# Patient Record
Sex: Female | Born: 1965
Health system: Southern US, Community
[De-identification: ages and names within clinical notes are randomized; demographics above are authoritative.]

## PROBLEM LIST (undated history)

## (undated) DIAGNOSIS — C50919 Malignant neoplasm of unspecified site of unspecified female breast: Secondary | ICD-10-CM

## (undated) DIAGNOSIS — Z90722 Acquired absence of ovaries, bilateral: Secondary | ICD-10-CM

## (undated) DIAGNOSIS — F419 Anxiety disorder, unspecified: Secondary | ICD-10-CM

## (undated) DIAGNOSIS — N870 Mild cervical dysplasia: Secondary | ICD-10-CM

## (undated) DIAGNOSIS — R87619 Unspecified abnormal cytological findings in specimens from cervix uteri: Secondary | ICD-10-CM

## (undated) DIAGNOSIS — C801 Malignant (primary) neoplasm, unspecified: Secondary | ICD-10-CM

## (undated) DIAGNOSIS — H9190 Unspecified hearing loss, unspecified ear: Secondary | ICD-10-CM

## (undated) DIAGNOSIS — H905 Unspecified sensorineural hearing loss: Secondary | ICD-10-CM

## (undated) DIAGNOSIS — IMO0002 Reserved for concepts with insufficient information to code with codable children: Secondary | ICD-10-CM

## (undated) DIAGNOSIS — Z95828 Presence of other vascular implants and grafts: Secondary | ICD-10-CM

## (undated) DIAGNOSIS — F329 Major depressive disorder, single episode, unspecified: Secondary | ICD-10-CM

## (undated) DIAGNOSIS — IMO0001 Reserved for inherently not codable concepts without codable children: Secondary | ICD-10-CM

## (undated) DIAGNOSIS — F32A Depression, unspecified: Secondary | ICD-10-CM

## (undated) DIAGNOSIS — Z923 Personal history of irradiation: Secondary | ICD-10-CM

## (undated) HISTORY — DX: Malignant (primary) neoplasm, unspecified: C80.1

## (undated) HISTORY — DX: Mild cervical dysplasia: N87.0

## (undated) HISTORY — PX: MASTECTOMY: SHX3

## (undated) HISTORY — DX: Reserved for inherently not codable concepts without codable children: IMO0001

## (undated) HISTORY — PX: OVARIAN CYST REMOVAL: SHX89

## (undated) HISTORY — PX: ABDOMINAL HYSTERECTOMY: SHX81

## (undated) HISTORY — DX: Unspecified sensorineural hearing loss: H90.5

## (undated) HISTORY — DX: Reserved for concepts with insufficient information to code with codable children: IMO0002

## (undated) HISTORY — PX: TUBAL LIGATION: SHX77

## (undated) HISTORY — DX: Acquired absence of ovaries, bilateral: Z90.722

## (undated) HISTORY — DX: Unspecified abnormal cytological findings in specimens from cervix uteri: R87.619

---

## 1997-01-12 DIAGNOSIS — C801 Malignant (primary) neoplasm, unspecified: Secondary | ICD-10-CM

## 1997-01-12 HISTORY — DX: Malignant (primary) neoplasm, unspecified: C80.1

## 1997-07-02 ENCOUNTER — Encounter: Admission: RE | Admit: 1997-07-02 | Discharge: 1997-07-02 | Payer: Self-pay | Admitting: Family Medicine

## 1997-07-06 ENCOUNTER — Emergency Department (HOSPITAL_COMMUNITY): Admission: EM | Admit: 1997-07-06 | Discharge: 1997-07-06 | Payer: Self-pay | Admitting: Emergency Medicine

## 1997-07-13 ENCOUNTER — Encounter: Admission: RE | Admit: 1997-07-13 | Discharge: 1997-07-13 | Payer: Self-pay | Admitting: Family Medicine

## 1997-08-31 ENCOUNTER — Emergency Department (HOSPITAL_COMMUNITY): Admission: EM | Admit: 1997-08-31 | Discharge: 1997-09-01 | Payer: Self-pay | Admitting: Emergency Medicine

## 1997-09-04 ENCOUNTER — Emergency Department (HOSPITAL_COMMUNITY): Admission: EM | Admit: 1997-09-04 | Discharge: 1997-09-04 | Payer: Self-pay | Admitting: Emergency Medicine

## 1997-09-13 ENCOUNTER — Encounter: Admission: RE | Admit: 1997-09-13 | Discharge: 1997-09-13 | Payer: Self-pay | Admitting: Family Medicine

## 1997-11-13 ENCOUNTER — Other Ambulatory Visit: Admission: RE | Admit: 1997-11-13 | Discharge: 1997-11-13 | Payer: Self-pay | Admitting: *Deleted

## 1997-11-20 ENCOUNTER — Ambulatory Visit (HOSPITAL_COMMUNITY): Admission: RE | Admit: 1997-11-20 | Discharge: 1997-11-20 | Payer: Self-pay | Admitting: *Deleted

## 1997-11-30 ENCOUNTER — Inpatient Hospital Stay (HOSPITAL_COMMUNITY): Admission: RE | Admit: 1997-11-30 | Discharge: 1997-12-02 | Payer: Self-pay | Admitting: *Deleted

## 1997-12-05 ENCOUNTER — Emergency Department (HOSPITAL_COMMUNITY): Admission: EM | Admit: 1997-12-05 | Discharge: 1997-12-05 | Payer: Self-pay

## 1997-12-10 ENCOUNTER — Encounter (HOSPITAL_COMMUNITY): Admission: RE | Admit: 1997-12-10 | Discharge: 1998-03-10 | Payer: Self-pay | Admitting: Plastic Surgery

## 1998-01-09 ENCOUNTER — Ambulatory Visit (HOSPITAL_COMMUNITY): Admission: RE | Admit: 1998-01-09 | Discharge: 1998-01-09 | Payer: Self-pay | Admitting: *Deleted

## 1998-01-11 ENCOUNTER — Encounter: Payer: Self-pay | Admitting: Hematology and Oncology

## 1998-01-11 ENCOUNTER — Inpatient Hospital Stay (HOSPITAL_COMMUNITY): Admission: AD | Admit: 1998-01-11 | Discharge: 1998-01-12 | Payer: Self-pay | Admitting: Hematology and Oncology

## 1998-01-23 ENCOUNTER — Inpatient Hospital Stay (HOSPITAL_COMMUNITY): Admission: EM | Admit: 1998-01-23 | Discharge: 1998-01-25 | Payer: Self-pay | Admitting: Hematology and Oncology

## 1998-01-23 ENCOUNTER — Encounter: Payer: Self-pay | Admitting: Hematology and Oncology

## 1998-01-24 ENCOUNTER — Encounter: Payer: Self-pay | Admitting: Hematology and Oncology

## 1998-03-25 ENCOUNTER — Encounter: Payer: Self-pay | Admitting: Hematology and Oncology

## 1998-03-25 ENCOUNTER — Inpatient Hospital Stay (HOSPITAL_COMMUNITY): Admission: EM | Admit: 1998-03-25 | Discharge: 1998-03-27 | Payer: Self-pay | Admitting: Emergency Medicine

## 1998-06-25 ENCOUNTER — Encounter: Payer: Self-pay | Admitting: Hematology and Oncology

## 1998-06-25 ENCOUNTER — Ambulatory Visit (HOSPITAL_COMMUNITY): Admission: RE | Admit: 1998-06-25 | Discharge: 1998-06-25 | Payer: Self-pay | Admitting: Hematology and Oncology

## 1998-07-02 ENCOUNTER — Ambulatory Visit (HOSPITAL_COMMUNITY): Admission: RE | Admit: 1998-07-02 | Discharge: 1998-07-02 | Payer: Self-pay | Admitting: Hematology and Oncology

## 1998-07-02 ENCOUNTER — Encounter: Payer: Self-pay | Admitting: Hematology and Oncology

## 1998-07-23 ENCOUNTER — Ambulatory Visit (HOSPITAL_BASED_OUTPATIENT_CLINIC_OR_DEPARTMENT_OTHER): Admission: RE | Admit: 1998-07-23 | Discharge: 1998-07-23 | Payer: Self-pay | Admitting: *Deleted

## 1998-09-25 ENCOUNTER — Ambulatory Visit (HOSPITAL_BASED_OUTPATIENT_CLINIC_OR_DEPARTMENT_OTHER): Admission: RE | Admit: 1998-09-25 | Discharge: 1998-09-25 | Payer: Self-pay | Admitting: Plastic Surgery

## 1998-10-01 ENCOUNTER — Encounter (HOSPITAL_COMMUNITY): Admission: RE | Admit: 1998-10-01 | Discharge: 1998-12-30 | Payer: Self-pay | Admitting: Plastic Surgery

## 1998-11-22 ENCOUNTER — Encounter: Admission: RE | Admit: 1998-11-22 | Discharge: 1998-11-22 | Payer: Self-pay | Admitting: Hematology and Oncology

## 1998-11-22 ENCOUNTER — Encounter: Payer: Self-pay | Admitting: Hematology and Oncology

## 1998-12-18 ENCOUNTER — Encounter: Admission: RE | Admit: 1998-12-18 | Discharge: 1998-12-18 | Payer: Self-pay | Admitting: Family Medicine

## 1998-12-18 ENCOUNTER — Other Ambulatory Visit: Admission: RE | Admit: 1998-12-18 | Discharge: 1999-01-27 | Payer: Self-pay | Admitting: *Deleted

## 1999-01-01 ENCOUNTER — Encounter: Admission: RE | Admit: 1999-01-01 | Discharge: 1999-01-01 | Payer: Self-pay | Admitting: Family Medicine

## 1999-01-23 ENCOUNTER — Encounter: Admission: RE | Admit: 1999-01-23 | Discharge: 1999-01-23 | Payer: Self-pay | Admitting: Hematology and Oncology

## 1999-01-23 ENCOUNTER — Encounter: Payer: Self-pay | Admitting: Hematology and Oncology

## 1999-01-27 ENCOUNTER — Encounter: Admission: RE | Admit: 1999-01-27 | Discharge: 1999-01-27 | Payer: Self-pay | Admitting: Family Medicine

## 1999-02-06 ENCOUNTER — Ambulatory Visit (HOSPITAL_BASED_OUTPATIENT_CLINIC_OR_DEPARTMENT_OTHER): Admission: RE | Admit: 1999-02-06 | Discharge: 1999-02-06 | Payer: Self-pay | Admitting: *Deleted

## 1999-02-06 ENCOUNTER — Encounter (INDEPENDENT_AMBULATORY_CARE_PROVIDER_SITE_OTHER): Payer: Self-pay | Admitting: Specialist

## 1999-02-15 ENCOUNTER — Emergency Department (HOSPITAL_COMMUNITY): Admission: EM | Admit: 1999-02-15 | Discharge: 1999-02-15 | Payer: Self-pay | Admitting: Emergency Medicine

## 1999-02-15 ENCOUNTER — Encounter: Payer: Self-pay | Admitting: Emergency Medicine

## 1999-03-12 ENCOUNTER — Ambulatory Visit (HOSPITAL_COMMUNITY): Admission: RE | Admit: 1999-03-12 | Discharge: 1999-03-12 | Payer: Self-pay | Admitting: Plastic Surgery

## 1999-04-14 ENCOUNTER — Encounter (HOSPITAL_COMMUNITY): Admission: RE | Admit: 1999-04-14 | Discharge: 1999-07-13 | Payer: Self-pay | Admitting: Plastic Surgery

## 1999-05-02 ENCOUNTER — Other Ambulatory Visit: Admission: RE | Admit: 1999-05-02 | Discharge: 1999-05-02 | Payer: Self-pay | Admitting: Family Medicine

## 1999-05-02 ENCOUNTER — Encounter: Admission: RE | Admit: 1999-05-02 | Discharge: 1999-05-02 | Payer: Self-pay | Admitting: Family Medicine

## 1999-06-10 ENCOUNTER — Ambulatory Visit (HOSPITAL_BASED_OUTPATIENT_CLINIC_OR_DEPARTMENT_OTHER): Admission: RE | Admit: 1999-06-10 | Discharge: 1999-06-10 | Payer: Self-pay | Admitting: Orthopedic Surgery

## 1999-06-11 ENCOUNTER — Emergency Department (HOSPITAL_COMMUNITY): Admission: EM | Admit: 1999-06-11 | Discharge: 1999-06-11 | Payer: Self-pay | Admitting: Emergency Medicine

## 1999-06-19 ENCOUNTER — Encounter: Admission: RE | Admit: 1999-06-19 | Discharge: 1999-09-17 | Payer: Self-pay | Admitting: Orthopedic Surgery

## 1999-09-24 ENCOUNTER — Encounter: Admission: RE | Admit: 1999-09-24 | Discharge: 1999-09-24 | Payer: Self-pay | Admitting: *Deleted

## 1999-10-03 ENCOUNTER — Encounter: Payer: Self-pay | Admitting: Hematology and Oncology

## 1999-10-03 ENCOUNTER — Encounter: Admission: RE | Admit: 1999-10-03 | Discharge: 1999-10-03 | Payer: Self-pay | Admitting: Hematology and Oncology

## 1999-11-19 ENCOUNTER — Encounter: Admission: RE | Admit: 1999-11-19 | Discharge: 1999-11-19 | Payer: Self-pay | Admitting: Family Medicine

## 1999-11-24 ENCOUNTER — Encounter: Admission: RE | Admit: 1999-11-24 | Discharge: 1999-11-24 | Payer: Self-pay | Admitting: *Deleted

## 1999-11-26 ENCOUNTER — Encounter: Admission: RE | Admit: 1999-11-26 | Discharge: 1999-11-26 | Payer: Self-pay | Admitting: Family Medicine

## 1999-12-02 ENCOUNTER — Encounter: Admission: RE | Admit: 1999-12-02 | Discharge: 1999-12-02 | Payer: Self-pay | Admitting: *Deleted

## 1999-12-10 ENCOUNTER — Encounter: Admission: RE | Admit: 1999-12-10 | Discharge: 1999-12-10 | Payer: Self-pay | Admitting: Family Medicine

## 2000-02-09 ENCOUNTER — Emergency Department (HOSPITAL_COMMUNITY): Admission: EM | Admit: 2000-02-09 | Discharge: 2000-02-09 | Payer: Self-pay

## 2000-08-10 ENCOUNTER — Ambulatory Visit (HOSPITAL_COMMUNITY): Admission: RE | Admit: 2000-08-10 | Discharge: 2000-08-10 | Payer: Self-pay

## 2000-08-10 ENCOUNTER — Encounter: Payer: Self-pay | Admitting: Hematology and Oncology

## 2000-09-11 ENCOUNTER — Encounter: Payer: Self-pay | Admitting: Emergency Medicine

## 2000-09-11 ENCOUNTER — Emergency Department (HOSPITAL_COMMUNITY): Admission: EM | Admit: 2000-09-11 | Discharge: 2000-09-11 | Payer: Self-pay | Admitting: Emergency Medicine

## 2000-09-13 ENCOUNTER — Emergency Department (HOSPITAL_COMMUNITY): Admission: EM | Admit: 2000-09-13 | Discharge: 2000-09-14 | Payer: Self-pay | Admitting: Emergency Medicine

## 2000-10-13 ENCOUNTER — Encounter: Payer: Self-pay | Admitting: Hematology and Oncology

## 2000-10-13 ENCOUNTER — Encounter: Admission: RE | Admit: 2000-10-13 | Discharge: 2000-10-13 | Payer: Self-pay | Admitting: Hematology and Oncology

## 2000-11-09 ENCOUNTER — Ambulatory Visit (HOSPITAL_COMMUNITY): Admission: RE | Admit: 2000-11-09 | Discharge: 2000-11-09 | Payer: Self-pay | Admitting: Obstetrics and Gynecology

## 2000-11-09 ENCOUNTER — Encounter (INDEPENDENT_AMBULATORY_CARE_PROVIDER_SITE_OTHER): Payer: Self-pay | Admitting: Specialist

## 2000-11-09 ENCOUNTER — Encounter (INDEPENDENT_AMBULATORY_CARE_PROVIDER_SITE_OTHER): Payer: Self-pay

## 2000-12-01 ENCOUNTER — Encounter: Payer: Self-pay | Admitting: Obstetrics and Gynecology

## 2000-12-02 ENCOUNTER — Observation Stay (HOSPITAL_COMMUNITY): Admission: AD | Admit: 2000-12-02 | Discharge: 2000-12-03 | Payer: Self-pay | Admitting: Obstetrics and Gynecology

## 2000-12-02 ENCOUNTER — Encounter: Payer: Self-pay | Admitting: Obstetrics and Gynecology

## 2001-01-12 DIAGNOSIS — N870 Mild cervical dysplasia: Secondary | ICD-10-CM

## 2001-01-12 HISTORY — DX: Mild cervical dysplasia: N87.0

## 2001-07-12 ENCOUNTER — Encounter (INDEPENDENT_AMBULATORY_CARE_PROVIDER_SITE_OTHER): Payer: Self-pay | Admitting: *Deleted

## 2001-07-12 LAB — CONVERTED CEMR LAB

## 2001-08-12 ENCOUNTER — Encounter (INDEPENDENT_AMBULATORY_CARE_PROVIDER_SITE_OTHER): Payer: Self-pay | Admitting: *Deleted

## 2001-08-12 ENCOUNTER — Encounter: Admission: RE | Admit: 2001-08-12 | Discharge: 2001-08-12 | Payer: Self-pay | Admitting: Family Medicine

## 2001-08-22 ENCOUNTER — Encounter: Admission: RE | Admit: 2001-08-22 | Discharge: 2001-08-22 | Payer: Self-pay | Admitting: Family Medicine

## 2001-10-18 ENCOUNTER — Encounter: Admission: RE | Admit: 2001-10-18 | Discharge: 2001-10-18 | Payer: Self-pay | Admitting: Family Medicine

## 2001-11-02 ENCOUNTER — Emergency Department (HOSPITAL_COMMUNITY): Admission: EM | Admit: 2001-11-02 | Discharge: 2001-11-02 | Payer: Self-pay | Admitting: Emergency Medicine

## 2001-11-03 ENCOUNTER — Inpatient Hospital Stay (HOSPITAL_COMMUNITY): Admission: EM | Admit: 2001-11-03 | Discharge: 2001-11-06 | Payer: Self-pay | Admitting: Family Medicine

## 2001-11-03 ENCOUNTER — Encounter: Payer: Self-pay | Admitting: Emergency Medicine

## 2001-11-03 ENCOUNTER — Encounter: Payer: Self-pay | Admitting: Obstetrics and Gynecology

## 2001-11-04 ENCOUNTER — Encounter: Payer: Self-pay | Admitting: Family Medicine

## 2001-11-14 ENCOUNTER — Encounter: Admission: RE | Admit: 2001-11-14 | Discharge: 2001-11-14 | Payer: Self-pay | Admitting: Sports Medicine

## 2001-11-28 ENCOUNTER — Encounter: Admission: RE | Admit: 2001-11-28 | Discharge: 2001-11-28 | Payer: Self-pay | Admitting: Family Medicine

## 2001-12-20 ENCOUNTER — Encounter: Payer: Self-pay | Admitting: Otolaryngology

## 2001-12-20 ENCOUNTER — Encounter: Admission: RE | Admit: 2001-12-20 | Discharge: 2001-12-20 | Payer: Self-pay | Admitting: Otolaryngology

## 2002-03-06 ENCOUNTER — Ambulatory Visit (HOSPITAL_COMMUNITY): Admission: RE | Admit: 2002-03-06 | Discharge: 2002-03-06 | Payer: Self-pay | Admitting: Oncology

## 2002-03-06 ENCOUNTER — Encounter: Payer: Self-pay | Admitting: Oncology

## 2002-06-12 ENCOUNTER — Encounter: Admission: RE | Admit: 2002-06-12 | Discharge: 2002-06-12 | Payer: Self-pay | Admitting: *Deleted

## 2002-08-27 ENCOUNTER — Encounter: Payer: Self-pay | Admitting: *Deleted

## 2002-08-27 ENCOUNTER — Emergency Department (HOSPITAL_COMMUNITY): Admission: EM | Admit: 2002-08-27 | Discharge: 2002-08-28 | Payer: Self-pay | Admitting: Emergency Medicine

## 2002-08-30 ENCOUNTER — Encounter: Payer: Self-pay | Admitting: Oncology

## 2002-08-30 ENCOUNTER — Ambulatory Visit (HOSPITAL_COMMUNITY): Admission: RE | Admit: 2002-08-30 | Discharge: 2002-08-30 | Payer: Self-pay | Admitting: Oncology

## 2002-08-31 ENCOUNTER — Encounter: Payer: Self-pay | Admitting: Oncology

## 2002-08-31 ENCOUNTER — Ambulatory Visit (HOSPITAL_COMMUNITY): Admission: RE | Admit: 2002-08-31 | Discharge: 2002-08-31 | Payer: Self-pay | Admitting: Oncology

## 2002-09-06 ENCOUNTER — Ambulatory Visit (HOSPITAL_COMMUNITY): Admission: RE | Admit: 2002-09-06 | Discharge: 2002-09-06 | Payer: Self-pay | Admitting: Oncology

## 2002-09-06 ENCOUNTER — Encounter: Payer: Self-pay | Admitting: Oncology

## 2002-09-08 ENCOUNTER — Observation Stay (HOSPITAL_COMMUNITY): Admission: RE | Admit: 2002-09-08 | Discharge: 2002-09-09 | Payer: Self-pay | Admitting: *Deleted

## 2002-09-08 ENCOUNTER — Encounter (INDEPENDENT_AMBULATORY_CARE_PROVIDER_SITE_OTHER): Payer: Self-pay | Admitting: Specialist

## 2002-09-10 ENCOUNTER — Encounter: Payer: Self-pay | Admitting: Emergency Medicine

## 2002-09-10 ENCOUNTER — Emergency Department (HOSPITAL_COMMUNITY): Admission: EM | Admit: 2002-09-10 | Discharge: 2002-09-10 | Payer: Self-pay | Admitting: Emergency Medicine

## 2003-03-08 ENCOUNTER — Encounter: Admission: RE | Admit: 2003-03-08 | Discharge: 2003-03-08 | Payer: Self-pay | Admitting: Oncology

## 2003-03-23 ENCOUNTER — Other Ambulatory Visit: Admission: RE | Admit: 2003-03-23 | Discharge: 2003-03-23 | Payer: Self-pay | Admitting: Obstetrics and Gynecology

## 2003-04-26 ENCOUNTER — Emergency Department (HOSPITAL_COMMUNITY): Admission: EM | Admit: 2003-04-26 | Discharge: 2003-04-27 | Payer: Self-pay | Admitting: Emergency Medicine

## 2003-05-08 ENCOUNTER — Emergency Department (HOSPITAL_COMMUNITY): Admission: EM | Admit: 2003-05-08 | Discharge: 2003-05-09 | Payer: Self-pay | Admitting: Emergency Medicine

## 2003-05-12 ENCOUNTER — Emergency Department (HOSPITAL_COMMUNITY): Admission: EM | Admit: 2003-05-12 | Discharge: 2003-05-13 | Payer: Self-pay | Admitting: Emergency Medicine

## 2003-05-15 ENCOUNTER — Inpatient Hospital Stay (HOSPITAL_COMMUNITY): Admission: AD | Admit: 2003-05-15 | Discharge: 2003-05-17 | Payer: Self-pay | Admitting: Obstetrics and Gynecology

## 2003-05-15 ENCOUNTER — Emergency Department (HOSPITAL_COMMUNITY): Admission: EM | Admit: 2003-05-15 | Discharge: 2003-05-15 | Payer: Self-pay | Admitting: Emergency Medicine

## 2003-05-16 ENCOUNTER — Encounter (INDEPENDENT_AMBULATORY_CARE_PROVIDER_SITE_OTHER): Payer: Self-pay | Admitting: *Deleted

## 2004-05-01 ENCOUNTER — Ambulatory Visit: Payer: Self-pay | Admitting: Oncology

## 2004-05-16 ENCOUNTER — Encounter: Admission: RE | Admit: 2004-05-16 | Discharge: 2004-05-16 | Payer: Self-pay | Admitting: Oncology

## 2004-07-08 ENCOUNTER — Ambulatory Visit: Payer: Self-pay | Admitting: Oncology

## 2004-08-08 ENCOUNTER — Emergency Department (HOSPITAL_COMMUNITY): Admission: EM | Admit: 2004-08-08 | Discharge: 2004-08-08 | Payer: Self-pay | Admitting: Emergency Medicine

## 2004-09-04 ENCOUNTER — Emergency Department (HOSPITAL_COMMUNITY): Admission: EM | Admit: 2004-09-04 | Discharge: 2004-09-04 | Payer: Self-pay | Admitting: *Deleted

## 2004-11-23 ENCOUNTER — Emergency Department (HOSPITAL_COMMUNITY): Admission: EM | Admit: 2004-11-23 | Discharge: 2004-11-23 | Payer: Self-pay | Admitting: Emergency Medicine

## 2005-01-13 ENCOUNTER — Ambulatory Visit: Payer: Self-pay | Admitting: Oncology

## 2005-02-13 ENCOUNTER — Ambulatory Visit (HOSPITAL_COMMUNITY): Admission: RE | Admit: 2005-02-13 | Discharge: 2005-02-13 | Payer: Self-pay | Admitting: Internal Medicine

## 2005-02-13 ENCOUNTER — Ambulatory Visit: Payer: Self-pay | Admitting: Internal Medicine

## 2005-02-27 ENCOUNTER — Ambulatory Visit: Payer: Self-pay | Admitting: Internal Medicine

## 2005-02-27 ENCOUNTER — Inpatient Hospital Stay (HOSPITAL_COMMUNITY): Admission: AD | Admit: 2005-02-27 | Discharge: 2005-03-02 | Payer: Self-pay | Admitting: Internal Medicine

## 2005-03-06 ENCOUNTER — Ambulatory Visit: Payer: Self-pay | Admitting: Internal Medicine

## 2005-05-09 ENCOUNTER — Emergency Department (HOSPITAL_COMMUNITY): Admission: EM | Admit: 2005-05-09 | Discharge: 2005-05-10 | Payer: Self-pay | Admitting: Emergency Medicine

## 2005-05-12 ENCOUNTER — Emergency Department (HOSPITAL_COMMUNITY): Admission: EM | Admit: 2005-05-12 | Discharge: 2005-05-13 | Payer: Self-pay | Admitting: Emergency Medicine

## 2005-05-18 ENCOUNTER — Encounter: Admission: RE | Admit: 2005-05-18 | Discharge: 2005-05-18 | Payer: Self-pay | Admitting: Sports Medicine

## 2005-05-18 ENCOUNTER — Ambulatory Visit: Payer: Self-pay | Admitting: Family Medicine

## 2005-07-14 ENCOUNTER — Ambulatory Visit: Payer: Self-pay | Admitting: Oncology

## 2005-11-23 ENCOUNTER — Ambulatory Visit: Payer: Self-pay | Admitting: Sports Medicine

## 2006-01-15 ENCOUNTER — Ambulatory Visit: Payer: Self-pay | Admitting: Oncology

## 2006-01-20 LAB — CBC WITH DIFFERENTIAL/PLATELET
Basophils Absolute: 0 10*3/uL (ref 0.0–0.1)
EOS%: 2.3 % (ref 0.0–7.0)
HGB: 12 g/dL (ref 11.6–15.9)
LYMPH%: 25.4 % (ref 14.0–48.0)
MCH: 30.3 pg (ref 26.0–34.0)
MCV: 86.5 fL (ref 81.0–101.0)
MONO%: 5.1 % (ref 0.0–13.0)
NEUT%: 66.8 % (ref 39.6–76.8)
Platelets: 255 10*3/uL (ref 145–400)
RDW: 12.5 % (ref 11.3–14.5)

## 2006-01-20 LAB — COMPREHENSIVE METABOLIC PANEL
AST: 19 U/L (ref 0–37)
Alkaline Phosphatase: 99 U/L (ref 39–117)
BUN: 7 mg/dL (ref 6–23)
Creatinine, Ser: 0.8 mg/dL (ref 0.40–1.20)
Glucose, Bld: 83 mg/dL (ref 70–99)
Potassium: 3.8 mEq/L (ref 3.5–5.3)
Total Bilirubin: 1.6 mg/dL — ABNORMAL HIGH (ref 0.3–1.2)

## 2006-02-02 ENCOUNTER — Encounter: Admission: RE | Admit: 2006-02-02 | Discharge: 2006-02-02 | Payer: Self-pay | Admitting: Oncology

## 2006-03-11 DIAGNOSIS — N879 Dysplasia of cervix uteri, unspecified: Secondary | ICD-10-CM | POA: Insufficient documentation

## 2006-03-11 DIAGNOSIS — H9190 Unspecified hearing loss, unspecified ear: Secondary | ICD-10-CM | POA: Insufficient documentation

## 2006-03-11 DIAGNOSIS — N946 Dysmenorrhea, unspecified: Secondary | ICD-10-CM | POA: Insufficient documentation

## 2006-03-12 ENCOUNTER — Encounter (INDEPENDENT_AMBULATORY_CARE_PROVIDER_SITE_OTHER): Payer: Self-pay | Admitting: *Deleted

## 2007-01-17 ENCOUNTER — Ambulatory Visit: Payer: Self-pay | Admitting: Oncology

## 2007-01-25 ENCOUNTER — Other Ambulatory Visit: Admission: RE | Admit: 2007-01-25 | Discharge: 2007-01-25 | Payer: Self-pay | Admitting: Family Medicine

## 2007-01-25 ENCOUNTER — Encounter (INDEPENDENT_AMBULATORY_CARE_PROVIDER_SITE_OTHER): Payer: Self-pay | Admitting: Family Medicine

## 2007-01-25 ENCOUNTER — Ambulatory Visit: Payer: Self-pay | Admitting: Family Medicine

## 2007-01-25 DIAGNOSIS — G4700 Insomnia, unspecified: Secondary | ICD-10-CM

## 2007-01-25 DIAGNOSIS — R42 Dizziness and giddiness: Secondary | ICD-10-CM

## 2007-01-26 ENCOUNTER — Encounter: Admission: RE | Admit: 2007-01-26 | Discharge: 2007-01-26 | Payer: Self-pay | Admitting: Family Medicine

## 2007-01-30 ENCOUNTER — Encounter (INDEPENDENT_AMBULATORY_CARE_PROVIDER_SITE_OTHER): Payer: Self-pay | Admitting: Family Medicine

## 2007-04-04 ENCOUNTER — Emergency Department (HOSPITAL_COMMUNITY): Admission: EM | Admit: 2007-04-04 | Discharge: 2007-04-05 | Payer: Self-pay | Admitting: Emergency Medicine

## 2007-04-23 LAB — CONVERTED CEMR LAB: Pap Smear: ABNORMAL

## 2007-04-27 ENCOUNTER — Ambulatory Visit: Payer: Self-pay | Admitting: Oncology

## 2007-04-27 ENCOUNTER — Encounter (INDEPENDENT_AMBULATORY_CARE_PROVIDER_SITE_OTHER): Payer: Self-pay | Admitting: Family Medicine

## 2007-04-27 LAB — COMPREHENSIVE METABOLIC PANEL
AST: 16 U/L (ref 0–37)
Albumin: 4.4 g/dL (ref 3.5–5.2)
Alkaline Phosphatase: 86 U/L (ref 39–117)
BUN: 8 mg/dL (ref 6–23)
Potassium: 3.8 mEq/L (ref 3.5–5.3)
Sodium: 140 mEq/L (ref 135–145)
Total Bilirubin: 1.3 mg/dL — ABNORMAL HIGH (ref 0.3–1.2)
Total Protein: 7 g/dL (ref 6.0–8.3)

## 2007-04-27 LAB — CBC WITH DIFFERENTIAL/PLATELET
EOS%: 1.7 % (ref 0.0–7.0)
MCH: 29.9 pg (ref 26.0–34.0)
MCV: 85.2 fL (ref 81.0–101.0)
MONO%: 4.8 % (ref 0.0–13.0)
RBC: 4.24 10*6/uL (ref 3.70–5.32)
RDW: 12.6 % (ref 11.3–14.5)

## 2007-04-29 ENCOUNTER — Encounter: Admission: RE | Admit: 2007-04-29 | Discharge: 2007-04-29 | Payer: Self-pay | Admitting: Oncology

## 2007-04-29 LAB — CONVERTED CEMR LAB: Pap Smear: ABNORMAL

## 2007-05-09 ENCOUNTER — Encounter: Admission: RE | Admit: 2007-05-09 | Discharge: 2007-05-09 | Payer: Self-pay | Admitting: Oncology

## 2007-05-18 ENCOUNTER — Encounter (INDEPENDENT_AMBULATORY_CARE_PROVIDER_SITE_OTHER): Payer: Self-pay | Admitting: Family Medicine

## 2007-10-19 ENCOUNTER — Emergency Department (HOSPITAL_COMMUNITY): Admission: EM | Admit: 2007-10-19 | Discharge: 2007-10-19 | Payer: Self-pay | Admitting: Emergency Medicine

## 2007-11-04 ENCOUNTER — Ambulatory Visit: Payer: Self-pay | Admitting: Oncology

## 2007-12-21 ENCOUNTER — Ambulatory Visit: Payer: Self-pay | Admitting: Oncology

## 2008-01-23 ENCOUNTER — Ambulatory Visit: Payer: Self-pay | Admitting: Family Medicine

## 2008-02-18 ENCOUNTER — Emergency Department (HOSPITAL_COMMUNITY): Admission: EM | Admit: 2008-02-18 | Discharge: 2008-02-19 | Payer: Self-pay | Admitting: Emergency Medicine

## 2008-03-12 ENCOUNTER — Telehealth (INDEPENDENT_AMBULATORY_CARE_PROVIDER_SITE_OTHER): Payer: Self-pay | Admitting: Family Medicine

## 2008-03-12 ENCOUNTER — Ambulatory Visit: Payer: Self-pay | Admitting: Oncology

## 2008-06-04 ENCOUNTER — Emergency Department (HOSPITAL_COMMUNITY): Admission: EM | Admit: 2008-06-04 | Discharge: 2008-06-04 | Payer: Self-pay | Admitting: Emergency Medicine

## 2008-06-25 ENCOUNTER — Telehealth (INDEPENDENT_AMBULATORY_CARE_PROVIDER_SITE_OTHER): Payer: Self-pay | Admitting: Family Medicine

## 2008-06-26 ENCOUNTER — Encounter: Payer: Self-pay | Admitting: *Deleted

## 2008-06-26 ENCOUNTER — Ambulatory Visit: Payer: Self-pay | Admitting: Family Medicine

## 2008-06-26 ENCOUNTER — Encounter (INDEPENDENT_AMBULATORY_CARE_PROVIDER_SITE_OTHER): Payer: Self-pay | Admitting: Family Medicine

## 2008-06-29 ENCOUNTER — Ambulatory Visit: Payer: Self-pay | Admitting: Oncology

## 2008-07-03 ENCOUNTER — Encounter: Payer: Self-pay | Admitting: Family Medicine

## 2008-07-03 LAB — COMPREHENSIVE METABOLIC PANEL
ALT: 20 U/L (ref 0–35)
AST: 15 U/L (ref 0–37)
Albumin: 4.2 g/dL (ref 3.5–5.2)
Alkaline Phosphatase: 81 U/L (ref 39–117)
BUN: 9 mg/dL (ref 6–23)
CO2: 26 mEq/L (ref 19–32)
Calcium: 9.6 mg/dL (ref 8.4–10.5)
Chloride: 105 mEq/L (ref 96–112)
Creatinine, Ser: 0.98 mg/dL (ref 0.40–1.20)
Glucose, Bld: 88 mg/dL (ref 70–99)
Potassium: 3.7 mEq/L (ref 3.5–5.3)
Sodium: 142 mEq/L (ref 135–145)
Total Bilirubin: 2 mg/dL — ABNORMAL HIGH (ref 0.3–1.2)
Total Protein: 6.9 g/dL (ref 6.0–8.3)

## 2008-07-03 LAB — CBC WITH DIFFERENTIAL/PLATELET
EOS%: 2.6 % (ref 0.0–7.0)
Eosinophils Absolute: 0.1 10*3/uL (ref 0.0–0.5)
MCH: 31.4 pg (ref 25.1–34.0)
MCV: 86.7 fL (ref 79.5–101.0)
MONO%: 7.6 % (ref 0.0–14.0)
NEUT#: 2.3 10*3/uL (ref 1.5–6.5)
RBC: 4.1 10*6/uL (ref 3.70–5.45)
RDW: 12.6 % (ref 11.2–14.5)

## 2008-07-05 ENCOUNTER — Encounter: Admission: RE | Admit: 2008-07-05 | Discharge: 2008-07-05 | Payer: Self-pay | Admitting: Oncology

## 2009-01-31 ENCOUNTER — Ambulatory Visit: Payer: Self-pay | Admitting: Family Medicine

## 2009-01-31 ENCOUNTER — Encounter: Payer: Self-pay | Admitting: *Deleted

## 2009-02-03 ENCOUNTER — Emergency Department (HOSPITAL_COMMUNITY): Admission: EM | Admit: 2009-02-03 | Discharge: 2009-02-03 | Payer: Self-pay | Admitting: Emergency Medicine

## 2009-02-05 ENCOUNTER — Telehealth: Payer: Self-pay | Admitting: Family Medicine

## 2009-02-05 ENCOUNTER — Encounter: Payer: Self-pay | Admitting: Family Medicine

## 2009-02-05 ENCOUNTER — Ambulatory Visit: Payer: Self-pay | Admitting: Family Medicine

## 2009-02-05 DIAGNOSIS — R1110 Vomiting, unspecified: Secondary | ICD-10-CM

## 2009-02-05 DIAGNOSIS — R1084 Generalized abdominal pain: Secondary | ICD-10-CM | POA: Insufficient documentation

## 2009-02-06 ENCOUNTER — Encounter: Payer: Self-pay | Admitting: Family Medicine

## 2009-02-06 LAB — CONVERTED CEMR LAB
ALT: 22 units/L (ref 0–35)
AST: 20 units/L (ref 0–37)
Albumin: 4.4 g/dL (ref 3.5–5.2)
Alkaline Phosphatase: 82 units/L (ref 39–117)
Basophils Absolute: 0 10*3/uL (ref 0.0–0.1)
Basophils Relative: 0 % (ref 0–1)
Glucose, Bld: 95 mg/dL (ref 70–99)
Hemoglobin: 12.1 g/dL (ref 12.0–15.0)
Lymphocytes Relative: 36 % (ref 12–46)
MCHC: 33.7 g/dL (ref 30.0–36.0)
Monocytes Absolute: 0.3 10*3/uL (ref 0.1–1.0)
Neutro Abs: 2.5 10*3/uL (ref 1.7–7.7)
Neutrophils Relative %: 57 % (ref 43–77)
Potassium: 3.5 meq/L (ref 3.5–5.3)
RDW: 12.9 % (ref 11.5–15.5)
Sodium: 142 meq/L (ref 135–145)
Total Bilirubin: 1.4 mg/dL — ABNORMAL HIGH (ref 0.3–1.2)
Total Protein: 7 g/dL (ref 6.0–8.3)

## 2009-04-25 ENCOUNTER — Ambulatory Visit: Payer: Self-pay | Admitting: Family Medicine

## 2009-04-25 ENCOUNTER — Encounter: Payer: Self-pay | Admitting: *Deleted

## 2009-04-25 ENCOUNTER — Encounter: Payer: Self-pay | Admitting: Family Medicine

## 2009-07-01 ENCOUNTER — Ambulatory Visit: Payer: Self-pay | Admitting: Oncology

## 2009-10-02 ENCOUNTER — Emergency Department (HOSPITAL_COMMUNITY): Admission: EM | Admit: 2009-10-02 | Discharge: 2009-10-02 | Payer: Self-pay | Admitting: Emergency Medicine

## 2009-10-07 ENCOUNTER — Ambulatory Visit: Payer: Self-pay | Admitting: Family Medicine

## 2009-10-07 DIAGNOSIS — M654 Radial styloid tenosynovitis [de Quervain]: Secondary | ICD-10-CM

## 2009-11-10 ENCOUNTER — Emergency Department (HOSPITAL_COMMUNITY): Admission: EM | Admit: 2009-11-10 | Discharge: 2009-11-10 | Payer: Self-pay | Admitting: Emergency Medicine

## 2009-12-18 ENCOUNTER — Emergency Department (HOSPITAL_COMMUNITY)
Admission: EM | Admit: 2009-12-18 | Discharge: 2009-12-19 | Payer: Self-pay | Source: Home / Self Care | Admitting: Emergency Medicine

## 2009-12-30 ENCOUNTER — Emergency Department (HOSPITAL_COMMUNITY)
Admission: EM | Admit: 2009-12-30 | Discharge: 2009-12-30 | Payer: Self-pay | Source: Home / Self Care | Admitting: Emergency Medicine

## 2010-01-21 ENCOUNTER — Encounter: Payer: Self-pay | Admitting: Family Medicine

## 2010-01-21 ENCOUNTER — Ambulatory Visit
Admission: RE | Admit: 2010-01-21 | Discharge: 2010-01-21 | Payer: Self-pay | Source: Home / Self Care | Attending: Family Medicine | Admitting: Family Medicine

## 2010-01-21 DIAGNOSIS — S335XXA Sprain of ligaments of lumbar spine, initial encounter: Secondary | ICD-10-CM | POA: Insufficient documentation

## 2010-02-02 ENCOUNTER — Encounter: Payer: Self-pay | Admitting: Oncology

## 2010-02-02 ENCOUNTER — Encounter: Payer: Self-pay | Admitting: Sports Medicine

## 2010-02-03 ENCOUNTER — Encounter: Payer: Self-pay | Admitting: Oncology

## 2010-02-11 NOTE — Miscellaneous (Signed)
Summary: wants to be hospitalized  I don't see how the patient meets hospitalization criteria......Marland KitchenMarisue Ivan, MD  Clinical Lists Changes she states she has changed her mind & wants to be hospitalized. to pcp as I do not see that in OV.Golden Circle RN  February 05, 2009 12:13 PM  Appended Document: wants to be hospitalized Pt called, explained that she does not meet Hospitalization criteria for admit, pt stated that she would find another way.

## 2010-02-11 NOTE — Miscellaneous (Signed)
Summary: sinus infection?  Clinical Lists Changes c/o sinus infection, body aches & general malaise. work in at 3. interprestor scheduled (sign language).Golden Circle RN  January 31, 2009 9:22 AM

## 2010-02-11 NOTE — Miscellaneous (Signed)
Summary: c/o pain  Clinical Lists Changes started last night at work. A metal object hit me in that area. pain in  L lower abd. Reported it to her supervisor. last bm was yesterday. took nothing. It happened again this am. pain on & off. 5/10. does not have a uterus. wants to be seen asap. told her I will try to get an interpretor. Female only. I called for a sign language interpretor. they will call me back if they can get one. then I will call the pt back.Marland KitchenMarland KitchenGolden Circle RN  April 25, 2009 2:08 PM Jeanice Lim will be here at 3pm today to interprete. pt informed. Marland KitchenGolden Circle RN  April 25, 2009 2:29 PM   I have no problem seeing her as a work in later today. Thanks, Jamie Brookes MD  April 29, 2009 1:23 PM  will call her & make f/u appt per ov notes of 04/25/09.Golden Circle RN  April 29, 2009 2:48 PM   LM for her to call back. I want to know how she is doing & make a f/u with pcp.Golden Circle RN  April 30, 2009 10:11 AM  she states the pain is gone. states the md did not tell her she had to come back for a f/u. told her to call if it started hurting again.Golden Circle RN  April 30, 2009 2:47 PM

## 2010-02-11 NOTE — Letter (Signed)
Summary: Results Follow-up Letter  [facility]  [address]   [place], [place] [address]   Phone: [phone]  Fax: [phone]    02/06/2009  [address] [place], [place]  [address]  Dear Ms. Tillman Abide,   Attached are the results of your recent test(s):  All of your recent labs were normal and do not explain your symptoms.  It is possible that this is a viral illness that will be self-limited.  Please follow up with your primary doctor if your symptoms do not resolve.  Sincerely,  Marisue Ivan  MD [facility]

## 2010-02-11 NOTE — Assessment & Plan Note (Signed)
Summary: [name] tendonitis, FMLA paperwork/eo   Vital Signs:  Patient profile:   45 year old female Height:      66.5 inches Weight:      124 pounds BMI:     19.79 Temp:     97.9 degrees F oral Pulse rate:   64 / minute BP sitting:   117 / 77  (left arm) Cuff size:   regular  Vitals Entered By: Tessie Fass CMA (October 07, 2009 11:27 AM)  Primary Care Provider:  Jamie Brookes MD  CC:  [name].  History of Present Illness: [name] Tendenitis, Pt started having some tendonitis pain at work on 10-02-09. She went to the ED and was given a brace and Mobic but she didn't get the Rx filled because she didn't have the money. X-ray showed no fracture and dislocation. Pt continues to have throbbing pain 6/10. She comes in with her sign language interpretor. No injury, no falls, no erythema  Allergies (verified): 1)  Benadryl 2)  Phenergan  Social History: Pt is deaf.; Lives with fiance and 2 children; Occ alcohol.; No smoking ever  Review of Systems        vitals reviewed and pertinent negatives and positives seen in HPI   Physical Exam  General:  Well-developed,well-nourished,in no acute distress; alert,appropriate and cooperative throughout examination Msk:  pt has wrist in brace at first, tenderness over left lateral wrist. No joint swelling, no joint warmth, pt will not make fist or do test for de quervains  becaue of pain   Wrist/Hand Exam  General:    Well-developed, well-nourished, in no acute distress; alert and oriented x 3.    Skin:    Intact with no erythema; no scarring.    Inspection:    Inspection is normal.    Palpation:    tenderness L-wrist:   Wrist Exam:    Right:    Inspection:  Normal    Left:    Inspection:  Normal   Impression & Recommendations:  Problem # 1:  DE QUERVAIN'S TENOSYNOVITIS, LEFT WRIST (ICD-727.04) Assessment New Pt developed pain 10-02-09, went to ED. Diagnosed with tendonitis in ED, given a brace, given  Rx for Mobic (Pt did not have the money to pick it up), Pt has been using ice and icy hot. Still having lots of pain and throbbing. Has FMLA papers to fill out. Hopes to go back to work on 10-14-09. Pt refuses injections. Spoke with Sports Medicine Fellow.   Orders: [facility]- Est Level  3 ([address])  Complete Medication List: 1)  Flonase 50 Mcg/act Susp (Fluticasone propionate) .... 2 sprays each nostril daily for 10 days or until symptoms improved 2)  Tigan 300 Mg Caps (Trimethobenzamide hcl) .Marland Kitchen.. 1 tablet by mouth three times a day as needed for nausea and vomiting disp: generic 3)  Diclofenac Sodium 75 Mg Tbec (Diclofenac sodium) .Marland Kitchen.. 1 by mouth two times a day with food  Patient Instructions: 1)  You have De Quervain's tendonitis in the left wrist.  2)  Use Aleve every 8 hours and continue to wear your brace.  3)  Let us know if you want the shot in the future.  4)  You are ok to go back to work on Oct 3.

## 2010-02-11 NOTE — Letter (Signed)
Summary: Out of Work  All     ,     Phone:   Fax:     April 25, 2009   Employee:  TRINIDAD INGLE    To Whom It May Concern:   For Medical reasons, please excuse the above named employee from work for the following dates:  Start:   04/25/2009  End:   04/28/2009  Patient may work, however, she is not allowed to lift over 10 pounds, or do any repetitive movement.  She may return to full duties on 04/29/2009 if symptoms have resolved.  If you need additional information, please feel free to contact our office.         Sincerely,    Tinnie Gens MD

## 2010-02-11 NOTE — Assessment & Plan Note (Signed)
Summary: SEE TRIAGE NOTES/EO   Vital Signs:  Patient profile:   45 year old female Height:      66.5 inches Weight:      126.3 pounds BMI:     20.15 Temp:     97.7 degrees F oral Pulse rate:   50 / minute BP sitting:   116 / 73  (left arm) Cuff size:   regular  Vitals Entered By: Gladstone Pih (April 25, 2009 3:03 PM) CC: C/O LLQ pain since last night when pushing cart at work, Abdominal Pain Is Patient Diabetic? No Pain Assessment Patient in pain? yes     Location: abdomen Type: sharp Comments Last BM last night-stated normal   Primary Care Provider:  Jamie Brookes MD  CC:  C/O LLQ pain since last night when pushing cart at work and Abdominal Pain.  History of Present Illness:       This is a 45 year old woman who presents with Abdominal Pain.  The patient denies nausea and vomiting.  The location of the pain is left lower quadrant.  The patient denies the following symptoms: fever and weight loss.  The pain is worse with movement.  The pain is better with rest.  Pt. lifts large boxes at work.  Pain started after straining muscle last pm.  Has gotten worse.  Has not tried anything for pain.  No longer has uterus or ovaries.  Habits & Providers  Alcohol-Tobacco-Diet     Tobacco Status: never  Current Problems (verified): 1)  Abdominal Pain, Generalized  (ICD-789.07) 2)  Emesis  (ICD-787.03) 3)  Hearing Loss Nos or Deafness  (ICD-389.9) 4)  Dizziness, Chronic  (ICD-780.4) 5)  Breast Cancer  (ICD-174.9) 6)  Insomnia  (ICD-780.52) 7)  Menstruation, Painful  (ICD-625.3) 8)  Cervical Dysplasia  (ICD-622.1)  Current Medications (verified): 1)  Flonase 50 Mcg/act Susp (Fluticasone Propionate) .... 2 Sprays Each Nostril Daily For 10 Days or Until Symptoms Improved 2)  Tigan 300 Mg Caps (Trimethobenzamide Hcl) .Marland Kitchen.. 1 Tablet By Mouth Three Times A Day As Needed For Nausea and Vomiting Disp: Generic 3)  Diclofenac Sodium 75 Mg Tbec (Diclofenac Sodium) .Marland Kitchen.. 1 By Mouth Two  Times A Day With Food  Allergies (verified): 1)  Benadryl 2)  Phenergan  Past History:  Past Medical History: Last updated: 06/26/2008 congenital  deafness--requires interpreter,  Hx of vertigo R. breast ca,T1N1(mastectoomy, chemo, rad. 12/99), r. 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 Many people in family with hearing impairments. A few others with chronic dizziness.   Social History: Last updated: 01/25/2007 Pt is deaf.; Lives with fiancee and 2 children; Occ alcohol.; No smoking ever  Risk Factors: Smoking Status: never (04/25/2009)  Past Surgical History: Abd [place] - nl - 05/18/2005, bilat oophorectomy - 06/16/2004,  breast reconstruction - 12/12/1997,  btl - 12/12/1989,  Cholecystectomy - 06/16/2004,  COLPO -10/03 w/CIN I - 10/24/2001,  knee surgery -1990s - 08/15/2001,  ltcs - 12/13/1986,  modified radical mastectomy/lymph nodes - 12/12/1997 portacath - 12/12/1997 Hysterectomy Oophorectomy  Review of Systems  The patient denies anorexia, fever, decreased hearing, chest pain, syncope, dyspnea on exertion, peripheral edema, prolonged cough, headaches, hematochezia, and severe indigestion/heartburn.    Physical Exam  General:  alert, well-developed, and well-nourished.   Head:  normocephalic and atraumatic.   Neck:  supple.   Abdomen:  soft.  slight swelling and point tenderness over LLQ.     Impression & Recommendations:  Problem # 1:  ABDOMINAL PAIN, GENERALIZED (ICD-789.07) See work note for restricitions Trial of NSAIDS, rest and ice If signs'sdon't improve consider CT to r/o spigellian hernia or other hernia.  Complete Medication List: 1)  Flonase 50 Mcg/act Susp (Fluticasone propionate) .... 2 sprays each nostril daily for 10 days or until symptoms improved 2)   Tigan 300 Mg Caps (Trimethobenzamide hcl) .Marland Kitchen.. 1 tablet by mouth three times a day as needed for nausea and vomiting disp: generic 3)  Diclofenac Sodium 75 Mg Tbec (Diclofenac sodium) .Marland Kitchen.. 1 by mouth two times a day with food  Patient Instructions: 1)  Please schedule a follow-up appointment in 1 week, if needed.  Prescriptions: DICLOFENAC SODIUM 75 MG TBEC (DICLOFENAC SODIUM) 1 by mouth two times a day with food  #60 x 2   Entered and Authorized by:   Tinnie Gens MD   Signed by:   Tinnie Gens MD on 04/25/2009   Method used:   [facility]        [facility] [address] [phone]* (retail)       [address]       [place], [place]  [address]       Ph: [phone] or [phone]       Fax: [phone]   RxID:   [id number]   Appended Document: SEE TRIAGE NOTES/EO    Clinical Lists Changes  Orders: Added new Test order of [facility]- Est Level  3 ([address]) - Signed

## 2010-02-11 NOTE — Progress Notes (Signed)
Summary: needs futher note  Phone Note From Other Clinic Call back at [phone] x [phone]   Caller: Lang Snow- [employer] Summary of Call: wants to know what kind of restrictions she needs to have for her work - is just to recoop? fax [phone] Initial call taken by: De Nurse,  February 05, 2009 2:39 PM  Follow-up for Phone Call        Note is to excuse her from work x 48hours. Follow-up by: Marisue Ivan  MD,  February 05, 2009 4:24 PM

## 2010-02-11 NOTE — Assessment & Plan Note (Signed)
Summary: N/V, abd pain and cramps   Vital Signs:  Patient profile:   45 year old female Height:      66.5 inches Weight:      135 pounds BMI:     21.54 Temp:     97.7 degrees F oral Pulse rate:   65 / minute BP sitting:   114 / 73  Vitals Entered By: Gladstone Pih (February 05, 2009 10:15 AM) CC: C/O body aches, congestion cough Is Patient Diabetic? No Pain Assessment Patient in pain? no      Pt declined compazine injection after drawn up, wasted.Gladstone Pih  February 05, 2009 11:29 AM   Primary Care Provider:  Jamie Brookes MD  CC:  C/O body aches and congestion cough.  History of Present Illness: 45yo F c/o N/V.  N/V: Worsened N/V x 3 days.  Nonbloody emesis.  She was seen in the [facility] ED on 1/23.  Had a full w/u that was essentially neg.  She states that the zofran makes the symptoms worse.  She is allergic to phenergan.  She is not able to eat but still able to keep down some fluids.  No objective fevers but is having hot and cold sensations.  Has diffuse generalized abd pain and cramping.  No diarrhea.    Habits & Providers  Alcohol-Tobacco-Diet     Tobacco Status: never  Current Medications (verified): 1)  Flonase 50 Mcg/act Susp (Fluticasone Propionate) .... 2 Sprays Each Nostril Daily For 10 Days or Until Symptoms Improved  Allergies (verified): 1)  Benadryl 2)  Phenergan  Past History:  Past Medical History: Last updated: 06/26/2008 congenital  deafness--requires interpreter,  Hx of vertigo R. breast ca,T1N1(mastectoomy, chemo, rad. 12/99), r. 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  Has diffuse generalized abd pain and cramping.  No diarrhea.   Physical Exam  General:  VS Reviewed. Well appearing, NAD.    Head:  no sinus ttp Eyes:  no injected conjunctiva Mouth:  moist mucus membranes Neck:  supple, full ROM, no goiter or mass  Lungs:  Normal respiratory effort, chest expands symmetrically. Lungs are clear to auscultation, no crackles or wheezes. Heart:  Normal rate and regular rhythm. S1 and S2 normal without gallop, murmur, click, rub or other extra sounds. Abdomen:  soft, moderate ttp diffusely, no rebound, no guarding, hyperactive bs Skin:  warm, nl color and turgor   Impression & Recommendations:  Problem # 1:  ABDOMINAL PAIN, GENERALIZED (ICD-789.07) Assessment New Unclear of etiology at this time.  Previous w/u at [facility] 2 days ago was essentially neg.  Nl abd xray.  Lip 18.  WBC 3.5.  Electrolytes stable and Cr 0.98.  LFTs wnls.  Plan to recheck all of those labs today since symptoms are worsened.  Will have her f/u in 2 days to reassess.   Does not appear dehydrated on exam- P 65, BP 114/73.  Moist mucus membranes and nl skin turgor.  Orders: Comp Met-[facility] [id number]) CBC w/Diff-[facility] [id number]) Lipase-[facility] [id number]) [facility]- Est  Level 4 ([address])  Problem # 2:  EMESIS (ICD-787.03) Assessment: New Nonbloody.  Pt not responding well to the Zofran.  Allergic to phenergan.  Will give Tigan and reassess in 2 days.  She refused the compazine in the clinic.  Will check labs as stated above. Low suspicion for pregnancy. LMP - 13 yrs ago.   Does not appear dehydrated on exam- P 65, BP 114/73.  Moist mucus membranes and nl skin turgor.  Orders: Comp Met-[facility] [id number]) CBC w/Diff-[facility] ([address]) Lipase-[facility] [id number]) [facility]- Est  Level 4 ([address])  Problem # 3:  HEARING LOSS NOS OR DEAFNESS (ICD-389.9) Assessment: Comment Only Translator present.  Questions were  all thoroughly answered.    Complete Medication List: 1)  Flonase 50 Mcg/act Susp (Fluticasone propionate) .... 2 sprays each nostril daily for 10 days or until symptoms improved 2)  Tigan 300 Mg Caps (Trimethobenzamide hcl) .Marland Kitchen.. 1 tablet by mouth three times a day as needed for nausea and vomiting disp: generic  Patient Instructions: 1)  Schedule f/u appt in 2 days to reassess. 2)  I gave you a prescription to help with your nausea and vomiting. 3)  If you're not able to keep down fluids, please call us to be evaluated sooner. Prescriptions: TIGAN 300 MG CAPS (TRIMETHOBENZAMIDE HCL) 1 tablet by mouth three times a day as needed for nausea and vomiting disp: generic  #15 x 0   Entered and Authorized by:   Marisue Ivan  MD   Signed by:   Marisue Ivan  MD on 02/05/2009   Method used:   [facility]        [facility] [address] [phone]* (retail)       [address]       [place], [place]  [address]       Ph: [phone] or [phone]       Fax: [phone]   RxID:   [id number]

## 2010-02-11 NOTE — Assessment & Plan Note (Signed)
Summary: sinus infection?/[place]as/strother   Vital Signs:  Patient profile:   45 year old female Height:      66.5 inches Weight:      133 pounds BMI:     21.22 Temp:     98.2 degrees F oral Pulse rate:   82 / minute BP sitting:   122 / 72  (left arm) Cuff size:   regular  Vitals Entered By: Tessie Fass CMA (January 31, 2009 4:31 PM) CC: sinus infection. nausea and vomiting x 1 day Is Patient Diabetic? No Pain Assessment Patient in pain? yes     Location: body ache   Primary Care Provider:  Alanda Amass MD  CC:  sinus infection. nausea and vomiting x 1 day.  History of Present Illness: [name] comes in with URI symptoms for 1 day.  C/O runny nose, congestion, cough, sore throat.  Chest and back hurt when she coughs.  No fevers.  Has been nauseated.  Vomitted once today.  Slight headache.  No ear pain.   Habits & Providers  Alcohol-Tobacco-Diet     Tobacco Status: never  Allergies: 1)  Benadryl 2)  Phenergan  Physical Exam  General:  Well-developed,well-nourished,in no acute distress; alert,appropriate and cooperative throughout examination Head:  slight TTP over right maxillary sinus.  Eyes:  conjunctiva clear and moist Ears:  bilateral TMs normal Nose:  mucosa mildly edematous and pale Mouth:  OP with postnasal drip Lungs:  Normal respiratory effort, chest expands symmetrically. Lungs are clear to auscultation, no crackles or wheezes. Heart:  Normal rate and regular rhythm. S1 and S2 normal without gallop, murmur, click, rub or other extra sounds. Cervical Nodes:  No lymphadenopathy noted   Impression & Recommendations:  Problem # 1:  SINUSITIS (ICD-473.9) Assessment New  Likely viral.  Only 1 day in duration.  No indication for antibiotics.  WIll treat sore thraot and body aches with ibuprofen.  Nausea with zofran.  Nasal congestion with flonase.  Encourage fluids and rest.  Her updated medication list for this problem includes:    Flonase 50 Mcg/act  Susp (Fluticasone propionate) .Marland Kitchen... 2 sprays each nostril daily for 10 days or until symptoms improved  Orders: [facility]- Est  Level 4 ([address])  Complete Medication List: 1)  Trazodone Hcl 50 Mg Tabs (Trazodone hcl) .... Take one tablet daily as needed at bedtime for sleep. 2)  Omeprazole 20 Mg Tbec (Omeprazole) .... Take one tablet daily for reflux. 3)  Flonase 50 Mcg/act Susp (Fluticasone propionate) .... 2 sprays each nostril daily for 10 days or until symptoms improved 4)  Ibuprofen 800 Mg Tabs (Ibuprofen) .Marland Kitchen.. 1 tab by mouth three times a day with food as needed for pain 5)  Zofran 4 Mg Tabs (Ondansetron hcl) .Marland Kitchen.. 1 tab by mouth q 6 hrs as needed nausea  Patient Instructions: 1)  Use the ibuprofen for sore throat and back and body aches. 2)  Use the zofran for nausea. 3)  Use the flonase for your nasal congestion.  Use it at least 10 days or longer if you find it contniues to be helpful.  4)  Get plenty of rest and fluids.  Prescriptions: ZOFRAN 4 MG TABS (ONDANSETRON HCL) 1 tab by mouth q 6 hrs as needed nausea  #21 x 0   Entered and Authorized by:   Ardeen [place]  MD   Signed by:   Ardeen [place]  MD on 01/31/2009   Method used:   Print then Give to Patient   RxID:  [id number] IBUPROFEN 800 MG TABS (IBUPROFEN) 1 tab by mouth three times a day with food as needed for pain  #30 x 1   Entered and Authorized by:   Ardeen [place]  MD   Signed by:   Ardeen [place]  MD on 01/31/2009   Method used:   Print then Give to Patient   RxID:   [id number] FLONASE 50 MCG/ACT SUSP (FLUTICASONE PROPIONATE) 2 sprays each nostril daily for 10 days or until symptoms improved  #1 x 0   Entered and Authorized by:   Ardeen [place]  MD   Signed by:   Ardeen [place]  MD on 01/31/2009   Method used:   Print then Give to Patient   RxID:   [id number]

## 2010-02-13 NOTE — Letter (Signed)
Summary: Generic Letter  [facility]  [address]   [place], [place] [address]   Phone: [phone]  Fax: [phone]    01/21/2010 MRN: [id number] ph# [phone]  [address] [place], [place]  [address]   To whom it may concern,  Ms. RIGDON was seen at the [facility] becuase of a muscle sprain on her lower back that she sustained from heavy lifting. She will need to be out of work until [employer]. the 19th of January to allow for healthy healing.   Sincerely,   Edd Arbour M.D. [facility]

## 2010-02-13 NOTE — Assessment & Plan Note (Signed)
Summary: BACK PAIN/KH   Vital Signs:  Patient profile:   45 year old female Height:      66.5 inches Weight:      122.4 pounds BMI:     19.53 Temp:     97.7 degrees F oral Pulse rate:   64 / minute BP sitting:   124 / 81  (right arm) Cuff size:   regular  Vitals Entered By: Jimmy Footman, CMA (January 21, 2010 3:44 PM) CC: lower back pain x5 days Is Patient Diabetic? No Pain Assessment Patient in pain? yes     Location: back Intensity: 10+ Type: sharp   Primary Provider:  Jamie Brookes MD  CC:  lower back pain x5 days.  History of Present Illness: 1. Lumbar Back Strain patient was lifting heavy boxes without back support at her Job at [employer]. She was no able to move that evening and has tried over the counter remedies including icy hot, ibupofren, and tylenol. No neurological deficits appreciated from history.  On exam she has tenderness and swelling of her left paravertebral muscles in the lumbar region. She Has no spinal tenderness or deformities. she is able to walk and move around without difficulty.  She will get a 2 day course of 800mg  Ibuprofen tabs. She will take some time off work to avoid heavy lifting and allow her back muscle to heal.    Allergies: 1)  Benadryl 2)  Phenergan  Review of Systems       see hpi  Physical Exam  Additional Exam:  BACK: tender left lower paraspinal lumbar muscles. normal spine, no deformities   Impression & Recommendations:  Problem # 1:  BACK STRAIN, LUMBAR (ICD-847.2) Assessment New IBUPROFEN 800 mg tabs rest no heavy lifting  Complete Medication List: 1)  Flonase 50 Mcg/act Susp (Fluticasone propionate) .... 2 sprays each nostril daily for 10 days or until symptoms improved 2)  Tigan 300 Mg Caps (Trimethobenzamide hcl) .Marland Kitchen.. 1 tablet by mouth three times a day as needed for nausea and vomiting disp: generic 3)  Diclofenac Sodium 75 Mg Tbec (Diclofenac sodium) .Marland Kitchen.. 1 by mouth two times a day with food 4)   Ibuprofen 800 Mg Tabs (Ibuprofen) .... Take one tab every 6 hours for 2 days  Other Orders: [facility]- Est Level  3 ([address])  Patient Instructions: 1)  NO HEAVY LIFTING 2)  REST BACK 3)  Take IBUPROFEN WITH FOOD 4)  Please schedule a follow-up appointment as needed. 5)  Please schedule an appointment with your primary doctor in : Prescriptions: IBUPROFEN 800 MG TABS (IBUPROFEN) take one tab every 6 hours for 2 days  #8 x 0   Entered and Authorized by:   Edd Arbour   Signed by:   Edd Arbour on 01/21/2010   Method used:   [facility]        [facility] [address] [phone]* (retail)       [address]       [place], [place]  [address]       Ph: [phone] or [phone]       Fax: [phone]   RxID:   [id number]    Orders Added: 1)  [facility]- Est Level  3 [[address]]

## 2010-03-24 LAB — CBC
MCH: 29.8 pg (ref 26.0–34.0)
MCHC: 34.8 g/dL (ref 30.0–36.0)
MCV: 85.8 fL (ref 78.0–100.0)
Platelets: 211 10*3/uL (ref 150–400)
RBC: 4.29 MIL/uL (ref 3.87–5.11)

## 2010-03-24 LAB — URINALYSIS, ROUTINE W REFLEX MICROSCOPIC
Bilirubin Urine: NEGATIVE
Ketones, ur: NEGATIVE mg/dL
Leukocytes, UA: NEGATIVE
Nitrite: NEGATIVE
Urobilinogen, UA: 0.2 mg/dL (ref 0.0–1.0)

## 2010-03-24 LAB — HEPATIC FUNCTION PANEL
Alkaline Phosphatase: 74 U/L (ref 39–117)
Indirect Bilirubin: 2.1 mg/dL — ABNORMAL HIGH (ref 0.3–0.9)
Total Bilirubin: 2.4 mg/dL — ABNORMAL HIGH (ref 0.3–1.2)
Total Protein: 6.7 g/dL (ref 6.0–8.3)

## 2010-03-24 LAB — DIFFERENTIAL
Basophils Relative: 0 % (ref 0–1)
Eosinophils Absolute: 0 10*3/uL (ref 0.0–0.7)
Eosinophils Relative: 1 % (ref 0–5)
Lymphs Abs: 1.4 10*3/uL (ref 0.7–4.0)
Monocytes Relative: 3 % (ref 3–12)
Neutrophils Relative %: 78 % — ABNORMAL HIGH (ref 43–77)

## 2010-03-24 LAB — BASIC METABOLIC PANEL
CO2: 27 mEq/L (ref 19–32)
Calcium: 9.3 mg/dL (ref 8.4–10.5)
Chloride: 106 mEq/L (ref 96–112)
Creatinine, Ser: 0.78 mg/dL (ref 0.4–1.2)
GFR calc Af Amer: >60 mL/min (ref >60)
Glucose, Bld: 95 mg/dL (ref 70–99)

## 2010-03-24 LAB — PREGNANCY, URINE: Preg Test, Ur: NEGATIVE

## 2010-03-24 LAB — LIPASE, BLOOD: Lipase: 20 U/L (ref 11–59)

## 2010-03-30 LAB — URINE MICROSCOPIC-ADD ON

## 2010-03-30 LAB — CBC
MCHC: 34.1 g/dL (ref 30.0–36.0)
MCV: 87.4 fL (ref 78.0–100.0)
RBC: 4.03 MIL/uL (ref 3.87–5.11)
RDW: 12.6 % (ref 11.5–15.5)

## 2010-03-30 LAB — COMPREHENSIVE METABOLIC PANEL
Albumin: 3.8 g/dL (ref 3.5–5.2)
BUN: 5 mg/dL — ABNORMAL LOW (ref 6–23)
Chloride: 107 mEq/L (ref 96–112)
Creatinine, Ser: 0.98 mg/dL (ref 0.4–1.2)
GFR calc non Af Amer: >60 mL/min (ref >60)
Total Bilirubin: 0.9 mg/dL (ref 0.3–1.2)

## 2010-03-30 LAB — URINALYSIS, ROUTINE W REFLEX MICROSCOPIC
Leukocytes, UA: NEGATIVE
Nitrite: NEGATIVE
Specific Gravity, Urine: 1.014 (ref 1.005–1.030)
pH: 5.5 (ref 5.0–8.0)

## 2010-03-30 LAB — DIFFERENTIAL
Basophils Absolute: 0 10*3/uL (ref 0.0–0.1)
Lymphocytes Relative: 41 % (ref 12–46)
Monocytes Absolute: 0.4 10*3/uL (ref 0.1–1.0)
Neutro Abs: 1.6 10*3/uL — ABNORMAL LOW (ref 1.7–7.7)

## 2010-03-30 LAB — LIPASE, BLOOD: Lipase: 18 U/L (ref 11–59)

## 2010-03-30 LAB — CK: Total CK: 95 U/L (ref 7–177)

## 2010-04-22 LAB — URINALYSIS, ROUTINE W REFLEX MICROSCOPIC
Bilirubin Urine: NEGATIVE
Glucose, UA: NEGATIVE mg/dL
Specific Gravity, Urine: 1.02 (ref 1.005–1.030)

## 2010-04-22 LAB — COMPREHENSIVE METABOLIC PANEL
ALT: 51 U/L — ABNORMAL HIGH (ref 0–35)
AST: 43 U/L — ABNORMAL HIGH (ref 0–37)
Alkaline Phosphatase: 89 U/L (ref 39–117)
CO2: 29 mEq/L (ref 19–32)
Calcium: 9.4 mg/dL (ref 8.4–10.5)
GFR calc Af Amer: >60 mL/min (ref >60)
GFR calc non Af Amer: >60 mL/min (ref >60)
Glucose, Bld: 82 mg/dL (ref 70–99)
Potassium: 3.7 mEq/L (ref 3.5–5.1)
Sodium: 137 mEq/L (ref 135–145)

## 2010-04-22 LAB — CBC
Hemoglobin: 13.7 g/dL (ref 12.0–15.0)
MCHC: 34.4 g/dL (ref 30.0–36.0)
RBC: 4.52 MIL/uL (ref 3.87–5.11)
WBC: 7.4 10*3/uL (ref 4.0–10.5)

## 2010-04-22 LAB — URINE MICROSCOPIC-ADD ON

## 2010-04-22 LAB — DIFFERENTIAL
Basophils Relative: 1 % (ref 0–1)
Eosinophils Absolute: 0.1 10*3/uL (ref 0.0–0.7)
Eosinophils Relative: 2 % (ref 0–5)
Lymphs Abs: 1.9 10*3/uL (ref 0.7–4.0)
Monocytes Absolute: 0.4 10*3/uL (ref 0.1–1.0)

## 2010-04-22 LAB — POCT PREGNANCY, URINE: Preg Test, Ur: NEGATIVE

## 2010-04-29 LAB — POCT I-STAT, CHEM 8
Calcium, Ion: 1.19 mmol/L (ref 1.12–1.32)
HCT: 38 % (ref 36.0–46.0)
Hemoglobin: 12.9 g/dL (ref 12.0–15.0)
TCO2: 26 mmol/L (ref 0–100)

## 2010-04-29 LAB — DIFFERENTIAL
Eosinophils Absolute: 0.1 10*3/uL (ref 0.0–0.7)
Lymphocytes Relative: 31 % (ref 12–46)
Lymphs Abs: 2.1 10*3/uL (ref 0.7–4.0)
Neutro Abs: 4.1 10*3/uL (ref 1.7–7.7)
Neutrophils Relative %: 60 % (ref 43–77)

## 2010-04-29 LAB — HEPATIC FUNCTION PANEL
ALT: 24 U/L (ref 0–35)
AST: 18 U/L (ref 0–37)
Albumin: 3.7 g/dL (ref 3.5–5.2)
Alkaline Phosphatase: 77 U/L (ref 39–117)
Total Protein: 6.4 g/dL (ref 6.0–8.3)

## 2010-04-29 LAB — CBC
Platelets: 225 10*3/uL (ref 150–400)
WBC: 6.8 10*3/uL (ref 4.0–10.5)

## 2010-04-29 LAB — TYPE AND SCREEN

## 2010-04-29 LAB — ABO/RH: ABO/RH(D): O POS

## 2010-04-29 LAB — LIPASE, BLOOD: Lipase: 21 U/L (ref 11–59)

## 2010-05-30 NOTE — Discharge Summary (Signed)
NAMECAILIE, BOSSHART             ACCOUNT NO.:  [id number]   MEDICAL RECORD NO.:  [id number]          PATIENT TYPE:  INP   LOCATION:  5008                         FACILITY:  [facility]   PHYSICIAN:  C. Ulyess Mort, M.D.DATE OF BIRTH:  09/14/1965   DATE OF ADMISSION:  02/27/2005  DATE OF DISCHARGE:  03/02/2005                                 DISCHARGE SUMMARY   DISCHARGE DIAGNOSES:  1.  Viral gastroenteritis.  2.  Status post bilateral salpingo-oophorectomy.  3.  Status post cesarean section.  4.  Status post laparoscopic cholecystectomy (2004).  5.  Status post right mastectomy and transverse rectus abdominis      myocutaneous flap for right breast cancer with subsequent tamoxifen      therapy (1999).  6.  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  7.  History of ovarian cysts.  8.  History of genital herpes.  9.  Status post knee surgery (1990s).  10. 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   DISCHARGE MEDICATIONS:  1.  Calcium plus vitamin D 500 mg p.o. t.i.d.  2.  Percocet 5/325 mg one to two tablets p.o. q.6h. p.r.n.  3.  Compazine 5 mg p.o. q.6h. p.r.n.   CONDITION ON DISCHARGE:  Ms. Schloesser was initially admitted with  complaints of severe nausea and vomiting as well as severe myalgias.  It was  initially suspected that this may possibly be secondary to recurrence of the  breast cancer due to the dermatome distribution of her myalgias and  tenderness.  An MRI was subsequently performed to rule out metastasis.  This  was normal.  Her complaints of nausea and vomiting decreased during her  hospital stay.  At discharge her symptoms were markedly improved with  decreased pain, decreased nausea or vomiting; however, it is still not clear  as to the origin of these symptoms.  It is highly suspected that these are  secondary to a viral gastroenteritis with secondary myalgias; however, we  cannot rule out rheumatologic disease and therefore  a serum CRP and  erythrocyte sedimentation rate have been performed.  These results are  pending.  She will follow up with Chapman Fitch, M.D., at [facility]  outpatient clinic on March 06, 2005, at 4:30 p.m., to further evaluate  these complaints and to follow up from her hospital stay.  At time of  discharge she is doing well with improved symptoms.   PROCEDURES:  1.  February 28, 2005, MRI of the lumbar spine with and without contrast.      Impression:  Annular rent and shallow disk protrusion at L5 to S1.  No      other significant findings of the lumbar spine.  2.  MRI of the pelvis with and without contrast.  Unremarkable MR      examination of the pelvis, small amount of free pelvic fluid is noted.   BRIEF ADMITTING HISTORY AND PHYSICAL:   HISTORY OF PRESENT ILLNESS:  Ms. Yaun is a 45 year old white female  status post right mastectomy for right breast cancer with subsequent  chemotherapy, who presents with a two-week history of productive cough  and  approximately a two-day history of nausea, vomiting, headache, dizziness and  diffuse myalgias and hyperesthesia.  The patient initially presented to the  outpatient clinic on February 13, 2005, with a three-day history of  productive cough with nausea and vomiting, and she was treated with Levaquin  750 mg daily x7 days for pneumonia.  Without relief, she presented to [facility] and was told she had the flu, and was given Zofran and Tussionex.  Symptoms persisted and one to two days prior to admission she experienced  anorexia secondary to severe nausea and vomiting with dizziness and severe  myalgias and hyperesthesia.  Pain was described as feeling like knives over  the lateral upper extremities and right lower back to hip with areas that  felt like numbness and tingling.   ALLERGIES:  PHENERGAN and BENADRYL.   HOME MEDICATIONS:  Tylenol p.r.n., calcium plus vitamin D.   SUBSTANCE ABUSE HISTORY:  The patient denies  ever using tobacco, alcohol or  illicit drugs except for distant marijuana use.   SOCIAL HISTORY:  The patient is married, works at [employer].  Has Medicaid.  Lives with her husband and daughters, she has two daughters.   FAMILY MEDICAL HISTORY:  Family medical history is noteworthy only for her  mother having seizures, pancreatitis, gallstones and hypertension.   REVIEW OF SYSTEMS:  Positive as per HPI and below.  GENERAL:  Fever.  RESPIRATORY:  Cough, green and yellow sputum.  GASTROINTESTINAL:  Nausea,  vomiting, constipation and anorexia.  MUSCULOSKELETAL:  Muscle pain.   PHYSICAL EXAMINATION:  VITAL SIGNS:  Temperature 98.8, blood pressure  120/70, heart rate 85, respirations 20, oxygen saturation 97% on room air.  Weight 125.  GENERAL:  Appears in pain.  HEENT:  Pupils equal, round, and reactive to light, extraocular movements  intact.  Anicteric.  Mucous membranes pink and moist.  NECK:  Supple, no thyromegaly.  No lymphadenopathy.  PULMONARY:  Breath sounds clear to auscultation bilaterally.  CARDIOVASCULAR:  S1, S2, without murmurs, thrills, rubs or gallops.  GASTROINTESTINAL:  Soft, tender to light touch, active bowel sounds.  EXTREMITIES:  No clubbing, edema or cyanosis.  SKIN:  Intact.  NEUROLOGIC:  Alert and oriented x3.  Hyperesthesias.  Stuttering gait  secondary to pain and tenderness of the right lower back to hip and the  lateral upper arms bilaterally.  PSYCHIATRIC:  Appropriate affect.   HOSPITAL COURSE:  Problem 1.  MYALGIAS:  Ms. Swiger initially presented with severe pain  over the lateral upper extremities and her right lower back extending to her  right upper leg.  Because of her history of breast cancer and the possible  dermatomal distribution from her right lower back to her leg, it was  initially concerning that this might be due to spinal metastases.  This was  ruled out with an MR study (results reported earlier).  It is possible that her symptoms  may be due to an unusual rheumatologic presentation.  With her  recent history of Levaquin use, it is possible that she may have a  vasculitis secondary to Levaquin use; however, this would be very rare.  CRP  and sedimentation rates were performed.  The results are still pending.  They will be followed up by the patient's primary care physician.  Her pain  will be treated with p.r.n. Percocet as an outpatient.  Her symptoms are  most likely secondary to a viral gastroenteritis that has caused severe  myalgias.  This, however, is a diagnosis  of exclusion and if her symptoms  persist, she will require further evaluation.   Problem 2.  NAUSEA AND VOMITING:  Ms. Siegrist initially presented with  symptoms of severe nausea and vomiting and inability to tolerate a diet.  During her hospital stay she was treated with Compazine p.r.n., which has  allowed her to continue p.o. intake.  On date of discharge, she says her  symptoms have significantly improved and she was ready for discharge on  p.r.n. Compazine.  Symptoms are again likely secondary to viral  gastroenteritis and if her symptoms continue to persist, she will need a  further GI evaluation.   DISCHARGE LABORATORY DATA AND VITAL SIGNS:  Temperature 97.3, heart rate 60,  respirations 18, blood pressure 97/66, oxygen saturation 97% on room air.  White blood cells 4.0, hemoglobin 11.7, hematocrit 32.7, platelet count 233.  Sodium 138, potassium 4.1, chloride 103, CO2 30, glucose 99, BUN 4,  creatinine 0.9.  Total bilirubin 1.1, alkaline phosphatase 80, AST 23, ALT  27, total protein 5.5, albumin 3.2, calcium 9.3.  CK 45.      Sharin Mons, M.D.    ______________________________  C. Ulyess Mort, M.D.    WC/MEDQ  D:  03/02/2005  T:  03/02/2005  Job:  [id number]   cc:   Chapman Fitch, MD  Fax: [phone]

## 2010-05-30 NOTE — H&P (Signed)
[name], [name]                         ACCOUNT NO.:  [id number]   MEDICAL RECORD NO.:  [id number]                   PATIENT TYPE:  MAT   LOCATION:  MATC                                 FACILITY:  [facility]   PHYSICIAN:  Hal Morales, M.D.             DATE OF BIRTH:  03/19/1965   DATE OF ADMISSION:  11/03/2001  DATE OF DISCHARGE:                                HISTORY & PHYSICAL   CHIEF COMPLAINT:  Nausea, intractable vomiting and right lower quadrant pain  x2 days.   HISTORY OF PRESENT ILLNESS:  The patient is a 45 year old, white female with  a past medical history of congenital deafness, right breast cancer,  bilateral ovarian cysts and pelvic pain who developed severe nausea and  vomiting with right lower quadrant pain two days prior to admission.  The  patient reports that the pain and the nausea started simultaneously.  She  was unable to keep anything down for the past two days.  She reports some  streaks of blood in her most recent vomiting episodes which have mainly been  dry heaving.  She also complains of dizziness, which along with the nausea  and right lower quadrant pain, which is worse when she gets up or moves.  She describes the right lower quadrant pain as constant, sharp pinching pain  which has been localized the entire time in the right lower quadrant.  She  has had similar pain with her ovarian cysts.  She also reports that she has  had occasional bouts of nausea, vomiting and dizziness since she was  diagnosed and treated for her right breast cancer five years ago, but that  this nausea, vomiting and pain is more severe.  The patient did present to  the [facility] Emergency Room on the night prior to admission.  Lab work was  done at that time as were imaging studies and it was suggested that the  patient's pain was secondary to ovarian cyst.  The patient was sent to  [facility] today where she saw Dr. Pennie Rushing.  At [facility],  further  studies were performed and the patient received 4 mg of Zofran IV, 1  mg of IV Dilaudid and lactated Ringer's with IV fluid.  She was directly  admitted to [facility] at the request of Dr. Pennie Rushing for 23-hour  observation and treatment of her symptoms with possible further studies.   REVIEW OF SYMPTOMS:  Negative for fevers, chills, sweats, chest pain,  palpitations, shortness of breath, cough, diarrhea, constipation, upper  respiratory tract infection symptoms, dysuria and hematuria.  The patient  did report dizziness and weakness feeling fatigued and tired all the time  for the past several days.  She has had normal bowel movements until the  past several days.  She has had very limited p.o. intake.  The patient also  reports a diffuse headache which has just started and which  she blames on  her nausea and dizziness.  The patient denied any sick contacts and reports  that her last menstrual period was approximately two weeks ago.  She denies  any history of sexually transmitted infections.  She also denies any vaginal  bleeding or discharge.   PAST MEDICAL HISTORY:  1. Pelvic pain.  The patient received six months of Lupron Depot (started     three weeks after bilateral ovarian cystectomies).  2. Congenital deafness for which she requires a sign language interpreter.  3. Bilateral ovarian cysts previously removed via surgery.  4. Right breast cancer, status post mastectomy, chemotherapy, radiation     therapy and reconstruction (December 1999).  5. Genital herpes.   PAST OBSTETRICAL HISTORY:  G2, P2-0-0-2.   PAST SURGICAL HISTORY:  1. Radical mastectomy with lymph node removal in December 1999.  2. Breast reconstruction in December 1999.  3. Port-A-Cath placement in December 1999.  4. Low transverse C-section in December 1988.  5. Bilateral tubal ligation in December 1991.  6. Laparoscopic bilateral ovarian cystectomies November 09, 2000.  7. Knee surgery in the 1990s.   8. 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   MEDICATIONS:  1. Tamoxifen q.d.  2. Multivitamin q.d.   ALLERGIES:  1. BENADRYL which causes itching.  2. PHENERGAN causes increased nervousness.   SOCIAL HISTORY:  She lives with her fiance and her two children (38- and 49-  year-old boys).  She reports occasional alcohol, but no smoking or other  drug use.   FAMILY HISTORY:  The patient's mother and her maternal aunts had  pancreatitis and gallstones.  The patient's mother also suffers from  hypertension and seizures.  There is no known family history of past ovarian  or colon cancer.   PHYSICAL EXAMINATION:  VITAL SIGNS:  Temperature 97.5, blood pressure  116/68, pulse 56, respirations 21.  GENERAL:  The patient was initially sleeping and somewhat drowsy, but was  arousable, alert and oriented x3.  She was nontoxic appearing, but did look  to be in mild discomfort.  She was able to turn onto her sides and back  while lying on the bed and was also able to sit up without difficulty.  HEENT:  Pupils equal round and reactive to light.  Extraocular movements  intact.  Sclerae anicteric and noninjected.  Oropharynx was clear without  lesions or exudate and mucous membranes moist.  She has fairly good  dentition.  NECK:  Supple with no lymphadenopathy or thyromegaly.  LUNGS:  Clear to auscultation with decreased respiratory effort secondary to  nausea.  CARDIAC:  Regular rate and rhythm without murmur, rub or gallop.  Normal PMI  with 2+ symmetric radial and DP pulses bilaterally.  ABDOMEN:  Soft with normoactive bowel sounds.  Tenderness to palpation in  the right lower quadrant with some crepitus appreciated on palpation of the  right lower quadrant.  There was no rebound and no guarding.  No  hepatosplenomegaly or masses were appreciated.  EXTREMITIES:  No clubbing, cyanosis or edema.  Full range of motion x4.  SKIN:  No rashes. NEUROLOGIC:  Cranial nerves 2-12 grossly  intact.  No focal deficits were  noted, but a thorough neurological exam was not performed secondary to the  patient's nausea and dizziness.  PELVIC:  A pelvic exam had been performed earlier in the day by Dr. Pennie Rushing,  the results of which were reported as no cervical motion tenderness, a  bluish cervical lesion at 3 o'clock, no uterine  tenderness, no left adnexa  tenderness or masses, however, there was right adnexal fullness and  tenderness.   LABORATORY DATA AND X-RAY FINDINGS:  Labs that were performed earlier in the  day at the [facility] Emergency Department showed a negative urine pregnancy  test.  UA with specific gravity of 1.021 and pH of 6.5 with everything else  being within normal limits.  CBC showed a white count of 6.5 with a  differential of 58% neutrophils, 35% lymphs, hemoglobin 12.2, hematocrit  34.8 and platelets 200.  BMP showed a low potassium of 3.1, but was  otherwise normal with a sodium of 135, chloride 106, bicarb 26, BUN 27,  creatinine 0.9, glucose 93, calcium 8.8.  Liver studies showed total protein  of 6.6, albumin of 3.7, AST 15, ALT 13, Alk phos 45 and a slightly elevated  total bilirubin of 1.3.  A wet-prep was negative.  CT of the abdomen and  pelvis with rectal contrast showed no ductal dilatation, a normal pancreas,  spleen, liver, adrenals, kidneys and abdominal aorta.  Feces throughout the  abdomen.  Nonopacified small bowel due to the right pelvis which obscured  the appendix (oral contrast was suggested).  The was also free fluid in the  right adnexa and cul-de-sac which was questioned of acute ruptured ovarian  cyst.  The uterus is normal in size.  An acute abdominal series, showed no  evidence of bowel obstruction, no free air, no active lung disease.  A  transvaginal ultrasound showed small myometrial cysts, normal endometrium,  moderate amount of free fluid in the cul-de-sac and bilateral follicular  cysts of the ovaries with the right cyst  measuring 19 x 12 x 14 mm and the  left, 16 x 16 x 17 mm.   ASSESSMENT:  17. A 45 year old female with history of breast cancer and bilateral ovarian     cysts presents with a two-day history of nausea, intractable vomiting and     right lower quadrant pain.  The computed tomography previously obtained     was not optimal to rule out appendicitis secondary to the lack of     contrast because of the patient's inability to tolerate intake per mouth.     However, the patient is afebrile with a normal white count and no left     shift and the pain had always been localized to her right lower quadrant,     just somewhat not text book characteristic of appendicitis.  The patient     does not appear to have an acute abdomen at this time, but serial     abdominal exams should be followed.  The patient does have a history of     ovarian cysts which were again seen today on transvaginal ultrasound.    The presence of free fluid in the right adnexa and cul-de-sac suggest a     possible rupture of the right ovarian cyst which may explain the symptoms     somewhat.  We will recheck a white count, basic metabolic panel, liver     function tests and also get an amylase and lipase to check for     pancreatitis, although the pancreas was normal on computed tomography.     Morphine will be given as needed for pain.  This will be changed if the     pain control is not adequate.  Total bilirubin was slightly elevated     which may be secondary to vomiting, however, consider right  upper     quadrant ultrasound of the gallbladder if we cannot produce another     cause.  Repeating the computed tomography of the abdomen and pelvis with     oral contrast would possibly be beneficial, however, it appears that the     patient will not be able to tolerate intake by mouth at this time.  2. Nausea with intractable vomiting.  The patient will be made nothing per     mouth and started on intravenous fluids for hydration  and given Zofran 4     mg intravenous every four hours.  Labs will be followed as above to     ensure electrolyte balance.  3. Hypokalemia.  Potassium measured earlier this morning was 3.1.  The     patient is voiding currently, so potassium will be added to her     intravenous fluid and a basic metabolic panel will be repeated.     Supplement potassium as needed.  4. Congenital deafness.  The patient able to sign with interpreter for     communication.  It was recommended that we call as far as advance as     possible in order to have an interpreter available when we need one.  The     patient is also going to help interpret overnight and when an interpreter     is not immediate available, but we and the patient prefer an outside     interpreter.  5. History of right breast cancer.  The patient is currently on Tamoxifen     every day.  Because she is unable to tolerate intake by mouth, we will     hold her Tamoxifen at this time.     Georgina Peer, M.D.                 Hal Morales, M.D.   JM/MEDQ  D:  11/04/2001  T:  11/04/2001  Job:  [id number]   cc:   Bradly Bienenstock, M.D.  [facility] Resident  [place], [place] [address]  Fax: [phone]

## 2010-05-30 NOTE — Discharge Summary (Signed)
[name], DEMARTINI                         ACCOUNT NO.:  [id number]   MEDICAL RECORD NO.:  [id number]                   PATIENT TYPE:  INP   LOCATION:  5523                                 FACILITY:  [facility]   PHYSICIAN:  Asencion Partridge, MD                    DATE OF BIRTH:  11/10/1965   DATE OF ADMISSION:  11/03/2001  DATE OF DISCHARGE:  11/06/2001                                 DISCHARGE SUMMARY   ADMISSION DIAGNOSES:  1. Right lower quadrant pain with nausea and vomiting.  2. Ovarian cyst.   DISCHARGE DIAGNOSES:  1. Right lower quadrant pain with nausea and vomiting.  2. Ovarian cyst.   DISCHARGE MEDICATIONS:  1. Percocet 5/325 mg 1 to 2 p.o. q.4-6h. p.r.n. pain.  2. Zofran 4 mg 1 tablet q.4-6h. p.r.n. nausea.  3. Tamoxifen.  The patient was instructed to take this as before admission     to the hospital.   CONSULTATIONS:  None.   PROCEDURES:  CT of abdomen and pelvis with oral contrast on 11/04/2001.  Results showed small (3 to 4 cm) bilateral ovarian cysts with a small amount  of free fluid in the pelvis.  No evidence for appendicitis.   HISTORY AND PHYSICAL:  For complete History and Physical, please see  resident H&P in the chart.  Briefly this is a 45 year old white female with  history of congenital deafness who presented to her OB/GYN physician with  pelvic pain with nausea and vomiting on 11/03/2001.  His exam confirmed her  history of ovarian cysts, and she was given a dose of Zofran, Dilaudid, and  IV fluids and was sent to [facility] for further observation given  the severity of the right lower quadrant pain and nausea and vomiting.  She  was afebrile,and vital signs were normal on admission, and physical exam  revealed significant right lower quadrant tenderness to palpation without  rebound or guarding. Otherwise, exam was normal, and laboratory evaluation  revealed a potassium of 3.1.  White blood cell count 6.5.  Urinalysis was  within normal  limits with specific gravity of 1.021.  Urine pregnancy test  was negative.  All other laboratory work including CMET and CBC were within  normal limits.  Wet prep was negative.  She was admitted to the hospital for  further observation.   HOSPITAL COURSE:  1. RIGHT LOWER QUADRANT PAIN:  the patient's physical exam and level of pain     were out of proportion of the size of the ovarian cysts and the amount of     fluid in her pelvis.  It was, therefore, determined that she likely     needed CT scan of the abdomen to evaluate her appendix.  She was afebrile     and had a normal white blood cell count.  She was unable to drink the     oral contrast  material secondary to nausea and vomiting; so she was     observed further in the hospital.  However, she had worsening of her pain     the day after admission, and she was then able to drink the oral contrast     required for abdominal and pelvic CT scan at that time.  That CT scan is     dictated in the Procedure section and showed no evidence of appendicitis     and further supported ovarian cyst with small amount of free fluid in the     pelvis.   She was treated with IV pain medicine, and this was tapered down to oral  pain medicine the day before discharge, and her pain was well controlled on  this.  Her abdominal exam improved, and she was only mildly tender in the  right lower quadrant on discharge.  She was able to eat on the day prior to  discharge as well as on the day of discharge and was sent home on the  previously listed discharge medications and with phone numbers and  instructions for followup with Dr. Pennie Rushing and [place].   DISPOSITION:  The patient was discharged home in much improved condition,  and she was instructed to gradually advance her diet to a regular diet over  the next one to two days.  There were no restrictions placed on activities.     Jeoffrey Massed, M.D.                   Asencion Partridge, MD    PHM/MEDQ   D:  11/06/2001  T:  11/07/2001  Job:  [id number]

## 2010-05-30 NOTE — Op Note (Signed)
[name], [name]                         ACCOUNT NO.:  [id number]   MEDICAL RECORD NO.:  [id number]                   PATIENT TYPE:  INP   LOCATION:  9319                                 FACILITY:  [facility]   PHYSICIAN:  Lenoard Aden, M.D.             DATE OF BIRTH:  02/12/1965   DATE OF PROCEDURE:  05/16/2003  DATE OF DISCHARGE:                                 OPERATIVE REPORT   PREOPERATIVE DIAGNOSIS:  Pelvic pain, bilateral ovarian cysts.   POSTOPERATIVE DIAGNOSIS:  Pelvic pain, bilateral ovarian cysts.   PROCEDURE:  Diagnostic laparoscopy, pelvic washings, bilateral salpingo-  oophorectomy.   SURGEON:  Lenoard Aden, M.D.   ASSISTANT:  Chester Holstein. Earlene Plater, M.D.   ANESTHESIA:  General.   ESTIMATED BLOOD LOSS:  Less than 50 mL.   COMPLICATIONS:  None.   DRAINS:  None.   COUNTS:  Correct.   DISPOSITION:  Patient to the recovery room in good condition.   SPECIMENS:  Right and left ovary and tube to pathology.   OPERATIVE FINDINGS:  The gallbladder was absent.  The normal appendiceal bed  was noted.  No evidence of pelvic adhesions in the anterior and posterior  cul-de-sac were noted.  A normal size uterus was noted.  There were some  thin adhesions of the left sigmoid mesentery to the right pelvic sidewall  which were not a part of the operative field.   OPERATIVE PROCEDURE:  After being appraised of the risks of anesthesia,  infection, bleeding, injury to the abdominal organs, need for further  surgery for complications to include bowel and bladder injury in addition to  the inability to cure her pelvic pain, the patient was brought to the  operating room where she was administered general anesthetic without  complications, prepped and draped in the usual sterile fashion, catheterized  until the bladder was emptied.  A Hulka tenaculum was placed per vagina.  An  infraumbilical incision made with the scalpel.  The fascia was opened  transversely.  A  purse-string suture was placed, Hasson trocar placed.  Visualization reveals atraumatic trocar entry.  Pictures taken.  Normal size  uterus.  Bilateral cystic ovaries revealing probable follicular cysts,  pelvic washings obtained.  The left infundibulopelvic ligament after  placement of two 5 mm left and lower right quadrant trocar sites under  direct visualization, the left infundibulopelvic ligament is identified and  cauterized and divided after visualization of the ureter.  Progressive  cauterization and division down the mesosalpingeal pedicle on the left and  the adnexa is removed.  The same procedure was done on the right adnexa  after identification of the right ureter.  Endocatch is entered through the  operative umbilical port.  A 5 mm scope placed.  Both specimens were removed  separately in an endocatch through the umbilical port without difficulty.  Good hemostasis is noted.  Electrocautery is used to obtain added hemostasis  along  the right adnexal pedicle.  CO2 is released and good hemostasis  remains.  The incisions are closed using 0 Vicryl with closure of the purse-  string and a 4-0 Vicryl on the umbilical incision and Dermabond on all three  incisions was placed.  The instruments were removed from the vagina.  The  patient tolerated the procedure well and was transferred to the recovery  room in good condition.                                               Lenoard Aden, M.D.    RJT/MEDQ  D:  05/16/2003  T:  05/16/2003  Job:  [id number]

## 2010-05-30 NOTE — Discharge Summary (Signed)
[facility]  Patient:    [name] [facility] Visit Number: [id number] MRN: [id number]          Service Type: Attending:  Naima A. Normand Sloop, M.D. Dictated by:   Pierre Bali. Normand Sloop, M.D. Adm. Date:  12/01/00 Disc. Date: 12/03/00                             Discharge Summary  DISCHARGE DIAGNOSIS::         Ruptured ovarian cyst.  DISCHARGE MEDICATIONS:        Tylox and Lupron Depot.  DISPOSITION:                  The patient was discharged home to follow up in the office as scheduled.  She was given pain precautions through a side interpreter.  HISTORY OF PRESENT ILLNESS:   The patient is a 45 year old female who is status post diagnostic laparoscopy and laparoscopic bilateral cystectomy three weeks ago. She presented with complaints of right lower quadrant pain and some nausea and vomiting.  The patient had a full workup which was all found to be negative.  Urine pregnancy test was negative.  UA was negative.  Right upper quadrant ultrasound was negative.  KUB was significant for a small ileus, and chest x-ray was found to be within normal limits.  The patient always remained afebrile.  Her abdomen was tender to palpation but no rebound and no guarding. Her white count was low at 4.1.  Ultrasound did reveal an ovarian cyst with some free fluid suggesting ruptured ovarian cyst.  The patients pain improved with time.  She was given a morphine PCA for pain which she did not need later, also consistent with a ruptured ovarian cyst.  Surgery, Dr. Luan Pulling, was consulted and felt the pathology was also consistent with an ovarian cyst.  The patient was spoken with in detail through interpreters by myself and Dr. Pennie Rushing about Lupron Depot, and the patient agreed to this treatment.  The patient was given Lupron and discharged home to follow up in the office with pain precautions. Dictated by:   Pierre Bali. Normand Sloop, M.D. Attending:  Naima A. Dillard, M.D. DD:   12/03/00 TD:  12/03/00 Job: [id number]

## 2010-05-30 NOTE — Op Note (Signed)
. [facility]  Patient:    [name]                       MRN: [id number] Proc. Date: 02/06/99 Adm. Date:  [id number] Attending:  Stephenie Acres                           Operative Report  PREOPERATIVE DIAGNOSIS:  History of right breast cancer with right breast mass.  POSTOPERATIVE DIAGNOSIS:  History of right breast cancer with right breast mass.  PROCEDURE:  Excisional right breast biopsy.  SURGEON:  Catalina Lunger, M.D.  ANESTHESIA:  General.  DESCRIPTION OF PROCEDURE:  Patient was taken to the operating room, placed in the supine position.  After adequate anesthesia was induced using laryngeal mask, the right breast was prepped and draped in a normal sterile fashion.  Right at the border of the TRAM flap, there was a palpable mass.  An elliptical incision was  made around this, dissected down onto the pectoralis muscle, excising all tissue surrounding it.  Margins were marked with a marking suture and sent for pathologic evaluation.  Adequate hemostasis was ensured and the skin was closed with subcuticular 4-0 Monocryl.  Steri-Strips, sterile dressings were applied. Patient tolerated the procedure well, went to PACU in good condition. DD:  02/06/99 TD:  02/06/99 Job: [id number]

## 2010-05-30 NOTE — Op Note (Signed)
[name]. [facility]  Patient:    [name], [name]                      MRN: [id number] Proc. Date: 06/10/99 Adm. Date:  [id number] Attending:  Drema Pry CC:         [name], M.D.                           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  POSTOPERATIVE DIAGNOSES: 1. Chondromalacia patella, grade II, right knee with lateral patellar tilt and    track. 2. Posterolateral meniscus tear. 3. Tight lateral retinaculum.  OPERATIONS: 1. Right knee operative arthroscopy with posterior patellar abrasion    chondroplasty. 2. Medial and lateral plicae excisions. 3. Posteromedial meniscus meniscectomy flap tear. 4. Arthroscopic lateral retinacular release.  SURGEON:  [name], M.D.  ASSISTANT:  Currie Paris. Thedore Mins.  ANESTHESIA:  General with LMA.  CULTURES:  None.  DRAINS:  None.  ESTIMATED BLOOD LOSS:  Minimal.  TOURNIQUET TIME:  Without.  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  She has had problems with her knee for 15-years.  She first presented March 31, 1999, also with a lateral elbow problem.  She said that she felt that she injured her knee running track in the early 1980s.  The exam was initially consistent with chondromalacia patella of the right knee, and there was a medial plica with 1+ patellofemoral crepitation.  We injected the knee on the next visit, April 28, 1999, with a cortisone/Marcaine mixture.  She came back on May 21, 1999.  She said that the shot helped only temporarily. The knee began to hurt again, and so she was ready to have knee arthroscopy. It was very painful to her.  She was not making progress.  We did inject the elbow lateral epicondyle, which had resolved that problem.  She has, at surgery, a large, medial plica corresponding to her preoperative tender area, a lateral one  also, and an overgrowth of the medial meniscus.  She had a posterior horn, medial meniscus tear with a flap at the far posterior horn that we debrided to a stable rim leaving 90% of the meniscus.  Her ACL was intact.  Her lateral meniscus was intact.  The trochlea was intact.  She had a very tight, lateral retinaculum, which it was very difficult to get the scope over to that side, and a broken down area along the patellar ridge and medially, a grade III, dime-sized area.  We shaved out the plicae, shaved out the posterior horn medial meniscus tear, and smoothed to a stable rim.  We shaved the posterior patella, used the ablator there, and did an arthroscopic, lateral retinacular release.  She tracked much better after this.  DESCRIPTION OF PROCEDURE:  After adequate anesthesia had been obtained using LMA technique and 1 g of Ancef given IV prophylaxis, the patient was placed in the supine position.  The right lower extremity was prepped from the malleoli to the leg holder in the standard fashion.  After standard prepping and draping, a superolateral inflow portal was made.  The knee was insufflated with normal saline with the arthroscopic pump.  Medial and lateral scope portals were then made, and the joint was thoroughly inspected.  I then shaved out the medial plica  to the sidewall, lysed the medial band, and cauterized bleeding points.  I then shaved the anterior, medial meniscal overgrowth.  I then probed and shaved out the posterior horn, medial meniscus tear.  I reversed portals and checked the lateral compartment and the ACL.  I shaved out the lateral plica back to the sidewall and cauterized bleeding points.  I then shaved the posterior patella and smoothed the edges with an ablator. This was a mid-thickness defect.  I then did an arthroscopic lateral retinacular release from the vastus lateralis to the joint line, nicely improving the patellar tilt and track.  The knee was then  irrigated through the scope.  0.5% Marcaine was injected in and about the portals.  The portals were left open.  A bulky, sterile, compressive dressing was applied with lateral foam pad for tamponade and ______ .  The patient, having tolerated the procedure well, was awakened and taken to the recovery room in satisfactory condition.  She will be discharged with outpatient routine. Crutches, weightbearing as tolerated.  Given Tylox for pain and told to call the office for an appointment for recheck tomorrow. DD:  06/10/99 TD:  06/11/99 Job: [id number]

## 2010-05-30 NOTE — Op Note (Signed)
[facility]  Patient:    ROSELY, FERNANDEZ Visit Number: [id number] MRN: [id number]          Service Type: DSU Location: [facility] Attending Physician:  Shaune Spittle Dictated by:   Maris Berger. Pennie Rushing, M.D. Proc. Date: 11/09/00 Admit Date:  11/09/2000                             Operative Report  CONTINUATION  DESCRIPTION OF PROCEDURE:     (Continuation) Once the ovarian cyst had been removed from the right ovary and from the operative field, copious irrigation was carried out and hemostasis achieved in the ovary with bipolar cautery. A similar procedure was carried out on the opposite side with the left ovary being elevated into the operative field and the ovarian cyst being excised with a combination of sharp and hydrodissection. The cyst ruptured prior to removal with the egress of clear yellow fluid. This fluid was sent for pathologic evaluation as well. The remainder of the cyst wall was then dissected out of the ovary and removed from the operative field through the subumbilical port. Hemostasis was achieved in that ovary with bipolar cautery. Copious irrigation was carried out and approximately 100 cc of warm lactated Ringers left in the peritoneal cavity. All instruments were removed from the peritoneal cavity under direct visualization as the CO2 was allowed to escape. The subumbilical and suprapubic incisions were closed with subcuticular sutures of 3-0 Vicryl. Sterile dressings were applied. A Hulka tenaculum had been placed in the cervix prior to draping the abdomen and this was removed at the end of the surgical procedure. The patient was then awakened from general anesthesia and taken to the recovery room in satisfactory condition having tolerating the procedure well with sponge and instrument counts correct.  PATHOLOGY SPECIMENS:          Pelvic and peritoneal washings, left ovarian cyst fluid, left ovarian cyst wall, and  right ovarian cyst wall. Dictated by:   Maris Berger. Pennie Rushing, M.D. Attending Physician:  Shaune Spittle DD:  11/09/00 TD:  11/10/00 Job: [id number]

## 2010-05-30 NOTE — Op Note (Signed)
[facility]  Patient:    [name], [name] Visit Number: [id number] MRN: [id number]          Service Type: DSU Location: [facility] Attending Physician:  Shaune Spittle Dictated by:   Maris Berger. Pennie Rushing, M.D. Proc. Date: 11/09/00 Admit Date:  11/09/2000                             Operative Report  INCOMPLETE  PREOPERATIVE DIAGNOSES:       1. Persistent ovarian cysts.                               2. Pelvic pain.                               3. History of breast cancer.  POSTOPERATIVE DIAGNOSES:      1. Bilateral ovarian cysts.                               2. Pelvic pain.                               3. History of breast cancer.  SURGERIES                     1. Operative laparoscopy.                               2. Bilateral ovarian cystectomy.                               3. Pelvic peritoneal washings.  SURGEON:                      Vanessa P. Pennie Rushing, M.D.  FIRST ASSISTANT:              Henreitta Leber, P.A.  ANESTHESIA:                   General orotracheal.  ESTIMATED BLOOD LOSS:         Less than 50 cc.  COMPLICATIONS:                None.  FINDINGS:                     The uterus was normal size.  The tubes were status post interruption for tubal sterilization.  The right ovary contained a 4-cm unilocular cyst with no excrescences or adhesions.  The left ovary contained a 5-cm unilocular cyst with no excrescences and no adhesions.  The posterior cul-de-sac contained no adhesions or stigmata of endometriosis.  The anterior cul-de-sac contained no adhesions or stigmata of endometriosis.  DESCRIPTION OF PROCEDURE:     The patient was taken to the operating room after appropriate identification and placed on the operating table.  After obtaining adequate general anesthesia, she was placed in the modified lithotomy position.  The abdomen, perineum and vagina were prepped with multiple layers of Betadine and a Foley catheter  inserted into the bladder and connected to straight drainage.  The abdomen was draped as  a sterile field. Subumbilical and suprapubic injections of 0.25% Marcaine were undertaken for a total of 10 cc.  A subumbilical incision was made and a Veress cannula placed through that incision into the peritoneal cavity.  Pneumoperitoneum was created with 2.5 L of CO2.  The Veress cannula was removed and a laparoscopic trocar placed through the incision into the peritoneal cavity.  The laparoscope was placed through the trocar sleeve.  Suprapubic incisions were made to the right and left of midline and laparoscopic trocars placed through those incisions into the peritoneal cavity under direct visualization.  The above-noted findings were made and documented.  The right ovary was then grasped and, after obtaining pelvic peritoneal washings, the cyst was excised with a combination of sharp and hydrodissection.  The cyst did rupture prior to removal, but the cyst wall was removed through the trocar sleeve of the subumbilical incision.  A similar procedure was Dictated by:   Maris Berger. Pennie Rushing, M.D. Attending Physician:  Shaune Spittle DD:  11/09/00 TD:  11/10/00 Job: [id number]

## 2010-05-30 NOTE — Consult Note (Signed)
[name], [name]                         ACCOUNT NO.:  [id number]   MEDICAL RECORD NO.:  [id number]                   PATIENT TYPE:  INP   LOCATION:  9319                                 FACILITY:  [facility]   PHYSICIAN:  Lenoard Aden, M.D.             DATE OF BIRTH:  October 02, 1965   DATE OF CONSULTATION:  DATE OF DISCHARGE:                                   CONSULTATION   CHIEF COMPLAINT:  Pelvic pain.   HISTORY OF PRESENT ILLNESS:  The patient is a 45 year old white female,  status post cesarean section and tubal ligation, G1 who presents with a  subacute history of worsening lower abdominal pain.  She had ultrasound  performed on May 14, 2003, revealing normal size uterus and bilateral normal  ovaries with a 4 x 4 cm simple cyst in the right ovary and a 3 x 3 cm cyst  on the left ovary.  She has had this pain intermittently and appears to have  been long-term over the last three to five years.  Previously, she was last  worked up for this pain one year ago.  The patient resolved after  laparoscopic cholecystectomy and has now recurred over the past month.   PAST SURGICAL HISTORY:  1. Right mastectomy with TRAM flap for breast cancer five years ago.  2. Laparoscopic cholecystectomy 2004.  3. Cesarean section.  4. Bilateral tubal ligation.  5. Reportedly two laparoscopies done in 2003 and 2002 for ovarian cysts.   PAST MEDICAL HISTORY:  Congenital deafness.   MEDICATIONS:  Tamoxifen, Doxycycline, Ponstel, and oxycodone.   ALLERGIES:  PHENERGAN.   PHYSICAL EXAMINATION:  GENERAL APPEARANCE:  She is a thin white female,  somewhat uncomfortable.  VITAL SIGNS:  She has a blood pressure of 111/75, pulse 68, respiratory rate  18, temperature 98.7.  The patient is aided by her history by an  interpreter.  HEENT:  Normal.  LUNGS:  Clear.  CARDIOVASCULAR:  Regular rate and rhythm.  ABDOMEN:  Soft, scaphoid, no rebound or guarding.  There is some direct  tenderness to palpation in  the right lower quadrant.  EXTREMITIES:  __________.  NEUROLOGIC:  Nonfocal.  PELVIC:  Examination performed previously in the office reveals bilateral  adnexal tenderness and cervical motion tenderness.   White blood cell count 5.6, hemoglobin 12.3, platelet count 201, hematocrit  36.9.  Beta HCG negative on May 09, 2003.   IMPRESSION:  1. Recurrent lower abdominal pain.  2. History of breast cancer.  3. History of chronic pain.   Patient desires ovarian removal.   PLAN:  Admit to [facility] for expected management IV PCA.  Will keep  NPO after midnight and consider diagnostic laparoscopy, possible bilateral  oophorectomy if pain persists.  All this information is related to the  patient through an interpreter at this time.  Risks of surgery discussed.  Risks of anesthesia, infection, bleeding, injury to abdominal organs with  need for repair was discussed, delayed versus immediate complications to  include bowel and bladder injury noted.  The patient acknowledges and wishes  to consider with proceeding with surgery.                                               Lenoard Aden, M.D.    RJT/MEDQ  D:  05/15/2003  T:  05/16/2003  Job:  [id number]

## 2010-05-30 NOTE — H&P (Signed)
[facility]  Patient:    [name], [name] Visit Number: [id number] MRN: [id number]          Service Type: Attending:  Maris Berger. Pennie Rushing, M.D. Dictated by:   Henreitta Leber, P.A.                           History and Physical  DATE OF BIRTH:                April 04, 1965  HISTORY OF PRESENT ILLNESS:   [name] is a 45 year old, single white female para 2-0-0-2 who has congenital deafness and is status post right breast mastectomy secondary to breast cancer who presents with persistent and worsening right lower quadrant pelvic pain. The patients pain has been constant and unresponsive to prescription-strength NSAIDs and only minimally responsive to narcotics (they allow her to sleep) for the past three months. There are no alleviating or exacerbating factors related to her pain. The patients pain initially began November 2001. At which time, it was described as intense but intermittent. The patient has had numerous radiographic studies since that time to include, CT scans of the abdomen/pelvis and pelvic ultrasound. All studies reveal what appear to be benign bilateral ovary cysts with right ovary cyst ranging from 19 mm to 5.3 cm and left cyst ranging from 2.2 cm to 3.5 cm over the course of the past 10 months. The patients most recent pelvic ultrasound (November 08, 2000) was normal with the exception of bilateral simple ovarian cysts, right measuring 1.1 x 0.9 x 1.1 cm and left measuring 2.3 x 2.3 x 2.2 cm. The patient has had negative gonorrhea and Chlamydia cultures, and a CA 125 was also found to be within normal range (September 2002). The patient presents for diagnostic laparoscopy with ovary cystectomy as a means to evaluate and manage her pain.  OBSTETRIC HISTORY:            Gravida 2, para 2-0-0-2. The patient had one vaginal delivery and one cesarean section.  GYNECOLOGICAL HISTORY:        Menarche 45 years old. Periods are regular.  She does experience dysmenorrhea which typically is managed with the over-the-counter analgesia. She has no history of sexually transmitted diseases. She has had one episode of an ASCUS Pap smear in the year 2000. However, they have been normal since that time with her most recent being February 2002.  PAST MEDICAL HISTORY:         Congenital deafness, right breast cancer (November 1999), depression, anemia.  PAST SURGICAL HISTORY:        Status post right breast mastectomy with recontruction, November 1999; left chest lump excision - benign finding (January 2001); cesarean section x 1; laparoscopic tubal cautery; and knee surgery.  FAMILY HISTORY:               Positive for asthma, depression, hypertension, diabetes, and seizures.  SOCIAL HISTORY:               The patient is single. She is disabled and lives at home with her two children who are school age.  MEDICATIONS:                  1. Tamoxifen 20 mg one tablet daily.                               2.  Tylox one tablet daily.  ALLERGIES:                    PHENERGAN and BENADRYL. Both of which cause severe itching.  REVIEW OF SYSTEMS:            As per history of present illness. The patient does have a right arm rash which is pruritic. Otherwise negative.  PHYSICAL EXAMINATION:  VITAL SIGNS:                  Blood pressure 110/70, weight is 126, height is 5 feet 5 inches tall.  ENT:                          Within normal limits.  NECK:                         Thyroid is not enlarged.  HEART:                        Regular rate and rhythm. There is no murmur.  LUNGS:                        Without wheezes, rales, or rhonchi.  BACK:                         Without CVA tenderness.  ABDOMEN:                      Bowel sounds are present. It is soft. The patient does have tenderness with guarding in the right lower quadrant. There is no rebound or organomegaly.  EXTREMITIES:                  There is no clubbing,  cyanosis, or edema. However, the patient does have a 1 cm x 1 cm annular erythematous lesion which is marked by diffuse fine papules with central clearing (patient complains that this is pruritic). There is no adenopathy.  NEUROLOGICAL:                 Within normal limits as tested.  PELVIC:                       EGBUS is within normal limits. Vagina is rugous. Cervix is nontender without lesions. Uterus:  Normal size, shape, and consistency without tenderness. Adnexa:  Right adnexa is tender without any palpable mass. Left adnexa without tenderness or masses. Rectovaginal without masses or tenderness.  IMPRESSION:                   1. Pelvic pain.                               2. Persistent/recurrent ovarian cyst.                               3. Status post right breast mastectomy for                                  cancer.  DISPOSITION:  A discussion was held with the patient regarding the implications for her procedure along with the risks associated with this procedure to include but are not limited to reactions to anesthesia, damage to adjacent organs, bleeding, and infection. The patient was able to have her questions answered through an interpreter and written notes. She has consented to undergo a diagnostic laparoscopy with an ovarian cystectomy at [facility] on November 09, 2000, at 10:30 a.m. Dictated by:   Henreitta Leber, P.A. Attending:  Maris Berger. Pennie Rushing, M.D. DD:  11/08/00 TD:  11/08/00 Job: [id number]

## 2010-05-30 NOTE — Consult Note (Signed)
[place]  Patient:    [name], [name] Visit Number: [id number] MRN: [id number]          Service Type: OBS Location: [id number] Attending Physician:  Leonard Schwartz Dictated by:   Janine Limbo, M.D. Proc. Date: 12/01/00 Admit Date:  12/02/2000   CC:         Elliott L. Effie Shy, M.D.  Vanessa P. Pennie Rushing, M.D.   Consultation Report  HISTORY OF PRESENT ILLNESS:  [name] is a 45 year old female who presented to the emergency department tonight at [facility] complaining of abdominal pain.  The pain started on November 28, 2000 and it is now characterized as constant.  She rates the pain as a severity of 8 out of 10. The pain is predominately in the right lower quadrant but there is also some left lower quadrant discomfort.  The patient reports that she had a diagnostic laparoscopy three weeks ago by Dr. Dierdre Forth.  The patient has a history of chronic pelvic pain and the laparoscopy was performed because she was found to have ovarian cysts on ultrasound.  Operative findings included benign ovarian cysts.  Simple ovarian cystectomies were performed through the laparoscope.  The patient tolerated that procedure quite well.  The patient reports that the pain from her surgery did resolve.  Again, the onset of this current episode of pain began on November 28, 2000.  The patient reports that she has had one cesarean section in the past.  She has also had a bilateral tubal ligation.  Her last menstrual period was in October and she reports that she does have a history of irregular menses.  She reports that it is not unusual for her period to be up to 2-3 weeks late.  The patient reports that she has had fever and chills but she has not taken her temperature.  She has had some nausea but no vomiting.  She denies constipation and diarrhea.  She denies rectal bleeding.  She denies dysuria, frequency, and hematuria.   The patient reports that she has never had an appendectomy in the past.  Her last meal was on November 30, 2000 and she reports that she has had liquids today. She is somewhat hungry at the moment.  The patient last had intercourse on November 26, 2000.  She does not think that her pain has anything to do with intercourse.  She denies a vaginal discharge.  PAST MEDICAL HISTORY:  The patient was diagnosed with breast cancer in 1999 and she was treated with local excision and chemotherapy.  She does not remember the node status.  She did not receive radiation.  Her most recent mammogram was two months ago and it was within normal limits.  There is no evidence of recurrent disease.  DRUG ALLERGIES:  The patient is allergic to [facility] and also BENADRYL.  Both of these cause a rash.  CURRENT MEDICATIONS: 1. Tamoxifen. 2. Tylox.  SOCIAL HISTORY:  The patient drinks alcohol socially.  She denies cigarette use and recreational drug use.  REVIEW OF SYSTEMS:  Please see history of present illness.  FAMILY HISTORY:  Noncontributory.  PHYSICAL EXAMINATION:  VITAL SIGNS:  Temperature 97.4, blood pressure 86/53, respirations 18, pulse 68.  Pulse oximeter is 96% on room air.  HEENT:  Within normal limits except for the fact that the patient does not speak and she cannot hear.  She does comprehend well and she is able to use sign language and  communicate easily with an interpreter.  CHEST:  Clear.  HEART:  Regular rate and rhythm.  ABDOMEN:  Tender to deep palpation in the right lower quadrant.  It is also tender to palpation in the right upper quadrant.  There is no rebound tenderness present.  Bowel sounds are normal throughout.  No masses are appreciated.  The subumbilical incision is well healed.  She has two suprapubic incisions from her most recent laparoscopy and these are well healed.  There is a low transverse incision in her abdomen from her cesarean section in 1988.  There  is a midline transverse incision where she had breast cancer and a flap was taken.  EXTREMITIES:  Within normal limits.  NEUROLOGIC:  Grossly normal.  PELVIC:  External genitalia is normal.  The vagina is normal.  Cervix is nontender.  Uterus is normal size, shape, and consistency.  Adnexa:  There is tenderness in the right lower quadrant and no masses are appreciated.  LABORATORY VALUES:  Pregnancy test is negative.  Hemoglobin is 12.1, white blood cell count is 5700, platelet count is 207,000 (differential is within normal limits).  ASSESSMENT: 1. Right lower quadrant pain of uncertain etiology. 2. Status post diagnostic laparoscopy three weeks ago with bilateral    laparoscopic ovarian cystectomies. 3. Irregular menses with a negative pregnancy test.  PLAN:  The patient will be observed in the emergency department while we await further laboratory testing.  She has an amylase and a comprehensive metabolic panel that is currently pending.  A wet prep is also pending of the vagina. GC and chlamydia cultures were obtained.  An ultrasound will be obtained.  If the patient does not feel better in the near future, we will plan to admit the patient to the [facility] for 23-hour observation. Further management will depend upon the patients improvement over time and also upon the patients laboratory values. Dictated by:   Janine Limbo, M.D. Attending Physician:  Leonard Schwartz DD:  12/01/00 TD:  12/02/00 Job: [id number]

## 2010-05-30 NOTE — Op Note (Signed)
   [name], Debra                         ACCOUNT NO.:  [id number]   MEDICAL RECORD NO.:  [id number]                   PATIENT TYPE:  OBV   LOCATION:  0373                                 FACILITY:  [facility]   PHYSICIAN:  Vikki Ports, M.D.         DATE OF BIRTH:  05-Feb-1965   DATE OF PROCEDURE:  09/08/2002  DATE OF DISCHARGE:                                 OPERATIVE REPORT   PREOPERATIVE DIAGNOSIS:  Acute cholecystitis.   POSTOPERATIVE DIAGNOSIS:  Acute cholecystitis, normal cholangiogram.   PROCEDURE:  Laparoscopic cholecystectomy with intraoperative cholangiogram.   SURGEON:  Vikki Ports, M.D.   ASSISTANT:  Rose Phi. Maple Hudson, M.D.   ANESTHESIA:  General.   DESCRIPTION OF PROCEDURE:  The patient was taken to the operating room,  placed in supine position. After adequate general anesthesia was induced  using an endotracheal tube, the abdomen was prepped and draped in normal  sterile fashion. Using a left paramedian infraumbilical transverse incision,  I dissected down to the fascia. Using the Optiview 11 mm trocar, I entered  the abdomen and established pneumoperitoneum. Under direct visualization, a  10 mm port was placed in the subxiphoid region, two 5 mm ports were placed  in the right abdomen. The gallbladder had a number of adhesions to it  including adhesions to the duodenum which were taken down with sharp  dissection. The neck of the gallbladder was retracted laterally until the  cystic duct could easily be identified. A good window was created behind it  as was the cystic artery. The cystic artery was triply clipped and divided.  The cystic duct was then clamped at the gallbladder and small ductotomy was  made. The cholangiocatheter was introduced in the right upper quadrant and  cholangiogram was performed. There appeared to be a small air bubble that  cleared easily with flushing. There were no filling defects and no anatomic  abnormalities.  Cholangiocatheter was removed, proximal cystic duct was then  triply clipped and divided, gallbladder was taken off the gallbladder bed  using Bovie electrocautery, placed in the EndoCatch bag and removed through  the 11 mm port. The right upper quadrant was copiously irrigated, adequate  hemostasis was ensured, trocars were removed after pneumoperitoneum had been  released. The skin incisions were closed with subcuticular 4-0 Monocryl,  Steri-Strips and sterile dressings were applied. The patient tolerated the  procedure well and went to PACU in good condition.                                               Vikki Ports, M.D.    KRH/MEDQ  D:  09/08/2002  T:  09/09/2002  Job:  [id number]

## 2010-05-30 NOTE — Consult Note (Signed)
NAMEFREDDI, FORSTER                         ACCOUNT NO.:  [id number]   MEDICAL RECORD NO.:  [id number]                   PATIENT TYPE:  EMS   LOCATION:  ED                                   FACILITY:  [facility]   PHYSICIAN:  Lorne Skeens. Hoxworth, M.D.          DATE OF BIRTH:  1965-12-08   DATE OF CONSULTATION:  05/15/2003  DATE OF DISCHARGE:                                   CONSULTATION   REASON FOR CONSULTATION:  I was asked by [name], N.P. and Dr.  Billy Coast of [facility] to evaluate [name].  She is a  45 year old white female with a history of recurrent right lower quadrant  abdominal pain as described below.  She now presents with approximately four  weeks of progressive right lower quadrant pain.  She describes sharp  constant pain in her right lower quadrant.  Denies any radiation.  The pain  has gradually become more severe.  She has had occasional nausea and  vomiting, but not any persistent vomiting.  She did vomit apparently bilious  material this morning.  She states occasionally there has been some streaks  of blood in the vomitus.  She has not had any change in bowel habits which  are normal.  Denies any urinary burning, frequency, or blood, or any vaginal  bleeding or discharge.  She has undergone a fairly extensive workup over the  last couple of weeks with CT scan of the abdomen, pelvic ultrasound, and  laboratory work as described below.  She states that the pain is essentially  intolerable at this point.  She was evaluated at [facility]  earlier this morning then sent to [facility] who called me and she is  seen now again in the emergency room.  She has a history of hospitalization  in October 2003, with right lower quadrant pain, nausea, and vomiting.  Hospitalized by Dr. Pennie Rushing.  There were findings of ovarian cyst on  ultrasound at that time.  This eventually resolved non-operatively and she  was discharged.   History of laparoscopy by Dr. Pennie Rushing in October 2002, with  findings of bilateral ovarian cysts.   PAST SURGICAL HISTORY:  1. Right mastectomy and TRAM flap for carcinoma of the right breast five     years ago with subsequent chemotherapy and no evidence of recurrence on     regular followup by Dr. Darrold Span.  2. Laparoscopic cholecystectomy by Dr. Luan Pulling in 2004.  3. Cesarean section.  4. Bilateral tubal ligation.  5. History of knee surgery.   PAST MEDICAL HISTORY:  She is congenitally deaf.   CURRENT MEDICATIONS:  1. Tamoxifen 20 mg daily.  2. _____________.  3. Doxycycline daily.  Started with this illness.  4. Oxycodone p.r.n. for pain with this illness.   ALLERGIES:  PHENERGAN which causes agitation.   SOCIAL HISTORY:  She is married.  Rare alcohol, no cigarettes.  FAMILY HISTORY:  Noncontributory.   REVIEW OF SYSTEMS:  GENERAL:  No fever, chills.  RESPIRATORY:  No shortness  of breath, cough, wheezing.  CARDIAC:  No chest pain, palpitations.  GASTROINTESTINAL:  As above.  GENITOURINARY:  As above.  EXTREMITIES:  No  joint swelling or pain.   PHYSICAL EXAMINATION:  VITAL SIGNS:  Temperature 98.7, pulse 68,  respirations 18, blood pressure 111/75.  GENERAL:  She is a thin white female, uncomfortable, but does not appear in  severe distress.  SKIN:  Warm and dry, no rash or infection.  HEENT:  No mass or thyromegaly.  Sclerae nonicteric.  LUNGS:  Clear to auscultation without increased work of breathing.  CARDIAC:  Regular rate and rhythm without murmurs.  ABDOMEN:  Multiple well-healed incisions secondary to laparoscopy and TRAM  flap.  Nondistended.  Bowel sounds are active.  She has mild diffuse  tenderness.  She is very tender in the right lower quadrant even to very  light touch.  She really will not allow a deep exam.  No history of any  hernias on exam.  No discernable masses.  EXTREMITIES:  Without swelling.  NEUROLOGIC:  She is deaf.  She is alert and  oriented.   LABORATORY DATA:  White count is 5.6, hemoglobin 12.3, platelets 201.  Electrolytes, LFT's, lipase, urinalysis are all unremarkable.  Quantitative  hCG is less than 2.   Imaging:  CT scan of the abdomen and pelvis on April 27, 2003, was entirely  negative.  The appendix was not definitely seen, but there was no evidence  of any inflammatory process in the right lower quadrant.  Flat and upright  abdominal x-ray today is normal with an unremarkable bowel gas pattern.  Surgical clips are noted.  Ultrasound of the pelvis, trans-abdominal and  trans-vaginal, reveals some free fluid in the pelvis and a cystic structure  in the right adnexa which was felt to represent either dilated tube or an  ovarian cyst with ovarian parenchyma not well seen.  This is about 3 cm in  diameter.  There is a simple cyst apparent in the left ovary.   ASSESSMENT AND PLAN:  A 45 year old white female with persistent right lower  quadrant pain and also with a history of same in the past, also history of  multiple ovarian cysts.  On my history, examination, and review of the x-  rays, I strongly doubt this is a primary gastrointestinal problem.  I see no  evidence of appendicitis.  She certainly could have adhesions from previous  surgery, but no evidence of bowel obstruction.  I suspect her nausea and  vomiting is secondary to a combination of pain, anxiety, and narcotic  medication.  I have discussed this with  ____________[facility], and really the only abnormal finding we have  is related to the right adnexa.  I suspect she may benefit from diagnostic  laparoscopy from a GYN perspective, and she is going to be re-evaluated for  this.  I will plan to follow her on a daily basis, and of course will be  available if there are any new findings.                                               Lorne Skeens. Hoxworth, M.D.    Tory Emerald  D:  05/15/2003  T:  05/15/2003  Job:  [id number]  cc:   Lenoard Aden, M.D.  [address]  [place]  [place] [address]  Fax: [phone]   Lennis P. Darrold Span, M.D.  [address] [facility]  [place]  [place] [address]  Fax: [phone]

## 2010-09-04 ENCOUNTER — Encounter: Payer: Self-pay | Admitting: Family Medicine

## 2010-09-04 ENCOUNTER — Ambulatory Visit (INDEPENDENT_AMBULATORY_CARE_PROVIDER_SITE_OTHER): Payer: Medicaid Other | Admitting: Family Medicine

## 2010-09-04 VITALS — BP 109/69 | HR 71 | Temp 97.6°F | Wt 120.7 lb

## 2010-09-04 DIAGNOSIS — J029 Acute pharyngitis, unspecified: Secondary | ICD-10-CM

## 2010-09-04 NOTE — Patient Instructions (Signed)
Your sore throat is most likely caused by a virus. This should improve over next several days. You may try cough drops, lozenges, warm salt water gargling. If your symptoms do not improved in next 5-7 days, please call the office. You may also try motrin or ibuprofen for discomfort.  Pharyngitis (Viral and Bacterial) Pharyngitis is soreness (inflammation) or infection of the pharynx. It is also called a sore throat. CAUSES Most sore throats are caused by viruses and are part of a cold. However, some sore throats are caused by strep and other bacteria. Sore throats can also be caused by post nasal drip from draining sinuses, allergies and sometimes from sleeping with an open mouth. Infectious sore throats can be spread from person to person by coughing, sneezing and sharing cups or eating utensils. TREATMENT Sore throats that are viral usually last 3-4 days. Viral illness will get better without medications (antibiotics). Strep throat and other bacterial infections will usually begin to get better about 24-48 hours after you begin to take antibiotics. HOME CARE INSTRUCTIONS  If the caregiver feels there is a bacterial infection or if there is a positive strep test, they will prescribe an antibiotic. The full course of antibiotics must be taken!! If the full course of antibiotic is not taken, you or your child may become ill again. If you or your child has strep throat and do not finish all of the medication, serious heart or kidney diseases may develop.   Drink enough water and fluids to keep your urine clear or pale yellow.   Only take over-the-counter or prescription medicines for pain, discomfort or fever as directed by your caregiver.   Get lots of rest.   Gargle with salt water ( tsp. of salt in a glass of water) as often as every 1-2 hours as you need for comfort.   Hard candies may soothe the throat if individual is not at risk for choking. Throat sprays or lozenges may also be used.    SEEK MEDICAL CARE IF:  Large, tender lumps in the neck develop.   A rash develops.   Green, yellow-brown or bloody sputum is coughed up.   You or your child has an oral temperature above 102 F (38.9 C).   Your baby is older than 3 months with a rectal temperature of 100.5 F (38.1 C) or higher for more than 1 day.  SEEK IMMEDIATE MEDICAL CARE IF:  A stiff neck develops.   You or your child are drooling or unable to swallow liquids.   You or your child are vomiting, unable to keep medications or liquids down.   You or your child has severe pain, unrelieved with recommended medications.   You or your child are having difficulty breathing (not due to stuffy nose).   You or your child are unable to fully open your mouth.   You or your child develop redness, swelling, or severe pain anywhere on the neck.   You or your child has an oral temperature above 102 F (38.9 C), not controlled by medicine.   Your baby is older than 3 months with a rectal temperature of 102 F (38.9 C) or higher.   Your baby is 31 months old or younger with a rectal temperature of 100.4 F (38 C) or higher.  MAKE SURE YOU:   Understand these instructions.   Will watch your condition.   Will get help right away if you are not doing well or get worse.  Document Released: 12/29/2004  Document Re-Released: 06/18/2009 [facility] Patient Information 2011 [place], [place].

## 2010-09-04 NOTE — Progress Notes (Signed)
  Subjective:    Patient ID: [name], female    DOB: [birth year], 45 y.o.   MRN: [id number]  HPI 1. Sore throat. Began 2 days ago. Feels painful and swollen. Has an intermittent nonproductive cough, chills, mild headache. No known sick contacts. Denies sinus pain, purulent drainage, dyspnea, wheezing, chest pain. Has not tried any medications for symptoms.   Review of Systems No N/V/D, abdominal pain, visual changes, tooth pain. Endorses mild odynophagia.    Objective:   Physical Exam  Vitals reviewed. Constitutional: She is oriented to person, place, and time. She appears well-developed and well-nourished. No distress.  HENT:  Head: Normocephalic and atraumatic.  Right Ear: External ear normal.  Left Ear: External ear normal.  Nose: Nose normal.  Mouth/Throat: No oropharyngeal exudate.       Mild erythema in posterior pharynx. No exudates.  Eyes: EOM are normal. Pupils are equal, round, and reactive to light. Right eye exhibits no discharge. Left eye exhibits no discharge.  Neck: Normal range of motion. Neck supple.       Bilateral anterior cervical/submandibular tender LAD.   Cardiovascular: Normal rate, regular rhythm and normal heart sounds.   No murmur heard. Pulmonary/Chest: Effort normal and breath sounds normal. No respiratory distress. She has no wheezes. She has no rales. She exhibits no tenderness.  Lymphadenopathy:    She has cervical adenopathy.  Neurological: She is alert and oriented to person, place, and time. No cranial nerve deficit.  Psychiatric: She has a normal mood and affect.          Assessment & Plan:

## 2010-09-04 NOTE — Assessment & Plan Note (Signed)
Negative rapid strep. Most likely viral syndrome with cough and HA. Reassured regarding conservative management with cough drops, salt water gargling, motrin prn. Discuss red flags: fever, facial pain, worsened symptoms in next 4-5 days. Will drink plenty of fluids, rest, hand hygiene and follow up prn.

## 2010-09-24 ENCOUNTER — Emergency Department (HOSPITAL_COMMUNITY)
Admission: EM | Admit: 2010-09-24 | Discharge: 2010-09-25 | Disposition: A | Discharge status: Home / Self Care | Payer: Medicaid Other | Attending: Emergency Medicine | Admitting: Emergency Medicine

## 2010-09-24 DIAGNOSIS — R112 Nausea with vomiting, unspecified: Secondary | ICD-10-CM | POA: Insufficient documentation

## 2010-09-24 DIAGNOSIS — N390 Urinary tract infection, site not specified: Secondary | ICD-10-CM | POA: Insufficient documentation

## 2010-09-24 DIAGNOSIS — R109 Unspecified abdominal pain: Secondary | ICD-10-CM | POA: Insufficient documentation

## 2010-09-24 DIAGNOSIS — R42 Dizziness and giddiness: Secondary | ICD-10-CM | POA: Insufficient documentation

## 2010-09-25 LAB — URINE MICROSCOPIC-ADD ON

## 2010-09-25 LAB — COMPREHENSIVE METABOLIC PANEL
ALT: 16 U/L (ref 0–35)
AST: 19 U/L (ref 0–37)
Alkaline Phosphatase: 81 U/L (ref 39–117)
CO2: 24 mEq/L (ref 19–32)
Chloride: 108 mEq/L (ref 96–112)
Creatinine, Ser: 0.67 mg/dL (ref 0.50–1.10)
GFR calc non Af Amer: >60 mL/min (ref >60)
Potassium: 3.4 mEq/L — ABNORMAL LOW (ref 3.5–5.1)
Sodium: 142 mEq/L (ref 135–145)
Total Bilirubin: 1.3 mg/dL — ABNORMAL HIGH (ref 0.3–1.2)

## 2010-09-25 LAB — CBC
Hemoglobin: 11.8 g/dL — ABNORMAL LOW (ref 12.0–15.0)
MCH: 30.1 pg (ref 26.0–34.0)
MCHC: 35.5 g/dL (ref 30.0–36.0)

## 2010-09-25 LAB — URINALYSIS, ROUTINE W REFLEX MICROSCOPIC
Bilirubin Urine: NEGATIVE
Glucose, UA: NEGATIVE mg/dL
Ketones, ur: NEGATIVE mg/dL
Protein, ur: NEGATIVE mg/dL

## 2010-09-25 LAB — DIFFERENTIAL
Basophils Relative: 0 % (ref 0–1)
Eosinophils Relative: 1 % (ref 0–5)
Lymphocytes Relative: 10 % — ABNORMAL LOW (ref 12–46)
Lymphs Abs: 1.3 10*3/uL (ref 0.7–4.0)
Monocytes Relative: 4 % (ref 3–12)
Neutro Abs: 11.2 10*3/uL — ABNORMAL HIGH (ref 1.7–7.7)

## 2010-09-28 ENCOUNTER — Emergency Department (HOSPITAL_COMMUNITY)
Admission: EM | Admit: 2010-09-28 | Discharge: 2010-09-29 | Disposition: A | Discharge status: Home / Self Care | Payer: Medicaid Other | Attending: Emergency Medicine | Admitting: Emergency Medicine

## 2010-09-28 ENCOUNTER — Emergency Department (HOSPITAL_COMMUNITY): Payer: Medicaid Other

## 2010-09-28 DIAGNOSIS — R1031 Right lower quadrant pain: Secondary | ICD-10-CM | POA: Insufficient documentation

## 2010-09-28 DIAGNOSIS — S01119A Laceration without foreign body of unspecified eyelid and periocular area, initial encounter: Secondary | ICD-10-CM | POA: Insufficient documentation

## 2010-09-28 LAB — CBC
HCT: 37.1 % (ref 36.0–46.0)
Hemoglobin: 13.2 g/dL (ref 12.0–15.0)
MCHC: 35.6 g/dL (ref 30.0–36.0)
RBC: 4.37 MIL/uL (ref 3.87–5.11)

## 2010-09-28 LAB — POCT I-STAT, CHEM 8
BUN: 6 mg/dL (ref 6–23)
Creatinine, Ser: 0.9 mg/dL (ref 0.50–1.10)
Hemoglobin: 12.6 g/dL (ref 12.0–15.0)
Potassium: 3.4 mEq/L — ABNORMAL LOW (ref 3.5–5.1)
Sodium: 141 mEq/L (ref 135–145)

## 2010-09-28 LAB — URINE MICROSCOPIC-ADD ON

## 2010-09-28 LAB — URINALYSIS, ROUTINE W REFLEX MICROSCOPIC
Bilirubin Urine: NEGATIVE
Glucose, UA: NEGATIVE mg/dL
Ketones, ur: NEGATIVE mg/dL
Specific Gravity, Urine: 1.011 (ref 1.005–1.030)
pH: 6.5 (ref 5.0–8.0)

## 2010-09-28 LAB — DIFFERENTIAL
Basophils Absolute: 0 10*3/uL (ref 0.0–0.1)
Lymphocytes Relative: 32 % (ref 12–46)
Monocytes Absolute: 0.4 10*3/uL (ref 0.1–1.0)
Monocytes Relative: 6 % (ref 3–12)
Neutro Abs: 4.1 10*3/uL (ref 1.7–7.7)
Neutrophils Relative %: 60 % (ref 43–77)

## 2010-09-28 MED ORDER — IOHEXOL 300 MG/ML  SOLN
80.0000 mL | Freq: Once | INTRAMUSCULAR | Status: AC | PRN
  Administered 2010-09-28: 80 mL via INTRAVENOUS

## 2010-09-29 LAB — WET PREP, GENITAL
Trich, Wet Prep: NONE SEEN
Yeast Wet Prep HPF POC: NONE SEEN

## 2010-09-30 LAB — GC/CHLAMYDIA PROBE AMP, GENITAL
Chlamydia, DNA Probe: NEGATIVE
GC Probe Amp, Genital: NEGATIVE

## 2010-10-06 LAB — URINALYSIS, ROUTINE W REFLEX MICROSCOPIC
Bilirubin Urine: NEGATIVE
Ketones, ur: NEGATIVE
Nitrite: NEGATIVE
Protein, ur: NEGATIVE
Specific Gravity, Urine: 1.038 — ABNORMAL HIGH
Urobilinogen, UA: 0.2

## 2010-10-06 LAB — CBC
HCT: 40.3
MCHC: 34.2
MCV: 85.9
Platelets: 246
RDW: 12.1
WBC: 8.5

## 2010-10-06 LAB — URINE MICROSCOPIC-ADD ON

## 2010-10-06 LAB — BASIC METABOLIC PANEL
BUN: 14
CO2: 26
Chloride: 104
Creatinine, Ser: 0.94
Glucose, Bld: 107 — ABNORMAL HIGH
Potassium: 3.7

## 2010-10-06 LAB — DIFFERENTIAL
Basophils Relative: 0
Eosinophils Absolute: 0.1
Eosinophils Relative: 1
Lymphs Abs: 0.5 — ABNORMAL LOW
Neutrophils Relative %: 89 — ABNORMAL HIGH

## 2010-10-10 ENCOUNTER — Other Ambulatory Visit: Payer: Self-pay | Admitting: Oncology

## 2010-10-10 DIAGNOSIS — N644 Mastodynia: Secondary | ICD-10-CM

## 2010-10-13 LAB — URINALYSIS, ROUTINE W REFLEX MICROSCOPIC
Glucose, UA: NEGATIVE
Ketones, ur: NEGATIVE
Leukocytes, UA: NEGATIVE
Protein, ur: NEGATIVE
Urobilinogen, UA: 0.2

## 2010-10-13 LAB — URINE MICROSCOPIC-ADD ON

## 2010-10-13 LAB — BASIC METABOLIC PANEL
Chloride: 105
GFR calc non Af Amer: >60
Potassium: 3.7
Sodium: 141

## 2010-10-22 ENCOUNTER — Ambulatory Visit
Admission: RE | Admit: 2010-10-22 | Discharge: 2010-10-22 | Disposition: A | Discharge status: Home / Self Care | Payer: Medicaid Other | Source: Ambulatory Visit | Attending: Oncology | Admitting: Oncology

## 2010-10-22 DIAGNOSIS — N644 Mastodynia: Secondary | ICD-10-CM

## 2010-10-24 ENCOUNTER — Encounter (HOSPITAL_BASED_OUTPATIENT_CLINIC_OR_DEPARTMENT_OTHER): Payer: Medicaid Other | Admitting: Oncology

## 2010-10-24 ENCOUNTER — Other Ambulatory Visit: Payer: Self-pay | Admitting: Oncology

## 2010-10-24 DIAGNOSIS — Z853 Personal history of malignant neoplasm of breast: Secondary | ICD-10-CM

## 2010-10-24 DIAGNOSIS — H918X9 Other specified hearing loss, unspecified ear: Secondary | ICD-10-CM

## 2010-10-24 DIAGNOSIS — Z09 Encounter for follow-up examination after completed treatment for conditions other than malignant neoplasm: Secondary | ICD-10-CM

## 2010-10-24 LAB — COMPREHENSIVE METABOLIC PANEL
BUN: 11 mg/dL (ref 6–23)
CO2: 24 mEq/L (ref 19–32)
Calcium: 9.4 mg/dL (ref 8.4–10.5)
Chloride: 104 mEq/L (ref 96–112)
Creatinine, Ser: 0.9 mg/dL (ref 0.50–1.10)
Glucose, Bld: 79 mg/dL (ref 70–99)

## 2010-10-24 LAB — CBC WITH DIFFERENTIAL/PLATELET
Basophils Absolute: 0 10*3/uL (ref 0.0–0.1)
HCT: 34.3 % — ABNORMAL LOW (ref 34.8–46.6)
HGB: 11.9 g/dL (ref 11.6–15.9)
MCH: 30.3 pg (ref 25.1–34.0)
MONO#: 0.3 10*3/uL (ref 0.1–0.9)
NEUT%: 61.2 % (ref 38.4–76.8)
WBC: 4.6 10*3/uL (ref 3.9–10.3)
lymph#: 1.4 10*3/uL (ref 0.9–3.3)

## 2011-05-09 ENCOUNTER — Encounter (HOSPITAL_COMMUNITY): Payer: Self-pay | Admitting: Physical Medicine and Rehabilitation

## 2011-05-09 ENCOUNTER — Emergency Department (HOSPITAL_COMMUNITY)
Admission: EM | Admit: 2011-05-09 | Discharge: 2011-05-10 | Disposition: A | Discharge status: Home / Self Care | Payer: Medicaid Other | Attending: Emergency Medicine | Admitting: Emergency Medicine

## 2011-05-09 DIAGNOSIS — R112 Nausea with vomiting, unspecified: Secondary | ICD-10-CM | POA: Insufficient documentation

## 2011-05-09 DIAGNOSIS — R42 Dizziness and giddiness: Secondary | ICD-10-CM | POA: Insufficient documentation

## 2011-05-09 HISTORY — DX: Unspecified hearing loss, unspecified ear: H91.90

## 2011-05-09 LAB — DIFFERENTIAL
Basophils Absolute: 0 10*3/uL (ref 0.0–0.1)
Lymphocytes Relative: 25 % (ref 12–46)
Monocytes Absolute: 0.4 10*3/uL (ref 0.1–1.0)
Monocytes Relative: 6 % (ref 3–12)
Neutro Abs: 5.1 10*3/uL (ref 1.7–7.7)

## 2011-05-09 LAB — URINALYSIS, ROUTINE W REFLEX MICROSCOPIC
Glucose, UA: NEGATIVE mg/dL
Ketones, ur: NEGATIVE mg/dL
Leukocytes, UA: NEGATIVE
Specific Gravity, Urine: 1.02 (ref 1.005–1.030)
pH: 7 (ref 5.0–8.0)

## 2011-05-09 LAB — BASIC METABOLIC PANEL
CO2: 28 mEq/L (ref 19–32)
Chloride: 101 mEq/L (ref 96–112)
Creatinine, Ser: 0.98 mg/dL (ref 0.50–1.10)
Potassium: 3.9 mEq/L (ref 3.5–5.1)

## 2011-05-09 LAB — CBC
HCT: 35.1 % — ABNORMAL LOW (ref 36.0–46.0)
Hemoglobin: 12.3 g/dL (ref 12.0–15.0)
RDW: 12.3 % (ref 11.5–15.5)
WBC: 7.5 10*3/uL (ref 4.0–10.5)

## 2011-05-09 MED ORDER — SODIUM CHLORIDE 0.9 % IV BOLUS (SEPSIS)
1000.0000 mL | Freq: Once | INTRAVENOUS | Status: AC
  Administered 2011-05-09: 1000 mL via INTRAVENOUS

## 2011-05-09 MED ORDER — ONDANSETRON HCL 4 MG/2ML IJ SOLN
INTRAMUSCULAR | Status: AC
  Filled 2011-05-09: qty 2

## 2011-05-09 MED ORDER — MECLIZINE HCL 25 MG PO TABS
25.0000 mg | ORAL_TABLET | Freq: Once | ORAL | Status: AC
  Administered 2011-05-09: 25 mg via ORAL
  Filled 2011-05-09 (×2): qty 1

## 2011-05-09 MED ORDER — ONDANSETRON HCL 4 MG/2ML IJ SOLN
4.0000 mg | Freq: Once | INTRAMUSCULAR | Status: AC
  Administered 2011-05-09: 4 mg via INTRAVENOUS

## 2011-05-09 MED ORDER — LORAZEPAM 2 MG/ML IJ SOLN
1.0000 mg | Freq: Once | INTRAMUSCULAR | Status: AC
  Administered 2011-05-09: 1 mg via INTRAVENOUS
  Filled 2011-05-09: qty 1

## 2011-05-09 MED ORDER — MECLIZINE HCL 25 MG PO TABS
25.0000 mg | ORAL_TABLET | Freq: Once | ORAL | Status: AC
  Administered 2011-05-09: 25 mg via ORAL
  Filled 2011-05-09: qty 1

## 2011-05-09 NOTE — ED Notes (Signed)
Patient currently sitting up in bed; no respiratory or acute distress noted.  Patient updated on plan of care; informed patient that we are going to start an IV and move her to CDU.  Patient has no other questions or concerns at this time; will continue to monitor.

## 2011-05-09 NOTE — ED Notes (Signed)
Pt presents to department for evaluation of dizziness, nausea, and vomiting. States this is a chronic issue, ongoing x2 months. States several episodes of vomiting today, also states headache. She is alert and oriented x4. Pt is deaf, will call for interpreter.

## 2011-05-09 NOTE — ED Notes (Signed)
Discussed plan of care with [place], [place]; pt will stay in ED tonight and have MRI in AM.

## 2011-05-09 NOTE — ED Notes (Signed)
Spoke with the interpreter, she should be here by 19:30.

## 2011-05-09 NOTE — ED Notes (Addendum)
Patient complaining of nausea/vomiting and dizziness over the last two months.  Patient reports last emesis before arrival to hospital (around 1800).  Patient is deaf; family member/friend at bedside to translate at this time.  Sign language interpreter has been called; waiting on arrival.  Upon arrival to room, patient asked to change into gown.  Will continue to monitor.

## 2011-05-09 NOTE — ED Provider Notes (Signed)
History     CSN: [id number]  Arrival date & time 05/09/11  1851   First MD Initiated Contact with Patient 05/09/11 1943      Chief Complaint  Patient presents with  . Dizziness  . Nausea  . Emesis    (Consider location/radiation/quality/duration/timing/severity/associated sxs/prior treatment) HPI Comments: Patient reports intermittent dizziness with associated nausea and vomiting that has been intermittent for the past 1-2 months.  States that she is tired of being dizzy all the time and finally came to the ED because friends have told her there is medication to treat vertigo.  Pt states she was diagnosed last year in the ED with vertigo.    Patient is a 46 y.o. female presenting with vomiting. The history is provided by the patient.  Emesis  Pertinent negatives include no abdominal pain, no cough, no diarrhea and no fever.    Past Medical History  Diagnosis Date  . Cancer 1999    breast-s/p mastectomy, chemo, rad  . Congenital deafness   . CIN I (cervical intraepithelial neoplasia I) 2003    by colpo  . S/P bilateral oophorectomy   . Deaf     Past Surgical History  Procedure Date  . Mastectomy   . Ovarian cyst removal     Family History  Problem Relation Age of Onset  . Hypertension Mother   . Seizures Mother   . Hearing loss Father     History  Substance Use Topics  . Smoking status: Never Smoker   . Smokeless tobacco: Not on file  . Alcohol Use: Yes    OB History    Grav Para Term Preterm Abortions TAB SAB Ect Mult Living                  Review of Systems  Constitutional: Negative for fever.  HENT: Negative for sore throat.   Respiratory: Negative for cough and shortness of breath.   Cardiovascular: Negative for chest pain.  Gastrointestinal: Positive for nausea and vomiting. Negative for abdominal pain and diarrhea.  Genitourinary: Negative for dysuria, urgency, frequency, vaginal bleeding, vaginal discharge and menstrual problem.    Neurological: Positive for dizziness and light-headedness. Negative for syncope, weakness and numbness.    Allergies  Diphenhydramine hcl and Promethazine hcl  Home Medications   Current Outpatient Rx  Name Route Sig Dispense Refill  . NAPROXEN SODIUM 220 MG PO TABS Oral Take 440 mg by mouth 2 (two) times daily as needed. For headache      BP 120/87  Pulse 75  Temp(Src) 98.1 F (36.7 C) (Oral)  Resp 18  SpO2 100%  Physical Exam  Nursing note and vitals reviewed. Constitutional: She is oriented to person, place, and time. She appears well-developed and well-nourished.  HENT:  Head: Normocephalic and atraumatic.  Neck: Neck supple.  Cardiovascular: Normal rate, regular rhythm and normal heart sounds.   Pulmonary/Chest: Breath sounds normal. No respiratory distress. She has no wheezes. She has no rales. She exhibits no tenderness.  Abdominal: Soft. Bowel sounds are normal. There is no tenderness.  Neurological: She is alert and oriented to person, place, and time. She has normal strength. No cranial nerve deficit or sensory deficit. She exhibits normal muscle tone. Gait abnormal. Coordination normal. GCS eye subscore is 4. GCS verbal subscore is 5. GCS motor subscore is 6.       CN II-XII intact, EOMs intact, no nystagmus, no pronator drift, grip strengths equal bilaterally; finger to nose, heel to shin, rapid alternating movements  are normal; strength 5/5 in all extremities, sensation is intact.  Psychiatric: She has a normal mood and affect. Her behavior is normal. Judgment and thought content normal.    ED Course  Procedures (including critical care time)  Labs Reviewed  CBC - Abnormal; Notable for the following:    HCT 35.1 (*)    All other components within normal limits  BASIC METABOLIC PANEL - Abnormal; Notable for the following:    GFR calc non Af Amer 69 (*)    GFR calc Af Amer 80 (*)    All other components within normal limits  URINALYSIS, ROUTINE W REFLEX  MICROSCOPIC - Abnormal; Notable for the following:    APPearance HAZY (*)    All other components within normal limits  DIFFERENTIAL   No results found.  8:48 PM Discussed patient with Dr Lynelle Doctor.  Patient unable to have MRI done tonight as MRI is unavailable tonight.  Plan is for symptomatic control tonight, labs, UA; d/c home with outpatient MRI.    11:12 PM Patient states she is feeling worse than when she came in.  Feels that things are coming towards her and moving away - interpreter states she is not signing very well right now.  I suspect this is due to patient receiving several doses of sedating medications.    11:55 PM Pt reports the ativan is giving her bad dreams and making her feel like she's "imagining things"  Would not recommend further treatment with ativan.    1. Vertigo       MDM  Patient with 1-2 months of vertigo, N/V, hx breast cancer.  Patient with worsening symptoms with ativan (bad dreams, "imagining things"), dizziness not improving.  Plan is for continued monitoring in the CDU overnight with IVF, meclizine as needed.  Pt to have MRI brain in the morning.  Dispo pending results.  Patient signed out to Dr Norlene Campbell who assumes care of patient overnight.         Dillard Cannon [place], [place] 05/10/11 0005

## 2011-05-09 NOTE — ED Notes (Signed)
Pt unwilling to attempt ambulation, per ASL interpreter pt states she is feeling dizzy and that "thing are coming towards me." [place], [place].

## 2011-05-09 NOTE — ED Notes (Signed)
Sign language  Interpreter now at bedside; EDP/PA notified.

## 2011-05-09 NOTE — ED Notes (Signed)
I called for a sign language interpreter @ 18:56.

## 2011-05-10 ENCOUNTER — Encounter (HOSPITAL_COMMUNITY): Payer: Self-pay | Admitting: *Deleted

## 2011-05-10 ENCOUNTER — Emergency Department (HOSPITAL_COMMUNITY): Payer: Medicaid Other

## 2011-05-10 MED ORDER — LORAZEPAM 1 MG PO TABS: 1.0000 mg | ORAL_TABLET | Freq: Three times a day (TID) | ORAL | Status: AC | PRN

## 2011-05-10 MED ORDER — GADOBENATE DIMEGLUMINE 529 MG/ML IV SOLN
15.0000 mL | Freq: Once | INTRAVENOUS | Status: AC | PRN
  Administered 2011-05-10: 15 mL via INTRAVENOUS

## 2011-05-10 MED ORDER — MECLIZINE HCL 25 MG PO TABS: 25.0000 mg | ORAL_TABLET | Freq: Four times a day (QID) | ORAL | Status: DC | PRN

## 2011-05-10 MED ORDER — ONDANSETRON HCL 8 MG PO TABS: 8.0000 mg | ORAL_TABLET | ORAL | Status: AC | PRN

## 2011-05-10 NOTE — ED Provider Notes (Addendum)
History     CSN: [id number]  Arrival date & time 05/09/11  1851   First MD Initiated Contact with Patient 05/09/11 1943      Chief Complaint  Patient presents with  . Dizziness  . Nausea  . Emesis    (Consider location/radiation/quality/duration/timing/severity/associated sxs/prior treatment) HPI  Past Medical History  Diagnosis Date  . Cancer 1999    breast-s/p mastectomy, chemo, rad  . Congenital deafness   . CIN I (cervical intraepithelial neoplasia I) 2003    by colpo  . S/P bilateral oophorectomy   . Deaf     Past Surgical History  Procedure Date  . Mastectomy   . Ovarian cyst removal     Family History  Problem Relation Age of Onset  . Hypertension Mother   . Seizures Mother   . Hearing loss Father     History  Substance Use Topics  . Smoking status: Never Smoker   . Smokeless tobacco: Not on file  . Alcohol Use: Yes    OB History    Grav Para Term Preterm Abortions TAB SAB Ect Mult Living                  Review of Systems  Allergies  Diphenhydramine hcl and Promethazine hcl  Home Medications   Current Outpatient Rx  Name Route Sig Dispense Refill  . NAPROXEN SODIUM 220 MG PO TABS Oral Take 440 mg by mouth 2 (two) times daily as needed. For headache    . LORAZEPAM 1 MG PO TABS Oral Take 1 tablet (1 mg total) by mouth 3 (three) times daily as needed for anxiety. 15 tablet 0  . MECLIZINE HCL 25 MG PO TABS Oral Take 1 tablet (25 mg total) by mouth every 6 (six) hours as needed for dizziness. 15 tablet 0  . ONDANSETRON HCL 8 MG PO TABS Oral Take 1 tablet (8 mg total) by mouth every 4 (four) hours as needed for nausea. 8 tablet 0    BP 108/66  Pulse 66  Temp(Src) 98.5 F (36.9 C) (Oral)  Resp 16  SpO2 99%  Physical Exam  ED Course  Procedures (including critical care time)  Labs Reviewed  CBC - Abnormal; Notable for the following:    HCT 35.1 (*)    All other components within normal limits  BASIC METABOLIC PANEL - Abnormal;  Notable for the following:    GFR calc non Af Amer 69 (*)    GFR calc Af Amer 80 (*)    All other components within normal limits  URINALYSIS, ROUTINE W REFLEX MICROSCOPIC - Abnormal; Notable for the following:    APPearance HAZY (*)    All other components within normal limits  DIFFERENTIAL   Mr Laqueta Jean Wo Contrast  05/10/2011  *RADIOLOGY REPORT*  Clinical Data: Vertigo.  Chronic deafness.  History breast cancer.  MRI HEAD WITHOUT AND WITH CONTRAST  Technique:  Multiplanar, multiecho pulse sequences of the brain and surrounding structures were obtained according to standard protocol without and with intravenous contrast  Contrast: 15mL MULTIHANCE GADOBENATE DIMEGLUMINE 529 MG/ML IV SOLN  Comparison: Head CT 10/19/2007  Findings: Diffusion imaging does not show any acute or subacute infarction.  The brainstem and cerebellum are normal.  The cerebral hemispheres show a few scattered foci of T2 and FLAIR signal within the white matter.  This could indicate an early manifestation of small vessel disease, demyelinating disease, migraine related foci, changes of previous vasculitis or post-traumatic foci.  No cortical or  large vessel territory insult.  No mass lesion, hemorrhage, hydrocephalus or extra-axial collection.  After contrast administration, no abnormal enhancement occurs. There is a small venous angioma in the left side of the pons not of clinical relevance.  Old pituitary gland is normal.  The sinuses, middle ears and mastoids are clear.  IMPRESSION: No acute infarction.  No evidence of metastatic disease.  Scattered foci of abnormal signal in the hemispheric white matter bilaterally.  The differential diagnosis is small vessel ischemic change versus demyelinating disease primarily.  Other less common considerations include migraine related foci, changes of vasculitis and post-traumatic foci.  Original Report Authenticated By: Thomasenia Sales, M.D.     1. Vertigo       MDM  Reexamination 09  15:  Patient is alert, doing much better. Decreased vertigo. No neuro deficits. Deaf interpreter was with me to discuss discharge instructions.  We specifically discussed lab work, MRI results, discharge instructions including medications.        Donnetta Hutching, MD 05/10/11 [id number]  Donnetta Hutching, MD 05/10/11 [phone]

## 2011-05-10 NOTE — Discharge Instructions (Signed)
[facility]up [facility].  Medication for dizziness, nausea, and to help you rest.  Increase fluids.

## 2011-05-10 NOTE — ED Provider Notes (Signed)
Medical screening examination/treatment/procedure(s) were performed by non-physician practitioner and as supervising physician I was immediately available for consultation/collaboration. Devoria Albe, MD, Armando Gang   Ward Givens, MD 05/10/11 1540

## 2011-05-10 NOTE — ED Notes (Signed)
Pt returned from MRI and is asleep in room. Interpreter leaving until mri results are back.

## 2011-05-14 ENCOUNTER — Ambulatory Visit (INDEPENDENT_AMBULATORY_CARE_PROVIDER_SITE_OTHER): Payer: Medicaid Other | Admitting: Family Medicine

## 2011-05-14 ENCOUNTER — Encounter: Payer: Self-pay | Admitting: Family Medicine

## 2011-05-14 VITALS — BP 109/74 | HR 84 | Temp 97.8°F | Ht 66.5 in | Wt 130.0 lb

## 2011-05-14 DIAGNOSIS — R42 Dizziness and giddiness: Secondary | ICD-10-CM

## 2011-05-14 MED ORDER — MECLIZINE HCL 25 MG PO TABS: 25.0000 mg | ORAL_TABLET | Freq: Four times a day (QID) | ORAL | Status: DC | PRN

## 2011-05-14 MED ORDER — MECLIZINE HCL 25 MG PO TABS: 25.0000 mg | ORAL_TABLET | Freq: Four times a day (QID) | ORAL | Status: AC

## 2011-05-14 MED ORDER — ASPIRIN 81 MG PO TBEC: 81.0000 mg | DELAYED_RELEASE_TABLET | Freq: Every day | ORAL | Status: DC

## 2011-05-14 NOTE — Progress Notes (Signed)
  Subjective:    Patient ID: [name], female    DOB: 1965/07/19, 46 y.o.   MRN: [id number]  HPI Patient presents today for followup of vertigo. Patient was seen in the ED on April 27 for nausea and vomiting and had a tentative diagnosis of vertigo. An MRI was obtained which showed findings below:   No acute infarction. No evidence of metastatic disease. Scattered  foci of abnormal signal in the hemispheric white matter  bilaterally. The differential diagnosis is small vessel ischemic  change versus demyelinating disease primarily. Other less common  considerations include migraine related foci, changes of vasculitis  and post-traumatic foci.  Patient was placed on anti-emetics as well as meclizine and was discharged. Patient presents today for followup of this.  Patient states that she has been symptomatically improved since hospital discharge. Patient states she had one episode of emesis on the day of ED visit and has otherwise not needed antibiotics. Patient states she has had persistent dizziness though since hospital discharge has been intermittent in nature and has been somewhat independent of position. Patient states she's only been taking meclizine and symptomatic and has otherwise not been using. Meclizine has helped with dizziness. Patient denies any focal neurological deficits or hemiparesis.   Review of Systems See history of present illness, otherwise 12 point review systems negative.    Objective:   Physical Exam Gen: up in chair, NAD HEENT: NCAT, EOMI, TMs clear bilaterally CV: RRR, no murmurs auscultated PULM: CTAB, no wheezes, rales, rhoncii ABD: S/NT/+ bowel sounds  EXT: 2+ peripheral pulses Neuro: + Horizontal nystagmus bilaterally, Dix-Hallpike markedly positive, otherwise normal neuro exam.   Assessment & Plan:

## 2011-05-14 NOTE — Patient Instructions (Signed)
Vertigo Vertigo means you feel like you or your surroundings are moving when they are not. Vertigo can be dangerous if it occurs when you are at work, driving, or performing difficult activities.  CAUSES  Vertigo occurs when there is a conflict of signals sent to your brain from the visual and sensory systems in your body. There are many different causes of vertigo, including:  Infections, especially in the inner ear.   A bad reaction to a drug or misuse of alcohol and medicines.   Withdrawal from drugs or alcohol.   Rapidly changing positions, such as lying down or rolling over in bed.   A migraine headache.   Decreased blood flow to the brain.   Increased pressure in the brain from a head injury, infection, tumor, or bleeding.  SYMPTOMS  You may feel as though the world is spinning around or you are falling to the ground. Because your balance is upset, vertigo can cause nausea and vomiting. You may have involuntary eye movements (nystagmus). DIAGNOSIS  Vertigo is usually diagnosed by physical exam. If the cause of your vertigo is unknown, your caregiver may perform imaging tests, such as an MRI scan (magnetic resonance imaging). TREATMENT  Most cases of vertigo resolve on their own, without treatment. Depending on the cause, your caregiver may prescribe certain medicines. If your vertigo is related to body position issues, your caregiver may recommend movements or procedures to correct the problem. In rare cases, if your vertigo is caused by certain inner ear problems, you may need surgery. HOME CARE INSTRUCTIONS   Follow your caregiver's instructions.   Avoid driving.   Avoid operating heavy machinery.   Avoid performing any tasks that would be dangerous to you or others during a vertigo episode.   Tell your caregiver if you notice that certain medicines seem to be causing your vertigo. Some of the medicines used to treat vertigo episodes can actually make them worse in some  people.  SEEK IMMEDIATE MEDICAL CARE IF:   Your medicines do not relieve your vertigo or are making it worse.   You develop problems with talking, walking, weakness, or using your arms, hands, or legs.   You develop severe headaches.   Your nausea or vomiting continues or gets worse.   You develop visual changes.   A family member notices behavioral changes.   Your condition gets worse.  MAKE SURE YOU:  Understand these instructions.   Will watch your condition.   Will get help right away if you are not doing well or get worse.  Document Released: 10/08/2004 Document Revised: 12/18/2010 Document Reviewed: 07/17/2010 [employer] Patient Information 2012 [employer]. 

## 2011-05-14 NOTE — Assessment & Plan Note (Addendum)
Likely secondary to vertigo. Will place on scheduled meclizine as well as vestibular rehabilitation. MRI findings are somewhat nonspecific. However, given the questionable white matter lesions and concern for stroke will place patient on a baby aspirin. There are no concerning neurological red flags on her clinical exam today that would concern me for a central process. MRI of the brain ruled this out. Discussed with patient followup with PCP in the next 4-6 weeks pending vestibular rehabilitation. If symptoms persist patient may benefit from a formal neuro referral versus vascular workup to further delineate clinical findings and MRI findings. Neuro red flags were discussed with patient. Will followup as needed.

## 2011-06-07 ENCOUNTER — Emergency Department (HOSPITAL_COMMUNITY)
Admission: EM | Admit: 2011-06-07 | Discharge: 2011-06-08 | Disposition: A | Discharge status: Home / Self Care | Payer: Medicaid Other | Attending: Emergency Medicine | Admitting: Emergency Medicine

## 2011-06-07 ENCOUNTER — Encounter (HOSPITAL_COMMUNITY): Payer: Self-pay | Admitting: *Deleted

## 2011-06-07 DIAGNOSIS — R112 Nausea with vomiting, unspecified: Secondary | ICD-10-CM | POA: Insufficient documentation

## 2011-06-07 DIAGNOSIS — Z853 Personal history of malignant neoplasm of breast: Secondary | ICD-10-CM | POA: Insufficient documentation

## 2011-06-07 DIAGNOSIS — H9190 Unspecified hearing loss, unspecified ear: Secondary | ICD-10-CM | POA: Insufficient documentation

## 2011-06-07 DIAGNOSIS — R42 Dizziness and giddiness: Secondary | ICD-10-CM | POA: Insufficient documentation

## 2011-06-07 DIAGNOSIS — R197 Diarrhea, unspecified: Secondary | ICD-10-CM | POA: Insufficient documentation

## 2011-06-07 NOTE — ED Notes (Signed)
Pt deaf. Pt refuses to use family member as interpreter, and is uncooperative. Pt will sign that she is okay and when asked if she needs anything emergently pt refuses and signs she is ok. Pt family member at bedside to translate that message. Pt waiting for interpeter who has been called.

## 2011-06-07 NOTE — ED Notes (Signed)
The pt has had abd pain with nv  Diarrhea dizziness earache for many weeks.  She has been here 3 times for the same

## 2011-06-08 LAB — COMPREHENSIVE METABOLIC PANEL
Alkaline Phosphatase: 77 U/L (ref 39–117)
BUN: 11 mg/dL (ref 6–23)
CO2: 28 mEq/L (ref 19–32)
Chloride: 103 mEq/L (ref 96–112)
Creatinine, Ser: 0.84 mg/dL (ref 0.50–1.10)
GFR calc non Af Amer: 83 mL/min — ABNORMAL LOW (ref >90)
Glucose, Bld: 99 mg/dL (ref 70–99)
Potassium: 3.8 mEq/L (ref 3.5–5.1)
Total Bilirubin: 1.2 mg/dL (ref 0.3–1.2)

## 2011-06-08 LAB — DIFFERENTIAL
Lymphocytes Relative: 31 % (ref 12–46)
Lymphs Abs: 2 10*3/uL (ref 0.7–4.0)
Monocytes Absolute: 0.4 10*3/uL (ref 0.1–1.0)
Monocytes Relative: 6 % (ref 3–12)
Neutro Abs: 4 10*3/uL (ref 1.7–7.7)
Neutrophils Relative %: 62 % (ref 43–77)

## 2011-06-08 LAB — CBC
HCT: 36.3 % (ref 36.0–46.0)
Hemoglobin: 12.8 g/dL (ref 12.0–15.0)
MCHC: 35.3 g/dL (ref 30.0–36.0)
RBC: 4.27 MIL/uL (ref 3.87–5.11)
WBC: 6.4 10*3/uL (ref 4.0–10.5)

## 2011-06-08 MED ORDER — SODIUM CHLORIDE 0.9 % IV BOLUS (SEPSIS)
1000.0000 mL | Freq: Once | INTRAVENOUS | Status: AC
  Administered 2011-06-08: 1000 mL via INTRAVENOUS

## 2011-06-08 MED ORDER — LORAZEPAM 2 MG/ML IJ SOLN
1.0000 mg | Freq: Once | INTRAMUSCULAR | Status: AC
  Administered 2011-06-08: 1 mg via INTRAVENOUS
  Filled 2011-06-08: qty 1

## 2011-06-08 MED ORDER — ONDANSETRON HCL 4 MG/2ML IJ SOLN
4.0000 mg | Freq: Once | INTRAMUSCULAR | Status: AC
  Administered 2011-06-08: 4 mg via INTRAVENOUS
  Filled 2011-06-08: qty 2

## 2011-06-08 MED ORDER — DROPERIDOL 2.5 MG/ML IJ SOLN
2.5000 mg | Freq: Once | INTRAMUSCULAR | Status: AC
  Administered 2011-06-08: 2.5 mg via INTRAVENOUS
  Filled 2011-06-08: qty 1

## 2011-06-08 MED ORDER — LORAZEPAM 1 MG PO TABS: 1.0000 mg | ORAL_TABLET | Freq: Three times a day (TID) | ORAL | Status: DC | PRN

## 2011-06-08 NOTE — Discharge Instructions (Signed)
Make an appointment with [facility] to try and find out why he you are dizzy and Y. the medications that you have been given so far have not helped.  Vertigo Vertigo means you feel like you or your surroundings are moving when they are not. Vertigo can be dangerous if it occurs when you are at work, driving, or performing difficult activities.  CAUSES  Vertigo occurs when there is a conflict of signals sent to your brain from the visual and sensory systems in your body. There are many different causes of vertigo, including:  Infections, especially in the inner ear.   A bad reaction to a drug or misuse of alcohol and medicines.   Withdrawal from drugs or alcohol.   Rapidly changing positions, such as lying down or rolling over in bed.   A migraine headache.   Decreased blood flow to the brain.   Increased pressure in the brain from a head injury, infection, tumor, or bleeding.  SYMPTOMS  You may feel as though the world is spinning around or you are falling to the ground. Because your balance is upset, vertigo can cause nausea and vomiting. You may have involuntary eye movements (nystagmus). DIAGNOSIS  Vertigo is usually diagnosed by physical exam. If the cause of your vertigo is unknown, your caregiver may perform imaging tests, such as an MRI scan (magnetic resonance imaging). TREATMENT  Most cases of vertigo resolve on their own, without treatment. Depending on the cause, your caregiver may prescribe certain medicines. If your vertigo is related to body position issues, your caregiver may recommend movements or procedures to correct the problem. In rare cases, if your vertigo is caused by certain inner ear problems, you may need surgery. HOME CARE INSTRUCTIONS   Follow your caregiver's instructions.   Avoid driving.   Avoid operating heavy machinery.   Avoid performing any tasks that would be dangerous to you or others during a vertigo episode.   Tell your  caregiver if you notice that certain medicines seem to be causing your vertigo. Some of the medicines used to treat vertigo episodes can actually make them worse in some people.  SEEK IMMEDIATE MEDICAL CARE IF:   Your medicines do not relieve your vertigo or are making it worse.   You develop problems with talking, walking, weakness, or using your arms, hands, or legs.   You develop severe headaches.   Your nausea or vomiting continues or gets worse.   You develop visual changes.   A family member notices behavioral changes.   Your condition gets worse.  MAKE SURE YOU:  Understand these instructions.   Will watch your condition.   Will get help right away if you are not doing well or get worse.  Document Released: 10/08/2004 Document Revised: 12/18/2010 Document Reviewed: 07/17/2010 [facility] Patient Information 2012 [place], [place].  Lorazepam tablets What is this medicine? LORAZEPAM (lor A ze pam) is a benzodiazepine. It is used to treat anxiety. This medicine may be used for other purposes; ask your health care provider or pharmacist if you have questions. What should I tell my health care provider before I take this medicine? They need to know if you have any of these conditions: -alcohol or drug abuse problem -bipolar disorder, depression, psychosis or other mental health condition -glaucoma -kidney or liver disease -lung disease or breathing difficulties -myasthenia gravis -Parkinson's disease -seizures or a history of seizures -suicidal thoughts -an unusual or allergic reaction to lorazepam, other benzodiazepines, foods, dyes, or preservatives -pregnant  or trying to get pregnant -breast-feeding How should I use this medicine? Take this medicine by mouth with a glass of water. Follow the directions on the prescription label. If it upsets your stomach, take it with food or milk. Take your medicine at regular intervals. Do not take it more often than directed. Do not  stop taking except on the advice of your doctor or health care professional. Talk to your pediatrician regarding the use of this medicine in children. Special care may be needed. Overdosage: If you think you have taken too much of this medicine contact a poison control center or emergency room at once. NOTE: This medicine is only for you. Do not share this medicine with others. What if I miss a dose? If you miss a dose, take it as soon as you can. If it is almost time for your next dose, take only that dose. Do not take double or extra doses. What may interact with this medicine? -barbiturate medicines for inducing sleep or treating seizures, like phenobarbital -clozapine -medicines for depression, mental problems or psychiatric disturbances -medicines for sleep -phenytoin -probenecid -theophylline -valproic acid This list may not describe all possible interactions. Give your health care provider a list of all the medicines, herbs, non-prescription drugs, or dietary supplements you use. Also tell them if you smoke, drink alcohol, or use illegal drugs. Some items may interact with your medicine. What should I watch for while using this medicine? Visit your doctor or health care professional for regular checks on your progress. Your body may become dependent on this medicine, ask your doctor or health care professional if you still need to take it. However, if you have been taking this medicine regularly for some time, do not suddenly stop taking it. You must gradually reduce the dose or you may get severe side effects. Ask your doctor or health care professional for advice before increasing or decreasing the dose. Even after you stop taking this medicine it can still affect your body for several days. You may get drowsy or dizzy. Do not drive, use machinery, or do anything that needs mental alertness until you know how this medicine affects you. To reduce the risk of dizzy and fainting spells, do  not stand or sit up quickly, especially if you are an older patient. Alcohol may increase dizziness and drowsiness. Avoid alcoholic drinks. Do not treat yourself for coughs, colds or allergies without asking your doctor or health care professional for advice. Some ingredients can increase possible side effects. What side effects may I notice from receiving this medicine? Side effects that you should report to your doctor or health care professional as soon as possible: -changes in vision -confusion -depression -mood changes, excitability or aggressive behavior -movement difficulty, staggering or jerky movements -muscle cramps -restlessness -weakness or tiredness Side effects that usually do not require medical attention (report to your doctor or health care professional if they continue or are bothersome): -constipation or diarrhea -difficulty sleeping, nightmares -dizziness, drowsiness -headache -nausea, vomiting This list may not describe all possible side effects. Call your doctor for medical advice about side effects. You may report side effects to FDA at 1-800-FDA-1088. Where should I keep my medicine? Keep out of the reach of children. This medicine can be abused. Keep your medicine in a safe place to protect it from theft. Do not share this medicine with anyone. Selling or giving away this medicine is dangerous and against the law. Store at room temperature between 20 and 25 degrees  C (68 and 77 degrees F). Protect from light. Keep container tightly closed. Throw away any unused medicine after the expiration date. NOTE: This sheet is a summary. It may not cover all possible information. If you have questions about this medicine, talk to your doctor, pharmacist, or health care provider.  2012, [employer]. (07/01/2007 2:58:20 PM)

## 2011-06-08 NOTE — ED Notes (Signed)
Patient is AOx4 and comfortable with her discharge instructions.  Patient's mother is driving her home.

## 2011-06-08 NOTE — ED Provider Notes (Signed)
History     CSN: [id number]  Arrival date & time 06/07/11  2231   First MD Initiated Contact with Patient 06/08/11 0014      Chief Complaint  Patient presents with  . multiple  complaints     (Consider location/radiation/quality/duration/timing/severity/associated sxs/prior treatment) The history is provided by the patient. A language interpreter was used (Sign language interpreter).   46 year old female has been complaining of vertigo for the last several months. Vertigo is described as intense spinning sensation. She was seen in the emergency department and in her clinic and treated with meclizine with no improvement. There is associated nausea and vomiting. She has had some intermittent diarrhea. She denies fever, chills, sweats. She has had a left-sided earache. She states that she is off balance and has fallen several times. Symptoms are worse with any kind of movement. Symptoms wax and wane a and she will have good days and bad days. She states yesterday was a better day and today is a worse day.  Past Medical History  Diagnosis Date  . Cancer 1999    breast-s/p mastectomy, chemo, rad  . Congenital deafness   . CIN I (cervical intraepithelial neoplasia I) 2003    by colpo  . S/P bilateral oophorectomy   . Deaf     Past Surgical History  Procedure Date  . Mastectomy   . Ovarian cyst removal     Family History  Problem Relation Age of Onset  . Hypertension Mother   . Seizures Mother   . Hearing loss Father     History  Substance Use Topics  . Smoking status: Never Smoker   . Smokeless tobacco: Not on file  . Alcohol Use: Yes    OB History    Grav Para Term Preterm Abortions TAB SAB Ect Mult Living                  Review of Systems  All other systems reviewed and are negative.    Allergies  Diphenhydramine hcl and Promethazine hcl  Home Medications   Current Outpatient Rx  Name Route Sig Dispense Refill  . ASPIRIN 81 MG PO TBEC Oral Take 1 tablet  (81 mg total) by mouth daily. Swallow whole. 30 tablet 12  . MECLIZINE HCL 25 MG PO TABS Oral Take 25 mg by mouth 4 (four) times daily.      BP 126/74  Pulse 66  Temp(Src) 97.8 F (36.6 C) (Oral)  Resp 18  SpO2 98%  Physical Exam  Nursing note and vitals reviewed.  46 year old female resting comfortably and in no acute distress. Vital signs are normal. Oxygen saturation is 98% which is normal. Head is normocephalic and atraumatic. PERRLA, EOMI. There is no nystagmus. TMs are clear. Oropharynx is clear. Neck is nontender and supple without adenopathy or bruit. Dizziness is reproduced by head movement. Back is nontender. Lungs are clear without rales, wheezes, rhonchi. Heart has regular rate rhythm without murmur. Abdomen is soft, flat, nontender without masses or hepatosplenomegaly. Extremities have full range of motion, no cyanosis or edema. Skin is warm and dry without rash. Neurologic: Mental status is normal, cranial nerves are intact, there are no motor or sensory deficits. Gait is not tested.   ED Course  Procedures (including critical care time)  Results for orders placed during the hospital encounter of 06/07/11  CBC      Component Value Range   WBC 6.4  4.0 - 10.5 (K/uL)   RBC 4.27  3.87 - 5.11 (  MIL/uL)   Hemoglobin 12.8  12.0 - 15.0 (g/dL)   HCT 16.1  09.6 - 04.5 (%)   MCV 85.0  78.0 - 100.0 (fL)   MCH 30.0  26.0 - 34.0 (pg)   MCHC 35.3  30.0 - 36.0 (g/dL)   RDW 40.9  81.1 - 91.4 (%)   Platelets 213  150 - 400 (K/uL)  DIFFERENTIAL      Component Value Range   Neutrophils Relative 62  43 - 77 (%)   Neutro Abs 4.0  1.7 - 7.7 (K/uL)   Lymphocytes Relative 31  12 - 46 (%)   Lymphs Abs 2.0  0.7 - 4.0 (K/uL)   Monocytes Relative 6  3 - 12 (%)   Monocytes Absolute 0.4  0.1 - 1.0 (K/uL)   Eosinophils Relative 2  0 - 5 (%)   Eosinophils Absolute 0.1  0.0 - 0.7 (K/uL)   Basophils Relative 0  0 - 1 (%)   Basophils Absolute 0.0  0.0 - 0.1 (K/uL)  COMPREHENSIVE METABOLIC PANEL        Component Value Range   Sodium 141  135 - 145 (mEq/L)   Potassium 3.8  3.5 - 5.1 (mEq/L)   Chloride 103  96 - 112 (mEq/L)   CO2 28  19 - 32 (mEq/L)   Glucose, Bld 99  70 - 99 (mg/dL)   BUN 11  6 - 23 (mg/dL)   Creatinine, Ser 7.82  0.50 - 1.10 (mg/dL)   Calcium 9.6  8.4 - 95.6 (mg/dL)   Total Protein 7.5  6.0 - 8.3 (g/dL)   Albumin 4.1  3.5 - 5.2 (g/dL)   AST 21  0 - 37 (U/L)   ALT 24  0 - 35 (U/L)   Alkaline Phosphatase 77  39 - 117 (U/L)   Total Bilirubin 1.2  0.3 - 1.2 (mg/dL)   GFR calc non Af Amer 83 (*) >90 (mL/min)   GFR calc Af Amer >90  >90 (mL/min)     1. Vertigo       MDM  History and exam consistent with peripheral vertigo. I have reviewed her ED visit, office visit, and MRI scan. MRI scan did show some lesions that are suspicious for early demyelinating disease and that it is possible that this is an initial presentation of multiple sclerosis. Since she failed to respond to meclizine, she'll be given a therapeutic trial of benzodiazepine.  She had no relief from ondansetron and lorazepam. She will be given a trial of droperidol.  She has had moderate relief with droperidol. Unfortunately, there is no oral formulation for droperidol. She will be sent home with a prescription for Orlie Dakin and she is referred to [facility] for further evaluation.  Dione Booze, MD 06/08/11 [phone]

## 2011-06-17 ENCOUNTER — Emergency Department (HOSPITAL_COMMUNITY)
Admission: EM | Admit: 2011-06-17 | Discharge: 2011-06-17 | Disposition: A | Discharge status: Home / Self Care | Payer: Medicaid Other | Attending: Emergency Medicine | Admitting: Emergency Medicine

## 2011-06-17 ENCOUNTER — Encounter (HOSPITAL_COMMUNITY): Payer: Self-pay | Admitting: Emergency Medicine

## 2011-06-17 ENCOUNTER — Emergency Department (HOSPITAL_COMMUNITY): Payer: Medicaid Other

## 2011-06-17 DIAGNOSIS — S8011XA Contusion of right lower leg, initial encounter: Secondary | ICD-10-CM

## 2011-06-17 DIAGNOSIS — R296 Repeated falls: Secondary | ICD-10-CM | POA: Insufficient documentation

## 2011-06-17 DIAGNOSIS — M79609 Pain in unspecified limb: Secondary | ICD-10-CM | POA: Insufficient documentation

## 2011-06-17 DIAGNOSIS — Z853 Personal history of malignant neoplasm of breast: Secondary | ICD-10-CM | POA: Insufficient documentation

## 2011-06-17 DIAGNOSIS — S8010XA Contusion of unspecified lower leg, initial encounter: Secondary | ICD-10-CM | POA: Insufficient documentation

## 2011-06-17 DIAGNOSIS — W19XXXA Unspecified fall, initial encounter: Secondary | ICD-10-CM

## 2011-06-17 DIAGNOSIS — H9190 Unspecified hearing loss, unspecified ear: Secondary | ICD-10-CM | POA: Insufficient documentation

## 2011-06-17 DIAGNOSIS — R42 Dizziness and giddiness: Secondary | ICD-10-CM | POA: Insufficient documentation

## 2011-06-17 HISTORY — DX: Malignant neoplasm of unspecified site of unspecified female breast: C50.919

## 2011-06-17 LAB — DIFFERENTIAL
Basophils Absolute: 0 10*3/uL (ref 0.0–0.1)
Basophils Relative: 0 % (ref 0–1)
Eosinophils Relative: 1 % (ref 0–5)
Lymphocytes Relative: 19 % (ref 12–46)
Monocytes Absolute: 0.4 10*3/uL (ref 0.1–1.0)
Monocytes Relative: 5 % (ref 3–12)

## 2011-06-17 LAB — CBC
HCT: 36.1 % (ref 36.0–46.0)
Hemoglobin: 12.8 g/dL (ref 12.0–15.0)
MCHC: 35.5 g/dL (ref 30.0–36.0)
MCV: 84.5 fL (ref 78.0–100.0)
RDW: 12.3 % (ref 11.5–15.5)

## 2011-06-17 LAB — BASIC METABOLIC PANEL
BUN: 8 mg/dL (ref 6–23)
CO2: 26 mEq/L (ref 19–32)
Chloride: 103 mEq/L (ref 96–112)
Creatinine, Ser: 0.91 mg/dL (ref 0.50–1.10)

## 2011-06-17 MED ORDER — HYDROCODONE-ACETAMINOPHEN 5-325 MG PO TABS
2.0000 | ORAL_TABLET | Freq: Once | ORAL | Status: AC
  Administered 2011-06-17: 2 via ORAL
  Filled 2011-06-17: qty 2

## 2011-06-17 MED ORDER — DIAZEPAM 5 MG/ML IJ SOLN
5.0000 mg | Freq: Once | INTRAMUSCULAR | Status: AC
  Administered 2011-06-17: 5 mg via INTRAVENOUS

## 2011-06-17 MED ORDER — ONDANSETRON 4 MG PO TBDP
4.0000 mg | ORAL_TABLET | Freq: Once | ORAL | Status: AC
  Administered 2011-06-17: 4 mg via ORAL
  Filled 2011-06-17: qty 1

## 2011-06-17 MED ORDER — DIAZEPAM 5 MG PO TABS: 5.0000 mg | ORAL_TABLET | Freq: Once | ORAL | Status: DC

## 2011-06-17 MED ORDER — SODIUM CHLORIDE 0.9 % IV BOLUS (SEPSIS)
1000.0000 mL | INTRAVENOUS | Status: AC
  Administered 2011-06-17: 1000 mL via INTRAVENOUS

## 2011-06-17 MED ORDER — DIAZEPAM 5 MG/ML IJ SOLN
5.0000 mg | Freq: Once | INTRAMUSCULAR | Status: DC
  Filled 2011-06-17: qty 2

## 2011-06-17 NOTE — ED Provider Notes (Signed)
History     CSN: [id number]  Arrival date & time 06/17/11  1851   First MD Initiated Contact with Patient 06/17/11 2135      Chief Complaint  Patient presents with  . Fall    (Consider location/radiation/quality/duration/timing/severity/associated sxs/prior treatment) Patient is a 46 y.o. female presenting with fall. The history is provided by the patient.  Fall The accident occurred less than 1 hour ago. Incident: while getting up from seated position. Distance fallen: from standing. She landed on a hard floor. There was no blood loss. Point of impact: buttocks. Pain location: right thigh. The pain is moderate. She was not ambulatory at the scene. There was no entrapment after the fall. Associated symptoms include numbness (right thigh). Pertinent negatives include no fever, no abdominal pain, no nausea, no vomiting, no hematuria and no headaches. The symptoms are aggravated by activity. She has tried nothing for the symptoms. The treatment provided no relief.    Past Medical History  Diagnosis Date  . Cancer 1999    breast-s/p mastectomy, chemo, rad  . Congenital deafness   . CIN I (cervical intraepithelial neoplasia I) 2003    by colpo  . S/P bilateral oophorectomy   . Deaf   . Breast cancer     Past Surgical History  Procedure Date  . Mastectomy   . Ovarian cyst removal   . Mastectomy   . Tubal ligation     Family History  Problem Relation Age of Onset  . Hypertension Mother   . Seizures Mother   . Hearing loss Father     History  Substance Use Topics  . Smoking status: Never Smoker   . Smokeless tobacco: Not on file  . Alcohol Use: Yes    OB History    Grav Para Term Preterm Abortions TAB SAB Ect Mult Living                  Review of Systems  Constitutional: Negative for fever and fatigue.  HENT: Negative for congestion, drooling and neck pain.   Eyes: Negative for pain.  Respiratory: Negative for cough and shortness of breath.   Cardiovascular:  Negative for chest pain.  Gastrointestinal: Negative for nausea, vomiting, abdominal pain and diarrhea.  Genitourinary: Negative for dysuria and hematuria.  Musculoskeletal: Negative for back pain and gait problem.  Skin: Negative for color change.  Neurological: Positive for dizziness and numbness (right thigh). Negative for headaches.  Hematological: Negative for adenopathy.  Psychiatric/Behavioral: Negative for behavioral problems.  All other systems reviewed and are negative.    Allergies  Diphenhydramine hcl and Promethazine hcl  Home Medications   Current Outpatient Rx  Name Route Sig Dispense Refill  . LORAZEPAM 1 MG PO TABS Oral Take 1 mg by mouth daily.      BP 122/81  Pulse 74  Temp(Src) 97.7 F (36.5 C) (Oral)  Resp 16  SpO2 98%  Physical Exam  Nursing note and vitals reviewed. Constitutional: She is oriented to person, place, and time. She appears well-developed and well-nourished.  HENT:  Head: Normocephalic.  Mouth/Throat: No oropharyngeal exudate.  Eyes: Conjunctivae and EOM are normal. Pupils are equal, round, and reactive to light.  Neck: Normal range of motion. Neck supple.  Cardiovascular: Normal rate, regular rhythm, normal heart sounds and intact distal pulses.  Exam reveals no gallop and no friction rub.   No murmur heard. Pulmonary/Chest: Effort normal and breath sounds normal. No respiratory distress. She has no wheezes.  Abdominal: Soft. Bowel sounds  are normal. There is no tenderness. There is no rebound and no guarding.  Musculoskeletal: Normal range of motion. She exhibits tenderness (mild to mod ttp of anterior right thigh). She exhibits no edema.  Neurological: She is alert and oriented to person, place, and time. No sensory deficit.       Pt initially has 3/5 strength in RLE now 5/5 after pain control. Pt has 5/5 strength in LLE. Altered light sensation in anterior right thigh.   Skin: Skin is warm and dry.  Psychiatric: She has a normal  mood and affect. Her behavior is normal.    ED Course  Procedures (including critical care time)  Labs Reviewed  BASIC METABOLIC PANEL - Abnormal; Notable for the following:    Glucose, Bld 105 (*)    GFR calc non Af Amer 75 (*)    GFR calc Af Amer 87 (*)    All other components within normal limits  CBC  DIFFERENTIAL   Dg Femur Right  06/17/2011  *RADIOLOGY REPORT*  Clinical Data: Larey Seat.  Right thigh pain.  RIGHT FEMUR - 2 VIEW  Comparison: None  Findings: The hip and knee joints are maintained.  No femur fracture.  IMPRESSION: No acute bony findings.  Original Report Authenticated By: P. Loralie Champagne, M.D.     1. Dizziness   2. Fall   3. Contusion of right leg       MDM  10:58 PM 46 y.o. female pw fall pta. Pt states she got up from sitting and her legs gave out causing her to fall on her buttocks. She denies loc or hitting her head. She has had persistent right thigh pain and numbness since. Pt feels weak in her right leg and intermittent numbness of her entire body. Pt notes mult episodes of emesis after fall. Pt AFVSS here, appears well on exam. Pt initially has 3/5 strength in RLE now 5/5 after pain control. Will get plain film and valium for chronic vertigo.    10:58 PM: Imaging/labs non-contrib. Pt has not had emesis while in ED. Pt feeling better. I have discussed the diagnosis/risks/treatment options with the patient and family and believe the pt to be eligible for discharge home to follow-up with pcp and neurologist as scheduled. We also discussed returning to the ED immediately if new or worsening sx occur. We discussed the sx which are most concerning (e.g., worsening pain, numbness, bowel/bladder incont) that necessitate immediate return. Any new prescriptions provided to the patient are listed below.  New Prescriptions   No medications on file   Clinical Impression 1. Dizziness   2. Fall   3. Contusion of right leg         Purvis Sheffield, MD 06/18/11  [phone]

## 2011-06-17 NOTE — ED Notes (Signed)
PT. FELT DIZZY AND FELL THIS EVENING AT HOME WITH HEADACHE AND VOMITTING AT TRIAGE . DENIES DIARRHEA . PT. IS DEAF - INTERPRETER ASSISTED PT. DURING TRIAGE .

## 2011-06-17 NOTE — ED Notes (Signed)
Patient transported to X-ray 

## 2011-06-17 NOTE — ED Notes (Signed)
Pt reports having some blurry vision when she looks down.

## 2011-06-17 NOTE — ED Notes (Addendum)
Pt c/o right leg pain started today. Pt reports that the numbness and tingling is now all over her body. Pt denies LOC when she fell earlier today, but reports she had a moment of blankness. Interpretor at beside. Pt is crying about the pain. Pt reports severe headache and dizziness. Pt's mother reports pt has just been diagnosed with vertigo.

## 2011-06-18 NOTE — ED Provider Notes (Signed)
I saw and evaluated the patient, reviewed the resident's note and I agree with the findings and plan.  Deaf patient with chronic vertigo with RLE pain after fall. Initial poor effort with exam. No motor or sensory deficits after pain control. No back pain. No incontinence.  Glynn Octave, MD 06/18/11 [phone]

## 2011-07-20 ENCOUNTER — Ambulatory Visit: Payer: Medicaid Other | Admitting: Physical Therapy

## 2011-07-31 ENCOUNTER — Ambulatory Visit: Payer: Medicaid Other | Admitting: Physical Therapy

## 2011-08-06 ENCOUNTER — Ambulatory Visit: Payer: Medicaid Other | Attending: Neurology | Admitting: Physical Therapy

## 2011-08-06 DIAGNOSIS — IMO0001 Reserved for inherently not codable concepts without codable children: Secondary | ICD-10-CM | POA: Insufficient documentation

## 2011-08-06 DIAGNOSIS — R42 Dizziness and giddiness: Secondary | ICD-10-CM | POA: Insufficient documentation

## 2011-08-06 DIAGNOSIS — R269 Unspecified abnormalities of gait and mobility: Secondary | ICD-10-CM | POA: Insufficient documentation

## 2011-11-03 ENCOUNTER — Other Ambulatory Visit: Payer: Self-pay | Admitting: Oncology

## 2011-11-03 DIAGNOSIS — Z1231 Encounter for screening mammogram for malignant neoplasm of breast: Secondary | ICD-10-CM

## 2011-12-07 ENCOUNTER — Ambulatory Visit: Payer: Medicaid Other

## 2012-03-30 ENCOUNTER — Ambulatory Visit: Payer: Medicaid Other

## 2012-04-14 ENCOUNTER — Other Ambulatory Visit: Payer: Self-pay | Admitting: Oncology

## 2012-04-14 ENCOUNTER — Ambulatory Visit
Admission: RE | Admit: 2012-04-14 | Discharge: 2012-04-14 | Disposition: A | Discharge status: Home / Self Care | Payer: Medicaid Other | Source: Ambulatory Visit | Attending: Oncology | Admitting: Oncology

## 2012-04-14 DIAGNOSIS — Z1231 Encounter for screening mammogram for malignant neoplasm of breast: Secondary | ICD-10-CM

## 2012-04-15 ENCOUNTER — Other Ambulatory Visit: Payer: Self-pay | Admitting: Oncology

## 2012-04-15 DIAGNOSIS — R928 Other abnormal and inconclusive findings on diagnostic imaging of breast: Secondary | ICD-10-CM

## 2012-04-20 ENCOUNTER — Telehealth: Payer: Self-pay | Admitting: *Deleted

## 2012-04-20 NOTE — Telephone Encounter (Signed)
Faxed signed form to [facility].

## 2012-04-28 ENCOUNTER — Ambulatory Visit
Admission: RE | Admit: 2012-04-28 | Discharge: 2012-04-28 | Disposition: A | Discharge status: Home / Self Care | Payer: Medicaid Other | Source: Ambulatory Visit | Attending: Oncology | Admitting: Oncology

## 2012-04-28 DIAGNOSIS — R928 Other abnormal and inconclusive findings on diagnostic imaging of breast: Secondary | ICD-10-CM

## 2012-05-12 ENCOUNTER — Telehealth: Payer: Self-pay | Admitting: *Deleted

## 2012-05-12 ENCOUNTER — Encounter: Payer: Self-pay | Admitting: *Deleted

## 2012-05-12 NOTE — Telephone Encounter (Signed)
Patient calling in via video interpreter [phone]. Pt deaf and unable to speak. She recently had mammogram and has questions for Dr Darrold Span. She missed appt in 10/2010, attempted to reschedule and has not rec'd a letter yet. Via interpreter, patient calls and never leaves a message since she needs to speak to someone live. Informed patient we would get her follow up appt made and mail to her. We will also see if we can coordinate with MyChart information systems to generate a code for this patient and her communication needs. This would be much easier to be able to schedule appts. Approx spent with patient/interpreter on phone today.

## 2012-05-16 ENCOUNTER — Other Ambulatory Visit: Payer: Self-pay | Admitting: Oncology

## 2012-05-16 ENCOUNTER — Telehealth: Payer: Self-pay | Admitting: Oncology

## 2012-05-16 DIAGNOSIS — C50919 Malignant neoplasm of unspecified site of unspecified female breast: Secondary | ICD-10-CM

## 2012-05-16 NOTE — Telephone Encounter (Signed)
pt is hearing impaired per pof so i mailed her appt calander and letter .Marland KitchenMarland Kitchen

## 2012-05-31 ENCOUNTER — Ambulatory Visit (HOSPITAL_BASED_OUTPATIENT_CLINIC_OR_DEPARTMENT_OTHER): Payer: Medicaid Other | Admitting: Oncology

## 2012-05-31 ENCOUNTER — Encounter: Payer: Self-pay | Admitting: Oncology

## 2012-05-31 ENCOUNTER — Encounter: Payer: Self-pay | Admitting: Obstetrics & Gynecology

## 2012-05-31 ENCOUNTER — Telehealth: Payer: Self-pay | Admitting: Oncology

## 2012-05-31 ENCOUNTER — Other Ambulatory Visit (HOSPITAL_BASED_OUTPATIENT_CLINIC_OR_DEPARTMENT_OTHER): Payer: Medicaid Other | Admitting: Lab

## 2012-05-31 VITALS — BP 127/76 | HR 61 | Temp 97.7°F | Resp 18 | Ht 66.0 in | Wt 132.6 lb

## 2012-05-31 DIAGNOSIS — Z853 Personal history of malignant neoplasm of breast: Secondary | ICD-10-CM

## 2012-05-31 DIAGNOSIS — H9190 Unspecified hearing loss, unspecified ear: Secondary | ICD-10-CM

## 2012-05-31 DIAGNOSIS — M25559 Pain in unspecified hip: Secondary | ICD-10-CM

## 2012-05-31 DIAGNOSIS — C50919 Malignant neoplasm of unspecified site of unspecified female breast: Secondary | ICD-10-CM

## 2012-05-31 DIAGNOSIS — Z1231 Encounter for screening mammogram for malignant neoplasm of breast: Secondary | ICD-10-CM

## 2012-05-31 LAB — COMPREHENSIVE METABOLIC PANEL (CC13)
ALT: 11 U/L (ref 0–55)
AST: 13 U/L (ref 5–34)
Alkaline Phosphatase: 75 U/L (ref 40–150)
BUN: 6.6 mg/dL — ABNORMAL LOW (ref 7.0–26.0)
Chloride: 106 mEq/L (ref 98–107)
Creatinine: 0.8 mg/dL (ref 0.6–1.1)
Total Bilirubin: 1.72 mg/dL — ABNORMAL HIGH (ref 0.20–1.20)

## 2012-05-31 LAB — CBC WITH DIFFERENTIAL/PLATELET
BASO%: 0.3 % (ref 0.0–2.0)
Basophils Absolute: 0 10*3/uL (ref 0.0–0.1)
EOS%: 2.3 % (ref 0.0–7.0)
HCT: 34.4 % — ABNORMAL LOW (ref 34.8–46.6)
LYMPH%: 31.6 % (ref 14.0–49.7)
MCH: 30.6 pg (ref 25.1–34.0)
MCHC: 35.4 g/dL (ref 31.5–36.0)
MCV: 86.7 fL (ref 79.5–101.0)
MONO%: 5.8 % (ref 0.0–14.0)
NEUT%: 60 % (ref 38.4–76.8)
lymph#: 1.4 10*3/uL (ref 0.9–3.3)

## 2012-05-31 NOTE — Telephone Encounter (Signed)
Gave pt appt for lab and MD on May 2015, scheduled mammogram on 05/31/13, [facility], informed Baird Lyons that i cannot see order, she insist it is there. Called [facility] talked to [place] and she informed me that MD need to redo order so it will drop @ theier work box, informed Dr.Livesay, she will reorder it after she talked to [place] @ [facility]

## 2012-05-31 NOTE — Progress Notes (Signed)
OFFICE PROGRESS NOTE   05/31/2012   Physicians: [facility]  INTERVAL HISTORY:   Patient is seen, together with interpreter, for the first time back at this office since 10-2010, on observation for history of right breast cancer.  Patient had 1.4 cm poorly differentiated right breast carcinoma diagnosed in Nov 1999 at age 47, 1/7 axillary nodes involved, ER/PR and HER 2 positive. She had mastectomy with the 7 axillary node evaluation, 4 cycles of adriamycin/ cytoxan followed by taxotere, then five years of tamoxifen thru June 2005 (no herceptin in 1999). She had bilateral oophorectomy in May 2005. She was briefly on aromatase inhibitor after tamoxifen, but discontinued this herself due to poor tolerance. Most recent left mammogram was done as screening on 04-15-2012 with heterogeneously dense tissue and questioned asymmetry, followed by diagnostic left mamogram on 04-28-12 which had no findings of concern. Patient contacted this office after the follow up mammogram, as she had understood there was possibly another cancer; with assistance of interpreter, Cancer Center RN was able to communicate correct information and set up this visit.  Primary care is thru [facility]. She is overdue gyn exam and would like referral to [facility] gyn clinic, which we will try to arrange.  Review of Systems Intermittent pain in lateral right hip x 2-3 weeks, no other pain, has not tried anything for this. Keeps 4 granddaughters ages infant, toddler, 3 and 4 with lots of lifting and carrying. No recent illness. No respiratory, cardiac, GI or urinary complaints. No swelling LE. No bleeding. Good energy, good appetite. Nothing of concern on self breast exam. Remainder of 10 point Review of Systems negative.  Objective:  Vital signs in last 24 hours:  BP 127/76  Pulse 61  Temp(Src) 97.7 F (36.5 C) (Oral)  Resp 18  Ht 5\' 6"  (1.676 m)  Wt 132 lb 9.6 oz (60.147 kg)  BMI 21.41  kg/m2  Weight is up 10 lbs from 2012. Easily ambulatory. Alert, looks comfortable, communicates easily in sign language with interpreter.  HEENT:PERRLA, sclera clear, anicteric and oropharynx clear, no lesions LymphaticsCervical, supraclavicular, and axillary nodes normal. Resp: clear to auscultation bilaterally and normal percussion bilaterally Cardio: regular rate and rhythm GI: soft, non-tender; bowel sounds normal; no masses,  no organomegaly Extremities: extremities normal, atraumatic, no cyanosis or edema Right hip no rash or ecchymosis and not tender to palpation. Neuro: deafness, otherwise nonfocal Breast: left without mass, skin or nipple findings and nothing in left axilla, no swelling LUE. Right mastectomy scar well healed with no evidence of local recurrence, right axilla benign and no swelling RUE. Skin not remarkable.   Lab Results:  Results for orders placed in visit on 05/31/12  CBC WITH DIFFERENTIAL      Result Value Range   WBC 4.4  3.9 - 10.3 10e3/uL   NEUT# 2.6  1.5 - 6.5 10e3/uL   HGB 12.1  11.6 - 15.9 g/dL   HCT 78.2 (*) 95.6 - 21.3 %   Platelets 186  145 - 400 10e3/uL   MCV 86.7  79.5 - 101.0 fL   MCH 30.6  25.1 - 34.0 pg   MCHC 35.4  31.5 - 36.0 g/dL   RBC 0.86  5.78 - 4.69 10e6/uL   RDW 12.3  11.2 - 14.5 %   lymph# 1.4  0.9 - 3.3 10e3/uL   MONO# 0.3  0.1 - 0.9 10e3/uL   Eosinophils Absolute 0.1  0.0 - 0.5 10e3/uL   Basophils Absolute 0.0  0.0 -  0.1 10e3/uL   NEUT% 60.0  38.4 - 76.8 %   LYMPH% 31.6  14.0 - 49.7 %   MONO% 5.8  0.0 - 14.0 %   EOS% 2.3  0.0 - 7.0 %   BASO% 0.3  0.0 - 2.0 %  COMPREHENSIVE METABOLIC PANEL (CC13)      Result Value Range   Sodium 142  136 - 145 mEq/L   Potassium 3.4 (*) 3.5 - 5.1 mEq/L   Chloride 106  98 - 107 mEq/L   CO2 28  22 - 29 mEq/L   Glucose 88  70 - 99 mg/dl   BUN 6.6 (*) 7.0 - 16.1 mg/dL   Creatinine 0.8  0.6 - 1.1 mg/dL   Total Bilirubin 0.96 (*) 0.20 - 1.20 mg/dL   Alkaline Phosphatase 75  40 - 150 U/L    AST 13  5 - 34 U/L   ALT 11  0 - 55 U/L   Total Protein 6.7  6.4 - 8.3 g/dL   Albumin 3.6  3.5 - 5.0 g/dL   Calcium 8.8  8.4 - 04.5 mg/dL     Studies/Results: MAMMOGRAPHIC UNILATERAL LEFT DIGITAL SCREENING WITH CAD 04-15-2012 Comparison: Previous exams.  FINDINGS:  ACR Breast Density Category 3: The breast tissue is heterogeneously  dense.  In the left breast, a possible asymmetry warrants further  evaluation with spot compression views and possibly ultrasound. In  the right breast, no suspicious masses or malignant type  calcifications are identified.  Images were processed with CAD.  IMPRESSION:  Further evaluation is suggested for possible asymmetry in the left  breast.  RECOMMENDATION:  Diagnostic mammogram and possibly ultrasound of the left breast.  (Code:FI-L-62M)  The patient will be contacted regarding the findings, and  additional imaging will be scheduled.  BI-RADS CATEGORY 0: Incomplete. Need additional imaging  evaluation and/or prior mammograms for comparison.   04-28-2012 Clinical Data: Possible left breast asymmetry at recent screening  mammography. Status post right mastectomy for breast cancer in  1999.  DIGITAL DIAGNOSTIC LEFT MAMMOGRAM WITH CAD  Comparison: Previous examinations, including the screening  mammogram dated 04/14/2012.  Findings:  ACR Breast Density Category 3: The breast tissue is heterogeneously  dense.  True lateral and spot compression oblique views of the left breast  demonstrate normal appearing glandular tissue at the location of  recently suspected asymmetry, unchanged compared to previous  examinations.  Mammographic images were processed with CAD.  IMPRESSION:  No evidence of malignancy. The recently suspected left breast  asymmetry represented overlapping of normal glandular tissue.  RECOMMENDATION:  Left screening mammogram in 1 year.  I have discussed the findings and recommendations with the patient.  Results were also  provided in writing at the conclusion of the  visit. If applicable, a reminder letter will be sent to the  patient regarding her next appointment.    Xray 06-2011 right femur "hip and knee joints maintained, no fracture"   With assistance of interpreter, we have discussed the mammogram reports as above. I have recommended 3D/ tomo mammography for next imaging due to dense breast tissue.  Medications: I have reviewed the patient's current medications. She will try aleve bid with food for next ~ week for the hip discomfort, and will let MD know if not resolved.  Assessment/Plan: 1.T1N1 premenopausal right breast carcinoma ER/PR +, HER 2 + diagnosed Nov 1999, post treatment as above and now on observation. With features of the cancer, treatment with chemotherapy, deafness and rotation of PCP in family  practice clinic, it has seemed best to continue some follow up at this office. She will return in one year, or sooner if problems. I do not know that we have been able to arrange genetics counseling in past. 2.post surgical oophorectomy. Bone density scan would be consideration. Overdue pelvic exam and PAP, which she would prefer at gyn clinic, referral attempted today but appointment will have to be mailed to her 3.right hip pain: likely related to lifting and carrying the young grandchildren. She will try aleve regularly as above, be aware of carrying children and let us know if does not resolve. 4.congenital deafness which makes appointment communication difficult at times.  Patient was in agreement with plan above and had questions answered to her satisfaction.   Talmadge Ganas P, MD   05/31/2012, 12:59 PM

## 2012-05-31 NOTE — Patient Instructions (Signed)
Try Aleve or ibuprofen twice daily with food for next week for the hip pain. Let Dr Darrold Span know if this does not help. Be aware of how you are holding the babies, as this may be straining your hip.  You are due for pelvic exam and PAP test, which we will set up at gyn clinic at [facility]  We will get 3D/ tomo mammograms next year, which will give good pictures since your breast tissue is dense. The follow up mammogram from 04-28-12 was fine; (the first mammogram from 04-14-12 did not get the breast smoothly compressed, so there was an area that the radiologist wasn't seeing well enough)

## 2012-06-01 ENCOUNTER — Telehealth: Payer: Self-pay | Admitting: Oncology

## 2012-06-01 NOTE — Telephone Encounter (Signed)
Mailed pt appt for may 2015 and [facility] appt for june 2014

## 2012-06-22 ENCOUNTER — Encounter: Payer: Self-pay | Admitting: Nurse Practitioner

## 2012-06-22 ENCOUNTER — Ambulatory Visit (INDEPENDENT_AMBULATORY_CARE_PROVIDER_SITE_OTHER): Payer: Medicaid Other | Admitting: Nurse Practitioner

## 2012-06-22 VITALS — HR 67 | Ht 65.0 in | Wt 131.0 lb

## 2012-06-22 DIAGNOSIS — G43019 Migraine without aura, intractable, without status migrainosus: Secondary | ICD-10-CM

## 2012-06-22 DIAGNOSIS — H9190 Unspecified hearing loss, unspecified ear: Secondary | ICD-10-CM

## 2012-06-22 DIAGNOSIS — Z79899 Other long term (current) drug therapy: Secondary | ICD-10-CM

## 2012-06-22 DIAGNOSIS — R42 Dizziness and giddiness: Secondary | ICD-10-CM

## 2012-06-22 LAB — COMPREHENSIVE METABOLIC PANEL
ALT: 10 IU/L (ref 0–32)
AST: 13 IU/L (ref 0–40)
Albumin/Globulin Ratio: 2 (ref 1.1–2.5)
BUN/Creatinine Ratio: 14 (ref 9–23)
Calcium: 9.6 mg/dL (ref 8.7–10.2)
Creatinine, Ser: 0.85 mg/dL (ref 0.57–1.00)
GFR calc Af Amer: 95 mL/min/{1.73_m2} (ref >59)
GFR calc non Af Amer: 82 mL/min/{1.73_m2} (ref >59)
Globulin, Total: 2.2 g/dL (ref 1.5–4.5)
Potassium: 4 mmol/L (ref 3.5–5.2)
Sodium: 140 mmol/L (ref 134–144)
Total Bilirubin: 1.7 mg/dL — ABNORMAL HIGH (ref 0.0–1.2)

## 2012-06-22 MED ORDER — DIVALPROEX SODIUM 250 MG PO DR TAB: 250.0000 mg | DELAYED_RELEASE_TABLET | Freq: Every day | ORAL | Status: DC

## 2012-06-22 NOTE — Patient Instructions (Addendum)
Continue Depakote at current dose.  Will check liver function today. Given information on migraine triggers.  F/U 6 months

## 2012-06-22 NOTE — Progress Notes (Signed)
HPI: Patient returns for followup after previous visits with Dr. Pearlean Brownie. 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 She also has a history of headaches refractory migraine but have improved after Depakote was started. Her headaches are triggered by stress and anxiety. She is not aware of other triggers on return visit today she states her last headache was several weeks ago and they are mild at this point she denies any dizziness at present. She has not had labs checked since being on Depakote. She has congenital deafness and has an interpreter with her today  ROS:  Joint pain,  Insomnia, mild headaches, rare dizziness  Physical Exam General: well developed, well nourished, seated, in no evident distress Head: head normocephalic and atraumatic. Oropharynx benign Neck: supple with no carotid  bruits Cardiovascular: regular rate and rhythm, no murmurs  Neurologic Exam Mental Status: Awake and fully alert. Oriented to place and time. Follows all commands. Speech and language cannot be tested, patient is deaf and mute.   Cranial Nerves: Fundoscopic exam reveals sharp disc margins. Pupils equal, briskly reactive to light. Extraocular movements full without nystagmus. Visual fields full to confrontation. Pt is deaf.  Face, tongue, palate move normally and symmetrically. Neck flexion and extension normal.  Motor: Normal bulk and tone. Normal strength in all tested extremity muscles.No focal weakness Sensory.: intact to touch and pinprick and vibratory.  Coordination: Rapid alternating movements normal in all extremities. Finger-to-nose and heel-to-shin performed accurately bilaterally. Gait and Station: Arises from chair without difficulty. Stance is normal.  Able to heel, toe and tandem walk without difficulty.  Reflexes: 2+ and symmetric. Toes downgoing.      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 She also has migraine headaches which have improved with Depakote.     PLAN: Continue Depakote at current dose, will refill.  Will check liver function today. Given information on migraine triggers to include foods, environmental etc.   F/U 6 months  Nilda Riggs, GNP-BC APRN

## 2012-06-23 ENCOUNTER — Telehealth: Payer: Self-pay | Admitting: *Deleted

## 2012-06-23 NOTE — Telephone Encounter (Signed)
Message copied by Hermenia Fiscal on Thu Jun 23, 2012  9:37 AM ------      Message from: Beverely Low      Created: Thu Jun 23, 2012  8:32 AM       Labs look good. Please call patient            ----- Message -----         From: [facility] Lab Results In Interface         Sent: 06/22/2012   4:39 PM           To: Nilda Riggs, NP                   ------

## 2012-06-23 NOTE — Telephone Encounter (Signed)
I called and LMVM for pt that her labs looked good.  She is to call back if questions.

## 2012-06-30 ENCOUNTER — Encounter: Payer: Medicaid Other | Admitting: Obstetrics & Gynecology

## 2012-08-24 ENCOUNTER — Encounter: Payer: Self-pay | Admitting: Family Medicine

## 2012-08-24 ENCOUNTER — Encounter (INDEPENDENT_AMBULATORY_CARE_PROVIDER_SITE_OTHER): Payer: Medicaid Other | Admitting: Obstetrics & Gynecology

## 2012-08-25 ENCOUNTER — Encounter: Payer: Self-pay | Admitting: Obstetrics & Gynecology

## 2012-08-25 NOTE — Progress Notes (Signed)
Patient ID: [name], female   DOB: 25-Nov-1965, 47 y.o.   MRN: [id number] Pt was scheduled for a female provider. Did not want to see a female and could not wait to see a female provider due to another appt.  Pt was NOT seen or examined by a physician.  clh-S

## 2012-10-07 ENCOUNTER — Ambulatory Visit (INDEPENDENT_AMBULATORY_CARE_PROVIDER_SITE_OTHER): Payer: Medicaid Other | Admitting: Family

## 2012-10-07 VITALS — BP 116/77 | HR 66 | Temp 98.7°F | Wt 131.4 lb

## 2012-10-07 DIAGNOSIS — Z124 Encounter for screening for malignant neoplasm of cervix: Secondary | ICD-10-CM

## 2012-10-07 DIAGNOSIS — Z01419 Encounter for gynecological examination (general) (routine) without abnormal findings: Secondary | ICD-10-CM

## 2012-10-13 NOTE — Progress Notes (Signed)
Pt not seen by provider, needed to reschedule.

## 2012-10-14 ENCOUNTER — Other Ambulatory Visit (HOSPITAL_COMMUNITY)
Admission: RE | Admit: 2012-10-14 | Discharge: 2012-10-14 | Disposition: A | Discharge status: Home / Self Care | Payer: Medicaid Other | Source: Ambulatory Visit | Attending: Medical | Admitting: Medical

## 2012-10-14 ENCOUNTER — Encounter: Payer: Self-pay | Admitting: Medical

## 2012-10-14 ENCOUNTER — Ambulatory Visit (INDEPENDENT_AMBULATORY_CARE_PROVIDER_SITE_OTHER): Payer: Medicaid Other | Admitting: Medical

## 2012-10-14 VITALS — BP 125/81 | HR 57 | Temp 96.7°F | Ht 66.0 in | Wt 130.0 lb

## 2012-10-14 DIAGNOSIS — Z1151 Encounter for screening for human papillomavirus (HPV): Secondary | ICD-10-CM | POA: Insufficient documentation

## 2012-10-14 DIAGNOSIS — Z01419 Encounter for gynecological examination (general) (routine) without abnormal findings: Secondary | ICD-10-CM

## 2012-10-14 NOTE — Progress Notes (Signed)
Patient ID: [name], female   DOB: October 05, 1965, 47 y.o.   MRN: [id number]  Subjective:    [name] is a 47 y.o. female who presents for annual exam. The patient has no complaints today. The patient is sexually active. GYN screening history: last pap: approximate date 2009 and was normal. The patient is not taking hormone replacement therapy. Patient denies post-menopausal vaginal bleeding.. The patient wears seatbelts: yes. The patient participates in regular exercise: yes. Has the patient ever been transfused or tattooed?: transfusion - no; tattoos x 6. The patient reports that there is not domestic violence in her life.   Menstrual History: OB History   Grav Para Term Preterm Abortions TAB SAB Ect Mult Living   2 2 1 1  0 0 0 0 0 2      No LMP recorded. Patient is postmenopausal.    The following portions of the patient's history were reviewed and updated as appropriate: allergies, current medications, past family history, past medical history, past social history, past surgical history and problem list.  Review of Systems Pertinent items are noted in HPI.    Objective:     BP 125/81  Pulse 57  Temp(Src) 96.7 F (35.9 C) (Oral)  Ht 5\' 6"  (1.676 m)  Wt 130 lb (58.968 kg)  BMI 20.99 kg/m2 GENERAL: Well-developed, well-nourished female in no acute distress.  HEENT: Normocephalic, atraumatic. Sclerae anicteric.  NECK: Supple. Normal thyroid.  LUNGS: Clear to auscultation bilaterally.  HEART: Regular rate and rhythm. BREASTS: Symmetric in size. No masses, skin changes, nipple drainage, or lymphadenopathy. ABDOMEN: Soft, nontender, nondistended. No organomegaly. PELVIC: Normal external female genitalia. Vagina is pink and rugated.  Normal discharge. Normal cervix contour. Pap smear obtained. Uterus is normal in size. No adnexal mass or tenderness.  EXTREMITIES: No cyanosis, clubbing, or edema, 2+ distal pulses.       Assessment:    Normal gyn exam    Plan:    1. Patient will be contacted with any abnormal results  2. Patient will be due for next pap smear in 2017 3. Patient will return in 1 year for annual exam or sooner PRN

## 2012-10-14 NOTE — Patient Instructions (Signed)
Pap Test Information A Pap test is a medical test done to evaluate cells that are on the surface of the cervix. The cervix is the lower portion of the uterus and upper portion of the vagina. This test is done in a clinic office and can detect certain infections, abnormal cervical cells, or cervical cancer. For some women, the cervical region has the potential to form cancer. With consistent evaluations, cervical cancer is a rare and preventable kind of cancer.  DIFFERENT WAYS TO PERFORM A PAP TEST There are 2 ways a Pap test may be done. The main difference between the 2 ways has to do with what instruments are used to get a cell sample and how the cells are tested. In both tests, a warm metal or plastic instrument (speculum) is placed in the vagina. The speculum allows the caregiver to see the inside of the vagina and to look at the cervix. The caregiver will use either a wooden spatula or plastic brush to collect cervical cells. If a wooden spatula was used, the cervical cells are placed on a glass slide and sent to the lab to be examined. If a plastic brush was used, the cervical cells are retrieved and both the plastic brush and cells are placed in a container with fluid, and the cells are examined. In addition, the fluid in the container can also be checked for human papillomavirus (HPV) and other infections, such as gonorrhea and chlamydia. RISK FACTORS FOR CERVICAL CANCER  Risk factors for cervical cancer are things that increase your chance of getting cervical cancer. These things include:  Having had an abnormal Pap test or cancer of the vagina or vulva and avoiding proper medical follow up and care.  Becoming sexually active before age 18.  Smoking.  Having a weakened immune system. HIV or other immunodeficiency disorders weaken the immune system.   Having had a sexually transmitted infection (STI), such as chlamydia, gonorrhea, or HPV.   Having a sexual partner who has a history of  direct or indirect contact with cervical cancer.   Not using condoms with new sexual partners.   Being the daughter of a woman who took diethylstilbestrol (DES) during pregnancy. HOW TO PREPARE FOR A PAP TEST  Schedule your Pap test for a time when you are not likely to be on your period, or ask if it is okay to be on your period during the Pap test. Depending on the method of Pap test collection, your caregiver may not be able to perform the test if you are on your period or if your period is very heavy.  Avoid douching before the Pap test. This may lessen the number of cells that are being evaluated.  Refrain from intercourse the day before your Pap test, or as directed by your caregiver. If you have never had a Pap test, ask your caregiver how it is done. He or she may take extra time to show and explain the speculum and collecting system used during the test.  HOW OFTEN SHOULD YOU HAVE PAP TESTS? How often you should get a Pap test depends on your age and health history. Talk to your caregiver about how often you should get tested.  PAP RECOMMENDATIONS BASED ON AGE The recommendations below are the same for women who have or have not gotten the vaccine for HPV. Some women may need screenings more often if they are at high risk for cervical cancer.   The first Pap test should be done at   age 21.  Between ages 21 and 29, have a Pap test every 2 or 3 years.  If you are 47 years old and your last 3 Pap tests have been normal, Pap tests are needed no more often than every 3 years. This should be approved by your caregiver.  If you are 65 years old or older, ask your caregiver if you can stop having Pap tests. PAP TEST RECOMMENDATIONS BASED ON HEALTH HISTORY Confirm the recommendations below with your caregiver. It is recommended that:  You have your first Pap test before the age of 21if you are sexually active or are immunocompromised (HIV, lupus, or taking transplant medicines).  You  have more frequent Pap tests if you have a weakened immune system due to HIV, lupus, or you are taking transplant medicines; if your mother was exposed to DES while pregnant with you; or if you have cervical intraepithelial neoplasia (CIN) 2 or 3.  You have a Pap test and screenings for cancer for at least 20 years after treatment for cervical cancer or a condition that could lead to cancer. Talk to your caregiver if a new problem develops soon after your last Pap test. YOU DO NOT NEED REGULAR PAP TESTS IF:   You had a hysterectomy for a problem that was not a cancer or a condition that could lead to cancer. However, even if you no longer need a Pap test, a regular exam is a good idea to make sure other serious problems are not starting.  If you are between ages 65 and 70, and you have had normal Pap tests for 10 years, you no longer need Pap tests.However, even if you no longer need a Pap test, a regular exam is a good idea to make sure other serious problems are not starting. If Pap tests have been discontinued, risk factors (such as a new sexual partner) need to be re-evaluated to determine if screening should be resumed. RESULTS OF YOUR PAP TEST A healthy Pap test shows no abnormal cells or evidence of infection. However, inflammation of the cervix (cervicitis) is a common Pap test result but is of no significance.  The presence of abnormally growing cells on the surface of the cervix may be reported as an abnormal Pap test. If a Pap test is abnormal, it is most often a result of a previous exposure to HPV. HPV may infect the cells of the cervix and cause dysplasia. Dysplasia is where the cells no longer look normal. If a person has been diagnosed with high-grade dysplasia or severe dysplasia, they are at higher risk of developing cervical cancer. A woman diagnosed with low-grade dysplasia should still be seen by her caregiver. Low-grade dysplasia still has a small risk of developing into cancer.  Your caregiver will determine what follow-up care is needed or when you should have your next Pap test.  If you have had an abnormal Pap test:   You may be asked to have a colposcopy. This is a test in which the cervix is viewed with a lighted microscope.   A cervical tissue sample (biopsy) may also be needed. This involves taking a small tissue sample from the cervix. Document Released: 03/23/2011 Document Reviewed: 03/23/2011 [employer] Patient Information 2014 [employer].  

## 2013-04-27 ENCOUNTER — Encounter: Payer: Self-pay | Admitting: Family Medicine

## 2013-04-27 ENCOUNTER — Ambulatory Visit (INDEPENDENT_AMBULATORY_CARE_PROVIDER_SITE_OTHER): Payer: Medicaid Other | Admitting: Family Medicine

## 2013-04-27 VITALS — BP 115/75 | HR 85 | Temp 97.7°F | Wt 139.0 lb

## 2013-04-27 DIAGNOSIS — Z1211 Encounter for screening for malignant neoplasm of colon: Secondary | ICD-10-CM

## 2013-04-27 DIAGNOSIS — Z853 Personal history of malignant neoplasm of breast: Secondary | ICD-10-CM

## 2013-04-27 NOTE — Patient Instructions (Signed)
Colonoscopy A colonoscopy is an exam to look at the entire large intestine (colon). This exam can help find problems such as tumors, polyps, inflammation, and areas of bleeding. The exam takes about 1 hour.  LET YOUR HEALTH CARE PROVIDER KNOW ABOUT:   Any allergies you have.  All medicines you are taking, including vitamins, herbs, eye drops, creams, and over-the-counter medicines.  Previous problems you or members of your family have had with the use of anesthetics.  Any blood disorders you have.  Previous surgeries you have had.  Medical conditions you have. RISKS AND COMPLICATIONS  Generally, this is a safe procedure. However, as with any procedure, complications can occur. Possible complications include:  Bleeding.  Tearing or rupture of the colon wall.  Reaction to medicines given during the exam.  Infection (rare). BEFORE THE PROCEDURE   Ask your health care provider about changing or stopping your regular medicines.  You may be prescribed an oral bowel prep. This involves drinking a large amount of medicated liquid, starting the day before your procedure. The liquid will cause you to have multiple loose stools until your stool is almost clear or light green. This cleans out your colon in preparation for the procedure.  Do not eat or drink anything else once you have started the bowel prep, unless your health care provider tells you it is safe to do so.  Arrange for someone to drive you home after the procedure. PROCEDURE   You will be given medicine to help you relax (sedative).  You will lie on your side with your knees bent.  A long, flexible tube with a light and camera on the end (colonoscope) will be inserted through the rectum and into the colon. The camera sends video back to a computer screen as it moves through the colon. The colonoscope also releases carbon dioxide gas to inflate the colon. This helps your health care provider see the area better.  During  the exam, your health care provider may take a small tissue sample (biopsy) to be examined under a microscope if any abnormalities are found.  The exam is finished when the entire colon has been viewed. AFTER THE PROCEDURE   Do not drive for 24 hours after the exam.  You may have a small amount of blood in your stool.  You may pass moderate amounts of gas and have mild abdominal cramping or bloating. This is caused by the gas used to inflate your colon during the exam.  Ask when your test results will be ready and how you will get your results. Make sure you get your test results. Document Released: 12/27/1999 Document Revised: 10/19/2012 Document Reviewed: 09/05/2012 [employer] Patient Information 2014 [employer].  

## 2013-04-27 NOTE — Progress Notes (Signed)
Subjective:     Patient ID: [name], female   DOB: Jan 15, 1965, 48 y.o.   MRN: [id number]  HPI Breast cancer:BC in 1999-2000,s/p mastectomy and chemo.Last mammogram was recently 2-3 months ago,and it was normal. Cancer screening:Concern about colon concern since she has hx of BC. She denies any GI symptoms, no weight loss, she will like to get colonoscopy done. Denies hx of first degree relative with colon cancer.  Current Outpatient Prescriptions on File Prior to Visit  Medication Sig Dispense Refill  . divalproex (DEPAKOTE) 250 MG DR tablet Take 1 tablet (250 mg total) by mouth daily.  30 tablet  6  . Menthol-Methyl Salicylate (BENGAY ARTHRITIS FORMULA EX) Apply topically as needed.       No current facility-administered medications on file prior to visit.   Past Medical History  Diagnosis Date  . Congenital deafness   . CIN I (cervical intraepithelial neoplasia I) 2003    by colpo  . S/P bilateral oophorectomy   . Deaf   . Cancer 1999    breast-s/p mastectomy, chemo, rad  . Breast cancer   . Abnormal Pap smear      Review of Systems  Respiratory: Negative.   Cardiovascular: Negative.   Gastrointestinal: Negative.   Genitourinary: Negative.   All other systems reviewed and are negative.  Filed Vitals:   04/27/13 1445  BP: 115/75  Pulse: 85  Temp: 97.7 F (36.5 C)  TempSrc: Oral  Weight: 139 lb (63.05 kg)       Objective:   Physical Exam  Nursing note and vitals reviewed. Constitutional: She appears well-developed. No distress.  Cardiovascular: Normal rate, regular rhythm, normal heart sounds and intact distal pulses.   No murmur heard. Pulmonary/Chest: Effort normal and breath sounds normal. No respiratory distress. She has no wheezes.  Abdominal: Soft. Bowel sounds are normal. She exhibits no distension and no mass. There is no tenderness.  Musculoskeletal: Normal range of motion. She exhibits no edema.       Assessment:     Hx of breast  cancer Colon cancer screening     Plan:     Check problem list.

## 2013-04-28 DIAGNOSIS — Z1211 Encounter for screening for malignant neoplasm of colon: Secondary | ICD-10-CM | POA: Insufficient documentation

## 2013-04-28 DIAGNOSIS — Z853 Personal history of malignant neoplasm of breast: Secondary | ICD-10-CM | POA: Insufficient documentation

## 2013-04-28 NOTE — Assessment & Plan Note (Addendum)
No recurrence. Last Mammogram in April 2014. [facility], she will call for follow up.  NB: I called patient and left message again to remind her to schedule mammogram.

## 2013-04-28 NOTE — Assessment & Plan Note (Signed)
Indication for colon cancer screening discussed with patient. She will like to get screened. Referral to GI for second opinion done.

## 2013-05-22 ENCOUNTER — Telehealth: Payer: Self-pay | Admitting: Neurology

## 2013-05-22 NOTE — Telephone Encounter (Signed)
Ok to stop depakote for headaches but call back if they worsen

## 2013-05-22 NOTE — Telephone Encounter (Signed)
Patient's Interpreter called and questioning if she could stop taking divalproex (DEPAKOTE) 250 MG DR tablet.  She has 1 pill left and would like a return call today.  Thanks

## 2013-05-22 NOTE — Telephone Encounter (Signed)
Patient is tired of taking the medicine, and heard that with long term use cause liver and kidney damage, is already taking too many medicines

## 2013-05-23 NOTE — Telephone Encounter (Signed)
Patient has a hearing loss answering machine, lt message of Dr Clydene Fake note below. They will deliver the message to patient.

## 2013-05-26 ENCOUNTER — Telehealth: Payer: Self-pay | Admitting: Ophthalmology

## 2013-05-26 ENCOUNTER — Telehealth: Payer: Self-pay | Admitting: Neurology

## 2013-05-26 NOTE — Telephone Encounter (Signed)
FYI--Patient calling to let Lovey Newcomer know that she back on the divalproex (DEPAKOTE) 250 MG DR tablet and she has two refills left.  Thanks

## 2013-05-26 NOTE — Telephone Encounter (Signed)
Pt calling to rely message to [place], [place]. FYI

## 2013-05-26 NOTE — Telephone Encounter (Signed)
FYI--Patient calling to let Lovey Newcomer know that she back on the divalproex (DEPAKOTE) 250 MG DR tablet and she has two refills left. Thanks (From [employer]) message  Pt called back with Interpreter saying she missed a call, I told her I'm not sure who it was but that I would send the message back and have them call her back. Thanks

## 2013-05-26 NOTE — Telephone Encounter (Signed)
Pt's information was sent to [place], [place].

## 2013-05-30 NOTE — Telephone Encounter (Signed)
I called and LMVM for pt to return call to clarify what she needs or wants.

## 2013-05-31 ENCOUNTER — Ambulatory Visit: Payer: Medicaid Other | Admitting: Oncology

## 2013-05-31 ENCOUNTER — Other Ambulatory Visit: Payer: Medicaid Other | Admitting: Lab

## 2013-06-02 ENCOUNTER — Other Ambulatory Visit: Payer: Medicaid Other

## 2013-06-02 ENCOUNTER — Other Ambulatory Visit: Payer: Self-pay | Admitting: Internal Medicine

## 2013-06-02 ENCOUNTER — Ambulatory Visit: Payer: Medicaid Other

## 2013-06-02 DIAGNOSIS — Z853 Personal history of malignant neoplasm of breast: Secondary | ICD-10-CM

## 2013-06-25 ENCOUNTER — Other Ambulatory Visit: Payer: Self-pay | Admitting: Neurology

## 2013-06-26 NOTE — Telephone Encounter (Signed)
Pt calling to request refill for divalproex (DEPAKOTE) 250 MG DR tablet.  Please call and advise.

## 2013-06-27 ENCOUNTER — Telehealth: Payer: Self-pay | Admitting: *Deleted

## 2013-06-27 NOTE — Telephone Encounter (Signed)
Rx was sent yesterday.  I called back.  This was a "relay" call.  The patient did not answer, so a message was left with the Primary school teacher.  She will notify the patient.

## 2013-07-18 ENCOUNTER — Ambulatory Visit (INDEPENDENT_AMBULATORY_CARE_PROVIDER_SITE_OTHER): Payer: Medicaid Other | Admitting: Nurse Practitioner

## 2013-07-18 ENCOUNTER — Encounter: Payer: Self-pay | Admitting: Nurse Practitioner

## 2013-07-18 ENCOUNTER — Encounter (INDEPENDENT_AMBULATORY_CARE_PROVIDER_SITE_OTHER): Payer: Self-pay

## 2013-07-18 VITALS — BP 115/71 | HR 64 | Ht 65.0 in | Wt 137.0 lb

## 2013-07-18 DIAGNOSIS — G43019 Migraine without aura, intractable, without status migrainosus: Secondary | ICD-10-CM

## 2013-07-18 DIAGNOSIS — R42 Dizziness and giddiness: Secondary | ICD-10-CM

## 2013-07-18 DIAGNOSIS — Z5181 Encounter for therapeutic drug level monitoring: Secondary | ICD-10-CM

## 2013-07-18 LAB — CBC WITH DIFFERENTIAL/PLATELET
BASOS ABS: 0 10*3/uL (ref 0.0–0.2)
Basos: 0 %
EOS ABS: 0.2 10*3/uL (ref 0.0–0.4)
Eos: 3 %
HCT: 35.8 % (ref 34.0–46.6)
Hemoglobin: 12.5 g/dL (ref 11.1–15.9)
Lymphocytes Absolute: 1.7 10*3/uL (ref 0.7–3.1)
Lymphs: 34 %
MCH: 30 pg (ref 26.6–33.0)
MCHC: 34.9 g/dL (ref 31.5–35.7)
MCV: 86 fL (ref 79–97)
MONOS ABS: 0.4 10*3/uL (ref 0.1–0.9)
Monocytes: 7 %
NEUTROS PCT: 56 %
Neutrophils Absolute: 2.8 10*3/uL (ref 1.4–7.0)
RBC: 4.17 x10E6/uL (ref 3.77–5.28)
RDW: 12.9 % (ref 12.3–15.4)
WBC: 5 10*3/uL (ref 3.4–10.8)

## 2013-07-18 LAB — COMPREHENSIVE METABOLIC PANEL
ALK PHOS: 78 IU/L (ref 39–117)
ALT: 12 IU/L (ref 0–32)
AST: 15 IU/L (ref 0–40)
Albumin/Globulin Ratio: 1.6 (ref 1.1–2.5)
Albumin: 4.3 g/dL (ref 3.5–5.5)
BUN/Creatinine Ratio: 9 (ref 9–23)
BUN: 7 mg/dL (ref 6–24)
CALCIUM: 9.7 mg/dL (ref 8.7–10.2)
CHLORIDE: 104 mmol/L (ref 96–108)
CO2: 30 mmol/L — AB (ref 18–29)
Creatinine, Ser: 0.81 mg/dL (ref 0.57–1.00)
GFR calc Af Amer: 100 mL/min/{1.73_m2} (ref >59)
GFR calc non Af Amer: 87 mL/min/{1.73_m2} (ref >59)
GLOBULIN, TOTAL: 2.7 g/dL (ref 1.5–4.5)
GLUCOSE: 86 mg/dL (ref 65–99)
Potassium: 4.5 mmol/L (ref 3.5–5.2)
Sodium: 142 mmol/L (ref 134–144)
TOTAL PROTEIN: 7 g/dL (ref 6.0–8.5)
Total Bilirubin: 1.3 mg/dL — ABNORMAL HIGH (ref 0.0–1.2)

## 2013-07-18 MED ORDER — DIVALPROEX SODIUM 250 MG PO DR TAB: 250.0000 mg | DELAYED_RELEASE_TABLET | Freq: Every day | ORAL | Status: DC

## 2013-07-18 NOTE — Progress Notes (Signed)
[facility]  PATIENT: [name] DOB: July 26, 1965   REASON FOR VISIT: headaches, vertigo   HISTORY OF PRESENT ILLNESS: Ms. [name], 48 year old female returns for followup. 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 She also has a history of headaches refractory migraine but have improved after Depakote was started. Her headaches are triggered by stress and anxiety. She is not aware of other triggers on return visit today she states her last headache was several weeks ago and they are mild at this point she denies any dizziness at present. She stopped her Depakote several months ago but resumed after her headaches returned. She has congenital deafness and has an interpreter with her today. She returns for reevaluation.   REVIEW OF SYSTEMS: Full 14 system review of systems performed and notable only for those listed, all others are neg:  Constitutional: N/A  Cardiovascular: N/A  Ear/Nose/Throat: DEAF Skin: N/A  Eyes: N/A  Respiratory: N/A  Gastroitestinal: N/A  Hematology/Lymphatic: N/A  Endocrine: N/A Musculoskeletal:N/A  Allergy/Immunology: N/A  Neurological: Occasional dizziness, occasional headache Psychiatric: Anxiety  Sleep : Insomnia  ALLERGIES: Allergies  Allergen Reactions  . Diphenhydramine Hcl     REACTION: itchy, rash  . Promethazine Hcl     REACTION: itchy and red    HOME MEDICATIONS: Outpatient Prescriptions Prior to Visit  Medication Sig Dispense Refill  . divalproex (DEPAKOTE) 250 MG DR tablet TAKE 1 TABLET (250 MG TOTAL) BY MOUTH DAILY.  30 tablet  0  . Menthol-Methyl Salicylate (BENGAY ARTHRITIS FORMULA EX) Apply topically as needed.       No facility-administered medications prior to visit.    PAST MEDICAL HISTORY: Past Medical History    Diagnosis Date  . Congenital deafness   . CIN I (cervical intraepithelial neoplasia I) 2003    by colpo  . S/P bilateral oophorectomy   . Deaf   . Cancer 1999    breast-s/p mastectomy, chemo, rad  . Breast cancer   . Abnormal Pap smear     PAST SURGICAL HISTORY: Past Surgical History  Procedure Laterality Date  . Mastectomy    . Ovarian cyst removal    . Mastectomy    . Tubal ligation      FAMILY HISTORY: Family History  Problem Relation Age of Onset  . Hypertension Mother   . Seizures Mother     SOCIAL HISTORY: History   Social History  . Marital Status: Married    Spouse Name: N/A    Number of Children: N/A  . Years of Education: N/A   Occupational History  . Not on file.   Social History Main Topics  . Smoking status: Never Smoker   . Smokeless tobacco: Never Used  . Alcohol Use: No  . Drug Use: No  . Sexual Activity: Yes    Birth Control/ Protection: Surgical   Other Topics Concern  . Not on file   Social History Narrative  . No narrative on file     PHYSICAL EXAM  Filed Vitals:   07/18/13 1420  BP: 115/71  Pulse: 64  Height: 5\' 5"  (1.651 m)  Weight: 137 lb (62.143 kg)   Body mass index is 22.8 kg/(m^2). General: well developed, well nourished, seated, in no evident distress  Head: head normocephalic and atraumatic. Oropharynx benign  Neck: supple with no carotid bruits  Cardiovascular: regular rate and rhythm, no  murmurs  Neurologic Exam  Mental Status: Awake and fully alert. Oriented to place and time. Follows all commands. Speech and language cannot be tested, patient is deaf and mute.  Cranial Nerves: Fundoscopic exam reveals sharp disc margins. Pupils equal, briskly reactive to light. Extraocular movements full without nystagmus. Visual fields full to confrontation. Pt is deaf. Face, tongue, palate move normally and symmetrically. Neck flexion and extension normal.  Motor: Normal bulk and tone. Normal strength in all tested extremity  muscles.No focal weakness  Sensory.: intact to touch and pinprick and vibratory.  Coordination: Rapid alternating movements normal in all extremities. Finger-to-nose and heel-to-shin performed accurately bilaterally.  Gait and Station: Arises from chair without difficulty. Stance is normal. Able to heel, toe and tandem walk without difficulty.  Reflexes: 2+ and symmetric. Toes downgoing.    DIAGNOSTIC DATA (LABS, IMAGING, TESTING) - I reviewed patient records, labs, notes, testing and imaging myself where available.  Lab Results  Component Value Date   WBC 4.4 05/31/2012   HGB 12.1 05/31/2012   HCT 34.4* 05/31/2012   MCV 86.7 05/31/2012   PLT 186 05/31/2012      Component Value Date/Time   NA 140 06/22/2012 1435   NA 142 05/31/2012 1055   NA 141 06/17/2011 1942   K 4.0 06/22/2012 1435   K 3.4* 05/31/2012 1055   CL 103 06/22/2012 1435   CL 106 05/31/2012 1055   CO2 30* 06/22/2012 1435   CO2 28 05/31/2012 1055   GLUCOSE 82 06/22/2012 1435   GLUCOSE 88 05/31/2012 1055   GLUCOSE 105* 06/17/2011 1942   BUN 12 06/22/2012 1435   BUN 6.6* 05/31/2012 1055   BUN 8 06/17/2011 1942   CREATININE 0.85 06/22/2012 1435   CREATININE 0.8 05/31/2012 1055   CALCIUM 9.6 06/22/2012 1435   CALCIUM 8.8 05/31/2012 1055   PROT 6.7 06/22/2012 1435   PROT 6.7 05/31/2012 1055   PROT 7.5 06/08/2011 0025   ALBUMIN 3.6 05/31/2012 1055   ALBUMIN 4.1 06/08/2011 0025   AST 13 06/22/2012 1435   AST 13 05/31/2012 1055   ALT 10 06/22/2012 1435   ALT 11 05/31/2012 1055   ALKPHOS 76 06/22/2012 1435   ALKPHOS 75 05/31/2012 1055   BILITOT 1.7* 06/22/2012 1435   BILITOT 1.72* 05/31/2012 1055   GFRNONAA 82 06/22/2012 1435   GFRAA 95 06/22/2012 1435    ASSESSMENT AND PLAN  48 y.o. 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  Continue current dose of Depakote, will refill Check labs today, CBC, CMP Try OTC Melatonin for insomnia F/U  yearly Dennie Bible, [facility], [facility], APRN  [facility] [address] [place], [place] [address] [phone]

## 2013-07-18 NOTE — Patient Instructions (Signed)
Continue current dose of Depakote, will refill Check labs today Try OTC Melatonin for insomnia F/U yearly

## 2013-07-19 ENCOUNTER — Encounter: Payer: Self-pay | Admitting: *Deleted

## 2013-07-19 NOTE — Progress Notes (Signed)
Quick Note:  Mailed results to pt. ______

## 2013-07-23 NOTE — Progress Notes (Signed)
I agree with the above plan 

## 2013-09-25 ENCOUNTER — Encounter (HOSPITAL_COMMUNITY): Payer: Self-pay | Admitting: Emergency Medicine

## 2013-09-25 ENCOUNTER — Emergency Department (HOSPITAL_COMMUNITY)
Admission: EM | Admit: 2013-09-25 | Discharge: 2013-09-25 | Disposition: A | Discharge status: Home / Self Care | Payer: Medicaid Other | Attending: Emergency Medicine | Admitting: Emergency Medicine

## 2013-09-25 ENCOUNTER — Emergency Department (HOSPITAL_COMMUNITY): Payer: Medicaid Other

## 2013-09-25 DIAGNOSIS — Z8541 Personal history of malignant neoplasm of cervix uteri: Secondary | ICD-10-CM | POA: Diagnosis not present

## 2013-09-25 DIAGNOSIS — H9190 Unspecified hearing loss, unspecified ear: Secondary | ICD-10-CM | POA: Diagnosis not present

## 2013-09-25 DIAGNOSIS — R112 Nausea with vomiting, unspecified: Secondary | ICD-10-CM | POA: Diagnosis not present

## 2013-09-25 DIAGNOSIS — R197 Diarrhea, unspecified: Secondary | ICD-10-CM | POA: Diagnosis not present

## 2013-09-25 DIAGNOSIS — R143 Flatulence: Secondary | ICD-10-CM

## 2013-09-25 DIAGNOSIS — R1031 Right lower quadrant pain: Secondary | ICD-10-CM | POA: Insufficient documentation

## 2013-09-25 DIAGNOSIS — Z9889 Other specified postprocedural states: Secondary | ICD-10-CM | POA: Insufficient documentation

## 2013-09-25 DIAGNOSIS — Z79899 Other long term (current) drug therapy: Secondary | ICD-10-CM | POA: Diagnosis not present

## 2013-09-25 DIAGNOSIS — R630 Anorexia: Secondary | ICD-10-CM | POA: Insufficient documentation

## 2013-09-25 DIAGNOSIS — Z9851 Tubal ligation status: Secondary | ICD-10-CM | POA: Diagnosis not present

## 2013-09-25 DIAGNOSIS — Z9079 Acquired absence of other genital organ(s): Secondary | ICD-10-CM | POA: Diagnosis not present

## 2013-09-25 DIAGNOSIS — R6883 Chills (without fever): Secondary | ICD-10-CM | POA: Insufficient documentation

## 2013-09-25 DIAGNOSIS — Z853 Personal history of malignant neoplasm of breast: Secondary | ICD-10-CM | POA: Insufficient documentation

## 2013-09-25 DIAGNOSIS — R142 Eructation: Secondary | ICD-10-CM

## 2013-09-25 DIAGNOSIS — R141 Gas pain: Secondary | ICD-10-CM | POA: Diagnosis not present

## 2013-09-25 LAB — COMPREHENSIVE METABOLIC PANEL
ALBUMIN: 4 g/dL (ref 3.5–5.2)
ALK PHOS: 78 U/L (ref 39–117)
ALT: 13 U/L (ref 0–35)
ANION GAP: 11 (ref 5–15)
AST: 15 U/L (ref 0–37)
BUN: 10 mg/dL (ref 6–23)
CO2: 27 mEq/L (ref 19–32)
CREATININE: 0.8 mg/dL (ref 0.50–1.10)
Calcium: 9.7 mg/dL (ref 8.4–10.5)
Chloride: 103 mEq/L (ref 96–112)
GFR calc non Af Amer: 86 mL/min — ABNORMAL LOW (ref >90)
GLUCOSE: 90 mg/dL (ref 70–99)
POTASSIUM: 4.6 meq/L (ref 3.7–5.3)
Sodium: 141 mEq/L (ref 137–147)
TOTAL PROTEIN: 7.4 g/dL (ref 6.0–8.3)
Total Bilirubin: 1 mg/dL (ref 0.3–1.2)

## 2013-09-25 LAB — URINE MICROSCOPIC-ADD ON

## 2013-09-25 LAB — URINALYSIS, ROUTINE W REFLEX MICROSCOPIC
BILIRUBIN URINE: NEGATIVE
Glucose, UA: NEGATIVE mg/dL
Ketones, ur: NEGATIVE mg/dL
Leukocytes, UA: NEGATIVE
NITRITE: NEGATIVE
PROTEIN: NEGATIVE mg/dL
Specific Gravity, Urine: 1.021 (ref 1.005–1.030)
Urobilinogen, UA: 0.2 mg/dL (ref 0.0–1.0)
pH: 6 (ref 5.0–8.0)

## 2013-09-25 LAB — CBC WITH DIFFERENTIAL/PLATELET
Basophils Absolute: 0 10*3/uL (ref 0.0–0.1)
Basophils Relative: 0 % (ref 0–1)
EOS PCT: 3 % (ref 0–5)
Eosinophils Absolute: 0.1 10*3/uL (ref 0.0–0.7)
HEMATOCRIT: 36.7 % (ref 36.0–46.0)
HEMOGLOBIN: 12.7 g/dL (ref 12.0–15.0)
LYMPHS ABS: 1.4 10*3/uL (ref 0.7–4.0)
Lymphocytes Relative: 34 % (ref 12–46)
MCH: 30.5 pg (ref 26.0–34.0)
MCHC: 34.6 g/dL (ref 30.0–36.0)
MCV: 88 fL (ref 78.0–100.0)
MONOS PCT: 7 % (ref 3–12)
Monocytes Absolute: 0.3 10*3/uL (ref 0.1–1.0)
Neutro Abs: 2.4 10*3/uL (ref 1.7–7.7)
Neutrophils Relative %: 56 % (ref 43–77)
Platelets: 197 10*3/uL (ref 150–400)
RBC: 4.17 MIL/uL (ref 3.87–5.11)
RDW: 12.4 % (ref 11.5–15.5)
WBC: 4.3 10*3/uL (ref 4.0–10.5)

## 2013-09-25 LAB — LIPASE, BLOOD: LIPASE: 20 U/L (ref 11–59)

## 2013-09-25 MED ORDER — IOHEXOL 300 MG/ML  SOLN
25.0000 mL | Freq: Once | INTRAMUSCULAR | Status: AC | PRN
  Administered 2013-09-25: 25 mL via ORAL

## 2013-09-25 MED ORDER — SODIUM CHLORIDE 0.9 % IV BOLUS (SEPSIS)
1000.0000 mL | Freq: Once | INTRAVENOUS | Status: AC
  Administered 2013-09-25: 1000 mL via INTRAVENOUS

## 2013-09-25 MED ORDER — KETOROLAC TROMETHAMINE 30 MG/ML IJ SOLN
30.0000 mg | Freq: Once | INTRAMUSCULAR | Status: AC
  Administered 2013-09-25: 30 mg via INTRAVENOUS
  Filled 2013-09-25: qty 1

## 2013-09-25 MED ORDER — HYDROMORPHONE HCL PF 1 MG/ML IJ SOLN: 0.5000 mg | Freq: Once | INTRAMUSCULAR | Status: DC

## 2013-09-25 MED ORDER — IOHEXOL 300 MG/ML  SOLN
80.0000 mL | Freq: Once | INTRAMUSCULAR | Status: AC | PRN
  Administered 2013-09-25: 80 mL via INTRAVENOUS

## 2013-09-25 MED ORDER — MORPHINE SULFATE 4 MG/ML IJ SOLN
4.0000 mg | INTRAMUSCULAR | Status: DC | PRN
  Administered 2013-09-25: 4 mg via INTRAVENOUS
  Filled 2013-09-25: qty 1

## 2013-09-25 NOTE — ED Notes (Signed)
Patient transported to CT 

## 2013-09-25 NOTE — ED Provider Notes (Signed)
Pt with hx of lower abd surgery, with breast surgery secondary to breast cancer, bilateral oophorectomy. She has had abdominal pain for approximately one week located in the right lower quadrant and on exam has significant tenderness and guarding. There are no other peritoneal signs and no other abdominal tenderness either to the left lower quadrant or the upper abdomen. Bowel sounds are normal, labs are unremarkable, CT pending to rule out appendicitis or complication or intra-abdominal pathology. Morphine ordered for pain.  I saw and evaluated the patient, reviewed the resident's note and I agree with the findings and plan.  Final diagnoses:  RLQ abdominal pain       Johnna Acosta, MD 09/25/13 2333

## 2013-09-25 NOTE — ED Provider Notes (Signed)
CSN: [id number]     Arrival date & time 09/25/13  1433 History   First MD Initiated Contact with Patient 09/25/13 2028     Chief Complaint  Patient presents with  . Abdominal Pain    Patient is a 48 y.o. female presenting with abdominal pain. The history is provided by the patient. The history is limited by a language barrier. A language interpreter was used (deaf).  Abdominal Pain Pain location:  RLQ Pain quality: throbbing   Pain radiates to:  LLQ and suprapubic region Pain severity now: 7/10. Onset quality:  Gradual Duration:  1 week Timing:  Intermittent Progression:  Waxing and waning Chronicity:  New Context: previous surgery   Context: not alcohol use, not eating, not sick contacts, not suspicious food intake and not trauma   Relieved by:  Nothing Exacerbated by: bowel movement. Ineffective treatments:  NSAIDs Associated symptoms: anorexia, chills, diarrhea (green), flatus, nausea and vomiting (x1)   Associated symptoms: no chest pain, no constipation, no cough, no dysuria, no fever, no hematemesis, no hematochezia, no hematuria, no melena, no shortness of breath, no vaginal bleeding and no vaginal discharge   Risk factors: multiple surgeries    B/l oophorectomy  Past Medical History  Diagnosis Date  . Congenital deafness   . CIN I (cervical intraepithelial neoplasia I) 2003    by colpo  . S/P bilateral oophorectomy   . Deaf   . Cancer 1999    breast-s/p mastectomy, chemo, rad  . Breast cancer   . Abnormal Pap smear    Past Surgical History  Procedure Laterality Date  . Mastectomy    . Ovarian cyst removal    . Mastectomy    . Tubal ligation     Family History  Problem Relation Age of Onset  . Hypertension Mother   . Seizures Mother    History  Substance Use Topics  . Smoking status: Never Smoker   . Smokeless tobacco: Never Used  . Alcohol Use: No   OB History   Grav Para Term Preterm Abortions TAB SAB Ect Mult Living   2 2 1 1  0 0 0 0 0 2      Review of Systems  Constitutional: Positive for chills. Negative for fever.  Respiratory: Negative for cough, shortness of breath and wheezing.   Cardiovascular: Negative for chest pain.  Gastrointestinal: Positive for nausea, vomiting (x1), abdominal pain, diarrhea (green), anorexia and flatus. Negative for constipation, melena, hematochezia and hematemesis.  Genitourinary: Negative for dysuria, hematuria, flank pain, vaginal bleeding and vaginal discharge.  Musculoskeletal: Negative for back pain.  Skin: Negative for rash.  All other systems reviewed and are negative.   Allergies  Diphenhydramine hcl and Promethazine hcl  Home Medications   Prior to Admission medications   Medication Sig Start Date End Date Taking? Authorizing Provider  divalproex (DEPAKOTE) 250 MG DR tablet Take 1 tablet (250 mg total) by mouth daily. 07/18/13   Dennie Bible, NP  Menthol-Methyl Salicylate [facility] ARTHRITIS FORMULA EX) Apply topically as needed.    Historical Provider, MD   BP 103/71  Pulse 76  Temp(Src) 97.9 F (36.6 C) (Oral)  Resp 18  Ht 5\' 6"  (1.676 m)  Wt 125 lb (56.7 kg)  BMI 20.19 kg/m2  SpO2 96% Physical Exam  Nursing note and vitals reviewed. Constitutional: She is oriented to person, place, and time. She appears well-developed and well-nourished.  Comfortable at rest  HENT:  Head: Normocephalic and atraumatic.  Nose: Nose normal.  Eyes: Conjunctivae  are normal.  Neck: Normal range of motion. Neck supple. No tracheal deviation present.  Cardiovascular: Normal rate, regular rhythm and normal heart sounds.   No murmur heard. Pulmonary/Chest: Effort normal and breath sounds normal. No respiratory distress. She has no rales. She exhibits no tenderness.  Abdominal: Soft. Bowel sounds are normal. She exhibits no distension and no mass. There is tenderness. There is guarding (RLQ voluntary). There is no rebound.  Neg CVAT, Murphys, Psoas, heel tap. Obturator sign equivocal   Musculoskeletal: Normal range of motion. She exhibits no edema.  Neurological: She is alert and oriented to person, place, and time.  Skin: Skin is warm and dry. No rash noted.  Psychiatric: She has a normal mood and affect.    ED Course  Procedures (including critical care time) Labs Review Labs Reviewed  COMPREHENSIVE METABOLIC PANEL - Abnormal; Notable for the following:    GFR calc non Af Amer 86 (*)    All other components within normal limits  URINALYSIS, ROUTINE W REFLEX MICROSCOPIC - Abnormal; Notable for the following:    Hgb urine dipstick MODERATE (*)    All other components within normal limits  URINE MICROSCOPIC-ADD ON - Abnormal; Notable for the following:    Bacteria, UA FEW (*)    All other components within normal limits  CBC WITH DIFFERENTIAL  LIPASE, BLOOD    Imaging Review Ct Abdomen Pelvis W Contrast  09/25/2013   CLINICAL DATA:  Abdominal pain mid to right-sided.  EXAM: CT ABDOMEN AND PELVIS WITH CONTRAST  TECHNIQUE: Multidetector CT imaging of the abdomen and pelvis was performed using the standard protocol following bolus administration of intravenous contrast.  CONTRAST:  53mL OMNIPAQUE IOHEXOL 300 MG/ML  SOLN  COMPARISON:  09/28/2010  FINDINGS: Lung bases are unremarkable.  Abdominal images demonstrate evidence of a prior cholecystectomy. There is mild diffuse hepatic steatosis. There is a sub cm hypodensity over the left lobe of the liver unchanged and likely a cyst. The spleen, pancreas and adrenal glands are within normal. There is a sub cm hypodensity over the lower pole of the left kidney unchanged and likely a cyst but too small to characterize. There is no hydronephrosis or nephrolithiasis. Surgical changes over the abdominal wall. The appendix is normal. There is minimal diverticulosis of the colon.  Pelvic images demonstrate a normal uterus, bladder and rectum. There is no free fluid. The remaining bones soft tissues are unremarkable.  IMPRESSION: No  acute findings in the abdomen/pelvis.  Sub cm left renal hypodensity unchanged and likely a cyst, but too small to characterize. Sub cm left liver hypodensity unchanged and likely a cyst.  Minimal diverticulosis of the colon.   Electronically Signed   By: Marin Olp M.D.   On: 09/25/2013 22:06     EKG Interpretation None      MDM   Final diagnoses:  RLQ abdominal pain   RLQ pain for 1 week.  S/p b/l oopherectomy. No signs of systemic toxicity but voluntary guarding.  No urinary or vaginal symptoms. Exam without appy signs. UA neg for infx. Concern for inflammatory intrabdominal process. Ordered CT abd/ pelvis which was negative.  May have passed a kidney stone as UA had blood.  May be MSK in nature.  Pt is amenable to discharge home.  Treat with NSAIDs. Will see PCP in 2-3 days for re-check. Discussed return precautions.     Tammy Sours, MD 09/25/13 2242

## 2013-09-25 NOTE — ED Provider Notes (Signed)
I saw and evaluated the patient, reviewed the resident's note and I agree with the findings and plan.  Please see my separate note regarding my evaluation of the patient.  Clinical Impression:    Final diagnoses:  RLQ abdominal pain     Johnna Acosta, MD 09/25/13 2333

## 2013-09-25 NOTE — ED Notes (Signed)
Pt reports R sided abdominal pain x 1 week with nvd. sts she feels stressed and fatigued as well. Pt is deaf- sign language interpreter has been contacted.

## 2013-09-25 NOTE — Discharge Instructions (Signed)

## 2013-09-25 NOTE — ED Notes (Signed)
Pty has never had toradol. Will keep pt 30-40 minutes to ensure no drug reaction before discharging

## 2013-09-29 ENCOUNTER — Ambulatory Visit (INDEPENDENT_AMBULATORY_CARE_PROVIDER_SITE_OTHER): Payer: Medicaid Other | Admitting: Family Medicine

## 2013-09-29 VITALS — BP 119/75 | HR 82 | Temp 97.9°F | Wt 135.4 lb

## 2013-09-29 DIAGNOSIS — R300 Dysuria: Secondary | ICD-10-CM

## 2013-09-29 DIAGNOSIS — R1031 Right lower quadrant pain: Secondary | ICD-10-CM

## 2013-09-29 LAB — POCT UA - MICROSCOPIC ONLY

## 2013-09-29 LAB — POCT URINALYSIS DIPSTICK
BILIRUBIN UA: NEGATIVE
GLUCOSE UA: NEGATIVE
KETONES UA: NEGATIVE
Leukocytes, UA: NEGATIVE
NITRITE UA: NEGATIVE
Spec Grav, UA: >=1.03
Urobilinogen, UA: 0.2
pH, UA: 6

## 2013-09-29 MED ORDER — SULFAMETHOXAZOLE-TMP DS 800-160 MG PO TABS: 1.0000 | ORAL_TABLET | Freq: Two times a day (BID) | ORAL | Status: DC

## 2013-09-29 MED ORDER — DICLOFENAC SODIUM 50 MG PO TBEC: 50.0000 mg | DELAYED_RELEASE_TABLET | Freq: Two times a day (BID) | ORAL | Status: DC

## 2013-09-29 NOTE — Progress Notes (Signed)
    Subjective   [name] is a 48 y.o. female that presents for a same day visit  1. RLQ pain: Pain started two weeks ago. Worsened. Went to the emergency room 4 days ago. Dysuria, frequency, urgency. Hurts when defecating. Radiates to back. Painful when sleeping. Pain is constant with intermittent times of increasing. Throbbing. No fevers. Vomited once 4 days ago with streaks of blood with another episode in ED that was clear. Normal bowel movements. She is in a monogamous relationship without recent history of STIs. No vaginal discharge or bleeding. History of c-section and oophorectomy. History of gallstones with surgery and subsequent cholecystectomy. CT scan from ED visit without obvious etiology for pain.  History  Substance Use Topics  . Smoking status: Never Smoker   . Smokeless tobacco: Never Used  . Alcohol Use: No    ROS Per HPI  Objective   BP 119/75  Pulse 82  Temp(Src) 97.9 F (36.6 C) (Oral)  Wt 135 lb 6.4 oz (61.417 kg)  General: Well appearing Gastrointestinal: Tenderness worse at RLQ and RUQ. Some suprapubic tenderness. Minimal guarding and without rebound. GU: Patient decline pelvic exam  Assessment and Plan   Please refer to problem based charting of assessment and plan

## 2013-09-29 NOTE — Patient Instructions (Signed)
Thank you for coming to see me today. It was a pleasure. Today we talked about:   Abdominal pain: I am unsure of what is causing your pain. From your recent emergency department, it is most likely not appendicitis. I will give you some pain medication and treat a possible urinary tract infection. If your pain worsens, if you develop nausea, vomiting, fevers, please go to the Emergency Department.  Please make an appointment to be seen next week. Also, come next week for urine collection.  If you have any questions or concerns, please do not hesitate to call the office at [phone].  Sincerely,  Cordelia Poche, MD

## 2013-09-30 DIAGNOSIS — R1031 Right lower quadrant pain: Secondary | ICD-10-CM | POA: Insufficient documentation

## 2013-09-30 DIAGNOSIS — R300 Dysuria: Secondary | ICD-10-CM | POA: Insufficient documentation

## 2013-09-30 LAB — URINE CULTURE: Colony Count: 2000

## 2013-09-30 NOTE — Assessment & Plan Note (Signed)
UA not very significant, however with symptoms, will treat with Bactrim. Urine culture sent and will follow-up results.

## 2013-09-30 NOTE — Assessment & Plan Note (Addendum)
Patient with UTI symptoms. Do not suspect pyelonephritis. Patient with cholecystectomy and bilateral oophorectomy. CT scan 4 days prior to presentation did not show appendicitis (after patient has had 2 weeks of symptoms). No significant epigastric tenderness to suggest gastritis and no symptoms of GI bleed. Previous CBC wnl and patient afebrile with minimal nausea/emesis. Patient declined Toradol in clinic. Also declined pelvic exam. GC/gonorrhea ordered as future to investigate possible PID although low suspicion. Will prescribe diclofenac for pain. Follow-up next week if symptoms persist. Patient may need interval CT scan as pain is appears significant. Red flags for prompt presentation to the ED reviewed with patient.

## 2013-11-13 ENCOUNTER — Encounter (HOSPITAL_COMMUNITY): Payer: Self-pay | Admitting: Emergency Medicine

## 2014-07-19 ENCOUNTER — Ambulatory Visit: Payer: Medicaid Other | Admitting: Nurse Practitioner

## 2014-07-24 ENCOUNTER — Ambulatory Visit (INDEPENDENT_AMBULATORY_CARE_PROVIDER_SITE_OTHER): Payer: Medicaid Other | Admitting: Nurse Practitioner

## 2014-07-24 ENCOUNTER — Encounter: Payer: Self-pay | Admitting: Nurse Practitioner

## 2014-07-24 VITALS — BP 118/73 | HR 77 | Ht 65.5 in | Wt 135.5 lb

## 2014-07-24 DIAGNOSIS — G43909 Migraine, unspecified, not intractable, without status migrainosus: Secondary | ICD-10-CM | POA: Diagnosis not present

## 2014-07-24 DIAGNOSIS — H8110 Benign paroxysmal vertigo, unspecified ear: Secondary | ICD-10-CM

## 2014-07-24 DIAGNOSIS — H9193 Unspecified hearing loss, bilateral: Secondary | ICD-10-CM

## 2014-07-24 DIAGNOSIS — Z5181 Encounter for therapeutic drug level monitoring: Secondary | ICD-10-CM | POA: Diagnosis not present

## 2014-07-24 MED ORDER — DIVALPROEX SODIUM 250 MG PO DR TAB: 500.0000 mg | DELAYED_RELEASE_TABLET | Freq: Every day | ORAL | Status: DC

## 2014-07-24 NOTE — Patient Instructions (Signed)
Increase Depakote to 2 capsules at night will refill Will check labs today Follow-up yearly and when necessary

## 2014-07-24 NOTE — Progress Notes (Signed)
[facility]  PATIENT: [name] DOB: Mar 04, 1965   REASON FOR VISIT: Follow-up for history of headaches and vertigo HISTORY FROM: Interpreter    HISTORY OF PRESENT ILLNESS:[name], 49 year old female returns for followup. 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 She also has a history of headaches refractory migraine but have improved after Depakote was started. Her headaches are triggered by stress and anxiety. She has had more headaches recently .She is not aware of other triggers on return visit today. She denies any recent vertigo .She is on Depakote for her headaches tried tapering at one time with resumption at the headache. She has congenital deafness and has an interpreter with her today. She returns for reevaluation.    REVIEW OF SYSTEMS: Full 14 system review of systems performed and notable only for those listed, all others are neg:  Constitutional: neg  Cardiovascular: neg Ear/Nose/Throat: neg  Skin: neg Eyes: neg Respiratory: neg Gastroitestinal: neg  Hematology/Lymphatic: neg  Endocrine: neg Musculoskeletal: Muscle cramps  Allergy/Immunology: neg Neurological: Headache, rare dizziness Psychiatric: Depression and anxiety Sleep : neg   ALLERGIES: Allergies  Allergen Reactions  . Diphenhydramine Hcl     REACTION: itchy, rash  . Promethazine Hcl     REACTION: itchy and red    HOME MEDICATIONS: Outpatient Prescriptions Prior to Visit  Medication Sig Dispense Refill  . diclofenac (VOLTAREN) 50 MG EC tablet Take 1 tablet (50 mg total) by mouth 2 (two) times daily. 20 tablet 0  . divalproex (DEPAKOTE) 250 MG DR tablet Take 250 mg by mouth at bedtime.    . naproxen sodium (ANAPROX) 220 MG tablet Take 440 mg by mouth daily as needed (pain).    Marland Kitchen  sulfamethoxazole-trimethoprim (BACTRIM DS) 800-160 MG per tablet Take 1 tablet by mouth 2 (two) times daily. 10 tablet 0   No facility-administered medications prior to visit.    PAST MEDICAL HISTORY: Past Medical History  Diagnosis Date  . Congenital deafness   . CIN I (cervical intraepithelial neoplasia I) 2003    by colpo  . S/P bilateral oophorectomy   . Deaf   . Cancer 1999    breast-s/p mastectomy, chemo, rad  . Breast cancer   . Abnormal Pap smear     PAST SURGICAL HISTORY: Past Surgical History  Procedure Laterality Date  . Mastectomy    . Ovarian cyst removal    . Mastectomy    . Tubal ligation      FAMILY HISTORY: Family History  Problem Relation Age of Onset  . Hypertension Mother   . Seizures Mother     SOCIAL HISTORY: History   Social History  . Marital Status: Married    Spouse Name: N/A  . Number of Children: N/A  . Years of Education: N/A   Occupational History  . Not on file.   Social History Main Topics  . Smoking status: Never Smoker   . Smokeless tobacco: Never Used  . Alcohol Use: No  . Drug Use: No  . Sexual Activity: Yes    Birth Control/ Protection: Surgical   Other Topics Concern  . Not on file   Social History Narrative     PHYSICAL EXAM  Filed Vitals:   07/24/14 1537  Height: 5' 5.5" (1.664 m)  Weight: 135 lb 8 oz (61.462 kg)   Body mass index is 22.2 kg/(m^2). General:  well developed, well nourished, seated, in no evident distress  Head: head normocephalic and atraumatic. Oropharynx benign  Neck: supple with no carotid bruits  Cardiovascular: regular rate and rhythm, no murmurs  Neurologic Exam  Mental Status: Awake and fully alert. Oriented to place and time. Follows all commands. Speech and language cannot be tested, patient is deaf and mute.  Cranial Nerves: Pupils equal, briskly reactive to light. Extraocular movements full without nystagmus. Visual fields full to confrontation. Pt is deaf. Face, tongue,  palate move normally and symmetrically. Neck flexion and extension normal.  Motor: Normal bulk and tone. Normal strength in all tested extremity muscles.No focal weakness  Sensory.: intact to touch and pinprick and vibratory.  Coordination: Rapid alternating movements normal in all extremities. Finger-to-nose and heel-to-shin performed accurately bilaterally.  Gait and Station: Arises from chair without difficulty. Stance is normal. Able to heel, toe and tandem walk without difficulty.  Reflexes: 2+ and symmetric. Toes downgoing.   DIAGNOSTIC DATA (LABS, IMAGING, TESTING) - ASSESSMENT AND PLAN  49 y.o. year old female  has a past medical history of Congenital deafness;  Deaf; migraine headaches and vertigo.The patient is a current patient of Dr. Leonie Man  who is out of the office today . This note is sent to the work in doctor.     Increase Depakote to 2 capsules at night will refill Will check labs today Follow-up yearly and when necessary [name], [facility], [facility], APRN  [facility] [address] [place], [place] [address] [phone]

## 2014-07-24 NOTE — Progress Notes (Signed)
I reviewed above note and agree with the assessment and plan.  Rosalin Hawking, MD PhD Stroke Neurology 07/24/2014 4:52 PM

## 2014-07-25 LAB — CBC WITH DIFFERENTIAL/PLATELET
BASOS ABS: 0 10*3/uL (ref 0.0–0.2)
BASOS: 0 %
EOS (ABSOLUTE): 0.1 10*3/uL (ref 0.0–0.4)
Eos: 2 %
Hematocrit: 36.7 % (ref 34.0–46.6)
Hemoglobin: 12.5 g/dL (ref 11.1–15.9)
Immature Grans (Abs): 0 10*3/uL (ref 0.0–0.1)
Immature Granulocytes: 0 %
LYMPHS: 33 %
Lymphocytes Absolute: 1.6 10*3/uL (ref 0.7–3.1)
MCH: 29.8 pg (ref 26.6–33.0)
MCHC: 34.1 g/dL (ref 31.5–35.7)
MCV: 88 fL (ref 79–97)
Monocytes Absolute: 0.3 10*3/uL (ref 0.1–0.9)
Monocytes: 6 %
NEUTROS ABS: 2.9 10*3/uL (ref 1.4–7.0)
Neutrophils: 59 %
Platelets: 217 10*3/uL (ref 150–379)
RBC: 4.19 x10E6/uL (ref 3.77–5.28)
RDW: 13.3 % (ref 12.3–15.4)
WBC: 4.9 10*3/uL (ref 3.4–10.8)

## 2014-07-25 LAB — COMPREHENSIVE METABOLIC PANEL
ALBUMIN: 4.4 g/dL (ref 3.5–5.5)
ALK PHOS: 79 IU/L (ref 39–117)
ALT: 14 IU/L (ref 0–32)
AST: 16 IU/L (ref 0–40)
Albumin/Globulin Ratio: 1.8 (ref 1.1–2.5)
BILIRUBIN TOTAL: 1.3 mg/dL — AB (ref 0.0–1.2)
BUN / CREAT RATIO: 10 (ref 9–23)
BUN: 9 mg/dL (ref 6–24)
CHLORIDE: 101 mmol/L (ref 97–108)
CO2: 24 mmol/L (ref 18–29)
Calcium: 9.2 mg/dL (ref 8.7–10.2)
Creatinine, Ser: 0.9 mg/dL (ref 0.57–1.00)
GFR, EST AFRICAN AMERICAN: 87 mL/min/{1.73_m2} (ref >59)
GFR, EST NON AFRICAN AMERICAN: 76 mL/min/{1.73_m2} (ref >59)
GLOBULIN, TOTAL: 2.4 g/dL (ref 1.5–4.5)
Glucose: 83 mg/dL (ref 65–99)
POTASSIUM: 4 mmol/L (ref 3.5–5.2)
SODIUM: 140 mmol/L (ref 134–144)
Total Protein: 6.8 g/dL (ref 6.0–8.5)

## 2014-07-26 ENCOUNTER — Encounter: Payer: Self-pay | Admitting: *Deleted

## 2014-07-26 NOTE — Progress Notes (Signed)
Quick Note:  Sent letter with normal results to pt. (she is deaf). ______

## 2014-07-30 ENCOUNTER — Other Ambulatory Visit: Payer: Self-pay | Admitting: Nurse Practitioner

## 2014-07-30 NOTE — Telephone Encounter (Signed)
Last OV note says: Increase Depakote to 2 capsules at night will refill

## 2015-01-13 DIAGNOSIS — Z95828 Presence of other vascular implants and grafts: Secondary | ICD-10-CM

## 2015-01-13 HISTORY — DX: Presence of other vascular implants and grafts: Z95.828

## 2015-01-25 ENCOUNTER — Telehealth: Payer: Self-pay | Admitting: Oncology

## 2015-01-25 NOTE — Telephone Encounter (Signed)
Returned call with an appointment per patient request/voicemail

## 2015-02-24 ENCOUNTER — Other Ambulatory Visit: Payer: Self-pay | Admitting: Oncology

## 2015-02-24 DIAGNOSIS — Z853 Personal history of malignant neoplasm of breast: Secondary | ICD-10-CM

## 2015-02-25 ENCOUNTER — Other Ambulatory Visit: Payer: Self-pay | Admitting: Oncology

## 2015-02-25 ENCOUNTER — Ambulatory Visit (HOSPITAL_BASED_OUTPATIENT_CLINIC_OR_DEPARTMENT_OTHER): Payer: Medicaid Other | Admitting: Oncology

## 2015-02-25 ENCOUNTER — Encounter: Payer: Self-pay | Admitting: Oncology

## 2015-02-25 ENCOUNTER — Telehealth: Payer: Self-pay | Admitting: Oncology

## 2015-02-25 VITALS — BP 118/62 | HR 65 | Resp 18 | Ht 65.5 in | Wt 140.7 lb

## 2015-02-25 DIAGNOSIS — Z9079 Acquired absence of other genital organ(s): Secondary | ICD-10-CM

## 2015-02-25 DIAGNOSIS — H905 Unspecified sensorineural hearing loss: Secondary | ICD-10-CM

## 2015-02-25 DIAGNOSIS — Z91199 Patient's noncompliance with other medical treatment and regimen due to unspecified reason: Secondary | ICD-10-CM

## 2015-02-25 DIAGNOSIS — E8940 Asymptomatic postprocedural ovarian failure: Secondary | ICD-10-CM

## 2015-02-25 DIAGNOSIS — Z1231 Encounter for screening mammogram for malignant neoplasm of breast: Secondary | ICD-10-CM

## 2015-02-25 DIAGNOSIS — Z853 Personal history of malignant neoplasm of breast: Secondary | ICD-10-CM | POA: Diagnosis not present

## 2015-02-25 DIAGNOSIS — Z90722 Acquired absence of ovaries, bilateral: Secondary | ICD-10-CM

## 2015-02-25 DIAGNOSIS — Z9111 Patient's noncompliance with dietary regimen: Secondary | ICD-10-CM

## 2015-02-25 NOTE — Progress Notes (Signed)
OFFICE PROGRESS NOTE   February 25, 2015   Physicians: [facility]), C.Harraway Janelle Floor (neurology)  INTERVAL HISTORY:  Patient is seen, together with sign language interpreter and husband, for the first time back at this office since 05-2012, with history of node + right breast cancer at age 50 in 78-1999. She has had no known disease since she completed therapy 2005, including oophorectomy 2005.  Last left mammogram was 04-2012 (right mastectomy). Last PAP in this EMR 10-2012; she is scheduled to see Dr Ihor Dow on 02-28-15. Patient tells me that she no longer follows with [place] for PCP  Patient is concerned about a tender area in lateral left breast which she noticed recently. She denies other changes in reconstructed right breast or left breast. She has positional vertigo, evaluated including with MRI 2015 by Dr Erlinda Hong of neurology, still bothersome with certain positions including bending forward and lying on stomach (as would need to do for breast MRI). Depakote has been helpful for HA. She has had difficulty sleeping, which she relates to meds from neurology. She denies recent infectious illness, SOB or other respiratory problems, any other pain, swelling LE, any bleeding. Appetite is good, bowels unchanged, energy good. Some right knee pain.  Remainder of 10 point Review of Systems negative.   No PAC Refused flu vaccine today Has not had genetics counseling. Discussed and she is in agreement with referral to genetics.    ONCOLOGIC HISTORY Patient had 1.4 cm poorly differentiated right breast carcinoma diagnosed in Nov 1999 at age 38, 1/7 axillary nodes involved, ER/PR and HER 2 positive. She had mastectomy with the 7 axillary node evaluation, 4 cycles of adriamycin/ cytoxan followed by taxotere, then five years of tamoxifen thru June 2005 (no herceptin in 1999). She had right TRAM reconstruction. She had bilateral salpingoophorectomy in May 2005. She  was briefly on aromatase inhibitor after tamoxifen, but discontinued this herself due to poor tolerance. Most recent left mammogram was done as screening on 04-15-2012 with heterogeneously dense tissue and questioned asymmetry, followed by diagnostic left mamogram on 04-28-12 which had no findings of concern.   Objective:  Vital signs in last 24 hours:  BP 118/62 mmHg  Pulse 65  Resp 18  Ht 5' 5.5" (1.664 m)  Wt 140 lb 11.2 oz (63.821 kg)  BMI 23.05 kg/m2  SpO2 100% Weight up 8 lbs from 05-2012.Alert, oriented and appropriate. Ambulatory without difficulty.  Looks comfortable, very pleasant. Communicates easily with assistance of sign language interpreter. HEENT:PERRL, sclerae not icteric. Oral mucosa moist without lesions, posterior pharynx clear.  Neck supple. No JVD.  Lymphatics:no cervical,supraclavicular, axillary or inguinal adenopathy Resp: clear to auscultation bilaterally and normal percussion bilaterally Cardio: regular rate and rhythm. No gallop. GI: soft, nontender, not distended, no mass or organomegaly. Normally active bowel sounds.Well healed surgical incisions Musculoskeletal/ Extremities: UE, LE without pitting edema, cords, tenderness. Spine not tender Neuro: deafness, otherwise nonfocal. PSYCH appropriate mood and affect Skin without rash, ecchymosis, petechiae Breasts: right breast tram reconstruction without evidence of local recurrence. Left breast without dominant mass, skin or nipple findings. Slight localized tenderness ~ 2:00. Axillae benign.  Lab Results:  Results for orders placed or performed in visit on 07/24/14  Comprehensive metabolic panel  Result Value Ref Range   Glucose 83 65 - 99 mg/dL   BUN 9 6 - 24 mg/dL   Creatinine, Ser 0.90 0.57 - 1.00 mg/dL   GFR calc non Af Amer 76 >59 mL/min/1.73   GFR  calc Af Amer 87 >59 mL/min/1.73   BUN/Creatinine Ratio 10 9 - 23   Sodium 140 134 - 144 mmol/L   Potassium 4.0 3.5 - 5.2 mmol/L   Chloride 101 97 - 108  mmol/L   CO2 24 18 - 29 mmol/L   Calcium 9.2 8.7 - 10.2 mg/dL   Total Protein 6.8 6.0 - 8.5 g/dL   Albumin 4.4 3.5 - 5.5 g/dL   Globulin, Total 2.4 1.5 - 4.5 g/dL   Albumin/Globulin Ratio 1.8 1.1 - 2.5   Bilirubin Total 1.3 (H) 0.0 - 1.2 mg/dL   Alkaline Phosphatase 79 39 - 117 IU/L   AST 16 0 - 40 IU/L   ALT 14 0 - 32 IU/L  CBC with Differential/Platelet  Result Value Ref Range   WBC 4.9 3.4 - 10.8 x10E3/uL   RBC 4.19 3.77 - 5.28 x10E6/uL   Hemoglobin 12.5 11.1 - 15.9 g/dL   Hematocrit 36.7 34.0 - 46.6 %   MCV 88 79 - 97 fL   MCH 29.8 26.6 - 33.0 pg   MCHC 34.1 31.5 - 35.7 g/dL   RDW 13.3 12.3 - 15.4 %   Platelets 217 150 - 379 x10E3/uL   Neutrophils 59 %   Lymphs 33 %   Monocytes 6 %   Eos 2 %   Basos 0 %   Neutrophils Absolute 2.9 1.4 - 7.0 x10E3/uL   Lymphocytes Absolute 1.6 0.7 - 3.1 x10E3/uL   Monocytes Absolute 0.3 0.1 - 0.9 x10E3/uL   EOS (ABSOLUTE) 0.1 0.0 - 0.4 x10E3/uL   Basophils Absolute 0.0 0.0 - 0.2 x10E3/uL   Immature Granulocytes 0 %   Immature Grans (Abs) 0.0 0.0 - 0.1 x10E3/uL     Studies/Results:  No results found.  Overdue mammograms, last 2014.  Left mammogram screening 3D requested at [facility] this week or next, tomo necessary due to dense breast tissue Never bone density scan, surgical menopause. Requested with mammogram at [facility]   Medications: I have reviewed the patient's current medications.  DISCUSSION Nothing of obvious concern on exam, however overdue mammogram, which will be useful also with her concern left breast.  With assistance of sign language interpreter have discussed imaging as above. I have also recommended genetics counseling due to premenopausal breast cancer - she does not know of any cancer in maternal relatives, but does not have any information about biologic father or his family.   She agrees with mammogram, bone density scan and genetics referral. She will keep appointment with Dr Ihor Dow 02-28-15  as scheduled. I have encouraged her to get a PCP also.  Assessment/Plan:  1.T1N1 premenopausal right breast carcinoma ER/PR +, HER 2 + diagnosed Nov 1999, post treatment as above and now on observation. Left mammogram and genetics counseling, then I will follow up to be sure no concerns; likely will do best with yearly visits at this office if possible, at least until clear that she is well established with PCP. 2.premature surgical menopause, post BSO 2005 for pelvic pain and ovarian cysts. Bone density scan ordered. Appreciate care by Dr Ihor Dow 3.congenital deafness 4.positional vertigo: followed by neurology. Note she does not tolerate lying on stomach due to vertigo, in case breast MRI considered 5.declined flu vaccine, discussed including use of tamiflu 6.post laparoscopic cholecystectomy 2004  All questions answered. CC Dr Ihor Dow for upcoming appointment. Time spent 25 min including >50% counseling and coordination of care. Sign language interpreter requested for imaging, genetics and MD appoitments upcoming  Destini Cambre P, MD   02/25/2015, 2:53 PM

## 2015-02-25 NOTE — Telephone Encounter (Signed)
Appointments made and avs printed °

## 2015-02-26 DIAGNOSIS — H905 Unspecified sensorineural hearing loss: Secondary | ICD-10-CM | POA: Insufficient documentation

## 2015-02-26 DIAGNOSIS — Z91199 Patient's noncompliance with other medical treatment and regimen due to unspecified reason: Secondary | ICD-10-CM | POA: Insufficient documentation

## 2015-02-26 DIAGNOSIS — Z90722 Acquired absence of ovaries, bilateral: Secondary | ICD-10-CM | POA: Insufficient documentation

## 2015-02-26 DIAGNOSIS — Z9079 Acquired absence of other genital organ(s): Secondary | ICD-10-CM

## 2015-02-26 DIAGNOSIS — E8940 Asymptomatic postprocedural ovarian failure: Secondary | ICD-10-CM | POA: Insufficient documentation

## 2015-02-26 DIAGNOSIS — Z9111 Patient's noncompliance with dietary regimen: Secondary | ICD-10-CM | POA: Insufficient documentation

## 2015-02-28 ENCOUNTER — Encounter: Payer: Self-pay | Admitting: Obstetrics & Gynecology

## 2015-02-28 ENCOUNTER — Ambulatory Visit (INDEPENDENT_AMBULATORY_CARE_PROVIDER_SITE_OTHER): Payer: Medicaid Other | Admitting: Obstetrics & Gynecology

## 2015-02-28 ENCOUNTER — Encounter: Payer: Self-pay | Admitting: Oncology

## 2015-02-28 ENCOUNTER — Other Ambulatory Visit (HOSPITAL_COMMUNITY)
Admission: RE | Admit: 2015-02-28 | Discharge: 2015-02-28 | Disposition: A | Discharge status: Home / Self Care | Payer: Medicaid Other | Source: Ambulatory Visit | Attending: Obstetrics & Gynecology | Admitting: Obstetrics & Gynecology

## 2015-02-28 VITALS — BP 121/70 | HR 66 | Temp 97.8°F | Ht 67.0 in

## 2015-02-28 DIAGNOSIS — Z113 Encounter for screening for infections with a predominantly sexual mode of transmission: Secondary | ICD-10-CM | POA: Insufficient documentation

## 2015-02-28 DIAGNOSIS — Z01419 Encounter for gynecological examination (general) (routine) without abnormal findings: Secondary | ICD-10-CM | POA: Diagnosis present

## 2015-02-28 DIAGNOSIS — Z202 Contact with and (suspected) exposure to infections with a predominantly sexual mode of transmission: Secondary | ICD-10-CM | POA: Diagnosis not present

## 2015-02-28 DIAGNOSIS — Z124 Encounter for screening for malignant neoplasm of cervix: Secondary | ICD-10-CM

## 2015-02-28 LAB — POCT URINALYSIS DIP (DEVICE)
Glucose, UA: NEGATIVE mg/dL
Ketones, ur: NEGATIVE mg/dL
Leukocytes, UA: NEGATIVE
Nitrite: NEGATIVE
Protein, ur: NEGATIVE mg/dL
Specific Gravity, Urine: >=1.03 (ref 1.005–1.030)
Urobilinogen, UA: 0.2 mg/dL (ref 0.0–1.0)
pH: 6 (ref 5.0–8.0)

## 2015-02-28 NOTE — Progress Notes (Signed)
Medical Oncology  Per Dr Lavera Guise, she is patient of Dr Leonie Man in neurology  L.Marko Plume, MD

## 2015-02-28 NOTE — Progress Notes (Signed)
Patient ID: [name], female   DOB: 06-17-1965, 50 y.o.   MRN: [id number] History:  50 y.o. G2P1102 here today for STI screen and annual PAP.  She denies current sx but, reports that her spouse is worried about her risk of STI's. She was recently seen by Dr. Marko Plume for breast exam and f/u for h/o breast ca. She has a mammogram scheduled for next week.    The following portions of the patient's history were reviewed and updated as appropriate: allergies, current medications, past family history, past medical history, past social history, past surgical history and problem list.  Review of Systems:  Pertinent items are noted in HPI.  Objective:  Physical Exam Blood pressure 121/70, pulse 66, temperature 97.8 F (36.6 C), temperature source Oral, height 5\' 7"  (1.702 m). Gen: NAD Abd: Soft, nontender and nondistended; well healed transverse incision. Pelvic: Normal appearing external genitalia; normal appearing vaginal mucosa and cervix.  Normal discharge.  Small uterus, no other palpable masses, no uterine or adnexal tenderness  Labs and Imaging No results found.  Assessment & Plan:  STI screen Annual PAP with Cx Labs: HIV, PRP, hepatitis screen F/u in 1 year or sooner prn  Karlis Cregg L. Harraway-Smith, M.D., Cherlynn June

## 2015-03-01 LAB — HEPATITIS PANEL, ACUTE
HCV AB: NEGATIVE
HEP B C IGM: NONREACTIVE
HEP B S AG: NEGATIVE
Hep A IgM: NONREACTIVE

## 2015-03-01 LAB — GC/CHLAMYDIA PROBE AMP (~~LOC~~) NOT AT ARMC
Chlamydia: NEGATIVE
Neisseria Gonorrhea: NEGATIVE

## 2015-03-01 LAB — RPR

## 2015-03-01 LAB — HIV ANTIBODY (ROUTINE TESTING W REFLEX): HIV 1&2 Ab, 4th Generation: NONREACTIVE

## 2015-03-06 ENCOUNTER — Ambulatory Visit
Admission: RE | Admit: 2015-03-06 | Discharge: 2015-03-06 | Disposition: A | Discharge status: Home / Self Care | Payer: Medicaid Other | Source: Ambulatory Visit | Attending: Oncology | Admitting: Oncology

## 2015-03-06 ENCOUNTER — Other Ambulatory Visit: Payer: Self-pay | Admitting: Oncology

## 2015-03-06 DIAGNOSIS — N63 Unspecified lump in unspecified breast: Secondary | ICD-10-CM

## 2015-03-06 DIAGNOSIS — Z1231 Encounter for screening mammogram for malignant neoplasm of breast: Secondary | ICD-10-CM

## 2015-03-07 ENCOUNTER — Telehealth: Payer: Self-pay

## 2015-03-07 NOTE — Telephone Encounter (Signed)
Faxed signed order dated 03-07-15 for mammogram and possible [place] of left breast.  Sent a copy to be scanned into HIM.

## 2015-03-12 ENCOUNTER — Other Ambulatory Visit: Payer: Self-pay | Admitting: Oncology

## 2015-03-14 ENCOUNTER — Other Ambulatory Visit: Payer: Self-pay | Admitting: Oncology

## 2015-03-14 ENCOUNTER — Ambulatory Visit
Admission: RE | Admit: 2015-03-14 | Discharge: 2015-03-14 | Disposition: A | Discharge status: Home / Self Care | Payer: Medicaid Other | Source: Ambulatory Visit | Attending: Oncology | Admitting: Oncology

## 2015-03-14 ENCOUNTER — Other Ambulatory Visit: Payer: Self-pay

## 2015-03-14 DIAGNOSIS — N63 Unspecified lump in unspecified breast: Secondary | ICD-10-CM

## 2015-03-14 DIAGNOSIS — E8940 Asymptomatic postprocedural ovarian failure: Secondary | ICD-10-CM

## 2015-03-14 DIAGNOSIS — Z1231 Encounter for screening mammogram for malignant neoplasm of breast: Secondary | ICD-10-CM

## 2015-03-25 ENCOUNTER — Encounter: Payer: Self-pay | Admitting: Oncology

## 2015-03-25 ENCOUNTER — Ambulatory Visit (HOSPITAL_BASED_OUTPATIENT_CLINIC_OR_DEPARTMENT_OTHER): Payer: Medicaid Other | Admitting: Genetic Counselor

## 2015-03-25 ENCOUNTER — Other Ambulatory Visit (HOSPITAL_BASED_OUTPATIENT_CLINIC_OR_DEPARTMENT_OTHER): Payer: Medicaid Other

## 2015-03-25 ENCOUNTER — Encounter: Payer: Self-pay | Admitting: Genetic Counselor

## 2015-03-25 DIAGNOSIS — Z315 Encounter for genetic counseling: Secondary | ICD-10-CM | POA: Diagnosis not present

## 2015-03-25 DIAGNOSIS — H9193 Unspecified hearing loss, bilateral: Secondary | ICD-10-CM

## 2015-03-25 DIAGNOSIS — Z853 Personal history of malignant neoplasm of breast: Secondary | ICD-10-CM

## 2015-03-25 LAB — CBC WITH DIFFERENTIAL/PLATELET
BASO%: 0.5 % (ref 0.0–2.0)
Basophils Absolute: 0 10*3/uL (ref 0.0–0.1)
EOS%: 2.9 % (ref 0.0–7.0)
Eosinophils Absolute: 0.2 10*3/uL (ref 0.0–0.5)
HEMATOCRIT: 35.9 % (ref 34.8–46.6)
HEMOGLOBIN: 12.2 g/dL (ref 11.6–15.9)
LYMPH#: 2.2 10*3/uL (ref 0.9–3.3)
LYMPH%: 38.1 % (ref 14.0–49.7)
MCH: 29.5 pg (ref 25.1–34.0)
MCHC: 34 g/dL (ref 31.5–36.0)
MCV: 86.7 fL (ref 79.5–101.0)
MONO#: 0.5 10*3/uL (ref 0.1–0.9)
MONO%: 9.3 % (ref 0.0–14.0)
NEUT%: 49.2 % (ref 38.4–76.8)
NEUTROS ABS: 2.9 10*3/uL (ref 1.5–6.5)
PLATELETS: 237 10*3/uL (ref 145–400)
RBC: 4.14 10*6/uL (ref 3.70–5.45)
RDW: 12.6 % (ref 11.2–14.5)
WBC: 5.9 10*3/uL (ref 3.9–10.3)

## 2015-03-25 LAB — COMPREHENSIVE METABOLIC PANEL
ALBUMIN: 3.7 g/dL (ref 3.5–5.0)
ALK PHOS: 82 U/L (ref 40–150)
ALT: 10 U/L (ref 0–55)
AST: 11 U/L (ref 5–34)
Anion Gap: 9 mEq/L (ref 3–11)
BILIRUBIN TOTAL: 1.06 mg/dL (ref 0.20–1.20)
BUN: 10.3 mg/dL (ref 7.0–26.0)
CALCIUM: 9.7 mg/dL (ref 8.4–10.4)
CO2: 28 mEq/L (ref 22–29)
CREATININE: 0.9 mg/dL (ref 0.6–1.1)
Chloride: 103 mEq/L (ref 98–109)
EGFR: 75 mL/min/{1.73_m2} — ABNORMAL LOW (ref >90)
Glucose: 111 mg/dl (ref 70–140)
Potassium: 3.5 mEq/L (ref 3.5–5.1)
Sodium: 141 mEq/L (ref 136–145)
TOTAL PROTEIN: 7.3 g/dL (ref 6.4–8.3)

## 2015-03-25 NOTE — Progress Notes (Signed)
No treatment as of today °

## 2015-03-25 NOTE — Progress Notes (Signed)
REFERRING PROVIDER: Patrecia Pour, MD [place], [place] [address]   Evlyn Clines, MD  PRIMARY PROVIDER:  Vance Gather, MD  PRIMARY REASON FOR VISIT:  1. Hx of breast cancer   2. Hearing loss, bilateral      HISTORY OF PRESENT ILLNESS:   [name], a 50 y.o. female, was seen for a [place] cancer genetics consultation at the request of Dr. Marko Plume due to a personal history of cancer.  [name] presents to clinic today, with her husband and a sign language interpreter, to discuss the possibility of a hereditary predisposition to cancer, genetic testing, and to further clarify her future cancer risks, as well as potential cancer risks for family members.   In 1999, at the age of 49, [name] was diagnosed with invasive ductal carcinoma of the right breast. The tumor was triple positive. This was treated with unilateral mastectomy, chemotherapy and tamoxifen for 5 years.  [name] is Deaf. She states that her deafness was identified when she was 3, and is thought to be due to the seizure medication that her mother took during her pregnancy.     CANCER HISTORY:   No history exists.     HORMONAL RISK FACTORS:  Menarche was at age 24-10.  First live birth at age 52.  OCP use for approximately 0 years.  Ovaries intact: no.  Hysterectomy: no.  Menopausal status: postmenopausal.  HRT use: 0 years. Colonoscopy: no; not examined. Mammogram within the last year: yes. Number of breast biopsies: 1. Up to date with pelvic exams:  yes. Any excessive radiation exposure in the past:  no  Past Medical History  Diagnosis Date  . Congenital deafness   . CIN I (cervical intraepithelial neoplasia I) 2003    by colpo  . S/P bilateral oophorectomy   . Deaf   . Cancer ([place]) 1999    breast-s/p mastectomy, chemo, rad  . Breast cancer ([name])   . Abnormal Pap smear     Past Surgical History  Procedure Laterality Date  . Mastectomy    . Ovarian cyst removal    .  Mastectomy    . Tubal ligation      Social History   Social History  . Marital Status: Married    Spouse Name: N/A  . Number of Children: 2  . Years of Education: N/A   Social History Main Topics  . Smoking status: Never Smoker   . Smokeless tobacco: Never Used  . Alcohol Use: No  . Drug Use: No  . Sexual Activity: Yes    Birth Control/ Protection: Surgical   Other Topics Concern  . None   Social History Narrative     FAMILY HISTORY:  We obtained a detailed, 4-generation family history.  Significant diagnoses are listed below: Family History  Problem Relation Age of Onset  . Hypertension Mother   . Seizures Mother   . Alzheimer's disease Maternal Uncle   . Pancreatitis Maternal Grandmother   . Heart attack Maternal Grandfather     The patient has two daughters who are cancer free.  She has a maternal 1/2 brother who is alive, and cancer free.  She has met her father only 1 time when she was 63 and does not know anything about him or his family.  Her mother is alive at 67.  She has seizures.  Her mother has one sister and two brothers.  One brother died of Alzheimer's disease.  The patient's maternal grandmother  died of an infection in her pancreas that spread to her kidneys.  Her grandfather died of a heart attack.  Patient's maternal ancestors are of Caucasian and Native American descent, and paternal ancestors are of unknown descent. There is no reported Ashkenazi Jewish ancestry. There is no known consanguinity.  GENETIC COUNSELING ASSESSMENT: [name] is a 50 y.o. female with a personal history of breast cancer which is somewhat suggestive of a hereditary cancer syndrome and predisposition to cancer. We, therefore, discussed and recommended the following at today's visit.   DISCUSSION: We discussed that about 5-10% of breast cancer is inherited, with the majority of cases due to BRCA mutations.  In women dx with breast cancer under 35, and are negative for BRCA  mutations, about 4% will have a TP53 mutation.  Other genes that we see relatively commonly with pathogenic variants causing breast cancer include PALB2, ATM and CHEK2.  We reviewed the characteristics, features and inheritance patterns of hereditary cancer syndromes. We also discussed genetic testing, including the appropriate family members to test, the process of testing, insurance coverage and turn-around-time for results. We discussed the implications of a negative, positive and/or variant of uncertain significant result. We recommended [name] pursue genetic testing for the Breast/Ovarian cancer gene panel. The Breast/Ovarian gene panel offered by [facility] includes sequencing and rearrangement analysis for the following 20 genes:  ATM, BARD1, BRCA1, BRCA2, BRIP1, CDH1, CHEK2, EPCAM, FANCC, MLH1, MSH2, MSH6, NBN, PALB2, PMS2, PTEN, RAD51C, RAD51D, TP53, and XRCC2.     Based on Ms. Chismar's personal history of cancer, she meets medical criteria for genetic testing. Despite that she meets criteria, she may still have an out of pocket cost. We discussed that if her out of pocket cost for testing is over $100, the laboratory will call and confirm whether she wants to proceed with testing.  If the out of pocket cost of testing is less than $100 she will be billed by the genetic testing laboratory.   PLAN: After considering the risks, benefits, and limitations, [name]  provided informed consent to pursue genetic testing and the blood sample was sent to [facility] for analysis of the Breast/Ovarian cancer panel. Results should be available within approximately 2-3 weeks' time, at which point they will be disclosed by telephone to [name], as will any additional recommendations warranted by these results. [name] will receive a summary of her genetic counseling visit and a copy of her results once available. This information will also be available in Epic. We encouraged Ms.  [name] to remain in contact with cancer genetics annually so that we can continuously update the family history and inform her of any changes in cancer genetics and testing that may be of benefit for her family. [name] questions were answered to her satisfaction today. Our contact information was provided should additional questions or concerns arise.  Lastly, we encouraged [name] to remain in contact with cancer genetics annually so that we can continuously update the family history and inform her of any changes in cancer genetics and testing that may be of benefit for this family.   Ms.  [name] questions were answered to her satisfaction today. Our contact information was provided should additional questions or concerns arise. Thank you for the referral and allowing [place] to share in the care of your patient.   [name] P. Florene Glen, [place], [facility] Certified Genetic Counselor [email or web address] phone: [phone]  The patient was seen for a total of 60 minutes in face-to-face  genetic counseling.  This patient was discussed with Drs. Magrinat, Lindi Adie and/or Burr Medico who agrees with the above.    _______________________________________________________________________ For Office Staff:  Number of people involved in session: 2 plus interpreter Was an Intern/ student involved with case: no

## 2015-03-26 ENCOUNTER — Other Ambulatory Visit: Payer: Self-pay | Admitting: Oncology

## 2015-04-04 ENCOUNTER — Telehealth: Payer: Self-pay | Admitting: Genetic Counselor

## 2015-04-04 ENCOUNTER — Encounter: Payer: Self-pay | Admitting: Genetic Counselor

## 2015-04-04 DIAGNOSIS — Z1379 Encounter for other screening for genetic and chromosomal anomalies: Secondary | ICD-10-CM | POA: Insufficient documentation

## 2015-04-04 NOTE — Telephone Encounter (Signed)
LM on phone with good news on test results.  Asked that she CB.

## 2015-04-04 NOTE — Telephone Encounter (Signed)
Julee called back through the sign language interpreter.  REvealed negative genetic test results on the breast/ovarian cancer panel.  Discussed that it would be important to keep in contact with [place] periodically in order to learn about other tests that she may benefit from.

## 2015-04-05 ENCOUNTER — Ambulatory Visit: Payer: Self-pay | Admitting: Genetic Counselor

## 2015-04-05 DIAGNOSIS — Z853 Personal history of malignant neoplasm of breast: Secondary | ICD-10-CM

## 2015-04-05 DIAGNOSIS — Z1379 Encounter for other screening for genetic and chromosomal anomalies: Secondary | ICD-10-CM

## 2015-04-05 NOTE — Progress Notes (Signed)
HPI: Ms. Cabeza was previously seen in the [facility] due to a personal history of cancer and concerns regarding a hereditary predisposition to cancer. Please refer to our prior cancer genetics clinic note for more information regarding Ms. Miltner's medical, social and family histories, and our assessment and recommendations, at the time. Ms. Dressel recent genetic test results were disclosed to her, as were recommendations warranted by these results. These results and recommendations are discussed in more detail below.  FAMILY HISTORY:  We obtained a detailed, 4-generation family history.  Significant diagnoses are listed below: Family History  Problem Relation Age of Onset  . Hypertension Mother   . Seizures Mother   . Alzheimer's disease Maternal Uncle   . Pancreatitis Maternal Grandmother   . Heart attack Maternal Grandfather     The patient has two daughters who are cancer free. She has a maternal 1/2 brother who is alive, and cancer free. She has met her father only 1 time when she was 42 and does not know anything about him or his family. Her mother is alive at 57. She has seizures. Her mother has one sister and two brothers. One brother died of Alzheimer's disease. The patient's maternal grandmother died of an infection in her pancreas that spread to her kidneys. Her grandfather died of a heart attack. Patient's maternal ancestors are of Caucasian and Native American descent, and paternal ancestors are of unknown descent. There is no reported Ashkenazi Jewish ancestry. There is no known consanguinity.  GENETIC TEST RESULTS: At the time of Ms. Muska's visit, we recommended she pursue genetic testing of the Breast/Ovarian cancer gene panel. The Breast/Ovarian gene panel offered by [facility] includes sequencing and rearrangement analysis for the following 20 genes:  ATM, BARD1, BRCA1, BRCA2, BRIP1, CDH1, CHEK2, EPCAM, FANCC, MLH1, MSH2, MSH6, NBN,  PALB2, PMS2, PTEN, RAD51C, RAD51D, TP53, and XRCC2.   The report date is April 04, 2015.  Genetic testing was normal, and did not reveal a deleterious mutation in these genes. The test report has been scanned into EPIC and is located under the Molecular Pathology section of the Results Review tab.   We discussed with Ms. Mcbroom that since the current genetic testing is not perfect, it is possible there may be a gene mutation in one of these genes that current testing cannot detect, but that chance is small. We also discussed, that it is possible that another gene that has not yet been discovered, or that we have not yet tested, is responsible for the cancer diagnoses in the family, and it is, therefore, important to remain in touch with cancer genetics in the future so that we can continue to offer Ms. Gargis the most up to date genetic testing.   CANCER SCREENING RECOMMENDATIONS:  This result is reassuring and indicates that Ms. Grothaus likely does not have an increased risk for a future cancer due to a mutation in one of these genes. This normal test also suggests that Ms. Swavely's cancer was most likely not due to an inherited predisposition associated with one of these genes.  Most cancers happen by chance and this negative test suggests that her cancer falls into this category.  We, therefore, recommended she continue to follow the cancer management and screening guidelines provided by her oncology and primary healthcare provider.   RECOMMENDATIONS FOR FAMILY MEMBERS: Women in this family might be at some increased risk of developing cancer, over the general population risk, simply due to the family history of  cancer. We recommended women in this family have a yearly mammogram beginning at age 72, or 47 years younger than the earliest onset of cancer, an an annual clinical breast exam, and perform monthly breast self-exams. Women in this family should also have a gynecological exam as  recommended by their primary provider. All family members should have a colonoscopy by age 57.  FOLLOW-UP: Lastly, we discussed with Ms. Dlouhy that cancer genetics is a rapidly advancing field and it is possible that new genetic tests will be appropriate for her and/or her family members in the future. We encouraged her to remain in contact with cancer genetics on an annual basis so we can update her personal and family histories and let her know of advances in cancer genetics that may benefit this family.   Our contact number was provided. Ms. Lococo questions were answered to her satisfaction, and she knows she is welcome to call us at anytime with additional questions or concerns.   Roma Kayser, MS, [facility] Certified Genetic Counselor [email or web address]

## 2015-04-21 ENCOUNTER — Other Ambulatory Visit: Payer: Self-pay | Admitting: Oncology

## 2015-04-22 ENCOUNTER — Ambulatory Visit: Payer: Medicaid Other | Admitting: Oncology

## 2015-04-22 ENCOUNTER — Other Ambulatory Visit: Payer: Self-pay | Admitting: Oncology

## 2015-04-22 DIAGNOSIS — Z853 Personal history of malignant neoplasm of breast: Secondary | ICD-10-CM

## 2015-04-22 DIAGNOSIS — Z1239 Encounter for other screening for malignant neoplasm of breast: Secondary | ICD-10-CM

## 2015-04-23 ENCOUNTER — Other Ambulatory Visit: Payer: Self-pay | Admitting: Oncology

## 2015-04-23 DIAGNOSIS — Z1231 Encounter for screening mammogram for malignant neoplasm of breast: Secondary | ICD-10-CM

## 2015-05-06 ENCOUNTER — Other Ambulatory Visit: Payer: Self-pay | Admitting: Oncology

## 2015-05-06 ENCOUNTER — Telehealth: Payer: Self-pay | Admitting: Oncology

## 2015-05-06 NOTE — Telephone Encounter (Signed)
Spoke with pt to confirm 5/25 appt per LL 4/24 pof

## 2015-06-06 ENCOUNTER — Ambulatory Visit (INDEPENDENT_AMBULATORY_CARE_PROVIDER_SITE_OTHER): Payer: Medicaid Other | Admitting: Family Medicine

## 2015-06-06 ENCOUNTER — Ambulatory Visit: Payer: No Typology Code available for payment source | Admitting: Oncology

## 2015-06-06 ENCOUNTER — Encounter: Payer: Self-pay | Admitting: Family Medicine

## 2015-06-06 ENCOUNTER — Ambulatory Visit
Admission: RE | Admit: 2015-06-06 | Discharge: 2015-06-06 | Disposition: A | Discharge status: Home / Self Care | Payer: Medicaid Other | Source: Ambulatory Visit | Attending: Family Medicine | Admitting: Family Medicine

## 2015-06-06 VITALS — BP 130/63 | HR 75 | Temp 97.5°F | Wt 138.0 lb

## 2015-06-06 DIAGNOSIS — M25552 Pain in left hip: Secondary | ICD-10-CM | POA: Diagnosis present

## 2015-06-06 MED ORDER — TRAMADOL HCL 50 MG PO TABS: 50.0000 mg | ORAL_TABLET | Freq: Three times a day (TID) | ORAL | Status: DC | PRN

## 2015-06-06 NOTE — Patient Instructions (Signed)
Please go get xrays today  I will call you with your results and let you know about if you should go get an MRI  Tramadol prescription for pain Use caution as this might make you sleepy  Be well, Dr. Ardelia Mems

## 2015-06-06 NOTE — Progress Notes (Signed)
Date of Visit: 06/06/2015   HPI:  Patient presents for a same day appointment to discuss pain in left leg. Sign language interpreter utilized during this encounter.    Pain has been present for 2-3 months. Has pain whenever she walks, stands, sleeps. Primarily located in L hip area and L thigh. Not getting any better. Has tried aleve, max cold spray. No preceding injury. Denies having fever, saddle anesthesia, lower extremity weakness, or problems with stooling or urination.    ROS: See HPI  [place]: history of breast cancer, BPPV, congenital deafness, prior BSO  PHYSICAL EXAM: BP 130/63 mmHg  Pulse 75  Temp(Src) 97.5 F (36.4 C) (Oral)  Wt 138 lb (62.596 kg) Gen: NAD, pleasant, cooperative Neuro: grossly nonfocal, signs fluently with sign language interpreter Extremities: no gross deformity to either leg. Marked antalgic gait. Pain with any movement of left leg/hip (passive or active). Strength exam limited by pain in hip/groin/thigh area but strength seems overall preserved. Sensation intact over bilateral lower extremities. Foot plantarflexion  & dorsiflexion intact on L. 2+ dp pulse on L.  ASSESSMENT/PLAN:  1. Left leg/hip pain - etiology unclear but at this time favor pathology of L hip joint. Neurovascularly intact (movement appears limited by pain rather than weakness). With her history of breast cancer at a young age, ruling out metastatic disease is vital. Plan: - tramadol for pain - xray of L hip today - if xray negative, will proceed with MRI with contrast to rule out metastatic disease & evaluate hip joint - offered crutches to patient to help with gait stability & ambulation but she declined  FOLLOW UP: Follow up by phone today after getting xray results.  [place]. Ardelia Mems, [place]

## 2015-06-07 ENCOUNTER — Telehealth: Payer: Self-pay | Admitting: Family Medicine

## 2015-06-07 DIAGNOSIS — M25552 Pain in left hip: Secondary | ICD-10-CM

## 2015-06-07 NOTE — Telephone Encounter (Signed)
Called patient to discuss xray results. Used sign language interpreter.  Took tramadol last night. It helped but was very strong. Able to walk a little better with the medicine.   Xray is normal without focal abnormalities in the left hip. Next step is MRI of hip. Will do with contrast given her history of cancer.  MRI ordered. Red team please schedule MRI & call patient with time of appointment  Thanks! Leeanne Rio, MD

## 2015-06-09 ENCOUNTER — Encounter (HOSPITAL_COMMUNITY): Payer: Self-pay

## 2015-06-09 ENCOUNTER — Emergency Department (HOSPITAL_COMMUNITY)
Admission: EM | Admit: 2015-06-09 | Discharge: 2015-06-10 | Disposition: A | Discharge status: Home / Self Care | Payer: Medicaid Other | Attending: Emergency Medicine | Admitting: Emergency Medicine

## 2015-06-09 DIAGNOSIS — H9190 Unspecified hearing loss, unspecified ear: Secondary | ICD-10-CM | POA: Insufficient documentation

## 2015-06-09 DIAGNOSIS — R1032 Left lower quadrant pain: Secondary | ICD-10-CM | POA: Diagnosis present

## 2015-06-09 DIAGNOSIS — Z87410 Personal history of cervical dysplasia: Secondary | ICD-10-CM | POA: Insufficient documentation

## 2015-06-09 DIAGNOSIS — Z79899 Other long term (current) drug therapy: Secondary | ICD-10-CM | POA: Insufficient documentation

## 2015-06-09 DIAGNOSIS — Z9851 Tubal ligation status: Secondary | ICD-10-CM | POA: Insufficient documentation

## 2015-06-09 DIAGNOSIS — R5383 Other fatigue: Secondary | ICD-10-CM | POA: Insufficient documentation

## 2015-06-09 DIAGNOSIS — R001 Bradycardia, unspecified: Secondary | ICD-10-CM | POA: Insufficient documentation

## 2015-06-09 DIAGNOSIS — R109 Unspecified abdominal pain: Secondary | ICD-10-CM

## 2015-06-09 DIAGNOSIS — Z3202 Encounter for pregnancy test, result negative: Secondary | ICD-10-CM | POA: Diagnosis not present

## 2015-06-09 DIAGNOSIS — Z853 Personal history of malignant neoplasm of breast: Secondary | ICD-10-CM | POA: Insufficient documentation

## 2015-06-09 LAB — COMPREHENSIVE METABOLIC PANEL
ALBUMIN: 3.8 g/dL (ref 3.5–5.0)
ALT: 15 U/L (ref 14–54)
ANION GAP: 7 (ref 5–15)
AST: 19 U/L (ref 15–41)
Alkaline Phosphatase: 83 U/L (ref 38–126)
BILIRUBIN TOTAL: 1.4 mg/dL — AB (ref 0.3–1.2)
BUN: 8 mg/dL (ref 6–20)
CO2: 28 mmol/L (ref 22–32)
Calcium: 9.6 mg/dL (ref 8.9–10.3)
Chloride: 104 mmol/L (ref 101–111)
Creatinine, Ser: 0.88 mg/dL (ref 0.44–1.00)
GFR calc Af Amer: >60 mL/min (ref >60)
GFR calc non Af Amer: >60 mL/min (ref >60)
GLUCOSE: 126 mg/dL — AB (ref 65–99)
POTASSIUM: 3.7 mmol/L (ref 3.5–5.1)
SODIUM: 139 mmol/L (ref 135–145)
Total Protein: 7.2 g/dL (ref 6.5–8.1)

## 2015-06-09 LAB — URINALYSIS, ROUTINE W REFLEX MICROSCOPIC
BILIRUBIN URINE: NEGATIVE
GLUCOSE, UA: NEGATIVE mg/dL
KETONES UR: NEGATIVE mg/dL
Leukocytes, UA: NEGATIVE
Nitrite: NEGATIVE
PH: 6 (ref 5.0–8.0)
Protein, ur: NEGATIVE mg/dL
SPECIFIC GRAVITY, URINE: 1.023 (ref 1.005–1.030)

## 2015-06-09 LAB — CBC
HEMATOCRIT: 35.4 % — AB (ref 36.0–46.0)
HEMOGLOBIN: 11.8 g/dL — AB (ref 12.0–15.0)
MCH: 28.6 pg (ref 26.0–34.0)
MCHC: 33.3 g/dL (ref 30.0–36.0)
MCV: 85.9 fL (ref 78.0–100.0)
Platelets: 237 10*3/uL (ref 150–400)
RBC: 4.12 MIL/uL (ref 3.87–5.11)
RDW: 12.3 % (ref 11.5–15.5)
WBC: 4.4 10*3/uL (ref 4.0–10.5)

## 2015-06-09 LAB — URINE MICROSCOPIC-ADD ON: WBC UA: NONE SEEN WBC/hpf (ref 0–5)

## 2015-06-09 LAB — LIPASE, BLOOD: Lipase: 19 U/L (ref 11–51)

## 2015-06-09 MED ORDER — HYDROMORPHONE HCL 1 MG/ML IJ SOLN
1.0000 mg | Freq: Once | INTRAMUSCULAR | Status: AC
  Administered 2015-06-10: 1 mg via INTRAVENOUS
  Filled 2015-06-09: qty 1

## 2015-06-09 MED ORDER — ONDANSETRON HCL 4 MG/2ML IJ SOLN
4.0000 mg | Freq: Once | INTRAMUSCULAR | Status: AC
  Administered 2015-06-09: 4 mg via INTRAVENOUS
  Filled 2015-06-09: qty 2

## 2015-06-09 MED ORDER — MORPHINE SULFATE (PF) 4 MG/ML IV SOLN
6.0000 mg | Freq: Once | INTRAVENOUS | Status: AC
  Administered 2015-06-09: 6 mg via INTRAVENOUS
  Filled 2015-06-09: qty 2

## 2015-06-09 MED ORDER — SODIUM CHLORIDE 0.9 % IV BOLUS (SEPSIS)
1000.0000 mL | Freq: Once | INTRAVENOUS | Status: AC
  Administered 2015-06-09: 1000 mL via INTRAVENOUS

## 2015-06-09 NOTE — ED Notes (Signed)
Pt is deaf.  Have called for interpreter.  Through written communication with pt, pt reports onset 2-3 months of abd pain, worsened today.  Has vomited x 2 today, blood noted and nausea.

## 2015-06-09 NOTE — ED Notes (Signed)
Sign language interpreter called @ 2117

## 2015-06-09 NOTE — ED Provider Notes (Signed)
CSN: [id number]     Arrival date & time 06/09/15  2111 History   First MD Initiated Contact with Patient 06/09/15 2128     Chief Complaint  Patient presents with  . Abdominal Pain     (Consider location/radiation/quality/duration/timing/severity/associated sxs/prior Treatment) Patient is a 50 y.o. female presenting with abdominal pain.  Abdominal Pain Pain location:  LLQ Pain quality: aching, pressure and sharp   Pain radiates to:  Does not radiate Pain severity:  Mild Onset quality:  Gradual Duration:  8 weeks Timing:  Constant Progression:  Worsening Chronicity:  New Context: not alcohol use, not previous surgeries and not retching   Relieved by:  None tried Worsened by:  Nothing tried Ineffective treatments:  None tried Associated symptoms: fatigue   Associated symptoms: no anorexia, no chills, no dysuria, no flatus, no melena and no shortness of breath     Past Medical History  Diagnosis Date  . Congenital deafness   . CIN I (cervical intraepithelial neoplasia I) 2003    by colpo  . S/P bilateral oophorectomy   . Deaf   . Cancer ([place]) 1999    breast-s/p mastectomy, chemo, rad  . Breast cancer ([place])   . Abnormal Pap smear    Past Surgical History  Procedure Laterality Date  . Mastectomy    . Ovarian cyst removal    . Mastectomy    . Tubal ligation     Family History  Problem Relation Age of Onset  . Hypertension Mother   . Seizures Mother   . Alzheimer's disease Maternal Uncle   . Pancreatitis Maternal Grandmother   . Heart attack Maternal Grandfather    Social History  Substance Use Topics  . Smoking status: Never Smoker   . Smokeless tobacco: Never Used  . Alcohol Use: No   OB History    Gravida Para Term Preterm AB TAB SAB Ectopic Multiple Living   2 2 1 1  0 0 0 0 0 2     Review of Systems  Constitutional: Positive for fatigue. Negative for chills.  Respiratory: Negative for shortness of breath.   Gastrointestinal: Positive for abdominal  pain. Negative for melena, anorexia and flatus.  Genitourinary: Negative for dysuria.  All other systems reviewed and are negative.     Allergies  Diphenhydramine hcl and Promethazine hcl  Home Medications   Prior to Admission medications   Medication Sig Start Date End Date Taking? Authorizing Provider  divalproex (DEPAKOTE) 250 MG DR tablet Take 2 tablets (500 mg total) by mouth at bedtime. 07/24/14   Dennie Bible, NP  traMADol (ULTRAM) 50 MG tablet Take 1 tablet (50 mg total) by mouth every 8 (eight) hours as needed for severe pain. 06/06/15   Leeanne Rio, MD   BP 106/75 mmHg  Pulse 81  Temp(Src) 98.1 F (36.7 C) (Oral)  Resp 18  SpO2 98% Physical Exam  Constitutional: She appears well-developed and well-nourished.  HENT:  Head: Normocephalic and atraumatic.  Neck: Normal range of motion.  Cardiovascular: Regular rhythm.  Bradycardia present.   Pulmonary/Chest: No stridor. No respiratory distress.  Abdominal: She exhibits no distension. There is tenderness (LLQ).  Musculoskeletal: She exhibits no edema or tenderness.  Neurological: She is alert.  Nursing note and vitals reviewed.   ED Course  Procedures (including critical care time) Labs Review Labs Reviewed  CBC - Abnormal; Notable for the following:    Hemoglobin 11.8 (*)    HCT 35.4 (*)    All other components within  normal limits  LIPASE, BLOOD  COMPREHENSIVE METABOLIC PANEL  URINALYSIS, ROUTINE W REFLEX MICROSCOPIC (NOT AT [facility])  POC URINE PREG, ED    Imaging Review No results found. I have personally reviewed and evaluated these images and lab results as part of my medical decision-making.   EKG Interpretation None      MDM   Final diagnoses:  Abdominal pain in female patient   Concern for possible metastasis from breast cancer and pathologic fracture v divverticulitis. Plan for labs and ct scan and symptomatic treatment.   If all normal, will dc to continue following with PCP.    Care transferred pending ct scan with plan for discharge.     Merrily Pew, MD 06/10/15 [phone]

## 2015-06-10 ENCOUNTER — Emergency Department (HOSPITAL_COMMUNITY): Payer: Medicaid Other

## 2015-06-10 ENCOUNTER — Encounter (HOSPITAL_COMMUNITY): Payer: Self-pay

## 2015-06-10 LAB — PREGNANCY, URINE: PREG TEST UR: NEGATIVE

## 2015-06-10 MED ORDER — IOPAMIDOL (ISOVUE-300) INJECTION 61%
INTRAVENOUS | Status: AC
  Administered 2015-06-10: 100 mL
  Filled 2015-06-10: qty 100

## 2015-06-10 NOTE — ED Notes (Signed)
Trauma MD at the bedside. 

## 2015-06-10 NOTE — ED Notes (Signed)
2 mg Morphine IV waist on sharps Gerald Stabs Chrisco witnessed waist. Pt already gone and out of the pyxis.

## 2015-06-11 NOTE — Telephone Encounter (Signed)
Left message for her to call us back. Her MRI is scheduled for Tuesday June 6th @4 . Must arrive at 3:45. Jamaar Howes Kennon Holter, CMA

## 2015-06-12 ENCOUNTER — Emergency Department (HOSPITAL_COMMUNITY)
Admission: EM | Admit: 2015-06-12 | Discharge: 2015-06-12 | Disposition: A | Discharge status: Home / Self Care | Payer: Medicaid Other | Attending: Emergency Medicine | Admitting: Emergency Medicine

## 2015-06-12 ENCOUNTER — Encounter (HOSPITAL_COMMUNITY): Payer: Self-pay | Admitting: Emergency Medicine

## 2015-06-12 DIAGNOSIS — Z853 Personal history of malignant neoplasm of breast: Secondary | ICD-10-CM | POA: Diagnosis not present

## 2015-06-12 DIAGNOSIS — R109 Unspecified abdominal pain: Secondary | ICD-10-CM | POA: Diagnosis present

## 2015-06-12 DIAGNOSIS — R112 Nausea with vomiting, unspecified: Secondary | ICD-10-CM | POA: Diagnosis not present

## 2015-06-12 DIAGNOSIS — Z79891 Long term (current) use of opiate analgesic: Secondary | ICD-10-CM | POA: Insufficient documentation

## 2015-06-12 DIAGNOSIS — R1012 Left upper quadrant pain: Secondary | ICD-10-CM | POA: Insufficient documentation

## 2015-06-12 DIAGNOSIS — Z79899 Other long term (current) drug therapy: Secondary | ICD-10-CM | POA: Diagnosis not present

## 2015-06-12 MED ORDER — HYDROCODONE-ACETAMINOPHEN 5-325 MG PO TABS
1.0000 | ORAL_TABLET | Freq: Once | ORAL | Status: AC
  Administered 2015-06-12: 1 via ORAL
  Filled 2015-06-12: qty 1

## 2015-06-12 MED ORDER — ONDANSETRON 4 MG PO TBDP: 4.0000 mg | ORAL_TABLET | ORAL | Status: DC | PRN

## 2015-06-12 MED ORDER — DOCUSATE SODIUM 100 MG PO CAPS: 100.0000 mg | ORAL_CAPSULE | Freq: Two times a day (BID) | ORAL | Status: DC

## 2015-06-12 MED ORDER — HYDROCODONE-ACETAMINOPHEN 5-325 MG PO TABS: 1.0000 | ORAL_TABLET | ORAL | Status: DC | PRN

## 2015-06-12 MED ORDER — ONDANSETRON 4 MG PO TBDP
4.0000 mg | ORAL_TABLET | Freq: Once | ORAL | Status: AC
  Administered 2015-06-12: 4 mg via ORAL
  Filled 2015-06-12: qty 1

## 2015-06-12 NOTE — Discharge Instructions (Signed)
Abdominal Pain, Adult At this time, the cause of your abdominal pain is unknown. You had a CT scan done 2 days ago that did not show evidence of bowel blockage  or inflammation, no kidney stones were identified, no evident tumors were identified. Your blood work in your urine were normal and did not suggest infection or anemia. It is however very important that you follow up with your family doctor and your oncologist. You have had cancer in the past and sometimes, tests that cannot be done in the emergency department must be scheduled through your physicians to test for colon cancer or other cancers that cannot be identified early on a CT scan. You may need a colonoscopy or a PET scan. These tests will need to be ordered by your oncologist or family physician if they think they are appropriate for you. At this time, your pain and nausea will be treated with Vicodin and Zofran. Take Colace with Vicodin to avoid developing constipation which can occur with narcotic pain medicines. Many things can cause abdominal pain. Usually, abdominal pain is not caused by a disease and will improve without treatment. It can often be observed and treated at home. Your health care provider will do a physical exam and possibly order blood tests and X-rays to help determine the seriousness of your pain. However, in many cases, more time must pass before a clear cause of the pain can be found. Before that point, your health care provider may not know if you need more testing or further treatment. HOME CARE INSTRUCTIONS Monitor your abdominal pain for any changes. The following actions may help to alleviate any discomfort you are experiencing:  Only take over-the-counter or prescription medicines as directed by your health care provider.  Do not take laxatives unless directed to do so by your health care provider.  Try a clear liquid diet (broth, tea, or water) as directed by your health care provider. Slowly move to a bland  diet as tolerated. SEEK MEDICAL CARE IF:  You have unexplained abdominal pain.  You have abdominal pain associated with nausea or diarrhea.  You have pain when you urinate or have a bowel movement.  You experience abdominal pain that wakes you in the night.  You have abdominal pain that is worsened or improved by eating food.  You have abdominal pain that is worsened with eating fatty foods.  You have a fever. SEEK IMMEDIATE MEDICAL CARE IF:  Your pain does not go away within 2 hours.  You keep throwing up (vomiting).  Your pain is felt only in portions of the abdomen, such as the right side or the left lower portion of the abdomen.  You pass bloody or black tarry stools. MAKE SURE YOU:  Understand these instructions.  Will watch your condition.  Will get help right away if you are not doing well or get worse.   This information is not intended to replace advice given to you by your health care provider. Make sure you discuss any questions you have with your health care provider.   Document Released: 10/08/2004 Document Revised: 09/19/2014 Document Reviewed: 09/07/2012 Elsevier Interactive Patient Education [employer].

## 2015-06-12 NOTE — Telephone Encounter (Signed)
Pt calling stating that she needs doctors orders to be put to sleep for her appt per MRI department. She states that she can not do it while she is awake. Pt wants dr to call her back. [name], CMA

## 2015-06-12 NOTE — ED Notes (Signed)
Pt informed that a in-person sign language interpreter was not available immediately and that there was not a guarantee that an in-person one would become available; pt given option of electronic sign language interpreter that was available immediately; pt shook head at this option

## 2015-06-12 NOTE — ED Notes (Signed)
Pt has declined use of electronic sign-language interpreter; delay in triage

## 2015-06-12 NOTE — ED Notes (Addendum)
Pt made staff aware that she is deaf; when this nurse entered room with electronic translator, pt made motions indicating she was displeased with this; This nurse wrote on paper "Why don't you want a translator"; pt responded in writing "I don't like them; I need a real live person"; pt informed that this would delay her treatment and care; pt shrugged shoulders; secretary notified to call in live sign language translator

## 2015-06-12 NOTE — ED Notes (Signed)
Pt reports to ER from home; pt has had abdominal pain for about 3 months; pain worsened earlier this week and went to [facility]; pain is still present so pt returned to the ER; pt states she vomited 3 times yesterday as well.

## 2015-06-13 NOTE — Telephone Encounter (Signed)
Attempted to reach patient. No answer. Left answer on machine using phone sign language interpreter asking her to call us back.  Leeanne Rio, MD

## 2015-06-13 NOTE — Telephone Encounter (Signed)
Pt returned dr Ellie Lunch call.  She will be at home the rest of the afternoon

## 2015-06-14 NOTE — Telephone Encounter (Signed)
MRI w/ sedation is scheduled for 6/13 @ 8AM at [facility]. Pt is aware.

## 2015-06-14 NOTE — Telephone Encounter (Signed)
Reordered to include sedation with general anesthesia  Leeanne Rio, MD

## 2015-06-14 NOTE — Telephone Encounter (Signed)
MRI was denied by [employer] I called and did peer to peer and got approval Authorization # [id number]  Leeanne Rio, MD

## 2015-06-14 NOTE — Addendum Note (Signed)
Addended by: Leeanne Rio on: 06/14/2015 03:53 PM   Modules accepted: Orders

## 2015-06-14 NOTE — Telephone Encounter (Signed)
Patient called again and I spoke with her via interpreter. She is not interested in oral anti anxiety medications She wants to be put entirely to sleep for the MRI Spoke with Tia who is going to work on seeing how we can make this happen  Leeanne Rio, MD

## 2015-06-16 ENCOUNTER — Other Ambulatory Visit: Payer: Self-pay | Admitting: Oncology

## 2015-06-16 NOTE — ED Provider Notes (Signed)
CSN: [id number]     Arrival date & time 06/12/15  2059 History   First MD Initiated Contact with Patient 06/12/15 2141     Chief Complaint  Patient presents with  . Abdominal Pain     (Consider location/radiation/quality/duration/timing/severity/associated sxs/prior Treatment) HPI Initial interview done with written notes. Patient had refused Financial trader. Completion of encountered on with in person translator present. Patient has ongoing abdominal pain in her left mid quadrant. This is been an ongoing problem for 3 months duration. Patient reports that she continues to have episodes of vomiting despite treatment with Zofran which does not work for her. She states her pain has never improved. Patient was seen at her family physician on 5\25 or left sided hip pain. That encounter indicates the patient will be scheduled for MRI. She had negative basic x-rays done. She was subsequently seen in the emergency department on 5/28 at which time diagnostic workup included lab, CT scan abdomen and urinalysis. Workup was normal and patient was discharged to follow-up. Past Medical History  Diagnosis Date  . Congenital deafness   . CIN I (cervical intraepithelial neoplasia I) 2003    by colpo  . S/P bilateral oophorectomy   . Deaf   . Cancer ([place]) 1999    breast-s/p mastectomy, chemo, rad  . Breast cancer ([place])   . Abnormal Pap smear    Past Surgical History  Procedure Laterality Date  . Mastectomy    . Ovarian cyst removal    . Mastectomy    . Tubal ligation     Family History  Problem Relation Age of Onset  . Hypertension Mother   . Seizures Mother   . Alzheimer's disease Maternal Uncle   . Pancreatitis Maternal Grandmother   . Heart attack Maternal Grandfather    Social History  Substance Use Topics  . Smoking status: Never Smoker   . Smokeless tobacco: Never Used  . Alcohol Use: No   OB History    Gravida Para Term Preterm AB TAB SAB Ectopic Multiple Living   2 2 1 1  0  0 0 0 0 2     Review of Systems  Constitutional: Patient has not had fevers or chills. Respiratory: No shortness of breath or cough GU: No pain burning or urgency. Urination.   Allergies  Diphenhydramine hcl and Promethazine hcl  Home Medications   Prior to Admission medications   Medication Sig Start Date End Date Taking? Authorizing Provider  divalproex (DEPAKOTE) 250 MG DR tablet Take 2 tablets (500 mg total) by mouth at bedtime. 07/24/14   Dennie Bible, NP  docusate sodium (COLACE) 100 MG capsule Take 1 capsule (100 mg total) by mouth every 12 (twelve) hours. 06/12/15   Charlesetta Shanks, MD  HYDROcodone-acetaminophen (NORCO/VICODIN) 5-325 MG tablet Take 1-2 tablets by mouth every 4 (four) hours as needed for moderate pain or severe pain. 06/12/15   Charlesetta Shanks, MD  ondansetron (ZOFRAN ODT) 4 MG disintegrating tablet Take 1 tablet (4 mg total) by mouth every 4 (four) hours as needed for nausea or vomiting. 06/12/15   Charlesetta Shanks, MD  traMADol (ULTRAM) 50 MG tablet Take 1 tablet (50 mg total) by mouth every 8 (eight) hours as needed for severe pain. 06/06/15   Leeanne Rio, MD   BP 116/77 mmHg  Pulse 75  Temp(Src) 97.9 F (36.6 C) (Oral)  Resp 18  SpO2 100% Physical Exam  Constitutional: She is oriented to person, place, and time. She appears well-developed and well-nourished.  Patient has well appearance. She is well-groomed and physically well maintained. She does not appear to be in acute distress.  HENT:  Head: Normocephalic and atraumatic.  Mouth/Throat: Oropharynx is clear and moist.  Eyes: EOM are normal. No scleral icterus.  Neck: Neck supple.  Cardiovascular: Normal rate, regular rhythm, normal heart sounds and intact distal pulses.   Pulmonary/Chest: Effort normal and breath sounds normal.  Abdominal: Soft. Bowel sounds are normal. She exhibits no distension. There is tenderness.  Patient endorses tenderness palpation in the left mid quadrant. There  is no guarding or rebound in this region there is no palpable mass.  Musculoskeletal: Normal range of motion. She exhibits no edema or tenderness.  Extremities are good condition. Lower extremity do not have edema.  Neurological: She is alert and oriented to person, place, and time. She has normal strength. No cranial nerve deficit. She exhibits normal muscle tone. Coordination normal. GCS eye subscore is 4. GCS verbal subscore is 5. GCS motor subscore is 6.  Skin: Skin is warm, dry and intact.  Psychiatric: She has a normal mood and affect.    ED Course  Procedures (including critical care time) Labs Review Labs Reviewed - No data to display  Imaging Review No results found. I have personally reviewed and evaluated these images and lab results as part of my medical decision-making.   EKG Interpretation None      MDM   Final diagnoses:  Left upper quadrant pain  Non-intractable vomiting with nausea, vomiting of unspecified type   Patient presents as a 3 month history of left-sided abdominal pain. Clinical examination shows the patient to be well with normal vital signs and a nonsurgical abdomen without appreciable mass. 2 days ago the patient had a CT scan that did not show any acute findings. She also had CBC and basic chemistry panel with LFTs which were within normal limits. She had a negative urinalysis and a negative pregnancy test. At this time, based on patient's clinical appearance and the duration of her abdominal pain I did not feel that repeat diagnostic studies were indicated within that 2 days that of transpired. He shows no clinical signs of dehydration or surgical abdomen. To me, she clearly indicated her pain to be in her mid left abdomen she is also be valuable by her family physician for hip pain. There is a scheduled MRI to further evaluate this. At this time, I did not feel that an emergent MRI was indicated particularly in light of the fact that her pain as described  in the emergency department did not seem to correlate with hip pain. I have counseled the patient however with her history of breast cancer is very important that she continues to go to all follow-up appointments and take sure that from the perspective of her oncologist and her family doctor they do not feel there could be any relation to recurrence of cancer. I counseled the patient that a cancer diagnosis could not be completely ruled out in the emergency department with the tests we have available. Clinically and based on the existing tests this does not appear to be the case however follow-up will be necessary. All of this was communicated through the translator who was present at the end of the encounter. The patient was very dissatisfied and hostile because no diagnosis was identified. When I explained why this was the case her response was "what ever".    Charlesetta Shanks, MD 06/16/15 1022

## 2015-06-18 ENCOUNTER — Ambulatory Visit (HOSPITAL_COMMUNITY): Admission: RE | Admit: 2015-06-18 | Payer: Medicaid Other | Source: Ambulatory Visit

## 2015-06-20 ENCOUNTER — Ambulatory Visit (HOSPITAL_BASED_OUTPATIENT_CLINIC_OR_DEPARTMENT_OTHER): Payer: Medicaid Other | Admitting: Oncology

## 2015-06-20 ENCOUNTER — Encounter: Payer: Self-pay | Admitting: Oncology

## 2015-06-20 VITALS — BP 114/71 | HR 65 | Temp 97.8°F | Resp 18 | Ht 67.0 in | Wt 136.6 lb

## 2015-06-20 DIAGNOSIS — Z90722 Acquired absence of ovaries, bilateral: Secondary | ICD-10-CM

## 2015-06-20 DIAGNOSIS — M25552 Pain in left hip: Secondary | ICD-10-CM | POA: Diagnosis not present

## 2015-06-20 DIAGNOSIS — M8580 Other specified disorders of bone density and structure, unspecified site: Secondary | ICD-10-CM

## 2015-06-20 DIAGNOSIS — Z853 Personal history of malignant neoplasm of breast: Secondary | ICD-10-CM | POA: Diagnosis not present

## 2015-06-20 DIAGNOSIS — Z9079 Acquired absence of other genital organ(s): Secondary | ICD-10-CM

## 2015-06-20 DIAGNOSIS — R1032 Left lower quadrant pain: Secondary | ICD-10-CM

## 2015-06-20 DIAGNOSIS — E8940 Asymptomatic postprocedural ovarian failure: Secondary | ICD-10-CM

## 2015-06-20 NOTE — Progress Notes (Signed)
OFFICE PROGRESS NOTE   June 20, 2015   Physicians: Chrisandra Netters [facility]), C.Harraway Janelle Floor (neurology)  INTERVAL HISTORY:  Patient is seen, together with sign language interpreter, with remote history of node + right breast cancer at age 50 in 11-1997. Since she was seen in 02-2015, she had left tomo mammogram at [facility] 03-15-15, heterogeneously dense tissue but no other mammographic findings of concern. She also had genetics testing, negative Breast Ovarian panel 03-2015. DEXA scan 03-14-15 was osteopenic, patient post oophorectomy in 2005.    Patient reports LLQ abdominal pain/ left hip pain for 4-6 weeks. She was seen in ED 06-10-15 with CT which did not identify any acute problems in abdomen or pelvis; plain xrays hip and pelvis likewise not remarkable. She is for MRI hip on 6-13, which patient has requested be done with general anesthesia.  Patient is not complaining of any breast symptoms today. She denies trauma, bowels unchanged, no bleeding, no rash, no LE swelling, no back pain.     No PAC Genetics testing negative by Breast Ovarian panel 03-2015  ONCOLOGIC HISTORY Patient had 1.4 cm poorly differentiated right breast carcinoma diagnosed in Nov 1999 at age 45, 1/7 axillary nodes involved, ER/PR and HER 2 positive. She had mastectomy with the 7 axillary node evaluation, 4 cycles of adriamycin/ cytoxan followed by taxotere, then five years of tamoxifen thru June 2005 (no herceptin in 1999). She had right TRAM reconstruction. She had bilateral salpingoophorectomy in May 2005. She was briefly on aromatase inhibitor after tamoxifen, but discontinued this herself due to poor tolerance. Most recent left mammogram was done as screening on 04-15-2012 with heterogeneously dense tissue and questioned asymmetry, followed by diagnostic left mamogram on 04-28-12 which had no findings of concern.  Objective:  Vital signs in last 24 hours:  BP 114/71 mmHg  Pulse 65   Temp(Src) 97.8 F (36.6 C) (Oral)  Resp 18  Ht 5\' 7"  (1.702 m)  Wt 136 lb 9.6 oz (61.961 kg)  BMI 21.39 kg/m2  SpO2 100% Weight down 4 lbs Alert, oriented and cooperative. Seated on exam table when I came into room, able to change position on table without difficulty. Almost dramatically limping and leaning over as she leaves office.   HEENT:PERRL, sclerae not icteric. Oral mucosa moist without lesions Lymphatics:no cervical,suraclavicular, axillary or inguinal adenopathy Resp: clear to auscultation bilaterally and normal percussion bilaterally Cardio: regular rate and rhythm. No gallop. GI: soft,  not distended, no mass or organomegaly. Dramatic reaction to light palpation of LLQ in region of well healed scar. Normally active bowel sounds.  Musculoskeletal/ Extremities: without pitting edema, cords, tenderness Neuro: moves extremities easily. Deafness.  Skin without rash, ecchymosis, petechiae Breasts: Right TRAM. Left without dominant mass, skin or nipple findings. Axillae benign.  Lab Results:  Results for orders placed or performed during the hospital encounter of 06/09/15  Lipase, blood  Result Value Ref Range   Lipase 19 11 - 51 U/L  Comprehensive metabolic panel  Result Value Ref Range   Sodium 139 135 - 145 mmol/L   Potassium 3.7 3.5 - 5.1 mmol/L   Chloride 104 101 - 111 mmol/L   CO2 28 22 - 32 mmol/L   Glucose, Bld 126 (H) 65 - 99 mg/dL   BUN 8 6 - 20 mg/dL   Creatinine, Ser 0.88 0.44 - 1.00 mg/dL   Calcium 9.6 8.9 - 10.3 mg/dL   Total Protein 7.2 6.5 - 8.1 g/dL   Albumin 3.8 3.5 -  5.0 g/dL   AST 19 15 - 41 U/L   ALT 15 14 - 54 U/L   Alkaline Phosphatase 83 38 - 126 U/L   Total Bilirubin 1.4 (H) 0.3 - 1.2 mg/dL   GFR calc non Af Amer >60 >60 mL/min   GFR calc Af Amer >60 >60 mL/min   Anion gap 7 5 - 15  CBC  Result Value Ref Range   WBC 4.4 4.0 - 10.5 K/uL   RBC 4.12 3.87 - 5.11 MIL/uL   Hemoglobin 11.8 (L) 12.0 - 15.0 g/dL   HCT 35.4 (L) 36.0 - 46.0 %    MCV 85.9 78.0 - 100.0 fL   MCH 28.6 26.0 - 34.0 pg   MCHC 33.3 30.0 - 36.0 g/dL   RDW 12.3 11.5 - 15.5 %   Platelets 237 150 - 400 K/uL  Urinalysis, Routine w reflex microscopic  Result Value Ref Range   Color, Urine YELLOW YELLOW   APPearance CLEAR CLEAR   Specific Gravity, Urine 1.023 1.005 - 1.030   pH 6.0 5.0 - 8.0   Glucose, UA NEGATIVE NEGATIVE mg/dL   Hgb urine dipstick MODERATE (A) NEGATIVE   Bilirubin Urine NEGATIVE NEGATIVE   Ketones, ur NEGATIVE NEGATIVE mg/dL   Protein, ur NEGATIVE NEGATIVE mg/dL   Nitrite NEGATIVE NEGATIVE   Leukocytes, UA NEGATIVE NEGATIVE  Urine microscopic-add on  Result Value Ref Range   Squamous Epithelial / LPF 0-5 (A) NONE SEEN   WBC, UA NONE SEEN 0 - 5 WBC/hpf   RBC / HPF 0-5 0 - 5 RBC/hpf   Bacteria, UA RARE (A) NONE SEEN  Pregnancy, urine  Result Value Ref Range   Preg Test, Ur NEGATIVE NEGATIVE     Studies/Results:  DIGITAL SCREENING UNILATERAL LEFT MAMMOGRAM WITH TOMO AND CAD 03-15-15  COMPARISON: Previous exam(s).  ACR Breast Density Category c: The breast tissue is heterogeneously dense, which may obscure small masses.  FINDINGS: There are no findings suspicious for malignancy. Images were processed with CAD.  IMPRESSION: No mammographic evidence of malignancy. A result letter of this screening mammogram will be mailed directly to the patient.  RECOMMENDATION: Screening mammogram in one year. (Code:SM-B-01Y)  BI-RADS CATEGORY 1: Negative.    EXAM: DG HIP (WITH OR WITHOUT PELVIS) 2-3V LEFT  06-06-15  COMPARISON: None.  FINDINGS: No significant degenerative change of the left hip is seen with relatively well preserved left hip joint space. The left pelvic ramus is intact. The left SI joint appears corticated.  IMPRESSION: Negative.   EXAM: CT ABDOMEN AND PELVIS WITH CONTRAST  06-10-15  COMPARISON: CT of the abdomen and pelvis from 09/25/2013, and abdominal ultrasound performed  05/09/2005  FINDINGS: Minimal bibasilar atelectasis is noted.  A 6 mm hypodensity is noted within the liver. A few small calcified granulomata are noted within the spleen. The liver and spleen are otherwise unremarkable. The mildly heterogeneous appearance of the spleen likely reflects the phase of contrast enhancement. The patient is status post cholecystectomy, with clips noted at the gallbladder fossa. The pancreas and adrenal glands are unremarkable.  A 1.1 cm left renal cyst is noted. The kidneys are otherwise unremarkable. There is no evidence of hydronephrosis. No renal or ureteral stones are seen. No perinephric stranding is appreciated.  No free fluid is identified. The small bowel is unremarkable in appearance. The stomach is within normal limits. No acute vascular abnormalities are seen.  The appendix is normal in caliber, without evidence of appendicitis. The colon is grossly unremarkable in appearance.  The bladder is mildly distended and grossly unremarkable. The uterus is unremarkable in appearance. The ovaries are grossly symmetric. No suspicious adnexal masses are seen. No inguinal lymphadenopathy is seen.  No acute osseous abnormalities are identified.  IMPRESSION: 1. No acute abnormality seen within the abdomen or pelvis. 2. Small left renal cyst noted. 3. 6 mm nonspecific hypodensity within the liver has changed only minimally in size from 2015 and is likely benign.  Imaging reviewed in PACs by this MD at time of visit.  Medications: I have reviewed the patient's current medications.  DISCUSSION I have told patient that fortunately nothing on CTs or plain xrays suggests cancer recurrence. I will be glad to hear about results of MRI if any concerns from oncology standpoint.  NOTE: tho records mostly predate this EMR, I recall that oophorectomy in ~ 2005 was done due to prolonged complaints of severe pain felt possibly  related to ovarian cysts.  Please see operative note and some ED notes from 2005 and 2002 which are scanned into Notes section this EMR with incorrect dates of "05-2010".   Assessment/Plan:  1.T1N1 premenopausal right breast carcinoma ER/PR +, HER 2 + diagnosed Nov 1999, post treatment as above and now on observation. Left mammogram and genetics counseling completed as above. She needs yearly mammogram. She could follow at this office prn as she is out 18 years from the breast cancer diagnosis. Right TRAM 2.premature surgical menopause, post BSO 2005 for pelvic pain and ovarian cysts. 3.congenital deafness 4.positional vertigo: followed by neurology. Note she does not tolerate lying on stomach due to vertigo, in case breast MRI considered 5.post laparoscopic cholecystectomy 2004 6.post oophorectomy 2005 done because of pelvic pain, tho also reasonable from standpoint of the breast cancer.  Communication with assistance from sign language interpreter, tho patient did not want interpreter in room during physical exam. Time spent 20 min including review of information from previous EMR. CC Dr Rockwell Germany, MD   06/20/2015, 6:22 PM

## 2015-06-21 ENCOUNTER — Encounter (HOSPITAL_COMMUNITY): Payer: Self-pay | Admitting: *Deleted

## 2015-06-21 NOTE — Progress Notes (Signed)
PAT phone call completed using interpreter phone.  Pt verbalized understanding of location, time of arrival, NPO after midnight.  Pt stated she will have family to drive home.  Interpreter scheduled for day of procedure.

## 2015-06-21 NOTE — Progress Notes (Signed)
Called and left message for Dr. Ardelia Mems at [facility] for H&P to be completed prior to pt's MRI with sedation.

## 2015-06-24 NOTE — H&P (Signed)
Subjective:   Patient presents due to pain in left leg. Sign language interpreter utilized.   Pain has been present for 2-3 months. Has pain whenever she walks, stands, sleeps. Primarily located in L hip area and L thigh. Not getting any better. Has tried aleve, max cold spray. No preceding injury. Denies having fever, saddle anesthesia, lower extremity weakness, or problems with stooling or urination.   Patient has history of breast cancer, BPPV, congenital deafness, prior BSO  Reports she cannot tolerate an MRI unless she is entirely put to sleep for it. Requests MRI with sedation.  Patient Active Problem List   Diagnosis Date Noted  . Genetic testing 04/04/2015  . Congenital deafness 02/26/2015  . Noncompliance with therapeutic plan 02/26/2015  . Hx of BSO (bilateral salpingo-oophorectomy) 02/26/2015  . Premature surgical menopause 02/26/2015  . Benign paroxysmal positional vertigo 07/24/2014  . Migraine 07/24/2014  . RLQ abdominal pain 09/30/2013  . Dysuria 09/30/2013  . Hx of breast cancer 04/28/2013  . Colon cancer screening 04/28/2013  . Migraine without aura, with intractable migraine, so stated, without mention of status migrainosus 06/22/2012  . Dizziness 05/14/2011  . Pharyngitis, acute 09/04/2010  . BACK STRAIN, LUMBAR 01/21/2010  . DE QUERVAIN'S TENOSYNOVITIS, LEFT WRIST 10/07/2009  . EMESIS 02/05/2009  . ABDOMINAL PAIN, GENERALIZED 02/05/2009  . DIZZINESS, CHRONIC 01/25/2007  . INSOMNIA 01/25/2007  . Hearing loss 03/11/2006  . CERVICAL DYSPLASIA 03/11/2006  . MENSTRUATION, PAINFUL 03/11/2006   Past Medical History  Diagnosis Date  . Congenital deafness   . CIN I (cervical intraepithelial neoplasia I) 2003    by colpo  . S/P bilateral oophorectomy   . Deaf   . Cancer ([place]) 1999    breast-s/p mastectomy, chemo, rad  . Breast cancer ([place])   . Abnormal Pap smear     Past Surgical History  Procedure Laterality Date  . Mastectomy    . Ovarian cyst removal     . Mastectomy    . Tubal ligation      No prescriptions prior to admission   Allergies  Allergen Reactions  . Promethazine Hcl     REACTION: itchy and red  . Diphenhydramine Hcl Itching and Rash    Social History  Substance Use Topics  . Smoking status: Never Smoker   . Smokeless tobacco: Never Used  . Alcohol Use: No    Family History  Problem Relation Age of Onset  . Hypertension Mother   . Seizures Mother   . Alzheimer's disease Maternal Uncle   . Pancreatitis Maternal Grandmother   . Heart attack Maternal Grandfather     Review of Systems Per HPI  Objective:  Based on 5/25 visit: BP 130/63 mmHg  Pulse 75  Temp(Src) 97.5 F (36.4 C) (Oral)  Wt 138 lb (62.596 kg) Gen: NAD, pleasant, cooperative Neuro: grossly nonfocal, signs fluently with sign language interpreter Extremities: no gross deformity to either leg. Marked antalgic gait. Pain with any movement of left leg/hip (passive or active). Strength exam limited by pain in hip/groin/thigh area but strength seems overall preserved. Sensation intact over bilateral lower extremities. Foot plantarflexion & dorsiflexion intact on L. 2+ dp pulse on L.  Assessment:   50 yo F with L hip & thigh pain, most suggestive of pathology of hip joint given pain with passive & active motion of L hip. With history of breast cancer at a young age, ruling out metastatic illness is important. Xrays were unremarkable. Unfortunately she reports she will not be able to tolerate  MRI unless she is sedated.   Plan:   -MRI L hip with & without contrast under sedation.  Chrisandra Netters, MD [place]

## 2015-06-25 ENCOUNTER — Telehealth: Payer: Self-pay | Admitting: Oncology

## 2015-06-25 ENCOUNTER — Ambulatory Visit (HOSPITAL_COMMUNITY)
Admission: RE | Admit: 2015-06-25 | Discharge: 2015-06-25 | Disposition: A | Discharge status: Home / Self Care | Payer: Medicaid Other | Source: Ambulatory Visit | Attending: Family Medicine | Admitting: Family Medicine

## 2015-06-25 ENCOUNTER — Telehealth: Payer: Self-pay | Admitting: *Deleted

## 2015-06-25 ENCOUNTER — Inpatient Hospital Stay (HOSPITAL_COMMUNITY): Payer: Medicaid Other

## 2015-06-25 ENCOUNTER — Encounter (HOSPITAL_COMMUNITY)
Admission: RE | Disposition: A | Discharge status: Home Health | Payer: Self-pay | Source: Ambulatory Visit | Attending: Family Medicine

## 2015-06-25 ENCOUNTER — Ambulatory Visit
Admit: 2015-06-25 | Discharge: 2015-06-25 | Disposition: A | Discharge status: Home / Self Care | Payer: Medicaid Other | Attending: Radiation Oncology | Admitting: Radiation Oncology

## 2015-06-25 ENCOUNTER — Encounter (HOSPITAL_COMMUNITY): Payer: Self-pay | Admitting: Certified Registered Nurse Anesthetist

## 2015-06-25 ENCOUNTER — Ambulatory Visit (HOSPITAL_COMMUNITY): Payer: Medicaid Other | Admitting: Certified Registered Nurse Anesthetist

## 2015-06-25 ENCOUNTER — Inpatient Hospital Stay (HOSPITAL_COMMUNITY)
Admission: RE | Admit: 2015-06-25 | Discharge: 2015-07-02 | DRG: 479 | Disposition: A | Discharge status: Home Health | Payer: Medicaid Other | Source: Ambulatory Visit | Attending: Family Medicine | Admitting: Family Medicine

## 2015-06-25 DIAGNOSIS — Z6821 Body mass index (BMI) 21.0-21.9, adult: Secondary | ICD-10-CM

## 2015-06-25 DIAGNOSIS — N946 Dysmenorrhea, unspecified: Secondary | ICD-10-CM

## 2015-06-25 DIAGNOSIS — M25552 Pain in left hip: Secondary | ICD-10-CM | POA: Insufficient documentation

## 2015-06-25 DIAGNOSIS — T40605A Adverse effect of unspecified narcotics, initial encounter: Secondary | ICD-10-CM | POA: Diagnosis not present

## 2015-06-25 DIAGNOSIS — R630 Anorexia: Secondary | ICD-10-CM | POA: Diagnosis present

## 2015-06-25 DIAGNOSIS — E876 Hypokalemia: Secondary | ICD-10-CM | POA: Diagnosis not present

## 2015-06-25 DIAGNOSIS — T458X1A Poisoning by other primarily systemic and hematological agents, accidental (unintentional), initial encounter: Secondary | ICD-10-CM | POA: Diagnosis not present

## 2015-06-25 DIAGNOSIS — D649 Anemia, unspecified: Secondary | ICD-10-CM

## 2015-06-25 DIAGNOSIS — Z853 Personal history of malignant neoplasm of breast: Secondary | ICD-10-CM | POA: Diagnosis not present

## 2015-06-25 DIAGNOSIS — F411 Generalized anxiety disorder: Secondary | ICD-10-CM | POA: Insufficient documentation

## 2015-06-25 DIAGNOSIS — Z90722 Acquired absence of ovaries, bilateral: Secondary | ICD-10-CM

## 2015-06-25 DIAGNOSIS — F329 Major depressive disorder, single episode, unspecified: Secondary | ICD-10-CM | POA: Insufficient documentation

## 2015-06-25 DIAGNOSIS — K5903 Drug induced constipation: Secondary | ICD-10-CM | POA: Insufficient documentation

## 2015-06-25 DIAGNOSIS — Z9049 Acquired absence of other specified parts of digestive tract: Secondary | ICD-10-CM | POA: Diagnosis not present

## 2015-06-25 DIAGNOSIS — Z923 Personal history of irradiation: Secondary | ICD-10-CM | POA: Diagnosis not present

## 2015-06-25 DIAGNOSIS — Z87410 Personal history of cervical dysplasia: Secondary | ICD-10-CM

## 2015-06-25 DIAGNOSIS — C50919 Malignant neoplasm of unspecified site of unspecified female breast: Secondary | ICD-10-CM

## 2015-06-25 DIAGNOSIS — T50991A Poisoning by other drugs, medicaments and biological substances, accidental (unintentional), initial encounter: Secondary | ICD-10-CM | POA: Diagnosis not present

## 2015-06-25 DIAGNOSIS — Y92230 Patient room in hospital as the place of occurrence of the external cause: Secondary | ICD-10-CM | POA: Diagnosis not present

## 2015-06-25 DIAGNOSIS — F32A Depression, unspecified: Secondary | ICD-10-CM | POA: Insufficient documentation

## 2015-06-25 DIAGNOSIS — M8580 Other specified disorders of bone density and structure, unspecified site: Secondary | ICD-10-CM | POA: Diagnosis present

## 2015-06-25 DIAGNOSIS — C799 Secondary malignant neoplasm of unspecified site: Secondary | ICD-10-CM

## 2015-06-25 DIAGNOSIS — C7951 Secondary malignant neoplasm of bone: Secondary | ICD-10-CM | POA: Diagnosis present

## 2015-06-25 DIAGNOSIS — Z170 Estrogen receptor positive status [ER+]: Secondary | ICD-10-CM | POA: Diagnosis not present

## 2015-06-25 DIAGNOSIS — Z888 Allergy status to other drugs, medicaments and biological substances status: Secondary | ICD-10-CM | POA: Diagnosis not present

## 2015-06-25 DIAGNOSIS — C50911 Malignant neoplasm of unspecified site of right female breast: Secondary | ICD-10-CM | POA: Diagnosis present

## 2015-06-25 DIAGNOSIS — C419 Malignant neoplasm of bone and articular cartilage, unspecified: Secondary | ICD-10-CM

## 2015-06-25 DIAGNOSIS — K219 Gastro-esophageal reflux disease without esophagitis: Secondary | ICD-10-CM | POA: Diagnosis present

## 2015-06-25 DIAGNOSIS — H8110 Benign paroxysmal vertigo, unspecified ear: Secondary | ICD-10-CM

## 2015-06-25 DIAGNOSIS — C801 Malignant (primary) neoplasm, unspecified: Secondary | ICD-10-CM | POA: Diagnosis not present

## 2015-06-25 DIAGNOSIS — D638 Anemia in other chronic diseases classified elsewhere: Secondary | ICD-10-CM | POA: Diagnosis present

## 2015-06-25 DIAGNOSIS — H905 Unspecified sensorineural hearing loss: Secondary | ICD-10-CM | POA: Diagnosis present

## 2015-06-25 DIAGNOSIS — Z9011 Acquired absence of right breast and nipple: Secondary | ICD-10-CM

## 2015-06-25 DIAGNOSIS — Z9221 Personal history of antineoplastic chemotherapy: Secondary | ICD-10-CM

## 2015-06-25 DIAGNOSIS — E041 Nontoxic single thyroid nodule: Secondary | ICD-10-CM | POA: Diagnosis present

## 2015-06-25 DIAGNOSIS — F419 Anxiety disorder, unspecified: Secondary | ICD-10-CM | POA: Diagnosis present

## 2015-06-25 DIAGNOSIS — Z419 Encounter for procedure for purposes other than remedying health state, unspecified: Secondary | ICD-10-CM | POA: Diagnosis not present

## 2015-06-25 HISTORY — DX: Anxiety disorder, unspecified: F41.9

## 2015-06-25 HISTORY — PX: RADIOLOGY WITH ANESTHESIA: SHX6223

## 2015-06-25 HISTORY — DX: Major depressive disorder, single episode, unspecified: F32.9

## 2015-06-25 HISTORY — DX: Depression, unspecified: F32.A

## 2015-06-25 LAB — CBC
HEMATOCRIT: 32.4 % — AB (ref 36.0–46.0)
HEMOGLOBIN: 10.7 g/dL — AB (ref 12.0–15.0)
MCH: 28.3 pg (ref 26.0–34.0)
MCHC: 33 g/dL (ref 30.0–36.0)
MCV: 85.7 fL (ref 78.0–100.0)
PLATELETS: 246 10*3/uL (ref 150–400)
RBC: 3.78 MIL/uL — ABNORMAL LOW (ref 3.87–5.11)
RDW: 12.1 % (ref 11.5–15.5)
WBC: 5 10*3/uL (ref 4.0–10.5)

## 2015-06-25 LAB — COMPREHENSIVE METABOLIC PANEL
ALBUMIN: 3.2 g/dL — AB (ref 3.5–5.0)
ALT: 11 U/L — AB (ref 14–54)
AST: 16 U/L (ref 15–41)
Alkaline Phosphatase: 74 U/L (ref 38–126)
Anion gap: 8 (ref 5–15)
BUN: 8 mg/dL (ref 6–20)
CHLORIDE: 104 mmol/L (ref 101–111)
CO2: 27 mmol/L (ref 22–32)
CREATININE: 0.87 mg/dL (ref 0.44–1.00)
Calcium: 9.3 mg/dL (ref 8.9–10.3)
GFR calc non Af Amer: >60 mL/min (ref >60)
GLUCOSE: 84 mg/dL (ref 65–99)
Potassium: 3.5 mmol/L (ref 3.5–5.1)
SODIUM: 139 mmol/L (ref 135–145)
Total Bilirubin: 1.1 mg/dL (ref 0.3–1.2)
Total Protein: 6.5 g/dL (ref 6.5–8.1)

## 2015-06-25 LAB — SURGICAL PCR SCREEN
MRSA, PCR: NEGATIVE
STAPHYLOCOCCUS AUREUS: POSITIVE — AB

## 2015-06-25 LAB — TYPE AND SCREEN
ABO/RH(D): O POS
ANTIBODY SCREEN: NEGATIVE

## 2015-06-25 LAB — ABO/RH: ABO/RH(D): O POS

## 2015-06-25 SURGERY — RADIOLOGY WITH ANESTHESIA
Anesthesia: General | Site: Hip | Laterality: Left

## 2015-06-25 MED ORDER — LORAZEPAM 2 MG/ML IJ SOLN
1.0000 mg | INTRAMUSCULAR | Status: DC | PRN
  Administered 2015-06-26 – 2015-06-29 (×3): 1 mg via INTRAVENOUS
  Filled 2015-06-25 (×4): qty 1

## 2015-06-25 MED ORDER — TRAMADOL HCL 50 MG PO TABS: 50.0000 mg | ORAL_TABLET | Freq: Three times a day (TID) | ORAL | Status: DC | PRN

## 2015-06-25 MED ORDER — SODIUM CHLORIDE 0.9 % IV SOLN: 250.0000 mL | INTRAVENOUS | Status: DC | PRN

## 2015-06-25 MED ORDER — LACTATED RINGERS IV SOLN
INTRAVENOUS | Status: DC
  Administered 2015-06-25 – 2015-06-26 (×2): via INTRAVENOUS

## 2015-06-25 MED ORDER — ENOXAPARIN SODIUM 40 MG/0.4ML ~~LOC~~ SOLN
40.0000 mg | SUBCUTANEOUS | Status: DC
  Administered 2015-06-25: 40 mg via SUBCUTANEOUS
  Filled 2015-06-25: qty 0.4

## 2015-06-25 MED ORDER — FAMOTIDINE 10 MG PO TABS
10.0000 mg | ORAL_TABLET | Freq: Two times a day (BID) | ORAL | Status: DC
  Administered 2015-06-25 – 2015-07-02 (×12): 10 mg via ORAL
  Filled 2015-06-25 (×15): qty 1

## 2015-06-25 MED ORDER — METOCLOPRAMIDE HCL 5 MG/ML IJ SOLN
5.0000 mg | Freq: Four times a day (QID) | INTRAMUSCULAR | Status: DC | PRN
  Administered 2015-06-25 – 2015-06-28 (×3): 5 mg via INTRAVENOUS
  Filled 2015-06-25 (×7): qty 2

## 2015-06-25 MED ORDER — SODIUM CHLORIDE 0.9% FLUSH
3.0000 mL | Freq: Two times a day (BID) | INTRAVENOUS | Status: DC
  Administered 2015-06-25 – 2015-07-01 (×6): 3 mL via INTRAVENOUS

## 2015-06-25 MED ORDER — HYDROCODONE-ACETAMINOPHEN 5-325 MG PO TABS
1.0000 | ORAL_TABLET | ORAL | Status: DC | PRN
  Administered 2015-06-25 – 2015-06-26 (×2): 2 via ORAL
  Filled 2015-06-25 (×3): qty 2

## 2015-06-25 MED ORDER — DOCUSATE SODIUM 100 MG PO CAPS: 100.0000 mg | ORAL_CAPSULE | Freq: Two times a day (BID) | ORAL | Status: DC

## 2015-06-25 MED ORDER — SODIUM CHLORIDE 0.9% FLUSH: 3.0000 mL | INTRAVENOUS | Status: DC | PRN

## 2015-06-25 MED ORDER — GI COCKTAIL ~~LOC~~
30.0000 mL | Freq: Once | ORAL | Status: AC
  Administered 2015-06-25: 30 mL via ORAL
  Filled 2015-06-25: qty 30

## 2015-06-25 MED ORDER — GADOBENATE DIMEGLUMINE 529 MG/ML IV SOLN
15.0000 mL | Freq: Once | INTRAVENOUS | Status: AC
  Administered 2015-06-25: 13 mL via INTRAVENOUS

## 2015-06-25 MED ORDER — FENTANYL CITRATE (PF) 100 MCG/2ML IJ SOLN: 25.0000 ug | INTRAMUSCULAR | Status: DC | PRN

## 2015-06-25 MED ORDER — METOCLOPRAMIDE HCL 5 MG PO TABS
5.0000 mg | ORAL_TABLET | Freq: Four times a day (QID) | ORAL | Status: DC | PRN
  Administered 2015-07-02: 5 mg via ORAL
  Filled 2015-06-25: qty 1

## 2015-06-25 NOTE — H&P (Signed)
[facility] Admission History and Physical Service Pager: [phone]  Patient name: [name] Medical record number: [id number] Date of birth: 04-08-1965 Age: 50 y.o. Gender: female  Primary Care Provider: Vance Gather, MD Consultants: Orthopedic Surgery, Oncology, Radiation Oncology Code Status: Full  Chief Complaint: L hip pain  Assessment and Plan: [name] is a 50 y.o. female presenting with widespread metastatic disease. PMH is significant for hx breast cancer s/p R sided mastectomy/chemo/radiation in 1999, congenital deafness, depression, anxiety.   Widespread Bony Metastatic Disease of the pelvis/femurs with hx of breast cancer: Pt was diagnosed with T1N1 right breast carcinoma ER/PR+ and HER 2+ in 1999, treated with right mastectomy and chemotherapy. Pt underwent MRI of left hip today for evaluation of left hip pain. MRI showed widespread bony metastatic disease with numerous foci of tumor throughout the bony pelvis and femurs.  - Admit to inpatient, attending Dr. Gwendlyn Deutscher. - Her oncologist, Dr. Marko Plume is aware of this MRI finding and recommends admission with inpatient consultation to orthopedic surgery and radiation oncology. Dr Marko Plume to see the patient this afternoon  . - Radiation oncology consulted. Will see her today or tomorrow. - Orthopedic surgery consulted, appreciate recommendations. - Non-weight bearing for now. - Norco 5-325 (1-2 tablets) every 4 hours prn and Tramadol 29m every 8 hours prn for pain - Vitals per unit routine   Heartburn: Takes TUMS at home. Endorsing heartburn on admission. - Pepcid 149mbid - GI cocktail  Hx Depression and Anxiety: Not on any medications at home. Pt states she is overwhelmed by everything that has happened today. - Ativan 27m56m4hrs prn for anxiety. - Will consider starting SSRI this admission - Consider palliative care consult or chaplain after oncologist discussion. She understandably  does not wish to talk spiritual care or SW at this time as she digests this very difficult news - will continue to provide any emotional support as needed  Normocytic Anemia: Hgb 10.7 from 11.8 2 weeks ago. May be secondary to anemia of chronic disease. - Will check a CBC in the morning.  FEN/GI: Regular diet, SLIV Prophylaxis: Lovenox  Disposition: Admit to med-surg, attending Dr. EniGwendlyn DeutscherHistory of Present Illness: History obtained with sign language interpreter present    [name] a [age]o. female presenting with widespread bony metastatic disease of the pelvis/femurs seen on MRI of L hip this morning. She has been having L hip pain for the last 3 months. She has also been  limping with walking for the last few months. She  endorses fatigue, loss of appetite, vomiting, and ~7lb weight loss recently. She states she has had pain all throughout her body more recently, but the most intense pain is in the left hip. She does not know how long she has had generalized pain for. She states she is overwhelmed by this diagnosis.   She has a history of T1N1 right breast carcinoma in 1999 that was ER/PR+ and HER2+. She was treated with right mastectomy and chemotherapy. She has not had any problems with her cancer since then.  She had hip x-rays ordered by her PCP, which were negative. She was seen in the [facility] on 5/28. CT abdomen/pelvis showed no acute abnormality except for 6mm32mnspecific hypodensity in the liver that has changed only minimally since 2015. She was sent home with PCP follow-up.  Review Of Systems: Per HPI with the following additions: No fevers, no chills, no chest pain, no SOB. +heartburn.  Otherwise the remainder of the systems were negative.  Patient Active Problem List   Diagnosis Date Noted  . Bony metastasis ([place]) 06/25/2015  . Breast cancer ([place]) 06/25/2015  . Genetic testing 04/04/2015  . Congenital deafness 02/26/2015  . Noncompliance with  therapeutic plan 02/26/2015  . Hx of BSO (bilateral salpingo-oophorectomy) 02/26/2015  . Premature surgical menopause 02/26/2015  . Benign paroxysmal positional vertigo 07/24/2014  . Migraine 07/24/2014  . RLQ abdominal pain 09/30/2013  . Dysuria 09/30/2013  . Hx of breast cancer 04/28/2013  . Colon cancer screening 04/28/2013  . Migraine without aura, with intractable migraine, so stated, without mention of status migrainosus 06/22/2012  . Dizziness 05/14/2011  . Pharyngitis, acute 09/04/2010  . BACK STRAIN, LUMBAR 01/21/2010  . DE QUERVAIN'S TENOSYNOVITIS, LEFT WRIST 10/07/2009  . EMESIS 02/05/2009  . ABDOMINAL PAIN, GENERALIZED 02/05/2009  . DIZZINESS, CHRONIC 01/25/2007  . INSOMNIA 01/25/2007  . Hearing loss 03/11/2006  . CERVICAL DYSPLASIA 03/11/2006  . MENSTRUATION, PAINFUL 03/11/2006    Past Medical History: Past Medical History  Diagnosis Date  . Congenital deafness   . CIN I (cervical intraepithelial neoplasia I) 2003    by colpo  . S/P bilateral oophorectomy   . Deaf   . Cancer ([place]) 1999    breast-s/p mastectomy, chemo, rad  . Breast cancer ([place])   . Abnormal Pap smear   . Depression   . Anxiety     Past Surgical History: Past Surgical History  Procedure Laterality Date  . Mastectomy    . Ovarian cyst removal    . Mastectomy    . Tubal ligation    . Abdominal hysterectomy      Social History: Social History  Substance Use Topics  . Smoking status: Never Smoker   . Smokeless tobacco: Never Used  . Alcohol Use: No   Additional social history: No drug use Please also refer to relevant sections of EMR.  Family History: Family History  Problem Relation Age of Onset  . Hypertension Mother   . Seizures Mother   . Alzheimer's disease Maternal Uncle   . Pancreatitis Maternal Grandmother   . Heart attack Maternal Grandfather     Allergies and Medications: Allergies  Allergen Reactions  . Promethazine Hcl     REACTION: itchy and red  .  Diphenhydramine Hcl Itching and Rash   No current facility-administered medications on file prior to encounter.   Current Outpatient Prescriptions on File Prior to Encounter  Medication Sig Dispense Refill  . HYDROcodone-acetaminophen (NORCO/VICODIN) 5-325 MG tablet Take 1-2 tablets by mouth every 4 (four) hours as needed for moderate pain or severe pain. 20 tablet 0  . traMADol (ULTRAM) 50 MG tablet Take 1 tablet (50 mg total) by mouth every 8 (eight) hours as needed for severe pain. 30 tablet 0  . docusate sodium (COLACE) 100 MG capsule Take 1 capsule (100 mg total) by mouth every 12 (twelve) hours. (Patient not taking: Reported on 06/20/2015) 60 capsule 0    Objective: BP 121/68 mmHg  Pulse 78  Temp(Src) 97.7 F (36.5 C) (Oral)  Resp 17  Ht _0  (1.702 m)  Wt 136 lb 9 oz (61.944 kg)  BMI 21.38 kg/m2  SpO2 99% Exam: General: Tired-appearing, tearful, communicating with sign language (interpreter present) HEENT: [place]/AT, EOMI, MMM Neck: Supple Cardiovascular: RRR, no murmurs Respiratory: CTAB, normal work of breathing Abdomen: Well-healed horizontal abdominal scar that extends transversely across the entire lower abdomen, +BS, soft, non-distended, diffusely tender to palpation (tenderness worse over the  scar), no rebound, no guarding, no masses MSK: No edema or cyanosis Skin: Scar on abdomen as above, no rashes Neuro: Awake, alert, oriented, CN 2-12 grossly intact, no focal deficits Psych: Tearful affect, appropriate behavior  Labs and Imaging: CBC BMET   Recent Labs Lab 06/25/15 1412  WBC 5.0  HGB 10.7*  HCT 32.4*  PLT 246   No results for input(s): NA, K, CL, CO2, BUN, CREATININE, GLUCOSE, CALCIUM in the last 168 hours.   MRI L hip (6/13): Widespread bony metastatic disease with numerous foci of tumor throughout the bony pelvis and femurs. Confluent tumor in the left proximal femoral intertrochanteric region in shaft, where the medullary space is completely replaced by  tumor over a 15 cm excursion, and where there some anterior bony cortical destruction, periosteal reaction, and possibly early periosteal extension of tumor along the proximal tibial metaphysis and shaft especially anteriorly. Low-level edema in the adjacent vastus intermedius and iliopsoas musculature in this area. In general the various tumor deposits are occult and/or highly inconspicuous on CT ; given the extensive tumor in the bony pelvis and hips, I would expect similar widespread osseous metastatic disease elsewhere in the axial and appendicular skeleton. Further workup with whole-body bone scan, nuclear medicine PET-CT, and/or MRI workup may be warranted.  [name], MD 06/25/2015, 2:51 PM PGY-1, [facility] Intern pager: [phone], text pages welcome   I have seen and examined the patient. I have read and agree with the above note. My changes are noted in blue.  Jahvier Aldea A. Lincoln Brigham MD, [facility] Resident PGY-2 Pager [phone]

## 2015-06-25 NOTE — Telephone Encounter (Signed)
Late entry: results reviewed by me shortly after radiology read finalized. Patient was still in the PACU, recovering from MR with sedation. I spoke with her oncologist Dr. Marko Plume, who recommends admission to the hospital for radiation oncology & orthopedic consultation. We will admit her to [facility] for now, with plans to potentially transfer to [facility] for radiation therapy. Patient must be nonweightbearing until otherwise instructed as she is at high risk for pathological fracture given the extensive bony metastatic disease. Dr. Marko Plume is planning to see [name] at [facility] this afternoon. I went to the PACU department and discussed results with patient and her husband, using an in-person sign language interpreter. They were appropriately surprised and saddened at this news. Admission has been arranged to room 6N06. Informed inpatient resident team as well as inpatient attending Dr. Gwendlyn Deutscher. Temporary admission orders placed.  Debra Rio, MD

## 2015-06-25 NOTE — Anesthesia Preprocedure Evaluation (Addendum)
Anesthesia Evaluation  Patient identified by MRN, date of birth, ID band Patient awake  General Assessment Comment:History noted. CE  Reviewed: Allergy & Precautions, NPO status , Patient's Chart, lab work & pertinent test results  Airway Mallampati: II   Neck ROM: Full    Dental   Pulmonary neg pulmonary ROS,    breath sounds clear to auscultation       Cardiovascular negative cardio ROS   Rhythm:Regular     Neuro/Psych    GI/Hepatic negative GI ROS, Neg liver ROS,   Endo/Other  negative endocrine ROS  Renal/GU negative Renal ROS     Musculoskeletal   Abdominal   Peds  Hematology   Anesthesia Other Findings   Reproductive/Obstetrics                            Anesthesia Physical Anesthesia Plan  ASA: II  Anesthesia Plan: General   Post-op Pain Management:    Induction: Intravenous  Airway Management Planned:   Additional Equipment:   Intra-op Plan:   Post-operative Plan: Extubation in OR  Informed Consent: I have reviewed the patients History and Physical, chart, labs and discussed the procedure including the risks, benefits and alternatives for the proposed anesthesia with the patient or authorized representative who has indicated his/her understanding and acceptance.   Dental advisory given  Plan Discussed with: CRNA and Anesthesiologist  Anesthesia Plan Comments:         Anesthesia Quick Evaluation

## 2015-06-25 NOTE — Progress Notes (Signed)
Pt admitted to 6N06 via bed from PACU.  Pt AAO X 4.  Pt on RA.  Pt has 18G to [facility] with fluids infusing.  Pt has deaf interpreter to bedside.  Pt requesting something to drink and something for a headache.  Explained to pt I would check her order and if she could have something, I would bring her something to drink.  Will continue to monitor.

## 2015-06-25 NOTE — Progress Notes (Signed)
Report given to davis rn as  caregiver

## 2015-06-25 NOTE — Progress Notes (Signed)
Patient had MRI of hip done today under sedation for compl;aint of hip pain. eport was faxed to [place] and as preceptor I have co-ordintaed notifying PCP who is Dr. Bonner Puna and ordering physician, Dr. Ardelia Mems. Dr. Dolores Patty was already aware of results. She will coordinate further notifications and has reached out to patients oncologist this morning.

## 2015-06-25 NOTE — Transfer of Care (Signed)
Immediate Anesthesia Transfer of Care Note  Patient: [name]  Procedure(s) Performed: Procedure(s) with comments: MRI OF LEFT HIP WITH OR WITHOUT CONTRAST (Left) - DR. MCINTYRE/MRI  Patient Location: PACU  Anesthesia Type:General  Level of Consciousness: awake and alert   Airway & Oxygen Therapy: Patient Spontanous Breathing and Patient connected to nasal cannula oxygen  Post-op Assessment: Report given to RN and Post -op Vital signs reviewed and stable  Post vital signs: Reviewed and stable  Last Vitals:  Filed Vitals:   06/25/15 0722 06/25/15 1050  BP: 121/69 127/75  Pulse: 76 85  Temp: 36.6 C   Resp: 18 11    Last Pain:  Filed Vitals:   06/25/15 1057  PainSc: 0-No pain         Complications: No apparent anesthesia complications

## 2015-06-25 NOTE — Anesthesia Postprocedure Evaluation (Signed)
Anesthesia Post Note  Patient: [name]  Procedure(s) Performed: Procedure(s) (LRB): MRI OF LEFT HIP WITH OR WITHOUT CONTRAST (Left)  Patient location during evaluation: PACU Anesthesia Type: General Level of consciousness: awake Pain management: pain level controlled Vital Signs Assessment: post-procedure vital signs reviewed and stable Respiratory status: spontaneous breathing Cardiovascular status: stable Anesthetic complications: no    Last Vitals:  Filed Vitals:   06/25/15 1100 06/25/15 1115  BP: 116/70 107/68  Pulse: 77 72  Temp:    Resp: 14 13    Last Pain:  Filed Vitals:   06/25/15 1122  PainSc: 0-No pain                 EDWARDS,Frankie Scipio

## 2015-06-25 NOTE — Consult Note (Addendum)
Reason for Consult:pathologic metastatic disease left femur Referring Physician:Eniola MD   [name] is an 50 y.o. female.  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 Original Dx 1999.    Past Medical History  Diagnosis Date  . Congenital deafness   . CIN I (cervical intraepithelial neoplasia I) 2003    by colpo  . S/P bilateral oophorectomy   . Deaf   . Cancer ([place]) 1999    breast-s/p mastectomy, chemo, rad  . Breast cancer ([place])   . Abnormal Pap smear   . Depression   . Anxiety     Past Surgical History  Procedure Laterality Date  . Ovarian cyst removal    . Tubal ligation    . Abdominal hysterectomy    . Mastectomy      right breast  . Mastectomy      Family History  Problem Relation Age of Onset  . Hypertension Mother   . Seizures Mother   . Alzheimer's disease Maternal Uncle   . Pancreatitis Maternal Grandmother   . Heart attack Maternal Grandfather     Social History:  reports that she has never smoked. She has never used smokeless tobacco. She reports that she does not drink alcohol or use illicit drugs.  Allergies:  Allergies  Allergen Reactions  . Promethazine Hcl     REACTION: itchy and red  . Diphenhydramine Hcl Itching and Rash    Medications: I have reviewed the patient's current medications.  Results for orders placed or performed during the hospital encounter of 06/25/15 (from the past 48 hour(s))  CBC     Status: Abnormal   Collection Time: 06/25/15  2:12 PM  Result Value Ref Range   WBC 5.0 4.0 - 10.5 K/uL   RBC 3.78 (L) 3.87 - 5.11 MIL/uL   Hemoglobin 10.7 (L) 12.0 - 15.0 g/dL   HCT 32.4 (L) 36.0 - 46.0 %   MCV 85.7 78.0 - 100.0 fL   MCH 28.3 26.0 - 34.0 pg   MCHC 33.0 30.0 - 36.0 g/dL   RDW 12.1 11.5 - 15.5 %   Platelets 246 150 - 400 K/uL  Comprehensive metabolic panel     Status: Abnormal   Collection Time: 06/25/15  2:12 PM  Result Value Ref Range   Sodium 139 135 -  145 mmol/L   Potassium 3.5 3.5 - 5.1 mmol/L   Chloride 104 101 - 111 mmol/L   CO2 27 22 - 32 mmol/L   Glucose, Bld 84 65 - 99 mg/dL   BUN 8 6 - 20 mg/dL   Creatinine, Ser 0.87 0.44 - 1.00 mg/dL   Calcium 9.3 8.9 - 10.3 mg/dL   Total Protein 6.5 6.5 - 8.1 g/dL   Albumin 3.2 (L) 3.5 - 5.0 g/dL   AST 16 15 - 41 U/L   ALT 11 (L) 14 - 54 U/L   Alkaline Phosphatase 74 38 - 126 U/L   Total Bilirubin 1.1 0.3 - 1.2 mg/dL   GFR calc non Af Amer >60 >60 mL/min   GFR calc Af Amer >60 >60 mL/min    Comment: (NOTE) The eGFR has been calculated using the CKD EPI equation. This calculation has not been validated in all clinical situations. eGFR's persistently <60 mL/min signify possible Chronic Kidney Disease.    Anion gap 8 5 - 15    Mr Hip Left W Wo Contrast  06/25/2015  CLINICAL DATA:  Left hip pain  for 3 months. History of breast cancer EXAM: MRI OF THE LEFT HIP WITHOUT AND WITH CONTRAST TECHNIQUE: Multiplanar, multisequence MR imaging was performed both before and after administration of intravenous contrast. CONTRAST:  22m MULTIHANCE GADOBENATE DIMEGLUMINE 529 MG/ML IV SOLN COMPARISON:  06/10/2015 FINDINGS: Please note that in order to encompasses much of the osseous metastatic disease is possible, large field of view images were employed even on the sagittal images, a deviation from the normal orthopedic a protocol. This covers more volume but does inevitably result in reduced spatial resolution. Bones: Widespread osseous metastatic disease throughout the visualized pelvis. Index right posterior acetabular wall lesion 2.3 by 1.1 cm on image 9 series 5. There is particularly extensive tumor in the left proximal femoral neck, intertrochanteric region, and especially the proximal 15 cm of the shaft in which the medullary space it is replaced by tumor. There is periostitis and potentially early periosteal spread of tumor specially anteriorly in the proximal femoral metaphysis where there is moth-eaten  endosteal scalloping and probable mild cortical breakthrough. Low-level edema and adjacent anterior musculature in this vicinity. My opinion is that structural integrity of the proximal femur is likely compromised, with increased risk of fracture. There is considerable tumor in the left anterior acetabular wall. Multiple foci of tumor present in the right proximal femur, the largest measuring 1.6 cm in the right femoral shaft on image 16/5. These osseous metastatic lesions are surprisingly difficult to visualize on the CT scan. Articular cartilage and labrum Articular cartilage: Grossly intact, reduced sensitivity due to reduce spatial resolution. Labrum: No obvious paralabral cyst, sensitivity for labral tear is significantly reduced by the large field of view employed. Joint or bursal effusion Joint effusion:  Absent Bursae:  No regional bursitis Muscles and tendons Muscles and tendons: Aside from the low-level edema along the left iliopsoas and vastus intermedius musculature proximally due to tumor involvement of the cortex and periosteum of the left proximal femur, muscles and tendons appear unremarkable. Other findings Miscellaneous: No pathologic adenopathy in the pelvis is identified. There are a few sigmoid colon diverticula. IMPRESSION: 1. Unfortunately there is widespread bony metastatic disease with numerous foci of tumor throughout the bony pelvis and femurs. Confluent tumor in the left proximal femoral intertrochanteric region in shaft, where the medullary space is completely replaced by tumor over a 15 cm excursion, and where there some anterior bony cortical destruction, periosteal reaction, and possibly early periosteal extension of tumor along the proximal tibial metaphysis and shaft especially anteriorly. Low-level edema in the adjacent vastus intermedius and iliopsoas musculature in this area. In general the various tumor deposits are occult and/or highly inconspicuous on CT ; given the extensive  tumor in the bony pelvis and hips, I would expect similar widespread osseous metastatic disease elsewhere in the axial and appendicular skeleton. Further workup with whole-body bone scan, nuclear medicine PET-CT, and/or MRI workup may be warranted. These results will be called to the ordering clinician or representative by the Radiologist Assistant, and communication documented in the PACS or zVision Dashboard. Electronically Signed   By: WVan ClinesM.D.   On: 06/25/2015 11:17    Review of Systems  Constitutional: Negative for fever, chills and malaise/fatigue.  Cardiovascular: Negative for chest pain and orthopnea.  Gastrointestinal: Negative.   Musculoskeletal: Positive for myalgias and joint pain.  Neurological:       Pt deaf. Chart review OF ROS.    Blood pressure 112/60, pulse 79, temperature 98.2 F (36.8 C), temperature source Oral, resp. rate 16, height  _0  (1.702 m), weight 61.944 kg (136 lb 9 oz), SpO2 98 %. Physical Exam  Constitutional: She is oriented to person, place, and time. She appears well-developed and well-nourished.  HENT:  deaf  Neck: Normal range of motion. Neck supple.  Cardiovascular: Normal rate.   Respiratory: Effort normal.  GI: Soft.  Musculoskeletal:  Pain with left hip ROM  Neurological: She is alert and oriented to person, place, and time.  Skin: Skin is warm and dry.  Psychiatric: She has a normal mood and affect. Thought content normal.    Assessment/Plan: Discussed through interpreter planned surgery tomorrow about 4 PM for stabilization of left proximal femur pathologic lesion with cortical erosion. Will placed long troch nail to stabilize entire femur with interlocks proximal and distal. Risks discussed all ?'s answered. Clear lq breakfast then NPO for surgery Wed afternoon about 4 PM   Will Hold Lovenox after todays dose and restart after surgery.   Lakira Ogando C 06/25/2015, 6:34 PM

## 2015-06-25 NOTE — Telephone Encounter (Signed)
Call report from [facility] Radiology.  Report printed and given to Preceptor Dr. Nori Riis.  Will forward to PCP.  Derl Barrow, RN

## 2015-06-25 NOTE — Anesthesia Procedure Notes (Signed)
Procedure Name: LMA Insertion Date/Time: 06/25/2015 9:14 AM Performed by: Ollen Bowl Pre-anesthesia Checklist: Patient identified, Emergency Drugs available, Suction available, Patient being monitored and Timeout performed Patient Re-evaluated:Patient Re-evaluated prior to inductionOxygen Delivery Method: Circle system utilized and Simple face mask Preoxygenation: Pre-oxygenation with 100% oxygen Intubation Type: IV induction Ventilation: Mask ventilation without difficulty LMA: LMA inserted LMA Size: 4.0 Number of attempts: 1 Airway Equipment and Method: Patient positioned with wedge pillow Placement Confirmation: positive ETCO2 and breath sounds checked- equal and bilateral Tube secured with: Tape Dental Injury: Teeth and Oropharynx as per pre-operative assessment

## 2015-06-25 NOTE — Telephone Encounter (Signed)
Medical Oncology  Consult called to radiation oncology, to be seen inpatient at [facility] today or possibly 6-14. Rad onc aware that sign language interpreter needed.  Thank you  L.Marko Plume, MD

## 2015-06-25 NOTE — Telephone Encounter (Signed)
Medical Oncology  Notified by Dr Ardelia Mems of MRI left hip results, imaging showing extensive bone metastatic disease, at risk for fracture including left femoral neck "occult or highly inconspicuous on CT" per radiologist.  Patient thought to still be in [facility] PACU, as scan was done with sedation.  She needs to be admitted for additional workup, kept nonweight bearing, rad onc and orthopedics consults, and I will see for med onc. (PET would be ideal imaging but unable to get this inpatient).  Dr Ardelia Mems to arrange admission hopefully directly from PACU to [facility].   L.Livesay MD

## 2015-06-25 NOTE — Telephone Encounter (Signed)
Extremely unfortunate MRI results reviewed. Spoke with Dr. Nori Riis who will discuss these findings with the ordering physician, Dr. Ardelia Mems.

## 2015-06-25 NOTE — Progress Notes (Signed)
MEDICAL ONCOLOGY   Reason for Consult: MRI evidence of cancer extensively involving left hip and pelvis Referring Physician: [facility] day 1 Antibiotics none   Outpatient Physicians: Chrisandra Netters ([place]), C.Harraway Darden Dates (neurology), L.Martavia Tye  NOTE patient is deaf. Sign language interpreter's office [phone] to schedule interpreter.  Situation discussed directly with Dr Ardelia Mems after results of MRI available earlier today.  Patient is seen now, together with husband, all of visit assisted by sign language interpreter.   [name] is an 50 y.o. female with history of right breast cancer at age 30 not previously known recurrent, recently with new onset left hip pain. Plain xrays left hip 06-06-15 and CT AP 06-10-15 did not show obvious etiology. CMET 06-09-15 had normal alk phos, calcium and protein/ albumin. She had MRI left hip done earlier today, which shows apparent extensive bony metastatic disease thru all of visualized pelvis, right acetabulum, left proximal femur, right proximal femur. Per radiologist " these osseous metastatic lesions are surprisingly difficult tovisualize on the CT scan."  Due to impending pathologic fracture at least in left femur, patient was directly admitted from PACU where she was recovering from sedation required for the MRI scan.   Patient reports ~ 3 months of progressive discomfort LLQ abdomen and left hip, evaluation as above during that time.  I had followed her in past for medical oncology, recently seeing her 02-2015 for the first time since 05-2012. At that time she was complaining of pain left lateral breast, was overdue mammograms by 3 years, had not had genetics counseling or bone density scan (premature surgical menopause), but no complaints of hip or abdominal pain. She was seen back at [facility] last week, with pain out of proportion to negative findings on recent CT and plain xrays of hip, today's MRI  pending. Patient reports various pain neck, upper arms and across chest last week, which subsequently resolved. She denies bleeding, SOB, cough. Appetite was good until today, when N/V after sedation for MRI and anxiety about these findings; bowels fine. No LE swelling. Present IV left antecubital uncomfortable when she signs. Anxious about invasive procedures.  Other labs today notable for hemoglobin down to 10.7, this having been 11.8 on 06-09-15. WBC not elevated, platelets 246.   Addendum: following my visit, Dr [name] has seen for orthopedics, plans stabilization of left proximal femur nail tomorrow afternoon. She has some other pain complaints and has not had imaging of right femur, lower legs, chest, C spine, humerii. Plain Xrays ordered now portable. WILL NEED SURGICAL PATH SPECIMEN SENT PLEASE  ROS As above  ONCOLOGIC HISTORY Patient had 1.4 cm poorly differentiated right breast carcinoma diagnosed in Nov 1999 at age 56, 1/7 axillary nodes involved, ER/PR and HER 2 positive. She had mastectomy with the 7 axillary node evaluation, 4 cycles of adriamycin/ cytoxan followed by taxotere, then five years of tamoxifen thru June 2005 (no herceptin in 1999). She had right TRAM reconstruction. She had bilateral salpingoophorectomy in May 2005. She was briefly on aromatase inhibitor after tamoxifen, but discontinued this herself due to poor tolerance. Left mammogram was done as screening on 04-15-2012 with heterogeneously dense tissue and questioned asymmetry, followed by diagnostic left mamogram on 04-28-12 which had no findings of concern; she had no further mammograms until 03-15-2015 tomo left at [facility] with heterogeneously dense breast tissue but no other findings of concern. Genetics testing was negative by Breast Ovarian panel 03-2015.   Allergies:  Allergies  Allergen Reactions  .  Promethazine Hcl     REACTION: itchy and red  . Diphenhydramine Hcl Itching and Rash   Past Medical History   Diagnosis Date  . Congenital deafness   . CIN I (cervical intraepithelial neoplasia I) 2003    by colpo  . S/P bilateral oophorectomy   . Deaf   . Cancer ([place]) 1999    breast-s/p mastectomy, chemo, rad  . Breast cancer ([place])   . Abnormal Pap smear   . Depression   . Anxiety     Past Surgical History  Procedure Laterality Date  . Ovarian cyst removal    . Tubal ligation    . Abdominal hysterectomy    . Mastectomy      right breast  . Mastectomy      Family History  Problem Relation Age of Onset  . Hypertension Mother   . Seizures Mother   . Alzheimer's disease Maternal Uncle   . Pancreatitis Maternal Grandmother   . Heart attack Maternal Grandfather     Social History:  reports that she has never smoked. She has never used smokeless tobacco. She reports that she does not drink alcohol or use illicit drugs.  Medications: reviewed as listed in EMR  Blood pressure 121/68, pulse 78, temperature 97.7 F (36.5 C), temperature source Oral, resp. rate 17, height 5' 7"  (1.702 m), weight 136 lb 9 oz (61.944 kg), SpO2 99 %. Physical Exam  Awake, alert, denies pain seated in bed at 60 degrees. Respirations not labored RA. Normal hair pattern. PERRL, not icteric. Oral mucosa clear and moist. NSL left antecubital. No cervical or supraclavicular adenopathy. Breast exam not repeated with interpreter present, unremarkable right TRAM unremarkable left breast at my exam last week.  Heart RRR no gallop. Peripheral pulses symmetrical and intact. Lungs clear anteriorly. Abdomen soft, not tender including LLQ on exam now. LE no edema, cords, tenderness. Feet warm. No skin rash. Moves all extremities. Signs quickly and easily with both hands, CN appear intact.      Results for orders placed or performed during the hospital encounter of 06/25/15 (from the past 48 hour(s))  CBC     Status: Abnormal   Collection Time: 06/25/15  2:12 PM  Result Value Ref Range   WBC 5.0 4.0 - 10.5 K/uL   RBC  3.78 (L) 3.87 - 5.11 MIL/uL   Hemoglobin 10.7 (L) 12.0 - 15.0 g/dL   HCT 32.4 (L) 36.0 - 46.0 %   MCV 85.7 78.0 - 100.0 fL   MCH 28.3 26.0 - 34.0 pg   MCHC 33.0 30.0 - 36.0 g/dL   RDW 12.1 11.5 - 15.5 %   Platelets 246 150 - 400 K/uL  Comprehensive metabolic panel     Status: Abnormal   Collection Time: 06/25/15  2:12 PM  Result Value Ref Range   Sodium 139 135 - 145 mmol/L   Potassium 3.5 3.5 - 5.1 mmol/L   Chloride 104 101 - 111 mmol/L   CO2 27 22 - 32 mmol/L   Glucose, Bld 84 65 - 99 mg/dL   BUN 8 6 - 20 mg/dL   Creatinine, Ser 0.87 0.44 - 1.00 mg/dL   Calcium 9.3 8.9 - 10.3 mg/dL   Total Protein 6.5 6.5 - 8.1 g/dL   Albumin 3.2 (L) 3.5 - 5.0 g/dL   AST 16 15 - 41 U/L   ALT 11 (L) 14 - 54 U/L   Alkaline Phosphatase 74 38 - 126 U/L   Total Bilirubin 1.1 0.3 - 1.2  mg/dL   GFR calc non Af Amer >60 >60 mL/min   GFR calc Af Amer >60 >60 mL/min    Comment: (NOTE) The eGFR has been calculated using the CKD EPI equation. This calculation has not been validated in all clinical situations. eGFR's persistently <60 mL/min signify possible Chronic Kidney Disease.    Anion gap 8 5 - 15   CEA, CA 2729 and CA 15-3 ordered.   RADIOLOGY EXAM: DG HIP (WITH OR WITHOUT PELVIS) 2-3V LEFT  06-06-15  COMPARISON: None.  FINDINGS: No significant degenerative change of the left hip is seen with relatively well preserved left hip joint space. The left pelvic ramus is intact. The left SI joint appears corticated.  IMPRESSION: Negative    EXAM: CT ABDOMEN AND PELVIS WITH CONTRAST  06-10-15  TECHNIQUE: Multidetector CT imaging of the abdomen and pelvis was performed using the standard protocol following bolus administration of intravenous contrast.  CONTRAST: 100 mL ISOVUE-300 IOPAMIDOL (ISOVUE-300) INJECTION 61%  COMPARISON: CT of the abdomen and pelvis from 09/25/2013, and abdominal ultrasound performed 05/09/2005  FINDINGS: Minimal bibasilar atelectasis is  noted.  A 6 mm hypodensity is noted within the liver. A few small calcified granulomata are noted within the spleen. The liver and spleen are otherwise unremarkable. The mildly heterogeneous appearance of the spleen likely reflects the phase of contrast enhancement. The patient is status post cholecystectomy, with clips noted at the gallbladder fossa. The pancreas and adrenal glands are unremarkable.  A 1.1 cm left renal cyst is noted. The kidneys are otherwise unremarkable. There is no evidence of hydronephrosis. No renal or ureteral stones are seen. No perinephric stranding is appreciated.  No free fluid is identified. The small bowel is unremarkable in appearance. The stomach is within normal limits. No acute vascular abnormalities are seen.  The appendix is normal in caliber, without evidence of appendicitis. The colon is grossly unremarkable in appearance.  The bladder is mildly distended and grossly unremarkable. The uterus is unremarkable in appearance. The ovaries are grossly symmetric. No suspicious adnexal masses are seen. No inguinal lymphadenopathy is seen.  No acute osseous abnormalities are identified.  IMPRESSION: 1. No acute abnormality seen within the abdomen or pelvis. 2. Small left renal cyst noted. 3. 6 mm nonspecific hypodensity within the liver has changed only minimally in size from 2015 and is likely benign.    EXAM: MRI OF THE LEFT HIP WITHOUT AND WITH CONTRAST 06-25-15 TECHNIQUE: Multiplanar, multisequence MR imaging was performed both before and after administration of intravenous contrast.  CONTRAST: 10m MULTIHANCE GADOBENATE DIMEGLUMINE 529 MG/ML IV SOLN  COMPARISON: 06/10/2015  FINDINGS: Please note that in order to encompasses much of the osseous metastatic disease is possible, large field of view images were employed even on the sagittal images, a deviation from the normal orthopedic a protocol. This covers more volume but  does inevitably result in reduced spatial resolution.  Bones: Widespread osseous metastatic disease throughout the visualized pelvis. Index right posterior acetabular wall lesion 2.3 by 1.1 cm on image 9 series 5.  There is particularly extensive tumor in the left proximal femoral neck, intertrochanteric region, and especially the proximal 15 cm of the shaft in which the medullary space it is replaced by tumor. There is periostitis and potentially early periosteal spread of tumor specially anteriorly in the proximal femoral metaphysis where there is moth-eaten endosteal scalloping and probable mild cortical breakthrough. Low-level edema and adjacent anterior musculature in this vicinity. My opinion is that structural integrity of the proximal femur is  likely compromised, with increased risk of fracture.  There is considerable tumor in the left anterior acetabular wall. Multiple foci of tumor present in the right proximal femur, the largest measuring 1.6 cm in the right femoral shaft on image 16/5. These osseous metastatic lesions are surprisingly difficult to visualize on the CT scan.  Articular cartilage and labrum  Articular cartilage: Grossly intact, reduced sensitivity due to reduce spatial resolution.  Labrum: No obvious paralabral cyst, sensitivity for labral tear is significantly reduced by the large field of view employed.  Joint or bursal effusion  Joint effusion: Absent  Bursae: No regional bursitis  Muscles and tendons  Muscles and tendons: Aside from the low-level edema along the left iliopsoas and vastus intermedius musculature proximally due to tumor involvement of the cortex and periosteum of the left proximal femur, muscles and tendons appear unremarkable.  Other findings  Miscellaneous: No pathologic adenopathy in the pelvis is identified. There are a few sigmoid colon diverticula.  IMPRESSION: 1. Unfortunately there is widespread  bony metastatic disease with numerous foci of tumor throughout the bony pelvis and femurs. Confluent tumor in the left proximal femoral intertrochanteric region in shaft, where the medullary space is completely replaced by tumor over a 15 cm excursion, and where there some anterior bony cortical destruction, periosteal reaction, and possibly early periosteal extension of tumor along the proximal tibial metaphysis and shaft especially anteriorly. Low-level edema in the adjacent vastus intermedius and iliopsoas musculature in this area. In general the various tumor deposits are occult and/or highly inconspicuous on CT ; given the extensive tumor in the bony pelvis and hips, I would expect similar widespread osseous metastatic disease elsewhere in the axial and appendicular skeleton. Further workup with whole-body bone scan, nuclear medicine PET-CT, and/or MRI workup may be warranted.    DISCUSSION With assistance of sign language interpreter, we have discussed findings on  MRI left hip today which appear to be metastatic disease to bone, not identified on recent CT or plain Xrays, which is certainly unusual. While this is most likely related to her previous breast cancer, that was 17+ years ago and will certainly need path confirmation to direct treatment beyond acute interventions. Hopefully surgical path can be sent with orthopedic procedure now planned for 06-26-15 (tho bone biopsies can take several weeks to process, prefer soft tissue also if possible). She needs additional staging, optimally PET, which will have to be done outpatient; for now with surgery planned 6-14, I have ordered portable films of long bones, C spine and CXR, tho note even plain films left hip did not obviously show involvement. She needs to remain nonweight bearing until left femur is stabilized. I have requested radiation oncology consult also.  I have explained to patient that we will all work together to make full  diagnosis and formulate rest of plan from there.  She and husband have had opportunity to ask all questions.   Assessment/Plan: 1.MRI evidence of extensive bone involvement of left hip and pelvis, likely metastatic cancer: impending pathologic fracture left femur. Appreciate Dr [name] seeing her, plan surgery left femur 06-26-15. NEED SURGICAL PATH INCLUDING SOFT TISSUE IF POSSIBLE.  I have ordered additional portable xrays now, as this information may be helpful with surgery tomorrow 2.right breast cancer T1 N1 (1 of 7 nodes) ER PR + and HER 2 + in 1999 at age 74. Treated with mastectomy/TRAM reconstruction and 7 node right axillary evaluation, adriamycin cytoxan x 4 followed by taxol, 5 years of tamoxifen. She also had  oophorectomy, was intolerant of aromatase inhibitor. BRCA testing negative 03-2015. This breast cancer has not been known recurrent to this point [place] by orthopedics - thank you. 3.I am not clear that she became menopausal with chemo, but did have oophorectomy 05-2003, which was done for complaints of severe abdominal pain. Bone density scan 03-14-15 osteopenia, lowest T score -1.8 4.decrease in hemoglobin over last few weeks, now mildly anemic. May reflect bone marrow involvement, but as not been evaluated further  5.congenital deafness. Husband also deaf and communicates by signing. 6.positional vertigo followed by neurology. I believe she does not tolerate lying on stomach (as for breast MRI) 7.post laparoscopic cholecystectomy 2004  Time spent > 1 hr  Thank you, will follow . Please page me if needed between my rounds.  Pager [place] June 25, 2015, 5:43 PM

## 2015-06-26 ENCOUNTER — Encounter (HOSPITAL_COMMUNITY): Payer: Self-pay | Admitting: Surgery

## 2015-06-26 ENCOUNTER — Inpatient Hospital Stay (HOSPITAL_COMMUNITY): Payer: Medicaid Other | Admitting: Anesthesiology

## 2015-06-26 ENCOUNTER — Encounter (HOSPITAL_COMMUNITY)
Admission: RE | Disposition: A | Discharge status: Home Health | Payer: Self-pay | Source: Ambulatory Visit | Attending: Family Medicine

## 2015-06-26 ENCOUNTER — Inpatient Hospital Stay (HOSPITAL_COMMUNITY): Payer: Medicaid Other

## 2015-06-26 DIAGNOSIS — F32A Depression, unspecified: Secondary | ICD-10-CM | POA: Insufficient documentation

## 2015-06-26 DIAGNOSIS — K219 Gastro-esophageal reflux disease without esophagitis: Secondary | ICD-10-CM | POA: Insufficient documentation

## 2015-06-26 DIAGNOSIS — F411 Generalized anxiety disorder: Secondary | ICD-10-CM

## 2015-06-26 DIAGNOSIS — F329 Major depressive disorder, single episode, unspecified: Secondary | ICD-10-CM

## 2015-06-26 HISTORY — PX: INTRAMEDULLARY (IM) NAIL INTERTROCHANTERIC: SHX5875

## 2015-06-26 LAB — CBC
HCT: 32.3 % — ABNORMAL LOW (ref 36.0–46.0)
Hemoglobin: 10.5 g/dL — ABNORMAL LOW (ref 12.0–15.0)
MCH: 28.4 pg (ref 26.0–34.0)
MCHC: 32.5 g/dL (ref 30.0–36.0)
MCV: 87.3 fL (ref 78.0–100.0)
Platelets: 259 10*3/uL (ref 150–400)
RBC: 3.7 MIL/uL — ABNORMAL LOW (ref 3.87–5.11)
RDW: 12.2 % (ref 11.5–15.5)
WBC: 5.2 10*3/uL (ref 4.0–10.5)

## 2015-06-26 LAB — COMPREHENSIVE METABOLIC PANEL WITH GFR
ALT: 11 U/L — ABNORMAL LOW (ref 14–54)
AST: 13 U/L — ABNORMAL LOW (ref 15–41)
Albumin: 3.2 g/dL — ABNORMAL LOW (ref 3.5–5.0)
Alkaline Phosphatase: 73 U/L (ref 38–126)
Anion gap: 7 (ref 5–15)
BUN: 9 mg/dL (ref 6–20)
CO2: 29 mmol/L (ref 22–32)
Calcium: 9.3 mg/dL (ref 8.9–10.3)
Chloride: 101 mmol/L (ref 101–111)
Creatinine, Ser: 0.94 mg/dL (ref 0.44–1.00)
GFR calc Af Amer: >60 mL/min
GFR calc non Af Amer: >60 mL/min
Glucose, Bld: 97 mg/dL (ref 65–99)
Potassium: 4 mmol/L (ref 3.5–5.1)
Sodium: 137 mmol/L (ref 135–145)
Total Bilirubin: 1.1 mg/dL (ref 0.3–1.2)
Total Protein: 6.7 g/dL (ref 6.5–8.1)

## 2015-06-26 SURGERY — FIXATION, FRACTURE, INTERTROCHANTERIC, WITH INTRAMEDULLARY ROD
Anesthesia: General | Laterality: Left

## 2015-06-26 MED ORDER — ONDANSETRON HCL 4 MG/2ML IJ SOLN: 4.0000 mg | Freq: Four times a day (QID) | INTRAMUSCULAR | Status: DC | PRN

## 2015-06-26 MED ORDER — ACETAMINOPHEN 650 MG RE SUPP: 650.0000 mg | Freq: Four times a day (QID) | RECTAL | Status: DC | PRN

## 2015-06-26 MED ORDER — POLYETHYLENE GLYCOL 3350 17 G PO PACK: 17.0000 g | PACK | Freq: Every day | ORAL | Status: DC | PRN

## 2015-06-26 MED ORDER — OXYCODONE HCL 5 MG PO TABS
5.0000 mg | ORAL_TABLET | Freq: Four times a day (QID) | ORAL | Status: DC | PRN
  Administered 2015-06-26 – 2015-06-30 (×6): 10 mg via ORAL
  Filled 2015-06-26 (×6): qty 2

## 2015-06-26 MED ORDER — DOCUSATE SODIUM 100 MG PO CAPS
100.0000 mg | ORAL_CAPSULE | Freq: Two times a day (BID) | ORAL | Status: DC
  Administered 2015-06-27 – 2015-06-28 (×4): 100 mg via ORAL
  Filled 2015-06-26 (×5): qty 1

## 2015-06-26 MED ORDER — BUPIVACAINE-EPINEPHRINE 0.5% -1:200000 IJ SOLN
INTRAMUSCULAR | Status: DC | PRN
  Administered 2015-06-26: 20 mL

## 2015-06-26 MED ORDER — PROPOFOL 10 MG/ML IV BOLUS
INTRAVENOUS | Status: AC
  Filled 2015-06-26: qty 20

## 2015-06-26 MED ORDER — ONDANSETRON HCL 4 MG/2ML IJ SOLN
INTRAMUSCULAR | Status: DC | PRN
  Administered 2015-06-26: 4 mg via INTRAVENOUS

## 2015-06-26 MED ORDER — PHENOL 1.4 % MT LIQD
1.0000 | OROMUCOSAL | Status: DC | PRN
  Administered 2015-06-26: 1 via OROMUCOSAL
  Filled 2015-06-26: qty 177

## 2015-06-26 MED ORDER — MENTHOL 3 MG MT LOZG: 1.0000 | LOZENGE | OROMUCOSAL | Status: DC | PRN

## 2015-06-26 MED ORDER — NEOSTIGMINE METHYLSULFATE 5 MG/5ML IV SOSY
PREFILLED_SYRINGE | INTRAVENOUS | Status: AC
  Filled 2015-06-26: qty 5

## 2015-06-26 MED ORDER — LIDOCAINE 2% (20 MG/ML) 5 ML SYRINGE
INTRAMUSCULAR | Status: AC
  Filled 2015-06-26: qty 5

## 2015-06-26 MED ORDER — LACTATED RINGERS IV SOLN: INTRAVENOUS | Status: DC

## 2015-06-26 MED ORDER — HYDROMORPHONE HCL 1 MG/ML IJ SOLN
INTRAMUSCULAR | Status: AC
  Filled 2015-06-26: qty 1

## 2015-06-26 MED ORDER — ONDANSETRON HCL 4 MG/2ML IJ SOLN
INTRAMUSCULAR | Status: AC
  Filled 2015-06-26: qty 2

## 2015-06-26 MED ORDER — ROCURONIUM BROMIDE 100 MG/10ML IV SOLN
INTRAVENOUS | Status: DC | PRN
  Administered 2015-06-26: 40 mg via INTRAVENOUS

## 2015-06-26 MED ORDER — OXYCODONE HCL 5 MG/5ML PO SOLN: 5.0000 mg | Freq: Once | ORAL | Status: DC | PRN

## 2015-06-26 MED ORDER — DEXTROSE 5 % IV SOLN
10.0000 mg | INTRAVENOUS | Status: DC | PRN
  Administered 2015-06-26: 10 ug/min via INTRAVENOUS

## 2015-06-26 MED ORDER — EPHEDRINE SULFATE 50 MG/ML IJ SOLN
INTRAMUSCULAR | Status: DC | PRN
  Administered 2015-06-26 (×3): 5 mg via INTRAVENOUS

## 2015-06-26 MED ORDER — HYDROMORPHONE HCL 1 MG/ML IJ SOLN
0.5000 mg | INTRAMUSCULAR | Status: DC | PRN
  Administered 2015-06-27 – 2015-06-29 (×6): 1 mg via INTRAVENOUS
  Filled 2015-06-26 (×7): qty 1

## 2015-06-26 MED ORDER — SODIUM CHLORIDE 0.9 % IV SOLN
INTRAVENOUS | Status: DC
  Administered 2015-06-26 – 2015-06-29 (×5): via INTRAVENOUS

## 2015-06-26 MED ORDER — ASPIRIN EC 325 MG PO TBEC
325.0000 mg | DELAYED_RELEASE_TABLET | Freq: Every day | ORAL | Status: DC
  Administered 2015-06-27 – 2015-07-02 (×6): 325 mg via ORAL
  Filled 2015-06-26 (×6): qty 1

## 2015-06-26 MED ORDER — LIDOCAINE HCL (CARDIAC) 20 MG/ML IV SOLN
INTRAVENOUS | Status: DC | PRN
  Administered 2015-06-26: 60 mg via INTRAVENOUS

## 2015-06-26 MED ORDER — 0.9 % SODIUM CHLORIDE (POUR BTL) OPTIME
TOPICAL | Status: DC | PRN
  Administered 2015-06-26: 1000 mL

## 2015-06-26 MED ORDER — FENTANYL CITRATE (PF) 250 MCG/5ML IJ SOLN
INTRAMUSCULAR | Status: AC
  Filled 2015-06-26: qty 5

## 2015-06-26 MED ORDER — ONDANSETRON HCL 4 MG/2ML IJ SOLN
4.0000 mg | Freq: Four times a day (QID) | INTRAMUSCULAR | Status: DC | PRN
  Filled 2015-06-26 (×3): qty 2

## 2015-06-26 MED ORDER — METOCLOPRAMIDE HCL 5 MG/ML IJ SOLN
5.0000 mg | Freq: Three times a day (TID) | INTRAMUSCULAR | Status: DC | PRN
  Administered 2015-06-28 – 2015-06-30 (×4): 10 mg via INTRAVENOUS
  Filled 2015-06-26 (×2): qty 2

## 2015-06-26 MED ORDER — DEXAMETHASONE SODIUM PHOSPHATE 10 MG/ML IJ SOLN
INTRAMUSCULAR | Status: DC | PRN
  Administered 2015-06-26: 5 mg via INTRAVENOUS

## 2015-06-26 MED ORDER — CEFAZOLIN SODIUM-DEXTROSE 2-4 GM/100ML-% IV SOLN
INTRAVENOUS | Status: AC
  Filled 2015-06-26: qty 100

## 2015-06-26 MED ORDER — METHOCARBAMOL 500 MG PO TABS
500.0000 mg | ORAL_TABLET | Freq: Four times a day (QID) | ORAL | Status: DC | PRN
  Administered 2015-06-26 – 2015-06-28 (×3): 500 mg via ORAL
  Filled 2015-06-26 (×3): qty 1

## 2015-06-26 MED ORDER — GLYCOPYRROLATE 0.2 MG/ML IV SOSY
PREFILLED_SYRINGE | INTRAVENOUS | Status: AC
  Filled 2015-06-26: qty 3

## 2015-06-26 MED ORDER — METHOCARBAMOL 1000 MG/10ML IJ SOLN
500.0000 mg | Freq: Four times a day (QID) | INTRAVENOUS | Status: DC | PRN
  Filled 2015-06-26 (×2): qty 5

## 2015-06-26 MED ORDER — PROPOFOL 10 MG/ML IV BOLUS
INTRAVENOUS | Status: DC | PRN
  Administered 2015-06-26: 160 mg via INTRAVENOUS

## 2015-06-26 MED ORDER — DEXAMETHASONE SODIUM PHOSPHATE 10 MG/ML IJ SOLN
INTRAMUSCULAR | Status: AC
  Filled 2015-06-26: qty 1

## 2015-06-26 MED ORDER — LACTATED RINGERS IV SOLN
INTRAVENOUS | Status: DC | PRN
  Administered 2015-06-26 (×2): via INTRAVENOUS

## 2015-06-26 MED ORDER — CEFAZOLIN SODIUM-DEXTROSE 2-4 GM/100ML-% IV SOLN
2.0000 g | Freq: Three times a day (TID) | INTRAVENOUS | Status: AC
  Administered 2015-06-26 – 2015-06-27 (×2): 2 g via INTRAVENOUS
  Filled 2015-06-26 (×2): qty 100

## 2015-06-26 MED ORDER — MIDAZOLAM HCL 2 MG/2ML IJ SOLN
INTRAMUSCULAR | Status: AC
  Filled 2015-06-26: qty 2

## 2015-06-26 MED ORDER — OXYCODONE HCL 5 MG PO TABS: 5.0000 mg | ORAL_TABLET | Freq: Once | ORAL | Status: DC | PRN

## 2015-06-26 MED ORDER — ACETAMINOPHEN 325 MG PO TABS: 650.0000 mg | ORAL_TABLET | Freq: Four times a day (QID) | ORAL | Status: DC | PRN

## 2015-06-26 MED ORDER — ROCURONIUM BROMIDE 50 MG/5ML IV SOLN
INTRAVENOUS | Status: AC
  Filled 2015-06-26: qty 1

## 2015-06-26 MED ORDER — METOCLOPRAMIDE HCL 5 MG PO TABS: 5.0000 mg | ORAL_TABLET | Freq: Three times a day (TID) | ORAL | Status: DC | PRN

## 2015-06-26 MED ORDER — CHLORHEXIDINE GLUCONATE CLOTH 2 % EX PADS
6.0000 | MEDICATED_PAD | Freq: Every day | CUTANEOUS | Status: AC
  Administered 2015-06-26 – 2015-06-30 (×5): 6 via TOPICAL

## 2015-06-26 MED ORDER — FENTANYL CITRATE (PF) 100 MCG/2ML IJ SOLN
INTRAMUSCULAR | Status: DC | PRN
  Administered 2015-06-26: 100 ug via INTRAVENOUS
  Administered 2015-06-26 (×2): 50 ug via INTRAVENOUS

## 2015-06-26 MED ORDER — ONDANSETRON HCL 4 MG PO TABS: 4.0000 mg | ORAL_TABLET | Freq: Four times a day (QID) | ORAL | Status: DC | PRN

## 2015-06-26 MED ORDER — MUPIROCIN 2 % EX OINT
1.0000 "application " | TOPICAL_OINTMENT | Freq: Two times a day (BID) | CUTANEOUS | Status: AC
  Administered 2015-06-26 – 2015-06-30 (×9): 1 via NASAL
  Filled 2015-06-26 (×2): qty 22

## 2015-06-26 MED ORDER — BUPIVACAINE-EPINEPHRINE (PF) 0.5% -1:200000 IJ SOLN
INTRAMUSCULAR | Status: AC
  Filled 2015-06-26: qty 30

## 2015-06-26 MED ORDER — HYDROMORPHONE HCL 1 MG/ML IJ SOLN
0.2500 mg | INTRAMUSCULAR | Status: DC | PRN
  Administered 2015-06-26 (×3): 0.5 mg via INTRAVENOUS

## 2015-06-26 MED ORDER — CEFAZOLIN SODIUM-DEXTROSE 2-4 GM/100ML-% IV SOLN
2.0000 g | Freq: Once | INTRAVENOUS | Status: AC
  Administered 2015-06-26: 2 g via INTRAVENOUS

## 2015-06-26 MED FILL — Ondansetron HCl Inj 4 MG/2ML (2 MG/ML): INTRAMUSCULAR | Qty: 2 | Status: AC

## 2015-06-26 MED FILL — Midazolam HCl Inj 2 MG/2ML (Base Equivalent): INTRAMUSCULAR | Qty: 2 | Status: AC

## 2015-06-26 MED FILL — Lactated Ringer's Solution: INTRAVENOUS | Qty: 1000 | Status: AC

## 2015-06-26 MED FILL — Lidocaine HCl IV Inj 20 MG/ML: INTRAVENOUS | Qty: 5 | Status: AC

## 2015-06-26 MED FILL — Ephedrine Sulfate Inj 50 MG/ML: INTRAMUSCULAR | Qty: 1 | Status: AC

## 2015-06-26 MED FILL — Fentanyl Citrate Preservative Free (PF) Inj 100 MCG/2ML: INTRAMUSCULAR | Qty: 2 | Status: AC

## 2015-06-26 SURGICAL SUPPLY — 55 items
BIT DRILL 4.3MMS DISTAL GRDTED (BIT) ×1 IMPLANT
BNDG COHESIVE 4X5 TAN STRL (GAUZE/BANDAGES/DRESSINGS) ×3 IMPLANT
CANISTER SUCTION 2500CC (MISCELLANEOUS) IMPLANT
CONT SPEC 4OZ CLIKSEAL STRL BL (MISCELLANEOUS) ×3 IMPLANT
COVER MAYO STAND STRL (DRAPES) ×3 IMPLANT
COVER PERINEAL POST (MISCELLANEOUS) ×3 IMPLANT
COVER SURGICAL LIGHT HANDLE (MISCELLANEOUS) ×3 IMPLANT
DRAPE C-ARM 42X72 X-RAY (DRAPES) IMPLANT
DRAPE STERI IOBAN 125X83 (DRAPES) ×3 IMPLANT
DRILL 4.3MMS DISTAL GRADUATED (BIT) ×3
DRSG MEPILEX BORDER 4X12 (GAUZE/BANDAGES/DRESSINGS) ×3 IMPLANT
DRSG MEPILEX BORDER 4X4 (GAUZE/BANDAGES/DRESSINGS) ×3 IMPLANT
DRSG PAD ABDOMINAL 8X10 ST (GAUZE/BANDAGES/DRESSINGS) ×3 IMPLANT
DURAPREP 26ML APPLICATOR (WOUND CARE) ×3 IMPLANT
ELECT REM PT RETURN 9FT ADLT (ELECTROSURGICAL) ×3
ELECTRODE REM PT RTRN 9FT ADLT (ELECTROSURGICAL) ×1 IMPLANT
EVACUATOR 1/8 PVC DRAIN (DRAIN) IMPLANT
GAUZE SPONGE 4X4 12PLY STRL (GAUZE/BANDAGES/DRESSINGS) ×3 IMPLANT
GAUZE XEROFORM 1X8 LF (GAUZE/BANDAGES/DRESSINGS) ×3 IMPLANT
GLOVE BIOGEL PI IND STRL 8 (GLOVE) ×2 IMPLANT
GLOVE BIOGEL PI INDICATOR 8 (GLOVE) ×4
GLOVE ORTHO TXT STRL SZ7.5 (GLOVE) ×6 IMPLANT
GOWN STRL REUS W/ TWL LRG LVL3 (GOWN DISPOSABLE) ×1 IMPLANT
GOWN STRL REUS W/TWL 2XL LVL3 (GOWN DISPOSABLE) ×3 IMPLANT
GOWN STRL REUS W/TWL LRG LVL3 (GOWN DISPOSABLE) ×5 IMPLANT
GUIDEPIN 3.2X17.5 THRD DISP (PIN) ×3 IMPLANT
GUIDEWIRE BALL NOSE 100CM (WIRE) ×3 IMPLANT
HIP FRAC NAIL LAG SCR 10.5X100 (Orthopedic Implant) ×2 IMPLANT
KIT BASIN OR (CUSTOM PROCEDURE TRAY) ×3 IMPLANT
KIT ROOM TURNOVER OR (KITS) ×3 IMPLANT
LINER BOOT UNIVERSAL DISP (MISCELLANEOUS) IMPLANT
MANIFOLD NEPTUNE II (INSTRUMENTS) ×3 IMPLANT
NAIL HIP FRA AFFIX 130X9X380 L (Nail) ×3 IMPLANT
NEEDLE HYPO 25GX1X1/2 BEV (NEEDLE) ×3 IMPLANT
NS IRRIG 1000ML POUR BTL (IV SOLUTION) ×3 IMPLANT
PACK GENERAL/GYN (CUSTOM PROCEDURE TRAY) ×3 IMPLANT
PAD ARMBOARD 7.5X6 YLW CONV (MISCELLANEOUS) ×6 IMPLANT
SCREW BONE CORTICAL 5.0X38 (Screw) ×3 IMPLANT
SCREW BONE CORTICAL 5.0X40 (Screw) ×3 IMPLANT
SCREW BONE CORTICAL 5.0X42 (Screw) ×3 IMPLANT
SCREW CANN THRD AFF 10.5X100 (Orthopedic Implant) ×1 IMPLANT
SPONGE LAP 4X18 X RAY DECT (DISPOSABLE) ×3 IMPLANT
STAPLER VISISTAT 35W (STAPLE) ×3 IMPLANT
SUT ETHILON 2 0 FS 18 (SUTURE) ×3 IMPLANT
SUT ETHILON 3 0 PS 1 (SUTURE) ×6 IMPLANT
SUT VIC AB 0 CT1 27 (SUTURE) ×2
SUT VIC AB 0 CT1 27XBRD ANBCTR (SUTURE) ×1 IMPLANT
SUT VIC AB 1 CT1 27 (SUTURE) ×2
SUT VIC AB 1 CT1 27XBRD ANBCTR (SUTURE) ×1 IMPLANT
SUT VIC AB 1 CTX 36 (SUTURE) ×2
SUT VIC AB 1 CTX36XBRD ANBCTR (SUTURE) ×1 IMPLANT
SUT VIC AB 2-0 CT1 27 (SUTURE) ×4
SUT VIC AB 2-0 CT1 TAPERPNT 27 (SUTURE) ×2 IMPLANT
SYR CONTROL 10ML LL (SYRINGE) ×3 IMPLANT
WATER STERILE IRR 1000ML POUR (IV SOLUTION) IMPLANT

## 2015-06-26 NOTE — Interval H&P Note (Signed)
History and Physical Interval Note:  06/26/2015 3:18 PM  [name]  has presented today for surgery, with the diagnosis of Left Femur  The various methods of treatment have been discussed with the patient and family. After consideration of risks, benefits and other options for treatment, the patient has consented to  Procedure(s): LEFT BIOMET LONG AFFIXS NAIL (Left) as a surgical intervention .  The patient's history has been reviewed, patient examined, no change in status, stable for surgery.  I have reviewed the patient's chart and labs.  Questions were answered to the patient's satisfaction.     Adalis Gatti C

## 2015-06-26 NOTE — Transfer of Care (Signed)
Immediate Anesthesia Transfer of Care Note  Patient: [name]  Procedure(s) Performed: Procedure(s): LEFT BIOMET LONG AFFIXS NAIL (Left)  Patient Location: PACU  Anesthesia Type:General  Level of Consciousness: awake, alert  and patient cooperative  Airway & Oxygen Therapy: Patient Spontanous Breathing and Patient connected to nasal cannula oxygen  Post-op Assessment: Report given to RN, Post -op Vital signs reviewed and stable and Patient moving all extremities  Post vital signs: Reviewed and stable  Last Vitals:  Filed Vitals:   06/26/15 0531 06/26/15 1433  BP: 97/55 123/70  Pulse: 64 68  Temp: 36.5 C 36.7 C  Resp: 18 17    Last Pain:  Filed Vitals:   06/26/15 1750  PainSc: Asleep         Complications: No apparent anesthesia complications

## 2015-06-26 NOTE — H&P (View-Only) (Signed)
Reason for Consult:pathologic metastatic disease left femur Referring Physician:Eniola MD   [name] is an 50 y.o. female.  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 Original Dx 1999.    Past Medical History  Diagnosis Date  . Congenital deafness   . CIN I (cervical intraepithelial neoplasia I) 2003    by colpo  . S/P bilateral oophorectomy   . Deaf   . Cancer ([place]) 1999    breast-s/p mastectomy, chemo, rad  . Breast cancer ([place])   . Abnormal Pap smear   . Depression   . Anxiety     Past Surgical History  Procedure Laterality Date  . Ovarian cyst removal    . Tubal ligation    . Abdominal hysterectomy    . Mastectomy      right breast  . Mastectomy      Family History  Problem Relation Age of Onset  . Hypertension Mother   . Seizures Mother   . Alzheimer's disease Maternal Uncle   . Pancreatitis Maternal Grandmother   . Heart attack Maternal Grandfather     Social History:  reports that she has never smoked. She has never used smokeless tobacco. She reports that she does not drink alcohol or use illicit drugs.  Allergies:  Allergies  Allergen Reactions  . Promethazine Hcl     REACTION: itchy and red  . Diphenhydramine Hcl Itching and Rash    Medications: I have reviewed the patient's current medications.  Results for orders placed or performed during the hospital encounter of 06/25/15 (from the past 48 hour(s))  CBC     Status: Abnormal   Collection Time: 06/25/15  2:12 PM  Result Value Ref Range   WBC 5.0 4.0 - 10.5 K/uL   RBC 3.78 (L) 3.87 - 5.11 MIL/uL   Hemoglobin 10.7 (L) 12.0 - 15.0 g/dL   HCT 32.4 (L) 36.0 - 46.0 %   MCV 85.7 78.0 - 100.0 fL   MCH 28.3 26.0 - 34.0 pg   MCHC 33.0 30.0 - 36.0 g/dL   RDW 12.1 11.5 - 15.5 %   Platelets 246 150 - 400 K/uL  Comprehensive metabolic panel     Status: Abnormal   Collection Time: 06/25/15  2:12 PM  Result Value Ref Range   Sodium 139 135 -  145 mmol/L   Potassium 3.5 3.5 - 5.1 mmol/L   Chloride 104 101 - 111 mmol/L   CO2 27 22 - 32 mmol/L   Glucose, Bld 84 65 - 99 mg/dL   BUN 8 6 - 20 mg/dL   Creatinine, Ser 0.87 0.44 - 1.00 mg/dL   Calcium 9.3 8.9 - 10.3 mg/dL   Total Protein 6.5 6.5 - 8.1 g/dL   Albumin 3.2 (L) 3.5 - 5.0 g/dL   AST 16 15 - 41 U/L   ALT 11 (L) 14 - 54 U/L   Alkaline Phosphatase 74 38 - 126 U/L   Total Bilirubin 1.1 0.3 - 1.2 mg/dL   GFR calc non Af Amer >60 >60 mL/min   GFR calc Af Amer >60 >60 mL/min    Comment: (NOTE) The eGFR has been calculated using the CKD EPI equation. This calculation has not been validated in all clinical situations. eGFR's persistently <60 mL/min signify possible Chronic Kidney Disease.    Anion gap 8 5 - 15    Mr Hip Left W Wo Contrast  06/25/2015  CLINICAL DATA:  Left hip pain  for 3 months. History of breast cancer EXAM: MRI OF THE LEFT HIP WITHOUT AND WITH CONTRAST TECHNIQUE: Multiplanar, multisequence MR imaging was performed both before and after administration of intravenous contrast. CONTRAST:  22m MULTIHANCE GADOBENATE DIMEGLUMINE 529 MG/ML IV SOLN COMPARISON:  06/10/2015 FINDINGS: Please note that in order to encompasses much of the osseous metastatic disease is possible, large field of view images were employed even on the sagittal images, a deviation from the normal orthopedic a protocol. This covers more volume but does inevitably result in reduced spatial resolution. Bones: Widespread osseous metastatic disease throughout the visualized pelvis. Index right posterior acetabular wall lesion 2.3 by 1.1 cm on image 9 series 5. There is particularly extensive tumor in the left proximal femoral neck, intertrochanteric region, and especially the proximal 15 cm of the shaft in which the medullary space it is replaced by tumor. There is periostitis and potentially early periosteal spread of tumor specially anteriorly in the proximal femoral metaphysis where there is moth-eaten  endosteal scalloping and probable mild cortical breakthrough. Low-level edema and adjacent anterior musculature in this vicinity. My opinion is that structural integrity of the proximal femur is likely compromised, with increased risk of fracture. There is considerable tumor in the left anterior acetabular wall. Multiple foci of tumor present in the right proximal femur, the largest measuring 1.6 cm in the right femoral shaft on image 16/5. These osseous metastatic lesions are surprisingly difficult to visualize on the CT scan. Articular cartilage and labrum Articular cartilage: Grossly intact, reduced sensitivity due to reduce spatial resolution. Labrum: No obvious paralabral cyst, sensitivity for labral tear is significantly reduced by the large field of view employed. Joint or bursal effusion Joint effusion:  Absent Bursae:  No regional bursitis Muscles and tendons Muscles and tendons: Aside from the low-level edema along the left iliopsoas and vastus intermedius musculature proximally due to tumor involvement of the cortex and periosteum of the left proximal femur, muscles and tendons appear unremarkable. Other findings Miscellaneous: No pathologic adenopathy in the pelvis is identified. There are a few sigmoid colon diverticula. IMPRESSION: 1. Unfortunately there is widespread bony metastatic disease with numerous foci of tumor throughout the bony pelvis and femurs. Confluent tumor in the left proximal femoral intertrochanteric region in shaft, where the medullary space is completely replaced by tumor over a 15 cm excursion, and where there some anterior bony cortical destruction, periosteal reaction, and possibly early periosteal extension of tumor along the proximal tibial metaphysis and shaft especially anteriorly. Low-level edema in the adjacent vastus intermedius and iliopsoas musculature in this area. In general the various tumor deposits are occult and/or highly inconspicuous on CT ; given the extensive  tumor in the bony pelvis and hips, I would expect similar widespread osseous metastatic disease elsewhere in the axial and appendicular skeleton. Further workup with whole-body bone scan, nuclear medicine PET-CT, and/or MRI workup may be warranted. These results will be called to the ordering clinician or representative by the Radiologist Assistant, and communication documented in the PACS or zVision Dashboard. Electronically Signed   By: WVan ClinesM.D.   On: 06/25/2015 11:17    Review of Systems  Constitutional: Negative for fever, chills and malaise/fatigue.  Cardiovascular: Negative for chest pain and orthopnea.  Gastrointestinal: Negative.   Musculoskeletal: Positive for myalgias and joint pain.  Neurological:       Pt deaf. Chart review OF ROS.    Blood pressure 112/60, pulse 79, temperature 98.2 F (36.8 C), temperature source Oral, resp. rate 16, height  _0  (1.702 m), weight 61.944 kg (136 lb 9 oz), SpO2 98 %. Physical Exam  Constitutional: She is oriented to person, place, and time. She appears well-developed and well-nourished.  HENT:  deaf  Neck: Normal range of motion. Neck supple.  Cardiovascular: Normal rate.   Respiratory: Effort normal.  GI: Soft.  Musculoskeletal:  Pain with left hip ROM  Neurological: She is alert and oriented to person, place, and time.  Skin: Skin is warm and dry.  Psychiatric: She has a normal mood and affect. Thought content normal.    Assessment/Plan: Discussed through interpreter planned surgery tomorrow about 4 PM for stabilization of left proximal femur pathologic lesion with cortical erosion. Will placed long troch nail to stabilize entire femur with interlocks proximal and distal. Risks discussed all ?'s answered. Clear lq breakfast then NPO for surgery Wed afternoon about 4 PM   Will Hold Lovenox after todays dose and restart after surgery.   YATES,MARK C 06/25/2015, 6:34 PM

## 2015-06-26 NOTE — Brief Op Note (Signed)
06/25/2015 - 06/26/2015  5:44 PM  PATIENT:  [name]  50 y.o. female  PRE-OPERATIVE DIAGNOSIS:  Left Femur fracture  POST-OPERATIVE DIAGNOSIS:  Left Femur fracture  PROCEDURE:  Procedure(s): LEFT BIOMET LONG AFFIXS NAIL (Left)  SURGEON:  Surgeon(s) and Role:    * [name], MD - Primary  PHYSICIAN ASSISTANT: Benjiman Core pa-c   ANESTHESIA:   general  EBL:  Total I/O In: 1000 [I.V.:1000] Out: 650 [Urine:250; Blood:400]  BLOOD ADMINISTERED:none  DRAINS: none   LOCAL MEDICATIONS USED:  MARCAINE     SPECIMEN:  Biopsy / Limited Resection left femur  DISPOSITION OF SPECIMEN:  PATHOLOGY  COUNTS:  YES  TOURNIQUET:  * No tourniquets in log *  DICTATION: .Dragon Dictation  PLAN OF CARE: Admit to inpatient   PATIENT DISPOSITION:  PACU - hemodynamically stable.

## 2015-06-26 NOTE — Progress Notes (Signed)
Radiation Oncology         [phone]) [phone] ________________________________  Initial Inpatient Consultation  Name: [name] MRN: [id number]  Date: 06/25/2015  DOB: 1965/12/02  [id number] Bonner Puna, MD  Debra Pour, MD   REFERRING PHYSICIAN: Patrecia Pour, MD  DIAGNOSIS: Probable recurrent breast cancer with extensive osseous metastasis (biopsy pending surgery later today)  HISTORY OF PRESENT ILLNESS::[name] is a 50 y.o. female with remote history of node + right breast cancer at age 71.  Patient had 1.4 cm poorly differentiated right breast carcinoma diagnosed in Nov 1999 at age 55, 1/7 axillary nodes involved, ER/PR and HER 2 positive. She had mastectomy with  axillary node evaluation, 4 cycles of adriamycin/ cytoxan followed by taxotere, then five years of tamoxifen thru June 2005 (no herceptin in 1999). She had right TRAM reconstruction. She had bilateral salpingoophorectomy in May 2005. She was briefly on aromatase inhibitor after tamoxifen, but discontinued this herself due to poor tolerance. ( Dr. Mariana [name] intake [name])  She recently presented with severe left hip pain with CT scans showing no obvious problem, however  MRI showing extensive what appears to be osseous metastasis of the left pelvis and left femur with impending pathologic fracture of left femur.  She is scheduled for surgery with Dr. Lorin [name] later today with orthopedic stabilization of entire left femur with intramedullary rod with interlocks.  PREVIOUS RADIATION THERAPY: No  PAST MEDICAL HISTORY:  has a past medical history of Congenital deafness; CIN I (cervical intraepithelial neoplasia I) (2003); S/P bilateral oophorectomy; Deaf; Cancer ([place]) (1999); Breast cancer ([place]); Abnormal Pap smear; Depression; and Anxiety.    PAST SURGICAL HISTORY: Past Surgical History  Procedure Laterality Date  . Ovarian cyst removal    . Tubal ligation    . Abdominal hysterectomy    . Mastectomy      right breast  .  Mastectomy      FAMILY HISTORY: family history includes Alzheimer's disease in her maternal uncle; Heart attack in her maternal grandfather; Hypertension in her mother; Pancreatitis in her maternal grandmother; Seizures in her mother.  SOCIAL HISTORY:  reports that she has never smoked. She has never used smokeless tobacco. She reports that she does not drink alcohol or use illicit drugs.  ALLERGIES: Promethazine hcl and Diphenhydramine hcl  MEDICATIONS:  No current facility-administered medications for this encounter.   No current outpatient prescriptions on file.   Facility-Administered Medications Ordered in Other Encounters  Medication Dose Route Frequency Provider Last Rate Last Dose  . 0.9 %  sodium chloride infusion  250 mL Intravenous PRN Debra A Haney, MD      . 0.9 %  sodium chloride infusion   Intravenous Continuous Debra P Livesay, MD      . Chlorhexidine Gluconate Cloth 2 % PADS 6 each  6 each Topical Daily Debra Feil, MD      . famotidine (PEPCID) tablet 10 mg  10 mg Oral BID Debra Bourbon, MD   10 mg at 06/25/15 1750  . HYDROcodone-acetaminophen (NORCO/VICODIN) 5-325 MG per tablet 1-2 tablet  1-2 tablet Oral Q4H PRN Debra Bourbon, MD   2 tablet at 06/25/15 1445  . lactated ringers infusion   Intravenous Continuous Debra Bud, MD 10 mL/hr at 06/25/15 0751    . LORazepam (ATIVAN) injection 1 mg  1 mg Intravenous Q4H PRN Debra A Haney, MD      . metoCLOPramide (REGLAN) injection 5 mg  5 mg Intravenous Q6H PRN Debra Shams,  DO   5 mg at 06/25/15 2335  . metoCLOPramide (REGLAN) tablet 5 mg  5 mg Oral Q6H PRN Debra Shams, DO      . mupirocin ointment (BACTROBAN) 2 % 1 application  1 application Nasal BID Debra Feil, MD      . sodium chloride flush (NS) 0.9 % injection 3 mL  3 mL Intravenous Q12H Debra Bourbon, MD   3 mL at 06/25/15 2335  . sodium chloride flush (NS) 0.9 % injection 3 mL  3 mL Intravenous PRN Debra A Haney, MD      . traMADol (ULTRAM)  tablet 50 mg  50 mg Oral Q8H PRN Debra Bourbon, MD        REVIEW OF SYSTEMS:  A 15 point review of systems is documented in the electronic medical record.   PHYSICAL EXAM:  Vitals - 1 value per visit [id number]  SYSTOLIC 97  DIASTOLIC 55  Pulse 64  Temperature 97.7  Respirations 18  Weight (lb)   Height   BMI   VISIT REPORT    Patient not seen or examined. Will be seen in post-op setting   LABORATORY DATA:  Lab Results  Component Value Date   WBC 5.2 06/26/2015   HGB 10.5* 06/26/2015   HCT 32.3* 06/26/2015   MCV 87.3 06/26/2015   PLT 259 06/26/2015   NEUTROABS 2.9 03/25/2015   Lab Results  Component Value Date   NA 137 06/26/2015   K 4.0 06/26/2015   CL 101 06/26/2015   CO2 29 06/26/2015   GLUCOSE 97 06/26/2015   CREATININE 0.94 06/26/2015   CALCIUM 9.3 06/26/2015      RADIOGRAPHY: Mr Hip Left W Wo Contrast  06/25/2015  CLINICAL DATA:  Left hip pain for 3 months. History of breast cancer EXAM: MRI OF THE LEFT HIP WITHOUT AND WITH CONTRAST TECHNIQUE: Multiplanar, multisequence MR imaging was performed both before and after administration of intravenous contrast. CONTRAST:  63mL MULTIHANCE GADOBENATE DIMEGLUMINE 529 MG/ML IV SOLN COMPARISON:  06/10/2015 FINDINGS: Please [name] that in order to encompasses much of the osseous metastatic disease is possible, large field of view images were employed even on the sagittal images, a deviation from the normal orthopedic a protocol. This covers more volume but does inevitably result in reduced spatial resolution. Bones: Widespread osseous metastatic disease throughout the visualized pelvis. Index right posterior acetabular wall lesion 2.3 by 1.1 cm on image 9 series 5. There is particularly extensive tumor in the left proximal femoral neck, intertrochanteric region, and especially the proximal 15 cm of the shaft in which the medullary space it is replaced by tumor. There is periostitis and potentially early periosteal spread of tumor  specially anteriorly in the proximal femoral metaphysis where there is moth-eaten endosteal scalloping and probable mild cortical breakthrough. Low-level edema and adjacent anterior musculature in this vicinity. My opinion is that structural integrity of the proximal femur is likely compromised, with increased risk of fracture. There is considerable tumor in the left anterior acetabular wall. Multiple foci of tumor present in the right proximal femur, the largest measuring 1.6 cm in the right femoral shaft on image 16/5. These osseous metastatic lesions are surprisingly difficult to visualize on the CT scan. Articular cartilage and labrum Articular cartilage: Grossly intact, reduced sensitivity due to reduce spatial resolution. Labrum: No obvious paralabral cyst, sensitivity for labral tear is significantly reduced by the large field of view employed. Joint or bursal effusion Joint effusion:  Absent Bursae:  No regional  bursitis Muscles and tendons Muscles and tendons: Aside from the low-level edema along the left iliopsoas and vastus intermedius musculature proximally due to tumor involvement of the cortex and periosteum of the left proximal femur, muscles and tendons appear unremarkable. Other findings Miscellaneous: No pathologic adenopathy in the pelvis is identified. There are a few sigmoid colon diverticula. IMPRESSION: 1. Unfortunately there is widespread bony metastatic disease with numerous foci of tumor throughout the bony pelvis and femurs. Confluent tumor in the left proximal femoral intertrochanteric region in shaft, where the medullary space is completely replaced by tumor over a 15 cm excursion, and where there some anterior bony cortical destruction, periosteal reaction, and possibly early periosteal extension of tumor along the proximal tibial metaphysis and shaft especially anteriorly. Low-level edema in the adjacent vastus intermedius and iliopsoas musculature in this area. In general the various  tumor deposits are occult and/or highly inconspicuous on CT ; given the extensive tumor in the bony pelvis and hips, I would expect similar widespread osseous metastatic disease elsewhere in the axial and appendicular skeleton. Further workup with whole-body bone scan, nuclear medicine PET-CT, and/or MRI workup may be warranted. These results will be called to the ordering clinician or representative by the Radiologist Assistant, and communication documented in the PACS or zVision Dashboard. Electronically Signed   By: Van Clines M.D.   On: 06/25/2015 11:17   Ct Abdomen Pelvis W Contrast  06/10/2015  CLINICAL DATA:  Chronic intermittent left lower quadrant abdominal pain. Personal history of breast cancer. Initial encounter. EXAM: CT ABDOMEN AND PELVIS WITH CONTRAST TECHNIQUE: Multidetector CT imaging of the abdomen and pelvis was performed using the standard protocol following bolus administration of intravenous contrast. CONTRAST:  100 mL ISOVUE-300 IOPAMIDOL (ISOVUE-300) INJECTION 61% COMPARISON:  CT of the abdomen and pelvis from 09/25/2013, and abdominal ultrasound performed 05/09/2005 FINDINGS: Minimal bibasilar atelectasis is noted. A 6 mm hypodensity is noted within the liver. A few small calcified granulomata are noted within the spleen. The liver and spleen are otherwise unremarkable. The mildly heterogeneous appearance of the spleen likely reflects the phase of contrast enhancement. The patient is status post cholecystectomy, with clips noted at the gallbladder fossa. The pancreas and adrenal glands are unremarkable. A 1.1 cm left renal cyst is noted. The kidneys are otherwise unremarkable. There is no evidence of hydronephrosis. No renal or ureteral stones are seen. No perinephric stranding is appreciated. No free fluid is identified. The small bowel is unremarkable in appearance. The stomach is within normal limits. No acute vascular abnormalities are seen. The appendix is normal in caliber,  without evidence of appendicitis. The colon is grossly unremarkable in appearance. The bladder is mildly distended and grossly unremarkable. The uterus is unremarkable in appearance. The ovaries are grossly symmetric. No suspicious adnexal masses are seen. No inguinal lymphadenopathy is seen. No acute osseous abnormalities are identified. IMPRESSION: 1. No acute abnormality seen within the abdomen or pelvis. 2. Small left renal cyst noted. 3. 6 mm nonspecific hypodensity within the liver has changed only minimally in size from 2015 and is likely benign. Electronically Signed   By: Garald Balding M.D.   On: 06/10/2015 02:03   Dg Chest Port 1v Same Day  06/25/2015  CLINICAL DATA:  Left hip pain.  Bone metastasis on MRI. EXAM: PORTABLE CHEST 1 VIEW COMPARISON:  02/18/2008 FINDINGS: Shallow inspiration. Heart size and pulmonary vascularity are normal. Lungs appear clear and expanded. Postoperative changes with right mastectomy and surgical clips in the right axilla. Surgical clips in  the right upper quadrant. Left posterior fourth rib expansile process again demonstrated. Visualized bones appear otherwise intact. Consider bone scan for more sensitive evaluation of bone metastasis. IMPRESSION: No evidence of active pulmonary disease. Possible expansile lesion in the left posterior fourth rib. Electronically Signed   By: Lucienne Capers M.D.   On: 06/25/2015 22:17   Dg Tibia/fibula Left Port  06/25/2015  CLINICAL DATA:  Assess for metastatic disease to the bone. Initial encounter. EXAM: PORTABLE LEFT TIBIA AND FIBULA - 2 VIEW COMPARISON:  None. FINDINGS: A small bone island is noted at the proximal tibia. There is no evidence of metastatic disease to the bone on radiograph. The tibia and fibula appear grossly intact. The knee joint is incompletely assessed, but appears grossly unremarkable. No definite soft tissue abnormalities are characterized on radiograph. IMPRESSION: No evidence of metastatic disease to the  tibia or fibula on radiograph. Electronically Signed   By: Garald Balding M.D.   On: 06/25/2015 22:15   Dg Tibia/fibula Right Port  06/25/2015  CLINICAL DATA:  Assess for metastatic disease to the bone. Initial encounter. EXAM: PORTABLE RIGHT TIBIA AND FIBULA - 2 VIEW COMPARISON:  Right ankle radiographs performed 09/04/2004 FINDINGS: There is no evidence of metastatic disease to the bone. The tibia and fibula appear intact. The ankle joint is grossly unremarkable. The knee joint is incompletely assessed, but appears grossly unremarkable. No definite soft tissue abnormalities are characterized on radiograph. IMPRESSION: No evidence of metastatic disease to the tibia or fibula on radiograph. Electronically Signed   By: Garald Balding M.D.   On: 06/25/2015 22:16   Cerv Spine Port(clearing)  06/25/2015  CLINICAL DATA:  Left hip pain.  Suspected bone metastasis. EXAM: PORTABLE CERVICAL SPINE (CLEARING) COMPARISON:  None. FINDINGS: Limited portable AP and cross-table lateral views of the cervical spine are obtained. Visualized cervical vertebrae demonstrate normal alignment. No compression deformities. Suggestion of lucent infiltrating pattern in the transverse process of T1 on the left possibly indicating metastasis. Focal expansile lesion suggested in the posterior left fourth rib. Consider bone scan for more sensitive evaluation. IMPRESSION: Normal alignment of the visualized cervical spine. Possible metastatic lesions in the left transverse process at T1 and the posterior left fourth rib. Electronically Signed   By: Lucienne Capers M.D.   On: 06/25/2015 22:15   Dg Humerus Left  06/25/2015  CLINICAL DATA:  Left hip pain.  Bone metastases. EXAM: LEFT HUMERUS - 2+ VIEW COMPARISON:  None. FINDINGS: Single View of the left humerus demonstrates no focal bone lesions. IMPRESSION: No focal bone lesions demonstrated on single view left humerus. Electronically Signed   By: Lucienne Capers M.D.   On: 06/25/2015 22:18    Dg Humerus Right  06/25/2015  CLINICAL DATA:  Left hip pain.  Bone metastasis. EXAM: RIGHT HUMERUS - 2+ VIEW COMPARISON:  None. FINDINGS: There is no evidence of fracture or other focal bone lesions demonstrated on single view of the right humerus. Soft tissues are unremarkable. IMPRESSION: No focal bone lesions demonstrated on single view right humerus. Electronically Signed   By: Lucienne Capers M.D.   On: 06/25/2015 22:19   Dg Hip Unilat With Pelvis 2-3 Views Left  06/06/2015  CLINICAL DATA:  Left hip pain, history of breast carcinoma EXAM: DG HIP (WITH OR WITHOUT PELVIS) 2-3V LEFT COMPARISON:  None. FINDINGS: No significant degenerative change of the left hip is seen with relatively well preserved left hip joint space. The left pelvic ramus is intact. The left SI joint appears corticated. IMPRESSION:  Negative. Electronically Signed   By: Ivar Drape M.D.   On: 06/06/2015 15:40   Dg Femur Port, 1v Right  06/25/2015  CLINICAL DATA:  Assess for metastatic disease to the bone. Initial encounter. EXAM: RIGHT FEMUR PORTABLE 1 VIEW COMPARISON:  None. FINDINGS: The right femur is unremarkable in appearance, aside from a small subcortical cyst at the femoral neck. There is no evidence of fracture or dislocation. No lytic or blastic metastases are seen within the visualized osseous structures. The knee joint is grossly unremarkable, though incompletely assessed. The right femoral head remains seated at the acetabulum. Postoperative change is noted at the right hemipelvis. No definite soft tissue abnormalities are characterized on radiograph. IMPRESSION: No evidence of fracture or dislocation. No metastatic bony lesions seen on radiograph. Electronically Signed   By: Garald Balding M.D.   On: 06/25/2015 22:11   Dg Femur Port Min 2 Views Left  06/26/2015  CLINICAL DATA:  Slip and fall 1 month ago with proximal left femur pain, known metastatic disease EXAM: LEFT FEMUR PORTABLE 2 VIEWS COMPARISON:  None.  FINDINGS: No definitive acute fracture or dislocation is noted. There is diffuse multifocal lucencies the within the proximal femur similar to the changes seen on recent MRI consistent with metastatic disease. Some similar changes are noted in the mid femur but to a lesser degree. Knee joint appears within normal limits. IMPRESSION: Widespread metastatic disease throughout the left femur most notable in the proximal femur. Electronically Signed   By: Inez Catalina M.D.   On: 06/26/2015 10:30      IMPRESSION:  Probable recurrent breast cancer with extensive osseous metastasis (biopsy pending surgery later today).  Agree with plans for orthopedic stabilization later today. She will require post-op radiation treatments for tumor control and pain issues.  PLAN:The patient will be seen in the post-op setting either as an inpatient or outpatient to coordinate her radiation therapy.  Anticipate starting ~ 2 weeks post op with ~ 10 treatments planned.   ------------------------------------------------  Blair Promise, PhD, MD

## 2015-06-26 NOTE — Anesthesia Preprocedure Evaluation (Signed)
Anesthesia Evaluation  Patient identified by MRN, date of birth, ID band Patient awake    Reviewed: Allergy & Precautions, NPO status , Patient's Chart, lab work & pertinent test results  Airway Mallampati: II   Neck ROM: full    Dental   Pulmonary neg pulmonary ROS,    breath sounds clear to auscultation       Cardiovascular negative cardio ROS   Rhythm:regular Rate:Normal     Neuro/Psych  Headaches, Anxiety Depression    GI/Hepatic GERD  ,  Endo/Other    Renal/GU      Musculoskeletal   Abdominal   Peds  Hematology   Anesthesia Other Findings   Reproductive/Obstetrics                             Anesthesia Physical Anesthesia Plan  ASA: II  Anesthesia Plan: General   Post-op Pain Management:    Induction: Intravenous  Airway Management Planned: Oral ETT  Additional Equipment:   Intra-op Plan:   Post-operative Plan: Extubation in OR  Informed Consent: I have reviewed the patients History and Physical, chart, labs and discussed the procedure including the risks, benefits and alternatives for the proposed anesthesia with the patient or authorized representative who has indicated his/her understanding and acceptance.     Plan Discussed with: CRNA, Anesthesiologist and Surgeon  Anesthesia Plan Comments:         Anesthesia Quick Evaluation

## 2015-06-26 NOTE — Op Note (Signed)
[name], [name]             ACCOUNT NO.:  [id number]  MEDICAL RECORD NO.:  [id number]  LOCATION:  [facility]                         FACILITY:  [facility]  PHYSICIAN:  Estes Lehner C. [name], M.D.    DATE OF BIRTH:  02/04/1965  DATE OF PROCEDURE: DATE OF DISCHARGE:                              OPERATIVE REPORT   PREOPERATIVE DIAGNOSIS:  Metastatic disease, left proximal femur with cortical thinning.  POSTOPERATIVE DIAGNOSIS:  Metastatic disease, left proximal femur with cortical thinning.  PROCEDURE:  Biopsy of metastatic tissue and stabilization with Affixus trochanteric nail, proximal and distal interlock.  SURGEON:  Abrar Bilton C. [name], M.D.  ASSISTANT:  Benjiman Core, [name], medically necessary and present for the procedure.  IMPLANTS:  A 380 nail, distal screws 38 mm and 42 mm, 100 mm proximal lag screw.  HISTORY OF PRESENT ILLNESS:  This is a 50 year old female, originally diagnosed with breast cancer in 1999, presented with inability to walk and left thigh pain.  She is deaf and uses sign language and writing for communication.  Initially, a CT scan none Jun 09, 1968 did not show any metastatic disease in the pelvis or abdomen.  She continued to have increasing pain which include presentation and admission on June 25, 2015.  MRI scan of the hip showed intramedullary metastasis as well as metastasis within the pelvis on the MRI.  Due to expected planned radiations treatment with involving the cortex and medullary canal, she was brought to Surgery for biopsy of the lesion as well as stabilization before she fractured.  DESCRIPTION OF PROCEDURE:  After induction of general anesthesia, the patient was placed on the fracture table.  With careful padding, positioning, internal rotation, lateralization of the trochanter, C-arm was brought in for visualization, prepped with DuraPrep, Ancef was given prophylactically.  The area was squared with towels.  Large shower curtain Betadine drape was  applied.  Time-out procedure was done. Incision was started proximal to trochanter.  Gluteus medius was split. Tip of the trochanter was identified.  A Steinmann pin was placed, confirmed with fluoroscopic picture.  Pin was advanced, over-reamed with a drill, and then multiple passes were made with pituitary grabbing obtaining some tissue in the subtroch region, starting on the intertroch region and attempt to get the edge of the lesion which would be the best tissue biopsy for the pathologist.  Spot pictures were taken showing the pituitary inside intramedullary lesion by MRI in the subtroch region below the intertrochanteric line.  A thumb guide was inserted, it was twisted, and this was pulled out and used also to obtain some additional tissue.  Once all tissue was able to be grabbed with a pituitary, rod was advanced, measured, a 380 nail was selected, reaming to 10, followed by placement of the nail.  Pin was passed up, checked AP and lateral, measured and 100 mm lag screw was placed, locked in the top.  Side bar was released, and then freehand technique was used to the two distal interlocks.  Initially, the distal most interlock did not quite come all the way through the cortex, and it was switched out from a 40-42 mm.  A 38 was for the other and two-view x-rays showed good  position of distal as well as proximal interlocks.  One spot picture was taken with the pituitary in the area of the middle of the lesion just for documentation.  We were in the appropriate place for the biopsy.  About 500 mL blood loss occurred during the procedure.  Irrigation with saline solution.  Closure with #1 Vicryl in the fascia, 2-0 in subcutaneous tissues.  Skin sutures were used in case the patient needed additional MRI diagnostic studies instead of skin staples.  The patient tolerated the procedure well, postop dressing was applied and was transferred recovery room.  Marcaine was infiltrated, a total  of 30 mL for postoperative analgesia.     Quantarius Genrich C. [name], M.D.     MCY/MEDQ  D:  06/26/2015  T:  06/26/2015  Job:  [id number]

## 2015-06-26 NOTE — Progress Notes (Signed)
Family Medicine Teaching Service Daily Progress Note Intern Pager: [phone]  Patient name: [name] Medical record number: [id number] Date of birth: [birth year] Age: 50 y.o. Gender: female  Primary Care Provider: Vance Gather, MD Consultants: Orthopedic Surgery, Medical Oncology, Radiation Oncology Code Status: Full  Pt Overview and Major Events to Date:  6/13: Admitted to [facility] after MRI of L hip showing widespread metastatic disease.  Assessment and Plan: [name] is a 50 y.o. female presenting with widespread metastatic disease. PMH is significant for hx breast cancer s/p R sided mastectomy/chemo/radiation in 1999, congenital deafness, depression, anxiety.   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 Pt underwent MRI of left hip today for evaluation of left hip pain. MRI showed widespread bony metastatic disease with numerous foci of tumor throughout the bony pelvis and femurs.  - Pt's oncologist following.   X-rays ordered: Possible mets seen at T1 and in the posterior left 4th rib  Need surgical path specimen. Spoke with Dr. [name] this morning- he will send samples to path. - Radiation oncology consulted. Will hopefully see her today - Orthopedic surgery consulted. Will go for surgery today at 4pm for stabilization of left proximal femur pathologic lesion (impending pathologic fracture). Holding Lovenox, restart after surgery. - Non-weight bearing. - Norco 5-325 (1-2 tablets) every 4 hours prn and Tramadol 50mg  every 8 hours prn for pain - Vitals per unit routine  GERD: Takes TUMS at home. Endorsing heartburn on admission. - Pepcid 10mg  bid - GI cocktail prn  Hx Depression and Anxiety: Not on any medications at home. Pt states she is overwhelmed by everything that has happened today. - Ativan 1mg  q4hrs  prn for anxiety. - Will consider starting SSRI this admission - Consider palliative care consult or chaplain. Pt not interested at this time.  Normocytic Anemia: Hgb 10.7 from 11.8 2 weeks ago. 10.5 this morning. May be secondary to anemia of chronic disease or Pt may have some bone marrow involvement - Daily CBCs  FEN/GI: Regular diet, SLIV Prophylaxis: Lovenox  Disposition: Possible transfer to [facility] for radiation vs outpatient management. Will follow-up ortho post-op recs.  Subjective:  Pt is doing okay this morning. She states would like to do everything possible to beat this cancer. She was nauseated overnight, but is feeling better this morning.  Objective: Temp:  [97.7 F (36.5 C)-98.2 F (36.8 C)] 97.7 F (36.5 C) (06/14 0531) Pulse Rate:  [64-92] 64 (06/14 0531) Resp:  [10-23] 18 (06/14 0531) BP: (97-127)/(55-86) 97/55 mmHg (06/14 0531) SpO2:  [96 %-100 %] 98 % (06/14 0531) Weight:  [136 lb 9 oz (61.944 kg)] 136 lb 9 oz (61.944 kg) (06/13 [id number]) Physical Exam: General: Tired-appearing, tearful, communicating with sign language (interpreter present) HEENT: [place]/AT, EOMI, MMM Neck: Supple Cardiovascular: RRR, no murmurs Respiratory: CTAB, normal work of breathing Abdomen: Well-healed abdominal scar that extends transversely across the entire lower abdomen, +BS, soft, non-distended, diffusely tender to palpation (tenderness worse over the scar), no rebound, no guarding, no masses MSK: No edema or cyanosis Skin: Scar on abdomen as above, no rashes Neuro: Awake, alert, oriented, CN 2-12 grossly intact, no focal deficits Psych: Tearful affect, appropriate behavior  Laboratory:  Recent Labs Lab 06/25/15 1412 06/26/15 0340  WBC 5.0 5.2  HGB 10.7* 10.5*  HCT 32.4* 32.3*  PLT 246 259    Recent Labs Lab  06/25/15 1412 06/26/15 0340  NA 139 137  K 3.5 4.0  CL 104 101  CO2 27 29  BUN 8 9  CREATININE 0.87 0.94  CALCIUM 9.3 9.3  PROT 6.5 6.7  BILITOT 1.1 1.1   ALKPHOS 74 73  ALT 11* 11*  AST 16 13*  GLUCOSE 84 97    Imaging/Diagnostic Tests: - MRI L hip (6/13): Widespread bony metastatic disease with numerous foci of tumor throughout the bony pelvis and femurs. Confluent tumor in the left proximal femoral intertrochanteric region in shaft, where the medullary space is completely replaced by tumor over a 15 cm excursion, and where there some anterior bony cortical destruction, periosteal reaction, and possibly early periosteal extension of tumor along the proximal tibial metaphysis and shaft especially anteriorly. Low-level edema in the adjacent vastus intermedius and iliopsoas musculature in this area. In general the various tumor deposits are occult and/or highly inconspicuous on CT ; given the extensive tumor in the bony pelvis and hips, I would expect similar widespread osseous metastatic disease elsewhere in the axial and appendicular skeleton. Further workup with whole-body bone scan, nuclear medicine PET-CT, and/or MRI workup may be warranted. - X-ray cervical spine: Possible metastatic lesions in the left transverse process at T1 and the posterior 4th rib. - CXR: possible expansile lesion in the left posterior 4th rib - X-ray left humerus: No focal bone lesions - X-ray right humerus: No focal bone lesions - X-ray right tib/fib: No focal bone lesions - X-ray left tib/fib: No focal bone lesions  [name], MD 06/26/2015, 7:13 AM PGY-1, [place] Intern pager: [phone], text pages welcome

## 2015-06-26 NOTE — Anesthesia Procedure Notes (Signed)
Procedure Name: Intubation Date/Time: 06/26/2015 4:08 PM Performed by: Izora Gala Pre-anesthesia Checklist: Patient identified, Emergency Drugs available, Suction available and Patient being monitored Patient Re-evaluated:Patient Re-evaluated prior to inductionOxygen Delivery Method: Circle system utilized Preoxygenation: Pre-oxygenation with 100% oxygen Intubation Type: IV induction Ventilation: Mask ventilation without difficulty Laryngoscope Size: Miller and 3 Grade View: Grade I Tube type: Oral Tube size: 7.5 mm Number of attempts: 1 Airway Equipment and Method: Stylet and LTA kit utilized Placement Confirmation: ETT inserted through vocal cords under direct vision,  positive ETCO2,  CO2 detector and breath sounds checked- equal and bilateral Secured at: 22 cm Tube secured with: Tape Dental Injury: Teeth and Oropharynx as per pre-operative assessment

## 2015-06-26 NOTE — Progress Notes (Signed)
Medical Oncology June 26, 2015, 9:11 AM  Hospital day 2 Antibiotics: none Chemotherapy: none since adjuvant therapy 1999  EMR reviewed. Patient seen, together with husband and sign language interpreter.  Subjective: Slept a little last pm. No pain in left hip in bed; no pain now elsewhere. NPO for surgery, is hungry. Would like to discuss surgery again today with orthopedist and sign language interpreter, as discussion last pm was written. NSL left antecubital uncomfortable location. No SOB, no bleeding.   Objective: Vital signs in last 24 hours: Blood pressure 97/55, pulse 64, temperature 97.7 F (36.5 C), temperature source Oral, resp. rate 18, height 5' 7"  (1.702 m), weight 136 lb 9 oz (61.944 kg), SpO2 98 %.   Intake/Output from previous day: 06/13 0701 - 06/14 0700 In: 360 [P.O.:360] Out: 700 [Urine:700] Intake/Output this shift: Total I/O In: -  Out: 250 [Urine:250]  Physical exam: awake, alert, looks comfortable in bed at 60 degrees, respirations not labored. NSL left antecubital, site with minimal bruising, no swelling.   Lab Results:  Recent Labs  06/25/15 1412 06/26/15 0340  WBC 5.0 5.2  HGB 10.7* 10.5*  HCT 32.4* 32.3*  PLT 246 259   BMET  Recent Labs  06/25/15 1412 06/26/15 0340  NA 139 137  K 3.5 4.0  CL 104 101  CO2 27 29  GLUCOSE 84 97  BUN 8 9  CREATININE 0.87 0.94  CALCIUM 9.3 9.3    Studies/Results: Mr Hip Left W Wo Contrast  06/25/2015  CLINICAL DATA:  Left hip pain for 3 months. History of breast cancer EXAM: MRI OF THE LEFT HIP WITHOUT AND WITH CONTRAST TECHNIQUE: Multiplanar, multisequence MR imaging was performed both before and after administration of intravenous contrast. CONTRAST:  56m MULTIHANCE GADOBENATE DIMEGLUMINE 529 MG/ML IV SOLN COMPARISON:  06/10/2015 FINDINGS: Please note that in order to encompasses much of the osseous metastatic disease is possible, large field of view images were employed even on the sagittal images, a  deviation from the normal orthopedic a protocol. This covers more volume but does inevitably result in reduced spatial resolution. Bones: Widespread osseous metastatic disease throughout the visualized pelvis. Index right posterior acetabular wall lesion 2.3 by 1.1 cm on image 9 series 5. There is particularly extensive tumor in the left proximal femoral neck, intertrochanteric region, and especially the proximal 15 cm of the shaft in which the medullary space it is replaced by tumor. There is periostitis and potentially early periosteal spread of tumor specially anteriorly in the proximal femoral metaphysis where there is moth-eaten endosteal scalloping and probable mild cortical breakthrough. Low-level edema and adjacent anterior musculature in this vicinity. My opinion is that structural integrity of the proximal femur is likely compromised, with increased risk of fracture. There is considerable tumor in the left anterior acetabular wall. Multiple foci of tumor present in the right proximal femur, the largest measuring 1.6 cm in the right femoral shaft on image 16/5. These osseous metastatic lesions are surprisingly difficult to visualize on the CT scan. Articular cartilage and labrum Articular cartilage: Grossly intact, reduced sensitivity due to reduce spatial resolution. Labrum: No obvious paralabral cyst, sensitivity for labral tear is significantly reduced by the large field of view employed. Joint or bursal effusion Joint effusion:  Absent Bursae:  No regional bursitis Muscles and tendons Muscles and tendons: Aside from the low-level edema along the left iliopsoas and vastus intermedius musculature proximally due to tumor involvement of the cortex and periosteum of the left proximal femur, muscles and tendons appear  unremarkable. Other findings Miscellaneous: No pathologic adenopathy in the pelvis is identified. There are a few sigmoid colon diverticula. IMPRESSION: 1. Unfortunately there is widespread bony  metastatic disease with numerous foci of tumor throughout the bony pelvis and femurs. Confluent tumor in the left proximal femoral intertrochanteric region in shaft, where the medullary space is completely replaced by tumor over a 15 cm excursion, and where there some anterior bony cortical destruction, periosteal reaction, and possibly early periosteal extension of tumor along the proximal tibial metaphysis and shaft especially anteriorly. Low-level edema in the adjacent vastus intermedius and iliopsoas musculature in this area. In general the various tumor deposits are occult and/or highly inconspicuous on CT ; given the extensive tumor in the bony pelvis and hips, I would expect similar widespread osseous metastatic disease elsewhere in the axial and appendicular skeleton. Further workup with whole-body bone scan, nuclear medicine PET-CT, and/or MRI workup may be warranted. These results will be called to the ordering clinician or representative by the Radiologist Assistant, and communication documented in the PACS or zVision Dashboard. Electronically Signed   By: Van Clines M.D.   On: 06/25/2015 11:17   Dg Chest Port 1v Same Day  06/25/2015  CLINICAL DATA:  Left hip pain.  Bone metastasis on MRI. EXAM: PORTABLE CHEST 1 VIEW COMPARISON:  02/18/2008 FINDINGS: Shallow inspiration. Heart size and pulmonary vascularity are normal. Lungs appear clear and expanded. Postoperative changes with right mastectomy and surgical clips in the right axilla. Surgical clips in the right upper quadrant. Left posterior fourth rib expansile process again demonstrated. Visualized bones appear otherwise intact. Consider bone scan for more sensitive evaluation of bone metastasis. IMPRESSION: No evidence of active pulmonary disease. Possible expansile lesion in the left posterior fourth rib. Electronically Signed   By: Lucienne Capers M.D.   On: 06/25/2015 22:17   Dg Tibia/fibula Left Port  06/25/2015  CLINICAL DATA:   Assess for metastatic disease to the bone. Initial encounter. EXAM: PORTABLE LEFT TIBIA AND FIBULA - 2 VIEW COMPARISON:  None. FINDINGS: A small bone island is noted at the proximal tibia. There is no evidence of metastatic disease to the bone on radiograph. The tibia and fibula appear grossly intact. The knee joint is incompletely assessed, but appears grossly unremarkable. No definite soft tissue abnormalities are characterized on radiograph. IMPRESSION: No evidence of metastatic disease to the tibia or fibula on radiograph. Electronically Signed   By: Garald Balding M.D.   On: 06/25/2015 22:15   Dg Tibia/fibula Right Port  06/25/2015  CLINICAL DATA:  Assess for metastatic disease to the bone. Initial encounter. EXAM: PORTABLE RIGHT TIBIA AND FIBULA - 2 VIEW COMPARISON:  Right ankle radiographs performed 09/04/2004 FINDINGS: There is no evidence of metastatic disease to the bone. The tibia and fibula appear intact. The ankle joint is grossly unremarkable. The knee joint is incompletely assessed, but appears grossly unremarkable. No definite soft tissue abnormalities are characterized on radiograph. IMPRESSION: No evidence of metastatic disease to the tibia or fibula on radiograph. Electronically Signed   By: Garald Balding M.D.   On: 06/25/2015 22:16   Cerv Spine Port(clearing)  06/25/2015  CLINICAL DATA:  Left hip pain.  Suspected bone metastasis. EXAM: PORTABLE CERVICAL SPINE (CLEARING) COMPARISON:  None. FINDINGS: Limited portable AP and cross-table lateral views of the cervical spine are obtained. Visualized cervical vertebrae demonstrate normal alignment. No compression deformities. Suggestion of lucent infiltrating pattern in the transverse process of T1 on the left possibly indicating metastasis. Focal expansile lesion suggested in  the posterior left fourth rib. Consider bone scan for more sensitive evaluation. IMPRESSION: Normal alignment of the visualized cervical spine. Possible metastatic lesions  in the left transverse process at T1 and the posterior left fourth rib. Electronically Signed   By: Lucienne Capers M.D.   On: 06/25/2015 22:15   Dg Humerus Left  06/25/2015  CLINICAL DATA:  Left hip pain.  Bone metastases. EXAM: LEFT HUMERUS - 2+ VIEW COMPARISON:  None. FINDINGS: Single View of the left humerus demonstrates no focal bone lesions. IMPRESSION: No focal bone lesions demonstrated on single view left humerus. Electronically Signed   By: Lucienne Capers M.D.   On: 06/25/2015 22:18   Dg Humerus Right  06/25/2015  CLINICAL DATA:  Left hip pain.  Bone metastasis. EXAM: RIGHT HUMERUS - 2+ VIEW COMPARISON:  None. FINDINGS: There is no evidence of fracture or other focal bone lesions demonstrated on single view of the right humerus. Soft tissues are unremarkable. IMPRESSION: No focal bone lesions demonstrated on single view right humerus. Electronically Signed   By: Lucienne Capers M.D.   On: 06/25/2015 22:19   Dg Femur Port, 1v Right  06/25/2015  CLINICAL DATA:  Assess for metastatic disease to the bone. Initial encounter. EXAM: RIGHT FEMUR PORTABLE 1 VIEW COMPARISON:  None. FINDINGS: The right femur is unremarkable in appearance, aside from a small subcortical cyst at the femoral neck. There is no evidence of fracture or dislocation. No lytic or blastic metastases are seen within the visualized osseous structures. The knee joint is grossly unremarkable, though incompletely assessed. The right femoral head remains seated at the acetabulum. Postoperative change is noted at the right hemipelvis. No definite soft tissue abnormalities are characterized on radiograph. IMPRESSION: No evidence of fracture or dislocation. No metastatic bony lesions seen on radiograph. Electronically Signed   By: Garald Balding M.D.   On: 06/25/2015 22:11   PACs images reviewed by MD    DISCUSSION Reviewed findings on MRI yesterday, which shows extensive involvement in pelvis as well as left hip and femur. Explained  that portable xrays were done last pm due to complaints of intermittent discomfort "neck" , chest, upper arms, as well as weight bearing bones RLE and left lower leg. Tho we know this process is difficult to see on plain films and may be more extensive, these films show nothing obvious in other long bones, possible area at T1 transverse process on left and left 4th rib posteriorly. I plan PET after DC from hospital, which I will try to get my office to set up (generally takes up to 2 weeks for preauth for outpatient PET).  I have gone over Dr [name]' written diagram and notes with patient now.  She understands that this will make the bone sturdy enough to walk and that the surgery will not spread the cancer. She understands that Dr [name] will send surgical path, either just bone (which will take up to a few weeks for final path) or possibly soft tissue also, which would give most prompt information.  I have told them again that we need pathology information to make treatment plans and discuss prognosis.   RN aware that patient would like to speak with orthopedics with interpreter at some point today, but is in agreement with surgery.  Assessment/Plan: 1.MRI evidence of extensive bone involvement of left hip and pelvis, likely metastatic cancer: impending pathologic fracture left femur. Nonweight bearing for now. Appreciate Dr [name] seeing her, plan surgery left femur 06-26-15. NEED SURGICAL PATH INCLUDING  SOFT TISSUE IF POSSIBLE. Plain Xrays, which did not identify problem in left hip, do not show obvious impending fracture in other weight bearing/ long bones, do show likely area T1 and left 4th rib post. She is having no pain in those areas presently.    Radiation oncology also aware of patient, consult pending.  2.right breast cancer T1 N1 (1 of 7 nodes) ER PR + and HER 2 + in 1999 at age 50. Treated with mastectomy/TRAM reconstruction and 7 node right axillary evaluation, adriamycin cytoxan x 4 followed  by taxol, 5 years of tamoxifen (no HER 2 therapy in 1999).  She also had oophorectomy 2005, was intolerant of aromatase inhibitor. BRCA testing negative 03-2015. This breast cancer has not been known recurrent to this point [place] by orthopedics - any soft tissue possible will be faster to get path information than bone - thank you. 3.I am not clear that she became menopausal with chemo, but did have oophorectomy 05-2003, which was done particularly for complaints of severe abdominal pain. Bone density scan 03-14-15 osteopenia, lowest T score -1.8 4.decrease in hemoglobin over last few weeks, now mildly anemic. May reflect bone marrow involvement, but as not been evaluated further. Hemoglobin slightly lower again today, no IVF now 5.congenital deafness. Husband also deaf and communicates by signing. 6.positional vertigo followed by neurology. I believe she does not tolerate lying on stomach (as for breast MRI) 7.post laparoscopic cholecystectomy 2004   Please page me if needed between my rounds [phone].   LIVESAY,LENNIS P

## 2015-06-27 ENCOUNTER — Other Ambulatory Visit: Payer: Self-pay | Admitting: Oncology

## 2015-06-27 ENCOUNTER — Encounter (HOSPITAL_COMMUNITY): Payer: Self-pay | Admitting: Orthopaedic Surgery

## 2015-06-27 DIAGNOSIS — M25552 Pain in left hip: Secondary | ICD-10-CM | POA: Insufficient documentation

## 2015-06-27 LAB — BASIC METABOLIC PANEL
Anion gap: 10 (ref 5–15)
BUN: 11 mg/dL (ref 6–20)
CALCIUM: 8.9 mg/dL (ref 8.9–10.3)
CO2: 24 mmol/L (ref 22–32)
Chloride: 100 mmol/L — ABNORMAL LOW (ref 101–111)
Creatinine, Ser: 0.88 mg/dL (ref 0.44–1.00)
GFR calc Af Amer: >60 mL/min (ref >60)
GLUCOSE: 156 mg/dL — AB (ref 65–99)
Potassium: 4.3 mmol/L (ref 3.5–5.1)
Sodium: 134 mmol/L — ABNORMAL LOW (ref 135–145)

## 2015-06-27 LAB — CANCER ANTIGEN 27.29: CA 27.29: 72.5 U/mL — AB (ref 0.0–38.6)

## 2015-06-27 LAB — CBC
HEMATOCRIT: 28.5 % — AB (ref 36.0–46.0)
HEMOGLOBIN: 9.5 g/dL — AB (ref 12.0–15.0)
MCH: 28 pg (ref 26.0–34.0)
MCHC: 33.3 g/dL (ref 30.0–36.0)
MCV: 84.1 fL (ref 78.0–100.0)
Platelets: 249 10*3/uL (ref 150–400)
RBC: 3.39 MIL/uL — AB (ref 3.87–5.11)
RDW: 12.1 % (ref 11.5–15.5)
WBC: 6 10*3/uL (ref 4.0–10.5)

## 2015-06-27 LAB — MISC LABCORP TEST (SEND OUT)
LabCorp test name: 3
Labcorp test code: 143404

## 2015-06-27 LAB — CEA: CEA: 9.1 ng/mL — AB (ref 0.0–4.7)

## 2015-06-27 NOTE — Clinical Social Work Note (Signed)
CSW received referral for SNF.  Case discussed with case manager, and plan is to discharge home with home health.  CSW to sign off please re-consult if social work needs arise.  Kein Carlberg R. Daphnie Venturini, MSW, LCSWA [phone]  

## 2015-06-27 NOTE — Evaluation (Signed)
Physical Therapy Evaluation Patient Details Name: [name] MRN: [id number] DOB: [birth year] Today's Date: 06/27/2015   History of Present Illness  50 y.o. 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 Pt now s/p Biopsy of metastatic tissue and stabilization with trochanteric nail. PMH: deaf, breast cancer, anxiety, depression.   Clinical Impression  Pt seen for initial evaluation and treatment. At this time the patient was able to perform bed mobility as well as transfers with min assistance by end of session. Mobility was primarily limited by pain and fatigue. Anticipating that the patient will be able to D/C to home with family support as her mobility progresses. Will continue to work on ambulation and stairs prior to D/C.     Follow Up Recommendations Home health PT;Supervision for mobility/OOB    Equipment Recommendations  Rolling walker with 5" wheels    Recommendations for Other Services       Precautions / Restrictions Precautions Precautions: Fall Restrictions Weight Bearing Restrictions: Yes LLE Weight Bearing: Weight bearing as tolerated      Mobility  Bed Mobility Overal bed mobility: Needs Assistance Bed Mobility: Supine to Sit     Supine to sit: Min assist;HOB elevated     General bed mobility comments: Min assist provided with LLE, pt using rail to assist to sitting EOB  Transfers Overall transfer level: Needs assistance Equipment used: Rolling walker (2 wheeled) Transfers: Sit to/from Stand Sit to Stand: Min assist;+2 physical assistance;Mod assist Stand pivot transfers: Min assist;+2 safety/equipment (cues and assist for safety. )       General transfer comment: Initially +2 min assist with sit/stand but improving to +1 min assist and cues by end of session. Pt performing transfers from bed-chair-BSC.   Ambulation/Gait             General Gait Details:  ambulation not attempted at this time.   Stairs            Wheelchair Mobility    Modified Rankin (Stroke Patients Only)       Balance Overall balance assessment: Needs assistance Sitting-balance support: No upper extremity supported Sitting balance-Leahy Scale: Good     Standing balance support: Bilateral upper extremity supported Standing balance-Leahy Scale: Poor Standing balance comment: using rw for support                             Pertinent Vitals/Pain Pain Assessment: 0-10 Pain Score: 8  Pain Location: Lt hip Pain Descriptors / Indicators: Sharp Pain Intervention(s): Limited activity within patient's tolerance;Monitored during session    Home Living Family/patient expects to be discharged to:: Private residence Living Arrangements: Spouse/significant other;Children Available Help at Discharge: Family Type of Home: Apartment Home Access: Stairs to enter Entrance Stairs-Rails: Psychiatric nurse of Steps: 3 Home Layout: One level Home Equipment: None Additional Comments: will have husband or 37 y.o. daughter available to assist at home.     Prior Function Level of Independence: Independent               Hand Dominance   Dominant Hand: Right    Extremity/Trunk Assessment   Upper Extremity Assessment: Defer to OT evaluation           Lower Extremity Assessment: LLE deficits/detail   LLE Deficits / Details: assist needed with moving LLE for bed mobility, primarily related to pain.      Communication  Communication: Deaf;Interpreter utilized  Cognition Arousal/Alertness: Awake/alert Behavior During Therapy: Anxious Overall Cognitive Status: Within Functional Limits for tasks assessed                      General Comments      Exercises        Assessment/Plan    PT Assessment Patient needs continued PT services  PT Diagnosis Difficulty walking   PT Problem List Decreased  strength;Decreased range of motion;Decreased activity tolerance;Decreased balance;Decreased mobility  PT Treatment Interventions DME instruction;Gait training;Stair training;Functional mobility training;Therapeutic activities;Therapeutic exercise;Patient/family education   PT Goals (Current goals can be found in the Care Plan section) Acute Rehab PT Goals Patient Stated Goal: be safe and mobile PT Goal Formulation: With patient Time For Goal Achievement: 07/11/15 Potential to Achieve Goals: Good    Frequency Min 5X/week   Barriers to discharge        Co-evaluation PT/OT/SLP Co-Evaluation/Treatment: Yes Reason for Co-Treatment: For patient/therapist safety PT goals addressed during session: Mobility/safety with mobility         End of Session Equipment Utilized During Treatment: Gait belt Activity Tolerance: Patient limited by fatigue;Patient limited by pain Patient left: in chair;with call bell/phone within reach;with family/visitor present Nurse Communication: Mobility status;Weight bearing status         Time: [id number] PT Time Calculation (min) (ACUTE ONLY): 39 min   Charges:   PT Evaluation $PT Eval Moderate Complexity: 1 Procedure     PT G Codes:        Cassell Clement, PT, CSCS Pager [phone] Office [phone]  06/27/2015, 11:00 AM

## 2015-06-27 NOTE — Evaluation (Signed)
Occupational Therapy Evaluation Patient Details Name: [name] MRN: [id number] DOB: [birth year] Today's Date: 06/27/2015    History of Present Illness 50 y.o. 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 Pt now s/p Biopsy of metastatic tissue and stabilization with trochanteric nail. PMH: deaf, breast cancer, anxiety, depression.    Clinical Impression   Pt with decline in function and safety with ADLs and ADL mobility with decreased endurance and activity tolerance. Pt would benefit from acute OT services to address impairments to increase level of function and safety to return home. Pt's husband working during the day, however their 30 y/o daughter will be able to be with and assist pt    Follow Up Recommendations  Home health OT;Supervision/Assistance - 24 hour    Equipment Recommendations  Tub/shower bench;Other (comment) (ADL A/E)    Recommendations for Other Services       Precautions / Restrictions Precautions Precautions: Fall Restrictions Weight Bearing Restrictions: Yes LLE Weight Bearing: Weight bearing as tolerated      Mobility Bed Mobility Overal bed mobility: Needs Assistance Bed Mobility: Supine to Sit     Supine to sit: Min assist;HOB elevated     General bed mobility comments: Min assist provided with LLE, pt using rail to assist to sitting EOB  Transfers Overall transfer level: Needs assistance Equipment used: Rolling walker (2 wheeled) Transfers: Sit to/from Stand Sit to Stand: Min assist;+2 physical assistance;Mod assist Stand pivot transfers: Min assist;+2 safety/equipment       General transfer comment: Initially +2 min assist with sit/stand but improving to +1 min assist and cues by end of session. Pt performing transfers from bed-chair-BSC.     Balance Overall balance assessment: Needs assistance Sitting-balance support: No upper extremity  supported;Feet supported Sitting balance-Leahy Scale: Good     Standing balance support: Bilateral upper extremity supported;During functional activity Standing balance-Leahy Scale: Poor Standing balance comment: using rw for support                            ADL Overall ADL's : Needs assistance/impaired     Grooming: Wash/dry hands;Wash/dry face;Sitting;Set up   Upper Body Bathing: Set up;Min guard;Sitting   Lower Body Bathing: Maximal assistance   Upper Body Dressing : Set up;Min guard;Sitting   Lower Body Dressing: Total assistance   Toilet Transfer: Minimal assistance;+2 for physical assistance   Toileting- Clothing Manipulation and Hygiene: Maximal assistance;Set up [place] Manipulation Details (indicate cue type and reason): max A for clothing mgt, set up with sup for hgyiene seated on BSC     Functional mobility during ADLs: Minimal assistance;+2 for physical assistance       Vision  reading glasses, no change from baseline              Pertinent Vitals/Pain Pain Assessment: 0-10 Pain Score: 8  Pain Location: L hip Pain Descriptors / Indicators: Sharp Pain Intervention(s): Limited activity within patient's tolerance;Monitored during session;Repositioned     Hand Dominance Right   Extremity/Trunk Assessment Upper Extremity Assessment Upper Extremity Assessment: Overall WFL for tasks assessed   Lower Extremity Assessment Lower Extremity Assessment: Defer to PT evaluation LLE Deficits / Details: assist needed with moving LLE for bed mobility, primarily related to pain.        Communication Communication Communication: Deaf;Interpreter utilized   Cognition Arousal/Alertness: Awake/alert Behavior During Therapy: Anxious Overall Cognitive Status: Within Functional  Limits for tasks assessed                     General Comments   pt pleasant, cooperative. Husband supportive                 Home Living  Family/patient expects to be discharged to:: Private residence Living Arrangements: Spouse/significant other;Children Available Help at Discharge: Family Type of Home: Apartment Home Access: Stairs to enter Technical brewer of Steps: 3 Entrance Stairs-Rails: Right;Left Home Layout: One level     Bathroom Shower/Tub: Teacher, early years/pre: Standard     Home Equipment: None   Additional Comments: will have husband or 37 y.o. daughter available to assist at home.       Prior Functioning/Environment Level of Independence: Independent             OT Diagnosis: Generalized weakness;Acute pain   OT Problem List: Pain;Impaired balance (sitting and/or standing);Decreased activity tolerance;Decreased knowledge of use of DME or AE   OT Treatment/Interventions: Self-care/ADL training;Patient/family education;Therapeutic activities;DME and/or AE instruction    OT Goals(Current goals can be found in the care plan section) Acute Rehab OT Goals Patient Stated Goal: be safe and mobile OT Goal Formulation: With patient/family Time For Goal Achievement: 07/04/15 Potential to Achieve Goals: Good ADL Goals Pt Will Perform Grooming: with min assist;with mod assist;standing Pt Will Perform Upper Body Bathing: with set-up;with supervision;sitting Pt Will Perform Lower Body Bathing: with mod assist;sitting/lateral leans;sit to/from stand Pt Will Perform Upper Body Dressing: with set-up;with supervision;sitting Pt Will Perform Lower Body Dressing: with max assist;with mod assist;with caregiver independent in assisting;with adaptive equipment;sitting/lateral leans;sit to/from stand Pt Will Transfer to Toilet: with min assist;with min guard assist;ambulating;grab bars (3 in 1 over toilet) Pt Will Perform Toileting - Clothing Manipulation and hygiene: with set-up Pt Will Perform Tub/Shower Transfer: with min assist;with min guard assist;tub bench  OT Frequency: Min 2X/week    Barriers to D/C:    none       Co-evaluation PT/OT/SLP Co-Evaluation/Treatment: Yes Reason for Co-Treatment: For patient/therapist safety PT goals addressed during session: Mobility/safety with mobility OT goals addressed during session: ADL's and self-care      End of Session Equipment Utilized During Treatment: Gait belt;Rolling walker;Other (comment) [facility]) Nurse Communication: Mobility status  Activity Tolerance: Patient limited by pain Patient left: in chair;with call bell/phone within reach;with family/visitor present   Time: [id number] OT Time Calculation (min): 35 min Charges:  OT General Charges $OT Visit: 1 Procedure OT Evaluation $OT Eval Moderate Complexity: 1 Procedure OT Treatments $Self Care/Home Management : 8-22 mins G-Codes:    Britt Bottom 06/27/2015, 11:15 AM

## 2015-06-27 NOTE — Progress Notes (Signed)
Subjective: 1 Day Post-Op Procedure(s) (LRB): LEFT BIOMET LONG AFFIXS NAIL (Left) Patient reports pain as moderate and severe.  Falling asleep after pain meds.   Objective: Vital signs in last 24 hours: Temp:  [97 F (36.1 C)-98.3 F (36.8 C)] 98.3 F (36.8 C) (06/15 0542) Pulse Rate:  [66-99] 86 (06/15 0542) Resp:  [11-24] 19 (06/15 0542) BP: (99-129)/(55-70) 129/68 mmHg (06/15 0542) SpO2:  [96 %-100 %] 100 % (06/15 0542)  Intake/Output from previous day: 06/14 0701 - 06/15 0700 In: 2564.2 [P.O.:480; I.V.:2084.2] Out: 650 [Urine:250; Blood:400] Intake/Output this shift:     Recent Labs  06/25/15 1412 06/26/15 0340 06/27/15 0436  HGB 10.7* 10.5* 9.5*    Recent Labs  06/26/15 0340 06/27/15 0436  WBC 5.2 6.0  RBC 3.70* 3.39*  HCT 32.3* 28.5*  PLT 259 249    Recent Labs  06/26/15 0340 06/27/15 0436  NA 137 134*  K 4.0 4.3  CL 101 100*  CO2 29 24  BUN 9 11  CREATININE 0.94 0.88  GLUCOSE 97 156*  CALCIUM 9.3 8.9   No results for input(s): LABPT, INR in the last 72 hours.  Neurologically intact  Assessment/Plan: 1 Day Post-Op Procedure(s) (LRB): LEFT BIOMET LONG AFFIXS NAIL (Left) Up with therapy  Julena Barbour C 06/27/2015, 8:16 AM

## 2015-06-27 NOTE — Progress Notes (Signed)
Family Medicine Teaching Service Daily Progress Note Intern Pager: [phone]  Patient name: [name] Medical record number: [id number] Date of birth: December 18, 1965 Age: 50 y.o. Gender: female  Primary Care Provider: Vance Gather, MD Consultants: Orthopedic Surgery, Medical Oncology, Radiation Oncology Code Status: Full  Pt Overview and Major Events to Date:  6/13: Admitted to FMTS after MRI of L hip showing widespread metastatic disease.  Assessment and Plan: [name] is a 50 y.o. female presenting with widespread metastatic disease. PMH is significant for hx breast cancer s/p R sided mastectomy/chemo/radiation in 1999, congenital deafness, depression, anxiety.   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 Pt underwent MRI of left hip today for evaluation of left hip pain. MRI showed widespread bony metastatic disease with numerous foci of tumor throughout the bony pelvis and femurs.  - Pt's oncologist following. CEA: 9.1 (H), cancer antigen 27.29: 72.5 (H), Cancer antigen 15-3: 59.1 (H) - Radiation oncology consulted. Anticipate starting radiation therapy ~2 weeks post op as either an inpatient or outpatient. - Orthopedic surgery consulted. S/p stabilization of L femur and biopsy of metastatic tissue. Up with PT today. - Follow-up surgical path specimens - Dilaudid 0.5-1mg  q3hrs prn and OxyIR 5-10mg  q6hrs prn for pain - Zofran and Reglan for nausea - Vitals per unit routine  GERD: Takes TUMS at home.  - Pepcid 10mg  bid - GI cocktail prn  Hx Depression and Anxiety: Not on any medications at home. Pt states she is overwhelmed by everything that has happened today. - Ativan 1mg  q4hrs prn for anxiety. - Will consider starting SSRI this admission - Consider palliative care consult or chaplain. Pt not interested at  this time.  Normocytic Anemia: Hgb 10.7 on admission > 10.5 > 9.5. (Hgb was 11.8 two weeks ago). May be secondary to anemia of chronic disease or Pt may have some bone marrow involvement - Daily CBCs  FEN/GI: Regular diet, SLIV Prophylaxis: Lovenox  Disposition: Pending PT evaluation. Will need to start planning for discharge, unclear at this time if she will be going to CIR, SNF, or home with home health.   Subjective:  Pt states she is in a lot of pain this morning. The pain medications are helping but are causing her to be very sleepy. She has no other concerns this morning.  Objective: Temp:  [97 F (36.1 C)-98.3 F (36.8 C)] 98.3 F (36.8 C) (06/15 0542) Pulse Rate:  [66-99] 86 (06/15 0542) Resp:  [11-24] 19 (06/15 0542) BP: (99-129)/(55-70) 129/68 mmHg (06/15 0542) SpO2:  [96 %-100 %] 100 % (06/15 0542) Physical Exam: General: Tired-appearing, winces with movement of her leg, communicating with sign language (interpreter present) HEENT: [place]/AT, EOMI, MMM Neck: Supple Cardiovascular: RRR, no murmurs Respiratory: CTAB, normal work of breathing Abdomen: +BS, soft, non-distended, mild diffuse tenderness to palpation, no rebound, no guarding, no masses MSK: No edema or cyanosis Skin: Transverse scar across abdomen, no rashes Neuro: Awake, alert, oriented, CN 2-12 grossly intact, no focal deficits Psych: Tearful affect, appropriate behavior  Laboratory:  Recent Labs Lab 06/25/15 1412 06/26/15 0340 06/27/15 0436  WBC 5.0 5.2 6.0  HGB 10.7* 10.5* 9.5*  HCT 32.4* 32.3* 28.5*  PLT 246 259 249    Recent Labs Lab 06/25/15 1412 06/26/15 0340 06/27/15 0436  NA 139 137 134*  K 3.5 4.0 4.3  CL 104 101 100*  CO2 27 29 24   BUN 8 9 11   CREATININE 0.87 0.94 0.88  CALCIUM 9.3 9.3 8.9  PROT 6.5 6.7  --   BILITOT 1.1 1.1  --   ALKPHOS 74 73  --   ALT 11* 11*  --   AST 16 13*  --   GLUCOSE 84 97 156*    Imaging/Diagnostic Tests: - MRI L hip (6/13): Widespread bony  metastatic disease with numerous foci of tumor throughout the bony pelvis and femurs. Confluent tumor in the left proximal femoral intertrochanteric region in shaft, where the medullary space is completely replaced by tumor over a 15 cm excursion, and where there some anterior bony cortical destruction, periosteal reaction, and possibly early periosteal extension of tumor along the proximal tibial metaphysis and shaft especially anteriorly. Low-level edema in the adjacent vastus intermedius and iliopsoas musculature in this area. In general the various tumor deposits are occult and/or highly inconspicuous on CT ; given the extensive tumor in the bony pelvis and hips, I would expect similar widespread osseous metastatic disease elsewhere in the axial and appendicular skeleton. Further workup with whole-body bone scan, nuclear medicine PET-CT, and/or MRI workup may be warranted. - X-ray cervical spine: Possible metastatic lesions in the left transverse process at T1 and the posterior 4th rib. - CXR: possible expansile lesion in the left posterior 4th rib - X-ray left humerus: No focal bone lesions - X-ray right humerus: No focal bone lesions - X-ray right tib/fib: No focal bone lesions - X-ray left tib/fib: No focal bone lesions  [name], MD 06/27/2015, 7:14 AM PGY-1, [place] Intern pager: [phone], text pages welcome

## 2015-06-28 ENCOUNTER — Telehealth: Payer: Self-pay | Admitting: Oncology

## 2015-06-28 ENCOUNTER — Encounter (HOSPITAL_COMMUNITY): Payer: Self-pay | Admitting: Radiology

## 2015-06-28 ENCOUNTER — Inpatient Hospital Stay (HOSPITAL_COMMUNITY): Payer: Medicaid Other

## 2015-06-28 DIAGNOSIS — M8580 Other specified disorders of bone density and structure, unspecified site: Secondary | ICD-10-CM

## 2015-06-28 DIAGNOSIS — K5900 Constipation, unspecified: Secondary | ICD-10-CM

## 2015-06-28 LAB — CBC
HCT: 25.7 % — ABNORMAL LOW (ref 36.0–46.0)
HEMOGLOBIN: 8.3 g/dL — AB (ref 12.0–15.0)
MCH: 27.9 pg (ref 26.0–34.0)
MCHC: 32.3 g/dL (ref 30.0–36.0)
MCV: 86.2 fL (ref 78.0–100.0)
Platelets: 239 10*3/uL (ref 150–400)
RBC: 2.98 MIL/uL — ABNORMAL LOW (ref 3.87–5.11)
RDW: 12.3 % (ref 11.5–15.5)
WBC: 6.8 10*3/uL (ref 4.0–10.5)

## 2015-06-28 LAB — BASIC METABOLIC PANEL
Anion gap: 8 (ref 5–15)
BUN: 9 mg/dL (ref 6–20)
CALCIUM: 8.6 mg/dL — AB (ref 8.9–10.3)
CHLORIDE: 101 mmol/L (ref 101–111)
CO2: 29 mmol/L (ref 22–32)
CREATININE: 0.88 mg/dL (ref 0.44–1.00)
GFR calc non Af Amer: >60 mL/min (ref >60)
Glucose, Bld: 105 mg/dL — ABNORMAL HIGH (ref 65–99)
Potassium: 3.5 mmol/L (ref 3.5–5.1)
SODIUM: 138 mmol/L (ref 135–145)

## 2015-06-28 LAB — RETICULOCYTES
RBC.: 2.92 MIL/uL — AB (ref 3.87–5.11)
RETIC CT PCT: 2.4 % (ref 0.4–3.1)
Retic Count, Absolute: 70.1 10*3/uL (ref 19.0–186.0)

## 2015-06-28 MED ORDER — POLYETHYLENE GLYCOL 3350 17 G PO PACK
17.0000 g | PACK | Freq: Every day | ORAL | Status: DC
  Administered 2015-06-28: 17 g via ORAL
  Filled 2015-06-28: qty 1

## 2015-06-28 MED ORDER — ENOXAPARIN SODIUM 30 MG/0.3ML ~~LOC~~ SOLN
30.0000 mg | Freq: Two times a day (BID) | SUBCUTANEOUS | Status: DC
  Administered 2015-06-28 – 2015-07-02 (×6): 30 mg via SUBCUTANEOUS
  Filled 2015-06-28 (×7): qty 0.3

## 2015-06-28 MED ORDER — BOOST PLUS PO LIQD
237.0000 mL | Freq: Three times a day (TID) | ORAL | Status: DC
  Administered 2015-06-30: 237 mL via ORAL
  Filled 2015-06-28 (×14): qty 237

## 2015-06-28 MED ORDER — SODIUM CHLORIDE 0.9 % IV SOLN
90.0000 mg | Freq: Once | INTRAVENOUS | Status: AC
  Administered 2015-06-28: 90 mg via INTRAVENOUS
  Filled 2015-06-28: qty 10

## 2015-06-28 MED ORDER — ENSURE ENLIVE PO LIQD
237.0000 mL | Freq: Two times a day (BID) | ORAL | Status: DC
  Administered 2015-06-28 – 2015-07-01 (×3): 237 mL via ORAL

## 2015-06-28 MED ORDER — IOPAMIDOL (ISOVUE-300) INJECTION 61%
INTRAVENOUS | Status: AC
  Administered 2015-06-28: 75 mL
  Filled 2015-06-28: qty 75

## 2015-06-28 NOTE — Progress Notes (Signed)
Family Medicine Teaching Service Daily Progress Note Intern Pager: [phone]  Patient name: [name] Medical record number: [id number] Date of birth: October 24, 1965 Age: 50 y.o. Gender: female  Primary Care Provider: Vance Gather, MD Consultants: Orthopedic Surgery, Medical Oncology, Radiation Oncology Code Status: Full  Pt Overview and Major Events to Date:  6/13: Admitted to FMTS after MRI of L hip showing widespread metastatic disease. 6/14: Underwent stabilization surgery of left proximal femur   Assessment and Plan: [name] is a 50 y.o. female presenting with widespread metastatic disease. PMH is significant for hx breast cancer s/p R sided mastectomy/chemo/radiation in 1999, congenital deafness, depression, anxiety.   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 Pt underwent MRI of left hip today for evaluation of left hip pain. MRI showed widespread bony metastatic disease with numerous foci of tumor throughout the bony pelvis and femurs.  - Pt's oncologist following. CEA: 9.1 (H), cancer antigen 27.29: 72.5 (H), Cancer antigen 15-3: 59.1 (H) -->  - Radiation oncology consulted. Anticipate starting radiation therapy ~2 weeks post op as either an inpatient or outpatient. - Orthopedic surgery consulted. S/p stabilization of L femur and biopsy of metastatic tissue. Up with therapy and WBAT.  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 (Hgb was 11.8 two weeks ago). May be secondary to anemia of chronic disease or Pt may have some bone marrow involvement and  now with drop post-op.  - Daily CBCs - Transfusion threshold of 7 or if symptomatic  Poor PO: Patient says she is having trouble with nausea and dislikes the hospital food.  - Zofran and Reglan for nausea - Ordered chocolate Ensure  Constipation: Has not had BM for 3 days.  - Miralax 17 mg daily ordered. Increase as needed.  - Colace 100 mg BID  GERD: Takes TUMS at home.  - Pepcid 10mg  bid - GI cocktail prn  Hx Depression and Anxiety: Not on any medications at home. Pt states she is overwhelmed by everything that has happened today. - Ativan 1mg  q4hrs prn for anxiety. - Will consider starting SSRI this admission - Consider palliative care consult or chaplain. Pt not interested at this time.  FEN/GI: Regular diet, SLIV Prophylaxis: Restarted VTE prophylaxis with lovenox 30 mg BID (given risk factors of metastatic disease and recent orthopedic surgery; consulted Dr. [name]' group 6/16 and Dr. Marlou Sa agreed this was reasonable)  Disposition: Pending pain control.  Subjective:  Pt states she is in a lot of pain this morning at site of left leg incision. She says pain not relieved until she receives all pain medicines available. She asked if she was "going to make it." She also complained that she was having difficulty eating because the hospital food does not taste good. She was open to trying a meal supplement like Ensure.   Objective: Temp:  [98.4 F (36.9 C)-99.3 F (37.4 C)] 99.3 F (37.4 C) (06/16 0522) Pulse Rate:  [73-105] 96 (06/16 0522) Resp:  [18-19] 19 (06/16 0522) BP: (90-124)/(45-56) 100/50 mmHg (06/16 0522) SpO2:  [94 %-96 %] 96 % (06/16 0522) Physical Exam: General: Tired-appearing, communicating  with sign language (interpreter present) Cardiovascular: RRR, no murmurs Respiratory: CTAB, normal work of breathing Abdomen: +BS, soft, non-distended, TTP of LLQ, no rebound, no guarding, no masses MSK: No edema or cyanosis Skin: Wound dressing in place over left lateral  thigh. Transverse scar across abdomen, no rashes Neuro: Awake, alert, oriented, CN 2-12 grossly intact, no focal deficits Psych: Flattened affect, appropriate behavior  Laboratory:  Recent Labs Lab 06/26/15 0340 06/27/15 0436 06/28/15 0645  WBC 5.2 6.0 6.8  HGB 10.5* 9.5* 8.3*  HCT 32.3* 28.5* 25.7*  PLT 259 249 239    Recent Labs Lab 06/25/15 1412 06/26/15 0340 06/27/15 0436 06/28/15 0645  NA 139 137 134* 138  K 3.5 4.0 4.3 3.5  CL 104 101 100* 101  CO2 27 29 24 29   BUN 8 9 11 9   CREATININE 0.87 0.94 0.88 0.88  CALCIUM 9.3 9.3 8.9 8.6*  PROT 6.5 6.7  --   --   BILITOT 1.1 1.1  --   --   ALKPHOS 74 73  --   --   ALT 11* 11*  --   --   AST 16 13*  --   --   GLUCOSE 84 97 156* 105*    Imaging/Diagnostic Tests: - MRI L hip (6/13): Widespread bony metastatic disease with numerous foci of tumor throughout the bony pelvis and femurs. Confluent tumor in the left proximal femoral intertrochanteric region in shaft, where the medullary space is completely replaced by tumor over a 15 cm excursion, and where there some anterior bony cortical destruction, periosteal reaction, and possibly early periosteal extension of tumor along the proximal tibial metaphysis and shaft especially anteriorly. Low-level edema in the adjacent vastus intermedius and iliopsoas musculature in this area. In general the various tumor deposits are occult and/or highly inconspicuous on CT ; given the extensive tumor in the bony pelvis and hips, I would expect similar widespread osseous metastatic disease elsewhere in the axial and appendicular skeleton. Further workup with whole-body bone scan, nuclear medicine PET-CT, and/or MRI workup may be warranted. - X-ray cervical spine: Possible metastatic lesions in the left transverse process at T1 and the posterior 4th rib. - CXR: possible expansile lesion in the left posterior 4th rib - X-ray left humerus: No focal bone lesions - X-ray right humerus: No focal bone  lesions - X-ray right tib/fib: No focal bone lesions - X-ray left tib/fib: No focal bone lesions  Rogue Bussing, MD 06/28/2015, 9:55 AM PGY-1, [place] Intern pager: [phone], text pages welcome

## 2015-06-28 NOTE — Progress Notes (Signed)
FMTS ATTENDING ADMISSION NOTE Debra Tremper,MD I  have seen and examined this patient, reviewed their chart. I have discussed this patient with the resident.  Patient seen this morning. Her main complaint was pain which improved on IV pain meds. I agree with Dilaudid IV. Taper down/ off as tolerated. PT to work with patient per orthopedic recommendation. She will likely benefit from short term rehab when stable. Start DVT prophylaxis ( Enoxaparin). Orthopedic and PT to see today.

## 2015-06-28 NOTE — Telephone Encounter (Signed)
Medical Oncology  Spoke with [facility] pathology, may have some information later today. Discussed history with Dr Saralyn Pilar now.  Godfrey Pick, MD

## 2015-06-28 NOTE — Progress Notes (Signed)
Physical Therapy Treatment Patient Details Name: LOY PANTHER MRN: [id number] DOB: [birth year] Today's Date: 06/28/2015    History of Present Illness 49 y.o. 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 Pt now s/p Biopsy of metastatic tissue and stabilization with trochanteric nail. PMH: deaf, breast cancer, anxiety, depression.     PT Comments    Pt performed increased gait.  Pt remains guarded.  Pt would benefit from continued stay to improve mobility and address stair training.    Follow Up Recommendations  Home health PT;Supervision for mobility/OOB     Equipment Recommendations  Rolling walker with 5" wheels    Recommendations for Other Services       Precautions / Restrictions Precautions Precautions: Fall Restrictions Weight Bearing Restrictions: Yes LLE Weight Bearing: Weight bearing as tolerated    Mobility  Bed Mobility Overal bed mobility: Needs Assistance Bed Mobility: Supine to Sit     Supine to sit: Min assist;Mod assist     General bed mobility comments: Pt required min-mod assist to advance to edge of bed while PTA assisted in scooting and supporting LLE.  Pt moves with guarded motions.    Transfers Overall transfer level: Needs assistance Equipment used: Rolling walker (2 wheeled) Transfers: Sit to/from Stand Sit to Stand: Min assist Stand pivot transfers: Min assist       General transfer comment: Pt required cues for hand placement and pivoting to toilet to urinate.  Pt slow to ascend and descend.    Ambulation/Gait Ambulation/Gait assistance: Min guard Ambulation Distance (Feet): 16 Feet Assistive device: Rolling walker (2 wheeled) Gait Pattern/deviations: Step-to pattern;Antalgic;Decreased stride length;Shuffle   Gait velocity interpretation: Below normal speed for age/gender General Gait Details: Cues for sequencing and RW safety.  Pt gait limited due  to nausea.  Pt extremely painful during gait sequencing.     Stairs            Wheelchair Mobility    Modified Rankin (Stroke Patients Only)       Balance Overall balance assessment: Needs assistance   Sitting balance-Leahy Scale: Good       Standing balance-Leahy Scale: Poor                      Cognition Arousal/Alertness: Awake/alert Behavior During Therapy: Anxious Overall Cognitive Status: Within Functional Limits for tasks assessed                      Exercises Total Joint Exercises Ankle Circles/Pumps: AROM;10 reps;Supine;Both Quad Sets: Left;10 reps;Supine;AROM Heel Slides: AAROM;Left;10 reps;Supine Hip ABduction/ADduction: AAROM;Left;10 reps;Supine Straight Leg Raises: AAROM;Left;10 reps;Supine    General Comments        Pertinent Vitals/Pain Pain Assessment: 0-10 Pain Score: 8  Pain Location: L hip Pain Descriptors / Indicators: Discomfort;Guarding;Grimacing;Burning;Throbbing Pain Intervention(s): Monitored during session;Repositioned;Ice applied    Home Living                      Prior Function            PT Goals (current goals can now be found in the care plan section) Acute Rehab PT Goals Patient Stated Goal: be safe and mobile Potential to Achieve Goals: Good Progress towards PT goals: Progressing toward goals    Frequency  Min 5X/week    PT Plan      Co-evaluation  End of Session Equipment Utilized During Treatment: Gait belt Activity Tolerance: Patient limited by fatigue;Patient limited by pain Patient left: in chair;with call bell/phone within reach;with family/visitor present     Time: [id number] PT Time Calculation (min) (ACUTE ONLY): 34 min  Charges:  $Gait Training: 8-22 mins $Therapeutic Exercise: 8-22 mins                    G Codes:      Cristela Blue 07/19/2015, 10:45 AM  Governor Rooks, PTA pager [phone]

## 2015-06-28 NOTE — Care Management Note (Signed)
Case Management Note  Patient Details  Name: [name] MRN: [id number] Date of Birth: 1965/10/21  Subjective/Objective:                    Action/Plan:  Discussed home health with patient . She has Medicaid . She has a non qualifying Medicaid DX  For HHPT , however, [facility] is willing to accept referral and do a home assessment and then submit to [facility] for more [place] visits . Explained same to patient and visitor via interpreter. Interpreter stated patient understood.   Patient confirmed face sheet information , she lives with her sister and brother . Her daughter [name] will be staying with her to assist.   [facility] can arrange an interpreter through [facility] 336 [phone]       Expected Discharge Date:                  Expected Discharge Plan:  [place]  In-House Referral:     Discharge planning Services  CM Consult  Post Acute Care Choice:  Durable Medical Equipment, Home Health Choice offered to:  Patient  DME Arranged:  Tub bench, Walker rolling DME Agency:  [place]:  PT, OT [facility] Agency:     Status of Service:  In process, will continue to follow  Medicare Important Message Given:    Date Medicare IM Given:    Medicare IM give by:    Date Additional Medicare IM Given:    Additional Medicare Important Message give by:     If discussed at [place] of Stay Meetings, dates discussed:    Additional Comments:  Marilu Favre, RN 06/28/2015, 8:27 AM

## 2015-06-28 NOTE — Progress Notes (Signed)
MEDICAL ONCOLOGY June 28, 2015, 10:39 AM  Hospital day 4 Antibiotics: none Chemotherapy: none since adjuvant 1999 Post op day 2 left femur nail  I have spoken with Dr Claudette Laws pathology prior to visit this AM.  Preliminary of surgical path shows metastatic carcinoma. Immunohistochemical stains pending, still most likely breast primary but needs immunohistochemistry to confirm.  EMR reviewed.  Patient seen with mother here, with assistance of sign language interpreter. Mother is not deaf.  Subjective: Awake but a little drowsy from medication. Walked just in room with PT, back to bed with pain LLE, some nausea and felt weak. This was first time that she has been OOB since MRI scan 06-25-15. No bowel movement since admission, uncomfortable from that, on dilaudid and has had no prn miralax. I have changed miralax to daily now (may need to increase to bid if not sufficient). Denies other pain. Denies SOB. Has had minimal po due to some nausea and constipation, is willing to try supplements. Is voiding.   ONCOLOGIC HISTORY Patient had 1.4 cm poorly differentiated right breast carcinoma diagnosed in Nov 1999 at age 50, 1/7 axillary nodes involved, ER/PR and HER 2 positive. She had mastectomy with the 7 axillary node evaluation, 4 cycles of adriamycin/ cytoxan followed by taxotere, then five years of tamoxifen thru June 2005 (no herceptin in 1999). She had right TRAM reconstruction. She had bilateral salpingoophorectomy in May 2005. She was briefly on aromatase inhibitor after tamoxifen, but discontinued this herself due to poor tolerance. Left mammogram was done as screening on 04-15-2012 with heterogeneously dense tissue and questioned asymmetry, followed by diagnostic left mamogram on 04-28-12 which had no findings of concern; she had no further mammograms until 03-15-2015 to[facility] at [facility] with heterogeneously dense breast tissue but no other findings of concern. Genetics testing was negative  by Breast Ovarian panel 03-2015.    Objective: Vital signs in last 24 hours: Blood pressure 100/50, pulse 96, temperature 99.3 F (37.4 C), temperature source Oral, resp. rate 19, height _0  (1.702 m), weight 136 lb 9 oz (61.944 kg), SpO2 96 %.   Intake/Output from previous day: 06/15 0701 - 06/16 0700 In: 2359.7 [P.O.:1200; I.V.:1159.7] Out: 2400 [Urine:2400] Intake/Output this shift:    Physical exam: awake, alert, looks moderately uncomfortable adjusting ice pack to left femur. Pale, not icteric. Oral mucosa clear. PERRL. Respirations not labored RA. Lungs clear anteriorly. Peripheral IV lower left forearm site ok. Abdomen soft, slightly distended, quiet. LE incision clean and dry, no swelling LLE, feet warm, no swelling RLE. SCDs not on presently. Feet warm. No focal neuro deficits other than deafness on bed exam. Communicates easily and quickly with assistance of sign language interpreter  Lab Results:  Recent Labs  06/27/15 0436 06/28/15 0645  WBC 6.0 6.8  HGB 9.5* 8.3*  HCT 28.5* 25.7*  PLT 249 239   BMET  Recent Labs  06/27/15 0436 06/28/15 0645  NA 134* 138  K 4.3 3.5  CL 100* 101  CO2 24 29  GLUCOSE 156* 105*  BUN 11 9  CREATININE 0.88 0.88  CALCIUM 8.9 8.6*    Iron studies ordered with AM labs Reticulocyte count add on to CBC today.  Studies/Results: Dg C-arm 1-60 Min  06/26/2015  CLINICAL DATA:  Breast cancer. Left femur metastasis. Left femoral nail placement. EXAM: DG C-ARM 61-120 MIN; LEFT FEMUR 2 VIEWS COMPARISON:  06/26/2015 left femur radiographs. FINDINGS: Fluoroscopy time 57 seconds. Five nondiagnostic spot fluoroscopic intraoperative radiographs of the left femur demonstrate  postsurgical changes from placement of intramedullary rod in the left femoral shaft with interlocking left femoral neck pin and distal interlocking screws. IMPRESSION: Intraoperative fluoroscopic guidance for intramedullary rod placement in the left femoral shaft.  Electronically Signed   By: Ilona Sorrel M.D.   On: 06/26/2015 17:50   Dg Femur Min 2 Views Left  06/26/2015  CLINICAL DATA:  Breast cancer. Left femur metastasis. Left femoral nail placement. EXAM: DG C-ARM 61-120 MIN; LEFT FEMUR 2 VIEWS COMPARISON:  06/26/2015 left femur radiographs. FINDINGS: Fluoroscopy time 57 seconds. Five nondiagnostic spot fluoroscopic intraoperative radiographs of the left femur demonstrate postsurgical changes from placement of intramedullary rod in the left femoral shaft with interlocking left femoral neck pin and distal interlocking screws. IMPRESSION: Intraoperative fluoroscopic guidance for intramedullary rod placement in the left femoral shaft. Electronically Signed   By: Ilona Sorrel M.D.   On: 06/26/2015 17:50   DISCUSSION:   Pathology findings discussed with patient and mother. They understand that cancer has spread to bone and that breast primary appears most likely from morphology, tho additional pathology testing will be done over next several days to clarify this.  Have reviewed findings on MRI left hip/ pelvis, CT AP and plain xrays, as had been discussed earlier with patient. Will get CT chest while in hospital, and needs PET after DC to best evaluate bones. I will set up the PET after DC.   Discussed beginning IV bisphosphonate in hospital for metastatic cancer to bone. Discussed with [facility] pharmacist, confirmed dose and inpatient use of this. Plan to continue q 4 weeks, likely with zometa outpatient.   I have told patient that metastatic cancer to bone cannot be cured, but that we will treat to improve situation and hopefully for control. Systemic treatment will depend on full pathology information (other than IV bisphosphonate). Immediate priority is to recover from orthopedic surgery, timing of DC per Dr [name] and primary service. She will have consultation with Dr Sondra Come either in hospital or at radiation oncology as outpatient, and expect radiation to left hip/  femur with possible treatment other areas if symptomatic. Systemic treatment will be discussed when I see her at [facility] on 07-12-15.   I will let Dr Sondra Come know path information as above.  Encouraged as good nutrition as possible, try supplements.  Address constipation Progressive anemia may reflect marrow replacement with metastatic cancer. Iron and retic pending. Would transfuse if <~ 8.0 or symptomatic, to avoid problems with this as we begin additional outpatient treatment.      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  Initial pathology confirmation of carcinoma, morphology suggests breast primary, with immunohistochemical information pending. Post nail to impending fracture left femur 06-26-15 Dr [name].  Radiation oncology Dr Sondra Come  aware and will assist. CT chest to be done in hospital, and will set up PET after DC. IV bisphosphonate today.  2.right breast cancer T1 N1 (1 of 7 nodes) ER PR + and HER 2 + in 1999 at age 52. Treated with mastectomy/TRAM reconstruction and 7 node right axillary evaluation, adriamycin cytoxan x 4 followed by taxol, 5 years of tamoxifen (no HER 2 therapy in 1999). She also had oophorectomy 2005, was intolerant of aromatase inhibitor. BRCA testing negative 03-2015.  3.I am not clear that she became menopausal with chemo, but did have oophorectomy 05-2003, which was done particularly for  complaints of severe abdominal pain. Bone density scan 03-14-15 osteopenia, lowest T score -1.8 4.decrease in hemoglobin over last few weeks, continues to drop without obvious bleeding, hgb now 8.3. MCV normal.  May reflect extensive bone marrow involvement. Iron studies and retic ordered. Would transfuse if <8.0 or symptomatic, as this will help outpatient management as other treatment is begun. 5.constipation with pain meds, bedrest  etc. Need to increase laxatives, miralax as above. 6.congenital deafness, sign language interpreters extremely helpful this admission  7.positional vertigo followed by neurology. I believe she does not tolerate lying on stomach (as for breast MRI) 8..post laparoscopic cholecystectomy 2004    I will be away after today until 07-08-15. I will ask my partner to check on her this weekend with progressive anemia - thank you  I will see her back at [facility] on 07-12-15, that appointment in EMR. Please page me if questions today [phone], otherwise med onc on call.   Kaiyu Mirabal P

## 2015-06-28 NOTE — Progress Notes (Signed)
Subjective: 2 Days Post-Op Procedure(s) (LRB): LEFT BIOMET LONG AFFIXS NAIL (Left) Patient reports pain as moderate.    Objective: Vital signs in last 24 hours: Temp:  [98.5 F (36.9 C)-99.3 F (37.4 C)] 99.3 F (37.4 C) (06/16 0522) Pulse Rate:  [95-105] 96 (06/16 0522) Resp:  [18-19] 19 (06/16 0522) BP: (90-124)/(45-55) 100/50 mmHg (06/16 0522) SpO2:  [95 %-96 %] 96 % (06/16 0522)  Intake/Output from previous day: 06/15 0701 - 06/16 0700 In: 2359.7 [P.O.:1200; I.V.:1159.7] Out: 2400 [Urine:2400] Intake/Output this shift:     Recent Labs  06/25/15 1412 06/26/15 0340 06/27/15 0436 06/28/15 0645  HGB 10.7* 10.5* 9.5* 8.3*    Recent Labs  06/27/15 0436 06/28/15 0645  WBC 6.0 6.8  RBC 3.39* 2.98*  HCT 28.5* 25.7*  PLT 249 239    Recent Labs  06/27/15 0436 06/28/15 0645  NA 134* 138  K 4.3 3.5  CL 100* 101  CO2 24 29  BUN 11 9  CREATININE 0.88 0.88  GLUCOSE 156* 105*  CALCIUM 8.9 8.6*   No results for input(s): LABPT, INR in the last 72 hours.  Neurologically intact  Assessment/Plan: 2 Days Post-Op Procedure(s) (LRB): LEFT BIOMET LONG AFFIXS NAIL (Left) Up with therapy   WBAT  Jayse Hodkinson C 06/28/2015, 11:46 AM

## 2015-06-28 NOTE — Telephone Encounter (Signed)
Follow up appt made per LL 6/16 pof. appt to be printed once patient is discharged from hospital

## 2015-06-29 DIAGNOSIS — C799 Secondary malignant neoplasm of unspecified site: Secondary | ICD-10-CM

## 2015-06-29 DIAGNOSIS — K5903 Drug induced constipation: Secondary | ICD-10-CM | POA: Insufficient documentation

## 2015-06-29 DIAGNOSIS — C50919 Malignant neoplasm of unspecified site of unspecified female breast: Secondary | ICD-10-CM

## 2015-06-29 DIAGNOSIS — N946 Dysmenorrhea, unspecified: Secondary | ICD-10-CM

## 2015-06-29 LAB — CBC
HEMATOCRIT: 25.2 % — AB (ref 36.0–46.0)
HEMOGLOBIN: 8.3 g/dL — AB (ref 12.0–15.0)
MCH: 28.3 pg (ref 26.0–34.0)
MCHC: 32.9 g/dL (ref 30.0–36.0)
MCV: 86 fL (ref 78.0–100.0)
Platelets: 221 10*3/uL (ref 150–400)
RBC: 2.93 MIL/uL — ABNORMAL LOW (ref 3.87–5.11)
RDW: 12.6 % (ref 11.5–15.5)
WBC: 6.3 10*3/uL (ref 4.0–10.5)

## 2015-06-29 LAB — BASIC METABOLIC PANEL
ANION GAP: 7 (ref 5–15)
BUN: 6 mg/dL (ref 6–20)
CO2: 27 mmol/L (ref 22–32)
Calcium: 8.4 mg/dL — ABNORMAL LOW (ref 8.9–10.3)
Chloride: 100 mmol/L — ABNORMAL LOW (ref 101–111)
Creatinine, Ser: 0.74 mg/dL (ref 0.44–1.00)
GFR calc non Af Amer: >60 mL/min (ref >60)
Glucose, Bld: 105 mg/dL — ABNORMAL HIGH (ref 65–99)
POTASSIUM: 3.6 mmol/L (ref 3.5–5.1)
SODIUM: 134 mmol/L — AB (ref 135–145)

## 2015-06-29 LAB — IRON AND TIBC
Iron: 16 ug/dL — ABNORMAL LOW (ref 28–170)
Saturation Ratios: 7 % — ABNORMAL LOW (ref 10.4–31.8)
TIBC: 242 ug/dL — ABNORMAL LOW (ref 250–450)
UIBC: 226 ug/dL

## 2015-06-29 LAB — TSH: TSH: 1.184 u[IU]/mL (ref 0.350–4.500)

## 2015-06-29 MED ORDER — SENNOSIDES-DOCUSATE SODIUM 8.6-50 MG PO TABS
1.0000 | ORAL_TABLET | Freq: Two times a day (BID) | ORAL | Status: DC
  Administered 2015-06-29 (×2): 1 via ORAL
  Filled 2015-06-29 (×2): qty 1

## 2015-06-29 MED ORDER — SODIUM CHLORIDE 0.9 % IV SOLN
510.0000 mg | Freq: Once | INTRAVENOUS | Status: AC
  Administered 2015-06-29: 510 mg via INTRAVENOUS
  Filled 2015-06-29: qty 17

## 2015-06-29 MED ORDER — METHYLPREDNISOLONE SODIUM SUCC 125 MG IJ SOLR
80.0000 mg | Freq: Once | INTRAMUSCULAR | Status: AC
  Administered 2015-06-29: 80 mg via INTRAVENOUS
  Filled 2015-06-29: qty 2

## 2015-06-29 MED ORDER — ZOLEDRONIC ACID 4 MG/5ML IV CONC
4.0000 mg | Freq: Once | INTRAVENOUS | Status: AC
  Administered 2015-06-29: 4 mg via INTRAVENOUS
  Filled 2015-06-29: qty 5

## 2015-06-29 MED ORDER — POLYETHYLENE GLYCOL 3350 17 G PO PACK
17.0000 g | PACK | Freq: Two times a day (BID) | ORAL | Status: DC
  Administered 2015-06-29 – 2015-06-30 (×3): 17 g via ORAL
  Filled 2015-06-29 (×5): qty 1

## 2015-06-29 NOTE — Progress Notes (Signed)
Mrs. [name] is feeling okay this morning. We communicated via writing. She is alert. She has a little bit of discomfort in the left hip where she had her surgery. She is out of bed.  She has a little bit of nausea but there is no vomiting.  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 One has to suspect this to be breast cancer. She has a history of eye think right breast cancer back in 1999.  Her CA 27.29 is elevated.  Showed a CT of the chest yesterday. Shows extensive right internal chain memory lymphadenopathy. There is no parenchymal organ involvement of the lungs. Her visualize liver looks okay.  Her hemoglobin is low but stable. Her corrected reticulocyte count is quite low. She is clearly iron deficient. I will going give her a dose of Feraheme. Her iron saturations only 7%.  Her electrolytes look pretty good. Her renal function is okay.  On her physical exam, her vital signs are all stable. She is slightly febrile temperature 99.6. Her pulse is 83. Blood pressure 122/65. Her lungs are clear bilaterally. Cardiac exam regular rate and rhythm with no murmurs, rubs or bruits. Abdomen is soft. She has good bowel sounds. There is no fluid wave. There is no palpable liver or spleen tip. Back exam shows no tenderness over the spine, ribs or hips. She has the surgical incision in the lateral left hip. The incision site/dressing looks dry. On her extremity is, she seems to have fairly decent range of motion of the lower extremities. Neurological exam is nonfocal.  While she is hospitalized, I will get a bone scan on her. This can help [place] identify any other areas of metastatic disease.  I will also go ahead and give her a dose of Zometa. I think this be reasonable for her as an inpatient. As an out patient, she can probably get Xgeva.  She does have a strong faith. We did share some good fellowship. She is very nice.  [name]

## 2015-06-29 NOTE — Progress Notes (Signed)
Family Medicine Teaching Service Daily Progress Note Intern Pager: [phone]  Patient name: [name] Medical record number: [id number] Date of birth: 05-Aug-1965 Age: 50 y.o. Gender: female  Primary Care Provider: Vance Gather, MD Consultants: Orthopedic Surgery, Medical Oncology, Radiation Oncology Code Status: Full  Pt Overview and Major Events to Date:  6/13: Admitted to FMTS after MRI of L hip showing widespread metastatic disease. 6/14: Underwent stabilization surgery of left proximal femur   Assessment and Plan: [name] is a 50 y.o. female presenting with widespread metastatic disease. PMH is significant for hx breast cancer s/p R sided mastectomy/chemo/radiation in 1999, congenital deafness, depression, anxiety.   Widespread bony metastatic disease of the pelvis/femurs with hx of breast cancer: Surgical pathology showed adenocarcinoma, likely from the breast. Chest CT (6/16) yesterday showed extensive right internal mammary chain lymphadenopathy consistent with metastatic breast cancer. - Oncology following. Will get bone scan while hospitalized and give Zoledronate x 1.   - Radiation oncology consulted. Anticipate starting radiation therapy ~2 weeks post op as either an inpatient or outpatient. - Orthopedic surgery consulted. S/p stabilization of L femur and biopsy of metastatic tissue. Up with therapy and WBAT.  - PT recommended HH PT and rolling walker - Follow-up surgical path specimens --> metastatic adenocarcinoma (immunohistochemistry needed to confirm origin) - Dilaudid 0.5-1mg  q3hrs prn and OxyIR 5-10mg  q6hrs prn for pain. - Vitals per unit routine  Normocytic Anemia: Hgb 10.7 on admission > 10.5 > 9.5 > 8.3 > 8.3. (Hgb was 11.8 two weeks ago). May be secondary to anemia of chronic disease or Pt may have some bone marrow involvement and now with drop post-op.  - Daily CBCs - Transfusion threshold of 7 or if symptomatic  Poor PO: Patient says she is having  trouble with nausea and dislikes the hospital food.  - Zofran and Reglan for nausea - Ordered chocolate Ensure  Constipation: Has not had BM for 4 days.  - Miralax 17 mg daily. Will increase to bid. - Senokot-S bid  GERD: Takes TUMS at home.  - Pepcid 10mg  bid - GI cocktail prn  Hx Depression and Anxiety: Not on any medications at home. Pt states she is overwhelmed by everything that has happened today. - Ativan 1mg  q4hrs prn for anxiety. - Will consider starting SSRI this admission - Consider palliative care consult or chaplain. Pt not interested at this time.  FEN/GI: Regular diet, SLIV Prophylaxis: Lovenox 30 mg BID   Disposition: Pending pain control.  Subjective:  Pt states she is in a lot of pain this morning. She has been able to get up out of bed briefly to go to the bathroom, but has a lot of pain whenever she gets up. She is also nauseated.  Objective: Temp:  [99.4 F (37.4 C)-99.6 F (37.6 C)] 99.6 F (37.6 C) (06/17 0500) Pulse Rate:  [83-100] 83 (06/17 0500) Resp:  [19] 19 (06/17 0500) BP: (111-122)/(57-65) 122/65 mmHg (06/17 0500) SpO2:  [96 %] 96 % (06/17 0500) Physical Exam: General: Tired-appearing, communicating with sign language (interpreter present) Cardiovascular: RRR, no murmurs Respiratory: CTAB, normal work of breathing Abdomen: +BS, soft, non-distended, non-tender MSK: No edema or cyanosis Skin: Wound dressing in place over left lateral thigh. Transverse scar across abdomen, no rashes Neuro: Awake, alert, oriented, CN 2-12 grossly intact, no focal deficits Psych: Flattened affect, appropriate behavior  Laboratory:  Recent Labs Lab 06/27/15 0436 06/28/15 0645 06/29/15 0330  WBC 6.0 6.8 6.3  HGB 9.5* 8.3* 8.3*  HCT 28.5* 25.7*  25.2*  PLT 249 239 221    Recent Labs Lab 06/25/15 1412 06/26/15 0340 06/27/15 0436 06/28/15 0645 06/29/15 0330  NA 139 137 134* 138 134*  K 3.5 4.0 4.3 3.5 3.6  CL 104 101 100* 101 100*  CO2 27 29 24 29  27   BUN 8 9 11 9 6   CREATININE 0.87 0.94 0.88 0.88 0.74  CALCIUM 9.3 9.3 8.9 8.6* 8.4*  PROT 6.5 6.7  --   --   --   BILITOT 1.1 1.1  --   --   --   ALKPHOS 74 73  --   --   --   ALT 11* 11*  --   --   --   AST 16 13*  --   --   --   GLUCOSE 84 97 156* 105* 105*    Imaging/Diagnostic Tests: - MRI L hip (6/13): Widespread bony metastatic disease with numerous foci of tumor throughout the bony pelvis and femurs. Confluent tumor in the left proximal femoral intertrochanteric region in shaft, where the medullary space is completely replaced by tumor over a 15 cm excursion, and where there some anterior bony cortical destruction, periosteal reaction, and possibly early periosteal extension of tumor along the proximal tibial metaphysis and shaft especially anteriorly. Low-level edema in the adjacent vastus intermedius and iliopsoas musculature in this area. In general the various tumor deposits are occult and/or highly inconspicuous on CT ; given the extensive tumor in the bony pelvis and hips, I would expect similar widespread osseous metastatic disease elsewhere in the axial and appendicular skeleton. Further workup with whole-body bone scan, nuclear medicine PET-CT, and/or MRI workup may be warranted. - X-ray cervical spine: Possible metastatic lesions in the left transverse process at T1 and the posterior 4th rib. - CXR: possible expansile lesion in the left posterior 4th rib - X-ray left humerus: No focal bone lesions - X-ray right humerus: No focal bone lesions - X-ray right tib/fib: No focal bone lesions - X-ray left tib/fib: No focal bone lesions  [name], MD 06/29/2015, 7:17 AM PGY-1, [place] Intern pager: [phone], text pages welcome

## 2015-06-29 NOTE — Progress Notes (Signed)
Physical Therapy Treatment Patient Details Name: [name] MRN: [id number] DOB: Nov 06, 1965 Today's Date: 06/29/2015    History of Present Illness 50 y.o. 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 Pt now s/p Biopsy of metastatic tissue and stabilization with trochanteric nail. PMH: deaf, breast cancer, anxiety, depression.     PT Comments    Pt progressing towards physical therapy goals. Was able to perform transfers and ambulation with min assist and pt demonstrated x2 LOB this session. Pt required min assist to prevent fall. Pt/husband were educated on need for staff assist during OOB for safety. Stair training deferred to next session due to reports of increased dizziness with standing activity. Will continue to follow and progress as able per POC.   Follow Up Recommendations  Home health PT;Supervision for mobility/OOB     Equipment Recommendations  Rolling walker with 5" wheels    Recommendations for Other Services       Precautions / Restrictions Precautions Precautions: Fall Restrictions Weight Bearing Restrictions: Yes LLE Weight Bearing: Weight bearing as tolerated    Mobility  Bed Mobility Overal bed mobility: Needs Assistance Bed Mobility: Supine to Sit     Supine to sit: Min assist     General bed mobility comments: Pt required min assist to advance to EOB. Increased cues to scoot all the way out so feet were on the floor before attempting to stand.   Transfers Overall transfer level: Needs assistance Equipment used: Rolling walker (2 wheeled) Transfers: Sit to/from Stand Sit to Stand: Min assist         General transfer comment: VC's for hand placement on seated surface for safety.   Ambulation/Gait Ambulation/Gait assistance: Min assist Ambulation Distance (Feet): 100 Feet Assistive device: Rolling walker (2 wheeled) Gait Pattern/deviations: Decreased  stride length;Step-to pattern;Antalgic Gait velocity: Decreased Gait velocity interpretation: Below normal speed for age/gender General Gait Details: VC's for improved posture and sequencing/safety with the RW. Pt reports dizziness but was motivated for distance. x2 posterior LOB noted and min assist was required to recover.    Stairs            Wheelchair Mobility    Modified Rankin (Stroke Patients Only)       Balance Overall balance assessment: Needs assistance Sitting-balance support: Feet supported;No upper extremity supported Sitting balance-Leahy Scale: Good     Standing balance support: Bilateral upper extremity supported;During functional activity Standing balance-Leahy Scale: Poor                      Cognition Arousal/Alertness: Awake/alert Behavior During Therapy: WFL for tasks assessed/performed Overall Cognitive Status: Within Functional Limits for tasks assessed                      Exercises      General Comments        Pertinent Vitals/Pain Pain Assessment: Faces Faces Pain Scale: Hurts little more Pain Location: L hip Pain Descriptors / Indicators: Operative site guarding;Aching Pain Intervention(s): Limited activity within patient's tolerance;Monitored during session;Repositioned    Home Living                      Prior Function            PT Goals (current goals can now be found in the care plan section) Acute Rehab PT Goals Patient Stated Goal: be safe and mobile PT Goal  Formulation: With patient Time For Goal Achievement: 07/11/15 Potential to Achieve Goals: Good Progress towards PT goals: Progressing toward goals    Frequency  Min 5X/week    PT Plan Current plan remains appropriate    Co-evaluation             End of Session Equipment Utilized During Treatment: Gait belt Activity Tolerance: Patient limited by fatigue;Other (comment) (Limited by dizziness during standing activity) Patient  left: in chair;with call bell/phone within reach;with nursing/sitter in room;with family/visitor present;Other (comment) (Interpreter present)     Time: [id number] PT Time Calculation (min) (ACUTE ONLY): 27 min  Charges:  $Gait Training: 23-37 mins                    G Codes:      Rolinda Roan 2015-07-04, 12:12 PM   Rolinda Roan, PT, DPT Acute Rehabilitation Services Pager: [phone]

## 2015-06-29 NOTE — Progress Notes (Signed)
Interpreter will be present tomorrow from 0800-1000.

## 2015-06-30 LAB — BASIC METABOLIC PANEL
Anion gap: 8 (ref 5–15)
BUN: 7 mg/dL (ref 6–20)
CALCIUM: 8.1 mg/dL — AB (ref 8.9–10.3)
CHLORIDE: 108 mmol/L (ref 101–111)
CO2: 24 mmol/L (ref 22–32)
CREATININE: 0.76 mg/dL (ref 0.44–1.00)
GFR calc Af Amer: >60 mL/min (ref >60)
GFR calc non Af Amer: >60 mL/min (ref >60)
Glucose, Bld: 145 mg/dL — ABNORMAL HIGH (ref 65–99)
Potassium: 3.4 mmol/L — ABNORMAL LOW (ref 3.5–5.1)
SODIUM: 140 mmol/L (ref 135–145)

## 2015-06-30 LAB — CBC
HCT: 23.4 % — ABNORMAL LOW (ref 36.0–46.0)
HEMOGLOBIN: 7.8 g/dL — AB (ref 12.0–15.0)
MCH: 28.1 pg (ref 26.0–34.0)
MCHC: 33.3 g/dL (ref 30.0–36.0)
MCV: 84.2 fL (ref 78.0–100.0)
PLATELETS: 228 10*3/uL (ref 150–400)
RBC: 2.78 MIL/uL — ABNORMAL LOW (ref 3.87–5.11)
RDW: 12.2 % (ref 11.5–15.5)
WBC: 8.4 10*3/uL (ref 4.0–10.5)

## 2015-06-30 MED ORDER — DIPHENHYDRAMINE HCL 50 MG/ML IJ SOLN: 12.5000 mg | Freq: Four times a day (QID) | INTRAMUSCULAR | Status: DC | PRN

## 2015-06-30 MED ORDER — NALOXONE HCL 0.4 MG/ML IJ SOLN: 0.4000 mg | INTRAMUSCULAR | Status: DC | PRN

## 2015-06-30 MED ORDER — HYDROMORPHONE 1 MG/ML IV SOLN
INTRAVENOUS | Status: DC
  Filled 2015-06-30: qty 25

## 2015-06-30 MED ORDER — SODIUM CHLORIDE 0.9% FLUSH
9.0000 mL | INTRAVENOUS | Status: DC | PRN
Start: 2015-06-30 — End: 2015-06-30

## 2015-06-30 MED ORDER — OXYCODONE HCL 5 MG PO TABS: 5.0000 mg | ORAL_TABLET | Freq: Four times a day (QID) | ORAL | Status: DC | PRN

## 2015-06-30 MED ORDER — HYDROMORPHONE HCL 1 MG/ML IJ SOLN
0.5000 mg | INTRAMUSCULAR | Status: DC | PRN
  Administered 2015-07-01: 1 mg via INTRAVENOUS
  Filled 2015-06-30: qty 1

## 2015-06-30 MED ORDER — DIPHENHYDRAMINE HCL 12.5 MG/5ML PO ELIX: 12.5000 mg | ORAL_SOLUTION | Freq: Four times a day (QID) | ORAL | Status: DC | PRN

## 2015-06-30 MED ORDER — BISACODYL 10 MG RE SUPP
10.0000 mg | Freq: Once | RECTAL | Status: DC
  Filled 2015-06-30: qty 1

## 2015-06-30 MED ORDER — ONDANSETRON HCL 4 MG/2ML IJ SOLN: 4.0000 mg | Freq: Four times a day (QID) | INTRAMUSCULAR | Status: DC | PRN

## 2015-06-30 MED ORDER — SENNOSIDES-DOCUSATE SODIUM 8.6-50 MG PO TABS
2.0000 | ORAL_TABLET | Freq: Two times a day (BID) | ORAL | Status: DC
  Administered 2015-06-30 – 2015-07-02 (×4): 2 via ORAL
  Filled 2015-06-30 (×5): qty 2

## 2015-06-30 NOTE — Progress Notes (Signed)
Family Medicine Teaching Service Daily Progress Note Intern Pager: [phone]  Patient name: [name] Medical record number: [id number] Date of birth: [birth year] Age: 50 y.o. Gender: female  Primary Care Provider: Vance Gather, MD Consultants: Orthopedic Surgery, Medical Oncology, Radiation Oncology Code Status: Full  Pt Overview and Major Events to Date:  6/13: Admitted to [facility] after MRI of L hip showing widespread metastatic disease. 6/14: Underwent stabilization surgery of left proximal femur   Assessment and Plan: [name] is a 50 y.o. female presenting with widespread metastatic disease. PMH is significant for hx breast cancer s/p R sided mastectomy/chemo/radiation in 1999, congenital deafness, depression, anxiety.   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 Chest CT (6/16) showed extensive right internal mammary chain lymphadenopathy consistent with metastatic breast cancer. - Oncology following. Will get bone scan while hospitalized and give Zoledronate x 1.   - Radiation oncology consulted. Anticipate starting radiation therapy ~2 weeks post op as either an inpatient or outpatient. - Orthopedic surgery consulted. S/p stabilization of L femur and biopsy of metastatic tissue. Up with therapy and WBAT.  - PT recommended HH PT and rolling walker - Follow-up surgical path specimens --> metastatic adenocarcinoma (immunohistochemistry needed to confirm origin) - Pt feels like she keeps getting behind on her pain medications and has been in severe pain. Will start a Dilaudid PCA today. - Vitals per unit routine  Thyroid Nodule: Indeterminate right thyroid nodule seen on chest CT. TSH was normal. - Consider thyroid US - Can likely follow-up with this, as she has a lot of other things going on.   Normocytic Anemia: Hgb 10.7 on admission > 10.5 > 9.5 > 8.3 > 8.3  > 7.8. (Hgb was 11.8 two weeks ago). May be secondary to anemia of chronic disease or Pt may have some bone marrow involvement and now with drop post-op. Retic 2.4%. - Daily CBCs - Transfusion threshold of 7 or if symptomatic.  Poor PO: Patient says she is having trouble with nausea. - Zofran and Reglan for nausea - Ensure  Constipation: Has not had BM for 5 days. Pt states the Miralax is making her nauseated. - Senokot-S 2 tablets bid - Will try dulcolax suppository today. Can repeat as needed.  GERD: Takes TUMS at home.  - Pepcid 10mg  bid - GI cocktail prn  Hx Depression and Anxiety: Not on any medications at home. Pt states she is overwhelmed by everything that has happened today. - Ativan 1mg  q4hrs prn for anxiety. - Will consider starting SSRI this admission - Consider palliative care consult or chaplain. Pt not interested at this time.  FEN/GI: Regular diet, SLIV Prophylaxis: Lovenox 30 mg BID   Disposition: Pending pain control. Plan for discharge home with HHPT and rolling walker.  Subjective:  Pt states she is in a lot of pain. She spent most of the night crying in pain. She felt like it was taking a long time for the nurses to bring her pain medications. She also feels like the Miralax is making her nauseated. No other concerns this morning.  Objective: Temp:  [98.1 F (36.7 C)-98.6 F (37 C)] 98.5 F (36.9 C) (06/18 0611) Pulse Rate:  [88-96] 96 (06/18 0611) Resp:  [18-19] 19 (06/18 0611) BP: (102-107)/(53-61) 107/61 mmHg (06/18 0611) SpO2:  [96 %-98 %] 98 % (06/18 [id number]) Physical Exam: General: Tired-appearing, communicating with sign language (interpreter present), intermittently tearful. Cardiovascular: RRR, no murmurs  Respiratory: CTAB, normal work of breathing Abdomen: +BS, soft, non-distended, non-tender MSK: No edema or cyanosis Skin: Wound dressing in place over left lateral thigh. Transverse scar across abdomen, no rashes Neuro: Awake, alert, oriented,  CN 2-12 grossly intact, no focal deficits Psych: Flattened affect, appropriate behavior  Laboratory:  Recent Labs Lab 06/27/15 0436 06/28/15 0645 06/29/15 0330  WBC 6.0 6.8 6.3  HGB 9.5* 8.3* 8.3*  HCT 28.5* 25.7* 25.2*  PLT 249 239 221    Recent Labs Lab 06/25/15 1412 06/26/15 0340 06/27/15 0436 06/28/15 0645 06/29/15 0330  NA 139 137 134* 138 134*  K 3.5 4.0 4.3 3.5 3.6  CL 104 101 100* 101 100*  CO2 27 29 24 29 27   BUN 8 9 11 9 6   CREATININE 0.87 0.94 0.88 0.88 0.74  CALCIUM 9.3 9.3 8.9 8.6* 8.4*  PROT 6.5 6.7  --   --   --   BILITOT 1.1 1.1  --   --   --   ALKPHOS 74 73  --   --   --   ALT 11* 11*  --   --   --   AST 16 13*  --   --   --   GLUCOSE 84 97 156* 105* 105*    Imaging/Diagnostic Tests: - MRI L hip (6/13): Widespread bony metastatic disease with numerous foci of tumor throughout the bony pelvis and femurs. Confluent tumor in the left proximal femoral intertrochanteric region in shaft, where the medullary space is completely replaced by tumor over a 15 cm excursion, and where there some anterior bony cortical destruction, periosteal reaction, and possibly early periosteal extension of tumor along the proximal tibial metaphysis and shaft especially anteriorly. Low-level edema in the adjacent vastus intermedius and iliopsoas musculature in this area. In general the various tumor deposits are occult and/or highly inconspicuous on CT ; given the extensive tumor in the bony pelvis and hips, I would expect similar widespread osseous metastatic disease elsewhere in the axial and appendicular skeleton. Further workup with whole-body bone scan, nuclear medicine PET-CT, and/or MRI workup may be warranted. - X-ray cervical spine: Possible metastatic lesions in the left transverse process at T1 and the posterior 4th rib. - CXR: possible expansile lesion in the left posterior 4th rib - X-ray left humerus: No focal bone lesions - X-ray right humerus: No focal bone lesions -  X-ray right tib/fib: No focal bone lesions - X-ray left tib/fib: No focal bone lesions  [name], MD 06/30/2015, 8:59 AM PGY-1, [place] Intern pager: [phone], text pages welcome

## 2015-06-30 NOTE — Progress Notes (Signed)
RN set up patient's PCA pump. When RN told patient that she would need to hook her up to the CO2 monitor portion the patient refused. RN to page MD to get her back on her po meds and IV push medications.

## 2015-06-30 NOTE — Progress Notes (Signed)
Denies pain, and refusing Dulcolax suppos. at this time. Encouraged to call for pain med when needed.

## 2015-07-01 ENCOUNTER — Inpatient Hospital Stay (HOSPITAL_COMMUNITY): Payer: Medicaid Other

## 2015-07-01 ENCOUNTER — Telehealth: Payer: Self-pay | Admitting: Oncology

## 2015-07-01 LAB — CBC
HEMATOCRIT: 24.2 % — AB (ref 36.0–46.0)
HEMOGLOBIN: 7.8 g/dL — AB (ref 12.0–15.0)
MCH: 27.9 pg (ref 26.0–34.0)
MCHC: 32.2 g/dL (ref 30.0–36.0)
MCV: 86.4 fL (ref 78.0–100.0)
Platelets: 244 10*3/uL (ref 150–400)
RBC: 2.8 MIL/uL — AB (ref 3.87–5.11)
RDW: 12.6 % (ref 11.5–15.5)
WBC: 6.2 10*3/uL (ref 4.0–10.5)

## 2015-07-01 LAB — COMPREHENSIVE METABOLIC PANEL
ALBUMIN: 2.7 g/dL — AB (ref 3.5–5.0)
ALK PHOS: 66 U/L (ref 38–126)
ALT: 18 U/L (ref 14–54)
AST: 16 U/L (ref 15–41)
Anion gap: 6 (ref 5–15)
BILIRUBIN TOTAL: 1 mg/dL (ref 0.3–1.2)
BUN: <5 mg/dL — ABNORMAL LOW (ref 6–20)
CALCIUM: 8 mg/dL — AB (ref 8.9–10.3)
CO2: 26 mmol/L (ref 22–32)
CREATININE: 0.64 mg/dL (ref 0.44–1.00)
Chloride: 107 mmol/L (ref 101–111)
GFR calc Af Amer: >60 mL/min (ref >60)
GFR calc non Af Amer: >60 mL/min (ref >60)
GLUCOSE: 99 mg/dL (ref 65–99)
Potassium: 3.4 mmol/L — ABNORMAL LOW (ref 3.5–5.1)
SODIUM: 139 mmol/L (ref 135–145)
Total Protein: 5.8 g/dL — ABNORMAL LOW (ref 6.5–8.1)

## 2015-07-01 LAB — BASIC METABOLIC PANEL
ANION GAP: 9 (ref 5–15)
Anion gap: 8 (ref 5–15)
BUN: <5 mg/dL — ABNORMAL LOW (ref 6–20)
CHLORIDE: 106 mmol/L (ref 101–111)
CHLORIDE: 104 mmol/L (ref 101–111)
CO2: 23 mmol/L (ref 22–32)
CO2: 26 mmol/L (ref 22–32)
CREATININE: 0.65 mg/dL (ref 0.44–1.00)
Calcium: 7.8 mg/dL — ABNORMAL LOW (ref 8.9–10.3)
Calcium: 8.2 mg/dL — ABNORMAL LOW (ref 8.9–10.3)
Creatinine, Ser: 0.61 mg/dL (ref 0.44–1.00)
GFR calc Af Amer: >60 mL/min (ref >60)
GFR calc non Af Amer: >60 mL/min (ref >60)
GFR calc non Af Amer: >60 mL/min (ref >60)
GLUCOSE: 103 mg/dL — AB (ref 65–99)
Glucose, Bld: 85 mg/dL (ref 65–99)
POTASSIUM: 3.4 mmol/L — AB (ref 3.5–5.1)
Potassium: 3.1 mmol/L — ABNORMAL LOW (ref 3.5–5.1)
SODIUM: 138 mmol/L (ref 135–145)
Sodium: 138 mmol/L (ref 135–145)

## 2015-07-01 LAB — MAGNESIUM: MAGNESIUM: 2 mg/dL (ref 1.7–2.4)

## 2015-07-01 LAB — FERRITIN: Ferritin: 548 ng/mL — ABNORMAL HIGH (ref 11–307)

## 2015-07-01 MED ORDER — HYDROMORPHONE HCL 1 MG/ML IJ SOLN
0.5000 mg | INTRAMUSCULAR | Status: DC | PRN
  Administered 2015-07-01: 0.5 mg via INTRAVENOUS
  Filled 2015-07-01: qty 1

## 2015-07-01 MED ORDER — POTASSIUM CHLORIDE CRYS ER 20 MEQ PO TBCR
20.0000 meq | EXTENDED_RELEASE_TABLET | Freq: Two times a day (BID) | ORAL | Status: DC
  Administered 2015-07-01: 20 meq via ORAL
  Filled 2015-07-01 (×3): qty 1

## 2015-07-01 MED ORDER — HYDROCODONE-ACETAMINOPHEN 5-325 MG PO TABS
1.0000 | ORAL_TABLET | Freq: Four times a day (QID) | ORAL | Status: DC
Start: 2015-07-01 — End: 2015-07-02
  Administered 2015-07-01 – 2015-07-02 (×2): 1 via ORAL
  Filled 2015-07-01 (×4): qty 1

## 2015-07-01 MED ORDER — TECHNETIUM TC 99M MEDRONATE IV KIT
27.2000 | PACK | Freq: Once | INTRAVENOUS | Status: AC | PRN
  Administered 2015-07-01: 27.2 via INTRAVENOUS

## 2015-07-01 NOTE — Telephone Encounter (Signed)
Medical Oncology  Noted pamidronate 90 mg given 06-28-15 and zometa 4 mg given 06-29-15. Called pharmacy to confirm. Safety zone portal to be done by pharmacy. Requested pharmacy consult including recommendations for following calcium, which will need to be done in hospital until stablized,  and replacement if needed. Spoke with family practice primary service with information above. Labs from this AM noted, full CMET to be added now.  This MD away this week. Please call medical oncologist on call if additional assistance needed.  [phone] or amion.  Godfrey Pick, MD

## 2015-07-01 NOTE — Progress Notes (Signed)
According to pt. Med record, pt. has lasted up to 11-12 hours without requesting or needing pain med as is the case presently. The last pain med given was 1245 on 6/18 and pt. continues to deny pain and refuses pain med. Pt.'s major c/o remains constipation. Interpreter readily available 24/7 and is familiar with pt.

## 2015-07-01 NOTE — Progress Notes (Signed)
Occupational Therapy Treatment Patient Details Name: [name] MRN: [id number] DOB: [birth year] Today's Date: 07/01/2015    History of present illness 50 y.o. 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 Pt now s/p Biopsy of metastatic tissue and stabilization with trochanteric nail. PMH: deaf, breast cancer, anxiety, depression.    OT comments  Pt making progress with functional goals. Pt reports 6/10 L hip pain, however was able use BSC and to ambulate with RW from bed to bathroom to doorway and back to bed, min - min guard A for support/balance. OT will continue to follow acutely  Follow Up Recommendations  Home health OT;Supervision/Assistance - 24 hour    Equipment Recommendations  Tub/shower bench    Recommendations for Other Services      Precautions / Restrictions Precautions Precautions: Fall Restrictions Weight Bearing Restrictions: Yes LLE Weight Bearing: Weight bearing as tolerated       Mobility Bed Mobility Overal bed mobility: Needs Assistance Bed Mobility: Supine to Sit;Sit to Supine     Supine to sit: Supervision Sit to supine: Supervision   General bed mobility comments: pt able to advance L LE to EOB durion sup - sit, min A to get L LE back onto bed during sit - supine transition. Pt declined sitting up in recliner  Transfers Overall transfer level: Needs assistance Equipment used: Rolling walker (2 wheeled) Transfers: Sit to/from Stand Sit to Stand: Min guard Stand pivot transfers: Min guard       General transfer comment: VC's for hand placement     Balance   Sitting-balance support: No upper extremity supported;Feet supported Sitting balance-Leahy Scale: Good     Standing balance support: Single extremity supported;Bilateral upper extremity supported;During functional activity Standing balance-Leahy Scale: Fair                     ADL        Grooming: Wash/dry hands;Wash/dry face;Standing;Min guard           Upper Body Dressing : Set up;Sitting       Toilet Transfer: Min guard;BSC;Cueing for safety   Toileting- Clothing Manipulation and Hygiene: Set up;Minimal assistance;Sit to/from stand       Functional mobility during ADLs: Min guard;Minimal assistance General ADL Comments: Functional mobility with RW from bed to bathroom to doorway and back to bed, min - min guard A for support/balance      Vision  no change from baseline                              Cognition   Behavior During Therapy: [facility] for tasks assessed/performed Overall Cognitive Status: Within Functional Limits for tasks assessed                       Extremity/Trunk Assessment   WFL                        General Comments  pt very pleasant and cooperative    Pertinent Vitals/ Pain       Pain Assessment: Faces Faces Pain Scale: Hurts even more Pain Location: L hip Pain Descriptors / Indicators: Operative site guarding;Tightness Pain Intervention(s): Limited activity within patient's tolerance;Monitored during session;Premedicated before session;Repositioned  Home Living  lives at home with husband  Prior Functioning/Environment   independent           Frequency Min 2X/week     Progress Toward Goals  OT Goals(current goals can now be found in the care plan section)  Progress towards OT goals: Progressing toward goals     Plan Discharge plan remains appropriate                     End of Session Equipment Utilized During Treatment: Gait belt;Rolling walker;Other (comment) (BSC)   Activity Tolerance No increased pain   Patient Left with call bell/phone within reach;in bed;with nursing/sitter in room   Nurse Communication Mobility status        Time: [id number] OT Time Calculation (min): 16 min  Charges: OT  Treatments $Self Care/Home Management : 8-22 mins  Britt Bottom 07/01/2015, 1:16 PM

## 2015-07-01 NOTE — Progress Notes (Signed)
Physical Therapy Treatment Patient Details Name: DESHUN LORMAN MRN: [id number] DOB: [birth year] Today's Date: 07/01/2015    History of Present Illness 50 y.o. 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 Pt now s/p Biopsy of metastatic tissue and stabilization with trochanteric nail. PMH: deaf, breast cancer, anxiety, depression.     PT Comments    Pt performed transfer from bed to [facility] to recliner (x2).  PTA rolled recliner to stair well for stair training.  Required increased time and ASL interpretation via facetime.    Follow Up Recommendations  Home health PT;Supervision for mobility/OOB     Equipment Recommendations  Rolling walker with 5" wheels    Recommendations for Other Services       Precautions / Restrictions Precautions Precautions: Fall Restrictions Weight Bearing Restrictions: Yes LLE Weight Bearing: Weight bearing as tolerated    Mobility  Bed Mobility Overal bed mobility: Modified Independent Bed Mobility: Supine to Sit;Sit to Supine     Supine to sit: Modified independent (Device/Increase time) Sit to supine: Modified independent (Device/Increase time)   General bed mobility comments: Good technique.    Transfers Overall transfer level: Needs assistance Equipment used: Rolling walker (2 wheeled) Transfers: Sit to/from Stand Sit to Stand: Min guard Stand pivot transfers: Min guard       General transfer comment: VC's for hand placement   Ambulation/Gait                 Stairs Stairs: Yes Stairs assistance: Min guard Stair Management: One rail Right Number of Stairs: 6 General stair comments: Cues for sequencing pre trial via face time with asl interpreter.  Wheelchair Mobility    Modified Rankin (Stroke Patients Only)       Balance Overall balance assessment: Needs assistance   Sitting balance-Leahy Scale: Good       Standing  balance-Leahy Scale: Fair                      Cognition Arousal/Alertness: Awake/alert Behavior During Therapy: WFL for tasks assessed/performed Overall Cognitive Status: Within Functional Limits for tasks assessed                      Exercises      General Comments        Pertinent Vitals/Pain Pain Assessment: Faces Faces Pain Scale: Hurts little more Pain Location: L hip Pain Descriptors / Indicators: Guarding;Grimacing;Operative site guarding Pain Intervention(s): Monitored during session;Repositioned    Home Living                      Prior Function            PT Goals (current goals can now be found in the care plan section) Acute Rehab PT Goals Patient Stated Goal: be safe and mobile Potential to Achieve Goals: Good Progress towards PT goals: Progressing toward goals    Frequency  Min 5X/week    PT Plan Current plan remains appropriate    Co-evaluation             End of Session Equipment Utilized During Treatment: Gait belt Activity Tolerance: Patient tolerated treatment well Patient left: in chair;with call bell/phone within reach;Other (comment)     Time: [id number] PT Time Calculation (min) (ACUTE ONLY): 23 min  Charges:  $Gait Training: 8-22 mins $Therapeutic Activity: 8-22 mins  G Codes:      Cristela Blue 07/01/2015, 5:43 PM  Governor Rooks, PTA pager [phone]

## 2015-07-01 NOTE — Progress Notes (Signed)
Pharmacy Consult Note: Monitoring Post-BMA administration  25 yof w/ widespread bony metastatic dz of pelvis/femur w/ hx BC. Noted 2 bone-modifying agents given within 2 -day span. Pamidronate 90 mg given 06-28-15 and zometa 4 mg given 06-29-15.  Onc MD requested Pharmacy consult, including recommendations for following calcium, which will need to be done in hospital until stablized,  and replacement if needed ([place], Onc).  Renal function stable wnl, k 3.4 - replacing PO Corrected Ca ~8.8, Mag 2 (wnl) on 6/19  Hg trend down to 7.8, plt stable wnl  Plan: -Q6h BMET suggested by Onc for now, reduce checks as appropriate -Mag and Phos ordered for the AM -Monitor CBC, renal function, lytes closely and replete as necessary (hypomagnesemia must be corrected 1st to correct hypocalcemia)  Calcium Replacement in Adults (Recommendations if necessary):  Mild, asymptomatic hypocalcemia (corrected calcium 8-8.4 mg/dL) -Oral replacement: 1000-3000 mg of elemental calcium divided into doses of < 1 gm each  Moderate hypocalcemia (corrected calcium 7.5-7.9 mg/dL) -Consider oral replacement as above ifasymptomatic. Otherwise use calcium gluconate 1-2 gm IV in 155ml NS over 30 min  Severe hypocalcemia (corrected calcium < 7.5 mg/dL) -Calcium gluconate 2 gm IV in 168ml NS over 30 min. If pt continues to have severe hypocalcemia past IV calcium gluconate boluses, a slow calcium infusion may be needed to replace calcium. Slow calcium infusion at 0.5 to 1.5mg /kg/h (elemental calcium) should be used until calcium normalizes, then may decrease to 0.3-0.5 mg/kg/h until patient able to meet calcium needs via oral route. (Suggest using Calcium gluconate 10 gm/1 L NS which equals 930mg /L of elemental calcium). Monitor calcium every 6-12 hours while patient is on continuous infusion.   Elicia Lamp, PharmD, BCPS Clinical Pharmacist Pager [phone] 07/01/2015 3:08 PM

## 2015-07-01 NOTE — Progress Notes (Signed)
Pt refused scheduled pain medicine, Vicodin.  Pt states her pain is fine.  Pain medicine not given.  Will continue to monitor.

## 2015-07-01 NOTE — Progress Notes (Signed)
Family Medicine Teaching Service Daily Progress Note Intern Pager: [phone]  Patient name: [name] Medical record number: [id number] Date of birth: 07-Feb-1965 Age: 50 y.o. Gender: female  Primary Care Provider: Vance Gather, MD Consultants: Orthopedic Surgery, Medical Oncology, Radiation Oncology Code Status: Full  Pt Overview and Major Events to Date:  6/13: Admitted to [facility] after MRI of L hip showing widespread metastatic disease. 6/14: Underwent stabilization surgery of left proximal femur   Assessment and Plan: [name] is a 50 y.o. female presenting with widespread metastatic disease. PMH is significant for hx breast cancer s/p R sided mastectomy/chemo/radiation in 1999, congenital deafness, depression, anxiety.   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 Chest CT (6/16) showed extensive right internal mammary chain lymphadenopathy consistent with metastatic breast cancer.  - Oncology following. Will get bone scan while hospitalized. Follow-up any new recs today. - Radiation oncology consulted. Anticipate starting radiation therapy ~2 weeks post op as either an inpatient or outpatient. - Orthopedic surgery consulted. S/p stabilization of L femur and biopsy of metastatic tissue. Up with therapy and WBAT.  - PT recommended HH PT and rolling walker - Follow-up surgical path specimens --> metastatic adenocarcinoma (immunohistochemistry needed to confirm origin) - Attempted to start PCA yesterday, but Pt refused. Refused pain meds overnight. Currently on Dilaudid 0.5-1.0mg  IV q3hrs prn, OxyIR 5-10mg  q6hrs prn. Received 1 dose of Dilaudid in the last 24 hours. - Will start scheduled Norco today with PRN Dilaudid IV. Will plan to transition to PO pain med prn tomorrow. - Vitals per unit routine  Hypocalcemia: Per Oncologist, Pt received 2 doses of Zoledronate  by mistake. Ca 7.8 this morning. - Will need to monitor calcium levels closely - Awaiting pharmacy recs for monitoring - Daily BMETs  Hypokalemia: K 3.4 this morning. - K-dur 53mEq bid - Daily BMETs.  Thyroid Nodule: Indeterminate right thyroid nodule seen on chest CT. TSH was normal. - Consider thyroid US - Can likely follow-up with this as oupatient, as she has a lot of other things going on.   Normocytic Anemia: Hgb 10.7 on admission > 10.5 > 9.5 > 8.3 > 8.3 > 7.8 > 7.8. (Hgb was 11.8 two weeks ago). May be secondary to anemia of chronic disease or Pt may have some bone marrow involvement and now with drop post-op. Retic 2.4%. - Daily CBCs - Transfusion threshold of 7 or if symptomatic.  Poor PO: Patient says she is having trouble with nausea. - Zofran and Reglan for nausea - Ensure  Constipation resolved: Had 3 BMs overnight - Senokot-S 2 tablets bid - Pt initially asked for suppository, but then refused.  GERD: Takes TUMS at home.  - Pepcid 10mg  bid - GI cocktail prn  Hx Depression and Anxiety: Not on any medications at home. Pt states she is overwhelmed by everything that has happened today. - Ativan 1mg  q4hrs prn for anxiety.  FEN/GI: Regular diet, SLIV Prophylaxis: Lovenox 30 mg BID   Disposition: Pending pain control. Plan for discharge home with HHPT and rolling walker.  Subjective:  Pt states she is frustrated this morning. She feels like it takes a long time for the nurses to come in her room. She feels like the leg pain is well-controlled while she is sitting, but is very painful while she is walking. No other concerns this morning.  Objective: Temp:  [97.7 F (36.5 C)-98.3 F (36.8 C)] 98.3 F (36.8 C) (  06/19 0549) Pulse Rate:  [69-79] 79 (06/19 0549) Resp:  [18] 18 (06/19 0549) BP: (119-120)/(59-67) 120/59 mmHg (06/19 0549) SpO2:  [98 %-100 %] 100 % (06/19 0549) Physical Exam: General: Tired-appearing, communicating with sign language (interpreter  present), eating breakfast. Cardiovascular: RRR, no murmurs Respiratory: CTAB, normal work of breathing Abdomen: +BS, soft, non-distended, non-tender MSK: No edema or cyanosis Skin: Wound dressing in place over left lateral thigh. Transverse scar across abdomen, no rashes Neuro: Awake, alert, oriented, CN 2-12 grossly intact, no focal deficits Psych: Flattened affect, appropriate behavior  Laboratory:  Recent Labs Lab 06/29/15 0330 06/30/15 0920 07/01/15 0304  WBC 6.3 8.4 6.2  HGB 8.3* 7.8* 7.8*  HCT 25.2* 23.4* 24.2*  PLT 221 228 244    Recent Labs Lab 06/25/15 1412 06/26/15 0340  06/29/15 0330 06/30/15 0920 07/01/15 0304  NA 139 137  < > 134* 140 138  K 3.5 4.0  < > 3.6 3.4* 3.4*  CL 104 101  < > 100* 108 106  CO2 27 29  < > 27 24 23   BUN 8 9  < > 6 7 <5*  CREATININE 0.87 0.94  < > 0.74 0.76 0.61  CALCIUM 9.3 9.3  < > 8.4* 8.1* 7.8*  PROT 6.5 6.7  --   --   --   --   BILITOT 1.1 1.1  --   --   --   --   ALKPHOS 74 73  --   --   --   --   ALT 11* 11*  --   --   --   --   AST 16 13*  --   --   --   --   GLUCOSE 84 97  < > 105* 145* 85  < > = values in this interval not displayed.  Imaging/Diagnostic Tests: - MRI L hip (6/13): Widespread bony metastatic disease with numerous foci of tumor throughout the bony pelvis and femurs. Confluent tumor in the left proximal femoral intertrochanteric region in shaft, where the medullary space is completely replaced by tumor over a 15 cm excursion, and where there some anterior bony cortical destruction, periosteal reaction, and possibly early periosteal extension of tumor along the proximal tibial metaphysis and shaft especially anteriorly. Low-level edema in the adjacent vastus intermedius and iliopsoas musculature in this area. In general the various tumor deposits are occult and/or highly inconspicuous on CT ; given the extensive tumor in the bony pelvis and hips, I would expect similar widespread osseous metastatic disease  elsewhere in the axial and appendicular skeleton. Further workup with whole-body bone scan, nuclear medicine PET-CT, and/or MRI workup may be warranted. - X-ray cervical spine: Possible metastatic lesions in the left transverse process at T1 and the posterior 4th rib. - CXR: possible expansile lesion in the left posterior 4th rib - X-ray left humerus: No focal bone lesions - X-ray right humerus: No focal bone lesions - X-ray right tib/fib: No focal bone lesions - X-ray left tib/fib: No focal bone lesions  [name], MD 07/01/2015, 7:14 AM PGY-1, [place] Intern pager: [phone], text pages welcome

## 2015-07-02 LAB — CBC
HCT: 27.9 % — ABNORMAL LOW (ref 36.0–46.0)
Hemoglobin: 9.1 g/dL — ABNORMAL LOW (ref 12.0–15.0)
MCH: 28.5 pg (ref 26.0–34.0)
MCHC: 32.6 g/dL (ref 30.0–36.0)
MCV: 87.5 fL (ref 78.0–100.0)
Platelets: 288 10*3/uL (ref 150–400)
RBC: 3.19 MIL/uL — ABNORMAL LOW (ref 3.87–5.11)
RDW: 12.9 % (ref 11.5–15.5)
WBC: 6.9 10*3/uL (ref 4.0–10.5)

## 2015-07-02 LAB — BASIC METABOLIC PANEL
Anion gap: 6 (ref 5–15)
CALCIUM: 8.3 mg/dL — AB (ref 8.9–10.3)
CO2: 29 mmol/L (ref 22–32)
CREATININE: 0.69 mg/dL (ref 0.44–1.00)
Chloride: 103 mmol/L (ref 101–111)
GFR calc Af Amer: >60 mL/min (ref >60)
Glucose, Bld: 95 mg/dL (ref 65–99)
POTASSIUM: 3.6 mmol/L (ref 3.5–5.1)
SODIUM: 138 mmol/L (ref 135–145)

## 2015-07-02 LAB — PHOSPHORUS: PHOSPHORUS: 3.4 mg/dL (ref 2.5–4.6)

## 2015-07-02 LAB — MAGNESIUM: MAGNESIUM: 2.1 mg/dL (ref 1.7–2.4)

## 2015-07-02 MED ORDER — ASPIRIN 325 MG PO TBEC: 325.0000 mg | DELAYED_RELEASE_TABLET | Freq: Every day | ORAL | Status: DC

## 2015-07-02 MED ORDER — HYDROCODONE-ACETAMINOPHEN 5-325 MG PO TABS: 1.0000 | ORAL_TABLET | Freq: Four times a day (QID) | ORAL | Status: DC | PRN

## 2015-07-02 MED ORDER — SENNOSIDES-DOCUSATE SODIUM 8.6-50 MG PO TABS: 1.0000 | ORAL_TABLET | Freq: Two times a day (BID) | ORAL | Status: DC

## 2015-07-02 MED ORDER — ONDANSETRON HCL 4 MG PO TABS: 4.0000 mg | ORAL_TABLET | Freq: Four times a day (QID) | ORAL | Status: DC | PRN

## 2015-07-02 NOTE — Progress Notes (Signed)
Patient refused all night time medicine including potassium but requested for IV dilaudid for pain.

## 2015-07-02 NOTE — Progress Notes (Signed)
Discussed discharge summary with patient translated by interpreter [facility]. Reviewed all medications. Patient received Rx. Patient sent home with equipment. Patient ready for discharge.

## 2015-07-02 NOTE — Progress Notes (Signed)
Occupational Therapy Treatment Patient Details Name: [name] MRN: [id number] DOB: 09/19/1965 Today's Date: 07/02/2015    History of present illness 50 y.o. 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 Pt now s/p Biopsy of metastatic tissue and stabilization with trochanteric nail. PMH: deaf, breast cancer, anxiety, depression.    OT comments  Pr declined mobility with OT and preferred to wait for PT today. Sign language interpreter present this session. Pt educated on ADL A/E kit for home use and pt provide with kit to take home at d/c. Pt scheduled to d/c home today, OT will continue to follow acutely if not going home today  Follow Up Recommendations  Home health OT;Supervision/Assistance - 24 hour    Equipment Recommendations  Tub/shower bench;Other (comment) (ADL A/E kit)    Recommendations for Other Services      Precautions / Restrictions Precautions Precautions: Fall Restrictions Weight Bearing Restrictions: Yes LLE Weight Bearing: Weight bearing as tolerated       Mobility Bed Mobility Overal bed mobility: Modified Independent Bed Mobility: Supine to Sit;Sit to Supine     Supine to sit: Modified independent (Device/Increase time)     General bed mobility comments: Pt declined sititng EOB, but agreeable to ADL A/E instruction  Transfers Overall transfer level: Needs assistance Equipment used: Rolling walker (2 wheeled) Transfers: Sit to/from Stand Sit to Stand: Supervision Stand pivot transfers: Supervision       General transfer comment: Pt declined transefrs and mobility with OT, pt prefers to wait for PT    Balance     Sitting balance-Leahy Scale: Normal       Standing balance-Leahy Scale: Fair                     ADL                                         General ADL Comments: Pt provided with education and demo od ADL A/E for  home use. Pt able to return demo      Vision  no change from baseline                              Cognition   Behavior During Therapy: [facility] for tasks assessed/performed Overall Cognitive Status: Within Functional Limits for tasks assessed                       Extremity/Trunk Assessment   WFL                       General Comments  Pt pleasant and anxious to d/c home    Pertinent Vitals/ Pain       Pain Assessment: 0-10 Pain Score: 6  Faces Pain Scale: Hurts even more Pain Location: L LE Pain Descriptors / Indicators: Grimacing;Guarding;Discomfort Pain Intervention(s): Monitored during session;Repositioned;Ice applied  Home Living  home with husband                                        Prior Functioning/Environment  independent           Frequency Min 2X/week  Progress Toward Goals  OT Goals(current goals can now be found in the care plan section)  Progress towards OT goals: OT to reassess next treatment  Acute Rehab OT Goals Patient Stated Goal: be safe and mobile  Plan Discharge plan remains appropriate                     End of Session Equipment Utilized During Treatment: Other (comment) (ADL A/E kit)   Activity Tolerance Patient tolerated treatment well   Patient Left with call bell/phone within reach;in bed;with nursing/sitter in room;with family/visitor present;Other (comment) (PT arrived)   Nurse Communication  nurse present at end of session        Time: [phone] OT Time Calculation (min): 23 min  Charges: OT General Charges $OT Visit: 1 Procedure OT Treatments $Self Care/Home Management : 8-22 mins $Therapeutic Activity: 8-22 mins  Britt Bottom 07/02/2015, 10:58 AM

## 2015-07-02 NOTE — Progress Notes (Signed)
Patient refused BMP drawn at this time.

## 2015-07-02 NOTE — Anesthesia Postprocedure Evaluation (Signed)
Anesthesia Post Note  Patient: [name]  Procedure(s) Performed: Procedure(s) (LRB): LEFT BIOMET LONG AFFIXS NAIL (Left)  Patient location during evaluation: PACU Anesthesia Type: General Level of consciousness: awake and alert and patient cooperative Pain management: pain level controlled Vital Signs Assessment: post-procedure vital signs reviewed and stable Respiratory status: spontaneous breathing and respiratory function stable Cardiovascular status: stable Anesthetic complications: no    Last Vitals:  Filed Vitals:   07/01/15 2136 07/02/15 0631  BP: 107/56 120/67  Pulse: 83 74  Temp: 37 C 36.8 C  Resp: 18 17    Last Pain:  Filed Vitals:   07/02/15 0633  PainSc: [place]

## 2015-07-02 NOTE — Discharge Instructions (Signed)
You were hospitalized with hip pain and we found that you had metastatic cancer in the bones of your pelvis and thighs. We think the cancer is likely coming from the breast. When you leave the hospital, Dr. Marko Plume will take over coordinating all of your cancer care.  We have ordered home health physical therapy and a walker to help you be able to get around your home.   To help prevent blood clots, we have started you on Aspirin 325mg . Please take this daily.  We have also sent you home with a pain medication called Norco. You can take 1 tablet every 6 hours as needed for pain. We have also sent you home with Zofran every 4 hours as needed for nausea and Senokot twice a day as needed to help you have regular bowel movements.

## 2015-07-02 NOTE — Discharge Summary (Signed)
[name] Discharge Summary  Patient name: [name] Medical record number: [id number] Date of birth: [birth year] Age: 50 y.o. 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 Incision is clean and dry. Transverse scar across abdomen, no rashes Neuro: Awake, alert, oriented, CN 2-12 grossly intact, no focal deficits Psych: Appropriate affect, normal behavior  Brief [name] Course:  [name] is a 50 year old female with a PMH of congenital deafness, hx breast cancer in 1999, depression/anxiety who presented to the [name] for MRI under sedation to evaluate persistent L hip pain. MRI showed widespread bony metastatic disease with numerous foci of tumor throughout the bony pelvis and femurs. Pt's oncologist, Dr. Marko Plume, was consulted and recommended admission and orthopedic surgery and radiation oncology consults. Pt underwent stabilization of left proximal femur on 6/14 to prevent imminent pathological fracture. Surgical path specimen was obtained during the surgery and eventually resulted as metastatic adenocarcinoma. Immunohistochemical diagnosis was pending at  the time of discharge. Oncology ordered a series of x-rays, which showed possible mets at T1 and the posterior left 4th rib. They also got a bone scan, which showed increased activity of the left femur, changes likely secondary to prior surgery, but no other focal abnormalities to suggest metastatic disease. CT chest was also performed and showed extensive right internal mammary chain lymphadenopathy, consistent with metastatic breast cancer as well as lytic metastases involving the posterior elements at T1 with probabe nondisplaced pathologic fracture of the spinous process. Radiation oncology was consulted and recommended starting radiation therapy ~2 weeks post-op. PT/OT were consulted post-op and worked on strength and mobility. PT recommended HHPT and a rolling walker. OT recommended HHOT and tub/shower bench. Pt was discharged with Norco prn for pain.  Pt was given Pamidronate on 6/16 and Zometa 6/17 by mistake. She should have only received one dose of a bisphosphonate. Pharmacy was consulted and recommended q6hrs BMETs to monitor calcium levels. Pt's calcium levels initially declined but slowly trended up to normal range. Pt did not require any calcium supplementation.   Pt was noted to have an incidental thyroid nodule on CT chest. TSH was performed and was normal. Consider further work-up as an outpatient. Inpatient work-up was not performed, as Pt had other more pressing medical problems.  Pt was noted to have a normocytic anemia during her hospitalization. Hgb was 10.7 on admission and decreased to 7.8 after her surgery, and then trended back up to 9.1 on the day of discharge. Ferritin was 548. Reticulocyte count was 2.4%. Per oncology, she may have some bone marrow involvement with her metastatic disease.  Issues for Follow Up:  1. Recommend repeat BMET at [name] follow-up appointment to monitor calcium level. 2. Thyroid nodule was found incidentally on chest CT. TSH was normal. Can consider  additional work-up as an outpatient.  Significant Procedures: L femur stabilization surgery 6/14.  Significant Labs and  Imaging:   Recent Labs Lab 06/30/15 0920 07/01/15 0304 07/02/15 0354  WBC 8.4 6.2 6.9  HGB 7.8* 7.8* 9.1*  HCT 23.4* 24.2* 27.9*  PLT 228 244 288    Recent Labs Lab 06/25/15 1412 06/26/15 0340  06/30/15 0920 07/01/15 0304 07/01/15 1108 07/01/15 1538 07/02/15 0354  NA 139 137  < > 140 138 139 138 138  K 3.5 4.0  < > 3.4* 3.4* 3.4* 3.1* 3.6  CL 104 101  < > 108 106 107 104 103  CO2 27 29  < > 24 23 26 26 29   GLUCOSE 84 97  < > 145* 85 99 103* 95  BUN 8 9  < > 7 <5* <5* <5* <5*  CREATININE 0.87 0.94  < > 0.76 0.61 0.64 0.65 0.69  CALCIUM 9.3 9.3  < > 8.1* 7.8* 8.0* 8.2* 8.3*  MG  --   --   --   --   --  2.0  --  2.1  PHOS  --   --   --   --   --   --   --  3.4  ALKPHOS 74 73  --   --   --  66  --   --   AST 16 13*  --   --   --  16  --   --   ALT 11* 11*  --   --   --  18  --   --   ALBUMIN 3.2* 3.2*  --   --   --  2.7*  --   --   < > = values in this interval not displayed.  - MRI L hip (6/13): Widespread bony metastatic disease with numerous foci of tumor throughout the bony pelvis and femurs. Confluent tumor in the left proximal femoral intertrochanteric region in shaft, where the medullary space is completely replaced by tumor over a 15 cm excursion, and where there some anterior bony cortical destruction, periosteal reaction, and possibly early periosteal extension of tumor along the proximal tibial metaphysis and shaft especially anteriorly. Low-level edema in the adjacent vastus intermedius and iliopsoas musculature in this area. In general the various tumor deposits are occult and/or highly inconspicuous on CT ; given the extensive tumor in the bony pelvis and hips, I would expect similar widespread osseous metastatic disease elsewhere in the axial and appendicular skeleton. Further workup with whole-body bone scan, nuclear medicine PET-CT, and/or MRI  workup may be warranted. - X-ray cervical spine: Possible metastatic lesions in the left transverse process at T1 and the posterior 4th rib. - CXR: possible expansile lesion in the left posterior 4th rib - X-ray left humerus: No focal bone lesions - X-ray right humerus: No focal bone lesions - X-ray right tib/fib: No focal bone lesions - X-ray left tib/fib: No focal bone lesions - CT chest: extensive right internal mammary chain lymphadenopathy, consistent with metastatic breast cancer; lytic metastases involving the posterior elements at T1 with probable nondisplaced pathologic fracture of the spinous process, indeterminate right thyroid nodule. - Bone scan: increased activity noted throughout the left femur. Although metastatic disease cannot be excluded, these changes are most likely secondary to prior surgery. No other focal abnormalities to suggest metastatic disease.   Results/Tests Pending at Time of Discharge: Immunohistochemical testing of surg path specimen.  Discharge Medications:    Medication List    STOP taking these medications        docusate sodium 100 MG capsule  Commonly known as:  COLACE     traMADol 50 MG tablet  Commonly known as:  ULTRAM      TAKE these medications        ADVIL 200 MG Caps  Generic drug:  Ibuprofen  Take 2 capsules by mouth as needed.     aspirin 325 MG EC tablet  Take 1 tablet (325 mg total) by mouth daily with breakfast.     HYDROcodone-acetaminophen 5-325 MG tablet  Commonly known as:  NORCO/VICODIN  Take 1 tablet by mouth every 6 (six) hours as needed for moderate pain.     ondansetron 4 MG tablet  Commonly known as:  ZOFRAN  Take 1 tablet (4 mg total) by mouth every 6 (six) hours as needed for nausea.     senna-docusate 8.6-50 MG tablet  Commonly known as:  Senokot-S  Take 1 tablet by mouth 2 (two) times daily.        Discharge Instructions: Please refer to Patient Instructions section of EMR for full details.  Patient was  counseled important signs and symptoms that should prompt return to medical care, changes in medications, dietary instructions, activity restrictions, and follow up appointments.   Follow-Up Appointments: Follow-up Information    Follow up with [name], MD On 07/03/2015.   Specialty:  Orthopedic Surgery   Why:  Post-op appointment at 10:15am   Contact information:   [place] [address] [phone]       Follow up with Vance Gather, MD On 07/09/2015.   Specialty:  Family Medicine   Why:  [name] follow-up appointment at 10:00am   Contact information:   [place] [address] [phone]       Follow up with Gordy Levan, MD On 07/12/2015.   Specialty:  Oncology   Why:  oncology appointment at 12:00pm.   Contact information:   [place] [address] [place], MD 07/02/2015, 12:39 PM PGY-1, [place]

## 2015-07-02 NOTE — Progress Notes (Signed)
Physical Therapy Treatment Patient Details Name: [name] MRN: [id number] DOB: 08/13/1965 Today's Date: 07/02/2015    History of Present Illness 50 y.o. 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 Pt now s/p Biopsy of metastatic tissue and stabilization with trochanteric nail. PMH: deaf, breast cancer, anxiety, depression.     PT Comments    Pt performed stair training and complete review of HEP at d/c.  Pt refused gait training due to nausea and dizziness.  Will continue POC during acute hospitalization.    Follow Up Recommendations  Home health PT;Supervision for mobility/OOB     Equipment Recommendations  Rolling walker with 5" wheels    Recommendations for Other Services       Precautions / Restrictions Precautions Precautions: Fall Restrictions Weight Bearing Restrictions: Yes LLE Weight Bearing: Weight bearing as tolerated    Mobility  Bed Mobility Overal bed mobility: Modified Independent Bed Mobility: Supine to Sit;Sit to Supine     Supine to sit: Modified independent (Device/Increase time)     General bed mobility comments: Good technique.    Transfers Overall transfer level: Needs assistance Equipment used: Rolling walker (2 wheeled) Transfers: Sit to/from Stand Sit to Stand: Supervision Stand pivot transfers: Supervision       General transfer comment: VC's for hand placement, pt remains to pull on RW.    Ambulation/Gait Ambulation/Gait assistance:  (deferred gait after stair and HEP due to dizziness from nausea meds.  )               Stairs Stairs: Yes Stairs assistance: Supervision Stair Management: One rail Right;Backwards;Forwards;Step to pattern Number of Stairs: 5 General stair comments: Cues for sequencing and use of R hand rail.  pt performed forward to ascend and backwards to descend.  Pt required cues for RW placement by caregiver at top  of stairs so it will be waiting on her.    Wheelchair Mobility    Modified Rankin (Stroke Patients Only)       Balance     Sitting balance-Leahy Scale: Normal       Standing balance-Leahy Scale: Fair                      Cognition Arousal/Alertness: Awake/alert Behavior During Therapy: WFL for tasks assessed/performed Overall Cognitive Status: Within Functional Limits for tasks assessed                      Exercises Total Joint Exercises Ankle Circles/Pumps: AROM;10 reps;Supine;Both Quad Sets: Left;10 reps;Supine;AROM Short Arc Quad: AROM;Left;10 reps;Supine Heel Slides: AAROM;Left;10 reps;Supine Hip ABduction/ADduction: AAROM;Left;10 reps;Supine;AROM;Standing (1x10 supine AAROM, 1x10 standing AROM.) Long Arc Quad: AROM;Left;10 reps;Seated Knee Flexion: AROM;Left;10 reps;Standing Marching in Standing: AROM;Left;10 reps;Standing Standing Hip Extension: AROM;Left;10 reps;Standing    General Comments        Pertinent Vitals/Pain Pain Assessment: Faces Faces Pain Scale: Hurts even more Pain Location: L hip with movement Pain Descriptors / Indicators: Grimacing;Guarding;Discomfort Pain Intervention(s): Monitored during session;Repositioned;Ice applied    Home Living                      Prior Function            PT Goals (current goals can now be found in the care plan section) Acute Rehab PT Goals Patient Stated Goal: be safe and mobile Potential to Achieve Goals: Good Progress towards PT goals: Progressing toward goals  Frequency  Min 5X/week    PT Plan Current plan remains appropriate    Co-evaluation             End of Session Equipment Utilized During Treatment: Gait belt Activity Tolerance: Patient tolerated treatment well Patient left: in chair;with call bell/phone within reach;Other (comment)     Time: [id number] PT Time Calculation (min) (ACUTE ONLY): 37 min  Charges:  $Gait Training: 8-22  mins $Therapeutic Exercise: 8-22 mins                    G Codes:      Cristela Blue 07-11-2015, 10:22 AM  Governor Rooks, PTA pager [phone]

## 2015-07-02 NOTE — Progress Notes (Signed)
Family Medicine Teaching Service Daily Progress Note Intern Pager: [phone]  Patient name: [name] Medical record number: [id number] Date of birth: [birth year] Age: 50 y.o. Gender: female  Primary Care Provider: Vance Gather, MD Consultants: Orthopedic Surgery, Medical Oncology, Radiation Oncology Code Status: Full  Pt Overview and Major Events to Date:  6/13: Admitted to [facility] after MRI of L hip showing widespread metastatic disease. 6/14: Underwent stabilization surgery of left proximal femur   Assessment and Plan: [name] is a 50 y.o. female presenting with widespread metastatic disease. PMH is significant for hx breast cancer s/p R sided mastectomy/chemo/radiation in 1999, congenital deafness, depression, anxiety.   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 Chest CT (6/16) showed extensive right internal mammary chain lymphadenopathy consistent with metastatic breast cancer. Bone scan (6/19): Increased activity in the left femur, changes likely secondary to prior surgery, no other focal abnormalities to suggest metastatic disease. - Oncology following. Will follow-up with her in clinic.  - Radiation oncology consulted. Anticipate starting radiation therapy ~2 weeks post op as either an inpatient or outpatient. - Orthopedic surgery consulted. S/p stabilization of L femur and biopsy of metastatic tissue. Up with therapy and WBAT. Follow-up in 1-2 weeks in clinic. - Follow-up surgical path specimens --> metastatic adenocarcinoma (immunohistochemistry needed to confirm origin) - Scheduled Norco 5-325mg  q6hrs, Dilaudid 0.5mg  IV q3hrs prn. Received 1 dose of prn Dilaudid in the last 24 hours. Will d/c Dilaudid today. Pt states she no longer needs IV pain medications.  - Vitals per unit routine - PT recommended HH PT and rolling walker  Hypocalcemia: Per  Oncologist, Pt received 1 dose of Pamidronate 6/16 and 1 dose of Zometa 6/17 by mistake. Ca 7.8 > 8.0 > 8.2 > 8.3. Has not required any calcium supplementation. - Will d/c q6hr BMETs - Spoke with pharmacy, do not recommend any additional monitoring.  Hypokalemia: K 3.4 > 3.1 > 3.6. Mag 2.1. - K-dur 72mEq bid - Daily BMETs.  Thyroid Nodule: Indeterminate right thyroid nodule seen on chest CT. TSH was normal. - Can likely follow-up with this as oupatient, as she has a lot of other things going on.   Normocytic Anemia: Hgb 10.7 on admission > 10.5 > 7.8 > 9.1. (Hgb was 11.8 two weeks ago). May be secondary to anemia of chronic disease or Pt may have some bone marrow involvement and now with drop post-op. Retic 2.4%. Ferritin 548. - Daily CBCs - Transfusion threshold of 7 or if symptomatic.  Poor PO: Patient says she is having trouble with nausea. - Zofran and Reglan for nausea - Ensure  Constipation resolved: Had 2 BMs yesterday. - Cannot tolerate Miralax - Senokot-S 2 tablets bid  GERD: Takes TUMS at home.  - Pepcid 10mg  bid - GI cocktail prn  Hx Depression and Anxiety: Not on any medications at home. Pt states she is overwhelmed by everything that has happened today. - Ativan 1mg  q4hrs prn for anxiety.  FEN/GI: Regular diet, take out IV today. Prophylaxis: Lovenox 30 mg BID   Disposition: Plan for discharge home today with HHPT and rolling walker.  Subjective:  Pt states she is fine this morning. She is ready to go home. She is tired of being in the hospital. She would like her IV out because it is bothering her. She states she is no longer needing IV pain medication.   Objective: Temp:  [98.2 F (36.8 C)-98.9 F (  37.2 C)] 98.2 F (36.8 C) (06/20 0631) Pulse Rate:  [74-83] 74 (06/20 0631) Resp:  [17-18] 17 (06/20 0631) BP: (107-124)/(56-74) 120/67 mmHg (06/20 0631) SpO2:  [97 %-100 %] 97 % (06/20 0631) Physical Exam: General: Tired-appearing, communicating with sign  language (interpreter present), pleasant Cardiovascular: RRR, no murmurs Respiratory: CTAB, normal work of breathing Abdomen: +BS, soft, non-distended, non-tender MSK: No edema or cyanosis Skin: Wound dressing in place over left lateral thigh. Incision is clean and dry. Transverse scar across abdomen, no rashes Neuro: Awake, alert, oriented, CN 2-12 grossly intact, no focal deficits Psych: Appropriate affect, normal behavior  Laboratory:  Recent Labs Lab 06/30/15 0920 07/01/15 0304 07/02/15 0354  WBC 8.4 6.2 6.9  HGB 7.8* 7.8* 9.1*  HCT 23.4* 24.2* 27.9*  PLT 228 244 288    Recent Labs Lab 06/25/15 1412 06/26/15 0340  07/01/15 1108 07/01/15 1538 07/02/15 0354  NA 139 137  < > 139 138 138  K 3.5 4.0  < > 3.4* 3.1* 3.6  CL 104 101  < > 107 104 103  CO2 27 29  < > 26 26 29   BUN 8 9  < > <5* <5* <5*  CREATININE 0.87 0.94  < > 0.64 0.65 0.69  CALCIUM 9.3 9.3  < > 8.0* 8.2* 8.3*  PROT 6.5 6.7  --  5.8*  --   --   BILITOT 1.1 1.1  --  1.0  --   --   ALKPHOS 74 73  --  66  --   --   ALT 11* 11*  --  18  --   --   AST 16 13*  --  16  --   --   GLUCOSE 84 97  < > 99 103* 95  < > = values in this interval not displayed.  Imaging/Diagnostic Tests: - MRI L hip (6/13): Widespread bony metastatic disease with numerous foci of tumor throughout the bony pelvis and femurs. Confluent tumor in the left proximal femoral intertrochanteric region in shaft, where the medullary space is completely replaced by tumor over a 15 cm excursion, and where there some anterior bony cortical destruction, periosteal reaction, and possibly early periosteal extension of tumor along the proximal tibial metaphysis and shaft especially anteriorly. Low-level edema in the adjacent vastus intermedius and iliopsoas musculature in this area. In general the various tumor deposits are occult and/or highly inconspicuous on CT ; given the extensive tumor in the bony pelvis and hips, I would expect similar widespread  osseous metastatic disease elsewhere in the axial and appendicular skeleton. Further workup with whole-body bone scan, nuclear medicine PET-CT, and/or MRI workup may be warranted. - X-ray cervical spine: Possible metastatic lesions in the left transverse process at T1 and the posterior 4th rib. - CXR: possible expansile lesion in the left posterior 4th rib - X-ray left humerus: No focal bone lesions - X-ray right humerus: No focal bone lesions - X-ray right tib/fib: No focal bone lesions - X-ray left tib/fib: No focal bone lesions  [name], MD 07/02/2015, 7:32 AM PGY-1, [place] Intern pager: [phone], text pages welcome

## 2015-07-07 ENCOUNTER — Other Ambulatory Visit: Payer: Self-pay | Admitting: Oncology

## 2015-07-07 DIAGNOSIS — C799 Secondary malignant neoplasm of unspecified site: Principal | ICD-10-CM

## 2015-07-07 DIAGNOSIS — C50919 Malignant neoplasm of unspecified site of unspecified female breast: Secondary | ICD-10-CM

## 2015-07-08 ENCOUNTER — Ambulatory Visit
Admission: RE | Admit: 2015-07-08 | Discharge: 2015-07-08 | Disposition: A | Discharge status: Home / Self Care | Payer: Medicaid Other | Source: Ambulatory Visit | Attending: Radiation Oncology | Admitting: Radiation Oncology

## 2015-07-08 ENCOUNTER — Encounter: Payer: Self-pay | Admitting: Radiation Oncology

## 2015-07-08 ENCOUNTER — Other Ambulatory Visit (HOSPITAL_BASED_OUTPATIENT_CLINIC_OR_DEPARTMENT_OTHER): Payer: Medicaid Other

## 2015-07-08 ENCOUNTER — Telehealth: Payer: Self-pay | Admitting: Oncology

## 2015-07-08 ENCOUNTER — Telehealth: Payer: Self-pay | Admitting: *Deleted

## 2015-07-08 VITALS — BP 107/60 | HR 94 | Temp 98.0°F | Ht 67.0 in | Wt 132.1 lb

## 2015-07-08 DIAGNOSIS — C50919 Malignant neoplasm of unspecified site of unspecified female breast: Secondary | ICD-10-CM | POA: Diagnosis present

## 2015-07-08 DIAGNOSIS — Z510 Encounter for antineoplastic radiation therapy: Secondary | ICD-10-CM | POA: Insufficient documentation

## 2015-07-08 DIAGNOSIS — C419 Malignant neoplasm of bone and articular cartilage, unspecified: Secondary | ICD-10-CM

## 2015-07-08 DIAGNOSIS — Z9011 Acquired absence of right breast and nipple: Secondary | ICD-10-CM | POA: Insufficient documentation

## 2015-07-08 DIAGNOSIS — C801 Malignant (primary) neoplasm, unspecified: Secondary | ICD-10-CM

## 2015-07-08 DIAGNOSIS — Z170 Estrogen receptor positive status [ER+]: Secondary | ICD-10-CM | POA: Insufficient documentation

## 2015-07-08 DIAGNOSIS — C7951 Secondary malignant neoplasm of bone: Secondary | ICD-10-CM | POA: Insufficient documentation

## 2015-07-08 DIAGNOSIS — C799 Secondary malignant neoplasm of unspecified site: Principal | ICD-10-CM

## 2015-07-08 LAB — COMPREHENSIVE METABOLIC PANEL
ALBUMIN: 3.6 g/dL (ref 3.5–5.0)
ALK PHOS: 116 U/L (ref 40–150)
ALT: 21 U/L (ref 0–55)
AST: 12 U/L (ref 5–34)
Anion Gap: 10 mEq/L (ref 3–11)
BILIRUBIN TOTAL: 0.88 mg/dL (ref 0.20–1.20)
BUN: 10.5 mg/dL (ref 7.0–26.0)
CO2: 28 mEq/L (ref 22–29)
Calcium: 9.8 mg/dL (ref 8.4–10.4)
Chloride: 104 mEq/L (ref 98–109)
Creatinine: 0.9 mg/dL (ref 0.6–1.1)
EGFR: 80 mL/min/{1.73_m2} — ABNORMAL LOW (ref >90)
GLUCOSE: 105 mg/dL (ref 70–140)
Potassium: 4 mEq/L (ref 3.5–5.1)
SODIUM: 141 meq/L (ref 136–145)
TOTAL PROTEIN: 8.1 g/dL (ref 6.4–8.3)

## 2015-07-08 LAB — MAGNESIUM: Magnesium: 2.3 mg/dl (ref 1.5–2.5)

## 2015-07-08 NOTE — Telephone Encounter (Signed)
Called and left a message for patient's husband to make sure they were notified about the appointment with Dr. Sondra Come today.  Patient called back with the assistance of an interpreter.  She said she did not know about the appointment.  Apologized and asked her if she would be able to come for the appointment today and to get labs beforehand.  Patient said she could come in but may be running a little late.

## 2015-07-08 NOTE — Progress Notes (Signed)
Radiation Oncology         ([phone]) [phone] ________________________________  Initial Outpatient Consultation  Name: [name] MRN: [id number]  Date: 07/08/2015  DOB: June 27, 1965  [id number] Bonner Puna, MD  Gordy Levan, MD   REFERRING PHYSICIAN: Gordy Levan, MD  DIAGNOSIS:  recurrent breast cancer with extensive osseous metastasis   HISTORY OF PRESENT ILLNESS::[name] is a 50 y.o. female with remote history of node + right breast cancer at age 40.  Patient had 1.4 cm poorly differentiated right breast carcinoma diagnosed in Nov 1999 at age 55, 1/7 axillary nodes involved, ER/PR and HER 2 positive. She had mastectomy with  axillary node evaluation, 4 cycles of adriamycin/ cytoxan followed by taxotere, then five years of tamoxifen thru June 2005 (no herceptin in 1999). She had right TRAM reconstruction. She had bilateral salpingoophorectomy in May 2005. She was briefly on aromatase inhibitor after tamoxifen, but discontinued this herself due to poor tolerance. ( Dr. Mariana Kaufman intake note)  She recently presented with severe left hip pain with CT scans showing no obvious problem, however  MRI showing extensive what appears to be osseous metastasis of the left pelvis and left femur with impending pathologic fracture of left femur.  She underwent  Surgery 06/25/15 with Dr. [name]  with orthopedic stabilization of entire left femur with intramedullary rod with interlocks.  She did well after her surgery with improvement in her pain.  Pathology from the surgery is consistent with metastatic adenocarcinoma. Immunohistochemical stains were performed with breast and gynecologic primary as likely origins. The tumor was positive for estrogen receptors. The patient is now to be considered for postoperative radiation treatment  PREVIOUS RADIATION THERAPY: No  PAST MEDICAL HISTORY:  has a past medical history of Congenital deafness; CIN I (cervical intraepithelial neoplasia I) (2003); S/P  bilateral oophorectomy; Deaf; Cancer ([place]) (1999); Breast cancer ([place]); Abnormal Pap smear; Depression; and Anxiety.    PAST SURGICAL HISTORY: Past Surgical History  Procedure Laterality Date  . Ovarian cyst removal    . Tubal ligation    . Abdominal hysterectomy    . Mastectomy      right breast  . Mastectomy    . Radiology with anesthesia Left 06/25/2015    Procedure: MRI OF LEFT HIP WITH OR WITHOUT CONTRAST;  Surgeon: Medication Radiologist, MD;  Location: [place];  Service: Radiology;  Laterality: Left;  DR. MCINTYRE/MRI  . Intramedullary (im) nail intertrochanteric Left 06/26/2015    Procedure: LEFT BIOMET LONG AFFIXS NAIL;  Surgeon: [name], MD;  Location: [place];  Service: Orthopedics;  Laterality: Left;    FAMILY HISTORY: family history includes Alzheimer's disease in her maternal uncle; Heart attack in her maternal grandfather; Hypertension in her mother; Pancreatitis in her maternal grandmother; Seizures in her mother.  SOCIAL HISTORY:  reports that she has never smoked. She has never used smokeless tobacco. She reports that she does not drink alcohol or use illicit drugs.  ALLERGIES: Promethazine hcl and Diphenhydramine hcl  MEDICATIONS:  Current Outpatient Prescriptions  Medication Sig Dispense Refill  . aspirin EC 325 MG EC tablet Take 1 tablet (325 mg total) by mouth daily with breakfast. 30 tablet 0  . divalproex (DEPAKOTE) 250 MG DR tablet Take 250 mg by mouth at bedtime. Takes 2 tablets    . HYDROcodone-acetaminophen (NORCO/VICODIN) 5-325 MG tablet Take 1 tablet by mouth every 6 (six) hours as needed for moderate pain. 30 tablet 0  . Ibuprofen (ADVIL) 200 MG CAPS Take 2 capsules by mouth  as needed. Reported on 07/08/2015    . ondansetron (ZOFRAN) 4 MG tablet Take 1 tablet (4 mg total) by mouth every 6 (six) hours as needed for nausea. (Patient not taking: Reported on 07/08/2015) 20 tablet 0  . senna-docusate (SENOKOT-S) 8.6-50 MG tablet Take 1 tablet by mouth 2 (two)  times daily. (Patient not taking: Reported on 07/08/2015) 60 tablet 2   No current facility-administered medications for this encounter.    REVIEW OF SYSTEMS:  A 15 point review of systems is documented in the electronic medical record.   PHYSICAL EXAM:                                 BP 107/60 mmHg  Pulse 94  Temp(Src) 98 F (36.7 C) (Oral)  Ht 5\' 7"  (1.702 m)  Wt 132 lb 1.6 oz (59.92 kg)  BMI 20.68 kg/m2  SpO2 100% General: Alert and oriented, in no acute distress, accompanied by husband on evaluation today. Communication is with the assistance of a sign language interpreter HEENT: Head is normocephalic. Extraocular movements are intact. Oropharynx is clear. Neck: Neck is supple, no palpable cervical or supraclavicular lymphadenopathy. Heart: Regular in rate and rhythm with no murmurs, rubs, or gallops. Chest: Clear to auscultation bilaterally, with no rhonchi, wheezes, or rales.  Left breast shows a small scar from prior Port-A-Cath placement, The right chest region shows reconstructed breast. No palpable or visible signs of recurrence Abdomen: Soft, nontender, nondistended, with no rigidity or guarding. Extremities: No cyanosis or edema. Several bandages noted along the left lateral upper leg area which were not removed for exam. Lymphatics: see Neck Exam Skin: No concerning lesions. Musculoskeletal: symmetric strength and muscle tone throughout except for slight weakness in the left proximal muscle groups, likely related to pain issues Neurologic: Cranial nerves II through XII are grossly intact. No obvious focalities. . Coordination is intact. Psychiatric: Judgment and insight are intact. Affect is appropriate.    LABORATORY DATA:  Lab Results  Component Value Date   WBC 6.9 07/02/2015   HGB 9.1* 07/02/2015   HCT 27.9* 07/02/2015   MCV 87.5 07/02/2015   PLT 288 07/02/2015   NEUTROABS 2.9 03/25/2015   Lab Results  Component Value Date   NA 141 07/08/2015   K  4.0 07/08/2015   CL 103 07/02/2015   CO2 28 07/08/2015   GLUCOSE 105 07/08/2015   CREATININE 0.9 07/08/2015   CALCIUM 9.8 07/08/2015      RADIOGRAPHY: Ct Chest W Contrast  06/28/2015  CLINICAL DATA:  Breast cancer staging.  No current chest complaints. EXAM: CT CHEST WITH CONTRAST TECHNIQUE: Multidetector CT imaging of the chest was performed during intravenous contrast administration. CONTRAST:  81mL ISOVUE-300 IOPAMIDOL (ISOVUE-300) INJECTION 61% COMPARISON:  Radiographs 06/25/2015. CT chest 08/29/2012 and abdomen pelvis CT 09/25/2013 and 06/10/2015. FINDINGS: Cardiovascular: The heart size is normal. There is no pericardial effusion. There are no significant vascular findings. Mediastinum/Nodes: There are no enlarged mediastinal, hilar or axillary lymph nodes.There are small right axillary lymph nodes status post right axillary node dissection. There are multiple enlarged right internal mammary chain lymph nodes, measuring up to 2.6 x 1.4 cm on image 71. There is an indeterminate 14 x 8 mm right thyroid nodule on image number 5, not imaged on the previous chest CT. Lungs/Pleura: There are trace bilateral pleural effusions with mild dependent atelectasis at both lung bases. No suspicious pulmonary nodule or confluent airspace opacity identified. Upper  abdomen: 9 mm hypervascular lesion posteriorly in the right hepatic lobe on image 86 is grossly stable from 2015, probably a hemangioma or other incidental vascular lesion. There is stable probable cyst on image 102. No evidence of adrenal mass. Musculoskeletal/Chest wall: There is a nondisplaced fracture involving the T1 spinous process. There is underlying irregular lucency within the posterior elements at T1, asymmetric to the left, and this is suspicious for a pathologic fracture. No other lytic lesions or fractures are seen. The sternum is intact. No apparent epidural tumor. IMPRESSION: 1. Extensive right internal mammary chain lymphadenopathy  consistent with metastatic breast cancer. 2. Lytic metastases involving the posterior elements at T1 with probable nondisplaced pathologic fracture of the spinous process. 3. No other evidence of thoracic metastatic disease. 4. Trace bilateral pleural effusions with associated bibasilar atelectasis. 5. Indeterminate right thyroid nodule, not previously imaged. This could be evaluated with thyroid ultrasound as clinically warranted. Electronically Signed   By: Richardean Sale M.D.   On: 06/28/2015 12:45   Mr Hip Left W Wo Contrast  06/25/2015  CLINICAL DATA:  Left hip pain for 3 months. History of breast cancer EXAM: MRI OF THE LEFT HIP WITHOUT AND WITH CONTRAST TECHNIQUE: Multiplanar, multisequence MR imaging was performed both before and after administration of intravenous contrast. CONTRAST:  69mL MULTIHANCE GADOBENATE DIMEGLUMINE 529 MG/ML IV SOLN COMPARISON:  06/10/2015 FINDINGS: Please note that in order to encompasses much of the osseous metastatic disease is possible, large field of view images were employed even on the sagittal images, a deviation from the normal orthopedic a protocol. This covers more volume but does inevitably result in reduced spatial resolution. Bones: Widespread osseous metastatic disease throughout the visualized pelvis. Index right posterior acetabular wall lesion 2.3 by 1.1 cm on image 9 series 5. There is particularly extensive tumor in the left proximal femoral neck, intertrochanteric region, and especially the proximal 15 cm of the shaft in which the medullary space it is replaced by tumor. There is periostitis and potentially early periosteal spread of tumor specially anteriorly in the proximal femoral metaphysis where there is moth-eaten endosteal scalloping and probable mild cortical breakthrough. Low-level edema and adjacent anterior musculature in this vicinity. My opinion is that structural integrity of the proximal femur is likely compromised, with increased risk of  fracture. There is considerable tumor in the left anterior acetabular wall. Multiple foci of tumor present in the right proximal femur, the largest measuring 1.6 cm in the right femoral shaft on image 16/5. These osseous metastatic lesions are surprisingly difficult to visualize on the CT scan. Articular cartilage and labrum Articular cartilage: Grossly intact, reduced sensitivity due to reduce spatial resolution. Labrum: No obvious paralabral cyst, sensitivity for labral tear is significantly reduced by the large field of view employed. Joint or bursal effusion Joint effusion:  Absent Bursae:  No regional bursitis Muscles and tendons Muscles and tendons: Aside from the low-level edema along the left iliopsoas and vastus intermedius musculature proximally due to tumor involvement of the cortex and periosteum of the left proximal femur, muscles and tendons appear unremarkable. Other findings Miscellaneous: No pathologic adenopathy in the pelvis is identified. There are a few sigmoid colon diverticula. IMPRESSION: 1. Unfortunately there is widespread bony metastatic disease with numerous foci of tumor throughout the bony pelvis and femurs. Confluent tumor in the left proximal femoral intertrochanteric region in shaft, where the medullary space is completely replaced by tumor over a 15 cm excursion, and where there some anterior bony cortical destruction, periosteal reaction, and  possibly early periosteal extension of tumor along the proximal tibial metaphysis and shaft especially anteriorly. Low-level edema in the adjacent vastus intermedius and iliopsoas musculature in this area. In general the various tumor deposits are occult and/or highly inconspicuous on CT ; given the extensive tumor in the bony pelvis and hips, I would expect similar widespread osseous metastatic disease elsewhere in the axial and appendicular skeleton. Further workup with whole-body bone scan, nuclear medicine PET-CT, and/or MRI workup may be  warranted. These results will be called to the ordering clinician or representative by the Radiologist Assistant, and communication documented in the PACS or zVision Dashboard. Electronically Signed   By: Van Clines M.D.   On: 06/25/2015 11:17   Nm Bone Scan Whole Body  07/01/2015  CLINICAL DATA:  Breast cancer. EXAM: NUCLEAR MEDICINE WHOLE BODY BONE SCAN TECHNIQUE: Whole body anterior and posterior images were obtained approximately 3 hours after intravenous injection of radiopharmaceutical. RADIOPHARMACEUTICALS:  27.2 mCi Technetium-66m MDP IV COMPARISON:  CT 06/28/2015.  Left femur 06/26/2015. FINDINGS: Bilateral renal function excretion.Activity noted about the left femur is noted. Although metastatic disease cannot be excluded these changes are most likely secondary to prior surgery. No other focal bony abnormalities noted to suggest metastatic disease. IMPRESSION: 1. Increased activity noted throughout the left femur. Although metastatic disease cannot be excluded, these changes are most likely secondary to prior surgery. 2. No other focal abnormalities identified to suggest metastatic disease. Electronically Signed   By: Marcello Moores  Register   On: 07/01/2015 15:10   Ct Abdomen Pelvis W Contrast  06/10/2015  CLINICAL DATA:  Chronic intermittent left lower quadrant abdominal pain. Personal history of breast cancer. Initial encounter. EXAM: CT ABDOMEN AND PELVIS WITH CONTRAST TECHNIQUE: Multidetector CT imaging of the abdomen and pelvis was performed using the standard protocol following bolus administration of intravenous contrast. CONTRAST:  100 mL ISOVUE-300 IOPAMIDOL (ISOVUE-300) INJECTION 61% COMPARISON:  CT of the abdomen and pelvis from 09/25/2013, and abdominal ultrasound performed 05/09/2005 FINDINGS: Minimal bibasilar atelectasis is noted. A 6 mm hypodensity is noted within the liver. A few small calcified granulomata are noted within the spleen. The liver and spleen are otherwise  unremarkable. The mildly heterogeneous appearance of the spleen likely reflects the phase of contrast enhancement. The patient is status post cholecystectomy, with clips noted at the gallbladder fossa. The pancreas and adrenal glands are unremarkable. A 1.1 cm left renal cyst is noted. The kidneys are otherwise unremarkable. There is no evidence of hydronephrosis. No renal or ureteral stones are seen. No perinephric stranding is appreciated. No free fluid is identified. The small bowel is unremarkable in appearance. The stomach is within normal limits. No acute vascular abnormalities are seen. The appendix is normal in caliber, without evidence of appendicitis. The colon is grossly unremarkable in appearance. The bladder is mildly distended and grossly unremarkable. The uterus is unremarkable in appearance. The ovaries are grossly symmetric. No suspicious adnexal masses are seen. No inguinal lymphadenopathy is seen. No acute osseous abnormalities are identified. IMPRESSION: 1. No acute abnormality seen within the abdomen or pelvis. 2. Small left renal cyst noted. 3. 6 mm nonspecific hypodensity within the liver has changed only minimally in size from 2015 and is likely benign. Electronically Signed   By: Garald Balding M.D.   On: 06/10/2015 02:03   Dg Chest Port 1v Same Day  06/25/2015  CLINICAL DATA:  Left hip pain.  Bone metastasis on MRI. EXAM: PORTABLE CHEST 1 VIEW COMPARISON:  02/18/2008 FINDINGS: Shallow inspiration. Heart size and  pulmonary vascularity are normal. Lungs appear clear and expanded. Postoperative changes with right mastectomy and surgical clips in the right axilla. Surgical clips in the right upper quadrant. Left posterior fourth rib expansile process again demonstrated. Visualized bones appear otherwise intact. Consider bone scan for more sensitive evaluation of bone metastasis. IMPRESSION: No evidence of active pulmonary disease. Possible expansile lesion in the left posterior fourth rib.  Electronically Signed   By: Lucienne Capers M.D.   On: 06/25/2015 22:17   Dg Tibia/fibula Left Port  06/25/2015  CLINICAL DATA:  Assess for metastatic disease to the bone. Initial encounter. EXAM: PORTABLE LEFT TIBIA AND FIBULA - 2 VIEW COMPARISON:  None. FINDINGS: A small bone island is noted at the proximal tibia. There is no evidence of metastatic disease to the bone on radiograph. The tibia and fibula appear grossly intact. The knee joint is incompletely assessed, but appears grossly unremarkable. No definite soft tissue abnormalities are characterized on radiograph. IMPRESSION: No evidence of metastatic disease to the tibia or fibula on radiograph. Electronically Signed   By: Garald Balding M.D.   On: 06/25/2015 22:15   Dg Tibia/fibula Right Port  06/25/2015  CLINICAL DATA:  Assess for metastatic disease to the bone. Initial encounter. EXAM: PORTABLE RIGHT TIBIA AND FIBULA - 2 VIEW COMPARISON:  Right ankle radiographs performed 09/04/2004 FINDINGS: There is no evidence of metastatic disease to the bone. The tibia and fibula appear intact. The ankle joint is grossly unremarkable. The knee joint is incompletely assessed, but appears grossly unremarkable. No definite soft tissue abnormalities are characterized on radiograph. IMPRESSION: No evidence of metastatic disease to the tibia or fibula on radiograph. Electronically Signed   By: Garald Balding M.D.   On: 06/25/2015 22:16   Cerv Spine Port(clearing)  06/25/2015  CLINICAL DATA:  Left hip pain.  Suspected bone metastasis. EXAM: PORTABLE CERVICAL SPINE (CLEARING) COMPARISON:  None. FINDINGS: Limited portable AP and cross-table lateral views of the cervical spine are obtained. Visualized cervical vertebrae demonstrate normal alignment. No compression deformities. Suggestion of lucent infiltrating pattern in the transverse process of T1 on the left possibly indicating metastasis. Focal expansile lesion suggested in the posterior left fourth rib. Consider  bone scan for more sensitive evaluation. IMPRESSION: Normal alignment of the visualized cervical spine. Possible metastatic lesions in the left transverse process at T1 and the posterior left fourth rib. Electronically Signed   By: Lucienne Capers M.D.   On: 06/25/2015 22:15   Dg Humerus Left  06/25/2015  CLINICAL DATA:  Left hip pain.  Bone metastases. EXAM: LEFT HUMERUS - 2+ VIEW COMPARISON:  None. FINDINGS: Single View of the left humerus demonstrates no focal bone lesions. IMPRESSION: No focal bone lesions demonstrated on single view left humerus. Electronically Signed   By: Lucienne Capers M.D.   On: 06/25/2015 22:18   Dg Humerus Right  06/25/2015  CLINICAL DATA:  Left hip pain.  Bone metastasis. EXAM: RIGHT HUMERUS - 2+ VIEW COMPARISON:  None. FINDINGS: There is no evidence of fracture or other focal bone lesions demonstrated on single view of the right humerus. Soft tissues are unremarkable. IMPRESSION: No focal bone lesions demonstrated on single view right humerus. Electronically Signed   By: Lucienne Capers M.D.   On: 06/25/2015 22:19   Dg C-arm 1-60 Min  06/26/2015  CLINICAL DATA:  Breast cancer. Left femur metastasis. Left femoral nail placement. EXAM: DG C-ARM 61-120 MIN; LEFT FEMUR 2 VIEWS COMPARISON:  06/26/2015 left femur radiographs. FINDINGS: Fluoroscopy time 57 seconds. Five nondiagnostic  spot fluoroscopic intraoperative radiographs of the left femur demonstrate postsurgical changes from placement of intramedullary rod in the left femoral shaft with interlocking left femoral neck pin and distal interlocking screws. IMPRESSION: Intraoperative fluoroscopic guidance for intramedullary rod placement in the left femoral shaft. Electronically Signed   By: Ilona Sorrel M.D.   On: 06/26/2015 17:50   Dg Femur Port, 1v Right  06/25/2015  CLINICAL DATA:  Assess for metastatic disease to the bone. Initial encounter. EXAM: RIGHT FEMUR PORTABLE 1 VIEW COMPARISON:  None. FINDINGS: The right femur  is unremarkable in appearance, aside from a small subcortical cyst at the femoral neck. There is no evidence of fracture or dislocation. No lytic or blastic metastases are seen within the visualized osseous structures. The knee joint is grossly unremarkable, though incompletely assessed. The right femoral head remains seated at the acetabulum. Postoperative change is noted at the right hemipelvis. No definite soft tissue abnormalities are characterized on radiograph. IMPRESSION: No evidence of fracture or dislocation. No metastatic bony lesions seen on radiograph. Electronically Signed   By: Garald Balding M.D.   On: 06/25/2015 22:11   Dg Femur Min 2 Views Left  06/26/2015  CLINICAL DATA:  Breast cancer. Left femur metastasis. Left femoral nail placement. EXAM: DG C-ARM 61-120 MIN; LEFT FEMUR 2 VIEWS COMPARISON:  06/26/2015 left femur radiographs. FINDINGS: Fluoroscopy time 57 seconds. Five nondiagnostic spot fluoroscopic intraoperative radiographs of the left femur demonstrate postsurgical changes from placement of intramedullary rod in the left femoral shaft with interlocking left femoral neck pin and distal interlocking screws. IMPRESSION: Intraoperative fluoroscopic guidance for intramedullary rod placement in the left femoral shaft. Electronically Signed   By: Ilona Sorrel M.D.   On: 06/26/2015 17:50   Dg Femur Port Min 2 Views Left  06/26/2015  CLINICAL DATA:  Slip and fall 1 month ago with proximal left femur pain, known metastatic disease EXAM: LEFT FEMUR PORTABLE 2 VIEWS COMPARISON:  None. FINDINGS: No definitive acute fracture or dislocation is noted. There is diffuse multifocal lucencies the within the proximal femur similar to the changes seen on recent MRI consistent with metastatic disease. Some similar changes are noted in the mid femur but to a lesser degree. Knee joint appears within normal limits. IMPRESSION: Widespread metastatic disease throughout the left femur most notable in the proximal  femur. Electronically Signed   By: Inez Catalina M.D.   On: 06/26/2015 10:30      IMPRESSION:   Recurrent breast cancer with extensive osseous metastasis.  chest CT scan also shows internal mammary node involvement along the right side, consistent with her prior history of right-sided breast cancer.  She would likely benefit from post-op radiation treatments directed at the left femur for tumor control and pain issues.  PLAN: Patient will return tomorrow for CT simulation with treatments to begin early next week. She will see her orthopedic surgeon later this week with staple removal. Anticipate  10 treatments. She will also meet with medical oncology in the near future to discuss systemic therapy.  ------------------------------------------------  Blair Promise, PhD, MD

## 2015-07-08 NOTE — Telephone Encounter (Signed)
Sent patient a text message to please come in at 2:45 for labs prior to seeing Dr Earney Hamburg

## 2015-07-08 NOTE — Telephone Encounter (Signed)
-----   Message from Gordy Levan, MD sent at 07/07/2015 10:40 AM EDT ----- Metastatic breast.  Patient is deaf. Given pamidronate AND zometa in hospital last week in error. To see Dr Sondra Come 6-26 at 4:00 Please get stat CMET Mg prior to seeing Dr Sondra Come on 6-26. I need to be sure to see results. Order in, will need lab apt. Need to let patient know  Let me know if not able to get this done as will need to be in touch with [facility] for their 6-27 apt if not, but hopefully can do it

## 2015-07-08 NOTE — Telephone Encounter (Signed)
Added lab per pof, pt aware

## 2015-07-08 NOTE — Progress Notes (Signed)
Please see the Nurse Progress Note in the MD Initial Consult Encounter for this patient. 

## 2015-07-08 NOTE — Progress Notes (Signed)
Histology and Location of Primary Cancer: Probable recurrent breast cancer with extensive osseous metastasis   Location(s) of Symptomatic Metastases: left pelvis and left femur  Biopsy revealed:  06/26/15 Diagnosis Bone, curettage, Left femur met, intramedullary subtroch tissue - METASTATIC ADENOCARCINOMA.  Past surgeries: 06/26/15 -Procedure: LEFT BIOMET LONG AFFIXS NAIL;  Surgeon: [name], MD  Past/Anticipated chemotherapy by medical oncology, if any: apt with Dr. Marko Plume on 07/12/15  Pain on a scale of 0-10 is: 6/10 in her left groin. reports she stepped wrong and pain is worse today.   Ambulatory status? Walker? Wheelchair?: walker  SAFETY ISSUES:  Prior radiation? no  Pacemaker/ICD? no  Possible current pregnancy? no  Is the patient on methotrexate? no  Current Complaints / other details:  Patient is here with her husband and interpreter.  BP 107/60 mmHg  Pulse 94  Temp(Src) 98 F (36.7 C) (Oral)  Ht 5' 7"  (1.702 m)  Wt 132 lb 1.6 oz (59.92 kg)  BMI 20.68 kg/m2  SpO2 100%

## 2015-07-09 ENCOUNTER — Telehealth: Payer: Self-pay | Admitting: Oncology

## 2015-07-09 ENCOUNTER — Ambulatory Visit (INDEPENDENT_AMBULATORY_CARE_PROVIDER_SITE_OTHER): Payer: Medicaid Other | Admitting: Family Medicine

## 2015-07-09 ENCOUNTER — Ambulatory Visit
Admission: RE | Admit: 2015-07-09 | Discharge: 2015-07-09 | Disposition: A | Discharge status: Home / Self Care | Payer: Medicaid Other | Source: Ambulatory Visit | Attending: Radiation Oncology | Admitting: Radiation Oncology

## 2015-07-09 ENCOUNTER — Telehealth: Payer: Self-pay

## 2015-07-09 ENCOUNTER — Encounter: Payer: Self-pay | Admitting: Family Medicine

## 2015-07-09 VITALS — BP 98/51 | HR 88 | Temp 97.6°F | Ht 67.0 in | Wt 131.2 lb

## 2015-07-09 DIAGNOSIS — M25552 Pain in left hip: Secondary | ICD-10-CM | POA: Diagnosis present

## 2015-07-09 DIAGNOSIS — C50919 Malignant neoplasm of unspecified site of unspecified female breast: Secondary | ICD-10-CM | POA: Diagnosis not present

## 2015-07-09 DIAGNOSIS — C799 Secondary malignant neoplasm of unspecified site: Secondary | ICD-10-CM | POA: Diagnosis not present

## 2015-07-09 DIAGNOSIS — E041 Nontoxic single thyroid nodule: Secondary | ICD-10-CM | POA: Diagnosis not present

## 2015-07-09 DIAGNOSIS — C7951 Secondary malignant neoplasm of bone: Secondary | ICD-10-CM

## 2015-07-09 DIAGNOSIS — Z510 Encounter for antineoplastic radiation therapy: Secondary | ICD-10-CM | POA: Diagnosis not present

## 2015-07-09 NOTE — Telephone Encounter (Signed)
lvm on husband cell per Dr Edwyna Shell attached message.

## 2015-07-09 NOTE — Patient Instructions (Signed)
Thank you for coming in today!  - We will be in contact with you about scheduling a physical therapist appointment.   Our clinic's number is [phone]. Feel free to call any time with questions or concerns. We will answer any questions after hours with our 24-hour emergency line at that number as well.   - Dr. Bonner Puna

## 2015-07-09 NOTE — Progress Notes (Signed)
  Radiation Oncology         [phone] ________________________________  Name: [name] MRN: [id number]  Date: 07/09/2015  DOB: [birth year]  SIMULATION AND TREATMENT PLANNING NOTE    ICD-9-CM ICD-10-CM   1. Bony metastasis (HCC) 198.5 C79.51     DIAGNOSIS:  recurrent breast cancer with extensive osseous metastasis   NARRATIVE:  The patient was brought to the [place] suite.  Identity was confirmed.  All relevant records and images related to the planned course of therapy were reviewed.  The patient freely provided informed written consent to proceed with treatment after reviewing the details related to the planned course of therapy. The consent form was witnessed and verified by the simulation staff.  Then, the patient was set-up in a stable reproducible  supine position for radiation therapy.  CT images were obtained.  Surface markings were placed.  The CT images were loaded into the planning software.  Then the target and avoidance structures were contoured.  Treatment planning then occurred.  The radiation prescription was entered and confirmed.  Then, I designed and supervised the construction of a total of 3 medically necessary complex treatment devices.  I have requested : Isodose Plan.  I have ordered:dose calc.  PLAN:  The patient will receive 30 Gy in 10 fractions Directed at the left femur.  ________________________________  -----------------------------------  Blair Promise, PhD, MD

## 2015-07-09 NOTE — Telephone Encounter (Signed)
-----   Message from Gordy Levan, MD sent at 07/08/2015  5:07 PM EDT ----- Patient is deaf; I believe Tammi texted her on 6-26.  Please let her know that her blood chemistries were all ok on 6-26, this in follow up of the bone treatment she had in hospital last week.  Please remind her of apt with LL + lab on 6-30  Thank you

## 2015-07-09 NOTE — Progress Notes (Signed)
Subjective: [name] is a 50 y.o. female returning for hospital follow up.   She recently was discharged after an admission related to finding widespread metastatic adenocarcinoma, principally a bony metastasis in the left hip requiring surgery to prevent imminent pathologic fracture. This was performed and she reports her pain has improved. She has some itching at the surgical site, but no redness or drainage. Getting sutures taken out at Dr. [name]' office this Friday.   She's pursuing physical therapy, but declined home health PT because she says it's too much happening in her home (grandchildren, dog, etc.). She has not yet established at a physical therapist's site. Her pain is improving, but still present, improved with hydrocodone which she takes about 1 - 2 times per day.   - ROS: She has no other complaints. Specifically, no fever, headache, cough, CP, palpitations, SOB, abdominal pain, N/V/D, blood in stool, change in bladder habits, myalgias, arthralgias, or rash.  - Non-smoker  Objective: BP 98/51 mmHg  Pulse 88  Temp(Src) 97.6 F (36.4 C) (Oral)  Ht 5\' 7"  (1.702 m)  Wt 131 lb 3.2 oz (59.512 kg)  BMI 20.54 kg/m2 Gen: Thing, well-appearing 50 y.o. female in no distress Neck: supple, no palpable enlargement or nodularity of thyroid.  CV: Regular rate, no murmur; radial, DP and PT pulses 2+ bilaterally; no LE edema, no JVD, cap refill < 2 sec. Pulm: Non-labored breathing ambient air; CTAB, no wheezes or crackles MSK: Antalgic gait with rolling walker Skin: Left lateral thigh with linear incision well healed without erythema or drainage.   Assessment/Plan: [name] is a 50 y.o. female here for complications of metastatic cancer.  Metastasis from breast cancer [facility]) Presumably adenocarcinoma metastasized to axial and appendicular skeleton. To establish outpatient care with oncologist later this week.   Left hip pain s/p trochanteric nail placement to prevent  pathologic left femur fracture.  - Physical therapy referral: [facility] is located near her. Will definitely need an interpretor scheduled at this same time.  - Pain controlled, seemingly primarily due to post-op pain. Will defer to surgery for pain management. She is fine until follow up 6/30.   Thyroid nodule CT CHEST 6/16: indeterminate 14 x 8 mm right thyroid nodule not opreviously seen. TSH 1.184. After discussion of algorithmic approach to incidental thyroid nodule which would warrant U/S with FNA if this was highly suspicious for CA, pt opts to reevaluate this in 6 months; pending evaluation and management of widespread metastatic disease.   - Would prefer female providers if possible, specifically had a good relationship with Dr. Brett Albino during recent hospitalization and would like to establish care with her.

## 2015-07-09 NOTE — Telephone Encounter (Signed)
Left a message for Mikelle asking if she would be able to have CT SIM today at 2 pm.  Requested a return call.

## 2015-07-09 NOTE — Assessment & Plan Note (Signed)
Presumably adenocarcinoma metastasized to axial and appendicular skeleton. To establish outpatient care with oncologist later this week.

## 2015-07-09 NOTE — Telephone Encounter (Signed)
Medical Oncology  Spoke with [facility] pathology to request PR and HER 2 on [id number] from 06-26-15.  Godfrey Pick, MD

## 2015-07-09 NOTE — Assessment & Plan Note (Signed)
s/p trochanteric nail placement to prevent pathologic left femur fracture.  - Physical therapy referral: [facility] is located near her. Will definitely need an interpretor scheduled at this same time.  - Pain controlled, seemingly primarily due to post-op pain. Will defer to surgery for pain management. She is fine until follow up 6/30.

## 2015-07-09 NOTE — Assessment & Plan Note (Signed)
CT CHEST 6/16: indeterminate 14 x 8 mm right thyroid nodule not opreviously seen. TSH 1.184. After discussion of algorithmic approach to incidental thyroid nodule which would warrant U/S with FNA if this was highly suspicious for CA, pt opts to reevaluate this in 6 months; pending evaluation and management of widespread metastatic disease.

## 2015-07-10 ENCOUNTER — Telehealth: Payer: Self-pay | Admitting: *Deleted

## 2015-07-10 ENCOUNTER — Telehealth: Payer: Self-pay | Admitting: Oncology

## 2015-07-10 DIAGNOSIS — Z510 Encounter for antineoplastic radiation therapy: Secondary | ICD-10-CM | POA: Diagnosis not present

## 2015-07-10 NOTE — Telephone Encounter (Signed)
I did speak with Kennyth Lose and Janett Billow and did get it changed to Dr. Brett Albino. Katharina Caper, [place], [place]

## 2015-07-10 NOTE — Telephone Encounter (Signed)
[name] called back and said [name] still has sutures which are due to be removed on Friday at 3:30.  She said Dr. [name] stated that it would be OK to start radiation as long as the incisions look OK and the sutures would not get in the way of radiation.  Verbalized agreement and understanding.

## 2015-07-10 NOTE — Telephone Encounter (Signed)
Called Dr. [name]' office and spoke to his nurse [facility]  Asked it if it is OK to start radation on Friday, 07/12/15.  Gwinda Passe said she will ask Dr. [name] and call us back.

## 2015-07-10 NOTE — Telephone Encounter (Signed)
-----   Message from Patrecia Pour, MD sent at 07/09/2015  1:49 PM EDT ----- [name], she spent a lot of time with Dr. Brett Albino while in the hospital and would like to know if we could have her be the patient's PCP?

## 2015-07-11 ENCOUNTER — Other Ambulatory Visit: Payer: Self-pay | Admitting: Oncology

## 2015-07-11 DIAGNOSIS — C50919 Malignant neoplasm of unspecified site of unspecified female breast: Secondary | ICD-10-CM

## 2015-07-11 DIAGNOSIS — C7951 Secondary malignant neoplasm of bone: Secondary | ICD-10-CM

## 2015-07-11 DIAGNOSIS — C799 Secondary malignant neoplasm of unspecified site: Principal | ICD-10-CM

## 2015-07-12 ENCOUNTER — Other Ambulatory Visit (HOSPITAL_BASED_OUTPATIENT_CLINIC_OR_DEPARTMENT_OTHER): Payer: Medicaid Other

## 2015-07-12 ENCOUNTER — Ambulatory Visit
Admission: RE | Admit: 2015-07-12 | Discharge: 2015-07-12 | Disposition: A | Discharge status: Home / Self Care | Payer: Medicaid Other | Source: Ambulatory Visit | Attending: Radiation Oncology | Admitting: Radiation Oncology

## 2015-07-12 ENCOUNTER — Ambulatory Visit (HOSPITAL_BASED_OUTPATIENT_CLINIC_OR_DEPARTMENT_OTHER): Payer: Medicaid Other | Admitting: Oncology

## 2015-07-12 ENCOUNTER — Telehealth: Payer: Self-pay | Admitting: Oncology

## 2015-07-12 ENCOUNTER — Encounter: Payer: Self-pay | Admitting: Radiation Oncology

## 2015-07-12 ENCOUNTER — Encounter: Payer: Self-pay | Admitting: Oncology

## 2015-07-12 VITALS — BP 118/82 | HR 77 | Temp 97.4°F | Ht 67.0 in | Wt 132.1 lb

## 2015-07-12 VITALS — BP 112/68 | HR 77 | Temp 97.5°F | Resp 18 | Wt 131.4 lb

## 2015-07-12 DIAGNOSIS — E8940 Asymptomatic postprocedural ovarian failure: Secondary | ICD-10-CM

## 2015-07-12 DIAGNOSIS — C419 Malignant neoplasm of bone and articular cartilage, unspecified: Secondary | ICD-10-CM

## 2015-07-12 DIAGNOSIS — D509 Iron deficiency anemia, unspecified: Secondary | ICD-10-CM

## 2015-07-12 DIAGNOSIS — C50911 Malignant neoplasm of unspecified site of right female breast: Secondary | ICD-10-CM

## 2015-07-12 DIAGNOSIS — Z9079 Acquired absence of other genital organ(s): Secondary | ICD-10-CM

## 2015-07-12 DIAGNOSIS — C7951 Secondary malignant neoplasm of bone: Secondary | ICD-10-CM

## 2015-07-12 DIAGNOSIS — H8110 Benign paroxysmal vertigo, unspecified ear: Secondary | ICD-10-CM | POA: Diagnosis not present

## 2015-07-12 DIAGNOSIS — Z9889 Other specified postprocedural states: Secondary | ICD-10-CM

## 2015-07-12 DIAGNOSIS — C801 Malignant (primary) neoplasm, unspecified: Secondary | ICD-10-CM | POA: Diagnosis present

## 2015-07-12 DIAGNOSIS — Z9049 Acquired absence of other specified parts of digestive tract: Secondary | ICD-10-CM

## 2015-07-12 DIAGNOSIS — D508 Other iron deficiency anemias: Secondary | ICD-10-CM

## 2015-07-12 DIAGNOSIS — Z90722 Acquired absence of ovaries, bilateral: Secondary | ICD-10-CM

## 2015-07-12 DIAGNOSIS — Z510 Encounter for antineoplastic radiation therapy: Secondary | ICD-10-CM | POA: Diagnosis not present

## 2015-07-12 DIAGNOSIS — F40240 Claustrophobia: Secondary | ICD-10-CM | POA: Diagnosis not present

## 2015-07-12 DIAGNOSIS — H905 Unspecified sensorineural hearing loss: Secondary | ICD-10-CM

## 2015-07-12 DIAGNOSIS — C799 Secondary malignant neoplasm of unspecified site: Principal | ICD-10-CM

## 2015-07-12 DIAGNOSIS — C50919 Malignant neoplasm of unspecified site of unspecified female breast: Secondary | ICD-10-CM

## 2015-07-12 LAB — COMPREHENSIVE METABOLIC PANEL
ALT: 11 U/L (ref 0–55)
AST: 11 U/L (ref 5–34)
Albumin: 3.6 g/dL (ref 3.5–5.0)
Alkaline Phosphatase: 119 U/L (ref 40–150)
Anion Gap: 9 mEq/L (ref 3–11)
BUN: 11.2 mg/dL (ref 7.0–26.0)
CALCIUM: 9.5 mg/dL (ref 8.4–10.4)
CHLORIDE: 103 meq/L (ref 98–109)
CO2: 27 meq/L (ref 22–29)
CREATININE: 0.8 mg/dL (ref 0.6–1.1)
EGFR: 82 mL/min/{1.73_m2} — ABNORMAL LOW (ref >90)
GLUCOSE: 95 mg/dL (ref 70–140)
Potassium: 4.4 mEq/L (ref 3.5–5.1)
SODIUM: 140 meq/L (ref 136–145)
Total Bilirubin: 0.8 mg/dL (ref 0.20–1.20)
Total Protein: 7.8 g/dL (ref 6.4–8.3)

## 2015-07-12 LAB — CBC WITH DIFFERENTIAL/PLATELET
BASO%: 0.8 % (ref 0.0–2.0)
Basophils Absolute: 0.1 10*3/uL (ref 0.0–0.1)
EOS%: 1.9 % (ref 0.0–7.0)
Eosinophils Absolute: 0.1 10*3/uL (ref 0.0–0.5)
HEMATOCRIT: 33.4 % — AB (ref 34.8–46.6)
HEMOGLOBIN: 11 g/dL — AB (ref 11.6–15.9)
LYMPH#: 1.6 10*3/uL (ref 0.9–3.3)
LYMPH%: 23.2 % (ref 14.0–49.7)
MCH: 29 pg (ref 25.1–34.0)
MCHC: 33.1 g/dL (ref 31.5–36.0)
MCV: 87.8 fL (ref 79.5–101.0)
MONO#: 0.3 10*3/uL (ref 0.1–0.9)
MONO%: 4.9 % (ref 0.0–14.0)
NEUT#: 4.6 10*3/uL (ref 1.5–6.5)
NEUT%: 69.2 % (ref 38.4–76.8)
Platelets: 364 10*3/uL (ref 145–400)
RBC: 3.8 10*6/uL (ref 3.70–5.45)
RDW: 14 % (ref 11.2–14.5)
WBC: 6.7 10*3/uL (ref 3.9–10.3)

## 2015-07-12 NOTE — Progress Notes (Signed)
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   Patient developed left hip pain spring 2017, with plain xrays 06-06-15 negative, CT AP with contrast 06-10-15 negative, and MRI 06-25-15  " Widespread osseous metastatic disease throughout the visualized pelvis. Index right posterior acetabular wall lesion 2.3 by 1.1 cm on image 9 series 5.There is particularly extensive tumor in the left proximal femoral neck, intertrochanteric region, and especially the proximal 15 cm of the shaft in which the medullary space it is replaced by tumor. There is periostitis and potentially early periosteal spread of tumor specially anteriorly in the proximal femoral metaphysis where there is moth-eaten endosteal scalloping and probable mild cortical breakthrough. Low-level edema and adjacent anterior musculature in this vicinity. My opinion is that structural integrity of the proximal femur is likely compromised, with increased risk of fracture.There is considerable tumor in the left anterior acetabular wall. Multiple foci of tumor present in the right proximal femur, the largest measuring 1.6 cm in the right femoral shaft on image 16/5. These osseous metastatic lesions are surprisingly difficult to visualize on the CT scan.". She was admitted directly to [facility] from the MRI due to impending left femoral fracture, with nail stabilization done by Dr [name] on 06-26-15. Pathology (SZA [id number] from 06-26-15) shows metastatic adenocarcinoma which is ER + at 95%, PR 2% and HER 2 still not available. I have spoken directly with pathology now, HER 2 again requested. Note patient's breast cancer in 1999 was  ER PR and HER 2 positive. CT chest 06-28-15 did show enlarged right internal mammary nodes, trace bilateral pleural effusions, nondisplaced fracture spinous process T1 suspicious for pathologic fracture, indeterminate right thyroid nodule; CT AP had been done 06-10-15 with ED visit for the left hip pain. Patient has begun radiation by Dr Sondra Come to left femur area, planned thru 07-26-15. She has follow up with Dr [name] also 07-12-15 for suture removal; she is to begin outpatient PT in next ~ 2 weeks.  Patient received pamidronate in hospital 06-28-15, then also zometa in hospital 06-29-15; see my note 07-01-15. Calcium and other chemistries were monitored in hospital and rechecked on 07-08-15, fortunately no complications from that medication error. Plan is to continue to use IV bisphosphonate monthly. She had first dose feraheme in hospital 06-29-15 for iron deficiency anemia. She did not receive PRBCs in hospital.   Patient has been recovering well from surgery and is tolerating radiation also well, without significant fatigue or skin reaction. Pain is managed now with occasional advil, tho she does have hydrocodone available if needed. Appetite is better and she is able to sleep. She is ambulatory, has not had any outpatient PT yet. The sutures are itching in left thigh, to be removed today. She denies pain now in neck or back, no fever, bowels ok, no SOB. No bleeding. No swelling LE.  Remainder of 10 point Review of Systems negative/ unchanged.   No PAC Genetics testing negative by Breast Ovarian panel 03-2015 [facility] 03-15-15 heterogeneously dense tissue otherwise negative. PET ordered, preauth in progress, not yet scheduled. CA 2729 on 06-26-15:  72.5 CEA on 06-26-15:  9.1   Patient plans to go to a  Christian camp in [place] July 14-21. She requests all medical visits etc be scheduled to allow this trip.  ONCOLOGIC HISTORY Patient had 1.4 cm poorly differentiated right breast  carcinoma diagnosed in Nov 1999 at age 50, 1/7 axillary nodes involved, ER/PR and HER 2 positive. She had mastectomy with the 7 axillary node evaluation, 4 cycles of adriamycin/ cytoxan followed by taxotere, then five years of tamoxifen thru June 2005 (no herceptin in 1999). She had right TRAM reconstruction. She had bilateral salpingoophorectomy in May 2005. She was briefly on aromatase inhibitor after tamoxifen, but discontinued this herself due to poor tolerance. Most recent left mammogram was done as screening on 04-15-2012 with heterogeneously dense tissue and questioned asymmetry, followed by diagnostic left mamogram on 04-28-12 which had no findings of concern. 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 She had nailing of impending left femur fracture by Dr [name] on 06-26-15. Pathology [id number] from 06-26-15 showed metastatic adenocarcinoma , strongly ER positive, minimally PR positive, HER 2 pending. Plain xrays suggested T1 and left 4th rib involvement, tho these also did not show on bone scan 07-01-15.  Objective:  Vital signs in last 24 hours:  BP 112/68 mmHg  Pulse 77  Temp(Src) 97.5 F (36.4 C) (Oral)  Resp 18  Wt 131 lb 6.4 oz (59.603 kg)  SpO2 100%  Alert, oriented and appropriate. Ambulatory in exam room much more easily than previously, still favors LLE. Respirations not labored RA. She looks much more comfortable than when seen last in hospital, less pale, only a little tearful during discussion of life expectancy today. Husband very supportive.  HEENT:PERRL, sclerae not icteric. Oral mucosa moist without lesions, posterior pharynx clear.  Neck supple. No JVD.  Lymphatics:no cervical,supraclavicular, axillary adenopathy Resp: clear to auscultation bilaterally and normal percussion bilaterally Cardio: regular rate and rhythm. No gallop. GI: soft, nontender, not  distended, no mass or organomegaly. Normally active bowel sounds.  Musculoskeletal/ Extremities: without pitting edema, cords, tenderness. She is not undressed for me to see surgical incision, is to see Dr [name] after this visit today. Neuro: no peripheral neuropathy. Otherwise nonfocal Skin without rash, ecchymosis, petechiae. Looks less pale Breast exam not repeated today.  Lab Results:  Results for orders placed or performed in visit on 07/12/15  CBC with Differential  Result Value Ref Range   WBC 6.7 3.9 - 10.3 10e3/uL   NEUT# 4.6 1.5 - 6.5 10e3/uL   HGB 11.0 (L) 11.6 - 15.9 g/dL   HCT 33.4 (L) 34.8 - 46.6 %   Platelets 364 145 - 400 10e3/uL   MCV 87.8 79.5 - 101.0 fL   MCH 29.0 25.1 - 34.0 pg   MCHC 33.1 31.5 - 36.0 g/dL   RBC 3.80 3.70 - 5.45 10e6/uL   RDW 14.0 11.2 - 14.5 %   lymph# 1.6 0.9 - 3.3 10e3/uL   MONO# 0.3 0.1 - 0.9 10e3/uL   Eosinophils Absolute 0.1 0.0 - 0.5 10e3/uL   Basophils Absolute 0.1 0.0 - 0.1 10e3/uL   NEUT% 69.2 38.4 - 76.8 %   LYMPH% 23.2 14.0 - 49.7 %   MONO% 4.9 0.0 - 14.0 %   EOS% 1.9 0.0 - 7.0 %   BASO% 0.8 0.0 - 2.0 %  Comprehensive metabolic panel  Result Value Ref Range   Sodium 140 136 - 145 mEq/L   Potassium 4.4 3.5 - 5.1 mEq/L   Chloride 103 98 -  109 mEq/L   CO2 27 22 - 29 mEq/L   Glucose 95 70 - 140 mg/dl   BUN 11.2 7.0 - 26.0 mg/dL   Creatinine 0.8 0.6 - 1.1 mg/dL   Total Bilirubin 0.80 0.20 - 1.20 mg/dL   Alkaline Phosphatase 119 40 - 150 U/L   AST 11 5 - 34 U/L   ALT 11 0 - 55 U/L   Total Protein 7.8 6.4 - 8.3 g/dL   Albumin 3.6 3.5 - 5.0 g/dL   Calcium 9.5 8.4 - 10.4 mg/dL   Anion Gap 9 3 - 11 mEq/L   EGFR 82 (L) >90 ml/min/1.73 m2   iron studies 06-29-15 prior to first feraheme: serum iron 16, %sat 7. Hemoglobin up from 7.8 2 weeks ago. TSH normal on 06-29-15 CA 2729 on 06-26-15:  72.5 CEA on 06-26-15:  9.1  Studies/Results:  PATHOLOGY THEREDGE, Sheily C ([id number]) Patient: Lafave, Renley C Collected: 06/26/2015  Client: [facility] Accession: [id number] Received: 06/27/2015 Mark Yates L PATHOLOGY ADDITIONAL INFORMATION: PROGNOSTIC INDICATORS Results: IMMUNOHISTOCHEMICAL AND MORPHOMETRIC ANALYSIS PERFORMED MANUALLY Estrogen Receptor: 95%, POSITIVE, STRONG STAINING INTENSITY Progesterone Receptor: 2%, POSITIVE, STRONG STAINING INTENSITY REFERENCE RANGE ESTROGEN RECEPTOR NEGATIVE 0% POSITIVE =>1% REFERENCE RANGE PROGESTERONE RECEPTOR NEGATIVE 0% POSITIVE =>1% All controls stained appropriately  Bone, curettage, Left femur met, intramedullary subtroch tissue - METASTATIC ADENOCARCINOMA. Microscopic Comment The differential diagnosis includes metastatic breast carcinoma. Immunohistochemistry will be performed and reported as an addendum.   IMAGING EXAM: NUCLEAR MEDICINE WHOLE BODY BONE SCAN  TECHNIQUE: Whole body anterior and posterior images were obtained approximately 3 hours after intravenous injection of radiopharmaceutical.  RADIOPHARMACEUTICALS: 27.2 mCi Technetium-99m MDP IV  COMPARISON: CT 06/28/2015. Left femur 06/26/2015.  FINDINGS: Bilateral renal function excretion.Activity noted about the left femur is noted. Although metastatic disease cannot be excluded these changes are most likely secondary to prior surgery. No other focal bony abnormalities noted to suggest metastatic disease.  IMPRESSION: 1. Increased activity noted throughout the left femur. Although metastatic disease cannot be excluded, these changes are most likely secondary to prior surgery.  2. No other focal abnormalities identified to suggest metastatic disease.    EXAM: CT CHEST WITH CONTRAST  TECHNIQUE: Multidetector CT imaging of the chest was performed during intravenous contrast administration.  CONTRAST: 75mL ISOVUE-300 IOPAMIDOL (ISOVUE-300) INJECTION 61%  COMPARISON: Radiographs 06/25/2015. CT chest 08/29/2012 and abdomen pelvis CT 09/25/2013 and  06/10/2015.  FINDINGS: Cardiovascular: The heart size is normal. There is no pericardial effusion. There are no significant vascular findings.  Mediastinum/Nodes: There are no enlarged mediastinal, hilar or axillary lymph nodes.There are small right axillary lymph nodes status post right axillary node dissection. There are multiple enlarged right internal mammary chain lymph nodes, measuring up to 2.6 x 1.4 cm on image 71. There is an indeterminate 14 x 8 mm right thyroid nodule on image number 5, not imaged on the previous chest CT.  Lungs/Pleura: There are trace bilateral pleural effusions with mild dependent atelectasis at both lung bases. No suspicious pulmonary nodule or confluent airspace opacity identified.  Upper abdomen: 9 mm hypervascular lesion posteriorly in the right hepatic lobe on image 86 is grossly stable from 2015, probably a hemangioma or other incidental vascular lesion. There is stable probable cyst on image 102. No evidence of adrenal mass.  Musculoskeletal/Chest wall: There is a nondisplaced fracture involving the T1 spinous process. There is underlying irregular lucency within the posterior elements at T1, asymmetric to the left, and this is suspicious for a pathologic fracture. No other   lytic lesions or fractures are seen. The sternum is intact. No apparent epidural tumor.  IMPRESSION: 1. Extensive right internal mammary chain lymphadenopathy consistent with metastatic breast cancer. 2. Lytic metastases involving the posterior elements at T1 with probable nondisplaced pathologic fracture of the spinous process. 3. No other evidence of thoracic metastatic disease. 4. Trace bilateral pleural effusions with associated bibasilar atelectasis. 5. Indeterminate right thyroid nodule, not previously imaged. This could be evaluated with thyroid ultrasound as clinically warranted.  NOTE CT AP 06-10-15   PORTABLE CERVICAL SPINE (CLEARING)  COMPARISON:  None.  FINDINGS: Limited portable AP and cross-table lateral views of the cervical spine are obtained. Visualized cervical vertebrae demonstrate normal alignment. No compression deformities. Suggestion of lucent infiltrating pattern in the transverse process of T1 on the left possibly indicating metastasis. Focal expansile lesion suggested in the posterior left fourth rib. Consider bone scan for more sensitive evaluation.  IMPRESSION: Normal alignment of the visualized cervical spine. Possible metastatic lesions in the left transverse process at T1 and the posterior left fourth rib.   EXAM: CT CHEST WITH CONTRAST 06-28-15  COMPARISON: Radiographs 06/25/2015. CT chest 08/29/2012 and abdomen pelvis CT 09/25/2013 and 06/10/2015.  FINDINGS: Cardiovascular: The heart size is normal. There is no pericardial effusion. There are no significant vascular findings.  Mediastinum/Nodes: There are no enlarged mediastinal, hilar or axillary lymph nodes.There are small right axillary lymph nodes status post right axillary node dissection. There are multiple enlarged right internal mammary chain lymph nodes, measuring up to 2.6 x 1.4 cm on image 71. There is an indeterminate 14 x 8 mm right thyroid nodule on image number 5, not imaged on the previous chest CT.  Lungs/Pleura: There are trace bilateral pleural effusions with mild dependent atelectasis at both lung bases. No suspicious pulmonary nodule or confluent airspace opacity identified.  Upper abdomen: 9 mm hypervascular lesion posteriorly in the right hepatic lobe on image 86 is grossly stable from 2015, probably a hemangioma or other incidental vascular lesion. There is stable probable cyst on image 102. No evidence of adrenal mass.  Musculoskeletal/Chest wall: There is a nondisplaced fracture involving the T1 spinous process. There is underlying irregular lucency within the posterior elements at T1, asymmetric to the  left, and this is suspicious for a pathologic fracture. No other lytic lesions or fractures are seen. The sternum is intact. No apparent epidural tumor.  IMPRESSION: 1. Extensive right internal mammary chain lymphadenopathy consistent with metastatic breast cancer. 2. Lytic metastases involving the posterior elements at T1 with probable nondisplaced pathologic fracture of the spinous process. 3. No other evidence of thoracic metastatic disease. 4. Trace bilateral pleural effusions with associated bibasilar atelectasis. 5. Indeterminate right thyroid nodule, not previously imaged. This could be evaluated with thyroid ultrasound as clinically warranted.   PET ordered, preauth requested, not yet set up. PET is medically necessary due to inadequate imaging of metastatic disease by CT and bone scan as discussed above.    Medications: I have reviewed the patient's current medications. She does not request any additional prescriptions now. Will set up second IV feraheme coordinating with RT in next 1-2 weeks.  DISCUSSION Information above reviewed carefully with assistance of sign language interpreter now. Patient and husband understand that we believe the metastatic cancer originated from the 1999 breast cancer. I have explained that systemic treatment will depend on HER 2 status still pending and findings on PET, either chemotherapy or oral treatment. I have explained that metastatic breast cancer to bone is treatable but not curable.   The fact that she has had such a long time between original diagnosis and the recurrent disease is certainly a favorable feature, as is strong ER positivity. Patient wants reassurance that she has longer than 6 months life expectancy, and I have told her that most likely she will live longer than 6 months. We have discussed rationale for the IV bisphosphonate treatment ongoing.  We have discussed diet and I have encouraged her to proceed with outpatient PT as  planned.  With her vacation July 14-21, hopefully she can have PET shortly prior and see me shortly after.   Not discussed: if HER 2 negative, consider letrozole and palbociclib  Assessment/Plan:  1. Metastatic ER + breast cancer extensively involving left femur and pelvis, possibly T1 spinous process and left posterior 4th rib, right internal mammary nodes, with history of  T1N1 premenopausal right breast carcinoma ER/PR +, HER 2 + diagnosed Nov 1999, post treatment as above. PR minimally + and HER 2 still pending from left femur surgical specimen. Decision re systemic treatment pending HER 2 and PET information. IV bisphosphonate due 4 weeks from 06-29-15, will be coordinated with my next visit after her July vacation. 2.post nailing of impending left femur fracture by Dr Yates on 06-26-15. Sutures to be removed today, outpatient PT pending, radiation in process planned thru 07-26-15.  3.congenital deafness 4.positional vertigo: followed by neurology. 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 7.claustrophobia: required full sedation for the hip MRI. 8. Marked iron deficiency anemia: hemoglobin already much better post first feraheme on 06-29-15. Will give second feraheme coordinating with visit for RT. Feraheme order under sign and held.  All questions answered, patient and husband very comfortable with assistance of sign language interpreter. They know to call if concerns prior to next scheduled appointments; texting to patient's phone is a good way to communicate also. Time spent 50 min including >50% counseling and coordination of care.  Route to Dr [name], whom patient requests as PCP as she was involved in hospital, and cc Drs Kinard and Yates  LIVESAY,LENNIS P, MD   07/14/2015, 11:59  AM    

## 2015-07-12 NOTE — Progress Notes (Signed)
   Weekly Management Note:  Outpatient    ICD-9-CM ICD-10-CM   1. Bony metastasis (HCC) 198.5 C79.51     Current Dose:  3 Gy  Projected Dose: 30 Gy   Narrative:  The patient presents for routine under treatment assessment.  CBCT/MVCT images/Port film x-rays were reviewed.  The chart was checked. [name] has completed 1 fractions to her left femur. She is here with her husband and sign language interpreter. She reports having cramping in her left upper leg today and had pain in her left hip last night which kept her from sleeping. She takes norco as needed. She will see her surgeon today to have her sutures removed and also Dr. Marko Plume. She reports the skin on her leg is intact but her sutures are itchy. Seen with sign language interpreter  Physical Findings:  height is 5\' 7"  (1.702 m) and weight is 132 lb 1.6 oz (59.92 kg). Her oral temperature is 97.4 F (36.3 C). Her blood pressure is 118/82 and her pulse is 77. Her oxygen saturation is 100%.   Wt Readings from Last 3 Encounters:  07/12/15 131 lb 6.4 oz (59.603 kg)  07/12/15 132 lb 1.6 oz (59.92 kg)  07/09/15 131 lb 3.2 oz (59.512 kg)   No acute distress. Uses a walker to ambulate.   Impression:  The patient is tolerating radiotherapy.  Plan:  Continue radiotherapy as planned. I filled out her form for a permanent handicap placard from the [facility].   ________________________________   Eppie Gibson, M.D.  This document serves as a record of services personally performed by Eppie Gibson, MD. It was created on her behalf by Derek Mound, a trained medical scribe. The creation of this record is based on the scribe's personal observations and the provider's statements to them. This document has been checked and approved by the attending provider.

## 2015-07-12 NOTE — Progress Notes (Signed)
Pt here for patient teaching.  Pt given Radiation and You booklet. Pt reports they have not watched the Radiation Therapy Education video and has been given the link to watch at home.  Reviewed areas of pertinence such as fatigue and skin changes . Pt able to give teach back of to pat skin and use unscented/gentle soap,avoid applying anything to skin within 4 hours of treatment. Pt demonstrated understanding and verbalizes understanding of information given and will contact nursing with any questions or concerns.

## 2015-07-12 NOTE — Telephone Encounter (Signed)
appt made and avs printed. Staff message sent to override LL 7/27 schedule at 4 pm for 60 min

## 2015-07-12 NOTE — Progress Notes (Signed)
[name] has completed 1 fractions to her left femur.  She is here with her husband and sign language interpreter.  She reports having cramping in her left upper leg today and had pain in her left hip last night which kept her from sleeping.  She takes norco as needed.  She will see her surgeon today to have her sutures removed and also Dr. Marko Plume.  She reports the skin on her leg is intact but her sutures are itchy.  BP 118/82 mmHg  Pulse 77  Temp(Src) 97.4 F (36.3 C) (Oral)  Ht 5\' 7"  (1.702 m)  Wt 132 lb 1.6 oz (59.92 kg)  BMI 20.68 kg/m2  SpO2 100%   Wt Readings from Last 3 Encounters:  07/12/15 132 lb 1.6 oz (59.92 kg)  07/09/15 131 lb 3.2 oz (59.512 kg)  07/08/15 132 lb 1.6 oz (59.92 kg)

## 2015-07-14 DIAGNOSIS — Z9889 Other specified postprocedural states: Secondary | ICD-10-CM | POA: Insufficient documentation

## 2015-07-14 DIAGNOSIS — C50911 Malignant neoplasm of unspecified site of right female breast: Secondary | ICD-10-CM | POA: Insufficient documentation

## 2015-07-14 DIAGNOSIS — D508 Other iron deficiency anemias: Secondary | ICD-10-CM | POA: Insufficient documentation

## 2015-07-14 DIAGNOSIS — C7951 Secondary malignant neoplasm of bone: Principal | ICD-10-CM

## 2015-07-14 DIAGNOSIS — Z9049 Acquired absence of other specified parts of digestive tract: Secondary | ICD-10-CM | POA: Insufficient documentation

## 2015-07-14 DIAGNOSIS — F40240 Claustrophobia: Secondary | ICD-10-CM | POA: Insufficient documentation

## 2015-07-15 ENCOUNTER — Ambulatory Visit
Admission: RE | Admit: 2015-07-15 | Discharge: 2015-07-15 | Disposition: A | Discharge status: Home / Self Care | Payer: Medicaid Other | Source: Ambulatory Visit | Attending: Radiation Oncology | Admitting: Radiation Oncology

## 2015-07-15 ENCOUNTER — Telehealth: Payer: Self-pay | Admitting: Oncology

## 2015-07-15 DIAGNOSIS — Z510 Encounter for antineoplastic radiation therapy: Secondary | ICD-10-CM | POA: Diagnosis not present

## 2015-07-15 NOTE — Telephone Encounter (Signed)
Medical Oncology  Peer to peer phone discussion with insurance [phone] (case # [id number]) re PET. Information re inadequate visualization with plain films, CT and likely bone scan discussed. PET approved, [id number] (no time interval specified, per that MD "I have never seen less than 4 weeks").  She does not need repeat CT with the PET as CT CAP all recently done.  [place] managed care notified.  L.Livesay MD

## 2015-07-17 ENCOUNTER — Ambulatory Visit: Payer: Medicaid Other | Admitting: Radiation Oncology

## 2015-07-17 ENCOUNTER — Ambulatory Visit
Admission: RE | Admit: 2015-07-17 | Discharge: 2015-07-17 | Disposition: A | Discharge status: Home / Self Care | Payer: Medicaid Other | Source: Ambulatory Visit | Attending: Radiation Oncology | Admitting: Radiation Oncology

## 2015-07-17 ENCOUNTER — Telehealth: Payer: Self-pay

## 2015-07-17 DIAGNOSIS — Z510 Encounter for antineoplastic radiation therapy: Secondary | ICD-10-CM | POA: Diagnosis not present

## 2015-07-17 NOTE — Progress Notes (Addendum)
Notified [name], with assistance of sign language interpreter, that she has a PET scan scheduled for 07/19/15 at 12 pm.  Also advised her not to eat or drink anything after midnight before the scan.  [name] verbalized agreement and understanding.

## 2015-07-17 NOTE — Telephone Encounter (Signed)
S/w Elmo Putt in radiology about pt PET appt 7/7 at 1200. NPO at least 8 hours. Santiago Glad said she would let pt know when she is there at appt today.

## 2015-07-18 ENCOUNTER — Ambulatory Visit
Admission: RE | Admit: 2015-07-18 | Discharge: 2015-07-18 | Disposition: A | Discharge status: Home / Self Care | Payer: Medicaid Other | Source: Ambulatory Visit | Attending: Radiation Oncology | Admitting: Radiation Oncology

## 2015-07-18 ENCOUNTER — Ambulatory Visit: Payer: Medicaid Other

## 2015-07-18 DIAGNOSIS — Z510 Encounter for antineoplastic radiation therapy: Secondary | ICD-10-CM | POA: Diagnosis not present

## 2015-07-19 ENCOUNTER — Encounter: Payer: Self-pay | Admitting: Radiation Oncology

## 2015-07-19 ENCOUNTER — Ambulatory Visit
Admission: RE | Admit: 2015-07-19 | Discharge: 2015-07-19 | Disposition: A | Discharge status: Home / Self Care | Payer: Medicaid Other | Source: Ambulatory Visit | Attending: Radiation Oncology | Admitting: Radiation Oncology

## 2015-07-19 ENCOUNTER — Encounter: Payer: Self-pay | Admitting: *Deleted

## 2015-07-19 ENCOUNTER — Encounter (HOSPITAL_COMMUNITY)
Admission: RE | Admit: 2015-07-19 | Discharge: 2015-07-19 | Disposition: A | Discharge status: Home / Self Care | Payer: Medicaid Other | Source: Ambulatory Visit | Attending: Oncology | Admitting: Oncology

## 2015-07-19 DIAGNOSIS — C50919 Malignant neoplasm of unspecified site of unspecified female breast: Secondary | ICD-10-CM | POA: Diagnosis present

## 2015-07-19 DIAGNOSIS — C7951 Secondary malignant neoplasm of bone: Secondary | ICD-10-CM | POA: Diagnosis present

## 2015-07-19 DIAGNOSIS — C799 Secondary malignant neoplasm of unspecified site: Secondary | ICD-10-CM | POA: Diagnosis present

## 2015-07-19 DIAGNOSIS — Z510 Encounter for antineoplastic radiation therapy: Secondary | ICD-10-CM | POA: Diagnosis not present

## 2015-07-19 LAB — GLUCOSE, CAPILLARY: Glucose-Capillary: 101 mg/dL — ABNORMAL HIGH (ref 65–99)

## 2015-07-19 MED ORDER — FLUDEOXYGLUCOSE F - 18 (FDG) INJECTION
6.5400 | Freq: Once | INTRAVENOUS | Status: AC | PRN
  Administered 2015-07-19: 6.54 via INTRAVENOUS

## 2015-07-19 MED ORDER — TECHNETIUM TC 99M MEDRONATE IV KIT: 25.0000 | PACK | Freq: Once | INTRAVENOUS | Status: DC | PRN

## 2015-07-19 NOTE — Progress Notes (Signed)
[place] Psychosocial Distress Screening Clinical Social Work  Clinical Social Work was referred by distress screening protocol.  The patient scored a 5 on the Psychosocial Distress Thermometer which indicates moderate distress. Clinical Social Worker met with patient and interpreter after radiation oncology to assess for distress and other psychosocial needs.  [name] stated she is coping well with diagnosis, experiences occasional anxiety.  CSW validated patients feeling of anxiety during cancer process and provided brief support.  The patient shared her main support comes from her spouse.  She has two children and four grandchildren.  She reported some financial concerns.  CSW informed patient of grant and contacted radiation financial advocates to follow up with patient next week.  CSW strongly encouraged patient to call with other questions or concerns.  ONCBCN DISTRESS SCREENING 07/08/2015  Screening Type Initial Screening  Distress experienced in past week (1-10) 5  Emotional problem type Nervousness/Anxiety;Adjusting to appearance changes    [name], MSW, LCSW, OSW-C Clinical Social Worker [facility] [phone]

## 2015-07-19 NOTE — Progress Notes (Signed)
Financial Counseling--called patient at [phone] left her a message to contact me--need to see if she would qualify for grants

## 2015-07-22 ENCOUNTER — Telehealth: Payer: Self-pay | Admitting: Internal Medicine

## 2015-07-22 ENCOUNTER — Ambulatory Visit: Payer: Medicaid Other | Attending: Oncology | Admitting: Physical Therapy

## 2015-07-22 ENCOUNTER — Telehealth: Payer: Self-pay | Admitting: Oncology

## 2015-07-22 ENCOUNTER — Ambulatory Visit (HOSPITAL_BASED_OUTPATIENT_CLINIC_OR_DEPARTMENT_OTHER): Payer: Medicaid Other

## 2015-07-22 ENCOUNTER — Ambulatory Visit
Admission: RE | Admit: 2015-07-22 | Discharge: 2015-07-22 | Disposition: A | Discharge status: Home / Self Care | Payer: Medicaid Other | Source: Ambulatory Visit | Attending: Radiation Oncology | Admitting: Radiation Oncology

## 2015-07-22 ENCOUNTER — Other Ambulatory Visit: Payer: Self-pay | Admitting: Oncology

## 2015-07-22 ENCOUNTER — Encounter: Payer: Self-pay | Admitting: Physical Therapy

## 2015-07-22 VITALS — BP 114/66 | HR 73 | Temp 98.0°F | Resp 16

## 2015-07-22 DIAGNOSIS — M25552 Pain in left hip: Secondary | ICD-10-CM | POA: Insufficient documentation

## 2015-07-22 DIAGNOSIS — D509 Iron deficiency anemia, unspecified: Secondary | ICD-10-CM | POA: Diagnosis not present

## 2015-07-22 DIAGNOSIS — M25652 Stiffness of left hip, not elsewhere classified: Secondary | ICD-10-CM | POA: Diagnosis not present

## 2015-07-22 DIAGNOSIS — R2689 Other abnormalities of gait and mobility: Secondary | ICD-10-CM | POA: Diagnosis present

## 2015-07-22 DIAGNOSIS — D508 Other iron deficiency anemias: Secondary | ICD-10-CM

## 2015-07-22 DIAGNOSIS — Z510 Encounter for antineoplastic radiation therapy: Secondary | ICD-10-CM | POA: Diagnosis not present

## 2015-07-22 MED ORDER — SODIUM CHLORIDE 0.9 % IV SOLN
510.0000 mg | Freq: Once | INTRAVENOUS | Status: AC
  Administered 2015-07-22: 510 mg via INTRAVENOUS
  Filled 2015-07-22: qty 17

## 2015-07-22 NOTE — Therapy (Addendum)
[place], [place], [address] Phone: [phone]   Fax:  [phone]  Physical Therapy Evaluation  Patient Details  Name: [name] MRN: [id number] Date of Birth: 07/16/1965 Referring Provider: Marko Plume  Encounter Date: 07/22/2015      PT End of Session - 07/22/15 1354    Visit Number 1   Number of Visits 4   Date for PT Re-Evaluation 08/21/15   Authorization Type Medicaid   PT Start Time 1304   PT Stop Time 1345   PT Time Calculation (min) 41 min   Activity Tolerance Patient tolerated treatment well   Behavior During Therapy [facility] for tasks assessed/performed      Past Medical History  Diagnosis Date  . Congenital deafness   . CIN I (cervical intraepithelial neoplasia I) 2003    by colpo  . S/P bilateral oophorectomy   . Deaf   . Cancer ([place]) 1999    breast-s/p mastectomy, chemo, rad  . Breast cancer ([place])   . Abnormal Pap smear   . Depression   . Anxiety     Past Surgical History  Procedure Laterality Date  . Ovarian cyst removal    . Tubal ligation    . Abdominal hysterectomy    . Mastectomy      right breast  . Mastectomy    . Radiology with anesthesia Left 06/25/2015    Procedure: MRI OF LEFT HIP WITH OR WITHOUT CONTRAST;  Surgeon: Medication Radiologist, MD;  Location: [place];  Service: Radiology;  Laterality: Left;  DR. MCINTYRE/MRI  . Intramedullary (im) nail intertrochanteric Left 06/26/2015    Procedure: LEFT BIOMET LONG AFFIXS NAIL;  Surgeon: [name], MD;  Location: [place];  Service: Orthopedics;  Laterality: Left;    There were no vitals filed for this visit.       Subjective Assessment - 07/22/15 1310    Subjective It is hard to bend over and put on socks. It is hard to roll over in the bed. It is hard to walk. I can not walk normally. I get tired easily. Dr. sent me here to get help walking and going up and down steps.    Patient is accompained by:  Interpreter;Family member   Pertinent History Patient developed left hip pain spring 2017, with plain xrays 06-06-15 negative, CT AP with contrast 06-10-15 negative, and MRI 06-25-15 " Widespread osseous metastatic disease throughout the visualized pelvis. Index right posterior acetabular wall lesion 2.3  by 1.1 cm on image 9 series 5.There is particularly extensive tumor in the left proximal femoral neck, intertrochanteric region, and especially the proximal 15 cm of the shaft in which the medullary space it is replaced by tumor. There is periostitis and potentially early periosteal spread of tumor specially anteriorly in the proximal femoral metaphysis where there is moth-eaten endosteal scalloping and probable mild cortical breakthrough. Low-level edema and adjacent anterior musculature in this vicinity. My opinion is that structural integrity of the proximal femur is likely compromised, with increased risk offracture.There is considerable tumor in the left anterior acetabular wall. Multiple foci of tumor present in the right proximal femur, the largest measuring 1.6 cm in the right femoral shaft on image 16/5. These osseous metastatic lesions are surprisingly difficult to visualize on the CT scan.". She was admitted [facility] from the MRI due to impending left femoral fracture, with nail stabilization done by Dr [name] on 06-26-15. 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 I have spoken directly with pathology now, HER 2 again requested. Note patient's breast cancer in 1999 was ER PR and HER 2 positive. CT chest 06-28-15 did show enlarged right internal mammary nodes, trace bilateral pleural effusions, nondisplaced fracture spinous process T1 suspicious for pathologic fracture, indeterminate right thyroid nodule; CT AP had been done 06-10-15 with ED visit for the left hip pain. Patient has begun radiation by Dr Sondra Come to left  femur area, planned thru 07-26-15. She has follow up with Dr [name] also 07-12-15 for suture removal. Patient had 1.4 cm poorly differentiated right breast carcinoma diagnosed in Nov 1999 at age 35, 1/7 axillary nodes involved, ER/PR and HER 2 positive. She had mastectomy with the 7 axillary node evaluation, 4 cycles of adriamycin/ cytoxan followed by taxotere, then five years of tamoxifen thru June 2005 (no herceptin in 1999). She had right TRAM reconstruction. She had bilateral salpingoophorectomy in May 2005. She was briefly on aromatase inhibitor after tamoxifen, but discontinued this herse   Patient Stated Goals to walk normally   Currently in Pain? Yes   Pain Score 4    Pain Location Hip  and buttocks   Pain Orientation Left;Lateral   Pain Descriptors / Indicators Throbbing   Pain Type Surgical pain;Chronic pain   Pain Onset More than a month ago   Pain Frequency Intermittent   Aggravating Factors  sitting for a long time   Pain Relieving Factors get up and walk, changing positions   Effect of Pain on Daily Activities yesterday was the first time she could stand and cook, she has a hard time completing daily activities due to pain and fatigue            [facility] PT Assessment - 07/22/15 0001    Assessment   Medical Diagnosis right sided breast cancer   Referring Provider [place]   Onset Date/Surgical Date 10/12/97   Hand Dominance Right   Prior Therapy none   Precautions   Precautions Other (comment)  at risk of developing radiation   Restrictions   Weight Bearing Restrictions No   Balance Screen   Has the patient fallen in the past 6 months No   Has the patient had a decrease in activity level because of a fear of falling?  Yes   Is the patient reluctant to leave their home because of a fear of falling?  No   Home Ecologist residence   Living Arrangements Spouse/significant other   Available Help at Discharge Family   Type of Cool to enter   Entrance Stairs-Number of Steps 4   Entrance Stairs-Rails Can reach both   [place] One level   [place] - 2 wheels;Shower seat  pt would benefit from toilet seat riser   Prior Function   Level of Independence Independent   Vocation Unemployed   Leisure pt states she walked a lot   Cognition   Overall Cognitive Status Within Functional Limits for tasks assessed   AROM   Right Hip Flexion 90   Right Hip External Rotation  28   Right Hip Internal Rotation  20   Right Hip ABduction 31   Left Hip Flexion 62   Left Hip External Rotation  10   Left Hip Internal Rotation  18   Left Hip ABduction 10   Ambulation/Gait   Ambulation/Gait Yes  Ambulation/Gait Assistance 6: Modified independent (Device/Increase time)   Ambulation Distance (Feet) 150 Feet  unable to walk around grocery store, uses cart   Gait Pattern Decreased step length - left;Decreased stance time - left;Decreased hip/knee flexion - left;Antalgic   Ambulation Surface Level           LYMPHEDEMA/ONCOLOGY QUESTIONNAIRE - 07/22/15 1329    Type   Cancer Type right breast cancer   Surgeries   Mastectomy Date 10/12/97   Tram Date 10/12/97   Other Surgery Date 06/26/15  left femur pinning   Number Lymph Nodes Removed 7   Treatment   Active Chemotherapy Treatment No   Past Chemotherapy Treatment Yes   Active Radiation Treatment Yes   Body Site --  left hip   Current Hormone Treatment No   Drug Name tamoxifen   Past Hormone Therapy Yes   What other symptoms do you have   Are you Having Heaviness or Tightness Yes   Are you having Pain Yes   Are you having pitting edema No   Is it Hard or Difficult finding clothes that fit No   Do you have infections No   Is there Decreased scar mobility Yes                                Long Term Clinic Goals - 07/22/15 1410    CC Long Term Goal  #1   Title Pt will be independent in a home exercise  program for stretching and strengthening of L hip   Time 4   Period Weeks   Status New            Plan - 07/22/15 1401    Clinical Impression Statement Pt presents with left hip pain following metastatic cancer in left hip and left femur pinning to stabilize bone. Pt has difficulty walking long distances and is unable to get through the grocery store without an electric scooter. She has pain when sitting for long periods and lying in bed. She has decreased range of motion of left hip in all directions.    Rehab Potential Good   Clinical Impairments Affecting Rehab Potential none   PT Frequency 1x / week   PT Duration 4 weeks   PT Treatment/Interventions Therapeutic exercise;Therapeutic activities;Scar mobilization;Passive range of motion;ADLs/Self Care Home Management;Patient/family education;Gait training;Stair training   PT Next Visit Plan begin instructing on HEP, PROM to left hip   Consulted and Agree with Plan of Care Patient      Patient will benefit from skilled therapeutic intervention in order to improve the following deficits and impairments:  Decreased range of motion, Decreased balance, Decreased mobility, Difficulty walking, Pain, Decreased strength, Decreased activity tolerance, Decreased scar mobility, Decreased endurance  Visit Diagnosis: Stiffness of left hip, not elsewhere classified - Plan: PT plan of care cert/re-cert  Pain in left hip - Plan: PT plan of care cert/re-cert  Other abnormalities of gait and mobility - Plan: PT plan of care cert/re-cert     Problem List Patient Active Problem List   Diagnosis Date Noted  . [name] ([place]) 07/14/2015  . Other iron deficiency anemias 07/14/2015  . Claustrophobia 07/14/2015  . Status post laparoscopic cholecystectomy 07/14/2015  . S/P TRAM (transverse rectus abdominis muscle) flap breast reconstruction 07/14/2015  . Thyroid nodule 07/09/2015  . Constipation due to pain medication    . Metastasis from breast cancer ([place])   .  Left hip pain   . Metastatic bone cancer ([place])   . Depression   . Generalized anxiety disorder   . Esophageal reflux   . Bony metastasis ([place]) 06/25/2015  . Breast cancer ([place]) 06/25/2015  . Genetic testing 04/04/2015  . Congenital deafness 02/26/2015  . Noncompliance with therapeutic plan 02/26/2015  . Hx of BSO (bilateral salpingo-oophorectomy) 02/26/2015  . Premature surgical menopause 02/26/2015  . Benign paroxysmal positional vertigo 07/24/2014  . Migraine 07/24/2014  . RLQ abdominal pain 09/30/2013  . Dysuria 09/30/2013  . Hx of breast cancer 04/28/2013  . Colon cancer screening 04/28/2013  . Migraine without aura, with intractable migraine, so stated, without mention of status migrainosus 06/22/2012  . Dizziness 05/14/2011  . Pharyngitis, acute 09/04/2010  . BACK STRAIN, LUMBAR 01/21/2010  . DE QUERVAIN'S TENOSYNOVITIS, LEFT WRIST 10/07/2009  . EMESIS 02/05/2009  . ABDOMINAL PAIN, GENERALIZED 02/05/2009  . DIZZINESS, CHRONIC 01/25/2007  . INSOMNIA 01/25/2007  . Hearing loss 03/11/2006  . CERVICAL DYSPLASIA 03/11/2006  . MENSTRUATION, PAINFUL 03/11/2006    [name] 07/22/2015, 2:12 PM  [place], [place], [address] Phone: [phone]   Fax:  [phone]  Name: [name] MRN: [id number] Date of Birth: 03-Nov-1965   Allyson Sabal, PT 07/22/2015 2:12 PM  PHYSICAL THERAPY DISCHARGE SUMMARY  Visits from Start of Care: 1  Current functional level related to goals / functional outcomes: See above. Pt did not have insurance coverage for visits so did not return.    Remaining deficits: See above   Education / Equipment: See above Plan: Patient agrees to discharge.  Patient goals were not met. Patient is being discharged due to financial reasons.  ?????     Allyson Sabal, PT 12/02/15 11:47 AM

## 2015-07-22 NOTE — Telephone Encounter (Signed)
cld & spoke to pt and gave pt time & date of appt °

## 2015-07-22 NOTE — Patient Instructions (Addendum)
Please come to the [facility] on Friday, June 14th @ 11:30 am for your lab appointment.  You will see Dr. Marko Plume at 12:00 pm following your lab appointment.  Please call the [facility] if you have any questions.  Ferumoxytol injection What is this medicine? FERUMOXYTOL is an iron complex. Iron is used to make healthy red blood cells, which carry oxygen and nutrients throughout the body. This medicine is used to treat iron deficiency anemia in people with chronic kidney disease. This medicine may be used for other purposes; ask your health care provider or pharmacist if you have questions. What should I tell my health care provider before I take this medicine? They need to know if you have any of these conditions: -anemia not caused by low iron levels -high levels of iron in the blood -magnetic resonance imaging (MRI) test scheduled -an unusual or allergic reaction to iron, other medicines, foods, dyes, or preservatives -pregnant or trying to get pregnant -breast-feeding How should I use this medicine? This medicine is for injection into a vein. It is given by a health care professional in a hospital or clinic setting. Talk to your pediatrician regarding the use of this medicine in children. Special care may be needed. Overdosage: If you think you have taken too much of this medicine contact a poison control center or emergency room at once. NOTE: This medicine is only for you. Do not share this medicine with others. What if I miss a dose? It is important not to miss your dose. Call your doctor or health care professional if you are unable to keep an appointment. What may interact with this medicine? This medicine may interact with the following medications: -other iron products This list may not describe all possible interactions. Give your health care provider a list of all the medicines, herbs, non-prescription drugs, or dietary supplements you use. Also tell them if you smoke, drink  alcohol, or use illegal drugs. Some items may interact with your medicine. What should I watch for while using this medicine? Visit your doctor or healthcare professional regularly. Tell your doctor or healthcare professional if your symptoms do not start to get better or if they get worse. You may need blood work done while you are taking this medicine. You may need to follow a special diet. Talk to your doctor. Foods that contain iron include: whole grains/cereals, dried fruits, beans, or peas, leafy green vegetables, and organ meats (liver, kidney). What side effects may I notice from receiving this medicine? Side effects that you should report to your doctor or health care professional as soon as possible: -allergic reactions like skin rash, itching or hives, swelling of the face, lips, or tongue -breathing problems -changes in blood pressure -feeling faint or lightheaded, falls -fever or chills -flushing, sweating, or hot feelings -swelling of the ankles or feet Side effects that usually do not require medical attention (Report these to your doctor or health care professional if they continue or are bothersome.): -diarrhea -headache -nausea, vomiting -stomach pain This list may not describe all possible side effects. Call your doctor for medical advice about side effects. You may report side effects to FDA at 1-800-FDA-1088. Where should I keep my medicine? This drug is given in a hospital or clinic and will not be stored at home. NOTE: This sheet is a summary. It may not cover all possible information. If you have questions about this medicine, talk to your doctor, pharmacist, or health care provider.  2016, Elsevier/Gold Standard. (2011-08-14 15:23:36)

## 2015-07-23 ENCOUNTER — Telehealth: Payer: Self-pay | Admitting: Oncology

## 2015-07-23 ENCOUNTER — Ambulatory Visit
Admission: RE | Admit: 2015-07-23 | Discharge: 2015-07-23 | Disposition: A | Discharge status: Home / Self Care | Payer: Medicaid Other | Source: Ambulatory Visit | Attending: Radiation Oncology | Admitting: Radiation Oncology

## 2015-07-23 ENCOUNTER — Encounter: Payer: Self-pay | Admitting: Radiation Oncology

## 2015-07-23 VITALS — BP 114/68 | HR 81 | Temp 97.8°F | Ht 67.0 in | Wt 133.0 lb

## 2015-07-23 DIAGNOSIS — C7951 Secondary malignant neoplasm of bone: Principal | ICD-10-CM

## 2015-07-23 DIAGNOSIS — C50911 Malignant neoplasm of unspecified site of right female breast: Secondary | ICD-10-CM

## 2015-07-23 DIAGNOSIS — Z510 Encounter for antineoplastic radiation therapy: Secondary | ICD-10-CM | POA: Diagnosis not present

## 2015-07-23 NOTE — Telephone Encounter (Signed)
Left a message for Barbaraann Share, RN.  Asked if Alex[facility] could be called to discuss upcoming appointments to see Dr. Marko Plume and for chemotherapy.

## 2015-07-23 NOTE — Progress Notes (Signed)
[name] has completed 7 fractions to her left femur.  She denies pain but reports having soreness in her left leg at night.  She also reports having tightness in her left leg at night.  She reports she is going to physical therapy and is walking more.  She reports her energy level is improving.  The skin on her left upper femur is pink.  She is wondering about the results of her pet scan from Friday.  BP 114/68 mmHg  Pulse 81  Temp(Src) 97.8 F (36.6 C) (Oral)  Ht 5\' 7"  (1.702 m)  Wt 133 lb (60.328 kg)  BMI 20.83 kg/m2  SpO2 100%

## 2015-07-23 NOTE — Progress Notes (Signed)
  Radiation Oncology         [phone] ________________________________  Name: [name] MRN: [id number]  Date: 07/23/2015  DOB: 07-06-1965     Weekly Radiation Therapy Management    ICD-9-CM ICD-10-CM   1. [name], right ([facility]) 174.9 C50.911    198.5 C79.51      Current Dose: 21 Gy     Planned Dose:  30 Gy  Narrative . . . . . . . . The patient presents for routine under treatment assessment.                                   Our visit was facilitated by a sign language interpreter. [name] has completed 7 fractions to her left femur. She denies pain but reports having soreness in her left leg at night. She also reports having tightness in her left leg currently. She reports she is going to physical therapy and is walking more. She reports her energy level is improving. She reports soreness in her right chest and shoulder. The skin on her left upper femur is pink. She is wondering about the results of her PET scan from Friday, 7/7.                                  Set-up films were reviewed.                                 The chart was checked. Physical Findings. . .  height is 5\' 7"  (1.702 m) and weight is 133 lb (60.328 kg). Her oral temperature is 97.8 F (36.6 C). Her blood pressure is 114/68 and her pulse is 81. Her oxygen saturation is 100%. . Weight essentially stable.  No significant changes. Lungs are clear to auscultation bilaterally. Heart has regular rate and rhythm. No palpable cervical, supraclavicular, or axillary adenopathy. Abdomen soft, non-tender, normal bowel sounds. Mildly tender in the right peristernal area with palpation. Impression . . . . . . . The patient is tolerating radiation. Plan . . . . . . . . . . . . Continue treatment as planned. She is scheduled to complete treatment this Friday and will return for follow up in 1 month. I spoke with the patient about PET scan results from 07/19/2015. She is scheduled for a visit with  Dr. Marko Plume this Friday, 7/14.   ________________________________   Blair Promise, PhD, MD   This document serves as a record of services personally performed by Gery Pray, MD. It was created on his behalf by Arlyce Harman, a trained medical scribe. The creation of this record is based on the scribe's personal observations and the provider's statements to them. This document has been checked and approved by the attending provider.

## 2015-07-24 ENCOUNTER — Other Ambulatory Visit: Payer: Self-pay | Admitting: Oncology

## 2015-07-24 ENCOUNTER — Ambulatory Visit
Admission: RE | Admit: 2015-07-24 | Discharge: 2015-07-24 | Disposition: A | Discharge status: Home / Self Care | Payer: Medicaid Other | Source: Ambulatory Visit | Attending: Radiation Oncology | Admitting: Radiation Oncology

## 2015-07-24 DIAGNOSIS — Z510 Encounter for antineoplastic radiation therapy: Secondary | ICD-10-CM | POA: Diagnosis not present

## 2015-07-25 ENCOUNTER — Ambulatory Visit: Payer: Medicaid Other

## 2015-07-25 ENCOUNTER — Other Ambulatory Visit: Payer: Self-pay | Admitting: Oncology

## 2015-07-25 ENCOUNTER — Ambulatory Visit
Admission: RE | Admit: 2015-07-25 | Discharge: 2015-07-25 | Disposition: A | Discharge status: Home / Self Care | Payer: Medicaid Other | Source: Ambulatory Visit | Attending: Radiation Oncology | Admitting: Radiation Oncology

## 2015-07-25 ENCOUNTER — Telehealth: Payer: Self-pay | Admitting: Oncology

## 2015-07-25 ENCOUNTER — Telehealth: Payer: Self-pay | Admitting: *Deleted

## 2015-07-25 DIAGNOSIS — Z510 Encounter for antineoplastic radiation therapy: Secondary | ICD-10-CM | POA: Diagnosis not present

## 2015-07-25 NOTE — Telephone Encounter (Signed)
per pof to sch pt appt-MW added appt 7/14@4 -pt aware-interperter aware

## 2015-07-25 NOTE — Telephone Encounter (Signed)
Per staff phone call and POF I have schedueld appts. Scheduler advised of appts.  JMW  

## 2015-07-26 ENCOUNTER — Ambulatory Visit (HOSPITAL_BASED_OUTPATIENT_CLINIC_OR_DEPARTMENT_OTHER): Payer: Medicaid Other | Admitting: Oncology

## 2015-07-26 ENCOUNTER — Ambulatory Visit
Admission: RE | Admit: 2015-07-26 | Discharge: 2015-07-26 | Disposition: A | Discharge status: Home / Self Care | Payer: Medicaid Other | Source: Ambulatory Visit | Attending: Radiation Oncology | Admitting: Radiation Oncology

## 2015-07-26 ENCOUNTER — Encounter: Payer: Self-pay | Admitting: Oncology

## 2015-07-26 ENCOUNTER — Other Ambulatory Visit: Payer: Self-pay | Admitting: Oncology

## 2015-07-26 ENCOUNTER — Other Ambulatory Visit (HOSPITAL_BASED_OUTPATIENT_CLINIC_OR_DEPARTMENT_OTHER): Payer: Medicaid Other

## 2015-07-26 ENCOUNTER — Telehealth: Payer: Self-pay | Admitting: Oncology

## 2015-07-26 ENCOUNTER — Ambulatory Visit: Payer: Medicaid Other

## 2015-07-26 ENCOUNTER — Encounter: Payer: Self-pay | Admitting: Radiation Oncology

## 2015-07-26 VITALS — BP 114/84 | HR 83 | Temp 97.8°F | Resp 18 | Ht 67.0 in | Wt 132.8 lb

## 2015-07-26 DIAGNOSIS — H9193 Unspecified hearing loss, bilateral: Secondary | ICD-10-CM

## 2015-07-26 DIAGNOSIS — D508 Other iron deficiency anemias: Secondary | ICD-10-CM

## 2015-07-26 DIAGNOSIS — Z510 Encounter for antineoplastic radiation therapy: Secondary | ICD-10-CM | POA: Diagnosis not present

## 2015-07-26 DIAGNOSIS — C50911 Malignant neoplasm of unspecified site of right female breast: Secondary | ICD-10-CM

## 2015-07-26 DIAGNOSIS — Z170 Estrogen receptor positive status [ER+]: Secondary | ICD-10-CM

## 2015-07-26 DIAGNOSIS — C50919 Malignant neoplasm of unspecified site of unspecified female breast: Secondary | ICD-10-CM

## 2015-07-26 DIAGNOSIS — C7951 Secondary malignant neoplasm of bone: Secondary | ICD-10-CM | POA: Diagnosis not present

## 2015-07-26 LAB — COMPREHENSIVE METABOLIC PANEL
ALBUMIN: 3.6 g/dL (ref 3.5–5.0)
ALT: 9 U/L (ref 0–55)
ANION GAP: 12 meq/L — AB (ref 3–11)
AST: 12 U/L (ref 5–34)
Alkaline Phosphatase: 108 U/L (ref 40–150)
BILIRUBIN TOTAL: 0.95 mg/dL (ref 0.20–1.20)
BUN: 10 mg/dL (ref 7.0–26.0)
CALCIUM: 9.5 mg/dL (ref 8.4–10.4)
CO2: 26 mEq/L (ref 22–29)
CREATININE: 0.8 mg/dL (ref 0.6–1.1)
Chloride: 106 mEq/L (ref 98–109)
EGFR: 85 mL/min/{1.73_m2} — ABNORMAL LOW (ref >90)
Glucose: 107 mg/dl (ref 70–140)
Potassium: 4 mEq/L (ref 3.5–5.1)
Sodium: 143 mEq/L (ref 136–145)
TOTAL PROTEIN: 7.5 g/dL (ref 6.4–8.3)

## 2015-07-26 LAB — CBC WITH DIFFERENTIAL/PLATELET
BASO%: 0.7 % (ref 0.0–2.0)
Basophils Absolute: 0 10*3/uL (ref 0.0–0.1)
EOS%: 3.1 % (ref 0.0–7.0)
Eosinophils Absolute: 0.1 10*3/uL (ref 0.0–0.5)
HEMATOCRIT: 33.5 % — AB (ref 34.8–46.6)
HEMOGLOBIN: 11.2 g/dL — AB (ref 11.6–15.9)
LYMPH#: 1.1 10*3/uL (ref 0.9–3.3)
LYMPH%: 23.7 % (ref 14.0–49.7)
MCH: 29.7 pg (ref 25.1–34.0)
MCHC: 33.5 g/dL (ref 31.5–36.0)
MCV: 88.7 fL (ref 79.5–101.0)
MONO#: 0.5 10*3/uL (ref 0.1–0.9)
MONO%: 11 % (ref 0.0–14.0)
NEUT%: 61.5 % (ref 38.4–76.8)
NEUTROS ABS: 2.7 10*3/uL (ref 1.5–6.5)
PLATELETS: 279 10*3/uL (ref 145–400)
RBC: 3.77 10*6/uL (ref 3.70–5.45)
RDW: 14.4 % (ref 11.2–14.5)
WBC: 4.4 10*3/uL (ref 3.9–10.3)

## 2015-07-26 LAB — MAGNESIUM: MAGNESIUM: 2.1 mg/dL (ref 1.5–2.5)

## 2015-07-26 MED ORDER — ONDANSETRON HCL 8 MG PO TABS: 8.0000 mg | ORAL_TABLET | Freq: Three times a day (TID) | ORAL | Status: DC | PRN

## 2015-07-26 MED ORDER — LIDOCAINE-PRILOCAINE 2.5-2.5 % EX CREA: 1.0000 "application " | TOPICAL_CREAM | CUTANEOUS | Status: DC | PRN

## 2015-07-26 MED ORDER — DEXAMETHASONE 4 MG PO TABS: ORAL_TABLET | ORAL | Status: DC

## 2015-07-26 MED ORDER — LORAZEPAM 0.5 MG PO TABS: ORAL_TABLET | ORAL | Status: DC

## 2015-07-26 MED ORDER — HYDROCODONE-ACETAMINOPHEN 5-325 MG PO TABS: ORAL_TABLET | ORAL | Status: DC

## 2015-07-26 NOTE — Patient Instructions (Addendum)
Please try taking pain medicine hydrocodone 5-325 one tablet every 6 hrs.  If needed you can increase this to 2 tablets every 6 hrs.   You may notice some constipation from the pain medicine. You should start colace (docusate sodium, over the counter stool softener) once or twice daily. If bowels do not move well every day, you can add senokot 1 or 2 tablets daily to the colace.   Echocardiogram (ultrasound of heart) at [facility] 7-24. Check in at [place] [id number] for 11:00 echo. Chemo teaching class Mon 7-24 at 12:00 Lab at [facility] 7-24 before or after chemo teaching - scheduler will tell you when  Portacath to be placed by interventional radiology on Tues July 25 . Check in at radiology 1130. Do not eat or drink after 7AM that day before procedure.  Call if increased pain or weakness   [phone]

## 2015-07-26 NOTE — Telephone Encounter (Signed)
appt made and avs printed °

## 2015-07-26 NOTE — Progress Notes (Signed)
OFFICE PROGRESS NOTE   July 26, 2015   [place], Rodell Perna, Ryan Grunz/ Katy [name] (Laura), C.[place] Tamala Julian, Rosalin Hawking (neurology)   Prior to visit I have reviewed case with my partner in breast oncology.  INTERVAL HISTORY:  Patient is seen, together with husband, close friend and sign language interpreter, to discuss PET scan and completed pathology information re recently diagnosed metastatic breast cancer, and for treatment planning. Patient, husband and friend all deaf and communicate by signing.   Patient will complete radiation to left femur today (07-26-15); Dr Sondra Come is aware of PET information.  She had IV bisphosphonate on 06-29-15, will be given again week of 7-24 as she requests to be out of town next week.  She had feraheme on 6-17 and 07-22-15 for documented iron deficiency.  She had PAC with adjuvant chemotherapy. Peripheral IV access is limited to LUE due to previous right axillary node evaluation.   Patient has tolerated radiation to left femur well. She reports "stiffness" in LLE, but no significant pain there. She reports soreness right anterior chest at upper sternal border, right scapula, and shins bilaterally. She is generally achy otherwise, but no specific back pain, no LE weakness, bowels and bladder ok.  She has taken just occasional hydrocodone APAP 5-325, one tablet at a time, last 2 days ago. She is more comfortable with with pain medication, but was afraid this made her drowsy and has concerns about addiction - discussed. She has used occasional NSAID, somewhat helpful. She has had difficulty sleeping due to pain, awake until at least 0500 today. Appetite is ok. No SOB or cough. HA this AM resolved with NSAID, no recurring HA and no other neurologic symptoms. No problems with feraheme, peripheral IV access with one attempt.  Remainder of 10 point Review of Systems negative.   No central catheter Genetics testing negative by Breast  Ovarian panel 03-2015 [facility] 03[facility]15[facility]15 heterogeneously dense tissue otherwise negative. CA 2729 on 06-26-15: 72.5  Patient plans to go to a [employer] [place] in [facility] July 14-21, leaving this afternoon. She again requests all medical visits etc be scheduled to allow this trip.     ONCOLOGIC HISTORY Patient had 1.4 cm poorly differentiated right breast carcinoma diagnosed in Nov 1999 at age 50, 1/7 axillary nodes involved, ER/PR and HER 2 positive. She had mastectomy with the 7 axillary node evaluation, 4 cycles of adriamycin/ cytoxan followed by taxotere, then five years of tamoxifen thru June 2005 (no herceptin in 1999). She did not have local radiation. She had right TRAM reconstruction. She had bilateral salpingoophorectomy in May 2005. She was briefly on aromatase inhibitor after tamoxifen, but discontinued this herself due to poor tolerance. Most recent left mammogram was done as screening on 04-15-2012 with heterogeneously dense tissue and questioned asymmetry, followed by diagnostic left mamogram on 04-28-12 which had no findings of concern. 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 She had nailing of impending left femur fracture by Dr [name] on 06-26-15. Pathology [phone] from 06-26-15 showed metastatic adenocarcinoma , strongly ER positive 95%,  PR positive 2%, HER 2 positive 3+. Plain xrays suggested T1 and left 4th rib involvement, tho these also did not show on bone scan 07-01-15; like unremarkable CT, bone scan showed only activity in left femur area of surgery. PET 07-19-15 showed extensive bone involvement axial and proximal appendicular skeleton, "severe multifocal  osseous metastatic disease, much greater than anticipated based on prior imaging", involved nodes right subpectoral and right internal mammary, and no distant extraosseous mets.     Objective:  Vital signs in last 24 hours:  BP 114/84 mmHg  Pulse 83  Temp(Src) 97.8 F (36.6 C) (Oral)  Resp 18  Ht 5' 7"  (1.702 m)  Wt 132 lb 12.8 oz (60.238 kg)  BMI 20.79 kg/m2  SpO2 100% Weight stable Alert, oriented and appropriate. Ambulatory with slight difficulty favoring left leg.   HEENT:PERRL, sclerae not icteric. Oral mucosa moist. Lymphatics:no supraclavicular adenopathy Resp: clear to auscultation bilaterally  Cardio: regular rate and rhythm. No gallop. GI: soft, nontender Musculoskeletal/ Extremities:LE/ UE without pitting edema, cords. Anterior chest wall not remarkable. Neuro: deafness otherwise nonfocal Skin without rash, ecchymosis, petechiae. No problems at recent IV access sites LUE.  Breasts: right TRAM Portacath scar well healed left anterior chest.  Lab Results:       Results for orders placed or performed in visit on 07/26/15  CBC with Differential  Result Value Ref Range   WBC 4.4 3.9 - 10.3 10e3/uL   NEUT# 2.7 1.5 - 6.5 10e3/uL   HGB 11.2 (L) 11.6 - 15.9 g/dL   HCT 33.5 (L) 34.8 - 46.6 %   Platelets 279 145 - 400 10e3/uL   MCV 88.7 79.5 - 101.0 fL   MCH 29.7 25.1 - 34.0 pg   MCHC 33.5 31.5 - 36.0 g/dL   RBC 3.77 3.70 - 5.45 10e6/uL   RDW 14.4 11.2 - 14.5 %   lymph# 1.1 0.9 - 3.3 10e3/uL   MONO# 0.5 0.1 - 0.9 10e3/uL   Eosinophils Absolute 0.1 0.0 - 0.5 10e3/uL   Basophils Absolute 0.0 0.0 - 0.1 10e3/uL   NEUT% 61.5 38.4 - 76.8 %   LYMPH% 23.7 14.0 - 49.7 %   MONO% 11.0 0.0 - 14.0 %   EOS% 3.1 0.0 - 7.0 %   BASO% 0.7 0.0 - 2.0 %  Comprehensive metabolic panel  Result Value Ref Range   Sodium 143 136 - 145 mEq/L   Potassium 4.0 3.5 - 5.1 mEq/L   Chloride 106 98 - 109 mEq/L   CO2 26 22 - 29 mEq/L   Glucose 107 70 - 140 mg/dl   BUN 10.0 7.0 - 26.0 mg/dL   Creatinine 0.8 0.6 - 1.1 mg/dL   Total Bilirubin 0.95 0.20 - 1.20 mg/dL   Alkaline Phosphatase 108 40 - 150 U/L   AST 12 5 - 34 U/L   ALT 9 0 - 55 U/L   Total Protein 7.5  6.4 - 8.3 g/dL   Albumin 3.6 3.5 - 5.0 g/dL   Calcium 9.5 8.4 - 10.4 mg/dL   Anion Gap 12 (H) 3 - 11 mEq/L   EGFR 85 (L) >90 ml/min/1.73 m2  Magnesium  Result Value Ref Range   Magnesium 2.1 1.5 - 2.5 mg/dl     Studies/Results: NUCLEAR MEDICINE PET SKULL BASE TO THIGH  07-19-2015 COMPARISON: Whole-body bone scan 07/01/2015, chest CT 06/28/2015 and abdominal CT 06/10/2015  FINDINGS: NECK  No hypermetabolic cervical lymph nodes are identified.There is mild asymmetric activity associated with the left palatine tonsil (SUV max 6.3). No focal lesion identified on CT imaging. No other abnormalities of the pharyngeal mucosal space seen. There is a right thyroid nodule measuring 12 mm on image 33, without associated abnormal metabolic activity.  CHEST  There is focal hypermetabolic activity in the right superior chest wall which appears to correspond with a  small subpectoral node on image 44. This has an SUV max of 10.2. There is extensive hypermetabolic right internal mammary nodal activity. This has mildly progressed compared with the prior chest CT, measuring up to 2.8 x 1.3 cm on image 76. This has an SUV max of 13.9. There are small hypermetabolic prevascular nodes (images 52 and 58). There is no hypermetabolic axillary adenopathy status post right chest wall reconstruction. There are no suspicious pulmonary findings.  ABDOMEN/PELVIS  There is no hypermetabolic activity within the liver, adrenal glands, spleen or pancreas. There is no hypermetabolic nodal activity.  SKELETON  There is extensive hypermetabolic osseous metastatic disease throughout the axial and proximal appendicular skeleton. This is much more extensive than and anticipated from the patient's prior studies. This extensive disease throughout the spine with prominent involvement at C7 and T1. As on prior chest CT, there is a pathologic fracture involving the T1 spinous process. Tumor at this level  has an SUV max of 19.3. There is multifocal disease throughout the pelvis with components in the right sacrum having an SUV max of 25.5 mm the left iliac bone an SUV max of 11.1. There is extensive proximal left femoral activity status post ORIF.  IMPRESSION: 1. Severe multifocal osseous metastatic disease, much greater than anticipated based on prior imaging. 2. Multifocal nodal metastases involving the right internal mammary, prevascular and subpectoral lymph nodes. No axillary pulmonary involvement identified. 3. No distant extra osseous metastases.  PACs images reviewed with patient and husband with help of interpreter at visit.   SURGICAL PATHOLOGY [place], [facility] Collected: 06/26/2015 Client: [place] Accession: [id number] Received: 06/27/2015 Rodell Perna REPORT OF SURGICAL PATHOLOGY ADDITIONAL INFORMATION: By immunohistochemistry, the tumor cells are positive for Her2 (3+). Claudette Laws MD Pathologist, Electronic Signature ( Signed 07/18/2015) PROGNOSTIC INDICATORS Results: IMMUNOHISTOCHEMICAL AND MORPHOMETRIC ANALYSIS PERFORMED MANUALLY Estrogen Receptor: 95%, POSITIVE, STRONG STAINING INTENSITY Progesterone Receptor: 2%, POSITIVE, STRONG STAINING INTENSITY REFERENCE RANGE ESTROGEN RECEPTOR NEGATIVE 0% POSITIVE =>1% REFERENCE RANGE PROGESTERONE RECEPTOR NEGATIVE 0% POSITIVE =>1% All controls stained appropriately    Medications: I have reviewed the patient's current medications. Stop ASA (used in hospital at bedrest prior to surgery). Written and oral instructions to take hydrocodone 5-325 one every 6 hrs, increase to 2 q 6 hr prn; prescription written now for #40, likely will need refill or adjustment when she is back at this office 08-05-15.She will start colace 100 mg 1-2x daily with pain meds and can add senokot if needed.  Prescriptions to pharmacy:  decadron 8 mg bid x 3 days begin day prior to taxotere, zofran  Ativan  Mentioned neulasta  with taxotere,  by on pro or  [facility] injection.   DISCUSSION PET information reviewed: extensive bone involvement which was not identified on other imaging, and lymph nodes in region of right breast,  but fortunately no visceral organ involvement.   Pathology information re ER and HER 2 discussed. Explained that HER 2 directed therapies now available should be useful as part of treatment for this cancer. I have told them that my partner and I would be most comfortable with chemotherapy (taxotere) together with herceptin and perjeta every 3 weeks as first therapy,  then may go to AI with continued herceptin and perjeta after ~ 6 cycles. She will need echocardiogram prior to HER 2 therapy, note 4 cycles adria with adjuvant therapy but no chest radiation.  Patient would prefer PAC, tho veins ok to start treatment if unable to get PAC placed prior to first  treatment.  She will have individual chemo teaching class with assistance of interpreter.   Patient has asked again what her prognosis is, particularly concerned that she might have < 6 months to live. They understand that this is not a situation of potential cure, but that we hope treatments will improve and control the disease, hopefully for a longer time than just months.  Medications as above. She agrees to try the hydrocodone 1 every 6 hrs for now. She is aware that she should call promptly if increased pain or weakness.   PT scheduled in EMR as "lymphedema PT" 7-24, 7-31, 8-7, tho patient understands this is PT related to orthopedic surgery -?  Husband may need FMLA papers as he will do transportation  NOTE patient will be in [place] thru 08-04-15 MD able to schedule echocardiogram AM 08-05-15, with chemo teaching and lab that day. Also able to schedule PAC for 08-06-15. Zometa due that week, chemo scheduling pending.   Assessment/Plan:  1. Metastatic ER, HER 2 +  breast cancer extensively involving axial and appendicular skeleton: history of  T1N1 premenopausal right breast carcinoma ER/PR +, HER 2 + diagnosed Nov 1999, no HER 2 therapy in 1999. Post nailing of impending fracture left femur by Dr [name] 06-26-15, radiation to complete today. Plan to start Herceptin, Perjeta and taxotere week of 08-05-15 as long as cardiac function ok. Increase pain medication as above.  and patient consents following chemo education session.  2. Echocardiogram to be done 08-05-15, prior adriamycin < 300 mg/m2, no chest radiation. Will need to repeat echo q 3 months on HER 2 therapy 3.congenital deafness 4.positional vertigo: followed by neurology. Note she does not tolerate lying on stomach due to vertigo, in case breast MRI considered 5.post laparoscopic cholecystectomy 2004 6.right TRAM with original breast cancer 1999 7.premature surgical menopause, post BSO 2005 for pelvic pain and ovarian cysts.7. 8..claustrophobia: required full sedation for the hip MRI. 9. Marked iron deficiency anemia: feraheme given 06-29-15 and 07-22-15. Hemoglobin better since IV iron, tho a little lower today possibly from RT to left hip. 10. IV access limited to LUE. Patient prefers PAC, to be done by IR on 08-06-15.    All questions answered, patient and husband followed discussion well with assistance of interpreter and are in agreement with recommendations and plans. Chemo and antibody orders placed. Managed care notified. Message to chemo education RNs to include teaching on neulasta (on pro or other) .  Time spent 60 min including >50% counseling and coordination of care.  Route PCP, cc Drs Sondra Come and Celene Skeen, MD   07/26/2015, 6:38 PM

## 2015-07-29 ENCOUNTER — Ambulatory Visit: Payer: Medicaid Other

## 2015-07-29 ENCOUNTER — Other Ambulatory Visit: Payer: Medicaid Other

## 2015-07-30 ENCOUNTER — Ambulatory Visit: Payer: Medicaid Other

## 2015-08-05 ENCOUNTER — Other Ambulatory Visit (HOSPITAL_BASED_OUTPATIENT_CLINIC_OR_DEPARTMENT_OTHER): Payer: Medicaid Other

## 2015-08-05 ENCOUNTER — Telehealth: Payer: Self-pay | Admitting: Oncology

## 2015-08-05 ENCOUNTER — Other Ambulatory Visit: Payer: Self-pay | Admitting: Oncology

## 2015-08-05 ENCOUNTER — Ambulatory Visit: Payer: Medicaid Other | Admitting: Physical Therapy

## 2015-08-05 ENCOUNTER — Ambulatory Visit (HOSPITAL_COMMUNITY)
Admission: RE | Admit: 2015-08-05 | Discharge: 2015-08-05 | Disposition: A | Discharge status: Home / Self Care | Payer: Medicaid Other | Source: Ambulatory Visit | Attending: Oncology | Admitting: Oncology

## 2015-08-05 ENCOUNTER — Telehealth: Payer: Self-pay | Admitting: *Deleted

## 2015-08-05 ENCOUNTER — Other Ambulatory Visit: Payer: Medicaid Other

## 2015-08-05 ENCOUNTER — Other Ambulatory Visit: Payer: Self-pay | Admitting: Radiology

## 2015-08-05 DIAGNOSIS — C7951 Secondary malignant neoplasm of bone: Principal | ICD-10-CM

## 2015-08-05 DIAGNOSIS — C50911 Malignant neoplasm of unspecified site of right female breast: Secondary | ICD-10-CM

## 2015-08-05 DIAGNOSIS — I340 Nonrheumatic mitral (valve) insufficiency: Secondary | ICD-10-CM | POA: Insufficient documentation

## 2015-08-05 DIAGNOSIS — C50919 Malignant neoplasm of unspecified site of unspecified female breast: Secondary | ICD-10-CM | POA: Insufficient documentation

## 2015-08-05 DIAGNOSIS — F329 Major depressive disorder, single episode, unspecified: Secondary | ICD-10-CM | POA: Diagnosis not present

## 2015-08-05 DIAGNOSIS — Z853 Personal history of malignant neoplasm of breast: Secondary | ICD-10-CM

## 2015-08-05 LAB — CBC WITH DIFFERENTIAL/PLATELET
BASO%: 0.5 % (ref 0.0–2.0)
BASOS ABS: 0 10*3/uL (ref 0.0–0.1)
EOS ABS: 0.1 10*3/uL (ref 0.0–0.5)
EOS%: 1.7 % (ref 0.0–7.0)
HCT: 32.3 % — ABNORMAL LOW (ref 34.8–46.6)
HGB: 10.8 g/dL — ABNORMAL LOW (ref 11.6–15.9)
LYMPH%: 17.7 % (ref 14.0–49.7)
MCH: 29.7 pg (ref 25.1–34.0)
MCHC: 33.6 g/dL (ref 31.5–36.0)
MCV: 88.2 fL (ref 79.5–101.0)
MONO#: 0.6 10*3/uL (ref 0.1–0.9)
MONO%: 11.4 % (ref 0.0–14.0)
NEUT%: 68.7 % (ref 38.4–76.8)
NEUTROS ABS: 3.5 10*3/uL (ref 1.5–6.5)
PLATELETS: 197 10*3/uL (ref 145–400)
RBC: 3.66 10*6/uL — AB (ref 3.70–5.45)
RDW: 14.7 % — ABNORMAL HIGH (ref 11.2–14.5)
WBC: 5.2 10*3/uL (ref 3.9–10.3)
lymph#: 0.9 10*3/uL (ref 0.9–3.3)

## 2015-08-05 LAB — COMPREHENSIVE METABOLIC PANEL
ALK PHOS: 87 U/L (ref 40–150)
ALT: 9 U/L (ref 0–55)
ANION GAP: 10 meq/L (ref 3–11)
AST: 13 U/L (ref 5–34)
Albumin: 3.4 g/dL — ABNORMAL LOW (ref 3.5–5.0)
BILIRUBIN TOTAL: 1.05 mg/dL (ref 0.20–1.20)
BUN: 10.6 mg/dL (ref 7.0–26.0)
CO2: 27 meq/L (ref 22–29)
Calcium: 9.3 mg/dL (ref 8.4–10.4)
Chloride: 106 mEq/L (ref 98–109)
Creatinine: 0.8 mg/dL (ref 0.6–1.1)
EGFR: 82 mL/min/{1.73_m2} — AB (ref >90)
Glucose: 92 mg/dl (ref 70–140)
Potassium: 3.9 mEq/L (ref 3.5–5.1)
Sodium: 143 mEq/L (ref 136–145)
TOTAL PROTEIN: 7.4 g/dL (ref 6.4–8.3)

## 2015-08-05 NOTE — Telephone Encounter (Signed)
Interpreted call received from patient inquiring about tomorrow's port-s-cath placement.  Reports bad experience before asking to be completely sedated.  called Tiffany with IR.  Informed patient an IV will be started, Ativan, Versed and Fentanyl are used which are used for colonoscopy.  These affect people differently but do heavily sedate where you do not remember the procedure. "I'm okay with that then, I will be there in the morning."

## 2015-08-05 NOTE — Telephone Encounter (Signed)
Medical Oncology  MD notified that patient did not keep appointment for individual chemo teaching with interpreter, patient stating she had chemo previously so did not need teaching.  Also she declined PAC by IR as scheduled, as she wants to be completely sedated for procedure.  She does need treatment education as last treatment with taxotere was ~ 2000 and she has not had herceptin or perjeta.  She will need consult with general surgery for PAC. Peripheral veins are ok to begin treatment prior to [facility], which is fortunate as we do not want to delay any longer than necessary.   With assistance of interpreter service, I have LM for her to be back in touch with this office so that we can assist. If possible, could have chemo education RN meet with her day of treatment 7-26. She will need to be reminded to begin bid decadron with food day prior to chemo. Can start referral to general surgeon for [facility] before I see her on 7-31 or I can discuss with her then and proceed from there.   Godfrey Pick, MD

## 2015-08-05 NOTE — Progress Notes (Signed)
  Echocardiogram 2D Echocardiogram has been performed.  [name] 08/05/2015, 12:26 PM

## 2015-08-06 ENCOUNTER — Other Ambulatory Visit: Payer: Self-pay | Admitting: Oncology

## 2015-08-06 ENCOUNTER — Encounter (HOSPITAL_COMMUNITY): Payer: Self-pay

## 2015-08-06 ENCOUNTER — Ambulatory Visit (HOSPITAL_COMMUNITY)
Admission: RE | Admit: 2015-08-06 | Discharge: 2015-08-06 | Disposition: A | Discharge status: Home / Self Care | Payer: Medicaid Other | Source: Ambulatory Visit | Attending: Oncology | Admitting: Oncology

## 2015-08-06 ENCOUNTER — Telehealth: Payer: Self-pay

## 2015-08-06 ENCOUNTER — Other Ambulatory Visit (HOSPITAL_COMMUNITY): Payer: Medicaid Other

## 2015-08-06 ENCOUNTER — Ambulatory Visit (HOSPITAL_COMMUNITY): Payer: Medicaid Other

## 2015-08-06 DIAGNOSIS — C7951 Secondary malignant neoplasm of bone: Principal | ICD-10-CM

## 2015-08-06 DIAGNOSIS — C50919 Malignant neoplasm of unspecified site of unspecified female breast: Secondary | ICD-10-CM

## 2015-08-06 HISTORY — PX: IR GENERIC HISTORICAL: IMG1180011

## 2015-08-06 LAB — CBC WITH DIFFERENTIAL/PLATELET
BASOS ABS: 0 10*3/uL (ref 0.0–0.1)
Basophils Relative: 1 %
EOS PCT: 2 %
Eosinophils Absolute: 0.1 10*3/uL (ref 0.0–0.7)
HCT: 32.2 % — ABNORMAL LOW (ref 36.0–46.0)
Hemoglobin: 10.9 g/dL — ABNORMAL LOW (ref 12.0–15.0)
LYMPHS PCT: 22 %
Lymphs Abs: 1.3 10*3/uL (ref 0.7–4.0)
MCH: 30.1 pg (ref 26.0–34.0)
MCHC: 33.9 g/dL (ref 30.0–36.0)
MCV: 89 fL (ref 78.0–100.0)
Monocytes Absolute: 0.6 10*3/uL (ref 0.1–1.0)
Monocytes Relative: 10 %
NEUTROS ABS: 4 10*3/uL (ref 1.7–7.7)
Neutrophils Relative %: 67 %
PLATELETS: 204 10*3/uL (ref 150–400)
RBC: 3.62 MIL/uL — AB (ref 3.87–5.11)
RDW: 13.6 % (ref 11.5–15.5)
WBC: 6 10*3/uL (ref 4.0–10.5)

## 2015-08-06 LAB — PROTIME-INR
INR: 1.13 (ref 0.00–1.49)
PROTHROMBIN TIME: 14.3 s (ref 11.6–15.2)

## 2015-08-06 LAB — APTT: APTT: 34 s (ref 24–37)

## 2015-08-06 MED ORDER — LIDOCAINE HCL 1 % IJ SOLN
INTRAMUSCULAR | Status: AC
  Filled 2015-08-06: qty 20

## 2015-08-06 MED ORDER — FENTANYL CITRATE (PF) 100 MCG/2ML IJ SOLN
INTRAMUSCULAR | Status: AC | PRN
  Administered 2015-08-06: 50 ug via INTRAVENOUS
  Administered 2015-08-06 (×2): 25 ug via INTRAVENOUS

## 2015-08-06 MED ORDER — FENTANYL CITRATE (PF) 100 MCG/2ML IJ SOLN
INTRAMUSCULAR | Status: AC
Start: 2015-08-06 — End: 2015-08-06
  Filled 2015-08-06: qty 2

## 2015-08-06 MED ORDER — FENTANYL CITRATE (PF) 100 MCG/2ML IJ SOLN
INTRAMUSCULAR | Status: AC
  Filled 2015-08-06: qty 2

## 2015-08-06 MED ORDER — HEPARIN SOD (PORK) LOCK FLUSH 100 UNIT/ML IV SOLN
INTRAVENOUS | Status: AC
  Filled 2015-08-06: qty 5

## 2015-08-06 MED ORDER — CEFAZOLIN SODIUM-DEXTROSE 2-4 GM/100ML-% IV SOLN
2.0000 g | Freq: Once | INTRAVENOUS | Status: AC
  Administered 2015-08-06: 2 g via INTRAVENOUS
  Filled 2015-08-06: qty 100

## 2015-08-06 MED ORDER — HEPARIN SOD (PORK) LOCK FLUSH 100 UNIT/ML IV SOLN
INTRAVENOUS | Status: AC | PRN
  Administered 2015-08-06: 500 [IU]

## 2015-08-06 MED ORDER — MIDAZOLAM HCL 2 MG/2ML IJ SOLN
INTRAMUSCULAR | Status: AC | PRN
  Administered 2015-08-06 (×6): 1 mg via INTRAVENOUS

## 2015-08-06 MED ORDER — SODIUM CHLORIDE 0.9 % IV SOLN: INTRAVENOUS | Status: DC

## 2015-08-06 MED ORDER — MIDAZOLAM HCL 2 MG/2ML IJ SOLN
INTRAMUSCULAR | Status: AC
  Filled 2015-08-06: qty 6

## 2015-08-06 NOTE — Consult Note (Signed)
Chief Complaint: Patient was seen in consultation today for port a cath placement Referring Physician(s): [place]  Supervising Physician: Aletta Edouard  Patient Status: Outpatient  History of Present Illness: [name] is a 50 y.o. female with history of metastatic breast cancer (initially diagnosed 1999) who presents today for Port-A-Cath placement for chemotherapy. She has had prior left chest wall Port-A-Cath placement by surgery which has since been removed. Recent PET scan revealed severe multifocal osseous metastatic disease with multifocal nodal metastases as well. Additional history as listed below.  Past Medical History:  Diagnosis Date  . Abnormal Pap smear   . Anxiety   . Breast cancer ([place])   . Cancer ([place]) 1999   breast-s/p mastectomy, chemo, rad  . CIN I (cervical intraepithelial neoplasia I) 2003   by colpo  . Congenital deafness   . Deaf   . Depression   . S/P bilateral oophorectomy     Past Surgical History:  Procedure Laterality Date  . ABDOMINAL HYSTERECTOMY    . INTRAMEDULLARY (IM) NAIL INTERTROCHANTERIC Left 06/26/2015   Procedure: LEFT BIOMET LONG AFFIXS NAIL;  Surgeon: [name], MD;  Location: [facility];  Service: Orthopedics;  Laterality: Left;  Marland Kitchen MASTECTOMY     right breast  . MASTECTOMY    . OVARIAN CYST REMOVAL    . RADIOLOGY WITH ANESTHESIA Left 06/25/2015   Procedure: MRI OF LEFT HIP WITH OR WITHOUT CONTRAST;  Surgeon: Medication Radiologist, MD;  Location: [facility];  Service: Radiology;  Laterality: Left;  DR. MCINTYRE/MRI  . TUBAL LIGATION      Allergies: Promethazine hcl and Diphenhydramine hcl  Medications: Prior to Admission medications   Medication Sig Start Date End Date Taking? Authorizing Provider  aspirin EC 325 MG EC tablet Take 1 tablet (325 mg total) by mouth daily with breakfast. 07/02/15  Yes [name], MD  divalproex (DEPAKOTE) 250 MG DR tablet Take 250 mg by mouth at bedtime. Takes 2 tablets   Yes  Historical Provider, MD  HYDROcodone-acetaminophen (NORCO/VICODIN) 5-325 MG tablet Take 1-2 tablets every 4-6 hrs as needed for pain. 07/26/15  Yes Lennis Marion Downer, MD  dexamethasone (DECADRON) 4 MG tablet Take 2 tablets with food twice a day for 3 days. Begin day before Taxotere Chemotherapy. 07/26/15   Lennis Marion Downer, MD  Ibuprofen (ADVIL) 200 MG CAPS Take 2 capsules by mouth as needed. Reported on 07/26/2015    Historical Provider, MD  lidocaine-prilocaine (EMLA) cream Apply 1 application topically as needed. Apply to Porta-Cath 1-2 hours prior to access as directed. 07/26/15   Lennis Marion Downer, MD  LORazepam (ATIVAN) 0.5 MG tablet May take 1 tablet by mouth or place under the tongue every 6 hrs as needed for nausea. 07/26/15   Lennis Marion Downer, MD  ondansetron (ZOFRAN) 8 MG tablet Take 1 tablet (8 mg total) by mouth every 8 (eight) hours as needed for nausea or vomiting. 07/26/15   Lennis Marion Downer, MD  senna-docusate (SENOKOT-S) 8.6-50 MG tablet Take 1 tablet by mouth 2 (two) times daily. Patient not taking: Reported on 07/08/2015 07/02/15   [name], MD     Family History  Problem Relation Age of Onset  . Hypertension Mother   . Seizures Mother   . Alzheimer's disease Maternal Uncle   . Pancreatitis Maternal Grandmother   . Heart attack Maternal Grandfather     Social History   Social History  . Marital status: Married    Spouse name: N/A  .  Number of children: 2  . Years of education: N/A   Social History Main Topics  . Smoking status: Never Smoker  . Smokeless tobacco: Never Used  . Alcohol use No  . Drug use: No  . Sexual activity: Yes    Birth control/ protection: Surgical   Other Topics Concern  . None   Social History Narrative  . None      Review of Systems patient currently denies fever, headache, dyspnea, cough, abdominal pain, nausea, vomiting or abnormal bleeding. She does have intermittent right chest discomfort and back pain. She is deaf.  Vital  Signs: Blood pressure 110/59, heart rate 86, temperature 97.7, respirations 16, O2 sat 100% room air   Physical Exam patient awake, alert. Chest clear to auscultation bilaterally. Heart with regular rate and rhythm. Abdomen soft, positive bowel sounds, nontender. Lower extremities with no edema.  Mallampati Score:     Imaging: Nm Pet Image Initial (pi) Skull Base To Thigh  Result Date: 07/19/2015 CLINICAL DATA:  Initial treatment strategy for right breast cancer with osseous metastatic disease. EXAM: NUCLEAR MEDICINE PET SKULL BASE TO THIGH TECHNIQUE: 6.54 mCi F-18 FDG was injected intravenously. Full-ring PET imaging was performed from the skull base to thigh after the radiotracer. CT data was obtained and used for attenuation correction and anatomic localization. FASTING BLOOD GLUCOSE:  Value: 101 mg/dl COMPARISON:  Whole-body bone scan 07/01/2015, chest CT 06/28/2015 and abdominal CT 06/10/2015 FINDINGS: NECK No hypermetabolic cervical lymph nodes are identified.There is mild asymmetric activity associated with the left palatine tonsil (SUV max 6.3). No focal lesion identified on CT imaging. No other abnormalities of the pharyngeal mucosal space seen. There is a right thyroid nodule measuring 12 mm on image 33, without associated abnormal metabolic activity. CHEST There is focal hypermetabolic activity in the right superior chest wall which appears to correspond with a small subpectoral node on image 44. This has an SUV max of 10.2. There is extensive hypermetabolic right internal mammary nodal activity. This has mildly progressed compared with the prior chest CT, measuring up to 2.8 x 1.3 cm on image 76. This has an SUV max of 13.9. There are small hypermetabolic prevascular nodes (images 52 and 58). There is no hypermetabolic axillary adenopathy status post right chest wall reconstruction. There are no suspicious pulmonary findings. ABDOMEN/PELVIS There is no hypermetabolic activity within the liver,  adrenal glands, spleen or pancreas. There is no hypermetabolic nodal activity. SKELETON There is extensive hypermetabolic osseous metastatic disease throughout the axial and proximal appendicular skeleton. This is much more extensive than and anticipated from the patient's prior studies. This extensive disease throughout the spine with prominent involvement at C7 and T1. As on prior chest CT, there is a pathologic fracture involving the T1 spinous process. Tumor at this level has an SUV max of 19.3. There is multifocal disease throughout the pelvis with components in the right sacrum having an SUV max of 25.5 mm the left iliac bone an SUV max of 11.1. There is extensive proximal left femoral activity status post ORIF. IMPRESSION: 1. Severe multifocal osseous metastatic disease, much greater than anticipated based on prior imaging. 2. Multifocal nodal metastases involving the right internal mammary, prevascular and subpectoral lymph nodes. No axillary pulmonary involvement identified. 3. No distant extra osseous metastases. Electronically Signed   By: Richardean Sale M.D.   On: 07/19/2015 14:30    Labs:  CBC:  Recent Labs  07/12/15 1203 07/26/15 1147 08/05/15 1027 08/06/15 1000  WBC 6.7 4.4 5.2 6.0  HGB 11.0* 11.2* 10.8* 10.9*  HCT 33.4* 33.5* 32.3* 32.2*  PLT 364 279 197 204    COAGS: No results for input(s): INR, APTT in the last 8760 hours.  BMP:  Recent Labs  07/01/15 0304 07/01/15 1108 07/01/15 1538 07/02/15 0354 07/08/15 1551 07/12/15 1203 07/26/15 1147 08/05/15 1027  NA 138 139 138 138 141 140 143 143  K 3.4* 3.4* 3.1* 3.6 4.0 4.4 4.0 3.9  CL 106 107 104 103  --   --   --   --   CO2 23 26 26 29 28 27 26 27   GLUCOSE 85 99 103* 95 105 95 107 92  BUN <5* <5* <5* <5* 10.5 11.2 10.0 10.6  CALCIUM 7.8* 8.0* 8.2* 8.3* 9.8 9.5 9.5 9.3  CREATININE 0.61 0.64 0.65 0.69 0.9 0.8 0.8 0.8  GFRNONAA >60 >60 >60 >60  --   --   --   --   GFRAA >60 >60 >60 >60  --   --   --   --      LIVER FUNCTION TESTS:  Recent Labs  07/08/15 1551 07/12/15 1203 07/26/15 1147 08/05/15 1027  BILITOT 0.88 0.80 0.95 1.05  AST 12 11 12 13   ALT 21 11 9 9   ALKPHOS 116 119 108 87  PROT 8.1 7.8 7.5 7.4  ALBUMIN 3.6 3.6 3.6 3.4*    TUMOR MARKERS:  Recent Labs  06/26/15 0340  CEA 9.1*    Assessment and Plan: 50 y.o. female with history of metastatic breast cancer (initially diagnosed 1999) who presents today for Port-A-Cath placement for chemotherapy. She has had prior left chest wall Port-A-Cath placement by surgery which has since been removed. Recent PET scan revealed severe multifocal osseous metastatic disease with multifocal nodal metastases as well. Risks and benefits discussed with the patient /husband via sign interpreter including, but not limited to bleeding, infection, pneumothorax, or fibrin sheath development and need for additional procedures.All of the patient's questions were answered, patient is agreeable to proceed.Consent signed and in chart.    Thank you for this interesting consult.  I greatly enjoyed meeting [name] and look forward to participating in their care.  A copy of this report was sent to the requesting provider on this date.  Electronically Signed: D. Rowe Robert 08/06/2015, 10:37 AM  I spent a total of  25 minutes   in face to face in clinical consultation, greater than 50% of which was counseling/coordinating care for port a cath placement

## 2015-08-06 NOTE — Procedures (Signed)
Interventional Radiology Procedure Note  Procedure: Placement of a right IJ approach single lumen PowerPort.  Tip is positioned at the superior cavoatrial junction and catheter is ready for immediate use.  Complications: No immediate Recommendations:  - Ok to shower tomorrow - Do not submerge for 7 days - Routine line care   Damarrion Mimbs T. Dianey Suchy, M.D Pager:  [phone]   

## 2015-08-06 NOTE — Telephone Encounter (Addendum)
Ms [name] did come to IR today for [facility] Placement. Spoke to patinet and husband with interpreter at [place] prior to the procedure. Reviewed appointment schedule and printed out a new schedule that had more detail about appointments as Ms [name] very confused about schedule. Husband will pick up EMLA cream, decadron, and ativan tablets from pharmacy today.  Will bring decadron to Zometa appointment tomorrow for further review of directions  For Taxotere  chemotherapy Friday 08-09-15 with Chemotherapy Education nurse.  Told Ms [name] that the Education nurse will give her information on the Perjeta and Herceptin as she has not received these in the past and needs to know specific potential side effects of each medication.  Pt agreeable to this instruction.  Reviewed use of chemotherapy alert card given to patient today.  Ms. [name] does not need a refill of pain medication at this time.  She has not been needing to use it.  Ms. [name] will not pick up the Zofran as she states it causes her to be sick.  Canceled labs for 7-26 and 08-09-15 as labs done 08-05-15 and are fine for zometa and treatment per Dr. Marko Plume. Muga sacn done 08-05-15  was fine per Dr Marko Plume.  Left message with the nurse educator that the decadron needs reviewing, the aches with taxotere, taking Claritin for aches  and the On Pro injector to be put on Friday 08-09-15 after treatment completed. Dr. Marko Plume believes that the injector is the optimal option and not wait ~ 2 days for  neulasta injection.

## 2015-08-06 NOTE — Discharge Instructions (Signed)

## 2015-08-07 ENCOUNTER — Ambulatory Visit: Payer: Medicaid Other

## 2015-08-07 ENCOUNTER — Other Ambulatory Visit: Payer: Medicaid Other

## 2015-08-07 ENCOUNTER — Ambulatory Visit (HOSPITAL_BASED_OUTPATIENT_CLINIC_OR_DEPARTMENT_OTHER): Payer: Medicaid Other

## 2015-08-07 ENCOUNTER — Encounter: Payer: Self-pay | Admitting: *Deleted

## 2015-08-07 DIAGNOSIS — C50911 Malignant neoplasm of unspecified site of right female breast: Secondary | ICD-10-CM | POA: Diagnosis not present

## 2015-08-07 DIAGNOSIS — C7951 Secondary malignant neoplasm of bone: Secondary | ICD-10-CM

## 2015-08-07 MED ORDER — ZOLEDRONIC ACID 4 MG/100ML IV SOLN
4.0000 mg | Freq: Once | INTRAVENOUS | Status: AC
  Administered 2015-08-07: 4 mg via INTRAVENOUS
  Filled 2015-08-07: qty 100

## 2015-08-07 MED ORDER — HEPARIN SOD (PORK) LOCK FLUSH 100 UNIT/ML IV SOLN
500.0000 [IU] | Freq: Once | INTRAVENOUS | Status: AC | PRN
Start: 2015-08-07 — End: 2015-08-07
  Administered 2015-08-07: 500 [IU]
  Filled 2015-08-07: qty 5

## 2015-08-07 MED ORDER — SODIUM CHLORIDE 0.9% FLUSH
10.0000 mL | INTRAVENOUS | Status: DC | PRN
  Administered 2015-08-07: 10 mL
  Filled 2015-08-07: qty 10

## 2015-08-07 MED ORDER — SODIUM CHLORIDE 0.9 % IV SOLN
Freq: Once | INTRAVENOUS | Status: AC
Start: 2015-08-07 — End: 2015-08-07
  Administered 2015-08-07: 13:00:00 via INTRAVENOUS

## 2015-08-07 NOTE — Patient Instructions (Signed)

## 2015-08-08 ENCOUNTER — Ambulatory Visit: Payer: Medicaid Other

## 2015-08-08 ENCOUNTER — Other Ambulatory Visit: Payer: Medicaid Other

## 2015-08-08 ENCOUNTER — Ambulatory Visit: Payer: Medicaid Other | Admitting: Oncology

## 2015-08-08 NOTE — Progress Notes (Signed)
°  Radiation Oncology         [phone] ________________________________  Name: [name] MRN: [id number]  Date: 07/26/2015  DOB: [birth year]  End of Treatment Note  ICD-9-CM ICD-10-CM     1. Bony metastasis ([place]) 198.5 C79.51     DIAGNOSIS: recurrent breast cancer with extensive osseous metastasis      Indication for treatment:  Palliative      Radiation treatment dates:   07/12/2015-07/26/2015  Site/dose:   Left femur, 30 Gy in 10 fractions.    Beams/energy:   Parallel Opposed, 10X, 6X  Narrative: The patient tolerated radiation treatment relatively well.  The patient did experience soreness and a tightness in her left leg during treatment.  She also reported some soreness in her right chest and shoulder.     Plan: The patient has completed radiation treatment. The patient will return to radiation oncology clinic for routine followup in one month. I advised them to call or return sooner if they have any questions or concerns related to their recovery or treatment.  -----------------------------------  Blair Promise, PhD, MD  This document serves as a record of services personally performed by Gery Pray, MD. It was created on his behalf by Truddie Hidden, a trained medical scribe. The creation of this record is based on the scribe's personal observations and the provider's statements to them. This document has been checked and approved by the attending provider.

## 2015-08-09 ENCOUNTER — Other Ambulatory Visit: Payer: Self-pay | Admitting: Oncology

## 2015-08-09 ENCOUNTER — Other Ambulatory Visit (HOSPITAL_COMMUNITY): Payer: Self-pay | Admitting: *Deleted

## 2015-08-09 ENCOUNTER — Other Ambulatory Visit: Payer: Medicaid Other

## 2015-08-09 ENCOUNTER — Ambulatory Visit: Payer: Medicaid Other

## 2015-08-09 ENCOUNTER — Ambulatory Visit (HOSPITAL_BASED_OUTPATIENT_CLINIC_OR_DEPARTMENT_OTHER): Payer: Medicaid Other

## 2015-08-09 VITALS — BP 142/78 | HR 64 | Temp 97.7°F | Resp 18

## 2015-08-09 DIAGNOSIS — C7951 Secondary malignant neoplasm of bone: Secondary | ICD-10-CM | POA: Diagnosis not present

## 2015-08-09 DIAGNOSIS — C50911 Malignant neoplasm of unspecified site of right female breast: Secondary | ICD-10-CM

## 2015-08-09 DIAGNOSIS — Z5112 Encounter for antineoplastic immunotherapy: Secondary | ICD-10-CM | POA: Diagnosis not present

## 2015-08-09 DIAGNOSIS — Z5111 Encounter for antineoplastic chemotherapy: Secondary | ICD-10-CM

## 2015-08-09 MED ORDER — LORAZEPAM 2 MG/ML IJ SOLN
0.5000 mg | Freq: Once | INTRAMUSCULAR | Status: AC
  Administered 2015-08-09: 0.5 mg via INTRAVENOUS

## 2015-08-09 MED ORDER — PEGFILGRASTIM 6 MG/0.6ML ~~LOC~~ PSKT
6.0000 mg | PREFILLED_SYRINGE | Freq: Once | SUBCUTANEOUS | Status: AC
  Administered 2015-08-09: 6 mg via SUBCUTANEOUS
  Filled 2015-08-09: qty 0.6

## 2015-08-09 MED ORDER — SODIUM CHLORIDE 0.9 % IV SOLN
Freq: Once | INTRAVENOUS | Status: AC
  Administered 2015-08-09: 14:00:00 via INTRAVENOUS

## 2015-08-09 MED ORDER — SODIUM CHLORIDE 0.9 % IV SOLN
10.0000 mg | Freq: Once | INTRAVENOUS | Status: AC
  Administered 2015-08-09: 10 mg via INTRAVENOUS
  Filled 2015-08-09: qty 1

## 2015-08-09 MED ORDER — SODIUM CHLORIDE 0.9 % IV SOLN
840.0000 mg | Freq: Once | INTRAVENOUS | Status: AC
  Administered 2015-08-09: 840 mg via INTRAVENOUS
  Filled 2015-08-09: qty 28

## 2015-08-09 MED ORDER — HEPARIN SOD (PORK) LOCK FLUSH 100 UNIT/ML IV SOLN
500.0000 [IU] | Freq: Once | INTRAVENOUS | Status: AC | PRN
  Administered 2015-08-09: 500 [IU]
  Filled 2015-08-09: qty 5

## 2015-08-09 MED ORDER — SODIUM CHLORIDE 0.9 % IV SOLN
75.0000 mg/m2 | Freq: Once | INTRAVENOUS | Status: AC
  Administered 2015-08-09: 130 mg via INTRAVENOUS
  Filled 2015-08-09: qty 13

## 2015-08-09 MED ORDER — DIPHENHYDRAMINE HCL 25 MG PO CAPS
50.0000 mg | ORAL_CAPSULE | Freq: Once | ORAL | Status: DC
Start: 2015-08-09 — End: 2015-08-09

## 2015-08-09 MED ORDER — SODIUM CHLORIDE 0.9 % IV SOLN
Freq: Once | INTRAVENOUS | Status: DC
  Filled 2015-08-09: qty 4

## 2015-08-09 MED ORDER — LORATADINE 10 MG PO TABS
10.0000 mg | ORAL_TABLET | Freq: Every day | ORAL | Status: DC
  Administered 2015-08-09: 10 mg via ORAL
  Filled 2015-08-09: qty 1

## 2015-08-09 MED ORDER — SODIUM CHLORIDE 0.9% FLUSH
10.0000 mL | INTRAVENOUS | Status: DC | PRN
  Administered 2015-08-09: 10 mL
  Filled 2015-08-09: qty 10

## 2015-08-09 MED ORDER — TRASTUZUMAB CHEMO 150 MG IV SOLR
8.0000 mg/kg | Freq: Once | INTRAVENOUS | Status: AC
  Administered 2015-08-09: 483 mg via INTRAVENOUS
  Filled 2015-08-09: qty 23

## 2015-08-09 MED ORDER — FAMOTIDINE IN NACL 20-0.9 MG/50ML-% IV SOLN
INTRAVENOUS | Status: AC
  Filled 2015-08-09: qty 50

## 2015-08-09 MED ORDER — LORAZEPAM 2 MG/ML IJ SOLN
INTRAMUSCULAR | Status: AC
  Filled 2015-08-09: qty 1

## 2015-08-09 MED ORDER — ACETAMINOPHEN 325 MG PO TABS
ORAL_TABLET | ORAL | Status: AC
  Filled 2015-08-09: qty 2

## 2015-08-09 MED ORDER — ACETAMINOPHEN 325 MG PO TABS
650.0000 mg | ORAL_TABLET | Freq: Once | ORAL | Status: AC
  Administered 2015-08-09: 650 mg via ORAL

## 2015-08-09 NOTE — Patient Instructions (Signed)
[facility] Discharge Instructions for Patients Receiving Chemotherapy  Today you received the following chemotherapy agents Herceptin/ Perjeta/ Taxotere  To help prevent nausea and vomiting after your treatment, we encourage you to take your nausea medication as directed by your doctor.   If you develop nausea and vomiting that is not controlled by your nausea medication, call the clinic.   BELOW ARE SYMPTOMS THAT SHOULD BE REPORTED IMMEDIATELY:  *FEVER GREATER THAN 100.5 F  *CHILLS WITH OR WITHOUT FEVER  NAUSEA AND VOMITING THAT IS NOT CONTROLLED WITH YOUR NAUSEA MEDICATION  *UNUSUAL SHORTNESS OF BREATH  *UNUSUAL BRUISING OR BLEEDING  TENDERNESS IN MOUTH AND THROAT WITH OR WITHOUT PRESENCE OF ULCERS  *URINARY PROBLEMS  *BOWEL PROBLEMS  UNUSUAL RASH Items with * indicate a potential emergency and should be followed up as soon as possible.  Feel free to call the clinic you have any questions or concerns. The clinic phone number is [phone].  Please show the [facility] at check-in to the Emergency Department and triage nurse.  Trastuzumab injection for infusion HERCEPTIN What is this medicine? TRASTUZUMAB (tras TOO zoo mab) is a monoclonal antibody. It is used to treat breast cancer and stomach cancer. This medicine may be used for other purposes; ask your health care provider or pharmacist if you have questions. What should I tell my health care provider before I take this medicine? They need to know if you have any of these conditions: -heart disease -heart failure -infection (especially a virus infection such as chickenpox, cold sores, or herpes) -lung or breathing disease, like asthma -recent or ongoing radiation therapy -an unusual or allergic reaction to trastuzumab, benzyl alcohol, or other medications, foods, dyes, or preservatives -pregnant or trying to get pregnant -breast-feeding How should I use this medicine? This drug is given  as an infusion into a vein. It is administered in a hospital or clinic by a specially trained health care professional. Talk to your pediatrician regarding the use of this medicine in children. This medicine is not approved for use in children. Overdosage: If you think you have taken too much of this medicine contact a poison control center or emergency room at once. NOTE: This medicine is only for you. Do not share this medicine with others. What if I miss a dose? It is important not to miss a dose. Call your doctor or health care professional if you are unable to keep an appointment. What may interact with this medicine? -doxorubicin -warfarin This list may not describe all possible interactions. Give your health care provider a list of all the medicines, herbs, non-prescription drugs, or dietary supplements you use. Also tell them if you smoke, drink alcohol, or use illegal drugs. Some items may interact with your medicine. What should I watch for while using this medicine? Visit your doctor for checks on your progress. Report any side effects. Continue your course of treatment even though you feel ill unless your doctor tells you to stop. Call your doctor or health care professional for advice if you get a fever, chills or sore throat, or other symptoms of a cold or flu. Do not treat yourself. Try to avoid being around people who are sick. You may experience fever, chills and shaking during your first infusion. These effects are usually mild and can be treated with other medicines. Report any side effects during the infusion to your health care professional. Fever and chills usually do not happen with later infusions. Do not become pregnant  while taking this medicine or for 7 months after stopping it. Women should inform their doctor if they wish to become pregnant or think they might be pregnant. Women of child-bearing potential will need to have a negative pregnancy test before starting this  medicine. There is a potential for serious side effects to an unborn child. Talk to your health care professional or pharmacist for more information. Do not breast-feed an infant while taking this medicine or for 7 months after stopping it. Women must use effective birth control with this medicine. What side effects may I notice from receiving this medicine? Side effects that you should report to your doctor or other health care professional as soon as possible: -breathing difficulties -chest pain or palpitations -cough -dizziness or fainting -fever or chills, sore throat -skin rash, itching or hives -swelling of the legs or ankles -unusually weak or tired Side effects that usually do not require medical attention (report to your doctor or other health care professional if they continue or are bothersome): -loss of appetite -headache -muscle aches -nausea This list may not describe all possible side effects. Call your doctor for medical advice about side effects. You may report side effects to FDA at 1-800-FDA-1088. Where should I keep my medicine? This drug is given in a hospital or clinic and will not be stored at home. NOTE: This sheet is a summary. It may not cover all possible information. If you have questions about this medicine, talk to your doctor, pharmacist, or health care provider.    2016, Elsevier/Gold Standard. (2014-04-06 11:49:32)   Pertuzumab injection PERJETA What is this medicine? PERTUZUMAB (per TOOZ ue mab) is a monoclonal antibody. It is used to treat breast cancer. This medicine may be used for other purposes; ask your health care provider or pharmacist if you have questions. What should I tell my health care provider before I take this medicine? They need to know if you have any of these conditions: -heart disease -heart failure -high blood pressure -history of irregular heart beat -recent or ongoing radiation therapy -an unusual or allergic reaction to  pertuzumab, other medicines, foods, dyes, or preservatives -pregnant or trying to get pregnant -breast-feeding How should I use this medicine? This medicine is for infusion into a vein. It is given by a health care professional in a hospital or clinic setting. Talk to your pediatrician regarding the use of this medicine in children. Special care may be needed. Overdosage: If you think you have taken too much of this medicine contact a poison control center or emergency room at once. NOTE: This medicine is only for you. Do not share this medicine with others. What if I miss a dose? It is important not to miss your dose. Call your doctor or health care professional if you are unable to keep an appointment. What may interact with this medicine? Interactions are not expected. Give your health care provider a list of all the medicines, herbs, non-prescription drugs, or dietary supplements you use. Also tell them if you smoke, drink alcohol, or use illegal drugs. Some items may interact with your medicine. This list may not describe all possible interactions. Give your health care provider a list of all the medicines, herbs, non-prescription drugs, or dietary supplements you use. Also tell them if you smoke, drink alcohol, or use illegal drugs. Some items may interact with your medicine. What should I watch for while using this medicine? Your condition will be monitored carefully while you are receiving this medicine. Report  any side effects. Continue your course of treatment even though you feel ill unless your doctor tells you to stop. Do not become pregnant while taking this medicine or for 7 months after stopping it. Women should inform their doctor if they wish to become pregnant or think they might be pregnant. Women of child-bearing potential will need to have a negative pregnancy test before starting this medicine. There is a potential for serious side effects to an unborn child. Talk to your  health care professional or pharmacist for more information. Do not breast-feed an infant while taking this medicine or for 7 months after stopping it. Women must use effective birth control with this medicine. Call your doctor or health care professional for advice if you get a fever, chills or sore throat, or other symptoms of a cold or flu. Do not treat yourself. Try to avoid being around people who are sick. You may experience fever, chills, and headache during the infusion. Report any side effects during the infusion to your health care professional. What side effects may I notice from receiving this medicine? Side effects that you should report to your doctor or health care professional as soon as possible: -breathing problems -chest pain or palpitations -dizziness -feeling faint or lightheaded -fever or chills -skin rash, itching or hives -sore throat -swelling of the face, lips, or tongue -swelling of the legs or ankles -unusually weak or tired Side effects that usually do not require medical attention (Report these to your doctor or health care professional if they continue or are bothersome.): -diarrhea -hair loss -nausea, vomiting -tiredness This list may not describe all possible side effects. Call your doctor for medical advice about side effects. You may report side effects to FDA at 1-800-FDA-1088. Where should I keep my medicine? This drug is given in a hospital or clinic and will not be stored at home. NOTE: This sheet is a summary. It may not cover all possible information. If you have questions about this medicine, talk to your doctor, pharmacist, or health care provider.    2016, Elsevier/Gold Standard. (2014-04-06 16:07:57)  Docetaxel injection TAXOTERE What is this medicine? DOCETAXEL (doe se TAX el) is a chemotherapy drug. It targets fast dividing cells, like cancer cells, and causes these cells to die. This medicine is used to treat many types of cancers like  breast cancer, certain stomach cancers, head and neck cancer, lung cancer, and prostate cancer. This medicine may be used for other purposes; ask your health care provider or pharmacist if you have questions. What should I tell my health care provider before I take this medicine? They need to know if you have any of these conditions: -infection (especially a virus infection such as chickenpox, cold sores, or herpes) -liver disease -low blood counts, like low white cell, platelet, or red cell counts -an unusual or allergic reaction to docetaxel, polysorbate 80, other chemotherapy agents, other medicines, foods, dyes, or preservatives -pregnant or trying to get pregnant -breast-feeding How should I use this medicine? This drug is given as an infusion into a vein. It is administered in a hospital or clinic by a specially trained health care professional. Talk to your pediatrician regarding the use of this medicine in children. Special care may be needed. Overdosage: If you think you have taken too much of this medicine contact a poison control center or emergency room at once. NOTE: This medicine is only for you. Do not share this medicine with others. What if I miss a dose?  It is important not to miss your dose. Call your doctor or health care professional if you are unable to keep an appointment. What may interact with this medicine? -cyclosporine -erythromycin -ketoconazole -medicines to increase blood counts like filgrastim, pegfilgrastim, sargramostim -vaccines Talk to your doctor or health care professional before taking any of these medicines: -acetaminophen -aspirin -ibuprofen -ketoprofen -naproxen This list may not describe all possible interactions. Give your health care provider a list of all the medicines, herbs, non-prescription drugs, or dietary supplements you use. Also tell them if you smoke, drink alcohol, or use illegal drugs. Some items may interact with your  medicine. What should I watch for while using this medicine? Your condition will be monitored carefully while you are receiving this medicine. You will need important blood work done while you are taking this medicine. This drug may make you feel generally unwell. This is not uncommon, as chemotherapy can affect healthy cells as well as cancer cells. Report any side effects. Continue your course of treatment even though you feel ill unless your doctor tells you to stop. In some cases, you may be given additional medicines to help with side effects. Follow all directions for their use. Call your doctor or health care professional for advice if you get a fever, chills or sore throat, or other symptoms of a cold or flu. Do not treat yourself. This drug decreases your body's ability to fight infections. Try to avoid being around people who are sick. This medicine may increase your risk to bruise or bleed. Call your doctor or health care professional if you notice any unusual bleeding. This medicine may contain alcohol in the product. You may get drowsy or dizzy. Do not drive, use machinery, or do anything that needs mental alertness until you know how this medicine affects you. Do not stand or sit up quickly, especially if you are an older patient. This reduces the risk of dizzy or fainting spells. Avoid alcoholic drinks. Do not become pregnant while taking this medicine. Women should inform their doctor if they wish to become pregnant or think they might be pregnant. There is a potential for serious side effects to an unborn child. Talk to your health care professional or pharmacist for more information. Do not breast-feed an infant while taking this medicine. What side effects may I notice from receiving this medicine? Side effects that you should report to your doctor or health care professional as soon as possible: -allergic reactions like skin rash, itching or hives, swelling of the face, lips, or  tongue -low blood counts - This drug may decrease the number of white blood cells, red blood cells and platelets. You may be at increased risk for infections and bleeding. -signs of infection - fever or chills, cough, sore throat, pain or difficulty passing urine -signs of decreased platelets or bleeding - bruising, pinpoint red spots on the skin, black, tarry stools, nosebleeds -signs of decreased red blood cells - unusually weak or tired, fainting spells, lightheadedness -breathing problems -fast or irregular heartbeat -low blood pressure -mouth sores -nausea and vomiting -pain, swelling, redness or irritation at the injection site -pain, tingling, numbness in the hands or feet -swelling of the ankle, feet, hands -weight gain Side effects that usually do not require medical attention (report to your prescriber or health care professional if they continue or are bothersome): -bone pain -complete hair loss including hair on your head, underarms, pubic hair, eyebrows, and eyelashes -diarrhea -excessive tearing -changes in the color of fingernails -  loosening of the fingernails -nausea -muscle pain -red flush to skin -sweating -weak or tired This list may not describe all possible side effects. Call your doctor for medical advice about side effects. You may report side effects to FDA at 1-800-FDA-1088. Where should I keep my medicine? This drug is given in a hospital or clinic and will not be stored at home. NOTE: This sheet is a summary. It may not cover all possible information. If you have questions about this medicine, talk to your doctor, pharmacist, or health care provider.    2016, Elsevier/Gold Standard. (2014-01-15 16:04:57)

## 2015-08-09 NOTE — Progress Notes (Signed)
54- Pt states that she is allergic to benadryl and will not take it as her pre medication for her chemo treatment. Consulted pharmacy and suggested that she could take claritin as an alternative. Notified Dr. Marko Plume and will change in treatment plan today and for future. Pt notified and is agreeable to continue with alternative claritin with tylenol for pre medication.   1330- Pt completed first treatment of Herceptin and Perjeta. Currently on 1hr post observation treatment. Pt doing well and currently has no complaints at this time. 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   1415-Pt states that she is unable to have zofran po or iv. She states that when she had it before, she had really bad palpitations and caused her to be extremely nauseated. She is refusing to take zofran. Notified pt that she had zofran at home, per home med list. Pt states that she does not take it at all. Notified Dr. Marko Plume and consulted pharmacy on which medication is best for her to take pre medication for taxotere. Obtained order for 0.5 IV ativan and will follow up with Dr. Marko Plume on Monday to review all allergy medication. Pt tolerated ativan w/o any complaints.

## 2015-08-12 ENCOUNTER — Telehealth: Payer: Self-pay | Admitting: Oncology

## 2015-08-12 ENCOUNTER — Ambulatory Visit (HOSPITAL_BASED_OUTPATIENT_CLINIC_OR_DEPARTMENT_OTHER): Payer: Medicaid Other | Admitting: Oncology

## 2015-08-12 ENCOUNTER — Ambulatory Visit: Payer: Medicaid Other

## 2015-08-12 ENCOUNTER — Telehealth: Payer: Self-pay

## 2015-08-12 ENCOUNTER — Encounter: Payer: Self-pay | Admitting: Oncology

## 2015-08-12 ENCOUNTER — Ambulatory Visit (HOSPITAL_BASED_OUTPATIENT_CLINIC_OR_DEPARTMENT_OTHER): Payer: Medicaid Other

## 2015-08-12 ENCOUNTER — Ambulatory Visit: Payer: Medicaid Other | Admitting: Physical Therapy

## 2015-08-12 ENCOUNTER — Other Ambulatory Visit: Payer: Self-pay | Admitting: Oncology

## 2015-08-12 ENCOUNTER — Other Ambulatory Visit (HOSPITAL_BASED_OUTPATIENT_CLINIC_OR_DEPARTMENT_OTHER): Payer: Medicaid Other

## 2015-08-12 ENCOUNTER — Other Ambulatory Visit: Payer: Medicaid Other

## 2015-08-12 VITALS — BP 95/54 | HR 71 | Temp 97.7°F | Resp 18 | Ht 67.0 in | Wt 131.9 lb

## 2015-08-12 DIAGNOSIS — C7951 Secondary malignant neoplasm of bone: Secondary | ICD-10-CM

## 2015-08-12 DIAGNOSIS — E860 Dehydration: Secondary | ICD-10-CM

## 2015-08-12 DIAGNOSIS — C50919 Malignant neoplasm of unspecified site of unspecified female breast: Secondary | ICD-10-CM

## 2015-08-12 DIAGNOSIS — R112 Nausea with vomiting, unspecified: Secondary | ICD-10-CM

## 2015-08-12 DIAGNOSIS — R198 Other specified symptoms and signs involving the digestive system and abdomen: Secondary | ICD-10-CM

## 2015-08-12 DIAGNOSIS — D509 Iron deficiency anemia, unspecified: Secondary | ICD-10-CM

## 2015-08-12 DIAGNOSIS — G893 Neoplasm related pain (acute) (chronic): Secondary | ICD-10-CM

## 2015-08-12 DIAGNOSIS — R1111 Vomiting without nausea: Secondary | ICD-10-CM | POA: Diagnosis not present

## 2015-08-12 DIAGNOSIS — H905 Unspecified sensorineural hearing loss: Secondary | ICD-10-CM

## 2015-08-12 DIAGNOSIS — C50911 Malignant neoplasm of unspecified site of right female breast: Secondary | ICD-10-CM

## 2015-08-12 DIAGNOSIS — Z889 Allergy status to unspecified drugs, medicaments and biological substances status: Secondary | ICD-10-CM

## 2015-08-12 DIAGNOSIS — Z95828 Presence of other vascular implants and grafts: Secondary | ICD-10-CM

## 2015-08-12 DIAGNOSIS — T451X5A Adverse effect of antineoplastic and immunosuppressive drugs, initial encounter: Secondary | ICD-10-CM

## 2015-08-12 LAB — CBC WITH DIFFERENTIAL/PLATELET
BASO%: 0.4 % (ref 0.0–2.0)
Basophils Absolute: 0 10*3/uL (ref 0.0–0.1)
EOS ABS: 0 10*3/uL (ref 0.0–0.5)
EOS%: 0 % (ref 0.0–7.0)
HEMATOCRIT: 32.8 % — AB (ref 34.8–46.6)
HGB: 11.3 g/dL — ABNORMAL LOW (ref 11.6–15.9)
LYMPH%: 9.3 % — AB (ref 14.0–49.7)
MCH: 30.1 pg (ref 25.1–34.0)
MCHC: 34.5 g/dL (ref 31.5–36.0)
MCV: 87.5 fL (ref 79.5–101.0)
MONO#: 0 10*3/uL — AB (ref 0.1–0.9)
MONO%: 0.2 % (ref 0.0–14.0)
NEUT%: 90.1 % — AB (ref 38.4–76.8)
NEUTROS ABS: 8.2 10*3/uL — AB (ref 1.5–6.5)
PLATELETS: 137 10*3/uL — AB (ref 145–400)
RBC: 3.75 10*6/uL (ref 3.70–5.45)
RDW: 13.2 % (ref 11.2–14.5)
WBC: 9.1 10*3/uL (ref 3.9–10.3)
lymph#: 0.9 10*3/uL (ref 0.9–3.3)

## 2015-08-12 LAB — COMPREHENSIVE METABOLIC PANEL
ALT: 26 U/L (ref 0–55)
ANION GAP: 8 meq/L (ref 3–11)
AST: 24 U/L (ref 5–34)
Albumin: 3.2 g/dL — ABNORMAL LOW (ref 3.5–5.0)
Alkaline Phosphatase: 126 U/L (ref 40–150)
BILIRUBIN TOTAL: 1.23 mg/dL — AB (ref 0.20–1.20)
BUN: 19.3 mg/dL (ref 7.0–26.0)
CALCIUM: 8.8 mg/dL (ref 8.4–10.4)
CO2: 30 meq/L — AB (ref 22–29)
Chloride: 102 mEq/L (ref 98–109)
Creatinine: 0.8 mg/dL (ref 0.6–1.1)
Glucose: 84 mg/dl (ref 70–140)
Potassium: 4 mEq/L (ref 3.5–5.1)
Sodium: 141 mEq/L (ref 136–145)
TOTAL PROTEIN: 6.7 g/dL (ref 6.4–8.3)

## 2015-08-12 MED ORDER — SODIUM CHLORIDE 0.9 % IV SOLN
Freq: Once | INTRAVENOUS | Status: AC
  Administered 2015-08-12: 16:00:00 via INTRAVENOUS

## 2015-08-12 MED ORDER — PANTOPRAZOLE SODIUM 40 MG IV SOLR
40.0000 mg | Freq: Once | INTRAVENOUS | Status: AC
  Administered 2015-08-12: 40 mg via INTRAVENOUS
  Filled 2015-08-12: qty 40

## 2015-08-12 MED ORDER — HEPARIN SOD (PORK) LOCK FLUSH 100 UNIT/ML IV SOLN
500.0000 [IU] | Freq: Once | INTRAVENOUS | Status: AC
Start: 2015-08-12 — End: 2015-08-12
  Administered 2015-08-12: 500 [IU] via INTRAVENOUS
  Filled 2015-08-12: qty 5

## 2015-08-12 MED ORDER — SODIUM CHLORIDE 0.9 % IV SOLN
4.0000 mg | Freq: Once | INTRAVENOUS | Status: DC
  Filled 2015-08-12: qty 0.4

## 2015-08-12 MED ORDER — SODIUM CHLORIDE 0.9 % IV SOLN
Freq: Once | INTRAVENOUS | Status: DC
  Filled 2015-08-12: qty 4

## 2015-08-12 MED ORDER — SODIUM CHLORIDE 0.9% FLUSH
10.0000 mL | INTRAVENOUS | Status: DC | PRN
  Administered 2015-08-12: 10 mL via INTRAVENOUS
  Filled 2015-08-12: qty 10

## 2015-08-12 MED ORDER — HYDROCODONE-ACETAMINOPHEN 5-325 MG PO TABS: ORAL_TABLET | ORAL | 0 refills | Status: DC

## 2015-08-12 MED ORDER — SODIUM CHLORIDE 0.9 % IV SOLN
Freq: Once | INTRAVENOUS | Status: AC
  Administered 2015-08-12: 16:00:00 via INTRAVENOUS
  Filled 2015-08-12: qty 4

## 2015-08-12 NOTE — Progress Notes (Signed)
Post vital signs stable.  Patient ambulates well and has no signs of nausea at discharge.

## 2015-08-12 NOTE — Patient Instructions (Signed)

## 2015-08-12 NOTE — Telephone Encounter (Signed)
[name] has been have difficulty with nausea and vomiting this weekend. She saw Dr Marko Plume today and received IVF after visit.

## 2015-08-12 NOTE — Telephone Encounter (Signed)
appts made per LL 7/31 pof and avs given to pt

## 2015-08-12 NOTE — Progress Notes (Signed)
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  She had nailing of impending fracture left femur 06-26-15 with radiation thru 07-26-15,  second monthly zometa 08-07-15,  and first taxotere herceptin perjeta on 08-09-15. She used on pro neulasta with taxotere and confirms that she used oral decadron x 3 days around taxotere as instructed.  She had full chemo teaching prior to first cycle and has consented to systemic therapy.  Patient had PAC placed by IR 08-06-15, tolerated placement without problems using conscious Versed and Fentanyl. PAC functioned well for treatment last week.    Prior to treatment on 08-09-15,  patient reported "allergy" to zofran, benadryl, and phenergan, such that she received decadron, ativan and claritin as premeds on 08-09-15 and has used only ativan for nausea since chemo. I have subsequently searched EPIC, with information that she received 4 mg zofran in ED on 5-28 and 4 mg zofran with anesthesia on 06-26-15, with no allergic problems documented. With assistance of interpreter now, patient states that by "allergy" to zofran,  she meant that it was not effective, and denies any adverse effects from the drug. She received 8 mg IV zofran with IVF following MD discussion today, which helped nausea and did not cause any adverse effects. I have explained to patient that possibly the previous zofran dose was lower than optimal for her. Patient's She reports itching and marked tachycardia with benadryl and same with phenergan.   Patient has had nausea beginning 7-29 after chemo on 7-28, ongoing. She has vomited at times, including at  office prior to this visit. She has had loose stools 2-3x daily for past 2 days, including x2 by afternoon appointment today. She has metallic taste so is not eating and is drinking very little, cannot tolerate sprite or coke. She feels generally weak, seems to be with dehydration. She lost hydrocodone while traveling to [facility] July 14-21, has had no pain medication since sometime during that trip. She has some soreness and left leg somewhat weak as she has had since surgery, apparently outpatient PT not possible. She reports only minimal discomfort in shoulders/ upper back, no significant pain in spine, no acute pain otherwise. She denies SOB, fever, bleeding, swelling LE. She is a little sore at recently placed PAC, improving.  Left leg is painful when she sits in recliner in treatment area.  Remainder of 10 point Review of Systems negative/ unchanged.   PAC by IR 08-06-15 Feraheme 06-29-15 and 07-22-15 Genetics testing negative by Breast Ovarian panel 03-2015 [facility] 03-15-15 heterogeneously dense tissue otherwise negative. CA 2729 on 06-26-15: 72.5  Patient reports that she enjoyed trip to [place] between end of radiation and start of chemotherapy, this at her request.  Daughter is to pick her up from office today. No family here during this encounter.  ONCOLOGIC HISTORY  Patient had 1.4 cm poorly differentiated right breast carcinoma diagnosed in Nov 1999 at age 50, 1/7 axillary nodes involved, ER/PR and HER 2 positive. She had mastectomy with the 7 axillary node evaluation, 4 cycles of adriamycin/ cytoxan followed by taxotere, then five years of tamoxifen thru June 2005 (no herceptin in 1999). She did not have local radiation. She had right  TRAM reconstruction. She had bilateral salpingoophorectomy in May 2005. She was briefly on aromatase inhibitor after tamoxifen, but discontinued this herself due to poor tolerance. Most recent left mammogram was done as screening on  04-15-2012 with heterogeneously dense tissue and questioned asymmetry, followed by diagnostic left mamogram on 04-28-12 which had no findings of concern. 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 She had nailing of impending left femur fracture by Dr [name] on 06-26-15. Pathology [id number] from 06-26-15 showed metastatic adenocarcinoma , strongly ER positive 95%,  PR positive 2%, HER 2 positive 3+. Plain xrays suggested T1 and left 4th rib involvement, tho these also did not show on bone scan 07-01-15; like unremarkable CT, bone scan showed only activity in left femur area of surgery. PET 07-19-15 showed extensive bone involvement axial and proximal appendicular skeleton, "severe multifocal osseous metastatic disease, much greater than anticipated based on prior imaging", involved nodes right subpectoral and right internal mammary, and no distant extraosseous mets.  She had radiation to left femur 30 cGy completed 07-26-15. She continued IV bisphosphonate with zometa on 08-07-15 and had first taxotere herceptin perjeta on 08-09-15.   Objective:  Vital signs in last 24 hours:  BP (!) 95/54 (BP Location: Left Arm, Patient Position: Sitting)   Pulse 71   Temp 97.7 F (36.5 C) (Oral)   Resp 18   Ht _0  (1.702 m)   Wt 131 lb 14.4 oz (59.8 kg)   SpO2 100%   BMI 20.66 kg/m  Weight down 1 lb. Alert, oriented and appropriate. Ambulatory favoring LLE. Not in acute discomfort seated in exam room, tho holding emesis bag.  No alopecia  HEENT:PERRL, sclerae not icteric. Oral mucosa somewhat dry without lesions, posterior pharynx clear.  Neck supple. No JVD.  Lymphatics:no cervical,supraclavicular adenopathy Resp: clear to auscultation bilaterally  Cardio: regular rate and rhythm. No gallop. GI: soft, nontender, not distended, no mass or organomegaly.  Few bowel sounds.  Musculoskeletal/  Extremities: LE without pitting edema, cords. No point tenderness spine. No swelling UE Neuro: easy and appropriate signing communication with interpreter. Moves all extremities.  Skin without rash, ecchymosis, petechiae. Fingernails ok. Portacath- slight resolving ecchymosis, without erythema or unexpected tenderness. Good position. No surgical glue in place.   Patient seen again in infusiion area, receiving IVF with antiemetics including zofran etc, in bed. She is awake and alert, communicating easily with second sign language interpreter, requesting vanilla ice cream. Patient reports nausea improved.   Lab Results:  Results for orders placed or performed in visit on 08/12/15  CBC with Differential  Result Value Ref Range   WBC 9.1 3.9 - 10.3 10e3/uL   NEUT# 8.2 (H) 1.5 - 6.5 10e3/uL   HGB 11.3 (L) 11.6 - 15.9 g/dL   HCT 32.8 (L) 34.8 - 46.6 %   Platelets 137 (L) 145 - 400 10e3/uL   MCV 87.5 79.5 - 101.0 fL   MCH 30.1 25.1 - 34.0 pg   MCHC 34.5 31.5 - 36.0 g/dL   RBC 3.75 3.70 - 5.45 10e6/uL   RDW 13.2 11.2 - 14.5 %   lymph# 0.9 0.9 - 3.3 10e3/uL   MONO# 0.0 (L) 0.1 - 0.9 10e3/uL   Eosinophils Absolute 0.0 0.0 - 0.5 10e3/uL   Basophils Absolute 0.0 0.0 - 0.1 10e3/uL   NEUT% 90.1 (H) 38.4 - 76.8 %   LYMPH% 9.3 (L) 14.0 - 49.7 %  MONO% 0.2 0.0 - 14.0 %   EOS% 0.0 0.0 - 7.0 %   BASO% 0.4 0.0 - 2.0 %  Comprehensive metabolic panel  Result Value Ref Range   Sodium 141 136 - 145 mEq/L   Potassium 4.0 3.5 - 5.1 mEq/L   Chloride 102 98 - 109 mEq/L   CO2 30 (H) 22 - 29 mEq/L   Glucose 84 70 - 140 mg/dl   BUN 19.3 7.0 - 26.0 mg/dL   Creatinine 0.8 0.6 - 1.1 mg/dL   Total Bilirubin 1.23 (H) 0.20 - 1.20 mg/dL   Alkaline Phosphatase 126 40 - 150 U/L   AST 24 5 - 34 U/L   ALT 26 0 - 55 U/L   Total Protein 6.7 6.4 - 8.3 g/dL   Albumin 3.2 (L) 3.5 - 5.0 g/dL   Calcium 8.8 8.4 - 10.4 mg/dL   Anion Gap 8 3 - 11 mEq/L   EGFR >90 >90 ml/min/1.73 m2     Studies/Results:  No  results found.  Medications: I have reviewed the patient's current medications. See zofran discussion above. Per RN discussion with pharmacy, initial script for zofran was picked up, however patient does not believe she has that so will refill now for 8 mg every 8 hrs prn.  Patient given decadron 4 mg, zofran 8 mg IV, pepcid with 1 liter NS after first MD visit today, tolerated without difficulty, improved nausea. Prescription for hydrocodone APAP 5-325  1-2 q 4-6 hrs prn #20 given now. Patient instructed to take pain medication prior to coming for treatment, as beds are limited and she may need to be in recliner at times.  With present history, will avoid benadry., phenergan and related compazine, reglan.  DISCUSSION All of above interval history discussed with patient and interpreter. She agreed to try zofran now, tolerated without difficulty and helpful as above.  Patient instructed to call this office if she is unable to keep down liquids or if nausea not controlled prior to return for additional IVF on 08-14-15 (if needed).  Message to [facility] dietician . Message to [place] as patient expressed difficulty getting gas to come for necessary appointments.   Problem with location in infusion during treatment now defined as fact that she does not like people looking at her during treatment and LLE is not comfortable in recliner. Explained only 2 beds available, tho we are glad for her to use bed if possible. Suggested using pain medication as above also. Nurse manager aware and saw patient also today Concerns about scheduling noted; patient has also spoken with office manager. Appointments made by schedulers while patient at office today, reviewed with patient together with interpreter by RN, written copy given.   Communication is challenging and understandably patient has been overwhelmed.     Assessment/Plan:  1. Metastatic ER, HER 2 +  breast cancer extensively involving axial and appendicular  skeleton: history of T1N1 premenopausal right breast carcinoma ER/PR +, HER 2 + diagnosed Nov 1999, no HER 2 therapy in 1999. Post nailing of impending fracture left femur by Dr [name] 06-26-15, radiation to complete today. First Herceptin, Perjeta and taxotere given 08-09-15, with on pro neulasta, and will continue every 3 weeks. Counts somewhat lower but ok today, will repeat with my next visit 08-19-15. IVF with IV antiemetics today and on 08-15-15 if needed. Pain medication as noted. Continue zometa, due next ~ 09-05-15.  2. Echocardiogram 08-05-15 with EF 60-65% , prior adriamycin < 300 mg/m2, no chest radiation. Will  need to repeat echo q 3 months on HER 2 therapy 3.congenital deafness: needs sign language interpreter for all visits. Husband also deaf. 4.positional vertigo: followed by neurology. Note she does not tolerate lying on stomach due to vertigo, in case breast MRI considered 5.post laparoscopic cholecystectomy 2004 6.right TRAM with original breast cancer 1999 7.premature surgical menopause, post BSO 2005 for pelvic pain and ovarian cysts.7. 8..claustrophobia: required full sedation for the hip MRI. 9. Marked iron deficiency anemia: feraheme given 06-29-15 and 07-22-15.  10. IV access limited to LUE. PAC placed by IR 08-06-15 11. Reports pain medication lost on recent trip out of state. #20 hydrocodone rewritten now.  12.social concerns: transportation/ gas seems to be difficult, SW notified.  13.dehydration secondary to poor po intake, vomiting, loose stools. IVF now. Encouraged use of antiemetics prn and clear liquids. Dietician help requested by MD. BP a little lower prior to IVF, renal function and electrolytes ok.  All questions answered with assistance of interpreter. Patient understands discussion and instructions, and is in agreement with recommendations and  plans. Time spent 40 min including >50% counseling and coordination of care. Route PCP, cc Dr [name]   Evlyn Clines, MD    08/12/2015, 8:30 PM

## 2015-08-13 ENCOUNTER — Other Ambulatory Visit: Payer: Self-pay | Admitting: Oncology

## 2015-08-13 DIAGNOSIS — T451X5A Adverse effect of antineoplastic and immunosuppressive drugs, initial encounter: Secondary | ICD-10-CM

## 2015-08-13 DIAGNOSIS — Z889 Allergy status to unspecified drugs, medicaments and biological substances status: Secondary | ICD-10-CM | POA: Insufficient documentation

## 2015-08-13 DIAGNOSIS — G893 Neoplasm related pain (acute) (chronic): Secondary | ICD-10-CM | POA: Insufficient documentation

## 2015-08-13 DIAGNOSIS — C419 Malignant neoplasm of bone and articular cartilage, unspecified: Secondary | ICD-10-CM

## 2015-08-13 DIAGNOSIS — E639 Nutritional deficiency, unspecified: Secondary | ICD-10-CM

## 2015-08-13 DIAGNOSIS — C7951 Secondary malignant neoplasm of bone: Secondary | ICD-10-CM

## 2015-08-13 DIAGNOSIS — Z95828 Presence of other vascular implants and grafts: Secondary | ICD-10-CM | POA: Insufficient documentation

## 2015-08-13 DIAGNOSIS — R112 Nausea with vomiting, unspecified: Secondary | ICD-10-CM | POA: Insufficient documentation

## 2015-08-14 ENCOUNTER — Other Ambulatory Visit: Payer: Self-pay | Admitting: Oncology

## 2015-08-14 ENCOUNTER — Other Ambulatory Visit: Payer: Self-pay

## 2015-08-14 ENCOUNTER — Encounter: Payer: Self-pay | Admitting: Oncology

## 2015-08-14 ENCOUNTER — Ambulatory Visit (HOSPITAL_BASED_OUTPATIENT_CLINIC_OR_DEPARTMENT_OTHER): Payer: Medicaid Other

## 2015-08-14 DIAGNOSIS — E860 Dehydration: Secondary | ICD-10-CM | POA: Diagnosis not present

## 2015-08-14 DIAGNOSIS — C50919 Malignant neoplasm of unspecified site of unspecified female breast: Secondary | ICD-10-CM

## 2015-08-14 DIAGNOSIS — C7951 Secondary malignant neoplasm of bone: Principal | ICD-10-CM

## 2015-08-14 DIAGNOSIS — C50911 Malignant neoplasm of unspecified site of right female breast: Secondary | ICD-10-CM | POA: Diagnosis not present

## 2015-08-14 MED ORDER — HEPARIN SOD (PORK) LOCK FLUSH 100 UNIT/ML IV SOLN
500.0000 [IU] | Freq: Once | INTRAVENOUS | Status: AC
  Administered 2015-08-14: 500 [IU] via INTRAVENOUS
  Filled 2015-08-14: qty 5

## 2015-08-14 MED ORDER — SODIUM CHLORIDE 0.9% FLUSH
10.0000 mL | INTRAVENOUS | Status: DC | PRN
  Administered 2015-08-14: 10 mL via INTRAVENOUS
  Filled 2015-08-14: qty 10

## 2015-08-14 MED ORDER — FAMOTIDINE IN NACL 20-0.9 MG/50ML-% IV SOLN
20.0000 mg | Freq: Once | INTRAVENOUS | Status: AC
  Administered 2015-08-14: 20 mg via INTRAVENOUS

## 2015-08-14 MED ORDER — SODIUM CHLORIDE 0.9 % IV SOLN
Freq: Once | INTRAVENOUS | Status: AC | PRN
  Administered 2015-08-14: 16:00:00 via INTRAVENOUS
  Filled 2015-08-14: qty 4

## 2015-08-14 MED ORDER — SODIUM CHLORIDE 0.9 % IV SOLN
1000.0000 mL | Freq: Once | INTRAVENOUS | Status: AC
  Administered 2015-08-14: 1000 mL via INTRAVENOUS

## 2015-08-14 MED ORDER — FAMOTIDINE IN NACL 20-0.9 MG/50ML-% IV SOLN
INTRAVENOUS | Status: AC
  Filled 2015-08-14: qty 50

## 2015-08-14 NOTE — Patient Instructions (Signed)

## 2015-08-14 NOTE — Progress Notes (Unsigned)
Met with patient with interpreter and spouse present whom I received a staff message for about the grant. Initially introduced myself as her Estate manager/land agent in the lobby and gave my card and advised I would be speaking with her about her concern with gas cards. Patient states she didn't ask anyone about gas cards or assistance. Advised that I would meet her in the treatment area where it would be more private. Went to treatment area and re-introduced myself and explained my role, Advised that I would need proof of income to determine if she is eligible for the grant. Patient receives SSDI and spouse is employed. They are able to bring his bank statement on Fri 8/3. She states she cannot find her letter but she receives $54 a month. Gave them Lenise's name and asked interpreter to show her Lenise's office on the way out so that she will know where to bring the documentation. I also gave her a list of expenses that the grant may be used for other than gas cards. Patient verbalized understanding.

## 2015-08-14 NOTE — Progress Notes (Signed)
1510: Pt reports that she is having 10 out of 10 pain, pt takes Home medication (pain medication) at this time. Pt states she is concerned about becoming constipated with taking the pain medication. Pt educated that she can take stool softener daily and can take Miralax if she becomes constipated. Pt expresses understanding through sign language per Interrupter. Pt reports that she has been unbale to eat or drink much and has pain with swallowing, upon observation throat appears slightly red but no s/s of any mouth sores noticed. Louise RN aware who spoke with Dr. Marko Plume and then spoke with pt in infusion. Pt stable at this time.

## 2015-08-16 ENCOUNTER — Ambulatory Visit (HOSPITAL_BASED_OUTPATIENT_CLINIC_OR_DEPARTMENT_OTHER): Payer: Medicaid Other

## 2015-08-16 ENCOUNTER — Other Ambulatory Visit: Payer: Self-pay | Admitting: Oncology

## 2015-08-16 DIAGNOSIS — C7951 Secondary malignant neoplasm of bone: Secondary | ICD-10-CM | POA: Diagnosis not present

## 2015-08-16 DIAGNOSIS — E860 Dehydration: Secondary | ICD-10-CM | POA: Diagnosis not present

## 2015-08-16 DIAGNOSIS — C50919 Malignant neoplasm of unspecified site of unspecified female breast: Secondary | ICD-10-CM

## 2015-08-16 DIAGNOSIS — C50911 Malignant neoplasm of unspecified site of right female breast: Secondary | ICD-10-CM

## 2015-08-16 MED ORDER — HEPARIN SOD (PORK) LOCK FLUSH 100 UNIT/ML IV SOLN
500.0000 [IU] | Freq: Once | INTRAVENOUS | Status: AC
  Administered 2015-08-16: 500 [IU] via INTRAVENOUS
  Filled 2015-08-16: qty 5

## 2015-08-16 MED ORDER — SODIUM CHLORIDE 0.9 % IV SOLN
1000.0000 mL | Freq: Once | INTRAVENOUS | Status: AC
  Administered 2015-08-16: 1000 mL via INTRAVENOUS

## 2015-08-16 MED ORDER — SODIUM CHLORIDE 0.9 % IV SOLN
Freq: Once | INTRAVENOUS | Status: AC | PRN
  Administered 2015-08-16: 16:00:00 via INTRAVENOUS
  Filled 2015-08-16: qty 4

## 2015-08-16 MED ORDER — SODIUM CHLORIDE 0.9% FLUSH
10.0000 mL | Freq: Once | INTRAVENOUS | Status: AC
  Administered 2015-08-16: 10 mL via INTRAVENOUS
  Filled 2015-08-16: qty 10

## 2015-08-16 NOTE — H&P (Signed)
  [name], [name] Female, 50 y.o., November 23, 1965 Weight:  131 lb 14.4 oz (59.8 kg) Phone:  [phone] PCP:  [name], MD [place] MRN:  [id number] MyChart:  Active Next Appt:  08/19/2015  Message  Received: 2 days ago  Message Contents  Gordy Levan, MD  Baruch Merl, RN        Please send scripts for   carafate slurry 1 gm 4x daily for sore throat QS 10 days   Magic mouthwash without steroids  5-10cc swish swallow prn throat soreness ~ 1 pint      [name] declined the MMW and Carafate slurry.  She would not be able to take medication for the throat soreness. She was agreeable to trying chloraseptic spray and lozenges.

## 2015-08-16 NOTE — Progress Notes (Signed)
Pt feeling very nauseated and vomited  upon arrival . She states she has been nauseated for most of the week and has not been able to tolerate much oral intake. She is here for IVF's. Order available for IV zofran and this was given with relief of nausea.  Pt able to eat some soup and ice cream after her stomach settled down.  Reviewed pt's antiemetics she uses at home. Advised pt to use Zofran ATC and then use lorazepam as needed this weekend. Advised her to drink as much fluids as she can. Also advised her that if she was unable to keep fluids down and n/v persisted throughout the weekend despite oral antiemetics, that she should go to the ED. Pt states she does not like to go to ED so will take her meds as we discussed.  Pt is to see Dr. Marko Plume on Monday and note left for charge nurse that she may need IVFs after her MD appt. Pt aware of this also.

## 2015-08-16 NOTE — Patient Instructions (Signed)

## 2015-08-18 ENCOUNTER — Other Ambulatory Visit: Payer: Self-pay | Admitting: Oncology

## 2015-08-18 DIAGNOSIS — C7951 Secondary malignant neoplasm of bone: Principal | ICD-10-CM

## 2015-08-18 DIAGNOSIS — C50911 Malignant neoplasm of unspecified site of right female breast: Secondary | ICD-10-CM

## 2015-08-19 ENCOUNTER — Ambulatory Visit (HOSPITAL_BASED_OUTPATIENT_CLINIC_OR_DEPARTMENT_OTHER): Payer: Medicaid Other | Admitting: Oncology

## 2015-08-19 ENCOUNTER — Encounter: Payer: Self-pay | Admitting: Oncology

## 2015-08-19 ENCOUNTER — Other Ambulatory Visit (HOSPITAL_BASED_OUTPATIENT_CLINIC_OR_DEPARTMENT_OTHER): Payer: Medicaid Other

## 2015-08-19 ENCOUNTER — Ambulatory Visit (HOSPITAL_BASED_OUTPATIENT_CLINIC_OR_DEPARTMENT_OTHER): Payer: Medicaid Other

## 2015-08-19 ENCOUNTER — Telehealth: Payer: Self-pay | Admitting: Oncology

## 2015-08-19 ENCOUNTER — Ambulatory Visit: Payer: Medicaid Other | Admitting: Physical Therapy

## 2015-08-19 VITALS — BP 106/49 | HR 72 | Temp 97.6°F | Resp 18

## 2015-08-19 VITALS — BP 112/54 | HR 80 | Temp 97.9°F | Resp 18 | Ht 67.0 in | Wt 130.5 lb

## 2015-08-19 DIAGNOSIS — Z95828 Presence of other vascular implants and grafts: Secondary | ICD-10-CM

## 2015-08-19 DIAGNOSIS — C50911 Malignant neoplasm of unspecified site of right female breast: Secondary | ICD-10-CM

## 2015-08-19 DIAGNOSIS — C7951 Secondary malignant neoplasm of bone: Secondary | ICD-10-CM

## 2015-08-19 DIAGNOSIS — E860 Dehydration: Secondary | ICD-10-CM | POA: Diagnosis present

## 2015-08-19 DIAGNOSIS — G893 Neoplasm related pain (acute) (chronic): Secondary | ICD-10-CM

## 2015-08-19 DIAGNOSIS — D509 Iron deficiency anemia, unspecified: Secondary | ICD-10-CM | POA: Diagnosis not present

## 2015-08-19 DIAGNOSIS — R197 Diarrhea, unspecified: Secondary | ICD-10-CM

## 2015-08-19 DIAGNOSIS — H905 Unspecified sensorineural hearing loss: Secondary | ICD-10-CM

## 2015-08-19 LAB — COMPREHENSIVE METABOLIC PANEL
ALT: 18 U/L (ref 0–55)
ANION GAP: 12 meq/L — AB (ref 3–11)
AST: 14 U/L (ref 5–34)
Albumin: 3.2 g/dL — ABNORMAL LOW (ref 3.5–5.0)
Alkaline Phosphatase: 112 U/L (ref 40–150)
BUN: 8.3 mg/dL (ref 7.0–26.0)
CHLORIDE: 103 meq/L (ref 98–109)
CO2: 26 meq/L (ref 22–29)
CREATININE: 0.8 mg/dL (ref 0.6–1.1)
Calcium: 9.5 mg/dL (ref 8.4–10.4)
EGFR: 85 mL/min/{1.73_m2} — AB (ref >90)
Glucose: 104 mg/dl (ref 70–140)
POTASSIUM: 4.3 meq/L (ref 3.5–5.1)
Sodium: 141 mEq/L (ref 136–145)
Total Bilirubin: 0.66 mg/dL (ref 0.20–1.20)
Total Protein: 6.4 g/dL (ref 6.4–8.3)

## 2015-08-19 LAB — CBC WITH DIFFERENTIAL/PLATELET
BASO%: 0.3 % (ref 0.0–2.0)
Basophils Absolute: 0.1 10*3/uL (ref 0.0–0.1)
EOS%: 0 % (ref 0.0–7.0)
Eosinophils Absolute: 0 10*3/uL (ref 0.0–0.5)
HCT: 34.3 % — ABNORMAL LOW (ref 34.8–46.6)
HGB: 11.3 g/dL — ABNORMAL LOW (ref 11.6–15.9)
LYMPH%: 10.2 % — AB (ref 14.0–49.7)
MCH: 29.7 pg (ref 25.1–34.0)
MCHC: 32.9 g/dL (ref 31.5–36.0)
MCV: 90.3 fL (ref 79.5–101.0)
MONO#: 0.5 10*3/uL (ref 0.1–0.9)
MONO%: 2.6 % (ref 0.0–14.0)
NEUT#: 15.5 10*3/uL — ABNORMAL HIGH (ref 1.5–6.5)
NEUT%: 86.9 % — AB (ref 38.4–76.8)
PLATELETS: 134 10*3/uL — AB (ref 145–400)
RBC: 3.8 10*6/uL (ref 3.70–5.45)
RDW: 14.4 % (ref 11.2–14.5)
WBC: 17.9 10*3/uL — ABNORMAL HIGH (ref 3.9–10.3)
lymph#: 1.8 10*3/uL (ref 0.9–3.3)

## 2015-08-19 MED ORDER — HEPARIN SOD (PORK) LOCK FLUSH 100 UNIT/ML IV SOLN
500.0000 [IU] | Freq: Once | INTRAVENOUS | Status: AC | PRN
  Administered 2015-08-19: 500 [IU]
  Filled 2015-08-19: qty 5

## 2015-08-19 MED ORDER — SODIUM CHLORIDE 0.9 % IV SOLN
Freq: Once | INTRAVENOUS | Status: AC
  Administered 2015-08-19: 17:00:00 via INTRAVENOUS
  Filled 2015-08-19: qty 4

## 2015-08-19 MED ORDER — FAMOTIDINE IN NACL 20-0.9 MG/50ML-% IV SOLN
20.0000 mg | Freq: Once | INTRAVENOUS | Status: AC
  Administered 2015-08-19: 20 mg via INTRAVENOUS

## 2015-08-19 MED ORDER — FAMOTIDINE IN NACL 20-0.9 MG/50ML-% IV SOLN
INTRAVENOUS | Status: AC
  Filled 2015-08-19: qty 50

## 2015-08-19 MED ORDER — HYDROCODONE-ACETAMINOPHEN 7.5-325 MG PO TABS: ORAL_TABLET | ORAL | 0 refills | Status: DC

## 2015-08-19 MED ORDER — SODIUM CHLORIDE 0.9% FLUSH
10.0000 mL | INTRAVENOUS | Status: DC | PRN
  Administered 2015-08-19: 10 mL
  Filled 2015-08-19: qty 10

## 2015-08-19 MED ORDER — FAMOTIDINE 20 MG PO TABS: 20.0000 mg | ORAL_TABLET | Freq: Two times a day (BID) | ORAL | 0 refills | Status: DC

## 2015-08-19 MED ORDER — SODIUM CHLORIDE 0.9 % IV SOLN
INTRAVENOUS | Status: DC
  Administered 2015-08-19: 17:00:00 via INTRAVENOUS

## 2015-08-19 NOTE — Patient Instructions (Signed)

## 2015-08-19 NOTE — Telephone Encounter (Signed)
Added zometa per 8/6 pof, pt will p/u updated sched in tx room

## 2015-08-19 NOTE — Progress Notes (Signed)
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 Metastatic disease was documented 06-2015, 17.5 years after initial diagnosis.  She had pinning of impending fracture left femur 06-26-15 and local radiation. She had first taxotere herceptin perjeta on 08-09-15, with neulasta. She had zometa on 08-07-15.  She needed IVF on 7-31,  8-2 and 08-16-15.  Patient continues with symptoms related to the metastatic cancer and to treatment. She seems to have used more zofran and ativan and to have pushed po fluids more over last 2 days as instructed. She has been able to eat mac and cheese, likes nondairy chocolate milk, can drink Cheerwine and some water. She reports diarrhea 2-3x daily for last few days. She had epistaxis on 08-18-15, stopped without intervention, no other bleeding and not prone to nosebleeds in past. She takes hydrocodone 5-325 before bed and is then able to sleep until 2-3 AM when she awakens with pain and cannot go back to sleep. She is reluctant to use pain medication during day, does notice some discomfort upper back and complains of ongoing stiffness in left hip area. She does not like to use cane/ walker. She has not fallen. She denies fever, cough or increased SOB, is voiding. No numbness LE or UE. No problems with PAC. Sore throat 08-16-15 improved with chloroseptic, as patient refused carafate slurry or MMW. No oral mucositis.  Remainder of 10 point Review of Systems negative / unchanged.     PAC by IR 08-06-15 Feraheme 06-29-15 and 07-22-15 Genetics testing negative by Breast Ovarian panel 03-2015 [facility] 03-15-15 heterogeneously dense tissue otherwise  negative. CA [id number] on 06-26-15: 72.5    ONCOLOGIC HISTORY Patient had 1.4 cm poorly differentiated right breast carcinoma diagnosed in Nov 1999 at age 50, 1/7 axillary nodes involved, ER/PR and HER 2 positive. She had mastectomy with the 7 axillary node evaluation, 4 cycles of adriamycin/ cytoxan followed by taxotere, then five years of tamoxifen thru June 2005 (no herceptin in 1999). She did not have local radiation. She had right TRAM reconstruction. She had bilateral salpingoophorectomy in May 2005. She was briefly on aromatase inhibitor after tamoxifen, but discontinued this herself due to poor tolerance. Most recent left mammogram was done as screening on 04-15-2012 with heterogeneously dense tissue and questioned asymmetry, followed by diagnostic left mamogram on 04-28-12 which had no findings of concern. 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 She had nailing of impending left femur fracture by Dr [name] on 06-26-15. Pathology [phone] from 06-26-15 showed metastatic adenocarcinoma , strongly ER positive 95%, PR positive 2%, HER 2 positive 3+. Plain xrays suggested T1 and left 4th rib involvement, tho these also did not show on bone scan 07-01-15; like unremarkable CT, bone scan showed only activity in left femur area of surgery. PET 07-19-15 showed extensive bone involvement axial and proximal appendicular skeleton, "severe multifocal osseous metastatic disease, much greater than anticipated based on prior imaging", involved nodes right subpectoral and right internal mammary, and no distant extraosseous mets.  She had radiation to left femur 30 cGy completed 07-26-15. She continued IV bisphosphonate with zometa on 08-07-15 and had first  taxotere herceptin perjeta on 08-09-15.     Objective:  Vital signs in last 24 hours:  BP (!) 112/54 (BP Location: Left Arm, Patient Position:  Sitting)   Pulse 80   Temp 97.9 F (36.6 C) (Oral)   Resp 18   Ht '5\' 7"'$  (1.702 m)   Wt 130 lb 8 oz (59.2 kg)   SpO2 100%   BMI 20.44 kg/m  Weight down 1 lb. Does not look well, but not in acute distress. Alert, oriented and appropriate. Ambulatory favoring LLE, able to get onto exam table with assistance   HEENT:PERRL, sclerae not icteric. Oral mucosa not markedly dry, without lesions, posterior pharynx clear. Nasal septum raw especially on left, no active bleeding. No JVD.  Lymphatics:no cervical,supraclavicular, axillary adenopathy Resp: clear to auscultation bilaterally and normal percussion bilaterally Cardio: regular rate and rhythm. No gallop. GI: soft, nontender, not distended, no mass or organomegaly. Normally active bowel sounds.  Musculoskeletal/ Extremities: without pitting edema, cords Neuro: Congenital deafness  Otherwise nonfocal.  PSYCH mood and affect seem appropriate Skin without rash, ecchymosis, petechiae Portacath-without erythema or tenderness  Lab Results:  Results for orders placed or performed in visit on 08/19/15  CBC with Differential  Result Value Ref Range   WBC 17.9 (H) 3.9 - 10.3 10e3/uL   NEUT# 15.5 (H) 1.5 - 6.5 10e3/uL   HGB 11.3 (L) 11.6 - 15.9 g/dL   HCT 34.3 (L) 34.8 - 46.6 %   Platelets 134 (L) 145 - 400 10e3/uL   MCV 90.3 79.5 - 101.0 fL   MCH 29.7 25.1 - 34.0 pg   MCHC 32.9 31.5 - 36.0 g/dL   RBC 3.80 3.70 - 5.45 10e6/uL   RDW 14.4 11.2 - 14.5 %   lymph# 1.8 0.9 - 3.3 10e3/uL   MONO# 0.5 0.1 - 0.9 10e3/uL   Eosinophils Absolute 0.0 0.0 - 0.5 10e3/uL   Basophils Absolute 0.1 0.0 - 0.1 10e3/uL   NEUT% 86.9 (H) 38.4 - 76.8 %   LYMPH% 10.2 (L) 14.0 - 49.7 %   MONO% 2.6 0.0 - 14.0 %   EOS% 0.0 0.0 - 7.0 %   BASO% 0.3 0.0 - 2.0 %  Comprehensive metabolic panel  Result Value Ref Range   Sodium 141 136 - 145 mEq/L   Potassium 4.3 3.5 - 5.1 mEq/L   Chloride 103 98 - 109 mEq/L   CO2 26 22 - 29 mEq/L   Glucose 104 70 - 140 mg/dl   BUN  8.3 7.0 - 26.0 mg/dL   Creatinine 0.8 0.6 - 1.1 mg/dL   Total Bilirubin 0.66 0.20 - 1.20 mg/dL   Alkaline Phosphatase 112 40 - 150 U/L   AST 14 5 - 34 U/L   ALT 18 0 - 55 U/L   Total Protein 6.4 6.4 - 8.3 g/dL   Albumin 3.2 (L) 3.5 - 5.0 g/dL   Calcium 9.5 8.4 - 10.4 mg/dL   Anion Gap 12 (H) 3 - 11 mEq/L   EGFR 85 (L) >90 ml/min/1.73 m2   WBC up from neulasta, other counts still ok.  Studies/Results:  No results found.  Medications: I have reviewed the patient's current medications. Increase hydrocodone to 7.5/325, patient instructed to repeat dose during night if unable to sleep due to pain.  She is given IVF following visit today.   NOTE she is more comfortable in infusion bed than recliner due to left hip  DISCUSSION Interval history reviewed with assistance of sign language interpreter, including medications as above. Asked  patient to try mixing regular carnation instant into her chocolate milk, encouraged >=2x daily. Reviewed need for good nutrition to assist in improving this siuation and in tolerating treatment.  OK to use imodium for apparent herceptin/ perjeta diarrhea.  Recommended increasing humidity and using vaseline applied using little finger to nasal septum . Do not dislodge clots if bleeding. Note platelets 134k today She has follow up with Dr [name] upcoming and will let him know that there were insurance issues such that she has not had PT. She does have extensive bone metastases, so that any PT needs to be very carefully done.   Assessment/Plan: 1. Metastatic ER, HER 2 + breast cancer extensively involving axial and appendicular skeleton: history of T1N1 right breast carcinoma age 39, ER/PR +, HER 2 + diagnosed Nov 1999, no HER 2 therapy in 1999. Post nailing of impending fracture left femur by Dr [name] 6-14-17and  radiation. First Herceptin, Perjeta and taxotere given 08-09-15, with on pro neulasta, and will continue every 3 weeks. Counts somewhat lower but ok  today, repeat with MD visit 78-17-17.  Pain medication as noted. Continue zometa, due next ~ 09-05-15.  2. Echocardiogram 08-05-15 with EF 60-65% , prior adriamycin < 300 mg/m2, no chest radiation. 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 Husband also deaf. All communication needs to be very clear 4.positional vertigo: followed by neurology. Note she does not tolerate lying on stomach due to vertigo, in case breast MRI considered 5.post laparoscopic cholecystectomy 2004 6.right TRAM with original breast cancer 1999 7.premature surgical menopause, post BSO 2032fr pelvic pain and ovarian cysts.7. 8..claustrophobia: required full sedation for the hip MRI. 9. Marked iron deficiency anemia: feraheme given 06-29-15 and 07-22-15.  10. PAC placed by IR 08-06-15. Peripheral IV access limited to LUE. 11.NO intolerance to zofran.  12.diarrhea likely from herceptin/ perjeta: IVF today, push po fluids, imodium if needed. 13.poor nutritional status and poor po intake: hopefully she will add carnation instant to chocolate milk drink. [facility]15.  14.has been given information on advance directives.   All questions answered. IVF orders placed. Time spent 30 min including >50% counseling and coordination of care. Appreciate collaboration with sign language interpreters. Route PCP, cc Dr YLorin Mercy LEvlyn Clines MD   08/19/2015, 9:26 PM

## 2015-08-23 ENCOUNTER — Telehealth: Payer: Self-pay

## 2015-08-23 NOTE — Telephone Encounter (Signed)
[name] stated that she is not experiencing any hot flashes. Told her the information noted below by Dr. Marko Plume. She stated that she would call Monday if she had any concerns.

## 2015-08-23 NOTE — Telephone Encounter (Signed)
[name] callled stating that her nose and cheeks are flushed. Afebrile. No sweling or itching noted. No difficulty breathing. No change in home care products. She began the Hydrocodone APAP 7.5/ 325 mg Tuesday night.  She using 1 tablet at HS.  She states that she does not have any pain during the day. She noticed a flushed  face yesterday but her family says her face is becoming more red.  Told [name] that this  Information will be reviewed with Dr. Marko Plume and will call her back with Dr. Mariana Kaufman recommendations.

## 2015-08-23 NOTE — Telephone Encounter (Signed)
  [name], [name] Female, 50 y.o., November 16, 1965 Weight:  130 lb 8 oz (59.2 kg) Phone:  [phone] PCP:  [name], MD [place] MRN:  [id number] MyChart:  Active Next Appt:  08/29/2015 Message  Received: Today  Message Contents  Gordy Levan, MD  Baruch Merl, RN        Do not thing the slightly higher dose of same medicine hydrocodone would cause face to flush.  Is she having hot flashes?  Maybe her red blood cells are getting better so her color is pinker -   Since no itching, swelling, SOB or other symptoms, I do not think she needs to do anything differently now. If those problems happen over weekend, she can call or can be seen at ED. She can let us know on Monday 8-14 if still concerns; I am to see her 8-17.   thanks

## 2015-08-29 ENCOUNTER — Telehealth: Payer: Self-pay | Admitting: *Deleted

## 2015-08-29 ENCOUNTER — Ambulatory Visit: Payer: Medicaid Other | Admitting: Nutrition

## 2015-08-29 ENCOUNTER — Ambulatory Visit (HOSPITAL_BASED_OUTPATIENT_CLINIC_OR_DEPARTMENT_OTHER): Payer: Medicaid Other | Admitting: Oncology

## 2015-08-29 ENCOUNTER — Other Ambulatory Visit (HOSPITAL_BASED_OUTPATIENT_CLINIC_OR_DEPARTMENT_OTHER): Payer: Medicaid Other

## 2015-08-29 ENCOUNTER — Other Ambulatory Visit: Payer: Self-pay | Admitting: Oncology

## 2015-08-29 ENCOUNTER — Other Ambulatory Visit: Payer: Self-pay

## 2015-08-29 ENCOUNTER — Ambulatory Visit (HOSPITAL_BASED_OUTPATIENT_CLINIC_OR_DEPARTMENT_OTHER): Payer: Medicaid Other

## 2015-08-29 ENCOUNTER — Encounter: Payer: Self-pay | Admitting: Oncology

## 2015-08-29 ENCOUNTER — Telehealth: Payer: Self-pay | Admitting: Oncology

## 2015-08-29 VITALS — BP 105/58 | HR 88 | Temp 97.9°F | Resp 18 | Wt 131.6 lb

## 2015-08-29 DIAGNOSIS — C50911 Malignant neoplasm of unspecified site of right female breast: Secondary | ICD-10-CM

## 2015-08-29 DIAGNOSIS — E876 Hypokalemia: Secondary | ICD-10-CM | POA: Diagnosis not present

## 2015-08-29 DIAGNOSIS — D509 Iron deficiency anemia, unspecified: Secondary | ICD-10-CM | POA: Diagnosis not present

## 2015-08-29 DIAGNOSIS — R197 Diarrhea, unspecified: Secondary | ICD-10-CM

## 2015-08-29 DIAGNOSIS — R110 Nausea: Secondary | ICD-10-CM

## 2015-08-29 DIAGNOSIS — C7951 Secondary malignant neoplasm of bone: Secondary | ICD-10-CM | POA: Diagnosis not present

## 2015-08-29 DIAGNOSIS — C50919 Malignant neoplasm of unspecified site of unspecified female breast: Secondary | ICD-10-CM

## 2015-08-29 DIAGNOSIS — H905 Unspecified sensorineural hearing loss: Secondary | ICD-10-CM

## 2015-08-29 DIAGNOSIS — G893 Neoplasm related pain (acute) (chronic): Secondary | ICD-10-CM

## 2015-08-29 DIAGNOSIS — K219 Gastro-esophageal reflux disease without esophagitis: Secondary | ICD-10-CM | POA: Diagnosis not present

## 2015-08-29 DIAGNOSIS — Z95828 Presence of other vascular implants and grafts: Secondary | ICD-10-CM

## 2015-08-29 DIAGNOSIS — E639 Nutritional deficiency, unspecified: Secondary | ICD-10-CM

## 2015-08-29 LAB — CBC WITH DIFFERENTIAL/PLATELET
BASO%: 0.5 % (ref 0.0–2.0)
BASOS ABS: 0 10*3/uL (ref 0.0–0.1)
EOS ABS: 0 10*3/uL (ref 0.0–0.5)
EOS%: 0.2 % (ref 0.0–7.0)
HEMATOCRIT: 28.3 % — AB (ref 34.8–46.6)
HEMOGLOBIN: 9.6 g/dL — AB (ref 11.6–15.9)
LYMPH#: 0.9 10*3/uL (ref 0.9–3.3)
LYMPH%: 17.7 % (ref 14.0–49.7)
MCH: 29.8 pg (ref 25.1–34.0)
MCHC: 33.8 g/dL (ref 31.5–36.0)
MCV: 88.1 fL (ref 79.5–101.0)
MONO#: 0.4 10*3/uL (ref 0.1–0.9)
MONO%: 8 % (ref 0.0–14.0)
NEUT#: 3.8 10*3/uL (ref 1.5–6.5)
NEUT%: 73.6 % (ref 38.4–76.8)
Platelets: 248 10*3/uL (ref 145–400)
RBC: 3.21 10*6/uL — ABNORMAL LOW (ref 3.70–5.45)
RDW: 15.6 % — ABNORMAL HIGH (ref 11.2–14.5)
WBC: 5.2 10*3/uL (ref 3.9–10.3)

## 2015-08-29 LAB — COMPREHENSIVE METABOLIC PANEL
ALT: 23 U/L (ref 0–55)
AST: 11 U/L (ref 5–34)
Albumin: 2.9 g/dL — ABNORMAL LOW (ref 3.5–5.0)
Alkaline Phosphatase: 85 U/L (ref 40–150)
Anion Gap: 10 mEq/L (ref 3–11)
BILIRUBIN TOTAL: 0.94 mg/dL (ref 0.20–1.20)
BUN: 9.5 mg/dL (ref 7.0–26.0)
CHLORIDE: 108 meq/L (ref 98–109)
CO2: 24 meq/L (ref 22–29)
CREATININE: 0.7 mg/dL (ref 0.6–1.1)
Calcium: 9 mg/dL (ref 8.4–10.4)
EGFR: >90 mL/min/{1.73_m2} (ref >90)
GLUCOSE: 96 mg/dL (ref 70–140)
Potassium: 3.1 mEq/L — ABNORMAL LOW (ref 3.5–5.1)
SODIUM: 142 meq/L (ref 136–145)
TOTAL PROTEIN: 6.1 g/dL — AB (ref 6.4–8.3)

## 2015-08-29 MED ORDER — DEXAMETHASONE 4 MG PO TABS: ORAL_TABLET | ORAL | 1 refills | Status: DC

## 2015-08-29 MED ORDER — LORAZEPAM 0.5 MG PO TABS: ORAL_TABLET | ORAL | 0 refills | Status: DC

## 2015-08-29 MED ORDER — HEPARIN SOD (PORK) LOCK FLUSH 100 UNIT/ML IV SOLN
500.0000 [IU] | Freq: Once | INTRAVENOUS | Status: AC | PRN
  Administered 2015-08-29: 500 [IU]
  Filled 2015-08-29: qty 5

## 2015-08-29 MED ORDER — SODIUM CHLORIDE 0.9% FLUSH
10.0000 mL | INTRAVENOUS | Status: DC | PRN
  Administered 2015-08-29: 10 mL
  Filled 2015-08-29: qty 10

## 2015-08-29 MED ORDER — HEPARIN SOD (PORK) LOCK FLUSH 100 UNIT/ML IV SOLN
250.0000 [IU] | Freq: Once | INTRAVENOUS | Status: DC | PRN
  Filled 2015-08-29: qty 5

## 2015-08-29 MED ORDER — ONDANSETRON HCL 8 MG PO TABS: 8.0000 mg | ORAL_TABLET | Freq: Three times a day (TID) | ORAL | 0 refills | Status: DC | PRN

## 2015-08-29 NOTE — Telephone Encounter (Signed)
appt made and avs printed °

## 2015-08-29 NOTE — Progress Notes (Signed)
50 year old female diagnosed with metastatic breast cancer.  She is a patient of Dr. Marko Plume. Patient was seen with her husband and interpreter.  Past medical history includes depression, deafness, and anxiety.  Medications include Decadron, Ativan, Zofran, and Senokot-S.  Labs include albumin 3.2.  Height: 67 inches. Weight: 131.6 pounds August 17. Usual body weight: 140 pounds February 2017. BMI: 20.61.  Patient reports she has had nausea and vomiting for quite some time despite taking nausea medication. She has a metallic taste when she eats. She complains of diarrhea. Patient is particular about food choices but will drink milk.  She has not yet tried [employer] but agrees that she will today.  Nutrition diagnosis:  Unintended weight loss related to inadequate oral intake as evidenced by 6 percent weight loss from usual body weight.  Intervention: Educated patient on strategies for increasing calories and protein in small frequent meals and snacks. Educated patient on strategies for eating with nausea and vomiting and provided fact sheet Reviewed foods to avoid and foods to include, during episodes of diarrhea and included fact sheet. Educated patient on ways to enhance taste alterations and provided fact sheet. Encouraged patient to try Carnation breakfast essentials and provided coupons for patient. Questions were answered.  Teach back method used.  Contact information was given.  Monitoring, evaluation, goals:  Patient will tolerate increased calories and protein to minimize further weight loss.  Next visit: Patient contact me for questions or concerns.  **Disclaimer: This note was dictated with voice recognition software. Similar sounding words can inadvertently be transcribed and this note may contain transcription errors which may not have been corrected upon publication of note.**

## 2015-08-29 NOTE — Telephone Encounter (Signed)
Per desk RN I have scheduled ivf appts. Desk RN to place LOS

## 2015-08-29 NOTE — Progress Notes (Unsigned)
Met with patient and spouse along with interpreter in my office. Communicated that she was approved for the one-time [employer]. Obtained signature and provided her with a copy of the grant as well as a list of expenses it covers. Patient asked questions about how to submit bills. Explained to her that the statements can be brought in to myself or Len[place] if I'm not in. She also wanted to know how the car repair process works being that they have a vehicle that is not working right now . Advised her of this process and gave her the name, number, and address to [employer] by [employer] which we deal with and are familiar with our grant process. They were very appreciative. Gave my name and number for any additional financial questions or concerns. Asked if she needed anything from the grant funds today and she stated not today. She asked about an ambulance bill that she received which she said was a short ride and over $400. Advised her that the grant cannot be used to pay for medical bills only the expenses listed on the top of the sheet. She said OK but wasn't sure how she was going to pay it. Patient, spouse, and interpreter thanked me. I signed off on interpreter log sheet.

## 2015-08-29 NOTE — Progress Notes (Signed)
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 Patient had rodding of impending left femur fracture 06-26-15, local radiation, first taxotere herceptin perjeta on 08-09-15 with on pro neulasat, and most recent IV bisphosphonate on 08-07-15.   She is to see Dr Sondra Come on 8-24  She had a difficult time with cycle 1 chemo, requiring additional IVF on 7-31, 8-2 and 08-16-15, in part due to misinformation concerning tolerance of antiemetics. Cycle 2 chemo planned 08-30-15 will use EMEND and zofran in addition to decadron.   Patient reports that she has felt quite a bit better over last several days, with better appetite and less pain. She has been able to eat lasagna, corn dogs, pancakes, chocolate milk substitute and soda, without nausea or vomiting in several days. She has not tried [employer], discussed again. She is using hydrocodone 7.5-325 at hs, sleeps 4-5 hours, then awakens with soreness in left hip (tends to sleep lying on left side) and is unable to go back to sleep even if she takes another of the hydrocodone then. She has not needed pain medication thru day and has been more active at home, tho still feels stiffness and heaviness in left leg. She believes that she missed appointment with Dr [name], note some insurance problem and she was unable to do any outpatient PT following surgery (which she was to discuss with Dr [name] at that visit). She has no other severe or different pain, including neck or UE. She complains of ongoing diarrhea several times daily/ not at night, no abdominal cramping or fever, no bleeding, not clear if she has used more of imodium;  discussed stool for C diff, collection apparatus given. Sore throat has resolved. She still has GERD, picked up but did not begin Pepcid for this problems, understands that she should start that.  No bleeding, no fever, bladder ok, no SOB or cough, no problems with PAC, no swelling LE.  Remainder of 10 point Review of Systems negative/ unchanged.   PAC by IR 08-06-15 Feraheme 06-29-15 and 07-22-15 Genetics testing negative by Breast Ovarian panel 03-2015 [facility] 03-15-15 heterogeneously dense tissue otherwise negative. CA 2729 on 06-26-15: 72.5  ONCOLOGIC HISTORY Patient had 1.4 cm poorly differentiated right breast carcinoma diagnosed in Nov 1999 at age 50, 1/7 axillary nodes involved, ER/PR and HER 2 positive. She had mastectomy with the 7 axillary node evaluation, 4 cycles of adriamycin/ cytoxan followed by taxotere, then five years of tamoxifen thru June 2005 (no herceptin in 1999). She did not have local radiation. She had right TRAM reconstruction. She had bilateral salpingoophorectomy in May 2005. She was briefly on aromatase inhibitor after tamoxifen, but discontinued this herself due to poor tolerance. Most recent left mammogram was done as screening on 04-15-2012 with heterogeneously dense tissue and questioned asymmetry, followed by diagnostic left mamogram on 04-28-12 which had no findings of concern. 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 She had nailing of impending left femur fracture by Dr [name] on 06-26-15. Pathology [id number] from 06-26-15 showed metastatic adenocarcinoma , strongly ER positive 95%, PR positive 2%, HER  2 positive 3+. Plain xrays suggested T1 and left 4th rib involvement, tho these also did not show on bone scan 07-01-15; like unremarkable CT, bone scan showed only activity in left femur area of surgery. PET 07-19-15 showed extensive  bone involvement axial and proximal appendicular skeleton, "severe multifocal osseous metastatic disease, much greater than anticipated based on prior imaging", involved nodes right subpectoral and right internal mammary, and no distant extraosseous mets.  She had radiation to left femur 30 cGy completed 07-26-15. She continued IV bisphosphonate with zometa on 08-07-15 and had first taxotere herceptin perjeta on 08-09-15.   Objective:  Vital signs in last 24 hours:  BP (!) 105/58 (BP Location: Left Arm, Patient Position: Sitting)   Pulse 88   Temp 97.9 F (36.6 C) (Oral)   Resp 18   Wt 131 lb 9.6 oz (59.7 kg)   SpO2 100%   BMI 20.61 kg/m  Weight up 1 lb. Overall looks obviously better today. Alert, oriented and appropriate. Ambulatory with some difficulty favoring left leg, but moving more easily and quickly overall, and able to get on and off exam table. Respirations not labored.  Alopecia  HEENT:PERRL, sclerae not icteric. Oral mucosa moist without lesions, posterior pharynx clear.  Neck supple. No JVD.  Lymphatics:no cervical,supraclavicular, axillary adenopathy Resp: clear to auscultation bilaterally and normal percussion bilaterally Cardio: regular rate and rhythm. No gallop. GI: soft, nontender, not distended, no mass or organomegaly. Normally active bowel sounds.  Musculoskeletal/ Extremities: UE/ LE without pitting edema, cords, tenderness Neuro: Deafness. no peripheral neuropathy. Otherwise nonfocal   PSYCH: brighter mood and affect Skin without rash, ecchymosis, petechiae Breasts: exam not repeated with interpreter present Portacath-without erythema or tenderness  Lab Results:  Results for orders placed or performed in visit on 08/29/15  Comprehensive metabolic panel  Result Value Ref Range   Sodium 142 136 - 145 mEq/L   Potassium 3.1 (L) 3.5 - 5.1 mEq/L   Chloride 108 98 - 109 mEq/L   CO2 24 22 - 29 mEq/L   Glucose 96 70 - 140 mg/dl   BUN 9.5 7.0 - 26.0 mg/dL    Creatinine 0.7 0.6 - 1.1 mg/dL   Total Bilirubin 0.94 0.20 - 1.20 mg/dL   Alkaline Phosphatase 85 40 - 150 U/L   AST 11 5 - 34 U/L   ALT 23 0 - 55 U/L   Total Protein 6.1 (L) 6.4 - 8.3 g/dL   Albumin 2.9 (L) 3.5 - 5.0 g/dL   Calcium 9.0 8.4 - 10.4 mg/dL   Anion Gap 10 3 - 11 mEq/L   EGFR >90 >90 ml/min/1.73 m2  CBC with Differential/Platelet  Result Value Ref Range   WBC 5.2 3.9 - 10.3 10e3/uL   NEUT# 3.8 1.5 - 6.5 10e3/uL   HGB 9.6 (L) 11.6 - 15.9 g/dL   HCT 28.3 (L) 34.8 - 46.6 %   Platelets 248 145 - 400 10e3/uL   MCV 88.1 79.5 - 101.0 fL   MCH 29.8 25.1 - 34.0 pg   MCHC 33.8 31.5 - 36.0 g/dL   RBC 3.21 (L) 3.70 - 5.45 10e6/uL   RDW 15.6 (H) 11.2 - 14.5 %   lymph# 0.9 0.9 - 3.3 10e3/uL   MONO# 0.4 0.1 - 0.9 10e3/uL   Eosinophils Absolute 0.0 0.0 - 0.5 10e3/uL   Basophils Absolute 0.0 0.0 - 0.1 10e3/uL   NEUT% 73.6 38.4 - 76.8 %   LYMPH% 17.7 14.0 - 49.7 %   MONO% 8.0 0.0 - 14.0 %  EOS% 0.2 0.0 - 7.0 %   BASO% 0.5 0.0 - 2.0 %    CA 2729 available after visit 109.8, this having been 72.5 on 06-26-15 (first chemo 08-09-15) Studies/Results:  No results found.  Medications: I have reviewed the patient's current medications.  CMET resulted aftrer visit: Potassium to be added to IVF with chemo and will begin KCl 10 mEq two tablets daily.  Begin pepcid now and daily Discussed decadron 8 mg bid x 3 days beginning today. As she has not gotten this prescription yet, she will take one dose of the decadron today, then as instructed. She understands that she should use antiemetics after chemo if any nausea. She will try increasing hydrocodone APAP 7.5-325 to 2 tablets at hs and can repeat 2 during night if needed.   Written instructions in addition to oral/ sign language instrudtions for pepcid, hydrocodone and decadron. Will instruct on KCl at chemo on 8-19 with sign language interpreter's assistance then.  DISCUSSION All of interval history reviewed as above, medication  instructions as above  Etiology of diarrhea not clear, now 3 weeks out from taxotere/ herceptin/ perjeta. Will send stool for C diff, tho no recent antibiotics. This is likely contributing to hypokalemia  Patient agrees to IVF on 8-19, this set up due to problems after cycle 1 (when she also was not using antiemetics that we subsequently confirmed she is able to use).   Importance of good nutrition stressed again; [facility] nutritionist met with her after MD today.   WIll need to schedule back to Dr [name] if that apt missed.     Assessment/Plan:  1. Metastatic ER+, HER 2 + breast cancer extensively involving axial and appendicular skeleton: history of T1N1 right breast carcinoma age 30, ER/PR +, HER 2 + in Nov 1999, no HER 2 therapy in 1999. Post nailing of impending fracture left femur by Dr [name] 6-14-17and  radiation. FirstHerceptin, Perjeta and taxotere given 08-09-15, with on pro neulasta; cycle 2 due 08-30-15.  Continue zometa, due next ~ 09-05-15 and will coordinate with MD visit. Additional IVF 8-19 and following week if needed.  I will see her with zometa and extra IVF on 8-24 2. Echocardiogram 08-05-15 with EF 60-65% , prior adriamycin < 300 mg/m2, no chest radiation. 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 Husband also deaf. All communication needs to be very clear 4.positional vertigo: followed by neurology. Note she does not tolerate lying on stomach due to vertigo, in case breast MRI considered 5.post laparoscopic cholecystectomy 2004 6.right TRAM with original breast cancer 1999 7.premature surgical menopause, post BSO [id number] pelvic pain and ovarian cysts.7. 8..claustrophobia: required full sedation for the hip MRI. 9. Marked iron deficiency anemia: feraheme given 06-29-15 and 07-22-15.  10. PAC placed by IR 08-06-15. Peripheral IV access limited to LUE. 11.NO intolerance to zofran.  12.diarrhea  possibly from herceptin/ perjeta: continues now out 3 weeks so will try to get C diff. Push po fluids. Supplement K IV and po 13.poor nutritional status and poor po intake, tho some better in past few days. Encouraged to try [employer] in her chocolate milk. [facility]. Weight actually up 1 lb today. 14.has been given information on advance directives.  15..hypokalemia likely from diarrhea, supplement and follow, evaluate diarrhea 16.begin pepcid daily for GERD and nausea.  All questions answered. Time spent 35 min including >50% counseling and coordination of care. Chemo, antibody, neulasta orders confirmed,  IVF and IV antiemetics for 08-31-15. Route PCP, cc Dr [name]    Evlyn Clines, MD   08/29/2015, 7:35 PM

## 2015-08-30 ENCOUNTER — Other Ambulatory Visit: Payer: Self-pay | Admitting: Medical Oncology

## 2015-08-30 ENCOUNTER — Ambulatory Visit (HOSPITAL_BASED_OUTPATIENT_CLINIC_OR_DEPARTMENT_OTHER): Payer: Medicaid Other

## 2015-08-30 ENCOUNTER — Other Ambulatory Visit: Payer: Self-pay | Admitting: Oncology

## 2015-08-30 ENCOUNTER — Telehealth: Payer: Self-pay

## 2015-08-30 VITALS — BP 115/60 | HR 98 | Temp 98.2°F | Resp 18

## 2015-08-30 DIAGNOSIS — Z5111 Encounter for antineoplastic chemotherapy: Secondary | ICD-10-CM | POA: Diagnosis not present

## 2015-08-30 DIAGNOSIS — C7951 Secondary malignant neoplasm of bone: Secondary | ICD-10-CM | POA: Diagnosis not present

## 2015-08-30 DIAGNOSIS — C50911 Malignant neoplasm of unspecified site of right female breast: Secondary | ICD-10-CM

## 2015-08-30 DIAGNOSIS — Z5112 Encounter for antineoplastic immunotherapy: Secondary | ICD-10-CM

## 2015-08-30 DIAGNOSIS — E876 Hypokalemia: Secondary | ICD-10-CM

## 2015-08-30 LAB — CANCER ANTIGEN 27.29: CAN 27.29: 109.8 U/mL — AB (ref 0.0–38.6)

## 2015-08-30 MED ORDER — LORATADINE 10 MG PO TABS
10.0000 mg | ORAL_TABLET | Freq: Every day | ORAL | Status: DC
  Administered 2015-08-30: 10 mg via ORAL
  Filled 2015-08-30: qty 1

## 2015-08-30 MED ORDER — SODIUM CHLORIDE 0.9 % IV SOLN
Freq: Once | INTRAVENOUS | Status: AC
  Administered 2015-08-30: 10:00:00 via INTRAVENOUS
  Filled 2015-08-30: qty 4

## 2015-08-30 MED ORDER — ACETAMINOPHEN 325 MG PO TABS
ORAL_TABLET | ORAL | Status: AC
  Filled 2015-08-30: qty 2

## 2015-08-30 MED ORDER — SODIUM CHLORIDE 0.9 % IV SOLN
75.0000 mg/m2 | Freq: Once | INTRAVENOUS | Status: AC
  Administered 2015-08-30: 130 mg via INTRAVENOUS
  Filled 2015-08-30: qty 13

## 2015-08-30 MED ORDER — ACETAMINOPHEN 325 MG PO TABS
650.0000 mg | ORAL_TABLET | Freq: Once | ORAL | Status: AC
  Administered 2015-08-30: 650 mg via ORAL

## 2015-08-30 MED ORDER — FAMOTIDINE IN NACL 20-0.9 MG/50ML-% IV SOLN
INTRAVENOUS | Status: AC
  Filled 2015-08-30: qty 50

## 2015-08-30 MED ORDER — SODIUM CHLORIDE 0.9 % IV SOLN
Freq: Once | INTRAVENOUS | Status: AC
  Administered 2015-08-30: 10:00:00 via INTRAVENOUS
  Filled 2015-08-30: qty 1000

## 2015-08-30 MED ORDER — FAMOTIDINE IN NACL 20-0.9 MG/50ML-% IV SOLN: 20.0000 mg | Freq: Once | INTRAVENOUS | Status: DC

## 2015-08-30 MED ORDER — TRASTUZUMAB CHEMO 150 MG IV SOLR
6.0000 mg/kg | Freq: Once | INTRAVENOUS | Status: AC
  Administered 2015-08-30: 357 mg via INTRAVENOUS
  Filled 2015-08-30: qty 17

## 2015-08-30 MED ORDER — SODIUM CHLORIDE 0.9 % IV SOLN
Freq: Once | INTRAVENOUS | Status: AC
  Administered 2015-08-30: 10:00:00 via INTRAVENOUS

## 2015-08-30 MED ORDER — SODIUM CHLORIDE 0.9 % IV SOLN: INTRAVENOUS | Status: DC

## 2015-08-30 MED ORDER — SODIUM CHLORIDE 0.9 % IV SOLN: Freq: Once | INTRAVENOUS | Status: DC

## 2015-08-30 MED ORDER — PEGFILGRASTIM 6 MG/0.6ML ~~LOC~~ PSKT
6.0000 mg | PREFILLED_SYRINGE | Freq: Once | SUBCUTANEOUS | Status: AC
  Administered 2015-08-30: 6 mg via SUBCUTANEOUS
  Filled 2015-08-30: qty 0.6

## 2015-08-30 MED ORDER — SODIUM CHLORIDE 0.9% FLUSH
10.0000 mL | INTRAVENOUS | Status: DC | PRN
  Administered 2015-08-30: 10 mL
  Filled 2015-08-30: qty 10

## 2015-08-30 MED ORDER — SODIUM CHLORIDE 0.9 % IV SOLN
420.0000 mg | Freq: Once | INTRAVENOUS | Status: AC
  Administered 2015-08-30: 420 mg via INTRAVENOUS
  Filled 2015-08-30: qty 14

## 2015-08-30 MED ORDER — SODIUM CHLORIDE 0.9 % IV SOLN
Freq: Once | INTRAVENOUS | Status: AC
  Administered 2015-08-30: 11:00:00 via INTRAVENOUS
  Filled 2015-08-30: qty 5

## 2015-08-30 MED ORDER — FAMOTIDINE IN NACL 20-0.9 MG/50ML-% IV SOLN
20.0000 mg | Freq: Once | INTRAVENOUS | Status: AC
  Administered 2015-08-30: 20 mg via INTRAVENOUS

## 2015-08-30 MED ORDER — HEPARIN SOD (PORK) LOCK FLUSH 100 UNIT/ML IV SOLN
500.0000 [IU] | Freq: Once | INTRAVENOUS | Status: AC | PRN
  Administered 2015-08-30: 500 [IU]
  Filled 2015-08-30: qty 5

## 2015-08-30 MED ORDER — POTASSIUM CHLORIDE ER 10 MEQ PO TBCR: 20.0000 meq | EXTENDED_RELEASE_TABLET | Freq: Every day | ORAL | 0 refills | Status: DC

## 2015-08-30 NOTE — Telephone Encounter (Signed)
-----   Message from Gordy Levan, MD sent at 08/30/2015  8:55 AM EDT ----- Labs seen and need follow up: will give extra K today with treatment. Infusion to let her know there will be K script at pharmacy.  Please send in 10 mEq potassium two daily # 60

## 2015-08-30 NOTE — Patient Instructions (Signed)
[facility] Discharge Instructions for Patients Receiving Chemotherapy  Today you received the following chemotherapy agents Herceptin/ Perjeta/ Taxotere.  To help prevent nausea and vomiting after your treatment, we encourage you to take your nausea medication as directed by your doctor.   If you develop nausea and vomiting that is not controlled by your nausea medication, call the clinic.   BELOW ARE SYMPTOMS THAT SHOULD BE REPORTED IMMEDIATELY:  *FEVER GREATER THAN 100.5 F  *CHILLS WITH OR WITHOUT FEVER  NAUSEA AND VOMITING THAT IS NOT CONTROLLED WITH YOUR NAUSEA MEDICATION  *UNUSUAL SHORTNESS OF BREATH  *UNUSUAL BRUISING OR BLEEDING  TENDERNESS IN MOUTH AND THROAT WITH OR WITHOUT PRESENCE OF ULCERS  *URINARY PROBLEMS  *BOWEL PROBLEMS  UNUSUAL RASH Items with * indicate a potential emergency and should be followed up as soon as possible.  Feel free to call the clinic you have any questions or concerns. The clinic phone number is [phone].  Please show the [facility] at check-in to the Emergency Department and triage nurse.  Trastuzumab injection for infusion HERCEPTIN What is this medicine? TRASTUZUMAB (tras TOO zoo mab) is a monoclonal antibody. It is used to treat breast cancer and stomach cancer. This medicine may be used for other purposes; ask your health care provider or pharmacist if you have questions. What should I tell my health care provider before I take this medicine? They need to know if you have any of these conditions: -heart disease -heart failure -infection (especially a virus infection such as chickenpox, cold sores, or herpes) -lung or breathing disease, like asthma -recent or ongoing radiation therapy -an unusual or allergic reaction to trastuzumab, benzyl alcohol, or other medications, foods, dyes, or preservatives -pregnant or trying to get pregnant -breast-feeding How should I use this medicine? This drug is given  as an infusion into a vein. It is administered in a hospital or clinic by a specially trained health care professional. Talk to your pediatrician regarding the use of this medicine in children. This medicine is not approved for use in children. Overdosage: If you think you have taken too much of this medicine contact a poison control center or emergency room at once. NOTE: This medicine is only for you. Do not share this medicine with others. What if I miss a dose? It is important not to miss a dose. Call your doctor or health care professional if you are unable to keep an appointment. What may interact with this medicine? -doxorubicin -warfarin This list may not describe all possible interactions. Give your health care provider a list of all the medicines, herbs, non-prescription drugs, or dietary supplements you use. Also tell them if you smoke, drink alcohol, or use illegal drugs. Some items may interact with your medicine. What should I watch for while using this medicine? Visit your doctor for checks on your progress. Report any side effects. Continue your course of treatment even though you feel ill unless your doctor tells you to stop. Call your doctor or health care professional for advice if you get a fever, chills or sore throat, or other symptoms of a cold or flu. Do not treat yourself. Try to avoid being around people who are sick. You may experience fever, chills and shaking during your first infusion. These effects are usually mild and can be treated with other medicines. Report any side effects during the infusion to your health care professional. Fever and chills usually do not happen with later infusions. Do not become pregnant  while taking this medicine or for 7 months after stopping it. Women should inform their doctor if they wish to become pregnant or think they might be pregnant. Women of child-bearing potential will need to have a negative pregnancy test before starting this  medicine. There is a potential for serious side effects to an unborn child. Talk to your health care professional or pharmacist for more information. Do not breast-feed an infant while taking this medicine or for 7 months after stopping it. Women must use effective birth control with this medicine. What side effects may I notice from receiving this medicine? Side effects that you should report to your doctor or other health care professional as soon as possible: -breathing difficulties -chest pain or palpitations -cough -dizziness or fainting -fever or chills, sore throat -skin rash, itching or hives -swelling of the legs or ankles -unusually weak or tired Side effects that usually do not require medical attention (report to your doctor or other health care professional if they continue or are bothersome): -loss of appetite -headache -muscle aches -nausea This list may not describe all possible side effects. Call your doctor for medical advice about side effects. You may report side effects to FDA at 1-800-FDA-1088. Where should I keep my medicine? This drug is given in a hospital or clinic and will not be stored at home. NOTE: This sheet is a summary. It may not cover all possible information. If you have questions about this medicine, talk to your doctor, pharmacist, or health care provider.    2016, Elsevier/Gold Standard. (2014-04-06 11:49:32)

## 2015-08-30 NOTE — Telephone Encounter (Signed)
Went to the infusion romm.  [name] is asleep. Told the interpreter the message below.  She wrote information down for [name] and will review with her when  Is wakes up. Prescription sent to [facility].

## 2015-08-31 ENCOUNTER — Other Ambulatory Visit: Payer: Self-pay | Admitting: Oncology

## 2015-08-31 ENCOUNTER — Ambulatory Visit (HOSPITAL_BASED_OUTPATIENT_CLINIC_OR_DEPARTMENT_OTHER): Payer: Medicaid Other

## 2015-08-31 ENCOUNTER — Encounter: Payer: Self-pay | Admitting: Oncology

## 2015-08-31 DIAGNOSIS — C50911 Malignant neoplasm of unspecified site of right female breast: Secondary | ICD-10-CM

## 2015-08-31 DIAGNOSIS — E860 Dehydration: Secondary | ICD-10-CM

## 2015-08-31 DIAGNOSIS — C7951 Secondary malignant neoplasm of bone: Secondary | ICD-10-CM | POA: Diagnosis not present

## 2015-08-31 MED ORDER — FAMOTIDINE IN NACL 20-0.9 MG/50ML-% IV SOLN
20.0000 mg | Freq: Once | INTRAVENOUS | Status: AC
  Administered 2015-08-31: 20 mg via INTRAVENOUS

## 2015-08-31 MED ORDER — SODIUM CHLORIDE 0.9 % IV SOLN
INTRAVENOUS | Status: DC
  Administered 2015-08-31: 10:00:00 via INTRAVENOUS

## 2015-08-31 MED ORDER — DEXAMETHASONE SODIUM PHOSPHATE 100 MG/10ML IJ SOLN
Freq: Once | INTRAMUSCULAR | Status: AC
  Administered 2015-08-31: 10:00:00 via INTRAVENOUS

## 2015-08-31 MED ORDER — FAMOTIDINE IN NACL 20-0.9 MG/50ML-% IV SOLN
INTRAVENOUS | Status: AC
  Filled 2015-08-31: qty 50

## 2015-08-31 NOTE — Patient Instructions (Signed)

## 2015-09-04 ENCOUNTER — Telehealth: Payer: Self-pay | Admitting: *Deleted

## 2015-09-04 ENCOUNTER — Other Ambulatory Visit: Payer: Self-pay | Admitting: Oncology

## 2015-09-04 ENCOUNTER — Encounter: Payer: Self-pay | Admitting: Oncology

## 2015-09-04 NOTE — Telephone Encounter (Signed)
Call from patient with interpreter.  [phone].  "I've been vomiting all day going back and forth to vomit then stop for a while.  This cycle has happened five to six times, started today.  The smell and site of food makes me sick with dry heaves.  Vomit is not even a cup but small amounts clear or white looks like water.  I've not eaten or drank anything.  Taking both nausea medicines.  Took ativan at 3:00 pm.  No relief.  What do I need to do?"  Will notify provider.  Temp at 3:13 pm = 99.9.  Reports bones, neck and joints are stiff.

## 2015-09-04 NOTE — Telephone Encounter (Signed)
Verbal order received and read back from Dr. Marko Plume per collaborative nurse for patient to take two lorazepam now followed by zofran in an hour.  Drink clear liquids only tonight.  F/U scheduled tomorrow with IVF however if feels worse overnight to report to the ED.  Order given to Felisa Bonier at this time.  "Okay but that's too many pills."  Encouraged to take as ordered to help decrease the N/V.

## 2015-09-05 ENCOUNTER — Other Ambulatory Visit (HOSPITAL_BASED_OUTPATIENT_CLINIC_OR_DEPARTMENT_OTHER): Payer: Medicaid Other

## 2015-09-05 ENCOUNTER — Ambulatory Visit (HOSPITAL_BASED_OUTPATIENT_CLINIC_OR_DEPARTMENT_OTHER): Payer: Medicaid Other

## 2015-09-05 ENCOUNTER — Ambulatory Visit
Admission: RE | Admit: 2015-09-05 | Discharge: 2015-09-05 | Disposition: A | Discharge status: Home / Self Care | Payer: Medicaid Other | Source: Ambulatory Visit | Attending: Radiation Oncology | Admitting: Radiation Oncology

## 2015-09-05 ENCOUNTER — Ambulatory Visit: Payer: Medicaid Other

## 2015-09-05 ENCOUNTER — Encounter: Payer: Self-pay | Admitting: Oncology

## 2015-09-05 ENCOUNTER — Ambulatory Visit (HOSPITAL_BASED_OUTPATIENT_CLINIC_OR_DEPARTMENT_OTHER): Payer: Medicaid Other | Admitting: Oncology

## 2015-09-05 ENCOUNTER — Other Ambulatory Visit (HOSPITAL_COMMUNITY)
Admission: RE | Admit: 2015-09-05 | Discharge: 2015-09-05 | Disposition: A | Discharge status: Home / Self Care | Payer: Medicaid Other | Source: Ambulatory Visit | Attending: Oncology | Admitting: Oncology

## 2015-09-05 VITALS — BP 89/57 | HR 96 | Temp 98.1°F | Resp 18 | Ht 67.0 in | Wt 129.2 lb

## 2015-09-05 DIAGNOSIS — E639 Nutritional deficiency, unspecified: Secondary | ICD-10-CM

## 2015-09-05 DIAGNOSIS — E876 Hypokalemia: Secondary | ICD-10-CM

## 2015-09-05 DIAGNOSIS — R110 Nausea: Secondary | ICD-10-CM

## 2015-09-05 DIAGNOSIS — C50919 Malignant neoplasm of unspecified site of unspecified female breast: Secondary | ICD-10-CM | POA: Diagnosis not present

## 2015-09-05 DIAGNOSIS — C50911 Malignant neoplasm of unspecified site of right female breast: Secondary | ICD-10-CM

## 2015-09-05 DIAGNOSIS — R197 Diarrhea, unspecified: Secondary | ICD-10-CM

## 2015-09-05 DIAGNOSIS — C7951 Secondary malignant neoplasm of bone: Secondary | ICD-10-CM

## 2015-09-05 DIAGNOSIS — G893 Neoplasm related pain (acute) (chronic): Secondary | ICD-10-CM | POA: Diagnosis not present

## 2015-09-05 DIAGNOSIS — C799 Secondary malignant neoplasm of unspecified site: Secondary | ICD-10-CM

## 2015-09-05 DIAGNOSIS — R51 Headache: Secondary | ICD-10-CM | POA: Insufficient documentation

## 2015-09-05 DIAGNOSIS — K219 Gastro-esophageal reflux disease without esophagitis: Secondary | ICD-10-CM | POA: Diagnosis not present

## 2015-09-05 DIAGNOSIS — Z95828 Presence of other vascular implants and grafts: Secondary | ICD-10-CM

## 2015-09-05 DIAGNOSIS — H905 Unspecified sensorineural hearing loss: Secondary | ICD-10-CM | POA: Diagnosis not present

## 2015-09-05 LAB — C DIFFICILE QUICK SCREEN W PCR REFLEX
C DIFFICILE (CDIFF) INTERP: NOT DETECTED
C DIFFICILE (CDIFF) TOXIN: NEGATIVE
C DIFFICLE (CDIFF) ANTIGEN: NEGATIVE

## 2015-09-05 LAB — COMPREHENSIVE METABOLIC PANEL
ALBUMIN: 2.9 g/dL — AB (ref 3.5–5.0)
ALT: 30 U/L (ref 0–55)
AST: 14 U/L (ref 5–34)
Alkaline Phosphatase: 88 U/L (ref 40–150)
Anion Gap: 8 mEq/L (ref 3–11)
BILIRUBIN TOTAL: 2.34 mg/dL — AB (ref 0.20–1.20)
BUN: 9 mg/dL (ref 7.0–26.0)
CO2: 31 meq/L — AB (ref 22–29)
CREATININE: 0.7 mg/dL (ref 0.6–1.1)
Calcium: 8.4 mg/dL (ref 8.4–10.4)
Chloride: 101 mEq/L (ref 98–109)
GLUCOSE: 86 mg/dL (ref 70–140)
Potassium: 3.2 mEq/L — ABNORMAL LOW (ref 3.5–5.1)
SODIUM: 140 meq/L (ref 136–145)
TOTAL PROTEIN: 5.8 g/dL — AB (ref 6.4–8.3)

## 2015-09-05 LAB — CBC WITH DIFFERENTIAL/PLATELET
BASO%: 0.2 % (ref 0.0–2.0)
Basophils Absolute: 0 10*3/uL (ref 0.0–0.1)
EOS ABS: 0 10*3/uL (ref 0.0–0.5)
EOS%: 0.4 % (ref 0.0–7.0)
HEMATOCRIT: 29.1 % — AB (ref 34.8–46.6)
HGB: 10.1 g/dL — ABNORMAL LOW (ref 11.6–15.9)
LYMPH#: 1.1 10*3/uL (ref 0.9–3.3)
LYMPH%: 18.6 % (ref 14.0–49.7)
MCH: 30.1 pg (ref 25.1–34.0)
MCHC: 34.6 g/dL (ref 31.5–36.0)
MCV: 86.8 fL (ref 79.5–101.0)
MONO#: 0.4 10*3/uL (ref 0.1–0.9)
MONO%: 6.3 % (ref 0.0–14.0)
NEUT%: 74.5 % (ref 38.4–76.8)
NEUTROS ABS: 4.4 10*3/uL (ref 1.5–6.5)
PLATELETS: 157 10*3/uL (ref 145–400)
RBC: 3.35 10*6/uL — AB (ref 3.70–5.45)
RDW: 16.1 % — ABNORMAL HIGH (ref 11.2–14.5)
WBC: 5.9 10*3/uL (ref 3.9–10.3)

## 2015-09-05 MED ORDER — SODIUM CHLORIDE 0.9% FLUSH
10.0000 mL | INTRAVENOUS | Status: DC | PRN
  Administered 2015-09-05: 10 mL via INTRAVENOUS
  Filled 2015-09-05: qty 10

## 2015-09-05 MED ORDER — SODIUM CHLORIDE 0.9 % IV SOLN
INTRAVENOUS | Status: DC
  Administered 2015-09-05: 16:00:00 via INTRAVENOUS
  Filled 2015-09-05 (×2): qty 1000

## 2015-09-05 MED ORDER — SODIUM CHLORIDE 0.9 % IV SOLN
4.0000 mg | Freq: Once | INTRAVENOUS | Status: AC
  Administered 2015-09-05: 4 mg via INTRAVENOUS
  Filled 2015-09-05: qty 0.4

## 2015-09-05 MED ORDER — FAMOTIDINE IN NACL 20-0.9 MG/50ML-% IV SOLN
20.0000 mg | Freq: Once | INTRAVENOUS | Status: AC
  Administered 2015-09-05: 20 mg via INTRAVENOUS

## 2015-09-05 MED ORDER — POTASSIUM CHLORIDE CRYS ER 20 MEQ PO TBCR
20.0000 meq | EXTENDED_RELEASE_TABLET | Freq: Once | ORAL | Status: AC
  Administered 2015-09-05: 20 meq via ORAL
  Filled 2015-09-05: qty 1

## 2015-09-05 MED ORDER — FAMOTIDINE IN NACL 20-0.9 MG/50ML-% IV SOLN
INTRAVENOUS | Status: AC
  Filled 2015-09-05: qty 50

## 2015-09-05 MED ORDER — HEPARIN SOD (PORK) LOCK FLUSH 100 UNIT/ML IV SOLN
500.0000 [IU] | Freq: Once | INTRAVENOUS | Status: AC
  Administered 2015-09-05: 500 [IU] via INTRAVENOUS
  Filled 2015-09-05: qty 5

## 2015-09-05 MED ORDER — SODIUM CHLORIDE 0.9% FLUSH
10.0000 mL | INTRAVENOUS | Status: DC | PRN
  Administered 2015-09-05: 10 mL
  Filled 2015-09-05: qty 10

## 2015-09-05 NOTE — Progress Notes (Signed)
Radiation Oncology         [phone] ________________________________  Name: [name] MRN: [id number]  Date: 09/05/2015  DOB: 14-Dec-1965  Re-Evaluation Visit Note  CC: Evette Doffing, MD  Gordy Levan, MD    ICD-9-CM ICD-10-CM   1. [name], right (HCC) 174.9 C50.911    198.5 C79.51   2. Bony metastasis (HCC) 198.5 C79.51     Diagnosis: Recurrent breast cancer with extensive osseous metastasis  Interval Since Last Radiation:  5 weeks  07/12/2015-07/26/2015: Left femur, 30 Gy in 10 fractions  Narrative:  The patient returns today for routine follow-up of her left femur treatment and to be evaluated for additional palliative radiation therapy. She reports that the pain in her left leg is improving.  She is able to walk and sleep on that side, but has trouble with bending her knees or crossing her legs.  She reports having headaches for the past 3 days that radiate down to her neck and shoulders.  She has been taking Aleve.  She also reports having diarrhea since she had her first chemo infusion. She was given a sitz bath for skin irritation.  A sample has been sent to lab for C Diff by Dr. Mariana Kaufman office.  She is currently getting IV fluids.  Vitals were taken in Dr. Mariana Kaufman office today. PET scan on 07/19/15 showed extensive disease throughout the spine with prominent involvement at C7 and T1 that could explain her neck pain. The scan notes severe multifocal osseous metastatic disease and multifocal nodal metastases involving the right internal mammary, prevascular, and subpectoral lymph nodes.  ALLERGIES:  is allergic to chlorhexidine; promethazine hcl; and diphenhydramine hcl.  Meds: Current Outpatient Prescriptions  Medication Sig Dispense Refill  . dexamethasone (DECADRON) 4 MG tablet Take 2 tablets with food twice a day for 3 days. Begin 8/17. (Patient not taking: Reported on 09/05/2015) 12 tablet 1  . divalproex (DEPAKOTE) 250 MG DR  tablet Take 250 mg by mouth at bedtime. Takes 2 tablets    . famotidine (PEPCID) 20 MG tablet Take 1 tablet (20 mg total) by mouth 2 (two) times daily. 60 tablet 0  . HYDROcodone-acetaminophen (NORCO) 7.5-325 MG tablet Take 1 tablet by mouth every 4-6 hours as needed for pain 40 tablet 0  . Ibuprofen (ADVIL) 200 MG CAPS Take 2 capsules by mouth as needed. Reported on 07/26/2015    . lidocaine-prilocaine (EMLA) cream Apply 1 application topically as needed. Apply to Porta-Cath 1-2 hours prior to access as directed. 30 g 1  . loperamide (IMODIUM A-D) 2 MG tablet Take 2-4 mg by mouth daily as needed for diarrhea or loose stools.    Marland Kitchen LORazepam (ATIVAN) 0.5 MG tablet May take 1 tablet by mouth or place under the tongue every 6 hrs as needed for nausea. 20 tablet 0  . ondansetron (ZOFRAN) 8 MG tablet Take 1 tablet (8 mg total) by mouth every 8 (eight) hours as needed for nausea or vomiting. 30 tablet 0  . phenol (CHLORASEPTIC) 1.4 % LIQD Use as directed 1-2 sprays in the mouth or throat as needed for throat irritation / pain.    . potassium chloride (K-DUR) 10 MEQ tablet Take 2 tablets (20 mEq total) by mouth daily. (Patient not taking: Reported on 09/05/2015) 60 tablet 0   No current facility-administered medications for this encounter.    Facility-Administered Medications Ordered in Other Encounters  Medication Dose Route Frequency Provider Last Rate Last Dose  .  heparin lock flush 100 unit/mL  500 Units Intravenous Once Lennis P Livesay, MD      . sodium chloride 0.9 % 1,000 mL with potassium chloride 20 mEq infusion   Intravenous Continuous Lennis Marion Downer, MD   Stopped at 09/05/15 1817  . sodium chloride flush (NS) 0.9 % injection 10 mL  10 mL Intravenous PRN Lennis Marion Downer, MD        Physical Findings: The patient is in no acute distress. Patient is alert and oriented.  Vitals with BMI 09/05/2015  Height 5\' 7"   Weight 129 lbs 3 oz  BMI 0000000  Systolic 89  Diastolic 57  Pulse 96    Respirations 18  Patient is resting comfortably in her hospital wheelchair. She has IV fluids in place in light of her diarrhea. Patient is accompanied by her husband and interpreter. The patient complains of pain in the neck and upper back region. She also complains some pain  in the head region. She denies any blurred vision or double vision. On neurological examination cranial nerves II through XII are intact. Motor strength is 5 out of 5 in the proximal and distal muscle groups of the upper lower extremities.  Lab Findings: Lab Results  Component Value Date   WBC 5.9 09/05/2015   HGB 10.1 (L) 09/05/2015   HCT 29.1 (L) 09/05/2015   MCV 86.8 09/05/2015   PLT 157 09/05/2015    Radiographic Findings: No results found.   Impression:  Recurrent breast cancer with extensive osseous metastasis. Patient is symptomatic from her extensive osseous metastasis in the lower cervical and upper thoracic spine area. She'll be a good candidate for palliative radiation therapy directed to this region. I discussed the treatment course side effects and potential toxicities of radiation therapy in this situation with the patient and her husband. She appears to understand and wishes to proceed with treatment. Patient is having headaches too and I would recommend a scanning of the brain. The patient is claustrophobic and feels she would not be able to tolerate an MRI. Patient will be scheduled for a CT scan of the brain with contrast assuming her kidney function is adequate.  Plan:  Patient will proceed with her head CT scan early next week and then be evaluated for lower cervical and upper thoracic spine radiation therapy and possibly brain radiation therapy if her head CT scan shows metastasis.  ____________________________________ --------------------------------  Blair Promise, PhD, MD  This document serves as a record of services personally performed by Gery Pray, MD. It was created on his behalf by  Darcus Austin, a trained medical scribe. The creation of this record is based on the scribe's personal observations and the provider's statements to them. This document has been checked and approved by the attending provider.

## 2015-09-05 NOTE — Progress Notes (Signed)
Pt and VS stable at time of discharge.  

## 2015-09-05 NOTE — Progress Notes (Addendum)
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 She had cycle 2 taxotere herceptin perjeta on 08-30-15 with on pro neulasta and IVF on 08-31-15. She is due zometa today, will be delayed until 8-25 due to need for IVF today.  Patient again has had difficult time following treatment, with nausea, vomiting, diarrhea, pain, difficulty with po's. She may be having HA with zofran, reports intolerance to promethazine (such that we have not tried prochlorperazine or reglan), did not try ativan SL despite instructions. No other different neurologic symptoms. She reports no po fluids today. She has had multiple watery stools daily last few days, perirectal soreness from the diarrhea.She tends to vomit with the diarrhea. No abdominal pain, no bleeding. She did not get specimen for C diff as previously requested, however we have gotten that now with result pending at time of visit. She complains of pain in neck more than other areas including left hip, but has not taken hydrocodone in at least 2 weeks, did not try dosing that at night as we had carefully discussed at last visit. She is ambulatory, no increased weakness. She is not complaining of GERD now. She is sleeping poorly. No bleeding. No fever. She is voiding. No problems with PAC. Denies SOB or cough. Denies peripheral neuropathy Remainder of 10 point Review of Systems unchanged.  Seems to have other social issues impacting, as patient reports she cannnot take pain meds while watching grandchildren. Daughter is in home, husband watches grands when he is not at work. This information not clear to me now.  I  will ask [facility] SW to follow up.  PAC by IR 08-06-15 Feraheme 06-29-15 and 07-22-15 Genetics testing negative by Breast Ovarian panel 03-2015 Left [facility] 03-15-15 heterogeneously dense tissue otherwise negative. CA [id number] on 06-26-15: 72.5    ONCOLOGIC HISTORY Patient had 1.4 cm poorly differentiated right breast carcinoma diagnosed in Nov 1999 at age 50, 1/7 axillary nodes involved, ER/PR and HER 2 positive. She had mastectomy with the 7 axillary node evaluation, 4 cycles of adriamycin/ cytoxan followed by taxotere, then five years of tamoxifen thru June 2005 (no herceptin in 1999). She did not have local radiation. She had right TRAM reconstruction. She had bilateral salpingoophorectomy in May 2005. She was briefly on aromatase inhibitor after tamoxifen, but discontinued this herself due to poor tolerance. Most recent left mammogram was done as screening on 04-15-2012 with heterogeneously dense tissue and questioned asymmetry, followed by diagnostic left mamogram on 04-28-12 which had no findings of concern. 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 She had nailing of impending left femur fracture by Dr [name] on 06-26-15. Pathology [id number] from 06-26-15 showed metastatic adenocarcinoma , strongly ER positive 95%, PR positive 2%, HER 2 positive 3+. Plain xrays suggested T1 and left 4th rib involvement, tho these also did not show on bone scan 07-01-15; like unremarkable CT, bone scan showed only activity in left femur area of surgery. PET 07-19-15 showed extensive bone involvement axial and proximal appendicular skeleton, "severe multifocal osseous metastatic disease, much greater than anticipated based on prior imaging",  involved nodes right subpectoral and right internal mammary, and no distant extraosseous mets.  She had radiation to left femur 30 cGy completed 07-26-15. She  continued IV bisphosphonate with zometa on 08-07-15 and had first taxotere herceptin perjeta on 08-09-15.   Objective:  Vital signs in last 24 hours:  BP (!) 89/57 (BP Location: Right Arm, Patient Position: Sitting)   Pulse 96   Temp 98.1 F (36.7 C) (Oral)   Resp 18   Ht 5' 7" (1.702 m)   Wt 129 lb 3.2 oz (58.6 kg)   SpO2 100%   BMI 20.24 kg/m  Weight down 2 lbs from 08-29-15. Looks uncomfortable but not acutely ill, Respirations not labored.  Signs rapidly and very dramatic as before,  history not easy to obtain.  Alert, oriented and appropriate. Ambulatory without assistance to and from bathroom, moving quickly tho favoring left leg, agreed to [facility] to go to infusion.  Alopecia  HEENT:PERRL, sclerae not icteric. Oral mucosa moist- not dry- without lesions, posterior pharynx clear.  Neck supple. No JVD.  Lymphatics:no cervical,supraclavicular adenopathy Resp: clear to auscultation bilaterally and normal percussion bilaterally Cardio: tachy, regular rate and rhythm. No gallop. GI: soft, nontender in epigastrium, mildly tender diffusely otherwise without rebound, not distended, no mass or organomegaly. Normally active bowel sounds. Perirectal area slight erythema, no external hemorrhoids, no skin breakdown Musculoskeletal/ Extremities: UE/ LE without pitting edema, cords, tenderness Neuro: no peripheral neuropathy. Congenital deafness, no other CN abnormalities, strength equal LE, somewhat decreased LLE with recent surgery unchanged, otherwise nonfocal Skin without rash, ecchymosis, petechiae Portacath-without erythema or tenderness  Patient vomited and had watery diarrhea while in exam room area, seen by RN  Lab Results:  Results for orders placed or performed in visit on 09/05/15  CBC with Differential  Result Value Ref Range   WBC 5.9 3.9 - 10.3 10e3/uL   NEUT# 4.4 1.5 - 6.5 10e3/uL   HGB 10.1 (L) 11.6 - 15.9 g/dL   HCT 29.1 (L) 34.8 - 46.6 %   Platelets 157 145 - 400 10e3/uL    MCV 86.8 79.5 - 101.0 fL   MCH 30.1 25.1 - 34.0 pg   MCHC 34.6 31.5 - 36.0 g/dL   RBC 3.35 (L) 3.70 - 5.45 10e6/uL   RDW 16.1 (H) 11.2 - 14.5 %   lymph# 1.1 0.9 - 3.3 10e3/uL   MONO# 0.4 0.1 - 0.9 10e3/uL   Eosinophils Absolute 0.0 0.0 - 0.5 10e3/uL   Basophils Absolute 0.0 0.0 - 0.1 10e3/uL   NEUT% 74.5 38.4 - 76.8 %   LYMPH% 18.6 14.0 - 49.7 %   MONO% 6.3 0.0 - 14.0 %   EOS% 0.4 0.0 - 7.0 %   BASO% 0.2 0.0 - 2.0 %  Comprehensive metabolic panel  Result Value Ref Range   Sodium 140 136 - 145 mEq/L   Potassium 3.2 (L) 3.5 - 5.1 mEq/L   Chloride 101 98 - 109 mEq/L   CO2 31 (H) 22 - 29 mEq/L   Glucose 86 70 - 140 mg/dl   BUN 9.0 7.0 - 26.0 mg/dL   Creatinine 0.7 0.6 - 1.1 mg/dL   Total Bilirubin 2.34 (H) 0.20 - 1.20 mg/dL   Alkaline Phosphatase 88 40 - 150 U/L   AST 14 5 - 34 U/L   ALT 30 0 - 55 U/L   Total Protein 5.8 (L) 6.4 - 8.3 g/dL   Albumin 2.9 (L) 3.5 - 5.0 g/dL   Calcium 8.4 8.4 - 10.4 mg/dL  Anion Gap 8 3 - 11 mEq/L   EGFR >90 >90 ml/min/1.73 m2   Stool resulted after visit negative for C diff  Studies/Results:  No results found.  Medications: I have reviewed the patient's current medications. Needs refill on ativan, which will be given as 1 mg tablets. GIven IV and po KCL today and will give additional IV with fluids on 8-25.  I am not clear that she has used imodium, tho that is listed on her medication; will give imodium at [facility] with IVF on 8-25 if diarrhea ongoing, and she will be instructed to use imodium 1-2 after each loose stool up to total 6 in 24 hrs (rather than 8 in 24 hrs to start). Patient agrees to take hydrocodone 7.5 mg two tablets at hs tonight. She has the medication and understands that she can use as directed for pain.  She has not taken oral potassium since that was prescribed last week.   DISCUSSION Multiple issues and concerns discussed: nausea, diarrhea, uncontrolled pain, poor po intake, medication noncompliance, rationale for  treatment.  I have reminded her that she felt much better out further from treatment with cycle 1, and hopefully this will be the case again in next week.  Medications as above  She agrees to IVF 8-25 even if bed not available, as her [birth year] is this weekend so that she declines IVF on 8-26. WIth need for hydration today and rad onc visit, will give only IVF today. Appreciate [facility] infusion working with her to give additional IVF and zometa on 09-06-15.  Suggested cleaning perirectal area with wet washcloth instead of toilet paper. She is willing to do sitz baths, set up to be obtained from rad onc.  She is encouraged to call if any questions about medications. She agrees to take oral potassium now. I believe that she is now taking pepcid.   I have spoken directly with Dr Clabe Seal RN prior to his visit now, requesting that he consider RT to C spine and requesting sitz bath from their supply.    Following visit I called [facility] care management, however patient does not qualify for their home nurse assistance due to insurance.  Communication with [facility] pharmacist and my partners in breast oncology re herceptin perjeta if RT to C spine.    Assessment/Plan:  1. Metastatic ER+, HER 2 + breast cancer extensively involving axial and appendicular skeleton: history of T1N1 right breast carcinoma age 50, ER/PR +, HER 2 + in Nov 1999, no HER 2 therapy in 1999. Post nailing of impending fracture left femur by Dr [name] 06-26-15 and local radiation. FirstHerceptin, Perjeta and taxotere 08-09-15, with on pro neulasta; cycle 2 given 08-30-15.  IVF today, Dr Sondra Come to reevaluate today. IVF + zometa on 09-06-15. She is also set up for IVF on 8-29 if needed. I will see her at least 9-5. Will need to hold taxotere if radiation given.  2. Echocardiogram 08-05-15 with EF 60-65% , prior adriamycin < 300 mg/m2, no chest radiation. Will need to repeat echo q 3 months on HER2 therapy 3.congenital deafness: needs sign language  interpreter for all visits. Husband also deaf. All communication needs to be very clear 4.positional vertigo: followed by neurology. Note she does not tolerate lying on stomach due to vertigo, in case breast MRI considered 5.post laparoscopic cholecystectomy 2004 6.right TRAM with original breast cancer 1999 7.premature surgical menopause, post BSO 209fr pelvic pain and ovarian cysts.7. 8..claustrophobia: required full sedation for the hip MRI. 9. Marked iron  deficiency anemia: feraheme given 06-29-15 and 07-22-15.  10. PAC placed by IR 08-06-15. Peripheral IV access limited to LUE. 11.Possible HA with zofran, tho we had previously thought this was not the case. Antiemetics unfortunately very limited by med intolerances, but should be able to add aloxi next treatment if avoiding zofran. Note she refuses to use ativan SL, discussed.  12.diarrhea likely from (herceptin/) perjeta as C diff negative. Encourage imodium. 13.poor nutritional status and poor po intake, worse since most recent treatment.  Encouraged to try [employer] in her chocolate milk. [facility] dietician assisting. 14.has been given information on advance directives.  15..hypokalemia likely from diarrhea, supplement and follow 16.continue pepcid daily for GERD and nausea.   Questions elicited and addressed with assistance of interpreter. Patient and husband seem to understand discussion. IVF orders entered, spoke with infusion Freight forwarder and RN, rad onc, communication with [employer]/support/administrative officer and SW.  Time spent 35 min including >50% counseling and coordination of care. Cc Dr Sondra Come     Debra Clines, MD   09/05/2015, 9:37 PM

## 2015-09-05 NOTE — Progress Notes (Signed)
[name] is here for follow up.  She reports that the pain in her left leg is improving.  She is able to walk and sleep on that side but has trouble with bending her knees or crossing her legs.  She reports having headaches for the past 3 days that radiate down to her neck and shoulders.  She has been taking Aleve.  She also reports having diarrhea since she had her first chemo infusion. She was given a sitz bath for skin irritation.  A sample has been sent to lab for C Diff by Dr. Mariana Kaufman office.  She is currently getting IV fluids.  Vitals were taken in Dr. Mariana Kaufman office today.    Wt Readings from Last 3 Encounters:  09/05/15 129 lb 3.2 oz (58.6 kg)  08/29/15 131 lb 9.6 oz (59.7 kg)  08/19/15 130 lb 8 oz (59.2 kg)

## 2015-09-05 NOTE — Patient Instructions (Signed)
Dehydration, Adult Dehydration is a condition in which you do not have enough fluid or water in your body. It happens when you take in less fluid than you lose. Vital organs such as the kidneys, brain, and heart cannot function without a proper amount of fluids. Any loss of fluids from the body can cause dehydration.  Dehydration can range from mild to severe. This condition should be treated right away to help prevent it from becoming severe. CAUSES  This condition may be caused by:  Vomiting.  Diarrhea.  Excessive sweating, such as when exercising in hot or humid weather.  Not drinking enough fluid during strenuous exercise or during an illness.  Excessive urine output.  Fever.  Certain medicines. RISK FACTORS This condition is more likely to develop in:  People who are taking certain medicines that cause the body to lose excess fluid (diuretics).   People who have a chronic illness, such as diabetes, that may increase urination.  Older adults.   People who live at high altitudes.   People who participate in endurance sports.  SYMPTOMS  Mild Dehydration  Thirst.  Dry lips.  Slightly dry mouth.  Dry, warm skin. Moderate Dehydration  Very dry mouth.   Muscle cramps.   Dark urine and decreased urine production.   Decreased tear production.   Headache.   Light-headedness, especially when you stand up from a sitting position.  Severe Dehydration  Changes in skin.   Cold and clammy skin.   Skin does not spring back quickly when lightly pinched and released.   Changes in body fluids.   Extreme thirst.   No tears.   Not able to sweat when body temperature is high, such as in hot weather.   Minimal urine production.   Changes in vital signs.   Rapid, weak pulse (more than 100 beats per minute when you are sitting still).   Rapid breathing.   Low blood pressure.   Other changes.   Sunken eyes.   Cold hands and feet.    Confusion.  Lethargy and difficulty being awakened.  Fainting (syncope).   Short-term weight loss.   Unconsciousness. DIAGNOSIS  This condition may be diagnosed based on your symptoms. You may also have tests to determine how severe your dehydration is. These tests may include:   Urine tests.   Blood tests.  TREATMENT  Treatment for this condition depends on the severity. Mild or moderate dehydration can often be treated at home. Treatment should be started right away. Do not wait until dehydration becomes severe. Severe dehydration needs to be treated at the hospital. Treatment for Mild Dehydration  Drinking plenty of water to replace the fluid you have lost.   Replacing minerals in your blood (electrolytes) that you may have lost.  Treatment for Moderate Dehydration  Consuming oral rehydration solution (ORS). Treatment for Severe Dehydration  Receiving fluid through an IV tube.   Receiving electrolyte solution through a feeding tube that is passed through your nose and into your stomach (nasogastric tube or NG tube).  Correcting any abnormalities in electrolytes. HOME CARE INSTRUCTIONS   Drink enough fluid to keep your urine clear or pale yellow.   Drink water or fluid slowly by taking small sips. You can also try sucking on ice cubes.  Have food or beverages that contain electrolytes. Examples include bananas and sports drinks.  Take over-the-counter and prescription medicines only as told by your health care provider.   Prepare ORS according to the manufacturer's instructions. Take sips   of ORS every 5 minutes until your urine returns to normal.  If you have vomiting or diarrhea, continue to try to drink water, ORS, or both.   If you have diarrhea, avoid:   Beverages that contain caffeine.   Fruit juice.   Milk.   Carbonated soft drinks.  Do not take salt tablets. This can lead to the condition of having too much sodium in your body  (hypernatremia).  SEEK MEDICAL CARE IF:  You cannot eat or drink without vomiting.  You have had moderate diarrhea during a period of more than 24 hours.  You have a fever. SEEK IMMEDIATE MEDICAL CARE IF:   You have extreme thirst.  You have severe diarrhea.  You have not urinated in 6-8 hours, or you have urinated only a small amount of very dark urine.  You have shriveled skin.  You are dizzy, confused, or both.   This information is not intended to replace advice given to you by your health care provider. Make sure you discuss any questions you have with your health care provider.   Document Released: 12/29/2004 Document Revised: 09/19/2014 Document Reviewed: 05/16/2014 Elsevier Interactive Patient Education 2016 Elsevier Inc.   Hypokalemia Hypokalemia means that the amount of potassium in the blood is lower than normal.Potassium is a chemical, called an electrolyte, that helps regulate the amount of fluid in the body. It also stimulates muscle contraction and helps nerves function properly.Most of the body's potassium is inside of cells, and only a very small amount is in the blood. Because the amount in the blood is so small, minor changes can be life-threatening. CAUSES  Antibiotics.  Diarrhea or vomiting.  Using laxatives too much, which can cause diarrhea.  Chronic kidney disease.  Water pills (diuretics).  Eating disorders (bulimia).  Low magnesium level.  Sweating a lot. SIGNS AND SYMPTOMS  Weakness.  Constipation.  Fatigue.  Muscle cramps.  Mental confusion.  Skipped heartbeats or irregular heartbeat (palpitations).  Tingling or numbness. DIAGNOSIS  Your health care provider can diagnose hypokalemia with blood tests. In addition to checking your potassium level, your health care provider may also check other lab tests. TREATMENT Hypokalemia can be treated with potassium supplements taken by mouth or adjustments in your current medicines.  If your potassium level is very low, you may need to get potassium through a vein (IV) and be monitored in the hospital. A diet high in potassium is also helpful. Foods high in potassium are:  Nuts, such as peanuts and pistachios.  Seeds, such as sunflower seeds and pumpkin seeds.  Peas, lentils, and lima beans.  Whole grain and bran cereals and breads.  Fresh fruit and vegetables, such as apricots, avocado, bananas, cantaloupe, kiwi, oranges, tomatoes, asparagus, and potatoes.  Orange and tomato juices.  Red meats.  Fruit yogurt. HOME CARE INSTRUCTIONS  Take all medicines as prescribed by your health care provider.  Maintain a healthy diet by including nutritious food, such as fruits, vegetables, nuts, whole grains, and lean meats.  If you are taking a laxative, be sure to follow the directions on the label. SEEK MEDICAL CARE IF:  Your weakness gets worse.  You feel your heart pounding or racing.  You are vomiting or having diarrhea.  You are diabetic and having trouble keeping your blood glucose in the normal range. SEEK IMMEDIATE MEDICAL CARE IF:  You have chest pain, shortness of breath, or dizziness.  You are vomiting or having diarrhea for more than 2 days.  You faint. MAKE   SURE YOU:   Understand these instructions.  Will watch your condition.  Will get help right away if you are not doing well or get worse.   This information is not intended to replace advice given to you by your health care provider. Make sure you discuss any questions you have with your health care provider.   Document Released: 12/29/2004 Document Revised: 01/19/2014 Document Reviewed: 07/01/2012 Elsevier Interactive Patient Education 2016 Elsevier Inc.  

## 2015-09-06 ENCOUNTER — Telehealth: Payer: Self-pay

## 2015-09-06 ENCOUNTER — Ambulatory Visit (HOSPITAL_BASED_OUTPATIENT_CLINIC_OR_DEPARTMENT_OTHER): Payer: Medicaid Other

## 2015-09-06 VITALS — BP 97/56 | HR 85 | Temp 98.2°F | Resp 18

## 2015-09-06 DIAGNOSIS — E876 Hypokalemia: Secondary | ICD-10-CM

## 2015-09-06 DIAGNOSIS — C50911 Malignant neoplasm of unspecified site of right female breast: Secondary | ICD-10-CM

## 2015-09-06 DIAGNOSIS — C7951 Secondary malignant neoplasm of bone: Secondary | ICD-10-CM | POA: Diagnosis not present

## 2015-09-06 DIAGNOSIS — C50919 Malignant neoplasm of unspecified site of unspecified female breast: Secondary | ICD-10-CM

## 2015-09-06 DIAGNOSIS — C799 Secondary malignant neoplasm of unspecified site: Principal | ICD-10-CM

## 2015-09-06 MED ORDER — ZOLEDRONIC ACID 4 MG/100ML IV SOLN
4.0000 mg | Freq: Once | INTRAVENOUS | Status: AC
  Administered 2015-09-06: 4 mg via INTRAVENOUS
  Filled 2015-09-06: qty 100

## 2015-09-06 MED ORDER — FAMOTIDINE IN NACL 20-0.9 MG/50ML-% IV SOLN
20.0000 mg | Freq: Once | INTRAVENOUS | Status: AC
  Administered 2015-09-06: 20 mg via INTRAVENOUS

## 2015-09-06 MED ORDER — LORAZEPAM 1 MG PO TABS: ORAL_TABLET | ORAL | 0 refills | Status: DC

## 2015-09-06 MED ORDER — SODIUM CHLORIDE 0.9 % IV SOLN
Freq: Once | INTRAVENOUS | Status: AC
  Administered 2015-09-06: 16:00:00 via INTRAVENOUS

## 2015-09-06 MED ORDER — LOPERAMIDE HCL 2 MG PO TABS: ORAL_TABLET | ORAL | 1 refills | Status: DC

## 2015-09-06 MED ORDER — FAMOTIDINE IN NACL 20-0.9 MG/50ML-% IV SOLN
INTRAVENOUS | Status: AC
  Filled 2015-09-06: qty 50

## 2015-09-06 MED ORDER — HEPARIN SOD (PORK) LOCK FLUSH 100 UNIT/ML IV SOLN
500.0000 [IU] | Freq: Once | INTRAVENOUS | Status: AC | PRN
  Administered 2015-09-06: 500 [IU]
  Filled 2015-09-06: qty 5

## 2015-09-06 MED ORDER — SODIUM CHLORIDE 0.9 % IV SOLN
INTRAVENOUS | Status: DC
  Administered 2015-09-06: 16:00:00 via INTRAVENOUS
  Filled 2015-09-06 (×2): qty 1000

## 2015-09-06 NOTE — Patient Instructions (Signed)

## 2015-09-06 NOTE — Telephone Encounter (Signed)
Told Ms Oberlander that her stool was negative for C-diff. Reviewed information regarding Ativan and Imodium  Prescriptions as noted below by Dr. Marko Plume.  Prescriptions called into her pharmacy.

## 2015-09-06 NOTE — Telephone Encounter (Signed)
-----   Message from Gordy Levan, MD sent at 09/06/2015 11:15 AM EDT ----- Please do script  lorazepam 1 mg SL or po every 4-6 hrs prn nausea. #30. Have pharmacist remind her that this is same strength as 2 of her present 0.5 mg tablets.  Tell pharmacist that she should also pick up imodium, to use 1-2 tablets after each loose stool up to 6/ 24 hrs.   thanks

## 2015-09-06 NOTE — Telephone Encounter (Signed)
Called in imodium and ativan to pharmacist noting directions as noted below by Dr. Marko Plume.

## 2015-09-10 ENCOUNTER — Encounter: Payer: Self-pay | Admitting: Oncology

## 2015-09-10 ENCOUNTER — Other Ambulatory Visit: Payer: Self-pay | Admitting: Oncology

## 2015-09-10 ENCOUNTER — Ambulatory Visit (HOSPITAL_BASED_OUTPATIENT_CLINIC_OR_DEPARTMENT_OTHER): Payer: Medicaid Other

## 2015-09-10 VITALS — BP 102/63 | HR 88 | Temp 98.3°F | Resp 16

## 2015-09-10 DIAGNOSIS — E876 Hypokalemia: Secondary | ICD-10-CM | POA: Diagnosis present

## 2015-09-10 DIAGNOSIS — T451X5A Adverse effect of antineoplastic and immunosuppressive drugs, initial encounter: Secondary | ICD-10-CM

## 2015-09-10 DIAGNOSIS — C50911 Malignant neoplasm of unspecified site of right female breast: Secondary | ICD-10-CM | POA: Diagnosis not present

## 2015-09-10 DIAGNOSIS — C7951 Secondary malignant neoplasm of bone: Principal | ICD-10-CM

## 2015-09-10 DIAGNOSIS — R112 Nausea with vomiting, unspecified: Secondary | ICD-10-CM

## 2015-09-10 MED ORDER — HEPARIN SOD (PORK) LOCK FLUSH 100 UNIT/ML IV SOLN
250.0000 [IU] | Freq: Once | INTRAVENOUS | Status: AC | PRN
  Administered 2015-09-10: 250 [IU]
  Filled 2015-09-10: qty 5

## 2015-09-10 MED ORDER — FAMOTIDINE IN NACL 20-0.9 MG/50ML-% IV SOLN
20.0000 mg | Freq: Once | INTRAVENOUS | Status: AC
  Administered 2015-09-10: 20 mg via INTRAVENOUS

## 2015-09-10 MED ORDER — SODIUM CHLORIDE 0.9% FLUSH
3.0000 mL | Freq: Once | INTRAVENOUS | Status: AC | PRN
  Administered 2015-09-10: 3 mL via INTRAVENOUS
  Filled 2015-09-10: qty 10

## 2015-09-10 MED ORDER — LORAZEPAM 2 MG/ML IJ SOLN
0.5000 mg | Freq: Once | INTRAMUSCULAR | Status: AC | PRN
  Administered 2015-09-10: 0.5 mg via INTRAVENOUS

## 2015-09-10 MED ORDER — SODIUM CHLORIDE 0.9 % IV SOLN
INTRAVENOUS | Status: DC
  Administered 2015-09-10: 15:00:00 via INTRAVENOUS
  Filled 2015-09-10 (×2): qty 1000

## 2015-09-10 MED ORDER — LORAZEPAM 2 MG/ML IJ SOLN
INTRAMUSCULAR | Status: AC
  Filled 2015-09-10: qty 1

## 2015-09-10 MED ORDER — SODIUM CHLORIDE 0.9 % IV SOLN
6.0000 mg | Freq: Once | INTRAVENOUS | Status: AC
  Administered 2015-09-10: 6 mg via INTRAVENOUS
  Filled 2015-09-10: qty 0.6

## 2015-09-10 MED ORDER — FAMOTIDINE IN NACL 20-0.9 MG/50ML-% IV SOLN
INTRAVENOUS | Status: AC
  Filled 2015-09-10: qty 50

## 2015-09-10 NOTE — Patient Instructions (Signed)

## 2015-09-10 NOTE — Progress Notes (Signed)
Medical Oncology  Discussed with Dr Jana Hakim, who suggests holding perjeta x at least 1 cycle until diarrhea resolves, then restarting at 1/2 dose.  Suggests questran or cholestipol if needed in addition to imodium.  L.Diontre Harps MD

## 2015-09-11 ENCOUNTER — Telehealth: Payer: Self-pay | Admitting: *Deleted

## 2015-09-11 ENCOUNTER — Ambulatory Visit (HOSPITAL_COMMUNITY): Payer: Medicaid Other

## 2015-09-11 NOTE — Telephone Encounter (Signed)
CALLED PATIENT TO INFORM OF CT BEING CANCELLED FOR TODAY DUE TO INSURANCE DENIAL, INFORMED THAT THERE WOULD BE PEER TO PEER REVIEW BEFORE THIS COULD BE RESCHEDULED , COMMUNICATED THRU DEAF INTERP., LVM MESSAGE THRU HER

## 2015-09-12 ENCOUNTER — Telehealth: Payer: Self-pay | Admitting: *Deleted

## 2015-09-12 NOTE — Telephone Encounter (Signed)
CALLED PATIENT TO INFORM THAT CT HAS BEEN RESCHEDULED FOR 09-18-15- ARRIVAL TIME -11:45 AM, NPO 4 HRS. PRIOR TO TEST, TEST TO BE DONE @ [facility], LVM THRU INTERP. SERVICE

## 2015-09-16 ENCOUNTER — Other Ambulatory Visit: Payer: Self-pay | Admitting: Oncology

## 2015-09-17 ENCOUNTER — Telehealth: Payer: Self-pay | Admitting: Oncology

## 2015-09-17 ENCOUNTER — Ambulatory Visit (HOSPITAL_BASED_OUTPATIENT_CLINIC_OR_DEPARTMENT_OTHER): Payer: Medicaid Other | Admitting: Nurse Practitioner

## 2015-09-17 ENCOUNTER — Other Ambulatory Visit: Payer: Self-pay

## 2015-09-17 ENCOUNTER — Other Ambulatory Visit (HOSPITAL_BASED_OUTPATIENT_CLINIC_OR_DEPARTMENT_OTHER): Payer: Medicaid Other

## 2015-09-17 ENCOUNTER — Encounter: Payer: Self-pay | Admitting: Oncology

## 2015-09-17 ENCOUNTER — Ambulatory Visit (HOSPITAL_BASED_OUTPATIENT_CLINIC_OR_DEPARTMENT_OTHER): Payer: Medicaid Other | Admitting: Oncology

## 2015-09-17 VITALS — BP 119/77 | HR 94 | Temp 98.3°F | Resp 18 | Ht 67.0 in | Wt 124.9 lb

## 2015-09-17 DIAGNOSIS — E876 Hypokalemia: Secondary | ICD-10-CM

## 2015-09-17 DIAGNOSIS — K219 Gastro-esophageal reflux disease without esophagitis: Secondary | ICD-10-CM

## 2015-09-17 DIAGNOSIS — C50911 Malignant neoplasm of unspecified site of right female breast: Secondary | ICD-10-CM

## 2015-09-17 DIAGNOSIS — G893 Neoplasm related pain (acute) (chronic): Secondary | ICD-10-CM

## 2015-09-17 DIAGNOSIS — E639 Nutritional deficiency, unspecified: Secondary | ICD-10-CM

## 2015-09-17 DIAGNOSIS — D6481 Anemia due to antineoplastic chemotherapy: Secondary | ICD-10-CM | POA: Diagnosis not present

## 2015-09-17 DIAGNOSIS — Z95828 Presence of other vascular implants and grafts: Secondary | ICD-10-CM

## 2015-09-17 DIAGNOSIS — C50919 Malignant neoplasm of unspecified site of unspecified female breast: Secondary | ICD-10-CM

## 2015-09-17 DIAGNOSIS — R110 Nausea: Secondary | ICD-10-CM

## 2015-09-17 DIAGNOSIS — C7951 Secondary malignant neoplasm of bone: Principal | ICD-10-CM

## 2015-09-17 DIAGNOSIS — H905 Unspecified sensorineural hearing loss: Secondary | ICD-10-CM | POA: Diagnosis not present

## 2015-09-17 LAB — COMPREHENSIVE METABOLIC PANEL
ALBUMIN: 3 g/dL — AB (ref 3.5–5.0)
ALK PHOS: 100 U/L (ref 40–150)
ALT: 18 U/L (ref 0–55)
AST: 12 U/L (ref 5–34)
Anion Gap: 9 mEq/L (ref 3–11)
BILIRUBIN TOTAL: 1.05 mg/dL (ref 0.20–1.20)
BUN: 4.6 mg/dL — ABNORMAL LOW (ref 7.0–26.0)
CALCIUM: 8.8 mg/dL (ref 8.4–10.4)
CO2: 24 mEq/L (ref 22–29)
Chloride: 107 mEq/L (ref 98–109)
Creatinine: 0.7 mg/dL (ref 0.6–1.1)
Glucose: 99 mg/dl (ref 70–140)
POTASSIUM: 3.8 meq/L (ref 3.5–5.1)
Sodium: 140 mEq/L (ref 136–145)
Total Protein: 6.5 g/dL (ref 6.4–8.3)

## 2015-09-17 LAB — CBC WITH DIFFERENTIAL/PLATELET
BASO%: 0.5 % (ref 0.0–2.0)
BASOS ABS: 0 10*3/uL (ref 0.0–0.1)
EOS ABS: 0 10*3/uL (ref 0.0–0.5)
EOS%: 0.1 % (ref 0.0–7.0)
HEMATOCRIT: 28.6 % — AB (ref 34.8–46.6)
HEMOGLOBIN: 9.7 g/dL — AB (ref 11.6–15.9)
LYMPH%: 10.4 % — ABNORMAL LOW (ref 14.0–49.7)
MCH: 29.9 pg (ref 25.1–34.0)
MCHC: 33.9 g/dL (ref 31.5–36.0)
MCV: 88.3 fL (ref 79.5–101.0)
MONO#: 0.5 10*3/uL (ref 0.1–0.9)
MONO%: 6.3 % (ref 0.0–14.0)
NEUT#: 7 10*3/uL — ABNORMAL HIGH (ref 1.5–6.5)
NEUT%: 82.7 % — ABNORMAL HIGH (ref 38.4–76.8)
PLATELETS: 242 10*3/uL (ref 145–400)
RBC: 3.24 10*6/uL — ABNORMAL LOW (ref 3.70–5.45)
RDW: 17.5 % — AB (ref 11.2–14.5)
WBC: 8.4 10*3/uL (ref 3.9–10.3)
lymph#: 0.9 10*3/uL (ref 0.9–3.3)

## 2015-09-17 MED ORDER — HEPARIN SOD (PORK) LOCK FLUSH 100 UNIT/ML IV SOLN
500.0000 [IU] | Freq: Once | INTRAVENOUS | Status: AC
  Administered 2015-09-17: 500 [IU] via INTRAVENOUS
  Filled 2015-09-17: qty 5

## 2015-09-17 MED ORDER — DEXAMETHASONE 4 MG PO TABS: ORAL_TABLET | ORAL | 1 refills | Status: DC

## 2015-09-17 MED ORDER — SODIUM CHLORIDE 0.9% FLUSH
10.0000 mL | Freq: Once | INTRAVENOUS | Status: AC
  Administered 2015-09-17: 10 mL via INTRAVENOUS
  Filled 2015-09-17: qty 10

## 2015-09-17 NOTE — Telephone Encounter (Signed)
appt made and avs printed °

## 2015-09-17 NOTE — Progress Notes (Signed)
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 She has had orthopedic fixation of left femur impending pathogic fracture + radiation there, 2 cycles taxotere herceptin perjeta thru 08-30-15, and continues IV bisphosphonate most recently zometa on 09-06-15. She saw Dr Sondra Come on 09-05-15, considering radiation to C and upper T spine, where patient was complaining of increased pain at that visit; radiation plans pending CT head 09-18-15. NOTE patient reports pain in neck and upper back now much improved.   Zahirah is feeling quite a bit better today. Diarrhea,  likely from [place], resolved several days ago; patient has had one normal formed stool each of last 2 days. Appetite is better tho po intake not optimal, was able to eat a hamburger and [place] food, drinking [employer] and "[employer]" chocolate milk, has still not gotten [employer]. No nausea or vomiting. Complains of head "tightness" and possibly scalp tenderness as can be experienced with chemo hair loss, not clearly HA but has not had head imaging and needs head CT planned 09-18-15. She is taking hydrocodone APAP now at hs, very helpful including able to sleep ~ 10 hrs last pm, denies needing pain meds during day. Only pain now is left upper femur "rod hurts when rainy". She is mostly on couch during day, but is up and down. She has slight clear rhinorrhea and slight cough with this, no SOB, chest pain, fever. No bleeding. No other symptoms of infection. No problems with PAC. Remainder of 10 point Review of Systems negative / unchanged.    PAC by IR 08-06-15 Feraheme 06-29-15 and 07-22-15 Genetics testing  negative by Breast Ovarian panel 03-2015 [facility] 03-15-15 heterogeneously dense tissue otherwise negative. CA 2729 on 06-26-15: 72.5    ONCOLOGIC HISTORY Patient had 1.4 cm poorly differentiated right breast carcinoma diagnosed in Nov 1999 at age 50, 1/7 axillary nodes involved, ER/PR and HER 2 positive. She had mastectomy with the 7 axillary node evaluation, 4 cycles of adriamycin/ cytoxan followed by taxotere, then five years of tamoxifen thru June 2005 (no herceptin in 1999). She did not have local radiation. She had right TRAM reconstruction. She had bilateral salpingoophorectomy in May 2005. She was briefly on aromatase inhibitor after tamoxifen, but discontinued this herself due to poor tolerance. Most recent left mammogram was done as screening on 04-15-2012 with heterogeneously dense tissue and questioned asymmetry, followed by diagnostic left mamogram on 04-28-12 which had no findings of concern. 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 She had nailing of impending left femur fracture by Dr [name] on 06-26-15. Pathology [id number] from 06-26-15 showed metastatic adenocarcinoma , strongly ER positive 95%, PR positive 2%, HER 2 positive 3+. Plain xrays suggested T1 and left 4th rib involvement, tho these also did not show on bone scan 07-01-15; like unremarkable CT, bone scan showed only activity in left femur area of surgery. PET 07-19-15 showed extensive bone involvement axial and proximal appendicular skeleton, "severe multifocal osseous metastatic disease, much greater than anticipated based on prior imaging", involved nodes right subpectoral and right internal mammary, and no distant extraosseous mets.  She had  radiation to left femur 30 cGy completed 07-26-15. She continued IV bisphosphonate with zometa on 08-07-15 and had first taxotere herceptin perjeta on  08-09-15.   Objective:  Vital signs in last 24 hours:  BP 119/77 (BP Location: Left Arm, Patient Position: Sitting)   Pulse 94   Temp 98.3 F (36.8 C) (Oral)   Resp 18   Ht 5' 7"  (1.702 m)   Wt 124 lb 14.4 oz (56.7 kg)   SpO2 100%   BMI 19.56 kg/m  Looks much more comfortable today, best that I have seen her. Weight down another 4 lbs. Respirations not labored walking RN. Slight cough x1 during visit.  Alert, oriented and appropriate. Ambulatory more easily and without assistance  Partial alopecia  HEENT:PERRL, sclerae not icteric. Oral mucosa moist without lesions, posterior pharynx dull erythema without exudate consistent with post nasal drainage.  Nasal turbinates not boggy, no purulent drainage. Small areas of hair loss on scalp with hair buzzed, no folliculitis Neck supple. No JVD.  Lymphatics:no cervical,supraclavicular adenopathy Resp: clear to auscultation bilaterally and normal percussion bilaterally Cardio: regular rate and rhythm. No gallop. GI: soft, nontender, not distended, no mass or organomegaly. Normally active bowel sounds.  Musculoskeletal/ Extremities: LE without pitting edema, cords, tenderness. No swelling UE. C and upper T spine not point tender. Standing more straight and ambulating more easily including LLE Neuro: Congenital deafness. no change peripheral neuropathy. Otherwise nonfocal Skin without rash, ecchymosis, petechiae Breast exam not repeated Portacath-without erythema or tenderness  Lab Results:  Results for orders placed or performed in visit on 09/17/15  CBC with Differential  Result Value Ref Range   WBC 8.4 3.9 - 10.3 10e3/uL   NEUT# 7.0 (H) 1.5 - 6.5 10e3/uL   HGB 9.7 (L) 11.6 - 15.9 g/dL   HCT 28.6 (L) 34.8 - 46.6 %   Platelets 242 145 - 400 10e3/uL   MCV 88.3 79.5 - 101.0 fL   MCH 29.9 25.1 - 34.0 pg   MCHC 33.9 31.5 - 36.0 g/dL   RBC 3.24 (L) 3.70 - 5.45 10e6/uL   RDW 17.5 (H) 11.2 - 14.5 %   lymph# 0.9 0.9 - 3.3 10e3/uL    MONO# 0.5 0.1 - 0.9 10e3/uL   Eosinophils Absolute 0.0 0.0 - 0.5 10e3/uL   Basophils Absolute 0.0 0.0 - 0.1 10e3/uL   NEUT% 82.7 (H) 38.4 - 76.8 %   LYMPH% 10.4 (L) 14.0 - 49.7 %   MONO% 6.3 0.0 - 14.0 %   EOS% 0.1 0.0 - 7.0 %   BASO% 0.5 0.0 - 2.0 %  Comprehensive metabolic panel  Result Value Ref Range   Sodium 140 136 - 145 mEq/L   Potassium 3.8 3.5 - 5.1 mEq/L   Chloride 107 98 - 109 mEq/L   CO2 24 22 - 29 mEq/L   Glucose 99 70 - 140 mg/dl   BUN 4.6 (L) 7.0 - 26.0 mg/dL   Creatinine 0.7 0.6 - 1.1 mg/dL   Total Bilirubin 1.05 0.20 - 1.20 mg/dL   Alkaline Phosphatase 100 40 - 150 U/L   AST 12 5 - 34 U/L   ALT 18 0 - 55 U/L   Total Protein 6.5 6.4 - 8.3 g/dL   Albumin 3.0 (L) 3.5 - 5.0 g/dL   Calcium 8.8 8.4 - 10.4 mg/dL   Anion Gap 9 3 - 11 mEq/L   EGFR >90 >90 ml/min/1.73 m2     Studies/Results:  No results found.   CT head pending 09-18-15  Medications:  I have reviewed the patient's current medications. Will hold Perjeta next treatment 9-14, then resume at 1/2 dose with the following treatment.  Continue hydrocodone at hs as needed. Decrease KCl to 10 mEq = 1 tab daily  DISCUSSION All of interval history discussed.  Will follow up results of head CT on 09-18-15; I will let Dr Sondra Come know that neck and upper back pain is improved. Patient would prefer not to have RT if not necessary. If radiation is given (and is begun prior to next treatment 9-14) will give herceptin only on 9-14. If no radiation (or not to start until later than 9-14) will give taxotere + herceptin on 9-14.  We will need to let patient know systemic treatment plans after head CT/ after RT decided.   Assessment/Plan: 1. Metastatic ER+, HER 2 + breast cancer extensively involving axial and appendicular skeleton: history of T1N1 right breast carcinoma age 59, ER/PR +, HER 2 + inNov 1999, no HER 2 therapy in 1999. Post nailing of impending fracture left femur by Dr [name] 06-26-15 and local radiation.  FirstHerceptin, Perjeta and taxotere 08-09-15, with on pro neulasta; cycle 2 given 08-30-15, zometa on 09-06-15 and q 4 weeks.  For head CT 09-18-15; if negative, the complaints of head soreness may be related to scalp with chemo hair loss occurring. Plan next systemic treatment 09-26-15. Will need to hold concomitant taxotere if radiation given to C- T-spine and/or head , can give Herceptin. Will hold Perjeta next treatment due to diarrhea, resume at 1/2 dose cycle 4. 2. Echocardiogram 08-05-15 with EF 60-65% , prior adriamycin < 300 mg/m2, no chest radiation. 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 Husband also deaf. All communication needs to be very clear 4.positional vertigo: followed by neurology. Note she does not tolerate lying on stomach due to vertigo 5.post laparoscopic cholecystectomy 2004 6.right TRAM with original breast cancer 1999 7.premature surgical menopause, post BSO 2073fr pelvic pain and ovarian cysts.7. 8..claustrophobia: required full sedation for the hip MRI. 9. Marked iron deficiency anemia: feraheme given 06-29-15 and 07-22-15. Chemo anemia now, not symptomatic to require transfusion 10. PAC placed by IR 08-06-15. Peripheral IV access limited to LUE. 11.Possible HA with zofran, tho we had previously thought this was not the case. Antiemetics unfortunately very limited by med intolerances, but should be able to add aloxi next taxotere treatment if avoiding zofran. Note she has refused to use ativan SL, discussed.  12.diarrhea likely from perjeta as C diff negative. Resolved. Plan for perjeta as above 13.poor nutritional status and poor po intake, worse since most recent treatment.  Encouraged to try [employer] in her chocolate milk. CWaldodietician updated 14.has been given information on advance directives.  15..hypokalemia likely from diarrhea. Improved, decrease KCL to 10 mEq daily and  follow 16.continue pepcid daily for GERD and nausea. 17.likely environmental allergies, no obvious respiratory infection   All questions answered. Time spent 30 min including >50% counseling and coordination of care. CC Dr [name] LEvlyn Clines MD   09/18/2015, 10:07 AM

## 2015-09-17 NOTE — Patient Instructions (Signed)

## 2015-09-18 ENCOUNTER — Ambulatory Visit (HOSPITAL_BASED_OUTPATIENT_CLINIC_OR_DEPARTMENT_OTHER): Payer: Medicaid Other

## 2015-09-18 ENCOUNTER — Other Ambulatory Visit: Payer: Self-pay

## 2015-09-18 ENCOUNTER — Ambulatory Visit (HOSPITAL_COMMUNITY)
Admission: RE | Admit: 2015-09-18 | Discharge: 2015-09-18 | Disposition: A | Discharge status: Home / Self Care | Payer: Medicaid Other | Source: Ambulatory Visit | Attending: Radiation Oncology | Admitting: Radiation Oncology

## 2015-09-18 ENCOUNTER — Encounter (HOSPITAL_COMMUNITY): Payer: Self-pay

## 2015-09-18 DIAGNOSIS — C50911 Malignant neoplasm of unspecified site of right female breast: Secondary | ICD-10-CM | POA: Diagnosis present

## 2015-09-18 DIAGNOSIS — C7951 Secondary malignant neoplasm of bone: Secondary | ICD-10-CM | POA: Insufficient documentation

## 2015-09-18 DIAGNOSIS — C799 Secondary malignant neoplasm of unspecified site: Secondary | ICD-10-CM

## 2015-09-18 DIAGNOSIS — Z452 Encounter for adjustment and management of vascular access device: Secondary | ICD-10-CM | POA: Diagnosis present

## 2015-09-18 DIAGNOSIS — R9082 White matter disease, unspecified: Secondary | ICD-10-CM | POA: Insufficient documentation

## 2015-09-18 DIAGNOSIS — C50919 Malignant neoplasm of unspecified site of unspecified female breast: Secondary | ICD-10-CM

## 2015-09-18 MED ORDER — SODIUM CHLORIDE 0.9% FLUSH
3.0000 mL | Freq: Once | INTRAVENOUS | Status: AC | PRN
  Administered 2015-09-18: 10 mL via INTRAVENOUS
  Filled 2015-09-18: qty 10

## 2015-09-18 MED ORDER — FAMOTIDINE 20 MG PO TABS: 20.0000 mg | ORAL_TABLET | Freq: Two times a day (BID) | ORAL | 1 refills | Status: DC

## 2015-09-18 MED ORDER — IOPAMIDOL (ISOVUE-300) INJECTION 61%
75.0000 mL | Freq: Once | INTRAVENOUS | Status: AC | PRN
  Administered 2015-09-18: 75 mL via INTRAVENOUS

## 2015-09-23 ENCOUNTER — Telehealth: Payer: Self-pay | Admitting: Oncology

## 2015-09-23 NOTE — Telephone Encounter (Addendum)
Left a voicemail message for [name] through a sign language interpreter advising her that the CT of her head from 09/18/15 was good per Dr. Sondra Come.  Requested a return call to see if she is still having pain in her lower neck and upper chest.

## 2015-09-25 ENCOUNTER — Encounter: Payer: Self-pay | Admitting: Oncology

## 2015-09-25 ENCOUNTER — Other Ambulatory Visit: Payer: Self-pay | Admitting: Oncology

## 2015-09-25 NOTE — Progress Notes (Signed)
Medical Oncology  Patient for treatment on 09-26-15.  CT head 09-18-15 negative for metastatic disease to brain (or skull).  No radiation scheduled. Phone message from rad onc 09-23-15 requesting patient let them know how neck and chest pain are; no reply from patient documented in EMR  At my last visit on 09-17-15, patient denied pain in those areas.  As no radiation set up as yet, plan to give taxotere and herceptin on 09-26-15. Perjeta will be held this cycle due to previous diarrhea, then resume at 50% dose with next cycle. Asked infusion RN to let MD know if patient did not take premedication decadron for taxotere as instructed previously.  She will have IVF on 09-27-15 and see this MD + IVF on 09-30-15. On pro neulasta with taxotere.  Godfrey Pick, MD

## 2015-09-26 ENCOUNTER — Telehealth: Payer: Self-pay | Admitting: Oncology

## 2015-09-26 ENCOUNTER — Ambulatory Visit (HOSPITAL_BASED_OUTPATIENT_CLINIC_OR_DEPARTMENT_OTHER): Payer: Medicaid Other

## 2015-09-26 ENCOUNTER — Other Ambulatory Visit (HOSPITAL_BASED_OUTPATIENT_CLINIC_OR_DEPARTMENT_OTHER): Payer: Medicaid Other

## 2015-09-26 ENCOUNTER — Encounter: Payer: Medicaid Other | Admitting: Nutrition

## 2015-09-26 ENCOUNTER — Ambulatory Visit: Payer: Medicaid Other

## 2015-09-26 VITALS — BP 110/67 | HR 76 | Temp 97.9°F | Resp 18

## 2015-09-26 DIAGNOSIS — C50911 Malignant neoplasm of unspecified site of right female breast: Secondary | ICD-10-CM | POA: Diagnosis not present

## 2015-09-26 DIAGNOSIS — Z5112 Encounter for antineoplastic immunotherapy: Secondary | ICD-10-CM | POA: Diagnosis not present

## 2015-09-26 DIAGNOSIS — C7951 Secondary malignant neoplasm of bone: Secondary | ICD-10-CM | POA: Diagnosis not present

## 2015-09-26 DIAGNOSIS — Z5111 Encounter for antineoplastic chemotherapy: Secondary | ICD-10-CM | POA: Diagnosis not present

## 2015-09-26 DIAGNOSIS — Z95828 Presence of other vascular implants and grafts: Secondary | ICD-10-CM

## 2015-09-26 LAB — COMPREHENSIVE METABOLIC PANEL
ALBUMIN: 2.8 g/dL — AB (ref 3.5–5.0)
ALT: 11 U/L (ref 0–55)
AST: 9 U/L (ref 5–34)
Alkaline Phosphatase: 80 U/L (ref 40–150)
Anion Gap: 8 mEq/L (ref 3–11)
BUN: 8.1 mg/dL (ref 7.0–26.0)
CALCIUM: 8.7 mg/dL (ref 8.4–10.4)
CO2: 25 mEq/L (ref 22–29)
Chloride: 109 mEq/L (ref 98–109)
Creatinine: 0.7 mg/dL (ref 0.6–1.1)
EGFR: >90 mL/min/{1.73_m2} (ref >90)
Glucose: 83 mg/dl (ref 70–140)
POTASSIUM: 4 meq/L (ref 3.5–5.1)
Sodium: 142 mEq/L (ref 136–145)
Total Bilirubin: 0.53 mg/dL (ref 0.20–1.20)
Total Protein: 5.9 g/dL — ABNORMAL LOW (ref 6.4–8.3)

## 2015-09-26 LAB — CBC WITH DIFFERENTIAL/PLATELET
BASO%: 0.9 % (ref 0.0–2.0)
BASOS ABS: 0 10*3/uL (ref 0.0–0.1)
EOS ABS: 0.2 10*3/uL (ref 0.0–0.5)
EOS%: 5.3 % (ref 0.0–7.0)
HEMATOCRIT: 25.7 % — AB (ref 34.8–46.6)
HEMOGLOBIN: 8.6 g/dL — AB (ref 11.6–15.9)
LYMPH#: 0.8 10*3/uL — AB (ref 0.9–3.3)
LYMPH%: 26.5 % (ref 14.0–49.7)
MCH: 29.7 pg (ref 25.1–34.0)
MCHC: 33.3 g/dL (ref 31.5–36.0)
MCV: 89.2 fL (ref 79.5–101.0)
MONO#: 0.3 10*3/uL (ref 0.1–0.9)
MONO%: 10.9 % (ref 0.0–14.0)
NEUT#: 1.7 10*3/uL (ref 1.5–6.5)
NEUT%: 56.4 % (ref 38.4–76.8)
Platelets: 288 10*3/uL (ref 145–400)
RBC: 2.88 10*6/uL — ABNORMAL LOW (ref 3.70–5.45)
RDW: 17.6 % — AB (ref 11.2–14.5)
WBC: 2.9 10*3/uL — ABNORMAL LOW (ref 3.9–10.3)

## 2015-09-26 MED ORDER — PEGFILGRASTIM 6 MG/0.6ML ~~LOC~~ PSKT
6.0000 mg | PREFILLED_SYRINGE | Freq: Once | SUBCUTANEOUS | Status: AC
  Administered 2015-09-26: 6 mg via SUBCUTANEOUS
  Filled 2015-09-26: qty 0.6

## 2015-09-26 MED ORDER — LORAZEPAM 2 MG/ML IJ SOLN
INTRAMUSCULAR | Status: AC
  Filled 2015-09-26: qty 1

## 2015-09-26 MED ORDER — SODIUM CHLORIDE 0.9 % IJ SOLN
10.0000 mL | INTRAMUSCULAR | Status: DC | PRN
  Administered 2015-09-26: 10 mL via INTRAVENOUS
  Filled 2015-09-26: qty 10

## 2015-09-26 MED ORDER — TRASTUZUMAB CHEMO 150 MG IV SOLR
6.0000 mg/kg | Freq: Once | INTRAVENOUS | Status: AC
  Administered 2015-09-26: 357 mg via INTRAVENOUS
  Filled 2015-09-26: qty 17

## 2015-09-26 MED ORDER — LORAZEPAM 2 MG/ML IJ SOLN: 1.0000 mg | Freq: Once | INTRAMUSCULAR | Status: DC | PRN

## 2015-09-26 MED ORDER — SODIUM CHLORIDE 0.9 % IV SOLN
Freq: Once | INTRAVENOUS | Status: AC
  Administered 2015-09-26: 14:00:00 via INTRAVENOUS
  Filled 2015-09-26: qty 5

## 2015-09-26 MED ORDER — HEPARIN SOD (PORK) LOCK FLUSH 100 UNIT/ML IV SOLN
500.0000 [IU] | Freq: Once | INTRAVENOUS | Status: AC | PRN
  Administered 2015-09-26: 500 [IU]
  Filled 2015-09-26: qty 5

## 2015-09-26 MED ORDER — ACETAMINOPHEN 325 MG PO TABS
ORAL_TABLET | ORAL | Status: AC
  Filled 2015-09-26: qty 2

## 2015-09-26 MED ORDER — ACETAMINOPHEN 325 MG PO TABS
650.0000 mg | ORAL_TABLET | Freq: Once | ORAL | Status: AC
  Administered 2015-09-26: 650 mg via ORAL

## 2015-09-26 MED ORDER — SODIUM CHLORIDE 0.9% FLUSH
10.0000 mL | INTRAVENOUS | Status: DC | PRN
  Administered 2015-09-26: 10 mL
  Filled 2015-09-26: qty 10

## 2015-09-26 MED ORDER — DOCETAXEL CHEMO INJECTION 160 MG/16ML
75.0000 mg/m2 | Freq: Once | INTRAVENOUS | Status: AC
  Administered 2015-09-26: 130 mg via INTRAVENOUS
  Filled 2015-09-26: qty 13

## 2015-09-26 MED ORDER — LORATADINE 10 MG PO TABS
10.0000 mg | ORAL_TABLET | Freq: Every day | ORAL | Status: DC
  Administered 2015-09-26: 10 mg via ORAL
  Filled 2015-09-26: qty 1

## 2015-09-26 MED ORDER — ONDANSETRON HCL 40 MG/20ML IJ SOLN
Freq: Once | INTRAMUSCULAR | Status: AC
  Administered 2015-09-26: 15:00:00 via INTRAVENOUS
  Filled 2015-09-26: qty 4

## 2015-09-26 MED ORDER — SODIUM CHLORIDE 0.9 % IV SOLN
Freq: Once | INTRAVENOUS | Status: AC
  Administered 2015-09-26: 14:00:00 via INTRAVENOUS

## 2015-09-26 NOTE — Progress Notes (Signed)
Pt declined ATivan today - reports nausea is ok and she "doesn't want to be anymore drowsy". She also stated she has no pain now except for intermittent hip pain when she is cold.

## 2015-09-26 NOTE — Telephone Encounter (Signed)
Left another message with the sign language interpreter asking if [name] is having pain in her lower neck/upper chest.  Requested a return call.

## 2015-09-26 NOTE — Patient Instructions (Addendum)
TAKE DEXAMETHASONE TABLETS STARTING TONIGHT - 2 TABLETS TONIGHT THEN 2 IN THE MORNING THEN 2 AT NIGHT AND THEN THE SAME THE NEXT DAY.  PLEASE TAKE WITH MEALS.  [facility] Discharge Instructions for Patients Receiving Chemotherapy  Today you received the following chemotherapy agents Taxotere Herceptin.  To help prevent nausea and vomiting after your treatment, we encourage you to take your nausea medication as directed. If you develop nausea and vomiting that is not controlled by your nausea medication, call the clinic.   BELOW ARE SYMPTOMS THAT SHOULD BE REPORTED IMMEDIATELY:  *FEVER GREATER THAN 100.5 F  *CHILLS WITH OR WITHOUT FEVER  NAUSEA AND VOMITING THAT IS NOT CONTROLLED WITH YOUR NAUSEA MEDICATION  *UNUSUAL SHORTNESS OF BREATH  *UNUSUAL BRUISING OR BLEEDING  TENDERNESS IN MOUTH AND THROAT WITH OR WITHOUT PRESENCE OF ULCERS  *URINARY PROBLEMS  *BOWEL PROBLEMS  UNUSUAL RASH Items with * indicate a potential emergency and should be followed up as soon as possible.  Feel free to call the clinic you have any questions or concerns. The clinic phone number is [phone].  Please show the [place] at check-in to the Emergency Department and triage nurse.

## 2015-09-27 ENCOUNTER — Other Ambulatory Visit: Payer: Self-pay | Admitting: Oncology

## 2015-09-27 ENCOUNTER — Ambulatory Visit: Payer: Medicaid Other | Admitting: Nutrition

## 2015-09-27 ENCOUNTER — Encounter: Payer: Medicaid Other | Admitting: Nutrition

## 2015-09-27 ENCOUNTER — Ambulatory Visit (HOSPITAL_BASED_OUTPATIENT_CLINIC_OR_DEPARTMENT_OTHER): Payer: Medicaid Other

## 2015-09-27 VITALS — BP 103/62 | HR 94 | Temp 97.7°F | Resp 18

## 2015-09-27 DIAGNOSIS — C50919 Malignant neoplasm of unspecified site of unspecified female breast: Secondary | ICD-10-CM

## 2015-09-27 DIAGNOSIS — C7951 Secondary malignant neoplasm of bone: Secondary | ICD-10-CM | POA: Diagnosis not present

## 2015-09-27 DIAGNOSIS — R197 Diarrhea, unspecified: Secondary | ICD-10-CM | POA: Diagnosis not present

## 2015-09-27 DIAGNOSIS — C50911 Malignant neoplasm of unspecified site of right female breast: Secondary | ICD-10-CM

## 2015-09-27 MED ORDER — SODIUM CHLORIDE 0.9 % IV SOLN
1000.0000 mL | Freq: Once | INTRAVENOUS | Status: AC
  Administered 2015-09-27: 1000 mL via INTRAVENOUS

## 2015-09-27 MED ORDER — SODIUM CHLORIDE 0.9% FLUSH
3.0000 mL | Freq: Once | INTRAVENOUS | Status: AC | PRN
Start: 2015-09-27 — End: 2015-09-27
  Administered 2015-09-27: 3 mL via INTRAVENOUS
  Filled 2015-09-27: qty 10

## 2015-09-27 MED ORDER — HEPARIN SOD (PORK) LOCK FLUSH 100 UNIT/ML IV SOLN
250.0000 [IU] | Freq: Once | INTRAVENOUS | Status: AC | PRN
  Administered 2015-09-27: 250 [IU]
  Filled 2015-09-27: qty 5

## 2015-09-27 NOTE — Progress Notes (Signed)
Nutrition follow-up completed with patient during IV fluids.  She has been diagnosed with metastatic breast cancer.  Patient was seen with interpreter. Patient's weight has decreased to 124.9 pounds September 5, down from 131.6 pounds August 17. Patient continues to complain of metallic taste. She has finally tried [employer] and enjoys it very much. Patient has not tried salt and baking soda rinses.  Nutrition diagnosis: Unintended weight loss continues.  Intervention:  Patient was educated again on strategies for improving taste alterations, including baking soda and salt water rinses. Educated patient to continue [employer] essentials and provided coupons. Questions were answered.  Teach back method used.  Monitoring, evaluation, goals: Patient will tolerate increased calories and protein to minimize weight loss.  Next visit: To be scheduled as needed.

## 2015-09-27 NOTE — Patient Instructions (Signed)

## 2015-09-27 NOTE — Progress Notes (Signed)
Pt request to have bed next time d/t discomfort from metal rod in L hip following procedure. Pt stated that she has a difficult time sitting up for an extended period of time b/c the site hasn't healed yet. Pt also informed by her PCP that it is time for her tetanus shot to be updated. An in-basket was placed to Dr. Marko Plume to verify that it would be okay for pt to receive shot without compromising effectiveness or interacting with home medications and chemotherapy. Pt VSS and asmyptomatic post IVF infusion. Printed labs and AVS.

## 2015-09-29 ENCOUNTER — Other Ambulatory Visit: Payer: Self-pay | Admitting: Oncology

## 2015-09-29 DIAGNOSIS — C7951 Secondary malignant neoplasm of bone: Principal | ICD-10-CM

## 2015-09-29 DIAGNOSIS — C50911 Malignant neoplasm of unspecified site of right female breast: Secondary | ICD-10-CM

## 2015-09-29 MED ORDER — SODIUM CHLORIDE 0.9 % IV SOLN: INTRAVENOUS | Status: DC

## 2015-09-29 MED ORDER — FAMOTIDINE IN NACL 20-0.9 MG/50ML-% IV SOLN: 20.0000 mg | Freq: Once | INTRAVENOUS | Status: DC

## 2015-09-30 ENCOUNTER — Ambulatory Visit: Payer: Medicaid Other

## 2015-09-30 ENCOUNTER — Other Ambulatory Visit: Payer: Medicaid Other

## 2015-09-30 ENCOUNTER — Encounter: Payer: Self-pay | Admitting: Oncology

## 2015-09-30 ENCOUNTER — Ambulatory Visit (HOSPITAL_BASED_OUTPATIENT_CLINIC_OR_DEPARTMENT_OTHER): Payer: Medicaid Other | Admitting: Oncology

## 2015-09-30 VITALS — BP 105/56 | HR 80 | Temp 98.0°F | Resp 18 | Ht 67.0 in | Wt 129.2 lb

## 2015-09-30 DIAGNOSIS — C50911 Malignant neoplasm of unspecified site of right female breast: Secondary | ICD-10-CM | POA: Diagnosis not present

## 2015-09-30 DIAGNOSIS — H905 Unspecified sensorineural hearing loss: Secondary | ICD-10-CM | POA: Diagnosis not present

## 2015-09-30 DIAGNOSIS — D6481 Anemia due to antineoplastic chemotherapy: Secondary | ICD-10-CM | POA: Diagnosis not present

## 2015-09-30 DIAGNOSIS — K219 Gastro-esophageal reflux disease without esophagitis: Secondary | ICD-10-CM

## 2015-09-30 DIAGNOSIS — R110 Nausea: Secondary | ICD-10-CM | POA: Diagnosis not present

## 2015-09-30 DIAGNOSIS — D509 Iron deficiency anemia, unspecified: Secondary | ICD-10-CM | POA: Diagnosis not present

## 2015-09-30 DIAGNOSIS — C7951 Secondary malignant neoplasm of bone: Secondary | ICD-10-CM

## 2015-09-30 DIAGNOSIS — Z95828 Presence of other vascular implants and grafts: Secondary | ICD-10-CM

## 2015-09-30 DIAGNOSIS — Z5111 Encounter for antineoplastic chemotherapy: Secondary | ICD-10-CM

## 2015-09-30 DIAGNOSIS — C50919 Malignant neoplasm of unspecified site of unspecified female breast: Secondary | ICD-10-CM

## 2015-09-30 DIAGNOSIS — G893 Neoplasm related pain (acute) (chronic): Secondary | ICD-10-CM

## 2015-09-30 DIAGNOSIS — E876 Hypokalemia: Secondary | ICD-10-CM

## 2015-09-30 DIAGNOSIS — E639 Nutritional deficiency, unspecified: Secondary | ICD-10-CM

## 2015-09-30 NOTE — Progress Notes (Signed)
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 She had orthopedic surgery for impending fracture left femur 06-26-15, radiation to LLE, first taxotere/ herceptin/ perjeta on 08-09-15 and monthly zometa.  NOTE CTs have not imaged adequately; MRI and PET much more sensitive for her disease. CT head 09-18-15 no metastatic disease.  Last zometa 09-06-15.  Because of diarrhea with first 2 cycles of perjeta, cycle 3 on 09-26-15 was taxotere and herceptin; plan is to resume perjeta at 50% dosing with cycle 4. Patient has tolerated cycle 3 much better as she has not had diarrhea. Bowels are moving with formed stool 2x daily. She has had no vomiting. She denies any pain other than mild discomfort now in left hip and thigh, specifically no pain neck, upper back or chest. She is walking more easily and more active at home. She likes [employer], drinking 2-3 daily, eating some more also. She is sleeping >9 hours nightly. She denies SOB, bleeding, fever or symptoms of infection. No problems with PAC. No swelling LE. No complaints of nail changes.  Remainder of 10 point Review of Systems negative.     PAC by IR 08-06-15 Feraheme 06-29-15 and 07-22-15 Genetics testing negative by Breast Ovarian panel 03-2015 [facility] 03-15-15 heterogeneously dense tissue otherwise negative. CA 2729 on 06-26-15: 72.5    ONCOLOGIC HISTORY Patient had 1.4 cm poorly differentiated right breast carcinoma diagnosed in Nov 1999 at age 50, 1/7 axillary nodes involved, ER/PR and HER 2 positive. She had mastectomy with the 7 axillary node evaluation,  4 cycles of adriamycin/ cytoxan followed by taxotere, then five years of tamoxifen thru June 2005 (no herceptin in 1999). She did not have local radiation. She had right TRAM reconstruction. She had bilateral salpingoophorectomy in May 2005. She was briefly on aromatase inhibitor after tamoxifen, but discontinued this herself due to poor tolerance. Most recent left mammogram was done as screening on 04-15-2012 with heterogeneously dense tissue and questioned asymmetry, followed by diagnostic left mamogram on 04-28-12 which had no findings of concern. 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 She had nailing of impending left femur fracture by Dr [name] on 06-26-15. Pathology [id number] from 06-26-15 showed metastatic adenocarcinoma , strongly ER positive 95%, PR positive 2%, HER 2 positive 3+. Plain xrays suggested T1 and left 4th rib involvement, tho these also did not show on bone scan 07-01-15; like unremarkable CT, bone scan showed only activity in left femur area of surgery. PET 07-19-15 showed extensive bone involvement axial and proximal appendicular skeleton, "severe multifocal osseous metastatic disease, much greater than anticipated based on prior imaging", involved nodes right subpectoral and right internal mammary, and no distant extraosseous mets.  She had radiation to left femur 30 cGy completed 07-26-15. She continued IV bisphosphonate with zometa on 08-07-15 and had first taxotere herceptin perjeta on 08-09-15. Perjeta held cycle 3 after diarrhea cycles 1 and 2.    Objective:  Vital signs in last 24 hours:  BP (!) 105/56 (BP Location: Right Arm, Patient Position: Sitting)   Pulse 80   Temp 98 F (36.7  C) (Oral)   Resp 18   Ht 5' 7"  (1.702 m)   Wt 129 lb 3.2 oz (58.6 kg)   SpO2 100%   BMI 20.24 kg/m  Weight up 5 lbs. Looks the best that I have seen her. Communicates easily  with assistance of sign language interpreter. Alert, oriented and appropriate. Ambulatory without assistance, much more easily.  Alopecia  HEENT:PERRL, sclerae not icteric. Oral mucosa moist without lesions, posterior pharynx clear. No tearing. Neck supple. No JVD.  Lymphatics:no cervical,supraclavicular, axillary adenopathy Resp: clear to auscultation bilaterally and normal percussion bilaterally Cardio: regular rate and rhythm. No gallop. GI: soft, nontender, not distended, no mass or organomegaly. Normally active bowel sounds.  Musculoskeletal/ Extremities: UE/ LE without pitting edema, cords, tenderness Neuro: no peripheral neuropathy. Otherwise nonfocal.  Skin without rash, ecchymosis, petechiae Portacath-without erythema or tenderness  Lab Results:  Results for orders placed or performed in visit on 09/26/15  CBC with Differential  Result Value Ref Range   WBC 2.9 (L) 3.9 - 10.3 10e3/uL   NEUT# 1.7 1.5 - 6.5 10e3/uL   HGB 8.6 (L) 11.6 - 15.9 g/dL   HCT 25.7 (L) 34.8 - 46.6 %   Platelets 288 145 - 400 10e3/uL   MCV 89.2 79.5 - 101.0 fL   MCH 29.7 25.1 - 34.0 pg   MCHC 33.3 31.5 - 36.0 g/dL   RBC 2.88 (L) 3.70 - 5.45 10e6/uL   RDW 17.6 (H) 11.2 - 14.5 %   lymph# 0.8 (L) 0.9 - 3.3 10e3/uL   MONO# 0.3 0.1 - 0.9 10e3/uL   Eosinophils Absolute 0.2 0.0 - 0.5 10e3/uL   Basophils Absolute 0.0 0.0 - 0.1 10e3/uL   NEUT% 56.4 38.4 - 76.8 %   LYMPH% 26.5 14.0 - 49.7 %   MONO% 10.9 0.0 - 14.0 %   EOS% 5.3 0.0 - 7.0 %   BASO% 0.9 0.0 - 2.0 %  Comprehensive metabolic panel  Result Value Ref Range   Sodium 142 136 - 145 mEq/L   Potassium 4.0 3.5 - 5.1 mEq/L   Chloride 109 98 - 109 mEq/L   CO2 25 22 - 29 mEq/L   Glucose 83 70 - 140 mg/dl   BUN 8.1 7.0 - 26.0 mg/dL   Creatinine 0.7 0.6 - 1.1 mg/dL   Total Bilirubin 0.53 0.20 - 1.20 mg/dL   Alkaline Phosphatase 80 40 - 150 U/L   AST 9 5 - 34 U/L   ALT 11 0 - 55 U/L   Total Protein 5.9 (L) 6.4 - 8.3 g/dL   Albumin 2.8 (L) 3.5 -  5.0 g/dL   Calcium 8.7 8.4 - 10.4 mg/dL   Anion Gap 8 3 - 11 mEq/L   EGFR >90 >90 ml/min/1.73 m2   CA 2729 on 08-29-15   109, will repeat with next labs  Studies/Results:  No results found.  Medications: I have reviewed the patient's current medications. I have recommended flu vaccine, which patient initially refuses but does agree to consider following discussion. Would not give this on day of chemo or when on steroids.  Reviewed dosing of decadron 8 mg bid with food x 3 days beginning day prior to each taxotere, as patient did not remember to take this starting day prior to cycle 3.  DISCUSSION We are all pleased that she is feeling so much better than at presentation with metastatic disease. She understands that diarrhea cycles 2 and 3 was from perjeta, but that this drug is still important for treating the  metastatic cancer and will be resumed at lower dose next cycle.   Medications as above including steroids and flu vaccine  Encouraged at least 3 of the [employer] drinks daily.   Explained that is any concerns about function of on pro neulasta that she should be in touch with this office promptly.   I will let radiation oncology know that previous pain has resolved, does not appear that additional radiation needed now, tho we appreciate their help.    Assessment/Plan:  1. Metastatic ER+, HER 2 + breast cancer extensively involving axial and appendicular skeleton: history of T1N1 right breast carcinoma age 24, ER/PR +, HER 2 + inNov 1999, no HER 2 therapy in 1999. Post nailing of impending fracture left femur by Dr [name] 06-26-15 and localradiation. FirstHerceptin, Perjeta and taxotere 08-09-15, with on pro neulasta; zometa every 4 weeks. Perjeta held cycle 3 due to diarrhea, plan to resume cycle 4 as above. She will see APP on 10-07-15 with zometa, then cycle 4 on 10-17-15 with IVF available on 10-6. 2. Echocardiogram 08-05-15 with EF 60-65% , prior adriamycin < 300 mg/m2,  no chest radiation. 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 Husband also deaf. All communication needs to be very clear 4.positional vertigo: followed by neurology. Note she does not tolerate lying on stomach due to vertigo 5.post laparoscopic cholecystectomy 2004 6.right TRAM with original breast cancer 1999 7.premature surgical menopause, post BSO 201fr pelvic pain and ovarian cysts, likely was also beneficial from standpoint of the breast cancer.  8..claustrophobia: required full sedation for hip MRI. 9. Marked iron deficiency anemia: feraheme given 06-29-15 and 07-22-15. Chemo anemia now, not symptomatic to require transfusion 10. PAC placed by IR 08-06-15. Peripheral IV access limited to LUE. 11.Possible HA with zofran, tho we had previously thought this was not the case. Antiemetics unfortunately very limited by med intolerances, but did well cycle 3 with EMEND and zofran.  Note she has refused to use ativan SL, discussed.  12.diarrhea fromperjeta, C diff negative. Plan for perjeta as above 13.po intake better in past week, likes [employer] 14.has been given information on advance directives.  15..hypokalemia likely from diarrhea. Improved, decrease KCL to 10 mEq daily and follow 16.continuepepcid daily for GERD and nausea. 17.needs flu vaccine, which she will consider. Should give away from chemo and away from steroids.   All questions answered. Zometa, chemo, herceptin, perjeta and on pro neulasta orders in place. Message to radiation oncology. Time spent 30 min including >50% counseling and coordination of care.   LEvlyn Clines MD   09/30/2015, 7:25 PM

## 2015-10-01 ENCOUNTER — Other Ambulatory Visit: Payer: Self-pay | Admitting: Oncology

## 2015-10-01 DIAGNOSIS — C50911 Malignant neoplasm of unspecified site of right female breast: Secondary | ICD-10-CM

## 2015-10-01 DIAGNOSIS — Z79899 Other long term (current) drug therapy: Secondary | ICD-10-CM

## 2015-10-01 DIAGNOSIS — C7951 Secondary malignant neoplasm of bone: Principal | ICD-10-CM

## 2015-10-04 ENCOUNTER — Other Ambulatory Visit: Payer: Self-pay

## 2015-10-04 DIAGNOSIS — E876 Hypokalemia: Secondary | ICD-10-CM

## 2015-10-04 MED ORDER — POTASSIUM CHLORIDE ER 10 MEQ PO TBCR: 20.0000 meq | EXTENDED_RELEASE_TABLET | Freq: Every day | ORAL | 0 refills | Status: DC

## 2015-10-07 ENCOUNTER — Ambulatory Visit: Payer: Medicaid Other

## 2015-10-07 ENCOUNTER — Ambulatory Visit (HOSPITAL_BASED_OUTPATIENT_CLINIC_OR_DEPARTMENT_OTHER): Payer: Medicaid Other | Admitting: Oncology

## 2015-10-07 ENCOUNTER — Other Ambulatory Visit (HOSPITAL_BASED_OUTPATIENT_CLINIC_OR_DEPARTMENT_OTHER): Payer: Medicaid Other

## 2015-10-07 ENCOUNTER — Ambulatory Visit (HOSPITAL_BASED_OUTPATIENT_CLINIC_OR_DEPARTMENT_OTHER): Payer: Medicaid Other

## 2015-10-07 VITALS — BP 105/62 | HR 81 | Temp 98.1°F | Resp 18 | Ht 67.0 in | Wt 125.1 lb

## 2015-10-07 DIAGNOSIS — C7951 Secondary malignant neoplasm of bone: Secondary | ICD-10-CM

## 2015-10-07 DIAGNOSIS — H905 Unspecified sensorineural hearing loss: Secondary | ICD-10-CM

## 2015-10-07 DIAGNOSIS — K219 Gastro-esophageal reflux disease without esophagitis: Secondary | ICD-10-CM

## 2015-10-07 DIAGNOSIS — E876 Hypokalemia: Secondary | ICD-10-CM | POA: Diagnosis not present

## 2015-10-07 DIAGNOSIS — T451X5A Adverse effect of antineoplastic and immunosuppressive drugs, initial encounter: Secondary | ICD-10-CM

## 2015-10-07 DIAGNOSIS — D509 Iron deficiency anemia, unspecified: Secondary | ICD-10-CM

## 2015-10-07 DIAGNOSIS — C50911 Malignant neoplasm of unspecified site of right female breast: Secondary | ICD-10-CM

## 2015-10-07 DIAGNOSIS — D6481 Anemia due to antineoplastic chemotherapy: Secondary | ICD-10-CM | POA: Diagnosis not present

## 2015-10-07 DIAGNOSIS — R110 Nausea: Secondary | ICD-10-CM | POA: Diagnosis not present

## 2015-10-07 DIAGNOSIS — Z95828 Presence of other vascular implants and grafts: Secondary | ICD-10-CM

## 2015-10-07 DIAGNOSIS — R112 Nausea with vomiting, unspecified: Secondary | ICD-10-CM

## 2015-10-07 LAB — CBC WITH DIFFERENTIAL/PLATELET
BASO%: 0.1 % (ref 0.0–2.0)
BASOS ABS: 0 10*3/uL (ref 0.0–0.1)
EOS ABS: 0 10*3/uL (ref 0.0–0.5)
EOS%: 0 % (ref 0.0–7.0)
HCT: 29 % — ABNORMAL LOW (ref 34.8–46.6)
HGB: 9.5 g/dL — ABNORMAL LOW (ref 11.6–15.9)
LYMPH%: 9.5 % — ABNORMAL LOW (ref 14.0–49.7)
MCH: 30.4 pg (ref 25.1–34.0)
MCHC: 32.8 g/dL (ref 31.5–36.0)
MCV: 92.7 fL (ref 79.5–101.0)
MONO#: 0.7 10*3/uL (ref 0.1–0.9)
MONO%: 4.2 % (ref 0.0–14.0)
NEUT#: 14.9 10*3/uL — ABNORMAL HIGH (ref 1.5–6.5)
NEUT%: 86.2 % — AB (ref 38.4–76.8)
Platelets: 162 10*3/uL (ref 145–400)
RBC: 3.13 10*6/uL — AB (ref 3.70–5.45)
RDW: 16.4 % — ABNORMAL HIGH (ref 11.2–14.5)
WBC: 17.3 10*3/uL — AB (ref 3.9–10.3)
lymph#: 1.7 10*3/uL (ref 0.9–3.3)
nRBC: 0 % (ref 0–0)

## 2015-10-07 LAB — COMPREHENSIVE METABOLIC PANEL
ALT: 17 U/L (ref 0–55)
AST: 12 U/L (ref 5–34)
Albumin: 3.1 g/dL — ABNORMAL LOW (ref 3.5–5.0)
Alkaline Phosphatase: 121 U/L (ref 40–150)
Anion Gap: 8 mEq/L (ref 3–11)
BUN: 11.3 mg/dL (ref 7.0–26.0)
CHLORIDE: 107 meq/L (ref 98–109)
CO2: 25 meq/L (ref 22–29)
Calcium: 8.6 mg/dL (ref 8.4–10.4)
Creatinine: 0.8 mg/dL (ref 0.6–1.1)
EGFR: 90 mL/min/{1.73_m2} — AB (ref >90)
GLUCOSE: 100 mg/dL (ref 70–140)
POTASSIUM: 4.5 meq/L (ref 3.5–5.1)
SODIUM: 140 meq/L (ref 136–145)
Total Bilirubin: 0.51 mg/dL (ref 0.20–1.20)
Total Protein: 5.9 g/dL — ABNORMAL LOW (ref 6.4–8.3)

## 2015-10-07 MED ORDER — SODIUM CHLORIDE 0.9 % IJ SOLN
10.0000 mL | INTRAMUSCULAR | Status: DC | PRN
  Filled 2015-10-07: qty 10

## 2015-10-07 MED ORDER — ZOLEDRONIC ACID 4 MG/100ML IV SOLN
4.0000 mg | Freq: Once | INTRAVENOUS | Status: AC
  Administered 2015-10-07: 4 mg via INTRAVENOUS
  Filled 2015-10-07: qty 100

## 2015-10-07 MED ORDER — SODIUM CHLORIDE 0.9 % IV SOLN
INTRAVENOUS | Status: DC
  Administered 2015-10-07: 15:00:00 via INTRAVENOUS

## 2015-10-07 MED ORDER — HEPARIN SOD (PORK) LOCK FLUSH 100 UNIT/ML IV SOLN
500.0000 [IU] | Freq: Once | INTRAVENOUS | Status: DC | PRN
  Filled 2015-10-07: qty 5

## 2015-10-07 NOTE — Progress Notes (Signed)
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 She had orthopedic surgery for impending fracture left femur 06-26-15, radiation to LLE, first taxotere/ herceptin/ perjeta on 08-09-15 and monthly zometa.  NOTE CTs have not imaged adequately; MRI and PET much more sensitive for her disease. CT head 09-18-15 no metastatic disease.  Last zometa 09-06-15.  Because of diarrhea with first 2 cycles of perjeta, cycle 3 on 09-26-15 was taxotere and herceptin; plan is to resume perjeta at 50% dosing with cycle 4. Patient still having some difficulty following her third cycle of chemotherapy. Perjeta was held with the last cycle. She reports that she has been having a lot of nausea and intermittent vomiting, but is not really able to quantify how much she is actually vomiting. She has Zofran at home which she does not like to take because it makes her feel funny. She has lorazepam that she states she takes very rarely. She does not think she is taking the dexamethasone for 3 days following the chemotherapy, but she is not really sure. She has an allergy to Phenergan and is afraid to try Compazine. She states the only thing she is able to keep down his [employer]. States that her taste is metallic. She also reported initially having 3 loose stools today to the nursing staff, but when I inquired she denied having diarrhea. She states that she has not been taking any Imodium. She denies any pain other than mild discomfort now in left hip and thigh, specifically no pain neck, upper back or chest. She is walking more easily and more active at home.  She  denies SOB, bleeding, fever or symptoms of infection. No problems with PAC. No swelling LE. No complaints of nail changes.  Remainder of 10 point Review of Systems negative.     PAC by IR 08-06-15 Feraheme 06-29-15 and 07-22-15 Genetics testing negative by Breast Ovarian panel 03-2015 [facility] 03-15-15 heterogeneously dense tissue otherwise negative. CA 2729 on 06-26-15: 72.5    ONCOLOGIC HISTORY Patient had 1.4 cm poorly differentiated right breast carcinoma diagnosed in Nov 1999 at age 50, 1/7 axillary nodes involved, ER/PR and HER 2 positive. She had mastectomy with the 7 axillary node evaluation, 4 cycles of adriamycin/ cytoxan followed by taxotere, then five years of tamoxifen thru June 2005 (no herceptin in 1999). She did not have local radiation. She had right TRAM reconstruction. She had bilateral salpingoophorectomy in May 2005. She was briefly on aromatase inhibitor after tamoxifen, but discontinued this herself due to poor tolerance. Most recent left mammogram was done as screening on 04-15-2012 with heterogeneously dense tissue and questioned asymmetry, followed by diagnostic left mamogram on 04-28-12 which had no findings of concern. 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 She had nailing of impending left femur fracture by Dr [name] on 06-26-15. Pathology [phone] from 06-26-15 showed metastatic adenocarcinoma , strongly ER positive 95%, PR positive 2%, HER 2 positive 3+. Plain xrays suggested T1 and left 4th rib involvement, tho these also did not show on bone scan 07-01-15; like unremarkable CT, bone  scan showed only activity in left femur area of surgery. PET 07-19-15 showed extensive bone involvement axial and proximal appendicular skeleton, "severe multifocal osseous metastatic disease, much greater than anticipated based on prior imaging", involved  nodes right subpectoral and right internal mammary, and no distant extraosseous mets.  She had radiation to left femur 30 cGy completed 07-26-15. She continued IV bisphosphonate with zometa on 08-07-15 and had first taxotere herceptin perjeta on 08-09-15. Perjeta held cycle 3 after diarrhea cycles 1 and 2.    Objective:  Vital signs in last 24 hours:  BP 105/62 (BP Location: Left Arm, Patient Position: Sitting)   Pulse 81   Temp 98.1 F (36.7 C) (Oral)   Resp 18   Ht 5' 7"  (1.702 m)   Wt 125 lb 1.6 oz (56.7 kg)   SpO2 100%   BMI 19.59 kg/m  Weight down 4 lbs. Communicates easily with assistance of sign language interpreter. Alert, oriented and appropriate. Ambulatory without assistance, much more easily.  Alopecia  HEENT:PERRL, sclerae not icteric. Oral mucosa moist without lesions, posterior pharynx clear. No tearing. Neck supple. No JVD.  Lymphatics:no cervical,supraclavicular, axillary adenopathy Resp: clear to auscultation bilaterally and normal percussion bilaterally Cardio: regular rate and rhythm. No gallop. GI: soft, nontender, not distended, no mass or organomegaly. Normally active bowel sounds.  Musculoskeletal/ Extremities: UE/ LE without pitting edema, cords, tenderness Neuro: no peripheral neuropathy. Otherwise nonfocal.  Skin without rash, ecchymosis, petechiae Portacath-without erythema or tenderness  Lab Results:  Results for orders placed or performed in visit on 10/07/15  CBC with Differential  Result Value Ref Range   WBC 17.3 (H) 3.9 - 10.3 10e3/uL   NEUT# 14.9 (H) 1.5 - 6.5 10e3/uL   HGB 9.5 (L) 11.6 - 15.9 g/dL   HCT 29.0 (L) 34.8 - 46.6 %   Platelets 162 145 - 400 10e3/uL   MCV 92.7 79.5 - 101.0 fL   MCH 30.4 25.1 - 34.0 pg   MCHC 32.8 31.5 - 36.0 g/dL   RBC 3.13 (L) 3.70 - 5.45 10e6/uL   RDW 16.4 (H) 11.2 - 14.5 %   lymph# 1.7 0.9 - 3.3 10e3/uL   MONO# 0.7 0.1 - 0.9 10e3/uL   Eosinophils Absolute 0.0 0.0 - 0.5 10e3/uL   Basophils Absolute 0.0  0.0 - 0.1 10e3/uL   NEUT% 86.2 (H) 38.4 - 76.8 %   LYMPH% 9.5 (L) 14.0 - 49.7 %   MONO% 4.2 0.0 - 14.0 %   EOS% 0.0 0.0 - 7.0 %   BASO% 0.1 0.0 - 2.0 %   nRBC 0 0 - 0 %  Comprehensive metabolic panel  Result Value Ref Range   Sodium 140 136 - 145 mEq/L   Potassium 4.5 3.5 - 5.1 mEq/L   Chloride 107 98 - 109 mEq/L   CO2 25 22 - 29 mEq/L   Glucose 100 70 - 140 mg/dl   BUN 11.3 7.0 - 26.0 mg/dL   Creatinine 0.8 0.6 - 1.1 mg/dL   Total Bilirubin 0.51 0.20 - 1.20 mg/dL   Alkaline Phosphatase 121 40 - 150 U/L   AST 12 5 - 34 U/L   ALT 17 0 - 55 U/L   Total Protein 5.9 (L) 6.4 - 8.3 g/dL   Albumin 3.1 (L) 3.5 - 5.0 g/dL   Calcium 8.6 8.4 - 10.4 mg/dL   Anion Gap 8 3 - 11 mEq/L   EGFR 90 (L) >90 ml/min/1.73 m2   CA 2729 on 08-29-15   109, will repeat with  next labs  Studies/Results:  No results found.  Medications: I have reviewed the patient's current medications. She will continue her potassium 10 mEq daily. Discussed use of anti-emetics with the patient and I have encouraged her to use the Zofran every 8-12 hours and use lorazepam for breakthrough nausea. I have also reminded her to use the dexamethasone with her next cycle of chemotherapy as she is receiving Taxotere.  She will receive her Zometa as scheduled today. She has requested IV fluids because she is not eating very well although she does not look dehydrated based on her lab work her clinical exam today. She will receive a liter of normal saline here in the office along with her Zometa. Discussed the use of Imodium should she develop any diarrhea. She may use 2 tablets after the first loose stool than 1 tablet after each additional loose stool up to 8 tablets a 24-hour period.  DISCUSSION Medications as above.  Encouraged at least 3 of the [employer] drinks daily. Discussed eating clear liquids and then advanced to full liquids as she tolerates.   Assessment/Plan:  1. Metastatic ER+, HER 2 + breast cancer  extensively involving axial and appendicular skeleton: history of T1N1 right breast carcinoma age 74, ER/PR +, HER 2 + inNov 1999, no HER 2 therapy in 1999. Post nailing of impending fracture left femur by Dr [name] 06-26-15 and localradiation. FirstHerceptin, Perjeta and taxotere 08-09-15, with on pro neulasta; zometa every 4 weeks. Perjeta held cycle 3 due to diarrhea, plan to resume cycle 4 at 50% of the dose. He will keep follow-up on October 2 with Dr. Marko Plume and then cycle 4 on 10-17-15 with IVF available on 10-6. 2. Echocardiogram 08-05-15 with EF 60-65% , prior adriamycin < 300 mg/m2, no chest radiation. 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 Husband also deaf. All communication needs to be very clear 4.positional vertigo: followed by neurology. Note she does not tolerate lying on stomach due to vertigo 5.post laparoscopic cholecystectomy 2004 6.right TRAM with original breast cancer 1999 7.premature surgical menopause, post BSO 2029fr pelvic pain and ovarian cysts, likely was also beneficial from standpoint of the breast cancer.  8..claustrophobia: required full sedation for hip MRI. 9. Marked iron deficiency anemia: feraheme given 06-29-15 and 07-22-15. Chemo anemia now, not symptomatic to require transfusion 10. PAC placed by IR 08-06-15. Peripheral IV access limited to LUE. 11.Possible HA with zofran, tho we had previously thought this was not the case. Antiemetics unfortunately very limited by med intolerances, but did well cycle 3 with EMEND and zofran.  Note she has refused to use ativan SL, discussed.  12.diarrhea fromperjeta, C diff negative. Plan for perjeta as above 13.po intake better in past week, likes [employer] 14.has been given information on advance directives.  15..hypokalemia likely from diarrhea. Improved, decrease KCL to 10 mEq daily and follow 16.continuepepcid daily for GERD  and nausea. 17.needs flu vaccine, which she will consider. Should give away from chemo and away from steroids.   All questions answered. Zometa, chemo, herceptin, perjeta and on pro neulasta orders in place. Time spent 30 min including >50% counseling and coordination of care.   CMikey Bussing NP   10/07/2015, 4:17 PM

## 2015-10-07 NOTE — Patient Instructions (Signed)
Zoledronic Acid injection (Hypercalcemia, Oncology)  What is this medicine?  ZOLEDRONIC ACID (ZOE le dron ik AS id) lowers the amount of calcium loss from bone. It is used to treat too much calcium in your blood from cancer. It is also used to prevent complications of cancer that has spread to the bone.  This medicine may be used for other purposes; ask your health care provider or pharmacist if you have questions.  What should I tell my health care provider before I take this medicine?  They need to know if you have any of these conditions:  -aspirin-sensitive asthma  -cancer, especially if you are receiving medicines used to treat cancer  -dental disease or wear dentures  -infection  -kidney disease  -receiving corticosteroids like dexamethasone or prednisone  -an unusual or allergic reaction to zoledronic acid, other medicines, foods, dyes, or preservatives  -pregnant or trying to get pregnant  -breast-feeding  How should I use this medicine?  This medicine is for infusion into a vein. It is given by a health care professional in a hospital or clinic setting.  Talk to your pediatrician regarding the use of this medicine in children. Special care may be needed.  Overdosage: If you think you have taken too much of this medicine contact a poison control center or emergency room at once.  NOTE: This medicine is only for you. Do not share this medicine with others.  What if I miss a dose?  It is important not to miss your dose. Call your doctor or health care professional if you are unable to keep an appointment.  What may interact with this medicine?  -certain antibiotics given by injection  -NSAIDs, medicines for pain and inflammation, like ibuprofen or naproxen  -some diuretics like bumetanide, furosemide  -teriparatide  -thalidomide  This list may not describe all possible interactions. Give your health care provider a list of all the medicines, herbs, non-prescription drugs, or dietary supplements you use. Also  tell them if you smoke, drink alcohol, or use illegal drugs. Some items may interact with your medicine.  What should I watch for while using this medicine?  Visit your doctor or health care professional for regular checkups. It may be some time before you see the benefit from this medicine. Do not stop taking your medicine unless your doctor tells you to. Your doctor may order blood tests or other tests to see how you are doing.  Women should inform their doctor if they wish to become pregnant or think they might be pregnant. There is a potential for serious side effects to an unborn child. Talk to your health care professional or pharmacist for more information.  You should make sure that you get enough calcium and vitamin D while you are taking this medicine. Discuss the foods you eat and the vitamins you take with your health care professional.  Some people who take this medicine have severe bone, joint, and/or muscle pain. This medicine may also increase your risk for jaw problems or a broken thigh bone. Tell your doctor right away if you have severe pain in your jaw, bones, joints, or muscles. Tell your doctor if you have any pain that does not go away or that gets worse.  Tell your dentist and dental surgeon that you are taking this medicine. You should not have major dental surgery while on this medicine. See your dentist to have a dental exam and fix any dental problems before starting this medicine. Take good care   your doctor or health care professional as soon as possible: -allergic reactions like skin rash, itching or hives, swelling of the face, lips, or tongue -anxiety, confusion, or depression -breathing problems -changes in vision -eye pain -feeling faint or lightheaded, falls -jaw pain,  especially after dental work -mouth sores -muscle cramps, stiffness, or weakness -redness, blistering, peeling or loosening of the skin, including inside the mouth -trouble passing urine or change in the amount of urine Side effects that usually do not require medical attention (report to your doctor or health care professional if they continue or are bothersome): -bone, joint, or muscle pain -constipation -diarrhea -fever -hair loss -irritation at site where injected -loss of appetite -nausea, vomiting -stomach upset -trouble sleeping -trouble swallowing -weak or tired This list may not describe all possible side effects. Call your doctor for medical advice about side effects. You may report side effects to FDA at 1-800-FDA-1088. Where should I keep my medicine? This drug is given in a hospital or clinic and will not be stored at home. NOTE: This sheet is a summary. It may not cover all possible information. If you have questions about this medicine, talk to your doctor, pharmacist, or health care provider.    2016, Elsevier/Gold Standard. (2013-05-27 14:19:39)   Dehydration, Adult Dehydration is a condition in which you do not have enough fluid or water in your body. It happens when you take in less fluid than you lose. Vital organs such as the kidneys, brain, and heart cannot function without a proper amount of fluids. Any loss of fluids from the body can cause dehydration.  Dehydration can range from mild to severe. This condition should be treated right away to help prevent it from becoming severe. CAUSES  This condition may be caused by:  Vomiting.  Diarrhea.  Excessive sweating, such as when exercising in hot or humid weather.  Not drinking enough fluid during strenuous exercise or during an illness.  Excessive urine output.  Fever.  Certain medicines. RISK FACTORS This condition is more likely to develop in:  People who are taking certain medicines that cause  the body to lose excess fluid (diuretics).   People who have a chronic illness, such as diabetes, that may increase urination.  Older adults.   People who live at high altitudes.   People who participate in endurance sports.  SYMPTOMS  Mild Dehydration  Thirst.  Dry lips.  Slightly dry mouth.  Dry, warm skin. Moderate Dehydration  Very dry mouth.   Muscle cramps.   Dark urine and decreased urine production.   Decreased tear production.   Headache.   Light-headedness, especially when you stand up from a sitting position.  Severe Dehydration  Changes in skin.   Cold and clammy skin.   Skin does not spring back quickly when lightly pinched and released.   Changes in body fluids.   Extreme thirst.   No tears.   Not able to sweat when body temperature is high, such as in hot weather.   Minimal urine production.   Changes in vital signs.   Rapid, weak pulse (more than 100 beats per minute when you are sitting still).   Rapid breathing.   Low blood pressure.   Other changes.   Sunken eyes.   Cold hands and feet.   Confusion.  Lethargy and difficulty being awakened.  Fainting (syncope).   Short-term weight loss.   Unconsciousness. DIAGNOSIS  This condition may be diagnosed based on your symptoms. You may also have tests  to determine how severe your dehydration is. These tests may include:   Urine tests.   Blood tests.  TREATMENT  Treatment for this condition depends on the severity. Mild or moderate dehydration can often be treated at home. Treatment should be started right away. Do not wait until dehydration becomes severe. Severe dehydration needs to be treated at the hospital. Treatment for Mild Dehydration  Drinking plenty of water to replace the fluid you have lost.   Replacing minerals in your blood (electrolytes) that you may have lost.  Treatment for Moderate Dehydration  Consuming oral rehydration  solution (ORS). Treatment for Severe Dehydration  Receiving fluid through an IV tube.   Receiving electrolyte solution through a feeding tube that is passed through your nose and into your stomach (nasogastric tube or NG tube).  Correcting any abnormalities in electrolytes. HOME CARE INSTRUCTIONS   Drink enough fluid to keep your urine clear or pale yellow.   Drink water or fluid slowly by taking small sips. You can also try sucking on ice cubes.  Have food or beverages that contain electrolytes. Examples include bananas and sports drinks.  Take over-the-counter and prescription medicines only as told by your health care provider.   Prepare ORS according to the manufacturer's instructions. Take sips of ORS every 5 minutes until your urine returns to normal.  If you have vomiting or diarrhea, continue to try to drink water, ORS, or both.   If you have diarrhea, avoid:   Beverages that contain caffeine.   Fruit juice.   Milk.   Carbonated soft drinks.  Do not take salt tablets. This can lead to the condition of having too much sodium in your body (hypernatremia).  SEEK MEDICAL CARE IF:  You cannot eat or drink without vomiting.  You have had moderate diarrhea during a period of more than 24 hours.  You have a fever. SEEK IMMEDIATE MEDICAL CARE IF:   You have extreme thirst.  You have severe diarrhea.  You have not urinated in 6-8 hours, or you have urinated only a small amount of very dark urine.  You have shriveled skin.  You are dizzy, confused, or both.   This information is not intended to replace advice given to you by your health care provider. Make sure you discuss any questions you have with your health care provider.   Document Released: 12/29/2004 Document Revised: 09/19/2014 Document Reviewed: 05/16/2014 Elsevier Interactive Patient Education [employer].

## 2015-10-08 LAB — CANCER ANTIGEN 27.29: CAN 27.29: 93 U/mL — AB (ref 0.0–38.6)

## 2015-10-09 ENCOUNTER — Telehealth: Payer: Self-pay | Admitting: *Deleted

## 2015-10-09 ENCOUNTER — Encounter: Payer: Self-pay | Admitting: Oncology

## 2015-10-09 ENCOUNTER — Other Ambulatory Visit: Payer: Self-pay | Admitting: Oncology

## 2015-10-09 NOTE — Progress Notes (Signed)
Lenise advised me that Ray from [employer] called in reference to repairing patient's vehicle and confirming grant funds are available. I called Ray to get an estimate on the cost and confirmed funds are available. I provided him with my contact name,number, and fax.  Returned call from voicemail left by patient through interpreter service. Patient wanted to know if grant had been set up and if I had spoken with Ray whom was repairing her vehicle. I communicated to patient that I had just spoken with Ray to confirm and he will provide me with what I need. Patient states ok she just wanted to be sure. Asked if there was anything else I could assist her with and she said no that was all. Thanked her and advised her to have a nice day.

## 2015-10-09 NOTE — Telephone Encounter (Signed)
Per patient request I have moved all appts fromThursday to Friday. Patient to pick up schedule on Monday

## 2015-10-13 ENCOUNTER — Other Ambulatory Visit: Payer: Self-pay | Admitting: Oncology

## 2015-10-14 ENCOUNTER — Ambulatory Visit: Payer: Medicaid Other

## 2015-10-14 ENCOUNTER — Other Ambulatory Visit (HOSPITAL_BASED_OUTPATIENT_CLINIC_OR_DEPARTMENT_OTHER): Payer: Medicaid Other

## 2015-10-14 ENCOUNTER — Telehealth: Payer: Self-pay

## 2015-10-14 ENCOUNTER — Ambulatory Visit (HOSPITAL_BASED_OUTPATIENT_CLINIC_OR_DEPARTMENT_OTHER): Payer: Medicaid Other

## 2015-10-14 ENCOUNTER — Ambulatory Visit (HOSPITAL_BASED_OUTPATIENT_CLINIC_OR_DEPARTMENT_OTHER): Payer: Medicaid Other | Admitting: Oncology

## 2015-10-14 ENCOUNTER — Other Ambulatory Visit: Payer: Self-pay | Admitting: Oncology

## 2015-10-14 VITALS — BP 117/51 | HR 77 | Temp 97.8°F | Resp 18 | Ht 67.0 in | Wt 131.4 lb

## 2015-10-14 DIAGNOSIS — Z23 Encounter for immunization: Secondary | ICD-10-CM

## 2015-10-14 DIAGNOSIS — C7951 Secondary malignant neoplasm of bone: Secondary | ICD-10-CM

## 2015-10-14 DIAGNOSIS — R112 Nausea with vomiting, unspecified: Secondary | ICD-10-CM

## 2015-10-14 DIAGNOSIS — E876 Hypokalemia: Secondary | ICD-10-CM | POA: Diagnosis not present

## 2015-10-14 DIAGNOSIS — C50911 Malignant neoplasm of unspecified site of right female breast: Secondary | ICD-10-CM

## 2015-10-14 DIAGNOSIS — Z5111 Encounter for antineoplastic chemotherapy: Secondary | ICD-10-CM

## 2015-10-14 DIAGNOSIS — T451X5A Adverse effect of antineoplastic and immunosuppressive drugs, initial encounter: Secondary | ICD-10-CM

## 2015-10-14 DIAGNOSIS — Z95828 Presence of other vascular implants and grafts: Secondary | ICD-10-CM

## 2015-10-14 DIAGNOSIS — H905 Unspecified sensorineural hearing loss: Secondary | ICD-10-CM

## 2015-10-14 DIAGNOSIS — D6481 Anemia due to antineoplastic chemotherapy: Secondary | ICD-10-CM

## 2015-10-14 LAB — COMPREHENSIVE METABOLIC PANEL
ALBUMIN: 2.9 g/dL — AB (ref 3.5–5.0)
ALK PHOS: 71 U/L (ref 40–150)
ALT: 17 U/L (ref 0–55)
ANION GAP: 8 meq/L (ref 3–11)
AST: 11 U/L (ref 5–34)
BILIRUBIN TOTAL: 0.62 mg/dL (ref 0.20–1.20)
BUN: 6.8 mg/dL — ABNORMAL LOW (ref 7.0–26.0)
CALCIUM: 8.6 mg/dL (ref 8.4–10.4)
CO2: 25 meq/L (ref 22–29)
CREATININE: 0.7 mg/dL (ref 0.6–1.1)
Chloride: 109 mEq/L (ref 98–109)
Glucose: 105 mg/dl (ref 70–140)
Potassium: 3.7 mEq/L (ref 3.5–5.1)
Sodium: 143 mEq/L (ref 136–145)
TOTAL PROTEIN: 5.5 g/dL — AB (ref 6.4–8.3)

## 2015-10-14 LAB — CBC WITH DIFFERENTIAL/PLATELET
BASO%: 0.2 % (ref 0.0–2.0)
BASOS ABS: 0 10*3/uL (ref 0.0–0.1)
EOS ABS: 0 10*3/uL (ref 0.0–0.5)
EOS%: 0.2 % (ref 0.0–7.0)
HEMATOCRIT: 27 % — AB (ref 34.8–46.6)
HGB: 8.8 g/dL — ABNORMAL LOW (ref 11.6–15.9)
LYMPH%: 18.2 % (ref 14.0–49.7)
MCH: 30.6 pg (ref 25.1–34.0)
MCHC: 32.6 g/dL (ref 31.5–36.0)
MCV: 93.8 fL (ref 79.5–101.0)
MONO#: 0.3 10*3/uL (ref 0.1–0.9)
MONO%: 7 % (ref 0.0–14.0)
NEUT#: 3.2 10*3/uL (ref 1.5–6.5)
NEUT%: 74.4 % (ref 38.4–76.8)
PLATELETS: 140 10*3/uL — AB (ref 145–400)
RBC: 2.88 10*6/uL — ABNORMAL LOW (ref 3.70–5.45)
RDW: 16.4 % — ABNORMAL HIGH (ref 11.2–14.5)
WBC: 4.3 10*3/uL (ref 3.9–10.3)
lymph#: 0.8 10*3/uL — ABNORMAL LOW (ref 0.9–3.3)

## 2015-10-14 MED ORDER — SODIUM CHLORIDE 0.9 % IJ SOLN
10.0000 mL | INTRAMUSCULAR | Status: DC | PRN
  Administered 2015-10-14: 10 mL via INTRAVENOUS
  Filled 2015-10-14: qty 10

## 2015-10-14 MED ORDER — SODIUM CHLORIDE 0.9 % IV SOLN: Freq: Once | INTRAVENOUS | Status: DC

## 2015-10-14 MED ORDER — INFLUENZA VAC SPLIT QUAD 0.5 ML IM SUSY
0.5000 mL | PREFILLED_SYRINGE | Freq: Once | INTRAMUSCULAR | Status: AC
  Administered 2015-10-14: 0.5 mL via INTRAMUSCULAR
  Filled 2015-10-14: qty 0.5

## 2015-10-14 MED ORDER — HEPARIN SOD (PORK) LOCK FLUSH 100 UNIT/ML IV SOLN
500.0000 [IU] | Freq: Once | INTRAVENOUS | Status: AC | PRN
  Administered 2015-10-14: 500 [IU] via INTRAVENOUS
  Filled 2015-10-14: qty 5

## 2015-10-14 MED ORDER — ZOLEDRONIC ACID 4 MG/5ML IV CONC: 4.0000 mg | Freq: Once | INTRAVENOUS | Status: DC

## 2015-10-14 NOTE — Telephone Encounter (Signed)
S/w pt through interpreter. We are adding zometa today. appt about 15 minutes so she should be able to tolerate a chair. Pt agreeable.

## 2015-10-14 NOTE — Progress Notes (Signed)
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 She is being treated with taxotere, herceptin, perjeta and zometa; perjeta was held with cycle 3 chemo on 09-25-12 due to diarrhea with previous 2 cycles, plan to resume perjeta at 50% dosing with cycle 4.  Last zometa 10-07-15. Plan is to consider AI with continued herceptin and perjeta after ~ 6 cycles of taxotere. Initial breast cancer was Nov 1999 at age 75, found metastatic 06-2015. She is post orthopedic surgery for impending fracture left femur and local radiation there.   Patient is feeling much better overall in last few weeks. She has minimal discomfort intermittently in left upper thigh, denies neck, back or other extremity pain.  Appetite and po intake are much better, with no significant N/V, no GERD off of pepcid now,  and bowels moving daily with formed bowel movements. No LE swelling. No fever or symptoms of infection. Energy improved. No problems with PAC. No bleeding. No significant peripheral neuropathy Remainder of 10 point Review of Systems negative.    PAC by IR 08-06-15 Feraheme 06-29-15 and 07-22-15 Genetics testing negative by Breast Ovarian panel 03-2015 [facility] 03-15-15 heterogeneously dense tissue otherwise negative. CA 2729 on 06-26-15: 72.5 Flu vaccine 10-14-15  ONCOLOGIC HISTORY  Patient had 1.4 cm poorly differentiated right breast carcinoma diagnosed in Nov 1999 at age 50, 1/7 axillary nodes involved, ER/PR and HER 2 positive. She had mastectomy with the 7 axillary node evaluation, 4 cycles of adriamycin/ cytoxan followed by taxotere, then five years of tamoxifen thru June 2005 (no herceptin in  1999). She did not have local radiation. She had right TRAM reconstruction. She had bilateral salpingoophorectomy in May 2005. She was briefly on aromatase inhibitor after tamoxifen, but discontinued this herself due to poor tolerance. Most recent left mammogram was done as screening on 04-15-2012 with heterogeneously dense tissue and questioned asymmetry, followed by diagnostic left mamogram on 04-28-12 which had no findings of concern. 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 She had nailing of impending left femur fracture by Dr [name] on 06-26-15. Pathology [phone] from 06-26-15 showed metastatic adenocarcinoma , strongly ER positive 95%, PR positive 2%, HER 2 positive 3+. Plain xrays suggested T1 and left 4th rib involvement, tho these also did not show on bone scan 07-01-15; like unremarkable CT, bone scan showed only activity in left femur area of surgery. PET 07-19-15 showed extensive bone involvement axial and proximal appendicular skeleton, "severe multifocal osseous metastatic disease, much greater than anticipated based on prior imaging", involved nodes right subpectoral and right internal mammary, and no distant extraosseous mets.  She had radiation to left femur 30 cGy completed 07-26-15. She continued IV bisphosphonate with zometa on 08-07-15 and had first taxotere herceptin perjeta on 08-09-15. Perjeta held cycle 3 after diarrhea cycles 1 and 2.   Objective:  Vital signs in last 24 hours:  BP (!) 117/51 (BP Location: Left Arm, Patient Position: Sitting)   Pulse 77   Temp 97.8 F (36.6 C) (Oral)   Resp 18   Ht 5' 7"  (1.702 m)   Wt 131 lb 6.4 oz (59.6 kg)   SpO2  100%   BMI 20.58 kg/m  Weight up 2 lbs. Alert, oriented and appropriate. Ambulatory without difficulty, easily able to get on and off exam table and to change positions   HEENT:PERRL, sclerae not icteric. Oral  mucosa moist without lesions, posterior pharynx clear. No excessive lacrimation Neck supple. No JVD.  Lymphatics:no cervical,supraclavicular adenopathy Resp: clear to auscultation bilaterally and normal percussion bilaterally Cardio: regular rate and rhythm. No gallop. GI: soft, nontender, not distended, no mass or organomegaly. Normally active bowel sounds.  Musculoskeletal/ Extremities: Back not tender, UE/ LE without pitting edema, cords, tenderness. Nails ok Neuro: no peripheral neuropathy. Congenital deafness. Otherwise nonfocal Skin without rash, ecchymosis, petechiae Portacath-without erythema or tenderness  Lab Results:  Results for orders placed or performed in visit on 10/14/15  CBC with Differential  Result Value Ref Range   WBC 4.3 3.9 - 10.3 10e3/uL   NEUT# 3.2 1.5 - 6.5 10e3/uL   HGB 8.8 (L) 11.6 - 15.9 g/dL   HCT 27.0 (L) 34.8 - 46.6 %   Platelets 140 (L) 145 - 400 10e3/uL   MCV 93.8 79.5 - 101.0 fL   MCH 30.6 25.1 - 34.0 pg   MCHC 32.6 31.5 - 36.0 g/dL   RBC 2.88 (L) 3.70 - 5.45 10e6/uL   RDW 16.4 (H) 11.2 - 14.5 %   lymph# 0.8 (L) 0.9 - 3.3 10e3/uL   MONO# 0.3 0.1 - 0.9 10e3/uL   Eosinophils Absolute 0.0 0.0 - 0.5 10e3/uL   Basophils Absolute 0.0 0.0 - 0.1 10e3/uL   NEUT% 74.4 38.4 - 76.8 %   LYMPH% 18.2 14.0 - 49.7 %   MONO% 7.0 0.0 - 14.0 %   EOS% 0.2 0.0 - 7.0 %   BASO% 0.2 0.0 - 2.0 %  Comprehensive metabolic panel  Result Value Ref Range   Sodium 143 136 - 145 mEq/L   Potassium 3.7 3.5 - 5.1 mEq/L   Chloride 109 98 - 109 mEq/L   CO2 25 22 - 29 mEq/L   Glucose 105 70 - 140 mg/dl   BUN 6.8 (L) 7.0 - 26.0 mg/dL   Creatinine 0.7 0.6 - 1.1 mg/dL   Total Bilirubin 0.62 0.20 - 1.20 mg/dL   Alkaline Phosphatase 71 40 - 150 U/L   AST 11 5 - 34 U/L   ALT 17 0 - 55 U/L   Total Protein 5.5 (L) 6.4 - 8.3 g/dL   Albumin 2.9 (L) 3.5 - 5.0 g/dL   Calcium 8.6 8.4 - 10.4 mg/dL   Anion Gap 8 3 - 11 mEq/L   EGFR >90 >90 ml/min/1.73 m2    CA 2729 on 10-07-15 93,  this having been 110 on 08-29-15  Studies/Results:  No results found.  Medications: I have reviewed the patient's current medications. Refill decadron 8 mg bid x 3 days begin day prior to taxotere. She agrees to flu vaccine today She has stopped pepcid  DISCUSSION Discussed resuming Perjeta at lower dose with cycle 4. Note patient had not wanted separate day of IVF after upcoming treatment, but does have additional IVF day of chemo. Carefully instructed her to push po fluids and call if diarrhea after this treatment. Reviewed need to call if for some reason the on pro neulasta does not function correctly.   She now agrees to flu vaccine  Assessment/Plan: 1.Metastatic ER+, HER 2 + breast cancer extensively involving axial and appendicular skeleton: history of T1N1 right breast carcinoma age 5, ER/PR +, HER 2 + inNov 1999, no HER 2 therapy  in 1999. Post nailing of impending fracture left femur by Dr [name] 06-26-15 and localradiation. FirstHerceptin, Perjeta and taxotere 08-09-15, with on pro neulasta; zometa every 4 weeks. Perjeta held cycle 3 due to diarrhea, will resume with cycle 4 50% dosing. I will see her with labs 10-28-15.  Consider change to AI + herceptin perjeta after 6 cycles taxotere.  2. Echocardiogram 08-05-15 with EF 60-65% , prior adriamycin < 300 mg/m2, no chest radiation. Will need to repeat echo q 3 months on HER2 therapy, scheduled for 10-31-15 3.congenital deafness: needs sign language interpreter for all visits. Husband also deaf. All communication needs to be very clear 4.positional vertigo: followed by neurology. Note she does not tolerate lying on stomach due to vertigo 5.post laparoscopic cholecystectomy 2004 6.right TRAM with original breast cancer 1999 7.premature surgical menopause, post BSO 2060fr pelvic pain and ovarian cysts, likely was also beneficial from standpoint of the breast cancer.  8..claustrophobia: required full sedation for hip MRI. 9. Marked  iron deficiency anemia: feraheme given 06-29-15 and 07-22-15. Chemo anemia now, not symptomatic to require transfusion 10. PAC placed by IR 08-06-15. Peripheral IV access limited to LUE. 11.Possible HA with zofran, tho we had previously thought this was not the case. Antiemetics unfortunately very limited by med intolerances, but did well cycle 3 with EMEND and zofran.  Note she has refusedto use ativan SL, discussed.  12.diarrhea fromperjeta, C diff negative. Plan for perjeta as above 13.has been given information on advance directives.  14..hypokalemia from  Perjeta diarrhea. Follow if more diarrhea 15. flu vaccine given 10-14-15   All questions answered and patient seems to follow and understand all of discussion now, with assistance of sign language interpreter. Chemo/ herceptin/ perjeta/ neulasta/ IVF orders placed. Time spent 25 min including >50% counseling and coordination of care.     LEvlyn Clines MD   10/14/2015, 8:27 PM

## 2015-10-14 NOTE — Progress Notes (Signed)
Patient not to receive Zometa today as she had infusion on 10/07/2015 per [place]. Patient to receive Influenza Vaccine only per Dr. Marko Plume.

## 2015-10-14 NOTE — Telephone Encounter (Signed)
-----   Message from Thomos Lemons, NT sent at 10/14/2015 11:54 AM EDT ----- Regarding: RE: ? zometa 10-2 We should be able to just call when she arrrives  ----- Message ----- From: Gordy Levan, MD Sent: 10/13/2015   7:24 PM To: Adalberto Cole, RN, Janace Hoard, RN, # Subject: ? zometa 10-2                                  Is due zometa but did not get it scheduled. To see me 10-2 lab 2:30 and MD 3:00  If space or any cancellations, I would like her to have zometa also   Juliann Pulse- if able to give zometa 10-2, please try to let her know prior to visit.  She is deaf. Zometa is short, so she should be ok in chair.  Thank you! Lennis

## 2015-10-15 ENCOUNTER — Other Ambulatory Visit: Payer: Self-pay | Admitting: Oncology

## 2015-10-15 ENCOUNTER — Encounter: Payer: Self-pay | Admitting: Oncology

## 2015-10-16 ENCOUNTER — Telehealth: Payer: Self-pay

## 2015-10-16 DIAGNOSIS — C7951 Secondary malignant neoplasm of bone: Principal | ICD-10-CM

## 2015-10-16 DIAGNOSIS — C50919 Malignant neoplasm of unspecified site of unspecified female breast: Secondary | ICD-10-CM

## 2015-10-16 MED ORDER — DEXAMETHASONE 4 MG PO TABS: ORAL_TABLET | ORAL | 1 refills | Status: DC

## 2015-10-16 NOTE — Telephone Encounter (Signed)
Reviewed decadron dosing with Ms Henault as noted below by Dr. Marko Plume.   Pt had 1 refill left on previous decadron prescription.  [facility]  Will send in a new prescription now to have on hold for future treatments.

## 2015-10-16 NOTE — Telephone Encounter (Signed)
-----   Message from Gordy Levan, MD sent at 10/15/2015  5:55 PM EDT ----- Last message re decadron dose not correct. I cannot edit that message. Correct as follows:  Decadron for taxotere is 8 mg bid x 3 days begin day prior to chemo, so start on 10-17-15 before chemo on 10-6. Each dose with food. Please refill if needed and go over instructions with patient.  Probably still best to give #12 for one cycle with refills.  Thank you

## 2015-10-17 ENCOUNTER — Ambulatory Visit: Payer: Medicaid Other

## 2015-10-18 ENCOUNTER — Ambulatory Visit: Payer: Medicaid Other

## 2015-10-18 ENCOUNTER — Ambulatory Visit: Payer: Medicaid Other | Admitting: Oncology

## 2015-10-18 ENCOUNTER — Telehealth: Payer: Self-pay

## 2015-10-18 ENCOUNTER — Ambulatory Visit (HOSPITAL_BASED_OUTPATIENT_CLINIC_OR_DEPARTMENT_OTHER): Payer: Medicaid Other

## 2015-10-18 ENCOUNTER — Other Ambulatory Visit: Payer: Self-pay | Admitting: Oncology

## 2015-10-18 VITALS — BP 131/76 | HR 76 | Temp 98.2°F | Resp 18

## 2015-10-18 DIAGNOSIS — C7951 Secondary malignant neoplasm of bone: Secondary | ICD-10-CM

## 2015-10-18 DIAGNOSIS — Z5111 Encounter for antineoplastic chemotherapy: Secondary | ICD-10-CM | POA: Diagnosis present

## 2015-10-18 DIAGNOSIS — Z5112 Encounter for antineoplastic immunotherapy: Secondary | ICD-10-CM | POA: Diagnosis not present

## 2015-10-18 DIAGNOSIS — C50911 Malignant neoplasm of unspecified site of right female breast: Secondary | ICD-10-CM | POA: Diagnosis not present

## 2015-10-18 MED ORDER — LORAZEPAM 2 MG/ML IJ SOLN
INTRAMUSCULAR | Status: AC
  Filled 2015-10-18: qty 1

## 2015-10-18 MED ORDER — LORATADINE 10 MG PO TABS
10.0000 mg | ORAL_TABLET | Freq: Every day | ORAL | Status: DC
  Administered 2015-10-18: 10 mg via ORAL
  Filled 2015-10-18: qty 1

## 2015-10-18 MED ORDER — ACETAMINOPHEN 325 MG PO TABS
ORAL_TABLET | ORAL | Status: AC
  Filled 2015-10-18: qty 2

## 2015-10-18 MED ORDER — PEGFILGRASTIM 6 MG/0.6ML ~~LOC~~ PSKT
6.0000 mg | PREFILLED_SYRINGE | Freq: Once | SUBCUTANEOUS | Status: AC
  Administered 2015-10-18: 6 mg via SUBCUTANEOUS
  Filled 2015-10-18: qty 0.6

## 2015-10-18 MED ORDER — SODIUM CHLORIDE 0.9% FLUSH
10.0000 mL | INTRAVENOUS | Status: DC | PRN
  Administered 2015-10-18: 10 mL
  Filled 2015-10-18: qty 10

## 2015-10-18 MED ORDER — SODIUM CHLORIDE 0.9 % IV SOLN
Freq: Once | INTRAVENOUS | Status: AC
  Administered 2015-10-18: 14:00:00 via INTRAVENOUS
  Filled 2015-10-18: qty 5

## 2015-10-18 MED ORDER — PERTUZUMAB CHEMO INJECTION 420 MG/14ML
210.0000 mg | Freq: Once | INTRAVENOUS | Status: AC
  Administered 2015-10-18: 210 mg via INTRAVENOUS
  Filled 2015-10-18: qty 7

## 2015-10-18 MED ORDER — DOCETAXEL CHEMO INJECTION 160 MG/16ML: 75.0000 mg/m2 | Freq: Once | INTRAVENOUS | Status: DC

## 2015-10-18 MED ORDER — ACETAMINOPHEN 325 MG PO TABS
650.0000 mg | ORAL_TABLET | Freq: Once | ORAL | Status: AC
  Administered 2015-10-18: 650 mg via ORAL

## 2015-10-18 MED ORDER — LORAZEPAM 2 MG/ML IJ SOLN
1.0000 mg | Freq: Once | INTRAMUSCULAR | Status: AC | PRN
  Administered 2015-10-18: 1 mg via INTRAVENOUS

## 2015-10-18 MED ORDER — SODIUM CHLORIDE 0.9 % IV SOLN
6.0000 mg/kg | Freq: Once | INTRAVENOUS | Status: AC
  Administered 2015-10-18: 357 mg via INTRAVENOUS
  Filled 2015-10-18: qty 17

## 2015-10-18 MED ORDER — ONDANSETRON HCL 40 MG/20ML IJ SOLN
Freq: Once | INTRAMUSCULAR | Status: AC
  Administered 2015-10-18: 14:00:00 via INTRAVENOUS
  Filled 2015-10-18: qty 4

## 2015-10-18 MED ORDER — SODIUM CHLORIDE 0.9 % IV SOLN
Freq: Once | INTRAVENOUS | Status: AC
  Administered 2015-10-18: 14:00:00 via INTRAVENOUS

## 2015-10-18 MED ORDER — HEPARIN SOD (PORK) LOCK FLUSH 100 UNIT/ML IV SOLN
500.0000 [IU] | Freq: Once | INTRAVENOUS | Status: AC | PRN
  Administered 2015-10-18: 500 [IU]
  Filled 2015-10-18: qty 5

## 2015-10-18 MED ORDER — SODIUM CHLORIDE 0.9 % IV SOLN
75.0000 mg/m2 | Freq: Once | INTRAVENOUS | Status: AC
  Administered 2015-10-18: 130 mg via INTRAVENOUS
  Filled 2015-10-18: qty 13

## 2015-10-18 NOTE — Progress Notes (Signed)
1615: Pt instructed (via sign language interpreter) to apply ice to fingers during Taxotere infusion, per Dr. Marko Plume. Supplied ice filled baggies, pt observed using ice as instructed.

## 2015-10-18 NOTE — Telephone Encounter (Signed)
Ms Duree did arrive at the cancer center and Dr. Marko Plume saw patient today as well.

## 2015-10-18 NOTE — Progress Notes (Signed)
Pt tolerated treatment today. IVF's 1L given. VSS stable upon discharge. Pt reports feeling "weak and wobbly" when she got up to go to the bathroom. Pt was sleeping in the same position for a few hrs before getting out of bed. Pt states that this has never happened to her before and was a little concerned. Ned Card, NP came to evaluate pt and suggested that she stayed a little longer after chemo treatment to see if symptoms resolve. Pt refused and will call if she has any more concerns.

## 2015-10-18 NOTE — Patient Instructions (Signed)
[facility] Discharge Instructions for Patients Receiving Chemotherapy  Today you received the following chemotherapy agents Herceptin, Perjeta, Taxotere  To help prevent nausea and vomiting after your treatment, we encourage you to take your nausea  As directed If you develop nausea and vomiting that is not controlled by your nausea medication, call the clinic.   BELOW ARE SYMPTOMS THAT SHOULD BE REPORTED IMMEDIATELY:  *FEVER GREATER THAN 100.5 F  *CHILLS WITH OR WITHOUT FEVER  NAUSEA AND VOMITING THAT IS NOT CONTROLLED WITH YOUR NAUSEA MEDICATION  *UNUSUAL SHORTNESS OF BREATH  *UNUSUAL BRUISING OR BLEEDING  TENDERNESS IN MOUTH AND THROAT WITH OR WITHOUT PRESENCE OF ULCERS  *URINARY PROBLEMS  *BOWEL PROBLEMS  UNUSUAL RASH Items with * indicate a potential emergency and should be followed up as soon as possible.  Feel free to call the clinic you have any questions or concerns. The clinic phone number is [phone].  Please show the [place] at check-in to the Emergency Department and triage nurse.

## 2015-10-18 NOTE — Telephone Encounter (Signed)
[name] just called stating that she had 1 pm today for treatment. Told [name] that she will have to have a recliner chair. Pt very upset as this is the second time this has happened to her schedule. Discussed issue with Addison Lank for the cancer center as she may need to inter vine while patient is here for her treatment. [place] scheduler called  the interpreter service.  An interpreter will be at the cancer cneter for patient's appointment at 1 pm.

## 2015-10-18 NOTE — Telephone Encounter (Signed)
Called and left message for [name]  To see if she is ok as she has not come to her chemotherapy appointment this morning at 0900. LM in husband's cell phone as well. Interpreter was gracious enough to wait until 1030 this morning incase pt came late for treatment.

## 2015-10-21 NOTE — Progress Notes (Signed)
10/18/2015 Patient was explained need for placing hands in ice by Stanford Scotland and she was very confused and questioned the reasoning and wanted to talk to Dr Marko Plume.    Dr. Marko Plume came over and met with patient, addressed all questions and patient was accepting to placing hands in ice to help prevent cumulative nail breakdown.  During administration of Taxotere patient tolerated ice well.

## 2015-10-27 ENCOUNTER — Other Ambulatory Visit: Payer: Self-pay | Admitting: Oncology

## 2015-10-28 ENCOUNTER — Ambulatory Visit (HOSPITAL_BASED_OUTPATIENT_CLINIC_OR_DEPARTMENT_OTHER): Payer: Medicaid Other | Admitting: Oncology

## 2015-10-28 ENCOUNTER — Other Ambulatory Visit (HOSPITAL_BASED_OUTPATIENT_CLINIC_OR_DEPARTMENT_OTHER): Payer: Medicaid Other

## 2015-10-28 ENCOUNTER — Ambulatory Visit (HOSPITAL_BASED_OUTPATIENT_CLINIC_OR_DEPARTMENT_OTHER): Payer: Medicaid Other

## 2015-10-28 ENCOUNTER — Encounter: Payer: Self-pay | Admitting: Oncology

## 2015-10-28 ENCOUNTER — Ambulatory Visit: Payer: Medicaid Other

## 2015-10-28 VITALS — BP 97/57 | HR 97 | Temp 97.7°F | Resp 17 | Wt 124.8 lb

## 2015-10-28 DIAGNOSIS — E876 Hypokalemia: Secondary | ICD-10-CM | POA: Diagnosis not present

## 2015-10-28 DIAGNOSIS — R197 Diarrhea, unspecified: Secondary | ICD-10-CM | POA: Diagnosis not present

## 2015-10-28 DIAGNOSIS — R1111 Vomiting without nausea: Secondary | ICD-10-CM

## 2015-10-28 DIAGNOSIS — C50911 Malignant neoplasm of unspecified site of right female breast: Secondary | ICD-10-CM

## 2015-10-28 DIAGNOSIS — C7951 Secondary malignant neoplasm of bone: Principal | ICD-10-CM

## 2015-10-28 DIAGNOSIS — D6481 Anemia due to antineoplastic chemotherapy: Secondary | ICD-10-CM

## 2015-10-28 DIAGNOSIS — Z95828 Presence of other vascular implants and grafts: Secondary | ICD-10-CM

## 2015-10-28 DIAGNOSIS — H905 Unspecified sensorineural hearing loss: Secondary | ICD-10-CM | POA: Diagnosis not present

## 2015-10-28 DIAGNOSIS — G620 Drug-induced polyneuropathy: Secondary | ICD-10-CM

## 2015-10-28 DIAGNOSIS — R112 Nausea with vomiting, unspecified: Secondary | ICD-10-CM

## 2015-10-28 DIAGNOSIS — C50919 Malignant neoplasm of unspecified site of unspecified female breast: Secondary | ICD-10-CM

## 2015-10-28 DIAGNOSIS — T451X5A Adverse effect of antineoplastic and immunosuppressive drugs, initial encounter: Secondary | ICD-10-CM

## 2015-10-28 LAB — COMPREHENSIVE METABOLIC PANEL
ALT: 12 U/L (ref 0–55)
AST: 12 U/L (ref 5–34)
Albumin: 3.3 g/dL — ABNORMAL LOW (ref 3.5–5.0)
Alkaline Phosphatase: 134 U/L (ref 40–150)
Anion Gap: 9 mEq/L (ref 3–11)
BUN: 8.6 mg/dL (ref 7.0–26.0)
CALCIUM: 8.5 mg/dL (ref 8.4–10.4)
CHLORIDE: 107 meq/L (ref 98–109)
CO2: 24 meq/L (ref 22–29)
CREATININE: 0.8 mg/dL (ref 0.6–1.1)
EGFR: 87 mL/min/{1.73_m2} — ABNORMAL LOW (ref >90)
GLUCOSE: 101 mg/dL (ref 70–140)
POTASSIUM: 4.3 meq/L (ref 3.5–5.1)
SODIUM: 140 meq/L (ref 136–145)
Total Bilirubin: 0.41 mg/dL (ref 0.20–1.20)
Total Protein: 6 g/dL — ABNORMAL LOW (ref 6.4–8.3)

## 2015-10-28 LAB — CBC WITH DIFFERENTIAL/PLATELET
BASO%: 0.1 % (ref 0.0–2.0)
Basophils Absolute: 0 10*3/uL (ref 0.0–0.1)
EOS%: 0 % (ref 0.0–7.0)
Eosinophils Absolute: 0 10*3/uL (ref 0.0–0.5)
HEMATOCRIT: 30.7 % — AB (ref 34.8–46.6)
HGB: 10.1 g/dL — ABNORMAL LOW (ref 11.6–15.9)
LYMPH#: 2.1 10*3/uL (ref 0.9–3.3)
LYMPH%: 9.3 % — ABNORMAL LOW (ref 14.0–49.7)
MCH: 31.3 pg (ref 25.1–34.0)
MCHC: 32.9 g/dL (ref 31.5–36.0)
MCV: 95 fL (ref 79.5–101.0)
MONO#: 0.7 10*3/uL (ref 0.1–0.9)
MONO%: 3.3 % (ref 0.0–14.0)
NEUT#: 19.3 10*3/uL — ABNORMAL HIGH (ref 1.5–6.5)
NEUT%: 87.3 % — AB (ref 38.4–76.8)
Platelets: 195 10*3/uL (ref 145–400)
RBC: 3.23 10*6/uL — AB (ref 3.70–5.45)
RDW: 15.7 % — ABNORMAL HIGH (ref 11.2–14.5)
WBC: 22.1 10*3/uL — ABNORMAL HIGH (ref 3.9–10.3)

## 2015-10-28 MED ORDER — SODIUM CHLORIDE 0.9 % IV SOLN
Freq: Once | INTRAVENOUS | Status: AC
  Administered 2015-10-28: 16:00:00 via INTRAVENOUS

## 2015-10-28 MED ORDER — LIDOCAINE-PRILOCAINE 2.5-2.5 % EX CREA: 1.0000 "application " | TOPICAL_CREAM | CUTANEOUS | 1 refills | Status: DC | PRN

## 2015-10-28 MED ORDER — SODIUM CHLORIDE 0.9 % IJ SOLN
10.0000 mL | INTRAMUSCULAR | Status: DC | PRN
  Administered 2015-10-28: 10 mL via INTRAVENOUS
  Filled 2015-10-28: qty 10

## 2015-10-28 MED ORDER — HEPARIN SOD (PORK) LOCK FLUSH 100 UNIT/ML IV SOLN
500.0000 [IU] | Freq: Once | INTRAVENOUS | Status: AC | PRN
  Administered 2015-10-28: 500 [IU] via INTRAVENOUS
  Filled 2015-10-28: qty 5

## 2015-10-28 MED ORDER — DEXAMETHASONE 4 MG PO TABS: ORAL_TABLET | ORAL | 2 refills | Status: DC

## 2015-10-28 NOTE — Progress Notes (Signed)
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 She had cycle 4 taxotere, herceptin and dose reduced perjeta on 10-18-15, with on pro neulasta. (Perjeta held cycle 3 due to diarrhea cycles 1 and 2). Last zometa was 10-07-15.  Note patient refused to have IVF scheduled day after cycle 4 chemo.  Patient has had fatigue and GI side effects primarily since cycle 4 treatment on 10-18-15. She has had diarrhea 1-2x daily, less than with full dose Perjeta, but did not remember to use the imodium that she has available for diarrhea. She vomited after trying to eat pizza and after beef stroganoff, is able to eat soup and drink [employer], tho probably not adequate po fluids. She has been worked in for IVF here today. She has had soreness in LE x 48 hrs, likely related to neulasta. She denies SOB, fever, bleeding, LE swelling. Bowels are moving. No new or different pain otherwise. No problems with PAC. Slight peripheral neuropathy not bothersome. No complaints re nails or increased lacrimation. Remainder of 10 point Review of Systems negative.    PAC by IR 08-06-15 Feraheme 06-29-15 and 07-22-15 Genetics testing negative by Breast Ovarian panel 03-2015 [facility] 03-15-15 heterogeneously dense tissue otherwise negative. CA 2729 on 06-26-15: 72.5 Flu vaccine 10-14-15  ONCOLOGIC HISTORY Patient had 1.4 cm poorly differentiated right breast carcinoma diagnosed in Nov 1999 at age 26, 1/7 axillary nodes involved, ER/PR and HER 2 positive. She had mastectomy with the 7 axillary node evaluation, 4 cycles of adriamycin/ cytoxan followed by taxotere, then five years of  tamoxifen thru June 2005 (no herceptin in 1999). She did not have local radiation. She had right TRAM reconstruction. She had bilateral salpingoophorectomy in May 2005. She was briefly on aromatase inhibitor after tamoxifen, but discontinued this herself due to poor tolerance. Most recent left mammogram was done as screening on 04-15-2012 with heterogeneously dense tissue and questioned asymmetry, followed by diagnostic left mamogram on 04-28-12 which had no findings of concern. 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 She had nailing of impending left femur fracture by Dr [name] on 06-26-15. Pathology [id number] from 06-26-15 showed metastatic adenocarcinoma , strongly ER positive 95%, PR positive 2%, HER 2 positive 3+. Plain xrays suggested T1 and left 4th rib involvement, tho these also did not show on bone scan 07-01-15; like unremarkable CT, bone scan showed only activity in left femur area of surgery. PET 07-19-15 showed extensive bone involvement axial and proximal appendicular skeleton, "severe multifocal osseous metastatic disease, much greater than anticipated based on prior imaging", involved nodes right subpectoral and right internal mammary, and no distant extraosseous mets.  She had radiation to left femur 30 cGy completed 07-26-15. She continued IV bisphosphonate with zometa on 08-07-15 and had first taxotere herceptin perjeta on 08-09-15. Perjeta held cycle 3 after diarrhea cycles 1 and 2. Perjeta resumed at 50% dosing cycle 4.    Objective:  Vital signs in last 24 hours:  BP (!) 97/57 (BP Location: Left Arm, Patient Position: Sitting)   Pulse 97   Temp 97.7 F (36.5 C) (Oral)   Resp 17  Wt 124 lb 12.8 oz (56.6 kg)   SpO2 100%   BMI 19.55 kg/m  Weight down 7 lbs.  Alert, oriented and appropriate, communicates easily with interpreter, respirations not labored, does not  appear in any acute discomfort. Using WC for office.  Complete alopecia  HEENT:PERRL, sclerae not icteric. Oral mucosa moist without lesions, posterior pharynx clear.  Neck supple. No JVD.  Lymphatics:no supraclavicular adenopathy Resp: clear to auscultation bilaterally and normal percussion bilaterally Cardio: regular rate and rhythm. No gallop. GI: soft, nontender including epigastrium, not distended, no mass or organomegaly. Some bowel sounds.  Musculoskeletal/ Extremities: UE/ LE without pitting edema, cords, tenderness Neuro: no significant peripheral neuropathy. Congenital deafness. PSYCH appropriate mood and affect. Skin without rash, ecchymosis, petechiae, no tenting PAC site not remarkable  Lab Results:  Results for orders placed or performed in visit on 10/28/15  CBC with Differential  Result Value Ref Range   WBC 22.1 (H) 3.9 - 10.3 10e3/uL   NEUT# 19.3 (H) 1.5 - 6.5 10e3/uL   HGB 10.1 (L) 11.6 - 15.9 g/dL   HCT 30.7 (L) 34.8 - 46.6 %   Platelets 195 145 - 400 10e3/uL   MCV 95.0 79.5 - 101.0 fL   MCH 31.3 25.1 - 34.0 pg   MCHC 32.9 31.5 - 36.0 g/dL   RBC 3.23 (L) 3.70 - 5.45 10e6/uL   RDW 15.7 (H) 11.2 - 14.5 %   lymph# 2.1 0.9 - 3.3 10e3/uL   MONO# 0.7 0.1 - 0.9 10e3/uL   Eosinophils Absolute 0.0 0.0 - 0.5 10e3/uL   Basophils Absolute 0.0 0.0 - 0.1 10e3/uL   NEUT% 87.3 (H) 38.4 - 76.8 %   LYMPH% 9.3 (L) 14.0 - 49.7 %   MONO% 3.3 0.0 - 14.0 %   EOS% 0.0 0.0 - 7.0 %   BASO% 0.1 0.0 - 2.0 %  Comprehensive metabolic panel  Result Value Ref Range   Sodium 140 136 - 145 mEq/L   Potassium 4.3 3.5 - 5.1 mEq/L   Chloride 107 98 - 109 mEq/L   CO2 24 22 - 29 mEq/L   Glucose 101 70 - 140 mg/dl   BUN 8.6 7.0 - 26.0 mg/dL   Creatinine 0.8 0.6 - 1.1 mg/dL   Total Bilirubin 0.41 0.20 - 1.20 mg/dL   Alkaline Phosphatase 134 40 - 150 U/L   AST 12 5 - 34 U/L   ALT 12 0 - 55 U/L   Total Protein 6.0 (L) 6.4 - 8.3 g/dL   Albumin 3.3 (L) 3.5 - 5.0 g/dL   Calcium 8.5 8.4 -  10.4 mg/dL   Anion Gap 9 3 - 11 mEq/L   EGFR 87 (L) >90 ml/min/1.73 m2   Last CA 2729 was 93 on 10-07-15, this having been 110 on 08-29-15  Studies/Results:  No results found.  Medications: I have reviewed the patient's current medications. Refill decadron and EMLA. She has imodium in her purse, reminded to use for Perjeta diarrhea.  She requests that this office refill depakote. Per [facility] [address] [phone]) 250 mg ER two daily last filled on 07-30-15 which was last of 1 year refills from prescription by Evlyn Courier at [facility] 07-30-14.   Patient reports that neurology told her this should be refilled by oncology (-?). We will follow up with [facility]      DISCUSSION Interval history as above. Reviewed side effects from Perjeta, chemo, neulasta. Encouraged her to push po fluids and use Carnation Instant if unable to tolerate regular diet  for several days after chemo.  Medications as above IVF orders added for today EMR schedule not correct since treatment delays, revised schedule to be given to patient with interpreter today.  Reminded patient that she will likely feel better again just given a little longer out from this chemo. Reminded her that she is clinically responding to treatment compared with situation at initial diagnosis of the metastatic disease.   Assessment/Plan: 1.Metastatic ER+, HER 2 + breast cancer extensively involving axial and appendicular skeleton: history of T1N1 right breast carcinoma age 21, ER/PR +, HER 2 + inNov 1999, no HER 2 therapy in 1999. Post nailing of impending fracture left femur by Dr [name] 06-26-15 and localradiation. FirstHerceptin, Perjeta and taxotere 08-09-15, with on pro neulasta; zometa every 4 weeks. Perjeta held cycle 3 due to diarrhea, resumed with cycle 4 50% dosing. She will have treatment again 10-27 if ANC >=1.5 and plt >=100k and if EF ok for Herceptin,  with on pro neulasta. Will keep Perjeta dose at 50%. I will see  her with IVF and zometa 11-6.  Consider change to AI + herceptin perjeta after 6 cycles taxotere.  2. Echocardiogram 08-05-15 with EF 60-65% , prior adriamycin < 300 mg/m2, no chest radiation. 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 Husband also deaf. All communication needs to be very clear 4.Seen by [facility] last 07-2014 for vertigo and HA, on depakote by neurology for HA. Note she does not tolerate lying on stomach due to vertigo.  5.post laparoscopic cholecystectomy 2004 6.right TRAM with original breast cancer 1999 7.premature surgical menopause, post BSO 2029fr pelvic pain and ovarian cysts, likely was also beneficial from standpoint of the breast cancer.  8..claustrophobia: required full sedation for hip MRI. 9. Marked iron deficiency anemia: feraheme given 06-29-15 and 07-22-15. Chemo anemia now, not symptomatic to require transfusion 10. PAC placed by IR 08-06-15. Peripheral IV access limited to LUE due to breast cancer surgery/ radiation 11.Possible HA with zofran, tho we had previously thought this was not the case. Antiemetics unfortunately very limited by med intolerances, but did well cycle 3 with EMEND and zofran. Note she has refusedto use ativan SL, discussed.  Note on depakote by neurology for HA 12.diarrhea fromperjeta,C diff negative. Less diarrhea with decreased dose of perjeta, add imodium prn 13.has been given information on advance directives.  14..hypokalemia from severe Perjeta diarrhea previously. K 4.3 today. 15. flu vaccine given 10-14-15       LEvlyn Clines MD   10/28/2015, 3:23 PM

## 2015-10-28 NOTE — Patient Instructions (Signed)

## 2015-10-29 ENCOUNTER — Encounter (HOSPITAL_COMMUNITY): Payer: Self-pay

## 2015-10-29 ENCOUNTER — Emergency Department (HOSPITAL_COMMUNITY)
Admission: EM | Admit: 2015-10-29 | Discharge: 2015-10-30 | Disposition: A | Discharge status: Home / Self Care | Payer: Medicaid Other | Attending: Emergency Medicine | Admitting: Emergency Medicine

## 2015-10-29 DIAGNOSIS — E860 Dehydration: Secondary | ICD-10-CM | POA: Insufficient documentation

## 2015-10-29 DIAGNOSIS — R112 Nausea with vomiting, unspecified: Secondary | ICD-10-CM | POA: Diagnosis not present

## 2015-10-29 DIAGNOSIS — R1013 Epigastric pain: Secondary | ICD-10-CM | POA: Insufficient documentation

## 2015-10-29 DIAGNOSIS — Z85830 Personal history of malignant neoplasm of bone: Secondary | ICD-10-CM | POA: Insufficient documentation

## 2015-10-29 DIAGNOSIS — Z853 Personal history of malignant neoplasm of breast: Secondary | ICD-10-CM | POA: Insufficient documentation

## 2015-10-29 DIAGNOSIS — R197 Diarrhea, unspecified: Secondary | ICD-10-CM

## 2015-10-29 LAB — URINALYSIS, ROUTINE W REFLEX MICROSCOPIC
BILIRUBIN URINE: NEGATIVE
Glucose, UA: NEGATIVE mg/dL
HGB URINE DIPSTICK: NEGATIVE
Ketones, ur: NEGATIVE mg/dL
Leukocytes, UA: NEGATIVE
NITRITE: NEGATIVE
PROTEIN: NEGATIVE mg/dL
Specific Gravity, Urine: 1.018 (ref 1.005–1.030)
pH: 7 (ref 5.0–8.0)

## 2015-10-29 LAB — CBC
HCT: 30.2 % — ABNORMAL LOW (ref 36.0–46.0)
HEMOGLOBIN: 9.7 g/dL — AB (ref 12.0–15.0)
MCH: 30.8 pg (ref 26.0–34.0)
MCHC: 32.1 g/dL (ref 30.0–36.0)
MCV: 95.9 fL (ref 78.0–100.0)
PLATELETS: 209 10*3/uL (ref 150–400)
RBC: 3.15 MIL/uL — AB (ref 3.87–5.11)
RDW: 15.6 % — ABNORMAL HIGH (ref 11.5–15.5)
WBC: 22.9 10*3/uL — ABNORMAL HIGH (ref 4.0–10.5)

## 2015-10-29 LAB — COMPREHENSIVE METABOLIC PANEL
ALK PHOS: 112 U/L (ref 38–126)
ALT: 16 U/L (ref 14–54)
ANION GAP: 9 (ref 5–15)
AST: 14 U/L — ABNORMAL LOW (ref 15–41)
Albumin: 3.4 g/dL — ABNORMAL LOW (ref 3.5–5.0)
BILIRUBIN TOTAL: 0.5 mg/dL (ref 0.3–1.2)
BUN: 7 mg/dL (ref 6–20)
CALCIUM: 8.6 mg/dL — AB (ref 8.9–10.3)
CO2: 24 mmol/L (ref 22–32)
CREATININE: 0.84 mg/dL (ref 0.44–1.00)
Chloride: 105 mmol/L (ref 101–111)
GFR calc non Af Amer: >60 mL/min (ref >60)
Glucose, Bld: 97 mg/dL (ref 65–99)
Potassium: 4.1 mmol/L (ref 3.5–5.1)
SODIUM: 138 mmol/L (ref 135–145)
TOTAL PROTEIN: 6.6 g/dL (ref 6.5–8.1)

## 2015-10-29 LAB — LIPASE, BLOOD: Lipase: 25 U/L (ref 11–51)

## 2015-10-29 NOTE — ED Notes (Signed)
Interpreter will arrive at 9:30 pm

## 2015-10-29 NOTE — ED Triage Notes (Signed)
Patient here with 2 weeks of abdominal cramping and pain x 2 weeks with vomiting and diarrhea. Decreased intake due to the nausea and vomiting. Patient is deaf

## 2015-10-30 ENCOUNTER — Other Ambulatory Visit: Payer: Self-pay

## 2015-10-30 ENCOUNTER — Emergency Department (HOSPITAL_COMMUNITY): Payer: Medicaid Other

## 2015-10-30 ENCOUNTER — Telehealth: Payer: Self-pay | Admitting: *Deleted

## 2015-10-30 MED ORDER — FENTANYL CITRATE (PF) 100 MCG/2ML IJ SOLN
50.0000 ug | Freq: Once | INTRAMUSCULAR | Status: AC
  Administered 2015-10-30: 50 ug via INTRAVENOUS
  Filled 2015-10-30: qty 2

## 2015-10-30 MED ORDER — IOPAMIDOL (ISOVUE-300) INJECTION 61%
100.0000 mL | Freq: Once | INTRAVENOUS | Status: AC | PRN
  Administered 2015-10-30: 100 mL via INTRAVENOUS

## 2015-10-30 MED ORDER — SODIUM CHLORIDE 0.9 % IV BOLUS (SEPSIS)
1000.0000 mL | Freq: Once | INTRAVENOUS | Status: AC
  Administered 2015-10-30: 1000 mL via INTRAVENOUS

## 2015-10-30 MED ORDER — IOPAMIDOL (ISOVUE-300) INJECTION 61%
INTRAVENOUS | Status: AC
  Filled 2015-10-30: qty 100

## 2015-10-30 MED ORDER — ONDANSETRON HCL 4 MG/2ML IJ SOLN
4.0000 mg | Freq: Once | INTRAMUSCULAR | Status: AC
  Administered 2015-10-30: 4 mg via INTRAVENOUS
  Filled 2015-10-30: qty 2

## 2015-10-30 NOTE — Telephone Encounter (Signed)
Pt called (using interpreter) to request refill on [facility] [address].  States she has been out for 2 days and it helps her headaches.   She also wants Dr. Marko Plume to know she was in the ED last night "very very sick."    VM left for Dr. Mariana Kaufman nurse, Barbaraann Share.  Please call pt back at [phone].

## 2015-10-30 NOTE — ED Notes (Signed)
Gave pt coke for PO challenge- pt states she cant drink it because it taste like metal  And requesting milk. I informed pt milk is not recommended d/t her already having GI upset. Pt states she understands but would still like milk. Pt tolerated milk well with no vomiting or pain but did have 1 episode of diarrhea.

## 2015-10-30 NOTE — ED Notes (Signed)
Dr. Christy Gentles and interpreter at bedside.

## 2015-10-30 NOTE — ED Provider Notes (Signed)
[facility] DEPT Provider Note   CSN: [id number] Arrival date & time: 10/29/15  0762  By signing my name below, I, Dolores Hoose, attest that this documentation has been prepared under the direction and in the presence of Ripley Fraise, MD . Electronically Signed: Dolores Hoose, Scribe. 10/30/2015. 12:25 AM.   History   Chief Complaint Chief Complaint  Patient presents with  . Abdominal Pain  . Emesis   The history is provided by the patient. A language interpreter was used.  Abdominal Pain   This is a new problem. The current episode started 12 to 24 hours ago. The problem occurs constantly. The problem has not changed since onset.The pain is associated with eating. The pain is located in the epigastric region. Associated symptoms include diarrhea and vomiting. Pertinent negatives include headaches. The symptoms are aggravated by eating. Nothing relieves the symptoms. Past medical history comments: Cancer.  Emesis   This is a new problem. The current episode started 12 to 24 hours ago. The problem has not changed since onset.There has been no fever. Associated symptoms include abdominal pain and diarrhea. Pertinent negatives include no cough and no headaches.    HPI Comments:  [name] is a 50 y.o. female who presents to the Emergency Department complaining of sudden-onset constant emesis beginning yesterday. Pt  Is currently being treated with chemotherapy for stage 4 bone cancer and she has not been able to keep food down for 2 weeks. She also reports associated abdominal pain, dizziness, nausea, and diarrhea . When asked about her abdominal pain, she qualifies it as a tightness located in her epigastric region. She denies any headaches or coughing. Pt has been given fluids but has not been able to keep her anything down. Pt is being followed by Oncology (Dr. Lynett Grimes) at the [facility].   Past Medical History:  Diagnosis Date  . Abnormal Pap smear   .  Anxiety   . Breast cancer ([place])   . Cancer ([place]) 1999   breast-s/p mastectomy, chemo, rad  . CIN I (cervical intraepithelial neoplasia I) 2003   by colpo  . Congenital deafness   . Deaf   . Depression   . Radiation 07/12/15-07/26/15   left femur 30 Gy  . S/P bilateral oophorectomy     Patient Active Problem List   Diagnosis Date Noted  . Port catheter in place 09/26/2015  . Chemotherapy induced nausea and vomiting 08/13/2015  . Cancer related pain 08/13/2015  . Portacath in place 08/13/2015  . Drug allergy, multiple 08/13/2015  . HER-2 positive carcinoma of breast 07/26/2015  . [name] ([place]) 07/14/2015  . Other iron deficiency anemias 07/14/2015  . Claustrophobia 07/14/2015  . Status post laparoscopic cholecystectomy 07/14/2015  . S/P TRAM (transverse rectus abdominis muscle) flap breast reconstruction 07/14/2015  . Thyroid nodule 07/09/2015  . Constipation due to pain medication   . 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 Left hip pain   . Metastatic bone cancer ([place])   . Depression   . Generalized anxiety disorder   . Esophageal reflux   . Bony metastasis ([place]) 06/25/2015  . Breast cancer ([place]) 06/25/2015  . Genetic testing 04/04/2015  . Congenital deafness 02/26/2015  . Noncompliance with therapeutic plan 02/26/2015  . Hx of BSO (bilateral salpingo-oophorectomy) 02/26/2015  . Premature surgical menopause 02/26/2015  . Benign paroxysmal positional vertigo 07/24/2014  . Migraine 07/24/2014  . RLQ abdominal pain 09/30/2013  . Dysuria 09/30/2013  .  Hx of breast cancer 04/28/2013  . Colon cancer screening 04/28/2013  . Migraine without aura, with intractable migraine, so stated, without mention of status migrainosus 06/22/2012  . Dizziness 05/14/2011  . Pharyngitis, acute 09/04/2010  . BACK STRAIN, LUMBAR 01/21/2010  . DE QUERVAIN'S TENOSYNOVITIS, LEFT WRIST 10/07/2009  . EMESIS 02/05/2009  . ABDOMINAL PAIN, GENERALIZED 02/05/2009  .  DIZZINESS, CHRONIC 01/25/2007  . INSOMNIA 01/25/2007  . Hearing loss 03/11/2006  . CERVICAL DYSPLASIA 03/11/2006  . MENSTRUATION, PAINFUL 03/11/2006    Past Surgical History:  Procedure Laterality Date  . ABDOMINAL HYSTERECTOMY    . INTRAMEDULLARY (IM) NAIL INTERTROCHANTERIC Left 06/26/2015   Procedure: LEFT BIOMET LONG AFFIXS NAIL;  Surgeon: [name], MD;  Location: [facility];  Service: Orthopedics;  Laterality: Left;  . IR GENERIC HISTORICAL  08/06/2015   IR US GUIDE VASC ACCESS LEFT 08/06/2015 Aletta Edouard, MD [facility]-INTERV RAD  . IR GENERIC HISTORICAL  08/06/2015   IR FLUORO GUIDE CV LINE LEFT 08/06/2015 Aletta Edouard, MD [facility]  . MASTECTOMY     right breast  . MASTECTOMY    . OVARIAN CYST REMOVAL    . RADIOLOGY WITH ANESTHESIA Left 06/25/2015   Procedure: MRI OF LEFT HIP WITH OR WITHOUT CONTRAST;  Surgeon: Medication Radiologist, MD;  Location: [facility];  Service: Radiology;  Laterality: Left;  DR. MCINTYRE/MRI  . TUBAL LIGATION      OB History    Gravida Para Term Preterm AB Living   _0 0 2   SAB TAB Ectopic Multiple Live Births   0 0 0 0         Home Medications    Prior to Admission medications   Medication Sig Start Date End Date Taking? Authorizing Provider  dexamethasone (DECADRON) 4 MG tablet Take 2 tablets with food twice a day for 3 days. Begining the day prior to Taxotere chemotherapy. 10/28/15   Lennis Marion Downer, MD  divalproex (DEPAKOTE) 250 MG DR tablet Take 250 mg by mouth at bedtime. Takes 2 tablets    Historical Provider, MD  HYDROcodone-acetaminophen (NORCO) 7.5-325 MG tablet Take 1 tablet by mouth every 4-6 hours as needed for pain Patient not taking: Reported on 10/28/2015 08/19/15   Gordy Levan, MD  Ibuprofen (ADVIL) 200 MG CAPS Take 2 capsules by mouth as needed. Reported on 07/26/2015    Historical Provider, MD  lidocaine-prilocaine (EMLA) cream Apply 1 application topically as needed. Apply to Porta-Cath 1-2 hours prior to access as  directed. 10/28/15   Lennis Marion Downer, MD  loperamide (IMODIUM A-D) 2 MG tablet Take 1-2 tablets after each loose stool. UP TO 6 IN  24 HRS. Patient not taking: Reported on 10/28/2015 09/06/15   Gordy Levan, MD  LORazepam (ATIVAN) 1 MG tablet Place one tablet under the tongue or swallow every 4-6 hours as needed for nausea. (THIS IS THE SAME STRENGTH AS 2 OF YOUR 0.5 MG TABLETS) Patient not taking: Reported on 10/28/2015 09/06/15   Lennis P Livesay, MD  ondansetron (ZOFRAN) 8 MG tablet Take 1 tablet (8 mg total) by mouth every 8 (eight) hours as needed for nausea or vomiting. Patient not taking: Reported on 10/28/2015 08/29/15   Lennis P Marko Plume, MD  phenol (CHLORASEPTIC) 1.4 % LIQD Use as directed 1-2 sprays in the mouth or throat as needed for throat irritation / pain.    Historical Provider, MD  potassium chloride (K-DUR) 10 MEQ tablet Take 2 tablets (20 mEq total) by mouth daily. 10/04/15  Lennis Marion Downer, MD    Family History Family History  Problem Relation Age of Onset  . Hypertension Mother   . Seizures Mother   . Alzheimer's disease Maternal Uncle   . Pancreatitis Maternal Grandmother   . Heart attack Maternal Grandfather     Social History Social History  Substance Use Topics  . Smoking status: Never Smoker  . Smokeless tobacco: Never Used  . Alcohol use No     Allergies   Chlorhexidine; Promethazine hcl; and Diphenhydramine hcl   Review of Systems Review of Systems  Respiratory: Negative for cough.   Gastrointestinal: Positive for abdominal pain, diarrhea and vomiting.  Neurological: Positive for dizziness. Negative for headaches.  All other systems reviewed and are negative.    Physical Exam Updated Vital Signs BP (!) 112/49 (BP Location: Right Arm)   Pulse 83   Temp 98.1 F (36.7 C)   Resp 18   SpO2 100%   Physical Exam CONSTITUTIONAL: Chronically ill appearing HEAD: Normocephalic/atraumatic EYES: EOMI/PERRL ENMT: Mucous membranes dry. NECK:  supple no meningeal signs SPINE/BACK:entire spine nontender CV: S1/S2 noted, no murmurs/rubs/gallops noted LUNGS: Lungs are clear to auscultation bilaterally, no apparent distress ABDOMEN: soft, moderate epigastric tenderness, no rebound or guarding, bowel sounds noted throughout abdomen GU:no cva tenderness NEURO: Pt is awake/alert/appropriate, moves all extremitiesx4.  No facial droop.   EXTREMITIES: pulses normal/equal, full ROM SKIN: warm, color normal PSYCH: no abnormalities of mood noted, alert and oriented to situation   ED Treatments / Results  DIAGNOSTIC STUDIES:  Oxygen Saturation is 100% on RA, normal by my interpretation.    COORDINATION OF CARE:  12:27 AM Discussed treatment plan with pt at bedside and pt agreed to plan.  Labs (all labs ordered are listed, but only abnormal results are displayed) Labs Reviewed  COMPREHENSIVE METABOLIC PANEL - Abnormal; Notable for the following:       Result Value   Calcium 8.6 (*)    Albumin 3.4 (*)    AST 14 (*)    All other components within normal limits  CBC - Abnormal; Notable for the following:    WBC 22.9 (*)    RBC 3.15 (*)    Hemoglobin 9.7 (*)    HCT 30.2 (*)    RDW 15.6 (*)    All other components within normal limits  LIPASE, BLOOD  URINALYSIS, ROUTINE W REFLEX MICROSCOPIC (NOT AT [facility])    EKG  EKG Interpretation  Date/Time:  Wednesday October 30 2015 00:17:20 EDT Ventricular Rate:  76 PR Interval:    QRS Duration: 95 QT Interval:  391 QTC Calculation: 440 R Axis:   13 Text Interpretation:  Sinus rhythm LVH with secondary repolarization abnormality No significant change since last tracing Confirmed by Christy Gentles  MD, Elenore Rota ([id number]) on 10/30/2015 12:34:47 AM Also confirmed by Christy Gentles  MD, [place] ([id number]), editor [place]  CCT, [place] ([id number])  on 10/30/2015 6:58:05 AM       Radiology Ct Abdomen Pelvis W Contrast  Result Date: 10/30/2015 CLINICAL DATA:  Initial evaluation for acute epigastric pain.  Diarrhea, vomiting. EXAM: CT ABDOMEN AND PELVIS WITH CONTRAST TECHNIQUE: Multidetector CT imaging of the abdomen and pelvis was performed using the standard protocol following bolus administration of intravenous contrast. CONTRAST:  175m ISOVUE-300 IOPAMIDOL (ISOVUE-300) INJECTION 61% COMPARISON:  Prior CT from 06/10/2015. FINDINGS: Lower chest: Mild hazy subsegmental atelectasis seen dependently within the visualized lung bases. Visualized lung bases are otherwise clear. Hepatobiliary: Sub cm hypodensity within the central aspect the liver noted, too small  the characterize, but stable from prior studies. Again, this likely reflects a benign lesion. Liver otherwise unremarkable. Gallbladder surgically absent. No biliary dilatation. Pancreas: Pancreas within normal limits. Spleen: Spleen within normal limits. Adrenals/Urinary Tract: Adrenal glands are normal. Kidneys equal in size with symmetric enhancement. Note 11 mm hypodense lesion within the left kidney measures intermediate density (51 pounds foot pain and), indeterminate. No other focal renal mass. No nephrolithiasis or hydronephrosis. Ureters are of normal caliber without acute abnormality. Bladder within normal limits. Stomach/Bowel: Stomach within normal limits. Few mildly prominent loops of bowel seen scattered throughout the abdomen measuring up to 2.2 cm. Scattered intraluminal fluid density with a few air-fluid levels. No overt evidence for obstruction. Fluid density present within the distal colon as well. Findings favored to reflect sequela of acute enteritis/diarrheal illness. No other significant inflammatory changes seen about the bowels. No findings to suggest acute appendicitis. Vascular/Lymphatic: Normal intravascular enhancement seen throughout the intra-abdominal aorta and its branch vessels. No adenopathy. Reproductive: Uterus within normal limits. Ovaries not discretely identified. Other: Trace free fluid within the pelvis. No free  intraperitoneal air. Musculoskeletal: No acute osseous abnormality. Few scattered lucent lesions present within the pelvis, most prominently seen within the right iliac wing and measures 16 mm (series 2, image 59). Few additional lesions noted within the spine. Findings likely related to known widespread osseous metastases. Intra medullary fixation rod new from most recent CT from 06/10/2015. Hazy soft tissue stranding lateral to the left hip (Series 2, image 77), suspected to be postoperative in nature. IMPRESSION: 1. Few mildly prominent fluid-filled loops of small bowel scattered throughout the abdomen, with intraluminal fluid density within the distal colon. Given the provided history, findings are suggestive of acute enteritis/diarrheal illness. 2. No other acute intra-abdominal or pelvic process identified. 3. Widespread osseous metastatic disease, better evaluated on most recent PET-CT from 07/19/2015. No pathologic fracture or other complication. 4. 11 mm hypodensity within the left kidney. This lesion measures intermediate density, and is indeterminate. While this lesion is similar in size relative to recent studies, this is increased in size relative to prior study from 2012. Further evaluation with dedicated renal mass protocol CT and/or MRI is recommended for complete characterization. Electronically Signed   By: Jeannine Boga M.D.   On: 10/30/2015 03:00    Procedures Procedures (including critical care time)  Medications Ordered in ED Medications  iopamidol (ISOVUE-300) 61 % injection (not administered)  iopamidol (ISOVUE-300) 61 % injection (not administered)  sodium chloride 0.9 % bolus 1,000 mL (0 mLs Intravenous Stopped 10/30/15 0452)  fentaNYL (SUBLIMAZE) injection 50 mcg (50 mcg Intravenous Given 10/30/15 0204)  ondansetron (ZOFRAN) injection 4 mg (4 mg Intravenous Given 10/30/15 0204)  iopamidol (ISOVUE-300) 61 % injection 100 mL (100 mLs Intravenous Contrast Given 10/30/15  0232)  fentaNYL (SUBLIMAZE) injection 50 mcg (50 mcg Intravenous Given 10/30/15 0250)     Initial Impression / Assessment and Plan / ED Course  I have reviewed the triage vital signs and the nursing notes.  Pertinent labs results that were available during my care of the patient were reviewed by me and considered in my medical decision making (see chart for details).  Clinical Course    Pt with focal abd tenderness CT imaging negative for acute process She is improved She is taking PO fluids She feels comfortable with d/c home SL interpreter at bedside to help with all communication   Final Clinical Impressions(s) / ED Diagnoses   Final diagnoses:  Nausea vomiting and diarrhea  Dehydration  New Prescriptions New Prescriptions   No medications on file  I personally performed the services described in this documentation, which was scribed in my presence. The recorded information has been reviewed and is accurate.        Ripley Fraise, MD 10/30/15 [phone]

## 2015-10-31 ENCOUNTER — Other Ambulatory Visit (HOSPITAL_COMMUNITY): Payer: Medicaid Other

## 2015-11-01 ENCOUNTER — Telehealth: Payer: Self-pay

## 2015-11-01 ENCOUNTER — Ambulatory Visit: Payer: Medicaid Other

## 2015-11-01 ENCOUNTER — Encounter: Payer: Self-pay | Admitting: Oncology

## 2015-11-01 ENCOUNTER — Other Ambulatory Visit: Payer: Self-pay | Admitting: Oncology

## 2015-11-01 MED ORDER — DIVALPROEX SODIUM 250 MG PO DR TAB: 500.0000 mg | DELAYED_RELEASE_TABLET | Freq: Every day | ORAL | 0 refills | Status: DC

## 2015-11-01 NOTE — Progress Notes (Signed)
Medical Oncology  Patient requesting depakote, which has been prescribed from [facility]. That office is closed. Patient reports that neurology suggested that this MD fill her depakote. If able to confirm dose and if this has been an active medication ongoing, ok for this office to fill x 1 month only. Will need to be filled by neurology or PCP from here.  Scheduling message sent now to get her back to neurology.   Godfrey Pick, MD

## 2015-11-01 NOTE — Telephone Encounter (Signed)
Called echo scheduler. Pt cancelled echo after going to ED b/c the physician in ED told her to go home and take it easy.  Called pt and lvm via interpreter asking how she is feeling and if she needs any help today to call.

## 2015-11-01 NOTE — Telephone Encounter (Signed)
S/w Dr Marko Plume. Reordered depakote for 1 month. We will arrange appt with [facility] so they can assess pt and manage depakote.  LVM with pt via sign language interpreter.

## 2015-11-01 NOTE — Telephone Encounter (Signed)
[name] called stating she is requesting a refill on her Depakote. She depends on it for her dizziness. Dizziness is why she has been nauseated/vomiting and why she went to Ed. She stated a Dr Marlon Pel, a neurologist was who prescribed her depakote. She said Dr Marlon Pel will not refill it stating she needs to get it from Dr Marko Plume.   Investigation: she has been getting her Depakote Rx from [facility]. From Dr Leonie Man and Rosine Abe NP. They are closed at 12 today.

## 2015-11-03 DIAGNOSIS — G620 Drug-induced polyneuropathy: Secondary | ICD-10-CM | POA: Insufficient documentation

## 2015-11-03 DIAGNOSIS — T451X5A Adverse effect of antineoplastic and immunosuppressive drugs, initial encounter: Secondary | ICD-10-CM

## 2015-11-03 NOTE — Progress Notes (Signed)
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 She had cycle 4 taxotere, herceptin and dose reduced perjeta on 10-18-15, with on pro neulasta. (Perjeta held cycle 3 due to diarrhea cycles 1 and 2). Last zometa was 10-07-15.  Note patient refused to have IVF scheduled day after cycle 4 chemo.  Patient has had fatigue and GI side effects primarily since cycle 4 treatment on 10-18-15. She has had diarrhea 1-2x daily, less than with full dose Perjeta, but did not remember to use the imodium that she has available for diarrhea. She vomited after trying to eat pizza and after beef stroganoff, is able to eat soup and drink [employer], tho probably not adequate po fluids. She has been worked in for IVF here today. She has had soreness in LE x 48 hrs, likely related to neulasta. She denies SOB, fever, bleeding, LE swelling. Bowels are moving. No new or different pain otherwise. No problems with PAC. Slight peripheral neuropathy not bothersome. No complaints re nails or increased lacrimation. Remainder of 10 point Review of Systems negative.    PAC by IR 08-06-15 Feraheme 06-29-15 and 07-22-15 Genetics testing negative by Breast Ovarian panel 03-2015 [facility] 03-15-15 heterogeneously dense tissue otherwise negative. CA 2729 on 06-26-15: 72.5 Flu vaccine 10-14-15  ONCOLOGIC HISTORY Patient had 1.4 cm poorly differentiated right breast carcinoma diagnosed in Nov 1999 at age 65, 1/7 axillary nodes involved, ER/PR and HER 2 positive. She had mastectomy with the 7 axillary node evaluation, 4 cycles of adriamycin/ cytoxan followed by taxotere, then five years of  tamoxifen thru June 2005 (no herceptin in 1999). She did not have local radiation. She had right TRAM reconstruction. She had bilateral salpingoophorectomy in May 2005. She was briefly on aromatase inhibitor after tamoxifen, but discontinued this herself due to poor tolerance. Most recent left mammogram was done as screening on 04-15-2012 with heterogeneously dense tissue and questioned asymmetry, followed by diagnostic left mamogram on 04-28-12 which had no findings of concern. 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 She had nailing of impending left femur fracture by Dr [name] on 06-26-15. Pathology [phone] from 06-26-15 showed metastatic adenocarcinoma , strongly ER positive 95%, PR positive 2%, HER 2 positive 3+. Plain xrays suggested T1 and left 4th rib involvement, tho these also did not show on bone scan 07-01-15; like unremarkable CT, bone scan showed only activity in left femur area of surgery. PET 07-19-15 showed extensive bone involvement axial and proximal appendicular skeleton, "severe multifocal osseous metastatic disease, much greater than anticipated based on prior imaging", involved nodes right subpectoral and right internal mammary, and no distant extraosseous mets.  She had radiation to left femur 30 cGy completed 07-26-15. She continued IV bisphosphonate with zometa on 08-07-15 and had first taxotere herceptin perjeta on 08-09-15. Perjeta held cycle 3 after diarrhea cycles 1 and 2. Perjeta resumed at 50% dosing cycle 4.    Objective:  Vital signs in last 24 hours:  BP (!) 97/57 (BP Location: Left Arm, Patient Position: Sitting)   Pulse 97   Temp 97.7 F (36.5 C) (Oral)   Resp 17  Wt 124 lb 12.8 oz (56.6 kg)   SpO2 100%   BMI 19.55 kg/m  Weight down 7 lbs.  Alert, oriented and appropriate, communicates easily with interpreter, respirations not labored, does not  appear in any acute discomfort. Using WC for office.  Complete alopecia  HEENT:PERRL, sclerae not icteric. Oral mucosa moist without lesions, posterior pharynx clear.  Neck supple. No JVD.  Lymphatics:no supraclavicular adenopathy Resp: clear to auscultation bilaterally and normal percussion bilaterally Cardio: regular rate and rhythm. No gallop. GI: soft, nontender including epigastrium, not distended, no mass or organomegaly. Some bowel sounds.  Musculoskeletal/ Extremities: UE/ LE without pitting edema, cords, tenderness Neuro: no significant peripheral neuropathy. Congenital deafness. PSYCH appropriate mood and affect. Skin without rash, ecchymosis, petechiae, no tenting PAC site not remarkable  Lab Results:  Results for orders placed or performed in visit on 10/28/15  CBC with Differential  Result Value Ref Range   WBC 22.1 (H) 3.9 - 10.3 10e3/uL   NEUT# 19.3 (H) 1.5 - 6.5 10e3/uL   HGB 10.1 (L) 11.6 - 15.9 g/dL   HCT 30.7 (L) 34.8 - 46.6 %   Platelets 195 145 - 400 10e3/uL   MCV 95.0 79.5 - 101.0 fL   MCH 31.3 25.1 - 34.0 pg   MCHC 32.9 31.5 - 36.0 g/dL   RBC 3.23 (L) 3.70 - 5.45 10e6/uL   RDW 15.7 (H) 11.2 - 14.5 %   lymph# 2.1 0.9 - 3.3 10e3/uL   MONO# 0.7 0.1 - 0.9 10e3/uL   Eosinophils Absolute 0.0 0.0 - 0.5 10e3/uL   Basophils Absolute 0.0 0.0 - 0.1 10e3/uL   NEUT% 87.3 (H) 38.4 - 76.8 %   LYMPH% 9.3 (L) 14.0 - 49.7 %   MONO% 3.3 0.0 - 14.0 %   EOS% 0.0 0.0 - 7.0 %   BASO% 0.1 0.0 - 2.0 %  Comprehensive metabolic panel  Result Value Ref Range   Sodium 140 136 - 145 mEq/L   Potassium 4.3 3.5 - 5.1 mEq/L   Chloride 107 98 - 109 mEq/L   CO2 24 22 - 29 mEq/L   Glucose 101 70 - 140 mg/dl   BUN 8.6 7.0 - 26.0 mg/dL   Creatinine 0.8 0.6 - 1.1 mg/dL   Total Bilirubin 0.41 0.20 - 1.20 mg/dL   Alkaline Phosphatase 134 40 - 150 U/L   AST 12 5 - 34 U/L   ALT 12 0 - 55 U/L   Total Protein 6.0 (L) 6.4 - 8.3 g/dL   Albumin 3.3 (L) 3.5 - 5.0 g/dL   Calcium 8.5 8.4 -  10.4 mg/dL   Anion Gap 9 3 - 11 mEq/L   EGFR 87 (L) >90 ml/min/1.73 m2   Last CA 2729 was 93 on 10-07-15, this having been 110 on 08-29-15  Studies/Results:  No results found.  Medications: I have reviewed the patient's current medications. Refill decadron and EMLA. She has imodium in her purse, reminded to use for Perjeta diarrhea.  She requests that this office refill depakote. Per [facility] [phone]) 250 mg ER two daily last filled on 07-30-15 which was last of 1 year refills from prescription by Evlyn Courier at [facility] 07-30-14.   Patient reports that neurology told her this should be refilled by oncology (-?). We will follow up with [facility]      DISCUSSION Interval history as above. Reviewed side effects from Perjeta, chemo, neulasta. Encouraged her to push po fluids and use Carnation Instant if unable to tolerate regular diet  for several days after chemo.  Medications as above IVF orders added for today EMR schedule not correct since treatment delays, revised schedule to be given to patient with interpreter today.  Reminded patient that she will likely feel better again just given a little longer out from this chemo. Reminded her that she is clinically responding to treatment compared with situation at initial diagnosis of the metastatic disease.   Assessment/Plan: 1.Metastatic ER+, HER 2 + breast cancer extensively involving axial and appendicular skeleton: history of T1N1 right breast carcinoma age 50, ER/PR +, HER 2 + inNov 1999, no HER 2 therapy in 1999. Post nailing of impending fracture left femur by Dr [name] 06-26-15 and localradiation. FirstHerceptin, Perjeta and taxotere 08-09-15, with on pro neulasta; zometa every 4 weeks. Perjeta held cycle 3 due to diarrhea, resumed with cycle 4 50% dosing. She will have treatment again 10-27 if ANC >=1.5 and plt >=100k and if EF ok for Herceptin,  with on pro neulasta. Will keep Perjeta dose at 50%. I will see  her with IVF and zometa 11-6.  Consider change to AI + herceptin perjeta after 6 cycles taxotere.  2. Echocardiogram 08-05-15 with EF 60-65% , prior adriamycin < 300 mg/m2, no chest radiation. 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 Husband also deaf. All communication needs to be very clear 4.Seen by [facility] last 07-2014 for vertigo and HA, on depakote by neurology for HA. Note she does not tolerate lying on stomach due to vertigo.  5.post laparoscopic cholecystectomy 2004 6.right TRAM with original breast cancer 1999 7.premature surgical menopause, post BSO 202fr pelvic pain and ovarian cysts, likely was also beneficial from standpoint of the breast cancer.  8..claustrophobia: required full sedation for hip MRI. 9. Marked iron deficiency anemia: feraheme given 06-29-15 and 07-22-15. Chemo anemia now, not symptomatic to require transfusion 10. PAC placed by IR 08-06-15. Peripheral IV access limited to LUE due to breast cancer surgery/ radiation 11.Possible HA with zofran, tho we had previously thought this was not the case. Antiemetics unfortunately very limited by med intolerances, but did well cycle 3 with EMEND and zofran. Note she has refusedto use ativan SL, discussed.  Note on depakote by neurology for HA 12.diarrhea fromperjeta,C diff negative. Less diarrhea with decreased dose of perjeta, add imodium prn 13.has been given information on advance directives.  14..hypokalemia from severe Perjeta diarrhea previously. K 4.3 today. 15. flu vaccine given 10-14-15    All questions answered and patient seems to understand instructions and recommendations. Chemo, antibody, neulasta orders confirmed. Echocardiogram needed prior to next Herceptin. Time spent 30 min including >50% counseling and coordination of care.   LEvlyn Clines MD   11/03/2015, 12:51 PM

## 2015-11-05 ENCOUNTER — Telehealth: Payer: Self-pay | Admitting: Oncology

## 2015-11-05 NOTE — Telephone Encounter (Signed)
lvm to inform pt of echo appt at [facility] 10/26 at 11 am per LOS

## 2015-11-06 ENCOUNTER — Other Ambulatory Visit: Payer: Self-pay | Admitting: Oncology

## 2015-11-06 ENCOUNTER — Telehealth: Payer: Self-pay | Admitting: *Deleted

## 2015-11-06 DIAGNOSIS — C7951 Secondary malignant neoplasm of bone: Secondary | ICD-10-CM

## 2015-11-06 DIAGNOSIS — C799 Secondary malignant neoplasm of unspecified site: Secondary | ICD-10-CM

## 2015-11-06 DIAGNOSIS — C50919 Malignant neoplasm of unspecified site of unspecified female breast: Secondary | ICD-10-CM

## 2015-11-06 DIAGNOSIS — M7989 Other specified soft tissue disorders: Secondary | ICD-10-CM

## 2015-11-06 NOTE — Telephone Encounter (Signed)
Spoke with [name] via sign language interpreter. They did not have transportation to get to [place] today for 1530 appointment for doppler study of bilat. LE and plain x-rays of left leg. Appointment made for 1000 tomorrow morning at [facility] doppler.  Arrive in admitting for 0945. The echo is scheduled at 1100 10-26 also. She can go to radiology after Echo and have plain fims done of left leg. Requested husband repeat directions for appointments as noted above.  He stated directions correctly.

## 2015-11-06 NOTE — Telephone Encounter (Signed)
Received call from pt via interpreter re:  Pt experienced Left leg above knee swelling last night.  Stated has mild pain with weight bearing, and some problems with walking.   Denied redness, just very swollen.   Bowel and bladder function fine.  Pt wanted to know what Dr. Marko Plume would suggest - could it be infection from the rod in left leg, or what else. Pt's    Phone    [phone].

## 2015-11-07 ENCOUNTER — Ambulatory Visit (HOSPITAL_COMMUNITY)
Admission: RE | Admit: 2015-11-07 | Discharge: 2015-11-07 | Disposition: A | Discharge status: Home / Self Care | Payer: Medicaid Other | Source: Ambulatory Visit | Attending: Oncology | Admitting: Oncology

## 2015-11-07 DIAGNOSIS — I371 Nonrheumatic pulmonary valve insufficiency: Secondary | ICD-10-CM | POA: Insufficient documentation

## 2015-11-07 DIAGNOSIS — C799 Secondary malignant neoplasm of unspecified site: Secondary | ICD-10-CM | POA: Diagnosis not present

## 2015-11-07 DIAGNOSIS — Z79899 Other long term (current) drug therapy: Secondary | ICD-10-CM | POA: Insufficient documentation

## 2015-11-07 DIAGNOSIS — D571 Sickle-cell disease without crisis: Secondary | ICD-10-CM | POA: Diagnosis not present

## 2015-11-07 DIAGNOSIS — I071 Rheumatic tricuspid insufficiency: Secondary | ICD-10-CM | POA: Diagnosis not present

## 2015-11-07 DIAGNOSIS — C9000 Multiple myeloma not having achieved remission: Secondary | ICD-10-CM | POA: Insufficient documentation

## 2015-11-07 DIAGNOSIS — C50919 Malignant neoplasm of unspecified site of unspecified female breast: Secondary | ICD-10-CM | POA: Insufficient documentation

## 2015-11-07 DIAGNOSIS — N289 Disorder of kidney and ureter, unspecified: Secondary | ICD-10-CM | POA: Diagnosis not present

## 2015-11-07 DIAGNOSIS — I340 Nonrheumatic mitral (valve) insufficiency: Secondary | ICD-10-CM | POA: Insufficient documentation

## 2015-11-07 DIAGNOSIS — C7951 Secondary malignant neoplasm of bone: Secondary | ICD-10-CM | POA: Diagnosis not present

## 2015-11-07 DIAGNOSIS — F419 Anxiety disorder, unspecified: Secondary | ICD-10-CM | POA: Insufficient documentation

## 2015-11-07 DIAGNOSIS — M7989 Other specified soft tissue disorders: Secondary | ICD-10-CM | POA: Diagnosis present

## 2015-11-07 DIAGNOSIS — C50911 Malignant neoplasm of unspecified site of right female breast: Secondary | ICD-10-CM

## 2015-11-07 NOTE — Progress Notes (Signed)
*  Preliminary Results* Bilateral lower extremity venous duplex completed. Bilateral lower extremities are negative for deep vein thrombosis. There is no evidence of Baker's cyst bilaterally.  11/07/2015 10:35 AM Maudry Mayhew, BS, RVT, RDCS, RDMS

## 2015-11-07 NOTE — Progress Notes (Signed)
  Echocardiogram 2D Echocardiogram has been performed.  [name] 11/07/2015, 11:43 AM

## 2015-11-08 ENCOUNTER — Ambulatory Visit (HOSPITAL_BASED_OUTPATIENT_CLINIC_OR_DEPARTMENT_OTHER): Payer: Medicaid Other

## 2015-11-08 ENCOUNTER — Telehealth: Payer: Self-pay

## 2015-11-08 ENCOUNTER — Other Ambulatory Visit (HOSPITAL_BASED_OUTPATIENT_CLINIC_OR_DEPARTMENT_OTHER): Payer: Medicaid Other

## 2015-11-08 ENCOUNTER — Other Ambulatory Visit: Payer: Self-pay | Admitting: Oncology

## 2015-11-08 ENCOUNTER — Encounter: Payer: Self-pay | Admitting: Nurse Practitioner

## 2015-11-08 ENCOUNTER — Ambulatory Visit (HOSPITAL_BASED_OUTPATIENT_CLINIC_OR_DEPARTMENT_OTHER): Payer: Medicaid Other | Admitting: Nurse Practitioner

## 2015-11-08 ENCOUNTER — Ambulatory Visit: Payer: Medicaid Other

## 2015-11-08 VITALS — BP 106/64 | HR 79 | Temp 97.7°F | Resp 18

## 2015-11-08 DIAGNOSIS — M25552 Pain in left hip: Secondary | ICD-10-CM | POA: Insufficient documentation

## 2015-11-08 DIAGNOSIS — C7951 Secondary malignant neoplasm of bone: Secondary | ICD-10-CM | POA: Diagnosis not present

## 2015-11-08 DIAGNOSIS — C50919 Malignant neoplasm of unspecified site of unspecified female breast: Secondary | ICD-10-CM

## 2015-11-08 DIAGNOSIS — C50011 Malignant neoplasm of nipple and areola, right female breast: Secondary | ICD-10-CM

## 2015-11-08 DIAGNOSIS — C50911 Malignant neoplasm of unspecified site of right female breast: Secondary | ICD-10-CM

## 2015-11-08 DIAGNOSIS — M79652 Pain in left thigh: Secondary | ICD-10-CM

## 2015-11-08 DIAGNOSIS — Z95828 Presence of other vascular implants and grafts: Secondary | ICD-10-CM

## 2015-11-08 DIAGNOSIS — Z5111 Encounter for antineoplastic chemotherapy: Secondary | ICD-10-CM | POA: Diagnosis not present

## 2015-11-08 DIAGNOSIS — M79605 Pain in left leg: Secondary | ICD-10-CM

## 2015-11-08 DIAGNOSIS — Z5112 Encounter for antineoplastic immunotherapy: Secondary | ICD-10-CM | POA: Diagnosis not present

## 2015-11-08 LAB — CBC WITH DIFFERENTIAL/PLATELET
BASO%: 0 % (ref 0.0–2.0)
Basophils Absolute: 0 10*3/uL (ref 0.0–0.1)
EOS ABS: 0 10*3/uL (ref 0.0–0.5)
EOS%: 0 % (ref 0.0–7.0)
HEMATOCRIT: 28.7 % — AB (ref 34.8–46.6)
HGB: 9.5 g/dL — ABNORMAL LOW (ref 11.6–15.9)
LYMPH#: 0.4 10*3/uL — AB (ref 0.9–3.3)
LYMPH%: 9.3 % — AB (ref 14.0–49.7)
MCH: 31.1 pg (ref 25.1–34.0)
MCHC: 33.1 g/dL (ref 31.5–36.0)
MCV: 94.1 fL (ref 79.5–101.0)
MONO#: 0.1 10*3/uL (ref 0.1–0.9)
MONO%: 1.5 % (ref 0.0–14.0)
NEUT%: 89.2 % — AB (ref 38.4–76.8)
NEUTROS ABS: 4.1 10*3/uL (ref 1.5–6.5)
PLATELETS: 204 10*3/uL (ref 145–400)
RBC: 3.05 10*6/uL — AB (ref 3.70–5.45)
RDW: 15.6 % — ABNORMAL HIGH (ref 11.2–14.5)
WBC: 4.6 10*3/uL (ref 3.9–10.3)

## 2015-11-08 LAB — COMPREHENSIVE METABOLIC PANEL
ALK PHOS: 71 U/L (ref 40–150)
ALT: 12 U/L (ref 0–55)
ANION GAP: 12 meq/L — AB (ref 3–11)
AST: 7 U/L (ref 5–34)
Albumin: 3.2 g/dL — ABNORMAL LOW (ref 3.5–5.0)
BILIRUBIN TOTAL: 0.56 mg/dL (ref 0.20–1.20)
BUN: 7.6 mg/dL (ref 7.0–26.0)
CALCIUM: 8.9 mg/dL (ref 8.4–10.4)
CO2: 23 meq/L (ref 22–29)
CREATININE: 0.7 mg/dL (ref 0.6–1.1)
Chloride: 108 mEq/L (ref 98–109)
Glucose: 137 mg/dl (ref 70–140)
Potassium: 3.4 mEq/L — ABNORMAL LOW (ref 3.5–5.1)
Sodium: 143 mEq/L (ref 136–145)
TOTAL PROTEIN: 5.9 g/dL — AB (ref 6.4–8.3)

## 2015-11-08 MED ORDER — DOCETAXEL CHEMO INJECTION 160 MG/16ML
75.0000 mg/m2 | Freq: Once | INTRAVENOUS | Status: AC
  Administered 2015-11-08: 130 mg via INTRAVENOUS
  Filled 2015-11-08: qty 13

## 2015-11-08 MED ORDER — SODIUM CHLORIDE 0.9 % IV SOLN
210.0000 mg | Freq: Once | INTRAVENOUS | Status: AC
  Administered 2015-11-08: 210 mg via INTRAVENOUS
  Filled 2015-11-08: qty 7

## 2015-11-08 MED ORDER — LORATADINE 10 MG PO TABS
10.0000 mg | ORAL_TABLET | Freq: Every day | ORAL | Status: DC
  Administered 2015-11-08: 10 mg via ORAL
  Filled 2015-11-08: qty 1

## 2015-11-08 MED ORDER — ONDANSETRON HCL 8 MG PO TABS
ORAL_TABLET | ORAL | Status: AC
  Filled 2015-11-08: qty 1

## 2015-11-08 MED ORDER — FOSAPREPITANT DIMEGLUMINE INJECTION 150 MG
Freq: Once | INTRAVENOUS | Status: AC
  Administered 2015-11-08: 15:00:00 via INTRAVENOUS
  Filled 2015-11-08: qty 5

## 2015-11-08 MED ORDER — PEGFILGRASTIM 6 MG/0.6ML ~~LOC~~ PSKT
6.0000 mg | PREFILLED_SYRINGE | Freq: Once | SUBCUTANEOUS | Status: AC
  Administered 2015-11-08: 6 mg via SUBCUTANEOUS
  Filled 2015-11-08: qty 0.6

## 2015-11-08 MED ORDER — LORAZEPAM 2 MG/ML IJ SOLN
INTRAMUSCULAR | Status: AC
  Filled 2015-11-08: qty 1

## 2015-11-08 MED ORDER — HYDROCODONE-ACETAMINOPHEN 7.5-325 MG PO TABS: ORAL_TABLET | ORAL | 0 refills | Status: DC

## 2015-11-08 MED ORDER — SODIUM CHLORIDE 0.9 % IV SOLN
Freq: Once | INTRAVENOUS | Status: AC
  Administered 2015-11-08: 15:00:00 via INTRAVENOUS

## 2015-11-08 MED ORDER — HYDROMORPHONE HCL 4 MG/ML IJ SOLN
INTRAMUSCULAR | Status: AC
  Filled 2015-11-08: qty 1

## 2015-11-08 MED ORDER — TRASTUZUMAB CHEMO 150 MG IV SOLR
6.0000 mg/kg | Freq: Once | INTRAVENOUS | Status: AC
  Administered 2015-11-08: 357 mg via INTRAVENOUS
  Filled 2015-11-08: qty 17

## 2015-11-08 MED ORDER — SODIUM CHLORIDE 0.9 % IJ SOLN
10.0000 mL | INTRAMUSCULAR | Status: DC | PRN
  Administered 2015-11-08: 10 mL via INTRAVENOUS
  Filled 2015-11-08: qty 10

## 2015-11-08 MED ORDER — HEPARIN SOD (PORK) LOCK FLUSH 100 UNIT/ML IV SOLN
500.0000 [IU] | Freq: Once | INTRAVENOUS | Status: AC | PRN
  Administered 2015-11-08: 500 [IU]
  Filled 2015-11-08: qty 5

## 2015-11-08 MED ORDER — HYDROMORPHONE HCL 1 MG/ML IJ SOLN
1.0000 mg | Freq: Once | INTRAMUSCULAR | Status: AC
Start: 2015-11-08 — End: 2015-11-08
  Administered 2015-11-08: 1 mg via INTRAVENOUS
  Filled 2015-11-08: qty 1

## 2015-11-08 MED ORDER — ONDANSETRON HCL 8 MG PO TABS
8.0000 mg | ORAL_TABLET | Freq: Once | ORAL | Status: AC
Start: 2015-11-08 — End: 2015-11-08
  Administered 2015-11-08: 8 mg via ORAL

## 2015-11-08 MED ORDER — ACETAMINOPHEN 325 MG PO TABS
650.0000 mg | ORAL_TABLET | Freq: Once | ORAL | Status: AC
  Administered 2015-11-08: 650 mg via ORAL

## 2015-11-08 MED ORDER — SODIUM CHLORIDE 0.9% FLUSH
10.0000 mL | INTRAVENOUS | Status: DC | PRN
  Administered 2015-11-08: 10 mL
  Filled 2015-11-08: qty 10

## 2015-11-08 MED ORDER — LORAZEPAM 2 MG/ML IJ SOLN
1.0000 mg | Freq: Once | INTRAMUSCULAR | Status: AC | PRN
  Administered 2015-11-08: 1 mg via INTRAVENOUS

## 2015-11-08 MED ORDER — ACETAMINOPHEN 325 MG PO TABS
ORAL_TABLET | ORAL | Status: AC
  Filled 2015-11-08: qty 2

## 2015-11-08 NOTE — Telephone Encounter (Signed)
  [name] Preferred Name:  None Female, 50 y.o., January 07, 1966 Last Weight:  124 lb 12.8 oz (56.6 kg) Weight:  124 lb 12.8 oz (56.6 kg) Phone:  [phone] PCP:  [name], MD Language:  Sign Language Need Interpreter:  Yes Allergies:  Chlorhexidine, Promethazine Hcl, Diphenhydramine Hcl Health Maintenance Due?:  Health Maintenance Active FYIs:  General Primary Ins.:  MEDICAID [place] MRN:  [id number] MyChart:  Active Next Appt:  11/11/2015 Message  Received: Yesterday  Message Contents  Debra Levan, MD  Debra Merl, RN        Labs seen and need follow up; No DVT by doppler today and no acute bony problems by xray. If still having pain and redness on 10-27, please have symptom management see on 10-27, or discuss with me. Be sure no fever, that she did not injure leg or knee. OK to try ice, OK to take pain meds if she has available  thanks     DG FEMUR MIN 2 VIEWS LEFT  Order: [id number]  Status:  Final result Visible to patient:  No (Not Released) Dx:  Bony metastasis ([place]); Left leg swell...  Notes Recorded by Debra Levan, MD on 11/07/2015 at 3:09 PM EDT Labs seen and need follow up; No DVT by doppler today and no acute bony problems by xray. If still having pain and redness on 10-27, please have symptom management see on 10-27, or discuss with me. Be sure no fever, that she did not injure leg or knee. OK to try ice, OK to take pain meds if she has available  thanks  Details   Reading Physician Reading Date Result Priority  Fabio Neighbors., MD 11/07/2015   Narrative    CLINICAL DATA: Breast cancer.  EXAM: LEFT FEMUR 2 VIEWS  COMPARISON: 06/26/2015.  FINDINGS: ORIF left proximal femur fracture. Hardware intact. Bony structures are stable. Changes consistent with metastatic disease again noted the proximal left femur.  IMPRESSION: ORIF proximal left femur with good anatomic alignment. Hardware intact. Findings  consistent with metastatic disease in the proximal left femur again noted without significant change.   Electronically Signed By: Marcello Moores Register On: 11/07/2015 14:22       Last Resulted: 11/07/15 14:22

## 2015-11-08 NOTE — Assessment & Plan Note (Signed)
Patient states that she began experiencing significant pain and edema to her left thigh region on Wednesday.  She states that she typically never takes any pain medication whatsoever; but started taking the hydrocodone 7.5 mg tablets on Wednesday due to the discomfort.  She states that she was taken to the hydrocodone tablets every 6 hours.  She states that she has now run out off the pain medication since last night.  Patient denies any known injury or trauma to the leg.  She has known bone metastasis; and has had left femur rodding in the past.  Patient denies any other new symptoms whatsoever.  She denies any recent fevers or chills.  Prior to this provider seen.  The patient-patient underwent a Doppler ultrasound of the left lower extremity which was negative for DVT.  Also, patient had a plain film x-ray of her left femur, which showed no acute findings.  Exam today revealed mild tenderness to the left hip region and left thigh with palpation; and some mild edema of the left thigh region as compared to the right.  There was no erythema, warmth, obvious infection, or red streaks to the site.  Reviewed all findings with Dr. Marko Plume; and she recommended plain film pelvis/hips to further evaluate.  Also, patient will be arranged to see her orthopedist, Dr. [name] on Monday, 11/11/2015 for further evaluation.  Patient was given Dilaudid 1 mg IV while at the [facility] which helped the patient to feel much more comfortable.  Also, patient was given a refill of her hydrocodone as well.  Action was advised to use her walker at home over the weekend and to minimize weightbearing to the left lower extremity if at all possible.  She was also encouraged go directly to the emergency department for any worsening symptoms whatsoever.

## 2015-11-08 NOTE — Progress Notes (Signed)
SYMPTOM MANAGEMENT CLINIC    Chief Complaint: Left thigh pain  HPI:  [name] 50 y.o. female diagnosed with  breast cancer with bone metastasis.  Currently on Taxotere/Herceptin/Perjeta chemotherapy regimen.   No history exists.    Review of Systems  Musculoskeletal:       Left thigh pain  All other systems reviewed and are negative.   Past Medical History:  Diagnosis Date  . Abnormal Pap smear   . Anxiety   . Breast cancer ([place])   . Cancer ([place]) 1999   breast-s/p mastectomy, chemo, rad  . CIN I (cervical intraepithelial neoplasia I) 2003   by colpo  . Congenital deafness   . Deaf   . Depression   . Radiation 07/12/15-07/26/15   left femur 30 Gy  . S/P bilateral oophorectomy     Past Surgical History:  Procedure Laterality Date  . ABDOMINAL HYSTERECTOMY    . INTRAMEDULLARY (IM) NAIL INTERTROCHANTERIC Left 06/26/2015   Procedure: LEFT BIOMET LONG AFFIXS NAIL;  Surgeon: [name], MD;  Location: [facility];  Service: Orthopedics;  Laterality: Left;  . IR GENERIC HISTORICAL  08/06/2015   IR US GUIDE VASC ACCESS LEFT 08/06/2015 Aletta Edouard, MD [facility]  . IR GENERIC HISTORICAL  08/06/2015   IR FLUORO GUIDE CV LINE LEFT 08/06/2015 Aletta Edouard, MD [facility]  . MASTECTOMY     right breast  . MASTECTOMY    . OVARIAN CYST REMOVAL    . RADIOLOGY WITH ANESTHESIA Left 06/25/2015   Procedure: MRI OF LEFT HIP WITH OR WITHOUT CONTRAST;  Surgeon: Medication Radiologist, MD;  Location: [facility];  Service: Radiology;  Laterality: Left;  DR. MCINTYRE/MRI  . TUBAL LIGATION      has Hearing loss; CERVICAL DYSPLASIA; MENSTRUATION, PAINFUL; DE QUERVAIN'S TENOSYNOVITIS, LEFT WRIST; DIZZINESS, CHRONIC; INSOMNIA; EMESIS; ABDOMINAL PAIN, GENERALIZED; BACK STRAIN, LUMBAR; Pharyngitis, acute; Dizziness; Migraine without aura, with intractable migraine, so stated, without mention of status migrainosus; Hx of breast cancer; Colon cancer screening; RLQ abdominal pain; Dysuria;  Benign paroxysmal positional vertigo; Migraine; Congenital deafness; Noncompliance with therapeutic plan; Hx of BSO (bilateral salpingo-oophorectomy); Premature surgical menopause; Genetic testing; Bony metastasis ([place]); Breast cancer ([place]); Metastatic bone cancer ([place]); Depression; Generalized anxiety disorder; Esophageal reflux; Left hip pain; Constipation due to pain medication; Metastasis from breast cancer (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    is allergic to chlorhexidine; promethazine hcl; and diphenhydramine hcl.    Medication List       Accurate as of 11/08/15  6:32 PM. Always use your most recent med list.          dexamethasone 4 MG tablet Commonly known as:  DECADRON Take 2 tablets with food twice a day for 3 days. Begining the day prior to Taxotere chemotherapy.   divalproex 250 MG DR tablet Commonly known as:  DEPAKOTE Take 2 tablets (500 mg total) by mouth at bedtime.   HYDROcodone-acetaminophen 7.5-325 MG tablet Commonly known as:  NORCO Take 1-2 tabs PO Q 6 hours PRN pain.   ibuprofen 200 MG tablet Commonly known as:  ADVIL,MOTRIN Take 200 mg by mouth every 6 (  six) hours as needed for mild pain.   lidocaine-prilocaine cream Commonly known as:  EMLA Apply 1 application topically as needed. Apply to Porta-Cath 1-2 hours prior to access as directed.   loperamide 2 MG tablet Commonly known as:  IMODIUM A-D Take 1-2 tablets after each loose stool. UP TO 6 IN  24 HRS.     LORazepam 1 MG tablet Commonly known as:  ATIVAN Place one tablet under the tongue or swallow every 4-6 hours as needed for nausea. (THIS IS THE SAME STRENGTH AS 2 OF YOUR 0.5 MG TABLETS)   ondansetron 8 MG tablet Commonly known as:  ZOFRAN Take 1 tablet (8 mg total) by mouth every 8 (eight) hours as needed for nausea or vomiting.   potassium chloride 10 MEQ tablet Commonly known as:  K-DUR Take 2 tablets (20 mEq total) by mouth daily.        PHYSICAL EXAMINATION  Oncology Vitals 11/08/2015 10/30/2015  Height - -  Weight - -  Weight (lbs) - -  BMI (kg/m2) - -  Temp 97.7 -  Pulse 79 79  Resp 18 20  SpO2 100 100  BSA (m2) - -   BP Readings from Last 2 Encounters:  11/08/15 106/64  10/30/15 94/62    Physical Exam  Constitutional: She is oriented to person, place, and time and well-developed, well-nourished, and in no distress.  HENT:  Head: Normocephalic and atraumatic.  Eyes: Conjunctivae and EOM are normal. Pupils are equal, round, and reactive to light.  Neck: Normal range of motion.  Pulmonary/Chest: Effort normal. No respiratory distress.  Musculoskeletal: Normal range of motion. She exhibits edema and tenderness. She exhibits no deformity.  Tenderness to the left hip region with palpation; and some mild edema to the entire left thigh.  There was no erythema, warmth, or red streaks.  No obvious signs of infection.  Patient was observed with full range of motion.  She did appear to have some discomfort with any movement of her left leg or hip.  Neurological: She is alert and oriented to person, place, and time.  Skin: Skin is warm and dry.  Psychiatric: Affect normal.  Nursing note and vitals reviewed.   LABORATORY DATA:. Appointment on 11/08/2015  Component Date Value Ref Range Status  . WBC 11/08/2015 4.6  3.9 - 10.3 10e3/uL Final  . NEUT# 11/08/2015 4.1  1.5 - 6.5 10e3/uL Final  . HGB 11/08/2015 9.5* 11.6 - 15.9 g/dL Final  . HCT 11/08/2015 28.7* 34.8 -  46.6 % Final  . Platelets 11/08/2015 204  145 - 400 10e3/uL Final  . MCV 11/08/2015 94.1  79.5 - 101.0 fL Final  . MCH 11/08/2015 31.1  25.1 - 34.0 pg Final  . MCHC 11/08/2015 33.1  31.5 - 36.0 g/dL Final  . RBC 11/08/2015 3.05* 3.70 - 5.45 10e6/uL Final  . RDW 11/08/2015 15.6* 11.2 - 14.5 % Final  . lymph# 11/08/2015 0.4* 0.9 - 3.3 10e3/uL Final  . MONO# 11/08/2015 0.1  0.1 - 0.9 10e3/uL Final  . Eosinophils Absolute 11/08/2015 0.0  0.0 - 0.5 10e3/uL Final  . Basophils Absolute 11/08/2015 0.0  0.0 - 0.1 10e3/uL Final  . NEUT% 11/08/2015 89.2* 38.4 - 76.8 % Final  . LYMPH% 11/08/2015 9.3* 14.0 - 49.7 % Final  . MONO% 11/08/2015 1.5  0.0 - 14.0 % Final  . EOS% 11/08/2015 0.0  0.0 - 7.0 % Final  . BASO% 11/08/2015 0.0  0.0 - 2.0 % Final  . Sodium 11/08/2015 143    136 - 145 mEq/L Final  . Potassium 11/08/2015 3.4* 3.5 - 5.1 mEq/L Final  . Chloride 11/08/2015 108  98 - 109 mEq/L Final  . CO2 11/08/2015 23  22 - 29 mEq/L Final  . Glucose 11/08/2015 137  70 - 140 mg/dl Final  . BUN 11/08/2015 7.6  7.0 - 26.0 mg/dL Final  . Creatinine 11/08/2015 0.7  0.6 - 1.1 mg/dL Final  . Total Bilirubin 11/08/2015 0.56  0.20 - 1.20 mg/dL Final  . Alkaline Phosphatase 11/08/2015 71  40 - 150 U/L Final  . AST 11/08/2015 7  5 - 34 U/L Final  . ALT 11/08/2015 12  0 - 55 U/L Final  . Total Protein 11/08/2015 5.9* 6.4 - 8.3 g/dL Final  . Albumin 11/08/2015 3.2* 3.5 - 5.0 g/dL Final  . Calcium 11/08/2015 8.9  8.4 - 10.4 mg/dL Final  . Anion Gap 11/08/2015 12* 3 - 11 mEq/L Final  . EGFR 11/08/2015 >90  >90 ml/min/1.73 m2 Final    RADIOGRAPHIC STUDIES: Dg Femur Min 2 Views Left  Result Date: 11/07/2015 CLINICAL DATA:  Breast cancer. EXAM: LEFT FEMUR 2 VIEWS COMPARISON:  06/26/2015. FINDINGS: ORIF left proximal femur fracture. Hardware intact. Bony structures are stable. Changes consistent with metastatic disease again noted the proximal left femur. IMPRESSION: ORIF proximal left femur with good anatomic  alignment. Hardware intact. Findings consistent with metastatic disease in the proximal left femur again noted without significant change. Electronically Signed   By: Marcello Moores  Register   On: 11/07/2015 14:22    ASSESSMENT/PLAN:    Leg pain, left Patient states that she began experiencing significant pain and edema to her left thigh region on Wednesday.  She states that she typically never takes any pain medication whatsoever; but started taking the hydrocodone 7.5 mg tablets on Wednesday due to the discomfort.  She states that she was taken to the hydrocodone tablets every 6 hours.  She states that she has now run out off the pain medication since last night.  Patient denies any known injury or trauma to the leg.  She has known bone metastasis; and has had left femur rodding in the past.  Patient denies any other new symptoms whatsoever.  She denies any recent fevers or chills.  Prior to this provider seen.  The patient-patient underwent a Doppler ultrasound of the left lower extremity which was negative for DVT.  Also, patient had a plain film x-ray of her left femur, which showed no acute findings.  Exam today revealed mild tenderness to the left hip region and left thigh with palpation; and some mild edema of the left thigh region as compared to the right.  There was no erythema, warmth, obvious infection, or red streaks to the site.  Reviewed all findings with Dr. Marko Plume; and she recommended plain film pelvis/hips to further evaluate.  Also, patient will be arranged to see her orthopedist, Dr. [name] on Monday, 11/11/2015 for further evaluation.  Patient was given Dilaudid 1 mg IV while at the [facility] which helped the patient to feel much more comfortable.  Also, patient was given a refill of her hydrocodone as well.  Action was advised to use her walker at home over the weekend and to minimize weightbearing to the left lower extremity if at all possible.  She was also encouraged go  directly to the emergency department for any worsening symptoms whatsoever.  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  Patient had an interpreter for the death during the entire exam.  See further notes for details of today's visit.  Patient is scheduled to see Dr. [name] and to receive her next infusion on 11/11/2015.  She is also scheduled for labs, flush, visit, and her next cycle of chemotherapy on 11/18/2015.   Patient stated understanding of all instructions; and was in agreement with this plan of care. The patient knows to call the clinic with any problems, questions or concerns.   Total time spent with patient was 25 minutes;  with greater than 75 percent of that time spent in face to face counseling regarding patient's symptoms,  and coordination of care and follow up.  Disclaimer:This dictation was prepared with Dragon/digital dictation along with [employer]. Any transcriptional errors that result from this process are unintentional.  Drue Second, NP 11/08/2015

## 2015-11-08 NOTE — Patient Instructions (Signed)
[facility] Discharge Instructions for Patients Receiving Chemotherapy  Today you received the following chemotherapy agents:  Taxotere, Herceptin, Perjeta  To help prevent nausea and vomiting after your treatment, we encourage you to take your nausea medication as prescribed.   If you develop nausea and vomiting that is not controlled by your nausea medication, call the clinic.   BELOW ARE SYMPTOMS THAT SHOULD BE REPORTED IMMEDIATELY:  *FEVER GREATER THAN 100.5 F  *CHILLS WITH OR WITHOUT FEVER  NAUSEA AND VOMITING THAT IS NOT CONTROLLED WITH YOUR NAUSEA MEDICATION  *UNUSUAL SHORTNESS OF BREATH  *UNUSUAL BRUISING OR BLEEDING  TENDERNESS IN MOUTH AND THROAT WITH OR WITHOUT PRESENCE OF ULCERS  *URINARY PROBLEMS  *BOWEL PROBLEMS  UNUSUAL RASH Items with * indicate a potential emergency and should be followed up as soon as possible.  Feel free to call the clinic you have any questions or concerns. The clinic phone number is [phone].  Please show the [facility] at check-in to the Emergency Department and triage nurse.

## 2015-11-08 NOTE — Assessment & Plan Note (Signed)
Patient presented to the [facility] today to receive cycle 5 of her Taxotere/Herceptin/Perjeta chemotherapy regimen.  Patient had an interpreter for the death during the entire exam.  See further notes for details of today's visit.  Patient is scheduled to see Dr. [name] and to receive her next infusion on 11/11/2015.  She is also scheduled for labs, flush, visit, and her next cycle of chemotherapy on 11/18/2015.

## 2015-11-08 NOTE — Progress Notes (Signed)
Pt given an additional 51mls of IVF per her request and OK from Dr. Marko Plume.  The patient states she was not aware that she has an appointment to come in for IVF on Mon 10/30, but that she prefers to receive the extra fluids during treatment anyway because it lessens the amount of nausea she has the evening after chemo.    Debra Lesser, NP called to see patient in infusion room d/t to c/o left femur pain and swelling.  Upon inspection leg is edematous but no erythema.  1mg  IV dilaudid given per Debra Lesser, NP and pt also to go to radiology after treatment for xrays of leg.  Pt's treatment will not be complete until 1745 at which point she will need to go to ED to check in for her xray.  Pt's interpreter states that patient does not want to do that.  This nurse advised pt that if her pain has gotten worse since her xrays yesterday it would be important the she go have repeat xrays.  Pt states pain is not as bad as earlier in the week and she is OK with waiting to see the orthopedic surgeon on Monday 10/30

## 2015-11-08 NOTE — Telephone Encounter (Signed)
Gave Ms Parthasarathy the appointment for Dr. [name] on 11-11-15 at 0930.  Arrive at 0900, in written form and reviewed with interpreter. Dr. [name] is working her in between surgeries, so she may wait a little bit. His office will call the Interpreter service to have a person there to interpret for her.  Told the appointment cleark that Ms Duvernay prefers a female interpreter.

## 2015-11-11 ENCOUNTER — Ambulatory Visit (INDEPENDENT_AMBULATORY_CARE_PROVIDER_SITE_OTHER): Payer: Medicaid Other | Admitting: Orthopaedic Surgery

## 2015-11-11 ENCOUNTER — Ambulatory Visit: Payer: Medicaid Other

## 2015-11-17 ENCOUNTER — Other Ambulatory Visit: Payer: Self-pay | Admitting: Oncology

## 2015-11-17 DIAGNOSIS — C50911 Malignant neoplasm of unspecified site of right female breast: Secondary | ICD-10-CM

## 2015-11-17 DIAGNOSIS — C7951 Secondary malignant neoplasm of bone: Principal | ICD-10-CM

## 2015-11-18 ENCOUNTER — Ambulatory Visit (HOSPITAL_BASED_OUTPATIENT_CLINIC_OR_DEPARTMENT_OTHER): Payer: Medicaid Other

## 2015-11-18 ENCOUNTER — Encounter: Payer: Self-pay | Admitting: Oncology

## 2015-11-18 ENCOUNTER — Encounter (INDEPENDENT_AMBULATORY_CARE_PROVIDER_SITE_OTHER): Payer: Self-pay | Admitting: Orthopaedic Surgery

## 2015-11-18 ENCOUNTER — Other Ambulatory Visit (HOSPITAL_BASED_OUTPATIENT_CLINIC_OR_DEPARTMENT_OTHER): Payer: Medicaid Other

## 2015-11-18 ENCOUNTER — Ambulatory Visit (HOSPITAL_BASED_OUTPATIENT_CLINIC_OR_DEPARTMENT_OTHER): Payer: Medicaid Other | Admitting: Oncology

## 2015-11-18 ENCOUNTER — Ambulatory Visit: Payer: Medicaid Other

## 2015-11-18 ENCOUNTER — Ambulatory Visit (INDEPENDENT_AMBULATORY_CARE_PROVIDER_SITE_OTHER): Payer: Medicaid Other | Admitting: Orthopaedic Surgery

## 2015-11-18 VITALS — BP 102/52 | HR 85 | Ht 67.0 in | Wt 121.0 lb

## 2015-11-18 VITALS — BP 115/61 | HR 88 | Temp 97.5°F | Resp 18 | Ht 67.0 in | Wt 121.1 lb

## 2015-11-18 DIAGNOSIS — M7989 Other specified soft tissue disorders: Secondary | ICD-10-CM

## 2015-11-18 DIAGNOSIS — C50911 Malignant neoplasm of unspecified site of right female breast: Secondary | ICD-10-CM

## 2015-11-18 DIAGNOSIS — R112 Nausea with vomiting, unspecified: Secondary | ICD-10-CM

## 2015-11-18 DIAGNOSIS — T451X5A Adverse effect of antineoplastic and immunosuppressive drugs, initial encounter: Secondary | ICD-10-CM

## 2015-11-18 DIAGNOSIS — R197 Diarrhea, unspecified: Secondary | ICD-10-CM

## 2015-11-18 DIAGNOSIS — C7951 Secondary malignant neoplasm of bone: Secondary | ICD-10-CM | POA: Diagnosis not present

## 2015-11-18 DIAGNOSIS — Z95828 Presence of other vascular implants and grafts: Secondary | ICD-10-CM

## 2015-11-18 DIAGNOSIS — E876 Hypokalemia: Secondary | ICD-10-CM

## 2015-11-18 DIAGNOSIS — D6481 Anemia due to antineoplastic chemotherapy: Secondary | ICD-10-CM | POA: Diagnosis not present

## 2015-11-18 DIAGNOSIS — M25552 Pain in left hip: Secondary | ICD-10-CM

## 2015-11-18 DIAGNOSIS — C50919 Malignant neoplasm of unspecified site of unspecified female breast: Secondary | ICD-10-CM

## 2015-11-18 DIAGNOSIS — C419 Malignant neoplasm of bone and articular cartilage, unspecified: Secondary | ICD-10-CM

## 2015-11-18 DIAGNOSIS — C799 Secondary malignant neoplasm of unspecified site: Secondary | ICD-10-CM

## 2015-11-18 DIAGNOSIS — H905 Unspecified sensorineural hearing loss: Secondary | ICD-10-CM

## 2015-11-18 DIAGNOSIS — M79605 Pain in left leg: Secondary | ICD-10-CM | POA: Diagnosis not present

## 2015-11-18 LAB — COMPREHENSIVE METABOLIC PANEL
ALT: 10 U/L (ref 0–55)
AST: 10 U/L (ref 5–34)
Albumin: 3.1 g/dL — ABNORMAL LOW (ref 3.5–5.0)
Alkaline Phosphatase: 112 U/L (ref 40–150)
Anion Gap: 9 mEq/L (ref 3–11)
BUN: 5.3 mg/dL — AB (ref 7.0–26.0)
CALCIUM: 8.5 mg/dL (ref 8.4–10.4)
CHLORIDE: 106 meq/L (ref 98–109)
CO2: 27 mEq/L (ref 22–29)
Creatinine: 0.7 mg/dL (ref 0.6–1.1)
EGFR: >90 mL/min/{1.73_m2} (ref >90)
Glucose: 96 mg/dl (ref 70–140)
POTASSIUM: 3.6 meq/L (ref 3.5–5.1)
Sodium: 141 mEq/L (ref 136–145)
Total Bilirubin: 0.66 mg/dL (ref 0.20–1.20)
Total Protein: 5.9 g/dL — ABNORMAL LOW (ref 6.4–8.3)

## 2015-11-18 LAB — CBC WITH DIFFERENTIAL/PLATELET
BASO%: 0.6 % (ref 0.0–2.0)
BASOS ABS: 0.1 10*3/uL (ref 0.0–0.1)
EOS%: 0.1 % (ref 0.0–7.0)
Eosinophils Absolute: 0 10*3/uL (ref 0.0–0.5)
HEMATOCRIT: 30.7 % — AB (ref 34.8–46.6)
HGB: 10.2 g/dL — ABNORMAL LOW (ref 11.6–15.9)
LYMPH#: 1.4 10*3/uL (ref 0.9–3.3)
LYMPH%: 10.2 % — AB (ref 14.0–49.7)
MCH: 30.8 pg (ref 25.1–34.0)
MCHC: 33.3 g/dL (ref 31.5–36.0)
MCV: 92.7 fL (ref 79.5–101.0)
MONO#: 0.7 10*3/uL (ref 0.1–0.9)
MONO%: 4.6 % (ref 0.0–14.0)
NEUT#: 11.9 10*3/uL — ABNORMAL HIGH (ref 1.5–6.5)
NEUT%: 84.5 % — AB (ref 38.4–76.8)
Platelets: 231 10*3/uL (ref 145–400)
RBC: 3.31 10*6/uL — ABNORMAL LOW (ref 3.70–5.45)
RDW: 15.5 % — ABNORMAL HIGH (ref 11.2–14.5)
WBC: 14.1 10*3/uL — ABNORMAL HIGH (ref 3.9–10.3)

## 2015-11-18 LAB — MAGNESIUM: MAGNESIUM: 1.8 mg/dL (ref 1.5–2.5)

## 2015-11-18 MED ORDER — ONDANSETRON HCL 8 MG PO TABS
8.0000 mg | ORAL_TABLET | Freq: Once | ORAL | Status: AC
  Administered 2015-11-18: 8 mg via ORAL

## 2015-11-18 MED ORDER — ONDANSETRON HCL 8 MG PO TABS
ORAL_TABLET | ORAL | Status: AC
  Filled 2015-11-18: qty 1

## 2015-11-18 MED ORDER — SODIUM CHLORIDE 0.9% FLUSH
3.0000 mL | Freq: Once | INTRAVENOUS | Status: AC | PRN
  Administered 2015-11-18: 10 mL via INTRAVENOUS
  Filled 2015-11-18: qty 10

## 2015-11-18 MED ORDER — SODIUM CHLORIDE 0.9 % IV SOLN
Freq: Once | INTRAVENOUS | Status: AC | PRN
  Administered 2015-11-18: 12:00:00 via INTRAVENOUS

## 2015-11-18 MED ORDER — HEPARIN SOD (PORK) LOCK FLUSH 100 UNIT/ML IV SOLN
250.0000 [IU] | Freq: Once | INTRAVENOUS | Status: AC | PRN
  Administered 2015-11-18: 500 [IU]
  Filled 2015-11-18: qty 5

## 2015-11-18 MED ORDER — SODIUM CHLORIDE 0.9 % IV SOLN: Freq: Once | INTRAVENOUS | Status: DC

## 2015-11-18 MED ORDER — ONDANSETRON HCL 4 MG/2ML IJ SOLN
INTRAMUSCULAR | Status: AC
  Filled 2015-11-18: qty 4

## 2015-11-18 MED ORDER — ONDANSETRON HCL 4 MG/2ML IJ SOLN
8.0000 mg | Freq: Once | INTRAMUSCULAR | Status: AC
  Administered 2015-11-18: 8 mg via INTRAVENOUS

## 2015-11-18 MED ORDER — ZOLEDRONIC ACID 4 MG/100ML IV SOLN
4.0000 mg | Freq: Once | INTRAVENOUS | Status: AC
  Administered 2015-11-18: 4 mg via INTRAVENOUS
  Filled 2015-11-18: qty 100

## 2015-11-18 MED ORDER — SODIUM CHLORIDE 0.9 % IJ SOLN
10.0000 mL | INTRAMUSCULAR | Status: DC | PRN
  Administered 2015-11-18: 10 mL via INTRAVENOUS
  Filled 2015-11-18: qty 10

## 2015-11-18 NOTE — Progress Notes (Signed)
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 She continues taxotere, herceptin, perjeta, with cycle 5 given 11-08-15, with on pro neulasta. Zometa also given today (11-18-15).  Patient was seen in ED on 10-30-15 with nausea, with CT AP no involvement obvious other than bones (bone involvement difficult to tell on CTs). She had echocardiogram on 11-07-15 for herceptin.  Two days prior to most recent chemo, on 11-06-15, patient called with increased pain and swelling LLE. She denied trauma. LE venous doppler was negative on 11-07-15 and plain Xray LLE did not show obvious problems. She was seen in symptom management clinic at [facility] on 11-08-15.  Work in appointment was obtained with Dr [name] for 11-11-15, this communicated directly to patient and sign language interpreter by RN on 11-08-15 with written instructions also; sign language interpreter arrived for that appointment as scheduled, however patient [place]. Patient tells me that she needs phone call reminders for appointments such as with orthopedics, however she was fully informed about that appointment.   Patient continues to have pain and some swelling in left thigh. The discomfort is constant, not worsening, no pain back or pelvis, worse with ambulation. She has had no fever, no rash, denies trauma. No other swelling. She was more comfortable after IV dilaudid at some point, still have hydrocodone available at home. She complains of poor appetite since chemo, some vomiting including last pm, is able to drink milk, no longer likes [employer]. Bowels are moving several times  daily, improve with imodium, not watery. Perirectal area is irritated from BMs, has done sitz baths with assistance of husband in and out of tub. She had some aching with neulasta. She denies SOB, cough, abdominal pain, any bleeding, increased peripheral neuropathy, problems with PAC.  Remainder of 10 point Review of Systems negat      PAC by IR 08-06-15 Feraheme 06-29-15 and 07-22-15 Genetics testing negative by Breast Ovarian panel 03-2015 Left [facility] 03-15-15 heterogeneously dense tissue otherwise negative. CA 2729 on 06-26-15: 72.5 Flu vaccine 10-14-15  ONCOLOGIC HISTORY Patient had 1.4 cm poorly differentiated right breast carcinoma diagnosed in Nov 1999 at age 26, 1/7 axillary nodes involved, ER/PR and HER 2 positive. She had mastectomy with the 7 axillary node evaluation, 4 cycles of adriamycin/ cytoxan followed by taxotere, then five years of tamoxifen thru June 2005 (no herceptin in 1999). She did not have local radiation. She had right TRAM reconstruction. She had bilateral salpingoophorectomy in May 2005. She was briefly on aromatase inhibitor after tamoxifen, but discontinued this herself due to poor tolerance. Most recent left mammogram was done as screening on 04-15-2012 with heterogeneously dense tissue and questioned asymmetry, followed by diagnostic left mamogram on 04-28-12 which had no findings of concern. 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 She had nailing of impending left femur fracture by Dr [name] on 06-26-15. Pathology [phone] from 06-26-15 showed metastatic adenocarcinoma , strongly ER positive 95%, PR positive 2%, HER 2 positive 3+. Plain xrays suggested T1 and left 4th rib involvement, tho these also did not show on  bone scan 07-01-15; like unremarkable CT, bone scan showed only activity in left femur area of surgery. PET 07-19-15 showed  extensive bone involvement axial and proximal appendicular skeleton, "severe multifocal osseous metastatic disease, much greater than anticipated based on prior imaging", involved nodes right subpectoral and right internal mammary, and no distant extraosseous mets.  She had radiation to left femur 30 cGy completed 07-26-15. She continued IV bisphosphonate with zometa on 08-07-15 and had first taxotere herceptin perjeta on 08-09-15. Perjeta held cycle 3 after diarrhea cycles 1 and 2. Perjeta resumed at 50% dosing cycle 4.   Objective:  Vital signs in last 24 hours:  BP 115/61 (BP Location: Right Arm, Patient Position: Sitting)   Pulse 88   Temp 97.5 F (36.4 C) (Oral)   Resp 18   Ht 5' 7"  (1.702 m)   Wt 121 lb 1.6 oz (54.9 kg)   SpO2 99%   BMI 18.97 kg/m  Weight down 3.5 lbs. Looks mildly uncomfortable but does not appear in acute distress. Alert, oriented and appropriate. Communicates easily with interpreter. Ambulatory with some difficulty, favoring left leg.  Alopecia  HEENT:PERRL, sclerae not icteric. Oral mucosa moist without lesions, posterior pharynx clear.  Neck supple. No JVD.  Lymphatics:no supraclavicular or inguinal adenopathy Resp: clear to auscultation bilaterally Cardio: regular rate and rhythm. No gallop. GI: soft, nontender, not distended, no mass or organomegaly. Some normal bowel sounds. . Musculoskeletal/ Extremities: Left thigh with surgical incisions not remarkable, some swelling compared with right thigh, not tight, no cords, not pitting. Slight erythema thruout, not hot.  Neuro: no change peripheral neuropathy. Otherwise nonfocal. PSYCH appropriate mood and affect Skin without rash, ecchymosis, petechiae Portacath-without erythema or tenderness  Lab Results:  Results for orders placed or performed in visit on 11/18/15  CBC with Differential  Result Value Ref Range   WBC 14.1 (H) 3.9 - 10.3 10e3/uL   NEUT# 11.9 (H) 1.5 - 6.5 10e3/uL   HGB 10.2 (L) 11.6 - 15.9  g/dL   HCT 30.7 (L) 34.8 - 46.6 %   Platelets 231 145 - 400 10e3/uL   MCV 92.7 79.5 - 101.0 fL   MCH 30.8 25.1 - 34.0 pg   MCHC 33.3 31.5 - 36.0 g/dL   RBC 3.31 (L) 3.70 - 5.45 10e6/uL   RDW 15.5 (H) 11.2 - 14.5 %   lymph# 1.4 0.9 - 3.3 10e3/uL   MONO# 0.7 0.1 - 0.9 10e3/uL   Eosinophils Absolute 0.0 0.0 - 0.5 10e3/uL   Basophils Absolute 0.1 0.0 - 0.1 10e3/uL   NEUT% 84.5 (H) 38.4 - 76.8 %   LYMPH% 10.2 (L) 14.0 - 49.7 %   MONO% 4.6 0.0 - 14.0 %   EOS% 0.1 0.0 - 7.0 %   BASO% 0.6 0.0 - 2.0 %  Comprehensive metabolic panel  Result Value Ref Range   Sodium 141 136 - 145 mEq/L   Potassium 3.6 3.5 - 5.1 mEq/L   Chloride 106 98 - 109 mEq/L   CO2 27 22 - 29 mEq/L   Glucose 96 70 - 140 mg/dl   BUN 5.3 (L) 7.0 - 26.0 mg/dL   Creatinine 0.7 0.6 - 1.1 mg/dL   Total Bilirubin 0.66 0.20 - 1.20 mg/dL   Alkaline Phosphatase 112 40 - 150 U/L   AST 10 5 - 34 U/L   ALT 10 0 - 55 U/L   Total Protein 5.9 (L) 6.4 - 8.3 g/dL   Albumin 3.1 (L) 3.5 - 5.0 g/dL   Calcium 8.5 8.4 -  10.4 mg/dL   Anion Gap 9 3 - 11 mEq/L   EGFR >90 >90 ml/min/1.73 m2  Magnesium  Result Value Ref Range   Magnesium 1.8 1.5 - 2.5 mg/dl  Cancer antigen 27.29  Result Value Ref Range   CA 27.29 78.8 (H) 0.0 - 38.6 U/mL    CA 2729 resulted after visit today is down from 93 on 10-07-15 and from 109 on 08-29-15.  Studies/Results: Echocardiogram 11-07-15 has EF 55-60%, having been 60--65% in 07-2015 as baseline for herceptin.    LEFT FEMUR 2 VIEWS  COMPARISON:  06/26/2015.  FINDINGS: ORIF left proximal femur fracture. Hardware intact. Bony structures are stable. Changes consistent with metastatic disease again noted the proximal left femur.  IMPRESSION: ORIF proximal left femur with good anatomic alignment. Hardware intact. Findings consistent with metastatic disease in the proximal left femur again noted without significant change.  EXAM: CT ABDOMEN AND PELVIS WITH CONTRAST  10-30-15  Done in  ED  COMPARISON:  Prior CT from 06/10/2015.  FINDINGS: Lower chest: Mild hazy subsegmental atelectasis seen dependently within the visualized lung bases. Visualized lung bases are otherwise clear.  Hepatobiliary: Sub cm hypodensity within the central aspect the liver noted, too small the characterize, but stable from prior studies. Again, this likely reflects a benign lesion. Liver otherwise unremarkable. Gallbladder surgically absent. No biliary dilatation.  Pancreas: Pancreas within normal limits.  Spleen: Spleen within normal limits.  Adrenals/Urinary Tract: Adrenal glands are normal. Kidneys equal in size with symmetric enhancement. Note 11 mm hypodense lesion within the left kidney measures intermediate density (51 pounds foot pain and), indeterminate. No other focal renal mass. No nephrolithiasis or hydronephrosis. Ureters are of normal caliber without acute abnormality. Bladder within normal limits.  Stomach/Bowel: Stomach within normal limits. Few mildly prominent loops of bowel seen scattered throughout the abdomen measuring up to 2.2 cm. Scattered intraluminal fluid density with a few air-fluid levels. No overt evidence for obstruction. Fluid density present within the distal colon as well. Findings favored to reflect sequela of acute enteritis/diarrheal illness. No other significant inflammatory changes seen about the bowels. No findings to suggest acute appendicitis.  Vascular/Lymphatic: Normal intravascular enhancement seen throughout the intra-abdominal aorta and its branch vessels. No adenopathy.  Reproductive: Uterus within normal limits. Ovaries not discretely identified.  Other: Trace free fluid within the pelvis. No free intraperitoneal air.  Musculoskeletal: No acute osseous abnormality. Few scattered lucent lesions present within the pelvis, most prominently seen within the right iliac wing and measures 16 mm (series 2, image 59).  Few additional lesions noted within the spine. Findings likely related to known widespread osseous metastases. Intra medullary fixation rod new from most recent CT from 06/10/2015. Hazy soft tissue stranding lateral to the left hip (Series 2, image 77), suspected to be postoperative in nature.  IMPRESSION: 1. Few mildly prominent fluid-filled loops of small bowel scattered throughout the abdomen, with intraluminal fluid density within the distal colon. Given the provided history, findings are suggestive of acute enteritis/diarrheal illness. 2. No other acute intra-abdominal or pelvic process identified. 3. Widespread osseous metastatic disease, better evaluated on most recent PET-CT from 07/19/2015. No pathologic fracture or other complication. 4. 11 mm hypodensity within the left kidney. This lesion measures intermediate density, and is indeterminate. While this lesion is similar in size relative to recent studies, this is increased in size relative to prior study from 2012. Further evaluation with dedicated renal mass protocol CT and/or MRI is recommended for complete characterization.PACs images reviewed by MD   PACs  images reviewed by MD    Medications: I have reviewed the patient's current medications. Since 11-01-15 patient has requested refill on Depakote from this office, the depakote originally prescribed by neurology possibly for neuropathy or HA, per patient taking 250 mg at hs (not dose listed in EMR). Again per patient, the neurology office requested this be filled by oncology -?. She has been using hydrocodone 7.5 mg for left leg pain.   DISCUSSION Symptoms since most recent chemotherapy similar to what she has had previously, with diarrhea less since perjeta dose reduced. Reminded patient that GI symptoms will improve as she gets further out from the chemo.  Patient again asking if she can "beat" the cancer, by which she means cure; I have again explained that this  metastatic cancer cannot be cured, but that treatment is in attempt to improve and control disease as best possible. We have discussed changing from taxotere to oral aromatase inhibitor possibly after one additional cycle, with continued herceptin and perjeta.   LLE pain and swelling: etiology not clear and I would appreciate Dr [name] seeing her. I have spoken directly with that office now, patient worked in for late this afternoon after IVF and zometa as planned at [facility]. If nothing obvious from orthopedic standpoint, will ask lymphedema PT to see. Note may have more edema related to the taxotere.  I have asked patient to let us know what Dr [name] suggests after she sees him today.   I will see her back on 11-25-15, with decision then re whether to hold taxotere with cycle 6 due ~ 11-29-15. Note also PET or MRI much more useful that other imaging for her bone involvement.    Assessment/Plan:  1.Metastatic ER+, HER 2 + breast cancer extensively involving bone: history of T1N1 right breast carcinoma age 50, ER/PR +, HER 2 + inNov 1999, no HER 2 therapy in 1999. Post nailing of impending fracture left femur by Dr [name] 06-26-15 and localradiation. FirstHerceptin, Perjeta and taxotere 08-09-15, with on pro neulasta; zometa every 4 weeks. Perjeta held cycle 3 due to diarrhea, resumed with cycle 4 50% dosing. . Will keep Perjeta dose at 50%. Expect to change taxotere to aromatase inhibitor after this cycle or next, and continue herceptin perjeta. 2. Echocardiogram 08-05-15 with EF 60-65% and on 11-07-15 EF 55-60% , prior adriamycin < 300 mg/m2, no chest radiation. 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 Husband also deaf. All communication needs to be very clear 4.increased pain and some swelling left thigh: nothing obvious with the rod by plain xray, possibly edema related to taxotere or other. Appreciate Dr [name]  seeing her today. 5.post laparoscopic cholecystectomy 2004 6.right TRAM with original breast cancer 1999 7.premature surgical menopause, post BSO 2065fr pelvic pain and ovarian cysts, likely was also beneficial from standpoint of the breast cancer.  8..claustrophobia: required full sedation for hip MRI. 9. Marked iron deficiency anemia: feraheme given 06-29-15 and 07-22-15. Chemo anemia now, not symptomatic to require transfusion 10. PAC placed by IR 08-06-15. Peripheral IV access limited to LUE due to breast cancer surgery/ radiation 11.Possible HA with zofran, tho we had previously thought this was not the case. Antiemetics unfortunately very limited by med intolerances, but did well cycle 3 with EMEND and zofran. Note she has refusedto use ativan SL, discussed.  Note on depakote by neurology for HA 12.diarrhea fromperjeta,C diff negative. Less diarrhea with decreased dose of perjeta, using  imodium prn 13.has been given information on advance directives.  14..hypokalemia from severePerjeta diarrhea previously 15. flu vaccine given 10-14-15 16.Seen by [facility] last 07-2014 for vertigo and HA, on depakote by neurology for HA (or neuropathy?). Note she does not tolerate lying on stomach due to vertigo      All questions answered and patient seems to understand discussion and plans. Time spent 30 min including >50% counseling and coordination of care. CC Drs [name], Loleta Rose, MD   11/19/2015, 8:29 PM

## 2015-11-18 NOTE — Patient Instructions (Signed)
Zoledronic Acid injection (Hypercalcemia, Oncology)  What is this medicine?  ZOLEDRONIC ACID (ZOE le dron ik AS id) lowers the amount of calcium loss from bone. It is used to treat too much calcium in your blood from cancer. It is also used to prevent complications of cancer that has spread to the bone.  This medicine may be used for other purposes; ask your health care provider or pharmacist if you have questions.  What should I tell my health care provider before I take this medicine?  They need to know if you have any of these conditions:  -aspirin-sensitive asthma  -cancer, especially if you are receiving medicines used to treat cancer  -dental disease or wear dentures  -infection  -kidney disease  -receiving corticosteroids like dexamethasone or prednisone  -an unusual or allergic reaction to zoledronic acid, other medicines, foods, dyes, or preservatives  -pregnant or trying to get pregnant  -breast-feeding  How should I use this medicine?  This medicine is for infusion into a vein. It is given by a health care professional in a hospital or clinic setting.  Talk to your pediatrician regarding the use of this medicine in children. Special care may be needed.  Overdosage: If you think you have taken too much of this medicine contact a poison control center or emergency room at once.  NOTE: This medicine is only for you. Do not share this medicine with others.  What if I miss a dose?  It is important not to miss your dose. Call your doctor or health care professional if you are unable to keep an appointment.  What may interact with this medicine?  -certain antibiotics given by injection  -NSAIDs, medicines for pain and inflammation, like ibuprofen or naproxen  -some diuretics like bumetanide, furosemide  -teriparatide  -thalidomide  This list may not describe all possible interactions. Give your health care provider a list of all the medicines, herbs, non-prescription drugs, or dietary supplements you use. Also  tell them if you smoke, drink alcohol, or use illegal drugs. Some items may interact with your medicine.  What should I watch for while using this medicine?  Visit your doctor or health care professional for regular checkups. It may be some time before you see the benefit from this medicine. Do not stop taking your medicine unless your doctor tells you to. Your doctor may order blood tests or other tests to see how you are doing.  Women should inform their doctor if they wish to become pregnant or think they might be pregnant. There is a potential for serious side effects to an unborn child. Talk to your health care professional or pharmacist for more information.  You should make sure that you get enough calcium and vitamin D while you are taking this medicine. Discuss the foods you eat and the vitamins you take with your health care professional.  Some people who take this medicine have severe bone, joint, and/or muscle pain. This medicine may also increase your risk for jaw problems or a broken thigh bone. Tell your doctor right away if you have severe pain in your jaw, bones, joints, or muscles. Tell your doctor if you have any pain that does not go away or that gets worse.  Tell your dentist and dental surgeon that you are taking this medicine. You should not have major dental surgery while on this medicine. See your dentist to have a dental exam and fix any dental problems before starting this medicine. Take good care   your doctor or health care professional as soon as possible: -allergic reactions like skin rash, itching or hives, swelling of the face, lips, or tongue -anxiety, confusion, or depression -breathing problems -changes in vision -eye pain -feeling faint or lightheaded, falls -jaw pain,  especially after dental work -mouth sores -muscle cramps, stiffness, or weakness -redness, blistering, peeling or loosening of the skin, including inside the mouth -trouble passing urine or change in the amount of urine Side effects that usually do not require medical attention (report to your doctor or health care professional if they continue or are bothersome): -bone, joint, or muscle pain -constipation -diarrhea -fever -hair loss -irritation at site where injected -loss of appetite -nausea, vomiting -stomach upset -trouble sleeping -trouble swallowing -weak or tired This list may not describe all possible side effects. Call your doctor for medical advice about side effects. You may report side effects to FDA at 1-800-FDA-1088. Where should I keep my medicine? This drug is given in a hospital or clinic and will not be stored at home. NOTE: This sheet is a summary. It may not cover all possible information. If you have questions about this medicine, talk to your doctor, pharmacist, or health care provider.    2016, Elsevier/Gold Standard. (2013-05-27 14:19:39)   Dehydration, Adult Dehydration is a condition in which you do not have enough fluid or water in your body. It happens when you take in less fluid than you lose. Vital organs such as the kidneys, brain, and heart cannot function without a proper amount of fluids. Any loss of fluids from the body can cause dehydration.  Dehydration can range from mild to severe. This condition should be treated right away to help prevent it from becoming severe. CAUSES  This condition may be caused by:  Vomiting.  Diarrhea.  Excessive sweating, such as when exercising in hot or humid weather.  Not drinking enough fluid during strenuous exercise or during an illness.  Excessive urine output.  Fever.  Certain medicines. RISK FACTORS This condition is more likely to develop in:  People who are taking certain medicines that cause  the body to lose excess fluid (diuretics).   People who have a chronic illness, such as diabetes, that may increase urination.  Older adults.   People who live at high altitudes.   People who participate in endurance sports.  SYMPTOMS  Mild Dehydration  Thirst.  Dry lips.  Slightly dry mouth.  Dry, warm skin. Moderate Dehydration  Very dry mouth.   Muscle cramps.   Dark urine and decreased urine production.   Decreased tear production.   Headache.   Light-headedness, especially when you stand up from a sitting position.  Severe Dehydration  Changes in skin.   Cold and clammy skin.   Skin does not spring back quickly when lightly pinched and released.   Changes in body fluids.   Extreme thirst.   No tears.   Not able to sweat when body temperature is high, such as in hot weather.   Minimal urine production.   Changes in vital signs.   Rapid, weak pulse (more than 100 beats per minute when you are sitting still).   Rapid breathing.   Low blood pressure.   Other changes.   Sunken eyes.   Cold hands and feet.   Confusion.  Lethargy and difficulty being awakened.  Fainting (syncope).   Short-term weight loss.   Unconsciousness. DIAGNOSIS  This condition may be diagnosed based on your symptoms. You may also have tests  to determine how severe your dehydration is. These tests may include:   Urine tests.   Blood tests.  TREATMENT  Treatment for this condition depends on the severity. Mild or moderate dehydration can often be treated at home. Treatment should be started right away. Do not wait until dehydration becomes severe. Severe dehydration needs to be treated at the hospital. Treatment for Mild Dehydration  Drinking plenty of water to replace the fluid you have lost.   Replacing minerals in your blood (electrolytes) that you may have lost.  Treatment for Moderate Dehydration  Consuming oral rehydration  solution (ORS). Treatment for Severe Dehydration  Receiving fluid through an IV tube.   Receiving electrolyte solution through a feeding tube that is passed through your nose and into your stomach (nasogastric tube or NG tube).  Correcting any abnormalities in electrolytes. HOME CARE INSTRUCTIONS   Drink enough fluid to keep your urine clear or pale yellow.   Drink water or fluid slowly by taking small sips. You can also try sucking on ice cubes.  Have food or beverages that contain electrolytes. Examples include bananas and sports drinks.  Take over-the-counter and prescription medicines only as told by your health care provider.   Prepare ORS according to the manufacturer's instructions. Take sips of ORS every 5 minutes until your urine returns to normal.  If you have vomiting or diarrhea, continue to try to drink water, ORS, or both.   If you have diarrhea, avoid:   Beverages that contain caffeine.   Fruit juice.   Milk.   Carbonated soft drinks.  Do not take salt tablets. This can lead to the condition of having too much sodium in your body (hypernatremia).  SEEK MEDICAL CARE IF:  You cannot eat or drink without vomiting.  You have had moderate diarrhea during a period of more than 24 hours.  You have a fever. SEEK IMMEDIATE MEDICAL CARE IF:   You have extreme thirst.  You have severe diarrhea.  You have not urinated in 6-8 hours, or you have urinated only a small amount of very dark urine.  You have shriveled skin.  You are dizzy, confused, or both.   This information is not intended to replace advice given to you by your health care provider. Make sure you discuss any questions you have with your health care provider.   Document Released: 12/29/2004 Document Revised: 09/19/2014 Document Reviewed: 05/16/2014 Elsevier Interactive Patient Education [employer].

## 2015-11-19 ENCOUNTER — Telehealth: Payer: Self-pay

## 2015-11-19 LAB — CANCER ANTIGEN 27.29: CAN 27.29: 78.8 U/mL — AB (ref 0.0–38.6)

## 2015-11-19 NOTE — Progress Notes (Signed)
Office Visit Note   Patient: [name]           Date of Birth: [birth year]           MRN: [id number] Visit Date: 11/18/2015              Requested by: [name], MD [place], [place] [id number] PCP: Evette Doffing, MD   Assessment & Plan: Visit Diagnoses:  1. Pain of left hip joint   2. Metastatic bone cancer [facility])     Plan: Patient can follow-up with her oncologist. Two-view x-rays of the left hip shows good position and alignment. There is some endosteal scalloping with the tumor was present. On the frog leg x-ray there is slight callus formation which is bone reaction. No areas of bone thinning that would suggest an impending fracture. No AVN. Patient is finishing up her last chemotherapy treatment. If she continues to have pain her oncologist can consider if he feels that it might be radiosensitive.  Follow-Up Instructions: Return if symptoms worsen or fail to improve.   Orders:  No orders of the defined types were placed in this encounter.  No orders of the defined types were placed in this encounter. Patient had day left Biomet long affixes nail placed 06/26/2015 4 metastatic disease in the intramedullary canal and subcortical region and some involvement of the cortical bone and periosteum. She was doing well for. Time after the surgery is now having increased pain in her left hip that she states is constant. It bothers her in both the ambulation when it's worse as well as sitting or supine position. Patient has had sign translator since she is deaf. She points to the subtrochanteric region is air where she has pain and points to radiates down toward her knee. Bone scan 07/01/2015 showed increased activity in the proximal left femur and no other areas of particular abnormality that would suggest metastatic disease.    Procedures: No procedures performed   Clinical Data: No additional findings.   Subjective: Chief Complaint  Patient presents with  .  Left Hip - Edema, Weakness, Pain    Weakness  Associated symptoms include weakness. Pertinent negatives include no abdominal pain, chest pain, chills, coughing, diaphoresis or rash.    Review of Systems  Constitutional: Negative for chills and diaphoresis.       Patient with history of breast cancer finishing up current chemotherapy for metastatic disease left proximal femur with stabilization with trochanteric nail.  HENT: Negative for ear discharge, ear pain and nosebleeds.   Eyes: Negative for discharge and visual disturbance.  Respiratory: Negative for cough, choking and shortness of breath.   Cardiovascular: Negative for chest pain and palpitations.  Gastrointestinal: Negative for abdominal distention and abdominal pain.  Endocrine: Negative for cold intolerance and heat intolerance.  Genitourinary: Negative for flank pain and hematuria.  Skin: Negative for rash and wound.  Neurological: Positive for weakness. Negative for seizures and speech difficulty.  Hematological: Negative for adenopathy. Does not bruise/bleed easily.  Psychiatric/Behavioral: Negative for agitation and suicidal ideas.     Objective: Vital Signs: BP (!) 102/52   Pulse 85   Ht 5\' 7"  (1.702 m)   Wt 121 lb (54.9 kg)   BMI 18.95 kg/m   Physical Exam  Constitutional: She is oriented to person, place, and time. She appears well-developed.  HENT:  Head: Normocephalic.  Right Ear: External ear normal.  Left Ear: External ear normal.  Patient is deaf and  has an interpreter with her  Eyes: Pupils are equal, round, and reactive to light.  Neck: No tracheal deviation present. No thyromegaly present.  Cardiovascular: Normal rate.   Pulmonary/Chest: Effort normal.  Abdominal: Soft.  Neurological: She is alert and oriented to person, place, and time.  Skin: Skin is warm and dry.  Psychiatric: She has a normal mood and affect. Her behavior is normal.    Ortho Exam trochanter incision left is well-healed.  She has some tenderness with the squeezing over the proximal thigh. Hip range of motion is good although she does complain of pain. She finds pain with ambulation distal pulses are 2+ knee reaches full extension.  Specialty Comments:  No specialty comments available.  Imaging: No results found.   PMFS History: Patient Active Problem List   Diagnosis Date Noted  . Leg pain, left 11/08/2015  . Chemotherapy-induced peripheral neuropathy ([place]) 11/03/2015  . Port catheter in place 09/26/2015  . Chemotherapy induced nausea and vomiting 08/13/2015  . Cancer related pain 08/13/2015  . Portacath in place 08/13/2015  . Drug allergy, multiple 08/13/2015  . 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 [name], right ([place]) 07/14/2015  . Other iron deficiency anemias 07/14/2015  . Claustrophobia 07/14/2015  . Status post laparoscopic cholecystectomy 07/14/2015  . S/P TRAM (transverse rectus abdominis muscle) flap breast reconstruction 07/14/2015  . Thyroid nodule 07/09/2015  . Constipation due to pain medication   . Metastasis from breast cancer ([place])   . Left hip pain   . Metastatic bone cancer ([place])   . Depression   . Generalized anxiety disorder   . Esophageal reflux   . Bony metastasis ([place]) 06/25/2015  . Breast cancer ([place]) 06/25/2015  . Genetic testing 04/04/2015  . Congenital deafness 02/26/2015  . Noncompliance with therapeutic plan 02/26/2015  . Hx of BSO (bilateral salpingo-oophorectomy) 02/26/2015  . Premature surgical menopause 02/26/2015  . Benign paroxysmal positional vertigo 07/24/2014  . Migraine 07/24/2014  . RLQ abdominal pain 09/30/2013  . Dysuria 09/30/2013  . Hx of breast cancer 04/28/2013  . Colon cancer screening 04/28/2013  . Migraine without aura, with intractable migraine, so stated, without mention of status migrainosus 06/22/2012  . Dizziness 05/14/2011  . Pharyngitis, acute  09/04/2010  . BACK STRAIN, LUMBAR 01/21/2010  . DE QUERVAIN'S TENOSYNOVITIS, LEFT WRIST 10/07/2009  . EMESIS 02/05/2009  . ABDOMINAL PAIN, GENERALIZED 02/05/2009  . DIZZINESS, CHRONIC 01/25/2007  . INSOMNIA 01/25/2007  . Hearing loss 03/11/2006  . CERVICAL DYSPLASIA 03/11/2006  . MENSTRUATION, PAINFUL 03/11/2006   Past Medical History:  Diagnosis Date  . Abnormal Pap smear   . Anxiety   . Breast cancer ([place])   . Cancer ([place]) 1999   breast-s/p mastectomy, chemo, rad  . CIN I (cervical intraepithelial neoplasia I) 2003   by colpo  . Congenital deafness   . Deaf   . Depression   . Radiation 07/12/15-07/26/15   left femur 30 Gy  . S/P bilateral oophorectomy     Family History  Problem Relation Age of Onset  . Hypertension Mother   . Seizures Mother   . Alzheimer's disease Maternal Uncle   . Pancreatitis Maternal Grandmother   . Heart attack Maternal Grandfather     Past Surgical History:  Procedure Laterality Date  . ABDOMINAL HYSTERECTOMY    . INTRAMEDULLARY (IM) NAIL INTERTROCHANTERIC Left 06/26/2015   Procedure: LEFT BIOMET LONG AFFIXS NAIL;  Surgeon: [name], MD;  Location:  [facility] OR;  Service: Orthopedics;  Laterality: Left;  . IR GENERIC HISTORICAL  08/06/2015   IR US GUIDE VASC ACCESS LEFT 08/06/2015 Aletta Edouard, MD [facility]-INTER[facility] RAD  . IR GENERIC HISTORICAL  08/06/2015   IR FLUORO GUIDE CV LINE LEFT 08/06/2015 Aletta Edouard, MD [facility]-INTER[facility] RAD  . MASTECTOMY     right breast  . MASTECTOMY    . OVARIAN CYST REMOVAL    . RADIOLOGY WITH ANESTHESIA Left 06/25/2015   Procedure: MRI OF LEFT HIP WITH OR WITHOUT CONTRAST;  Surgeon: Medication Radiologist, MD;  Location: [facility];  Service: Radiology;  Laterality: Left;  DR. MCINTYRE/MRI  . TUBAL LIGATION     Social History   Occupational History  . Not on file.   Social History Main Topics  . Smoking status: Never Smoker  . Smokeless tobacco: Never Used  . Alcohol use No  . Drug use: No  . Sexual activity: Yes    Birth  control/ protection: Surgical

## 2015-11-19 NOTE — Telephone Encounter (Signed)
-----   Message from Gordy Levan, MD sent at 11/19/2015  1:40 PM EST ----- Regarding: information from Dr [name] RN note seen.  Please request note and reports of any xrays done by Dr [name] on 11-18-15, hopefully to get the information by later this week.  Go ahead with lymphedema PT referral please  I will see her on 11-13 as planned.  thanks

## 2015-11-19 NOTE — Telephone Encounter (Signed)
[name] said that Dr. [name] says that the left thigh is fine from a surgical standpoint.  No infection and hardware is fine.   The bad news is that the increased pain is from increased spread of the cancer. Told her that this message will be relayed to Dr. Marko Plume. I did not know if Dr. Marko Plume will call you back. You have an appointment on 11-25-15 at 1130. [name] stated OK.Marland Kitchen

## 2015-11-19 NOTE — Telephone Encounter (Signed)
LM in Dr. Duaine Dredge medical records requesting office note from 11-6 and the reports of any  X-rays done .

## 2015-11-20 ENCOUNTER — Ambulatory Visit (INDEPENDENT_AMBULATORY_CARE_PROVIDER_SITE_OTHER): Payer: Medicaid Other

## 2015-11-20 ENCOUNTER — Ambulatory Visit (INDEPENDENT_AMBULATORY_CARE_PROVIDER_SITE_OTHER): Payer: Medicaid Other | Admitting: Orthopaedic Surgery

## 2015-11-20 ENCOUNTER — Encounter (INDEPENDENT_AMBULATORY_CARE_PROVIDER_SITE_OTHER): Payer: Self-pay | Admitting: Orthopaedic Surgery

## 2015-11-20 DIAGNOSIS — M25552 Pain in left hip: Secondary | ICD-10-CM

## 2015-11-20 DIAGNOSIS — C419 Malignant neoplasm of bone and articular cartilage, unspecified: Secondary | ICD-10-CM

## 2015-11-20 DIAGNOSIS — C7951 Secondary malignant neoplasm of bone: Secondary | ICD-10-CM

## 2015-11-20 DIAGNOSIS — C50911 Malignant neoplasm of unspecified site of right female breast: Secondary | ICD-10-CM

## 2015-11-20 NOTE — Progress Notes (Signed)
Office Visit Note   Patient: [name]           Date of Birth: May 08, 1965           MRN: [id number] Visit Date: 11/20/2015              Requested by: [name], MD [place], [place] [address] PCP: Evette Doffing, MD   Assessment & Plan: Visit Diagnoses:  1. Pain in left hip    Patient returned to complete her visit and obtain two-view x-rays of her left hip which were not done at the last visit. Plan:   Follow-Up Instructions: No Follow-up on file.   Orders:  Orders Placed This Encounter  Procedures  . XR HIP UNILAT W OR W/O PELVIS 2-3 VIEWS LEFT   No orders of the defined types were placed in this encounter.     Procedures: No procedures performed   Clinical Data: No additional findings.   Subjective: Chief Complaint  Patient presents with  . Left Hip - Pain    Patient states she is having pain 7/10 pain level. More pain when weight bearing. Difficult sleeping  Pain radiates down thigh     Review of Systems no change from 2 days ago   Objective: Vital Signs: There were no vitals taken for this visit.  Physical Exam no change from 2 days ago  Ortho Exam no change from 2 days ago  Specialty Comments:  No specialty comments available.  Imaging: No results found.   PMFS History: Patient Active Problem List   Diagnosis Date Noted  . Leg pain, left 11/08/2015  . Chemotherapy-induced peripheral neuropathy ([place]) 11/03/2015  . Port catheter in place 09/26/2015  . Chemotherapy induced nausea and vomiting 08/13/2015  . Cancer related pain 08/13/2015  . Portacath in place 08/13/2015  . Drug allergy, multiple 08/13/2015  . 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 [name], right ([place]) 07/14/2015  . Other iron deficiency anemias 07/14/2015  . Claustrophobia 07/14/2015  . Status post laparoscopic cholecystectomy 07/14/2015  . S/P TRAM (transverse  rectus abdominis muscle) flap breast reconstruction 07/14/2015  . Thyroid nodule 07/09/2015  . Constipation due to pain medication   . Metastasis from breast cancer ([place])   . Left hip pain   . Metastatic bone cancer ([place])   . Depression   . Generalized anxiety disorder   . Esophageal reflux   . Bony metastasis ([place]) 06/25/2015  . Breast cancer ([place]) 06/25/2015  . Genetic testing 04/04/2015  . Congenital deafness 02/26/2015  . Noncompliance with therapeutic plan 02/26/2015  . Hx of BSO (bilateral salpingo-oophorectomy) 02/26/2015  . Premature surgical menopause 02/26/2015  . Benign paroxysmal positional vertigo 07/24/2014  . Migraine 07/24/2014  . RLQ abdominal pain 09/30/2013  . Dysuria 09/30/2013  . Hx of breast cancer 04/28/2013  . Colon cancer screening 04/28/2013  . Migraine without aura, with intractable migraine, so stated, without mention of status migrainosus 06/22/2012  . Dizziness 05/14/2011  . Pharyngitis, acute 09/04/2010  . BACK STRAIN, LUMBAR 01/21/2010  . DE QUERVAIN'S TENOSYNOVITIS, LEFT WRIST 10/07/2009  . EMESIS 02/05/2009  . ABDOMINAL PAIN, GENERALIZED 02/05/2009  . DIZZINESS, CHRONIC 01/25/2007  . INSOMNIA 01/25/2007  . Hearing loss 03/11/2006  . CERVICAL DYSPLASIA 03/11/2006  . MENSTRUATION, PAINFUL 03/11/2006   Past Medical History:  Diagnosis Date  . Abnormal Pap smear   . Anxiety   . Breast cancer ([place])   .  Cancer ([place]) 1999   breast-s/p mastectomy, chemo, rad  . CIN I (cervical intraepithelial neoplasia I) 2003   by colpo  . Congenital deafness   . Deaf   . Depression   . Radiation 07/12/15-07/26/15   left femur 30 Gy  . S/P bilateral oophorectomy     Family History  Problem Relation Age of Onset  . Hypertension Mother   . Seizures Mother   . Alzheimer's disease Maternal Uncle   . Pancreatitis Maternal Grandmother   . Heart attack Maternal Grandfather     Past Surgical History:  Procedure Laterality Date  . ABDOMINAL HYSTERECTOMY      . INTRAMEDULLARY (IM) NAIL INTERTROCHANTERIC Left 06/26/2015   Procedure: LEFT BIOMET LONG AFFIXS NAIL;  Surgeon: [name], MD;  Location: [facility];  Service: Orthopedics;  Laterality: Left;  . IR GENERIC HISTORICAL  08/06/2015   IR US GUIDE VASC ACCESS LEFT 08/06/2015 Aletta Edouard, MD [facility]  . IR GENERIC HISTORICAL  08/06/2015   IR FLUORO GUIDE CV LINE LEFT 08/06/2015 Aletta Edouard, MD [facility]  . MASTECTOMY     right breast  . MASTECTOMY    . OVARIAN CYST REMOVAL    . RADIOLOGY WITH ANESTHESIA Left 06/25/2015   Procedure: MRI OF LEFT HIP WITH OR WITHOUT CONTRAST;  Surgeon: Medication Radiologist, MD;  Location: [facility];  Service: Radiology;  Laterality: Left;  DR. MCINTYRE/MRI  . TUBAL LIGATION     Social History   Occupational History  . Not on file.   Social History Main Topics  . Smoking status: Never Smoker  . Smokeless tobacco: Never Used  . Alcohol use No  . Drug use: No  . Sexual activity: Yes    Birth control/ protection: Surgical

## 2015-11-20 NOTE — Telephone Encounter (Signed)
Tammy in HIM with  Dr. Duaine Dredge called and stated that they are now on epic.  The office notes and x-ray reports are in epic. Did see office note and x-ray report on epic.  Will relay this information to Dr. Marko Plume.

## 2015-11-22 ENCOUNTER — Ambulatory Visit: Payer: Medicaid Other

## 2015-11-24 ENCOUNTER — Other Ambulatory Visit: Payer: Self-pay | Admitting: Oncology

## 2015-11-25 ENCOUNTER — Ambulatory Visit (HOSPITAL_BASED_OUTPATIENT_CLINIC_OR_DEPARTMENT_OTHER): Payer: Medicaid Other | Admitting: Oncology

## 2015-11-25 VITALS — BP 111/68 | HR 95 | Temp 97.7°F | Resp 16 | Ht 67.0 in | Wt 125.0 lb

## 2015-11-25 DIAGNOSIS — K521 Toxic gastroenteritis and colitis: Secondary | ICD-10-CM

## 2015-11-25 DIAGNOSIS — M7989 Other specified soft tissue disorders: Secondary | ICD-10-CM

## 2015-11-25 DIAGNOSIS — H905 Unspecified sensorineural hearing loss: Secondary | ICD-10-CM | POA: Diagnosis not present

## 2015-11-25 DIAGNOSIS — M8589 Other specified disorders of bone density and structure, multiple sites: Secondary | ICD-10-CM

## 2015-11-25 DIAGNOSIS — F40240 Claustrophobia: Secondary | ICD-10-CM | POA: Diagnosis not present

## 2015-11-25 DIAGNOSIS — R197 Diarrhea, unspecified: Secondary | ICD-10-CM | POA: Diagnosis not present

## 2015-11-25 DIAGNOSIS — C7951 Secondary malignant neoplasm of bone: Secondary | ICD-10-CM

## 2015-11-25 DIAGNOSIS — M79652 Pain in left thigh: Secondary | ICD-10-CM | POA: Diagnosis not present

## 2015-11-25 DIAGNOSIS — D6481 Anemia due to antineoplastic chemotherapy: Secondary | ICD-10-CM

## 2015-11-25 DIAGNOSIS — C773 Secondary and unspecified malignant neoplasm of axilla and upper limb lymph nodes: Secondary | ICD-10-CM | POA: Diagnosis not present

## 2015-11-25 DIAGNOSIS — C50911 Malignant neoplasm of unspecified site of right female breast: Secondary | ICD-10-CM | POA: Diagnosis not present

## 2015-11-25 DIAGNOSIS — Z95828 Presence of other vascular implants and grafts: Secondary | ICD-10-CM

## 2015-11-25 DIAGNOSIS — I890 Lymphedema, not elsewhere classified: Secondary | ICD-10-CM

## 2015-11-25 DIAGNOSIS — R112 Nausea with vomiting, unspecified: Secondary | ICD-10-CM

## 2015-11-25 DIAGNOSIS — T451X5A Adverse effect of antineoplastic and immunosuppressive drugs, initial encounter: Secondary | ICD-10-CM

## 2015-11-25 NOTE — Progress Notes (Signed)
OFFICE PROGRESS NOTE   November 25, 2015   Physicians: Gery Pray, Rodell Perna, Ryan Grunz/ Valetta Fuller [name]([facility]), C.Harraway Janelle Floor (neurology)  INTERVAL HISTORY:  Patient is seen, together with husband and sign language interpreter, in continuing attention to metastatic ER+ HER2+ right breast cancer extensively involving bone. She has had 5 cycles of taxotere herceptin perjeta thru 11-08-15, and zometa most recently 11-18-15.  Plan is ~ 6 cycles with taxotere, then AI with herceptin/ perjeta.  Note her bone disease is difficult to image, best with PET or MRI. Echocardiogram 11-07-15 EF 55-60%  Patient was seen by Dr [name] on 11-18-15 with increased swelling and pain left thigh, in area of femoral rodding, nothing of concern related to the surgery or further impending fracture, no AVN.  She is to see lymphedema PT on 11-27-15.   Patient reports that she has felt much better again for last 2-3 days, with appetite much improved and po intake great. Left thigh swelling is a little better and not quite as uncomfortable, tho not resolved. Energy is better, no new or different pain, no nausea or vomiting in several days, bowels moving without diarrhea now, no SOB or cough, no fever or symptoms of infection, no problems with PAC, no bleeding. No neuropathy in fingers.  Remainder of 10 point Review of Systems negative.    PAC by IR 08-06-15 Feraheme 06-29-15 and 07-22-15 Genetics testing negative by Breast Ovarian panel 03-2015 Left [facility] 03-15-15 heterogeneously dense tissue otherwise negative. CA 2729 on 06-26-15: 72.5 and 102 in early 08-2015, shortly after beginning taxotere herceptin perjeta Flu vaccine 10-14-15  ONCOLOGIC HISTORY  Patient had 1.4 cm poorly differentiated right breast carcinoma diagnosed in Nov 1999 at age 50, 1/7 axillary nodes involved, ER/PR and HER 2 positive. She had mastectomy with the 7 axillary node evaluation, 4 cycles of  adriamycin/ cytoxan followed by taxotere, then five years of tamoxifen thru June 2005 (no herceptin in 1999). She did not have local radiation. She had right TRAM reconstruction. She had bilateral salpingoophorectomy in May 2005. She was briefly on aromatase inhibitor after tamoxifen, but discontinued this herself due to poor tolerance. Most recent left mammogram was done as screening on 04-15-2012 with heterogeneously dense tissue and questioned asymmetry, followed by diagnostic left mamogram on 04-28-12 which had no findings of concern. 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 She had nailing of impending left femur fracture by Dr [name] on 06-26-15. Pathology [phone] from 06-26-15 showed metastatic adenocarcinoma , strongly ER positive 95%, PR positive 2%, HER 2 positive 3+. Plain xrays suggested T1 and left 4th rib involvement, tho these also did not show on bone scan 07-01-15; like unremarkable CT, bone scan showed only activity in left femur area of surgery. PET 07-19-15 showed extensive bone involvement axial and proximal appendicular skeleton, "severe multifocal osseous metastatic disease, much greater than anticipated based on prior imaging", involved nodes right subpectoral and right internal mammary, and no distant extraosseous mets.  She had radiation to left femur 30 cGy completed 07-26-15. She continued IV bisphosphonate with zometa on 08-07-15 and had first taxotere herceptin perjeta on 08-09-15. Perjeta held cycle 3 after diarrhea cycles 1 and 2. Perjeta resumed at 50% dosing cycle 4.   Objective:  Vital signs in last 24 hours:  BP 111/68 (BP Location: Left Arm, Patient Position: Sitting)   Pulse 95   Temp  97.7 F (36.5 C) (Oral)   Resp 16   Ht 5' 7"  (1.702 m)   Wt 125 lb (56.7 kg)   SpO2 99%   BMI 19.58 kg/m  Weight up 4 lbs from 11-19-15. Alert, oriented and  appropriate. Looks much brighter and more energetic, signs rapidly and extensively with interpreter and husband. Ambulatory without difficulty, looks comfortable seated in exam room. Respirations not labored RA. Alopecia  HEENT:PERRL, sclerae not icteric. Oral mucosa moist without lesions, posterior pharynx clear.  Neck supple. No JVD.  Lymphatics:no supraclavicular adenopathy Resp: clear to auscultation bilaterally and normal percussion bilaterally Cardio: regular rate and rhythm. No gallop. GI: soft, nontender, not distended, no mass or organomegaly. Normally active bowel sounds.  Musculoskeletal/ Extremities: left thigh still larger than right, not increased from last week. No pitting edema in lower legs. No swelling UE. Back not tender. Neuro: deafness, otherwise CN, motor, cerebellar, sensory nonfocal Skin without rash, ecchymosis, petechiae Portacath-without erythema or tenderness  Lab Results:  Results for orders placed or performed in visit on 11/18/15  CBC with Differential  Result Value Ref Range   WBC 14.1 (H) 3.9 - 10.3 10e3/uL   NEUT# 11.9 (H) 1.5 - 6.5 10e3/uL   HGB 10.2 (L) 11.6 - 15.9 g/dL   HCT 30.7 (L) 34.8 - 46.6 %   Platelets 231 145 - 400 10e3/uL   MCV 92.7 79.5 - 101.0 fL   MCH 30.8 25.1 - 34.0 pg   MCHC 33.3 31.5 - 36.0 g/dL   RBC 3.31 (L) 3.70 - 5.45 10e6/uL   RDW 15.5 (H) 11.2 - 14.5 %   lymph# 1.4 0.9 - 3.3 10e3/uL   MONO# 0.7 0.1 - 0.9 10e3/uL   Eosinophils Absolute 0.0 0.0 - 0.5 10e3/uL   Basophils Absolute 0.1 0.0 - 0.1 10e3/uL   NEUT% 84.5 (H) 38.4 - 76.8 %   LYMPH% 10.2 (L) 14.0 - 49.7 %   MONO% 4.6 0.0 - 14.0 %   EOS% 0.1 0.0 - 7.0 %   BASO% 0.6 0.0 - 2.0 %  Comprehensive metabolic panel  Result Value Ref Range   Sodium 141 136 - 145 mEq/L   Potassium 3.6 3.5 - 5.1 mEq/L   Chloride 106 98 - 109 mEq/L   CO2 27 22 - 29 mEq/L   Glucose 96 70 - 140 mg/dl   BUN 5.3 (L) 7.0 - 26.0 mg/dL   Creatinine 0.7 0.6 - 1.1 mg/dL   Total Bilirubin 0.66  0.20 - 1.20 mg/dL   Alkaline Phosphatase 112 40 - 150 U/L   AST 10 5 - 34 U/L   ALT 10 0 - 55 U/L   Total Protein 5.9 (L) 6.4 - 8.3 g/dL   Albumin 3.1 (L) 3.5 - 5.0 g/dL   Calcium 8.5 8.4 - 10.4 mg/dL   Anion Gap 9 3 - 11 mEq/L   EGFR >90 >90 ml/min/1.73 m2  Magnesium  Result Value Ref Range   Magnesium 1.8 1.5 - 2.5 mg/dl  Cancer antigen 27.29  Result Value Ref Range   CA 27.29 78.8 (H) 0.0 - 38.6 U/mL   CA 2729 had been 93 on 10-07-15 and 102 on 08-29-15 Studies/Results:  No results found.  Medications: I have reviewed the patient's current medications. Taxotere dose decreased for cycle 6 to 50 mg/m2 due to concern for fluid retention LLE. OK to refill depakote from this office until seen back by neurology  DISCUSSION Explained that swelling in left thigh may be lymphedema/ fluid retention from taxotere, as  fortunately no DVT and no new orthopedic concerns. As she is improving with taxotere + herceptin perjeta, she prefers to do cycle 6 as planned, even with other related side effects. Taxotere dose adjusted as above. Patient encouraged to take advantage of good appetite to get in good nutrition now. She also agrees to IVF on days 2 and 4, which she had refused last cycle. She will have neulasta with taxotere  We have discussed in general plan to change from chemo to aromatase inhibitor and to continue herceptin perjeta after cycle 6.  She has not had teaching on AI today  Will request restaging PET after cycle 6. Note her disease was not seen on plain xrays, CT or bone scan, but clearly evident on MRI and PET.   Patient aware that care will be transferred to one of the medical oncologists with special interest in breast cancer after first of year. She requests female physician.     Assessment/Plan: 1.Metastatic ER+, HER 2 + breast cancer extensively involving bone: history of T1N1 right breast carcinoma age 37, ER/PR +, HER 2 + inNov 1999, no HER 2 therapy in 1999. Post nailing  of impending fracture left femur by Dr [name] 06-26-15 and localradiation. FirstHerceptin, Perjeta and taxotere 08-09-15, with on pro neulasta; zometa every 4 weeks. Perjeta held cycle 3 due to diarrhea, resumedwith cycle 4 at 50% dosing. . Will keep Perjeta dose at 50%. Will treat with cycle 6 on 11-29-15 as long as ANC >=1.5 and plt >=100k, taxotere reduced and perjeta dose kept at 50%. Plan change from chemo to AI after cycle 6, with herceptin perjeta and bisphosphonate to continue.  2. Echocardiogram 08-05-15 with EF 60-65% and on 11-07-15 EF 55-60% , prior adriamycin < 300 mg/m2, no chest radiation. 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 Husband also deaf.  4.increased pain and some swelling left thigh: venous doppler negative, nothing new per ortho, likely lymphedema, possibly aggravated by taxotere. Lymphedema PT and dose reduce last planned taxotere. She does use decadron around taxotere. 5.post laparoscopic cholecystectomy 2004 6.right TRAM with original breast cancer 1999 7.premature surgical menopause, post BSO 2084fr pelvic pain and ovarian cysts, likely was also beneficial from standpoint of the breast cancer.  8..claustrophobia: required full sedation for hip MRI. 9. Marked iron deficiency anemia: feraheme given 06-29-15 and 07-22-15. Chemo anemia now, not symptomatic to require transfusion 10. PAC placed by IR 08-06-15. Peripheral IV access limited to LUE due to breast cancer surgery/ radiation 11.Possible HA with zofran, tho we had previously thought this was not the case. Antiemetics unfortunately very limited by med intolerances, but did well cycle 3 with EMEND and zofran. Note she has refusedto use ativan SL, discussed. Note on depakote by neurology for HA 12.diarrhea fromperjeta,C diff negative. Less diarrhea with decreased dose of perjeta, using imodium prn 13.has been given information on  advance directives.  14..hypokalemia from severePerjeta diarrhea previously 15. flu vaccine given 10-14-15 16.[facility] and HA, on depakote by neurology for HA (or neuropathy?). Note she does not tolerate lying on stomach due to vertigo     17.osteopenic by bone density scan 03-2015: note premature menopause and plan for AI.  Will check Vit D with next labs  All questions answered and patient/ husband understand and are in agreement with recommendations and plans. Chemo, on pro neulasta, herceptin and perjeta orders placed. IVF / prn zofran under sign  and hold for 11-18 and 11-20.  Time spent 30 min including >50% counseling and coordination of care.    Evlyn Clines, MD   11/25/2015, 5:36 PM

## 2015-11-26 ENCOUNTER — Encounter: Payer: Self-pay | Admitting: Oncology

## 2015-11-26 DIAGNOSIS — K521 Toxic gastroenteritis and colitis: Secondary | ICD-10-CM | POA: Insufficient documentation

## 2015-11-26 DIAGNOSIS — M8589 Other specified disorders of bone density and structure, multiple sites: Secondary | ICD-10-CM | POA: Insufficient documentation

## 2015-11-27 ENCOUNTER — Ambulatory Visit: Payer: Medicaid Other | Attending: Oncology | Admitting: Physical Therapy

## 2015-11-27 DIAGNOSIS — M79605 Pain in left leg: Secondary | ICD-10-CM | POA: Insufficient documentation

## 2015-11-27 DIAGNOSIS — I890 Lymphedema, not elsewhere classified: Secondary | ICD-10-CM | POA: Diagnosis not present

## 2015-11-27 NOTE — Therapy (Signed)
[place], [place], [address] Phone: [phone]   Fax:  [phone]  Physical Therapy Evaluation  Patient Details  Name: [name] MRN: [id number] Date of Birth: 1965/12/25 Referring Provider: Dr. Evlyn Clines  Encounter Date: 11/27/2015      PT End of Session - 11/27/15 2122    Visit Number 1   Number of Visits 4   Date for PT Re-Evaluation 01/12/16   Authorization Type Medicaid   PT Start Time 0936   PT Stop Time 1028   PT Time Calculation (min) 52 min   Activity Tolerance Patient tolerated treatment well   Behavior During Therapy [facility] for tasks assessed/performed      Past Medical History:  Diagnosis Date  . Abnormal Pap smear   . Anxiety   . Breast cancer ([place])   . Cancer ([place]) 1999   breast-s/p mastectomy, chemo, rad  . CIN I (cervical intraepithelial neoplasia I) 2003   by colpo  . Congenital deafness   . Deaf   . Depression   . Radiation 07/12/15-07/26/15   left femur 30 Gy  . S/P bilateral oophorectomy     Past Surgical History:  Procedure Laterality Date  . ABDOMINAL HYSTERECTOMY    . INTRAMEDULLARY (IM) NAIL INTERTROCHANTERIC Left 06/26/2015   Procedure: LEFT BIOMET LONG AFFIXS NAIL;  Surgeon: [name], MD;  Location: [facility];  Service: Orthopedics;  Laterality: Left;  . IR GENERIC HISTORICAL  08/06/2015   IR US GUIDE VASC ACCESS LEFT 08/06/2015 Aletta Edouard, MD [facility]-INTERV RAD  . IR GENERIC HISTORICAL  08/06/2015   IR FLUORO GUIDE CV LINE LEFT 08/06/2015 Aletta Edouard, MD [facility]-INTERV RAD  . MASTECTOMY     right breast  . MASTECTOMY    . OVARIAN CYST REMOVAL    . RADIOLOGY WITH ANESTHESIA Left 06/25/2015   Procedure: MRI OF LEFT HIP WITH OR WITHOUT CONTRAST;  Surgeon: Medication Radiologist, MD;  Location: [facility];  Service: Radiology;  Laterality: Left;  DR. MCINTYRE/MRI  . TUBAL LIGATION      There were no vitals filed for this visit.       Subjective  Assessment - 11/27/15 0946    Subjective Left leg swelling started about a month ago.  It hurts and is swollen.  Dr. [name] said it was fine--looked fine on the x-ray.   Patient is accompained by: Interpreter   Pertinent History Had left leg pain; had an MRI and then they found I had cancer. They put a rod in my leg on 06/26/15 and was in the hospital 1 1/2 weeks.  Primary cancer was in right breast.  Right breast cancer originally in 1999 with chemotherapy and tamoxifen; she had mastectomy.  She though she had beaten the cancer and then it returned.  Had radiation treatment in July for the leg.  Will have her last chemo this Friday.     Patient Stated Goals stop the swelling in my leg and reduce that pain too   Currently in Pain? Yes   Pain Score 5    Pain Location Leg   Pain Orientation Left   Pain Descriptors / Indicators Sore;Other (Comment)  stiff   Aggravating Factors  walking, standing, lying down, sitting, but especially standing   Pain Relieving Factors lying down with a pillow underneath it, sometimes even in sitting   Effect of Pain on Daily Activities hard to sleep            [facility] PT  Assessment - 11/27/15 0001      Assessment   Medical Diagnosis lymphedema left leg; bony metastases left leg from right breast cancer   Referring Provider Dr. Evlyn Clines   Hand Dominance Right     Precautions   Precautions Other (comment)   Precaution Comments bony metastases, cancer precautions     Restrictions   Weight Bearing Restrictions No     Balance Screen   Has the patient fallen in the past 6 months No   Has the patient had a decrease in activity level because of a fear of falling?  No   Is the patient reluctant to leave their home because of a fear of falling?  No  but goes with her husband and is careful     [employer] residence   Living Arrangements Spouse/significant other  and daughter and 3 grandchildren   Type of [place] to enter   Entrance Stairs-Number of Steps 4   Entrance Stairs-Rails Can reach both   [place] One level     Prior Function   Level of Independence Independent   Vocation Unemployed   Leisure some leg lifts, but can't do them well right now     Cognition   Overall Cognitive Status Within Functional Limits for tasks assessed     Observation/Other Assessments   Observations Patient comes in wearing a mask and says it is because she has a cold   Other Surveys  --  lymph life impact scale score is 44 = 65% impairment     Ambulation/Gait   Ambulation/Gait Yes   Ambulation/Gait Assistance 6: Modified independent (Device/Increase time)   Gait Comments limited by pain           LYMPHEDEMA/ONCOLOGY QUESTIONNAIRE - 11/27/15 0959      Type   Cancer Type metastatic breast cancer     Treatment   Active Chemotherapy Treatment Yes   Past Radiation Treatment Yes     What other symptoms do you have   Are you having pitting edema Yes   [place] Sign No   Other Symptoms       Lymphedema Assessments   Lymphedema Assessments Lower extremities     Right Lower Extremity Lymphedema   30 cm Proximal to Suprapatella 49 cm   20 cm Proximal to Suprapatella 44.8 cm   10 cm Proximal to Suprapatella 37.7 cm   At Midpatella/Popliteal Crease 33 cm   30 cm Proximal to Floor at Lateral Plantar Foot 31.4 cm   20 cm Proximal to Floor at Lateral Plantar Foot 22.6 1   10  cm Proximal to Floor at Lateral Malleoli 20.9 cm   5 cm Proximal to 1st MTP Joint 21.5 cm   Across MTP Joint 21.7 cm   Around Proximal Great Toe 6.8 cm     Left Lower Extremity Lymphedema   30 cm Proximal to Suprapatella 56.4 cm   20 cm Proximal to Suprapatella 49.9 cm   10 cm Proximal to Suprapatella 42.9 cm   At Midpatella/Popliteal Crease 36.8 cm   30 cm Proximal to Floor at Lateral Plantar Foot 33.7 cm   20 cm Proximal to Floor at Lateral Plantar Foot 24.7 cm   10 cm Proximal to Floor at Lateral  Malleoli 21.5 cm   5 cm Proximal to 1st MTP Joint 21.3 cm   Across MTP Joint 21.5 cm   Around Proximal Great Toe 7 cm  [facility] Goals - 11/27/15 2135      CC Long Term Goal  #1   Title Patient will be knowledgeable about performing manual lymph drainage.   Time 4   Period Weeks   Status New     CC Long Term Goal  #2   Title Pt. will be knowledgeable about pump as well as day- and nighttime compression garments.   Time 4   Period Weeks   Status New     CC Long Term Goal  #3   Title lymphedema life impact score reduced to 30% impairment or less   Baseline 65% impairment (score of 44)   Time 4   Period Weeks   Status New            Plan - 11/27/15 2123    Clinical Impression Statement Patient is a deaf woman who is accompanied by an interpreter today.  She had right breast cancer in 1999; she developed pain and metastatic cancer was found in left femur this year, requiring it to be pinned.  She had radiation and she has been undergoing chemotherapy, the last of which she will have this week.  She has pain in left leg that is worse in a number of different positions.  Her gait is affected.  She developed lymphedema in the left leg and was referred for that problem to [place] currently.  She does have a signficant difference in the size of her legs.  Lymphedema life impact score indicates a 65% impairment.  Her eval level is moderate due to her deafness and bony metastases; condition is evolving with treatment ongoing with chemotherapy.   Rehab Potential Good   Clinical Impairments Affecting Rehab Potential bony metastases   PT Frequency --  3 total visits   PT Duration 6 weeks   PT Treatment/Interventions ADLs/Self Care Home Management;DME Instruction;Therapeutic exercise;Patient/family education;Orthotic Fit/Training;Manual techniques;Manual lymph drainage;Therapeutic activities   PT Next Visit Plan Begin instruction in  manual lymph drainage; educate about lymphedema risk reduction practices; discuss day- and nighttime garments further.   Recommended Other Services fitting with [facility]; pump demo with [employer]   Consulted and Agree with Plan of Care Patient      Patient will benefit from skilled therapeutic intervention in order to improve the following deficits and impairments:  Increased edema, Decreased knowledge of use of DME, Decreased knowledge of precautions, Pain  Visit Diagnosis: Lymphedema, not elsewhere classified - Plan: PT plan of care cert/re-cert  Pain in left leg - Plan: PT plan of care cert/re-cert      G-Codes - [id number]    Functional Assessment Tool Used lymphedema life impact score   Functional Limitation Self care   Self Care Current Status [id number]) At least 60 percent but less than 80 percent impaired, limited or restricted   Self Care Goal Status [id number]) At least 1 percent but less than 20 percent impaired, limited or restricted       Problem List Patient Active Problem List   Diagnosis Date Noted  . Osteopenia of multiple sites 11/26/2015  . Drug-induced diarrhea 11/26/2015  . Pain of left hip joint 11/08/2015  . Chemotherapy-induced peripheral neuropathy ([place]) 11/03/2015  . Port catheter in place 09/26/2015  . Chemotherapy induced nausea and vomiting 08/13/2015  . Cancer related pain 08/13/2015  . Portacath in place 08/13/2015  . Drug allergy, multiple 08/13/2015  . 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 Carcinoma of breast metastatic  to bone, right ([place]) 07/14/2015  . Other iron deficiency anemias 07/14/2015  . Claustrophobia 07/14/2015  . Status post laparoscopic cholecystectomy 07/14/2015  . S/P TRAM (transverse rectus abdominis muscle) flap breast reconstruction 07/14/2015  . Thyroid nodule 07/09/2015  . Constipation due to pain medication   . Metastasis from breast cancer ([place])   . Left hip pain    . Metastatic bone cancer ([place])   . Depression   . Generalized anxiety disorder   . Esophageal reflux   . Bony metastasis ([place]) 06/25/2015  . Breast cancer ([place]) 06/25/2015  . Genetic testing 04/04/2015  . Congenital deafness 02/26/2015  . Noncompliance with therapeutic plan 02/26/2015  . Hx of BSO (bilateral salpingo-oophorectomy) 02/26/2015  . Premature surgical menopause 02/26/2015  . Benign paroxysmal positional vertigo 07/24/2014  . Migraine 07/24/2014  . RLQ abdominal pain 09/30/2013  . Dysuria 09/30/2013  . Hx of breast cancer 04/28/2013  . Colon cancer screening 04/28/2013  . Migraine without aura, with intractable migraine, so stated, without mention of status migrainosus 06/22/2012  . Dizziness 05/14/2011  . Pharyngitis, acute 09/04/2010  . BACK STRAIN, LUMBAR 01/21/2010  . DE QUERVAIN'S TENOSYNOVITIS, LEFT WRIST 10/07/2009  . EMESIS 02/05/2009  . ABDOMINAL PAIN, GENERALIZED 02/05/2009  . DIZZINESS, CHRONIC 01/25/2007  . INSOMNIA 01/25/2007  . Hearing loss 03/11/2006  . CERVICAL DYSPLASIA 03/11/2006  . MENSTRUATION, PAINFUL 03/11/2006    SALISBURY,DONNA 11/27/2015, 9:52 PM  [place], [place], [address] Phone: [phone]   Fax:  [phone]  Name: [name] MRN: [id number] Date of Birth: [birth year]  Serafina Royals, PT 11/27/15 9:52 PM

## 2015-11-28 ENCOUNTER — Telehealth: Payer: Self-pay | Admitting: *Deleted

## 2015-11-28 NOTE — Telephone Encounter (Signed)
Patient called and left message.  She received a call about her appointment.  She is aware of her appt. Tomorrow so she does not understand the purpose of the call.  Called patient back and left message thru interpreter that there are automated calls for appts.  Let her know the times of her appt. Tomorrow and if there are further questions, please call us back.

## 2015-11-29 ENCOUNTER — Ambulatory Visit (HOSPITAL_BASED_OUTPATIENT_CLINIC_OR_DEPARTMENT_OTHER): Payer: Medicaid Other

## 2015-11-29 ENCOUNTER — Other Ambulatory Visit (HOSPITAL_BASED_OUTPATIENT_CLINIC_OR_DEPARTMENT_OTHER): Payer: Medicaid Other

## 2015-11-29 ENCOUNTER — Other Ambulatory Visit: Payer: Self-pay | Admitting: *Deleted

## 2015-11-29 ENCOUNTER — Ambulatory Visit: Payer: Medicaid Other

## 2015-11-29 VITALS — BP 107/61 | HR 89 | Temp 98.3°F | Resp 16

## 2015-11-29 DIAGNOSIS — C7951 Secondary malignant neoplasm of bone: Secondary | ICD-10-CM

## 2015-11-29 DIAGNOSIS — Z5112 Encounter for antineoplastic immunotherapy: Secondary | ICD-10-CM | POA: Diagnosis not present

## 2015-11-29 DIAGNOSIS — C50911 Malignant neoplasm of unspecified site of right female breast: Secondary | ICD-10-CM

## 2015-11-29 DIAGNOSIS — Z95828 Presence of other vascular implants and grafts: Secondary | ICD-10-CM

## 2015-11-29 DIAGNOSIS — Z5111 Encounter for antineoplastic chemotherapy: Secondary | ICD-10-CM

## 2015-11-29 DIAGNOSIS — M8589 Other specified disorders of bone density and structure, multiple sites: Secondary | ICD-10-CM

## 2015-11-29 DIAGNOSIS — C773 Secondary and unspecified malignant neoplasm of axilla and upper limb lymph nodes: Secondary | ICD-10-CM | POA: Diagnosis not present

## 2015-11-29 LAB — COMPREHENSIVE METABOLIC PANEL
ALBUMIN: 2.8 g/dL — AB (ref 3.5–5.0)
ALT: 8 U/L (ref 0–55)
Alkaline Phosphatase: 72 U/L (ref 40–150)
Anion Gap: 14 mEq/L — ABNORMAL HIGH (ref 3–11)
BUN: 6.4 mg/dL — AB (ref 7.0–26.0)
CHLORIDE: 106 meq/L (ref 98–109)
CO2: 22 mEq/L (ref 22–29)
Calcium: 8.8 mg/dL (ref 8.4–10.4)
Creatinine: 0.6 mg/dL (ref 0.6–1.1)
EGFR: >90 mL/min/{1.73_m2} (ref >90)
GLUCOSE: 133 mg/dL (ref 70–140)
POTASSIUM: 3.5 meq/L (ref 3.5–5.1)
SODIUM: 142 meq/L (ref 136–145)
Total Bilirubin: 0.44 mg/dL (ref 0.20–1.20)
Total Protein: 5.9 g/dL — ABNORMAL LOW (ref 6.4–8.3)

## 2015-11-29 LAB — CBC WITH DIFFERENTIAL/PLATELET
BASO%: 0.1 % (ref 0.0–2.0)
BASOS ABS: 0 10*3/uL (ref 0.0–0.1)
EOS%: 0 % (ref 0.0–7.0)
Eosinophils Absolute: 0 10*3/uL (ref 0.0–0.5)
HCT: 27 % — ABNORMAL LOW (ref 34.8–46.6)
HEMOGLOBIN: 8.9 g/dL — AB (ref 11.6–15.9)
LYMPH%: 8.2 % — ABNORMAL LOW (ref 14.0–49.7)
MCH: 30.5 pg (ref 25.1–34.0)
MCHC: 33.1 g/dL (ref 31.5–36.0)
MCV: 92.3 fL (ref 79.5–101.0)
MONO#: 0.2 10*3/uL (ref 0.1–0.9)
MONO%: 3 % (ref 0.0–14.0)
NEUT#: 5.8 10*3/uL (ref 1.5–6.5)
NEUT%: 88.7 % — ABNORMAL HIGH (ref 38.4–76.8)
Platelets: 290 10*3/uL (ref 145–400)
RBC: 2.93 10*6/uL — ABNORMAL LOW (ref 3.70–5.45)
RDW: 15.5 % — AB (ref 11.2–14.5)
WBC: 6.6 10*3/uL (ref 3.9–10.3)
lymph#: 0.5 10*3/uL — ABNORMAL LOW (ref 0.9–3.3)

## 2015-11-29 MED ORDER — LORAZEPAM 2 MG/ML IJ SOLN
INTRAMUSCULAR | Status: AC
  Filled 2015-11-29: qty 1

## 2015-11-29 MED ORDER — SODIUM CHLORIDE 0.9 % IV SOLN
Freq: Once | INTRAVENOUS | Status: AC
  Administered 2015-11-29: 13:00:00 via INTRAVENOUS

## 2015-11-29 MED ORDER — PEGFILGRASTIM 6 MG/0.6ML ~~LOC~~ PSKT
6.0000 mg | PREFILLED_SYRINGE | Freq: Once | SUBCUTANEOUS | Status: AC
  Administered 2015-11-29: 6 mg via SUBCUTANEOUS
  Filled 2015-11-29: qty 0.6

## 2015-11-29 MED ORDER — LORATADINE 10 MG PO TABS
10.0000 mg | ORAL_TABLET | Freq: Every day | ORAL | Status: DC
  Administered 2015-11-29: 10 mg via ORAL
  Filled 2015-11-29: qty 1

## 2015-11-29 MED ORDER — ONDANSETRON HCL 8 MG PO TABS
ORAL_TABLET | ORAL | Status: AC
  Filled 2015-11-29: qty 1

## 2015-11-29 MED ORDER — ACETAMINOPHEN 325 MG PO TABS
650.0000 mg | ORAL_TABLET | Freq: Once | ORAL | Status: AC
  Administered 2015-11-29: 650 mg via ORAL

## 2015-11-29 MED ORDER — SODIUM CHLORIDE 0.9% FLUSH
10.0000 mL | INTRAVENOUS | Status: DC | PRN
  Administered 2015-11-29: 10 mL
  Filled 2015-11-29: qty 10

## 2015-11-29 MED ORDER — SODIUM CHLORIDE 0.9 % IV SOLN: Freq: Once | INTRAVENOUS | Status: DC

## 2015-11-29 MED ORDER — DOCETAXEL CHEMO INJECTION 160 MG/16ML
50.0000 mg/m2 | Freq: Once | INTRAVENOUS | Status: AC
  Administered 2015-11-29: 80 mg via INTRAVENOUS
  Filled 2015-11-29: qty 8

## 2015-11-29 MED ORDER — LORAZEPAM 2 MG/ML IJ SOLN
1.0000 mg | Freq: Once | INTRAMUSCULAR | Status: AC | PRN
Start: 2015-11-29 — End: 2015-11-29
  Administered 2015-11-29: 1 mg via INTRAVENOUS

## 2015-11-29 MED ORDER — ACETAMINOPHEN 325 MG PO TABS
ORAL_TABLET | ORAL | Status: AC
  Filled 2015-11-29: qty 2

## 2015-11-29 MED ORDER — SODIUM CHLORIDE 0.9 % IV SOLN
210.0000 mg | Freq: Once | INTRAVENOUS | Status: AC
  Administered 2015-11-29: 210 mg via INTRAVENOUS
  Filled 2015-11-29: qty 7

## 2015-11-29 MED ORDER — ONDANSETRON HCL 8 MG PO TABS
8.0000 mg | ORAL_TABLET | Freq: Once | ORAL | Status: AC
  Administered 2015-11-29: 8 mg via ORAL

## 2015-11-29 MED ORDER — SODIUM CHLORIDE 0.9 % IJ SOLN
10.0000 mL | INTRAMUSCULAR | Status: DC | PRN
  Administered 2015-11-29: 10 mL via INTRAVENOUS
  Filled 2015-11-29: qty 10

## 2015-11-29 MED ORDER — TRASTUZUMAB CHEMO 150 MG IV SOLR
6.0000 mg/kg | Freq: Once | INTRAVENOUS | Status: AC
  Administered 2015-11-29: 357 mg via INTRAVENOUS
  Filled 2015-11-29: qty 17

## 2015-11-29 MED ORDER — SODIUM CHLORIDE 0.9 % IV SOLN
Freq: Once | INTRAVENOUS | Status: AC
  Administered 2015-11-29: 14:00:00 via INTRAVENOUS
  Filled 2015-11-29: qty 5

## 2015-11-29 MED ORDER — HEPARIN SOD (PORK) LOCK FLUSH 100 UNIT/ML IV SOLN
500.0000 [IU] | Freq: Once | INTRAVENOUS | Status: AC | PRN
  Administered 2015-11-29: 500 [IU]
  Filled 2015-11-29: qty 5

## 2015-11-29 NOTE — Patient Instructions (Signed)
[facility] Discharge Instructions for Patients Receiving Chemotherapy  Today you received the following chemotherapy agents:  Taxotere, Herceptin, Perjeta  To help prevent nausea and vomiting after your treatment, we encourage you to take your nausea medication as prescribed.   If you develop nausea and vomiting that is not controlled by your nausea medication, call the clinic.   BELOW ARE SYMPTOMS THAT SHOULD BE REPORTED IMMEDIATELY:  *FEVER GREATER THAN 100.5 F  *CHILLS WITH OR WITHOUT FEVER  NAUSEA AND VOMITING THAT IS NOT CONTROLLED WITH YOUR NAUSEA MEDICATION  *UNUSUAL SHORTNESS OF BREATH  *UNUSUAL BRUISING OR BLEEDING  TENDERNESS IN MOUTH AND THROAT WITH OR WITHOUT PRESENCE OF ULCERS  *URINARY PROBLEMS  *BOWEL PROBLEMS  UNUSUAL RASH Items with * indicate a potential emergency and should be followed up as soon as possible.  Feel free to call the clinic you have any questions or concerns. The clinic phone number is [phone].  Please show the [place] at check-in to the Emergency Department and triage nurse.

## 2015-11-29 NOTE — Progress Notes (Signed)
Pt has complaints of chronic L leg pain 8/10-10/10. Has stopped taking hydrocodone because pt states it does not work. Through conversation with Dr. Marko Plume and pt separately, conveyed to pt that she can take 2 hydrocodone every 6 hours PRN for pain. She states that she has already done that and it is not helping. MD does not want to add any additional medications at this time, and pt verbalized understanding. Pt verbalized, "I am not angry, I am just frustrated." Added hot packs and warm blankets to LLE and pt said, "That helps." Pt rested comfortably during chemotherapy treatment. Pt added, "I am not going to take my hydrocodone any more. I will deal with the pain if I can't get anything else. It doesn't work."  Confirmed for pt via Dr. Marko Plume that she can also take robitussin OTC for her cough, and it should not interact with any chemotherapy or pre-medications that we are administering here.

## 2015-11-30 ENCOUNTER — Ambulatory Visit (HOSPITAL_BASED_OUTPATIENT_CLINIC_OR_DEPARTMENT_OTHER): Payer: Medicaid Other

## 2015-11-30 VITALS — BP 104/50 | HR 74 | Temp 97.8°F | Resp 20

## 2015-11-30 DIAGNOSIS — C50911 Malignant neoplasm of unspecified site of right female breast: Secondary | ICD-10-CM

## 2015-11-30 DIAGNOSIS — C7951 Secondary malignant neoplasm of bone: Secondary | ICD-10-CM | POA: Diagnosis not present

## 2015-11-30 DIAGNOSIS — Z95828 Presence of other vascular implants and grafts: Secondary | ICD-10-CM

## 2015-11-30 LAB — VITAMIN D 25 HYDROXY (VIT D DEFICIENCY, FRACTURES): VIT D 25 HYDROXY: 16.8 ng/mL — AB (ref 30.0–100.0)

## 2015-11-30 MED ORDER — HEPARIN SOD (PORK) LOCK FLUSH 100 UNIT/ML IV SOLN
500.0000 [IU] | Freq: Once | INTRAVENOUS | Status: AC | PRN
  Administered 2015-11-30: 500 [IU] via INTRAVENOUS
  Filled 2015-11-30: qty 5

## 2015-11-30 MED ORDER — SODIUM CHLORIDE 0.9 % IV SOLN
Freq: Once | INTRAVENOUS | Status: AC
  Administered 2015-11-30: 10:00:00 via INTRAVENOUS

## 2015-11-30 MED ORDER — SODIUM CHLORIDE 0.9 % IJ SOLN
10.0000 mL | INTRAMUSCULAR | Status: DC | PRN
  Administered 2015-11-30: 10 mL via INTRAVENOUS
  Filled 2015-11-30: qty 10

## 2015-11-30 NOTE — Patient Instructions (Signed)
Dehydration, Adult Dehydration is when there is not enough fluid or water in your body. This happens when you lose more fluids than you take in. Dehydration can range from mild to very bad. It should be treated right away to keep it from getting very bad. Symptoms of mild dehydration may include:   Thirst.  Dry lips.  Slightly dry mouth.  Dry, warm skin.  Dizziness. Symptoms of moderate dehydration may include:   Very dry mouth.  Muscle cramps.  Dark pee (urine). Pee may be the color of tea.  Your body making less pee.  Your eyes making fewer tears.  Heartbeat that is uneven or faster than normal (palpitations).  Headache.  Light-headedness, especially when you stand up from sitting.  Fainting (syncope). Symptoms of very bad dehydration may include:   Changes in skin, such as:  Cold and clammy skin.  Blotchy (mottled) or pale skin.  Skin that does not quickly return to normal after being lightly pinched and let go (poor skin turgor).  Changes in body fluids, such as:  Feeling very thirsty.  Your eyes making fewer tears.  Not sweating when body temperature is high, such as in hot weather.  Your body making very little pee.  Changes in vital signs, such as:  Weak pulse.  Pulse that is more than 100 beats a minute when you are sitting still.  Fast breathing.  Low blood pressure.  Other changes, such as:  Sunken eyes.  Cold hands and feet.  Confusion.  Lack of energy (lethargy).  Trouble waking up from sleep.  Short-term weight loss.  Unconsciousness. Follow these instructions at home:  If told by your doctor, drink an ORS:  Make an ORS by using instructions on the package.  Start by drinking small amounts, about  cup (120 mL) every 5-10 minutes.  Slowly drink more until you have had the amount that your doctor said to have.  Drink enough clear fluid to keep your pee clear or pale yellow. If you were told to drink an ORS, finish the  ORS first, then start slowly drinking clear fluids. Drink fluids such as:  Water. Do not drink only water by itself. Doing that can make the salt (sodium) level in your body get too low (hyponatremia).  Ice chips.  Fruit juice that you have added water to (diluted).  Low-calorie sports drinks.  Avoid:  Alcohol.  Drinks that have a lot of sugar. These include high-calorie sports drinks, fruit juice that does not have water added, and soda.  Caffeine.  Foods that are greasy or have a lot of fat or sugar.  Take over-the-counter and prescription medicines only as told by your doctor.  Do not take salt tablets. Doing that can make the salt level in your body get too high (hypernatremia).  Eat foods that have minerals (electrolytes). Examples include bananas, oranges, potatoes, tomatoes, and spinach.  Keep all follow-up visits as told by your doctor. This is important. Contact a doctor if:  You have belly (abdominal) pain that:  Gets worse.  Stays in one area (localizes).  You have a rash.  You have a stiff neck.  You get angry or annoyed more easily than normal (irritability).  You are more sleepy than normal.  You have a harder time waking up than normal.  You feel:  Weak.  Dizzy.  Very thirsty.  You have peed (urinated) only a small amount of very dark pee during 6-8 hours. Get help right away if:  You   have symptoms of very bad dehydration.  You cannot drink fluids without throwing up (vomiting).  Your symptoms get worse with treatment.  You have a fever.  You have a very bad headache.  You are throwing up or having watery poop (diarrhea) and it:  Gets worse.  Does not go away.  You have blood or something green (bile) in your throw-up.  You have blood in your poop (stool). This may cause poop to look black and tarry.  You have not peed in 6-8 hours.  You pass out (faint).  Your heart rate when you are sitting still is more than 100 beats a  minute.  You have trouble breathing. This information is not intended to replace advice given to you by your health care provider. Make sure you discuss any questions you have with your health care provider. Document Released: 10/25/2008 Document Revised: 07/19/2015 Document Reviewed: 02/22/2015 Elsevier Interactive Patient Education  2017 Elsevier Inc.  

## 2015-12-02 ENCOUNTER — Ambulatory Visit: Payer: Medicaid Other

## 2015-12-02 ENCOUNTER — Ambulatory Visit (HOSPITAL_BASED_OUTPATIENT_CLINIC_OR_DEPARTMENT_OTHER): Payer: Medicaid Other

## 2015-12-02 VITALS — BP 93/50 | HR 72 | Temp 98.4°F | Resp 19

## 2015-12-02 DIAGNOSIS — C7951 Secondary malignant neoplasm of bone: Secondary | ICD-10-CM

## 2015-12-02 DIAGNOSIS — C773 Secondary and unspecified malignant neoplasm of axilla and upper limb lymph nodes: Secondary | ICD-10-CM

## 2015-12-02 DIAGNOSIS — C50911 Malignant neoplasm of unspecified site of right female breast: Secondary | ICD-10-CM | POA: Diagnosis present

## 2015-12-02 DIAGNOSIS — Z95828 Presence of other vascular implants and grafts: Secondary | ICD-10-CM

## 2015-12-02 MED ORDER — SODIUM CHLORIDE 0.9 % IV SOLN: Freq: Once | INTRAVENOUS | Status: DC

## 2015-12-02 MED ORDER — SODIUM CHLORIDE 0.9 % IV SOLN
INTRAVENOUS | Status: DC
  Administered 2015-12-02: 12:00:00 via INTRAVENOUS

## 2015-12-02 MED ORDER — SODIUM CHLORIDE 0.9 % IJ SOLN
10.0000 mL | INTRAMUSCULAR | Status: DC | PRN
  Filled 2015-12-02: qty 10

## 2015-12-02 MED ORDER — HEPARIN SOD (PORK) LOCK FLUSH 100 UNIT/ML IV SOLN
500.0000 [IU] | Freq: Once | INTRAVENOUS | Status: DC | PRN
  Filled 2015-12-02: qty 5

## 2015-12-02 MED ORDER — ONDANSETRON HCL 4 MG/2ML IJ SOLN
INTRAMUSCULAR | Status: AC
  Filled 2015-12-02: qty 4

## 2015-12-02 MED ORDER — ONDANSETRON HCL 4 MG/2ML IJ SOLN
8.0000 mg | Freq: Once | INTRAMUSCULAR | Status: AC
  Administered 2015-12-02: 8 mg via INTRAVENOUS

## 2015-12-02 NOTE — Patient Instructions (Signed)
Dehydration, Adult Dehydration is a condition in which there is not enough fluid or water in the body. This happens when you lose more fluids than you take in. Important organs, such as the kidneys, brain, and heart, cannot function without a proper amount of fluids. Any loss of fluids from the body can lead to dehydration. Dehydration can range from mild to severe. This condition should be treated right away to prevent it from becoming severe. What are the causes? This condition may be caused by:  Vomiting.  Diarrhea.  Excessive sweating, such as from heat exposure or exercise.  Not drinking enough fluid, especially:  When ill.  While doing activity that requires a lot of energy.  Excessive urination.  Fever.  Infection.  Certain medicines, such as medicines that cause the body to lose excess fluid (diuretics).  Inability to access safe drinking water.  Reduced physical ability to get adequate water and food. What increases the risk? This condition is more likely to develop in people:  Who have a poorly controlled long-term (chronic) illness, such as diabetes, heart disease, or kidney disease.  Who are age 65 or older.  Who are disabled.  Who live in a place with high altitude.  Who play endurance sports. What are the signs or symptoms? Symptoms of mild dehydration may include:   Thirst.  Dry lips.  Slightly dry mouth.  Dry, warm skin.  Dizziness. Symptoms of moderate dehydration may include:   Very dry mouth.  Muscle cramps.  Dark urine. Urine may be the color of tea.  Decreased urine production.  Decreased tear production.  Heartbeat that is irregular or faster than normal (palpitations).  Headache.  Light-headedness, especially when you stand up from a sitting position.  Fainting (syncope). Symptoms of severe dehydration may include:   Changes in skin, such as:  Cold and clammy skin.  Blotchy (mottled) or pale skin.  Skin that does  not quickly return to normal after being lightly pinched and released (poor skin turgor).  Changes in body fluids, such as:  Extreme thirst.  No tear production.  Inability to sweat when body temperature is high, such as in hot weather.  Very little urine production.  Changes in vital signs, such as:  Weak pulse.  Pulse that is more than 100 beats a minute when sitting still.  Rapid breathing.  Low blood pressure.  Other changes, such as:  Sunken eyes.  Cold hands and feet.  Confusion.  Lack of energy (lethargy).  Difficulty waking up from sleep.  Short-term weight loss.  Unconsciousness. How is this diagnosed? This condition is diagnosed based on your symptoms and a physical exam. Blood and urine tests may be done to help confirm the diagnosis. How is this treated? Treatment for this condition depends on the severity. Mild or moderate dehydration can often be treated at home. Treatment should be started right away. Do not wait until dehydration becomes severe. Severe dehydration is an emergency and it needs to be treated in a hospital. Treatment for mild dehydration may include:   Drinking more fluids.  Replacing salts and minerals in your blood (electrolytes) that you may have lost. Treatment for moderate dehydration may include:   Drinking an oral rehydration solution (ORS). This is a drink that helps you replace fluids and electrolytes (rehydrate). It can be found at pharmacies and retail stores. Treatment for severe dehydration may include:   Receiving fluids through an IV tube.  Receiving an electrolyte solution through a feeding tube that is   passed through your nose and into your stomach (nasogastric tube, or NG tube).  Correcting any abnormalities in electrolytes.  Treating the underlying cause of dehydration. Follow these instructions at home:  If directed by your health care provider, drink an ORS:  Make an ORS by following instructions on the  package.  Start by drinking small amounts, about  cup (120 mL) every 5-10 minutes.  Slowly increase how much you drink until you have taken the amount recommended by your health care provider.  Drink enough clear fluid to keep your urine clear or pale yellow. If you were told to drink an ORS, finish the ORS first, then start slowly drinking other clear fluids. Drink fluids such as:  Water. Do not drink only water. Doing that can lead to having too little salt (sodium) in the body (hyponatremia).  Ice chips.  Fruit juice that you have added water to (diluted fruit juice).  Low-calorie sports drinks.  Avoid:  Alcohol.  Drinks that contain a lot of sugar. These include high-calorie sports drinks, fruit juice that is not diluted, and soda.  Caffeine.  Foods that are greasy or contain a lot of fat or sugar.  Take over-the-counter and prescription medicines only as told by your health care provider.  Do not take sodium tablets. This can lead to having too much sodium in the body (hypernatremia).  Eat foods that contain a healthy balance of electrolytes, such as bananas, oranges, potatoes, tomatoes, and spinach.  Keep all follow-up visits as told by your health care provider. This is important. Contact a health care provider if:  You have abdominal pain that:  Gets worse.  Stays in one area (localizes).  You have a rash.  You have a stiff neck.  You are more irritable than usual.  You are sleepier or more difficult to wake up than usual.  You feel weak or dizzy.  You feel very thirsty.  You have urinated only a small amount of very dark urine over 6-8 hours. Get help right away if:  You have symptoms of severe dehydration.  You cannot drink fluids without vomiting.  Your symptoms get worse with treatment.  You have a fever.  You have a severe headache.  You have vomiting or diarrhea that:  Gets worse.  Does not go away.  You have blood or green matter  (bile) in your vomit.  You have blood in your stool. This may cause stool to look black and tarry.  You have not urinated in 6-8 hours.  You faint.  Your heart rate while sitting still is over 100 beats a minute.  You have trouble breathing. This information is not intended to replace advice given to you by your health care provider. Make sure you discuss any questions you have with your health care provider. Document Released: 12/29/2004 Document Revised: 07/26/2015 Document Reviewed: 02/22/2015 Elsevier Interactive Patient Education  2017 Elsevier Inc.  

## 2015-12-03 ENCOUNTER — Telehealth: Payer: Self-pay

## 2015-12-03 NOTE — Telephone Encounter (Signed)
-----   Message from Gordy Levan, MD sent at 12/03/2015  8:04 AM EST ----- Labs seen and need follow up: needs to start vit D3  2000 units daily. If unable to reach her, will need to be sure she is aware at next visit on 11-27   thanks

## 2015-12-03 NOTE — Telephone Encounter (Signed)
Told [name] the results of the Vitamin D level and Dr. Mariana Kaufman  Recommendations for supplementation as noted below.  [name] stated that she can not swallow large pills.  That is why she does not take vitamins.  She stated she would  discuss with Dr. Marko Plume  On Monday 12-09-15 at visit.

## 2015-12-03 NOTE — Telephone Encounter (Signed)
Called [facility] at [facility] and inquired about the sizes of Vitamin D tablets.  The pharmacist stated that there is a soft gel OTC [facility] brand that is 2000 units  That does not seem too big. The prescription capsule of 50,000 units is a small soft gel. Her insurance will not cover prescription or OTC.   The cost shout be ~ $10.00 per month or less.

## 2015-12-06 ENCOUNTER — Telehealth: Payer: Self-pay

## 2015-12-06 NOTE — Telephone Encounter (Signed)
Faxed order for Compression device and lower extremity garment for treatment for LLE Lymphedema dated 12-06-15.   Sent a copy to HIM to be scanned into patient's EMR.

## 2015-12-08 ENCOUNTER — Other Ambulatory Visit: Payer: Self-pay | Admitting: Oncology

## 2015-12-08 DIAGNOSIS — C7951 Secondary malignant neoplasm of bone: Principal | ICD-10-CM

## 2015-12-08 DIAGNOSIS — Z79899 Other long term (current) drug therapy: Secondary | ICD-10-CM

## 2015-12-08 DIAGNOSIS — C799 Secondary malignant neoplasm of unspecified site: Secondary | ICD-10-CM

## 2015-12-08 DIAGNOSIS — C50911 Malignant neoplasm of unspecified site of right female breast: Secondary | ICD-10-CM

## 2015-12-08 DIAGNOSIS — C50919 Malignant neoplasm of unspecified site of unspecified female breast: Secondary | ICD-10-CM

## 2015-12-09 ENCOUNTER — Ambulatory Visit (HOSPITAL_BASED_OUTPATIENT_CLINIC_OR_DEPARTMENT_OTHER): Payer: Medicaid Other | Admitting: Oncology

## 2015-12-09 ENCOUNTER — Telehealth: Payer: Self-pay | Admitting: Oncology

## 2015-12-09 ENCOUNTER — Encounter: Payer: Self-pay | Admitting: Oncology

## 2015-12-09 ENCOUNTER — Other Ambulatory Visit (HOSPITAL_BASED_OUTPATIENT_CLINIC_OR_DEPARTMENT_OTHER): Payer: Medicaid Other

## 2015-12-09 ENCOUNTER — Ambulatory Visit (HOSPITAL_BASED_OUTPATIENT_CLINIC_OR_DEPARTMENT_OTHER): Payer: Medicaid Other

## 2015-12-09 VITALS — BP 108/59 | HR 79 | Temp 97.7°F | Resp 18 | Ht 67.0 in | Wt 124.0 lb

## 2015-12-09 DIAGNOSIS — E559 Vitamin D deficiency, unspecified: Secondary | ICD-10-CM

## 2015-12-09 DIAGNOSIS — C7951 Secondary malignant neoplasm of bone: Secondary | ICD-10-CM

## 2015-12-09 DIAGNOSIS — Z95828 Presence of other vascular implants and grafts: Secondary | ICD-10-CM

## 2015-12-09 DIAGNOSIS — C50911 Malignant neoplasm of unspecified site of right female breast: Secondary | ICD-10-CM

## 2015-12-09 DIAGNOSIS — D6481 Anemia due to antineoplastic chemotherapy: Secondary | ICD-10-CM

## 2015-12-09 DIAGNOSIS — T451X5A Adverse effect of antineoplastic and immunosuppressive drugs, initial encounter: Secondary | ICD-10-CM

## 2015-12-09 DIAGNOSIS — Z853 Personal history of malignant neoplasm of breast: Secondary | ICD-10-CM

## 2015-12-09 DIAGNOSIS — C773 Secondary and unspecified malignant neoplasm of axilla and upper limb lymph nodes: Secondary | ICD-10-CM

## 2015-12-09 DIAGNOSIS — I890 Lymphedema, not elsewhere classified: Secondary | ICD-10-CM

## 2015-12-09 LAB — COMPREHENSIVE METABOLIC PANEL
ALT: 9 U/L (ref 0–55)
ANION GAP: 8 meq/L (ref 3–11)
AST: 9 U/L (ref 5–34)
Albumin: 2.7 g/dL — ABNORMAL LOW (ref 3.5–5.0)
Alkaline Phosphatase: 116 U/L (ref 40–150)
BUN: 5.1 mg/dL — ABNORMAL LOW (ref 7.0–26.0)
CHLORIDE: 107 meq/L (ref 98–109)
CO2: 25 meq/L (ref 22–29)
Calcium: 8.2 mg/dL — ABNORMAL LOW (ref 8.4–10.4)
Creatinine: 0.7 mg/dL (ref 0.6–1.1)
Glucose: 91 mg/dl (ref 70–140)
POTASSIUM: 3.5 meq/L (ref 3.5–5.1)
Sodium: 140 mEq/L (ref 136–145)
Total Bilirubin: 0.45 mg/dL (ref 0.20–1.20)
Total Protein: 5.5 g/dL — ABNORMAL LOW (ref 6.4–8.3)

## 2015-12-09 LAB — CBC WITH DIFFERENTIAL/PLATELET
BASO%: 0.2 % (ref 0.0–2.0)
BASOS ABS: 0 10*3/uL (ref 0.0–0.1)
EOS%: 0.1 % (ref 0.0–7.0)
Eosinophils Absolute: 0 10*3/uL (ref 0.0–0.5)
HCT: 29 % — ABNORMAL LOW (ref 34.8–46.6)
HEMOGLOBIN: 9.3 g/dL — AB (ref 11.6–15.9)
LYMPH#: 1.4 10*3/uL (ref 0.9–3.3)
LYMPH%: 9.9 % — ABNORMAL LOW (ref 14.0–49.7)
MCH: 29.7 pg (ref 25.1–34.0)
MCHC: 32.1 g/dL (ref 31.5–36.0)
MCV: 92.4 fL (ref 79.5–101.0)
MONO#: 0.5 10*3/uL (ref 0.1–0.9)
MONO%: 3.5 % (ref 0.0–14.0)
NEUT#: 12.1 10*3/uL — ABNORMAL HIGH (ref 1.5–6.5)
NEUT%: 86.3 % — AB (ref 38.4–76.8)
Platelets: 213 10*3/uL (ref 145–400)
RBC: 3.13 10*6/uL — AB (ref 3.70–5.45)
RDW: 15.8 % — AB (ref 11.2–14.5)
WBC: 14.1 10*3/uL — ABNORMAL HIGH (ref 3.9–10.3)

## 2015-12-09 MED ORDER — SODIUM CHLORIDE 0.9 % IJ SOLN
10.0000 mL | INTRAMUSCULAR | Status: DC | PRN
  Administered 2015-12-09: 10 mL via INTRAVENOUS
  Filled 2015-12-09: qty 10

## 2015-12-09 MED ORDER — AZITHROMYCIN 1 G PO PACK: PACK | ORAL | 0 refills | Status: DC

## 2015-12-09 MED ORDER — HEPARIN SOD (PORK) LOCK FLUSH 100 UNIT/ML IV SOLN
500.0000 [IU] | Freq: Once | INTRAVENOUS | Status: AC | PRN
  Administered 2015-12-09: 500 [IU] via INTRAVENOUS
  Filled 2015-12-09: qty 5

## 2015-12-09 NOTE — Progress Notes (Signed)
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 She has now had 6 cycles of taxotere, herceptin perjeta from 08-09-15 thru 11-29-15, with dose reductions in Perjeta due to diarrhea and in taxotere cycle 6 due to fluid retention. She has had IV bisphosphonate monthly, most recently zometa 11-18-15. Last echocardiogram 11-07-15 had EF 55-60%. She is post rodding of impending left femur fracture 06-26-15 and radiation there thru 07-26-15. Plan is to repeat imaging and change to aromatase inhibitor with herceptin/ perjeta if improved. NOTE her bone disease is difficult to image on plain films or CT, is visible on MRI and PET.  She will need echocardiogram again in Jan.  Patient seems to have tolerated cycle 6 chemo better than prior cycles, likely with the decrease in taxotere and with additional IVF after treatment.  She continues to complain of swelling and pain left thigh, no DVT and no new concerns from orthopedic standpoint. She has hydrocodone available at home, tho she is not using it. She requested IV pain medication at time of last chemo, which was not clearly indicated / not given, and pain improved with warm blanket to the left thigh then. She has begun lymphedema physical therapy, next visit there 11-28 and compression garment ordered 12-06-15. She does not have pedal edema or edema RLE, no increased SOB, no other obvious fluid retention. She has had less nausea, has been able to eat and to drink fluids,  and does not report diarrhea now.  Patient has had nasal congestion with drainage mostly clear, and cough productive of clear sputum for at least 10 days, no fever, no  ear pain, not SOB. She is using Robitussin only, not helpful. She has not tried claritin that she has available, does not tolerate benadryl with restless legs so has been cautioned not to use OTC with benadryl.  No bleeding. Bladder ok. No new or different pain. No problems with PAC   Remainder of 10 point Review of Systems negative  PAC by IR 08-06-15 Feraheme 06-29-15 and 07-22-15 Genetics testing negative by Breast Ovarian panel 03-2015 [facility] 03-15-15 heterogeneously dense tissue otherwise negative. CA [id number] on 06-26-15 was 72.5 and 102 in early 08-2015, shortly after beginning taxotere herceptin perjeta Flu vaccine 10-14-15 Echocardiogram 11-07-15 ONCOLOGIC HISTORY Patient had 1.4 cm poorly differentiated right breast carcinoma diagnosed in Nov 1999 at age 50, 1/7 axillary nodes involved, ER/PR and HER 2 positive. She had mastectomy with the 7 axillary node evaluation, 4 cycles of adriamycin/ cytoxan followed by taxotere, then five years of tamoxifen thru June 2005 (no herceptin in 1999). She did not have local radiation. She had right TRAM reconstruction. She had bilateral salpingoophorectomy in May 2005. She was briefly on aromatase inhibitor after tamoxifen, but discontinued this herself due to poor tolerance. Most recent left mammogram was done as screening on 04-15-2012 with heterogeneously dense tissue and questioned asymmetry, followed by diagnostic left mamogram on 04-28-12 which had no findings of concern. 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 She had nailing of impending left femur fracture by Dr [name] on 06-26-15. Pathology [phone] from 06-26-15 showed metastatic  adenocarcinoma , strongly ER positive 95%, PR positive 2%, HER 2 positive 3+. Plain xrays suggested T1 and left 4th rib involvement, tho these also did not show on bone scan 07-01-15; like  unremarkable CT, bone scan showed only activity in left femur area of surgery. PET 07-19-15 showed extensive bone involvement axial and proximal appendicular skeleton, "severe multifocal osseous metastatic disease, much greater than anticipated based on prior imaging", involved nodes right subpectoral and right internal mammary, and no distant extraosseous mets.  She had radiation to left femur 30 cGy completed 07-26-15. She continued IV bisphosphonate with zometa on 08-07-15 and had first taxotere herceptin perjeta on 08-09-15. Perjeta held cycle 3 after diarrhea cycles 1 and 2. Perjeta resumed at 50% dosing cycle 4. Taxotere dose decreased cycle 6 due to concerns of fluid retention LLE.  Objective:  Vital signs in last 24 hours:  BP (!) 108/59 (BP Location: Left Arm, Patient Position: Sitting)   Pulse 79   Temp 97.7 F (36.5 C) (Oral)   Resp 18   Ht _0  (1.702 m)   Wt 124 lb (56.2 kg)   SpO2 100%   BMI 19.42 kg/m   Alert, oriented and appropriate. Ambulatory without assistance difficulty.  Alopecia  HEENT:PERRL, sclerae not icteric. Oral mucosa moist without lesions, posterior pharynx clear.  Neck supple. No JVD.  Lymphatics:no cervical,suraclavicular, axillary or inguinal adenopathy Resp: clear to auscultation bilaterally and normal percussion bilaterally Cardio: regular rate and rhythm. No gallop. GI: soft, nontender, not distended, no mass or organomegaly. Normally active bowel sounds. Surgical incision not remarkable. Musculoskeletal/ Extremities: without pitting edema, cords, tenderness Neuro: no peripheral neuropathy. Otherwise nonfocal Skin without rash, ecchymosis, petechiae Breasts: without dominant mass, skin or nipple findings. Axillae benign. Portacath-without erythema or tenderness  Lab Results:  Results for orders placed or performed in visit on 12/09/15  CBC with Differential  Result Value Ref Range   WBC 14.1 (H) 3.9 - 10.3 10e3/uL   NEUT# 12.1 (H) 1.5 - 6.5  10e3/uL   HGB 9.3 (L) 11.6 - 15.9 g/dL   HCT 29.0 (L) 34.8 - 46.6 %   Platelets 213 145 - 400 10e3/uL   MCV 92.4 79.5 - 101.0 fL   MCH 29.7 25.1 - 34.0 pg   MCHC 32.1 31.5 - 36.0 g/dL   RBC 3.13 (L) 3.70 - 5.45 10e6/uL   RDW 15.8 (H) 11.2 - 14.5 %   lymph# 1.4 0.9 - 3.3 10e3/uL   MONO# 0.5 0.1 - 0.9 10e3/uL   Eosinophils Absolute 0.0 0.0 - 0.5 10e3/uL   Basophils Absolute 0.0 0.0 - 0.1 10e3/uL   NEUT% 86.3 (H) 38.4 - 76.8 %   LYMPH% 9.9 (L) 14.0 - 49.7 %   MONO% 3.5 0.0 - 14.0 %   EOS% 0.1 0.0 - 7.0 %   BASO% 0.2 0.0 - 2.0 %  Comprehensive metabolic panel  Result Value Ref Range   Sodium 140 136 - 145 mEq/L   Potassium 3.5 3.5 - 5.1 mEq/L   Chloride 107 98 - 109 mEq/L   CO2 25 22 - 29 mEq/L   Glucose 91 70 - 140 mg/dl   BUN 5.1 (L) 7.0 - 26.0 mg/dL   Creatinine 0.7 0.6 - 1.1 mg/dL   Total Bilirubin 0.45 0.20 - 1.20 mg/dL   Alkaline Phosphatase 116 40 - 150 U/L   AST 9 5 - 34 U/L   ALT 9 0 - 55 U/L   Total Protein 5.5 (L) 6.4 - 8.3 g/dL   Albumin  2.7 (L) 3.5 - 5.0 g/dL   Calcium 8.2 (L) 8.4 - 10.4 mg/dL   Anion Gap 8 3 - 11 mEq/L   EGFR >90 >90 ml/min/1.73 m2   VIt D level 16.8 on 11-29-15.  Studies/Results:  No results found.  Medications: I have reviewed the patient's current medications. Add Vit D3  2000 u daily, as patient feels that she will be able to swallow those OTC gel caps.   DISCUSSION Discussed low Vit D and supplement as above. She feels that she will be better able to keep up with daily dosing of the Vit D rather than weekly 50K units (also small gel tablet)   Encouraged her to keep appointments with lymphedema PT. Discussed importance of following their instructions for massage and use of compression garment.   Patient is asking again when [facility] can be removed. We have reviewed, as multiple times previously, that she will likely need ongoing treatment for the incurable metastatic breast cancer, so it will be best to keep PAC.   We have discussed plan  to look again with PET, then may be able to stop taxotere and begin aromatase inhibitor in addition to continuing herceptin and perjeta via PAC q 3 weeks. PET is not scheduled yet pending authorization.   Assessment/Plan:  1.Metastatic ER+, HER 2 + breast cancer extensively involving bone: history of T1N1 right breast carcinoma age 27, ER/PR +, HER 2 + inNov 1999, no HER 2 therapy in 1999. Post nailing of impending fracture left femur by Dr [name] 06-26-15 and localradiation. Herceptin, Perjeta and taxotere given x 6 cycles from 08-09-15 thru 11-29-15, with on pro neulasta; zometa every 4 weeks. Perjeta held cycle 3 due to diarrhea, resumedwith cycle 4 at 50% dosing. Marland Kitchen Keep Perjeta dose at 50%.  Plan restaging PET, then hopefully improved adequately to change from chemo to AI , with herceptin perjeta and bisphosphonate to continue. I mentioned xgeva, but will continue zometa for now as we have authorization for this. 2. Echocardiogram 08-05-15 with EF 60-65% and on 11-07-15 EF 55-60%, prior adriamycin <300 mg/m2, no chest radiation. 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 Husband also deaf.  4.increased pain and some swelling left thigh: venous doppler negative, nothing new per ortho, likely lymphedema, possibly aggravated by taxotere despite correct decadron used around that agent. Lymphedema PT and dose reduced cycle 6 taxotere. 5.post laparoscopic cholecystectomy 2004 6.right TRAM with original breast cancer 1999 7.premature surgical menopause, post BSO 2062fr pelvic pain and ovarian cysts, likely was also beneficial from standpoint of the breast cancer.  8..claustrophobia: required full sedation for hip MRI. 9. Marked iron deficiency anemia: feraheme given 06-29-15 and 07-22-15. Chemo anemia now, not symptomatic to require transfusion 10. PAC placed by IR 08-06-15. Peripheral IV access limited to LUE due to breast  cancer surgery/ radiation 11.Possible HA with zofran, tho we had previously thought this was not the case. Antiemetics unfortunately very limited by med intolerances, but did well cycle 3 with EMEND and zofran. Note she has refusedto use ativan SL, discussed. Note on depakote by neurology for HA 12.diarrhea fromperjeta,C diff negative. Less diarrhea with decreased dose of perjeta, usingimodium prn 13.has been given information on advance directives.  14..hypokalemia from severePerjeta diarrhea previously, resolved. 15. flu vaccine given 10-14-15 [facility] and HA, on depakote by neurology for HA (or neuropathy?). Note she does not tolerate lying on stomach due to  vertigo  17.osteopenic by bone density scan 03-2015: note premature menopause and plan for AI.  Vit D as noted. 18. Low Vitamin D: begin 2000 units D3 daily and follow. Note patient not always compliant and thinks she will do better with daily dosing vs weekly 50K  All questions answered and she knows to call if needed prior to next scheduled visit. I will see her ~ 12-7 with herceptin perjeta zometa that day, PET may or may not have been done by then. Time spent 30 min including >50% counseling and coordination of care.    Evlyn Clines, MD   12/09/2015, 6:08 PM

## 2015-12-09 NOTE — Telephone Encounter (Signed)
Gave patient avs report and appointments for December and January. No new orders per 11/27 los. Pet scan not on schedule at this time and patient aware central radiology will contact her.

## 2015-12-10 ENCOUNTER — Telehealth: Payer: Self-pay

## 2015-12-10 ENCOUNTER — Ambulatory Visit: Payer: Medicaid Other | Admitting: Physical Therapy

## 2015-12-10 NOTE — Telephone Encounter (Signed)
Ms Mauri too her dose of Azithromycin this am ~1100 with food.  She states that she has abdominal cramping and diarrhea.

## 2015-12-10 NOTE — Telephone Encounter (Signed)
Told her Dr. Marko Plume said to sip on warm beverages. She can apply a heating pad to abdomen. The ATB will work itself out of her GI track in ~2-3 hrs and abdominal cramps will subside. She can take 1/2 to 1 tablet of her pain medication if necessary. Ms Cutcher relayed through interpreter that she understood instructions. The ATB was a 1 time dose.

## 2015-12-11 DIAGNOSIS — T451X5A Adverse effect of antineoplastic and immunosuppressive drugs, initial encounter: Secondary | ICD-10-CM

## 2015-12-11 DIAGNOSIS — D6481 Anemia due to antineoplastic chemotherapy: Secondary | ICD-10-CM | POA: Insufficient documentation

## 2015-12-11 DIAGNOSIS — I890 Lymphedema, not elsewhere classified: Secondary | ICD-10-CM | POA: Insufficient documentation

## 2015-12-12 ENCOUNTER — Telehealth: Payer: Self-pay | Admitting: *Deleted

## 2015-12-12 ENCOUNTER — Encounter: Payer: Self-pay | Admitting: Oncology

## 2015-12-12 NOTE — Progress Notes (Signed)
Medical Oncology  Per Dr Burr Medico, prefers this MD to follow in Jan, then she will pick up ~ Feb after this MD. Appointment change sent to schedulers  L.Marko Plume, MD

## 2015-12-12 NOTE — Telephone Encounter (Signed)
"  This is [place] with [employer] calling about faxed request for prescription signed 12-06-2015, [place] Form and face to face records from the office for this patient.  We re-faxed request today, need these signed and returned today."  Requested fax to breast center POD 1 ext [phone].  Notified [facility] To look for these requests.

## 2015-12-13 ENCOUNTER — Telehealth: Payer: Self-pay

## 2015-12-13 NOTE — Telephone Encounter (Signed)
Faxed order and [place] [employer] request for prior approval forms to [employer]. This is for pneumatic compression device for left leg.

## 2015-12-15 ENCOUNTER — Other Ambulatory Visit: Payer: Self-pay | Admitting: Oncology

## 2015-12-15 DIAGNOSIS — C7951 Secondary malignant neoplasm of bone: Principal | ICD-10-CM

## 2015-12-15 DIAGNOSIS — C50911 Malignant neoplasm of unspecified site of right female breast: Secondary | ICD-10-CM

## 2015-12-16 ENCOUNTER — Telehealth: Payer: Self-pay

## 2015-12-16 NOTE — Telephone Encounter (Signed)
Scheduling gave pt 1st available for PET at [facility]. It it 12/13. Her appt with Dr Marko Plume lab/flush/MD/infusion is 12/7. Do we need to change this?

## 2015-12-17 ENCOUNTER — Other Ambulatory Visit: Payer: Self-pay | Admitting: Oncology

## 2015-12-17 NOTE — Telephone Encounter (Signed)
  [name] Female, 50 y.o., 11-16-1965 Weight:  124 lb (56.2 kg) Phone:  [phone] PCP:  [name], MD [place] [facility] Release  General MRN:  [id number] MyChart:  Active Next Appt:  12/19/2015 appointments  Received: Today  Message Contents  Gordy Levan, MD  Baruch Merl, RN        She is for treatment 12-7, so I guess keep my apt 12-7 also. We should do teaching for letrozole on 12-7, as I expect to start letrozole after 12-13 PET information available.   thanks

## 2015-12-19 ENCOUNTER — Other Ambulatory Visit: Payer: Self-pay

## 2015-12-19 ENCOUNTER — Ambulatory Visit: Payer: Medicaid Other

## 2015-12-19 ENCOUNTER — Ambulatory Visit (HOSPITAL_BASED_OUTPATIENT_CLINIC_OR_DEPARTMENT_OTHER): Payer: Medicaid Other

## 2015-12-19 ENCOUNTER — Ambulatory Visit (HOSPITAL_BASED_OUTPATIENT_CLINIC_OR_DEPARTMENT_OTHER): Payer: Medicaid Other | Admitting: Oncology

## 2015-12-19 VITALS — BP 119/60 | HR 87 | Temp 97.2°F | Resp 18 | Wt 134.3 lb

## 2015-12-19 DIAGNOSIS — C7951 Secondary malignant neoplasm of bone: Secondary | ICD-10-CM

## 2015-12-19 DIAGNOSIS — C50911 Malignant neoplasm of unspecified site of right female breast: Secondary | ICD-10-CM

## 2015-12-19 DIAGNOSIS — Z95828 Presence of other vascular implants and grafts: Secondary | ICD-10-CM

## 2015-12-19 DIAGNOSIS — E876 Hypokalemia: Secondary | ICD-10-CM

## 2015-12-19 DIAGNOSIS — I890 Lymphedema, not elsewhere classified: Secondary | ICD-10-CM

## 2015-12-19 DIAGNOSIS — M8589 Other specified disorders of bone density and structure, multiple sites: Secondary | ICD-10-CM

## 2015-12-19 DIAGNOSIS — Z5112 Encounter for antineoplastic immunotherapy: Secondary | ICD-10-CM

## 2015-12-19 DIAGNOSIS — M25552 Pain in left hip: Secondary | ICD-10-CM | POA: Diagnosis not present

## 2015-12-19 DIAGNOSIS — F40240 Claustrophobia: Secondary | ICD-10-CM

## 2015-12-19 DIAGNOSIS — H905 Unspecified sensorineural hearing loss: Secondary | ICD-10-CM

## 2015-12-19 DIAGNOSIS — M8580 Other specified disorders of bone density and structure, unspecified site: Secondary | ICD-10-CM

## 2015-12-19 LAB — CBC WITH DIFFERENTIAL/PLATELET
BASO%: 0.6 % (ref 0.0–2.0)
BASOS ABS: 0 10*3/uL (ref 0.0–0.1)
EOS ABS: 0 10*3/uL (ref 0.0–0.5)
EOS%: 0.7 % (ref 0.0–7.0)
HEMATOCRIT: 27.7 % — AB (ref 34.8–46.6)
HEMOGLOBIN: 9.1 g/dL — AB (ref 11.6–15.9)
LYMPH#: 0.8 10*3/uL — AB (ref 0.9–3.3)
LYMPH%: 19.8 % (ref 14.0–49.7)
MCH: 30.6 pg (ref 25.1–34.0)
MCHC: 32.9 g/dL (ref 31.5–36.0)
MCV: 93 fL (ref 79.5–101.0)
MONO#: 0.3 10*3/uL (ref 0.1–0.9)
MONO%: 8.4 % (ref 0.0–14.0)
NEUT#: 2.7 10*3/uL (ref 1.5–6.5)
NEUT%: 70.5 % (ref 38.4–76.8)
PLATELETS: 239 10*3/uL (ref 145–400)
RBC: 2.98 10*6/uL — ABNORMAL LOW (ref 3.70–5.45)
RDW: 16.7 % — AB (ref 11.2–14.5)
WBC: 3.8 10*3/uL — ABNORMAL LOW (ref 3.9–10.3)

## 2015-12-19 LAB — COMPREHENSIVE METABOLIC PANEL
ALBUMIN: 2.5 g/dL — AB (ref 3.5–5.0)
ALK PHOS: 62 U/L (ref 40–150)
ALT: 8 U/L (ref 0–55)
ANION GAP: 9 meq/L (ref 3–11)
AST: 10 U/L (ref 5–34)
BUN: 4.4 mg/dL — AB (ref 7.0–26.0)
CALCIUM: 8.5 mg/dL (ref 8.4–10.4)
CO2: 25 mEq/L (ref 22–29)
CREATININE: 0.6 mg/dL (ref 0.6–1.1)
Chloride: 108 mEq/L (ref 98–109)
EGFR: >90 mL/min/{1.73_m2} (ref >90)
Glucose: 101 mg/dl (ref 70–140)
POTASSIUM: 3.6 meq/L (ref 3.5–5.1)
Sodium: 142 mEq/L (ref 136–145)
Total Bilirubin: 0.5 mg/dL (ref 0.20–1.20)
Total Protein: 5.2 g/dL — ABNORMAL LOW (ref 6.4–8.3)

## 2015-12-19 MED ORDER — LORATADINE 10 MG PO TABS
10.0000 mg | ORAL_TABLET | Freq: Every day | ORAL | Status: DC
  Administered 2015-12-19: 10 mg via ORAL
  Filled 2015-12-19: qty 1

## 2015-12-19 MED ORDER — SODIUM CHLORIDE 0.9 % IV SOLN
Freq: Once | INTRAVENOUS | Status: AC
  Administered 2015-12-19: 16:00:00 via INTRAVENOUS

## 2015-12-19 MED ORDER — DIVALPROEX SODIUM 250 MG PO DR TAB: 250.0000 mg | DELAYED_RELEASE_TABLET | Freq: Every day | ORAL | 1 refills | Status: DC

## 2015-12-19 MED ORDER — ACETAMINOPHEN 325 MG PO TABS
650.0000 mg | ORAL_TABLET | Freq: Once | ORAL | Status: AC
  Administered 2015-12-19: 650 mg via ORAL

## 2015-12-19 MED ORDER — TRASTUZUMAB CHEMO 150 MG IV SOLR
6.0000 mg/kg | Freq: Once | INTRAVENOUS | Status: AC
  Administered 2015-12-19: 357 mg via INTRAVENOUS
  Filled 2015-12-19: qty 17

## 2015-12-19 MED ORDER — SODIUM CHLORIDE 0.9 % IV SOLN
210.0000 mg | Freq: Once | INTRAVENOUS | Status: AC
  Administered 2015-12-19: 210 mg via INTRAVENOUS
  Filled 2015-12-19: qty 7

## 2015-12-19 MED ORDER — ACETAMINOPHEN 325 MG PO TABS
ORAL_TABLET | ORAL | Status: AC
  Filled 2015-12-19: qty 2

## 2015-12-19 MED ORDER — SODIUM CHLORIDE 0.9% FLUSH
10.0000 mL | INTRAVENOUS | Status: DC | PRN
  Administered 2015-12-19: 10 mL
  Filled 2015-12-19: qty 10

## 2015-12-19 MED ORDER — HEPARIN SOD (PORK) LOCK FLUSH 100 UNIT/ML IV SOLN
500.0000 [IU] | Freq: Once | INTRAVENOUS | Status: DC | PRN
  Filled 2015-12-19: qty 5

## 2015-12-19 MED ORDER — ZOLEDRONIC ACID 4 MG/100ML IV SOLN
4.0000 mg | Freq: Once | INTRAVENOUS | Status: AC
  Administered 2015-12-19: 4 mg via INTRAVENOUS
  Filled 2015-12-19: qty 100

## 2015-12-19 MED ORDER — HEPARIN SOD (PORK) LOCK FLUSH 100 UNIT/ML IV SOLN
500.0000 [IU] | Freq: Once | INTRAVENOUS | Status: AC | PRN
  Administered 2015-12-19: 500 [IU]
  Filled 2015-12-19: qty 5

## 2015-12-19 MED ORDER — SODIUM CHLORIDE 0.9 % IJ SOLN
10.0000 mL | INTRAMUSCULAR | Status: DC | PRN
  Administered 2015-12-19: 10 mL via INTRAVENOUS
  Filled 2015-12-19: qty 10

## 2015-12-19 NOTE — Progress Notes (Signed)
OFFICE PROGRESS NOTE   December 19, 2015   Physicians: Gery Pray, Rodell Perna, Ryan Grunz/ Valetta Fuller [name]([facility]), C.Harraway Janelle Floor (neurology)  INTERVAL HISTORY:  Patient is seen, together with husband and sign language interpreter, in continuing attention to metastatic ER+ HER 2 + right breast cancer extensively involving bone. She had 6 cycles of taxotere with herceptin and perjeta from 08-09-15 thru 11-29-15; she has had IV bisphosphonate monthly since 06-2015, due again today. PET  Is pending 12-25-15,  for reevaluation post the 6 cycles of taxotere, with plan to change to aromatase inhibitor and continue herceptin, perjeta and bone treatment depending on PET results.   Bone density scan 03-2015  Osteopenia, lowest T score -1.8  Respiratory symptoms resolved with azithromycin. She has had only 1 lymphedema PT visit for LLE swelling, on 11-27-15, having missed an appointment due to respiratory illness, but is scheduled back to lymphedema PT on 12-24-15. Home compression device has been requested. Patient reports that only problem now is the LLE swelling, which causes difficulty ambulating and is uncomfortable (no DVT by venous doppler at onset). Otherwise appetite is now excellent, with no N/V. Bowels are moving regularly. No other pain. No SOB, no bleeding, energy better, no problems with PAC, no increased peripheral neuropathy. Fingernails are discolored from taxotere, not painful. No complaints of excessive lacrimation. Remainder of 10 point Review of Systems negative.   PAC by IR 08-06-15 Feraheme 06-29-15 and 07-22-15 Genetics testing negative by Breast Ovarian panel 03-2015 [facility] 03-15-15 heterogeneously dense tissue otherwise negative. CA 2729 on 06-26-15 was 72.5 and 102 in early 08-2015, shortly after beginning taxotere herceptin perjeta Flu vaccine 10-14-15 Echocardiogram 11-07-15  ONCOLOGIC HISTORY Patient had 1.4 cm poorly differentiated  right breast carcinoma diagnosed in Nov 1999 at age 50, 1/7 axillary nodes involved, ER/PR and HER 2 positive. She had mastectomy with the 7 axillary node evaluation, 4 cycles of adriamycin/ cytoxan followed by taxotere, then five years of tamoxifen thru June 2005 (no herceptin in 1999). She did not have local radiation. She had right TRAM reconstruction. She had bilateral salpingoophorectomy in May 2005. She was briefly on aromatase inhibitor after tamoxifen, but discontinued this herself due to poor tolerance. Most recent left mammogram was done as screening on 04-15-2012 with heterogeneously dense tissue and questioned asymmetry, followed by diagnostic left mamogram on 04-28-12 which had no findings of concern. 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 She had nailing of impending left femur fracture by Dr [name] on 06-26-15. Pathology [phone] from 06-26-15 showed metastatic adenocarcinoma , strongly ER positive 95%, PR positive 2%, HER 2 positive 3+. Plain xrays suggested T1 and left 4th rib involvement, tho these also did not show on bone scan 07-01-15; like unremarkable CT, bone scan showed only activity in left femur area of surgery. PET 07-19-15 showed extensive bone involvement axial and proximal appendicular skeleton, "severe multifocal osseous metastatic disease, much greater than anticipated based on prior imaging", involved nodes right subpectoral and right internal mammary, and no distant extraosseous mets.  She had radiation to left femur 30 cGy completed 07-26-15. She continued IV bisphosphonate with zometa on 08-07-15 and had first taxotere herceptin perjeta on 08-09-15. Perjeta held cycle 3 after diarrhea cycles 1 and 2. Perjeta resumed at 50% dosing cycle 4. Taxotere dose decreased cycle 6 due to concerns of fluid retention LLE.   Objective:  Vital  signs in last 24 hours:  BP  119/60 (BP Location: Left Arm, Patient Position: Sitting)   Pulse 87   Temp 97.2 F (36.2 C) (Oral)   Resp 18   Wt 134 lb 4.8 oz (60.9 kg)   SpO2 100%   BMI 21.03 kg/m   Alert, oriented and appropriate, looks comfortable seated, no cough. Communicates easily with signing  Ambulatory without assistance, some difficulty with LLE mobility.  Alopecia  HEENT:PERRL, sclerae not icteric. Oral mucosa moist without lesions, posterior pharynx clear.  Neck supple. No JVD.  Lymphatics:no cervical,supraclavicular adenopathy Resp: clear to auscultation bilaterally and normal percussion bilaterally Cardio: regular rate and rhythm. No gallop. GI: soft, nontender, not distended, no mass or organomegaly. Normally active bowel sounds.  Musculoskeletal/ Extremities: LLE with same swelling of entire extremity, otherwise  Extremities without pitting edema, cords, tenderness Neuro: no peripheral neuropathy hands. Congenital deafness otherwise nonfocal Skin without rash, ecchymosis, petechiae. Nail polish on. Portacath-without erythema or tenderness  Lab Results:  Results for orders placed or performed in visit on 12/19/15  CBC with Differential  Result Value Ref Range   WBC 3.8 (L) 3.9 - 10.3 10e3/uL   NEUT# 2.7 1.5 - 6.5 10e3/uL   HGB 9.1 (L) 11.6 - 15.9 g/dL   HCT 27.7 (L) 34.8 - 46.6 %   Platelets 239 145 - 400 10e3/uL   MCV 93.0 79.5 - 101.0 fL   MCH 30.6 25.1 - 34.0 pg   MCHC 32.9 31.5 - 36.0 g/dL   RBC 2.98 (L) 3.70 - 5.45 10e6/uL   RDW 16.7 (H) 11.2 - 14.5 %   lymph# 0.8 (L) 0.9 - 3.3 10e3/uL   MONO# 0.3 0.1 - 0.9 10e3/uL   Eosinophils Absolute 0.0 0.0 - 0.5 10e3/uL   Basophils Absolute 0.0 0.0 - 0.1 10e3/uL   NEUT% 70.5 38.4 - 76.8 %   LYMPH% 19.8 14.0 - 49.7 %   MONO% 8.4 0.0 - 14.0 %   EOS% 0.7 0.0 - 7.0 %   BASO% 0.6 0.0 - 2.0 %  Comprehensive metabolic panel  Result Value Ref Range   Sodium 142 136 - 145 mEq/L   Potassium 3.6 3.5 - 5.1 mEq/L   Chloride 108 98 - 109 mEq/L    CO2 25 22 - 29 mEq/L   Glucose 101 70 - 140 mg/dl   BUN 4.4 (L) 7.0 - 26.0 mg/dL   Creatinine 0.6 0.6 - 1.1 mg/dL   Total Bilirubin 0.50 0.20 - 1.20 mg/dL   Alkaline Phosphatase 62 40 - 150 U/L   AST 10 5 - 34 U/L   ALT 8 0 - 55 U/L   Total Protein 5.2 (L) 6.4 - 8.3 g/dL   Albumin 2.5 (L) 3.5 - 5.0 g/dL   Calcium 8.5 8.4 - 10.4 mg/dL   Anion Gap 9 3 - 11 mEq/L   EGFR >90 >90 ml/min/1.73 m2   CA 2729 available after visit is 59.8, this having been 78.8 on 11-18-15 and 93 on 10-07-15.   Studies/Results:  No results found.  PET scheduled for 12-25-15  Medications: I have reviewed the patient's current medications. She continues depakote 250 mg at hs prescribed by neurology for HA (?or neuropathy), however some unclear difficulty getting this medication refilled from neurology and no upcoming appointments with PCP. Script sent for #30 with 1 refill, tho this should be filled ongoing either by neurology or PCP.  She has started Vit D3, no difficulty swallowing the gel caps.  Zometa today as is is preauthorized.  May change to xgeva ongoing.  DISCUSSION Reviewed plan for PET, which could not be done prior to this visit. Anticipated plan to change taxotere to aromatase inhibitor after PET reviewed, as well as plan to continue herceptin and perjeta for now.  Written information given about letrozole. I have also explained mechanism of action of the letrozole, possible hot flashes, possible effects on bone density, possible arthralgias. Note she had adjuvant tamoxifen, which she did not tolerate well and DCd herself; she has not had AI.   Discussed continuing interventions from lymphedema PT for LLE swelling. I have not given additional pain medication now.    Assessment/Plan:  1.Metastatic ER+, HER 2 + breast cancer extensively involving bone: Post nailing of impending fracture left femur by Dr [name] 06-26-15 and localradiation. Herceptin, Perjeta and taxotere given x 6 cycles from  08-09-15 thru 11-29-15, with on pro neulasta; zometa every 4 weeks. Perjeta at 50% dosing due to diarrhea.  Restaging PET planned 12-25-15, then likely change from taxotere to AI , with herceptin perjeta and bone therapy ongoing including treatment today. 2. Echocardiogram 08-05-15 with EF 60-65% and on 11-07-15 EF 55-60%, prior adriamycin <300 mg/m2, no chest radiation. Will need to repeat echo q 3 months on HER2 therapy, next in Jan. 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 Husband also deaf.  4.increased pain and some swelling left thigh: venous doppler negative, nothing new per ortho, likely lymphedema, possibly aggravated by taxotere despite correct decadron used around that agent. Lymphedema PT begun. 5.post laparoscopic cholecystectomy 2004 6.right TRAM with original breast cancer 1999 7.premature surgical menopause, post BSO 2036fr pelvic pain and ovarian cysts, likely was also beneficial from standpoint of the breast cancer. Bone density scan 03-2015 osteopenia 8..claustrophobia: required full sedation for hip MRI. 9. Marked iron deficiency anemia: feraheme given 06-29-15 and 07-22-15.  10. PAC placed by IR 08-06-15. Peripheral IV access limited to LUE due to breast cancer surgery/ radiation 11.Possible HA with zofran, tho we had previously thought this was not the case. Antiemetics  limited by med intolerances.  12. flu vaccine given 10-14-15 [facility], on depakote by neurology for HA (or neuropathy?). Note she does not tolerate lying on stomach due to vertigo  14.osteopenic by bone density scan 03-2015:  premature menopause and plan for AI.  Low Vit D, now on daily supplement.   All questions answered. Herceptin, perjeta, zometa orders in place. Time spent 30 min including >50% counseling and coordination of care. Route PCP, cc Dr XErlinda Hongneurology   LEvlyn Clines MD   12/19/2015, 4:40 PM

## 2015-12-19 NOTE — Patient Instructions (Signed)
[facility] Discharge Instructions for Patients Receiving Chemotherapy  Today you received the following chemotherapy agent: Herceptin, Perjeta  To help prevent nausea and vomiting after your treatment, we encourage you to take your nausea medication as prescribed.   If you develop nausea and vomiting that is not controlled by your nausea medication, call the clinic.   BELOW ARE SYMPTOMS THAT SHOULD BE REPORTED IMMEDIATELY:  *FEVER GREATER THAN 100.5 F  *CHILLS WITH OR WITHOUT FEVER  NAUSEA AND VOMITING THAT IS NOT CONTROLLED WITH YOUR NAUSEA MEDICATION  *UNUSUAL SHORTNESS OF BREATH  *UNUSUAL BRUISING OR BLEEDING  TENDERNESS IN MOUTH AND THROAT WITH OR WITHOUT PRESENCE OF ULCERS  *URINARY PROBLEMS  *BOWEL PROBLEMS  UNUSUAL RASH Items with * indicate a potential emergency and should be followed up as soon as possible.  Feel free to call the clinic you have any questions or concerns. The clinic phone number is [phone].  Please show the [facility] at check-in to the Emergency Department and triage nurse. Zoledronic Acid injection (Hypercalcemia, Oncology) What is this medicine? ZOLEDRONIC ACID (ZOE le dron ik AS id) lowers the amount of calcium loss from bone. It is used to treat too much calcium in your blood from cancer. It is also used to prevent complications of cancer that has spread to the bone. This medicine may be used for other purposes; ask your health care provider or pharmacist if you have questions. COMMON BRAND NAME(S): Zometa What should I tell my health care provider before I take this medicine? They need to know if you have any of these conditions: -aspirin-sensitive asthma -cancer, especially if you are receiving medicines used to treat cancer -dental disease or wear dentures -infection -kidney disease -receiving corticosteroids like dexamethasone or prednisone -an unusual or allergic reaction to zoledronic acid, other medicines,  foods, dyes, or preservatives -pregnant or trying to get pregnant -breast-feeding How should I use this medicine? This medicine is for infusion into a vein. It is given by a health care professional in a hospital or clinic setting. Talk to your pediatrician regarding the use of this medicine in children. Special care may be needed. Overdosage: If you think you have taken too much of this medicine contact a poison control center or emergency room at once. NOTE: This medicine is only for you. Do not share this medicine with others. What if I miss a dose? It is important not to miss your dose. Call your doctor or health care professional if you are unable to keep an appointment. What may interact with this medicine? -certain antibiotics given by injection -NSAIDs, medicines for pain and inflammation, like ibuprofen or naproxen -some diuretics like bumetanide, furosemide -teriparatide -thalidomide This list may not describe all possible interactions. Give your health care provider a list of all the medicines, herbs, non-prescription drugs, or dietary supplements you use. Also tell them if you smoke, drink alcohol, or use illegal drugs. Some items may interact with your medicine. What should I watch for while using this medicine? Visit your doctor or health care professional for regular checkups. It may be some time before you see the benefit from this medicine. Do not stop taking your medicine unless your doctor tells you to. Your doctor may order blood tests or other tests to see how you are doing. Women should inform their doctor if they wish to become pregnant or think they might be pregnant. There is a potential for serious side effects to an unborn child. Talk to your  health care professional or pharmacist for more information. You should make sure that you get enough calcium and vitamin D while you are taking this medicine. Discuss the foods you eat and the vitamins you take with your health  care professional. Some people who take this medicine have severe bone, joint, and/or muscle pain. This medicine may also increase your risk for jaw problems or a broken thigh bone. Tell your doctor right away if you have severe pain in your jaw, bones, joints, or muscles. Tell your doctor if you have any pain that does not go away or that gets worse. Tell your dentist and dental surgeon that you are taking this medicine. You should not have major dental surgery while on this medicine. See your dentist to have a dental exam and fix any dental problems before starting this medicine. Take good care of your teeth while on this medicine. Make sure you see your dentist for regular follow-up appointments. What side effects may I notice from receiving this medicine? Side effects that you should report to your doctor or health care professional as soon as possible: -allergic reactions like skin rash, itching or hives, swelling of the face, lips, or tongue -anxiety, confusion, or depression -breathing problems -changes in vision -eye pain -feeling faint or lightheaded, falls -jaw pain, especially after dental work -mouth sores -muscle cramps, stiffness, or weakness -redness, blistering, peeling or loosening of the skin, including inside the mouth -trouble passing urine or change in the amount of urine Side effects that usually do not require medical attention (report to your doctor or health care professional if they continue or are bothersome): -bone, joint, or muscle pain -constipation -diarrhea -fever -hair loss -irritation at site where injected -loss of appetite -nausea, vomiting -stomach upset -trouble sleeping -trouble swallowing -weak or tired This list may not describe all possible side effects. Call your doctor for medical advice about side effects. You may report side effects to FDA at 1-800-FDA-1088. Where should I keep my medicine? This drug is given in a hospital or clinic and  will not be stored at home. NOTE: This sheet is a summary. It may not cover all possible information. If you have questions about this medicine, talk to your doctor, pharmacist, or health care provider.  2017 Elsevier/Gold Standard (2013-05-27 14:19:39)

## 2015-12-20 LAB — CANCER ANTIGEN 27.29: CA 27.29: 59.8 U/mL — ABNORMAL HIGH (ref 0.0–38.6)

## 2015-12-21 ENCOUNTER — Encounter: Payer: Self-pay | Admitting: Oncology

## 2015-12-23 MED ORDER — POTASSIUM CHLORIDE ER 10 MEQ PO TBCR: 20.0000 meq | EXTENDED_RELEASE_TABLET | Freq: Every day | ORAL | 1 refills | Status: DC

## 2015-12-24 ENCOUNTER — Encounter: Payer: Self-pay | Admitting: Physical Therapy

## 2015-12-24 ENCOUNTER — Ambulatory Visit: Payer: Medicaid Other | Attending: Oncology | Admitting: Physical Therapy

## 2015-12-24 DIAGNOSIS — M25652 Stiffness of left hip, not elsewhere classified: Secondary | ICD-10-CM | POA: Diagnosis present

## 2015-12-24 DIAGNOSIS — M25552 Pain in left hip: Secondary | ICD-10-CM | POA: Insufficient documentation

## 2015-12-24 DIAGNOSIS — I890 Lymphedema, not elsewhere classified: Secondary | ICD-10-CM

## 2015-12-24 DIAGNOSIS — R2689 Other abnormalities of gait and mobility: Secondary | ICD-10-CM | POA: Insufficient documentation

## 2015-12-24 DIAGNOSIS — M79605 Pain in left leg: Secondary | ICD-10-CM | POA: Diagnosis present

## 2015-12-24 NOTE — Therapy (Signed)
[place], [place], [address] Phone: [phone]   Fax:  [phone]  Physical Therapy Treatment  Patient Details  Name: [name] MRN: [id number] Date of Birth: [birth year] Referring Provider: Dr. Evlyn Clines  Encounter Date: 12/24/2015      PT End of Session - 12/24/15 1653    Visit Number 2   Number of Visits 4   Date for PT Re-Evaluation 01/12/16   Authorization Type Medicaid   PT Start Time 1600   PT Stop Time 1645   PT Time Calculation (min) 45 min   Activity Tolerance Patient tolerated treatment well   Behavior During Therapy [name] for tasks assessed/performed      Past Medical History:  Diagnosis Date  . Abnormal Pap smear   . Anxiety   . Breast cancer ([place])   . Cancer ([place]) 1999   breast-s/p mastectomy, chemo, rad  . CIN I (cervical intraepithelial neoplasia I) 2003   by colpo  . Congenital deafness   . Deaf   . Depression   . Radiation 07/12/15-07/26/15   left femur 30 Gy  . S/P bilateral oophorectomy     Past Surgical History:  Procedure Laterality Date  . ABDOMINAL HYSTERECTOMY    . INTRAMEDULLARY (IM) NAIL INTERTROCHANTERIC Left 06/26/2015   Procedure: LEFT BIOMET LONG AFFIXS NAIL;  Surgeon: [name], MD;  Location: [facility];  Service: Orthopedics;  Laterality: Left;  . IR GENERIC HISTORICAL  08/06/2015   IR US GUIDE VASC ACCESS LEFT 08/06/2015 Aletta Edouard, MD [facility]-INTERV RAD  . IR GENERIC HISTORICAL  08/06/2015   IR FLUORO GUIDE CV LINE LEFT 08/06/2015 Aletta Edouard, MD [facility]-INTERV RAD  . MASTECTOMY     right breast  . MASTECTOMY    . OVARIAN CYST REMOVAL    . RADIOLOGY WITH ANESTHESIA Left 06/25/2015   Procedure: MRI OF LEFT HIP WITH OR WITHOUT CONTRAST;  Surgeon: Medication Radiologist, MD;  Location: [facility];  Service: Radiology;  Laterality: Left;  DR. MCINTYRE/MRI  . TUBAL LIGATION      There were no vitals filed for this visit.      Subjective Assessment  - 12/24/15 1603    Subjective My leg has been so uncomfortable. I feel like I am going to fall. My leg feels like it is going numb down to my toes. I am supposed to get the pump in 2-3 days.    Pertinent History Had left leg pain; had an MRI and then they found I had cancer. They put a rod in my leg on 06/26/15 and was in the hospital 1 1/2 weeks.  Primary cancer was in right breast.  Right breast cancer originally in 1999 with chemotherapy and tamoxifen; she had mastectomy.  She though she had beaten the cancer and then it returned.  Had radiation treatment in July for the leg.  Will have her last chemo this Friday.     Patient Stated Goals stop the swelling in my leg and reduce that pain too   Pain Score 9    Pain Location Leg   Pain Orientation Left   Pain Descriptors / Indicators Heaviness;Numbness;Tightness   Pain Type Chronic pain                         [facility] Adult PT Treatment/Exercise - 12/24/15 0001      Manual Therapy   Manual Therapy Manual Lymphatic Drainage (MLD);Edema management   Edema Management educated  pt on lymphedema risk reduction practices   Manual Lymphatic Drainage (MLD) issued handout and educated pt how to perform self MLD for LLE- had pt demonstrate correct technique to stretch skin: 5 diaphragmatic breaths, left axilla, left axillo inguinal pathway, LLE working proximal to distal moving fluid towards pathway                PT Education - 12/24/15 1652    Education provided Yes   Education Details lymphedema risk reduction practices, self MLD to LLE, anatomy and physiology of lymphatic system, day time and night time garments   Person(s) Educated Patient   Methods Handout;Explanation;Demonstration   Comprehension Verbalized understanding                [facility] - 11/27/15 2135      CC Long Term Goal  #1   Title Patient will be knowledgeable about performing manual lymph drainage.   Time 4   Period Weeks    Status New     CC Long Term Goal  #2   Title Pt. will be knowledgeable about pump as well as day- and nighttime compression garments.   Time 4   Period Weeks   Status New     CC Long Term Goal  #3   Title lymphedema life impact score reduced to 30% impairment or less   Baseline 65% impairment (score of 44)   Time 4   Period Weeks   Status New            Plan - 12/24/15 1653    Clinical Impression Statement Patient was educated in self MLD for her LLE. She is very tender at her left foot and ankle secondary to swelling and demonstrates abnormal gait pattern secondary to swelling. Educated pt on lymphedema risk reduction practices and anatomy and physiology of lymphatic system. Explained to pt the need for both day time and night time garments for long term management. Pt had been using heating pad on her left lower extremity for an hour at her thigh, an hour at her leg and an hour at her foot per her doctor (per pt). Pt was educated to stop this because heat can worsen lymphedema.    Rehab Potential Good   Clinical Impairments Affecting Rehab Potential bony metastases   PT Duration 6 weeks   PT Treatment/Interventions ADLs/Self Care Home Management;DME Instruction;Therapeutic exercise;Patient/family education;Orthotic Fit/Training;Manual techniques;Manual lymph drainage;Therapeutic activities   PT Next Visit Plan assess for indep with MLD, assess if pt received compression pump and whether she was measured for compression garments   Consulted and Agree with Plan of Care Patient      Patient will benefit from skilled therapeutic intervention in order to improve the following deficits and impairments:  Increased edema, Decreased knowledge of use of DME, Decreased knowledge of precautions, Pain  Visit Diagnosis: Lymphedema, not elsewhere classified  Pain in left leg     Problem List Patient Active Problem List   Diagnosis Date Noted  . Anemia due to antineoplastic  chemotherapy 12/11/2015  . Lymphedema of left leg 12/11/2015  . Osteopenia of multiple sites 11/26/2015  . Drug-induced diarrhea 11/26/2015  . Pain of left hip joint 11/08/2015  . Chemotherapy-induced peripheral neuropathy ([place]) 11/03/2015  . Port catheter in place 09/26/2015  . Chemotherapy induced nausea and vomiting 08/13/2015  . Cancer related pain 08/13/2015  . Portacath in place 08/13/2015  . Drug allergy, multiple 08/13/2015  . HER2-positive carcinoma of right breast ([place]) 07/26/2015  .  [name], right ([place]) 07/14/2015  . Other iron deficiency anemias 07/14/2015  . Claustrophobia 07/14/2015  . Status post laparoscopic cholecystectomy 07/14/2015  . S/P TRAM (transverse rectus abdominis muscle) flap breast reconstruction 07/14/2015  . Thyroid nodule 07/09/2015  . Constipation due to pain medication   . Metastasis from breast cancer ([place])   . Left hip pain   . Metastatic bone cancer ([place])   . Depression   . Generalized anxiety disorder   . Esophageal reflux   . Bony metastasis ([place]) 06/25/2015  . Breast cancer ([place]) 06/25/2015  . Genetic testing 04/04/2015  . Congenital deafness 02/26/2015  . Noncompliance with therapeutic plan 02/26/2015  . Hx of BSO (bilateral salpingo-oophorectomy) 02/26/2015  . Premature surgical menopause 02/26/2015  . Benign paroxysmal positional vertigo 07/24/2014  . Migraine 07/24/2014  . RLQ abdominal pain 09/30/2013  . Dysuria 09/30/2013  . Hx of breast cancer 04/28/2013  . Colon cancer screening 04/28/2013  . Migraine without aura, with intractable migraine, so stated, without mention of status migrainosus 06/22/2012  . Dizziness 05/14/2011  . Pharyngitis, acute 09/04/2010  . BACK STRAIN, LUMBAR 01/21/2010  . DE QUERVAIN'S TENOSYNOVITIS, LEFT WRIST 10/07/2009  . EMESIS 02/05/2009  . ABDOMINAL PAIN, GENERALIZED 02/05/2009  . DIZZINESS, CHRONIC 01/25/2007  . INSOMNIA 01/25/2007  . Hearing loss 03/11/2006  .  CERVICAL DYSPLASIA 03/11/2006  . MENSTRUATION, PAINFUL 03/11/2006    Alexia Freestone 12/24/2015, 4:57 PM  [place], [place], [address] Phone: [phone]   Fax:  [phone]  Name: RAKEL JUNIO MRN: [id number] Date of Birth: March 14, 1965  Allyson Sabal, PT 12/24/15 4:58 PM

## 2015-12-24 NOTE — Patient Instructions (Signed)
Deep Effective Breath   Standing, sitting, or laying down place both hands on the belly. Take a deep breath IN, expanding the belly; then breath OUT, contracting the belly. Repeat __5__ times. Do __2-3__ sessions per day and before each self massage.  http://gt2.exer.us/866   Copyright  VHI. All rights reserved.  Inguinal Nodes to Axilla - Clear   On involved side, at armpit, make _5__ in-place circles. Then from hip proceed in sections to armpit with stationary circles or pumps _5_ times, this is your pathway. Do _1__ time per day.  Copyright  VHI. All rights reserved.  LEG: Knee to Hip - Clear   Pump up outer thigh of involved leg from knee to outer hip. Then do stationary circles from inner to outer thigh, then do outer thigh again. Next, interlace fingers behind knee IF ABLE and make in-place circles. Do _5_ times of each sequence.  Do _1__ time per day.  Copyright  VHI. All rights reserved.  LEG: Ankle to Hip Sweep   Hands on sides of ankle of involved leg, pump _5__ times up both sides of lower leg, then retrace steps up outer thigh to hip as before and back to pathway. Do _2-3_ times. Do __1_ time per day.  Copyright  VHI. All rights reserved.  FOOT: Dorsum of Foot and Toes Massage   One hand on top of foot make _5_ stationary circles or pumps, then either on top of toes or each individual toe do _5_ pumps. Then retrace all steps pumping back up both sides of lower leg, outer thigh, and then pathway. Finish with what you started with, _5_ circles at involved side arm pit. All _2-3_ times at each sequence. Do _1__ time per day.  Copyright  VHI. All rights reserved.    

## 2015-12-25 ENCOUNTER — Encounter (HOSPITAL_COMMUNITY): Payer: Self-pay | Admitting: Radiology

## 2015-12-25 ENCOUNTER — Other Ambulatory Visit: Payer: Self-pay

## 2015-12-25 ENCOUNTER — Encounter (HOSPITAL_COMMUNITY)
Admission: RE | Admit: 2015-12-25 | Discharge: 2015-12-25 | Disposition: A | Discharge status: Home / Self Care | Payer: Medicaid Other | Source: Ambulatory Visit | Attending: Oncology | Admitting: Oncology

## 2015-12-25 DIAGNOSIS — C50911 Malignant neoplasm of unspecified site of right female breast: Secondary | ICD-10-CM | POA: Diagnosis not present

## 2015-12-25 DIAGNOSIS — C7951 Secondary malignant neoplasm of bone: Secondary | ICD-10-CM | POA: Diagnosis present

## 2015-12-25 LAB — GLUCOSE, CAPILLARY: GLUCOSE-CAPILLARY: 81 mg/dL (ref 65–99)

## 2015-12-25 MED ORDER — FLUDEOXYGLUCOSE F - 18 (FDG) INJECTION
6.7400 | Freq: Once | INTRAVENOUS | Status: AC | PRN
  Administered 2015-12-25: 6.74 via INTRAVENOUS

## 2016-01-01 ENCOUNTER — Telehealth: Payer: Self-pay

## 2016-01-01 ENCOUNTER — Ambulatory Visit: Payer: Medicaid Other

## 2016-01-01 DIAGNOSIS — M79605 Pain in left leg: Secondary | ICD-10-CM

## 2016-01-01 DIAGNOSIS — M25552 Pain in left hip: Secondary | ICD-10-CM

## 2016-01-01 DIAGNOSIS — M25652 Stiffness of left hip, not elsewhere classified: Secondary | ICD-10-CM

## 2016-01-01 DIAGNOSIS — I890 Lymphedema, not elsewhere classified: Secondary | ICD-10-CM | POA: Diagnosis not present

## 2016-01-01 DIAGNOSIS — R2689 Other abnormalities of gait and mobility: Secondary | ICD-10-CM

## 2016-01-01 NOTE — Telephone Encounter (Signed)
-----   Message from Gordy Levan, MD sent at 12/27/2015  3:37 PM EST ----- Labs seen and need follow up: please let patient know PET looks much better - the chemotherapy and antibody treatments have helped in all areas. Let her know that we will start the hormone blocker pill letrozole, one daily. Please send script for 2.5 mg once daily #30 with 2 RF. She should keep appointments herceptin perjeta and MD as planned.   NOTE she is deaf, will need to use service for this phone information   Thank you

## 2016-01-01 NOTE — Telephone Encounter (Signed)
Patient not available. Did not leave a message. Will need to follow up in the next couple of days with patient. 01-10-16 herceptin and projeta not scheduled. Will need follow up.

## 2016-01-01 NOTE — Therapy (Signed)
[place], [place], [address] Phone: [phone]   Fax:  [phone]  Physical Therapy Treatment  Patient Details  Name: [name] MRN: [id number] Date of Birth: October 09, 1965 Referring Provider: Dr. Evlyn Clines  Encounter Date: 01/01/2016      PT End of Session - 01/01/16 1523    Visit Number 3   Number of Visits 4   Date for PT Re-Evaluation 01/12/16   Authorization Type Medicaid   PT Start Time 1445  Started pt late due to she was being measured for garments prior to session    PT Stop Time 1525   PT Time Calculation (min) 40 min   Activity Tolerance Patient tolerated treatment well   Behavior During Therapy [facility] for tasks assessed/performed      Past Medical History:  Diagnosis Date  . Abnormal Pap smear   . Anxiety   . Breast cancer ([place])   . Cancer ([place]) 1999   breast-s/p mastectomy, chemo, rad  . CIN I (cervical intraepithelial neoplasia I) 2003   by colpo  . Congenital deafness   . Deaf   . Depression   . Radiation 07/12/15-07/26/15   left femur 30 Gy  . S/P bilateral oophorectomy     Past Surgical History:  Procedure Laterality Date  . ABDOMINAL HYSTERECTOMY    . INTRAMEDULLARY (IM) NAIL INTERTROCHANTERIC Left 06/26/2015   Procedure: LEFT BIOMET LONG AFFIXS NAIL;  Surgeon: [name], MD;  Location: [facility];  Service: Orthopedics;  Laterality: Left;  . IR GENERIC HISTORICAL  08/06/2015   IR US GUIDE VASC ACCESS LEFT 08/06/2015 Aletta Edouard, MD [facility]-INTERV RAD  . IR GENERIC HISTORICAL  08/06/2015   IR FLUORO GUIDE CV LINE LEFT 08/06/2015 Aletta Edouard, MD [facility]  . MASTECTOMY     right breast  . MASTECTOMY    . OVARIAN CYST REMOVAL    . RADIOLOGY WITH ANESTHESIA Left 06/25/2015   Procedure: MRI OF LEFT HIP WITH OR WITHOUT CONTRAST;  Surgeon: Medication Radiologist, MD;  Location: [facility];  Service: Radiology;  Laterality: Left;  DR. MCINTYRE/MRI  . TUBAL LIGATION       There were no vitals filed for this visit.                       [facility] Adult PT Treatment/Exercise - 01/01/16 0001      Manual Therapy   Manual Lymphatic Drainage (MLD) Reviewed with pt today and instructed husband as well: Short neck, 5 diaphragmatic breaths, Lt axilla nodes and Lt inguino-axillary anastomosis, and Lt LE from dorsal foot to lateral thigh working proximally to distal then retracing all steps.                PT Education - 01/01/16 1730    Education provided Yes   Education Details Instructed husband in MLD while performing   Person(s) Educated Patient   Methods Explanation;Demonstration   Comprehension Verbalized understanding;Returned demonstration                [facility] Goals - 01/01/16 1733      CC Long Term Goal  #1   Title Patient will be knowledgeable about performing manual lymph drainage.   Status Achieved     CC Long Term Goal  #2   Title Pt. will be knowledgeable about pump as well as day- and nighttime compression garments.   Status Achieved     CC Long Term Goal  #  3   Title lymphedema life impact score reduced to 30% impairment or less   Status On-going            Plan - 01/01/16 1720    Clinical Impression Statement Pt did well with review of self MLD today though reports difficulty with reaching below knee and foot due to the increased LE pain she has been experiencing. So husband was present and instructed him in MLD so he can assist her with the lower portion of her leg. He demonstrated very good technique with this and pt reported same. Pt was also measured for her compresion garments at beginning of session today by [place].   Rehab Potential Good   Clinical Impairments Affecting Rehab Potential bony metastases   PT Frequency --  3 total visits   PT Duration 6 weeks   PT Treatment/Interventions ADLs/Self Care Home Management;DME Instruction;Therapeutic exercise;Patient/family  education;Orthotic Fit/Training;Manual techniques;Manual lymph drainage;Therapeutic activities   PT Next Visit Plan Remeasure circumference and assess goals; assess for indep with MLD, assess if pt received compression pump. D/C next visit due to Medicaid.   Consulted and Agree with Plan of Care Patient      Patient will benefit from skilled therapeutic intervention in order to improve the following deficits and impairments:  Increased edema, Decreased knowledge of use of DME, Decreased knowledge of precautions, Pain  Visit Diagnosis: Lymphedema, not elsewhere classified  Pain in left leg  Stiffness of left hip, not elsewhere classified  Other abnormalities of gait and mobility  Pain in left hip     Problem List Patient Active Problem List   Diagnosis Date Noted  . Anemia due to antineoplastic chemotherapy 12/11/2015  . Lymphedema of left leg 12/11/2015  . Osteopenia of multiple sites 11/26/2015  . Drug-induced diarrhea 11/26/2015  . Pain of left hip joint 11/08/2015  . Chemotherapy-induced peripheral neuropathy ([place]) 11/03/2015  . Port catheter in place 09/26/2015  . Chemotherapy induced nausea and vomiting 08/13/2015  . Cancer related pain 08/13/2015  . Portacath in place 08/13/2015  . Drug allergy, multiple 08/13/2015  . HER2-positive carcinoma of right breast ([place]) 07/26/2015  . [name], right ([place]) 07/14/2015  . Other iron deficiency anemias 07/14/2015  . Claustrophobia 07/14/2015  . Status post laparoscopic cholecystectomy 07/14/2015  . S/P TRAM (transverse rectus abdominis muscle) flap breast reconstruction 07/14/2015  . Thyroid nodule 07/09/2015  . Constipation due to pain medication   . Metastasis from breast cancer ([place])   . Left hip pain   . Metastatic bone cancer ([place])   . Depression   . Generalized anxiety disorder   . Esophageal reflux   . Bony metastasis ([place]) 06/25/2015  . Breast cancer ([place]) 06/25/2015  . Genetic  testing 04/04/2015  . Congenital deafness 02/26/2015  . Noncompliance with therapeutic plan 02/26/2015  . Hx of BSO (bilateral salpingo-oophorectomy) 02/26/2015  . Premature surgical menopause 02/26/2015  . Benign paroxysmal positional vertigo 07/24/2014  . Migraine 07/24/2014  . RLQ abdominal pain 09/30/2013  . Dysuria 09/30/2013  . Hx of breast cancer 04/28/2013  . Colon cancer screening 04/28/2013  . Migraine without aura, with intractable migraine, so stated, without mention of status migrainosus 06/22/2012  . Dizziness 05/14/2011  . Pharyngitis, acute 09/04/2010  . BACK STRAIN, LUMBAR 01/21/2010  . DE QUERVAIN'S TENOSYNOVITIS, LEFT WRIST 10/07/2009  . EMESIS 02/05/2009  . ABDOMINAL PAIN, GENERALIZED 02/05/2009  . DIZZINESS, CHRONIC 01/25/2007  . INSOMNIA 01/25/2007  . Hearing loss 03/11/2006  . CERVICAL DYSPLASIA 03/11/2006  .  MENSTRUATION, PAINFUL 03/11/2006    Otelia Limes, PTA 01/01/2016, 5:34 PM  [place], [place], [address] Phone: [phone]   Fax:  [phone]  Name: [name] MRN: [id number] Date of Birth: 1965-08-12

## 2016-01-02 ENCOUNTER — Other Ambulatory Visit: Payer: Self-pay | Admitting: Oncology

## 2016-01-02 ENCOUNTER — Telehealth: Payer: Self-pay

## 2016-01-02 NOTE — Telephone Encounter (Signed)
Tried to call pt again

## 2016-01-02 NOTE — Telephone Encounter (Signed)
-----   Message from Gordy Levan, MD sent at 12/27/2015  3:37 PM EST ----- Labs seen and need follow up: please let patient know PET looks much better - the chemotherapy and antibody treatments have helped in all areas. Let her know that we will start the hormone blocker pill letrozole, one daily. Please send script for 2.5 mg once daily #30 with 2 RF. She should keep appointments herceptin perjeta and MD as planned.   NOTE she is deaf, will need to use service for this phone information   Thank you

## 2016-01-02 NOTE — Telephone Encounter (Signed)
lvm via service to please return call.

## 2016-01-03 ENCOUNTER — Telehealth: Payer: Self-pay | Admitting: Oncology

## 2016-01-03 MED ORDER — LETROZOLE 2.5 MG PO TABS: 2.5000 mg | ORAL_TABLET | Freq: Every day | ORAL | 2 refills | Status: DC

## 2016-01-03 NOTE — Telephone Encounter (Signed)
inbasket sent for adding infusion appt to her 01/16/16 appts for herceptin/perjeta, high priority

## 2016-01-03 NOTE — Telephone Encounter (Signed)
lvm we are adding chemo to appt on 1/4, it will add about 2 hours to appt Called pt again and lvm that she is to start letrozole and the rx was sent to [facility].

## 2016-01-03 NOTE — Telephone Encounter (Signed)
Added inf to 1/4 appointments per 12/22 sch msg. Left message via answering service re 1/4 appointments.

## 2016-01-08 ENCOUNTER — Telehealth: Payer: Self-pay | Admitting: *Deleted

## 2016-01-08 ENCOUNTER — Telehealth: Payer: Self-pay

## 2016-01-08 NOTE — Telephone Encounter (Signed)
e

## 2016-01-08 NOTE — Telephone Encounter (Signed)
-----   Message from Gordy Levan, MD sent at 12/27/2015  3:37 PM EST ----- Labs seen and need follow up: please let patient know PET looks much better - the chemotherapy and antibody treatments have helped in all areas. Let her know that we will start the hormone blocker pill letrozole, one daily. Please send script for 2.5 mg once daily #30 with 2 RF. She should keep appointments herceptin perjeta and MD as planned.   NOTE she is deaf, will need to use service for this phone information   Thank you

## 2016-01-08 NOTE — Telephone Encounter (Signed)
Patient called in with interpreter. Patient states that she noticed a 1 big blister today on her left leg (painfuls, burning, leaking yellow discharge. MD Burr Medico (on-call) notified. MD Burr Medico requested that Selena Lesser see patient if possible. RN spoke with Ronalee Belts. Patient scheduled for 01/09/16 for evaluation.

## 2016-01-08 NOTE — Telephone Encounter (Signed)
lvm via interpreter [phone] about letrozole, she had not picked it up as of yesterday when I called pharmacy. Also restated her 01/16/16 appts.

## 2016-01-09 ENCOUNTER — Ambulatory Visit (HOSPITAL_BASED_OUTPATIENT_CLINIC_OR_DEPARTMENT_OTHER): Payer: Medicaid Other | Admitting: Nurse Practitioner

## 2016-01-09 VITALS — BP 127/70 | HR 78 | Temp 97.7°F | Resp 20 | Ht 67.0 in | Wt 132.4 lb

## 2016-01-09 DIAGNOSIS — L739 Follicular disorder, unspecified: Secondary | ICD-10-CM | POA: Diagnosis present

## 2016-01-09 DIAGNOSIS — C50911 Malignant neoplasm of unspecified site of right female breast: Secondary | ICD-10-CM | POA: Diagnosis not present

## 2016-01-09 DIAGNOSIS — S80822A Blister (nonthermal), left lower leg, initial encounter: Secondary | ICD-10-CM | POA: Diagnosis not present

## 2016-01-09 DIAGNOSIS — I890 Lymphedema, not elsewhere classified: Secondary | ICD-10-CM | POA: Diagnosis not present

## 2016-01-09 DIAGNOSIS — T148XXA Other injury of unspecified body region, initial encounter: Secondary | ICD-10-CM

## 2016-01-09 DIAGNOSIS — X58XXXA Exposure to other specified factors, initial encounter: Secondary | ICD-10-CM

## 2016-01-10 ENCOUNTER — Telehealth: Payer: Self-pay

## 2016-01-10 ENCOUNTER — Encounter: Payer: Self-pay | Admitting: Nurse Practitioner

## 2016-01-10 DIAGNOSIS — T148XXA Other injury of unspecified body region, initial encounter: Secondary | ICD-10-CM | POA: Insufficient documentation

## 2016-01-10 DIAGNOSIS — L739 Follicular disorder, unspecified: Secondary | ICD-10-CM | POA: Insufficient documentation

## 2016-01-10 NOTE — Assessment & Plan Note (Signed)
Patient suffers with chronic left lower extremity edema secondary to her disease.  She wears a lymphedema pump to her entire left lower extremity on a once daily basis.  She has now developed what appears to be a slowly resolving blood blister to the left anterior  shin..  Patient states that the blister ruptured and drained.  Exam today reveals an approximately 2 cm in diameter blister that has ruptured and is slowly resolving.  There is no surrounding erythema, edema, warmth, tenderness, or red streaks.  Patient was advised to keep the area clean and to watch for any signs of infection.  This provider.  Placed a Vaseline gauze over the open blister area; and also gave patient some supplies as well.

## 2016-01-10 NOTE — Progress Notes (Signed)
SYMPTOM MANAGEMENT CLINIC    Chief Complaint: Blood blister, folliculitis  HPI:  [name] 50 y.o. female diagnosed with breast cancer with bone metastasis.  Currently undergoing Herceptin/Perjeta/Zometa infusions.  Has also recently been prescribed letrozole as well.    No history exists.    Review of Systems  HENT: Positive for hearing loss.   Skin:       Left anterior blood blister and folliculitis to scalp.  All other systems reviewed and are negative.   Past Medical History:  Diagnosis Date  . Abnormal Pap smear   . Anxiety   . Breast cancer ([place])   . Cancer ([place]) 1999   breast-s/p mastectomy, chemo, rad  . CIN I (cervical intraepithelial neoplasia I) 2003   by colpo  . Congenital deafness   . Deaf   . Depression   . Radiation 07/12/15-07/26/15   left femur 30 Gy  . S/P bilateral oophorectomy     Past Surgical History:  Procedure Laterality Date  . ABDOMINAL HYSTERECTOMY    . INTRAMEDULLARY (IM) NAIL INTERTROCHANTERIC Left 06/26/2015   Procedure: LEFT BIOMET LONG AFFIXS NAIL;  Surgeon: [name], MD;  Location: [facility];  Service: Orthopedics;  Laterality: Left;  . IR GENERIC HISTORICAL  08/06/2015   IR US GUIDE VASC ACCESS LEFT 08/06/2015 Aletta Edouard, MD [facility]  . IR GENERIC HISTORICAL  08/06/2015   IR FLUORO GUIDE CV LINE LEFT 08/06/2015 Aletta Edouard, MD [facility]  . MASTECTOMY     right breast  . MASTECTOMY    . OVARIAN CYST REMOVAL    . RADIOLOGY WITH ANESTHESIA Left 06/25/2015   Procedure: MRI OF LEFT HIP WITH OR WITHOUT CONTRAST;  Surgeon: Medication Radiologist, MD;  Location: [facility];  Service: Radiology;  Laterality: Left;  DR. MCINTYRE/MRI  . TUBAL LIGATION      has Hearing loss; CERVICAL DYSPLASIA; MENSTRUATION, PAINFUL; DE QUERVAIN'S TENOSYNOVITIS, LEFT WRIST; DIZZINESS, CHRONIC; INSOMNIA; EMESIS; ABDOMINAL PAIN, GENERALIZED; BACK STRAIN, LUMBAR; Pharyngitis, acute; Dizziness; Migraine without aura, with intractable migraine, so  stated, without mention of status migrainosus; Hx of breast cancer; Colon cancer screening; RLQ abdominal pain; Dysuria; Benign paroxysmal positional vertigo; Migraine; Congenital deafness; Noncompliance with therapeutic plan; Hx of BSO (bilateral salpingo-oophorectomy); Premature surgical menopause; Genetic testing; Bony metastasis ([place]); Breast cancer ([place]); Metastatic bone cancer ([place]); Depression; Generalized anxiety disorder; Esophageal reflux; Left hip pain; Constipation due to pain medication; Metastasis from breast cancer ([place]); Thyroid nodule; [name], right ([place]); Other iron deficiency anemias; Claustrophobia; Status post laparoscopic cholecystectomy; S/P TRAM (transverse rectus abdominis muscle) flap breast reconstruction; HER2-positive carcinoma of right breast ([place]); Chemotherapy induced nausea and vomiting; Cancer related pain; Portacath in place; Drug allergy, multiple; Port catheter in place; Chemotherapy-induced peripheral neuropathy ([place]); Pain of left hip joint; Osteopenia of multiple sites; Drug-induced diarrhea; Anemia due to antineoplastic chemotherapy; Lymphedema of left leg; Folliculitis; and Blood blister on her problem list.    is allergic to chlorhexidine; promethazine hcl; and diphenhydramine hcl.  Allergies as of 01/09/2016      Reactions   Chlorhexidine Itching   Promethazine Hcl    REACTION: itchy and red   Diphenhydramine Hcl Itching, Rash      Medication List       Accurate as of 01/09/16 11:59 PM. Always use your most recent med list.          Cholecalciferol 2000 units Caps Take 1 capsule by mouth daily.   divalproex 250 MG DR tablet Commonly known  as:  DEPAKOTE Take 1 tablet (250 mg total) by mouth at bedtime.   HYDROcodone-acetaminophen 7.5-325 MG tablet Commonly known as:  NORCO Take 1-2 tabs PO Q 6 hours PRN pain.   ibuprofen 200 MG tablet Commonly known as:  ADVIL,MOTRIN Take 200 mg by mouth every 6 (six) hours as  needed for mild pain.   letrozole 2.5 MG tablet Commonly known as:  FEMARA Take 1 tablet (2.5 mg total) by mouth daily.   lidocaine-prilocaine cream Commonly known as:  EMLA Apply 1 application topically as needed. Apply to Porta-Cath 1-2 hours prior to access as directed.   loperamide 2 MG tablet Commonly known as:  IMODIUM A-D Take 1-2 tablets after each loose stool. UP TO 6 IN  24 HRS.   LORazepam 1 MG tablet Commonly known as:  ATIVAN Place one tablet under the tongue or swallow every 4-6 hours as needed for nausea. (THIS IS THE SAME STRENGTH AS 2 OF YOUR 0.5 MG TABLETS)   ondansetron 8 MG tablet Commonly known as:  ZOFRAN Take 1 tablet (8 mg total) by mouth every 8 (eight) hours as needed for nausea or vomiting.   potassium chloride 10 MEQ tablet Commonly known as:  K-DUR Take 2 tablets (20 mEq total) by mouth daily.        PHYSICAL EXAMINATION  Oncology Vitals 01/09/2016 12/19/2015  Height 170 cm -  Weight 60.056 kg 60.918 kg  Weight (lbs) 132 lbs 6 oz 134 lbs 5 oz  BMI (kg/m2) 20.74 kg/m2 21.03 kg/m2  Temp 97.7 97.2  Pulse 78 87  Resp 20 18  SpO2 100 100  BSA (m2) 1.69 m2 1.7 m2   BP Readings from Last 2 Encounters:  01/09/16 127/70  12/19/15 119/60    Physical Exam  Constitutional: She is oriented to person, place, and time and well-developed, well-nourished, and in no distress.  HENT:  Head: Normocephalic and atraumatic.  Mouth/Throat: Oropharynx is clear and moist.  Patient is deaf as baseline.  Eyes: Conjunctivae and EOM are normal. Pupils are equal, round, and reactive to light. Right eye exhibits no discharge. Left eye exhibits no discharge. No scleral icterus.  Neck: Normal range of motion.  Pulmonary/Chest: Effort normal. No respiratory distress.  Musculoskeletal: Normal range of motion. She exhibits no edema or tenderness.  Neurological: She is alert and oriented to person, place, and time.  Skin: Skin is warm and dry.  Patient has several  pustules to the back of her scalp that appear to be folliculitis.  Patient also has a slowly resolving.  2 cm in diameter blood blister to the left anterior shin.  No evidence of infection noted.  Psychiatric: Affect normal.  Nursing note and vitals reviewed.   LABORATORY DATA:. No visits with results within 3 Day(s) from this visit.  Latest known visit with results is:  Hospital Outpatient Visit on 12/25/2015  Component Date Value Ref Range Status  . Glucose-Capillary 12/25/2015 81  65 - 99 mg/dL Final       RADIOGRAPHIC STUDIES: No results found.  ASSESSMENT/PLAN:    HER2-positive carcinoma of right breast [facility]) Patient recently underwent a PET scan which showed improvement of her disease.  Patient stated that she was very happy to hear this news per Dr. Marko Plume recently.  Patient last received Perjeta/Herceptin/Zometa on 12/19/2015.  When this provider reviewed.  Patient's next appointments which are scheduled for labs, flush, visit, and her next infusion on 01/16/2016-patient stated that she had completed all of her infusions and had no  plans for any further infusions.  Also-unclear if patient has started taking the letrozole oral therapy.  Advised both patient and her family member; as well as the deaf interpreter that would review all with Dr. Marko Plume; and someone would be in contact with her regarding any future appointments and/or infusions.  Folliculitis Patient is very happy to report that her hair is slowly growing back.  She presented to the [facility] today with complaint of some bumps to the back of her head.  Exam reveals what appears to be  several pustules to the back of patient's hair that are most likely folliculitis..  Patient was advised to wash her head with a medicated shampoo or Dial soap/antibacterial soap.  She was also advised to use warm, moist compresses to the sites as well.  She can try applying Neosporin to the sites as well.  Patient was advised  to call/return or go directly to the emergency department for any worsening symptoms whatsoever.  Blood blister Patient suffers with chronic left lower extremity edema secondary to her disease.  She wears a lymphedema pump to her entire left lower extremity on a once daily basis.  She has now developed what appears to be a slowly resolving blood blister to the left anterior  shin..  Patient states that the blister ruptured and drained.  Exam today reveals an approximately 2 cm in diameter blister that has ruptured and is slowly resolving.  There is no surrounding erythema, edema, warmth, tenderness, or red streaks.  Patient was advised to keep the area clean and to watch for any signs of infection.  This provider.  Placed a Vaseline gauze over the open blister area; and also gave patient some supplies as well.   Patient stated understanding of all instructions; and was in agreement with this plan of care. The patient knows to call the clinic with any problems, questions or concerns.   Total time spent with patient was 25 minutes;  with greater than 75 percent of that time spent in face to face counseling regarding patient's symptoms,  and coordination of care and follow up.  Disclaimer:This dictation was prepared with Dragon/digital dictation along with [employer]. Any transcriptional errors that result from this process are unintentional.  Drue Second, NP 01/10/2016

## 2016-01-10 NOTE — Telephone Encounter (Signed)
lvm on cell that I left 2 VM on home phone via interpreter. She has a new medication called letrozole at pharmacy to take daily. She has appt on 1/4 for lab flush MD and infusion. Yes her PET was improved but we want to keep that improvement. She can discuss with Dr Marko Plume on 1/4.

## 2016-01-10 NOTE — Assessment & Plan Note (Signed)
Patient recently underwent a PET scan which showed improvement of her disease.  Patient stated that she was very happy to hear this news per Dr. Marko Plume recently.  Patient last received Perjeta/Herceptin/Zometa on 12/19/2015.  When this provider reviewed.  Patient's next appointments which are scheduled for labs, flush, visit, and her next infusion on 01/16/2016-patient stated that she had completed all of her infusions and had no plans for any further infusions.  Also-unclear if patient has started taking the letrozole oral therapy.  Advised both patient and her family member; as well as the deaf interpreter that would review all with Dr. Marko Plume; and someone would be in contact with her regarding any future appointments and/or infusions.

## 2016-01-10 NOTE — Assessment & Plan Note (Signed)
Patient is very happy to report that her hair is slowly growing back.  She presented to the Center today with complaint of some bumps to the back of her head.  Exam reveals what appears to be  several pustules to the back of patient's hair that are most likely folliculitis..  Patient was advised to wash her head with a medicated shampoo or Dial soap/antibacterial soap.  She was also advised to use warm, moist compresses to the sites as well.  She can try applying Neosporin to the sites as well.  Patient was advised to call/return or go directly to the emergency department for any worsening symptoms whatsoever.

## 2016-01-14 ENCOUNTER — Other Ambulatory Visit: Payer: Self-pay | Admitting: Oncology

## 2016-01-15 ENCOUNTER — Ambulatory Visit: Payer: Medicaid Other | Attending: Oncology

## 2016-01-15 ENCOUNTER — Other Ambulatory Visit: Payer: Self-pay | Admitting: Oncology

## 2016-01-15 DIAGNOSIS — M79605 Pain in left leg: Secondary | ICD-10-CM | POA: Diagnosis present

## 2016-01-15 DIAGNOSIS — I890 Lymphedema, not elsewhere classified: Secondary | ICD-10-CM | POA: Insufficient documentation

## 2016-01-15 DIAGNOSIS — M25552 Pain in left hip: Secondary | ICD-10-CM | POA: Diagnosis present

## 2016-01-15 DIAGNOSIS — R2689 Other abnormalities of gait and mobility: Secondary | ICD-10-CM | POA: Diagnosis present

## 2016-01-15 DIAGNOSIS — M25652 Stiffness of left hip, not elsewhere classified: Secondary | ICD-10-CM | POA: Diagnosis present

## 2016-01-15 NOTE — Patient Instructions (Addendum)
Cancer Rehab [phone] Heel Slide    Bend left knee and pull heel toward buttocks. Use towel behind knee so you can relax thigh.  In sitting can do this by scooting heel under chair.  Repeat __3__ times. Hold 20-30 seconds. Do _3-4___ sessions per day.  http://gt2.exer.us/301   Copyright  VHI. All rights reserved.   Hamstring Step 1    Straighten left knee. Keep knee level with other knee or on bolster. Hold _20-30__ seconds. Relax knee by returning foot to start. Can also do in sitting with heel on floor, leg straight, lean forward with chest.  Repeat _3__ times.   Copyright  VHI. All rights reserved.   **Remember not to push into pain at all!! Soreness after stretching is expected, not pain. If you have pain, especially any that persists stop stretches!**

## 2016-01-15 NOTE — Therapy (Addendum)
[place], [place], [address] Phone: [phone]   Fax:  [phone]  Physical Therapy Treatment  Patient Details  Name: [name] MRN: [id number] Date of Birth: Jun 25, 1965 Referring Provider: Dr. Evlyn Clines  Encounter Date: 01/15/2016      PT End of Session - 01/15/16 1725    Visit Number 4   Number of Visits 4   Date for PT Re-Evaluation 01/12/16   Authorization Type Medicaid   PT Start Time 1434   PT Stop Time 1521   PT Time Calculation (min) 47 min   Activity Tolerance Patient tolerated treatment well   Behavior During Therapy [facility] for tasks assessed/performed      Past Medical History:  Diagnosis Date  . Abnormal Pap smear   . Anxiety   . Breast cancer ([place])   . Cancer ([place]) 1999   breast-s/p mastectomy, chemo, rad  . CIN I (cervical intraepithelial neoplasia I) 2003   by colpo  . Congenital deafness   . Deaf   . Depression   . Radiation 07/12/15-07/26/15   left femur 30 Gy  . S/P bilateral oophorectomy     Past Surgical History:  Procedure Laterality Date  . ABDOMINAL HYSTERECTOMY    . INTRAMEDULLARY (IM) NAIL INTERTROCHANTERIC Left 06/26/2015   Procedure: LEFT BIOMET LONG AFFIXS NAIL;  Surgeon: [name], MD;  Location: [facility];  Service: Orthopedics;  Laterality: Left;  . IR GENERIC HISTORICAL  08/06/2015   IR US GUIDE VASC ACCESS LEFT 08/06/2015 Aletta Edouard, MD [facility]-INTERV RAD  . IR GENERIC HISTORICAL  08/06/2015   IR FLUORO GUIDE CV LINE LEFT 08/06/2015 Aletta Edouard, MD [facility]  . MASTECTOMY     right breast  . MASTECTOMY    . OVARIAN CYST REMOVAL    . RADIOLOGY WITH ANESTHESIA Left 06/25/2015   Procedure: MRI OF LEFT HIP WITH OR WITHOUT CONTRAST;  Surgeon: Medication Radiologist, MD;  Location: [facility];  Service: Radiology;  Laterality: Left;  DR. MCINTYRE/MRI  . TUBAL LIGATION      There were no vitals filed for this visit.      Subjective Assessment -  01/15/16 1449    Subjective I've been doing the massage and I feel like it's going well. I had a spot pop up on my Lt anterior lower leg like a blister and then it drained but it's still tender so I haven't been using the pump. My Lt knee is just doing okay, it feels             [facility] PT Assessment - 01/15/16 0001      Observation/Other Assessments   Other Surveys  --  LLIS score 28=41% impairement                     [facility] Adult PT Treatment/Exercise - 01/15/16 0001      Knee/Hip Exercises: Stretches   Passive Hamstring Stretch Left;3 reps;20 seconds   Passive Hamstring Stretch Limitations Used gait belt; assisted pt for proper technique   Knee: Self-Stretch to increase Flexion Left;3 reps;20 seconds   Knee: Self-Stretch Limitations Used gait belt and assisted pt for proper technique     Manual Therapy   Manual therapy comments Placed nonadhesive dressing with paper tape to Lt lower anterior leg where blisters are present (one has already opened, another has not)   Manual Lymphatic Drainage (MLD) Reveiwed with pt today in supine with legs on bolster: Short neck, 5 diaphragmatic  breaths, Lt axillary nodes, Lt inguino-axillary anastomosis, and Lt knee and upper thigh with therapist performing but verbally reviewing with pt and husband throughout.                 PT Education - 01/15/16 1511    Education provided Yes   Education Details LE flexibility and reviewed MLD   Person(s) Educated Patient   Methods Explanation;Demonstration;Handout   Comprehension Verbalized understanding;Returned demonstration                [facility] Goals - 01/15/16 1736      CC Long Term Goal  #1   Title Patient will be knowledgeable about performing manual lymph drainage.   Status Achieved     CC Long Term Goal  #2   Title Pt. will be knowledgeable about pump as well as day- and nighttime compression garments.   Status Achieved     CC Long Term Goal  #3    Title lymphedema life impact score reduced to 30% impairment or less   Baseline 65% impairment (score of 44); 41% impairment (score 28)   Status Partially Met            Plan - 01/15/16 1730    Clinical Impression Statement Pt did well with review of MLD verbalizing good understanding of sequencing. She had not been doing this or using her Flexitouch for the past week or so due to blisters that had formed on her anterior lower leg but assured her okay to cont MLD just avoid blistered area. But okay to hold off on Flexitouch until skin heals. She did well with instruction of Lt knee flexibility as well to help promote lymph fluid flow, though did need VC to not push into pain. Pt will be D/C'd at this time due to Medicaid, though she plans to apply for Medicare so may be back in the future.  Pt refused to remove her pants today due to cold weather so unable to remeasure her circumference today.    Rehab Potential Good   Clinical Impairments Affecting Rehab Potential bony metastases   PT Frequency --  3 total visits   PT Duration 6 weeks   PT Treatment/Interventions ADLs/Self Care Home Management;DME Instruction;Therapeutic exercise;Patient/family education;Orthotic Fit/Training;Manual techniques;Manual lymph drainage;Therapeutic activities   PT Next Visit Plan D/C this visit.    Consulted and Agree with Plan of Care Patient      Patient will benefit from skilled therapeutic intervention in order to improve the following deficits and impairments:  Increased edema, Decreased knowledge of use of DME, Decreased knowledge of precautions, Pain  Visit Diagnosis: Lymphedema, not elsewhere classified  Pain in left leg  Stiffness of left hip, not elsewhere classified  Other abnormalities of gait and mobility  Pain in left hip     Problem List Patient Active Problem List   Diagnosis Date Noted  . Folliculitis 09/60/4540  . Blood blister 01/10/2016  . Anemia due to antineoplastic  chemotherapy 12/11/2015  . Lymphedema of left leg 12/11/2015  . Osteopenia of multiple sites 11/26/2015  . Drug-induced diarrhea 11/26/2015  . Pain of left hip joint 11/08/2015  . Chemotherapy-induced peripheral neuropathy ([place]) 11/03/2015  . Port catheter in place 09/26/2015  . Chemotherapy induced nausea and vomiting 08/13/2015  . Cancer related pain 08/13/2015  . Portacath in place 08/13/2015  . Drug allergy, multiple 08/13/2015  . HER2-positive carcinoma of right breast ([place]) 07/26/2015  . [name], right ([place]) 07/14/2015  .  Other iron deficiency anemias 07/14/2015  . Claustrophobia 07/14/2015  . Status post laparoscopic cholecystectomy 07/14/2015  . S/P TRAM (transverse rectus abdominis muscle) flap breast reconstruction 07/14/2015  . Thyroid nodule 07/09/2015  . Constipation due to pain medication   . Metastasis from breast cancer ([place])   . Left hip pain   . Metastatic bone cancer ([place])   . Depression   . Generalized anxiety disorder   . Esophageal reflux   . Bony metastasis ([place]) 06/25/2015  . Breast cancer ([place]) 06/25/2015  . Genetic testing 04/04/2015  . Congenital deafness 02/26/2015  . Noncompliance with therapeutic plan 02/26/2015  . Hx of BSO (bilateral salpingo-oophorectomy) 02/26/2015  . Premature surgical menopause 02/26/2015  . Benign paroxysmal positional vertigo 07/24/2014  . Migraine 07/24/2014  . RLQ abdominal pain 09/30/2013  . Dysuria 09/30/2013  . Hx of breast cancer 04/28/2013  . Colon cancer screening 04/28/2013  . Migraine without aura, with intractable migraine, so stated, without mention of status migrainosus 06/22/2012  . Dizziness 05/14/2011  . Pharyngitis, acute 09/04/2010  . BACK STRAIN, LUMBAR 01/21/2010  . DE QUERVAIN'S TENOSYNOVITIS, LEFT WRIST 10/07/2009  . EMESIS 02/05/2009  . ABDOMINAL PAIN, GENERALIZED 02/05/2009  . DIZZINESS, CHRONIC 01/25/2007  . INSOMNIA 01/25/2007  . Hearing loss 03/11/2006  .  CERVICAL DYSPLASIA 03/11/2006  . MENSTRUATION, PAINFUL 03/11/2006    Otelia Limes, PTA 01/15/2016, 5:37 PM  [place], [place], [address] Phone: [phone]   Fax:  [phone]  Name: EVELIA WASKEY MRN: [id number] Date of Birth: Mar 12, 1965  PHYSICAL THERAPY DISCHARGE SUMMARY  Visits from Start of Care: 4  Current functional level related to goals / functional outcomes: Goals partially (mostly) met as noted above.   Remaining deficits: Swelling continues to require management; pt. Reports still significant impact of lymphedema on her ADLs.   Education / Equipment: Self-manual lymph drainage, Flexitouch pump, leg compression garments. Plan: Patient agrees to discharge.  Patient goals were partially met. Patient is being discharged due to financial reasons.  ?????     Serafina Royals, PT 01/16/16 8:45 AM

## 2016-01-16 ENCOUNTER — Other Ambulatory Visit: Payer: Self-pay | Admitting: Nurse Practitioner

## 2016-01-16 ENCOUNTER — Ambulatory Visit: Payer: Medicaid Other

## 2016-01-16 ENCOUNTER — Ambulatory Visit (HOSPITAL_BASED_OUTPATIENT_CLINIC_OR_DEPARTMENT_OTHER): Payer: Medicaid Other | Admitting: Oncology

## 2016-01-16 ENCOUNTER — Other Ambulatory Visit (HOSPITAL_BASED_OUTPATIENT_CLINIC_OR_DEPARTMENT_OTHER): Payer: Medicaid Other

## 2016-01-16 ENCOUNTER — Ambulatory Visit (HOSPITAL_BASED_OUTPATIENT_CLINIC_OR_DEPARTMENT_OTHER): Payer: Medicaid Other

## 2016-01-16 VITALS — BP 120/58 | HR 73 | Temp 97.8°F | Resp 18 | Ht 67.0 in | Wt 132.7 lb

## 2016-01-16 DIAGNOSIS — L989 Disorder of the skin and subcutaneous tissue, unspecified: Secondary | ICD-10-CM

## 2016-01-16 DIAGNOSIS — C50911 Malignant neoplasm of unspecified site of right female breast: Secondary | ICD-10-CM

## 2016-01-16 DIAGNOSIS — C50011 Malignant neoplasm of nipple and areola, right female breast: Secondary | ICD-10-CM

## 2016-01-16 DIAGNOSIS — E876 Hypokalemia: Secondary | ICD-10-CM

## 2016-01-16 DIAGNOSIS — I890 Lymphedema, not elsewhere classified: Secondary | ICD-10-CM

## 2016-01-16 DIAGNOSIS — Z5112 Encounter for antineoplastic immunotherapy: Secondary | ICD-10-CM | POA: Diagnosis not present

## 2016-01-16 DIAGNOSIS — C7951 Secondary malignant neoplasm of bone: Secondary | ICD-10-CM | POA: Diagnosis not present

## 2016-01-16 DIAGNOSIS — Z95828 Presence of other vascular implants and grafts: Secondary | ICD-10-CM

## 2016-01-16 DIAGNOSIS — M8580 Other specified disorders of bone density and structure, unspecified site: Secondary | ICD-10-CM | POA: Diagnosis not present

## 2016-01-16 DIAGNOSIS — A4902 Methicillin resistant Staphylococcus aureus infection, unspecified site: Secondary | ICD-10-CM

## 2016-01-16 DIAGNOSIS — L089 Local infection of the skin and subcutaneous tissue, unspecified: Secondary | ICD-10-CM

## 2016-01-16 DIAGNOSIS — C799 Secondary malignant neoplasm of unspecified site: Secondary | ICD-10-CM

## 2016-01-16 DIAGNOSIS — C50919 Malignant neoplasm of unspecified site of unspecified female breast: Secondary | ICD-10-CM

## 2016-01-16 DIAGNOSIS — Z170 Estrogen receptor positive status [ER+]: Secondary | ICD-10-CM | POA: Diagnosis not present

## 2016-01-16 LAB — CBC WITH DIFFERENTIAL/PLATELET
BASO%: 0.5 % (ref 0.0–2.0)
BASOS ABS: 0 10*3/uL (ref 0.0–0.1)
EOS ABS: 0.2 10*3/uL (ref 0.0–0.5)
EOS%: 5.2 % (ref 0.0–7.0)
HEMATOCRIT: 33.6 % — AB (ref 34.8–46.6)
HEMOGLOBIN: 11.1 g/dL — AB (ref 11.6–15.9)
LYMPH#: 0.9 10*3/uL (ref 0.9–3.3)
LYMPH%: 21.6 % (ref 14.0–49.7)
MCH: 30 pg (ref 25.1–34.0)
MCHC: 33 g/dL (ref 31.5–36.0)
MCV: 90.8 fL (ref 79.5–101.0)
MONO#: 0.3 10*3/uL (ref 0.1–0.9)
MONO%: 7.7 % (ref 0.0–14.0)
NEUT#: 2.8 10*3/uL (ref 1.5–6.5)
NEUT%: 65 % (ref 38.4–76.8)
PLATELETS: 182 10*3/uL (ref 145–400)
RBC: 3.7 10*6/uL (ref 3.70–5.45)
RDW: 12.9 % (ref 11.2–14.5)
WBC: 4.3 10*3/uL (ref 3.9–10.3)

## 2016-01-16 LAB — COMPREHENSIVE METABOLIC PANEL
ALT: 8 U/L (ref 0–55)
ANION GAP: 9 meq/L (ref 3–11)
AST: 11 U/L (ref 5–34)
Albumin: 3.3 g/dL — ABNORMAL LOW (ref 3.5–5.0)
Alkaline Phosphatase: 70 U/L (ref 40–150)
BUN: 5.9 mg/dL — ABNORMAL LOW (ref 7.0–26.0)
CALCIUM: 8.6 mg/dL (ref 8.4–10.4)
CHLORIDE: 108 meq/L (ref 98–109)
CO2: 25 mEq/L (ref 22–29)
Creatinine: 0.7 mg/dL (ref 0.6–1.1)
EGFR: >90 mL/min/{1.73_m2} (ref >90)
Glucose: 95 mg/dl (ref 70–140)
POTASSIUM: 3.2 meq/L — AB (ref 3.5–5.1)
Sodium: 142 mEq/L (ref 136–145)
Total Bilirubin: 1.2 mg/dL (ref 0.20–1.20)
Total Protein: 6 g/dL — ABNORMAL LOW (ref 6.4–8.3)

## 2016-01-16 MED ORDER — SODIUM CHLORIDE 0.9 % IV SOLN
210.0000 mg | Freq: Once | INTRAVENOUS | Status: AC
  Administered 2016-01-16: 210 mg via INTRAVENOUS
  Filled 2016-01-16: qty 7

## 2016-01-16 MED ORDER — LORATADINE 10 MG PO TABS
10.0000 mg | ORAL_TABLET | Freq: Every day | ORAL | Status: DC
  Administered 2016-01-16: 10 mg via ORAL
  Filled 2016-01-16: qty 1

## 2016-01-16 MED ORDER — DENOSUMAB 120 MG/1.7ML ~~LOC~~ SOLN
120.0000 mg | Freq: Once | SUBCUTANEOUS | Status: AC
  Administered 2016-01-16: 120 mg via SUBCUTANEOUS
  Filled 2016-01-16: qty 1.7

## 2016-01-16 MED ORDER — DOXYCYCLINE HYCLATE 100 MG PO TABS
100.0000 mg | ORAL_TABLET | Freq: Two times a day (BID) | ORAL | 0 refills | Status: DC
Start: 2016-01-16 — End: 2016-02-06

## 2016-01-16 MED ORDER — SODIUM CHLORIDE 0.9 % IV SOLN
Freq: Once | INTRAVENOUS | Status: AC
  Administered 2016-01-16: 15:00:00 via INTRAVENOUS

## 2016-01-16 MED ORDER — SODIUM CHLORIDE 0.9 % IJ SOLN
10.0000 mL | INTRAMUSCULAR | Status: DC | PRN
  Administered 2016-01-16: 10 mL via INTRAVENOUS
  Filled 2016-01-16: qty 10

## 2016-01-16 MED ORDER — ACETAMINOPHEN 325 MG PO TABS
650.0000 mg | ORAL_TABLET | Freq: Once | ORAL | Status: AC
  Administered 2016-01-16: 650 mg via ORAL

## 2016-01-16 MED ORDER — SODIUM CHLORIDE 0.9 % IV SOLN
6.0000 mg/kg | Freq: Once | INTRAVENOUS | Status: AC
  Administered 2016-01-16: 357 mg via INTRAVENOUS
  Filled 2016-01-16: qty 17

## 2016-01-16 MED ORDER — SODIUM CHLORIDE 0.9% FLUSH
10.0000 mL | INTRAVENOUS | Status: DC | PRN
  Administered 2016-01-16: 10 mL
  Filled 2016-01-16: qty 10

## 2016-01-16 MED ORDER — HEPARIN SOD (PORK) LOCK FLUSH 100 UNIT/ML IV SOLN
500.0000 [IU] | Freq: Once | INTRAVENOUS | Status: AC | PRN
  Administered 2016-01-16: 500 [IU]
  Filled 2016-01-16: qty 5

## 2016-01-16 MED ORDER — HYDROCODONE-ACETAMINOPHEN 7.5-325 MG PO TABS: ORAL_TABLET | ORAL | 0 refills | Status: DC

## 2016-01-16 NOTE — Progress Notes (Signed)
While giving the Xgeva injection, the patient started screaming and holding onto the bed rails crying and screaming. The patient told the interpreter to stop. The patient got partial medication and refused the rest. The injection was stopped and the remainder of medication was discarded. I wasted approximately 3/4 of the syringe. Per the interpreter, the patient never wants this medication again.

## 2016-01-16 NOTE — Progress Notes (Signed)
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 She has had very good response to 6 cycles of taxotere, herceptin, perjeta from 7--18-17 thru 11-29-15, with IV bisphosphonate monthly. She had PET 12-25-15, which shows only a few areas of slight hypermetabolic uptake now in bones.  Patient has not started letrozole, but will begin that now.   Patient's primary complaint today is worsening skin lesions scalp for past week, now also on nose and at corner of mouth. These areas appeared to be minimal folliculitis on scalp when seen at [facility] symptom management clinic on 01-09-16, has been washing with dial soap since then. She has "blisters" on anterior left shin x several days, different from the scalp lesions. She has no new or different pain otherwise, no fever, no SOB or cough, appetite very good, no N/V, no diarrhea. She has not been taking potassium, has this available. No problems with PAC. Hair is starting to grow back.   She saw lymphedema PT on 01-15-16 for LLE swelling, no further appointments scheduled. I believe that she has compression garments per PT. She is not complaining of as much discomfort LLE at this visit  Remainder of 10 point Review of Systems negative.  PAC by IR 08-06-15 Feraheme 06-29-15 and 07-22-15 Genetics testing negative by Breast Ovarian panel 03-2015 [facility] 03-15-15  CA 2729 on 06-26-15 was72.5 and 102 in early 08-2015, shortly after beginning taxotere herceptin perjeta. Marker down to 59.8 early 12-2015. Flu vaccine 10-14-15 Echocardiogram 11-07-15  ONCOLOGIC HISTORY Patient had 1.4 cm poorly differentiated right breast carcinoma diagnosed in  Nov 1999 at age 51, 1/7 axillary nodes involved, ER/PR and HER 2 positive. She had mastectomy with the 7 axillary node evaluation, 4 cycles of adriamycin/ cytoxan followed by taxotere, then five years of tamoxifen thru June 2005 (no herceptin in 1999). She did not have local radiation. She had right TRAM reconstruction. She had bilateral salpingoophorectomy in May 2005. She was briefly on aromatase inhibitor after tamoxifen, but discontinued this herself due to poor tolerance. Most recent left mammogram was done as screening on 04-15-2012 with heterogeneously dense tissue and questioned asymmetry, followed by diagnostic left mamogram on 04-28-12 which had no findings of concern. 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 She had nailing of impending left femur fracture by Dr [name] on 06-26-15. Pathology [phone] from 06-26-15 showed metastatic adenocarcinoma , strongly ER positive 95%, PR positive 2%, HER 2 positive 3+. Plain xrays suggested T1 and left 4th rib involvement, tho these also did not show on bone scan 07-01-15; like unremarkable CT, bone scan showed only activity in left femur area of surgery. PET 07-19-15 showed extensive bone involvement axial and proximal appendicular skeleton, "severe multifocal osseous metastatic disease, much greater than anticipated based on prior imaging", involved nodes right subpectoral and right internal mammary, and no distant extraosseous mets.  She had radiation to left femur 30 cGy completed 07-26-15. She continued IV bisphosphonate with zometa on 08-07-15 and had first taxotere herceptin perjeta on 08-09-15. Perjeta held cycle 3 after diarrhea cycles 1 and 2. Perjeta resumed at 50% dosing cycle 4. Taxotere dose  decreased cycle 6 11-29-15 due to concerns of fluid retention LLE.   Objective:  Vital signs in last 24 hours:  BP (!) 120/58 (BP Location: Left  Arm, Patient Position: Sitting)   Pulse 73   Temp 97.8 F (36.6 C) (Oral)   Resp 18   Ht _0  (1.702 m)   Wt 132 lb 11.2 oz (60.2 kg)   SpO2 100%   BMI 20.78 kg/m  Weight down 1 lb Alert, oriented and appropriate. Ambulatory without assistance.  Hair is beginning to grow back  HEENT:PERRL, sclerae not icteric. Oral mucosa moist without lesions, posterior pharynx clear.  Neck supple. No JVD.  Lymphatics:no supraclavicular adenopathy Resp: lungs clear to auscultation bilaterally and normal percussion bilaterally Cardio: regular rate and rhythm. No gallop. GI: soft, nontender, not distended, no mass or organomegaly. Normally active bowel sounds. Musculoskeletal/ Extremities: LLE 1-2+ swelling, without cords or tenderness. Otherwise UE and RLE without pitting edema, cords, tenderness Neuro: Congenital deafness. no significant peripheral neuropathy hands. Otherwise nonfocal. PSYCH appropriate mood and affect Skin 10-12 scattered pustular skin lesions posterior scalp, bases up to 1 cm diameter, tender. Anterior left nares with erythema, slight swelling and tenderness. Right corner of mouth with slight erythema. Left mid shin with superficial denuded skin, no pustular lesions there. Otherwise without ecchymosis, petechiae Breast exam not repeated today Portacath-site unremarkable.  Lab Results:  Results for orders placed or performed in visit on 01/16/16  CBC with Differential  Result Value Ref Range   WBC 4.3 3.9 - 10.3 10e3/uL   NEUT# 2.8 1.5 - 6.5 10e3/uL   HGB 11.1 (L) 11.6 - 15.9 g/dL   HCT 33.6 (L) 34.8 - 46.6 %   Platelets 182 145 - 400 10e3/uL   MCV 90.8 79.5 - 101.0 fL   MCH 30.0 25.1 - 34.0 pg   MCHC 33.0 31.5 - 36.0 g/dL   RBC 3.70 3.70 - 5.45 10e6/uL   RDW 12.9 11.2 - 14.5 %   lymph# 0.9 0.9 - 3.3 10e3/uL   MONO# 0.3 0.1 - 0.9 10e3/uL   Eosinophils Absolute 0.2 0.0 - 0.5 10e3/uL   Basophils Absolute 0.0 0.0 - 0.1 10e3/uL   NEUT% 65.0 38.4 - 76.8 %   LYMPH% 21.6  14.0 - 49.7 %   MONO% 7.7 0.0 - 14.0 %   EOS% 5.2 0.0 - 7.0 %   BASO% 0.5 0.0 - 2.0 %  Comprehensive metabolic panel  Result Value Ref Range   Sodium 142 136 - 145 mEq/L   Potassium 3.2 (L) 3.5 - 5.1 mEq/L   Chloride 108 98 - 109 mEq/L   CO2 25 22 - 29 mEq/L   Glucose 95 70 - 140 mg/dl   BUN 5.9 (L) 7.0 - 26.0 mg/dL   Creatinine 0.7 0.6 - 1.1 mg/dL   Total Bilirubin 1.20 0.20 - 1.20 mg/dL   Alkaline Phosphatase 70 40 - 150 U/L   AST 11 5 - 34 U/L   ALT 8 0 - 55 U/L   Total Protein 6.0 (L) 6.4 - 8.3 g/dL   Albumin 3.3 (L) 3.5 - 5.0 g/dL   Calcium 8.6 8.4 - 10.4 mg/dL   Anion Gap 9 3 - 11 mEq/L   EGFR >90 >90 ml/min/1.73 m2  CA 2729 on 12-19-15  59.8   Culture of pustular lesion on scalp requested.  Studies/Results: NUCLEAR MEDICINE PET SKULL BASE TO THIGH  12-25-15  COMPARISON:  07/19/2015  FINDINGS: NECK  No hypermetabolic lymph nodes in the neck. Chronic right maxillary  and right sphenoid sinusitis.  CHEST  No hypermetabolic mediastinal or hilar nodes. No suspicious pulmonary nodules on the CT data. The previous nodal metastatic disease along the right internal mammary and right prevascular chain has completely resolved.  ABDOMEN/PELVIS  No abnormal hypermetabolic activity within the liver, pancreas, adrenal glands, or spleen. No hypermetabolic lymph nodes in the abdomen or pelvis.  SKELETON  There has been dramatic improvement in the previous osseous metastatic disease which is nearly completely resolved in the antrum. In the left iliac wing, there is some faint residual abnormal activity with a maximum SUV of 6.0 (previously 6.8). In the right sacrum there is some faint activity with maximum SUV 5.7 (formerly 25.5). There is also some vague metabolic activity in the medullary shaft of the right proximal femur which is reduced from prior. However, the innumerable very hypermetabolic bony metastatic lesions shown on the prior exam have for the most  part resolved. Some of these lesions are still sclerotic on CT. Left hip screw noted with IM nail.  There is subcutaneous edema in the left upper thigh along with edema tracking along fascia planes in the left upper thigh.  IMPRESSION: 1. Markedly improved skeletal activity, with only several faint foci of residual accentuated metabolic activity, but resolution of the vast majority of the previously extensive osseous metastatic disease. 2. Resolution of the prior right internal mammary and right prevascular lymph nodes. 3. Residual subcutaneous edema and edema along fascia planes in the left upper thigh. This remains somewhat more than I would expect for placement of an IM nail 6 weeks ago. If there is leg swelling further down, consider Doppler venous ultrasound to rule out left lower extremity DVT. I do not perceive an obvious difference in density between the pelvic and common femoral veins on the noncontrast CT data. 4. Chronic right maxillary and right sphenoid sinusitis.  PACs images reviewed by MD  Medications: I have reviewed the patient's current medications. Begin doxycycline 100 mg bid for probable MRSA skin infection Xgeva to be used instead of zometa now.  Have refilled hydrocodone 7.5 -325  #6 for now, which she is using occasionally for LLE (and possible scalp pain with MRSA)  Resume potassium 20 mEq daily x 4 beginning today, then 10 mEq daily.  DISCUSSION  Discussed excellent response by PET from treatment used thus far, tho reminded patient and husband that goal is control of disease, which will require ongoing treatment tho not chemotherapy at present. She is to begin letrozole and I will see her again in 2-3 weeks in follow up; she will have herceptin and perjeta every 3 weeks and xgeva every 4 weeks.  Repeat echocardiogram pending 01-27-16.  Patient understands that xgeva will be used instead of zometa now. Teaching done, preauth confirmed with managed  care.  Apparent MRSA. Will confirm by culture if possible, begin treatment empirically with doxycycline. WIll ask RN to follow up by phone, as patient not always compliant, encourage showering daily, washing cap and bed linens, strict handwashing. Warm wet compresses. I do not find specific recommendations for body wash with chlorhexidine for outpatients, so will not attempt that.  Patient seen also today by APP who had seen scalp areas on 12-28, confirms lesions much progressed today.   ADDENDUM:  Patient did not tolerate SQ xgeva injection, reporting intolerable pain after 1/2 of the injection given, then pulling out the needle. Expect she will need to continue IV zometa using PAC due to extreme dislike of even SQ injections. Discussed  with experienced oncology RN who gave the partial xgeva injection.    Assessment/Plan: 1.Metastatic ER+, HER 2 + breast cancer extensively involving bone: Post nailing of impending fracture left femur by Dr [name] 06-26-15 and localradiation. Herceptin, Perjeta and taxotere given x 6 cycles from 08-09-15 thru 11-29-15, with on pro neulasta; zometa every 4 weeks. Perjeta at 50% dosing due to diarrhea. Restaging PET 12-25-15 markedly improved. Begin letrozole, continue herceptin and perjeta. Did not tolerate SQ xgeva today, so will resume zometa with next treatment. 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 RN to follow up by phone to reinforce compliance 3.congenital deafness: needs sign language interpreter for all visits. Husband also deaf.  4. swelling left thigh and lower leg: venous doppler negative 10-2015, nothing new per ortho, likely lymphedema possibly aggravated by taxotere despite decadron. Lymphedema PT completed, encouraged to continue massage and compression garments as instructed. Abraded area left shin: keep clean  and covered.  5.post laparoscopic cholecystectomy 2004 6.right TRAM with original breast cancer 1999 7.premature surgical menopause, post BSO 2034fr pelvic pain and ovarian cysts, likely was also beneficial from standpoint of the breast cancer. 8..claustrophobia: required full sedation for hip MRI. 9. Marked iron deficiency anemia: feraheme given 06-29-15 and 07-22-15.  10. PAC placed by IR 08-06-15. Peripheral IV access limited to LUE due to breast cancer surgery/ radiation 11.Possible HA with zofran, tho we had previously thought this was not the case. Antiemetics  limited by med intolerances.  12. flu vaccine given 10-14-15 [facility], on depakote by neurology for HA (or neuropathy?). Note she does not tolerate lying on stomach due to vertigo  14.osteopenic by bone density scan 03-2015:  premature menopause and plan for AI. Low Vit D, now on daily supplement.   All questions answered. Herceptin, perjeta, xgeva orders in place. As she did not tolerate xgeva, will change orders back to zometa for next treatment and will notify managed care to change preauth again. Time spent 30 min including >50% counseling and coordination of care. Route PCP.    LEvlyn Clines MD   01/16/2016, 6:36 PM

## 2016-01-16 NOTE — Patient Instructions (Signed)
[facility] Discharge Instructions for Patients Receiving Chemotherapy  Today you received the following chemotherapy agents Herceptin and Perjeta   To help prevent nausea and vomiting after your treatment, we encourage you to take your nausea medication as directed.    If you develop nausea and vomiting that is not controlled by your nausea medication, call the clinic.   BELOW ARE SYMPTOMS THAT SHOULD BE REPORTED IMMEDIATELY:  *FEVER GREATER THAN 100.5 F  *CHILLS WITH OR WITHOUT FEVER  NAUSEA AND VOMITING THAT IS NOT CONTROLLED WITH YOUR NAUSEA MEDICATION  *UNUSUAL SHORTNESS OF BREATH  *UNUSUAL BRUISING OR BLEEDING  TENDERNESS IN MOUTH AND THROAT WITH OR WITHOUT PRESENCE OF ULCERS  *URINARY PROBLEMS  *BOWEL PROBLEMS  UNUSUAL RASH Items with * indicate a potential emergency and should be followed up as soon as possible.  Feel free to call the clinic you have any questions or concerns. The clinic phone number is [phone].  Please show the CHEMO ALERT CARD at check-in to the Emergency Department and triage nurse.   

## 2016-01-17 ENCOUNTER — Telehealth: Payer: Self-pay

## 2016-01-17 ENCOUNTER — Telehealth: Payer: Self-pay | Admitting: Nurse Practitioner

## 2016-01-17 LAB — WOUND CULTURE

## 2016-01-17 NOTE — Telephone Encounter (Signed)
-----   Message from Gordy Levan, MD sent at 01/17/2016 12:32 PM EST ----- Regarding: f/u MRSA Discussed with Selena Lesser. Culture did not get sent correctly but clinically MRSA.  Will follow with treatment, will not try to get her back now to repeat culture  Please be sure she started doxycycline as instructed. Needs to wash scalp with the dial soap and shower daily. Needs to wash cap frequently, sheets etc. Family members all need to keep hands washed well.  Chlorhexidine body wash may also be helpful, but has been hard to get in past. If pharmacy has it, I can try to find protocol.  Please follow up next week by phone    thanks

## 2016-01-17 NOTE — Telephone Encounter (Signed)
Discussed precautions and adherence with ATB therapy as noted below by Dr. Marko Plume. Pt stated understanding and currently taking ATB as instructed. All communication done via sign language tela communicator.

## 2016-01-17 NOTE — Telephone Encounter (Signed)
Late entry: Per Dr. York Ram of the pustular lesions to the back of patient's scalp was drained and culture was obtained yesterday afternoon, 01/16/2016.  Patient tolerated the procedure well.  However, it does appear that the culture was unable to be resulted per lab.  It is quite possible that the wrong swab was used to obtain the culture.  Reviewed all with Dr. Marko Plume; and she confirmed the patient was prescribed doxycycline for probable MRSA infection.  Dr. Marko Plume plans to follow-up with the patient first thing next week to check in with her.

## 2016-01-18 ENCOUNTER — Encounter: Payer: Self-pay | Admitting: Oncology

## 2016-01-18 DIAGNOSIS — A4902 Methicillin resistant Staphylococcus aureus infection, unspecified site: Secondary | ICD-10-CM | POA: Insufficient documentation

## 2016-01-18 DIAGNOSIS — E876 Hypokalemia: Secondary | ICD-10-CM | POA: Insufficient documentation

## 2016-01-27 ENCOUNTER — Ambulatory Visit (HOSPITAL_COMMUNITY)
Admission: RE | Admit: 2016-01-27 | Discharge: 2016-01-27 | Disposition: A | Discharge status: Home / Self Care | Payer: Medicaid Other | Source: Ambulatory Visit | Attending: Oncology | Admitting: Oncology

## 2016-01-27 DIAGNOSIS — C799 Secondary malignant neoplasm of unspecified site: Secondary | ICD-10-CM | POA: Diagnosis not present

## 2016-01-27 DIAGNOSIS — Z79899 Other long term (current) drug therapy: Secondary | ICD-10-CM | POA: Diagnosis present

## 2016-01-27 DIAGNOSIS — I071 Rheumatic tricuspid insufficiency: Secondary | ICD-10-CM | POA: Insufficient documentation

## 2016-01-27 DIAGNOSIS — I371 Nonrheumatic pulmonary valve insufficiency: Secondary | ICD-10-CM | POA: Insufficient documentation

## 2016-01-27 DIAGNOSIS — C50919 Malignant neoplasm of unspecified site of unspecified female breast: Secondary | ICD-10-CM

## 2016-01-27 DIAGNOSIS — C50911 Malignant neoplasm of unspecified site of right female breast: Secondary | ICD-10-CM | POA: Diagnosis not present

## 2016-01-27 LAB — ECHOCARDIOGRAM LIMITED
E/e' ratio: 7.66
EWDT: 292 ms
FS: 21 % — AB (ref 28–44)
IV/PV OW: 0.85
LA diam index: 1.88 cm/m2
LA vol A4C: 29.3 ml
LASIZE: 32 mm
LDCA: 3.14 cm2
LEFT ATRIUM END SYS DIAM: 32 mm
LV E/e' medial: 7.66
LV E/e'average: 7.66
LV TDI E'LATERAL: 10.7
LV TDI E'MEDIAL: 10.1
LV e' LATERAL: 10.7 cm/s
LVOTD: 20 mm
MV Dec: 292
MV Peak grad: 3 mmHg
MV pk A vel: 61.1 m/s
MV pk E vel: 82 m/s
PW: 8.99 mm — AB (ref 0.6–1.1)

## 2016-01-27 NOTE — Progress Notes (Signed)
  Echocardiogram 2D Echocardiogram Limited has been performed.  [name] M 01/27/2016, 11:46 AM

## 2016-02-02 ENCOUNTER — Other Ambulatory Visit: Payer: Self-pay | Admitting: Oncology

## 2016-02-06 ENCOUNTER — Ambulatory Visit (HOSPITAL_BASED_OUTPATIENT_CLINIC_OR_DEPARTMENT_OTHER): Payer: Medicaid Other | Admitting: Oncology

## 2016-02-06 ENCOUNTER — Encounter: Payer: Self-pay | Admitting: Oncology

## 2016-02-06 ENCOUNTER — Ambulatory Visit: Payer: Medicaid Other

## 2016-02-06 ENCOUNTER — Other Ambulatory Visit (HOSPITAL_BASED_OUTPATIENT_CLINIC_OR_DEPARTMENT_OTHER): Payer: Medicaid Other

## 2016-02-06 ENCOUNTER — Ambulatory Visit (HOSPITAL_BASED_OUTPATIENT_CLINIC_OR_DEPARTMENT_OTHER): Payer: Medicaid Other

## 2016-02-06 VITALS — BP 111/66 | HR 73 | Temp 97.4°F | Resp 17 | Ht 67.0 in | Wt 128.6 lb

## 2016-02-06 VITALS — BP 115/58 | HR 79 | Temp 98.0°F

## 2016-02-06 DIAGNOSIS — C50911 Malignant neoplasm of unspecified site of right female breast: Secondary | ICD-10-CM

## 2016-02-06 DIAGNOSIS — Z95828 Presence of other vascular implants and grafts: Secondary | ICD-10-CM

## 2016-02-06 DIAGNOSIS — Z5112 Encounter for antineoplastic immunotherapy: Secondary | ICD-10-CM

## 2016-02-06 DIAGNOSIS — H905 Unspecified sensorineural hearing loss: Secondary | ICD-10-CM | POA: Diagnosis not present

## 2016-02-06 DIAGNOSIS — Z170 Estrogen receptor positive status [ER+]: Secondary | ICD-10-CM

## 2016-02-06 DIAGNOSIS — M8580 Other specified disorders of bone density and structure, unspecified site: Secondary | ICD-10-CM | POA: Diagnosis not present

## 2016-02-06 DIAGNOSIS — C7951 Secondary malignant neoplasm of bone: Principal | ICD-10-CM

## 2016-02-06 LAB — CBC WITH DIFFERENTIAL/PLATELET
BASO%: 0.3 % (ref 0.0–2.0)
Basophils Absolute: 0 10*3/uL (ref 0.0–0.1)
EOS%: 5.7 % (ref 0.0–7.0)
Eosinophils Absolute: 0.2 10*3/uL (ref 0.0–0.5)
HCT: 35.5 % (ref 34.8–46.6)
HGB: 12.1 g/dL (ref 11.6–15.9)
LYMPH%: 29.2 % (ref 14.0–49.7)
MCH: 29.7 pg (ref 25.1–34.0)
MCHC: 34.1 g/dL (ref 31.5–36.0)
MCV: 87 fL (ref 79.5–101.0)
MONO#: 0.3 10*3/uL (ref 0.1–0.9)
MONO%: 8.4 % (ref 0.0–14.0)
NEUT%: 56.4 % (ref 38.4–76.8)
NEUTROS ABS: 2.2 10*3/uL (ref 1.5–6.5)
Platelets: 179 10*3/uL (ref 145–400)
RBC: 4.08 10*6/uL (ref 3.70–5.45)
RDW: 12.4 % (ref 11.2–14.5)
WBC: 3.8 10*3/uL — AB (ref 3.9–10.3)
lymph#: 1.1 10*3/uL (ref 0.9–3.3)

## 2016-02-06 LAB — COMPREHENSIVE METABOLIC PANEL
ALT: 18 U/L (ref 0–55)
AST: 17 U/L (ref 5–34)
Albumin: 3.7 g/dL (ref 3.5–5.0)
Alkaline Phosphatase: 61 U/L (ref 40–150)
Anion Gap: 6 mEq/L (ref 3–11)
BILIRUBIN TOTAL: 1.24 mg/dL — AB (ref 0.20–1.20)
BUN: 7.3 mg/dL (ref 7.0–26.0)
CO2: 26 meq/L (ref 22–29)
CREATININE: 0.7 mg/dL (ref 0.6–1.1)
Calcium: 9 mg/dL (ref 8.4–10.4)
Chloride: 108 mEq/L (ref 98–109)
EGFR: >90 mL/min/{1.73_m2} (ref >90)
GLUCOSE: 88 mg/dL (ref 70–140)
Potassium: 3.6 mEq/L (ref 3.5–5.1)
SODIUM: 140 meq/L (ref 136–145)
TOTAL PROTEIN: 6.7 g/dL (ref 6.4–8.3)

## 2016-02-06 MED ORDER — HEPARIN SOD (PORK) LOCK FLUSH 100 UNIT/ML IV SOLN
500.0000 [IU] | Freq: Once | INTRAVENOUS | Status: AC | PRN
  Administered 2016-02-06: 500 [IU]
  Filled 2016-02-06: qty 5

## 2016-02-06 MED ORDER — PERTUZUMAB CHEMO INJECTION 420 MG/14ML
210.0000 mg | Freq: Once | INTRAVENOUS | Status: AC
  Administered 2016-02-06: 210 mg via INTRAVENOUS
  Filled 2016-02-06: qty 7

## 2016-02-06 MED ORDER — SODIUM CHLORIDE 0.9 % IJ SOLN
10.0000 mL | INTRAMUSCULAR | Status: DC | PRN
  Administered 2016-02-06: 10 mL via INTRAVENOUS
  Filled 2016-02-06: qty 10

## 2016-02-06 MED ORDER — SODIUM CHLORIDE 0.9% FLUSH
10.0000 mL | INTRAVENOUS | Status: DC | PRN
  Administered 2016-02-06: 10 mL
  Filled 2016-02-06: qty 10

## 2016-02-06 MED ORDER — LORATADINE 10 MG PO TABS
10.0000 mg | ORAL_TABLET | Freq: Every day | ORAL | Status: DC
  Administered 2016-02-06: 10 mg via ORAL
  Filled 2016-02-06: qty 1

## 2016-02-06 MED ORDER — ACETAMINOPHEN 325 MG PO TABS
ORAL_TABLET | ORAL | Status: AC
  Filled 2016-02-06: qty 2

## 2016-02-06 MED ORDER — ACETAMINOPHEN 325 MG PO TABS
650.0000 mg | ORAL_TABLET | Freq: Once | ORAL | Status: AC
  Administered 2016-02-06: 650 mg via ORAL

## 2016-02-06 MED ORDER — TRASTUZUMAB CHEMO 150 MG IV SOLR
6.0000 mg/kg | Freq: Once | INTRAVENOUS | Status: AC
  Administered 2016-02-06: 357 mg via INTRAVENOUS
  Filled 2016-02-06: qty 17

## 2016-02-06 NOTE — Patient Instructions (Signed)
[facility] Discharge Instructions for Patients Receiving Chemotherapy  Today you received the following chemotherapy agents Herceptin and Perjeta   To help prevent nausea and vomiting after your treatment, we encourage you to take your nausea medication as directed.    If you develop nausea and vomiting that is not controlled by your nausea medication, call the clinic.   BELOW ARE SYMPTOMS THAT SHOULD BE REPORTED IMMEDIATELY:  *FEVER GREATER THAN 100.5 F  *CHILLS WITH OR WITHOUT FEVER  NAUSEA AND VOMITING THAT IS NOT CONTROLLED WITH YOUR NAUSEA MEDICATION  *UNUSUAL SHORTNESS OF BREATH  *UNUSUAL BRUISING OR BLEEDING  TENDERNESS IN MOUTH AND THROAT WITH OR WITHOUT PRESENCE OF ULCERS  *URINARY PROBLEMS  *BOWEL PROBLEMS  UNUSUAL RASH Items with * indicate a potential emergency and should be followed up as soon as possible.  Feel free to call the clinic you have any questions or concerns. The clinic phone number is [phone].  Please show the CHEMO ALERT CARD at check-in to the Emergency Department and triage nurse.   

## 2016-02-06 NOTE — Progress Notes (Signed)
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 She has had an excellent response to 6 cycles of taxotere herceptin perjeta from 07-30-15 thru 11-29-15.  She is continuing herceptin and perjeta every 3 weeks. She had extreme local pain with first xgeva injection on 01-16-16 and prefers resuming previous zometa. She began letrozole ~ 01-16-16, has no complaints about this at all.  Last imaging was PET 12-25-15.  NOTE CTs, plain Xrays and bone scan did NOT show any of the extensively metastatic bone involvement 06-2015; MRI and PET did image disease clearly.   Patient continues to do very well overall. MRSA skin lesions on scalp and face improved quickly with doxycycline starting 01-16-16. She continues to use Dial soap and we have again strongly encouraged good hygiene with other family members at home. She has had the 3 lymphedema PT sessions allowed by insurance, continues to do massage for LLE herself, still has not gotten the ordered compression apparatus for home and will follow up with PT on that. Swelling in LLE has improved with the interventions and does not seem as uncomfortable. She denies any pain. Appetite is excellent, bowels fine, no SOB, good energy, no problems with PAC, no noted changes in left breast. No bleeding. No fever or symptoms of infection. She slipped in tub last week, fell onto right arm fortunately without injury. She was upset by a visitor and shaky then, but no tremors otherwise.  Remainder of 14 point Review of Systems negative.    PAC by IR 08-06-15 Feraheme 06-29-15 and 07-22-15 Genetics testing negative by Breast Ovarian panel 03-2015 [facility] 03-15-15  CA 2729 on  06-26-15 was72.5 and 102 in early 08-2015, shortly after beginning taxotere herceptin perjeta. Marker down to 59.8 early 12-2015. Flu vaccine 10-14-15 Echocardiogram 01-27-16   ONCOLOGIC HISTORY Patient had 1.4 cm poorly differentiated right breast carcinoma diagnosed in Nov 1999 at age 6, 1/7 axillary nodes involved, ER/PR and HER 2 positive. She had mastectomy with the 7 axillary node evaluation, 4 cycles of adriamycin/ cytoxan followed by taxotere, then five years of tamoxifen thru June 2005 (no herceptin in 1999). She did not have local radiation. She had right TRAM reconstruction. She had bilateral salpingoophorectomy in May 2005. She was briefly on aromatase inhibitor after tamoxifen, but discontinued this herself due to poor tolerance. Most recent left mammogram was done as screening on 04-15-2012 with heterogeneously dense tissue and questioned asymmetry, followed by diagnostic left mamogram on 04-28-12 which had no findings of concern. 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 She had nailing of impending left femur fracture by Dr [name] on 06-26-15. Pathology [phone] from 06-26-15 showed metastatic adenocarcinoma , strongly ER positive 95%, PR positive 2%, HER 2 positive 3+. Plain xrays suggested T1 and left 4th rib involvement, tho these also did not show on bone scan 07-01-15; like unremarkable CT, bone scan showed only activity in left femur area of surgery. PET 07-19-15 showed extensive bone involvement axial and proximal appendicular skeleton, "severe multifocal osseous metastatic disease, much greater than anticipated based on prior imaging", involved nodes right subpectoral and right internal mammary, and no distant extraosseous mets.  She had radiation to left femur 30 cGy completed 07-26-15. She continued IV bisphosphonate with zometa on 08-07-15 and had first taxotere herceptin  perjeta on 08-09-15. Perjeta held cycle 3 after diarrhea cycles 1 and 2. Perjeta resumed at 50% dosing cycle 4. Taxotere dose decreased cycle 6 11-29-15 due to concerns of fluid retention LLE.    Objective:  Vital signs in last 24 hours:  BP 111/66 (BP Location: Left Arm, Patient Position: Sitting)   Pulse 73   Temp 97.4 F (36.3 C) (Oral)   Resp 17   Ht _0  (1.702 m)   Wt 128 lb 9.6 oz (58.3 kg)   SpO2 100%   BMI 20.14 kg/m  Weight down 4 lbs, in part with some improvement in LLE swelling Alert, oriented and appropriate, cheerful, looks comfortable, communicates easily with interpreter. Ambulatory without assistance.  Hair is growing back.  HEENT:PERRL, sclerae not icteric. Oral mucosa moist without lesions, posterior pharynx clear.  Neck supple. No JVD.  Lymphatics:no cervical,supraclavicular, axillary adenopathy Resp: clear to auscultation bilaterally and normal percussion bilaterally Cardio: regular rate and rhythm. No gallop. GI: abdomen soft, nontender, not distended, no mass or organomegaly. Normally active bowel sounds.  Musculoskeletal/ Extremities: No swelling RLE. LLE still with swelling, tho less now in thigh and trace - 1+ at ankle. No cords or tenderness. No swelling either UE. Tender to palpation at site of attempted/ incomplete xgeva injection left upper arm posteriorly, no erythema or other findings there. Neuro: no change slight residual peripheral neuropathy distal feet.  Congenital deafness. Otherwise nonfocal. PSYCH appropriate mood and affect Skin all of skin lesions on scalp now minimally apparent, no erythema or tenderness, no drainage. No skin lesions now on nose of side of mouth. Otherwise skin without rash, ecchymosis, petechiae Breasts: Right TRAM not remarkable. Left breast without dominant mass, skin or nipple findings. Axillae benign. Portacath-without erythema or tenderness  Lab Results:  Results for orders placed or performed in visit on 02/06/16   CBC with Differential  Result Value Ref Range   WBC 3.8 (L) 3.9 - 10.3 10e3/uL   NEUT# 2.2 1.5 - 6.5 10e3/uL   HGB 12.1 11.6 - 15.9 g/dL   HCT 35.5 34.8 - 46.6 %   Platelets 179 145 - 400 10e3/uL   MCV 87.0 79.5 - 101.0 fL   MCH 29.7 25.1 - 34.0 pg   MCHC 34.1 31.5 - 36.0 g/dL   RBC 4.08 3.70 - 5.45 10e6/uL   RDW 12.4 11.2 - 14.5 %   lymph# 1.1 0.9 - 3.3 10e3/uL   MONO# 0.3 0.1 - 0.9 10e3/uL   Eosinophils Absolute 0.2 0.0 - 0.5 10e3/uL   Basophils Absolute 0.0 0.0 - 0.1 10e3/uL   NEUT% 56.4 38.4 - 76.8 %   LYMPH% 29.2 14.0 - 49.7 %   MONO% 8.4 0.0 - 14.0 %   EOS% 5.7 0.0 - 7.0 %   BASO% 0.3 0.0 - 2.0 %  Comprehensive metabolic panel  Result Value Ref Range   Sodium 140 136 - 145 mEq/L   Potassium 3.6 3.5 - 5.1 mEq/L   Chloride 108 98 - 109 mEq/L   CO2 26 22 - 29 mEq/L   Glucose 88 70 - 140 mg/dl   BUN 7.3 7.0 - 26.0 mg/dL   Creatinine 0.7 0.6 - 1.1 mg/dL   Total Bilirubin 1.24 (H) 0.20 - 1.20 mg/dL   Alkaline Phosphatase 61 40 - 150 U/L   AST 17 5 - 34 U/L   ALT 18 0 -  55 U/L   Total Protein 6.7 6.4 - 8.3 g/dL   Albumin 3.7 3.5 - 5.0 g/dL   Calcium 9.0 8.4 - 10.4 mg/dL   Anion Gap 6 3 - 11 mEq/L   EGFR >90 >90 ml/min/1.73 m2   CA 2729 on 12-19-15 was 59, this 78 in Nov and 93 in Sept  Labs reviewed with patient at visit. Etiology of slight increase in bilirubin not clear, follow.  Cultures of presumed MRSA scalp and face not collected correctly at last visit. Clinically this was MRSA and responded promptly to treatment for that diagnosis.   Studies/Results:  Echocardiogram 02/25/2016 Study Conclusions  - Left ventricle: The cavity size was normal. Systolic function was   normal. The estimated ejection fraction was in the range of 55%   to 60%. Wall motion was normal; there were no regional wall   motion abnormalities. Left ventricular diastolic function   parameters were normal.    Medications: I have reviewed the patient's current medications. Continue  letrozole  Herceptin and perjeta today, same dose reduction as previously for perjeta due to diarrhea  Will add zometa to next herceptin perjeta in Feb.  DISCUSSION Interval history reviewed as above.  Skin care, lymphedema interventions as above  She is in agreement with present treatment plan for letrozole, herceptin perjeta and zometa, or will be happy for Dr Burr Medico to make further recommendations.     Assessment/Plan:  1.Metastatic ER+, HER 2 + breast cancer extensively involving bone: Post nailing of impending fracture left femur by Dr [name] 06-26-15 and localradiation. Herceptin, Perjeta and taxotere given x 6 cycles from 08-09-15 thru 11-29-15, with on pro neulasta; zometa every 4 weeks. Perjeta at 50% dosing due to diarrhea.Restaging PET 12-25-15 markedly improved. Letrozole begun 01-16-16, continue herceptin and perjeta. Did not tolerate SQ xgeva so will resume zometa with next treatment. Echocardiogram 25-Feb-2016 remains good on herceptin 2.clinical MRSA skin infection scalp and face: essentially resolved, doxycycline completed 3.congenital deafness: needs sign language interpreter for all visits. Husband also deaf.  4. swelling left thigh and lower leg: venous doppler negative 10-2015, nothing new per ortho, likely lymphedema possibly aggravated by taxotere. Lymphedema PT completed, encouraged to continue massage and compression garments as instructed.  5.post laparoscopic cholecystectomy 2004 6.right TRAM with original breast cancer 1999 7.premature surgical menopause, post BSO 2070fr pelvic pain and ovarian cysts, likely was also beneficial from standpoint of the breast cancer. 8..claustrophobia: required full sedation for hip MRI. 9. Marked iron deficiency anemia: feraheme given 06-29-15 and 07-22-15. Hgb up to 12.1 off of chemo now 10. PAC placed by IR 08-06-15. Peripheral IV access limited to LUE due to breast cancer surgery/ radiation 11.Possible HA with zofran, tho we had  previously thought this was not the case. Antiemetics limited by med intolerances.  12. flu vaccine given 10-14-15 [facility], on depakote by neurology for HA (or neuropathy?). Note she does not tolerate lying on stomach due to vertigo  14.osteopenic by bone density scan 03-2015: premature menopause and plan for AI. Low Vit D, now on daily supplement.   All questions answered and she knows to call if concerns prior to next appointment. Herceptin and perjeta orders confirmed, message to managed care re resuming zometa. Time spent 25 min including >50% counseling and coordination of care. We appreciate Dr FBurr Medicofollowing her for medical oncology beginning Feb.   LEvlyn Clines MD   02/06/2016, 1:27 PM

## 2016-02-07 ENCOUNTER — Telehealth: Payer: Self-pay

## 2016-02-07 NOTE — Telephone Encounter (Signed)
S/w [facility] about support hose ordered for pt. [place] who fit the hose and LVM asking where in process of supply the order is. Delsa Bern called back and explained they are waiting on Medicaid approval then they will be ordering the hose. It takes about 10 days once ordered.  Let [facility] know pt is changing MD to Dr Burr Medico as of 02/13/16 d/t Dr Marko Plume retirement. LVM on Willie's cell about approx time frame to get hose.

## 2016-02-09 ENCOUNTER — Other Ambulatory Visit: Payer: Self-pay | Admitting: Oncology

## 2016-02-13 ENCOUNTER — Other Ambulatory Visit: Payer: Self-pay | Admitting: Hematology

## 2016-02-27 ENCOUNTER — Ambulatory Visit: Payer: Medicaid Other | Admitting: Hematology

## 2016-02-27 ENCOUNTER — Other Ambulatory Visit: Payer: Medicaid Other

## 2016-03-01 ENCOUNTER — Emergency Department (HOSPITAL_COMMUNITY)
Admission: EM | Admit: 2016-03-01 | Discharge: 2016-03-01 | Disposition: A | Discharge status: Home / Self Care | Payer: Medicaid Other | Attending: Emergency Medicine | Admitting: Emergency Medicine

## 2016-03-01 ENCOUNTER — Emergency Department (HOSPITAL_COMMUNITY): Payer: Medicaid Other

## 2016-03-01 ENCOUNTER — Encounter (HOSPITAL_COMMUNITY): Payer: Self-pay | Admitting: *Deleted

## 2016-03-01 DIAGNOSIS — W1839XA Other fall on same level, initial encounter: Secondary | ICD-10-CM | POA: Diagnosis not present

## 2016-03-01 DIAGNOSIS — Z79899 Other long term (current) drug therapy: Secondary | ICD-10-CM | POA: Diagnosis not present

## 2016-03-01 DIAGNOSIS — Y999 Unspecified external cause status: Secondary | ICD-10-CM | POA: Insufficient documentation

## 2016-03-01 DIAGNOSIS — M545 Low back pain, unspecified: Secondary | ICD-10-CM

## 2016-03-01 DIAGNOSIS — Z853 Personal history of malignant neoplasm of breast: Secondary | ICD-10-CM | POA: Insufficient documentation

## 2016-03-01 DIAGNOSIS — Z85830 Personal history of malignant neoplasm of bone: Secondary | ICD-10-CM | POA: Insufficient documentation

## 2016-03-01 DIAGNOSIS — Y9289 Other specified places as the place of occurrence of the external cause: Secondary | ICD-10-CM | POA: Diagnosis not present

## 2016-03-01 DIAGNOSIS — Y9302 Activity, running: Secondary | ICD-10-CM | POA: Diagnosis not present

## 2016-03-01 DIAGNOSIS — W19XXXA Unspecified fall, initial encounter: Secondary | ICD-10-CM

## 2016-03-01 MED ORDER — OXYCODONE-ACETAMINOPHEN 5-325 MG PO TABS: 1.0000 | ORAL_TABLET | ORAL | 0 refills | Status: DC | PRN

## 2016-03-01 MED ORDER — METHOCARBAMOL 500 MG PO TABS
500.0000 mg | ORAL_TABLET | Freq: Once | ORAL | Status: AC
  Administered 2016-03-01: 500 mg via ORAL
  Filled 2016-03-01: qty 1

## 2016-03-01 MED ORDER — OXYCODONE-ACETAMINOPHEN 5-325 MG PO TABS
1.0000 | ORAL_TABLET | Freq: Once | ORAL | Status: AC
  Administered 2016-03-01: 1 via ORAL
  Filled 2016-03-01: qty 1

## 2016-03-01 NOTE — Discharge Instructions (Signed)
As we discussed, your x-ray was normal.  No fracture, no bony lesions. Take the prescribed medication as directed.  Can use ice or heat on the back as well for added relief. Follow-up with your primary care doctor. Return to the ED for new or worsening symptoms.

## 2016-03-01 NOTE — ED Provider Notes (Signed)
[facility] DEPT Provider Note   CSN: [id number] Arrival date & time: 03/01/16  1609     History   Chief Complaint Chief Complaint  Patient presents with  . Back Pain    HPI [name] is a 51 y.o. female.  The history is provided by the patient and medical records. The history is limited by a language barrier. A language interpreter was used.  Back Pain      Sign language interpreter was used for HPI 51 y.o. F with hx of anxiety, breast cancer with known bone mets, congenital deafness, depression, presenting to the ED after a fall.  Patient states she was outside in her yard when a neighbors pitbull charged at her and knocked her down.  States she fell onto her back on the ground.  No head injury or LOC.  States this time she has had increasing pain in her low back.  States localized to middle of low back without radiation.  States she has chronic pain in the left leg but this is unchanged.  She denies numbness or weakness of her legs. No Bowel or bladder incontinence. She is not currently on any type of home pain medication has not tried any medications for her symptoms.  No recent illness.  No fever or chills.  States aside from back pain she has no complaints.  Past Medical History:  Diagnosis Date  . Abnormal Pap smear   . Anxiety   . Breast cancer ([place])   . Cancer ([place]) 1999   breast-s/p mastectomy, chemo, rad  . CIN I (cervical intraepithelial neoplasia I) 2003   by colpo  . Congenital deafness   . Deaf   . Depression   . Radiation 07/12/15-07/26/15   left femur 30 Gy  . S/P bilateral oophorectomy     Patient Active Problem List   Diagnosis Date Noted  . MRSA (methicillin resistant Staphylococcus aureus) infection 01/18/2016  . Hypokalemia 01/18/2016  . Folliculitis 16/38/4665  . Blood blister 01/10/2016  . Anemia due to antineoplastic chemotherapy 12/11/2015  . Lymphedema of left leg 12/11/2015  . Osteopenia of multiple sites 11/26/2015  .  Drug-induced diarrhea 11/26/2015  . Pain of left hip joint 11/08/2015  . Chemotherapy-induced peripheral neuropathy ([place]) 11/03/2015  . Port catheter in place 09/26/2015  . Chemotherapy induced nausea and vomiting 08/13/2015  . Cancer related pain 08/13/2015  . Portacath in place 08/13/2015  . Drug allergy, multiple 08/13/2015  . HER2-positive carcinoma of right breast ([place]) 07/26/2015  . [name], right ([place]) 07/14/2015  . Other iron deficiency anemias 07/14/2015  . Claustrophobia 07/14/2015  . Status post laparoscopic cholecystectomy 07/14/2015  . S/P TRAM (transverse rectus abdominis muscle) flap breast reconstruction 07/14/2015  . Thyroid nodule 07/09/2015  . Constipation due to pain medication   . Metastasis from breast cancer ([place])   . Left hip pain   . Metastatic bone cancer ([place])   . Depression   . Generalized anxiety disorder   . Esophageal reflux   . Bony metastasis ([place]) 06/25/2015  . Breast cancer ([place]) 06/25/2015  . Genetic testing 04/04/2015  . Congenital deafness 02/26/2015  . Noncompliance with therapeutic plan 02/26/2015  . Hx of BSO (bilateral salpingo-oophorectomy) 02/26/2015  . Premature surgical menopause 02/26/2015  . Benign paroxysmal positional vertigo 07/24/2014  . Migraine 07/24/2014  . RLQ abdominal pain 09/30/2013  . Dysuria 09/30/2013  . Hx of breast cancer 04/28/2013  . Colon cancer screening 04/28/2013  . Migraine without aura,  with intractable migraine, so stated, without mention of status migrainosus 06/22/2012  . Dizziness 05/14/2011  . Pharyngitis, acute 09/04/2010  . BACK STRAIN, LUMBAR 01/21/2010  . DE QUERVAIN'S TENOSYNOVITIS, LEFT WRIST 10/07/2009  . EMESIS 02/05/2009  . ABDOMINAL PAIN, GENERALIZED 02/05/2009  . DIZZINESS, CHRONIC 01/25/2007  . INSOMNIA 01/25/2007  . Hearing loss 03/11/2006  . CERVICAL DYSPLASIA 03/11/2006  . MENSTRUATION, PAINFUL 03/11/2006    Past Surgical History:  Procedure  Laterality Date  . ABDOMINAL HYSTERECTOMY    . INTRAMEDULLARY (IM) NAIL INTERTROCHANTERIC Left 06/26/2015   Procedure: LEFT BIOMET LONG AFFIXS NAIL;  Surgeon: [name], MD;  Location: [facility];  Service: Orthopedics;  Laterality: Left;  . IR GENERIC HISTORICAL  08/06/2015   IR US GUIDE VASC ACCESS LEFT 08/06/2015 Aletta Edouard, MD [facility]  . IR GENERIC HISTORICAL  08/06/2015   IR FLUORO GUIDE CV LINE LEFT 08/06/2015 Aletta Edouard, MD [facility]  . MASTECTOMY     right breast  . MASTECTOMY    . OVARIAN CYST REMOVAL    . RADIOLOGY WITH ANESTHESIA Left 06/25/2015   Procedure: MRI OF LEFT HIP WITH OR WITHOUT CONTRAST;  Surgeon: Medication Radiologist, MD;  Location: [facility];  Service: Radiology;  Laterality: Left;  DR. MCINTYRE/MRI  . TUBAL LIGATION      OB History    Gravida Para Term Preterm AB Living   _0 0 2   SAB TAB Ectopic Multiple Live Births   0 0 0 0         Home Medications    Prior to Admission medications   Medication Sig Start Date End Date Taking? Authorizing Provider  Cholecalciferol 2000 units CAPS Take 1 capsule by mouth daily.    Historical Provider, MD  divalproex (DEPAKOTE) 250 MG DR tablet Take 1 tablet (250 mg total) by mouth at bedtime. 12/19/15   Lennis Marion Downer, MD  HYDROcodone-acetaminophen (NORCO) 7.5-325 MG tablet Take 1 tab  PO Q 6-8 hours PRN pain. 01/16/16   Lennis Marion Downer, MD  ibuprofen (ADVIL,MOTRIN) 200 MG tablet Take 200 mg by mouth every 6 (six) hours as needed for mild pain.    Historical Provider, MD  letrozole (FEMARA) 2.5 MG tablet Take 1 tablet (2.5 mg total) by mouth daily. 01/03/16   Lennis Marion Downer, MD  lidocaine-prilocaine (EMLA) cream Apply 1 application topically as needed. Apply to Porta-Cath 1-2 hours prior to access as directed. 10/28/15   Lennis Marion Downer, MD  loperamide (IMODIUM A-D) 2 MG tablet Take 1-2 tablets after each loose stool. UP TO 6 IN  24 HRS. Patient not taking: Reported on 01/16/2016 09/06/15   Gordy Levan, MD  LORazepam (ATIVAN) 1 MG tablet Place one tablet under the tongue or swallow every 4-6 hours as needed for nausea. (THIS IS THE SAME STRENGTH AS 2 OF YOUR 0.5 MG TABLETS) Patient not taking: Reported on 01/16/2016 09/06/15   Gordy Levan, MD  Multiple Vitamin (MULTIVITAMIN) tablet Take 1 tablet by mouth daily.    Historical Provider, MD  ondansetron (ZOFRAN) 8 MG tablet Take 1 tablet (8 mg total) by mouth every 8 (eight) hours as needed for nausea or vomiting. Patient not taking: Reported on 01/16/2016 08/29/15   Gordy Levan, MD  potassium chloride (K-DUR) 10 MEQ tablet Take 2 tablets (20 mEq total) by mouth daily. 12/23/15   Lennis Marion Downer, MD    Family History Family History  Problem Relation Age of Onset  . Hypertension Mother   .  Seizures Mother   . Alzheimer's disease Maternal Uncle   . Pancreatitis Maternal Grandmother   . Heart attack Maternal Grandfather     Social History Social History  Substance Use Topics  . Smoking status: Never Smoker  . Smokeless tobacco: Never Used  . Alcohol use No     Allergies   Chlorhexidine; Promethazine hcl; and Diphenhydramine hcl   Review of Systems Review of Systems  Musculoskeletal: Positive for back pain.  All other systems reviewed and are negative.    Physical Exam Updated Vital Signs BP 131/70 (BP Location: Left Arm)   Pulse 75   Temp 98.3 F (36.8 C) (Oral)   Resp 18   SpO2 99%   Physical Exam  Constitutional: She is oriented to person, place, and time. She appears well-developed and well-nourished.  HENT:  Head: Normocephalic and atraumatic.  Mouth/Throat: Oropharynx is clear and moist.  No visible signs of head trauma  Eyes: Conjunctivae and EOM are normal. Pupils are equal, round, and reactive to light.  Neck: Normal range of motion.  Cardiovascular: Normal rate, regular rhythm and normal heart sounds.   Pulmonary/Chest: Effort normal and breath sounds normal. No respiratory distress. She has  no wheezes.  Abdominal: Soft. Bowel sounds are normal. There is no tenderness. There is no rebound.  Musculoskeletal: Normal range of motion.  Midline lumbar spine is TTP without noted deformity or step-off; no pain with ROM; pelvis is stable and non-tender; no leg shortening; DP pulses intact; ambulatory  Neurological: She is alert and oriented to person, place, and time.  Skin: Skin is warm and dry.  Psychiatric: She has a normal mood and affect.  Nursing note and vitals reviewed.    ED Treatments / Results  Labs (all labs ordered are listed, but only abnormal results are displayed) Labs Reviewed - No data to display  EKG  EKG Interpretation None       Radiology Dg Lumbar Spine Complete  Result Date: 03/01/2016 CLINICAL DATA:  Patient status post fall. Lower back pain. Initial encounter. EXAM: LUMBAR SPINE - COMPLETE 4+ VIEW COMPARISON:  PET-CT 12/25/2015. FINDINGS: Normal anatomic alignment. No evidence for acute fracture or dislocation. Preservation the vertebral body and intervertebral disc space heights. Lower lumbar spine facet degenerative changes. Postsurgical changes visualize left femur. SI joints are unremarkable. IMPRESSION: No acute appearing osseous lesion. Electronically Signed   By: Lovey Newcomer M.D.   On: 03/01/2016 19:17    Procedures Procedures (including critical care time)  Medications Ordered in ED Medications  oxyCODONE-acetaminophen (PERCOCET/ROXICET) 5-325 MG per tablet 1 tablet (1 tablet Oral Given 03/01/16 1829)  oxyCODONE-acetaminophen (PERCOCET/ROXICET) 5-325 MG per tablet 1 tablet (1 tablet Oral Given 03/01/16 1945)  methocarbamol (ROBAXIN) tablet 500 mg (500 mg Oral Given 03/01/16 1945)     Initial Impression / Assessment and Plan / ED Course  I have reviewed the triage vital signs and the nursing notes.  Pertinent labs & imaging results that were available during my care of the patient were reviewed by me and considered in my medical decision  making (see chart for details).  51 year old female here after a fall. Landed on her back after a dog lunged at her.  Reports pain of the lumbar spine since this time. She is tender along the lumbar spine without noted step-off or deformity. No pain with range of motion. Pelvis is stable. No leg shortening DP pulses intact bilaterally. No focal neurologic deficits concerning for cauda equina. Patient does have known metastatic breast cancer, last  PET scan in Dec '17 with improved bony mets in the pelvic wing and left femur.  Given fall today, x-ray obtained without acute findings.  No osseous lesions or pathologic fractures.  Patient remains ambulatory without focal deficits.  Pain has been controlled well here with oral medications. Will plan to d/c home with pain control.  She will follow-up with her primary care doctor.  Discussed plan with patient, she acknowledged understanding and agreed with plan of care.  Return precautions given for new or worsening symptoms.  Final Clinical Impressions(s) / ED Diagnoses   Final diagnoses:  Fall, initial encounter  Acute midline low back pain without sciatica    New Prescriptions Discharge Medication List as of 03/01/2016  8:32 PM    START taking these medications   Details  oxyCODONE-acetaminophen (PERCOCET/ROXICET) 5-325 MG tablet Take 1 tablet by mouth every 4 (four) hours as needed., Starting Sun 03/01/2016, Print         Larene Pickett, [name] 03/01/16 [place], MD 03/03/16 [phone]

## 2016-03-01 NOTE — ED Triage Notes (Signed)
Pt is deaf, have called for interpretor. Pt reports running from dog and now has lower back pain. Pt has bone cancer. No acute distress noted at triage.

## 2016-03-01 NOTE — ED Notes (Signed)
Patient transported to X-ray 

## 2016-03-04 NOTE — Progress Notes (Signed)
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 She had mastectomy with the 7 axillary node evaluation, 4 cycles of adriamycin/ cytoxan followed by taxotere, then five years of tamoxifen thru June 2005 (no herceptin in 1999). She had bilateral oophorectomy in May 2005. She was briefly on aromatase inhibitor after tamoxifen, but discontinued this herself due to poor tolerance.      04/04/2015 Genetic Testing    Negative genetic testing on the breast/ovarian cancer pnael.  The Breast/Ovarian gene panel offered by [facility] includes sequencing and rearrangement analysis for the following 20 genes:  ATM, BARD1, BRCA1, BRCA2, BRIP1, CDH1, CHEK2, EPCAM, FANCC, MLH1, MSH2, MSH6, NBN, PALB2, PMS2, PTEN, RAD51C, RAD51D, TP53, and XRCC2.      06/26/2015 Pathology Results    Initial Path Report Bone, curettage, Left femur met, intramedullary subtroch tissue - METASTATIC ADENOCARCINOMA.  Estrogen Receptor: 95%, POSITIVE, STRONG STAINING INTENSITY Progesterone Receptor: 2%, POSITIVE, STRONG       06/26/2015 Surgery    Biopsy of metastatic tissue and stabilization with Affixus trochanteric nail, proximal and distal interlock of left femur by Dr. [name]       06/28/2015 Imaging    CT Chest w/ Contrast 1. Extensive right internal mammary chain lymphadenopathy consistent with metastatic breast cancer. 2. Lytic metastases involving the posterior elements at T1 with probable nondisplaced pathologic fracture of the spinous process. 3. No  other evidence of thoracic metastatic disease. 4. Trace bilateral pleural effusions with associated bibasilar atelectasis. 5. Indeterminate right thyroid nodule, not previously imaged. This could be evaluated with thyroid ultrasound as clinically warranted.      07/01/2015 Imaging    Bone Scan 1. Increased activity noted throughout the left femur. Although metastatic disease cannot be excluded, these changes are most likely secondary to prior surgery. 2. No other focal abnormalities identified to suggest metastatic disease.      07/12/2015 - 07/26/2015 Radiation Therapy    Left femur, 30 Gy in 10 fractions by Dr. Sondra Come       07/14/2015 Initial Diagnosis    [name], right ([place])     07/19/2015 Imaging    Initial PET Scan 1. Severe multifocal osseous metastatic disease, much greater than anticipated based on prior imaging. 2. Multifocal nodal metastases involving the right internal mammary, prevascular and subpectoral lymph nodes. No axillary pulmonary involvement identified. 3. No distant extra osseous metastases.      07/30/2015 - 11/29/2015 Chemotherapy    Docetaxel, Herceptin and Perjeta every 3 weeks X6 cycles, pt tolerated moderately well, restaging scan showed excellent response       09/18/2015 Imaging    CT Head w/wo Contrast 1. No acute intracranial abnormality or significant interval change. 2. Stable minimal periventricular white matter hypoattenuation on the right. This may reflect a remote ischemic injury. 3. No evidence for metastatic disease to the brain. 4. No focal soft tissue lesion to explain the patient's tenderness.      10/30/2015 Imaging    CT Abdomen Pelvis w/ Contrast 1. Few  mildly prominent fluid-filled loops of small bowel scattered throughout the abdomen, with intraluminal fluid density within the distal colon. Given the provided history, findings are suggestive of acute enteritis/diarrheal illness. 2. No other acute  intra-abdominal or pelvic process identified. 3. Widespread osseous metastatic disease, better evaluated on most recent PET-CT from 07/19/2015. No pathologic fracture or other complication. 4. 11 mm hypodensity within the left kidney. This lesion measures intermediate density, and is indeterminate. While this lesion is similar in size relative to recent studies, this is increased in size relative to prior study from 2012. Further evaluation with dedicated renal mass protocol CT and/or MRI is recommended for complete characterization.      12/25/2015 Imaging    Restaging PET Scan 1. Markedly improved skeletal activity, with only several faint foci of residual accentuated metabolic activity, but resolution of the vast majority of the previously extensive osseous metastatic disease. 2. Resolution of the prior right internal mammary and right prevascular lymph nodes. 3. Residual subcutaneous edema and edema along fascia planes in the left upper thigh. This remains somewhat more than I would expect for placement of an IM nail 6 weeks ago. If there is leg swelling further down, consider Doppler venous ultrasound to rule out left lower extremity DVT. I do not perceive an obvious difference in density between the pelvic and common femoral veins on the noncontrast CT data. 4. Chronic right maxillary and right sphenoid sinusitis.      12/2015 -  Chemotherapy    Maintenance Herceptin and pejeta every 3 weeks      12/2015 -  Anti-estrogen oral therapy    Letrozole 2.5 mg daily       HISTORY OF PRESENTING ILLNESS (From Dr. Mariana Kaufman note on 02/06/2016):  [name] 51 y.o. 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 She has had an excellent response to 6 cycles of taxotere herceptin perjeta from 07-30-15 thru 11-29-15. She is continuing herceptin and perjeta every 3 weeks. She had extreme local pain  with first xgeva injection on 01-16-16 and prefers resuming previous zometa. She began letrozole ~ 01-16-16, has no complaints about this at all.  Last imaging was PET 12-25-15.  NOTE CTs, plain Xrays and bone scan did NOT show any of the extensively metastatic bone involvement 06-2015; MRI and PET did image disease clearly.  Patient continues to do very well overall. MRSA skin lesions on scalp and face improved quickly with doxycycline starting 01-16-16. She continues to use Dial soap and we have again strongly encouraged good hygiene with other family members at home. She has had the 3 lymphedema PT sessions allowed by insurance, continues to do massage for LLE herself, still has not gotten the ordered compression apparatus for home and will follow up with PT on that. Swelling in LLE has improved with the interventions and does not seem as uncomfortable. She denies any pain. Appetite is excellent, bowels fine, no SOB, good energy, no problems with PAC, no noted changes in left breast. No bleeding. No fever or symptoms of infection. She slipped in tub last week, fell onto right arm fortunately without injury. She was upset by a visitor and shaky then, but no tremors otherwise.   CURRENT THERAPY:  Letrozole 2.56m daily, Herceptin and Perjeta every 3 weeks   INTERIM HISTORY: He returns today for follow up. She has been previously seen by Dr. LMarko Plume who has retired lately. Pt is deaf and speaks  through a sign language interpreter. She says she did okay with her chemo. She was frustrated, couldn't eat, and didn't feel well. She has been taking her Letrozole and has been doing well. She has been having hot flashes, but they are tolerable. She had both of her ovaries removed. She has some infected spots on the back of her head that have been there for about 2 weeks. She puts some heat on the spots during the day. Today while getting treatment, she had a burning pain in her port. She has never had this happen  before. Denies diarrhea, or any other concerns. On Sunday a Pitbull lunged at her and she fell backwards. She did not hit her head but went to the ED for lower back pain.   She has two children and 4 grandchildren. She has had genetic testing and it was negative. She lives at home with her husband. She is very active. Her children live close to her.   MEDICAL HISTORY:  Past Medical History:  Diagnosis Date  . Abnormal Pap smear   . Anxiety   . Breast cancer ([place])   . Cancer ([place]) 1999   breast-s/p mastectomy, chemo, rad  . CIN I (cervical intraepithelial neoplasia I) 2003   by colpo  . Congenital deafness   . Deaf   . Depression   . Radiation 07/12/15-07/26/15   left femur 30 Gy  . S/P bilateral oophorectomy     SURGICAL HISTORY: Past Surgical History:  Procedure Laterality Date  . ABDOMINAL HYSTERECTOMY    . INTRAMEDULLARY (IM) NAIL INTERTROCHANTERIC Left 06/26/2015   Procedure: LEFT BIOMET LONG AFFIXS NAIL;  Surgeon: [name], MD;  Location: [facility];  Service: Orthopedics;  Laterality: Left;  . IR GENERIC HISTORICAL  08/06/2015   IR US GUIDE VASC ACCESS LEFT 08/06/2015 Aletta Edouard, MD [facility]  . IR GENERIC HISTORICAL  08/06/2015   IR FLUORO GUIDE CV LINE LEFT 08/06/2015 Aletta Edouard, MD [facility]  . IR GENERIC HISTORICAL  03/05/2016   IR CV LINE INJECTION 03/05/2016 Markus Daft, MD [facility]  . MASTECTOMY     right breast  . MASTECTOMY    . OVARIAN CYST REMOVAL    . RADIOLOGY WITH ANESTHESIA Left 06/25/2015   Procedure: MRI OF LEFT HIP WITH OR WITHOUT CONTRAST;  Surgeon: Medication Radiologist, MD;  Location: [facility];  Service: Radiology;  Laterality: Left;  DR. MCINTYRE/MRI  . TUBAL LIGATION      SOCIAL HISTORY: Social History   Social History  . Marital status: Married    Spouse name: N/A  . Number of children: 2  . Years of education: N/A   Occupational History  . Not on file.   Social History Main Topics  . Smoking status: Never Smoker  .  Smokeless tobacco: Never Used  . Alcohol use No  . Drug use: No  . Sexual activity: Yes    Birth control/ protection: Surgical   Other Topics Concern  . Not on file   Social History Narrative  . No narrative on file    FAMILY HISTORY: Family History  Problem Relation Age of Onset  . Hypertension Mother   . Seizures Mother   . Alzheimer's disease Maternal Uncle   . Pancreatitis Maternal Grandmother   . Heart attack Maternal Grandfather     ALLERGIES:  is allergic to chlorhexidine; promethazine hcl; and diphenhydramine hcl.  MEDICATIONS:  Current Outpatient Prescriptions  Medication Sig Dispense Refill  . Cholecalciferol 2000 units CAPS Take  1 capsule by mouth daily.    . divalproex (DEPAKOTE) 250 MG DR tablet Take 1 tablet (250 mg total) by mouth at bedtime. 30 tablet 2  . ibuprofen (ADVIL,MOTRIN) 200 MG tablet Take 200 mg by mouth every 6 (six) hours as needed for mild pain.    Marland Kitchen letrozole (FEMARA) 2.5 MG tablet Take 1 tablet (2.5 mg total) by mouth daily. 30 tablet 2  . lidocaine-prilocaine (EMLA) cream Apply 1 application topically as needed. Apply to Porta-Cath 1-2 hours prior to access as directed. 30 g 1  . Multiple Vitamin (MULTIVITAMIN) tablet Take 1 tablet by mouth daily.    Marland Kitchen oxyCODONE-acetaminophen (PERCOCET/ROXICET) 5-325 MG tablet Take 1 tablet by mouth every 4 (four) hours as needed. 20 tablet 0  . potassium chloride (K-DUR) 10 MEQ tablet Take 2 tablets (20 mEq total) by mouth daily. 60 tablet 1  . loperamide (IMODIUM A-D) 2 MG tablet Take 1-2 tablets after each loose stool. UP TO 6 IN  24 HRS. (Patient not taking: Reported on 01/16/2016) 30 tablet 1  . LORazepam (ATIVAN) 1 MG tablet Place one tablet under the tongue or swallow every 4-6 hours as needed for nausea. (THIS IS THE SAME STRENGTH AS 2 OF YOUR 0.5 MG TABLETS) (Patient not taking: Reported on 01/16/2016) 30 tablet 0  . ondansetron (ZOFRAN) 8 MG tablet Take 1 tablet (8 mg total) by mouth every 8 (eight) hours  as needed for nausea or vomiting. (Patient not taking: Reported on 01/16/2016) 30 tablet 0   No current facility-administered medications for this visit.    Facility-Administered Medications Ordered in Other Visits  Medication Dose Route Frequency Provider Last Rate Last Dose  . iopamidol (ISOVUE-300) 61 % injection           . iopamidol (ISOVUE-300) 61 % injection    PRN Markus Daft, MD   15 mL at 03/05/16 1028  . sodium chloride 0.9 % 1,000 mL with potassium chloride 10 mEq infusion   Intravenous Continuous Gordy Levan, MD   Stopped at 09/10/15 1653  . sodium chloride 0.9 % injection 10 mL  10 mL Intravenous PRN Lennis Marion Downer, MD        REVIEW OF SYSTEMS:   Constitutional: Denies fevers, chills or abnormal night sweats (+) hot flashes, port pain Eyes: Denies blurriness of vision, double vision or watery eyes Ears, nose, mouth, throat, and face: Denies mucositis or sore throat (+) deaf Respiratory: Denies cough, dyspnea or wheezes Cardiovascular: Denies palpitation, chest discomfort or lower extremity swelling Gastrointestinal:  Denies nausea, heartburn or change in bowel habits Skin: Denies abnormal skin rashes (+) infection on back of head Lymphatics: Denies new lymphadenopathy or easy bruising Neurological:Denies numbness, tingling or new weaknesses Behavioral/Psych: Mood is stable, no new changes  Musculoskeletal: (+) lower back pain  All other systems were reviewed with the patient and are negative.  PHYSICAL EXAMINATION: ECOG PERFORMANCE STATUS: 0 - Asymptomatic  Vitals:   03/05/16 1104  BP: 130/71  Pulse: (!) 59  Resp: 18  Temp: 97.6 F (36.4 C)   Filed Weights   03/05/16 1104  Weight: 129 lb 12.8 oz (58.9 kg)   GENERAL:alert, no distress and comfortable SKIN: skin color, texture, turgor are normal, no rashes or significant lesions EYES: normal, conjunctiva are pink and non-injected, sclera clear OROPHARYNX:no exudate, no erythema and lips, buccal mucosa, and  tongue normal  NECK: supple, thyroid normal size, non-tender, without nodularity LYMPH:  no palpable lymphadenopathy in the cervical, axillary or inguinal LUNGS: clear  to auscultation and percussion with normal breathing effort HEART: regular rate & rhythm and no murmurs and no lower extremity edema ABDOMEN:abdomen soft, non-tender and normal bowel sounds Musculoskeletal:no cyanosis of digits and no clubbing  PSYCH: alert & oriented x 3 with fluent speech NEURO: no focal motor/sensory deficits Breasts: Breast inspection showed Right breast is surgically absent status post reconstruction. Palpation of the breasts and axilla revealed no obvious mass that I could appreciate.   LABORATORY DATA:  I have reviewed the data as listed CBC Latest Ref Rng & Units 03/05/2016 02/06/2016 01/16/2016  WBC 3.9 - 10.3 10e3/uL 4.3 3.8(L) 4.3  Hemoglobin 11.6 - 15.9 g/dL 10.8(L) 12.1 11.1(L)  Hematocrit 34.8 - 46.6 % 31.5(L) 35.5 33.6(L)  Platelets 145 - 400 10e3/uL 178 179 182   CMP Latest Ref Rng & Units 03/05/2016 02/06/2016 01/16/2016  Glucose 70 - 140 mg/dl 108 88 95  BUN 7.0 - 26.0 mg/dL 9.4 7.3 5.9(L)  Creatinine 0.6 - 1.1 mg/dL 0.7 0.7 0.7  Sodium 136 - 145 mEq/L 141 140 142  Potassium 3.5 - 5.1 mEq/L 3.1(L) 3.6 3.2(L)  Chloride 101 - 111 mmol/L - - -  CO2 22 - 29 mEq/L _0 Calcium 8.4 - 10.4 mg/dL 8.8 9.0 8.6  Total Protein 6.4 - 8.3 g/dL 6.3(L) 6.7 6.0(L)  Total Bilirubin 0.20 - 1.20 mg/dL 1.03 1.24(H) 1.20  Alkaline Phos 40 - 150 U/L 57 61 70  AST 5 - 34 U/L _1 ALT 0 - 55 U/L _2 RADIOGRAPHIC STUDIES: I have personally reviewed the radiological images as listed and agreed with the findings in the report. Dg Lumbar Spine Complete  Result Date: 03/01/2016 CLINICAL DATA:  Patient status post fall. Lower back pain. Initial encounter. EXAM: LUMBAR SPINE - COMPLETE 4+ VIEW COMPARISON:  PET-CT 12/25/2015. FINDINGS: Normal anatomic alignment. No evidence for acute fracture or  dislocation. Preservation the vertebral body and intervertebral disc space heights. Lower lumbar spine facet degenerative changes. Postsurgical changes visualize left femur. SI joints are unremarkable. IMPRESSION: No acute appearing osseous lesion. Electronically Signed   By: Lovey Newcomer M.D.   On: 03/01/2016 19:17   Ir Cv Line Injection  Result Date: 03/05/2016 INDICATION: 51 year old with metastatic breast cancer. Patient is experiencing pain at the left chest Port-A-Cath site during infusion. Request for port injection and evaluation EXAM: PORT-A-CATH INJECTION WITH FLUOROSCOPY MEDICATIONS: None ANESTHESIA/SEDATION: None FLUOROSCOPY TIME:  6 seconds, 11.6 mGy CONTRAST:  15 mL [id number] COMPLICATIONS: None immediate. PROCEDURE: The left chest Port-A-Cath was already accessed when the patient arrived in Radiology. Contrast was injected through the Port-A-Cath with fluoroscopic guidance. Catheter was flushed at end of the procedure. FINDINGS: There is a left jugular Port-A-Cath and the tip is well positioned at the superior cavoatrial junction. There is no evidence for contrast extravasation or catheter discontinuity. Examination of the patient's skin demonstrates a healed incision at the left jugular insertion site. The skin around the port access needle was difficult to evaluate due to the bandage. IMPRESSION: Normal appearance of the left jugular Port-A-Cath. No evidence for contrast extravasation or port discontinuity. Catheter is ready for use. Electronically Signed   By: Markus Daft M.D.   On: 03/05/2016 14:00   ASSESSMENT & PLAN:  Mrs. Kirkendoll is a 51 y.o. postmenopause female with:  1. Metastatic ER+ HER 2+ right breast cancer involving bone -I reviewed her medical records extensively, and confirmed that he points with patient, sees the oncology history summary above. -  Restaging PET 12-25-15 markedly improved of bone metastasis after initial systemic chemo  -Letrozole begun 01-16-16, continue  herceptin and perjeta.  -Did not tolerate SQ xgeva and it was changed to zometa monthly. -Echocardiogram 01-27-16 showed normal EF, will continue monitoring when she is on herceptin -She had burning sensation at port with flush and iv saline. I reviewed the dye study for her port. No leaks present.  -Echo due in April 2018. She doesn't have a cardiologist.  -She is clinically doing well, we'll continue current treatment . Order PET scan at next visit.   2. History of MRSA skin infection scalp, boil at low back scalp  -Uses warm compress  -I have recommended neosporin.  -If it is not better in a week, she will call me.   3.congenital deafness -needs sign language interpreter for all visits.  -Husband also deaf.  4. premature surgical menopause -post BSO 204fr pelvic pain and ovarian cysts, likely was also beneficial from standpoint of the breast cancer. -hysterectomy.   5. Intermittent anemia  -mild, possible related to prior chemo -She is asymptomatic, we'll continue close monitoring  6. Hypokalemia  -K 3.1 today, she is on KCL KCL 254m daily, will increase to 4085mdaily for a week   7. Goal of care discussion  -We again discussed the incurable nature of her cancer, and the overall poor prognosis, especially if she does not have good response to chemotherapy or progress on chemo. We also discussed that she has had excellent response to chemo, and her disease may be controlled for long time. -The patient understands the goal of care is palliative. -she is full code for now   Plan: -I will write her a letter saying she is safe to travel on a plane.  -treatment today.  -Refill Depakote for her vertigo.  -I have cancelled her mammogram for March 7.  -Due to her travel in early March, we'll postpone her next treatment for a week. Return for f/u and treatment on March 23.   All questions were answered. The patient knows to call the clinic with any problems, questions or  concerns.  I spent 30 minutes counseling the patient face to face. The total time spent in the appointment was 40 minutes and more than 50% was on counseling.  This document serves as a record of services personally performed by YanTruitt MerleD. It was created on her behalf by [email or web address], a trained medical scribe. The creation of this record is based on the scribe's personal observations and the provider's statements to them. This document has been checked and approved by the attending provider.  I have reviewed the above documentation for accuracy and completeness and I agree with the above.   FenTruitt MerleD 03/05/2016 7:22 PM

## 2016-03-05 ENCOUNTER — Encounter: Payer: Self-pay | Admitting: Hematology

## 2016-03-05 ENCOUNTER — Encounter (HOSPITAL_COMMUNITY): Payer: Self-pay | Admitting: Diagnostic Radiology

## 2016-03-05 ENCOUNTER — Ambulatory Visit (HOSPITAL_COMMUNITY)
Admission: RE | Admit: 2016-03-05 | Discharge: 2016-03-05 | Disposition: A | Discharge status: Home / Self Care | Payer: Medicaid Other | Source: Ambulatory Visit | Attending: Hematology | Admitting: Hematology

## 2016-03-05 ENCOUNTER — Telehealth: Payer: Self-pay | Admitting: Hematology

## 2016-03-05 ENCOUNTER — Ambulatory Visit (HOSPITAL_BASED_OUTPATIENT_CLINIC_OR_DEPARTMENT_OTHER): Payer: Medicaid Other | Admitting: Hematology

## 2016-03-05 ENCOUNTER — Other Ambulatory Visit: Payer: Self-pay | Admitting: *Deleted

## 2016-03-05 ENCOUNTER — Telehealth: Payer: Self-pay | Admitting: *Deleted

## 2016-03-05 ENCOUNTER — Ambulatory Visit (HOSPITAL_BASED_OUTPATIENT_CLINIC_OR_DEPARTMENT_OTHER): Payer: Medicaid Other

## 2016-03-05 ENCOUNTER — Ambulatory Visit: Payer: Medicaid Other

## 2016-03-05 ENCOUNTER — Other Ambulatory Visit (HOSPITAL_BASED_OUTPATIENT_CLINIC_OR_DEPARTMENT_OTHER): Payer: Medicaid Other

## 2016-03-05 VITALS — BP 130/71 | HR 59 | Temp 97.6°F | Resp 18 | Ht 67.0 in | Wt 129.8 lb

## 2016-03-05 DIAGNOSIS — E28319 Asymptomatic premature menopause: Secondary | ICD-10-CM

## 2016-03-05 DIAGNOSIS — C7951 Secondary malignant neoplasm of bone: Secondary | ICD-10-CM

## 2016-03-05 DIAGNOSIS — C50911 Malignant neoplasm of unspecified site of right female breast: Secondary | ICD-10-CM

## 2016-03-05 DIAGNOSIS — Z95828 Presence of other vascular implants and grafts: Secondary | ICD-10-CM | POA: Diagnosis not present

## 2016-03-05 DIAGNOSIS — L02821 Furuncle of head [any part, except face]: Secondary | ICD-10-CM

## 2016-03-05 DIAGNOSIS — Z7189 Other specified counseling: Secondary | ICD-10-CM | POA: Insufficient documentation

## 2016-03-05 DIAGNOSIS — Z170 Estrogen receptor positive status [ER+]: Secondary | ICD-10-CM

## 2016-03-05 DIAGNOSIS — E871 Hypo-osmolality and hyponatremia: Secondary | ICD-10-CM

## 2016-03-05 DIAGNOSIS — Z1379 Encounter for other screening for genetic and chromosomal anomalies: Secondary | ICD-10-CM

## 2016-03-05 DIAGNOSIS — C50919 Malignant neoplasm of unspecified site of unspecified female breast: Secondary | ICD-10-CM | POA: Insufficient documentation

## 2016-03-05 DIAGNOSIS — Z5112 Encounter for antineoplastic immunotherapy: Secondary | ICD-10-CM | POA: Diagnosis present

## 2016-03-05 DIAGNOSIS — C799 Secondary malignant neoplasm of unspecified site: Principal | ICD-10-CM

## 2016-03-05 DIAGNOSIS — D649 Anemia, unspecified: Secondary | ICD-10-CM | POA: Diagnosis not present

## 2016-03-05 DIAGNOSIS — H905 Unspecified sensorineural hearing loss: Secondary | ICD-10-CM | POA: Diagnosis not present

## 2016-03-05 HISTORY — PX: IR GENERIC HISTORICAL: IMG1180011

## 2016-03-05 LAB — COMPREHENSIVE METABOLIC PANEL
ALK PHOS: 57 U/L (ref 40–150)
ALT: 7 U/L (ref 0–55)
AST: 8 U/L (ref 5–34)
Albumin: 3.3 g/dL — ABNORMAL LOW (ref 3.5–5.0)
Anion Gap: 9 mEq/L (ref 3–11)
BILIRUBIN TOTAL: 1.03 mg/dL (ref 0.20–1.20)
BUN: 9.4 mg/dL (ref 7.0–26.0)
CO2: 24 mEq/L (ref 22–29)
CREATININE: 0.7 mg/dL (ref 0.6–1.1)
Calcium: 8.8 mg/dL (ref 8.4–10.4)
Chloride: 107 mEq/L (ref 98–109)
GLUCOSE: 108 mg/dL (ref 70–140)
Potassium: 3.1 mEq/L — ABNORMAL LOW (ref 3.5–5.1)
SODIUM: 141 meq/L (ref 136–145)
TOTAL PROTEIN: 6.3 g/dL — AB (ref 6.4–8.3)

## 2016-03-05 LAB — CBC WITH DIFFERENTIAL/PLATELET
BASO%: 0.5 % (ref 0.0–2.0)
Basophils Absolute: 0 10*3/uL (ref 0.0–0.1)
EOS ABS: 0.3 10*3/uL (ref 0.0–0.5)
EOS%: 5.8 % (ref 0.0–7.0)
HCT: 31.5 % — ABNORMAL LOW (ref 34.8–46.6)
HEMOGLOBIN: 10.8 g/dL — AB (ref 11.6–15.9)
LYMPH%: 31.3 % (ref 14.0–49.7)
MCH: 29 pg (ref 25.1–34.0)
MCHC: 34.3 g/dL (ref 31.5–36.0)
MCV: 84.5 fL (ref 79.5–101.0)
MONO#: 0.3 10*3/uL (ref 0.1–0.9)
MONO%: 7.6 % (ref 0.0–14.0)
NEUT%: 54.8 % (ref 38.4–76.8)
NEUTROS ABS: 2.4 10*3/uL (ref 1.5–6.5)
Platelets: 178 10*3/uL (ref 145–400)
RBC: 3.73 10*6/uL (ref 3.70–5.45)
RDW: 12.5 % (ref 11.2–14.5)
WBC: 4.3 10*3/uL (ref 3.9–10.3)
lymph#: 1.4 10*3/uL (ref 0.9–3.3)

## 2016-03-05 MED ORDER — ACETAMINOPHEN 325 MG PO TABS
ORAL_TABLET | ORAL | Status: AC
  Filled 2016-03-05: qty 2

## 2016-03-05 MED ORDER — IOPAMIDOL (ISOVUE-300) INJECTION 61%
INTRAVENOUS | Status: DC | PRN
  Administered 2016-03-05: 15 mL via INTRAVENOUS

## 2016-03-05 MED ORDER — LORATADINE 10 MG PO TABS
10.0000 mg | ORAL_TABLET | Freq: Every day | ORAL | Status: DC
  Administered 2016-03-05: 10 mg via ORAL
  Filled 2016-03-05: qty 1

## 2016-03-05 MED ORDER — ACETAMINOPHEN 325 MG PO TABS
650.0000 mg | ORAL_TABLET | Freq: Once | ORAL | Status: AC
Start: 2016-03-05 — End: 2016-03-05
  Administered 2016-03-05: 650 mg via ORAL

## 2016-03-05 MED ORDER — SODIUM CHLORIDE 0.9 % IV SOLN
Freq: Once | INTRAVENOUS | Status: AC
  Administered 2016-03-05: 13:00:00 via INTRAVENOUS

## 2016-03-05 MED ORDER — ZOLEDRONIC ACID 4 MG/100ML IV SOLN
4.0000 mg | Freq: Once | INTRAVENOUS | Status: AC
  Administered 2016-03-05: 4 mg via INTRAVENOUS
  Filled 2016-03-05: qty 100

## 2016-03-05 MED ORDER — IOPAMIDOL (ISOVUE-300) INJECTION 61%
INTRAVENOUS | Status: AC
  Filled 2016-03-05: qty 50

## 2016-03-05 MED ORDER — SODIUM CHLORIDE 0.9 % IV SOLN
210.0000 mg | Freq: Once | INTRAVENOUS | Status: AC
  Administered 2016-03-05: 210 mg via INTRAVENOUS
  Filled 2016-03-05: qty 7

## 2016-03-05 MED ORDER — SODIUM CHLORIDE 0.9 % IJ SOLN
10.0000 mL | Freq: Once | INTRAMUSCULAR | Status: AC
  Administered 2016-03-05: 10 mL via INTRAVENOUS
  Filled 2016-03-05: qty 10

## 2016-03-05 MED ORDER — DIVALPROEX SODIUM 250 MG PO DR TAB: 250.0000 mg | DELAYED_RELEASE_TABLET | Freq: Every day | ORAL | 2 refills | Status: DC

## 2016-03-05 MED ORDER — TRASTUZUMAB CHEMO 150 MG IV SOLR
6.0000 mg/kg | Freq: Once | INTRAVENOUS | Status: AC
  Administered 2016-03-05: 357 mg via INTRAVENOUS
  Filled 2016-03-05: qty 17

## 2016-03-05 NOTE — Patient Instructions (Signed)

## 2016-03-05 NOTE — Telephone Encounter (Signed)
Lab, flush, chemo and follow up appointments scheduled per 03/05/16 los. Mammo cancelled per 03/05/16 los. Patient given a copy of the AVS report and appointment schedule pe r02/22/18 los.

## 2016-03-05 NOTE — Progress Notes (Signed)
Patient complains of pain in tubing at neck when flushing port with her lying flat. Blood return noted after multiple position changes. After blood collected for labs and normal saline administered patient complained of burning near port. Dr. Burr Medico notified.

## 2016-03-05 NOTE — Telephone Encounter (Signed)
Called & spoke to IR regarding port study.  Informed that pt does not have a port problem & pictures done at different angles & no abnormality seen.  Informed that pt c/o burning with flushes & was not it is not a port problem.

## 2016-03-05 NOTE — Patient Instructions (Addendum)
[facility] Discharge Instructions for Patients Receiving Chemotherapy  Today you received the following chemotherapy agents: Herceptin and Perjeta   To help prevent nausea and vomiting after your treatment, we encourage you to take your nausea medication as directed.    If you develop nausea and vomiting that is not controlled by your nausea medication, call the clinic.   BELOW ARE SYMPTOMS THAT SHOULD BE REPORTED IMMEDIATELY:  *FEVER GREATER THAN 100.5 F  *CHILLS WITH OR WITHOUT FEVER  NAUSEA AND VOMITING THAT IS NOT CONTROLLED WITH YOUR NAUSEA MEDICATION  *UNUSUAL SHORTNESS OF BREATH  *UNUSUAL BRUISING OR BLEEDING  TENDERNESS IN MOUTH AND THROAT WITH OR WITHOUT PRESENCE OF ULCERS  *URINARY PROBLEMS  *BOWEL PROBLEMS  UNUSUAL RASH Items with * indicate a potential emergency and should be followed up as soon as possible.  Feel free to call the clinic you have any questions or concerns. The clinic phone number is [phone].  Please show the CHEMO ALERT CARD at check-in to the Emergency Department and triage nurse.  Zoledronic Acid injection (Hypercalcemia, Oncology) What is this medicine? ZOLEDRONIC ACID (ZOE le dron ik AS id) lowers the amount of calcium loss from bone. It is used to treat too much calcium in your blood from cancer. It is also used to prevent complications of cancer that has spread to the bone. This medicine may be used for other purposes; ask your health care provider or pharmacist if you have questions. COMMON BRAND NAME(S): Zometa What should I tell my health care provider before I take this medicine? They need to know if you have any of these conditions: -aspirin-sensitive asthma -cancer, especially if you are receiving medicines used to treat cancer -dental disease or wear dentures -infection -kidney disease -receiving corticosteroids like dexamethasone or prednisone -an unusual or allergic reaction to zoledronic acid, other  medicines, foods, dyes, or preservatives -pregnant or trying to get pregnant -breast-feeding How should I use this medicine? This medicine is for infusion into a vein. It is given by a health care professional in a hospital or clinic setting. Talk to your pediatrician regarding the use of this medicine in children. Special care may be needed. Overdosage: If you think you have taken too much of this medicine contact a poison control center or emergency room at once. NOTE: This medicine is only for you. Do not share this medicine with others. What if I miss a dose? It is important not to miss your dose. Call your doctor or health care professional if you are unable to keep an appointment. What may interact with this medicine? -certain antibiotics given by injection -NSAIDs, medicines for pain and inflammation, like ibuprofen or naproxen -some diuretics like bumetanide, furosemide -teriparatide -thalidomide This list may not describe all possible interactions. Give your health care provider a list of all the medicines, herbs, non-prescription drugs, or dietary supplements you use. Also tell them if you smoke, drink alcohol, or use illegal drugs. Some items may interact with your medicine. What should I watch for while using this medicine? Visit your doctor or health care professional for regular checkups. It may be some time before you see the benefit from this medicine. Do not stop taking your medicine unless your doctor tells you to. Your doctor may order blood tests or other tests to see how you are doing. Women should inform their doctor if they wish to become pregnant or think they might be pregnant. There is a potential for serious side effects to an unborn   child. Talk to your health care professional or pharmacist for more information. You should make sure that you get enough calcium and vitamin D while you are taking this medicine. Discuss the foods you eat and the vitamins you take with  your health care professional. Some people who take this medicine have severe bone, joint, and/or muscle pain. This medicine may also increase your risk for jaw problems or a broken thigh bone. Tell your doctor right away if you have severe pain in your jaw, bones, joints, or muscles. Tell your doctor if you have any pain that does not go away or that gets worse. Tell your dentist and dental surgeon that you are taking this medicine. You should not have major dental surgery while on this medicine. See your dentist to have a dental exam and fix any dental problems before starting this medicine. Take good care of your teeth while on this medicine. Make sure you see your dentist for regular follow-up appointments. What side effects may I notice from receiving this medicine? Side effects that you should report to your doctor or health care professional as soon as possible: -allergic reactions like skin rash, itching or hives, swelling of the face, lips, or tongue -anxiety, confusion, or depression -breathing problems -changes in vision -eye pain -feeling faint or lightheaded, falls -jaw pain, especially after dental work -mouth sores -muscle cramps, stiffness, or weakness -redness, blistering, peeling or loosening of the skin, including inside the mouth -trouble passing urine or change in the amount of urine Side effects that usually do not require medical attention (report to your doctor or health care professional if they continue or are bothersome): -bone, joint, or muscle pain -constipation -diarrhea -fever -hair loss -irritation at site where injected -loss of appetite -nausea, vomiting -stomach upset -trouble sleeping -trouble swallowing -weak or tired This list may not describe all possible side effects. Call your doctor for medical advice about side effects. You may report side effects to FDA at 1-800-FDA-1088. Where should I keep my medicine? This drug is given in a hospital or  clinic and will not be stored at home. NOTE: This sheet is a summary. It may not cover all possible information. If you have questions about this medicine, talk to your doctor, pharmacist, or health care provider.  2017 [employer] (2013-05-27 14:19:39)      

## 2016-03-05 NOTE — Telephone Encounter (Signed)
Per 2/22 LOS I have scheduled appts

## 2016-03-06 LAB — CANCER ANTIGEN 27.29: CA 27.29: 37.9 U/mL (ref 0.0–38.6)

## 2016-03-06 NOTE — Telephone Encounter (Signed)
Discussed port issue with Dr Burr Medico & OK to try port again next time & if burning again with flush, should not use & will need to have new port or have radiology re-look at this.

## 2016-03-18 ENCOUNTER — Ambulatory Visit: Payer: No Typology Code available for payment source

## 2016-03-31 NOTE — Progress Notes (Signed)
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 She had mastectomy with the 7 axillary node evaluation, 4 cycles of adriamycin/ cytoxan followed by taxotere, then five years of tamoxifen thru June 2005 (no herceptin in 1999). She had bilateral oophorectomy in May 2005. She was briefly on aromatase inhibitor after tamoxifen, but discontinued this herself due to poor tolerance.      04/04/2015 Genetic Testing    Negative genetic testing on the breast/ovarian cancer pnael.  The Breast/Ovarian gene panel offered by [facility] includes sequencing and rearrangement analysis for the following 20 genes:  ATM, BARD1, BRCA1, BRCA2, BRIP1, CDH1, CHEK2, EPCAM, FANCC, MLH1, MSH2, MSH6, NBN, PALB2, PMS2, PTEN, RAD51C, RAD51D, TP53, and XRCC2.      06/26/2015 Pathology Results    Initial Path Report Bone, curettage, Left femur met, intramedullary subtroch tissue - METASTATIC ADENOCARCINOMA.  Estrogen Receptor: 95%, POSITIVE, STRONG STAINING INTENSITY Progesterone Receptor: 2%, POSITIVE, STRONG       06/26/2015 Surgery    Biopsy of metastatic tissue and stabilization with Affixus trochanteric nail, proximal and distal interlock of left femur by Dr. [name]       06/28/2015 Imaging    CT Chest w/ Contrast 1. Extensive right internal mammary chain lymphadenopathy consistent with metastatic breast cancer. 2. Lytic metastases involving the posterior elements at T1 with probable nondisplaced pathologic fracture of the spinous process. 3. No other evidence of  thoracic metastatic disease. 4. Trace bilateral pleural effusions with associated bibasilar atelectasis. 5. Indeterminate right thyroid nodule, not previously imaged. This could be evaluated with thyroid ultrasound as clinically warranted.      07/01/2015 Imaging    Bone Scan 1. Increased activity noted throughout the left femur. Although metastatic disease cannot be excluded, these changes are most likely secondary to prior surgery. 2. No other focal abnormalities identified to suggest metastatic disease.      07/12/2015 - 07/26/2015 Radiation Therapy    Left femur, 30 Gy in 10 fractions by Dr. Sondra Come       07/14/2015 Initial Diagnosis    [name], right ([place])     07/19/2015 Imaging    Initial PET Scan 1. Severe multifocal osseous metastatic disease, much greater than anticipated based on prior imaging. 2. Multifocal nodal metastases involving the right internal mammary, prevascular and subpectoral lymph nodes. No axillary pulmonary involvement identified. 3. No distant extra osseous metastases.      07/30/2015 - 11/29/2015 Chemotherapy    Docetaxel, Herceptin and Perjeta every 3 weeks X6 cycles, pt tolerated moderately well, restaging scan showed excellent response       09/18/2015 Imaging    CT Head w/wo Contrast 1. No acute intracranial abnormality or significant interval change. 2. Stable minimal periventricular white matter hypoattenuation on the right. This may reflect a remote ischemic injury. 3. No evidence for metastatic disease to the brain. 4. No focal soft tissue lesion to explain the patient's tenderness.      10/30/2015 Imaging    CT Abdomen Pelvis w/ Contrast 1. Few mildly prominent  fluid-filled loops of small bowel scattered throughout the abdomen, with intraluminal fluid density within the distal colon. Given the provided history, findings are suggestive of acute enteritis/diarrheal illness. 2. No other acute intra-abdominal or pelvic  process identified. 3. Widespread osseous metastatic disease, better evaluated on most recent PET-CT from 07/19/2015. No pathologic fracture or other complication. 4. 11 mm hypodensity within the left kidney. This lesion measures intermediate density, and is indeterminate. While this lesion is similar in size relative to recent studies, this is increased in size relative to prior study from 2012. Further evaluation with dedicated renal mass protocol CT and/or MRI is recommended for complete characterization.      12/25/2015 Imaging    Restaging PET Scan 1. Markedly improved skeletal activity, with only several faint foci of residual accentuated metabolic activity, but resolution of the vast majority of the previously extensive osseous metastatic disease. 2. Resolution of the prior right internal mammary and right prevascular lymph nodes. 3. Residual subcutaneous edema and edema along fascia planes in the left upper thigh. This remains somewhat more than I would expect for placement of an IM nail 6 weeks ago. If there is leg swelling further down, consider Doppler venous ultrasound to rule out left lower extremity DVT. I do not perceive an obvious difference in density between the pelvic and common femoral veins on the noncontrast CT data. 4. Chronic right maxillary and right sphenoid sinusitis.      12/2015 -  Chemotherapy    Maintenance Herceptin and pejeta every 3 weeks      01/16/2016 -  Anti-estrogen oral therapy    Letrozole 2.5 mg daily       HISTORY OF PRESENTING ILLNESS (From Dr. Mariana Kaufman note on 02/06/2016):  [name] 51 y.o. 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 She has had an excellent response to 6 cycles of taxotere herceptin perjeta from 07-30-15 thru 11-29-15. She is continuing herceptin and perjeta every 3 weeks. She had extreme local pain with first xgeva injection  on 01-16-16 and prefers resuming previous zometa. She began letrozole ~ 01-16-16, has no complaints about this at all.  Last imaging was PET 12-25-15.  NOTE CTs, plain Xrays and bone scan did NOT show any of the extensively metastatic bone involvement 06-2015; MRI and PET did image disease clearly.  Patient continues to do very well overall. MRSA skin lesions on scalp and face improved quickly with doxycycline starting 01-16-16. She continues to use Dial soap and we have again strongly encouraged good hygiene with other family members at home. She has had the 3 lymphedema PT sessions allowed by insurance, continues to do massage for LLE herself, still has not gotten the ordered compression apparatus for home and will follow up with PT on that. Swelling in LLE has improved with the interventions and does not seem as uncomfortable. She denies any pain. Appetite is excellent, bowels fine, no SOB, good energy, no problems with PAC, no noted changes in left breast. No bleeding. No fever or symptoms of infection. She slipped in tub last week, fell onto right arm fortunately without injury. She was upset by a visitor and shaky then, but no tremors otherwise.   She has two children and 4 grandchildren. She has had genetic testing and it was negative. She lives at home with her husband. She is very active. Her children live close to her.   CURRENT THERAPY:  Letrozole 2.40m daily,  Herceptin and Perjeta every 3 weeks   INTERIM HISTORY: He returns today for follow up. She has been previously seen by Dr. Marko Plume, who has retired lately. Pt is deaf and speaks through a sign language interpreter. Her husband was also present and he is also deaf. She reports her left foot is numb. She had surgery in June 2017. The numbness just started when she was on a trip a few weeks. Denies numbness or tingling in other places of her body. She has been taking her Letrozole and has been doing well. She has been having hot flashes, but  they are tolerable. She had both of her ovaries removed. She reports eating well. She does not have lower back pain. She is able to sleep on all sides of her body. She reports her port is uncomfortable. She is only taking 1 tablet of potassium a day.  MEDICAL HISTORY:  Past Medical History:  Diagnosis Date  . Abnormal Pap smear   . Anxiety   . Breast cancer ([place])   . Cancer ([place]) 1999   breast-s/p mastectomy, chemo, rad  . CIN I (cervical intraepithelial neoplasia I) 2003   by colpo  . Congenital deafness   . Deaf   . Depression   . Radiation 07/12/15-07/26/15   left femur 30 Gy  . S/P bilateral oophorectomy     SURGICAL HISTORY: Past Surgical History:  Procedure Laterality Date  . ABDOMINAL HYSTERECTOMY    . INTRAMEDULLARY (IM) NAIL INTERTROCHANTERIC Left 06/26/2015   Procedure: LEFT BIOMET LONG AFFIXS NAIL;  Surgeon: [name], MD;  Location: [facility];  Service: Orthopedics;  Laterality: Left;  . IR GENERIC HISTORICAL  08/06/2015   IR US GUIDE VASC ACCESS LEFT 08/06/2015 Aletta Edouard, MD [facility]  . IR GENERIC HISTORICAL  08/06/2015   IR FLUORO GUIDE CV LINE LEFT 08/06/2015 Aletta Edouard, MD [facility]  . IR GENERIC HISTORICAL  03/05/2016   IR CV LINE INJECTION 03/05/2016 Markus Daft, MD [facility]  . MASTECTOMY     right breast  . MASTECTOMY    . OVARIAN CYST REMOVAL    . RADIOLOGY WITH ANESTHESIA Left 06/25/2015   Procedure: MRI OF LEFT HIP WITH OR WITHOUT CONTRAST;  Surgeon: Medication Radiologist, MD;  Location: [facility];  Service: Radiology;  Laterality: Left;  DR. MCINTYRE/MRI  . TUBAL LIGATION      SOCIAL HISTORY: Social History   Social History  . Marital status: Married    Spouse name: N/A  . Number of children: 2  . Years of education: N/A   Occupational History  . Not on file.   Social History Main Topics  . Smoking status: Never Smoker  . Smokeless tobacco: Never Used  . Alcohol use No  . Drug use: No  . Sexual activity: Yes    Birth control/  protection: Surgical   Other Topics Concern  . Not on file   Social History Narrative  . No narrative on file    FAMILY HISTORY: Family History  Problem Relation Age of Onset  . Hypertension Mother   . Seizures Mother   . Alzheimer's disease Maternal Uncle   . Pancreatitis Maternal Grandmother   . Heart attack Maternal Grandfather     ALLERGIES:  is allergic to chlorhexidine; promethazine hcl; and diphenhydramine hcl.  MEDICATIONS:  Current Outpatient Prescriptions  Medication Sig Dispense Refill  . Cholecalciferol 2000 units CAPS Take 1 capsule by mouth daily.    . divalproex (DEPAKOTE) 250 MG DR tablet Take  1 tablet (250 mg total) by mouth at bedtime. 30 tablet 2  . letrozole (FEMARA) 2.5 MG tablet Take 1 tablet (2.5 mg total) by mouth daily. 30 tablet 2  . lidocaine-prilocaine (EMLA) cream Apply 1 application topically as needed. Apply to Porta-Cath 1-2 hours prior to access as directed. 30 g 1  . potassium chloride (K-DUR) 10 MEQ tablet Take 2 tablets (20 mEq total) by mouth daily. 60 tablet 1  . loperamide (IMODIUM A-D) 2 MG tablet Take 1-2 tablets after each loose stool. UP TO 6 IN  24 HRS. (Patient not taking: Reported on 01/16/2016) 30 tablet 1  . LORazepam (ATIVAN) 1 MG tablet Place one tablet under the tongue or swallow every 4-6 hours as needed for nausea. (THIS IS THE SAME STRENGTH AS 2 OF YOUR 0.5 MG TABLETS) (Patient not taking: Reported on 01/16/2016) 30 tablet 0  . ondansetron (ZOFRAN) 8 MG tablet Take 1 tablet (8 mg total) by mouth every 8 (eight) hours as needed for nausea or vomiting. (Patient not taking: Reported on 01/16/2016) 30 tablet 0   No current facility-administered medications for this visit.    Facility-Administered Medications Ordered in Other Visits  Medication Dose Route Frequency Provider Last Rate Last Dose  . loratadine (CLARITIN) tablet 10 mg  10 mg Oral Daily Truitt Merle, MD   10 mg at 04/03/16 1213  . sodium chloride 0.9 % 1,000 mL with potassium  chloride 10 mEq infusion   Intravenous Continuous Gordy Levan, MD   Stopped at 09/10/15 1653  . sodium chloride 0.9 % injection 10 mL  10 mL Intravenous PRN Lennis P Livesay, MD      . sodium chloride flush (NS) 0.9 % injection 10 mL  10 mL Intracatheter PRN Truitt Merle, MD   10 mL at 04/03/16 1440    REVIEW OF SYSTEMS:   Constitutional: Denies fevers, chills or abnormal night sweats (+) hot flashes (+) Uncomfortable port site Eyes: Denies blurriness of vision, double vision or watery eyes Ears, nose, mouth, throat, and face: Denies mucositis or sore throat (+) deaf Respiratory: Denies cough, dyspnea or wheezes Cardiovascular: Denies palpitation, chest discomfort or lower extremity swelling Gastrointestinal:  Denies nausea, heartburn or change in bowel habits Skin: Denies abnormal skin rashes Lymphatics: Denies new lymphadenopathy or easy bruising Neurological: (+) Left foot numbness. Behavioral/Psych: Mood is stable, no new changes  All other systems were reviewed with the patient and are negative.  PHYSICAL EXAMINATION: ECOG PERFORMANCE STATUS: 0 - Asymptomatic  Vitals:   04/03/16 1049  BP: 126/65  Pulse: 67  Resp: 18  Temp: 97.7 F (36.5 C)   Filed Weights   04/03/16 1049  Weight: 131 lb 14.4 oz (59.8 kg)   GENERAL:alert, no distress and comfortable SKIN: skin color, texture, turgor are normal, no rashes or significant lesions EYES: normal, conjunctiva are pink and non-injected, sclera clear OROPHARYNX:no exudate, no erythema and lips, buccal mucosa, and tongue normal  NECK: supple, thyroid normal size, non-tender, without nodularity LYMPH:  no palpable lymphadenopathy in the cervical, axillary or inguinal LUNGS: clear to auscultation and percussion with normal breathing effort HEART: regular rate & rhythm and no murmurs and no lower extremity edema ABDOMEN:abdomen soft, non-tender and normal bowel sounds Musculoskeletal:no cyanosis of digits and no clubbing  PSYCH:  alert & oriented x 3 with fluent speech NEURO: no focal motor/sensory deficits Breasts: Breast inspection showed Right breast is surgically absent status post reconstruction. Palpation of the breasts and axilla revealed no obvious mass that I could  appreciate.   LABORATORY DATA:  I have reviewed the data as listed CBC Latest Ref Rng & Units 04/03/2016 03/05/2016 02/06/2016  WBC 3.9 - 10.3 10e3/uL 4.9 4.3 3.8(L)  Hemoglobin 11.6 - 15.9 g/dL 11.3(L) 10.8(L) 12.1  Hematocrit 34.8 - 46.6 % 32.9(L) 31.5(L) 35.5  Platelets 145 - 400 10e3/uL 229 178 179   CMP Latest Ref Rng & Units 04/03/2016 03/05/2016 02/06/2016  Glucose 70 - 140 mg/dl 102 108 88  BUN 7.0 - 26.0 mg/dL 6.0(L) 9.4 7.3  Creatinine 0.6 - 1.1 mg/dL 0.8 0.7 0.7  Sodium 136 - 145 mEq/L 141 141 140  Potassium 3.5 - 5.1 mEq/L 3.2(L) 3.1(L) 3.6  Chloride 101 - 111 mmol/L - - -  CO2 22 - 29 mEq/L _0 Calcium 8.4 - 10.4 mg/dL 8.7 8.8 9.0  Total Protein 6.4 - 8.3 g/dL 6.6 6.3(L) 6.7  Total Bilirubin 0.20 - 1.20 mg/dL 1.26(H) 1.03 1.24(H)  Alkaline Phos 40 - 150 U/L 66 57 61  AST 5 - 34 U/L _1 ALT 0 - 55 U/L _2 RADIOGRAPHIC STUDIES: I have personally reviewed the radiological images as listed and agreed with the findings in the report. Ir Cv Line Injection  Result Date: 03/05/2016 INDICATION: 51 year old with metastatic breast cancer. Patient is experiencing pain at the left chest Port-A-Cath site during infusion. Request for port injection and evaluation EXAM: PORT-A-CATH INJECTION WITH FLUOROSCOPY MEDICATIONS: None ANESTHESIA/SEDATION: None FLUOROSCOPY TIME:  6 seconds, 11.6 mGy CONTRAST:  15 mL [id number] COMPLICATIONS: None immediate. PROCEDURE: The left chest Port-A-Cath was already accessed when the patient arrived in Radiology. Contrast was injected through the Port-A-Cath with fluoroscopic guidance. Catheter was flushed at end of the procedure. FINDINGS: There is a left jugular Port-A-Cath and the tip is well  positioned at the superior cavoatrial junction. There is no evidence for contrast extravasation or catheter discontinuity. Examination of the patient's skin demonstrates a healed incision at the left jugular insertion site. The skin around the port access needle was difficult to evaluate due to the bandage. IMPRESSION: Normal appearance of the left jugular Port-A-Cath. No evidence for contrast extravasation or port discontinuity. Catheter is ready for use. Electronically Signed   By: Markus Daft M.D.   On: 03/05/2016 14:00   ASSESSMENT & PLAN:  [name] is a 51 y.o. postmenopause female with:  1. Metastatic ER+ HER 2+ right breast cancer involving bone -I previously reviewed her medical records extensively, and confirmed that he points with patient, sees the oncology history summary above. -Restaging PET 12-25-15 markedly improved of bone metastasis after initial systemic chemo  -Letrozole begun 01-16-16, continue herceptin and perjeta.  -Did not tolerate SQ xgeva and it was changed to zometa monthly. -Echocardiogram 01-27-16 showed normal EF, will continue monitoring when she is on herceptin -She had burning sensation at port with flush and iv saline. I reviewed the dye study for her port. No leaks present.  -Echo due in April 2018. She doesn't have a cardiologist.  -She is clinically doing well, we'll continue current treatment. -Restaging PET scan within the next 3 weeks.  2. History of MRSA skin infection scalp, boil at low back scalp  -Uses warm compress  -I have recommended neosporin.  -If it is not better in a week, she will call me. -It has improved.  3.congenital deafness -needs sign language interpreter for all visits.  -Husband also deaf.  4. premature surgical menopause -post BSO 2050fr pelvic pain and ovarian cysts,  likely was also beneficial from standpoint of the breast cancer. -hysterectomy.   5. Intermittent anemia  -mild, possible related to prior chemo -She is  asymptomatic, we'll continue close monitoring  6. Hypokalemia  -K 3.1 on 03/05/16, she is on KCL KCL 48mq daily, will increase to 450m daily for a week. -K 3.2 on 04/03/16. The patient is on KCl 2039mdaily, she will increast to 42m63maily for a week and then take 1 pill a day as normal.  7. Goal of care discussion  -We again discussed the incurable nature of her cancer, and the overall poor prognosis, especially if she does not have good response to chemotherapy or progress on chemo. We also discussed that she has had excellent response to chemo, and her disease may be controlled for long time. -The patient understands the goal of care is palliative. -she is full code for now   Plan: -Herceptin+Perjeta infusion today. -Lab, flush, f/u and herceptin+perjeta in 3 weeks. -PET scan before her next visit. -I refilled her letrozole, potassium, and emla cream. -Take 2 tablets of potassium a day for 1 week and then 1 tablet a day as usual. -I will order an echocardiogram for April.  All questions were answered. The patient knows to call the clinic with any problems, questions or concerns.  I spent 25 minutes counseling the patient face to face. The total time spent in the appointment was 30 minutes and more than 50% was on counseling.   FengTruitt [name] 04/03/2016 5:36 PM  This document serves as a record of services personally performed by [name]. It was created on her behalf by JalaDarcus Austintrained medical scribe. The creation of this record is based on the scribe's personal observations and the provider's statements to them. This document has been checked and approved by the attending provider.

## 2016-04-03 ENCOUNTER — Telehealth: Payer: Self-pay | Admitting: Hematology

## 2016-04-03 ENCOUNTER — Ambulatory Visit (HOSPITAL_BASED_OUTPATIENT_CLINIC_OR_DEPARTMENT_OTHER): Payer: Medicaid Other

## 2016-04-03 ENCOUNTER — Ambulatory Visit (HOSPITAL_BASED_OUTPATIENT_CLINIC_OR_DEPARTMENT_OTHER): Payer: Medicaid Other | Admitting: Hematology

## 2016-04-03 ENCOUNTER — Ambulatory Visit: Payer: Medicaid Other

## 2016-04-03 ENCOUNTER — Other Ambulatory Visit (HOSPITAL_BASED_OUTPATIENT_CLINIC_OR_DEPARTMENT_OTHER): Payer: Medicaid Other

## 2016-04-03 ENCOUNTER — Encounter: Payer: Self-pay | Admitting: Hematology

## 2016-04-03 VITALS — BP 126/65 | HR 67 | Temp 97.7°F | Resp 18 | Ht 67.0 in | Wt 131.9 lb

## 2016-04-03 VITALS — BP 123/68 | HR 63 | Temp 97.8°F | Resp 18

## 2016-04-03 DIAGNOSIS — C50911 Malignant neoplasm of unspecified site of right female breast: Secondary | ICD-10-CM

## 2016-04-03 DIAGNOSIS — Z79811 Long term (current) use of aromatase inhibitors: Secondary | ICD-10-CM

## 2016-04-03 DIAGNOSIS — E876 Hypokalemia: Secondary | ICD-10-CM | POA: Diagnosis not present

## 2016-04-03 DIAGNOSIS — C7951 Secondary malignant neoplasm of bone: Secondary | ICD-10-CM

## 2016-04-03 DIAGNOSIS — Z5112 Encounter for antineoplastic immunotherapy: Secondary | ICD-10-CM

## 2016-04-03 DIAGNOSIS — C50919 Malignant neoplasm of unspecified site of unspecified female breast: Secondary | ICD-10-CM

## 2016-04-03 DIAGNOSIS — C799 Secondary malignant neoplasm of unspecified site: Principal | ICD-10-CM

## 2016-04-03 DIAGNOSIS — Z95828 Presence of other vascular implants and grafts: Secondary | ICD-10-CM

## 2016-04-03 DIAGNOSIS — Z170 Estrogen receptor positive status [ER+]: Secondary | ICD-10-CM | POA: Diagnosis not present

## 2016-04-03 LAB — CBC WITH DIFFERENTIAL/PLATELET
BASO%: 0.6 % (ref 0.0–2.0)
BASOS ABS: 0 10*3/uL (ref 0.0–0.1)
EOS%: 5.5 % (ref 0.0–7.0)
Eosinophils Absolute: 0.3 10*3/uL (ref 0.0–0.5)
HCT: 32.9 % — ABNORMAL LOW (ref 34.8–46.6)
HEMOGLOBIN: 11.3 g/dL — AB (ref 11.6–15.9)
LYMPH%: 30.9 % (ref 14.0–49.7)
MCH: 29.6 pg (ref 25.1–34.0)
MCHC: 34.4 g/dL (ref 31.5–36.0)
MCV: 86.3 fL (ref 79.5–101.0)
MONO#: 0.3 10*3/uL (ref 0.1–0.9)
MONO%: 5.9 % (ref 0.0–14.0)
NEUT%: 57.1 % (ref 38.4–76.8)
NEUTROS ABS: 2.8 10*3/uL (ref 1.5–6.5)
Platelets: 229 10*3/uL (ref 145–400)
RBC: 3.82 10*6/uL (ref 3.70–5.45)
RDW: 13.3 % (ref 11.2–14.5)
WBC: 4.9 10*3/uL (ref 3.9–10.3)
lymph#: 1.5 10*3/uL (ref 0.9–3.3)

## 2016-04-03 LAB — COMPREHENSIVE METABOLIC PANEL
ALBUMIN: 3.4 g/dL — AB (ref 3.5–5.0)
ALK PHOS: 66 U/L (ref 40–150)
ALT: 14 U/L (ref 0–55)
AST: 11 U/L (ref 5–34)
Anion Gap: 9 mEq/L (ref 3–11)
BUN: 6 mg/dL — AB (ref 7.0–26.0)
CO2: 25 mEq/L (ref 22–29)
Calcium: 8.7 mg/dL (ref 8.4–10.4)
Chloride: 107 mEq/L (ref 98–109)
Creatinine: 0.8 mg/dL (ref 0.6–1.1)
EGFR: 87 mL/min/{1.73_m2} — AB (ref >90)
GLUCOSE: 102 mg/dL (ref 70–140)
POTASSIUM: 3.2 meq/L — AB (ref 3.5–5.1)
Sodium: 141 mEq/L (ref 136–145)
TOTAL PROTEIN: 6.6 g/dL (ref 6.4–8.3)
Total Bilirubin: 1.26 mg/dL — ABNORMAL HIGH (ref 0.20–1.20)

## 2016-04-03 MED ORDER — HEPARIN SOD (PORK) LOCK FLUSH 100 UNIT/ML IV SOLN
500.0000 [IU] | Freq: Once | INTRAVENOUS | Status: AC | PRN
  Administered 2016-04-03: 500 [IU]
  Filled 2016-04-03: qty 5

## 2016-04-03 MED ORDER — ACETAMINOPHEN 325 MG PO TABS
ORAL_TABLET | ORAL | Status: AC
  Filled 2016-04-03: qty 2

## 2016-04-03 MED ORDER — SODIUM CHLORIDE 0.9% FLUSH
10.0000 mL | INTRAVENOUS | Status: DC | PRN
  Administered 2016-04-03: 10 mL via INTRAVENOUS
  Filled 2016-04-03: qty 10

## 2016-04-03 MED ORDER — SODIUM CHLORIDE 0.9 % IV SOLN
210.0000 mg | Freq: Once | INTRAVENOUS | Status: AC
  Administered 2016-04-03: 210 mg via INTRAVENOUS
  Filled 2016-04-03: qty 7

## 2016-04-03 MED ORDER — ACETAMINOPHEN 325 MG PO TABS
650.0000 mg | ORAL_TABLET | Freq: Once | ORAL | Status: AC
  Administered 2016-04-03: 650 mg via ORAL

## 2016-04-03 MED ORDER — ZOLEDRONIC ACID 4 MG/100ML IV SOLN
4.0000 mg | Freq: Once | INTRAVENOUS | Status: AC
  Administered 2016-04-03: 4 mg via INTRAVENOUS
  Filled 2016-04-03: qty 100

## 2016-04-03 MED ORDER — LIDOCAINE-PRILOCAINE 2.5-2.5 % EX CREA: 1.0000 "application " | TOPICAL_CREAM | CUTANEOUS | 1 refills | Status: DC | PRN

## 2016-04-03 MED ORDER — LETROZOLE 2.5 MG PO TABS: 2.5000 mg | ORAL_TABLET | Freq: Every day | ORAL | 2 refills | Status: DC

## 2016-04-03 MED ORDER — SODIUM CHLORIDE 0.9% FLUSH
10.0000 mL | INTRAVENOUS | Status: DC | PRN
  Administered 2016-04-03: 10 mL
  Filled 2016-04-03: qty 10

## 2016-04-03 MED ORDER — LORATADINE 10 MG PO TABS
10.0000 mg | ORAL_TABLET | Freq: Every day | ORAL | Status: DC
  Administered 2016-04-03: 10 mg via ORAL
  Filled 2016-04-03: qty 1

## 2016-04-03 MED ORDER — SODIUM CHLORIDE 0.9 % IV SOLN
Freq: Once | INTRAVENOUS | Status: AC
  Administered 2016-04-03: 12:00:00 via INTRAVENOUS

## 2016-04-03 MED ORDER — TRASTUZUMAB CHEMO 150 MG IV SOLR
6.0000 mg/kg | Freq: Once | INTRAVENOUS | Status: AC
  Administered 2016-04-03: 357 mg via INTRAVENOUS
  Filled 2016-04-03: qty 17

## 2016-04-03 MED ORDER — POTASSIUM CHLORIDE ER 10 MEQ PO TBCR: 20.0000 meq | EXTENDED_RELEASE_TABLET | Freq: Every day | ORAL | 1 refills | Status: DC

## 2016-04-03 NOTE — Telephone Encounter (Signed)
Appointments scheduled per 3.23.18 LOS. Patient given AVS report and calendars with future scheduled appointments. °

## 2016-04-03 NOTE — Patient Instructions (Signed)
[facility] Discharge Instructions for Patients Receiving Chemotherapy  Today you received the following chemotherapy agents: Herceptin and Perjeta   To help prevent nausea and vomiting after your treatment, we encourage you to take your nausea medication as directed.    If you develop nausea and vomiting that is not controlled by your nausea medication, call the clinic.   BELOW ARE SYMPTOMS THAT SHOULD BE REPORTED IMMEDIATELY:  *FEVER GREATER THAN 100.5 F  *CHILLS WITH OR WITHOUT FEVER  NAUSEA AND VOMITING THAT IS NOT CONTROLLED WITH YOUR NAUSEA MEDICATION  *UNUSUAL SHORTNESS OF BREATH  *UNUSUAL BRUISING OR BLEEDING  TENDERNESS IN MOUTH AND THROAT WITH OR WITHOUT PRESENCE OF ULCERS  *URINARY PROBLEMS  *BOWEL PROBLEMS  UNUSUAL RASH Items with * indicate a potential emergency and should be followed up as soon as possible.  Feel free to call the clinic you have any questions or concerns. The clinic phone number is [phone].  Please show the [facility] at check-in to the Emergency Department and triage nurse.  Zoledronic Acid injection (Hypercalcemia, Oncology) What is this medicine? ZOLEDRONIC ACID (ZOE le dron ik AS id) lowers the amount of calcium loss from bone. It is used to treat too much calcium in your blood from cancer. It is also used to prevent complications of cancer that has spread to the bone. This medicine may be used for other purposes; ask your health care provider or pharmacist if you have questions. COMMON BRAND NAME(S): Zometa What should I tell my health care provider before I take this medicine? They need to know if you have any of these conditions: -aspirin-sensitive asthma -cancer, especially if you are receiving medicines used to treat cancer -dental disease or wear dentures -infection -kidney disease -receiving corticosteroids like dexamethasone or prednisone -an unusual or allergic reaction to zoledronic acid, other  medicines, foods, dyes, or preservatives -pregnant or trying to get pregnant -breast-feeding How should I use this medicine? This medicine is for infusion into a vein. It is given by a health care professional in a hospital or clinic setting. Talk to your pediatrician regarding the use of this medicine in children. Special care may be needed. Overdosage: If you think you have taken too much of this medicine contact a poison control center or emergency room at once. NOTE: This medicine is only for you. Do not share this medicine with others. What if I miss a dose? It is important not to miss your dose. Call your doctor or health care professional if you are unable to keep an appointment. What may interact with this medicine? -certain antibiotics given by injection -NSAIDs, medicines for pain and inflammation, like ibuprofen or naproxen -some diuretics like bumetanide, furosemide -teriparatide -thalidomide This list may not describe all possible interactions. Give your health care provider a list of all the medicines, herbs, non-prescription drugs, or dietary supplements you use. Also tell them if you smoke, drink alcohol, or use illegal drugs. Some items may interact with your medicine. What should I watch for while using this medicine? Visit your doctor or health care professional for regular checkups. It may be some time before you see the benefit from this medicine. Do not stop taking your medicine unless your doctor tells you to. Your doctor may order blood tests or other tests to see how you are doing. Women should inform their doctor if they wish to become pregnant or think they might be pregnant. There is a potential for serious side effects to an unborn  child. Talk to your health care professional or pharmacist for more information. You should make sure that you get enough calcium and vitamin D while you are taking this medicine. Discuss the foods you eat and the vitamins you take with  your health care professional. Some people who take this medicine have severe bone, joint, and/or muscle pain. This medicine may also increase your risk for jaw problems or a broken thigh bone. Tell your doctor right away if you have severe pain in your jaw, bones, joints, or muscles. Tell your doctor if you have any pain that does not go away or that gets worse. Tell your dentist and dental surgeon that you are taking this medicine. You should not have major dental surgery while on this medicine. See your dentist to have a dental exam and fix any dental problems before starting this medicine. Take good care of your teeth while on this medicine. Make sure you see your dentist for regular follow-up appointments. What side effects may I notice from receiving this medicine? Side effects that you should report to your doctor or health care professional as soon as possible: -allergic reactions like skin rash, itching or hives, swelling of the face, lips, or tongue -anxiety, confusion, or depression -breathing problems -changes in vision -eye pain -feeling faint or lightheaded, falls -jaw pain, especially after dental work -mouth sores -muscle cramps, stiffness, or weakness -redness, blistering, peeling or loosening of the skin, including inside the mouth -trouble passing urine or change in the amount of urine Side effects that usually do not require medical attention (report to your doctor or health care professional if they continue or are bothersome): -bone, joint, or muscle pain -constipation -diarrhea -fever -hair loss -irritation at site where injected -loss of appetite -nausea, vomiting -stomach upset -trouble sleeping -trouble swallowing -weak or tired This list may not describe all possible side effects. Call your doctor for medical advice about side effects. You may report side effects to FDA at 1-800-FDA-1088. Where should I keep my medicine? This drug is given in a hospital or  clinic and will not be stored at home. NOTE: This sheet is a summary. It may not cover all possible information. If you have questions about this medicine, talk to your doctor, pharmacist, or health care provider.  2017 Elsevier/Gold Standard (2013-05-27 14:19:39)

## 2016-04-07 ENCOUNTER — Ambulatory Visit: Payer: Medicaid Other | Admitting: Physical Therapy

## 2016-04-15 ENCOUNTER — Ambulatory Visit (HOSPITAL_COMMUNITY): Admission: RE | Admit: 2016-04-15 | Payer: Medicaid Other | Source: Ambulatory Visit

## 2016-04-20 ENCOUNTER — Other Ambulatory Visit: Payer: Self-pay | Admitting: Hematology

## 2016-04-20 DIAGNOSIS — C7951 Secondary malignant neoplasm of bone: Principal | ICD-10-CM

## 2016-04-20 DIAGNOSIS — C50911 Malignant neoplasm of unspecified site of right female breast: Secondary | ICD-10-CM

## 2016-04-22 NOTE — Progress Notes (Signed)
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)   11/1997 Cancer Diagnosis    Patient had 1.4 cm poorly differentiated right breast carcinoma diagnosed in Nov 1999 at age 51, 1/7 axillary nodes involved, ER/PR and HER 2 positive. She had mastectomy with the 7 axillary node evaluation, 4 cycles of adriamycin/ cytoxan followed by taxotere, then five years of tamoxifen thru June 2005 (no herceptin in 1999). She had bilateral oophorectomy in May 2005. She was briefly on aromatase inhibitor after tamoxifen, but discontinued this herself due to poor tolerance.      04/04/2015 Genetic Testing    Negative genetic testing on the breast/ovarian cancer pnael.  The Breast/Ovarian gene panel offered by [facility] includes sequencing and rearrangement analysis for the following 20 genes:  ATM, BARD1, BRCA1, BRCA2, BRIP1, CDH1, CHEK2, EPCAM, FANCC, MLH1, MSH2, MSH6, NBN, PALB2, PMS2, PTEN, RAD51C, RAD51D, TP53, and XRCC2.      06/26/2015 Pathology Results    Initial Path Report Bone, curettage, Left femur met, intramedullary subtroch tissue - METASTATIC ADENOCARCINOMA.  Estrogen Receptor: 95%, POSITIVE, STRONG STAINING INTENSITY Progesterone Receptor: 2%, POSITIVE, STRONG       06/26/2015 Surgery    Biopsy of metastatic tissue and stabilization with Affixus trochanteric nail, proximal and distal interlock of left femur by Dr. [name]       06/28/2015 Imaging    CT Chest w/ Contrast 1. Extensive right internal mammary chain lymphadenopathy consistent with metastatic breast cancer. 2. Lytic metastases involving the posterior elements at T1 with probable nondisplaced pathologic fracture of the spinous process. 3. No other evidence of  thoracic metastatic disease. 4. Trace bilateral pleural effusions with associated bibasilar atelectasis. 5. Indeterminate right thyroid nodule, not previously imaged. This could be evaluated with thyroid ultrasound as clinically warranted.      07/01/2015 Imaging    Bone Scan 1. Increased activity noted throughout the left femur. Although metastatic disease cannot be excluded, these changes are most likely secondary to prior surgery. 2. No other focal abnormalities identified to suggest metastatic disease.      07/12/2015 - 07/26/2015 Radiation Therapy    Left femur, 30 Gy in 10 fractions by Dr. Sondra Come       07/14/2015 Initial Diagnosis    [name], right ([place])     07/19/2015 Imaging    Initial PET Scan 1. Severe multifocal osseous metastatic disease, much greater than anticipated based on prior imaging. 2. Multifocal nodal metastases involving the right internal mammary, prevascular and subpectoral lymph nodes. No axillary pulmonary involvement identified. 3. No distant extra osseous metastases.      07/30/2015 - 11/29/2015 Chemotherapy    Docetaxel, Herceptin and Perjeta every 3 weeks X6 cycles, pt tolerated moderately well, restaging scan showed excellent response       09/18/2015 Imaging    CT Head w/wo Contrast 1. No acute intracranial abnormality or significant interval change. 2. Stable minimal periventricular white matter hypoattenuation on the right. This may reflect a remote ischemic injury. 3. No evidence for metastatic disease to the brain. 4. No focal soft tissue lesion to explain the patient's tenderness.      10/30/2015 Imaging    CT Abdomen Pelvis w/ Contrast 1. Few mildly prominent  fluid-filled loops of small bowel scattered throughout the abdomen, with intraluminal fluid density within the distal colon. Given the provided history, findings are suggestive of acute enteritis/diarrheal illness. 2. No other acute intra-abdominal or pelvic  process identified. 3. Widespread osseous metastatic disease, better evaluated on most recent PET-CT from 07/19/2015. No pathologic fracture or other complication. 4. 11 mm hypodensity within the left kidney. This lesion measures intermediate density, and is indeterminate. While this lesion is similar in size relative to recent studies, this is increased in size relative to prior study from 2012. Further evaluation with dedicated renal mass protocol CT and/or MRI is recommended for complete characterization.      12/25/2015 Imaging    Restaging PET Scan 1. Markedly improved skeletal activity, with only several faint foci of residual accentuated metabolic activity, but resolution of the vast majority of the previously extensive osseous metastatic disease. 2. Resolution of the prior right internal mammary and right prevascular lymph nodes. 3. Residual subcutaneous edema and edema along fascia planes in the left upper thigh. This remains somewhat more than I would expect for placement of an IM nail 6 weeks ago. If there is leg swelling further down, consider Doppler venous ultrasound to rule out left lower extremity DVT. I do not perceive an obvious difference in density between the pelvic and common femoral veins on the noncontrast CT data. 4. Chronic right maxillary and right sphenoid sinusitis.      12/2015 -  Chemotherapy    Maintenance Herceptin and pejeta every 3 weeks      01/16/2016 -  Anti-estrogen oral therapy    Letrozole 2.5 mg daily       HISTORY OF PRESENTING ILLNESS (From Dr. Mariana Kaufman note on 02/06/2016):  [name] 51 y.o. 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 She has had an excellent response to 6 cycles of taxotere herceptin perjeta from 07-30-15 thru 11-29-15. She is continuing herceptin and perjeta every 3 weeks. She had extreme local pain with first xgeva injection  on 01-16-16 and prefers resuming previous zometa. She began letrozole ~ 01-16-16, has no complaints about this at all.  Last imaging was PET 12-25-15.  NOTE CTs, plain Xrays and bone scan did NOT show any of the extensively metastatic bone involvement 06-2015; MRI and PET did image disease clearly.  Patient continues to do very well overall. MRSA skin lesions on scalp and face improved quickly with doxycycline starting 01-16-16. She continues to use Dial soap and we have again strongly encouraged good hygiene with other family members at home. She has had the 3 lymphedema PT sessions allowed by insurance, continues to do massage for LLE herself, still has not gotten the ordered compression apparatus for home and will follow up with PT on that. Swelling in LLE has improved with the interventions and does not seem as uncomfortable. She denies any pain. Appetite is excellent, bowels fine, no SOB, good energy, no problems with PAC, no noted changes in left breast. No bleeding. No fever or symptoms of infection. She slipped in tub last week, fell onto right arm fortunately without injury. She was upset by a visitor and shaky then, but no tremors otherwise.   She has two children and 4 grandchildren. She has had genetic testing and it was negative. She lives at home with her husband. She is very active. Her children live close to her.   CURRENT THERAPY:  Letrozole 2.40m daily,  Herceptin and Perjeta every 3 weeks   INTERIM HISTORY: He returns today for follow up. She has been previously seen by Dr. Marko Plume, who has retired lately. Pt is deaf and speaks through a sign language interpreter. Her husband was also present and he is also deaf.  She reports breakouts on her face and pain in the back of her head. They started last week. Denies fever. She uses neosporin on the breakouts, but it did not work. Insurance will not cover her PET scan. The patient forgot about her Echo appointment. The patient states her port  site does not bother her anymore.  MEDICAL HISTORY:  Past Medical History:  Diagnosis Date  . Abnormal Pap smear   . Anxiety   . Breast cancer ([place])   . Cancer ([place]) 1999   breast-s/p mastectomy, chemo, rad  . CIN I (cervical intraepithelial neoplasia I) 2003   by colpo  . Congenital deafness   . Deaf   . Depression   . Radiation 07/12/15-07/26/15   left femur 30 Gy  . S/P bilateral oophorectomy     SURGICAL HISTORY: Past Surgical History:  Procedure Laterality Date  . ABDOMINAL HYSTERECTOMY    . INTRAMEDULLARY (IM) NAIL INTERTROCHANTERIC Left 06/26/2015   Procedure: LEFT BIOMET LONG AFFIXS NAIL;  Surgeon: [name], MD;  Location: [facility];  Service: Orthopedics;  Laterality: Left;  . IR GENERIC HISTORICAL  08/06/2015   IR US GUIDE VASC ACCESS LEFT 08/06/2015 Aletta Edouard, MD [facility]  . IR GENERIC HISTORICAL  08/06/2015   IR FLUORO GUIDE CV LINE LEFT 08/06/2015 Aletta Edouard, MD [facility]  . IR GENERIC HISTORICAL  03/05/2016   IR CV LINE INJECTION 03/05/2016 Markus Daft, MD [facility]  . MASTECTOMY     right breast  . MASTECTOMY    . OVARIAN CYST REMOVAL    . RADIOLOGY WITH ANESTHESIA Left 06/25/2015   Procedure: MRI OF LEFT HIP WITH OR WITHOUT CONTRAST;  Surgeon: Medication Radiologist, MD;  Location: [facility];  Service: Radiology;  Laterality: Left;  DR. MCINTYRE/MRI  . TUBAL LIGATION      SOCIAL HISTORY: Social History   Social History  . Marital status: Married    Spouse name: N/A  . Number of children: 2  . Years of education: N/A   Occupational History  . Not on file.   Social History Main Topics  . Smoking status: Never Smoker  . Smokeless tobacco: Never Used  . Alcohol use No  . Drug use: No  . Sexual activity: Yes    Birth control/ protection: Surgical   Other Topics Concern  . Not on file   Social History Narrative  . No narrative on file    FAMILY HISTORY: Family History  Problem Relation Age of Onset  . Hypertension Mother   .  Seizures Mother   . Alzheimer's disease Maternal Uncle   . Pancreatitis Maternal Grandmother   . Heart attack Maternal Grandfather     ALLERGIES:  is allergic to chlorhexidine; promethazine hcl; and diphenhydramine hcl.  MEDICATIONS:  Current Outpatient Prescriptions  Medication Sig Dispense Refill  . Cholecalciferol 2000 units CAPS Take 1 capsule by mouth daily.    . divalproex (DEPAKOTE) 250 MG DR tablet Take 1 tablet (250 mg total) by mouth at bedtime. 30 tablet 2  . doxycycline (VIBRA-TABS) 100 MG tablet Take 1 tablet (100 mg total) by mouth 2 (two) times daily. 14 tablet 7  . letrozole (FEMARA) 2.5 MG tablet Take 1 tablet (2.5  mg total) by mouth daily. 30 tablet 2  . lidocaine-prilocaine (EMLA) cream Apply 1 application topically as needed. Apply to Porta-Cath 1-2 hours prior to access as directed. 30 g 1  . loperamide (IMODIUM A-D) 2 MG tablet Take 1-2 tablets after each loose stool. UP TO 6 IN  24 HRS. (Patient not taking: Reported on 01/16/2016) 30 tablet 1  . LORazepam (ATIVAN) 1 MG tablet Place one tablet under the tongue or swallow every 4-6 hours as needed for nausea. (THIS IS THE SAME STRENGTH AS 2 OF YOUR 0.5 MG TABLETS) (Patient not taking: Reported on 01/16/2016) 30 tablet 0  . ondansetron (ZOFRAN) 8 MG tablet Take 1 tablet (8 mg total) by mouth every 8 (eight) hours as needed for nausea or vomiting. (Patient not taking: Reported on 01/16/2016) 30 tablet 0  . potassium chloride (K-DUR) 10 MEQ tablet Take 2 tablets (20 mEq total) by mouth daily. 60 tablet 1   No current facility-administered medications for this visit.    Facility-Administered Medications Ordered in Other Visits  Medication Dose Route Frequency Provider Last Rate Last Dose  . sodium chloride 0.9 % 1,000 mL with potassium chloride 10 mEq infusion   Intravenous Continuous Gordy Levan, MD   Stopped at 09/10/15 1653  . sodium chloride 0.9 % injection 10 mL  10 mL Intravenous PRN Lennis Marion Downer, MD         REVIEW OF SYSTEMS:   Constitutional: Denies fevers, chills or abnormal night sweats (+) Hot flashes (+) Pain in the back of her head Eyes: Denies blurriness of vision, double vision or watery eyes Ears, nose, mouth, throat, and face: Denies mucositis or sore throat (+) deaf Respiratory: Denies cough, dyspnea or wheezes Cardiovascular: Denies palpitation, chest discomfort or lower extremity swelling Gastrointestinal:  Denies nausea, heartburn or change in bowel habits Skin: (+) several pacular skin rash around her upper lip, (+) Boil on the back of her head Lymphatics: Denies new lymphadenopathy or easy bruising Neurological: (+) Left foot numbness. Behavioral/Psych: Mood is stable, no new changes  All other systems were reviewed with the patient and are negative.  PHYSICAL EXAMINATION: ECOG PERFORMANCE STATUS: 0 - Asymptomatic  Vitals:   04/24/16 1142  BP: 129/67  Pulse: 65  Resp: 18  Temp: 97.8 F (36.6 C)   Filed Weights   04/24/16 1142  Weight: 132 lb 1.6 oz (59.9 kg)   GENERAL:alert, no distress and comfortable SKIN: (+) Boil on the back of her scalp about 1.5 cm, no surrounding scalp erythema. Several small papular rash above her upper lip. EYES: normal, conjunctiva are pink and non-injected, sclera clear OROPHARYNX:no exudate, no erythema and lips, buccal mucosa, and tongue normal  NECK: supple, thyroid normal size, non-tender, without nodularity LYMPH:  no palpable lymphadenopathy in the cervical, axillary or inguinal LUNGS: clear to auscultation and percussion with normal breathing effort HEART: regular rate & rhythm and no murmurs and no lower extremity edema ABDOMEN:abdomen soft, non-tender and normal bowel sounds Musculoskeletal:no cyanosis of digits and no clubbing  PSYCH: alert & oriented x 3 with fluent speech NEURO: no focal motor/sensory deficits Breasts: Breast inspection showed Right breast is surgically absent status post reconstruction. Palpation of  the breasts and axilla revealed no obvious mass that I could appreciate.   LABORATORY DATA:  I have reviewed the data as listed CBC Latest Ref Rng & Units 04/24/2016 04/03/2016 03/05/2016  WBC 3.9 - 10.3 10e3/uL 5.3 4.9 4.3  Hemoglobin 11.6 - 15.9 g/dL 11.3(L) 11.3(L) 10.8(L)  Hematocrit  34.8 - 46.6 % 33.3(L) 32.9(L) 31.5(L)  Platelets 145 - 400 10e3/uL 216 229 178   CMP Latest Ref Rng & Units 04/24/2016 04/03/2016 03/05/2016  Glucose 70 - 140 mg/dl 94 102 108  BUN 7.0 - 26.0 mg/dL 7.8 6.0(L) 9.4  Creatinine 0.6 - 1.1 mg/dL 0.8 0.8 0.7  Sodium 136 - 145 mEq/L 140 141 141  Potassium 3.5 - 5.1 mEq/L 3.6 3.2(L) 3.1(L)  Chloride 101 - 111 mmol/L - - -  CO2 22 - 29 mEq/L _0 Calcium 8.4 - 10.4 mg/dL 9.1 8.7 8.8  Total Protein 6.4 - 8.3 g/dL 6.7 6.6 6.3(L)  Total Bilirubin 0.20 - 1.20 mg/dL 1.25(H) 1.26(H) 1.03  Alkaline Phos 40 - 150 U/L 59 66 57  AST 5 - 34 U/L _1 ALT 0 - 55 U/L _2 RADIOGRAPHIC STUDIES: I have personally reviewed the radiological images as listed and agreed with the findings in the report. No results found. ASSESSMENT & PLAN:  Mrs. Mcelwain is a 51 y.o. postmenopause female with:  1. Metastatic ER+ HER 2+ right breast cancer involving bone -I previously reviewed her medical records extensively, and confirmed the points with patient, sees the oncology history summary above. -Restaging PET 12-25-15 markedly improved of bone metastasis after initial systemic chemo  -Letrozole begun 01-16-16, continue herceptin and perjeta.  -Did not tolerate SQ xgeva and it was changed to zometa monthly, will change it to evey 6 weeks to match her infusion  -Echocardiogram 01-27-16 showed normal EF, will continue monitoring every 3 months when she is on herceptin -She previously had burning sensation at port with flush and iv saline. I previously reviewed the dye study for her port. No leaks were present.  -Echo due in April 2018. She doesn't have a cardiologist. -She is  clinically doing well, laboratory, her CBC and CMP are unremarkable except mild elevated bilirubin, her physical exam was unremarkable. we'll continue current treatment. -I recommended a restaging PET scan, but insurance denied it. I have ordered a CT CAP in the next week and bone scan will be scheduled. -continue letrezole   2. History of MRSA skin infection scalp, boil at low back scalp and upper lip -Uses warm compress  -I prescribed doxycycline on 04/24/16 for 7 days   3.congenital deafness -needs sign language interpreter for all visits.  -Husband also deaf.  4. premature surgical menopause -post BSO 2048fr pelvic pain and ovarian cysts, likely was also beneficial from standpoint of the breast cancer. -hysterectomy.   5. Intermittent anemia  -mild, possible related to prior chemo -She is asymptomatic, we'll continue close monitoring  6. Hypokalemia  -K 3.1 on 03/05/16, she is on KCL KCL 245m daily, increased to 402mdaily for a week. -K 3.2 on 04/03/16. The patient is on KCl 9m4maily, increased to 40me66mily for a week and then take 1 pill a day as normal. -resolved now, continue KCL   7. Goal of care discussion  -We previously discussed the incurable nature of her cancer, and the overall poor prognosis, especially if she does not have good response to chemotherapy or progress on chemo. We also discussed that she has had excellent response to chemo, and her disease may be controlled for long time. -The patient understands the goal of care is palliative. -she is full code for now   Plan: -Herceptin+Perjeta infusion today. -Lab, flush, f/u and herceptin+perjeta in 3 weeks. -Echo in 1-2 weeks. -CT CAP on 04/30/16. -Bone  scan to be scheduled. -Port flush on 04/30/16. -I prescribed doxycycline today.  All questions were answered. The patient knows to call the clinic with any problems, questions or concerns.  I spent 25 minutes counseling the patient face to face. The  total time spent in the appointment was 30 minutes and more than 50% was on counseling.   Truitt Merle, MD 04/24/2016   This document serves as a record of services personally performed by Truitt Merle, MD. It was created on her behalf by Darcus Austin, a trained medical scribe. The creation of this record is based on the scribe's personal observations and the provider's statements to them. This document has been checked and approved by the attending provider.

## 2016-04-23 ENCOUNTER — Ambulatory Visit: Payer: No Typology Code available for payment source | Admitting: Oncology

## 2016-04-23 ENCOUNTER — Other Ambulatory Visit: Payer: No Typology Code available for payment source

## 2016-04-24 ENCOUNTER — Other Ambulatory Visit (HOSPITAL_BASED_OUTPATIENT_CLINIC_OR_DEPARTMENT_OTHER): Payer: Medicaid Other

## 2016-04-24 ENCOUNTER — Ambulatory Visit (HOSPITAL_BASED_OUTPATIENT_CLINIC_OR_DEPARTMENT_OTHER): Payer: Medicaid Other

## 2016-04-24 ENCOUNTER — Encounter: Payer: Self-pay | Admitting: Hematology

## 2016-04-24 ENCOUNTER — Ambulatory Visit (HOSPITAL_BASED_OUTPATIENT_CLINIC_OR_DEPARTMENT_OTHER): Payer: Medicaid Other | Admitting: Hematology

## 2016-04-24 ENCOUNTER — Ambulatory Visit: Payer: Medicaid Other

## 2016-04-24 VITALS — BP 129/67 | HR 65 | Temp 97.8°F | Resp 18 | Ht 67.0 in | Wt 132.1 lb

## 2016-04-24 DIAGNOSIS — C50911 Malignant neoplasm of unspecified site of right female breast: Secondary | ICD-10-CM

## 2016-04-24 DIAGNOSIS — D649 Anemia, unspecified: Secondary | ICD-10-CM | POA: Diagnosis not present

## 2016-04-24 DIAGNOSIS — C799 Secondary malignant neoplasm of unspecified site: Principal | ICD-10-CM

## 2016-04-24 DIAGNOSIS — L0202 Furuncle of face: Secondary | ICD-10-CM | POA: Diagnosis not present

## 2016-04-24 DIAGNOSIS — Z95828 Presence of other vascular implants and grafts: Secondary | ICD-10-CM

## 2016-04-24 DIAGNOSIS — C50919 Malignant neoplasm of unspecified site of unspecified female breast: Secondary | ICD-10-CM

## 2016-04-24 DIAGNOSIS — H905 Unspecified sensorineural hearing loss: Secondary | ICD-10-CM

## 2016-04-24 DIAGNOSIS — C7951 Secondary malignant neoplasm of bone: Secondary | ICD-10-CM

## 2016-04-24 DIAGNOSIS — E28319 Asymptomatic premature menopause: Secondary | ICD-10-CM

## 2016-04-24 DIAGNOSIS — H9193 Unspecified hearing loss, bilateral: Secondary | ICD-10-CM

## 2016-04-24 DIAGNOSIS — E876 Hypokalemia: Secondary | ICD-10-CM | POA: Diagnosis not present

## 2016-04-24 DIAGNOSIS — L02821 Furuncle of head [any part, except face]: Secondary | ICD-10-CM | POA: Diagnosis not present

## 2016-04-24 DIAGNOSIS — Z5112 Encounter for antineoplastic immunotherapy: Secondary | ICD-10-CM | POA: Diagnosis present

## 2016-04-24 LAB — CBC WITH DIFFERENTIAL/PLATELET
BASO%: 0.2 % (ref 0.0–2.0)
Basophils Absolute: 0 10*3/uL (ref 0.0–0.1)
EOS%: 3 % (ref 0.0–7.0)
Eosinophils Absolute: 0.2 10*3/uL (ref 0.0–0.5)
HEMATOCRIT: 33.3 % — AB (ref 34.8–46.6)
HEMOGLOBIN: 11.3 g/dL — AB (ref 11.6–15.9)
LYMPH%: 27.1 % (ref 14.0–49.7)
MCH: 29.7 pg (ref 25.1–34.0)
MCHC: 33.9 g/dL (ref 31.5–36.0)
MCV: 87.4 fL (ref 79.5–101.0)
MONO#: 0.3 10*3/uL (ref 0.1–0.9)
MONO%: 6.4 % (ref 0.0–14.0)
NEUT%: 63.3 % (ref 38.4–76.8)
NEUTROS ABS: 3.3 10*3/uL (ref 1.5–6.5)
PLATELETS: 216 10*3/uL (ref 145–400)
RBC: 3.81 10*6/uL (ref 3.70–5.45)
RDW: 13.2 % (ref 11.2–14.5)
WBC: 5.3 10*3/uL (ref 3.9–10.3)
lymph#: 1.4 10*3/uL (ref 0.9–3.3)

## 2016-04-24 LAB — COMPREHENSIVE METABOLIC PANEL
ALBUMIN: 3.6 g/dL (ref 3.5–5.0)
ALT: 9 U/L (ref 0–55)
ANION GAP: 7 meq/L (ref 3–11)
AST: 11 U/L (ref 5–34)
Alkaline Phosphatase: 59 U/L (ref 40–150)
BILIRUBIN TOTAL: 1.25 mg/dL — AB (ref 0.20–1.20)
BUN: 7.8 mg/dL (ref 7.0–26.0)
CO2: 28 meq/L (ref 22–29)
CREATININE: 0.8 mg/dL (ref 0.6–1.1)
Calcium: 9.1 mg/dL (ref 8.4–10.4)
Chloride: 106 mEq/L (ref 98–109)
EGFR: >90 mL/min/{1.73_m2} (ref >90)
Glucose: 94 mg/dl (ref 70–140)
Potassium: 3.6 mEq/L (ref 3.5–5.1)
Sodium: 140 mEq/L (ref 136–145)
TOTAL PROTEIN: 6.7 g/dL (ref 6.4–8.3)

## 2016-04-24 MED ORDER — ACETAMINOPHEN 325 MG PO TABS
ORAL_TABLET | ORAL | Status: AC
  Filled 2016-04-24: qty 2

## 2016-04-24 MED ORDER — ACETAMINOPHEN 325 MG PO TABS
650.0000 mg | ORAL_TABLET | Freq: Once | ORAL | Status: AC
  Administered 2016-04-24: 650 mg via ORAL

## 2016-04-24 MED ORDER — DOXYCYCLINE HYCLATE 100 MG PO TABS: 100.0000 mg | ORAL_TABLET | Freq: Two times a day (BID) | ORAL | 7 refills | Status: DC

## 2016-04-24 MED ORDER — ZOLEDRONIC ACID 4 MG/5ML IV CONC: 4.0000 mg | Freq: Once | INTRAVENOUS | Status: DC

## 2016-04-24 MED ORDER — TRASTUZUMAB CHEMO 150 MG IV SOLR
6.0000 mg/kg | Freq: Once | INTRAVENOUS | Status: AC
  Administered 2016-04-24: 357 mg via INTRAVENOUS
  Filled 2016-04-24: qty 17

## 2016-04-24 MED ORDER — SODIUM CHLORIDE 0.9 % IV SOLN
210.0000 mg | Freq: Once | INTRAVENOUS | Status: AC
  Administered 2016-04-24: 210 mg via INTRAVENOUS
  Filled 2016-04-24: qty 7

## 2016-04-24 MED ORDER — LORATADINE 10 MG PO TABS
10.0000 mg | ORAL_TABLET | Freq: Every day | ORAL | Status: DC
  Administered 2016-04-24: 10 mg via ORAL
  Filled 2016-04-24: qty 1

## 2016-04-24 MED ORDER — SODIUM CHLORIDE 0.9 % IV SOLN
Freq: Once | INTRAVENOUS | Status: AC
  Administered 2016-04-24: 13:00:00 via INTRAVENOUS

## 2016-04-24 MED ORDER — SODIUM CHLORIDE 0.9% FLUSH
10.0000 mL | INTRAVENOUS | Status: DC | PRN
  Administered 2016-04-24: 10 mL
  Filled 2016-04-24: qty 10

## 2016-04-24 MED ORDER — HEPARIN SOD (PORK) LOCK FLUSH 100 UNIT/ML IV SOLN
500.0000 [IU] | Freq: Once | INTRAVENOUS | Status: AC | PRN
  Administered 2016-04-24: 500 [IU]
  Filled 2016-04-24: qty 5

## 2016-04-24 NOTE — Progress Notes (Signed)
Declined to stay for 30-minute post-observation after Perjeta

## 2016-04-24 NOTE — Patient Instructions (Signed)
[facility] Discharge Instructions for Patients Receiving Chemotherapy  Today you received the following chemotherapy agents Herceptin and Perjeta   To help prevent nausea and vomiting after your treatment, we encourage you to take your nausea medication as directed.    If you develop nausea and vomiting that is not controlled by your nausea medication, call the clinic.   BELOW ARE SYMPTOMS THAT SHOULD BE REPORTED IMMEDIATELY:  *FEVER GREATER THAN 100.5 F  *CHILLS WITH OR WITHOUT FEVER  NAUSEA AND VOMITING THAT IS NOT CONTROLLED WITH YOUR NAUSEA MEDICATION  *UNUSUAL SHORTNESS OF BREATH  *UNUSUAL BRUISING OR BLEEDING  TENDERNESS IN MOUTH AND THROAT WITH OR WITHOUT PRESENCE OF ULCERS  *URINARY PROBLEMS  *BOWEL PROBLEMS  UNUSUAL RASH Items with * indicate a potential emergency and should be followed up as soon as possible.  Feel free to call the clinic you have any questions or concerns. The clinic phone number is [phone].  Please show the CHEMO ALERT CARD at check-in to the Emergency Department and triage nurse.   

## 2016-04-27 ENCOUNTER — Telehealth: Payer: Self-pay | Admitting: Hematology

## 2016-04-27 LAB — CANCER ANTIGEN 27.29: CAN 27.29: 55.2 U/mL — AB (ref 0.0–38.6)

## 2016-04-27 NOTE — Telephone Encounter (Signed)
Scheduled appts per 4/13 los. Left message with interpreter with appt date and time.

## 2016-04-30 ENCOUNTER — Ambulatory Visit (HOSPITAL_COMMUNITY): Payer: Medicaid Other

## 2016-05-01 ENCOUNTER — Ambulatory Visit (HOSPITAL_COMMUNITY)
Admission: RE | Admit: 2016-05-01 | Discharge: 2016-05-01 | Disposition: A | Discharge status: Home / Self Care | Payer: Medicaid Other | Source: Ambulatory Visit | Attending: Hematology | Admitting: Hematology

## 2016-05-01 ENCOUNTER — Ambulatory Visit (HOSPITAL_BASED_OUTPATIENT_CLINIC_OR_DEPARTMENT_OTHER): Payer: Medicaid Other

## 2016-05-01 ENCOUNTER — Encounter (HOSPITAL_COMMUNITY)
Admission: RE | Admit: 2016-05-01 | Discharge: 2016-05-01 | Disposition: A | Discharge status: Home / Self Care | Payer: Medicaid Other | Source: Ambulatory Visit | Attending: Hematology | Admitting: Hematology

## 2016-05-01 DIAGNOSIS — C50911 Malignant neoplasm of unspecified site of right female breast: Secondary | ICD-10-CM | POA: Diagnosis not present

## 2016-05-01 DIAGNOSIS — C7951 Secondary malignant neoplasm of bone: Secondary | ICD-10-CM

## 2016-05-01 DIAGNOSIS — Z95828 Presence of other vascular implants and grafts: Secondary | ICD-10-CM

## 2016-05-01 DIAGNOSIS — Z452 Encounter for adjustment and management of vascular access device: Secondary | ICD-10-CM

## 2016-05-01 MED ORDER — HEPARIN SOD (PORK) LOCK FLUSH 100 UNIT/ML IV SOLN
INTRAVENOUS | Status: AC
  Filled 2016-05-01: qty 5

## 2016-05-01 MED ORDER — IOPAMIDOL (ISOVUE-300) INJECTION 61%
INTRAVENOUS | Status: AC
  Filled 2016-05-01: qty 100

## 2016-05-01 MED ORDER — SODIUM CHLORIDE 0.9% FLUSH
10.0000 mL | INTRAVENOUS | Status: AC | PRN
  Administered 2016-05-01: 10 mL
  Filled 2016-05-01: qty 10

## 2016-05-01 MED ORDER — HEPARIN SOD (PORK) LOCK FLUSH 100 UNIT/ML IV SOLN
500.0000 [IU] | INTRAVENOUS | Status: DC | PRN
  Filled 2016-05-01: qty 5

## 2016-05-01 MED ORDER — TECHNETIUM TC 99M MEDRONATE IV KIT
20.1000 | PACK | Freq: Once | INTRAVENOUS | Status: AC | PRN
  Administered 2016-05-01: 20.1 via INTRAVENOUS

## 2016-05-01 MED ORDER — IOPAMIDOL (ISOVUE-300) INJECTION 61%
100.0000 mL | Freq: Once | INTRAVENOUS | Status: AC | PRN
  Administered 2016-05-01: 100 mL via INTRAVENOUS

## 2016-05-01 NOTE — Progress Notes (Signed)
Pt not sent with sorbaview for CT scan because pt has sensitive skin and uses the opsite dressing. Explained that if there were any issues with radiology removing her port needle to come back to the infusion room for deaccess before going home. Pt verbalized understanding.

## 2016-05-01 NOTE — Patient Instructions (Signed)
Implanted Port Home Guide An implanted port is a type of central line that is placed under the skin. Central lines are used to provide IV access when treatment or nutrition needs to be given through a person's veins. Implanted ports are used for long-term IV access. An implanted port may be placed because:  You need IV medicine that would be irritating to the small veins in your hands or arms.  You need long-term IV medicines, such as antibiotics.  You need IV nutrition for a long period.  You need frequent blood draws for lab tests.  You need dialysis.  Implanted ports are usually placed in the chest area, but they can also be placed in the upper arm, the abdomen, or the leg. An implanted port has two main parts:  Reservoir. The reservoir is round and will appear as a small, raised area under your skin. The reservoir is the part where a needle is inserted to give medicines or draw blood.  Catheter. The catheter is a thin, flexible tube that extends from the reservoir. The catheter is placed into a large vein. Medicine that is inserted into the reservoir goes into the catheter and then into the vein.  How will I care for my incision site? Do not get the incision site wet. Bathe or shower as directed by your health care provider. How is my port accessed? Special steps must be taken to access the port:  Before the port is accessed, a numbing cream can be placed on the skin. This helps numb the skin over the port site.  Your health care provider uses a sterile technique to access the port. ? Your health care provider must put on a mask and sterile gloves. ? The skin over your port is cleaned carefully with an antiseptic and allowed to dry. ? The port is gently pinched between sterile gloves, and a needle is inserted into the port.  Only "non-coring" port needles should be used to access the port. Once the port is accessed, a blood return should be checked. This helps ensure that the port  is in the vein and is not clogged.  If your port needs to remain accessed for a constant infusion, a clear (transparent) bandage will be placed over the needle site. The bandage and needle will need to be changed every week, or as directed by your health care provider.  Keep the bandage covering the needle clean and dry. Do not get it wet. Follow your health care provider's instructions on how to take a shower or bath while the port is accessed.  If your port does not need to stay accessed, no bandage is needed over the port.  What is flushing? Flushing helps keep the port from getting clogged. Follow your health care provider's instructions on how and when to flush the port. Ports are usually flushed with saline solution or a medicine called heparin. The need for flushing will depend on how the port is used.  If the port is used for intermittent medicines or blood draws, the port will need to be flushed: ? After medicines have been given. ? After blood has been drawn. ? As part of routine maintenance.  If a constant infusion is running, the port may not need to be flushed.  How long will my port stay implanted? The port can stay in for as long as your health care provider thinks it is needed. When it is time for the port to come out, surgery will be   done to remove it. The procedure is similar to the one performed when the port was put in. When should I seek immediate medical care? When you have an implanted port, you should seek immediate medical care if:  You notice a bad smell coming from the incision site.  You have swelling, redness, or drainage at the incision site.  You have more swelling or pain at the port site or the surrounding area.  You have a fever that is not controlled with medicine.  This information is not intended to replace advice given to you by your health care provider. Make sure you discuss any questions you have with your health care provider. Document  Released: 12/29/2004 Document Revised: 06/06/2015 Document Reviewed: 09/05/2012 Elsevier Interactive Patient Education  2017 [employer]  

## 2016-05-05 ENCOUNTER — Telehealth: Payer: Self-pay | Admitting: *Deleted

## 2016-05-05 NOTE — Telephone Encounter (Signed)
Message left with pt earlier to call back through deaf interpreter.  Pt returned call & informed of stable CT/Bone scan.  Pt asked if this is good or bad.  Informed stable is good for [place] & at least nothing is growing.  Informed that Dr Burr Medico will discuss at next app & informed of date & time for 05/14/16.

## 2016-05-05 NOTE — Telephone Encounter (Signed)
-----   Message from Truitt Merle, MD sent at 05/03/2016  8:53 PM EDT ----- Please let pt know that her CT and bone scan showed stable disease. I will discuss with her more in detail when I see her on next visit on 5/3.   Thanks.  Truitt Merle  05/03/2016

## 2016-05-13 NOTE — Progress Notes (Signed)
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 She had mastectomy with the 7 axillary node evaluation, 4 cycles of adriamycin/ cytoxan followed by taxotere, then five years of tamoxifen thru June 2005 (no herceptin in 1999). She had bilateral oophorectomy in May 2005. She was briefly on aromatase inhibitor after tamoxifen, but discontinued this herself due to poor tolerance.      04/04/2015 Genetic Testing    Negative genetic testing on the breast/ovarian cancer pnael.  The Breast/Ovarian gene panel offered by [facility] includes sequencing and rearrangement analysis for the following 20 genes:  ATM, BARD1, BRCA1, BRCA2, BRIP1, CDH1, CHEK2, EPCAM, FANCC, MLH1, MSH2, MSH6, NBN, PALB2, PMS2, PTEN, RAD51C, RAD51D, TP53, and XRCC2.      06/26/2015 Pathology Results    Initial Path Report Bone, curettage, Left femur met, intramedullary subtroch tissue - METASTATIC ADENOCARCINOMA.  Estrogen Receptor: 95%, POSITIVE, STRONG STAINING INTENSITY Progesterone Receptor: 2%, POSITIVE, STRONG       06/26/2015 Surgery    Biopsy of metastatic tissue and stabilization with Affixus trochanteric nail, proximal and distal interlock of left femur by Dr. [name]       06/28/2015 Imaging    CT Chest w/ Contrast 1. Extensive right internal mammary chain lymphadenopathy consistent with metastatic breast cancer. 2. Lytic metastases involving the posterior elements at T1 with probable nondisplaced pathologic fracture of the spinous process. 3. No other evidence of  thoracic metastatic disease. 4. Trace bilateral pleural effusions with associated bibasilar atelectasis. 5. Indeterminate right thyroid nodule, not previously imaged. This could be evaluated with thyroid ultrasound as clinically warranted.      07/01/2015 Imaging    Bone Scan 1. Increased activity noted throughout the left femur. Although metastatic disease cannot be excluded, these changes are most likely secondary to prior surgery. 2. No other focal abnormalities identified to suggest metastatic disease.      07/12/2015 - 07/26/2015 Radiation Therapy    Left femur, 30 Gy in 10 fractions by Dr. Sondra Come       07/14/2015 Initial Diagnosis    [name], right ([place])     07/19/2015 Imaging    Initial PET Scan 1. Severe multifocal osseous metastatic disease, much greater than anticipated based on prior imaging. 2. Multifocal nodal metastases involving the right internal mammary, prevascular and subpectoral lymph nodes. No axillary pulmonary involvement identified. 3. No distant extra osseous metastases.      07/30/2015 - 11/29/2015 Chemotherapy    Docetaxel, Herceptin and Perjeta every 3 weeks X6 cycles, pt tolerated moderately well, restaging scan showed excellent response       09/18/2015 Imaging    CT Head w/wo Contrast 1. No acute intracranial abnormality or significant interval change. 2. Stable minimal periventricular white matter hypoattenuation on the right. This may reflect a remote ischemic injury. 3. No evidence for metastatic disease to the brain. 4. No focal soft tissue lesion to explain the patient's tenderness.      10/30/2015 Imaging    CT Abdomen Pelvis w/ Contrast 1. Few mildly prominent  fluid-filled loops of small bowel scattered throughout the abdomen, with intraluminal fluid density within the distal colon. Given the provided history, findings are suggestive of acute enteritis/diarrheal illness. 2. No other acute intra-abdominal or pelvic  process identified. 3. Widespread osseous metastatic disease, better evaluated on most recent PET-CT from 07/19/2015. No pathologic fracture or other complication. 4. 11 mm hypodensity within the left kidney. This lesion measures intermediate density, and is indeterminate. While this lesion is similar in size relative to recent studies, this is increased in size relative to prior study from 2012. Further evaluation with dedicated renal mass protocol CT and/or MRI is recommended for complete characterization.      12/25/2015 Imaging    Restaging PET Scan 1. Markedly improved skeletal activity, with only several faint foci of residual accentuated metabolic activity, but resolution of the vast majority of the previously extensive osseous metastatic disease. 2. Resolution of the prior right internal mammary and right prevascular lymph nodes. 3. Residual subcutaneous edema and edema along fascia planes in the left upper thigh. This remains somewhat more than I would expect for placement of an IM nail 6 weeks ago. If there is leg swelling further down, consider Doppler venous ultrasound to rule out left lower extremity DVT. I do not perceive an obvious difference in density between the pelvic and common femoral veins on the noncontrast CT data. 4. Chronic right maxillary and right sphenoid sinusitis.      12/2015 -  Chemotherapy    Maintenance Herceptin and pejeta every 3 weeks      01/16/2016 -  Anti-estrogen oral therapy    Letrozole 2.5 mg daily      03/01/2016 Miscellaneous    Patient presented to ED following a fall; she reports she landed on her back and is now experiencing back pain. The patient was evaluated and discharge home same day with pain control.      05/01/2016 Imaging    CT CAP IMPRESSION: 1. Stable exam.  No new or progressive disease identified. 2. No evidence for residual or recurrent adenopathy within the chest. 3. Stable bone metastasis.      05/01/2016 Imaging    BONE  SCAN IMPRESSION: 1. Small focal uptake involving the anterior rib ends of the left second and right fourth ribs, new since the prior bone scan. The location of this uptake is more suggestive of a traumatic or inflammatory etiology as opposed to metastatic disease. No other evidence of metastatic disease. 2. There are other areas of stable uptake which are most likely degenerative/reactive, including right shoulder and cervical spine uptake and uptake along the right femur adjacent to the ORIF hardware.       HISTORY OF PRESENTING ILLNESS (From Dr. Mariana Kaufman note on 02/06/2016):  [name] 51 y.o. 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 She has had an excellent response to 6 cycles of taxotere herceptin perjeta from 07-30-15 thru 11-29-15. She is continuing herceptin and perjeta every 3 weeks. She had extreme local pain with first xgeva injection on 01-16-16 and prefers resuming previous zometa. She began letrozole ~ 01-16-16, has no complaints about this at all.  Last imaging was PET 12-25-15.  NOTE CTs, plain Xrays and bone scan did NOT show any of the extensively metastatic bone involvement 06-2015; MRI and PET did image disease clearly.  Patient continues to do very well overall. MRSA skin lesions on scalp and face improved quickly with doxycycline  starting 01-16-16. She continues to use Dial soap and we have again strongly encouraged good hygiene with other family members at home. She has had the 3 lymphedema PT sessions allowed by insurance, continues to do massage for LLE herself, still has not gotten the ordered compression apparatus for home and will follow up with PT on that. Swelling in LLE has improved with the interventions and does not seem as uncomfortable. She denies any pain. Appetite is excellent, bowels fine, no SOB, good energy, no problems with PAC, no noted changes in left  breast. No bleeding. No fever or symptoms of infection. She slipped in tub last week, fell onto right arm fortunately without injury. She was upset by a visitor and shaky then, but no tremors otherwise.   She has two children and 4 grandchildren. She has had genetic testing and it was negative. She lives at home with her husband. She is very active. Her children live close to her.   CURRENT THERAPY:  Letrozole 2.'5mg'$  daily, Herceptin and Perjeta every 3 weeks   INTERIM HISTORY: She returns today for follow up. The patient presented to the ED on 03/01/16 for a fall. She is recovering well.  The patient reports pain to her left leg which started about 2 weeks ago, and it is causing her to sleep poorly. She reports this pain is worse than before, 6-8/10 in severity. She tries to massage the area for relief, but it does not seem to help. The patient has been taking Advil without relief. She denies any other pain. She reports fatigue and low energy levels. She reports walking is difficulty due to fatigue and leg pain. She can complete some light cleaning and work at home. She is continuing to take Letrozole as directed.  The patient reports she is ready to return to work, though she does not feel she can. The patient worked in Scientist, research (medical) at [employer]. She requests SSDI paperwork to state she cannot return to work due to her health.   The patient is concerned about her quality and quantity of life, and questions what she can expect in the future.  The patient is accompanied by an ASL translator today.    MEDICAL HISTORY:  Past Medical History:  Diagnosis Date  . Abnormal Pap smear   . Anxiety   . Breast cancer ([place])   . Cancer ([place]) 1999   breast-s/p mastectomy, chemo, rad  . CIN I (cervical intraepithelial neoplasia I) 2003   by colpo  . Congenital deafness   . Deaf   . Depression   . Radiation 07/12/15-07/26/15   left femur 30 Gy  . S/P bilateral oophorectomy     SURGICAL HISTORY: Past  Surgical History:  Procedure Laterality Date  . ABDOMINAL HYSTERECTOMY    . INTRAMEDULLARY (IM) NAIL INTERTROCHANTERIC Left 06/26/2015   Procedure: LEFT BIOMET LONG AFFIXS NAIL;  Surgeon: [name], MD;  Location: [facility];  Service: Orthopedics;  Laterality: Left;  . IR GENERIC HISTORICAL  08/06/2015   IR US GUIDE VASC ACCESS LEFT 08/06/2015 Aletta Edouard, MD [facility]  . IR GENERIC HISTORICAL  08/06/2015   IR FLUORO GUIDE CV LINE LEFT 08/06/2015 Aletta Edouard, MD [facility]  . IR GENERIC HISTORICAL  03/05/2016   IR CV LINE INJECTION 03/05/2016 Markus Daft, MD [facility]  . MASTECTOMY     right breast  . MASTECTOMY    . OVARIAN CYST REMOVAL    . RADIOLOGY WITH ANESTHESIA Left 06/25/2015   Procedure: MRI OF  LEFT HIP WITH OR WITHOUT CONTRAST;  Surgeon: Medication Radiologist, MD;  Location: [facility];  Service: Radiology;  Laterality: Left;  DR. MCINTYRE/MRI  . TUBAL LIGATION      SOCIAL HISTORY: Social History   Social History  . Marital status: Married    Spouse name: N/A  . Number of children: 2  . Years of education: N/A   Occupational History  . Not on file.   Social History Main Topics  . Smoking status: Never Smoker  . Smokeless tobacco: Never Used  . Alcohol use No  . Drug use: No  . Sexual activity: Yes    Birth control/ protection: Surgical   Other Topics Concern  . Not on file   Social History Narrative  . No narrative on file    FAMILY HISTORY: Family History  Problem Relation Age of Onset  . Hypertension Mother   . Seizures Mother   . Alzheimer's disease Maternal Uncle   . Pancreatitis Maternal Grandmother   . Heart attack Maternal Grandfather     ALLERGIES:  is allergic to chlorhexidine; promethazine hcl; and diphenhydramine hcl.  MEDICATIONS:  Current Outpatient Prescriptions  Medication Sig Dispense Refill  . Cholecalciferol 2000 units CAPS Take 1 capsule by mouth daily.    . divalproex (DEPAKOTE) 250 MG DR tablet Take 1 tablet (250 mg  total) by mouth at bedtime. 30 tablet 2  . doxycycline (VIBRA-TABS) 100 MG tablet Take 1 tablet (100 mg total) by mouth 2 (two) times daily. 14 tablet 7  . HYDROcodone-acetaminophen (NORCO) 5-325 MG tablet Take 1 tablet by mouth every 6 (six) hours as needed for moderate pain. 30 tablet 0  . letrozole (FEMARA) 2.5 MG tablet Take 1 tablet (2.5 mg total) by mouth daily. 30 tablet 2  . lidocaine-prilocaine (EMLA) cream Apply 1 application topically as needed. Apply to Porta-Cath 1-2 hours prior to access as directed. 30 g 1  . loperamide (IMODIUM A-D) 2 MG tablet Take 1-2 tablets after each loose stool. UP TO 6 IN  24 HRS. (Patient not taking: Reported on 01/16/2016) 30 tablet 1  . LORazepam (ATIVAN) 1 MG tablet Place one tablet under the tongue or swallow every 4-6 hours as needed for nausea. (THIS IS THE SAME STRENGTH AS 2 OF YOUR 0.5 MG TABLETS) (Patient not taking: Reported on 01/16/2016) 30 tablet 0  . ondansetron (ZOFRAN) 8 MG tablet Take 1 tablet (8 mg total) by mouth every 8 (eight) hours as needed for nausea or vomiting. (Patient not taking: Reported on 01/16/2016) 30 tablet 0  . potassium chloride (K-DUR) 10 MEQ tablet Take 2 tablets (20 mEq total) by mouth daily. 60 tablet 1   No current facility-administered medications for this visit.    Facility-Administered Medications Ordered in Other Visits  Medication Dose Route Frequency Provider Last Rate Last Dose  . sodium chloride 0.9 % 1,000 mL with potassium chloride 10 mEq infusion   Intravenous Continuous Reece Packer, MD   Stopped at 09/10/15 1653  . sodium chloride 0.9 % injection 10 mL  10 mL Intravenous PRN Lennis Buzzy Han, MD        REVIEW OF SYSTEMS:   Constitutional: Denies fevers, chills or abnormal night sweats (+) Hot flashes (+) Pain in the back of her head Eyes: Denies blurriness of vision, double vision or watery eyes Ears, nose, mouth, throat, and face: Denies mucositis or sore throat (+) deaf Respiratory: Denies cough,  dyspnea or wheezes Cardiovascular: Denies palpitation, chest discomfort or lower extremity swelling Gastrointestinal:  Denies nausea, heartburn or change in bowel habits Skin: (+) Boil on the back of her head resolving Lymphatics: Denies new lymphadenopathy or easy bruising Neurological: (+) Left foot numbness. Behavioral/Psych: Mood is stable, no new changes  All other systems were reviewed with the patient and are negative.  PHYSICAL EXAMINATION: ECOG PERFORMANCE STATUS: 1  Vitals:   05/14/16 1122  BP: 120/62  Pulse: 65  Temp: 97.7 F (36.5 C)   Filed Weights   05/14/16 1122  Weight: 130 lb 9.6 oz (59.2 kg)   GENERAL:alert, no distress and comfortable SKIN: (+) Boil on the back of her scalp about 1.5 cm, no surrounding scalp erythema, resolving EYES: normal, conjunctiva are pink and non-injected, sclera clear OROPHARYNX:no exudate, no erythema and lips, buccal mucosa, and tongue normal  NECK: supple, thyroid normal size, non-tender, without nodularity LYMPH:  no palpable lymphadenopathy in the cervical, axillary or inguinal LUNGS: clear to auscultation and percussion with normal breathing effort HEART: regular rate & rhythm and no murmurs and no lower extremity edema ABDOMEN:abdomen soft, non-tender and normal bowel sounds Musculoskeletal:no cyanosis of digits and no clubbing  PSYCH: alert & oriented x 3 with fluent speech NEURO: no focal motor/sensory deficits Breasts: Breast inspection showed Right breast is surgically absent status post reconstruction. Palpation of the breasts and axilla revealed no obvious mass that I could appreciate.   LABORATORY DATA:  I have reviewed the data as listed CBC Latest Ref Rng & Units 05/14/2016 04/24/2016 04/03/2016  WBC 3.9 - 10.3 10e3/uL 3.9 5.3 4.9  Hemoglobin 11.6 - 15.9 g/dL 11.5(L) 11.3(L) 11.3(L)  Hematocrit 34.8 - 46.6 % 33.7(L) 33.3(L) 32.9(L)  Platelets 145 - 400 10e3/uL 192 216 229   CMP Latest Ref Rng & Units 05/14/2016  04/24/2016 04/03/2016  Glucose 70 - 140 mg/dl 90 94 102  BUN 7.0 - 26.0 mg/dL 9.0 7.8 6.0(L)  Creatinine 0.6 - 1.1 mg/dL 0.8 0.8 0.8  Sodium 136 - 145 mEq/L 141 140 141  Potassium 3.5 - 5.1 mEq/L 3.6 3.6 3.2(L)  Chloride 101 - 111 mmol/L - - -  CO2 22 - 29 mEq/L _0 Calcium 8.4 - 10.4 mg/dL 8.7 9.1 8.7  Total Protein 6.4 - 8.3 g/dL 6.5 6.7 6.6  Total Bilirubin 0.20 - 1.20 mg/dL 1.26(H) 1.25(H) 1.26(H)  Alkaline Phos 40 - 150 U/L 54 59 66  AST 5 - 34 U/L _1 ALT 0 - 55 U/L _2 PROCEDURES: Echocardiogram 01/27/16 Impressions: - Low normal GLS -15.9.  RADIOGRAPHIC STUDIES: I have personally reviewed the radiological images as listed and agreed with the findings in the report. Ct Chest W Contrast  Result Date: 05/01/2016 CLINICAL DATA:  Restaging breast cancer. EXAM: CT CHEST, ABDOMEN, AND PELVIS WITH CONTRAST TECHNIQUE: Multidetector CT imaging of the chest, abdomen and pelvis was performed following the standard protocol during bolus administration of intravenous contrast. CONTRAST:  142m ISOVUE-300 IOPAMIDOL (ISOVUE-300) INJECTION 61% COMPARISON:  PET-CT from 12/25/2015 FINDINGS: CT CHEST FINDINGS Cardiovascular: The heart size is mildly enlarged. No pericardial effusion. Mediastinum/Nodes: No enlarged mediastinal, hilar, or axillary lymph nodes. Similar appearance of low-attenuation nodule within right lobe of thyroid gland measures 1.5 cm. Lungs/Pleura: No pleural effusion identified. Stable left lower lobe nodule measuring 3 mm, image 4 series 4.No new or enlarging nodules or masses. Musculoskeletal: There are mixed lytic and sclerotic metastasis involving the posterior elements of T1 and the left first rib Status post right mastectomy CT ABDOMEN PELVIS FINDINGS Hepatobiliary: Low-attenuation structure within  the medial segment of left lobe is unchanged measuring 6 mm, image 50 of series 2. Favor small cyst. Enhancing structure within posterior right lobe of liver measures 8  mm and is unchanged from previous exam, image 45 of series 2. Presumed focal fatty deposition within the left lobe of liver about the falciform ligament. Stable adjacent low-density structure within the lateral segment of left lobe measuring 6 mm. Previous cholecystectomy. No biliary dilatation. Pancreas: Unremarkable. No pancreatic ductal dilatation or surrounding inflammatory changes. Spleen: Normal in size without focal abnormality. Adrenals/Urinary Tract: Adrenal glands are unremarkable. Kidneys are normal, without renal calculi, focal lesion, or hydronephrosis. Bladder is unremarkable. Stable hypodensity within the anterior cortex of left kidney measuring 7 mm, image 60 of series 2. Urinary bladder appears normal. Stomach/Bowel: The stomach is unremarkable. The small bowel loops have a normal course and caliber. No obstruction. Unremarkable appearance of the colon Vascular/Lymphatic: No significant vascular findings are present. No enlarged abdominal or pelvic lymph nodes. Reproductive: Uterus and bilateral adnexa are unremarkable. Other: No abdominal wall hernia or abnormality. No abdominopelvic ascites. Musculoskeletal: Previous open reduction and internal fixation of left femur. Asymmetric sclerosis in the left iliac wing is identified corresponding to an area of treated metastases. Lucent lesion within the right iliac bone is unchanged measuring 9 mm. Small sclerotic metastasis within the right iliac bone is unchanged, image 90 of series 2. No evidence for new or progressive bone metastases. IMPRESSION: 1. Stable exam.  No new or progressive disease identified. 2. No evidence for residual or recurrent adenopathy within the chest. 3. Stable bone metastasis. Electronically Signed   By: Kerby Moors M.D.   On: 05/01/2016 09:51   Nm Bone Scan Whole Body  Result Date: 05/01/2016 CLINICAL DATA:  History of metastatic breast carcinoma. No current bone pain. EXAM: NUCLEAR MEDICINE WHOLE BODY BONE SCAN  TECHNIQUE: Whole body anterior and posterior images were obtained approximately 3 hours after intravenous injection of radiopharmaceutical. RADIOPHARMACEUTICALS:  20.1 mCi Technetium-70mMDP IV COMPARISON:  PET-CT, 12/25/2015.  Bone scan, 07/01/2015. FINDINGS: There small focal areas of uptake which involves the left anterior second rib end in the right anterior fourth rib end, new since the prior bone scan. Uptake involving the left femur is similar to the prior study most likely reactive uptake related to the previous ORIF. There is a small area of focal uptake along the posterior aspect of the cervical spine which was present previously, likely degenerative. There is also stable uptake involving the right shoulder, which is likely degenerative. Renal uptake is symmetric. IMPRESSION: 1. Small focal uptake involving the anterior rib ends of the left second and right fourth ribs, new since the prior bone scan. The location of this uptake is more suggestive of a traumatic or inflammatory etiology as opposed to metastatic disease. No other evidence of metastatic disease. 2. There are other areas of stable uptake which are most likely degenerative/reactive, including right shoulder and cervical spine uptake and uptake along the right femur adjacent to the ORIF hardware. Electronically Signed   By: DLajean ManesM.D.   On: 05/01/2016 14:44   Ct Abdomen Pelvis W Contrast  Result Date: 05/01/2016 CLINICAL DATA:  Restaging breast cancer. EXAM: CT CHEST, ABDOMEN, AND PELVIS WITH CONTRAST TECHNIQUE: Multidetector CT imaging of the chest, abdomen and pelvis was performed following the standard protocol during bolus administration of intravenous contrast. CONTRAST:  1011mISOVUE-300 IOPAMIDOL (ISOVUE-300) INJECTION 61% COMPARISON:  PET-CT from 12/25/2015 FINDINGS: CT CHEST FINDINGS Cardiovascular: The heart size is mildly  enlarged. No pericardial effusion. Mediastinum/Nodes: No enlarged mediastinal, hilar, or axillary  lymph nodes. Similar appearance of low-attenuation nodule within right lobe of thyroid gland measures 1.5 cm. Lungs/Pleura: No pleural effusion identified. Stable left lower lobe nodule measuring 3 mm, image 4 series 4.No new or enlarging nodules or masses. Musculoskeletal: There are mixed lytic and sclerotic metastasis involving the posterior elements of T1 and the left first rib Status post right mastectomy CT ABDOMEN PELVIS FINDINGS Hepatobiliary: Low-attenuation structure within the medial segment of left lobe is unchanged measuring 6 mm, image 50 of series 2. Favor small cyst. Enhancing structure within posterior right lobe of liver measures 8 mm and is unchanged from previous exam, image 45 of series 2. Presumed focal fatty deposition within the left lobe of liver about the falciform ligament. Stable adjacent low-density structure within the lateral segment of left lobe measuring 6 mm. Previous cholecystectomy. No biliary dilatation. Pancreas: Unremarkable. No pancreatic ductal dilatation or surrounding inflammatory changes. Spleen: Normal in size without focal abnormality. Adrenals/Urinary Tract: Adrenal glands are unremarkable. Kidneys are normal, without renal calculi, focal lesion, or hydronephrosis. Bladder is unremarkable. Stable hypodensity within the anterior cortex of left kidney measuring 7 mm, image 60 of series 2. Urinary bladder appears normal. Stomach/Bowel: The stomach is unremarkable. The small bowel loops have a normal course and caliber. No obstruction. Unremarkable appearance of the colon Vascular/Lymphatic: No significant vascular findings are present. No enlarged abdominal or pelvic lymph nodes. Reproductive: Uterus and bilateral adnexa are unremarkable. Other: No abdominal wall hernia or abnormality. No abdominopelvic ascites. Musculoskeletal: Previous open reduction and internal fixation of left femur. Asymmetric sclerosis in the left iliac wing is identified corresponding to an area of  treated metastases. Lucent lesion within the right iliac bone is unchanged measuring 9 mm. Small sclerotic metastasis within the right iliac bone is unchanged, image 90 of series 2. No evidence for new or progressive bone metastases. IMPRESSION: 1. Stable exam.  No new or progressive disease identified. 2. No evidence for residual or recurrent adenopathy within the chest. 3. Stable bone metastasis. Electronically Signed   By: Kerby Moors M.D.   On: 05/01/2016 09:51   ASSESSMENT & PLAN:  Mrs. Quintela is a 51 y.o. postmenopause female with:  1. Metastatic ER+ HER 2+ right breast cancer involving bone -I previously reviewed her medical records extensively, and confirmed the points with patient, sees the oncology history summary above. -Restaging PET 12-25-15 markedly improved of bone metastasis after initial systemic chemo  -Letrozole begun 01-16-16, continue herceptin and perjeta.  -Did not tolerate SQ xgeva and it was changed to zometa monthly, will change it to evey 6 weeks to match her infusion  -Echocardiogram 01-27-16 showed normal EF, will continue monitoring every 3 months when she is on herceptin -Echo due, she will schedule it asap  -I reviewed her CT scan and bone scan from 05/01/2016, which showed stable disease overall, a few new uptake of anterior ribs on bone scan are indeterminate. -She has developed worsening left hip pain, and more fatigued lately. I have clinical concern of disease progression. 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 This was previously denied by her insurance. -continue letrezole and Herceptin/pejeta - The patient may restart chemo depending on the results of PETscans. We discussed this today. - Tumor marker CA27.29 slightly increased from 2 months ago. Was 37.9 on 03/05/16, now is 55.2 on 05/14/16. This dropped following the last round of chemotherapy.  2. History of  MRSA skin infection scalp, boil at low back scalp and upper  lip -I prescribed doxycycline on 04/24/16 for 7 days  - Resolved now.  3.congenital deafness -needs sign language interpreter for all visits.  -Husband also deaf.  4. premature surgical menopause -post BSO 2071fr pelvic pain and ovarian cysts, likely was also beneficial from standpoint of the breast cancer. -hysterectomy.   5. Intermittent anemia  -mild, possible related to prior chemo -She is asymptomatic, we'll continue close monitoring  6. Hypokalemia  -K 3.1 on 03/05/16, she is on KCL KCL 246m daily, increased to 4063mdaily for a week. -K 3.2 on 04/03/16. The patient is on KCl 8m42maily, increased to 40me29mily for a week and then take 1 pill a day as normal. - K 3.6 today, 05/14/16. -resolved now, continue KCL   7. Goal of care discussion  -We previously discussed the incurable nature of her cancer, and the overall poor prognosis, especially if she does not have good response to chemotherapy or progress on chemo. We also discussed that she has had excellent response to chemo, and her disease may be controlled for long time. -The patient understands the goal of care is palliative. -she is full code for now   8. Left Leg Pain -worsening left leg and hip pain, 6-8/10 in severity, this was much improved after her prior chemo - The patient previously took Hydrocodone for this pain, which helped. - I will refill Hydrocodone today, 5/3. She will take 1 tablet every 6 hours as needed for pain. -We'll get a PET scan for further evaluation  Plan: - We reviewed the patient's recent labs and scans. Adequate for treatment, will proceed with Herceptin and Perjeta today. - Refill Hydrocodone today. The patient will take this as directed for pain. - I will provide SSDI letter/documentation for work per the patient's request. - Order PET scan for restaging purposes.  - Follow up in 2 weeks at 8:15 am to review scan results and discuss treatment options. - Labs and infusion of Herceptin  and Perjeta in 3 weeks.   All questions were answered. The patient knows to call the clinic with any problems, questions or concerns.  I spent 25 minutes counseling the patient face to face. The total time spent in the appointment was 30 minutes and more than 50% was on counseling.  This document serves as a record of services personally performed by Kacen Mellinger FTruitt Merle It was created on her behalf by SarahMaryla Morrowrained medical scribe. The creation of this record is based on the scribe's personal observations and the provider's statements to them. This document has been checked and approved by the attending provider.   Makella Buckingham,Truitt Merle5/03/2016

## 2016-05-14 ENCOUNTER — Encounter: Payer: Self-pay | Admitting: Hematology

## 2016-05-14 ENCOUNTER — Other Ambulatory Visit (HOSPITAL_BASED_OUTPATIENT_CLINIC_OR_DEPARTMENT_OTHER): Payer: Medicaid Other

## 2016-05-14 ENCOUNTER — Ambulatory Visit: Payer: Medicaid Other

## 2016-05-14 ENCOUNTER — Telehealth: Payer: Self-pay | Admitting: Hematology

## 2016-05-14 ENCOUNTER — Ambulatory Visit (HOSPITAL_BASED_OUTPATIENT_CLINIC_OR_DEPARTMENT_OTHER): Payer: Medicaid Other

## 2016-05-14 ENCOUNTER — Ambulatory Visit (HOSPITAL_BASED_OUTPATIENT_CLINIC_OR_DEPARTMENT_OTHER): Payer: Medicaid Other | Admitting: Hematology

## 2016-05-14 VITALS — BP 120/62 | HR 65 | Temp 97.7°F | Ht 67.0 in | Wt 130.6 lb

## 2016-05-14 DIAGNOSIS — Z170 Estrogen receptor positive status [ER+]: Secondary | ICD-10-CM | POA: Diagnosis not present

## 2016-05-14 DIAGNOSIS — Z95828 Presence of other vascular implants and grafts: Secondary | ICD-10-CM

## 2016-05-14 DIAGNOSIS — Z5112 Encounter for antineoplastic immunotherapy: Secondary | ICD-10-CM

## 2016-05-14 DIAGNOSIS — C7951 Secondary malignant neoplasm of bone: Secondary | ICD-10-CM | POA: Diagnosis not present

## 2016-05-14 DIAGNOSIS — D649 Anemia, unspecified: Secondary | ICD-10-CM

## 2016-05-14 DIAGNOSIS — M79605 Pain in left leg: Secondary | ICD-10-CM

## 2016-05-14 DIAGNOSIS — C50911 Malignant neoplasm of unspecified site of right female breast: Secondary | ICD-10-CM

## 2016-05-14 DIAGNOSIS — E28319 Asymptomatic premature menopause: Secondary | ICD-10-CM

## 2016-05-14 DIAGNOSIS — H905 Unspecified sensorineural hearing loss: Secondary | ICD-10-CM

## 2016-05-14 DIAGNOSIS — C50919 Malignant neoplasm of unspecified site of unspecified female breast: Secondary | ICD-10-CM

## 2016-05-14 DIAGNOSIS — C799 Secondary malignant neoplasm of unspecified site: Principal | ICD-10-CM

## 2016-05-14 LAB — CBC WITH DIFFERENTIAL/PLATELET
BASO%: 0.5 % (ref 0.0–2.0)
BASOS ABS: 0 10*3/uL (ref 0.0–0.1)
EOS%: 4.1 % (ref 0.0–7.0)
Eosinophils Absolute: 0.2 10*3/uL (ref 0.0–0.5)
HCT: 33.7 % — ABNORMAL LOW (ref 34.8–46.6)
HEMOGLOBIN: 11.5 g/dL — AB (ref 11.6–15.9)
LYMPH#: 1.6 10*3/uL (ref 0.9–3.3)
LYMPH%: 40.6 % (ref 14.0–49.7)
MCH: 29.5 pg (ref 25.1–34.0)
MCHC: 34.1 g/dL (ref 31.5–36.0)
MCV: 86.4 fL (ref 79.5–101.0)
MONO#: 0.3 10*3/uL (ref 0.1–0.9)
MONO%: 7.7 % (ref 0.0–14.0)
NEUT#: 1.9 10*3/uL (ref 1.5–6.5)
NEUT%: 47.1 % (ref 38.4–76.8)
NRBC: 0 % (ref 0–0)
PLATELETS: 192 10*3/uL (ref 145–400)
RBC: 3.9 10*6/uL (ref 3.70–5.45)
RDW: 12.8 % (ref 11.2–14.5)
WBC: 3.9 10*3/uL (ref 3.9–10.3)

## 2016-05-14 LAB — COMPREHENSIVE METABOLIC PANEL
ALBUMIN: 3.6 g/dL (ref 3.5–5.0)
ALK PHOS: 54 U/L (ref 40–150)
ALT: 9 U/L (ref 0–55)
AST: 9 U/L (ref 5–34)
Anion Gap: 7 mEq/L (ref 3–11)
BILIRUBIN TOTAL: 1.26 mg/dL — AB (ref 0.20–1.20)
BUN: 9 mg/dL (ref 7.0–26.0)
CO2: 27 mEq/L (ref 22–29)
Calcium: 8.7 mg/dL (ref 8.4–10.4)
Chloride: 107 mEq/L (ref 98–109)
Creatinine: 0.8 mg/dL (ref 0.6–1.1)
EGFR: 90 mL/min/{1.73_m2} — AB (ref >90)
GLUCOSE: 90 mg/dL (ref 70–140)
Potassium: 3.6 mEq/L (ref 3.5–5.1)
Sodium: 141 mEq/L (ref 136–145)
TOTAL PROTEIN: 6.5 g/dL (ref 6.4–8.3)

## 2016-05-14 MED ORDER — ACETAMINOPHEN 325 MG PO TABS
650.0000 mg | ORAL_TABLET | Freq: Once | ORAL | Status: AC
  Administered 2016-05-14: 650 mg via ORAL

## 2016-05-14 MED ORDER — SODIUM CHLORIDE 0.9 % IV SOLN
6.0000 mg/kg | Freq: Once | INTRAVENOUS | Status: AC
  Administered 2016-05-14: 357 mg via INTRAVENOUS
  Filled 2016-05-14: qty 17

## 2016-05-14 MED ORDER — ZOLEDRONIC ACID 4 MG/100ML IV SOLN
4.0000 mg | Freq: Once | INTRAVENOUS | Status: AC
  Administered 2016-05-14: 4 mg via INTRAVENOUS
  Filled 2016-05-14: qty 100

## 2016-05-14 MED ORDER — ACETAMINOPHEN 325 MG PO TABS
ORAL_TABLET | ORAL | Status: AC
  Filled 2016-05-14: qty 2

## 2016-05-14 MED ORDER — SODIUM CHLORIDE 0.9% FLUSH
10.0000 mL | INTRAVENOUS | Status: DC | PRN
  Administered 2016-05-14: 10 mL via INTRAVENOUS
  Filled 2016-05-14: qty 10

## 2016-05-14 MED ORDER — SODIUM CHLORIDE 0.9 % IV SOLN
Freq: Once | INTRAVENOUS | Status: AC
  Administered 2016-05-14: 12:00:00 via INTRAVENOUS

## 2016-05-14 MED ORDER — SODIUM CHLORIDE 0.9 % IV SOLN
210.0000 mg | Freq: Once | INTRAVENOUS | Status: AC
  Administered 2016-05-14: 210 mg via INTRAVENOUS
  Filled 2016-05-14: qty 7

## 2016-05-14 MED ORDER — HEPARIN SOD (PORK) LOCK FLUSH 100 UNIT/ML IV SOLN
500.0000 [IU] | Freq: Once | INTRAVENOUS | Status: AC | PRN
  Administered 2016-05-14: 500 [IU]
  Filled 2016-05-14: qty 5

## 2016-05-14 MED ORDER — SODIUM CHLORIDE 0.9% FLUSH
10.0000 mL | INTRAVENOUS | Status: DC | PRN
  Administered 2016-05-14: 10 mL
  Filled 2016-05-14: qty 10

## 2016-05-14 MED ORDER — LORATADINE 10 MG PO TABS
10.0000 mg | ORAL_TABLET | Freq: Every day | ORAL | Status: DC
  Administered 2016-05-14: 10 mg via ORAL
  Filled 2016-05-14: qty 1

## 2016-05-14 MED ORDER — HYDROCODONE-ACETAMINOPHEN 5-325 MG PO TABS: 1.0000 | ORAL_TABLET | Freq: Four times a day (QID) | ORAL | 0 refills | Status: DC | PRN

## 2016-05-14 NOTE — Patient Instructions (Signed)
Implanted Port Home Guide An implanted port is a type of central line that is placed under the skin. Central lines are used to provide IV access when treatment or nutrition needs to be given through a person's veins. Implanted ports are used for long-term IV access. An implanted port may be placed because:  You need IV medicine that would be irritating to the small veins in your hands or arms.  You need long-term IV medicines, such as antibiotics.  You need IV nutrition for a long period.  You need frequent blood draws for lab tests.  You need dialysis.  Implanted ports are usually placed in the chest area, but they can also be placed in the upper arm, the abdomen, or the leg. An implanted port has two main parts:  Reservoir. The reservoir is round and will appear as a small, raised area under your skin. The reservoir is the part where a needle is inserted to give medicines or draw blood.  Catheter. The catheter is a thin, flexible tube that extends from the reservoir. The catheter is placed into a large vein. Medicine that is inserted into the reservoir goes into the catheter and then into the vein.  How will I care for my incision site? Do not get the incision site wet. Bathe or shower as directed by your health care provider. How is my port accessed? Special steps must be taken to access the port:  Before the port is accessed, a numbing cream can be placed on the skin. This helps numb the skin over the port site.  Your health care provider uses a sterile technique to access the port. ? Your health care provider must put on a mask and sterile gloves. ? The skin over your port is cleaned carefully with an antiseptic and allowed to dry. ? The port is gently pinched between sterile gloves, and a needle is inserted into the port.  Only "non-coring" port needles should be used to access the port. Once the port is accessed, a blood return should be checked. This helps ensure that the port  is in the vein and is not clogged.  If your port needs to remain accessed for a constant infusion, a clear (transparent) bandage will be placed over the needle site. The bandage and needle will need to be changed every week, or as directed by your health care provider.  Keep the bandage covering the needle clean and dry. Do not get it wet. Follow your health care provider's instructions on how to take a shower or bath while the port is accessed.  If your port does not need to stay accessed, no bandage is needed over the port.  What is flushing? Flushing helps keep the port from getting clogged. Follow your health care provider's instructions on how and when to flush the port. Ports are usually flushed with saline solution or a medicine called heparin. The need for flushing will depend on how the port is used.  If the port is used for intermittent medicines or blood draws, the port will need to be flushed: ? After medicines have been given. ? After blood has been drawn. ? As part of routine maintenance.  If a constant infusion is running, the port may not need to be flushed.  How long will my port stay implanted? The port can stay in for as long as your health care provider thinks it is needed. When it is time for the port to come out, surgery will be   done to remove it. The procedure is similar to the one performed when the port was put in. When should I seek immediate medical care? When you have an implanted port, you should seek immediate medical care if:  You notice a bad smell coming from the incision site.  You have swelling, redness, or drainage at the incision site.  You have more swelling or pain at the port site or the surrounding area.  You have a fever that is not controlled with medicine.  This information is not intended to replace advice given to you by your health care provider. Make sure you discuss any questions you have with your health care provider. Document  Released: 12/29/2004 Document Revised: 06/06/2015 Document Reviewed: 09/05/2012 Elsevier Interactive Patient Education  2017 [employer]  

## 2016-05-14 NOTE — Telephone Encounter (Signed)
Gave patient AVS and calender per interpreter [facility] per patient wanted her to come pick schedule because she was in the treatment area. Scheduled appts per 5/3 - Unable to contact ECHO - will try again later and confirm appt will be made.

## 2016-05-14 NOTE — Patient Instructions (Signed)
[facility] Discharge Instructions for Patients Receiving Chemotherapy  Today you received the following chemotherapy agents:Zometa, Herceptin and Perjeta   To help prevent nausea and vomiting after your treatment, we encourage you to take your nausea medication as directed.    If you develop nausea and vomiting that is not controlled by your nausea medication, call the clinic.   BELOW ARE SYMPTOMS THAT SHOULD BE REPORTED IMMEDIATELY:  *FEVER GREATER THAN 100.5 F  *CHILLS WITH OR WITHOUT FEVER  NAUSEA AND VOMITING THAT IS NOT CONTROLLED WITH YOUR NAUSEA MEDICATION  *UNUSUAL SHORTNESS OF BREATH  *UNUSUAL BRUISING OR BLEEDING  TENDERNESS IN MOUTH AND THROAT WITH OR WITHOUT PRESENCE OF ULCERS  *URINARY PROBLEMS  *BOWEL PROBLEMS  UNUSUAL RASH Items with * indicate a potential emergency and should be followed up as soon as possible.  Feel free to call the clinic you have any questions or concerns. The clinic phone number is [phone].  Please show the [facility] at check-in to the Emergency Department and triage nurse.  Zoledronic Acid injection (Hypercalcemia, Oncology) What is this medicine? ZOLEDRONIC ACID (ZOE le dron ik AS id) lowers the amount of calcium loss from bone. It is used to treat too much calcium in your blood from cancer. It is also used to prevent complications of cancer that has spread to the bone. This medicine may be used for other purposes; ask your health care provider or pharmacist if you have questions. COMMON BRAND NAME(S): Zometa What should I tell my health care provider before I take this medicine? They need to know if you have any of these conditions: -aspirin-sensitive asthma -cancer, especially if you are receiving medicines used to treat cancer -dental disease or wear dentures -infection -kidney disease -receiving corticosteroids like dexamethasone or prednisone -an unusual or allergic reaction to zoledronic acid,  other medicines, foods, dyes, or preservatives -pregnant or trying to get pregnant -breast-feeding How should I use this medicine? This medicine is for infusion into a vein. It is given by a health care professional in a hospital or clinic setting. Talk to your pediatrician regarding the use of this medicine in children. Special care may be needed. Overdosage: If you think you have taken too much of this medicine contact a poison control center or emergency room at once. NOTE: This medicine is only for you. Do not share this medicine with others. What if I miss a dose? It is important not to miss your dose. Call your doctor or health care professional if you are unable to keep an appointment. What may interact with this medicine? -certain antibiotics given by injection -NSAIDs, medicines for pain and inflammation, like ibuprofen or naproxen -some diuretics like bumetanide, furosemide -teriparatide -thalidomide This list may not describe all possible interactions. Give your health care provider a list of all the medicines, herbs, non-prescription drugs, or dietary supplements you use. Also tell them if you smoke, drink alcohol, or use illegal drugs. Some items may interact with your medicine. What should I watch for while using this medicine? Visit your doctor or health care professional for regular checkups. It may be some time before you see the benefit from this medicine. Do not stop taking your medicine unless your doctor tells you to. Your doctor may order blood tests or other tests to see how you are doing. Women should inform their doctor if they wish to become pregnant or think they might be pregnant. There is a potential for serious side effects to an unborn  child. Talk to your health care professional or pharmacist for more information. You should make sure that you get enough calcium and vitamin D while you are taking this medicine. Discuss the foods you eat and the vitamins you take  with your health care professional. Some people who take this medicine have severe bone, joint, and/or muscle pain. This medicine may also increase your risk for jaw problems or a broken thigh bone. Tell your doctor right away if you have severe pain in your jaw, bones, joints, or muscles. Tell your doctor if you have any pain that does not go away or that gets worse. Tell your dentist and dental surgeon that you are taking this medicine. You should not have major dental surgery while on this medicine. See your dentist to have a dental exam and fix any dental problems before starting this medicine. Take good care of your teeth while on this medicine. Make sure you see your dentist for regular follow-up appointments. What side effects may I notice from receiving this medicine? Side effects that you should report to your doctor or health care professional as soon as possible: -allergic reactions like skin rash, itching or hives, swelling of the face, lips, or tongue -anxiety, confusion, or depression -breathing problems -changes in vision -eye pain -feeling faint or lightheaded, falls -jaw pain, especially after dental work -mouth sores -muscle cramps, stiffness, or weakness -redness, blistering, peeling or loosening of the skin, including inside the mouth -trouble passing urine or change in the amount of urine Side effects that usually do not require medical attention (report to your doctor or health care professional if they continue or are bothersome): -bone, joint, or muscle pain -constipation -diarrhea -fever -hair loss -irritation at site where injected -loss of appetite -nausea, vomiting -stomach upset -trouble sleeping -trouble swallowing -weak or tired This list may not describe all possible side effects. Call your doctor for medical advice about side effects. You may report side effects to FDA at 1-800-FDA-1088. Where should I keep my medicine? This drug is given in a hospital  or clinic and will not be stored at home. NOTE: This sheet is a summary. It may not cover all possible information. If you have questions about this medicine, talk to your doctor, pharmacist, or health care provider.  2017 [employer]vier/[employer] Standard (2013-05-27 14:19:39)

## 2016-05-15 NOTE — Telephone Encounter (Signed)
Contacted ECHO - per Sonia Side she will contact patient with new appt to schedule - she will use the old order.

## 2016-05-16 ENCOUNTER — Encounter: Payer: Self-pay | Admitting: Hematology

## 2016-05-20 ENCOUNTER — Encounter (HOSPITAL_COMMUNITY)
Admission: RE | Admit: 2016-05-20 | Discharge: 2016-05-20 | Disposition: A | Discharge status: Home / Self Care | Payer: Medicaid Other | Source: Ambulatory Visit | Attending: Hematology | Admitting: Hematology

## 2016-05-20 DIAGNOSIS — C7951 Secondary malignant neoplasm of bone: Secondary | ICD-10-CM | POA: Insufficient documentation

## 2016-05-20 DIAGNOSIS — C50911 Malignant neoplasm of unspecified site of right female breast: Secondary | ICD-10-CM | POA: Insufficient documentation

## 2016-05-20 LAB — GLUCOSE, CAPILLARY: GLUCOSE-CAPILLARY: 91 mg/dL (ref 65–99)

## 2016-05-20 MED ORDER — FLUDEOXYGLUCOSE F - 18 (FDG) INJECTION
6.3700 | Freq: Once | INTRAVENOUS | Status: AC | PRN
  Administered 2016-05-20: 6.37 via INTRAVENOUS

## 2016-05-21 ENCOUNTER — Ambulatory Visit (HOSPITAL_COMMUNITY)
Admission: RE | Admit: 2016-05-21 | Discharge: 2016-05-21 | Disposition: A | Discharge status: Home / Self Care | Payer: Medicaid Other | Source: Ambulatory Visit | Attending: Hematology | Admitting: Hematology

## 2016-05-21 ENCOUNTER — Telehealth: Payer: Self-pay

## 2016-05-21 DIAGNOSIS — C50911 Malignant neoplasm of unspecified site of right female breast: Secondary | ICD-10-CM | POA: Insufficient documentation

## 2016-05-21 DIAGNOSIS — C7951 Secondary malignant neoplasm of bone: Secondary | ICD-10-CM | POA: Diagnosis not present

## 2016-05-21 NOTE — Progress Notes (Signed)
  Echocardiogram 2D Echocardiogram has been performed.  [name] 05/21/2016, 8:43 AM

## 2016-05-21 NOTE — Telephone Encounter (Signed)
Pt requested results of PET done yesterday. Gave her results. Told pt she will have to discuss with Dr Burr Medico on what this means in comparison to previous exams and how it will be managed. She has appt on 5/17.

## 2016-05-22 ENCOUNTER — Telehealth: Payer: Self-pay | Admitting: Hematology

## 2016-05-22 ENCOUNTER — Ambulatory Visit (HOSPITAL_BASED_OUTPATIENT_CLINIC_OR_DEPARTMENT_OTHER): Payer: Medicaid Other | Admitting: Hematology

## 2016-05-22 ENCOUNTER — Telehealth: Payer: Self-pay | Admitting: *Deleted

## 2016-05-22 VITALS — BP 121/64 | HR 74 | Temp 97.9°F | Resp 18 | Ht 67.0 in | Wt 129.6 lb

## 2016-05-22 DIAGNOSIS — M79605 Pain in left leg: Secondary | ICD-10-CM

## 2016-05-22 DIAGNOSIS — C50911 Malignant neoplasm of unspecified site of right female breast: Secondary | ICD-10-CM

## 2016-05-22 DIAGNOSIS — E876 Hypokalemia: Secondary | ICD-10-CM | POA: Diagnosis not present

## 2016-05-22 DIAGNOSIS — Z170 Estrogen receptor positive status [ER+]: Secondary | ICD-10-CM

## 2016-05-22 DIAGNOSIS — C7951 Secondary malignant neoplasm of bone: Secondary | ICD-10-CM

## 2016-05-22 DIAGNOSIS — H905 Unspecified sensorineural hearing loss: Secondary | ICD-10-CM | POA: Diagnosis not present

## 2016-05-22 DIAGNOSIS — E28319 Asymptomatic premature menopause: Secondary | ICD-10-CM

## 2016-05-22 NOTE — Telephone Encounter (Signed)
lvm to inform pt of Dr Ernestina Penna request for an appt today to discuss scan results on 5/11 at 1230 pm or 430 pm per sch msg. Asked pt to return call to office to confirm or deny appt

## 2016-05-22 NOTE — Telephone Encounter (Signed)
I have asked scheduler to call her to see if she wants to come in today.   Truitt Merle MD

## 2016-05-22 NOTE — Telephone Encounter (Signed)
Pt returned call per " need for an appointment " - [name] states she just woke up and is hoping appointment can be later in the day. Note call was thru an interpreting service per pt is hearing impaired.  Per note by Dr Burr Medico pt can come in today at 430.  Abigail Butts requested deaf interpreter Natale Milch ONLY- request placed on urgent LOS sent to scheduling.

## 2016-05-22 NOTE — Progress Notes (Signed)
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 She had mastectomy with the 7 axillary node evaluation, 4 cycles of adriamycin/ cytoxan followed by taxotere, then five years of tamoxifen thru June 2005 (no herceptin in 1999). She had bilateral oophorectomy in May 2005. She was briefly on aromatase inhibitor after tamoxifen, but discontinued this herself due to poor tolerance.      04/04/2015 Genetic Testing    Negative genetic testing on the breast/ovarian cancer pnael.  The Breast/Ovarian gene panel offered by [facility] includes sequencing and rearrangement analysis for the following 20 genes:  ATM, BARD1, BRCA1, BRCA2, BRIP1, CDH1, CHEK2, EPCAM, FANCC, MLH1, MSH2, MSH6, NBN, PALB2, PMS2, PTEN, RAD51C, RAD51D, TP53, and XRCC2.      06/26/2015 Pathology Results    Initial Path Report Bone, curettage, Left femur met, intramedullary subtroch tissue - METASTATIC ADENOCARCINOMA.  Estrogen Receptor: 95%, POSITIVE, STRONG STAINING INTENSITY Progesterone Receptor: 2%, POSITIVE, STRONG       06/26/2015 Surgery    Biopsy of metastatic tissue and stabilization with Affixus trochanteric nail, proximal and distal interlock of left femur by Dr. [name]       06/28/2015 Imaging    CT Chest w/ Contrast 1. Extensive right internal mammary chain lymphadenopathy consistent with metastatic breast cancer. 2. Lytic metastases involving the posterior elements at T1 with probable nondisplaced pathologic fracture of the spinous process. 3. No other evidence of  thoracic metastatic disease. 4. Trace bilateral pleural effusions with associated bibasilar atelectasis. 5. Indeterminate right thyroid nodule, not previously imaged. This could be evaluated with thyroid ultrasound as clinically warranted.      07/01/2015 Imaging    Bone Scan 1. Increased activity noted throughout the left femur. Although metastatic disease cannot be excluded, these changes are most likely secondary to prior surgery. 2. No other focal abnormalities identified to suggest metastatic disease.      07/12/2015 - 07/26/2015 Radiation Therapy    Left femur, 30 Gy in 10 fractions by Dr. Sondra Come       07/14/2015 Initial Diagnosis    [name], right ([place])     07/19/2015 Imaging    Initial PET Scan 1. Severe multifocal osseous metastatic disease, much greater than anticipated based on prior imaging. 2. Multifocal nodal metastases involving the right internal mammary, prevascular and subpectoral lymph nodes. No axillary pulmonary involvement identified. 3. No distant extra osseous metastases.      07/30/2015 - 11/29/2015 Chemotherapy    Docetaxel, Herceptin and Perjeta every 3 weeks X6 cycles, pt tolerated moderately well, restaging scan showed excellent response       09/18/2015 Imaging    CT Head w/wo Contrast 1. No acute intracranial abnormality or significant interval change. 2. Stable minimal periventricular white matter hypoattenuation on the right. This may reflect a remote ischemic injury. 3. No evidence for metastatic disease to the brain. 4. No focal soft tissue lesion to explain the patient's tenderness.      10/30/2015 Imaging    CT Abdomen Pelvis w/ Contrast 1. Few mildly prominent  fluid-filled loops of small bowel scattered throughout the abdomen, with intraluminal fluid density within the distal colon. Given the provided history, findings are suggestive of acute enteritis/diarrheal illness. 2. No other acute intra-abdominal or pelvic  process identified. 3. Widespread osseous metastatic disease, better evaluated on most recent PET-CT from 07/19/2015. No pathologic fracture or other complication. 4. 11 mm hypodensity within the left kidney. This lesion measures intermediate density, and is indeterminate. While this lesion is similar in size relative to recent studies, this is increased in size relative to prior study from 2012. Further evaluation with dedicated renal mass protocol CT and/or MRI is recommended for complete characterization.      12/25/2015 Imaging    Restaging PET Scan 1. Markedly improved skeletal activity, with only several faint foci of residual accentuated metabolic activity, but resolution of the vast majority of the previously extensive osseous metastatic disease. 2. Resolution of the prior right internal mammary and right prevascular lymph nodes. 3. Residual subcutaneous edema and edema along fascia planes in the left upper thigh. This remains somewhat more than I would expect for placement of an IM nail 6 weeks ago. If there is leg swelling further down, consider Doppler venous ultrasound to rule out left lower extremity DVT. I do not perceive an obvious difference in density between the pelvic and common femoral veins on the noncontrast CT data. 4. Chronic right maxillary and right sphenoid sinusitis.      12/2015 -  Chemotherapy    Maintenance Herceptin and pejeta every 3 weeks      01/16/2016 -  Anti-estrogen oral therapy    Letrozole 2.5 mg daily      03/01/2016 Miscellaneous    Patient presented to ED following a fall; she reports she landed on her back and is now experiencing back pain. The patient was evaluated and discharge home same day with pain control.      05/01/2016 Imaging    CT CAP IMPRESSION: 1. Stable exam.  No new or progressive disease identified. 2. No evidence for residual or recurrent adenopathy within the chest. 3. Stable bone metastasis.      05/01/2016 Imaging    BONE  SCAN IMPRESSION: 1. Small focal uptake involving the anterior rib ends of the left second and right fourth ribs, new since the prior bone scan. The location of this uptake is more suggestive of a traumatic or inflammatory etiology as opposed to metastatic disease. No other evidence of metastatic disease. 2. There are other areas of stable uptake which are most likely degenerative/reactive, including right shoulder and cervical spine uptake and uptake along the right femur adjacent to the ORIF hardware.      05/20/2016 Imaging    NM PET Skull to Thigh IMPRESSION: 1. There multiple metastatic foci in the bony pelvis, including some new abnormal foci compare to the prior PET-CT, compatible with mildly progressive bony metastatic disease. Also, a left T11 vertebral hypermetabolic metastatic lesion is observed, new compared to the prior exam. 2. No active extraosseous malignancy is currently identified. 3. Right anterior fourth rib healing fracture, appears be       HISTORY OF PRESENTING ILLNESS (From Dr. Mariana Kaufman note on 02/06/2016):  [name] 51 y.o. 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 She has had an excellent response to 6 cycles of taxotere herceptin perjeta from 07-30-15 thru 11-29-15. She is continuing herceptin and perjeta every 3 weeks. She had extreme local  pain with first xgeva injection on 01-16-16 and prefers resuming previous zometa. She began letrozole ~ 01-16-16, has no complaints about this at all.  Last imaging was PET 12-25-15.  NOTE CTs, plain Xrays and bone scan did NOT show any of the extensively metastatic bone involvement 06-2015; MRI and PET did image disease clearly.  Patient continues to do very well overall. MRSA skin lesions on scalp and face improved quickly with doxycycline starting 01-16-16. She continues to use Dial soap and we have again strongly  encouraged good hygiene with other family members at home. She has had the 3 lymphedema PT sessions allowed by insurance, continues to do massage for LLE herself, still has not gotten the ordered compression apparatus for home and will follow up with PT on that. Swelling in LLE has improved with the interventions and does not seem as uncomfortable. She denies any pain. Appetite is excellent, bowels fine, no SOB, good energy, no problems with PAC, no noted changes in left breast. No bleeding. No fever or symptoms of infection. She slipped in tub last week, fell onto right arm fortunately without injury. She was upset by a visitor and shaky then, but no tremors otherwise.   She has two children and 4 grandchildren. She has had genetic testing and it was negative. She lives at home with her husband. She is very active. Her children live close to her.   CURRENT THERAPY:  Letrozole 2.50m daily, Herceptin and Perjeta every 3 weeks   INTERIM HISTORY: She returns today for follow up. She is doing okay  Pain of Left hip is going away and denies any new pain. She returns to discuss her recent PET scan findings. She is accompanied by her husband.  Interpreter is present via computer.    MEDICAL HISTORY:  Past Medical History:  Diagnosis Date  . Abnormal Pap smear   . Anxiety   . Breast cancer ([place]   . Cancer ([place]   breast-s/p mastectomy, chemo, rad  . CIN I (cervical intraepithelial neoplasia I) 2003   by colpo  . Congenital deafness   . Deaf   . Depression   . Radiation 07/12/15-07/26/15   left femur 30 Gy  . S/P bilateral oophorectomy     SURGICAL HISTORY: Past Surgical History:  Procedure Laterality Date  . ABDOMINAL HYSTERECTOMY    . INTRAMEDULLARY (IM) NAIL INTERTROCHANTERIC Left 06/26/2015   Procedure: LEFT BIOMET LONG AFFIXS NAIL;  Surgeon: MMarybelle Killings MD;  [facility]  Service: Orthopedics;  Laterality: Left;  . IR GENERIC HISTORICAL  08/06/2015   IR [place] ACC[place]  LEFT 08/06/2015 GAletta Edouard MD [facility]INTER[facility]  . IR GENERIC HISTORICAL  08/06/2015   IR FLUORO GUIDE CV LINE LEFT 08/06/2015 GAletta Edouard MD [facility]  . IR GENERIC HISTORICAL  03/05/2016   IR CV LINE INJECTION 03/05/2016 AMarkus Daft MD [facility]  . MASTECTOMY     right breast  . MASTECTOMY    . OVARIAN CYST REMOVAL    . RADIOLOGY WITH ANESTHESIA Left 06/25/2015   Procedure: MRI OF LEFT HIP WITH OR WITHOUT CONTRAST;  Surgeon: Medication Radiologist, MD;  [facility]  Service: Radiology;  Laterality: Left;  DR. MCINTYRE/MRI  . TUBAL LIGATION      SOCIAL HISTORY: Social History   Social History  . Marital status: Married    Spouse name: N/A  . Number of children: 2  . Years of education: N/A   Occupational History  . Not on  file.   Social History Main Topics  . Smoking status: Never Smoker  . Smokeless tobacco: Never Used  . Alcohol use No  . Drug use: No  . Sexual activity: Yes    Birth control/ protection: Surgical   Other Topics Concern  . Not on file   Social History Narrative  . No narrative on file    FAMILY HISTORY: Family History  Problem Relation Age of Onset  . Hypertension Mother   . Seizures Mother   . Alzheimer's disease Maternal Uncle   . Pancreatitis Maternal Grandmother   . Heart attack Maternal Grandfather     ALLERGIES:  is allergic to chlorhexidine; promethazine hcl; and diphenhydramine hcl.  MEDICATIONS:  Current Outpatient Prescriptions  Medication Sig Dispense Refill  . Cholecalciferol 2000 units CAPS Take 1 capsule by mouth daily.    . divalproex (DEPAKOTE) 250 MG DR tablet Take 1 tablet (250 mg total) by mouth at bedtime. 30 tablet 2  . doxycycline (VIBRA-TABS) 100 MG tablet Take 1 tablet (100 mg total) by mouth 2 (two) times daily. 14 tablet 7  . HYDROcodone-acetaminophen (NORCO) 5-325 MG tablet Take 1 tablet by mouth every 6 (six) hours as needed for moderate pain. 30 tablet 0  . letrozole (FEMARA) 2.5 MG tablet Take 1  tablet (2.5 mg total) by mouth daily. 30 tablet 2  . lidocaine-prilocaine (EMLA) cream Apply 1 application topically as needed. Apply to Porta-Cath 1-2 hours prior to access as directed. 30 g 1  . potassium chloride (K-DUR) 10 MEQ tablet Take 2 tablets (20 mEq total) by mouth daily. 60 tablet 1  . loperamide (IMODIUM A-D) 2 MG tablet Take 1-2 tablets after each loose stool. UP TO 6 IN  24 HRS. (Patient not taking: Reported on 01/16/2016) 30 tablet 1  . LORazepam (ATIVAN) 1 MG tablet Place one tablet under the tongue or swallow every 4-6 hours as needed for nausea. (THIS IS THE SAME STRENGTH AS 2 OF YOUR 0.5 MG TABLETS) (Patient not taking: Reported on 01/16/2016) 30 tablet 0  . ondansetron (ZOFRAN) 8 MG tablet Take 1 tablet (8 mg total) by mouth every 8 (eight) hours as needed for nausea or vomiting. (Patient not taking: Reported on 01/16/2016) 30 tablet 0   No current facility-administered medications for this visit.    Facility-Administered Medications Ordered in Other Visits  Medication Dose Route Frequency Provider Last Rate Last Dose  . sodium chloride 0.9 % 1,000 mL with potassium chloride 10 mEq infusion   Intravenous Continuous Gordy Levan, MD   Stopped at 09/10/15 1653  . sodium chloride 0.9 % injection 10 mL  10 mL Intravenous PRN Livesay, Lennis P, MD        REVIEW OF SYSTEMS:   Constitutional: Denies fevers, chills or abnormal night sweats Eyes: Denies blurriness of vision, double vision or watery eyes Ears, nose, mouth, throat, and face: Denies mucositis or sore throat (+) deaf Respiratory: Denies cough, dyspnea or wheezes Cardiovascular: Denies palpitation, chest discomfort or lower extremity swelling Gastrointestinal:  Denies nausea, heartburn or change in bowel habits Lymphatics: Denies new lymphadenopathy or easy bruising. Behavioral/Psych: (+) upset about restarting chemotherapy All other systems were reviewed with the patient and are negative.  PHYSICAL EXAMINATION: ECOG  PERFORMANCE STATUS: 1  Vitals:   05/22/16 1635  BP: 121/64  Pulse: 74  Resp: 18  Temp: 97.9 F (36.6 C)   Filed Weights   05/22/16 1635  Weight: 129 lb 9.6 oz (58.8 kg)   GENERAL:alert, no distress and  comfortable EYES: normal, conjunctiva are pink and non-injected, sclera clear OROPHARYNX:no exudate, no erythema and lips, buccal mucosa, and tongue normal  NECK: supple, thyroid normal size, non-tender, without nodularity LYMPH:  no palpable lymphadenopathy in the cervical, axillary or inguinal LUNGS: clear to auscultation and percussion with normal breathing effort HEART: regular rate & rhythm and no murmurs and no lower extremity edema ABDOMEN:abdomen soft, non-tender and normal bowel sounds Musculoskeletal:no cyanosis of digits and no clubbing  PSYCH: alert & oriented x 3 with fluent speech NEURO: no focal motor/sensory deficits Breasts: Breast inspection showed Right breast is surgically absent status post reconstruction. Palpation of the breasts and axilla revealed no obvious mass that I could appreciate.   LABORATORY DATA:  I have reviewed the data as listed CBC Latest Ref Rng & Units 05/14/2016 04/24/2016 04/03/2016  WBC 3.9 - 10.3 10e3/uL 3.9 5.3 4.9  Hemoglobin 11.6 - 15.9 g/dL 11.5(L) 11.3(L) 11.3(L)  Hematocrit 34.8 - 46.6 % 33.7(L) 33.3(L) 32.9(L)  Platelets 145 - 400 10e3/uL 192 216 229   CMP Latest Ref Rng & Units 05/14/2016 04/24/2016 04/03/2016  Glucose 70 - 140 mg/dl 90 94 102  BUN 7.0 - 26.0 mg/dL 9.0 7.8 6.0(L)  Creatinine 0.6 - 1.1 mg/dL 0.8 0.8 0.8  Sodium 136 - 145 mEq/L 141 140 141  Potassium 3.5 - 5.1 mEq/L 3.6 3.6 3.2(L)  Chloride 101 - 111 mmol/L - - -  CO2 22 - 29 mEq/L _0 Calcium 8.4 - 10.4 mg/dL 8.7 9.1 8.7  Total Protein 6.4 - 8.3 g/dL 6.5 6.7 6.6  Total Bilirubin 0.20 - 1.20 mg/dL 1.26(H) 1.25(H) 1.26(H)  Alkaline Phos 40 - 150 U/L 54 59 66  AST 5 - 34 U/L _1 ALT 0 - 55 U/L _2 PROCEDURES: Echocardiogram  01/27/16 Impressions: - Low normal GLS -15.9.  RADIOGRAPHIC STUDIES: I have personally reviewed the radiological images as listed and agreed with the findings in the report. Ct Chest W Contrast  Result Date: 05/01/2016 CLINICAL DATA:  Restaging breast cancer. EXAM: CT CHEST, ABDOMEN, AND PELVIS WITH CONTRAST TECHNIQUE: Multidetector CT imaging of the chest, abdomen and pelvis was performed following the standard protocol during bolus administration of intravenous contrast. CONTRAST:  155m ISOVUE-300 IOPAMIDOL (ISOVUE-300) INJECTION 61% COMPARISON:  PET-CT from 12/25/2015 FINDINGS: CT CHEST FINDINGS Cardiovascular: The heart size is mildly enlarged. No pericardial effusion. Mediastinum/Nodes: No enlarged mediastinal, hilar, or axillary lymph nodes. Similar appearance of low-attenuation nodule within right lobe of thyroid gland measures 1.5 cm. Lungs/Pleura: No pleural effusion identified. Stable left lower lobe nodule measuring 3 mm, image 4 series 4.No new or enlarging nodules or masses. Musculoskeletal: There are mixed lytic and sclerotic metastasis involving the posterior elements of T1 and the left first rib Status post right mastectomy CT ABDOMEN PELVIS FINDINGS Hepatobiliary: Low-attenuation structure within the medial segment of left lobe is unchanged measuring 6 mm, image 50 of series 2. Favor small cyst. Enhancing structure within posterior right lobe of liver measures 8 mm and is unchanged from previous exam, image 45 of series 2. Presumed focal fatty deposition within the left lobe of liver about the falciform ligament. Stable adjacent low-density structure within the lateral segment of left lobe measuring 6 mm. Previous cholecystectomy. No biliary dilatation. Pancreas: Unremarkable. No pancreatic ductal dilatation or surrounding inflammatory changes. Spleen: Normal in size without focal abnormality. Adrenals/Urinary Tract: Adrenal glands are unremarkable. Kidneys are normal, without renal calculi,  focal lesion, or hydronephrosis. Bladder is unremarkable. Stable hypodensity  within the anterior cortex of left kidney measuring 7 mm, image 60 of series 2. Urinary bladder appears normal. Stomach/Bowel: The stomach is unremarkable. The small bowel loops have a normal course and caliber. No obstruction. Unremarkable appearance of the colon Vascular/Lymphatic: No significant vascular findings are present. No enlarged abdominal or pelvic lymph nodes. Reproductive: Uterus and bilateral adnexa are unremarkable. Other: No abdominal wall hernia or abnormality. No abdominopelvic ascites. Musculoskeletal: Previous open reduction and internal fixation of left femur. Asymmetric sclerosis in the left iliac wing is identified corresponding to an area of treated metastases. Lucent lesion within the right iliac bone is unchanged measuring 9 mm. Small sclerotic metastasis within the right iliac bone is unchanged, image 90 of series 2. No evidence for new or progressive bone metastases. IMPRESSION: 1. Stable exam.  No new or progressive disease identified. 2. No evidence for residual or recurrent adenopathy within the chest. 3. Stable bone metastasis. Electronically Signed   By: Kerby Moors M.D.   On: 05/01/2016 09:51   Nm Bone Scan Whole Body  Result Date: 05/01/2016 CLINICAL DATA:  History of metastatic breast carcinoma. No current bone pain. EXAM: NUCLEAR MEDICINE WHOLE BODY BONE SCAN TECHNIQUE: Whole body anterior and posterior images were obtained approximately 3 hours after intravenous injection of radiopharmaceutical. RADIOPHARMACEUTICALS:  20.1 mCi Technetium-27mMDP IV COMPARISON:  PET-CT, 12/25/2015.  Bone scan, 07/01/2015. FINDINGS: There small focal areas of uptake which involves the left anterior second rib end in the right anterior fourth rib end, new since the prior bone scan. Uptake involving the left femur is similar to the prior study most likely reactive uptake related to the previous ORIF. There is a  small area of focal uptake along the posterior aspect of the cervical spine which was present previously, likely degenerative. There is also stable uptake involving the right shoulder, which is likely degenerative. Renal uptake is symmetric. IMPRESSION: 1. Small focal uptake involving the anterior rib ends of the left second and right fourth ribs, new since the prior bone scan. The location of this uptake is more suggestive of a traumatic or inflammatory etiology as opposed to metastatic disease. No other evidence of metastatic disease. 2. There are other areas of stable uptake which are most likely degenerative/reactive, including right shoulder and cervical spine uptake and uptake along the right femur adjacent to the ORIF hardware. Electronically Signed   By: DLajean ManesM.D.   On: 05/01/2016 14:44   Ct Abdomen Pelvis W Contrast  Result Date: 05/01/2016 CLINICAL DATA:  Restaging breast cancer. EXAM: CT CHEST, ABDOMEN, AND PELVIS WITH CONTRAST TECHNIQUE: Multidetector CT imaging of the chest, abdomen and pelvis was performed following the standard protocol during bolus administration of intravenous contrast. CONTRAST:  109mISOVUE-300 IOPAMIDOL (ISOVUE-300) INJECTION 61% COMPARISON:  PET-CT from 12/25/2015 FINDINGS: CT CHEST FINDINGS Cardiovascular: The heart size is mildly enlarged. No pericardial effusion. Mediastinum/Nodes: No enlarged mediastinal, hilar, or axillary lymph nodes. Similar appearance of low-attenuation nodule within right lobe of thyroid gland measures 1.5 cm. Lungs/Pleura: No pleural effusion identified. Stable left lower lobe nodule measuring 3 mm, image 4 series 4.No new or enlarging nodules or masses. Musculoskeletal: There are mixed lytic and sclerotic metastasis involving the posterior elements of T1 and the left first rib Status post right mastectomy CT ABDOMEN PELVIS FINDINGS Hepatobiliary: Low-attenuation structure within the medial segment of left lobe is unchanged measuring 6  mm, image 50 of series 2. Favor small cyst. Enhancing structure within posterior right lobe of liver measures 8 mm and  is unchanged from previous exam, image 45 of series 2. Presumed focal fatty deposition within the left lobe of liver about the falciform ligament. Stable adjacent low-density structure within the lateral segment of left lobe measuring 6 mm. Previous cholecystectomy. No biliary dilatation. Pancreas: Unremarkable. No pancreatic ductal dilatation or surrounding inflammatory changes. Spleen: Normal in size without focal abnormality. Adrenals/Urinary Tract: Adrenal glands are unremarkable. Kidneys are normal, without renal calculi, focal lesion, or hydronephrosis. Bladder is unremarkable. Stable hypodensity within the anterior cortex of left kidney measuring 7 mm, image 60 of series 2. Urinary bladder appears normal. Stomach/Bowel: The stomach is unremarkable. The small bowel loops have a normal course and caliber. No obstruction. Unremarkable appearance of the colon Vascular/Lymphatic: No significant vascular findings are present. No enlarged abdominal or pelvic lymph nodes. Reproductive: Uterus and bilateral adnexa are unremarkable. Other: No abdominal wall hernia or abnormality. No abdominopelvic ascites. Musculoskeletal: Previous open reduction and internal fixation of left femur. Asymmetric sclerosis in the left iliac wing is identified corresponding to an area of treated metastases. Lucent lesion within the right iliac bone is unchanged measuring 9 mm. Small sclerotic metastasis within the right iliac bone is unchanged, image 90 of series 2. No evidence for new or progressive bone metastases. IMPRESSION: 1. Stable exam.  No new or progressive disease identified. 2. No evidence for residual or recurrent adenopathy within the chest. 3. Stable bone metastasis. Electronically Signed   By: Kerby Moors M.D.   On: 05/01/2016 09:51   Nm Pet Image Restag (ps) Skull Base To Thigh  Result Date:  05/20/2016 CLINICAL DATA:  Subsequent treatment strategy for breast cancer. EXAM: NUCLEAR MEDICINE PET SKULL BASE TO THIGH TECHNIQUE: 6.4 mCi F-18 FDG was injected intravenously. Full-ring PET imaging was performed from the skull base to thigh after the radiotracer. CT data was obtained and used for attenuation correction and anatomic localization. FASTING BLOOD GLUCOSE:  Value: 91 mg/dl COMPARISON:  Bone scan and CT exams of 05/01/2016 and PET-CT of 12/25/2015. FINDINGS: NECK No hypermetabolic lymph nodes in the neck. Mild tonsillar activity is thought to be physiologic. CHEST No hypermetabolic mediastinal or hilar nodes. No suspicious pulmonary nodules on the CT data. Right breast reconstruction with tram flap. ABDOMEN/PELVIS No abnormal hypermetabolic activity within the liver, pancreas, adrenal glands, or spleen. No hypermetabolic lymph nodes in the abdomen or pelvis. Cholecystectomy. [facility] Compared to the prior PET-CT there are multiple new foci of osseous metastatic disease, including in the left T11 vertebral body (maximum SUV 4.6) and multiple foci in the bony pelvis (including a left iliac bone lesion with maximum SUV of 8.7 and a right iliac super acetabular lesion with maximum SUV 14.8. ). Other new bony pelvic metastatic lesions are present as well. Looking back at the bone scan,, there was a right anterior fourth rib fracture which demonstrates healing response and faint metabolic activity currently, and a left anterior second rib focus which demonstrates no fracture or abnormal metabolic activity currently. IMPRESSION: 1. There multiple metastatic foci in the bony pelvis, including some new abnormal foci compare to the prior PET-CT, compatible with mildly progressive bony metastatic disease. Also, a left T11 vertebral hypermetabolic metastatic lesion is observed, new compared to the prior exam. 2. No active extraosseous malignancy is currently identified. 3. Right anterior fourth rib healing  fracture, appears benign. Electronically Signed   By: Van Clines M.D.   On: 05/20/2016 14:12   ASSESSMENT & PLAN:  [name] is a 51 y.o. postmenopause female with:  1. 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 -Restaging PET 12-25-15 markedly improved of bone metastasis after initial systemic chemo  -Letrozole begun 01-16-16, continue herceptin and perjeta.  -Did not tolerate SQ xgeva and it was changed to zometa monthly, will change it to evey 6 weeks to match her infusion  -Echocardiogram 01-27-16 showed normal EF, will continue monitoring every 3 months when she is on herceptin -Echo due, she will schedule it asap  -I reviewed her CT scan and bone scan from 05/01/2016, which showed stable disease overall, a few new uptake of anterior ribs on bone scan are indeterminate. -She has developed worsening left hip pain, and more fatigued lately. I have clinical concern of disease progression. -I discussed her restaging PET scan from 05/20/2016. Unfortunately it showed mild disease progression of her bone metastasis  -I discussed treatment options. She previously had excellent response to chemotherapy, I strongly advised the patient to go back to chemotherapy to help, and I will change docetaxel to paclitaxel weekly, so she can tolerate it better.  However patient start crying, and decline chemotherapy due to her previous poor tolerance.  -We reviewed a second line treatment options, I recommend her to try T-DM1 which is well tolerated in general  --Chemotherapy consent: Side effects including but does not not limited to, fatigue, nausea, diarrhea, mild hair loss, neuropathy, fluid retention, renal and kidney dysfunction, neutropenic fever, needed for blood transfusion, bleeding, were discussed with patient in great detail. She agrees to  proceed. -The goal of therapy is palliative -Plan to start in 2 weeks  2. History of MRSA skin infection scalp, boil at low back scalp and upper lip -I prescribed doxycycline on 04/24/16 for 7 days  - Resolved now.  3.congenital deafness -needs sign language interpreter for all visits.  -Husband also deaf.  4. premature surgical menopause -post BSO 206fr pelvic pain and ovarian cysts, likely was also beneficial from standpoint of the breast cancer. -hysterectomy.   5. Intermittent anemia  -mild, possible related to prior chemo -She is asymptomatic, we'll continue close monitoring  6. Hypokalemia  -K 3.1 on 03/05/16, she is on KCL KCL 231m daily, increased to 4082mdaily for a week. -K 3.2 on 04/03/16. The patient is on KCl 18m26maily, increased to 40me34mily for a week and then take 1 pill a day as normal. - K 3.6, 05/14/16. -previously resolved continue KCL   7. Goal of care discussion  -We previously discussed the incurable nature of her cancer, and the overall poor prognosis, especially if she does not have good response to chemotherapy or progress on chemo.  -The patient understands the goal of care is palliative. -she is full code for now   8. Left Leg Pain -worsening left leg and hip pain, 6-8/10 in severity, this was much improved after her prior chemo - The patient previously took Hydrocodone for this pain, which helped. - I refilled Hydrocodone previously on 5/3. She will take 1 tablet every 6 hours as needed for pain. -Her pain is much improved   Plan:  - We reviewed her PET scan findings. Due to disease progression, we'll stop Herceptin and pejeta, and changed to T-DM1 - schedule treatment in 2 weeks with regular interpretor  - no medications refilled   All questions were answered. The patient knows to call the clinic with any problems, questions or concerns.  I spent 30 minutes  counseling the patient face to face. The total time spent in the appointment was  40 minutes and more than 50% was on counseling.  This document serves as a record of services personally performed by Truitt Merle, MD. It was created on her behalf by Brandt Loosen, a trained medical scribe. The creation of this record is based on the scribe's personal observations and the provider's statements to them. This document has been checked and approved by the attending provider.    Truitt Merle, MD 05/22/2016

## 2016-05-24 ENCOUNTER — Encounter: Payer: Self-pay | Admitting: Hematology

## 2016-05-24 NOTE — Progress Notes (Signed)
START ON PATHWAY REGIMEN - Breast     A cycle is every 21 days:     Ado-trastuzumab emtansine   **Always confirm dose/schedule in your pharmacy ordering system**    Patient Characteristics: Metastatic Chemotherapy, HER2 Positive, ER +, Second Line Therapeutic Status: Distant Metastases BRCA Mutation Status: Absent ER Status: Positive (+) HER2 Status: Positive (+) Would you be surprised if this patient died  in the next year? I would be surprised if this patient died in the next year PR Status: Negative (-) Line of therapy: Second Line  Intent of Therapy: Non-Curative / Palliative Intent, Discussed with Patient

## 2016-05-24 NOTE — Progress Notes (Signed)
Patient on plan of care prior to pathways. 

## 2016-05-25 ENCOUNTER — Telehealth: Payer: Self-pay | Admitting: Hematology

## 2016-05-25 NOTE — Telephone Encounter (Signed)
lvm to inform pt of cxled 5/17 apppts per sch msg

## 2016-05-28 ENCOUNTER — Ambulatory Visit: Payer: Medicaid Other | Admitting: Hematology

## 2016-06-04 ENCOUNTER — Other Ambulatory Visit (HOSPITAL_BASED_OUTPATIENT_CLINIC_OR_DEPARTMENT_OTHER): Payer: Medicaid Other

## 2016-06-04 ENCOUNTER — Other Ambulatory Visit: Payer: Self-pay | Admitting: *Deleted

## 2016-06-04 ENCOUNTER — Ambulatory Visit: Payer: Medicaid Other

## 2016-06-04 ENCOUNTER — Ambulatory Visit (HOSPITAL_BASED_OUTPATIENT_CLINIC_OR_DEPARTMENT_OTHER): Payer: Medicaid Other

## 2016-06-04 ENCOUNTER — Other Ambulatory Visit: Payer: Self-pay | Admitting: Hematology

## 2016-06-04 VITALS — BP 120/76 | HR 58 | Resp 16

## 2016-06-04 DIAGNOSIS — C50911 Malignant neoplasm of unspecified site of right female breast: Secondary | ICD-10-CM

## 2016-06-04 DIAGNOSIS — C7951 Secondary malignant neoplasm of bone: Secondary | ICD-10-CM

## 2016-06-04 DIAGNOSIS — C799 Secondary malignant neoplasm of unspecified site: Principal | ICD-10-CM

## 2016-06-04 DIAGNOSIS — C50919 Malignant neoplasm of unspecified site of unspecified female breast: Secondary | ICD-10-CM

## 2016-06-04 DIAGNOSIS — Z5112 Encounter for antineoplastic immunotherapy: Secondary | ICD-10-CM

## 2016-06-04 DIAGNOSIS — Z95828 Presence of other vascular implants and grafts: Secondary | ICD-10-CM

## 2016-06-04 LAB — COMPREHENSIVE METABOLIC PANEL
ALT: 10 U/L (ref 0–55)
AST: 11 U/L (ref 5–34)
Albumin: 3.5 g/dL (ref 3.5–5.0)
Alkaline Phosphatase: 57 U/L (ref 40–150)
Anion Gap: 6 mEq/L (ref 3–11)
BILIRUBIN TOTAL: 1.4 mg/dL — AB (ref 0.20–1.20)
BUN: 11.8 mg/dL (ref 7.0–26.0)
CO2: 27 meq/L (ref 22–29)
Calcium: 8.9 mg/dL (ref 8.4–10.4)
Chloride: 106 mEq/L (ref 98–109)
Creatinine: 0.8 mg/dL (ref 0.6–1.1)
EGFR: 90 mL/min/{1.73_m2} — AB (ref >90)
GLUCOSE: 93 mg/dL (ref 70–140)
Potassium: 3.5 mEq/L (ref 3.5–5.1)
SODIUM: 139 meq/L (ref 136–145)
TOTAL PROTEIN: 6.5 g/dL (ref 6.4–8.3)

## 2016-06-04 LAB — CBC WITH DIFFERENTIAL/PLATELET
BASO%: 0.2 % (ref 0.0–2.0)
Basophils Absolute: 0 10*3/uL (ref 0.0–0.1)
EOS%: 4 % (ref 0.0–7.0)
Eosinophils Absolute: 0.2 10*3/uL (ref 0.0–0.5)
HCT: 34.4 % — ABNORMAL LOW (ref 34.8–46.6)
HGB: 11.9 g/dL (ref 11.6–15.9)
LYMPH%: 34.4 % (ref 14.0–49.7)
MCH: 30.2 pg (ref 25.1–34.0)
MCHC: 34.6 g/dL (ref 31.5–36.0)
MCV: 87.3 fL (ref 79.5–101.0)
MONO#: 0.4 10*3/uL (ref 0.1–0.9)
MONO%: 7.1 % (ref 0.0–14.0)
NEUT%: 54.3 % (ref 38.4–76.8)
NEUTROS ABS: 2.7 10*3/uL (ref 1.5–6.5)
Platelets: 182 10*3/uL (ref 145–400)
RBC: 3.94 10*6/uL (ref 3.70–5.45)
RDW: 12.6 % (ref 11.2–14.5)
WBC: 4.9 10*3/uL (ref 3.9–10.3)
lymph#: 1.7 10*3/uL (ref 0.9–3.3)

## 2016-06-04 MED ORDER — SODIUM CHLORIDE 0.9% FLUSH
10.0000 mL | INTRAVENOUS | Status: DC | PRN
  Administered 2016-06-04: 10 mL
  Filled 2016-06-04: qty 10

## 2016-06-04 MED ORDER — SODIUM CHLORIDE 0.9% FLUSH
10.0000 mL | INTRAVENOUS | Status: DC | PRN
  Administered 2016-06-04: 10 mL via INTRAVENOUS
  Filled 2016-06-04: qty 10

## 2016-06-04 MED ORDER — DIVALPROEX SODIUM 250 MG PO DR TAB: 250.0000 mg | DELAYED_RELEASE_TABLET | Freq: Every day | ORAL | 2 refills | Status: DC

## 2016-06-04 MED ORDER — HEPARIN SOD (PORK) LOCK FLUSH 100 UNIT/ML IV SOLN
500.0000 [IU] | Freq: Once | INTRAVENOUS | Status: AC | PRN
  Administered 2016-06-04: 500 [IU]
  Filled 2016-06-04: qty 5

## 2016-06-04 MED ORDER — SODIUM CHLORIDE 0.9 % IV SOLN
Freq: Once | INTRAVENOUS | Status: AC
  Administered 2016-06-04: 11:00:00 via INTRAVENOUS

## 2016-06-04 MED ORDER — ACETAMINOPHEN 325 MG PO TABS
650.0000 mg | ORAL_TABLET | Freq: Once | ORAL | Status: AC
  Administered 2016-06-04: 650 mg via ORAL

## 2016-06-04 MED ORDER — SODIUM CHLORIDE 0.9 % IV SOLN
3.5000 mg/kg | Freq: Once | INTRAVENOUS | Status: AC
  Administered 2016-06-04: 200 mg via INTRAVENOUS
  Filled 2016-06-04: qty 10

## 2016-06-04 MED ORDER — DIPHENHYDRAMINE HCL 25 MG PO CAPS: 50.0000 mg | ORAL_CAPSULE | Freq: Once | ORAL | Status: DC

## 2016-06-04 MED ORDER — LIDOCAINE-PRILOCAINE 2.5-2.5 % EX CREA: TOPICAL_CREAM | CUTANEOUS | 3 refills | Status: DC

## 2016-06-04 MED ORDER — ACETAMINOPHEN 325 MG PO TABS
ORAL_TABLET | ORAL | Status: AC
  Filled 2016-06-04: qty 2

## 2016-06-04 NOTE — Patient Instructions (Signed)
[name] Discharge Instructions for Patients Receiving Chemotherapy  Today you received the following chemotherapy agents ado-trastuzumab emtansine (Kadcyla)  To help prevent nausea and vomiting after your treatment, we encourage you to take your nausea medication as directed by your doctor.    If you develop nausea and vomiting that is not controlled by your nausea medication, call the clinic.   BELOW ARE SYMPTOMS THAT SHOULD BE REPORTED IMMEDIATELY:  *FEVER GREATER THAN 100.5 F  *CHILLS WITH OR WITHOUT FEVER  NAUSEA AND VOMITING THAT IS NOT CONTROLLED WITH YOUR NAUSEA MEDICATION  *UNUSUAL SHORTNESS OF BREATH  *UNUSUAL BRUISING OR BLEEDING  TENDERNESS IN MOUTH AND THROAT WITH OR WITHOUT PRESENCE OF ULCERS  *URINARY PROBLEMS  *BOWEL PROBLEMS  UNUSUAL RASH Items with * indicate a potential emergency and should be followed up as soon as possible.  Feel free to call the clinic you have any questions or concerns. The clinic phone number is [phone].  Please show the [facility] at check-in to the Emergency Department and triage nurse.    Ado-Trastuzumab Emtansine for injection What is this medicine? ADO-TRASTUZUMAB EMTANSINE (ADD oh traz TOO zuh mab em TAN zine) is a monoclonal antibody combined with chemotherapy. It is used to treat breast cancer. This medicine may be used for other purposes; ask your health care provider or pharmacist if you have questions. COMMON BRAND NAME(S): Kadcyla What should I tell my health care provider before I take this medicine? They need to know if you have any of these conditions: -heart disease -heart failure -infection (especially a virus infection such as chickenpox, cold sores, or herpes) -liver disease -lung or breathing disease, like asthma -an unusual or allergic reaction to ado-trastuzumab emtansine, other medications, foods, dyes, or preservatives -pregnant or trying to get  pregnant -breast-feeding How should I use this medicine? This medicine is for infusion into a vein. It is given by a health care professional in a hospital or clinic setting. Talk to your pediatrician regarding the use of this medicine in children. Special care may be needed. Overdosage: If you think you have taken too much of this medicine contact a poison control center or emergency room at once. NOTE: This medicine is only for you. Do not share this medicine with others. What if I miss a dose? It is important not to miss your dose. Call your doctor or health care professional if you are unable to keep an appointment. What may interact with this medicine? This medicine may also interact with the following medications: -atazanavir -boceprevir -clarithromycin -delavirdine -indinavir -dalfopristin; quinupristin -isoniazid, INH -itraconazole -ketoconazole -nefazodone -nelfinavir -ritonavir -telaprevir -telithromycin -tipranavir -voriconazole This list may not describe all possible interactions. Give your health care provider a list of all the medicines, herbs, non-prescription drugs, or dietary supplements you use. Also tell them if you smoke, drink alcohol, or use illegal drugs. Some items may interact with your medicine. What should I watch for while using this medicine? Visit your doctor for checks on your progress. This drug may make you feel generally unwell. This is not uncommon, as chemotherapy can affect healthy cells as well as cancer cells. Report any side effects. Continue your course of treatment even though you feel ill unless your doctor tells you to stop. You may need blood work done while you are taking this medicine. Call your doctor or health care professional for advice if you get a fever, chills or sore throat, or other symptoms of a cold or flu. Do not  treat yourself. This drug decreases your body's ability to fight infections. Try to avoid being around people who  are sick. Be careful brushing and flossing your teeth or using a toothpick because you may get an infection or bleed more easily. If you have any dental work done, tell your dentist you are receiving this medicine. Avoid taking products that contain aspirin, acetaminophen, ibuprofen, naproxen, or ketoprofen unless instructed by your doctor. These medicines may hide a fever. Do not become pregnant while taking this medicine or for 7 months after stopping it, men with female partners should use contraception during treatment and for 4 months after the last dose. Women should inform their doctor if they wish to become pregnant or think they might be pregnant. There is a potential for serious side effects to an unborn child. Do not breast-feed an infant while taking this medicine or for 7 months after the last dose. Men who have a partner who is pregnant or who is capable of becoming pregnant should use a condom during sexual activity while taking this medicine and for 4 months after stopping it. Men should inform their doctors if they wish to father a child. This medicine may lower sperm counts. Talk to your health care professional or pharmacist for more information. What side effects may I notice from receiving this medicine? Side effects that you should report to your doctor or health care professional as soon as possible: -allergic reactions like skin rash, itching or hives, swelling of the face, lips, or tongue -breathing problems -chest pain or palpitations -fever or chills, sore throat -general ill feeling or flu-like symptoms -light-colored stools -nausea, vomiting -pain, tingling, numbness in the hands or feet -signs and symptoms of bleeding such as bloody or black, tarry stools; red or dark-brown urine; spitting up blood or brown material that looks like coffee grounds; red spots on the skin; unusual bruising or bleeding from the eye, gums, or nose -swelling of the legs or ankles -yellowing  of the eyes or skin Side effects that usually do not require medical attention (report to your doctor or health care professional if they continue or are bothersome): -changes in taste -constipation -dizziness -headache -joint pain -muscle pain -trouble sleeping -unusually weak or tired This list may not describe all possible side effects. Call your doctor for medical advice about side effects. You may report side effects to FDA at 1-800-FDA-1088. Where should I keep my medicine? This drug is given in a hospital or clinic and will not be stored at home. NOTE: This sheet is a summary. It may not cover all possible information. If you have questions about this medicine, talk to your doctor, pharmacist, or health care provider.  2018 [employer] Standard (2015-02-18 12:11:06)

## 2016-06-04 NOTE — Patient Instructions (Signed)
Implanted Port Home Guide An implanted port is a type of central line that is placed under the skin. Central lines are used to provide IV access when treatment or nutrition needs to be given through a person's veins. Implanted ports are used for long-term IV access. An implanted port may be placed because:  You need IV medicine that would be irritating to the small veins in your hands or arms.  You need long-term IV medicines, such as antibiotics.  You need IV nutrition for a long period.  You need frequent blood draws for lab tests.  You need dialysis.  Implanted ports are usually placed in the chest area, but they can also be placed in the upper arm, the abdomen, or the leg. An implanted port has two main parts:  Reservoir. The reservoir is round and will appear as a small, raised area under your skin. The reservoir is the part where a needle is inserted to give medicines or draw blood.  Catheter. The catheter is a thin, flexible tube that extends from the reservoir. The catheter is placed into a large vein. Medicine that is inserted into the reservoir goes into the catheter and then into the vein.  How will I care for my incision site? Do not get the incision site wet. Bathe or shower as directed by your health care provider. How is my port accessed? Special steps must be taken to access the port:  Before the port is accessed, a numbing cream can be placed on the skin. This helps numb the skin over the port site.  Your health care provider uses a sterile technique to access the port. ? Your health care provider must put on a mask and sterile gloves. ? The skin over your port is cleaned carefully with an antiseptic and allowed to dry. ? The port is gently pinched between sterile gloves, and a needle is inserted into the port.  Only "non-coring" port needles should be used to access the port. Once the port is accessed, a blood return should be checked. This helps ensure that the port  is in the vein and is not clogged.  If your port needs to remain accessed for a constant infusion, a clear (transparent) bandage will be placed over the needle site. The bandage and needle will need to be changed every week, or as directed by your health care provider.  Keep the bandage covering the needle clean and dry. Do not get it wet. Follow your health care provider's instructions on how to take a shower or bath while the port is accessed.  If your port does not need to stay accessed, no bandage is needed over the port.  What is flushing? Flushing helps keep the port from getting clogged. Follow your health care provider's instructions on how and when to flush the port. Ports are usually flushed with saline solution or a medicine called heparin. The need for flushing will depend on how the port is used.  If the port is used for intermittent medicines or blood draws, the port will need to be flushed: ? After medicines have been given. ? After blood has been drawn. ? As part of routine maintenance.  If a constant infusion is running, the port may not need to be flushed.  How long will my port stay implanted? The port can stay in for as long as your health care provider thinks it is needed. When it is time for the port to come out, surgery will be   done to remove it. The procedure is similar to the one performed when the port was put in. When should I seek immediate medical care? When you have an implanted port, you should seek immediate medical care if:  You notice a bad smell coming from the incision site.  You have swelling, redness, or drainage at the incision site.  You have more swelling or pain at the port site or the surrounding area.  You have a fever that is not controlled with medicine.  This information is not intended to replace advice given to you by your health care provider. Make sure you discuss any questions you have with your health care provider. Document  Released: 12/29/2004 Document Revised: 06/06/2015 Document Reviewed: 09/05/2012 Elsevier Interactive Patient Education  2017 [employer]  

## 2016-06-05 ENCOUNTER — Telehealth: Payer: Self-pay | Admitting: *Deleted

## 2016-06-05 LAB — CANCER ANTIGEN 27.29: CAN 27.29: 51.8 U/mL — AB (ref 0.0–38.6)

## 2016-06-05 NOTE — Telephone Encounter (Signed)
Called & received vm via deaf interpreter &  left message for pt to call back with any problems/concerns regarding treatment.

## 2016-06-05 NOTE — Telephone Encounter (Signed)
-----   Message from Johann Capers, RN sent at 06/04/2016 11:27 AM EDT ----- Regarding: Dr. Burr Medico - f/u phone call to pt Dr. Burr Medico - f/u phone call for first-time [facility]

## 2016-06-23 NOTE — Progress Notes (Signed)
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 She had mastectomy with the 7 axillary node evaluation, 4 cycles of adriamycin/ cytoxan followed by taxotere, then five years of tamoxifen thru June 2005 (no herceptin in 1999). She had bilateral oophorectomy in May 2005. She was briefly on aromatase inhibitor after tamoxifen, but discontinued this herself due to poor tolerance.      04/04/2015 Genetic Testing    Negative genetic testing on the breast/ovarian cancer pnael.  The Breast/Ovarian gene panel offered by [facility] includes sequencing and rearrangement analysis for the following 20 genes:  ATM, BARD1, BRCA1, BRCA2, BRIP1, CDH1, CHEK2, EPCAM, FANCC, MLH1, MSH2, MSH6, NBN, PALB2, PMS2, PTEN, RAD51C, RAD51D, TP53, and XRCC2.      06/26/2015 Pathology Results    Initial Path Report Bone, curettage, Left femur met, intramedullary subtroch tissue - METASTATIC ADENOCARCINOMA.  Estrogen Receptor: 95%, POSITIVE, STRONG STAINING INTENSITY Progesterone Receptor: 2%, POSITIVE, STRONG  HER2 (+), IHC 3+      06/26/2015 Surgery    Biopsy of metastatic tissue and stabilization with Affixus trochanteric nail, proximal and distal interlock of left femur by Dr. [name]       06/28/2015 Imaging    CT Chest w/ Contrast 1. Extensive right internal mammary chain lymphadenopathy consistent with metastatic breast cancer. 2. Lytic metastases involving the posterior elements at T1 with probable nondisplaced pathologic fracture of the spinous process. 3. No  other evidence of thoracic metastatic disease. 4. Trace bilateral pleural effusions with associated bibasilar atelectasis. 5. Indeterminate right thyroid nodule, not previously imaged. This could be evaluated with thyroid ultrasound as clinically warranted.      07/01/2015 Imaging    Bone Scan 1. Increased activity noted throughout the left femur. Although metastatic disease cannot be excluded, these changes are most likely secondary to prior surgery. 2. No other focal abnormalities identified to suggest metastatic disease.      07/12/2015 - 07/26/2015 Radiation Therapy    Left femur, 30 Gy in 10 fractions by Dr. Sondra Come       07/14/2015 Initial Diagnosis    [name], right ([place])     07/19/2015 Imaging    Initial PET Scan 1. Severe multifocal osseous metastatic disease, much greater than anticipated based on prior imaging. 2. Multifocal nodal metastases involving the right internal mammary, prevascular and subpectoral lymph nodes. No axillary pulmonary involvement identified. 3. No distant extra osseous metastases.      07/30/2015 - 11/29/2015 Chemotherapy    Docetaxel, Herceptin and Perjeta every 3 weeks X6 cycles, pt tolerated moderately well, restaging scan showed excellent response       09/18/2015 Imaging    CT Head w/wo Contrast 1. No acute intracranial abnormality or significant interval change. 2. Stable minimal periventricular white matter hypoattenuation on the right. This may reflect a remote ischemic injury. 3. No evidence for metastatic disease to the brain. 4. No focal soft tissue lesion to explain the patient's tenderness.      10/30/2015 Imaging    CT Abdomen Pelvis w/ Contrast  1. Few mildly prominent fluid-filled loops of small bowel scattered throughout the abdomen, with intraluminal fluid density within the distal colon. Given the provided history, findings are suggestive of acute enteritis/diarrheal illness. 2. No other acute  intra-abdominal or pelvic process identified. 3. Widespread osseous metastatic disease, better evaluated on most recent PET-CT from 07/19/2015. No pathologic fracture or other complication. 4. 11 mm hypodensity within the left kidney. This lesion measures intermediate density, and is indeterminate. While this lesion is similar in size relative to recent studies, this is increased in size relative to prior study from 2012. Further evaluation with dedicated renal mass protocol CT and/or MRI is recommended for complete characterization.      12/25/2015 Imaging    Restaging PET Scan 1. Markedly improved skeletal activity, with only several faint foci of residual accentuated metabolic activity, but resolution of the vast majority of the previously extensive osseous metastatic disease. 2. Resolution of the prior right internal mammary and right prevascular lymph nodes. 3. Residual subcutaneous edema and edema along fascia planes in the left upper thigh. This remains somewhat more than I would expect for placement of an IM nail 6 weeks ago. If there is leg swelling further down, consider Doppler venous ultrasound to rule out left lower extremity DVT. I do not perceive an obvious difference in density between the pelvic and common femoral veins on the noncontrast CT data. 4. Chronic right maxillary and right sphenoid sinusitis.      12/2015 -  Chemotherapy    Maintenance Herceptin and pejeta every 3 weeks      01/16/2016 -  Anti-estrogen oral therapy    Letrozole 2.5 mg daily      03/01/2016 Miscellaneous    Patient presented to ED following a fall; she reports she landed on her back and is now experiencing back pain. The patient was evaluated and discharge home same day with pain control.      05/01/2016 Imaging    CT CAP IMPRESSION: 1. Stable exam.  No new or progressive disease identified. 2. No evidence for residual or recurrent adenopathy within the chest. 3. Stable bone metastasis.       05/01/2016 Imaging    BONE SCAN IMPRESSION: 1. Small focal uptake involving the anterior rib ends of the left second and right fourth ribs, new since the prior bone scan. The location of this uptake is more suggestive of a traumatic or inflammatory etiology as opposed to metastatic disease. No other evidence of metastatic disease. 2. There are other areas of stable uptake which are most likely degenerative/reactive, including right shoulder and cervical spine uptake and uptake along the right femur adjacent to the ORIF hardware.      05/20/2016 Imaging    NM PET Skull to Thigh IMPRESSION: 1. There multiple metastatic foci in the bony pelvis, including some new abnormal foci compare to the prior PET-CT, compatible with mildly progressive bony metastatic disease. Also, a left T11 vertebral hypermetabolic metastatic lesion is observed, new compared to the prior exam. 2. No active extraosseous malignancy is currently identified. 3. Right anterior fourth rib healing fracture, appears be       HISTORY OF PRESENTING ILLNESS (From Dr. Mariana Kaufman note on 02/06/2016):  [name] 51 y.o. 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 She has had an excellent response to 6 cycles of taxotere herceptin perjeta from 07-30-15 thru 11-29-15. She is continuing herceptin and perjeta every 3 weeks.  She had extreme local pain with first xgeva injection on 01-16-16 and prefers resuming previous zometa. She began letrozole ~ 01-16-16, has no complaints about this at all.  Last imaging was PET 12-25-15.  NOTE CTs, plain Xrays and bone scan did NOT show any of the extensively metastatic bone involvement 06-2015; MRI and PET did image disease clearly.  Patient continues to do very well overall. MRSA skin lesions on scalp and face improved quickly with doxycycline starting 01-16-16. She continues to use Dial soap and  we have again strongly encouraged good hygiene with other family members at home. She has had the 3 lymphedema PT sessions allowed by insurance, continues to do massage for LLE herself, still has not gotten the ordered compression apparatus for home and will follow up with PT on that. Swelling in LLE has improved with the interventions and does not seem as uncomfortable. She denies any pain. Appetite is excellent, bowels fine, no SOB, good energy, no problems with PAC, no noted changes in left breast. No bleeding. No fever or symptoms of infection. She slipped in tub last week, fell onto right arm fortunately without injury. She was upset by a visitor and shaky then, but no tremors otherwise.   She has two children and 4 grandchildren. She has had genetic testing and it was negative. She lives at home with her husband. She is very active. Her children live close to her.   CURRENT THERAPY:  Letrozole 2.34m daily, second line Kadcyla every 3 weeks started on 06/04/16  INTERIM HISTORY: She returns today for follow up she is accompanied by her husband and interpreter. She has experienced nausea and a cough. She did not try any anti-nasuea medication. She reprorts Zofran did not help. She also has pain in her left leg when she walks or lays on her left side. Her appetite has improved. She also experiences insomnia. Her pain medication causes constipation, she uses a stool softener to help.    MEDICAL HISTORY:  Past Medical History:  Diagnosis Date  . Abnormal Pap smear   . Anxiety   . Breast cancer ([facility]   . Cancer ([place]   breast-s/p mastectomy, chemo, rad  . CIN I (cervical intraepithelial neoplasia I) 2003   by colpo  . Congenital deafness   . Deaf   . Depression   . Radiation 07/12/15-07/26/15   left femur 30 Gy  . S/P bilateral oophorectomy     SURGICAL HISTORY: Past Surgical History:  Procedure Laterality Date  . ABDOMINAL HYSTERECTOMY    . INTRAMEDULLARY (IM) NAIL  INTERTROCHANTERIC Left 06/26/2015   Procedure: LEFT BIOMET LONG AFFIXS NAIL;  Surgeon: MMarybelle Killings MD;  [facility]  Service: Orthopedics;  Laterality: Left;  . IR GENERIC HISTORICAL  08/06/2015   IR [place] V[place] ACC[place] LEFT 08/06/2015 GAletta Edouard MD [facility]-INTERV RAD  . IR GENERIC HISTORICAL  08/06/2015   IR FLUORO GUIDE CV LINE LEFT 08/06/2015 GAletta Edouard MD [facility]  . IR GENERIC HISTORICAL  03/05/2016   IR CV LINE INJECTION 03/05/2016 AMarkus Daft MD [facility]INTER[facility]  . MASTECTOMY     right breast  . MASTECTOMY    . OVARIAN CYST REMOVAL    . RADIOLOGY WITH ANESTHESIA Left 06/25/2015   Procedure: MRI OF LEFT HIP WITH OR WITHOUT CONTRAST;  Surgeon: Medication Radiologist, MD;  [place] [facility]  Service: Radiology;  Laterality: Left;  DR. MCINTYRE/MRI  . TUBAL LIGATION      SOCIAL HISTORY: Social History  Social History  . Marital status: Married    Spouse name: N/A  . Number of children: 2  . Years of education: N/A   Occupational History  . Not on file.   Social History Main Topics  . Smoking status: Never Smoker  . Smokeless tobacco: Never Used  . Alcohol use No  . Drug use: No  . Sexual activity: Yes    Birth control/ protection: Surgical   Other Topics Concern  . Not on file   Social History Narrative  . No narrative on file    FAMILY HISTORY: Family History  Problem Relation Age of Onset  . Hypertension Mother   . Seizures Mother   . Alzheimer's disease Maternal Uncle   . Pancreatitis Maternal Grandmother   . Heart attack Maternal Grandfather     ALLERGIES:  is allergic to chlorhexidine; promethazine hcl; and diphenhydramine hcl.  MEDICATIONS:  Current Outpatient Prescriptions  Medication Sig Dispense Refill  . Cholecalciferol 2000 units CAPS Take 1 capsule by mouth daily.    . divalproex (DEPAKOTE) 250 MG DR tablet Take 1 tablet (250 mg total) by mouth at bedtime. 30 tablet 2  . doxycycline (VIBRA-TABS) 100 MG tablet Take 1 tablet (100 mg  total) by mouth 2 (two) times daily. 14 tablet 7  . HYDROcodone-acetaminophen (NORCO) 5-325 MG tablet Take 1 tablet by mouth every 6 (six) hours as needed for moderate pain. 30 tablet 0  . letrozole (FEMARA) 2.5 MG tablet Take 1 tablet (2.5 mg total) by mouth daily. 30 tablet 2  . lidocaine-prilocaine (EMLA) cream Apply 1 application topically as needed. Apply to Porta-Cath 1-2 hours prior to access as directed. 30 g 1  . lidocaine-prilocaine (EMLA) cream Apply to affected area once 30 g 3  . loperamide (IMODIUM A-D) 2 MG tablet Take 1-2 tablets after each loose stool. UP TO 6 IN  24 HRS. (Patient not taking: Reported on 01/16/2016) 30 tablet 1  . LORazepam (ATIVAN) 1 MG tablet Place one tablet under the tongue or swallow every 4-6 hours as needed for nausea. (THIS IS THE SAME STRENGTH AS 2 OF YOUR 0.5 MG TABLETS) (Patient not taking: Reported on 01/16/2016) 30 tablet 0  . ondansetron (ZOFRAN) 8 MG tablet Take 1 tablet (8 mg total) by mouth every 8 (eight) hours as needed for nausea or vomiting. (Patient not taking: Reported on 01/16/2016) 30 tablet 0  . potassium chloride (K-DUR) 10 MEQ tablet Take 2 tablets (20 mEq total) by mouth daily. 60 tablet 1  . prochlorperazine (COMPAZINE) 10 MG tablet Take 1 tablet (10 mg total) by mouth every 6 (six) hours as needed for nausea or vomiting. 30 tablet 2   No current facility-administered medications for this visit.    Facility-Administered Medications Ordered in Other Visits  Medication Dose Route Frequency Provider Last Rate Last Dose  . 0.9 %  sodium chloride infusion   Intravenous Once Truitt Merle, MD      . acetaminophen (TYLENOL) tablet 650 mg  650 mg Oral Once Truitt Merle, MD      . ado-trastuzumab emtansine [facility]) 220 mg in sodium chloride 0.9 % 250 mL chemo infusion  3.6 mg/kg (Treatment Plan Recorded) Intravenous Once Truitt Merle, MD      . heparin lock flush 100 unit/mL  500 Units Intracatheter Once PRN Truitt Merle, MD      . sodium chloride 0.9 % 1,000  mL with potassium chloride 10 mEq infusion   Intravenous Continuous Livesay, Tamala Julian, MD   Stopped  at 09/10/15 1653  . sodium chloride 0.9 % injection 10 mL  10 mL Intravenous PRN Livesay, Lennis P, MD      . sodium chloride flush (NS) 0.9 % injection 10 mL  10 mL Intracatheter PRN Truitt Merle, MD        REVIEW OF SYSTEMS:  Constitutional: Denies fevers, chills or abnormal night sweats (+)nausea (+)insominia  Eyes: Denies blurriness of vision, double vision or watery eyes Ears, nose, mouth, throat, and face: Denies mucositis or sore throat (+) deaf Respiratory: Denies cough, dyspnea or wheezes Cardiovascular: Denies palpitation, chest discomfort or lower extremity swelling Gastrointestinal:  Denies nausea, heartburn or change in bowel habits Lymphatics: Denies new lymphadenopathy or easy bruising. Behavioral/Psych: (+)mild anxiety All other systems were reviewed with the patient and are negative.  PHYSICAL EXAMINATION:  ECOG PERFORMANCE STATUS: 1 Blood pressure 119/71, heart rate 69, respiratory rate 20, temperature 36.6, pulse ox 99% on room year GENERAL:alert, no distress and comfortable EYES: normal, conjunctiva are pink and non-injected, sclera clear OROPHARYNX:no exudate, no erythema and lips, buccal mucosa, and tongue normal  NECK: supple, thyroid normal size, non-tender, without nodularity LYMPH:  no palpable lymphadenopathy in the cervical, axillary or inguinal LUNGS: clear to auscultation and percussion with normal breathing effort HEART: regular rate & rhythm and no murmurs and no lower extremity edema ABDOMEN:abdomen soft, non-tender and normal bowel sounds Musculoskeletal:no cyanosis of digits and no clubbing  PSYCH: alert & oriented x 3 with fluent speech NEURO: no focal motor/sensory deficits Breasts: Breast inspection showed Right breast is surgically absent status post reconstruction. Palpation of the breasts and axilla revealed no obvious mass that I could  appreciate.   LABORATORY DATA:  I have reviewed the data as listed CBC Latest Ref Rng & Units 06/26/2016 06/04/2016 05/14/2016  WBC 3.9 - 10.3 10e3/uL 5.0 4.9 3.9  Hemoglobin 11.6 - 15.9 g/dL 12.2 11.9 11.5(L)  Hematocrit 34.8 - 46.6 % 35.5 34.4(L) 33.7(L)  Platelets 145 - 400 10e3/uL 236 182 192   CMP Latest Ref Rng & Units 06/26/2016 06/04/2016 05/14/2016  Glucose 70 - 140 mg/dl 122 93 90  BUN 7.0 - 26.0 mg/dL 10.7 11.8 9.0  Creatinine 0.6 - 1.1 mg/dL 0.9 0.8 0.8  Sodium 136 - 145 mEq/L 143 139 141  Potassium 3.5 - 5.1 mEq/L 3.3(L) 3.5 3.6  Chloride 101 - 111 mmol/L - - -  CO2 22 - 29 mEq/L 25 27 27   Calcium 8.4 - 10.4 mg/dL 9.2 8.9 8.7  Total Protein 6.4 - 8.3 g/dL 7.0 6.5 6.5  Total Bilirubin 0.20 - 1.20 mg/dL 1.39(H) 1.40(H) 1.26(H)  Alkaline Phos 40 - 150 U/L 58 57 54  AST 5 - 34 U/L 15 11 9   ALT 0 - 55 U/L 15 10 9    PROCEDURES: Echocardiogram 05/21/2016 Study Conclusions  - Left ventricle: GLS is normal at -20.6% The cavity size was   normal. There was mild focal basal hypertrophy of the septum.   Systolic function was normal. The estimated ejection fraction was   55%. There is akinesis of the basalinferior myocardium. - Aortic valve: Trileaflet; normal thickness, mildly calcified   leaflets. There was trivial regurgitation. - Mitral valve: There was trivial regurgitation. - Tricuspid valve: There was mild regurgitation. - Pulmonic valve: There was trivial regurgitation. - Pulmonary arteries: PA peak pressure: 37 mm Hg (S).  Impressions:  - The right ventricular systolic pressure was increased consistent   with mild pulmonary hypertension.   RADIOGRAPHIC STUDIES: PET 05/20/2016 IMPRESSION: 1. There multiple metastatic  foci in the bony pelvis, including some new abnormal foci compare to the prior PET-CT, compatible with mildly progressive bony metastatic disease. Also, a left T11 vertebral hypermetabolic metastatic lesion is observed, new compared to the prior  exam. 2. No active extraosseous malignancy is currently identified. 3. Right anterior fourth rib healing fracture, appears benign.  NM Bone Scan 05/01/2016 IMPRESSION: 1. Small focal uptake involving the anterior rib ends of the left second and right fourth ribs, new since the prior bone scan. The location of this uptake is more suggestive of a traumatic or inflammatory etiology as opposed to metastatic disease. No other evidence of metastatic disease. 2. There are other areas of stable uptake which are most likely degenerative/reactive, including right shoulder and cervical spine uptake and uptake along the right femur adjacent to the ORIF Hardware.  CT CAP 05/01/2016 IMPRESSION: 1. Stable exam.  No new or progressive disease identified. 2. No evidence for residual or recurrent adenopathy within the chest. 3. Stable bone metastasis.   I have personally reviewed the radiological images as listed and agreed with the findings in the report. No results found. ASSESSMENT & PLAN:  Mrs. Nevers is a 51 y.o. postmenopause female with:  1. 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 -Restaging PET 12-25-15 markedly improved of bone metastasis after initial systemic chemo  -Letrozole begun 01-16-16, continue herceptin and perjeta.  -Did not tolerate SQ xgeva and it was changed to zometa monthly, will change it to evey 6 weeks to match her infusion  -Echocardiogram 01-27-16 showed normal EF, will continue monitoring every 3 months when she is on herceptin -I reviewed her CT scan and bone scan from 05/01/2016, which showed stable disease overall, a few new uptake of anterior ribs on bone scan are indeterminate. -She has developed worsening left hip pain, and more fatigued lately. I have clinical concern of disease progression. -I previously  discussed  her restaging PET scan from 05/20/2016. Unfortunately it showed mild disease progression of her bone metastasis  -We previously reviewed a second line treatment options, I recommend her to try T-DM1 which is well tolerated in general  -The goal of therapy is palliative - Labs reviewed. She is adequate for treatment today.  - I will refill Compazine today. She can take it up to 3 times a day. - I reviewed PET scan findings in detail.  -Discussed safe sex during chemo. I strongly encouraged her to use condoms during intercourse. She may wait 1 week after chemo if she does not want to use one. - Due to pelvic bone being frail, bone fracture can happen - We will continue treatment as long as it's working.  - repeat PET scan after 4 treatments of Kadcyla   2. History of MRSA skin infection scalp, boil at low back scalp and upper lip -I prescribed doxycycline on 04/24/16 for 7 days  - Resolved now.  3.congenital deafness -needs sign language interpreter for all visits.  -Husband also deaf.  4. premature surgical menopause -post BSO 2012fr pelvic pain and ovarian cysts, likely was also beneficial from standpoint of the breast cancer. -hysterectomy.   5. Intermittent anemia  -mild, possible related to prior chemo -She is asymptomatic, we'll continue close monitoring  6. Hypokalemia  -K 3.1 on 03/05/16, she is on KCL KCL 235m daily, increased to 4059mdaily for a week. -K 3.2 on 04/03/16. The  patient is on KCl 23mq daily, increased to 464m daily for a week and then take 1 pill a day as normal. -K 3.3 today, continue KCL 2068mdaily   7. Goal of care discussion  -We previously discussed the incurable nature of her cancer, and the overall poor prognosis, especially if she does not have good response to chemotherapy or progress on chemo.  -The patient understands the goal of care is palliative. -she is full code for now   8. Left Leg Pain -worsening left leg and hip pain, 6-8/10 in  severity, this was much improved after her prior chemo - The patient previously took Hydrocodone for this pain, which helped. - I refilled Hydrocodone previously on 5/3. She will take 1 tablet every 6 hours as needed for pain. -Pain has reccurred. Recent PET scan showed minimum uptake, unlikely related to bone mets progression  - I encouraged her to call Dr. YatLorin Mercyout the pain in her left leg. I will also copy my notes to him.  9. Insomnia - I will prescribe Ambien today  (6/15) - I encouraged her to try compazine first  Plan:   - I will refill compazine today. She can use up to 3 times a day. I will also prescribe Ambien - repeat PET scan after 4 treatments - schedule treatment in 2 weeks with regular interpretor  - treatment & f/u July 5 - copy notes to Dr.Yates   Pt had many questions. All questions were answered. The patient knows to call the clinic with any problems, questions or concerns.  I spent 30 minutes counseling the patient face to face. The total time spent in the appointment was 40 minutes and more than 50% was on counseling.  This document serves as a record of services personally performed by YanTruitt MerleD. It was created on her behalf by TalBrandt Loosen trained medical scribe. The creation of this record is based on the scribe's personal observations and the provider's statements to them. This document has been checked and approved by the attending provider.    FenTruitt MerleD 06/26/2016

## 2016-06-25 ENCOUNTER — Other Ambulatory Visit: Payer: Medicaid Other

## 2016-06-25 ENCOUNTER — Ambulatory Visit: Payer: Medicaid Other

## 2016-06-25 ENCOUNTER — Ambulatory Visit: Payer: Medicaid Other | Admitting: Hematology

## 2016-06-26 ENCOUNTER — Ambulatory Visit (HOSPITAL_BASED_OUTPATIENT_CLINIC_OR_DEPARTMENT_OTHER): Payer: Medicaid Other | Admitting: Hematology

## 2016-06-26 ENCOUNTER — Ambulatory Visit (HOSPITAL_BASED_OUTPATIENT_CLINIC_OR_DEPARTMENT_OTHER): Payer: Medicaid Other

## 2016-06-26 ENCOUNTER — Ambulatory Visit: Payer: Medicaid Other

## 2016-06-26 ENCOUNTER — Other Ambulatory Visit (HOSPITAL_BASED_OUTPATIENT_CLINIC_OR_DEPARTMENT_OTHER): Payer: Medicaid Other

## 2016-06-26 DIAGNOSIS — Z5112 Encounter for antineoplastic immunotherapy: Secondary | ICD-10-CM

## 2016-06-26 DIAGNOSIS — E876 Hypokalemia: Secondary | ICD-10-CM

## 2016-06-26 DIAGNOSIS — C50919 Malignant neoplasm of unspecified site of unspecified female breast: Secondary | ICD-10-CM

## 2016-06-26 DIAGNOSIS — H9193 Unspecified hearing loss, bilateral: Secondary | ICD-10-CM | POA: Diagnosis not present

## 2016-06-26 DIAGNOSIS — L02821 Furuncle of head [any part, except face]: Secondary | ICD-10-CM

## 2016-06-26 DIAGNOSIS — K122 Cellulitis and abscess of mouth: Secondary | ICD-10-CM | POA: Diagnosis not present

## 2016-06-26 DIAGNOSIS — C7951 Secondary malignant neoplasm of bone: Secondary | ICD-10-CM

## 2016-06-26 DIAGNOSIS — C50911 Malignant neoplasm of unspecified site of right female breast: Secondary | ICD-10-CM | POA: Diagnosis not present

## 2016-06-26 DIAGNOSIS — D649 Anemia, unspecified: Secondary | ICD-10-CM | POA: Diagnosis not present

## 2016-06-26 DIAGNOSIS — C799 Secondary malignant neoplasm of unspecified site: Principal | ICD-10-CM

## 2016-06-26 DIAGNOSIS — Z95828 Presence of other vascular implants and grafts: Secondary | ICD-10-CM

## 2016-06-26 DIAGNOSIS — Z170 Estrogen receptor positive status [ER+]: Secondary | ICD-10-CM

## 2016-06-26 DIAGNOSIS — E28319 Asymptomatic premature menopause: Secondary | ICD-10-CM

## 2016-06-26 LAB — CBC WITH DIFFERENTIAL/PLATELET
BASO%: 0.6 % (ref 0.0–2.0)
Basophils Absolute: 0 10*3/uL (ref 0.0–0.1)
EOS%: 3.3 % (ref 0.0–7.0)
Eosinophils Absolute: 0.2 10*3/uL (ref 0.0–0.5)
HCT: 35.5 % (ref 34.8–46.6)
HEMOGLOBIN: 12.2 g/dL (ref 11.6–15.9)
LYMPH%: 32.5 % (ref 14.0–49.7)
MCH: 29.9 pg (ref 25.1–34.0)
MCHC: 34.4 g/dL (ref 31.5–36.0)
MCV: 86.9 fL (ref 79.5–101.0)
MONO#: 0.3 10*3/uL (ref 0.1–0.9)
MONO%: 6.8 % (ref 0.0–14.0)
NEUT%: 56.8 % (ref 38.4–76.8)
NEUTROS ABS: 2.8 10*3/uL (ref 1.5–6.5)
PLATELETS: 236 10*3/uL (ref 145–400)
RBC: 4.08 10*6/uL (ref 3.70–5.45)
RDW: 13.1 % (ref 11.2–14.5)
WBC: 5 10*3/uL (ref 3.9–10.3)
lymph#: 1.6 10*3/uL (ref 0.9–3.3)

## 2016-06-26 LAB — COMPREHENSIVE METABOLIC PANEL
ALK PHOS: 58 U/L (ref 40–150)
ALT: 15 U/L (ref 0–55)
ANION GAP: 11 meq/L (ref 3–11)
AST: 15 U/L (ref 5–34)
Albumin: 3.6 g/dL (ref 3.5–5.0)
BILIRUBIN TOTAL: 1.39 mg/dL — AB (ref 0.20–1.20)
BUN: 10.7 mg/dL (ref 7.0–26.0)
CO2: 25 mEq/L (ref 22–29)
Calcium: 9.2 mg/dL (ref 8.4–10.4)
Chloride: 107 mEq/L (ref 98–109)
Creatinine: 0.9 mg/dL (ref 0.6–1.1)
EGFR: 76 mL/min/{1.73_m2} — AB (ref >90)
Glucose: 122 mg/dl (ref 70–140)
Potassium: 3.3 mEq/L — ABNORMAL LOW (ref 3.5–5.1)
Sodium: 143 mEq/L (ref 136–145)
TOTAL PROTEIN: 7 g/dL (ref 6.4–8.3)

## 2016-06-26 MED ORDER — SODIUM CHLORIDE 0.9% FLUSH
10.0000 mL | INTRAVENOUS | Status: DC | PRN
  Administered 2016-06-26: 10 mL
  Filled 2016-06-26: qty 10

## 2016-06-26 MED ORDER — SODIUM CHLORIDE 0.9 % IV SOLN
Freq: Once | INTRAVENOUS | Status: AC
  Administered 2016-06-26: 12:00:00 via INTRAVENOUS

## 2016-06-26 MED ORDER — SODIUM CHLORIDE 0.9 % IV SOLN
3.5000 mg/kg | Freq: Once | INTRAVENOUS | Status: AC
  Administered 2016-06-26: 200 mg via INTRAVENOUS
  Filled 2016-06-26: qty 10

## 2016-06-26 MED ORDER — PROCHLORPERAZINE MALEATE 10 MG PO TABS: 10.0000 mg | ORAL_TABLET | Freq: Four times a day (QID) | ORAL | 2 refills | Status: DC | PRN

## 2016-06-26 MED ORDER — ACETAMINOPHEN 325 MG PO TABS
ORAL_TABLET | ORAL | Status: AC
  Filled 2016-06-26: qty 2

## 2016-06-26 MED ORDER — HEPARIN SOD (PORK) LOCK FLUSH 100 UNIT/ML IV SOLN
500.0000 [IU] | Freq: Once | INTRAVENOUS | Status: AC | PRN
Start: 2016-06-26 — End: 2016-06-26
  Administered 2016-06-26: 500 [IU]
  Filled 2016-06-26: qty 5

## 2016-06-26 MED ORDER — ACETAMINOPHEN 325 MG PO TABS
650.0000 mg | ORAL_TABLET | Freq: Once | ORAL | Status: AC
  Administered 2016-06-26: 650 mg via ORAL

## 2016-06-26 MED ORDER — ZOLPIDEM TARTRATE 5 MG PO TABS: 5.0000 mg | ORAL_TABLET | Freq: Every evening | ORAL | 1 refills | Status: DC | PRN

## 2016-06-26 MED ORDER — ZOLEDRONIC ACID 4 MG/100ML IV SOLN
4.0000 mg | Freq: Once | INTRAVENOUS | Status: AC
  Administered 2016-06-26: 4 mg via INTRAVENOUS
  Filled 2016-06-26: qty 100

## 2016-06-26 NOTE — Patient Instructions (Signed)
[facility] Discharge Instructions for Patients Receiving Chemotherapy  Today you received the following chemotherapy agents kadcyla  To help prevent nausea and vomiting after your treatment, we encourage you to take your nausea medication as ordered per md   If you develop nausea and vomiting that is not controlled by your nausea medication, call the clinic.   BELOW ARE SYMPTOMS THAT SHOULD BE REPORTED IMMEDIATELY:  *FEVER GREATER THAN 100.5 F  *CHILLS WITH OR WITHOUT FEVER  NAUSEA AND VOMITING THAT IS NOT CONTROLLED WITH YOUR NAUSEA MEDICATION  *UNUSUAL SHORTNESS OF BREATH  *UNUSUAL BRUISING OR BLEEDING  TENDERNESS IN MOUTH AND THROAT WITH OR WITHOUT PRESENCE OF ULCERS  *URINARY PROBLEMS  *BOWEL PROBLEMS  UNUSUAL RASH Items with * indicate a potential emergency and should be followed up as soon as possible.  Feel free to call the clinic you have any questions or concerns. The clinic phone number is [phone].  Please show the [place] at check-in to the Emergency Department and triage nurse.

## 2016-06-26 NOTE — Progress Notes (Signed)
Interpreter with patient throughout stay at [facility].

## 2016-06-28 ENCOUNTER — Encounter: Payer: Self-pay | Admitting: Hematology

## 2016-07-03 ENCOUNTER — Telehealth: Payer: Self-pay | Admitting: Hematology

## 2016-07-03 NOTE — Telephone Encounter (Signed)
No los per 06/26/16 visit.

## 2016-07-09 NOTE — Progress Notes (Signed)
Pt has appt July 10

## 2016-07-13 ENCOUNTER — Telehealth: Payer: Self-pay

## 2016-07-13 MED ORDER — LETROZOLE 2.5 MG PO TABS: 2.5000 mg | ORAL_TABLET | Freq: Every day | ORAL | 2 refills | Status: DC

## 2016-07-13 NOTE — Telephone Encounter (Signed)
Pt called for letrozole refill. Done.

## 2016-07-14 NOTE — Progress Notes (Signed)
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 She had mastectomy with the 7 axillary node evaluation, 4 cycles of adriamycin/ cytoxan followed by taxotere, then five years of tamoxifen thru June 2005 (no herceptin in 1999). She had bilateral oophorectomy in May 2005. She was briefly on aromatase inhibitor after tamoxifen, but discontinued this herself due to poor tolerance.      04/04/2015 Genetic Testing    Negative genetic testing on the breast/ovarian cancer pnael.  The Breast/Ovarian gene panel offered by [facility] includes sequencing and rearrangement analysis for the following 20 genes:  ATM, BARD1, BRCA1, BRCA2, BRIP1, CDH1, CHEK2, EPCAM, FANCC, MLH1, MSH2, MSH6, NBN, PALB2, PMS2, PTEN, RAD51C, RAD51D, TP53, and XRCC2.      06/26/2015 Pathology Results    Initial Path Report Bone, curettage, Left femur met, intramedullary subtroch tissue - METASTATIC ADENOCARCINOMA.  Estrogen Receptor: 95%, POSITIVE, STRONG STAINING INTENSITY Progesterone Receptor: 2%, POSITIVE, STRONG  HER2 (+), IHC 3+      06/26/2015 Surgery    Biopsy of metastatic tissue and stabilization with Affixus trochanteric nail, proximal and distal interlock of left femur by Dr. Lorin [name]       06/28/2015 Imaging    CT Chest w/ Contrast 1. Extensive right internal mammary chain lymphadenopathy consistent with metastatic breast cancer. 2. Lytic metastases involving the posterior elements at T1 with probable  nondisplaced pathologic fracture of the spinous process. 3. No other evidence of thoracic metastatic disease. 4. Trace bilateral pleural effusions with associated bibasilar atelectasis. 5. Indeterminate right thyroid nodule, not previously imaged. This could be evaluated with thyroid ultrasound as clinically warranted.      07/01/2015 Imaging    Bone Scan 1. Increased activity noted throughout the left femur. Although metastatic disease cannot be excluded, these changes are most likely secondary to prior surgery. 2. No other focal abnormalities identified to suggest metastatic disease.      07/12/2015 - 07/26/2015 Radiation Therapy    Left femur, 30 Gy in 10 fractions by Dr. Sondra [name]       07/14/2015 Initial Diagnosis    [name], right ([place])     07/19/2015 Imaging    Initial PET Scan 1. Severe multifocal osseous metastatic disease, much greater than anticipated based on prior imaging. 2. Multifocal nodal metastases involving the right internal mammary, prevascular and subpectoral lymph nodes. No axillary pulmonary involvement identified. 3. No distant extra osseous metastases.      07/30/2015 - 11/29/2015 Chemotherapy    Docetaxel, Herceptin and Perjeta every 3 weeks X6 cycles, pt tolerated moderately well, restaging scan showed excellent response       09/18/2015 Imaging    CT Head w/wo Contrast 1. No acute intracranial abnormality or significant interval change. 2. Stable minimal periventricular white matter hypoattenuation on the right. This may reflect a remote ischemic injury. 3. No evidence for metastatic disease to the brain. 4. No focal soft tissue lesion to explain the patient's tenderness.      10/30/2015  Imaging    CT Abdomen Pelvis w/ Contrast 1. Few mildly prominent fluid-filled loops of small bowel scattered throughout the abdomen, with intraluminal fluid density within the distal colon. Given the provided history, findings are suggestive of  acute enteritis/diarrheal illness. 2. No other acute intra-abdominal or pelvic process identified. 3. Widespread osseous metastatic disease, better evaluated on most recent PET-CT from 07/19/2015. No pathologic fracture or other complication. 4. 11 mm hypodensity within the left kidney. This lesion measures intermediate density, and is indeterminate. While this lesion is similar in size relative to recent studies, this is increased in size relative to prior study from 2012. Further evaluation with dedicated renal mass protocol CT and/or MRI is recommended for complete characterization.      12/25/2015 Imaging    Restaging PET Scan 1. Markedly improved skeletal activity, with only several faint foci of residual accentuated metabolic activity, but resolution of the vast majority of the previously extensive osseous metastatic disease. 2. Resolution of the prior right internal mammary and right prevascular lymph nodes. 3. Residual subcutaneous edema and edema along fascia planes in the left upper thigh. This remains somewhat more than I would expect for placement of an IM nail 6 weeks ago. If there is leg swelling further down, consider Doppler venous ultrasound to rule out left lower extremity DVT. I do not perceive an obvious difference in density between the pelvic and common femoral veins on the noncontrast CT data. 4. Chronic right maxillary and right sphenoid sinusitis.      12/2015 -  Chemotherapy    Maintenance Herceptin and pejeta every 3 weeks      01/16/2016 -  Anti-estrogen oral therapy    Letrozole 2.5 mg daily      03/01/2016 Miscellaneous    Patient presented to ED following a fall; she reports she landed on her back and is now experiencing back pain. The patient was evaluated and discharge home same day with pain control.      05/01/2016 Imaging    CT CAP IMPRESSION: 1. Stable exam.  No new or progressive disease identified. 2. No evidence for residual or recurrent adenopathy  within the chest. 3. Stable bone metastasis.      05/01/2016 Imaging    BONE SCAN IMPRESSION: 1. Small focal uptake involving the anterior rib ends of the left second and right fourth ribs, new since the prior bone scan. The location of this uptake is more suggestive of a traumatic or inflammatory etiology as opposed to metastatic disease. No other evidence of metastatic disease. 2. There are other areas of stable uptake which are most likely degenerative/reactive, including right shoulder and cervical spine uptake and uptake along the right femur adjacent to the ORIF hardware.      05/20/2016 Imaging    NM PET Skull to Thigh IMPRESSION: 1. There multiple metastatic foci in the bony pelvis, including some new abnormal foci compare to the prior PET-CT, compatible with mildly progressive bony metastatic disease. Also, a left T11 vertebral hypermetabolic metastatic lesion is observed, new compared to the prior exam. 2. No active extraosseous malignancy is currently identified. 3. Right anterior fourth rib healing fracture, appears be       HISTORY OF PRESENTING ILLNESS (From Dr. Mariana Kaufman note on 02/06/2016):  [name] 51 y.o. 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 She has had an excellent response to 6 cycles of taxotere herceptin perjeta from 07-30-15 thru 11-29-15.  She is continuing herceptin and perjeta every 3 weeks. She had extreme local pain with first xgeva injection on 01-16-16 and prefers resuming previous zometa. She began letrozole ~ 01-16-16, has no complaints about this at all.  Last imaging was PET 12-25-15.  NOTE CTs, plain Xrays and bone scan did NOT show any of the extensively metastatic bone involvement 06-2015; MRI and PET did image disease clearly.  Patient continues to do very well overall. MRSA skin lesions on scalp and face improved quickly with  doxycycline starting 01-16-16. She continues to use Dial soap and we have again strongly encouraged good hygiene with other family members at home. She has had the 3 lymphedema PT sessions allowed by insurance, continues to do massage for LLE herself, still has not gotten the ordered compression apparatus for home and will follow up with PT on that. Swelling in LLE has improved with the interventions and does not seem as uncomfortable. She denies any pain. Appetite is excellent, bowels fine, no SOB, good energy, no problems with PAC, no noted changes in left breast. No bleeding. No fever or symptoms of infection. She slipped in tub last week, fell onto right arm fortunately without injury. She was upset by a visitor and shaky then, but no tremors otherwise.   She has two children and 4 grandchildren. She has had genetic testing and it was negative. She lives at home with her husband. She is very active. Her children live close to her.   CURRENT THERAPY:  Letrozole 2.56m daily, second line Kadcyla every 3 weeks started on 06/04/16  INTERIM HISTORY: She returns today for follow up and ongoing treatment she is accompanied by her husband and interpreter. Her jaw is swollen and hurts to touch. She got a bee sting on her foot and from her foot to her lower leg she has loss of feeling. Her hip hurts occasionally. Ambien is not working for her and she still can not sleep at night. She has no other concerns.  [place] [place] and her husband will move in April 2019.   MEDICAL HISTORY:  Past Medical History:  Diagnosis Date  . Abnormal Pap smear   . Anxiety   . [place]   [place]   breast-s/p mastectomy, chemo, rad  . CIN I (cervical intraepithelial neoplasia I) 2003   by colpo  . Congenital deafness   . Deaf   . Depression   . Radiation 07/12/15-07/26/15   left femur 30 Gy  . S/P bilateral oophorectomy     SURGICAL HISTORY: Past Surgical History:    Procedure Laterality Date  . ABDOMINAL HYSTERECTOMY    . INTRAMEDULLARY (IM) NAIL INTERTROCHANTERIC Left 06/26/2015   Procedure: LEFT BIOMET LONG AFFIXS NAIL;  Surgeon: MMarybelle Killings MD;  [facility]  Service: Orthopedics;  Laterality: Left;  . IR GENERIC HISTORICAL  08/06/2015   IR [place] ACC[place] LEFT 08/06/2015 GAletta Edouard MD [facility]  . IR GENERIC HISTORICAL  08/06/2015   IR FLUORO GUIDE CV LINE LEFT 08/06/2015 GAletta Edouard MD [facility]  . IR GENERIC HISTORICAL  03/05/2016   IR CV LINE INJECTION 03/05/2016 AMarkus Daft MD [facility]  . MASTECTOMY     right breast  . MASTECTOMY    . OVARIAN CYST REMOVAL    . RADIOLOGY WITH ANESTHESIA Left 06/25/2015   Procedure: MRI OF LEFT HIP WITH OR WITHOUT CONTRAST;  Surgeon: Medication Radiologist, MD;  [facility];  Service: Radiology;  Laterality: Left;  DR. MCINTYRE/MRI  . TUBAL LIGATION      SOCIAL HISTORY: Social History   Social History  . Marital status: Married    Spouse name: N/A  . Number of children: 2  . Years of education: N/A   Occupational History  . Not on file.   Social History Main Topics  . Smoking status: Never Smoker  . Smokeless tobacco: Never Used  . Alcohol use No  . Drug use: No  . Sexual activity: Yes    Birth control/ protection: Surgical   Other Topics Concern  . Not on file   Social History Narrative  . No narrative on file    FAMILY HISTORY: Family History  Problem Relation Age of Onset  . Hypertension Mother   . Seizures Mother   . Alzheimer's disease Maternal Uncle   . Pancreatitis Maternal Grandmother   . Heart attack Maternal Grandfather     ALLERGIES:  is allergic to chlorhexidine; promethazine hcl; and diphenhydramine hcl.  MEDICATIONS:  Current Outpatient Prescriptions  Medication Sig Dispense Refill  . amoxicillin-clavulanate (AUGMENTIN) 875-125 MG tablet Take 1 tablet by mouth 2 (two) times daily. 14 tablet 0  . Cholecalciferol 2000 units CAPS Take 1  capsule by mouth daily.    . divalproex (DEPAKOTE) 250 MG DR tablet Take 1 tablet (250 mg total) by mouth at bedtime. 30 tablet 2  . doxycycline (VIBRA-TABS) 100 MG tablet Take 1 tablet (100 mg total) by mouth 2 (two) times daily. 14 tablet 7  . HYDROcodone-acetaminophen (NORCO) 5-325 MG tablet Take 1 tablet by mouth every 6 (six) hours as needed for moderate pain. 30 tablet 0  . letrozole (FEMARA) 2.5 MG tablet Take 1 tablet (2.5 mg total) by mouth daily. 30 tablet 2  . lidocaine-prilocaine (EMLA) cream Apply 1 application topically as needed. Apply to Porta-Cath 1-2 hours prior to access as directed. 30 g 1  . lidocaine-prilocaine (EMLA) cream Apply to affected area once 30 g 3  . loperamide (IMODIUM A-D) 2 MG tablet Take 1-2 tablets after each loose stool. UP TO 6 IN  24 HRS. (Patient not taking: Reported on 01/16/2016) 30 tablet 1  . LORazepam (ATIVAN) 1 MG tablet Place one tablet under the tongue or swallow every 4-6 hours as needed for nausea. (THIS IS THE SAME STRENGTH AS 2 OF YOUR 0.5 MG TABLETS) (Patient not taking: Reported on 01/16/2016) 30 tablet 0  . LORazepam (ATIVAN) 1 MG tablet Take 1 tablet (1 mg total) by mouth at bedtime. 20 tablet 0  . ondansetron (ZOFRAN) 8 MG tablet Take 1 tablet (8 mg total) by mouth every 8 (eight) hours as needed for nausea or vomiting. (Patient not taking: Reported on 01/16/2016) 30 tablet 0  . potassium chloride (K-DUR) 10 MEQ tablet Take 2 tablets (20 mEq total) by mouth daily. 60 tablet 1  . prochlorperazine (COMPAZINE) 10 MG tablet Take 1 tablet (10 mg total) by mouth every 6 (six) hours as needed for nausea or vomiting. 30 tablet 2  . zolpidem (AMBIEN) 5 MG tablet Take 1 tablet (5 mg total) by mouth at bedtime as needed for sleep. 30 tablet 1   No current facility-administered medications for this visit.    Facility-Administered Medications Ordered in Other Visits  Medication Dose Route Frequency Provider Last Rate Last Dose  . sodium chloride 0.9 % 1,000  mL with potassium chloride 10 mEq infusion   Intravenous Continuous Livesay, Tamala Julian, MD   Stopped at  09/10/15 1653  . sodium chloride 0.9 % injection 10 mL  10 mL Intravenous PRN Livesay, Lennis P, MD        REVIEW OF SYSTEMS:  Constitutional: Denies fevers, chills or abnormal night sweats (+)nausea (+)insominia  Eyes: Denies blurriness of vision, double vision or watery eyes Ears, nose, mouth, throat, and face: Denies mucositis or sore throat (+) deaf Respiratory: Denies cough, dyspnea or wheezes Cardiovascular: Denies palpitation, chest discomfort or lower extremity swelling Gastrointestinal:  Denies nausea, heartburn or change in bowel habits Lymphatics: Denies new lymphadenopathy or easy bruising. Behavioral/Psych: (+)mild anxiety All other systems were reviewed with the patient and are negative.  PHYSICAL EXAMINATION: ECOG PERFORMANCE STATUS: 1 BP 136/67 (BP Location: Left Arm, Patient Position: Sitting)   Pulse (!) 57   Temp 97.7 F (36.5 C) (Oral)   Resp 20   Ht 5' 7"  (1.702 m)   Wt 129 lb 12.8 oz (58.9 kg)   SpO2 100%   BMI 20.33 kg/m  GENERAL:alert, no distress and comfortable (+)swelling and tenderness to  Left jaw (+)cavities EYES: normal, conjunctiva are pink and non-injected, sclera clear OROPHARYNX:no exudate, no erythema and lips, buccal mucosa, and tongue normal  NECK: supple, thyroid normal size, non-tender, without nodularity LYMPH:  no palpable lymphadenopathy in the cervical, axillary or inguinal LUNGS: clear to auscultation and percussion with normal breathing effort HEART: regular rate & rhythm and no murmurs and no lower extremity edema ABDOMEN:abdomen soft, non-tender and normal bowel sounds Musculoskeletal:no cyanosis of digits and no clubbing  PSYCH: alert & oriented x 3 with fluent speech NEURO: no focal motor/sensory deficits Breasts: Breast inspection showed Right breast is surgically absent status post reconstruction. Palpation of the breasts and  axilla revealed no obvious mass that I could appreciate.   LABORATORY DATA:  I have reviewed the data as listed CBC Latest Ref Rng & Units 07/16/2016 06/26/2016 06/04/2016  WBC 3.9 - 10.3 10e3/uL 5.1 5.0 4.9  Hemoglobin 11.6 - 15.9 g/dL 11.5(L) 12.2 11.9  Hematocrit 34.8 - 46.6 % 33.8(L) 35.5 34.4(L)  Platelets 145 - 400 10e3/uL 213 236 182   CMP Latest Ref Rng & Units 07/16/2016 06/26/2016 06/04/2016  Glucose 70 - 140 mg/dl 91 122 93  BUN 7.0 - 26.0 mg/dL 10.9 10.7 11.8  Creatinine 0.6 - 1.1 mg/dL 0.8 0.9 0.8  Sodium 136 - 145 mEq/L 138 143 139  Potassium 3.5 - 5.1 mEq/L 3.6 3.3(L) 3.5  Chloride 101 - 111 mmol/L - - -  CO2 22 - 29 mEq/L 25 25 27   Calcium 8.4 - 10.4 mg/dL 9.4 9.2 8.9  Total Protein 6.4 - 8.3 g/dL 7.0 7.0 6.5  Total Bilirubin 0.20 - 1.20 mg/dL 1.55(H) 1.39(H) 1.40(H)  Alkaline Phos 40 - 150 U/L 59 58 57  AST 5 - 34 U/L 16 15 11   ALT 0 - 55 U/L 15 15 10    PROCEDURES: Echocardiogram 05/21/2016 Study Conclusions  - Left ventricle: GLS is normal at -20.6% The cavity size was   normal. There was mild focal basal hypertrophy of the septum.   Systolic function was normal. The estimated ejection fraction was   55%. There is akinesis of the basalinferior myocardium. - Aortic valve: Trileaflet; normal thickness, mildly calcified   leaflets. There was trivial regurgitation. - Mitral valve: There was trivial regurgitation. - Tricuspid valve: There was mild regurgitation. - Pulmonic valve: There was trivial regurgitation. - Pulmonary arteries: PA peak pressure: 37 mm Hg (S).  Impressions:  - The right ventricular systolic pressure was increased  consistent   with mild pulmonary hypertension.   RADIOGRAPHIC STUDIES: PET 05/20/2016 IMPRESSION: 1. There multiple metastatic foci in the bony pelvis, including some new abnormal foci compare to the prior PET-CT, compatible with mildly progressive bony metastatic disease. Also, a left T11 vertebral hypermetabolic metastatic  lesion is observed, new compared to the prior exam. 2. No active extraosseous malignancy is currently identified. 3. Right anterior fourth rib healing fracture, appears benign.  NM Bone Scan 05/01/2016 IMPRESSION: 1. Small focal uptake involving the anterior rib ends of the left second and right fourth ribs, new since the prior bone scan. The location of this uptake is more suggestive of a traumatic or inflammatory etiology as opposed to metastatic disease. No other evidence of metastatic disease. 2. There are other areas of stable uptake which are most likely degenerative/reactive, including right shoulder and cervical spine uptake and uptake along the right femur adjacent to the ORIF Hardware.  CT CAP 05/01/2016 IMPRESSION: 1. Stable exam.  No new or progressive disease identified. 2. No evidence for residual or recurrent adenopathy within the chest. 3. Stable bone metastasis.   I have personally reviewed the radiological images as listed and agreed with the findings in the report. No results found. ASSESSMENT & PLAN:  [name] is a 51 y.o. postmenopause female with:  1. 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 -Restaging PET 12-25-15 markedly improved of bone metastasis after initial systemic chemo  -she was switched to letrozole and maintenance Herceptin and the pejeta on 01-16-16.  -Did not tolerate SQ xgeva and it was changed to zometa monthly, will change it to evey 6 weeks to match her infusion  -Echocardiogram 01-27-16 showed normal EF, will continue monitoring every 3 months when she is on herceptin -I reviewed her CT scan and bone scan from 05/01/2016, which showed stable disease overall, a few new uptake of anterior ribs on bone scan are indeterminate. -She has developed worsening left hip pain, and more fatigued lately. I  have clinical concern of disease progression. -I previously  discussed her restaging PET scan from 05/20/2016. Unfortunately it showed mild disease progression of her bone metastasis  -I have changed her treatment to second line Kadcyla, she is tolerating very well. She declined other chemo.  -will repeat PET scan after 4 treatments of Kadcyla  -Lab review, adequate for treatment, we'll proceed to cycle 3 treatment today  - f/u and treatment in 3 weeks  2. History of MRSA skin infection scalp, boil at low back scalp and upper lip -I prescribed doxycycline on 04/24/16 for 7 days  - Resolved now.  3.congenital deafness -needs sign language interpreter for all visits.  -Husband also deaf.  4. premature surgical menopause -post BSO 2013fr pelvic pain and ovarian cysts, likely was also beneficial from standpoint of the breast cancer. -hysterectomy.   5. Intermittent anemia  -mild, possible related to prior chemo -She is asymptomatic, we'll continue close monitoring  6. Hypokalemia  -K 3.1 on 03/05/16, she is on KCL KCL 253m daily, increased to 403mdaily for a week. -K 3.2 on 04/03/16. The patient is on KCl 82m39maily, increased to 40me67mily for a week and then take 1 pill a day as normal. -K 3.3 today, continue KCL 82meq90mly   7. Goal of care discussion  -We previously discussed the incurable nature of her cancer, and the overall poor  prognosis, especially if she does not have good response to chemotherapy or progress on chemo.  -The patient understands the goal of care is palliative. -she is full code for now   8. Left Leg Pain -she initially presented with worsening left leg and hip pain secondary to bone mets, she had surgery before, this was much improved after her prior chemo - The patient previously took Hydrocodone for this pain, which helped. - I refilled Hydrocodone previously on 5/3. She will take 1 tablet every 6 hours as needed for pain. -Pain has reccurred. Recent  PET scan showed minimum uptake, unlikely related to bone mets progression  - I encouraged her to call Dr. Lorin [name] about the pain in her left leg. She has a follow-up appointment next week   9. Left gum/jaw edema  - She has some cavities. This is likely gum infection  -I will prescribe Augmentin. I strongly encouraged her to find a dentist.   10. Insomnia -I give her Ambien 41m, but it did not work well.  - I will prescribe Adivan with 20 pills, 1 mg to take at night as needed   11. Relocation -[place] best [place], however her husband will not be able to move until April 2019 -We discussed the logistics, especially her medical care. She has Medicaid insurance now, [place]oc[place], however it may take a while and she may not be able to get medical treatment during that period of time. -We discussed her current treatment is every 3 weeks, I strongly discouraged her to into the treatment due to the location and new Insurance application. -I have spoken with our social worker GLavella Hammock she will talk to patient.  12. Goal of care discussion  -We discussed the incurable nature of her cancer, and the overall poor prognosis, especially if she does not have good response to chemotherapy or progress on chemo -The patient understands the goal of care is palliative. -I recommend DNR/DNI, she will think about it    Plan:   -Lab review, adequate for treatment, we'll proceed to cycle 3 Kadcyla today  - repeat scan after cycle 4 - prescribe Augmentin for 7 days and Adivan with 20 pills, 19mas needed for insomnia  - follow up and treatment in 3 weeks with regular interpretor  -Social worker referral to discuss [place] and [place]    Pt had many questions. All questions were answered. The patient knows to call the clinic with any problems, questions or  concerns.  I spent 30 minutes counseling the patient face to face. The total time spent in the appointment was 40 minutes and more than 50% was on counseling.  This document serves as a record of services personally performed by YaTruitt MerleMD. It was created on her behalf by TaBrandt Loosena trained medical scribe. The creation of this record is based on the scribe's personal observations and the provider's statements to them. This document has been checked and approved by the attending provider.    FeTruitt MerleMD 07/16/2016

## 2016-07-16 ENCOUNTER — Other Ambulatory Visit (HOSPITAL_BASED_OUTPATIENT_CLINIC_OR_DEPARTMENT_OTHER): Payer: Medicaid Other

## 2016-07-16 ENCOUNTER — Other Ambulatory Visit: Payer: Medicaid Other

## 2016-07-16 ENCOUNTER — Ambulatory Visit (HOSPITAL_BASED_OUTPATIENT_CLINIC_OR_DEPARTMENT_OTHER): Payer: Medicaid Other

## 2016-07-16 ENCOUNTER — Ambulatory Visit: Payer: Medicaid Other | Admitting: Hematology

## 2016-07-16 ENCOUNTER — Telehealth: Payer: Self-pay | Admitting: Hematology

## 2016-07-16 ENCOUNTER — Ambulatory Visit: Payer: Medicaid Other

## 2016-07-16 ENCOUNTER — Ambulatory Visit (HOSPITAL_BASED_OUTPATIENT_CLINIC_OR_DEPARTMENT_OTHER): Payer: Medicaid Other | Admitting: Hematology

## 2016-07-16 ENCOUNTER — Encounter: Payer: Self-pay | Admitting: *Deleted

## 2016-07-16 VITALS — BP 136/67 | HR 57 | Temp 97.7°F | Resp 20 | Ht 67.0 in | Wt 129.8 lb

## 2016-07-16 DIAGNOSIS — H905 Unspecified sensorineural hearing loss: Secondary | ICD-10-CM | POA: Diagnosis not present

## 2016-07-16 DIAGNOSIS — F5101 Primary insomnia: Secondary | ICD-10-CM

## 2016-07-16 DIAGNOSIS — Z5111 Encounter for antineoplastic chemotherapy: Secondary | ICD-10-CM | POA: Diagnosis present

## 2016-07-16 DIAGNOSIS — C7951 Secondary malignant neoplasm of bone: Secondary | ICD-10-CM | POA: Diagnosis not present

## 2016-07-16 DIAGNOSIS — D649 Anemia, unspecified: Secondary | ICD-10-CM | POA: Diagnosis not present

## 2016-07-16 DIAGNOSIS — M79605 Pain in left leg: Secondary | ICD-10-CM

## 2016-07-16 DIAGNOSIS — G4700 Insomnia, unspecified: Secondary | ICD-10-CM | POA: Diagnosis not present

## 2016-07-16 DIAGNOSIS — C799 Secondary malignant neoplasm of unspecified site: Principal | ICD-10-CM

## 2016-07-16 DIAGNOSIS — C50911 Malignant neoplasm of unspecified site of right female breast: Secondary | ICD-10-CM | POA: Diagnosis present

## 2016-07-16 DIAGNOSIS — R609 Edema, unspecified: Secondary | ICD-10-CM | POA: Diagnosis not present

## 2016-07-16 DIAGNOSIS — H9193 Unspecified hearing loss, bilateral: Secondary | ICD-10-CM

## 2016-07-16 DIAGNOSIS — Z95828 Presence of other vascular implants and grafts: Secondary | ICD-10-CM

## 2016-07-16 DIAGNOSIS — Z7189 Other specified counseling: Secondary | ICD-10-CM

## 2016-07-16 DIAGNOSIS — C50919 Malignant neoplasm of unspecified site of unspecified female breast: Secondary | ICD-10-CM

## 2016-07-16 LAB — CBC WITH DIFFERENTIAL/PLATELET
BASO%: 0.4 % (ref 0.0–2.0)
BASOS ABS: 0 10*3/uL (ref 0.0–0.1)
EOS ABS: 0.2 10*3/uL (ref 0.0–0.5)
EOS%: 3.3 % (ref 0.0–7.0)
HEMATOCRIT: 33.8 % — AB (ref 34.8–46.6)
HEMOGLOBIN: 11.5 g/dL — AB (ref 11.6–15.9)
LYMPH#: 1.9 10*3/uL (ref 0.9–3.3)
LYMPH%: 36.3 % (ref 14.0–49.7)
MCH: 29.7 pg (ref 25.1–34.0)
MCHC: 34 g/dL (ref 31.5–36.0)
MCV: 87.3 fL (ref 79.5–101.0)
MONO#: 0.5 10*3/uL (ref 0.1–0.9)
MONO%: 9.4 % (ref 0.0–14.0)
NEUT#: 2.6 10*3/uL (ref 1.5–6.5)
NEUT%: 50.6 % (ref 38.4–76.8)
Platelets: 213 10*3/uL (ref 145–400)
RBC: 3.87 10*6/uL (ref 3.70–5.45)
RDW: 13 % (ref 11.2–14.5)
WBC: 5.1 10*3/uL (ref 3.9–10.3)

## 2016-07-16 LAB — COMPREHENSIVE METABOLIC PANEL
ALBUMIN: 3.5 g/dL (ref 3.5–5.0)
ALK PHOS: 59 U/L (ref 40–150)
ALT: 15 U/L (ref 0–55)
AST: 16 U/L (ref 5–34)
Anion Gap: 9 mEq/L (ref 3–11)
BILIRUBIN TOTAL: 1.55 mg/dL — AB (ref 0.20–1.20)
BUN: 10.9 mg/dL (ref 7.0–26.0)
CALCIUM: 9.4 mg/dL (ref 8.4–10.4)
CO2: 25 mEq/L (ref 22–29)
Chloride: 104 mEq/L (ref 98–109)
Creatinine: 0.8 mg/dL (ref 0.6–1.1)
EGFR: 85 mL/min/{1.73_m2} — AB (ref >90)
GLUCOSE: 91 mg/dL (ref 70–140)
POTASSIUM: 3.6 meq/L (ref 3.5–5.1)
Sodium: 138 mEq/L (ref 136–145)
TOTAL PROTEIN: 7 g/dL (ref 6.4–8.3)

## 2016-07-16 MED ORDER — ACETAMINOPHEN 325 MG PO TABS
650.0000 mg | ORAL_TABLET | Freq: Once | ORAL | Status: AC
  Administered 2016-07-16: 650 mg via ORAL

## 2016-07-16 MED ORDER — SODIUM CHLORIDE 0.9% FLUSH
10.0000 mL | INTRAVENOUS | Status: DC | PRN
  Administered 2016-07-16: 10 mL via INTRAVENOUS
  Filled 2016-07-16: qty 10

## 2016-07-16 MED ORDER — AMOXICILLIN-POT CLAVULANATE 875-125 MG PO TABS: 1.0000 | ORAL_TABLET | Freq: Two times a day (BID) | ORAL | 0 refills | Status: DC

## 2016-07-16 MED ORDER — LORAZEPAM 1 MG PO TABS: 1.0000 mg | ORAL_TABLET | Freq: Every day | ORAL | 0 refills | Status: DC

## 2016-07-16 MED ORDER — SODIUM CHLORIDE 0.9% FLUSH
10.0000 mL | INTRAVENOUS | Status: DC | PRN
  Administered 2016-07-16: 10 mL
  Filled 2016-07-16: qty 10

## 2016-07-16 MED ORDER — ACETAMINOPHEN 325 MG PO TABS
ORAL_TABLET | ORAL | Status: AC
  Filled 2016-07-16: qty 2

## 2016-07-16 MED ORDER — SODIUM CHLORIDE 0.9 % IV SOLN
3.4000 mg/kg | Freq: Once | INTRAVENOUS | Status: AC
  Administered 2016-07-16: 200 mg via INTRAVENOUS
  Filled 2016-07-16: qty 10

## 2016-07-16 MED ORDER — HEPARIN SOD (PORK) LOCK FLUSH 100 UNIT/ML IV SOLN
500.0000 [IU] | Freq: Once | INTRAVENOUS | Status: AC | PRN
  Administered 2016-07-16: 500 [IU]
  Filled 2016-07-16: qty 5

## 2016-07-16 MED ORDER — SODIUM CHLORIDE 0.9 % IV SOLN
Freq: Once | INTRAVENOUS | Status: AC
  Administered 2016-07-16: 09:00:00 via INTRAVENOUS

## 2016-07-16 NOTE — Patient Instructions (Signed)
Implanted Port Home Guide An implanted port is a type of central line that is placed under the skin. Central lines are used to provide IV access when treatment or nutrition needs to be given through a person's veins. Implanted ports are used for long-term IV access. An implanted port may be placed because:  You need IV medicine that would be irritating to the small veins in your hands or arms.  You need long-term IV medicines, such as antibiotics.  You need IV nutrition for a long period.  You need frequent blood draws for lab tests.  You need dialysis.  Implanted ports are usually placed in the chest area, but they can also be placed in the upper arm, the abdomen, or the leg. An implanted port has two main parts:  Reservoir. The reservoir is round and will appear as a small, raised area under your skin. The reservoir is the part where a needle is inserted to give medicines or draw blood.  Catheter. The catheter is a thin, flexible tube that extends from the reservoir. The catheter is placed into a large vein. Medicine that is inserted into the reservoir goes into the catheter and then into the vein.  How will I care for my incision site? Do not get the incision site wet. Bathe or shower as directed by your health care provider. How is my port accessed? Special steps must be taken to access the port:  Before the port is accessed, a numbing cream can be placed on the skin. This helps numb the skin over the port site.  Your health care provider uses a sterile technique to access the port. ? Your health care provider must put on a mask and sterile gloves. ? The skin over your port is cleaned carefully with an antiseptic and allowed to dry. ? The port is gently pinched between sterile gloves, and a needle is inserted into the port.  Only "non-coring" port needles should be used to access the port. Once the port is accessed, a blood return should be checked. This helps ensure that the port  is in the vein and is not clogged.  If your port needs to remain accessed for a constant infusion, a clear (transparent) bandage will be placed over the needle site. The bandage and needle will need to be changed every week, or as directed by your health care provider.  Keep the bandage covering the needle clean and dry. Do not get it wet. Follow your health care provider's instructions on how to take a shower or bath while the port is accessed.  If your port does not need to stay accessed, no bandage is needed over the port.  What is flushing? Flushing helps keep the port from getting clogged. Follow your health care provider's instructions on how and when to flush the port. Ports are usually flushed with saline solution or a medicine called heparin. The need for flushing will depend on how the port is used.  If the port is used for intermittent medicines or blood draws, the port will need to be flushed: ? After medicines have been given. ? After blood has been drawn. ? As part of routine maintenance.  If a constant infusion is running, the port may not need to be flushed.  How long will my port stay implanted? The port can stay in for as long as your health care provider thinks it is needed. When it is time for the port to come out, surgery will be   done to remove it. The procedure is similar to the one performed when the port was put in. When should I seek immediate medical care? When you have an implanted port, you should seek immediate medical care if:  You notice a bad smell coming from the incision site.  You have swelling, redness, or drainage at the incision site.  You have more swelling or pain at the port site or the surrounding area.  You have a fever that is not controlled with medicine.  This information is not intended to replace advice given to you by your health care provider. Make sure you discuss any questions you have with your health care provider. Document  Released: 12/29/2004 Document Revised: 06/06/2015 Document Reviewed: 09/05/2012 Elsevier Interactive Patient Education  2017 [employer]  

## 2016-07-16 NOTE — Telephone Encounter (Signed)
Scheduled appt per 7/5 los - Gave patient AVS and calender per los.  

## 2016-07-16 NOTE — Patient Instructions (Signed)
[facility] Discharge Instructions for Patients Receiving Chemotherapy  Today you received the following chemotherapy agents kadcyla  To help prevent nausea and vomiting after your treatment, we encourage you to take your nausea medication as ordered per md   If you develop nausea and vomiting that is not controlled by your nausea medication, call the clinic.   BELOW ARE SYMPTOMS THAT SHOULD BE REPORTED IMMEDIATELY:  *FEVER GREATER THAN 100.5 F  *CHILLS WITH OR WITHOUT FEVER  NAUSEA AND VOMITING THAT IS NOT CONTROLLED WITH YOUR NAUSEA MEDICATION  *UNUSUAL SHORTNESS OF BREATH  *UNUSUAL BRUISING OR BLEEDING  TENDERNESS IN MOUTH AND THROAT WITH OR WITHOUT PRESENCE OF ULCERS  *URINARY PROBLEMS  *BOWEL PROBLEMS  UNUSUAL RASH Items with * indicate a potential emergency and should be followed up as soon as possible.  Feel free to call the clinic you have any questions or concerns. The clinic phone number is [phone].  Please show the [facility] at check-in to the Emergency Department and triage nurse.

## 2016-07-16 NOTE — Progress Notes (Signed)
[facility] Work  Clinical Social Work was referred by Futures trader for assessment of psychosocial needs and assistance with resources due to recent plan for pt to move to [place] to live with a friend and need to get connected with local resources.  Clinical Social Worker contacted pt via email as pt is hearing impaired and needs sign languge interpreter for appointments. CSW encouraged pt to reach out to CSW to help with resources and to further discuss possible move needs. CSW awaits pt's response.   Loren Racer, LCSW, OSW-C Clinical Social Worker [place]  [facility] Phone: [phone] Fax: [phone]

## 2016-07-16 NOTE — Progress Notes (Signed)
Per Dr Burr Medico ok to tx today with bilirubin of 1.55

## 2016-07-17 LAB — CANCER ANTIGEN 27.29: CA 27.29: 44 U/mL — ABNORMAL HIGH (ref 0.0–38.6)

## 2016-07-18 ENCOUNTER — Encounter: Payer: Self-pay | Admitting: Hematology

## 2016-07-21 ENCOUNTER — Ambulatory Visit (INDEPENDENT_AMBULATORY_CARE_PROVIDER_SITE_OTHER): Payer: Medicaid Other

## 2016-07-21 ENCOUNTER — Encounter (INDEPENDENT_AMBULATORY_CARE_PROVIDER_SITE_OTHER): Payer: Self-pay | Admitting: Orthopaedic Surgery

## 2016-07-21 ENCOUNTER — Ambulatory Visit (INDEPENDENT_AMBULATORY_CARE_PROVIDER_SITE_OTHER): Payer: Medicaid Other | Admitting: Orthopaedic Surgery

## 2016-07-21 VITALS — BP 118/80 | HR 82 | Ht 67.0 in | Wt 129.0 lb

## 2016-07-21 DIAGNOSIS — M25552 Pain in left hip: Secondary | ICD-10-CM | POA: Diagnosis not present

## 2016-07-21 DIAGNOSIS — M25551 Pain in right hip: Secondary | ICD-10-CM

## 2016-07-23 ENCOUNTER — Ambulatory Visit (INDEPENDENT_AMBULATORY_CARE_PROVIDER_SITE_OTHER): Payer: Medicaid Other | Admitting: Internal Medicine

## 2016-07-23 ENCOUNTER — Encounter: Payer: Self-pay | Admitting: Internal Medicine

## 2016-07-23 VITALS — BP 100/68 | HR 77 | Temp 98.4°F | Ht 67.0 in | Wt 126.4 lb

## 2016-07-23 DIAGNOSIS — M542 Cervicalgia: Secondary | ICD-10-CM

## 2016-07-23 DIAGNOSIS — G4700 Insomnia, unspecified: Secondary | ICD-10-CM

## 2016-07-23 MED ORDER — TRAZODONE HCL 50 MG PO TABS: 50.0000 mg | ORAL_TABLET | Freq: Every day | ORAL | 0 refills | Status: DC

## 2016-07-23 MED ORDER — AZITHROMYCIN 200 MG/5ML PO SUSR: ORAL | 0 refills | Status: DC

## 2016-07-23 NOTE — Patient Instructions (Signed)
[name],  Please take azithromycin for the next 5 days. This would cover for lymph node swelling from cat scratch or sinus infection.  Return if you have fever, trouble swallowing, trouble moving your head.  I would wait on TDAP vaccine until after your symptoms have resolved.  Try melatonin 5 mg about 1 hour before bed. If this does not work, try the trazodone. Do not take with other sleep aids.  Best, Dr. Ola Spurr

## 2016-07-23 NOTE — Progress Notes (Signed)
[facility] Progress Note  Subjective:  [name] is a 51 y.o. with history of metastatic breast cancer (currently undergoing chemotherapy) and congenital deafness who presents for neck pain. In-person sign language interpreter present.   #Neck pain - Ongoing since beginning of the month. Began as swelling of left jaw that now has moved more to the right side. Patient has noticed painful lump of her R neck. - Her oncologist Dr. Burr Medico prescribed augmentin on 7/5, but pills were too large for patient to take. However, patient denies trouble swallowing. Dr. Burr Medico was concerned about dental infection and encouraged patient to see dentist. - Patient denies trouble eating. Does not hurt to chew or brush her teeth. No fevers. Has not noticed redness of her skin or rashes. - No recent travel. Does have pets, including a kitten that has scratched her. - Reports sick contact of friend with a cough.  - Some nasal congestion.  ROS: No n/v/d. No neck stiffness. No sinus pain or headaches.  #Insomnia: - Patient having trouble staying asleep.  - Neither ambien nor ativan have helped - Has never tried melatonin  Chief Complaint  Patient presents with  . Neck Pain   Social: Never smoker, plans to move to [place] state soon  Objective: Blood pressure 100/68, pulse 77, temperature 98.4 F (36.9 C), temperature source Oral, height 5\' 7"  (1.702 m), weight 126 lb 6.4 oz (57.3 kg), SpO2 100 %. Body mass index is 19.8 kg/m. Constitutional: Thin female, in NAD HENT: Normal posterior oropharynx. Multiple fillings but no obvious gum irritation. Left TM with fluid behind it but not erythematous. R TM normal.  Neck: FROM and no pain with neck flexion or lateral rotation. Prominent lymph nodes on right.  Lymph: 2 cm area of lymphadenopathy of anterior cervical chain on R that is TTP. 1 cm area that is TTP of R posterior chain.  Cardiovascular: RRR, S1, S2, no m/r/g.  Pulmonary/Chest:  Effort normal and breath sounds normal. No respiratory distress.  Neurological: AOx3, no focal deficits. Skin: Skin is warm and dry. No rash noted. No erythema over portacath of left chest.  Vitals reviewed  Assessment/Plan: Neck pain - Patient with painful lymphadenitis. Not affecting ability to eat and is afebrile. Could be secondary to sinus infection vs cat scratch.  - Unable to tolerate augmentin due to size of pill. Will switch to liquid azithromycin to cover for upper respiratory illness and cat scratch.  - Gave strict return precautions (shortness of breath, stiff neck, trouble swallowing, increasing pain)  Insomnia - Counseled patient to try melatonin an hour before bed. - If this does not help, provided prescription for trazodone that patient could fill. Counseled not to take with other sleep aids or sedative medications. Patient expressed understanding.   Follow-up prn.  Olene Floss, MD [facility], PGY-3

## 2016-07-24 NOTE — Progress Notes (Signed)
Office Visit Note   Patient: [name]           Date of Birth: Mar 24, 1965           MRN: [id number] Visit Date: 07/21/2016              Requested by: [name], MD [address] [place], [place] [address] PCP: [name], MD   Assessment & Plan: Visit Diagnoses:  1. Bilateral hip pain   2. Metastatic Breast CA with pelvic and left femur mets  Plan: Patient's pain is likely related to her metastatic bone disease in the left iliac wing and right supra-acetabular region. No findings on exam to suggest presence of myelopathy with her T11 lesion. She is undergoing chemotherapy. She is getting ready to move out of state and she can return as needed or she has increased evidence of bone destruction.  Follow-Up Instructions: Return if symptoms worsen or fail to improve.   Orders:  Orders Placed This Encounter  Procedures  . XR FEMUR MIN 2 VIEWS LEFT  . XR Pelvis 1-2 Views   No orders of the defined types were placed in this encounter.     Procedures: No procedures performed   Clinical Data: No additional findings.   Subjective: Chief Complaint  Patient presents with  . Left Hip - Pain  . Right Hip - Pain    HPI patient is deaf and is here with a sign interpreter. She had previous metastatic breast cancer to the femur had a trochanteric nail placed for stabilization of the femur with intramedullary metastasis. She is at a recent PET scan that showed right supra-acetabular involvement of the iliac bone as well as T11 on the left side and also left iliac wing involvement. She states she is getting ready to move out of state to be with the closer friends and family.  Review of Systems 14 part review of systems is updated and is unchanged other than as mentioned above in history of present illness. Patient has metastatic breast cancer with bony metastasis, congenital deafness. Anxiety and depression.   Objective: Vital Signs: BP 118/80 (BP Location: Left  Arm, Patient Position: Sitting, Cuff Size: Normal)   Pulse 82   Ht 5\' 7"  (1.702 m)   Wt 129 lb (58.5 kg)   BMI 20.20 kg/m   Physical Exam  Constitutional: She is oriented to person, place, and time. She appears well-developed.  HENT:  Head: Normocephalic.  Right Ear: External ear normal.  Left Ear: External ear normal.  Eyes: Pupils are equal, round, and reactive to light.  Neck: No tracheal deviation present. No thyromegaly present.  Cardiovascular: Normal rate.   Pulmonary/Chest: Effort normal.  Abdominal: Soft.  Musculoskeletal:  Patient is ambulatory. With weightbearing changing position she complains of some pain in the pelvic region both right and left. Reflexes are intact. Left hip incision is completely healed. No palpable nodules minutes of the metastatic lesions in the muscles of the thigh. Distal pulses are intact.  Neurological: She is alert and oriented to person, place, and time.  Skin: Skin is warm and dry.  Psychiatric: She has a normal mood and affect. Her behavior is normal.    Ortho Exam  Specialty Comments:  No specialty comments available.  Imaging: No results found.   PMFS History: Patient Active Problem List   Diagnosis Date Noted  . Goals of care, counseling/discussion 03/05/2016  . MRSA (methicillin resistant Staphylococcus aureus) infection 01/18/2016  . Hypokalemia 01/18/2016  .  Folliculitis 99/83/3825  . Blood blister 01/10/2016  . Anemia due to antineoplastic chemotherapy 12/11/2015  . Lymphedema of left leg 12/11/2015  . Osteopenia of multiple sites 11/26/2015  . Drug-induced diarrhea 11/26/2015  . Pain of left hip joint 11/08/2015  . Chemotherapy-induced peripheral neuropathy ([place]) 11/03/2015  . Port catheter in place 09/26/2015  . Chemotherapy induced nausea and vomiting 08/13/2015  . Cancer related pain 08/13/2015  . Portacath in place 08/13/2015  . Drug allergy, multiple 08/13/2015  . [name], right  ([place]) 07/14/2015  . Other iron deficiency anemias 07/14/2015  . Claustrophobia 07/14/2015  . Status post laparoscopic cholecystectomy 07/14/2015  . S/P TRAM (transverse rectus abdominis muscle) flap breast reconstruction 07/14/2015  . Thyroid nodule 07/09/2015  . Constipation due to pain medication   . Left hip pain   . Depression   . Generalized anxiety disorder   . Esophageal reflux   . Genetic testing 04/04/2015  . Congenital deafness 02/26/2015  . Noncompliance with therapeutic plan 02/26/2015  . Hx of BSO (bilateral salpingo-oophorectomy) 02/26/2015  . Premature surgical menopause 02/26/2015  . Benign paroxysmal positional vertigo 07/24/2014  . Migraine 07/24/2014  . RLQ abdominal pain 09/30/2013  . Dysuria 09/30/2013  . Hx of breast cancer 04/28/2013  . Colon cancer screening 04/28/2013  . Migraine without aura, with intractable migraine, so stated, without mention of status migrainosus 06/22/2012  . Dizziness 05/14/2011  . Pharyngitis, acute 09/04/2010  . BACK STRAIN, LUMBAR 01/21/2010  . DE QUERVAIN'S TENOSYNOVITIS, LEFT WRIST 10/07/2009  . EMESIS 02/05/2009  . ABDOMINAL PAIN, GENERALIZED 02/05/2009  . DIZZINESS, CHRONIC 01/25/2007  . Insomnia 01/25/2007  . Hearing loss 03/11/2006  . CERVICAL DYSPLASIA 03/11/2006  . MENSTRUATION, PAINFUL 03/11/2006   Past Medical History:  Diagnosis Date  . Abnormal Pap smear   . Anxiety   . Breast cancer ([facility])   . Cancer ([place]) 1999   breast-s/p mastectomy, chemo, rad  . CIN I (cervical intraepithelial neoplasia I) 2003   by colpo  . Congenital deafness   . Deaf   . Depression   . Radiation 07/12/15-07/26/15   left femur 30 Gy  . S/P bilateral oophorectomy     Family History  Problem Relation Age of Onset  . Hypertension Mother   . Seizures Mother   . Alzheimer's disease Maternal Uncle   . Pancreatitis Maternal Grandmother   . Heart attack Maternal Grandfather     Past Surgical History:  Procedure Laterality Date  .  ABDOMINAL HYSTERECTOMY    . INTRAMEDULLARY (IM) NAIL INTERTROCHANTERIC Left 06/26/2015   Procedure: LEFT BIOMET LONG AFFIXS NAIL;  Surgeon: [name], MD;  Location: [facility];  Service: Orthopedics;  Laterality: Left;  . IR GENERIC HISTORICAL  08/06/2015   IR US GUIDE VASC ACCESS LEFT 08/06/2015 Aletta Edouard, MD [facility]  . IR GENERIC HISTORICAL  08/06/2015   IR FLUORO GUIDE CV LINE LEFT 08/06/2015 Aletta Edouard, MD [facility]  . IR GENERIC HISTORICAL  03/05/2016   IR CV LINE INJECTION 03/05/2016 Markus Daft, MD [facility]  . MASTECTOMY     right breast  . MASTECTOMY    . OVARIAN CYST REMOVAL    . RADIOLOGY WITH ANESTHESIA Left 06/25/2015   Procedure: MRI OF LEFT HIP WITH OR WITHOUT CONTRAST;  Surgeon: Medication Radiologist, MD;  Location: [facility];  Service: Radiology;  Laterality: Left;  DR. MCINTYRE/MRI  . TUBAL LIGATION     Social History   Occupational History  . Not on file.  Social History Main Topics  . Smoking status: Never Smoker  . Smokeless tobacco: Never Used  . Alcohol use No  . Drug use: No  . Sexual activity: Yes    Birth control/ protection: Surgical

## 2016-07-25 DIAGNOSIS — M542 Cervicalgia: Secondary | ICD-10-CM | POA: Insufficient documentation

## 2016-07-25 NOTE — Assessment & Plan Note (Signed)
-   Patient with painful lymphadenitis. Not affecting ability to eat and is afebrile. Could be secondary to sinus infection vs cat scratch.  - Unable to tolerate augmentin due to size of pill. Will switch to liquid azithromycin to cover for upper respiratory illness and cat scratch.  - Gave strict return precautions (shortness of breath, stiff neck, trouble swallowing, increasing pain)

## 2016-07-25 NOTE — Assessment & Plan Note (Signed)
-   Counseled patient to try melatonin an hour before bed. - If this does not help, provided prescription for trazodone that patient could fill. Counseled not to take with other sleep aids or sedative medications. Patient expressed understanding.

## 2016-07-27 ENCOUNTER — Telehealth: Payer: Self-pay | Admitting: *Deleted

## 2016-07-27 ENCOUNTER — Telehealth (INDEPENDENT_AMBULATORY_CARE_PROVIDER_SITE_OTHER): Payer: Self-pay | Admitting: Radiology

## 2016-07-27 ENCOUNTER — Other Ambulatory Visit: Payer: Self-pay | Admitting: *Deleted

## 2016-07-27 DIAGNOSIS — C7951 Secondary malignant neoplasm of bone: Principal | ICD-10-CM

## 2016-07-27 DIAGNOSIS — C50911 Malignant neoplasm of unspecified site of right female breast: Secondary | ICD-10-CM

## 2016-07-27 MED ORDER — HYDROCODONE-ACETAMINOPHEN 5-325 MG PO TABS: 1.0000 | ORAL_TABLET | Freq: Four times a day (QID) | ORAL | 0 refills | Status: DC | PRN

## 2016-07-27 NOTE — Telephone Encounter (Signed)
done

## 2016-07-27 NOTE — Telephone Encounter (Signed)
Please refill.   [name], she may come in to pick up prescription. Do you want to meet her if the schedule works out? Thanks   Truitt Merle MD

## 2016-07-27 NOTE — Telephone Encounter (Signed)
Called pt on cell phone via interpreter, and left message on voice mail that script for Hydrocodone is ready for pick up.

## 2016-07-27 NOTE — Telephone Encounter (Signed)
Call from pt (via sign language interpreter) she is out of Hydrocodone. Needs to pick up a prescription today. Message to collaborative RN for script.

## 2016-07-27 NOTE — Telephone Encounter (Signed)
Can you make a copy of patients xrays on CD no charge? I'm going to mail this with copy of her office note.

## 2016-07-30 ENCOUNTER — Encounter: Payer: Self-pay | Admitting: Internal Medicine

## 2016-07-30 ENCOUNTER — Ambulatory Visit (INDEPENDENT_AMBULATORY_CARE_PROVIDER_SITE_OTHER): Payer: Medicaid Other | Admitting: Internal Medicine

## 2016-07-30 VITALS — BP 112/68 | HR 75 | Temp 97.7°F | Ht 67.0 in | Wt 127.0 lb

## 2016-07-30 DIAGNOSIS — R21 Rash and other nonspecific skin eruption: Secondary | ICD-10-CM | POA: Diagnosis not present

## 2016-07-30 DIAGNOSIS — Z23 Encounter for immunization: Secondary | ICD-10-CM

## 2016-07-30 NOTE — Patient Instructions (Signed)
[name],  I am not sure what caused your rash, but I suspect your skin got in contact with something outside.  Please try hydrocortisone cream 1% twice daily. Calamine lotion may also help with itching.  Best, Dr. Ola Spurr

## 2016-07-30 NOTE — Progress Notes (Signed)
[facility] Progress Note  Subjective:  [name] is a 51 y.o. female who has history of congenital deafness and metastatic breast cancer, currently undergoing chemotherapy, who presents for rash. In-person Sign Language interpreter present.   #Rash: - Began yesterday as small bumps of R forearm, spreading towards wrist - Very itchy - Tried cold water on it, which helps a little - Is concerned rash is due to trazodone, as this was a new medication. She says this did not help with sleep. - Completed course of azithromycin earlier this week for lymphadenitis - Was outside most of yesterday for a yard sale but says she was just sitting - Rash has improved some from yesterday - No one else in the family with rash ROS: No SOB, no fever  Objective: Blood pressure 112/68, pulse 75, temperature 97.7 F (36.5 C), temperature source Oral, height 5\' 7"  (1.702 m), weight 127 lb (57.6 kg), SpO2 98 %. Body mass index is 19.89 kg/m. Constitutional: Thin, well-appearing female in NAD HENT: Normal posterior oropharynx. Lymph: No anterior or posterior cervical lymphadenopathy.  Cardiovascular: RRR, S1, S2, no m/r/g.  Pulmonary/Chest: Effort normal and breath sounds normal. No respiratory distress.  Skin: papular, erythematous rash of R antecubital fossa with linear excoriations (see picture below) and few scattered papules along forearm Vitals reviewed      Assessment/Plan: Rash and nonspecific skin eruption - Suspect contact dermatitis from environmental exposure.  - Counseled patient to try OTC hydrocortisone 1% cream twice daily as needed and/or calamine lotion to help with itching - Stop trazodone, as not helping sleep and patient associates with rash  Health Maintenance: TDAP administered today.   Follow-up prn.  Olene Floss, MD [place], PGY-2

## 2016-07-31 DIAGNOSIS — R21 Rash and other nonspecific skin eruption: Secondary | ICD-10-CM | POA: Insufficient documentation

## 2016-07-31 NOTE — Assessment & Plan Note (Signed)
-   Suspect contact dermatitis from environmental exposure.  - Counseled patient to try OTC hydrocortisone 1% cream twice daily as needed and/or calamine lotion to help with itching - Stop trazodone, as not helping sleep and patient associates with rash

## 2016-07-31 NOTE — Progress Notes (Signed)
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 She had mastectomy with the 7 axillary node evaluation, 4 cycles of adriamycin/ cytoxan followed by taxotere, then five years of tamoxifen thru June 2005 (no herceptin in 1999). She had bilateral oophorectomy in May 2005. She was briefly on aromatase inhibitor after tamoxifen, but discontinued this herself due to poor tolerance.      04/04/2015 Genetic Testing    Negative genetic testing on the breast/ovarian cancer pnael.  The Breast/Ovarian gene panel offered by [facility] includes sequencing and rearrangement analysis for the following 20 genes:  ATM, BARD1, BRCA1, BRCA2, BRIP1, CDH1, CHEK2, EPCAM, FANCC, MLH1, MSH2, MSH6, NBN, PALB2, PMS2, PTEN, RAD51C, RAD51D, TP53, and XRCC2.      06/26/2015 Pathology Results    Initial Path Report Bone, curettage, Left femur met, intramedullary subtroch tissue - METASTATIC ADENOCARCINOMA.  Estrogen Receptor: 95%, POSITIVE, STRONG STAINING INTENSITY Progesterone Receptor: 2%, POSITIVE, STRONG  HER2 (+), IHC 3+      06/26/2015 Surgery    Biopsy of metastatic tissue and stabilization with Affixus trochanteric nail, proximal and distal interlock of left femur by Dr. [name]       06/28/2015 Imaging    CT Chest w/ Contrast 1. Extensive right internal mammary chain lymphadenopathy consistent with metastatic breast cancer. 2. Lytic metastases involving the posterior elements at T1 with probable  nondisplaced pathologic fracture of the spinous process. 3. No other evidence of thoracic metastatic disease. 4. Trace bilateral pleural effusions with associated bibasilar atelectasis. 5. Indeterminate right thyroid nodule, not previously imaged. This could be evaluated with thyroid ultrasound as clinically warranted.      07/01/2015 Imaging    Bone Scan 1. Increased activity noted throughout the left femur. Although metastatic disease cannot be excluded, these changes are most likely secondary to prior surgery. 2. No other focal abnormalities identified to suggest metastatic disease.      07/12/2015 - 07/26/2015 Radiation Therapy    Left femur, 30 Gy in 10 fractions by Dr. Sondra Come       07/14/2015 Initial Diagnosis    [name], right ([place])     07/19/2015 Imaging    Initial PET Scan 1. Severe multifocal osseous metastatic disease, much greater than anticipated based on prior imaging. 2. Multifocal nodal metastases involving the right internal mammary, prevascular and subpectoral lymph nodes. No axillary pulmonary involvement identified. 3. No distant extra osseous metastases.      07/30/2015 - 11/29/2015 Chemotherapy    Docetaxel, Herceptin and Perjeta every 3 weeks X6 cycles, pt tolerated moderately well, restaging scan showed excellent response       09/18/2015 Imaging    CT Head w/wo Contrast 1. No acute intracranial abnormality or significant interval change. 2. Stable minimal periventricular white matter hypoattenuation on the right. This may reflect a remote ischemic injury. 3. No evidence for metastatic disease to the brain. 4. No focal soft tissue lesion to explain the patient's tenderness.      10/30/2015  Imaging    CT Abdomen Pelvis w/ Contrast 1. Few mildly prominent fluid-filled loops of small bowel scattered throughout the abdomen, with intraluminal fluid density within the distal colon. Given the provided history, findings are suggestive of  acute enteritis/diarrheal illness. 2. No other acute intra-abdominal or pelvic process identified. 3. Widespread osseous metastatic disease, better evaluated on most recent PET-CT from 07/19/2015. No pathologic fracture or other complication. 4. 11 mm hypodensity within the left kidney. This lesion measures intermediate density, and is indeterminate. While this lesion is similar in size relative to recent studies, this is increased in size relative to prior study from 2012. Further evaluation with dedicated renal mass protocol CT and/or MRI is recommended for complete characterization.      12/25/2015 Imaging    Restaging PET Scan 1. Markedly improved skeletal activity, with only several faint foci of residual accentuated metabolic activity, but resolution of the vast majority of the previously extensive osseous metastatic disease. 2. Resolution of the prior right internal mammary and right prevascular lymph nodes. 3. Residual subcutaneous edema and edema along fascia planes in the left upper thigh. This remains somewhat more than I would expect for placement of an IM nail 6 weeks ago. If there is leg swelling further down, consider Doppler venous ultrasound to rule out left lower extremity DVT. I do not perceive an obvious difference in density between the pelvic and common femoral veins on the noncontrast CT data. 4. Chronic right maxillary and right sphenoid sinusitis.      12/2015 -  Chemotherapy    Maintenance Herceptin and pejeta every 3 weeks      01/16/2016 -  Anti-estrogen oral therapy    Letrozole 2.5 mg daily      03/01/2016 Miscellaneous    Patient presented to ED following a fall; she reports she landed on her back and is now experiencing back pain. The patient was evaluated and discharge home same day with pain control.      05/01/2016 Imaging    CT CAP IMPRESSION: 1. Stable exam.  No new or progressive disease identified. 2. No evidence for residual or recurrent adenopathy  within the chest. 3. Stable bone metastasis.      05/01/2016 Imaging    BONE SCAN IMPRESSION: 1. Small focal uptake involving the anterior rib ends of the left second and right fourth ribs, new since the prior bone scan. The location of this uptake is more suggestive of a traumatic or inflammatory etiology as opposed to metastatic disease. No other evidence of metastatic disease. 2. There are other areas of stable uptake which are most likely degenerative/reactive, including right shoulder and cervical spine uptake and uptake along the right femur adjacent to the ORIF hardware.      05/20/2016 Imaging    NM PET Skull to Thigh IMPRESSION: 1. There multiple metastatic foci in the bony pelvis, including some new abnormal foci compare to the prior PET-CT, compatible with mildly progressive bony metastatic disease. Also, a left T11 vertebral hypermetabolic metastatic lesion is observed, new compared to the prior exam. 2. No active extraosseous malignancy is currently identified. 3. Right anterior fourth rib healing fracture, appears be      06/04/2016 -  Chemotherapy    Second line chemotherapy Kadcyla every 3 weeks started on 06/04/16        HISTORY OF PRESENTING ILLNESS (From Dr. Mariana Kaufman note on 02/06/2016):  [name] 51 y.o. female is seen, together with sign language interpreter, in continuing attention to metastatic ER+ HER  2+ right breast cancer involving bone. She has had an excellent response to 6 cycles of taxotere herceptin perjeta from 07-30-15 thru 11-29-15. She is continuing herceptin and perjeta every 3 weeks. She had extreme local pain with first xgeva injection on 01-16-16 and prefers resuming previous zometa. She began letrozole ~ 01-16-16, has no complaints about this at all.  Last imaging was PET 12-25-15.  NOTE CTs, plain Xrays and bone scan did NOT show any of the extensively metastatic bone involvement 06-2015; MRI and PET did image disease  clearly.  Patient continues to do very well overall. MRSA skin lesions on scalp and face improved quickly with doxycycline starting 01-16-16. She continues to use Dial soap and we have again strongly encouraged good hygiene with other family members at home. She has had the 3 lymphedema PT sessions allowed by insurance, continues to do massage for LLE herself, still has not gotten the ordered compression apparatus for home and will follow up with PT on that. Swelling in LLE has improved with the interventions and does not seem as uncomfortable. She denies any pain. Appetite is excellent, bowels fine, no SOB, good energy, no problems with PAC, no noted changes in left breast. No bleeding. No fever or symptoms of infection. She slipped in tub last week, fell onto right arm fortunately without injury. She was upset by a visitor and shaky then, but no tremors otherwise.   She has two children and 4 grandchildren. She has had genetic testing and it was negative. She lives at home with her husband. She is very active. Her children live close to her.   CURRENT THERAPY:  Letrozole 2.10m daily, second line Kadcyla every 3 weeks started on 06/04/16  INTERIM HISTORY:  She returns today for follow up and ongoing treatment. She presents to the clinic today with her interpreter.  [place] still having pain and her PCP gave her trazodone but she got a rash. She stopped taking it and her rash is improving. She still cannot sleep. Mirtazapine did not work even with 2 pills. She gets about 2 hours of sleep because of many interruptions.  She reports she [place] She says her pain medication hydrocodone is fine and does not need a refill.  She requests a bed for infusion because of her legs while sitting there.  [place] She [place] driving with 2 other people.  Her phone will be disconnected aug[place] 1st [place]        MEDICAL HISTORY:  Past Medical History:  Diagnosis Date  . Abnormal Pap smear   . Anxiety   . Breast cancer ([place]   . [place]   breast-s/p mastectomy, chemo, rad  . CIN I (cervical intraepithelial neoplasia I) 2003   by colpo  . Congenital deafness   . Deaf   . Depression   . Radiation 07/12/15-07/26/15   left femur 30 Gy  . S/P bilateral oophorectomy     SURGICAL HISTORY: Past Surgical History:  Procedure Laterality Date  . ABDOMINAL HYSTERECTOMY    . INTRAMEDULLARY (IM) NAIL INTERTROCHANTERIC Left 06/26/2015   Procedure: LEFT BIOMET LONG AFFIXS NAIL;  Surgeon: MMarybelle Killings MD;  [facility]  Service: Orthopedics;  Laterality: Left;  . IR GENERIC HISTORICAL  08/06/2015   IR [place] V[place] ACCESS LEFT 08/06/2015 GAletta Edouard MD [facility]  . IR GENERIC HISTORICAL  08/06/2015   IR FLUORO GUIDE  CV LINE LEFT 08/06/2015 Aletta Edouard, MD [facility]  . IR GENERIC HISTORICAL  03/05/2016   IR CV LINE INJECTION 03/05/2016 Markus Daft, MD [facility]  . MASTECTOMY     right breast  . MASTECTOMY    . OVARIAN CYST REMOVAL    . RADIOLOGY WITH ANESTHESIA Left 06/25/2015   Procedure: MRI OF LEFT HIP WITH OR WITHOUT CONTRAST;  Surgeon: Medication Radiologist, MD;  Location: [facility];  Service: Radiology;  Laterality: Left;  DR. MCINTYRE/MRI  . TUBAL LIGATION      SOCIAL HISTORY: Social History   Social History  . Marital status: Married    Spouse name: N/A  . Number of children: 2  . Years of education: N/A   Occupational History  . Not on file.   Social History Main Topics  . Smoking status: Never Smoker  . Smokeless tobacco: Never Used  . Alcohol use No  . Drug use: No  . Sexual activity: Yes    Birth control/ protection: Surgical   Other Topics Concern  . Not on file   Social History Narrative  . No narrative on file    FAMILY HISTORY: Family History  Problem Relation Age of Onset  . Hypertension Mother   . Seizures Mother   . Alzheimer's  disease Maternal Uncle   . Pancreatitis Maternal Grandmother   . Heart attack Maternal Grandfather     ALLERGIES:  is allergic to trazodone and nefazodone; chlorhexidine; promethazine hcl; and diphenhydramine hcl.  MEDICATIONS:  Current Outpatient Prescriptions  Medication Sig Dispense Refill  . Cholecalciferol 2000 units CAPS Take 1 capsule by mouth daily.    . divalproex (DEPAKOTE) 250 MG DR tablet Take 1 tablet (250 mg total) by mouth at bedtime. 30 tablet 2  . HYDROcodone-acetaminophen (NORCO) 5-325 MG tablet Take 1 tablet by mouth every 6 (six) hours as needed for moderate pain. 30 tablet 0  . letrozole (FEMARA) 2.5 MG tablet Take 1 tablet (2.5 mg total) by mouth daily. 30 tablet 2  . lidocaine-prilocaine (EMLA) cream Apply 1 application topically as needed. Apply to Porta-Cath 1-2 hours prior to access as directed. 30 g 1  . potassium chloride (K-DUR) 10 MEQ tablet Take 2 tablets (20 mEq total) by mouth daily. (Patient taking differently: Take 10 mEq by mouth daily. ) 60 tablet 1  . loperamide (IMODIUM A-D) 2 MG tablet Take 1-2 tablets after each loose stool. UP TO 6 IN  24 HRS. (Patient not taking: Reported on 08/06/2016) 30 tablet 1  . LORazepam (ATIVAN) 1 MG tablet Place one tablet under the tongue or swallow every 4-6 hours as needed for nausea. (THIS IS THE SAME STRENGTH AS 2 OF YOUR 0.5 MG TABLETS) (Patient not taking: Reported on 08/06/2016) 30 tablet 0  . ondansetron (ZOFRAN) 8 MG tablet Take 1 tablet (8 mg total) by mouth every 8 (eight) hours as needed for nausea or vomiting. (Patient not taking: Reported on 08/06/2016) 30 tablet 0  . prochlorperazine (COMPAZINE) 10 MG tablet Take 1 tablet (10 mg total) by mouth every 6 (six) hours as needed for nausea or vomiting. (Patient not taking: Reported on 08/06/2016) 30 tablet 2   No current facility-administered medications for this visit.    Facility-Administered Medications Ordered in Other Visits  Medication Dose Route Frequency  Provider Last Rate Last Dose  . sodium chloride 0.9 % 1,000 mL with potassium chloride 10 mEq infusion   Intravenous Continuous Gordy Levan, MD   Stopped at 09/10/15 1653  .  sodium chloride 0.9 % injection 10 mL  10 mL Intravenous PRN Livesay, Lennis P, MD        REVIEW OF SYSTEMS:  Constitutional: Denies fevers, chills or abnormal night sweats (+)nausea (+)insominia  Eyes: Denies blurriness of vision, double vision or watery eyes Ears, nose, mouth, throat, and face: Denies mucositis or sore throat (+) deaf Respiratory: Denies cough, dyspnea or wheezes Cardiovascular: Denies palpitation, chest discomfort or lower extremity swelling Gastrointestinal:  Denies nausea, heartburn or change in bowel habits Lymphatics: Denies new lymphadenopathy or easy bruising. MSK: (+) Left leg pain  Behavioral/Psych: (+)mild anxiety All other systems were reviewed with the patient and are negative.  PHYSICAL EXAMINATION: ECOG PERFORMANCE STATUS: 1 BP 128/78 (BP Location: Left Arm, Patient Position: Sitting)   Pulse 61   Temp 97.6 F (36.4 C) (Oral)   Resp 18   Ht 5' 7" (1.702 m)   Wt 127 lb 11.2 oz (57.9 kg)   SpO2 100%   BMI 20.00 kg/m   GENERAL:alert, no distress and comfortable (+)swelling and tenderness to  Left jaw (+)cavities EYES: normal, conjunctiva are pink and non-injected, sclera clear OROPHARYNX:no exudate, no erythema and lips, buccal mucosa, and tongue normal  NECK: supple, thyroid normal size, non-tender, without nodularity LYMPH:  no palpable lymphadenopathy in the cervical, axillary or inguinal LUNGS: clear to auscultation and percussion with normal breathing effort HEART: regular rate & rhythm and no murmurs and no lower extremity edema ABDOMEN:abdomen soft, non-tender and normal bowel sounds Musculoskeletal:no cyanosis of digits and no clubbing  PSYCH: alert & oriented x 3 with fluent speech NEURO: no focal motor/sensory deficits Breasts: Breast inspection showed Right  breast is surgically absent status post reconstruction. Palpation of the breasts and axilla revealed no obvious mass that I could appreciate.   LABORATORY DATA:  I have reviewed the data as listed CBC Latest Ref Rng & Units 08/06/2016 07/16/2016 06/26/2016  WBC 3.9 - 10.3 10e3/uL 4.4 5.1 5.0  Hemoglobin 11.6 - 15.9 g/dL 11.9 11.5(L) 12.2  Hematocrit 34.8 - 46.6 % 34.8 33.8(L) 35.5  Platelets 145 - 400 10e3/uL 186 213 236   CMP Latest Ref Rng & Units 08/06/2016 07/16/2016 06/26/2016  Glucose 70 - 140 mg/dl 89 91 122  BUN 7.0 - 26.0 mg/dL 6.8(L) 10.9 10.7  Creatinine 0.6 - 1.1 mg/dL 0.8 0.8 0.9  Sodium 136 - 145 mEq/L 141 138 143  Potassium 3.5 - 5.1 mEq/L 3.7 3.6 3.3(L)  Chloride 101 - 111 mmol/L - - -  CO2 22 - 29 mEq/L _0 Calcium 8.4 - 10.4 mg/dL 9.8 9.4 9.2  Total Protein 6.4 - 8.3 g/dL 7.2 7.0 7.0  Total Bilirubin 0.20 - 1.20 mg/dL 1.49(H) 1.55(H) 1.39(H)  Alkaline Phos 40 - 150 U/L 58 59 58  AST 5 - 34 U/L _1 ALT 0 - 55 U/L _2 PROCEDURES: Echocardiogram 05/21/2016 Study Conclusions  - Left ventricle: GLS is normal at -20.6% The cavity size was   normal. There was mild focal basal hypertrophy of the septum.   Systolic function was normal. The estimated ejection fraction was   55%. There is akinesis of the basalinferior myocardium. - Aortic valve: Trileaflet; normal thickness, mildly calcified   leaflets. There was trivial regurgitation. - Mitral valve: There was trivial regurgitation. - Tricuspid valve: There was mild regurgitation. - Pulmonic valve: There was trivial regurgitation. - Pulmonary arteries: PA peak pressure: 37 mm Hg (S).  Impressions:  - The right ventricular systolic pressure  was increased consistent   with mild pulmonary hypertension.   RADIOGRAPHIC STUDIES: PET 05/20/2016 IMPRESSION: 1. There multiple metastatic foci in the bony pelvis, including some new abnormal foci compare to the prior PET-CT, compatible with mildly  progressive bony metastatic disease. Also, a left T11 vertebral hypermetabolic metastatic lesion is observed, new compared to the prior exam. 2. No active extraosseous malignancy is currently identified. 3. Right anterior fourth rib healing fracture, appears benign.  NM Bone Scan 05/01/2016 IMPRESSION: 1. Small focal uptake involving the anterior rib ends of the left second and right fourth ribs, new since the prior bone scan. The location of this uptake is more suggestive of a traumatic or inflammatory etiology as opposed to metastatic disease. No other evidence of metastatic disease. 2. There are other areas of stable uptake which are most likely degenerative/reactive, including right shoulder and cervical spine uptake and uptake along the right femur adjacent to the ORIF Hardware.  CT CAP 05/01/2016 IMPRESSION: 1. Stable exam.  No new or progressive disease identified. 2. No evidence for residual or recurrent adenopathy within the chest. 3. Stable bone metastasis.   I have personally reviewed the radiological images as listed and agreed with the findings in the report. Xr Femur Min 2 Views Left  Result Date: 07/24/2016 AP lateral femur x-rays demonstrate nail. Endosteal scalloping subtrochanteric region previously where there was tumor present. Cortex is intact no new fracture. Hip joint is normal.  Xr Pelvis 1-2 Views  Result Date: 07/24/2016 AP pelvis shows previous trochanteric nail on the left hip. Some endosteal scalloping in the subtrochanteric region where she had tumor. No evidence of new fracture. Hip joint is normal. Impression post trochanteric nail for pathologic lesion subtrochanteric region. No plain radiograph bone destructive lesions are visualized on plain radiograph particularly in the left ilium and right supra-acetabular region which was positive on PET scan  ASSESSMENT & PLAN:  [name] is a 51 y.o. postmenopause female with:  1. Metastatic  right  breast cancer to bone, ER+ HER 2+ -I previously reviewed her medical records extensively, and confirmed the points with patient, sees the oncology history summary above. -Restaging PET 12-25-15 markedly improved of bone metastasis after initial systemic chemo  -she was switched to letrozole and maintenance Herceptin and the pejeta on 01-16-16.  -Did not tolerate SQ xgeva and it was changed to zometa monthly, will change it to evey 6 weeks to match her infusion  -Echocardiogram 01-27-16 showed normal EF, will continue monitoring every 3 months when she is on herceptin -I reviewed her CT scan and bone scan from 05/01/2016, which showed stable disease overall, a few new uptake of anterior ribs on bone scan are indeterminate. -She has developed worsening left hip pain, and more fatigued lately. I have clinical concern of disease progression. -I previously  discussed her restaging PET scan from 05/20/2016. Unfortunately it showed mild disease progression of her bone metastasis  -I have changed her treatment to second line Kadcyla, she is tolerating very well. She declined other chemo.  -continue letrozole -Labs reviewed, her bilirubin is slightly elevated but stable, adequate to continue treatment. 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  -She is moving out on 08/21/2016. will need PET scan when she gets to [facility]  -Proceed with last treatment in our clinic today (cycle 4)  2. History of MRSA skin infection scalp, boil at low back scalp and upper lip -I prescribed doxycycline on 04/24/16 for 7 days  - Resolved now.  3.congenital  deafness -needs sign language interpreter for all visits.  -Husband also deaf.  4. premature surgical menopause -post BSO 2048fr pelvic pain and ovarian cysts, likely was also beneficial from standpoint of the breast cancer. -hysterectomy.   5. Intermittent anemia  -mild, possible related to prior chemo -She is asymptomatic, we'll continue close  monitoring  6. Hypokalemia  -K 3.1 on 03/05/16, she is on KCL KCL 279m daily, increased to 4054mdaily for a week. -K 3.2 on 04/03/16. The patient is on KCl 40m30maily, increased to 40me54mily for a week and then take 1 pill a day as normal. -K 3.3 on 07/16/16, continue KCL 40meq61mly   7. Goal of care discussion  -We previously discussed the incurable nature of her cancer, and the overall poor prognosis, especially if she does not have good response to chemotherapy or progress on chemo.  -The patient understands the goal of care is palliative. -she is full code for now   8. Left Leg Pain -she initially presented with worsening left leg and hip pain secondary to bone mets, she had surgery before, this was much improved after her prior chemo - The patient previously took Hydrocodone for this pain, which helped. - I refilled Hydrocodone previously on 5/3. She will take 1 tablet every 6 hours as needed for pain. -Pain has reccurred. Recent PET scan showed minimum uptake, unlikely related to bone mets progression  - I encouraged her to call Dr. Yates [name] the pain in her left leg. She has followed-up  With him.  -She requests to have bed for all infusions   9. Left gum/jaw edema  - She has some cavities. This is likely gum infection  -I previously prescribed Augmentin. I strongly encouraged her to find a dentist. -resolved now   10. Insomnia -I give her Ambien 5mg, b79mit did not work well.  -continue ativan 1 mg to take at night as needed  -mirtazapine did not work, she also tried trazodone from PCP which gave her a rash. She stopped taking it.    11. Relocation -[place], however her husband will not be able to move until April 2019 -We discussed the logistics, especially her medical care. She has [employer] [facility] [place] is [place] 16 so I highly suggest she gets an appointment set up ASAP. -I highly encouraged her to stop and walk around every 1-2 hours [place]   12. Goal of care discussion  -We discussed the incurable nature of her cancer, and the overall poor prognosis, especially if she does not have good response to chemotherapy or progress on chemo -The patient understands the goal of care is palliative. -I recommend DNR/DNI, she will think about it    Plan:   --I will [facility] her [facility] [place] [facility] get her an appointment the week of 08/24/2016. She is due for cycle 5 Kadcyla on 8/16. I spoke with our HIM staff Kim todMaudie Mercury  -she will continue letrozole, she has refilled  -Pt requests bed for al infusions  -Pt [place] a sign language interpreter [place] medical records to new physician  -[place]  Pt had many questions. All questions were answered. The patient knows to call the clinic with any problems, questions or concerns.  I spent 30 minutes counseling the patient face to face. The total time spent in the appointment was 40 minutes and more than 50% was on counseling.  This document serves as a record of services personally performed by Truitt Merle, MD. It was created on her behalf by Joslyn Devon, a trained medical scribe. The creation of this record is based on the scribe's personal observations and the provider's statements to them. This document has been checked and approved by the attending provider.     Truitt Merle, MD 08/06/2016

## 2016-08-04 ENCOUNTER — Telehealth: Payer: Self-pay | Admitting: *Deleted

## 2016-08-04 DIAGNOSIS — E876 Hypokalemia: Secondary | ICD-10-CM

## 2016-08-04 MED ORDER — POTASSIUM CHLORIDE ER 10 MEQ PO TBCR: 20.0000 meq | EXTENDED_RELEASE_TABLET | Freq: Every day | ORAL | 1 refills | Status: DC

## 2016-08-04 NOTE — Telephone Encounter (Signed)
Received call from pt through deaf interpreter requesting refill on potassium.  Refill sent to pharmacy.

## 2016-08-06 ENCOUNTER — Telehealth: Payer: Self-pay | Admitting: Hematology

## 2016-08-06 ENCOUNTER — Ambulatory Visit (HOSPITAL_BASED_OUTPATIENT_CLINIC_OR_DEPARTMENT_OTHER): Payer: Medicaid Other

## 2016-08-06 ENCOUNTER — Encounter: Payer: Self-pay | Admitting: Hematology

## 2016-08-06 ENCOUNTER — Ambulatory Visit: Payer: Medicaid Other

## 2016-08-06 ENCOUNTER — Ambulatory Visit (HOSPITAL_BASED_OUTPATIENT_CLINIC_OR_DEPARTMENT_OTHER): Payer: Medicaid Other | Admitting: Hematology

## 2016-08-06 VITALS — BP 128/78 | HR 61 | Temp 97.6°F | Resp 18 | Ht 67.0 in | Wt 127.7 lb

## 2016-08-06 DIAGNOSIS — Z95828 Presence of other vascular implants and grafts: Secondary | ICD-10-CM

## 2016-08-06 DIAGNOSIS — M79605 Pain in left leg: Secondary | ICD-10-CM

## 2016-08-06 DIAGNOSIS — C7951 Secondary malignant neoplasm of bone: Secondary | ICD-10-CM

## 2016-08-06 DIAGNOSIS — R600 Localized edema: Secondary | ICD-10-CM

## 2016-08-06 DIAGNOSIS — D649 Anemia, unspecified: Secondary | ICD-10-CM

## 2016-08-06 DIAGNOSIS — C50911 Malignant neoplasm of unspecified site of right female breast: Secondary | ICD-10-CM

## 2016-08-06 DIAGNOSIS — G4700 Insomnia, unspecified: Secondary | ICD-10-CM | POA: Diagnosis not present

## 2016-08-06 DIAGNOSIS — H905 Unspecified sensorineural hearing loss: Secondary | ICD-10-CM | POA: Diagnosis not present

## 2016-08-06 DIAGNOSIS — C50919 Malignant neoplasm of unspecified site of unspecified female breast: Secondary | ICD-10-CM

## 2016-08-06 DIAGNOSIS — E876 Hypokalemia: Secondary | ICD-10-CM

## 2016-08-06 DIAGNOSIS — H9193 Unspecified hearing loss, bilateral: Secondary | ICD-10-CM

## 2016-08-06 DIAGNOSIS — Z5112 Encounter for antineoplastic immunotherapy: Secondary | ICD-10-CM

## 2016-08-06 DIAGNOSIS — C799 Secondary malignant neoplasm of unspecified site: Principal | ICD-10-CM

## 2016-08-06 LAB — COMPREHENSIVE METABOLIC PANEL
ALBUMIN: 3.6 g/dL (ref 3.5–5.0)
ALK PHOS: 58 U/L (ref 40–150)
ALT: 16 U/L (ref 0–55)
ANION GAP: 10 meq/L (ref 3–11)
AST: 15 U/L (ref 5–34)
BILIRUBIN TOTAL: 1.49 mg/dL — AB (ref 0.20–1.20)
BUN: 6.8 mg/dL — ABNORMAL LOW (ref 7.0–26.0)
CALCIUM: 9.8 mg/dL (ref 8.4–10.4)
CHLORIDE: 105 meq/L (ref 98–109)
CO2: 26 mEq/L (ref 22–29)
CREATININE: 0.8 mg/dL (ref 0.6–1.1)
EGFR: 85 mL/min/{1.73_m2} — ABNORMAL LOW (ref >90)
Glucose: 89 mg/dl (ref 70–140)
Potassium: 3.7 mEq/L (ref 3.5–5.1)
Sodium: 141 mEq/L (ref 136–145)
Total Protein: 7.2 g/dL (ref 6.4–8.3)

## 2016-08-06 LAB — CBC WITH DIFFERENTIAL/PLATELET
BASO%: 0.5 % (ref 0.0–2.0)
BASOS ABS: 0 10*3/uL (ref 0.0–0.1)
EOS%: 5.2 % (ref 0.0–7.0)
Eosinophils Absolute: 0.2 10*3/uL (ref 0.0–0.5)
HEMATOCRIT: 34.8 % (ref 34.8–46.6)
HGB: 11.9 g/dL (ref 11.6–15.9)
LYMPH#: 1.5 10*3/uL (ref 0.9–3.3)
LYMPH%: 35 % (ref 14.0–49.7)
MCH: 29.9 pg (ref 25.1–34.0)
MCHC: 34.2 g/dL (ref 31.5–36.0)
MCV: 87.4 fL (ref 79.5–101.0)
MONO#: 0.4 10*3/uL (ref 0.1–0.9)
MONO%: 8.6 % (ref 0.0–14.0)
NEUT#: 2.2 10*3/uL (ref 1.5–6.5)
NEUT%: 50.7 % (ref 38.4–76.8)
PLATELETS: 186 10*3/uL (ref 145–400)
RBC: 3.98 10*6/uL (ref 3.70–5.45)
RDW: 12.9 % (ref 11.2–14.5)
WBC: 4.4 10*3/uL (ref 3.9–10.3)

## 2016-08-06 MED ORDER — ACETAMINOPHEN 325 MG PO TABS
ORAL_TABLET | ORAL | Status: AC
  Filled 2016-08-06: qty 2

## 2016-08-06 MED ORDER — ACETAMINOPHEN 325 MG PO TABS
650.0000 mg | ORAL_TABLET | Freq: Once | ORAL | Status: AC
  Administered 2016-08-06: 650 mg via ORAL

## 2016-08-06 MED ORDER — ZOLEDRONIC ACID 4 MG/100ML IV SOLN
4.0000 mg | Freq: Once | INTRAVENOUS | Status: AC
  Administered 2016-08-06: 4 mg via INTRAVENOUS
  Filled 2016-08-06: qty 100

## 2016-08-06 MED ORDER — HEPARIN SOD (PORK) LOCK FLUSH 100 UNIT/ML IV SOLN
500.0000 [IU] | Freq: Once | INTRAVENOUS | Status: AC | PRN
  Administered 2016-08-06: 500 [IU]
  Filled 2016-08-06: qty 5

## 2016-08-06 MED ORDER — SODIUM CHLORIDE 0.9 % IV SOLN
Freq: Once | INTRAVENOUS | Status: AC
  Administered 2016-08-06: 11:00:00 via INTRAVENOUS

## 2016-08-06 MED ORDER — SODIUM CHLORIDE 0.9% FLUSH
10.0000 mL | INTRAVENOUS | Status: DC | PRN
  Administered 2016-08-06: 10 mL
  Filled 2016-08-06: qty 10

## 2016-08-06 MED ORDER — SODIUM CHLORIDE 0.9 % IV SOLN
3.4000 mg/kg | Freq: Once | INTRAVENOUS | Status: AC
  Administered 2016-08-06: 200 mg via INTRAVENOUS
  Filled 2016-08-06: qty 10

## 2016-08-06 NOTE — Patient Instructions (Signed)
Implanted Port Home Guide An implanted port is a type of central line that is placed under the skin. Central lines are used to provide IV access when treatment or nutrition needs to be given through a person's veins. Implanted ports are used for long-term IV access. An implanted port may be placed because:  You need IV medicine that would be irritating to the small veins in your hands or arms.  You need long-term IV medicines, such as antibiotics.  You need IV nutrition for a long period.  You need frequent blood draws for lab tests.  You need dialysis.  Implanted ports are usually placed in the chest area, but they can also be placed in the upper arm, the abdomen, or the leg. An implanted port has two main parts:  Reservoir. The reservoir is round and will appear as a small, raised area under your skin. The reservoir is the part where a needle is inserted to give medicines or draw blood.  Catheter. The catheter is a thin, flexible tube that extends from the reservoir. The catheter is placed into a large vein. Medicine that is inserted into the reservoir goes into the catheter and then into the vein.  How will I care for my incision site? Do not get the incision site wet. Bathe or shower as directed by your health care provider. How is my port accessed? Special steps must be taken to access the port:  Before the port is accessed, a numbing cream can be placed on the skin. This helps numb the skin over the port site.  Your health care provider uses a sterile technique to access the port. ? Your health care provider must put on a mask and sterile gloves. ? The skin over your port is cleaned carefully with an antiseptic and allowed to dry. ? The port is gently pinched between sterile gloves, and a needle is inserted into the port.  Only "non-coring" port needles should be used to access the port. Once the port is accessed, a blood return should be checked. This helps ensure that the port  is in the vein and is not clogged.  If your port needs to remain accessed for a constant infusion, a clear (transparent) bandage will be placed over the needle site. The bandage and needle will need to be changed every week, or as directed by your health care provider.  Keep the bandage covering the needle clean and dry. Do not get it wet. Follow your health care provider's instructions on how to take a shower or bath while the port is accessed.  If your port does not need to stay accessed, no bandage is needed over the port.  What is flushing? Flushing helps keep the port from getting clogged. Follow your health care provider's instructions on how and when to flush the port. Ports are usually flushed with saline solution or a medicine called heparin. The need for flushing will depend on how the port is used.  If the port is used for intermittent medicines or blood draws, the port will need to be flushed: ? After medicines have been given. ? After blood has been drawn. ? As part of routine maintenance.  If a constant infusion is running, the port may not need to be flushed.  How long will my port stay implanted? The port can stay in for as long as your health care provider thinks it is needed. When it is time for the port to come out, surgery will be   done to remove it. The procedure is similar to the one performed when the port was put in. When should I seek immediate medical care? When you have an implanted port, you should seek immediate medical care if:  You notice a bad smell coming from the incision site.  You have swelling, redness, or drainage at the incision site.  You have more swelling or pain at the port site or the surrounding area.  You have a fever that is not controlled with medicine.  This information is not intended to replace advice given to you by your health care provider. Make sure you discuss any questions you have with your health care provider. Document  Released: 12/29/2004 Document Revised: 06/06/2015 Document Reviewed: 09/05/2012 Elsevier Interactive Patient Education  2017 [employer]  

## 2016-08-06 NOTE — Telephone Encounter (Signed)
Spoke with Sonia Baller from Dr. Candyce Churn office and gave her pts demographics.  We could not schedule a appt due to pt does not have her [employer] number. Pt's medicaid card was mailed to her friend in [place]. Pt will call her friend in [place] and get her medicaid number and then will call Sonia Baller from Dr. Candyce Churn office to schedule a appt.

## 2016-08-06 NOTE — Patient Instructions (Signed)
[facility] Discharge Instructions for Patients Receiving Chemotherapy  Today you received the following chemotherapy agents Kadcyla and Zometa.  To help prevent nausea and vomiting after your treatment, we encourage you to take your nausea medication as directed.   If you develop nausea and vomiting that is not controlled by your nausea medication, call the clinic.   BELOW ARE SYMPTOMS THAT SHOULD BE REPORTED IMMEDIATELY:  *FEVER GREATER THAN 100.5 F  *CHILLS WITH OR WITHOUT FEVER  NAUSEA AND VOMITING THAT IS NOT CONTROLLED WITH YOUR NAUSEA MEDICATION  *UNUSUAL SHORTNESS OF BREATH  *UNUSUAL BRUISING OR BLEEDING  TENDERNESS IN MOUTH AND THROAT WITH OR WITHOUT PRESENCE OF ULCERS  *URINARY PROBLEMS  *BOWEL PROBLEMS  UNUSUAL RASH Items with * indicate a potential emergency and should be followed up as soon as possible.  Feel free to call the clinic you have any questions or concerns. The clinic phone number is [phone].  Please show the [facility] at check-in to the Emergency Department and triage nurse.

## 2016-08-25 ENCOUNTER — Telehealth: Payer: Self-pay

## 2016-08-25 NOTE — Telephone Encounter (Signed)
Interpreted call with interpreter # [phone].  Pt moved to [place] and needs her chart and official referral information for her new PCP. PCP name is [name], at [facility] in [place]. Fax # [phone]. Phone # [phone].  Her insurance said she needed a PCP first and the PCP will refer her to oncologist. [name] is nearest city with cancer center.  [name]'s new phone number is [phone].  [name]'s new address is [address], [place] [address].  Called PCP office and got HIM fax # [phone] Faxed demographics, lastest ov note, most recent cbc/cmet/ca27.29, path report from 05/16/03, most recent xr, PET, bone scan, CT and IR CV line injection for port. And MAR.   The intake person said it would take a couple of weeks for the practice to set up an interpreter for the pt.

## 2016-08-25 NOTE — Telephone Encounter (Signed)
Please inform her PCP that she is on active cancer treatment (IV) and due for next infusion this Thursday 8/16, to see if they can see her sooner. We have previously referred her to her new local oncologist before she moved.   Thanks.   Truitt Merle MD

## 2016-08-26 ENCOUNTER — Telehealth: Payer: Self-pay

## 2016-08-26 NOTE — Telephone Encounter (Signed)
No entry 

## 2016-10-15 ENCOUNTER — Telehealth: Payer: Self-pay | Admitting: *Deleted

## 2016-10-15 NOTE — Telephone Encounter (Signed)
Sure, I will send a scheduling message.   Truitt Merle MD

## 2016-10-15 NOTE — Telephone Encounter (Signed)
"  [name] with [facility] in [place], [place] [phone]).  Not sure why but this previous patient of Dr. Ernestina Penna is returning to [facility].    Dr. Vonzella Nipple wants her scheduled appointment with Dr. Burr Medico October 24 th, 2018 for lab, F/U and Kadcyla.    Dr. Vonzella Nipple saw her yesterday.  Kadcyla 21-day cycle and Zometa 6-week cycle  were received yesterday.  She also needs letrozole.    I faxed our records yesterday.  The 10-14-2016 note will be faxed when signed off.  Call, ask for me if records were not received.    [name] needs your office to request an interpreter because she is hearing impaired/deaf.  Reported use of a [name] as her interpreter in [place].    She is leaving today or tomorrow to return to [place].    Provided new address, [address], [name], [place]., [name].  She asked our office to mail appointment information due to being hearing impaired.   Thanks for your help on this matter."  Routing call information to collaborative nurse and provider for review.  Further patient communication through collaborative nurse.   Address provided is current with [facility] demographics.

## 2016-10-16 ENCOUNTER — Telehealth: Payer: Self-pay | Admitting: Hematology

## 2016-10-16 NOTE — Telephone Encounter (Signed)
Left voicemail for patient regarding her appts added per 10/4 sch msg. Sending a confirmation letter as well.

## 2016-10-27 ENCOUNTER — Emergency Department (HOSPITAL_COMMUNITY)
Admission: EM | Admit: 2016-10-27 | Discharge: 2016-10-28 | Disposition: A | Discharge status: Home / Self Care | Payer: Medicaid Other | Attending: Emergency Medicine | Admitting: Emergency Medicine

## 2016-10-27 ENCOUNTER — Encounter (HOSPITAL_COMMUNITY): Payer: Self-pay | Admitting: Emergency Medicine

## 2016-10-27 DIAGNOSIS — R1032 Left lower quadrant pain: Secondary | ICD-10-CM | POA: Insufficient documentation

## 2016-10-27 DIAGNOSIS — Z79899 Other long term (current) drug therapy: Secondary | ICD-10-CM | POA: Insufficient documentation

## 2016-10-27 LAB — URINALYSIS, ROUTINE W REFLEX MICROSCOPIC
Bacteria, UA: NONE SEEN
Bilirubin Urine: NEGATIVE
GLUCOSE, UA: NEGATIVE mg/dL
KETONES UR: NEGATIVE mg/dL
Leukocytes, UA: NEGATIVE
Nitrite: NEGATIVE
PH: 5 (ref 5.0–8.0)
Protein, ur: NEGATIVE mg/dL
SPECIFIC GRAVITY, URINE: 1.013 (ref 1.005–1.030)
SQUAMOUS EPITHELIAL / LPF: NONE SEEN

## 2016-10-27 LAB — CBC
HCT: 36.5 % (ref 36.0–46.0)
Hemoglobin: 12.3 g/dL (ref 12.0–15.0)
MCH: 29.1 pg (ref 26.0–34.0)
MCHC: 33.7 g/dL (ref 30.0–36.0)
MCV: 86.5 fL (ref 78.0–100.0)
PLATELETS: 188 10*3/uL (ref 150–400)
RBC: 4.22 MIL/uL (ref 3.87–5.11)
RDW: 13.3 % (ref 11.5–15.5)
WBC: 6.1 10*3/uL (ref 4.0–10.5)

## 2016-10-27 LAB — COMPREHENSIVE METABOLIC PANEL
ALK PHOS: 61 U/L (ref 38–126)
ALT: 49 U/L (ref 14–54)
AST: 47 U/L — AB (ref 15–41)
Albumin: 3.9 g/dL (ref 3.5–5.0)
Anion gap: 6 (ref 5–15)
BUN: 7 mg/dL (ref 6–20)
CALCIUM: 9.6 mg/dL (ref 8.9–10.3)
CHLORIDE: 103 mmol/L (ref 101–111)
CO2: 29 mmol/L (ref 22–32)
CREATININE: 0.74 mg/dL (ref 0.44–1.00)
GFR calc non Af Amer: >60 mL/min (ref >60)
Glucose, Bld: 96 mg/dL (ref 65–99)
Potassium: 3.5 mmol/L (ref 3.5–5.1)
SODIUM: 138 mmol/L (ref 135–145)
Total Bilirubin: 1.3 mg/dL — ABNORMAL HIGH (ref 0.3–1.2)
Total Protein: 7.7 g/dL (ref 6.5–8.1)

## 2016-10-27 LAB — LIPASE, BLOOD: LIPASE: 28 U/L (ref 11–51)

## 2016-10-27 NOTE — ED Notes (Signed)
Contacted Sign language interpreter

## 2016-10-27 NOTE — ED Notes (Signed)
Sign language interpreter, Clarise Cruz should arrive around 2145

## 2016-10-27 NOTE — ED Triage Notes (Addendum)
Patient here with left lower quadrant pain.  She pinpoints it to the left lower quadrant.  Denies any nausea or vomiting.  She states that it is sharp in nature.  She has had it for 3 days, 8/10 pain.  PATIENT IS DEAF AND INTERPRETER HAS BEEN CALLED.

## 2016-10-28 ENCOUNTER — Emergency Department (HOSPITAL_COMMUNITY): Payer: Medicaid Other

## 2016-10-28 MED ORDER — MORPHINE SULFATE (PF) 4 MG/ML IV SOLN
4.0000 mg | Freq: Once | INTRAVENOUS | Status: AC
  Administered 2016-10-28: 4 mg via INTRAVENOUS
  Filled 2016-10-28: qty 1

## 2016-10-28 MED ORDER — MORPHINE SULFATE (PF) 4 MG/ML IV SOLN
4.0000 mg | Freq: Once | INTRAVENOUS | Status: AC
Start: 2016-10-28 — End: 2016-10-28
  Administered 2016-10-28: 4 mg via INTRAVENOUS
  Filled 2016-10-28: qty 1

## 2016-10-28 MED ORDER — IOPAMIDOL (ISOVUE-300) INJECTION 61%
INTRAVENOUS | Status: AC
  Administered 2016-10-28: 100 mL
  Filled 2016-10-28: qty 100

## 2016-10-28 NOTE — ED Notes (Signed)
Returned from CT.

## 2016-10-28 NOTE — ED Provider Notes (Signed)
[facility] EMERGENCY DEPARTMENT Provider Note   CSN: [id number] Arrival date & time: 10/27/16  2005     History   Chief Complaint Chief Complaint  Patient presents with  . Abdominal Pain    HPI [name] is a 51 y.o. female presenting with three-day history of abdominal pain.  History provided by patient through sign language interpreter.  Patient states that 3 days ago, she started to have left lower quadrant abdominal pain. It is sharp and constant. It does not radiate anywhere. She denies history of similar. Nothing makes it better, any movement makes it worse. She states she has taken her at home pain medicine without relief of symptoms. She reports associated nausea, no vomiting. She denies fever, chills, chest pain, shortness of breath, pain also in the abdomen, urinary symptoms, abnormal bowel movements. Previous abdominal surgeries include C-section and tubal ligation. She reports history of breast cancer and bone cancer. She denies h/o abd problems.  HPI  Past Medical History:  Diagnosis Date  . Abnormal Pap smear   . Anxiety   . Breast cancer ([place])   . Cancer ([place]) 1999   breast-s/p mastectomy, chemo, rad  . CIN I (cervical intraepithelial neoplasia I) 2003   by colpo  . Congenital deafness   . Deaf   . Depression   . Radiation 07/12/15-07/26/15   left femur 30 Gy  . S/P bilateral oophorectomy     Patient Active Problem List   Diagnosis Date Noted  . Rash and nonspecific skin eruption 07/31/2016  . Neck pain 07/25/2016  . Goals of care, counseling/discussion 03/05/2016  . MRSA (methicillin resistant Staphylococcus aureus) infection 01/18/2016  . Hypokalemia 01/18/2016  . Folliculitis 72/53/6644  . Blood blister 01/10/2016  . Anemia due to antineoplastic chemotherapy 12/11/2015  . Lymphedema of left leg 12/11/2015  . Osteopenia of multiple sites 11/26/2015  . Drug-induced diarrhea 11/26/2015  . Pain of left hip joint 11/08/2015   . 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 Port catheter in place 09/26/2015  . Chemotherapy induced nausea and vomiting 08/13/2015  . Cancer related pain 08/13/2015  . Portacath in place 08/13/2015  . Drug allergy, multiple 08/13/2015  . [name], right ([place] [place]) 07/14/2015  . Other iron deficiency anemias 07/14/2015  . Claustrophobia 07/14/2015  . Status post laparoscopic cholecystectomy 07/14/2015  . S/P TRAM (transverse rectus abdominis muscle) flap breast reconstruction 07/14/2015  . Thyroid nodule 07/09/2015  . Constipation due to pain medication   . Left hip pain   . Depression   . Generalized anxiety disorder   . Esophageal reflux   . Genetic testing 04/04/2015  . Congenital deafness 02/26/2015  . Noncompliance with therapeutic plan 02/26/2015  . Hx of BSO (bilateral salpingo-oophorectomy) 02/26/2015  . Premature surgical menopause 02/26/2015  . Benign paroxysmal positional vertigo 07/24/2014  . Migraine 07/24/2014  . RLQ abdominal pain 09/30/2013  . Dysuria 09/30/2013  . Hx of breast cancer 04/28/2013  . Colon cancer screening 04/28/2013  . Migraine without aura, with intractable migraine, so stated, without mention of status migrainosus 06/22/2012  . Dizziness 05/14/2011  . Pharyngitis, acute 09/04/2010  . BACK STRAIN, LUMBAR 01/21/2010  . DE QUERVAIN'S TENOSYNOVITIS, LEFT WRIST 10/07/2009  . EMESIS 02/05/2009  . ABDOMINAL PAIN, GENERALIZED 02/05/2009  . DIZZINESS, CHRONIC 01/25/2007  . Insomnia 01/25/2007  . Hearing loss 03/11/2006  . CERVICAL DYSPLASIA 03/11/2006  . MENSTRUATION, PAINFUL 03/11/2006    Past Surgical History:  Procedure Laterality Date  .  ABDOMINAL HYSTERECTOMY    . INTRAMEDULLARY (IM) NAIL INTERTROCHANTERIC Left 06/26/2015   Procedure: LEFT BIOMET LONG AFFIXS NAIL;  Surgeon: [name], MD;  Location: [facility];  Service: Orthopedics;  Laterality: Left;  . IR GENERIC HISTORICAL  08/06/2015   IR  US GUIDE VASC ACCESS LEFT 08/06/2015 Aletta Edouard, MD [facility]-INTERV RAD  . IR GENERIC HISTORICAL  08/06/2015   IR FLUORO GUIDE CV LINE LEFT 08/06/2015 Aletta Edouard, MD [facility]  . IR GENERIC HISTORICAL  03/05/2016   IR CV LINE INJECTION 03/05/2016 Markus Daft, MD [facility]  . MASTECTOMY     right breast  . MASTECTOMY    . OVARIAN CYST REMOVAL    . RADIOLOGY WITH ANESTHESIA Left 06/25/2015   Procedure: MRI OF LEFT HIP WITH OR WITHOUT CONTRAST;  Surgeon: Medication Radiologist, MD;  Location: [facility];  Service: Radiology;  Laterality: Left;  DR. MCINTYRE/MRI  . TUBAL LIGATION      OB History    Gravida Para Term Preterm AB Living   2 2 1 1  0 2   SAB TAB Ectopic Multiple Live Births   0 0 0 0         Home Medications    Prior to Admission medications   Medication Sig Start Date End Date Taking? Authorizing Provider  Cholecalciferol 2000 units CAPS Take 1 capsule by mouth daily.    [provider]  divalproex (DEPAKOTE) 250 MG DR tablet Take 1 tablet (250 mg total) by mouth at bedtime. 06/04/16   Truitt Merle, MD  HYDROcodone-acetaminophen (NORCO) 5-325 MG tablet Take 1 tablet by mouth every 6 (six) hours as needed for moderate pain. 07/27/16   Truitt Merle, MD  letrozole [facility]) 2.5 MG tablet Take 1 tablet (2.5 mg total) by mouth daily. 07/13/16   Truitt Merle, MD  lidocaine-prilocaine (EMLA) cream Apply 1 application topically as needed. Apply to Porta-Cath 1-2 hours prior to access as directed. 04/03/16   Truitt Merle, MD  loperamide (IMODIUM A-D) 2 MG tablet Take 1-2 tablets after each loose stool. UP TO 6 IN  24 HRS. Patient not taking: Reported on 08/06/2016 09/06/15   Gordy Levan, MD  LORazepam (ATIVAN) 1 MG tablet Place one tablet under the tongue or swallow every 4-6 hours as needed for nausea. (THIS IS THE SAME STRENGTH AS 2 OF YOUR 0.5 MG TABLETS) Patient not taking: Reported on 08/06/2016 09/06/15   Gordy Levan, MD  ondansetron (ZOFRAN) 8 MG tablet Take 1 tablet (8 mg  total) by mouth every 8 (eight) hours as needed for nausea or vomiting. Patient not taking: Reported on 08/06/2016 08/29/15   Gordy Levan, MD  potassium chloride (K-DUR) 10 MEQ tablet Take 2 tablets (20 mEq total) by mouth daily. Patient taking differently: Take 10 mEq by mouth daily.  08/04/16   Truitt Merle, MD  prochlorperazine (COMPAZINE) 10 MG tablet Take 1 tablet (10 mg total) by mouth every 6 (six) hours as needed for nausea or vomiting. Patient not taking: Reported on 08/06/2016 06/26/16   Truitt Merle, MD    Family History Family History  Problem Relation Age of Onset  . Hypertension Mother   . Seizures Mother   . Alzheimer's disease Maternal Uncle   . Pancreatitis Maternal Grandmother   . Heart attack Maternal Grandfather     Social History Social History  Substance Use Topics  . Smoking status: Never Smoker  . Smokeless tobacco: Never Used  . Alcohol use No     Allergies  Trazodone and nefazodone; Chlorhexidine; Promethazine hcl; and Diphenhydramine hcl   Review of Systems Review of Systems  Constitutional: Negative for chills and fever.  HENT: Negative for congestion and sore throat.   Eyes: Negative for visual disturbance.  Respiratory: Negative for cough, chest tightness and shortness of breath.   Cardiovascular: Negative for chest pain.  Gastrointestinal: Positive for abdominal pain and nausea. Negative for constipation, diarrhea and vomiting.  Genitourinary: Negative for dysuria, frequency and hematuria.  Musculoskeletal: Negative for back pain.  Allergic/Immunologic: Positive for immunocompromised state.  Neurological: Negative for dizziness and headaches.  Hematological: Does not bruise/bleed easily.     Physical Exam Updated Vital Signs BP (!) 144/75 (BP Location: Right Arm)   Pulse 65   Temp 98.1 F (36.7 C) (Oral)   Resp 18   Ht 5\' 5"  (1.651 m)   Wt 59.9 kg (132 lb)   SpO2 99%   BMI 21.97 kg/m   Physical Exam  Constitutional: She is  oriented to person, place, and time. She appears well-developed and well-nourished. No distress.  HENT:  Head: Normocephalic and atraumatic.  Eyes: EOM are normal.  Neck: Normal range of motion.  Cardiovascular: Normal rate, regular rhythm and intact distal pulses.   Pulmonary/Chest: Effort normal and breath sounds normal. No respiratory distress. She has no wheezes.  Abdominal: Soft. Bowel sounds are normal. She exhibits no distension. There is no tenderness. There is no rigidity, no rebound and no guarding.    TTP of LLQ. No rebound. No obvious distention or guarding.   Musculoskeletal: Normal range of motion.  Neurological: She is alert and oriented to person, place, and time.  Skin: Skin is warm and dry.  Psychiatric: She has a normal mood and affect.  Nursing note and vitals reviewed.    ED Treatments / Results  Labs (all labs ordered are listed, but only abnormal results are displayed) Labs Reviewed  COMPREHENSIVE METABOLIC PANEL - Abnormal; Notable for the following:       Result Value   AST 47 (*)    Total Bilirubin 1.3 (*)    All other components within normal limits  URINALYSIS, ROUTINE W REFLEX MICROSCOPIC - Abnormal; Notable for the following:    Hgb urine dipstick SMALL (*)    All other components within normal limits  LIPASE, BLOOD  CBC    EKG  EKG Interpretation None       Radiology Ct Abdomen Pelvis W Contrast  Result Date: 10/28/2016 CLINICAL DATA:  Sharp left lower quadrant pain for 3 days. Nausea and fever. EXAM: CT ABDOMEN AND PELVIS WITH CONTRAST TECHNIQUE: Multidetector CT imaging of the abdomen and pelvis was performed using the standard protocol following bolus administration of intravenous contrast. CONTRAST:  183mL ISOVUE-300 IOPAMIDOL (ISOVUE-300) INJECTION 61% COMPARISON:  05/01/2016 FINDINGS: Lower chest: Atelectasis in the lung bases. Hepatobiliary: No focal liver abnormality is seen. Status post cholecystectomy. No biliary dilatation.  Pancreas: Unremarkable. No pancreatic ductal dilatation or surrounding inflammatory changes. Spleen: Normal in size without focal abnormality. Adrenals/Urinary Tract: Adrenal glands are unremarkable. Kidneys are normal, without renal calculi, focal lesion, or hydronephrosis. Bladder is unremarkable. Stomach/Bowel: Stomach is within normal limits. Appendix is not identified. No evidence of bowel wall thickening, distention, or inflammatory changes. Vascular/Lymphatic: No significant vascular findings are present. No enlarged abdominal or pelvic lymph nodes. Reproductive: Uterus and bilateral adnexa are unremarkable. Other: No abdominal wall hernia or abnormality. No abdominopelvic ascites. Surgical clips in the anterior abdominal wall. Musculoskeletal: Internal fixation of the left proximal femur. No  destructive bone lesions. IMPRESSION: No acute process demonstrated in the abdomen or pelvis. No evidence of bowel obstruction or inflammation. Electronically Signed   By: Lucienne Capers M.D.   On: 10/28/2016 01:14    Procedures Procedures (including critical care time)  Medications Ordered in ED Medications  morphine 4 MG/ML injection 4 mg (4 mg Intravenous Given 10/28/16 0027)  iopamidol (ISOVUE-300) 61 % injection (100 mLs  Contrast Given 10/28/16 0050)  morphine 4 MG/ML injection 4 mg (4 mg Intravenous Given 10/28/16 0140)     Initial Impression / Assessment and Plan / ED Course  I have reviewed the triage vital signs and the nursing notes.  Pertinent labs & imaging results that were available during my care of the patient were reviewed by me and considered in my medical decision making (see chart for details).     Patient presenting with three-day history of left lower quadrant pain. Physical exam shows tenderness to palpation of left lower quadrant, no rebound, rigidity, or distention. Basic labs reassuring. Urine without signs of UTI. Will give patient an injection of morphine and order CT  scan for further evaluation.  Pain improved with morphine injection. CT scan without acute findings. Doubt acute intra-abdominal or surgical process. Discussed findings with patient. Discussed that source of her pain is not identified, however does not appear to be emergent. Case discussed with attending,a nd Dr. Betsey Holiday agrees to plan. Patient reports pain has returned, and would like another dose of medicine. Will give dose of morphine and have patient discharge to follow-up with oncologist as scheduled. At this time, patient appears safe for discharge. Return precautions given. Patient states she understands and agrees to plan.   Final Clinical Impressions(s) / ED Diagnoses   Final diagnoses:  LLQ abdominal pain    New Prescriptions New Prescriptions   No medications on file     Franchot Heidelberg, [name] 10/28/16 0144    Orpah Greek, MD 10/28/16 [phone]

## 2016-10-28 NOTE — ED Notes (Signed)
Patient transported to CT 

## 2016-10-28 NOTE — Discharge Instructions (Signed)
Continue to take your at-home medications as prescribed. Make sure that you stay well hydrated, try and drink plenty of water. Follow up with your cancer doctor as scheduled.  Return to the ER if you develop blood in your stool, abdominal distention, persistent vomiting, or any new or worsening symptoms.

## 2016-11-04 ENCOUNTER — Ambulatory Visit (HOSPITAL_COMMUNITY)
Admission: RE | Admit: 2016-11-04 | Discharge: 2016-11-04 | Disposition: A | Discharge status: Home / Self Care | Payer: Medicaid Other | Source: Ambulatory Visit | Attending: Hematology | Admitting: Hematology

## 2016-11-04 ENCOUNTER — Ambulatory Visit (HOSPITAL_BASED_OUTPATIENT_CLINIC_OR_DEPARTMENT_OTHER): Payer: Medicaid Other

## 2016-11-04 ENCOUNTER — Ambulatory Visit (HOSPITAL_BASED_OUTPATIENT_CLINIC_OR_DEPARTMENT_OTHER): Payer: Medicaid Other | Admitting: Nurse Practitioner

## 2016-11-04 ENCOUNTER — Telehealth: Payer: Self-pay | Admitting: Nurse Practitioner

## 2016-11-04 ENCOUNTER — Other Ambulatory Visit (HOSPITAL_BASED_OUTPATIENT_CLINIC_OR_DEPARTMENT_OTHER): Payer: Medicaid Other

## 2016-11-04 ENCOUNTER — Ambulatory Visit: Payer: Medicaid Other

## 2016-11-04 VITALS — BP 128/70 | HR 65 | Temp 97.4°F | Resp 18 | Ht 65.0 in | Wt 132.9 lb

## 2016-11-04 DIAGNOSIS — C50911 Malignant neoplasm of unspecified site of right female breast: Secondary | ICD-10-CM

## 2016-11-04 DIAGNOSIS — Z170 Estrogen receptor positive status [ER+]: Secondary | ICD-10-CM | POA: Diagnosis not present

## 2016-11-04 DIAGNOSIS — Z95828 Presence of other vascular implants and grafts: Secondary | ICD-10-CM

## 2016-11-04 DIAGNOSIS — Z79811 Long term (current) use of aromatase inhibitors: Secondary | ICD-10-CM | POA: Diagnosis not present

## 2016-11-04 DIAGNOSIS — M25552 Pain in left hip: Secondary | ICD-10-CM

## 2016-11-04 DIAGNOSIS — C50919 Malignant neoplasm of unspecified site of unspecified female breast: Secondary | ICD-10-CM

## 2016-11-04 DIAGNOSIS — C7951 Secondary malignant neoplasm of bone: Secondary | ICD-10-CM

## 2016-11-04 DIAGNOSIS — R6884 Jaw pain: Secondary | ICD-10-CM

## 2016-11-04 DIAGNOSIS — M79672 Pain in left foot: Secondary | ICD-10-CM | POA: Insufficient documentation

## 2016-11-04 DIAGNOSIS — E876 Hypokalemia: Secondary | ICD-10-CM | POA: Diagnosis not present

## 2016-11-04 DIAGNOSIS — Z5112 Encounter for antineoplastic immunotherapy: Secondary | ICD-10-CM | POA: Diagnosis present

## 2016-11-04 DIAGNOSIS — R600 Localized edema: Secondary | ICD-10-CM

## 2016-11-04 DIAGNOSIS — G4700 Insomnia, unspecified: Secondary | ICD-10-CM | POA: Diagnosis not present

## 2016-11-04 DIAGNOSIS — C799 Secondary malignant neoplasm of unspecified site: Principal | ICD-10-CM

## 2016-11-04 DIAGNOSIS — M79605 Pain in left leg: Secondary | ICD-10-CM | POA: Diagnosis not present

## 2016-11-04 LAB — CBC WITH DIFFERENTIAL/PLATELET
BASO%: 0.8 % (ref 0.0–2.0)
BASOS ABS: 0 10*3/uL (ref 0.0–0.1)
EOS ABS: 0.1 10*3/uL (ref 0.0–0.5)
EOS%: 2.4 % (ref 0.0–7.0)
HEMATOCRIT: 36.8 % (ref 34.8–46.6)
HEMOGLOBIN: 12.5 g/dL (ref 11.6–15.9)
LYMPH#: 1.5 10*3/uL (ref 0.9–3.3)
LYMPH%: 35.4 % (ref 14.0–49.7)
MCH: 29.3 pg (ref 25.1–34.0)
MCHC: 34 g/dL (ref 31.5–36.0)
MCV: 86.3 fL (ref 79.5–101.0)
MONO#: 0.4 10*3/uL (ref 0.1–0.9)
MONO%: 10.8 % (ref 0.0–14.0)
NEUT#: 2.1 10*3/uL (ref 1.5–6.5)
NEUT%: 50.6 % (ref 38.4–76.8)
PLATELETS: 183 10*3/uL (ref 145–400)
RBC: 4.26 10*6/uL (ref 3.70–5.45)
RDW: 13.3 % (ref 11.2–14.5)
WBC: 4.2 10*3/uL (ref 3.9–10.3)

## 2016-11-04 LAB — COMPREHENSIVE METABOLIC PANEL
ALT: 39 U/L (ref 0–55)
AST: 38 U/L — ABNORMAL HIGH (ref 5–34)
Albumin: 3.7 g/dL (ref 3.5–5.0)
Alkaline Phosphatase: 63 U/L (ref 40–150)
Anion Gap: 10 mEq/L (ref 3–11)
BILIRUBIN TOTAL: 1.2 mg/dL (ref 0.20–1.20)
BUN: 7.1 mg/dL (ref 7.0–26.0)
CHLORIDE: 105 meq/L (ref 98–109)
CO2: 27 meq/L (ref 22–29)
Calcium: 9.7 mg/dL (ref 8.4–10.4)
Creatinine: 0.8 mg/dL (ref 0.6–1.1)
GLUCOSE: 63 mg/dL — AB (ref 70–140)
Potassium: 3.5 mEq/L (ref 3.5–5.1)
SODIUM: 142 meq/L (ref 136–145)
TOTAL PROTEIN: 7.7 g/dL (ref 6.4–8.3)

## 2016-11-04 MED ORDER — SODIUM CHLORIDE 0.9 % IV SOLN
Freq: Once | INTRAVENOUS | Status: AC
  Administered 2016-11-04: 12:00:00 via INTRAVENOUS

## 2016-11-04 MED ORDER — POTASSIUM CHLORIDE CRYS ER 20 MEQ PO TBCR: 20.0000 meq | EXTENDED_RELEASE_TABLET | Freq: Every day | ORAL | 1 refills | Status: DC

## 2016-11-04 MED ORDER — SODIUM CHLORIDE 0.9% FLUSH
10.0000 mL | INTRAVENOUS | Status: DC | PRN
  Administered 2016-11-04: 10 mL
  Filled 2016-11-04: qty 10

## 2016-11-04 MED ORDER — SODIUM CHLORIDE 0.9 % IJ SOLN
10.0000 mL | INTRAMUSCULAR | Status: DC | PRN
  Administered 2016-11-04: 10 mL via INTRAVENOUS
  Filled 2016-11-04: qty 10

## 2016-11-04 MED ORDER — ALTEPLASE 2 MG IJ SOLR
2.0000 mg | Freq: Once | INTRAMUSCULAR | Status: AC | PRN
  Administered 2016-11-04: 2 mg
  Filled 2016-11-04: qty 2

## 2016-11-04 MED ORDER — ZOLEDRONIC ACID 4 MG/5ML IV CONC: 4.0000 mg | Freq: Once | INTRAVENOUS | Status: DC

## 2016-11-04 MED ORDER — ACETAMINOPHEN 325 MG PO TABS
ORAL_TABLET | ORAL | Status: AC
  Filled 2016-11-04: qty 2

## 2016-11-04 MED ORDER — DIVALPROEX SODIUM 250 MG PO DR TAB
250.0000 mg | DELAYED_RELEASE_TABLET | Freq: Every day | ORAL | 2 refills | Status: DC
Start: 2016-11-04 — End: 2016-12-16

## 2016-11-04 MED ORDER — SODIUM CHLORIDE 0.9 % IV SOLN
3.4000 mg/kg | Freq: Once | INTRAVENOUS | Status: AC
  Administered 2016-11-04: 200 mg via INTRAVENOUS
  Filled 2016-11-04: qty 10

## 2016-11-04 MED ORDER — HEPARIN SOD (PORK) LOCK FLUSH 100 UNIT/ML IV SOLN
500.0000 [IU] | Freq: Once | INTRAVENOUS | Status: AC | PRN
  Administered 2016-11-04: 500 [IU]
  Filled 2016-11-04: qty 5

## 2016-11-04 MED ORDER — ACETAMINOPHEN 325 MG PO TABS
650.0000 mg | ORAL_TABLET | Freq: Once | ORAL | Status: AC
  Administered 2016-11-04: 650 mg via ORAL

## 2016-11-04 MED ORDER — DIAZEPAM 5 MG PO TABS: 5.0000 mg | ORAL_TABLET | Freq: Every evening | ORAL | 0 refills | Status: DC | PRN

## 2016-11-04 NOTE — Progress Notes (Signed)
Confirmed w/ [facility] in [place], [place] [phone]) that pt did receive Zometa 4 mg IV there on 10/14/16. Kennith Center, Pharm.D., CPP 11/04/2016@12 :49 PM

## 2016-11-04 NOTE — Progress Notes (Addendum)
[place]  Telephone:([phone]) [phone] Fax:(336) [phone]  Clinic Follow up Note   Patient Care Team: [name], Pete Pelt, MD as PCP - General (Family Medicine) [name], MD as Consulting Physician (Orthopedic Surgery) 11/11/2016  SUMMARY OF ONCOLOGIC HISTORY:   [name], right [facility])   11/1997 Cancer Diagnosis    Patient had 1.4 cm poorly differentiated right breast carcinoma diagnosed in Nov 1999 at age 51, 1/7 axillary nodes involved, ER/PR and HER 2 positive. She had mastectomy with the 7 axillary node evaluation, 4 cycles of adriamycin/ cytoxan followed by taxotere, then five years of tamoxifen thru June 2005 (no herceptin in 1999). She had bilateral oophorectomy in May 2005. She was briefly on aromatase inhibitor after tamoxifen, but discontinued this herself due to poor tolerance.      04/04/2015 Genetic Testing    Negative genetic testing on the breast/ovarian cancer pnael.  The Breast/Ovarian gene panel offered by [facility] includes sequencing and rearrangement analysis for the following 20 genes:  ATM, BARD1, BRCA1, BRCA2, BRIP1, CDH1, CHEK2, EPCAM, FANCC, MLH1, MSH2, MSH6, NBN, PALB2, PMS2, PTEN, RAD51C, RAD51D, TP53, and XRCC2.      06/26/2015 Pathology Results    Initial Path Report Bone, curettage, Left femur met, intramedullary subtroch tissue - METASTATIC ADENOCARCINOMA.  Estrogen Receptor: 95%, POSITIVE, STRONG STAINING INTENSITY Progesterone Receptor: 2%, POSITIVE, STRONG  HER2 (+), IHC 3+      06/26/2015 Surgery    Biopsy of metastatic tissue and stabilization with Affixus trochanteric nail, proximal and distal interlock of left femur by Dr. [name]       06/28/2015 Imaging    CT Chest w/ Contrast 1. Extensive right internal mammary chain lymphadenopathy consistent with metastatic breast cancer. 2. Lytic metastases involving the posterior elements at T1 with probable nondisplaced pathologic fracture of the spinous  process. 3. No other evidence of thoracic metastatic disease. 4. Trace bilateral pleural effusions with associated bibasilar atelectasis. 5. Indeterminate right thyroid nodule, not previously imaged. This could be evaluated with thyroid ultrasound as clinically warranted.      07/01/2015 Imaging    Bone Scan 1. Increased activity noted throughout the left femur. Although metastatic disease cannot be excluded, these changes are most likely secondary to prior surgery. 2. No other focal abnormalities identified to suggest metastatic disease.      07/12/2015 - 07/26/2015 Radiation Therapy    Left femur, 30 Gy in 10 fractions by Dr. Sondra Come       07/14/2015 Initial Diagnosis    [name], right ([place])     07/19/2015 Imaging    Initial PET Scan 1. Severe multifocal osseous metastatic disease, much greater than anticipated based on prior imaging. 2. Multifocal nodal metastases involving the right internal mammary, prevascular and subpectoral lymph nodes. No axillary pulmonary involvement identified. 3. No distant extra osseous metastases.      07/30/2015 - 11/29/2015 Chemotherapy    Docetaxel, Herceptin and Perjeta every 3 weeks X6 cycles, pt tolerated moderately well, restaging scan showed excellent response       09/18/2015 Imaging    CT Head w/wo Contrast 1. No acute intracranial abnormality or significant interval change. 2. Stable minimal periventricular white matter hypoattenuation on the right. This may reflect a remote ischemic injury. 3. No evidence for metastatic disease to the brain. 4. No focal soft tissue lesion to explain the patient's tenderness.      10/30/2015 Imaging    CT Abdomen Pelvis w/ Contrast 1. Few mildly  prominent fluid-filled loops of small bowel scattered throughout the abdomen, with intraluminal fluid density within the distal colon. Given the provided history, findings are suggestive of acute enteritis/diarrheal illness. 2. No other  acute intra-abdominal or pelvic process identified. 3. Widespread osseous metastatic disease, better evaluated on most recent PET-CT from 07/19/2015. No pathologic fracture or other complication. 4. 11 mm hypodensity within the left kidney. This lesion measures intermediate density, and is indeterminate. While this lesion is similar in size relative to recent studies, this is increased in size relative to prior study from 2012. Further evaluation with dedicated renal mass protocol CT and/or MRI is recommended for complete characterization.      12/25/2015 Imaging    Restaging PET Scan 1. Markedly improved skeletal activity, with only several faint foci of residual accentuated metabolic activity, but resolution of the vast majority of the previously extensive osseous metastatic disease. 2. Resolution of the prior right internal mammary and right prevascular lymph nodes. 3. Residual subcutaneous edema and edema along fascia planes in the left upper thigh. This remains somewhat more than I would expect for placement of an IM nail 6 weeks ago. If there is leg swelling further down, consider Doppler venous ultrasound to rule out left lower extremity DVT. I do not perceive an obvious difference in density between the pelvic and common femoral veins on the noncontrast CT data. 4. Chronic right maxillary and right sphenoid sinusitis.      12/2015 -  Chemotherapy    Maintenance Herceptin and pejeta every 3 weeks      01/16/2016 -  Anti-estrogen oral therapy    Letrozole 2.5 mg daily      03/01/2016 Miscellaneous    Patient presented to ED following a fall; she reports she landed on her back and is now experiencing back pain. The patient was evaluated and discharge home same day with pain control.      05/01/2016 Imaging    CT CAP IMPRESSION: 1. Stable exam.  No new or progressive disease identified. 2. No evidence for residual or recurrent adenopathy within the chest. 3. Stable bone metastasis.       05/01/2016 Imaging    BONE SCAN IMPRESSION: 1. Small focal uptake involving the anterior rib ends of the left second and right fourth ribs, new since the prior bone scan. The location of this uptake is more suggestive of a traumatic or inflammatory etiology as opposed to metastatic disease. No other evidence of metastatic disease. 2. There are other areas of stable uptake which are most likely degenerative/reactive, including right shoulder and cervical spine uptake and uptake along the right femur adjacent to the ORIF hardware.      05/20/2016 Imaging    NM PET Skull to Thigh IMPRESSION: 1. There multiple metastatic foci in the bony pelvis, including some new abnormal foci compare to the prior PET-CT, compatible with mildly progressive bony metastatic disease. Also, a left T11 vertebral hypermetabolic metastatic lesion is observed, new compared to the prior exam. 2. No active extraosseous malignancy is currently identified. 3. Right anterior fourth rib healing fracture, appears be      06/04/2016 -  Chemotherapy    Second line chemotherapy Kadcyla every 3 weeks started on 06/04/16       10/05/2016 Imaging    CT CAP and Bone Scan:  Bones: left intramedullary rod and left femoral head neck screw in place. In the proximal diaphysis of the left femur there is cortical thickening and irregularity. No lytic or blastic bone lesions. No  fracture or vertebral endplate destruction.   No definite evidence of recurrent disease in the chest, abdomen, or pelvis.      10/28/2016 Imaging    CT A/P IMPRESSION: No acute process demonstrated in the abdomen or pelvis. No evidence of bowel obstruction or inflammation.      11/04/2016 Imaging    DG foot complete left: IMPRESSION: No fracture or dislocation.  No soft tissue abnormality     CURRENT THERAPY:  Letrozole 2.24m daily, second line Kadcyla every 3 weeks started on 06/04/16; zometa monthly  INTERVAL HISTORY: Mrs. EElie Goody returns for follow-up today. [place] 2 doses of Kadcyla and Zometa on 08/27/2016 and 10/14/2016 [place] in [place] Today she is reporting 8-9/10 constant left hip pain, began 3 weeks ago that is sharp and stabbing. She has been using Norco 2 tabs every night at bedtime.She recently sustained a fall prior to feeling off balance, she has noted vertigo, she denies dizziness or lightheadedness. She has been out of her vertigo medication recently. She has intermittent tingling in her toes, no numbness. She is having difficulty sleeping, has tried trazodone, Ambien, Remeron without success. She is feeling some depression and moodiness related to lack of sleep and uncontrolled pain.  REVIEW OF SYSTEMS:   Constitutional: Denies fatigue, fevers, chills or abnormal weight loss (+) good appetite Eyes: Denies blurriness of vision Ears, nose, mouth, throat, and face: Denies mucositis or sore throat Respiratory: Denies cough, dyspnea or wheezes Cardiovascular: Denies palpitation, chest discomfort (+) trace left leg swelling secondary to orthopedic femur surgery Gastrointestinal:  Denies nausea, vomiting, constipation, diarrhea, heartburn or change in bowel habits Skin: Denies abnormal skin rashes Lymphatics: Denies new lymphadenopathy, easy bruising, or bleeding Neurological:Denies numbness or new weaknesses (+) intermittent tingling to her toes, stable (+) recent fall (+) vertigo, impaired balance Behavioral/Psych: (+) depressed mood (+) moodiness related to lack of sleep and poorly controlled pain Breast: denies changes or pain in her breast MSK: (+) left hip pain began 3 weeks ago (+) left foot pain began 2 months ago, status post fall, no x-ray All other systems were reviewed with the patient and are negative.  MEDICAL HISTORY:  Past Medical History:  Diagnosis Date  . Abnormal Pap smear   . Anxiety   . Breast cancer [place]   .  [place]   breast-s/p mastectomy, chemo, rad  . CIN I (cervical intraepithelial neoplasia I) 2003   by colpo  . Congenital deafness   . Deaf   . Depression   . Radiation 07/12/15-07/26/15   left femur 30 Gy  . S/P bilateral oophorectomy     SURGICAL HISTORY: Past Surgical History:  Procedure Laterality Date  . ABDOMINAL HYSTERECTOMY    . INTRAMEDULLARY (IM) NAIL INTERTROCHANTERIC Left 06/26/2015   Procedure: LEFT BIOMET LONG AFFIXS NAIL;  Surgeon: MMarybelle Killings MD;  [facility]  Service: Orthopedics;  Laterality: Left;  . IR GENERIC HISTORICAL  08/06/2015   IR [place] V[place] ACCESS LEFT 08/06/2015 GAletta Edouard MD [facility]-INTERV RAD  . IR GENERIC HISTORICAL  08/06/2015   IR FLUORO GUIDE CV LINE LEFT 08/06/2015 GAletta Edouard MD [facility]INTER[facility]  . IR GENERIC HISTORICAL  03/05/2016   IR CV LINE INJECTION 03/05/2016 AMarkus Daft MD [facility]INTER[facility]  . MASTECTOMY     right breast  . MASTECTOMY    . OVARIAN CYST REMOVAL    . RADIOLOGY WITH ANESTHESIA Left 06/25/2015   Procedure: MRI  OF LEFT HIP WITH OR WITHOUT CONTRAST;  Surgeon: Medication Radiologist, MD;  Location: [facility];  Service: Radiology;  Laterality: Left;  DR. MCINTYRE/MRI  . TUBAL LIGATION      I have reviewed the social history and family history with the patient and they are unchanged from previous note.  ALLERGIES:  is allergic to trazodone and nefazodone; chlorhexidine; promethazine hcl; and diphenhydramine hcl.  MEDICATIONS:  Current Outpatient Prescriptions  Medication Sig Dispense Refill  . Cholecalciferol 2000 units CAPS Take 1 capsule by mouth daily.    . divalproex (DEPAKOTE) 250 MG DR tablet Take 1 tablet (250 mg total) by mouth at bedtime. 30 tablet 2  . HYDROcodone-acetaminophen (NORCO) 5-325 MG tablet Take 1 tablet by mouth every 6 (six) hours as needed for moderate pain. 30 tablet 0  . letrozole (FEMARA) 2.5 MG tablet Take 1 tablet (2.5 mg total) by mouth daily. 30 tablet 2  . lidocaine-prilocaine (EMLA)  cream Apply 1 application topically as needed. Apply to Porta-Cath 1-2 hours prior to access as directed. 30 g 1  . diazepam (VALIUM) 5 MG tablet Take 1 tablet (5 mg total) by mouth at bedtime as needed for anxiety. (Patient taking differently: Take 5 mg by mouth at bedtime. ) 15 tablet 0  . LORazepam (ATIVAN) 1 MG tablet Place one tablet under the tongue or swallow every 4-6 hours as needed for nausea. (THIS IS THE SAME STRENGTH AS 2 OF YOUR 0.5 MG TABLETS) 30 tablet 0  . ondansetron (ZOFRAN) 8 MG tablet Take 1 tablet (8 mg total) by mouth every 8 (eight) hours as needed for nausea or vomiting. 30 tablet 0  . potassium chloride SA (K-DUR,KLOR-CON) 20 MEQ tablet Take 1 tablet (20 mEq total) by mouth daily. 30 tablet 1  . prochlorperazine (COMPAZINE) 10 MG tablet Take 1 tablet (10 mg total) by mouth every 6 (six) hours as needed for nausea or vomiting. 30 tablet 2   No current facility-administered medications for this visit.    Facility-Administered Medications Ordered in Other Visits  Medication Dose Route Frequency Provider Last Rate Last Dose  . sodium chloride 0.9 % 1,000 mL with potassium chloride 10 mEq infusion   Intravenous Continuous Gordy Levan, MD   Stopped at 09/10/15 1653  . sodium chloride 0.9 % injection 10 mL  10 mL Intravenous PRN Livesay, Lennis P, MD        PHYSICAL EXAMINATION: ECOG PERFORMANCE STATUS: 2 - Symptomatic, <50% confined to bed  Vitals:   11/04/16 1059  BP: 128/70  Pulse: 65  Resp: 18  Temp: (!) 97.4 F (36.3 C)  SpO2: 100%   Filed Weights   11/04/16 1059  Weight: 132 lb 14.4 oz (60.3 kg)    GENERAL:alert, no distress and comfortable SKIN: skin color, texture, turgor are normal, no rashes or significant lesions EYES: normal, Conjunctiva are pink and non-injected, sclera clear OROPHARYNX:no exudate, no erythema and lips, buccal mucosa, and tongue normal  NECK: supple, thyroid normal size, non-tender, without nodularity LYMPH:  no palpable  cervical, supraclavicular, axillary, or inguinal lymphadenopathy  LUNGS: clear to auscultation bilaterally with normal breathing effort HEART: regular rate & rhythm and no murmurs and no lower extremity edema ABDOMEN:abdomen soft, non-tender and normal bowel sounds. No palpable hepatomegaly or masses Musculoskeletal:no cyanosis of digits and no clubbing. No spinal tenderness. Pain on palpation to left medial hip, no erythema or obvious abnormalities (+) pain on palpation to left outer foot with prominent bony structure, limited internal rotation  NEURO: alert &  oriented x 3 with fluent speech, no focal motor/sensory deficits, normal gait Breasts: breast inspection reveals surgically absent right breast s/p reconstruction; palpation of the breasts and axilla reveal no obvious mass that I could appreciate.   LABORATORY DATA:  I have reviewed the data as listed CBC Latest Ref Rng & Units 11/05/2016 11/04/2016 10/27/2016  WBC 4.0 - 10.5 K/uL 5.9 4.2 6.1  Hemoglobin 12.0 - 15.0 g/dL 12.0 12.5 12.3  Hematocrit 36.0 - 46.0 % 35.2(L) 36.8 36.5  Platelets 150 - 400 K/uL 165 183 188     CMP Latest Ref Rng & Units 11/05/2016 11/04/2016 10/27/2016  Glucose 65 - 99 mg/dL 90 63(L) 96  BUN 6 - 20 mg/dL 6 7.1 7  Creatinine 0.44 - 1.00 mg/dL 0.63 0.8 0.74  Sodium 135 - 145 mmol/L 136 142 138  Potassium 3.5 - 5.1 mmol/L 3.2(L) 3.5 3.5  Chloride 101 - 111 mmol/L 101 - 103  CO2 22 - 32 mmol/L 26 27 29   Calcium 8.9 - 10.3 mg/dL 8.9 9.7 9.6  Total Protein 6.5 - 8.1 g/dL 6.6 7.7 7.7  Total Bilirubin 0.3 - 1.2 mg/dL 0.7 1.20 1.3(H)  Alkaline Phos 38 - 126 U/L 62 63 61  AST 15 - 41 U/L 54(H) 38(H) 47(H)  ALT 14 - 54 U/L 50 39 49    RADIOGRAPHIC STUDIES: I have personally reviewed the radiological images as listed and agreed with the findings in the report. No results found.  CT AP 10/28/16 IMPRESSION: No acute process demonstrated in the abdomen or pelvis. No evidence of bowel obstruction or  inflammation.  ASSESSMENT & PLAN: Mrs. Truszkowski is a 51 y.o. postmenopause female with:  1. Metastatic  right breast cancer to bone, ER+ HER 2+ 2. History of MRSA skin infection scalp, Boyle at low back scalp and upper lip 3. Congenital deafness 4. Premature surgical menopause 5. Intermittent anemia 6. Hypokalemia 7. Goals of care discussion 8. Left leg pain, left hip pain, left foot pain 9. Left gum/jaw edema 10. Insomnia 11. Relocation  Ms. [name] appears stable today. She returned from [place] state where she received 2 doses of kadcyla on 08/27/16 and 10/14/16. She reportedly did not have a scan, I will obtain outside records from Dr. Vonzella Nipple. She has increased left hip and foot pain. CT AP in the ED on 10/28/16 reveals no acute process in the abdomen or pelvis. I will obtain an Xray of her left foot today. If it is abnormal, she will see her orthopedic surgeon. She currently takes 2 norco at bedtime, she will increase frequency to use throughout the day for better pain control. I will obtain a restaging PET scan in 1-2 weeks to evaluate residual or new disease. CBC WNL; Cmet overall stable with mild elevation in AST, 38, improved from 1 week ago. No focal liver abnormality is seen on CT, this is possibly related to [facility]. OK to treat today, we will continue to monitor. She continues to have insomnia, ambien, trazadone, remeron, and ativan have not helped. Prescription given for low dose valium. First she will try increasing trazadone to 100 mg x2 weeks; if this does not help, she will take valium PRN at HS. For hypokalemia, she takes 20 mEq daily; she occasionally requires increased dose to 40 mEq; she will continue 20 mEq daily for now, K 3.5. She will continue letrozole daily and return in 3 weeks for f/u and next cycle kadcyla and zometa.  Plan: -labs reviewed, OK to treat with kadcyla today and  q3 weeks -continue zometa monthly -increase trazadone to 100 mg for sleep x2 weeks;  if not effective may try valium; prescription given -continue orak K 20 mEq daily -increase norco frequency for optimal pain control; 1 tab q6 hours -I will obtain records from Dr. Vonzella Nipple -Xray left foot today, refer back to orthopedic surgeon if abnormal -PET scan 1-2 weeks -return in 3 weeks for f/u and next cycle kadcyla and zometa   Orders Placed This Encounter  Procedures  . DG Foot Complete Left    Standing Status:   Future    Number of Occurrences:   1    Standing Expiration Date:   01/04/2018    Order Specific Question:   Reason for Exam (SYMPTOM  OR DIAGNOSIS REQUIRED)    Answer:   history of fall, left foot pain r/o fracture    Order Specific Question:   Is patient pregnant?    Answer:   No    Order Specific Question:   Preferred imaging location?    Answer:   [facility]    Order Specific Question:   Radiology Contrast Protocol - do NOT remove file path    Answer:   [email or web address]   All questions were answered. The patient knows to call the clinic with any problems, questions or concerns. No barriers to learning was detected.    Alla Feeling, NP 11/11/16   I have seen the patient, examined her. I agree with the assessment and and plan and have edited the notes.   Daijanae has returned to [place]. I will get her records from [place]. She continued letrozole and Kadcyla when she was here, but I don't believe she had restaging scans. I'll obtain a restaging PET scan in the next few weeks to evaluate her disease status and response to treatment. If PET scan shows no disease progression, I may consider a left hip MRI, given her worsening hip pain. I encouraged her to follow-up with her orthopedic surgeon.  Donis had a lot of questions, I addressed to the best of my knowledge. I spent 25 counseling the patient face to face. The total time spent in the appointment was 40 and more than 50% was on counseling and review of  test results   Alla Feeling  11/04/2016  Addendum 11/11/16: After the encounter we received records from [facility] in [place], [place]. She underwent CT CAP w contrast on 10/02/16 and bone scan on 09/21/16. CT shows no evidence of recurrent disease in the chest, abdomen, or pelvis; Bone scan indicates no lytic or blastic bone lesions. Dr. Burr Medico has reviewed. I will cancel PET scan since she had recent scans and insurance will not approve. We will continue to current plan of care kadcyla q3 weeks and zometa monthly. She will return 11/25/16 for lab, f/u, and next infusions.   Cira Rue, AGNP-C 11/11/2016

## 2016-11-04 NOTE — Progress Notes (Signed)
Pt's port-a-cath accessed by LPN, no blood return noted, site flushes well with NS. No swelling or pain noted upon flushing. Cath-Flo instilled per policy. Site marked. Will monitor for blood return.

## 2016-11-04 NOTE — Telephone Encounter (Signed)
Gave avs and calendar for for November

## 2016-11-05 ENCOUNTER — Encounter (HOSPITAL_COMMUNITY): Payer: Self-pay | Admitting: *Deleted

## 2016-11-05 ENCOUNTER — Emergency Department (HOSPITAL_COMMUNITY): Payer: Medicaid Other

## 2016-11-05 ENCOUNTER — Emergency Department (HOSPITAL_COMMUNITY)
Admission: EM | Admit: 2016-11-05 | Discharge: 2016-11-06 | Disposition: A | Discharge status: Home / Self Care | Payer: Medicaid Other | Attending: Emergency Medicine | Admitting: Emergency Medicine

## 2016-11-05 DIAGNOSIS — Z79899 Other long term (current) drug therapy: Secondary | ICD-10-CM | POA: Diagnosis not present

## 2016-11-05 DIAGNOSIS — R112 Nausea with vomiting, unspecified: Secondary | ICD-10-CM | POA: Diagnosis present

## 2016-11-05 DIAGNOSIS — K529 Noninfective gastroenteritis and colitis, unspecified: Secondary | ICD-10-CM | POA: Insufficient documentation

## 2016-11-05 DIAGNOSIS — Z853 Personal history of malignant neoplasm of breast: Secondary | ICD-10-CM | POA: Insufficient documentation

## 2016-11-05 DIAGNOSIS — R197 Diarrhea, unspecified: Secondary | ICD-10-CM

## 2016-11-05 DIAGNOSIS — W19XXXA Unspecified fall, initial encounter: Secondary | ICD-10-CM

## 2016-11-05 DIAGNOSIS — R109 Unspecified abdominal pain: Secondary | ICD-10-CM

## 2016-11-05 LAB — COMPREHENSIVE METABOLIC PANEL
ALK PHOS: 62 U/L (ref 38–126)
ALT: 50 U/L (ref 14–54)
AST: 54 U/L — AB (ref 15–41)
Albumin: 3.5 g/dL (ref 3.5–5.0)
Anion gap: 9 (ref 5–15)
BILIRUBIN TOTAL: 0.7 mg/dL (ref 0.3–1.2)
BUN: 6 mg/dL (ref 6–20)
CALCIUM: 8.9 mg/dL (ref 8.9–10.3)
CO2: 26 mmol/L (ref 22–32)
CREATININE: 0.63 mg/dL (ref 0.44–1.00)
Chloride: 101 mmol/L (ref 101–111)
Glucose, Bld: 90 mg/dL (ref 65–99)
Potassium: 3.2 mmol/L — ABNORMAL LOW (ref 3.5–5.1)
Sodium: 136 mmol/L (ref 135–145)
TOTAL PROTEIN: 6.6 g/dL (ref 6.5–8.1)

## 2016-11-05 LAB — CBC WITH DIFFERENTIAL/PLATELET
BASOS ABS: 0 10*3/uL (ref 0.0–0.1)
Basophils Relative: 0 %
EOS ABS: 0.1 10*3/uL (ref 0.0–0.7)
EOS PCT: 2 %
HCT: 35.2 % — ABNORMAL LOW (ref 36.0–46.0)
Hemoglobin: 12 g/dL (ref 12.0–15.0)
LYMPHS PCT: 30 %
Lymphs Abs: 1.7 10*3/uL (ref 0.7–4.0)
MCH: 29.6 pg (ref 26.0–34.0)
MCHC: 34.1 g/dL (ref 30.0–36.0)
MCV: 86.7 fL (ref 78.0–100.0)
MONO ABS: 0.5 10*3/uL (ref 0.1–1.0)
Monocytes Relative: 8 %
Neutro Abs: 3.5 10*3/uL (ref 1.7–7.7)
Neutrophils Relative %: 60 %
PLATELETS: 165 10*3/uL (ref 150–400)
RBC: 4.06 MIL/uL (ref 3.87–5.11)
RDW: 12.9 % (ref 11.5–15.5)
WBC: 5.9 10*3/uL (ref 4.0–10.5)

## 2016-11-05 LAB — LIPASE, BLOOD: LIPASE: 30 U/L (ref 11–51)

## 2016-11-05 LAB — I-STAT CG4 LACTIC ACID, ED: LACTIC ACID, VENOUS: 1.25 mmol/L (ref 0.5–1.9)

## 2016-11-05 LAB — CANCER ANTIGEN 27.29: CA 27.29: 43 U/mL — ABNORMAL HIGH (ref 0.0–38.6)

## 2016-11-05 MED ORDER — LOPERAMIDE HCL 2 MG PO CAPS
4.0000 mg | ORAL_CAPSULE | Freq: Once | ORAL | Status: AC
  Administered 2016-11-05: 4 mg via ORAL
  Filled 2016-11-05: qty 2

## 2016-11-05 MED ORDER — MORPHINE SULFATE (PF) 4 MG/ML IV SOLN
4.0000 mg | Freq: Once | INTRAVENOUS | Status: AC
  Administered 2016-11-05: 4 mg via INTRAVENOUS
  Filled 2016-11-05: qty 1

## 2016-11-05 MED ORDER — HEPARIN SOD (PORK) LOCK FLUSH 100 UNIT/ML IV SOLN
500.0000 [IU] | Freq: Once | INTRAVENOUS | Status: DC
  Filled 2016-11-05: qty 5

## 2016-11-05 MED ORDER — METOCLOPRAMIDE HCL 5 MG/ML IJ SOLN
10.0000 mg | Freq: Once | INTRAMUSCULAR | Status: AC
  Administered 2016-11-05: 10 mg via INTRAVENOUS
  Filled 2016-11-05: qty 2

## 2016-11-05 MED ORDER — SODIUM CHLORIDE 0.9 % IV BOLUS (SEPSIS)
1000.0000 mL | Freq: Once | INTRAVENOUS | Status: AC
  Administered 2016-11-05: 1000 mL via INTRAVENOUS

## 2016-11-05 MED ORDER — LOPERAMIDE HCL 2 MG PO CAPS: 2.0000 mg | ORAL_CAPSULE | Freq: Four times a day (QID) | ORAL | 0 refills | Status: AC | PRN

## 2016-11-05 NOTE — ED Provider Notes (Signed)
I saw and evaluated the patient, reviewed the resident's note and I agree with the findings and plan.  Pertinent History: Actively getting chemo for BRCA with bone mets - has lower abd cramping / pain with n/v/d - has been ongoing for 2 weeks - seen a week ago and had neg w/u - including CT scan and labs which was neg - sent home - has ongoing sx.  Nausea and vomiting with specs of blood in the vomit  Pertinent Exam findings: she has some mild dry / cracked lips - has diffuse ttp in the abd - no tachycardia  I was personally present and directly supervised the following procedures:  Ongoing symptoms - no tachycardia, normal BP  Fluids, supportive meds Recheck labs.  I personally interpreted the EKG as well as the resident and agree with the interpretation on the resident's chart.  Final diagnoses:  Fall  Abdominal pain  Nausea vomiting and diarrhea  Gastroenteritis    Physical Exam  BP (!) 145/84   Pulse 74   Temp 97.7 F (36.5 C) (Oral)   SpO2 96%   Physical Exam  ED Course  Procedures         Noemi Chapel, MD 11/08/16 [phone]

## 2016-11-05 NOTE — ED Notes (Signed)
Pt unhappy with this RN while I was accessing her port. Pt upset because I had to pump the bed up to an appropriate height level for myself, pt also unhappy because I had to unbutton her gown in order to visualize the site "signing" I was "exposing her." Informed pt that accessing a port is a sterile procedure and I must have the site clear of the gown and that her gown would be covering her breast at all times in order to keep her covered. Also unhappy bc she is here for abd pain but the EDP did not order a scan for the abd, attempted to explain to pt that typically we wait for blood work to result before imaging is indicated. Pt also not happy with this answer "signing" that she was not talking to me and I needed to stay out of her conversation. Port successfully accessed, pt then signs "can you not put the bed rail back up" I informed pt I was taking off my gloves first because they had blood on them then I would be putting the bed rail up if she could give me a moment to clean myself up first. Continues to remain unhappy during entire procedure stating I am disrespecting her and I better hurry up and leave before she "goes off" on me.

## 2016-11-05 NOTE — ED Notes (Signed)
Sign interpreter Judson Roch will arrive at [facility]

## 2016-11-05 NOTE — ED Triage Notes (Signed)
Pt into triage with a note requesting staff to call [place] interpreter. Pulled virtual interpreter machine into room and pt became angry and writes NO VRI on paper. On paper pt wrote that she is having pain on her left side, vomiting x3 with blood, dizzy, and nauseated. Appears in nad. Informed pt and visitor through written communication that I could triage pt with virtual interpreter until inperson gets here. Pt refused.

## 2016-11-05 NOTE — ED Notes (Signed)
Interpreter at bedside. Will pull pt to treatment room for assessment.

## 2016-11-05 NOTE — ED Provider Notes (Signed)
[facility] EMERGENCY DEPARTMENT Provider Note  CSN: [id number] Arrival date & time: 11/05/16  1827  History   Chief Complaint Chief Complaint  Patient presents with  . Emesis   HPI [name] is a 51 y.o. female.  The history is provided by the patient. A language interpreter was used (ASL interpreter).  Abdominal Pain   This is a recurrent problem. The current episode started more than 1 week ago. The problem occurs daily. The problem has not changed since onset.The pain is located in the LLQ and RLQ. The quality of the pain is dull, cramping and colicky. The pain is moderate. Associated symptoms include diarrhea, nausea and vomiting. Pertinent negatives include anorexia, fever, belching, dysuria, frequency, hematuria and arthralgias. The symptoms are aggravated by bowel movements. Nothing relieves the symptoms. Past workup includes CT scan. Past medical history comments: breast CA on chemotherapy.   Past Medical History:  Diagnosis Date  . Abnormal Pap smear   . Anxiety   . Breast cancer ([place])   . Cancer ([place]) 1999   breast-s/p mastectomy, chemo, rad  . CIN I (cervical intraepithelial neoplasia I) 2003   by colpo  . Congenital deafness   . Deaf   . Depression   . Radiation 07/12/15-07/26/15   left femur 30 Gy  . S/P bilateral oophorectomy    Patient Active Problem List   Diagnosis Date Noted  . Rash and nonspecific skin eruption 07/31/2016  . Neck pain 07/25/2016  . Goals of care, counseling/discussion 03/05/2016  . MRSA (methicillin resistant Staphylococcus aureus) infection 01/18/2016  . Hypokalemia 01/18/2016  . Folliculitis 62/13/0865  . Blood blister 01/10/2016  . Anemia due to antineoplastic chemotherapy 12/11/2015  . Lymphedema of left leg 12/11/2015  . Osteopenia of multiple sites 11/26/2015  . Drug-induced diarrhea 11/26/2015  . Pain of left hip joint 11/08/2015  . Chemotherapy-induced peripheral neuropathy ([place]) 11/03/2015  . Port  catheter in place 09/26/2015  . Chemotherapy induced nausea and vomiting 08/13/2015  . Cancer related pain 08/13/2015  . Portacath in place 08/13/2015  . Drug allergy, multiple 08/13/2015  . [name], right ([place]) 07/14/2015  . Other iron deficiency anemias 07/14/2015  . Claustrophobia 07/14/2015  . Status post laparoscopic cholecystectomy 07/14/2015  . S/P TRAM (transverse rectus abdominis muscle) flap breast reconstruction 07/14/2015  . Thyroid nodule 07/09/2015  . Constipation due to pain medication   . Left hip pain   . Depression   . Generalized anxiety disorder   . Esophageal reflux   . Genetic testing 04/04/2015  . Congenital deafness 02/26/2015  . Noncompliance with therapeutic plan 02/26/2015  . Hx of BSO (bilateral salpingo-oophorectomy) 02/26/2015  . Premature surgical menopause 02/26/2015  . Benign paroxysmal positional vertigo 07/24/2014  . Migraine 07/24/2014  . RLQ abdominal pain 09/30/2013  . Dysuria 09/30/2013  . Hx of breast cancer 04/28/2013  . Colon cancer screening 04/28/2013  . Migraine without aura, with intractable migraine, so stated, without mention of status migrainosus 06/22/2012  . Dizziness 05/14/2011  . Pharyngitis, acute 09/04/2010  . BACK STRAIN, LUMBAR 01/21/2010  . DE QUERVAIN'S TENOSYNOVITIS, LEFT WRIST 10/07/2009  . EMESIS 02/05/2009  . ABDOMINAL PAIN, GENERALIZED 02/05/2009  . DIZZINESS, CHRONIC 01/25/2007  . Insomnia 01/25/2007  . Hearing loss 03/11/2006  . CERVICAL DYSPLASIA 03/11/2006  . MENSTRUATION, PAINFUL 03/11/2006   Past Surgical History:  Procedure Laterality Date  . ABDOMINAL HYSTERECTOMY    . INTRAMEDULLARY (IM) NAIL INTERTROCHANTERIC Left 06/26/2015   Procedure: LEFT BIOMET  LONG AFFIXS NAIL;  Surgeon: [name], MD;  Location: [facility];  Service: Orthopedics;  Laterality: Left;  . IR GENERIC HISTORICAL  08/06/2015   IR US GUIDE VASC ACCESS LEFT 08/06/2015 Aletta Edouard, MD [facility]  . IR  GENERIC HISTORICAL  08/06/2015   IR FLUORO GUIDE CV LINE LEFT 08/06/2015 Aletta Edouard, MD [facility]  . IR GENERIC HISTORICAL  03/05/2016   IR CV LINE INJECTION 03/05/2016 Markus Daft, MD [facility]  . MASTECTOMY     right breast  . MASTECTOMY    . OVARIAN CYST REMOVAL    . RADIOLOGY WITH ANESTHESIA Left 06/25/2015   Procedure: MRI OF LEFT HIP WITH OR WITHOUT CONTRAST;  Surgeon: Medication Radiologist, MD;  Location: [facility];  Service: Radiology;  Laterality: Left;  DR. MCINTYRE/MRI  . TUBAL LIGATION     OB History    Gravida Para Term Preterm AB Living   2 2 1 1  0 2   SAB TAB Ectopic Multiple Live Births   0 0 0 0       Home Medications    Prior to Admission medications   Medication Sig Start Date End Date Taking? Authorizing Provider  Cholecalciferol 2000 units CAPS Take 1 capsule by mouth daily.   Yes [provider]  diazepam (VALIUM) 5 MG tablet Take 1 tablet (5 mg total) by mouth at bedtime as needed for anxiety. Patient taking differently: Take 5 mg by mouth at bedtime.  11/04/16  Yes Truitt Merle, MD  divalproex (DEPAKOTE) 250 MG DR tablet Take 1 tablet (250 mg total) by mouth at bedtime. 11/04/16  Yes Alla Feeling, NP  HYDROcodone-acetaminophen (NORCO) 5-325 MG tablet Take 1 tablet by mouth every 6 (six) hours as needed for moderate pain. 07/27/16  Yes Truitt Merle, MD  letrozole [facility]) 2.5 MG tablet Take 1 tablet (2.5 mg total) by mouth daily. 07/13/16  Yes Truitt Merle, MD  lidocaine-prilocaine (EMLA) cream Apply 1 application topically as needed. Apply to Porta-Cath 1-2 hours prior to access as directed. 04/03/16  Yes Truitt Merle, MD  LORazepam (ATIVAN) 1 MG tablet Place one tablet under the tongue or swallow every 4-6 hours as needed for nausea. (THIS IS THE SAME STRENGTH AS 2 OF YOUR 0.5 MG TABLETS) 09/06/15  Yes Livesay, Lennis P, MD  ondansetron (ZOFRAN) 8 MG tablet Take 1 tablet (8 mg total) by mouth every 8 (eight) hours as needed for nausea or vomiting. 08/29/15  Yes  Livesay, Lennis P, MD  potassium chloride SA (K-DUR,KLOR-CON) 20 MEQ tablet Take 1 tablet (20 mEq total) by mouth daily. 11/04/16  Yes Alla Feeling, NP  prochlorperazine (COMPAZINE) 10 MG tablet Take 1 tablet (10 mg total) by mouth every 6 (six) hours as needed for nausea or vomiting. 06/26/16  Yes Truitt Merle, MD   Family History Family History  Problem Relation Age of Onset  . Hypertension Mother   . Seizures Mother   . Alzheimer's disease Maternal Uncle   . Pancreatitis Maternal Grandmother   . Heart attack Maternal Grandfather     Social History Social History  Substance Use Topics  . Smoking status: Never Smoker  . Smokeless tobacco: Never Used  . Alcohol use No   Allergies   Trazodone and nefazodone; Chlorhexidine; Promethazine hcl; and Diphenhydramine hcl  Review of Systems Review of Systems  Constitutional: Negative for chills and fever.  HENT: Negative for ear pain and sore throat.   Eyes: Negative for pain and visual disturbance.  Respiratory:  Negative for cough and shortness of breath.   Cardiovascular: Negative for chest pain and palpitations.  Gastrointestinal: Positive for abdominal pain, diarrhea, nausea and vomiting. Negative for anorexia.  Genitourinary: Negative for dysuria, frequency and hematuria.  Musculoskeletal: Negative for arthralgias and back pain.  Skin: Negative for color change and rash.  Neurological: Negative for seizures and syncope.  All other systems reviewed and are negative.  Physical Exam Updated Vital Signs BP (!) 145/84   Pulse 74   Temp 97.7 F (36.5 C) (Oral)   SpO2 96%   Physical Exam  Constitutional: She appears well-developed and well-nourished. No distress.  HENT:  Head: Normocephalic and atraumatic.  Eyes: Pupils are equal, round, and reactive to light. Conjunctivae and EOM are normal.  Neck: Normal range of motion. Neck supple.  Cardiovascular: Normal rate and regular rhythm.   No murmur heard. Pulmonary/Chest:  Effort normal and breath sounds normal. No respiratory distress.  Abdominal: Soft. Bowel sounds are normal. She exhibits no distension. There is no tenderness.  Musculoskeletal: Normal range of motion. She exhibits edema and tenderness (over the left knee and distal femur). She exhibits no deformity.  Neurological: She is alert. No cranial nerve deficit. She exhibits normal muscle tone.  Skin: Skin is warm and dry. She is not diaphoretic.  Psychiatric: She has a normal mood and affect.  Nursing note and vitals reviewed.  ED Treatments / Results  Labs (all labs ordered are listed, but only abnormal results are displayed) Labs Reviewed  COMPREHENSIVE METABOLIC PANEL - Abnormal; Notable for the following:       Result Value   Potassium 3.2 (*)    AST 54 (*)    All other components within normal limits  CBC WITH DIFFERENTIAL/PLATELET - Abnormal; Notable for the following:    HCT 35.2 (*)    All other components within normal limits  LIPASE, BLOOD  URINALYSIS, ROUTINE W REFLEX MICROSCOPIC  I-STAT CG4 LACTIC ACID, ED  I-STAT CG4 LACTIC ACID, ED   EKG  EKG Interpretation None      Radiology Dg Abd 2 Views  Result Date: 11/05/2016 CLINICAL DATA:  Pain on the left side with vomiting EXAM: ABDOMEN - 2 VIEW COMPARISON:  CT 10/28/2016 FINDINGS: Surgical clips over the right abdomen. Nonobstructed bowel-gas pattern. No abnormal calcifications. No free air beneath the diaphragm. IMPRESSION: Nonobstructed bowel-gas pattern. Electronically Signed   By: Donavan Foil M.D.   On: 11/05/2016 22:59   Dg Foot Complete Left  Result Date: 11/04/2016 CLINICAL DATA:  Pain left foot laterally 5th metatarsal area x 2 months; no known injury ; evaluate for fracture EXAM: LEFT FOOT - COMPLETE 3+ VIEW COMPARISON:  None. FINDINGS: No fracture or dislocation of mid foot or forefoot. The phalanges are normal. The calcaneus is normal. No soft tissue abnormality. IMPRESSION: No fracture or dislocation.  No soft  tissue abnormality Electronically Signed   By: Suzy Bouchard M.D.   On: 11/04/2016 16:38   Dg Femur Min 2 Views Left  Result Date: 11/05/2016 CLINICAL DATA:  Pain to the left side recent fall EXAM: LEFT FEMUR 2 VIEWS COMPARISON:  11/07/2015 FINDINGS: Stable intramedullary rodding and distal screw fixation of left femur. Bony sclerosis in the proximal left femur at the site of prior metastatic focus. This appears less apparent compared to the prior radiograph. Intact hardware and normal alignment. Mild degenerative changes at the left knee. No acute osseous abnormality. IMPRESSION: Stable appearance of left femoral hardware. Sclerosis and minimal permeative appearance of the proximal left  femur at the site of previously demonstrated metastatic focus. Electronically Signed   By: Donavan Foil M.D.   On: 11/05/2016 22:57   Procedures Procedures (including critical care time)  Medications Ordered in ED Medications  sodium chloride 0.9 % bolus 1,000 mL (1,000 mLs Intravenous New Bag/Given 11/05/16 2209)  morphine 4 MG/ML injection 4 mg (4 mg Intravenous Given 11/05/16 2209)  metoCLOPramide (REGLAN) injection 10 mg (10 mg Intravenous Given 11/05/16 2209)   Initial Impression / Assessment and Plan / ED Course  I have reviewed the triage vital signs and the nursing notes.  Pertinent labs & imaging results that were available during my care of the patient were reviewed by me and considered in my medical decision making (see chart for details).  TALETHA TWIFORD is a 51 y.o. female with significant PMHx of breast CA who presented with abdominal pain, nausea, vomiting, and diarrhea.   Given IVF, reglan, and morphine with improvement of symptoms. No episode of emesis or diarrhea while in the ED.   Labs studies: CBC within normal limits, no leukocytosis, CMP within normal limits. Lipase normal.   Other causes considered include, however at this time doubt Appendicitis, Bowel Obstruction, AAA, UTI,  Pyelonephritis, Nephrolithiasis, Pancreatitis, Cholecystitis, Shingles, Perforated Bowel or Ulcer, Diverticulosis/itis, Ischemic Mesentery, Inflammatory Bowel Disease, Strangulated/Incarcerated Hernia, PID, Intrauterine Pregnancy, Ectopic Pregnancy, Ovarian Torsion, Tubal Ovarian Abscess, STD, or other abdominal emergency as this would be inconsistent with history and physical, low risk, other diagnosis much more likely.   Left lower extremity XR ordered due to complaint of recent falls and leg pain: XR without evidence of fracture, patient able to ambulate without difficulty in the ED.  Dc home, return precautions given, follow up with primary care provider, patient in agreement with plan. Given Rx for imodium  The plan for this patient was discussed with Dr. Sabra Heck, who voiced agreement and who oversaw evaluation and treatment of this patient.   Final Clinical Impressions(s) / ED Diagnoses   Final diagnoses:  Fall  Abdominal pain  Nausea vomiting and diarrhea  Gastroenteritis   New Prescriptions New Prescriptions   No medications on file     Fenton Foy, MD 11/05/16 2354    Noemi Chapel, MD 11/08/16 [phone]

## 2016-11-05 NOTE — ED Notes (Signed)
Pt states she has been on the same chemo for the last year. States that her last treatment was yesterday.

## 2016-11-06 LAB — URINALYSIS, ROUTINE W REFLEX MICROSCOPIC
Bacteria, UA: NONE SEEN
Bilirubin Urine: NEGATIVE
Glucose, UA: NEGATIVE mg/dL
KETONES UR: NEGATIVE mg/dL
Leukocytes, UA: NEGATIVE
Nitrite: NEGATIVE
PH: 6 (ref 5.0–8.0)
Protein, ur: NEGATIVE mg/dL
SPECIFIC GRAVITY, URINE: 1.012 (ref 1.005–1.030)
SQUAMOUS EPITHELIAL / LPF: NONE SEEN

## 2016-11-11 ENCOUNTER — Other Ambulatory Visit: Payer: Self-pay | Admitting: Nurse Practitioner

## 2016-11-11 ENCOUNTER — Telehealth: Payer: Self-pay | Admitting: *Deleted

## 2016-11-11 DIAGNOSIS — C7951 Secondary malignant neoplasm of bone: Principal | ICD-10-CM

## 2016-11-11 DIAGNOSIS — C50911 Malignant neoplasm of unspecified site of right female breast: Secondary | ICD-10-CM

## 2016-11-11 NOTE — Telephone Encounter (Signed)
Left message for pt via deaf interpreter that x-ray was negative of foot & to call if any questions.

## 2016-11-11 NOTE — Telephone Encounter (Signed)
-----   Message from Truitt Merle, MD sent at 11/10/2016 10:29 PM EDT ----- Please let pt know her X-RAY results, thanks   Truitt Merle  11/10/2016

## 2016-11-17 ENCOUNTER — Telehealth: Payer: Self-pay | Admitting: Internal Medicine

## 2016-11-18 ENCOUNTER — Other Ambulatory Visit: Payer: Self-pay

## 2016-11-18 ENCOUNTER — Ambulatory Visit (INDEPENDENT_AMBULATORY_CARE_PROVIDER_SITE_OTHER): Payer: Medicaid Other | Admitting: Internal Medicine

## 2016-11-18 ENCOUNTER — Encounter: Payer: Self-pay | Admitting: Internal Medicine

## 2016-11-18 VITALS — BP 118/72 | HR 92 | Temp 97.8°F | Ht 65.0 in | Wt 131.8 lb

## 2016-11-18 DIAGNOSIS — M79652 Pain in left thigh: Secondary | ICD-10-CM | POA: Diagnosis not present

## 2016-11-18 DIAGNOSIS — G4700 Insomnia, unspecified: Secondary | ICD-10-CM | POA: Diagnosis not present

## 2016-11-18 DIAGNOSIS — M79651 Pain in right thigh: Secondary | ICD-10-CM

## 2016-11-18 MED ORDER — TRAZODONE HCL 50 MG PO TABS: 50.0000 mg | ORAL_TABLET | Freq: Every evening | ORAL | 3 refills | Status: DC | PRN

## 2016-11-18 MED ORDER — DIAZEPAM 5 MG PO TABS: 5.0000 mg | ORAL_TABLET | Freq: Every evening | ORAL | 0 refills | Status: DC | PRN

## 2016-11-18 MED ORDER — HYDROMORPHONE HCL 2 MG PO TABS: 2.0000 mg | ORAL_TABLET | ORAL | 0 refills | Status: DC | PRN

## 2016-11-18 NOTE — Patient Instructions (Signed)
It was so nice to see you!  I have ordered CT scans of both of your thighs. I will call you with these results.  I have also started a new pain medication called Dilaudid. You can take 1 tablet every 4 hours as needed.  -Dr. Brett Albino

## 2016-11-19 DIAGNOSIS — M79651 Pain in right thigh: Secondary | ICD-10-CM | POA: Insufficient documentation

## 2016-11-19 DIAGNOSIS — M79652 Pain in left thigh: Principal | ICD-10-CM

## 2016-11-19 NOTE — Assessment & Plan Note (Signed)
Likely due to bony metastatic disease. She is s/p internal fixation of the left femur in 2017, which was performed by Dr. [name]. X-rays performed 11/05/16 were unremarkable. Most recent PET scan was performed 05/20/16 and showed multiple metastatic foci in the bony pelvis, including new abnormal foci compared to a prior PET scan, compatible with mildly progressive bony metastatic disease.  - Will obtain CT scan of the bilateral thighs to evaluate further. - Prescribed Dilaudid 2mg  q4hrs prn for bone pain - May need to consider MRI vs repeat PET scan if CT is unremarkable. Could also consider referral back to Dr. [name] for further evaluation. - Precepted with Dr. Nori Riis

## 2016-11-19 NOTE — Progress Notes (Signed)
   [facility] Phone: [phone]  Subjective:  Etty is a 51 year old female presenting to clinic with bilateral thigh pain for the last month. The pain in the left thigh is worse than the right. The pain feels like she was "hit by a car". She has been seen in the ED for this and was given Norco 5-325mg , which hasn't helped at all. She has tried taking 2 tablets, but this didn't help either. The pain feels like it radiates down her leg. The pain feels the same way that it did in 2017, when she was found to have recurrence of her breast cancer with widespread bony metastatic disease. She underwent stabilization of the left proximal femur on 06/26/15 to prevent imminent pathological fracture. She has had a few falls at home due to the pain. She has had x-rays that have all been normal.  Patient also endorses difficulty sleeping due to the pain. She has been prescribed Valium and Trazodone for sleep, which help her a lot. She is requesting a refill of these medications today.  ROS: See HPI for pertinent positives and negatives  Past Medical History- congenital deafness, hx breast cancer in 1999 with recurrence and bony metastatic disease in 2017, depression/anxiety.  Family history reviewed for today's visit. No changes.  Social history- patient is a never smoker.  Objective: BP 118/72   Pulse 92   Temp 97.8 F (36.6 C) (Oral)   Ht 5\' 5"  (1.651 m)   Wt 131 lb 12.8 oz (59.8 kg)   SpO2 97%   BMI 21.93 kg/m  Gen: NAD, alert, cooperative with exam Msk: No erythema, edema, or gross deformity of the thighs bilaterally, mild diffuse tenderness to palpation of the thighs, decreased ROM of the hips bilaterally, pain with internal and external rotation of the hips Neuro: bilateral lower extremities are neurovascularly intact.  Assessment/Plan: Bilateral Thigh Pain: Likely due to bony metastatic disease. She is s/p internal fixation of the left femur in 2017, which was  performed by Dr. [name]. X-rays performed 11/05/16 were unremarkable. Most recent PET scan was performed 05/20/16 and showed multiple metastatic foci in the bony pelvis, including new abnormal foci compared to a prior PET scan, compatible with mildly progressive bony metastatic disease.  - Will obtain CT scan of the bilateral thighs to evaluate further. - Prescribed Dilaudid 2mg  q4hrs prn for bone pain - May need to consider MRI vs repeat PET scan if CT is unremarkable. Could also consider referral back to Dr. [name] for further evaluation. - Precepted with Dr. Nori Riis  Insomnia: Related to bone pain. - Will prescribe Valium #30 and Trazodone #30   Hyman Bible, MD PGY-3

## 2016-11-19 NOTE — Assessment & Plan Note (Signed)
Related to bone pain. - Will prescribe Valium #30 and Trazodone #30

## 2016-11-23 ENCOUNTER — Telehealth: Payer: Self-pay | Admitting: *Deleted

## 2016-11-23 ENCOUNTER — Other Ambulatory Visit: Payer: Self-pay | Admitting: *Deleted

## 2016-11-23 ENCOUNTER — Ambulatory Visit (HOSPITAL_COMMUNITY)
Admission: RE | Admit: 2016-11-23 | Discharge: 2016-11-23 | Disposition: A | Discharge status: Home / Self Care | Payer: Medicaid Other | Source: Ambulatory Visit | Attending: Family Medicine | Admitting: Family Medicine

## 2016-11-23 DIAGNOSIS — M79651 Pain in right thigh: Secondary | ICD-10-CM

## 2016-11-23 DIAGNOSIS — M79652 Pain in left thigh: Secondary | ICD-10-CM | POA: Insufficient documentation

## 2016-11-23 DIAGNOSIS — M533 Sacrococcygeal disorders, not elsewhere classified: Secondary | ICD-10-CM | POA: Insufficient documentation

## 2016-11-23 DIAGNOSIS — C7951 Secondary malignant neoplasm of bone: Secondary | ICD-10-CM | POA: Insufficient documentation

## 2016-11-23 MED ORDER — LETROZOLE 2.5 MG PO TABS
2.5000 mg | ORAL_TABLET | Freq: Every day | ORAL | 2 refills | Status: DC
Start: 2016-11-23 — End: 2017-02-02

## 2016-11-23 NOTE — Telephone Encounter (Signed)
Received vm call via interpreter that pt needs a refill on her letrozole.  She reports only 2 left.

## 2016-11-24 ENCOUNTER — Telehealth: Payer: Self-pay | Admitting: Internal Medicine

## 2016-11-24 ENCOUNTER — Telehealth: Payer: Self-pay

## 2016-11-24 ENCOUNTER — Telehealth (INDEPENDENT_AMBULATORY_CARE_PROVIDER_SITE_OTHER): Payer: Self-pay | Admitting: Radiology

## 2016-11-24 NOTE — Telephone Encounter (Signed)
I called discussed.  

## 2016-11-24 NOTE — Telephone Encounter (Signed)
Call report on left femur is available for your review.[name]

## 2016-11-24 NOTE — Telephone Encounter (Signed)
Received patient's CT scan results. CT left femur showed a linear lucency along the anterolateral proximal femoral cortex, suspicious for nondisplaced fracture. Discussed with patient's orthopedic surgeon, Dr. Lorin [name], over the phone. He performed a fixation of her left femur in 2017 for impending pathologic fracture secondary to bony metastases. Dr. Lorin [name] personally reviewed her CT scan. He thinks this may be a fracture. He recommended that she do protected weight bearing with a walker. He would like for her to follow-up in clinic with him in 2 months. She does not need to be seen in his clinic urgently.  I discussed this plan with the patient over the phone. She has a walker at home, but hasn't been using it. She voiced understanding and states that she will use the walker. She will call to schedule an appointment with Dr. Lorin [name]' office in 2 months.  Hyman Bible, MD PGY-3

## 2016-11-24 NOTE — Telephone Encounter (Signed)
Pt called wanting to know her results of her CT scan she had yesterday. Pt really wants to be called back today about her results, because she is very concerned about them. Please call the pt at [phone].

## 2016-11-24 NOTE — Telephone Encounter (Signed)
Dr. Brett Albino from [facility] left voicemail, calling to discuss patient plan of care with Dr. [name]. Patient had been seeing her for left thigh pain. History of left femur fracture, history of metastatic disease. Had surgery on 06/2015. She is wanting Dr. [name] to call back and discuss this patient. Her cell phone is [phone].

## 2016-11-24 NOTE — Telephone Encounter (Signed)
Will forward to Dr. Brett Albino for these results. Jazmin Hartsell,CMA

## 2016-11-24 NOTE — Telephone Encounter (Signed)
Please advise. Thanks.  

## 2016-11-24 NOTE — Telephone Encounter (Signed)
Attempted to call patient x 1. Left voicemail. Will try to call again tomorrow.

## 2016-11-24 NOTE — Telephone Encounter (Signed)
Thanks

## 2016-11-25 ENCOUNTER — Ambulatory Visit (HOSPITAL_BASED_OUTPATIENT_CLINIC_OR_DEPARTMENT_OTHER): Payer: Medicaid Other

## 2016-11-25 ENCOUNTER — Telehealth: Payer: Self-pay | Admitting: Nurse Practitioner

## 2016-11-25 ENCOUNTER — Ambulatory Visit: Payer: Medicaid Other

## 2016-11-25 ENCOUNTER — Ambulatory Visit (HOSPITAL_BASED_OUTPATIENT_CLINIC_OR_DEPARTMENT_OTHER): Payer: Medicaid Other | Admitting: Nurse Practitioner

## 2016-11-25 ENCOUNTER — Encounter: Payer: Self-pay | Admitting: Nurse Practitioner

## 2016-11-25 ENCOUNTER — Encounter: Payer: Self-pay | Admitting: *Deleted

## 2016-11-25 VITALS — BP 110/64 | HR 76 | Temp 97.6°F | Resp 18 | Ht 65.0 in | Wt 132.2 lb

## 2016-11-25 DIAGNOSIS — G4700 Insomnia, unspecified: Secondary | ICD-10-CM | POA: Diagnosis not present

## 2016-11-25 DIAGNOSIS — M25552 Pain in left hip: Secondary | ICD-10-CM | POA: Diagnosis not present

## 2016-11-25 DIAGNOSIS — C799 Secondary malignant neoplasm of unspecified site: Principal | ICD-10-CM

## 2016-11-25 DIAGNOSIS — M79672 Pain in left foot: Secondary | ICD-10-CM | POA: Diagnosis not present

## 2016-11-25 DIAGNOSIS — C50911 Malignant neoplasm of unspecified site of right female breast: Secondary | ICD-10-CM

## 2016-11-25 DIAGNOSIS — E876 Hypokalemia: Secondary | ICD-10-CM

## 2016-11-25 DIAGNOSIS — H905 Unspecified sensorineural hearing loss: Secondary | ICD-10-CM | POA: Diagnosis not present

## 2016-11-25 DIAGNOSIS — R600 Localized edema: Secondary | ICD-10-CM | POA: Diagnosis not present

## 2016-11-25 DIAGNOSIS — Z95828 Presence of other vascular implants and grafts: Secondary | ICD-10-CM

## 2016-11-25 DIAGNOSIS — C7951 Secondary malignant neoplasm of bone: Secondary | ICD-10-CM

## 2016-11-25 DIAGNOSIS — D649 Anemia, unspecified: Secondary | ICD-10-CM

## 2016-11-25 DIAGNOSIS — C50919 Malignant neoplasm of unspecified site of unspecified female breast: Secondary | ICD-10-CM

## 2016-11-25 DIAGNOSIS — Z5112 Encounter for antineoplastic immunotherapy: Secondary | ICD-10-CM | POA: Diagnosis not present

## 2016-11-25 LAB — COMPREHENSIVE METABOLIC PANEL
ALBUMIN: 3.5 g/dL (ref 3.5–5.0)
ALK PHOS: 69 U/L (ref 40–150)
ALT: 29 U/L (ref 0–55)
ANION GAP: 11 meq/L (ref 3–11)
AST: 24 U/L (ref 5–34)
BILIRUBIN TOTAL: 0.95 mg/dL (ref 0.20–1.20)
BUN: 7.6 mg/dL (ref 7.0–26.0)
CALCIUM: 9.5 mg/dL (ref 8.4–10.4)
CO2: 26 mEq/L (ref 22–29)
Chloride: 106 mEq/L (ref 98–109)
Creatinine: 0.8 mg/dL (ref 0.6–1.1)
Glucose: 110 mg/dl (ref 70–140)
POTASSIUM: 2.9 meq/L — AB (ref 3.5–5.1)
Sodium: 142 mEq/L (ref 136–145)
TOTAL PROTEIN: 7.8 g/dL (ref 6.4–8.3)

## 2016-11-25 LAB — CBC WITH DIFFERENTIAL/PLATELET
BASO%: 0.4 % (ref 0.0–2.0)
Basophils Absolute: 0 10*3/uL (ref 0.0–0.1)
EOS%: 2.6 % (ref 0.0–7.0)
Eosinophils Absolute: 0.1 10*3/uL (ref 0.0–0.5)
HCT: 35.4 % (ref 34.8–46.6)
HEMOGLOBIN: 11.8 g/dL (ref 11.6–15.9)
LYMPH#: 1.9 10*3/uL (ref 0.9–3.3)
LYMPH%: 38 % (ref 14.0–49.7)
MCH: 29.4 pg (ref 25.1–34.0)
MCHC: 33.3 g/dL (ref 31.5–36.0)
MCV: 88.1 fL (ref 79.5–101.0)
MONO#: 0.5 10*3/uL (ref 0.1–0.9)
MONO%: 9.3 % (ref 0.0–14.0)
NEUT#: 2.5 10*3/uL (ref 1.5–6.5)
NEUT%: 49.7 % (ref 38.4–76.8)
Platelets: 167 10*3/uL (ref 145–400)
RBC: 4.02 10*6/uL (ref 3.70–5.45)
RDW: 13.2 % (ref 11.2–14.5)
WBC: 5.1 10*3/uL (ref 3.9–10.3)

## 2016-11-25 MED ORDER — HYDROCODONE-ACETAMINOPHEN 5-325 MG PO TABS: 1.0000 | ORAL_TABLET | Freq: Four times a day (QID) | ORAL | 0 refills | Status: DC | PRN

## 2016-11-25 MED ORDER — ACETAMINOPHEN 325 MG PO TABS
ORAL_TABLET | ORAL | Status: AC
  Filled 2016-11-25: qty 2

## 2016-11-25 MED ORDER — SODIUM CHLORIDE 0.9 % IV SOLN
INTRAVENOUS | Status: DC
  Administered 2016-11-25: 13:00:00 via INTRAVENOUS
  Filled 2016-11-25 (×2): qty 250

## 2016-11-25 MED ORDER — SODIUM CHLORIDE 0.9 % IV SOLN
3.4000 mg/kg | Freq: Once | INTRAVENOUS | Status: AC
  Administered 2016-11-25: 200 mg via INTRAVENOUS
  Filled 2016-11-25: qty 10

## 2016-11-25 MED ORDER — POTASSIUM CHLORIDE 20 MEQ/15ML (10%) PO SOLN: 20.0000 meq | Freq: Two times a day (BID) | ORAL | 3 refills | Status: DC

## 2016-11-25 MED ORDER — LIDOCAINE-PRILOCAINE 2.5-2.5 % EX CREA: 1.0000 "application " | TOPICAL_CREAM | CUTANEOUS | 2 refills | Status: DC | PRN

## 2016-11-25 MED ORDER — ACETAMINOPHEN 325 MG PO TABS
650.0000 mg | ORAL_TABLET | Freq: Once | ORAL | Status: AC
  Administered 2016-11-25: 650 mg via ORAL

## 2016-11-25 MED ORDER — ZOLEDRONIC ACID 4 MG/100ML IV SOLN
4.0000 mg | Freq: Once | INTRAVENOUS | Status: AC
  Administered 2016-11-25: 4 mg via INTRAVENOUS
  Filled 2016-11-25: qty 100

## 2016-11-25 MED ORDER — OXYCODONE-ACETAMINOPHEN 5-325 MG PO TABS
ORAL_TABLET | ORAL | Status: AC
  Filled 2016-11-25: qty 1

## 2016-11-25 MED ORDER — HEPARIN SOD (PORK) LOCK FLUSH 100 UNIT/ML IV SOLN
500.0000 [IU] | Freq: Once | INTRAVENOUS | Status: AC | PRN
  Administered 2016-11-25: 500 [IU]
  Filled 2016-11-25: qty 5

## 2016-11-25 MED ORDER — SODIUM CHLORIDE 0.9% FLUSH
10.0000 mL | INTRAVENOUS | Status: DC | PRN
  Administered 2016-11-25: 10 mL
  Filled 2016-11-25: qty 10

## 2016-11-25 MED ORDER — OXYCODONE-ACETAMINOPHEN 5-325 MG PO TABS
1.0000 | ORAL_TABLET | Freq: Once | ORAL | Status: AC
Start: 2016-11-25 — End: 2016-11-25
  Administered 2016-11-25: 1 via ORAL

## 2016-11-25 MED ORDER — SODIUM CHLORIDE 0.9 % IV SOLN
Freq: Once | INTRAVENOUS | Status: AC
  Administered 2016-11-25: 13:00:00 via INTRAVENOUS

## 2016-11-25 MED ORDER — SODIUM CHLORIDE 0.9 % IV SOLN
4.0000 mg | Freq: Once | INTRAVENOUS | Status: DC
  Filled 2016-11-25 (×2): qty 5
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
## 2016-11-25 NOTE — Patient Instructions (Signed)
[name] Discharge Instructions for Patients Receiving Chemotherapy  Today you received the following chemotherapy agents: Kadcyla  To help prevent nausea and vomiting after your treatment, we encourage you to take your nausea medication as prescribed. If you develop nausea and vomiting that is not controlled by your nausea medication, call the clinic.   BELOW ARE SYMPTOMS THAT SHOULD BE REPORTED IMMEDIATELY:  *FEVER GREATER THAN 100.5 F  *CHILLS WITH OR WITHOUT FEVER  NAUSEA AND VOMITING THAT IS NOT CONTROLLED WITH YOUR NAUSEA MEDICATION  *UNUSUAL SHORTNESS OF BREATH  *UNUSUAL BRUISING OR BLEEDING  TENDERNESS IN MOUTH AND THROAT WITH OR WITHOUT PRESENCE OF ULCERS  *URINARY PROBLEMS  *BOWEL PROBLEMS  UNUSUAL RASH Items with * indicate a potential emergency and should be followed up as soon as possible.  Feel free to call the clinic should you have any questions or concerns. The clinic phone number is [phone].  Please show the [facility] at check-in to the Emergency Department and triage nurse.  Zoledronic Acid injection (Hypercalcemia, Oncology) What is this medicine? ZOLEDRONIC ACID (ZOE le dron ik AS id) lowers the amount of calcium loss from bone. It is used to treat too much calcium in your blood from cancer. It is also used to prevent complications of cancer that has spread to the bone. This medicine may be used for other purposes; ask your health care provider or pharmacist if you have questions. COMMON BRAND NAME(S): Zometa What should I tell my health care provider before I take this medicine? They need to know if you have any of these conditions: -aspirin-sensitive asthma -cancer, especially if you are receiving medicines used to treat cancer -dental disease or wear dentures -infection -kidney disease -receiving corticosteroids like dexamethasone or prednisone -an unusual or allergic reaction to zoledronic acid, other medicines,  foods, dyes, or preservatives -pregnant or trying to get pregnant -breast-feeding How should I use this medicine? This medicine is for infusion into a vein. It is given by a health care professional in a hospital or clinic setting. Talk to your pediatrician regarding the use of this medicine in children. Special care may be needed. Overdosage: If you think you have taken too much of this medicine contact a poison control center or emergency room at once. NOTE: This medicine is only for you. Do not share this medicine with others. What if I miss a dose? It is important not to miss your dose. Call your doctor or health care professional if you are unable to keep an appointment. What may interact with this medicine? -certain antibiotics given by injection -NSAIDs, medicines for pain and inflammation, like ibuprofen or naproxen -some diuretics like bumetanide, furosemide -teriparatide -thalidomide This list may not describe all possible interactions. Give your health care provider a list of all the medicines, herbs, non-prescription drugs, or dietary supplements you use. Also tell them if you smoke, drink alcohol, or use illegal drugs. Some items may interact with your medicine. What should I watch for while using this medicine? Visit your doctor or health care professional for regular checkups. It may be some time before you see the benefit from this medicine. Do not stop taking your medicine unless your doctor tells you to. Your doctor may order blood tests or other tests to see how you are doing. Women should inform their doctor if they wish to become pregnant or think they might be pregnant. There is a potential for serious side effects to an unborn child. Talk to your health  care professional or pharmacist for more information. You should make sure that you get enough calcium and vitamin D while you are taking this medicine. Discuss the foods you eat and the vitamins you take with your health  care professional. Some people who take this medicine have severe bone, joint, and/or muscle pain. This medicine may also increase your risk for jaw problems or a broken thigh bone. Tell your doctor right away if you have severe pain in your jaw, bones, joints, or muscles. Tell your doctor if you have any pain that does not go away or that gets worse. Tell your dentist and dental surgeon that you are taking this medicine. You should not have major dental surgery while on this medicine. See your dentist to have a dental exam and fix any dental problems before starting this medicine. Take good care of your teeth while on this medicine. Make sure you see your dentist for regular follow-up appointments. What side effects may I notice from receiving this medicine? Side effects that you should report to your doctor or health care professional as soon as possible: -allergic reactions like skin rash, itching or hives, swelling of the face, lips, or tongue -anxiety, confusion, or depression -breathing problems -changes in vision -eye pain -feeling faint or lightheaded, falls -jaw pain, especially after dental work -mouth sores -muscle cramps, stiffness, or weakness -redness, blistering, peeling or loosening of the skin, including inside the mouth -trouble passing urine or change in the amount of urine Side effects that usually do not require medical attention (report to your doctor or health care professional if they continue or are bothersome): -bone, joint, or muscle pain -constipation -diarrhea -fever -hair loss -irritation at site where injected -loss of appetite -nausea, vomiting -stomach upset -trouble sleeping -trouble swallowing -weak or tired This list may not describe all possible side effects. Call your doctor for medical advice about side effects. You may report side effects to FDA at 1-800-FDA-1088. Where should I keep my medicine? This drug is given in a hospital or clinic and  will not be stored at home. NOTE: This sheet is a summary. It may not cover all possible information. If you have questions about this medicine, talk to your doctor, pharmacist, or health care provider.  2018 Elsevier/Gold Standard (2013-05-27 14:19:39)

## 2016-11-25 NOTE — Patient Instructions (Signed)
[facility] Discharge Instructions for Patients Receiving Chemotherapy  Today you received the following chemotherapy agents Kadcyla and Zometa.  To help prevent nausea and vomiting after your treatment, we encourage you to take your nausea medication as directed.   If you develop nausea and vomiting that is not controlled by your nausea medication, call the clinic.   BELOW ARE SYMPTOMS THAT SHOULD BE REPORTED IMMEDIATELY:  *FEVER GREATER THAN 100.5 F  *CHILLS WITH OR WITHOUT FEVER  NAUSEA AND VOMITING THAT IS NOT CONTROLLED WITH YOUR NAUSEA MEDICATION  *UNUSUAL SHORTNESS OF BREATH  *UNUSUAL BRUISING OR BLEEDING  TENDERNESS IN MOUTH AND THROAT WITH OR WITHOUT PRESENCE OF ULCERS  *URINARY PROBLEMS  *BOWEL PROBLEMS  UNUSUAL RASH Items with * indicate a potential emergency and should be followed up as soon as possible.  Feel free to call the clinic you have any questions or concerns. The clinic phone number is [phone].  Please show the [facility] at check-in to the Emergency Department and triage nurse.  Hypokalemia Hypokalemia means that the amount of potassium in the blood is lower than normal.Potassium is a chemical that helps regulate the amount of fluid in the body (electrolyte). It also stimulates muscle tightening (contraction) and helps nerves work properly.Normally, most of the body's potassium is inside of cells, and only a very small amount is in the blood. Because the amount in the blood is so small, minor changes to potassium levels in the blood can be life-threatening. What are the causes? This condition may be caused by:  Antibiotic medicine.  Diarrhea or vomiting. Taking too much of a medicine that helps you have a bowel movement (laxative) can cause diarrhea and lead to hypokalemia.  Chronic kidney disease (CKD).  Medicines that help the body get rid of excess fluid (diuretics).  Eating disorders, such as bulimia.  Low  magnesium levels in the body.  Sweating a lot.  What are the signs or symptoms? Symptoms of this condition include:  Weakness.  Constipation.  Fatigue.  Muscle cramps.  Mental confusion.  Skipped heartbeats or irregular heartbeat (palpitations).  Tingling or numbness.  How is this diagnosed? This condition is diagnosed with a blood test. How is this treated? Hypokalemia can be treated by taking potassium supplements by mouth or adjusting the medicines that you take. Treatment may also include eating more foods that contain a lot of potassium. If your potassium level is very low, you may need to get potassium through an IV tube in one of your veins and be monitored in the hospital. Follow these instructions at home:  Take over-the-counter and prescription medicines only as told by your health care provider. This includes vitamins and supplements.  Eat a healthy diet. A healthy diet includes fresh fruits and vegetables, whole grains, healthy fats, and lean proteins.  If instructed, eat more foods that contain a lot of potassium, such as: ? Nuts, such as peanuts and pistachios. ? Seeds, such as sunflower seeds and pumpkin seeds. ? Peas, lentils, and lima beans. ? Whole grain and bran cereals and breads. ? Fresh fruits and vegetables, such as apricots, avocado, bananas, cantaloupe, kiwi, oranges, tomatoes, asparagus, and potatoes. ? Orange juice. ? Tomato juice. ? Red meats. ? Yogurt.  Keep all follow-up visits as told by your health care provider. This is important. Contact a health care provider if:  You have weakness that gets worse.  You feel your heart pounding or racing.  You vomit.  You have diarrhea.  You have diabetes (diabetes mellitus) and you have trouble keeping your blood sugar (glucose) in your target range. Get help right away if:  You have chest pain.  You have shortness of breath.  You have vomiting or diarrhea that lasts for more than 2  days.  You faint. This information is not intended to replace advice given to you by your health care provider. Make sure you discuss any questions you have with your health care provider. Document Released: 12/29/2004 Document Revised: 08/17/2015 Document Reviewed: 08/17/2015 Elsevier Interactive Patient Education  2018 [employer].

## 2016-11-25 NOTE — Telephone Encounter (Signed)
Gave avs and calendar for December  °

## 2016-11-25 NOTE — Progress Notes (Signed)
Patient c/o pain in left hip and thigh.  She rates it a 10/10.  She took dilaudid 2mg  at about 11:45.   Discussed with Dr. Burr Medico.  Order given and read back for one percocet.  Given at 1519. 1604  Patient feels much better.  Facial expression is more relaxed.   She reports pain is down to a 6 or 7.
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
## 2016-11-27 ENCOUNTER — Telehealth: Payer: Self-pay

## 2016-11-27 NOTE — Telephone Encounter (Signed)
Pt called that her norco is >$21 and she doesn't have any money. The pharmacy said medicare won't cover hydrocodone. She is used to medications costing $3-4.  This RN called [facility] to clarify - the person answering phone says it is $3. Let pt know.

## 2016-12-02 ENCOUNTER — Encounter (INDEPENDENT_AMBULATORY_CARE_PROVIDER_SITE_OTHER): Payer: Self-pay | Admitting: Orthopaedic Surgery

## 2016-12-02 ENCOUNTER — Ambulatory Visit (INDEPENDENT_AMBULATORY_CARE_PROVIDER_SITE_OTHER): Payer: Medicaid Other | Admitting: Orthopaedic Surgery

## 2016-12-02 VITALS — BP 106/73 | HR 73

## 2016-12-02 DIAGNOSIS — C7951 Secondary malignant neoplasm of bone: Secondary | ICD-10-CM

## 2016-12-02 DIAGNOSIS — C50911 Malignant neoplasm of unspecified site of right female breast: Secondary | ICD-10-CM | POA: Diagnosis not present

## 2016-12-02 NOTE — Progress Notes (Signed)
Office Visit Note   Patient: [name]           Date of Birth: [birth year]           MRN: [id number] Visit Date: 12/02/2016              Requested by: [name], MD [id number] [place], [place] [address] PCP: [name], MD   Assessment & Plan: Visit Diagnoses:  1. [name], right ([place])          Postop from curettage of intramedullary lesion and prophylactic nailing with long trochanteric nail 06/26/2015.  Recent CT scan suggests a nondisplaced fracture by the inter-truck region.  This is probably related to 1 of her recent falls.  Plan: She needs to use her walker for fall prevention.  She has problems with chronic dizziness, vertigo, has narcotic pain medication for her metastatic bone pain.  All of these are risk factors for falls.  I discussed with her that the area where the nondisplaced crack is present should heal.  She can return if she has increased pain.  Hardware is in good position without evidence of loosening.  She can weight-bear as tolerated.  Follow-Up Instructions: Return if symptoms worsen or fail to improve.   Orders:  No orders of the defined types were placed in this encounter.  No orders of the defined types were placed in this encounter.     Procedures: No procedures performed   Clinical Data: No additional findings.   Subjective: Chief Complaint  Patient presents with  . Left Leg - Pain, Follow-up    HPI 51 year old female returns post prophylactic nailing of her hip for metastatic cancer in the intertrochanteric region.  She had some recent increased pain in her hip she has had some falling and had a CT scan which showed a possible for cortical crack at the lesser trochanter nondisplaced.  She still had the permeative lesion in the proximal left femur cortex consistent with osseous metastasis and had radiation treatment.  She has had a few falls recently and has her walker with her today.  She is not  fallen when she uses a walker.  Review of Systems review of systems updated and is unchanged.  She had a prescription for Norco also had Dilaudid.  She said some tingling in her leg most of its proximal in the left thigh.  No numbness or tingling in her foot.   Objective: Vital Signs: BP 106/73   Pulse 73   Physical Exam  Constitutional: She is oriented to person, place, and time. She appears well-developed.  HENT:  Head: Normocephalic.  Right Ear: External ear normal.  Left Ear: External ear normal.  Eyes: Pupils are equal, round, and reactive to light.  Neck: No tracheal deviation present. No thyromegaly present.  Cardiovascular: Normal rate.  Pulmonary/Chest: Effort normal.  Abdominal: Soft.  Neurological: She is alert and oriented to person, place, and time.  Skin: Skin is warm and dry.  Psychiatric: She has a normal mood and affect. Her behavior is normal.    Ortho Exam patient is amatory with a walker.  She has mild discomfort with internal and external rotation of her hip.  Specialty Comments:  No specialty comments available.  Imaging: CLINICAL DATA:  Left thigh pain. Evaluate for bone metastases. History of metastatic breast cancer.  EXAM: CT OF THE LOWER LEFT EXTREMITY WITHOUT CONTRAST  TECHNIQUE: Multidetector CT imaging of the lower left extremity  was performed according to the standard protocol.  COMPARISON:  PET CT dated May 20, 2016.  FINDINGS: Bones/Joint/Cartilage  Postsurgical changes related to prior cephalomedullary rod fixation of the left femur. No evidence of hardware complication. Slightly permeative appearance of the proximal left femoral cortex is largely unchanged. There is a linear lucency along the anterolateral proximal femoral cortex at the level of the lesser trochanter.  There is unchanged patchy lucency and sclerosis along the anterior acetabulum, which corresponds to faint uptake on prior PET-CT from May  2018.  Ligaments  Suboptimally assessed by CT.  Muscles and Tendons  No focal abnormality.  Soft tissues  Unremarkable.  IMPRESSION: 1. Postsurgical changes related to prior cephalomedullary rod fixation of the left femur with linear lucency along the anterolateral proximal femoral cortex at level of the lesser trochanter, suspicious for nondisplaced fracture. 2. Slightly permeative appearance of the proximal left femoral cortex at the site of known prior osseous metastases is largely unchanged. 3. Unchanged patchy lucency and sclerosis along the anterior acetabulum, which appears to correspond with an area of faint uptake on the prior PET-CT, suspicious for osseous metastasis. These results will be called to the ordering clinician or representative by the Radiologist Assistant, and communication documented in the PACS or zVision Dashboard.   Electronically Signed   By: Titus Dubin M.D.   On: 11/23/2016 19:07   PMFS History: Patient Active Problem List   Diagnosis Date Noted  . Bilateral thigh pain 11/19/2016  . Goals of care, counseling/discussion 03/05/2016  . MRSA (methicillin resistant Staphylococcus aureus) infection 01/18/2016  . Anemia due to antineoplastic chemotherapy 12/11/2015  . Lymphedema of left leg 12/11/2015  . Osteopenia of multiple sites 11/26/2015  . Chemotherapy-induced peripheral neuropathy ([place]) 11/03/2015  . Port catheter in place 09/26/2015  . Chemotherapy induced nausea and vomiting 08/13/2015  . Cancer related pain 08/13/2015  . [name], right ([place]) 07/14/2015  . S/P TRAM (transverse rectus abdominis muscle) flap breast reconstruction 07/14/2015  . Thyroid nodule 07/09/2015  . Depression   . Generalized anxiety disorder   . Esophageal reflux   . Genetic testing 04/04/2015  . Congenital deafness 02/26/2015  . Premature surgical menopause 02/26/2015  . Benign paroxysmal positional vertigo  07/24/2014  . Migraine 07/24/2014  . DIZZINESS, CHRONIC 01/25/2007  . Insomnia 01/25/2007   Past Medical History:  Diagnosis Date  . Abnormal Pap smear   . Anxiety   . Breast cancer ([place])   . Cancer ([place]) 1999   breast-s/p mastectomy, chemo, rad  . CIN I (cervical intraepithelial neoplasia I) 2003   by colpo  . Congenital deafness   . Deaf   . Depression   . Radiation 07/12/15-07/26/15   left femur 30 Gy  . S/P bilateral oophorectomy     Family History  Problem Relation Age of Onset  . Hypertension Mother   . Seizures Mother   . Alzheimer's disease Maternal Uncle   . Pancreatitis Maternal Grandmother   . Heart attack Maternal Grandfather     Past Surgical History:  Procedure Laterality Date  . ABDOMINAL HYSTERECTOMY    . INTRAMEDULLARY (IM) NAIL INTERTROCHANTERIC Left 06/26/2015   Procedure: LEFT BIOMET LONG AFFIXS NAIL;  Surgeon: [name], MD;  Location: [facility];  Service: Orthopedics;  Laterality: Left;  . IR GENERIC HISTORICAL  08/06/2015   IR US GUIDE VASC ACCESS LEFT 08/06/2015 Aletta Edouard, MD [facility]  . IR GENERIC HISTORICAL  08/06/2015   IR FLUORO GUIDE CV LINE  LEFT 08/06/2015 Aletta Edouard, MD [facility]  . IR GENERIC HISTORICAL  03/05/2016   IR CV LINE INJECTION 03/05/2016 Markus Daft, MD [facility]  . MASTECTOMY     right breast  . MASTECTOMY    . OVARIAN CYST REMOVAL    . RADIOLOGY WITH ANESTHESIA Left 06/25/2015   Procedure: MRI OF LEFT HIP WITH OR WITHOUT CONTRAST;  Surgeon: Medication Radiologist, MD;  Location: [facility];  Service: Radiology;  Laterality: Left;  DR. MCINTYRE/MRI  . TUBAL LIGATION     Social History   Occupational History  . Not on file  Tobacco Use  . Smoking status: Never Smoker  . Smokeless tobacco: Never Used  Substance and Sexual Activity  . Alcohol use: No  . Drug use: No  . Sexual activity: Yes    Birth control/protection: Surgical

## 2016-12-09 ENCOUNTER — Other Ambulatory Visit (HOSPITAL_COMMUNITY): Payer: Medicaid Other

## 2016-12-16 ENCOUNTER — Encounter: Payer: Self-pay | Admitting: Nurse Practitioner

## 2016-12-16 ENCOUNTER — Ambulatory Visit: Payer: Medicaid Other

## 2016-12-16 ENCOUNTER — Ambulatory Visit (HOSPITAL_BASED_OUTPATIENT_CLINIC_OR_DEPARTMENT_OTHER): Payer: Medicaid Other | Admitting: Nurse Practitioner

## 2016-12-16 ENCOUNTER — Other Ambulatory Visit (HOSPITAL_BASED_OUTPATIENT_CLINIC_OR_DEPARTMENT_OTHER): Payer: Medicaid Other

## 2016-12-16 ENCOUNTER — Ambulatory Visit (HOSPITAL_BASED_OUTPATIENT_CLINIC_OR_DEPARTMENT_OTHER): Payer: Medicaid Other

## 2016-12-16 ENCOUNTER — Telehealth: Payer: Self-pay | Admitting: Nurse Practitioner

## 2016-12-16 VITALS — BP 122/68 | HR 73 | Temp 97.7°F | Resp 18 | Ht 65.0 in | Wt 129.7 lb

## 2016-12-16 DIAGNOSIS — C7951 Secondary malignant neoplasm of bone: Secondary | ICD-10-CM

## 2016-12-16 DIAGNOSIS — R42 Dizziness and giddiness: Secondary | ICD-10-CM

## 2016-12-16 DIAGNOSIS — E876 Hypokalemia: Secondary | ICD-10-CM | POA: Diagnosis not present

## 2016-12-16 DIAGNOSIS — Z112 Encounter for screening for other bacterial diseases: Secondary | ICD-10-CM

## 2016-12-16 DIAGNOSIS — M25552 Pain in left hip: Secondary | ICD-10-CM

## 2016-12-16 DIAGNOSIS — Z170 Estrogen receptor positive status [ER+]: Secondary | ICD-10-CM

## 2016-12-16 DIAGNOSIS — C50911 Malignant neoplasm of unspecified site of right female breast: Secondary | ICD-10-CM

## 2016-12-16 DIAGNOSIS — M79605 Pain in left leg: Secondary | ICD-10-CM

## 2016-12-16 DIAGNOSIS — C50919 Malignant neoplasm of unspecified site of unspecified female breast: Secondary | ICD-10-CM

## 2016-12-16 DIAGNOSIS — G4700 Insomnia, unspecified: Secondary | ICD-10-CM

## 2016-12-16 DIAGNOSIS — M79672 Pain in left foot: Secondary | ICD-10-CM

## 2016-12-16 DIAGNOSIS — R600 Localized edema: Secondary | ICD-10-CM | POA: Diagnosis not present

## 2016-12-16 DIAGNOSIS — Z95828 Presence of other vascular implants and grafts: Secondary | ICD-10-CM

## 2016-12-16 DIAGNOSIS — C799 Secondary malignant neoplasm of unspecified site: Principal | ICD-10-CM

## 2016-12-16 LAB — CBC WITH DIFFERENTIAL/PLATELET
BASO%: 0.2 % (ref 0.0–2.0)
Basophils Absolute: 0 10*3/uL (ref 0.0–0.1)
EOS%: 3 % (ref 0.0–7.0)
Eosinophils Absolute: 0.1 10*3/uL (ref 0.0–0.5)
HEMATOCRIT: 35.8 % (ref 34.8–46.6)
HEMOGLOBIN: 12.1 g/dL (ref 11.6–15.9)
LYMPH#: 1.5 10*3/uL (ref 0.9–3.3)
LYMPH%: 34.6 % (ref 14.0–49.7)
MCH: 29.6 pg (ref 25.1–34.0)
MCHC: 33.8 g/dL (ref 31.5–36.0)
MCV: 87.5 fL (ref 79.5–101.0)
MONO#: 0.3 10*3/uL (ref 0.1–0.9)
MONO%: 7.1 % (ref 0.0–14.0)
NEUT%: 55.1 % (ref 38.4–76.8)
NEUTROS ABS: 2.4 10*3/uL (ref 1.5–6.5)
Platelets: 142 10*3/uL — ABNORMAL LOW (ref 145–400)
RBC: 4.09 10*6/uL (ref 3.70–5.45)
RDW: 13.1 % (ref 11.2–14.5)
WBC: 4.3 10*3/uL (ref 3.9–10.3)

## 2016-12-16 LAB — COMPREHENSIVE METABOLIC PANEL
ALBUMIN: 3.6 g/dL (ref 3.5–5.0)
ALK PHOS: 63 U/L (ref 40–150)
ALT: 24 U/L (ref 0–55)
ANION GAP: 11 meq/L (ref 3–11)
AST: 25 U/L (ref 5–34)
BILIRUBIN TOTAL: 1.3 mg/dL — AB (ref 0.20–1.20)
BUN: 8.1 mg/dL (ref 7.0–26.0)
CO2: 24 mEq/L (ref 22–29)
Calcium: 9.5 mg/dL (ref 8.4–10.4)
Chloride: 106 mEq/L (ref 98–109)
Creatinine: 0.9 mg/dL (ref 0.6–1.1)
Glucose: 108 mg/dl (ref 70–140)
POTASSIUM: 3.2 meq/L — AB (ref 3.5–5.1)
Sodium: 142 mEq/L (ref 136–145)
TOTAL PROTEIN: 7.4 g/dL (ref 6.4–8.3)

## 2016-12-16 MED ORDER — DIVALPROEX SODIUM 250 MG PO DR TAB: 250.0000 mg | DELAYED_RELEASE_TABLET | Freq: Every day | ORAL | 2 refills | Status: DC

## 2016-12-16 MED ORDER — SODIUM CHLORIDE 0.9 % IJ SOLN
10.0000 mL | INTRAMUSCULAR | Status: DC | PRN
  Administered 2016-12-16: 10 mL via INTRAVENOUS
  Filled 2016-12-16: qty 10

## 2016-12-16 MED ORDER — POTASSIUM CHLORIDE CRYS ER 20 MEQ PO TBCR: EXTENDED_RELEASE_TABLET | ORAL | 1 refills | Status: DC

## 2016-12-16 MED ORDER — SODIUM CHLORIDE 0.9 % IV SOLN
3.4000 mg/kg | Freq: Once | INTRAVENOUS | Status: AC
  Administered 2016-12-16: 200 mg via INTRAVENOUS
  Filled 2016-12-16: qty 10

## 2016-12-16 MED ORDER — SODIUM CHLORIDE 0.9% FLUSH
10.0000 mL | INTRAVENOUS | Status: DC | PRN
  Administered 2016-12-16: 10 mL
  Filled 2016-12-16: qty 10

## 2016-12-16 MED ORDER — ACETAMINOPHEN 325 MG PO TABS
650.0000 mg | ORAL_TABLET | Freq: Once | ORAL | Status: AC
  Administered 2016-12-16: 650 mg via ORAL

## 2016-12-16 MED ORDER — ACETAMINOPHEN 325 MG PO TABS
ORAL_TABLET | ORAL | Status: AC
Start: 2016-12-16 — End: ?
  Filled 2016-12-16: qty 2

## 2016-12-16 MED ORDER — HEPARIN SOD (PORK) LOCK FLUSH 100 UNIT/ML IV SOLN
500.0000 [IU] | Freq: Once | INTRAVENOUS | Status: AC | PRN
  Administered 2016-12-16: 500 [IU]
  Filled 2016-12-16: qty 5

## 2016-12-16 MED ORDER — SODIUM CHLORIDE 0.9 % IV SOLN
Freq: Once | INTRAVENOUS | Status: AC
  Administered 2016-12-16: 14:00:00 via INTRAVENOUS

## 2016-12-16 NOTE — Progress Notes (Signed)
Per Lacie NP, OK to treat without latest echo and elevated bilirubin.   Wylene Simmer, BSN, RN 12/16/2016 1:17 PM

## 2016-12-16 NOTE — Telephone Encounter (Signed)
Scheduled appt per 12/5 los - Gave patient AVS and calender per los.   

## 2016-12-16 NOTE — Patient Instructions (Signed)
[facility] Discharge Instructions for Patients Receiving Chemotherapy  Today you received the following chemotherapy agents: Kadcyla  To help prevent nausea and vomiting after your treatment, we encourage you to take your nausea medication as ordered per md   If you develop nausea and vomiting that is not controlled by your nausea medication, call the clinic.   BELOW ARE SYMPTOMS THAT SHOULD BE REPORTED IMMEDIATELY:  *FEVER GREATER THAN 100.5 F  *CHILLS WITH OR WITHOUT FEVER  NAUSEA AND VOMITING THAT IS NOT CONTROLLED WITH YOUR NAUSEA MEDICATION  *UNUSUAL SHORTNESS OF BREATH  *UNUSUAL BRUISING OR BLEEDING  TENDERNESS IN MOUTH AND THROAT WITH OR WITHOUT PRESENCE OF ULCERS  *URINARY PROBLEMS  *BOWEL PROBLEMS  UNUSUAL RASH Items with * indicate a potential emergency and should be followed up as soon as possible.  Feel free to call the clinic you have any questions or concerns. The clinic phone number is [phone].  Please show the [facility] at check-in to the Emergency Department and triage nurse.

## 2016-12-16 NOTE — Progress Notes (Addendum)
[name]  [phone] [phone]  Clinic Follow up Note   Patient Care Team: [name], Pete Pelt, MD as PCP - General (Family Medicine) [name], MD as Consulting Physician (Orthopedic Surgery) 12/16/2016  CHIEF COMPLAINT: F/u breast cancer   SUMMARY OF ONCOLOGIC HISTORY:   [name], right ([place])   11/1997 Cancer Diagnosis    Patient had 1.4 cm poorly differentiated right breast carcinoma diagnosed in Nov 1999 at age 82, 1/7 axillary nodes involved, ER/PR and HER 2 positive. She had mastectomy with the 7 axillary node evaluation, 4 cycles of adriamycin/ cytoxan followed by taxotere, then five years of tamoxifen thru June 2005 (no herceptin in 1999). She had bilateral oophorectomy in May 2005. She was briefly on aromatase inhibitor after tamoxifen, but discontinued this herself due to poor tolerance.      04/04/2015 Genetic Testing    Negative genetic testing on the breast/ovarian cancer pnael.  The Breast/Ovarian gene panel offered by [facility] includes sequencing and rearrangement analysis for the following 20 genes:  ATM, BARD1, BRCA1, BRCA2, BRIP1, CDH1, CHEK2, EPCAM, FANCC, MLH1, MSH2, MSH6, NBN, PALB2, PMS2, PTEN, RAD51C, RAD51D, TP53, and XRCC2.      06/26/2015 Pathology Results    Initial Path Report Bone, curettage, Left femur met, intramedullary subtroch tissue - METASTATIC ADENOCARCINOMA.  Estrogen Receptor: 95%, POSITIVE, STRONG STAINING INTENSITY Progesterone Receptor: 2%, POSITIVE, STRONG  HER2 (+), IHC 3+      06/26/2015 Surgery    Biopsy of metastatic tissue and stabilization with Affixus trochanteric nail, proximal and distal interlock of left femur by Dr. [name]       06/28/2015 Imaging    CT Chest w/ Contrast 1. Extensive right internal mammary chain lymphadenopathy consistent with metastatic breast cancer. 2. Lytic metastases involving the posterior elements at T1 with probable nondisplaced  pathologic fracture of the spinous process. 3. No other evidence of thoracic metastatic disease. 4. Trace bilateral pleural effusions with associated bibasilar atelectasis. 5. Indeterminate right thyroid nodule, not previously imaged. This could be evaluated with thyroid ultrasound as clinically warranted.      07/01/2015 Imaging    Bone Scan 1. Increased activity noted throughout the left femur. Although metastatic disease cannot be excluded, these changes are most likely secondary to prior surgery. 2. No other focal abnormalities identified to suggest metastatic disease.      07/12/2015 - 07/26/2015 Radiation Therapy    Left femur, 30 Gy in 10 fractions by Dr. Sondra Come       07/14/2015 Initial Diagnosis    [name], right ([place])      07/19/2015 Imaging    Initial PET Scan 1. Severe multifocal osseous metastatic disease, much greater than anticipated based on prior imaging. 2. Multifocal nodal metastases involving the right internal mammary, prevascular and subpectoral lymph nodes. No axillary pulmonary involvement identified. 3. No distant extra osseous metastases.      07/30/2015 - 11/29/2015 Chemotherapy    Docetaxel, Herceptin and Perjeta every 3 weeks X6 cycles, pt tolerated moderately well, restaging scan showed excellent response       09/18/2015 Imaging    CT Head w/wo Contrast 1. No acute intracranial abnormality or significant interval change. 2. Stable minimal periventricular white matter hypoattenuation on the right. This may reflect a remote ischemic injury. 3. No evidence for metastatic disease to the brain. 4. No focal soft tissue lesion to explain the patient's tenderness.      10/30/2015 Imaging  CT Abdomen Pelvis w/ Contrast 1. Few mildly prominent fluid-filled loops of small bowel scattered throughout the abdomen, with intraluminal fluid density within the distal colon. Given the provided history, findings are suggestive of acute  enteritis/diarrheal illness. 2. No other acute intra-abdominal or pelvic process identified. 3. Widespread osseous metastatic disease, better evaluated on most recent PET-CT from 07/19/2015. No pathologic fracture or other complication. 4. 11 mm hypodensity within the left kidney. This lesion measures intermediate density, and is indeterminate. While this lesion is similar in size relative to recent studies, this is increased in size relative to prior study from 2012. Further evaluation with dedicated renal mass protocol CT and/or MRI is recommended for complete characterization.      12/25/2015 Imaging    Restaging PET Scan 1. Markedly improved skeletal activity, with only several faint foci of residual accentuated metabolic activity, but resolution of the vast majority of the previously extensive osseous metastatic disease. 2. Resolution of the prior right internal mammary and right prevascular lymph nodes. 3. Residual subcutaneous edema and edema along fascia planes in the left upper thigh. This remains somewhat more than I would expect for placement of an IM nail 6 weeks ago. If there is leg swelling further down, consider Doppler venous ultrasound to rule out left lower extremity DVT. I do not perceive an obvious difference in density between the pelvic and common femoral veins on the noncontrast CT data. 4. Chronic right maxillary and right sphenoid sinusitis.      12/2015 -  Chemotherapy    Maintenance Herceptin and pejeta every 3 weeks      01/16/2016 -  Anti-estrogen oral therapy    Letrozole 2.5 mg daily      03/01/2016 Miscellaneous    Patient presented to ED following a fall; she reports she landed on her back and is now experiencing back pain. The patient was evaluated and discharge home same day with pain control.      05/01/2016 Imaging    CT CAP IMPRESSION: 1. Stable exam.  No new or progressive disease identified. 2. No evidence for residual or recurrent adenopathy within  the chest. 3. Stable bone metastasis.      05/01/2016 Imaging    BONE SCAN IMPRESSION: 1. Small focal uptake involving the anterior rib ends of the left second and right fourth ribs, new since the prior bone scan. The location of this uptake is more suggestive of a traumatic or inflammatory etiology as opposed to metastatic disease. No other evidence of metastatic disease. 2. There are other areas of stable uptake which are most likely degenerative/reactive, including right shoulder and cervical spine uptake and uptake along the right femur adjacent to the ORIF hardware.      05/20/2016 Imaging    NM PET Skull to Thigh IMPRESSION: 1. There multiple metastatic foci in the bony pelvis, including some new abnormal foci compare to the prior PET-CT, compatible with mildly progressive bony metastatic disease. Also, a left T11 vertebral hypermetabolic metastatic lesion is observed, new compared to the prior exam. 2. No active extraosseous malignancy is currently identified. 3. Right anterior fourth rib healing fracture, appears be      06/04/2016 -  Chemotherapy    Second line chemotherapy Kadcyla every 3 weeks started on 06/04/16       10/05/2016 Imaging    CT CAP and Bone Scan:  Bones: left intramedullary rod and left femoral head neck screw in place. In the proximal diaphysis of the left femur there is cortical thickening and  irregularity. No lytic or blastic bone lesions. No fracture or vertebral endplate destruction.   No definite evidence of recurrent disease in the chest, abdomen, or pelvis.      10/28/2016 Imaging    CT A/P IMPRESSION: No acute process demonstrated in the abdomen or pelvis. No evidence of bowel obstruction or inflammation.      11/04/2016 Imaging    DG foot complete left: IMPRESSION: No fracture or dislocation.  No soft tissue abnormality      11/23/2016 Imaging    CT RIGHT FEMUR IMPRESSION: Negative CT scan of the right thigh.  No visible  metastatic disease.  CT LEFT FEMUR IMPRESSION: 1. Postsurgical changes related to prior cephalomedullary rod fixation of the left femur with linear lucency along the anterolateral proximal femoral cortex at level of the lesser trochanter, suspicious for nondisplaced fracture. 2. Slightly permeative appearance of the proximal left femoral cortex at the site of known prior osseous metastases is largely unchanged. 3. Unchanged patchy lucency and sclerosis along the anterior acetabulum, which appears to correspond with an area of faint uptake on the prior PET-CT, suspicious for osseous metastasis. These results will be called to the ordering clinician or representative by the Radiologist Assistant, and communication documented in the PACS or zVision Dashboard.       CURRENT THERAPY:Letrozole 2.41m daily, second line Kadcyla every 3 weeks started on 06/04/16; zometamonthly (changed to q6 weeks on 12/16/16)  INTERVAL HISTORY: Ms. EStokesreturns for f/u as scheduled prior to next cycle kadcyla. She received kadcyla and zometa on 11/25/16. She tolerated well, denies fatigue, fever, chills, nausea, diarrhea. She has had fluctuating mood lately, her appetite decreases when she feels down. Denies hip or leg pain at this time; she saw Dr. YLorin Mercyon 12/02/16. Her only complaint is left ankle and foot pain, 2-3/10 and increases when walking. Denies hip or leg pain. She requires dilaudid rarely, occasionally at night. Does not need refills today. Continues letrozole daily.   REVIEW OF SYSTEMS:   Constitutional: Denies fatigue, fevers, chills (+) 3 lbs weight loss in 3 weeks Eyes: Denies blurriness of vision Ears, nose, mouth, throat, and face: Denies mucositis or sore throat Respiratory: Denies cough, dyspnea or wheezes Cardiovascular: Denies palpitation, chest discomfort or lower extremity swelling Gastrointestinal:  Denies nausea, vomiting, constipation, diarrhea, heartburn or change in bowel  habits Skin: Denies abnormal skin rashes Lymphatics: Denies new lymphadenopathy or easy bruising Neurological:Denies numbness, tingling or new weaknesses Behavioral/Psych: (+) Mood fluctuates, mild depression All other systems were reviewed with the patient and are negative.  MEDICAL HISTORY:  Past Medical History:  Diagnosis Date  . Abnormal Pap smear   . Anxiety   . Breast cancer [place]   . [place]   breast-s/p mastectomy, chemo, rad  . CIN I (cervical intraepithelial neoplasia I) 2003   by colpo  . Congenital deafness   . Deaf   . Depression   . Radiation 07/12/15-07/26/15   left femur 30 Gy  . S/P bilateral oophorectomy     SURGICAL HISTORY: Past Surgical History:  Procedure Laterality Date  . ABDOMINAL HYSTERECTOMY    . INTRAMEDULLARY (IM) NAIL INTERTROCHANTERIC Left 06/26/2015   Procedure: LEFT BIOMET LONG AFFIXS NAIL;  Surgeon: MMarybelle Killings MD;  [facility]  Service: Orthopedics;  Laterality: Left;  . IR GENERIC HISTORICAL  08/06/2015   IR [place] LEFT 08/06/2015 GAletta Edouard MD [facility]  . IR GENERIC HISTORICAL  08/06/2015   IR FLUORO GUIDE CV LINE LEFT  08/06/2015 Aletta Edouard, MD [facility]  . IR GENERIC HISTORICAL  03/05/2016   IR CV LINE INJECTION 03/05/2016 Markus Daft, MD [facility]  . MASTECTOMY     right breast  . MASTECTOMY    . OVARIAN CYST REMOVAL    . RADIOLOGY WITH ANESTHESIA Left 06/25/2015   Procedure: MRI OF LEFT HIP WITH OR WITHOUT CONTRAST;  Surgeon: Medication Radiologist, MD;  Location: [facility];  Service: Radiology;  Laterality: Left;  DR. MCINTYRE/MRI  . TUBAL LIGATION      I have reviewed the social history and family history with the patient and they are unchanged from previous note.  ALLERGIES:  is allergic to trazodone and nefazodone; chlorhexidine; promethazine hcl; and diphenhydramine hcl.  MEDICATIONS:  Current Outpatient Medications  Medication Sig Dispense Refill  . Cholecalciferol 2000 units CAPS  Take 1 capsule by mouth daily.    . diazepam (VALIUM) 5 MG tablet Take 1 tablet (5 mg total) at bedtime as needed by mouth for anxiety. 30 tablet 0  . divalproex (DEPAKOTE) 250 MG DR tablet Take 1 tablet (250 mg total) by mouth at bedtime. 30 tablet 2  . HYDROcodone-acetaminophen (NORCO) 5-325 MG tablet Take 1-2 tablets every 6 (six) hours as needed by mouth for moderate pain. 45 tablet 0  . HYDROmorphone (DILAUDID) 2 MG tablet Take 1 tablet (2 mg total) every 4 (four) hours as needed by mouth for severe pain. 60 tablet 0  . letrozole (FEMARA) 2.5 MG tablet Take 1 tablet (2.5 mg total) daily by mouth. 30 tablet 2  . lidocaine-prilocaine (EMLA) cream Apply 1 application as needed topically. Apply to Porta-Cath 1-2 hours prior to access as directed. 30 g 2  . traZODone (DESYREL) 50 MG tablet Take 1 tablet (50 mg total) at bedtime as needed by mouth for sleep. 30 tablet 3  . ondansetron (ZOFRAN) 8 MG tablet Take 1 tablet (8 mg total) by mouth every 8 (eight) hours as needed for nausea or vomiting. (Patient not taking: Reported on 12/16/2016) 30 tablet 0  . potassium chloride SA (K-DUR,KLOR-CON) 20 MEQ tablet Take 2 tablets daily for 1 week then take 1 tablet daily 40 tablet 1  . prochlorperazine (COMPAZINE) 10 MG tablet Take 1 tablet (10 mg total) by mouth every 6 (six) hours as needed for nausea or vomiting. (Patient not taking: Reported on 12/16/2016) 30 tablet 2   No current facility-administered medications for this visit.    Facility-Administered Medications Ordered in Other Visits  Medication Dose Route Frequency Provider Last Rate Last Dose  . sodium chloride 0.9 % 1,000 mL with potassium chloride 10 mEq infusion   Intravenous Continuous Gordy Levan, MD   Stopped at 09/10/15 1653  . sodium chloride 0.9 % injection 10 mL  10 mL Intravenous PRN Livesay, Lennis P, MD      . sodium chloride flush (NS) 0.9 % injection 10 mL  10 mL Intracatheter PRN Truitt Merle, MD   10 mL at 12/16/16 1503     PHYSICAL EXAMINATION: ECOG PERFORMANCE STATUS: 1 - Symptomatic but completely ambulatory BP 122/68 (BP Location: Left Arm, Patient Position: Sitting)   Pulse 73   Temp 97.7 F (36.5 C) (Oral)   Resp 18   Ht 5' 5"  (1.651 m)   Wt 129 lb 11.2 oz (58.8 kg)   SpO2 100%   BMI 21.58 kg/m    GENERAL:alert, no distress and comfortable SKIN: skin color, texture, turgor are normal, no rashes or significant lesions EYES: normal, Conjunctiva are pink  and non-injected, sclera clear OROPHARYNX:no exudate, no erythema and lips, buccal mucosa, and tongue normal  NECK: supple, thyroid normal size, non-tender, without nodularity LYMPH:  no palpable cervical, supraclavicular, or axillary lymphadenopathy LUNGS: clear to auscultation bilaterally with normal breathing effort HEART: regular rate & rhythm and no murmurs and no lower extremity edema ABDOMEN:abdomen soft, non-tender and normal bowel sounds. No palpable hepatomegaly Musculoskeletal:no cyanosis of digits and no clubbing  NEURO: alert & oriented x 3 with fluent speech, no focal motor/sensory deficits  LABORATORY DATA:  I have reviewed the data as listed CBC Latest Ref Rng & Units 12/16/2016 11/25/2016 11/05/2016  WBC 3.9 - 10.3 10e3/uL 4.3 5.1 5.9  Hemoglobin 11.6 - 15.9 g/dL 12.1 11.8 12.0  Hematocrit 34.8 - 46.6 % 35.8 35.4 35.2(L)  Platelets 145 - 400 10e3/uL 142(L) 167 165    CMP Latest Ref Rng & Units 12/16/2016 11/25/2016 11/05/2016  Glucose 70 - 140 mg/dl 108 110 90  BUN 7.0 - 26.0 mg/dL 8.1 7.6 6  Creatinine 0.6 - 1.1 mg/dL 0.9 0.8 0.63  Sodium 136 - 145 mEq/L 142 142 136  Potassium 3.5 - 5.1 mEq/L 3.2(L) 2.9(LL) 3.2(L)  Chloride 101 - 111 mmol/L - - 101  CO2 22 - 29 mEq/L 24 26 26   Calcium 8.4 - 10.4 mg/dL 9.5 9.5 8.9  Total Protein 6.4 - 8.3 g/dL 7.4 7.8 6.6  Total Bilirubin 0.20 - 1.20 mg/dL 1.30(H) 0.95 0.7  Alkaline Phos 40 - 150 U/L 63 69 62  AST 5 - 34 U/L 25 24 54(H)  ALT 0 - 55 U/L 24 29 50     RADIOGRAPHIC  STUDIES: I have personally reviewed the radiological images as listed and agreed with the findings in the report. No results found.   ASSESSMENT & PLAN: [name] is a 51 y.o.postmenopause female with:  1. Metastatic right breast cancer to bone, ER+ HER 2+ 2. History of MRSA skin infection scalp, [place] at low back scalp and upper lip 3. Congenital deafness 4. Premature surgical menopause 5. Intermittent anemia 6. Hypokalemia 7. Goals of care discussion 8. Left leg pain, left hip pain, left foot pain 9. Left gum/jaw edema 10. Insomnia 11. Relocation 12. Vertigo  Ms. Mclean appears stable today, tolerating kadcyla well overall. Her pain appears to be well-managed on current regimen. I encouraged her to use norco only and dilaudid only if pain is severe. She is sleeping well with better pain control, continues to use valium and trazadone for sleep. I stressed the importance of avoiding dilaudid and valium together especially since she has h/o falls; she communicates that she understands. VS, weight, and labs are stable. K mildly decreased to 3.2. She does not tolerate liquid potassium. I changed back to tablet; she will take 40 mEq daily x1 week then 20 mEq daily. I sent in new Rx. Mild hyperbilirubinemia, 1.3; may be related to kadcyla. Has been transiently elevated in the past. Does not require dosage adjustment, will continue to monitor. Stable EF, 54% in 09/2016 per report from [place] state, will have repeat Echo 12/17/16. She will proceed with kadcyla today, will obtain PET scan prior to next cycle. She has been on zometa for almost 1 year; Dr. Burr Medico recommends change to q6 weeks for now with plan to decrease to q3 months in the near future. Return in 3 weeks for f/u and next cycle with PET scan few days prior.    PLAN: norco for pain; dilaudid only if severe; avoid use with valium D/c liquid  K, change to tablet; take 40 mEq daily x1 week then take 20 mEq daily Labs reviewed,  proceed with kadcyla today and q3 weeks zometa changed to q6 weeks per Dr. Burr Medico; discussed with pharmacy Echo 12/17/16 Return in 3 weeks for kadcyla and zometa; PET scan few days prior (order placed today)  All questions were answered. The patient knows to call the clinic with any problems, questions or concerns. No barriers to learning was detected.     Debra Feeling, NP 12/16/16

## 2016-12-17 ENCOUNTER — Ambulatory Visit (HOSPITAL_COMMUNITY)
Admission: RE | Admit: 2016-12-17 | Discharge: 2016-12-17 | Disposition: A | Discharge status: Home / Self Care | Payer: Medicaid Other | Source: Ambulatory Visit | Attending: Nurse Practitioner | Admitting: Nurse Practitioner

## 2016-12-17 DIAGNOSIS — C7951 Secondary malignant neoplasm of bone: Secondary | ICD-10-CM | POA: Diagnosis not present

## 2016-12-17 DIAGNOSIS — C50911 Malignant neoplasm of unspecified site of right female breast: Secondary | ICD-10-CM | POA: Insufficient documentation

## 2016-12-17 LAB — CANCER ANTIGEN 27.29: CA 27.29: 42.3 U/mL — ABNORMAL HIGH (ref 0.0–38.6)

## 2016-12-17 NOTE — Progress Notes (Signed)
  Echocardiogram 2D Echocardiogram has been performed.  [name] 12/17/2016, 3:42 PM

## 2016-12-18 ENCOUNTER — Other Ambulatory Visit: Payer: Self-pay | Admitting: Internal Medicine

## 2016-12-18 DIAGNOSIS — G4700 Insomnia, unspecified: Secondary | ICD-10-CM

## 2016-12-18 NOTE — Telephone Encounter (Signed)
Prescription placed at the front desk for pickup

## 2016-12-18 NOTE — Telephone Encounter (Signed)
Needs refill on valium.  Please contact pt when Rx is ready for piclup

## 2016-12-22 NOTE — Telephone Encounter (Signed)
Patient informed.  Sallyann Kinnaird,CMA  

## 2017-01-04 ENCOUNTER — Encounter: Payer: Self-pay | Admitting: Nurse Practitioner

## 2017-01-04 NOTE — Progress Notes (Signed)
PET scan Medicaid approval code [id number].   I scheduled PET for patient, called her and discussed location/date/time and NPO instructions via telephone with interpreter services. She agrees. I mailed directions, instructions, and date/time information. I called [employer] to request female sign language interpreter on patient's behalf.

## 2017-01-07 ENCOUNTER — Ambulatory Visit
Admission: RE | Admit: 2017-01-07 | Discharge: 2017-01-07 | Disposition: A | Discharge status: Home / Self Care | Payer: Medicaid Other | Source: Ambulatory Visit | Attending: Nurse Practitioner | Admitting: Nurse Practitioner

## 2017-01-07 DIAGNOSIS — C50911 Malignant neoplasm of unspecified site of right female breast: Secondary | ICD-10-CM

## 2017-01-07 DIAGNOSIS — C7951 Secondary malignant neoplasm of bone: Principal | ICD-10-CM

## 2017-01-08 ENCOUNTER — Ambulatory Visit (HOSPITAL_BASED_OUTPATIENT_CLINIC_OR_DEPARTMENT_OTHER): Payer: Medicaid Other | Admitting: Hematology

## 2017-01-08 ENCOUNTER — Telehealth: Payer: Self-pay | Admitting: Hematology

## 2017-01-08 ENCOUNTER — Ambulatory Visit (HOSPITAL_BASED_OUTPATIENT_CLINIC_OR_DEPARTMENT_OTHER): Payer: Medicaid Other

## 2017-01-08 ENCOUNTER — Other Ambulatory Visit (HOSPITAL_BASED_OUTPATIENT_CLINIC_OR_DEPARTMENT_OTHER): Payer: Medicaid Other

## 2017-01-08 ENCOUNTER — Encounter: Payer: Self-pay | Admitting: Hematology

## 2017-01-08 ENCOUNTER — Ambulatory Visit: Payer: Medicaid Other

## 2017-01-08 VITALS — BP 114/57 | HR 63 | Temp 97.8°F | Resp 18 | Ht 65.0 in | Wt 128.8 lb

## 2017-01-08 VITALS — BP 108/52 | HR 69 | Temp 98.0°F | Resp 17

## 2017-01-08 DIAGNOSIS — E876 Hypokalemia: Secondary | ICD-10-CM

## 2017-01-08 DIAGNOSIS — G4700 Insomnia, unspecified: Secondary | ICD-10-CM

## 2017-01-08 DIAGNOSIS — C799 Secondary malignant neoplasm of unspecified site: Principal | ICD-10-CM

## 2017-01-08 DIAGNOSIS — C50911 Malignant neoplasm of unspecified site of right female breast: Secondary | ICD-10-CM

## 2017-01-08 DIAGNOSIS — C7951 Secondary malignant neoplasm of bone: Secondary | ICD-10-CM

## 2017-01-08 DIAGNOSIS — Z170 Estrogen receptor positive status [ER+]: Secondary | ICD-10-CM

## 2017-01-08 DIAGNOSIS — H905 Unspecified sensorineural hearing loss: Secondary | ICD-10-CM

## 2017-01-08 DIAGNOSIS — G893 Neoplasm related pain (acute) (chronic): Secondary | ICD-10-CM

## 2017-01-08 DIAGNOSIS — Z95828 Presence of other vascular implants and grafts: Secondary | ICD-10-CM

## 2017-01-08 DIAGNOSIS — D649 Anemia, unspecified: Secondary | ICD-10-CM

## 2017-01-08 DIAGNOSIS — Z5112 Encounter for antineoplastic immunotherapy: Secondary | ICD-10-CM

## 2017-01-08 DIAGNOSIS — C50919 Malignant neoplasm of unspecified site of unspecified female breast: Secondary | ICD-10-CM

## 2017-01-08 LAB — CBC WITH DIFFERENTIAL/PLATELET
BASO%: 0.4 % (ref 0.0–2.0)
BASOS ABS: 0 10*3/uL (ref 0.0–0.1)
EOS ABS: 0.1 10*3/uL (ref 0.0–0.5)
EOS%: 2.6 % (ref 0.0–7.0)
HEMATOCRIT: 34.8 % (ref 34.8–46.6)
HEMOGLOBIN: 11.9 g/dL (ref 11.6–15.9)
LYMPH#: 2 10*3/uL (ref 0.9–3.3)
LYMPH%: 42.8 % (ref 14.0–49.7)
MCH: 29 pg (ref 25.1–34.0)
MCHC: 34 g/dL (ref 31.5–36.0)
MCV: 85.2 fL (ref 79.5–101.0)
MONO#: 0.3 10*3/uL (ref 0.1–0.9)
MONO%: 7 % (ref 0.0–14.0)
NEUT#: 2.2 10*3/uL (ref 1.5–6.5)
NEUT%: 47.2 % (ref 38.4–76.8)
PLATELETS: 155 10*3/uL (ref 145–400)
RBC: 4.09 10*6/uL (ref 3.70–5.45)
RDW: 13.5 % (ref 11.2–14.5)
WBC: 4.7 10*3/uL (ref 3.9–10.3)

## 2017-01-08 LAB — COMPREHENSIVE METABOLIC PANEL
ALBUMIN: 3.4 g/dL — AB (ref 3.5–5.0)
ALK PHOS: 64 U/L (ref 40–150)
ALT: 17 U/L (ref 0–55)
ANION GAP: 10 meq/L (ref 3–11)
AST: 21 U/L (ref 5–34)
BUN: 10.1 mg/dL (ref 7.0–26.0)
CALCIUM: 9.2 mg/dL (ref 8.4–10.4)
CO2: 26 mEq/L (ref 22–29)
Chloride: 105 mEq/L (ref 98–109)
Creatinine: 0.9 mg/dL (ref 0.6–1.1)
Glucose: 78 mg/dl (ref 70–140)
POTASSIUM: 3.3 meq/L — AB (ref 3.5–5.1)
Sodium: 141 mEq/L (ref 136–145)
Total Bilirubin: 0.95 mg/dL (ref 0.20–1.20)
Total Protein: 7.1 g/dL (ref 6.4–8.3)

## 2017-01-08 MED ORDER — SODIUM CHLORIDE 0.9 % IJ SOLN
10.0000 mL | INTRAMUSCULAR | Status: DC | PRN
  Administered 2017-01-08: 10 mL via INTRAVENOUS
  Filled 2017-01-08: qty 10

## 2017-01-08 MED ORDER — ACETAMINOPHEN 325 MG PO TABS
650.0000 mg | ORAL_TABLET | Freq: Once | ORAL | Status: AC
Start: 2017-01-08 — End: 2017-01-08
  Administered 2017-01-08: 650 mg via ORAL

## 2017-01-08 MED ORDER — HEPARIN SOD (PORK) LOCK FLUSH 100 UNIT/ML IV SOLN
500.0000 [IU] | Freq: Once | INTRAVENOUS | Status: AC | PRN
  Administered 2017-01-08: 500 [IU]
  Filled 2017-01-08: qty 5

## 2017-01-08 MED ORDER — SODIUM CHLORIDE 0.9 % IV SOLN
3.4000 mg/kg | Freq: Once | INTRAVENOUS | Status: AC
  Administered 2017-01-08: 200 mg via INTRAVENOUS
  Filled 2017-01-08: qty 10

## 2017-01-08 MED ORDER — SODIUM CHLORIDE 0.9% FLUSH
10.0000 mL | INTRAVENOUS | Status: DC | PRN
  Administered 2017-01-08: 10 mL
  Filled 2017-01-08: qty 10

## 2017-01-08 MED ORDER — ZOLEDRONIC ACID 4 MG/100ML IV SOLN
4.0000 mg | Freq: Once | INTRAVENOUS | Status: AC
  Administered 2017-01-08: 4 mg via INTRAVENOUS
  Filled 2017-01-08: qty 100

## 2017-01-08 MED ORDER — SODIUM CHLORIDE 0.9 % IV SOLN
Freq: Once | INTRAVENOUS | Status: AC
  Administered 2017-01-08: 12:00:00 via INTRAVENOUS

## 2017-01-08 MED ORDER — ACETAMINOPHEN 325 MG PO TABS
ORAL_TABLET | ORAL | Status: AC
  Filled 2017-01-08: qty 2

## 2017-01-08 NOTE — Telephone Encounter (Signed)
Gave patient avs and calendar with appts per 12/28 los. Called and rescheduled pet scan for patient.

## 2017-01-08 NOTE — Patient Instructions (Signed)
[name] Discharge Instructions for Patients Receiving Chemotherapy  Today you received the following chemotherapy agents Kadcyla; Zometa   To help prevent nausea and vomiting after your treatment, we encourage you to take your nausea medication as directed   If you develop nausea and vomiting that is not controlled by your nausea medication, call the clinic.   BELOW ARE SYMPTOMS THAT SHOULD BE REPORTED IMMEDIATELY:  *FEVER GREATER THAN 100.5 F  *CHILLS WITH OR WITHOUT FEVER  NAUSEA AND VOMITING THAT IS NOT CONTROLLED WITH YOUR NAUSEA MEDICATION  *UNUSUAL SHORTNESS OF BREATH  *UNUSUAL BRUISING OR BLEEDING  TENDERNESS IN MOUTH AND THROAT WITH OR WITHOUT PRESENCE OF ULCERS  *URINARY PROBLEMS  *BOWEL PROBLEMS  UNUSUAL RASH Items with * indicate a potential emergency and should be followed up as soon as possible.  Feel free to call the clinic should you have any questions or concerns. The clinic phone number is [phone].  Please show the [name] at check-in to the Emergency Department and triage nurse.

## 2017-01-08 NOTE — Progress Notes (Signed)
Patient declined to stay for 77min observation period. Alert and responsive on exit. Denies concerns or complaints.

## 2017-01-08 NOTE — Progress Notes (Signed)
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 She had mastectomy with the 7 axillary node evaluation, 4 cycles of adriamycin/ cytoxan followed by taxotere, then five years of tamoxifen thru June 2005 (no herceptin in 1999). She had bilateral oophorectomy in May 2005. She was briefly on aromatase inhibitor after tamoxifen, but discontinued this herself due to poor tolerance.      04/04/2015 Genetic Testing    Negative genetic testing on the breast/ovarian cancer pnael.  The Breast/Ovarian gene panel offered by [facility] includes sequencing and rearrangement analysis for the following 20 genes:  ATM, BARD1, BRCA1, BRCA2, BRIP1, CDH1, CHEK2, EPCAM, FANCC, MLH1, MSH2, MSH6, NBN, PALB2, PMS2, PTEN, RAD51C, RAD51D, TP53, and XRCC2.      06/26/2015 Pathology Results    Initial Path Report Bone, curettage, Left femur met, intramedullary subtroch tissue - METASTATIC ADENOCARCINOMA.  Estrogen Receptor: 95%, POSITIVE, STRONG STAINING INTENSITY Progesterone Receptor: 2%, POSITIVE, STRONG  HER2 (+), IHC 3+      06/26/2015 Surgery    Biopsy of metastatic tissue and stabilization with Affixus trochanteric nail, proximal and distal interlock of left femur by Dr. [name]       06/28/2015 Imaging    CT Chest w/ Contrast 1. Extensive right internal mammary chain lymphadenopathy consistent with metastatic breast cancer. 2. Lytic metastases involving the posterior elements  at T1 with probable nondisplaced pathologic fracture of the spinous process. 3. No other evidence of thoracic metastatic disease. 4. Trace bilateral pleural effusions with associated bibasilar atelectasis. 5. Indeterminate right thyroid nodule, not previously imaged. This could be evaluated with thyroid ultrasound as clinically warranted.      07/01/2015 Imaging    Bone Scan 1. Increased activity noted throughout the left femur. Although metastatic disease cannot be excluded, these changes are most likely secondary to prior surgery. 2. No other focal abnormalities identified to suggest metastatic disease.      07/12/2015 - 07/26/2015 Radiation Therapy    Left femur, 30 Gy in 10 fractions by Dr. Sondra Come       07/14/2015 Initial Diagnosis    [name], right ([name])      07/19/2015 Imaging    Initial PET Scan 1. Severe multifocal osseous metastatic disease, much greater than anticipated based on prior imaging. 2. Multifocal nodal metastases involving the right internal mammary, prevascular and subpectoral lymph nodes. No axillary pulmonary involvement identified. 3. No distant extra osseous metastases.      07/30/2015 - 11/29/2015 Chemotherapy    Docetaxel, Herceptin and Perjeta every 3 weeks X6 cycles, pt tolerated moderately well, restaging scan showed excellent response       09/18/2015 Imaging    CT Head w/wo Contrast 1. No acute intracranial abnormality or significant interval change. 2. Stable minimal periventricular white matter hypoattenuation on the right. This may reflect a remote ischemic injury. 3. No evidence for metastatic disease to the brain. 4. No focal soft tissue lesion to explain the patient's tenderness.  10/30/2015 Imaging    CT Abdomen Pelvis w/ Contrast 1. Few mildly prominent fluid-filled loops of small bowel scattered throughout the abdomen, with intraluminal fluid density within the distal colon. Given the provided history,  findings are suggestive of acute enteritis/diarrheal illness. 2. No other acute intra-abdominal or pelvic process identified. 3. Widespread osseous metastatic disease, better evaluated on most recent PET-CT from 07/19/2015. No pathologic fracture or other complication. 4. 11 mm hypodensity within the left kidney. This lesion measures intermediate density, and is indeterminate. While this lesion is similar in size relative to recent studies, this is increased in size relative to prior study from 2012. Further evaluation with dedicated renal mass protocol CT and/or MRI is recommended for complete characterization.      12/25/2015 Imaging    Restaging PET Scan 1. Markedly improved skeletal activity, with only several faint foci of residual accentuated metabolic activity, but resolution of the vast majority of the previously extensive osseous metastatic disease. 2. Resolution of the prior right internal mammary and right prevascular lymph nodes. 3. Residual subcutaneous edema and edema along fascia planes in the left upper thigh. This remains somewhat more than I would expect for placement of an IM nail 6 weeks ago. If there is leg swelling further down, consider Doppler venous ultrasound to rule out left lower extremity DVT. I do not perceive an obvious difference in density between the pelvic and common femoral veins on the noncontrast CT data. 4. Chronic right maxillary and right sphenoid sinusitis.      12/2015 -  Chemotherapy    Maintenance Herceptin and pejeta every 3 weeks      01/16/2016 -  Anti-estrogen oral therapy    Letrozole 2.5 mg daily      03/01/2016 Miscellaneous    Patient presented to ED following a fall; she reports she landed on her back and is now experiencing back pain. The patient was evaluated and discharge home same day with pain control.      05/01/2016 Imaging    CT CAP IMPRESSION: 1. Stable exam.  No new or progressive disease identified. 2. No evidence for  residual or recurrent adenopathy within the chest. 3. Stable bone metastasis.      05/01/2016 Imaging    BONE SCAN IMPRESSION: 1. Small focal uptake involving the anterior rib ends of the left second and right fourth ribs, new since the prior bone scan. The location of this uptake is more suggestive of a traumatic or inflammatory etiology as opposed to metastatic disease. No other evidence of metastatic disease. 2. There are other areas of stable uptake which are most likely degenerative/reactive, including right shoulder and cervical spine uptake and uptake along the right femur adjacent to the ORIF hardware.      05/20/2016 Imaging    NM PET Skull to Thigh IMPRESSION: 1. There multiple metastatic foci in the bony pelvis, including some new abnormal foci compare to the prior PET-CT, compatible with mildly progressive bony metastatic disease. Also, a left T11 vertebral hypermetabolic metastatic lesion is observed, new compared to the prior exam. 2. No active extraosseous malignancy is currently identified. 3. Right anterior fourth rib healing fracture, appears be      06/04/2016 -  Chemotherapy    Second line chemotherapy Kadcyla every 3 weeks started on 06/04/16       10/05/2016 Imaging    CT CAP and Bone Scan:  Bones: left intramedullary rod and left femoral head neck screw in place. In the proximal diaphysis of the left femur  there is cortical thickening and irregularity. No lytic or blastic bone lesions. No fracture or vertebral endplate destruction.   No definite evidence of recurrent disease in the chest, abdomen, or pelvis.      10/28/2016 Imaging    CT A/P IMPRESSION: No acute process demonstrated in the abdomen or pelvis. No evidence of bowel obstruction or inflammation.      11/04/2016 Imaging    DG foot complete left: IMPRESSION: No fracture or dislocation.  No soft tissue abnormality      11/23/2016 Imaging    CT RIGHT FEMUR IMPRESSION: Negative CT scan  of the right thigh.  No visible metastatic disease.  CT LEFT FEMUR IMPRESSION: 1. Postsurgical changes related to prior cephalomedullary rod fixation of the left femur with linear lucency along the anterolateral proximal femoral cortex at level of the lesser trochanter, suspicious for nondisplaced fracture. 2. Slightly permeative appearance of the proximal left femoral cortex at the site of known prior osseous metastases is largely unchanged. 3. Unchanged patchy lucency and sclerosis along the anterior acetabulum, which appears to correspond with an area of faint uptake on the prior PET-CT, suspicious for osseous metastasis. These results will be called to the ordering clinician or representative by the Radiologist Assistant, and communication documented in the PACS or zVision Dashboard.         HISTORY OF PRESENTING ILLNESS (From Dr. Mariana Kaufman note on 02/06/2016):  [name] 51 y.o. 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 She has had an excellent response to 6 cycles of taxotere herceptin perjeta from 07-30-15 thru 11-29-15. She is continuing herceptin and perjeta every 3 weeks. She had extreme local pain with first xgeva injection on 01-16-16 and prefers resuming previous zometa. She began letrozole ~ 01-16-16, has no complaints about this at all.  Last imaging was PET 12-25-15.  NOTE CTs, plain Xrays and bone scan did NOT show any of the extensively metastatic bone involvement 06-2015; MRI and PET did image disease clearly.  Patient continues to do very well overall. MRSA skin lesions on scalp and face improved quickly with doxycycline starting 01-16-16. She continues to use Dial soap and we have again strongly encouraged good hygiene with other family members at home. She has had the 3 lymphedema PT sessions allowed by insurance, continues to do massage for LLE herself, still has  not gotten the ordered compression apparatus for home and will follow up with PT on that. Swelling in LLE has improved with the interventions and does not seem as uncomfortable. She denies any pain. Appetite is excellent, bowels fine, no SOB, good energy, no problems with PAC, no noted changes in left breast. No bleeding. No fever or symptoms of infection. She slipped in tub last week, fell onto right arm fortunately without injury. She was upset by a visitor and shaky then, but no tremors otherwise.   She has two children and 4 grandchildren. She has had genetic testing and it was negative. She lives at home with her husband. She is very active. Her children live close to her.   CURRENT THERAPY:  Letrozole 2.'5mg'$  daily, second line Kadcyla every 3 weeks started on 06/04/16  INTERIM HISTORY:  She returns today for follow up and ongoing treatment. She presents to the clinic today with her interpreter, husband and friend.  She was to have a PET in [place] but had some soda so it was cancelled. She does not want  to return to [place].  She reports to having bleeding with BM that occurred 3 times in 1 day. This occurred 2 weeks ago. There was significant blood with no straining. She is not sure if she has hemorrhoids. This episode has not occurred again. Pain in her leg has increased form weather but no new pain. She has not been taking potassium due to how large the pill is. She has tried the liquid but did not tolerate it. She asked about getting a colonoscopy because she has not had one before.     MEDICAL HISTORY:  Past Medical History:  Diagnosis Date  . Abnormal Pap smear   . Anxiety   . Breast cancer ([place])   . Cancer ([place]) 1999   breast-s/p mastectomy, chemo, rad  . CIN I (cervical intraepithelial neoplasia I) 2003   by colpo  . Congenital deafness   . Deaf   . Depression   . Radiation 07/12/15-07/26/15   left femur 30 Gy  . S/P bilateral oophorectomy     SURGICAL HISTORY: Past  Surgical History:  Procedure Laterality Date  . ABDOMINAL HYSTERECTOMY    . INTRAMEDULLARY (IM) NAIL INTERTROCHANTERIC Left 06/26/2015   Procedure: LEFT BIOMET LONG AFFIXS NAIL;  Surgeon: [name], MD;  Location: [facility];  Service: Orthopedics;  Laterality: Left;  . IR GENERIC HISTORICAL  08/06/2015   IR US GUIDE VASC ACCESS LEFT 08/06/2015 Aletta Edouard, MD [facility]  . IR GENERIC HISTORICAL  08/06/2015   IR FLUORO GUIDE CV LINE LEFT 08/06/2015 Aletta Edouard, MD [facility]  . IR GENERIC HISTORICAL  03/05/2016   IR CV LINE INJECTION 03/05/2016 Markus Daft, MD [facility]  . MASTECTOMY     right breast  . MASTECTOMY    . OVARIAN CYST REMOVAL    . RADIOLOGY WITH ANESTHESIA Left 06/25/2015   Procedure: MRI OF LEFT HIP WITH OR WITHOUT CONTRAST;  Surgeon: Medication Radiologist, MD;  Location: [facility];  Service: Radiology;  Laterality: Left;  DR. MCINTYRE/MRI  . TUBAL LIGATION      SOCIAL HISTORY: Social History   Socioeconomic History  . Marital status: Married    Spouse name: Not on file  . Number of children: 2  . Years of education: Not on file  . Highest education level: Not on file  Social Needs  . Financial resource strain: Not on file  . Food insecurity - worry: Not on file  . Food insecurity - inability: Not on file  . Transportation needs - medical: Not on file  . Transportation needs - non-medical: Not on file  Occupational History  . Not on file  Tobacco Use  . Smoking status: Never Smoker  . Smokeless tobacco: Never Used  Substance and Sexual Activity  . Alcohol use: No  . Drug use: No  . Sexual activity: Yes    Birth control/protection: Surgical  Other Topics Concern  . Not on file  Social History Narrative  . Not on file    FAMILY HISTORY: Family History  Problem Relation Age of Onset  . Hypertension Mother   . Seizures Mother   . Alzheimer's disease Maternal Uncle   . Pancreatitis Maternal Grandmother   . Heart attack Maternal Grandfather      ALLERGIES:  is allergic to trazodone and nefazodone; chlorhexidine; promethazine hcl; and diphenhydramine hcl.  MEDICATIONS:  Current Outpatient Medications  Medication Sig Dispense Refill  . Cholecalciferol 2000 units CAPS Take 1 capsule by mouth daily.    . diazepam (VALIUM)  5 MG tablet TAKE 1 TABLET BY MOUTH AT BEDTIME AS NEEDED FOR ANXIETY 30 tablet 0  . divalproex (DEPAKOTE) 250 MG DR tablet Take 1 tablet (250 mg total) by mouth at bedtime. 30 tablet 2  . HYDROcodone-acetaminophen (NORCO) 5-325 MG tablet Take 1-2 tablets every 6 (six) hours as needed by mouth for moderate pain. 45 tablet 0  . HYDROmorphone (DILAUDID) 2 MG tablet Take 1 tablet (2 mg total) every 4 (four) hours as needed by mouth for severe pain. 60 tablet 0  . letrozole (FEMARA) 2.5 MG tablet Take 1 tablet (2.5 mg total) daily by mouth. 30 tablet 2  . lidocaine-prilocaine (EMLA) cream Apply 1 application as needed topically. Apply to Porta-Cath 1-2 hours prior to access as directed. 30 g 2  . traZODone (DESYREL) 50 MG tablet Take 1 tablet (50 mg total) at bedtime as needed by mouth for sleep. 30 tablet 3  . ondansetron (ZOFRAN) 8 MG tablet Take 1 tablet (8 mg total) by mouth every 8 (eight) hours as needed for nausea or vomiting. (Patient not taking: Reported on 12/16/2016) 30 tablet 0  . potassium chloride SA (K-DUR,KLOR-CON) 20 MEQ tablet Take 2 tablets daily for 1 week then take 1 tablet daily (Patient not taking: Reported on 01/08/2017) 40 tablet 1  . prochlorperazine (COMPAZINE) 10 MG tablet Take 1 tablet (10 mg total) by mouth every 6 (six) hours as needed for nausea or vomiting. (Patient not taking: Reported on 12/16/2016) 30 tablet 2   No current facility-administered medications for this visit.    Facility-Administered Medications Ordered in Other Visits  Medication Dose Route Frequency Provider Last Rate Last Dose  . sodium chloride 0.9 % 1,000 mL with potassium chloride 10 mEq infusion   Intravenous Continuous  Gordy Levan, MD   Stopped at 09/10/15 1653  . sodium chloride 0.9 % injection 10 mL  10 mL Intravenous PRN Livesay, Lennis P, MD        REVIEW OF SYSTEMS:  Constitutional: Denies fevers, chills or abnormal night sweats  Eyes: Denies blurriness of vision, double vision or watery eyes Ears, nose, mouth, throat, and face: Denies mucositis or sore throat (+) deaf Respiratory: Denies cough, dyspnea or wheezes Cardiovascular: Denies palpitation, chest discomfort or lower extremity swelling Gastrointestinal:  Denies nausea, heartburn or change in bowel habits Lymphatics: Denies new lymphadenopathy or easy bruising. MSK: (+) Left leg pain  Behavioral/Psych: (+)mild anxiety All other systems were reviewed with the patient and are negative.  PHYSICAL EXAMINATION: ECOG PERFORMANCE STATUS: 1 BP (!) 114/57 (BP Location: Left Arm, Patient Position: Sitting) Comment: nurse aware  Pulse 63   Temp 97.8 F (36.6 C) (Oral)   Resp 18   Ht '5\' 5"'$  (1.651 m)   Wt 128 lb 12.8 oz (58.4 kg)   SpO2 100%   BMI 21.43 kg/m    GENERAL:alert, no distress and comfortable  EYES: normal, conjunctiva are pink and non-injected, sclera clear OROPHARYNX:no exudate, no erythema and lips, buccal mucosa, and tongue normal  NECK: supple, thyroid normal size, non-tender, without nodularity LYMPH:  no palpable lymphadenopathy in the cervical, axillary or inguinal LUNGS: clear to auscultation and percussion with normal breathing effort HEART: regular rate & rhythm and no murmurs and no lower extremity edema ABDOMEN:abdomen soft, non-tender and normal bowel sounds Musculoskeletal:no cyanosis of digits and no clubbing  PSYCH: alert & oriented x 3 with fluent speech NEURO: no focal motor/sensory deficits Breasts: Breast inspection showed Right breast is surgically absent status post reconstruction. Palpation of the  breasts and axilla revealed no obvious mass that I could appreciate.   LABORATORY DATA:  I have  reviewed the data as listed CBC Latest Ref Rng & Units 01/08/2017 12/16/2016 11/25/2016  WBC 3.9 - 10.3 10e3/uL 4.7 4.3 5.1  Hemoglobin 11.6 - 15.9 g/dL 11.9 12.1 11.8  Hematocrit 34.8 - 46.6 % 34.8 35.8 35.4  Platelets 145 - 400 10e3/uL 155 142(L) 167   CMP Latest Ref Rng & Units 01/08/2017 12/16/2016 11/25/2016  Glucose 70 - 140 mg/dl 78 108 110  BUN 7.0 - 26.0 mg/dL 10.1 8.1 7.6  Creatinine 0.6 - 1.1 mg/dL 0.9 0.9 0.8  Sodium 136 - 145 mEq/L 141 142 142  Potassium 3.5 - 5.1 mEq/L 3.3(L) 3.2(L) 2.9(LL)  Chloride 101 - 111 mmol/L - - -  CO2 22 - 29 mEq/L '26 24 26  '$ Calcium 8.4 - 10.4 mg/dL 9.2 9.5 9.5  Total Protein 6.4 - 8.3 g/dL 7.1 7.4 7.8  Total Bilirubin 0.20 - 1.20 mg/dL 0.95 1.30(H) 0.95  Alkaline Phos 40 - 150 U/L 64 63 69  AST 5 - 34 U/L '21 25 24  '$ ALT 0 - 55 U/L '17 24 29   '$ PROCEDURES:  ECHO 12/17/16  Study Conclusions - Left ventricle: Inferobasal hypokinesis. The cavity size was   normal. Systolic function was normal. The estimated ejection   fraction was in the range of 50% to 55%. Wall motion was normal;   there were no regional wall motion abnormalities. Left   ventricular diastolic function parameters were normal. - Atrial septum: No defect or patent foramen ovale was identified. - Impressions: Normal GLS -17.7. Impressions: - Normal GLS -17.7.   Echocardiogram 05/21/2016 Study Conclusions  - Left ventricle: GLS is normal at -20.6% The cavity size was   normal. There was mild focal basal hypertrophy of the septum.   Systolic function was normal. The estimated ejection fraction was   55%. There is akinesis of the basalinferior myocardium. - Aortic valve: Trileaflet; normal thickness, mildly calcified   leaflets. There was trivial regurgitation. - Mitral valve: There was trivial regurgitation. - Tricuspid valve: There was mild regurgitation. - Pulmonic valve: There was trivial regurgitation. - Pulmonary arteries: PA peak pressure: 37 mm Hg  (S). Impressions: - The right ventricular systolic pressure was increased consistent   with mild pulmonary hypertension.   RADIOGRAPHIC STUDIES:   CT A/P IMPRESSION: 10/28/16 No acute process demonstrated in the abdomen or pelvis. No evidence of bowel obstruction or inflammation.  CT CAP and Bone Scan: 10/05/16  Bones: left intramedullary rod and left femoral head neck screw in place. In the proximal diaphysis of the left femur there is cortical thickening and irregularity. No lytic or blastic bone lesions. No fracture or vertebral endplate destruction.  No definite evidence of recurrent disease in the chest, abdomen, or pelvis.   PET 05/20/2016 IMPRESSION: 1. There multiple metastatic foci in the bony pelvis, including some new abnormal foci compare to the prior PET-CT, compatible with mildly progressive bony metastatic disease. Also, a left T11 vertebral hypermetabolic metastatic lesion is observed, new compared to the prior exam. 2. No active extraosseous malignancy is currently identified. 3. Right anterior fourth rib healing fracture, appears benign.  NM Bone Scan 05/01/2016 IMPRESSION: 1. Small focal uptake involving the anterior rib ends of the left second and right fourth ribs, new since the prior bone scan. The location of this uptake is more suggestive of a traumatic or inflammatory etiology as opposed to metastatic disease. No other evidence of metastatic disease.  2. There are other areas of stable uptake which are most likely degenerative/reactive, including right shoulder and cervical spine uptake and uptake along the right femur adjacent to the ORIF Hardware.  CT CAP 05/01/2016 IMPRESSION: 1. Stable exam.  No new or progressive disease identified. 2. No evidence for residual or recurrent adenopathy within the chest. 3. Stable bone metastasis.   I have personally reviewed the radiological images as listed and agreed with the findings in the report. No  results found.   ASSESSMENT & PLAN:  [name] is a 52 y.o. postmenopause female with:  1. 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 -Restaging PET 12-25-15 markedly improved of bone metastasis after initial systemic chemo  -she was switched to letrozole and maintenance Herceptin and the perjeta on 01-16-16.  -Did not tolerate SQ xgeva and it was changed to zometa  -Echocardiogram 01-27-16 showed normal EF, will continue monitoring every 3 months when she is on herceptin -I reviewed her CT scan and bone scan from 05/01/2016, which showed stable disease overall, a few new uptake of anterior ribs on bone scan are indeterminate. -She has developed worsening left hip pain, and more fatigued lately. I have clinical concern of disease progression. -I previously  discussed her restaging PET scan from 05/20/2016. Unfortunately it showed mild disease progression of her bone metastasis  -I have changed her treatment to second line Kadcyla, she is tolerating very well. She declined other chemo.  -continue letrozole -Labs reviewed, her bilirubin is slightly elevated but stable, adequate to continue treatment. 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  -She moved to [facility] for 3 months  -We received records from [facility] in [place], [place]. She underwent CT CAP w contrast on 10/02/16 and bone scan on 09/21/16. CT shows no evidence of recurrent disease in the chest, abdomen, or pelvis; Bone scan indicates no lytic or blastic bone lesions.  -She is tolerating Kadcyla well overall.  -Her PET for yesterday was cancelled to her ingestion of soda. Will reschedule for 3 weeks here before her next cycle. I explained that ingesting sugar would interfere with the quality of her scan.  -Labs reviewed, CBC WNL, potassium at 3.3 and stable.  -She notes to  having 1 episode of blood in her stool, will monitor closely. If she has another episode of bleeding I would recommend getting a colonoscopy sooner than later.  -F/u in 3 weeks with PET scan  -She has been on Zometa every 4-6 weeks since June 2017, will change to every 3 months   2. History of MRSA skin infection scalp, boil at low back scalp and upper lip -I prescribed doxycycline on 04/24/16 for 7 days  - Resolved now.  3.congenital deafness -needs sign language interpreter for all visits.  -Husband also deaf.  4. premature surgical menopause -post BSO 206fr pelvic pain and ovarian cysts, likely was also beneficial from standpoint of the breast cancer. -hysterectomy.   5. Intermittent anemia  -mild, possible related to prior chemo -She is asymptomatic, we'll continue close monitoring  6. Hypokalemia  -K 3.1 on 03/05/16, she is on KCL 258m daily, increased to 405mdaily for a week. -K 3.2 on 04/03/16. The patient is on KCl 84m86maily, increased to 40me79mily for a week and then take 1 pill a day as normal. -K 3.3 on 07/16/16, continue KCL 84meq87mly  -  received IV fluids in 11/25/16  -She does not tolerate liquid potassium so she will continue with tablet form. She will conitnues 20 mEq daily  -She has not been taking potassium due to the size. Her Potassium is 3.3 today (01/08/17). I recommend she can crush the potassium pill and mix it in a smoothie or dink.   7. Goal of care discussion  -We previously discussed the incurable nature of her cancer, and the overall poor prognosis, especially if she does not have good response to chemotherapy or progress on chemo.  -The patient understands the goal of care is palliative. -she is full code for now   8. Left Leg Pain -she initially presented with worsening left leg and hip pain secondary to bone mets, she had surgery before, this was much improved after her prior chemo - The patient previously took Hydrocodone for this pain,  which helped. - I refilled Hydrocodone previously on 5/3. She will take 1 tablet every 6 hours as needed for pain. -Pain has recurred. Recent PET scan showed minimum uptake, unlikely related to bone mets progression  - I encouraged her to call Dr. [name] about the pain in her left leg. She has followed-up  With him.  -She requests to have bed for all infusions   9. Insomnia -I give her Ambien '5mg'$ , but it did not work well.  -continue ativan 1 mg to take at night as needed  -mirtazapine did not work, she also tried trazodone from PCP which gave her a rash. She stopped taking it.   10. Rectal bleeding  -She had a 1 day of rectal bleeding in December and 18, no recurrent rectal bleeding so far. -We will monitor clinically.  If she has recurrent rectal bleeding, I will schedule her for colonoscopy.   Plan:   -Please cancel PET on 12/31 and schedule for 2-3 weeks at [facility] -Lab, flush, f/u and Kadcyla in 3 weeks  -She agrees to restart oral potassium supplement once daily -We will change Zometa to every 3 months  Pt had many questions. All questions were answered. The patient knows to call the clinic with any problems, questions or concerns.  I spent 25 minutes counseling the patient face to face. The total time spent in the appointment was 30 minutes and more than 50% was on counseling.  This document serves as a record of services personally performed by Truitt Merle, MD. It was created on her behalf by Joslyn Devon, a trained medical scribe. The creation of this record is based on the scribe's personal observations and the provider's statements to them.    I have reviewed the above documentation for accuracy and completeness, and I agree with the above.     Truitt Merle, MD 01/08/2017

## 2017-01-09 ENCOUNTER — Encounter: Payer: Self-pay | Admitting: Hematology

## 2017-01-11 ENCOUNTER — Ambulatory Visit: Payer: Medicaid Other

## 2017-01-13 ENCOUNTER — Other Ambulatory Visit: Payer: Self-pay | Admitting: Internal Medicine

## 2017-01-13 DIAGNOSIS — C50911 Malignant neoplasm of unspecified site of right female breast: Secondary | ICD-10-CM

## 2017-01-13 DIAGNOSIS — C7951 Secondary malignant neoplasm of bone: Principal | ICD-10-CM

## 2017-01-13 NOTE — Telephone Encounter (Signed)
Needs refill on codone.  Please call her when it is ready for pcikp

## 2017-01-15 MED ORDER — HYDROCODONE-ACETAMINOPHEN 5-325 MG PO TABS: 1.0000 | ORAL_TABLET | Freq: Four times a day (QID) | ORAL | 0 refills | Status: DC | PRN

## 2017-01-15 NOTE — Addendum Note (Signed)
Addended by: Valerie Roys on: 01/15/2017 04:27 PM   Modules accepted: Orders

## 2017-01-15 NOTE — Telephone Encounter (Signed)
LM for patient.  Gabrianna Fassnacht,CMA  

## 2017-01-15 NOTE — Telephone Encounter (Signed)
Prescription placed up front for patient to pick up.

## 2017-01-15 NOTE — Telephone Encounter (Signed)
Patient came by office and states that she needed her hydromorphone not her hydrocodone.  Patient left script here and was informed I would call her on Monday with a status update.  Jasiel Belisle,CMA

## 2017-01-18 ENCOUNTER — Other Ambulatory Visit: Payer: Self-pay | Admitting: Internal Medicine

## 2017-01-18 DIAGNOSIS — G4700 Insomnia, unspecified: Secondary | ICD-10-CM

## 2017-01-18 MED ORDER — HYDROMORPHONE HCL 2 MG PO TABS: 2.0000 mg | ORAL_TABLET | ORAL | 0 refills | Status: DC | PRN

## 2017-01-18 NOTE — Telephone Encounter (Signed)
Pt needs refill on diazepam, she said the other one expired

## 2017-01-18 NOTE — Telephone Encounter (Signed)
LM for patient informing her that script is ready for pick up. Yiselle Babich,CMA

## 2017-01-19 NOTE — Telephone Encounter (Signed)
Pt called again about her refill on diazepam. She cannot sleep and is getting really irritated because the Rx hasnt been filled

## 2017-01-20 MED ORDER — DIAZEPAM 5 MG PO TABS: ORAL_TABLET | ORAL | 0 refills | Status: DC

## 2017-01-20 NOTE — Telephone Encounter (Signed)
Prescription placed up front for patient to pick up.

## 2017-01-20 NOTE — Telephone Encounter (Signed)
Patient informed.  Corayma Cashatt,CMA  

## 2017-01-23 IMAGING — CT NM PET TUM IMG INITIAL (PI) SKULL BASE T - THIGH
1 of 9 series · 1 of 25 positions shown · non-contrast
Comparison: Whole-body bone scan 07/01/2015, chest CT 06/28/2015
and abdominal CT 06/10/2015

CLINICAL DATA: Initial treatment strategy for right breast cancer
with osseous metastatic disease.

EXAM:
NUCLEAR MEDICINE PET SKULL BASE TO THIGH
TECHNIQUE: 6.54 mCi F-18 FDG was injected intravenously. Full-ring PET imaging
was performed from the skull base to thigh after the radiotracer. CT
data was obtained and used for attenuation correction and anatomic
localization.
FASTING BLOOD GLUCOSE:  Value: 101 mg/dl

[Series 3: pet sk_thigh ac · axial · 5.0mm · 4.07mm/px · 1 of 207 slices shown]
[im 138/207]
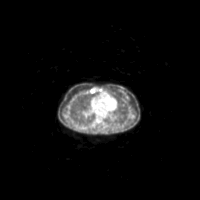

[1 of 25 positions shown; findings below may reference images not displayed]

FINDINGS: NECK

No hypermetabolic cervical lymph nodes are identified.There is mild
asymmetric activity associated with the left palatine tonsil (SUV
max 6.3). No focal lesion identified on CT imaging. No other
abnormalities of the pharyngeal mucosal space seen. There is a right
thyroid nodule measuring 12 mm on image 33, without associated
abnormal metabolic activity.

CHEST

There is focal hypermetabolic activity in the right superior chest
wall which appears to correspond with a small subpectoral node on
image 44. This has an SUV max of 10.2. There is extensive
hypermetabolic right internal mammary nodal activity. This has
mildly progressed compared with the prior chest CT, measuring up to
2.8 x 1.3 cm on image 76. This has an SUV max of 13.9. There are
small hypermetabolic prevascular nodes (images 52 and 58). There is
no hypermetabolic axillary adenopathy status post right chest wall
reconstruction. There are no suspicious pulmonary findings.

ABDOMEN/PELVIS

There is no hypermetabolic activity within the liver, adrenal
glands, spleen or pancreas. There is no hypermetabolic nodal
activity.

SKELETON

There is extensive hypermetabolic osseous metastatic disease
throughout the axial and proximal appendicular skeleton. This is
much more extensive than and anticipated from the patient's prior
studies. This extensive disease throughout the spine with prominent
involvement at C7 and T1. As on prior chest CT, there is a
pathologic fracture involving the T1 spinous process. Tumor at this
level has an SUV max of 19.3. There is multifocal disease throughout
the pelvis with components in the right sacrum having an SUV max of
25.5 mm the left iliac bone an SUV max of 11.1. There is extensive
proximal left femoral activity status post ORIF.
IMPRESSION: 1. Severe multifocal osseous metastatic disease, much greater than
anticipated based on prior imaging.
2. Multifocal nodal metastases involving the right internal mammary,
prevascular and subpectoral lymph nodes. No axillary pulmonary
involvement identified.
3. No distant extra osseous metastases.

## 2017-01-26 ENCOUNTER — Encounter (INDEPENDENT_AMBULATORY_CARE_PROVIDER_SITE_OTHER): Payer: Self-pay | Admitting: Orthopaedic Surgery

## 2017-01-26 ENCOUNTER — Ambulatory Visit (INDEPENDENT_AMBULATORY_CARE_PROVIDER_SITE_OTHER): Payer: Medicaid Other | Admitting: Orthopaedic Surgery

## 2017-01-26 VITALS — BP 114/73 | HR 67 | Ht 67.0 in | Wt 128.0 lb

## 2017-01-26 DIAGNOSIS — C50911 Malignant neoplasm of unspecified site of right female breast: Secondary | ICD-10-CM | POA: Diagnosis not present

## 2017-01-26 DIAGNOSIS — C7951 Secondary malignant neoplasm of bone: Secondary | ICD-10-CM | POA: Diagnosis not present

## 2017-01-26 NOTE — Progress Notes (Signed)
Office Visit Note   Patient: [name]           Date of Birth: 1965-03-29           MRN: [id number] Visit Date: 01/26/2017              Requested by: [name], MD [address] [place], [place] [address] PCP: [name], MD   Assessment & Plan: Visit Diagnoses:  1. [name], right [facility])     Plan: Patient is ambulatory without any walking aids and has good relief of her previous hip pain.  I can check her back again on an as-needed basis.  Follow-Up Instructions: Return if symptoms worsen or fail to improve.   Orders:  No orders of the defined types were placed in this encounter.  No orders of the defined types were placed in this encounter.     Procedures: No procedures performed   Clinical Data: No additional findings.   Subjective: Chief Complaint  Patient presents with  . Left Leg - Follow-up    HPI 52 year old female who is deaf and has metastatic breast cancer multiple sites including left proximal femur where she underwent a trochanteric nail fixation for pathologic lesion 06/26/2015.  She has now 18 months out from the surgery.  She is amatory without a walker pain is significantly improved her hair is grown out and she has minimal discomfort in the hip region.  She states that when it rains or gets cold she sometimes notices mild aching in the hip.  She no longer uses her walker or cane.  Review of Systems 14 point review of systems updated and is unchanged from last office visit other than as mentioned in HPI.   Objective: Vital Signs: BP 114/73   Pulse 67   Ht 5\' 7"  (1.702 m)   Wt 128 lb (58.1 kg)   BMI 20.05 kg/m   Physical Exam  Constitutional: She is oriented to person, place, and time. She appears well-developed.  HENT:  Head: Normocephalic.  Right Ear: External ear normal.  Left Ear: External ear normal.  Eyes: Pupils are equal, round, and reactive to light.  Neck: No tracheal deviation  present. No thyromegaly present.  Cardiovascular: Normal rate.  Pulmonary/Chest: Effort normal.  Abdominal: Soft.  Neurological: She is alert and oriented to person, place, and time.  Skin: Skin is warm and dry.  Psychiatric: She has a normal mood and affect. Her behavior is normal.    Ortho Exam minimal discomfort with internal/external rotation of her hip.  Knees reach full extension good quad strength she is amatory without limp.  Specialty Comments:  No specialty comments available.  Imaging: No results found.   PMFS History: Patient Active Problem List   Diagnosis Date Noted  . Bilateral thigh pain 11/19/2016  . Goals of care, counseling/discussion 03/05/2016  . MRSA (methicillin resistant Staphylococcus aureus) infection 01/18/2016  . Anemia due to antineoplastic chemotherapy 12/11/2015  . Lymphedema of left leg 12/11/2015  . Osteopenia of multiple sites 11/26/2015  . Chemotherapy-induced peripheral neuropathy ([place]) 11/03/2015  . Port catheter in place 09/26/2015  . Chemotherapy induced nausea and vomiting 08/13/2015  . Cancer related pain 08/13/2015  . [name], right ([place]) 07/14/2015  . S/P TRAM (transverse rectus abdominis muscle) flap breast reconstruction 07/14/2015  . Thyroid nodule 07/09/2015  . Depression   . Generalized anxiety disorder   . Esophageal reflux   . Genetic  testing 04/04/2015  . Congenital deafness 02/26/2015  . Premature surgical menopause 02/26/2015  . Benign paroxysmal positional vertigo 07/24/2014  . Migraine 07/24/2014  . DIZZINESS, CHRONIC 01/25/2007  . Insomnia 01/25/2007   Past Medical History:  Diagnosis Date  . Abnormal Pap smear   . Anxiety   . Breast cancer ([place])   . Cancer ([place]) 1999   breast-s/p mastectomy, chemo, rad  . CIN I (cervical intraepithelial neoplasia I) 2003   by colpo  . Congenital deafness   . Deaf   . Depression   . Radiation 07/12/15-07/26/15   left femur 30 Gy  . S/P  bilateral oophorectomy     Family History  Problem Relation Age of Onset  . Hypertension Mother   . Seizures Mother   . Alzheimer's disease Maternal Uncle   . Pancreatitis Maternal Grandmother   . Heart attack Maternal Grandfather     Past Surgical History:  Procedure Laterality Date  . ABDOMINAL HYSTERECTOMY    . INTRAMEDULLARY (IM) NAIL INTERTROCHANTERIC Left 06/26/2015   Procedure: LEFT BIOMET LONG AFFIXS NAIL;  Surgeon: [name], MD;  Location: [facility];  Service: Orthopedics;  Laterality: Left;  . IR GENERIC HISTORICAL  08/06/2015   IR US GUIDE VASC ACCESS LEFT 08/06/2015 Aletta Edouard, MD [facility]  . IR GENERIC HISTORICAL  08/06/2015   IR FLUORO GUIDE CV LINE LEFT 08/06/2015 Aletta Edouard, MD [facility]  . IR GENERIC HISTORICAL  03/05/2016   IR CV LINE INJECTION 03/05/2016 Markus Daft, MD [facility]  . MASTECTOMY     right breast  . MASTECTOMY    . OVARIAN CYST REMOVAL    . RADIOLOGY WITH ANESTHESIA Left 06/25/2015   Procedure: MRI OF LEFT HIP WITH OR WITHOUT CONTRAST;  Surgeon: Medication Radiologist, MD;  Location: [facility];  Service: Radiology;  Laterality: Left;  DR. MCINTYRE/MRI  . TUBAL LIGATION     Social History   Occupational History  . Not on file  Tobacco Use  . Smoking status: Never Smoker  . Smokeless tobacco: Never Used  Substance and Sexual Activity  . Alcohol use: No  . Drug use: No  . Sexual activity: Yes    Birth control/protection: Surgical

## 2017-01-27 ENCOUNTER — Ambulatory Visit (HOSPITAL_COMMUNITY)
Admission: RE | Admit: 2017-01-27 | Discharge: 2017-01-27 | Disposition: A | Discharge status: Home / Self Care | Payer: Medicaid Other | Source: Ambulatory Visit | Attending: Nurse Practitioner | Admitting: Nurse Practitioner

## 2017-01-27 ENCOUNTER — Encounter (HOSPITAL_COMMUNITY): Payer: Self-pay

## 2017-01-27 ENCOUNTER — Other Ambulatory Visit: Payer: Self-pay | Admitting: Nurse Practitioner

## 2017-01-27 DIAGNOSIS — C50911 Malignant neoplasm of unspecified site of right female breast: Secondary | ICD-10-CM

## 2017-01-27 DIAGNOSIS — C7951 Secondary malignant neoplasm of bone: Principal | ICD-10-CM

## 2017-01-27 LAB — GLUCOSE, CAPILLARY: Glucose-Capillary: 80 mg/dL (ref 65–99)

## 2017-01-27 MED ORDER — FLUDEOXYGLUCOSE F - 18 (FDG) INJECTION
6.3000 | Freq: Once | INTRAVENOUS | Status: AC | PRN
  Administered 2017-01-27: 6.3 via INTRAVENOUS

## 2017-01-27 NOTE — Progress Notes (Signed)
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 She had mastectomy with the 7 axillary node evaluation, 4 cycles of adriamycin/ cytoxan followed by taxotere, then five years of tamoxifen thru June 2005 (no herceptin in 1999). She had bilateral oophorectomy in May 2005. She was briefly on aromatase inhibitor after tamoxifen, but discontinued this herself due to poor tolerance.      04/04/2015 Genetic Testing    Negative genetic testing on the breast/ovarian cancer pnael.  The Breast/Ovarian gene panel offered by [facility] includes sequencing and rearrangement analysis for the following 20 genes:  ATM, BARD1, BRCA1, BRCA2, BRIP1, CDH1, CHEK2, EPCAM, FANCC, MLH1, MSH2, MSH6, NBN, PALB2, PMS2, PTEN, RAD51C, RAD51D, TP53, and XRCC2.      06/26/2015 Pathology Results    Initial Path Report Bone, curettage, Left femur met, intramedullary subtroch tissue - METASTATIC ADENOCARCINOMA.  Estrogen Receptor: 95%, POSITIVE, STRONG STAINING INTENSITY Progesterone Receptor: 2%, POSITIVE, STRONG  HER2 (+), IHC 3+      06/26/2015 Surgery    Biopsy of metastatic tissue and stabilization with Affixus trochanteric nail, proximal and distal interlock of left femur by Dr. [name]       06/28/2015 Imaging    CT Chest w/ Contrast 1. Extensive right internal mammary chain lymphadenopathy consistent with metastatic breast cancer. 2. Lytic metastases involving the posterior  elements at T1 with probable nondisplaced pathologic fracture of the spinous process. 3. No other evidence of thoracic metastatic disease. 4. Trace bilateral pleural effusions with associated bibasilar atelectasis. 5. Indeterminate right thyroid nodule, not previously imaged. This could be evaluated with thyroid ultrasound as clinically warranted.      07/01/2015 Imaging    Bone Scan 1. Increased activity noted throughout the left femur. Although metastatic disease cannot be excluded, these changes are most likely secondary to prior surgery. 2. No other focal abnormalities identified to suggest metastatic disease.      07/12/2015 - 07/26/2015 Radiation Therapy    Left femur, 30 Gy in 10 fractions by Dr. Sondra Come       07/14/2015 Initial Diagnosis    [name], right ([place])      07/19/2015 Imaging    Initial PET Scan 1. Severe multifocal osseous metastatic disease, much greater than anticipated based on prior imaging. 2. Multifocal nodal metastases involving the right internal mammary, prevascular and subpectoral lymph nodes. No axillary pulmonary involvement identified. 3. No distant extra osseous metastases.      07/30/2015 - 11/29/2015 Chemotherapy    Docetaxel, Herceptin and Perjeta every 3 weeks X6 cycles, pt tolerated moderately well, restaging scan showed excellent response       09/18/2015 Imaging    CT Head w/wo Contrast 1. No acute intracranial abnormality or significant interval change. 2. Stable minimal periventricular white matter hypoattenuation on the right. This may reflect a remote ischemic injury. 3. No evidence for metastatic disease to the brain. 4. No focal soft tissue lesion to explain the patient's tenderness.  10/30/2015 Imaging    CT Abdomen Pelvis w/ Contrast 1. Few mildly prominent fluid-filled loops of small bowel scattered throughout the abdomen, with intraluminal fluid density within the distal colon. Given the provided  history, findings are suggestive of acute enteritis/diarrheal illness. 2. No other acute intra-abdominal or pelvic process identified. 3. Widespread osseous metastatic disease, better evaluated on most recent PET-CT from 07/19/2015. No pathologic fracture or other complication. 4. 11 mm hypodensity within the left kidney. This lesion measures intermediate density, and is indeterminate. While this lesion is similar in size relative to recent studies, this is increased in size relative to prior study from 2012. Further evaluation with dedicated renal mass protocol CT and/or MRI is recommended for complete characterization.      12/25/2015 Imaging    Restaging PET Scan 1. Markedly improved skeletal activity, with only several faint foci of residual accentuated metabolic activity, but resolution of the vast majority of the previously extensive osseous metastatic disease. 2. Resolution of the prior right internal mammary and right prevascular lymph nodes. 3. Residual subcutaneous edema and edema along fascia planes in the left upper thigh. This remains somewhat more than I would expect for placement of an IM nail 6 weeks ago. If there is leg swelling further down, consider Doppler venous ultrasound to rule out left lower extremity DVT. I do not perceive an obvious difference in density between the pelvic and common femoral veins on the noncontrast CT data. 4. Chronic right maxillary and right sphenoid sinusitis.      12/2015 -  Chemotherapy    Maintenance Herceptin and pejeta every 3 weeks      01/16/2016 -  Anti-estrogen oral therapy    Letrozole 2.5 mg daily      03/01/2016 Miscellaneous    Patient presented to ED following a fall; she reports she landed on her back and is now experiencing back pain. The patient was evaluated and discharge home same day with pain control.      05/01/2016 Imaging    CT CAP IMPRESSION: 1. Stable exam.  No new or progressive disease identified. 2. No evidence  for residual or recurrent adenopathy within the chest. 3. Stable bone metastasis.      05/01/2016 Imaging    BONE SCAN IMPRESSION: 1. Small focal uptake involving the anterior rib ends of the left second and right fourth ribs, new since the prior bone scan. The location of this uptake is more suggestive of a traumatic or inflammatory etiology as opposed to metastatic disease. No other evidence of metastatic disease. 2. There are other areas of stable uptake which are most likely degenerative/reactive, including right shoulder and cervical spine uptake and uptake along the right femur adjacent to the ORIF hardware.      05/20/2016 Imaging    NM PET Skull to Thigh IMPRESSION: 1. There multiple metastatic foci in the bony pelvis, including some new abnormal foci compare to the prior PET-CT, compatible with mildly progressive bony metastatic disease. Also, a left T11 vertebral hypermetabolic metastatic lesion is observed, new compared to the prior exam. 2. No active extraosseous malignancy is currently identified. 3. Right anterior fourth rib healing fracture, appears be      06/04/2016 -  Chemotherapy    Second line chemotherapy Kadcyla every 3 weeks started on 06/04/16       10/05/2016 Imaging    CT CAP and Bone Scan:  Bones: left intramedullary rod and left femoral head neck screw in place. In the proximal diaphysis of the left femur  there is cortical thickening and irregularity. No lytic or blastic bone lesions. No fracture or vertebral endplate destruction.   No definite evidence of recurrent disease in the chest, abdomen, or pelvis.      10/28/2016 Imaging    CT A/P IMPRESSION: No acute process demonstrated in the abdomen or pelvis. No evidence of bowel obstruction or inflammation.      11/04/2016 Imaging    DG foot complete left: IMPRESSION: No fracture or dislocation.  No soft tissue abnormality      11/23/2016 Imaging    CT RIGHT FEMUR IMPRESSION: Negative CT  scan of the right thigh.  No visible metastatic disease.  CT LEFT FEMUR IMPRESSION: 1. Postsurgical changes related to prior cephalomedullary rod fixation of the left femur with linear lucency along the anterolateral proximal femoral cortex at level of the lesser trochanter, suspicious for nondisplaced fracture. 2. Slightly permeative appearance of the proximal left femoral cortex at the site of known prior osseous metastases is largely unchanged. 3. Unchanged patchy lucency and sclerosis along the anterior acetabulum, which appears to correspond with an area of faint uptake on the prior PET-CT, suspicious for osseous metastasis. These results will be called to the ordering clinician or representative by the Radiologist Assistant, and communication documented in the PACS or zVision Dashboard.        01/27/2017 Imaging    IMPRESSION: 1. Mixed response to osseous metastasis. Some hypermetabolic lesions are new and increasingly hypermetabolic, while another index lesion is decreasingly hypermetabolic. 2. No evidence of hypermetabolic soft tissue metastasis.      CURRENT THERAPY:  Letrozole 2.14m daily, second line Kadcyla every 3 weeks started on 06/04/16  INTERVAL HISTORY: Ms. EElie Goodyreturns for follow-up as scheduled to review PET scan the next cycle.  Last Kadcyla received 01/08/2017.  He has low appetite and altered taste but continues to eat normally.  Her left hip pain fluctuates with the weather, denies any new pain.  She has not been taking her potassium because she cannot swallow the large pill.  She has no other specific complaints today.  REVIEW OF SYSTEMS:   Constitutional: Denies fatigue, fevers, chills or abnormal weight loss Eyes: Denies blurriness of vision Ears, nose, mouth, throat, and face: Denies mucositis or sore throat Respiratory: Denies cough, dyspnea or wheezes Cardiovascular: Denies palpitation, chest discomfort or lower extremity  swelling Gastrointestinal:  Denies nausea, vomiting, constipation, diarrhea, heartburn or change in bowel habits.  No bleeding Skin: Denies abnormal skin rashes Lymphatics: Denies new lymphadenopathy or easy bruising Neurological:Denies numbness, tingling or new weaknesses Behavioral/Psych: Mood is stable, no new changes  MSK: Denies new areas of pain (+) Occasional left hip pain, fluctuates with weather All other systems were reviewed with the patient and are negative.  MEDICAL HISTORY:  Past Medical History:  Diagnosis Date  . Abnormal Pap smear   . Anxiety   . Breast cancer [place]   . [place] [place]   breast-s/p mastectomy, chemo, rad  . CIN I (cervical intraepithelial neoplasia I) 2003   by colpo  . Congenital deafness   . Deaf   . Depression   . Radiation 07/12/15-07/26/15   left femur 30 Gy  . S/P bilateral oophorectomy     SURGICAL HISTORY: Past Surgical History:  Procedure Laterality Date  . ABDOMINAL HYSTERECTOMY    . INTRAMEDULLARY (IM) NAIL INTERTROCHANTERIC Left 06/26/2015   Procedure: LEFT BIOMET LONG AFFIXS NAIL;  Surgeon: MMarybelle Killings MD;  [facility]  Service: Orthopedics;  Laterality: Left;  .  IR GENERIC HISTORICAL  08/06/2015   IR US GUIDE VASC ACCESS LEFT 08/06/2015 Aletta Edouard, MD [facility]  . IR GENERIC HISTORICAL  08/06/2015   IR FLUORO GUIDE CV LINE LEFT 08/06/2015 Aletta Edouard, MD [facility]  . IR GENERIC HISTORICAL  03/05/2016   IR CV LINE INJECTION 03/05/2016 Markus Daft, MD [facility]  . MASTECTOMY     right breast  . MASTECTOMY    . OVARIAN CYST REMOVAL    . RADIOLOGY WITH ANESTHESIA Left 06/25/2015   Procedure: MRI OF LEFT HIP WITH OR WITHOUT CONTRAST;  Surgeon: Medication Radiologist, MD;  Location: [facility];  Service: Radiology;  Laterality: Left;  DR. MCINTYRE/MRI  . TUBAL LIGATION      I have reviewed the social history and family history with the patient and they are unchanged from previous note.  ALLERGIES:  is allergic to  trazodone and nefazodone; chlorhexidine; promethazine hcl; and diphenhydramine hcl.  MEDICATIONS:  Current Outpatient Medications  Medication Sig Dispense Refill  . Cholecalciferol 2000 units CAPS Take 1 capsule by mouth daily.    . diazepam (VALIUM) 5 MG tablet TAKE 1 TABLET BY MOUTH AT BEDTIME AS NEEDED FOR ANXIETY 30 tablet 0  . divalproex (DEPAKOTE) 250 MG DR tablet Take 1 tablet (250 mg total) by mouth at bedtime. 30 tablet 2  . HYDROcodone-acetaminophen (NORCO) 5-325 MG tablet Take 1-2 tablets by mouth every 6 (six) hours as needed for moderate pain. 45 tablet 0  . HYDROmorphone (DILAUDID) 2 MG tablet Take 1 tablet (2 mg total) by mouth every 4 (four) hours as needed for severe pain. 30 tablet 0  . letrozole (FEMARA) 2.5 MG tablet Take 1 tablet (2.5 mg total) daily by mouth. 30 tablet 2  . lidocaine-prilocaine (EMLA) cream Apply 1 application as needed topically. Apply to Porta-Cath 1-2 hours prior to access as directed. 30 g 2  . ondansetron (ZOFRAN) 8 MG tablet Take 1 tablet (8 mg total) by mouth every 8 (eight) hours as needed for nausea or vomiting. 30 tablet 0  . traZODone (DESYREL) 50 MG tablet Take 1 tablet (50 mg total) at bedtime as needed by mouth for sleep. 30 tablet 3  . potassium chloride (K-DUR) 10 MEQ tablet Take 2 tablets (20 mEq total) by mouth daily. 60 tablet 1  . prochlorperazine (COMPAZINE) 10 MG tablet Take 1 tablet (10 mg total) by mouth every 6 (six) hours as needed for nausea or vomiting. (Patient not taking: Reported on 01/29/2017) 30 tablet 2   Current Facility-Administered Medications  Medication Dose Route Frequency Provider Last Rate Last Dose  . sodium chloride 0.9 % injection 10 mL  10 mL Intravenous PRN Truitt Merle, MD   10 mL at 01/29/17 1038   Facility-Administered Medications Ordered in Other Visits  Medication Dose Route Frequency Provider Last Rate Last Dose  . sodium chloride 0.9 % 1,000 mL with potassium chloride 10 mEq infusion   Intravenous  Continuous Gordy Levan, MD   Stopped at 09/10/15 1653  . sodium chloride 0.9 % injection 10 mL  10 mL Intravenous PRN Livesay, Lennis P, MD        PHYSICAL EXAMINATION: ECOG PERFORMANCE STATUS: 1 - Symptomatic but completely ambulatory  Vitals:   01/29/17 0952  BP: 120/68  Pulse: (!) 55  Resp: 18  Temp: (!) 97.5 F (36.4 C)  SpO2: 100%   Filed Weights   01/29/17 0952  Weight: 130 lb 6.4 oz (59.1 kg)    GENERAL:alert, no distress and comfortable SKIN:  skin color, texture, turgor are normal, no rashes or significant lesions EYES: normal, Conjunctiva are pink and non-injected, sclera clear OROPHARYNX:no exudate, no erythema and lips, buccal mucosa, and tongue normal  NECK: supple, thyroid normal size, non-tender, without nodularity LYMPH:  no palpable cervical, supraclavicular, axillary lymphadenopathy  LUNGS: clear to auscultation bilaterally with normal breathing effort HEART: regular rate & rhythm and no murmurs and no lower extremity edema ABDOMEN:abdomen soft, non-tender and normal bowel sounds.  No hepatomegaly Musculoskeletal:no cyanosis of digits and no clubbing  NEURO: alert & oriented x 3 with fluent speech, no focal motor/sensory deficits Breasts: (+) Status post right breast mastectomy with reconstruction.  Palpation of the breast and axilla revealed no obvious mass that I could appreciate PAC without erythema  LABORATORY DATA:  I have reviewed the data as listed CBC Latest Ref Rng & Units 01/29/2017 01/08/2017 12/16/2016  WBC 3.9 - 10.3 K/uL 4.4 4.7 4.3  Hemoglobin 11.6 - 15.9 g/dL 11.7 11.9 12.1  Hematocrit 34.8 - 46.6 % 34.5(L) 34.8 35.8  Platelets 145 - 400 K/uL 123(L) 155 142(L)     CMP Latest Ref Rng & Units 01/29/2017 01/08/2017 12/16/2016  Glucose 70 - 140 mg/dL 75 78 108  BUN 7 - 26 mg/dL 5(L) 10.1 8.1  Creatinine 0.60 - 1.10 mg/dL 0.82 0.9 0.9  Sodium 136 - 145 mmol/L 142 141 142  Potassium 3.3 - 4.7 mmol/L 3.2(L) 3.3(L) 3.2(L)  Chloride 98 -  109 mmol/L 104 - -  CO2 22 - 29 mmol/L _0 Calcium 8.4 - 10.4 mg/dL 8.9 9.2 9.5  Total Protein 6.4 - 8.3 g/dL 7.0 7.1 7.4  Total Bilirubin 0.2 - 1.2 mg/dL 1.2 0.95 1.30(H)  Alkaline Phos 40 - 150 U/L 67 64 63  AST 5 - 34 U/L _1 ALT 0 - 55 U/L _2 RADIOGRAPHIC STUDIES: I have personally reviewed the radiological images as listed and agreed with the findings in the report. Nm Pet Image Restag (ps) Skull Base To Thigh  Result Date: 01/28/2017 CLINICAL DATA:  Subsequent treatment strategy for restaging of breast cancer. Right breast primary with bone metastasis. EXAM: NUCLEAR MEDICINE PET SKULL BASE TO THIGH TECHNIQUE: 6.3 mCi F-18 FDG was injected intravenously. Full-ring PET imaging was performed from the skull base to thigh after the radiotracer. CT data was obtained and used for attenuation correction and anatomic localization. FASTING BLOOD GLUCOSE:  Value: 80 mg/dl COMPARISON:  PET of 05/20/2016.  Abdominopelvic CT 10/28/2016. FINDINGS: NECK: No areas of abnormal hypermetabolism.  No cervical adenopathy. CHEST: No pulmonary parenchymal or thoracic nodal hypermetabolism. A left-sided Port-A-Cath terminates at the high right atrium. Right mastectomy and breast reconstruction. Right axillary node dissection. ABDOMEN/PELVIS: No abdominopelvic parenchymal or nodal hypermetabolism. Cholecystectomy. Hysterectomy. SKELETON: Hypermetabolic osseous metastasis again identified. A lesion in the right-sided sacrum measures a S.U.V. max of 7.3 on image 146/series 4 and is new. Right acetabular lesion measures a S.U.V. max of 32.8 on image 169/series 4 versus a S.U.V. max of 14.8 on the prior. Left iliac index lesion measures a S.U.V. max of 3.8 on image 153/series 4 versus a S.U.V. max of 8.7 on the prior. Proximal left femoral fixation. IMPRESSION: 1. Mixed response to osseous metastasis. Some hypermetabolic lesions are new and increasingly hypermetabolic, while another index lesion is  decreasingly hypermetabolic. 2. No evidence of hypermetabolic soft tissue metastasis. Electronically Signed   By: Abigail Miyamoto M.D.   On: 01/28/2017 10:06  ASSESSMENT & PLAN: Mrs. [name] is a 52 y.o. postmenopause female with:  1. Metastatic  right breast cancer to bone, ER+ HER 2+ 2. History of MRSA skin infection scalp, boil at low back, scalp, and upper lip 3. Congenital deafness 4. Premature surgical menopause 5. Intermittent anemia 6. Hypokalemia 7. Goal of care 8. Left leg pain 9. Insomnia 10. Rectal bleeding   Ms. [name] appears stable today.  She completed 8 cycles of q3 week Kadcyla from 06/04/2016 to present.  Moved away 07/2016 - 10/2016 where she continued on therapy. She tolerated very well overall.  Her left hip/leg bone pain is minimal without new pain. Vital signs and weight stable.  Mild thrombocytopenia on CBC, platelet 123K; mild hypokalemia, K3.2.  She does not take oral potassium due to large pill size.  Discontinued 20 mEq oral K and replaced with 10 mEq oral K, take 2 tablets daily.   I reviewed her restaging PET scan with Dr. Burr Medico before discussing with the patient, shows mixed response to osseous metastasis.  Some hypermetabolic lesions are new and increasingly hypermetabolic while left iliac index lesion is decreasingly hypermetabolic. She has new right-sided sacral lesion measuring SUV max of 7.3. Suspect she is developing resistance to Kadcyla. Will hold today and reschedule return visit to discuss next line therapy with Dr. Burr Medico. She had many questions that I answered to the best of my ability.   PLAN -Discontinue 20 mEq oral K; changed to 10 mEq take 2 tabs daily for hypokalemia -Reviewed scans, mixed response to osseous metastases; hold Kadcyla  -Return in 1 week to discuss next line therapy with Dr. Burr Medico   All questions were answered. The patient knows to call the clinic with any problems, questions or concerns. No barriers to learning was detected.     Alla Feeling, NP 01/29/17

## 2017-01-28 ENCOUNTER — Telehealth: Payer: Self-pay | Admitting: Nurse Practitioner

## 2017-01-28 NOTE — Telephone Encounter (Signed)
I attempted to reach patient to review her PET scan, interpreter services was used to leave message on her personal phone to return my call to [phone], I informed her on the message I would also try to call her back tomorrow.

## 2017-01-29 ENCOUNTER — Inpatient Hospital Stay: Payer: Medicaid Other

## 2017-01-29 ENCOUNTER — Inpatient Hospital Stay: Payer: Medicaid Other | Attending: Hematology

## 2017-01-29 ENCOUNTER — Telehealth: Payer: Self-pay | Admitting: Hematology

## 2017-01-29 ENCOUNTER — Inpatient Hospital Stay (HOSPITAL_BASED_OUTPATIENT_CLINIC_OR_DEPARTMENT_OTHER): Payer: Medicaid Other | Admitting: Nurse Practitioner

## 2017-01-29 ENCOUNTER — Encounter: Payer: Self-pay | Admitting: Nurse Practitioner

## 2017-01-29 VITALS — BP 120/68 | HR 55 | Temp 97.5°F | Resp 18 | Ht 67.0 in | Wt 130.4 lb

## 2017-01-29 DIAGNOSIS — Z170 Estrogen receptor positive status [ER+]: Secondary | ICD-10-CM

## 2017-01-29 DIAGNOSIS — Z8614 Personal history of Methicillin resistant Staphylococcus aureus infection: Secondary | ICD-10-CM

## 2017-01-29 DIAGNOSIS — F419 Anxiety disorder, unspecified: Secondary | ICD-10-CM | POA: Diagnosis not present

## 2017-01-29 DIAGNOSIS — H905 Unspecified sensorineural hearing loss: Secondary | ICD-10-CM

## 2017-01-29 DIAGNOSIS — E876 Hypokalemia: Secondary | ICD-10-CM | POA: Insufficient documentation

## 2017-01-29 DIAGNOSIS — F329 Major depressive disorder, single episode, unspecified: Secondary | ICD-10-CM | POA: Insufficient documentation

## 2017-01-29 DIAGNOSIS — C50919 Malignant neoplasm of unspecified site of unspecified female breast: Secondary | ICD-10-CM

## 2017-01-29 DIAGNOSIS — Z923 Personal history of irradiation: Secondary | ICD-10-CM

## 2017-01-29 DIAGNOSIS — D696 Thrombocytopenia, unspecified: Secondary | ICD-10-CM | POA: Insufficient documentation

## 2017-01-29 DIAGNOSIS — Z9011 Acquired absence of right breast and nipple: Secondary | ICD-10-CM | POA: Insufficient documentation

## 2017-01-29 DIAGNOSIS — Z9221 Personal history of antineoplastic chemotherapy: Secondary | ICD-10-CM

## 2017-01-29 DIAGNOSIS — C50911 Malignant neoplasm of unspecified site of right female breast: Secondary | ICD-10-CM | POA: Insufficient documentation

## 2017-01-29 DIAGNOSIS — G4700 Insomnia, unspecified: Secondary | ICD-10-CM | POA: Insufficient documentation

## 2017-01-29 DIAGNOSIS — Z95828 Presence of other vascular implants and grafts: Secondary | ICD-10-CM

## 2017-01-29 DIAGNOSIS — G893 Neoplasm related pain (acute) (chronic): Secondary | ICD-10-CM | POA: Insufficient documentation

## 2017-01-29 DIAGNOSIS — D649 Anemia, unspecified: Secondary | ICD-10-CM | POA: Insufficient documentation

## 2017-01-29 DIAGNOSIS — Z79899 Other long term (current) drug therapy: Secondary | ICD-10-CM | POA: Insufficient documentation

## 2017-01-29 DIAGNOSIS — C7951 Secondary malignant neoplasm of bone: Secondary | ICD-10-CM | POA: Diagnosis not present

## 2017-01-29 DIAGNOSIS — I272 Pulmonary hypertension, unspecified: Secondary | ICD-10-CM | POA: Diagnosis not present

## 2017-01-29 DIAGNOSIS — C799 Secondary malignant neoplasm of unspecified site: Principal | ICD-10-CM

## 2017-01-29 LAB — COMPREHENSIVE METABOLIC PANEL
ALBUMIN: 3.4 g/dL — AB (ref 3.5–5.0)
ALK PHOS: 67 U/L (ref 40–150)
ALT: 25 U/L (ref 0–55)
AST: 28 U/L (ref 5–34)
Anion gap: 10 (ref 3–11)
BILIRUBIN TOTAL: 1.2 mg/dL (ref 0.2–1.2)
BUN: 5 mg/dL — AB (ref 7–26)
CO2: 28 mmol/L (ref 22–29)
CREATININE: 0.82 mg/dL (ref 0.60–1.10)
Calcium: 8.9 mg/dL (ref 8.4–10.4)
Chloride: 104 mmol/L (ref 98–109)
GFR calc Af Amer: >60 mL/min (ref >60)
GFR calc non Af Amer: >60 mL/min (ref >60)
GLUCOSE: 75 mg/dL (ref 70–140)
POTASSIUM: 3.2 mmol/L — AB (ref 3.3–4.7)
Sodium: 142 mmol/L (ref 136–145)
TOTAL PROTEIN: 7 g/dL (ref 6.4–8.3)

## 2017-01-29 LAB — CBC WITH DIFFERENTIAL/PLATELET
BASOS ABS: 0 10*3/uL (ref 0.0–0.1)
Basophils Relative: 1 %
EOS PCT: 3 %
Eosinophils Absolute: 0.1 10*3/uL (ref 0.0–0.5)
HCT: 34.5 % — ABNORMAL LOW (ref 34.8–46.6)
Hemoglobin: 11.7 g/dL (ref 11.6–15.9)
LYMPHS PCT: 40 %
Lymphs Abs: 1.7 10*3/uL (ref 0.9–3.3)
MCH: 29.1 pg (ref 25.1–34.0)
MCHC: 33.8 g/dL (ref 31.5–36.0)
MCV: 86.2 fL (ref 79.5–101.0)
MONO ABS: 0.4 10*3/uL (ref 0.1–0.9)
Monocytes Relative: 8 %
Neutro Abs: 2.1 10*3/uL (ref 1.5–6.5)
Neutrophils Relative %: 48 %
PLATELETS: 123 10*3/uL — AB (ref 145–400)
RBC: 4.01 MIL/uL (ref 3.70–5.45)
RDW: 13.5 % (ref 11.2–16.1)
WBC: 4.4 10*3/uL (ref 3.9–10.3)

## 2017-01-29 MED ORDER — POTASSIUM CHLORIDE ER 10 MEQ PO TBCR: 20.0000 meq | EXTENDED_RELEASE_TABLET | Freq: Every day | ORAL | 1 refills | Status: DC

## 2017-01-29 MED ORDER — HEPARIN SOD (PORK) LOCK FLUSH 100 UNIT/ML IV SOLN
500.0000 [IU] | Freq: Once | INTRAVENOUS | Status: AC | PRN
  Administered 2017-01-29: 500 [IU] via INTRAVENOUS
  Filled 2017-01-29: qty 5

## 2017-01-29 MED ORDER — SODIUM CHLORIDE 0.9 % IJ SOLN
10.0000 mL | INTRAMUSCULAR | Status: DC | PRN
  Administered 2017-01-29: 10 mL via INTRAVENOUS
  Filled 2017-01-29: qty 10

## 2017-01-29 NOTE — Progress Notes (Signed)
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 She had mastectomy with the 7 axillary node evaluation, 4 cycles of adriamycin/ cytoxan followed by taxotere, then five years of tamoxifen thru June 2005 (no herceptin in 1999). She had bilateral oophorectomy in May 2005. She was briefly on aromatase inhibitor after tamoxifen, but discontinued this herself due to poor tolerance.      04/04/2015 Genetic Testing    Negative genetic testing on the breast/ovarian cancer pnael.  The Breast/Ovarian gene panel offered by [facility] includes sequencing and rearrangement analysis for the following 20 genes:  ATM, BARD1, BRCA1, BRCA2, BRIP1, CDH1, CHEK2, EPCAM, FANCC, MLH1, MSH2, MSH6, NBN, PALB2, PMS2, PTEN, RAD51C, RAD51D, TP53, and XRCC2.      06/26/2015 Pathology Results    Initial Path Report Bone, curettage, Left femur met, intramedullary subtroch tissue - METASTATIC ADENOCARCINOMA.  Estrogen Receptor: 95%, POSITIVE, STRONG STAINING INTENSITY Progesterone Receptor: 2%, POSITIVE, STRONG  HER2 (+), IHC 3+      06/26/2015 Surgery    Biopsy of metastatic tissue and stabilization with Affixus trochanteric nail, proximal and distal interlock of left femur by Dr. [name]       06/28/2015 Imaging    CT Chest w/ Contrast 1. Extensive right internal mammary chain lymphadenopathy consistent with metastatic breast cancer. 2. Lytic metastases involving the posterior elements at  T1 with probable nondisplaced pathologic fracture of the spinous process. 3. No other evidence of thoracic metastatic disease. 4. Trace bilateral pleural effusions with associated bibasilar atelectasis. 5. Indeterminate right thyroid nodule, not previously imaged. This could be evaluated with thyroid ultrasound as clinically warranted.      07/01/2015 Imaging    Bone Scan 1. Increased activity noted throughout the left femur. Although metastatic disease cannot be excluded, these changes are most likely secondary to prior surgery. 2. No other focal abnormalities identified to suggest metastatic disease.      07/12/2015 - 07/26/2015 Radiation Therapy    Left femur, 30 Gy in 10 fractions by Dr. Sondra Come       07/14/2015 Initial Diagnosis    [name], right ([place])      07/19/2015 Imaging    Initial PET Scan 1. Severe multifocal osseous metastatic disease, much greater than anticipated based on prior imaging. 2. Multifocal nodal metastases involving the right internal mammary, prevascular and subpectoral lymph nodes. No axillary pulmonary involvement identified. 3. No distant extra osseous metastases.      07/30/2015 - 11/29/2015 Chemotherapy    Docetaxel, Herceptin and Perjeta every 3 weeks X6 cycles, pt tolerated moderately well, restaging scan showed excellent response       09/18/2015 Imaging    CT Head w/wo Contrast 1. No acute intracranial abnormality or significant interval change. 2. Stable minimal periventricular white matter hypoattenuation on the right. This may reflect a remote ischemic injury. 3. No evidence for metastatic disease to the brain. 4. No focal soft tissue lesion to explain the patient's tenderness.  10/30/2015 Imaging    CT Abdomen Pelvis w/ Contrast 1. Few mildly prominent fluid-filled loops of small bowel scattered throughout the abdomen, with intraluminal fluid density within the distal colon. Given the provided history, findings  are suggestive of acute enteritis/diarrheal illness. 2. No other acute intra-abdominal or pelvic process identified. 3. Widespread osseous metastatic disease, better evaluated on most recent PET-CT from 07/19/2015. No pathologic fracture or other complication. 4. 11 mm hypodensity within the left kidney. This lesion measures intermediate density, and is indeterminate. While this lesion is similar in size relative to recent studies, this is increased in size relative to prior study from 2012. Further evaluation with dedicated renal mass protocol CT and/or MRI is recommended for complete characterization.      12/25/2015 Imaging    Restaging PET Scan 1. Markedly improved skeletal activity, with only several faint foci of residual accentuated metabolic activity, but resolution of the vast majority of the previously extensive osseous metastatic disease. 2. Resolution of the prior right internal mammary and right prevascular lymph nodes. 3. Residual subcutaneous edema and edema along fascia planes in the left upper thigh. This remains somewhat more than I would expect for placement of an IM nail 6 weeks ago. If there is leg swelling further down, consider Doppler venous ultrasound to rule out left lower extremity DVT. I do not perceive an obvious difference in density between the pelvic and common femoral veins on the noncontrast CT data. 4. Chronic right maxillary and right sphenoid sinusitis.      12/2015 -  Chemotherapy    Maintenance Herceptin and pejeta every 3 weeks      01/16/2016 -  Anti-estrogen oral therapy    Letrozole 2.5 mg daily      03/01/2016 Miscellaneous    Patient presented to ED following a fall; she reports she landed on her back and is now experiencing back pain. The patient was evaluated and discharge home same day with pain control.      05/01/2016 Imaging    CT CAP IMPRESSION: 1. Stable exam.  No new or progressive disease identified. 2. No evidence for residual or  recurrent adenopathy within the chest. 3. Stable bone metastasis.      05/01/2016 Imaging    BONE SCAN IMPRESSION: 1. Small focal uptake involving the anterior rib ends of the left second and right fourth ribs, new since the prior bone scan. The location of this uptake is more suggestive of a traumatic or inflammatory etiology as opposed to metastatic disease. No other evidence of metastatic disease. 2. There are other areas of stable uptake which are most likely degenerative/reactive, including right shoulder and cervical spine uptake and uptake along the right femur adjacent to the ORIF hardware.      05/20/2016 Imaging    NM PET Skull to Thigh IMPRESSION: 1. There multiple metastatic foci in the bony pelvis, including some new abnormal foci compare to the prior PET-CT, compatible with mildly progressive bony metastatic disease. Also, a left T11 vertebral hypermetabolic metastatic lesion is observed, new compared to the prior exam. 2. No active extraosseous malignancy is currently identified. 3. Right anterior fourth rib healing fracture, appears be      06/04/2016 -  Chemotherapy    Second line chemotherapy Kadcyla every 3 weeks started on 06/04/16       10/05/2016 Imaging    CT CAP and Bone Scan:  Bones: left intramedullary rod and left femoral head neck screw in place. In the proximal diaphysis of the left femur  there is cortical thickening and irregularity. No lytic or blastic bone lesions. No fracture or vertebral endplate destruction.   No definite evidence of recurrent disease in the chest, abdomen, or pelvis.      10/28/2016 Imaging    CT A/P IMPRESSION: No acute process demonstrated in the abdomen or pelvis. No evidence of bowel obstruction or inflammation.      11/04/2016 Imaging    DG foot complete left: IMPRESSION: No fracture or dislocation.  No soft tissue abnormality      11/23/2016 Imaging    CT RIGHT FEMUR IMPRESSION: Negative CT scan of the  right thigh.  No visible metastatic disease.  CT LEFT FEMUR IMPRESSION: 1. Postsurgical changes related to prior cephalomedullary rod fixation of the left femur with linear lucency along the anterolateral proximal femoral cortex at level of the lesser trochanter, suspicious for nondisplaced fracture. 2. Slightly permeative appearance of the proximal left femoral cortex at the site of known prior osseous metastases is largely unchanged. 3. Unchanged patchy lucency and sclerosis along the anterior acetabulum, which appears to correspond with an area of faint uptake on the prior PET-CT, suspicious for osseous metastasis. These results will be called to the ordering clinician or representative by the Radiologist Assistant, and communication documented in the PACS or zVision Dashboard.        01/27/2017 Imaging    IMPRESSION: 1. Mixed response to osseous metastasis. Some hypermetabolic lesions are new and increasingly hypermetabolic, while another index lesion is decreasingly hypermetabolic. 2. No evidence of hypermetabolic soft tissue metastasis.        HISTORY OF PRESENTING ILLNESS (From Dr. Mariana Kaufman note on 02/06/2016):  [name] 52 y.o. 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 She has had an excellent response to 6 cycles of taxotere herceptin perjeta from 07-30-15 thru 11-29-15. She is continuing herceptin and perjeta every 3 weeks. She had extreme local pain with first xgeva injection on 01-16-16 and prefers resuming previous zometa. She began letrozole ~ 01-16-16, has no complaints about this at all.  Last imaging was PET 12-25-15.  NOTE CTs, plain Xrays and bone scan did NOT show any of the extensively metastatic bone involvement 06-2015; MRI and PET did image disease clearly.  Patient continues to do very well overall. MRSA skin lesions on scalp and face improved  quickly with doxycycline starting 01-16-16. She continues to use Dial soap and we have again strongly encouraged good hygiene with other family members at home. She has had the 3 lymphedema PT sessions allowed by insurance, continues to do massage for LLE herself, still has not gotten the ordered compression apparatus for home and will follow up with PT on that. Swelling in LLE has improved with the interventions and does not seem as uncomfortable. She denies any pain. Appetite is excellent, bowels fine, no SOB, good energy, no problems with PAC, no noted changes in left breast. No bleeding. No fever or symptoms of infection. She slipped in tub last week, fell onto right arm fortunately without injury. She was upset by a visitor and shaky then, but no tremors otherwise.   She has two children and 4 grandchildren. She has had genetic testing and it was negative. She lives at home with her husband. She is very active. Her children live close to her.   CURRENT THERAPY:  Letrozole 2.'5mg'$  daily, second line Kadcyla every 3 weeks started on 06/04/16. Stpped letrozole and Kadcyla on  02/03/17. Start 25 mg Exemestane, 1500 mg lapatinib and herceptin every 3 weeks.   INTERIM HISTORY:  BERNADETTE GORES returns today for follow up and ongoing treatment. She presents to the clinic today with her interpreter. She reports that she is doing well overall. She notes that she is more active and able to do many daily activities. She reports she is sleeping well and taking her Potassium supplement.   On review of systems, pt denies any new pain, or any other complaints at this time. Pt denies abdominal pain, nausea, vomiting. Pertinent positives are listed and detailed within the above HPI.     MEDICAL HISTORY:  Past Medical History:  Diagnosis Date  . Abnormal Pap smear   . Anxiety   . Breast cancer ([place])   . Cancer ([place]) 1999   breast-s/p mastectomy, chemo, rad  . CIN I (cervical intraepithelial neoplasia I) 2003   by  [facility]  . Congenital deafness   . Deaf   . Depression   . Radiation 07/12/15-07/26/15   left femur 30 Gy  . S/P bilateral oophorectomy     SURGICAL HISTORY: Past Surgical History:  Procedure Laterality Date  . ABDOMINAL HYSTERECTOMY    . INTRAMEDULLARY (IM) NAIL INTERTROCHANTERIC Left 06/26/2015   Procedure: LEFT BIOMET LONG AFFIXS NAIL;  Surgeon: [name], MD;  Location: [place] [facility];  Service: Orthopedics;  Laterality: Left;  . IR GENERIC HISTORICAL  08/06/2015   IR US GUIDE VASC ACCESS LEFT 08/06/2015 Aletta Edouard, MD [facility]  . IR GENERIC HISTORICAL  08/06/2015   IR FLUORO GUIDE CV LINE LEFT 08/06/2015 Aletta Edouard, MD [facility]  . IR GENERIC HISTORICAL  03/05/2016   IR CV LINE INJECTION 03/05/2016 Markus Daft, MD [facility]  . MASTECTOMY     right breast  . MASTECTOMY    . OVARIAN CYST REMOVAL    . RADIOLOGY WITH ANESTHESIA Left 06/25/2015   Procedure: MRI OF LEFT HIP WITH OR WITHOUT CONTRAST;  Surgeon: Medication Radiologist, MD;  Location: [place];  Service: Radiology;  Laterality: Left;  DR. MCINTYRE/MRI  . TUBAL LIGATION      SOCIAL HISTORY: Social History   Socioeconomic History  . Marital status: Married    Spouse name: Not on file  . Number of children: 2  . Years of education: Not on file  . Highest education level: Not on file  Social Needs  . Financial resource strain: Not on file  . Food insecurity - worry: Not on file  . Food insecurity - inability: Not on file  . Transportation needs - medical: Not on file  . Transportation needs - non-medical: Not on file  Occupational History  . Not on file  Tobacco Use  . Smoking status: Never Smoker  . Smokeless tobacco: Never Used  Substance and Sexual Activity  . Alcohol use: No  . Drug use: No  . Sexual activity: Yes    Birth control/protection: Surgical  Other Topics Concern  . Not on file  Social History Narrative  . Not on file    FAMILY HISTORY: Family History  Problem Relation Age of Onset    . Hypertension Mother   . Seizures Mother   . Alzheimer's disease Maternal Uncle   . Pancreatitis Maternal Grandmother   . Heart attack Maternal Grandfather     ALLERGIES:  is allergic to trazodone and nefazodone; chlorhexidine; promethazine hcl; and diphenhydramine hcl.  MEDICATIONS:  Current Outpatient Medications  Medication Sig Dispense Refill  . Cholecalciferol 2000 units  CAPS Take 1 capsule by mouth daily.    . diazepam (VALIUM) 5 MG tablet TAKE 1 TABLET BY MOUTH AT BEDTIME AS NEEDED FOR ANXIETY 30 tablet 0  . divalproex (DEPAKOTE) 250 MG DR tablet Take 1 tablet (250 mg total) by mouth at bedtime. 30 tablet 2  . HYDROcodone-acetaminophen (NORCO) 5-325 MG tablet Take 1-2 tablets by mouth every 6 (six) hours as needed for moderate pain. 45 tablet 0  . HYDROmorphone (DILAUDID) 2 MG tablet Take 1 tablet (2 mg total) by mouth every 4 (four) hours as needed for severe pain. 30 tablet 0  . letrozole (FEMARA) 2.5 MG tablet Take 1 tablet (2.5 mg total) daily by mouth. 30 tablet 2  . lidocaine-prilocaine (EMLA) cream Apply 1 application as needed topically. Apply to Porta-Cath 1-2 hours prior to access as directed. 30 g 2  . potassium chloride (K-DUR) 10 MEQ tablet Take 2 tablets (20 mEq total) by mouth daily. 60 tablet 1  . traZODone (DESYREL) 50 MG tablet Take 1 tablet (50 mg total) at bedtime as needed by mouth for sleep. 30 tablet 3  . exemestane (AROMASIN) 25 MG tablet Take 1 tablet (25 mg total) by mouth daily after breakfast. 30 tablet 3  . lapatinib (TYKERB) 250 MG tablet Take 6 tablets (1,500 mg total) by mouth daily. 180 tablet 2  . prochlorperazine (COMPAZINE) 10 MG tablet Take 1 tablet (10 mg total) by mouth every 6 (six) hours as needed for nausea or vomiting. (Patient not taking: Reported on 01/29/2017) 30 tablet 2   No current facility-administered medications for this visit.    Facility-Administered Medications Ordered in Other Visits  Medication Dose Route Frequency Provider  Last Rate Last Dose  . sodium chloride 0.9 % 1,000 mL with potassium chloride 10 mEq infusion   Intravenous Continuous Gordy Levan, MD   Stopped at 09/10/15 1653  . sodium chloride 0.9 % injection 10 mL  10 mL Intravenous PRN Livesay, Lennis P, MD        REVIEW OF SYSTEMS:  Constitutional: Denies fevers, chills or abnormal night sweats  Eyes: Denies blurriness of vision, double vision or watery eyes Ears, nose, mouth, throat, and face: Denies mucositis or sore throat (+) deaf Respiratory: Denies cough, dyspnea or wheezes Cardiovascular: Denies palpitation, chest discomfort or lower extremity swelling Gastrointestinal:  Denies nausea, heartburn or change in bowel habits Lymphatics: Denies new lymphadenopathy or easy bruising. MSK: (+) Left leg pain  Behavioral/Psych: (+)mild anxiety All other systems were reviewed with the patient and are negative.  PHYSICAL EXAMINATION: ECOG PERFORMANCE STATUS: 1 BP (!) 116/59 (BP Location: Left Arm, Patient Position: Sitting)   Pulse 65   Temp (!) 97.5 F (36.4 C) (Oral)   Resp 18   Ht _0  (1.702 m)   Wt 129 lb 1.6 oz (58.6 kg)   SpO2 99%   BMI 20.22 kg/m   GENERAL:alert, no distress and comfortable  EYES: normal, conjunctiva are pink and non-injected, sclera clear OROPHARYNX:no exudate, no erythema and lips, buccal mucosa, and tongue normal  NECK: supple, thyroid normal size, non-tender, without nodularity LYMPH:  no palpable lymphadenopathy in the cervical, axillary or inguinal LUNGS: clear to auscultation and percussion with normal breathing effort HEART: regular rate & rhythm and no murmurs and no lower extremity edema ABDOMEN:abdomen soft, non-tender and normal bowel sounds Musculoskeletal:no cyanosis of digits and no clubbing  PSYCH: alert & oriented x 3 with fluent speech NEURO: no focal motor/sensory deficits Breasts: Breast inspection showed Right breast is surgically  absent status post reconstruction. Palpation of the  breasts and axilla revealed no obvious mass that I could appreciate.   LABORATORY DATA:  I have reviewed the data as listed CBC Latest Ref Rng & Units 02/01/2017 01/29/2017 01/08/2017  WBC 3.9 - 10.3 K/uL 4.7 4.4 4.7  Hemoglobin 11.6 - 15.9 g/dL 12.3 11.7 11.9  Hematocrit 34.8 - 46.6 % 36.9 34.5(L) 34.8  Platelets 145 - 400 K/uL 123(L) 123(L) 155   CMP Latest Ref Rng & Units 02/01/2017 01/29/2017 01/08/2017  Glucose 70 - 140 mg/dL 85 75 78  BUN 7 - 26 mg/dL 9 5(L) 10.1  Creatinine 0.60 - 1.10 mg/dL 0.79 0.82 0.9  Sodium 136 - 145 mmol/L 140 142 141  Potassium 3.3 - 4.7 mmol/L 3.8 3.2(L) 3.3(L)  Chloride 98 - 109 mmol/L 104 104 -  CO2 22 - 29 mmol/L _0 Calcium 8.4 - 10.4 mg/dL 9.4 8.9 9.2  Total Protein 6.4 - 8.3 g/dL 7.4 7.0 7.1  Total Bilirubin 0.2 - 1.2 mg/dL 1.1 1.2 0.95  Alkaline Phos 40 - 150 U/L 65 67 64  AST 5 - 34 U/L _1 ALT 0 - 55 U/L _2 PROCEDURES:  ECHO 12/17/16  Study Conclusions - Left ventricle: Inferobasal hypokinesis. The cavity size was   normal. Systolic function was normal. The estimated ejection   fraction was in the range of 50% to 55%. Wall motion was normal;   there were no regional wall motion abnormalities. Left   ventricular diastolic function parameters were normal. - Atrial septum: No defect or patent foramen ovale was identified. - Impressions: Normal GLS -17.7. Impressions: - Normal GLS -17.7.   Echocardiogram 05/21/2016 Study Conclusions  - Left ventricle: GLS is normal at -20.6% The cavity size was   normal. There was mild focal basal hypertrophy of the septum.   Systolic function was normal. The estimated ejection fraction was   55%. There is akinesis of the basalinferior myocardium. - Aortic valve: Trileaflet; normal thickness, mildly calcified   leaflets. There was trivial regurgitation. - Mitral valve: There was trivial regurgitation. - Tricuspid valve: There was mild regurgitation. - Pulmonic valve: There was  trivial regurgitation. - Pulmonary arteries: PA peak pressure: 37 mm Hg (S). Impressions: - The right ventricular systolic pressure was increased consistent   with mild pulmonary hypertension.   RADIOGRAPHIC STUDIES:  PET SCAN: 01/27/17 IMPRESSION: 1. Mixed response to osseous metastasis. Some hypermetabolic lesions are new and increasingly hypermetabolic, while another index lesion is decreasingly hypermetabolic. 2. No evidence of hypermetabolic soft tissue metastasis.  CT A/P IMPRESSION: 10/28/16 No acute process demonstrated in the abdomen or pelvis. No evidence of bowel obstruction or inflammation.  CT CAP and Bone Scan: 10/05/16  Bones: left intramedullary rod and left femoral head neck screw in place. In the proximal diaphysis of the left femur there is cortical thickening and irregularity. No lytic or blastic bone lesions. No fracture or vertebral endplate destruction.  No definite evidence of recurrent disease in the chest, abdomen, or pelvis.   PET 05/20/2016 IMPRESSION: 1. There multiple metastatic foci in the bony pelvis, including some new abnormal foci compare to the prior PET-CT, compatible with mildly progressive bony metastatic disease. Also, a left T11 vertebral hypermetabolic metastatic lesion is observed, new compared to the prior exam. 2. No active extraosseous malignancy is currently identified. 3. Right anterior fourth rib healing fracture, appears benign.  NM Bone Scan 05/01/2016 IMPRESSION: 1. Small focal uptake involving the anterior rib  ends of the left second and right fourth ribs, new since the prior bone scan. The location of this uptake is more suggestive of a traumatic or inflammatory etiology as opposed to metastatic disease. No other evidence of metastatic disease. 2. There are other areas of stable uptake which are most likely degenerative/reactive, including right shoulder and cervical spine uptake and uptake along the right femur adjacent  to the ORIF Hardware.  CT CAP 05/01/2016 IMPRESSION: 1. Stable exam.  No new or progressive disease identified. 2. No evidence for residual or recurrent adenopathy within the chest. 3. Stable bone metastasis.   I have personally reviewed the radiological images as listed and agreed with the findings in the report. Nm Pet Image Restag (ps) Skull Base To Thigh  Result Date: 01/28/2017 CLINICAL DATA:  Subsequent treatment strategy for restaging of breast cancer. Right breast primary with bone metastasis. EXAM: NUCLEAR MEDICINE PET SKULL BASE TO THIGH TECHNIQUE: 6.3 mCi F-18 FDG was injected intravenously. Full-ring PET imaging was performed from the skull base to thigh after the radiotracer. CT data was obtained and used for attenuation correction and anatomic localization. FASTING BLOOD GLUCOSE:  Value: 80 mg/dl COMPARISON:  PET of 05/20/2016.  Abdominopelvic CT 10/28/2016. FINDINGS: NECK: No areas of abnormal hypermetabolism.  No cervical adenopathy. CHEST: No pulmonary parenchymal or thoracic nodal hypermetabolism. A left-sided Port-A-Cath terminates at the high right atrium. Right mastectomy and breast reconstruction. Right axillary node dissection. ABDOMEN/PELVIS: No abdominopelvic parenchymal or nodal hypermetabolism. Cholecystectomy. Hysterectomy. SKELETON: Hypermetabolic osseous metastasis again identified. A lesion in the right-sided sacrum measures a S.U.V. max of 7.3 on image 146/series 4 and is new. Right acetabular lesion measures a S.U.V. max of 32.8 on image 169/series 4 versus a S.U.V. max of 14.8 on the prior. Left iliac index lesion measures a S.U.V. max of 3.8 on image 153/series 4 versus a S.U.V. max of 8.7 on the prior. Proximal left femoral fixation. IMPRESSION: 1. Mixed response to osseous metastasis. Some hypermetabolic lesions are new and increasingly hypermetabolic, while another index lesion is decreasingly hypermetabolic. 2. No evidence of hypermetabolic soft tissue metastasis.  Electronically Signed   By: Abigail Miyamoto M.D.   On: 01/28/2017 10:06     ASSESSMENT & PLAN:  Mrs. Palmatier is a 52 y.o. postmenopause female with:  1. 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 -Restaging PET 12-25-15 markedly improved of bone metastasis after initial systemic chemo  -she was switched to letrozole and maintenance Herceptin and the perjeta on 01-16-16.  -Did not tolerate SQ xgeva and it was changed to zometa  -Echocardiogram 01-27-16 showed normal EF, will continue monitoring every 3 months when she is on herceptin -I reviewed her CT scan and bone scan from 05/01/2016, which showed stable disease overall, a few new uptake of anterior ribs on bone scan are indeterminate. -She has developed worsening left hip pain, and more fatigued lately. I have clinical concern of disease progression. -I previously  discussed her restaging PET scan from 05/20/2016. 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 She declined other chemo.  -Labs reviewed, her bilirubin is slightly elevated but stable, adequate to continue treatment. 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  -She moved to [facility] for 3 months, back to [place] now  -We  received records from [facility] in [place], [place]. She underwent CT CAP w contrast on 10/02/16 and bone scan on 09/21/16. CT shows no evidence of recurrent disease in the chest, abdomen, or pelvis; Bone scan indicates no lytic or blastic bone lesions.  -She is tolerating Kadcyla well overall.  -She notes to having 1 episode of blood in her stool, will monitor closely. If she has another episode of bleeding I would recommend getting a colonoscopy sooner than later.  -She has been on Zometa every  4-6 weeks since June 2017, will change to every 3 months -PET Scan from 01/27/17 images were reviewed in person, it shows a mixed response to osseous metastasis, most bone lesions have improved comparing to 05/2016, but she has developed one new spine metastasis, and involved her pelvic bone lesion slightly increased in metabolism.  No other visceral organ metastasis. She is clinically doing well.  - I discussed that her mixed response is likely indicating her tumor is developing resistance to her treatment. We discussed Treatment options.  Due to her bone metastasis only disease, she is not symptomatic, and previous poor tolerance to chemotherapy, I will switch her antiestrogen therapy and HER-2 antibody alone, and preserve chemotherapy for future.  We will stop letrozole and Kadcyla and switch to Exemestane, oral HER2 antibody treatment lapatinib and herceptin. I discussed side effects of lapatinib including diarrhea and skin rash that she can use imodium and hydrocortisone.  She voiced good understanding, and agrees to proceed. -We again discussed the goal of therapy is palliative, to control her disease, and to prolong her life.  Her cancer is incurable at this stage.  She voiced understanding. -Labs today (02/01/17) CBC and CMP is WNL, platelets are 123K F/u in 4 weeks   2. History of MRSA skin infection scalp, boil at low back scalp and upper lip -I prescribed doxycycline on 04/24/16 for 7 days  - Resolved now.  3.congenital deafness -needs sign language interpreter for all visits.  -Husband also deaf.  4. premature surgical menopause -post BSO 2033fr pelvic pain and ovarian cysts, likely was also beneficial from standpoint of the breast cancer. -hysterectomy.   5. Intermittent anemia  -mild, possible related to prior chemo -She is asymptomatic, we'll continue close monitoring  6. Hypokalemia  -K 3.1 on 03/05/16, she is on KCL 264m daily, increased to 4037mdaily for a week. -K 3.2 on  04/03/16. The patient is on KCl 68m29maily, increased to 40me80mily for a week and then take 1 pill a day as normal. -K 3.3 on 07/16/16, continue KCL 68meq82mly  -received IV fluids in 11/25/16  -She does not tolerate liquid potassium so she will continue with tablet form. She will conitnues 20 mEq daily  -She has not been taking potassium due to the size. Her Potassium is 3.3 today (01/08/17). I recommend she can crush the potassium pill and mix it in a smoothie or dink.  -potassium is 3.8 today (02/01/17)   7. Goal of care discussion  -We previously discussed the incurable nature of her cancer, and the overall poor prognosis, especially if she does not have good response to chemotherapy or progress on chemo.  -The patient understands the goal of care is palliative. -she is full code for now   8. Left Leg Pain -she initially presented with worsening left leg and hip pain secondary to bone mets, she had surgery before, this was much improved after her prior chemo - The patient previously took Hydrocodone for  this pain, which helped. - I refilled Hydrocodone previously on 5/3. She will take 1 tablet every 6 hours as needed for pain. -Pain has recurred. Recent PET scan showed minimum uptake, unlikely related to bone mets progression  - I encouraged her to call Dr. [name] about the pain in her left leg. She has followed-up  With him.  -She requests to have bed for all infusions  -She is clinically doing well, left hip pain is well controlled, mild and intermittent overall.  9. Insomnia -I give her Ambien 87m, but it did not work well.  -continue ativan 1 mg to take at night as needed  -mirtazapine did not work, she also tried trazodone from PCP which gave her a rash. She stopped taking it.   10. Rectal bleeding  -She had a 1 day of rectal bleeding in December and 18, no recurrent rectal bleeding so far. -We will monitor clinically.  If she has recurrent rectal bleeding, I will schedule her  for colonoscopy.   Plan:   Due to mild disease progression, will stop letrozole and Kadcyla, switch to 25 mg Exemestane daily, 1500 mg lapatinib daily and herceptin every 3 weeks. Will schedule herceptin in one and 4 weeks  Lab, flush, f/u in 4 weeks    Pt had many questions. All questions were answered. The patient knows to call the clinic with any problems, questions or concerns.  I spent 20 minutes counseling the patient face to face. The total time spent in the appointment was 25 minutes and more than 50% was on counseling.  This document serves as a record of services personally performed by YTruitt Merle MD. It was created on her behalf by DTheresia Bough a trained medical scribe. The creation of this record is based on the scribe's personal observations and the provider's statements to them.   I have reviewed the above documentation for accuracy and completeness, and I agree with the above.    YTruitt Merle MD 02/01/2017

## 2017-01-29 NOTE — Telephone Encounter (Signed)
Gave patient apps per 1/18 los and avs and calendar. Patient did not want me to schedule herceptin for next week as she wanted to go over other options with Dr.Feng.

## 2017-01-30 LAB — CANCER ANTIGEN 27.29: CAN 27.29: 41.5 U/mL — AB (ref 0.0–38.6)

## 2017-02-01 ENCOUNTER — Inpatient Hospital Stay: Payer: Medicaid Other

## 2017-02-01 ENCOUNTER — Telehealth: Payer: Self-pay | Admitting: Hematology

## 2017-02-01 ENCOUNTER — Inpatient Hospital Stay (HOSPITAL_BASED_OUTPATIENT_CLINIC_OR_DEPARTMENT_OTHER): Payer: Medicaid Other | Admitting: Hematology

## 2017-02-01 VITALS — BP 116/59 | HR 65 | Temp 97.5°F | Resp 18 | Ht 67.0 in | Wt 129.1 lb

## 2017-02-01 DIAGNOSIS — Z923 Personal history of irradiation: Secondary | ICD-10-CM | POA: Diagnosis not present

## 2017-02-01 DIAGNOSIS — H905 Unspecified sensorineural hearing loss: Secondary | ICD-10-CM

## 2017-02-01 DIAGNOSIS — E876 Hypokalemia: Secondary | ICD-10-CM

## 2017-02-01 DIAGNOSIS — C50911 Malignant neoplasm of unspecified site of right female breast: Secondary | ICD-10-CM

## 2017-02-01 DIAGNOSIS — Z170 Estrogen receptor positive status [ER+]: Secondary | ICD-10-CM | POA: Diagnosis not present

## 2017-02-01 DIAGNOSIS — C799 Secondary malignant neoplasm of unspecified site: Principal | ICD-10-CM

## 2017-02-01 DIAGNOSIS — G893 Neoplasm related pain (acute) (chronic): Secondary | ICD-10-CM

## 2017-02-01 DIAGNOSIS — C50919 Malignant neoplasm of unspecified site of unspecified female breast: Secondary | ICD-10-CM

## 2017-02-01 DIAGNOSIS — Z8614 Personal history of Methicillin resistant Staphylococcus aureus infection: Secondary | ICD-10-CM | POA: Diagnosis not present

## 2017-02-01 DIAGNOSIS — C7951 Secondary malignant neoplasm of bone: Secondary | ICD-10-CM

## 2017-02-01 DIAGNOSIS — Z9221 Personal history of antineoplastic chemotherapy: Secondary | ICD-10-CM | POA: Diagnosis not present

## 2017-02-01 DIAGNOSIS — D696 Thrombocytopenia, unspecified: Secondary | ICD-10-CM | POA: Diagnosis not present

## 2017-02-01 DIAGNOSIS — D649 Anemia, unspecified: Secondary | ICD-10-CM

## 2017-02-01 DIAGNOSIS — Z9011 Acquired absence of right breast and nipple: Secondary | ICD-10-CM

## 2017-02-01 DIAGNOSIS — Z79899 Other long term (current) drug therapy: Secondary | ICD-10-CM

## 2017-02-01 DIAGNOSIS — Z95828 Presence of other vascular implants and grafts: Secondary | ICD-10-CM

## 2017-02-01 LAB — CBC WITH DIFFERENTIAL/PLATELET
BASOS ABS: 0 10*3/uL (ref 0.0–0.1)
BASOS PCT: 1 %
EOS ABS: 0.1 10*3/uL (ref 0.0–0.5)
Eosinophils Relative: 2 %
HCT: 36.9 % (ref 34.8–46.6)
HEMOGLOBIN: 12.3 g/dL (ref 11.6–15.9)
Lymphocytes Relative: 29 %
Lymphs Abs: 1.4 10*3/uL (ref 0.9–3.3)
MCH: 28.7 pg (ref 25.1–34.0)
MCHC: 33.2 g/dL (ref 31.5–36.0)
MCV: 86.3 fL (ref 79.5–101.0)
Monocytes Absolute: 0.3 10*3/uL (ref 0.1–0.9)
Monocytes Relative: 7 %
NEUTROS ABS: 2.8 10*3/uL (ref 1.5–6.5)
NEUTROS PCT: 61 %
Platelets: 123 10*3/uL — ABNORMAL LOW (ref 145–400)
RBC: 4.28 MIL/uL (ref 3.70–5.45)
RDW: 13.4 % (ref 11.2–16.1)
WBC: 4.7 10*3/uL (ref 3.9–10.3)

## 2017-02-01 LAB — COMPREHENSIVE METABOLIC PANEL
ALT: 26 U/L (ref 0–55)
AST: 29 U/L (ref 5–34)
Albumin: 3.5 g/dL (ref 3.5–5.0)
Alkaline Phosphatase: 65 U/L (ref 40–150)
Anion gap: 7 (ref 3–11)
BILIRUBIN TOTAL: 1.1 mg/dL (ref 0.2–1.2)
BUN: 9 mg/dL (ref 7–26)
CALCIUM: 9.4 mg/dL (ref 8.4–10.4)
CO2: 29 mmol/L (ref 22–29)
CREATININE: 0.79 mg/dL (ref 0.60–1.10)
Chloride: 104 mmol/L (ref 98–109)
Glucose, Bld: 85 mg/dL (ref 70–140)
Potassium: 3.8 mmol/L (ref 3.3–4.7)
Sodium: 140 mmol/L (ref 136–145)
TOTAL PROTEIN: 7.4 g/dL (ref 6.4–8.3)

## 2017-02-01 MED ORDER — EXEMESTANE 25 MG PO TABS: 25.0000 mg | ORAL_TABLET | Freq: Every day | ORAL | 3 refills | Status: DC

## 2017-02-01 MED ORDER — LAPATINIB DITOSYLATE 250 MG PO TABS: 1500.0000 mg | ORAL_TABLET | Freq: Every day | ORAL | 2 refills | Status: DC

## 2017-02-01 MED ORDER — HEPARIN SOD (PORK) LOCK FLUSH 100 UNIT/ML IV SOLN
500.0000 [IU] | Freq: Once | INTRAVENOUS | Status: AC | PRN
  Administered 2017-02-01: 500 [IU] via INTRAVENOUS
  Filled 2017-02-01: qty 5

## 2017-02-01 MED ORDER — SODIUM CHLORIDE 0.9 % IJ SOLN
10.0000 mL | INTRAMUSCULAR | Status: DC | PRN
  Administered 2017-02-01: 10 mL via INTRAVENOUS
  Filled 2017-02-01: qty 10

## 2017-02-01 NOTE — Patient Instructions (Signed)

## 2017-02-01 NOTE — Telephone Encounter (Signed)
Gave patient avs and calendar with appts per 1/21 los.  °

## 2017-02-02 ENCOUNTER — Encounter: Payer: Self-pay | Admitting: Hematology

## 2017-02-03 ENCOUNTER — Telehealth: Payer: Self-pay | Admitting: Pharmacist

## 2017-02-03 DIAGNOSIS — C50911 Malignant neoplasm of unspecified site of right female breast: Secondary | ICD-10-CM

## 2017-02-03 DIAGNOSIS — C7951 Secondary malignant neoplasm of bone: Principal | ICD-10-CM

## 2017-02-03 MED ORDER — LAPATINIB DITOSYLATE 250 MG PO TABS: 1500.0000 mg | ORAL_TABLET | Freq: Every day | ORAL | 2 refills | Status: DC

## 2017-02-03 NOTE — Telephone Encounter (Signed)
Oral Oncology Pharmacist Encounter  Received new prescription for Tykerb (lapatinib) for the treatment of triple-positive, metastatic breast cancer, progressed through multiple previous treatments, in conjunction with Aromasin and Herceptin, planned duration until disease progression or unacceptable toxicity.   Labs from 02/01/17 assessed, OK for treatment. 12/17/16 ECHO shows LVEF 55-60%, this will continue to be monitored as patient is also on therapy with Herceptin.  Current medication list in Epic reviewed, no DDIs with Tykerb identified.  Prescription has been e-scribed to the [facility] for benefits analysis and approval. Patient with Medicaid insurance. Anticipate $3 copayment for 1 month supply.  Discussed tablet size and pill burden with MD. This will be discussed with patient. Plan to perform initial counseling face-to-face with interpreter at infusion appointment on 02/08/17.  Oral Oncology Clinic will continue to follow for initial counseling and start date.  Johny Drilling, PharmD, BCPS, BCOP 02/03/2017 11:57 AM Oral Oncology Clinic [phone]

## 2017-02-08 ENCOUNTER — Telehealth: Payer: Self-pay | Admitting: Pharmacist

## 2017-02-08 ENCOUNTER — Other Ambulatory Visit: Payer: Self-pay | Admitting: Hematology

## 2017-02-08 ENCOUNTER — Inpatient Hospital Stay: Payer: Medicaid Other

## 2017-02-08 VITALS — HR 67 | Temp 97.7°F | Resp 17

## 2017-02-08 DIAGNOSIS — C50911 Malignant neoplasm of unspecified site of right female breast: Secondary | ICD-10-CM | POA: Diagnosis not present

## 2017-02-08 DIAGNOSIS — C7951 Secondary malignant neoplasm of bone: Principal | ICD-10-CM

## 2017-02-08 MED ORDER — LORATADINE 10 MG PO TABS
10.0000 mg | ORAL_TABLET | Freq: Every day | ORAL | Status: DC
  Administered 2017-02-08: 10 mg via ORAL
  Filled 2017-02-08: qty 1

## 2017-02-08 MED ORDER — SODIUM CHLORIDE 0.9 % IV SOLN: Freq: Once | INTRAVENOUS | Status: DC

## 2017-02-08 MED ORDER — SODIUM CHLORIDE 0.9% FLUSH
10.0000 mL | INTRAVENOUS | Status: DC | PRN
  Filled 2017-02-08: qty 10

## 2017-02-08 MED ORDER — HEPARIN SOD (PORK) LOCK FLUSH 100 UNIT/ML IV SOLN
500.0000 [IU] | Freq: Once | INTRAVENOUS | Status: DC | PRN
  Filled 2017-02-08: qty 5

## 2017-02-08 MED ORDER — SODIUM CHLORIDE 0.9 % IV SOLN
450.0000 mg | Freq: Once | INTRAVENOUS | Status: AC
  Administered 2017-02-08: 450 mg via INTRAVENOUS
  Filled 2017-02-08: qty 21.43

## 2017-02-08 MED ORDER — ACETAMINOPHEN 325 MG PO TABS
650.0000 mg | ORAL_TABLET | Freq: Once | ORAL | Status: AC
  Administered 2017-02-08: 650 mg via ORAL

## 2017-02-08 MED ORDER — SODIUM CHLORIDE 0.9 % IV SOLN
Freq: Once | INTRAVENOUS | Status: AC
  Administered 2017-02-08: 09:00:00 via INTRAVENOUS

## 2017-02-08 MED ORDER — ACETAMINOPHEN 325 MG PO TABS
ORAL_TABLET | ORAL | Status: AC
  Filled 2017-02-08: qty 2

## 2017-02-08 MED ORDER — SODIUM CHLORIDE 0.9 % IV SOLN: 3.4000 mg/kg | Freq: Once | INTRAVENOUS | Status: DC

## 2017-02-08 MED ORDER — DIPHENHYDRAMINE HCL 25 MG PO CAPS: 50.0000 mg | ORAL_CAPSULE | Freq: Once | ORAL | Status: DC

## 2017-02-08 MED ORDER — DIPHENHYDRAMINE HCL 25 MG PO CAPS
ORAL_CAPSULE | ORAL | Status: AC
  Filled 2017-02-08: qty 2

## 2017-02-08 MED ORDER — HEPARIN SOD (PORK) LOCK FLUSH 100 UNIT/ML IV SOLN
500.0000 [IU] | Freq: Once | INTRAVENOUS | Status: AC | PRN
  Administered 2017-02-08: 500 [IU]
  Filled 2017-02-08: qty 5

## 2017-02-08 MED ORDER — ACETAMINOPHEN 325 MG PO TABS: 650.0000 mg | ORAL_TABLET | Freq: Once | ORAL | Status: DC

## 2017-02-08 MED ORDER — SODIUM CHLORIDE 0.9% FLUSH
10.0000 mL | INTRAVENOUS | Status: DC | PRN
  Administered 2017-02-08: 10 mL
  Filled 2017-02-08: qty 10

## 2017-02-08 MED FILL — TYKERB 250 MG TABLET: 250 | 30 days supply | Qty: 180 | Fill #0

## 2017-02-08 NOTE — Patient Instructions (Addendum)
[facility] Discharge Instructions for Patients Receiving Chemotherapy  Today you received the following chemotherapy agents Herceptin  To help prevent nausea and vomiting after your treatment, we encourage you to take your nausea medication as directed   If you develop nausea and vomiting that is not controlled by your nausea medication, call the clinic.   BELOW ARE SYMPTOMS THAT SHOULD BE REPORTED IMMEDIATELY:  *FEVER GREATER THAN 100.5 F  *CHILLS WITH OR WITHOUT FEVER  NAUSEA AND VOMITING THAT IS NOT CONTROLLED WITH YOUR NAUSEA MEDICATION  *UNUSUAL SHORTNESS OF BREATH  *UNUSUAL BRUISING OR BLEEDING  TENDERNESS IN MOUTH AND THROAT WITH OR WITHOUT PRESENCE OF ULCERS  *URINARY PROBLEMS  *BOWEL PROBLEMS  UNUSUAL RASH Items with * indicate a potential emergency and should be followed up as soon as possible.  Feel free to call the clinic should you have any questions or concerns. The clinic phone number is [phone].  Please show the [facility] at check-in to the Emergency Department and triage nurse.  Trastuzumab injection for infusion (Herceptin) What is this medicine? TRASTUZUMAB (tras TOO zoo mab) is a monoclonal antibody. It is used to treat breast cancer and stomach cancer. This medicine may be used for other purposes; ask your health care provider or pharmacist if you have questions. COMMON BRAND NAME(S): Herceptin What should I tell my health care provider before I take this medicine? They need to know if you have any of these conditions: -heart disease -heart failure -lung or breathing disease, like asthma -an unusual or allergic reaction to trastuzumab, benzyl alcohol, or other medications, foods, dyes, or preservatives -pregnant or trying to get pregnant -breast-feeding How should I use this medicine? This drug is given as an infusion into a vein. It is administered in a hospital or clinic by a specially trained health care  professional. Talk to your pediatrician regarding the use of this medicine in children. This medicine is not approved for use in children. Overdosage: If you think you have taken too much of this medicine contact a poison control center or emergency room at once. NOTE: This medicine is only for you. Do not share this medicine with others. What if I miss a dose? It is important not to miss a dose. Call your doctor or health care professional if you are unable to keep an appointment. What may interact with this medicine? This medicine may interact with the following medications: -certain types of chemotherapy, such as daunorubicin, doxorubicin, epirubicin, and idarubicin This list may not describe all possible interactions. Give your health care provider a list of all the medicines, herbs, non-prescription drugs, or dietary supplements you use. Also tell them if you smoke, drink alcohol, or use illegal drugs. Some items may interact with your medicine. What should I watch for while using this medicine? Visit your doctor for checks on your progress. Report any side effects. Continue your course of treatment even though you feel ill unless your doctor tells you to stop. Call your doctor or health care professional for advice if you get a fever, chills or sore throat, or other symptoms of a cold or flu. Do not treat yourself. Try to avoid being around people who are sick. You may experience fever, chills and shaking during your first infusion. These effects are usually mild and can be treated with other medicines. Report any side effects during the infusion to your health care professional. Fever and chills usually do not happen with later infusions. Do not become pregnant  while taking this medicine or for 7 months after stopping it. Women should inform their doctor if they wish to become pregnant or think they might be pregnant. Women of child-bearing potential will need to have a negative pregnancy test  before starting this medicine. There is a potential for serious side effects to an unborn child. Talk to your health care professional or pharmacist for more information. Do not breast-feed an infant while taking this medicine or for 7 months after stopping it. Women must use effective birth control with this medicine. What side effects may I notice from receiving this medicine? Side effects that you should report to your doctor or health care professional as soon as possible: -allergic reactions like skin rash, itching or hives, swelling of the face, lips, or tongue -chest pain or palpitations -cough -dizziness -feeling faint or lightheaded, falls -fever -general ill feeling or flu-like symptoms -signs of worsening heart failure like breathing problems; swelling in your legs and feet -unusually weak or tired Side effects that usually do not require medical attention (report to your doctor or health care professional if they continue or are bothersome): -bone pain -changes in taste -diarrhea -joint pain -nausea/vomiting -weight loss This list may not describe all possible side effects. Call your doctor for medical advice about side effects. You may report side effects to FDA at 1-800-FDA-1088. Where should I keep my medicine? This drug is given in a hospital or clinic and will not be stored at home. NOTE: This sheet is a summary. It may not cover all possible information. If you have questions about this medicine, talk to your doctor, pharmacist, or health care provider.  2018 Elsevier/Gold Standard (2015-12-24 14:37:52)

## 2017-02-08 NOTE — Telephone Encounter (Signed)
Oral Oncology Pharmacist Encounter   I spoke with patient and sign language interpreter, Juliann Pulse, in infusion room for overview of: Tykerb.   Counseled patient on administration, dosing, side effects, monitoring, drug-food interactions, safe handling, storage, and disposal.  Patient will take Tykerb 250mg  tablets, 6 tablets (1500mg ) by mouth once daily on an empty stomach, 1 hour before or 2 hours after a meal.   Discussed pill burden at length. Patient states she thinks she is able to tolerate 6 pills daily.  Patient knows to avoid grapefruit and grapefruit juice.  Tykerb start date: planned for 02/10/17  Side effects include but not limited to: nausea, vomiting, diarrhea, prolonged QTc, rash, fatigue, and hand-foot syndrome.    Patient will obtain loperamide today as well as exemestane prescription. She will start exemestane tomorrow 02/09/17 and understands to stop letrozole.  Patient will pick up Tykerb prescription from the [facility] tomorrow (02/09/17) and will start it on Wednesday (1/30).  Reviewed with patient importance of keeping a medication schedule and plan for any missed doses.  Ceirra voiced understanding and appreciation.   All questions answered. Medication reconciliation performed and medication/allergy list updated.  Patient knows to call the office with questions or concerns.  Oral Oncology Clinic will continue to follow for insurance authorization, copayment issues, and start date.  Johny Drilling, PharmD, BCPS, BCOP 02/08/2017   11:50 AM Oral Oncology Clinic [phone]

## 2017-02-12 ENCOUNTER — Telehealth: Payer: Self-pay | Admitting: Pharmacist

## 2017-02-12 NOTE — Telephone Encounter (Signed)
Oral Oncology Pharmacist Encounter  Received call from patient, from aid of video phone interpreter, with questions about Tykerb therapy. Patient had received medication and was reading package insert. Specific questions about rates of anorexia, alopecia, xeroderma, and death with Tykerb usage. Multiple questions about medication regimen and whether or not she can tolerate her prescribed regimen. Answered questions to the best of my ability.  Patient started Tykerb today: 02/12/17  Patient states she is experiencing no side effects at the moment.  Patient knows to call the office with additional questions or concerns.  Oral Oncology Clinic will continue to follow.  Johny Drilling, PharmD, BCPS, BCOP 02/12/2017 11:12 AM Oral Oncology Clinic [phone]

## 2017-02-19 ENCOUNTER — Other Ambulatory Visit: Payer: Self-pay | Admitting: Internal Medicine

## 2017-02-19 DIAGNOSIS — G4700 Insomnia, unspecified: Secondary | ICD-10-CM

## 2017-02-19 NOTE — Telephone Encounter (Signed)
Pt called to the hospital operator to get refills on 4 medications. She gave the operator numbers that didn't make since. Can we have one of the nurses call and talk to the patient.

## 2017-02-19 NOTE — Telephone Encounter (Signed)
Pt contacted for clarification, pt is requesting a refill on the following pended medications. Please advise.

## 2017-02-22 ENCOUNTER — Telehealth: Payer: Self-pay | Admitting: *Deleted

## 2017-02-22 MED ORDER — HYDROMORPHONE HCL 2 MG PO TABS: 2.0000 mg | ORAL_TABLET | ORAL | 0 refills | Status: DC | PRN

## 2017-02-22 MED ORDER — DIAZEPAM 5 MG PO TABS: ORAL_TABLET | ORAL | 0 refills | Status: DC

## 2017-02-22 NOTE — Telephone Encounter (Signed)
Prescriptions placed up front for pickup.

## 2017-02-22 NOTE — Telephone Encounter (Signed)
Received call from pt this am relayed by operator, Lenna Sciara stating that pt doesn't want to take the chemo/Tykerb b/c it made her depressed/suicidal. Returned call & through deaf interpreter, pt reports taking tykerb sin 02/04/17 daily until last hs.  She states that she was in a dark place, had diarrhea, stomach cramping, no appetite & she looks emaciated & she wants to eat.  She states I do not want to take this med again.  Message to Dr Burr Medico.

## 2017-02-23 NOTE — Telephone Encounter (Signed)
Vm left on pts line stating paper prescriptions are up front for pick up at our office.

## 2017-02-26 NOTE — Progress Notes (Signed)
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 She had mastectomy with the 7 axillary node evaluation, 4 cycles of adriamycin/ cytoxan followed by taxotere, then five years of tamoxifen thru June 2005 (no herceptin in 1999). She had bilateral oophorectomy in May 52005. She was briefly on aromatase inhibitor after tamoxifen, but discontinued this herself due to poor tolerance.      04/04/2015 Genetic Testing    Negative genetic testing on the breast/ovarian cancer pnael.  The Breast/Ovarian gene panel offered by [facility] includes sequencing and rearrangement analysis for the following 20 genes:  ATM, BARD1, BRCA1, BRCA2, BRIP1, CDH1, CHEK2, EPCAM, FANCC, MLH1, MSH2, MSH6, NBN, PALB2, PMS2, PTEN, RAD51C, RAD51D, TP53, and XRCC2.      06/26/2015 Pathology Results    Initial Path Report Bone, curettage, Left femur met, intramedullary subtroch tissue - METASTATIC ADENOCARCINOMA.  Estrogen Receptor: 95%, POSITIVE, STRONG STAINING INTENSITY Progesterone Receptor: 2%, POSITIVE, STRONG  HER2 (+), IHC 3+      06/26/2015 Surgery    Biopsy of metastatic tissue and stabilization with Affixus trochanteric nail, proximal and distal interlock of left femur by Dr. [name]       06/28/2015 Imaging    CT Chest w/ Contrast 1. Extensive right internal mammary chain lymphadenopathy consistent with metastatic breast cancer. 2. Lytic metastases involving the posterior elements at  T1 with probable nondisplaced pathologic fracture of the spinous process. 3. No other evidence of thoracic metastatic disease. 4. Trace bilateral pleural effusions with associated bibasilar atelectasis. 5. Indeterminate right thyroid nodule, not previously imaged. This could be evaluated with thyroid ultrasound as clinically warranted.      07/01/2015 Imaging    Bone Scan 1. Increased activity noted throughout the left femur. Although metastatic disease cannot be excluded, these changes are most likely secondary to prior surgery. 2. No other focal abnormalities identified to suggest metastatic disease.      07/12/2015 - 07/26/2015 Radiation Therapy    Left femur, 30 Gy in 10 fractions by Dr. Sondra Come       07/14/2015 Initial Diagnosis    [name], right ([place])      07/19/2015 Imaging    Initial PET Scan 1. Severe multifocal osseous metastatic disease, much greater than anticipated based on prior imaging. 2. Multifocal nodal metastases involving the right internal mammary, prevascular and subpectoral lymph nodes. No axillary pulmonary involvement identified. 3. No distant extra osseous metastases.      07/30/2015 - 11/29/2015 Chemotherapy    Docetaxel, Herceptin and Perjeta every 3 weeks X6 cycles, pt tolerated moderately well, restaging scan showed excellent response       09/18/2015 Imaging    CT Head w/wo Contrast 1. No acute intracranial abnormality or significant interval change. 2. Stable minimal periventricular white matter hypoattenuation on the right. This may reflect a remote ischemic injury. 3. No evidence for metastatic disease to the brain. 4. No focal soft tissue lesion to explain the patient's tenderness.  10/30/2015 Imaging    CT Abdomen Pelvis w/ Contrast 1. Few mildly prominent fluid-filled loops of small bowel scattered throughout the abdomen, with intraluminal fluid density within the distal colon. Given the provided history, findings  are suggestive of acute enteritis/diarrheal illness. 2. No other acute intra-abdominal or pelvic process identified. 3. Widespread osseous metastatic disease, better evaluated on most recent PET-CT from 07/19/2015. No pathologic fracture or other complication. 4. 11 mm hypodensity within the left kidney. This lesion measures intermediate density, and is indeterminate. While this lesion is similar in size relative to recent studies, this is increased in size relative to prior study from 2012. Further evaluation with dedicated renal mass protocol CT and/or MRI is recommended for complete characterization.      12/25/2015 Imaging    Restaging PET Scan 1. Markedly improved skeletal activity, with only several faint foci of residual accentuated metabolic activity, but resolution of the vast majority of the previously extensive osseous metastatic disease. 2. Resolution of the prior right internal mammary and right prevascular lymph nodes. 3. Residual subcutaneous edema and edema along fascia planes in the left upper thigh. This remains somewhat more than I would expect for placement of an IM nail 6 weeks ago. If there is leg swelling further down, consider Doppler venous ultrasound to rule out left lower extremity DVT. I do not perceive an obvious difference in density between the pelvic and common femoral veins on the noncontrast CT data. 4. Chronic right maxillary and right sphenoid sinusitis.      12/2015 -  Chemotherapy    Maintenance Herceptin and pejeta every 3 weeks      01/16/2016 -  Anti-estrogen oral therapy    Letrozole 2.5 mg daily      03/01/2016 Miscellaneous    Patient presented to ED following a fall; she reports she landed on her back and is now experiencing back pain. The patient was evaluated and discharge home same day with pain control.      05/01/2016 Imaging    CT CAP IMPRESSION: 1. Stable exam.  No new or progressive disease identified. 2. No evidence for residual or  recurrent adenopathy within the chest. 3. Stable bone metastasis.      05/01/2016 Imaging    BONE SCAN IMPRESSION: 1. Small focal uptake involving the anterior rib ends of the left second and right fourth ribs, new since the prior bone scan. The location of this uptake is more suggestive of a traumatic or inflammatory etiology as opposed to metastatic disease. No other evidence of metastatic disease. 2. There are other areas of stable uptake which are most likely degenerative/reactive, including right shoulder and cervical spine uptake and uptake along the right femur adjacent to the ORIF hardware.      05/20/2016 Imaging    NM PET Skull to Thigh IMPRESSION: 1. There multiple metastatic foci in the bony pelvis, including some new abnormal foci compare to the prior PET-CT, compatible with mildly progressive bony metastatic disease. Also, a left T11 vertebral hypermetabolic metastatic lesion is observed, new compared to the prior exam. 2. No active extraosseous malignancy is currently identified. 3. Right anterior fourth rib healing fracture, appears be      06/04/2016 -  Chemotherapy    Second line chemotherapy Kadcyla every 3 weeks started on 06/04/16       10/05/2016 Imaging    CT CAP and Bone Scan:  Bones: left intramedullary rod and left femoral head neck screw in place. In the proximal diaphysis of the left femur  there is cortical thickening and irregularity. No lytic or blastic bone lesions. No fracture or vertebral endplate destruction.   No definite evidence of recurrent disease in the chest, abdomen, or pelvis.      10/28/2016 Imaging    CT A/P IMPRESSION: No acute process demonstrated in the abdomen or pelvis. No evidence of bowel obstruction or inflammation.      11/04/2016 Imaging    DG foot complete left: IMPRESSION: No fracture or dislocation.  No soft tissue abnormality      11/23/2016 Imaging    CT RIGHT FEMUR IMPRESSION: Negative CT scan of the  right thigh.  No visible metastatic disease.  CT LEFT FEMUR IMPRESSION: 1. Postsurgical changes related to prior cephalomedullary rod fixation of the left femur with linear lucency along the anterolateral proximal femoral cortex at level of the lesser trochanter, suspicious for nondisplaced fracture. 2. Slightly permeative appearance of the proximal left femoral cortex at the site of known prior osseous metastases is largely unchanged. 3. Unchanged patchy lucency and sclerosis along the anterior acetabulum, which appears to correspond with an area of faint uptake on the prior PET-CT, suspicious for osseous metastasis. These results will be called to the ordering clinician or representative by the Radiologist Assistant, and communication documented in the PACS or zVision Dashboard.        01/27/2017 Imaging    IMPRESSION: 1. Mixed response to osseous metastasis. Some hypermetabolic lesions are new and increasingly hypermetabolic, while another index lesion is decreasingly hypermetabolic. 2. No evidence of hypermetabolic soft tissue metastasis.        HISTORY OF PRESENTING ILLNESS (From Dr. Mariana Kaufman note on 02/06/2016):  [name] 52 y.o. 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 She has had an excellent response to 6 cycles of taxotere herceptin perjeta from 07-30-15 thru 11-29-15. She is continuing herceptin and perjeta every 3 weeks. She had extreme local pain with first xgeva injection on 01-16-16 and prefers resuming previous zometa. She began letrozole ~ 01-16-16, has no complaints about this at all.  Last imaging was PET 12-25-15.  NOTE CTs, plain Xrays and bone scan did NOT show any of the extensively metastatic bone involvement 06-2015; MRI and PET did image disease clearly.  Patient continues to do very well overall. MRSA skin lesions on scalp and face improved  quickly with doxycycline starting 01-16-16. She continues to use Dial soap and we have again strongly encouraged good hygiene with other family members at home. She has had the 3 lymphedema PT sessions allowed by insurance, continues to do massage for LLE herself, still has not gotten the ordered compression apparatus for home and will follow up with PT on that. Swelling in LLE has improved with the interventions and does not seem as uncomfortable. She denies any pain. Appetite is excellent, bowels fine, no SOB, good energy, no problems with PAC, no noted changes in left breast. No bleeding. No fever or symptoms of infection. She slipped in tub last week, fell onto right arm fortunately without injury. She was upset by a visitor and shaky then, but no tremors otherwise.   She has two children and 4 grandchildren. She has had genetic testing and it was negative. She lives at home with her husband. She is very active. Her children live close to her.   CURRENT THERAPY:  Letrozole 2.'5mg'$  daily, second line Kadcyla every 3 weeks started on 06/04/16. Stpped letrozole and Kadcyla on  02/03/17. Switched to 25 mg Exemestane, lapatinib and herceptin every 3 weeks.   INTERIM HISTORY:  [name] returns today for follow up and ongoing treatment. She presents to the clinic today with her interpreter and friend Shirlean Mylar. She reports that she is doing well overall. She notes to have stopped taking Tykerb after 1 week. She reports symptoms of depression, low appetite and diarrhea secondary to tykerb, and she stopped in in 4 months.  Her symptoms has resolved since she stopped the medication.  On review of systems, pt denies pain in her tailbone, or any other complaints at this time. Pertinent positives are listed and detailed within the above HPI.     MEDICAL HISTORY:  Past Medical History:  Diagnosis Date  . Abnormal Pap smear   . Anxiety   . Breast cancer ([place])   . Cancer ([place]) 1999   breast-s/p mastectomy,  chemo, rad  . CIN I (cervical intraepithelial neoplasia I) 2003   by colpo  . Congenital deafness   . Deaf   . Depression   . Radiation 07/12/15-07/26/15   left femur 30 Gy  . S/P bilateral oophorectomy     SURGICAL HISTORY: Past Surgical History:  Procedure Laterality Date  . ABDOMINAL HYSTERECTOMY    . INTRAMEDULLARY (IM) NAIL INTERTROCHANTERIC Left 06/26/2015   Procedure: LEFT BIOMET LONG AFFIXS NAIL;  Surgeon: [name], MD;  Location: [facility];  Service: Orthopedics;  Laterality: Left;  . IR GENERIC HISTORICAL  08/06/2015   IR US GUIDE VASC ACCESS LEFT 08/06/2015 Aletta Edouard, MD [facility]  . IR GENERIC HISTORICAL  08/06/2015   IR FLUORO GUIDE CV LINE LEFT 08/06/2015 Aletta Edouard, MD [facility]  . IR GENERIC HISTORICAL  03/05/2016   IR CV LINE INJECTION 03/05/2016 Markus Daft, MD [facility]  . MASTECTOMY     right breast  . MASTECTOMY    . OVARIAN CYST REMOVAL    . RADIOLOGY WITH ANESTHESIA Left 06/25/2015   Procedure: MRI OF LEFT HIP WITH OR WITHOUT CONTRAST;  Surgeon: Medication Radiologist, MD;  Location: [facility];  Service: Radiology;  Laterality: Left;  DR. MCINTYRE/MRI  . TUBAL LIGATION      SOCIAL HISTORY: Social History   Socioeconomic History  . Marital status: Married    Spouse name: Not on file  . Number of children: 2  . Years of education: Not on file  . Highest education level: Not on file  Social Needs  . Financial resource strain: Not on file  . Food insecurity - worry: Not on file  . Food insecurity - inability: Not on file  . Transportation needs - medical: Not on file  . Transportation needs - non-medical: Not on file  Occupational History  . Not on file  Tobacco Use  . Smoking status: Never Smoker  . Smokeless tobacco: Never Used  Substance and Sexual Activity  . Alcohol use: No  . Drug use: No  . Sexual activity: Yes    Birth control/protection: Surgical  Other Topics Concern  . Not on file  Social History Narrative  . Not on file      FAMILY HISTORY: Family History  Problem Relation Age of Onset  . Hypertension Mother   . Seizures Mother   . Alzheimer's disease Maternal Uncle   . Pancreatitis Maternal Grandmother   . Heart attack Maternal Grandfather     ALLERGIES:  is allergic to trazodone and nefazodone; chlorhexidine; promethazine hcl; and diphenhydramine hcl.  MEDICATIONS:  Current Outpatient Medications  Medication Sig Dispense Refill  . Cholecalciferol 2000 units CAPS Take 1 capsule by mouth daily.    . diazepam (VALIUM) 5 MG tablet TAKE 1 TABLET BY MOUTH AT BEDTIME AS NEEDED FOR ANXIETY 30 tablet 0  . divalproex (DEPAKOTE) 250 MG DR tablet Take 1 tablet (250 mg total) by mouth at bedtime. 30 tablet 2  . exemestane (AROMASIN) 25 MG tablet Take 1 tablet (25 mg total) by mouth daily after breakfast. 30 tablet 3  . HYDROcodone-acetaminophen (NORCO) 5-325 MG tablet Take 1-2 tablets by mouth every 6 (six) hours as needed for moderate pain. 45 tablet 0  . HYDROmorphone (DILAUDID) 2 MG tablet Take 1 tablet (2 mg total) by mouth every 4 (four) hours as needed for severe pain. 30 tablet 0  . lapatinib (TYKERB) 250 MG tablet Take 6 tablets (1,500 mg total) by mouth daily. Take on an empty stomach, 1 hour before or 2 hours after meals. 180 tablet 2  . lidocaine-prilocaine (EMLA) cream Apply 1 application as needed topically. Apply to Porta-Cath 1-2 hours prior to access as directed. 30 g 2  . potassium chloride (K-DUR) 10 MEQ tablet Take 2 tablets (20 mEq total) by mouth daily. 60 tablet 1  . prochlorperazine (COMPAZINE) 10 MG tablet Take 1 tablet (10 mg total) by mouth every 6 (six) hours as needed for nausea or vomiting. (Patient not taking: Reported on 01/29/2017) 30 tablet 2  . traZODone (DESYREL) 50 MG tablet Take 1 tablet (50 mg total) at bedtime as needed by mouth for sleep. 30 tablet 3   No current facility-administered medications for this visit.    Facility-Administered Medications Ordered in Other Visits   Medication Dose Route Frequency Provider Last Rate Last Dose  . sodium chloride 0.9 % 1,000 mL with potassium chloride 10 mEq infusion   Intravenous Continuous Gordy Levan, MD   Stopped at 09/10/15 1653  . sodium chloride 0.9 % injection 10 mL  10 mL Intravenous PRN Livesay, Lennis P, MD        REVIEW OF SYSTEMS:  Constitutional: Denies fevers, chills or abnormal night sweats  Eyes: Denies blurriness of vision, double vision or watery eyes Ears, nose, mouth, throat, and face: Denies mucositis or sore throat (+) deaf Respiratory: Denies cough, dyspnea or wheezes Cardiovascular: Denies palpitation, chest discomfort or lower extremity swelling Gastrointestinal:  Denies nausea, heartburn or change in bowel habits Lymphatics: Denies new lymphadenopathy or easy bruising. MSK: (+) Left leg pain  Behavioral/Psych: (+)mild anxiety All other systems were reviewed with the patient and are negative.  PHYSICAL EXAMINATION: ECOG PERFORMANCE STATUS: 1 BP 122/64 (BP Location: Left Arm, Patient Position: Sitting)   Pulse 64   Temp 97.6 F (36.4 C) (Oral)   Resp 16   Ht '5\' 7"'$  (1.702 m)   Wt 129 lb 1.6 oz (58.6 kg)   SpO2 98%   BMI 20.22 kg/m   GENERAL:alert, no distress and comfortable  EYES: normal, conjunctiva are pink and non-injected, sclera clear OROPHARYNX:no exudate, no erythema and lips, buccal mucosa, and tongue normal  NECK: supple, thyroid normal size, non-tender, without nodularity LYMPH:  no palpable lymphadenopathy in the cervical, axillary or inguinal LUNGS: clear to auscultation and percussion with normal breathing effort HEART: regular rate & rhythm and no murmurs and no lower extremity edema ABDOMEN:abdomen soft, non-tender and normal bowel sounds Musculoskeletal:no cyanosis of digits and no clubbing  PSYCH: alert & oriented x 3 with fluent speech NEURO: no focal motor/sensory deficits Breasts: Breast inspection showed Right breast is  surgically absent status post  reconstruction. Palpation of the breasts and axilla revealed no obvious mass that I could appreciate.   LABORATORY DATA:  I have reviewed the data as listed CBC Latest Ref Rng & Units 03/01/2017 02/01/2017 01/29/2017  WBC 3.9 - 10.3 K/uL 3.9 4.7 4.4  Hemoglobin 11.6 - 15.9 g/dL 11.3(L) 12.3 11.7  Hematocrit 34.8 - 46.6 % 34.1(L) 36.9 34.5(L)  Platelets 145 - 400 K/uL 122(L) 123(L) 123(L)   CMP Latest Ref Rng & Units 03/01/2017 02/01/2017 01/29/2017  Glucose 70 - 140 mg/dL 80 85 75  BUN 7 - 26 mg/dL 5(L) 9 5(L)  Creatinine 0.60 - 1.10 mg/dL 0.83 0.79 0.82  Sodium 136 - 145 mmol/L 143 140 142  Potassium 3.5 - 5.1 mmol/L 3.6 3.8 3.2(L)  Chloride 98 - 109 mmol/L 107 104 104  CO2 22 - 29 mmol/L '27 29 28  '$ Calcium 8.4 - 10.4 mg/dL 9.2 9.4 8.9  Total Protein 6.4 - 8.3 g/dL 6.4 7.4 7.0  Total Bilirubin 0.2 - 1.2 mg/dL 0.8 1.1 1.2  Alkaline Phos 40 - 150 U/L 65 65 67  AST 5 - 34 U/L '21 29 28  '$ ALT 0 - 55 U/L '13 26 25   '$ PROCEDURES:  ECHO 12/17/16  Study Conclusions - Left ventricle: Inferobasal hypokinesis. The cavity size was   normal. Systolic function was normal. The estimated ejection   fraction was in the range of 50% to 55%. Wall motion was normal;   there were no regional wall motion abnormalities. Left   ventricular diastolic function parameters were normal. - Atrial septum: No defect or patent foramen ovale was identified. - Impressions: Normal GLS -17.7. Impressions: - Normal GLS -17.7.   Echocardiogram 05/21/2016 Study Conclusions  - Left ventricle: GLS is normal at -20.6% The cavity size was   normal. There was mild focal basal hypertrophy of the septum.   Systolic function was normal. The estimated ejection fraction was   55%. There is akinesis of the basalinferior myocardium. - Aortic valve: Trileaflet; normal thickness, mildly calcified   leaflets. There was trivial regurgitation. - Mitral valve: There was trivial regurgitation. - Tricuspid valve: There was mild  regurgitation. - Pulmonic valve: There was trivial regurgitation. - Pulmonary arteries: PA peak pressure: 37 mm Hg (S). Impressions: - The right ventricular systolic pressure was increased consistent   with mild pulmonary hypertension.   RADIOGRAPHIC STUDIES:  PET SCAN: 01/27/17 IMPRESSION: 1. Mixed response to osseous metastasis. Some hypermetabolic lesions are new and increasingly hypermetabolic, while another index lesion is decreasingly hypermetabolic. 2. No evidence of hypermetabolic soft tissue metastasis.  CT A/P IMPRESSION: 10/28/16 No acute process demonstrated in the abdomen or pelvis. No evidence of bowel obstruction or inflammation.  CT CAP and Bone Scan: 10/05/16  Bones: left intramedullary rod and left femoral head neck screw in place. In the proximal diaphysis of the left femur there is cortical thickening and irregularity. No lytic or blastic bone lesions. No fracture or vertebral endplate destruction.  No definite evidence of recurrent disease in the chest, abdomen, or pelvis.   PET 05/20/2016 IMPRESSION: 1. There multiple metastatic foci in the bony pelvis, including some new abnormal foci compare to the prior PET-CT, compatible with mildly progressive bony metastatic disease. Also, a left T11 vertebral hypermetabolic metastatic lesion is observed, new compared to the prior exam. 2. No active extraosseous malignancy is currently identified. 3. Right anterior fourth rib healing fracture, appears benign.  NM Bone Scan 05/01/2016 IMPRESSION: 1. Small focal uptake involving the anterior  rib ends of the left second and right fourth ribs, new since the prior bone scan. The location of this uptake is more suggestive of a traumatic or inflammatory etiology as opposed to metastatic disease. No other evidence of metastatic disease. 2. There are other areas of stable uptake which are most likely degenerative/reactive, including right shoulder and cervical spine uptake  and uptake along the right femur adjacent to the ORIF Hardware.  CT CAP 05/01/2016 IMPRESSION: 1. Stable exam.  No new or progressive disease identified. 2. No evidence for residual or recurrent adenopathy within the chest. 3. Stable bone metastasis.   I have personally reviewed the radiological images as listed and agreed with the findings in the report. No results found.   ASSESSMENT & PLAN:  [name] is a 52 y.o. postmenopause female with:  1. 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 -Restaging PET 12-25-15 markedly improved of bone metastasis after initial systemic chemo  -she was switched to letrozole and maintenance Herceptin and the perjeta on 01-16-16.  -Did not tolerate SQ xgeva and it was changed to zometa  -Echocardiogram 01-27-16 showed normal EF, will continue monitoring every 3 months when she is on herceptin -I reviewed her CT scan and bone scan from 05/01/2016, which showed stable disease overall, a few new uptake of anterior ribs on bone scan are indeterminate. -She has developed worsening left hip pain, and more fatigued lately. I have clinical concern of disease progression. -I previously  discussed her restaging PET scan from 05/20/2016. 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 She declined other chemo.  -Labs reviewed, her bilirubin is slightly elevated but stable, adequate to continue treatment. 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  -She moved to [facility] for 3 months, back to [place] now  -We received records from [facility] in [place], [place]. She underwent CT CAP w contrast on 10/02/16 and bone scan on 09/21/16. CT shows no evidence of recurrent disease in the chest, abdomen, or  pelvis; Bone scan indicates no lytic or blastic bone lesions.  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  No other visceral organ metastasis. She is clinically doing well.  - I discussed that her mixed response is likely indicating her tumor is developing resistance to her Kaycyla. We discussed Treatment options.  Due to her bone metastasis only disease, she is not symptomatic, and previous poor tolerance to chemotherapy, I will switch her antiestrogen therapy and HER-2 antibody alone, and preserve chemotherapy for future.  We will stop letrozole and Kadcyla and switch to Exemestane, oral HER2 antibody treatment lapatinib and herceptin. I discussed side effects of lapatinib including diarrhea and skin rash that she can use imodium and hydrocortisone.  She voiced good understanding, and agrees to proceed. -We again discussed the goal of therapy is palliative, to control her disease, and to prolong her life.  Her cancer is incurable at this stage.  She voiced understanding. -She had severe  side effects of diarrhea, depression and low appetite with lapatinib on full dose of 1500 mg after 1 week and she stopped. I recommend her to take 500 mg for 1 week and then increase to  750 mg if can tolerable. I will see her back in 3 weeks and plan to increase to 1000 mg. I discussed that 500 mg is too low of a dose and hopefully she can tolerate 1000 mg.  Initially was very low at 2 take it, after long discussion, she agreed to try again. She knows to call with any questions or concerns.  -if she can not tolerated reduced dose lapatinib, will add perjeta to herceptin then  -Continue with herceptin  today -Labs today (03/01/17) her Hgb is 11.3, platelets are 122K, will proceed Herceptin therapy today. - F/u in 3 weeks   2. History of MRSA skin infection scalp, boil at low back scalp and upper lip -I prescribed doxycycline on 04/24/16 for 7 days  - Resolved now.  3.congenital deafness -needs sign language interpreter for all visits.  -Husband also deaf.  4. premature surgical menopause -post BSO 2047fr pelvic pain and ovarian cysts, likely was also beneficial from standpoint of the breast cancer. -hysterectomy.   5. Intermittent anemia  -mild, possible related to prior chemo -She is asymptomatic, we'll continue close monitoring  6. Hypokalemia  -K 3.1 on 03/05/16, she is on KCL 242m daily, increased to 4050mdaily for a week. -K 3.2 on 04/03/16. The patient is on KCl 72m65maily, increased to 40me66mily for a week and then take 1 pill a day as normal. -K 3.3 on 07/16/16, continue KCL 72meq38mly  -received IV fluids in 11/25/16  -She does not tolerate liquid potassium so she will continue with tablet form. She will conitnues 20 mEq daily  -She has not been taking potassium due to the size. Her Potassium is 3.3 today (01/08/17). I recommend she can crush the potassium pill and mix it in a smoothie or dink.  -potassium is pending today   7. Goal of care discussion  -We previously discussed the incurable nature of her cancer, and the overall poor prognosis, especially if she does not have good response to chemotherapy or progress on chemo.  -The patient understands the goal of care is palliative. -she is full code for now   8. Left Leg Pain -she initially presented with worsening left leg and hip pain secondary to bone mets, she had surgery before, this was much improved after her prior chemo - The patient previously took Hydrocodone for this pain, which helped. - I refilled Hydrocodone previously on 5/3. She will take 1 tablet every 6 hours as needed for pain. -Pain has  recurred. Recent PET scan showed minimum uptake, unlikely related to bone mets progression  - I encouraged her to call Dr. Yates [name] the pain in her left leg. She has followed-up  With him.  -She requests to have bed for all infusions  -She is clinically doing well, left hip pain is well controlled, mild and intermittent overall.  9. Insomnia -I give her Ambien '5mg'$ , but it did not work well.  -continue ativan 1 mg to take at night as needed  -mirtazapine did not work, she also tried trazodone from PCP which gave her a rash. She stopped taking it.   10. Rectal bleeding  -She had a 1 day of rectal bleeding in December and 18, no recurrent rectal bleeding so far. -We will monitor clinically.  If  she has recurrent rectal bleeding, I will schedule her for colonoscopy.   Plan:   -re-start lapatinib at reduced dose 500 mg daily for 1 week, increase to 750 mg after 1 week  -continue 25 mg Exemestane daily -will proceed Herceptin today and continue every 3 weeks -Lab, flush and f/u in 3 weeks with herceptin    Pt had many questions. All questions were answered. The patient knows to call the clinic with any problems, questions or concerns.  I spent 20 minutes counseling the patient face to face. The total time spent in the appointment was 25 minutes and more than 50% was on counseling.  This document serves as a record of services personally performed by Truitt Merle, MD. It was created on her behalf by Theresia Bough, a trained medical scribe. The creation of this record is based on the scribe's personal observations and the provider's statements to them.   I have reviewed the above documentation for accuracy and completeness, and I agree with the above.    Truitt Merle, MD 03/01/2017

## 2017-03-01 ENCOUNTER — Encounter: Payer: Self-pay | Admitting: Hematology

## 2017-03-01 ENCOUNTER — Telehealth: Payer: Self-pay | Admitting: Hematology

## 2017-03-01 ENCOUNTER — Inpatient Hospital Stay: Payer: Medicaid Other

## 2017-03-01 ENCOUNTER — Inpatient Hospital Stay: Payer: Medicaid Other | Attending: Hematology | Admitting: Hematology

## 2017-03-01 VITALS — BP 122/64 | HR 64 | Temp 97.6°F | Resp 16 | Ht 67.0 in | Wt 129.1 lb

## 2017-03-01 DIAGNOSIS — Z853 Personal history of malignant neoplasm of breast: Secondary | ICD-10-CM | POA: Insufficient documentation

## 2017-03-01 DIAGNOSIS — Z8614 Personal history of Methicillin resistant Staphylococcus aureus infection: Secondary | ICD-10-CM | POA: Insufficient documentation

## 2017-03-01 DIAGNOSIS — D649 Anemia, unspecified: Secondary | ICD-10-CM | POA: Insufficient documentation

## 2017-03-01 DIAGNOSIS — E876 Hypokalemia: Secondary | ICD-10-CM | POA: Diagnosis not present

## 2017-03-01 DIAGNOSIS — C7951 Secondary malignant neoplasm of bone: Principal | ICD-10-CM

## 2017-03-01 DIAGNOSIS — Z95828 Presence of other vascular implants and grafts: Secondary | ICD-10-CM

## 2017-03-01 DIAGNOSIS — C50911 Malignant neoplasm of unspecified site of right female breast: Secondary | ICD-10-CM

## 2017-03-01 DIAGNOSIS — Z79899 Other long term (current) drug therapy: Secondary | ICD-10-CM | POA: Diagnosis not present

## 2017-03-01 DIAGNOSIS — Z9221 Personal history of antineoplastic chemotherapy: Secondary | ICD-10-CM | POA: Diagnosis not present

## 2017-03-01 DIAGNOSIS — G4700 Insomnia, unspecified: Secondary | ICD-10-CM | POA: Insufficient documentation

## 2017-03-01 DIAGNOSIS — Z170 Estrogen receptor positive status [ER+]: Secondary | ICD-10-CM | POA: Diagnosis not present

## 2017-03-01 DIAGNOSIS — Z9011 Acquired absence of right breast and nipple: Secondary | ICD-10-CM

## 2017-03-01 DIAGNOSIS — C50919 Malignant neoplasm of unspecified site of unspecified female breast: Secondary | ICD-10-CM

## 2017-03-01 DIAGNOSIS — C799 Secondary malignant neoplasm of unspecified site: Principal | ICD-10-CM

## 2017-03-01 LAB — COMPREHENSIVE METABOLIC PANEL
ALBUMIN: 3.2 g/dL — AB (ref 3.5–5.0)
ALK PHOS: 65 U/L (ref 40–150)
ALT: 13 U/L (ref 0–55)
AST: 21 U/L (ref 5–34)
Anion gap: 9 (ref 3–11)
BILIRUBIN TOTAL: 0.8 mg/dL (ref 0.2–1.2)
BUN: 5 mg/dL — AB (ref 7–26)
CALCIUM: 9.2 mg/dL (ref 8.4–10.4)
CO2: 27 mmol/L (ref 22–29)
Chloride: 107 mmol/L (ref 98–109)
Creatinine, Ser: 0.83 mg/dL (ref 0.60–1.10)
GFR calc Af Amer: >60 mL/min (ref >60)
GLUCOSE: 80 mg/dL (ref 70–140)
POTASSIUM: 3.6 mmol/L (ref 3.5–5.1)
Sodium: 143 mmol/L (ref 136–145)
TOTAL PROTEIN: 6.4 g/dL (ref 6.4–8.3)

## 2017-03-01 LAB — CBC WITH DIFFERENTIAL/PLATELET
BASOS ABS: 0 10*3/uL (ref 0.0–0.1)
Basophils Relative: 0 %
Eosinophils Absolute: 0.2 10*3/uL (ref 0.0–0.5)
Eosinophils Relative: 4 %
HEMATOCRIT: 34.1 % — AB (ref 34.8–46.6)
HEMOGLOBIN: 11.3 g/dL — AB (ref 11.6–15.9)
LYMPHS PCT: 41 %
Lymphs Abs: 1.6 10*3/uL (ref 0.9–3.3)
MCH: 29.5 pg (ref 25.1–34.0)
MCHC: 33.1 g/dL (ref 31.5–36.0)
MCV: 89 fL (ref 79.5–101.0)
MONO ABS: 0.2 10*3/uL (ref 0.1–0.9)
MONOS PCT: 6 %
NEUTROS PCT: 49 %
Neutro Abs: 1.9 10*3/uL (ref 1.5–6.5)
Platelets: 122 10*3/uL — ABNORMAL LOW (ref 145–400)
RBC: 3.83 MIL/uL (ref 3.70–5.45)
RDW: 13.2 % (ref 11.2–14.5)
WBC: 3.9 10*3/uL (ref 3.9–10.3)

## 2017-03-01 MED ORDER — ZOLEDRONIC ACID 4 MG/5ML IV CONC: 4.0000 mg | Freq: Once | INTRAVENOUS | Status: DC

## 2017-03-01 MED ORDER — SODIUM CHLORIDE 0.9% FLUSH
10.0000 mL | INTRAVENOUS | Status: DC | PRN
  Administered 2017-03-01: 10 mL
  Filled 2017-03-01: qty 10

## 2017-03-01 MED ORDER — ACETAMINOPHEN 325 MG PO TABS
650.0000 mg | ORAL_TABLET | Freq: Once | ORAL | Status: AC
  Administered 2017-03-01: 650 mg via ORAL

## 2017-03-01 MED ORDER — LORATADINE 10 MG PO TABS
10.0000 mg | ORAL_TABLET | Freq: Every day | ORAL | Status: DC
  Administered 2017-03-01: 10 mg via ORAL
  Filled 2017-03-01: qty 1

## 2017-03-01 MED ORDER — SODIUM CHLORIDE 0.9 % IJ SOLN
10.0000 mL | INTRAMUSCULAR | Status: DC | PRN
  Administered 2017-03-01: 10 mL via INTRAVENOUS
  Filled 2017-03-01: qty 10

## 2017-03-01 MED ORDER — ACETAMINOPHEN 325 MG PO TABS
ORAL_TABLET | ORAL | Status: AC
  Filled 2017-03-01: qty 2

## 2017-03-01 MED ORDER — HEPARIN SOD (PORK) LOCK FLUSH 100 UNIT/ML IV SOLN
500.0000 [IU] | Freq: Once | INTRAVENOUS | Status: AC | PRN
  Administered 2017-03-01: 500 [IU]
  Filled 2017-03-01: qty 5

## 2017-03-01 MED ORDER — SODIUM CHLORIDE 0.9 % IV SOLN
Freq: Once | INTRAVENOUS | Status: AC
  Administered 2017-03-01: 11:00:00 via INTRAVENOUS

## 2017-03-01 MED ORDER — SODIUM CHLORIDE 0.9 % IV SOLN
6.0000 mg/kg | Freq: Once | INTRAVENOUS | Status: AC
  Administered 2017-03-01: 357 mg via INTRAVENOUS
  Filled 2017-03-01: qty 17

## 2017-03-01 NOTE — Patient Instructions (Signed)
[facility] Discharge Instructions for Patients Receiving Chemotherapy  Today you received the following chemotherapy agents Herceptin and Zometa  To help prevent nausea and vomiting after your treatment, we encourage you to take your nausea medication as directed    If you develop nausea and vomiting that is not controlled by your nausea medication, call the clinic.   BELOW ARE SYMPTOMS THAT SHOULD BE REPORTED IMMEDIATELY:  *FEVER GREATER THAN 100.5 F  *CHILLS WITH OR WITHOUT FEVER  NAUSEA AND VOMITING THAT IS NOT CONTROLLED WITH YOUR NAUSEA MEDICATION  *UNUSUAL SHORTNESS OF BREATH  *UNUSUAL BRUISING OR BLEEDING  TENDERNESS IN MOUTH AND THROAT WITH OR WITHOUT PRESENCE OF ULCERS  *URINARY PROBLEMS  *BOWEL PROBLEMS  UNUSUAL RASH Items with * indicate a potential emergency and should be followed up as soon as possible.  Feel free to call the clinic should you have any questions or concerns. The clinic phone number is [phone].  Please show the CHEMO ALERT CARD at check-in to the Emergency Department and triage nurse.    

## 2017-03-01 NOTE — Telephone Encounter (Signed)
Scheduled appt per 2/18 los- Gave patient AVS And calender per los.

## 2017-03-20 NOTE — Progress Notes (Signed)
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 She had mastectomy with the 7 axillary node evaluation, 4 cycles of adriamycin/ cytoxan followed by taxotere, then five years of tamoxifen thru June 2005 (no herceptin in 1999). She had bilateral oophorectomy in May 2005. She was briefly on aromatase inhibitor after tamoxifen, but discontinued this herself due to poor tolerance.      04/04/2015 Genetic Testing    Negative genetic testing on the breast/ovarian cancer pnael.  The Breast/Ovarian gene panel offered by [facility] includes sequencing and rearrangement analysis for the following 20 genes:  ATM, BARD1, BRCA1, BRCA2, BRIP1, CDH1, CHEK2, EPCAM, FANCC, MLH1, MSH2, MSH6, NBN, PALB2, PMS2, PTEN, RAD51C, RAD51D, TP53, and XRCC2.      06/26/2015 Pathology Results    Initial Path Report Bone, curettage, Left femur met, intramedullary subtroch tissue - METASTATIC ADENOCARCINOMA.  Estrogen Receptor: 95%, POSITIVE, STRONG STAINING INTENSITY Progesterone Receptor: 2%, POSITIVE, STRONG  HER2 (+), IHC 3+      06/26/2015 Surgery    Biopsy of metastatic tissue and stabilization with Affixus trochanteric nail, proximal and distal interlock of left femur by Dr. [name]       06/28/2015 Imaging    CT Chest w/ Contrast 1. Extensive right internal mammary chain lymphadenopathy consistent with metastatic breast cancer. 2. Lytic metastases involving the posterior elements at  T1 with probable nondisplaced pathologic fracture of the spinous process. 3. No other evidence of thoracic metastatic disease. 4. Trace bilateral pleural effusions with associated bibasilar atelectasis. 5. Indeterminate right thyroid nodule, not previously imaged. This could be evaluated with thyroid ultrasound as clinically warranted.      07/01/2015 Imaging    Bone Scan 1. Increased activity noted throughout the left femur. Although metastatic disease cannot be excluded, these changes are most likely secondary to prior surgery. 2. No other focal abnormalities identified to suggest metastatic disease.      07/12/2015 - 07/26/2015 Radiation Therapy    Left femur, 30 Gy in 10 fractions by Dr. Sondra Come       07/14/2015 Initial Diagnosis    [name], right ([place])      07/19/2015 Imaging    Initial PET Scan 1. Severe multifocal osseous metastatic disease, much greater than anticipated based on prior imaging. 2. Multifocal nodal metastases involving the right internal mammary, prevascular and subpectoral lymph nodes. No axillary pulmonary involvement identified. 3. No distant extra osseous metastases.      07/30/2015 - 11/29/2015 Chemotherapy    Docetaxel, Herceptin and Perjeta every 3 weeks X6 cycles, pt tolerated moderately well, restaging scan showed excellent response       09/18/2015 Imaging    CT Head w/wo Contrast 1. No acute intracranial abnormality or significant interval change. 2. Stable minimal periventricular white matter hypoattenuation on the right. This may reflect a remote ischemic injury. 3. No evidence for metastatic disease to the brain. 4. No focal soft tissue lesion to explain the patient's tenderness.  10/30/2015 Imaging    CT Abdomen Pelvis w/ Contrast 1. Few mildly prominent fluid-filled loops of small bowel scattered throughout the abdomen, with intraluminal fluid density within the distal colon. Given the provided history, findings  are suggestive of acute enteritis/diarrheal illness. 2. No other acute intra-abdominal or pelvic process identified. 3. Widespread osseous metastatic disease, better evaluated on most recent PET-CT from 07/19/2015. No pathologic fracture or other complication. 4. 11 mm hypodensity within the left kidney. This lesion measures intermediate density, and is indeterminate. While this lesion is similar in size relative to recent studies, this is increased in size relative to prior study from 2012. Further evaluation with dedicated renal mass protocol CT and/or MRI is recommended for complete characterization.      12/25/2015 Imaging    Restaging PET Scan 1. Markedly improved skeletal activity, with only several faint foci of residual accentuated metabolic activity, but resolution of the vast majority of the previously extensive osseous metastatic disease. 2. Resolution of the prior right internal mammary and right prevascular lymph nodes. 3. Residual subcutaneous edema and edema along fascia planes in the left upper thigh. This remains somewhat more than I would expect for placement of an IM nail 6 weeks ago. If there is leg swelling further down, consider Doppler venous ultrasound to rule out left lower extremity DVT. I do not perceive an obvious difference in density between the pelvic and common femoral veins on the noncontrast CT data. 4. Chronic right maxillary and right sphenoid sinusitis.      12/2015 -  Chemotherapy    Maintenance Herceptin and pejeta every 3 weeks      01/16/2016 -  Anti-estrogen oral therapy    Letrozole 2.5 mg daily      03/01/2016 Miscellaneous    Patient presented to ED following a fall; she reports she landed on her back and is now experiencing back pain. The patient was evaluated and discharge home same day with pain control.      05/01/2016 Imaging    CT CAP IMPRESSION: 1. Stable exam.  No new or progressive disease identified. 2. No evidence for residual or  recurrent adenopathy within the chest. 3. Stable bone metastasis.      05/01/2016 Imaging    BONE SCAN IMPRESSION: 1. Small focal uptake involving the anterior rib ends of the left second and right fourth ribs, new since the prior bone scan. The location of this uptake is more suggestive of a traumatic or inflammatory etiology as opposed to metastatic disease. No other evidence of metastatic disease. 2. There are other areas of stable uptake which are most likely degenerative/reactive, including right shoulder and cervical spine uptake and uptake along the right femur adjacent to the ORIF hardware.      05/20/2016 Imaging    NM PET Skull to Thigh IMPRESSION: 1. There multiple metastatic foci in the bony pelvis, including some new abnormal foci compare to the prior PET-CT, compatible with mildly progressive bony metastatic disease. Also, a left T11 vertebral hypermetabolic metastatic lesion is observed, new compared to the prior exam. 2. No active extraosseous malignancy is currently identified. 3. Right anterior fourth rib healing fracture, appears be      06/04/2016 -  Chemotherapy    Second line chemotherapy Kadcyla every 3 weeks started on 06/04/16       10/05/2016 Imaging    CT CAP and Bone Scan:  Bones: left intramedullary rod and left femoral head neck screw in place. In the proximal diaphysis of the left femur  there is cortical thickening and irregularity. No lytic or blastic bone lesions. No fracture or vertebral endplate destruction.   No definite evidence of recurrent disease in the chest, abdomen, or pelvis.      10/28/2016 Imaging    CT A/P IMPRESSION: No acute process demonstrated in the abdomen or pelvis. No evidence of bowel obstruction or inflammation.      11/04/2016 Imaging    DG foot complete left: IMPRESSION: No fracture or dislocation.  No soft tissue abnormality      11/23/2016 Imaging    CT RIGHT FEMUR IMPRESSION: Negative CT scan of the  right thigh.  No visible metastatic disease.  CT LEFT FEMUR IMPRESSION: 1. Postsurgical changes related to prior cephalomedullary rod fixation of the left femur with linear lucency along the anterolateral proximal femoral cortex at level of the lesser trochanter, suspicious for nondisplaced fracture. 2. Slightly permeative appearance of the proximal left femoral cortex at the site of known prior osseous metastases is largely unchanged. 3. Unchanged patchy lucency and sclerosis along the anterior acetabulum, which appears to correspond with an area of faint uptake on the prior PET-CT, suspicious for osseous metastasis. These results will be called to the ordering clinician or representative by the Radiologist Assistant, and communication documented in the PACS or zVision Dashboard.        01/27/2017 Imaging    IMPRESSION: 1. Mixed response to osseous metastasis. Some hypermetabolic lesions are new and increasingly hypermetabolic, while another index lesion is decreasingly hypermetabolic. 2. No evidence of hypermetabolic soft tissue metastasis.        HISTORY OF PRESENTING ILLNESS (From Dr. Mariana Kaufman note on 02/06/2016):  [name] 52 y.o. 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 She has had an excellent response to 6 cycles of taxotere herceptin perjeta from 07-30-15 thru 11-29-15. She is continuing herceptin and perjeta every 3 weeks. She had extreme local pain with first xgeva injection on 01-16-16 and prefers resuming previous zometa. She began letrozole ~ 01-16-16, has no complaints about this at all.  Last imaging was PET 12-25-15.  NOTE CTs, plain Xrays and bone scan did NOT show any of the extensively metastatic bone involvement 06-2015; MRI and PET did image disease clearly.  Patient continues to do very well overall. MRSA skin lesions on scalp and face improved  quickly with doxycycline starting 01-16-16. She continues to use Dial soap and we have again strongly encouraged good hygiene with other family members at home. She has had the 3 lymphedema PT sessions allowed by insurance, continues to do massage for LLE herself, still has not gotten the ordered compression apparatus for home and will follow up with PT on that. Swelling in LLE has improved with the interventions and does not seem as uncomfortable. She denies any pain. Appetite is excellent, bowels fine, no SOB, good energy, no problems with PAC, no noted changes in left breast. No bleeding. No fever or symptoms of infection. She slipped in tub last week, fell onto right arm fortunately without injury. She was upset by a visitor and shaky then, but no tremors otherwise.   She has two children and 4 grandchildren. She has had genetic testing and it was negative. She lives at home with her husband. She is very active. Her children live close to her.   CURRENT THERAPY:  Letrozole 2.'5mg'$  daily, second line Kadcyla every 3 weeks started on 06/04/16. Stopped letrozole and Kadcyla on  02/03/17. Switched to 25 mg Exemestane, lapatinib and herceptin every 3 weeks. She can only tolerate 500 mg lapatinib daily.   INTERIM HISTORY:  CHELESA WEINGARTNER returns today for follow up and ongoing treatment. She presents to the clinic today with her interpreter and family member. She reports that she is doing okay overall. She notes that she fell out of the bed yesterday and hit her head and body on the night stand. There is a bump on her head. She states she is sore in the area and the right side of her body. .   She states she took 500 mg Lapatinib for 1 week and did fine but when she increased to 750 mg, she experienced abdominal cramping and diarrhea.   On review of systems, pt denies vomiting, or any other complaints at this time. Pertinent positives are listed and detailed within the above HPI.   MEDICAL HISTORY:  Past  Medical History:  Diagnosis Date  . Abnormal Pap smear   . Anxiety   . Breast cancer ([place])   . Cancer ([place]) 1999   breast-s/p mastectomy, chemo, rad  . CIN I (cervical intraepithelial neoplasia I) 2003   by colpo  . Congenital deafness   . Deaf   . Depression   . Radiation 07/12/15-07/26/15   left femur 30 Gy  . S/P bilateral oophorectomy     SURGICAL HISTORY: Past Surgical History:  Procedure Laterality Date  . ABDOMINAL HYSTERECTOMY    . INTRAMEDULLARY (IM) NAIL INTERTROCHANTERIC Left 06/26/2015   Procedure: LEFT BIOMET LONG AFFIXS NAIL;  Surgeon: [name], MD;  Location: [place] [facility];  Service: Orthopedics;  Laterality: Left;  . IR GENERIC HISTORICAL  08/06/2015   IR US GUIDE VASC ACCESS LEFT 08/06/2015 Aletta Edouard, MD [facility]  . IR GENERIC HISTORICAL  08/06/2015   IR FLUORO GUIDE CV LINE LEFT 08/06/2015 Aletta Edouard, MD [facility]  . IR GENERIC HISTORICAL  03/05/2016   IR CV LINE INJECTION 03/05/2016 Markus Daft, MD [facility]  . MASTECTOMY     right breast  . MASTECTOMY    . OVARIAN CYST REMOVAL    . RADIOLOGY WITH ANESTHESIA Left 06/25/2015   Procedure: MRI OF LEFT HIP WITH OR WITHOUT CONTRAST;  Surgeon: Medication Radiologist, MD;  Location: [facility];  Service: Radiology;  Laterality: Left;  DR. MCINTYRE/MRI  . TUBAL LIGATION      SOCIAL HISTORY: Social History   Socioeconomic History  . Marital status: Married    Spouse name: Not on file  . Number of children: 2  . Years of education: Not on file  . Highest education level: Not on file  Social Needs  . Financial resource strain: Not on file  . Food insecurity - worry: Not on file  . Food insecurity - inability: Not on file  . Transportation needs - medical: Not on file  . Transportation needs - non-medical: Not on file  Occupational History  . Not on file  Tobacco Use  . Smoking status: Never Smoker  . Smokeless tobacco: Never Used  Substance and Sexual Activity  . Alcohol use: No  . Drug use: No  .  Sexual activity: Yes    Birth control/protection: Surgical  Other Topics Concern  . Not on file  Social History Narrative  . Not on file    FAMILY HISTORY: Family History  Problem Relation Age of Onset  . Hypertension Mother   . Seizures Mother   . Alzheimer's disease Maternal Uncle   .  Pancreatitis Maternal Grandmother   . Heart attack Maternal Grandfather     ALLERGIES:  is allergic to trazodone and nefazodone; chlorhexidine; promethazine hcl; and diphenhydramine hcl.  MEDICATIONS:  Current Outpatient Medications  Medication Sig Dispense Refill  . Cholecalciferol 2000 units CAPS Take 1 capsule by mouth daily.    . diazepam (VALIUM) 5 MG tablet TAKE 1 TABLET BY MOUTH AT BEDTIME AS NEEDED FOR ANXIETY 30 tablet 0  . divalproex (DEPAKOTE) 250 MG DR tablet Take 1 tablet (250 mg total) by mouth at bedtime. 30 tablet 2  . exemestane (AROMASIN) 25 MG tablet Take 1 tablet (25 mg total) by mouth daily after breakfast. 30 tablet 3  . HYDROcodone-acetaminophen (NORCO) 5-325 MG tablet Take 1-2 tablets by mouth every 6 (six) hours as needed for moderate pain. 45 tablet 0  . HYDROmorphone (DILAUDID) 2 MG tablet Take 1 tablet (2 mg total) by mouth every 4 (four) hours as needed for severe pain. 30 tablet 0  . lapatinib (TYKERB) 250 MG tablet Take 6 tablets (1,500 mg total) by mouth daily. Take on an empty stomach, 1 hour before or 2 hours after meals. (Patient taking differently: Take 1,500 mg by mouth daily. Take on an empty stomach, 1 hour before or 2 hours after meals.Taking 2 tabs daily) 180 tablet 2  . lidocaine-prilocaine (EMLA) cream Apply 1 application as needed topically. Apply to Porta-Cath 1-2 hours prior to access as directed. 30 g 2  . potassium chloride (K-DUR) 10 MEQ tablet Take 2 tablets (20 mEq total) by mouth daily. 60 tablet 2  . traZODone (DESYREL) 50 MG tablet Take 1 tablet (50 mg total) at bedtime as needed by mouth for sleep. 30 tablet 3  . prochlorperazine (COMPAZINE) 10 MG  tablet Take 1 tablet (10 mg total) by mouth every 6 (six) hours as needed for nausea or vomiting. (Patient not taking: Reported on 01/29/2017) 30 tablet 2   No current facility-administered medications for this visit.    Facility-Administered Medications Ordered in Other Visits  Medication Dose Route Frequency Provider Last Rate Last Dose  . sodium chloride 0.9 % 1,000 mL with potassium chloride 10 mEq infusion   Intravenous Continuous Gordy Levan, MD   Stopped at 09/10/15 1653  . sodium chloride 0.9 % injection 10 mL  10 mL Intravenous PRN Livesay, Lennis P, MD        REVIEW OF SYSTEMS:  Constitutional: Denies fevers, chills or abnormal night sweats  Eyes: Denies blurriness of vision, double vision or watery eyes Ears, nose, mouth, throat, and face: Denies mucositis or sore throat (+) deaf Respiratory: Denies cough, dyspnea or wheezes Cardiovascular: Denies palpitation, chest discomfort or lower extremity swelling Gastrointestinal:  Denies nausea, heartburn or change in bowel habits Lymphatics: Denies new lymphadenopathy or easy bruising. MSK: (+) Left leg pain  Behavioral/Psych: (+)mild anxiety All other systems were reviewed with the patient and are negative.  PHYSICAL EXAMINATION: ECOG PERFORMANCE STATUS: 1 BP 134/73 (BP Location: Left Arm, Patient Position: Sitting)   Pulse 64   Temp 97.6 F (36.4 C) (Oral)   Resp 18   Ht '5\' 7"'$  (1.702 m)   Wt 125 lb 4.8 oz (56.8 kg)   SpO2 100%   BMI 19.62 kg/m   GENERAL:alert, no distress and comfortable  EYES: normal, conjunctiva are pink and non-injected, sclera clear OROPHARYNX:no exudate, no erythema and lips, buccal mucosa, and tongue normal  NECK: supple, thyroid normal size, non-tender, without nodularity LYMPH:  no palpable lymphadenopathy in the cervical, axillary or  inguinal LUNGS: clear to auscultation and percussion with normal breathing effort HEART: regular rate & rhythm and no murmurs and no lower extremity  edema ABDOMEN:abdomen soft, non-tender and normal bowel sounds Musculoskeletal:no cyanosis of digits and no clubbing  PSYCH: alert & oriented x 3 with fluent speech NEURO: no focal motor/sensory deficits Breasts: Breast inspection showed Right breast is surgically absent status post reconstruction. Palpation of the breasts and axilla revealed no obvious mass that I could appreciate.   LABORATORY DATA:  I have reviewed the data as listed CBC Latest Ref Rng & Units 03/22/2017 03/01/2017 02/01/2017  WBC 3.9 - 10.3 K/uL 4.7 3.9 4.7  Hemoglobin 11.6 - 15.9 g/dL 12.2 11.3(L) 12.3  Hematocrit 34.8 - 46.6 % 35.7 34.1(L) 36.9  Platelets 145 - 400 K/uL 178 122(L) 123(L)   CMP Latest Ref Rng & Units 03/22/2017 03/01/2017 02/01/2017  Glucose 70 - 140 mg/dL 81 80 85  BUN 7 - 26 mg/dL 7 5(L) 9  Creatinine 0.60 - 1.10 mg/dL 0.80 0.83 0.79  Sodium 136 - 145 mmol/L 142 143 140  Potassium 3.5 - 5.1 mmol/L 3.6 3.6 3.8  Chloride 98 - 109 mmol/L 108 107 104  CO2 22 - 29 mmol/L '27 27 29  '$ Calcium 8.4 - 10.4 mg/dL 9.4 9.2 9.4  Total Protein 6.4 - 8.3 g/dL 7.2 6.4 7.4  Total Bilirubin 0.2 - 1.2 mg/dL 1.7(H) 0.8 1.1  Alkaline Phos 40 - 150 U/L 82 65 65  AST 5 - 34 U/L '18 21 29  '$ ALT 0 - 55 U/L '16 13 26   '$ PROCEDURES:  ECHO 12/17/16  Study Conclusions - Left ventricle: Inferobasal hypokinesis. The cavity size was   normal. Systolic function was normal. The estimated ejection   fraction was in the range of 50% to 55%. Wall motion was normal;   there were no regional wall motion abnormalities. Left   ventricular diastolic function parameters were normal. - Atrial septum: No defect or patent foramen ovale was identified. - Impressions: Normal GLS -17.7. Impressions: - Normal GLS -17.7.   Echocardiogram 05/21/2016 Study Conclusions  - Left ventricle: GLS is normal at -20.6% The cavity size was   normal. There was mild focal basal hypertrophy of the septum.   Systolic function was normal. The estimated  ejection fraction was   55%. There is akinesis of the basalinferior myocardium. - Aortic valve: Trileaflet; normal thickness, mildly calcified   leaflets. There was trivial regurgitation. - Mitral valve: There was trivial regurgitation. - Tricuspid valve: There was mild regurgitation. - Pulmonic valve: There was trivial regurgitation. - Pulmonary arteries: PA peak pressure: 37 mm Hg (S). Impressions: - The right ventricular systolic pressure was increased consistent   with mild pulmonary hypertension.   RADIOGRAPHIC STUDIES:  PET SCAN: 01/27/17 IMPRESSION: 1. Mixed response to osseous metastasis. Some hypermetabolic lesions are new and increasingly hypermetabolic, while another index lesion is decreasingly hypermetabolic. 2. No evidence of hypermetabolic soft tissue metastasis.  CT A/P IMPRESSION: 10/28/16 No acute process demonstrated in the abdomen or pelvis. No evidence of bowel obstruction or inflammation.  CT CAP and Bone Scan: 10/05/16  Bones: left intramedullary rod and left femoral head neck screw in place. In the proximal diaphysis of the left femur there is cortical thickening and irregularity. No lytic or blastic bone lesions. No fracture or vertebral endplate destruction.  No definite evidence of recurrent disease in the chest, abdomen, or pelvis.   PET 05/20/2016 IMPRESSION: 1. There multiple metastatic foci in the bony pelvis, including some new  abnormal foci compare to the prior PET-CT, compatible with mildly progressive bony metastatic disease. Also, a left T11 vertebral hypermetabolic metastatic lesion is observed, new compared to the prior exam. 2. No active extraosseous malignancy is currently identified. 3. Right anterior fourth rib healing fracture, appears benign.  NM Bone Scan 05/01/2016 IMPRESSION: 1. Small focal uptake involving the anterior rib ends of the left second and right fourth ribs, new since the prior bone scan. The location of this uptake  is more suggestive of a traumatic or inflammatory etiology as opposed to metastatic disease. No other evidence of metastatic disease. 2. There are other areas of stable uptake which are most likely degenerative/reactive, including right shoulder and cervical spine uptake and uptake along the right femur adjacent to the ORIF Hardware.  CT CAP 05/01/2016 IMPRESSION: 1. Stable exam.  No new or progressive disease identified. 2. No evidence for residual or recurrent adenopathy within the chest. 3. Stable bone metastasis.   I have personally reviewed the radiological images as listed and agreed with the findings in the report. No results found.   ASSESSMENT & PLAN:  Mrs. Obrien is a 52 y.o. postmenopause female with:  1. 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 -Restaging PET 12-25-15 markedly improved of bone metastasis after initial systemic chemo  -she was switched to letrozole and maintenance Herceptin and the perjeta on 01-16-16.  -Did not tolerate SQ xgeva and it was changed to zometa  -Echocardiogram 01-27-16 showed normal EF, will continue monitoring every 3 months when she is on herceptin -I reviewed her CT scan and bone scan from 05/01/2016, which showed stable disease overall, a few new uptake of anterior ribs on bone scan are indeterminate. -She has developed worsening left hip pain, and more fatigued lately. I have clinical concern of disease progression. -I previously  discussed her restaging PET scan from 05/20/2016. 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 She declined other chemo.  -Labs reviewed, her bilirubin is slightly elevated but stable, adequate to continue treatment. Her tumor marker CA27.29   has been trending down.  -She moved to [facility] for 3 months, back to [place] now  -We received records from [facility] in [place], [place]. She underwent CT CAP w contrast on 10/02/16 and bone scan on 09/21/16. CT shows no evidence of recurrent disease in the chest, abdomen, or pelvis; Bone scan indicates no lytic or blastic bone lesions.  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  No other visceral organ metastasis. She is clinically doing well.  - I discussed that her mixed response is likely indicating her tumor is developing resistance to her Kaycyla. We discussed Treatment options.  Due to her bone metastasis only disease, she is not symptomatic, and previous poor tolerance to chemotherapy, I will switch her antiestrogen therapy and HER-2 antibody alone, and preserve chemotherapy for future.  We will stop letrozole and Kadcyla and switch to Exemestane, oral HER2 antibody treatment lapatinib and herceptin.  I discussed side effects of lapatinib including diarrhea and skin rash that she can use imodium and hydrocortisone.  She voiced good understanding, and agrees to proceed. -We again discussed the goal of therapy is palliative, to control her disease, and to prolong her life.  Her cancer is incurable at this stage.  She voiced understanding. -She had severe side effects of diarrhea, depression and low appetite with lapatinib on full dose of 1500 mg after 1 week and she stopped. I recommend her to restarted with low dose  -She can only tolerate low dose 500 mg of lapatinib, she will continue with this dose until next PET scan. I will order at next visit -I advised her to begin Ca  supplement.  -Continue with herceptin today -Labs today (03/22/17) her CBC is WNL, will proceed Herceptin therapy today. - F/u in 3 weeks   2. History of MRSA skin infection scalp, boil at low back scalp and upper lip -I prescribed doxycycline on 04/24/16 for 7 days  - Resolved now.  3.congenital deafness -needs sign language interpreter for all visits.  -Husband also deaf.  4. premature surgical menopause -post BSO 2055fr pelvic pain and ovarian cysts, likely was also beneficial from standpoint of the breast cancer. -hysterectomy.   5. Intermittent anemia  -mild, possible related to prior chemo -She is asymptomatic, we'll continue close monitoring  6. Hypokalemia  -K 3.1 on 03/05/16, she is on KCL 260m daily, increased to 4013mdaily for a week. -K 3.2 on 04/03/16. The patient is on KCl 54m40maily, increased to 40me56mily for a week and then take 1 pill a day as normal. -K 3.3 on 07/16/16, continue KCL 54meq5mly  -received IV fluids in 11/25/16  -She does not tolerate liquid potassium so she will continue with tablet form. She will conitnues 20 mEq daily  -She has not been taking potassium due to the size. Her Potassium is 3.3 (01/08/17). I recommended she can crush the potassium pill and mix it in a smoothie or dink.  -potassium is 3.6  today 03/22/17, refilled supplement today.   7. Goal of care discussion  -We previously discussed the incurable nature of her cancer, and the overall poor prognosis, especially if she does not have good response to chemotherapy or progress on chemo.  -The patient understands the goal of care is palliative. -she is full code for now   8. Left Leg Pain -she initially presented with worsening left leg and hip pain secondary to bone mets, she had surgery before, this was much improved after her prior chemo - The patient previously took Hydrocodone for this pain, which helped. - I refilled Hydrocodone previously on 5/3. She will take 1 tablet every  6 hours as needed for pain. -Pain has recurred. Recent PET scan showed minimum uptake, unlikely related to bone mets progression  - I encouraged her to call Dr. Yates [name] the pain in her left leg. She has followed-up  With him.  -She requests to have bed for all infusions  -She is clinically doing well, left hip pain is well controlled, mild and intermittent overall.  9. Insomnia -I give her Ambien '5mg'$ , but it did not work well.  -continue ativan 1 mg to take at night as needed  -mirtazapine did not work, she also tried trazodone from PCP which gave her a rash. She stopped taking it.   10. Fa56  -she has a fall at home last week, with mild on her legs, some pain in  the right side of head and ear -Exam was otherwise unremarkable -I advise her to be cautions of fall    Plan:   -continue lapatinib at reduced dose 500 mg daily -continue Exemestane daily -will proceed Herceptin today and continue every 3 weeks -Zometa today and every 3 months  -start Ca supplement  -Lab, flush and f/u in 3 weeks with herceptin, will order restaging PET on next visit    Pt had many questions. All questions were answered. The patient knows to call the clinic with any problems, questions or concerns.  I spent 20 minutes counseling the patient face to face. The total time spent in the appointment was 25 minutes and more than 50% was on counseling.  This document serves as a record of services personally performed by Truitt Merle, MD. It was created on her behalf by Theresia Bough, a trained medical scribe. The creation of this record is based on the scribe's personal observations and the provider's statements to them.   I have reviewed the above documentation for accuracy and completeness, and I agree with the above.    Truitt Merle, MD 03/22/2017

## 2017-03-22 ENCOUNTER — Inpatient Hospital Stay: Payer: Medicaid Other

## 2017-03-22 ENCOUNTER — Inpatient Hospital Stay (HOSPITAL_BASED_OUTPATIENT_CLINIC_OR_DEPARTMENT_OTHER): Payer: Medicaid Other | Admitting: Hematology

## 2017-03-22 ENCOUNTER — Inpatient Hospital Stay: Payer: Medicaid Other | Attending: Hematology

## 2017-03-22 ENCOUNTER — Encounter: Payer: Self-pay | Admitting: Hematology

## 2017-03-22 ENCOUNTER — Other Ambulatory Visit: Payer: Medicaid Other

## 2017-03-22 VITALS — BP 134/73 | HR 64 | Temp 97.6°F | Resp 18 | Ht 67.0 in | Wt 125.3 lb

## 2017-03-22 DIAGNOSIS — Z79811 Long term (current) use of aromatase inhibitors: Secondary | ICD-10-CM

## 2017-03-22 DIAGNOSIS — C50911 Malignant neoplasm of unspecified site of right female breast: Secondary | ICD-10-CM

## 2017-03-22 DIAGNOSIS — D649 Anemia, unspecified: Secondary | ICD-10-CM | POA: Insufficient documentation

## 2017-03-22 DIAGNOSIS — G4700 Insomnia, unspecified: Secondary | ICD-10-CM | POA: Diagnosis not present

## 2017-03-22 DIAGNOSIS — E876 Hypokalemia: Secondary | ICD-10-CM | POA: Diagnosis not present

## 2017-03-22 DIAGNOSIS — Z5112 Encounter for antineoplastic immunotherapy: Secondary | ICD-10-CM | POA: Insufficient documentation

## 2017-03-22 DIAGNOSIS — C7951 Secondary malignant neoplasm of bone: Secondary | ICD-10-CM | POA: Diagnosis not present

## 2017-03-22 DIAGNOSIS — C799 Secondary malignant neoplasm of unspecified site: Principal | ICD-10-CM

## 2017-03-22 DIAGNOSIS — H905 Unspecified sensorineural hearing loss: Secondary | ICD-10-CM

## 2017-03-22 DIAGNOSIS — Z170 Estrogen receptor positive status [ER+]: Secondary | ICD-10-CM

## 2017-03-22 DIAGNOSIS — Z8614 Personal history of Methicillin resistant Staphylococcus aureus infection: Secondary | ICD-10-CM | POA: Insufficient documentation

## 2017-03-22 DIAGNOSIS — Z9181 History of falling: Secondary | ICD-10-CM | POA: Diagnosis not present

## 2017-03-22 DIAGNOSIS — C50919 Malignant neoplasm of unspecified site of unspecified female breast: Secondary | ICD-10-CM

## 2017-03-22 DIAGNOSIS — Z95828 Presence of other vascular implants and grafts: Secondary | ICD-10-CM

## 2017-03-22 DIAGNOSIS — R42 Dizziness and giddiness: Secondary | ICD-10-CM

## 2017-03-22 LAB — COMPREHENSIVE METABOLIC PANEL
ALK PHOS: 82 U/L (ref 40–150)
ALT: 16 U/L (ref 0–55)
AST: 18 U/L (ref 5–34)
Albumin: 3.7 g/dL (ref 3.5–5.0)
Anion gap: 7 (ref 3–11)
BILIRUBIN TOTAL: 1.7 mg/dL — AB (ref 0.2–1.2)
BUN: 7 mg/dL (ref 7–26)
CALCIUM: 9.4 mg/dL (ref 8.4–10.4)
CO2: 27 mmol/L (ref 22–29)
CREATININE: 0.8 mg/dL (ref 0.60–1.10)
Chloride: 108 mmol/L (ref 98–109)
Glucose, Bld: 81 mg/dL (ref 70–140)
Potassium: 3.6 mmol/L (ref 3.5–5.1)
Sodium: 142 mmol/L (ref 136–145)
TOTAL PROTEIN: 7.2 g/dL (ref 6.4–8.3)

## 2017-03-22 LAB — CBC WITH DIFFERENTIAL/PLATELET
BASOS ABS: 0 10*3/uL (ref 0.0–0.1)
Basophils Relative: 1 %
EOS PCT: 3 %
Eosinophils Absolute: 0.1 10*3/uL (ref 0.0–0.5)
HEMATOCRIT: 35.7 % (ref 34.8–46.6)
Hemoglobin: 12.2 g/dL (ref 11.6–15.9)
Lymphocytes Relative: 33 %
Lymphs Abs: 1.5 10*3/uL (ref 0.9–3.3)
MCH: 29.5 pg (ref 25.1–34.0)
MCHC: 34.3 g/dL (ref 31.5–36.0)
MCV: 86.2 fL (ref 79.5–101.0)
Monocytes Absolute: 0.3 10*3/uL (ref 0.1–0.9)
Monocytes Relative: 7 %
NEUTROS PCT: 56 %
Neutro Abs: 2.6 10*3/uL (ref 1.5–6.5)
PLATELETS: 178 10*3/uL (ref 145–400)
RBC: 4.14 MIL/uL (ref 3.70–5.45)
RDW: 13.7 % (ref 11.2–14.5)
WBC: 4.7 10*3/uL (ref 3.9–10.3)

## 2017-03-22 MED ORDER — DIVALPROEX SODIUM 250 MG PO DR TAB: 250.0000 mg | DELAYED_RELEASE_TABLET | Freq: Every day | ORAL | 2 refills | Status: DC

## 2017-03-22 MED ORDER — ACETAMINOPHEN 325 MG PO TABS
ORAL_TABLET | ORAL | Status: AC
  Filled 2017-03-22: qty 2

## 2017-03-22 MED ORDER — ACETAMINOPHEN 325 MG PO TABS
650.0000 mg | ORAL_TABLET | Freq: Once | ORAL | Status: AC
  Administered 2017-03-22: 650 mg via ORAL

## 2017-03-22 MED ORDER — ZOLEDRONIC ACID 4 MG/100ML IV SOLN
4.0000 mg | Freq: Once | INTRAVENOUS | Status: AC
  Administered 2017-03-22: 4 mg via INTRAVENOUS
  Filled 2017-03-22: qty 100

## 2017-03-22 MED ORDER — SODIUM CHLORIDE 0.9 % IJ SOLN
10.0000 mL | INTRAMUSCULAR | Status: DC | PRN
  Administered 2017-03-22: 10 mL via INTRAVENOUS
  Filled 2017-03-22: qty 10

## 2017-03-22 MED ORDER — HEPARIN SOD (PORK) LOCK FLUSH 100 UNIT/ML IV SOLN
500.0000 [IU] | Freq: Once | INTRAVENOUS | Status: AC | PRN
  Administered 2017-03-22: 500 [IU]
  Filled 2017-03-22: qty 5

## 2017-03-22 MED ORDER — LORATADINE 10 MG PO TABS
10.0000 mg | ORAL_TABLET | Freq: Every day | ORAL | Status: DC
  Administered 2017-03-22: 10 mg via ORAL
  Filled 2017-03-22: qty 1

## 2017-03-22 MED ORDER — POTASSIUM CHLORIDE ER 10 MEQ PO TBCR: 20.0000 meq | EXTENDED_RELEASE_TABLET | Freq: Every day | ORAL | 2 refills | Status: DC

## 2017-03-22 MED ORDER — SODIUM CHLORIDE 0.9% FLUSH
10.0000 mL | INTRAVENOUS | Status: DC | PRN
  Administered 2017-03-22: 10 mL
  Filled 2017-03-22: qty 10

## 2017-03-22 MED ORDER — TRASTUZUMAB CHEMO 150 MG IV SOLR
6.0000 mg/kg | Freq: Once | INTRAVENOUS | Status: AC
  Administered 2017-03-22: 357 mg via INTRAVENOUS
  Filled 2017-03-22: qty 17

## 2017-03-22 NOTE — Progress Notes (Signed)
Dr. Burr Medico made aware of need for ECHO. Last ECHO was 12/17/2016. Dr. Burr Medico stated she would get another ECHO ordered. Patient made aware via interpreter.

## 2017-03-22 NOTE — Patient Instructions (Signed)
[facility] Discharge Instructions for Patients Receiving Chemotherapy  Today you received the following chemotherapy agents Herceptin.   To help prevent nausea and vomiting after your treatment, we encourage you to take your nausea medication as directed.   If you develop nausea and vomiting that is not controlled by your nausea medication, call the clinic.   BELOW ARE SYMPTOMS THAT SHOULD BE REPORTED IMMEDIATELY:  *FEVER GREATER THAN 100.5 F  *CHILLS WITH OR WITHOUT FEVER  NAUSEA AND VOMITING THAT IS NOT CONTROLLED WITH YOUR NAUSEA MEDICATION  *UNUSUAL SHORTNESS OF BREATH  *UNUSUAL BRUISING OR BLEEDING  TENDERNESS IN MOUTH AND THROAT WITH OR WITHOUT PRESENCE OF ULCERS  *URINARY PROBLEMS  *BOWEL PROBLEMS  UNUSUAL RASH Items with * indicate a potential emergency and should be followed up as soon as possible.  Feel free to call the clinic should you have any questions or concerns. The clinic phone number is [phone].  Please show the [facility] at check-in to the Emergency Department and triage nurse.  Zoledronic Acid injection (Hypercalcemia, Oncology) What is this medicine? ZOLEDRONIC ACID (ZOE le dron ik AS id) lowers the amount of calcium loss from bone. It is used to treat too much calcium in your blood from cancer. It is also used to prevent complications of cancer that has spread to the bone. This medicine may be used for other purposes; ask your health care provider or pharmacist if you have questions. COMMON BRAND NAME(S): Zometa What should I tell my health care provider before I take this medicine? They need to know if you have any of these conditions: -aspirin-sensitive asthma -cancer, especially if you are receiving medicines used to treat cancer -dental disease or wear dentures -infection -kidney disease -receiving corticosteroids like dexamethasone or prednisone -an unusual or allergic reaction to zoledronic acid, other medicines,  foods, dyes, or preservatives -pregnant or trying to get pregnant -breast-feeding How should I use this medicine? This medicine is for infusion into a vein. It is given by a health care professional in a hospital or clinic setting. Talk to your pediatrician regarding the use of this medicine in children. Special care may be needed. Overdosage: If you think you have taken too much of this medicine contact a poison control center or emergency room at once. NOTE: This medicine is only for you. Do not share this medicine with others. What if I miss a dose? It is important not to miss your dose. Call your doctor or health care professional if you are unable to keep an appointment. What may interact with this medicine? -certain antibiotics given by injection -NSAIDs, medicines for pain and inflammation, like ibuprofen or naproxen -some diuretics like bumetanide, furosemide -teriparatide -thalidomide This list may not describe all possible interactions. Give your health care provider a list of all the medicines, herbs, non-prescription drugs, or dietary supplements you use. Also tell them if you smoke, drink alcohol, or use illegal drugs. Some items may interact with your medicine. What should I watch for while using this medicine? Visit your doctor or health care professional for regular checkups. It may be some time before you see the benefit from this medicine. Do not stop taking your medicine unless your doctor tells you to. Your doctor may order blood tests or other tests to see how you are doing. Women should inform their doctor if they wish to become pregnant or think they might be pregnant. There is a potential for serious side effects to an unborn child. Talk  to your health care professional or pharmacist for more information. You should make sure that you get enough calcium and vitamin D while you are taking this medicine. Discuss the foods you eat and the vitamins you take with your health  care professional. Some people who take this medicine have severe bone, joint, and/or muscle pain. This medicine may also increase your risk for jaw problems or a broken thigh bone. Tell your doctor right away if you have severe pain in your jaw, bones, joints, or muscles. Tell your doctor if you have any pain that does not go away or that gets worse. Tell your dentist and dental surgeon that you are taking this medicine. You should not have major dental surgery while on this medicine. See your dentist to have a dental exam and fix any dental problems before starting this medicine. Take good care of your teeth while on this medicine. Make sure you see your dentist for regular follow-up appointments. What side effects may I notice from receiving this medicine? Side effects that you should report to your doctor or health care professional as soon as possible: -allergic reactions like skin rash, itching or hives, swelling of the face, lips, or tongue -anxiety, confusion, or depression -breathing problems -changes in vision -eye pain -feeling faint or lightheaded, falls -jaw pain, especially after dental work -mouth sores -muscle cramps, stiffness, or weakness -redness, blistering, peeling or loosening of the skin, including inside the mouth -trouble passing urine or change in the amount of urine Side effects that usually do not require medical attention (report to your doctor or health care professional if they continue or are bothersome): -bone, joint, or muscle pain -constipation -diarrhea -fever -hair loss -irritation at site where injected -loss of appetite -nausea, vomiting -stomach upset -trouble sleeping -trouble swallowing -weak or tired This list may not describe all possible side effects. Call your doctor for medical advice about side effects. You may report side effects to FDA at 1-800-FDA-1088. Where should I keep my medicine? This drug is given in a hospital or clinic and  will not be stored at home. NOTE: This sheet is a summary. It may not cover all possible information. If you have questions about this medicine, talk to your doctor, pharmacist, or health care provider.  2018 [employer] (2013-05-27 14:19:39)

## 2017-03-23 ENCOUNTER — Other Ambulatory Visit: Payer: Self-pay

## 2017-03-23 LAB — CANCER ANTIGEN 27.29: CAN 27.29: 36.5 U/mL (ref 0.0–38.6)

## 2017-03-23 NOTE — Telephone Encounter (Signed)
Pt called requesting refills for hydromorphone, trazodone and diazepam. Pt's call back [place], RN'

## 2017-03-24 ENCOUNTER — Other Ambulatory Visit: Payer: Self-pay | Admitting: Internal Medicine

## 2017-03-24 MED ORDER — TRAZODONE HCL 50 MG PO TABS: 50.0000 mg | ORAL_TABLET | Freq: Every evening | ORAL | 3 refills | Status: DC | PRN

## 2017-03-24 MED ORDER — HYDROMORPHONE HCL 2 MG PO TABS: 2.0000 mg | ORAL_TABLET | ORAL | 0 refills | Status: DC | PRN

## 2017-04-05 ENCOUNTER — Other Ambulatory Visit: Payer: Self-pay

## 2017-04-05 DIAGNOSIS — G4700 Insomnia, unspecified: Secondary | ICD-10-CM

## 2017-04-05 NOTE — Telephone Encounter (Signed)
Pt requesting refill of diazepam [facility] Pt call back [phone] Wallace Cullens, RN

## 2017-04-06 ENCOUNTER — Other Ambulatory Visit: Payer: Self-pay | Admitting: Internal Medicine

## 2017-04-06 DIAGNOSIS — G4700 Insomnia, unspecified: Secondary | ICD-10-CM

## 2017-04-06 MED ORDER — DIAZEPAM 5 MG PO TABS: ORAL_TABLET | ORAL | 0 refills | Status: DC

## 2017-04-09 NOTE — Progress Notes (Signed)
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 She had mastectomy with the 7 axillary node evaluation, 4 cycles of adriamycin/ cytoxan followed by taxotere, then five years of tamoxifen thru June 2005 (no herceptin in 1999). She had bilateral oophorectomy in May 2005. She was briefly on aromatase inhibitor after tamoxifen, but discontinued this herself due to poor tolerance.      04/04/2015 Genetic Testing    Negative genetic testing on the breast/ovarian cancer pnael.  The Breast/Ovarian gene panel offered by [facility] includes sequencing and rearrangement analysis for the following 20 genes:  ATM, BARD1, BRCA1, BRCA2, BRIP1, CDH1, CHEK2, EPCAM, FANCC, MLH1, MSH2, MSH6, NBN, PALB2, PMS2, PTEN, RAD51C, RAD51D, TP53, and XRCC2.      06/26/2015 Pathology Results    Initial Path Report Bone, curettage, Left femur met, intramedullary subtroch tissue - METASTATIC ADENOCARCINOMA.  Estrogen Receptor: 95%, POSITIVE, STRONG STAINING INTENSITY Progesterone Receptor: 2%, POSITIVE, STRONG  HER2 (+), IHC 3+      06/26/2015 Surgery    Biopsy of metastatic tissue and stabilization with Affixus trochanteric nail, proximal and distal interlock of left femur by Dr. [name]       06/28/2015 Imaging    CT Chest w/ Contrast 1. Extensive right internal mammary chain lymphadenopathy consistent with metastatic breast cancer. 2. Lytic metastases involving the posterior elements at  T1 with probable nondisplaced pathologic fracture of the spinous process. 3. No other evidence of thoracic metastatic disease. 4. Trace bilateral pleural effusions with associated bibasilar atelectasis. 5. Indeterminate right thyroid nodule, not previously imaged. This could be evaluated with thyroid ultrasound as clinically warranted.      07/01/2015 Imaging    Bone Scan 1. Increased activity noted throughout the left femur. Although metastatic disease cannot be excluded, these changes are most likely secondary to prior surgery. 2. No other focal abnormalities identified to suggest metastatic disease.      07/12/2015 - 07/26/2015 Radiation Therapy    Left femur, 30 Gy in 10 fractions by Dr. Sondra Come       07/14/2015 Initial Diagnosis    [name], right ([place])      07/19/2015 Imaging    Initial PET Scan 1. Severe multifocal osseous metastatic disease, much greater than anticipated based on prior imaging. 2. Multifocal nodal metastases involving the right internal mammary, prevascular and subpectoral lymph nodes. No axillary pulmonary involvement identified. 3. No distant extra osseous metastases.      07/30/2015 - 11/29/2015 Chemotherapy    Docetaxel, Herceptin and Perjeta every 3 weeks X6 cycles, pt tolerated moderately well, restaging scan showed excellent response       09/18/2015 Imaging    CT Head w/wo Contrast 1. No acute intracranial abnormality or significant interval change. 2. Stable minimal periventricular white matter hypoattenuation on the right. This may reflect a remote ischemic injury. 3. No evidence for metastatic disease to the brain. 4. No focal soft tissue lesion to explain the patient's tenderness.  10/30/2015 Imaging    CT Abdomen Pelvis w/ Contrast 1. Few mildly prominent fluid-filled loops of small bowel scattered throughout the abdomen, with intraluminal fluid density within the distal colon. Given the provided history, findings  are suggestive of acute enteritis/diarrheal illness. 2. No other acute intra-abdominal or pelvic process identified. 3. Widespread osseous metastatic disease, better evaluated on most recent PET-CT from 07/19/2015. No pathologic fracture or other complication. 4. 11 mm hypodensity within the left kidney. This lesion measures intermediate density, and is indeterminate. While this lesion is similar in size relative to recent studies, this is increased in size relative to prior study from 2012. Further evaluation with dedicated renal mass protocol CT and/or MRI is recommended for complete characterization.      12/25/2015 Imaging    Restaging PET Scan 1. Markedly improved skeletal activity, with only several faint foci of residual accentuated metabolic activity, but resolution of the vast majority of the previously extensive osseous metastatic disease. 2. Resolution of the prior right internal mammary and right prevascular lymph nodes. 3. Residual subcutaneous edema and edema along fascia planes in the left upper thigh. This remains somewhat more than I would expect for placement of an IM nail 6 weeks ago. If there is leg swelling further down, consider Doppler venous ultrasound to rule out left lower extremity DVT. I do not perceive an obvious difference in density between the pelvic and common femoral veins on the noncontrast CT data. 4. Chronic right maxillary and right sphenoid sinusitis.      12/2015 -  Chemotherapy    Maintenance Herceptin and pejeta every 3 weeks      01/16/2016 -  Anti-estrogen oral therapy    Letrozole 2.5 mg daily      03/01/2016 Miscellaneous    Patient presented to ED following a fall; she reports she landed on her back and is now experiencing back pain. The patient was evaluated and discharge home same day with pain control.      05/01/2016 Imaging    CT CAP IMPRESSION: 1. Stable exam.  No new or progressive disease identified. 2. No evidence for residual or  recurrent adenopathy within the chest. 3. Stable bone metastasis.      05/01/2016 Imaging    BONE SCAN IMPRESSION: 1. Small focal uptake involving the anterior rib ends of the left second and right fourth ribs, new since the prior bone scan. The location of this uptake is more suggestive of a traumatic or inflammatory etiology as opposed to metastatic disease. No other evidence of metastatic disease. 2. There are other areas of stable uptake which are most likely degenerative/reactive, including right shoulder and cervical spine uptake and uptake along the right femur adjacent to the ORIF hardware.      05/20/2016 Imaging    NM PET Skull to Thigh IMPRESSION: 1. There multiple metastatic foci in the bony pelvis, including some new abnormal foci compare to the prior PET-CT, compatible with mildly progressive bony metastatic disease. Also, a left T11 vertebral hypermetabolic metastatic lesion is observed, new compared to the prior exam. 2. No active extraosseous malignancy is currently identified. 3. Right anterior fourth rib healing fracture, appears be      06/04/2016 -  Chemotherapy    Second line chemotherapy Kadcyla every 3 weeks started on 06/04/16       10/05/2016 Imaging    CT CAP and Bone Scan:  Bones: left intramedullary rod and left femoral head neck screw in place. In the proximal diaphysis of the left femur  there is cortical thickening and irregularity. No lytic or blastic bone lesions. No fracture or vertebral endplate destruction.   No definite evidence of recurrent disease in the chest, abdomen, or pelvis.      10/28/2016 Imaging    CT A/P IMPRESSION: No acute process demonstrated in the abdomen or pelvis. No evidence of bowel obstruction or inflammation.      11/04/2016 Imaging    DG foot complete left: IMPRESSION: No fracture or dislocation.  No soft tissue abnormality      11/23/2016 Imaging    CT RIGHT FEMUR IMPRESSION: Negative CT scan of the  right thigh.  No visible metastatic disease.  CT LEFT FEMUR IMPRESSION: 1. Postsurgical changes related to prior cephalomedullary rod fixation of the left femur with linear lucency along the anterolateral proximal femoral cortex at level of the lesser trochanter, suspicious for nondisplaced fracture. 2. Slightly permeative appearance of the proximal left femoral cortex at the site of known prior osseous metastases is largely unchanged. 3. Unchanged patchy lucency and sclerosis along the anterior acetabulum, which appears to correspond with an area of faint uptake on the prior PET-CT, suspicious for osseous metastasis. These results will be called to the ordering clinician or representative by the Radiologist Assistant, and communication documented in the PACS or zVision Dashboard.        01/27/2017 Imaging    IMPRESSION: 1. Mixed response to osseous metastasis. Some hypermetabolic lesions are new and increasingly hypermetabolic, while another index lesion is decreasingly hypermetabolic. 2. No evidence of hypermetabolic soft tissue metastasis.        HISTORY OF PRESENTING ILLNESS (From Dr. Mariana Kaufman note on 02/06/2016):  [name] 52 y.o. 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 She has had an excellent response to 6 cycles of taxotere herceptin perjeta from 07-30-15 thru 11-29-15. She is continuing herceptin and perjeta every 3 weeks. She had extreme local pain with first xgeva injection on 01-16-16 and prefers resuming previous zometa. She began letrozole ~ 01-16-16, has no complaints about this at all.  Last imaging was PET 12-25-15.  NOTE CTs, plain Xrays and bone scan did NOT show any of the extensively metastatic bone involvement 06-2015; MRI and PET did image disease clearly.  Patient continues to do very well overall. MRSA skin lesions on scalp and face improved  quickly with doxycycline starting 01-16-16. She continues to use Dial soap and we have again strongly encouraged good hygiene with other family members at home. She has had the 3 lymphedema PT sessions allowed by insurance, continues to do massage for LLE herself, still has not gotten the ordered compression apparatus for home and will follow up with PT on that. Swelling in LLE has improved with the interventions and does not seem as uncomfortable. She denies any pain. Appetite is excellent, bowels fine, no SOB, good energy, no problems with PAC, no noted changes in left breast. No bleeding. No fever or symptoms of infection. She slipped in tub last week, fell onto right arm fortunately without injury. She was upset by a visitor and shaky then, but no tremors otherwise.   She has two children and 4 grandchildren. She has had genetic testing and it was negative. She lives at home with her husband. She is very active. Her children live close to her.   CURRENT THERAPY:  Letrozole 2.'5mg'$  daily, second line Kadcyla every 3 weeks started on 06/04/16. Stopped letrozole and Kadcyla on  02/03/17. Switched to 25 mg Exemestane, lapatinib and herceptin every 3 weeks. She can only tolerate 500 mg lapatinib daily.   INTERIM HISTORY:  [name] returns today for follow up and ongoing treatment. She presents to the clinic today with her interpreter and family member. She reports she is doing okay, she started to have left leg pain. She is taking Dilaudid and Norco at night for the pain/sleep. She states she is taking 500 mg Lapatinib just fine and she has a good appetite. She is compliant with Exemestane and no complaints.   On review of systems, pt denies any other complaints at this time. Pertinent positives are listed and detailed within the above HPI.   MEDICAL HISTORY:  Past Medical History:  Diagnosis Date  . Abnormal Pap smear   . Anxiety   . Breast cancer ([place])   . Cancer ([place]) 1999   breast-s/p  mastectomy, chemo, rad  . CIN I (cervical intraepithelial neoplasia I) 2003   by colpo  . Congenital deafness   . Deaf   . Depression   . Radiation 07/12/15-07/26/15   left femur 30 Gy  . S/P bilateral oophorectomy     SURGICAL HISTORY: Past Surgical History:  Procedure Laterality Date  . ABDOMINAL HYSTERECTOMY    . INTRAMEDULLARY (IM) NAIL INTERTROCHANTERIC Left 06/26/2015   Procedure: LEFT BIOMET LONG AFFIXS NAIL;  Surgeon: [name], MD;  Location: [facility];  Service: Orthopedics;  Laterality: Left;  . IR GENERIC HISTORICAL  08/06/2015   IR US GUIDE VASC ACCESS LEFT 08/06/2015 Aletta Edouard, MD [facility]  . IR GENERIC HISTORICAL  08/06/2015   IR FLUORO GUIDE CV LINE LEFT 08/06/2015 Aletta Edouard, MD [facility]  . IR GENERIC HISTORICAL  03/05/2016   IR CV LINE INJECTION 03/05/2016 Markus Daft, MD [facility]  . MASTECTOMY     right breast  . MASTECTOMY    . OVARIAN CYST REMOVAL    . RADIOLOGY WITH ANESTHESIA Left 06/25/2015   Procedure: MRI OF LEFT HIP WITH OR WITHOUT CONTRAST;  Surgeon: Medication Radiologist, MD;  Location: [facility];  Service: Radiology;  Laterality: Left;  DR. MCINTYRE/MRI  . TUBAL LIGATION      SOCIAL HISTORY: Social History   Socioeconomic History  . Marital status: Married    Spouse name: Not on file  . Number of children: 2  . Years of education: Not on file  . Highest education level: Not on file  Occupational History  . Not on file  Social Needs  . Financial resource strain: Not on file  . Food insecurity:    Worry: Not on file    Inability: Not on file  . Transportation needs:    Medical: Not on file    Non-medical: Not on file  Tobacco Use  . Smoking status: Never Smoker  . Smokeless tobacco: Never Used  Substance and Sexual Activity  . Alcohol use: No  . Drug use: No  . Sexual activity: Yes    Birth control/protection: Surgical  Lifestyle  . Physical activity:    Days per week: Not on file    Minutes per session: Not on file  .  Stress: Not on file  Relationships  . Social connections:    Talks on phone: Not on file    Gets together: Not on file    Attends religious service: Not on file    Active member of club or organization: Not on file    Attends meetings of clubs or  organizations: Not on file    Relationship status: Not on file  . Intimate partner violence:    Fear of current or ex partner: Not on file    Emotionally abused: Not on file    Physically abused: Not on file    Forced sexual activity: Not on file  Other Topics Concern  . Not on file  Social History Narrative  . Not on file    FAMILY HISTORY: Family History  Problem Relation Age of Onset  . Hypertension Mother   . Seizures Mother   . Alzheimer's disease Maternal Uncle   . Pancreatitis Maternal Grandmother   . Heart attack Maternal Grandfather     ALLERGIES:  is allergic to trazodone and nefazodone; chlorhexidine; promethazine hcl; and diphenhydramine hcl.  MEDICATIONS:  Current Outpatient Medications  Medication Sig Dispense Refill  . Cholecalciferol 2000 units CAPS Take 1 capsule by mouth daily.    . diazepam (VALIUM) 5 MG tablet TAKE 1 TABLET BY MOUTH AT BEDTIME AS NEEDED FOR ANXIETY 30 tablet 0  . divalproex (DEPAKOTE) 250 MG DR tablet Take 1 tablet (250 mg total) by mouth at bedtime. 30 tablet 2  . exemestane (AROMASIN) 25 MG tablet Take 1 tablet (25 mg total) by mouth daily after breakfast. 30 tablet 3  . HYDROmorphone (DILAUDID) 2 MG tablet Take 1 tablet (2 mg total) by mouth every 4 (four) hours as needed for severe pain. 30 tablet 0  . lapatinib (TYKERB) 250 MG tablet Take 6 tablets (1,500 mg total) by mouth daily. Take on an empty stomach, 1 hour before or 2 hours after meals. (Patient taking differently: Take 1,500 mg by mouth daily. Take on an empty stomach, 1 hour before or 2 hours after meals.Taking 2 tabs daily) 180 tablet 2  . lidocaine-prilocaine (EMLA) cream Apply 1 application as needed topically. Apply to Porta-Cath  1-2 hours prior to access as directed. 30 g 2  . potassium chloride (K-DUR) 10 MEQ tablet Take 2 tablets (20 mEq total) by mouth daily. 60 tablet 2  . prochlorperazine (COMPAZINE) 10 MG tablet Take 1 tablet (10 mg total) by mouth every 6 (six) hours as needed for nausea or vomiting. 30 tablet 2  . traZODone (DESYREL) 50 MG tablet Take 1 tablet (50 mg total) by mouth at bedtime as needed for sleep. 30 tablet 3  . HYDROcodone-acetaminophen (NORCO) 10-325 MG tablet Take 1 tablet by mouth every 6 (six) hours as needed. 45 tablet 0   No current facility-administered medications for this visit.    Facility-Administered Medications Ordered in Other Visits  Medication Dose Route Frequency Provider Last Rate Last Dose  . sodium chloride 0.9 % 1,000 mL with potassium chloride 10 mEq infusion   Intravenous Continuous Gordy Levan, MD   Stopped at 09/10/15 1653  . sodium chloride 0.9 % injection 10 mL  10 mL Intravenous PRN Livesay, Lennis P, MD        REVIEW OF SYSTEMS:  Constitutional: Denies fevers, chills or abnormal night sweats (+) good appetite Eyes: Denies blurriness of vision, double vision or watery eyes Ears, nose, mouth, throat, and face: Denies mucositis or sore throat (+) deaf Respiratory: Denies cough, dyspnea or wheezes Cardiovascular: Denies palpitation, chest discomfort or lower extremity swelling Gastrointestinal:  Denies nausea, heartburn or change in bowel habits Lymphatics: Denies new lymphadenopathy or easy bruising. MSK: (+) Left leg pain  Behavioral/Psych: (+)mild anxiety All other systems were reviewed with the patient and are negative.  PHYSICAL EXAMINATION: ECOG PERFORMANCE STATUS: 1-2  BP 112/62 (Patient Position: Sitting)   Pulse 60   Temp 97.6 F (36.4 C) (Oral)   Resp 18   Ht '5\' 7"'$  (1.702 m)   Wt 126 lb 1.6 oz (57.2 kg)   SpO2 100%   BMI 19.75 kg/m   GENERAL:alert, no distress and comfortable  EYES: normal, conjunctiva are pink and non-injected, sclera  clear OROPHARYNX:no exudate, no erythema and lips, buccal mucosa, and tongue normal  NECK: supple, thyroid normal size, non-tender, without nodularity LYMPH:  no palpable lymphadenopathy in the cervical, axillary or inguinal LUNGS: clear to auscultation and percussion with normal breathing effort HEART: regular rate & rhythm and no murmurs and no lower extremity edema ABDOMEN:abdomen soft, non-tender and normal bowel sounds. No organomegaly. Musculoskeletal:no cyanosis of digits and no clubbing  PSYCH: alert & oriented x 3 with fluent speech NEURO: no focal motor/sensory deficits Breasts: Breast inspection showed Right breast is surgically absent status post reconstruction. Palpation of the breasts and axilla revealed no obvious mass that I could appreciate.   LABORATORY DATA:  I have reviewed the data as listed CBC Latest Ref Rng & Units 04/12/2017 03/22/2017 03/01/2017  WBC 3.9 - 10.3 K/uL 4.7 4.7 3.9  Hemoglobin 11.6 - 15.9 g/dL 11.7 12.2 11.3(L)  Hematocrit 34.8 - 46.6 % 34.6(L) 35.7 34.1(L)  Platelets 145 - 400 K/uL 140(L) 178 122(L)   CMP Latest Ref Rng & Units 04/12/2017 03/22/2017 03/01/2017  Glucose 70 - 140 mg/dL 83 81 80  BUN 7 - 26 mg/dL 11 7 5(L)  Creatinine 0.60 - 1.10 mg/dL 0.83 0.80 0.83  Sodium 136 - 145 mmol/L 141 142 143  Potassium 3.5 - 5.1 mmol/L 3.3(L) 3.6 3.6  Chloride 98 - 109 mmol/L 105 108 107  CO2 22 - 29 mmol/L '28 27 27  '$ Calcium 8.4 - 10.4 mg/dL 9.2 9.4 9.2  Total Protein 6.4 - 8.3 g/dL 6.6 7.2 6.4  Total Bilirubin 0.2 - 1.2 mg/dL 1.9(H) 1.7(H) 0.8  Alkaline Phos 40 - 150 U/L 69 82 65  AST 5 - 34 U/L '19 18 21  '$ ALT 0 - 55 U/L '16 16 13   '$ PROCEDURES:  ECHO 12/17/16  Study Conclusions - Left ventricle: Inferobasal hypokinesis. The cavity size was   normal. Systolic function was normal. The estimated ejection   fraction was in the range of 50% to 55%. Wall motion was normal;   there were no regional wall motion abnormalities. Left   ventricular diastolic  function parameters were normal. - Atrial septum: No defect or patent foramen ovale was identified. - Impressions: Normal GLS -17.7. Impressions: - Normal GLS -17.7.   Echocardiogram 05/21/2016 Study Conclusions  - Left ventricle: GLS is normal at -20.6% The cavity size was   normal. There was mild focal basal hypertrophy of the septum.   Systolic function was normal. The estimated ejection fraction was   55%. There is akinesis of the basalinferior myocardium. - Aortic valve: Trileaflet; normal thickness, mildly calcified   leaflets. There was trivial regurgitation. - Mitral valve: There was trivial regurgitation. - Tricuspid valve: There was mild regurgitation. - Pulmonic valve: There was trivial regurgitation. - Pulmonary arteries: PA peak pressure: 37 mm Hg (S). Impressions: - The right ventricular systolic pressure was increased consistent   with mild pulmonary hypertension.   RADIOGRAPHIC STUDIES:  PET SCAN: 01/27/17 IMPRESSION: 1. Mixed response to osseous metastasis. Some hypermetabolic lesions are new and increasingly hypermetabolic, while another index lesion is decreasingly hypermetabolic. 2. No evidence of hypermetabolic soft tissue metastasis.  CT  A/P IMPRESSION: 10/28/16 No acute process demonstrated in the abdomen or pelvis. No evidence of bowel obstruction or inflammation.  CT CAP and Bone Scan: 10/05/16  Bones: left intramedullary rod and left femoral head neck screw in place. In the proximal diaphysis of the left femur there is cortical thickening and irregularity. No lytic or blastic bone lesions. No fracture or vertebral endplate destruction.  No definite evidence of recurrent disease in the chest, abdomen, or pelvis.   PET 05/20/2016 IMPRESSION: 1. There multiple metastatic foci in the bony pelvis, including some new abnormal foci compare to the prior PET-CT, compatible with mildly progressive bony metastatic disease. Also, a left T11 vertebral  hypermetabolic metastatic lesion is observed, new compared to the prior exam. 2. No active extraosseous malignancy is currently identified. 3. Right anterior fourth rib healing fracture, appears benign.  NM Bone Scan 05/01/2016 IMPRESSION: 1. Small focal uptake involving the anterior rib ends of the left second and right fourth ribs, new since the prior bone scan. The location of this uptake is more suggestive of a traumatic or inflammatory etiology as opposed to metastatic disease. No other evidence of metastatic disease. 2. There are other areas of stable uptake which are most likely degenerative/reactive, including right shoulder and cervical spine uptake and uptake along the right femur adjacent to the ORIF Hardware.  CT CAP 05/01/2016 IMPRESSION: 1. Stable exam.  No new or progressive disease identified. 2. No evidence for residual or recurrent adenopathy within the chest. 3. Stable bone metastasis.   I have personally reviewed the radiological images as listed and agreed with the findings in the report. No results found.   ASSESSMENT & PLAN:  Mrs. Bedonie is a 52 y.o. postmenopause female with:  1. 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 -Restaging PET 12-25-15 markedly improved of bone metastasis after initial systemic chemo  -she was switched to letrozole and maintenance Herceptin and the perjeta on 01-16-16.  -Did not tolerate SQ xgeva and it was changed to zometa  -Echocardiogram 01-27-16 showed normal EF, will continue monitoring every 3 months when she is on herceptin -I reviewed her CT scan and bone scan from 05/01/2016, which showed stable disease overall, a few new uptake of anterior ribs on bone scan are indeterminate. -She has developed worsening left hip pain, and more fatigued lately. I have clinical concern of  disease progression. -I previously  discussed her restaging PET scan from 05/20/2016. 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 She declined other chemo.  -Labs reviewed, her bilirubin is slightly elevated but stable, adequate to continue treatment. 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  -She moved to [facility] for 3 months, back to [place] now  -We received records from [facility] in [place], [place]. She underwent CT CAP w contrast on 10/02/16 and bone scan on 09/21/16. CT shows no evidence of recurrent disease in the chest, abdomen, or pelvis; Bone scan indicates no lytic or blastic bone lesions.  -She has been on Zometa every 4-6 weeks since June 2017, will change to every 3 months -PET Scan from 01/27/17 images were reviewed in person, it shows a mixed response to osseous metastasis, most bone lesions have improved comparing to 05/2016, but she has developed one new spine metastasis,  and involved her pelvic bone lesion slightly increased in metabolism.  No other visceral organ metastasis. She is clinically doing well.  - I previously discussed that her mixed response is likely indicating her tumor is developing resistance to her Kaycyla. We discussed Treatment options.  Due to her bone metastasis only disease, she is not symptomatic, and previous poor tolerance to chemotherapy, I will switch her antiestrogen therapy and HER-2 antibody alone, and preserve chemotherapy for future.  We will stop letrozole and Kadcyla and switch to Exemestane, oral HER2 antibody treatment lapatinib and herceptin. I previously discussed side effects of lapatinib including diarrhea and skin rash that she can use imodium and hydrocortisone.  She voiced good understanding, and agrees to proceed. -We again discussed the goal of therapy is palliative, to control her disease, and to prolong her  life. Her cancer is incurable at this stage.  She voiced understanding. -She had severe side effects of diarrhea, depression and low appetite with lapatinib on full dose of 1500 mg after 1 week and she stopped. I recommended her to restart with low dose  -She can only tolerate low dose 500 mg of lapatinib, she will continue with this dose until next PET scan. PET scan ordered today -I previously advised her to begin Ca supplement.  -CA 27.29 is trending down , WNL on 03/22/17 -Labs today (04/12/17) platelets 140K, bilirubin is 1.9 today and it was 1.7 3 weeks ago. Will monitor this, likely related to treatment, she is concerned. We will see what PET Scan reveals. Will proceed Herceptin therapy today. - F/u in 3 weeks   2. History of MRSA skin infection scalp, boil at low back scalp and upper lip -I prescribed doxycycline on 04/24/16 for 7 days  - Resolved now.  3.congenital deafness -needs sign language interpreter for all visits.  -Husband also deaf.  4. premature surgical menopause -post BSO 208fr pelvic pain and ovarian cysts, likely was also beneficial from standpoint of the breast cancer. -hysterectomy.   5. Intermittent anemia  -mild, possible related to prior chemo -She is asymptomatic, we'll continue close monitoring  6. Hypokalemia  -K 3.1 on 03/05/16, she is on KCL 275m daily, increased to 4066mdaily for a week. -K 3.2 on 04/03/16. The patient is on KCl 58m41maily, increased to 40me3mily for a week and then take 1 pill a day as normal. -K 3.3 on 07/16/16, continue KCL 58meq80mly  -received IV fluids in 11/25/16  -She does not tolerate liquid potassium so she will continue with tablet form. She will conitnues 20 mEq daily  -potassium is 3.3  today 04/12/17, I recommend her to increase to 20 mg (2 10meq 30m) daily  7. Goal of care discussion  -We previously discussed the incurable nature of her cancer, and the overall poor prognosis, especially if she does not have good  response to chemotherapy or progress on chemo.  -The patient understands the goal of care is palliative. -she is full code for now   8. Left Leg Pain -she initially presented with worsening left leg and hip pain secondary to bone mets, she had surgery before, this was much improved after her prior chemo - The patient previously took Hydrocodone for this pain, which helped. - I refilled Hydrocodone previously on 5/3. She will take 1 tablet every 6 hours as needed for pain. -Pain has recurred. Recent PET scan showed minimum uptake, unlikely related to bone mets progression  - I previously encouraged her to call Dr. Yates [facility]  the pain in her left leg. She has followed-up  With him.  -She requests to have bed for all infusions  -She has increased pain now. We will discontinue Dilaudid daily and I will increased Norco to 10-325. She will use Dilaudid for severe pain.   9. Insomnia -I give her Ambien '5mg'$ , but it did not work well.  -continue ativan 1 mg to take at night as needed  -mirtazapine did not work, she also tried trazodone from PCP which gave her a rash. She stopped taking it.   10.  Hyperbilirubinemia -She has developed mild hyperbilirubinemia 3 weeks ago, TBIL 1.7-1.9, okay to continue Herceptin, lapatinib and exemestane without dose adjustment for now -Related to lapatinib, if she has persistent elevated bilirubin, and next PET scan is negative for liver metastasis, I will stop her lapatinib -continue monitoring   Plan:   -continue lapatinib at reduced dose 500 mg daily -continue Exemestane daily -Increase K supplement to 40 mg daily -will proceed Herceptin today and continue every 3 weeks -We will discontinue Dilaudid daily and I will increased Norco to 10-325. Gave 45 tablets today.  -PET Scan in 3 weeks -F/u in 3 weeks with lab  Pt had many questions. All questions were answered. The patient knows to call the clinic with any problems, questions or concerns.  I spent 30  minutes counseling the patient face to face. The total time spent in the appointment was 40 minutes and more than 50% was on counseling.  This document serves as a record of services personally performed by Truitt Merle, MD. It was created on her behalf by Theresia Bough, a trained medical scribe. The creation of this record is based on the scribe's personal observations and the provider's statements to them.   I have reviewed the above documentation for accuracy and completeness, and I agree with the above.    Truitt Merle, MD 04/12/2017

## 2017-04-12 ENCOUNTER — Other Ambulatory Visit: Payer: Medicaid Other

## 2017-04-12 ENCOUNTER — Inpatient Hospital Stay: Payer: Medicaid Other

## 2017-04-12 ENCOUNTER — Ambulatory Visit: Payer: Medicaid Other | Admitting: Hematology

## 2017-04-12 ENCOUNTER — Inpatient Hospital Stay: Payer: Medicaid Other | Attending: Hematology

## 2017-04-12 ENCOUNTER — Encounter: Payer: Self-pay | Admitting: Hematology

## 2017-04-12 ENCOUNTER — Inpatient Hospital Stay (HOSPITAL_BASED_OUTPATIENT_CLINIC_OR_DEPARTMENT_OTHER): Payer: Medicaid Other | Admitting: Hematology

## 2017-04-12 VITALS — BP 112/62 | HR 60 | Temp 97.6°F | Resp 18 | Ht 67.0 in | Wt 126.1 lb

## 2017-04-12 DIAGNOSIS — H905 Unspecified sensorineural hearing loss: Secondary | ICD-10-CM

## 2017-04-12 DIAGNOSIS — Z79899 Other long term (current) drug therapy: Secondary | ICD-10-CM

## 2017-04-12 DIAGNOSIS — Z170 Estrogen receptor positive status [ER+]: Secondary | ICD-10-CM

## 2017-04-12 DIAGNOSIS — Z5112 Encounter for antineoplastic immunotherapy: Secondary | ICD-10-CM | POA: Diagnosis present

## 2017-04-12 DIAGNOSIS — Z95828 Presence of other vascular implants and grafts: Secondary | ICD-10-CM

## 2017-04-12 DIAGNOSIS — Z9221 Personal history of antineoplastic chemotherapy: Secondary | ICD-10-CM | POA: Diagnosis not present

## 2017-04-12 DIAGNOSIS — D649 Anemia, unspecified: Secondary | ICD-10-CM | POA: Diagnosis not present

## 2017-04-12 DIAGNOSIS — M25552 Pain in left hip: Secondary | ICD-10-CM | POA: Diagnosis not present

## 2017-04-12 DIAGNOSIS — C7951 Secondary malignant neoplasm of bone: Secondary | ICD-10-CM

## 2017-04-12 DIAGNOSIS — E876 Hypokalemia: Secondary | ICD-10-CM | POA: Insufficient documentation

## 2017-04-12 DIAGNOSIS — C50911 Malignant neoplasm of unspecified site of right female breast: Secondary | ICD-10-CM

## 2017-04-12 DIAGNOSIS — Z8614 Personal history of Methicillin resistant Staphylococcus aureus infection: Secondary | ICD-10-CM

## 2017-04-12 DIAGNOSIS — G4700 Insomnia, unspecified: Secondary | ICD-10-CM | POA: Insufficient documentation

## 2017-04-12 DIAGNOSIS — Z90710 Acquired absence of both cervix and uterus: Secondary | ICD-10-CM

## 2017-04-12 DIAGNOSIS — Z79811 Long term (current) use of aromatase inhibitors: Secondary | ICD-10-CM

## 2017-04-12 DIAGNOSIS — C50919 Malignant neoplasm of unspecified site of unspecified female breast: Secondary | ICD-10-CM

## 2017-04-12 DIAGNOSIS — C799 Secondary malignant neoplasm of unspecified site: Secondary | ICD-10-CM

## 2017-04-12 LAB — COMPREHENSIVE METABOLIC PANEL
ALBUMIN: 3.5 g/dL (ref 3.5–5.0)
ALT: 16 U/L (ref 0–55)
AST: 19 U/L (ref 5–34)
Alkaline Phosphatase: 69 U/L (ref 40–150)
Anion gap: 8 (ref 3–11)
BUN: 11 mg/dL (ref 7–26)
CHLORIDE: 105 mmol/L (ref 98–109)
CO2: 28 mmol/L (ref 22–29)
CREATININE: 0.83 mg/dL (ref 0.60–1.10)
Calcium: 9.2 mg/dL (ref 8.4–10.4)
GFR calc Af Amer: >60 mL/min (ref >60)
GLUCOSE: 83 mg/dL (ref 70–140)
Potassium: 3.3 mmol/L — ABNORMAL LOW (ref 3.5–5.1)
Sodium: 141 mmol/L (ref 136–145)
Total Bilirubin: 1.9 mg/dL — ABNORMAL HIGH (ref 0.2–1.2)
Total Protein: 6.6 g/dL (ref 6.4–8.3)

## 2017-04-12 LAB — CBC WITH DIFFERENTIAL/PLATELET
BASOS ABS: 0 10*3/uL (ref 0.0–0.1)
Basophils Relative: 0 %
EOS PCT: 3 %
Eosinophils Absolute: 0.1 10*3/uL (ref 0.0–0.5)
HCT: 34.6 % — ABNORMAL LOW (ref 34.8–46.6)
Hemoglobin: 11.7 g/dL (ref 11.6–15.9)
Lymphocytes Relative: 41 %
Lymphs Abs: 1.9 10*3/uL (ref 0.9–3.3)
MCH: 29.6 pg (ref 25.1–34.0)
MCHC: 33.8 g/dL (ref 31.5–36.0)
MCV: 87.6 fL (ref 79.5–101.0)
MONO ABS: 0.4 10*3/uL (ref 0.1–0.9)
MONOS PCT: 8 %
Neutro Abs: 2.2 10*3/uL (ref 1.5–6.5)
Neutrophils Relative %: 48 %
PLATELETS: 140 10*3/uL — AB (ref 145–400)
RBC: 3.95 MIL/uL (ref 3.70–5.45)
RDW: 13.1 % (ref 11.2–14.5)
WBC: 4.7 10*3/uL (ref 3.9–10.3)

## 2017-04-12 MED ORDER — LORATADINE 10 MG PO TABS
10.0000 mg | ORAL_TABLET | Freq: Every day | ORAL | Status: DC
  Administered 2017-04-12: 10 mg via ORAL

## 2017-04-12 MED ORDER — SODIUM CHLORIDE 0.9 % IJ SOLN
10.0000 mL | INTRAMUSCULAR | Status: DC | PRN
  Administered 2017-04-12: 10 mL via INTRAVENOUS
  Filled 2017-04-12: qty 10

## 2017-04-12 MED ORDER — LORATADINE 10 MG PO TABS
ORAL_TABLET | ORAL | Status: AC
  Filled 2017-04-12: qty 1

## 2017-04-12 MED ORDER — TRASTUZUMAB CHEMO 150 MG IV SOLR
6.0000 mg/kg | Freq: Once | INTRAVENOUS | Status: AC
  Administered 2017-04-12: 357 mg via INTRAVENOUS
  Filled 2017-04-12: qty 17

## 2017-04-12 MED ORDER — ACETAMINOPHEN 325 MG PO TABS
ORAL_TABLET | ORAL | Status: AC
  Filled 2017-04-12: qty 2

## 2017-04-12 MED ORDER — SODIUM CHLORIDE 0.9 % IV SOLN
Freq: Once | INTRAVENOUS | Status: AC
  Administered 2017-04-12: 12:00:00 via INTRAVENOUS

## 2017-04-12 MED ORDER — ACETAMINOPHEN 325 MG PO TABS
650.0000 mg | ORAL_TABLET | Freq: Once | ORAL | Status: AC
  Administered 2017-04-12: 650 mg via ORAL

## 2017-04-12 MED ORDER — HYDROCODONE-ACETAMINOPHEN 10-325 MG PO TABS: 1.0000 | ORAL_TABLET | Freq: Four times a day (QID) | ORAL | 0 refills | Status: DC | PRN

## 2017-04-12 MED ORDER — HEPARIN SOD (PORK) LOCK FLUSH 100 UNIT/ML IV SOLN
500.0000 [IU] | Freq: Once | INTRAVENOUS | Status: AC | PRN
  Administered 2017-04-12: 500 [IU]
  Filled 2017-04-12: qty 5

## 2017-04-12 MED ORDER — SODIUM CHLORIDE 0.9% FLUSH
10.0000 mL | INTRAVENOUS | Status: DC | PRN
  Administered 2017-04-12: 10 mL
  Filled 2017-04-12: qty 10

## 2017-04-12 NOTE — Progress Notes (Signed)
Dr Burr Medico addressed K of 3.3 with pt and per Dr Burr Medico ok to tx today with bilirubin of 1.9.

## 2017-04-12 NOTE — Progress Notes (Signed)
Will treat today and MD Burr Medico is ordering a new echo to be done prior to next treatment

## 2017-04-12 NOTE — Patient Instructions (Signed)
[facility] Discharge Instructions for Patients Receiving Chemotherapy  Today you received the following chemotherapy agents Herceptin.   To help prevent nausea and vomiting after your treatment, we encourage you to take your nausea medication as directed.   If you develop nausea and vomiting that is not controlled by your nausea medication, call the clinic.   BELOW ARE SYMPTOMS THAT SHOULD BE REPORTED IMMEDIATELY:  *FEVER GREATER THAN 100.5 F  *CHILLS WITH OR WITHOUT FEVER  NAUSEA AND VOMITING THAT IS NOT CONTROLLED WITH YOUR NAUSEA MEDICATION  *UNUSUAL SHORTNESS OF BREATH  *UNUSUAL BRUISING OR BLEEDING  TENDERNESS IN MOUTH AND THROAT WITH OR WITHOUT PRESENCE OF ULCERS  *URINARY PROBLEMS  *BOWEL PROBLEMS  UNUSUAL RASH Items with * indicate a potential emergency and should be followed up as soon as possible.  Feel free to call the clinic should you have any questions or concerns. The clinic phone number is [phone].  Please show the [facility] at check-in to the Emergency Department and triage nurse.  Zoledronic Acid injection (Hypercalcemia, Oncology) What is this medicine? ZOLEDRONIC ACID (ZOE le dron ik AS id) lowers the amount of calcium loss from bone. It is used to treat too much calcium in your blood from cancer. It is also used to prevent complications of cancer that has spread to the bone. This medicine may be used for other purposes; ask your health care provider or pharmacist if you have questions. COMMON BRAND NAME(S): Zometa What should I tell my health care provider before I take this medicine? They need to know if you have any of these conditions: -aspirin-sensitive asthma -cancer, especially if you are receiving medicines used to treat cancer -dental disease or wear dentures -infection -kidney disease -receiving corticosteroids like dexamethasone or prednisone -an unusual or allergic reaction to zoledronic acid, other medicines,  foods, dyes, or preservatives -pregnant or trying to get pregnant -breast-feeding How should I use this medicine? This medicine is for infusion into a vein. It is given by a health care professional in a hospital or clinic setting. Talk to your pediatrician regarding the use of this medicine in children. Special care may be needed. Overdosage: If you think you have taken too much of this medicine contact a poison control center or emergency room at once. NOTE: This medicine is only for you. Do not share this medicine with others. What if I miss a dose? It is important not to miss your dose. Call your doctor or health care professional if you are unable to keep an appointment. What may interact with this medicine? -certain antibiotics given by injection -NSAIDs, medicines for pain and inflammation, like ibuprofen or naproxen -some diuretics like bumetanide, furosemide -teriparatide -thalidomide This list may not describe all possible interactions. Give your health care provider a list of all the medicines, herbs, non-prescription drugs, or dietary supplements you use. Also tell them if you smoke, drink alcohol, or use illegal drugs. Some items may interact with your medicine. What should I watch for while using this medicine? Visit your doctor or health care professional for regular checkups. It may be some time before you see the benefit from this medicine. Do not stop taking your medicine unless your doctor tells you to. Your doctor may order blood tests or other tests to see how you are doing. Women should inform their doctor if they wish to become pregnant or think they might be pregnant. There is a potential for serious side effects to an unborn child. Talk  to your health care professional or pharmacist for more information. You should make sure that you get enough calcium and vitamin D while you are taking this medicine. Discuss the foods you eat and the vitamins you take with your health  care professional. Some people who take this medicine have severe bone, joint, and/or muscle pain. This medicine may also increase your risk for jaw problems or a broken thigh bone. Tell your doctor right away if you have severe pain in your jaw, bones, joints, or muscles. Tell your doctor if you have any pain that does not go away or that gets worse. Tell your dentist and dental surgeon that you are taking this medicine. You should not have major dental surgery while on this medicine. See your dentist to have a dental exam and fix any dental problems before starting this medicine. Take good care of your teeth while on this medicine. Make sure you see your dentist for regular follow-up appointments. What side effects may I notice from receiving this medicine? Side effects that you should report to your doctor or health care professional as soon as possible: -allergic reactions like skin rash, itching or hives, swelling of the face, lips, or tongue -anxiety, confusion, or depression -breathing problems -changes in vision -eye pain -feeling faint or lightheaded, falls -jaw pain, especially after dental work -mouth sores -muscle cramps, stiffness, or weakness -redness, blistering, peeling or loosening of the skin, including inside the mouth -trouble passing urine or change in the amount of urine Side effects that usually do not require medical attention (report to your doctor or health care professional if they continue or are bothersome): -bone, joint, or muscle pain -constipation -diarrhea -fever -hair loss -irritation at site where injected -loss of appetite -nausea, vomiting -stomach upset -trouble sleeping -trouble swallowing -weak or tired This list may not describe all possible side effects. Call your doctor for medical advice about side effects. You may report side effects to FDA at 1-800-FDA-1088. Where should I keep my medicine? This drug is given in a hospital or clinic and  will not be stored at home. NOTE: This sheet is a summary. It may not cover all possible information. If you have questions about this medicine, talk to your doctor, pharmacist, or health care provider.  2018 [employer] Standard (2013-05-27 14:19:39)

## 2017-04-14 NOTE — Addendum Note (Signed)
Addended by: Truitt Merle on: 04/14/2017 08:17 AM   Modules accepted: Orders

## 2017-04-19 ENCOUNTER — Other Ambulatory Visit: Payer: Self-pay | Admitting: Hematology

## 2017-04-19 DIAGNOSIS — C7951 Secondary malignant neoplasm of bone: Principal | ICD-10-CM

## 2017-04-19 DIAGNOSIS — C50911 Malignant neoplasm of unspecified site of right female breast: Secondary | ICD-10-CM

## 2017-04-21 ENCOUNTER — Telehealth: Payer: Self-pay | Admitting: *Deleted

## 2017-04-21 NOTE — Telephone Encounter (Signed)
"  [name] with [facility].  Patient reports she no longer takes six Tykerb daily but two.  Need a new prescription with new orders to ship Tykerb tomorrow."    Routing call information to collaborative nurse and provider for review.  Further patient communication through collaborative nurse.

## 2017-04-22 ENCOUNTER — Ambulatory Visit (HOSPITAL_COMMUNITY): Payer: Medicaid Other | Attending: Hematology

## 2017-04-22 ENCOUNTER — Other Ambulatory Visit: Payer: Self-pay | Admitting: *Deleted

## 2017-04-22 ENCOUNTER — Other Ambulatory Visit: Payer: Self-pay

## 2017-04-22 DIAGNOSIS — D649 Anemia, unspecified: Secondary | ICD-10-CM | POA: Insufficient documentation

## 2017-04-22 DIAGNOSIS — I071 Rheumatic tricuspid insufficiency: Secondary | ICD-10-CM | POA: Diagnosis not present

## 2017-04-22 DIAGNOSIS — C7951 Secondary malignant neoplasm of bone: Principal | ICD-10-CM

## 2017-04-22 DIAGNOSIS — C50911 Malignant neoplasm of unspecified site of right female breast: Secondary | ICD-10-CM

## 2017-04-22 MED ORDER — LAPATINIB DITOSYLATE 250 MG PO TABS: 500.0000 mg | ORAL_TABLET | Freq: Every day | ORAL | 2 refills | Status: DC

## 2017-04-22 MED FILL — TYKERB 250 MG TABLET: 250 | 30 days supply | Qty: 60 | Fill #0

## 2017-04-26 ENCOUNTER — Other Ambulatory Visit: Payer: Medicaid Other

## 2017-04-26 ENCOUNTER — Ambulatory Visit: Payer: Medicaid Other | Admitting: Hematology

## 2017-04-30 ENCOUNTER — Other Ambulatory Visit: Payer: Self-pay | Admitting: Pharmacist

## 2017-04-30 ENCOUNTER — Ambulatory Visit (HOSPITAL_COMMUNITY)
Admission: RE | Admit: 2017-04-30 | Discharge: 2017-04-30 | Disposition: A | Discharge status: Home / Self Care | Payer: Medicaid Other | Source: Ambulatory Visit | Attending: Hematology | Admitting: Hematology

## 2017-04-30 DIAGNOSIS — C7951 Secondary malignant neoplasm of bone: Secondary | ICD-10-CM | POA: Insufficient documentation

## 2017-04-30 DIAGNOSIS — E041 Nontoxic single thyroid nodule: Secondary | ICD-10-CM | POA: Insufficient documentation

## 2017-04-30 DIAGNOSIS — C50911 Malignant neoplasm of unspecified site of right female breast: Secondary | ICD-10-CM | POA: Insufficient documentation

## 2017-04-30 LAB — GLUCOSE, CAPILLARY: GLUCOSE-CAPILLARY: 83 mg/dL (ref 65–99)

## 2017-04-30 MED ORDER — FLUDEOXYGLUCOSE F - 18 (FDG) INJECTION
6.2700 | Freq: Once | INTRAVENOUS | Status: AC
  Administered 2017-04-30: 6.27 via INTRAVENOUS

## 2017-04-30 NOTE — Progress Notes (Signed)
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 She had mastectomy with the 7 axillary node evaluation, 4 cycles of adriamycin/ cytoxan followed by taxotere, then five years of tamoxifen thru June 2005 (no herceptin in 1999). She had bilateral oophorectomy in May 2005. She was briefly on aromatase inhibitor after tamoxifen, but discontinued this herself due to poor tolerance.      04/04/2015 Genetic Testing    Negative genetic testing on the breast/ovarian cancer pnael.  The Breast/Ovarian gene panel offered by [facility] includes sequencing and rearrangement analysis for the following 20 genes:  ATM, BARD1, BRCA1, BRCA2, BRIP1, CDH1, CHEK2, EPCAM, FANCC, MLH1, MSH2, MSH6, NBN, PALB2, PMS2, PTEN, RAD51C, RAD51D, TP53, and XRCC2.      06/26/2015 Pathology Results    Initial Path Report Bone, curettage, Left femur met, intramedullary subtroch tissue - METASTATIC ADENOCARCINOMA.  Estrogen Receptor: 95%, POSITIVE, STRONG STAINING INTENSITY Progesterone Receptor: 2%, POSITIVE, STRONG  HER2 (+), IHC 3+      06/26/2015 Surgery    Biopsy of metastatic tissue and stabilization with Affixus trochanteric nail, proximal and distal interlock of left femur by Dr. [name]       06/28/2015 Imaging    CT Chest w/ Contrast 1. Extensive right internal mammary chain lymphadenopathy consistent with metastatic breast cancer. 2. Lytic metastases involving the posterior elements at  T1 with probable nondisplaced pathologic fracture of the spinous process. 3. No other evidence of thoracic metastatic disease. 4. Trace bilateral pleural effusions with associated bibasilar atelectasis. 5. Indeterminate right thyroid nodule, not previously imaged. This could be evaluated with thyroid ultrasound as clinically warranted.      07/01/2015 Imaging    Bone Scan 1. Increased activity noted throughout the left femur. Although metastatic disease cannot be excluded, these changes are most likely secondary to prior surgery. 2. No other focal abnormalities identified to suggest metastatic disease.      07/12/2015 - 07/26/2015 Radiation Therapy    Left femur, 30 Gy in 10 fractions by Dr. Sondra Come       07/14/2015 Initial Diagnosis    [name], right ([place])      07/19/2015 Imaging    Initial PET Scan 1. Severe multifocal osseous metastatic disease, much greater than anticipated based on prior imaging. 2. Multifocal nodal metastases involving the right internal mammary, prevascular and subpectoral lymph nodes. No axillary pulmonary involvement identified. 3. No distant extra osseous metastases.      07/30/2015 - 11/29/2015 Chemotherapy    Docetaxel, Herceptin and Perjeta every 3 weeks X6 cycles, pt tolerated moderately well, restaging scan showed excellent response       09/18/2015 Imaging    CT Head w/wo Contrast 1. No acute intracranial abnormality or significant interval change. 2. Stable minimal periventricular white matter hypoattenuation on the right. This may reflect a remote ischemic injury. 3. No evidence for metastatic disease to the brain. 4. No focal soft tissue lesion to explain the patient's tenderness.  10/30/2015 Imaging    CT Abdomen Pelvis w/ Contrast 1. Few mildly prominent fluid-filled loops of small bowel scattered throughout the abdomen, with intraluminal fluid density within the distal colon. Given the provided history, findings  are suggestive of acute enteritis/diarrheal illness. 2. No other acute intra-abdominal or pelvic process identified. 3. Widespread osseous metastatic disease, better evaluated on most recent PET-CT from 07/19/2015. No pathologic fracture or other complication. 4. 11 mm hypodensity within the left kidney. This lesion measures intermediate density, and is indeterminate. While this lesion is similar in size relative to recent studies, this is increased in size relative to prior study from 2012. Further evaluation with dedicated renal mass protocol CT and/or MRI is recommended for complete characterization.      12/25/2015 Imaging    Restaging PET Scan 1. Markedly improved skeletal activity, with only several faint foci of residual accentuated metabolic activity, but resolution of the vast majority of the previously extensive osseous metastatic disease. 2. Resolution of the prior right internal mammary and right prevascular lymph nodes. 3. Residual subcutaneous edema and edema along fascia planes in the left upper thigh. This remains somewhat more than I would expect for placement of an IM nail 6 weeks ago. If there is leg swelling further down, consider Doppler venous ultrasound to rule out left lower extremity DVT. I do not perceive an obvious difference in density between the pelvic and common femoral veins on the noncontrast CT data. 4. Chronic right maxillary and right sphenoid sinusitis.      12/2015 -  Chemotherapy    Maintenance Herceptin and pejeta every 3 weeks      01/16/2016 -  Anti-estrogen oral therapy    Letrozole 2.5 mg daily      03/01/2016 Miscellaneous    Patient presented to ED following a fall; she reports she landed on her back and is now experiencing back pain. The patient was evaluated and discharge home same day with pain control.      05/01/2016 Imaging    CT CAP IMPRESSION: 1. Stable exam.  No new or progressive disease identified. 2. No evidence for residual or  recurrent adenopathy within the chest. 3. Stable bone metastasis.      05/01/2016 Imaging    BONE SCAN IMPRESSION: 1. Small focal uptake involving the anterior rib ends of the left second and right fourth ribs, new since the prior bone scan. The location of this uptake is more suggestive of a traumatic or inflammatory etiology as opposed to metastatic disease. No other evidence of metastatic disease. 2. There are other areas of stable uptake which are most likely degenerative/reactive, including right shoulder and cervical spine uptake and uptake along the right femur adjacent to the ORIF hardware.      05/20/2016 Imaging    NM PET Skull to Thigh IMPRESSION: 1. There multiple metastatic foci in the bony pelvis, including some new abnormal foci compare to the prior PET-CT, compatible with mildly progressive bony metastatic disease. Also, a left T11 vertebral hypermetabolic metastatic lesion is observed, new compared to the prior exam. 2. No active extraosseous malignancy is currently identified. 3. Right anterior fourth rib healing fracture, appears be      06/04/2016 -  Chemotherapy    Second line chemotherapy Kadcyla every 3 weeks started on 06/04/16       10/05/2016 Imaging    CT CAP and Bone Scan:  Bones: left intramedullary rod and left femoral head neck screw in place. In the proximal diaphysis of the left femur  there is cortical thickening and irregularity. No lytic or blastic bone lesions. No fracture or vertebral endplate destruction.   No definite evidence of recurrent disease in the chest, abdomen, or pelvis.      10/28/2016 Imaging    CT A/P IMPRESSION: No acute process demonstrated in the abdomen or pelvis. No evidence of bowel obstruction or inflammation.      11/04/2016 Imaging    DG foot complete left: IMPRESSION: No fracture or dislocation.  No soft tissue abnormality      11/23/2016 Imaging    CT RIGHT FEMUR IMPRESSION: Negative CT scan of the  right thigh.  No visible metastatic disease.  CT LEFT FEMUR IMPRESSION: 1. Postsurgical changes related to prior cephalomedullary rod fixation of the left femur with linear lucency along the anterolateral proximal femoral cortex at level of the lesser trochanter, suspicious for nondisplaced fracture. 2. Slightly permeative appearance of the proximal left femoral cortex at the site of known prior osseous metastases is largely unchanged. 3. Unchanged patchy lucency and sclerosis along the anterior acetabulum, which appears to correspond with an area of faint uptake on the prior PET-CT, suspicious for osseous metastasis. These results will be called to the ordering clinician or representative by the Radiologist Assistant, and communication documented in the PACS or zVision Dashboard.        01/27/2017 Imaging    IMPRESSION: 1. Mixed response to osseous metastasis. Some hypermetabolic lesions are new and increasingly hypermetabolic, while another index lesion is decreasingly hypermetabolic. 2. No evidence of hypermetabolic soft tissue metastasis.       04/30/2017 PET scan    IMPRESSION: 1. Primarily increase in hypermetabolism of osseous metastasis. An isolated right acetabular lesion is decreased in hypermetabolism since the prior exam. 2. No evidence of hypermetabolic soft tissue metastasis. 3. Similar non FDG avid right-sided thyroid nodule, favoring a benign etiology. Recommend attention on follow-up.       HISTORY OF PRESENTING ILLNESS (From Dr. Mariana Kaufman note on 02/06/2016):  [name] 52 y.o. 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 She has had an excellent response to 6 cycles of taxotere herceptin perjeta from 07-30-15 thru 11-29-15. She is continuing herceptin and perjeta every 3 weeks. She had extreme local pain with first xgeva injection on 01-16-16 and prefers  resuming previous zometa. She began letrozole ~ 01-16-16, has no complaints about this at all.  Last imaging was PET 12-25-15.  NOTE CTs, plain Xrays and bone scan did NOT show any of the extensively metastatic bone involvement 06-2015; MRI and PET did image disease clearly.  Patient continues to do very well overall. MRSA skin lesions on scalp and face improved quickly with doxycycline starting 01-16-16. She continues to use Dial soap and we have again strongly encouraged good hygiene with other family members at home. She has had the 3 lymphedema PT sessions allowed by insurance, continues to do massage for LLE herself, still has not gotten the ordered compression apparatus for home and will follow up with PT on that. Swelling in LLE has improved with the interventions and does not seem as uncomfortable. She denies any pain. Appetite is excellent, bowels fine, no SOB, good energy, no problems with PAC, no noted changes in left breast. No bleeding. No fever or symptoms of infection. She slipped in tub last week, fell onto right arm fortunately without injury. She was upset by a visitor and shaky then, but no tremors otherwise.  She has two children and 4 grandchildren. She has had genetic testing and it was negative. She lives at home with her husband. She is very active. Her children live close to her.   CURRENT THERAPY:  Letrozole 2.30m daily, second line Kadcyla every 3 weeks started on 06/04/16. Stopped letrozole and Kadcyla on 02/03/17. Switched to 25 mg Exemestane, lapatinib and herceptin every 3 weeks. She can only tolerate 500 mg lapatinib daily. Discontinue lapatinib on 05/03/17 due to disease progression. Start Verzenio 150 mg   INTERIM HISTORY:  Debra GREEARreturns today for follow up and ongoing treatment. She presents to the clinic today with her interpreter and family member. She reports she is doing okay overall. She reports that after she had her port accessed on Friday at Radiology she  broke out in a rash and was itching around the area. She did not try anything for this but the itching has resolved today.   On review of systems, pt denies any other complaints at this time. Pertinent positives are listed and detailed within the above HPI.   MEDICAL HISTORY:  Past Medical History:  Diagnosis Date  . Abnormal Pap smear   . Anxiety   . [place]   . [place]   breast-s/p mastectomy, chemo, rad  . CIN I (cervical intraepithelial neoplasia I) 2003   by colpo  . Congenital deafness   . Deaf   . Depression   . Radiation 07/12/15-07/26/15   left femur 30 Gy  . S/P bilateral oophorectomy     SURGICAL HISTORY: Past Surgical History:  Procedure Laterality Date  . ABDOMINAL HYSTERECTOMY    . INTRAMEDULLARY (IM) NAIL INTERTROCHANTERIC Left 06/26/2015   Procedure: LEFT BIOMET LONG AFFIXS NAIL;  Surgeon: MMarybelle Killings MD;  [facility]  Service: Orthopedics;  Laterality: Left;  . IR GENERIC HISTORICAL  08/06/2015   IR [place] V[place] ACC[place] LEFT 08/06/2015 GAletta Edouard MD [facility]INTER[facility]  . IR GENERIC HISTORICAL  08/06/2015   IR FLUORO GUIDE CV LINE LEFT 08/06/2015 GAletta Edouard MD [facility]  . IR GENERIC HISTORICAL  03/05/2016   IR CV LINE INJECTION 03/05/2016 AMarkus Daft MD [facility]  . MASTECTOMY     right breast  . MASTECTOMY    . OVARIAN CYST REMOVAL    . RADIOLOGY WITH ANESTHESIA Left 06/25/2015   Procedure: MRI OF LEFT HIP WITH OR WITHOUT CONTRAST;  Surgeon: Medication Radiologist, MD;  [facility] [facility]  Service: Radiology;  Laterality: Left;  DR. MCINTYRE/MRI  . TUBAL LIGATION      SOCIAL HISTORY: Social History   Socioeconomic History  . Marital status: Married    Spouse name: Not on file  . Number of children: 2  . Years of education: Not on file  . Highest education level: Not on file  Occupational History  . Not on file  Social Needs  . Financial resource strain: Not on file  . Food insecurity:    Worry: Not on file     Inability: Not on file  . Transportation needs:    Medical: Not on file    Non-medical: Not on file  Tobacco Use  . Smoking status: Never Smoker  . Smokeless tobacco: Never Used  Substance and Sexual Activity  . Alcohol use: No  . Drug use: No  . Sexual activity: Yes    Birth control/protection: Surgical  Lifestyle  . Physical activity:    Days per week: Not on file  Minutes per session: Not on file  . Stress: Not on file  Relationships  . Social connections:    Talks on phone: Not on file    Gets together: Not on file    Attends religious service: Not on file    Active member of club or organization: Not on file    Attends meetings of clubs or organizations: Not on file    Relationship status: Not on file  . Intimate partner violence:    Fear of current or ex partner: Not on file    Emotionally abused: Not on file    Physically abused: Not on file    Forced sexual activity: Not on file  Other Topics Concern  . Not on file  Social History Narrative  . Not on file    FAMILY HISTORY: Family History  Problem Relation Age of Onset  . Hypertension Mother   . Seizures Mother   . Alzheimer's disease Maternal Uncle   . Pancreatitis Maternal Grandmother   . Heart attack Maternal Grandfather     ALLERGIES:  is allergic to chlorhexidine; promethazine hcl; and diphenhydramine hcl.  MEDICATIONS:  Current Outpatient Medications  Medication Sig Dispense Refill  . Cholecalciferol 2000 units CAPS Take 1 capsule by mouth daily.    . diazepam (VALIUM) 5 MG tablet TAKE 1 TABLET BY MOUTH AT BEDTIME AS NEEDED FOR ANXIETY 30 tablet 0  . divalproex (DEPAKOTE) 250 MG DR tablet Take 1 tablet (250 mg total) by mouth at bedtime. 30 tablet 2  . exemestane (AROMASIN) 25 MG tablet Take 1 tablet (25 mg total) by mouth daily after breakfast. 30 tablet 3  . HYDROcodone-acetaminophen (NORCO) 10-325 MG tablet Take 1 tablet by mouth every 6 (six) hours as needed. 45 tablet 0  . HYDROmorphone  (DILAUDID) 2 MG tablet Take 1 tablet (2 mg total) by mouth every 4 (four) hours as needed for severe pain. 30 tablet 0  . lidocaine-prilocaine (EMLA) cream Apply 1 application as needed topically. Apply to Porta-Cath 1-2 hours prior to access as directed. 30 g 2  . potassium chloride (K-DUR) 10 MEQ tablet Take 2 tablets (20 mEq total) by mouth daily. 60 tablet 2  . prochlorperazine (COMPAZINE) 10 MG tablet Take 1 tablet (10 mg total) by mouth every 6 (six) hours as needed for nausea or vomiting. 30 tablet 2  . traZODone (DESYREL) 50 MG tablet Take 1 tablet (50 mg total) by mouth at bedtime as needed for sleep. 30 tablet 3  . abemaciclib (VERZENIO) 150 MG tablet Take 1 tablet (150 mg total) by mouth 2 (two) times daily. Swallow tablets whole. Do not chew, crush, or split tablets before swallowing. 60 tablet 0   No current facility-administered medications for this visit.    Facility-Administered Medications Ordered in Other Visits  Medication Dose Route Frequency Provider Last Rate Last Dose  . heparin lock flush 100 unit/mL  500 Units Intracatheter Once PRN Truitt Merle, MD      . loratadine (CLARITIN) tablet 10 mg  10 mg Oral Daily Truitt Merle, MD   10 mg at 05/03/17 1126  . sodium chloride 0.9 % 1,000 mL with potassium chloride 10 mEq infusion   Intravenous Continuous Gordy Levan, MD   Stopped at 09/10/15 1653  . sodium chloride 0.9 % injection 10 mL  10 mL Intravenous PRN Livesay, Lennis P, MD      . sodium chloride flush (NS) 0.9 % injection 10 mL  10 mL Intracatheter PRN Truitt Merle, MD      .  trastuzumab (HERCEPTIN) 357 mg in sodium chloride 0.9 % 250 mL chemo infusion  6 mg/kg (Treatment Plan Recorded) Intravenous Once Truitt Merle, MD        REVIEW OF SYSTEMS:  Constitutional: Denies fevers, chills or abnormal night sweats (+) good appetite Eyes: Denies blurriness of vision, double vision or watery eyes Ears, nose, mouth, throat, and face: Denies mucositis or sore throat (+)  deaf Respiratory: Denies cough, dyspnea or wheezes Cardiovascular: Denies palpitation, chest discomfort or lower extremity swelling Gastrointestinal:  Denies nausea, heartburn or change in bowel habits Lymphatics: Denies new lymphadenopathy or easy bruising. MSK: (+) Left leg pain  Behavioral/Psych: (+)mild anxiety All other systems were reviewed with the patient and are negative.  PHYSICAL EXAMINATION: ECOG PERFORMANCE STATUS: 1-2 BP 122/62 (BP Location: Left Arm, Patient Position: Sitting)   Pulse (!) 59   Temp 97.8 F (36.6 C) (Oral)   Resp 18   Ht 5' 7" (1.702 m)   Wt 125 lb 9.6 oz (57 kg)   SpO2 100%   BMI 19.67 kg/m   GENERAL:alert, no distress and comfortable  EYES: normal, conjunctiva are pink and non-injected, sclera clear OROPHARYNX:no exudate, no erythema and lips, buccal mucosa, and tongue normal  NECK: supple, thyroid normal size, non-tender, without nodularity LYMPH:  no palpable lymphadenopathy in the cervical, axillary or inguinal LUNGS: clear to auscultation and percussion with normal breathing effort HEART: regular rate & rhythm and no murmurs and no lower extremity edema ABDOMEN:abdomen soft, non-tender and normal bowel sounds. No organomegaly. Musculoskeletal:no cyanosis of digits and no clubbing  PSYCH: alert & oriented x 3 with fluent speech NEURO: no focal motor/sensory deficits Breasts: Breast inspection showed Right breast is surgically absent status post reconstruction. Palpation of the breasts and axilla revealed no obvious mass that I could appreciate.   LABORATORY DATA:  I have reviewed the data as listed CBC Latest Ref Rng & Units 05/03/2017 04/12/2017 03/22/2017  WBC 3.9 - 10.3 K/uL 4.8 4.7 4.7  Hemoglobin 11.6 - 15.9 g/dL 11.9 11.7 12.2  Hematocrit 34.8 - 46.6 % 34.2(L) 34.6(L) 35.7  Platelets 145 - 400 K/uL 156 140(L) 178   CMP Latest Ref Rng & Units 05/03/2017 04/12/2017 03/22/2017  Glucose 70 - 140 mg/dL 78 83 81  BUN 7 - 26 mg/dL _0 Creatinine 0.60 - 1.10 mg/dL 0.86 0.83 0.80  Sodium 136 - 145 mmol/L 140 141 142  Potassium 3.5 - 5.1 mmol/L 3.5 3.3(L) 3.6  Chloride 98 - 109 mmol/L 109 105 108  CO2 22 - 29 mmol/L _1 Calcium 8.4 - 10.4 mg/dL 9.2 9.2 9.4  Total Protein 6.4 - 8.3 g/dL 6.7 6.6 7.2  Total Bilirubin 0.2 - 1.2 mg/dL 1.7(H) 1.9(H) 1.7(H)  Alkaline Phos 40 - 150 U/L 61 69 82  AST 5 - 34 U/L _2 ALT 0 - 55 U/L _3 PROCEDURES:  Echo 04/22/2017 LV EF: 55% -   60%  ------------------------------------------------------------------- Indications:      Breast cancer (C50.911). Global strain performed on Epiq 7 firmware 2.0.2.  ------------------------------------------------------------------- History:   Risk factors:  Breast cancer. Breast reconstruction. Anemia.  ------------------------------------------------------------------- Study Conclusions  - Left ventricle: The cavity size was normal. Systolic function was   normal. The estimated ejection fraction was in the range of 55%   to 60%. Wall motion was normal; there were no regional wall   motion abnormalities. Doppler parameters are consistent with   abnormal left ventricular relaxation (grade  1 diastolic   dysfunction). Doppler parameters are consistent with   indeterminate ventricular filling pressure. - Aortic valve: Transvalvular velocity was within the normal range.   There was no stenosis. There was no regurgitation. - Mitral valve: Transvalvular velocity was within the normal range.   There was no evidence for stenosis. There was trivial   regurgitation. - Right ventricle: The cavity size was normal. Wall thickness was   normal. Systolic function was normal. - Atrial septum: No defect or patent foramen ovale was identified. - Tricuspid valve: There was mild regurgitation. - Pulmonary arteries: Systolic pressure was within the normal   range. PA peak pressure: 24 mm Hg (S). - Global longitudinal strain -16.7%  (mildly abnormal). Slight   change from 12/2016.  RADIOGRAPHIC STUDIES:  PET Scan 04/30/17 IMPRESSION: 1. Primarily increase in hypermetabolism of osseous metastasis. An isolated right acetabular lesion is decreased in hypermetabolism since the prior exam. 2. No evidence of hypermetabolic soft tissue metastasis. 3. Similar non FDG avid right-sided thyroid nodule, favoring a benign etiology. Recommend attention on follow-up.  PET SCAN: 01/27/17 IMPRESSION: 1. Mixed response to osseous metastasis. Some hypermetabolic lesions are new and increasingly hypermetabolic, while another index lesion is decreasingly hypermetabolic. 2. No evidence of hypermetabolic soft tissue metastasis.  CT A/P IMPRESSION: 10/28/16 No acute process demonstrated in the abdomen or pelvis. No evidence of bowel obstruction or inflammation.  CT CAP and Bone Scan: 10/05/16  Bones: left intramedullary rod and left femoral head neck screw in place. In the proximal diaphysis of the left femur there is cortical thickening and irregularity. No lytic or blastic bone lesions. No fracture or vertebral endplate destruction.  No definite evidence of recurrent disease in the chest, abdomen, or pelvis.  PET 05/20/2016 IMPRESSION: 1. There multiple metastatic foci in the bony pelvis, including some new abnormal foci compare to the prior PET-CT, compatible with mildly progressive bony metastatic disease. Also, a left T11 vertebral hypermetabolic metastatic lesion is observed, new compared to the prior exam. 2. No active extraosseous malignancy is currently identified. 3. Right anterior fourth rib healing fracture, appears benign.  NM Bone Scan 05/01/2016 IMPRESSION: 1. Small focal uptake involving the anterior rib ends of the left second and right fourth ribs, new since the prior bone scan. The location of this uptake is more suggestive of a traumatic or inflammatory etiology as opposed to metastatic disease. No other evidence of  metastatic disease. 2. There are other areas of stable uptake which are most likely degenerative/reactive, including right shoulder and cervical spine uptake and uptake along the right femur adjacent to the ORIF Hardware.  CT CAP 05/01/2016 IMPRESSION: 1. Stable exam.  No new or progressive disease identified. 2. No evidence for residual or recurrent adenopathy within the chest. 3. Stable bone metastasis.   I have personally reviewed the radiological images as listed and agreed with the findings in the report. Nm Pet Image Restag (ps) Skull Base To Thigh  Result Date: 05/01/2017 CLINICAL DATA:  Subsequent treatment strategy for breast cancer with bone metastasis. Evaluate response to therapy. EXAM: NUCLEAR MEDICINE PET SKULL BASE TO THIGH TECHNIQUE: 6.3 mCi F-18 FDG was injected intravenously. Full-ring PET imaging was performed from the skull base to thigh after the radiotracer. CT data was obtained and used for attenuation correction and anatomic localization. Fasting blood glucose: 83 mg/dl COMPARISON:  01/27/2017 FINDINGS: Mediastinal blood pool activity: SUV max 2.1 NECK: Symmetric submandibular gland and palatine tonsil hypermetabolism is favored to be physiologic. No cervical nodal hypermetabolism. Incidental CT  findings: No cervical adenopathy. CHEST: No pulmonary parenchymal or thoracic nodal hypermetabolism. Incidental CT findings: Subtle right thyroid nodule measures 1.3 cm and is not hypermetabolic on image 59/9. This is similar. Left-sided Port-A-Cath terminates at the high right atrium. Right mastectomy and breast reconstruction. Small right axillary nodes without hypermetabolism. Similar. ABDOMEN/PELVIS: No abdominopelvic parenchymal or nodal hypermetabolism. Incidental CT findings: Cholecystectomy.  Normal adrenal glands. SKELETON: Right upper extremity muscular hypermetabolism is without well-defined CT correlate and may be due to motion after radiopharmaceutical injection, recent  trauma, or even minimal tracer extravasation. This measures a S.U.V. max of 4.9, including on approximately image 27/4. Multifocal hypermetabolic osseous metastasis. Index right sacral lesion measures a S.U.V. max of 13.8 versus a S.U.V. max of 7.3 on the prior. Left iliac index lesion measures a S.U.V. max of 18.6 today versus a S.U.V. max of 3.8 on the prior. Index right acetabular lesion measures a S.U.V. max of 14.9 today versus a S.U.V. max of 13.2. Incidental CT findings: Left proximal femoral fixation. IMPRESSION: 1. Primarily increase in hypermetabolism of osseous metastasis. An isolated right acetabular lesion is decreased in hypermetabolism since the prior exam. 2. No evidence of hypermetabolic soft tissue metastasis. 3. Similar non FDG avid right-sided thyroid nodule, favoring a benign etiology. Recommend attention on follow-up. Electronically Signed   By: Abigail Miyamoto M.D.   On: 05/01/2017 16:07     ASSESSMENT & PLAN:  Mrs. Polasek is a 52 y.o. postmenopause female with:  1. 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 -Restaging PET 12-25-15 markedly improved of bone metastasis after initial systemic chemo  -she was switched to letrozole and maintenance Herceptin and the perjeta on 01-16-16.  -Did not tolerate SQ xgeva and it was changed to zometa  -Echocardiogram 01-27-16 showed normal EF, will continue monitoring every 3 months when she is on herceptin -I reviewed her CT scan and bone scan from 05/01/2016, which showed stable disease overall, a few new uptake of anterior ribs on bone scan are indeterminate. -She has developed worsening left hip pain, and more fatigued lately. I have clinical concern of disease progression. -I previously  discussed her restaging PET scan from 05/20/2016. Unfortunately it showed mild disease progression of her bone  metastasis  -I have changed her treatment to second line Kadcyla and letrozole, she is tolerating very well. She declined other chemo.  -Labs reviewed, her bilirubin is slightly elevated but stable, adequate to continue treatment. 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  -She moved to [facility] for 3 months, back to [place] now  -We received records from [facility] in [place], [place]. She underwent CT CAP w contrast on 10/02/16 and bone scan on 09/21/16. CT shows no evidence of recurrent disease in the chest, abdomen, or pelvis; Bone scan indicates no lytic or blastic bone lesions.  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  No other visceral organ metastasis. She is clinically doing well.  -Kadcyla was switched to Exemestane, oral HER2 antibody treatment lapatinib and herceptin.  -She had severe side effects of diarrhea, depression and  low appetite with lapatinib on full dose of 1500 mg after 1 week and she stopped. I recommended her to restart with low dose, and she can only tolerate low dose 500 mg of lapatinib, which she has been on -Her PET Scan from 04/30/17 revealed a primarily increase in hypermetabolism of osseous metastasis. There are no new lesions.  I reviewed the images and compared to the previous images in person with patient and her husband. I suspect her progression is due to her not being able to fully tolerate lapatinib -I recommend that we change her treatment. I discussed options with clinical trial for the new Her2 antibody conjugate Trastuzumab deruxtecan, the phase 3 clinical trial has been open, and the closest site is in [place] and [employer] . She is not  interested in going to [place] at this time. -I recommend switch eczema sting to intravenous or oral chemotherapy, and continue Herceptin.  However patient is very reluctant to take chemo.  I recommend her to consider adding CDK4/6 inhibitor Verzenio to her exemestane, and continue herceptin at this time. CDK4/6 inhibitor has been tested in HR+/HER2+ metastatic breast cancer, and showed some positive results but it's not approved in HER2+ disease.  I discussed the potential side effects, which includes but not limited to diarrhea, fatigue, cytopenias especially neutropenia, the risk of infection, abnormal liver function, etc., she voiced good understanding and agrees to proceed. -We again discussed the goal of therapy is palliative, to prolong her life and improve her quality of life. -Plan to repeat scan in 2-3 months, if she has further disease progression on next scan, I will switch her to Xeloda, with or without Neratinib  -I called our pharmacist Denyse Amass today and she will chack to se if we have any free samples of Verzenio.  -Labs today (05/03/17) bilirubin has decreased to 1.7, was 1.9 3 weeks ago. Otherwise her labs are WNL. Proceed with Herceptin today  - F/u in 3 weeks    2. History of MRSA skin infection scalp, boil at low back scalp and upper lip -I prescribed doxycycline on 04/24/16 for 7 days  - Resolved now.  3.congenital deafness -needs sign language interpreter for all visits.  -Husband also deaf.  4. premature surgical menopause -post BSO 201fr pelvic pain and ovarian cysts, likely was also beneficial from standpoint of the breast cancer. -hysterectomy.   5. Intermittent anemia  -mild, possible related to prior chemo -She is asymptomatic, we'll continue close monitoring  6. Hypokalemia  -K 3.1 on 03/05/16, she is on KCL 254m daily, increased to 404mdaily for a week. -K 3.2 on 04/03/16. The patient is on KCl 72m48maily, increased to 40me70mily for a week and then take 1  pill a day as normal. -K 3.3 on 07/16/16, continue KCL 72meq62mly  -received IV fluids in 11/25/16  -She does not tolerate liquid potassium so she will continue with tablet form. She will conitnues 20 mEq daily  -potassium is 3.3  today 04/12/17, I recommend her to increase to 20 mg (2 10meq 65m) daily  7. Goal of care discussion  -We previously discussed the incurable nature of her cancer, and the overall poor prognosis, especially if she does not have good response to chemotherapy or progress on chemo.  -The patient understands the goal of care is palliative. -she is full code for now   8. Bilateral Leg Pain -she initially presented with worsening left leg and hip pain secondary to bone mets, she had surgery before,  this was much improved after her prior chemo - The patient previously took Hydrocodone for this pain, which helped. - I refilled Hydrocodone previously on 5/3. She will take 1 tablet every 6 hours as needed for pain. -Pain has recurred. Recent PET scan showed minimum uptake, unlikely related to bone mets progression  - I previously encouraged her to call Dr. [name] about the pain in her left leg. She has followed-up  With him.  -She requests to have bed for all infusions  -She has had bilateral thigh pain, possible related to her pelvic bone metastasis, overall manageable, she is not taking Norco much   9. Insomnia -I give her Ambien 84m, but it did not work well.  -continue ativan 1 mg to take at night as needed  -mirtazapine did not work, she also tried trazodone from PCP which gave her a rash. She stopped taking it.   10.  Hyperbilirubinemia -She has developed mild hyperbilirubinemia 3 weeks ago, TBIL 1.7-1.9, okay to continue Herceptin, lapatinib and exemestane without dose adjustment for now -Related to lapatinib, if she has persistent elevated bilirubin, and next PET scan is negative for liver metastasis, I will stop her lapatinib -continue monitoring   Plan:   -PET  Scan reviewed and discussed with pt today, unfortunately it shows disease progression of her osseous metastasis  -discontinue lapatinib due to disease progression -Continue Exemestane and Herceptin  -Start Verzenio 150 mg daily, prescription sent out today  -Lab and f/u in 3 weeks   Pt had many questions. All questions were answered. The patient knows to call the clinic with any problems, questions or concerns.  I spent 30 minutes counseling the patient face to face. The total time spent in the appointment was 40 minutes and more than 50% was on counseling.  This document serves as a record of services personally performed by YTruitt Merle MD. It was created on her behalf by DTheresia Bough a trained medical scribe. The creation of this record is based on the scribe's personal observations and the provider's statements to them.   I have reviewed the above documentation for accuracy and completeness, and I agree with the above.    YTruitt Merle MD 05/03/2017 12:22 PM

## 2017-05-03 ENCOUNTER — Inpatient Hospital Stay (HOSPITAL_BASED_OUTPATIENT_CLINIC_OR_DEPARTMENT_OTHER): Payer: Medicaid Other | Admitting: Hematology

## 2017-05-03 ENCOUNTER — Inpatient Hospital Stay: Payer: Medicaid Other

## 2017-05-03 ENCOUNTER — Telehealth: Payer: Self-pay | Admitting: Hematology

## 2017-05-03 ENCOUNTER — Encounter: Payer: Self-pay | Admitting: Hematology

## 2017-05-03 VITALS — BP 122/62 | HR 59 | Temp 97.8°F | Resp 18 | Ht 67.0 in | Wt 125.6 lb

## 2017-05-03 DIAGNOSIS — C7951 Secondary malignant neoplasm of bone: Secondary | ICD-10-CM

## 2017-05-03 DIAGNOSIS — C50919 Malignant neoplasm of unspecified site of unspecified female breast: Secondary | ICD-10-CM

## 2017-05-03 DIAGNOSIS — Z9221 Personal history of antineoplastic chemotherapy: Secondary | ICD-10-CM

## 2017-05-03 DIAGNOSIS — Z170 Estrogen receptor positive status [ER+]: Secondary | ICD-10-CM | POA: Diagnosis not present

## 2017-05-03 DIAGNOSIS — E876 Hypokalemia: Secondary | ICD-10-CM

## 2017-05-03 DIAGNOSIS — Z79899 Other long term (current) drug therapy: Secondary | ICD-10-CM

## 2017-05-03 DIAGNOSIS — C50911 Malignant neoplasm of unspecified site of right female breast: Secondary | ICD-10-CM

## 2017-05-03 DIAGNOSIS — Z79811 Long term (current) use of aromatase inhibitors: Secondary | ICD-10-CM

## 2017-05-03 DIAGNOSIS — M25552 Pain in left hip: Secondary | ICD-10-CM | POA: Diagnosis not present

## 2017-05-03 DIAGNOSIS — Z90710 Acquired absence of both cervix and uterus: Secondary | ICD-10-CM

## 2017-05-03 DIAGNOSIS — Z8614 Personal history of Methicillin resistant Staphylococcus aureus infection: Secondary | ICD-10-CM | POA: Diagnosis not present

## 2017-05-03 DIAGNOSIS — D649 Anemia, unspecified: Secondary | ICD-10-CM | POA: Diagnosis not present

## 2017-05-03 DIAGNOSIS — C799 Secondary malignant neoplasm of unspecified site: Principal | ICD-10-CM

## 2017-05-03 DIAGNOSIS — G893 Neoplasm related pain (acute) (chronic): Secondary | ICD-10-CM

## 2017-05-03 DIAGNOSIS — Z5112 Encounter for antineoplastic immunotherapy: Secondary | ICD-10-CM | POA: Diagnosis not present

## 2017-05-03 DIAGNOSIS — G4700 Insomnia, unspecified: Secondary | ICD-10-CM

## 2017-05-03 DIAGNOSIS — Z95828 Presence of other vascular implants and grafts: Secondary | ICD-10-CM

## 2017-05-03 LAB — COMPREHENSIVE METABOLIC PANEL
ALBUMIN: 3.6 g/dL (ref 3.5–5.0)
ALT: 14 U/L (ref 0–55)
ANION GAP: 8 (ref 3–11)
AST: 16 U/L (ref 5–34)
Alkaline Phosphatase: 61 U/L (ref 40–150)
BUN: 11 mg/dL (ref 7–26)
CALCIUM: 9.2 mg/dL (ref 8.4–10.4)
CHLORIDE: 109 mmol/L (ref 98–109)
CO2: 23 mmol/L (ref 22–29)
Creatinine, Ser: 0.86 mg/dL (ref 0.60–1.10)
GFR calc non Af Amer: >60 mL/min (ref >60)
GLUCOSE: 78 mg/dL (ref 70–140)
Potassium: 3.5 mmol/L (ref 3.5–5.1)
SODIUM: 140 mmol/L (ref 136–145)
Total Bilirubin: 1.7 mg/dL — ABNORMAL HIGH (ref 0.2–1.2)
Total Protein: 6.7 g/dL (ref 6.4–8.3)

## 2017-05-03 LAB — CBC WITH DIFFERENTIAL/PLATELET
Basophils Absolute: 0 10*3/uL (ref 0.0–0.1)
Basophils Relative: 0 %
Eosinophils Absolute: 0.1 10*3/uL (ref 0.0–0.5)
Eosinophils Relative: 2 %
HCT: 34.2 % — ABNORMAL LOW (ref 34.8–46.6)
HEMOGLOBIN: 11.9 g/dL (ref 11.6–15.9)
LYMPHS ABS: 1.7 10*3/uL (ref 0.9–3.3)
Lymphocytes Relative: 36 %
MCH: 30.1 pg (ref 25.1–34.0)
MCHC: 34.6 g/dL (ref 31.5–36.0)
MCV: 86.9 fL (ref 79.5–101.0)
MONO ABS: 0.3 10*3/uL (ref 0.1–0.9)
MONOS PCT: 7 %
NEUTROS ABS: 2.6 10*3/uL (ref 1.5–6.5)
Neutrophils Relative %: 55 %
Platelets: 156 10*3/uL (ref 145–400)
RBC: 3.94 MIL/uL (ref 3.70–5.45)
RDW: 13.5 % (ref 11.2–14.5)
WBC: 4.8 10*3/uL (ref 3.9–10.3)

## 2017-05-03 MED ORDER — SODIUM CHLORIDE 0.9% FLUSH
10.0000 mL | INTRAVENOUS | Status: DC | PRN
  Administered 2017-05-03: 10 mL
  Filled 2017-05-03: qty 10

## 2017-05-03 MED ORDER — SODIUM CHLORIDE 0.9 % IJ SOLN
10.0000 mL | INTRAMUSCULAR | Status: DC | PRN
  Administered 2017-05-03: 10 mL via INTRAVENOUS
  Filled 2017-05-03: qty 10

## 2017-05-03 MED ORDER — LORATADINE 10 MG PO TABS
10.0000 mg | ORAL_TABLET | Freq: Every day | ORAL | Status: DC
  Administered 2017-05-03: 10 mg via ORAL

## 2017-05-03 MED ORDER — ACETAMINOPHEN 325 MG PO TABS
ORAL_TABLET | ORAL | Status: AC
  Filled 2017-05-03: qty 2

## 2017-05-03 MED ORDER — ACETAMINOPHEN 325 MG PO TABS
650.0000 mg | ORAL_TABLET | Freq: Once | ORAL | Status: AC
  Administered 2017-05-03: 650 mg via ORAL

## 2017-05-03 MED ORDER — SODIUM CHLORIDE 0.9 % IV SOLN
Freq: Once | INTRAVENOUS | Status: AC
  Administered 2017-05-03: 11:00:00 via INTRAVENOUS

## 2017-05-03 MED ORDER — LORATADINE 10 MG PO TABS
ORAL_TABLET | ORAL | Status: AC
  Filled 2017-05-03: qty 1

## 2017-05-03 MED ORDER — EXEMESTANE 25 MG PO TABS: 25.0000 mg | ORAL_TABLET | Freq: Every day | ORAL | 3 refills | Status: DC

## 2017-05-03 MED ORDER — ABEMACICLIB 150 MG PO TABS: 150.0000 mg | ORAL_TABLET | Freq: Two times a day (BID) | ORAL | 0 refills | Status: DC

## 2017-05-03 MED ORDER — HEPARIN SOD (PORK) LOCK FLUSH 100 UNIT/ML IV SOLN
500.0000 [IU] | Freq: Once | INTRAVENOUS | Status: AC | PRN
  Administered 2017-05-03: 500 [IU]
  Filled 2017-05-03: qty 5

## 2017-05-03 MED ORDER — TRASTUZUMAB CHEMO 150 MG IV SOLR
6.0000 mg/kg | Freq: Once | INTRAVENOUS | Status: AC
  Administered 2017-05-03: 357 mg via INTRAVENOUS
  Filled 2017-05-03: qty 17

## 2017-05-03 NOTE — Patient Instructions (Signed)
[facility] Discharge Instructions for Patients Receiving Chemotherapy Today you received the following chemotherapy agents:  Herceptin To help prevent nausea and vomiting after your treatment, we encourage you to take your nausea medication as prescribed.   If you develop nausea and vomiting that is not controlled by your nausea medication, call the clinic.   BELOW ARE SYMPTOMS THAT SHOULD BE REPORTED IMMEDIATELY:  *FEVER GREATER THAN 100.5 F  *CHILLS WITH OR WITHOUT FEVER  NAUSEA AND VOMITING THAT IS NOT CONTROLLED WITH YOUR NAUSEA MEDICATION  *UNUSUAL SHORTNESS OF BREATH  *UNUSUAL BRUISING OR BLEEDING  TENDERNESS IN MOUTH AND THROAT WITH OR WITHOUT PRESENCE OF ULCERS  *URINARY PROBLEMS  *BOWEL PROBLEMS  UNUSUAL RASH Items with * indicate a potential emergency and should be followed up as soon as possible.  Feel free to call the clinic should you have any questions or concerns. The clinic phone number is [phone].  Please show the CHEMO ALERT CARD at check-in to the Emergency Department and triage nurse.   

## 2017-05-03 NOTE — Progress Notes (Signed)
Per Dr. Burr Medico okay to treat with Bilirubin 1.7  Informed Tiffany RN okay to proceed with treatment.

## 2017-05-03 NOTE — Telephone Encounter (Signed)
Scheduled appt per 4/22 los - patient to get an updated schedule in the treatment area.

## 2017-05-04 LAB — CANCER ANTIGEN 27.29: CA 27.29: 29.3 U/mL (ref 0.0–38.6)

## 2017-05-05 ENCOUNTER — Telehealth: Payer: Self-pay | Admitting: Pharmacist

## 2017-05-05 DIAGNOSIS — C50919 Malignant neoplasm of unspecified site of unspecified female breast: Secondary | ICD-10-CM

## 2017-05-05 MED ORDER — ABEMACICLIB 100 MG PO TABS: 100.0000 mg | ORAL_TABLET | Freq: Two times a day (BID) | ORAL | 0 refills | Status: DC

## 2017-05-05 MED ORDER — ABEMACICLIB 150 MG PO TABS: 150.0000 mg | ORAL_TABLET | Freq: Two times a day (BID) | ORAL | 0 refills | Status: DC

## 2017-05-05 NOTE — Telephone Encounter (Signed)
Oral Oncology Pharmacist Encounter  Received new prescription for Verzenio (abemaciclib) for the treatment of progressive, metastatic, hormone receptor positive breast cancer in conjunction with exemestane, planned duration until disease progression or unacceptable toxicity.  Patient with recent progression on therapy with Tykerb, patient was not able to tolerate full dose due to diarrhea  Labs from 05/03/2017 assessed, okay for treatment. Noted Tbili = 1.7 (~1.4 x ULN), no dose adjustment per manufacturer for mild or moderate hepatic impairment  Per discussion with MD patient will be initiated on Verzenio 100 mg twice daily x14 days If tolerated without issue, will be increased to target dosing of Verzenio 150 mg twice daily  Current medication list in Epic reviewed, no DDIs with Verzenio identified.  Prescription has been e-scribed to the [facility] for benefits analysis and approval.  Oral Oncology Clinic will continue to follow for insurance authorization, copayment issues, initial counseling and start date.  Johny Drilling, PharmD, BCPS, BCOP 05/05/2017 9:19 AM Oral Oncology Clinic [phone]

## 2017-05-05 NOTE — Telephone Encounter (Signed)
Oral Chemotherapy Pharmacist Encounter   Attempted to reach patient to provide update and offer for initial counseling on oral medication: Verzenio.   No answer. Left VM with interpreter service for patient to call back with any questions or issues.   Thank you,  Johny Drilling, PharmD, BCPS, BCOP 05/05/2017   9:49 AM Oral Oncology Clinic [phone]

## 2017-05-06 MED FILL — VERZENIO 100 MG TAB: 100 | 14 days supply | Qty: 28 | Fill #0

## 2017-05-06 NOTE — Telephone Encounter (Signed)
Oral Chemotherapy Pharmacist Encounter   I spoke with patient over the phone with assistance of sign language interpreter for overview of: Verzenio.   Pt is doing well. Counseled patient on administration, dosing, side effects, monitoring, drug-food interactions, safe handling, storage, and disposal.  Patient will be initiated on Verzenio on a small dose titration schedule.  Patient will take Verzenio 100mg  tablets, 1 tablet by mouth twice daily without regard to food. Patient will continue on this dosing schedule for 2 weeks.  If tolerated, patient will increased to Verzenio 150mg  tablets, 1 tablet by mouth twice daily without regard to food. This is the target dosing of Verzenio.  Patient knows to avoid grapefruit and grapefruit juice.  Patient is taking exemestane 25 mg tablets once daily with breakfast.  Verzenio start date: 05/08/2017  Side effects include but not limited to: diarrhea, fatigue, nausea, abdominal pain, hair loss, decreased blood counts, and increased liver function tests, and joint pains.  Reviewed with patient importance of keeping a medication schedule and plan for any missed doses.  [name] voiced understanding and appreciation.   Patient with multiple questions about available treatment options, and their differences to her previous therapies.  All questions answered.  Medication reconciliation performed and medication/allergy list updated.  Verzenio 100 mg tablets will shift from the [facility] today (05/06/2017) for delivery to patient's house tomorrow. She will start her Verzenio on Saturday morning. Patient with Medicaid prescription insurance coverage, copayment is $3 per fill. This is affordable to patient at this time.  I will call the patient on May 7th for adherence and toxicity check. At that time we will coordinate with the pharmacy whether to send more of the 100 mg tablets or increased to the 150 mg  tablets.  Confirmed office visits on May 13 with patient.  Patient knows to call the office with questions or concerns. Oral Oncology Clinic will continue to follow.  Thank you,  Johny Drilling, PharmD, BCPS, BCOP 05/06/2017   2:41 PM Oral Oncology Clinic [phone]

## 2017-05-18 ENCOUNTER — Telehealth: Payer: Self-pay | Admitting: Pharmacist

## 2017-05-18 DIAGNOSIS — C50919 Malignant neoplasm of unspecified site of unspecified female breast: Secondary | ICD-10-CM

## 2017-05-18 MED ORDER — ABEMACICLIB 100 MG PO TABS: 100.0000 mg | ORAL_TABLET | Freq: Two times a day (BID) | ORAL | 0 refills | Status: DC

## 2017-05-18 MED FILL — VERZENIO 100 MG TAB: 100 | 14 days supply | Qty: 28 | Fill #0

## 2017-05-18 NOTE — Telephone Encounter (Signed)
Oral Chemotherapy Pharmacist Encounter  Follow-Up Form  Spoke with patient today to follow up regarding patient's oral chemotherapy medication: Verzenio (abemaciclib) for the treatment of progressive, metastatic, hormone receptor positive breast cancer in conjunction with exemestane, planned duration until disease progression or unacceptable toxicity.  Original Start date of oral chemotherapy: 05/08/17  Pt is doing well today  Pt reports 0 tablets/doses of Verzenio 100mg  tablets, 1 tablets by mouth twice daily, with or without food, missed in the last 2 weeks.   Pt reports the following side effects: taste changes, decreased appetite, weight loss, and skin rash on forehead and cheeks.  Patient denies diarrhea or any bowel habit changes  She is agreeable to continuing Verzenio for now, without dose increase. Further continuation and dose increase to 150mg  BID (target dose) to be discussed with Dr. Burr Medico at [facility] on 05/24/17  2 week supply Rx of Verzenio 100mg  BID e-scribed to [facility]. Patient requests it ship to her home. Prescription should ship today (05/18/17) for delivery to patient;'s home tomorrow.  Patient knows to call the office with questions or concerns. Oral Oncology Clinic will continue to follow.  Thank you,  Johny Drilling, PharmD, BCPS, BCOP 05/18/2017 3:23 PM Oral Oncology Clinic [phone]

## 2017-05-21 NOTE — Progress Notes (Signed)
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 She had mastectomy with the 7 axillary node evaluation, 4 cycles of adriamycin/ cytoxan followed by taxotere, then five years of tamoxifen thru June 2005 (no herceptin in 1999). She had bilateral oophorectomy in May 2005. She was briefly on aromatase inhibitor after tamoxifen, but discontinued this herself due to poor tolerance.      04/04/2015 Genetic Testing    Negative genetic testing on the breast/ovarian cancer pnael.  The Breast/Ovarian gene panel offered by [facility] includes sequencing and rearrangement analysis for the following 20 genes:  ATM, BARD1, BRCA1, BRCA2, BRIP1, CDH1, CHEK2, EPCAM, FANCC, MLH1, MSH2, MSH6, NBN, PALB2, PMS2, PTEN, RAD51C, RAD51D, TP53, and XRCC2.      06/26/2015 Pathology Results    Initial Path Report Bone, curettage, Left femur met, intramedullary subtroch tissue - METASTATIC ADENOCARCINOMA.  Estrogen Receptor: 95%, POSITIVE, STRONG STAINING INTENSITY Progesterone Receptor: 2%, POSITIVE, STRONG  HER2 (+), IHC 3+      06/26/2015 Surgery    Biopsy of metastatic tissue and stabilization with Affixus trochanteric nail, proximal and distal interlock of left femur by Dr. [name]       06/28/2015 Imaging    CT Chest w/ Contrast 1. Extensive right internal mammary chain lymphadenopathy consistent with metastatic breast cancer. 2. Lytic metastases involving the posterior elements at  T1 with probable nondisplaced pathologic fracture of the spinous process. 3. No other evidence of thoracic metastatic disease. 4. Trace bilateral pleural effusions with associated bibasilar atelectasis. 5. Indeterminate right thyroid nodule, not previously imaged. This could be evaluated with thyroid ultrasound as clinically warranted.      07/01/2015 Imaging    Bone Scan 1. Increased activity noted throughout the left femur. Although metastatic disease cannot be excluded, these changes are most likely secondary to prior surgery. 2. No other focal abnormalities identified to suggest metastatic disease.      07/12/2015 - 07/26/2015 Radiation Therapy    Left femur, 30 Gy in 10 fractions by Dr. Sondra Come       07/14/2015 Initial Diagnosis    [name], right ([place])      07/19/2015 Imaging    Initial PET Scan 1. Severe multifocal osseous metastatic disease, much greater than anticipated based on prior imaging. 2. Multifocal nodal metastases involving the right internal mammary, prevascular and subpectoral lymph nodes. No axillary pulmonary involvement identified. 3. No distant extra osseous metastases.      07/30/2015 - 11/29/2015 Chemotherapy    Docetaxel, Herceptin and Perjeta every 3 weeks X6 cycles, pt tolerated moderately well, restaging scan showed excellent response       09/18/2015 Imaging    CT Head w/wo Contrast 1. No acute intracranial abnormality or significant interval change. 2. Stable minimal periventricular white matter hypoattenuation on the right. This may reflect a remote ischemic injury. 3. No evidence for metastatic disease to the brain. 4. No focal soft tissue lesion to explain the patient's tenderness.  10/30/2015 Imaging    CT Abdomen Pelvis w/ Contrast 1. Few mildly prominent fluid-filled loops of small bowel scattered throughout the abdomen, with intraluminal fluid density within the distal colon. Given the provided history, findings  are suggestive of acute enteritis/diarrheal illness. 2. No other acute intra-abdominal or pelvic process identified. 3. Widespread osseous metastatic disease, better evaluated on most recent PET-CT from 07/19/2015. No pathologic fracture or other complication. 4. 11 mm hypodensity within the left kidney. This lesion measures intermediate density, and is indeterminate. While this lesion is similar in size relative to recent studies, this is increased in size relative to prior study from 2012. Further evaluation with dedicated renal mass protocol CT and/or MRI is recommended for complete characterization.      12/25/2015 Imaging    Restaging PET Scan 1. Markedly improved skeletal activity, with only several faint foci of residual accentuated metabolic activity, but resolution of the vast majority of the previously extensive osseous metastatic disease. 2. Resolution of the prior right internal mammary and right prevascular lymph nodes. 3. Residual subcutaneous edema and edema along fascia planes in the left upper thigh. This remains somewhat more than I would expect for placement of an IM nail 6 weeks ago. If there is leg swelling further down, consider Doppler venous ultrasound to rule out left lower extremity DVT. I do not perceive an obvious difference in density between the pelvic and common femoral veins on the noncontrast CT data. 4. Chronic right maxillary and right sphenoid sinusitis.      12/2015 -  Chemotherapy    Maintenance Herceptin and pejeta every 3 weeks      01/16/2016 -  Anti-estrogen oral therapy    Letrozole 2.5 mg daily      03/01/2016 Miscellaneous    Patient presented to ED following a fall; she reports she landed on her back and is now experiencing back pain. The patient was evaluated and discharge home same day with pain control.      05/01/2016 Imaging    CT CAP IMPRESSION: 1. Stable exam.  No new or progressive disease identified. 2. No evidence for residual or  recurrent adenopathy within the chest. 3. Stable bone metastasis.      05/01/2016 Imaging    BONE SCAN IMPRESSION: 1. Small focal uptake involving the anterior rib ends of the left second and right fourth ribs, new since the prior bone scan. The location of this uptake is more suggestive of a traumatic or inflammatory etiology as opposed to metastatic disease. No other evidence of metastatic disease. 2. There are other areas of stable uptake which are most likely degenerative/reactive, including right shoulder and cervical spine uptake and uptake along the right femur adjacent to the ORIF hardware.      05/20/2016 Imaging    NM PET Skull to Thigh IMPRESSION: 1. There multiple metastatic foci in the bony pelvis, including some new abnormal foci compare to the prior PET-CT, compatible with mildly progressive bony metastatic disease. Also, a left T11 vertebral hypermetabolic metastatic lesion is observed, new compared to the prior exam. 2. No active extraosseous malignancy is currently identified. 3. Right anterior fourth rib healing fracture, appears be      06/04/2016 -  Chemotherapy    Second line chemotherapy Kadcyla every 3 weeks started on 06/04/16       10/05/2016 Imaging    CT CAP and Bone Scan:  Bones: left intramedullary rod and left femoral head neck screw in place. In the proximal diaphysis of the left femur  there is cortical thickening and irregularity. No lytic or blastic bone lesions. No fracture or vertebral endplate destruction.   No definite evidence of recurrent disease in the chest, abdomen, or pelvis.      10/28/2016 Imaging    CT A/P IMPRESSION: No acute process demonstrated in the abdomen or pelvis. No evidence of bowel obstruction or inflammation.      11/04/2016 Imaging    DG foot complete left: IMPRESSION: No fracture or dislocation.  No soft tissue abnormality      11/23/2016 Imaging    CT RIGHT FEMUR IMPRESSION: Negative CT scan of the  right thigh.  No visible metastatic disease.  CT LEFT FEMUR IMPRESSION: 1. Postsurgical changes related to prior cephalomedullary rod fixation of the left femur with linear lucency along the anterolateral proximal femoral cortex at level of the lesser trochanter, suspicious for nondisplaced fracture. 2. Slightly permeative appearance of the proximal left femoral cortex at the site of known prior osseous metastases is largely unchanged. 3. Unchanged patchy lucency and sclerosis along the anterior acetabulum, which appears to correspond with an area of faint uptake on the prior PET-CT, suspicious for osseous metastasis. These results will be called to the ordering clinician or representative by the Radiologist Assistant, and communication documented in the PACS or zVision Dashboard.        01/27/2017 Imaging    IMPRESSION: 1. Mixed response to osseous metastasis. Some hypermetabolic lesions are new and increasingly hypermetabolic, while another index lesion is decreasingly hypermetabolic. 2. No evidence of hypermetabolic soft tissue metastasis.       04/30/2017 PET scan    IMPRESSION: 1. Primarily increase in hypermetabolism of osseous metastasis. An isolated right acetabular lesion is decreased in hypermetabolism since the prior exam. 2. No evidence of hypermetabolic soft tissue metastasis. 3. Similar non FDG avid right-sided thyroid nodule, favoring a benign etiology. Recommend attention on follow-up.       HISTORY OF PRESENTING ILLNESS (From Dr. Mariana Kaufman note on 02/06/2016):  [name] 52 y.o. 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 She has had an excellent response to 6 cycles of taxotere herceptin perjeta from 07-30-15 thru 11-29-15. She is continuing herceptin and perjeta every 3 weeks. She had extreme local pain with first xgeva injection on 01-16-16 and prefers  resuming previous zometa. She began letrozole ~ 01-16-16, has no complaints about this at all.  Last imaging was PET 12-25-15.  NOTE CTs, plain Xrays and bone scan did NOT show any of the extensively metastatic bone involvement 06-2015; MRI and PET did image disease clearly.  Patient continues to do very well overall. MRSA skin lesions on scalp and face improved quickly with doxycycline starting 01-16-16. She continues to use Dial soap and we have again strongly encouraged good hygiene with other family members at home. She has had the 3 lymphedema PT sessions allowed by insurance, continues to do massage for LLE herself, still has not gotten the ordered compression apparatus for home and will follow up with PT on that. Swelling in LLE has improved with the interventions and does not seem as uncomfortable. She denies any pain. Appetite is excellent, bowels fine, no SOB, good energy, no problems with PAC, no noted changes in left breast. No bleeding. No fever or symptoms of infection. She slipped in tub last week, fell onto right arm fortunately without injury. She was upset by a visitor and shaky then, but no tremors otherwise.  She has two children and 4 grandchildren. She has had genetic testing and it was negative. She lives at home with her husband. She is very active. Her children live close to her.   CURRENT THERAPY:  Exemestane '25mg'$  daily and herceptin every 3 weeks, added Verzenio 150 mg on 05/04/17. Given her poor tolerance with diarrhea and low appetite, will swithc her to [place]. Samples given on 05/24/17.   INTERIM HISTORY:   [name] returns today for follow up and ongoing treatment. She presents to the clinic today with her interpreter and family member. She notes she has started Verzenio on 04/2017 and she has not been able to eat much. She did not get to take her Verzenio last night for the first time due to stress. She tries to eat but she does not want to. She can eat crackers. She  has been drinking fruit water for hydration. She continue to take her exemestane daily. She denies side effects from exemestane. She missed her potassium for 2 days because she forget. Otherwise she takes potassium BID. She notes she was cooking last week and burned her arm from the oven. She asked about neosporin while here.    On review of symptoms, pt taste change, low appetite. Has lost 4 pounds. She notes vomiting 1-2 times, stress related. She notes to having headaches and diarrhea 2-3 times everyday.     MEDICAL HISTORY:  Past Medical History:  Diagnosis Date  . Abnormal Pap smear   . Anxiety   . Breast cancer ([place])   . Cancer ([place]) 1999   breast-s/p mastectomy, chemo, rad  . CIN I (cervical intraepithelial neoplasia I) 2003   by colpo  . Congenital deafness   . Deaf   . Depression   . Radiation 07/12/15-07/26/15   left femur 30 Gy  . S/P bilateral oophorectomy     SURGICAL HISTORY: Past Surgical History:  Procedure Laterality Date  . ABDOMINAL HYSTERECTOMY    . INTRAMEDULLARY (IM) NAIL INTERTROCHANTERIC Left 06/26/2015   Procedure: LEFT BIOMET LONG AFFIXS NAIL;  Surgeon: [name], MD;  Location: [facility];  Service: Orthopedics;  Laterality: Left;  . IR GENERIC HISTORICAL  08/06/2015   IR US GUIDE VASC ACCESS LEFT 08/06/2015 Aletta Edouard, MD [facility]  . IR GENERIC HISTORICAL  08/06/2015   IR FLUORO GUIDE CV LINE LEFT 08/06/2015 Aletta Edouard, MD [facility]  . IR GENERIC HISTORICAL  03/05/2016   IR CV LINE INJECTION 03/05/2016 Markus Daft, MD [facility]  . MASTECTOMY     right breast  . MASTECTOMY    . OVARIAN CYST REMOVAL    . RADIOLOGY WITH ANESTHESIA Left 06/25/2015   Procedure: MRI OF LEFT HIP WITH OR WITHOUT CONTRAST;  Surgeon: Medication Radiologist, MD;  Location: [facility];  Service: Radiology;  Laterality: Left;  DR. MCINTYRE/MRI  . TUBAL LIGATION      SOCIAL HISTORY: Social History   Socioeconomic History  . Marital status: Married    Spouse name: Not  on file  . Number of children: 2  . Years of education: Not on file  . Highest education level: Not on file  Occupational History  . Not on file  Social Needs  . Financial resource strain: Not on file  . Food insecurity:    Worry: Not on file    Inability: Not on file  . Transportation needs:    Medical: Not on file    Non-medical: Not on file  Tobacco Use  .  Smoking status: Never Smoker  . Smokeless tobacco: Never Used  Substance and Sexual Activity  . Alcohol use: No  . Drug use: No  . Sexual activity: Yes    Birth control/protection: Surgical  Lifestyle  . Physical activity:    Days per week: Not on file    Minutes per session: Not on file  . Stress: Not on file  Relationships  . Social connections:    Talks on phone: Not on file    Gets together: Not on file    Attends religious service: Not on file    Active member of club or organization: Not on file    Attends meetings of clubs or organizations: Not on file    Relationship status: Not on file  . Intimate partner violence:    Fear of current or ex partner: Not on file    Emotionally abused: Not on file    Physically abused: Not on file    Forced sexual activity: Not on file  Other Topics Concern  . Not on file  Social History Narrative  . Not on file    FAMILY HISTORY: Family History  Problem Relation Age of Onset  . Hypertension Mother   . Seizures Mother   . Alzheimer's disease Maternal Uncle   . Pancreatitis Maternal Grandmother   . Heart attack Maternal Grandfather     ALLERGIES:  is allergic to chlorhexidine; promethazine hcl; and diphenhydramine hcl.  MEDICATIONS:  Current Outpatient Medications  Medication Sig Dispense Refill  . Cholecalciferol 2000 units CAPS Take 1 capsule by mouth daily.    . diazepam (VALIUM) 5 MG tablet TAKE 1 TABLET BY MOUTH AT BEDTIME AS NEEDED FOR ANXIETY 30 tablet 0  . divalproex (DEPAKOTE) 250 MG DR tablet Take 1 tablet (250 mg total) by mouth at bedtime. 30 tablet  2  . exemestane (AROMASIN) 25 MG tablet Take 1 tablet (25 mg total) by mouth daily after breakfast. 30 tablet 3  . HYDROcodone-acetaminophen (NORCO) 10-325 MG tablet Take 1 tablet by mouth every 6 (six) hours as needed. 45 tablet 0  . HYDROmorphone (DILAUDID) 2 MG tablet Take 1 tablet (2 mg total) by mouth every 4 (four) hours as needed for severe pain. 30 tablet 0  . lidocaine-prilocaine (EMLA) cream Apply 1 application as needed topically. Apply to Porta-Cath 1-2 hours prior to access as directed. 30 g 2  . potassium chloride (K-DUR) 10 MEQ tablet Take 2 tablets (20 mEq total) by mouth daily. 60 tablet 2  . prochlorperazine (COMPAZINE) 10 MG tablet Take 1 tablet (10 mg total) by mouth every 6 (six) hours as needed for nausea or vomiting. 30 tablet 2  . traZODone (DESYREL) 50 MG tablet Take 1 tablet (50 mg total) by mouth at bedtime as needed for sleep. 30 tablet 3  . palbociclib (IBRANCE) 100 MG capsule Take 1 capsule (100 mg total) by mouth daily with lunch. Take whole with food. Take for 21 days on, 7 days off, repeat every 28 days. 21 capsule 0   No current facility-administered medications for this visit.    Facility-Administered Medications Ordered in Other Visits  Medication Dose Route Frequency Provider Last Rate Last Dose  . loratadine (CLARITIN) tablet 10 mg  10 mg Oral Daily Truitt Merle, MD   10 mg at 05/24/17 1155  . sodium chloride 0.9 % 1,000 mL with potassium chloride 10 mEq infusion   Intravenous Continuous Gordy Levan, MD   Stopped at 09/10/15 1653  . sodium chloride 0.9 %  injection 10 mL  10 mL Intravenous PRN Livesay, Lennis P, MD      . sodium chloride flush (NS) 0.9 % injection 10 mL  10 mL Intracatheter PRN Truitt Merle, MD   10 mL at 05/24/17 1318    REVIEW OF SYSTEMS:  Constitutional: Denies fevers, chills or abnormal night sweats (+) low appetite from taste change (+) weight loss  Eyes: Denies blurriness of vision, double vision or watery eyes Ears, nose, mouth,  throat, and face: Denies mucositis or sore throat (+) deaf Respiratory: Denies cough, dyspnea or wheezes Cardiovascular: Denies palpitation, chest discomfort or lower extremity swelling Gastrointestinal:  Denies nausea (+) diarrhea 2-3 times a day (+) stress related emesis, 1 episode  Lymphatics: Denies new lymphadenopathy or easy bruising. MSK: (+) Left leg pain  Behavioral/Psych: (+)mild anxiety, stress All other systems were reviewed with the patient and are negative.  PHYSICAL EXAMINATION: ECOG PERFORMANCE STATUS: 1-2 BP 117/62 (BP Location: Left Arm, Patient Position: Sitting)   Pulse 70   Temp 97.7 F (36.5 C) (Oral)   Resp 18   Ht '5\' 7"'$  (1.702 m)   Wt 121 lb 6.4 oz (55.1 kg)   SpO2 100%   BMI 19.01 kg/m    GENERAL:alert, no distress and comfortable  EYES: normal, conjunctiva are pink and non-injected, sclera clear OROPHARYNX:no exudate, no erythema and lips, buccal mucosa, and tongue normal  NECK: supple, thyroid normal size, non-tender, without nodularity LYMPH:  no palpable lymphadenopathy in the cervical, axillary or inguinal LUNGS: clear to auscultation and percussion with normal breathing effort HEART: regular rate & rhythm and no murmurs and no lower extremity edema ABDOMEN:abdomen soft, non-tender and normal bowel sounds. No organomegaly. Musculoskeletal:no cyanosis of digits and no clubbing  PSYCH: alert & oriented x 3 with fluent speech NEURO: no focal motor/sensory deficits Breasts: Breast inspection showed Right breast is surgically absent status post reconstruction. Palpation of the breasts and axilla revealed no obvious mass that I could appreciate.   LABORATORY DATA:  I have reviewed the data as listed CBC Latest Ref Rng & Units 05/24/2017 05/03/2017 04/12/2017  WBC 3.9 - 10.3 K/uL 4.2 4.8 4.7  Hemoglobin 11.6 - 15.9 g/dL 12.0 11.9 11.7  Hematocrit 34.8 - 46.6 % 34.9 34.2(L) 34.6(L)  Platelets 145 - 400 K/uL 108(L) 156 140(L)   CMP Latest Ref Rng & Units  05/24/2017 05/03/2017 04/12/2017  Glucose 70 - 140 mg/dL 86 78 83  BUN 7 - 26 mg/dL '14 11 11  '$ Creatinine 0.60 - 1.10 mg/dL 1.29(H) 0.86 0.83  Sodium 136 - 145 mmol/L 141 140 141  Potassium 3.5 - 5.1 mmol/L 3.2(L) 3.5 3.3(L)  Chloride 98 - 109 mmol/L 107 109 105  CO2 22 - 29 mmol/L '25 23 28  '$ Calcium 8.4 - 10.4 mg/dL 9.5 9.2 9.2  Total Protein 6.4 - 8.3 g/dL 7.2 6.7 6.6  Total Bilirubin 0.2 - 1.2 mg/dL 1.9(H) 1.7(H) 1.9(H)  Alkaline Phos 40 - 150 U/L 66 61 69  AST 5 - 34 U/L '19 16 19  '$ ALT 0 - 55 U/L '14 14 16   '$ PROCEDURES:  Echo 04/22/2017 LV EF: 55% -   60%  ------------------------------------------------------------------- Indications:      Breast cancer (C50.911). Global strain performed on Epiq 7 firmware 2.0.2.  ------------------------------------------------------------------- History:   Risk factors:  Breast cancer. Breast reconstruction. Anemia.  ------------------------------------------------------------------- Study Conclusions  - Left ventricle: The cavity size was normal. Systolic function was   normal. The estimated ejection fraction was in the range of 55%  to 60%. Wall motion was normal; there were no regional wall   motion abnormalities. Doppler parameters are consistent with   abnormal left ventricular relaxation (grade 1 diastolic   dysfunction). Doppler parameters are consistent with   indeterminate ventricular filling pressure. - Aortic valve: Transvalvular velocity was within the normal range.   There was no stenosis. There was no regurgitation. - Mitral valve: Transvalvular velocity was within the normal range.   There was no evidence for stenosis. There was trivial   regurgitation. - Right ventricle: The cavity size was normal. Wall thickness was   normal. Systolic function was normal. - Atrial septum: No defect or patent foramen ovale was identified. - Tricuspid valve: There was mild regurgitation. - Pulmonary arteries: Systolic pressure was  within the normal   range. PA peak pressure: 24 mm Hg (S). - Global longitudinal strain -16.7% (mildly abnormal). Slight   change from 12/2016.  RADIOGRAPHIC STUDIES:  PET Scan 04/30/17 IMPRESSION: 1. Primarily increase in hypermetabolism of osseous metastasis. An isolated right acetabular lesion is decreased in hypermetabolism since the prior exam. 2. No evidence of hypermetabolic soft tissue metastasis. 3. Similar non FDG avid right-sided thyroid nodule, favoring a benign etiology. Recommend attention on follow-up.  PET SCAN: 01/27/17 IMPRESSION: 1. Mixed response to osseous metastasis. Some hypermetabolic lesions are new and increasingly hypermetabolic, while another index lesion is decreasingly hypermetabolic. 2. No evidence of hypermetabolic soft tissue metastasis.  CT A/P IMPRESSION: 10/28/16 No acute process demonstrated in the abdomen or pelvis. No evidence of bowel obstruction or inflammation.  CT CAP and Bone Scan: 10/05/16  Bones: left intramedullary rod and left femoral head neck screw in place. In the proximal diaphysis of the left femur there is cortical thickening and irregularity. No lytic or blastic bone lesions. No fracture or vertebral endplate destruction.  No definite evidence of recurrent disease in the chest, abdomen, or pelvis.  PET 05/20/2016 IMPRESSION: 1. There multiple metastatic foci in the bony pelvis, including some new abnormal foci compare to the prior PET-CT, compatible with mildly progressive bony metastatic disease. Also, a left T11 vertebral hypermetabolic metastatic lesion is observed, new compared to the prior exam. 2. No active extraosseous malignancy is currently identified. 3. Right anterior fourth rib healing fracture, appears benign.  NM Bone Scan 05/01/2016 IMPRESSION: 1. Small focal uptake involving the anterior rib ends of the left second and right fourth ribs, new since the prior bone scan. The location of this uptake is more suggestive of  a traumatic or inflammatory etiology as opposed to metastatic disease. No other evidence of metastatic disease. 2. There are other areas of stable uptake which are most likely degenerative/reactive, including right shoulder and cervical spine uptake and uptake along the right femur adjacent to the ORIF Hardware.  CT CAP 05/01/2016 IMPRESSION: 1. Stable exam.  No new or progressive disease identified. 2. No evidence for residual or recurrent adenopathy within the chest. 3. Stable bone metastasis.   I have personally reviewed the radiological images as listed and agreed with the findings in the report. Nm Pet Image Restag (ps) Skull Base To Thigh  Result Date: 05/01/2017 CLINICAL DATA:  Subsequent treatment strategy for breast cancer with bone metastasis. Evaluate response to therapy. EXAM: NUCLEAR MEDICINE PET SKULL BASE TO THIGH TECHNIQUE: 6.3 mCi F-18 FDG was injected intravenously. Full-ring PET imaging was performed from the skull base to thigh after the radiotracer. CT data was obtained and used for attenuation correction and anatomic localization. Fasting blood glucose: 83 mg/dl COMPARISON:  01/27/2017  FINDINGS: Mediastinal blood pool activity: SUV max 2.1 NECK: Symmetric submandibular gland and palatine tonsil hypermetabolism is favored to be physiologic. No cervical nodal hypermetabolism. Incidental CT findings: No cervical adenopathy. CHEST: No pulmonary parenchymal or thoracic nodal hypermetabolism. Incidental CT findings: Subtle right thyroid nodule measures 1.3 cm and is not hypermetabolic on image 77/8. This is similar. Left-sided Port-A-Cath terminates at the high right atrium. Right mastectomy and breast reconstruction. Small right axillary nodes without hypermetabolism. Similar. ABDOMEN/PELVIS: No abdominopelvic parenchymal or nodal hypermetabolism. Incidental CT findings: Cholecystectomy.  Normal adrenal glands. SKELETON: Right upper extremity muscular hypermetabolism is without  well-defined CT correlate and may be due to motion after radiopharmaceutical injection, recent trauma, or even minimal tracer extravasation. This measures a S.U.V. max of 4.9, including on approximately image 27/4. Multifocal hypermetabolic osseous metastasis. Index right sacral lesion measures a S.U.V. max of 13.8 versus a S.U.V. max of 7.3 on the prior. Left iliac index lesion measures a S.U.V. max of 18.6 today versus a S.U.V. max of 3.8 on the prior. Index right acetabular lesion measures a S.U.V. max of 14.9 today versus a S.U.V. max of 13.2. Incidental CT findings: Left proximal femoral fixation. IMPRESSION: 1. Primarily increase in hypermetabolism of osseous metastasis. An isolated right acetabular lesion is decreased in hypermetabolism since the prior exam. 2. No evidence of hypermetabolic soft tissue metastasis. 3. Similar non FDG avid right-sided thyroid nodule, favoring a benign etiology. Recommend attention on follow-up. Electronically Signed   By: Abigail Miyamoto M.D.   On: 05/01/2017 16:07     ASSESSMENT & PLAN:  Mrs. Mittman is a 52 y.o. postmenopause female with:  1. 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 -Restaging PET 12-25-15 markedly improved of bone metastasis after initial systemic chemo  -she was switched to letrozole and maintenance Herceptin and the perjeta on 01-16-16.  -Did not tolerate SQ xgeva and it was changed to zometa  -Echocardiogram 01-27-16 showed normal EF, will continue monitoring every 3 months when she is on herceptin -I reviewed her CT scan and bone scan from 05/01/2016, which showed stable disease overall, a few new uptake of anterior ribs on bone scan are indeterminate. -She has developed worsening left hip pain, and more fatigued lately. I have clinical concern of disease progression. -I previously  discussed her  restaging PET scan from 05/20/2016. 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 She declined other chemo.  -Labs reviewed, her bilirubin is slightly elevated but stable, adequate to continue treatment. 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  -She moved to [facility] for 3 months, back to [place] now  -We received records from [facility] in [place], [place]. She underwent CT CAP w contrast on 10/02/16 and bone scan on 09/21/16. CT shows no evidence of recurrent disease in the chest, abdomen, or pelvis; Bone scan indicates no lytic or blastic bone lesions.  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  No other visceral organ metastasis. She  is clinically doing well.  -Kadcyla was switched to Exemestane, oral HER2 antibody treatment lapatinib and herceptin.  -She had severe side effects of diarrhea, depression and low appetite with lapatinib on full dose of 1500 mg after 1 week and she stopped. I recommended her to restart with low dose, and she can only tolerate low dose 500 mg of lapatinib, which she has been on -Her PET Scan from 04/30/17 revealed a primarily increase in hypermetabolism of osseous metastasis. There are no new lesions.  I reviewed the images and compared to the previous images in person with patient and her husband. I suspect her progression is due to her not being able to fully tolerate lapatinib -I recommend that we change her treatment. I discussed options with clinical trial for the new Her2 antibody conjugate Trastuzumab deruxtecan, the phase 3  clinical trial has been open, and the closest site is in [place] and [employer] . She is not interested in going to [place] at this time. -I recommend switch eczema sting to intravenous or oral chemotherapy, and continue Herceptin.  However patient is very reluctant to take chemo.  I recommend her to consider adding CDK4/6 inhibitor Verzenio to her exemestane, and continue herceptin at this time. CDK4/6 inhibitor has been tested in HR+/HER2+ metastatic breast cancer, and showed some positive results but it's not approved in HER2+ disease.  I discussed the potential side effects, which includes but not limited to diarrhea, fatigue, cytopenias especially neutropenia, the risk of infection, abnormal liver function, etc., she voiced good understanding and agrees to proceed. -We again discussed the goal of therapy is palliative, to prolong her life and improve her quality of life. -Plan to repeat scan in 2-3 months, if she has further disease progression on next scan, I will switch her to Xeloda, with or without Neratinib  -She started Verzenio '150mg'$  on 05/04/17 she has not tolerated well given her diarrhea, headaches, taste change and low appetite from taste change.  -I recommend changing her from Vezenio to [place] '100mg'$  once daily 3 weeks on and 1 week off. She was provided samples on 05/24/17 by Pharmacist Denyse Amass -I reviewed [facility] side effects with pt and provided her with reading material. The main side effect is low WBC and mild fatigue and abnormal liver function. Will monitor her blood counts every 2 weeks for the first 2 months.  -For her low appetite, I will change her medication first then suggest appetite stimulant if not enough.  -I advised her to use gloves and face mask when cleaning her cat litter. I provided her with face masks. She should wash her hands before and after cleaning.  -Labs reviewed, PLT 108K, otherwise CBC WNL, potassium 3.2 and bilirubin at 1.9, cr at 1.29. I will give her IV  fluids today.  -I strongly encouraged her to drink more water given her recent diarrhea.  -Continue Exemestane, tolerating well.  -Proceed with Herceptin today  -F/u in 3 weeks    2. History of MRSA skin infection scalp, boil at low back scalp and upper lip -I prescribed doxycycline on 04/24/16 for 7 days  - Resolved now.  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  -Husband also deaf.  4. premature surgical menopause -post BSO 2019fr pelvic pain and ovarian cysts, likely was also beneficial from standpoint of the breast cancer. -hysterectomy.   5. Intermittent anemia  -mild, possible related to prior chemo -She is asymptomatic, we'll continue close monitoring  6. Hypokalemia  -K 3.1  on 03/05/16, she is on KCL 41mq daily, increased to 413m daily for a week. -K 3.2 on 04/03/16. The patient is on KCl 2098mdaily, increased to 11m35maily for a week and then take 1 pill a day as normal. -K 3.3 on 07/16/16, continue KCL 20me28mily  -received IV fluids in 11/25/16  -She does not tolerate liquid potassium so she will continue with tablet form. She will continues 20 mEq daily  -Due to low potassium on 04/12/17, I had her increase to 20 mg (2 10meq57ms) daily -K at 3.2 today, she will increase to BID for 3 days then return to once daily.   7. Goal of care discussion  -We previously discussed the incurable nature of her cancer, and the overall poor prognosis, especially if she does not have good response to chemotherapy or progress on chemo.  -The patient understands the goal of care is palliative. -she is full code for now   8. Bilateral Leg Pain -she initially presented with worsening left leg and hip pain secondary to bone mets, she had surgery before, this was much improved after her prior chemo - The patient previously took Hydrocodone for this pain, which helped. - I refilled Hydrocodone previously on 5/3. She will take 1 tablet every 6 hours as  needed for pain. -Pain has recurred. Recent PET scan showed minimum uptake, unlikely related to bone mets progression  - I previously encouraged her to call Dr. Yates [name] the pain in her left leg. She has followed-up  With him.  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 She is taking oral calcium.  -Next Zometa injection is 06/22/17.   9. Insomnia -I give her Ambien '5mg'$ , but it did not work well.  -continue ativan 1 mg to take at night as needed  -mirtazapine did not work, she also tried trazodone from PCP which gave her a rash. She stopped taking it.   10.  Hyperbilirubinemia  -She has developed mild hyperbilirubinemia 3 weeks ago, TBIL 1.7-1.9, okay to continue Herceptin, lapatinib and exemestane without dose adjustment for now -Related to lapatinib, if she has persistent elevated bilirubin, and next PET scan is negative for liver metastasis, I will stop her lapatinib -Bilirubin at 1.9 today (05/24/17), overall stable, continue monitoring  11. AKI -Her creatinine has increased from 0.83 weeks ago to 1.3 today, likely related to her recent diarrhea -I will give her normal saline 1 L today over 2 hours, I encouraged her to drink fluids adequately  Plan:   -Due to por tolerance will stop Verzenio today, and switch to IbrancPrincipal Financia'100mg'$ , starting in a week, I called in today and she received free samples today. -Labs reviewed and adequate for Herceptin today and continue every 3 weeks -Cr at 1.29 and potassium at 3.2, will give 1L IV fluids today, she will increase her potassium supplement -Lab, flush, f/u and herceptin in 3 weeks    Pt had many questions. All questions were answered. The patient knows to call the clinic with any problems, questions or concerns.  I spent 25 minutes counseling the patient  face to face. The total time spent in the appointment was 30 minutes and more than 50% was on counseling.  This document serves as a record of services personally performed by Josalyn Dettmann FeTruitt MerleIt was  created on her behalf by Joslyn Devon, a trained medical scribe. The creation of this record is based on the scribe's personal observations and the provider's statements to them.   I have reviewed the above documentation for accuracy and completeness, and I agree with the above.     Truitt Merle, MD 05/24/2017 3:22 PM

## 2017-05-24 ENCOUNTER — Encounter: Payer: Self-pay | Admitting: Hematology

## 2017-05-24 ENCOUNTER — Inpatient Hospital Stay: Payer: Medicaid Other | Attending: Hematology

## 2017-05-24 ENCOUNTER — Inpatient Hospital Stay: Payer: Medicaid Other

## 2017-05-24 ENCOUNTER — Telehealth: Payer: Self-pay | Admitting: Pharmacist

## 2017-05-24 ENCOUNTER — Telehealth: Payer: Self-pay | Admitting: Oncology

## 2017-05-24 ENCOUNTER — Telehealth: Payer: Self-pay | Admitting: Hematology

## 2017-05-24 ENCOUNTER — Inpatient Hospital Stay (HOSPITAL_BASED_OUTPATIENT_CLINIC_OR_DEPARTMENT_OTHER): Payer: Medicaid Other | Admitting: Hematology

## 2017-05-24 VITALS — BP 117/62 | HR 70 | Temp 97.7°F | Resp 18 | Ht 67.0 in | Wt 121.4 lb

## 2017-05-24 DIAGNOSIS — Z9221 Personal history of antineoplastic chemotherapy: Secondary | ICD-10-CM

## 2017-05-24 DIAGNOSIS — C799 Secondary malignant neoplasm of unspecified site: Principal | ICD-10-CM

## 2017-05-24 DIAGNOSIS — Z923 Personal history of irradiation: Secondary | ICD-10-CM

## 2017-05-24 DIAGNOSIS — Z170 Estrogen receptor positive status [ER+]: Secondary | ICD-10-CM | POA: Diagnosis not present

## 2017-05-24 DIAGNOSIS — C7951 Secondary malignant neoplasm of bone: Secondary | ICD-10-CM

## 2017-05-24 DIAGNOSIS — C50911 Malignant neoplasm of unspecified site of right female breast: Secondary | ICD-10-CM

## 2017-05-24 DIAGNOSIS — G4700 Insomnia, unspecified: Secondary | ICD-10-CM | POA: Diagnosis not present

## 2017-05-24 DIAGNOSIS — Z5112 Encounter for antineoplastic immunotherapy: Secondary | ICD-10-CM | POA: Diagnosis not present

## 2017-05-24 DIAGNOSIS — C50919 Malignant neoplasm of unspecified site of unspecified female breast: Secondary | ICD-10-CM

## 2017-05-24 LAB — CBC WITH DIFFERENTIAL/PLATELET
BASOS PCT: 0 %
Basophils Absolute: 0 10*3/uL (ref 0.0–0.1)
EOS ABS: 0 10*3/uL (ref 0.0–0.5)
Eosinophils Relative: 1 %
HCT: 34.9 % (ref 34.8–46.6)
HEMOGLOBIN: 12 g/dL (ref 11.6–15.9)
Lymphocytes Relative: 52 %
Lymphs Abs: 2.2 10*3/uL (ref 0.9–3.3)
MCH: 30.2 pg (ref 25.1–34.0)
MCHC: 34.4 g/dL (ref 31.5–36.0)
MCV: 87.7 fL (ref 79.5–101.0)
MONO ABS: 0.1 10*3/uL (ref 0.1–0.9)
MONOS PCT: 3 %
NEUTROS PCT: 44 %
Neutro Abs: 1.8 10*3/uL (ref 1.5–6.5)
Platelets: 108 10*3/uL — ABNORMAL LOW (ref 145–400)
RBC: 3.98 MIL/uL (ref 3.70–5.45)
RDW: 12.5 % (ref 11.2–14.5)
WBC: 4.2 10*3/uL (ref 3.9–10.3)

## 2017-05-24 LAB — COMPREHENSIVE METABOLIC PANEL
ALK PHOS: 66 U/L (ref 40–150)
ALT: 14 U/L (ref 0–55)
AST: 19 U/L (ref 5–34)
Albumin: 3.8 g/dL (ref 3.5–5.0)
Anion gap: 9 (ref 3–11)
BILIRUBIN TOTAL: 1.9 mg/dL — AB (ref 0.2–1.2)
BUN: 14 mg/dL (ref 7–26)
CALCIUM: 9.5 mg/dL (ref 8.4–10.4)
CO2: 25 mmol/L (ref 22–29)
CREATININE: 1.29 mg/dL — AB (ref 0.60–1.10)
Chloride: 107 mmol/L (ref 98–109)
GFR calc Af Amer: 55 mL/min — ABNORMAL LOW (ref >60)
GFR calc non Af Amer: 47 mL/min — ABNORMAL LOW (ref >60)
Glucose, Bld: 86 mg/dL (ref 70–140)
Potassium: 3.2 mmol/L — ABNORMAL LOW (ref 3.5–5.1)
Sodium: 141 mmol/L (ref 136–145)
TOTAL PROTEIN: 7.2 g/dL (ref 6.4–8.3)

## 2017-05-24 MED ORDER — SODIUM CHLORIDE 0.9 % IV SOLN
Freq: Once | INTRAVENOUS | Status: AC
  Administered 2017-05-24: 12:00:00 via INTRAVENOUS

## 2017-05-24 MED ORDER — LORATADINE 10 MG PO TABS
ORAL_TABLET | ORAL | Status: AC
  Filled 2017-05-24: qty 1

## 2017-05-24 MED ORDER — ACETAMINOPHEN 325 MG PO TABS
ORAL_TABLET | ORAL | Status: AC
  Filled 2017-05-24: qty 2

## 2017-05-24 MED ORDER — TRASTUZUMAB CHEMO 150 MG IV SOLR
6.0000 mg/kg | Freq: Once | INTRAVENOUS | Status: AC
  Administered 2017-05-24: 357 mg via INTRAVENOUS
  Filled 2017-05-24: qty 17

## 2017-05-24 MED ORDER — SODIUM CHLORIDE 0.9 % IV SOLN: INTRAVENOUS | Status: AC

## 2017-05-24 MED ORDER — HEPARIN SOD (PORK) LOCK FLUSH 100 UNIT/ML IV SOLN
500.0000 [IU] | Freq: Once | INTRAVENOUS | Status: AC | PRN
  Administered 2017-05-24: 500 [IU]
  Filled 2017-05-24: qty 5

## 2017-05-24 MED ORDER — PALBOCICLIB 100 MG PO CAPS: 100.0000 mg | ORAL_CAPSULE | Freq: Every day | ORAL | 0 refills | Status: DC

## 2017-05-24 MED ORDER — SODIUM CHLORIDE 0.9% FLUSH
10.0000 mL | INTRAVENOUS | Status: DC | PRN
  Administered 2017-05-24: 10 mL
  Filled 2017-05-24: qty 10

## 2017-05-24 MED ORDER — LORATADINE 10 MG PO TABS
10.0000 mg | ORAL_TABLET | Freq: Every day | ORAL | Status: DC
  Administered 2017-05-24: 10 mg via ORAL

## 2017-05-24 MED ORDER — SODIUM CHLORIDE 0.9 % IV SOLN
Freq: Once | INTRAVENOUS | Status: AC
  Administered 2017-05-24: 13:00:00 via INTRAVENOUS

## 2017-05-24 MED ORDER — ACETAMINOPHEN 325 MG PO TABS
650.0000 mg | ORAL_TABLET | Freq: Once | ORAL | Status: AC
  Administered 2017-05-24: 650 mg via ORAL

## 2017-05-24 NOTE — Telephone Encounter (Signed)
Appointments scheduled AVS/Calendar printed per 5/13 los °

## 2017-05-24 NOTE — Telephone Encounter (Signed)
Oral Oncology Pharmacist Encounter  Received new prescription for Ibrance (palbociclib) for the treatment of progressive, metastatic, hormone receptor positive breast cancer in conjunction with exemestane, planned duration until disease progression or unacceptable toxicity.  Patient started on treatment with Verzenio (abemeciclib) on 05/08/17, Verzenio with be discontinued today secondary to taste changes, decreased appetite, and 17lb weight loss since initiation.  Labs from 05/24/17 assessed, OK for treatment. Noted SCr slightly elevated above baseline to 1.29, est CrCl ~ 45 mL/min, no dose adjustment per manufacturer Noted dose reduction to 100mg  daily at initiation for increased tolerance  Current medication list in Epic reviewed, no significant DDIs with Ibrance identified.  Prescription has been e-scribed to the [facility] for benefits analysis and approval.  Oral Oncology Clinic will continue to follow for insurance authorization, copayment issues, initial counseling and start date.  Johny Drilling, PharmD, BCPS, BCOP  05/24/2017 1:42 PM Oral Oncology Clinic [phone]

## 2017-05-24 NOTE — Telephone Encounter (Signed)
Oral Chemotherapy Pharmacist Encounter   I spoke with patient, spouse, and interpreter in infusion room for overview of: Ibrance.   Counseled patient on administration, dosing, side effects, monitoring, drug-food interactions, safe handling, storage, and disposal.  Patient will be initiated at decreased dose for toleration. Patient informed dose may change based on toleration in the future.  Patient will take Ibrance 100mg  capsules, 1 capsule by mouth once daily with lunch for 3 weeks on, 1 week off.  Patient knows to avoid grapefruit and grapefruit juice.  Patient is taking exemestane once daily with lunch.  Ibrance start date: 05/31/17  Adverse effects include but are not limited to: fatigue, hair loss, GI upset, nausea, decreased blood counts, and increased upper respiratory infections. Patient will obtain anti diarrheal and alert the office of 4 or more loose stools above baseline.  Patient reminded of WBC check on Cycle 1 Day 14 for dose and ANC assessment. This will occur on 06/14/17.  Reviewed with patient importance of keeping a medication schedule and plan for any missed doses.  Mrs. Eisler voiced understanding and appreciation.   All questions answered. Medication reconciliation performed and medication/allergy list updated.  Patient provided samples for 1st fill.  Medication: Ibrance 100mg  capsules Instructions: Take 1 capsule (100mg ) by mouth once daily with lunch, take for 21 days on, 7 days off, reeat every 28 days Quantity dispensed: 21 Days supply: 28 Manufacturer: Pfizer Lot: [id number] Exp: 04/2018    Patient knows to call the office with questions or concerns. Oral Oncology Clinic will continue to follow.  Thank you,  Johny Drilling, PharmD, BCPS, BCOP  05/24/2017   1:59 PM Oral Oncology Clinic [phone]

## 2017-05-24 NOTE — Patient Instructions (Signed)
[facility] Discharge Instructions for Patients Receiving Chemotherapy  Today you received the following chemotherapy agents Herceptin  To help prevent nausea and vomiting after your treatment, we encourage you to take your nausea medication as directed   If you develop nausea and vomiting that is not controlled by your nausea medication, call the clinic.   BELOW ARE SYMPTOMS THAT SHOULD BE REPORTED IMMEDIATELY:  *FEVER GREATER THAN 100.5 F  *CHILLS WITH OR WITHOUT FEVER  NAUSEA AND VOMITING THAT IS NOT CONTROLLED WITH YOUR NAUSEA MEDICATION  *UNUSUAL SHORTNESS OF BREATH  *UNUSUAL BRUISING OR BLEEDING  TENDERNESS IN MOUTH AND THROAT WITH OR WITHOUT PRESENCE OF ULCERS  *URINARY PROBLEMS  *BOWEL PROBLEMS  UNUSUAL RASH Items with * indicate a potential emergency and should be followed up as soon as possible.  Feel free to call the clinic should you have any questions or concerns. The clinic phone number is [phone].  Please show the CHEMO ALERT CARD at check-in to the Emergency Department and triage nurse.   

## 2017-06-05 ENCOUNTER — Encounter (HOSPITAL_COMMUNITY): Payer: Self-pay | Admitting: Oncology

## 2017-06-11 NOTE — Progress Notes (Signed)
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 She had mastectomy with the 7 axillary node evaluation, 4 cycles of adriamycin/ cytoxan followed by taxotere, then five years of tamoxifen thru June 2005 (no herceptin in 1999). She had bilateral oophorectomy in May 2005. She was briefly on aromatase inhibitor after tamoxifen, but discontinued this herself due to poor tolerance.      04/04/2015 Genetic Testing    Negative genetic testing on the breast/ovarian cancer pnael.  The Breast/Ovarian gene panel offered by [facility] includes sequencing and rearrangement analysis for the following 20 genes:  ATM, BARD1, BRCA1, BRCA2, BRIP1, CDH1, CHEK2, EPCAM, FANCC, MLH1, MSH2, MSH6, NBN, PALB2, PMS2, PTEN, RAD51C, RAD51D, TP53, and XRCC2.      06/26/2015 Pathology Results    Initial Path Report Bone, curettage, Left femur met, intramedullary subtroch tissue - METASTATIC ADENOCARCINOMA.  Estrogen Receptor: 95%, POSITIVE, STRONG STAINING INTENSITY Progesterone Receptor: 2%, POSITIVE, STRONG  HER2 (+), IHC 3+      06/26/2015 Surgery    Biopsy of metastatic tissue and stabilization with Affixus trochanteric nail, proximal and distal interlock of left femur by Dr. [name]       06/28/2015 Imaging    CT Chest w/ Contrast 1. Extensive right internal mammary chain lymphadenopathy consistent with metastatic breast cancer. 2. Lytic metastases involving the posterior elements at  T1 with probable nondisplaced pathologic fracture of the spinous process. 3. No other evidence of thoracic metastatic disease. 4. Trace bilateral pleural effusions with associated bibasilar atelectasis. 5. Indeterminate right thyroid nodule, not previously imaged. This could be evaluated with thyroid ultrasound as clinically warranted.      07/01/2015 Imaging    Bone Scan 1. Increased activity noted throughout the left femur. Although metastatic disease cannot be excluded, these changes are most likely secondary to prior surgery. 2. No other focal abnormalities identified to suggest metastatic disease.      07/12/2015 - 07/26/2015 Radiation Therapy    Left femur, 30 Gy in 10 fractions by Dr. Sondra Come       07/14/2015 Initial Diagnosis    [name], right ([place])      07/19/2015 Imaging    Initial PET Scan 1. Severe multifocal osseous metastatic disease, much greater than anticipated based on prior imaging. 2. Multifocal nodal metastases involving the right internal mammary, prevascular and subpectoral lymph nodes. No axillary pulmonary involvement identified. 3. No distant extra osseous metastases.      07/30/2015 - 11/29/2015 Chemotherapy    Docetaxel, Herceptin and Perjeta every 3 weeks X6 cycles, pt tolerated moderately well, restaging scan showed excellent response       09/18/2015 Imaging    CT Head w/wo Contrast 1. No acute intracranial abnormality or significant interval change. 2. Stable minimal periventricular white matter hypoattenuation on the right. This may reflect a remote ischemic injury. 3. No evidence for metastatic disease to the brain. 4. No focal soft tissue lesion to explain the patient's tenderness.  10/30/2015 Imaging    CT Abdomen Pelvis w/ Contrast 1. Few mildly prominent fluid-filled loops of small bowel scattered throughout the abdomen, with intraluminal fluid density within the distal colon. Given the provided history, findings  are suggestive of acute enteritis/diarrheal illness. 2. No other acute intra-abdominal or pelvic process identified. 3. Widespread osseous metastatic disease, better evaluated on most recent PET-CT from 07/19/2015. No pathologic fracture or other complication. 4. 11 mm hypodensity within the left kidney. This lesion measures intermediate density, and is indeterminate. While this lesion is similar in size relative to recent studies, this is increased in size relative to prior study from 2012. Further evaluation with dedicated renal mass protocol CT and/or MRI is recommended for complete characterization.      12/25/2015 Imaging    Restaging PET Scan 1. Markedly improved skeletal activity, with only several faint foci of residual accentuated metabolic activity, but resolution of the vast majority of the previously extensive osseous metastatic disease. 2. Resolution of the prior right internal mammary and right prevascular lymph nodes. 3. Residual subcutaneous edema and edema along fascia planes in the left upper thigh. This remains somewhat more than I would expect for placement of an IM nail 6 weeks ago. If there is leg swelling further down, consider Doppler venous ultrasound to rule out left lower extremity DVT. I do not perceive an obvious difference in density between the pelvic and common femoral veins on the noncontrast CT data. 4. Chronic right maxillary and right sphenoid sinusitis.      12/2015 -  Chemotherapy    Maintenance Herceptin and pejeta every 3 weeks      01/16/2016 -  Anti-estrogen oral therapy    Letrozole 2.5 mg daily      03/01/2016 Miscellaneous    Patient presented to ED following a fall; she reports she landed on her back and is now experiencing back pain. The patient was evaluated and discharge home same day with pain control.      05/01/2016 Imaging    CT CAP IMPRESSION: 1. Stable exam.  No new or progressive disease identified. 2. No evidence for residual or  recurrent adenopathy within the chest. 3. Stable bone metastasis.      05/01/2016 Imaging    BONE SCAN IMPRESSION: 1. Small focal uptake involving the anterior rib ends of the left second and right fourth ribs, new since the prior bone scan. The location of this uptake is more suggestive of a traumatic or inflammatory etiology as opposed to metastatic disease. No other evidence of metastatic disease. 2. There are other areas of stable uptake which are most likely degenerative/reactive, including right shoulder and cervical spine uptake and uptake along the right femur adjacent to the ORIF hardware.      05/20/2016 Imaging    NM PET Skull to Thigh IMPRESSION: 1. There multiple metastatic foci in the bony pelvis, including some new abnormal foci compare to the prior PET-CT, compatible with mildly progressive bony metastatic disease. Also, a left T11 vertebral hypermetabolic metastatic lesion is observed, new compared to the prior exam. 2. No active extraosseous malignancy is currently identified. 3. Right anterior fourth rib healing fracture, appears be      06/04/2016 -  Chemotherapy    Second line chemotherapy Kadcyla every 3 weeks started on 06/04/16       10/05/2016 Imaging    CT CAP and Bone Scan:  Bones: left intramedullary rod and left femoral head neck screw in place. In the proximal diaphysis of the left femur  there is cortical thickening and irregularity. No lytic or blastic bone lesions. No fracture or vertebral endplate destruction.   No definite evidence of recurrent disease in the chest, abdomen, or pelvis.      10/28/2016 Imaging    CT A/P IMPRESSION: No acute process demonstrated in the abdomen or pelvis. No evidence of bowel obstruction or inflammation.      11/04/2016 Imaging    DG foot complete left: IMPRESSION: No fracture or dislocation.  No soft tissue abnormality      11/23/2016 Imaging    CT RIGHT FEMUR IMPRESSION: Negative CT scan of the  right thigh.  No visible metastatic disease.  CT LEFT FEMUR IMPRESSION: 1. Postsurgical changes related to prior cephalomedullary rod fixation of the left femur with linear lucency along the anterolateral proximal femoral cortex at level of the lesser trochanter, suspicious for nondisplaced fracture. 2. Slightly permeative appearance of the proximal left femoral cortex at the site of known prior osseous metastases is largely unchanged. 3. Unchanged patchy lucency and sclerosis along the anterior acetabulum, which appears to correspond with an area of faint uptake on the prior PET-CT, suspicious for osseous metastasis. These results will be called to the ordering clinician or representative by the Radiologist Assistant, and communication documented in the PACS or zVision Dashboard.        01/27/2017 Imaging    IMPRESSION: 1. Mixed response to osseous metastasis. Some hypermetabolic lesions are new and increasingly hypermetabolic, while another index lesion is decreasingly hypermetabolic. 2. No evidence of hypermetabolic soft tissue metastasis.       04/30/2017 PET scan    IMPRESSION: 1. Primarily increase in hypermetabolism of osseous metastasis. An isolated right acetabular lesion is decreased in hypermetabolism since the prior exam. 2. No evidence of hypermetabolic soft tissue metastasis. 3. Similar non FDG avid right-sided thyroid nodule, favoring a benign etiology. Recommend attention on follow-up.       HISTORY OF PRESENTING ILLNESS (From Dr. Mariana Kaufman note on 02/06/2016):  [name] 52 y.o. 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 She has had an excellent response to 6 cycles of taxotere herceptin perjeta from 07-30-15 thru 11-29-15. She is continuing herceptin and perjeta every 3 weeks. She had extreme local pain with first xgeva injection on 01-16-16 and prefers  resuming previous zometa. She began letrozole ~ 01-16-16, has no complaints about this at all.  Last imaging was PET 12-25-15.  NOTE CTs, plain Xrays and bone scan did NOT show any of the extensively metastatic bone involvement 06-2015; MRI and PET did image disease clearly.  Patient continues to do very well overall. MRSA skin lesions on scalp and face improved quickly with doxycycline starting 01-16-16. She continues to use Dial soap and we have again strongly encouraged good hygiene with other family members at home. She has had the 3 lymphedema PT sessions allowed by insurance, continues to do massage for LLE herself, still has not gotten the ordered compression apparatus for home and will follow up with PT on that. Swelling in LLE has improved with the interventions and does not seem as uncomfortable. She denies any pain. Appetite is excellent, bowels fine, no SOB, good energy, no problems with PAC, no noted changes in left breast. No bleeding. No fever or symptoms of infection. She slipped in tub last week, fell onto right arm fortunately without injury. She was upset by a visitor and shaky then, but no tremors otherwise.  She has two children and 4 grandchildren. She has had genetic testing and it was negative. She lives at home with her husband. She is very active. Her children live close to her.   CURRENT THERAPY:    Exemestane '25mg'$  daily and herceptin every 3 weeks, Ibrance '100mg'$  started on 05/31/17. Held for 1 week on 06/14/17 due to pancytopenia. Plan to restart her at '75mg'$  daily, 3 weeks on and one week off.   INTERIM HISTORY:   RUTHANNA MACCHIA returns today for follow up and ongoing treatment. She presents to the clinic today with her interpreter. Patient notes she is doing well. She recently went to the beach and enjoyed it. She has been on Ibrance and has tolerated it with mild diarrhea. She started it 3 weeks ago. She completed her sample. She notes to drinking flavored water at home along  with milk.     On review of symptoms, pt notes she has some diarrhea. She notes her eating has reduced due to family stress. She notes itching of her skin from her sunburn on her legs.       MEDICAL HISTORY:  Past Medical History:  Diagnosis Date  . Abnormal Pap smear   . Anxiety   . Breast cancer ([place])   . Cancer ([place]) 1999   breast-s/p mastectomy, chemo, rad  . CIN I (cervical intraepithelial neoplasia I) 2003   by colpo  . Congenital deafness   . Deaf   . Depression   . Radiation 07/12/15-07/26/15   left femur 30 Gy  . S/P bilateral oophorectomy     SURGICAL HISTORY: Past Surgical History:  Procedure Laterality Date  . ABDOMINAL HYSTERECTOMY    . INTRAMEDULLARY (IM) NAIL INTERTROCHANTERIC Left 06/26/2015   Procedure: LEFT BIOMET LONG AFFIXS NAIL;  Surgeon: [name], MD;  Location: [facility];  Service: Orthopedics;  Laterality: Left;  . IR GENERIC HISTORICAL  08/06/2015   IR US GUIDE VASC ACCESS LEFT 08/06/2015 Aletta Edouard, MD [facility]  . IR GENERIC HISTORICAL  08/06/2015   IR FLUORO GUIDE CV LINE LEFT 08/06/2015 Aletta Edouard, MD [facility]  . IR GENERIC HISTORICAL  03/05/2016   IR CV LINE INJECTION 03/05/2016 Markus Daft, MD [facility]  . MASTECTOMY     right breast  . MASTECTOMY    . OVARIAN CYST REMOVAL    . RADIOLOGY WITH ANESTHESIA Left 06/25/2015   Procedure: MRI OF LEFT HIP WITH OR WITHOUT CONTRAST;  Surgeon: Medication Radiologist, MD;  Location: [facility];  Service: Radiology;  Laterality: Left;  DR. MCINTYRE/MRI  . TUBAL LIGATION      SOCIAL HISTORY: Social History   Socioeconomic History  . Marital status: Married    Spouse name: Not on file  . Number of children: 2  . Years of education: Not on file  . Highest education level: Not on file  Occupational History  . Not on file  Social Needs  . Financial resource strain: Not on file  . Food insecurity:    Worry: Not on file    Inability: Not on file  . Transportation needs:    Medical: Not  on file    Non-medical: Not on file  Tobacco Use  . Smoking status: Never Smoker  . Smokeless tobacco: Never Used  Substance and Sexual Activity  . Alcohol use: No  . Drug use: No  . Sexual activity: Yes    Birth control/protection: Surgical  Lifestyle  . Physical activity:    Days per week:  Not on file    Minutes per session: Not on file  . Stress: Not on file  Relationships  . Social connections:    Talks on phone: Not on file    Gets together: Not on file    Attends religious service: Not on file    Active member of club or organization: Not on file    Attends meetings of clubs or organizations: Not on file    Relationship status: Not on file  . Intimate partner violence:    Fear of current or ex partner: Not on file    Emotionally abused: Not on file    Physically abused: Not on file    Forced sexual activity: Not on file  Other Topics Concern  . Not on file  Social History Narrative  . Not on file    FAMILY HISTORY: Family History  Problem Relation Age of Onset  . Hypertension Mother   . Seizures Mother   . Alzheimer's disease Maternal Uncle   . Pancreatitis Maternal Grandmother   . Heart attack Maternal Grandfather     ALLERGIES:  is allergic to chlorhexidine; promethazine hcl; and diphenhydramine hcl.  MEDICATIONS:  Current Outpatient Medications  Medication Sig Dispense Refill  . Cholecalciferol 2000 units CAPS Take 1 capsule by mouth daily.    . diazepam (VALIUM) 5 MG tablet TAKE 1 TABLET BY MOUTH AT BEDTIME AS NEEDED FOR ANXIETY 30 tablet 0  . divalproex (DEPAKOTE) 250 MG DR tablet Take 1 tablet (250 mg total) by mouth at bedtime. 30 tablet 2  . exemestane (AROMASIN) 25 MG tablet Take 1 tablet (25 mg total) by mouth daily after breakfast. 30 tablet 3  . HYDROcodone-acetaminophen (NORCO) 10-325 MG tablet Take 1 tablet by mouth every 6 (six) hours as needed. 45 tablet 0  . HYDROmorphone (DILAUDID) 2 MG tablet Take 1 tablet (2 mg total) by mouth every 4  (four) hours as needed for severe pain. 30 tablet 0  . lidocaine-prilocaine (EMLA) cream Apply 1 application as needed topically. Apply to Porta-Cath 1-2 hours prior to access as directed. 30 g 2  . prochlorperazine (COMPAZINE) 10 MG tablet Take 1 tablet (10 mg total) by mouth every 6 (six) hours as needed for nausea or vomiting. 30 tablet 2  . traZODone (DESYREL) 50 MG tablet Take 1 tablet (50 mg total) by mouth at bedtime as needed for sleep. 30 tablet 3  . palbociclib (IBRANCE) 75 MG capsule Take 1 capsule (75 mg total) by mouth daily with breakfast. Take whole with food. Take for 21 days on, 7 days off, repeat every 28 days. 21 capsule 0  . potassium chloride (K-DUR) 10 MEQ tablet Take 2 tablets (20 mEq total) by mouth daily. 60 tablet 2   No current facility-administered medications for this visit.    Facility-Administered Medications Ordered in Other Visits  Medication Dose Route Frequency Provider Last Rate Last Dose  . 0.9 %  sodium chloride infusion   Intravenous Once Truitt Merle, MD      . acetaminophen (TYLENOL) tablet 650 mg  650 mg Oral Once Truitt Merle, MD      . heparin lock flush 100 unit/mL  500 Units Intracatheter Once PRN Truitt Merle, MD      . loratadine (CLARITIN) tablet 10 mg  10 mg Oral Daily Truitt Merle, MD      . sodium chloride 0.9 % 1,000 mL with potassium chloride 10 mEq infusion   Intravenous Continuous Livesay, Tamala Julian, MD   Stopped at  09/10/15 1653  . sodium chloride 0.9 % injection 10 mL  10 mL Intravenous PRN Livesay, Lennis P, MD      . sodium chloride flush (NS) 0.9 % injection 10 mL  10 mL Intracatheter PRN Truitt Merle, MD      . trastuzumab (HERCEPTIN) 357 mg in sodium chloride 0.9 % 250 mL chemo infusion  6 mg/kg (Treatment Plan Recorded) Intravenous Once Truitt Merle, MD        REVIEW OF SYSTEMS:   Constitutional: Denies fevers, chills or abnormal night sweats (+) low appetite from stress (+) weight stable Eyes: Denies blurriness of vision, double vision or watery  eyes Ears, nose, mouth, throat, and face: Denies mucositis or sore throat (+) deaf Respiratory: Denies cough, dyspnea or wheezes Cardiovascular: Denies palpitation, chest discomfort or lower extremity swelling Skin: (+) Sunburn on anterior lower legs.  Gastrointestinal:  Denies nausea (+) very mild diarrhea Lymphatics: Denies new lymphadenopathy or easy bruising. MSK: (+) Left leg pain, stable Behavioral/Psych: (+)mild anxiety, stress All other systems were reviewed with the patient and are negative.  PHYSICAL EXAMINATION: ECOG PERFORMANCE STATUS: 1 BP 120/78 (BP Location: Left Arm, Patient Position: Sitting)   Pulse 62   Temp 98 F (36.7 C) (Oral)   Resp 18   Ht _0  (1.702 m)   Wt 122 lb 6.4 oz (55.5 kg)   SpO2 100%   BMI 19.17 kg/m    GENERAL:alert, no distress and comfortable  EYES: normal, conjunctiva are pink and non-injected, sclera clear OROPHARYNX:no exudate, no erythema and lips, buccal mucosa, and tongue normal  NECK: supple, thyroid normal size, non-tender, without nodularity LYMPH:  no palpable lymphadenopathy in the cervical, axillary or inguinal LUNGS: clear to auscultation and percussion with normal breathing effort HEART: regular rate & rhythm and no murmurs and no lower extremity edema ABDOMEN:abdomen soft, non-tender and normal bowel sounds. No organomegaly. Musculoskeletal:no cyanosis of digits and no clubbing  PSYCH: alert & oriented x 3 with fluent speech NEURO: no focal motor/sensory deficits Breasts: Breast inspection showed Right breast is surgically absent status post reconstruction. Palpation of the breasts and axilla revealed no obvious mass that I could appreciate.   LABORATORY DATA:  I have reviewed the data as listed CBC Latest Ref Rng & Units 06/14/2017 05/24/2017 05/03/2017  WBC 3.9 - 10.3 K/uL 2.0(L) 4.2 4.8  Hemoglobin 11.6 - 15.9 g/dL 10.5(L) 12.0 11.9  Hematocrit 34.8 - 46.6 % 29.7(L) 34.9 34.2(L)  Platelets 145 - 400 K/uL 176 108(L) 156    CMP Latest Ref Rng & Units 06/14/2017 05/24/2017 05/03/2017  Glucose 70 - 140 mg/dL 90 86 78  BUN 7 - 26 mg/dL _1 Creatinine 0.60 - 1.10 mg/dL 1.05 1.29(H) 0.86  Sodium 136 - 145 mmol/L 140 141 140  Potassium 3.5 - 5.1 mmol/L 3.2(L) 3.2(L) 3.5  Chloride 98 - 109 mmol/L 107 107 109  CO2 22 - 29 mmol/L _2 Calcium 8.4 - 10.4 mg/dL 8.9 9.5 9.2  Total Protein 6.4 - 8.3 g/dL 6.8 7.2 6.7  Total Bilirubin 0.2 - 1.2 mg/dL 2.7(H) 1.9(H) 1.7(H)  Alkaline Phos 40 - 150 U/L 65 66 61  AST 5 - 34 U/L _3 ALT 0 - 55 U/L _4 PROCEDURES:  Echo 04/22/2017 LV EF: 55% -   60%  ------------------------------------------------------------------- Indications:      Breast cancer (C50.911). Global strain performed on Epiq 7 firmware 2.0.2.  ------------------------------------------------------------------- History:   Risk factors:  Breast  cancer. Breast reconstruction. Anemia.  ------------------------------------------------------------------- Study Conclusions  - Left ventricle: The cavity size was normal. Systolic function was   normal. The estimated ejection fraction was in the range of 55%   to 60%. Wall motion was normal; there were no regional wall   motion abnormalities. Doppler parameters are consistent with   abnormal left ventricular relaxation (grade 1 diastolic   dysfunction). Doppler parameters are consistent with   indeterminate ventricular filling pressure. - Aortic valve: Transvalvular velocity was within the normal range.   There was no stenosis. There was no regurgitation. - Mitral valve: Transvalvular velocity was within the normal range.   There was no evidence for stenosis. There was trivial   regurgitation. - Right ventricle: The cavity size was normal. Wall thickness was   normal. Systolic function was normal. - Atrial septum: No defect or patent foramen ovale was identified. - Tricuspid valve: There was mild regurgitation. - Pulmonary  arteries: Systolic pressure was within the normal   range. PA peak pressure: 24 mm Hg (S). - Global longitudinal strain -16.7% (mildly abnormal). Slight   change from 12/2016.  RADIOGRAPHIC STUDIES:  PET Scan 04/30/17 IMPRESSION: 1. Primarily increase in hypermetabolism of osseous metastasis. An isolated right acetabular lesion is decreased in hypermetabolism since the prior exam. 2. No evidence of hypermetabolic soft tissue metastasis. 3. Similar non FDG avid right-sided thyroid nodule, favoring a benign etiology. Recommend attention on follow-up.  PET SCAN: 01/27/17 IMPRESSION: 1. Mixed response to osseous metastasis. Some hypermetabolic lesions are new and increasingly hypermetabolic, while another index lesion is decreasingly hypermetabolic. 2. No evidence of hypermetabolic soft tissue metastasis.  CT A/P IMPRESSION: 10/28/16 No acute process demonstrated in the abdomen or pelvis. No evidence of bowel obstruction or inflammation.  CT CAP and Bone Scan: 10/05/16  Bones: left intramedullary rod and left femoral head neck screw in place. In the proximal diaphysis of the left femur there is cortical thickening and irregularity. No lytic or blastic bone lesions. No fracture or vertebral endplate destruction.  No definite evidence of recurrent disease in the chest, abdomen, or pelvis.  PET 05/20/2016 IMPRESSION: 1. There multiple metastatic foci in the bony pelvis, including some new abnormal foci compare to the prior PET-CT, compatible with mildly progressive bony metastatic disease. Also, a left T11 vertebral hypermetabolic metastatic lesion is observed, new compared to the prior exam. 2. No active extraosseous malignancy is currently identified. 3. Right anterior fourth rib healing fracture, appears benign.  NM Bone Scan 05/01/2016 IMPRESSION: 1. Small focal uptake involving the anterior rib ends of the left second and right fourth ribs, new since the prior bone scan. The location of  this uptake is more suggestive of a traumatic or inflammatory etiology as opposed to metastatic disease. No other evidence of metastatic disease. 2. There are other areas of stable uptake which are most likely degenerative/reactive, including right shoulder and cervical spine uptake and uptake along the right femur adjacent to the ORIF Hardware.  CT CAP 05/01/2016 IMPRESSION: 1. Stable exam.  No new or progressive disease identified. 2. No evidence for residual or recurrent adenopathy within the chest. 3. Stable bone metastasis.   I have personally reviewed the radiological images as listed and agreed with the findings in the report. No results found.   ASSESSMENT & PLAN:  Mrs. Dungee is a 52 y.o. postmenopause female with:  1. Metastatic  right breast cancer to bone, ER+ HER 2+ -I previously reviewed her medical records extensively, and confirmed the points with patient, sees  the oncology history summary above. -Restaging PET 12-25-15 markedly improved of bone metastasis after initial systemic chemo  -she was switched to letrozole and maintenance Herceptin and the perjeta on 01-16-16.  -Did not tolerate SQ xgeva and it was changed to zometa  -Echocardiogram 01-27-16 showed normal EF, will continue monitoring every 3 months when she is on herceptin -I reviewed her CT scan and bone scan from 05/01/2016, which showed stable disease overall, a few new uptake of anterior ribs on bone scan are indeterminate. -She has developed worsening left hip pain, and more fatigued lately. I have clinical concern of disease progression. -I previously  discussed her restaging PET scan from 05/20/2016. 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 She declined other chemo.  -Labs reviewed, her bilirubin is slightly elevated but stable, adequate to continue treatment. Her tumor marker  CA27.29  has been trending down.  -She moved to [facility] for 3 months, back to [place] now  -We received records from [facility] in [place], [place]. She underwent CT CAP w contrast on 10/02/16 and bone scan on 09/21/16. CT shows no evidence of recurrent disease in the chest, abdomen, or pelvis; Bone scan indicates no lytic or blastic bone lesions.  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  No other visceral organ metastasis. She is clinically doing well.  -Kadcyla was switched to Exemestane, oral HER2 antibody treatment lapatinib and herceptin.  -She had severe side effects of diarrhea, depression and low appetite with lapatinib on full dose of 1500 mg after 1 week and she stopped. I recommended her to restart with low dose, and she can only tolerate low dose 500 mg of lapatinib, which she has been on -Her PET Scan from 04/30/17 revealed a primarily increase in hypermetabolism of osseous metastasis. There are no new lesions.  I reviewed the images and compared to the previous images in person with patient and her husband. I suspect her progression is due to her not being able to fully tolerate lapatinib -I recommend that we change her treatment. I discussed options with clinical trial for the new Her2 antibody conjugate Trastuzumab deruxtecan, the phase 3 clinical trial has been open, and the closest site is in [place] and [employer] . She is not interested in going to [place] at this time. --She started Verzenio '150mg'$  on 05/04/17 she has not tolerated well given her diarrhea, headaches, taste change and low appetite from taste change.  -I switched her to Ibrance '100mg'$  and she started on  05/31/17. She has tolerated well with mild diarrhea and some skin ithcing.  -She recently gotten sunburned. I explained her medicine make her more sensitive to the sun. She should wear a hat and at least SPF 50 when out in the sun, especially for long periods of time.  -Labs reviewed, she has leukopenia at 2.0 and neutropenia at 0.7 from [place]. I want her to stop Ibrance for 1 week to allow her counts to recover. She will repeat labs in 1 week before restarting Ibrance at '75mg'$  and refill her at that time.  -Continue Exemestane,  tolerating well.  -Labs adequate to proceed with Herceptin today  -I encouraged her to continue to drink plenty of water.  -F/u in 3 weeks  -plan to restaging in mid July    2. History of MRSA skin infection scalp, boil at low back scalp and upper lip -I prescribed doxycycline on 04/24/16 for 7 days  - Resolved now.  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  -Husband also deaf.  4. premature surgical menopause -post BSO 2039fr pelvic pain and ovarian cysts, likely was also beneficial from standpoint of the breast cancer. -hysterectomy.   5. Intermittent anemia  -mild, possible related to prior chemo -She is asymptomatic, we'll continue close monitoring -Hg decreased due to Ibrance, at 10.5 today (06/14/17).   6. Hypokalemia  -She has had intermittent hypokalemia -She is now on potassium 20 mEq twice daily -Potassium 3.2 this morning, she has not taken potassium p.o. this morning, will continue twice daily  7. Goal of care discussion  -We previously discussed the incurable nature of her cancer, and the overall poor prognosis, especially if she does not have good response to chemotherapy or progress on chemo.  -The patient understands the goal of care is palliative. -she is full code for now   8. Bilateral Leg Pain -she initially presented with worsening left leg and hip pain secondary to bone mets, she had surgery before,  this was much improved after her prior chemo - The patient previously took Hydrocodone for this pain, which helped. - I refilled Hydrocodone previously on 5/3. She will take 1 tablet every 6 hours as needed for pain. -Pain has recurred. Recent PET scan showed minimum uptake, unlikely related to bone mets progression  - I previously encouraged her to call Dr. YLorin Mercyabout the pain in her left leg. She has followed-up  With him.  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 She is taking oral calcium.  -Next Zometa injection today   9. Insomnia -I give her Ambien 530m but it did not work well.  -continue ativan 1 mg to take at night as needed  -mirtazapine did not work, she also tried trazodone from PCP which gave her a rash. She stopped taking it.   10.  Hyperbilirubinemia  -She has developed mild hyperbilirubinemia recently, TBIL 1.7-1.9, okay to continue Herceptin, lapatinib and exemestane without dose adjustment for now -tbil 2.7 today, asymptomatic, she will be off Ibrance this week and repeat lab in one week  -If she has worsening and persistent hyperbilirubinemia, I will scan her sooner to rule out liver metastasis.  11. AKI  -Her creatinine has increased from 0.83 weeks ago to 1.3 on 05/24/17, likely related to her recent diarrhea. I gave her normal saline 1 L over 2 hours.  -I again encouraged her to drink fluids adequately -resolved now    Plan:   -Hold Ibrance for 1 week due to pancytopenia and hyperbilirubinemia -Labs reviewed and overall adequate to proceed with Herceptin today  -continue Exemestane  -Lab and flush in one week (in the morning), if ANC>1.0 and TBIL improves, will start Ibrance at 75 mg daily, 3 weeks on, one-week off  -Lab, flush, f/u and herceptin (in a bed)  in 3 weeks  -She  need interpreter    Pt had many questions. All questions  were answered. The patient knows to call the clinic with any problems, questions or concerns.  I spent 25 minutes counseling the patient face to face. The total time spent in the appointment was 30 minutes and more than 50% was on counseling.  Oneal Deputy, am acting as scribe for Truitt Merle, MD.   I have reviewed the above documentation for accuracy and completeness, and I agree with the above.      Truitt Merle, MD 06/14/2017 9:12 AM

## 2017-06-14 ENCOUNTER — Inpatient Hospital Stay: Payer: Medicaid Other

## 2017-06-14 ENCOUNTER — Encounter: Payer: Self-pay | Admitting: Hematology

## 2017-06-14 ENCOUNTER — Telehealth: Payer: Self-pay | Admitting: Hematology

## 2017-06-14 ENCOUNTER — Inpatient Hospital Stay: Payer: Medicaid Other | Attending: Hematology

## 2017-06-14 ENCOUNTER — Inpatient Hospital Stay (HOSPITAL_BASED_OUTPATIENT_CLINIC_OR_DEPARTMENT_OTHER): Payer: Medicaid Other | Admitting: Hematology

## 2017-06-14 ENCOUNTER — Telehealth: Payer: Self-pay | Admitting: Pharmacist

## 2017-06-14 ENCOUNTER — Other Ambulatory Visit: Payer: Medicaid Other

## 2017-06-14 VITALS — BP 120/78 | HR 62 | Temp 98.0°F | Resp 18 | Ht 67.0 in | Wt 122.4 lb

## 2017-06-14 DIAGNOSIS — R197 Diarrhea, unspecified: Secondary | ICD-10-CM | POA: Insufficient documentation

## 2017-06-14 DIAGNOSIS — M25552 Pain in left hip: Secondary | ICD-10-CM

## 2017-06-14 DIAGNOSIS — Z170 Estrogen receptor positive status [ER+]: Secondary | ICD-10-CM | POA: Insufficient documentation

## 2017-06-14 DIAGNOSIS — C50911 Malignant neoplasm of unspecified site of right female breast: Secondary | ICD-10-CM

## 2017-06-14 DIAGNOSIS — C50919 Malignant neoplasm of unspecified site of unspecified female breast: Secondary | ICD-10-CM

## 2017-06-14 DIAGNOSIS — G4700 Insomnia, unspecified: Secondary | ICD-10-CM | POA: Insufficient documentation

## 2017-06-14 DIAGNOSIS — C7951 Secondary malignant neoplasm of bone: Principal | ICD-10-CM

## 2017-06-14 DIAGNOSIS — Z79811 Long term (current) use of aromatase inhibitors: Secondary | ICD-10-CM

## 2017-06-14 DIAGNOSIS — C799 Secondary malignant neoplasm of unspecified site: Secondary | ICD-10-CM

## 2017-06-14 DIAGNOSIS — Z923 Personal history of irradiation: Secondary | ICD-10-CM | POA: Diagnosis not present

## 2017-06-14 DIAGNOSIS — Z95828 Presence of other vascular implants and grafts: Secondary | ICD-10-CM

## 2017-06-14 DIAGNOSIS — Z79899 Other long term (current) drug therapy: Secondary | ICD-10-CM | POA: Insufficient documentation

## 2017-06-14 DIAGNOSIS — Z9221 Personal history of antineoplastic chemotherapy: Secondary | ICD-10-CM | POA: Diagnosis not present

## 2017-06-14 DIAGNOSIS — Z5112 Encounter for antineoplastic immunotherapy: Secondary | ICD-10-CM | POA: Insufficient documentation

## 2017-06-14 LAB — COMPREHENSIVE METABOLIC PANEL
ALBUMIN: 3.8 g/dL (ref 3.5–5.0)
ALT: 11 U/L (ref 0–55)
AST: 16 U/L (ref 5–34)
Alkaline Phosphatase: 65 U/L (ref 40–150)
Anion gap: 7 (ref 3–11)
BUN: 11 mg/dL (ref 7–26)
CHLORIDE: 107 mmol/L (ref 98–109)
CO2: 26 mmol/L (ref 22–29)
CREATININE: 1.05 mg/dL (ref 0.60–1.10)
Calcium: 8.9 mg/dL (ref 8.4–10.4)
GFR calc non Af Amer: >60 mL/min (ref >60)
GLUCOSE: 90 mg/dL (ref 70–140)
Potassium: 3.2 mmol/L — ABNORMAL LOW (ref 3.5–5.1)
SODIUM: 140 mmol/L (ref 136–145)
Total Bilirubin: 2.7 mg/dL — ABNORMAL HIGH (ref 0.2–1.2)
Total Protein: 6.8 g/dL (ref 6.4–8.3)

## 2017-06-14 LAB — CBC WITH DIFFERENTIAL/PLATELET
Basophils Absolute: 0 10*3/uL (ref 0.0–0.1)
Basophils Relative: 1 %
EOS ABS: 0 10*3/uL (ref 0.0–0.5)
Eosinophils Relative: 1 %
HCT: 29.7 % — ABNORMAL LOW (ref 34.8–46.6)
HEMOGLOBIN: 10.5 g/dL — AB (ref 11.6–15.9)
LYMPHS ABS: 1.2 10*3/uL (ref 0.9–3.3)
Lymphocytes Relative: 60 %
MCH: 31.8 pg (ref 25.1–34.0)
MCHC: 35.2 g/dL (ref 31.5–36.0)
MCV: 90.3 fL (ref 79.5–101.0)
MONOS PCT: 5 %
Monocytes Absolute: 0.1 10*3/uL (ref 0.1–0.9)
NEUTROS PCT: 33 %
Neutro Abs: 0.7 10*3/uL — ABNORMAL LOW (ref 1.5–6.5)
Platelets: 176 10*3/uL (ref 145–400)
RBC: 3.29 MIL/uL — ABNORMAL LOW (ref 3.70–5.45)
RDW: 15.3 % — ABNORMAL HIGH (ref 11.2–14.5)
WBC: 2 10*3/uL — ABNORMAL LOW (ref 3.9–10.3)

## 2017-06-14 MED ORDER — SODIUM CHLORIDE 0.9 % IV SOLN
6.0000 mg/kg | Freq: Once | INTRAVENOUS | Status: AC
  Administered 2017-06-14: 357 mg via INTRAVENOUS
  Filled 2017-06-14: qty 17

## 2017-06-14 MED ORDER — SODIUM CHLORIDE 0.9 % IJ SOLN
10.0000 mL | INTRAMUSCULAR | Status: DC | PRN
  Administered 2017-06-14: 10 mL via INTRAVENOUS
  Filled 2017-06-14: qty 10

## 2017-06-14 MED ORDER — SODIUM CHLORIDE 0.9 % IV SOLN
Freq: Once | INTRAVENOUS | Status: AC
  Administered 2017-06-14: 09:00:00 via INTRAVENOUS

## 2017-06-14 MED ORDER — ZOLEDRONIC ACID 4 MG/100ML IV SOLN
4.0000 mg | Freq: Once | INTRAVENOUS | Status: AC
  Administered 2017-06-14: 4 mg via INTRAVENOUS
  Filled 2017-06-14: qty 100

## 2017-06-14 MED ORDER — PALBOCICLIB 75 MG PO CAPS: 75.0000 mg | ORAL_CAPSULE | Freq: Every day | ORAL | 0 refills | Status: DC

## 2017-06-14 MED ORDER — LORATADINE 10 MG PO TABS
ORAL_TABLET | ORAL | Status: AC
  Filled 2017-06-14: qty 1

## 2017-06-14 MED ORDER — HEPARIN SOD (PORK) LOCK FLUSH 100 UNIT/ML IV SOLN
500.0000 [IU] | Freq: Once | INTRAVENOUS | Status: AC | PRN
  Administered 2017-06-14: 500 [IU]
  Filled 2017-06-14: qty 5

## 2017-06-14 MED ORDER — ACETAMINOPHEN 325 MG PO TABS
650.0000 mg | ORAL_TABLET | Freq: Once | ORAL | Status: AC
  Administered 2017-06-14: 650 mg via ORAL

## 2017-06-14 MED ORDER — ACETAMINOPHEN 325 MG PO TABS
ORAL_TABLET | ORAL | Status: AC
  Filled 2017-06-14: qty 2

## 2017-06-14 MED ORDER — SODIUM CHLORIDE 0.9% FLUSH
10.0000 mL | INTRAVENOUS | Status: DC | PRN
  Administered 2017-06-14: 10 mL
  Filled 2017-06-14: qty 10

## 2017-06-14 MED ORDER — LORATADINE 10 MG PO TABS
10.0000 mg | ORAL_TABLET | Freq: Every day | ORAL | Status: DC
  Administered 2017-06-14: 10 mg via ORAL

## 2017-06-14 NOTE — Patient Instructions (Signed)
[facility] Discharge Instructions for Patients Receiving Chemotherapy  Today you received the following chemotherapy agents Herceptin, Zometa  To help prevent nausea and vomiting after your treatment, we encourage you to take your nausea medication as directed   If you develop nausea and vomiting that is not controlled by your nausea medication, call the clinic.   BELOW ARE SYMPTOMS THAT SHOULD BE REPORTED IMMEDIATELY:  *FEVER GREATER THAN 100.5 F  *CHILLS WITH OR WITHOUT FEVER  NAUSEA AND VOMITING THAT IS NOT CONTROLLED WITH YOUR NAUSEA MEDICATION  *UNUSUAL SHORTNESS OF BREATH  *UNUSUAL BRUISING OR BLEEDING  TENDERNESS IN MOUTH AND THROAT WITH OR WITHOUT PRESENCE OF ULCERS  *URINARY PROBLEMS  *BOWEL PROBLEMS  UNUSUAL RASH Items with * indicate a potential emergency and should be followed up as soon as possible.  Feel free to call the clinic should you have any questions or concerns. The clinic phone number is [phone].  Please show the [facility] at check-in to the Emergency Department and triage nurse.

## 2017-06-14 NOTE — Telephone Encounter (Signed)
Oral Oncology Pharmacist Encounter  Patient started on Ibrance 100mg  daily for 3 weeks on, 1 week off on 05/31/17  New prescription for Ibrance 75mg  capsules, take 1 capsule by mouth once daily with breakfast. Take for 21 days on 7 days off Quantity #21 Refill 0  E-scribed to [facility]  Lab check today shows ANC 0.7  Leslee Home will be held x 1 week Lab check on 6/10 Plans to re-start Ibrance at reduced dose on 06/21/17 if [place] recovered.  Oral Oncology Clinic will continue to follow.  Johny Drilling, PharmD, BCPS, BCOP  06/14/2017   9:12 AM Oral Oncology Clinic [phone]

## 2017-06-14 NOTE — Progress Notes (Signed)
06/14/17 @ 0920  OK to give Zometa today verses 06/22/17 per Dr Burr Medico.  T.O. Dr Lavonda Jumbo, PharmD

## 2017-06-14 NOTE — Telephone Encounter (Signed)
Scheduled appt per 6/3 los - gave patient AVS and calender per los.

## 2017-06-15 LAB — CANCER ANTIGEN 27.29: CA 27.29: 35.6 U/mL (ref 0.0–38.6)

## 2017-06-21 ENCOUNTER — Inpatient Hospital Stay: Payer: Medicaid Other

## 2017-06-21 ENCOUNTER — Telehealth: Payer: Self-pay | Admitting: *Deleted

## 2017-06-21 ENCOUNTER — Other Ambulatory Visit: Payer: Self-pay | Admitting: Hematology

## 2017-06-21 DIAGNOSIS — C50919 Malignant neoplasm of unspecified site of unspecified female breast: Secondary | ICD-10-CM

## 2017-06-21 DIAGNOSIS — Z95828 Presence of other vascular implants and grafts: Secondary | ICD-10-CM

## 2017-06-21 DIAGNOSIS — C7951 Secondary malignant neoplasm of bone: Secondary | ICD-10-CM

## 2017-06-21 DIAGNOSIS — Z5112 Encounter for antineoplastic immunotherapy: Secondary | ICD-10-CM | POA: Diagnosis not present

## 2017-06-21 DIAGNOSIS — C50911 Malignant neoplasm of unspecified site of right female breast: Secondary | ICD-10-CM

## 2017-06-21 DIAGNOSIS — C799 Secondary malignant neoplasm of unspecified site: Principal | ICD-10-CM

## 2017-06-21 LAB — COMPREHENSIVE METABOLIC PANEL
ALT: 11 U/L (ref 0–55)
AST: 16 U/L (ref 5–34)
Albumin: 3.8 g/dL (ref 3.5–5.0)
Alkaline Phosphatase: 67 U/L (ref 40–150)
Anion gap: 8 (ref 3–11)
BILIRUBIN TOTAL: 1.2 mg/dL (ref 0.2–1.2)
BUN: 9 mg/dL (ref 7–26)
CO2: 27 mmol/L (ref 22–29)
Calcium: 9.5 mg/dL (ref 8.4–10.4)
Chloride: 106 mmol/L (ref 98–109)
Creatinine, Ser: 0.88 mg/dL (ref 0.60–1.10)
Glucose, Bld: 88 mg/dL (ref 70–140)
POTASSIUM: 3.6 mmol/L (ref 3.5–5.1)
Sodium: 141 mmol/L (ref 136–145)
TOTAL PROTEIN: 7.1 g/dL (ref 6.4–8.3)

## 2017-06-21 LAB — CBC WITH DIFFERENTIAL/PLATELET
Basophils Absolute: 0 10*3/uL (ref 0.0–0.1)
Basophils Relative: 1 %
Eosinophils Absolute: 0 10*3/uL (ref 0.0–0.5)
Eosinophils Relative: 1 %
HEMATOCRIT: 30.4 % — AB (ref 34.8–46.6)
Hemoglobin: 10.7 g/dL — ABNORMAL LOW (ref 11.6–15.9)
Lymphocytes Relative: 52 %
Lymphs Abs: 1.5 10*3/uL (ref 0.9–3.3)
MCH: 32.7 pg (ref 25.1–34.0)
MCHC: 35.1 g/dL (ref 31.5–36.0)
MCV: 92.9 fL (ref 79.5–101.0)
MONO ABS: 0.2 10*3/uL (ref 0.1–0.9)
MONOS PCT: 7 %
NEUTROS ABS: 1.1 10*3/uL — AB (ref 1.5–6.5)
Neutrophils Relative %: 39 %
Platelets: 119 10*3/uL — ABNORMAL LOW (ref 145–400)
RBC: 3.27 MIL/uL — ABNORMAL LOW (ref 3.70–5.45)
RDW: 20.4 % — AB (ref 11.2–14.5)
WBC: 2.8 10*3/uL — ABNORMAL LOW (ref 3.9–10.3)

## 2017-06-21 MED ORDER — HEPARIN SOD (PORK) LOCK FLUSH 100 UNIT/ML IV SOLN
500.0000 [IU] | Freq: Once | INTRAVENOUS | Status: AC | PRN
  Administered 2017-06-21: 500 [IU] via INTRAVENOUS
  Filled 2017-06-21: qty 5

## 2017-06-21 MED ORDER — SODIUM CHLORIDE 0.9% FLUSH
10.0000 mL | INTRAVENOUS | Status: DC | PRN
  Administered 2017-06-21: 10 mL via INTRAVENOUS
  Filled 2017-06-21: qty 10

## 2017-06-21 MED FILL — IBRANCE 75 MG CAPSULE: 75 | 21 days supply | Qty: 21 | Fill #0

## 2017-06-21 NOTE — Telephone Encounter (Signed)
Called [facility] for refill on Ibrance & they did get script & will have ready for p/u tomorrow.  Notified Arrow's daughter via mobile phone to p/u tomorrow & they were fine with this & to start.

## 2017-06-23 ENCOUNTER — Telehealth: Payer: Self-pay | Admitting: Pharmacist

## 2017-06-23 NOTE — Telephone Encounter (Signed)
Oral Oncology Pharmacist Encounter  Received call from patient with questions about next Ibrance fill. Patient states she had spoken with someone at the [facility] about needing Leslee Home and had not gone by the pharmacy on 6/3 or 6/10 to pick up her fill. Patient states she is not able to afford her copayment until Friday, 06/25/2017.  I called the specialty pharmacy at [facility] and arranged for next fill of Ibrance to be shipped from the pharmacy on 06/23/2017 for delivery to patient's home on 06/24/2017. They are able to wave patient's copayment at this time.  I called patient and informed her of shipment of next Ibrance fill. Patient reminded that the pharmacy will reach out to her 1 week before new fill of Ibrance is needed to schedule either shipment or pick up of the medication. She will be able to discuss next copayment and billing with the pharmacy at that time.  Patient also with questions about decrease in [place] value necessitating an Ibrance dose decrease. Patient with CBC check on 06/21/2017 showing [place] is rebounding. New Ibrance dose of 75 mg daily, 3 weeks on, one-week off, repeat every 4 weeks is appropriate at this time based on current labs.  We discussed risks associated with decrease in neutrophils. Patient with multiple questions about immune system function. All questions answered.  Patient expressed understanding and appreciation. Patient knows to call the office with any additional questions or concerns.  Oral Oncology Clinic will continue to follow.  [name], [name], [name], [name]  06/23/2017 9:41 AM Oral Oncology Clinic [phone]

## 2017-07-01 NOTE — Progress Notes (Signed)
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 She had mastectomy with the 7 axillary node evaluation, 4 cycles of adriamycin/ cytoxan followed by taxotere, then five years of tamoxifen thru June 2005 (no herceptin in 1999). She had bilateral oophorectomy in May 2005. She was briefly on aromatase inhibitor after tamoxifen, but discontinued this herself due to poor tolerance.      04/04/2015 Genetic Testing    Negative genetic testing on the breast/ovarian cancer pnael.  The Breast/Ovarian gene panel offered by [facility] includes sequencing and rearrangement analysis for the following 20 genes:  ATM, BARD1, BRCA1, BRCA2, BRIP1, CDH1, CHEK2, EPCAM, FANCC, MLH1, MSH2, MSH6, NBN, PALB2, PMS2, PTEN, RAD51C, RAD51D, TP53, and XRCC2.      06/26/2015 Pathology Results    Initial Path Report Bone, curettage, Left femur met, intramedullary subtroch tissue - METASTATIC ADENOCARCINOMA.  Estrogen Receptor: 95%, POSITIVE, STRONG STAINING INTENSITY Progesterone Receptor: 2%, POSITIVE, STRONG  HER2 (+), IHC 3+      06/26/2015 Surgery    Biopsy of metastatic tissue and stabilization with Affixus trochanteric nail, proximal and distal interlock of left femur by Dr. [name]       06/28/2015 Imaging    CT Chest w/ Contrast 1. Extensive right internal mammary chain lymphadenopathy consistent with metastatic breast cancer. 2. Lytic metastases involving the posterior elements at  T1 with probable nondisplaced pathologic fracture of the spinous process. 3. No other evidence of thoracic metastatic disease. 4. Trace bilateral pleural effusions with associated bibasilar atelectasis. 5. Indeterminate right thyroid nodule, not previously imaged. This could be evaluated with thyroid ultrasound as clinically warranted.      07/01/2015 Imaging    Bone Scan 1. Increased activity noted throughout the left femur. Although metastatic disease cannot be excluded, these changes are most likely secondary to prior surgery. 2. No other focal abnormalities identified to suggest metastatic disease.      07/12/2015 - 07/26/2015 Radiation Therapy    Left femur, 30 Gy in 10 fractions by Dr. Sondra Come       07/14/2015 Initial Diagnosis    [name], right ([place])      07/19/2015 Imaging    Initial PET Scan 1. Severe multifocal osseous metastatic disease, much greater than anticipated based on prior imaging. 2. Multifocal nodal metastases involving the right internal mammary, prevascular and subpectoral lymph nodes. No axillary pulmonary involvement identified. 3. No distant extra osseous metastases.      07/30/2015 - 11/29/2015 Chemotherapy    Docetaxel, Herceptin and Perjeta every 3 weeks X6 cycles, pt tolerated moderately well, restaging scan showed excellent response       09/18/2015 Imaging    CT Head w/wo Contrast 1. No acute intracranial abnormality or significant interval change. 2. Stable minimal periventricular white matter hypoattenuation on the right. This may reflect a remote ischemic injury. 3. No evidence for metastatic disease to the brain. 4. No focal soft tissue lesion to explain the patient's tenderness.  10/30/2015 Imaging    CT Abdomen Pelvis w/ Contrast 1. Few mildly prominent fluid-filled loops of small bowel scattered throughout the abdomen, with intraluminal fluid density within the distal colon. Given the provided history, findings  are suggestive of acute enteritis/diarrheal illness. 2. No other acute intra-abdominal or pelvic process identified. 3. Widespread osseous metastatic disease, better evaluated on most recent PET-CT from 07/19/2015. No pathologic fracture or other complication. 4. 11 mm hypodensity within the left kidney. This lesion measures intermediate density, and is indeterminate. While this lesion is similar in size relative to recent studies, this is increased in size relative to prior study from 2012. Further evaluation with dedicated renal mass protocol CT and/or MRI is recommended for complete characterization.      12/25/2015 Imaging    Restaging PET Scan 1. Markedly improved skeletal activity, with only several faint foci of residual accentuated metabolic activity, but resolution of the vast majority of the previously extensive osseous metastatic disease. 2. Resolution of the prior right internal mammary and right prevascular lymph nodes. 3. Residual subcutaneous edema and edema along fascia planes in the left upper thigh. This remains somewhat more than I would expect for placement of an IM nail 6 weeks ago. If there is leg swelling further down, consider Doppler venous ultrasound to rule out left lower extremity DVT. I do not perceive an obvious difference in density between the pelvic and common femoral veins on the noncontrast CT data. 4. Chronic right maxillary and right sphenoid sinusitis.      12/2015 -  Chemotherapy    Maintenance Herceptin and pejeta every 3 weeks      01/16/2016 -  Anti-estrogen oral therapy    Letrozole 2.5 mg daily      03/01/2016 Miscellaneous    Patient presented to ED following a fall; she reports she landed on her back and is now experiencing back pain. The patient was evaluated and discharge home same day with pain control.      05/01/2016 Imaging    CT CAP IMPRESSION: 1. Stable exam.  No new or progressive disease identified. 2. No evidence for residual or  recurrent adenopathy within the chest. 3. Stable bone metastasis.      05/01/2016 Imaging    BONE SCAN IMPRESSION: 1. Small focal uptake involving the anterior rib ends of the left second and right fourth ribs, new since the prior bone scan. The location of this uptake is more suggestive of a traumatic or inflammatory etiology as opposed to metastatic disease. No other evidence of metastatic disease. 2. There are other areas of stable uptake which are most likely degenerative/reactive, including right shoulder and cervical spine uptake and uptake along the right femur adjacent to the ORIF hardware.      05/20/2016 Imaging    NM PET Skull to Thigh IMPRESSION: 1. There multiple metastatic foci in the bony pelvis, including some new abnormal foci compare to the prior PET-CT, compatible with mildly progressive bony metastatic disease. Also, a left T11 vertebral hypermetabolic metastatic lesion is observed, new compared to the prior exam. 2. No active extraosseous malignancy is currently identified. 3. Right anterior fourth rib healing fracture, appears be      06/04/2016 -  Chemotherapy    Second line chemotherapy Kadcyla every 3 weeks started on 06/04/16       10/05/2016 Imaging    CT CAP and Bone Scan:  Bones: left intramedullary rod and left femoral head neck screw in place. In the proximal diaphysis of the left femur  there is cortical thickening and irregularity. No lytic or blastic bone lesions. No fracture or vertebral endplate destruction.   No definite evidence of recurrent disease in the chest, abdomen, or pelvis.      10/28/2016 Imaging    CT A/P IMPRESSION: No acute process demonstrated in the abdomen or pelvis. No evidence of bowel obstruction or inflammation.      11/04/2016 Imaging    DG foot complete left: IMPRESSION: No fracture or dislocation.  No soft tissue abnormality      11/23/2016 Imaging    CT RIGHT FEMUR IMPRESSION: Negative CT scan of the  right thigh.  No visible metastatic disease.  CT LEFT FEMUR IMPRESSION: 1. Postsurgical changes related to prior cephalomedullary rod fixation of the left femur with linear lucency along the anterolateral proximal femoral cortex at level of the lesser trochanter, suspicious for nondisplaced fracture. 2. Slightly permeative appearance of the proximal left femoral cortex at the site of known prior osseous metastases is largely unchanged. 3. Unchanged patchy lucency and sclerosis along the anterior acetabulum, which appears to correspond with an area of faint uptake on the prior PET-CT, suspicious for osseous metastasis. These results will be called to the ordering clinician or representative by the Radiologist Assistant, and communication documented in the PACS or zVision Dashboard.        01/27/2017 Imaging    IMPRESSION: 1. Mixed response to osseous metastasis. Some hypermetabolic lesions are new and increasingly hypermetabolic, while another index lesion is decreasingly hypermetabolic. 2. No evidence of hypermetabolic soft tissue metastasis.       04/30/2017 PET scan    IMPRESSION: 1. Primarily increase in hypermetabolism of osseous metastasis. An isolated right acetabular lesion is decreased in hypermetabolism since the prior exam. 2. No evidence of hypermetabolic soft tissue metastasis. 3. Similar non FDG avid right-sided thyroid nodule, favoring a benign etiology. Recommend attention on follow-up.       HISTORY OF PRESENTING ILLNESS (From Dr. Mariana Kaufman note on 02/06/2016):  [name] 52 y.o. 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 She has had an excellent response to 6 cycles of taxotere herceptin perjeta from 07-30-15 thru 11-29-15. She is continuing herceptin and perjeta every 3 weeks. She had extreme local pain with first xgeva injection on 01-16-16 and prefers  resuming previous zometa. She began letrozole ~ 01-16-16, has no complaints about this at all.  Last imaging was PET 12-25-15.  NOTE CTs, plain Xrays and bone scan did NOT show any of the extensively metastatic bone involvement 06-2015; MRI and PET did image disease clearly.  Patient continues to do very well overall. MRSA skin lesions on scalp and face improved quickly with doxycycline starting 01-16-16. She continues to use Dial soap and we have again strongly encouraged good hygiene with other family members at home. She has had the 3 lymphedema PT sessions allowed by insurance, continues to do massage for LLE herself, still has not gotten the ordered compression apparatus for home and will follow up with PT on that. Swelling in LLE has improved with the interventions and does not seem as uncomfortable. She denies any pain. Appetite is excellent, bowels fine, no SOB, good energy, no problems with PAC, no noted changes in left breast. No bleeding. No fever or symptoms of infection. She slipped in tub last week, fell onto right arm fortunately without injury. She was upset by a visitor and shaky then, but no tremors otherwise.  She has two children and 4 grandchildren. She has had genetic testing and it was negative. She lives at home with her husband. She is very active. Her children live close to her.   CURRENT THERAPY:    Exemestane 6m daily and herceptin every 3 weeks, Ibrance 1037mstarted on 05/31/17. Held for 1 week on 06/14/17 due to pancytopenia. Plan to restart her at 7540maily, 3 weeks on and one week off.   INTERIM HISTORY:  [name] 63o. female returns today for follow up and ongoing treatment. She presents to the clinic today with her interpreter. She feels well generally. Her main problem is with potassium pill, that choked her. She was advised by the interpreter to crush the pill and take it with food.   She's on her 2nd week of Ibrance. Ibrance caused mild diarrhea and  scalp discomfort, that she tolerates and is not a major problem.  She still has leg cramps. Recent history of long travel to [place] sat for 5 hours.  The patient is accompanied by an American Sign Language Interpretor today, who the patient has elected to translate for her during this visit.    MEDICAL HISTORY:  Past Medical History:  Diagnosis Date  . Abnormal Pap smear   . Anxiety   . [place]   breast-s/p mastectomy, chemo, rad  . CIN I (cervical intraepithelial neoplasia I) 2003   by colpo  . Congenital deafness   . Deaf   . Depression   . Radiation 07/12/15-07/26/15   left femur 30 Gy  . S/P bilateral oophorectomy     SURGICAL HISTORY: Past Surgical History:  Procedure Laterality Date  . ABDOMINAL HYSTERECTOMY    . INTRAMEDULLARY (IM) NAIL INTERTROCHANTERIC Left 06/26/2015   Procedure: LEFT BIOMET LONG AFFIXS NAIL;  Surgeon: MarMarybelle KillingsD;  [facility];  Laterality: Left;  . IR GENERIC HISTORICAL  08/06/2015   IR US [place]IDE VASC ACCESS LEFT 08/06/2015 GleAletta EdouardD [facility]INTER[facility]  . IR GENERIC HISTORICAL  08/06/2015   IR FLUORO GUIDE CV LINE LEFT 08/06/2015 GleAletta EdouardD [facility]  . IR GENERIC HISTORICAL  03/05/2016   IR CV LINE INJECTION 03/05/2016 AdaMarkus DaftD [facility]INTER[facility]  . MASTECTOMY     right breast  . MASTECTOMY    . OVARIAN CYST REMOVAL    . RADIOLOGY WITH ANESTHESIA Left 06/25/2015   Procedure: MRI OF LEFT HIP WITH OR WITHOUT CONTRAST;  Surgeon: Medication Radiologist, MD;  [facility];  Laterality: Left;  DR. MCINTYRE/MRI  . TUBAL LIGATION      SOCIAL HISTORY: Social History   Socioeconomic History  . Marital status: Married    Spouse name: Not on file  . Number of children: 2  . Years of education: Not on file  . Highest education level: Not on file  Occupational History  . Not on file  Social Needs  . Financial resource strain: Not on file  . Food  insecurity:    Worry: Not on file    Inability: Not on file  . Transportation needs:    Medical: Not on file    Non-medical: Not on file  Tobacco Use  . Smoking status: Never Smoker  . Smokeless tobacco: Never Used  Substance and Sexual Activity  . Alcohol use: No  . Drug use: No  . Sexual activity: Yes    Birth control/protection: Surgical  Lifestyle  . Physical activity:    Days per week: Not on file    Minutes per session: Not on file  . Stress: Not on file  Relationships  . Social connections:    Talks on phone: Not on file    Gets together: Not on file    Attends religious service: Not on file    Active member of club or organization: Not on file    Attends meetings of clubs or organizations: Not on file    Relationship status: Not on file  . Intimate partner violence:    Fear of current or ex partner: Not on file    Emotionally abused: Not on file    Physically abused: Not on file    Forced sexual activity: Not on file  Other Topics Concern  . Not on file  Social History Narrative  . Not on file    FAMILY HISTORY: Family History  Problem Relation Age of Onset  . Hypertension Mother   . Seizures Mother   . Alzheimer's disease Maternal Uncle   . Pancreatitis Maternal Grandmother   . Heart attack Maternal Grandfather     ALLERGIES:  is allergic to chlorhexidine; promethazine hcl; and diphenhydramine hcl.  MEDICATIONS:  Current Outpatient Medications  Medication Sig Dispense Refill  . Cholecalciferol 2000 units CAPS Take 1 capsule by mouth daily.    . divalproex (DEPAKOTE) 250 MG DR tablet Take 1 tablet (250 mg total) by mouth at bedtime. 30 tablet 2  . exemestane (AROMASIN) 25 MG tablet Take 1 tablet (25 mg total) by mouth daily after breakfast. 30 tablet 3  . HYDROcodone-acetaminophen (NORCO) 10-325 MG tablet Take 1 tablet by mouth every 6 (six) hours as needed. 45 tablet 0  . lidocaine-prilocaine (EMLA) cream Apply 1 application as needed topically. Apply  to Porta-Cath 1-2 hours prior to access as directed. 30 g 2  . potassium chloride (K-DUR) 10 MEQ tablet Take 10 mEq by mouth daily. Take 2 tablets daily.    . prochlorperazine (COMPAZINE) 10 MG tablet Take 1 tablet (10 mg total) by mouth every 6 (six) hours as needed for nausea or vomiting. 30 tablet 2  . palbociclib (IBRANCE) 75 MG capsule Take 1 capsule (75 mg total) by mouth daily with breakfast. Take whole with food. Take for 21 days on, 7 days off, repeat every 28 days. 21 capsule 0   No current facility-administered medications for this visit.    Facility-Administered Medications Ordered in Other Visits  Medication Dose Route Frequency Provider Last Rate Last Dose  . heparin lock flush 100 unit/mL  500 Units Intracatheter Once PRN Truitt Merle, MD      . sodium chloride 0.9 % 1,000 mL with potassium chloride 10 mEq infusion   Intravenous Continuous Gordy Levan, MD   Stopped at 09/10/15 1653  . sodium chloride 0.9 % injection 10 mL  10 mL Intravenous PRN Livesay, Lennis P, MD      . sodium chloride flush (NS) 0.9 % injection 10 mL  10 mL Intracatheter PRN Truitt Merle, MD      . trastuzumab (HERCEPTIN) 231 mg in sodium chloride 0.9 % 250 mL chemo infusion  4 mg/kg (Treatment Plan Recorded) Intravenous Once Truitt Merle, MD        REVIEW OF SYSTEMS:  Constitutional: Denies fevers, chills or abnormal night sweats , low appetite from stress , weight stable Eyes: Denies blurriness of vision, double vision or watery eyes Ears, nose, mouth, throat, and face: Denies mucositis  or sore throat (+) deaf Respiratory: Denies cough, dyspnea or wheezes Cardiovascular: Denies palpitation, chest discomfort or lower extremity swelling Skin: (+) Scalp discomfort Gastrointestinal:  Denies nausea (+) very mild diarrhea Lymphatics: Denies new lymphadenopathy or easy bruising. MSK: (+) Leg cramps (+) Mild hip pain from previous surgery Behavioral/Psych: Normal All other systems were reviewed with the patient  and are negative.  PHYSICAL EXAMINATION: ECOG PERFORMANCE STATUS: 1 BP 128/70 (BP Location: Left Arm, Patient Position: Sitting)   Pulse 60   Temp 97.6 F (36.4 C) (Oral)   Resp 18   Ht _0  (1.702 m)   Wt 123 lb 3.2 oz (55.9 kg)   SpO2 100%   BMI 19.30 kg/m    GENERAL:alert, no distress and comfortable  EYES: normal, conjunctiva are pink and non-injected, sclera clear OROPHARYNX:no exudate, no erythema and lips, buccal mucosa, and tongue normal  NECK: supple, thyroid normal size, non-tender, without nodularity LYMPH:  no palpable lymphadenopathy in the cervical, axillary or inguinal LUNGS: clear to auscultation and percussion with normal breathing effort HEART: regular rate & rhythm and no murmurs and no lower extremity edema ABDOMEN:abdomen soft, non-tender and normal bowel sounds. No organomegaly. Musculoskeletal:no cyanosis of digits and no clubbing  PSYCH: alert & oriented x 3 with fluent speech NEURO: no focal motor/sensory deficits Breasts: Breast inspection showed Right breast is surgically absent status post reconstruction. Palpation of the breasts and axilla revealed no obvious mass that I could appreciate.   LABORATORY DATA:  I have reviewed the data as listed CBC Latest Ref Rng & Units 07/05/2017 06/21/2017 06/14/2017  WBC 3.9 - 10.3 K/uL 2.3(L) 2.8(L) 2.0(L)  Hemoglobin 11.6 - 15.9 g/dL 10.9(L) 10.7(L) 10.5(L)  Hematocrit 34.8 - 46.6 % 30.9(L) 30.4(L) 29.7(L)  Platelets 145 - 400 K/uL 235 119(L) 176   CMP Latest Ref Rng & Units 07/05/2017 06/21/2017 06/14/2017  Glucose 70 - 140 mg/dL 86 88 90  BUN 7 - 26 mg/dL _1 Creatinine 0.60 - 1.10 mg/dL 0.96 0.88 1.05  Sodium 136 - 145 mmol/L 139 141 140  Potassium 3.5 - 5.1 mmol/L 3.2(L) 3.6 3.2(L)  Chloride 98 - 109 mmol/L 104 106 107  CO2 22 - 29 mmol/L _2 Calcium 8.4 - 10.4 mg/dL 9.2 9.5 8.9  Total Protein 6.4 - 8.3 g/dL 6.8 7.1 6.8  Total Bilirubin 0.2 - 1.2 mg/dL 2.4(H) 1.2 2.7(H)  Alkaline Phos 40 - 150  U/L 57 67 65  AST 5 - 34 U/L _3 ALT 0 - 55 U/L _4 ANC 0.8K today   PROCEDURES:  Echo 04/22/2017 LV EF: 55% -   60%  ------------------------------------------------------------------- Indications:      Breast cancer (C50.911). Global strain performed on Epiq 7 firmware 2.0.2.  ------------------------------------------------------------------- History:   Risk factors:  Breast cancer. Breast reconstruction. Anemia.  ------------------------------------------------------------------- Study Conclusions  - Left ventricle: The cavity size was normal. Systolic function was   normal. The estimated ejection fraction was in the range of 55%   to 60%. Wall motion was normal; there were no regional wall   motion abnormalities. Doppler parameters are consistent with   abnormal left ventricular relaxation (grade 1 diastolic   dysfunction). Doppler parameters are consistent with   indeterminate ventricular filling pressure. - Aortic valve: Transvalvular velocity was within the normal range.   There was no stenosis. There was no regurgitation. - Mitral valve: Transvalvular velocity was within the normal range.   There was no evidence for  stenosis. There was trivial   regurgitation. - Right ventricle: The cavity size was normal. Wall thickness was   normal. Systolic function was normal. - Atrial septum: No defect or patent foramen ovale was identified. - Tricuspid valve: There was mild regurgitation. - Pulmonary arteries: Systolic pressure was within the normal   range. PA peak pressure: 24 mm Hg (S). - Global longitudinal strain -16.7% (mildly abnormal). Slight   change from 12/2016.   RADIOGRAPHIC STUDIES:  PET Scan 04/30/17 IMPRESSION: 1. Primarily increase in hypermetabolism of osseous metastasis. An isolated right acetabular lesion is decreased in hypermetabolism since the prior exam. 2. No evidence of hypermetabolic soft tissue metastasis. 3. Similar non FDG  avid right-sided thyroid nodule, favoring a benign etiology. Recommend attention on follow-up.  PET SCAN: 01/27/17 IMPRESSION: 1. Mixed response to osseous metastasis. Some hypermetabolic lesions are new and increasingly hypermetabolic, while another index lesion is decreasingly hypermetabolic. 2. No evidence of hypermetabolic soft tissue metastasis.  CT A/P IMPRESSION: 10/28/16 No acute process demonstrated in the abdomen or pelvis. No evidence of bowel obstruction or inflammation.  CT CAP and Bone Scan: 10/05/16  Bones: left intramedullary rod and left femoral head neck screw in place. In the proximal diaphysis of the left femur there is cortical thickening and irregularity. No lytic or blastic bone lesions. No fracture or vertebral endplate destruction.  No definite evidence of recurrent disease in the chest, abdomen, or pelvis.  PET 05/20/2016 IMPRESSION: 1. There multiple metastatic foci in the bony pelvis, including some new abnormal foci compare to the prior PET-CT, compatible with mildly progressive bony metastatic disease. Also, a left T11 vertebral hypermetabolic metastatic lesion is observed, new compared to the prior exam. 2. No active extraosseous malignancy is currently identified. 3. Right anterior fourth rib healing fracture, appears benign.  NM Bone Scan 05/01/2016 IMPRESSION: 1. Small focal uptake involving the anterior rib ends of the left second and right fourth ribs, new since the prior bone scan. The location of this uptake is more suggestive of a traumatic or inflammatory etiology as opposed to metastatic disease. No other evidence of metastatic disease. 2. There are other areas of stable uptake which are most likely degenerative/reactive, including right shoulder and cervical spine uptake and uptake along the right femur adjacent to the ORIF Hardware.  CT CAP 05/01/2016 IMPRESSION: 1. Stable exam.  No new or progressive disease identified. 2. No evidence for residual  or recurrent adenopathy within the chest. 3. Stable bone metastasis.  I have personally reviewed the radiological images as listed and agreed with the findings in the report. No results found.   ASSESSMENT & PLAN:  [name] is a 52 y.o. postmenopause female with:  1. 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 -Restaging PET 12-25-15 markedly improved of bone metastasis after initial systemic chemo  -she was switched to letrozole and maintenance Herceptin and the perjeta on 01-16-16.  -Did not tolerate SQ xgeva and it was changed to zometa  -Echocardiogram 01-27-16 showed normal EF, will continue monitoring every 3 months when she is on herceptin -I reviewed her CT scan and bone scan from 05/01/2016, which showed stable disease overall, a few new uptake of anterior ribs on bone scan are indeterminate. -She has developed worsening left hip pain, and more fatigued lately. I have clinical concern of disease progression. -I previously  discussed her restaging PET scan from 05/20/2016.  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 She declined other chemo.  -Labs reviewed, her bilirubin is slightly elevated but stable, adequate to continue treatment. 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  -She moved to [facility] for 3 months, back to [place] now  -We received records from [facility] in [place], [place]. She underwent CT CAP w contrast on 10/02/16 and bone scan on 09/21/16. CT shows no evidence of recurrent disease in the chest, abdomen, or pelvis; Bone scan indicates no lytic or blastic bone lesions.  -She has been on Zometa every 4-6 weeks since June 2017, will change to every 3 months -PET Scan from 01/27/17 images were  reviewed in person, it shows a mixed response to osseous metastasis, most bone lesions have improved comparing to 05/2016, but she has developed one new spine metastasis, and involved her pelvic bone lesion slightly increased in metabolism.  No other visceral organ metastasis. She is clinically doing well.  -Kadcyla was switched to Exemestane, oral HER2 antibody treatment lapatinib and herceptin.  -She had severe side effects of diarrhea, depression and low appetite with lapatinib on full dose of 1500 mg after 1 week and she stopped. I recommended her to restart with low dose, and she can only tolerate low dose 500 mg of lapatinib, which she has been on -Her PET Scan from 04/30/17 revealed a primarily increase in hypermetabolism of osseous metastasis. There are no new lesions.  I reviewed the images and compared to the previous images in person with patient and her husband. I suspect her progression is due to her not being able to fully tolerate lapatinib -I recommend that we change her treatment. I discussed options with clinical trial for the new Her2 antibody conjugate Trastuzumab deruxtecan, the phase 3 clinical trial has been open, and the closest site is in [place] and [employer] . She is not interested in going to [place] at this time. --She started Verzenio 116m on 05/04/17 she has not tolerated well given her diarrhea, headaches, taste change and low appetite from taste change.  -I switched her to Ibrance 1073mand she started on 05/31/17. She has tolerated well with mild diarrhea and some skin ithcing.  -Due to her neutropenia, Ibrance dose reduced to 75 mg daily, 3 weeks on, one-week off -Labs reviewed, ANC 0.8K today, she is on the second week of Ibrance.  We will continue, and repeat her CBC before she starts next cycle Ibrance.  If she has persistent and severe neutropenia, I may change Ibrance to 2 weeks on, one-week off. -Continue Exemestane, tolerating well.  -She is tolerating Herceptin  well, will change the dose to 39m38mg today, to synchronize her cycle with Ibrance -I encouraged her to continue to drink plenty of water.  - Advised to avoid sick contacts and large crowds due to low WBC count. Educated about higher risk of infection. -plan to repeat PET in 08/2017   2. History of MRSA skin infection scalp, boil at low back scalp and upper lip -I prescribed doxycycline on 04/24/16 for 7 days  - Resolved now.  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  -Husband also deaf.  4. premature surgical menopause -post BSO [id number] pain and ovarian cysts, likely was also beneficial from standpoint of the breast cancer. -hysterectomy.   5. Intermittent anemia  -mild, possible related to prior chemo -  She is asymptomatic, we'll continue close monitoring -Hg decreased due to Ibrance, at 10.5 today (06/14/17).   6. Hypokalemia  -She has had intermittent hypokalemia -She is now on potassium 20 mEq  -Potassium 3.2 this morning, she has not taken potassium due to choking. Pt. Advised to crush pill and take with food. continue 2 pills ever other day.  7. Goal of care discussion  -We previously discussed the incurable nature of her cancer, and the overall poor prognosis, especially if she does not have good response to chemotherapy or progress on chemo.  -The patient understands the goal of care is palliative. -she is full code for now   8. Bilateral Leg Pain -she initially presented with worsening left leg and hip pain secondary to bone mets, she had surgery before, this was much improved after her prior chemo - The patient previously took Hydrocodone for this pain, which helped. - I refilled Hydrocodone previously on 5/3. She will take 1 tablet every 6 hours as needed for pain. -Pain has recurred. Recent PET scan showed minimum uptake, unlikely related to bone mets progression  - I previously encouraged her to call Dr. [name] about the pain in her  left leg. She has followed-up  With him.  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   -She notes she has occasional leg cramps, tolerated. She is taking oral calcium.    9. Insomnia -I give her Ambien 48m, but it did not work well.  -continue ativan 1 mg to take at night as needed  -mirtazapine did not work, she also tried trazodone from PCP which gave her a rash. She stopped taking it.   10.  Hyperbilirubinemia  -She has developed mild hyperbilirubinemia recently, TBIL 1.2-2.7, okay to continue treatment  -Her total bilirubin went down to 1.2 2 weeks ago, and slightly increased to 2.4 again today, likely treatment related, will continue monitoring. -If she has worsening and persistent hyperbilirubinemia, I will scan her sooner to rule out liver metastasis.     Plan: -Lab reviewed, adequate for treatment, will proceed Herceptin today with dose of 4 mg/kg, next treatment in 2 weeks -She started to do cycle Ibrance on June 22, 2017, mild neutropenia, will continue to complete a 3 weeks treatment -I encouraged her to continue potassium chloride 10 mEq daily or 20 mEq a day  -lab, flush, f/u and herceptin in 2 weeks    Pt had many questions. All questions were answered. The patient knows to call the clinic with any problems, questions or concerns.  I spent 25 minutes counseling the patient face to face. The total time spent in the appointment was 30 minutes and more than 50% was on counseling.  IDierdre SearlesDweik am acting as scribe for Dr. YTruitt Merle  I have reviewed the above documentation for accuracy and completeness, and I agree with the above.   YTruitt Merle MD 07/05/2017 11:01 AM

## 2017-07-05 ENCOUNTER — Encounter: Payer: Self-pay | Admitting: Hematology

## 2017-07-05 ENCOUNTER — Inpatient Hospital Stay (HOSPITAL_BASED_OUTPATIENT_CLINIC_OR_DEPARTMENT_OTHER): Payer: Medicaid Other | Admitting: Hematology

## 2017-07-05 ENCOUNTER — Inpatient Hospital Stay: Payer: Medicaid Other

## 2017-07-05 ENCOUNTER — Telehealth: Payer: Self-pay | Admitting: Hematology

## 2017-07-05 VITALS — BP 128/70 | HR 60 | Temp 97.6°F | Resp 18 | Ht 67.0 in | Wt 123.2 lb

## 2017-07-05 DIAGNOSIS — Z79899 Other long term (current) drug therapy: Secondary | ICD-10-CM | POA: Diagnosis not present

## 2017-07-05 DIAGNOSIS — C50911 Malignant neoplasm of unspecified site of right female breast: Secondary | ICD-10-CM

## 2017-07-05 DIAGNOSIS — G4700 Insomnia, unspecified: Secondary | ICD-10-CM

## 2017-07-05 DIAGNOSIS — Z923 Personal history of irradiation: Secondary | ICD-10-CM | POA: Diagnosis not present

## 2017-07-05 DIAGNOSIS — M25552 Pain in left hip: Secondary | ICD-10-CM

## 2017-07-05 DIAGNOSIS — Z9221 Personal history of antineoplastic chemotherapy: Secondary | ICD-10-CM

## 2017-07-05 DIAGNOSIS — Z5112 Encounter for antineoplastic immunotherapy: Secondary | ICD-10-CM | POA: Diagnosis not present

## 2017-07-05 DIAGNOSIS — Z170 Estrogen receptor positive status [ER+]: Secondary | ICD-10-CM | POA: Diagnosis not present

## 2017-07-05 DIAGNOSIS — C799 Secondary malignant neoplasm of unspecified site: Principal | ICD-10-CM

## 2017-07-05 DIAGNOSIS — Z79811 Long term (current) use of aromatase inhibitors: Secondary | ICD-10-CM

## 2017-07-05 DIAGNOSIS — E876 Hypokalemia: Secondary | ICD-10-CM

## 2017-07-05 DIAGNOSIS — C7951 Secondary malignant neoplasm of bone: Secondary | ICD-10-CM | POA: Diagnosis not present

## 2017-07-05 DIAGNOSIS — R197 Diarrhea, unspecified: Secondary | ICD-10-CM

## 2017-07-05 DIAGNOSIS — C50919 Malignant neoplasm of unspecified site of unspecified female breast: Secondary | ICD-10-CM

## 2017-07-05 DIAGNOSIS — Z95828 Presence of other vascular implants and grafts: Secondary | ICD-10-CM

## 2017-07-05 LAB — CBC WITH DIFFERENTIAL/PLATELET
Basophils Absolute: 0 10*3/uL (ref 0.0–0.1)
Basophils Relative: 1 %
EOS ABS: 0 10*3/uL (ref 0.0–0.5)
Eosinophils Relative: 2 %
HEMATOCRIT: 30.9 % — AB (ref 34.8–46.6)
HEMOGLOBIN: 10.9 g/dL — AB (ref 11.6–15.9)
LYMPHS ABS: 1.3 10*3/uL (ref 0.9–3.3)
LYMPHS PCT: 54 %
MCH: 34.2 pg — AB (ref 25.1–34.0)
MCHC: 35.2 g/dL (ref 31.5–36.0)
MCV: 97.3 fL (ref 79.5–101.0)
Monocytes Absolute: 0.2 10*3/uL (ref 0.1–0.9)
Monocytes Relative: 8 %
NEUTROS PCT: 35 %
Neutro Abs: 0.8 10*3/uL — ABNORMAL LOW (ref 1.5–6.5)
Platelets: 235 10*3/uL (ref 145–400)
RBC: 3.17 MIL/uL — AB (ref 3.70–5.45)
RDW: 21.9 % — ABNORMAL HIGH (ref 11.2–14.5)
WBC: 2.3 10*3/uL — AB (ref 3.9–10.3)

## 2017-07-05 LAB — COMPREHENSIVE METABOLIC PANEL
ALT: 9 U/L (ref 0–55)
AST: 16 U/L (ref 5–34)
Albumin: 3.8 g/dL (ref 3.5–5.0)
Alkaline Phosphatase: 57 U/L (ref 40–150)
Anion gap: 8 (ref 3–11)
BUN: 12 mg/dL (ref 7–26)
CHLORIDE: 104 mmol/L (ref 98–109)
CO2: 27 mmol/L (ref 22–29)
CREATININE: 0.96 mg/dL (ref 0.60–1.10)
Calcium: 9.2 mg/dL (ref 8.4–10.4)
GFR calc non Af Amer: >60 mL/min (ref >60)
Glucose, Bld: 86 mg/dL (ref 70–140)
Potassium: 3.2 mmol/L — ABNORMAL LOW (ref 3.5–5.1)
SODIUM: 139 mmol/L (ref 136–145)
Total Bilirubin: 2.4 mg/dL — ABNORMAL HIGH (ref 0.2–1.2)
Total Protein: 6.8 g/dL (ref 6.4–8.3)

## 2017-07-05 MED ORDER — LORATADINE 10 MG PO TABS
ORAL_TABLET | ORAL | Status: AC
  Filled 2017-07-05: qty 1

## 2017-07-05 MED ORDER — ACETAMINOPHEN 325 MG PO TABS
650.0000 mg | ORAL_TABLET | Freq: Once | ORAL | Status: AC
  Administered 2017-07-05: 650 mg via ORAL

## 2017-07-05 MED ORDER — SODIUM CHLORIDE 0.9% FLUSH
10.0000 mL | INTRAVENOUS | Status: DC | PRN
  Administered 2017-07-05: 10 mL
  Filled 2017-07-05: qty 10

## 2017-07-05 MED ORDER — ACETAMINOPHEN 325 MG PO TABS
ORAL_TABLET | ORAL | Status: AC
  Filled 2017-07-05: qty 2

## 2017-07-05 MED ORDER — SODIUM CHLORIDE 0.9 % IJ SOLN
10.0000 mL | INTRAMUSCULAR | Status: DC | PRN
  Administered 2017-07-05: 10 mL via INTRAVENOUS
  Filled 2017-07-05: qty 10

## 2017-07-05 MED ORDER — LORATADINE 10 MG PO TABS
10.0000 mg | ORAL_TABLET | Freq: Once | ORAL | Status: AC
  Administered 2017-07-05: 10 mg via ORAL

## 2017-07-05 MED ORDER — TRASTUZUMAB CHEMO 150 MG IV SOLR
4.0000 mg/kg | Freq: Once | INTRAVENOUS | Status: AC
  Administered 2017-07-05: 231 mg via INTRAVENOUS
  Filled 2017-07-05: qty 11

## 2017-07-05 MED ORDER — HEPARIN SOD (PORK) LOCK FLUSH 100 UNIT/ML IV SOLN
500.0000 [IU] | Freq: Once | INTRAVENOUS | Status: AC | PRN
  Administered 2017-07-05: 500 [IU]
  Filled 2017-07-05: qty 5

## 2017-07-05 MED ORDER — SODIUM CHLORIDE 0.9 % IV SOLN
Freq: Once | INTRAVENOUS | Status: AC
  Administered 2017-07-05: 10:00:00 via INTRAVENOUS

## 2017-07-05 NOTE — Patient Instructions (Signed)
Implanted Port Home Guide An implanted port is a type of central line that is placed under the skin. Central lines are used to provide IV access when treatment or nutrition needs to be given through a person's veins. Implanted ports are used for long-term IV access. An implanted port may be placed because:  You need IV medicine that would be irritating to the small veins in your hands or arms.  You need long-term IV medicines, such as antibiotics.  You need IV nutrition for a long period.  You need frequent blood draws for lab tests.  You need dialysis.  Implanted ports are usually placed in the chest area, but they can also be placed in the upper arm, the abdomen, or the leg. An implanted port has two main parts:  Reservoir. The reservoir is round and will appear as a small, raised area under your skin. The reservoir is the part where a needle is inserted to give medicines or draw blood.  Catheter. The catheter is a thin, flexible tube that extends from the reservoir. The catheter is placed into a large vein. Medicine that is inserted into the reservoir goes into the catheter and then into the vein.  How will I care for my incision site? Do not get the incision site wet. Bathe or shower as directed by your health care provider. How is my port accessed? Special steps must be taken to access the port:  Before the port is accessed, a numbing cream can be placed on the skin. This helps numb the skin over the port site.  Your health care provider uses a sterile technique to access the port. ? Your health care provider must put on a mask and sterile gloves. ? The skin over your port is cleaned carefully with an antiseptic and allowed to dry. ? The port is gently pinched between sterile gloves, and a needle is inserted into the port.  Only "non-coring" port needles should be used to access the port. Once the port is accessed, a blood return should be checked. This helps ensure that the port  is in the vein and is not clogged.  If your port needs to remain accessed for a constant infusion, a clear (transparent) bandage will be placed over the needle site. The bandage and needle will need to be changed every week, or as directed by your health care provider.  Keep the bandage covering the needle clean and dry. Do not get it wet. Follow your health care provider's instructions on how to take a shower or bath while the port is accessed.  If your port does not need to stay accessed, no bandage is needed over the port.  What is flushing? Flushing helps keep the port from getting clogged. Follow your health care provider's instructions on how and when to flush the port. Ports are usually flushed with saline solution or a medicine called heparin. The need for flushing will depend on how the port is used.  If the port is used for intermittent medicines or blood draws, the port will need to be flushed: ? After medicines have been given. ? After blood has been drawn. ? As part of routine maintenance.  If a constant infusion is running, the port may not need to be flushed.  How long will my port stay implanted? The port can stay in for as long as your health care provider thinks it is needed. When it is time for the port to come out, surgery will be   done to remove it. The procedure is similar to the one performed when the port was put in. When should I seek immediate medical care? When you have an implanted port, you should seek immediate medical care if:  You notice a bad smell coming from the incision site.  You have swelling, redness, or drainage at the incision site.  You have more swelling or pain at the port site or the surrounding area.  You have a fever that is not controlled with medicine.  This information is not intended to replace advice given to you by your health care provider. Make sure you discuss any questions you have with your health care provider. Document  Released: 12/29/2004 Document Revised: 06/06/2015 Document Reviewed: 09/05/2012 Elsevier Interactive Patient Education  2017 [employer]  

## 2017-07-05 NOTE — Patient Instructions (Signed)
[facility] Discharge Instructions for Patients Receiving Chemotherapy Today you received the following chemotherapy agents:  Herceptin To help prevent nausea and vomiting after your treatment, we encourage you to take your nausea medication as prescribed.   If you develop nausea and vomiting that is not controlled by your nausea medication, call the clinic.   BELOW ARE SYMPTOMS THAT SHOULD BE REPORTED IMMEDIATELY:  *FEVER GREATER THAN 100.5 F  *CHILLS WITH OR WITHOUT FEVER  NAUSEA AND VOMITING THAT IS NOT CONTROLLED WITH YOUR NAUSEA MEDICATION  *UNUSUAL SHORTNESS OF BREATH  *UNUSUAL BRUISING OR BLEEDING  TENDERNESS IN MOUTH AND THROAT WITH OR WITHOUT PRESENCE OF ULCERS  *URINARY PROBLEMS  *BOWEL PROBLEMS  UNUSUAL RASH Items with * indicate a potential emergency and should be followed up as soon as possible.  Feel free to call the clinic should you have any questions or concerns. The clinic phone number is [phone].  Please show the CHEMO ALERT CARD at check-in to the Emergency Department and triage nurse.   

## 2017-07-05 NOTE — Telephone Encounter (Signed)
Unable to schedule appointments- Cap Day in basket message to Dr Burr Medico regarding f/u appointment per 6/24 los

## 2017-07-05 NOTE — Telephone Encounter (Signed)
Appointments scheduled Letter/Calendar mailed to patient per 6/24 los °

## 2017-07-06 ENCOUNTER — Telehealth: Payer: Self-pay | Admitting: Hematology

## 2017-07-06 NOTE — Telephone Encounter (Signed)
Appointments scheduled Letter calendar mailed to patient per her request per 6/24 los

## 2017-07-14 NOTE — Progress Notes (Signed)
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 She had mastectomy with the 7 axillary node evaluation, 4 cycles of adriamycin/ cytoxan followed by taxotere, then five years of tamoxifen thru June 2005 (no herceptin in 1999). She had bilateral oophorectomy in May 2005. She was briefly on aromatase inhibitor after tamoxifen, but discontinued this herself due to poor tolerance.      04/04/2015 Genetic Testing    Negative genetic testing on the breast/ovarian cancer pnael.  The Breast/Ovarian gene panel offered by [facility] includes sequencing and rearrangement analysis for the following 20 genes:  ATM, BARD1, BRCA1, BRCA2, BRIP1, CDH1, CHEK2, EPCAM, FANCC, MLH1, MSH2, MSH6, NBN, PALB2, PMS2, PTEN, RAD51C, RAD51D, TP53, and XRCC2.      06/26/2015 Pathology Results    Initial Path Report Bone, curettage, Left femur met, intramedullary subtroch tissue - METASTATIC ADENOCARCINOMA.  Estrogen Receptor: 95%, POSITIVE, STRONG STAINING INTENSITY Progesterone Receptor: 2%, POSITIVE, STRONG  HER2 (+), IHC 3+      06/26/2015 Surgery    Biopsy of metastatic tissue and stabilization with Affixus trochanteric nail, proximal and distal interlock of left femur by Dr. [name]       06/28/2015 Imaging    CT Chest w/ Contrast 1. Extensive right internal mammary chain lymphadenopathy consistent with metastatic breast cancer. 2. Lytic metastases involving the posterior elements  at T1 with probable nondisplaced pathologic fracture of the spinous process. 3. No other evidence of thoracic metastatic disease. 4. Trace bilateral pleural effusions with associated bibasilar atelectasis. 5. Indeterminate right thyroid nodule, not previously imaged. This could be evaluated with thyroid ultrasound as clinically warranted.      07/01/2015 Imaging    Bone Scan 1. Increased activity noted throughout the left femur. Although metastatic disease cannot be excluded, these changes are most likely secondary to prior surgery. 2. No other focal abnormalities identified to suggest metastatic disease.      07/12/2015 - 07/26/2015 Radiation Therapy    Left femur, 30 Gy in 10 fractions by Dr. Sondra Come       07/14/2015 Initial Diagnosis    [name], right ([place])      07/19/2015 Imaging    Initial PET Scan 1. Severe multifocal osseous metastatic disease, much greater than anticipated based on prior imaging. 2. Multifocal nodal metastases involving the right internal mammary, prevascular and subpectoral lymph nodes. No axillary pulmonary involvement identified. 3. No distant extra osseous metastases.      07/30/2015 - 11/29/2015 Chemotherapy    Docetaxel, Herceptin and Perjeta every 3 weeks X6 cycles, pt tolerated moderately well, restaging scan showed excellent response       09/18/2015 Imaging    CT Head w/wo Contrast 1. No acute intracranial abnormality or significant interval change. 2. Stable minimal periventricular white matter hypoattenuation on the right. This may reflect a remote ischemic injury. 3. No evidence for metastatic disease to the brain. 4. No focal soft tissue lesion to explain the patient's tenderness.  10/30/2015 Imaging    CT Abdomen Pelvis w/ Contrast 1. Few mildly prominent fluid-filled loops of small bowel scattered throughout the abdomen, with intraluminal fluid density within the distal colon. Given the provided history,  findings are suggestive of acute enteritis/diarrheal illness. 2. No other acute intra-abdominal or pelvic process identified. 3. Widespread osseous metastatic disease, better evaluated on most recent PET-CT from 07/19/2015. No pathologic fracture or other complication. 4. 11 mm hypodensity within the left kidney. This lesion measures intermediate density, and is indeterminate. While this lesion is similar in size relative to recent studies, this is increased in size relative to prior study from 2012. Further evaluation with dedicated renal mass protocol CT and/or MRI is recommended for complete characterization.      12/25/2015 Imaging    Restaging PET Scan 1. Markedly improved skeletal activity, with only several faint foci of residual accentuated metabolic activity, but resolution of the vast majority of the previously extensive osseous metastatic disease. 2. Resolution of the prior right internal mammary and right prevascular lymph nodes. 3. Residual subcutaneous edema and edema along fascia planes in the left upper thigh. This remains somewhat more than I would expect for placement of an IM nail 6 weeks ago. If there is leg swelling further down, consider Doppler venous ultrasound to rule out left lower extremity DVT. I do not perceive an obvious difference in density between the pelvic and common femoral veins on the noncontrast CT data. 4. Chronic right maxillary and right sphenoid sinusitis.      12/2015 -  Chemotherapy    Maintenance Herceptin and pejeta every 3 weeks      01/16/2016 -  Anti-estrogen oral therapy    Letrozole 2.5 mg daily      03/01/2016 Miscellaneous    Patient presented to ED following a fall; she reports she landed on her back and is now experiencing back pain. The patient was evaluated and discharge home same day with pain control.      05/01/2016 Imaging    CT CAP IMPRESSION: 1. Stable exam.  No new or progressive disease identified. 2. No evidence for  residual or recurrent adenopathy within the chest. 3. Stable bone metastasis.      05/01/2016 Imaging    BONE SCAN IMPRESSION: 1. Small focal uptake involving the anterior rib ends of the left second and right fourth ribs, new since the prior bone scan. The location of this uptake is more suggestive of a traumatic or inflammatory etiology as opposed to metastatic disease. No other evidence of metastatic disease. 2. There are other areas of stable uptake which are most likely degenerative/reactive, including right shoulder and cervical spine uptake and uptake along the right femur adjacent to the ORIF hardware.      05/20/2016 Imaging    NM PET Skull to Thigh IMPRESSION: 1. There multiple metastatic foci in the bony pelvis, including some new abnormal foci compare to the prior PET-CT, compatible with mildly progressive bony metastatic disease. Also, a left T11 vertebral hypermetabolic metastatic lesion is observed, new compared to the prior exam. 2. No active extraosseous malignancy is currently identified. 3. Right anterior fourth rib healing fracture, appears be      06/04/2016 -  Chemotherapy    Second line chemotherapy Kadcyla every 3 weeks started on 06/04/16       10/05/2016 Imaging    CT CAP and Bone Scan:  Bones: left intramedullary rod and left femoral head neck screw in place. In the proximal diaphysis of the left femur  there is cortical thickening and irregularity. No lytic or blastic bone lesions. No fracture or vertebral endplate destruction.   No definite evidence of recurrent disease in the chest, abdomen, or pelvis.      10/28/2016 Imaging    CT A/P IMPRESSION: No acute process demonstrated in the abdomen or pelvis. No evidence of bowel obstruction or inflammation.      11/04/2016 Imaging    DG foot complete left: IMPRESSION: No fracture or dislocation.  No soft tissue abnormality      11/23/2016 Imaging    CT RIGHT FEMUR IMPRESSION: Negative CT scan  of the right thigh.  No visible metastatic disease.  CT LEFT FEMUR IMPRESSION: 1. Postsurgical changes related to prior cephalomedullary rod fixation of the left femur with linear lucency along the anterolateral proximal femoral cortex at level of the lesser trochanter, suspicious for nondisplaced fracture. 2. Slightly permeative appearance of the proximal left femoral cortex at the site of known prior osseous metastases is largely unchanged. 3. Unchanged patchy lucency and sclerosis along the anterior acetabulum, which appears to correspond with an area of faint uptake on the prior PET-CT, suspicious for osseous metastasis. These results will be called to the ordering clinician or representative by the Radiologist Assistant, and communication documented in the PACS or zVision Dashboard.        01/27/2017 Imaging    IMPRESSION: 1. Mixed response to osseous metastasis. Some hypermetabolic lesions are new and increasingly hypermetabolic, while another index lesion is decreasingly hypermetabolic. 2. No evidence of hypermetabolic soft tissue metastasis.       04/30/2017 PET scan    IMPRESSION: 1. Primarily increase in hypermetabolism of osseous metastasis. An isolated right acetabular lesion is decreased in hypermetabolism since the prior exam. 2. No evidence of hypermetabolic soft tissue metastasis. 3. Similar non FDG avid right-sided thyroid nodule, favoring a benign etiology. Recommend attention on follow-up.       HISTORY OF PRESENTING ILLNESS (From Dr. Mariana Kaufman note on 02/06/2016):  [name] 52 y.o. 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 She has had an excellent response to 6 cycles of taxotere herceptin perjeta from 07-30-15 thru 11-29-15. She is continuing herceptin and perjeta every 3 weeks. She had extreme local pain with first xgeva injection on 01-16-16 and  prefers resuming previous zometa. She began letrozole ~ 01-16-16, has no complaints about this at all.  Last imaging was PET 12-25-15.  NOTE CTs, plain Xrays and bone scan did NOT show any of the extensively metastatic bone involvement 06-2015; MRI and PET did image disease clearly.  Patient continues to do very well overall. MRSA skin lesions on scalp and face improved quickly with doxycycline starting 01-16-16. She continues to use Dial soap and we have again strongly encouraged good hygiene with other family members at home. She has had the 3 lymphedema PT sessions allowed by insurance, continues to do massage for LLE herself, still has not gotten the ordered compression apparatus for home and will follow up with PT on that. Swelling in LLE has improved with the interventions and does not seem as uncomfortable. She denies any pain. Appetite is excellent, bowels fine, no SOB, good energy, no problems with PAC, no noted changes in left breast. No bleeding. No fever or symptoms of infection. She slipped in tub last week, fell onto right arm fortunately without injury. She was upset by a visitor and shaky then, but no tremors otherwise.  She has two children and 4 grandchildren. She has had genetic testing and it was negative. She lives at home with her husband. She is very active. Her children live close to her.   CURRENT THERAPY:    Exemestane 60m daily and herceptin every 3 weeks, Ibrance 1052mstarted on 05/31/17. Held for 1 week on 06/14/17 due to pancytopenia. Restart her at 7537maily, 3 weeks on and one week off.   INTERIM HISTORY:  WenDANILYNN JEMISON 89o. female returns today for follow up and ongoing treatment. She presents to the infusion room today with her interpreter. She feels good today. She needs her medication refilled. Yesterday was her last Ibrace dose. She didn't have problems with ibrance. Left leg is hurting 5-6/10. She also have stiffness and pain in her right shoulder 6-7/10 and  right leg. She's been lifting heavy objects and mowing. She wants to go back to her activities but is worried about the pain.  She is compliant with her medications including potassium.     MEDICAL HISTORY:  Past Medical History:  Diagnosis Date  . Abnormal Pap smear   . Anxiety   . [place]   breast-s/p mastectomy, chemo, rad  . CIN I (cervical intraepithelial neoplasia I) 2003   by colpo  . Congenital deafness   . Deaf   . Depression   . Radiation 07/12/15-07/26/15   left femur 30 Gy  . S/P bilateral oophorectomy     SURGICAL HISTORY: Past Surgical History:  Procedure Laterality Date  . ABDOMINAL HYSTERECTOMY    . INTRAMEDULLARY (IM) NAIL INTERTROCHANTERIC Left 06/26/2015   Procedure: LEFT BIOMET LONG AFFIXS NAIL;  Surgeon: MarMarybelle KillingsD;  [facility];  Laterality: Left;  . IR GENERIC HISTORICAL  08/06/2015   IR US [place]IDE VASC ACCESS LEFT 08/06/2015 GleAletta EdouardD [facility]INTER[facility] RAD  . IR GENERIC HISTORICAL  08/06/2015   IR FLUORO GUIDE CV LINE LEFT 08/06/2015 GleAletta EdouardD [facility]  . IR GENERIC HISTORICAL  03/05/2016   IR CV LINE INJECTION 03/05/2016 AdaMarkus DaftD [facility]INTER[facility]  . MASTECTOMY     right breast  . MASTECTOMY    . OVARIAN CYST REMOVAL    . RADIOLOGY WITH ANESTHESIA Left 06/25/2015   Procedure: MRI OF LEFT HIP WITH OR WITHOUT CONTRAST;  Surgeon: Medication Radiologist, MD;  [facility];  Laterality: Left;  DR. MCINTYRE/MRI  . TUBAL LIGATION      SOCIAL HISTORY: Social History   Socioeconomic History  . Marital status: Married    Spouse name: Not on file  . Number of children: 2  . Years of education: Not on file  . Highest education level: Not on file  Occupational History  . Not on file  Social Needs  . Financial resource strain: Not on file  . Food insecurity:    Worry: Not on file    Inability: Not on file  . Transportation needs:    Medical: Not on  file    Non-medical: Not on file  Tobacco Use  . Smoking status: Never Smoker  . Smokeless tobacco: Never Used  Substance and Sexual Activity  . Alcohol use: No  . Drug use: No  . Sexual activity: Yes    Birth control/protection: Surgical  Lifestyle  . Physical activity:    Days per week: Not on file    Minutes per session: Not on file  . Stress:  Not on file  Relationships  . Social connections:    Talks on phone: Not on file    Gets together: Not on file    Attends religious service: Not on file    Active member of club or organization: Not on file    Attends meetings of clubs or organizations: Not on file    Relationship status: Not on file  . Intimate partner violence:    Fear of current or ex partner: Not on file    Emotionally abused: Not on file    Physically abused: Not on file    Forced sexual activity: Not on file  Other Topics Concern  . Not on file  Social History Narrative  . Not on file    FAMILY HISTORY: Family History  Problem Relation Age of Onset  . Hypertension Mother   . Seizures Mother   . Alzheimer's disease Maternal Uncle   . Pancreatitis Maternal Grandmother   . Heart attack Maternal Grandfather     ALLERGIES:  is allergic to chlorhexidine; promethazine hcl; and diphenhydramine hcl.  MEDICATIONS:  Current Outpatient Medications  Medication Sig Dispense Refill  . Cholecalciferol 2000 units CAPS Take 1 capsule by mouth daily.    . divalproex (DEPAKOTE) 250 MG DR tablet Take 1 tablet (250 mg total) by mouth at bedtime. 30 tablet 2  . exemestane (AROMASIN) 25 MG tablet Take 1 tablet (25 mg total) by mouth daily after breakfast. 30 tablet 3  . HYDROcodone-acetaminophen (NORCO) 10-325 MG tablet Take 1 tablet by mouth every 6 (six) hours as needed. 45 tablet 0  . lidocaine-prilocaine (EMLA) cream Apply 1 application topically as needed. Apply to Porta-Cath 1-2 hours prior to access as directed. 30 g 2  . palbociclib (IBRANCE) 75 MG capsule TAKE 1  CAPSULE (75 MG TOTAL) BY MOUTH DAILY WITH BREAKFAST. TAKE WHOLE WITH FOOD. TAKE FOR 21 DAYS ON, 7 DAYS OFF, REPEAT EVERY 28 DAYS. 21 capsule 1  . potassium chloride (K-DUR) 10 MEQ tablet Take 10 mEq by mouth daily. Take 2 tablets daily.    . prochlorperazine (COMPAZINE) 10 MG tablet Take 1 tablet (10 mg total) by mouth every 6 (six) hours as needed for nausea or vomiting. 30 tablet 2   No current facility-administered medications for this visit.    Facility-Administered Medications Ordered in Other Visits  Medication Dose Route Frequency Provider Last Rate Last Dose  . sodium chloride 0.9 % 1,000 mL with potassium chloride 10 mEq infusion   Intravenous Continuous Gordy Levan, MD   Stopped at 09/10/15 1653  . sodium chloride 0.9 % injection 10 mL  10 mL Intravenous PRN Livesay, Lennis P, MD        REVIEW OF SYSTEMS:  Constitutional: Denies fevers, chills or abnormal night sweats , low appetite from stress , weight stable Eyes: Denies blurriness of vision, double vision or watery eyes Ears, nose, mouth, throat, and face: Denies mucositis or sore throat (+) deaf Respiratory: Denies cough, dyspnea or wheezes Cardiovascular: Denies palpitation, chest discomfort or lower extremity swelling Skin: No rashes or discolorations Gastrointestinal:  Denies nausea (+) very mild diarrhea  Lymphatics: Denies new lymphadenopathy or easy bruising. MSK: (+) Leg cramps (+) Mild hip pain from previous surgery (+) right shoulder and leg pain and stiffness  Behavioral/Psych: Normal All other systems were reviewed with the patient and are negative.  PHYSICAL EXAMINATION: ECOG PERFORMANCE STATUS: 1  Vitals: BP 128/70 Pulse 60 RR 18 Height 5'7 Weight 123Ibs BMI 128  GENERAL:alert, no distress  and comfortable  EYES: normal, conjunctiva are pink and non-injected, sclera clear OROPHARYNX:no exudate, no erythema and lips, buccal mucosa, and tongue normal  NECK: supple, thyroid normal size, non-tender,  without nodularity LYMPH:  no palpable lymphadenopathy in the cervical, axillary or inguinal LUNGS: clear to auscultation and percussion with normal breathing effort HEART: regular rate & rhythm and no murmurs and no lower extremity edema ABDOMEN:abdomen soft, non-tender and normal bowel sounds. No organomegaly. Musculoskeletal:no cyanosis of digits and no clubbing  PSYCH: alert & oriented x 3 with fluent speech NEURO: no focal motor/sensory deficits Breasts: Breast inspection showed Right breast is surgically absent status post reconstruction. Palpation of the breasts and axilla revealed no obvious mass that I could appreciate.   LABORATORY DATA:  I have reviewed the data as listed CBC Latest Ref Rng & Units 07/19/2017 07/05/2017 06/21/2017  WBC 3.9 - 10.3 K/uL 2.6(L) 2.3(L) 2.8(L)  Hemoglobin 11.6 - 15.9 g/dL 10.5(L) 10.9(L) 10.7(L)  Hematocrit 34.8 - 46.6 % 29.7(L) 30.9(L) 30.4(L)  Platelets 145 - 400 K/uL 210 235 119(L)   CMP Latest Ref Rng & Units 07/19/2017 07/05/2017 06/21/2017  Glucose 70 - 99 mg/dL 85 86 88  BUN 6 - 20 mg/dL _0 Creatinine 0.44 - 1.00 mg/dL 1.04(H) 0.96 0.88  Sodium 135 - 145 mmol/L 142 139 141  Potassium 3.5 - 5.1 mmol/L 3.6 3.2(L) 3.6  Chloride 98 - 111 mmol/L 107 104 106  CO2 22 - 32 mmol/L _1 Calcium 8.9 - 10.3 mg/dL 9.1 9.2 9.5  Total Protein 6.5 - 8.1 g/dL 7.1 6.8 7.1  Total Bilirubin 0.3 - 1.2 mg/dL 2.2(H) 2.4(H) 1.2  Alkaline Phos 38 - 126 U/L 59 57 67  AST 15 - 41 U/L _2 ALT 0 - 44 U/L _3 ANC 1.1K today   TUMOR MARKER  Results for JAIELLE, DLOUHY (MRN [id number]) as of 07/14/2017 17:14  Ref. Range 12/16/2016 10:34 01/29/2017 08:54 03/22/2017 10:06 05/03/2017 08:55 06/14/2017 08:06  CA 27.29 Latest Ref Range: 0.0 - 38.6 U/mL 42.3 (H) 41.5 (H) 36.5 29.3 35.6     PROCEDURES:  Echo 04/22/2017 LV EF: 55% -   60%  ------------------------------------------------------------------- Indications:      Breast cancer (C50.911).  Global strain performed on Epiq 7 firmware 2.0.2.  ------------------------------------------------------------------- History:   Risk factors:  Breast cancer. Breast reconstruction. Anemia.  ------------------------------------------------------------------- Study Conclusions  - Left ventricle: The cavity size was normal. Systolic function was   normal. The estimated ejection fraction was in the range of 55%   to 60%. Wall motion was normal; there were no regional wall   motion abnormalities. Doppler parameters are consistent with   abnormal left ventricular relaxation (grade 1 diastolic   dysfunction). Doppler parameters are consistent with   indeterminate ventricular filling pressure. - Aortic valve: Transvalvular velocity was within the normal range.   There was no stenosis. There was no regurgitation. - Mitral valve: Transvalvular velocity was within the normal range.   There was no evidence for stenosis. There was trivial   regurgitation. - Right ventricle: The cavity size was normal. Wall thickness was   normal. Systolic function was normal. - Atrial septum: No defect or patent foramen ovale was identified. - Tricuspid valve: There was mild regurgitation. - Pulmonary arteries: Systolic pressure was within the normal   range. PA peak pressure: 24 mm Hg (S). - Global longitudinal strain -16.7% (mildly abnormal). Slight   change from 12/2016.   RADIOGRAPHIC  STUDIES:  PET Scan 04/30/17 IMPRESSION: 1. Primarily increase in hypermetabolism of osseous metastasis. An isolated right acetabular lesion is decreased in hypermetabolism since the prior exam. 2. No evidence of hypermetabolic soft tissue metastasis. 3. Similar non FDG avid right-sided thyroid nodule, favoring a benign etiology. Recommend attention on follow-up.  PET SCAN: 01/27/17 IMPRESSION: 1. Mixed response to osseous metastasis. Some hypermetabolic lesions are new and increasingly hypermetabolic, while another  index lesion is decreasingly hypermetabolic. 2. No evidence of hypermetabolic soft tissue metastasis.  CT A/P IMPRESSION: 10/28/16 No acute process demonstrated in the abdomen or pelvis. No evidence of bowel obstruction or inflammation.  CT CAP and Bone Scan: 10/05/16  Bones: left intramedullary rod and left femoral head neck screw in place. In the proximal diaphysis of the left femur there is cortical thickening and irregularity. No lytic or blastic bone lesions. No fracture or vertebral endplate destruction.  No definite evidence of recurrent disease in the chest, abdomen, or pelvis.  PET 05/20/2016 IMPRESSION: 1. There multiple metastatic foci in the bony pelvis, including some new abnormal foci compare to the prior PET-CT, compatible with mildly progressive bony metastatic disease. Also, a left T11 vertebral hypermetabolic metastatic lesion is observed, new compared to the prior exam. 2. No active extraosseous malignancy is currently identified. 3. Right anterior fourth rib healing fracture, appears benign.  NM Bone Scan 05/01/2016 IMPRESSION: 1. Small focal uptake involving the anterior rib ends of the left second and right fourth ribs, new since the prior bone scan. The location of this uptake is more suggestive of a traumatic or inflammatory etiology as opposed to metastatic disease. No other evidence of metastatic disease. 2. There are other areas of stable uptake which are most likely degenerative/reactive, including right shoulder and cervical spine uptake and uptake along the right femur adjacent to the ORIF Hardware.  CT CAP 05/01/2016 IMPRESSION: 1. Stable exam.  No new or progressive disease identified. 2. No evidence for residual or recurrent adenopathy within the chest. 3. Stable bone metastasis.  I have personally reviewed the radiological images as listed and agreed with the findings in the report. No results found.   ASSESSMENT & PLAN:  Mrs. Heuerman is a 52 y.o.  postmenopause female with:  1. 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 -Restaging PET 12-25-15 markedly improved of bone metastasis after initial systemic chemo  -she was switched to letrozole and maintenance Herceptin and the perjeta on 01-16-16.  -Did not tolerate SQ xgeva and it was changed to zometa  -Echocardiogram 01-27-16 showed normal EF, will continue monitoring every 3 months when she is on herceptin -I reviewed her CT scan and bone scan from 05/01/2016, which showed stable disease overall, a few new uptake of anterior ribs on bone scan are indeterminate. -She has developed worsening left hip pain, and more fatigued lately. I have clinical concern of disease progression. -I previously  discussed her restaging PET scan from 05/20/2016. 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 She declined other chemo.  -Labs reviewed, her bilirubin is slightly elevated but stable, adequate to continue treatment. 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  -She moved to [facility] for 3 months, back to [place] now  -We received records from [facility] in [place], [place]. She  underwent CT CAP w contrast on 10/02/16 and bone scan on 09/21/16. CT shows no evidence of recurrent disease in the chest, abdomen, or pelvis; Bone scan indicates no lytic or blastic bone lesions.  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  No other  visceral organ metastasis. She is clinically doing well.  -Kadcyla was switched to Exemestane, oral HER2 antibody treatment lapatinib and herceptin.  -She had severe side effects of diarrhea, depression and low appetite with lapatinib on full dose of 1500 mg after 1 week and she stopped. I recommended her to restart with low dose, and she can only tolerate low dose 500 mg of lapatinib, which she has been on -Her PET Scan from 04/30/17 revealed a primarily increase in hypermetabolism of osseous metastasis. There are no new lesions.  I reviewed the images and compared to the previous images in person with patient and her husband. I suspect her progression is due to her not being able to fully tolerate lapatinib -I recommend that we change her treatment. I discussed options with clinical trial for the new Her2 antibody conjugate Trastuzumab deruxtecan, the phase 3 clinical trial has been open, and the closest site is in [place] and [employer] . She is not interested in going to [place] at this time. --She started Verzenio 142m on 05/04/17 she has not tolerated well given her diarrhea, headaches, taste change and low appetite from taste change.  -I switched her to Ibrance 1071mand she started on 05/31/17. She has tolerated well with mild diarrhea and some skin ithcing.  -Due to her neutropenia, Ibrance dose reduced to 75 mg daily, 3 weeks on, one-week off, she is tolerating well -She just completed to cycle Ibrance yesterday, lab reviewed, ANC 1.1 today, she will be off 4 weeks, and restart next week -Continue Exemestane, tolerating well.  -Continue Herceptin every 3 weeks,, will obtain a repeated echo before next infusion -plan to repeat PET in 08/2017   2. History of MRSA skin infection scalp, boil at low back scalp and upper lip -I prescribed doxycycline on 04/24/16 for 7 days  - Resolved now.  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  -Husband also deaf.  4.  premature surgical menopause -post BSO [id number] pain and ovarian cysts, likely was also beneficial from standpoint of the breast cancer. -hysterectomy.   5. Intermittent anemia  -mild, possible related to prior chemo -She is asymptomatic, we'll continue close monitoring -Hg decreased due to Ibrance, at 10.5 today (06/14/17).   6. Hypokalemia  -She has had intermittent hypokalemia -She is now on potassium 20 mEq daily -Lab reviewed, potassium 3.6 today, new current potassium 20 Meq daily  7. Goal of care discussion  -We previously discussed the incurable nature of her cancer, and the overall poor prognosis, especially if she does not have good response to chemotherapy or progress on chemo.  -The patient understands the goal of care is palliative. -she is full code for now   8. Bilateral Leg Pain -she initially presented with worsening left leg and hip pain secondary to bone mets, she had surgery before, this was much improved after her prior chemo - The patient previously took  Hydrocodone for this pain, which helped. - I refilled Hydrocodone previously on 5/3. She will take 1 tablet every 6 hours as needed for pain. -Pain has recurred. Recent PET scan showed minimum uptake, unlikely related to bone mets progression  - I previously encouraged her to call Dr. [name] about the pain in her left leg. She has followed-up  With him.  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   -She notes she has occasional leg cramps, tolerated. She is taking oral calcium.    9. Insomnia -I give her Ambien 14m, but it did not work well.  -continue ativan 1 mg to take at night as needed  -mirtazapine did not work, she also tried trazodone from PCP which gave her a rash. She stopped taking it.   10.  Hyperbilirubinemia  -She has developed mild  hyperbilirubinemia recently, TBIL 1.2-2.7, okay to continue treatment  -overall stable     Plan: -I refilled ibrance today, she will start next cycle Ibrance 75 mg daily in one week, 3 weeks on, one-week off -I encouraged her to continue potassium chloride 20 mEq daily or 40 mEq a day if she has diarrhea -F/u at 1:30pm on 7/29 -Lab, flush, f/u and Herceptin (in bed) in 6 weeks  -will order PET on next visit  Pt had many questions. All questions were answered. The patient knows to call the clinic with any problems, questions or concerns.  I spent 20 minutes counseling the patient face to face. The total time spent in the appointment was 25 minutes and more than 50% was on counseling.  IDierdre SearlesDweik am acting as scribe for Dr. YTruitt Merle  I have reviewed the above documentation for accuracy and completeness, and I agree with the above.   YTruitt Merle MD 07/19/2017

## 2017-07-16 ENCOUNTER — Other Ambulatory Visit: Payer: Self-pay | Admitting: Hematology

## 2017-07-16 DIAGNOSIS — C50919 Malignant neoplasm of unspecified site of unspecified female breast: Secondary | ICD-10-CM

## 2017-07-16 MED FILL — IBRANCE 75 MG CAPSULE: 75 | 28 days supply | Qty: 21 | Fill #0

## 2017-07-19 ENCOUNTER — Inpatient Hospital Stay (HOSPITAL_BASED_OUTPATIENT_CLINIC_OR_DEPARTMENT_OTHER): Payer: Medicaid Other | Admitting: Hematology

## 2017-07-19 ENCOUNTER — Telehealth: Payer: Self-pay | Admitting: Hematology

## 2017-07-19 ENCOUNTER — Inpatient Hospital Stay: Payer: Medicaid Other

## 2017-07-19 ENCOUNTER — Inpatient Hospital Stay: Payer: Medicaid Other | Attending: Hematology

## 2017-07-19 DIAGNOSIS — Z8614 Personal history of Methicillin resistant Staphylococcus aureus infection: Secondary | ICD-10-CM | POA: Insufficient documentation

## 2017-07-19 DIAGNOSIS — Z79811 Long term (current) use of aromatase inhibitors: Secondary | ICD-10-CM

## 2017-07-19 DIAGNOSIS — M25552 Pain in left hip: Secondary | ICD-10-CM

## 2017-07-19 DIAGNOSIS — Z9221 Personal history of antineoplastic chemotherapy: Secondary | ICD-10-CM | POA: Diagnosis not present

## 2017-07-19 DIAGNOSIS — Z79899 Other long term (current) drug therapy: Secondary | ICD-10-CM | POA: Diagnosis not present

## 2017-07-19 DIAGNOSIS — C50911 Malignant neoplasm of unspecified site of right female breast: Secondary | ICD-10-CM

## 2017-07-19 DIAGNOSIS — G4700 Insomnia, unspecified: Secondary | ICD-10-CM

## 2017-07-19 DIAGNOSIS — Z95828 Presence of other vascular implants and grafts: Secondary | ICD-10-CM

## 2017-07-19 DIAGNOSIS — Z5112 Encounter for antineoplastic immunotherapy: Secondary | ICD-10-CM | POA: Insufficient documentation

## 2017-07-19 DIAGNOSIS — C50919 Malignant neoplasm of unspecified site of unspecified female breast: Secondary | ICD-10-CM

## 2017-07-19 DIAGNOSIS — C7951 Secondary malignant neoplasm of bone: Secondary | ICD-10-CM

## 2017-07-19 DIAGNOSIS — R197 Diarrhea, unspecified: Secondary | ICD-10-CM | POA: Diagnosis not present

## 2017-07-19 DIAGNOSIS — Z923 Personal history of irradiation: Secondary | ICD-10-CM | POA: Diagnosis not present

## 2017-07-19 DIAGNOSIS — Z170 Estrogen receptor positive status [ER+]: Secondary | ICD-10-CM | POA: Diagnosis not present

## 2017-07-19 DIAGNOSIS — C799 Secondary malignant neoplasm of unspecified site: Secondary | ICD-10-CM

## 2017-07-19 LAB — CBC WITH DIFFERENTIAL/PLATELET
BASOS ABS: 0 10*3/uL (ref 0.0–0.1)
Basophils Relative: 1 %
Eosinophils Absolute: 0 10*3/uL (ref 0.0–0.5)
Eosinophils Relative: 1 %
HCT: 29.7 % — ABNORMAL LOW (ref 34.8–46.6)
HEMOGLOBIN: 10.5 g/dL — AB (ref 11.6–15.9)
Lymphocytes Relative: 51 %
Lymphs Abs: 1.3 10*3/uL (ref 0.9–3.3)
MCH: 36 pg — AB (ref 25.1–34.0)
MCHC: 35.3 g/dL (ref 31.5–36.0)
MCV: 101.9 fL — AB (ref 79.5–101.0)
Monocytes Absolute: 0.2 10*3/uL (ref 0.1–0.9)
Monocytes Relative: 7 %
NEUTROS PCT: 40 %
Neutro Abs: 1.1 10*3/uL — ABNORMAL LOW (ref 1.5–6.5)
PLATELETS: 210 10*3/uL (ref 145–400)
RBC: 2.92 MIL/uL — AB (ref 3.70–5.45)
RDW: 21.1 % — ABNORMAL HIGH (ref 11.2–14.5)
WBC: 2.6 10*3/uL — AB (ref 3.9–10.3)

## 2017-07-19 LAB — COMPREHENSIVE METABOLIC PANEL
ALT: 18 U/L (ref 0–44)
ANION GAP: 7 (ref 5–15)
AST: 19 U/L (ref 15–41)
Albumin: 4 g/dL (ref 3.5–5.0)
Alkaline Phosphatase: 59 U/L (ref 38–126)
BILIRUBIN TOTAL: 2.2 mg/dL — AB (ref 0.3–1.2)
BUN: 12 mg/dL (ref 6–20)
CO2: 28 mmol/L (ref 22–32)
Calcium: 9.1 mg/dL (ref 8.9–10.3)
Chloride: 107 mmol/L (ref 98–111)
Creatinine, Ser: 1.04 mg/dL — ABNORMAL HIGH (ref 0.44–1.00)
Glucose, Bld: 85 mg/dL (ref 70–99)
Potassium: 3.6 mmol/L (ref 3.5–5.1)
Sodium: 142 mmol/L (ref 135–145)
TOTAL PROTEIN: 7.1 g/dL (ref 6.5–8.1)

## 2017-07-19 MED ORDER — ACETAMINOPHEN 325 MG PO TABS
ORAL_TABLET | ORAL | Status: AC
  Filled 2017-07-19: qty 2

## 2017-07-19 MED ORDER — SODIUM CHLORIDE 0.9 % IJ SOLN
10.0000 mL | INTRAMUSCULAR | Status: DC | PRN
  Administered 2017-07-19: 10 mL via INTRAVENOUS
  Filled 2017-07-19: qty 10

## 2017-07-19 MED ORDER — ACETAMINOPHEN 325 MG PO TABS
650.0000 mg | ORAL_TABLET | Freq: Once | ORAL | Status: AC
  Administered 2017-07-19: 650 mg via ORAL

## 2017-07-19 MED ORDER — SODIUM CHLORIDE 0.9 % IV SOLN
6.0000 mg/kg | Freq: Once | INTRAVENOUS | Status: AC
Start: 2017-07-19 — End: 2017-07-19
  Administered 2017-07-19: 357 mg via INTRAVENOUS
  Filled 2017-07-19: qty 17

## 2017-07-19 MED ORDER — LORATADINE 10 MG PO TABS
10.0000 mg | ORAL_TABLET | Freq: Once | ORAL | Status: AC
  Administered 2017-07-19: 10 mg via ORAL

## 2017-07-19 MED ORDER — HEPARIN SOD (PORK) LOCK FLUSH 100 UNIT/ML IV SOLN
500.0000 [IU] | Freq: Once | INTRAVENOUS | Status: AC | PRN
  Administered 2017-07-19: 500 [IU]
  Filled 2017-07-19: qty 5

## 2017-07-19 MED ORDER — PALBOCICLIB 75 MG PO CAPS
ORAL_CAPSULE | ORAL | 1 refills | Status: DC
Start: 2017-07-19 — End: 2017-09-01

## 2017-07-19 MED ORDER — SODIUM CHLORIDE 0.9% FLUSH
10.0000 mL | INTRAVENOUS | Status: DC | PRN
  Administered 2017-07-19: 10 mL
  Filled 2017-07-19: qty 10

## 2017-07-19 MED ORDER — SODIUM CHLORIDE 0.9 % IV SOLN
Freq: Once | INTRAVENOUS | Status: AC
  Administered 2017-07-19: 15:00:00 via INTRAVENOUS

## 2017-07-19 MED ORDER — LIDOCAINE-PRILOCAINE 2.5-2.5 % EX CREA: 1.0000 "application " | TOPICAL_CREAM | CUTANEOUS | 2 refills | Status: DC | PRN

## 2017-07-19 MED ORDER — LORATADINE 10 MG PO TABS
ORAL_TABLET | ORAL | Status: AC
  Filled 2017-07-19: qty 1

## 2017-07-19 NOTE — Progress Notes (Signed)
Confirmed Herceptin dose w/ MD.  Today pt will get 6 mg/kg and return in 3 weeks. MD aware ECHO due. Proceed w/ Herceptin today per MD. Kennith Center, Pharm.D., CPP 07/19/2017@3 :22 PM

## 2017-07-19 NOTE — Telephone Encounter (Signed)
Appointments scheduled AVS/Calendar pritned per 7/8 los

## 2017-07-19 NOTE — Progress Notes (Signed)
Completed on 07/19/2017 

## 2017-07-20 ENCOUNTER — Encounter: Payer: Self-pay | Admitting: Hematology

## 2017-07-20 LAB — CANCER ANTIGEN 27.29: CA 27.29: 49.2 U/mL — ABNORMAL HIGH (ref 0.0–38.6)

## 2017-07-28 ENCOUNTER — Other Ambulatory Visit (HOSPITAL_COMMUNITY): Payer: Medicaid Other

## 2017-08-04 ENCOUNTER — Ambulatory Visit (HOSPITAL_COMMUNITY)
Admission: RE | Admit: 2017-08-04 | Discharge: 2017-08-04 | Disposition: A | Discharge status: Home / Self Care | Payer: Medicaid Other | Source: Ambulatory Visit | Attending: Hematology | Admitting: Hematology

## 2017-08-04 DIAGNOSIS — R0789 Other chest pain: Secondary | ICD-10-CM | POA: Diagnosis not present

## 2017-08-04 DIAGNOSIS — C50911 Malignant neoplasm of unspecified site of right female breast: Secondary | ICD-10-CM | POA: Insufficient documentation

## 2017-08-04 DIAGNOSIS — C7951 Secondary malignant neoplasm of bone: Secondary | ICD-10-CM | POA: Diagnosis not present

## 2017-08-04 NOTE — Progress Notes (Signed)
  Echocardiogram 2D Echocardiogram has been performed.  [name] 08/04/2017, 11:44 AM

## 2017-08-09 ENCOUNTER — Ambulatory Visit: Payer: Medicaid Other

## 2017-08-09 ENCOUNTER — Inpatient Hospital Stay: Payer: Medicaid Other

## 2017-08-09 ENCOUNTER — Encounter: Payer: Self-pay | Admitting: Hematology

## 2017-08-09 ENCOUNTER — Inpatient Hospital Stay (HOSPITAL_BASED_OUTPATIENT_CLINIC_OR_DEPARTMENT_OTHER): Payer: Medicaid Other | Admitting: Hematology

## 2017-08-09 VITALS — BP 116/74 | HR 65 | Temp 97.7°F | Resp 18 | Wt 126.0 lb

## 2017-08-09 DIAGNOSIS — Z79899 Other long term (current) drug therapy: Secondary | ICD-10-CM | POA: Diagnosis not present

## 2017-08-09 DIAGNOSIS — G4700 Insomnia, unspecified: Secondary | ICD-10-CM

## 2017-08-09 DIAGNOSIS — M25552 Pain in left hip: Secondary | ICD-10-CM | POA: Diagnosis not present

## 2017-08-09 DIAGNOSIS — Z79811 Long term (current) use of aromatase inhibitors: Secondary | ICD-10-CM

## 2017-08-09 DIAGNOSIS — Z170 Estrogen receptor positive status [ER+]: Secondary | ICD-10-CM

## 2017-08-09 DIAGNOSIS — C50919 Malignant neoplasm of unspecified site of unspecified female breast: Secondary | ICD-10-CM

## 2017-08-09 DIAGNOSIS — C50911 Malignant neoplasm of unspecified site of right female breast: Secondary | ICD-10-CM

## 2017-08-09 DIAGNOSIS — R197 Diarrhea, unspecified: Secondary | ICD-10-CM | POA: Diagnosis not present

## 2017-08-09 DIAGNOSIS — C7951 Secondary malignant neoplasm of bone: Principal | ICD-10-CM

## 2017-08-09 DIAGNOSIS — Z923 Personal history of irradiation: Secondary | ICD-10-CM | POA: Diagnosis not present

## 2017-08-09 DIAGNOSIS — Z5112 Encounter for antineoplastic immunotherapy: Secondary | ICD-10-CM | POA: Diagnosis not present

## 2017-08-09 DIAGNOSIS — Z95828 Presence of other vascular implants and grafts: Secondary | ICD-10-CM

## 2017-08-09 DIAGNOSIS — Z9221 Personal history of antineoplastic chemotherapy: Secondary | ICD-10-CM | POA: Diagnosis not present

## 2017-08-09 DIAGNOSIS — C799 Secondary malignant neoplasm of unspecified site: Principal | ICD-10-CM

## 2017-08-09 DIAGNOSIS — Z8614 Personal history of Methicillin resistant Staphylococcus aureus infection: Secondary | ICD-10-CM | POA: Diagnosis not present

## 2017-08-09 LAB — CBC WITH DIFFERENTIAL/PLATELET
BASOS PCT: 1 %
Basophils Absolute: 0 10*3/uL (ref 0.0–0.1)
Eosinophils Absolute: 0 10*3/uL (ref 0.0–0.5)
Eosinophils Relative: 1 %
HEMATOCRIT: 33.8 % — AB (ref 34.8–46.6)
HEMOGLOBIN: 11.8 g/dL (ref 11.6–15.9)
Lymphocytes Relative: 42 %
Lymphs Abs: 1.3 10*3/uL (ref 0.9–3.3)
MCH: 35.9 pg — ABNORMAL HIGH (ref 25.1–34.0)
MCHC: 34.9 g/dL (ref 31.5–36.0)
MCV: 102.7 fL — ABNORMAL HIGH (ref 79.5–101.0)
MONOS PCT: 4 %
Monocytes Absolute: 0.1 10*3/uL (ref 0.1–0.9)
NEUTROS ABS: 1.7 10*3/uL (ref 1.5–6.5)
NEUTROS PCT: 52 %
Platelets: 200 10*3/uL (ref 145–400)
RBC: 3.29 MIL/uL — ABNORMAL LOW (ref 3.70–5.45)
RDW: 13.9 % (ref 11.2–14.5)
WBC: 3.2 10*3/uL — ABNORMAL LOW (ref 3.9–10.3)

## 2017-08-09 LAB — COMPREHENSIVE METABOLIC PANEL
ALK PHOS: 68 U/L (ref 38–126)
ALT: 11 U/L (ref 0–44)
ANION GAP: 7 (ref 5–15)
AST: 15 U/L (ref 15–41)
Albumin: 4.1 g/dL (ref 3.5–5.0)
BUN: 13 mg/dL (ref 6–20)
CALCIUM: 9.7 mg/dL (ref 8.9–10.3)
CHLORIDE: 106 mmol/L (ref 98–111)
CO2: 27 mmol/L (ref 22–32)
Creatinine, Ser: 1 mg/dL (ref 0.44–1.00)
GFR calc non Af Amer: >60 mL/min (ref >60)
Glucose, Bld: 77 mg/dL (ref 70–99)
Potassium: 4.1 mmol/L (ref 3.5–5.1)
SODIUM: 140 mmol/L (ref 135–145)
Total Bilirubin: 2.2 mg/dL — ABNORMAL HIGH (ref 0.3–1.2)
Total Protein: 7.5 g/dL (ref 6.5–8.1)

## 2017-08-09 MED ORDER — HEPARIN SOD (PORK) LOCK FLUSH 100 UNIT/ML IV SOLN
500.0000 [IU] | Freq: Once | INTRAVENOUS | Status: AC | PRN
  Administered 2017-08-09: 500 [IU]
  Filled 2017-08-09: qty 5

## 2017-08-09 MED ORDER — ACETAMINOPHEN 325 MG PO TABS
ORAL_TABLET | ORAL | Status: AC
  Filled 2017-08-09: qty 2

## 2017-08-09 MED ORDER — TRASTUZUMAB CHEMO 150 MG IV SOLR
6.0000 mg/kg | Freq: Once | INTRAVENOUS | Status: AC
  Administered 2017-08-09: 357 mg via INTRAVENOUS
  Filled 2017-08-09: qty 17

## 2017-08-09 MED ORDER — LORATADINE 10 MG PO TABS
10.0000 mg | ORAL_TABLET | Freq: Every day | ORAL | Status: DC
  Administered 2017-08-09: 10 mg via ORAL

## 2017-08-09 MED ORDER — SODIUM CHLORIDE 0.9% FLUSH
10.0000 mL | INTRAVENOUS | Status: DC | PRN
  Administered 2017-08-09: 10 mL via INTRAVENOUS
  Filled 2017-08-09: qty 10

## 2017-08-09 MED ORDER — SODIUM CHLORIDE 0.9% FLUSH
10.0000 mL | INTRAVENOUS | Status: DC | PRN
  Administered 2017-08-09: 10 mL
  Filled 2017-08-09: qty 10

## 2017-08-09 MED ORDER — ACETAMINOPHEN 325 MG PO TABS
650.0000 mg | ORAL_TABLET | Freq: Once | ORAL | Status: AC
  Administered 2017-08-09: 650 mg via ORAL

## 2017-08-09 MED ORDER — ACETAMINOPHEN 325 MG PO TABS
ORAL_TABLET | ORAL | Status: AC
  Filled 2017-08-09: qty 1

## 2017-08-09 MED ORDER — LORATADINE 10 MG PO TABS
ORAL_TABLET | ORAL | Status: AC
  Filled 2017-08-09: qty 1

## 2017-08-09 MED ORDER — SODIUM CHLORIDE 0.9 % IV SOLN
Freq: Once | INTRAVENOUS | Status: AC
  Administered 2017-08-09: 15:00:00 via INTRAVENOUS
  Filled 2017-08-09: qty 250

## 2017-08-09 NOTE — Patient Instructions (Signed)
[facility] Discharge Instructions for Patients Receiving Chemotherapy Today you received the following chemotherapy agents:  Herceptin To help prevent nausea and vomiting after your treatment, we encourage you to take your nausea medication as prescribed.   If you develop nausea and vomiting that is not controlled by your nausea medication, call the clinic.   BELOW ARE SYMPTOMS THAT SHOULD BE REPORTED IMMEDIATELY:  *FEVER GREATER THAN 100.5 F  *CHILLS WITH OR WITHOUT FEVER  NAUSEA AND VOMITING THAT IS NOT CONTROLLED WITH YOUR NAUSEA MEDICATION  *UNUSUAL SHORTNESS OF BREATH  *UNUSUAL BRUISING OR BLEEDING  TENDERNESS IN MOUTH AND THROAT WITH OR WITHOUT PRESENCE OF ULCERS  *URINARY PROBLEMS  *BOWEL PROBLEMS  UNUSUAL RASH Items with * indicate a potential emergency and should be followed up as soon as possible.  Feel free to call the clinic should you have any questions or concerns. The clinic phone number is [phone].  Please show the CHEMO ALERT CARD at check-in to the Emergency Department and triage nurse.   

## 2017-08-09 NOTE — Progress Notes (Signed)
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 She had mastectomy with the 7 axillary node evaluation, 4 cycles of adriamycin/ cytoxan followed by taxotere, then five years of tamoxifen thru June 2005 (no herceptin in 1999). She had bilateral oophorectomy in May 2005. She was briefly on aromatase inhibitor after tamoxifen, but discontinued this herself due to poor tolerance.      04/04/2015 Genetic Testing    Negative genetic testing on the breast/ovarian cancer pnael.  The Breast/Ovarian gene panel offered by [facility] includes sequencing and rearrangement analysis for the following 20 genes:  ATM, BARD1, BRCA1, BRCA2, BRIP1, CDH1, CHEK2, EPCAM, FANCC, MLH1, MSH2, MSH6, NBN, PALB2, PMS2, PTEN, RAD51C, RAD51D, TP53, and XRCC2.      06/26/2015 Pathology Results    Initial Path Report Bone, curettage, Left femur met, intramedullary subtroch tissue - METASTATIC ADENOCARCINOMA.  Estrogen Receptor: 95%, POSITIVE, STRONG STAINING INTENSITY Progesterone Receptor: 2%, POSITIVE, STRONG  HER2 (+), IHC 3+      06/26/2015 Surgery    Biopsy of metastatic tissue and stabilization with Affixus trochanteric nail, proximal and distal interlock of left femur by Dr. [name]       06/28/2015 Imaging    CT Chest w/ Contrast 1. Extensive right internal mammary chain lymphadenopathy consistent with metastatic breast cancer. 2. Lytic metastases involving the posterior elements  at T1 with probable nondisplaced pathologic fracture of the spinous process. 3. No other evidence of thoracic metastatic disease. 4. Trace bilateral pleural effusions with associated bibasilar atelectasis. 5. Indeterminate right thyroid nodule, not previously imaged. This could be evaluated with thyroid ultrasound as clinically warranted.      07/01/2015 Imaging    Bone Scan 1. Increased activity noted throughout the left femur. Although metastatic disease cannot be excluded, these changes are most likely secondary to prior surgery. 2. No other focal abnormalities identified to suggest metastatic disease.      07/12/2015 - 07/26/2015 Radiation Therapy    Left femur, 30 Gy in 10 fractions by Dr. Sondra Come       07/14/2015 Initial Diagnosis    [name], right ([place])      07/19/2015 Imaging    Initial PET Scan 1. Severe multifocal osseous metastatic disease, much greater than anticipated based on prior imaging. 2. Multifocal nodal metastases involving the right internal mammary, prevascular and subpectoral lymph nodes. No axillary pulmonary involvement identified. 3. No distant extra osseous metastases.      07/30/2015 - 11/29/2015 Chemotherapy    Docetaxel, Herceptin and Perjeta every 3 weeks X6 cycles, pt tolerated moderately well, restaging scan showed excellent response       09/18/2015 Imaging    CT Head w/wo Contrast 1. No acute intracranial abnormality or significant interval change. 2. Stable minimal periventricular white matter hypoattenuation on the right. This may reflect a remote ischemic injury. 3. No evidence for metastatic disease to the brain. 4. No focal soft tissue lesion to explain the patient's tenderness.  10/30/2015 Imaging    CT Abdomen Pelvis w/ Contrast 1. Few mildly prominent fluid-filled loops of small bowel scattered throughout the abdomen, with intraluminal fluid density within the distal colon. Given the provided history,  findings are suggestive of acute enteritis/diarrheal illness. 2. No other acute intra-abdominal or pelvic process identified. 3. Widespread osseous metastatic disease, better evaluated on most recent PET-CT from 07/19/2015. No pathologic fracture or other complication. 4. 11 mm hypodensity within the left kidney. This lesion measures intermediate density, and is indeterminate. While this lesion is similar in size relative to recent studies, this is increased in size relative to prior study from 2012. Further evaluation with dedicated renal mass protocol CT and/or MRI is recommended for complete characterization.      12/25/2015 Imaging    Restaging PET Scan 1. Markedly improved skeletal activity, with only several faint foci of residual accentuated metabolic activity, but resolution of the vast majority of the previously extensive osseous metastatic disease. 2. Resolution of the prior right internal mammary and right prevascular lymph nodes. 3. Residual subcutaneous edema and edema along fascia planes in the left upper thigh. This remains somewhat more than I would expect for placement of an IM nail 6 weeks ago. If there is leg swelling further down, consider Doppler venous ultrasound to rule out left lower extremity DVT. I do not perceive an obvious difference in density between the pelvic and common femoral veins on the noncontrast CT data. 4. Chronic right maxillary and right sphenoid sinusitis.      12/2015 -  Chemotherapy    Maintenance Herceptin and pejeta every 3 weeks      01/16/2016 -  Anti-estrogen oral therapy    Letrozole 2.5 mg daily      03/01/2016 Miscellaneous    Patient presented to ED following a fall; she reports she landed on her back and is now experiencing back pain. The patient was evaluated and discharge home same day with pain control.      05/01/2016 Imaging    CT CAP IMPRESSION: 1. Stable exam.  No new or progressive disease identified. 2. No evidence for  residual or recurrent adenopathy within the chest. 3. Stable bone metastasis.      05/01/2016 Imaging    BONE SCAN IMPRESSION: 1. Small focal uptake involving the anterior rib ends of the left second and right fourth ribs, new since the prior bone scan. The location of this uptake is more suggestive of a traumatic or inflammatory etiology as opposed to metastatic disease. No other evidence of metastatic disease. 2. There are other areas of stable uptake which are most likely degenerative/reactive, including right shoulder and cervical spine uptake and uptake along the right femur adjacent to the ORIF hardware.      05/20/2016 Imaging    NM PET Skull to Thigh IMPRESSION: 1. There multiple metastatic foci in the bony pelvis, including some new abnormal foci compare to the prior PET-CT, compatible with mildly progressive bony metastatic disease. Also, a left T11 vertebral hypermetabolic metastatic lesion is observed, new compared to the prior exam. 2. No active extraosseous malignancy is currently identified. 3. Right anterior fourth rib healing fracture, appears be      06/04/2016 -  Chemotherapy    Second line chemotherapy Kadcyla every 3 weeks started on 06/04/16       10/05/2016 Imaging    CT CAP and Bone Scan:  Bones: left intramedullary rod and left femoral head neck screw in place. In the proximal diaphysis of the left femur  there is cortical thickening and irregularity. No lytic or blastic bone lesions. No fracture or vertebral endplate destruction.   No definite evidence of recurrent disease in the chest, abdomen, or pelvis.      10/28/2016 Imaging    CT A/P IMPRESSION: No acute process demonstrated in the abdomen or pelvis. No evidence of bowel obstruction or inflammation.      11/04/2016 Imaging    DG foot complete left: IMPRESSION: No fracture or dislocation.  No soft tissue abnormality      11/23/2016 Imaging    CT RIGHT FEMUR IMPRESSION: Negative CT scan  of the right thigh.  No visible metastatic disease.  CT LEFT FEMUR IMPRESSION: 1. Postsurgical changes related to prior cephalomedullary rod fixation of the left femur with linear lucency along the anterolateral proximal femoral cortex at level of the lesser trochanter, suspicious for nondisplaced fracture. 2. Slightly permeative appearance of the proximal left femoral cortex at the site of known prior osseous metastases is largely unchanged. 3. Unchanged patchy lucency and sclerosis along the anterior acetabulum, which appears to correspond with an area of faint uptake on the prior PET-CT, suspicious for osseous metastasis. These results will be called to the ordering clinician or representative by the Radiologist Assistant, and communication documented in the PACS or zVision Dashboard.        01/27/2017 Imaging    IMPRESSION: 1. Mixed response to osseous metastasis. Some hypermetabolic lesions are new and increasingly hypermetabolic, while another index lesion is decreasingly hypermetabolic. 2. No evidence of hypermetabolic soft tissue metastasis.       04/30/2017 PET scan    IMPRESSION: 1. Primarily increase in hypermetabolism of osseous metastasis. An isolated right acetabular lesion is decreased in hypermetabolism since the prior exam. 2. No evidence of hypermetabolic soft tissue metastasis. 3. Similar non FDG avid right-sided thyroid nodule, favoring a benign etiology. Recommend attention on follow-up.       HISTORY OF PRESENTING ILLNESS (From Dr. Mariana Kaufman note on 02/06/2016):  [name] 52 y.o. 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 She has had an excellent response to 6 cycles of taxotere herceptin perjeta from 07-30-15 thru 11-29-15. She is continuing herceptin and perjeta every 3 weeks. She had extreme local pain with first xgeva injection on 01-16-16 and  prefers resuming previous zometa. She began letrozole ~ 01-16-16, has no complaints about this at all.  Last imaging was PET 12-25-15.  NOTE CTs, plain Xrays and bone scan did NOT show any of the extensively metastatic bone involvement 06-2015; MRI and PET did image disease clearly.  Patient continues to do very well overall. MRSA skin lesions on scalp and face improved quickly with doxycycline starting 01-16-16. She continues to use Dial soap and we have again strongly encouraged good hygiene with other family members at home. She has had the 3 lymphedema PT sessions allowed by insurance, continues to do massage for LLE herself, still has not gotten the ordered compression apparatus for home and will follow up with PT on that. Swelling in LLE has improved with the interventions and does not seem as uncomfortable. She denies any pain. Appetite is excellent, bowels fine, no SOB, good energy, no problems with PAC, no noted changes in left breast. No bleeding. No fever or symptoms of infection. She slipped in tub last week, fell onto right arm fortunately without injury. She was upset by a visitor and shaky then, but no tremors otherwise.  She has two children and 4 grandchildren. She has had genetic testing and it was negative. She lives at home with her husband. She is very active. Her children live close to her.   CURRENT THERAPY:    Exemestane 70m daily and herceptin every 3 weeks, Ibrance 1080mstarted on 05/31/17. Held for 1 week on 06/14/17 due to pancytopenia. Restarted her at 757maily, 3 weeks on and one week off.   INTERIM HISTORY:   [name] 32o. female returns today for follow up and ongoing treatment. She presents to the infusion room today with her interpreter.  She wonders if she should be off for her Ibrance for 1 week before restarting next cycle. She was told from someone from pharmacy to stop. She denies skipping any days. Based on last visit she was to start current cycle on  07/26/17 and this is her 3rd week. Next week she will be off.  She notes pain in her upper left leg 7-8/10. She notes recent trouble breathing when active or resting at home. She is taking hydrocodone only at night. The pain medication makes her tired so she does not take any during the day. She has not tried tylenol yet.   She notes her daughter's boyfriend killed her dog in 02/2017 and she has been dwelling on that a lot with some stress. She does not know how to cope with that and has felt sad and down about this. She declined speaking with counselor or therapist.  [place] go [place].    MEDICAL HISTORY:  Past Medical History:  Diagnosis Date  . Abnormal Pap smear   . Anxiety   . [place]   breast-s/p mastectomy, chemo, rad  . CIN I (cervical intraepithelial neoplasia I) 2003   by colpo  . Congenital deafness   . Deaf   . Depression   . Radiation 07/12/15-07/26/15   left femur 30 Gy  . S/P bilateral oophorectomy     SURGICAL HISTORY: Past Surgical History:  Procedure Laterality Date  . ABDOMINAL HYSTERECTOMY    . INTRAMEDULLARY (IM) NAIL INTERTROCHANTERIC Left 06/26/2015   Procedure: LEFT BIOMET LONG AFFIXS NAIL;  Surgeon: MarMarybelle KillingsD;  [facility];  Laterality: Left;  . IR GENERIC HISTORICAL  08/06/2015   IR US [place]IDE VASC ACCESS LEFT 08/06/2015 GleAletta EdouardD [facility]INTER[facility]  . IR GENERIC HISTORICAL  08/06/2015   IR FLUORO GUIDE CV LINE LEFT 08/06/2015 GleAletta EdouardD [facility]  . IR GENERIC HISTORICAL  03/05/2016   IR CV LINE INJECTION 03/05/2016 AdaMarkus DaftD [facility]  . MASTECTOMY     right breast  . MASTECTOMY    . OVARIAN CYST REMOVAL    . RADIOLOGY WITH ANESTHESIA Left 06/25/2015   Procedure: MRI OF LEFT HIP WITH OR WITHOUT CONTRAST;  Surgeon: Medication Radiologist, MD;  [facility];  Laterality: Left;  DR. MCINTYRE/MRI  . TUBAL LIGATION        SOCIAL HISTORY: Social History   Socioeconomic History  . Marital status: Married    Spouse name: Not on file  . Number of children: 2  . Years of education: Not on file  . Highest education level: Not on file  Occupational History  . Not on file  Social Needs  . Financial resource strain: Not on file  . Food insecurity:  Worry: Not on file    Inability: Not on file  . Transportation needs:    Medical: Not on file    Non-medical: Not on file  Tobacco Use  . Smoking status: Never Smoker  . Smokeless tobacco: Never Used  Substance and Sexual Activity  . Alcohol use: No  . Drug use: No  . Sexual activity: Yes    Birth control/protection: Surgical  Lifestyle  . Physical activity:    Days per week: Not on file    Minutes per session: Not on file  . Stress: Not on file  Relationships  . Social connections:    Talks on phone: Not on file    Gets together: Not on file    Attends religious service: Not on file    Active member of club or organization: Not on file    Attends meetings of clubs or organizations: Not on file    Relationship status: Not on file  . Intimate partner violence:    Fear of current or ex partner: Not on file    Emotionally abused: Not on file    Physically abused: Not on file    Forced sexual activity: Not on file  Other Topics Concern  . Not on file  Social History Narrative  . Not on file    FAMILY HISTORY: Family History  Problem Relation Age of Onset  . Hypertension Mother   . Seizures Mother   . Alzheimer's disease Maternal Uncle   . Pancreatitis Maternal Grandmother   . Heart attack Maternal Grandfather     ALLERGIES:  is allergic to chlorhexidine; promethazine hcl; and diphenhydramine hcl.  MEDICATIONS:  Current Outpatient Medications  Medication Sig Dispense Refill  . Cholecalciferol 2000 units CAPS Take 1 capsule by mouth daily.    . divalproex (DEPAKOTE) 250 MG DR tablet Take 1 tablet (250 mg total) by mouth at  bedtime. 30 tablet 2  . exemestane (AROMASIN) 25 MG tablet Take 1 tablet (25 mg total) by mouth daily after breakfast. 30 tablet 3  . HYDROcodone-acetaminophen (NORCO) 10-325 MG tablet Take 1 tablet by mouth every 6 (six) hours as needed. 45 tablet 0  . lidocaine-prilocaine (EMLA) cream Apply 1 application topically as needed. Apply to Porta-Cath 1-2 hours prior to access as directed. 30 g 2  . palbociclib (IBRANCE) 75 MG capsule TAKE 1 CAPSULE (75 MG TOTAL) BY MOUTH DAILY WITH BREAKFAST. TAKE WHOLE WITH FOOD. TAKE FOR 21 DAYS ON, 7 DAYS OFF, REPEAT EVERY 28 DAYS. 21 capsule 1  . potassium chloride (K-DUR) 10 MEQ tablet Take 10 mEq by mouth daily. Take 2 tablets daily.    . prochlorperazine (COMPAZINE) 10 MG tablet Take 1 tablet (10 mg total) by mouth every 6 (six) hours as needed for nausea or vomiting. 30 tablet 2   No current facility-administered medications for this visit.    Facility-Administered Medications Ordered in Other Visits  Medication Dose Route Frequency Provider Last Rate Last Dose  . sodium chloride 0.9 % 1,000 mL with potassium chloride 10 mEq infusion   Intravenous Continuous Gordy Levan, MD   Stopped at 09/10/15 1653  . sodium chloride 0.9 % injection 10 mL  10 mL Intravenous PRN Livesay, Lennis P, MD        REVIEW OF SYSTEMS:  Constitutional: Denies fevers, chills or abnormal night sweats , low appetite from stress , weight stable Eyes: Denies blurriness of vision, double vision or watery eyes Ears, nose, mouth, throat, and face: Denies mucositis or sore  throat (+) deaf Respiratory: Denies cough, dyspnea or wheezes (+) Mild dyspnea  Cardiovascular: Denies palpitation, chest discomfort or lower extremity swelling Skin: No rashes or discolorations Gastrointestinal:  Denies nausea    Lymphatics: Denies new lymphadenopathy or easy bruising. MSK: (+) left Leg cramps and pain Behavioral/Psych: Normal (+) stress, depression All other systems were reviewed with the  patient and are negative.  PHYSICAL EXAMINATION: ECOG PERFORMANCE STATUS: 1  Vitals with BMI 08/09/2017  Height 5'7"  Weight 126 lbs  BMI 29.79  Systolic 892  Diastolic 74  Pulse 65  Respirations 18    GENERAL:alert, no distress and comfortable  EYES: normal, conjunctiva are pink and non-injected, sclera clear OROPHARYNX:no exudate, no erythema and lips, buccal mucosa, and tongue normal  NECK: supple, thyroid normal size, non-tender, without nodularity LYMPH:  no palpable lymphadenopathy in the cervical, axillary or inguinal LUNGS: clear to auscultation and percussion with normal breathing effort HEART: regular rate & rhythm and no murmurs and no lower extremity edema ABDOMEN:abdomen soft, non-tender and normal bowel sounds. No organomegaly. Musculoskeletal:no cyanosis of digits and no clubbing  PSYCH: alert & oriented x 3 with fluent speech NEURO: no focal motor/sensory deficits Breasts: Breast inspection showed Right breast is surgically absent status post reconstruction. Palpation of the breasts and axilla revealed no obvious mass that I could appreciate.   LABORATORY DATA:  I have reviewed the data as listed CBC Latest Ref Rng & Units 08/09/2017 07/19/2017 07/05/2017  WBC 3.9 - 10.3 K/uL 3.2(L) 2.6(L) 2.3(L)  Hemoglobin 11.6 - 15.9 g/dL 11.8 10.5(L) 10.9(L)  Hematocrit 34.8 - 46.6 % 33.8(L) 29.7(L) 30.9(L)  Platelets 145 - 400 K/uL 200 210 235   CMP Latest Ref Rng & Units 08/09/2017 07/19/2017 07/05/2017  Glucose 70 - 99 mg/dL 77 85 86  BUN 6 - 20 mg/dL _0 Creatinine 0.44 - 1.00 mg/dL 1.00 1.04(H) 0.96  Sodium 135 - 145 mmol/L 140 142 139  Potassium 3.5 - 5.1 mmol/L 4.1 3.6 3.2(L)  Chloride 98 - 111 mmol/L 106 107 104  CO2 22 - 32 mmol/L _1 Calcium 8.9 - 10.3 mg/dL 9.7 9.1 9.2  Total Protein 6.5 - 8.1 g/dL 7.5 7.1 6.8  Total Bilirubin 0.3 - 1.2 mg/dL 2.2(H) 2.2(H) 2.4(H)  Alkaline Phos 38 - 126 U/L 68 59 57  AST 15 - 41 U/L _2 ALT 0 - 44 U/L _3 ANC 1.7K today   TUMOR MARKER  Results for CARLETTE, PALMATIER (MRN [id number]) as of 07/14/2017 17:14  Ref. Range 12/16/2016 10:34 01/29/2017 08:54 03/22/2017 10:06 05/03/2017 08:55 06/14/2017 08:06  CA 27.29 Latest Ref Range: 0.0 - 38.6 U/mL 42.3 (H) 41.5 (H) 36.5 29.3 35.6     PROCEDURES:  Echo 04/22/2017 LV EF: 55% -   60%  ------------------------------------------------------------------- Indications:      Breast cancer (C50.911). Global strain performed on Epiq 7 firmware 2.0.2.  ------------------------------------------------------------------- History:   Risk factors:  Breast cancer. Breast reconstruction. Anemia.  ------------------------------------------------------------------- Study Conclusions  - Left ventricle: The cavity size was normal. Systolic function was   normal. The estimated ejection fraction was in the range of 55%   to 60%. Wall motion was normal; there were no regional wall   motion abnormalities. Doppler parameters are consistent with   abnormal left ventricular relaxation (grade 1 diastolic   dysfunction). Doppler parameters are consistent with   indeterminate ventricular filling pressure. - Aortic valve: Transvalvular velocity was within the normal range.  There was no stenosis. There was no regurgitation. - Mitral valve: Transvalvular velocity was within the normal range.   There was no evidence for stenosis. There was trivial   regurgitation. - Right ventricle: The cavity size was normal. Wall thickness was   normal. Systolic function was normal. - Atrial septum: No defect or patent foramen ovale was identified. - Tricuspid valve: There was mild regurgitation. - Pulmonary arteries: Systolic pressure was within the normal   range. PA peak pressure: 24 mm Hg (S). - Global longitudinal strain -16.7% (mildly abnormal). Slight   change from 12/2016.   RADIOGRAPHIC STUDIES:  PET Scan 04/30/17 IMPRESSION: 1. Primarily increase in  hypermetabolism of osseous metastasis. An isolated right acetabular lesion is decreased in hypermetabolism since the prior exam. 2. No evidence of hypermetabolic soft tissue metastasis. 3. Similar non FDG avid right-sided thyroid nodule, favoring a benign etiology. Recommend attention on follow-up.  PET SCAN: 01/27/17 IMPRESSION: 1. Mixed response to osseous metastasis. Some hypermetabolic lesions are new and increasingly hypermetabolic, while another index lesion is decreasingly hypermetabolic. 2. No evidence of hypermetabolic soft tissue metastasis.  CT A/P IMPRESSION: 10/28/16 No acute process demonstrated in the abdomen or pelvis. No evidence of bowel obstruction or inflammation.  CT CAP and Bone Scan: 10/05/16  Bones: left intramedullary rod and left femoral head neck screw in place. In the proximal diaphysis of the left femur there is cortical thickening and irregularity. No lytic or blastic bone lesions. No fracture or vertebral endplate destruction.  No definite evidence of recurrent disease in the chest, abdomen, or pelvis.  PET 05/20/2016 IMPRESSION: 1. There multiple metastatic foci in the bony pelvis, including some new abnormal foci compare to the prior PET-CT, compatible with mildly progressive bony metastatic disease. Also, a left T11 vertebral hypermetabolic metastatic lesion is observed, new compared to the prior exam. 2. No active extraosseous malignancy is currently identified. 3. Right anterior fourth rib healing fracture, appears benign.  NM Bone Scan 05/01/2016 IMPRESSION: 1. Small focal uptake involving the anterior rib ends of the left second and right fourth ribs, new since the prior bone scan. The location of this uptake is more suggestive of a traumatic or inflammatory etiology as opposed to metastatic disease. No other evidence of metastatic disease. 2. There are other areas of stable uptake which are most likely degenerative/reactive, including right shoulder and  cervical spine uptake and uptake along the right femur adjacent to the ORIF Hardware.  CT CAP 05/01/2016 IMPRESSION: 1. Stable exam.  No new or progressive disease identified. 2. No evidence for residual or recurrent adenopathy within the chest. 3. Stable bone metastasis.  I have personally reviewed the radiological images as listed and agreed with the findings in the report. No results found.   ASSESSMENT & PLAN:  Mrs. Rasberry is a 52 y.o. postmenopause female with:  1. 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 -Restaging PET 12-25-15 markedly improved of bone metastasis after initial systemic chemo  -she was switched to letrozole and maintenance Herceptin and the perjeta on 01-16-16.  -Did not tolerate SQ xgeva and it was changed to zometa  -Echocardiogram 01-27-16 showed normal EF, will continue monitoring every 3 months when she is on herceptin -I reviewed her CT scan and bone scan from 05/01/2016, which showed stable disease overall, a few new uptake of anterior ribs on bone scan are indeterminate. -She has developed  worsening left hip pain, and more fatigued lately. I have clinical concern of disease progression. -I previously  discussed her restaging PET scan from 05/20/2016. 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 She declined other chemo.  -Labs reviewed, her bilirubin is slightly elevated but stable, adequate to continue treatment. 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  -She moved to [facility] for 3 months, back to [place] now  -We received records from [facility] in [place], [place]. She underwent CT CAP w contrast on 10/02/16 and bone scan on 09/21/16. CT shows no evidence of recurrent disease in the  chest, abdomen, or pelvis; Bone scan indicates no lytic or blastic bone lesions.  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  No other visceral organ metastasis. She is clinically doing well.  -Kadcyla was switched to Exemestane, oral HER2 antibody treatment lapatinib and herceptin.  -She had severe side effects of diarrhea, depression and low appetite with lapatinib on full dose of 1500 mg after 1 week and she stopped. I recommended her to restart with low dose, and she can only tolerate low dose 500 mg of lapatinib, which she has been on -Her PET Scan from 04/30/17 revealed a primarily increase in hypermetabolism of osseous metastasis. There are no new lesions.  I reviewed the images and compared to the previous images in person with patient and her husband. I suspect her progression is due to her not being able to fully tolerate lapatinib -I previously discussed options with clinical trial for the new Her2 antibody conjugate Trastuzumab deruxtecan, the phase 3 clinical trial has been open, and the closest site is in [place] and [employer] . She is not interested in going to [place] at this time. -She started Verzenio '150mg'$  on 05/04/17 she has not tolerated well given her diarrhea, headaches, taste change and low appetite from taste change.  -I switched her to Ibrance '100mg'$  and she started on 05/31/17. She has tolerated well with mild diarrhea and some skin itching.  -Due to her neutropenia, Ibrance dose reduced to 75 mg daily, 3 weeks on, one-week off, she is tolerating well. I reviewed Ibrance use. She is currently on week 3 treatment. Next cycle starts 08/23/17.  -Continue Exemestane  daily -Continue Herceptin today and every 3 weeks -Will order repeat PET to be done in 2-3 weeks. She has had more bilateral hip pain, and some dyspnea lately, concerning for disease progression. -F/u in 3 weeks    2. History of MRSA skin infection scalp, boil at low back scalp and upper lip -I prescribed doxycycline on 04/24/16 for 7 days  - Resolved now.  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  -Husband also deaf.  4. premature surgical menopause -post BSO 2070fr pelvic pain and ovarian cysts, likely was also beneficial from standpoint of the breast cancer. -hysterectomy   5. Intermittent anemia  -mild, possible related to prior chemo -She is asymptomatic, we'll continue close monitoring -Hg normalized to 11.8 today (08/09/17)  6. Hypokalemia  -She has had intermittent hypokalemia -Continue on potassium 20  mEq daily -K normal at 4.1 today (08/09/17)  7. Goal of care discussion  -We previously discussed the incurable nature of her cancer, and the overall poor prognosis, especially if she does not have good response to chemotherapy or progress on chemo.  -The patient understands the goal of care is palliative. -she is full code for now   8. Bilateral Leg Pain -She initially presented with worsening left leg and hip pain secondary to bone mets, she had surgery before, this was much improved after her prior chemo - The patient previously took Hydrocodone for this pain, which helped. - I refilled Hydrocodone previously on 5/3. She will take 1 tablet every 6 hours as needed for pain. -Pain has recurred. Recent PET scan showed minimum uptake, unlikely related to bone mets progression  - I previously encouraged her to call Dr. [name] about the pain in her left leg. She has followed-up  With him.  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 She will continue calcium and Norco as needed. I suggest she try tylenol for pain during the day. She is willing to try.   9. Insomnia, Depression, Stress -I previously gave her Ambien 36m, but it did not work well.  -continue ativan 1 mg to take at night as needed  -mirtazapine did not work, she also tried trazodone from PCP which gave her a rash. She stopped taking it.  -After the death of her dog she has been stressed and sad from how it happened.  -I offered her the chance to speak with our counselor or a therapist, she declined at this time. She denied inclinations of self harm.  -I also offered her the choice of antidepressants. She will try coping without medication for now.  -I encouraged her to speak with family or friends about her feelings.   10.  Hyperbilirubinemia  -She has developed mild hyperbilirubinemia recently, TBIL 1.2-2.7, okay to continue treatment  -overall stable at 2.2 today (08/09/17)  11. Mild dyspnea  -Her recent ECHO shows mostly adequate heart function -If her breathing worsens I encouraged her to contact the clinic  -her lung exam today was unremarkable, oxygen level is normal, will continue monitoring     Plan: -Will provide chemo alert card for her today  -Labs reviewed and adequate to proceed with Herceptin today  -Continue Ibrance for this week then off for one week. Start next cycle on 08/23/17.  -Continue Exemestane daily.  -PET in 2-3 weeks  -Lab, flush, f/u and Herceptin in 3 weeks    Pt had many questions. All questions were answered. The patient knows to call the clinic with any problems, questions or concerns.  I spent 20 minutes counseling the patient face to face. The total time spent in the appointment was 25 minutes and more than 50% was on counseling.  IOneal Deputy am acting as scribe for YTruitt Merle MD.   I have reviewed the above documentation for accuracy and completeness, and I agree with the above.     YTruitt Merle  MD 08/09/2017

## 2017-08-10 ENCOUNTER — Telehealth: Payer: Self-pay | Admitting: Hematology

## 2017-08-10 NOTE — Telephone Encounter (Signed)
PET scan in 2-3 weeks per 7/29 los

## 2017-08-18 NOTE — Progress Notes (Signed)
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 She had mastectomy with the 7 axillary node evaluation, 4 cycles of adriamycin/ cytoxan followed by taxotere, then five years of tamoxifen thru June 2005 (no herceptin in 1999). She had bilateral oophorectomy in May 2005. She was briefly on aromatase inhibitor after tamoxifen, but discontinued this herself due to poor tolerance.    04/04/2015 Genetic Testing    Negative genetic testing on the breast/ovarian cancer pnael.  The Breast/Ovarian gene panel offered by [facility] includes sequencing and rearrangement analysis for the following 20 genes:  ATM, BARD1, BRCA1, BRCA2, BRIP1, CDH1, CHEK2, EPCAM, FANCC, MLH1, MSH2, MSH6, NBN, PALB2, PMS2, PTEN, RAD51C, RAD51D, TP53, and XRCC2.    06/26/2015 Pathology Results    Initial Path Report Bone, curettage, Left femur met, intramedullary subtroch tissue - METASTATIC ADENOCARCINOMA.  Estrogen Receptor: 95%, POSITIVE, STRONG STAINING INTENSITY Progesterone Receptor: 2%, POSITIVE, STRONG  HER2 (+), IHC 3+    06/26/2015 Surgery    Biopsy of metastatic tissue and stabilization with Affixus trochanteric nail, proximal and distal interlock of left femur by Dr. [name]     06/28/2015 Imaging    CT Chest w/ Contrast 1. Extensive right internal mammary chain lymphadenopathy consistent with metastatic breast cancer. 2. Lytic metastases involving the posterior elements at T1 with  probable nondisplaced pathologic fracture of the spinous process. 3. No other evidence of thoracic metastatic disease. 4. Trace bilateral pleural effusions with associated bibasilar atelectasis. 5. Indeterminate right thyroid nodule, not previously imaged. This could be evaluated with thyroid ultrasound as clinically warranted.    07/01/2015 Imaging    Bone Scan 1. Increased activity noted throughout the left femur. Although metastatic disease cannot be excluded, these changes are most likely secondary to prior surgery. 2. No other focal abnormalities identified to suggest metastatic disease.    07/12/2015 - 07/26/2015 Radiation Therapy    Left femur, 30 Gy in 10 fractions by Dr. Sondra Come     07/14/2015 Initial Diagnosis    [name], right ([place])    07/19/2015 Imaging    Initial PET Scan 1. Severe multifocal osseous metastatic disease, much greater than anticipated based on prior imaging. 2. Multifocal nodal metastases involving the right internal mammary, prevascular and subpectoral lymph nodes. No axillary pulmonary involvement identified. 3. No distant extra osseous metastases.    07/30/2015 - 11/29/2015 Chemotherapy    Docetaxel, Herceptin and Perjeta every 3 weeks X6 cycles, pt tolerated moderately well, restaging scan showed excellent response     09/18/2015 Imaging    CT Head w/wo Contrast 1. No acute intracranial abnormality or significant interval change. 2. Stable minimal periventricular white matter hypoattenuation on the right. This may reflect a remote ischemic injury. 3. No evidence for metastatic disease to the brain. 4. No focal soft tissue lesion to explain the patient's tenderness.    10/30/2015 Imaging    CT Abdomen Pelvis w/ Contrast 1. Few mildly prominent fluid-filled loops of  small bowel scattered throughout the abdomen, with intraluminal fluid density within the distal colon. Given the provided history, findings are suggestive of acute  enteritis/diarrheal illness. 2. No other acute intra-abdominal or pelvic process identified. 3. Widespread osseous metastatic disease, better evaluated on most recent PET-CT from 07/19/2015. No pathologic fracture or other complication. 4. 11 mm hypodensity within the left kidney. This lesion measures intermediate density, and is indeterminate. While this lesion is similar in size relative to recent studies, this is increased in size relative to prior study from 2012. Further evaluation with dedicated renal mass protocol CT and/or MRI is recommended for complete characterization.    12/25/2015 Imaging    Restaging PET Scan 1. Markedly improved skeletal activity, with only several faint foci of residual accentuated metabolic activity, but resolution of the vast majority of the previously extensive osseous metastatic disease. 2. Resolution of the prior right internal mammary and right prevascular lymph nodes. 3. Residual subcutaneous edema and edema along fascia planes in the left upper thigh. This remains somewhat more than I would expect for placement of an IM nail 6 weeks ago. If there is leg swelling further down, consider Doppler venous ultrasound to rule out left lower extremity DVT. I do not perceive an obvious difference in density between the pelvic and common femoral veins on the noncontrast CT data. 4. Chronic right maxillary and right sphenoid sinusitis.    12/2015 - 05/14/2016 Chemotherapy    Maintenance Herceptin and pejeta every 3 weeks stopped on 05/14/16 due to disease progression    01/16/2016 -  Anti-estrogen oral therapy    Letrozole 2.5 mg daily. She was switched to exemestane due to disease progression on 02/08/17.    03/01/2016 Miscellaneous    Patient presented to ED following a fall; she reports she landed on her back and is now experiencing back pain. The patient was evaluated and discharge home same day with pain control.    05/01/2016 Imaging    CT CAP IMPRESSION: 1. Stable  exam.  No new or progressive disease identified. 2. No evidence for residual or recurrent adenopathy within the chest. 3. Stable bone metastasis.    05/01/2016 Imaging    BONE SCAN IMPRESSION: 1. Small focal uptake involving the anterior rib ends of the left second and right fourth ribs, new since the prior bone scan. The location of this uptake is more suggestive of a traumatic or inflammatory etiology as opposed to metastatic disease. No other evidence of metastatic disease. 2. There are other areas of stable uptake which are most likely degenerative/reactive, including right shoulder and cervical spine uptake and uptake along the right femur adjacent to the ORIF hardware.    05/20/2016 Imaging    NM PET Skull to Thigh IMPRESSION: 1. There multiple metastatic foci in the bony pelvis, including some new abnormal foci compare to the prior PET-CT, compatible with mildly progressive bony metastatic disease. Also, a left T11 vertebral hypermetabolic metastatic lesion is observed, new compared to the prior exam. 2. No active extraosseous malignancy is currently identified. 3. Right anterior fourth rib healing fracture, appears be    06/04/2016 - 01/08/2017 Chemotherapy    Second line chemotherapy Kadcyla every 3 weeks started on 06/04/16 and stopped on 01/08/17 due to mixed response.      10/05/2016 Imaging    CT CAP and Bone Scan:  Bones: left intramedullary rod and left femoral head neck screw in place. In the proximal diaphysis of the left femur there is cortical thickening and irregularity. No lytic  or blastic bone lesions. No fracture or vertebral endplate destruction.   No definite evidence of recurrent disease in the chest, abdomen, or pelvis.    10/28/2016 Imaging    CT A/P IMPRESSION: No acute process demonstrated in the abdomen or pelvis. No evidence of bowel obstruction or inflammation.    11/04/2016 Imaging    DG foot complete left: IMPRESSION: No fracture or  dislocation.  No soft tissue abnormality    11/23/2016 Imaging    CT RIGHT FEMUR IMPRESSION: Negative CT scan of the right thigh.  No visible metastatic disease.  CT LEFT FEMUR IMPRESSION: 1. Postsurgical changes related to prior cephalomedullary rod fixation of the left femur with linear lucency along the anterolateral proximal femoral cortex at level of the lesser trochanter, suspicious for nondisplaced fracture. 2. Slightly permeative appearance of the proximal left femoral cortex at the site of known prior osseous metastases is largely unchanged. 3. Unchanged patchy lucency and sclerosis along the anterior acetabulum, which appears to correspond with an area of faint uptake on the prior PET-CT, suspicious for osseous metastasis. These results will be called to the ordering clinician or representative by the Radiologist Assistant, and communication documented in the PACS or zVision Dashboard.      01/27/2017 Imaging    IMPRESSION: 1. Mixed response to osseous metastasis. Some hypermetabolic lesions are new and increasingly hypermetabolic, while another index lesion is decreasingly hypermetabolic. 2. No evidence of hypermetabolic soft tissue metastasis.     02/08/2017 -  Antibody Plan    -Exemestane 25 mg daily since 02/08/17 -Herceptin every 3 week since 02/08/17 -Oral Lanpantinib 1518m and decreased to 507mdue to diarrhea and depression. Due to disease progression she swithced to Verzenio 15031mn 05/04/17. She did not toelrate well so she was switched to Ibrance 100m58m 05/31/17. Due to her neutropenia, Ibrance dose reduced to 75 mg daily, 3 weeks on, one-week off.     04/30/2017 PET scan    IMPRESSION: 1. Primarily increase in hypermetabolism of osseous metastasis. An isolated right acetabular lesion is decreased in hypermetabolism since the prior exam. 2. No evidence of hypermetabolic soft tissue metastasis. 3. Similar non FDG avid right-sided thyroid nodule, favoring  a benign etiology. Recommend attention on follow-up.     HISTORY OF PRESENTING ILLNESS (From Dr. LiveMariana Kaufmane on 02/06/2016):  Debra Boniery50. 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 She has had an excellent response to 6 cycles of taxotere herceptin perjeta from 07-30-15 thru 11-29-15. She is continuing herceptin and perjeta every 3 weeks. She had extreme local pain with first xgeva injection on 01-16-16 and prefers resuming previous zometa. She began letrozole ~ 01-16-16, has no complaints about this at all.  Last imaging was PET 12-25-15.  NOTE CTs, plain Xrays and bone scan did NOT show any of the extensively metastatic bone involvement 06-2015; MRI and PET did image disease clearly.  Patient continues to do very well overall. MRSA skin lesions on scalp and face improved quickly with doxycycline starting 01-16-16. She continues to use Dial soap and we have again strongly encouraged good hygiene with other family members at home. She has had the 3 lymphedema PT sessions allowed by insurance, continues to do massage for LLE herself, still has not gotten the ordered compression apparatus for home and will follow up with PT on that. Swelling in LLE has improved with the interventions and does not seem as uncomfortable.  She denies any pain. Appetite is excellent, bowels fine, no SOB, good energy, no problems with PAC, no noted changes in left breast. No bleeding. No fever or symptoms of infection. She slipped in tub last week, fell onto right arm fortunately without injury. She was upset by a visitor and shaky then, but no tremors otherwise.   She has two children and 4 grandchildren. She has had genetic testing and it was negative. She lives at home with her husband. She is very active. Her children live close to her.   CURRENT THERAPY:    -Exemestane 63m daily starting 02/08/17 -Herceptin  every 3 weeks starting 02/08/17 -Ibrance 1024mstarted on 05/31/17. Held for 1 week on 06/14/17 due to pancytopenia. Restarted her at 7539maily, 3 weeks on and one week off.   INTERIM HISTORY:   [name] 89o. female returns today for follow up and ongoing treatment. She presents to the clinic today with her interpreter and her husband.  She notes she is doing well overall but has been tired and pain in her right shoulder (5-8/10). She notes she is still able to due what she needs to do. She notes brushing or washing her hair may cause pain. Her scalp has been sensitive. She has been taking hydrocodone for pain as needed once a day. She still has pills left.  [facility] and was denied due to her insurance because her Medicaid insurance card was inactive. She was not able to pick up her Ibrance.    MEDICAL HISTORY:  Past Medical History:  Diagnosis Date  . Abnormal Pap smear   . Anxiety   . [place] [facility]   breast-s/p mastectomy, chemo, rad  . CIN I (cervical intraepithelial neoplasia I) 2003   by colpo  . Congenital deafness   . Deaf   . Depression   . Radiation 07/12/15-07/26/15   left femur 30 Gy  . S/P bilateral oophorectomy     SURGICAL HISTORY: Past Surgical History:  Procedure Laterality Date  . ABDOMINAL HYSTERECTOMY    . INTRAMEDULLARY (IM) NAIL INTERTROCHANTERIC Left 06/26/2015   Procedure: LEFT BIOMET LONG AFFIXS NAIL;  Surgeon: MarMarybelle KillingsD;  [facility]  Laterality: Left;  . IR GENERIC HISTORICAL  08/06/2015   IR US [place]IDE VASC ACCESS LEFT 08/06/2015 GleAletta EdouardD [facility]  . IR GENERIC HISTORICAL  08/06/2015   IR FLUORO GUIDE CV LINE LEFT 08/06/2015 GleAletta EdouardD [facility]  . IR GENERIC HISTORICAL  03/05/2016   IR CV LINE INJECTION 03/05/2016 AdaMarkus DaftD [facility]  . MASTECTOMY     right breast  . MASTECTOMY    . OVARIAN CYST REMOVAL    . RADIOLOGY WITH  ANESTHESIA Left 06/25/2015   Procedure: MRI OF LEFT HIP WITH OR WITHOUT CONTRAST;  Surgeon: Medication Radiologist, MD;  [facility];  Laterality: Left;  DR. MCINTYRE/MRI  . TUBAL LIGATION      SOCIAL HISTORY: Social History   Socioeconomic History  . Marital status: Married    Spouse name: Not on file  . Number of children: 2  . Years of education: Not on file  . Highest education level: Not on file  Occupational History  . Not on file  Social Needs  . Financial resource strain: Not on file  . Food insecurity:    Worry: Not on file  Inability: Not on file  . Transportation needs:    Medical: Not on file    Non-medical: Not on file  Tobacco Use  . Smoking status: Never Smoker  . Smokeless tobacco: Never Used  Substance and Sexual Activity  . Alcohol use: No  . Drug use: No  . Sexual activity: Yes    Birth control/protection: Surgical  Lifestyle  . Physical activity:    Days per week: Not on file    Minutes per session: Not on file  . Stress: Not on file  Relationships  . Social connections:    Talks on phone: Not on file    Gets together: Not on file    Attends religious service: Not on file    Active member of club or organization: Not on file    Attends meetings of clubs or organizations: Not on file    Relationship status: Not on file  . Intimate partner violence:    Fear of current or ex partner: Not on file    Emotionally abused: Not on file    Physically abused: Not on file    Forced sexual activity: Not on file  Other Topics Concern  . Not on file  Social History Narrative  . Not on file    FAMILY HISTORY: Family History  Problem Relation Age of Onset  . Hypertension Mother   . Seizures Mother   . Alzheimer's disease Maternal Uncle   . Pancreatitis Maternal Grandmother   . Heart attack Maternal Grandfather     ALLERGIES:  is allergic to chlorhexidine; promethazine hcl; and diphenhydramine hcl.  MEDICATIONS:  Current  Outpatient Medications  Medication Sig Dispense Refill  . Cholecalciferol 2000 units CAPS Take 1 capsule by mouth daily.    . divalproex (DEPAKOTE) 250 MG DR tablet TAKE 1 TABLET (250 MG TOTAL) BY MOUTH AT BEDTIME. 30 tablet 2  . exemestane (AROMASIN) 25 MG tablet Take 1 tablet (25 mg total) by mouth daily after breakfast. 30 tablet 3  . HYDROcodone-acetaminophen (NORCO) 10-325 MG tablet Take 1 tablet by mouth every 6 (six) hours as needed. 45 tablet 0  . lidocaine-prilocaine (EMLA) cream Apply 1 application topically as needed. Apply to Porta-Cath 1-2 hours prior to access as directed. 30 g 2  . palbociclib (IBRANCE) 75 MG capsule TAKE 1 CAPSULE (75 MG TOTAL) BY MOUTH DAILY WITH BREAKFAST. TAKE WHOLE WITH FOOD. TAKE FOR 21 DAYS ON, 7 DAYS OFF, REPEAT EVERY 28 DAYS. 21 capsule 1  . potassium chloride (K-DUR) 10 MEQ tablet Take 10 mEq by mouth daily. Take 2 tablets daily.    . prochlorperazine (COMPAZINE) 10 MG tablet Take 1 tablet (10 mg total) by mouth every 6 (six) hours as needed for nausea or vomiting. (Patient not taking: Reported on 08/23/2017) 30 tablet 2   No current facility-administered medications for this visit.    Facility-Administered Medications Ordered in Other Visits  Medication Dose Route Frequency Provider Last Rate Last Dose  . sodium chloride 0.9 % 1,000 mL with potassium chloride 10 mEq infusion   Intravenous Continuous Gordy Levan, MD   Stopped at 09/10/15 1653  . sodium chloride 0.9 % injection 10 mL  10 mL Intravenous PRN Livesay, Lennis P, MD      . sodium chloride 0.9 % injection 10 mL  10 mL Intravenous PRN Truitt Merle, MD   10 mL at 08/23/17 1023    REVIEW OF SYSTEMS:  Constitutional: Denies fevers, chills or abnormal night sweats , low appetite from stress ,  weight stable Eyes: Denies blurriness of vision, double vision or watery eyes Ears, nose, mouth, throat, and face: Denies mucositis or sore throat (+) deaf Respiratory: Denies cough, dyspnea or wheezes (+)  Mild dyspnea  Cardiovascular: Denies palpitation, chest discomfort or lower extremity swelling Skin: No rashes or discolorations (+) scalp tenderness Gastrointestinal:  Denies nausea    Lymphatics: Denies new lymphadenopathy or easy bruising. MSK: (+) left Leg cramps and pain and right shoulder pain (5-8/10) Behavioral/Psych: Normal (+) stress, depression All other systems were reviewed with the patient and are negative.  PHYSICAL EXAMINATION: ECOG PERFORMANCE STATUS: 1 Vitals:   08/23/17 0848  BP: 123/75  Pulse: 62  Resp: 18  Temp: 97.6 F (36.4 C)  TempSrc: Oral  SpO2: 100%  Weight: 128 lb 11.2 oz (58.4 kg)  Height: _0  (1.702 m)    GENERAL:alert, no distress and comfortable  EYES: normal, conjunctiva are pink and non-injected, sclera clear OROPHARYNX:no exudate, no erythema and lips, buccal mucosa, and tongue normal  NECK: supple, thyroid normal size, non-tender, without nodularity LYMPH:  no palpable lymphadenopathy in the cervical, axillary or inguinal LUNGS: clear to auscultation and percussion with normal breathing effort HEART: regular rate & rhythm and no murmurs and no lower extremity edema ABDOMEN:abdomen soft, non-tender and normal bowel sounds. No organomegaly. Musculoskeletal:no cyanosis of digits and no clubbing  PSYCH: alert & oriented x 3 with fluent speech NEURO: no focal motor/sensory deficits Breasts: Breast inspection showed Right breast is surgically absent status post reconstruction. Palpation of the breasts and axilla revealed no obvious mass that I could appreciate.   LABORATORY DATA:  I have reviewed the data as listed CBC Latest Ref Rng & Units 08/23/2017 08/09/2017 07/19/2017  WBC 3.9 - 10.3 K/uL 2.8(L) 3.2(L) 2.6(L)  Hemoglobin 11.6 - 15.9 g/dL 11.1(L) 11.8 10.5(L)  Hematocrit 34.8 - 46.6 % 31.8(L) 33.8(L) 29.7(L)  Platelets 145 - 400 K/uL 128(L) 200 210   CMP Latest Ref Rng & Units 08/23/2017 08/09/2017 07/19/2017  Glucose 70 - 99 mg/dL 91 77 85    BUN 6 - 20 mg/dL _1 Creatinine 0.44 - 1.00 mg/dL 0.94 1.00 1.04(H)  Sodium 135 - 145 mmol/L 140 140 142  Potassium 3.5 - 5.1 mmol/L 3.4(L) 4.1 3.6  Chloride 98 - 111 mmol/L 105 106 107  CO2 22 - 32 mmol/L _2 Calcium 8.9 - 10.3 mg/dL 9.1 9.7 9.1  Total Protein 6.5 - 8.1 g/dL 6.9 7.5 7.1  Total Bilirubin 0.3 - 1.2 mg/dL 1.9(H) 2.2(H) 2.2(H)  Alkaline Phos 38 - 126 U/L 62 68 59  AST 15 - 41 U/L _3 ALT 0 - 44 U/L _4 ANC 0.9K today   TUMOR MARKER  Results for ATOYA, ANDREW (MRN [id number]) as of 08/18/2017 16:42  Ref. Range 01/29/2017 08:54 03/22/2017 10:06 05/03/2017 08:55 06/14/2017 08:06 07/19/2017 13:36  CA 27.29 Latest Ref Range: 0.0 - 38.6 U/mL 41.5 (H) 36.5 29.3 35.6 49.2 (H)  PENDING for 08/23/17   PROCEDURES:  RADIOGRAPHIC STUDIES:  PET Scan 04/30/17 IMPRESSION: 1. Primarily increase in hypermetabolism of osseous metastasis. An isolated right acetabular lesion is decreased in hypermetabolism since the prior exam. 2. No evidence of hypermetabolic soft tissue metastasis. 3. Similar non FDG avid right-sided thyroid nodule, favoring a benign etiology. Recommend attention on follow-up.  PET SCAN: 01/27/17 IMPRESSION: 1. Mixed response to osseous metastasis. Some hypermetabolic lesions are new and increasingly hypermetabolic, while another index lesion is decreasingly hypermetabolic. 2. No  evidence of hypermetabolic soft tissue metastasis.  CT A/P IMPRESSION: 10/28/16 No acute process demonstrated in the abdomen or pelvis. No evidence of bowel obstruction or inflammation.  CT CAP and Bone Scan: 10/05/16  Bones: left intramedullary rod and left femoral head neck screw in place. In the proximal diaphysis of the left femur there is cortical thickening and irregularity. No lytic or blastic bone lesions. No fracture or vertebral endplate destruction.  No definite evidence of recurrent disease in the chest, abdomen, or pelvis.  PET 05/20/2016 IMPRESSION: 1.  There multiple metastatic foci in the bony pelvis, including some new abnormal foci compare to the prior PET-CT, compatible with mildly progressive bony metastatic disease. Also, a left T11 vertebral hypermetabolic metastatic lesion is observed, new compared to the prior exam. 2. No active extraosseous malignancy is currently identified. 3. Right anterior fourth rib healing fracture, appears benign.  NM Bone Scan 05/01/2016 IMPRESSION: 1. Small focal uptake involving the anterior rib ends of the left second and right fourth ribs, new since the prior bone scan. The location of this uptake is more suggestive of a traumatic or inflammatory etiology as opposed to metastatic disease. No other evidence of metastatic disease. 2. There are other areas of stable uptake which are most likely degenerative/reactive, including right shoulder and cervical spine uptake and uptake along the right femur adjacent to the ORIF Hardware.  CT CAP 05/01/2016 IMPRESSION: 1. Stable exam.  No new or progressive disease identified. 2. No evidence for residual or recurrent adenopathy within the chest. 3. Stable bone metastasis.  I have personally reviewed the radiological images as listed and agreed with the findings in the report. No results found.   ASSESSMENT & PLAN:  [name] is a 52 y.o. postmenopause female with:  1. 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 -Restaging PET 12-25-15 markedly improved of bone metastasis after initial systemic chemo  -she was switched to letrozole and maintenance Herceptin and the perjeta on 01/16/16 which stopped on 05/14/16.  -Did not tolerate SQ xgeva and it was changed to zometa in 06/2015 -Continue monitoring ECHO every 3 months when she is on herceptin -I previously reviewed her CT scan and bone scan from 05/01/2016, which showed  stable disease overall, a few new uptake of anterior ribs on bone scan are indeterminate. -I previously discussed her restaging PET scan from 05/20/2016. Unfortunately it showed mild disease progression of her bone metastasis  -I have changed her treatment to second line Kadcyla and letrozole on 06/04/16, she is tolerating very well. She declined other chemo.  -She previously moved to [facility] for [facility] months, back to [place] now  -We previously received records from [facility] in [place], [place]. She underwent CT CAP w contrast on 10/02/16 and bone scan on 09/21/16. CT shows no evidence of recurrent disease in the chest, abdomen, or pelvis; Bone scan indicates no lytic or blastic bone lesions.  -She has been on Zometa every 4-6 weeks since June 2017. We previously changed to every 3 months in 12/2016. 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  No other visceral organ metastasis. She is clinically doing well.  -Kadcyla was switched to Exemestane, oral HER2  antibody treatment lapatinib and herceptin starting 02/08/17. -She had severe side effects of diarrhea, depression and low appetite with lapatinib on full dose of 1500 mg after 1 week and she stopped. I recommended her to restart with low dose, and she can only tolerate low dose 500 mg of lapatinib, which she has been on -Her PET Scan from 04/30/17 revealed a primarily increase in hypermetabolism of osseous metastasis. There are no new lesions.  I reviewed the images and compared to the previous images in person with patient and her husband. I suspect her progression is due to her not being able to fully tolerate lapatinib -I previously discussed options with clinical trial for the new Her2 antibody conjugate Trastuzumab deruxtecan,  the phase 3 clinical trial has been open, and the closest site is in [place] and [employer]. She is not interested in going to [place] at this time. -She started Verzenio 137m on 05/04/17 she has not tolerated well given her diarrhea, headaches, taste change and low appetite from taste change.  -I switched her to Ibrance 1063mand she started on 05/31/17. She has tolerated well with mild diarrhea and some skin itching.  -Due to her neutropenia, Ibrance dose reduced to 75 mg daily, 3 weeks on, one-week off, she is tolerating well with mild fatigue.  -Labs reviewed, mild pancytopenia due to Ibrance, potassium at 3.4, total bili at 1.9. I encouraged her to increase potassium in her diet and to increase her oral K to BID for the next 3 days.  -Her medicaid insurance card is now inactive and she was not able to pick up refill of Ibrance. Pharmacist JeDenyse Amassill give her sample pills today so she can start her next cycle tomorrow while pt works on her insurance.  -Continue Exemestane daily -Continue Herceptin today and every 3 weeks -PET is scheduled for 08/30/17. She has had more bilateral hip pain and now right shoulder pain concerning for disease progression. Depending on her results I may change her treatment if she has disease progression.  -she is actually not due for Herceptin until next week. I will see her back next week after the PET scan with Herceptin treatment.   2. History of MRSA skin infection scalp, boil at low back scalp and upper lip -I prescribed doxycycline on 04/24/16 for 7 days  -Resolved now.  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  -Husband also deaf.  4. premature surgical menopause -post BSO [id number] pain and ovarian cysts, likely was also beneficial from standpoint of the breast cancer. -hysterectomy   5. Intermittent anemia  -mild, possible related to prior chemo -She is asymptomatic, we'll continue close monitoring -Hg at 11.1  today (08/23/17)   6. Hypokalemia  -She has had intermittent hypokalemia -Continue on potassium 20 mEq daily.  -K decreased to 3.4 today (08/23/17). She will take K BID for 3 days and then return to once daily.   7. Goal of care discussion  -We previously discussed the incurable nature of her cancer, and the overall poor prognosis, especially if she does not have good response to chemotherapy or progress on chemo.  -The patient understands the goal of care is palliative. -she is full code for now   8. Bilateral Leg Pain -She initially presented with worsening left leg and hip pain secondary to bone mets, she had surgery before, this was much improved after her prior chemo -The patient previously took NorLa Harper this pain, 1 tablet every 6 hours as needed  for pain. Which helped. -I previously encouraged her to follow up with Dr. [name] about the pain in her left leg. 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 She will continue calcium and Norco as needed. I suggest she try tylenol for pain during the day. She is willing to try.   9. Insomnia, Depression, Stress  -I previously gave her Ambien 37m, but it did not work well.  -continue ativan 1 mg to take at night as needed  -mirtazapine did not work, she also tried trazodone from PCP which gave her a rash. She stopped taking it.  -After the death of her dog she has been stressed and sad from how it happened.  -I previously offered her the chance to speak with our counselor or a therapist, she declined at this time. She denied inclinations of self harm.  -I also previously offered her the choice of antidepressants. She will try coping without medication for now. I previously encouraged her to speak with family or friends about her feelings.   10.  Hyperbilirubinemia  -She has developed mild hyperbilirubinemia recently, TBIL  1.2-2.7, okay to continue treatment  -improved to 1.9 today (08/23/17)   11. Mild dyspnea  -Her 08/04/17 ECHO shows mostly adequate heart function -If her breathing worsens I encouraged her to contact the clinic  -Her lung exam today was unremarkable, oxygen level is normal, will continue monitoring     Plan: -Labs reviewed, she is not due for Herceptin until next week. -Continue Exemestane daily -Proceed with next cycle Ibrance tomorrow, free medication provided by Pharmacist JDenyse Amasstoday.  -PET on 08/30/17 and I will see her on 8/20  -Lab, flush, f/u and Herceptin in 1 and 4 weeks    Pt had many questions. All questions were answered. The patient knows to call the clinic with any problems, questions or concerns.  I spent 20 minutes counseling the patient face to face. The total time spent in the appointment was 25 minutes and more than 50% was on counseling.  IOneal Deputy am acting as scribe for YTruitt Merle MD.   I have reviewed the above documentation for accuracy and completeness, and I agree with the above.      YTruitt Merle MD 08/23/2017

## 2017-08-21 ENCOUNTER — Other Ambulatory Visit: Payer: Self-pay | Admitting: Hematology

## 2017-08-21 DIAGNOSIS — R42 Dizziness and giddiness: Secondary | ICD-10-CM

## 2017-08-23 ENCOUNTER — Encounter: Payer: Self-pay | Admitting: Hematology

## 2017-08-23 ENCOUNTER — Ambulatory Visit: Payer: Medicaid Other

## 2017-08-23 ENCOUNTER — Encounter: Payer: Self-pay | Admitting: Pharmacist

## 2017-08-23 ENCOUNTER — Telehealth: Payer: Self-pay | Admitting: Hematology

## 2017-08-23 ENCOUNTER — Inpatient Hospital Stay: Payer: Medicaid Other

## 2017-08-23 ENCOUNTER — Inpatient Hospital Stay: Payer: Medicaid Other | Attending: Hematology

## 2017-08-23 ENCOUNTER — Inpatient Hospital Stay (HOSPITAL_BASED_OUTPATIENT_CLINIC_OR_DEPARTMENT_OTHER): Payer: Medicaid Other | Admitting: Hematology

## 2017-08-23 VITALS — BP 123/75 | HR 62 | Temp 97.6°F | Resp 18 | Ht 67.0 in | Wt 128.7 lb

## 2017-08-23 DIAGNOSIS — E876 Hypokalemia: Secondary | ICD-10-CM | POA: Insufficient documentation

## 2017-08-23 DIAGNOSIS — D61818 Other pancytopenia: Secondary | ICD-10-CM | POA: Insufficient documentation

## 2017-08-23 DIAGNOSIS — Z170 Estrogen receptor positive status [ER+]: Secondary | ICD-10-CM | POA: Diagnosis not present

## 2017-08-23 DIAGNOSIS — Z79899 Other long term (current) drug therapy: Secondary | ICD-10-CM

## 2017-08-23 DIAGNOSIS — C50911 Malignant neoplasm of unspecified site of right female breast: Secondary | ICD-10-CM | POA: Insufficient documentation

## 2017-08-23 DIAGNOSIS — Z5112 Encounter for antineoplastic immunotherapy: Secondary | ICD-10-CM | POA: Diagnosis not present

## 2017-08-23 DIAGNOSIS — C7951 Secondary malignant neoplasm of bone: Secondary | ICD-10-CM

## 2017-08-23 DIAGNOSIS — D649 Anemia, unspecified: Secondary | ICD-10-CM | POA: Diagnosis not present

## 2017-08-23 DIAGNOSIS — Z8614 Personal history of Methicillin resistant Staphylococcus aureus infection: Secondary | ICD-10-CM | POA: Diagnosis not present

## 2017-08-23 DIAGNOSIS — C50919 Malignant neoplasm of unspecified site of unspecified female breast: Secondary | ICD-10-CM

## 2017-08-23 DIAGNOSIS — H905 Unspecified sensorineural hearing loss: Secondary | ICD-10-CM | POA: Insufficient documentation

## 2017-08-23 DIAGNOSIS — Z95828 Presence of other vascular implants and grafts: Secondary | ICD-10-CM

## 2017-08-23 DIAGNOSIS — Z79811 Long term (current) use of aromatase inhibitors: Secondary | ICD-10-CM

## 2017-08-23 DIAGNOSIS — G4700 Insomnia, unspecified: Secondary | ICD-10-CM

## 2017-08-23 DIAGNOSIS — C799 Secondary malignant neoplasm of unspecified site: Secondary | ICD-10-CM

## 2017-08-23 LAB — COMPREHENSIVE METABOLIC PANEL
ALT: 11 U/L (ref 0–44)
ANION GAP: 11 (ref 5–15)
AST: 15 U/L (ref 15–41)
Albumin: 3.8 g/dL (ref 3.5–5.0)
Alkaline Phosphatase: 62 U/L (ref 38–126)
BILIRUBIN TOTAL: 1.9 mg/dL — AB (ref 0.3–1.2)
BUN: 11 mg/dL (ref 6–20)
CALCIUM: 9.1 mg/dL (ref 8.9–10.3)
CO2: 24 mmol/L (ref 22–32)
Chloride: 105 mmol/L (ref 98–111)
Creatinine, Ser: 0.94 mg/dL (ref 0.44–1.00)
Glucose, Bld: 91 mg/dL (ref 70–99)
Potassium: 3.4 mmol/L — ABNORMAL LOW (ref 3.5–5.1)
Sodium: 140 mmol/L (ref 135–145)
TOTAL PROTEIN: 6.9 g/dL (ref 6.5–8.1)

## 2017-08-23 LAB — CBC WITH DIFFERENTIAL/PLATELET
BASOS ABS: 0 10*3/uL (ref 0.0–0.1)
BASOS PCT: 0 %
EOS ABS: 0 10*3/uL (ref 0.0–0.5)
Eosinophils Relative: 1 %
HCT: 31.8 % — ABNORMAL LOW (ref 34.8–46.6)
HEMOGLOBIN: 11.1 g/dL — AB (ref 11.6–15.9)
Lymphocytes Relative: 58 %
Lymphs Abs: 1.7 10*3/uL (ref 0.9–3.3)
MCH: 35.9 pg — ABNORMAL HIGH (ref 25.1–34.0)
MCHC: 34.9 g/dL (ref 31.5–36.0)
MCV: 102.9 fL — ABNORMAL HIGH (ref 79.5–101.0)
MONOS PCT: 11 %
Monocytes Absolute: 0.3 10*3/uL (ref 0.1–0.9)
NEUTROS ABS: 0.9 10*3/uL — AB (ref 1.5–6.5)
Neutrophils Relative %: 30 %
Platelets: 128 10*3/uL — ABNORMAL LOW (ref 145–400)
RBC: 3.09 MIL/uL — ABNORMAL LOW (ref 3.70–5.45)
RDW: 13.7 % (ref 11.2–14.5)
WBC: 2.8 10*3/uL — AB (ref 3.9–10.3)

## 2017-08-23 MED ORDER — HEPARIN SOD (PORK) LOCK FLUSH 100 UNIT/ML IV SOLN
500.0000 [IU] | Freq: Once | INTRAVENOUS | Status: AC | PRN
  Administered 2017-08-23: 500 [IU] via INTRAVENOUS
  Filled 2017-08-23: qty 5

## 2017-08-23 MED ORDER — SODIUM CHLORIDE 0.9 % IJ SOLN
10.0000 mL | INTRAMUSCULAR | Status: DC | PRN
  Administered 2017-08-23: 10 mL via INTRAVENOUS
  Filled 2017-08-23: qty 10

## 2017-08-23 NOTE — Telephone Encounter (Signed)
Scheduled appt  8/12 los - gave patient AVS and calender per los.

## 2017-08-23 NOTE — Progress Notes (Signed)
Per Dr Burr Medico pt is not supposed to get treated today.  Port deaccessed and pt sent to scheduling to reschedule for next week.

## 2017-08-23 NOTE — Patient Instructions (Signed)
No treatment today.

## 2017-08-23 NOTE — Progress Notes (Signed)
Oral Chemotherapy Pharmacist Encounter  Received notification that patient's Medicaid insurance coverage is inactive at this moment.  Patient is planned to restart Ibrance 75 mg capsules, 1 capsule by mouth once daily with food, taken for 3 weeks on, one week off, repeated every 28 days on tomorrow (08/24/2017)  She is unable to pick up her next fill due to issues with insurance. She has already placed a call to her Medicaid caseworker and is waiting for a call back  Noted ANC equals 0.9 today Discussed with MD, okay to proceed with next Ibrance cycle Restaging scans will be repeated prior to next cycle of Ibrance where treatment continuation will be discussed again at that time  We will dispense medication to patient in order to prevent treatment break  Dispensed samples to patient:  Medication: Ibrance 75 mg capsules Instructions: Take 1 capsule by mouth once daily with food, taken for 3 weeks on, one week off, repeated every 28 days Quantity dispensed: 21 Days supply: 28 Manufacturer: [employer] Lot: [id number] Exp: 06/2018  We will alert pharmacy about refill issues and continue to follow.  Johny Drilling, PharmD, BCPS, BCOP 08/23/2017 11:30 AM [facility] [phone]

## 2017-08-24 LAB — CANCER ANTIGEN 27.29: CA 27.29: 62 U/mL — ABNORMAL HIGH (ref 0.0–38.6)

## 2017-08-26 NOTE — Progress Notes (Signed)
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 She had mastectomy with the 7 axillary node evaluation, 4 cycles of adriamycin/ cytoxan followed by taxotere, then five years of tamoxifen thru June 2005 (no herceptin in 1999). She had bilateral oophorectomy in May 2005. She was briefly on aromatase inhibitor after tamoxifen, but discontinued this herself due to poor tolerance.    04/04/2015 Genetic Testing    Negative genetic testing on the breast/ovarian cancer pnael.  The Breast/Ovarian gene panel offered by [facility] includes sequencing and rearrangement analysis for the following 20 genes:  ATM, BARD1, BRCA1, BRCA2, BRIP1, CDH1, CHEK2, EPCAM, FANCC, MLH1, MSH2, MSH6, NBN, PALB2, PMS2, PTEN, RAD51C, RAD51D, TP53, and XRCC2.    06/26/2015 Pathology Results    Initial Path Report Bone, curettage, Left femur met, intramedullary subtroch tissue - METASTATIC ADENOCARCINOMA.  Estrogen Receptor: 95%, POSITIVE, STRONG STAINING INTENSITY Progesterone Receptor: 2%, POSITIVE, STRONG  HER2 (+), IHC 3+    06/26/2015 Surgery    Biopsy of metastatic tissue and stabilization with Affixus trochanteric nail, proximal and distal interlock of left femur by Dr. [name]     06/28/2015 Imaging    CT Chest w/ Contrast 1. Extensive right internal mammary chain lymphadenopathy consistent with metastatic breast cancer. 2. Lytic metastases involving the posterior elements at T1 with  probable nondisplaced pathologic fracture of the spinous process. 3. No other evidence of thoracic metastatic disease. 4. Trace bilateral pleural effusions with associated bibasilar atelectasis. 5. Indeterminate right thyroid nodule, not previously imaged. This could be evaluated with thyroid ultrasound as clinically warranted.    07/01/2015 Imaging    Bone Scan 1. Increased activity noted throughout the left femur. Although metastatic disease cannot be excluded, these changes are most likely secondary to prior surgery. 2. No other focal abnormalities identified to suggest metastatic disease.    07/12/2015 - 07/26/2015 Radiation Therapy    Left femur, 30 Gy in 10 fractions by Dr. Sondra Come     07/14/2015 Initial Diagnosis    [name], right ([place])    07/19/2015 Imaging    Initial PET Scan 1. Severe multifocal osseous metastatic disease, much greater than anticipated based on prior imaging. 2. Multifocal nodal metastases involving the right internal mammary, prevascular and subpectoral lymph nodes. No axillary pulmonary involvement identified. 3. No distant extra osseous metastases.    07/30/2015 - 11/29/2015 Chemotherapy    Docetaxel, Herceptin and Perjeta every 3 weeks X6 cycles, pt tolerated moderately well, restaging scan showed excellent response     09/18/2015 Imaging    CT Head w/wo Contrast 1. No acute intracranial abnormality or significant interval change. 2. Stable minimal periventricular white matter hypoattenuation on the right. This may reflect a remote ischemic injury. 3. No evidence for metastatic disease to the brain. 4. No focal soft tissue lesion to explain the patient's tenderness.    10/30/2015 Imaging    CT Abdomen Pelvis w/ Contrast 1. Few mildly prominent fluid-filled loops of small  bowel scattered throughout the abdomen, with intraluminal fluid density within the distal colon. Given the provided history, findings are suggestive of acute  enteritis/diarrheal illness. 2. No other acute intra-abdominal or pelvic process identified. 3. Widespread osseous metastatic disease, better evaluated on most recent PET-CT from 07/19/2015. No pathologic fracture or other complication. 4. 11 mm hypodensity within the left kidney. This lesion measures intermediate density, and is indeterminate. While this lesion is similar in size relative to recent studies, this is increased in size relative to prior study from 2012. Further evaluation with dedicated renal mass protocol CT and/or MRI is recommended for complete characterization.    12/25/2015 Imaging    Restaging PET Scan 1. Markedly improved skeletal activity, with only several faint foci of residual accentuated metabolic activity, but resolution of the vast majority of the previously extensive osseous metastatic disease. 2. Resolution of the prior right internal mammary and right prevascular lymph nodes. 3. Residual subcutaneous edema and edema along fascia planes in the left upper thigh. This remains somewhat more than I would expect for placement of an IM nail 6 weeks ago. If there is leg swelling further down, consider Doppler venous ultrasound to rule out left lower extremity DVT. I do not perceive an obvious difference in density between the pelvic and common femoral veins on the noncontrast CT data. 4. Chronic right maxillary and right sphenoid sinusitis.    12/2015 - 05/14/2016 Chemotherapy    Maintenance Herceptin and pejeta every 3 weeks stopped on 05/14/16 due to disease progression    01/16/2016 -  Anti-estrogen oral therapy    Letrozole 2.5 mg daily. She was switched to exemestane due to disease progression on 02/08/17.    03/01/2016 Miscellaneous    Patient presented to ED following a fall; she reports she landed on her back and is now experiencing back pain. The patient was evaluated and discharge home same day with pain control.    05/01/2016 Imaging    CT CAP IMPRESSION: 1. Stable  exam.  No new or progressive disease identified. 2. No evidence for residual or recurrent adenopathy within the chest. 3. Stable bone metastasis.    05/01/2016 Imaging    BONE SCAN IMPRESSION: 1. Small focal uptake involving the anterior rib ends of the left second and right fourth ribs, new since the prior bone scan. The location of this uptake is more suggestive of a traumatic or inflammatory etiology as opposed to metastatic disease. No other evidence of metastatic disease. 2. There are other areas of stable uptake which are most likely degenerative/reactive, including right shoulder and cervical spine uptake and uptake along the right femur adjacent to the ORIF hardware.    05/20/2016 Imaging    NM PET Skull to Thigh IMPRESSION: 1. There multiple metastatic foci in the bony pelvis, including some new abnormal foci compare to the prior PET-CT, compatible with mildly progressive bony metastatic disease. Also, a left T11 vertebral hypermetabolic metastatic lesion is observed, new compared to the prior exam. 2. No active extraosseous malignancy is currently identified. 3. Right anterior fourth rib healing fracture, appears be    06/04/2016 - 01/08/2017 Chemotherapy    Second line chemotherapy Kadcyla every 3 weeks started on 06/04/16 and stopped on 01/08/17 due to mixed response.      10/05/2016 Imaging    CT CAP and Bone Scan:  Bones: left intramedullary rod and left femoral head neck screw in place. In the proximal diaphysis of the left femur there is cortical thickening and irregularity. No lytic or  blastic bone lesions. No fracture or vertebral endplate destruction.   No definite evidence of recurrent disease in the chest, abdomen, or pelvis.    10/28/2016 Imaging    CT A/P IMPRESSION: No acute process demonstrated in the abdomen or pelvis. No evidence of bowel obstruction or inflammation.    11/04/2016 Imaging    DG foot complete left: IMPRESSION: No fracture or  dislocation.  No soft tissue abnormality    11/23/2016 Imaging    CT RIGHT FEMUR IMPRESSION: Negative CT scan of the right thigh.  No visible metastatic disease.  CT LEFT FEMUR IMPRESSION: 1. Postsurgical changes related to prior cephalomedullary rod fixation of the left femur with linear lucency along the anterolateral proximal femoral cortex at level of the lesser trochanter, suspicious for nondisplaced fracture. 2. Slightly permeative appearance of the proximal left femoral cortex at the site of known prior osseous metastases is largely unchanged. 3. Unchanged patchy lucency and sclerosis along the anterior acetabulum, which appears to correspond with an area of faint uptake on the prior PET-CT, suspicious for osseous metastasis. These results will be called to the ordering clinician or representative by the Radiologist Assistant, and communication documented in the PACS or zVision Dashboard.      01/27/2017 Imaging    IMPRESSION: 1. Mixed response to osseous metastasis. Some hypermetabolic lesions are new and increasingly hypermetabolic, while another index lesion is decreasingly hypermetabolic. 2. No evidence of hypermetabolic soft tissue metastasis.     02/08/2017 -  Antibody Plan    -Exemestane 25 mg daily since 02/08/17 -Herceptin every 3 week since 02/08/17 -Oral Lanpantinib 1525m and decreased to 5017mdue to diarrhea and depression. Due to disease progression she swithced to Verzenio 15080mn 05/04/17. She did not toelrate well so she was switched to Ibrance 100m24m 05/31/17. Due to her neutropenia, Ibrance dose reduced to 75 mg daily, 3 weeks on, one-week off.     04/30/2017 PET scan    IMPRESSION: 1. Primarily increase in hypermetabolism of osseous metastasis. An isolated right acetabular lesion is decreased in hypermetabolism since the prior exam. 2. No evidence of hypermetabolic soft tissue metastasis. 3. Similar non FDG avid right-sided thyroid nodule, favoring  a benign etiology. Recommend attention on follow-up.    08/30/2017 PET scan    08/30/2017 PET Scan  IMPRESSION: 1. Worsening osseous metastatic disease. 2. Focal hypermetabolism in the central spinal canal the L1 level, similar to the prior exam. Metastatic implant cannot be excluded. 3. Slight enlargement of a right thyroid nodule which now appears more cystic in character. Consider further evaluation with thyroid ultrasound. If patient is clinically hyperthyroid, consider nuclear medicine thyroid uptake and scan    08/30/2017 Progression    08/30/2017 PET Scan  IMPRESSION: 1. Worsening osseous metastatic disease. 2. Focal hypermetabolism in the central spinal canal the L1 level, similar to the prior exam. Metastatic implant cannot be excluded. 3. Slight enlargement of a right thyroid nodule which now appears more cystic in character. Consider further evaluation with thyroid ultrasound. If patient is clinically hyperthyroid, consider nuclear medicine thyroid uptake and scan     HISTORY OF PRESENTING ILLNESS (From Dr. LiveMariana Kaufmane on 02/06/2016):  Debra Boniery87. 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 She has had an excellent response to 6 cycles of taxotere herceptin perjeta from 07-30-15 thru 11-29-15. She is continuing herceptin and perjeta every 3 weeks. She had extreme local pain with first  xgeva injection on 01-16-16 and prefers resuming previous zometa. She began letrozole ~ 01-16-16, has no complaints about this at all.  Last imaging was PET 12-25-15.  NOTE CTs, plain Xrays and bone scan did NOT show any of the extensively metastatic bone involvement 06-2015; MRI and PET did image disease clearly.  Patient continues to do very well overall. MRSA skin lesions on scalp and face improved quickly with doxycycline starting 01-16-16. She continues to use Dial soap and we have  again strongly encouraged good hygiene with other family members at home. She has had the 3 lymphedema PT sessions allowed by insurance, continues to do massage for LLE herself, still has not gotten the ordered compression apparatus for home and will follow up with PT on that. Swelling in LLE has improved with the interventions and does not seem as uncomfortable. She denies any pain. Appetite is excellent, bowels fine, no SOB, good energy, no problems with PAC, no noted changes in left breast. No bleeding. No fever or symptoms of infection. She slipped in tub last week, fell onto right arm fortunately without injury. She was upset by a visitor and shaky then, but no tremors otherwise.   She has two children and 4 grandchildren. She has had genetic testing and it was negative. She lives at home with her husband. She is very active. Her children live close to her.   CURRENT THERAPY:    -Exemestane 4m daily starting 02/08/17 -Herceptin every 3 weeks starting 02/08/17 -Ibrance 1090mstarted on 05/31/17. Held for 1 week on 06/14/17 due to pancytopenia. Restarted her at 7584maily, 3 weeks on and one week off. Stopped on 08/31/2017 due to disease progression.  INTERIM HISTORY:   WenLAURENE [name] 37o. female returns today for follow up. She is here with her interpreter. She feels well, but still complains of leg pain. She denies back or pelvic pain or numbness, tingling or weakness in her LLs. She sometimes has gait difficulties. She sometimes experiences right shoulder pain. She had lots of pain during radiation and is anxious about the possibility of more radiation. She also reports scalp sensitivity.   MEDICAL HISTORY:  Past Medical History:  Diagnosis Date  . Abnormal Pap smear   . Anxiety   . [place]   breast-s/p mastectomy, chemo, rad  . CIN I (cervical intraepithelial neoplasia I) 2003   by colpo  . Congenital deafness   . Deaf   . Depression   . Radiation  07/12/15-07/26/15   left femur 30 Gy  . S/P bilateral oophorectomy     SURGICAL HISTORY: Past Surgical History:  Procedure Laterality Date  . ABDOMINAL HYSTERECTOMY    . INTRAMEDULLARY (IM) NAIL INTERTROCHANTERIC Left 06/26/2015   Procedure: LEFT BIOMET LONG AFFIXS NAIL;  Surgeon: MarMarybelle KillingsD;  [facility] [place];  Laterality: Left;  . IR GENERIC HISTORICAL  08/06/2015   IR US [place]IDE VASC ACCESS LEFT 08/06/2015 GleAletta EdouardD [facility]INTER[facility]  . IR GENERIC HISTORICAL  08/06/2015   IR FLUORO GUIDE CV LINE LEFT 08/06/2015 GleAletta EdouardD [facility]  . IR GENERIC HISTORICAL  03/05/2016   IR CV LINE INJECTION 03/05/2016 AdaMarkus DaftD [facility]  . MASTECTOMY     right breast  . MASTECTOMY    . OVARIAN CYST REMOVAL    . RADIOLOGY WITH ANESTHESIA Left 06/25/2015   Procedure: MRI OF LEFT HIP WITH OR WITHOUT CONTRAST;  Surgeon: Medication Radiologist,  MD;  Location: [facility];  Service: Radiology;  Laterality: Left;  DR. MCINTYRE/MRI  . TUBAL LIGATION      SOCIAL HISTORY: Social History   Socioeconomic History  . Marital status: Married    Spouse name: Not on file  . Number of children: 2  . Years of education: Not on file  . Highest education level: Not on file  Occupational History  . Not on file  Social Needs  . Financial resource strain: Not on file  . Food insecurity:    Worry: Not on file    Inability: Not on file  . Transportation needs:    Medical: Not on file    Non-medical: Not on file  Tobacco Use  . Smoking status: Never Smoker  . Smokeless tobacco: Never Used  Substance and Sexual Activity  . Alcohol use: No  . Drug use: No  . Sexual activity: Yes    Birth control/protection: Surgical  Lifestyle  . Physical activity:    Days per week: Not on file    Minutes per session: Not on file  . Stress: Not on file  Relationships  . Social connections:    Talks on phone: Not on file    Gets together: Not on file    Attends religious service:  Not on file    Active member of club or organization: Not on file    Attends meetings of clubs or organizations: Not on file    Relationship status: Not on file  . Intimate partner violence:    Fear of current or ex partner: Not on file    Emotionally abused: Not on file    Physically abused: Not on file    Forced sexual activity: Not on file  Other Topics Concern  . Not on file  Social History Narrative  . Not on file    FAMILY HISTORY: Family History  Problem Relation Age of Onset  . Hypertension Mother   . Seizures Mother   . Alzheimer's disease Maternal Uncle   . Pancreatitis Maternal Grandmother   . Heart attack Maternal Grandfather     ALLERGIES:  is allergic to chlorhexidine; promethazine hcl; and diphenhydramine hcl.  MEDICATIONS:  Current Outpatient Medications  Medication Sig Dispense Refill  . Cholecalciferol 2000 units CAPS Take 1 capsule by mouth daily.    . divalproex (DEPAKOTE) 250 MG DR tablet Take 1 tablet (250 mg total) by mouth at bedtime. 30 tablet 2  . exemestane (AROMASIN) 25 MG tablet Take 1 tablet (25 mg total) by mouth daily after breakfast. 30 tablet 0  . HYDROcodone-acetaminophen (NORCO) 10-325 MG tablet Take 1 tablet by mouth every 6 (six) hours as needed. 45 tablet 0  . lidocaine-prilocaine (EMLA) cream Apply 1 application topically as needed. Apply to Porta-Cath 1-2 hours prior to access as directed. 30 g 2  . Neratinib Maleate (NERLYNX) 40 MG tablet Start at 3 tab daily, then increase 1 tab a week, until 6 tab daily if tolerates well. Take with food. 120 tablet 0  . palbociclib (IBRANCE) 75 MG capsule TAKE 1 CAPSULE (75 MG TOTAL) BY MOUTH DAILY WITH BREAKFAST. TAKE WHOLE WITH FOOD. TAKE FOR 21 DAYS ON, 7 DAYS OFF, REPEAT EVERY 28 DAYS. 21 capsule 1  . potassium chloride (K-DUR) 10 MEQ tablet Take 10 mEq by mouth daily. Take 2 tablets daily.    . prochlorperazine (COMPAZINE) 10 MG tablet Take 1 tablet (10 mg total) by mouth every 6 (six) hours as  needed for nausea or  vomiting. (Patient not taking: Reported on 08/23/2017) 30 tablet 2   No current facility-administered medications for this visit.    Facility-Administered Medications Ordered in Other Visits  Medication Dose Route Frequency Provider Last Rate Last Dose  . fludeoxyglucose F - 18 (FDG) injection 6.3 millicurie  6.3 millicurie Intravenous Once PRN Margarette Canada, MD      . sodium chloride 0.9 % 1,000 mL with potassium chloride 10 mEq infusion   Intravenous Continuous Gordy Levan, MD   Stopped at 09/10/15 1653  . sodium chloride 0.9 % injection 10 mL  10 mL Intravenous PRN Livesay, Lennis P, MD        REVIEW OF SYSTEMS:  Constitutional: Denies fevers, chills or abnormal night sweats , low appetite from stress , weight stable (+) scalp sensitivity  Eyes: Denies blurriness of vision, double vision or watery eyes Ears, nose, mouth, throat, and face: Denies mucositis or sore throat (+) deaf Respiratory: Denies cough, dyspnea or wheezes (+) Mild dyspnea  Cardiovascular: Denies palpitation, chest discomfort or lower extremity swelling Skin: No rashes or discolorations  Gastrointestinal:  Denies nausea    Lymphatics: Denies new lymphadenopathy or easy bruising. MSK: (+) left Leg cramps and pain and right shoulder pain (5-8/10) Behavioral/Psych: (+) stress, depression All other systems were reviewed with the patient and are negative.  PHYSICAL EXAMINATION: ECOG PERFORMANCE STATUS: 1 Vitals:   08/31/17 1214  BP: 110/64  Pulse: 62  Resp: 20  Temp: 97.9 F (36.6 C)  TempSrc: Oral  SpO2: 98%  Weight: 126 lb 3.2 oz (57.2 kg)  Height: 5' 7"  (1.702 m)    GENERAL:alert, no distress and comfortable  EYES: normal, conjunctiva are pink and non-injected, sclera clear OROPHARYNX:no exudate, no erythema and lips, buccal mucosa, and tongue normal  NECK: supple, thyroid normal size, non-tender, without nodularity LYMPH:  no palpable lymphadenopathy in the cervical, axillary or  inguinal LUNGS: clear to auscultation and percussion with normal breathing effort HEART: regular rate & rhythm and no murmurs and no lower extremity edema ABDOMEN:abdomen soft, non-tender and normal bowel sounds. No organomegaly. Musculoskeletal:no cyanosis of digits and no clubbing  PSYCH: alert & oriented x 3 with fluent speech NEURO: no focal motor/sensory deficits Breasts: Breast inspection showed Right breast is surgically absent status post reconstruction. Palpation of the breasts and axilla revealed no obvious mass that I could appreciate.   LABORATORY DATA:  I have reviewed the data as listed CBC Latest Ref Rng & Units 08/31/2017 08/23/2017 08/09/2017  WBC 3.9 - 10.3 K/uL 3.0(L) 2.8(L) 3.2(L)  Hemoglobin 11.6 - 15.9 g/dL 11.1(L) 11.1(L) 11.8  Hematocrit 34.8 - 46.6 % 31.6(L) 31.8(L) 33.8(L)  Platelets 145 - 400 K/uL 237 128(L) 200   CMP Latest Ref Rng & Units 08/31/2017 08/23/2017 08/09/2017  Glucose 70 - 99 mg/dL 86 91 77  BUN 6 - 20 mg/dL 15 11 13   Creatinine 0.44 - 1.00 mg/dL 1.16(H) 0.94 1.00  Sodium 135 - 145 mmol/L 140 140 140  Potassium 3.5 - 5.1 mmol/L 3.8 3.4(L) 4.1  Chloride 98 - 111 mmol/L 104 105 106  CO2 22 - 32 mmol/L 30 24 27   Calcium 8.9 - 10.3 mg/dL 8.8(L) 9.1 9.7  Total Protein 6.5 - 8.1 g/dL 6.8 6.9 7.5  Total Bilirubin 0.3 - 1.2 mg/dL 2.8(H) 1.9(H) 2.2(H)  Alkaline Phos 38 - 126 U/L 65 62 68  AST 15 - 41 U/L 17 15 15   ALT 0 - 44 U/L 12 11 11    ANC 0.9K today   TUMOR  MARKER  CA 27.29 Results for AMARYA, KUEHL (MRN [id number]) as of 08/26/2017 16:56  Ref. Range 03/22/2017 10:06 05/03/2017 08:55 06/14/2017 08:06 07/19/2017 13:36 08/23/2017 08:12  CA 27.29 Latest Ref Range: 0.0 - 38.6 U/mL 36.5 29.3 35.6 49.2 (H) 62.0 (H)    PROCEDURES:   RADIOGRAPHIC STUDIES:  08/30/2017 PET Scan  IMPRESSION: 1. Worsening osseous metastatic disease. 2. Focal hypermetabolism in the central spinal canal the L1 level, similar to the prior exam. Metastatic implant cannot  be excluded. 3. Slight enlargement of a right thyroid nodule which now appears more cystic in character. Consider further evaluation with thyroid ultrasound. If patient is clinically hyperthyroid, consider nuclear medicine thyroid uptake and scan.  PET Scan 04/30/17 IMPRESSION: 1. Primarily increase in hypermetabolism of osseous metastasis. An isolated right acetabular lesion is decreased in hypermetabolism since the prior exam. 2. No evidence of hypermetabolic soft tissue metastasis. 3. Similar non FDG avid right-sided thyroid nodule, favoring a benign etiology. Recommend attention on follow-up.  PET SCAN: 01/27/17 IMPRESSION: 1. Mixed response to osseous metastasis. Some hypermetabolic lesions are new and increasingly hypermetabolic, while another index lesion is decreasingly hypermetabolic. 2. No evidence of hypermetabolic soft tissue metastasis.  CT A/P IMPRESSION: 10/28/16 No acute process demonstrated in the abdomen or pelvis. No evidence of bowel obstruction or inflammation.  CT CAP and Bone Scan: 10/05/16  Bones: left intramedullary rod and left femoral head neck screw in place. In the proximal diaphysis of the left femur there is cortical thickening and irregularity. No lytic or blastic bone lesions. No fracture or vertebral endplate destruction.  No definite evidence of recurrent disease in the chest, abdomen, or pelvis.  PET 05/20/2016 IMPRESSION: 1. There multiple metastatic foci in the bony pelvis, including some new abnormal foci compare to the prior PET-CT, compatible with mildly progressive bony metastatic disease. Also, a left T11 vertebral hypermetabolic metastatic lesion is observed, new compared to the prior exam. 2. No active extraosseous malignancy is currently identified. 3. Right anterior fourth rib healing fracture, appears benign.  NM Bone Scan 05/01/2016 IMPRESSION: 1. Small focal uptake involving the anterior rib ends of the left second and right fourth ribs,  new since the prior bone scan. The location of this uptake is more suggestive of a traumatic or inflammatory etiology as opposed to metastatic disease. No other evidence of metastatic disease. 2. There are other areas of stable uptake which are most likely degenerative/reactive, including right shoulder and cervical spine uptake and uptake along the right femur adjacent to the ORIF Hardware.  CT CAP 05/01/2016 IMPRESSION: 1. Stable exam.  No new or progressive disease identified. 2. No evidence for residual or recurrent adenopathy within the chest. 3. Stable bone metastasis.  I have personally reviewed the radiological images as listed and agreed with the findings in the report.  ASSESSMENT & PLAN:  Mrs. Piechocki is a 52 y.o. postmenopause female with:  1. 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 -Restaging PET 12-25-15 markedly improved of bone metastasis after initial systemic chemo  -she was switched to letrozole and maintenance Herceptin and the perjeta on 01/16/16 which stopped on 05/14/16.  -Did not tolerate SQ xgeva and it was changed to zometa in 06/2015 -Continue monitoring ECHO every 3 months when she is on herceptin -I previously reviewed her CT scan and bone scan from 05/01/2016, which showed stable disease overall, a few new uptake of  anterior ribs on bone scan are indeterminate. -I previously discussed her restaging PET scan from 05/20/2016. Unfortunately it showed mild disease progression of her bone metastasis  -I have changed her treatment to second line Kadcyla and letrozole on 06/04/16, she is tolerating very well. She declined other chemo.  -We previously received records from [facility] in [place], [place]. She underwent CT CAP w contrast on 10/02/16 and bone scan on 09/21/16. CT shows no evidence of recurrent disease in the  chest, abdomen, or pelvis; Bone scan indicates no lytic or blastic bone lesions.  -She has been on Zometa every 4-6 weeks since June 2017. We previously changed to every 3 months in 12/2016. 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  No other visceral organ metastasis. She is clinically doing well.  -Kadcyla was switched to Exemestane, oral HER2 antibody treatment lapatinib and herceptin starting 02/08/17. -She had severe side effects of diarrhea, depression and low appetite with lapatinib on full dose of 1500 mg after 1 week and she stopped. I recommended her to restart with low dose, and she can only tolerate low dose 500 mg of lapatinib, which she has been on -Her PET Scan from 04/30/17 revealed a primarily increase in hypermetabolism of osseous metastasis. There are no new lesions.  I reviewed the images and compared to the previous images in person with patient and her husband. I suspect her progression is due to her not being able to fully tolerate lapatinib -I previously discussed options with clinical trial for the new Her2 antibody conjugate Trastuzumab deruxtecan, the phase 3 clinical trial has been open, and the closest site is in [place] and [employer]. She is not interested in going to [place] at this time. -She started Verzenio 155m on 05/04/17 she has not tolerated well given her diarrhea, headaches, taste change and low appetite from taste change.  -I switched her to Ibrance 1054mand she started on 05/31/17. She has tolerated well with mild diarrhea and some skin itching.  -Due to her neutropenia, Ibrance dose reduced to 75 mg daily, 3 weeks on, one-week off, she is tolerating well with mild fatigue.  -Her medicaid insurance card is inactive and she was not able  to pick up refill of Ibrance. -Her 08/30/2017 PET Scan showed disease progression in her bones, no other new visceral metastasis. I reviewed her PET images with her in person. She denies hip or pelvic pain and has stable right shoulder and left hip pain from before.  -Will stop Ibrance due to disease progression.  I discussed next line, treatment options, including chemo, oral HER2 antiboday Neratinib in combination with Xeloda (based on the phase III NALA trial, which were presented at the 2019 ASCO Annual Meeting), and switching antiestrogen therapy such as fulvestrant injection.  Patient is reluctant to have intravenous chemo due to the fear of side effects and hair loss.  Recommend her to switch to neratinib and Xeloda.  Due to her poor tolerance to lapatinib and Xeloda in the past, I will start her on the right nipple first, with dose gradually increasing from 120 mg to 4 dose to 240 mg in 4 weeks (increase 1 tab weekly), and with prophylactic imodium.  -If she tolerates Neratinib well, will add Xeloda at low dose, and stop Exemestane at  that time  -Continue Exemestane daily for now. I refilled today -Labs reviewed, mild pancytopenia due to Ibrance, potassium at 3.8, total bili at 2.8.  -will proceed Herceptin today and one more cycle in 3 weeks, then stop if she tolerates Neratinib    2. History of MRSA skin infection scalp, boil at low back scalp and upper lip -I prescribed doxycycline on 04/24/16 for 7 days  -Resolved now.  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  -Husband also deaf.  4. premature surgical menopause -post BSO 2071fr pelvic pain and ovarian cysts, likely was also beneficial from standpoint of the breast cancer. -hysterectomy   5. Intermittent anemia  -mild, possible related to prior chemo -She is asymptomatic, we'll continue close monitoring -Hg at 11.1 today    6. Hypokalemia  -She has had intermittent hypokalemia -Continue on  potassium 20 mEq daily.  -K increased to 3.8  7. Goal of care discussion  -We previously discussed the incurable nature of her cancer, and the overall poor prognosis, especially if she does not have good response to chemotherapy or progress on chemo.  -The patient understands the goal of care is palliative. -she is full code for now   8. Bilateral Leg Pain -She initially presented with worsening left leg and hip pain secondary to bone mets, she had surgery before, this was much improved after her prior chemo -[place] 1 tablet every 6 hours as needed for pain. Which helped. -I previously encouraged her to follow up with Dr. YLorin Mercyabout the pain in her left leg. 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 She will continue calcium and Norco as needed. I suggest she try tylenol for pain during the day. She is willing to try.   9. Insomnia, Depression, Stress  -I previously gave her Ambien 573m but it did not work well.  -continue ativan 1 mg to take at night as needed  -mirtazapine did not work, she also tried trazodone from PCP which gave her a rash. She stopped taking it.  -After the death of her dog she has been stressed and sad from how it happened.  -I previously offered her the chance to speak with our counselor or a therapist, she declined at this time. She denied inclinations of self harm.  -I also previously offered her the choice of antidepressants. She will try coping without medication for now. I previously encouraged her to speak with family or friends about her feelings.   10.  Hyperbilirubinemia  -She has developed mild hyperbilirubinemia recently, TBIL 1.2-2.7, okay to continue treatment  -increased to 2.8 today   11. Mild dyspnea  -Her 08/04/17 ECHO shows mostly adequate heart function -If her breathing worsens I  encouraged her to contact the clinic  -Her lung exam today was unremarkable, oxygen level is normal, will continue monitoring    Plan: -Labs and PET scan reviewed, unfortunately she has disease progression in bones -Will stop Ibrance -Continue Exemestane daily. Refilled today  -I ordered Neratinib, she will start when she receives the medication, stress at 120 mg daily, increase 1 tablet (4077mevery week if she tolerates, to full dose at 240 mg daily -Our oral pharmacist JesEvelena Peatd education of Neratinib today  -I will see her back in 3 weeks with Herceptin   Pt had many questions. All questions were answered.  The patient knows to call the clinic with any problems, questions or concerns.  I spent 30 minutes counseling the patient face to face. The total time spent in the appointment was 40 minutes and more than 50% was on counseling.  Dierdre Searles Dweik am acting as scribe for Dr. Truitt Merle.  I have reviewed the above documentation for accuracy and completeness, and I agree with the above.     Truitt Merle, MD 08/31/2017

## 2017-08-30 ENCOUNTER — Encounter (HOSPITAL_COMMUNITY)
Admission: RE | Admit: 2017-08-30 | Discharge: 2017-08-30 | Disposition: A | Discharge status: Home / Self Care | Payer: Medicaid Other | Source: Ambulatory Visit | Attending: Hematology | Admitting: Hematology

## 2017-08-30 DIAGNOSIS — C50911 Malignant neoplasm of unspecified site of right female breast: Secondary | ICD-10-CM | POA: Diagnosis not present

## 2017-08-30 DIAGNOSIS — C7951 Secondary malignant neoplasm of bone: Secondary | ICD-10-CM | POA: Insufficient documentation

## 2017-08-30 DIAGNOSIS — C50919 Malignant neoplasm of unspecified site of unspecified female breast: Secondary | ICD-10-CM | POA: Diagnosis not present

## 2017-08-30 LAB — GLUCOSE, CAPILLARY: Glucose-Capillary: 85 mg/dL (ref 70–99)

## 2017-08-30 MED ORDER — FLUDEOXYGLUCOSE F - 18 (FDG) INJECTION: 6.3000 | Freq: Once | INTRAVENOUS | Status: DC | PRN

## 2017-08-31 ENCOUNTER — Encounter: Payer: Self-pay | Admitting: Medical Oncology

## 2017-08-31 ENCOUNTER — Inpatient Hospital Stay: Payer: Medicaid Other

## 2017-08-31 ENCOUNTER — Telehealth: Payer: Self-pay | Admitting: Medical Oncology

## 2017-08-31 ENCOUNTER — Inpatient Hospital Stay (HOSPITAL_BASED_OUTPATIENT_CLINIC_OR_DEPARTMENT_OTHER): Payer: Medicaid Other | Admitting: Hematology

## 2017-08-31 ENCOUNTER — Telehealth: Payer: Self-pay | Admitting: Pharmacist

## 2017-08-31 ENCOUNTER — Telehealth: Payer: Self-pay | Admitting: Hematology

## 2017-08-31 DIAGNOSIS — Z79811 Long term (current) use of aromatase inhibitors: Secondary | ICD-10-CM

## 2017-08-31 DIAGNOSIS — Z5112 Encounter for antineoplastic immunotherapy: Secondary | ICD-10-CM | POA: Diagnosis not present

## 2017-08-31 DIAGNOSIS — H905 Unspecified sensorineural hearing loss: Secondary | ICD-10-CM

## 2017-08-31 DIAGNOSIS — Z170 Estrogen receptor positive status [ER+]: Secondary | ICD-10-CM | POA: Diagnosis not present

## 2017-08-31 DIAGNOSIS — C50911 Malignant neoplasm of unspecified site of right female breast: Secondary | ICD-10-CM

## 2017-08-31 DIAGNOSIS — Z79899 Other long term (current) drug therapy: Secondary | ICD-10-CM | POA: Diagnosis not present

## 2017-08-31 DIAGNOSIS — D61818 Other pancytopenia: Secondary | ICD-10-CM

## 2017-08-31 DIAGNOSIS — C50919 Malignant neoplasm of unspecified site of unspecified female breast: Secondary | ICD-10-CM

## 2017-08-31 DIAGNOSIS — R42 Dizziness and giddiness: Secondary | ICD-10-CM

## 2017-08-31 DIAGNOSIS — D649 Anemia, unspecified: Secondary | ICD-10-CM

## 2017-08-31 DIAGNOSIS — C7951 Secondary malignant neoplasm of bone: Principal | ICD-10-CM

## 2017-08-31 DIAGNOSIS — C799 Secondary malignant neoplasm of unspecified site: Principal | ICD-10-CM

## 2017-08-31 DIAGNOSIS — Z8614 Personal history of Methicillin resistant Staphylococcus aureus infection: Secondary | ICD-10-CM | POA: Diagnosis not present

## 2017-08-31 DIAGNOSIS — E876 Hypokalemia: Secondary | ICD-10-CM

## 2017-08-31 LAB — CBC WITH DIFFERENTIAL/PLATELET
Basophils Absolute: 0 10*3/uL (ref 0.0–0.1)
Basophils Relative: 1 %
EOS ABS: 0 10*3/uL (ref 0.0–0.5)
Eosinophils Relative: 0 %
HCT: 31.6 % — ABNORMAL LOW (ref 34.8–46.6)
Hemoglobin: 11.1 g/dL — ABNORMAL LOW (ref 11.6–15.9)
LYMPHS ABS: 1.1 10*3/uL (ref 0.9–3.3)
Lymphocytes Relative: 36 %
MCH: 36.2 pg — AB (ref 25.1–34.0)
MCHC: 35.1 g/dL (ref 31.5–36.0)
MCV: 103.1 fL — ABNORMAL HIGH (ref 79.5–101.0)
MONO ABS: 0.1 10*3/uL (ref 0.1–0.9)
MONOS PCT: 5 %
Neutro Abs: 1.7 10*3/uL (ref 1.5–6.5)
Neutrophils Relative %: 58 %
PLATELETS: 237 10*3/uL (ref 145–400)
RBC: 3.06 MIL/uL — ABNORMAL LOW (ref 3.70–5.45)
RDW: 13.3 % (ref 11.2–14.5)
WBC: 3 10*3/uL — ABNORMAL LOW (ref 3.9–10.3)

## 2017-08-31 LAB — COMPREHENSIVE METABOLIC PANEL
ALK PHOS: 65 U/L (ref 38–126)
ALT: 12 U/L (ref 0–44)
ANION GAP: 6 (ref 5–15)
AST: 17 U/L (ref 15–41)
Albumin: 3.7 g/dL (ref 3.5–5.0)
BUN: 15 mg/dL (ref 6–20)
CALCIUM: 8.8 mg/dL — AB (ref 8.9–10.3)
CHLORIDE: 104 mmol/L (ref 98–111)
CO2: 30 mmol/L (ref 22–32)
Creatinine, Ser: 1.16 mg/dL — ABNORMAL HIGH (ref 0.44–1.00)
GFR calc non Af Amer: 54 mL/min — ABNORMAL LOW (ref >60)
Glucose, Bld: 86 mg/dL (ref 70–99)
Potassium: 3.8 mmol/L (ref 3.5–5.1)
SODIUM: 140 mmol/L (ref 135–145)
Total Bilirubin: 2.8 mg/dL — ABNORMAL HIGH (ref 0.3–1.2)
Total Protein: 6.8 g/dL (ref 6.5–8.1)

## 2017-08-31 MED ORDER — SODIUM CHLORIDE 0.9% FLUSH
10.0000 mL | INTRAVENOUS | Status: DC | PRN
  Administered 2017-08-31: 10 mL
  Filled 2017-08-31: qty 10

## 2017-08-31 MED ORDER — LORATADINE 10 MG PO TABS
ORAL_TABLET | ORAL | Status: AC
  Filled 2017-08-31: qty 1

## 2017-08-31 MED ORDER — HEPARIN SOD (PORK) LOCK FLUSH 100 UNIT/ML IV SOLN
500.0000 [IU] | Freq: Once | INTRAVENOUS | Status: AC | PRN
  Administered 2017-08-31: 500 [IU]
  Filled 2017-08-31: qty 5

## 2017-08-31 MED ORDER — LORATADINE 10 MG PO TABS
10.0000 mg | ORAL_TABLET | Freq: Every day | ORAL | Status: DC
  Administered 2017-08-31: 10 mg via ORAL

## 2017-08-31 MED ORDER — DIVALPROEX SODIUM 250 MG PO DR TAB: 250.0000 mg | DELAYED_RELEASE_TABLET | Freq: Every day | ORAL | 2 refills | Status: DC

## 2017-08-31 MED ORDER — ACETAMINOPHEN 325 MG PO TABS
ORAL_TABLET | ORAL | Status: AC
  Filled 2017-08-31: qty 2

## 2017-08-31 MED ORDER — SODIUM CHLORIDE 0.9 % IV SOLN
Freq: Once | INTRAVENOUS | Status: AC
  Administered 2017-08-31: 13:00:00 via INTRAVENOUS
  Filled 2017-08-31: qty 250

## 2017-08-31 MED ORDER — TRASTUZUMAB CHEMO 150 MG IV SOLR
6.0000 mg/kg | Freq: Once | INTRAVENOUS | Status: AC
  Administered 2017-08-31: 357 mg via INTRAVENOUS
  Filled 2017-08-31: qty 17

## 2017-08-31 MED ORDER — ACETAMINOPHEN 325 MG PO TABS
650.0000 mg | ORAL_TABLET | Freq: Once | ORAL | Status: AC
  Administered 2017-08-31: 650 mg via ORAL

## 2017-08-31 MED ORDER — NERATINIB MALEATE 40 MG PO TABS: ORAL_TABLET | ORAL | 0 refills | Status: DC

## 2017-08-31 MED ORDER — EXEMESTANE 25 MG PO TABS: 25.0000 mg | ORAL_TABLET | Freq: Every day | ORAL | 0 refills | Status: DC

## 2017-08-31 NOTE — Telephone Encounter (Addendum)
Oral Chemotherapy Pharmacist Encounter   I spoke with patient with the aid of interpreter in infusion room for overview of: Nerlynx (neratinib).  Last Herceptin infusion planned for: 09/21/2017   Counseled patient on administration, dosing, side effects, monitoring, drug-food interactions, safe handling, storage, and disposal.  Prescribing information for Nerlynx recommends dose initiation at full dose, 6 tablets (240 mg) once daily, with the use of scheduled loperamide antidiarrheal prophylaxis for the first 8 weeks of therapy.  Nerlynx will be initiated on a dose titration schedule.   Week 1: Patient will take Nerlynx 40mg  tablets, 3 tablets (120mg ) by mouth once daily with food Week 2: Patient will take Nerlynx 40mg  tablets, 4 tablets (160mg ) by mouth once daily with food Week 3: Patient will take Nerlynx 40mg  tablets, 5 tablets (200mg ) by mouth once daily with food Week 4: Patient will take Nerlynx 40mg  tablets, 6 tablets (240mg ) by mouth once daily with food. 240 mg once daily is the target dose of Nerlynx.  Patient knows to avoid grapefruit and grapefruit juice while on treatment with Nerlynx.  Patient instructed to avoid use of PPIs or H2RAs while on treatment with Nerlynx, can separate antacids from Nerlynx by 3 hours if acid suppression is needed.  Nerlynx start date: TBD, pending medication acquisition  Adverse effects include but are not limited to: diarrhea, nausea, vomiting, mouth sores, fatigue, rash, and abdominal pain.    Patient instructed to increase or decrease loperamide dose to maintain 1-2 bowel movements per day.  Reviewed with patient importance of keeping a medication schedule and plan for any missed doses.  [name] voiced understanding and appreciation.   All questions answered. Medication reconciliation performed and medication/allergy list updated.  Will follow up with patient regarding insurance and dispensing pharmacy.   Patient knows to call  the office with questions or concerns. Oral Oncology Clinic will continue to follow.  Thank you,  Johny Drilling, PharmD, BCPS, BCOP  08/31/2017   3:50 PM Oral Oncology Clinic [phone]

## 2017-08-31 NOTE — Patient Instructions (Signed)
Trastuzumab injection for infusion What is this medicine? TRASTUZUMAB (tras TOO zoo mab) is a monoclonal antibody. It is used to treat breast cancer and stomach cancer. This medicine may be used for other purposes; ask your health care provider or pharmacist if you have questions. COMMON BRAND NAME(S): Herceptin What should I tell my health care provider before I take this medicine? They need to know if you have any of these conditions: -heart disease -heart failure -lung or breathing disease, like asthma -an unusual or allergic reaction to trastuzumab, benzyl alcohol, or other medications, foods, dyes, or preservatives -pregnant or trying to get pregnant -breast-feeding How should I use this medicine? This drug is given as an infusion into a vein. It is administered in a hospital or clinic by a specially trained health care professional. Talk to your pediatrician regarding the use of this medicine in children. This medicine is not approved for use in children. Overdosage: If you think you have taken too much of this medicine contact a poison control center or emergency room at once. NOTE: This medicine is only for you. Do not share this medicine with others. What if I miss a dose? It is important not to miss a dose. Call your doctor or health care professional if you are unable to keep an appointment. What may interact with this medicine? This medicine may interact with the following medications: -certain types of chemotherapy, such as daunorubicin, doxorubicin, epirubicin, and idarubicin This list may not describe all possible interactions. Give your health care provider a list of all the medicines, herbs, non-prescription drugs, or dietary supplements you use. Also tell them if you smoke, drink alcohol, or use illegal drugs. Some items may interact with your medicine. What should I watch for while using this medicine? Visit your doctor for checks on your progress. Report any side effects.  Continue your course of treatment even though you feel ill unless your doctor tells you to stop. Call your doctor or health care professional for advice if you get a fever, chills or sore throat, or other symptoms of a cold or flu. Do not treat yourself. Try to avoid being around people who are sick. You may experience fever, chills and shaking during your first infusion. These effects are usually mild and can be treated with other medicines. Report any side effects during the infusion to your health care professional. Fever and chills usually do not happen with later infusions. Do not become pregnant while taking this medicine or for 7 months after stopping it. Women should inform their doctor if they wish to become pregnant or think they might be pregnant. Women of child-bearing potential will need to have a negative pregnancy test before starting this medicine. There is a potential for serious side effects to an unborn child. Talk to your health care professional or pharmacist for more information. Do not breast-feed an infant while taking this medicine or for 7 months after stopping it. Women must use effective birth control with this medicine. What side effects may I notice from receiving this medicine? Side effects that you should report to your doctor or health care professional as soon as possible: -allergic reactions like skin rash, itching or hives, swelling of the face, lips, or tongue -chest pain or palpitations -cough -dizziness -feeling faint or lightheaded, falls -fever -general ill feeling or flu-like symptoms -signs of worsening heart failure like breathing problems; swelling in your legs and feet -unusually weak or tired Side effects that usually do not require medical   attention (report to your doctor or health care professional if they continue or are bothersome): -bone pain -changes in taste -diarrhea -joint pain -nausea/vomiting -weight loss This list may not describe all  possible side effects. Call your doctor for medical advice about side effects. You may report side effects to FDA at 1-800-FDA-1088. Where should I keep my medicine? This drug is given in a hospital or clinic and will not be stored at home. NOTE: This sheet is a summary. It may not cover all possible information. If you have questions about this medicine, talk to your doctor, pharmacist, or health care provider.  2018 Elsevier/Gold Standard (2015-12-24 14:37:52)  

## 2017-08-31 NOTE — Telephone Encounter (Signed)
Oral Oncology Pharmacist Encounter  Received new prescription for Nerlynx (neratinib) for the treatment of metastatic, hormone receptor positive, Her-2 receptor positive breast cancer in conjunction with Xeloda (capecitabine), planned duration until disease progression or unacceptable toxicity. Xeloda will be added to Nerlynx therapy after ~ 1 month to ensure toleration of the Nerlynx  Labs from 08/31/2017 assessed, OK for treatment.  Current medication list in Epic reviewed, no DDIs with Nerlynx identified.  Prescription will be e-scribed [facility] for benefits analysis and approval as Nerlynx is a limited distribution medication. Prescriptions for medications for anti-diarrheal prophylaxis will be sent to appropriate pharmacy once I receive clarification if they can be dispensed from [facility].  Oral Oncology Clinic will continue to follow.  Johny Drilling, PharmD, BCPS, BCOP  08/31/2017 3:50 PM Oral Oncology Clinic [phone]

## 2017-08-31 NOTE — Patient Instructions (Signed)

## 2017-08-31 NOTE — Telephone Encounter (Signed)
-----   Message from Truitt Merle, MD sent at 08/31/2017  4:09 PM EDT ----- Regarding: RE: premeds for herceptin Yes, she can   ----- Message ----- From: Ardeen Garland, RN Sent: 08/31/2017   1:25 PM EDT To: Jesse Fall, RN, Truitt Merle, MD Subject: premeds for herceptin                          Can she take her tylenol 650 mg and claritin 10 mg at home the day of herceptin

## 2017-08-31 NOTE — Telephone Encounter (Signed)
Gave patient avs report and appointments for September. Per 8/20 los move 9/9 earlier if possible. No earlier coordination available 9/9 for lab/port/fu/tx. 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

## 2017-08-31 NOTE — Telephone Encounter (Signed)
email sent to pt

## 2017-09-01 ENCOUNTER — Encounter: Payer: Self-pay | Admitting: Hematology

## 2017-09-01 MED ORDER — LOPERAMIDE HCL 2 MG PO CAPS: 4.0000 mg | ORAL_CAPSULE | Freq: Three times a day (TID) | ORAL | 2 refills | Status: DC

## 2017-09-01 MED ORDER — BUDESONIDE ER 9 MG PO TB24: 9.0000 mg | ORAL_TABLET | Freq: Every day | ORAL | 2 refills | Status: DC

## 2017-09-01 MED ORDER — NERATINIB MALEATE 40 MG PO TABS: ORAL_TABLET | ORAL | 0 refills | Status: DC

## 2017-09-01 NOTE — Telephone Encounter (Signed)
Oral Oncology Pharmacist Encounter  [facility]oke with [facility] to ensure that they would be able to provide antidiarrheal prophylactic medications along with Nerlynx prescription. Pharmacy manager, Audelia Acton, stated that they would in fact fill those prescriptions if they went along with Nerlynx.  Called patient today to follow-up about Nerlynx acquisition. Patient informed that Nerlynx, Imodium, and budesonide prescriptions have all been sent to [facility] in [place], [place].  Patient informed that dispensing pharmacy will be reaching out to her to schedule delivery of her first shipment. I also provided dispensing pharmacy phone number to patient in case she needs to contact them for any reason.  We again discussed Nerlynx administration and dose titration schedule.  Week 1:Patient will take Nerlynx 40mg  tablets,3tablets (120mg ) by mouth once daily with food Week 2:Patient will take Nerlynx 40mg  tablets, 4 tablets (160mg ) by mouth once daily with food Week 3:Patient will take Nerlynx 40mg  tablets, 5 tablets (200mg ) by mouth once daily with food Week 4:Patient will take Nerlynx 40mg  tablets, 6 tablets (240mg ) by mouth once daily with food. 240 mg once daily is the target dose ofNerlynx.  Patient instructed that she will not increase her daily dose of Nerlynx unless she is tolerating current dose very well.  We discussed Imodium and budesonide for antidiarrheal prophylaxis. Patient instructed to titrate her antidiarrheal medications in order to achieve 1-2 bowel movements per day.  We did not discuss addition of Xeloda in 1 month after toleration of Nerlynx is assured. Patient with multiple questions about prognosis and expressed dissatisfaction with this pharmacist's inability to discuss expected length of life. Mrs. [name] instructed to bring family member with her to next office visit with Dr. Burr Medico so that her and her family can more fully understand her  prognosis.  Patient knows to call the office with any additional questions or concerns.  [name], PharmD, BCPS, BCOP  09/01/2017 3:04 PM Oral Oncology Clinic [phone]

## 2017-09-02 ENCOUNTER — Other Ambulatory Visit: Payer: Self-pay | Admitting: Pharmacist

## 2017-09-02 DIAGNOSIS — C7951 Secondary malignant neoplasm of bone: Principal | ICD-10-CM

## 2017-09-02 DIAGNOSIS — C50911 Malignant neoplasm of unspecified site of right female breast: Secondary | ICD-10-CM

## 2017-09-02 MED ORDER — LOPERAMIDE HCL 2 MG PO CAPS: 4.0000 mg | ORAL_CAPSULE | Freq: Three times a day (TID) | ORAL | 2 refills | Status: DC

## 2017-09-02 MED ORDER — BUDESONIDE ER 9 MG PO TB24: 9.0000 mg | ORAL_TABLET | Freq: Every day | ORAL | 2 refills | Status: DC

## 2017-09-03 ENCOUNTER — Other Ambulatory Visit: Payer: Self-pay | Admitting: Hematology

## 2017-09-03 MED ORDER — DIPHENOXYLATE-ATROPINE 2.5-0.025 MG PO TABS: 1.0000 | ORAL_TABLET | Freq: Four times a day (QID) | ORAL | 0 refills | Status: DC | PRN

## 2017-09-03 NOTE — Telephone Encounter (Signed)
I will call in budesonide and Lomotil, please let her know how to use them. Thanks   Truitt Merle MD

## 2017-09-09 ENCOUNTER — Telehealth: Payer: Self-pay | Admitting: *Deleted

## 2017-09-09 NOTE — Telephone Encounter (Signed)
Spoke with pt through interpreter, and was informed that pt has had severe constipation - cramping and pain.  Stated last " good " BMs was last week.  Has been taking Imodium 2 tabs  TID and Budesonide daily as instructed.  Instructed pt to HOLD Imodium and Budesonide TODAY;  Increased water intake - pt stated she did not like water, drinks lots of soda and juice.  Reinforced importance of water intake.  Pt voiced understanding.  Instructed pt to call office back early AM tomorrow to give nurse update of constipation problem.  Dr. Burr Medico notified. Pt's    Phone     [phone].

## 2017-09-09 NOTE — Telephone Encounter (Signed)
Jess,   Could you follow up on her? I would continue Budesonide daily along with Nerlynx but use Imodium as needed, or 1-2 tab daily and increase as needed, not 6 tabs a lay as prophylaxis. What do you think? Thanks much!   Truitt Merle MD

## 2017-09-10 NOTE — Telephone Encounter (Signed)
Unable to contact her deaf interpreter, sent patient a my chart message to check per Dr. Burr Medico (see note below).

## 2017-09-14 ENCOUNTER — Ambulatory Visit: Payer: Self-pay | Admitting: Hematology

## 2017-09-14 ENCOUNTER — Other Ambulatory Visit: Payer: Self-pay

## 2017-09-14 ENCOUNTER — Telehealth: Payer: Self-pay | Admitting: Hematology

## 2017-09-14 ENCOUNTER — Ambulatory Visit: Payer: Self-pay

## 2017-09-14 ENCOUNTER — Telehealth: Payer: Self-pay

## 2017-09-14 ENCOUNTER — Telehealth: Payer: Self-pay | Admitting: *Deleted

## 2017-09-14 NOTE — Telephone Encounter (Signed)
Please schedule her to see me or Lacie late this week, it sounds she has difficulty to manage the side effects.   Truitt Merle MD

## 2017-09-14 NOTE — Telephone Encounter (Signed)
Called regarding 9/5 °

## 2017-09-14 NOTE — Telephone Encounter (Signed)
Attempted to call patient will not go through.

## 2017-09-14 NOTE — Telephone Encounter (Signed)
Called patient back through deaf interpretive services, she was taking the Imodium incorrectly, instructed to take two tablets at the onset of diarrhea then one tablet every episode, instructed to increase her fluid intake, offered for patient to be seen in [facility] patient declined at this point.  Instructed her to call back if symptoms worsen.

## 2017-09-14 NOTE — Progress Notes (Signed)
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 She had mastectomy with the 7 axillary node evaluation, 4 cycles of adriamycin/ cytoxan followed by taxotere, then five years of tamoxifen thru June 2005 (no herceptin in 1999). She had bilateral oophorectomy in May 2005. She was briefly on aromatase inhibitor after tamoxifen, but discontinued this herself due to poor tolerance.    04/04/2015 Genetic Testing    Negative genetic testing on the breast/ovarian cancer pnael.  The Breast/Ovarian gene panel offered by [facility] includes sequencing and rearrangement analysis for the following 20 genes:  ATM, BARD1, BRCA1, BRCA2, BRIP1, CDH1, CHEK2, EPCAM, FANCC, MLH1, MSH2, MSH6, NBN, PALB2, PMS2, PTEN, RAD51C, RAD51D, TP53, and XRCC2.    06/26/2015 Pathology Results    Initial Path Report Bone, curettage, Left femur met, intramedullary subtroch tissue - METASTATIC ADENOCARCINOMA.  Estrogen Receptor: 95%, POSITIVE, STRONG STAINING INTENSITY Progesterone Receptor: 2%, POSITIVE, STRONG  HER2 (+), IHC 3+    06/26/2015 Surgery    Biopsy of metastatic tissue and stabilization with Affixus trochanteric nail, proximal and distal interlock of left femur by Dr. [name]     06/28/2015 Imaging    CT Chest w/ Contrast 1. Extensive right internal mammary chain lymphadenopathy consistent with metastatic breast cancer. 2. Lytic metastases involving the posterior elements at T1 with probable nondisplaced pathologic fracture of the spinous  process. 3. No other evidence of thoracic metastatic disease. 4. Trace bilateral pleural effusions with associated bibasilar atelectasis. 5. Indeterminate right thyroid nodule, not previously imaged. This could be evaluated with thyroid ultrasound as clinically warranted.    07/01/2015 Imaging    Bone Scan 1. Increased activity noted throughout the left femur. Although metastatic disease cannot be excluded, these changes are most likely secondary to prior surgery. 2. No other focal abnormalities identified to suggest metastatic disease.    07/12/2015 - 07/26/2015 Radiation Therapy    Left femur, 30 Gy in 10 fractions by Dr. Sondra Come     07/14/2015 Initial Diagnosis    [name], right ([place])    07/19/2015 Imaging    Initial PET Scan 1. Severe multifocal osseous metastatic disease, much greater than anticipated based on prior imaging. 2. Multifocal nodal metastases involving the right internal mammary, prevascular and subpectoral lymph nodes. No axillary pulmonary involvement identified. 3. No distant extra osseous metastases.    07/30/2015 - 11/29/2015 Chemotherapy    Docetaxel, Herceptin and Perjeta every 3 weeks X6 cycles, pt tolerated moderately well, restaging scan showed excellent response     09/18/2015 Imaging    CT Head w/wo Contrast 1. No acute intracranial abnormality or significant interval change. 2. Stable minimal periventricular white matter hypoattenuation on the right. This may reflect a remote ischemic injury. 3. No evidence for metastatic disease to the brain. 4. No focal soft tissue lesion to explain the patient's tenderness.    10/30/2015 Imaging    CT Abdomen Pelvis w/ Contrast 1. Few mildly prominent fluid-filled loops of small bowel scattered throughout the abdomen, with intraluminal fluid density within  the distal colon. Given the provided history, findings are suggestive of acute enteritis/diarrheal illness. 2. No other acute  intra-abdominal or pelvic process identified. 3. Widespread osseous metastatic disease, better evaluated on most recent PET-CT from 07/19/2015. No pathologic fracture or other complication. 4. 11 mm hypodensity within the left kidney. This lesion measures intermediate density, and is indeterminate. While this lesion is similar in size relative to recent studies, this is increased in size relative to prior study from 2012. Further evaluation with dedicated renal mass protocol CT and/or MRI is recommended for complete characterization.    12/25/2015 Imaging    Restaging PET Scan 1. Markedly improved skeletal activity, with only several faint foci of residual accentuated metabolic activity, but resolution of the vast majority of the previously extensive osseous metastatic disease. 2. Resolution of the prior right internal mammary and right prevascular lymph nodes. 3. Residual subcutaneous edema and edema along fascia planes in the left upper thigh. This remains somewhat more than I would expect for placement of an IM nail 6 weeks ago. If there is leg swelling further down, consider Doppler venous ultrasound to rule out left lower extremity DVT. I do not perceive an obvious difference in density between the pelvic and common femoral veins on the noncontrast CT data. 4. Chronic right maxillary and right sphenoid sinusitis.    12/2015 - 05/14/2016 Chemotherapy    Maintenance Herceptin and pejeta every 3 weeks stopped on 05/14/16 due to disease progression    01/16/2016 -  Anti-estrogen oral therapy    Letrozole 2.5 mg daily. She was switched to exemestane due to disease progression on 02/08/17.    03/01/2016 Miscellaneous    Patient presented to ED following a fall; she reports she landed on her back and is now experiencing back pain. The patient was evaluated and discharge home same day with pain control.    05/01/2016 Imaging    CT CAP IMPRESSION: 1. Stable exam.  No new or progressive disease  identified. 2. No evidence for residual or recurrent adenopathy within the chest. 3. Stable bone metastasis.    05/01/2016 Imaging    BONE SCAN IMPRESSION: 1. Small focal uptake involving the anterior rib ends of the left second and right fourth ribs, new since the prior bone scan. The location of this uptake is more suggestive of a traumatic or inflammatory etiology as opposed to metastatic disease. No other evidence of metastatic disease. 2. There are other areas of stable uptake which are most likely degenerative/reactive, including right shoulder and cervical spine uptake and uptake along the right femur adjacent to the ORIF hardware.    05/20/2016 Imaging    NM PET Skull to Thigh IMPRESSION: 1. There multiple metastatic foci in the bony pelvis, including some new abnormal foci compare to the prior PET-CT, compatible with mildly progressive bony metastatic disease. Also, a left T11 vertebral hypermetabolic metastatic lesion is observed, new compared to the prior exam. 2. No active extraosseous malignancy is currently identified. 3. Right anterior fourth rib healing fracture, appears be    06/04/2016 - 01/08/2017 Chemotherapy    Second line chemotherapy Kadcyla every 3 weeks started on 06/04/16 and stopped on 01/08/17 due to mixed response.      10/05/2016 Imaging    CT CAP and Bone Scan:  Bones: left intramedullary rod and left femoral head neck screw in place. In the proximal diaphysis of the left femur there is cortical thickening and irregularity. No lytic or blastic bone lesions. No fracture or vertebral endplate destruction.  No definite evidence of recurrent disease in the chest, abdomen, or pelvis.    10/28/2016 Imaging    CT A/P IMPRESSION: No acute process demonstrated in the abdomen or pelvis. No evidence of bowel obstruction or inflammation.    11/04/2016 Imaging    DG foot complete left: IMPRESSION: No fracture or dislocation.  No soft tissue abnormality     11/23/2016 Imaging    CT RIGHT FEMUR IMPRESSION: Negative CT scan of the right thigh.  No visible metastatic disease.  CT LEFT FEMUR IMPRESSION: 1. Postsurgical changes related to prior cephalomedullary rod fixation of the left femur with linear lucency along the anterolateral proximal femoral cortex at level of the lesser trochanter, suspicious for nondisplaced fracture. 2. Slightly permeative appearance of the proximal left femoral cortex at the site of known prior osseous metastases is largely unchanged. 3. Unchanged patchy lucency and sclerosis along the anterior acetabulum, which appears to correspond with an area of faint uptake on the prior PET-CT, suspicious for osseous metastasis. These results will be called to the ordering clinician or representative by the Radiologist Assistant, and communication documented in the PACS or zVision Dashboard.      01/27/2017 Imaging    IMPRESSION: 1. Mixed response to osseous metastasis. Some hypermetabolic lesions are new and increasingly hypermetabolic, while another index lesion is decreasingly hypermetabolic. 2. No evidence of hypermetabolic soft tissue metastasis.     02/08/2017 -  Antibody Plan    -Exemestane 25 mg daily since 02/08/17 -Herceptin every 3 week since 02/08/17 -Oral Lanpantinib 1551m and decreased to 5039mdue to diarrhea and depression. Due to disease progression she swithced to Verzenio 15038mn 05/04/17. She did not toelrate well so she was switched to Ibrance 100m40m 05/31/17. Due to her neutropenia, Ibrance dose reduced to 75 mg daily, 3 weeks on, one-week off.     04/30/2017 PET scan    IMPRESSION: 1. Primarily increase in hypermetabolism of osseous metastasis. An isolated right acetabular lesion is decreased in hypermetabolism since the prior exam. 2. No evidence of hypermetabolic soft tissue metastasis. 3. Similar non FDG avid right-sided thyroid nodule, favoring a benign etiology. Recommend attention on  follow-up.    08/30/2017 PET scan    08/30/2017 PET Scan  IMPRESSION: 1. Worsening osseous metastatic disease. 2. Focal hypermetabolism in the central spinal canal the L1 level, similar to the prior exam. Metastatic implant cannot be excluded. 3. Slight enlargement of a right thyroid nodule which now appears more cystic in character. Consider further evaluation with thyroid ultrasound. If patient is clinically hyperthyroid, consider nuclear medicine thyroid uptake and scan    08/30/2017 Progression    08/30/2017 PET Scan  IMPRESSION: 1. Worsening osseous metastatic disease. 2. Focal hypermetabolism in the central spinal canal the L1 level, similar to the prior exam. Metastatic implant cannot be excluded. 3. Slight enlargement of a right thyroid nodule which now appears more cystic in character. Consider further evaluation with thyroid ultrasound. If patient is clinically hyperthyroid, consider nuclear medicine thyroid uptake and scan     HISTORY OF PRESENTING ILLNESS (From Dr. LiveMariana Kaufmane on 02/06/2016):  WendFelisa Boniery77. 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 She has had an excellent response to 6 cycles of taxotere herceptin perjeta from 07-30-15 thru 11-29-15. She is continuing herceptin and perjeta every 3 weeks. She had extreme local pain with first xgeva injection on 01-16-16 and prefers resuming previous zometa. She began  letrozole ~ 01-16-16, has no complaints about this at all.  Last imaging was PET 12-25-15.  NOTE CTs, plain Xrays and bone scan did NOT show any of the extensively metastatic bone involvement 06-2015; MRI and PET did image disease clearly.  Patient continues to do very well overall. MRSA skin lesions on scalp and face improved quickly with doxycycline starting 01-16-16. She continues to use Dial soap and we have again strongly encouraged good hygiene with  other family members at home. She has had the 3 lymphedema PT sessions allowed by insurance, continues to do massage for LLE herself, still has not gotten the ordered compression apparatus for home and will follow up with PT on that. Swelling in LLE has improved with the interventions and does not seem as uncomfortable. She denies any pain. Appetite is excellent, bowels fine, no SOB, good energy, no problems with PAC, no noted changes in left breast. No bleeding. No fever or symptoms of infection. She slipped in tub last week, fell onto right arm fortunately without injury. She was upset by a visitor and shaky then, but no tremors otherwise.   She has two children and 4 grandchildren. She has had genetic testing and it was negative. She lives at home with her husband. She is very active. Her children live close to her.   CURRENT THERAPY:    -Exemestane '25mg'$  daily starting 02/08/17 -Herceptin every 3 weeks starting 02/08/17 -Neratinib started on '120mg'$  daily on 09/02/2017  INTERIM HISTORY:   [name] 52 y.o. female returns today for follow up. She is here with her interpreter. She is here with her husband and interpreter. She is complaining of diarrhea of 2 days duration. She has to wear a diaper as she is becoming incontinent. She initially had constipation. She is a little upset about a phone call from the cancer physician who told her "she was doing to die", but she doesn't remember who the physician is. She is anxious about her prognosis and wants to know how much time she has left.    MEDICAL HISTORY:  Past Medical History:  Diagnosis Date  . Abnormal Pap smear   . Anxiety   . Breast cancer ([place])   . Cancer ([place]) 1999   breast-s/p mastectomy, chemo, rad  . CIN I (cervical intraepithelial neoplasia I) 2003   by colpo  . Congenital deafness   . Deaf   . Depression   . Radiation 07/12/15-07/26/15   left femur 30 Gy  . S/P bilateral oophorectomy     SURGICAL HISTORY: Past Surgical  History:  Procedure Laterality Date  . ABDOMINAL HYSTERECTOMY    . INTRAMEDULLARY (IM) NAIL INTERTROCHANTERIC Left 06/26/2015   Procedure: LEFT BIOMET LONG AFFIXS NAIL;  Surgeon: [name], MD;  Location: [facility];  Service: Orthopedics;  Laterality: Left;  . IR GENERIC HISTORICAL  08/06/2015   IR US GUIDE VASC ACCESS LEFT 08/06/2015 Aletta Edouard, MD [facility]  . IR GENERIC HISTORICAL  08/06/2015   IR FLUORO GUIDE CV LINE LEFT 08/06/2015 Aletta Edouard, MD [facility]  . IR GENERIC HISTORICAL  03/05/2016   IR CV LINE INJECTION 03/05/2016 Markus Daft, MD [facility]  . MASTECTOMY     right breast  . MASTECTOMY    . OVARIAN CYST REMOVAL    . RADIOLOGY WITH ANESTHESIA Left 06/25/2015   Procedure: MRI OF LEFT HIP WITH OR WITHOUT CONTRAST;  Surgeon: Medication Radiologist, MD;  Location: [facility];  Service: Radiology;  Laterality: Left;  DR. MCINTYRE/MRI  .  TUBAL LIGATION      SOCIAL HISTORY: Social History   Socioeconomic History  . Marital status: Married    Spouse name: Not on file  . Number of children: 2  . Years of education: Not on file  . Highest education level: Not on file  Occupational History  . Not on file  Social Needs  . Financial resource strain: Not on file  . Food insecurity:    Worry: Not on file    Inability: Not on file  . Transportation needs:    Medical: Not on file    Non-medical: Not on file  Tobacco Use  . Smoking status: Never Smoker  . Smokeless tobacco: Never Used  Substance and Sexual Activity  . Alcohol use: No  . Drug use: No  . Sexual activity: Yes    Birth control/protection: Surgical  Lifestyle  . Physical activity:    Days per week: Not on file    Minutes per session: Not on file  . Stress: Not on file  Relationships  . Social connections:    Talks on phone: Not on file    Gets together: Not on file    Attends religious service: Not on file    Active member of club or organization: Not on file    Attends meetings of clubs or  organizations: Not on file    Relationship status: Not on file  . Intimate partner violence:    Fear of current or ex partner: Not on file    Emotionally abused: Not on file    Physically abused: Not on file    Forced sexual activity: Not on file  Other Topics Concern  . Not on file  Social History Narrative  . Not on file    FAMILY HISTORY: Family History  Problem Relation Age of Onset  . Hypertension Mother   . Seizures Mother   . Alzheimer's disease Maternal Uncle   . Pancreatitis Maternal Grandmother   . Heart attack Maternal Grandfather     ALLERGIES:  is allergic to chlorhexidine; promethazine hcl; and diphenhydramine hcl.  MEDICATIONS:  Current Outpatient Medications  Medication Sig Dispense Refill  . Budesonide ER 9 MG TB24 Take 9 mg by mouth daily. Take with food early in the day. 30 tablet 2  . Cholecalciferol 2000 units CAPS Take 1 capsule by mouth daily.    . diphenoxylate-atropine (LOMOTIL) 2.5-0.025 MG tablet Take 1-2 tablets by mouth 4 (four) times daily as needed for diarrhea or loose stools. 30 tablet 0  . divalproex (DEPAKOTE) 250 MG DR tablet Take 1 tablet (250 mg total) by mouth at bedtime. 30 tablet 2  . exemestane (AROMASIN) 25 MG tablet Take 1 tablet (25 mg total) by mouth daily after breakfast. 30 tablet 0  . HYDROcodone-acetaminophen (NORCO) 10-325 MG tablet Take 1 tablet by mouth every 6 (six) hours as needed. 45 tablet 0  . lidocaine-prilocaine (EMLA) cream Apply 1 application topically as needed. Apply to Porta-Cath 1-2 hours prior to access as directed. 30 g 2  . loperamide (IMODIUM) 2 MG capsule Take 2 capsules (4 mg total) by mouth 3 (three) times daily. May take additional tablets daily to titrate to 1-2 bowel movements each day 200 capsule 2  . Neratinib Maleate (NERLYNX) 40 MG tablet Week1: 3 tabs ('120mg'$ ) once daily. MWUX3: 4 tabs ('160mg'$ ) daily. KGMW1: 5 tabs ('200mg'$ ) daily. UUVO5 &onward: 6 tabs ('240mg'$ ) daily. Take w food 120 tablet 0  .  potassium chloride (K-DUR) 10 MEQ tablet Take  10 mEq by mouth daily. Take 2 tablets daily.    . prochlorperazine (COMPAZINE) 10 MG tablet Take 1 tablet (10 mg total) by mouth every 6 (six) hours as needed for nausea or vomiting. 30 tablet 2   No current facility-administered medications for this visit.    Facility-Administered Medications Ordered in Other Visits  Medication Dose Route Frequency Provider Last Rate Last Dose  . sodium chloride 0.9 % 1,000 mL with potassium chloride 10 mEq infusion   Intravenous Continuous Gordy Levan, MD   Stopped at 09/10/15 1653  . sodium chloride 0.9 % injection 10 mL  10 mL Intravenous PRN Livesay, Lennis P, MD        REVIEW OF SYSTEMS:  Constitutional: Denies fevers, chills or abnormal night sweats , low appetite from stress , weight stable (+) scalp sensitivity  Eyes: Denies blurriness of vision, double vision or watery eyes Ears, nose, mouth, throat, and face: Denies mucositis or sore throat (+) deaf Respiratory: Denies cough, dyspnea or wheezes  Cardiovascular: Denies palpitation, chest discomfort or lower extremity swelling Skin: No rashes or discolorations  Gastrointestinal:  (+) mild nausea (+) severe diarrhea, wears diaper   Lymphatics: Denies new lymphadenopathy or easy bruising. MSK: (+) left Leg cramps and pain and right shoulder pain (5-8/10) Behavioral/Psych: (+) stress, depression and anxiety  All other systems were reviewed with the patient and are negative.  PHYSICAL EXAMINATION:  ECOG PERFORMANCE STATUS: 2 Vitals:   09/16/17 0848  BP: 120/86  Pulse: 88  Resp: 18  Temp: (!) 97.5 F (36.4 C)  TempSrc: Oral  SpO2: 100%  Weight: 122 lb 14.4 oz (55.7 kg)  Height: '5\' 7"'$  (1.702 m)    GENERAL:alert, no distress and comfortable  EYES: normal, conjunctiva are pink and non-injected, sclera clear OROPHARYNX:no exudate, no erythema and lips, buccal mucosa, and tongue normal  NECK: supple, thyroid normal size, non-tender, without  nodularity LYMPH:  no palpable lymphadenopathy in the cervical, axillary or inguinal LUNGS: clear to auscultation and percussion with normal breathing effort HEART: regular rate & rhythm and no murmurs and no lower extremity edema ABDOMEN:abdomen soft, non-tender and normal bowel sounds. No organomegaly. Musculoskeletal:no cyanosis of digits and no clubbing  PSYCH: alert & oriented x 3 with fluent speech NEURO: no focal motor/sensory deficits Breasts: Breast inspection showed Right breast is surgically absent status post reconstruction. Palpation of the breasts and axilla revealed no obvious mass that I could appreciate.   LABORATORY DATA:  I have reviewed the data as listed CBC Latest Ref Rng & Units 08/31/2017 08/23/2017 08/09/2017  WBC 3.9 - 10.3 K/uL 3.0(L) 2.8(L) 3.2(L)  Hemoglobin 11.6 - 15.9 g/dL 11.1(L) 11.1(L) 11.8  Hematocrit 34.8 - 46.6 % 31.6(L) 31.8(L) 33.8(L)  Platelets 145 - 400 K/uL 237 128(L) 200   CMP Latest Ref Rng & Units 08/31/2017 08/23/2017 08/09/2017  Glucose 70 - 99 mg/dL 86 91 77  BUN 6 - 20 mg/dL '15 11 13  '$ Creatinine 0.44 - 1.00 mg/dL 1.16(H) 0.94 1.00  Sodium 135 - 145 mmol/L 140 140 140  Potassium 3.5 - 5.1 mmol/L 3.8 3.4(L) 4.1  Chloride 98 - 111 mmol/L 104 105 106  CO2 22 - 32 mmol/L '30 24 27  '$ Calcium 8.9 - 10.3 mg/dL 8.8(L) 9.1 9.7  Total Protein 6.5 - 8.1 g/dL 6.8 6.9 7.5  Total Bilirubin 0.3 - 1.2 mg/dL 2.8(H) 1.9(H) 2.2(H)  Alkaline Phos 38 - 126 U/L 65 62 68  AST 15 - 41 U/L '17 15 15  '$ ALT 0 -  44 U/L '12 11 11    '$ TUMOR MARKER  CA 27.29 Results for TORY, MCKISSACK (MRN [id number]) as of 08/26/2017 16:56  Ref. Range 03/22/2017 10:06 05/03/2017 08:55 06/14/2017 08:06 07/19/2017 13:36 08/23/2017 08:12  CA 27.29 Latest Ref Range: 0.0 - 38.6 U/mL 36.5 29.3 35.6 49.2 (H) 62.0 (H)    PROCEDURES:   RADIOGRAPHIC STUDIES:  08/30/2017 PET Scan  IMPRESSION: 1. Worsening osseous metastatic disease. 2. Focal hypermetabolism in the central spinal canal the L1  level, similar to the prior exam. Metastatic implant cannot be excluded. 3. Slight enlargement of a right thyroid nodule which now appears more cystic in character. Consider further evaluation with thyroid ultrasound. If patient is clinically hyperthyroid, consider nuclear medicine thyroid uptake and scan.  PET Scan 04/30/17 IMPRESSION: 1. Primarily increase in hypermetabolism of osseous metastasis. An isolated right acetabular lesion is decreased in hypermetabolism since the prior exam. 2. No evidence of hypermetabolic soft tissue metastasis. 3. Similar non FDG avid right-sided thyroid nodule, favoring a benign etiology. Recommend attention on follow-up.  PET SCAN: 01/27/17 IMPRESSION: 1. Mixed response to osseous metastasis. Some hypermetabolic lesions are new and increasingly hypermetabolic, while another index lesion is decreasingly hypermetabolic. 2. No evidence of hypermetabolic soft tissue metastasis.  CT A/P IMPRESSION: 10/28/16 No acute process demonstrated in the abdomen or pelvis. No evidence of bowel obstruction or inflammation.  CT CAP and Bone Scan: 10/05/16  Bones: left intramedullary rod and left femoral head neck screw in place. In the proximal diaphysis of the left femur there is cortical thickening and irregularity. No lytic or blastic bone lesions. No fracture or vertebral endplate destruction.  No definite evidence of recurrent disease in the chest, abdomen, or pelvis.  PET 05/20/2016 IMPRESSION: 1. There multiple metastatic foci in the bony pelvis, including some new abnormal foci compare to the prior PET-CT, compatible with mildly progressive bony metastatic disease. Also, a left T11 vertebral hypermetabolic metastatic lesion is observed, new compared to the prior exam. 2. No active extraosseous malignancy is currently identified. 3. Right anterior fourth rib healing fracture, appears benign.  NM Bone Scan 05/01/2016 IMPRESSION: 1. Small focal uptake involving the  anterior rib ends of the left second and right fourth ribs, new since the prior bone scan. The location of this uptake is more suggestive of a traumatic or inflammatory etiology as opposed to metastatic disease. No other evidence of metastatic disease. 2. There are other areas of stable uptake which are most likely degenerative/reactive, including right shoulder and cervical spine uptake and uptake along the right femur adjacent to the ORIF Hardware.  CT CAP 05/01/2016 IMPRESSION: 1. Stable exam.  No new or progressive disease identified. 2. No evidence for residual or recurrent adenopathy within the chest. 3. Stable bone metastasis.  I have personally reviewed the radiological images as listed and agreed with the findings in the report.  ASSESSMENT & PLAN:  [name] is a 52 y.o. postmenopause female with:  1. 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 -Restaging PET 12-25-15 markedly improved of bone metastasis after initial systemic chemo  -she was switched to letrozole and maintenance Herceptin and the perjeta on 01/16/16 which stopped on 05/14/16.  -Did not tolerate SQ xgeva and it was changed to zometa in 06/2015 -Continue monitoring ECHO every 3 months when she is on herceptin -I previously reviewed her CT scan and bone scan from 05/01/2016, which  showed stable disease overall, a few new uptake of anterior ribs on bone scan are indeterminate. -I previously discussed her restaging PET scan from 05/20/2016. Unfortunately it showed mild disease progression of her bone metastasis  -I have changed her treatment to second line Kadcyla and letrozole on 06/04/16, she is tolerating very well. She declined other chemo.  -We previously received records from [facility] in [place], [place]. She underwent CT CAP w contrast on 10/02/16 and bone scan on  09/21/16. CT shows no evidence of recurrent disease in the chest, abdomen, or pelvis; Bone scan indicates no lytic or blastic bone lesions.  -She has been on Zometa every 4-6 weeks since June 2017. We previously changed to every 3 months in 12/2016. 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  No other visceral organ metastasis. She is clinically doing well.  -Kadcyla was switched to Exemestane, oral HER2 antibody treatment lapatinib and herceptin starting 02/08/17. -She had severe side effects of diarrhea, depression and low appetite with lapatinib on full dose of 1500 mg after 1 week and she stopped. I recommended her to restart with low dose, and she can only tolerate low dose 500 mg of lapatinib, which she has been on -Her PET Scan from 04/30/17 revealed a primarily increase in hypermetabolism of osseous metastasis. There are no new lesions.  I reviewed the images and compared to the previous images in person with patient and her husband. I suspect her progression is due to her not being able to fully tolerate lapatinib -I previously discussed options with clinical trial for the new Her2 antibody conjugate Trastuzumab deruxtecan, the phase 3 clinical trial has been open, and the closest site is in [place] and [employer]. She is not interested in going to [place] at this time. -She did not respond to CDK4/6 inhibitor -Her 08/30/2017 PET Scan showed disease progression in her bones, no other new visceral metastasis. I reviewed her PET images with her in person. She denies hip or pelvic pain and has stable right shoulder and left hip pain from before.  -Will stop Ibrance due to disease progression, and started her on Neratinib -Due to the prophylactic Imodium 6 tablets a day which  she started with neratinib, she developed constipation.  She subsequently stopped Imodium, but developed significant diarrhea, nausea and vomiting. -I again reviewed the diarrhea management with her.  She will restart neratinib at 3 capsules daily, along with budesonide, she will use Imodium 2 capsule in the morning, and as needed during the day for up to 8 episodes a day, and to use Lomotil 1 to 2 tablets every 6 hours as needed for diarrhea control. -To take a nutritional supplement, she will try Carnation breakfast -We will see her back next week, to see if we can increased neratinib dose if she tolerates well  2. History of MRSA skin infection scalp, boil at low back scalp and upper lip -I prescribed doxycycline on 04/24/16 for 7 days  -Resolved now.  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  -Husband also deaf.  4. premature surgical menopause -post BSO 2064fr pelvic pain and ovarian cysts, likely was also beneficial from standpoint of the breast cancer. -hysterectomy   5. Intermittent anemia  -mild, possible related to prior chemo -She is asymptomatic,  we'll continue close monitoring  6. Hypokalemia  -She has had intermittent hypokalemia -Continue on potassium 20 mEq daily.  -K increased to 3.8 on 08/31/2017  7. Goal of care discussion  -We previously discussed the incurable nature of her cancer, and the overall poor prognosis, especially if she does not have good response to chemotherapy or progress on chemo.  -The patient understands the goal of care is palliative. -she is full code for now   8. Bilateral Leg Pain -She initially presented with worsening left leg and hip pain secondary to bone mets, she had surgery before, this was much improved after her prior chemo -The patient previously took Melrose for this pain, 1 tablet every 6 hours as needed for pain. Which helped. -I previously encouraged her to follow up with Dr. [name] about the pain  in her left leg. 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 She will continue calcium and Norco as needed. I suggest she try tylenol for pain during the day. She is willing to try.   9. Insomnia, Depression, Stress  -I previously gave her Ambien '5mg'$ , but it did not work well.  -continue ativan 1 mg to take at night as needed  -mirtazapine did not work, she also tried trazodone from PCP which gave her a rash. She stopped taking it.  -After the death of her dog she has been stressed and sad from how it happened.  -I previously offered her the chance to speak with our counselor or a therapist, she declined at this time. She denied inclinations of self harm.  -I also previously offered her the choice of antidepressants. She will try coping without medication for now. I previously encouraged her to speak with family or friends about her feelings.   10.  Hyperbilirubinemia  -She has developed mild hyperbilirubinemia recently, TBIL 1.2-2.7, okay to continue treatment  -increased to 2.8 on 08/31/2017  11. Mild dyspnea  -Her 08/04/17 ECHO shows mostly adequate heart function -If her breathing worsens I encouraged her to contact the clinic  - will continue monitoring   12. Diarrhea -She complains of severe diarrhea that made her wear a diaper.  This is secondary to neratinib -I advised her to take imodium 2 capsules in the morning, and as needed up to 8 tablets a day, use Lomotil as needed -I also advised her to drink fluids and maintain a healthy diet.   Plan:  -Continue Exemestane daily.  -I refilled Norco today  -She will continue Neratinib, along with budesonide, Imodium and Lomotil -I will see her back in next week.   Pt had many questions. All questions were answered. The patient knows to call the clinic with any problems, questions or concerns.  I spent 20 minutes  counseling the patient face to face. The total time spent in the appointment was 25 minutes and more than 50% was on counseling.  Dierdre Searles Dweik am acting as scribe for Dr. Truitt Merle.  I have reviewed the above documentation for accuracy and completeness, and I agree with the above.     Truitt Merle, MD 09/16/2017

## 2017-09-14 NOTE — Telephone Encounter (Signed)
Schedule message was sent

## 2017-09-14 NOTE — Telephone Encounter (Signed)
Interpreted call.   "Need to talk, I'm having issues with the medicine.  Concerned because I feel bad.  It's been two weeks and I'm tired.  I have a rash.  Had a BM then two days later, diarrhea nine times.  I had to buy diapers because the anti-diarrhea medicine is not working.  Vomited three times last night.  My husband and family say I'm loosing weight.  I can eat a little and drink one bottled water daily with orange and grape juice throughout the day.  I don't want to take the medicine this afternoon so please call me before 2:00 pm to let me know what to do next.  Call [phone] and it will be an interpreted call."

## 2017-09-15 NOTE — Telephone Encounter (Signed)
Oral Oncology Patient Advocate Encounter  I confirmed with [facility] that the Nerlynx was delivered to the patient on 8/26.  [name] called the [place] back at [facility] to confirm she had received the medicine and was concerned about taking so much medicine and worried it would kill her. [place] went over this with her and she seemed better at the end of their conversation.  [facility] Phone [phone] Fax [phone]

## 2017-09-15 NOTE — Telephone Encounter (Signed)
I am seeing her tomorrow, thanks   Truitt Merle MD

## 2017-09-16 ENCOUNTER — Encounter: Payer: Self-pay | Admitting: Hematology

## 2017-09-16 ENCOUNTER — Inpatient Hospital Stay: Payer: Medicaid Other | Attending: Hematology | Admitting: Hematology

## 2017-09-16 ENCOUNTER — Telehealth: Payer: Self-pay | Admitting: Hematology

## 2017-09-16 VITALS — BP 120/86 | HR 88 | Temp 97.5°F | Resp 18 | Ht 67.0 in | Wt 122.9 lb

## 2017-09-16 DIAGNOSIS — F419 Anxiety disorder, unspecified: Secondary | ICD-10-CM | POA: Diagnosis not present

## 2017-09-16 DIAGNOSIS — Z79811 Long term (current) use of aromatase inhibitors: Secondary | ICD-10-CM | POA: Insufficient documentation

## 2017-09-16 DIAGNOSIS — Z79899 Other long term (current) drug therapy: Secondary | ICD-10-CM

## 2017-09-16 DIAGNOSIS — Z8614 Personal history of Methicillin resistant Staphylococcus aureus infection: Secondary | ICD-10-CM | POA: Diagnosis not present

## 2017-09-16 DIAGNOSIS — Z170 Estrogen receptor positive status [ER+]: Secondary | ICD-10-CM | POA: Diagnosis not present

## 2017-09-16 DIAGNOSIS — C50911 Malignant neoplasm of unspecified site of right female breast: Secondary | ICD-10-CM | POA: Diagnosis not present

## 2017-09-16 DIAGNOSIS — D649 Anemia, unspecified: Secondary | ICD-10-CM | POA: Insufficient documentation

## 2017-09-16 DIAGNOSIS — H905 Unspecified sensorineural hearing loss: Secondary | ICD-10-CM | POA: Insufficient documentation

## 2017-09-16 DIAGNOSIS — D61818 Other pancytopenia: Secondary | ICD-10-CM | POA: Diagnosis not present

## 2017-09-16 DIAGNOSIS — E876 Hypokalemia: Secondary | ICD-10-CM | POA: Insufficient documentation

## 2017-09-16 DIAGNOSIS — Z5112 Encounter for antineoplastic immunotherapy: Secondary | ICD-10-CM | POA: Diagnosis not present

## 2017-09-16 DIAGNOSIS — G4700 Insomnia, unspecified: Secondary | ICD-10-CM | POA: Diagnosis not present

## 2017-09-16 DIAGNOSIS — C7951 Secondary malignant neoplasm of bone: Secondary | ICD-10-CM | POA: Insufficient documentation

## 2017-09-16 DIAGNOSIS — F329 Major depressive disorder, single episode, unspecified: Secondary | ICD-10-CM | POA: Insufficient documentation

## 2017-09-16 DIAGNOSIS — K5900 Constipation, unspecified: Secondary | ICD-10-CM | POA: Diagnosis not present

## 2017-09-16 MED ORDER — HYDROCODONE-ACETAMINOPHEN 10-325 MG PO TABS: 1.0000 | ORAL_TABLET | Freq: Four times a day (QID) | ORAL | 0 refills | Status: DC | PRN

## 2017-09-16 NOTE — Telephone Encounter (Signed)
No los 9/5 °

## 2017-09-17 ENCOUNTER — Ambulatory Visit: Payer: Medicaid Other | Admitting: Hematology

## 2017-09-20 ENCOUNTER — Ambulatory Visit: Payer: Self-pay | Admitting: Hematology

## 2017-09-20 ENCOUNTER — Ambulatory Visit: Payer: Self-pay

## 2017-09-20 ENCOUNTER — Other Ambulatory Visit: Payer: Self-pay

## 2017-09-20 NOTE — Progress Notes (Signed)
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 She had mastectomy with the 7 axillary node evaluation, 4 cycles of adriamycin/ cytoxan followed by taxotere, then five years of tamoxifen thru June 2005 (no herceptin in 1999). She had bilateral oophorectomy in May 2005. She was briefly on aromatase inhibitor after tamoxifen, but discontinued this herself due to poor tolerance.    04/04/2015 Genetic Testing    Negative genetic testing on the breast/ovarian cancer pnael.  The Breast/Ovarian gene panel offered by [facility] includes sequencing and rearrangement analysis for the following 20 genes:  ATM, BARD1, BRCA1, BRCA2, BRIP1, CDH1, CHEK2, EPCAM, FANCC, MLH1, MSH2, MSH6, NBN, PALB2, PMS2, PTEN, RAD51C, RAD51D, TP53, and XRCC2.    06/26/2015 Pathology Results    Initial Path Report Bone, curettage, Left femur met, intramedullary subtroch tissue - METASTATIC ADENOCARCINOMA.  Estrogen Receptor: 95%, POSITIVE, STRONG STAINING INTENSITY Progesterone Receptor: 2%, POSITIVE, STRONG  HER2 (+), IHC 3+    06/26/2015 Surgery    Biopsy of metastatic tissue and stabilization with Affixus trochanteric nail, proximal and distal interlock of left femur by Dr. [name]     06/28/2015 Imaging    CT Chest w/ Contrast 1. Extensive right internal mammary chain lymphadenopathy consistent with metastatic breast cancer. 2. Lytic metastases involving the posterior elements at T1 with probable nondisplaced pathologic fracture of the spinous  process. 3. No other evidence of thoracic metastatic disease. 4. Trace bilateral pleural effusions with associated bibasilar atelectasis. 5. Indeterminate right thyroid nodule, not previously imaged. This could be evaluated with thyroid ultrasound as clinically warranted.    07/01/2015 Imaging    Bone Scan 1. Increased activity noted throughout the left femur. Although metastatic disease cannot be excluded, these changes are most likely secondary to prior surgery. 2. No other focal abnormalities identified to suggest metastatic disease.    07/12/2015 - 07/26/2015 Radiation Therapy    Left femur, 30 Gy in 10 fractions by Dr. Sondra Come     07/14/2015 Initial Diagnosis    [name], right ([place])    07/19/2015 Imaging    Initial PET Scan 1. Severe multifocal osseous metastatic disease, much greater than anticipated based on prior imaging. 2. Multifocal nodal metastases involving the right internal mammary, prevascular and subpectoral lymph nodes. No axillary pulmonary involvement identified. 3. No distant extra osseous metastases.    07/30/2015 - 11/29/2015 Chemotherapy    Docetaxel, Herceptin and Perjeta every 3 weeks X6 cycles, pt tolerated moderately well, restaging scan showed excellent response     09/18/2015 Imaging    CT Head w/wo Contrast 1. No acute intracranial abnormality or significant interval change. 2. Stable minimal periventricular white matter hypoattenuation on the right. This may reflect a remote ischemic injury. 3. No evidence for metastatic disease to the brain. 4. No focal soft tissue lesion to explain the patient's tenderness.    10/30/2015 Imaging    CT Abdomen Pelvis w/ Contrast 1. Few mildly prominent fluid-filled loops of small bowel scattered throughout the abdomen, with intraluminal fluid density within  the distal colon. Given the provided history, findings are suggestive of acute enteritis/diarrheal illness. 2. No other acute  intra-abdominal or pelvic process identified. 3. Widespread osseous metastatic disease, better evaluated on most recent PET-CT from 07/19/2015. No pathologic fracture or other complication. 4. 11 mm hypodensity within the left kidney. This lesion measures intermediate density, and is indeterminate. While this lesion is similar in size relative to recent studies, this is increased in size relative to prior study from 2012. Further evaluation with dedicated renal mass protocol CT and/or MRI is recommended for complete characterization.    12/25/2015 Imaging    Restaging PET Scan 1. Markedly improved skeletal activity, with only several faint foci of residual accentuated metabolic activity, but resolution of the vast majority of the previously extensive osseous metastatic disease. 2. Resolution of the prior right internal mammary and right prevascular lymph nodes. 3. Residual subcutaneous edema and edema along fascia planes in the left upper thigh. This remains somewhat more than I would expect for placement of an IM nail 6 weeks ago. If there is leg swelling further down, consider Doppler venous ultrasound to rule out left lower extremity DVT. I do not perceive an obvious difference in density between the pelvic and common femoral veins on the noncontrast CT data. 4. Chronic right maxillary and right sphenoid sinusitis.    12/2015 - 05/14/2016 Chemotherapy    Maintenance Herceptin and pejeta every 3 weeks stopped on 05/14/16 due to disease progression    01/16/2016 -  Anti-estrogen oral therapy    Letrozole 2.5 mg daily. She was switched to exemestane due to disease progression on 02/08/17.    03/01/2016 Miscellaneous    Patient presented to ED following a fall; she reports she landed on her back and is now experiencing back pain. The patient was evaluated and discharge home same day with pain control.    05/01/2016 Imaging    CT CAP IMPRESSION: 1. Stable exam.  No new or progressive disease  identified. 2. No evidence for residual or recurrent adenopathy within the chest. 3. Stable bone metastasis.    05/01/2016 Imaging    BONE SCAN IMPRESSION: 1. Small focal uptake involving the anterior rib ends of the left second and right fourth ribs, new since the prior bone scan. The location of this uptake is more suggestive of a traumatic or inflammatory etiology as opposed to metastatic disease. No other evidence of metastatic disease. 2. There are other areas of stable uptake which are most likely degenerative/reactive, including right shoulder and cervical spine uptake and uptake along the right femur adjacent to the ORIF hardware.    05/20/2016 Imaging    NM PET Skull to Thigh IMPRESSION: 1. There multiple metastatic foci in the bony pelvis, including some new abnormal foci compare to the prior PET-CT, compatible with mildly progressive bony metastatic disease. Also, a left T11 vertebral hypermetabolic metastatic lesion is observed, new compared to the prior exam. 2. No active extraosseous malignancy is currently identified. 3. Right anterior fourth rib healing fracture, appears be    06/04/2016 - 01/08/2017 Chemotherapy    Second line chemotherapy Kadcyla every 3 weeks started on 06/04/16 and stopped on 01/08/17 due to mixed response.      10/05/2016 Imaging    CT CAP and Bone Scan:  Bones: left intramedullary rod and left femoral head neck screw in place. In the proximal diaphysis of the left femur there is cortical thickening and irregularity. No lytic or blastic bone lesions. No fracture or vertebral endplate destruction.  No definite evidence of recurrent disease in the chest, abdomen, or pelvis.    10/28/2016 Imaging    CT A/P IMPRESSION: No acute process demonstrated in the abdomen or pelvis. No evidence of bowel obstruction or inflammation.    11/04/2016 Imaging    DG foot complete left: IMPRESSION: No fracture or dislocation.  No soft tissue abnormality     11/23/2016 Imaging    CT RIGHT FEMUR IMPRESSION: Negative CT scan of the right thigh.  No visible metastatic disease.  CT LEFT FEMUR IMPRESSION: 1. Postsurgical changes related to prior cephalomedullary rod fixation of the left femur with linear lucency along the anterolateral proximal femoral cortex at level of the lesser trochanter, suspicious for nondisplaced fracture. 2. Slightly permeative appearance of the proximal left femoral cortex at the site of known prior osseous metastases is largely unchanged. 3. Unchanged patchy lucency and sclerosis along the anterior acetabulum, which appears to correspond with an area of faint uptake on the prior PET-CT, suspicious for osseous metastasis. These results will be called to the ordering clinician or representative by the Radiologist Assistant, and communication documented in the PACS or zVision Dashboard.      01/27/2017 Imaging    IMPRESSION: 1. Mixed response to osseous metastasis. Some hypermetabolic lesions are new and increasingly hypermetabolic, while another index lesion is decreasingly hypermetabolic. 2. No evidence of hypermetabolic soft tissue metastasis.     02/08/2017 -  Antibody Plan    -Exemestane 25 mg daily since 02/08/17 -Herceptin every 3 week since 02/08/17 -Oral Lanpantinib 1551m and decreased to 5039mdue to diarrhea and depression. Due to disease progression she swithced to Verzenio 15038mn 05/04/17. She did not toelrate well so she was switched to Ibrance 100m40m 05/31/17. Due to her neutropenia, Ibrance dose reduced to 75 mg daily, 3 weeks on, one-week off.     04/30/2017 PET scan    IMPRESSION: 1. Primarily increase in hypermetabolism of osseous metastasis. An isolated right acetabular lesion is decreased in hypermetabolism since the prior exam. 2. No evidence of hypermetabolic soft tissue metastasis. 3. Similar non FDG avid right-sided thyroid nodule, favoring a benign etiology. Recommend attention on  follow-up.    08/30/2017 PET scan    08/30/2017 PET Scan  IMPRESSION: 1. Worsening osseous metastatic disease. 2. Focal hypermetabolism in the central spinal canal the L1 level, similar to the prior exam. Metastatic implant cannot be excluded. 3. Slight enlargement of a right thyroid nodule which now appears more cystic in character. Consider further evaluation with thyroid ultrasound. If patient is clinically hyperthyroid, consider nuclear medicine thyroid uptake and scan    08/30/2017 Progression    08/30/2017 PET Scan  IMPRESSION: 1. Worsening osseous metastatic disease. 2. Focal hypermetabolism in the central spinal canal the L1 level, similar to the prior exam. Metastatic implant cannot be excluded. 3. Slight enlargement of a right thyroid nodule which now appears more cystic in character. Consider further evaluation with thyroid ultrasound. If patient is clinically hyperthyroid, consider nuclear medicine thyroid uptake and scan     HISTORY OF PRESENTING ILLNESS (From Dr. LiveMariana Kaufmane on 02/06/2016):  WendFelisa Boniery77. 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 She has had an excellent response to 6 cycles of taxotere herceptin perjeta from 07-30-15 thru 11-29-15. She is continuing herceptin and perjeta every 3 weeks. She had extreme local pain with first xgeva injection on 01-16-16 and prefers resuming previous zometa. She began  letrozole ~ 01-16-16, has no complaints about this at all.  Last imaging was PET 12-25-15.  NOTE CTs, plain Xrays and bone scan did NOT show any of the extensively metastatic bone involvement 06-2015; MRI and PET did image disease clearly.  Patient continues to do very well overall. MRSA skin lesions on scalp and face improved quickly with doxycycline starting 01-16-16. She continues to use Dial soap and we have again strongly encouraged good hygiene with  other family members at home. She has had the 3 lymphedema PT sessions allowed by insurance, continues to do massage for LLE herself, still has not gotten the ordered compression apparatus for home and will follow up with PT on that. Swelling in LLE has improved with the interventions and does not seem as uncomfortable. She denies any pain. Appetite is excellent, bowels fine, no SOB, good energy, no problems with PAC, no noted changes in left breast. No bleeding. No fever or symptoms of infection. She slipped in tub last week, fell onto right arm fortunately without injury. She was upset by a visitor and shaky then, but no tremors otherwise.   She has two children and 4 grandchildren. She has had genetic testing and it was negative. She lives at home with her husband. She is very active. Her children live close to her.   CURRENT THERAPY:    -Exemestane '25mg'$  daily starting 02/08/17 -Herceptin every 3 weeks starting 02/08/17 -Neratinib started on '120mg'$  daily on 09/02/2017, will stop today September 21, 2017 due to poor tolerance.  INTERIM HISTORY:   DELPHINA SCHUM 52 y.o. female returns today for follow up. Pt presents to the office today accompanied by her husband and her sign language interpretor. She is not doing well at this time. She notes persistent vomiting, diarrhea, abdominal pain. she will be going on vacation to visit her daughter in [place] on 9/30-10/6 and her husband will be driving.   On review of systems, she reports nausea, vomiting, decreased appetite, metallic taste change, stabbing right lower back pain x 2-3 days, persistent diarrhea, abdominal pain/rib cage pain due to persistent vomiting, decreased energy levels. She has to sleep on her left side to alleviate her right lower back pain. She is unable to sleep on her back. She is taking medication for her pain. she denies any other symptoms. Pertinent positives are listed and detailed within the above HPI.  The patient is accompanied  by a sign language interpretor today, who the patient has elected to translate for her during this visit.    MEDICAL HISTORY:  Past Medical History:  Diagnosis Date  . Abnormal Pap smear   . Anxiety   . Breast cancer ([place])   . Cancer ([place]) 1999   breast-s/p mastectomy, chemo, rad  . CIN I (cervical intraepithelial neoplasia I) 2003   by colpo  . Congenital deafness   . Deaf   . Depression   . Radiation 07/12/15-07/26/15   left femur 30 Gy  . S/P bilateral oophorectomy     SURGICAL HISTORY: Past Surgical History:  Procedure Laterality Date  . ABDOMINAL HYSTERECTOMY    . INTRAMEDULLARY (IM) NAIL INTERTROCHANTERIC Left 06/26/2015   Procedure: LEFT BIOMET LONG AFFIXS NAIL;  Surgeon: [name], MD;  Location: [facility];  Service: Orthopedics;  Laterality: Left;  . IR GENERIC HISTORICAL  08/06/2015   IR US GUIDE VASC ACCESS LEFT 08/06/2015 Aletta Edouard, MD [facility]  . IR GENERIC HISTORICAL  08/06/2015   IR FLUORO GUIDE CV LINE LEFT 08/06/2015  Aletta Edouard, MD [facility]  . IR GENERIC HISTORICAL  03/05/2016   IR CV LINE INJECTION 03/05/2016 Markus Daft, MD [facility]  . MASTECTOMY     right breast  . MASTECTOMY    . OVARIAN CYST REMOVAL    . RADIOLOGY WITH ANESTHESIA Left 06/25/2015   Procedure: MRI OF LEFT HIP WITH OR WITHOUT CONTRAST;  Surgeon: Medication Radiologist, MD;  Location: [facility];  Service: Radiology;  Laterality: Left;  DR. MCINTYRE/MRI  . TUBAL LIGATION      SOCIAL HISTORY: Social History   Socioeconomic History  . Marital status: Married    Spouse name: Not on file  . Number of children: 2  . Years of education: Not on file  . Highest education level: Not on file  Occupational History  . Not on file  Social Needs  . Financial resource strain: Not on file  . Food insecurity:    Worry: Not on file    Inability: Not on file  . Transportation needs:    Medical: Not on file    Non-medical: Not on file  Tobacco Use  . Smoking status: Never Smoker  .  Smokeless tobacco: Never Used  Substance and Sexual Activity  . Alcohol use: No  . Drug use: No  . Sexual activity: Yes    Birth control/protection: Surgical  Lifestyle  . Physical activity:    Days per week: Not on file    Minutes per session: Not on file  . Stress: Not on file  Relationships  . Social connections:    Talks on phone: Not on file    Gets together: Not on file    Attends religious service: Not on file    Active member of club or organization: Not on file    Attends meetings of clubs or organizations: Not on file    Relationship status: Not on file  . Intimate partner violence:    Fear of current or ex partner: Not on file    Emotionally abused: Not on file    Physically abused: Not on file    Forced sexual activity: Not on file  Other Topics Concern  . Not on file  Social History Narrative  . Not on file    FAMILY HISTORY: Family History  Problem Relation Age of Onset  . Hypertension Mother   . Seizures Mother   . Alzheimer's disease Maternal Uncle   . Pancreatitis Maternal Grandmother   . Heart attack Maternal Grandfather     ALLERGIES:  is allergic to chlorhexidine; promethazine hcl; and diphenhydramine hcl.  MEDICATIONS:  Current Outpatient Medications  Medication Sig Dispense Refill  . Budesonide ER 9 MG TB24 Take 9 mg by mouth daily. Take with food early in the day. 30 tablet 2  . Cholecalciferol 2000 units CAPS Take 1 capsule by mouth daily.    . diphenoxylate-atropine (LOMOTIL) 2.5-0.025 MG tablet Take 1-2 tablets by mouth 4 (four) times daily as needed for diarrhea or loose stools. 30 tablet 0  . divalproex (DEPAKOTE) 250 MG DR tablet Take 1 tablet (250 mg total) by mouth at bedtime. 30 tablet 2  . exemestane (AROMASIN) 25 MG tablet Take 1 tablet (25 mg total) by mouth daily after breakfast. 30 tablet 0  . HYDROcodone-acetaminophen (NORCO) 10-325 MG tablet Take 1 tablet by mouth every 6 (six) hours as needed. 45 tablet 0  .  lidocaine-prilocaine (EMLA) cream Apply 1 application topically as needed. Apply to Porta-Cath 1-2 hours prior to access as directed.  30 g 2  . loperamide (IMODIUM) 2 MG capsule Take 2 capsules (4 mg total) by mouth 3 (three) times daily. May take additional tablets daily to titrate to 1-2 bowel movements each day 200 capsule 2  . Neratinib Maleate (NERLYNX) 40 MG tablet Week1: 3 tabs (111m) once daily. WFBPZ0 4 tabs (1653m daily. WeCHEN25 tabs (20053mdaily. WeeDPOE4nward: 6 tabs (240m41maily. Take w food 120 tablet 0  . potassium chloride (K-DUR) 10 MEQ tablet Take 10 mEq by mouth daily. Take 2 tablets daily.    . prochlorperazine (COMPAZINE) 10 MG tablet Take 1 tablet (10 mg total) by mouth every 6 (six) hours as needed for nausea or vomiting. 30 tablet 2   Current Facility-Administered Medications  Medication Dose Route Frequency Provider Last Rate Last Dose  . potassium chloride SA (K-DUR,KLOR-CON) CR tablet 20 mEq  20 mEq Oral Once FengTruitt Merle       Facility-Administered Medications Ordered in Other Visits  Medication Dose Route Frequency Provider Last Rate Last Dose  . heparin lock flush 100 unit/mL  500 Units Intracatheter Once PRN FengTruitt Merle      . loratadine (CLARITIN) tablet 10 mg  10 mg Oral Daily FengTruitt Merle   10 mg at 09/21/17 0940  . sodium chloride 0.9 % 1,000 mL with potassium chloride 10 mEq infusion   Intravenous Continuous LiveGordy Levan   Stopped at 09/10/15 1653  . sodium chloride 0.9 % injection 10 mL  10 mL Intravenous PRN Livesay, Lennis P, MD      . sodium chloride flush (NS) 0.9 % injection 10 mL  10 mL Intracatheter PRN FengTruitt Merle        REVIEW OF SYSTEMS:  Constitutional: Denies fevers, chills or abnormal night sweats , low appetite from stress, weight stable (+) scalp sensitivity  Eyes: Denies blurriness of vision, double vision or watery eyes Ears, nose, mouth, throat, and face: Denies mucositis or sore throat (+) deaf Respiratory: Denies  cough, dyspnea or wheezes  Cardiovascular: Denies palpitation, chest discomfort or lower extremity swelling Skin: No rashes or discolorations  Gastrointestinal:  (+) mild nausea (+) severe diarrhea, wears diaper, (+) abdominal pain  Lymphatics: Denies new lymphadenopathy or easy bruising. MSK: (+) right lower back/tailbone pain, (+) left Leg cramps and pain and right shoulder pain (5-8/10), resolved Behavioral/Psych: (+) stress, depression and anxiety  All other systems were reviewed with the patient and are negative.  PHYSICAL EXAMINATION:  ECOG PERFORMANCE STATUS: 2 Vitals:   09/21/17 0828  BP: 117/85  Pulse: 91  Resp: 18  Temp: 97.9 F (36.6 C)  TempSrc: Oral  SpO2: 100%  Weight: 123 lb 12.8 oz (56.2 kg)  Height: _0  (1.702 m)    GENERAL:alert, no distress and comfortable  EYES: normal, conjunctiva are pink and non-injected, sclera clear OROPHARYNX:no exudate, no erythema and lips, buccal mucosa, and tongue normal  NECK: supple, thyroid normal size, non-tender, without nodularity LYMPH:  no palpable lymphadenopathy in the cervical, axillary or inguinal LUNGS: clear to auscultation and percussion with normal breathing effort HEART: regular rate & rhythm and no murmurs and no lower extremity edema ABDOMEN:abdomen soft, non-tender and normal bowel sounds. No organomegaly. Musculoskeletal:no cyanosis of digits and no clubbing  PSYCH: alert & oriented x 3 with fluent speech NEURO: no focal motor/sensory deficits Breasts: Breast inspection showed Right breast is surgically absent status post reconstruction. Palpation of the breasts and axilla revealed no obvious mass that I could appreciate.  LABORATORY DATA:  I have reviewed the data as listed CBC Latest Ref Rng & Units 09/21/2017 08/31/2017 08/23/2017  WBC 3.9 - 10.3 K/uL 8.5 3.0(L) 2.8(L)  Hemoglobin 11.6 - 15.9 g/dL 12.5 11.1(L) 11.1(L)  Hematocrit 34.8 - 46.6 % 35.6 31.6(L) 31.8(L)  Platelets 145 - 400 K/uL 289 237  128(L)   CMP Latest Ref Rng & Units 09/21/2017 08/31/2017 08/23/2017  Glucose 70 - 99 mg/dL 162(H) 86 91  BUN 6 - 20 mg/dL _0 Creatinine 0.44 - 1.00 mg/dL 1.14(H) 1.16(H) 0.94  Sodium 135 - 145 mmol/L 139 140 140  Potassium 3.5 - 5.1 mmol/L 2.2(LL) 3.8 3.4(L)  Chloride 98 - 111 mmol/L 103 104 105  CO2 22 - 32 mmol/L _1 Calcium 8.9 - 10.3 mg/dL 9.5 8.8(L) 9.1  Total Protein 6.5 - 8.1 g/dL 7.4 6.8 6.9  Total Bilirubin 0.3 - 1.2 mg/dL 2.0(H) 2.8(H) 1.9(H)  Alkaline Phos 38 - 126 U/L 77 65 62  AST 15 - 41 U/L 10(L) 17 15  ALT 0 - 44 U/L _2 TUMOR MARKER  CA 27.29 Results for ADRIANN, THAU (MRN [id number]) as of 08/26/2017 16:56  Ref. Range 03/22/2017 10:06 05/03/2017 08:55 06/14/2017 08:06 07/19/2017 13:36 08/23/2017 08:12  CA 27.29 Latest Ref Range: 0.0 - 38.6 U/mL 36.5 29.3 35.6 49.2 (H) 62.0 (H)    PROCEDURES:   RADIOGRAPHIC STUDIES:  08/30/2017 PET Scan  IMPRESSION: 1. Worsening osseous metastatic disease. 2. Focal hypermetabolism in the central spinal canal the L1 level, similar to the prior exam. Metastatic implant cannot be excluded. 3. Slight enlargement of a right thyroid nodule which now appears more cystic in character. Consider further evaluation with thyroid ultrasound. If patient is clinically hyperthyroid, consider nuclear medicine thyroid uptake and scan.  PET Scan 04/30/17 IMPRESSION: 1. Primarily increase in hypermetabolism of osseous metastasis. An isolated right acetabular lesion is decreased in hypermetabolism since the prior exam. 2. No evidence of hypermetabolic soft tissue metastasis. 3. Similar non FDG avid right-sided thyroid nodule, favoring a benign etiology. Recommend attention on follow-up.  PET SCAN: 01/27/17 IMPRESSION: 1. Mixed response to osseous metastasis. Some hypermetabolic lesions are new and increasingly hypermetabolic, while another index lesion is decreasingly hypermetabolic. 2. No evidence of hypermetabolic soft tissue  metastasis.  CT A/P IMPRESSION: 10/28/16 No acute process demonstrated in the abdomen or pelvis. No evidence of bowel obstruction or inflammation.  CT CAP and Bone Scan: 10/05/16  Bones: left intramedullary rod and left femoral head neck screw in place. In the proximal diaphysis of the left femur there is cortical thickening and irregularity. No lytic or blastic bone lesions. No fracture or vertebral endplate destruction.  No definite evidence of recurrent disease in the chest, abdomen, or pelvis.  PET 05/20/2016 IMPRESSION: 1. There multiple metastatic foci in the bony pelvis, including some new abnormal foci compare to the prior PET-CT, compatible with mildly progressive bony metastatic disease. Also, a left T11 vertebral hypermetabolic metastatic lesion is observed, new compared to the prior exam. 2. No active extraosseous malignancy is currently identified. 3. Right anterior fourth rib healing fracture, appears benign.  NM Bone Scan 05/01/2016 IMPRESSION: 1. Small focal uptake involving the anterior rib ends of the left second and right fourth ribs, new since the prior bone scan. The location of this uptake is more suggestive of a traumatic or inflammatory etiology as opposed to metastatic disease. No other evidence of metastatic disease. 2. There are other areas of stable uptake which  are most likely degenerative/reactive, including right shoulder and cervical spine uptake and uptake along the right femur adjacent to the ORIF Hardware.  CT CAP 05/01/2016 IMPRESSION: 1. Stable exam.  No new or progressive disease identified. 2. No evidence for residual or recurrent adenopathy within the chest. 3. Stable bone metastasis.  I have personally reviewed the radiological images as listed and agreed with the findings in the report.  ASSESSMENT & PLAN:  Mrs. Hamor is a 52 y.o. postmenopause female with:  1. 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 -Restaging PET 12-25-15 markedly improved of bone metastasis after initial systemic chemo  -she was switched to letrozole and maintenance Herceptin and the perjeta on 01/16/16 which stopped on 05/14/16.  -Did not tolerate SQ xgeva and it was changed to zometa in 06/2015 -Continue monitoring ECHO every 3 months when she is on herceptin -I previously reviewed her CT scan and bone scan from 05/01/2016, which showed stable disease overall, a few new uptake of anterior ribs on bone scan are indeterminate. -I previously discussed her restaging PET scan from 05/20/2016. Unfortunately it showed mild disease progression of her bone metastasis  -I have changed her treatment to second line Kadcyla and letrozole on 06/04/16, she is tolerating very well. She declined other chemo.  -We previously received records from [facility] in [place], [place]. She underwent CT CAP w contrast on 10/02/16 and bone scan on 09/21/16. CT shows no evidence of recurrent disease in the chest, abdomen, or pelvis; Bone scan indicates no lytic or blastic bone lesions.  -She has been on Zometa every 4-6 weeks since June 2017. We previously changed to every 3 months in 12/2016. 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  No other visceral organ metastasis. She is clinically doing well.  -Kadcyla was switched to Exemestane, oral HER2 antibody treatment lapatinib and herceptin starting 02/08/17. -She tried lapatinib, could not tolerate and stopped. -Her PET Scan from 04/30/17 revealed a primarily increase in hypermetabolism of osseous metastasis. There are no new lesions.  I reviewed the images and compared to  the previous images in person with patient and her husband. I suspect her progression is due to her not being able to fully tolerate lapatinib -I previously discussed options with clinical trial for the new Her2 antibody conjugate Trastuzumab deruxtecan, the phase 3 clinical trial has been open, and the closest site is in [place] and [employer]. She is not interested in going to [place] at this time. -She did not respond to CDK4/6 inhibitor -Her 08/30/2017 PET Scan showed disease progression in her bones, no other new visceral metastasis. I reviewed her PET images with her in person. She denies hip or pelvic pain and has stable right shoulder and left hip pain from before.  -Will stop Ibrance due to disease progression, and started her on Neratinib -She tolerated neratinib very poorly, with diarrhea, nausea, vomiting, taste change and poor appetite.  Will stop today. -Discussed with the patient and her husband today that we will be changing her chemotherapy to Taxol weekly starting when she returns from vacation, 10/9. Advised that the patient continue to take her anti-emetic PRN.  --Chemotherapy consent: Side  effects including but does not not limited to, fatigue, nausea, vomiting, diarrhea, hair loss, neuropathy, fluid retention, renal and kidney dysfunction, neutropenic fever, needed for blood transfusion, bleeding, were discussed with patient in great detail. She agrees to proceed. -The goal of therapy is palliative -Discussed that she will continue IV chemo for a minimum of 3 months and maximum 5-6 months with a break afterwards.  -Due to her new low back pain, which is likely related to her extensive pelvic metastasis, I will refer her to Dr. Dorathy Daft for palliative radiation -Due to patient upcoming vacation, she will receive next dose Herceptin on 10/9.  -I discussed that I will also add Perjeta to her regimen, which the patient has had in the past and tolerated well. This will start on 10/9.   -Discussed with the patient that her nausea and her taste change should recover within the next week.  -I reviewed the patient's labs with her today, labs are stable at this time. Will obtain Herceptin and IV fluids today.  -Weekly taxol X3 starting 10/9 -Herceptin and Perjeta on 10/9   2. Right lower back pain, tailbone pain -Due to the patient's right lower back pain/tailbone pain, I recommended radiation therapy to the patient's lower back and tailbone region to help alleviate her pain, to which the patient agrees with radiation treatment today. The patient has received radiation therapy to her left hip in 2017 with Radiation Oncologist, Dr. Sondra Come in the past. I will send a message to Dr. Sondra Come today for an urgent consultation.  -I will also follow up with Dr. Sondra Come regarding coordination of chemotherapy and radiation treatments. The goal would be for the patient to have her radiation treatments completed before her vacation on 9/30, if possible.    3. History of MRSA skin infection scalp, boil at low back scalp and upper lip -I prescribed doxycycline on 04/24/16 for 7 days  -Resolved now.  4. Congenital deafness -needs sign language interpreter for all visits.  -Husband also deaf.  5. premature surgical menopause -post BSO 2067fr pelvic pain and ovarian cysts, likely was also beneficial from standpoint of the breast cancer. -hysterectomy   6. Intermittent anemia  -mild, possible related to prior chemo -She is asymptomatic, we'll continue close monitoring  7. Hypokalemia  -She has had intermittent hypokalemia -Continue on potassium 20 mEq daily.  -K increased to 3.8 on 08/31/2017 -Potassium 2.2 today, 09/21/2017, patient will receive IV normal saline, with IV potassium, and PO potassium 20 mEq today.  -She is only taking oral potassium 10 mEq daily daily, will increase to 30 m equal daily for the next 3 days, then back to 20 Meq daily   8. Goal of care discussion  -We  previously discussed the incurable nature of her cancer, and the overall poor prognosis, especially if she does not have good response to chemotherapy or progress on chemo.  -The patient understands the goal of care is palliative. -she is full code for now   9. Bilateral Leg Pain -She initially presented with worsening left leg and hip pain secondary to bone mets, she had surgery before, this was much improved after her prior chemo -The patient previously took [place] this pain, 1 tablet every 6 hours as needed for pain. Which helped. -I previously encouraged her to follow up with Dr. YLorin Mercyabout the pain in her left leg. -She requests to have bed for all infusions  -She has had bilateral thigh pain, possible related to her pelvic bone metastasis -Pain (  7-8/10) along with cramps. She will continue calcium and Norco as needed. I previously suggested that she try tylenol for pain during the day. She is willing to try.   10. Insomnia, Depression, Stress  -I previously gave her Ambien 36m, but it did not work well.  -continue ativan 1 mg to take at night as needed  -mirtazapine did not work, she also tried trazodone from PCP which gave her a rash. She stopped taking it.  -After the death of her dog she has been stressed and sad from how it happened.  -I previously offered her the chance to speak with our counselor or a therapist, she declined at this time. She denied inclinations of self harm.  -I also previously offered her the choice of antidepressants. She will try coping without medication for now. I previously encouraged her to speak with family or friends about her feelings.   11.  Hyperbilirubinemia  -She has developed mild hyperbilirubinemia recently, TBIL 1.2-2.7, okay to continue treatment  -increased to 2.8 on 08/31/2017 -Her total bilirubin 2.0 today, overall stable  12. Mild dyspnea  -Her 08/04/17 ECHO shows mostly adequate heart function -If her breathing worsens I previously  encouraged her to contact the clinic  - will continue monitoring   13. Diarrhea -She complains of severe diarrhea that made her wear a diaper.  This is secondary to neratinib -I previously advised her to take imodium 2 capsules in the morning, and as needed up to 8 tablets a day, use Lomotil as needed -I also previously advised her to drink fluids and maintain a healthy diet.  -Patient will discontinue Neratinib today due to persistent diarrhea.    Plan:  -IV fluids with IV potassium, PO Potassium 20 mEq, and Herceptin today -Weekly taxol X3 starting 10/9 -Herceptin and Perjeta on 10/9  -Refer her back to Dr. KRanda Ngofor palliative radiation to her low spine and pelvic for pain control  -lab and f/y with NP LLattie Hawnext week for f/u of hypokalemia and recovery from Neratinib    Pt had many questions. All questions were answered. The patient knows to call the clinic with any problems, questions or concerns.  I spent 30 minutes counseling the patient face to face. The total time spent in the appointment was 40 minutes and more than 50% was on counseling.     YTruitt Merle MD 09/21/2017   I, Soijett Blue am acting as scribe for Dr. YTruitt Merle   I have reviewed the above documentation for accuracy and completeness, and I agree with the above.

## 2017-09-21 ENCOUNTER — Inpatient Hospital Stay: Payer: Medicaid Other

## 2017-09-21 ENCOUNTER — Encounter: Payer: Self-pay | Admitting: Hematology

## 2017-09-21 ENCOUNTER — Inpatient Hospital Stay (HOSPITAL_BASED_OUTPATIENT_CLINIC_OR_DEPARTMENT_OTHER): Payer: Medicaid Other | Admitting: Hematology

## 2017-09-21 ENCOUNTER — Telehealth: Payer: Self-pay | Admitting: Hematology

## 2017-09-21 VITALS — BP 117/85 | HR 91 | Temp 97.9°F | Resp 18 | Ht 67.0 in | Wt 123.8 lb

## 2017-09-21 DIAGNOSIS — Z8614 Personal history of Methicillin resistant Staphylococcus aureus infection: Secondary | ICD-10-CM

## 2017-09-21 DIAGNOSIS — D649 Anemia, unspecified: Secondary | ICD-10-CM | POA: Diagnosis not present

## 2017-09-21 DIAGNOSIS — C50919 Malignant neoplasm of unspecified site of unspecified female breast: Secondary | ICD-10-CM

## 2017-09-21 DIAGNOSIS — D61818 Other pancytopenia: Secondary | ICD-10-CM

## 2017-09-21 DIAGNOSIS — K5900 Constipation, unspecified: Secondary | ICD-10-CM

## 2017-09-21 DIAGNOSIS — C50911 Malignant neoplasm of unspecified site of right female breast: Secondary | ICD-10-CM

## 2017-09-21 DIAGNOSIS — C7951 Secondary malignant neoplasm of bone: Secondary | ICD-10-CM | POA: Diagnosis not present

## 2017-09-21 DIAGNOSIS — Z95828 Presence of other vascular implants and grafts: Secondary | ICD-10-CM

## 2017-09-21 DIAGNOSIS — E876 Hypokalemia: Secondary | ICD-10-CM | POA: Diagnosis not present

## 2017-09-21 DIAGNOSIS — F329 Major depressive disorder, single episode, unspecified: Secondary | ICD-10-CM | POA: Diagnosis not present

## 2017-09-21 DIAGNOSIS — Z79899 Other long term (current) drug therapy: Secondary | ICD-10-CM | POA: Diagnosis not present

## 2017-09-21 DIAGNOSIS — F419 Anxiety disorder, unspecified: Secondary | ICD-10-CM | POA: Diagnosis not present

## 2017-09-21 DIAGNOSIS — H905 Unspecified sensorineural hearing loss: Secondary | ICD-10-CM

## 2017-09-21 DIAGNOSIS — C799 Secondary malignant neoplasm of unspecified site: Principal | ICD-10-CM

## 2017-09-21 DIAGNOSIS — Z170 Estrogen receptor positive status [ER+]: Secondary | ICD-10-CM

## 2017-09-21 DIAGNOSIS — Z5112 Encounter for antineoplastic immunotherapy: Secondary | ICD-10-CM | POA: Diagnosis not present

## 2017-09-21 DIAGNOSIS — Z79811 Long term (current) use of aromatase inhibitors: Secondary | ICD-10-CM

## 2017-09-21 DIAGNOSIS — G4700 Insomnia, unspecified: Secondary | ICD-10-CM | POA: Diagnosis not present

## 2017-09-21 LAB — CBC WITH DIFFERENTIAL/PLATELET
BASOS PCT: 0 %
Basophils Absolute: 0 10*3/uL (ref 0.0–0.1)
EOS ABS: 0 10*3/uL (ref 0.0–0.5)
Eosinophils Relative: 0 %
HCT: 35.6 % (ref 34.8–46.6)
Hemoglobin: 12.5 g/dL (ref 11.6–15.9)
LYMPHS ABS: 1.8 10*3/uL (ref 0.9–3.3)
Lymphocytes Relative: 21 %
MCH: 34.6 pg — ABNORMAL HIGH (ref 25.1–34.0)
MCHC: 35.2 g/dL (ref 31.5–36.0)
MCV: 98.3 fL (ref 79.5–101.0)
MONO ABS: 0.3 10*3/uL (ref 0.1–0.9)
MONOS PCT: 4 %
Neutro Abs: 6.3 10*3/uL (ref 1.5–6.5)
Neutrophils Relative %: 75 %
Platelets: 289 10*3/uL (ref 145–400)
RBC: 3.62 MIL/uL — ABNORMAL LOW (ref 3.70–5.45)
RDW: 12.6 % (ref 11.2–14.5)
WBC: 8.5 10*3/uL (ref 3.9–10.3)

## 2017-09-21 LAB — COMPREHENSIVE METABOLIC PANEL
ALBUMIN: 3.7 g/dL (ref 3.5–5.0)
ALK PHOS: 77 U/L (ref 38–126)
ALT: 9 U/L (ref 0–44)
ANION GAP: 14 (ref 5–15)
AST: 10 U/L — ABNORMAL LOW (ref 15–41)
BILIRUBIN TOTAL: 2 mg/dL — AB (ref 0.3–1.2)
BUN: 16 mg/dL (ref 6–20)
CALCIUM: 9.5 mg/dL (ref 8.9–10.3)
CO2: 22 mmol/L (ref 22–32)
Chloride: 103 mmol/L (ref 98–111)
Creatinine, Ser: 1.14 mg/dL — ABNORMAL HIGH (ref 0.44–1.00)
GFR calc non Af Amer: 54 mL/min — ABNORMAL LOW (ref >60)
GLUCOSE: 162 mg/dL — AB (ref 70–99)
Potassium: 2.2 mmol/L — CL (ref 3.5–5.1)
Sodium: 139 mmol/L (ref 135–145)
TOTAL PROTEIN: 7.4 g/dL (ref 6.5–8.1)

## 2017-09-21 MED ORDER — SODIUM CHLORIDE 0.9 % IV SOLN
Freq: Once | INTRAVENOUS | Status: AC
  Administered 2017-09-21: 10:00:00 via INTRAVENOUS
  Filled 2017-09-21: qty 250

## 2017-09-21 MED ORDER — TRASTUZUMAB CHEMO 150 MG IV SOLR
6.0000 mg/kg | Freq: Once | INTRAVENOUS | Status: AC
  Administered 2017-09-21: 357 mg via INTRAVENOUS
  Filled 2017-09-21: qty 17

## 2017-09-21 MED ORDER — SODIUM CHLORIDE 0.9 % IV SOLN
Freq: Once | INTRAVENOUS | Status: AC
  Administered 2017-09-21: 10:00:00 via INTRAVENOUS
  Filled 2017-09-21: qty 1000

## 2017-09-21 MED ORDER — PROCHLORPERAZINE EDISYLATE 10 MG/2ML IJ SOLN
INTRAMUSCULAR | Status: AC
  Filled 2017-09-21: qty 2

## 2017-09-21 MED ORDER — POTASSIUM CHLORIDE IN NACL 40-0.9 MEQ/L-% IV SOLN: INTRAVENOUS | Status: DC

## 2017-09-21 MED ORDER — ACETAMINOPHEN 325 MG PO TABS
ORAL_TABLET | ORAL | Status: AC
  Filled 2017-09-21: qty 2

## 2017-09-21 MED ORDER — ACETAMINOPHEN 325 MG PO TABS
650.0000 mg | ORAL_TABLET | Freq: Once | ORAL | Status: AC
  Administered 2017-09-21: 650 mg via ORAL

## 2017-09-21 MED ORDER — POTASSIUM CHLORIDE CRYS ER 20 MEQ PO TBCR: 20.0000 meq | EXTENDED_RELEASE_TABLET | Freq: Once | ORAL | Status: DC

## 2017-09-21 MED ORDER — SODIUM CHLORIDE 0.9 % IJ SOLN
10.0000 mL | INTRAMUSCULAR | Status: DC | PRN
  Administered 2017-09-21: 10 mL via INTRAVENOUS
  Filled 2017-09-21: qty 10

## 2017-09-21 MED ORDER — POTASSIUM CHLORIDE CRYS ER 20 MEQ PO TBCR
EXTENDED_RELEASE_TABLET | ORAL | Status: AC
  Filled 2017-09-21: qty 1

## 2017-09-21 MED ORDER — HEPARIN SOD (PORK) LOCK FLUSH 100 UNIT/ML IV SOLN
500.0000 [IU] | Freq: Once | INTRAVENOUS | Status: AC | PRN
  Administered 2017-09-21: 500 [IU]
  Filled 2017-09-21: qty 5

## 2017-09-21 MED ORDER — LORATADINE 10 MG PO TABS
ORAL_TABLET | ORAL | Status: AC
  Filled 2017-09-21: qty 1

## 2017-09-21 MED ORDER — PROCHLORPERAZINE EDISYLATE 10 MG/2ML IJ SOLN
10.0000 mg | Freq: Once | INTRAMUSCULAR | Status: AC
  Administered 2017-09-21: 10 mg via INTRAVENOUS
  Filled 2017-09-21: qty 2

## 2017-09-21 MED ORDER — SODIUM CHLORIDE 0.9% FLUSH
10.0000 mL | INTRAVENOUS | Status: DC | PRN
  Administered 2017-09-21: 10 mL
  Filled 2017-09-21: qty 10

## 2017-09-21 MED ORDER — LORATADINE 10 MG PO TABS
10.0000 mg | ORAL_TABLET | Freq: Every day | ORAL | Status: DC
  Administered 2017-09-21: 10 mg via ORAL

## 2017-09-21 NOTE — Patient Instructions (Signed)
BE SURE YOU ARE TAKING TWO OF YOUR 10MEQ POTASSIUM PILLS DAILY.   [facility] Discharge Instructions for Patients Receiving Chemotherapy  Today you received the following chemotherapy agents Herceptin.  To help prevent nausea and vomiting after your treatment, we encourage you to take your nausea medication as prescribed.   If you develop nausea and vomiting that is not controlled by your nausea medication, call the clinic.   BELOW ARE SYMPTOMS THAT SHOULD BE REPORTED IMMEDIATELY:  *FEVER GREATER THAN 100.5 F  *CHILLS WITH OR WITHOUT FEVER  NAUSEA AND VOMITING THAT IS NOT CONTROLLED WITH YOUR NAUSEA MEDICATION  *UNUSUAL SHORTNESS OF BREATH  *UNUSUAL BRUISING OR BLEEDING  TENDERNESS IN MOUTH AND THROAT WITH OR WITHOUT PRESENCE OF ULCERS  *URINARY PROBLEMS  *BOWEL PROBLEMS  UNUSUAL RASH Items with * indicate a potential emergency and should be followed up as soon as possible.  Feel free to call the clinic should you have any questions or concerns. The clinic phone number is [phone].  Please show the [facility] at check-in to the Emergency Department and triage nurse.     Hypokalemia Hypokalemia means that the amount of potassium in the blood is lower than normal.Potassium is a chemical that helps regulate the amount of fluid in the body (electrolyte). It also stimulates muscle tightening (contraction) and helps nerves work properly.Normally, most of the body's potassium is inside of cells, and only a very small amount is in the blood. Because the amount in the blood is so small, minor changes to potassium levels in the blood can be life-threatening. What are the causes? This condition may be caused by:  Antibiotic medicine.  Diarrhea or vomiting. Taking too much of a medicine that helps you have a bowel movement (laxative) can cause diarrhea and lead to hypokalemia.  Chronic kidney disease (CKD).  Medicines that help the body get rid of  excess fluid (diuretics).  Eating disorders, such as bulimia.  Low magnesium levels in the body.  Sweating a lot.  What are the signs or symptoms? Symptoms of this condition include:  Weakness.  Constipation.  Fatigue.  Muscle cramps.  Mental confusion.  Skipped heartbeats or irregular heartbeat (palpitations).  Tingling or numbness.  How is this diagnosed? This condition is diagnosed with a blood test. How is this treated? Hypokalemia can be treated by taking potassium supplements by mouth or adjusting the medicines that you take. Treatment may also include eating more foods that contain a lot of potassium. If your potassium level is very low, you may need to get potassium through an IV tube in one of your veins and be monitored in the hospital. Follow these instructions at home:  Take over-the-counter and prescription medicines only as told by your health care provider. This includes vitamins and supplements.  Eat a healthy diet. A healthy diet includes fresh fruits and vegetables, whole grains, healthy fats, and lean proteins.  If instructed, eat more foods that contain a lot of potassium, such as: ? Nuts, such as peanuts and pistachios. ? Seeds, such as sunflower seeds and pumpkin seeds. ? Peas, lentils, and lima beans. ? Whole grain and bran cereals and breads. ? Fresh fruits and vegetables, such as apricots, avocado, bananas, cantaloupe, kiwi, oranges, tomatoes, asparagus, and potatoes. ? Orange juice. ? Tomato juice. ? Red meats. ? Yogurt.  Keep all follow-up visits as told by your health care provider. This is important. Contact a health care provider if:  You have weakness that gets worse.  You feel your heart pounding or racing.  You vomit.  You have diarrhea.  You have diabetes (diabetes mellitus) and you have trouble keeping your blood sugar (glucose) in your target range. Get help right away if:  You have chest pain.  You have shortness of  breath.  You have vomiting or diarrhea that lasts for more than 2 days.  You faint. This information is not intended to replace advice given to you by your health care provider. Make sure you discuss any questions you have with your health care provider. Document Released: 12/29/2004 Document Revised: 08/17/2015 Document Reviewed: 08/17/2015 Elsevier Interactive Patient Education  2018 [employer].

## 2017-09-21 NOTE — Progress Notes (Signed)
Pt. Stated she is only taking 61meq K+ at home. Instructed to follow directions on the bottle and be sure to take two tablets to equal 44meq daily. Dr. Burr Medico also in to see patient and follow up on this matter.

## 2017-09-21 NOTE — Telephone Encounter (Signed)
Scheduled appt per 9/10 los - gave patient aVS and calender per los.  Per YF labs next week due to f/u being pushed two weeks out.

## 2017-09-21 NOTE — Progress Notes (Signed)
DISCONTINUE ON PATHWAY REGIMEN - Breast     A cycle is every 21 days:     Ado-trastuzumab emtansine   **Always confirm dose/schedule in your pharmacy ordering system**  REASON: Disease Progression PRIOR TREATMENT: [id number]: Ado-Trastuzumab Emtansine q21 Days Until Progression or Toxicity TREATMENT RESPONSE: Unable to Evaluate  START OFF PATHWAY REGIMEN - Breast   [id number]:Weekly paclitaxel + (trastuzumab + pertuzumab q21 days):   A cycle is every 21 days:     Pertuzumab      Pertuzumab      Trastuzumab-xxxx      Trastuzumab-xxxx      Paclitaxel   **Always confirm dose/schedule in your pharmacy ordering system**  Patient Characteristics: Distant Metastases or Locoregional Recurrent Disease - Unresected, HER2 Positive, ER Positive, Chemotherapy + HER2-Targeted Therapy, Third Line Therapeutic Status: Distant Metastases BRCA Mutation Status: Absent ER Status: Positive (+) HER2 Status: Positive (+) PR Status: Negative (-) Line of therapy: Third Line Intent of Therapy: Non-Curative / Palliative Intent, Discussed with Patient

## 2017-09-22 ENCOUNTER — Telehealth: Payer: Self-pay | Admitting: Radiation Oncology

## 2017-09-22 NOTE — Telephone Encounter (Signed)
LVM for Ms. Hover. Maddie with [facility] [phone], #2) called to let me knw that she scheduled Sherry Ruffing for patient interpreter for tomorrow morning's appt 7:45 arrival time.

## 2017-09-22 NOTE — Progress Notes (Signed)
Histology and Location of Primary Cancer: metastatic ER+ HER 2+ right breast cancer involving bone  Sites of Visceral and Bony Metastatic Disease: PET scan 08/30/17: IMPRESSION: 1. Worsening osseous metastatic disease. 2. Focal hypermetabolism in the central spinal canal the L1 level, similar to the prior exam. Metastatic implant cannot be excluded. 3. Slight enlargement of a right thyroid nodule which now appears more cystic in character. Consider further evaluation with thyroid ultrasound. If patient is clinically hyperthyroid, consider nuclear medicine thyroid uptake and scan.  Location(s) of Symptomatic Metastases: Per Dr. Burr Medico 09/21/17:  2. Right lower back pain, tailbone pain -Due to the patient's right lower back pain/tailbone pain, I recommended radiation therapy to the patient's lower back and tailbone region to help alleviate her pain, to which the patient agrees with radiation treatment today. The patient has received radiation therapy to her left hip in 2017 with Radiation Oncologist, Dr. Sondra Come in the past. I will send a message to Dr. Sondra Come today for an urgent consultation.  -I will also follow up with Dr. Sondra Come regarding coordination of chemotherapy and radiation treatments. The goal would be for the patient to have her radiation treatments completed before her vacation on 9/30, if possible.  Past/Anticipated chemotherapy by medical oncology, if any: Per Dr. Burr Medico 09/21/17:  1. 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 -Restaging PET 12-25-15 markedly improved of bone metastasis after initial systemic chemo  -she was switched to letrozole and maintenance Herceptin and the perjeta on 01/16/16 which stopped on 05/14/16.  -Did not tolerate SQ xgeva and it was changed to zometa in 06/2015 -Continue monitoring ECHO every 3 months when she is on  herceptin -I previously reviewed her CT scan and bone scan from 05/01/2016, which showed stable disease overall, a few new uptake of anterior ribs on bone scan are indeterminate. -I previously discussed her restaging PET scan from 05/20/2016. Unfortunately it showed mild disease progression of her bone metastasis  -I have changed her treatment to second line Kadcyla and letrozole on 06/04/16, she is tolerating very well. She declined other chemo.  -We previously received records from [facility] in [place], [place]. She underwent CT CAP w contrast on 10/02/16 and bone scan on 09/21/16. CT shows no evidence of recurrent disease in the chest, abdomen, or pelvis; Bone scan indicates no lytic or blastic bone lesions.  -She has been on Zometa every 4-6 weeks since June 2017. We previously changed to every 3 months in 12/2016. 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  No other visceral organ metastasis. She is clinically doing well.  -Kadcyla was switched to Exemestane, oral HER2 antibody treatment lapatinib and herceptin starting 02/08/17. -She tried lapatinib, could not tolerate and stopped. -Her PET Scan from 04/30/17 revealed a primarily increase in hypermetabolism of osseous metastasis. There are no new lesions.  I reviewed the images and compared to the previous images in person with patient and her husband. I suspect her progression is due to her not being able to fully tolerate lapatinib -I previously discussed options with clinical trial for the new Her2 antibody conjugate Trastuzumab deruxtecan, the phase 3 clinical trial has been open, and the closest site is  in [place] and [employer]. She is not interested in going to [place] at this time. -She did not respond to  CDK4/6 inhibitor -Her 08/30/2017 PET Scan showed disease progression in her bones, no other new visceral metastasis. I reviewed her PET images with her in person. She denies hip or pelvic pain and has stable right shoulder and left hip pain from before.  -Will stop Ibrance due to disease progression, and started her on Neratinib -She tolerated neratinib very poorly, with diarrhea, nausea, vomiting, taste change and poor appetite.  Will stop today. -Discussed with the patient and her husband today that we will be changing her chemotherapy to Taxol weekly starting when she returns from vacation, 10/9. Advised that the patient continue to take her anti-emetic PRN.  --Chemotherapy consent: Side effects including but does not not limited to, fatigue, nausea, vomiting, diarrhea, hair loss, neuropathy, fluid retention, renal and kidney dysfunction, neutropenic fever, needed for blood transfusion, bleeding, were discussed with patient in great detail. She agrees to proceed. -The goal of therapy is palliative -Discussed that she will continue IV chemo for a minimum of 3 months and maximum 5-6 months with a break afterwards.  -Due to her new low back pain, which is likely related to her extensive pelvic metastasis, I will refer her to Dr. Dorathy Daft for palliative radiation -Due to patient upcoming vacation, she will receive next dose Herceptin on 10/9.  -I discussed that I will also add Perjeta to her regimen, which the patient has had in the past and tolerated well. This will start on 10/9.  -Discussed with the patient that her nausea and her taste change should recover within the next week.  -I reviewed the patient's labs with her today, labs are stable at this time. Will obtain Herceptin and IV fluids today.  -Weekly taxol X3 starting 10/9 -Herceptin and Perjeta on 10/9   Pain on a scale of 0-10 is: 5-7/10 in lower back. Pain medications help with pain.    If Spine Met(s), symptoms, if any,  include:  Bowel/Bladder retention or incontinence (please describe): No.   Numbness or weakness in extremities (please describe): LEFT foot is slightly numb and tingling on bottom of LEFT foot.  Current Decadron regimen, if applicable: None  Ambulatory status? Walker? Wheelchair?: Ambulatory  SAFETY ISSUES: Prior radiation?   07/12/2015 - 07/26/2015 Radiation Therapy    Left femur, 30 Gy in 10 fractions by Dr. Sondra Come       Pacemaker/ICD? No  Possible current pregnancy? N/A, pt is post-surgical  Is the patient on methotrexate? No  Current Complaints / other details:  Pt presents today for reconsult with Dr. Sondra Come. Pt is accompanied by husband. Pt is also accompanied by ASL interpreter, Juliann Pulse. Pt reports pain in LEFT shoulder from "sleeping wrong".   Loma Sousa, RN BSN

## 2017-09-22 NOTE — Telephone Encounter (Signed)
Pt called wanting a sign language interpreter for tomorrow morning. She does not want to use CAP and wants [employer]-p. [phone]. Called and left them a message for urgent request. Awaiting return call.

## 2017-09-23 ENCOUNTER — Encounter: Payer: Self-pay | Admitting: Radiation Oncology

## 2017-09-23 ENCOUNTER — Ambulatory Visit
Admission: RE | Admit: 2017-09-23 | Discharge: 2017-09-23 | Disposition: A | Discharge status: Home / Self Care | Payer: Medicaid Other | Source: Ambulatory Visit | Attending: Radiation Oncology | Admitting: Radiation Oncology

## 2017-09-23 ENCOUNTER — Other Ambulatory Visit: Payer: Self-pay

## 2017-09-23 VITALS — BP 107/69 | HR 74 | Temp 98.6°F | Resp 18 | Wt 122.6 lb

## 2017-09-23 DIAGNOSIS — H905 Unspecified sensorineural hearing loss: Secondary | ICD-10-CM | POA: Diagnosis not present

## 2017-09-23 DIAGNOSIS — Z79899 Other long term (current) drug therapy: Secondary | ICD-10-CM | POA: Diagnosis not present

## 2017-09-23 DIAGNOSIS — Z923 Personal history of irradiation: Secondary | ICD-10-CM | POA: Insufficient documentation

## 2017-09-23 DIAGNOSIS — Z853 Personal history of malignant neoplasm of breast: Secondary | ICD-10-CM | POA: Insufficient documentation

## 2017-09-23 DIAGNOSIS — C7951 Secondary malignant neoplasm of bone: Principal | ICD-10-CM

## 2017-09-23 DIAGNOSIS — C50911 Malignant neoplasm of unspecified site of right female breast: Secondary | ICD-10-CM

## 2017-09-23 DIAGNOSIS — Z85830 Personal history of malignant neoplasm of bone: Secondary | ICD-10-CM | POA: Diagnosis not present

## 2017-09-23 NOTE — Progress Notes (Signed)
Radiation Oncology         [phone] ________________________________  ReConsultation  Name: [name] MRN: [id number]  Date: 09/23/2017  DOB: April 06, 1965  CC:No primary care provider on file.  Truitt Merle, MD   REFERRING PHYSICIAN: Truitt Merle, MD  DIAGNOSIS: The encounter diagnosis was [name], right [facility]).  HISTORY OF PRESENT ILLNESS::[name] is a 52 y.o. female who is here to discuss metastatic breast cancer involving bone. She was originally seen in radiation oncology for radiation to the left femur in June 2017. She has been followed by Dr. Burr Medico in medical oncology for chemotherapy treatments and PET scans every 3 months. A new spine metastasis was noted on a PET scan on 01/27/17 and showed progression on 08/30/17. She has not tolerated oral antibody treatments well. She last saw Dr. Burr Medico yesterday, 09/21/17, presenting with right lower back and tailbone pain.   Report of her most recent PET scan on 08/30/17 showed: 1. Worsening osseous metastatic disease. 2. Focal hypermetabolism in the central spinal canal the L1 level, similar to the prior exam. Metastatic implant cannot be excluded. 3. Slight enlargement of a right thyroid nodule which now appears more cystic in character. Consider further evaluation with thyroid ultrasound. If patient is clinically hyperthyroid, consider nuclear medicine thyroid uptake and scan.  She reports left foot numbness and tingling, but notes this is not a new problem. She denies issues with diarrhea, nausea, and constipation. She notes having issues while on medicine prescribed by Dr. Burr Medico but has since stopped. She notes new left shoulder pain which she attributes to sleeping wrong.   PREVIOUS RADIATION THERAPY: Yes  07/12/15 - 07/25/17: Femur, Left/ 30 Gy in 10 fractions  PAST MEDICAL HISTORY:  has a past medical history of Abnormal Pap smear, Anxiety, Breast cancer ([place]), Cancer ([place]) (1999), CIN I (cervical  intraepithelial neoplasia I) (2003), Congenital deafness, Deaf, Depression, Radiation (07/12/15-07/26/15), and S/P bilateral oophorectomy.    PAST SURGICAL HISTORY: Past Surgical History:  Procedure Laterality Date  . ABDOMINAL HYSTERECTOMY    . INTRAMEDULLARY (IM) NAIL INTERTROCHANTERIC Left 06/26/2015   Procedure: LEFT BIOMET LONG AFFIXS NAIL;  Surgeon: [name], MD;  Location: [place];  Service: Orthopedics;  Laterality: Left;  . IR GENERIC HISTORICAL  08/06/2015   IR US GUIDE VASC ACCESS LEFT 08/06/2015 Aletta Edouard, MD [facility]  . IR GENERIC HISTORICAL  08/06/2015   IR FLUORO GUIDE CV LINE LEFT 08/06/2015 Aletta Edouard, MD [facility]  . IR GENERIC HISTORICAL  03/05/2016   IR CV LINE INJECTION 03/05/2016 Markus Daft, MD [facility]  . MASTECTOMY     right breast  . MASTECTOMY    . OVARIAN CYST REMOVAL    . RADIOLOGY WITH ANESTHESIA Left 06/25/2015   Procedure: MRI OF LEFT HIP WITH OR WITHOUT CONTRAST;  Surgeon: Medication Radiologist, MD;  Location: [place];  Service: Radiology;  Laterality: Left;  DR. MCINTYRE/MRI  . TUBAL LIGATION      FAMILY HISTORY: family history includes Alzheimer's disease in her maternal uncle; Heart attack in her maternal grandfather; Hypertension in her mother; Pancreatitis in her maternal grandmother; Seizures in her mother.  SOCIAL HISTORY:  reports that she has never smoked. She has never used smokeless tobacco. She reports that she does not drink alcohol or use drugs.  ALLERGIES: Chlorhexidine; Promethazine hcl; and Diphenhydramine hcl  MEDICATIONS:  Current Outpatient Medications  Medication Sig Dispense Refill  . Cholecalciferol 2000 units CAPS Take 1 capsule by mouth  daily.    . diphenoxylate-atropine (LOMOTIL) 2.5-0.025 MG tablet Take 1-2 tablets by mouth 4 (four) times daily as needed for diarrhea or loose stools. 30 tablet 0  . divalproex (DEPAKOTE) 250 MG DR tablet Take 1 tablet (250 mg total) by mouth at bedtime. 30 tablet 2  . exemestane  (AROMASIN) 25 MG tablet Take 1 tablet (25 mg total) by mouth daily after breakfast. 30 tablet 0  . HYDROcodone-acetaminophen (NORCO) 10-325 MG tablet Take 1 tablet by mouth every 6 (six) hours as needed. 45 tablet 0  . lidocaine-prilocaine (EMLA) cream Apply 1 application topically as needed. Apply to Porta-Cath 1-2 hours prior to access as directed. 30 g 2  . loperamide (IMODIUM) 2 MG capsule Take 2 capsules (4 mg total) by mouth 3 (three) times daily. May take additional tablets daily to titrate to 1-2 bowel movements each day 200 capsule 2  . potassium chloride (K-DUR) 10 MEQ tablet Take 10 mEq by mouth daily. Take 2 tablets daily.    . prochlorperazine (COMPAZINE) 10 MG tablet Take 1 tablet (10 mg total) by mouth every 6 (six) hours as needed for nausea or vomiting. 30 tablet 2  . Budesonide ER 9 MG TB24 Take 9 mg by mouth daily. Take with food early in the day. (Patient not taking: Reported on 09/23/2017) 30 tablet 2  . Neratinib Maleate (NERLYNX) 40 MG tablet Week1: 3 tabs (120mg ) once daily. UVOZ3: 4 tabs (160mg ) daily. GUYQ0: 5 tabs (200mg ) daily. HKVQ2 &onward: 6 tabs (240mg ) daily. Take w food (Patient not taking: Reported on 09/23/2017) 120 tablet 0   No current facility-administered medications for this encounter.    Facility-Administered Medications Ordered in Other Encounters  Medication Dose Route Frequency Provider Last Rate Last Dose  . sodium chloride 0.9 % 1,000 mL with potassium chloride 10 mEq infusion   Intravenous Continuous Gordy Levan, MD   Stopped at 09/10/15 1653  . sodium chloride 0.9 % injection 10 mL  10 mL Intravenous PRN Livesay, Lennis P, MD        REVIEW OF SYSTEMS:  A 15 point review of systems is documented in the electronic medical record. This was obtained by the nursing staff. However, I reviewed this with the patient to discuss relevant findings and make appropriate changes.     PHYSICAL EXAM:  weight is 122 lb 9.6 oz (55.6 kg). Her oral temperature is  98.6 F (37 C). Her blood pressure is 107/69 and her pulse is 74. Her respiration is 18 and oxygen saturation is 100%.   Lungs are clear to auscultation bilaterally. Heart has regular rate and rhythm. No palpable cervical, supraclavicular, or axillary adenopathy. Abdomen soft, non-tender, normal bowel sounds. Lower motor strength is 5/5 in the lower distal muscle groups.   ECOG = 1  0 - Asymptomatic (Fully active, able to carry on all predisease activities without restriction)  1 - Symptomatic but completely ambulatory (Restricted in physically strenuous activity but ambulatory and able to carry out work of a light or sedentary nature. For example, light housework, office work)  2 - Symptomatic, <50% in bed during the day (Ambulatory and capable of all self care but unable to carry out any work activities. Up and about more than 50% of waking hours)  3 - Symptomatic, >50% in bed, but not bedbound (Capable of only limited self-care, confined to bed or chair 50% or more of waking hours)  4 - Bedbound (Completely disabled. Cannot carry on any self-care. Totally confined to bed or  chair)  5 - Death   Eustace Pen MM, Creech RH, Tormey DC, et al. [phone]). "Toxicity and response criteria of the [facility]". [place] Oncol. 5 (6): 649-55  LABORATORY DATA:  Lab Results  Component Value Date   WBC 8.5 09/21/2017   HGB 12.5 09/21/2017   HCT 35.6 09/21/2017   MCV 98.3 09/21/2017   PLT 289 09/21/2017   NEUTROABS 6.3 09/21/2017   Lab Results  Component Value Date   NA 139 09/21/2017   K 2.2 (LL) 09/21/2017   CL 103 09/21/2017   CO2 22 09/21/2017   GLUCOSE 162 (H) 09/21/2017   CREATININE 1.14 (H) 09/21/2017   CALCIUM 9.5 09/21/2017      RADIOGRAPHY: Nm Pet Image Restag (ps) Skull Base To Thigh  Result Date: 08/30/2017 CLINICAL DATA:  Subsequent treatment strategy for metastatic breast cancer. EXAM: NUCLEAR MEDICINE PET SKULL BASE TO THIGH TECHNIQUE: 6.3 mCi F-18 FDG  was injected intravenously. Full-ring PET imaging was performed from the skull base to thigh after the radiotracer. CT data was obtained and used for attenuation correction and anatomic localization. Fasting blood glucose: 85 mg/dl COMPARISON:  04/30/2017. FINDINGS: Mediastinal blood pool activity: SUV max 2.4 NECK: No hypermetabolic lymph nodes. Incidental CT findings: None. CHEST: No hypermetabolic mediastinal, hilar or axillary lymph nodes. No hypermetabolic pulmonary nodules. Incidental CT findings: 1.9 cm low-attenuation right thyroid nodule has a more cystic appearance of measures slightly larger than on the prior exam, at which time it measured 1.5 cm. Left IJ Port-A-Cath terminates at the SVC RA junction. No pericardial or pleural effusion. ABDOMEN/PELVIS: No abnormal hypermetabolism in the liver, adrenal glands, spleen or pancreas. No hypermetabolic lymph nodes. Incidental CT findings: Liver, adrenal glands and right kidney are unremarkable. 1.1 cm low-attenuation lesion in the interpolar left kidney is too small to characterize. Spleen, pancreas, stomach and bowel are grossly unremarkable. SKELETON: New and increasingly hypermetabolic osseous lesions. For example, new hypermetabolic lesions are seen in the right glenoid and medial right iliac wing, with an SUV max 4.7 and 5.6, respectively. Focal hypermetabolism is seen in the central spinal canal the level of L1, similar. Uptake in the right anterior deltoid muscle has an SUV max of 5.0, stable. Incidental CT findings: Postoperative changes in proximal left femur. IMPRESSION: 1. Worsening osseous metastatic disease. 2. Focal hypermetabolism in the central spinal canal the L1 level, similar to the prior exam. Metastatic implant cannot be excluded. 3. Slight enlargement of a right thyroid nodule which now appears more cystic in character. Consider further evaluation with thyroid ultrasound. If patient is clinically hyperthyroid, consider nuclear medicine  thyroid uptake and scan. Electronically Signed   By: Lorin Picket M.D.   On: 08/30/2017 13:20      IMPRESSION: Metastatic breast cancer involving bone  Patient has significant activity on recent PET scan in the L5 lumbar spine, right  sacroiliac joint area and right pelvis region. She is also having significant pain along the right pelvis region consistent with her imaging studies  and would likely benefit from palliative radiation therapy directed at this region.  Today, I talked to the patient and husband about the findings and work-up thus far.  We discussed the natural history of breast cancer with osseous metastasis and general treatment, highlighting the role of radiotherapy in the management.  We discussed the available radiation techniques, and focused on the details of logistics and delivery.  We reviewed the anticipated acute and late sequelae associated with radiation in this setting.  The  patient was encouraged to ask questions that I answered to the best of my ability.  A patient consent form was discussed and signed.  We retained a copy for our records.  The patient would like to proceed with radiation and will be scheduled for CT simulation.   PLAN: She will undergo CT simulation tomorrow, 09/24/17, morning at 8 am. We will contact [employer] to ensure an interpreter for her tomorrow morning. Anticipate 9 treatments directed at the lower lumbar spine, right sacroiliac joint area, and right pelvis. She will complete her radiation treatments prior to  Her planned vacation.  She will be on vacation from 10/11/17 to 10/17/17. She has an appointment with Dr. Burr Medico on 10/20/17.     ------------------------------------------------  Blair Promise, PhD, MD  This document serves as a record of services personally performed by Gery Pray, MD. It was created on his behalf by Wilburn Mylar, a trained medical scribe. The creation of this record  is based on the scribe's personal observations and the provider's statements to them. This document has been checked and approved by the attending provider.

## 2017-09-24 ENCOUNTER — Ambulatory Visit
Admission: RE | Admit: 2017-09-24 | Discharge: 2017-09-24 | Disposition: A | Discharge status: Home / Self Care | Payer: Medicaid Other | Source: Ambulatory Visit | Attending: Radiation Oncology | Admitting: Radiation Oncology

## 2017-09-24 DIAGNOSIS — C7951 Secondary malignant neoplasm of bone: Secondary | ICD-10-CM | POA: Insufficient documentation

## 2017-09-24 DIAGNOSIS — Z510 Encounter for antineoplastic radiation therapy: Secondary | ICD-10-CM | POA: Diagnosis not present

## 2017-09-24 DIAGNOSIS — C50911 Malignant neoplasm of unspecified site of right female breast: Secondary | ICD-10-CM | POA: Insufficient documentation

## 2017-09-24 DIAGNOSIS — Z170 Estrogen receptor positive status [ER+]: Secondary | ICD-10-CM | POA: Insufficient documentation

## 2017-09-27 ENCOUNTER — Inpatient Hospital Stay: Payer: Medicaid Other

## 2017-09-27 ENCOUNTER — Telehealth: Payer: Self-pay

## 2017-09-27 ENCOUNTER — Other Ambulatory Visit: Payer: Self-pay

## 2017-09-27 ENCOUNTER — Telehealth: Payer: Self-pay | Admitting: Hematology

## 2017-09-27 DIAGNOSIS — C799 Secondary malignant neoplasm of unspecified site: Principal | ICD-10-CM

## 2017-09-27 DIAGNOSIS — Z170 Estrogen receptor positive status [ER+]: Secondary | ICD-10-CM | POA: Diagnosis not present

## 2017-09-27 DIAGNOSIS — Z95828 Presence of other vascular implants and grafts: Secondary | ICD-10-CM

## 2017-09-27 DIAGNOSIS — C50911 Malignant neoplasm of unspecified site of right female breast: Secondary | ICD-10-CM

## 2017-09-27 DIAGNOSIS — Z510 Encounter for antineoplastic radiation therapy: Secondary | ICD-10-CM | POA: Diagnosis not present

## 2017-09-27 DIAGNOSIS — C7951 Secondary malignant neoplasm of bone: Secondary | ICD-10-CM

## 2017-09-27 DIAGNOSIS — C50919 Malignant neoplasm of unspecified site of unspecified female breast: Secondary | ICD-10-CM

## 2017-09-27 LAB — CBC WITH DIFFERENTIAL/PLATELET
BASOS ABS: 0.1 10*3/uL (ref 0.0–0.1)
Basophils Relative: 1 %
Eosinophils Absolute: 0.3 10*3/uL (ref 0.0–0.5)
Eosinophils Relative: 6 %
HEMATOCRIT: 29.5 % — AB (ref 34.8–46.6)
HEMOGLOBIN: 10.3 g/dL — AB (ref 11.6–15.9)
Lymphocytes Relative: 30 %
Lymphs Abs: 1.6 10*3/uL (ref 0.9–3.3)
MCH: 34.1 pg — ABNORMAL HIGH (ref 25.1–34.0)
MCHC: 34.9 g/dL (ref 31.5–36.0)
MCV: 97.7 fL (ref 79.5–101.0)
MONOS PCT: 8 %
Monocytes Absolute: 0.4 10*3/uL (ref 0.1–0.9)
NEUTROS ABS: 2.9 10*3/uL (ref 1.5–6.5)
NEUTROS PCT: 55 %
Platelets: 204 10*3/uL (ref 145–400)
RBC: 3.02 MIL/uL — AB (ref 3.70–5.45)
RDW: 12.6 % (ref 11.2–14.5)
WBC: 5.3 10*3/uL (ref 3.9–10.3)

## 2017-09-27 LAB — COMPREHENSIVE METABOLIC PANEL
ALT: 9 U/L (ref 0–44)
ANION GAP: 10 (ref 5–15)
AST: 14 U/L — ABNORMAL LOW (ref 15–41)
Albumin: 2.9 g/dL — ABNORMAL LOW (ref 3.5–5.0)
Alkaline Phosphatase: 61 U/L (ref 38–126)
BUN: 6 mg/dL (ref 6–20)
CALCIUM: 8.7 mg/dL — AB (ref 8.9–10.3)
CO2: 28 mmol/L (ref 22–32)
Chloride: 107 mmol/L (ref 98–111)
Creatinine, Ser: 0.81 mg/dL (ref 0.44–1.00)
GFR calc non Af Amer: >60 mL/min (ref >60)
Glucose, Bld: 92 mg/dL (ref 70–99)
Potassium: 2.7 mmol/L — CL (ref 3.5–5.1)
Sodium: 145 mmol/L (ref 135–145)
TOTAL PROTEIN: 6 g/dL — AB (ref 6.5–8.1)
Total Bilirubin: 1.2 mg/dL (ref 0.3–1.2)

## 2017-09-27 MED ORDER — SODIUM CHLORIDE 0.9 % IJ SOLN
10.0000 mL | INTRAMUSCULAR | Status: DC | PRN
  Administered 2017-09-27: 10 mL via INTRAVENOUS
  Filled 2017-09-27: qty 10

## 2017-09-27 MED ORDER — HEPARIN SOD (PORK) LOCK FLUSH 100 UNIT/ML IV SOLN
500.0000 [IU] | Freq: Once | INTRAVENOUS | Status: AC | PRN
  Administered 2017-09-27: 500 [IU] via INTRAVENOUS
  Filled 2017-09-27: qty 5

## 2017-09-27 NOTE — Telephone Encounter (Signed)
This RN received notification for lab of KCl of 2.7. Cira Rue NP made aware. Spoke with patient and she stated that she has been taking 30 MeQ of KCl daily until this week and yesterday stared taking 20 MeQ daily. She stated that she has enough KCL tabs and does not need a refill at this time. Return call to patient and left a message to take 2 tablets BID of Potassium today tomorrow and Wednesday and then on Thursday and Friday to Take 3 tabs daily and we will also schedule a follow up lab appointment this Friday to recheck potassium levels and to call back if she has any questions. Message sent to scheduling for appointment for Friday for lab recheck.

## 2017-09-27 NOTE — Telephone Encounter (Signed)
Used interpreter line to LVM for pt regarding appt updates per 9/16 sch message

## 2017-09-27 NOTE — Patient Instructions (Signed)
Implanted Port Home Guide An implanted port is a type of central line that is placed under the skin. Central lines are used to provide IV access when treatment or nutrition needs to be given through a person's veins. Implanted ports are used for long-term IV access. An implanted port may be placed because:  You need IV medicine that would be irritating to the small veins in your hands or arms.  You need long-term IV medicines, such as antibiotics.  You need IV nutrition for a long period.  You need frequent blood draws for lab tests.  You need dialysis.  Implanted ports are usually placed in the chest area, but they can also be placed in the upper arm, the abdomen, or the leg. An implanted port has two main parts:  Reservoir. The reservoir is round and will appear as a small, raised area under your skin. The reservoir is the part where a needle is inserted to give medicines or draw blood.  Catheter. The catheter is a thin, flexible tube that extends from the reservoir. The catheter is placed into a large vein. Medicine that is inserted into the reservoir goes into the catheter and then into the vein.  How will I care for my incision site? Do not get the incision site wet. Bathe or shower as directed by your health care provider. How is my port accessed? Special steps must be taken to access the port:  Before the port is accessed, a numbing cream can be placed on the skin. This helps numb the skin over the port site.  Your health care provider uses a sterile technique to access the port. ? Your health care provider must put on a mask and sterile gloves. ? The skin over your port is cleaned carefully with an antiseptic and allowed to dry. ? The port is gently pinched between sterile gloves, and a needle is inserted into the port.  Only "non-coring" port needles should be used to access the port. Once the port is accessed, a blood return should be checked. This helps ensure that the port  is in the vein and is not clogged.  If your port needs to remain accessed for a constant infusion, a clear (transparent) bandage will be placed over the needle site. The bandage and needle will need to be changed every week, or as directed by your health care provider.  Keep the bandage covering the needle clean and dry. Do not get it wet. Follow your health care provider's instructions on how to take a shower or bath while the port is accessed.  If your port does not need to stay accessed, no bandage is needed over the port.  What is flushing? Flushing helps keep the port from getting clogged. Follow your health care provider's instructions on how and when to flush the port. Ports are usually flushed with saline solution or a medicine called heparin. The need for flushing will depend on how the port is used.  If the port is used for intermittent medicines or blood draws, the port will need to be flushed: ? After medicines have been given. ? After blood has been drawn. ? As part of routine maintenance.  If a constant infusion is running, the port may not need to be flushed.  How long will my port stay implanted? The port can stay in for as long as your health care provider thinks it is needed. When it is time for the port to come out, surgery will be   done to remove it. The procedure is similar to the one performed when the port was put in. When should I seek immediate medical care? When you have an implanted port, you should seek immediate medical care if:  You notice a bad smell coming from the incision site.  You have swelling, redness, or drainage at the incision site.  You have more swelling or pain at the port site or the surrounding area.  You have a fever that is not controlled with medicine.  This information is not intended to replace advice given to you by your health care provider. Make sure you discuss any questions you have with your health care provider. Document  Released: 12/29/2004 Document Revised: 06/06/2015 Document Reviewed: 09/05/2012 Elsevier Interactive Patient Education  2017 [employer]  

## 2017-09-28 ENCOUNTER — Ambulatory Visit
Admission: RE | Admit: 2017-09-28 | Discharge: 2017-09-28 | Disposition: A | Discharge status: Home / Self Care | Payer: Medicaid Other | Source: Ambulatory Visit | Attending: Radiation Oncology | Admitting: Radiation Oncology

## 2017-09-28 DIAGNOSIS — Z170 Estrogen receptor positive status [ER+]: Secondary | ICD-10-CM | POA: Diagnosis not present

## 2017-09-28 DIAGNOSIS — C50911 Malignant neoplasm of unspecified site of right female breast: Secondary | ICD-10-CM | POA: Diagnosis not present

## 2017-09-28 DIAGNOSIS — Z510 Encounter for antineoplastic radiation therapy: Secondary | ICD-10-CM | POA: Diagnosis not present

## 2017-09-28 DIAGNOSIS — C7951 Secondary malignant neoplasm of bone: Secondary | ICD-10-CM | POA: Diagnosis not present

## 2017-09-28 NOTE — Progress Notes (Addendum)
  Radiation Oncology         [phone] ________________________________  Name: [name] MRN: [id number]  Date: 09/24/2017  DOB: [birth year]  SIMULATION AND TREATMENT PLANNING NOTE    ICD-10-CM   1. [name], right ([place]) C50.911    C79.51     DIAGNOSIS:  [name]  NARRATIVE:  The patient was brought to the [place].  Identity was confirmed.  All relevant records and images related to the planned course of therapy were reviewed.  The patient freely provided informed written consent to proceed with treatment after reviewing the details related to the planned course of therapy. The consent form was witnessed and verified by the simulation staff.  Then, the patient was set-up in a stable reproducible  supine position for radiation therapy.  CT images were obtained.  Surface markings were placed.  The CT images were loaded into the planning software.  Then the target and avoidance structures were contoured.  Treatment planning then occurred.  The radiation prescription was entered and confirmed.  Then, I designed and supervised the construction of a total of 4 medically necessary complex treatment devices.  I have requested : 3D Simulation  I have requested a DVH of the following structures: skin, bowel and genitalia.  I have ordered:dose calc.  PLAN:  The patient will receive 27 Gy in 9 fractions directed at the areas of significant uptake noted on recent bone scan the right pelvis and right proximal femur.    -----------------------------------  Blair Promise, PhD, MD

## 2017-09-28 NOTE — Progress Notes (Signed)
  Radiation Oncology         [phone] ________________________________  Name: [name] MRN: [id number]  Date: 09/28/2017  DOB: 1965/10/30  Simulation Verification Note    ICD-10-CM   1. [name], right ([place]) C50.911    C79.51     Status: outpatient  NARRATIVE: The patient was brought to the treatment unit and placed in the planned treatment position. The clinical setup was verified. Then port films were obtained and uploaded to the radiation oncology medical record software.  The treatment beams were carefully compared against the planned radiation fields. The position location and shape of the radiation fields was reviewed. They targeted volume of tissue appears to be appropriately covered by the radiation beams. Organs at risk appear to be excluded as planned.  Based on my personal review, I approved the simulation verification. The patient's treatment will proceed as planned.  -----------------------------------  Blair Promise, PhD, MD

## 2017-09-29 ENCOUNTER — Ambulatory Visit
Admission: RE | Admit: 2017-09-29 | Discharge: 2017-09-29 | Disposition: A | Discharge status: Home / Self Care | Payer: Medicaid Other | Source: Ambulatory Visit | Attending: Radiation Oncology | Admitting: Radiation Oncology

## 2017-09-29 DIAGNOSIS — Z510 Encounter for antineoplastic radiation therapy: Secondary | ICD-10-CM | POA: Diagnosis not present

## 2017-09-29 DIAGNOSIS — C50911 Malignant neoplasm of unspecified site of right female breast: Secondary | ICD-10-CM | POA: Diagnosis not present

## 2017-09-29 DIAGNOSIS — C7951 Secondary malignant neoplasm of bone: Secondary | ICD-10-CM | POA: Diagnosis not present

## 2017-09-29 DIAGNOSIS — Z170 Estrogen receptor positive status [ER+]: Secondary | ICD-10-CM | POA: Diagnosis not present

## 2017-09-30 ENCOUNTER — Ambulatory Visit
Admission: RE | Admit: 2017-09-30 | Discharge: 2017-09-30 | Disposition: A | Discharge status: Home / Self Care | Payer: Medicaid Other | Source: Ambulatory Visit | Attending: Radiation Oncology | Admitting: Radiation Oncology

## 2017-09-30 DIAGNOSIS — C7951 Secondary malignant neoplasm of bone: Secondary | ICD-10-CM | POA: Diagnosis not present

## 2017-09-30 DIAGNOSIS — Z170 Estrogen receptor positive status [ER+]: Secondary | ICD-10-CM | POA: Diagnosis not present

## 2017-09-30 DIAGNOSIS — C50911 Malignant neoplasm of unspecified site of right female breast: Secondary | ICD-10-CM | POA: Diagnosis not present

## 2017-09-30 DIAGNOSIS — Z510 Encounter for antineoplastic radiation therapy: Secondary | ICD-10-CM | POA: Diagnosis not present

## 2017-10-01 ENCOUNTER — Other Ambulatory Visit: Payer: Medicaid Other

## 2017-10-01 ENCOUNTER — Inpatient Hospital Stay: Payer: Medicaid Other

## 2017-10-01 ENCOUNTER — Ambulatory Visit: Payer: Medicaid Other | Admitting: Nurse Practitioner

## 2017-10-01 ENCOUNTER — Ambulatory Visit: Payer: Medicaid Other

## 2017-10-04 ENCOUNTER — Ambulatory Visit
Admission: RE | Admit: 2017-10-04 | Discharge: 2017-10-04 | Disposition: A | Discharge status: Home / Self Care | Payer: Medicaid Other | Source: Ambulatory Visit | Attending: Radiation Oncology | Admitting: Radiation Oncology

## 2017-10-04 DIAGNOSIS — C50911 Malignant neoplasm of unspecified site of right female breast: Secondary | ICD-10-CM | POA: Diagnosis not present

## 2017-10-04 DIAGNOSIS — Z510 Encounter for antineoplastic radiation therapy: Secondary | ICD-10-CM | POA: Diagnosis not present

## 2017-10-04 DIAGNOSIS — Z170 Estrogen receptor positive status [ER+]: Secondary | ICD-10-CM | POA: Diagnosis not present

## 2017-10-04 DIAGNOSIS — C7951 Secondary malignant neoplasm of bone: Secondary | ICD-10-CM | POA: Diagnosis not present

## 2017-10-05 ENCOUNTER — Ambulatory Visit
Admission: RE | Admit: 2017-10-05 | Discharge: 2017-10-05 | Disposition: A | Discharge status: Home / Self Care | Payer: Medicaid Other | Source: Ambulatory Visit | Attending: Radiation Oncology | Admitting: Radiation Oncology

## 2017-10-05 DIAGNOSIS — C7951 Secondary malignant neoplasm of bone: Secondary | ICD-10-CM | POA: Diagnosis not present

## 2017-10-05 DIAGNOSIS — C50911 Malignant neoplasm of unspecified site of right female breast: Secondary | ICD-10-CM | POA: Diagnosis not present

## 2017-10-05 DIAGNOSIS — Z510 Encounter for antineoplastic radiation therapy: Secondary | ICD-10-CM | POA: Diagnosis not present

## 2017-10-05 DIAGNOSIS — Z170 Estrogen receptor positive status [ER+]: Secondary | ICD-10-CM | POA: Diagnosis not present

## 2017-10-06 ENCOUNTER — Telehealth: Payer: Self-pay | Admitting: Hematology

## 2017-10-06 ENCOUNTER — Ambulatory Visit
Admission: RE | Admit: 2017-10-06 | Discharge: 2017-10-06 | Disposition: A | Discharge status: Home / Self Care | Payer: Medicaid Other | Source: Ambulatory Visit | Attending: Radiation Oncology | Admitting: Radiation Oncology

## 2017-10-06 DIAGNOSIS — Z170 Estrogen receptor positive status [ER+]: Secondary | ICD-10-CM | POA: Diagnosis not present

## 2017-10-06 DIAGNOSIS — C7951 Secondary malignant neoplasm of bone: Secondary | ICD-10-CM | POA: Diagnosis not present

## 2017-10-06 DIAGNOSIS — C50911 Malignant neoplasm of unspecified site of right female breast: Secondary | ICD-10-CM | POA: Diagnosis not present

## 2017-10-06 DIAGNOSIS — Z510 Encounter for antineoplastic radiation therapy: Secondary | ICD-10-CM | POA: Diagnosis not present

## 2017-10-06 NOTE — Telephone Encounter (Signed)
Scheduled pt per 9/24 sch message - pt to get an updated schedule next visit.

## 2017-10-07 ENCOUNTER — Ambulatory Visit
Admission: RE | Admit: 2017-10-07 | Discharge: 2017-10-07 | Disposition: A | Discharge status: Home / Self Care | Payer: Medicaid Other | Source: Ambulatory Visit | Attending: Radiation Oncology | Admitting: Radiation Oncology

## 2017-10-07 DIAGNOSIS — Z170 Estrogen receptor positive status [ER+]: Secondary | ICD-10-CM | POA: Diagnosis not present

## 2017-10-07 DIAGNOSIS — Z510 Encounter for antineoplastic radiation therapy: Secondary | ICD-10-CM | POA: Diagnosis not present

## 2017-10-07 DIAGNOSIS — C7951 Secondary malignant neoplasm of bone: Secondary | ICD-10-CM | POA: Diagnosis not present

## 2017-10-07 DIAGNOSIS — C50911 Malignant neoplasm of unspecified site of right female breast: Secondary | ICD-10-CM | POA: Diagnosis not present

## 2017-10-08 ENCOUNTER — Ambulatory Visit
Admission: RE | Admit: 2017-10-08 | Discharge: 2017-10-08 | Disposition: A | Discharge status: Home / Self Care | Payer: Medicaid Other | Source: Ambulatory Visit | Attending: Radiation Oncology | Admitting: Radiation Oncology

## 2017-10-08 ENCOUNTER — Encounter: Payer: Self-pay | Admitting: Oncology

## 2017-10-08 ENCOUNTER — Inpatient Hospital Stay (HOSPITAL_BASED_OUTPATIENT_CLINIC_OR_DEPARTMENT_OTHER): Payer: Medicaid Other | Admitting: Oncology

## 2017-10-08 ENCOUNTER — Inpatient Hospital Stay: Payer: Medicaid Other

## 2017-10-08 VITALS — BP 130/69 | HR 56 | Temp 97.5°F | Resp 18 | Ht 67.0 in | Wt 127.8 lb

## 2017-10-08 DIAGNOSIS — K5900 Constipation, unspecified: Secondary | ICD-10-CM

## 2017-10-08 DIAGNOSIS — Z79899 Other long term (current) drug therapy: Secondary | ICD-10-CM | POA: Diagnosis not present

## 2017-10-08 DIAGNOSIS — D61818 Other pancytopenia: Secondary | ICD-10-CM | POA: Diagnosis not present

## 2017-10-08 DIAGNOSIS — H905 Unspecified sensorineural hearing loss: Secondary | ICD-10-CM

## 2017-10-08 DIAGNOSIS — Z170 Estrogen receptor positive status [ER+]: Secondary | ICD-10-CM

## 2017-10-08 DIAGNOSIS — Z79811 Long term (current) use of aromatase inhibitors: Secondary | ICD-10-CM | POA: Diagnosis not present

## 2017-10-08 DIAGNOSIS — D649 Anemia, unspecified: Secondary | ICD-10-CM | POA: Diagnosis not present

## 2017-10-08 DIAGNOSIS — C7951 Secondary malignant neoplasm of bone: Secondary | ICD-10-CM

## 2017-10-08 DIAGNOSIS — C50919 Malignant neoplasm of unspecified site of unspecified female breast: Secondary | ICD-10-CM

## 2017-10-08 DIAGNOSIS — Z510 Encounter for antineoplastic radiation therapy: Secondary | ICD-10-CM | POA: Diagnosis not present

## 2017-10-08 DIAGNOSIS — C799 Secondary malignant neoplasm of unspecified site: Principal | ICD-10-CM

## 2017-10-08 DIAGNOSIS — Z8614 Personal history of Methicillin resistant Staphylococcus aureus infection: Secondary | ICD-10-CM

## 2017-10-08 DIAGNOSIS — C50911 Malignant neoplasm of unspecified site of right female breast: Secondary | ICD-10-CM | POA: Diagnosis not present

## 2017-10-08 DIAGNOSIS — Z95828 Presence of other vascular implants and grafts: Secondary | ICD-10-CM

## 2017-10-08 DIAGNOSIS — E876 Hypokalemia: Secondary | ICD-10-CM | POA: Diagnosis not present

## 2017-10-08 LAB — COMPREHENSIVE METABOLIC PANEL
ALBUMIN: 3.4 g/dL — AB (ref 3.5–5.0)
ALK PHOS: 60 U/L (ref 38–126)
ALT: 13 U/L (ref 0–44)
ANION GAP: 10 (ref 5–15)
AST: 15 U/L (ref 15–41)
BILIRUBIN TOTAL: 1.8 mg/dL — AB (ref 0.3–1.2)
BUN: 10 mg/dL (ref 6–20)
CO2: 25 mmol/L (ref 22–32)
Calcium: 9.3 mg/dL (ref 8.9–10.3)
Chloride: 106 mmol/L (ref 98–111)
Creatinine, Ser: 0.78 mg/dL (ref 0.44–1.00)
GFR calc Af Amer: >60 mL/min (ref >60)
GFR calc non Af Amer: >60 mL/min (ref >60)
GLUCOSE: 94 mg/dL (ref 70–99)
Potassium: 3.8 mmol/L (ref 3.5–5.1)
Sodium: 141 mmol/L (ref 135–145)
TOTAL PROTEIN: 6.7 g/dL (ref 6.5–8.1)

## 2017-10-08 LAB — CBC WITH DIFFERENTIAL/PLATELET
BASOS ABS: 0 10*3/uL (ref 0.0–0.1)
BASOS PCT: 1 %
Eosinophils Absolute: 0.1 10*3/uL (ref 0.0–0.5)
Eosinophils Relative: 5 %
HCT: 32.7 % — ABNORMAL LOW (ref 34.8–46.6)
Hemoglobin: 11.5 g/dL — ABNORMAL LOW (ref 11.6–15.9)
Lymphocytes Relative: 20 %
Lymphs Abs: 0.6 10*3/uL — ABNORMAL LOW (ref 0.9–3.3)
MCH: 34.6 pg — ABNORMAL HIGH (ref 25.1–34.0)
MCHC: 35.3 g/dL (ref 31.5–36.0)
MCV: 98 fL (ref 79.5–101.0)
MONO ABS: 0.3 10*3/uL (ref 0.1–0.9)
Monocytes Relative: 10 %
NEUTROS ABS: 1.8 10*3/uL (ref 1.5–6.5)
Neutrophils Relative %: 64 %
Platelets: 177 10*3/uL (ref 145–400)
RBC: 3.34 MIL/uL — ABNORMAL LOW (ref 3.70–5.45)
RDW: 12.6 % (ref 11.2–14.5)
WBC: 2.8 10*3/uL — ABNORMAL LOW (ref 3.9–10.3)

## 2017-10-08 MED ORDER — EXEMESTANE 25 MG PO TABS: 25.0000 mg | ORAL_TABLET | Freq: Every day | ORAL | 1 refills | Status: DC

## 2017-10-08 MED ORDER — SODIUM CHLORIDE 0.9 % IJ SOLN
10.0000 mL | INTRAMUSCULAR | Status: DC | PRN
  Administered 2017-10-08: 10 mL via INTRAVENOUS
  Filled 2017-10-08: qty 10

## 2017-10-08 MED ORDER — POTASSIUM CHLORIDE ER 10 MEQ PO TBCR: 10.0000 meq | EXTENDED_RELEASE_TABLET | Freq: Two times a day (BID) | ORAL | 0 refills | Status: DC

## 2017-10-08 MED ORDER — HEPARIN SOD (PORK) LOCK FLUSH 100 UNIT/ML IV SOLN
500.0000 [IU] | Freq: Once | INTRAVENOUS | Status: AC | PRN
  Administered 2017-10-08: 500 [IU] via INTRAVENOUS
  Filled 2017-10-08: qty 5

## 2017-10-08 NOTE — Progress Notes (Signed)
[facility] OFFICE PROGRESS NOTE  [name], [name], [name] [address] 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    [name], right ([place])   11/1997 Cancer Diagnosis    Patient had 1.4 cm poorly differentiated right breast carcinoma diagnosed in Nov 1999 at age 52, 1/7 axillary nodes involved, ER/PR and HER 2 positive. She had mastectomy with the 7 axillary node evaluation, 4 cycles of adriamycin/ cytoxan followed by taxotere, then five years of tamoxifen thru June 2005 (no herceptin in 1999). She had bilateral oophorectomy in May 2005. She was briefly on aromatase inhibitor after tamoxifen, but discontinued this herself due to poor tolerance.    04/04/2015 Genetic Testing    Negative genetic testing on the breast/ovarian cancer pnael.  The Breast/Ovarian gene panel offered by [facility] includes sequencing and rearrangement analysis for the following 20 genes:  ATM, BARD1, BRCA1, BRCA2, BRIP1, CDH1, CHEK2, EPCAM, FANCC, MLH1, MSH2, MSH6, NBN, PALB2, PMS2, PTEN, RAD51C, RAD51D, TP53, and XRCC2.    06/26/2015 Pathology Results    Initial Path Report Bone, curettage, Left femur met, intramedullary subtroch tissue - METASTATIC ADENOCARCINOMA.  Estrogen Receptor: 95%, POSITIVE, STRONG STAINING INTENSITY Progesterone Receptor: 2%, POSITIVE, STRONG  HER2 (+), IHC 3+    06/26/2015 Surgery    Biopsy of metastatic tissue and stabilization with Affixus trochanteric nail, proximal and distal interlock of left femur by Dr. [name]     06/28/2015 Imaging    CT Chest w/ Contrast 1. Extensive right internal mammary chain lymphadenopathy consistent with metastatic breast cancer. 2. Lytic metastases involving the posterior elements at T1 with probable nondisplaced pathologic fracture of the spinous process. 3. No other evidence of thoracic metastatic disease. 4. Trace bilateral  pleural effusions with associated bibasilar atelectasis. 5. Indeterminate right thyroid nodule, not previously imaged. This could be evaluated with thyroid ultrasound as clinically warranted.    07/01/2015 Imaging    Bone Scan 1. Increased activity noted throughout the left femur. Although metastatic disease cannot be excluded, these changes are most likely secondary to prior surgery. 2. No other focal abnormalities identified to suggest metastatic disease.    07/12/2015 - 07/26/2015 Radiation Therapy    Left femur, 30 Gy in 10 fractions by Dr. Sondra Come     07/14/2015 Initial Diagnosis    [name], right ([place])    07/19/2015 Imaging    Initial PET Scan 1. Severe multifocal osseous metastatic disease, much greater than anticipated based on prior imaging. 2. Multifocal nodal metastases involving the right internal mammary, prevascular and subpectoral lymph nodes. No axillary pulmonary involvement identified. 3. No distant extra osseous metastases.    07/30/2015 - 11/29/2015 Chemotherapy    Docetaxel, Herceptin and Perjeta every 3 weeks X6 cycles, pt tolerated moderately well, restaging scan showed excellent response     09/18/2015 Imaging    CT Head w/wo Contrast 1. No acute intracranial abnormality or significant interval change. 2. Stable minimal periventricular white matter hypoattenuation on the right. This may reflect a remote ischemic injury. 3. No evidence for metastatic disease to the brain. 4. No focal soft tissue lesion to explain the patient's tenderness.    10/30/2015 Imaging    CT Abdomen Pelvis w/ Contrast 1. Few mildly prominent fluid-filled loops of small bowel scattered throughout the abdomen, with intraluminal fluid density within the distal colon. Given the provided history, findings are suggestive of acute enteritis/diarrheal illness. 2. No  other acute intra-abdominal or pelvic process identified. 3. Widespread osseous metastatic disease, better  evaluated on most recent PET-CT from 07/19/2015. No pathologic fracture or other complication. 4. 11 mm hypodensity within the left kidney. This lesion measures intermediate density, and is indeterminate. While this lesion is similar in size relative to recent studies, this is increased in size relative to prior study from 2012. Further evaluation with dedicated renal mass protocol CT and/or MRI is recommended for complete characterization.    12/2015 - 05/14/2016 Chemotherapy    Maintenance Herceptin and pejeta every 3 weeks stopped on 05/14/16 due to disease progression    12/25/2015 Imaging    Restaging PET Scan 1. Markedly improved skeletal activity, with only several faint foci of residual accentuated metabolic activity, but resolution of the vast majority of the previously extensive osseous metastatic disease. 2. Resolution of the prior right internal mammary and right prevascular lymph nodes. 3. Residual subcutaneous edema and edema along fascia planes in the left upper thigh. This remains somewhat more than I would expect for placement of an IM nail 6 weeks ago. If there is leg swelling further down, consider Doppler venous ultrasound to rule out left lower extremity DVT. I do not perceive an obvious difference in density between the pelvic and common femoral veins on the noncontrast CT data. 4. Chronic right maxillary and right sphenoid sinusitis.    01/16/2016 -  Anti-estrogen oral therapy    Letrozole 2.5 mg daily. She was switched to exemestane due to disease progression on 02/08/17.    03/01/2016 Miscellaneous    Patient presented to ED following a fall; she reports she landed on her back and is now experiencing back pain. The patient was evaluated and discharge home same day with pain control.    05/01/2016 Imaging    CT CAP IMPRESSION: 1. Stable exam.  No new or progressive disease identified. 2. No evidence for residual or recurrent adenopathy within the chest. 3. Stable bone  metastasis.    05/01/2016 Imaging    BONE SCAN IMPRESSION: 1. Small focal uptake involving the anterior rib ends of the left second and right fourth ribs, new since the prior bone scan. The location of this uptake is more suggestive of a traumatic or inflammatory etiology as opposed to metastatic disease. No other evidence of metastatic disease. 2. There are other areas of stable uptake which are most likely degenerative/reactive, including right shoulder and cervical spine uptake and uptake along the right femur adjacent to the ORIF hardware.    05/20/2016 Imaging    NM PET Skull to Thigh IMPRESSION: 1. There multiple metastatic foci in the bony pelvis, including some new abnormal foci compare to the prior PET-CT, compatible with mildly progressive bony metastatic disease. Also, a left T11 vertebral hypermetabolic metastatic lesion is observed, new compared to the prior exam. 2. No active extraosseous malignancy is currently identified. 3. Right anterior fourth rib healing fracture, appears be    06/04/2016 - 01/08/2017 Chemotherapy    Second line chemotherapy Kadcyla every 3 weeks started on 06/04/16 and stopped on 01/08/17 due to mixed response.      10/05/2016 Imaging    CT CAP and Bone Scan:  Bones: left intramedullary rod and left femoral head neck screw in place. In the proximal diaphysis of the left femur there is cortical thickening and irregularity. No lytic or blastic bone lesions. No fracture or vertebral endplate destruction.   No definite evidence of recurrent disease in the chest, abdomen, or pelvis.  10/28/2016 Imaging    CT A/P IMPRESSION: No acute process demonstrated in the abdomen or pelvis. No evidence of bowel obstruction or inflammation.    11/04/2016 Imaging    DG foot complete left: IMPRESSION: No fracture or dislocation.  No soft tissue abnormality    11/23/2016 Imaging    CT RIGHT FEMUR IMPRESSION: Negative CT scan of the right thigh.  No  visible metastatic disease.  CT LEFT FEMUR IMPRESSION: 1. Postsurgical changes related to prior cephalomedullary rod fixation of the left femur with linear lucency along the anterolateral proximal femoral cortex at level of the lesser trochanter, suspicious for nondisplaced fracture. 2. Slightly permeative appearance of the proximal left femoral cortex at the site of known prior osseous metastases is largely unchanged. 3. Unchanged patchy lucency and sclerosis along the anterior acetabulum, which appears to correspond with an area of faint uptake on the prior PET-CT, suspicious for osseous metastasis. These results will be called to the ordering clinician or representative by the Radiologist Assistant, and communication documented in the PACS or zVision Dashboard.      01/27/2017 Imaging    IMPRESSION: 1. Mixed response to osseous metastasis. Some hypermetabolic lesions are new and increasingly hypermetabolic, while another index lesion is decreasingly hypermetabolic. 2. No evidence of hypermetabolic soft tissue metastasis.     02/08/2017 -  Antibody Plan    -Exemestane 25 mg daily since 02/08/17 -Herceptin every 3 week since 02/08/17 -Oral Lanpantinib '1500mg'$  and decreased to '500mg'$  due to diarrhea and depression. Due to disease progression she swithced to Verzenio '150mg'$  on 05/04/17. She did not toelrate well so she was switched to Ibrance '100mg'$  on 05/31/17. Due to her neutropenia, Ibrance dose reduced to 75 mg daily, 3 weeks on, one-week off.     04/30/2017 PET scan    IMPRESSION: 1. Primarily increase in hypermetabolism of osseous metastasis. An isolated right acetabular lesion is decreased in hypermetabolism since the prior exam. 2. No evidence of hypermetabolic soft tissue metastasis. 3. Similar non FDG avid right-sided thyroid nodule, favoring a benign etiology. Recommend attention on follow-up.    08/30/2017 PET scan    08/30/2017 PET Scan  IMPRESSION: 1. Worsening osseous  metastatic disease. 2. Focal hypermetabolism in the central spinal canal the L1 level, similar to the prior exam. Metastatic implant cannot be excluded. 3. Slight enlargement of a right thyroid nodule which now appears more cystic in character. Consider further evaluation with thyroid ultrasound. If patient is clinically hyperthyroid, consider nuclear medicine thyroid uptake and scan    08/30/2017 Progression    08/30/2017 PET Scan  IMPRESSION: 1. Worsening osseous metastatic disease. 2. Focal hypermetabolism in the central spinal canal the L1 level, similar to the prior exam. Metastatic implant cannot be excluded. 3. Slight enlargement of a right thyroid nodule which now appears more cystic in character. Consider further evaluation with thyroid ultrasound. If patient is clinically hyperthyroid, consider nuclear medicine thyroid uptake and scan    10/19/2017 -  Chemotherapy    The patient had trastuzumab (HERCEPTIN) 441 mg in sodium chloride 0.9 % 250 mL chemo infusion, 8 mg/kg, Intravenous,  Once, 0 of 6 cycles PACLitaxel (TAXOL) 132 mg in sodium chloride 0.9 % 250 mL chemo infusion (</= '80mg'$ /m2), 80 mg/m2, Intravenous,  Once, 0 of 6 cycles pertuzumab (PERJETA) 840 mg in sodium chloride 0.9 % 250 mL chemo infusion, 840 mg, Intravenous, Once, 0 of 6 cycles  for chemotherapy treatment.     INTERVAL HISTORY: [name] 52 y.o. female returns for routine follow-up  visit accompanied by her husband and interpreter.  The patient is feeling better since stopping her treatment with neratinib.  She is eating better and getting back some of her lost weight.  She no longer has any nausea, vomiting, or diarrhea.  She is receiving radiation to her lower lumbar spine, right sacroiliac joint area, and right pelvis.  She is due to complete this on 10/11/2017.  She reports that her pain is better controlled.  She is not taking much pain medication at this point in time.  She denies fevers and chills.   Denies chest pain, shortness of breath, cough, hemoptysis.  The patient is here for evaluation and repeat lab work.  MEDICAL HISTORY: Past Medical History:  Diagnosis Date  . Abnormal Pap smear   . Anxiety   . Breast cancer ([place])   . Cancer ([place]) 1999   breast-s/p mastectomy, chemo, rad  . CIN I (cervical intraepithelial neoplasia I) 2003   by colpo  . Congenital deafness   . Deaf   . Depression   . Radiation 07/12/15-07/26/15   left femur 30 Gy  . S/P bilateral oophorectomy     ALLERGIES:  is allergic to chlorhexidine; promethazine hcl; and diphenhydramine hcl.  MEDICATIONS:  Current Outpatient Medications  Medication Sig Dispense Refill  . Budesonide ER 9 MG TB24 Take 9 mg by mouth daily. Take with food early in the day. 30 tablet 2  . Cholecalciferol 2000 units CAPS Take 1 capsule by mouth daily.    . diphenoxylate-atropine (LOMOTIL) 2.5-0.025 MG tablet Take 1-2 tablets by mouth 4 (four) times daily as needed for diarrhea or loose stools. 30 tablet 0  . divalproex (DEPAKOTE) 250 MG DR tablet Take 1 tablet (250 mg total) by mouth at bedtime. 30 tablet 2  . exemestane (AROMASIN) 25 MG tablet Take 1 tablet (25 mg total) by mouth daily after breakfast. 30 tablet 1  . HYDROcodone-acetaminophen (NORCO) 10-325 MG tablet Take 1 tablet by mouth every 6 (six) hours as needed. 45 tablet 0  . lidocaine-prilocaine (EMLA) cream Apply 1 application topically as needed. Apply to Porta-Cath 1-2 hours prior to access as directed. 30 g 2  . loperamide (IMODIUM) 2 MG capsule Take 2 capsules (4 mg total) by mouth 3 (three) times daily. May take additional tablets daily to titrate to 1-2 bowel movements each day 200 capsule 2  . potassium chloride (K-DUR) 10 MEQ tablet Take 10 mEq by mouth daily. Take 2 tablets daily.    . prochlorperazine (COMPAZINE) 10 MG tablet Take 1 tablet (10 mg total) by mouth every 6 (six) hours as needed for nausea or vomiting. 30 tablet 2   No current facility-administered  medications for this visit.    Facility-Administered Medications Ordered in Other Visits  Medication Dose Route Frequency Provider Last Rate Last Dose  . sodium chloride 0.9 % 1,000 mL with potassium chloride 10 mEq infusion   Intravenous Continuous Gordy Levan, MD   Stopped at 09/10/15 1653  . sodium chloride 0.9 % injection 10 mL  10 mL Intravenous PRN Livesay, Tamala Julian, MD        SURGICAL HISTORY:  Past Surgical History:  Procedure Laterality Date  . ABDOMINAL HYSTERECTOMY    . INTRAMEDULLARY (IM) NAIL INTERTROCHANTERIC Left 06/26/2015   Procedure: LEFT BIOMET LONG AFFIXS NAIL;  Surgeon: [name], MD;  Location: [facility];  Service: Orthopedics;  Laterality: Left;  . IR GENERIC HISTORICAL  08/06/2015   IR US GUIDE VASC ACCESS LEFT 08/06/2015 Eulas Post  Kathlene Cote, MD [facility]INTER[facility]  . IR GENERIC HISTORICAL  08/06/2015   IR FLUORO GUIDE CV LINE LEFT 08/06/2015 Aletta Edouard, MD [facility]  . IR GENERIC HISTORICAL  03/05/2016   IR CV LINE INJECTION 03/05/2016 Markus Daft, MD [facility]  . MASTECTOMY     right breast  . MASTECTOMY    . OVARIAN CYST REMOVAL    . RADIOLOGY WITH ANESTHESIA Left 06/25/2015   Procedure: MRI OF LEFT HIP WITH OR WITHOUT CONTRAST;  Surgeon: Medication Radiologist, MD;  Location: [facility];  Service: Radiology;  Laterality: Left;  DR. MCINTYRE/MRI  . TUBAL LIGATION      REVIEW OF SYSTEMS:   Review of Systems  Constitutional: Negative for appetite change, chills, fatigue, fever and unexpected weight change.  HENT:   Negative for mouth sores, nosebleeds, sore throat and trouble swallowing.   Eyes: Negative for eye problems and icterus.  Respiratory: Negative for cough, hemoptysis, shortness of breath and wheezing.   Cardiovascular: Negative for chest pain and leg swelling.  Gastrointestinal: Negative for abdominal pain, constipation, diarrhea, nausea and vomiting.  Genitourinary: Negative for bladder incontinence, difficulty urinating, dysuria, frequency and  hematuria.   Musculoskeletal: Back pain controlled with pain medication as needed. Skin: Negative for itching and rash.  Neurological: Negative for dizziness, extremity weakness, gait problem, headaches, light-headedness and seizures.  Hematological: Negative for adenopathy. Does not bruise/bleed easily.  Psychiatric/Behavioral: Negative for confusion, depression and sleep disturbance. The patient is not nervous/anxious.     PHYSICAL EXAMINATION:  Blood pressure 130/69, pulse (!) 56, temperature (!) 97.5 F (36.4 C), temperature source Oral, resp. rate 18, height _0  (1.702 m), weight 127 lb 12.8 oz (58 kg), SpO2 100 %.  ECOG PERFORMANCE STATUS: 1 - Symptomatic but completely ambulatory  Physical Exam  Constitutional: Oriented to person, place, and time and well-developed, well-nourished, and in no distress. No distress.  HENT:  Head: Normocephalic and atraumatic.  Mouth/Throat: Oropharynx is clear and moist. No oropharyngeal exudate.  Eyes: Conjunctivae are normal. Right eye exhibits no discharge. Left eye exhibits no discharge. No scleral icterus.  Neck: Normal range of motion. Neck supple.  Cardiovascular: Normal rate, regular rhythm, normal heart sounds and intact distal pulses.   Pulmonary/Chest: Effort normal and breath sounds normal. No respiratory distress. No wheezes. No rales.  Abdominal: Soft. Bowel sounds are normal. Exhibits no distension and no mass. There is no tenderness.  Musculoskeletal: Normal range of motion. Exhibits no edema.  Lymphadenopathy:    No cervical adenopathy.  Neurological: Alert and oriented to person, place, and time. Exhibits normal muscle tone. Gait normal. Coordination normal.  Skin: Skin is warm and dry. No rash noted. Not diaphoretic. No erythema. No pallor.  Psychiatric: Mood, memory and judgment normal.  Vitals reviewed.  LABORATORY DATA: Lab Results  Component Value Date   WBC 2.8 (L) 10/08/2017   HGB 11.5 (L) 10/08/2017   HCT 32.7 (L)  10/08/2017   MCV 98.0 10/08/2017   PLT 177 10/08/2017      Chemistry      Component Value Date/Time   NA 141 10/08/2017 0807   NA 141 01/08/2017 0910   K 3.8 10/08/2017 0807   K 3.3 (L) 01/08/2017 0910   CL 106 10/08/2017 0807   CL 106 05/31/2012 1055   CO2 25 10/08/2017 0807   CO2 26 01/08/2017 0910   BUN 10 10/08/2017 0807   BUN 10.1 01/08/2017 0910   CREATININE 0.78 10/08/2017 0807   CREATININE 0.9 01/08/2017 0910  Component Value Date/Time   CALCIUM 9.3 10/08/2017 0807   CALCIUM 9.2 01/08/2017 0910   ALKPHOS 60 10/08/2017 0807   ALKPHOS 64 01/08/2017 0910   AST 15 10/08/2017 0807   AST 21 01/08/2017 0910   ALT 13 10/08/2017 0807   ALT 17 01/08/2017 0910   BILITOT 1.8 (H) 10/08/2017 0807   BILITOT 0.95 01/08/2017 0910       RADIOGRAPHIC STUDIES:  No results found.   ASSESSMENT/PLAN:  Mrs. Moree is a 52 y.o. postmenopause female with:  1. 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 -Restaging PET 12-25-15 markedly improved of bone metastasis after initial systemic chemo  -she was switched to letrozole and maintenance Herceptin and the perjeta on 01/16/16 which stopped on 05/14/16.  -Did not tolerate SQ xgeva and it was changed to zometa in 06/2015 -Continue monitoring ECHO every 3 months when she is on herceptin -I previously reviewed her CT scan and bone scan from 05/01/2016, which showed stable disease overall, a few new uptake of anterior ribs on bone scan are indeterminate. -I previously discussed her restaging PET scan from 05/20/2016. Unfortunately it showed mild disease progression of her bone metastasis  -I have changed her treatment to second line Kadcyla and letrozole on 06/04/16, she is tolerating very well. She declined other chemo.  -We previously received records from [facility] in [place], [place].  She underwent CT CAP w contrast on 10/02/16 and bone scan on 09/21/16. CT shows no evidence of recurrent disease in the chest, abdomen, or pelvis; Bone scan indicates no lytic or blastic bone lesions.  -She has been on Zometa every 4-6 weeks since June 2017. We previously changed to every 3 months in 12/2016. 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  No other visceral organ metastasis. She is clinically doing well.  -Kadcyla was switched to Exemestane, oral HER2 antibody treatment lapatinib and herceptin starting 02/08/17. -She tried lapatinib, could not tolerate and stopped. -Her PET Scan from 04/30/17 revealed a primarily increase in hypermetabolism of osseous metastasis. There are no new lesions.  I reviewed the images and compared to the previous images in person with patient and her husband. I suspect her progression is due to her not being able to fully tolerate lapatinib -I previously discussed options with clinical trial for the new Her2 antibody conjugate Trastuzumab deruxtecan, the phase 3 clinical trial has been open, and the closest site is in [place] and [employer]. She is not interested in going to [place] at this time. -She did not respond to CDK4/6 inhibitor -Her 08/30/2017 PET Scan showed disease progression in her bones, no other new visceral metastasis. I reviewed her PET images with her in person. She denies hip or pelvic pain and has stable right shoulder and left hip pain from before.  -Will stop Ibrance due to disease progression, and started her on Neratinib -She tolerated neratinib very poorly, with diarrhea, nausea, vomiting, taste change and poor appetite.  Will stop today. -Discussed with the patient and her husband today that we will be changing her  chemotherapy to Taxol weekly starting when she returns from vacation, 10/9. Advised that the patient continue to take her anti-emetic  PRN.  --Chemotherapy consent: Side effects including but does not not limited to, fatigue, nausea, vomiting, diarrhea, hair loss, neuropathy, fluid retention, renal and kidney dysfunction, neutropenic fever, needed for blood transfusion, bleeding, were discussed with patient in great detail. She agrees to proceed. -The goal of therapy is palliative -Discussed that she will continue IV chemo for a minimum of 3 months and maximum 5-6 months with a break afterwards.  -Due to her new low back pain, which is likely related to her extensive pelvic metastasis, I will refer her to Dr. Dorathy Daft for palliative radiation -Due to patient upcoming vacation, she will receive next dose Herceptin on 10/9.  -I discussed that I will also add Perjeta to her regimen, which the patient has had in the past and tolerated well. This will start on 10/9.  -Discussed with the patient that her nausea and her taste change should recover within the next week.  -I reviewed the patient's labs with her today.  Hemoglobin is improving.  Total white count is low likely due to radiation to the pelvic area.  ANC remains normal. -Weekly taxol X3 starting 10/9 -Herceptin and Perjeta on 10/9   2. Right lower back pain, tailbone pain -Due to the patient's right lower back pain/tailbone pain, she is currently receiving palliative radiation to this area. -Pain improving.  Using hydrocodone on an as-needed basis.  3. History of MRSA skin infection scalp, boil at low back scalp and upper lip -I prescribed doxycycline on 04/24/16 for 7 days  -Resolved now.  4. Congenital deafness -needs sign language interpreter for all visits.  -Husband also deaf.  5. premature surgical menopause -post BSO 207fr pelvic pain and ovarian cysts, likely was also beneficial from standpoint of the breast  cancer. -hysterectomy   6. Intermittent anemia  -mild, possible related to prior chemo -She is asymptomatic, we will continue close monitoring  7. Hypokalemia  -She has had intermittent hypokalemia -Potassium was 3.8 today. She is currently taking potassium chloride 10 mEq twice a day.  She will continue this dose.  8. Goal of care discussion  -We previously discussed the incurable nature of her cancer, and the overall poor prognosis, especially if she does not have good response to chemotherapy or progress on chemo.  -The patient understands the goal of care is palliative. -she is full code for now   9. Bilateral Leg Pain -She initially presented with worsening left leg and hip pain secondary to bone mets, she had surgery before, this was much improved after her prior chemo -[place] every 6 hours as needed for pain. Which helped. -I previously encouraged her to follow up with Dr. YLorin Mercyabout the pain in her left leg. 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  Continue hydrocodone as needed.  She may use Tylenol instead of hydrocodone for mild pain.  10. Insomnia, Depression, Stress  -I previously gave her Ambien '5mg'$ , but it did not work well.  -continue ativan 1 mg to take at night as needed  -mirtazapine did not work, she also tried trazodone from PCP which gave her a rash. She stopped taking it.  -After the death of her dog she has been stressed and sad from how it happened.  -I previously offered her the chance to speak with our counselor or a therapist, she declined at this time. She denied  inclinations of self harm.  -I also previously offered her the choice of antidepressants. She will try coping without medication for now. I previously encouraged her to speak with family or friends about her feelings.    11.  Hyperbilirubinemia  -She has developed mild hyperbilirubinemia recently, TBIL 1.2-2.7, okay to continue treatment  -increased to 2.8 on 08/31/2017 -Her total bilirubin is 1.8 which is overall stable.  12. Mild dyspnea  -Her 08/04/17 ECHO shows mostly adequate heart function -If her breathing worsens I previously encouraged her to contact the clinic  - will continue monitoring   13. Diarrhea -Diarrhea has resolved since stopping neratinib.   Plan:  -Continue to radiation which is scheduled to be completed on 10/11/2017. -Weekly taxol X3 starting 10/9 -Herceptin and Perjeta on 10/9  -Continue potassium chloride 10 mEq twice a day. -Follow-up as previously scheduled on 10/20/2017 prior to her first cycle of Taxol given along with Herceptin and Perjeta.   Pt had many questions. All questions were answered. The patient knows to call the clinic with any problems, questions or concerns.  No orders of the defined types were placed in this encounter.    Mikey Bussing, DNP, AGPCNP-BC, AOCNP 10/08/17

## 2017-10-09 LAB — CANCER ANTIGEN 27.29: CA 27.29: 56 U/mL — ABNORMAL HIGH (ref 0.0–38.6)

## 2017-10-11 ENCOUNTER — Ambulatory Visit
Admission: RE | Admit: 2017-10-11 | Discharge: 2017-10-11 | Disposition: A | Discharge status: Home / Self Care | Payer: Medicaid Other | Source: Ambulatory Visit | Attending: Radiation Oncology | Admitting: Radiation Oncology

## 2017-10-11 DIAGNOSIS — C50911 Malignant neoplasm of unspecified site of right female breast: Secondary | ICD-10-CM | POA: Diagnosis not present

## 2017-10-11 DIAGNOSIS — Z510 Encounter for antineoplastic radiation therapy: Secondary | ICD-10-CM | POA: Diagnosis not present

## 2017-10-11 DIAGNOSIS — C7951 Secondary malignant neoplasm of bone: Secondary | ICD-10-CM | POA: Diagnosis not present

## 2017-10-11 DIAGNOSIS — Z170 Estrogen receptor positive status [ER+]: Secondary | ICD-10-CM | POA: Diagnosis not present

## 2017-10-12 NOTE — Progress Notes (Signed)
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 She had mastectomy with the 7 axillary node evaluation, 4 cycles of adriamycin/ cytoxan followed by taxotere, then five years of tamoxifen thru June 2005 (no herceptin in 1999). She had bilateral oophorectomy in May 2005. She was briefly on aromatase inhibitor after tamoxifen, but discontinued this herself due to poor tolerance.    04/04/2015 Genetic Testing    Negative genetic testing on the breast/ovarian cancer pnael.  The Breast/Ovarian gene panel offered by [facility] includes sequencing and rearrangement analysis for the following 20 genes:  ATM, BARD1, BRCA1, BRCA2, BRIP1, CDH1, CHEK2, EPCAM, FANCC, MLH1, MSH2, MSH6, NBN, PALB2, PMS2, PTEN, RAD51C, RAD51D, TP53, and XRCC2.    06/26/2015 Pathology Results    Initial Path Report Bone, curettage, Left femur met, intramedullary subtroch tissue - METASTATIC ADENOCARCINOMA.  Estrogen Receptor: 95%, POSITIVE, STRONG STAINING INTENSITY Progesterone Receptor: 2%, POSITIVE, STRONG  HER2 (+), IHC 3+    06/26/2015 Surgery    Biopsy of metastatic tissue and stabilization with Affixus trochanteric nail, proximal and distal interlock of left femur by Dr. [name]     06/28/2015 Imaging    CT Chest w/ Contrast 1. Extensive right internal mammary chain lymphadenopathy consistent with metastatic breast cancer. 2. Lytic metastases involving the  posterior elements at T1 with probable nondisplaced pathologic fracture of the spinous process. 3. No other evidence of thoracic metastatic disease. 4. Trace bilateral pleural effusions with associated bibasilar atelectasis. 5. Indeterminate right thyroid nodule, not previously imaged. This could be evaluated with thyroid ultrasound as clinically warranted.    07/01/2015 Imaging    Bone Scan 1. Increased activity noted throughout the left femur. Although metastatic disease cannot be excluded, these changes are most likely secondary to prior surgery. 2. No other focal abnormalities identified to suggest metastatic disease.    07/12/2015 - 07/26/2015 Radiation Therapy    Left femur, 30 Gy in 10 fractions by Dr. Sondra Come     07/14/2015 Initial Diagnosis    [name], right ([place])    07/19/2015 Imaging    Initial PET Scan 1. Severe multifocal osseous metastatic disease, much greater than anticipated based on prior imaging. 2. Multifocal nodal metastases involving the right internal mammary, prevascular and subpectoral lymph nodes. No axillary pulmonary involvement identified. 3. No distant extra osseous metastases.    07/30/2015 - 11/29/2015 Chemotherapy    Docetaxel, Herceptin and Perjeta every 3 weeks X6 cycles, pt tolerated moderately well, restaging scan showed excellent response     09/18/2015 Imaging    CT Head w/wo Contrast 1. No acute intracranial abnormality or significant interval change. 2. Stable minimal periventricular white matter hypoattenuation on the right. This may reflect a remote ischemic injury. 3. No evidence for metastatic disease to the brain. 4. No focal soft tissue lesion to explain the patient's tenderness.    10/30/2015 Imaging    CT Abdomen Pelvis w/ Contrast 1. Few mildly  prominent fluid-filled loops of small bowel scattered throughout the abdomen, with intraluminal fluid density within the distal colon. Given the provided history, findings  are suggestive of acute enteritis/diarrheal illness. 2. No other acute intra-abdominal or pelvic process identified. 3. Widespread osseous metastatic disease, better evaluated on most recent PET-CT from 07/19/2015. No pathologic fracture or other complication. 4. 11 mm hypodensity within the left kidney. This lesion measures intermediate density, and is indeterminate. While this lesion is similar in size relative to recent studies, this is increased in size relative to prior study from 2012. Further evaluation with dedicated renal mass protocol CT and/or MRI is recommended for complete characterization.    12/2015 - 05/14/2016 Chemotherapy    Maintenance Herceptin and pejeta every 3 weeks stopped on 05/14/16 due to disease progression    12/25/2015 Imaging    Restaging PET Scan 1. Markedly improved skeletal activity, with only several faint foci of residual accentuated metabolic activity, but resolution of the vast majority of the previously extensive osseous metastatic disease. 2. Resolution of the prior right internal mammary and right prevascular lymph nodes. 3. Residual subcutaneous edema and edema along fascia planes in the left upper thigh. This remains somewhat more than I would expect for placement of an IM nail 6 weeks ago. If there is leg swelling further down, consider Doppler venous ultrasound to rule out left lower extremity DVT. I do not perceive an obvious difference in density between the pelvic and common femoral veins on the noncontrast CT data. 4. Chronic right maxillary and right sphenoid sinusitis.    01/16/2016 -  Anti-estrogen oral therapy    Letrozole 2.5 mg daily. She was switched to exemestane due to disease progression on 02/08/17.    03/01/2016 Miscellaneous    Patient presented to ED following a fall; she reports she landed on her back and is now experiencing back pain. The patient was evaluated and discharge home same day with pain control.    05/01/2016 Imaging    CT  CAP IMPRESSION: 1. Stable exam.  No new or progressive disease identified. 2. No evidence for residual or recurrent adenopathy within the chest. 3. Stable bone metastasis.    05/01/2016 Imaging    BONE SCAN IMPRESSION: 1. Small focal uptake involving the anterior rib ends of the left second and right fourth ribs, new since the prior bone scan. The location of this uptake is more suggestive of a traumatic or inflammatory etiology as opposed to metastatic disease. No other evidence of metastatic disease. 2. There are other areas of stable uptake which are most likely degenerative/reactive, including right shoulder and cervical spine uptake and uptake along the right femur adjacent to the ORIF hardware.    05/20/2016 Imaging    NM PET Skull to Thigh IMPRESSION: 1. There multiple metastatic foci in the bony pelvis, including some new abnormal foci compare to the prior PET-CT, compatible with mildly progressive bony metastatic disease. Also, a left T11 vertebral hypermetabolic metastatic lesion is observed, new compared to the prior exam. 2. No active extraosseous malignancy is currently identified. 3. Right anterior fourth rib healing fracture, appears be    06/04/2016 - 01/08/2017 Chemotherapy    Second line chemotherapy Kadcyla every 3 weeks started on 06/04/16 and stopped on 01/08/17 due to mixed response.      10/05/2016 Imaging    CT CAP and Bone Scan:  Bones: left intramedullary rod and left femoral head neck screw in place. In the proximal diaphysis of the left femur there is cortical thickening  and irregularity. No lytic or blastic bone lesions. No fracture or vertebral endplate destruction.   No definite evidence of recurrent disease in the chest, abdomen, or pelvis.    10/28/2016 Imaging    CT A/P IMPRESSION: No acute process demonstrated in the abdomen or pelvis. No evidence of bowel obstruction or inflammation.    11/04/2016 Imaging    DG foot complete  left: IMPRESSION: No fracture or dislocation.  No soft tissue abnormality    11/23/2016 Imaging    CT RIGHT FEMUR IMPRESSION: Negative CT scan of the right thigh.  No visible metastatic disease.  CT LEFT FEMUR IMPRESSION: 1. Postsurgical changes related to prior cephalomedullary rod fixation of the left femur with linear lucency along the anterolateral proximal femoral cortex at level of the lesser trochanter, suspicious for nondisplaced fracture. 2. Slightly permeative appearance of the proximal left femoral cortex at the site of known prior osseous metastases is largely unchanged. 3. Unchanged patchy lucency and sclerosis along the anterior acetabulum, which appears to correspond with an area of faint uptake on the prior PET-CT, suspicious for osseous metastasis. These results will be called to the ordering clinician or representative by the Radiologist Assistant, and communication documented in the PACS or zVision Dashboard.      01/27/2017 Imaging    IMPRESSION: 1. Mixed response to osseous metastasis. Some hypermetabolic lesions are new and increasingly hypermetabolic, while another index lesion is decreasingly hypermetabolic. 2. No evidence of hypermetabolic soft tissue metastasis.     02/08/2017 -  Antibody Plan    -Exemestane 25 mg daily since 02/08/17 -Herceptin every 3 week since 02/08/17 -Oral Lanpantinib 1538m and decreased to 503mdue to diarrhea and depression. Due to disease progression she swithced to Verzenio 1507mn 05/04/17. She did not toelrate well so she was switched to Ibrance 100m46m 05/31/17. Due to her neutropenia, Ibrance dose reduced to 75 mg daily, 3 weeks on, one-week off.     04/30/2017 PET scan    IMPRESSION: 1. Primarily increase in hypermetabolism of osseous metastasis. An isolated right acetabular lesion is decreased in hypermetabolism since the prior exam. 2. No evidence of hypermetabolic soft tissue metastasis. 3. Similar non FDG avid  right-sided thyroid nodule, favoring a benign etiology. Recommend attention on follow-up.    08/30/2017 PET scan    08/30/2017 PET Scan  IMPRESSION: 1. Worsening osseous metastatic disease. 2. Focal hypermetabolism in the central spinal canal the L1 level, similar to the prior exam. Metastatic implant cannot be excluded. 3. Slight enlargement of a right thyroid nodule which now appears more cystic in character. Consider further evaluation with thyroid ultrasound. If patient is clinically hyperthyroid, consider nuclear medicine thyroid uptake and scan    08/30/2017 Progression    08/30/2017 PET Scan  IMPRESSION: 1. Worsening osseous metastatic disease. 2. Focal hypermetabolism in the central spinal canal the L1 level, similar to the prior exam. Metastatic implant cannot be excluded. 3. Slight enlargement of a right thyroid nodule which now appears more cystic in character. Consider further evaluation with thyroid ultrasound. If patient is clinically hyperthyroid, consider nuclear medicine thyroid uptake and scan    10/19/2017 -  Chemotherapy    The patient had trastuzumab (HERCEPTIN) 441 mg in sodium chloride 0.9 % 250 mL chemo infusion, 8 mg/kg, Intravenous,  Once, 0 of 6 cycles PACLitaxel (TAXOL) 132 mg in sodium chloride 0.9 % 250 mL chemo infusion (</= 80mg22m, 80 mg/m2, Intravenous,  Once, 0 of 6 cycles pertuzumab (PERJETA) 840 mg in sodium chloride 0.9 % 250  mL chemo infusion, 840 mg, Intravenous, Once, 0 of 6 cycles  for chemotherapy treatment.    Cancer Staging No matching staging information was found for the patient.       [name], right ([place])   11/1997 Cancer Diagnosis    Patient had 1.4 cm poorly differentiated right breast carcinoma diagnosed in Nov 1999 at age 43, 1/7 axillary nodes involved, ER/PR and HER 2 positive. She had mastectomy with the 7 axillary node evaluation, 4 cycles of adriamycin/ cytoxan followed by taxotere, then five  years of tamoxifen thru June 2005 (no herceptin in 1999). She had bilateral oophorectomy in May 2005. She was briefly on aromatase inhibitor after tamoxifen, but discontinued this herself due to poor tolerance.    04/04/2015 Genetic Testing    Negative genetic testing on the breast/ovarian cancer pnael.  The Breast/Ovarian gene panel offered by [facility] includes sequencing and rearrangement analysis for the following 20 genes:  ATM, BARD1, BRCA1, BRCA2, BRIP1, CDH1, CHEK2, EPCAM, FANCC, MLH1, MSH2, MSH6, NBN, PALB2, PMS2, PTEN, RAD51C, RAD51D, TP53, and XRCC2.    06/26/2015 Pathology Results    Initial Path Report Bone, curettage, Left femur met, intramedullary subtroch tissue - METASTATIC ADENOCARCINOMA.  Estrogen Receptor: 95%, POSITIVE, STRONG STAINING INTENSITY Progesterone Receptor: 2%, POSITIVE, STRONG  HER2 (+), IHC 3+    06/26/2015 Surgery    Biopsy of metastatic tissue and stabilization with Affixus trochanteric nail, proximal and distal interlock of left femur by Dr. [name]     06/28/2015 Imaging    CT Chest w/ Contrast 1. Extensive right internal mammary chain lymphadenopathy consistent with metastatic breast cancer. 2. Lytic metastases involving the posterior elements at T1 with probable nondisplaced pathologic fracture of the spinous process. 3. No other evidence of thoracic metastatic disease. 4. Trace bilateral pleural effusions with associated bibasilar atelectasis. 5. Indeterminate right thyroid nodule, not previously imaged. This could be evaluated with thyroid ultrasound as clinically warranted.    07/01/2015 Imaging    Bone Scan 1. Increased activity noted throughout the left femur. Although metastatic disease cannot be excluded, these changes are most likely secondary to prior surgery. 2. No other focal abnormalities identified to suggest metastatic disease.    07/12/2015 - 07/26/2015 Radiation Therapy    Left femur, 30 Gy in 10 fractions by Dr. Sondra Come     07/14/2015  Initial Diagnosis    [name], right ([place])    07/19/2015 Imaging    Initial PET Scan 1. Severe multifocal osseous metastatic disease, much greater than anticipated based on prior imaging. 2. Multifocal nodal metastases involving the right internal mammary, prevascular and subpectoral lymph nodes. No axillary pulmonary involvement identified. 3. No distant extra osseous metastases.    07/30/2015 - 11/29/2015 Chemotherapy    Docetaxel, Herceptin and Perjeta every 3 weeks X6 cycles, pt tolerated moderately well, restaging scan showed excellent response     09/18/2015 Imaging    CT Head w/wo Contrast 1. No acute intracranial abnormality or significant interval change. 2. Stable minimal periventricular white matter hypoattenuation on the right. This may reflect a remote ischemic injury. 3. No evidence for metastatic disease to the brain. 4. No focal soft tissue lesion to explain the patient's tenderness.    10/30/2015 Imaging    CT Abdomen Pelvis w/ Contrast 1. Few mildly prominent fluid-filled loops of small bowel scattered throughout the abdomen, with intraluminal fluid density within the distal colon. Given the provided history, findings are suggestive of acute enteritis/diarrheal illness. 2. No  other acute intra-abdominal or pelvic process identified. 3. Widespread osseous metastatic disease, better evaluated on most recent PET-CT from 07/19/2015. No pathologic fracture or other complication. 4. 11 mm hypodensity within the left kidney. This lesion measures intermediate density, and is indeterminate. While this lesion is similar in size relative to recent studies, this is increased in size relative to prior study from 2012. Further evaluation with dedicated renal mass protocol CT and/or MRI is recommended for complete characterization.    12/2015 - 05/14/2016 Chemotherapy    Maintenance Herceptin and pejeta every 3 weeks stopped on 05/14/16 due to disease progression     12/25/2015 Imaging    Restaging PET Scan 1. Markedly improved skeletal activity, with only several faint foci of residual accentuated metabolic activity, but resolution of the vast majority of the previously extensive osseous metastatic disease. 2. Resolution of the prior right internal mammary and right prevascular lymph nodes. 3. Residual subcutaneous edema and edema along fascia planes in the left upper thigh. This remains somewhat more than I would expect for placement of an IM nail 6 weeks ago. If there is leg swelling further down, consider Doppler venous ultrasound to rule out left lower extremity DVT. I do not perceive an obvious difference in density between the pelvic and common femoral veins on the noncontrast CT data. 4. Chronic right maxillary and right sphenoid sinusitis.    01/16/2016 -  Anti-estrogen oral therapy    Letrozole 2.5 mg daily. She was switched to exemestane due to disease progression on 02/08/17.    03/01/2016 Miscellaneous    Patient presented to ED following a fall; she reports she landed on her back and is now experiencing back pain. The patient was evaluated and discharge home same day with pain control.    05/01/2016 Imaging    CT CAP IMPRESSION: 1. Stable exam.  No new or progressive disease identified. 2. No evidence for residual or recurrent adenopathy within the chest. 3. Stable bone metastasis.    05/01/2016 Imaging    BONE SCAN IMPRESSION: 1. Small focal uptake involving the anterior rib ends of the left second and right fourth ribs, new since the prior bone scan. The location of this uptake is more suggestive of a traumatic or inflammatory etiology as opposed to metastatic disease. No other evidence of metastatic disease. 2. There are other areas of stable uptake which are most likely degenerative/reactive, including right shoulder and cervical spine uptake and uptake along the right femur adjacent to the ORIF hardware.    05/20/2016 Imaging     NM PET Skull to Thigh IMPRESSION: 1. There multiple metastatic foci in the bony pelvis, including some new abnormal foci compare to the prior PET-CT, compatible with mildly progressive bony metastatic disease. Also, a left T11 vertebral hypermetabolic metastatic lesion is observed, new compared to the prior exam. 2. No active extraosseous malignancy is currently identified. 3. Right anterior fourth rib healing fracture, appears be    06/04/2016 - 01/08/2017 Chemotherapy    Second line chemotherapy Kadcyla every 3 weeks started on 06/04/16 and stopped on 01/08/17 due to mixed response.      10/05/2016 Imaging    CT CAP and Bone Scan:  Bones: left intramedullary rod and left femoral head neck screw in place. In the proximal diaphysis of the left femur there is cortical thickening and irregularity. No lytic or blastic bone lesions. No fracture or vertebral endplate destruction.   No definite evidence of recurrent disease in the chest, abdomen, or pelvis.  10/28/2016 Imaging    CT A/P IMPRESSION: No acute process demonstrated in the abdomen or pelvis. No evidence of bowel obstruction or inflammation.    11/04/2016 Imaging    DG foot complete left: IMPRESSION: No fracture or dislocation.  No soft tissue abnormality    11/23/2016 Imaging    CT RIGHT FEMUR IMPRESSION: Negative CT scan of the right thigh.  No visible metastatic disease.  CT LEFT FEMUR IMPRESSION: 1. Postsurgical changes related to prior cephalomedullary rod fixation of the left femur with linear lucency along the anterolateral proximal femoral cortex at level of the lesser trochanter, suspicious for nondisplaced fracture. 2. Slightly permeative appearance of the proximal left femoral cortex at the site of known prior osseous metastases is largely unchanged. 3. Unchanged patchy lucency and sclerosis along the anterior acetabulum, which appears to correspond with an area of faint uptake on the prior PET-CT,  suspicious for osseous metastasis. These results will be called to the ordering clinician or representative by the Radiologist Assistant, and communication documented in the PACS or zVision Dashboard.      01/27/2017 Imaging    IMPRESSION: 1. Mixed response to osseous metastasis. Some hypermetabolic lesions are new and increasingly hypermetabolic, while another index lesion is decreasingly hypermetabolic. 2. No evidence of hypermetabolic soft tissue metastasis.     02/08/2017 -  Antibody Plan    -Exemestane 25 mg daily since 02/08/17 -Herceptin every 3 week since 02/08/17 -Oral Lanpantinib 1518m and decreased to 5088mdue to diarrhea and depression. Due to disease progression she swithced to Verzenio 15090mn 05/04/17. She did not toelrate well so she was switched to Ibrance 100m62m 05/31/17. Due to her neutropenia, Ibrance dose reduced to 75 mg daily, 3 weeks on, one-week off.     04/30/2017 PET scan    IMPRESSION: 1. Primarily increase in hypermetabolism of osseous metastasis. An isolated right acetabular lesion is decreased in hypermetabolism since the prior exam. 2. No evidence of hypermetabolic soft tissue metastasis. 3. Similar non FDG avid right-sided thyroid nodule, favoring a benign etiology. Recommend attention on follow-up.    08/30/2017 PET scan    08/30/2017 PET Scan  IMPRESSION: 1. Worsening osseous metastatic disease. 2. Focal hypermetabolism in the central spinal canal the L1 level, similar to the prior exam. Metastatic implant cannot be excluded. 3. Slight enlargement of a right thyroid nodule which now appears more cystic in character. Consider further evaluation with thyroid ultrasound. If patient is clinically hyperthyroid, consider nuclear medicine thyroid uptake and scan    08/30/2017 Progression    08/30/2017 PET Scan  IMPRESSION: 1. Worsening osseous metastatic disease. 2. Focal hypermetabolism in the central spinal canal the L1 level, similar to the  prior exam. Metastatic implant cannot be excluded. 3. Slight enlargement of a right thyroid nodule which now appears more cystic in character. Consider further evaluation with thyroid ultrasound. If patient is clinically hyperthyroid, consider nuclear medicine thyroid uptake and scan    09/29/2017 - 10/11/2017 Radiation Therapy    With Dr, KinaSondra Comerted 09/29/2017 Finished 10/11/2017    10/19/2017 -  Chemotherapy    -Exemestane 25mg67mly starting 02/08/17 -Herceptin every 3 weeks starting 02/08/17 -Neratinib started on 120mg 15my on 09/02/2017, stopped on September 21, 2017 due to poor tolerance.     HISTORY OF PRESENTING ILLNESS (From Dr. 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 She has had an  excellent response to 6 cycles of taxotere herceptin perjeta from 07-30-15 thru 11-29-15. She is continuing herceptin and perjeta every 3 weeks. She had extreme local pain with first xgeva injection on 01-16-16 and prefers resuming previous zometa. She began letrozole ~ 01-16-16, has no complaints about this at all.  Last imaging was PET 12-25-15.  NOTE CTs, plain Xrays and bone scan did NOT show any of the extensively metastatic bone involvement 06-2015; MRI and PET did image disease clearly.  Patient continues to do very well overall. MRSA skin lesions on scalp and face improved quickly with doxycycline starting 01-16-16. She continues to use Dial soap and we have again strongly encouraged good hygiene with other family members at home. She has had the 3 lymphedema PT sessions allowed by insurance, continues to do massage for LLE herself, still has not gotten the ordered compression apparatus for home and will follow up with PT on that. Swelling in LLE has improved with the interventions and does not seem as uncomfortable. She denies any pain. Appetite is  excellent, bowels fine, no SOB, good energy, no problems with PAC, no noted changes in left breast. No bleeding. No fever or symptoms of infection. She slipped in tub last week, fell onto right arm fortunately without injury. She was upset by a visitor and shaky then, but no tremors otherwise.   She has two children and 4 grandchildren. She has had genetic testing and it was negative. She lives at home with her husband. She is very active. Her children live close to her.   CURRENT THERAPY:    -Exemestane 80m daily starting 02/08/17, stopped on 10/20/2017 when she started chemo  -Herceptin every 3 weeks starting 02/08/17, perjeta added on 10/20/2017  -weekly Taxol started on 10/21/2017   INTERIM HISTORY:   WAVIONA [id number].[id number]. female returns today for follow up. Since our last visit, she has seen Dr. KSondra Comeand started radiation therapy on 09/29/2017 and finished on 10/11/2017. She was seen by NP Curcio and was noted to have tolrated radiation well. Today, she is here with her husband and interpreter. She feels better. Her hip pain improved and she is eating well. She is worried about taking chemo that tastes like metal.    MEDICAL HISTORY:  Past Medical History:  Diagnosis Date  . Abnormal Pap smear   . Anxiety   . Breast cancer ([place]   . [place]   breast-s/p mastectomy, chemo, rad  . CIN I (cervical intraepithelial neoplasia I) 2003   by colpo  . Congenital deafness   . Deaf   . Depression   . Radiation 07/12/15-07/26/15   left femur 30 Gy  . S/P bilateral oophorectomy     SURGICAL HISTORY: Past Surgical History:  Procedure Laterality Date  . ABDOMINAL HYSTERECTOMY    . INTRAMEDULLARY (IM) NAIL INTERTROCHANTERIC Left 06/26/2015   Procedure: LEFT BIOMET LONG AFFIXS NAIL;  Surgeon: MMarybelle Killings MD;  [facility] [facility]  Service: Orthopedics;  Laterality: Left;  . IR GENERIC HISTORICAL  08/06/2015   IR [place] V[place] ACCESS LEFT 08/06/2015 GAletta Edouard MD [facility]  .  IR GENERIC HISTORICAL  08/06/2015   IR FLUORO GUIDE CV LINE LEFT 08/06/2015 GAletta Edouard MD [facility]  . IR GENERIC HISTORICAL  03/05/2016   IR CV LINE INJECTION 03/05/2016 AMarkus Daft MD [facility]  . MASTECTOMY     right breast  . MASTECTOMY    . OVARIAN CYST REMOVAL    . RADIOLOGY  WITH ANESTHESIA Left 06/25/2015   Procedure: MRI OF LEFT HIP WITH OR WITHOUT CONTRAST;  Surgeon: Medication Radiologist, MD;  Location: [facility];  Service: Radiology;  Laterality: Left;  DR. MCINTYRE/MRI  . TUBAL LIGATION      SOCIAL HISTORY: Social History   Socioeconomic History  . Marital status: Married    Spouse name: Not on file  . Number of children: 2  . Years of education: Not on file  . Highest education level: Not on file  Occupational History  . Not on file  Social Needs  . Financial resource strain: Not on file  . Food insecurity:    Worry: Not on file    Inability: Not on file  . Transportation needs:    Medical: Not on file    Non-medical: Not on file  Tobacco Use  . Smoking status: Never Smoker  . Smokeless tobacco: Never Used  Substance and Sexual Activity  . Alcohol use: No  . Drug use: No  . Sexual activity: Yes    Birth control/protection: Surgical  Lifestyle  . Physical activity:    Days per week: Not on file    Minutes per session: Not on file  . Stress: Not on file  Relationships  . Social connections:    Talks on phone: Not on file    Gets together: Not on file    Attends religious service: Not on file    Active member of club or organization: Not on file    Attends meetings of clubs or organizations: Not on file    Relationship status: Not on file  . Intimate partner violence:    Fear of current or ex partner: Not on file    Emotionally abused: Not on file    Physically abused: Not on file    Forced sexual activity: Not on file  Other Topics Concern  . Not on file  Social History Narrative  . Not on file    FAMILY HISTORY: Family History  Problem  Relation Age of Onset  . Hypertension Mother   . Seizures Mother   . Alzheimer's disease Maternal Uncle   . Pancreatitis Maternal Grandmother   . Heart attack Maternal Grandfather     ALLERGIES:  is allergic to chlorhexidine; promethazine hcl; and diphenhydramine hcl.  MEDICATIONS:  Current Outpatient Medications  Medication Sig Dispense Refill  . Budesonide ER 9 MG TB24 Take 9 mg by mouth daily. Take with food early in the day. 30 tablet 2  . Cholecalciferol 2000 units CAPS Take 1 capsule by mouth daily.    . diphenoxylate-atropine (LOMOTIL) 2.5-0.025 MG tablet Take 1-2 tablets by mouth 4 (four) times daily as needed for diarrhea or loose stools. 30 tablet 0  . divalproex (DEPAKOTE) 250 MG DR tablet Take 1 tablet (250 mg total) by mouth at bedtime. 30 tablet 2  . exemestane (AROMASIN) 25 MG tablet Take 1 tablet (25 mg total) by mouth daily after breakfast. 30 tablet 1  . HYDROcodone-acetaminophen (NORCO) 10-325 MG tablet Take 1 tablet by mouth every 6 (six) hours as needed. 45 tablet 0  . lidocaine-prilocaine (EMLA) cream Apply 1 application topically as needed. Apply to Porta-Cath 1-2 hours prior to access as directed. 30 g 2  . loperamide (IMODIUM) 2 MG capsule Take 2 capsules (4 mg total) by mouth 3 (three) times daily. May take additional tablets daily to titrate to 1-2 bowel movements each day 200 capsule 2  . potassium chloride (K-DUR) 10 MEQ tablet Take 1 tablet (  10 mEq total) by mouth 2 (two) times daily. 60 tablet 0  . prochlorperazine (COMPAZINE) 10 MG tablet Take 1 tablet (10 mg total) by mouth every 6 (six) hours as needed for nausea or vomiting. 30 tablet 2  . ondansetron (ZOFRAN) 8 MG tablet Take 1 tablet (8 mg total) by mouth 2 (two) times daily as needed (Nausea or vomiting). 30 tablet 1  . prochlorperazine (COMPAZINE) 10 MG tablet Take 1 tablet (10 mg total) by mouth every 6 (six) hours as needed (Nausea or vomiting). 30 tablet 1   No current facility-administered  medications for this visit.    Facility-Administered Medications Ordered in Other Visits  Medication Dose Route Frequency Provider Last Rate Last Dose  . heparin lock flush 100 unit/mL  500 Units Intracatheter Once PRN Truitt Merle, MD      . loratadine (CLARITIN) tablet 10 mg  10 mg Oral Daily Truitt Merle, MD   10 mg at 10/20/17 1021  . sodium chloride 0.9 % 1,000 mL with potassium chloride 10 mEq infusion   Intravenous Continuous Gordy Levan, MD   Stopped at 09/10/15 1653  . sodium chloride 0.9 % injection 10 mL  10 mL Intravenous PRN Livesay, Lennis P, MD      . sodium chloride flush (NS) 0.9 % injection 10 mL  10 mL Intracatheter PRN Truitt Merle, MD        REVIEW OF SYSTEMS:  Constitutional: Denies fevers, chills or abnormal night sweats , low appetite from stress, weight stable Eyes: Denies blurriness of vision, double vision or watery eyes Ears, nose, mouth, throat, and face: Denies mucositis or sore throat (+) deaf Respiratory: Denies cough, dyspnea or wheezes  Cardiovascular: Denies palpitation, chest discomfort or lower extremity swelling Skin: No rashes or discolorations  Gastrointestinal:  No new abdominal pain, or changes in BMs  Lymphatics: Denies new lymphadenopathy or easy bruising. MSK: (+) right lower back/tailbone pain, improving  Behavioral/Psych: No new changes  All other systems were reviewed with the patient and are negative.  PHYSICAL EXAMINATION:  ECOG PERFORMANCE STATUS: 1 Vitals:   10/20/17 0846  BP: 119/68  Pulse: 63  Resp: 18  Temp: 98 F (36.7 C)  TempSrc: Oral  SpO2: 100%  Weight: 129 lb 4.8 oz (58.7 kg)  Height: _0  (1.702 m)    GENERAL:alert, no distress and comfortable  EYES: normal, conjunctiva are pink and non-injected, sclera clear OROPHARYNX:no exudate, no erythema and lips, buccal mucosa, and tongue normal  NECK: supple, thyroid normal size, non-tender, without nodularity LYMPH:  no palpable lymphadenopathy in the cervical, axillary or  inguinal LUNGS: clear to auscultation and percussion with normal breathing effort HEART: regular rate & rhythm and no murmurs and no lower extremity edema ABDOMEN:abdomen soft, non-tender and normal bowel sounds. No organomegaly. Musculoskeletal:no cyanosis of digits and no clubbing  PSYCH: alert & oriented x 3 with fluent speech NEURO: no focal motor/sensory deficits Breasts: Breast inspection showed Right breast is surgically absent status post reconstruction. Palpation of the breasts and axilla revealed no obvious mass that I could appreciate.   LABORATORY DATA:  I have reviewed the data as listed CBC Latest Ref Rng & Units 10/20/2017 10/08/2017 09/27/2017  WBC 4.0 - 10.5 K/uL 3.2(L) 2.8(L) 5.3  Hemoglobin 12.0 - 15.0 g/dL 11.2(L) 11.5(L) 10.3(L)  Hematocrit 36.0 - 46.0 % 32.0(L) 32.7(L) 29.5(L)  Platelets 150 - 400 K/uL 108(L) 177 204   CMP Latest Ref Rng & Units 10/20/2017 10/08/2017 09/27/2017  Glucose 70 - 99 mg/dL 106(H)  94 92  BUN 6 - 20 mg/dL _0 Creatinine 0.44 - 1.00 mg/dL 0.77 0.78 0.81  Sodium 135 - 145 mmol/L 143 141 145  Potassium 3.5 - 5.1 mmol/L 3.5 3.8 2.7(LL)  Chloride 98 - 111 mmol/L 108 106 107  CO2 22 - 32 mmol/L _1 Calcium 8.9 - 10.3 mg/dL 9.1 9.3 8.7(L)  Total Protein 6.5 - 8.1 g/dL 6.8 6.7 6.0(L)  Total Bilirubin 0.3 - 1.2 mg/dL 2.9(H) 1.8(H) 1.2  Alkaline Phos 38 - 126 U/L 71 60 61  AST 15 - 41 U/L 19 15 14(L)  ALT 0 - 44 U/L _2 TUMOR MARKER  CA 27.29 Results for ELAH, AVELLINO (MRN [id number]) as of 10/12/2017 15:49  Ref. Range 07/19/2017 13:36 08/23/2017 08:12 10/08/2017 08:07  CA 27.29 Latest Ref Range: 0.0 - 38.6 U/mL 49.2 (H) 62.0 (H) 56.0 (H)     PROCEDURES: 08/04/2017 ECHO Study Conclusions  - Left ventricle: Inferobasal hypokinesis. The cavity size was   normal. The estimated ejection fraction was 55%. Wall motion was   normal; there were no regional wall motion abnormalities. Left   ventricular diastolic function  parameters were normal. - Mitral valve: Calcified annulus. Mildly thickened leaflets . - Atrial septum: No defect or patent foramen ovale was identified. - Impressions: Normal GLS-21.2  RADIOGRAPHIC STUDIES:  08/30/2017 PET Scan  IMPRESSION: 1. Worsening osseous metastatic disease. 2. Focal hypermetabolism in the central spinal canal the L1 level, similar to the prior exam. Metastatic implant cannot be excluded. 3. Slight enlargement of a right thyroid nodule which now appears more cystic in character. Consider further evaluation with thyroid ultrasound. If patient is clinically hyperthyroid, consider nuclear medicine thyroid uptake and scan.  PET Scan 04/30/17 IMPRESSION: 1. Primarily increase in hypermetabolism of osseous metastasis. An isolated right acetabular lesion is decreased in hypermetabolism since the prior exam. 2. No evidence of hypermetabolic soft tissue metastasis. 3. Similar non FDG avid right-sided thyroid nodule, favoring a benign etiology. Recommend attention on follow-up.  PET SCAN: 01/27/17 IMPRESSION: 1. Mixed response to osseous metastasis. Some hypermetabolic lesions are new and increasingly hypermetabolic, while another index lesion is decreasingly hypermetabolic. 2. No evidence of hypermetabolic soft tissue metastasis.  CT A/P IMPRESSION: 10/28/16 No acute process demonstrated in the abdomen or pelvis. No evidence of bowel obstruction or inflammation.  CT CAP and Bone Scan: 10/05/16  Bones: left intramedullary rod and left femoral head neck screw in place. In the proximal diaphysis of the left femur there is cortical thickening and irregularity. No lytic or blastic bone lesions. No fracture or vertebral endplate destruction.  No definite evidence of recurrent disease in the chest, abdomen, or pelvis.  PET 05/20/2016 IMPRESSION: 1. There multiple metastatic foci in the bony pelvis, including some new abnormal foci compare to the prior PET-CT, compatible with  mildly progressive bony metastatic disease. Also, a left T11 vertebral hypermetabolic metastatic lesion is observed, new compared to the prior exam. 2. No active extraosseous malignancy is currently identified. 3. Right anterior fourth rib healing fracture, appears benign.  NM Bone Scan 05/01/2016 IMPRESSION: 1. Small focal uptake involving the anterior rib ends of the left second and right fourth ribs, new since the prior bone scan. The location of this uptake is more suggestive of a traumatic or inflammatory etiology as opposed to metastatic disease. No other evidence of metastatic disease. 2. There are other areas of stable uptake which are most likely degenerative/reactive, including right shoulder and cervical  spine uptake and uptake along the right femur adjacent to the ORIF Hardware.  CT CAP 05/01/2016 IMPRESSION: 1. Stable exam.  No new or progressive disease identified. 2. No evidence for residual or recurrent adenopathy within the chest. 3. Stable bone metastasis.  I have personally reviewed the radiological images as listed and agreed with the findings in the report.  ASSESSMENT & PLAN:  Mrs. Mancinelli is a 52 y.o. postmenopause female with:  1. 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 -Restaging PET 12-25-15 markedly improved of bone metastasis after initial systemic chemo  -she was switched to letrozole and maintenance Herceptin and the perjeta on 01/16/16 which stopped on 05/14/16.  -Did not tolerate SQ xgeva and it was changed to zometa in 06/2015 -Continue monitoring ECHO every 3 months when she is on herceptin -I previously reviewed her CT scan and bone scan from 05/01/2016, which showed stable disease overall, a few new uptake of anterior ribs on bone scan are indeterminate. -I previously discussed her restaging PET scan from  05/20/2016. Unfortunately it showed mild disease progression of her bone metastasis  -I have changed her treatment to second line Kadcyla and letrozole on 06/04/16, she is tolerating very well. She declined other chemo.  -We previously received records from [facility] in [place], [place]. She underwent CT CAP w contrast on 10/02/16 and bone scan on 09/21/16. CT shows no evidence of recurrent disease in the chest, abdomen, or pelvis; Bone scan indicates no lytic or blastic bone lesions.  -She has been on Zometa every 4-6 weeks since June 2017. We previously changed to every 3 months in 12/2016. 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  No other visceral organ metastasis. She is clinically doing well.  -Kadcyla was switched to Exemestane, oral HER2 antibody treatment lapatinib and herceptin starting 02/08/17. -She tried lapatinib, could not tolerate and stopped. -Her PET Scan from 04/30/17 revealed a primarily increase in hypermetabolism of osseous metastasis. There are no new lesions.  I reviewed the images and compared to the previous images in person with patient and her husband. I suspect her progression is due to her not being able to fully tolerate lapatinib -I previously discussed options with clinical trial for the new Her2 antibody conjugate Trastuzumab deruxtecan, the phase 3 clinical trial has been open, and the closest site is in [place] and [employer]. She is not interested in going to [place] at this time. -She did not respond to CDK4/6 inhibitor -Her 08/30/2017 PET Scan showed disease progression in her bones, no other new visceral metastasis. I reviewed her PET images with her in person. She denies hip or pelvic pain and has stable right shoulder and left hip  pain from before.  -Will stop Ibrance due to disease progression, and started her on Neratinib -She tolerated neratinib very poorly, with diarrhea, nausea, vomiting, taste change and poor appetite. I stopped it previously. --Due to her disease progression, I recommend her to start chemotherapy again with weekly Taxol, along with Herceptin and pejeta every 3 weeks.  Side effects reviewed with her again, she agrees to proceed. -Due to her elevated total bilirubin 2.9 today, I will reduce her Taxol dose  to 60 mg/m for this week, and follow her liver function weekly -Discussed that she will continue IV chemo for a minimum of 3-4 months and likely 5-6 months or longer, with a break afterwards.  From second week, if she has moderate side effects, will change Taxol to every 2-3 weeks on and one-week off -She recently completed a course of palliative radiation, her bilateral hip pain has much improved. --Labs reviewed, Cbc showed Hg 11.2 Platelets 108K, potassium 3.5, total bilirubin 2.9, the rest of CMP unremarkable.  Her thrombus cytopenia is probably related to her recent radiation. -I will stop her exemestane  -Repeat PET scan in 2-3 months -f/u in 1 and 3 weeks  2. Right lower back pain, tailbone pain -Due to the patient's right lower back pain/tailbone pain, I previously recommended radiation therapy to the patient's lower back and tailbone region to help alleviate her pain, to which the patient agreed with radiation treatment. The patient has received radiation therapy to her left hip in 2017 with Radiation Oncologist, Dr. Sondra Come in the past. I previously sent a message to Dr. Sondra Come for an urgent consultation.  -She received a course of palliative radiation, her pain much improved.   3. History of MRSA skin infection scalp, boil at low back scalp and upper lip -I prescribed doxycycline on 04/24/16 for 7 days  -Resolved now.  4. Congenital deafness -needs sign language interpreter for all  visits.  -Husband also deaf.  5. premature surgical menopause -post BSO 201fr pelvic pain and ovarian cysts, likely was also beneficial from standpoint of the breast cancer. -hysterectomy   6. Intermittent anemia  -mild, possible related to prior chemo -She is asymptomatic, we'll continue close monitoring  7. Hypokalemia  -She has had intermittent hypokalemia --She is only taking oral potassium 10 mEq twice daily daily, will continue   8. Goal of care discussion  -We previously discussed the incurable nature of her cancer, and the overall poor prognosis, especially if she does not have good response to chemotherapy or progress on chemo.  -The patient understands the goal of care is palliative. -she is full code for now   9. Bilateral Leg Pain -She initially presented with worsening left leg and hip pain secondary to bone mets, she had surgery before, this was much improved after her prior chemo -The patient previously took NTuc[place]for this pain, 1 tablet every 6 hours as needed for pain. Which helped. -I previously encouraged her to follow up with Dr. YLorin Mercyabout the pain in her left leg. 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 She will continue calcium and Norco as needed. I previously suggested that she try tylenol for pain during the day. She is willing to try.  -Her pain is improving now  10. Insomnia, Depression, Stress  -I previously gave her Ambien 585m but it did not work well.  -continue ativan 1 mg to take at night as needed  -mirtazapine did not work, she also tried trazodone from PCP which gave her a rash. She stopped taking it.  -After the death of her dog she has been stressed and sad from how it happened.  -I previously offered her the chance to speak with our counselor or a therapist, she declined at this time. She denied inclinations of self  harm.  -I also previously offered her the choice of antidepressants. She will try coping without  medication for now. I previously encouraged her to speak with family or friends about her feelings.   11.  Hyperbilirubinemia  -She has developed mild hyperbilirubinemia recently, TBIL 1.2-2.7, okay to continue treatment  -increased to 2.8 on 08/31/2017   Plan:  -Refilled Zofran and Compazine today -Labs reviewed, adequate for treatment, will start weekly Taxol, Herceptin and pejeta every 3 weeks today.  However patient had to leave early today, and Taxol will be moved to tomorrow -Due to her hyperbilirubinemia, I will reduce her Taxol to 60 mg/m for this week -She also received a Zometa today, and continue every 3 months -We will stop exemestane today  -She will receive flu shot today -f/u next week   Pt had many questions. All questions were answered. The patient knows to call the clinic with any problems, questions or concerns.  I spent 30 minutes counseling the patient face to face. The total time spent in the appointment was 40 minutes and more than 50% was on counseling.   Dierdre Searles Dweik am acting as scribe for Dr. Truitt Merle.  I have reviewed the above documentation for accuracy and completeness, and I agree with the above.    Truitt Merle, MD 10/20/2017

## 2017-10-13 ENCOUNTER — Encounter: Payer: Self-pay | Admitting: Radiation Oncology

## 2017-10-13 NOTE — Progress Notes (Signed)
  Radiation Oncology         [phone] ________________________________  Name: [name] MRN: [id number]  Date: 10/13/2017  DOB: Jul 17, 1965  End of Treatment Note  Diagnosis:   [name]     Indication for treatment:  Palliative       Radiation treatment dates:   09/28/17 - 10/11/17  Site/dose:   Pelvis, Right, Sacro-Illiac/ 27 Gy in 9 fractions of 3 Gy  Beams/energy:   3D, Photon/ 6X, 15X  Narrative: The patient tolerated radiation treatment relatively well.  She reported unchanged pelvic/lower back pain throughout treatment. She reported mild fatigue that left her feeling sleepy and grumpy. She denied diarrhea, nausea or vomiting, and any skin changes.   Plan: The patient has completed radiation treatment. The patient will return to radiation oncology clinic for routine followup in one month. I advised them to call or return sooner if they have any questions or concerns related to their recovery or treatment.  -----------------------------------  Blair Promise, PhD, MD  This document serves as a record of services personally performed by Gery Pray, MD. It was created on his behalf by Wilburn Mylar, a trained medical scribe. The creation of this record is based on the scribe's personal observations and the provider's statements to them. This document has been checked and approved by the attending provider.

## 2017-10-20 ENCOUNTER — Telehealth: Payer: Self-pay

## 2017-10-20 ENCOUNTER — Inpatient Hospital Stay: Payer: Medicaid Other

## 2017-10-20 ENCOUNTER — Inpatient Hospital Stay (HOSPITAL_BASED_OUTPATIENT_CLINIC_OR_DEPARTMENT_OTHER): Payer: Medicaid Other | Admitting: Hematology

## 2017-10-20 ENCOUNTER — Encounter: Payer: Self-pay | Admitting: Hematology

## 2017-10-20 ENCOUNTER — Inpatient Hospital Stay: Payer: Medicaid Other | Attending: Hematology

## 2017-10-20 VITALS — BP 119/68 | HR 63 | Temp 98.0°F | Resp 18 | Ht 67.0 in | Wt 129.3 lb

## 2017-10-20 DIAGNOSIS — H905 Unspecified sensorineural hearing loss: Secondary | ICD-10-CM | POA: Insufficient documentation

## 2017-10-20 DIAGNOSIS — G4700 Insomnia, unspecified: Secondary | ICD-10-CM

## 2017-10-20 DIAGNOSIS — E876 Hypokalemia: Secondary | ICD-10-CM

## 2017-10-20 DIAGNOSIS — D649 Anemia, unspecified: Secondary | ICD-10-CM

## 2017-10-20 DIAGNOSIS — Z9221 Personal history of antineoplastic chemotherapy: Secondary | ICD-10-CM | POA: Insufficient documentation

## 2017-10-20 DIAGNOSIS — C50911 Malignant neoplasm of unspecified site of right female breast: Secondary | ICD-10-CM | POA: Diagnosis not present

## 2017-10-20 DIAGNOSIS — M545 Low back pain: Secondary | ICD-10-CM | POA: Insufficient documentation

## 2017-10-20 DIAGNOSIS — Z79811 Long term (current) use of aromatase inhibitors: Secondary | ICD-10-CM

## 2017-10-20 DIAGNOSIS — G893 Neoplasm related pain (acute) (chronic): Secondary | ICD-10-CM | POA: Diagnosis not present

## 2017-10-20 DIAGNOSIS — Z923 Personal history of irradiation: Secondary | ICD-10-CM | POA: Diagnosis not present

## 2017-10-20 DIAGNOSIS — C7951 Secondary malignant neoplasm of bone: Secondary | ICD-10-CM

## 2017-10-20 DIAGNOSIS — Z8614 Personal history of Methicillin resistant Staphylococcus aureus infection: Secondary | ICD-10-CM | POA: Diagnosis not present

## 2017-10-20 DIAGNOSIS — Z5112 Encounter for antineoplastic immunotherapy: Secondary | ICD-10-CM | POA: Diagnosis not present

## 2017-10-20 DIAGNOSIS — Z23 Encounter for immunization: Secondary | ICD-10-CM | POA: Insufficient documentation

## 2017-10-20 DIAGNOSIS — Z79899 Other long term (current) drug therapy: Secondary | ICD-10-CM | POA: Insufficient documentation

## 2017-10-20 DIAGNOSIS — Z170 Estrogen receptor positive status [ER+]: Secondary | ICD-10-CM

## 2017-10-20 DIAGNOSIS — C50919 Malignant neoplasm of unspecified site of unspecified female breast: Secondary | ICD-10-CM

## 2017-10-20 DIAGNOSIS — F419 Anxiety disorder, unspecified: Secondary | ICD-10-CM | POA: Insufficient documentation

## 2017-10-20 DIAGNOSIS — C799 Secondary malignant neoplasm of unspecified site: Principal | ICD-10-CM

## 2017-10-20 DIAGNOSIS — Z95828 Presence of other vascular implants and grafts: Secondary | ICD-10-CM

## 2017-10-20 LAB — CBC WITH DIFFERENTIAL/PLATELET
Abs Immature Granulocytes: 0.02 10*3/uL (ref 0.00–0.07)
BASOS ABS: 0 10*3/uL (ref 0.0–0.1)
Basophils Relative: 0 %
EOS PCT: 4 %
Eosinophils Absolute: 0.1 10*3/uL (ref 0.0–0.5)
HCT: 32 % — ABNORMAL LOW (ref 36.0–46.0)
Hemoglobin: 11.2 g/dL — ABNORMAL LOW (ref 12.0–15.0)
IMMATURE GRANULOCYTES: 1 %
LYMPHS PCT: 21 %
Lymphs Abs: 0.7 10*3/uL (ref 0.7–4.0)
MCH: 33.3 pg (ref 26.0–34.0)
MCHC: 35 g/dL (ref 30.0–36.0)
MCV: 95.2 fL (ref 80.0–100.0)
Monocytes Absolute: 0.3 10*3/uL (ref 0.1–1.0)
Monocytes Relative: 10 %
NRBC: 0 % (ref 0.0–0.2)
Neutro Abs: 2.1 10*3/uL (ref 1.7–7.7)
Neutrophils Relative %: 64 %
PLATELETS: 108 10*3/uL — AB (ref 150–400)
RBC: 3.36 MIL/uL — ABNORMAL LOW (ref 3.87–5.11)
RDW: 12 % (ref 11.5–15.5)
WBC: 3.2 10*3/uL — AB (ref 4.0–10.5)

## 2017-10-20 LAB — COMPREHENSIVE METABOLIC PANEL
ALT: 17 U/L (ref 0–44)
ANION GAP: 11 (ref 5–15)
AST: 19 U/L (ref 15–41)
Albumin: 3.5 g/dL (ref 3.5–5.0)
Alkaline Phosphatase: 71 U/L (ref 38–126)
BUN: 9 mg/dL (ref 6–20)
CHLORIDE: 108 mmol/L (ref 98–111)
CO2: 24 mmol/L (ref 22–32)
CREATININE: 0.77 mg/dL (ref 0.44–1.00)
Calcium: 9.1 mg/dL (ref 8.9–10.3)
Glucose, Bld: 106 mg/dL — ABNORMAL HIGH (ref 70–99)
POTASSIUM: 3.5 mmol/L (ref 3.5–5.1)
Sodium: 143 mmol/L (ref 135–145)
Total Bilirubin: 2.9 mg/dL — ABNORMAL HIGH (ref 0.3–1.2)
Total Protein: 6.8 g/dL (ref 6.5–8.1)

## 2017-10-20 MED ORDER — FAMOTIDINE IN NACL 20-0.9 MG/50ML-% IV SOLN
20.0000 mg | Freq: Once | INTRAVENOUS | Status: AC
  Administered 2017-10-20: 20 mg via INTRAVENOUS

## 2017-10-20 MED ORDER — DEXAMETHASONE SODIUM PHOSPHATE 10 MG/ML IJ SOLN
INTRAMUSCULAR | Status: AC
  Filled 2017-10-20: qty 1

## 2017-10-20 MED ORDER — SODIUM CHLORIDE 0.9 % IV SOLN: 10.0000 mg | Freq: Once | INTRAVENOUS | Status: DC

## 2017-10-20 MED ORDER — PROCHLORPERAZINE MALEATE 10 MG PO TABS: 10.0000 mg | ORAL_TABLET | Freq: Four times a day (QID) | ORAL | 1 refills | Status: DC | PRN

## 2017-10-20 MED ORDER — SODIUM CHLORIDE 0.9 % IV SOLN
Freq: Once | INTRAVENOUS | Status: AC
  Administered 2017-10-20: 10:00:00 via INTRAVENOUS
  Filled 2017-10-20: qty 250

## 2017-10-20 MED ORDER — ACETAMINOPHEN 325 MG PO TABS
650.0000 mg | ORAL_TABLET | Freq: Once | ORAL | Status: AC
  Administered 2017-10-20: 650 mg via ORAL

## 2017-10-20 MED ORDER — SODIUM CHLORIDE 0.9% FLUSH
10.0000 mL | INTRAVENOUS | Status: DC | PRN
  Administered 2017-10-20: 10 mL
  Filled 2017-10-20: qty 10

## 2017-10-20 MED ORDER — TRASTUZUMAB CHEMO 150 MG IV SOLR
357.0000 mg | Freq: Once | INTRAVENOUS | Status: AC
  Administered 2017-10-20: 357 mg via INTRAVENOUS
  Filled 2017-10-20: qty 17

## 2017-10-20 MED ORDER — HEPARIN SOD (PORK) LOCK FLUSH 100 UNIT/ML IV SOLN
500.0000 [IU] | Freq: Once | INTRAVENOUS | Status: AC | PRN
  Administered 2017-10-20: 500 [IU]
  Filled 2017-10-20: qty 5

## 2017-10-20 MED ORDER — ACETAMINOPHEN 325 MG PO TABS
ORAL_TABLET | ORAL | Status: AC
  Filled 2017-10-20: qty 2

## 2017-10-20 MED ORDER — SODIUM CHLORIDE 0.9 % IV SOLN
80.0000 mg/m2 | Freq: Once | INTRAVENOUS | Status: DC
  Filled 2017-10-20: qty 22

## 2017-10-20 MED ORDER — INFLUENZA VAC SPLIT QUAD 0.5 ML IM SUSY
PREFILLED_SYRINGE | INTRAMUSCULAR | Status: AC
  Filled 2017-10-20: qty 0.5

## 2017-10-20 MED ORDER — DEXAMETHASONE SODIUM PHOSPHATE 10 MG/ML IJ SOLN
10.0000 mg | Freq: Once | INTRAMUSCULAR | Status: AC
  Administered 2017-10-20: 10 mg via INTRAVENOUS

## 2017-10-20 MED ORDER — ONDANSETRON HCL 8 MG PO TABS: 8.0000 mg | ORAL_TABLET | Freq: Two times a day (BID) | ORAL | 1 refills | Status: DC | PRN

## 2017-10-20 MED ORDER — SODIUM CHLORIDE 0.9 % IV SOLN
420.0000 mg | Freq: Once | INTRAVENOUS | Status: AC
  Administered 2017-10-20: 420 mg via INTRAVENOUS
  Filled 2017-10-20: qty 14

## 2017-10-20 MED ORDER — INFLUENZA VAC SPLIT QUAD 0.5 ML IM SUSY
0.5000 mL | PREFILLED_SYRINGE | Freq: Once | INTRAMUSCULAR | Status: AC
  Administered 2017-10-20: 0.5 mL via INTRAMUSCULAR

## 2017-10-20 MED ORDER — LORATADINE 10 MG PO TABS
ORAL_TABLET | ORAL | Status: AC
  Filled 2017-10-20: qty 1

## 2017-10-20 MED ORDER — ZOLEDRONIC ACID 4 MG/100ML IV SOLN
4.0000 mg | Freq: Once | INTRAVENOUS | Status: AC
  Administered 2017-10-20: 4 mg via INTRAVENOUS
  Filled 2017-10-20: qty 100

## 2017-10-20 MED ORDER — LORATADINE 10 MG PO TABS
10.0000 mg | ORAL_TABLET | Freq: Every day | ORAL | Status: DC
  Administered 2017-10-20: 10 mg via ORAL

## 2017-10-20 MED ORDER — FAMOTIDINE IN NACL 20-0.9 MG/50ML-% IV SOLN
INTRAVENOUS | Status: AC
  Filled 2017-10-20: qty 50

## 2017-10-20 NOTE — Progress Notes (Signed)
Taxol will be moved to tomorrow since pt cannot stay for the infusion today. Dr. Burr Medico notified.  Orders moved. Kennith Center, Pharm.D., CPP 10/20/2017@1 :12 PM

## 2017-10-20 NOTE — Telephone Encounter (Signed)
Reprinted patient calender due to some rescheduling that was need to be done. 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 Her 8 hr infusion must be broken down and Burr Medico is aware of the patient situation. Per 10/9 los

## 2017-10-20 NOTE — Patient Instructions (Signed)

## 2017-10-20 NOTE — Progress Notes (Signed)
Doses for Herceptin & Perjeta clarified w/ Dr. Burr Medico. Herceptin dose will be 6 mg/kg today since last dose given 09/21/17.  No reload necessary. Perjeta dose will start back at 420 mg today rather than 840 mg load given pt h/o diarrhea w/ previous Perjeta tx.  Pt required 50% of maintenance dose previously. Both drugs will be infused over 30 minutes since she has received them in the past. Kennith Center, Pharm.D., CPP 10/20/2017@10 :42 AM

## 2017-10-20 NOTE — Telephone Encounter (Signed)
Printed avs and calender of upcoming appointment. Per 10/9 los 

## 2017-10-20 NOTE — Patient Instructions (Addendum)
BE SURE YOU ARE TAKING TWO OF YOUR 10MEQ POTASSIUM PILLS DAILY.   [facility] Discharge Instructions for Patients Receiving Chemotherapy  Today you received the following chemotherapy agents Herceptin, Perjeta, Taxol.  To help prevent nausea and vomiting after your treatment, we encourage you to take your nausea medication as prescribed.   If you develop nausea and vomiting that is not controlled by your nausea medication, call the clinic.   BELOW ARE SYMPTOMS THAT SHOULD BE REPORTED IMMEDIATELY:  *FEVER GREATER THAN 100.5 F  *CHILLS WITH OR WITHOUT FEVER  NAUSEA AND VOMITING THAT IS NOT CONTROLLED WITH YOUR NAUSEA MEDICATION  *UNUSUAL SHORTNESS OF BREATH  *UNUSUAL BRUISING OR BLEEDING  TENDERNESS IN MOUTH AND THROAT WITH OR WITHOUT PRESENCE OF ULCERS  *URINARY PROBLEMS  *BOWEL PROBLEMS  UNUSUAL RASH Items with * indicate a potential emergency and should be followed up as soon as possible.  Feel free to call the clinic should you have any questions or concerns. The clinic phone number is [phone].  Please show the [place] at check-in to the Emergency Department and triage nurse.    Trastuzumab injection for infusion What is this medicine? TRASTUZUMAB (tras TOO zoo mab) is a monoclonal antibody. It is used to treat breast cancer and stomach cancer. This medicine may be used for other purposes; ask your health care provider or pharmacist if you have questions. COMMON BRAND NAME(S): Herceptin What should I tell my health care provider before I take this medicine? They need to know if you have any of these conditions: -heart disease -heart failure -lung or breathing disease, like asthma -an unusual or allergic reaction to trastuzumab, benzyl alcohol, or other medications, foods, dyes, or preservatives -pregnant or trying to get pregnant -breast-feeding How should I use this medicine? This drug is given as an infusion into a vein. It is  administered in a hospital or clinic by a specially trained health care professional. Talk to your pediatrician regarding the use of this medicine in children. This medicine is not approved for use in children. Overdosage: If you think you have taken too much of this medicine contact a poison control center or emergency room at once. NOTE: This medicine is only for you. Do not share this medicine with others. What if I miss a dose? It is important not to miss a dose. Call your doctor or health care professional if you are unable to keep an appointment. What may interact with this medicine? This medicine may interact with the following medications: -certain types of chemotherapy, such as daunorubicin, doxorubicin, epirubicin, and idarubicin This list may not describe all possible interactions. Give your health care provider a list of all the medicines, herbs, non-prescription drugs, or dietary supplements you use. Also tell them if you smoke, drink alcohol, or use illegal drugs. Some items may interact with your medicine. What should I watch for while using this medicine? Visit your doctor for checks on your progress. Report any side effects. Continue your course of treatment even though you feel ill unless your doctor tells you to stop. Call your doctor or health care professional for advice if you get a fever, chills or sore throat, or other symptoms of a cold or flu. Do not treat yourself. Try to avoid being around people who are sick. You may experience fever, chills and shaking during your first infusion. These effects are usually mild and can be treated with other medicines. Report any side effects during the infusion to your  health care professional. Fever and chills usually do not happen with later infusions. Do not become pregnant while taking this medicine or for 7 months after stopping it. Women should inform their doctor if they wish to become pregnant or think they might be pregnant. Women  of child-bearing potential will need to have a negative pregnancy test before starting this medicine. There is a potential for serious side effects to an unborn child. Talk to your health care professional or pharmacist for more information. Do not breast-feed an infant while taking this medicine or for 7 months after stopping it. Women must use effective birth control with this medicine. What side effects may I notice from receiving this medicine? Side effects that you should report to your doctor or health care professional as soon as possible: -allergic reactions like skin rash, itching or hives, swelling of the face, lips, or tongue -chest pain or palpitations -cough -dizziness -feeling faint or lightheaded, falls -fever -general ill feeling or flu-like symptoms -signs of worsening heart failure like breathing problems; swelling in your legs and feet -unusually weak or tired Side effects that usually do not require medical attention (report to your doctor or health care professional if they continue or are bothersome): -bone pain -changes in taste -diarrhea -joint pain -nausea/vomiting -weight loss This list may not describe all possible side effects. Call your doctor for medical advice about side effects. You may report side effects to FDA at 1-800-FDA-1088. Where should I keep my medicine? This drug is given in a hospital or clinic and will not be stored at home. NOTE: This sheet is a summary. It may not cover all possible information. If you have questions about this medicine, talk to your doctor, pharmacist, or health care provider.  2018 [employer]/Gold Standard (2015-12-24 14:37:52)  Pertuzumab injection What is this medicine? PERTUZUMAB (per TOOZ ue mab) is a monoclonal antibody. It is used to treat breast cancer. This medicine may be used for other purposes; ask your health care provider or pharmacist if you have questions. COMMON BRAND NAME(S): PERJETA What should I tell my  health care provider before I take this medicine? They need to know if you have any of these conditions: -heart disease -heart failure -high blood pressure -history of irregular heart beat -recent or ongoing radiation therapy -an unusual or allergic reaction to pertuzumab, other medicines, foods, dyes, or preservatives -pregnant or trying to get pregnant -breast-feeding How should I use this medicine? This medicine is for infusion into a vein. It is given by a health care professional in a hospital or clinic setting. Talk to your pediatrician regarding the use of this medicine in children. Special care may be needed. Overdosage: If you think you have taken too much of this medicine contact a poison control center or emergency room at once. NOTE: This medicine is only for you. Do not share this medicine with others. What if I miss a dose? It is important not to miss your dose. Call your doctor or health care professional if you are unable to keep an appointment. What may interact with this medicine? Interactions are not expected. Give your health care provider a list of all the medicines, herbs, non-prescription drugs, or dietary supplements you use. Also tell them if you smoke, drink alcohol, or use illegal drugs. Some items may interact with your medicine. This list may not describe all possible interactions. Give your health care provider a list of all the medicines, herbs, non-prescription drugs, or dietary supplements you use. Also  tell them if you smoke, drink alcohol, or use illegal drugs. Some items may interact with your medicine. What should I watch for while using this medicine? Your condition will be monitored carefully while you are receiving this medicine. Report any side effects. Continue your course of treatment even though you feel ill unless your doctor tells you to stop. Do not become pregnant while taking this medicine or for 7 months after stopping it. Women should inform  their doctor if they wish to become pregnant or think they might be pregnant. Women of child-bearing potential will need to have a negative pregnancy test before starting this medicine. There is a potential for serious side effects to an unborn child. Talk to your health care professional or pharmacist for more information. Do not breast-feed an infant while taking this medicine or for 7 months after stopping it. Women must use effective birth control with this medicine. Call your doctor or health care professional for advice if you get a fever, chills or sore throat, or other symptoms of a cold or flu. Do not treat yourself. Try to avoid being around people who are sick. You may experience fever, chills, and headache during the infusion. Report any side effects during the infusion to your health care professional. What side effects may I notice from receiving this medicine? Side effects that you should report to your doctor or health care professional as soon as possible: -breathing problems -chest pain or palpitations -dizziness -feeling faint or lightheaded -fever or chills -skin rash, itching or hives -sore throat -swelling of the face, lips, or tongue -swelling of the legs or ankles -unusually weak or tired Side effects that usually do not require medical attention (report to your doctor or health care professional if they continue or are bothersome): -diarrhea -hair loss -nausea, vomiting -tiredness This list may not describe all possible side effects. Call your doctor for medical advice about side effects. You may report side effects to FDA at 1-800-FDA-1088. Where should I keep my medicine? This drug is given in a hospital or clinic and will not be stored at home. NOTE: This sheet is a summary. It may not cover all possible information. If you have questions about this medicine, talk to your doctor, pharmacist, or health care provider.  2018 Elsevier/Gold Standard (2015-01-31  12:08:50)  Paclitaxel injection What is this medicine? PACLITAXEL (PAK li TAX el) is a chemotherapy drug. It targets fast dividing cells, like cancer cells, and causes these cells to die. This medicine is used to treat ovarian cancer, breast cancer, and other cancers. This medicine may be used for other purposes; ask your health care provider or pharmacist if you have questions. COMMON BRAND NAME(S): Onxol, Taxol What should I tell my health care provider before I take this medicine? They need to know if you have any of these conditions: -blood disorders -irregular heartbeat -infection (especially a virus infection such as chickenpox, cold sores, or herpes) -liver disease -previous or ongoing radiation therapy -an unusual or allergic reaction to paclitaxel, alcohol, polyoxyethylated castor oil, other chemotherapy agents, other medicines, foods, dyes, or preservatives -pregnant or trying to get pregnant -breast-feeding How should I use this medicine? This drug is given as an infusion into a vein. It is administered in a hospital or clinic by a specially trained health care professional. Talk to your pediatrician regarding the use of this medicine in children. Special care may be needed. Overdosage: If you think you have taken too much of this medicine contact a  poison control center or emergency room at once. NOTE: This medicine is only for you. Do not share this medicine with others. What if I miss a dose? It is important not to miss your dose. Call your doctor or health care professional if you are unable to keep an appointment. What may interact with this medicine? Do not take this medicine with any of the following medications: -disulfiram -metronidazole This medicine may also interact with the following medications: -cyclosporine -diazepam -ketoconazole -medicines to increase blood counts like filgrastim, pegfilgrastim, sargramostim -other chemotherapy drugs like cisplatin,  doxorubicin, epirubicin, etoposide, teniposide, vincristine -quinidine -testosterone -vaccines -verapamil Talk to your doctor or health care professional before taking any of these medicines: -acetaminophen -aspirin -ibuprofen -ketoprofen -naproxen This list may not describe all possible interactions. Give your health care provider a list of all the medicines, herbs, non-prescription drugs, or dietary supplements you use. Also tell them if you smoke, drink alcohol, or use illegal drugs. Some items may interact with your medicine. What should I watch for while using this medicine? Your condition will be monitored carefully while you are receiving this medicine. You will need important blood work done while you are taking this medicine. This medicine can cause serious allergic reactions. To reduce your risk you will need to take other medicine(s) before treatment with this medicine. If you experience allergic reactions like skin rash, itching or hives, swelling of the face, lips, or tongue, tell your doctor or health care professional right away. In some cases, you may be given additional medicines to help with side effects. Follow all directions for their use. This drug may make you feel generally unwell. This is not uncommon, as chemotherapy can affect healthy cells as well as cancer cells. Report any side effects. Continue your course of treatment even though you feel ill unless your doctor tells you to stop. Call your doctor or health care professional for advice if you get a fever, chills or sore throat, or other symptoms of a cold or flu. Do not treat yourself. This drug decreases your body's ability to fight infections. Try to avoid being around people who are sick. This medicine may increase your risk to bruise or bleed. Call your doctor or health care professional if you notice any unusual bleeding. Be careful brushing and flossing your teeth or using a toothpick because you may get an  infection or bleed more easily. If you have any dental work done, tell your dentist you are receiving this medicine. Avoid taking products that contain aspirin, acetaminophen, ibuprofen, naproxen, or ketoprofen unless instructed by your doctor. These medicines may hide a fever. Do not become pregnant while taking this medicine. Women should inform their doctor if they wish to become pregnant or think they might be pregnant. There is a potential for serious side effects to an unborn child. Talk to your health care professional or pharmacist for more information. Do not breast-feed an infant while taking this medicine. Men are advised not to father a child while receiving this medicine. This product may contain alcohol. Ask your pharmacist or healthcare provider if this medicine contains alcohol. Be sure to tell all healthcare providers you are taking this medicine. Certain medicines, like metronidazole and disulfiram, can cause an unpleasant reaction when taken with alcohol. The reaction includes flushing, headache, nausea, vomiting, sweating, and increased thirst. The reaction can last from 30 minutes to several hours. What side effects may I notice from receiving this medicine? Side effects that you should report to your  doctor or health care professional as soon as possible: -allergic reactions like skin rash, itching or hives, swelling of the face, lips, or tongue -low blood counts - This drug may decrease the number of white blood cells, red blood cells and platelets. You may be at increased risk for infections and bleeding. -signs of infection - fever or chills, cough, sore throat, pain or difficulty passing urine -signs of decreased platelets or bleeding - bruising, pinpoint red spots on the skin, black, tarry stools, nosebleeds -signs of decreased red blood cells - unusually weak or tired, fainting spells, lightheadedness -breathing problems -chest pain -high or low blood pressure -mouth  sores -nausea and vomiting -pain, swelling, redness or irritation at the injection site -pain, tingling, numbness in the hands or feet -slow or irregular heartbeat -swelling of the ankle, feet, hands Side effects that usually do not require medical attention (report to your doctor or health care professional if they continue or are bothersome): -bone pain -complete hair loss including hair on your head, underarms, pubic hair, eyebrows, and eyelashes -changes in the color of fingernails -diarrhea -loosening of the fingernails -loss of appetite -muscle or joint pain -red flush to skin -sweating This list may not describe all possible side effects. Call your doctor for medical advice about side effects. You may report side effects to FDA at 1-800-FDA-1088. Where should I keep my medicine? This drug is given in a hospital or clinic and will not be stored at home. NOTE: This sheet is a summary. It may not cover all possible information. If you have questions about this medicine, talk to your doctor, pharmacist, or health care provider.  2018 [employer]/Gold Standard (2014-10-30 19:58:00)

## 2017-10-21 ENCOUNTER — Inpatient Hospital Stay: Payer: Medicaid Other

## 2017-10-21 ENCOUNTER — Ambulatory Visit: Payer: Medicaid Other

## 2017-10-21 VITALS — BP 121/69 | HR 70 | Temp 97.6°F | Resp 17

## 2017-10-21 DIAGNOSIS — G4700 Insomnia, unspecified: Secondary | ICD-10-CM | POA: Diagnosis not present

## 2017-10-21 DIAGNOSIS — G893 Neoplasm related pain (acute) (chronic): Secondary | ICD-10-CM | POA: Diagnosis not present

## 2017-10-21 DIAGNOSIS — M545 Low back pain: Secondary | ICD-10-CM | POA: Diagnosis not present

## 2017-10-21 DIAGNOSIS — Z8614 Personal history of Methicillin resistant Staphylococcus aureus infection: Secondary | ICD-10-CM | POA: Diagnosis not present

## 2017-10-21 DIAGNOSIS — C7951 Secondary malignant neoplasm of bone: Principal | ICD-10-CM

## 2017-10-21 DIAGNOSIS — E876 Hypokalemia: Secondary | ICD-10-CM | POA: Diagnosis not present

## 2017-10-21 DIAGNOSIS — Z79811 Long term (current) use of aromatase inhibitors: Secondary | ICD-10-CM | POA: Diagnosis not present

## 2017-10-21 DIAGNOSIS — H905 Unspecified sensorineural hearing loss: Secondary | ICD-10-CM | POA: Diagnosis not present

## 2017-10-21 DIAGNOSIS — Z170 Estrogen receptor positive status [ER+]: Secondary | ICD-10-CM | POA: Diagnosis not present

## 2017-10-21 DIAGNOSIS — Z9221 Personal history of antineoplastic chemotherapy: Secondary | ICD-10-CM | POA: Diagnosis not present

## 2017-10-21 DIAGNOSIS — Z5112 Encounter for antineoplastic immunotherapy: Secondary | ICD-10-CM | POA: Diagnosis not present

## 2017-10-21 DIAGNOSIS — C50911 Malignant neoplasm of unspecified site of right female breast: Secondary | ICD-10-CM | POA: Diagnosis not present

## 2017-10-21 DIAGNOSIS — Z23 Encounter for immunization: Secondary | ICD-10-CM | POA: Diagnosis not present

## 2017-10-21 DIAGNOSIS — Z923 Personal history of irradiation: Secondary | ICD-10-CM | POA: Diagnosis not present

## 2017-10-21 DIAGNOSIS — F419 Anxiety disorder, unspecified: Secondary | ICD-10-CM | POA: Diagnosis not present

## 2017-10-21 DIAGNOSIS — Z79899 Other long term (current) drug therapy: Secondary | ICD-10-CM | POA: Diagnosis not present

## 2017-10-21 DIAGNOSIS — D649 Anemia, unspecified: Secondary | ICD-10-CM | POA: Diagnosis not present

## 2017-10-21 MED ORDER — FAMOTIDINE IN NACL 20-0.9 MG/50ML-% IV SOLN
20.0000 mg | Freq: Once | INTRAVENOUS | Status: AC
  Administered 2017-10-21: 20 mg via INTRAVENOUS

## 2017-10-21 MED ORDER — FAMOTIDINE IN NACL 20-0.9 MG/50ML-% IV SOLN
INTRAVENOUS | Status: AC
  Filled 2017-10-21: qty 50

## 2017-10-21 MED ORDER — DEXAMETHASONE SODIUM PHOSPHATE 10 MG/ML IJ SOLN
INTRAMUSCULAR | Status: AC
  Filled 2017-10-21: qty 1

## 2017-10-21 MED ORDER — SODIUM CHLORIDE 0.9 % IV SOLN
60.0000 mg/m2 | Freq: Once | INTRAVENOUS | Status: AC
  Administered 2017-10-21: 96 mg via INTRAVENOUS
  Filled 2017-10-21: qty 16

## 2017-10-21 MED ORDER — SODIUM CHLORIDE 0.9 % IV SOLN
Freq: Once | INTRAVENOUS | Status: AC
  Administered 2017-10-21: 08:00:00 via INTRAVENOUS
  Filled 2017-10-21: qty 250

## 2017-10-21 MED ORDER — DEXAMETHASONE SODIUM PHOSPHATE 10 MG/ML IJ SOLN
10.0000 mg | Freq: Once | INTRAMUSCULAR | Status: AC
  Administered 2017-10-21: 10 mg via INTRAVENOUS

## 2017-10-21 MED ORDER — HEPARIN SOD (PORK) LOCK FLUSH 100 UNIT/ML IV SOLN
500.0000 [IU] | Freq: Once | INTRAVENOUS | Status: AC | PRN
  Administered 2017-10-21: 500 [IU]
  Filled 2017-10-21: qty 5

## 2017-10-21 MED ORDER — SODIUM CHLORIDE 0.9% FLUSH
10.0000 mL | INTRAVENOUS | Status: DC | PRN
  Administered 2017-10-21: 10 mL
  Filled 2017-10-21: qty 10

## 2017-10-21 NOTE — Patient Instructions (Signed)
[facility] Discharge Instructions for Patients Receiving Chemotherapy  Today you received the following chemotherapy agents Taxol  To help prevent nausea and vomiting after your treatment, we encourage you to take your nausea medication as directed   If you develop nausea and vomiting that is not controlled by your nausea medication, call the clinic.   BELOW ARE SYMPTOMS THAT SHOULD BE REPORTED IMMEDIATELY:  *FEVER GREATER THAN 100.5 F  *CHILLS WITH OR WITHOUT FEVER  NAUSEA AND VOMITING THAT IS NOT CONTROLLED WITH YOUR NAUSEA MEDICATION  *UNUSUAL SHORTNESS OF BREATH  *UNUSUAL BRUISING OR BLEEDING  TENDERNESS IN MOUTH AND THROAT WITH OR WITHOUT PRESENCE OF ULCERS  *URINARY PROBLEMS  *BOWEL PROBLEMS  UNUSUAL RASH Items with * indicate a potential emergency and should be followed up as soon as possible.  Feel free to call the clinic should you have any questions or concerns. The clinic phone number is [phone].  Please show the CHEMO ALERT CARD at check-in to the Emergency Department and triage nurse.   Paclitaxel injection What is this medicine? PACLITAXEL (PAK li TAX el) is a chemotherapy drug. It targets fast dividing cells, like cancer cells, and causes these cells to die. This medicine is used to treat ovarian cancer, breast cancer, and other cancers. This medicine may be used for other purposes; ask your health care provider or pharmacist if you have questions. COMMON BRAND NAME(S): Onxol, Taxol What should I tell my health care provider before I take this medicine? They need to know if you have any of these conditions: -blood disorders -irregular heartbeat -infection (especially a virus infection such as chickenpox, cold sores, or herpes) -liver disease -previous or ongoing radiation therapy -an unusual or allergic reaction to paclitaxel, alcohol, polyoxyethylated castor oil, other chemotherapy agents, other medicines, foods, dyes, or  preservatives -pregnant or trying to get pregnant -breast-feeding How should I use this medicine? This drug is given as an infusion into a vein. It is administered in a hospital or clinic by a specially trained health care professional. Talk to your pediatrician regarding the use of this medicine in children. Special care may be needed. Overdosage: If you think you have taken too much of this medicine contact a poison control center or emergency room at once. NOTE: This medicine is only for you. Do not share this medicine with others. What if I miss a dose? It is important not to miss your dose. Call your doctor or health care professional if you are unable to keep an appointment. What may interact with this medicine? Do not take this medicine with any of the following medications: -disulfiram -metronidazole This medicine may also interact with the following medications: -cyclosporine -diazepam -ketoconazole -medicines to increase blood counts like filgrastim, pegfilgrastim, sargramostim -other chemotherapy drugs like cisplatin, doxorubicin, epirubicin, etoposide, teniposide, vincristine -quinidine -testosterone -vaccines -verapamil Talk to your doctor or health care professional before taking any of these medicines: -acetaminophen -aspirin -ibuprofen -ketoprofen -naproxen This list may not describe all possible interactions. Give your health care provider a list of all the medicines, herbs, non-prescription drugs, or dietary supplements you use. Also tell them if you smoke, drink alcohol, or use illegal drugs. Some items may interact with your medicine. What should I watch for while using this medicine? Your condition will be monitored carefully while you are receiving this medicine. You will need important blood work done while you are taking this medicine. This medicine can cause serious allergic reactions. To reduce your risk you will need to   take other medicine(s) before  treatment with this medicine. If you experience allergic reactions like skin rash, itching or hives, swelling of the face, lips, or tongue, tell your doctor or health care professional right away. In some cases, you may be given additional medicines to help with side effects. Follow all directions for their use. This drug may make you feel generally unwell. This is not uncommon, as chemotherapy can affect healthy cells as well as cancer cells. Report any side effects. Continue your course of treatment even though you feel ill unless your doctor tells you to stop. Call your doctor or health care professional for advice if you get a fever, chills or sore throat, or other symptoms of a cold or flu. Do not treat yourself. This drug decreases your body's ability to fight infections. Try to avoid being around people who are sick. This medicine may increase your risk to bruise or bleed. Call your doctor or health care professional if you notice any unusual bleeding. Be careful brushing and flossing your teeth or using a toothpick because you may get an infection or bleed more easily. If you have any dental work done, tell your dentist you are receiving this medicine. Avoid taking products that contain aspirin, acetaminophen, ibuprofen, naproxen, or ketoprofen unless instructed by your doctor. These medicines may hide a fever. Do not become pregnant while taking this medicine. Women should inform their doctor if they wish to become pregnant or think they might be pregnant. There is a potential for serious side effects to an unborn child. Talk to your health care professional or pharmacist for more information. Do not breast-feed an infant while taking this medicine. Men are advised not to father a child while receiving this medicine. This product may contain alcohol. Ask your pharmacist or healthcare provider if this medicine contains alcohol. Be sure to tell all healthcare providers you are taking this medicine.  Certain medicines, like metronidazole and disulfiram, can cause an unpleasant reaction when taken with alcohol. The reaction includes flushing, headache, nausea, vomiting, sweating, and increased thirst. The reaction can last from 30 minutes to several hours. What side effects may I notice from receiving this medicine? Side effects that you should report to your doctor or health care professional as soon as possible: -allergic reactions like skin rash, itching or hives, swelling of the face, lips, or tongue -low blood counts - This drug may decrease the number of white blood cells, red blood cells and platelets. You may be at increased risk for infections and bleeding. -signs of infection - fever or chills, cough, sore throat, pain or difficulty passing urine -signs of decreased platelets or bleeding - bruising, pinpoint red spots on the skin, black, tarry stools, nosebleeds -signs of decreased red blood cells - unusually weak or tired, fainting spells, lightheadedness -breathing problems -chest pain -high or low blood pressure -mouth sores -nausea and vomiting -pain, swelling, redness or irritation at the injection site -pain, tingling, numbness in the hands or feet -slow or irregular heartbeat -swelling of the ankle, feet, hands Side effects that usually do not require medical attention (report to your doctor or health care professional if they continue or are bothersome): -bone pain -complete hair loss including hair on your head, underarms, pubic hair, eyebrows, and eyelashes -changes in the color of fingernails -diarrhea -loosening of the fingernails -loss of appetite -muscle or joint pain -red flush to skin -sweating This list may not describe all possible side effects. Call your doctor for medical advice about   side effects. You may report side effects to FDA at 1-800-FDA-1088. Where should I keep my medicine? This drug is given in a hospital or clinic and will not be stored at  home. NOTE: This sheet is a summary. It may not cover all possible information. If you have questions about this medicine, talk to your doctor, pharmacist, or health care provider.  2018 [employer] (2014-10-30 19:58:00)   

## 2017-10-21 NOTE — Progress Notes (Signed)
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       [name], right ([place])   11/1997 Cancer Diagnosis    Patient had 1.4 cm poorly differentiated right breast carcinoma diagnosed in Nov 1999 at age 34, 1/7 axillary nodes involved, ER/PR and HER 2 positive. She had mastectomy with the 7 axillary node evaluation, 4 cycles of adriamycin/ cytoxan followed by taxotere, then five years of tamoxifen thru June 2005 (no herceptin in 1999). She had bilateral oophorectomy in May 2005. She was briefly on aromatase inhibitor after tamoxifen, but discontinued this herself due to poor tolerance.    04/04/2015 Genetic Testing    Negative genetic testing on the breast/ovarian cancer pnael.  The Breast/Ovarian gene panel offered by [facility] includes sequencing and rearrangement analysis for the following 20 genes:  ATM, BARD1, BRCA1, BRCA2, BRIP1, CDH1, CHEK2, EPCAM, FANCC, MLH1, MSH2, MSH6, NBN, PALB2, PMS2, PTEN, RAD51C, RAD51D, TP53, and XRCC2.    06/26/2015 Pathology Results    Initial Path Report Bone, curettage, Left femur met, intramedullary subtroch tissue - METASTATIC ADENOCARCINOMA.  Estrogen Receptor: 95%, POSITIVE, STRONG STAINING INTENSITY Progesterone Receptor: 2%, POSITIVE, STRONG  HER2 (+), IHC 3+    06/26/2015 Surgery    Biopsy of metastatic tissue and stabilization with Affixus trochanteric nail, proximal and distal interlock of left femur by Dr. [name]     06/28/2015 Imaging    CT Chest w/ Contrast 1. Extensive right internal mammary chain  lymphadenopathy consistent with metastatic breast cancer. 2. Lytic metastases involving the posterior elements at T1 with probable nondisplaced pathologic fracture of the spinous process. 3. No other evidence of thoracic metastatic disease. 4. Trace bilateral pleural effusions with associated bibasilar atelectasis. 5. Indeterminate right thyroid nodule, not previously imaged. This could be evaluated with thyroid ultrasound as clinically warranted.    07/01/2015 Imaging    Bone Scan 1. Increased activity noted throughout the left femur. Although metastatic disease cannot be excluded, these changes are most likely secondary to prior surgery. 2. No other focal abnormalities identified to suggest metastatic disease.    07/12/2015 - 07/26/2015 Radiation Therapy    Left femur, 30 Gy in 10 fractions by Dr. Sondra Come     07/14/2015 Initial Diagnosis    [name], right ([place])    07/19/2015 Imaging    Initial PET Scan 1. Severe multifocal osseous metastatic disease, much greater than anticipated based on prior imaging. 2. Multifocal nodal metastases involving the right internal mammary, prevascular and subpectoral lymph nodes. No axillary pulmonary involvement identified. 3. No distant extra osseous metastases.    07/30/2015 - 11/29/2015 Chemotherapy    Docetaxel, Herceptin and Perjeta every 3 weeks X6 cycles, pt tolerated moderately well, restaging scan showed excellent response     09/18/2015 Imaging    CT Head w/wo Contrast 1. No acute intracranial abnormality or significant interval change. 2. Stable minimal periventricular white matter hypoattenuation on the right. This may reflect a remote ischemic injury. 3. No evidence for metastatic disease to the brain. 4. No focal soft tissue lesion to explain the patient's tenderness.  10/30/2015 Imaging    CT Abdomen Pelvis w/ Contrast 1. Few mildly prominent fluid-filled loops of small bowel scattered throughout the abdomen,  with intraluminal fluid density within the distal colon. Given the provided history, findings are suggestive of acute enteritis/diarrheal illness. 2. No other acute intra-abdominal or pelvic process identified. 3. Widespread osseous metastatic disease, better evaluated on most recent PET-CT from 07/19/2015. No pathologic fracture or other complication. 4. 11 mm hypodensity within the left kidney. This lesion measures intermediate density, and is indeterminate. While this lesion is similar in size relative to recent studies, this is increased in size relative to prior study from 2012. Further evaluation with dedicated renal mass protocol CT and/or MRI is recommended for complete characterization.    12/2015 - 05/14/2016 Chemotherapy    Maintenance Herceptin and pejeta every 3 weeks stopped on 05/14/16 due to disease progression    12/25/2015 Imaging    Restaging PET Scan 1. Markedly improved skeletal activity, with only several faint foci of residual accentuated metabolic activity, but resolution of the vast majority of the previously extensive osseous metastatic disease. 2. Resolution of the prior right internal mammary and right prevascular lymph nodes. 3. Residual subcutaneous edema and edema along fascia planes in the left upper thigh. This remains somewhat more than I would expect for placement of an IM nail 6 weeks ago. If there is leg swelling further down, consider Doppler venous ultrasound to rule out left lower extremity DVT. I do not perceive an obvious difference in density between the pelvic and common femoral veins on the noncontrast CT data. 4. Chronic right maxillary and right sphenoid sinusitis.    01/16/2016 -  Anti-estrogen oral therapy    Letrozole 2.5 mg daily. She was switched to exemestane due to disease progression on 02/08/17.    03/01/2016 Miscellaneous    Patient presented to ED following a fall; she reports she landed on her back and is now experiencing back pain. The patient  was evaluated and discharge home same day with pain control.    05/01/2016 Imaging    CT CAP IMPRESSION: 1. Stable exam.  No new or progressive disease identified. 2. No evidence for residual or recurrent adenopathy within the chest. 3. Stable bone metastasis.    05/01/2016 Imaging    BONE SCAN IMPRESSION: 1. Small focal uptake involving the anterior rib ends of the left second and right fourth ribs, new since the prior bone scan. The location of this uptake is more suggestive of a traumatic or inflammatory etiology as opposed to metastatic disease. No other evidence of metastatic disease. 2. There are other areas of stable uptake which are most likely degenerative/reactive, including right shoulder and cervical spine uptake and uptake along the right femur adjacent to the ORIF hardware.    05/20/2016 Imaging    NM PET Skull to Thigh IMPRESSION: 1. There multiple metastatic foci in the bony pelvis, including some new abnormal foci compare to the prior PET-CT, compatible with mildly progressive bony metastatic disease. Also, a left T11 vertebral hypermetabolic metastatic lesion is observed, new compared to the prior exam. 2. No active extraosseous malignancy is currently identified. 3. Right anterior fourth rib healing fracture, appears be    06/04/2016 - 01/08/2017 Chemotherapy    Second line chemotherapy Kadcyla every 3 weeks started on 06/04/16 and stopped on 01/08/17 due to mixed response.      10/05/2016 Imaging    CT CAP and Bone Scan:  Bones: left intramedullary rod and left femoral head neck screw in  place. In the proximal diaphysis of the left femur there is cortical thickening and irregularity. No lytic or blastic bone lesions. No fracture or vertebral endplate destruction.   No definite evidence of recurrent disease in the chest, abdomen, or pelvis.    10/28/2016 Imaging    CT A/P IMPRESSION: No acute process demonstrated in the abdomen or pelvis. No evidence of  bowel obstruction or inflammation.    11/04/2016 Imaging    DG foot complete left: IMPRESSION: No fracture or dislocation.  No soft tissue abnormality    11/23/2016 Imaging    CT RIGHT FEMUR IMPRESSION: Negative CT scan of the right thigh.  No visible metastatic disease.  CT LEFT FEMUR IMPRESSION: 1. Postsurgical changes related to prior cephalomedullary rod fixation of the left femur with linear lucency along the anterolateral proximal femoral cortex at level of the lesser trochanter, suspicious for nondisplaced fracture. 2. Slightly permeative appearance of the proximal left femoral cortex at the site of known prior osseous metastases is largely unchanged. 3. Unchanged patchy lucency and sclerosis along the anterior acetabulum, which appears to correspond with an area of faint uptake on the prior PET-CT, suspicious for osseous metastasis. These results will be called to the ordering clinician or representative by the Radiologist Assistant, and communication documented in the PACS or zVision Dashboard.      01/27/2017 Imaging    IMPRESSION: 1. Mixed response to osseous metastasis. Some hypermetabolic lesions are new and increasingly hypermetabolic, while another index lesion is decreasingly hypermetabolic. 2. No evidence of hypermetabolic soft tissue metastasis.     02/08/2017 -  Antibody Plan    -Exemestane 25 mg daily since 02/08/17 -Herceptin every 3 week since 02/08/17 -Oral Lanpantinib 1551m and decreased to 5058mdue to diarrhea and depression. Due to disease progression she swithced to Verzenio 15045mn 05/04/17. She did not toelrate well so she was switched to Ibrance 100m63m 05/31/17. Due to her neutropenia, Ibrance dose reduced to 75 mg daily, 3 weeks on, one-week off.     04/30/2017 PET scan    IMPRESSION: 1. Primarily increase in hypermetabolism of osseous metastasis. An isolated right acetabular lesion is decreased in hypermetabolism since the prior exam. 2.  No evidence of hypermetabolic soft tissue metastasis. 3. Similar non FDG avid right-sided thyroid nodule, favoring a benign etiology. Recommend attention on follow-up.    08/30/2017 PET scan    08/30/2017 PET Scan  IMPRESSION: 1. Worsening osseous metastatic disease. 2. Focal hypermetabolism in the central spinal canal the L1 level, similar to the prior exam. Metastatic implant cannot be excluded. 3. Slight enlargement of a right thyroid nodule which now appears more cystic in character. Consider further evaluation with thyroid ultrasound. If patient is clinically hyperthyroid, consider nuclear medicine thyroid uptake and scan    08/30/2017 Progression    08/30/2017 PET Scan  IMPRESSION: 1. Worsening osseous metastatic disease. 2. Focal hypermetabolism in the central spinal canal the L1 level, similar to the prior exam. Metastatic implant cannot be excluded. 3. Slight enlargement of a right thyroid nodule which now appears more cystic in character. Consider further evaluation with thyroid ultrasound. If patient is clinically hyperthyroid, consider nuclear medicine thyroid uptake and scan    09/29/2017 - 10/11/2017 Radiation Therapy    With Dr, KinaSondra Comerted 09/29/2017 Finished 10/11/2017    10/19/2017 -  Chemotherapy    -Exemestane 25mg59mly starting 02/08/17, stopped on 10/20/2017 when she started chemo  -Herceptin every 3 weeks starting 02/08/17, perjeta added on 10/20/2017  -weekly Taxol started on  10/21/2017      HISTORY OF PRESENTING ILLNESS (From Dr. Mariana Kaufman note on 02/06/2016):  [name] 52 y.o. 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 She has had an excellent response to 6 cycles of taxotere herceptin perjeta from 07-30-15 thru 11-29-15. She is continuing herceptin and perjeta every 3 weeks. She had extreme local pain with first xgeva injection on 01-16-16 and prefers  resuming previous zometa. She began letrozole ~ 01-16-16, has no complaints about this at all.  Last imaging was PET 12-25-15.  NOTE CTs, plain Xrays and bone scan did NOT show any of the extensively metastatic bone involvement 06-2015; MRI and PET did image disease clearly.  Patient continues to do very well overall. MRSA skin lesions on scalp and face improved quickly with doxycycline starting 01-16-16. She continues to use Dial soap and we have again strongly encouraged good hygiene with other family members at home. She has had the 3 lymphedema PT sessions allowed by insurance, continues to do massage for LLE herself, still has not gotten the ordered compression apparatus for home and will follow up with PT on that. Swelling in LLE has improved with the interventions and does not seem as uncomfortable. She denies any pain. Appetite is excellent, bowels fine, no SOB, good energy, no problems with PAC, no noted changes in left breast. No bleeding. No fever or symptoms of infection. She slipped in tub last week, fell onto right arm fortunately without injury. She was upset by a visitor and shaky then, but no tremors otherwise.   She has two children and 4 grandchildren. She has had genetic testing and it was negative. She lives at home with her husband. She is very active. Her children live close to her.   CURRENT THERAPY:    -Exemestane 74m daily starting 02/08/17, stopped on 10/20/2017 when she started chemo  -Herceptin every 3 weeks starting 02/08/17, perjeta added on 10/20/2017  -weekly Taxol started on 10/21/2017   INTERIM HISTORY:   WTYNESHIA STIVERS89y.o. female returns today for follow up. She is here with her husband and interpretor. She tolerates the first cycle chemotherapy last week well , and has been eating well. She feels fatigued, but is overall improving. She had diarrhea after taking antibiotics. She reports a new onset right lower abdominal pain that started Monday evening that is  different than the pain before radiation. She can walk, but slowly. She denies fever or nausea. Her BM are back to normal now.    MEDICAL HISTORY:  Past Medical History:  Diagnosis Date  . Abnormal Pap smear   . Anxiety   . [place]   [place]   breast-s/p mastectomy, chemo, rad  . CIN I (cervical intraepithelial neoplasia I) 2003   by colpo  . Congenital deafness   . Deaf   . Depression   . Radiation 07/12/15-07/26/15   left femur 30 Gy  . S/P bilateral oophorectomy     SURGICAL HISTORY: Past Surgical History:  Procedure Laterality Date  . ABDOMINAL HYSTERECTOMY    . INTRAMEDULLARY (IM) NAIL INTERTROCHANTERIC Left 06/26/2015   Procedure: LEFT BIOMET LONG AFFIXS NAIL;  Surgeon: MMarybelle Killings MD;  [place] [facility]  Service: Orthopedics;  Laterality: Left;  . IR GENERIC HISTORICAL  08/06/2015   IR [place] ACC[place] LEFT 08/06/2015 GAletta Edouard MD [facility]  . IR GENERIC HISTORICAL  08/06/2015  IR FLUORO GUIDE CV LINE LEFT 08/06/2015 Aletta Edouard, MD [facility]  . IR GENERIC HISTORICAL  03/05/2016   IR CV LINE INJECTION 03/05/2016 Markus Daft, MD [facility]  . MASTECTOMY     right breast  . MASTECTOMY    . OVARIAN CYST REMOVAL    . RADIOLOGY WITH ANESTHESIA Left 06/25/2015   Procedure: MRI OF LEFT HIP WITH OR WITHOUT CONTRAST;  Surgeon: Medication Radiologist, MD;  Location: [facility];  Service: Radiology;  Laterality: Left;  DR. MCINTYRE/MRI  . TUBAL LIGATION      SOCIAL HISTORY: Social History   Socioeconomic History  . Marital status: Married    Spouse name: Not on file  . Number of children: 2  . Years of education: Not on file  . Highest education level: Not on file  Occupational History  . Not on file  Social Needs  . Financial resource strain: Not on file  . Food insecurity:    Worry: Not on file    Inability: Not on file  . Transportation needs:    Medical: Not on file    Non-medical: Not on file  Tobacco Use  . Smoking  status: Never Smoker  . Smokeless tobacco: Never Used  Substance and Sexual Activity  . Alcohol use: No  . Drug use: No  . Sexual activity: Yes    Birth control/protection: Surgical  Lifestyle  . Physical activity:    Days per week: Not on file    Minutes per session: Not on file  . Stress: Not on file  Relationships  . Social connections:    Talks on phone: Not on file    Gets together: Not on file    Attends religious service: Not on file    Active member of club or organization: Not on file    Attends meetings of clubs or organizations: Not on file    Relationship status: Not on file  . Intimate partner violence:    Fear of current or ex partner: Not on file    Emotionally abused: Not on file    Physically abused: Not on file    Forced sexual activity: Not on file  Other Topics Concern  . Not on file  Social History Narrative  . Not on file    FAMILY HISTORY: Family History  Problem Relation Age of Onset  . Hypertension Mother   . Seizures Mother   . Alzheimer's disease Maternal Uncle   . Pancreatitis Maternal Grandmother   . Heart attack Maternal Grandfather     ALLERGIES:  is allergic to chlorhexidine; promethazine hcl; and diphenhydramine hcl.  MEDICATIONS:  Current Outpatient Medications  Medication Sig Dispense Refill  . Budesonide ER 9 MG TB24 Take 9 mg by mouth daily. Take with food early in the day. 30 tablet 2  . Cholecalciferol 2000 units CAPS Take 1 capsule by mouth daily.    . diphenoxylate-atropine (LOMOTIL) 2.5-0.025 MG tablet Take 1-2 tablets by mouth 4 (four) times daily as needed for diarrhea or loose stools. 30 tablet 0  . divalproex (DEPAKOTE) 250 MG DR tablet Take 1 tablet (250 mg total) by mouth at bedtime. 30 tablet 2  . HYDROcodone-acetaminophen (NORCO) 10-325 MG tablet Take 1 tablet by mouth every 6 (six) hours as needed. 45 tablet 0  . lidocaine-prilocaine (EMLA) cream Apply 1 application topically as needed. Apply to Porta-Cath 1-2 hours  prior to access as directed. 30 g 2  . loperamide (IMODIUM) 2 MG capsule Take 2 capsules (4  mg total) by mouth 3 (three) times daily. May take additional tablets daily to titrate to 1-2 bowel movements each day 200 capsule 2  . ondansetron (ZOFRAN) 8 MG tablet Take 1 tablet (8 mg total) by mouth 2 (two) times daily as needed (Nausea or vomiting). 30 tablet 1  . potassium chloride (K-DUR) 10 MEQ tablet Take 1 tablet (10 mEq total) by mouth 2 (two) times daily. 60 tablet 0  . prochlorperazine (COMPAZINE) 10 MG tablet Take 1 tablet (10 mg total) by mouth every 6 (six) hours as needed for nausea or vomiting. 30 tablet 2  . prochlorperazine (COMPAZINE) 10 MG tablet Take 1 tablet (10 mg total) by mouth every 6 (six) hours as needed (Nausea or vomiting). 30 tablet 1   Current Facility-Administered Medications  Medication Dose Route Frequency Provider Last Rate Last Dose  . 0.9 %  sodium chloride infusion   Intravenous Continuous Truitt Merle, MD   Stopped at 10/27/17 1259   Facility-Administered Medications Ordered in Other Visits  Medication Dose Route Frequency Provider Last Rate Last Dose  . heparin lock flush 100 UNIT/ML injection           . heparin lock flush 100 unit/mL  500 Units Intravenous Once Truitt Merle, MD      . iopamidol (ISOVUE-300) 61 % injection 30 mL  30 mL Oral Once Truitt Merle, MD      . sodium chloride 0.9 % 1,000 mL with potassium chloride 10 mEq infusion   Intravenous Continuous Gordy Levan, MD   Stopped at 09/10/15 1653  . sodium chloride 0.9 % injection 10 mL  10 mL Intravenous PRN Livesay, Lennis P, MD      . sodium chloride 0.9 % injection             REVIEW OF SYSTEMS:  Constitutional: Denies fevers, chills or abnormal night sweats , low appetite from stress, weight stable Eyes: Denies blurriness of vision, double vision or watery eyes Ears, nose, mouth, throat, and face: Denies mucositis or sore throat (+) deaf Respiratory: Denies cough, dyspnea or wheezes    Cardiovascular: Denies palpitation, chest discomfort or lower extremity swelling Skin: No rashes or discolorations  Gastrointestinal:  No new changes in BMs (+) RLQ abdominal pain  Lymphatics: Denies new lymphadenopathy or easy bruising. MSK: No new joint or bone pain Behavioral/Psych: No new changes  All other systems were reviewed with the patient and are negative.  PHYSICAL EXAMINATION:  ECOG PERFORMANCE STATUS: 1 Vitals:   10/27/17 0846  BP: 107/66  Pulse: 70  Resp: 18  Temp: 97.8 F (36.6 C)  TempSrc: Oral  SpO2: 99%  Weight: 127 lb 11.2 oz (57.9 kg)  Height: 5' 7"  (1.702 m)    GENERAL:alert, no distress and comfortable  EYES: normal, conjunctiva are pink and non-injected, sclera clear OROPHARYNX:no exudate, no erythema and lips, buccal mucosa, and tongue normal  NECK: supple, thyroid normal size, non-tender, without nodularity LYMPH:  no palpable lymphadenopathy in the cervical, axillary or inguinal LUNGS: clear to auscultation and percussion with normal breathing effort HEART: regular rate & rhythm and no murmurs and no lower extremity edema ABDOMEN:abdomen soft, No organomegaly.  He has significant tenderness in the right lower quadrant of abdomen, slightly rebound pain, no abdominal rigidity, bowel sounds normal. Musculoskeletal:no cyanosis of digits and no clubbing  PSYCH: alert & oriented x 3 with fluent speech NEURO: no focal motor/sensory deficits   LABORATORY DATA:  I have reviewed the data as listed CBC Latest Ref Rng &  Units 10/27/2017 10/20/2017 10/08/2017  WBC 4.0 - 10.5 K/uL 2.7(L) 3.2(L) 2.8(L)  Hemoglobin 12.0 - 15.0 g/dL 11.2(L) 11.2(L) 11.5(L)  Hematocrit 36.0 - 46.0 % 33.1(L) 32.0(L) 32.7(L)  Platelets 150 - 400 K/uL 158 108(L) 177   CMP Latest Ref Rng & Units 10/27/2017 10/20/2017 10/08/2017  Glucose 70 - 99 mg/dL 87 106(H) 94  BUN 6 - 20 mg/dL 9 9 10   Creatinine 0.44 - 1.00 mg/dL 0.78 0.77 0.78  Sodium 135 - 145 mmol/L 140 143 141  Potassium  3.5 - 5.1 mmol/L 3.6 3.5 3.8  Chloride 98 - 111 mmol/L 106 108 106  CO2 22 - 32 mmol/L 25 24 25   Calcium 8.9 - 10.3 mg/dL 9.0 9.1 9.3  Total Protein 6.5 - 8.1 g/dL 6.5 6.8 6.7  Total Bilirubin 0.3 - 1.2 mg/dL 3.1(H) 2.9(H) 1.8(H)  Alkaline Phos 38 - 126 U/L 65 71 60  AST 15 - 41 U/L 16 19 15   ALT 0 - 44 U/L 25 17 13     TUMOR MARKER  CA 27.29 Results for Debra, [name] (MRN [id number]) as of 10/12/2017 15:49  Ref. Range 07/19/2017 13:36 08/23/2017 08:12 10/08/2017 08:07  CA 27.29 Latest Ref Range: 0.0 - 38.6 U/mL 49.2 (H) 62.0 (H) 56.0 (H)     PROCEDURES: 08/04/2017 ECHO Study Conclusions  - Left ventricle: Inferobasal hypokinesis. The cavity size was   normal. The estimated ejection fraction was 55%. Wall motion was   normal; there were no regional wall motion abnormalities. Left   ventricular diastolic function parameters were normal. - Mitral valve: Calcified annulus. Mildly thickened leaflets . - Atrial septum: No defect or patent foramen ovale was identified. - Impressions: Normal GLS-21.2  RADIOGRAPHIC STUDIES:  08/30/2017 PET Scan  IMPRESSION: 1. Worsening osseous metastatic disease. 2. Focal hypermetabolism in the central spinal canal the L1 level, similar to the prior exam. Metastatic implant cannot be excluded. 3. Slight enlargement of a right thyroid nodule which now appears more cystic in character. Consider further evaluation with thyroid ultrasound. If patient is clinically hyperthyroid, consider nuclear medicine thyroid uptake and scan.  PET Scan 04/30/17 IMPRESSION: 1. Primarily increase in hypermetabolism of osseous metastasis. An isolated right acetabular lesion is decreased in hypermetabolism since the prior exam. 2. No evidence of hypermetabolic soft tissue metastasis. 3. Similar non FDG avid right-sided thyroid nodule, favoring a benign etiology. Recommend attention on follow-up.  PET SCAN: 01/27/17 IMPRESSION: 1. Mixed response to osseous metastasis.  Some hypermetabolic lesions are new and increasingly hypermetabolic, while another index lesion is decreasingly hypermetabolic. 2. No evidence of hypermetabolic soft tissue metastasis.  CT A/P IMPRESSION: 10/28/16 No acute process demonstrated in the abdomen or pelvis. No evidence of bowel obstruction or inflammation.  CT CAP and Bone Scan: 10/05/16  Bones: left intramedullary rod and left femoral head neck screw in place. In the proximal diaphysis of the left femur there is cortical thickening and irregularity. No lytic or blastic bone lesions. No fracture or vertebral endplate destruction.  No definite evidence of recurrent disease in the chest, abdomen, or pelvis.  PET 05/20/2016 IMPRESSION: 1. There multiple metastatic foci in the bony pelvis, including some new abnormal foci compare to the prior PET-CT, compatible with mildly progressive bony metastatic disease. Also, a left T11 vertebral hypermetabolic metastatic lesion is observed, new compared to the prior exam. 2. No active extraosseous malignancy is currently identified. 3. Right anterior fourth rib healing fracture, appears benign.  NM Bone Scan 05/01/2016 IMPRESSION: 1. Small focal uptake involving the anterior rib  ends of the left second and right fourth ribs, new since the prior bone scan. The location of this uptake is more suggestive of a traumatic or inflammatory etiology as opposed to metastatic disease. No other evidence of metastatic disease. 2. There are other areas of stable uptake which are most likely degenerative/reactive, including right shoulder and cervical spine uptake and uptake along the right femur adjacent to the ORIF Hardware.  CT CAP 05/01/2016 IMPRESSION: 1. Stable exam.  No new or progressive disease identified. 2. No evidence for residual or recurrent adenopathy within the chest. 3. Stable bone metastasis.  I have personally reviewed the radiological images as listed and agreed with the findings in the  report.  ASSESSMENT & PLAN:  Mrs. Jakes is a 52 y.o. postmenopause female with:  1. RLQ abdominal pain  -It started about 2 days ago, she is quite tender in the right lower quadrant of abdomen, and have some rebound pain.  She states the pain is different from previous right hip pain. -I will obtain a stat CT abdomen pelvis with contrast to rule out acute appendicitis, or other bowel issue.   2. 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 -Restaging PET 12-25-15 markedly improved of bone metastasis after initial systemic chemo  -she was switched to letrozole and maintenance Herceptin and the perjeta on 01/16/16 which stopped on 05/14/16.  -Did not tolerate SQ xgeva and it was changed to zometa in 06/2015 -Continue monitoring ECHO every 3 months when she is on herceptin -I previously reviewed her CT scan and bone scan from 05/01/2016, which showed stable disease overall, a few new uptake of anterior ribs on bone scan are indeterminate. -I previously discussed her restaging PET scan from 05/20/2016. Unfortunately it showed mild disease progression of her bone metastasis  -I have changed her treatment to second line Kadcyla and letrozole on 06/04/16, she is tolerating very well. She declined other chemo.  -We previously received records from [facility] in [place], [place]. She underwent CT CAP w contrast on 10/02/16 and bone scan on 09/21/16. CT shows no evidence of recurrent disease in the chest, abdomen, or pelvis; Bone scan indicates no lytic or blastic bone lesions.  -She has been on Zometa every 4-6 weeks since June 2017. We previously changed to every 3 months in 12/2016. -PET Scan from 01/27/17 images were reviewed in person, it shows a mixed response to osseous metastasis, most bone lesions have improved comparing to 05/2016, but she has developed  one new spine metastasis, and involved her pelvic bone lesion slightly increased in metabolism.  No other visceral organ metastasis. She is clinically doing well.  -Kadcyla was switched to Exemestane, oral HER2 antibody treatment lapatinib and herceptin starting 02/08/17. -She tried lapatinib, could not tolerate and stopped. -Her PET Scan from 04/30/17 revealed a primarily increase in hypermetabolism of osseous metastasis. There are no new lesions.  I reviewed the images and compared to the previous images in person with patient and her husband. I suspect her progression is due to her not being able to fully tolerate lapatinib -I previously discussed options with clinical trial for the new Her2 antibody conjugate Trastuzumab deruxtecan, the phase 3 clinical trial has been open, and the closest site is in [place] and [employer]. She is not interested in going to [place] at this time. -She did not respond to CDK4/6 inhibitor -Her 08/30/2017  PET Scan showed disease progression in her bones, no other new visceral metastasis. I reviewed her PET images with her in person. She denies hip or pelvic pain and has stable right shoulder and left hip pain from before.  -Will stop Ibrance due to disease progression, and started her on Neratinib -She tolerated neratinib very poorly, with diarrhea, nausea, vomiting, taste change and poor appetite. I stopped it previously. --Due to her disease progression, I recommend her to start chemotherapy again with weekly Taxol, along with Herceptin and pejeta every 3 weeks on 10/20/2017 -She tolerated the first cycle chemotherapy well last week, with mild fatigue, and mild diarrhea.  Pejeta dose was reduced due to her previous diarrhea from pejeta.  -Due to her new onset right lower quadrant abdominal pain, I will hold on chemotherapy today.  If CT negative, we will schedule her chemo to later this week.  3. History of MRSA skin infection scalp, boil at low back scalp and upper  lip -I prescribed doxycycline on 04/24/16 for 7 days  -Resolved now.  4. Congenital deafness -needs sign language interpreter for all visits.  -Husband also deaf.  5. premature surgical menopause -post BSO 2028fr pelvic pain and ovarian cysts, likely was also beneficial from standpoint of the breast cancer. -hysterectomy   6. Intermittent anemia  -mild, possible related to prior chemo -She is asymptomatic, we'll continue close monitoring  7. Hypokalemia  -She has had intermittent hypokalemia --She is only taking oral potassium 10 mEq twice daily daily, will continue   8. Goal of care discussion  -We previously discussed the incurable nature of her cancer, and the overall poor prognosis, especially if she does not have good response to chemotherapy or progress on chemo.  -The patient understands the goal of care is palliative. -she is full code for now   9. Bilateral Leg Pain -She initially presented with worsening left leg and hip pain secondary to bone mets, she had surgery before, this was much improved after her prior chemo -The patient previously took NSacfor this pain, 1 tablet every 6 hours as needed for pain. Which helped. -I previously encouraged her to follow up with Dr. YLorin Mercyabout the pain in her left leg. 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  10. Insomnia, Depression, Stress  -I previously gave her Ambien 545m but it did not work well.  -continue ativan 1 mg to take at night as needed  -mirtazapine did not work, she also tried trazodone from PCP which gave her a rash. She stopped taking it.  -After the death of her dog she has been stressed and sad from how it happened.  -I previously offered her the chance to speak with our counselor or a therapist, she declined at this time. She denied inclinations of self  harm.  -I also previously offered her the choice of antidepressants. She will try coping without medication for now. I previously encouraged her to speak with family or friends about her feelings.   11.  Hyperbilirubinemia  -She has developed mild hyperbilirubinemia recently, TBIL 1.2-2.7, okay to continue treatment  -increased to 2.8 on 08/31/2017    Plan:  -Due to new RLQ abdominal pain, I will order a CT scan to r/u appendicitis and will hold chemo until results come out.  -He will receive IV fluids and IV morphine 2 mg in the  infusion room was CAT scan  Pt had many questions. All questions were answered. The patient knows to call the clinic with any problems, questions or concerns.  Dierdre Searles Dweik am acting as scribe for Dr. Truitt Merle.  I have reviewed the above documentation for accuracy and completeness, and I agree with the above.    Truitt Merle, MD 10/27/2017   Addendum  Abigail Butts had CT scan done in the late afternoon, and resolved was reported from radiology to me.  No CT evidence of acute appendicitis or other new pathology in the abdominal pelvis, stable bone metastasis.  I explained the above to patient in the radiology waiting room, she states her pain is still there.  I explained to her that her pain is likely related to her bone metastasis, and she can increase her hydrocodone to 2 tablets as needed.  I will reschedule her chemotherapy to 2 days later this week, she voiced good understanding and agrees with the plan.  I spent 30 minutes counseling the patient face to face. The total time spent in the appointment was 40 minutes and more than 50% was on counseling.   Truitt Merle  10/27/2017 6:00 PM

## 2017-10-24 NOTE — Addendum Note (Signed)
Addended by: Truitt Merle on: 10/24/2017 12:12 PM   Modules accepted: Orders

## 2017-10-27 ENCOUNTER — Inpatient Hospital Stay: Payer: Medicaid Other

## 2017-10-27 ENCOUNTER — Telehealth: Payer: Self-pay | Admitting: Hematology

## 2017-10-27 ENCOUNTER — Ambulatory Visit (HOSPITAL_COMMUNITY)
Admission: RE | Admit: 2017-10-27 | Discharge: 2017-10-27 | Disposition: A | Discharge status: Home / Self Care | Payer: Medicaid Other | Source: Ambulatory Visit | Attending: Hematology | Admitting: Hematology

## 2017-10-27 ENCOUNTER — Encounter (HOSPITAL_COMMUNITY): Payer: Self-pay | Admitting: Radiology

## 2017-10-27 ENCOUNTER — Inpatient Hospital Stay (HOSPITAL_BASED_OUTPATIENT_CLINIC_OR_DEPARTMENT_OTHER): Payer: Medicaid Other | Admitting: Hematology

## 2017-10-27 VITALS — BP 107/66 | HR 70 | Temp 97.8°F | Resp 18 | Ht 67.0 in | Wt 127.7 lb

## 2017-10-27 DIAGNOSIS — Z9221 Personal history of antineoplastic chemotherapy: Secondary | ICD-10-CM | POA: Diagnosis not present

## 2017-10-27 DIAGNOSIS — H905 Unspecified sensorineural hearing loss: Secondary | ICD-10-CM | POA: Diagnosis not present

## 2017-10-27 DIAGNOSIS — F419 Anxiety disorder, unspecified: Secondary | ICD-10-CM | POA: Diagnosis not present

## 2017-10-27 DIAGNOSIS — G893 Neoplasm related pain (acute) (chronic): Secondary | ICD-10-CM | POA: Diagnosis not present

## 2017-10-27 DIAGNOSIS — C7951 Secondary malignant neoplasm of bone: Secondary | ICD-10-CM | POA: Insufficient documentation

## 2017-10-27 DIAGNOSIS — Z23 Encounter for immunization: Secondary | ICD-10-CM | POA: Diagnosis not present

## 2017-10-27 DIAGNOSIS — C50911 Malignant neoplasm of unspecified site of right female breast: Secondary | ICD-10-CM

## 2017-10-27 DIAGNOSIS — E876 Hypokalemia: Secondary | ICD-10-CM | POA: Diagnosis not present

## 2017-10-27 DIAGNOSIS — D649 Anemia, unspecified: Secondary | ICD-10-CM | POA: Diagnosis not present

## 2017-10-27 DIAGNOSIS — Z8614 Personal history of Methicillin resistant Staphylococcus aureus infection: Secondary | ICD-10-CM | POA: Diagnosis not present

## 2017-10-27 DIAGNOSIS — Z79811 Long term (current) use of aromatase inhibitors: Secondary | ICD-10-CM

## 2017-10-27 DIAGNOSIS — M545 Low back pain: Secondary | ICD-10-CM | POA: Diagnosis not present

## 2017-10-27 DIAGNOSIS — R1084 Generalized abdominal pain: Secondary | ICD-10-CM | POA: Insufficient documentation

## 2017-10-27 DIAGNOSIS — Z923 Personal history of irradiation: Secondary | ICD-10-CM | POA: Diagnosis not present

## 2017-10-27 DIAGNOSIS — Z170 Estrogen receptor positive status [ER+]: Secondary | ICD-10-CM

## 2017-10-27 DIAGNOSIS — Z79899 Other long term (current) drug therapy: Secondary | ICD-10-CM | POA: Diagnosis not present

## 2017-10-27 DIAGNOSIS — G4700 Insomnia, unspecified: Secondary | ICD-10-CM

## 2017-10-27 DIAGNOSIS — Z5112 Encounter for antineoplastic immunotherapy: Secondary | ICD-10-CM | POA: Diagnosis not present

## 2017-10-27 DIAGNOSIS — C50919 Malignant neoplasm of unspecified site of unspecified female breast: Secondary | ICD-10-CM

## 2017-10-27 DIAGNOSIS — C799 Secondary malignant neoplasm of unspecified site: Principal | ICD-10-CM

## 2017-10-27 DIAGNOSIS — Z853 Personal history of malignant neoplasm of breast: Secondary | ICD-10-CM | POA: Diagnosis not present

## 2017-10-27 LAB — CBC WITH DIFFERENTIAL/PLATELET
ABS IMMATURE GRANULOCYTES: 0.04 10*3/uL (ref 0.00–0.07)
BASOS PCT: 0 %
Basophils Absolute: 0 10*3/uL (ref 0.0–0.1)
EOS PCT: 5 %
Eosinophils Absolute: 0.1 10*3/uL (ref 0.0–0.5)
HCT: 33.1 % — ABNORMAL LOW (ref 36.0–46.0)
HEMOGLOBIN: 11.2 g/dL — AB (ref 12.0–15.0)
Immature Granulocytes: 2 %
Lymphocytes Relative: 34 %
Lymphs Abs: 0.9 10*3/uL (ref 0.7–4.0)
MCH: 32.2 pg (ref 26.0–34.0)
MCHC: 33.8 g/dL (ref 30.0–36.0)
MCV: 95.1 fL (ref 80.0–100.0)
MONO ABS: 0.1 10*3/uL (ref 0.1–1.0)
MONOS PCT: 4 %
Neutro Abs: 1.5 10*3/uL — ABNORMAL LOW (ref 1.7–7.7)
Neutrophils Relative %: 55 %
PLATELETS: 158 10*3/uL (ref 150–400)
RBC: 3.48 MIL/uL — AB (ref 3.87–5.11)
RDW: 11.9 % (ref 11.5–15.5)
WBC: 2.7 10*3/uL — AB (ref 4.0–10.5)
nRBC: 0 % (ref 0.0–0.2)

## 2017-10-27 LAB — COMPREHENSIVE METABOLIC PANEL
ALBUMIN: 3.4 g/dL — AB (ref 3.5–5.0)
ALK PHOS: 65 U/L (ref 38–126)
ALT: 25 U/L (ref 0–44)
AST: 16 U/L (ref 15–41)
Anion gap: 9 (ref 5–15)
BUN: 9 mg/dL (ref 6–20)
CO2: 25 mmol/L (ref 22–32)
CREATININE: 0.78 mg/dL (ref 0.44–1.00)
Calcium: 9 mg/dL (ref 8.9–10.3)
Chloride: 106 mmol/L (ref 98–111)
GFR calc Af Amer: >60 mL/min (ref >60)
GLUCOSE: 87 mg/dL (ref 70–99)
Potassium: 3.6 mmol/L (ref 3.5–5.1)
Sodium: 140 mmol/L (ref 135–145)
TOTAL PROTEIN: 6.5 g/dL (ref 6.5–8.1)
Total Bilirubin: 3.1 mg/dL — ABNORMAL HIGH (ref 0.3–1.2)

## 2017-10-27 MED ORDER — HEPARIN SOD (PORK) LOCK FLUSH 100 UNIT/ML IV SOLN: 500.0000 [IU] | Freq: Once | INTRAVENOUS | Status: DC

## 2017-10-27 MED ORDER — MORPHINE SULFATE 4 MG/ML IJ SOLN
2.0000 mg | Freq: Once | INTRAMUSCULAR | Status: AC
  Administered 2017-10-27: 2 mg via INTRAVENOUS
  Filled 2017-10-27: qty 1

## 2017-10-27 MED ORDER — MORPHINE SULFATE (PF) 4 MG/ML IV SOLN
INTRAVENOUS | Status: AC
  Filled 2017-10-27: qty 1

## 2017-10-27 MED ORDER — SODIUM CHLORIDE 0.9 % IV SOLN
INTRAVENOUS | Status: DC
  Administered 2017-10-27: 11:00:00 via INTRAVENOUS
  Filled 2017-10-27 (×2): qty 250

## 2017-10-27 MED ORDER — IOHEXOL 300 MG/ML  SOLN
100.0000 mL | Freq: Once | INTRAMUSCULAR | Status: AC | PRN
  Administered 2017-10-27: 100 mL via INTRAVENOUS

## 2017-10-27 MED ORDER — HEPARIN SOD (PORK) LOCK FLUSH 100 UNIT/ML IV SOLN
INTRAVENOUS | Status: AC
  Filled 2017-10-27: qty 5

## 2017-10-27 MED ORDER — SODIUM CHLORIDE 0.9 % IJ SOLN
INTRAMUSCULAR | Status: AC
  Filled 2017-10-27: qty 50

## 2017-10-27 MED ORDER — IOPAMIDOL (ISOVUE-300) INJECTION 61%
INTRAVENOUS | Status: AC
  Administered 2017-10-27: 30 mL via ORAL
  Filled 2017-10-27: qty 30

## 2017-10-27 MED ORDER — IOPAMIDOL (ISOVUE-300) INJECTION 61%: 30.0000 mL | Freq: Once | INTRAVENOUS | Status: DC

## 2017-10-27 NOTE — Telephone Encounter (Signed)
No los 10/16 °

## 2017-10-27 NOTE — Patient Instructions (Signed)
Dehydration, Adult Dehydration is when there is not enough fluid or water in your body. This happens when you lose more fluids than you take in. Dehydration can range from mild to very bad. It should be treated right away to keep it from getting very bad. Symptoms of mild dehydration may include:  Thirst.  Dry lips.  Slightly dry mouth.  Dry, warm skin.  Dizziness. Symptoms of moderate dehydration may include:  Very dry mouth.  Muscle cramps.  Dark pee (urine). Pee may be the color of tea.  Your body making less pee.  Your eyes making fewer tears.  Heartbeat that is uneven or faster than normal (palpitations).  Headache.  Light-headedness, especially when you stand up from sitting.  Fainting (syncope). Symptoms of very bad dehydration may include:  Changes in skin, such as: ? Cold and clammy skin. ? Blotchy (mottled) or pale skin. ? Skin that does not quickly return to normal after being lightly pinched and let go (poor skin turgor).  Changes in body fluids, such as: ? Feeling very thirsty. ? Your eyes making fewer tears. ? Not sweating when body temperature is high, such as in hot weather. ? Your body making very little pee.  Changes in vital signs, such as: ? Weak pulse. ? Pulse that is more than 100 beats a minute when you are sitting still. ? Fast breathing. ? Low blood pressure.  Other changes, such as: ? Sunken eyes. ? Cold hands and feet. ? Confusion. ? Lack of energy (lethargy). ? Trouble waking up from sleep. ? Short-term weight loss. ? Unconsciousness. Follow these instructions at home:  If told by your doctor, drink an ORS: ? Make an ORS by using instructions on the package. ? Start by drinking small amounts, about  cup (120 mL) every 5-10 minutes. ? Slowly drink more until you have had the amount that your doctor said to have.  Drink enough clear fluid to keep your pee clear or pale yellow. If you were told to drink an ORS, finish the ORS  first, then start slowly drinking clear fluids. Drink fluids such as: ? Water. Do not drink only water by itself. Doing that can make the salt (sodium) level in your body get too low (hyponatremia). ? Ice chips. ? Fruit juice that you have added water to (diluted). ? Low-calorie sports drinks.  Avoid: ? Alcohol. ? Drinks that have a lot of sugar. These include high-calorie sports drinks, fruit juice that does not have water added, and soda. ? Caffeine. ? Foods that are greasy or have a lot of fat or sugar.  Take over-the-counter and prescription medicines only as told by your doctor.  Do not take salt tablets. Doing that can make the salt level in your body get too high (hypernatremia).  Eat foods that have minerals (electrolytes). Examples include bananas, oranges, potatoes, tomatoes, and spinach.  Keep all follow-up visits as told by your doctor. This is important. Contact a doctor if:  You have belly (abdominal) pain that: ? Gets worse. ? Stays in one area (localizes).  You have a rash.  You have a stiff neck.  You get angry or annoyed more easily than normal (irritability).  You are more sleepy than normal.  You have a harder time waking up than normal.  You feel: ? Weak. ? Dizzy. ? Very thirsty.  You have peed (urinated) only a small amount of very dark pee during 6-8 hours. Get help right away if:  You have symptoms of   very bad dehydration.  You cannot drink fluids without throwing up (vomiting).  Your symptoms get worse with treatment.  You have a fever.  You have a very bad headache.  You are throwing up or having watery poop (diarrhea) and it: ? Gets worse. ? Does not go away.  You have blood or something green (bile) in your throw-up.  You have blood in your poop (stool). This may cause poop to look black and tarry.  You have not peed in 6-8 hours.  You pass out (faint).  Your heart rate when you are sitting still is more than 100 beats a  minute.  You have trouble breathing. This information is not intended to replace advice given to you by your health care provider. Make sure you discuss any questions you have with your health care provider. Document Released: 10/25/2008 Document Revised: 07/19/2015 Document Reviewed: 02/22/2015 Elsevier Interactive Patient Education  2018 [employer]  

## 2017-10-27 NOTE — Patient Instructions (Signed)

## 2017-10-29 ENCOUNTER — Ambulatory Visit: Payer: Medicaid Other

## 2017-11-03 ENCOUNTER — Inpatient Hospital Stay: Payer: Medicaid Other

## 2017-11-03 ENCOUNTER — Other Ambulatory Visit: Payer: Self-pay | Admitting: Hematology

## 2017-11-03 ENCOUNTER — Telehealth: Payer: Self-pay

## 2017-11-03 VITALS — BP 96/60 | HR 62 | Temp 98.0°F | Resp 16

## 2017-11-03 DIAGNOSIS — Z923 Personal history of irradiation: Secondary | ICD-10-CM | POA: Diagnosis not present

## 2017-11-03 DIAGNOSIS — C50911 Malignant neoplasm of unspecified site of right female breast: Secondary | ICD-10-CM

## 2017-11-03 DIAGNOSIS — Z5112 Encounter for antineoplastic immunotherapy: Secondary | ICD-10-CM | POA: Diagnosis not present

## 2017-11-03 DIAGNOSIS — C7951 Secondary malignant neoplasm of bone: Secondary | ICD-10-CM | POA: Diagnosis not present

## 2017-11-03 DIAGNOSIS — Z95828 Presence of other vascular implants and grafts: Secondary | ICD-10-CM

## 2017-11-03 DIAGNOSIS — G4700 Insomnia, unspecified: Secondary | ICD-10-CM | POA: Diagnosis not present

## 2017-11-03 DIAGNOSIS — Z79811 Long term (current) use of aromatase inhibitors: Secondary | ICD-10-CM | POA: Diagnosis not present

## 2017-11-03 DIAGNOSIS — C50919 Malignant neoplasm of unspecified site of unspecified female breast: Secondary | ICD-10-CM

## 2017-11-03 DIAGNOSIS — M545 Low back pain: Secondary | ICD-10-CM | POA: Diagnosis not present

## 2017-11-03 DIAGNOSIS — G893 Neoplasm related pain (acute) (chronic): Secondary | ICD-10-CM | POA: Diagnosis not present

## 2017-11-03 DIAGNOSIS — Z170 Estrogen receptor positive status [ER+]: Secondary | ICD-10-CM | POA: Diagnosis not present

## 2017-11-03 DIAGNOSIS — C799 Secondary malignant neoplasm of unspecified site: Principal | ICD-10-CM

## 2017-11-03 DIAGNOSIS — D649 Anemia, unspecified: Secondary | ICD-10-CM | POA: Diagnosis not present

## 2017-11-03 DIAGNOSIS — Z79899 Other long term (current) drug therapy: Secondary | ICD-10-CM | POA: Diagnosis not present

## 2017-11-03 DIAGNOSIS — Z23 Encounter for immunization: Secondary | ICD-10-CM | POA: Diagnosis not present

## 2017-11-03 DIAGNOSIS — F419 Anxiety disorder, unspecified: Secondary | ICD-10-CM | POA: Diagnosis not present

## 2017-11-03 DIAGNOSIS — Z9221 Personal history of antineoplastic chemotherapy: Secondary | ICD-10-CM | POA: Diagnosis not present

## 2017-11-03 DIAGNOSIS — E876 Hypokalemia: Secondary | ICD-10-CM | POA: Diagnosis not present

## 2017-11-03 DIAGNOSIS — Z8614 Personal history of Methicillin resistant Staphylococcus aureus infection: Secondary | ICD-10-CM | POA: Diagnosis not present

## 2017-11-03 DIAGNOSIS — H905 Unspecified sensorineural hearing loss: Secondary | ICD-10-CM | POA: Diagnosis not present

## 2017-11-03 LAB — CBC WITH DIFFERENTIAL/PLATELET
ABS IMMATURE GRANULOCYTES: 0 10*3/uL (ref 0.00–0.07)
BASOS PCT: 1 %
Basophils Absolute: 0 10*3/uL (ref 0.0–0.1)
EOS ABS: 0.1 10*3/uL (ref 0.0–0.5)
Eosinophils Relative: 4 %
HCT: 31.4 % — ABNORMAL LOW (ref 36.0–46.0)
Hemoglobin: 10.8 g/dL — ABNORMAL LOW (ref 12.0–15.0)
Immature Granulocytes: 0 %
Lymphocytes Relative: 39 %
Lymphs Abs: 0.8 10*3/uL (ref 0.7–4.0)
MCH: 32.3 pg (ref 26.0–34.0)
MCHC: 34.4 g/dL (ref 30.0–36.0)
MCV: 94 fL (ref 80.0–100.0)
MONO ABS: 0.3 10*3/uL (ref 0.1–1.0)
MONOS PCT: 17 %
NEUTROS ABS: 0.8 10*3/uL — AB (ref 1.7–7.7)
Neutrophils Relative %: 39 %
PLATELETS: 170 10*3/uL (ref 150–400)
RBC: 3.34 MIL/uL — ABNORMAL LOW (ref 3.87–5.11)
RDW: 12 % (ref 11.5–15.5)
WBC: 1.9 10*3/uL — ABNORMAL LOW (ref 4.0–10.5)
nRBC: 0 % (ref 0.0–0.2)

## 2017-11-03 LAB — COMPREHENSIVE METABOLIC PANEL
ALT: 17 U/L (ref 0–44)
ANION GAP: 11 (ref 5–15)
AST: 16 U/L (ref 15–41)
Albumin: 3.4 g/dL — ABNORMAL LOW (ref 3.5–5.0)
Alkaline Phosphatase: 67 U/L (ref 38–126)
BUN: 12 mg/dL (ref 6–20)
CHLORIDE: 108 mmol/L (ref 98–111)
CO2: 23 mmol/L (ref 22–32)
Calcium: 8.9 mg/dL (ref 8.9–10.3)
Creatinine, Ser: 0.82 mg/dL (ref 0.44–1.00)
GFR calc Af Amer: >60 mL/min (ref >60)
Glucose, Bld: 89 mg/dL (ref 70–99)
Potassium: 3.4 mmol/L — ABNORMAL LOW (ref 3.5–5.1)
Sodium: 142 mmol/L (ref 135–145)
TOTAL PROTEIN: 6.4 g/dL — AB (ref 6.5–8.1)
Total Bilirubin: 2.1 mg/dL — ABNORMAL HIGH (ref 0.3–1.2)

## 2017-11-03 MED ORDER — ALPRAZOLAM 0.25 MG PO TABS: 0.2500 mg | ORAL_TABLET | Freq: Two times a day (BID) | ORAL | 0 refills | Status: DC | PRN

## 2017-11-03 MED ORDER — DEXAMETHASONE SODIUM PHOSPHATE 10 MG/ML IJ SOLN
10.0000 mg | Freq: Once | INTRAMUSCULAR | Status: AC
  Administered 2017-11-03: 10 mg via INTRAVENOUS

## 2017-11-03 MED ORDER — FAMOTIDINE IN NACL 20-0.9 MG/50ML-% IV SOLN
20.0000 mg | Freq: Once | INTRAVENOUS | Status: AC
  Administered 2017-11-03: 20 mg via INTRAVENOUS

## 2017-11-03 MED ORDER — SODIUM CHLORIDE 0.9 % IV SOLN
Freq: Once | INTRAVENOUS | Status: DC
  Filled 2017-11-03: qty 250

## 2017-11-03 MED ORDER — FAMOTIDINE IN NACL 20-0.9 MG/50ML-% IV SOLN
INTRAVENOUS | Status: AC
  Filled 2017-11-03: qty 50

## 2017-11-03 MED ORDER — SODIUM CHLORIDE 0.9% FLUSH
10.0000 mL | INTRAVENOUS | Status: DC | PRN
  Administered 2017-11-03: 10 mL
  Filled 2017-11-03: qty 10

## 2017-11-03 MED ORDER — SODIUM CHLORIDE 0.9 % IJ SOLN
10.0000 mL | INTRAMUSCULAR | Status: DC | PRN
  Administered 2017-11-03: 10 mL via INTRAVENOUS
  Filled 2017-11-03: qty 10

## 2017-11-03 MED ORDER — LORAZEPAM 2 MG/ML IJ SOLN
0.5000 mg | Freq: Once | INTRAMUSCULAR | Status: AC
  Administered 2017-11-03: 0.5 mg via INTRAVENOUS

## 2017-11-03 MED ORDER — SODIUM CHLORIDE 0.9 % IV SOLN
80.0000 mg/m2 | Freq: Once | INTRAVENOUS | Status: AC
  Administered 2017-11-03: 132 mg via INTRAVENOUS
  Filled 2017-11-03: qty 22

## 2017-11-03 MED ORDER — LORAZEPAM 2 MG/ML IJ SOLN: 0.5000 mg | Freq: Once | INTRAMUSCULAR | Status: DC

## 2017-11-03 MED ORDER — HEPARIN SOD (PORK) LOCK FLUSH 100 UNIT/ML IV SOLN
500.0000 [IU] | Freq: Once | INTRAVENOUS | Status: AC | PRN
  Administered 2017-11-03: 500 [IU]
  Filled 2017-11-03: qty 5

## 2017-11-03 MED ORDER — LORAZEPAM 2 MG/ML IJ SOLN
INTRAMUSCULAR | Status: AC
  Filled 2017-11-03: qty 1

## 2017-11-03 MED ORDER — DEXAMETHASONE SODIUM PHOSPHATE 10 MG/ML IJ SOLN
INTRAMUSCULAR | Status: AC
  Filled 2017-11-03: qty 1

## 2017-11-03 MED FILL — ALPRAZolam 0.25 MG TABS: 0.25 | 15 days supply | Qty: 30 | Fill #0

## 2017-11-03 NOTE — Patient Instructions (Signed)
BE SURE YOU ARE TAKING TWO OF YOUR 10MEQ POTASSIUM PILLS DAILY.   [facility] Discharge Instructions for Patients Receiving Chemotherapy  Today you received the following chemotherapy agents Herceptin, Perjeta, Taxol.  To help prevent nausea and vomiting after your treatment, we encourage you to take your nausea medication as prescribed.   If you develop nausea and vomiting that is not controlled by your nausea medication, call the clinic.   BELOW ARE SYMPTOMS THAT SHOULD BE REPORTED IMMEDIATELY:  *FEVER GREATER THAN 100.5 F  *CHILLS WITH OR WITHOUT FEVER  NAUSEA AND VOMITING THAT IS NOT CONTROLLED WITH YOUR NAUSEA MEDICATION  *UNUSUAL SHORTNESS OF BREATH  *UNUSUAL BRUISING OR BLEEDING  TENDERNESS IN MOUTH AND THROAT WITH OR WITHOUT PRESENCE OF ULCERS  *URINARY PROBLEMS  *BOWEL PROBLEMS  UNUSUAL RASH Items with * indicate a potential emergency and should be followed up as soon as possible.  Feel free to call the clinic should you have any questions or concerns. The clinic phone number is [phone].  Please show the [facility] at check-in to the Emergency Department and triage nurse.    Trastuzumab injection for infusion What is this medicine? TRASTUZUMAB (tras TOO zoo mab) is a monoclonal antibody. It is used to treat breast cancer and stomach cancer. This medicine may be used for other purposes; ask your health care provider or pharmacist if you have questions. COMMON BRAND NAME(S): Herceptin What should I tell my health care provider before I take this medicine? They need to know if you have any of these conditions: -heart disease -heart failure -lung or breathing disease, like asthma -an unusual or allergic reaction to trastuzumab, benzyl alcohol, or other medications, foods, dyes, or preservatives -pregnant or trying to get pregnant -breast-feeding How should I use this medicine? This drug is given as an infusion into a vein. It is  administered in a hospital or clinic by a specially trained health care professional. Talk to your pediatrician regarding the use of this medicine in children. This medicine is not approved for use in children. Overdosage: If you think you have taken too much of this medicine contact a poison control center or emergency room at once. NOTE: This medicine is only for you. Do not share this medicine with others. What if I miss a dose? It is important not to miss a dose. Call your doctor or health care professional if you are unable to keep an appointment. What may interact with this medicine? This medicine may interact with the following medications: -certain types of chemotherapy, such as daunorubicin, doxorubicin, epirubicin, and idarubicin This list may not describe all possible interactions. Give your health care provider a list of all the medicines, herbs, non-prescription drugs, or dietary supplements you use. Also tell them if you smoke, drink alcohol, or use illegal drugs. Some items may interact with your medicine. What should I watch for while using this medicine? Visit your doctor for checks on your progress. Report any side effects. Continue your course of treatment even though you feel ill unless your doctor tells you to stop. Call your doctor or health care professional for advice if you get a fever, chills or sore throat, or other symptoms of a cold or flu. Do not treat yourself. Try to avoid being around people who are sick. You may experience fever, chills and shaking during your first infusion. These effects are usually mild and can be treated with other medicines. Report any side effects during the infusion to your  health care professional. Fever and chills usually do not happen with later infusions. Do not become pregnant while taking this medicine or for 7 months after stopping it. Women should inform their doctor if they wish to become pregnant or think they might be pregnant. Women  of child-bearing potential will need to have a negative pregnancy test before starting this medicine. There is a potential for serious side effects to an unborn child. Talk to your health care professional or pharmacist for more information. Do not breast-feed an infant while taking this medicine or for 7 months after stopping it. Women must use effective birth control with this medicine. What side effects may I notice from receiving this medicine? Side effects that you should report to your doctor or health care professional as soon as possible: -allergic reactions like skin rash, itching or hives, swelling of the face, lips, or tongue -chest pain or palpitations -cough -dizziness -feeling faint or lightheaded, falls -fever -general ill feeling or flu-like symptoms -signs of worsening heart failure like breathing problems; swelling in your legs and feet -unusually weak or tired Side effects that usually do not require medical attention (report to your doctor or health care professional if they continue or are bothersome): -bone pain -changes in taste -diarrhea -joint pain -nausea/vomiting -weight loss This list may not describe all possible side effects. Call your doctor for medical advice about side effects. You may report side effects to FDA at 1-800-FDA-1088. Where should I keep my medicine? This drug is given in a hospital or clinic and will not be stored at home. NOTE: This sheet is a summary. It may not cover all possible information. If you have questions about this medicine, talk to your doctor, pharmacist, or health care provider.  2018 Elsevier/Gold Standard (2015-12-24 14:37:52)  Pertuzumab injection What is this medicine? PERTUZUMAB (per TOOZ ue mab) is a monoclonal antibody. It is used to treat breast cancer. This medicine may be used for other purposes; ask your health care provider or pharmacist if you have questions. COMMON BRAND NAME(S): PERJETA What should I tell my  health care provider before I take this medicine? They need to know if you have any of these conditions: -heart disease -heart failure -high blood pressure -history of irregular heart beat -recent or ongoing radiation therapy -an unusual or allergic reaction to pertuzumab, other medicines, foods, dyes, or preservatives -pregnant or trying to get pregnant -breast-feeding How should I use this medicine? This medicine is for infusion into a vein. It is given by a health care professional in a hospital or clinic setting. Talk to your pediatrician regarding the use of this medicine in children. Special care may be needed. Overdosage: If you think you have taken too much of this medicine contact a poison control center or emergency room at once. NOTE: This medicine is only for you. Do not share this medicine with others. What if I miss a dose? It is important not to miss your dose. Call your doctor or health care professional if you are unable to keep an appointment. What may interact with this medicine? Interactions are not expected. Give your health care provider a list of all the medicines, herbs, non-prescription drugs, or dietary supplements you use. Also tell them if you smoke, drink alcohol, or use illegal drugs. Some items may interact with your medicine. This list may not describe all possible interactions. Give your health care provider a list of all the medicines, herbs, non-prescription drugs, or dietary supplements you use. Also  tell them if you smoke, drink alcohol, or use illegal drugs. Some items may interact with your medicine. What should I watch for while using this medicine? Your condition will be monitored carefully while you are receiving this medicine. Report any side effects. Continue your course of treatment even though you feel ill unless your doctor tells you to stop. Do not become pregnant while taking this medicine or for 7 months after stopping it. Women should inform  their doctor if they wish to become pregnant or think they might be pregnant. Women of child-bearing potential will need to have a negative pregnancy test before starting this medicine. There is a potential for serious side effects to an unborn child. Talk to your health care professional or pharmacist for more information. Do not breast-feed an infant while taking this medicine or for 7 months after stopping it. Women must use effective birth control with this medicine. Call your doctor or health care professional for advice if you get a fever, chills or sore throat, or other symptoms of a cold or flu. Do not treat yourself. Try to avoid being around people who are sick. You may experience fever, chills, and headache during the infusion. Report any side effects during the infusion to your health care professional. What side effects may I notice from receiving this medicine? Side effects that you should report to your doctor or health care professional as soon as possible: -breathing problems -chest pain or palpitations -dizziness -feeling faint or lightheaded -fever or chills -skin rash, itching or hives -sore throat -swelling of the face, lips, or tongue -swelling of the legs or ankles -unusually weak or tired Side effects that usually do not require medical attention (report to your doctor or health care professional if they continue or are bothersome): -diarrhea -hair loss -nausea, vomiting -tiredness This list may not describe all possible side effects. Call your doctor for medical advice about side effects. You may report side effects to FDA at 1-800-FDA-1088. Where should I keep my medicine? This drug is given in a hospital or clinic and will not be stored at home. NOTE: This sheet is a summary. It may not cover all possible information. If you have questions about this medicine, talk to your doctor, pharmacist, or health care provider.  2018 Elsevier/Gold Standard (2015-01-31  12:08:50)  Paclitaxel injection What is this medicine? PACLITAXEL (PAK li TAX el) is a chemotherapy drug. It targets fast dividing cells, like cancer cells, and causes these cells to die. This medicine is used to treat ovarian cancer, breast cancer, and other cancers. This medicine may be used for other purposes; ask your health care provider or pharmacist if you have questions. COMMON BRAND NAME(S): Onxol, Taxol What should I tell my health care provider before I take this medicine? They need to know if you have any of these conditions: -blood disorders -irregular heartbeat -infection (especially a virus infection such as chickenpox, cold sores, or herpes) -liver disease -previous or ongoing radiation therapy -an unusual or allergic reaction to paclitaxel, alcohol, polyoxyethylated castor oil, other chemotherapy agents, other medicines, foods, dyes, or preservatives -pregnant or trying to get pregnant -breast-feeding How should I use this medicine? This drug is given as an infusion into a vein. It is administered in a hospital or clinic by a specially trained health care professional. Talk to your pediatrician regarding the use of this medicine in children. Special care may be needed. Overdosage: If you think you have taken too much of this medicine contact a  poison control center or emergency room at once. NOTE: This medicine is only for you. Do not share this medicine with others. What if I miss a dose? It is important not to miss your dose. Call your doctor or health care professional if you are unable to keep an appointment. What may interact with this medicine? Do not take this medicine with any of the following medications: -disulfiram -metronidazole This medicine may also interact with the following medications: -cyclosporine -diazepam -ketoconazole -medicines to increase blood counts like filgrastim, pegfilgrastim, sargramostim -other chemotherapy drugs like cisplatin,  doxorubicin, epirubicin, etoposide, teniposide, vincristine -quinidine -testosterone -vaccines -verapamil Talk to your doctor or health care professional before taking any of these medicines: -acetaminophen -aspirin -ibuprofen -ketoprofen -naproxen This list may not describe all possible interactions. Give your health care provider a list of all the medicines, herbs, non-prescription drugs, or dietary supplements you use. Also tell them if you smoke, drink alcohol, or use illegal drugs. Some items may interact with your medicine. What should I watch for while using this medicine? Your condition will be monitored carefully while you are receiving this medicine. You will need important blood work done while you are taking this medicine. This medicine can cause serious allergic reactions. To reduce your risk you will need to take other medicine(s) before treatment with this medicine. If you experience allergic reactions like skin rash, itching or hives, swelling of the face, lips, or tongue, tell your doctor or health care professional right away. In some cases, you may be given additional medicines to help with side effects. Follow all directions for their use. This drug may make you feel generally unwell. This is not uncommon, as chemotherapy can affect healthy cells as well as cancer cells. Report any side effects. Continue your course of treatment even though you feel ill unless your doctor tells you to stop. Call your doctor or health care professional for advice if you get a fever, chills or sore throat, or other symptoms of a cold or flu. Do not treat yourself. This drug decreases your body's ability to fight infections. Try to avoid being around people who are sick. This medicine may increase your risk to bruise or bleed. Call your doctor or health care professional if you notice any unusual bleeding. Be careful brushing and flossing your teeth or using a toothpick because you may get an  infection or bleed more easily. If you have any dental work done, tell your dentist you are receiving this medicine. Avoid taking products that contain aspirin, acetaminophen, ibuprofen, naproxen, or ketoprofen unless instructed by your doctor. These medicines may hide a fever. Do not become pregnant while taking this medicine. Women should inform their doctor if they wish to become pregnant or think they might be pregnant. There is a potential for serious side effects to an unborn child. Talk to your health care professional or pharmacist for more information. Do not breast-feed an infant while taking this medicine. Men are advised not to father a child while receiving this medicine. This product may contain alcohol. Ask your pharmacist or healthcare provider if this medicine contains alcohol. Be sure to tell all healthcare providers you are taking this medicine. Certain medicines, like metronidazole and disulfiram, can cause an unpleasant reaction when taken with alcohol. The reaction includes flushing, headache, nausea, vomiting, sweating, and increased thirst. The reaction can last from 30 minutes to several hours. What side effects may I notice from receiving this medicine? Side effects that you should report to your  doctor or health care professional as soon as possible: -allergic reactions like skin rash, itching or hives, swelling of the face, lips, or tongue -low blood counts - This drug may decrease the number of white blood cells, red blood cells and platelets. You may be at increased risk for infections and bleeding. -signs of infection - fever or chills, cough, sore throat, pain or difficulty passing urine -signs of decreased platelets or bleeding - bruising, pinpoint red spots on the skin, black, tarry stools, nosebleeds -signs of decreased red blood cells - unusually weak or tired, fainting spells, lightheadedness -breathing problems -chest pain -high or low blood pressure -mouth  sores -nausea and vomiting -pain, swelling, redness or irritation at the injection site -pain, tingling, numbness in the hands or feet -slow or irregular heartbeat -swelling of the ankle, feet, hands Side effects that usually do not require medical attention (report to your doctor or health care professional if they continue or are bothersome): -bone pain -complete hair loss including hair on your head, underarms, pubic hair, eyebrows, and eyelashes -changes in the color of fingernails -diarrhea -loosening of the fingernails -loss of appetite -muscle or joint pain -red flush to skin -sweating This list may not describe all possible side effects. Call your doctor for medical advice about side effects. You may report side effects to FDA at 1-800-FDA-1088. Where should I keep my medicine? This drug is given in a hospital or clinic and will not be stored at home. NOTE: This sheet is a summary. It may not cover all possible information. If you have questions about this medicine, talk to your doctor, pharmacist, or health care provider.  2018 Elsevier/Gold Standard (2014-10-30 19:58:00)

## 2017-11-03 NOTE — Telephone Encounter (Signed)
1.Spoke with interpreter concerning patient schedule.Tried assisting her with the rescheduling of Lacie appointment back to [place] or another [facility]. Due to time conflict with the patient home schedule I Levada Dy) was unable to find a avalable slot. Spoke with Leeann Must and she stated that she will call patient concerning these appointments. 2.Interpreter stated that Burr Medico saw the patient in infusion and she directed her to come to scheduling for [place] to divide off a 8 hour infusion for the patient and schedule it. I Levada Dy) Explained that we can't  Divide chemo days and treatments off.

## 2017-11-03 NOTE — Progress Notes (Signed)
Dr. Burr Medico at bedside. Okay to treat today.

## 2017-11-03 NOTE — Progress Notes (Signed)
Spoke w/ MD about paclitaxel today - plan is to keep dose at 80 mg/m2. Patient will get granix support.  Demetrius Charity, PharmD, [facility] Pharmac[facility] Pharm[facility] Phone: [phone] 11/03/2017

## 2017-11-04 ENCOUNTER — Telehealth: Payer: Self-pay | Admitting: Hematology

## 2017-11-04 NOTE — Telephone Encounter (Signed)
Scheduled appt per 10/23 on 10/23 - okay per Melanie RN to split treatments - gave patient AVS and calender per sch message.

## 2017-11-05 ENCOUNTER — Inpatient Hospital Stay: Payer: Medicaid Other

## 2017-11-05 DIAGNOSIS — F419 Anxiety disorder, unspecified: Secondary | ICD-10-CM | POA: Diagnosis not present

## 2017-11-05 DIAGNOSIS — Z5112 Encounter for antineoplastic immunotherapy: Secondary | ICD-10-CM | POA: Diagnosis not present

## 2017-11-05 DIAGNOSIS — G893 Neoplasm related pain (acute) (chronic): Secondary | ICD-10-CM | POA: Diagnosis not present

## 2017-11-05 DIAGNOSIS — Z8614 Personal history of Methicillin resistant Staphylococcus aureus infection: Secondary | ICD-10-CM | POA: Diagnosis not present

## 2017-11-05 DIAGNOSIS — E876 Hypokalemia: Secondary | ICD-10-CM | POA: Diagnosis not present

## 2017-11-05 DIAGNOSIS — Z95828 Presence of other vascular implants and grafts: Secondary | ICD-10-CM

## 2017-11-05 DIAGNOSIS — M545 Low back pain: Secondary | ICD-10-CM | POA: Diagnosis not present

## 2017-11-05 DIAGNOSIS — G4700 Insomnia, unspecified: Secondary | ICD-10-CM | POA: Diagnosis not present

## 2017-11-05 DIAGNOSIS — C50911 Malignant neoplasm of unspecified site of right female breast: Secondary | ICD-10-CM | POA: Diagnosis not present

## 2017-11-05 DIAGNOSIS — H905 Unspecified sensorineural hearing loss: Secondary | ICD-10-CM | POA: Diagnosis not present

## 2017-11-05 DIAGNOSIS — Z923 Personal history of irradiation: Secondary | ICD-10-CM | POA: Diagnosis not present

## 2017-11-05 DIAGNOSIS — Z79811 Long term (current) use of aromatase inhibitors: Secondary | ICD-10-CM | POA: Diagnosis not present

## 2017-11-05 DIAGNOSIS — Z9221 Personal history of antineoplastic chemotherapy: Secondary | ICD-10-CM | POA: Diagnosis not present

## 2017-11-05 DIAGNOSIS — Z79899 Other long term (current) drug therapy: Secondary | ICD-10-CM | POA: Diagnosis not present

## 2017-11-05 DIAGNOSIS — Z23 Encounter for immunization: Secondary | ICD-10-CM | POA: Diagnosis not present

## 2017-11-05 DIAGNOSIS — C7951 Secondary malignant neoplasm of bone: Secondary | ICD-10-CM | POA: Diagnosis not present

## 2017-11-05 DIAGNOSIS — Z170 Estrogen receptor positive status [ER+]: Secondary | ICD-10-CM | POA: Diagnosis not present

## 2017-11-05 DIAGNOSIS — D649 Anemia, unspecified: Secondary | ICD-10-CM | POA: Diagnosis not present

## 2017-11-05 MED ORDER — TBO-FILGRASTIM 300 MCG/0.5ML ~~LOC~~ SOSY
300.0000 ug | PREFILLED_SYRINGE | Freq: Once | SUBCUTANEOUS | Status: AC
  Administered 2017-11-05: 300 ug via SUBCUTANEOUS

## 2017-11-05 MED ORDER — TBO-FILGRASTIM 300 MCG/0.5ML ~~LOC~~ SOSY
PREFILLED_SYRINGE | SUBCUTANEOUS | Status: AC
  Filled 2017-11-05: qty 0.5

## 2017-11-05 NOTE — Patient Instructions (Signed)
Tbo-Filgrastim injection What is this medicine? TBO-FILGRASTIM (T B O fil GRA stim) is a granulocyte colony-stimulating factor that stimulates the growth of neutrophils, a type of white blood cell important in the body's fight against infection. It is used to reduce the incidence of fever and infection in patients with certain types of cancer who are receiving chemotherapy that affects the bone marrow. This medicine may be used for other purposes; ask your health care provider or pharmacist if you have questions. COMMON BRAND NAME(S): Granix What should I tell my health care provider before I take this medicine? They need to know if you have any of these conditions: -bone scan or tests planned -kidney disease -sickle cell anemia -an unusual or allergic reaction to tbo-filgrastim, filgrastim, pegfilgrastim, other medicines, foods, dyes, or preservatives -pregnant or trying to get pregnant -breast-feeding How should I use this medicine? This medicine is for injection under the skin. If you get this medicine at home, you will be taught how to prepare and give this medicine. Refer to the Instructions for Use that come with your medication packaging. Use exactly as directed. Take your medicine at regular intervals. Do not take your medicine more often than directed. It is important that you put your used needles and syringes in a special sharps container. Do not put them in a trash can. If you do not have a sharps container, call your pharmacist or healthcare provider to get one. Talk to your pediatrician regarding the use of this medicine in children. Special care may be needed. Overdosage: If you think you have taken too much of this medicine contact a poison control center or emergency room at once. NOTE: This medicine is only for you. Do not share this medicine with others. What if I miss a dose? It is important not to miss your dose. Call your doctor or health care professional if you miss a  dose. What may interact with this medicine? This medicine may interact with the following medications: -medicines that may cause a release of neutrophils, such as lithium This list may not describe all possible interactions. Give your health care provider a list of all the medicines, herbs, non-prescription drugs, or dietary supplements you use. Also tell them if you smoke, drink alcohol, or use illegal drugs. Some items may interact with your medicine. What should I watch for while using this medicine? You may need blood work done while you are taking this medicine. What side effects may I notice from receiving this medicine? Side effects that you should report to your doctor or health care professional as soon as possible: -allergic reactions like skin rash, itching or hives, swelling of the face, lips, or tongue -blood in the urine -dark urine -dizziness -fast heartbeat -feeling faint -shortness of breath or breathing problems -signs and symptoms of infection like fever or chills; cough; or sore throat -signs and symptoms of kidney injury like trouble passing urine or change in the amount of urine -stomach or side pain, or pain at the shoulder -sweating -swelling of the legs, ankles, or abdomen -tiredness Side effects that usually do not require medical attention (report to your doctor or health care professional if they continue or are bothersome): -bone pain -headache -muscle pain -vomiting This list may not describe all possible side effects. Call your doctor for medical advice about side effects. You may report side effects to FDA at 1-800-FDA-1088. Where should I keep my medicine? Keep out of the reach of children. Store in a refrigerator between   2 and 8 degrees C (36 and 46 degrees F). Keep in carton to protect from light. Throw away this medicine if it is left out of the refrigerator for more than 5 consecutive days. Throw away any unused medicine after the expiration  date. NOTE: This sheet is a summary. It may not cover all possible information. If you have questions about this medicine, talk to your doctor, pharmacist, or health care provider.  2018 Elsevier/Gold Standard (2015-02-18 19:07:04)  

## 2017-11-08 NOTE — Progress Notes (Signed)
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       [name], right ([place])   11/1997 Cancer Diagnosis    Patient had 1.4 cm poorly differentiated right breast carcinoma diagnosed in Nov 1999 at age 52, 1/7 axillary nodes involved, ER/PR and HER 2 positive. She had mastectomy with the 7 axillary node evaluation, 4 cycles of adriamycin/ cytoxan followed by taxotere, then five years of tamoxifen thru June 2005 (no herceptin in 1999). She had bilateral oophorectomy in May 2005. She was briefly on aromatase inhibitor after tamoxifen, but discontinued this herself due to poor tolerance.    04/04/2015 Genetic Testing    Negative genetic testing on the breast/ovarian cancer pnael.  The Breast/Ovarian gene panel offered by [facility] includes sequencing and rearrangement analysis for the following 20 genes:  ATM, BARD1, BRCA1, BRCA2, BRIP1, CDH1, CHEK2, EPCAM, FANCC, MLH1, MSH2, MSH6, NBN, PALB2, PMS2, PTEN, RAD51C, RAD51D, TP53, and XRCC2.    06/26/2015 Pathology Results    Initial Path Report Bone, curettage, Left femur met, intramedullary subtroch tissue - METASTATIC ADENOCARCINOMA.  Estrogen Receptor: 95%, POSITIVE, STRONG STAINING INTENSITY Progesterone Receptor: 2%, POSITIVE, STRONG  HER2 (+), IHC 3+    06/26/2015 Surgery    Biopsy of metastatic tissue and stabilization with Affixus trochanteric nail, proximal and distal interlock of left femur by Dr. [name]     06/28/2015 Imaging    CT Chest w/ Contrast 1. Extensive right internal mammary chain  lymphadenopathy consistent with metastatic breast cancer. 2. Lytic metastases involving the posterior elements at T1 with probable nondisplaced pathologic fracture of the spinous process. 3. No other evidence of thoracic metastatic disease. 4. Trace bilateral pleural effusions with associated bibasilar atelectasis. 5. Indeterminate right thyroid nodule, not previously imaged. This could be evaluated with thyroid ultrasound as clinically warranted.    07/01/2015 Imaging    Bone Scan 1. Increased activity noted throughout the left femur. Although metastatic disease cannot be excluded, these changes are most likely secondary to prior surgery. 2. No other focal abnormalities identified to suggest metastatic disease.    07/12/2015 - 07/26/2015 Radiation Therapy    Left femur, 30 Gy in 10 fractions by Dr. Sondra Come     07/14/2015 Initial Diagnosis    [name], right ([place])    07/19/2015 Imaging    Initial PET Scan 1. Severe multifocal osseous metastatic disease, much greater than anticipated based on prior imaging. 2. Multifocal nodal metastases involving the right internal mammary, prevascular and subpectoral lymph nodes. No axillary pulmonary involvement identified. 3. No distant extra osseous metastases.    07/30/2015 - 11/29/2015 Chemotherapy    Docetaxel, Herceptin and Perjeta every 3 weeks X6 cycles, pt tolerated moderately well, restaging scan showed excellent response     09/18/2015 Imaging    CT Head w/wo Contrast 1. No acute intracranial abnormality or significant interval change. 2. Stable minimal periventricular white matter hypoattenuation on the right. This may reflect a remote ischemic injury. 3. No evidence for metastatic disease to the brain. 4. No focal soft tissue lesion to explain the patient's  tenderness.    10/30/2015 Imaging    CT Abdomen Pelvis w/ Contrast 1. Few mildly prominent fluid-filled loops of small bowel scattered throughout the abdomen,  with intraluminal fluid density within the distal colon. Given the provided history, findings are suggestive of acute enteritis/diarrheal illness. 2. No other acute intra-abdominal or pelvic process identified. 3. Widespread osseous metastatic disease, better evaluated on most recent PET-CT from 07/19/2015. No pathologic fracture or other complication. 4. 11 mm hypodensity within the left kidney. This lesion measures intermediate density, and is indeterminate. While this lesion is similar in size relative to recent studies, this is increased in size relative to prior study from 2012. Further evaluation with dedicated renal mass protocol CT and/or MRI is recommended for complete characterization.    12/2015 - 05/14/2016 Chemotherapy    Maintenance Herceptin and pejeta every 3 weeks stopped on 05/14/16 due to disease progression    12/25/2015 Imaging    Restaging PET Scan 1. Markedly improved skeletal activity, with only several faint foci of residual accentuated metabolic activity, but resolution of the vast majority of the previously extensive osseous metastatic disease. 2. Resolution of the prior right internal mammary and right prevascular lymph nodes. 3. Residual subcutaneous edema and edema along fascia planes in the left upper thigh. This remains somewhat more than I would expect for placement of an IM nail 6 weeks ago. If there is leg swelling further down, consider Doppler venous ultrasound to rule out left lower extremity DVT. I do not perceive an obvious difference in density between the pelvic and common femoral veins on the noncontrast CT data. 4. Chronic right maxillary and right sphenoid sinusitis.    01/16/2016 -  Anti-estrogen oral therapy    Letrozole 2.5 mg daily. She was switched to exemestane due to disease progression on 02/08/17.    03/01/2016 Miscellaneous    Patient presented to ED following a fall; she reports she landed on her back and is now experiencing back pain. The patient  was evaluated and discharge home same day with pain control.    05/01/2016 Imaging    CT CAP IMPRESSION: 1. Stable exam.  No new or progressive disease identified. 2. No evidence for residual or recurrent adenopathy within the chest. 3. Stable bone metastasis.    05/01/2016 Imaging    BONE SCAN IMPRESSION: 1. Small focal uptake involving the anterior rib ends of the left second and right fourth ribs, new since the prior bone scan. The location of this uptake is more suggestive of a traumatic or inflammatory etiology as opposed to metastatic disease. No other evidence of metastatic disease. 2. There are other areas of stable uptake which are most likely degenerative/reactive, including right shoulder and cervical spine uptake and uptake along the right femur adjacent to the ORIF hardware.    05/20/2016 Imaging    NM PET Skull to Thigh IMPRESSION: 1. There multiple metastatic foci in the bony pelvis, including some new abnormal foci compare to the prior PET-CT, compatible with mildly progressive bony metastatic disease. Also, a left T11 vertebral hypermetabolic metastatic lesion is observed, new compared to the prior exam. 2. No active extraosseous malignancy is currently identified. 3. Right anterior fourth rib healing fracture, appears be    06/04/2016 - 01/08/2017 Chemotherapy    Second line chemotherapy Kadcyla every 3 weeks started on 06/04/16 and stopped on 01/08/17 due to mixed response.      10/05/2016 Imaging    CT CAP and Bone Scan:  Bones: left intramedullary rod and left femoral  head neck screw in place. In the proximal diaphysis of the left femur there is cortical thickening and irregularity. No lytic or blastic bone lesions. No fracture or vertebral endplate destruction.   No definite evidence of recurrent disease in the chest, abdomen, or pelvis.    10/28/2016 Imaging    CT A/P IMPRESSION: No acute process demonstrated in the abdomen or pelvis. No evidence of  bowel obstruction or inflammation.    11/04/2016 Imaging    DG foot complete left: IMPRESSION: No fracture or dislocation.  No soft tissue abnormality    11/23/2016 Imaging    CT RIGHT FEMUR IMPRESSION: Negative CT scan of the right thigh.  No visible metastatic disease.  CT LEFT FEMUR IMPRESSION: 1. Postsurgical changes related to prior cephalomedullary rod fixation of the left femur with linear lucency along the anterolateral proximal femoral cortex at level of the lesser trochanter, suspicious for nondisplaced fracture. 2. Slightly permeative appearance of the proximal left femoral cortex at the site of known prior osseous metastases is largely unchanged. 3. Unchanged patchy lucency and sclerosis along the anterior acetabulum, which appears to correspond with an area of faint uptake on the prior PET-CT, suspicious for osseous metastasis. These results will be called to the ordering clinician or representative by the Radiologist Assistant, and communication documented in the PACS or zVision Dashboard.      01/27/2017 Imaging    IMPRESSION: 1. Mixed response to osseous metastasis. Some hypermetabolic lesions are new and increasingly hypermetabolic, while another index lesion is decreasingly hypermetabolic. 2. No evidence of hypermetabolic soft tissue metastasis.     02/08/2017 -  Antibody Plan    -Exemestane 25 mg daily since 02/08/17 -Herceptin every 3 week since 02/08/17 -Oral Lanpantinib 1562m and decreased to 5017mdue to diarrhea and depression. Due to disease progression she swithced to Verzenio 15057mn 05/04/17. She did not toelrate well so she was switched to Ibrance 100m59m 05/31/17. Due to her neutropenia, Ibrance dose reduced to 75 mg daily, 3 weeks on, one-week off.     04/30/2017 PET scan    IMPRESSION: 1. Primarily increase in hypermetabolism of osseous metastasis. An isolated right acetabular lesion is decreased in hypermetabolism since the prior exam. 2.  No evidence of hypermetabolic soft tissue metastasis. 3. Similar non FDG avid right-sided thyroid nodule, favoring a benign etiology. Recommend attention on follow-up.    08/30/2017 PET scan    08/30/2017 PET Scan  IMPRESSION: 1. Worsening osseous metastatic disease. 2. Focal hypermetabolism in the central spinal canal the L1 level, similar to the prior exam. Metastatic implant cannot be excluded. 3. Slight enlargement of a right thyroid nodule which now appears more cystic in character. Consider further evaluation with thyroid ultrasound. If patient is clinically hyperthyroid, consider nuclear medicine thyroid uptake and scan    08/30/2017 Progression    08/30/2017 PET Scan  IMPRESSION: 1. Worsening osseous metastatic disease. 2. Focal hypermetabolism in the central spinal canal the L1 level, similar to the prior exam. Metastatic implant cannot be excluded. 3. Slight enlargement of a right thyroid nodule which now appears more cystic in character. Consider further evaluation with thyroid ultrasound. If patient is clinically hyperthyroid, consider nuclear medicine thyroid uptake and scan    09/29/2017 - 10/11/2017 Radiation Therapy    With Dr, KinaSondra Comerted 09/29/2017 Finished 10/11/2017    10/19/2017 -  Chemotherapy    -Exemestane 25mg38mly starting 02/08/17, stopped on 10/20/2017 when she started chemo  -Herceptin every 3 weeks starting 02/08/17, perjeta added on 10/20/2017  -  weekly Taxol started on 10/21/2017     10/27/2017 Imaging    10/27/2017 CT AP IMPRESSION: 1. No acute abnormality. No evidence of bowel obstruction or acute bowel inflammation. Normal appendix. 2. Stable patchy sclerotic osseous metastases throughout the visualized skeleton. No new or progressive metastatic disease in the abdomen or pelvis.     HISTORY OF PRESENTING ILLNESS (From Dr. Mariana Kaufman note on 02/06/2016):  [name] 52 y.o. 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 She has had an excellent response to 6 cycles of taxotere herceptin perjeta from 07-30-15 thru 11-29-15. She is continuing herceptin and perjeta every 3 weeks. She had extreme local pain with first xgeva injection on 01-16-16 and prefers resuming previous zometa. She began letrozole ~ 01-16-16, has no complaints about this at all.  Last imaging was PET 12-25-15.  NOTE CTs, plain Xrays and bone scan did NOT show any of the extensively metastatic bone involvement 06-2015; MRI and PET did image disease clearly.  Patient continues to do very well overall. MRSA skin lesions on scalp and face improved quickly with doxycycline starting 01-16-16. She continues to use Dial soap and we have again strongly encouraged good hygiene with other family members at home. She has had the 3 lymphedema PT sessions allowed by insurance, continues to do massage for LLE herself, still has not gotten the ordered compression apparatus for home and will follow up with PT on that. Swelling in LLE has improved with the interventions and does not seem as uncomfortable. She denies any pain. Appetite is excellent, bowels fine, no SOB, good energy, no problems with PAC, no noted changes in left breast. No bleeding. No fever or symptoms of infection. She slipped in tub last week, fell onto right arm fortunately without injury. She was upset by a visitor and shaky then, but no tremors otherwise.   She has two children and 4 grandchildren. She has had genetic testing and it was negative. She lives at home with her husband. She is very active. Her children live close to her.   CURRENT THERAPY:    -Exemestane 40m daily starting 02/08/17, stopped on 10/20/2017 when she started chemo  -Herceptin every 3 weeks starting 02/08/17, perjeta added on 10/20/2017  -weekly Taxol started on 10/21/2017   INTERIM HISTORY:   WCHIEKO NEISES89y.o. female returns today for follow  up. Her last CT scan didn't reveal appendicitis, and the most likely cause of her RLQ was believed to be metastasis.  Today, she is here with her husband and interpreter. She is feeling tired and stressed, but denies pain. She is capable of performing daily activities at home and she takes care of her grandchildren. She lived with her daughter. She is eating well, but has noticed that food tastes like metal.  She would like to have a chemo break. She denies nausea, but complains of mild diarrhea.    MEDICAL HISTORY:  Past Medical History:  Diagnosis Date  . Abnormal Pap smear   . Anxiety   . [place]   . Cancer [place]   breast-s/p mastectomy, chemo, rad  . CIN I (cervical intraepithelial neoplasia I) 2003   by colpo  . Congenital deafness   . Deaf   . Depression   . Radiation 07/12/15-07/26/15   left femur 30 Gy  . S/P bilateral oophorectomy     SURGICAL HISTORY: Past Surgical History:  Procedure Laterality  Date  . ABDOMINAL HYSTERECTOMY    . INTRAMEDULLARY (IM) NAIL INTERTROCHANTERIC Left 06/26/2015   Procedure: LEFT BIOMET LONG AFFIXS NAIL;  Surgeon: [name], MD;  Location: [facility];  Service: Orthopedics;  Laterality: Left;  . IR GENERIC HISTORICAL  08/06/2015   IR US GUIDE VASC ACCESS LEFT 08/06/2015 Aletta Edouard, MD [facility]  . IR GENERIC HISTORICAL  08/06/2015   IR FLUORO GUIDE CV LINE LEFT 08/06/2015 Aletta Edouard, MD [facility]  . IR GENERIC HISTORICAL  03/05/2016   IR CV LINE INJECTION 03/05/2016 Markus Daft, MD [facility]  . MASTECTOMY     right breast  . MASTECTOMY    . OVARIAN CYST REMOVAL    . RADIOLOGY WITH ANESTHESIA Left 06/25/2015   Procedure: MRI OF LEFT HIP WITH OR WITHOUT CONTRAST;  Surgeon: Medication Radiologist, MD;  Location: [facility];  Service: Radiology;  Laterality: Left;  DR. MCINTYRE/MRI  . TUBAL LIGATION      SOCIAL HISTORY: Social History   Socioeconomic History  . Marital status: Married    Spouse name: Not on file  .  Number of children: 2  . Years of education: Not on file  . Highest education level: Not on file  Occupational History  . Not on file  Social Needs  . Financial resource strain: Not on file  . Food insecurity:    Worry: Not on file    Inability: Not on file  . Transportation needs:    Medical: Not on file    Non-medical: Not on file  Tobacco Use  . Smoking status: Never Smoker  . Smokeless tobacco: Never Used  Substance and Sexual Activity  . Alcohol use: No  . Drug use: No  . Sexual activity: Yes    Birth control/protection: Surgical  Lifestyle  . Physical activity:    Days per week: Not on file    Minutes per session: Not on file  . Stress: Not on file  Relationships  . Social connections:    Talks on phone: Not on file    Gets together: Not on file    Attends religious service: Not on file    Active member of club or organization: Not on file    Attends meetings of clubs or organizations: Not on file    Relationship status: Not on file  . Intimate partner violence:    Fear of current or ex partner: Not on file    Emotionally abused: Not on file    Physically abused: Not on file    Forced sexual activity: Not on file  Other Topics Concern  . Not on file  Social History Narrative  . Not on file    FAMILY HISTORY: Family History  Problem Relation Age of Onset  . Hypertension Mother   . Seizures Mother   . Alzheimer's disease Maternal Uncle   . Pancreatitis Maternal Grandmother   . Heart attack Maternal Grandfather     ALLERGIES:  is allergic to chlorhexidine; promethazine hcl; and diphenhydramine hcl.  MEDICATIONS:  Current Outpatient Medications  Medication Sig Dispense Refill  . ALPRAZolam (XANAX) 0.25 MG tablet Take 1 tablet (0.25 mg total) by mouth 2 (two) times daily as needed for anxiety. 30 tablet 0  . divalproex (DEPAKOTE) 250 MG DR tablet Take 1 tablet (250 mg total) by mouth at bedtime. 30 tablet 2  . HYDROcodone-acetaminophen (NORCO) 10-325 MG  tablet Take 1 tablet by mouth every 6 (six) hours as needed. 45 tablet 0  . lidocaine-prilocaine (  EMLA) cream Apply 1 application topically as needed. Apply to Porta-Cath 1-2 hours prior to access as directed. 30 g 2  . loperamide (IMODIUM) 2 MG capsule Take 2 capsules (4 mg total) by mouth 3 (three) times daily. May take additional tablets daily to titrate to 1-2 bowel movements each day 200 capsule 2  . ondansetron (ZOFRAN) 8 MG tablet Take 1 tablet (8 mg total) by mouth 2 (two) times daily as needed (Nausea or vomiting). 30 tablet 1  . potassium chloride (K-DUR) 10 MEQ tablet Take 1 tablet (10 mEq total) by mouth 2 (two) times daily. 60 tablet 0  . prochlorperazine (COMPAZINE) 10 MG tablet Take 1 tablet (10 mg total) by mouth every 6 (six) hours as needed for nausea or vomiting. 30 tablet 2   No current facility-administered medications for this visit.    Facility-Administered Medications Ordered in Other Visits  Medication Dose Route Frequency Provider Last Rate Last Dose  . heparin lock flush 100 unit/mL  500 Units Intracatheter Once PRN Truitt Merle, MD      . loratadine (CLARITIN) tablet 10 mg  10 mg Oral Daily Truitt Merle, MD   10 mg at 11/10/17 1007  . PACLitaxel (TAXOL) 132 mg in sodium chloride 0.9 % 250 mL chemo infusion (</= 70m/m2)  80 mg/m2 (Treatment Plan Recorded) Intravenous Once FTruitt Merle MD      . pertuzumab (PERJETA) 420 mg in sodium chloride 0.9 % 250 mL chemo infusion  420 mg Intravenous Once FTruitt Merle MD      . sodium chloride 0.9 % 1,000 mL with potassium chloride 10 mEq infusion   Intravenous Continuous LGordy Levan MD   Stopped at 09/10/15 1653  . sodium chloride 0.9 % injection 10 mL  10 mL Intravenous PRN Livesay, Lennis P, MD      . sodium chloride flush (NS) 0.9 % injection 10 mL  10 mL Intracatheter PRN FTruitt Merle MD      . trastuzumab (HERCEPTIN) 336 mg in sodium chloride 0.9 % 250 mL chemo infusion  6 mg/kg (Treatment Plan Recorded) Intravenous Once FTruitt Merle  MD        REVIEW OF SYSTEMS:  Constitutional: Denies fevers, chills or abnormal night sweats (+) fatigue (+) stressed  Eyes: Denies blurriness of vision, double vision or watery eyes Ears, nose, mouth, throat, and face: Denies mucositis or sore throat (+) deaf Respiratory: Denies cough, dyspnea or wheezes  Cardiovascular: Denies palpitation, chest discomfort or lower extremity swelling Skin: No rashes or discolorations  Gastrointestinal:  No nausea (+) mild diarrhea  Lymphatics: Denies new lymphadenopathy or easy bruising. MSK: No new joint or bone pain Behavioral/Psych: No new changes  All other systems were reviewed with the patient and are negative.  PHYSICAL EXAMINATION:  ECOG PERFORMANCE STATUS: 1 Vitals:   11/10/17 0901  BP: 122/69  Pulse: 73  Resp: 18  Temp: 97.9 F (36.6 C)  TempSrc: Oral  SpO2: 100%  Weight: 127 lb (57.6 kg)  Height: 5' 7"  (1.702 m)    GENERAL:alert, no distress and comfortable  EYES: normal, conjunctiva are pink and non-injected, sclera clear OROPHARYNX:no exudate, no erythema and lips, buccal mucosa, and tongue normal  NECK: supple, thyroid normal size, non-tender, without nodularity LYMPH:  no palpable lymphadenopathy in the cervical, axillary or inguinal LUNGS: clear to auscultation and percussion with normal breathing effort HEART: regular rate & rhythm and no murmurs and no lower extremity edema ABDOMEN:abdomen soft, No organomegaly. (+) very mild RLQ abdominal pain  Musculoskeletal:no cyanosis of digits and no clubbing  PSYCH: alert & oriented x 3 with fluent speech NEURO: no focal motor/sensory deficits   LABORATORY DATA:  I have reviewed the data as listed CBC Latest Ref Rng & Units 11/10/2017 11/03/2017 10/27/2017  WBC 4.0 - 10.5 K/uL 3.5(L) 1.9(L) 2.7(L)  Hemoglobin 12.0 - 15.0 g/dL 10.8(L) 10.8(L) 11.2(L)  Hematocrit 36.0 - 46.0 % 31.5(L) 31.4(L) 33.1(L)  Platelets 150 - 400 K/uL 166 170 158   CMP Latest Ref Rng & Units  11/10/2017 11/03/2017 10/27/2017  Glucose 70 - 99 mg/dL 87 89 87  BUN 6 - 20 mg/dL 9 12 9   Creatinine 0.44 - 1.00 mg/dL 0.83 0.82 0.78  Sodium 135 - 145 mmol/L 143 142 140  Potassium 3.5 - 5.1 mmol/L 3.3(L) 3.4(L) 3.6  Chloride 98 - 111 mmol/L 107 108 106  CO2 22 - 32 mmol/L 25 23 25   Calcium 8.9 - 10.3 mg/dL 9.0 8.9 9.0  Total Protein 6.5 - 8.1 g/dL 6.4(L) 6.4(L) 6.5  Total Bilirubin 0.3 - 1.2 mg/dL 1.9(H) 2.1(H) 3.1(H)  Alkaline Phos 38 - 126 U/L 67 67 65  AST 15 - 41 U/L 14(L) 16 16  ALT 0 - 44 U/L 18 17 25     TUMOR MARKER  CA 27.29 10/08/17: 62.0   PROCEDURES: 08/04/2017 ECHO Study Conclusions  - Left ventricle: Inferobasal hypokinesis. The cavity size was   normal. The estimated ejection fraction was 55%. Wall motion was   normal; there were no regional wall motion abnormalities. Left   ventricular diastolic function parameters were normal. - Mitral valve: Calcified annulus. Mildly thickened leaflets . - Atrial septum: No defect or patent foramen ovale was identified. - Impressions: Normal GLS-21.2  RADIOGRAPHIC STUDIES:  10/27/2017 CT AP IMPRESSION: 1. No acute abnormality. No evidence of bowel obstruction or acute bowel inflammation. Normal appendix. 2. Stable patchy sclerotic osseous metastases throughout the visualized skeleton. No new or progressive metastatic disease in the abdomen or pelvis.   08/30/2017 PET Scan  IMPRESSION: 1. Worsening osseous metastatic disease. 2. Focal hypermetabolism in the central spinal canal the L1 level, similar to the prior exam. Metastatic implant cannot be excluded. 3. Slight enlargement of a right thyroid nodule which now appears more cystic in character. Consider further evaluation with thyroid ultrasound. If patient is clinically hyperthyroid, consider nuclear medicine thyroid uptake and scan.  PET Scan 04/30/17 IMPRESSION: 1. Primarily increase in hypermetabolism of osseous metastasis. An isolated right acetabular  lesion is decreased in hypermetabolism since the prior exam. 2. No evidence of hypermetabolic soft tissue metastasis. 3. Similar non FDG avid right-sided thyroid nodule, favoring a benign etiology. Recommend attention on follow-up.  PET SCAN: 01/27/17 IMPRESSION: 1. Mixed response to osseous metastasis. Some hypermetabolic lesions are new and increasingly hypermetabolic, while another index lesion is decreasingly hypermetabolic. 2. No evidence of hypermetabolic soft tissue metastasis.  CT A/P IMPRESSION: 10/28/16 No acute process demonstrated in the abdomen or pelvis. No evidence of bowel obstruction or inflammation.  CT CAP and Bone Scan: 10/05/16  Bones: left intramedullary rod and left femoral head neck screw in place. In the proximal diaphysis of the left femur there is cortical thickening and irregularity. No lytic or blastic bone lesions. No fracture or vertebral endplate destruction.  No definite evidence of recurrent disease in the chest, abdomen, or pelvis.  PET 05/20/2016 IMPRESSION: 1. There multiple metastatic foci in the bony pelvis, including some new abnormal foci compare to the prior PET-CT, compatible with mildly progressive bony metastatic disease. Also, a  left T11 vertebral hypermetabolic metastatic lesion is observed, new compared to the prior exam. 2. No active extraosseous malignancy is currently identified. 3. Right anterior fourth rib healing fracture, appears benign.  NM Bone Scan 05/01/2016 IMPRESSION: 1. Small focal uptake involving the anterior rib ends of the left second and right fourth ribs, new since the prior bone scan. The location of this uptake is more suggestive of a traumatic or inflammatory etiology as opposed to metastatic disease. No other evidence of metastatic disease. 2. There are other areas of stable uptake which are most likely degenerative/reactive, including right shoulder and cervical spine uptake and uptake along the right femur adjacent to  the ORIF Hardware.  CT CAP 05/01/2016 IMPRESSION: 1. Stable exam.  No new or progressive disease identified. 2. No evidence for residual or recurrent adenopathy within the chest. 3. Stable bone metastasis.  I have personally reviewed the radiological images as listed and agreed with the findings in the report.  ASSESSMENT & PLAN:  [name] is a 52 y.o. postmenopause female with:  1. 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 -Restaging PET 12-25-15 markedly improved of bone metastasis after initial systemic chemo  -she was switched to letrozole and maintenance Herceptin and the perjeta on 01/16/16 which stopped on 05/14/16.  -Did not tolerate SQ xgeva and it was changed to zometa in 06/2015 -Continue monitoring ECHO every 3 months when she is on herceptin -I previously reviewed her CT scan and bone scan from 05/01/2016, which showed stable disease overall, a few new uptake of anterior ribs on bone scan are indeterminate. -I previously discussed her restaging PET scan from 05/20/2016. Unfortunately it showed mild disease progression of her bone metastasis  -I have changed her treatment to second line Kadcyla and letrozole on 06/04/16, she is tolerating very well. She declined other chemo.  -We previously received records from [facility] in [place], [place]. She underwent CT CAP w contrast on 10/02/16 and bone scan on 09/21/16. CT shows no evidence of recurrent disease in the chest, abdomen, or pelvis; Bone scan indicates no lytic or blastic bone lesions.  -She has been on Zometa every 4-6 weeks since June 2017. We previously changed to every 3 months in 12/2016. -PET Scan from 01/27/17 images were reviewed in person, it shows a mixed response to osseous metastasis, most bone lesions have improved comparing to 05/2016, but she has developed one new  spine metastasis, and involved her pelvic bone lesion slightly increased in metabolism.  No other visceral organ metastasis. She is clinically doing well.  -Kadcyla was switched to Exemestane, oral HER2 antibody treatment lapatinib and herceptin starting 02/08/17. -She tried lapatinib, could not tolerate and stopped. -Her PET Scan from 04/30/17 revealed a primarily increase in hypermetabolism of osseous metastasis. There are no new lesions.  I reviewed the images and compared to the previous images in person with patient and her husband. I suspect her progression is due to her not being able to fully tolerate lapatinib -I previously discussed options with clinical trial for the new Her2 antibody conjugate Trastuzumab deruxtecan, the phase 3 clinical trial has been open, and the closest site is in [place] and [employer]. She is not interested in going to [place] at this time. -She did not respond to CDK4/6 inhibitor -Her 08/30/2017 PET Scan showed disease progression in her bones, no other new visceral metastasis. I reviewed  her PET images with her in person. She denies hip or pelvic pain and has stable right shoulder and left hip pain from before.  -Will stop Ibrance due to disease progression, and started her on Neratinib -She tolerated neratinib very poorly, with diarrhea, nausea, vomiting, taste change and poor appetite. I stopped it previously. --Due to her disease progression, I recommend her to start chemotherapy again with weekly Taxol, along with Herceptin and pejeta every 3 weeks on 10/20/2017 -She is doing well, but complains of fatigue and mild diarrhea. Her RLQ pain resolved. -Labs reviewed, CBC showed Hg 10.8 WBC 3.5. CMP showed K 3.3.  Adequate for treatment, will proceed Taxol and Herceptin, Perjeta today.  If her ANC drops again, will use Granix as needed. -We discussed chemo break if her fatigue gets worse -f/u in 2 weeks.  2. History of MRSA skin infection scalp, boil at low back scalp  and upper lip -I prescribed doxycycline on 04/24/16 for 7 days  -Resolved now.  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  -Husband also deaf.  4. premature surgical menopause -post BSO 2042fr pelvic pain and ovarian cysts, likely was also beneficial from standpoint of the breast cancer. -hysterectomy   5. Intermittent anemia  -mild, possible related to prior chemo -She is asymptomatic, we'll continue close monitoring  6. Hypokalemia  -She has had intermittent hypokalemia --She is only taking oral potassium 10 mEq twice daily daily, will continue  -CMP showed K 3.3   7. Goal of care discussion  -We previously discussed the incurable nature of her cancer, and the overall poor prognosis, especially if she does not have good response to chemotherapy or progress on chemo.  -The patient understands the goal of care is palliative. -she is full code for now   8. RLQ abdominal pain  -It started about 2 days ago, she is quite tender in the right lower quadrant of abdomen, and have some rebound pain.  She states the pain is different from previous right hip pain. - CT abdomen pelvis didn't show appendicitis. The pain is likely from bone metastasis.  -resolved now  9. Bilateral Leg Pain -She initially presented with worsening left leg and hip pain secondary to bone mets, she had surgery before, this was much improved after her prior chemo -The patient previously took [place] this pain, 1 tablet every 6 hours as needed for pain. Which helped. -I previously encouraged her to follow up with Dr. YLorin Mercyabout the pain in her left leg. 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  10. Insomnia, Depression, Stress   -I previously gave her Ambien 528m but it did not work well.  -continue ativan 1 mg to take at night as  needed  -mirtazapine did not work, she also tried trazodone from PCP which gave her a rash. She stopped taking it.  -After the death of her dog she has been stressed and sad from how it happened.  -I previously offered her the chance to speak with our counselor or a therapist, she declined at this time. She denied inclinations of self harm.  -I also previously offered her the choice of antidepressants. She will try coping without medication for now. I previously encouraged her to speak with family or friends about her feelings.  -she started xanax in late Oct for worsening anxiety, better now  11.  Hyperbilirubinemia  -She has developed mild hyperbilirubinemia recently, TBIL 1.2-2.7, okay to continue treatment  -increased to 2.8 on 08/31/2017 -improved, 1.9 today     Plan:  -Lab reviewed, adequate for treatment, will proceed Taxol, Perjeta and Herceptin today, and continue Taxol weekly  -f/u in 2 weeks  Pt had many questions. All questions were answered. The patient knows to call the clinic with any problems, questions or concerns.  I spent 20 minutes counseling the patient face to face. The total time spent in the appointment was 25 minutes and more than 50% was on counseling.  Dierdre Searles Dweik am acting as scribe for Dr. Truitt Merle.  I have reviewed the above documentation for accuracy and completeness, and I agree with the above.    Truitt Merle  11/10/2017 10:46 AM

## 2017-11-09 ENCOUNTER — Other Ambulatory Visit: Payer: Medicaid Other

## 2017-11-09 ENCOUNTER — Ambulatory Visit: Payer: Medicaid Other | Admitting: Nurse Practitioner

## 2017-11-10 ENCOUNTER — Inpatient Hospital Stay: Payer: Medicaid Other

## 2017-11-10 ENCOUNTER — Other Ambulatory Visit: Payer: Medicaid Other

## 2017-11-10 ENCOUNTER — Inpatient Hospital Stay (HOSPITAL_BASED_OUTPATIENT_CLINIC_OR_DEPARTMENT_OTHER): Payer: Medicaid Other | Admitting: Hematology

## 2017-11-10 ENCOUNTER — Ambulatory Visit: Payer: Medicaid Other

## 2017-11-10 ENCOUNTER — Ambulatory Visit: Payer: Medicaid Other | Admitting: Nurse Practitioner

## 2017-11-10 ENCOUNTER — Encounter: Payer: Self-pay | Admitting: Hematology

## 2017-11-10 VITALS — BP 122/69 | HR 73 | Temp 97.9°F | Resp 18 | Ht 67.0 in | Wt 127.0 lb

## 2017-11-10 VITALS — BP 110/68 | HR 71 | Temp 97.7°F | Resp 16

## 2017-11-10 DIAGNOSIS — Z79811 Long term (current) use of aromatase inhibitors: Secondary | ICD-10-CM | POA: Diagnosis not present

## 2017-11-10 DIAGNOSIS — G4700 Insomnia, unspecified: Secondary | ICD-10-CM

## 2017-11-10 DIAGNOSIS — F419 Anxiety disorder, unspecified: Secondary | ICD-10-CM | POA: Diagnosis not present

## 2017-11-10 DIAGNOSIS — Z95828 Presence of other vascular implants and grafts: Secondary | ICD-10-CM

## 2017-11-10 DIAGNOSIS — C7951 Secondary malignant neoplasm of bone: Secondary | ICD-10-CM | POA: Diagnosis not present

## 2017-11-10 DIAGNOSIS — Z23 Encounter for immunization: Secondary | ICD-10-CM | POA: Diagnosis not present

## 2017-11-10 DIAGNOSIS — Z8614 Personal history of Methicillin resistant Staphylococcus aureus infection: Secondary | ICD-10-CM | POA: Diagnosis not present

## 2017-11-10 DIAGNOSIS — D649 Anemia, unspecified: Secondary | ICD-10-CM

## 2017-11-10 DIAGNOSIS — Z170 Estrogen receptor positive status [ER+]: Secondary | ICD-10-CM

## 2017-11-10 DIAGNOSIS — Z79899 Other long term (current) drug therapy: Secondary | ICD-10-CM

## 2017-11-10 DIAGNOSIS — C799 Secondary malignant neoplasm of unspecified site: Principal | ICD-10-CM

## 2017-11-10 DIAGNOSIS — Z9221 Personal history of antineoplastic chemotherapy: Secondary | ICD-10-CM | POA: Diagnosis not present

## 2017-11-10 DIAGNOSIS — C50911 Malignant neoplasm of unspecified site of right female breast: Secondary | ICD-10-CM

## 2017-11-10 DIAGNOSIS — C50919 Malignant neoplasm of unspecified site of unspecified female breast: Secondary | ICD-10-CM

## 2017-11-10 DIAGNOSIS — M545 Low back pain: Secondary | ICD-10-CM

## 2017-11-10 DIAGNOSIS — E876 Hypokalemia: Secondary | ICD-10-CM

## 2017-11-10 DIAGNOSIS — G893 Neoplasm related pain (acute) (chronic): Secondary | ICD-10-CM | POA: Diagnosis not present

## 2017-11-10 DIAGNOSIS — Z923 Personal history of irradiation: Secondary | ICD-10-CM

## 2017-11-10 DIAGNOSIS — Z5112 Encounter for antineoplastic immunotherapy: Secondary | ICD-10-CM | POA: Diagnosis not present

## 2017-11-10 DIAGNOSIS — H905 Unspecified sensorineural hearing loss: Secondary | ICD-10-CM | POA: Diagnosis not present

## 2017-11-10 LAB — COMPREHENSIVE METABOLIC PANEL
ALT: 18 U/L (ref 0–44)
AST: 14 U/L — AB (ref 15–41)
Albumin: 3.5 g/dL (ref 3.5–5.0)
Alkaline Phosphatase: 67 U/L (ref 38–126)
Anion gap: 11 (ref 5–15)
BUN: 9 mg/dL (ref 6–20)
CO2: 25 mmol/L (ref 22–32)
CREATININE: 0.83 mg/dL (ref 0.44–1.00)
Calcium: 9 mg/dL (ref 8.9–10.3)
Chloride: 107 mmol/L (ref 98–111)
GFR calc Af Amer: >60 mL/min (ref >60)
Glucose, Bld: 87 mg/dL (ref 70–99)
POTASSIUM: 3.3 mmol/L — AB (ref 3.5–5.1)
Sodium: 143 mmol/L (ref 135–145)
Total Bilirubin: 1.9 mg/dL — ABNORMAL HIGH (ref 0.3–1.2)
Total Protein: 6.4 g/dL — ABNORMAL LOW (ref 6.5–8.1)

## 2017-11-10 LAB — CBC WITH DIFFERENTIAL/PLATELET
ABS IMMATURE GRANULOCYTES: 0.47 10*3/uL — AB (ref 0.00–0.07)
Basophils Absolute: 0 10*3/uL (ref 0.0–0.1)
Basophils Relative: 1 %
Eosinophils Absolute: 0.1 10*3/uL (ref 0.0–0.5)
Eosinophils Relative: 1 %
HEMATOCRIT: 31.5 % — AB (ref 36.0–46.0)
HEMOGLOBIN: 10.8 g/dL — AB (ref 12.0–15.0)
Immature Granulocytes: 13 %
Lymphocytes Relative: 19 %
Lymphs Abs: 0.7 10*3/uL (ref 0.7–4.0)
MCH: 31.6 pg (ref 26.0–34.0)
MCHC: 34.3 g/dL (ref 30.0–36.0)
MCV: 92.1 fL (ref 80.0–100.0)
MONO ABS: 0.3 10*3/uL (ref 0.1–1.0)
MONOS PCT: 8 %
Neutro Abs: 2 10*3/uL (ref 1.7–7.7)
Neutrophils Relative %: 58 %
Platelets: 166 10*3/uL (ref 150–400)
RBC: 3.42 MIL/uL — AB (ref 3.87–5.11)
RDW: 11.9 % (ref 11.5–15.5)
WBC: 3.5 10*3/uL — AB (ref 4.0–10.5)
nRBC: 0.6 % — ABNORMAL HIGH (ref 0.0–0.2)

## 2017-11-10 MED ORDER — SODIUM CHLORIDE 0.9 % IV SOLN
420.0000 mg | Freq: Once | INTRAVENOUS | Status: AC
  Administered 2017-11-10: 420 mg via INTRAVENOUS
  Filled 2017-11-10: qty 14

## 2017-11-10 MED ORDER — TRASTUZUMAB CHEMO 150 MG IV SOLR
6.0000 mg/kg | Freq: Once | INTRAVENOUS | Status: AC
  Administered 2017-11-10: 336 mg via INTRAVENOUS
  Filled 2017-11-10: qty 16

## 2017-11-10 MED ORDER — SODIUM CHLORIDE 0.9% FLUSH
10.0000 mL | INTRAVENOUS | Status: DC | PRN
  Administered 2017-11-10: 10 mL
  Filled 2017-11-10: qty 10

## 2017-11-10 MED ORDER — ACETAMINOPHEN 325 MG PO TABS
ORAL_TABLET | ORAL | Status: AC
  Filled 2017-11-10: qty 2

## 2017-11-10 MED ORDER — SODIUM CHLORIDE 0.9 % IV SOLN
80.0000 mg/m2 | Freq: Once | INTRAVENOUS | Status: DC
  Filled 2017-11-10: qty 22

## 2017-11-10 MED ORDER — ACETAMINOPHEN 325 MG PO TABS
650.0000 mg | ORAL_TABLET | Freq: Once | ORAL | Status: AC
  Administered 2017-11-10: 650 mg via ORAL

## 2017-11-10 MED ORDER — LORATADINE 10 MG PO TABS
ORAL_TABLET | ORAL | Status: AC
  Filled 2017-11-10: qty 1

## 2017-11-10 MED ORDER — DEXAMETHASONE SODIUM PHOSPHATE 10 MG/ML IJ SOLN
10.0000 mg | Freq: Once | INTRAMUSCULAR | Status: AC
  Administered 2017-11-10: 10 mg via INTRAVENOUS

## 2017-11-10 MED ORDER — LORATADINE 10 MG PO TABS
10.0000 mg | ORAL_TABLET | Freq: Every day | ORAL | Status: DC
  Administered 2017-11-10: 10 mg via ORAL

## 2017-11-10 MED ORDER — DEXAMETHASONE SODIUM PHOSPHATE 10 MG/ML IJ SOLN
INTRAMUSCULAR | Status: AC
  Filled 2017-11-10: qty 1

## 2017-11-10 MED ORDER — HEPARIN SOD (PORK) LOCK FLUSH 100 UNIT/ML IV SOLN
500.0000 [IU] | Freq: Once | INTRAVENOUS | Status: AC | PRN
  Administered 2017-11-10: 500 [IU]
  Filled 2017-11-10: qty 5

## 2017-11-10 MED ORDER — SODIUM CHLORIDE 0.9 % IJ SOLN
10.0000 mL | INTRAMUSCULAR | Status: DC | PRN
  Administered 2017-11-10: 10 mL via INTRAVENOUS
  Filled 2017-11-10: qty 10

## 2017-11-10 MED ORDER — FAMOTIDINE IN NACL 20-0.9 MG/50ML-% IV SOLN
INTRAVENOUS | Status: AC
  Filled 2017-11-10: qty 50

## 2017-11-10 MED ORDER — SODIUM CHLORIDE 0.9 % IV SOLN
Freq: Once | INTRAVENOUS | Status: AC
  Administered 2017-11-10: 10:00:00 via INTRAVENOUS
  Filled 2017-11-10: qty 250

## 2017-11-10 MED ORDER — FAMOTIDINE IN NACL 20-0.9 MG/50ML-% IV SOLN
20.0000 mg | Freq: Once | INTRAVENOUS | Status: AC
  Administered 2017-11-10: 20 mg via INTRAVENOUS

## 2017-11-10 NOTE — Progress Notes (Signed)
Pt declines 30 minutes of observation. VSS

## 2017-11-10 NOTE — Patient Instructions (Addendum)
    [facility] Discharge Instructions for Patients Receiving Chemotherapy  Today you received the following chemotherapy agents Herceptin &  Perjeta To help prevent nausea and vomiting after your treatment, we encourage you to take your nausea medication as prescribed.   If you develop nausea and vomiting that is not controlled by your nausea medication, call the clinic.   BELOW ARE SYMPTOMS THAT SHOULD BE REPORTED IMMEDIATELY:  *FEVER GREATER THAN 100.5 F  *CHILLS WITH OR WITHOUT FEVER  NAUSEA AND VOMITING THAT IS NOT CONTROLLED WITH YOUR NAUSEA MEDICATION  *UNUSUAL SHORTNESS OF BREATH  *UNUSUAL BRUISING OR BLEEDING  TENDERNESS IN MOUTH AND THROAT WITH OR WITHOUT PRESENCE OF ULCERS  *URINARY PROBLEMS  *BOWEL PROBLEMS  UNUSUAL RASH Items with * indicate a potential emergency and should be followed up as soon as possible.  Feel free to call the clinic should you have any questions or concerns. The clinic phone number is [phone].  Please show the [facility] at check-in to the Emergency Department and triage nurse.

## 2017-11-10 NOTE — Progress Notes (Signed)
Per Dr. Burr Medico okay to treat with Bili 1.9 notified Infusion notified

## 2017-11-11 ENCOUNTER — Telehealth: Payer: Self-pay | Admitting: Hematology

## 2017-11-11 ENCOUNTER — Inpatient Hospital Stay: Payer: Medicaid Other

## 2017-11-11 VITALS — BP 99/53 | HR 76 | Temp 97.8°F | Resp 16

## 2017-11-11 DIAGNOSIS — Z5112 Encounter for antineoplastic immunotherapy: Secondary | ICD-10-CM | POA: Diagnosis not present

## 2017-11-11 DIAGNOSIS — C50911 Malignant neoplasm of unspecified site of right female breast: Secondary | ICD-10-CM

## 2017-11-11 DIAGNOSIS — D649 Anemia, unspecified: Secondary | ICD-10-CM | POA: Diagnosis not present

## 2017-11-11 DIAGNOSIS — C7951 Secondary malignant neoplasm of bone: Secondary | ICD-10-CM | POA: Diagnosis not present

## 2017-11-11 DIAGNOSIS — G4700 Insomnia, unspecified: Secondary | ICD-10-CM | POA: Diagnosis not present

## 2017-11-11 DIAGNOSIS — Z170 Estrogen receptor positive status [ER+]: Secondary | ICD-10-CM | POA: Diagnosis not present

## 2017-11-11 DIAGNOSIS — F419 Anxiety disorder, unspecified: Secondary | ICD-10-CM | POA: Diagnosis not present

## 2017-11-11 DIAGNOSIS — Z923 Personal history of irradiation: Secondary | ICD-10-CM | POA: Diagnosis not present

## 2017-11-11 DIAGNOSIS — E876 Hypokalemia: Secondary | ICD-10-CM | POA: Diagnosis not present

## 2017-11-11 DIAGNOSIS — M545 Low back pain: Secondary | ICD-10-CM | POA: Diagnosis not present

## 2017-11-11 DIAGNOSIS — Z79811 Long term (current) use of aromatase inhibitors: Secondary | ICD-10-CM | POA: Diagnosis not present

## 2017-11-11 DIAGNOSIS — H905 Unspecified sensorineural hearing loss: Secondary | ICD-10-CM | POA: Diagnosis not present

## 2017-11-11 DIAGNOSIS — Z8614 Personal history of Methicillin resistant Staphylococcus aureus infection: Secondary | ICD-10-CM | POA: Diagnosis not present

## 2017-11-11 DIAGNOSIS — Z9221 Personal history of antineoplastic chemotherapy: Secondary | ICD-10-CM | POA: Diagnosis not present

## 2017-11-11 DIAGNOSIS — Z23 Encounter for immunization: Secondary | ICD-10-CM | POA: Diagnosis not present

## 2017-11-11 DIAGNOSIS — Z79899 Other long term (current) drug therapy: Secondary | ICD-10-CM | POA: Diagnosis not present

## 2017-11-11 DIAGNOSIS — G893 Neoplasm related pain (acute) (chronic): Secondary | ICD-10-CM | POA: Diagnosis not present

## 2017-11-11 MED ORDER — HEPARIN SOD (PORK) LOCK FLUSH 100 UNIT/ML IV SOLN
500.0000 [IU] | Freq: Once | INTRAVENOUS | Status: AC | PRN
  Administered 2017-11-11: 500 [IU]
  Filled 2017-11-11: qty 5

## 2017-11-11 MED ORDER — FAMOTIDINE IN NACL 20-0.9 MG/50ML-% IV SOLN
INTRAVENOUS | Status: AC
  Filled 2017-11-11: qty 50

## 2017-11-11 MED ORDER — FAMOTIDINE IN NACL 20-0.9 MG/50ML-% IV SOLN
20.0000 mg | Freq: Once | INTRAVENOUS | Status: AC
  Administered 2017-11-11: 20 mg via INTRAVENOUS

## 2017-11-11 MED ORDER — SODIUM CHLORIDE 0.9% FLUSH
10.0000 mL | INTRAVENOUS | Status: DC | PRN
  Administered 2017-11-11: 10 mL
  Filled 2017-11-11: qty 10

## 2017-11-11 MED ORDER — SODIUM CHLORIDE 0.9 % IV SOLN
80.0000 mg/m2 | Freq: Once | INTRAVENOUS | Status: AC
  Administered 2017-11-11: 132 mg via INTRAVENOUS
  Filled 2017-11-11: qty 22

## 2017-11-11 MED ORDER — DEXAMETHASONE SODIUM PHOSPHATE 10 MG/ML IJ SOLN
10.0000 mg | Freq: Once | INTRAMUSCULAR | Status: AC
  Administered 2017-11-11: 10 mg via INTRAVENOUS

## 2017-11-11 MED ORDER — SODIUM CHLORIDE 0.9 % IV SOLN
Freq: Once | INTRAVENOUS | Status: AC
  Administered 2017-11-11: 09:00:00 via INTRAVENOUS
  Filled 2017-11-11: qty 250

## 2017-11-11 MED ORDER — DEXAMETHASONE SODIUM PHOSPHATE 10 MG/ML IJ SOLN
INTRAMUSCULAR | Status: AC
  Filled 2017-11-11: qty 1

## 2017-11-11 NOTE — Patient Instructions (Signed)
[facility] Discharge Instructions for Patients Receiving Chemotherapy  Today you received the following chemotherapy agents:  Taxol.  To help prevent nausea and vomiting after your treatment, we encourage you to take your nausea medication as directed.   If you develop nausea and vomiting that is not controlled by your nausea medication, call the clinic.   BELOW ARE SYMPTOMS THAT SHOULD BE REPORTED IMMEDIATELY:  *FEVER GREATER THAN 100.5 F  *CHILLS WITH OR WITHOUT FEVER  NAUSEA AND VOMITING THAT IS NOT CONTROLLED WITH YOUR NAUSEA MEDICATION  *UNUSUAL SHORTNESS OF BREATH  *UNUSUAL BRUISING OR BLEEDING  TENDERNESS IN MOUTH AND THROAT WITH OR WITHOUT PRESENCE OF ULCERS  *URINARY PROBLEMS  *BOWEL PROBLEMS  UNUSUAL RASH Items with * indicate a potential emergency and should be followed up as soon as possible.  Feel free to call the clinic should you have any questions or concerns. The clinic phone number is [phone].  Please show the CHEMO ALERT CARD at check-in to the Emergency Department and triage nurse.   

## 2017-11-11 NOTE — Telephone Encounter (Signed)
No los 10/30

## 2017-11-17 ENCOUNTER — Inpatient Hospital Stay: Payer: Medicaid Other | Attending: Hematology

## 2017-11-17 ENCOUNTER — Inpatient Hospital Stay: Payer: Medicaid Other

## 2017-11-17 ENCOUNTER — Encounter: Payer: Self-pay | Admitting: Hematology

## 2017-11-17 VITALS — BP 107/65 | HR 84 | Temp 97.6°F | Resp 16

## 2017-11-17 DIAGNOSIS — Z95828 Presence of other vascular implants and grafts: Secondary | ICD-10-CM

## 2017-11-17 DIAGNOSIS — Z79899 Other long term (current) drug therapy: Secondary | ICD-10-CM | POA: Diagnosis not present

## 2017-11-17 DIAGNOSIS — F419 Anxiety disorder, unspecified: Secondary | ICD-10-CM | POA: Diagnosis not present

## 2017-11-17 DIAGNOSIS — Z5112 Encounter for antineoplastic immunotherapy: Secondary | ICD-10-CM | POA: Diagnosis not present

## 2017-11-17 DIAGNOSIS — G4700 Insomnia, unspecified: Secondary | ICD-10-CM | POA: Insufficient documentation

## 2017-11-17 DIAGNOSIS — Z79811 Long term (current) use of aromatase inhibitors: Secondary | ICD-10-CM | POA: Diagnosis not present

## 2017-11-17 DIAGNOSIS — C7951 Secondary malignant neoplasm of bone: Principal | ICD-10-CM

## 2017-11-17 DIAGNOSIS — C50911 Malignant neoplasm of unspecified site of right female breast: Secondary | ICD-10-CM

## 2017-11-17 DIAGNOSIS — Z170 Estrogen receptor positive status [ER+]: Secondary | ICD-10-CM | POA: Insufficient documentation

## 2017-11-17 DIAGNOSIS — Z923 Personal history of irradiation: Secondary | ICD-10-CM | POA: Diagnosis not present

## 2017-11-17 DIAGNOSIS — H905 Unspecified sensorineural hearing loss: Secondary | ICD-10-CM | POA: Diagnosis not present

## 2017-11-17 DIAGNOSIS — C799 Secondary malignant neoplasm of unspecified site: Secondary | ICD-10-CM

## 2017-11-17 DIAGNOSIS — C50919 Malignant neoplasm of unspecified site of unspecified female breast: Secondary | ICD-10-CM

## 2017-11-17 LAB — COMPREHENSIVE METABOLIC PANEL
ALBUMIN: 3.6 g/dL (ref 3.5–5.0)
ALK PHOS: 67 U/L (ref 38–126)
ALT: 16 U/L (ref 0–44)
ANION GAP: 12 (ref 5–15)
AST: 13 U/L — AB (ref 15–41)
BILIRUBIN TOTAL: 2.3 mg/dL — AB (ref 0.3–1.2)
BUN: 10 mg/dL (ref 6–20)
CALCIUM: 9.2 mg/dL (ref 8.9–10.3)
CO2: 24 mmol/L (ref 22–32)
Chloride: 105 mmol/L (ref 98–111)
Creatinine, Ser: 0.84 mg/dL (ref 0.44–1.00)
GFR calc Af Amer: >60 mL/min (ref >60)
GFR calc non Af Amer: >60 mL/min (ref >60)
GLUCOSE: 110 mg/dL — AB (ref 70–99)
Potassium: 3.5 mmol/L (ref 3.5–5.1)
SODIUM: 141 mmol/L (ref 135–145)
TOTAL PROTEIN: 6.8 g/dL (ref 6.5–8.1)

## 2017-11-17 LAB — CBC WITH DIFFERENTIAL/PLATELET
ABS IMMATURE GRANULOCYTES: 0.03 10*3/uL (ref 0.00–0.07)
Basophils Absolute: 0 10*3/uL (ref 0.0–0.1)
Basophils Relative: 0 %
EOS ABS: 0 10*3/uL (ref 0.0–0.5)
Eosinophils Relative: 1 %
HEMATOCRIT: 34.1 % — AB (ref 36.0–46.0)
Hemoglobin: 11.8 g/dL — ABNORMAL LOW (ref 12.0–15.0)
IMMATURE GRANULOCYTES: 1 %
LYMPHS ABS: 0.8 10*3/uL (ref 0.7–4.0)
Lymphocytes Relative: 23 %
MCH: 32 pg (ref 26.0–34.0)
MCHC: 34.6 g/dL (ref 30.0–36.0)
MCV: 92.4 fL (ref 80.0–100.0)
MONO ABS: 0.1 10*3/uL (ref 0.1–1.0)
MONOS PCT: 2 %
NEUTROS PCT: 73 %
Neutro Abs: 2.5 10*3/uL (ref 1.7–7.7)
Platelets: 202 10*3/uL (ref 150–400)
RBC: 3.69 MIL/uL — ABNORMAL LOW (ref 3.87–5.11)
RDW: 12.2 % (ref 11.5–15.5)
WBC: 3.5 10*3/uL — ABNORMAL LOW (ref 4.0–10.5)
nRBC: 0 % (ref 0.0–0.2)

## 2017-11-17 MED ORDER — SODIUM CHLORIDE 0.9 % IV SOLN
20.0000 mg | Freq: Once | INTRAVENOUS | Status: AC
  Administered 2017-11-17: 20 mg via INTRAVENOUS
  Filled 2017-11-17: qty 2

## 2017-11-17 MED ORDER — SODIUM CHLORIDE 0.9% FLUSH
10.0000 mL | INTRAVENOUS | Status: DC | PRN
  Administered 2017-11-17: 10 mL via INTRAVENOUS
  Filled 2017-11-17: qty 10

## 2017-11-17 MED ORDER — DEXAMETHASONE SODIUM PHOSPHATE 10 MG/ML IJ SOLN
10.0000 mg | Freq: Once | INTRAMUSCULAR | Status: AC
  Administered 2017-11-17: 10 mg via INTRAVENOUS

## 2017-11-17 MED ORDER — DEXAMETHASONE SODIUM PHOSPHATE 10 MG/ML IJ SOLN
INTRAMUSCULAR | Status: AC
  Filled 2017-11-17: qty 1

## 2017-11-17 MED ORDER — SODIUM CHLORIDE 0.9 % IV SOLN
80.0000 mg/m2 | Freq: Once | INTRAVENOUS | Status: AC
  Administered 2017-11-17: 132 mg via INTRAVENOUS
  Filled 2017-11-17: qty 22

## 2017-11-17 MED ORDER — SODIUM CHLORIDE 0.9 % IV SOLN
Freq: Once | INTRAVENOUS | Status: AC
  Administered 2017-11-17: 10:00:00 via INTRAVENOUS
  Filled 2017-11-17: qty 250

## 2017-11-17 MED ORDER — HEPARIN SOD (PORK) LOCK FLUSH 100 UNIT/ML IV SOLN
500.0000 [IU] | Freq: Once | INTRAVENOUS | Status: AC | PRN
  Administered 2017-11-17: 500 [IU]
  Filled 2017-11-17: qty 5

## 2017-11-17 MED ORDER — SODIUM CHLORIDE 0.9% FLUSH
10.0000 mL | INTRAVENOUS | Status: DC | PRN
  Administered 2017-11-17: 10 mL
  Filled 2017-11-17: qty 10

## 2017-11-17 MED ORDER — FAMOTIDINE IN NACL 20-0.9 MG/50ML-% IV SOLN: 20.0000 mg | Freq: Once | INTRAVENOUS | Status: DC

## 2017-11-17 NOTE — Patient Instructions (Signed)
[facility] Discharge Instructions for Patients Receiving Chemotherapy  Today you received the following chemotherapy agents :  Taxol.  To help prevent nausea and vomiting after your treatment, we encourage you to take your nausea medication as prescribed.   If you develop nausea and vomiting that is not controlled by your nausea medication, call the clinic.   BELOW ARE SYMPTOMS THAT SHOULD BE REPORTED IMMEDIATELY:  *FEVER GREATER THAN 100.5 F  *CHILLS WITH OR WITHOUT FEVER  NAUSEA AND VOMITING THAT IS NOT CONTROLLED WITH YOUR NAUSEA MEDICATION  *UNUSUAL SHORTNESS OF BREATH  *UNUSUAL BRUISING OR BLEEDING  TENDERNESS IN MOUTH AND THROAT WITH OR WITHOUT PRESENCE OF ULCERS  *URINARY PROBLEMS  *BOWEL PROBLEMS  UNUSUAL RASH Items with * indicate a potential emergency and should be followed up as soon as possible.  Feel free to call the clinic should you have any questions or concerns. The clinic phone number is [phone].  Please show the CHEMO ALERT CARD at check-in to the Emergency Department and triage nurse.   

## 2017-11-17 NOTE — Progress Notes (Signed)
Per Dr. Burr Medico, ok to tx with labs today.

## 2017-11-19 ENCOUNTER — Encounter: Payer: Self-pay | Admitting: *Deleted

## 2017-11-19 ENCOUNTER — Encounter: Payer: Self-pay | Admitting: Cardiology

## 2017-11-19 ENCOUNTER — Ambulatory Visit (INDEPENDENT_AMBULATORY_CARE_PROVIDER_SITE_OTHER): Payer: Medicaid Other | Admitting: Cardiology

## 2017-11-19 VITALS — BP 106/68 | HR 82 | Ht 67.0 in | Wt 128.0 lb

## 2017-11-19 DIAGNOSIS — R079 Chest pain, unspecified: Secondary | ICD-10-CM | POA: Diagnosis not present

## 2017-11-19 NOTE — Progress Notes (Signed)
Cardiology Office Note    Date:  11/26/2017   ID:  MUSKAN [name], DOB [birth year], MRN [id number]  PCP:  [name], [name]  Cardiologist:  Fransico Him, MD   Chief Complaint  Patient presents with  . Chest Pain    History of Present Illness:  [name] is a 52 y.o. female who is being seen today for the evaluation of chest pain at the request of Debra Merle, MD.  This is a very pleasant 52yo deaf female who is here today after being instructed to come to Cardiology for assessment although she says that she was never told the reason why.  All history is obtained through a sign language interpreter today.  She has a very unfortunate history of early differentiated right breast CA that was diagnosed in November 1999 at age 36.  At that time she had 1 out of 7 axillary nodes involved and was triple positive.  Underwent mastectomy with no dissection and then 4 cycles of chemotherapy with Adriamycin and Cytoxan followed by Taxotere.  She  had an excellent response to 6 cycles of taxotere herceptin perjeta from 07-30-15 thru 11-29-15. She is continuing herceptin and perjeta every 3 weeks. She had extreme local pain with first xgeva injection on 01-16-16 and prefers resuming previous zometa. She began letrozole ~ 01-16-16.  She has premature surgical menopause status post hysterectomy and BSO.  Her last 2D echocardiogram was done in July 2019 showing EF of 55% with a normal global longitudinal LV strain at -21.2.  Unfortunately she really does not know why she is here today but she says that she has been having some problems with chest pain recently.  She says it is sharp and stabbing and midsternal but with some radiation to her shoulder neck and left arm.  She does not really have any shortness of breath, diaphoresis or nausea with it.  She denies any PND, orthopnea, palpitations or lower extremity edema.  Past Medical History:  Diagnosis Date  . Abnormal Pap smear   . Anxiety     . Breast cancer ([place])   . Cancer ([place]) 1999   breast-s/p mastectomy, chemo, rad  . CIN I (cervical intraepithelial neoplasia I) 2003   by colpo  . Congenital deafness   . Deaf   . Depression   . Radiation 07/12/15-07/26/15   left femur 30 Gy  . S/P bilateral oophorectomy     Past Surgical History:  Procedure Laterality Date  . ABDOMINAL HYSTERECTOMY    . INTRAMEDULLARY (IM) NAIL INTERTROCHANTERIC Left 06/26/2015   Procedure: LEFT BIOMET LONG AFFIXS NAIL;  Surgeon: [name], MD;  Location: [facility];  Service: Orthopedics;  Laterality: Left;  . IR GENERIC HISTORICAL  08/06/2015   IR US GUIDE VASC ACCESS LEFT 08/06/2015 Aletta Edouard, MD [facility]INTER[facility]  . IR GENERIC HISTORICAL  08/06/2015   IR FLUORO GUIDE CV LINE LEFT 08/06/2015 Aletta Edouard, MD [facility]  . IR GENERIC HISTORICAL  03/05/2016   IR CV LINE INJECTION 03/05/2016 Markus Daft, MD [facility]  . MASTECTOMY     right breast  . MASTECTOMY    . OVARIAN CYST REMOVAL    . RADIOLOGY WITH ANESTHESIA Left 06/25/2015   Procedure: MRI OF LEFT HIP WITH OR WITHOUT CONTRAST;  Surgeon: Medication Radiologist, MD;  Location: [facility];  Service: Radiology;  Laterality: Left;  DR. MCINTYRE/MRI  . TUBAL LIGATION      Current Medications: Current Meds  Medication Sig  . ALPRAZolam (  XANAX) 0.25 MG tablet Take 1 tablet (0.25 mg total) by mouth 2 (two) times daily as needed for anxiety.  . divalproex (DEPAKOTE) 250 MG DR tablet Take 1 tablet (250 mg total) by mouth at bedtime.  . lidocaine-prilocaine (EMLA) cream Apply 1 application topically as needed. Apply to Porta-Cath 1-2 hours prior to access as directed.  . potassium chloride (K-DUR) 10 MEQ tablet Take 1 tablet (10 mEq total) by mouth 2 (two) times daily.  . [DISCONTINUED] HYDROcodone-acetaminophen (NORCO) 10-325 MG tablet Take 1 tablet by mouth every 6 (six) hours as needed.    Allergies:   Chlorhexidine; Promethazine hcl; and Diphenhydramine hcl   Social History   Socioeconomic  History  . Marital status: Married    Spouse name: Not on file  . Number of children: 2  . Years of education: Not on file  . Highest education level: Not on file  Occupational History  . Not on file  Social Needs  . Financial resource strain: Not on file  . Food insecurity:    Worry: Not on file    Inability: Not on file  . Transportation needs:    Medical: Not on file    Non-medical: Not on file  Tobacco Use  . Smoking status: Never Smoker  . Smokeless tobacco: Never Used  Substance and Sexual Activity  . Alcohol use: No  . Drug use: No  . Sexual activity: Yes    Birth control/protection: Surgical  Lifestyle  . Physical activity:    Days per week: Not on file    Minutes per session: Not on file  . Stress: Not on file  Relationships  . Social connections:    Talks on phone: Not on file    Gets together: Not on file    Attends religious service: Not on file    Active member of club or organization: Not on file    Attends meetings of clubs or organizations: Not on file    Relationship status: Not on file  Other Topics Concern  . Not on file  Social History Narrative  . Not on file     Family History:  The patient's family history includes Alzheimer's disease in her maternal uncle; Heart attack in her maternal grandfather; Hypertension in her mother; Pancreatitis in her maternal grandmother; Seizures in her mother.   ROS:   Please see the history of present illness.    ROS All other systems reviewed and are negative.  No flowsheet data found.     PHYSICAL EXAM:   VS:  BP 106/68   Pulse 82   Ht 5\' 7"  (1.702 m)   Wt 128 lb (58.1 kg)   SpO2 98%   BMI 20.05 kg/m    GEN: Well nourished, well developed, in no acute distress  HEENT: normal  Neck: no JVD, carotid bruits, or masses Cardiac: RRR; no murmurs, rubs, or gallops,no edema.  Intact distal pulses bilaterally.  Respiratory:  clear to auscultation bilaterally, normal work of breathing GI: soft,  nontender, nondistended, + BS MS: no deformity or atrophy  Skin: warm and dry, no rash Neuro:  Alert and Oriented x 3, Strength and sensation are intact Psych: euthymic mood, full affect  Wt Readings from Last 3 Encounters:  11/19/17 128 lb (58.1 kg)  11/10/17 127 lb (57.6 kg)  10/27/17 127 lb 11.2 oz (57.9 kg)      Studies/Labs Reviewed:   EKG:  EKG is not ordered today.    Recent Labs: 11/24/2017: ALT 19;  BUN 11; Creatinine, Ser 0.88; Hemoglobin 10.9; Platelets 217; Potassium 3.9; Sodium 139   Lipid Panel No results found for: CHOL, TRIG, HDL, CHOLHDL, VLDL, LDLCALC, LDLDIRECT  Additional studies/ records that were reviewed today include:  Office noted from PCP    ASSESSMENT:    1. Chest pain, unspecified type      PLAN:  In order of problems listed above:  1. Chest pain -Her pain is atypical and is sharp and stabbing in nature.  She had a 2D echocardiogram done back in log which showed normal LV function and no regional wall motion abnormalities with normal strain pattern. -She really has no cardiac risk factors except for premature ovarian failure after hysterectomy and BSO.  She has never smoked.  She has a family history in grandparents who have had CAD. -I recommended a stress echo to assess LV function further and rule out ischemia.  This will also help to rule out pericardial effusion as well.    Medication Adjustments/Labs and Tests Ordered: Current medicines are reviewed at length with the patient today.  Concerns regarding medicines are outlined above.  Medication changes, Labs and Tests ordered today are listed in the Patient Instructions below.  Patient Instructions  Medication Instructions:  Your physician recommends that you continue on your current medications as directed. Please refer to the Current Medication list given to you today.  If you need a refill on your cardiac medications before your next appointment, please call your pharmacy.   Lab  work: None ordered  If you have labs (blood work) drawn today and your tests are completely normal, you will receive your results only by: [facility] MyChart Message (if you have MyChart) OR . A paper copy in the mail If you have any lab test that is abnormal or we need to change your treatment, we will call you to review the results.  Testing/Procedures: Your physician has requested that you have a stress echocardiogram. For further information please visit [email or web address]. Please follow instruction sheet as given.    Follow-Up: Your physician recommends that you schedule a follow-up appointment in: AS NEEDED  Any Other Special Instructions Will Be Listed Below (If Applicable).  Exercise Stress Echocardiogram, Care After This sheet gives you information about how to care for yourself after your procedure. Your doctor may also give you more specific instructions. If you have problems or questions, call your doctor. Follow these instructions at home:  You may do these things as told by your doctor: ? Eat what you normally eat. ? Do your normal activities. ? Take your normal medicines.  Take over-the-counter and prescription medicines only as told by your doctor.  Keep all follow-up visits as told by your doctor. This is important. Contact a doctor if:  You keep feeling dizzy or light-headed.  You feel like your heart is beating fast.  You keep feeling sick to your stomach (nauseous) or you throw up (vomit).  You have a headache.  You feel short of breath. Get help right away if:  You have pain or pressure in any of these areas: ? Your chest. ? Your jaw or neck. ? Between your shoulder blades. ? Down your left arm.  You pass out (faint).  You have trouble breathing. Summary  After your procedure, you may eat like normal, do your normal activities, and take your normal medicines as told by your doctor.  Contact your doctor if you have dizziness, a fast heartbeat, or a  headache.  You should also  contact your doctor if you feel sick to your stomach (nauseous), you throw up (vomit), or you feel short of breath.  Get help right away if you feel pain or pressure in any of these areas: your chest, jaw, neck, between your shoulder blades, or down your right arm.  You should also get help right away if you pass out (faint) or have trouble breathing. This information is not intended to replace advice given to you by your health care provider. Make sure you discuss any questions you have with your health care provider. Document Released: 10/19/2012 Document Revised: 09/23/2015 Document Reviewed: 09/23/2015 Elsevier Interactive Patient Education  2017 [place], Fransico Him, MD  11/26/2017 2:07 PM    [address], [place], [place]  [address] Phone: [phone]; Fax: [phone]

## 2017-11-19 NOTE — Patient Instructions (Addendum)
Medication Instructions:  Your physician recommends that you continue on your current medications as directed. Please refer to the Current Medication list given to you today.  If you need a refill on your cardiac medications before your next appointment, please call your pharmacy.   Lab work: None ordered  If you have labs (blood work) drawn today and your tests are completely normal, you will receive your results only by: [facility] MyChart Message (if you have MyChart) OR . A paper copy in the mail If you have any lab test that is abnormal or we need to change your treatment, we will call you to review the results.  Testing/Procedures: Your physician has requested that you have a stress echocardiogram. For further information please visit [email or web address]. Please follow instruction sheet as given.    Follow-Up: Your physician recommends that you schedule a follow-up appointment in: AS NEEDED  Any Other Special Instructions Will Be Listed Below (If Applicable).  Exercise Stress Echocardiogram, Care After This sheet gives you information about how to care for yourself after your procedure. Your doctor may also give you more specific instructions. If you have problems or questions, call your doctor. Follow these instructions at home:  You may do these things as told by your doctor: ? Eat what you normally eat. ? Do your normal activities. ? Take your normal medicines.  Take over-the-counter and prescription medicines only as told by your doctor.  Keep all follow-up visits as told by your doctor. This is important. Contact a doctor if:  You keep feeling dizzy or light-headed.  You feel like your heart is beating fast.  You keep feeling sick to your stomach (nauseous) or you throw up (vomit).  You have a headache.  You feel short of breath. Get help right away if:  You have pain or pressure in any of these areas: ? Your chest. ? Your jaw or neck. ? Between your shoulder  blades. ? Down your left arm.  You pass out (faint).  You have trouble breathing. Summary  After your procedure, you may eat like normal, do your normal activities, and take your normal medicines as told by your doctor.  Contact your doctor if you have dizziness, a fast heartbeat, or a headache.  You should also contact your doctor if you feel sick to your stomach (nauseous), you throw up (vomit), or you feel short of breath.  Get help right away if you feel pain or pressure in any of these areas: your chest, jaw, neck, between your shoulder blades, or down your right arm.  You should also get help right away if you pass out (faint) or have trouble breathing. This information is not intended to replace advice given to you by your health care provider. Make sure you discuss any questions you have with your health care provider. Document Released: 10/19/2012 Document Revised: 09/23/2015 Document Reviewed: 09/23/2015 [employer] Interactive Patient Education  2017 [employer].

## 2017-11-23 NOTE — Progress Notes (Signed)
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       [name], right ([place])   11/1997 Cancer Diagnosis    Patient had 1.4 cm poorly differentiated right breast carcinoma diagnosed in Nov 1999 at age 40, 1/7 axillary nodes involved, ER/PR and HER 2 positive. She had mastectomy with the 7 axillary node evaluation, 4 cycles of adriamycin/ cytoxan followed by taxotere, then five years of tamoxifen thru June 2005 (no herceptin in 1999). She had bilateral oophorectomy in May 2005. She was briefly on aromatase inhibitor after tamoxifen, but discontinued this herself due to poor tolerance.    04/04/2015 Genetic Testing    Negative genetic testing on the breast/ovarian cancer pnael.  The Breast/Ovarian gene panel offered by [facility] includes sequencing and rearrangement analysis for the following 20 genes:  ATM, BARD1, BRCA1, BRCA2, BRIP1, CDH1, CHEK2, EPCAM, FANCC, MLH1, MSH2, MSH6, NBN, PALB2, PMS2, PTEN, RAD51C, RAD51D, TP53, and XRCC2.    06/26/2015 Pathology Results    Initial Path Report Bone, curettage, Left femur met, intramedullary subtroch tissue - METASTATIC ADENOCARCINOMA.  Estrogen Receptor: 95%, POSITIVE, STRONG STAINING INTENSITY Progesterone Receptor: 2%, POSITIVE, STRONG  HER2 (+), IHC 3+    06/26/2015 Surgery    Biopsy of metastatic tissue and stabilization with Affixus trochanteric nail, proximal and distal interlock of left femur by Dr. [name]     06/28/2015 Imaging    CT Chest w/ Contrast 1. Extensive right internal mammary chain  lymphadenopathy consistent with metastatic breast cancer. 2. Lytic metastases involving the posterior elements at T1 with probable nondisplaced pathologic fracture of the spinous process. 3. No other evidence of thoracic metastatic disease. 4. Trace bilateral pleural effusions with associated bibasilar atelectasis. 5. Indeterminate right thyroid nodule, not previously imaged. This could be evaluated with thyroid ultrasound as clinically warranted.    07/01/2015 Imaging    Bone Scan 1. Increased activity noted throughout the left femur. Although metastatic disease cannot be excluded, these changes are most likely secondary to prior surgery. 2. No other focal abnormalities identified to suggest metastatic disease.    07/12/2015 - 07/26/2015 Radiation Therapy    Left femur, 30 Gy in 10 fractions by Dr. Sondra Come     07/14/2015 Initial Diagnosis    [name], right ([place])    07/19/2015 Imaging    Initial PET Scan 1. Severe multifocal osseous metastatic disease, much greater than anticipated based on prior imaging. 2. Multifocal nodal metastases involving the right internal mammary, prevascular and subpectoral lymph nodes. No axillary pulmonary involvement identified. 3. No distant extra osseous metastases.    07/30/2015 - 11/29/2015 Chemotherapy    Docetaxel, Herceptin and Perjeta every 3 weeks X6 cycles, pt tolerated moderately well, restaging scan showed excellent response     09/18/2015 Imaging    CT Head w/wo Contrast 1. No acute intracranial abnormality or significant interval change. 2. Stable minimal periventricular white matter hypoattenuation on the right. This may reflect a remote ischemic injury. 3. No evidence for metastatic disease to the brain. 4. No focal soft tissue lesion to explain the patient's  tenderness.    10/30/2015 Imaging    CT Abdomen Pelvis w/ Contrast 1. Few mildly prominent fluid-filled loops of small bowel scattered throughout the abdomen,  with intraluminal fluid density within the distal colon. Given the provided history, findings are suggestive of acute enteritis/diarrheal illness. 2. No other acute intra-abdominal or pelvic process identified. 3. Widespread osseous metastatic disease, better evaluated on most recent PET-CT from 07/19/2015. No pathologic fracture or other complication. 4. 11 mm hypodensity within the left kidney. This lesion measures intermediate density, and is indeterminate. While this lesion is similar in size relative to recent studies, this is increased in size relative to prior study from 2012. Further evaluation with dedicated renal mass protocol CT and/or MRI is recommended for complete characterization.    12/2015 - 05/14/2016 Chemotherapy    Maintenance Herceptin and pejeta every 3 weeks stopped on 05/14/16 due to disease progression    12/25/2015 Imaging    Restaging PET Scan 1. Markedly improved skeletal activity, with only several faint foci of residual accentuated metabolic activity, but resolution of the vast majority of the previously extensive osseous metastatic disease. 2. Resolution of the prior right internal mammary and right prevascular lymph nodes. 3. Residual subcutaneous edema and edema along fascia planes in the left upper thigh. This remains somewhat more than I would expect for placement of an IM nail 6 weeks ago. If there is leg swelling further down, consider Doppler venous ultrasound to rule out left lower extremity DVT. I do not perceive an obvious difference in density between the pelvic and common femoral veins on the noncontrast CT data. 4. Chronic right maxillary and right sphenoid sinusitis.    01/16/2016 -  Anti-estrogen oral therapy    Letrozole 2.5 mg daily. She was switched to exemestane due to disease progression on 02/08/17.    03/01/2016 Miscellaneous    Patient presented to ED following a fall; she reports she landed on her back and is now experiencing back pain. The patient  was evaluated and discharge home same day with pain control.    05/01/2016 Imaging    CT CAP IMPRESSION: 1. Stable exam.  No new or progressive disease identified. 2. No evidence for residual or recurrent adenopathy within the chest. 3. Stable bone metastasis.    05/01/2016 Imaging    BONE SCAN IMPRESSION: 1. Small focal uptake involving the anterior rib ends of the left second and right fourth ribs, new since the prior bone scan. The location of this uptake is more suggestive of a traumatic or inflammatory etiology as opposed to metastatic disease. No other evidence of metastatic disease. 2. There are other areas of stable uptake which are most likely degenerative/reactive, including right shoulder and cervical spine uptake and uptake along the right femur adjacent to the ORIF hardware.    05/20/2016 Imaging    NM PET Skull to Thigh IMPRESSION: 1. There multiple metastatic foci in the bony pelvis, including some new abnormal foci compare to the prior PET-CT, compatible with mildly progressive bony metastatic disease. Also, a left T11 vertebral hypermetabolic metastatic lesion is observed, new compared to the prior exam. 2. No active extraosseous malignancy is currently identified. 3. Right anterior fourth rib healing fracture, appears be    06/04/2016 - 01/08/2017 Chemotherapy    Second line chemotherapy Kadcyla every 3 weeks started on 06/04/16 and stopped on 01/08/17 due to mixed response.      10/05/2016 Imaging    CT CAP and Bone Scan:  Bones: left intramedullary rod and left femoral  head neck screw in place. In the proximal diaphysis of the left femur there is cortical thickening and irregularity. No lytic or blastic bone lesions. No fracture or vertebral endplate destruction.   No definite evidence of recurrent disease in the chest, abdomen, or pelvis.    10/28/2016 Imaging    CT A/P IMPRESSION: No acute process demonstrated in the abdomen or pelvis. No evidence of  bowel obstruction or inflammation.    11/04/2016 Imaging    DG foot complete left: IMPRESSION: No fracture or dislocation.  No soft tissue abnormality    11/23/2016 Imaging    CT RIGHT FEMUR IMPRESSION: Negative CT scan of the right thigh.  No visible metastatic disease.  CT LEFT FEMUR IMPRESSION: 1. Postsurgical changes related to prior cephalomedullary rod fixation of the left femur with linear lucency along the anterolateral proximal femoral cortex at level of the lesser trochanter, suspicious for nondisplaced fracture. 2. Slightly permeative appearance of the proximal left femoral cortex at the site of known prior osseous metastases is largely unchanged. 3. Unchanged patchy lucency and sclerosis along the anterior acetabulum, which appears to correspond with an area of faint uptake on the prior PET-CT, suspicious for osseous metastasis. These results will be called to the ordering clinician or representative by the Radiologist Assistant, and communication documented in the PACS or zVision Dashboard.      01/27/2017 Imaging    IMPRESSION: 1. Mixed response to osseous metastasis. Some hypermetabolic lesions are new and increasingly hypermetabolic, while another index lesion is decreasingly hypermetabolic. 2. No evidence of hypermetabolic soft tissue metastasis.     02/08/2017 -  Antibody Plan    -Exemestane 25 mg daily since 02/08/17 -Herceptin every 3 week since 02/08/17 -Oral Lanpantinib 1568m and decreased to 5067mdue to diarrhea and depression. Due to disease progression she swithced to Verzenio 1504mn 05/04/17. She did not toelrate well so she was switched to Ibrance 100m40m 05/31/17. Due to her neutropenia, Ibrance dose reduced to 75 mg daily, 3 weeks on, one-week off.     04/30/2017 PET scan    IMPRESSION: 1. Primarily increase in hypermetabolism of osseous metastasis. An isolated right acetabular lesion is decreased in hypermetabolism since the prior exam. 2.  No evidence of hypermetabolic soft tissue metastasis. 3. Similar non FDG avid right-sided thyroid nodule, favoring a benign etiology. Recommend attention on follow-up.    08/30/2017 PET scan    08/30/2017 PET Scan  IMPRESSION: 1. Worsening osseous metastatic disease. 2. Focal hypermetabolism in the central spinal canal the L1 level, similar to the prior exam. Metastatic implant cannot be excluded. 3. Slight enlargement of a right thyroid nodule which now appears more cystic in character. Consider further evaluation with thyroid ultrasound. If patient is clinically hyperthyroid, consider nuclear medicine thyroid uptake and scan    08/30/2017 Progression    08/30/2017 PET Scan  IMPRESSION: 1. Worsening osseous metastatic disease. 2. Focal hypermetabolism in the central spinal canal the L1 level, similar to the prior exam. Metastatic implant cannot be excluded. 3. Slight enlargement of a right thyroid nodule which now appears more cystic in character. Consider further evaluation with thyroid ultrasound. If patient is clinically hyperthyroid, consider nuclear medicine thyroid uptake and scan    09/29/2017 - 10/11/2017 Radiation Therapy    With Dr, KinaSondra Comerted 09/29/2017 Finished 10/11/2017    10/19/2017 -  Chemotherapy    -Exemestane 25mg51mly starting 02/08/17, stopped on 10/20/2017 when she started chemo  -Herceptin every 3 weeks starting 02/08/17, perjeta added on 10/20/2017  -  weekly Taxol started on 10/21/2017     10/27/2017 Imaging    10/27/2017 CT AP IMPRESSION: 1. No acute abnormality. No evidence of bowel obstruction or acute bowel inflammation. Normal appendix. 2. Stable patchy sclerotic osseous metastases throughout the visualized skeleton. No new or progressive metastatic disease in the abdomen or pelvis.     HISTORY OF PRESENTING ILLNESS (From Dr. Mariana Kaufman note on 02/06/2016):  [name] 52 y.o. 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 She has had an excellent response to 6 cycles of taxotere herceptin perjeta from 07-30-15 thru 11-29-15. She is continuing herceptin and perjeta every 3 weeks. She had extreme local pain with first xgeva injection on 01-16-16 and prefers resuming previous zometa. She began letrozole ~ 01-16-16, has no complaints about this at all.  Last imaging was PET 12-25-15.  NOTE CTs, plain Xrays and bone scan did NOT show any of the extensively metastatic bone involvement 06-2015; MRI and PET did image disease clearly.  Patient continues to do very well overall. MRSA skin lesions on scalp and face improved quickly with doxycycline starting 01-16-16. She continues to use Dial soap and we have again strongly encouraged good hygiene with other family members at home. She has had the 3 lymphedema PT sessions allowed by insurance, continues to do massage for LLE herself, still has not gotten the ordered compression apparatus for home and will follow up with PT on that. Swelling in LLE has improved with the interventions and does not seem as uncomfortable. She denies any pain. Appetite is excellent, bowels fine, no SOB, good energy, no problems with PAC, no noted changes in left breast. No bleeding. No fever or symptoms of infection. She slipped in tub last week, fell onto right arm fortunately without injury. She was upset by a visitor and shaky then, but no tremors otherwise.   She has two children and 4 grandchildren. She has had genetic testing and it was negative. She lives at home with her husband. She is very active. Her children live close to her.   CURRENT THERAPY:    -Exemestane 11m daily starting 02/08/17, stopped on 10/20/2017 when she started chemo  -Herceptin every 3 weeks starting 02/08/17, perjeta added on 10/20/2017  -weekly Taxol started on 10/21/2017, 3 weeks on and one week off   INTERIM HISTORY:   WFelisa Bonier89y.o.  female returns today for follow up. She has followed up with Cardiologist Dr. TRadford Paxin the interim and is scheduled for a stress echo next week.   Today, she is here with her husband and interpreter. She is doing well, but complains of decreased urine output. She also has had a few episodes of difficulty breathing, for which she was advised to have a stress echo. She noticed hair loss and fatigue.  She states that her bone pain is improving but she is starting to lose her balance.She also noticed metal taste and hair thinning.    MEDICAL HISTORY:  Past Medical History:  Diagnosis Date   Abnormal Pap smear    Anxiety    [place] cancer [place]    [place]   breast-s/p mastectomy, chemo, rad   CIN I (cervical intraepithelial neoplasia I) 2003   by colpo   Congenital deafness    Deaf    Depression    Radiation 07/12/15-07/26/15   left femur 30 Gy   S/P bilateral oophorectomy  SURGICAL HISTORY: Past Surgical History:  Procedure Laterality Date   ABDOMINAL HYSTERECTOMY     INTRAMEDULLARY (IM) NAIL INTERTROCHANTERIC Left 06/26/2015   Procedure: LEFT BIOMET LONG AFFIXS NAIL;  Surgeon: [name], MD;  Location: [facility];  Service: Orthopedics;  Laterality: Left;   IR GENERIC HISTORICAL  08/06/2015   IR US GUIDE VASC ACCESS LEFT 08/06/2015 Aletta Edouard, MD [facility]   IR GENERIC HISTORICAL  08/06/2015   IR FLUORO GUIDE CV LINE LEFT 08/06/2015 Aletta Edouard, MD [facility]   IR GENERIC HISTORICAL  03/05/2016   IR CV LINE INJECTION 03/05/2016 Markus Daft, MD [facility]   MASTECTOMY     right breast   MASTECTOMY     OVARIAN CYST REMOVAL     RADIOLOGY WITH ANESTHESIA Left 06/25/2015   Procedure: MRI OF LEFT HIP WITH OR WITHOUT CONTRAST;  Surgeon: Medication Radiologist, MD;  Location: [facility];  Service: Radiology;  Laterality: Left;  DR. MCINTYRE/MRI   TUBAL LIGATION      SOCIAL HISTORY: Social History   Socioeconomic History   Marital status: Married      Spouse name: Not on file   Number of children: 2   Years of education: Not on file   Highest education level: Not on file  Occupational History   Not on file  Social Needs   Financial resource strain: Not on file   Food insecurity:    Worry: Not on file    Inability: Not on file   Transportation needs:    Medical: Not on file    Non-medical: Not on file  Tobacco Use   Smoking status: Never Smoker   Smokeless tobacco: Never Used  Substance and Sexual Activity   Alcohol use: No   Drug use: No   Sexual activity: Yes    Birth control/protection: Surgical  Lifestyle   Physical activity:    Days per week: Not on file    Minutes per session: Not on file   Stress: Not on file  Relationships   Social connections:    Talks on phone: Not on file    Gets together: Not on file    Attends religious service: Not on file    Active member of club or organization: Not on file    Attends meetings of clubs or organizations: Not on file    Relationship status: Not on file   Intimate partner violence:    Fear of current or ex partner: Not on file    Emotionally abused: Not on file    Physically abused: Not on file    Forced sexual activity: Not on file  Other Topics Concern   Not on file  Social History Narrative   Not on file    FAMILY HISTORY: Family History  Problem Relation Age of Onset   Hypertension Mother    Seizures Mother    Alzheimer's disease Maternal Uncle    Pancreatitis Maternal Grandmother    Heart attack Maternal Grandfather     ALLERGIES:  is allergic to chlorhexidine; promethazine hcl; and diphenhydramine hcl.  MEDICATIONS:  Current Outpatient Medications  Medication Sig Dispense Refill   ALPRAZolam (XANAX) 0.25 MG tablet Take 1 tablet (0.25 mg total) by mouth 2 (two) times daily as needed for anxiety. 30 tablet 0   divalproex (DEPAKOTE) 250 MG DR tablet Take 1 tablet (250 mg total) by mouth at bedtime. 30 tablet 2    HYDROcodone-acetaminophen (NORCO) 10-325 MG tablet Take 1 tablet by mouth every 6 (six)  hours as needed. 45 tablet 0   lidocaine-prilocaine (EMLA) cream Apply 1 application topically as needed. Apply to Porta-Cath 1-2 hours prior to access as directed. 30 g 2   potassium chloride (K-DUR) 10 MEQ tablet Take 1 tablet (10 mEq total) by mouth 2 (two) times daily. 60 tablet 0   No current facility-administered medications for this visit.    Facility-Administered Medications Ordered in Other Visits  Medication Dose Route Frequency Provider Last Rate Last Dose   sodium chloride 0.9 % 1,000 mL with potassium chloride 10 mEq infusion   Intravenous Continuous Livesay, Tamala Julian, MD   Stopped at 09/10/15 1653   sodium chloride 0.9 % injection 10 mL  10 mL Intravenous PRN Livesay, Lennis P, MD        REVIEW OF SYSTEMS:  Constitutional: Denies fevers, chills or abnormal night sweats (+) fatigue (+) hair thinning Eyes: Denies blurriness of vision, double vision or watery eyes Ears, nose, mouth, throat, and face: Denies mucositis or sore throat (+) deaf (+) metal taste Respiratory: Denies cough or wheezes (+) dyspnea Cardiovascular: Denies palpitation, chest discomfort or lower extremity swelling Skin: No rashes or discolorations  Gastrointestinal:  No nausea (+) mild diarrhea GU: (+) weak urine stream   Lymphatics: Denies new lymphadenopathy or easy bruising. MSK: No new joint or bone pain Neuro: (+) balance difficulties  Behavioral/Psych: No new changes  All other systems were reviewed with the patient and are negative.  PHYSICAL EXAMINATION:  ECOG PERFORMANCE STATUS: 1 Vitals: BP 106/70 Pulse 87 RR 16 GENERAL:alert, no distress and comfortable  EYES: normal, conjunctiva are pink and non-injected, sclera clear OROPHARYNX:no exudate, no erythema and lips, buccal mucosa, and tongue normal  NECK: supple, thyroid normal size, non-tender, without nodularity LYMPH:  no palpable lymphadenopathy in  the cervical, axillary or inguinal LUNGS: clear to auscultation and percussion with normal breathing effort HEART: regular rate & rhythm and no murmurs and no lower extremity edema ABDOMEN:abdomen soft, No organomegaly.  Musculoskeletal:no cyanosis of digits and no clubbing  PSYCH: alert & oriented x 3 with fluent speech NEURO: no focal motor/sensory deficits   LABORATORY DATA:  I have reviewed the data as listed CBC Latest Ref Rng & Units 11/24/2017 11/17/2017 11/10/2017  WBC 4.0 - 10.5 K/uL 1.9(L) 3.5(L) 3.5(L)  Hemoglobin 12.0 - 15.0 g/dL 10.9(L) 11.8(L) 10.8(L)  Hematocrit 36.0 - 46.0 % 32.2(L) 34.1(L) 31.5(L)  Platelets 150 - 400 K/uL 217 202 166   CMP Latest Ref Rng & Units 11/24/2017 11/17/2017 11/10/2017  Glucose 70 - 99 mg/dL 88 110(H) 87  BUN 6 - 20 mg/dL 11 10 9   Creatinine 0.44 - 1.00 mg/dL 0.88 0.84 0.83  Sodium 135 - 145 mmol/L 139 141 143  Potassium 3.5 - 5.1 mmol/L 3.9 3.5 3.3(L)  Chloride 98 - 111 mmol/L 106 105 107  CO2 22 - 32 mmol/L 24 24 25   Calcium 8.9 - 10.3 mg/dL 9.3 9.2 9.0  Total Protein 6.5 - 8.1 g/dL 6.5 6.8 6.4(L)  Total Bilirubin 0.3 - 1.2 mg/dL 2.0(H) 2.3(H) 1.9(H)  Alkaline Phos 38 - 126 U/L 64 67 67  AST 15 - 41 U/L 14(L) 13(L) 14(L)  ALT 0 - 44 U/L 19 16 18     TUMOR MARKER  CA 27.29 10/08/17: 62.0   PROCEDURES: 08/04/2017 ECHO Study Conclusions  - Left ventricle: Inferobasal hypokinesis. The cavity size was   normal. The estimated ejection fraction was 55%. Wall motion was   normal; there were no regional wall motion abnormalities. Left  ventricular diastolic function parameters were normal. - Mitral valve: Calcified annulus. Mildly thickened leaflets . - Atrial septum: No defect or patent foramen ovale was identified. - Impressions: Normal GLS-21.2  RADIOGRAPHIC STUDIES:  10/27/2017 CT AP IMPRESSION: 1. No acute abnormality. No evidence of bowel obstruction or acute bowel inflammation. Normal appendix. 2. Stable patchy sclerotic  osseous metastases throughout the visualized skeleton. No new or progressive metastatic disease in the abdomen or pelvis.   08/30/2017 PET Scan  IMPRESSION: 1. Worsening osseous metastatic disease. 2. Focal hypermetabolism in the central spinal canal the L1 level, similar to the prior exam. Metastatic implant cannot be excluded. 3. Slight enlargement of a right thyroid nodule which now appears more cystic in character. Consider further evaluation with thyroid ultrasound. If patient is clinically hyperthyroid, consider nuclear medicine thyroid uptake and scan.  PET Scan 04/30/17 IMPRESSION: 1. Primarily increase in hypermetabolism of osseous metastasis. An isolated right acetabular lesion is decreased in hypermetabolism since the prior exam. 2. No evidence of hypermetabolic soft tissue metastasis. 3. Similar non FDG avid right-sided thyroid nodule, favoring a benign etiology. Recommend attention on follow-up.  PET SCAN: 01/27/17 IMPRESSION: 1. Mixed response to osseous metastasis. Some hypermetabolic lesions are new and increasingly hypermetabolic, while another index lesion is decreasingly hypermetabolic. 2. No evidence of hypermetabolic soft tissue metastasis.  CT A/P IMPRESSION: 10/28/16 No acute process demonstrated in the abdomen or pelvis. No evidence of bowel obstruction or inflammation.  CT CAP and Bone Scan: 10/05/16  Bones: left intramedullary rod and left femoral head neck screw in place. In the proximal diaphysis of the left femur there is cortical thickening and irregularity. No lytic or blastic bone lesions. No fracture or vertebral endplate destruction.  No definite evidence of recurrent disease in the chest, abdomen, or pelvis.  PET 05/20/2016 IMPRESSION: 1. There multiple metastatic foci in the bony pelvis, including some new abnormal foci compare to the prior PET-CT, compatible with mildly progressive bony metastatic disease. Also, a left T11 vertebral hypermetabolic  metastatic lesion is observed, new compared to the prior exam. 2. No active extraosseous malignancy is currently identified. 3. Right anterior fourth rib healing fracture, appears benign.  NM Bone Scan 05/01/2016 IMPRESSION: 1. Small focal uptake involving the anterior rib ends of the left second and right fourth ribs, new since the prior bone scan. The location of this uptake is more suggestive of a traumatic or inflammatory etiology as opposed to metastatic disease. No other evidence of metastatic disease. 2. There are other areas of stable uptake which are most likely degenerative/reactive, including right shoulder and cervical spine uptake and uptake along the right femur adjacent to the ORIF Hardware.  CT CAP 05/01/2016 IMPRESSION: 1. Stable exam.  No new or progressive disease identified. 2. No evidence for residual or recurrent adenopathy within the chest. 3. Stable bone metastasis.  I have personally reviewed the radiological images as listed and agreed with the findings in the report.  ASSESSMENT & PLAN:  Mrs. Sancho is a 52 y.o. postmenopause female with:  1. 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 -Restaging PET 12-25-15 markedly improved of bone metastasis after initial systemic chemo  -she was switched to letrozole and maintenance Herceptin and the perjeta on 01/16/16 which stopped on 05/14/16.  -Did not tolerate SQ xgeva and it was changed to zometa in 06/2015 -Continue monitoring ECHO every 3 months when she is on  herceptin -I previously reviewed her CT scan and bone scan from 05/01/2016, which showed stable disease overall, a few new uptake of anterior ribs on bone scan are indeterminate. -I previously discussed her restaging PET scan from 05/20/2016. Unfortunately it showed mild disease progression of her bone metastasis  -I  have changed her treatment to second line Kadcyla and letrozole on 06/04/16, she is tolerating very well. She declined other chemo.  -We previously received records from [facility] in [place], [place]. She underwent CT CAP w contrast on 10/02/16 and bone scan on 09/21/16. CT shows no evidence of recurrent disease in the chest, abdomen, or pelvis; Bone scan indicates no lytic or blastic bone lesions.  -She has been on Zometa every 4-6 weeks since June 2017. We previously changed to every 3 months in 12/2016. 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  No other visceral organ metastasis. She is clinically doing well.  -Kadcyla was switched to Exemestane, oral HER2 antibody treatment lapatinib and herceptin starting 02/08/17. -She tried lapatinib, could not tolerate and stopped. -Her PET Scan from 04/30/17 revealed a primarily increase in hypermetabolism of osseous metastasis. There are no new lesions.  I reviewed the images and compared to the previous images in person with patient and her husband. I suspect her progression is due to her not being able to fully tolerate lapatinib -I previously discussed options with clinical trial for the new Her2 antibody conjugate Trastuzumab deruxtecan, the phase 3 clinical trial has been open, and the closest site is in [place] and [employer]. She is not interested in going to [place] at this time. -She did not respond to CDK4/6 inhibitor -Her 08/30/2017 PET Scan showed disease progression in her bones, no other new visceral metastasis. -Will stop Ibrance due to disease progression, and started her on Neratinib -She tolerated neratinib very poorly, with diarrhea, nausea, vomiting, taste change and poor appetite. I stopped it  previously. --Due to her disease progression, I previously recommend her to start chemotherapy again with weekly Taxol, along with Herceptin and pejeta every 3 weeks started on 10/20/2017 -she has been tolerating chemo moderately well overall, with main side effect of fatigue, slightly decreased appetite and taste change.  Her pain is overall stable. -Labs reviewed, CBC showed Hg 10.9 WBC 1.9 with ANC 1.0K . CMP showed K 3.9.  Adequate for treatment, will proceed Taxol today and chemo break next week -continue Herceptin, Perjeta every 3 weeks, next due in a week  -If her ANC drops again, will use Granix as needed -f/u in 2 weeks with our NP -plan to restage with PET in early Jan   2. History of MRSA skin infection scalp, boil at low back scalp and upper lip -I prescribed doxycycline on 04/24/16 for 7 days  -Resolved now.  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  -Husband also deaf.  4. premature surgical menopause -post BSO 2068fr pelvic pain and ovarian cysts, likely was also beneficial from standpoint of the breast cancer. -hysterectomy   5. Intermittent anemia  -mild, possible related to prior chemo -She is asymptomatic, we'll continue close monitoring  6. Hypokalemia  -She has had intermittent hypokalemia --She is only taking oral potassium 10 mEq twice daily daily, will continue  -CMP showed K 3.9  7.  Goal of care discussion  -We previously discussed the incurable nature of her cancer, and the overall poor prognosis, especially if she does not have good response to chemotherapy or progress on chemo.  -The patient understands the goal of care is palliative. -she is full code for now   8. Bilateral Leg Pain -She initially presented with worsening left leg and hip pain secondary to bone mets, she had surgery before, this was much improved after her prior chemo -The patient previously took [place] for this pain, 1 tablet every 6 hours as needed for  pain. Which helped. -I previously encouraged her to follow up with Dr. [name] about the pain in her left leg. -She requests to have bed for all infusions  -She has had bilateral thigh pain, possible related to her pelvic bone metastasis. She also previously complained of RLQ abdominal pain. A CT scan was done to r/u appendicitis and results were negative. Therefore, her RLQ was most likely also related to bone metastasis.  -Her pain has much improved after palliative radiation lately.  9. Insomnia, Depression, Stress   -I previously gave her Ambien 65m, but it did not work well.  -continue ativan 1 mg to take at night as needed  -mirtazapine did not work, she also tried trazodone from PCP which gave her a rash. She stopped taking it.  -After the death of her dog she has been stressed and sad from how it happened.  -I previously offered her the chance to speak with our counselor or a therapist, she declined at this time. She denied inclinations of self harm.  -I also previously offered her the choice of antidepressants. She will try coping without medication for now. I previously encouraged her to speak with family or friends about her feelings.  -she started xanax in late Oct for worsening anxiety, better now   10.  Hyperbilirubinemia  -She has developed mild hyperbilirubinemia recently, TBIL 1.2-2.7, okay to continue treatment  -bilirubin 2 today     Plan:  -Lab reviewed, adequate for treatment, will proceed Taxol today,and chemo break next week -Perjeta and Herceptin next week  -f/u in 2 weeks with an NP, see me in 3 weeks  -I will refill norco today   Pt had many questions. All questions were answered. The patient knows to call the clinic with any problems, questions or concerns.  I spent 20 minutes counseling the patient face to face. The total time spent in the appointment was 25 minutes and more than 50% was on counseling.  IDierdre SearlesDweik am acting as scribe for Dr. YTruitt Merle  I  have reviewed the above documentation for accuracy and completeness, and I agree with the above.    YTruitt Merle 11/24/2017

## 2017-11-24 ENCOUNTER — Telehealth: Payer: Self-pay | Admitting: Hematology

## 2017-11-24 ENCOUNTER — Other Ambulatory Visit: Payer: Medicaid Other

## 2017-11-24 ENCOUNTER — Inpatient Hospital Stay: Payer: Medicaid Other

## 2017-11-24 ENCOUNTER — Encounter: Payer: Self-pay | Admitting: Hematology

## 2017-11-24 ENCOUNTER — Inpatient Hospital Stay (HOSPITAL_BASED_OUTPATIENT_CLINIC_OR_DEPARTMENT_OTHER): Payer: Medicaid Other | Admitting: Hematology

## 2017-11-24 VITALS — BP 106/70 | HR 87 | Temp 97.8°F | Resp 16

## 2017-11-24 DIAGNOSIS — H905 Unspecified sensorineural hearing loss: Secondary | ICD-10-CM | POA: Diagnosis not present

## 2017-11-24 DIAGNOSIS — F419 Anxiety disorder, unspecified: Secondary | ICD-10-CM | POA: Diagnosis not present

## 2017-11-24 DIAGNOSIS — Z170 Estrogen receptor positive status [ER+]: Secondary | ICD-10-CM | POA: Diagnosis not present

## 2017-11-24 DIAGNOSIS — C50911 Malignant neoplasm of unspecified site of right female breast: Secondary | ICD-10-CM

## 2017-11-24 DIAGNOSIS — Z79811 Long term (current) use of aromatase inhibitors: Secondary | ICD-10-CM

## 2017-11-24 DIAGNOSIS — C7951 Secondary malignant neoplasm of bone: Principal | ICD-10-CM

## 2017-11-24 DIAGNOSIS — Z923 Personal history of irradiation: Secondary | ICD-10-CM

## 2017-11-24 DIAGNOSIS — Z5112 Encounter for antineoplastic immunotherapy: Secondary | ICD-10-CM | POA: Diagnosis not present

## 2017-11-24 DIAGNOSIS — Z79899 Other long term (current) drug therapy: Secondary | ICD-10-CM | POA: Diagnosis not present

## 2017-11-24 DIAGNOSIS — C799 Secondary malignant neoplasm of unspecified site: Principal | ICD-10-CM

## 2017-11-24 DIAGNOSIS — C50919 Malignant neoplasm of unspecified site of unspecified female breast: Secondary | ICD-10-CM

## 2017-11-24 DIAGNOSIS — G4700 Insomnia, unspecified: Secondary | ICD-10-CM | POA: Diagnosis not present

## 2017-11-24 DIAGNOSIS — Z95828 Presence of other vascular implants and grafts: Secondary | ICD-10-CM

## 2017-11-24 LAB — CBC WITH DIFFERENTIAL/PLATELET
Abs Immature Granulocytes: 0.12 10*3/uL — ABNORMAL HIGH (ref 0.00–0.07)
BASOS ABS: 0 10*3/uL (ref 0.0–0.1)
BASOS PCT: 1 %
EOS PCT: 1 %
Eosinophils Absolute: 0 10*3/uL (ref 0.0–0.5)
HCT: 32.2 % — ABNORMAL LOW (ref 36.0–46.0)
Hemoglobin: 10.9 g/dL — ABNORMAL LOW (ref 12.0–15.0)
Immature Granulocytes: 6 %
LYMPHS PCT: 27 %
Lymphs Abs: 0.5 10*3/uL — ABNORMAL LOW (ref 0.7–4.0)
MCH: 31.5 pg (ref 26.0–34.0)
MCHC: 33.9 g/dL (ref 30.0–36.0)
MCV: 93.1 fL (ref 80.0–100.0)
Monocytes Absolute: 0.2 10*3/uL (ref 0.1–1.0)
Monocytes Relative: 12 %
NRBC: 0 % (ref 0.0–0.2)
Neutro Abs: 1 10*3/uL — ABNORMAL LOW (ref 1.7–7.7)
Neutrophils Relative %: 53 %
PLATELETS: 217 10*3/uL (ref 150–400)
RBC: 3.46 MIL/uL — AB (ref 3.87–5.11)
RDW: 13.1 % (ref 11.5–15.5)
WBC: 1.9 10*3/uL — AB (ref 4.0–10.5)

## 2017-11-24 LAB — COMPREHENSIVE METABOLIC PANEL
ALBUMIN: 3.5 g/dL (ref 3.5–5.0)
ALT: 19 U/L (ref 0–44)
AST: 14 U/L — AB (ref 15–41)
Alkaline Phosphatase: 64 U/L (ref 38–126)
Anion gap: 9 (ref 5–15)
BILIRUBIN TOTAL: 2 mg/dL — AB (ref 0.3–1.2)
BUN: 11 mg/dL (ref 6–20)
CHLORIDE: 106 mmol/L (ref 98–111)
CO2: 24 mmol/L (ref 22–32)
Calcium: 9.3 mg/dL (ref 8.9–10.3)
Creatinine, Ser: 0.88 mg/dL (ref 0.44–1.00)
Glucose, Bld: 88 mg/dL (ref 70–99)
POTASSIUM: 3.9 mmol/L (ref 3.5–5.1)
SODIUM: 139 mmol/L (ref 135–145)
TOTAL PROTEIN: 6.5 g/dL (ref 6.5–8.1)

## 2017-11-24 MED ORDER — SODIUM CHLORIDE 0.9 % IV SOLN
20.0000 mg | Freq: Once | INTRAVENOUS | Status: AC
  Administered 2017-11-24: 20 mg via INTRAVENOUS
  Filled 2017-11-24: qty 2

## 2017-11-24 MED ORDER — SODIUM CHLORIDE 0.9 % IV SOLN
Freq: Once | INTRAVENOUS | Status: AC
  Administered 2017-11-24: 10:00:00 via INTRAVENOUS
  Filled 2017-11-24: qty 250

## 2017-11-24 MED ORDER — SODIUM CHLORIDE 0.9% FLUSH
10.0000 mL | Freq: Once | INTRAVENOUS | Status: AC
  Administered 2017-11-24: 10 mL
  Filled 2017-11-24: qty 10

## 2017-11-24 MED ORDER — HEPARIN SOD (PORK) LOCK FLUSH 100 UNIT/ML IV SOLN
500.0000 [IU] | Freq: Once | INTRAVENOUS | Status: AC | PRN
  Administered 2017-11-24: 500 [IU]
  Filled 2017-11-24: qty 5

## 2017-11-24 MED ORDER — DEXAMETHASONE SODIUM PHOSPHATE 10 MG/ML IJ SOLN
INTRAMUSCULAR | Status: AC
  Filled 2017-11-24: qty 1

## 2017-11-24 MED ORDER — HYDROCODONE-ACETAMINOPHEN 10-325 MG PO TABS: 1.0000 | ORAL_TABLET | Freq: Four times a day (QID) | ORAL | 0 refills | Status: DC | PRN

## 2017-11-24 MED ORDER — DEXAMETHASONE SODIUM PHOSPHATE 10 MG/ML IJ SOLN
10.0000 mg | Freq: Once | INTRAMUSCULAR | Status: AC
  Administered 2017-11-24: 10 mg via INTRAVENOUS

## 2017-11-24 MED ORDER — SODIUM CHLORIDE 0.9 % IV SOLN
80.0000 mg/m2 | Freq: Once | INTRAVENOUS | Status: AC
  Administered 2017-11-24: 132 mg via INTRAVENOUS
  Filled 2017-11-24: qty 22

## 2017-11-24 MED ORDER — FAMOTIDINE IN NACL 20-0.9 MG/50ML-% IV SOLN: 20.0000 mg | Freq: Once | INTRAVENOUS | Status: DC

## 2017-11-24 MED ORDER — SODIUM CHLORIDE 0.9% FLUSH
10.0000 mL | INTRAVENOUS | Status: DC | PRN
  Administered 2017-11-24: 10 mL
  Filled 2017-11-24: qty 10

## 2017-11-24 NOTE — Telephone Encounter (Signed)
Scheduled appt per 11/13 los - gave patient AVS and calender per los.   

## 2017-11-24 NOTE — Patient Instructions (Signed)
[facility] Discharge Instructions for Patients Receiving Chemotherapy  Today you received the following chemotherapy agents :  Taxol.  To help prevent nausea and vomiting after your treatment, we encourage you to take your nausea medication as prescribed.   If you develop nausea and vomiting that is not controlled by your nausea medication, call the clinic.   BELOW ARE SYMPTOMS THAT SHOULD BE REPORTED IMMEDIATELY:  *FEVER GREATER THAN 100.5 F  *CHILLS WITH OR WITHOUT FEVER  NAUSEA AND VOMITING THAT IS NOT CONTROLLED WITH YOUR NAUSEA MEDICATION  *UNUSUAL SHORTNESS OF BREATH  *UNUSUAL BRUISING OR BLEEDING  TENDERNESS IN MOUTH AND THROAT WITH OR WITHOUT PRESENCE OF ULCERS  *URINARY PROBLEMS  *BOWEL PROBLEMS  UNUSUAL RASH Items with * indicate a potential emergency and should be followed up as soon as possible.  Feel free to call the clinic should you have any questions or concerns. The clinic phone number is [phone].  Please show the CHEMO ALERT CARD at check-in to the Emergency Department and triage nurse.   

## 2017-11-24 NOTE — Progress Notes (Signed)
Okay to proceed with treatment of Taxol with ANC of 1.0 and bilirubin of 2.0 today per Dr. Burr Medico.

## 2017-12-01 ENCOUNTER — Other Ambulatory Visit: Payer: Medicaid Other

## 2017-12-01 ENCOUNTER — Ambulatory Visit: Payer: Medicaid Other

## 2017-12-01 ENCOUNTER — Ambulatory Visit: Payer: Medicaid Other | Admitting: Hematology

## 2017-12-02 ENCOUNTER — Inpatient Hospital Stay: Payer: Medicaid Other

## 2017-12-02 VITALS — BP 111/60 | HR 76 | Temp 97.6°F | Resp 16

## 2017-12-02 DIAGNOSIS — C50911 Malignant neoplasm of unspecified site of right female breast: Secondary | ICD-10-CM

## 2017-12-02 DIAGNOSIS — Z5112 Encounter for antineoplastic immunotherapy: Secondary | ICD-10-CM | POA: Diagnosis not present

## 2017-12-02 DIAGNOSIS — C7951 Secondary malignant neoplasm of bone: Principal | ICD-10-CM

## 2017-12-02 MED ORDER — TRASTUZUMAB CHEMO 150 MG IV SOLR
6.0000 mg/kg | Freq: Once | INTRAVENOUS | Status: AC
  Administered 2017-12-02: 336 mg via INTRAVENOUS
  Filled 2017-12-02: qty 16

## 2017-12-02 MED ORDER — LORATADINE 10 MG PO TABS
ORAL_TABLET | ORAL | Status: AC
  Filled 2017-12-02: qty 1

## 2017-12-02 MED ORDER — DEXAMETHASONE SODIUM PHOSPHATE 10 MG/ML IJ SOLN
INTRAMUSCULAR | Status: AC
  Filled 2017-12-02: qty 1

## 2017-12-02 MED ORDER — SODIUM CHLORIDE 0.9 % IV SOLN
Freq: Once | INTRAVENOUS | Status: AC
  Administered 2017-12-02: 10:00:00 via INTRAVENOUS
  Filled 2017-12-02: qty 250

## 2017-12-02 MED ORDER — HEPARIN SOD (PORK) LOCK FLUSH 100 UNIT/ML IV SOLN
500.0000 [IU] | Freq: Once | INTRAVENOUS | Status: AC | PRN
  Administered 2017-12-02: 500 [IU]
  Filled 2017-12-02: qty 5

## 2017-12-02 MED ORDER — DEXAMETHASONE SODIUM PHOSPHATE 10 MG/ML IJ SOLN
10.0000 mg | Freq: Once | INTRAMUSCULAR | Status: AC
  Administered 2017-12-02: 10 mg via INTRAVENOUS

## 2017-12-02 MED ORDER — ACETAMINOPHEN 325 MG PO TABS
650.0000 mg | ORAL_TABLET | Freq: Once | ORAL | Status: AC
  Administered 2017-12-02: 650 mg via ORAL

## 2017-12-02 MED ORDER — ACETAMINOPHEN 325 MG PO TABS
ORAL_TABLET | ORAL | Status: AC
  Filled 2017-12-02: qty 2

## 2017-12-02 MED ORDER — SODIUM CHLORIDE 0.9 % IV SOLN
420.0000 mg | Freq: Once | INTRAVENOUS | Status: AC
  Administered 2017-12-02: 420 mg via INTRAVENOUS
  Filled 2017-12-02: qty 14

## 2017-12-02 MED ORDER — FAMOTIDINE IN NACL 20-0.9 MG/50ML-% IV SOLN
20.0000 mg | Freq: Once | INTRAVENOUS | Status: AC
  Administered 2017-12-02: 20 mg via INTRAVENOUS

## 2017-12-02 MED ORDER — FAMOTIDINE IN NACL 20-0.9 MG/50ML-% IV SOLN
INTRAVENOUS | Status: AC
  Filled 2017-12-02: qty 50

## 2017-12-02 MED ORDER — SODIUM CHLORIDE 0.9% FLUSH
10.0000 mL | INTRAVENOUS | Status: DC | PRN
  Administered 2017-12-02: 10 mL
  Filled 2017-12-02: qty 10

## 2017-12-02 MED ORDER — LORATADINE 10 MG PO TABS
10.0000 mg | ORAL_TABLET | Freq: Every day | ORAL | Status: DC
  Administered 2017-12-02: 10 mg via ORAL

## 2017-12-02 NOTE — Patient Instructions (Signed)
[facility] Discharge Instructions for Patients Receiving Chemotherapy  Today you received the following chemotherapy agents Herceptin/Perjeta To help prevent nausea and vomiting after your treatment, we encourage you to take your nausea medication as prescribed.   If you develop nausea and vomiting that is not controlled by your nausea medication, call the clinic.   BELOW ARE SYMPTOMS THAT SHOULD BE REPORTED IMMEDIATELY:  *FEVER GREATER THAN 100.5 F  *CHILLS WITH OR WITHOUT FEVER  NAUSEA AND VOMITING THAT IS NOT CONTROLLED WITH YOUR NAUSEA MEDICATION  *UNUSUAL SHORTNESS OF BREATH  *UNUSUAL BRUISING OR BLEEDING  TENDERNESS IN MOUTH AND THROAT WITH OR WITHOUT PRESENCE OF ULCERS  *URINARY PROBLEMS  *BOWEL PROBLEMS  UNUSUAL RASH Items with * indicate a potential emergency and should be followed up as soon as possible.  Feel free to call the clinic should you have any questions or concerns. The clinic phone number is [phone].  Please show the CHEMO ALERT CARD at check-in to the Emergency Department and triage nurse.   

## 2017-12-02 NOTE — Progress Notes (Signed)
Okay per Dr. Burr Medico to proceed with treatment with labs done today.

## 2017-12-03 ENCOUNTER — Other Ambulatory Visit: Payer: Self-pay | Admitting: Cardiology

## 2017-12-03 ENCOUNTER — Ambulatory Visit (HOSPITAL_COMMUNITY)
Admission: RE | Admit: 2017-12-03 | Discharge: 2017-12-03 | Disposition: A | Discharge status: Home / Self Care | Payer: Medicaid Other | Source: Ambulatory Visit | Attending: Cardiology | Admitting: Cardiology

## 2017-12-03 DIAGNOSIS — R0789 Other chest pain: Secondary | ICD-10-CM | POA: Diagnosis not present

## 2017-12-03 DIAGNOSIS — R079 Chest pain, unspecified: Secondary | ICD-10-CM

## 2017-12-03 NOTE — Progress Notes (Signed)
Clarified with MD. No labs needed today. Ok to proceed with Herceptin/Perjeta today.  Hardie Pulley, PharmD, BCPS, BCOP

## 2017-12-06 ENCOUNTER — Telehealth: Payer: Self-pay

## 2017-12-06 NOTE — Telephone Encounter (Signed)
Notes recorded by Sueanne Margarita, MD on 12/04/2017 at 3:44 PM EST Please let patient know that stress test was abnormal - please find out if we can get a coronary CTA with FFR next week - if not then needs cardiac cath

## 2017-12-06 NOTE — Telephone Encounter (Signed)
Spoke with Dr. Radford Pax, the patient would not be able to get a cardiac CTA soon enough, a cardiac cath appointment was schedule on 11/27. Spoke with the interpreting service and contacted the patient, she accepted doing to the appointment and understands her echo stress results. Will set up cath at that time.

## 2017-12-08 ENCOUNTER — Ambulatory Visit (INDEPENDENT_AMBULATORY_CARE_PROVIDER_SITE_OTHER): Payer: Medicaid Other | Admitting: Cardiology

## 2017-12-08 ENCOUNTER — Encounter: Payer: Self-pay | Admitting: Cardiology

## 2017-12-08 DIAGNOSIS — R9439 Abnormal result of other cardiovascular function study: Secondary | ICD-10-CM

## 2017-12-08 NOTE — Patient Instructions (Addendum)
Medication Instructions:  Your physician recommends that you continue on your current medications as directed. Please refer to the Current Medication list given to you today.  If you need a refill on your cardiac medications before your next appointment, please call your pharmacy.   Lab work:  If you have labs (blood work) drawn today and your tests are completely normal, you will receive your results only by: Marland Kitchen MyChart Message (if you have MyChart) OR . A paper copy in the mail If you have any lab test that is abnormal or we need to change your treatment, we will call you to review the results.  Testing/Procedures:    [facility] OFF[facility] [place], [address] [place] [address] Dept: [phone] Loc: [phone]  LEYLANY [name]  12/08/2017  You are scheduled for a Cardiac Catheterization on Monday, December 2 with Dr. Glenetta Hew.  1. Please arrive at the [facility] (Main Entrance A) at [facility]: [address] [place], [place] [address] at 7:30 AM (This time is two hours before your procedure to ensure your preparation). Free valet parking service is available. Arrive at 5:30 to prepare for your procedure.   Special note: Every effort is made to have your procedure done on time. Please understand that emergencies sometimes delay scheduled procedures.  2. Diet: Do not eat solid foods after midnight.  The patient may have clear liquids until 5am upon the day of the procedure.  3. Medication instructions in preparation for your procedure:   Contrast Allergy: No 5. Plan for one night stay--bring personal belongings. 6. Bring a current list of your medications and current insurance cards. 7. You MUST have a responsible person to drive you home. 8. Someone MUST be with you the first 24 hours after you arrive home or your discharge will be delayed. 9. Please wear clothes that are  easy to get on and off and wear slip-on shoes.  Thank you for allowing [place] to care for you!   -- [facility]  Follow-Up: 2-3 weeks follow up post-cath with PA or NP

## 2017-12-08 NOTE — H&P (View-Only) (Signed)
Cardiology Office Note:    Date:  12/08/2017   ID:  [name], DOB 01-23-1965, MRN [id number]  PCP:  [name], [name]  Cardiologist:  No primary care provider on file.    Referring MD: [name],*   Chief Complaint  Patient presents with  . Follow-up    chest pain and abnormal stress echo    History of Present Illness:    [name] is a 52 y.o. 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  This is somewhat atypical in that it is sharp and stabbing but does radiate into her shoulder, neck and left arm.  She underwent stress echo which showed resting inferior HK which worsened with exercise and consistent with ischemia in the RCA territory.  She is now here to discuss the results and set up for cardiac cath.    Past Medical History:  Diagnosis Date  . Abnormal Pap smear   . Anxiety   . Breast cancer ([place])   . Cancer ([place]) 1999   breast-s/p mastectomy, chemo, rad  . CIN I (cervical intraepithelial neoplasia I) 2003   by colpo  . Congenital deafness   . Deaf   . Depression   . Radiation 07/12/15-07/26/15   left femur 30 Gy  . S/P bilateral oophorectomy     Past Surgical History:  Procedure Laterality Date  . ABDOMINAL HYSTERECTOMY    . INTRAMEDULLARY (IM) NAIL INTERTROCHANTERIC Left 06/26/2015   Procedure: LEFT BIOMET LONG AFFIXS NAIL;  Surgeon: [name], MD;  Location: [facility];  Service: Orthopedics;  Laterality: Left;  . IR GENERIC HISTORICAL  08/06/2015   IR US GUIDE VASC ACCESS LEFT 08/06/2015 Aletta Edouard, MD [facility]  . IR GENERIC HISTORICAL  08/06/2015   IR FLUORO GUIDE CV LINE LEFT 08/06/2015 Aletta Edouard, MD [facility]  . IR GENERIC HISTORICAL  03/05/2016   IR CV LINE INJECTION 03/05/2016 Markus Daft, MD [facility]  . MASTECTOMY     right breast  . MASTECTOMY    .  OVARIAN CYST REMOVAL    . RADIOLOGY WITH ANESTHESIA Left 06/25/2015   Procedure: MRI OF LEFT HIP WITH OR WITHOUT CONTRAST;  Surgeon: Medication Radiologist, MD;  Location: [facility];  Service: Radiology;  Laterality: Left;  DR. MCINTYRE/MRI  . TUBAL LIGATION      Current Medications: Current Meds  Medication Sig  . ALPRAZolam (XANAX) 0.25 MG tablet Take 1 tablet (0.25 mg total) by mouth 2 (two) times daily as needed for anxiety.  . divalproex (DEPAKOTE) 250 MG DR tablet Take 1 tablet (250 mg total) by mouth at bedtime.  Marland Kitchen HYDROcodone-acetaminophen (NORCO) 10-325 MG tablet Take 1 tablet by mouth every 6 (six) hours as needed.  . lidocaine-prilocaine (EMLA) cream Apply 1 application topically as needed. Apply to Porta-Cath 1-2 hours prior to access as directed.  . potassium chloride (K-DUR) 10 MEQ tablet Take 1 tablet (10 mEq total) by mouth 2 (two) times daily.     Allergies:   Chlorhexidine; Promethazine hcl; and Diphenhydramine hcl   Social History   Socioeconomic History  . Marital status: Married    Spouse name: Not on file  . Number of children: 2  . Years of education: Not on file  . Highest education level: Not on file  Occupational History  . Not on file  Social  Needs  . Financial resource strain: Not on file  . Food insecurity:    Worry: Not on file    Inability: Not on file  . Transportation needs:    Medical: Not on file    Non-medical: Not on file  Tobacco Use  . Smoking status: Never Smoker  . Smokeless tobacco: Never Used  Substance and Sexual Activity  . Alcohol use: No  . Drug use: No  . Sexual activity: Yes    Birth control/protection: Surgical  Lifestyle  . Physical activity:    Days per week: Not on file    Minutes per session: Not on file  . Stress: Not on file  Relationships  . Social connections:    Talks on phone: Not on file    Gets together: Not on file    Attends religious service: Not on file    Active member of club or organization: Not on  file    Attends meetings of clubs or organizations: Not on file    Relationship status: Not on file  Other Topics Concern  . Not on file  Social History Narrative  . Not on file     Family History: The patient's family history includes Alzheimer's disease in her maternal uncle; Heart attack in her maternal grandfather; Hypertension in her mother; Pancreatitis in her maternal grandmother; Seizures in her mother.  ROS:   Please see the history of present illness.    ROS  All other systems reviewed and negative.   EKGs/Labs/Other Studies Reviewed:    The following studies were reviewed today:   EKG:  EKG is not ordered today.    Recent Labs: 11/24/2017: ALT 19; BUN 11; Creatinine, Ser 0.88; Hemoglobin 10.9; Platelets 217; Potassium 3.9; Sodium 139   Recent Lipid Panel No results found for: CHOL, TRIG, HDL, CHOLHDL, VLDL, LDLCALC, LDLDIRECT  Physical Exam:    VS:  Ht 5\' 7"  (1.702 m)   BMI 20.05 kg/m     Wt Readings from Last 3 Encounters:  11/19/17 128 lb (58.1 kg)  11/10/17 127 lb (57.6 kg)  10/27/17 127 lb 11.2 oz (57.9 kg)     GEN:  Well nourished, well developed in no acute distress HEENT: Normal NECK: No JVD; No carotid bruits LYMPHATICS: No lymphadenopathy CARDIAC: RRR, no murmurs, rubs, gallops RESPIRATORY:  Clear to auscultation without rales, wheezing or rhonchi  ABDOMEN: Soft, non-tender, non-distended MUSCULOSKELETAL:  No edema; No deformity  SKIN: Warm and dry NEUROLOGIC:  Alert and oriented x 3 PSYCHIATRIC:  Normal affect   ASSESSMENT:    1. Abnormal stress echocardiogram    PLAN:    In order of problems listed above:  1.  Chest pain with abnormal stress echo.  I have reviewed the findings of the stress echo which showed baseline resting inferior HK which worsened with exercise and was consistent with inducible ischemia.  I have recommended proceeding with cardiac cath for further evaluation as the wait for coronary CTA is long.  She will be set  up for cardiac cath next week.  Cardiac catheterization was discussed with the patient fully. The patient understands that risks include but are not limited to stroke (1 in 1000), death (1 in 16), kidney failure [usually temporary] (1 in 500), bleeding (1 in 200), allergic reaction [possibly serious] (1 in 200).  The patient understands and is willing to proceed.  I will start her on ASA 81mg  daily and check an FLP.    Medication Adjustments/Labs and Tests Ordered: Current medicines  are reviewed at length with the patient today.  Concerns regarding medicines are outlined above.  No orders of the defined types were placed in this encounter.  No orders of the defined types were placed in this encounter.   Signed, Fransico Him, MD  12/08/2017 8:52 AM    [place]

## 2017-12-08 NOTE — Progress Notes (Signed)
Cardiology Office Note:    Date:  12/08/2017   ID:  [name], DOB Aug 17, 1965, MRN [id number]  PCP:  [name], [name]  Cardiologist:  No primary care provider on file.    Referring MD: [name],*   Chief Complaint  Patient presents with  . Follow-up    chest pain and abnormal stress echo    History of Present Illness:    [name] is a 52 y.o. 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  This is somewhat atypical in that it is sharp and stabbing but does radiate into her shoulder, neck and left arm.  She underwent stress echo which showed resting inferior HK which worsened with exercise and consistent with ischemia in the RCA territory.  She is now here to discuss the results and set up for cardiac cath.    Past Medical History:  Diagnosis Date  . Abnormal Pap smear   . Anxiety   . Breast cancer ([place])   . Cancer ([facility]) 1999   breast-s/p mastectomy, chemo, rad  . CIN I (cervical intraepithelial neoplasia I) 2003   by colpo  . Congenital deafness   . Deaf   . Depression   . Radiation 07/12/15-07/26/15   left femur 30 Gy  . S/P bilateral oophorectomy     Past Surgical History:  Procedure Laterality Date  . ABDOMINAL HYSTERECTOMY    . INTRAMEDULLARY (IM) NAIL INTERTROCHANTERIC Left 06/26/2015   Procedure: LEFT BIOMET LONG AFFIXS NAIL;  Surgeon: [name], MD;  Location: [facility];  Service: Orthopedics;  Laterality: Left;  . IR GENERIC HISTORICAL  08/06/2015   IR US GUIDE VASC ACCESS LEFT 08/06/2015 Aletta Edouard, MD [facility]  . IR GENERIC HISTORICAL  08/06/2015   IR FLUORO GUIDE CV LINE LEFT 08/06/2015 Aletta Edouard, MD [facility]  . IR GENERIC HISTORICAL  03/05/2016   IR CV LINE INJECTION 03/05/2016 Markus Daft, MD [facility]  . MASTECTOMY     right breast  . MASTECTOMY    .  OVARIAN CYST REMOVAL    . RADIOLOGY WITH ANESTHESIA Left 06/25/2015   Procedure: MRI OF LEFT HIP WITH OR WITHOUT CONTRAST;  Surgeon: Medication Radiologist, MD;  Location: [facility];  Service: Radiology;  Laterality: Left;  DR. MCINTYRE/MRI  . TUBAL LIGATION      Current Medications: Current Meds  Medication Sig  . ALPRAZolam (XANAX) 0.25 MG tablet Take 1 tablet (0.25 mg total) by mouth 2 (two) times daily as needed for anxiety.  . divalproex (DEPAKOTE) 250 MG DR tablet Take 1 tablet (250 mg total) by mouth at bedtime.  Marland Kitchen HYDROcodone-acetaminophen (NORCO) 10-325 MG tablet Take 1 tablet by mouth every 6 (six) hours as needed.  . lidocaine-prilocaine (EMLA) cream Apply 1 application topically as needed. Apply to Porta-Cath 1-2 hours prior to access as directed.  . potassium chloride (K-DUR) 10 MEQ tablet Take 1 tablet (10 mEq total) by mouth 2 (two) times daily.     Allergies:   Chlorhexidine; Promethazine hcl; and Diphenhydramine hcl   Social History   Socioeconomic History  . Marital status: Married    Spouse name: Not on file  . Number of children: 2  . Years of education: Not on file  . Highest education level: Not on file  Occupational History  . Not on file  Social  Needs  . Financial resource strain: Not on file  . Food insecurity:    Worry: Not on file    Inability: Not on file  . Transportation needs:    Medical: Not on file    Non-medical: Not on file  Tobacco Use  . Smoking status: Never Smoker  . Smokeless tobacco: Never Used  Substance and Sexual Activity  . Alcohol use: No  . Drug use: No  . Sexual activity: Yes    Birth control/protection: Surgical  Lifestyle  . Physical activity:    Days per week: Not on file    Minutes per session: Not on file  . Stress: Not on file  Relationships  . Social connections:    Talks on phone: Not on file    Gets together: Not on file    Attends religious service: Not on file    Active member of club or organization: Not on  file    Attends meetings of clubs or organizations: Not on file    Relationship status: Not on file  Other Topics Concern  . Not on file  Social History Narrative  . Not on file     Family History: The patient's family history includes Alzheimer's disease in her maternal uncle; Heart attack in her maternal grandfather; Hypertension in her mother; Pancreatitis in her maternal grandmother; Seizures in her mother.  ROS:   Please see the history of present illness.    ROS  All other systems reviewed and negative.   EKGs/Labs/Other Studies Reviewed:    The following studies were reviewed today:   EKG:  EKG is not ordered today.    Recent Labs: 11/24/2017: ALT 19; BUN 11; Creatinine, Ser 0.88; Hemoglobin 10.9; Platelets 217; Potassium 3.9; Sodium 139   Recent Lipid Panel No results found for: CHOL, TRIG, HDL, CHOLHDL, VLDL, LDLCALC, LDLDIRECT  Physical Exam:    VS:  Ht 5\' 7"  (1.702 m)   BMI 20.05 kg/m     Wt Readings from Last 3 Encounters:  11/19/17 128 lb (58.1 kg)  11/10/17 127 lb (57.6 kg)  10/27/17 127 lb 11.2 oz (57.9 kg)     GEN:  Well nourished, well developed in no acute distress HEENT: Normal NECK: No JVD; No carotid bruits LYMPHATICS: No lymphadenopathy CARDIAC: RRR, no murmurs, rubs, gallops RESPIRATORY:  Clear to auscultation without rales, wheezing or rhonchi  ABDOMEN: Soft, non-tender, non-distended MUSCULOSKELETAL:  No edema; No deformity  SKIN: Warm and dry NEUROLOGIC:  Alert and oriented x 3 PSYCHIATRIC:  Normal affect   ASSESSMENT:    1. Abnormal stress echocardiogram    PLAN:    In order of problems listed above:  1.  Chest pain with abnormal stress echo.  I have reviewed the findings of the stress echo which showed baseline resting inferior HK which worsened with exercise and was consistent with inducible ischemia.  I have recommended proceeding with cardiac cath for further evaluation as the wait for coronary CTA is long.  She will be set  up for cardiac cath next week.  Cardiac catheterization was discussed with the patient fully. The patient understands that risks include but are not limited to stroke (1 in 1000), death (1 in 63), kidney failure [usually temporary] (1 in 500), bleeding (1 in 200), allergic reaction [possibly serious] (1 in 200).  The patient understands and is willing to proceed.  I will start her on ASA 81mg  daily and check an FLP.    Medication Adjustments/Labs and Tests Ordered: Current medicines  are reviewed at length with the patient today.  Concerns regarding medicines are outlined above.  No orders of the defined types were placed in this encounter.  No orders of the defined types were placed in this encounter.   Signed, Fransico Him, MD  12/08/2017 8:52 AM    [place]

## 2017-12-10 ENCOUNTER — Inpatient Hospital Stay: Payer: Medicaid Other

## 2017-12-10 ENCOUNTER — Inpatient Hospital Stay (HOSPITAL_BASED_OUTPATIENT_CLINIC_OR_DEPARTMENT_OTHER): Payer: Medicaid Other | Admitting: Oncology

## 2017-12-10 ENCOUNTER — Encounter: Payer: Self-pay | Admitting: Oncology

## 2017-12-10 VITALS — BP 117/63 | HR 70 | Temp 98.0°F | Resp 16

## 2017-12-10 DIAGNOSIS — C799 Secondary malignant neoplasm of unspecified site: Principal | ICD-10-CM

## 2017-12-10 DIAGNOSIS — Z923 Personal history of irradiation: Secondary | ICD-10-CM

## 2017-12-10 DIAGNOSIS — C50911 Malignant neoplasm of unspecified site of right female breast: Secondary | ICD-10-CM | POA: Diagnosis not present

## 2017-12-10 DIAGNOSIS — C50919 Malignant neoplasm of unspecified site of unspecified female breast: Secondary | ICD-10-CM

## 2017-12-10 DIAGNOSIS — G4700 Insomnia, unspecified: Secondary | ICD-10-CM

## 2017-12-10 DIAGNOSIS — Z79811 Long term (current) use of aromatase inhibitors: Secondary | ICD-10-CM | POA: Diagnosis not present

## 2017-12-10 DIAGNOSIS — C7951 Secondary malignant neoplasm of bone: Secondary | ICD-10-CM

## 2017-12-10 DIAGNOSIS — H905 Unspecified sensorineural hearing loss: Secondary | ICD-10-CM

## 2017-12-10 DIAGNOSIS — F419 Anxiety disorder, unspecified: Secondary | ICD-10-CM | POA: Diagnosis not present

## 2017-12-10 DIAGNOSIS — Z170 Estrogen receptor positive status [ER+]: Secondary | ICD-10-CM

## 2017-12-10 DIAGNOSIS — Z5112 Encounter for antineoplastic immunotherapy: Secondary | ICD-10-CM | POA: Diagnosis not present

## 2017-12-10 DIAGNOSIS — Z95828 Presence of other vascular implants and grafts: Secondary | ICD-10-CM

## 2017-12-10 DIAGNOSIS — Z79899 Other long term (current) drug therapy: Secondary | ICD-10-CM

## 2017-12-10 LAB — CBC WITH DIFFERENTIAL/PLATELET
Abs Immature Granulocytes: 0.17 10*3/uL — ABNORMAL HIGH (ref 0.00–0.07)
Basophils Absolute: 0 10*3/uL (ref 0.0–0.1)
Basophils Relative: 1 %
EOS ABS: 0 10*3/uL (ref 0.0–0.5)
Eosinophils Relative: 1 %
HCT: 29.6 % — ABNORMAL LOW (ref 36.0–46.0)
Hemoglobin: 10 g/dL — ABNORMAL LOW (ref 12.0–15.0)
Immature Granulocytes: 4 %
Lymphocytes Relative: 18 %
Lymphs Abs: 0.7 10*3/uL (ref 0.7–4.0)
MCH: 31.4 pg (ref 26.0–34.0)
MCHC: 33.8 g/dL (ref 30.0–36.0)
MCV: 93.1 fL (ref 80.0–100.0)
MONO ABS: 0.5 10*3/uL (ref 0.1–1.0)
MONOS PCT: 11 %
NEUTROS ABS: 2.6 10*3/uL (ref 1.7–7.7)
NEUTROS PCT: 65 %
PLATELETS: 143 10*3/uL — AB (ref 150–400)
RBC: 3.18 MIL/uL — AB (ref 3.87–5.11)
RDW: 14 % (ref 11.5–15.5)
WBC: 4 10*3/uL (ref 4.0–10.5)
nRBC: 0 % (ref 0.0–0.2)

## 2017-12-10 LAB — COMPREHENSIVE METABOLIC PANEL
ALK PHOS: 61 U/L (ref 38–126)
ALT: 17 U/L (ref 0–44)
ANION GAP: 8 (ref 5–15)
AST: 14 U/L — ABNORMAL LOW (ref 15–41)
Albumin: 3.4 g/dL — ABNORMAL LOW (ref 3.5–5.0)
BILIRUBIN TOTAL: 1.8 mg/dL — AB (ref 0.3–1.2)
BUN: 13 mg/dL (ref 6–20)
CALCIUM: 8.7 mg/dL — AB (ref 8.9–10.3)
CO2: 24 mmol/L (ref 22–32)
CREATININE: 0.7 mg/dL (ref 0.44–1.00)
Chloride: 108 mmol/L (ref 98–111)
Glucose, Bld: 89 mg/dL (ref 70–99)
Potassium: 3.5 mmol/L (ref 3.5–5.1)
Sodium: 140 mmol/L (ref 135–145)
TOTAL PROTEIN: 6.1 g/dL — AB (ref 6.5–8.1)

## 2017-12-10 MED ORDER — SODIUM CHLORIDE 0.9 % IV SOLN
Freq: Once | INTRAVENOUS | Status: AC
  Administered 2017-12-10: 10:00:00 via INTRAVENOUS
  Filled 2017-12-10: qty 250

## 2017-12-10 MED ORDER — DEXAMETHASONE SODIUM PHOSPHATE 10 MG/ML IJ SOLN
10.0000 mg | Freq: Once | INTRAMUSCULAR | Status: AC
  Administered 2017-12-10: 10 mg via INTRAVENOUS

## 2017-12-10 MED ORDER — FAMOTIDINE IN NACL 20-0.9 MG/50ML-% IV SOLN
20.0000 mg | Freq: Once | INTRAVENOUS | Status: AC
  Administered 2017-12-10: 20 mg via INTRAVENOUS

## 2017-12-10 MED ORDER — SODIUM CHLORIDE 0.9% FLUSH
10.0000 mL | Freq: Once | INTRAVENOUS | Status: AC
  Administered 2017-12-10: 10 mL
  Filled 2017-12-10: qty 10

## 2017-12-10 MED ORDER — DEXAMETHASONE SODIUM PHOSPHATE 10 MG/ML IJ SOLN
INTRAMUSCULAR | Status: AC
  Filled 2017-12-10: qty 1

## 2017-12-10 MED ORDER — FAMOTIDINE IN NACL 20-0.9 MG/50ML-% IV SOLN
INTRAVENOUS | Status: AC
  Filled 2017-12-10: qty 50

## 2017-12-10 MED ORDER — HEPARIN SOD (PORK) LOCK FLUSH 100 UNIT/ML IV SOLN
500.0000 [IU] | Freq: Once | INTRAVENOUS | Status: AC | PRN
  Administered 2017-12-10: 500 [IU]
  Filled 2017-12-10: qty 5

## 2017-12-10 MED ORDER — SODIUM CHLORIDE 0.9% FLUSH
10.0000 mL | INTRAVENOUS | Status: DC | PRN
  Administered 2017-12-10: 10 mL
  Filled 2017-12-10: qty 10

## 2017-12-10 MED ORDER — ALPRAZOLAM 0.25 MG PO TABS: 0.2500 mg | ORAL_TABLET | Freq: Two times a day (BID) | ORAL | 0 refills | Status: DC | PRN

## 2017-12-10 MED ORDER — SODIUM CHLORIDE 0.9 % IV SOLN
80.0000 mg/m2 | Freq: Once | INTRAVENOUS | Status: AC
  Administered 2017-12-10: 132 mg via INTRAVENOUS
  Filled 2017-12-10: qty 22

## 2017-12-10 MED FILL — ALPRAZolam 0.25 MG TABS: 0.25 | 15 days supply | Qty: 30 | Fill #0

## 2017-12-10 NOTE — Patient Instructions (Signed)

## 2017-12-10 NOTE — Patient Instructions (Signed)
[facility] Discharge Instructions for Patients Receiving Chemotherapy  Today you received the following chemotherapy agents taxol (Paclitaxel) To help prevent nausea and vomiting after your treatment, we encourage you to take your nausea medication as prescribed.   If you develop nausea and vomiting that is not controlled by your nausea medication, call the clinic.   BELOW ARE SYMPTOMS THAT SHOULD BE REPORTED IMMEDIATELY:  *FEVER GREATER THAN 100.5 F  *CHILLS WITH OR WITHOUT FEVER  NAUSEA AND VOMITING THAT IS NOT CONTROLLED WITH YOUR NAUSEA MEDICATION  *UNUSUAL SHORTNESS OF BREATH  *UNUSUAL BRUISING OR BLEEDING  TENDERNESS IN MOUTH AND THROAT WITH OR WITHOUT PRESENCE OF ULCERS  *URINARY PROBLEMS  *BOWEL PROBLEMS  UNUSUAL RASH Items with * indicate a potential emergency and should be followed up as soon as possible.  Feel free to call the clinic should you have any questions or concerns. The clinic phone number is [phone].  Please show the [facility] at check-in to the Emergency Department and triage nurse.

## 2017-12-10 NOTE — Assessment & Plan Note (Signed)
Patient reports anxiety.  She uses Xanax on as-needed basis.  This has been effective for her.  A refill of Xanax was provided to the patient today.

## 2017-12-10 NOTE — Assessment & Plan Note (Signed)
This is a pleasant 52 year old female with ER positive and HER-2 positive metastatic breast cancer.  She is currently receiving treatment with weekly Taxol and Herceptin and Perjeta every 3 weeks.  Chemo was held last week due to neutropenia.  Counts have improved.  Bilirubin is slightly elevated at 1.8 which is consistent with her baseline.  Recommend for her to proceed with weekly Taxol today as scheduled.  She will follow-up next week for labs and chemotherapy and have a follow-up visit in 2 weeks.

## 2017-12-10 NOTE — Progress Notes (Signed)
[facility] OFFICE PROGRESS NOTE  [name], [name], [name] [place] [address]  Oncology History    Cancer Staging No matching staging information was found for the patient.       [name], right ([place])   11/1997 Cancer Diagnosis    Patient had 1.4 cm poorly differentiated right breast carcinoma diagnosed in Nov 1999 at age 52, 1/7 axillary nodes involved, ER/PR and HER 2 positive. She had mastectomy with the 7 axillary node evaluation, 4 cycles of adriamycin/ cytoxan followed by taxotere, then five years of tamoxifen thru June 2005 (no herceptin in 1999). She had bilateral oophorectomy in May 2005. She was briefly on aromatase inhibitor after tamoxifen, but discontinued this herself due to poor tolerance.    04/04/2015 Genetic Testing    Negative genetic testing on the breast/ovarian cancer pnael.  The Breast/Ovarian gene panel offered by [employer] includes sequencing and rearrangement analysis for the following 20 genes:  ATM, BARD1, BRCA1, BRCA2, BRIP1, CDH1, CHEK2, EPCAM, FANCC, MLH1, MSH2, MSH6, NBN, PALB2, PMS2, PTEN, RAD51C, RAD51D, TP53, and XRCC2.    06/26/2015 Pathology Results    Initial Path Report Bone, curettage, Left femur met, intramedullary subtroch tissue - METASTATIC ADENOCARCINOMA.  Estrogen Receptor: 95%, POSITIVE, STRONG STAINING INTENSITY Progesterone Receptor: 2%, POSITIVE, STRONG  HER2 (+), IHC 3+    06/26/2015 Surgery    Biopsy of metastatic tissue and stabilization with Affixus trochanteric nail, proximal and distal interlock of left femur by Dr. [name]     06/28/2015 Imaging    CT Chest w/ Contrast 1. Extensive right internal mammary chain lymphadenopathy consistent with metastatic breast cancer. 2. Lytic metastases involving the posterior elements at T1 with probable nondisplaced pathologic fracture of the spinous process. 3. No other evidence of thoracic metastatic disease. 4.  Trace bilateral pleural effusions with associated bibasilar atelectasis. 5. Indeterminate right thyroid nodule, not previously imaged. This could be evaluated with thyroid ultrasound as clinically warranted.    07/01/2015 Imaging    Bone Scan 1. Increased activity noted throughout the left femur. Although metastatic disease cannot be excluded, these changes are most likely secondary to prior surgery. 2. No other focal abnormalities identified to suggest metastatic disease.    07/12/2015 - 07/26/2015 Radiation Therapy    Left femur, 30 Gy in 10 fractions by Dr. Sondra Come     07/14/2015 Initial Diagnosis    [name], right ([place])    07/19/2015 Imaging    Initial PET Scan 1. Severe multifocal osseous metastatic disease, much greater than anticipated based on prior imaging. 2. Multifocal nodal metastases involving the right internal mammary, prevascular and subpectoral lymph nodes. No axillary pulmonary involvement identified. 3. No distant extra osseous metastases.    07/30/2015 - 11/29/2015 Chemotherapy    Docetaxel, Herceptin and Perjeta every 3 weeks X6 cycles, pt tolerated moderately well, restaging scan showed excellent response     09/18/2015 Imaging    CT Head w/wo Contrast 1. No acute intracranial abnormality or significant interval change. 2. Stable minimal periventricular white matter hypoattenuation on the right. This may reflect a remote ischemic injury. 3. No evidence for metastatic disease to the brain. 4. No focal soft tissue lesion to explain the patient's tenderness.    10/30/2015 Imaging    CT Abdomen Pelvis w/ Contrast 1. Few mildly prominent fluid-filled loops of small bowel scattered throughout the abdomen, with intraluminal fluid density within the distal colon. Given the provided history, findings are  suggestive of acute enteritis/diarrheal illness. 2. No other acute intra-abdominal or pelvic process identified. 3. Widespread osseous metastatic  disease, better evaluated on most recent PET-CT from 07/19/2015. No pathologic fracture or other complication. 4. 11 mm hypodensity within the left kidney. This lesion measures intermediate density, and is indeterminate. While this lesion is similar in size relative to recent studies, this is increased in size relative to prior study from 2012. Further evaluation with dedicated renal mass protocol CT and/or MRI is recommended for complete characterization.    12/2015 - 05/14/2016 Chemotherapy    Maintenance Herceptin and pejeta every 3 weeks stopped on 05/14/16 due to disease progression    12/25/2015 Imaging    Restaging PET Scan 1. Markedly improved skeletal activity, with only several faint foci of residual accentuated metabolic activity, but resolution of the vast majority of the previously extensive osseous metastatic disease. 2. Resolution of the prior right internal mammary and right prevascular lymph nodes. 3. Residual subcutaneous edema and edema along fascia planes in the left upper thigh. This remains somewhat more than I would expect for placement of an IM nail 6 weeks ago. If there is leg swelling further down, consider Doppler venous ultrasound to rule out left lower extremity DVT. I do not perceive an obvious difference in density between the pelvic and common femoral veins on the noncontrast CT data. 4. Chronic right maxillary and right sphenoid sinusitis.    01/16/2016 -  Anti-estrogen oral therapy    Letrozole 2.5 mg daily. She was switched to exemestane due to disease progression on 02/08/17.    03/01/2016 Miscellaneous    Patient presented to ED following a fall; she reports she landed on her back and is now experiencing back pain. The patient was evaluated and discharge home same day with pain control.    05/01/2016 Imaging    CT CAP IMPRESSION: 1. Stable exam.  No new or progressive disease identified. 2. No evidence for residual or recurrent adenopathy within the chest. 3.  Stable bone metastasis.    05/01/2016 Imaging    BONE SCAN IMPRESSION: 1. Small focal uptake involving the anterior rib ends of the left second and right fourth ribs, new since the prior bone scan. The location of this uptake is more suggestive of a traumatic or inflammatory etiology as opposed to metastatic disease. No other evidence of metastatic disease. 2. There are other areas of stable uptake which are most likely degenerative/reactive, including right shoulder and cervical spine uptake and uptake along the right femur adjacent to the ORIF hardware.    05/20/2016 Imaging    NM PET Skull to Thigh IMPRESSION: 1. There multiple metastatic foci in the bony pelvis, including some new abnormal foci compare to the prior PET-CT, compatible with mildly progressive bony metastatic disease. Also, a left T11 vertebral hypermetabolic metastatic lesion is observed, new compared to the prior exam. 2. No active extraosseous malignancy is currently identified. 3. Right anterior fourth rib healing fracture, appears be    06/04/2016 - 01/08/2017 Chemotherapy    Second line chemotherapy Kadcyla every 3 weeks started on 06/04/16 and stopped on 01/08/17 due to mixed response.      10/05/2016 Imaging    CT CAP and Bone Scan:  Bones: left intramedullary rod and left femoral head neck screw in place. In the proximal diaphysis of the left femur there is cortical thickening and irregularity. No lytic or blastic bone lesions. No fracture or vertebral endplate destruction.   No definite evidence of recurrent disease in the  chest, abdomen, or pelvis.    10/28/2016 Imaging    CT A/P IMPRESSION: No acute process demonstrated in the abdomen or pelvis. No evidence of bowel obstruction or inflammation.    11/04/2016 Imaging    DG foot complete left: IMPRESSION: No fracture or dislocation.  No soft tissue abnormality    11/23/2016 Imaging    CT RIGHT FEMUR IMPRESSION: Negative CT scan of the right  thigh.  No visible metastatic disease.  CT LEFT FEMUR IMPRESSION: 1. Postsurgical changes related to prior cephalomedullary rod fixation of the left femur with linear lucency along the anterolateral proximal femoral cortex at level of the lesser trochanter, suspicious for nondisplaced fracture. 2. Slightly permeative appearance of the proximal left femoral cortex at the site of known prior osseous metastases is largely unchanged. 3. Unchanged patchy lucency and sclerosis along the anterior acetabulum, which appears to correspond with an area of faint uptake on the prior PET-CT, suspicious for osseous metastasis. These results will be called to the ordering clinician or representative by the Radiologist Assistant, and communication documented in the PACS or zVision Dashboard.      01/27/2017 Imaging    IMPRESSION: 1. Mixed response to osseous metastasis. Some hypermetabolic lesions are new and increasingly hypermetabolic, while another index lesion is decreasingly hypermetabolic. 2. No evidence of hypermetabolic soft tissue metastasis.     02/08/2017 -  Antibody Plan    -Exemestane 25 mg daily since 02/08/17 -Herceptin every 3 week since 02/08/17 -Oral Lanpantinib 153m and decreased to 5072mdue to diarrhea and depression. Due to disease progression she swithced to Verzenio 15051mn 05/04/17. She did not toelrate well so she was switched to Ibrance 100m24m 05/31/17. Due to her neutropenia, Ibrance dose reduced to 75 mg daily, 3 weeks on, one-week off.     04/30/2017 PET scan    IMPRESSION: 1. Primarily increase in hypermetabolism of osseous metastasis. An isolated right acetabular lesion is decreased in hypermetabolism since the prior exam. 2. No evidence of hypermetabolic soft tissue metastasis. 3. Similar non FDG avid right-sided thyroid nodule, favoring a benign etiology. Recommend attention on follow-up.    08/30/2017 PET scan    08/30/2017 PET Scan  IMPRESSION: 1.  Worsening osseous metastatic disease. 2. Focal hypermetabolism in the central spinal canal the L1 level, similar to the prior exam. Metastatic implant cannot be excluded. 3. Slight enlargement of a right thyroid nodule which now appears more cystic in character. Consider further evaluation with thyroid ultrasound. If patient is clinically hyperthyroid, consider nuclear medicine thyroid uptake and scan    08/30/2017 Progression    08/30/2017 PET Scan  IMPRESSION: 1. Worsening osseous metastatic disease. 2. Focal hypermetabolism in the central spinal canal the L1 level, similar to the prior exam. Metastatic implant cannot be excluded. 3. Slight enlargement of a right thyroid nodule which now appears more cystic in character. Consider further evaluation with thyroid ultrasound. If patient is clinically hyperthyroid, consider nuclear medicine thyroid uptake and scan    09/29/2017 - 10/11/2017 Radiation Therapy    With Dr, KinaSondra Comerted 09/29/2017 Finished 10/11/2017    10/19/2017 -  Chemotherapy    -Exemestane 25mg58mly starting 02/08/17, stopped on 10/20/2017 when she started chemo  -Herceptin every 3 weeks starting 02/08/17, perjeta added on 10/20/2017  -weekly Taxol started on 10/21/2017     10/27/2017 Imaging    10/27/2017 CT AP IMPRESSION: 1. No acute abnormality. No evidence of bowel obstruction or acute bowel inflammation. Normal appendix. 2. Stable patchy sclerotic osseous metastases throughout the  visualized skeleton. No new or progressive metastatic disease in the abdomen or pelvis.    CURRENT THERAPY: Weekly Taxol 80 mg meter squared with Herceptin and Perjeta every 3 weeks.  INTERVAL HISTORY: [name] 52 y.o. female returns for routine follow-up visit accompanied by her husband and interpreter.  She is seen in the infusion area today.  The patient reports that she is feeling well overall with the exception of mild fatigue.  She is able to eat and drink without much  difficulty.  Reports chest discomfort and is having a cardiac catheterization this coming Monday.  Denies fevers and chills.  Denies shortness of breath, cough, hemoptysis.  Denies nausea, vomiting, constipation, diarrhea.  Denies peripheral neuropathy.  The patient is here for evaluation prior to her next dose of weekly Taxol.  MEDICAL HISTORY: Past Medical History:  Diagnosis Date  . Abnormal Pap smear   . Anxiety   . Breast cancer ([place])   . Cancer ([place]) 1999   breast-s/p mastectomy, chemo, rad  . CIN I (cervical intraepithelial neoplasia I) 2003   by colpo  . Congenital deafness   . Deaf   . Depression   . Radiation 07/12/15-07/26/15   left femur 30 Gy  . S/P bilateral oophorectomy     ALLERGIES:  is allergic to chlorhexidine; promethazine hcl; and diphenhydramine hcl.  MEDICATIONS:  Current Outpatient Medications  Medication Sig Dispense Refill  . ALPRAZolam (XANAX) 0.25 MG tablet Take 1 tablet (0.25 mg total) by mouth 2 (two) times daily as needed for anxiety. 30 tablet 0  . divalproex (DEPAKOTE) 250 MG DR tablet Take 1 tablet (250 mg total) by mouth at bedtime. 30 tablet 2  . HYDROcodone-acetaminophen (NORCO) 10-325 MG tablet Take 1 tablet by mouth every 6 (six) hours as needed. 45 tablet 0  . lidocaine-prilocaine (EMLA) cream Apply 1 application topically as needed. Apply to Porta-Cath 1-2 hours prior to access as directed. 30 g 2  . potassium chloride (K-DUR) 10 MEQ tablet Take 1 tablet (10 mEq total) by mouth 2 (two) times daily. 60 tablet 0   No current facility-administered medications for this visit.    Facility-Administered Medications Ordered in Other Visits  Medication Dose Route Frequency Provider Last Rate Last Dose  . heparin lock flush 100 unit/mL  500 Units Intracatheter Once PRN Truitt Merle, MD      . PACLitaxel (TAXOL) 132 mg in sodium chloride 0.9 % 250 mL chemo infusion (</= 12m/m2)  80 mg/m2 (Treatment Plan Recorded) Intravenous Once FTruitt Merle MD      .  sodium chloride 0.9 % 1,000 mL with potassium chloride 10 mEq infusion   Intravenous Continuous LGordy Levan MD   Stopped at 09/10/15 1653  . sodium chloride 0.9 % injection 10 mL  10 mL Intravenous PRN Livesay, Lennis P, MD      . sodium chloride flush (NS) 0.9 % injection 10 mL  10 mL Intracatheter PRN FTruitt Merle MD        SURGICAL HISTORY:  Past Surgical History:  Procedure Laterality Date  . ABDOMINAL HYSTERECTOMY    . INTRAMEDULLARY (IM) NAIL INTERTROCHANTERIC Left 06/26/2015   Procedure: LEFT BIOMET LONG AFFIXS NAIL;  Surgeon: MMarybelle Killings MD;  [facility] [facility]  Service: Orthopedics;  Laterality: Left;  . IR GENERIC HISTORICAL  08/06/2015   IR [place] V[place] ACC[place] LEFT 08/06/2015 GAletta Edouard MD [facility]INTER[facility]  . IR GENERIC HISTORICAL  08/06/2015   IR FLUORO GUIDE CV LINE LEFT 08/06/2015 GAletta Edouard  MD [facility]  . IR GENERIC HISTORICAL  03/05/2016   IR CV LINE INJECTION 03/05/2016 Markus Daft, MD [facility]  . MASTECTOMY     right breast  . MASTECTOMY    . OVARIAN CYST REMOVAL    . RADIOLOGY WITH ANESTHESIA Left 06/25/2015   Procedure: MRI OF LEFT HIP WITH OR WITHOUT CONTRAST;  Surgeon: Medication Radiologist, MD;  Location: [facility];  Service: Radiology;  Laterality: Left;  DR. MCINTYRE/MRI  . TUBAL LIGATION      REVIEW OF SYSTEMS:   Review of Systems  Constitutional: Negative for appetite change, chills, fever and unexpected weight change.  Positive for mild fatigue. HENT:   Negative for mouth sores, nosebleeds, sore throat and trouble swallowing.   Eyes: Negative for eye problems and icterus.  Respiratory: Negative for cough, hemoptysis, shortness of breath and wheezing.   Cardiovascular: Negative for chest pain and leg swelling.  Gastrointestinal: Negative for abdominal pain, constipation, diarrhea, nausea and vomiting.  Genitourinary: Negative for bladder incontinence, difficulty urinating, dysuria, frequency and hematuria.   Musculoskeletal: Negative for back  pain, gait problem, neck pain and neck stiffness.  Skin: Negative for itching and rash.  Neurological: Negative for dizziness, extremity weakness, gait problem, headaches, light-headedness and seizures.  Hematological: Negative for adenopathy. Does not bruise/bleed easily.  Psychiatric/Behavioral: Negative for confusion, depression and sleep disturbance. The patient is not nervous/anxious.     PHYSICAL EXAMINATION:  Vital signs: Temperature 98.0, pulse 70, respirations 16, blood pressure 117/63, O2 sat 9 9% on room air.  ECOG PERFORMANCE STATUS: 1 - Symptomatic but completely ambulatory  Physical Exam  Constitutional: Oriented to person, place, and time and well-developed, well-nourished, and in no distress. No distress.  HENT:  Head: Normocephalic and atraumatic.  Mouth/Throat: Oropharynx is clear and moist. No oropharyngeal exudate.  Eyes: Conjunctivae are normal. Right eye exhibits no discharge. Left eye exhibits no discharge. No scleral icterus.  Neck: Normal range of motion. Neck supple.  Cardiovascular: Normal rate, regular rhythm, normal heart sounds and intact distal pulses.   Pulmonary/Chest: Effort normal and breath sounds normal. No respiratory distress. No wheezes. No rales.  Abdominal: Soft. Bowel sounds are normal. Exhibits no distension and no mass. There is no tenderness.  Musculoskeletal: Normal range of motion. Exhibits no edema.  Lymphadenopathy:    No cervical adenopathy.  Neurological: Alert and oriented to person, place, and time. Exhibits normal muscle tone. Gait normal. Coordination normal.  Skin: Skin is warm and dry. No rash noted. Not diaphoretic. No erythema. No pallor.  Psychiatric: Mood, memory and judgment normal.  Vitals reviewed.  LABORATORY DATA: Lab Results  Component Value Date   WBC 4.0 12/10/2017   HGB 10.0 (L) 12/10/2017   HCT 29.6 (L) 12/10/2017   MCV 93.1 12/10/2017   PLT 143 (L) 12/10/2017      Chemistry      Component Value  Date/Time   NA 140 12/10/2017 0808   NA 141 01/08/2017 0910   K 3.5 12/10/2017 0808   K 3.3 (L) 01/08/2017 0910   CL 108 12/10/2017 0808   CL 106 05/31/2012 1055   CO2 24 12/10/2017 0808   CO2 26 01/08/2017 0910   BUN 13 12/10/2017 0808   BUN 10.1 01/08/2017 0910   CREATININE 0.70 12/10/2017 0808   CREATININE 0.9 01/08/2017 0910      Component Value Date/Time   CALCIUM 8.7 (L) 12/10/2017 0808   CALCIUM 9.2 01/08/2017 0910   ALKPHOS 61 12/10/2017 0808   ALKPHOS 64 01/08/2017 0910  AST 14 (L) 12/10/2017 0808   AST 21 01/08/2017 0910   ALT 17 12/10/2017 0808   ALT 17 01/08/2017 0910   BILITOT 1.8 (H) 12/10/2017 0808   BILITOT 0.95 01/08/2017 0910       RADIOGRAPHIC STUDIES:  No results found.   ASSESSMENT/PLAN:  [name], right [facility]) This is a pleasant 52 year old female with ER positive and HER-2 positive metastatic breast cancer.  She is currently receiving treatment with weekly Taxol and Herceptin and Perjeta every 3 weeks.  Chemo was held last week due to neutropenia.  Counts have improved.  Bilirubin is slightly elevated at 1.8 which is consistent with her baseline.  Recommend for her to proceed with weekly Taxol today as scheduled.  She will follow-up next week for labs and chemotherapy and have a follow-up visit in 2 weeks.  Insomnia Patient reports anxiety.  She uses Xanax on as-needed basis.  This has been effective for her.  A refill of Xanax was provided to the patient today.  All the patient's questions were answered today.  She knows to call the clinic with any questions, problems, or concerns prior to her next visit.  No orders of the defined types were placed in this encounter.    Mikey Bussing, DNP, AGPCNP-BC, AOCNP 12/10/17

## 2017-12-10 NOTE — Progress Notes (Signed)
Per Mikey Bussing, NP ok to tx with bili 1.8.

## 2017-12-11 LAB — CANCER ANTIGEN 27.29: CA 27.29: 45.8 U/mL — ABNORMAL HIGH (ref 0.0–38.6)

## 2017-12-13 ENCOUNTER — Other Ambulatory Visit: Payer: Self-pay

## 2017-12-13 ENCOUNTER — Encounter (HOSPITAL_COMMUNITY): Payer: Self-pay | Admitting: Cardiology

## 2017-12-13 ENCOUNTER — Ambulatory Visit (HOSPITAL_COMMUNITY)
Admission: RE | Admit: 2017-12-13 | Discharge: 2017-12-13 | Disposition: A | Discharge status: Home / Self Care | Payer: Medicaid Other | Source: Ambulatory Visit | Attending: Cardiology | Admitting: Cardiology

## 2017-12-13 ENCOUNTER — Encounter (HOSPITAL_COMMUNITY)
Admission: RE | Disposition: A | Discharge status: Home / Self Care | Payer: Self-pay | Source: Ambulatory Visit | Attending: Cardiology

## 2017-12-13 DIAGNOSIS — Z90722 Acquired absence of ovaries, bilateral: Secondary | ICD-10-CM | POA: Insufficient documentation

## 2017-12-13 DIAGNOSIS — I208 Other forms of angina pectoris: Secondary | ICD-10-CM | POA: Diagnosis not present

## 2017-12-13 DIAGNOSIS — Z9011 Acquired absence of right breast and nipple: Secondary | ICD-10-CM | POA: Insufficient documentation

## 2017-12-13 DIAGNOSIS — Z79899 Other long term (current) drug therapy: Secondary | ICD-10-CM | POA: Diagnosis not present

## 2017-12-13 DIAGNOSIS — H905 Unspecified sensorineural hearing loss: Secondary | ICD-10-CM | POA: Insufficient documentation

## 2017-12-13 DIAGNOSIS — Z8249 Family history of ischemic heart disease and other diseases of the circulatory system: Secondary | ICD-10-CM | POA: Insufficient documentation

## 2017-12-13 DIAGNOSIS — Z9889 Other specified postprocedural states: Secondary | ICD-10-CM | POA: Diagnosis not present

## 2017-12-13 DIAGNOSIS — Z853 Personal history of malignant neoplasm of breast: Secondary | ICD-10-CM | POA: Diagnosis not present

## 2017-12-13 DIAGNOSIS — Z9221 Personal history of antineoplastic chemotherapy: Secondary | ICD-10-CM | POA: Insufficient documentation

## 2017-12-13 DIAGNOSIS — F329 Major depressive disorder, single episode, unspecified: Secondary | ICD-10-CM | POA: Insufficient documentation

## 2017-12-13 DIAGNOSIS — F419 Anxiety disorder, unspecified: Secondary | ICD-10-CM | POA: Insufficient documentation

## 2017-12-13 DIAGNOSIS — Z90710 Acquired absence of both cervix and uterus: Secondary | ICD-10-CM | POA: Insufficient documentation

## 2017-12-13 DIAGNOSIS — R9439 Abnormal result of other cardiovascular function study: Secondary | ICD-10-CM | POA: Diagnosis present

## 2017-12-13 DIAGNOSIS — I25118 Atherosclerotic heart disease of native coronary artery with other forms of angina pectoris: Secondary | ICD-10-CM | POA: Diagnosis not present

## 2017-12-13 DIAGNOSIS — Z888 Allergy status to other drugs, medicaments and biological substances status: Secondary | ICD-10-CM | POA: Insufficient documentation

## 2017-12-13 HISTORY — PX: LEFT HEART CATH AND CORONARY ANGIOGRAPHY: CATH118249

## 2017-12-13 HISTORY — DX: Presence of other vascular implants and grafts: Z95.828

## 2017-12-13 SURGERY — LEFT HEART CATH AND CORONARY ANGIOGRAPHY
Anesthesia: LOCAL

## 2017-12-13 MED ORDER — SODIUM CHLORIDE 0.9% FLUSH: 10.0000 mL | Freq: Two times a day (BID) | INTRAVENOUS | Status: DC

## 2017-12-13 MED ORDER — LIDOCAINE HCL (PF) 1 % IJ SOLN
INTRAMUSCULAR | Status: AC
  Filled 2017-12-13: qty 30

## 2017-12-13 MED ORDER — SODIUM CHLORIDE 0.9 % WEIGHT BASED INFUSION
3.0000 mL/kg/h | INTRAVENOUS | Status: AC
  Administered 2017-12-13: 3 mL/kg/h via INTRAVENOUS

## 2017-12-13 MED ORDER — HEPARIN SOD (PORK) LOCK FLUSH 100 UNIT/ML IV SOLN
500.0000 [IU] | INTRAVENOUS | Status: AC | PRN
  Administered 2017-12-13: 500 [IU]

## 2017-12-13 MED ORDER — LIDOCAINE HCL (PF) 1 % IJ SOLN
INTRAMUSCULAR | Status: DC | PRN
  Administered 2017-12-13: 4 mL

## 2017-12-13 MED ORDER — MIDAZOLAM HCL 2 MG/2ML IJ SOLN
INTRAMUSCULAR | Status: DC | PRN
  Administered 2017-12-13: 2 mg via INTRAVENOUS
  Administered 2017-12-13: 1 mg via INTRAVENOUS

## 2017-12-13 MED ORDER — HEPARIN (PORCINE) IN NACL 1000-0.9 UT/500ML-% IV SOLN
INTRAVENOUS | Status: DC | PRN
  Administered 2017-12-13 (×2): 500 mL

## 2017-12-13 MED ORDER — MIDAZOLAM HCL 2 MG/2ML IJ SOLN
INTRAMUSCULAR | Status: AC
  Filled 2017-12-13: qty 2

## 2017-12-13 MED ORDER — IOHEXOL 350 MG/ML SOLN
INTRAVENOUS | Status: DC | PRN
  Administered 2017-12-13: 30 mL

## 2017-12-13 MED ORDER — HEPARIN (PORCINE) IN NACL 1000-0.9 UT/500ML-% IV SOLN
INTRAVENOUS | Status: AC
  Filled 2017-12-13: qty 1000

## 2017-12-13 MED ORDER — VERAPAMIL HCL 2.5 MG/ML IV SOLN
INTRAVENOUS | Status: AC
  Filled 2017-12-13: qty 2

## 2017-12-13 MED ORDER — VERAPAMIL HCL 2.5 MG/ML IV SOLN
INTRAVENOUS | Status: DC | PRN
  Administered 2017-12-13: 10 mL via INTRA_ARTERIAL

## 2017-12-13 MED ORDER — HEPARIN SODIUM (PORCINE) 1000 UNIT/ML IJ SOLN
INTRAMUSCULAR | Status: AC
  Filled 2017-12-13: qty 1

## 2017-12-13 MED ORDER — SODIUM CHLORIDE 0.9% FLUSH: 3.0000 mL | Freq: Two times a day (BID) | INTRAVENOUS | Status: DC

## 2017-12-13 MED ORDER — ASPIRIN 81 MG PO CHEW
81.0000 mg | CHEWABLE_TABLET | ORAL | Status: AC
  Administered 2017-12-13: 81 mg via ORAL
  Filled 2017-12-13: qty 1

## 2017-12-13 MED ORDER — FENTANYL CITRATE (PF) 100 MCG/2ML IJ SOLN
INTRAMUSCULAR | Status: DC | PRN
  Administered 2017-12-13 (×2): 25 ug via INTRAVENOUS

## 2017-12-13 MED ORDER — FENTANYL CITRATE (PF) 100 MCG/2ML IJ SOLN
INTRAMUSCULAR | Status: AC
  Filled 2017-12-13: qty 2

## 2017-12-13 MED ORDER — HEPARIN SODIUM (PORCINE) 1000 UNIT/ML IJ SOLN
INTRAMUSCULAR | Status: DC | PRN
  Administered 2017-12-13: 4000 [IU] via INTRAVENOUS

## 2017-12-13 MED ORDER — SODIUM CHLORIDE 0.9 % WEIGHT BASED INFUSION: 1.0000 mL/kg/h | INTRAVENOUS | Status: DC

## 2017-12-13 MED ORDER — SODIUM CHLORIDE 0.9% FLUSH: 3.0000 mL | INTRAVENOUS | Status: DC | PRN

## 2017-12-13 MED ORDER — SODIUM CHLORIDE 0.9% FLUSH
10.0000 mL | INTRAVENOUS | Status: DC | PRN
  Administered 2017-12-13: 10 mL
  Filled 2017-12-13: qty 40

## 2017-12-13 MED ORDER — SODIUM CHLORIDE 0.9 % IV SOLN: 250.0000 mL | INTRAVENOUS | Status: DC | PRN

## 2017-12-13 SURGICAL SUPPLY — 12 items
CATH INFINITI 5 FR JL3.5 (CATHETERS) ×2 IMPLANT
CATH OPTITORQUE TIG 4.0 5F (CATHETERS) ×2 IMPLANT
DEVICE RAD COMP TR BAND LRG (VASCULAR PRODUCTS) ×2 IMPLANT
GLIDESHEATH SLEND A-KIT 6F 22G (SHEATH) ×2 IMPLANT
GUIDEWIRE INQWIRE 1.5J.035X260 (WIRE) ×1 IMPLANT
INQWIRE 1.5J .035X260CM (WIRE) ×2
KIT HEART LEFT (KITS) ×2 IMPLANT
PACK CARDIAC CATHETERIZATION (CUSTOM PROCEDURE TRAY) ×2 IMPLANT
SHEATH PROBE COVER 6X72 (BAG) ×2 IMPLANT
TRANSDUCER W/STOPCOCK (MISCELLANEOUS) ×2 IMPLANT
TUBING CIL FLEX 10 FLL-RA (TUBING) ×2 IMPLANT
WIRE HI TORQ VERSACORE-J 145CM (WIRE) ×2 IMPLANT

## 2017-12-13 NOTE — Discharge Instructions (Signed)

## 2017-12-13 NOTE — Progress Notes (Signed)
Patient arrived to room 1 for procedure. The interpreter went with Olive Bass to put on appropriate gear to accompany patient to the lab. While waiting for the interpreter, Verlin Fester, RN was able to have a conversation with the patient and was able to gather that the patient was having 6/10 chest pain, that her husband was in the waiting area, that she had two children, and that she was very scared of the procedure. Rhae Lerner was able to ease her fears slightly. On arrival to room with patient, interpreter expressed that the patient was not at all able to read lips and it appeared to cath lab staff that she was answering questions on behalf of the patient before the patient was able to answer. Reino Bellis, [place] was notified.

## 2017-12-13 NOTE — Interval H&P Note (Signed)
History and Physical Interval Note:  12/13/2017 7:34 AM  [name]  has presented today for surgery, with the diagnosis of cp (atypical angina) - abnormal echo.  The various methods of treatment have been discussed with the patient and family. After consideration of risks, benefits and other options for treatment, the patient has consented to  Procedure(s): LEFT HEART CATH AND CORONARY ANGIOGRAPHY (N/A) with possible PERCUTANEOUS CORONARY INTERVENTION as a surgical intervention .  The patient's history has been reviewed, patient examined, no change in status, stable for surgery.  I have reviewed the patient's chart and labs.  Questions were answered to the patient's satisfaction.    Cath Lab Visit (complete for each Cath Lab visit)  Clinical Evaluation Leading to the Procedure:   ACS: No.  Non-ACS:    Anginal Classification: CCS III  Anti-ischemic medical therapy: No Therapy - NOT STARTED PRE-CATH  Non-Invasive Test Results: High-risk stress test findings: cardiac mortality >3%/year  Prior CABG: No previous CABG   Glenetta Hew

## 2017-12-13 NOTE — Progress Notes (Signed)
Patient has refused 2nd PIV.

## 2017-12-14 ENCOUNTER — Telehealth: Payer: Self-pay | Admitting: Cardiology

## 2017-12-14 NOTE — Telephone Encounter (Signed)
New message   Patient had cardiac cath on yesterday the patient wants to know if she take the bandage off. She states that she was told to leave the bandage on for 24 hours. Please advise.

## 2017-12-14 NOTE — Telephone Encounter (Signed)
Left the patient a detailed message about being able to remove the bandage, per instructions from hospital discharge. Advised to call, if she had further questions.

## 2017-12-17 ENCOUNTER — Encounter: Payer: Self-pay | Admitting: Hematology

## 2017-12-17 ENCOUNTER — Encounter: Payer: Self-pay | Admitting: Cardiology

## 2017-12-17 ENCOUNTER — Inpatient Hospital Stay (HOSPITAL_BASED_OUTPATIENT_CLINIC_OR_DEPARTMENT_OTHER): Payer: Medicaid Other | Admitting: Hematology

## 2017-12-17 ENCOUNTER — Inpatient Hospital Stay: Payer: Medicaid Other

## 2017-12-17 ENCOUNTER — Other Ambulatory Visit: Payer: Self-pay | Admitting: Hematology

## 2017-12-17 ENCOUNTER — Inpatient Hospital Stay: Payer: Medicaid Other | Attending: Hematology

## 2017-12-17 VITALS — BP 116/76 | HR 90 | Temp 97.9°F | Resp 18

## 2017-12-17 DIAGNOSIS — H905 Unspecified sensorineural hearing loss: Secondary | ICD-10-CM | POA: Diagnosis not present

## 2017-12-17 DIAGNOSIS — Z95828 Presence of other vascular implants and grafts: Secondary | ICD-10-CM

## 2017-12-17 DIAGNOSIS — Z170 Estrogen receptor positive status [ER+]: Secondary | ICD-10-CM

## 2017-12-17 DIAGNOSIS — G4700 Insomnia, unspecified: Secondary | ICD-10-CM | POA: Diagnosis not present

## 2017-12-17 DIAGNOSIS — Z923 Personal history of irradiation: Secondary | ICD-10-CM

## 2017-12-17 DIAGNOSIS — Z79811 Long term (current) use of aromatase inhibitors: Secondary | ICD-10-CM

## 2017-12-17 DIAGNOSIS — D6481 Anemia due to antineoplastic chemotherapy: Secondary | ICD-10-CM | POA: Insufficient documentation

## 2017-12-17 DIAGNOSIS — E876 Hypokalemia: Secondary | ICD-10-CM | POA: Insufficient documentation

## 2017-12-17 DIAGNOSIS — Z79899 Other long term (current) drug therapy: Secondary | ICD-10-CM | POA: Insufficient documentation

## 2017-12-17 DIAGNOSIS — J069 Acute upper respiratory infection, unspecified: Secondary | ICD-10-CM | POA: Diagnosis not present

## 2017-12-17 DIAGNOSIS — C7951 Secondary malignant neoplasm of bone: Secondary | ICD-10-CM | POA: Insufficient documentation

## 2017-12-17 DIAGNOSIS — R112 Nausea with vomiting, unspecified: Secondary | ICD-10-CM | POA: Diagnosis not present

## 2017-12-17 DIAGNOSIS — R079 Chest pain, unspecified: Secondary | ICD-10-CM

## 2017-12-17 DIAGNOSIS — C50911 Malignant neoplasm of unspecified site of right female breast: Secondary | ICD-10-CM

## 2017-12-17 DIAGNOSIS — Z5112 Encounter for antineoplastic immunotherapy: Secondary | ICD-10-CM | POA: Insufficient documentation

## 2017-12-17 DIAGNOSIS — F419 Anxiety disorder, unspecified: Secondary | ICD-10-CM | POA: Diagnosis not present

## 2017-12-17 DIAGNOSIS — C50919 Malignant neoplasm of unspecified site of unspecified female breast: Secondary | ICD-10-CM

## 2017-12-17 DIAGNOSIS — C799 Secondary malignant neoplasm of unspecified site: Principal | ICD-10-CM

## 2017-12-17 LAB — CBC WITH DIFFERENTIAL/PLATELET
Abs Immature Granulocytes: 0.55 10*3/uL — ABNORMAL HIGH (ref 0.00–0.07)
Basophils Absolute: 0 10*3/uL (ref 0.0–0.1)
Basophils Relative: 1 %
Eosinophils Absolute: 0.1 10*3/uL (ref 0.0–0.5)
Eosinophils Relative: 1 %
HCT: 30.5 % — ABNORMAL LOW (ref 36.0–46.0)
Hemoglobin: 10.1 g/dL — ABNORMAL LOW (ref 12.0–15.0)
Immature Granulocytes: 9 %
Lymphocytes Relative: 15 %
Lymphs Abs: 0.9 10*3/uL (ref 0.7–4.0)
MCH: 31.2 pg (ref 26.0–34.0)
MCHC: 33.1 g/dL (ref 30.0–36.0)
MCV: 94.1 fL (ref 80.0–100.0)
Monocytes Absolute: 0.3 10*3/uL (ref 0.1–1.0)
Monocytes Relative: 5 %
Neutro Abs: 4.1 10*3/uL (ref 1.7–7.7)
Neutrophils Relative %: 69 %
Platelets: 182 10*3/uL (ref 150–400)
RBC: 3.24 MIL/uL — ABNORMAL LOW (ref 3.87–5.11)
RDW: 13.9 % (ref 11.5–15.5)
WBC: 5.9 10*3/uL (ref 4.0–10.5)
nRBC: 0.5 % — ABNORMAL HIGH (ref 0.0–0.2)

## 2017-12-17 LAB — COMPREHENSIVE METABOLIC PANEL
ALBUMIN: 3.4 g/dL — AB (ref 3.5–5.0)
ALT: 33 U/L (ref 0–44)
AST: 19 U/L (ref 15–41)
Alkaline Phosphatase: 64 U/L (ref 38–126)
Anion gap: 12 (ref 5–15)
BUN: 14 mg/dL (ref 6–20)
CO2: 24 mmol/L (ref 22–32)
Calcium: 9.3 mg/dL (ref 8.9–10.3)
Chloride: 104 mmol/L (ref 98–111)
Creatinine, Ser: 0.83 mg/dL (ref 0.44–1.00)
GFR calc Af Amer: >60 mL/min (ref >60)
GFR calc non Af Amer: >60 mL/min (ref >60)
GLUCOSE: 85 mg/dL (ref 70–99)
Potassium: 3.6 mmol/L (ref 3.5–5.1)
Sodium: 140 mmol/L (ref 135–145)
Total Bilirubin: 1.7 mg/dL — ABNORMAL HIGH (ref 0.3–1.2)
Total Protein: 6.3 g/dL — ABNORMAL LOW (ref 6.5–8.1)

## 2017-12-17 MED ORDER — SODIUM CHLORIDE 0.9 % IV SOLN
80.0000 mg/m2 | Freq: Once | INTRAVENOUS | Status: AC
  Administered 2017-12-17: 132 mg via INTRAVENOUS
  Filled 2017-12-17: qty 22

## 2017-12-17 MED ORDER — HEPARIN SOD (PORK) LOCK FLUSH 100 UNIT/ML IV SOLN
500.0000 [IU] | Freq: Once | INTRAVENOUS | Status: AC | PRN
  Administered 2017-12-17: 500 [IU]
  Filled 2017-12-17: qty 5

## 2017-12-17 MED ORDER — PANTOPRAZOLE SODIUM 20 MG PO TBEC: 20.0000 mg | DELAYED_RELEASE_TABLET | Freq: Every day | ORAL | 0 refills | Status: DC

## 2017-12-17 MED ORDER — CYCLOBENZAPRINE HCL 5 MG PO TABS: 5.0000 mg | ORAL_TABLET | Freq: Three times a day (TID) | ORAL | 0 refills | Status: DC | PRN

## 2017-12-17 MED ORDER — SODIUM CHLORIDE 0.9% FLUSH
10.0000 mL | Freq: Once | INTRAVENOUS | Status: AC
  Administered 2017-12-17: 10 mL
  Filled 2017-12-17: qty 10

## 2017-12-17 MED ORDER — FAMOTIDINE IN NACL 20-0.9 MG/50ML-% IV SOLN
INTRAVENOUS | Status: AC
  Filled 2017-12-17: qty 50

## 2017-12-17 MED ORDER — DEXAMETHASONE SODIUM PHOSPHATE 10 MG/ML IJ SOLN
INTRAMUSCULAR | Status: AC
  Filled 2017-12-17: qty 1

## 2017-12-17 MED ORDER — SODIUM CHLORIDE 0.9% FLUSH
10.0000 mL | INTRAVENOUS | Status: DC | PRN
  Administered 2017-12-17: 10 mL
  Filled 2017-12-17: qty 10

## 2017-12-17 MED ORDER — SODIUM CHLORIDE 0.9 % IV SOLN
Freq: Once | INTRAVENOUS | Status: AC
  Administered 2017-12-17: 10:00:00 via INTRAVENOUS
  Filled 2017-12-17: qty 250

## 2017-12-17 MED ORDER — FAMOTIDINE IN NACL 20-0.9 MG/50ML-% IV SOLN
20.0000 mg | Freq: Once | INTRAVENOUS | Status: AC
  Administered 2017-12-17: 20 mg via INTRAVENOUS

## 2017-12-17 MED ORDER — DEXAMETHASONE SODIUM PHOSPHATE 10 MG/ML IJ SOLN
10.0000 mg | Freq: Once | INTRAMUSCULAR | Status: AC
  Administered 2017-12-17: 10 mg via INTRAVENOUS

## 2017-12-17 NOTE — Progress Notes (Signed)
Left message for Dr. Fransico Him to call Dr. Burr Medico on her cell phone

## 2017-12-17 NOTE — Telephone Encounter (Signed)
error 

## 2017-12-17 NOTE — Patient Instructions (Signed)
[facility] Discharge Instructions for Patients Receiving Chemotherapy  Today you received the following chemotherapy agents:  Taxol.  To help prevent nausea and vomiting after your treatment, we encourage you to take your nausea medication as directed.   If you develop nausea and vomiting that is not controlled by your nausea medication, call the clinic.   BELOW ARE SYMPTOMS THAT SHOULD BE REPORTED IMMEDIATELY:  *FEVER GREATER THAN 100.5 F  *CHILLS WITH OR WITHOUT FEVER  NAUSEA AND VOMITING THAT IS NOT CONTROLLED WITH YOUR NAUSEA MEDICATION  *UNUSUAL SHORTNESS OF BREATH  *UNUSUAL BRUISING OR BLEEDING  TENDERNESS IN MOUTH AND THROAT WITH OR WITHOUT PRESENCE OF ULCERS  *URINARY PROBLEMS  *BOWEL PROBLEMS  UNUSUAL RASH Items with * indicate a potential emergency and should be followed up as soon as possible.  Feel free to call the clinic should you have any questions or concerns. The clinic phone number is [phone].  Please show the CHEMO ALERT CARD at check-in to the Emergency Department and triage nurse.   

## 2017-12-17 NOTE — Progress Notes (Signed)
[place]  [phone] [phone]  Clinic Follow up Note   Patient Care Team: [name], [name], [name] as PCP - General (Physician Assistant) Sueanne Margarita, MD as PCP - [facility] (Cardiology) [name], MD as Consulting Physician (Orthopedic Surgery) 12/17/2017  CHIEF COMPLAINS: chest pain   SUMMARY OF ONCOLOGIC HISTORY: Oncology History    Cancer Staging No matching staging information was found for the patient.       [name], right ([place])   11/1997 Cancer Diagnosis    Patient had 1.4 cm poorly differentiated right breast carcinoma diagnosed in Nov 1999 at age 73, 1/7 axillary nodes involved, ER/PR and HER 2 positive. She had mastectomy with the 7 axillary node evaluation, 4 cycles of adriamycin/ cytoxan followed by taxotere, then five years of tamoxifen thru June 2005 (no herceptin in 1999). She had bilateral oophorectomy in May 2005. She was briefly on aromatase inhibitor after tamoxifen, but discontinued this herself due to poor tolerance.    04/04/2015 Genetic Testing    Negative genetic testing on the breast/ovarian cancer pnael.  The Breast/Ovarian gene panel offered by [facility] includes sequencing and rearrangement analysis for the following 20 genes:  ATM, BARD1, BRCA1, BRCA2, BRIP1, CDH1, CHEK2, EPCAM, FANCC, MLH1, MSH2, MSH6, NBN, PALB2, PMS2, PTEN, RAD51C, RAD51D, TP53, and XRCC2.    06/26/2015 Pathology Results    Initial Path Report Bone, curettage, Left femur met, intramedullary subtroch tissue - METASTATIC ADENOCARCINOMA.  Estrogen Receptor: 95%, POSITIVE, STRONG STAINING INTENSITY Progesterone Receptor: 2%, POSITIVE, STRONG  HER2 (+), IHC 3+    06/26/2015 Surgery    Biopsy of metastatic tissue and stabilization with Affixus trochanteric nail, proximal and distal interlock of left femur by Dr. [name]     06/28/2015 Imaging    CT Chest w/ Contrast 1. Extensive right internal mammary  chain lymphadenopathy consistent with metastatic breast cancer. 2. Lytic metastases involving the posterior elements at T1 with probable nondisplaced pathologic fracture of the spinous process. 3. No other evidence of thoracic metastatic disease. 4. Trace bilateral pleural effusions with associated bibasilar atelectasis. 5. Indeterminate right thyroid nodule, not previously imaged. This could be evaluated with thyroid ultrasound as clinically warranted.    07/01/2015 Imaging    Bone Scan 1. Increased activity noted throughout the left femur. Although metastatic disease cannot be excluded, these changes are most likely secondary to prior surgery. 2. No other focal abnormalities identified to suggest metastatic disease.    07/12/2015 - 07/26/2015 Radiation Therapy    Left femur, 30 Gy in 10 fractions by Dr. Sondra Come     07/14/2015 Initial Diagnosis    [name], right ([place])    07/19/2015 Imaging    Initial PET Scan 1. Severe multifocal osseous metastatic disease, much greater than anticipated based on prior imaging. 2. Multifocal nodal metastases involving the right internal mammary, prevascular and subpectoral lymph nodes. No axillary pulmonary involvement identified. 3. No distant extra osseous metastases.    07/30/2015 - 11/29/2015 Chemotherapy    Docetaxel, Herceptin and Perjeta every 3 weeks X6 cycles, pt tolerated moderately well, restaging scan showed excellent response     09/18/2015 Imaging    CT Head w/wo Contrast 1. No acute intracranial abnormality or significant interval change. 2. Stable minimal periventricular white matter hypoattenuation on the right. This may reflect a remote ischemic injury. 3. No evidence for metastatic disease to the brain. 4. No focal soft tissue lesion to explain the patient's tenderness.  10/30/2015 Imaging    CT Abdomen Pelvis w/ Contrast 1. Few mildly prominent fluid-filled loops of small bowel scattered throughout the  abdomen, with intraluminal fluid density within the distal colon. Given the provided history, findings are suggestive of acute enteritis/diarrheal illness. 2. No other acute intra-abdominal or pelvic process identified. 3. Widespread osseous metastatic disease, better evaluated on most recent PET-CT from 07/19/2015. No pathologic fracture or other complication. 4. 11 mm hypodensity within the left kidney. This lesion measures intermediate density, and is indeterminate. While this lesion is similar in size relative to recent studies, this is increased in size relative to prior study from 2012. Further evaluation with dedicated renal mass protocol CT and/or MRI is recommended for complete characterization.    12/2015 - 05/14/2016 Chemotherapy    Maintenance Herceptin and pejeta every 3 weeks stopped on 05/14/16 due to disease progression    12/25/2015 Imaging    Restaging PET Scan 1. Markedly improved skeletal activity, with only several faint foci of residual accentuated metabolic activity, but resolution of the vast majority of the previously extensive osseous metastatic disease. 2. Resolution of the prior right internal mammary and right prevascular lymph nodes. 3. Residual subcutaneous edema and edema along fascia planes in the left upper thigh. This remains somewhat more than I would expect for placement of an IM nail 6 weeks ago. If there is leg swelling further down, consider Doppler venous ultrasound to rule out left lower extremity DVT. I do not perceive an obvious difference in density between the pelvic and common femoral veins on the noncontrast CT data. 4. Chronic right maxillary and right sphenoid sinusitis.    01/16/2016 -  Anti-estrogen oral therapy    Letrozole 2.5 mg daily. She was switched to exemestane due to disease progression on 02/08/17.    03/01/2016 Miscellaneous    Patient presented to ED following a fall; she reports she landed on her back and is now experiencing back pain. The  patient was evaluated and discharge home same day with pain control.    05/01/2016 Imaging    CT CAP IMPRESSION: 1. Stable exam.  No new or progressive disease identified. 2. No evidence for residual or recurrent adenopathy within the chest. 3. Stable bone metastasis.    05/01/2016 Imaging    BONE SCAN IMPRESSION: 1. Small focal uptake involving the anterior rib ends of the left second and right fourth ribs, new since the prior bone scan. The location of this uptake is more suggestive of a traumatic or inflammatory etiology as opposed to metastatic disease. No other evidence of metastatic disease. 2. There are other areas of stable uptake which are most likely degenerative/reactive, including right shoulder and cervical spine uptake and uptake along the right femur adjacent to the ORIF hardware.    05/20/2016 Imaging    NM PET Skull to Thigh IMPRESSION: 1. There multiple metastatic foci in the bony pelvis, including some new abnormal foci compare to the prior PET-CT, compatible with mildly progressive bony metastatic disease. Also, a left T11 vertebral hypermetabolic metastatic lesion is observed, new compared to the prior exam. 2. No active extraosseous malignancy is currently identified. 3. Right anterior fourth rib healing fracture, appears be    06/04/2016 - 01/08/2017 Chemotherapy    Second line chemotherapy Kadcyla every 3 weeks started on 06/04/16 and stopped on 01/08/17 due to mixed response.      10/05/2016 Imaging    CT CAP and Bone Scan:  Bones: left intramedullary rod and left femoral head neck screw in  place. In the proximal diaphysis of the left femur there is cortical thickening and irregularity. No lytic or blastic bone lesions. No fracture or vertebral endplate destruction.   No definite evidence of recurrent disease in the chest, abdomen, or pelvis.    10/28/2016 Imaging    CT A/P IMPRESSION: No acute process demonstrated in the abdomen or pelvis. No  evidence of bowel obstruction or inflammation.    11/04/2016 Imaging    DG foot complete left: IMPRESSION: No fracture or dislocation.  No soft tissue abnormality    11/23/2016 Imaging    CT RIGHT FEMUR IMPRESSION: Negative CT scan of the right thigh.  No visible metastatic disease.  CT LEFT FEMUR IMPRESSION: 1. Postsurgical changes related to prior cephalomedullary rod fixation of the left femur with linear lucency along the anterolateral proximal femoral cortex at level of the lesser trochanter, suspicious for nondisplaced fracture. 2. Slightly permeative appearance of the proximal left femoral cortex at the site of known prior osseous metastases is largely unchanged. 3. Unchanged patchy lucency and sclerosis along the anterior acetabulum, which appears to correspond with an area of faint uptake on the prior PET-CT, suspicious for osseous metastasis. These results will be called to the ordering clinician or representative by the Radiologist Assistant, and communication documented in the PACS or zVision Dashboard.      01/27/2017 Imaging    IMPRESSION: 1. Mixed response to osseous metastasis. Some hypermetabolic lesions are new and increasingly hypermetabolic, while another index lesion is decreasingly hypermetabolic. 2. No evidence of hypermetabolic soft tissue metastasis.     02/08/2017 -  Antibody Plan    -Exemestane 25 mg daily since 02/08/17 -Herceptin every 3 week since 02/08/17 -Oral Lanpantinib 1572m and decreased to 5080mdue to diarrhea and depression. Due to disease progression she swithced to Verzenio 15036mn 05/04/17. She did not toelrate well so she was switched to Ibrance 100m59m 05/31/17. Due to her neutropenia, Ibrance dose reduced to 75 mg daily, 3 weeks on, one-week off.     04/30/2017 PET scan    IMPRESSION: 1. Primarily increase in hypermetabolism of osseous metastasis. An isolated right acetabular lesion is decreased in hypermetabolism since the  prior exam. 2. No evidence of hypermetabolic soft tissue metastasis. 3. Similar non FDG avid right-sided thyroid nodule, favoring a benign etiology. Recommend attention on follow-up.    08/30/2017 PET scan    08/30/2017 PET Scan  IMPRESSION: 1. Worsening osseous metastatic disease. 2. Focal hypermetabolism in the central spinal canal the L1 level, similar to the prior exam. Metastatic implant cannot be excluded. 3. Slight enlargement of a right thyroid nodule which now appears more cystic in character. Consider further evaluation with thyroid ultrasound. If patient is clinically hyperthyroid, consider nuclear medicine thyroid uptake and scan    08/30/2017 Progression    08/30/2017 PET Scan  IMPRESSION: 1. Worsening osseous metastatic disease. 2. Focal hypermetabolism in the central spinal canal the L1 level, similar to the prior exam. Metastatic implant cannot be excluded. 3. Slight enlargement of a right thyroid nodule which now appears more cystic in character. Consider further evaluation with thyroid ultrasound. If patient is clinically hyperthyroid, consider nuclear medicine thyroid uptake and scan    09/29/2017 - 10/11/2017 Radiation Therapy    With Dr, KinaSondra Comerted 09/29/2017 Finished 10/11/2017    10/19/2017 -  Chemotherapy    -Exemestane 25mg28mly starting 02/08/17, stopped on 10/20/2017 when she started chemo  -Herceptin every 3 weeks starting 02/08/17, perjeta added on 10/20/2017  -weekly Taxol started on  10/21/2017     10/27/2017 Imaging    10/27/2017 CT AP IMPRESSION: 1. No acute abnormality. No evidence of bowel obstruction or acute bowel inflammation. Normal appendix. 2. Stable patchy sclerotic osseous metastases throughout the visualized skeleton. No new or progressive metastatic disease in the abdomen or pelvis.    CURRENT THERAPY: Weekly Taxol, Herceptin and Perjeta every 3 weeks  INTERVAL HISTORY: Hetty comes in today for chemotherapy.  She complains of chest  pain and request to be seen in the infusion room.  Her chest pain has been ongoing for the past 3 to 4 weeks, intermittent, located in the sternal area, especially in the lower sternum, she described as throbbing pain, 7-9/10, lasts for hours, happens with rest and exertion.  She was seen by cardiologist Dr. Radford Pax, underwent stress echo which was positive, so subsequently she underwent cardiac catheterization earlier this week, which was negative per patient.  She states the pain occasion does not help much with her chest pain, she denies heartburn.  She denies any other new pain.  She has been tolerating chemotherapy moderately well, main complaint is moderate fatigue, she is not able to do much housework as before, some taste change in the low appetite, but she is eating and her weight is stable.  No fever or other new complaints.   REVIEW OF SYSTEMS:   Constitutional: Denies fevers, chills or abnormal weight loss, (+) fatigue  Eyes: Denies blurriness of vision Ears, nose, mouth, throat, and face: Denies mucositis or sore throat Respiratory: Denies cough, dyspnea or wheezes Cardiovascular: Denies palpitation, or lower extremity swelling, (+) chest pain  Gastrointestinal:  Denies nausea, heartburn or change in bowel habits Skin: Denies abnormal skin rashes Lymphatics: Denies new lymphadenopathy or easy bruising Neurological:Denies numbness, tingling or new weaknesses Behavioral/Psych: Mood is stable, no new changes  All other systems were reviewed with the patient and are negative.  MEDICAL HISTORY:  Past Medical History:  Diagnosis Date  . Abnormal Pap smear   . Anxiety   . Breast cancer ([place])   . Cancer ([place]) 1999   breast-s/p mastectomy, chemo, rad  . CIN I (cervical intraepithelial neoplasia I) 2003   by colpo  . Congenital deafness   . Deaf   . Depression   . Port-A-Cath in place 2017   for chemotherapy  . Radiation 07/12/15-07/26/15   left femur 30 Gy  . S/P bilateral  oophorectomy     SURGICAL HISTORY: Past Surgical History:  Procedure Laterality Date  . ABDOMINAL HYSTERECTOMY    . INTRAMEDULLARY (IM) NAIL INTERTROCHANTERIC Left 06/26/2015   Procedure: LEFT BIOMET LONG AFFIXS NAIL;  Surgeon: [name], MD;  Location: [facility];  Service: Orthopedics;  Laterality: Left;  . IR GENERIC HISTORICAL  08/06/2015   IR US GUIDE VASC ACCESS LEFT 08/06/2015 Aletta Edouard, MD [facility]  . IR GENERIC HISTORICAL  08/06/2015   IR FLUORO GUIDE CV LINE LEFT 08/06/2015 Aletta Edouard, MD [facility]  . IR GENERIC HISTORICAL  03/05/2016   IR CV LINE INJECTION 03/05/2016 Markus Daft, MD [facility]  . LEFT HEART CATH AND CORONARY ANGIOGRAPHY N/A 12/13/2017   Procedure: LEFT HEART CATH AND CORONARY ANGIOGRAPHY;  Surgeon: Leonie Man, MD;  Location: [facility];  Service: Cardiovascular;  Laterality: N/A;  . MASTECTOMY     right breast  . MASTECTOMY    . OVARIAN CYST REMOVAL    . RADIOLOGY WITH ANESTHESIA Left 06/25/2015   Procedure: MRI OF LEFT HIP WITH OR WITHOUT CONTRAST;  Surgeon: Medication Radiologist, MD;  Location: [facility];  Service: Radiology;  Laterality: Left;  DR. MCINTYRE/MRI  . TUBAL LIGATION      I have reviewed the social history and family history with the patient and they are unchanged from previous note.  ALLERGIES:  is allergic to chlorhexidine; promethazine hcl; and diphenhydramine hcl.  MEDICATIONS:  Current Outpatient Medications  Medication Sig Dispense Refill  . ALPRAZolam (XANAX) 0.25 MG tablet Take 1 tablet (0.25 mg total) by mouth 2 (two) times daily as needed for anxiety. 30 tablet 0  . cyclobenzaprine (FLEXERIL) 5 MG tablet Take 1 tablet (5 mg total) by mouth 3 (three) times daily as needed for muscle spasms. 30 tablet 0  . divalproex (DEPAKOTE) 250 MG DR tablet Take 1 tablet (250 mg total) by mouth at bedtime. 30 tablet 2  . HYDROcodone-acetaminophen (NORCO) 10-325 MG tablet Take 1 tablet by mouth every 6 (six) hours as needed.  (Patient taking differently: Take 1 tablet by mouth every 6 (six) hours as needed (pain). ) 45 tablet 0  . lidocaine-prilocaine (EMLA) cream Apply 1 application topically as needed. Apply to Porta-Cath 1-2 hours prior to access as directed. 30 g 2  . Multiple Vitamin (MULTIVITAMIN WITH MINERALS) TABS tablet Take 1 tablet by mouth daily.    . pantoprazole (PROTONIX) 20 MG tablet Take 1 tablet (20 mg total) by mouth daily. 60 tablet 0  . potassium chloride (K-DUR) 10 MEQ tablet Take 1 tablet (10 mEq total) by mouth 2 (two) times daily. 60 tablet 0   No current facility-administered medications for this visit.    Facility-Administered Medications Ordered in Other Visits  Medication Dose Route Frequency Provider Last Rate Last Dose  . heparin lock flush 100 unit/mL  500 Units Intracatheter Once PRN Truitt Merle, MD      . PACLitaxel (TAXOL) 132 mg in sodium chloride 0.9 % 250 mL chemo infusion (</= 71m/m2)  80 mg/m2 (Treatment Plan Recorded) Intravenous Once FTruitt Merle MD 272 mL/hr at 12/17/17 1040 132 mg at 12/17/17 1040  . sodium chloride 0.9 % 1,000 mL with potassium chloride 10 mEq infusion   Intravenous Continuous LGordy Levan MD   Stopped at 09/10/15 1653  . sodium chloride 0.9 % injection 10 mL  10 mL Intravenous PRN Livesay, Lennis P, MD      . sodium chloride flush (NS) 0.9 % injection 10 mL  10 mL Intracatheter PRN FTruitt Merle MD        PHYSICAL EXAMINATION: ECOG PERFORMANCE STATUS: 2 - Symptomatic, <50% confined to bed Blood pressure 116/76, heart rate 90, respirate 18, temperature 36.6, pulse ox 98% on RA GENERAL:alert, no distress and comfortable SKIN: skin color, texture, turgor are normal, no rashes or significant lesions EYES: normal, Conjunctiva are pink and non-injected, sclera clear OROPHARYNX:no exudate, no erythema and lips, buccal mucosa, and tongue normal  NECK: supple, thyroid normal size, non-tender, without nodularity LYMPH:  no palpable lymphadenopathy in the  cervical, axillary or inguinal LUNGS: clear to auscultation and percussion with normal breathing effort HEART: regular rate & rhythm and no murmurs and no lower extremity edema ABDOMEN:abdomen soft, non-tender and normal bowel sounds Musculoskeletal:no cyanosis of digits and no clubbing, (+) tenderness in mid and low sternum  NEURO: alert & oriented x 3 with fluent speech, no focal motor/sensory deficits  LABORATORY DATA:  I have reviewed the data as listed CBC Latest Ref Rng & Units 12/17/2017 12/10/2017 11/24/2017  WBC 4.0 - 10.5 K/uL 5.9 4.0 1.9(L)  Hemoglobin 12.0 -  15.0 g/dL 10.1(L) 10.0(L) 10.9(L)  Hematocrit 36.0 - 46.0 % 30.5(L) 29.6(L) 32.2(L)  Platelets 150 - 400 K/uL 182 143(L) 217     CMP Latest Ref Rng & Units 12/17/2017 12/10/2017 11/24/2017  Glucose 70 - 99 mg/dL 85 89 88  BUN 6 - 20 mg/dL _0 Creatinine 0.44 - 1.00 mg/dL 0.83 0.70 0.88  Sodium 135 - 145 mmol/L 140 140 139  Potassium 3.5 - 5.1 mmol/L 3.6 3.5 3.9  Chloride 98 - 111 mmol/L 104 108 106  CO2 22 - 32 mmol/L _1 Calcium 8.9 - 10.3 mg/dL 9.3 8.7(L) 9.3  Total Protein 6.5 - 8.1 g/dL 6.3(L) 6.1(L) 6.5  Total Bilirubin 0.3 - 1.2 mg/dL 1.7(H) 1.8(H) 2.0(H)  Alkaline Phos 38 - 126 U/L 64 61 64  AST 15 - 41 U/L 19 14(L) 14(L)  ALT 0 - 44 U/L 33 17 19      RADIOGRAPHIC STUDIES: I have personally reviewed the radiological images as listed and agreed with the findings in the report. No results found.   ASSESSMENT & PLAN:  Mrs. Krogh is a 52 y.o. postmenopause female with:  1. Chest pain -she has been evaluated by cardiologist Dr. Radford Pax, stress was positive, she underwent cardiac catheterization earlier this week, and it was normal.  I have placed a call to Dr. Radford Pax, waiting her to call me back. -Discussed the other possible etiology of her chest pain, such as GERD, sternal pain from bone metastasis (she did not have on previous scans), or muscular pain.  I recommend her to try PPI,  relaxant.  I called in Protonix and Flexeril today -she has no hypoxia, or dyspnea, no leg edema, unlikely PE -I plan to obtain a PET scan in the next 1-2 weeks for further evaluation   2. Metastatic  right breast cancer to bone, ER+ HER2+ -She had excellent response to initial docetaxel, Herceptin and Perjeta, progressed on maintenance Herceptin, Perjeta and letrozole, has had multiple line therapy of Her2 antibodies and endocrine therapy. She is currently on weekly Taxol, Herceptin and Perjeta, tolerating moderately well, with fatigue, decreased appetite, taste change, and hair loss. -Due to her recent significant mid chest pain, I will obtain a PET scan to evaluate her disease status  3.  Congenital deafness  4.  Mild anemia, secondary to chemotherapy and malignancy -stable   5.  Hypokalemia -He has intermittent hyperkalemia, on oral supplement  -K has been normal lately    Plan -Lab reviewed, adequate for treatment, will proceed Taxol today -I called in Protonix and Flexeril for her today -PET in the next 1 to 2 weeks -Follow-up next week   All questions were answered. The patient knows to call the clinic with any problems, questions or concerns. No barriers to learning was detected. I spent 20 minutes counseling the patient face to face. The total time spent in the appointment was 25 minutes and more than 50% was on counseling and review of test results     Truitt Merle, MD 12/17/17

## 2017-12-18 ENCOUNTER — Encounter: Payer: Self-pay | Admitting: Hematology

## 2017-12-18 DIAGNOSIS — R079 Chest pain, unspecified: Secondary | ICD-10-CM | POA: Insufficient documentation

## 2017-12-20 ENCOUNTER — Telehealth: Payer: Self-pay

## 2017-12-20 ENCOUNTER — Telehealth: Payer: Self-pay | Admitting: Hematology

## 2017-12-20 DIAGNOSIS — R079 Chest pain, unspecified: Secondary | ICD-10-CM

## 2017-12-20 NOTE — Telephone Encounter (Signed)
No los per 12/06

## 2017-12-20 NOTE — Telephone Encounter (Signed)
Left the patient a voicemail to call back.  

## 2017-12-20 NOTE — Telephone Encounter (Signed)
-----   Message from Sueanne Margarita, MD sent at 12/20/2017  9:54 AM EST ----- [name], Please order a 2D echo to assess for pericardial effusion and LVF.  Dx is CP.  Traci ----- Message ----- From: Truitt Merle, MD Sent: 12/20/2017   6:57 AM EST To: Sueanne Margarita, MD  Dr. Radford Pax,   Thanks for seeing her. Was her cardiac cath OK last week? I could not find the result report in Epic, pt told me it was OK.  She still has severe chest pain, some are exertional. I have , ordered a PET scan to rule out bone mets at sternum. If negative, do you think it could be coronary artery spasm? She is on taxol, herceptin and perjeta, which can cause heart failure, and cardiac toxicities, but not particular artery spasm.  Thanks  Krista Blue

## 2017-12-22 NOTE — Progress Notes (Signed)
[name]   [phone] [phone]   Clinic Follow up Note   Patient Care Team: [name], [name], [name] as PCP - General (Physician Assistant) Sueanne Margarita, MD as PCP - [facility] (Cardiology) [name], MD as Consulting Physician (Orthopedic Surgery)  Date of Service:  12/24/2017  CHIEF COMPLAINT: Follow-up breast cancer  SUMMARY OF ONCOLOGIC HISTORY: Oncology History    Cancer Staging No matching staging information was found for the patient.       [name], right ([place])   11/1997 Cancer Diagnosis    Patient had 1.4 cm poorly differentiated right breast carcinoma diagnosed in Nov 1999 at age 29, 1/7 axillary nodes involved, ER/PR and HER 2 positive. She had mastectomy with the 7 axillary node evaluation, 4 cycles of adriamycin/ cytoxan followed by taxotere, then five years of tamoxifen thru June 2005 (no herceptin in 1999). She had bilateral oophorectomy in May 2005. She was briefly on aromatase inhibitor after tamoxifen, but discontinued this herself due to poor tolerance.    04/04/2015 Genetic Testing    Negative genetic testing on the breast/ovarian cancer pnael.  The Breast/Ovarian gene panel offered by [facility] includes sequencing and rearrangement analysis for the following 20 genes:  ATM, BARD1, BRCA1, BRCA2, BRIP1, CDH1, CHEK2, EPCAM, FANCC, MLH1, MSH2, MSH6, NBN, PALB2, PMS2, PTEN, RAD51C, RAD51D, TP53, and XRCC2.    06/26/2015 Pathology Results    Initial Path Report Bone, curettage, Left femur met, intramedullary subtroch tissue - METASTATIC ADENOCARCINOMA.  Estrogen Receptor: 95%, POSITIVE, STRONG STAINING INTENSITY Progesterone Receptor: 2%, POSITIVE, STRONG  HER2 (+), IHC 3+    06/26/2015 Surgery    Biopsy of metastatic tissue and stabilization with Affixus trochanteric nail, proximal and distal interlock of left femur by Dr. [name]     06/28/2015 Imaging    CT Chest w/ Contrast 1.  Extensive right internal mammary chain lymphadenopathy consistent with metastatic breast cancer. 2. Lytic metastases involving the posterior elements at T1 with probable nondisplaced pathologic fracture of the spinous process. 3. No other evidence of thoracic metastatic disease. 4. Trace bilateral pleural effusions with associated bibasilar atelectasis. 5. Indeterminate right thyroid nodule, not previously imaged. This could be evaluated with thyroid ultrasound as clinically warranted.    07/01/2015 Imaging    Bone Scan 1. Increased activity noted throughout the left femur. Although metastatic disease cannot be excluded, these changes are most likely secondary to prior surgery. 2. No other focal abnormalities identified to suggest metastatic disease.    07/12/2015 - 07/26/2015 Radiation Therapy    Left femur, 30 Gy in 10 fractions by Dr. Sondra Come     07/14/2015 Initial Diagnosis    [name], right ([place])    07/19/2015 Imaging    Initial PET Scan 1. Severe multifocal osseous metastatic disease, much greater than anticipated based on prior imaging. 2. Multifocal nodal metastases involving the right internal mammary, prevascular and subpectoral lymph nodes. No axillary pulmonary involvement identified. 3. No distant extra osseous metastases.    07/30/2015 - 11/29/2015 Chemotherapy    Docetaxel, Herceptin and Perjeta every 3 weeks X6 cycles, pt tolerated moderately well, restaging scan showed excellent response     09/18/2015 Imaging    CT Head w/wo Contrast 1. No acute intracranial abnormality or significant interval change. 2. Stable minimal periventricular white matter hypoattenuation on the right. This may reflect a remote ischemic injury. 3. No evidence for metastatic disease to the brain. 4. No focal soft tissue  lesion to explain the patient's tenderness.    10/30/2015 Imaging    CT Abdomen Pelvis w/ Contrast 1. Few mildly prominent fluid-filled loops of small  bowel scattered throughout the abdomen, with intraluminal fluid density within the distal colon. Given the provided history, findings are suggestive of acute enteritis/diarrheal illness. 2. No other acute intra-abdominal or pelvic process identified. 3. Widespread osseous metastatic disease, better evaluated on most recent PET-CT from 07/19/2015. No pathologic fracture or other complication. 4. 11 mm hypodensity within the left kidney. This lesion measures intermediate density, and is indeterminate. While this lesion is similar in size relative to recent studies, this is increased in size relative to prior study from 2012. Further evaluation with dedicated renal mass protocol CT and/or MRI is recommended for complete characterization.    12/2015 - 05/14/2016 Chemotherapy    Maintenance Herceptin and pejeta every 3 weeks stopped on 05/14/16 due to disease progression    12/25/2015 Imaging    Restaging PET Scan 1. Markedly improved skeletal activity, with only several faint foci of residual accentuated metabolic activity, but resolution of the vast majority of the previously extensive osseous metastatic disease. 2. Resolution of the prior right internal mammary and right prevascular lymph nodes. 3. Residual subcutaneous edema and edema along fascia planes in the left upper thigh. This remains somewhat more than I would expect for placement of an IM nail 6 weeks ago. If there is leg swelling further down, consider Doppler venous ultrasound to rule out left lower extremity DVT. I do not perceive an obvious difference in density between the pelvic and common femoral veins on the noncontrast CT data. 4. Chronic right maxillary and right sphenoid sinusitis.    01/16/2016 - 10/20/2017 Anti-estrogen oral therapy    Letrozole 2.5 mg daily. She was switched to exemestane due to disease progression on 02/08/17. Stopped on 10/20/2017 when treatment changed to chemo     03/01/2016 Miscellaneous    Patient presented to  ED following a fall; she reports she landed on her back and is now experiencing back pain. The patient was evaluated and discharge home same day with pain control.    05/01/2016 Imaging    CT CAP IMPRESSION: 1. Stable exam.  No new or progressive disease identified. 2. No evidence for residual or recurrent adenopathy within the chest. 3. Stable bone metastasis.    05/01/2016 Imaging    BONE SCAN IMPRESSION: 1. Small focal uptake involving the anterior rib ends of the left second and right fourth ribs, new since the prior bone scan. The location of this uptake is more suggestive of a traumatic or inflammatory etiology as opposed to metastatic disease. No other evidence of metastatic disease. 2. There are other areas of stable uptake which are most likely degenerative/reactive, including right shoulder and cervical spine uptake and uptake along the right femur adjacent to the ORIF hardware.    05/20/2016 Imaging    NM PET Skull to Thigh IMPRESSION: 1. There multiple metastatic foci in the bony pelvis, including some new abnormal foci compare to the prior PET-CT, compatible with mildly progressive bony metastatic disease. Also, a left T11 vertebral hypermetabolic metastatic lesion is observed, new compared to the prior exam. 2. No active extraosseous malignancy is currently identified. 3. Right anterior fourth rib healing fracture, appears be    06/04/2016 - 01/08/2017 Chemotherapy    Second line chemotherapy Kadcyla every 3 weeks started on 06/04/16 and stopped on 01/08/17 due to mixed response.      10/05/2016 Imaging  CT CAP and Bone Scan:  Bones: left intramedullary rod and left femoral head neck screw in place. In the proximal diaphysis of the left femur there is cortical thickening and irregularity. No lytic or blastic bone lesions. No fracture or vertebral endplate destruction.   No definite evidence of recurrent disease in the chest, abdomen, or pelvis.    10/28/2016  Imaging    CT A/P IMPRESSION: No acute process demonstrated in the abdomen or pelvis. No evidence of bowel obstruction or inflammation.    11/04/2016 Imaging    DG foot complete left: IMPRESSION: No fracture or dislocation.  No soft tissue abnormality    11/23/2016 Imaging    CT RIGHT FEMUR IMPRESSION: Negative CT scan of the right thigh.  No visible metastatic disease.  CT LEFT FEMUR IMPRESSION: 1. Postsurgical changes related to prior cephalomedullary rod fixation of the left femur with linear lucency along the anterolateral proximal femoral cortex at level of the lesser trochanter, suspicious for nondisplaced fracture. 2. Slightly permeative appearance of the proximal left femoral cortex at the site of known prior osseous metastases is largely unchanged. 3. Unchanged patchy lucency and sclerosis along the anterior acetabulum, which appears to correspond with an area of faint uptake on the prior PET-CT, suspicious for osseous metastasis. These results will be called to the ordering clinician or representative by the Radiologist Assistant, and communication documented in the PACS or zVision Dashboard.      01/27/2017 Imaging    IMPRESSION: 1. Mixed response to osseous metastasis. Some hypermetabolic lesions are new and increasingly hypermetabolic, while another index lesion is decreasingly hypermetabolic. 2. No evidence of hypermetabolic soft tissue metastasis.     02/08/2017 - 10/19/2017 Antibody Plan    -Herceptin every 3 week since 02/08/17 -Oral Lanpantinib 1526m and decreased to 5042mdue to diarrhea and depression. Due to disease progression she swithced to Verzenio 15074mn 05/04/17. She did not toelrate well so she was switched to Ibrance 100m88m 05/31/17. Due to her neutropenia, Ibrance dose reduced to 75 mg daily, 3 weeks on, one-week off, stopped on 10/20/2017 due to disease progression.     04/30/2017 PET scan    IMPRESSION: 1. Primarily increase in  hypermetabolism of osseous metastasis. An isolated right acetabular lesion is decreased in hypermetabolism since the prior exam. 2. No evidence of hypermetabolic soft tissue metastasis. 3. Similar non FDG avid right-sided thyroid nodule, favoring a benign etiology. Recommend attention on follow-up.    08/30/2017 PET scan    08/30/2017 PET Scan  IMPRESSION: 1. Worsening osseous metastatic disease. 2. Focal hypermetabolism in the central spinal canal the L1 level, similar to the prior exam. Metastatic implant cannot be excluded. 3. Slight enlargement of a right thyroid nodule which now appears more cystic in character. Consider further evaluation with thyroid ultrasound. If patient is clinically hyperthyroid, consider nuclear medicine thyroid uptake and scan    08/30/2017 Progression    08/30/2017 PET Scan  IMPRESSION: 1. Worsening osseous metastatic disease. 2. Focal hypermetabolism in the central spinal canal the L1 level, similar to the prior exam. Metastatic implant cannot be excluded. 3. Slight enlargement of a right thyroid nodule which now appears more cystic in character. Consider further evaluation with thyroid ultrasound. If patient is clinically hyperthyroid, consider nuclear medicine thyroid uptake and scan    09/29/2017 - 10/11/2017 Radiation Therapy    Pt received 27 Gy in 9 fractions directed at the areas of significant uptake noted on recent bone scan the right pelvis and right proximal femur, with  Dr, Sondra Come     10/19/2017 -  Chemotherapy    -Herceptin every 3 weeks starting 02/08/17, perjeta added on 10/20/2017  -weekly Taxol started on 10/21/2017     10/27/2017 Imaging    10/27/2017 CT AP IMPRESSION: 1. No acute abnormality. No evidence of bowel obstruction or acute bowel inflammation. Normal appendix. 2. Stable patchy sclerotic osseous metastases throughout the visualized skeleton. No new or progressive metastatic disease in the abdomen or pelvis.       CURRENT THERAPY:  Weekly Taxol, Herceptin and Perjeta every 3 weeks  INTERVAL HISTORY: [name] is here for a follow up of her metastatic breast cancer and treatment.  She has taking some Flexeril as needed since last week, her chest pain has improved.  She did not feel the Protonix.  She tolerated chemotherapy well last week, but developed nausea and vomiting multiple times yesterday, she did not take any antiemetics.  She feels better this morning, no fever, abdominal pain, or other new symptoms.  She is still moderately fatigued, able to function at home.   REVIEW OF SYSTEMS:   Constitutional: Denies fevers, chills or abnormal weight loss, (+) fatigue  Eyes: Denies blurriness of vision Ears, nose, mouth, throat, and face: Denies mucositis or sore throat Respiratory: Denies cough, dyspnea or wheezes Cardiovascular: Denies palpitation, or lower extremity swelling, (+) chest pain  Gastrointestinal:  Denies nausea, heartburn or change in bowel habits Skin: Denies abnormal skin rashes Lymphatics: Denies new lymphadenopathy or easy bruising Neurological:Denies numbness, tingling or new weaknesses Behavioral/Psych: Mood is stable, no new changes  All other systems were reviewed with the patient and are negative.  MEDICAL HISTORY:  Past Medical History:  Diagnosis Date  . Abnormal Pap smear   . Anxiety   . Breast cancer ([place])   . Cancer ([place]) 1999   breast-s/p mastectomy, chemo, rad  . CIN I (cervical intraepithelial neoplasia I) 2003   by colpo  . Congenital deafness   . Deaf   . Depression   . Port-A-Cath in place 2017   for chemotherapy  . Radiation 07/12/15-07/26/15   left femur 30 Gy  . S/P bilateral oophorectomy     SURGICAL HISTORY: Past Surgical History:  Procedure Laterality Date  . ABDOMINAL HYSTERECTOMY    . INTRAMEDULLARY (IM) NAIL INTERTROCHANTERIC Left 06/26/2015   Procedure: LEFT BIOMET LONG AFFIXS NAIL;  Surgeon: [name], MD;  Location: [facility];   Service: Orthopedics;  Laterality: Left;  . IR GENERIC HISTORICAL  08/06/2015   IR US GUIDE VASC ACCESS LEFT 08/06/2015 Aletta Edouard, MD [facility]  . IR GENERIC HISTORICAL  08/06/2015   IR FLUORO GUIDE CV LINE LEFT 08/06/2015 Aletta Edouard, MD [facility]  . IR GENERIC HISTORICAL  03/05/2016   IR CV LINE INJECTION 03/05/2016 Markus Daft, MD [facility]  . LEFT HEART CATH AND CORONARY ANGIOGRAPHY N/A 12/13/2017   Procedure: LEFT HEART CATH AND CORONARY ANGIOGRAPHY;  Surgeon: Leonie Man, MD;  Location: [facility];  Service: Cardiovascular;  Laterality: N/A;  . MASTECTOMY     right breast  . MASTECTOMY    . OVARIAN CYST REMOVAL    . RADIOLOGY WITH ANESTHESIA Left 06/25/2015   Procedure: MRI OF LEFT HIP WITH OR WITHOUT CONTRAST;  Surgeon: Medication Radiologist, MD;  Location: [facility];  Service: Radiology;  Laterality: Left;  DR. MCINTYRE/MRI  . TUBAL LIGATION      I have reviewed the social history and family history with the patient and they are  unchanged from previous note.  ALLERGIES:  is allergic to chlorhexidine; promethazine hcl; and diphenhydramine hcl.  MEDICATIONS:  Current Outpatient Medications  Medication Sig Dispense Refill  . ALPRAZolam (XANAX) 0.25 MG tablet Take 1 tablet (0.25 mg total) by mouth 2 (two) times daily as needed for anxiety. 30 tablet 0  . cyclobenzaprine (FLEXERIL) 5 MG tablet Take 1 tablet (5 mg total) by mouth 3 (three) times daily as needed for muscle spasms. 30 tablet 0  . divalproex (DEPAKOTE) 250 MG DR tablet Take 1 tablet (250 mg total) by mouth at bedtime. 30 tablet 2  . HYDROcodone-acetaminophen (NORCO) 10-325 MG tablet Take 1 tablet by mouth every 6 (six) hours as needed. (Patient taking differently: Take 1 tablet by mouth every 6 (six) hours as needed (pain). ) 45 tablet 0  . lidocaine-prilocaine (EMLA) cream Apply 1 application topically as needed. Apply to Porta-Cath 1-2 hours prior to access as directed. 30 g 2  . Multiple Vitamin  (MULTIVITAMIN WITH MINERALS) TABS tablet Take 1 tablet by mouth daily.    . pantoprazole (PROTONIX) 20 MG tablet Take 1 tablet (20 mg total) by mouth daily. 60 tablet 0  . potassium chloride (K-DUR) 10 MEQ tablet Take 1 tablet (10 mEq total) by mouth 2 (two) times daily. 60 tablet 0  . prochlorperazine (COMPAZINE) 10 MG tablet Take 1 tablet (10 mg total) by mouth every 6 (six) hours as needed for nausea or vomiting. 30 tablet 0   No current facility-administered medications for this visit.    Facility-Administered Medications Ordered in Other Visits  Medication Dose Route Frequency Provider Last Rate Last Dose  . sodium chloride 0.9 % 1,000 mL with potassium chloride 10 mEq infusion   Intravenous Continuous Gordy Levan, MD   Stopped at 09/10/15 1653  . sodium chloride 0.9 % injection 10 mL  10 mL Intravenous PRN Livesay, Lennis P, MD        PHYSICAL EXAMINATION: ECOG PERFORMANCE STATUS: 1 - Symptomatic but completely ambulatory  Vitals:   12/24/17 0836  BP: 123/78  Pulse: (!) 102  Resp: 18  Temp: 98.1 F (36.7 C)  SpO2: 99%   Filed Weights   12/24/17 0836  Weight: 130 lb 6.4 oz (59.1 kg)    GENERAL:alert, no distress and comfortable SKIN: skin color, texture, turgor are normal, no rashes or significant lesions EYES: normal, Conjunctiva are pink and non-injected, sclera clear OROPHARYNX:no exudate, no erythema and lips, buccal mucosa, and tongue normal  NECK: supple, thyroid normal size, non-tender, without nodularity LYMPH:  no palpable lymphadenopathy in the cervical, axillary or inguinal LUNGS: clear to auscultation and percussion with normal breathing effort HEART: regular rate & rhythm and no murmurs and no lower extremity edema ABDOMEN:abdomen soft, non-tender and normal bowel sounds Musculoskeletal:no cyanosis of digits and no clubbing  NEURO: alert & oriented x 3 with fluent speech, no focal motor/sensory deficits  LABORATORY DATA:  I have reviewed the data as  listed CBC Latest Ref Rng & Units 12/24/2017 12/17/2017 12/10/2017  WBC 4.0 - 10.5 K/uL 3.2(L) 5.9 4.0  Hemoglobin 12.0 - 15.0 g/dL 10.5(L) 10.1(L) 10.0(L)  Hematocrit 36.0 - 46.0 % 31.8(L) 30.5(L) 29.6(L)  Platelets 150 - 400 K/uL 257 182 143(L)     CMP Latest Ref Rng & Units 12/17/2017 12/10/2017 11/24/2017  Glucose 70 - 99 mg/dL 85 89 88  BUN 6 - 20 mg/dL 14 13 11   Creatinine 0.44 - 1.00 mg/dL 0.83 0.70 0.88  Sodium 135 - 145 mmol/L 140 140 139  Potassium 3.5 - 5.1 mmol/L 3.6 3.5 3.9  Chloride 98 - 111 mmol/L 104 108 106  CO2 22 - 32 mmol/L 24 24 24   Calcium 8.9 - 10.3 mg/dL 9.3 8.7(L) 9.3  Total Protein 6.5 - 8.1 g/dL 6.3(L) 6.1(L) 6.5  Total Bilirubin 0.3 - 1.2 mg/dL 1.7(H) 1.8(H) 2.0(H)  Alkaline Phos 38 - 126 U/L 64 61 64  AST 15 - 41 U/L 19 14(L) 14(L)  ALT 0 - 44 U/L 33 17 19      RADIOGRAPHIC STUDIES: I have personally reviewed the radiological images as listed and agreed with the findings in the report. No results found.   ASSESSMENT & PLAN:  [name] is a 52 y.o. female with   1. Chest pain --she has been evaluated by cardiologist Dr. Radford Pax, stress was positive, she underwent cardiac catheterization earlier this week, and it was normal.    I have discussed with cardiologist Dr. Radford Pax, she thinks her chest pain is not cardiac related. -Her chest pain has improved with Flexeril, she did not take Protonix. -I plan to repeat her staging scan in the next few weeks, to rule out new bone metastasis.  2. Metastatic right breast cancer to bone, ER+ HER2+ -She had excellent response to initial docetaxel, Herceptin and Perjeta, progressed on maintenance Herceptin, Perjeta and letrozole, has had multiple line therapy of Her2 antibodies and endocrine therapy. She is currently on weekly Taxol, Herceptin and Perjeta, tolerating moderately well, with fatigue, decreased appetite, taste change, and hair loss. -Due to her recent significant mid chest pain, I ordered a PET  scan to evaluate her disease status, it was denied by her insurance, I will appeal. -Lab reviewed, adequate for treatment, will continue Taxol, Herceptin and Perjeta today.  She is on weekly Taxol 3 weeks on, one-week off.  3. Congenital deafness  4. Mild anemia, secondary to chemotherapy and malignancy -stable   5. Hypokalemia -He has intermittent hyperkalemia, on oral supplement  -She has not been taking oral potassium in the past 2 3 days, due to nausea  6. Nausea an vomiting -Secondary to chemotherapy. -I refilled her Compazine, she states Zofran does not help her.  6.Goal of care discussion  -she is full code for now   Plan -Lab reviewed, adequate for treatment, will proceed Taxol, Herceptin and Perjeta today -I refilled Compazine -I will appear her PET scan, if is still denied, will change to CT and bone scan, to be done in the next few weeks -Follow-up in 2 weeks.  She will have chemo break next week.  No problem-specific Assessment & Plan notes found for this encounter.   No orders of the defined types were placed in this encounter.  All questions were answered. The patient knows to call the clinic with any problems, questions or concerns. No barriers to learning was detected. I spent 20 minutes counseling the patient face to face. The total time spent in the appointment was 25 minutes and more than 50% was on counseling and review of test results     Truitt Merle, MD 12/24/2017   I, Joslyn Devon, am acting as scribe for Truitt Merle, MD.   I have reviewed the above documentation for accuracy and completeness, and I agree with the above.

## 2017-12-24 ENCOUNTER — Encounter: Payer: Self-pay | Admitting: Hematology

## 2017-12-24 ENCOUNTER — Inpatient Hospital Stay: Payer: Medicaid Other

## 2017-12-24 ENCOUNTER — Inpatient Hospital Stay (HOSPITAL_BASED_OUTPATIENT_CLINIC_OR_DEPARTMENT_OTHER): Payer: Medicaid Other | Admitting: Hematology

## 2017-12-24 VITALS — HR 95

## 2017-12-24 VITALS — BP 123/78 | HR 102 | Temp 98.1°F | Resp 18 | Ht 65.0 in | Wt 130.4 lb

## 2017-12-24 DIAGNOSIS — C7951 Secondary malignant neoplasm of bone: Secondary | ICD-10-CM

## 2017-12-24 DIAGNOSIS — E876 Hypokalemia: Secondary | ICD-10-CM | POA: Diagnosis not present

## 2017-12-24 DIAGNOSIS — Z923 Personal history of irradiation: Secondary | ICD-10-CM

## 2017-12-24 DIAGNOSIS — G4700 Insomnia, unspecified: Secondary | ICD-10-CM

## 2017-12-24 DIAGNOSIS — F419 Anxiety disorder, unspecified: Secondary | ICD-10-CM | POA: Diagnosis not present

## 2017-12-24 DIAGNOSIS — C799 Secondary malignant neoplasm of unspecified site: Principal | ICD-10-CM

## 2017-12-24 DIAGNOSIS — Z79899 Other long term (current) drug therapy: Secondary | ICD-10-CM | POA: Diagnosis not present

## 2017-12-24 DIAGNOSIS — R079 Chest pain, unspecified: Secondary | ICD-10-CM

## 2017-12-24 DIAGNOSIS — Z170 Estrogen receptor positive status [ER+]: Secondary | ICD-10-CM | POA: Diagnosis not present

## 2017-12-24 DIAGNOSIS — C50911 Malignant neoplasm of unspecified site of right female breast: Secondary | ICD-10-CM

## 2017-12-24 DIAGNOSIS — Z5112 Encounter for antineoplastic immunotherapy: Secondary | ICD-10-CM | POA: Diagnosis not present

## 2017-12-24 DIAGNOSIS — H905 Unspecified sensorineural hearing loss: Secondary | ICD-10-CM

## 2017-12-24 DIAGNOSIS — D6481 Anemia due to antineoplastic chemotherapy: Secondary | ICD-10-CM | POA: Diagnosis not present

## 2017-12-24 DIAGNOSIS — Z79811 Long term (current) use of aromatase inhibitors: Secondary | ICD-10-CM

## 2017-12-24 DIAGNOSIS — R112 Nausea with vomiting, unspecified: Secondary | ICD-10-CM

## 2017-12-24 DIAGNOSIS — C50919 Malignant neoplasm of unspecified site of unspecified female breast: Secondary | ICD-10-CM

## 2017-12-24 LAB — COMPREHENSIVE METABOLIC PANEL
ALBUMIN: 3.7 g/dL (ref 3.5–5.0)
ALK PHOS: 69 U/L (ref 38–126)
ALT: 26 U/L (ref 0–44)
AST: 35 U/L (ref 15–41)
Anion gap: 9 (ref 5–15)
BILIRUBIN TOTAL: 2.5 mg/dL — AB (ref 0.3–1.2)
BUN: 12 mg/dL (ref 6–20)
CO2: 21 mmol/L — ABNORMAL LOW (ref 22–32)
Calcium: 8.7 mg/dL — ABNORMAL LOW (ref 8.9–10.3)
Chloride: 108 mmol/L (ref 98–111)
Creatinine, Ser: 0.85 mg/dL (ref 0.44–1.00)
GFR calc Af Amer: >60 mL/min (ref >60)
GFR calc non Af Amer: >60 mL/min (ref >60)
GLUCOSE: 92 mg/dL (ref 70–99)
Potassium: 3.6 mmol/L (ref 3.5–5.1)
Sodium: 138 mmol/L (ref 135–145)
Total Protein: 6.9 g/dL (ref 6.5–8.1)

## 2017-12-24 LAB — CBC WITH DIFFERENTIAL/PLATELET
Abs Immature Granulocytes: 0.11 10*3/uL — ABNORMAL HIGH (ref 0.00–0.07)
Basophils Absolute: 0 10*3/uL (ref 0.0–0.1)
Basophils Relative: 1 %
Eosinophils Absolute: 0 10*3/uL (ref 0.0–0.5)
Eosinophils Relative: 1 %
HCT: 31.8 % — ABNORMAL LOW (ref 36.0–46.0)
Hemoglobin: 10.5 g/dL — ABNORMAL LOW (ref 12.0–15.0)
Immature Granulocytes: 4 %
Lymphocytes Relative: 23 %
Lymphs Abs: 0.7 10*3/uL (ref 0.7–4.0)
MCH: 30.7 pg (ref 26.0–34.0)
MCHC: 33 g/dL (ref 30.0–36.0)
MCV: 93 fL (ref 80.0–100.0)
Monocytes Absolute: 0.2 10*3/uL (ref 0.1–1.0)
Monocytes Relative: 6 %
Neutro Abs: 2.1 10*3/uL (ref 1.7–7.7)
Neutrophils Relative %: 65 %
Platelets: 257 10*3/uL (ref 150–400)
RBC: 3.42 MIL/uL — ABNORMAL LOW (ref 3.87–5.11)
RDW: 14.4 % (ref 11.5–15.5)
WBC: 3.2 10*3/uL — ABNORMAL LOW (ref 4.0–10.5)
nRBC: 0 % (ref 0.0–0.2)

## 2017-12-24 MED ORDER — FAMOTIDINE IN NACL 20-0.9 MG/50ML-% IV SOLN
20.0000 mg | Freq: Once | INTRAVENOUS | Status: AC
  Administered 2017-12-24: 20 mg via INTRAVENOUS

## 2017-12-24 MED ORDER — LORATADINE 10 MG PO TABS
10.0000 mg | ORAL_TABLET | Freq: Every day | ORAL | Status: DC
  Administered 2017-12-24: 10 mg via ORAL

## 2017-12-24 MED ORDER — DEXAMETHASONE SODIUM PHOSPHATE 10 MG/ML IJ SOLN
10.0000 mg | Freq: Once | INTRAMUSCULAR | Status: AC
  Administered 2017-12-24: 10 mg via INTRAVENOUS

## 2017-12-24 MED ORDER — TRASTUZUMAB CHEMO 150 MG IV SOLR
6.0000 mg/kg | Freq: Once | INTRAVENOUS | Status: AC
  Administered 2017-12-24: 336 mg via INTRAVENOUS
  Filled 2017-12-24: qty 16

## 2017-12-24 MED ORDER — FAMOTIDINE IN NACL 20-0.9 MG/50ML-% IV SOLN
INTRAVENOUS | Status: AC
  Filled 2017-12-24: qty 50

## 2017-12-24 MED ORDER — ACETAMINOPHEN 325 MG PO TABS
650.0000 mg | ORAL_TABLET | Freq: Once | ORAL | Status: AC
  Administered 2017-12-24: 650 mg via ORAL

## 2017-12-24 MED ORDER — SODIUM CHLORIDE 0.9 % IV SOLN
420.0000 mg | Freq: Once | INTRAVENOUS | Status: AC
  Administered 2017-12-24: 420 mg via INTRAVENOUS
  Filled 2017-12-24: qty 14

## 2017-12-24 MED ORDER — LORATADINE 10 MG PO TABS
ORAL_TABLET | ORAL | Status: AC
  Filled 2017-12-24: qty 1

## 2017-12-24 MED ORDER — DEXAMETHASONE SODIUM PHOSPHATE 10 MG/ML IJ SOLN
INTRAMUSCULAR | Status: AC
  Filled 2017-12-24: qty 1

## 2017-12-24 MED ORDER — ACETAMINOPHEN 325 MG PO TABS
ORAL_TABLET | ORAL | Status: AC
  Filled 2017-12-24: qty 2

## 2017-12-24 MED ORDER — HEPARIN SOD (PORK) LOCK FLUSH 100 UNIT/ML IV SOLN
500.0000 [IU] | Freq: Once | INTRAVENOUS | Status: AC | PRN
  Administered 2017-12-24: 500 [IU]
  Filled 2017-12-24: qty 5

## 2017-12-24 MED ORDER — SODIUM CHLORIDE 0.9% FLUSH
10.0000 mL | INTRAVENOUS | Status: DC | PRN
  Administered 2017-12-24: 10 mL
  Filled 2017-12-24: qty 10

## 2017-12-24 MED ORDER — SODIUM CHLORIDE 0.9 % IV SOLN
Freq: Once | INTRAVENOUS | Status: AC
  Administered 2017-12-24: 10:00:00 via INTRAVENOUS
  Filled 2017-12-24: qty 250

## 2017-12-24 MED ORDER — PROCHLORPERAZINE MALEATE 10 MG PO TABS: 10.0000 mg | ORAL_TABLET | Freq: Four times a day (QID) | ORAL | 0 refills | Status: DC | PRN

## 2017-12-24 MED ORDER — SODIUM CHLORIDE 0.9 % IV SOLN
80.0000 mg/m2 | Freq: Once | INTRAVENOUS | Status: AC
  Administered 2017-12-24: 132 mg via INTRAVENOUS
  Filled 2017-12-24: qty 22

## 2017-12-24 NOTE — Progress Notes (Signed)
Per Dr. Burr Medico: OK to treat with bilirubin of 2.5

## 2017-12-24 NOTE — Addendum Note (Signed)
Addended by: Truitt Merle on: 12/24/2017 06:05 PM   Modules accepted: Orders

## 2017-12-24 NOTE — Patient Instructions (Signed)
[facility] Discharge Instructions for Patients Receiving Chemotherapy  Today you received the following chemotherapy agents: Trastuzumab (Herceptin), Pertuzumab (Perjeta), and Paclitaxel (Taxol)  To help prevent nausea and vomiting after your treatment, we encourage you to take your nausea medication as directed.    If you develop nausea and vomiting that is not controlled by your nausea medication, call the clinic.   BELOW ARE SYMPTOMS THAT SHOULD BE REPORTED IMMEDIATELY:  *FEVER GREATER THAN 100.5 F  *CHILLS WITH OR WITHOUT FEVER  NAUSEA AND VOMITING THAT IS NOT CONTROLLED WITH YOUR NAUSEA MEDICATION  *UNUSUAL SHORTNESS OF BREATH  *UNUSUAL BRUISING OR BLEEDING  TENDERNESS IN MOUTH AND THROAT WITH OR WITHOUT PRESENCE OF ULCERS  *URINARY PROBLEMS  *BOWEL PROBLEMS  UNUSUAL RASH Items with * indicate a potential emergency and should be followed up as soon as possible.  Feel free to call the clinic should you have any questions or concerns. The clinic phone number is [phone].  Please show the [facility] at check-in to the Emergency Department and triage nurse.

## 2017-12-28 NOTE — Telephone Encounter (Signed)
Spoke with the patient, echo was scheduled for 12/24 before appointment with Pecolia Ades. Left message with interpreter company to call and schedule interpreter for appointment.

## 2017-12-29 ENCOUNTER — Telehealth: Payer: Self-pay | Admitting: Hematology

## 2017-12-29 NOTE — Telephone Encounter (Signed)
Spoke with interpreter in regards to patient upcoming appointments.  Printed and mailed calendar.

## 2018-01-04 ENCOUNTER — Ambulatory Visit (INDEPENDENT_AMBULATORY_CARE_PROVIDER_SITE_OTHER): Payer: Medicaid Other | Admitting: Cardiology

## 2018-01-04 ENCOUNTER — Ambulatory Visit (HOSPITAL_COMMUNITY): Payer: Medicaid Other | Attending: Cardiovascular Disease

## 2018-01-04 ENCOUNTER — Other Ambulatory Visit: Payer: Self-pay

## 2018-01-04 ENCOUNTER — Encounter: Payer: Self-pay | Admitting: Cardiology

## 2018-01-04 VITALS — BP 118/74 | HR 75 | Ht 65.0 in | Wt 132.8 lb

## 2018-01-04 DIAGNOSIS — R079 Chest pain, unspecified: Secondary | ICD-10-CM | POA: Diagnosis not present

## 2018-01-04 DIAGNOSIS — R002 Palpitations: Secondary | ICD-10-CM

## 2018-01-04 NOTE — Progress Notes (Signed)
Cardiology Office Note:    Date:  01/04/2018   ID:  [name], DOB July 01, 1965, MRN [id number]  PCP:  [name], [name]  Cardiologist:  Fransico Him, MD  Referring MD: [name],*   Chief Complaint  Patient presents with  . Chest Pain    History of Present Illness:    [name] is a 52 y.o. 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  She was seen by Dr. Radford Pax on 11/19/2017 for evaluation of chest pain.  She underwent stress echo which showed resting inferior hypokinesis which worsened with exercise and consistent with ischemia in the RCA territory.  Cardiac catheterization was arranged and done on 12/13/2017.  Findings showed only minimal CAD with 20% ostial/proximal circumflex stenosis, otherwise normal coronary arteries.  No evidence of RCA disease to explain inferior ischemia.  Dr. Radford Pax has ordered an echo to assess for pericardial effusion and LV function, to be done today.  [name] is here today with a sign language interpreter from [facility]. She says that she still has some occ chest pressure and sometimes with shortness of breath but not always. The episodes are not as severe or as frequent as before.  She has stress related to caring for her husband and her daughter lives with her with her 3 grandchildren. She thinks her symptoms may be related to stress. She admits to occ fast, regular heart beats. Yesterday she felt her heart racing for about several minutes and she wasn't doing anything. She also feels it racing when she goes up the stairs in her apartment. Occurs up to twice a week, but she really has not paid enough attention to be able to tell me details. She sometimes has dizziness with the palpitations, no syncope. She has no orthopnea, PND or edema.   Past Medical History:    Diagnosis Date  . Abnormal Pap smear   . Anxiety   . Breast cancer ([place])   . Cancer ([place]) 1999   breast-s/p mastectomy, chemo, rad  . CIN I (cervical intraepithelial neoplasia I) 2003   by colpo  . Congenital deafness   . Deaf   . Depression   . Port-A-Cath in place 2017   for chemotherapy  . Radiation 07/12/15-07/26/15   left femur 30 Gy  . S/P bilateral oophorectomy     Past Surgical History:  Procedure Laterality Date  . ABDOMINAL HYSTERECTOMY    . INTRAMEDULLARY (IM) NAIL INTERTROCHANTERIC Left 06/26/2015   Procedure: LEFT BIOMET LONG AFFIXS NAIL;  Surgeon: [name], MD;  Location: [facility];  Service: Orthopedics;  Laterality: Left;  . IR GENERIC HISTORICAL  08/06/2015   IR US GUIDE VASC ACCESS LEFT 08/06/2015 Aletta Edouard, MD [facility]  . IR GENERIC HISTORICAL  08/06/2015   IR FLUORO GUIDE CV LINE LEFT 08/06/2015 Aletta Edouard, MD [facility]  . IR GENERIC HISTORICAL  03/05/2016   IR CV LINE INJECTION 03/05/2016 Markus Daft, MD [facility]  . LEFT HEART CATH AND CORONARY ANGIOGRAPHY N/A 12/13/2017   Procedure: LEFT HEART CATH AND CORONARY ANGIOGRAPHY;  Surgeon: Leonie Man, MD;  Location: [facility];  Service: Cardiovascular;  Laterality: N/A;  . MASTECTOMY     right breast  . MASTECTOMY    . OVARIAN CYST REMOVAL    . RADIOLOGY WITH ANESTHESIA Left 06/25/2015   Procedure: MRI OF LEFT  HIP WITH OR WITHOUT CONTRAST;  Surgeon: Medication Radiologist, MD;  Location: [facility];  Service: Radiology;  Laterality: Left;  DR. MCINTYRE/MRI  . TUBAL LIGATION      Current Medications: Current Meds  Medication Sig  . ALPRAZolam (XANAX) 0.25 MG tablet Take 1 tablet (0.25 mg total) by mouth 2 (two) times daily as needed for anxiety.  . cyclobenzaprine (FLEXERIL) 5 MG tablet Take 1 tablet (5 mg total) by mouth 3 (three) times daily as needed for muscle spasms.  . divalproex (DEPAKOTE) 250 MG DR tablet Take 1 tablet (250 mg total) by mouth at bedtime.  Marland Kitchen  HYDROcodone-acetaminophen (NORCO) 10-325 MG tablet Take 1 tablet by mouth every 6 (six) hours as needed. (Patient taking differently: Take 1 tablet by mouth every 6 (six) hours as needed (pain). )  . lidocaine-prilocaine (EMLA) cream Apply 1 application topically as needed. Apply to Porta-Cath 1-2 hours prior to access as directed.  . Multiple Vitamin (MULTIVITAMIN WITH MINERALS) TABS tablet Take 1 tablet by mouth daily.  . pantoprazole (PROTONIX) 20 MG tablet Take 1 tablet (20 mg total) by mouth daily.  . potassium chloride (K-DUR) 10 MEQ tablet Take 1 tablet (10 mEq total) by mouth 2 (two) times daily.  . prochlorperazine (COMPAZINE) 10 MG tablet Take 1 tablet (10 mg total) by mouth every 6 (six) hours as needed for nausea or vomiting.     Allergies:   Chlorhexidine; Promethazine hcl; and Diphenhydramine hcl   Social History   Socioeconomic History  . Marital status: Married    Spouse name: Not on file  . Number of children: 2  . Years of education: Not on file  . Highest education level: Not on file  Occupational History  . Not on file  Social Needs  . Financial resource strain: Not on file  . Food insecurity:    Worry: Not on file    Inability: Not on file  . Transportation needs:    Medical: Not on file    Non-medical: Not on file  Tobacco Use  . Smoking status: Never Smoker  . Smokeless tobacco: Never Used  Substance and Sexual Activity  . Alcohol use: No  . Drug use: No  . Sexual activity: Yes    Birth control/protection: Surgical  Lifestyle  . Physical activity:    Days per week: Not on file    Minutes per session: Not on file  . Stress: Not on file  Relationships  . Social connections:    Talks on phone: Not on file    Gets together: Not on file    Attends religious service: Not on file    Active member of club or organization: Not on file    Attends meetings of clubs or organizations: Not on file    Relationship status: Not on file  Other Topics Concern  .  Not on file  Social History Narrative  . Not on file     Family History: The patient's family history includes Alzheimer's disease in her maternal uncle; Heart attack in her maternal grandfather; Hypertension in her mother; Pancreatitis in her maternal grandmother; Seizures in her mother. ROS:   Please see the history of present illness.     All other systems reviewed and are negative.  EKGs/Labs/Other Studies Reviewed:    The following studies were reviewed today:  [facility]HC 12/13/2017  Ost Cx lesion is 20% stenosed.   Angiographically minimal CAD with 20% ostial/proximal Circumflex Stenosis, but otherwise normal coronary arteries and a Rright dominant  system. -No evidence of RCA disease to explain inferior ischemia.    EKG:  EKG is not ordered today.   Recent Labs: 12/24/2017: ALT 26; BUN 12; Creatinine, Ser 0.85; Hemoglobin 10.5; Platelets 257; Potassium 3.6; Sodium 138   Recent Lipid Panel No results found for: CHOL, TRIG, HDL, CHOLHDL, VLDL, LDLCALC, LDLDIRECT  Physical Exam:    VS:  BP 118/74   Pulse 75   Ht 5\' 5"  (1.651 m)   Wt 132 lb 12.8 oz (60.2 kg)   SpO2 98%   BMI 22.10 kg/m     Wt Readings from Last 3 Encounters:  01/04/18 132 lb 12.8 oz (60.2 kg)  12/24/17 130 lb 6.4 oz (59.1 kg)  12/13/17 126 lb (57.2 kg)     Physical Exam  Constitutional: She is oriented to person, place, and time. She appears well-developed and well-nourished. No distress.  HENT:  Head: Normocephalic and atraumatic.  Neck: Normal range of motion. Neck supple. No JVD present.  Cardiovascular: Normal rate, regular rhythm, normal heart sounds and intact distal pulses. Exam reveals no gallop and no friction rub.  No murmur heard. Pulmonary/Chest: Effort normal and breath sounds normal. No respiratory distress. She has no wheezes. She has no rales.  Abdominal: Soft. Bowel sounds are normal.  Musculoskeletal: Normal range of motion.        General: No deformity or edema.  Neurological:  She is alert and oriented to person, place, and time.  Skin: Skin is warm and dry.  Psychiatric: She has a normal mood and affect. Her behavior is normal. Judgment and thought content normal.  Vitals reviewed.   ASSESSMENT:    1. Chest pain, unspecified type   2. Rapid palpitations    PLAN:    In order of problems listed above:  Atypical chest pain -Patient had stress echocardiogram suspicious for RCA territory ischemia but cardiac cath showed only very mild, 20% stenosis of the circumflex and no RCA disease. -Dr. Radford Pax has ordered an echocardiogram to evaluate for pericardial effusion and LV function, done this morning. I will check results when available and call pt. She says OK to leave a message on her voice mail.  -Her symptoms have been improving and she feels that he symptoms may be related to stress.   Palpitations -Pt notes an occasional racing heart beat, regular, sometimes at rest, up to maybe 5 minutes with dizziness. No syncope. In one sense she describes it as significant, then she says it doesn't really bother her.  -I strongly advised an outpatient monitor to evaluate but she is adamant that she does not want a monitor. I advised her to think about it and call if she changes her mind.  -Could be SVT, not consistent with afib.   Medication Adjustments/Labs and Tests Ordered: Current medicines are reviewed at length with the patient today.  Concerns regarding medicines are outlined above. Labs and tests ordered and medication changes are outlined in the patient instructions below:  Patient Instructions  Medication Instructions:  Your physician recommends that you continue on your current medications as directed. Please refer to the Current Medication list given to you today.  If you need a refill on your cardiac medications before your next appointment, please call your pharmacy.   Lab work: None  If you have labs (blood work) drawn today and your tests are  completely normal, you will receive your results only by: Marland Kitchen MyChart Message (if you have MyChart) OR . A paper copy in the mail If  you have any lab test that is abnormal or we need to change your treatment, we will call you to review the results.  Testing/Procedures: None  Follow-Up: You can follow up with our office as needed  Any Other Special Instructions Will Be Listed Below (If Applicable).       Signed, Daune Perch, NP  01/04/2018 10:25 AM    [place]

## 2018-01-04 NOTE — Patient Instructions (Signed)
Medication Instructions:  Your physician recommends that you continue on your current medications as directed. Please refer to the Current Medication list given to you today.  If you need a refill on your cardiac medications before your next appointment, please call your pharmacy.   Lab work: None  If you have labs (blood work) drawn today and your tests are completely normal, you will receive your results only by: . MyChart Message (if you have MyChart) OR . A paper copy in the mail If you have any lab test that is abnormal or we need to change your treatment, we will call you to review the results.  Testing/Procedures: None  Follow-Up: You can follow up with our office as needed  Any Other Special Instructions Will Be Listed Below (If Applicable).    

## 2018-01-06 NOTE — Progress Notes (Signed)
[name]   [phone] [phone]   Clinic Follow up Note   Patient Care Team: [name], [name], [name] as PCP - General (Physician Assistant) Sueanne Margarita, MD as PCP - [name] Access (Cardiology) Sueanne Margarita, MD as PCP - Cardiology (Cardiology) [name], MD as Consulting Physician (Orthopedic Surgery)  Date of Service:  01/07/2018  CHIEF COMPLAINT: F/u of metastatic right breast cancer  SUMMARY OF ONCOLOGIC HISTORY: Oncology History    Cancer Staging No matching staging information was found for the patient.       [name], right ([place])   11/1997 Cancer Diagnosis    Patient had 1.4 cm poorly differentiated right breast carcinoma diagnosed in Nov 1999 at age 16, 1/7 axillary nodes involved, ER/PR and HER 2 positive. She had mastectomy with the 7 axillary node evaluation, 4 cycles of adriamycin/ cytoxan followed by taxotere, then five years of tamoxifen thru June 2005 (no herceptin in 1999). She had bilateral oophorectomy in May 2005. She was briefly on aromatase inhibitor after tamoxifen, but discontinued this herself due to poor tolerance.    04/04/2015 Genetic Testing    Negative genetic testing on the breast/ovarian cancer pnael.  The Breast/Ovarian gene panel offered by [facility] includes sequencing and rearrangement analysis for the following 20 genes:  ATM, BARD1, BRCA1, BRCA2, BRIP1, CDH1, CHEK2, EPCAM, FANCC, MLH1, MSH2, MSH6, NBN, PALB2, PMS2, PTEN, RAD51C, RAD51D, TP53, and XRCC2.    06/26/2015 Pathology Results    Initial Path Report Bone, curettage, Left femur met, intramedullary subtroch tissue - METASTATIC ADENOCARCINOMA.  Estrogen Receptor: 95%, POSITIVE, STRONG STAINING INTENSITY Progesterone Receptor: 2%, POSITIVE, STRONG  HER2 (+), IHC 3+    06/26/2015 Surgery    Biopsy of metastatic tissue and stabilization with Affixus trochanteric nail, proximal and distal interlock of left  femur by Dr. [name]     06/28/2015 Imaging    CT Chest w/ Contrast 1. Extensive right internal mammary chain lymphadenopathy consistent with metastatic breast cancer. 2. Lytic metastases involving the posterior elements at T1 with probable nondisplaced pathologic fracture of the spinous process. 3. No other evidence of thoracic metastatic disease. 4. Trace bilateral pleural effusions with associated bibasilar atelectasis. 5. Indeterminate right thyroid nodule, not previously imaged. This could be evaluated with thyroid ultrasound as clinically warranted.    07/01/2015 Imaging    Bone Scan 1. Increased activity noted throughout the left femur. Although metastatic disease cannot be excluded, these changes are most likely secondary to prior surgery. 2. No other focal abnormalities identified to suggest metastatic disease.    07/12/2015 - 07/26/2015 Radiation Therapy    Left femur, 30 Gy in 10 fractions by Dr. Sondra Come     07/14/2015 Initial Diagnosis    [name], right ([place])    07/19/2015 Imaging    Initial PET Scan 1. Severe multifocal osseous metastatic disease, much greater than anticipated based on prior imaging. 2. Multifocal nodal metastases involving the right internal mammary, prevascular and subpectoral lymph nodes. No axillary pulmonary involvement identified. 3. No distant extra osseous metastases.    07/30/2015 - 11/29/2015 Chemotherapy    Docetaxel, Herceptin and Perjeta every 3 weeks X6 cycles, pt tolerated moderately well, restaging scan showed excellent response     09/18/2015 Imaging    CT Head w/wo Contrast 1. No acute intracranial abnormality or significant interval change. 2. Stable minimal periventricular white matter hypoattenuation on the right. This may reflect a remote ischemic injury. 3. No  evidence for metastatic disease to the brain. 4. No focal soft tissue lesion to explain the patient's tenderness.    10/30/2015 Imaging    CT Abdomen  Pelvis w/ Contrast 1. Few mildly prominent fluid-filled loops of small bowel scattered throughout the abdomen, with intraluminal fluid density within the distal colon. Given the provided history, findings are suggestive of acute enteritis/diarrheal illness. 2. No other acute intra-abdominal or pelvic process identified. 3. Widespread osseous metastatic disease, better evaluated on most recent PET-CT from 07/19/2015. No pathologic fracture or other complication. 4. 11 mm hypodensity within the left kidney. This lesion measures intermediate density, and is indeterminate. While this lesion is similar in size relative to recent studies, this is increased in size relative to prior study from 2012. Further evaluation with dedicated renal mass protocol CT and/or MRI is recommended for complete characterization.    12/2015 - 05/14/2016 Chemotherapy    Maintenance Herceptin and pejeta every 3 weeks stopped on 05/14/16 due to disease progression    12/25/2015 Imaging    Restaging PET Scan 1. Markedly improved skeletal activity, with only several faint foci of residual accentuated metabolic activity, but resolution of the vast majority of the previously extensive osseous metastatic disease. 2. Resolution of the prior right internal mammary and right prevascular lymph nodes. 3. Residual subcutaneous edema and edema along fascia planes in the left upper thigh. This remains somewhat more than I would expect for placement of an IM nail 6 weeks ago. If there is leg swelling further down, consider Doppler venous ultrasound to rule out left lower extremity DVT. I do not perceive an obvious difference in density between the pelvic and common femoral veins on the noncontrast CT data. 4. Chronic right maxillary and right sphenoid sinusitis.    01/16/2016 - 10/20/2017 Anti-estrogen oral therapy    Letrozole 2.5 mg daily. She was switched to exemestane due to disease progression on 02/08/17. Stopped on 10/20/2017 when treatment  changed to chemo     03/01/2016 Miscellaneous    Patient presented to ED following a fall; she reports she landed on her back and is now experiencing back pain. The patient was evaluated and discharge home same day with pain control.    05/01/2016 Imaging    CT CAP IMPRESSION: 1. Stable exam.  No new or progressive disease identified. 2. No evidence for residual or recurrent adenopathy within the chest. 3. Stable bone metastasis.    05/01/2016 Imaging    BONE SCAN IMPRESSION: 1. Small focal uptake involving the anterior rib ends of the left second and right fourth ribs, new since the prior bone scan. The location of this uptake is more suggestive of a traumatic or inflammatory etiology as opposed to metastatic disease. No other evidence of metastatic disease. 2. There are other areas of stable uptake which are most likely degenerative/reactive, including right shoulder and cervical spine uptake and uptake along the right femur adjacent to the ORIF hardware.    05/20/2016 Imaging    NM PET Skull to Thigh IMPRESSION: 1. There multiple metastatic foci in the bony pelvis, including some new abnormal foci compare to the prior PET-CT, compatible with mildly progressive bony metastatic disease. Also, a left T11 vertebral hypermetabolic metastatic lesion is observed, new compared to the prior exam. 2. No active extraosseous malignancy is currently identified. 3. Right anterior fourth rib healing fracture, appears be    06/04/2016 - 01/08/2017 Chemotherapy    Second line chemotherapy Kadcyla every 3 weeks started on 06/04/16 and stopped on 01/08/17 due  to mixed response.      10/05/2016 Imaging    CT CAP and Bone Scan:  Bones: left intramedullary rod and left femoral head neck screw in place. In the proximal diaphysis of the left femur there is cortical thickening and irregularity. No lytic or blastic bone lesions. No fracture or vertebral endplate destruction.   No definite evidence of  recurrent disease in the chest, abdomen, or pelvis.    10/28/2016 Imaging    CT A/P IMPRESSION: No acute process demonstrated in the abdomen or pelvis. No evidence of bowel obstruction or inflammation.    11/04/2016 Imaging    DG foot complete left: IMPRESSION: No fracture or dislocation.  No soft tissue abnormality    11/23/2016 Imaging    CT RIGHT FEMUR IMPRESSION: Negative CT scan of the right thigh.  No visible metastatic disease.  CT LEFT FEMUR IMPRESSION: 1. Postsurgical changes related to prior cephalomedullary rod fixation of the left femur with linear lucency along the anterolateral proximal femoral cortex at level of the lesser trochanter, suspicious for nondisplaced fracture. 2. Slightly permeative appearance of the proximal left femoral cortex at the site of known prior osseous metastases is largely unchanged. 3. Unchanged patchy lucency and sclerosis along the anterior acetabulum, which appears to correspond with an area of faint uptake on the prior PET-CT, suspicious for osseous metastasis. These results will be called to the ordering clinician or representative by the Radiologist Assistant, and communication documented in the PACS or zVision Dashboard.      01/27/2017 Imaging    IMPRESSION: 1. Mixed response to osseous metastasis. Some hypermetabolic lesions are new and increasingly hypermetabolic, while another index lesion is decreasingly hypermetabolic. 2. No evidence of hypermetabolic soft tissue metastasis.     02/08/2017 - 10/19/2017 Antibody Plan    -Herceptin every 3 week since 02/08/17 -Oral Lanpantinib 1584m and decreased to 5073mdue to diarrhea and depression. Due to disease progression she swithced to Verzenio 15020mn 05/04/17. She did not toelrate well so she was switched to Ibrance 100m46m 05/31/17. Due to her neutropenia, Ibrance dose reduced to 75 mg daily, 3 weeks on, one-week off, stopped on 10/20/2017 due to disease progression.      04/30/2017 PET scan    IMPRESSION: 1. Primarily increase in hypermetabolism of osseous metastasis. An isolated right acetabular lesion is decreased in hypermetabolism since the prior exam. 2. No evidence of hypermetabolic soft tissue metastasis. 3. Similar non FDG avid right-sided thyroid nodule, favoring a benign etiology. Recommend attention on follow-up.    08/30/2017 PET scan    08/30/2017 PET Scan  IMPRESSION: 1. Worsening osseous metastatic disease. 2. Focal hypermetabolism in the central spinal canal the L1 level, similar to the prior exam. Metastatic implant cannot be excluded. 3. Slight enlargement of a right thyroid nodule which now appears more cystic in character. Consider further evaluation with thyroid ultrasound. If patient is clinically hyperthyroid, consider nuclear medicine thyroid uptake and scan    08/30/2017 Progression    08/30/2017 PET Scan  IMPRESSION: 1. Worsening osseous metastatic disease. 2. Focal hypermetabolism in the central spinal canal the L1 level, similar to the prior exam. Metastatic implant cannot be excluded. 3. Slight enlargement of a right thyroid nodule which now appears more cystic in character. Consider further evaluation with thyroid ultrasound. If patient is clinically hyperthyroid, consider nuclear medicine thyroid uptake and scan    09/29/2017 - 10/11/2017 Radiation Therapy    Pt received 27 Gy in 9 fractions directed at the areas of significant uptake  noted on recent bone scan the right pelvis and right proximal femur, with Dr, Sondra Come     10/19/2017 -  Chemotherapy    -Herceptin every 3 weeks starting 02/08/17, perjeta added on 10/20/2017  -weekly Taxol started on 10/21/2017     10/27/2017 Imaging    10/27/2017 CT AP IMPRESSION: 1. No acute abnormality. No evidence of bowel obstruction or acute bowel inflammation. Normal appendix. 2. Stable patchy sclerotic osseous metastases throughout the visualized skeleton. No new or progressive  metastatic disease in the abdomen or pelvis.      CURRENT THERAPY:  Weekly Taxol, Herceptin and Perjeta every 3 weeks  INTERVAL HISTORY:  [name] is here for a follow up of her metastatic right breast cancer and ongoing treatment. She presents to the clinic today with her significant other and her interpretor. She notes she has been sick with URI for 2-3 days with N&V and cough and congestion. She had some chill. She has been afebrile. Her chest hurt from coughing and vomiting. She has not taken any medications. She also has had diarrhea.  She has not been able to schedule scans yet.     REVIEW OF SYSTEMS:   Constitutional: Denies fevers, chills or abnormal weight loss Eyes: Denies blurriness of vision Ears, nose, mouth, throat, and face: Denies mucositis or sore throat (+) clear rhinorrhea  Respiratory: Denies dyspnea or wheezes (+) Cough with congestion  Cardiovascular: Denies palpitation, chest discomfort or lower extremity swelling Gastrointestinal:  Denies heartburn (+) N&V (+) diarrhea Skin: Denies abnormal skin rashes Lymphatics: Denies new lymphadenopathy or easy bruising Neurological:Denies numbness, tingling or new weaknesses Behavioral/Psych: Mood is stable, no new changes  All other systems were reviewed with the patient and are negative.  MEDICAL HISTORY:  Past Medical History:  Diagnosis Date  . Abnormal Pap smear   . Anxiety   . Breast cancer ([place])   . Cancer ([place]) 1999   breast-s/p mastectomy, chemo, rad  . CIN I (cervical intraepithelial neoplasia I) 2003   by colpo  . Congenital deafness   . Deaf   . Depression   . Port-A-Cath in place 2017   for chemotherapy  . Radiation 07/12/15-07/26/15   left femur 30 Gy  . S/P bilateral oophorectomy     SURGICAL HISTORY: Past Surgical History:  Procedure Laterality Date  . ABDOMINAL HYSTERECTOMY    . INTRAMEDULLARY (IM) NAIL INTERTROCHANTERIC Left 06/26/2015   Procedure: LEFT BIOMET LONG AFFIXS NAIL;   Surgeon: [name], MD;  Location: [place];  Service: Orthopedics;  Laterality: Left;  . IR GENERIC HISTORICAL  08/06/2015   IR US GUIDE VASC ACCESS LEFT 08/06/2015 Aletta Edouard, MD [facility]  . IR GENERIC HISTORICAL  08/06/2015   IR FLUORO GUIDE CV LINE LEFT 08/06/2015 Aletta Edouard, MD [facility]  . IR GENERIC HISTORICAL  03/05/2016   IR CV LINE INJECTION 03/05/2016 Markus Daft, MD [facility]  . LEFT HEART CATH AND CORONARY ANGIOGRAPHY N/A 12/13/2017   Procedure: LEFT HEART CATH AND CORONARY ANGIOGRAPHY;  Surgeon: Leonie Man, MD;  Location: [facility];  Service: Cardiovascular;  Laterality: N/A;  . MASTECTOMY     right breast  . MASTECTOMY    . OVARIAN CYST REMOVAL    . RADIOLOGY WITH ANESTHESIA Left 06/25/2015   Procedure: MRI OF LEFT HIP WITH OR WITHOUT CONTRAST;  Surgeon: Medication Radiologist, MD;  Location: [place];  Service: Radiology;  Laterality: Left;  DR. MCINTYRE/MRI  . TUBAL LIGATION  I have reviewed the social history and family history with the patient and they are unchanged from previous note.  ALLERGIES:  is allergic to chlorhexidine; promethazine hcl; and diphenhydramine hcl.  MEDICATIONS:  Current Outpatient Medications  Medication Sig Dispense Refill  . ALPRAZolam (XANAX) 0.25 MG tablet Take 1 tablet (0.25 mg total) by mouth 2 (two) times daily as needed for anxiety. 30 tablet 0  . cyclobenzaprine (FLEXERIL) 5 MG tablet Take 1 tablet (5 mg total) by mouth 3 (three) times daily as needed for muscle spasms. 30 tablet 0  . divalproex (DEPAKOTE) 250 MG DR tablet Take 1 tablet (250 mg total) by mouth at bedtime. 30 tablet 2  . HYDROcodone-acetaminophen (NORCO) 10-325 MG tablet Take 1 tablet by mouth every 6 (six) hours as needed. (Patient taking differently: Take 1 tablet by mouth every 6 (six) hours as needed (pain). ) 45 tablet 0  . lidocaine-prilocaine (EMLA) cream Apply 1 application topically as needed. Apply to Porta-Cath 1-2 hours prior to  access as directed. 30 g 2  . Multiple Vitamin (MULTIVITAMIN WITH MINERALS) TABS tablet Take 1 tablet by mouth daily.    . pantoprazole (PROTONIX) 20 MG tablet Take 1 tablet (20 mg total) by mouth daily. 60 tablet 0  . prochlorperazine (COMPAZINE) 10 MG tablet Take 1 tablet (10 mg total) by mouth every 6 (six) hours as needed for nausea or vomiting. 30 tablet 0  . HYDROcodone-homatropine (HYCODAN) 5-1.5 MG/5ML syrup Take 5 mLs by mouth every 6 (six) hours as needed for cough. 120 mL 0  . potassium chloride (K-DUR) 10 MEQ tablet Take 2 tablets (20 mEq total) by mouth 2 (two) times daily. 180 tablet 3   Current Facility-Administered Medications  Medication Dose Route Frequency Provider Last Rate Last Dose  . ondansetron (ZOFRAN) 8 mg in sodium chloride 0.9 % 50 mL IVPB  8 mg Intravenous Once Truitt Merle, MD      . sodium chloride 0.9 % 1,000 mL with potassium chloride 20 mEq infusion   Intravenous Continuous Truitt Merle, MD   Stopped at 01/07/18 1410   Facility-Administered Medications Ordered in Other Visits  Medication Dose Route Frequency Provider Last Rate Last Dose  . sodium chloride 0.9 % 1,000 mL with potassium chloride 10 mEq infusion   Intravenous Continuous Gordy Levan, MD   Stopped at 09/10/15 1653  . sodium chloride 0.9 % injection 10 mL  10 mL Intravenous PRN Livesay, Lennis P, MD        PHYSICAL EXAMINATION: ECOG PERFORMANCE STATUS: 2 - Symptomatic, <50% confined to bed  Vitals:   01/07/18 1050  BP: 123/66  Pulse: 94  Resp: 20  Temp: 98.4 F (36.9 C)  SpO2: 100%   Filed Weights   01/07/18 1050  Weight: 130 lb (59 kg)    GENERAL:alert, no distress and comfortable SKIN: skin color, texture, turgor are normal, no rashes or significant lesions EYES: normal, Conjunctiva are pink and non-injected, sclera clear OROPHARYNX:no exudate, no erythema and lips, buccal mucosa, and tongue normal  NECK: supple, thyroid normal size, non-tender, without nodularity LYMPH:  no  palpable lymphadenopathy in the cervical, axillary or inguinal LUNGS: clear to auscultation and percussion with normal breathing effort HEART: regular rate & rhythm and no murmurs and no lower extremity edema ABDOMEN:abdomen soft, non-tender and normal bowel sounds Musculoskeletal:no cyanosis of digits and no clubbing  NEURO: alert & oriented x 3 with fluent speech, no focal motor/sensory deficits  LABORATORY DATA:  I have reviewed the data as listed  CBC Latest Ref Rng & Units 01/07/2018 12/24/2017 12/17/2017  WBC 4.0 - 10.5 K/uL 6.5 3.2(L) 5.9  Hemoglobin 12.0 - 15.0 g/dL 9.6(L) 10.5(L) 10.1(L)  Hematocrit 36.0 - 46.0 % 28.8(L) 31.8(L) 30.5(L)  Platelets 150 - 400 K/uL 181 257 182     CMP Latest Ref Rng & Units 01/07/2018 12/24/2017 12/17/2017  Glucose 70 - 99 mg/dL 101(H) 92 85  BUN 6 - 20 mg/dL 7 12 14   Creatinine 0.44 - 1.00 mg/dL 0.79 0.85 0.83  Sodium 135 - 145 mmol/L 142 138 140  Potassium 3.5 - 5.1 mmol/L 2.6(LL) 3.6 3.6  Chloride 98 - 111 mmol/L 106 108 104  CO2 22 - 32 mmol/L 29 21(L) 24  Calcium 8.9 - 10.3 mg/dL 8.9 8.7(L) 9.3  Total Protein 6.5 - 8.1 g/dL 6.3(L) 6.9 6.3(L)  Total Bilirubin 0.3 - 1.2 mg/dL 2.9(H) 2.5(H) 1.7(H)  Alkaline Phos 38 - 126 U/L 66 69 64  AST 15 - 41 U/L 14(L) 35 19  ALT 0 - 44 U/L 14 26 33      RADIOGRAPHIC STUDIES: I have personally reviewed the radiological images as listed and agreed with the findings in the report. No results found.   ASSESSMENT & PLAN:  CHELSE MATAS is a 52 y.o. 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  -She is currently on weekly Taxol, Herceptin and Perjeta q3weeks, tolerating moderately well, with fatigue,decreased appetite, taste change, and hair loss. -She will reschedule CT  and Bone scan for restaging in the next 2 weeks.she declined oral contrast, but OK with IV CT contrast.  -Labs reviewed, No neutropenia, Hg at 9.6. Potassium is low at 2.6. I will post pone treatment this week due to her URI and will give IV fluids with antiemetics and potassium.  -Will proceed with Taxol, Herceptin/Perjeta next week if she recovers well.  -F/u in 2 weeks with scan results    2.Chest pain -She has beenevaluated by cardiologist Dr. Radford Pax who thinks after workup the patient's chest pain is not cardiac related. -Her chest pain has improved with Flexeril, worse this week due to cough  -likely muscular pain, will rule out bone mets on next scan   3. URI -2-3 days ago she developed cough, rhinorrhea with clear discharge and N&V and diarrhea which worsened her Chest pain.  -I encouraged her to take Robitussin for cough and congestion. I also called in Hycodan for her cough. She will get IV Fluids with antiemetics and potassium.  -Her exam was normal today, afebrile, will hold on antibiotics for now    4.Congenital deafness  5.Mild anemia, secondary to chemotherapy and malignancy -Stable, Hg at 9.6 today (01/07/18)  6.Hypokalemia -He has intermittent hyperkalemia, on oral supplement, continue  -Worsened to 2.6 with increase in N&V. Will give IV potassium today (01/07/18), I encourage her to continue oral KCL     7.Goal of care discussion  -she understand her goal of care is palliative to prolong her life -she is full code for now   PLAN:  -No treatment today due to cold symptoms, will give NS 1L and KCL 14mq -she will return in one week for chemo if recovers well  -F/u with me in 2 weeks with CT CAP w contrast and bone scan a few days before, no oral CT contrast per pt's request  No problem-specific Assessment & Plan notes found for this encounter.   Orders Placed This Encounter  Procedures  . CT Abdomen Pelvis W Contrast    Standing Status:    Future    Standing Expiration Date:   01/07/2019    Order Specific Question:   If indicated for the ordered procedure, I authorize the administration of contrast media per Radiology protocol    Answer:   Yes    Order Specific Question:   Is patient pregnant?    Answer:   No    Order Specific Question:   Preferred imaging location?    Answer:   [facility]    Order Specific Question:   Is Oral Contrast requested for this exam?    Answer:   Yes, Per Radiology protocol    Order Specific Question:   Radiology Contrast Protocol - do NOT remove file path    Answer:   \\[email or web address]  . CT Chest W Contrast    Standing Status:   Future    Standing Expiration Date:   01/08/2019    Order Specific Question:   If indicated for the ordered procedure, I authorize the administration of contrast media per Radiology protocol    Answer:   Yes    Order Specific Question:   Is patient pregnant?    Answer:   No    Order Specific Question:   Preferred imaging location?    Answer:   [facility]    Order Specific Question:   Radiology Contrast Protocol - do NOT remove file path    Answer:   \\[email or web address]   All questions were answered. The patient knows to call the clinic with any problems, questions or concerns. No barriers to learning was detected. I spent 20 minutes counseling the patient face to face. The total time spent in the appointment was 25 minutes and more than 50% was on counseling and review of test results     Truitt Merle, MD 01/07/2018   I, Joslyn Devon, am acting as scribe for Truitt Merle, MD.   I have reviewed the above documentation for accuracy and completeness, and I agree with the above.

## 2018-01-07 ENCOUNTER — Inpatient Hospital Stay: Payer: Medicaid Other

## 2018-01-07 ENCOUNTER — Other Ambulatory Visit: Payer: Medicaid Other

## 2018-01-07 ENCOUNTER — Telehealth: Payer: Self-pay

## 2018-01-07 ENCOUNTER — Inpatient Hospital Stay (HOSPITAL_BASED_OUTPATIENT_CLINIC_OR_DEPARTMENT_OTHER): Payer: Medicaid Other | Admitting: Hematology

## 2018-01-07 ENCOUNTER — Encounter: Payer: Self-pay | Admitting: Hematology

## 2018-01-07 VITALS — BP 123/66 | HR 94 | Temp 98.4°F | Resp 20 | Ht 65.0 in | Wt 130.0 lb

## 2018-01-07 DIAGNOSIS — Z95828 Presence of other vascular implants and grafts: Secondary | ICD-10-CM

## 2018-01-07 DIAGNOSIS — Z79811 Long term (current) use of aromatase inhibitors: Secondary | ICD-10-CM

## 2018-01-07 DIAGNOSIS — C7951 Secondary malignant neoplasm of bone: Secondary | ICD-10-CM

## 2018-01-07 DIAGNOSIS — R112 Nausea with vomiting, unspecified: Secondary | ICD-10-CM

## 2018-01-07 DIAGNOSIS — Z170 Estrogen receptor positive status [ER+]: Secondary | ICD-10-CM | POA: Diagnosis not present

## 2018-01-07 DIAGNOSIS — J069 Acute upper respiratory infection, unspecified: Secondary | ICD-10-CM

## 2018-01-07 DIAGNOSIS — R140 Abdominal distension (gaseous): Secondary | ICD-10-CM

## 2018-01-07 DIAGNOSIS — R079 Chest pain, unspecified: Secondary | ICD-10-CM | POA: Diagnosis not present

## 2018-01-07 DIAGNOSIS — Z923 Personal history of irradiation: Secondary | ICD-10-CM | POA: Diagnosis not present

## 2018-01-07 DIAGNOSIS — Z79899 Other long term (current) drug therapy: Secondary | ICD-10-CM

## 2018-01-07 DIAGNOSIS — D6481 Anemia due to antineoplastic chemotherapy: Secondary | ICD-10-CM | POA: Diagnosis not present

## 2018-01-07 DIAGNOSIS — G4700 Insomnia, unspecified: Secondary | ICD-10-CM

## 2018-01-07 DIAGNOSIS — H905 Unspecified sensorineural hearing loss: Secondary | ICD-10-CM | POA: Diagnosis not present

## 2018-01-07 DIAGNOSIS — E876 Hypokalemia: Secondary | ICD-10-CM

## 2018-01-07 DIAGNOSIS — C50919 Malignant neoplasm of unspecified site of unspecified female breast: Secondary | ICD-10-CM

## 2018-01-07 DIAGNOSIS — C50911 Malignant neoplasm of unspecified site of right female breast: Secondary | ICD-10-CM

## 2018-01-07 DIAGNOSIS — F419 Anxiety disorder, unspecified: Secondary | ICD-10-CM

## 2018-01-07 DIAGNOSIS — C799 Secondary malignant neoplasm of unspecified site: Principal | ICD-10-CM

## 2018-01-07 DIAGNOSIS — Z5112 Encounter for antineoplastic immunotherapy: Secondary | ICD-10-CM | POA: Diagnosis not present

## 2018-01-07 LAB — CBC WITH DIFFERENTIAL/PLATELET
Abs Immature Granulocytes: 0.04 10*3/uL (ref 0.00–0.07)
Basophils Absolute: 0 10*3/uL (ref 0.0–0.1)
Basophils Relative: 0 %
Eosinophils Absolute: 0 10*3/uL (ref 0.0–0.5)
Eosinophils Relative: 0 %
HCT: 28.8 % — ABNORMAL LOW (ref 36.0–46.0)
Hemoglobin: 9.6 g/dL — ABNORMAL LOW (ref 12.0–15.0)
Immature Granulocytes: 1 %
Lymphocytes Relative: 11 %
Lymphs Abs: 0.7 10*3/uL (ref 0.7–4.0)
MCH: 31 pg (ref 26.0–34.0)
MCHC: 33.3 g/dL (ref 30.0–36.0)
MCV: 92.9 fL (ref 80.0–100.0)
Monocytes Absolute: 0.7 10*3/uL (ref 0.1–1.0)
Monocytes Relative: 10 %
Neutro Abs: 5.1 10*3/uL (ref 1.7–7.7)
Neutrophils Relative %: 78 %
Platelets: 181 10*3/uL (ref 150–400)
RBC: 3.1 MIL/uL — AB (ref 3.87–5.11)
RDW: 14.3 % (ref 11.5–15.5)
WBC: 6.5 10*3/uL (ref 4.0–10.5)
nRBC: 0 % (ref 0.0–0.2)

## 2018-01-07 LAB — COMPREHENSIVE METABOLIC PANEL
ALT: 14 U/L (ref 0–44)
AST: 14 U/L — AB (ref 15–41)
Albumin: 3.4 g/dL — ABNORMAL LOW (ref 3.5–5.0)
Alkaline Phosphatase: 66 U/L (ref 38–126)
Anion gap: 7 (ref 5–15)
BUN: 7 mg/dL (ref 6–20)
CHLORIDE: 106 mmol/L (ref 98–111)
CO2: 29 mmol/L (ref 22–32)
CREATININE: 0.79 mg/dL (ref 0.44–1.00)
Calcium: 8.9 mg/dL (ref 8.9–10.3)
GFR calc Af Amer: >60 mL/min (ref >60)
GFR calc non Af Amer: >60 mL/min (ref >60)
Glucose, Bld: 101 mg/dL — ABNORMAL HIGH (ref 70–99)
Potassium: 2.6 mmol/L — CL (ref 3.5–5.1)
Sodium: 142 mmol/L (ref 135–145)
Total Bilirubin: 2.9 mg/dL — ABNORMAL HIGH (ref 0.3–1.2)
Total Protein: 6.3 g/dL — ABNORMAL LOW (ref 6.5–8.1)

## 2018-01-07 MED ORDER — SODIUM CHLORIDE 0.9 % IV SOLN
INTRAVENOUS | Status: DC
  Administered 2018-01-07: 12:00:00 via INTRAVENOUS
  Filled 2018-01-07 (×2): qty 1000

## 2018-01-07 MED ORDER — HYDROCODONE-HOMATROPINE 5-1.5 MG/5ML PO SYRP: 5.0000 mL | ORAL_SOLUTION | Freq: Four times a day (QID) | ORAL | 0 refills | Status: DC | PRN

## 2018-01-07 MED ORDER — HEPARIN SOD (PORK) LOCK FLUSH 100 UNIT/ML IV SOLN
500.0000 [IU] | Freq: Once | INTRAVENOUS | Status: AC
  Administered 2018-01-07: 500 [IU] via INTRAVENOUS
  Filled 2018-01-07: qty 5

## 2018-01-07 MED ORDER — ONDANSETRON HCL 4 MG/2ML IJ SOLN
INTRAMUSCULAR | Status: AC
  Filled 2018-01-07: qty 4

## 2018-01-07 MED ORDER — ONDANSETRON HCL 4 MG/2ML IJ SOLN
8.0000 mg | Freq: Once | INTRAMUSCULAR | Status: AC
  Administered 2018-01-07: 8 mg via INTRAVENOUS

## 2018-01-07 MED ORDER — SODIUM CHLORIDE 0.9 % IV SOLN
8.0000 mg | Freq: Once | INTRAVENOUS | Status: DC
  Filled 2018-01-07: qty 4

## 2018-01-07 MED ORDER — POTASSIUM CHLORIDE ER 10 MEQ PO TBCR: 20.0000 meq | EXTENDED_RELEASE_TABLET | Freq: Two times a day (BID) | ORAL | 3 refills | Status: DC

## 2018-01-07 MED ORDER — SODIUM CHLORIDE 0.9% FLUSH
10.0000 mL | Freq: Once | INTRAVENOUS | Status: AC
  Administered 2018-01-07: 10 mL
  Filled 2018-01-07: qty 10

## 2018-01-07 MED FILL — HYDROCODONE-HOMATROPINE SYR: 5-1.5 | 6 days supply | Qty: 120 | Fill #0

## 2018-01-07 NOTE — Progress Notes (Signed)
Pt tolerated IVF with K+ and IV zofran well.  States she feels better.  Eaten several soups and snacks, states her nausea is gone.  Interpreter and spouse present.

## 2018-01-07 NOTE — Telephone Encounter (Signed)
-----   Message from Dorothy Spark, MD sent at 01/07/2018  1:08 PM EST ----- Please call patient and advised to take 40 mEq of KCl today and tomorrow and then 20 mEq twice daily starting Sunday, December 29.  Repeat CMP in 1 week.

## 2018-01-07 NOTE — Telephone Encounter (Signed)
Notes recorded by Frederik Schmidt, RN on 01/07/2018 at 1:40 PM EST lpmtcb 12/27; I gave her the recommendations over the phone. She needs sign language interpreter.

## 2018-01-07 NOTE — Patient Instructions (Signed)

## 2018-01-08 ENCOUNTER — Encounter: Payer: Self-pay | Admitting: Hematology

## 2018-01-10 ENCOUNTER — Telehealth: Payer: Self-pay | Admitting: Hematology

## 2018-01-10 ENCOUNTER — Telehealth: Payer: Self-pay

## 2018-01-10 NOTE — Telephone Encounter (Signed)
-----   Message from Dorothy Spark, MD sent at 01/07/2018  1:08 PM EST ----- Please call patient and advised to take 40 mEq of KCl today and tomorrow and then 20 mEq twice daily starting Sunday, December 29.  Repeat CMP in 1 week.

## 2018-01-10 NOTE — Telephone Encounter (Signed)
Printed and mailed calendar of appointments per 12/27 los.

## 2018-01-10 NOTE — Telephone Encounter (Signed)
Notes recorded by Frederik Schmidt, RN on 01/10/2018 at 9:09 AM EST lpmtcb 12/30 (sign language) ------

## 2018-01-11 ENCOUNTER — Encounter (HOSPITAL_COMMUNITY)
Admission: RE | Admit: 2018-01-11 | Discharge: 2018-01-11 | Disposition: A | Discharge status: Home / Self Care | Payer: Medicaid Other | Source: Ambulatory Visit | Attending: Hematology | Admitting: Hematology

## 2018-01-11 ENCOUNTER — Ambulatory Visit (HOSPITAL_COMMUNITY)
Admission: RE | Admit: 2018-01-11 | Discharge: 2018-01-11 | Disposition: A | Discharge status: Home / Self Care | Payer: Medicaid Other | Source: Ambulatory Visit | Attending: Hematology | Admitting: Hematology

## 2018-01-11 ENCOUNTER — Telehealth: Payer: Self-pay

## 2018-01-11 DIAGNOSIS — C50911 Malignant neoplasm of unspecified site of right female breast: Secondary | ICD-10-CM | POA: Diagnosis present

## 2018-01-11 DIAGNOSIS — Z853 Personal history of malignant neoplasm of breast: Secondary | ICD-10-CM | POA: Diagnosis not present

## 2018-01-11 DIAGNOSIS — C7951 Secondary malignant neoplasm of bone: Secondary | ICD-10-CM | POA: Diagnosis not present

## 2018-01-11 DIAGNOSIS — R140 Abdominal distension (gaseous): Secondary | ICD-10-CM | POA: Diagnosis present

## 2018-01-11 DIAGNOSIS — C50919 Malignant neoplasm of unspecified site of unspecified female breast: Secondary | ICD-10-CM | POA: Diagnosis not present

## 2018-01-11 MED ORDER — IOPAMIDOL (ISOVUE-300) INJECTION 61%
100.0000 mL | Freq: Once | INTRAVENOUS | Status: AC | PRN
  Administered 2018-01-11: 100 mL via INTRAVENOUS

## 2018-01-11 MED ORDER — HEPARIN SOD (PORK) LOCK FLUSH 100 UNIT/ML IV SOLN
INTRAVENOUS | Status: AC
  Filled 2018-01-11: qty 5

## 2018-01-11 MED ORDER — TECHNETIUM TC 99M MEDRONATE IV KIT
20.9000 | PACK | Freq: Once | INTRAVENOUS | Status: AC | PRN
  Administered 2018-01-11: 20.9 via INTRAVENOUS

## 2018-01-11 MED ORDER — HEPARIN SOD (PORK) LOCK FLUSH 100 UNIT/ML IV SOLN
500.0000 [IU] | Freq: Once | INTRAVENOUS | Status: AC
  Administered 2018-01-11: 500 [IU] via INTRAVENOUS

## 2018-01-11 MED ORDER — SODIUM CHLORIDE (PF) 0.9 % IJ SOLN
INTRAMUSCULAR | Status: AC
  Filled 2018-01-11: qty 50

## 2018-01-11 NOTE — Telephone Encounter (Signed)
-----   Message from Dorothy Spark, MD sent at 01/07/2018  1:08 PM EST ----- Please call patient and advised to take 40 mEq of KCl today and tomorrow and then 20 mEq twice daily starting Sunday, December 29.  Repeat CMP in 1 week.

## 2018-01-11 NOTE — Telephone Encounter (Signed)
Notes recorded by Frederik Schmidt, RN on 01/11/2018 at 9:36 AM EST Tried to reach patient, but had to leave a message to call us back. Reviewed potassium instructions and that we needed to schedule lab draw. ------

## 2018-01-13 ENCOUNTER — Telehealth: Payer: Self-pay

## 2018-01-13 NOTE — Telephone Encounter (Signed)
Left message for patient to come tomorrow 1/3 for lab work at 11:30, port flush 11:45 and treatment at 12:15. (call was made through hearing impaired service)      0

## 2018-01-13 NOTE — Telephone Encounter (Signed)
Patient calls through deaf interpreter wanting her results from her scans this past week.  Explained to her that the results have to be reviewed by Dr. Burr Medico and I am unable to give her any results.  Dr. Burr Medico will be back in the office on Monday 01/17/18 and I will message her that the patient is anxious and awaiting the results.  She verbalized an understanding.

## 2018-01-14 ENCOUNTER — Inpatient Hospital Stay: Payer: Medicaid Other | Attending: Hematology

## 2018-01-14 ENCOUNTER — Other Ambulatory Visit: Payer: Self-pay | Admitting: Oncology

## 2018-01-14 ENCOUNTER — Inpatient Hospital Stay: Payer: Medicaid Other

## 2018-01-14 VITALS — BP 108/57 | HR 83 | Temp 99.6°F | Resp 18

## 2018-01-14 DIAGNOSIS — Z90710 Acquired absence of both cervix and uterus: Secondary | ICD-10-CM | POA: Diagnosis not present

## 2018-01-14 DIAGNOSIS — R05 Cough: Secondary | ICD-10-CM | POA: Insufficient documentation

## 2018-01-14 DIAGNOSIS — G4700 Insomnia, unspecified: Secondary | ICD-10-CM | POA: Diagnosis not present

## 2018-01-14 DIAGNOSIS — C7951 Secondary malignant neoplasm of bone: Secondary | ICD-10-CM | POA: Insufficient documentation

## 2018-01-14 DIAGNOSIS — H905 Unspecified sensorineural hearing loss: Secondary | ICD-10-CM | POA: Insufficient documentation

## 2018-01-14 DIAGNOSIS — Z5111 Encounter for antineoplastic chemotherapy: Secondary | ICD-10-CM | POA: Diagnosis not present

## 2018-01-14 DIAGNOSIS — Z79899 Other long term (current) drug therapy: Secondary | ICD-10-CM | POA: Diagnosis not present

## 2018-01-14 DIAGNOSIS — Z923 Personal history of irradiation: Secondary | ICD-10-CM | POA: Diagnosis not present

## 2018-01-14 DIAGNOSIS — Z95828 Presence of other vascular implants and grafts: Secondary | ICD-10-CM

## 2018-01-14 DIAGNOSIS — C50919 Malignant neoplasm of unspecified site of unspecified female breast: Secondary | ICD-10-CM

## 2018-01-14 DIAGNOSIS — E876 Hypokalemia: Secondary | ICD-10-CM | POA: Insufficient documentation

## 2018-01-14 DIAGNOSIS — Z90722 Acquired absence of ovaries, bilateral: Secondary | ICD-10-CM | POA: Insufficient documentation

## 2018-01-14 DIAGNOSIS — E041 Nontoxic single thyroid nodule: Secondary | ICD-10-CM | POA: Diagnosis not present

## 2018-01-14 DIAGNOSIS — C50911 Malignant neoplasm of unspecified site of right female breast: Secondary | ICD-10-CM | POA: Diagnosis present

## 2018-01-14 DIAGNOSIS — R197 Diarrhea, unspecified: Secondary | ICD-10-CM | POA: Insufficient documentation

## 2018-01-14 DIAGNOSIS — R079 Chest pain, unspecified: Secondary | ICD-10-CM | POA: Diagnosis not present

## 2018-01-14 DIAGNOSIS — Z5112 Encounter for antineoplastic immunotherapy: Secondary | ICD-10-CM | POA: Insufficient documentation

## 2018-01-14 DIAGNOSIS — Z79811 Long term (current) use of aromatase inhibitors: Secondary | ICD-10-CM | POA: Insufficient documentation

## 2018-01-14 DIAGNOSIS — D6481 Anemia due to antineoplastic chemotherapy: Secondary | ICD-10-CM | POA: Insufficient documentation

## 2018-01-14 DIAGNOSIS — C799 Secondary malignant neoplasm of unspecified site: Secondary | ICD-10-CM

## 2018-01-14 LAB — COMPREHENSIVE METABOLIC PANEL
ALT: 10 U/L (ref 0–44)
AST: 13 U/L — ABNORMAL LOW (ref 15–41)
Albumin: 3.3 g/dL — ABNORMAL LOW (ref 3.5–5.0)
Alkaline Phosphatase: 77 U/L (ref 38–126)
Anion gap: 8 (ref 5–15)
BUN: 7 mg/dL (ref 6–20)
CO2: 28 mmol/L (ref 22–32)
CREATININE: 0.77 mg/dL (ref 0.44–1.00)
Calcium: 9.1 mg/dL (ref 8.9–10.3)
Chloride: 105 mmol/L (ref 98–111)
GFR calc Af Amer: >60 mL/min (ref >60)
GFR calc non Af Amer: >60 mL/min (ref >60)
Glucose, Bld: 89 mg/dL (ref 70–99)
Potassium: 3.6 mmol/L (ref 3.5–5.1)
Sodium: 141 mmol/L (ref 135–145)
Total Bilirubin: 1.7 mg/dL — ABNORMAL HIGH (ref 0.3–1.2)
Total Protein: 6.7 g/dL (ref 6.5–8.1)

## 2018-01-14 LAB — CBC WITH DIFFERENTIAL/PLATELET
Abs Immature Granulocytes: 0.15 10*3/uL — ABNORMAL HIGH (ref 0.00–0.07)
BASOS PCT: 1 %
Basophils Absolute: 0 10*3/uL (ref 0.0–0.1)
Eosinophils Absolute: 0.1 10*3/uL (ref 0.0–0.5)
Eosinophils Relative: 1 %
HCT: 29.7 % — ABNORMAL LOW (ref 36.0–46.0)
Hemoglobin: 9.7 g/dL — ABNORMAL LOW (ref 12.0–15.0)
Immature Granulocytes: 2 %
Lymphocytes Relative: 11 %
Lymphs Abs: 0.7 10*3/uL (ref 0.7–4.0)
MCH: 30.6 pg (ref 26.0–34.0)
MCHC: 32.7 g/dL (ref 30.0–36.0)
MCV: 93.7 fL (ref 80.0–100.0)
Monocytes Absolute: 0.6 10*3/uL (ref 0.1–1.0)
Monocytes Relative: 8 %
Neutro Abs: 5 10*3/uL (ref 1.7–7.7)
Neutrophils Relative %: 77 %
PLATELETS: 212 10*3/uL (ref 150–400)
RBC: 3.17 MIL/uL — ABNORMAL LOW (ref 3.87–5.11)
RDW: 13.8 % (ref 11.5–15.5)
WBC: 6.5 10*3/uL (ref 4.0–10.5)
nRBC: 0 % (ref 0.0–0.2)

## 2018-01-14 MED ORDER — FAMOTIDINE IN NACL 20-0.9 MG/50ML-% IV SOLN
INTRAVENOUS | Status: AC
  Filled 2018-01-14: qty 50

## 2018-01-14 MED ORDER — SODIUM CHLORIDE 0.9 % IV SOLN
420.0000 mg | Freq: Once | INTRAVENOUS | Status: AC
  Administered 2018-01-14: 420 mg via INTRAVENOUS
  Filled 2018-01-14: qty 14

## 2018-01-14 MED ORDER — DEXAMETHASONE SODIUM PHOSPHATE 10 MG/ML IJ SOLN
10.0000 mg | Freq: Once | INTRAMUSCULAR | Status: AC
  Administered 2018-01-14: 10 mg via INTRAVENOUS

## 2018-01-14 MED ORDER — SODIUM CHLORIDE 0.9% FLUSH
10.0000 mL | INTRAVENOUS | Status: DC | PRN
  Administered 2018-01-14: 10 mL via INTRAVENOUS
  Filled 2018-01-14: qty 10

## 2018-01-14 MED ORDER — DEXAMETHASONE SODIUM PHOSPHATE 10 MG/ML IJ SOLN
INTRAMUSCULAR | Status: AC
  Filled 2018-01-14: qty 1

## 2018-01-14 MED ORDER — ACETAMINOPHEN 325 MG PO TABS
650.0000 mg | ORAL_TABLET | Freq: Once | ORAL | Status: AC
  Administered 2018-01-14: 650 mg via ORAL

## 2018-01-14 MED ORDER — SODIUM CHLORIDE 0.9 % IV SOLN
Freq: Once | INTRAVENOUS | Status: AC
  Administered 2018-01-14: 14:00:00 via INTRAVENOUS
  Filled 2018-01-14: qty 250

## 2018-01-14 MED ORDER — HEPARIN SOD (PORK) LOCK FLUSH 100 UNIT/ML IV SOLN
500.0000 [IU] | Freq: Once | INTRAVENOUS | Status: AC | PRN
  Administered 2018-01-14: 500 [IU]
  Filled 2018-01-14: qty 5

## 2018-01-14 MED ORDER — SODIUM CHLORIDE 0.9% FLUSH
10.0000 mL | INTRAVENOUS | Status: DC | PRN
  Administered 2018-01-14: 10 mL
  Filled 2018-01-14: qty 10

## 2018-01-14 MED ORDER — ACETAMINOPHEN 325 MG PO TABS
ORAL_TABLET | ORAL | Status: AC
  Filled 2018-01-14: qty 2

## 2018-01-14 MED ORDER — TRASTUZUMAB CHEMO 150 MG IV SOLR
6.0000 mg/kg | Freq: Once | INTRAVENOUS | Status: AC
  Administered 2018-01-14: 336 mg via INTRAVENOUS
  Filled 2018-01-14: qty 16

## 2018-01-14 MED ORDER — SODIUM CHLORIDE 0.9 % IV SOLN
80.0000 mg/m2 | Freq: Once | INTRAVENOUS | Status: AC
  Administered 2018-01-14: 132 mg via INTRAVENOUS
  Filled 2018-01-14: qty 22

## 2018-01-14 MED ORDER — LORATADINE 10 MG PO TABS
ORAL_TABLET | ORAL | Status: AC
  Filled 2018-01-14: qty 1

## 2018-01-14 MED ORDER — FAMOTIDINE IN NACL 20-0.9 MG/50ML-% IV SOLN
20.0000 mg | Freq: Once | INTRAVENOUS | Status: AC
  Administered 2018-01-14: 20 mg via INTRAVENOUS

## 2018-01-14 MED ORDER — LORATADINE 10 MG PO TABS
10.0000 mg | ORAL_TABLET | Freq: Every day | ORAL | Status: DC
  Administered 2018-01-14: 10 mg via ORAL

## 2018-01-14 NOTE — Patient Instructions (Signed)
[facility] Discharge Instructions for Patients Receiving Chemotherapy  Today you received the following chemotherapy agents:   Taxol, Herceptin, transtuzamab.  To help prevent nausea and vomiting after your treatment, we encourage you to take your nausea medication as prescribed.   If you develop nausea and vomiting that is not controlled by your nausea medication, call the clinic.   BELOW ARE SYMPTOMS THAT SHOULD BE REPORTED IMMEDIATELY:  *FEVER GREATER THAN 100.5 F  *CHILLS WITH OR WITHOUT FEVER  NAUSEA AND VOMITING THAT IS NOT CONTROLLED WITH YOUR NAUSEA MEDICATION  *UNUSUAL SHORTNESS OF BREATH  *UNUSUAL BRUISING OR BLEEDING  TENDERNESS IN MOUTH AND THROAT WITH OR WITHOUT PRESENCE OF ULCERS  *URINARY PROBLEMS  *BOWEL PROBLEMS  UNUSUAL RASH Items with * indicate a potential emergency and should be followed up as soon as possible.  Feel free to call the clinic should you have any questions or concerns. The clinic phone number is [phone].  Please show the [place] at check-in to the Emergency Department and triage nurse.

## 2018-01-14 NOTE — Progress Notes (Signed)
Taxol due and will be given today per Dr. Benay Spice (Dr. Burr Medico on [facility]). Orders changed. Kennith Center, Pharm.D., CPP 01/14/2018@2 :34 PM

## 2018-01-16 NOTE — Telephone Encounter (Signed)
Please let pt know her CT showed stable disease, bone scan is difficult to compare with her last PET scan directly, but no new lesions. Thanks   Truitt Merle MD

## 2018-01-17 ENCOUNTER — Telehealth: Payer: Self-pay | Admitting: Hematology

## 2018-01-17 NOTE — Telephone Encounter (Signed)
Per 1/5 schedule message cxd 1/9 f/u and moved to 1/10 with lab/tx. YF will see patient in tx area. Spoke with husband.

## 2018-01-18 ENCOUNTER — Telehealth: Payer: Self-pay | Admitting: Hematology

## 2018-01-18 NOTE — Telephone Encounter (Signed)
Called to confirm appts per 1/5 sch message - pt is aware of appt date and time

## 2018-01-19 ENCOUNTER — Ambulatory Visit: Payer: Medicaid Other | Admitting: Nurse Practitioner

## 2018-01-19 NOTE — Progress Notes (Signed)
[name]   [phone]) [phone] [phone]) [phone]   Clinic Follow up Note   Patient Care Team: [name], [name], [name] as PCP - General (Physician Assistant) Sueanne Margarita, MD as PCP - [facility] (Cardiology) Sueanne Margarita, MD as PCP - Cardiology (Cardiology) [name], MD as Consulting Physician (Orthopedic Surgery)  Date of Service:  01/21/2018  CHIEF COMPLAINT: F/u of metastatic right breast cancer  SUMMARY OF ONCOLOGIC HISTORY: Oncology History    Cancer Staging No matching staging information was found for the patient.       [name], right ([place])   11/1997 Cancer Diagnosis    Patient had 1.4 cm poorly differentiated right breast carcinoma diagnosed in Nov 1999 at age 22, 1/7 axillary nodes involved, ER/PR and HER 2 positive. She had mastectomy with the 7 axillary node evaluation, 4 cycles of adriamycin/ cytoxan followed by taxotere, then five years of tamoxifen thru June 2005 (no herceptin in 1999). She had bilateral oophorectomy in May 2005. She was briefly on aromatase inhibitor after tamoxifen, but discontinued this herself due to poor tolerance.    04/04/2015 Genetic Testing    Negative genetic testing on the breast/ovarian cancer pnael.  The Breast/Ovarian gene panel offered by [employer] includes sequencing and rearrangement analysis for the following 20 genes:  ATM, BARD1, BRCA1, BRCA2, BRIP1, CDH1, CHEK2, EPCAM, FANCC, MLH1, MSH2, MSH6, NBN, PALB2, PMS2, PTEN, RAD51C, RAD51D, TP53, and XRCC2.    06/26/2015 Pathology Results    Initial Path Report Bone, curettage, Left femur met, intramedullary subtroch tissue - METASTATIC ADENOCARCINOMA.  Estrogen Receptor: 95%, POSITIVE, STRONG STAINING INTENSITY Progesterone Receptor: 2%, POSITIVE, STRONG  HER2 (+), IHC 3+    06/26/2015 Surgery    Biopsy of metastatic tissue and stabilization with Affixus trochanteric nail, proximal and distal interlock of left  femur by Dr. [name]     06/28/2015 Imaging    CT Chest w/ Contrast 1. Extensive right internal mammary chain lymphadenopathy consistent with metastatic breast cancer. 2. Lytic metastases involving the posterior elements at T1 with probable nondisplaced pathologic fracture of the spinous process. 3. No other evidence of thoracic metastatic disease. 4. Trace bilateral pleural effusions with associated bibasilar atelectasis. 5. Indeterminate right thyroid nodule, not previously imaged. This could be evaluated with thyroid ultrasound as clinically warranted.    07/01/2015 Imaging    Bone Scan 1. Increased activity noted throughout the left femur. Although metastatic disease cannot be excluded, these changes are most likely secondary to prior surgery. 2. No other focal abnormalities identified to suggest metastatic disease.    07/12/2015 - 07/26/2015 Radiation Therapy    Left femur, 30 Gy in 10 fractions by Dr. Sondra Come     07/14/2015 Initial Diagnosis    [name], right ([place])    07/19/2015 Imaging    Initial PET Scan 1. Severe multifocal osseous metastatic disease, much greater than anticipated based on prior imaging. 2. Multifocal nodal metastases involving the right internal mammary, prevascular and subpectoral lymph nodes. No axillary pulmonary involvement identified. 3. No distant extra osseous metastases.    07/30/2015 - 11/29/2015 Chemotherapy    Docetaxel, Herceptin and Perjeta every 3 weeks X6 cycles, pt tolerated moderately well, restaging scan showed excellent response     09/18/2015 Imaging    CT Head w/wo Contrast 1. No acute intracranial abnormality or significant interval change. 2. Stable minimal periventricular white matter hypoattenuation on the right. This may reflect a remote ischemic injury. 3. No  evidence for metastatic disease to the brain. 4. No focal soft tissue lesion to explain the patient's tenderness.    10/30/2015 Imaging    CT Abdomen  Pelvis w/ Contrast 1. Few mildly prominent fluid-filled loops of small bowel scattered throughout the abdomen, with intraluminal fluid density within the distal colon. Given the provided history, findings are suggestive of acute enteritis/diarrheal illness. 2. No other acute intra-abdominal or pelvic process identified. 3. Widespread osseous metastatic disease, better evaluated on most recent PET-CT from 07/19/2015. No pathologic fracture or other complication. 4. 11 mm hypodensity within the left kidney. This lesion measures intermediate density, and is indeterminate. While this lesion is similar in size relative to recent studies, this is increased in size relative to prior study from 2012. Further evaluation with dedicated renal mass protocol CT and/or MRI is recommended for complete characterization.    12/2015 - 05/14/2016 Chemotherapy    Maintenance Herceptin and pejeta every 3 weeks stopped on 05/14/16 due to disease progression    12/25/2015 Imaging    Restaging PET Scan 1. Markedly improved skeletal activity, with only several faint foci of residual accentuated metabolic activity, but resolution of the vast majority of the previously extensive osseous metastatic disease. 2. Resolution of the prior right internal mammary and right prevascular lymph nodes. 3. Residual subcutaneous edema and edema along fascia planes in the left upper thigh. This remains somewhat more than I would expect for placement of an IM nail 6 weeks ago. If there is leg swelling further down, consider Doppler venous ultrasound to rule out left lower extremity DVT. I do not perceive an obvious difference in density between the pelvic and common femoral veins on the noncontrast CT data. 4. Chronic right maxillary and right sphenoid sinusitis.    01/16/2016 - 10/20/2017 Anti-estrogen oral therapy    Letrozole 2.5 mg daily. She was switched to exemestane due to disease progression on 02/08/17. Stopped on 10/20/2017 when treatment  changed to chemo     03/01/2016 Miscellaneous    Patient presented to ED following a fall; she reports she landed on her back and is now experiencing back pain. The patient was evaluated and discharge home same day with pain control.    05/01/2016 Imaging    CT CAP IMPRESSION: 1. Stable exam.  No new or progressive disease identified. 2. No evidence for residual or recurrent adenopathy within the chest. 3. Stable bone metastasis.    05/01/2016 Imaging    BONE SCAN IMPRESSION: 1. Small focal uptake involving the anterior rib ends of the left second and right fourth ribs, new since the prior bone scan. The location of this uptake is more suggestive of a traumatic or inflammatory etiology as opposed to metastatic disease. No other evidence of metastatic disease. 2. There are other areas of stable uptake which are most likely degenerative/reactive, including right shoulder and cervical spine uptake and uptake along the right femur adjacent to the ORIF hardware.    05/20/2016 Imaging    NM PET Skull to Thigh IMPRESSION: 1. There multiple metastatic foci in the bony pelvis, including some new abnormal foci compare to the prior PET-CT, compatible with mildly progressive bony metastatic disease. Also, a left T11 vertebral hypermetabolic metastatic lesion is observed, new compared to the prior exam. 2. No active extraosseous malignancy is currently identified. 3. Right anterior fourth rib healing fracture, appears be    06/04/2016 - 01/08/2017 Chemotherapy    Second line chemotherapy Kadcyla every 3 weeks started on 06/04/16 and stopped on 01/08/17 due  to mixed response.      10/05/2016 Imaging    CT CAP and Bone Scan:  Bones: left intramedullary rod and left femoral head neck screw in place. In the proximal diaphysis of the left femur there is cortical thickening and irregularity. No lytic or blastic bone lesions. No fracture or vertebral endplate destruction.   No definite evidence of  recurrent disease in the chest, abdomen, or pelvis.    10/28/2016 Imaging    CT A/P IMPRESSION: No acute process demonstrated in the abdomen or pelvis. No evidence of bowel obstruction or inflammation.    11/04/2016 Imaging    DG foot complete left: IMPRESSION: No fracture or dislocation.  No soft tissue abnormality    11/23/2016 Imaging    CT RIGHT FEMUR IMPRESSION: Negative CT scan of the right thigh.  No visible metastatic disease.  CT LEFT FEMUR IMPRESSION: 1. Postsurgical changes related to prior cephalomedullary rod fixation of the left femur with linear lucency along the anterolateral proximal femoral cortex at level of the lesser trochanter, suspicious for nondisplaced fracture. 2. Slightly permeative appearance of the proximal left femoral cortex at the site of known prior osseous metastases is largely unchanged. 3. Unchanged patchy lucency and sclerosis along the anterior acetabulum, which appears to correspond with an area of faint uptake on the prior PET-CT, suspicious for osseous metastasis. These results will be called to the ordering clinician or representative by the Radiologist Assistant, and communication documented in the PACS or zVision Dashboard.      01/27/2017 Imaging    IMPRESSION: 1. Mixed response to osseous metastasis. Some hypermetabolic lesions are new and increasingly hypermetabolic, while another index lesion is decreasingly hypermetabolic. 2. No evidence of hypermetabolic soft tissue metastasis.     02/08/2017 - 10/19/2017 Antibody Plan    -Herceptin every 3 week since 02/08/17 -Oral Lanpantinib 1528m and decreased to 5070mdue to diarrhea and depression. Due to disease progression she swithced to Verzenio 15040mn 05/04/17. She did not toelrate well so she was switched to Ibrance 100m34m 05/31/17. Due to her neutropenia, Ibrance dose reduced to 75 mg daily, 3 weeks on, one-week off, stopped on 10/20/2017 due to disease progression.      04/30/2017 PET scan    IMPRESSION: 1. Primarily increase in hypermetabolism of osseous metastasis. An isolated right acetabular lesion is decreased in hypermetabolism since the prior exam. 2. No evidence of hypermetabolic soft tissue metastasis. 3. Similar non FDG avid right-sided thyroid nodule, favoring a benign etiology. Recommend attention on follow-up.    08/30/2017 PET scan    08/30/2017 PET Scan  IMPRESSION: 1. Worsening osseous metastatic disease. 2. Focal hypermetabolism in the central spinal canal the L1 level, similar to the prior exam. Metastatic implant cannot be excluded. 3. Slight enlargement of a right thyroid nodule which now appears more cystic in character. Consider further evaluation with thyroid ultrasound. If patient is clinically hyperthyroid, consider nuclear medicine thyroid uptake and scan    08/30/2017 Progression    08/30/2017 PET Scan  IMPRESSION: 1. Worsening osseous metastatic disease. 2. Focal hypermetabolism in the central spinal canal the L1 level, similar to the prior exam. Metastatic implant cannot be excluded. 3. Slight enlargement of a right thyroid nodule which now appears more cystic in character. Consider further evaluation with thyroid ultrasound. If patient is clinically hyperthyroid, consider nuclear medicine thyroid uptake and scan    09/29/2017 - 10/11/2017 Radiation Therapy    Pt received 27 Gy in 9 fractions directed at the areas of significant uptake  noted on recent bone scan the right pelvis and right proximal femur, with Dr, Sondra Come     10/19/2017 -  Chemotherapy    -Herceptin every 3 weeks starting 02/08/17, perjeta added on 10/20/2017  -weekly Taxol started on 10/21/2017. Will reduce Taxol to 2 weeks on, 1 week off starting on 02/04/17.     10/27/2017 Imaging    10/27/2017 CT AP IMPRESSION: 1. No acute abnormality. No evidence of bowel obstruction or acute bowel inflammation. Normal appendix. 2. Stable patchy sclerotic osseous  metastases throughout the visualized skeleton. No new or progressive metastatic disease in the abdomen or pelvis.    01/11/2018 Imaging    Whole Body Bone Scan 01/11/18  IMPRESSION: 1. Multifocal osseous metastases as described. 2. Right scapular metastasis. 3. Right superior thoracic spinous process. 4. Right sacral ala and right L5 metastases. 5. Right acetabular metastasis. 6. Right proximal femur metastasis. 7. Diffuse metastases throughout the left femur. 8. Anterior right fourth rib metastasis.    01/11/2018 Imaging    CT CAP W Contrast 01/11/18  IMPRESSION: 1. Similar-appearing osseous metastatic disease. 2. No evidence for progressed metastatic disease in the chest, abdomen or pelvis.      CURRENT THERAPY:  Weekly Taxol, Herceptin and Perjeta every 3 weeks. Will reduce Taxol to 2 weeks on, 1 week off starting on 02/04/17.   INTERVAL HISTORY:  [name] is here for a follow up to review her recent scans from 01/11/18. She presents to the clinic today with husband and interpretor. She notes she has been coughing still. She notes eyes going black and then vomited without complete syncope. Antiemetics helped. She notes mild numbness in her hands then went away after 10 minutes. She notes having mild numbness in left foot for a while and is stable. She notes her chest pain has improved. She notes her diarrhea is controlled now.  She plans to travel to [facility] in 3 weeks for 1 whole week. She would like to have time to do this but keep going on treatment.     REVIEW OF SYSTEMS:   Constitutional: Denies fevers, chills or abnormal weight loss Eyes: Denies blurriness of vision Ears, nose, mouth, throat, and face: Denies mucositis or sore throat Respiratory: Denies dyspnea or wheezes (+) Aggressive coughing  Cardiovascular: Denies palpitation or lower extremity swelling (+) improved chest pain  Gastrointestinal:  Denies heartburn (+) Controlled diarrhea (+) N &  V Skin: Denies abnormal skin rashes Lymphatics: Denies new lymphadenopathy or easy bruising Neurological:Denies tingling or new weaknesses (+) occasional numbness in hands (+) more constant left foot numbness  Behavioral/Psych: Mood is stable, no new changes  All other systems were reviewed with the patient and are negative.  MEDICAL HISTORY:  Past Medical History:  Diagnosis Date  . Abnormal Pap smear   . Anxiety   . Breast cancer ([place])   . Cancer ([place]) 1999   breast-s/p mastectomy, chemo, rad  . CIN I (cervical intraepithelial neoplasia I) 2003   by colpo  . Congenital deafness   . Deaf   . Depression   . Port-A-Cath in place 2017   for chemotherapy  . Radiation 07/12/15-07/26/15   left femur 30 Gy  . S/P bilateral oophorectomy     SURGICAL HISTORY: Past Surgical History:  Procedure Laterality Date  . ABDOMINAL HYSTERECTOMY    . INTRAMEDULLARY (IM) NAIL INTERTROCHANTERIC Left 06/26/2015   Procedure: LEFT BIOMET LONG AFFIXS NAIL;  Surgeon: [name], MD;  Location: [facility];  Service: Orthopedics;  Laterality: Left;  . IR GENERIC HISTORICAL  08/06/2015   IR US GUIDE VASC ACCESS LEFT 08/06/2015 Aletta Edouard, MD [facility]  . IR GENERIC HISTORICAL  08/06/2015   IR FLUORO GUIDE CV LINE LEFT 08/06/2015 Aletta Edouard, MD [facility]  . IR GENERIC HISTORICAL  03/05/2016   IR CV LINE INJECTION 03/05/2016 Markus Daft, MD [facility]  . LEFT HEART CATH AND CORONARY ANGIOGRAPHY N/A 12/13/2017   Procedure: LEFT HEART CATH AND CORONARY ANGIOGRAPHY;  Surgeon: Leonie Man, MD;  Location: [facility];  Service: Cardiovascular;  Laterality: N/A;  . MASTECTOMY     right breast  . MASTECTOMY    . OVARIAN CYST REMOVAL    . RADIOLOGY WITH ANESTHESIA Left 06/25/2015   Procedure: MRI OF LEFT HIP WITH OR WITHOUT CONTRAST;  Surgeon: Medication Radiologist, MD;  Location: [facility];  Service: Radiology;  Laterality: Left;  DR. MCINTYRE/MRI  . TUBAL LIGATION      I have reviewed the  social history and family history with the patient and they are unchanged from previous note.  ALLERGIES:  is allergic to chlorhexidine; promethazine hcl; and diphenhydramine hcl.  MEDICATIONS:  Current Outpatient Medications  Medication Sig Dispense Refill  . ALPRAZolam (XANAX) 0.25 MG tablet Take 1 tablet (0.25 mg total) by mouth 2 (two) times daily as needed for anxiety. 30 tablet 0  . cyclobenzaprine (FLEXERIL) 5 MG tablet Take 1 tablet (5 mg total) by mouth 3 (three) times daily as needed for muscle spasms. 30 tablet 0  . divalproex (DEPAKOTE) 250 MG DR tablet Take 1 tablet (250 mg total) by mouth at bedtime. 30 tablet 2  . HYDROcodone-acetaminophen (NORCO) 10-325 MG tablet Take 1 tablet by mouth every 6 (six) hours as needed. (Patient taking differently: Take 1 tablet by mouth every 6 (six) hours as needed (pain). ) 45 tablet 0  . HYDROcodone-homatropine (HYCODAN) 5-1.5 MG/5ML syrup Take 5 mLs by mouth every 6 (six) hours as needed for cough. 120 mL 0  . lidocaine-prilocaine (EMLA) cream Apply 1 application topically as needed. Apply to Porta-Cath 1-2 hours prior to access as directed. 30 g 2  . Multiple Vitamin (MULTIVITAMIN WITH MINERALS) TABS tablet Take 1 tablet by mouth daily.    . pantoprazole (PROTONIX) 20 MG tablet Take 1 tablet (20 mg total) by mouth daily. 60 tablet 0  . potassium chloride (K-DUR) 10 MEQ tablet Take 2 tablets (20 mEq total) by mouth 2 (two) times daily. 180 tablet 3  . prochlorperazine (COMPAZINE) 10 MG tablet Take 1 tablet (10 mg total) by mouth every 6 (six) hours as needed for nausea or vomiting. 30 tablet 0   No current facility-administered medications for this visit.    Facility-Administered Medications Ordered in Other Visits  Medication Dose Route Frequency Provider Last Rate Last Dose  . sodium chloride 0.9 % 1,000 mL with potassium chloride 10 mEq infusion   Intravenous Continuous Gordy Levan, MD   Stopped at 09/10/15 1653  . sodium chloride 0.9  % injection 10 mL  10 mL Intravenous PRN Livesay, Lennis P, MD        PHYSICAL EXAMINATION: ECOG PERFORMANCE STATUS: 1 - Symptomatic but completely ambulatory Blood pressure 112/71, heart rate 84, respirate 18, temperature 36.7, pulse ox 100% on room air GENERAL:alert, no distress and comfortable SKIN: skin color, texture, turgor are normal, no rashes or significant lesions EYES: normal, Conjunctiva are pink and non-injected, sclera clear OROPHARYNX:no exudate, no erythema and lips, buccal mucosa, and tongue normal  NECK: supple, thyroid normal size, non-tender, without nodularity LYMPH:  no palpable lymphadenopathy in the cervical, axillary or inguinal LUNGS: clear to auscultation and percussion with normal breathing effort HEART: regular rate & rhythm and no murmurs and no lower extremity edema ABDOMEN:abdomen soft, non-tender and normal bowel sounds Musculoskeletal:no cyanosis of digits and no clubbing  NEURO: alert & oriented x 3 with fluent speech, no focal motor/sensory deficits  LABORATORY DATA:  I have reviewed the data as listed CBC Latest Ref Rng & Units 01/21/2018 01/14/2018 01/07/2018  WBC 4.0 - 10.5 K/uL 4.9 6.5 6.5  Hemoglobin 12.0 - 15.0 g/dL 9.4(L) 9.7(L) 9.6(L)  Hematocrit 36.0 - 46.0 % 27.8(L) 29.7(L) 28.8(L)  Platelets 150 - 400 K/uL 260 212 181     CMP Latest Ref Rng & Units 01/21/2018 01/14/2018 01/07/2018  Glucose 70 - 99 mg/dL 75 89 101(H)  BUN 6 - 20 mg/dL 13 7 7   Creatinine 0.44 - 1.00 mg/dL 0.79 0.77 0.79  Sodium 135 - 145 mmol/L 138 141 142  Potassium 3.5 - 5.1 mmol/L 3.9 3.6 2.6(LL)  Chloride 98 - 111 mmol/L 106 105 106  CO2 22 - 32 mmol/L 24 28 29   Calcium 8.9 - 10.3 mg/dL 8.8(L) 9.1 8.9  Total Protein 6.5 - 8.1 g/dL 6.5 6.7 6.3(L)  Total Bilirubin 0.3 - 1.2 mg/dL 1.6(H) 1.7(H) 2.9(H)  Alkaline Phos 38 - 126 U/L 64 77 66  AST 15 - 41 U/L 11(L) 13(L) 14(L)  ALT 0 - 44 U/L 11 10 14       RADIOGRAPHIC STUDIES: I have personally reviewed the  radiological images as listed and agreed with the findings in the report. No results found.   ASSESSMENT & PLAN:  KEILIN GAMBOA is a 53 y.o. 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  -She is currently on weekly Taxol, Herceptin and Perjeta q3weeks, tolerating moderately well.  -She recently started developing very mild numbness in her hands. I suggest she start using ice bags during infusion to reduce developing neuropathy. She agreed to try ice bags on her hands.  -Her CT CAP and Bone scan from 01/11/18 shows stable disease with no new uptake or progression, although it's hard to compare bone scan to last PET scan directly. I have personally reviewed her scan images, and discussed with patient.  We will continue current treatment. -Given moderate tolerance of treatment and stable disease, I will reduce her Taxol to 2 weeks off and 1 week off and continue Herceptin/Perjeta q3weeks.  -Labs reviewed, Hg stable at 9.4, Ca 8.8, overall adequate to proceed with treatment today.  -She plans to travel to [facility] in a few weeks, so I rearranged her treatment schedule.  -F/u on 1/24   2.Chest pain -She has beenevaluated by cardiologist Dr. Radford Pax who thinks after workup the patient's chest pain is not cardiac related. -Her chest pain has improved with Flexeril, worse this week due to cough  -No bone metastasis in sternum seen on recent bone scan.  -Likely muscular. Much improved recently.   3. Cough  -She recently developed cough, rhinorrhea with clear discharge and N&V and diarrhea which worsened her Chest pain.  -She has Hycodan and can take Robitussin for cough and congestion.  -Lungs clear on exam today.  Recent CT chest was also negative. Still has  aggressive cough which  induces N&V. She will continue antiemetics. Will monitor.   4.Congenital deafness  5.Mild anemia, secondary to chemotherapy and malignancy -Stable, Hg stable at 9.4 today (01/21/2018)   6.Hypokalemia -He has intermittent hyperkalemia, on oral supplement KCL, continue 22mq BID -Normalized to 3.9 today (01/21/2018)  7.Goal of care discussion  -she understand her goal of care is palliative to prolong her life -she is full code for now     PLAN:  -Labs revewied and adequte to proceed with Taxol today, off treatment next week -she will start Taxol weekly 2 weeks on and one week off, and continue Herceptin/Perjeta every 3 weeks, starting in 2 weeks. -f/u in 2 weeks     No problem-specific Assessment & Plan notes found for this encounter.   No orders of the defined types were placed in this encounter.  All questions were answered. The patient knows to call the clinic with any problems, questions or concerns. No barriers to learning was detected. I spent 25 minutes counseling the patient face to face. The total time spent in the appointment was 30 minutes and more than 50% was on counseling and review of test results     YTruitt Merle MD 01/21/2018   I, AJoslyn Devon am acting as scribe for YTruitt Merle MD.   I have reviewed the above documentation for accuracy and completeness, and I agree with the above.

## 2018-01-20 ENCOUNTER — Ambulatory Visit: Payer: Medicaid Other | Admitting: Hematology

## 2018-01-21 ENCOUNTER — Encounter: Payer: Self-pay | Admitting: Hematology

## 2018-01-21 ENCOUNTER — Inpatient Hospital Stay: Payer: Medicaid Other

## 2018-01-21 ENCOUNTER — Inpatient Hospital Stay (HOSPITAL_BASED_OUTPATIENT_CLINIC_OR_DEPARTMENT_OTHER): Payer: Medicaid Other | Admitting: Hematology

## 2018-01-21 ENCOUNTER — Telehealth: Payer: Self-pay

## 2018-01-21 VITALS — BP 112/71 | HR 84 | Temp 98.1°F | Resp 18

## 2018-01-21 DIAGNOSIS — C799 Secondary malignant neoplasm of unspecified site: Principal | ICD-10-CM

## 2018-01-21 DIAGNOSIS — Z923 Personal history of irradiation: Secondary | ICD-10-CM

## 2018-01-21 DIAGNOSIS — C7951 Secondary malignant neoplasm of bone: Secondary | ICD-10-CM | POA: Diagnosis not present

## 2018-01-21 DIAGNOSIS — Z5111 Encounter for antineoplastic chemotherapy: Secondary | ICD-10-CM | POA: Diagnosis not present

## 2018-01-21 DIAGNOSIS — C50911 Malignant neoplasm of unspecified site of right female breast: Secondary | ICD-10-CM | POA: Diagnosis not present

## 2018-01-21 DIAGNOSIS — C50919 Malignant neoplasm of unspecified site of unspecified female breast: Secondary | ICD-10-CM

## 2018-01-21 DIAGNOSIS — Z95828 Presence of other vascular implants and grafts: Secondary | ICD-10-CM

## 2018-01-21 LAB — CBC WITH DIFFERENTIAL/PLATELET
Abs Immature Granulocytes: 0.16 10*3/uL — ABNORMAL HIGH (ref 0.00–0.07)
Basophils Absolute: 0 10*3/uL (ref 0.0–0.1)
Basophils Relative: 1 %
EOS ABS: 0.1 10*3/uL (ref 0.0–0.5)
Eosinophils Relative: 2 %
HCT: 27.8 % — ABNORMAL LOW (ref 36.0–46.0)
Hemoglobin: 9.4 g/dL — ABNORMAL LOW (ref 12.0–15.0)
Immature Granulocytes: 3 %
Lymphocytes Relative: 21 %
Lymphs Abs: 1 10*3/uL (ref 0.7–4.0)
MCH: 30.7 pg (ref 26.0–34.0)
MCHC: 33.8 g/dL (ref 30.0–36.0)
MCV: 90.8 fL (ref 80.0–100.0)
Monocytes Absolute: 0.4 10*3/uL (ref 0.1–1.0)
Monocytes Relative: 7 %
NRBC: 0 % (ref 0.0–0.2)
Neutro Abs: 3.3 10*3/uL (ref 1.7–7.7)
Neutrophils Relative %: 66 %
PLATELETS: 260 10*3/uL (ref 150–400)
RBC: 3.06 MIL/uL — AB (ref 3.87–5.11)
RDW: 13.5 % (ref 11.5–15.5)
WBC: 4.9 10*3/uL (ref 4.0–10.5)

## 2018-01-21 LAB — COMPREHENSIVE METABOLIC PANEL
ALT: 11 U/L (ref 0–44)
AST: 11 U/L — ABNORMAL LOW (ref 15–41)
Albumin: 3.4 g/dL — ABNORMAL LOW (ref 3.5–5.0)
Alkaline Phosphatase: 64 U/L (ref 38–126)
Anion gap: 8 (ref 5–15)
BUN: 13 mg/dL (ref 6–20)
CO2: 24 mmol/L (ref 22–32)
Calcium: 8.8 mg/dL — ABNORMAL LOW (ref 8.9–10.3)
Chloride: 106 mmol/L (ref 98–111)
Creatinine, Ser: 0.79 mg/dL (ref 0.44–1.00)
GFR calc Af Amer: >60 mL/min (ref >60)
Glucose, Bld: 75 mg/dL (ref 70–99)
POTASSIUM: 3.9 mmol/L (ref 3.5–5.1)
Sodium: 138 mmol/L (ref 135–145)
Total Bilirubin: 1.6 mg/dL — ABNORMAL HIGH (ref 0.3–1.2)
Total Protein: 6.5 g/dL (ref 6.5–8.1)

## 2018-01-21 MED ORDER — ZOLEDRONIC ACID 4 MG/100ML IV SOLN
4.0000 mg | Freq: Once | INTRAVENOUS | Status: AC
  Administered 2018-01-21: 4 mg via INTRAVENOUS
  Filled 2018-01-21: qty 100

## 2018-01-21 MED ORDER — SODIUM CHLORIDE 0.9 % IV SOLN
80.0000 mg/m2 | Freq: Once | INTRAVENOUS | Status: AC
  Administered 2018-01-21: 132 mg via INTRAVENOUS
  Filled 2018-01-21: qty 22

## 2018-01-21 MED ORDER — FAMOTIDINE IN NACL 20-0.9 MG/50ML-% IV SOLN
INTRAVENOUS | Status: AC
  Filled 2018-01-21: qty 50

## 2018-01-21 MED ORDER — FAMOTIDINE IN NACL 20-0.9 MG/50ML-% IV SOLN
20.0000 mg | Freq: Once | INTRAVENOUS | Status: AC
  Administered 2018-01-21: 20 mg via INTRAVENOUS

## 2018-01-21 MED ORDER — SODIUM CHLORIDE 0.9% FLUSH
10.0000 mL | Freq: Once | INTRAVENOUS | Status: AC
  Administered 2018-01-21: 10 mL
  Filled 2018-01-21: qty 10

## 2018-01-21 MED ORDER — DEXAMETHASONE SODIUM PHOSPHATE 10 MG/ML IJ SOLN
10.0000 mg | Freq: Once | INTRAMUSCULAR | Status: AC
  Administered 2018-01-21: 10 mg via INTRAVENOUS

## 2018-01-21 MED ORDER — DEXAMETHASONE SODIUM PHOSPHATE 10 MG/ML IJ SOLN
INTRAMUSCULAR | Status: AC
  Filled 2018-01-21: qty 1

## 2018-01-21 MED ORDER — SODIUM CHLORIDE 0.9% FLUSH
10.0000 mL | INTRAVENOUS | Status: DC | PRN
  Administered 2018-01-21: 10 mL
  Filled 2018-01-21: qty 10

## 2018-01-21 MED ORDER — SODIUM CHLORIDE 0.9 % IV SOLN
Freq: Once | INTRAVENOUS | Status: AC
  Administered 2018-01-21: 10:00:00 via INTRAVENOUS
  Filled 2018-01-21: qty 250

## 2018-01-21 MED ORDER — HEPARIN SOD (PORK) LOCK FLUSH 100 UNIT/ML IV SOLN
500.0000 [IU] | Freq: Once | INTRAVENOUS | Status: AC | PRN
  Administered 2018-01-21: 500 [IU]
  Filled 2018-01-21: qty 5

## 2018-01-21 NOTE — Telephone Encounter (Signed)
Error opening  

## 2018-01-21 NOTE — Telephone Encounter (Signed)
Printed avs and calender of upcoming appointment. Per 1/10 los 

## 2018-01-21 NOTE — Patient Instructions (Signed)
[name] Discharge Instructions for Patients Receiving Chemotherapy  Today you received the following chemotherapy agents: paclitaxel (Taxol).  To help prevent nausea and vomiting after your treatment, we encourage you to take your nausea medication as prescribed.  If you develop nausea and vomiting that is not controlled by your nausea medication, call the clinic.   BELOW ARE SYMPTOMS THAT SHOULD BE REPORTED IMMEDIATELY:  *FEVER GREATER THAN 100.5 F  *CHILLS WITH OR WITHOUT FEVER  NAUSEA AND VOMITING THAT IS NOT CONTROLLED WITH YOUR NAUSEA MEDICATION  *UNUSUAL SHORTNESS OF BREATH  *UNUSUAL BRUISING OR BLEEDING  TENDERNESS IN MOUTH AND THROAT WITH OR WITHOUT PRESENCE OF ULCERS  *URINARY PROBLEMS  *BOWEL PROBLEMS  UNUSUAL RASH Items with * indicate a potential emergency and should be followed up as soon as possible.  Feel free to call the clinic should you have any questions or concerns. The clinic phone number is [phone].  Please show the CHEMO ALERT CARD at check-in to the Emergency Department and triage nurse.   

## 2018-01-21 NOTE — Addendum Note (Signed)
Addended by: Margaret Pyle on: 01/21/2018 02:13 PM   Modules accepted: Orders

## 2018-01-22 LAB — CANCER ANTIGEN 27.29: CA 27.29: 35.5 U/mL (ref 0.0–38.6)

## 2018-01-24 ENCOUNTER — Telehealth: Payer: Self-pay | Admitting: Hematology

## 2018-01-24 NOTE — Telephone Encounter (Signed)
Called patient to confirm appt on 1/23 - left message for patient with appt date and time

## 2018-01-28 ENCOUNTER — Other Ambulatory Visit: Payer: Medicaid Other

## 2018-01-28 ENCOUNTER — Ambulatory Visit: Payer: Medicaid Other

## 2018-02-03 ENCOUNTER — Inpatient Hospital Stay: Payer: Medicaid Other

## 2018-02-03 ENCOUNTER — Other Ambulatory Visit: Payer: Medicaid Other

## 2018-02-03 ENCOUNTER — Ambulatory Visit: Payer: Medicaid Other | Admitting: Medical

## 2018-02-03 ENCOUNTER — Inpatient Hospital Stay: Payer: Medicaid Other | Admitting: Hematology

## 2018-02-03 ENCOUNTER — Telehealth: Payer: Self-pay

## 2018-02-03 ENCOUNTER — Encounter: Payer: Self-pay | Admitting: Hematology

## 2018-02-03 DIAGNOSIS — C50919 Malignant neoplasm of unspecified site of unspecified female breast: Secondary | ICD-10-CM

## 2018-02-03 DIAGNOSIS — C799 Secondary malignant neoplasm of unspecified site: Principal | ICD-10-CM

## 2018-02-03 DIAGNOSIS — Z5111 Encounter for antineoplastic chemotherapy: Secondary | ICD-10-CM | POA: Diagnosis not present

## 2018-02-03 LAB — CBC WITH DIFFERENTIAL/PLATELET
Abs Immature Granulocytes: 0.03 10*3/uL (ref 0.00–0.07)
Basophils Absolute: 0 10*3/uL (ref 0.0–0.1)
Basophils Relative: 0 %
EOS ABS: 0 10*3/uL (ref 0.0–0.5)
Eosinophils Relative: 1 %
HEMATOCRIT: 31.5 % — AB (ref 36.0–46.0)
Hemoglobin: 10.3 g/dL — ABNORMAL LOW (ref 12.0–15.0)
Immature Granulocytes: 1 %
Lymphocytes Relative: 15 %
Lymphs Abs: 0.6 10*3/uL — ABNORMAL LOW (ref 0.7–4.0)
MCH: 30.2 pg (ref 26.0–34.0)
MCHC: 32.7 g/dL (ref 30.0–36.0)
MCV: 92.4 fL (ref 80.0–100.0)
Monocytes Absolute: 0.4 10*3/uL (ref 0.1–1.0)
Monocytes Relative: 11 %
NRBC: 0 % (ref 0.0–0.2)
Neutro Abs: 2.7 10*3/uL (ref 1.7–7.7)
Neutrophils Relative %: 72 %
Platelets: 196 10*3/uL (ref 150–400)
RBC: 3.41 MIL/uL — ABNORMAL LOW (ref 3.87–5.11)
RDW: 13.9 % (ref 11.5–15.5)
WBC: 3.8 10*3/uL — ABNORMAL LOW (ref 4.0–10.5)

## 2018-02-03 LAB — COMPREHENSIVE METABOLIC PANEL
ALT: 12 U/L (ref 0–44)
AST: 13 U/L — ABNORMAL LOW (ref 15–41)
Albumin: 3.2 g/dL — ABNORMAL LOW (ref 3.5–5.0)
Alkaline Phosphatase: 73 U/L (ref 38–126)
Anion gap: 9 (ref 5–15)
CO2: 25 mmol/L (ref 22–32)
Calcium: 9.2 mg/dL (ref 8.9–10.3)
Chloride: 108 mmol/L (ref 98–111)
Creatinine, Ser: 0.81 mg/dL (ref 0.44–1.00)
GFR calc Af Amer: >60 mL/min (ref >60)
GFR calc non Af Amer: >60 mL/min (ref >60)
Glucose, Bld: 85 mg/dL (ref 70–99)
Potassium: 3.2 mmol/L — ABNORMAL LOW (ref 3.5–5.1)
Sodium: 142 mmol/L (ref 135–145)
TOTAL PROTEIN: 6.4 g/dL — AB (ref 6.5–8.1)
Total Bilirubin: 1.2 mg/dL (ref 0.3–1.2)

## 2018-02-03 NOTE — Telephone Encounter (Signed)
Printed avs and calender of upcoming appointment. Per 12/3 los 

## 2018-02-04 ENCOUNTER — Encounter: Payer: Self-pay | Admitting: Hematology

## 2018-02-04 ENCOUNTER — Inpatient Hospital Stay (HOSPITAL_BASED_OUTPATIENT_CLINIC_OR_DEPARTMENT_OTHER): Payer: Medicaid Other | Admitting: Hematology

## 2018-02-04 ENCOUNTER — Inpatient Hospital Stay: Payer: Medicaid Other

## 2018-02-04 ENCOUNTER — Telehealth: Payer: Self-pay | Admitting: Hematology

## 2018-02-04 VITALS — BP 116/59 | HR 98 | Temp 98.4°F | Resp 16

## 2018-02-04 DIAGNOSIS — R05 Cough: Secondary | ICD-10-CM

## 2018-02-04 DIAGNOSIS — E876 Hypokalemia: Secondary | ICD-10-CM | POA: Diagnosis not present

## 2018-02-04 DIAGNOSIS — C50911 Malignant neoplasm of unspecified site of right female breast: Secondary | ICD-10-CM | POA: Diagnosis not present

## 2018-02-04 DIAGNOSIS — T451X5A Adverse effect of antineoplastic and immunosuppressive drugs, initial encounter: Secondary | ICD-10-CM

## 2018-02-04 DIAGNOSIS — Z5111 Encounter for antineoplastic chemotherapy: Secondary | ICD-10-CM | POA: Diagnosis not present

## 2018-02-04 DIAGNOSIS — R079 Chest pain, unspecified: Secondary | ICD-10-CM

## 2018-02-04 DIAGNOSIS — D6481 Anemia due to antineoplastic chemotherapy: Secondary | ICD-10-CM

## 2018-02-04 DIAGNOSIS — C7951 Secondary malignant neoplasm of bone: Secondary | ICD-10-CM

## 2018-02-04 MED ORDER — TRASTUZUMAB CHEMO 150 MG IV SOLR
6.0000 mg/kg | Freq: Once | INTRAVENOUS | Status: AC
  Administered 2018-02-04: 336 mg via INTRAVENOUS
  Filled 2018-02-04: qty 16

## 2018-02-04 MED ORDER — FAMOTIDINE IN NACL 20-0.9 MG/50ML-% IV SOLN
INTRAVENOUS | Status: AC
  Filled 2018-02-04: qty 50

## 2018-02-04 MED ORDER — BENZONATATE 200 MG PO CAPS: 200.0000 mg | ORAL_CAPSULE | Freq: Three times a day (TID) | ORAL | 0 refills | Status: DC | PRN

## 2018-02-04 MED ORDER — SODIUM CHLORIDE 0.9% FLUSH
10.0000 mL | INTRAVENOUS | Status: DC | PRN
  Administered 2018-02-04: 10 mL
  Filled 2018-02-04: qty 10

## 2018-02-04 MED ORDER — GABAPENTIN 300 MG PO CAPS: 300.0000 mg | ORAL_CAPSULE | Freq: Every day | ORAL | 1 refills | Status: DC

## 2018-02-04 MED ORDER — SODIUM CHLORIDE 0.9 % IV SOLN
420.0000 mg | Freq: Once | INTRAVENOUS | Status: AC
  Administered 2018-02-04: 420 mg via INTRAVENOUS
  Filled 2018-02-04: qty 14

## 2018-02-04 MED ORDER — FAMOTIDINE IN NACL 20-0.9 MG/50ML-% IV SOLN
20.0000 mg | Freq: Once | INTRAVENOUS | Status: AC
  Administered 2018-02-04: 20 mg via INTRAVENOUS

## 2018-02-04 MED ORDER — LORATADINE 10 MG PO TABS
ORAL_TABLET | ORAL | Status: AC
  Filled 2018-02-04: qty 1

## 2018-02-04 MED ORDER — FLUTICASONE PROPIONATE 50 MCG/ACT NA SUSP: 1.0000 | Freq: Every day | NASAL | 2 refills | Status: DC

## 2018-02-04 MED ORDER — SODIUM CHLORIDE 0.9 % IV SOLN
Freq: Once | INTRAVENOUS | Status: AC
  Administered 2018-02-04: 09:00:00 via INTRAVENOUS
  Filled 2018-02-04: qty 250

## 2018-02-04 MED ORDER — HEPARIN SOD (PORK) LOCK FLUSH 100 UNIT/ML IV SOLN
500.0000 [IU] | Freq: Once | INTRAVENOUS | Status: AC | PRN
  Administered 2018-02-04: 500 [IU]
  Filled 2018-02-04: qty 5

## 2018-02-04 MED ORDER — LORATADINE 10 MG PO TABS
10.0000 mg | ORAL_TABLET | Freq: Every day | ORAL | Status: DC
  Administered 2018-02-04: 10 mg via ORAL

## 2018-02-04 MED ORDER — ACETAMINOPHEN 325 MG PO TABS
ORAL_TABLET | ORAL | Status: AC
  Filled 2018-02-04: qty 2

## 2018-02-04 MED ORDER — SODIUM CHLORIDE 0.9 % IV SOLN
80.0000 mg/m2 | Freq: Once | INTRAVENOUS | Status: AC
  Administered 2018-02-04: 132 mg via INTRAVENOUS
  Filled 2018-02-04: qty 22

## 2018-02-04 MED ORDER — DEXAMETHASONE SODIUM PHOSPHATE 10 MG/ML IJ SOLN
INTRAMUSCULAR | Status: AC
  Filled 2018-02-04: qty 1

## 2018-02-04 MED ORDER — DEXAMETHASONE SODIUM PHOSPHATE 10 MG/ML IJ SOLN
10.0000 mg | Freq: Once | INTRAMUSCULAR | Status: AC
  Administered 2018-02-04: 10 mg via INTRAVENOUS

## 2018-02-04 MED ORDER — ACETAMINOPHEN 325 MG PO TABS
650.0000 mg | ORAL_TABLET | Freq: Once | ORAL | Status: AC
  Administered 2018-02-04: 650 mg via ORAL

## 2018-02-04 NOTE — Telephone Encounter (Signed)
Appt already scheduled per 01/24 los.

## 2018-02-04 NOTE — Progress Notes (Signed)
Ok to premed pt before Dr. Burr Medico comes to see pt in infusion area

## 2018-02-04 NOTE — Patient Instructions (Signed)
[name] Discharge Instructions for Patients Receiving Chemotherapy  Today you received the following chemotherapy agents: Trastuzumab (Herceptin), Pertuzumab (Perjeta), and Paclitaxel (Taxol)  To help prevent nausea and vomiting after your treatment, we encourage you to take your nausea medication as directed.    If you develop nausea and vomiting that is not controlled by your nausea medication, call the clinic.   BELOW ARE SYMPTOMS THAT SHOULD BE REPORTED IMMEDIATELY:  *FEVER GREATER THAN 100.5 F  *CHILLS WITH OR WITHOUT FEVER  NAUSEA AND VOMITING THAT IS NOT CONTROLLED WITH YOUR NAUSEA MEDICATION  *UNUSUAL SHORTNESS OF BREATH  *UNUSUAL BRUISING OR BLEEDING  TENDERNESS IN MOUTH AND THROAT WITH OR WITHOUT PRESENCE OF ULCERS  *URINARY PROBLEMS  *BOWEL PROBLEMS  UNUSUAL RASH Items with * indicate a potential emergency and should be followed up as soon as possible.  Feel free to call the clinic should you have any questions or concerns. The clinic phone number is [phone].  Please show the [place] at check-in to the Emergency Department and triage nurse.

## 2018-02-04 NOTE — Addendum Note (Signed)
Addended by: Truitt Merle on: 02/04/2018 12:12 PM   Modules accepted: Orders

## 2018-02-04 NOTE — Progress Notes (Addendum)
[name]   [phone] Fax:([phone]   Clinic Follow up Note   Patient Care Team: [name], [name], [name] as PCP - General (Physician Assistant) Sueanne Margarita, MD as PCP - [facility] (Cardiology) Sueanne Margarita, MD as PCP - Cardiology (Cardiology) [name], MD as Consulting Physician (Orthopedic Surgery)  Date of Service:  02/04/2018  CHIEF COMPLAINT: F/u of metastatic right breast cancer  SUMMARY OF ONCOLOGIC HISTORY: Oncology History    Cancer Staging No matching staging information was found for the patient.       [name], right ([place])   11/1997 Cancer Diagnosis    Patient had 1.4 cm poorly differentiated right breast carcinoma diagnosed in Nov 1999 at age 24, 1/7 axillary nodes involved, ER/PR and HER 2 positive. She had mastectomy with the 7 axillary node evaluation, 4 cycles of adriamycin/ cytoxan followed by taxotere, then five years of tamoxifen thru June 2005 (no herceptin in 1999). She had bilateral oophorectomy in May 2005. She was briefly on aromatase inhibitor after tamoxifen, but discontinued this herself due to poor tolerance.    04/04/2015 Genetic Testing    Negative genetic testing on the breast/ovarian cancer pnael.  The Breast/Ovarian gene panel offered by [facility] includes sequencing and rearrangement analysis for the following 20 genes:  ATM, BARD1, BRCA1, BRCA2, BRIP1, CDH1, CHEK2, EPCAM, FANCC, MLH1, MSH2, MSH6, NBN, PALB2, PMS2, PTEN, RAD51C, RAD51D, TP53, and XRCC2.    06/26/2015 Pathology Results    Initial Path Report Bone, curettage, Left femur met, intramedullary subtroch tissue - METASTATIC ADENOCARCINOMA.  Estrogen Receptor: 95%, POSITIVE, STRONG STAINING INTENSITY Progesterone Receptor: 2%, POSITIVE, STRONG  HER2 (+), IHC 3+    06/26/2015 Surgery    Biopsy of metastatic tissue and stabilization with Affixus trochanteric nail, proximal and distal interlock of left  femur by Dr. [name]     06/28/2015 Imaging    CT Chest w/ Contrast 1. Extensive right internal mammary chain lymphadenopathy consistent with metastatic breast cancer. 2. Lytic metastases involving the posterior elements at T1 with probable nondisplaced pathologic fracture of the spinous process. 3. No other evidence of thoracic metastatic disease. 4. Trace bilateral pleural effusions with associated bibasilar atelectasis. 5. Indeterminate right thyroid nodule, not previously imaged. This could be evaluated with thyroid ultrasound as clinically warranted.    07/01/2015 Imaging    Bone Scan 1. Increased activity noted throughout the left femur. Although metastatic disease cannot be excluded, these changes are most likely secondary to prior surgery. 2. No other focal abnormalities identified to suggest metastatic disease.    07/12/2015 - 07/26/2015 Radiation Therapy    Left femur, 30 Gy in 10 fractions by Dr. Sondra Come     07/14/2015 Initial Diagnosis    [name], right ([place])    07/19/2015 Imaging    Initial PET Scan 1. Severe multifocal osseous metastatic disease, much greater than anticipated based on prior imaging. 2. Multifocal nodal metastases involving the right internal mammary, prevascular and subpectoral lymph nodes. No axillary pulmonary involvement identified. 3. No distant extra osseous metastases.    07/30/2015 - 11/29/2015 Chemotherapy    Docetaxel, Herceptin and Perjeta every 3 weeks X6 cycles, pt tolerated moderately well, restaging scan showed excellent response     09/18/2015 Imaging    CT Head w/wo Contrast 1. No acute intracranial abnormality or significant interval change. 2. Stable minimal periventricular white matter hypoattenuation on the right. This may reflect a remote ischemic injury. 3. No  evidence for metastatic disease to the brain. 4. No focal soft tissue lesion to explain the patient's tenderness.    10/30/2015 Imaging    CT Abdomen  Pelvis w/ Contrast 1. Few mildly prominent fluid-filled loops of small bowel scattered throughout the abdomen, with intraluminal fluid density within the distal colon. Given the provided history, findings are suggestive of acute enteritis/diarrheal illness. 2. No other acute intra-abdominal or pelvic process identified. 3. Widespread osseous metastatic disease, better evaluated on most recent PET-CT from 07/19/2015. No pathologic fracture or other complication. 4. 11 mm hypodensity within the left kidney. This lesion measures intermediate density, and is indeterminate. While this lesion is similar in size relative to recent studies, this is increased in size relative to prior study from 2012. Further evaluation with dedicated renal mass protocol CT and/or MRI is recommended for complete characterization.    12/2015 - 05/14/2016 Chemotherapy    Maintenance Herceptin and pejeta every 3 weeks stopped on 05/14/16 due to disease progression    12/25/2015 Imaging    Restaging PET Scan 1. Markedly improved skeletal activity, with only several faint foci of residual accentuated metabolic activity, but resolution of the vast majority of the previously extensive osseous metastatic disease. 2. Resolution of the prior right internal mammary and right prevascular lymph nodes. 3. Residual subcutaneous edema and edema along fascia planes in the left upper thigh. This remains somewhat more than I would expect for placement of an IM nail 6 weeks ago. If there is leg swelling further down, consider Doppler venous ultrasound to rule out left lower extremity DVT. I do not perceive an obvious difference in density between the pelvic and common femoral veins on the noncontrast CT data. 4. Chronic right maxillary and right sphenoid sinusitis.    01/16/2016 - 10/20/2017 Anti-estrogen oral therapy    Letrozole 2.5 mg daily. She was switched to exemestane due to disease progression on 02/08/17. Stopped on 10/20/2017 when treatment  changed to chemo     03/01/2016 Miscellaneous    Patient presented to ED following a fall; she reports she landed on her back and is now experiencing back pain. The patient was evaluated and discharge home same day with pain control.    05/01/2016 Imaging    CT CAP IMPRESSION: 1. Stable exam.  No new or progressive disease identified. 2. No evidence for residual or recurrent adenopathy within the chest. 3. Stable bone metastasis.    05/01/2016 Imaging    BONE SCAN IMPRESSION: 1. Small focal uptake involving the anterior rib ends of the left second and right fourth ribs, new since the prior bone scan. The location of this uptake is more suggestive of a traumatic or inflammatory etiology as opposed to metastatic disease. No other evidence of metastatic disease. 2. There are other areas of stable uptake which are most likely degenerative/reactive, including right shoulder and cervical spine uptake and uptake along the right femur adjacent to the ORIF hardware.    05/20/2016 Imaging    NM PET Skull to Thigh IMPRESSION: 1. There multiple metastatic foci in the bony pelvis, including some new abnormal foci compare to the prior PET-CT, compatible with mildly progressive bony metastatic disease. Also, a left T11 vertebral hypermetabolic metastatic lesion is observed, new compared to the prior exam. 2. No active extraosseous malignancy is currently identified. 3. Right anterior fourth rib healing fracture, appears be    06/04/2016 - 01/08/2017 Chemotherapy    Second line chemotherapy Kadcyla every 3 weeks started on 06/04/16 and stopped on 01/08/17 due  to mixed response.      10/05/2016 Imaging    CT CAP and Bone Scan:  Bones: left intramedullary rod and left femoral head neck screw in place. In the proximal diaphysis of the left femur there is cortical thickening and irregularity. No lytic or blastic bone lesions. No fracture or vertebral endplate destruction.   No definite evidence of  recurrent disease in the chest, abdomen, or pelvis.    10/28/2016 Imaging    CT A/P IMPRESSION: No acute process demonstrated in the abdomen or pelvis. No evidence of bowel obstruction or inflammation.    11/04/2016 Imaging    DG foot complete left: IMPRESSION: No fracture or dislocation.  No soft tissue abnormality    11/23/2016 Imaging    CT RIGHT FEMUR IMPRESSION: Negative CT scan of the right thigh.  No visible metastatic disease.  CT LEFT FEMUR IMPRESSION: 1. Postsurgical changes related to prior cephalomedullary rod fixation of the left femur with linear lucency along the anterolateral proximal femoral cortex at level of the lesser trochanter, suspicious for nondisplaced fracture. 2. Slightly permeative appearance of the proximal left femoral cortex at the site of known prior osseous metastases is largely unchanged. 3. Unchanged patchy lucency and sclerosis along the anterior acetabulum, which appears to correspond with an area of faint uptake on the prior PET-CT, suspicious for osseous metastasis. These results will be called to the ordering clinician or representative by the Radiologist Assistant, and communication documented in the PACS or zVision Dashboard.      01/27/2017 Imaging    IMPRESSION: 1. Mixed response to osseous metastasis. Some hypermetabolic lesions are new and increasingly hypermetabolic, while another index lesion is decreasingly hypermetabolic. 2. No evidence of hypermetabolic soft tissue metastasis.     02/08/2017 - 10/19/2017 Antibody Plan    -Herceptin every 3 week since 02/08/17 -Oral Lanpantinib 1566m and decreased to 5068mdue to diarrhea and depression. Due to disease progression she swithced to Verzenio 15044mn 05/04/17. She did not toelrate well so she was switched to Ibrance 100m45m 05/31/17. Due to her neutropenia, Ibrance dose reduced to 75 mg daily, 3 weeks on, one-week off, stopped on 10/20/2017 due to disease progression.      04/30/2017 PET scan    IMPRESSION: 1. Primarily increase in hypermetabolism of osseous metastasis. An isolated right acetabular lesion is decreased in hypermetabolism since the prior exam. 2. No evidence of hypermetabolic soft tissue metastasis. 3. Similar non FDG avid right-sided thyroid nodule, favoring a benign etiology. Recommend attention on follow-up.    08/30/2017 PET scan    08/30/2017 PET Scan  IMPRESSION: 1. Worsening osseous metastatic disease. 2. Focal hypermetabolism in the central spinal canal the L1 level, similar to the prior exam. Metastatic implant cannot be excluded. 3. Slight enlargement of a right thyroid nodule which now appears more cystic in character. Consider further evaluation with thyroid ultrasound. If patient is clinically hyperthyroid, consider nuclear medicine thyroid uptake and scan    08/30/2017 Progression    08/30/2017 PET Scan  IMPRESSION: 1. Worsening osseous metastatic disease. 2. Focal hypermetabolism in the central spinal canal the L1 level, similar to the prior exam. Metastatic implant cannot be excluded. 3. Slight enlargement of a right thyroid nodule which now appears more cystic in character. Consider further evaluation with thyroid ultrasound. If patient is clinically hyperthyroid, consider nuclear medicine thyroid uptake and scan    09/29/2017 - 10/11/2017 Radiation Therapy    Pt received 27 Gy in 9 fractions directed at the areas of significant uptake  noted on recent bone scan the right pelvis and right proximal femur, with Dr, Sondra Come     10/19/2017 -  Chemotherapy    -Herceptin every 3 weeks starting 02/08/17, perjeta added on 10/20/2017  -weekly Taxol started on 10/21/2017. Will reduce Taxol to 2 weeks on, 1 week off starting on 02/04/17.     10/27/2017 Imaging    10/27/2017 CT AP IMPRESSION: 1. No acute abnormality. No evidence of bowel obstruction or acute bowel inflammation. Normal appendix. 2. Stable patchy sclerotic osseous  metastases throughout the visualized skeleton. No new or progressive metastatic disease in the abdomen or pelvis.    01/11/2018 Imaging    Whole Body Bone Scan 01/11/18  IMPRESSION: 1. Multifocal osseous metastases as described. 2. Right scapular metastasis. 3. Right superior thoracic spinous process. 4. Right sacral ala and right L5 metastases. 5. Right acetabular metastasis. 6. Right proximal femur metastasis. 7. Diffuse metastases throughout the left femur. 8. Anterior right fourth rib metastasis.    01/11/2018 Imaging    CT CAP W Contrast 01/11/18  IMPRESSION: 1. Similar-appearing osseous metastatic disease. 2. No evidence for progressed metastatic disease in the chest, abdomen or pelvis.      CURRENT THERAPY:  Weekly Taxol, Herceptin and Perjeta every 3 weeks. Will reduce Taxol to 2 weeks on, 1 week off starting on 02/04/17.   INTERVAL HISTORY:  [name] is here for a follow up of treatment. She presents to the clinic today with her interpreter. She notes still having mild to moderate diffuse body pain still mainly in her left leg. She takes hydrocodone about 2 BID. She notes the pain keeps her up. She still uses flexeril for her chest pain. She notes her pain is not controlled.  She notes with Hycodan she gets no relief and will become nauseous from taking it. Her cough is better but still causes chest pain after coughing aggressively. She will occasionally have yellow-green phlegm, but mostly dry cough. She denies fever.  She notes being on Depakote for a long time for her vertigo and would like to stop taking it.    REVIEW OF SYSTEMS:   Constitutional: Denies fevers, chills or abnormal weight loss (+) trouble sleeping  Eyes: Denies blurriness of vision Ears, nose, mouth, throat, and face: Denies mucositis or sore throat Respiratory: Denies cough, dyspnea or wheezes (+) Dry cough with occasionally yellow-green phlegm (+) chest pain  Cardiovascular: Denies  palpitation, chest discomfort or lower extremity swelling Gastrointestinal:  Denies nausea, heartburn or change in bowel habits Skin: Denies abnormal skin rashes MSK: (+) Diffuse body pain and aches, mainly in her left leg  Lymphatics: Denies new lymphadenopathy or easy bruising Neurological:Denies numbness, tingling or new weaknesses Behavioral/Psych: Mood is stable, no new changes  All other systems were reviewed with the patient and are negative.  MEDICAL HISTORY:  Past Medical History:  Diagnosis Date  . Abnormal Pap smear   . Anxiety   . Breast cancer ([place])   . Cancer ([place]) 1999   breast-s/p mastectomy, chemo, rad  . CIN I (cervical intraepithelial neoplasia I) 2003   by colpo  . Congenital deafness   . Deaf   . Depression   . Port-A-Cath in place 2017   for chemotherapy  . Radiation 07/12/15-07/26/15   left femur 30 Gy  . S/P bilateral oophorectomy     SURGICAL HISTORY: Past Surgical History:  Procedure Laterality Date  . ABDOMINAL HYSTERECTOMY    . INTRAMEDULLARY (IM) NAIL INTERTROCHANTERIC Left 06/26/2015   Procedure: LEFT  BIOMET LONG AFFIXS NAIL;  Surgeon: [name], MD;  Location: [facility];  Service: Orthopedics;  Laterality: Left;  . IR GENERIC HISTORICAL  08/06/2015   IR US GUIDE VASC ACCESS LEFT 08/06/2015 Aletta Edouard, MD [facility]  . IR GENERIC HISTORICAL  08/06/2015   IR FLUORO GUIDE CV LINE LEFT 08/06/2015 Aletta Edouard, MD [facility]  . IR GENERIC HISTORICAL  03/05/2016   IR CV LINE INJECTION 03/05/2016 Markus Daft, MD [facility]  . LEFT HEART CATH AND CORONARY ANGIOGRAPHY N/A 12/13/2017   Procedure: LEFT HEART CATH AND CORONARY ANGIOGRAPHY;  Surgeon: Leonie Man, MD;  Location: [facility];  Service: Cardiovascular;  Laterality: N/A;  . MASTECTOMY     right breast  . MASTECTOMY    . OVARIAN CYST REMOVAL    . RADIOLOGY WITH ANESTHESIA Left 06/25/2015   Procedure: MRI OF LEFT HIP WITH OR WITHOUT CONTRAST;  Surgeon: Medication Radiologist,  MD;  Location: [facility];  Service: Radiology;  Laterality: Left;  DR. MCINTYRE/MRI  . TUBAL LIGATION      I have reviewed the social history and family history with the patient and they are unchanged from previous note.  ALLERGIES:  is allergic to chlorhexidine; promethazine hcl; and diphenhydramine hcl.  MEDICATIONS:  Current Outpatient Medications  Medication Sig Dispense Refill  . ALPRAZolam (XANAX) 0.25 MG tablet Take 1 tablet (0.25 mg total) by mouth 2 (two) times daily as needed for anxiety. 30 tablet 0  . benzonatate (TESSALON) 200 MG capsule Take 1 capsule (200 mg total) by mouth 3 (three) times daily as needed for cough. 20 capsule 0  . cyclobenzaprine (FLEXERIL) 5 MG tablet Take 1 tablet (5 mg total) by mouth 3 (three) times daily as needed for muscle spasms. 30 tablet 0  . divalproex (DEPAKOTE) 250 MG DR tablet Take 1 tablet (250 mg total) by mouth at bedtime. 30 tablet 2  . gabapentin (NEURONTIN) 300 MG capsule Take 1 capsule (300 mg total) by mouth at bedtime. 30 capsule 1  . HYDROcodone-acetaminophen (NORCO) 10-325 MG tablet Take 1 tablet by mouth every 6 (six) hours as needed. (Patient taking differently: Take 1 tablet by mouth every 6 (six) hours as needed (pain). ) 45 tablet 0  . HYDROcodone-homatropine (HYCODAN) 5-1.5 MG/5ML syrup Take 5 mLs by mouth every 6 (six) hours as needed for cough. 120 mL 0  . lidocaine-prilocaine (EMLA) cream Apply 1 application topically as needed. Apply to Porta-Cath 1-2 hours prior to access as directed. 30 g 2  . Multiple Vitamin (MULTIVITAMIN WITH MINERALS) TABS tablet Take 1 tablet by mouth daily.    . pantoprazole (PROTONIX) 20 MG tablet Take 1 tablet (20 mg total) by mouth daily. 60 tablet 0  . potassium chloride (K-DUR) 10 MEQ tablet Take 2 tablets (20 mEq total) by mouth 2 (two) times daily. 180 tablet 3  . prochlorperazine (COMPAZINE) 10 MG tablet Take 1 tablet (10 mg total) by mouth every 6 (six) hours as needed for nausea or vomiting. 30  tablet 0   No current facility-administered medications for this visit.    Facility-Administered Medications Ordered in Other Visits  Medication Dose Route Frequency Provider Last Rate Last Dose  . loratadine (CLARITIN) tablet 10 mg  10 mg Oral Daily Truitt Merle, MD   10 mg at 02/04/18 0853  . PACLitaxel (TAXOL) 132 mg in sodium chloride 0.9 % 250 mL chemo infusion (</= 54m/m2)  80 mg/m2 (Treatment Plan Recorded) Intravenous Once FTruitt Merle MD 272 mL/hr at  02/04/18 1103 132 mg at 02/04/18 1103  . sodium chloride 0.9 % 1,000 mL with potassium chloride 10 mEq infusion   Intravenous Continuous Gordy Levan, MD   Stopped at 09/10/15 1653  . sodium chloride 0.9 % injection 10 mL  10 mL Intravenous PRN Livesay, Lennis P, MD       Vitals with BMI 02/04/2018  Height 5'5"  Weight 130 lbs  BMI 30.07  Systolic 622  Diastolic 59  Pulse 98  Respirations 16    PHYSICAL EXAMINATION: ECOG PERFORMANCE STATUS: 2 - Symptomatic, <50% confined to bed GENERAL:alert, no distress and comfortable SKIN: skin color, texture, turgor are normal, no rashes or significant lesions EYES: normal, Conjunctiva are pink and non-injected, sclera clear OROPHARYNX:no exudate, no erythema and lips, buccal mucosa, and tongue normal  NECK: supple, thyroid normal size, non-tender, without nodularity LYMPH:  no palpable lymphadenopathy in the cervical, axillary or inguinal LUNGS: clear to auscultation and percussion with normal breathing effort HEART: regular rate & rhythm and no murmurs and no lower extremity edema ABDOMEN:abdomen soft, non-tender and normal bowel sounds Musculoskeletal:no cyanosis of digits and no clubbing  NEURO: alert & oriented x 3 with fluent speech, no focal motor/sensory deficits  LABORATORY DATA:  I have reviewed the data as listed CBC Latest Ref Rng & Units 02/03/2018 01/21/2018 01/14/2018  WBC 4.0 - 10.5 K/uL 3.8(L) 4.9 6.5  Hemoglobin 12.0 - 15.0 g/dL 10.3(L) 9.4(L) 9.7(L)  Hematocrit 36.0 -  46.0 % 31.5(L) 27.8(L) 29.7(L)  Platelets 150 - 400 K/uL 196 260 212     CMP Latest Ref Rng & Units 02/03/2018 01/21/2018 01/14/2018  Glucose 70 - 99 mg/dL 85 75 89  BUN 6 - 20 mg/dL <4(L) 13 7  Creatinine 0.44 - 1.00 mg/dL 0.81 0.79 0.77  Sodium 135 - 145 mmol/L 142 138 141  Potassium 3.5 - 5.1 mmol/L 3.2(L) 3.9 3.6  Chloride 98 - 111 mmol/L 108 106 105  CO2 22 - 32 mmol/L 25 24 28   Calcium 8.9 - 10.3 mg/dL 9.2 8.8(L) 9.1  Total Protein 6.5 - 8.1 g/dL 6.4(L) 6.5 6.7  Total Bilirubin 0.3 - 1.2 mg/dL 1.2 1.6(H) 1.7(H)  Alkaline Phos 38 - 126 U/L 73 64 77  AST 15 - 41 U/L 13(L) 11(L) 13(L)  ALT 0 - 44 U/L 12 11 10       RADIOGRAPHIC STUDIES: I have personally reviewed the radiological images as listed and agreed with the findings in the report. No results found.   ASSESSMENT & PLAN:  [name] is a 53 y.o. 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 -She is currently on weekly Taxol, Herceptin and Perjetaq3weeks, tolerating moderately well.  -Given moderate tolerance to treatment and stable disease, I reduced her Taxol to 2 weeks off and 1 week off and continue Herceptin/Perjeta q3weeks starting 01/14/18.  -She has been taking Depakote for Vertigo which she no longer wants to take. I instructed her to reduce by half tablet for 1 week then 1/2 tab every other night for 1 week, then she can stop.  -Labs reviewed, overall stable and adequate to proceed with taxol, Herceptin, Perjeta today  -F/u in 1 week    2.Chest pain, Body pain  -She has beenevaluated by cardiologist Dr. Sindy Guadeloupe thinks after workup the patient'schest pain is not cardiac related. -Uses Flexeril  for her chest pain as needed, will continue  -No bone metastasis in sternum  seen on recent bone scan.  -Her chest pain is stable and so is her body pain, especially in her left leg. -She takes Hydrocodone 2 tabs BID but this is not controlling her pain.  -I will prescribed her Gabapentin 350m to take first at night to help her sleep and if not enough she can titrate up and add in the daytime. I discussed side effects of drowsiness.   3. Cough  -She recently developed cough, rhinorrhea with clear discharge and N&V and diarrhea which worsened her Chest pain.  -Recent CT chest was also negative.  -She still has mostly aggressive cough.  -Hycodan has not been relieving her. I prescribed her Tessalon and flonase nasal spray for her cough today (02/04/18)  4.Congenital deafness  5.Mild anemia, secondary to chemotherapy and malignancy -Stable, Hg improved to 10.3 today (02/04/2018)   6.Hypokalemia -He has intermittent hyperkalemia, on oral supplement KCL, continue 232m BID -K decreased to 3.2 today (02/04/2018), she will take extra 4028min the infusion room today   7.Goal of care discussion -she understand her goal of care is palliative to prolong her life -she is full code for now   PLAN: -I prescribed her Gabapentin, flonase and tessalon  today  -Labs reviewed, overall stable and adequate to proceed with taxol, Herceptin, Perjeta today -Lab, f/u and treatment in 1 week, for symptom management     No problem-specific Assessment & Plan notes found for this encounter.   No orders of the defined types were placed in this encounter.  All questions were answered. The patient knows to call the clinic with any problems, questions or concerns. No barriers to learning was detected. I spent 25 minutes counseling the patient face to face. The total time spent in the appointment was 30 minutes and more than 50% was on counseling and review of test results     YanTruitt MerleD 02/04/2018   I, AmoJoslyn Devonm acting as scribe for YanTruitt MerleD.   I have  reviewed the above documentation for accuracy and completeness, and I agree with the above.

## 2018-02-08 ENCOUNTER — Other Ambulatory Visit: Payer: Self-pay | Admitting: Hematology

## 2018-02-09 NOTE — Progress Notes (Signed)
[place]   [phone] [phone]) [phone]   Clinic Follow up Note   Patient Care Team: [name], [name], [name] as PCP - General (Physician Assistant) Sueanne Margarita, MD as PCP - [facility] Access (Cardiology) Sueanne Margarita, MD as PCP - Cardiology (Cardiology) [name], MD as Consulting Physician (Orthopedic Surgery)  Date of Service:  02/11/2018  CHIEF COMPLAINT:  F/u of metastatic right breast cancer  SUMMARY OF ONCOLOGIC HISTORY: Oncology History    Cancer Staging No matching staging information was found for the patient.       [name], right ([place])   11/1997 Cancer Diagnosis    Patient had 1.4 cm poorly differentiated right breast carcinoma diagnosed in Nov 1999 at age 71, 1/7 axillary nodes involved, ER/PR and HER 2 positive. She had mastectomy with the 7 axillary node evaluation, 4 cycles of adriamycin/ cytoxan followed by taxotere, then five years of tamoxifen thru June 2005 (no herceptin in 1999). She had bilateral oophorectomy in May 2005. She was briefly on aromatase inhibitor after tamoxifen, but discontinued this herself due to poor tolerance.    04/04/2015 Genetic Testing    Negative genetic testing on the breast/ovarian cancer pnael.  The Breast/Ovarian gene panel offered by [facility] includes sequencing and rearrangement analysis for the following 20 genes:  ATM, BARD1, BRCA1, BRCA2, BRIP1, CDH1, CHEK2, EPCAM, FANCC, MLH1, MSH2, MSH6, NBN, PALB2, PMS2, PTEN, RAD51C, RAD51D, TP53, and XRCC2.    06/26/2015 Pathology Results    Initial Path Report Bone, curettage, Left femur met, intramedullary subtroch tissue - METASTATIC ADENOCARCINOMA.  Estrogen Receptor: 95%, POSITIVE, STRONG STAINING INTENSITY Progesterone Receptor: 2%, POSITIVE, STRONG  HER2 (+), IHC 3+    06/26/2015 Surgery    Biopsy of metastatic tissue and stabilization with Affixus trochanteric nail, proximal and distal interlock of left  femur by Dr. [name]     06/28/2015 Imaging    CT Chest w/ Contrast 1. Extensive right internal mammary chain lymphadenopathy consistent with metastatic breast cancer. 2. Lytic metastases involving the posterior elements at T1 with probable nondisplaced pathologic fracture of the spinous process. 3. No other evidence of thoracic metastatic disease. 4. Trace bilateral pleural effusions with associated bibasilar atelectasis. 5. Indeterminate right thyroid nodule, not previously imaged. This could be evaluated with thyroid ultrasound as clinically warranted.    07/01/2015 Imaging    Bone Scan 1. Increased activity noted throughout the left femur. Although metastatic disease cannot be excluded, these changes are most likely secondary to prior surgery. 2. No other focal abnormalities identified to suggest metastatic disease.    07/12/2015 - 07/26/2015 Radiation Therapy    Left femur, 30 Gy in 10 fractions by Dr. Sondra Come     07/14/2015 Initial Diagnosis    [name], right ([place])    07/19/2015 Imaging    Initial PET Scan 1. Severe multifocal osseous metastatic disease, much greater than anticipated based on prior imaging. 2. Multifocal nodal metastases involving the right internal mammary, prevascular and subpectoral lymph nodes. No axillary pulmonary involvement identified. 3. No distant extra osseous metastases.    07/30/2015 - 11/29/2015 Chemotherapy    Docetaxel, Herceptin and Perjeta every 3 weeks X6 cycles, pt tolerated moderately well, restaging scan showed excellent response     09/18/2015 Imaging    CT Head w/wo Contrast 1. No acute intracranial abnormality or significant interval change. 2. Stable minimal periventricular white matter hypoattenuation on the right. This may reflect a remote ischemic injury. 3.  No evidence for metastatic disease to the brain. 4. No focal soft tissue lesion to explain the patient's tenderness.    10/30/2015 Imaging    CT Abdomen  Pelvis w/ Contrast 1. Few mildly prominent fluid-filled loops of small bowel scattered throughout the abdomen, with intraluminal fluid density within the distal colon. Given the provided history, findings are suggestive of acute enteritis/diarrheal illness. 2. No other acute intra-abdominal or pelvic process identified. 3. Widespread osseous metastatic disease, better evaluated on most recent PET-CT from 07/19/2015. No pathologic fracture or other complication. 4. 11 mm hypodensity within the left kidney. This lesion measures intermediate density, and is indeterminate. While this lesion is similar in size relative to recent studies, this is increased in size relative to prior study from 2012. Further evaluation with dedicated renal mass protocol CT and/or MRI is recommended for complete characterization.    12/2015 - 05/14/2016 Chemotherapy    Maintenance Herceptin and pejeta every 3 weeks stopped on 05/14/16 due to disease progression    12/25/2015 Imaging    Restaging PET Scan 1. Markedly improved skeletal activity, with only several faint foci of residual accentuated metabolic activity, but resolution of the vast majority of the previously extensive osseous metastatic disease. 2. Resolution of the prior right internal mammary and right prevascular lymph nodes. 3. Residual subcutaneous edema and edema along fascia planes in the left upper thigh. This remains somewhat more than I would expect for placement of an IM nail 6 weeks ago. If there is leg swelling further down, consider Doppler venous ultrasound to rule out left lower extremity DVT. I do not perceive an obvious difference in density between the pelvic and common femoral veins on the noncontrast CT data. 4. Chronic right maxillary and right sphenoid sinusitis.    01/16/2016 - 10/20/2017 Anti-estrogen oral therapy    Letrozole 2.5 mg daily. She was switched to exemestane due to disease progression on 02/08/17. Stopped on 10/20/2017 when treatment  changed to chemo     03/01/2016 Miscellaneous    Patient presented to ED following a fall; she reports she landed on her back and is now experiencing back pain. The patient was evaluated and discharge home same day with pain control.    05/01/2016 Imaging    CT CAP IMPRESSION: 1. Stable exam.  No new or progressive disease identified. 2. No evidence for residual or recurrent adenopathy within the chest. 3. Stable bone metastasis.    05/01/2016 Imaging    BONE SCAN IMPRESSION: 1. Small focal uptake involving the anterior rib ends of the left second and right fourth ribs, new since the prior bone scan. The location of this uptake is more suggestive of a traumatic or inflammatory etiology as opposed to metastatic disease. No other evidence of metastatic disease. 2. There are other areas of stable uptake which are most likely degenerative/reactive, including right shoulder and cervical spine uptake and uptake along the right femur adjacent to the ORIF hardware.    05/20/2016 Imaging    NM PET Skull to Thigh IMPRESSION: 1. There multiple metastatic foci in the bony pelvis, including some new abnormal foci compare to the prior PET-CT, compatible with mildly progressive bony metastatic disease. Also, a left T11 vertebral hypermetabolic metastatic lesion is observed, new compared to the prior exam. 2. No active extraosseous malignancy is currently identified. 3. Right anterior fourth rib healing fracture, appears be    06/04/2016 - 01/08/2017 Chemotherapy    Second line chemotherapy Kadcyla every 3 weeks started on 06/04/16 and stopped on 01/08/17  due to mixed response.      10/05/2016 Imaging    CT CAP and Bone Scan:  Bones: left intramedullary rod and left femoral head neck screw in place. In the proximal diaphysis of the left femur there is cortical thickening and irregularity. No lytic or blastic bone lesions. No fracture or vertebral endplate destruction.   No definite evidence of  recurrent disease in the chest, abdomen, or pelvis.    10/28/2016 Imaging    CT A/P IMPRESSION: No acute process demonstrated in the abdomen or pelvis. No evidence of bowel obstruction or inflammation.    11/04/2016 Imaging    DG foot complete left: IMPRESSION: No fracture or dislocation.  No soft tissue abnormality    11/23/2016 Imaging    CT RIGHT FEMUR IMPRESSION: Negative CT scan of the right thigh.  No visible metastatic disease.  CT LEFT FEMUR IMPRESSION: 1. Postsurgical changes related to prior cephalomedullary rod fixation of the left femur with linear lucency along the anterolateral proximal femoral cortex at level of the lesser trochanter, suspicious for nondisplaced fracture. 2. Slightly permeative appearance of the proximal left femoral cortex at the site of known prior osseous metastases is largely unchanged. 3. Unchanged patchy lucency and sclerosis along the anterior acetabulum, which appears to correspond with an area of faint uptake on the prior PET-CT, suspicious for osseous metastasis. These results will be called to the ordering clinician or representative by the Radiologist Assistant, and communication documented in the PACS or zVision Dashboard.      01/27/2017 Imaging    IMPRESSION: 1. Mixed response to osseous metastasis. Some hypermetabolic lesions are new and increasingly hypermetabolic, while another index lesion is decreasingly hypermetabolic. 2. No evidence of hypermetabolic soft tissue metastasis.     02/08/2017 - 10/19/2017 Antibody Plan    -Herceptin every 3 week since 02/08/17 -Oral Lanpantinib 1510m and decreased to 5081mdue to diarrhea and depression. Due to disease progression she swithced to Verzenio 15052mn 05/04/17. She did not toelrate well so she was switched to Ibrance 100m85m 05/31/17. Due to her neutropenia, Ibrance dose reduced to 75 mg daily, 3 weeks on, one-week off, stopped on 10/20/2017 due to disease progression.      04/30/2017 PET scan    IMPRESSION: 1. Primarily increase in hypermetabolism of osseous metastasis. An isolated right acetabular lesion is decreased in hypermetabolism since the prior exam. 2. No evidence of hypermetabolic soft tissue metastasis. 3. Similar non FDG avid right-sided thyroid nodule, favoring a benign etiology. Recommend attention on follow-up.    08/30/2017 PET scan    08/30/2017 PET Scan  IMPRESSION: 1. Worsening osseous metastatic disease. 2. Focal hypermetabolism in the central spinal canal the L1 level, similar to the prior exam. Metastatic implant cannot be excluded. 3. Slight enlargement of a right thyroid nodule which now appears more cystic in character. Consider further evaluation with thyroid ultrasound. If patient is clinically hyperthyroid, consider nuclear medicine thyroid uptake and scan    08/30/2017 Progression    08/30/2017 PET Scan  IMPRESSION: 1. Worsening osseous metastatic disease. 2. Focal hypermetabolism in the central spinal canal the L1 level, similar to the prior exam. Metastatic implant cannot be excluded. 3. Slight enlargement of a right thyroid nodule which now appears more cystic in character. Consider further evaluation with thyroid ultrasound. If patient is clinically hyperthyroid, consider nuclear medicine thyroid uptake and scan    09/29/2017 - 10/11/2017 Radiation Therapy    Pt received 27 Gy in 9 fractions directed at the areas of significant  uptake noted on recent bone scan the right pelvis and right proximal femur, with Dr, Sondra Come     10/19/2017 -  Chemotherapy    -Herceptin every 3 weeks starting 02/08/17, perjeta added on 10/20/2017  -weekly Taxol started on 10/21/2017. Will reduce Taxol to 2 weeks on, 1 week off starting on 02/04/17.     10/27/2017 Imaging    10/27/2017 CT AP IMPRESSION: 1. No acute abnormality. No evidence of bowel obstruction or acute bowel inflammation. Normal appendix. 2. Stable patchy sclerotic osseous  metastases throughout the visualized skeleton. No new or progressive metastatic disease in the abdomen or pelvis.    01/11/2018 Imaging    Whole Body Bone Scan 01/11/18  IMPRESSION: 1. Multifocal osseous metastases as described. 2. Right scapular metastasis. 3. Right superior thoracic spinous process. 4. Right sacral ala and right L5 metastases. 5. Right acetabular metastasis. 6. Right proximal femur metastasis. 7. Diffuse metastases throughout the left femur. 8. Anterior right fourth rib metastasis.    01/11/2018 Imaging    CT CAP W Contrast 01/11/18  IMPRESSION: 1. Similar-appearing osseous metastatic disease. 2. No evidence for progressed metastatic disease in the chest, abdomen or pelvis.      CURRENT THERAPY:  Weekly Taxol, Herceptin and Perjeta every 3 weeks. Will reduce Taxol to 2 weeks on, 1 week off starting on 02/04/17.  INTERVAL HISTORY:  [name] is here for a follow up of treatment. She presents to the clinic today with his interpretor. She notes she has been vomiting and had diarrhea for 1 whole week. She still has cough. She did not take Gabapentin due to side effects of suicidal thoughts and given she had that in the past, she does not want to take it. She notes she has not been able to sleep. Xanax has helped her become calm but does not help her sleep. She notes she stopped Depakote 2-3 days ago.  She notes she had diarrhea almost every 30 minutes at home and has been wearing an adult diaper.     REVIEW OF SYSTEMS:   Constitutional: Denies fevers, chills or abnormal weight loss (+) trouble sleeping  Eyes: Denies blurriness of vision Ears, nose, mouth, throat, and face: Denies mucositis or sore throat (+) aggressive cough  Respiratory: Denies cough, dyspnea or wheezes Cardiovascular: Denies palpitation, chest discomfort or lower extremity swelling Gastrointestinal:  (+) emesis and diarrhea  Skin: Denies abnormal skin rashes Lymphatics: Denies new  lymphadenopathy or easy bruising Neurological:Denies numbness, tingling or new weaknesses Behavioral/Psych: Mood is stable, no new changes  All other systems were reviewed with the patient and are negative.  MEDICAL HISTORY:  Past Medical History:  Diagnosis Date  . Abnormal Pap smear   . Anxiety   . Breast cancer ([place])   . Cancer ([place]) 1999   breast-s/p mastectomy, chemo, rad  . CIN I (cervical intraepithelial neoplasia I) 2003   by colpo  . Congenital deafness   . Deaf   . Depression   . Port-A-Cath in place 2017   for chemotherapy  . Radiation 07/12/15-07/26/15   left femur 30 Gy  . S/P bilateral oophorectomy     SURGICAL HISTORY: Past Surgical History:  Procedure Laterality Date  . ABDOMINAL HYSTERECTOMY    . INTRAMEDULLARY (IM) NAIL INTERTROCHANTERIC Left 06/26/2015   Procedure: LEFT BIOMET LONG AFFIXS NAIL;  Surgeon: [name], MD;  Location: [facility];  Service: Orthopedics;  Laterality: Left;  . IR GENERIC HISTORICAL  08/06/2015   IR US GUIDE VASC ACCESS LEFT  08/06/2015 Aletta Edouard, MD [facility]  . IR GENERIC HISTORICAL  08/06/2015   IR FLUORO GUIDE CV LINE LEFT 08/06/2015 Aletta Edouard, MD [facility]  . IR GENERIC HISTORICAL  03/05/2016   IR CV LINE INJECTION 03/05/2016 Markus Daft, MD [facility]  . LEFT HEART CATH AND CORONARY ANGIOGRAPHY N/A 12/13/2017   Procedure: LEFT HEART CATH AND CORONARY ANGIOGRAPHY;  Surgeon: Leonie Man, MD;  Location: [facility];  Service: Cardiovascular;  Laterality: N/A;  . MASTECTOMY     right breast  . MASTECTOMY    . OVARIAN CYST REMOVAL    . RADIOLOGY WITH ANESTHESIA Left 06/25/2015   Procedure: MRI OF LEFT HIP WITH OR WITHOUT CONTRAST;  Surgeon: Medication Radiologist, MD;  Location: [facility];  Service: Radiology;  Laterality: Left;  DR. MCINTYRE/MRI  . TUBAL LIGATION      I have reviewed the social history and family history with the patient and they are unchanged from previous note.  ALLERGIES:  is allergic to  chlorhexidine; promethazine hcl; and diphenhydramine hcl.  MEDICATIONS:  Current Outpatient Medications  Medication Sig Dispense Refill  . ALPRAZolam (XANAX) 0.25 MG tablet Take 1 tablet (0.25 mg total) by mouth 2 (two) times daily as needed for anxiety. 30 tablet 0  . benzonatate (TESSALON) 200 MG capsule Take 1 capsule (200 mg total) by mouth 3 (three) times daily as needed for cough. 20 capsule 0  . cyclobenzaprine (FLEXERIL) 5 MG tablet Take 1 tablet (5 mg total) by mouth 3 (three) times daily as needed for muscle spasms. 30 tablet 0  . fluticasone (FLONASE) 50 MCG/ACT nasal spray Place 1 spray into both nostrils daily. 16 g 2  . HYDROcodone-homatropine (HYCODAN) 5-1.5 MG/5ML syrup Take 5 mLs by mouth every 6 (six) hours as needed for cough. 120 mL 0  . lidocaine-prilocaine (EMLA) cream Apply 1 application topically as needed. Apply to Porta-Cath 1-2 hours prior to access as directed. 30 g 2  . pantoprazole (PROTONIX) 20 MG tablet Take 1 tablet (20 mg total) by mouth daily. 60 tablet 0  . potassium chloride (K-DUR) 10 MEQ tablet Take 2 tablets (20 mEq total) by mouth 2 (two) times daily. 180 tablet 3  . prochlorperazine (COMPAZINE) 10 MG tablet TAKE 1 TABLET (10 MG TOTAL) BY MOUTH EVERY 6 (SIX) HOURS AS NEEDED FOR NAUSEA OR VOMITING. 30 tablet 0  . albuterol (PROVENTIL HFA;VENTOLIN HFA) 108 (90 Base) MCG/ACT inhaler Inhale 2 puffs into the lungs every 6 (six) hours as needed for wheezing or shortness of breath. 1 Inhaler 0  . diphenoxylate-atropine (LOMOTIL) 2.5-0.025 MG tablet Take 1-2 tablets by mouth 4 (four) times daily as needed for diarrhea or loose stools. 30 tablet 1  . divalproex (DEPAKOTE) 250 MG DR tablet Take 1 tablet (250 mg total) by mouth at bedtime. (Patient not taking: Reported on 02/11/2018) 30 tablet 2  . gabapentin (NEURONTIN) 300 MG capsule Take 1 capsule (300 mg total) by mouth at bedtime. 30 capsule 1  . HYDROcodone-acetaminophen (NORCO) 10-325 MG tablet Take 1 tablet by  mouth every 6 (six) hours as needed. (Patient not taking: Reported on 02/11/2018) 45 tablet 0  . Multiple Vitamin (MULTIVITAMIN WITH MINERALS) TABS tablet Take 1 tablet by mouth daily.    Marland Kitchen oxyCODONE (OXY IR/ROXICODONE) 5 MG immediate release tablet Take 1 tablet (5 mg total) by mouth every 6 (six) hours as needed for severe pain. 30 tablet 0  . zolpidem (AMBIEN) 5 MG tablet Take 1 tablet (5 mg total) by  mouth at bedtime as needed for sleep. 30 tablet 0   No current facility-administered medications for this visit.    Facility-Administered Medications Ordered in Other Visits  Medication Dose Route Frequency Provider Last Rate Last Dose  . sodium chloride 0.9 % 1,000 mL with potassium chloride 10 mEq infusion   Intravenous Continuous Gordy Levan, MD   Stopped at 09/10/15 1653  . sodium chloride 0.9 % injection 10 mL  10 mL Intravenous PRN Livesay, Lennis P, MD        PHYSICAL EXAMINATION: ECOG PERFORMANCE STATUS: 2 - Symptomatic, <50% confined to bed  Vitals:   02/11/18 0959  BP: 107/75  Pulse: 96  Resp: 18  Temp: 97.6 F (36.4 C)  SpO2: 100%   Filed Weights   02/11/18 0959  Weight: 127 lb 4.8 oz (57.7 kg)    GENERAL:alert, no distress and comfortable SKIN: skin color, texture, turgor are normal, no rashes or significant lesions EYES: normal, Conjunctiva are pink and non-injected, sclera clear OROPHARYNX:no exudate, no erythema and lips, buccal mucosa, and tongue normal  NECK: supple, thyroid normal size, non-tender, without nodularity LYMPH:  no palpable lymphadenopathy in the cervical, axillary or inguinal LUNGS: clear to auscultation and percussion with normal breathing effort HEART: regular rate & rhythm and no murmurs and no lower extremity edema ABDOMEN:abdomen soft, non-tender and normal bowel sounds Musculoskeletal:no cyanosis of digits and no clubbing  NEURO: alert & oriented x 3 with fluent speech, no focal motor/sensory deficits BREAST: Status post right  mastectomy and implant reconstruction, no palpable mass in the right or left breast, no adenopathy.  LABORATORY DATA:  I have reviewed the data as listed CBC Latest Ref Rng & Units 02/11/2018 02/03/2018 01/21/2018  WBC 4.0 - 10.5 K/uL 4.5 3.8(L) 4.9  Hemoglobin 12.0 - 15.0 g/dL 10.5(L) 10.3(L) 9.4(L)  Hematocrit 36.0 - 46.0 % 30.7(L) 31.5(L) 27.8(L)  Platelets 150 - 400 K/uL 268 196 260     CMP Latest Ref Rng & Units 02/11/2018 02/03/2018 01/21/2018  Glucose 70 - 99 mg/dL 88 85 75  BUN 6 - 20 mg/dL 11 <4(L) 13  Creatinine 0.44 - 1.00 mg/dL 0.79 0.81 0.79  Sodium 135 - 145 mmol/L 139 142 138  Potassium 3.5 - 5.1 mmol/L 4.0 3.2(L) 3.9  Chloride 98 - 111 mmol/L 107 108 106  CO2 22 - 32 mmol/L _0 Calcium 8.9 - 10.3 mg/dL 9.0 9.2 8.8(L)  Total Protein 6.5 - 8.1 g/dL 6.8 6.4(L) 6.5  Total Bilirubin 0.3 - 1.2 mg/dL 1.2 1.2 1.6(H)  Alkaline Phos 38 - 126 U/L 74 73 64  AST 15 - 41 U/L 14(L) 13(L) 11(L)  ALT 0 - 44 U/L _1 RADIOGRAPHIC STUDIES: I have personally reviewed the radiological images as listed and agreed with the findings in the report. No results found.   ASSESSMENT & PLAN:  BETINA PUCKETT is a 53 y.o. 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 -She is currently on weekly Taxol, Herceptin and Perjetaq3weeks, tolerating moderately well.  -Given moderate tolerance to treatment and stable disease, I reduced her Taxol to 2 weeks off and 1 week off and continue Herceptin/Perjeta q3weeks starting 01/14/18.  -Labs reviewed and adequate to proceed with Taxol today. Chemo break next week  -F/u in 2 weeks.  -  next restaging scan in late March, or sooner if clinically worsening    2.Chest pain, Body pain  -She has beenevaluated by  cardiologist Dr. Sindy Guadeloupe thinks after workup the patient'schest pain is not cardiac related. 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  -Her chest pain is stable and so is her body pain, especially in her left leg. -Uses Flexeril for her chest pain as needed, will continue -She takes Hydrocodone 2 tabs BID but this is not controlling her pain. -I previously prescribed her Gabapentin, but decided not to take due to potential side effect of suicidal thoughts.  -I will prescribe her Oxycodone 49m q6hours as needed, and she can continue Hydrocodone for breakthrough pain. (02/11/18)  3.Cough -She recentlydeveloped cough, rhinorrhea with clear discharge and N&V and diarrhea which worsened her Chest pain.  -Recent CT chest was also negative. But shows atelectasis at the lung base -She still has frequent dry cough.  -Hycodan and Tessalon has not been relieving her. She has been taking Flonase nasal spray with inprovement. I recommend she use Flonase BID.  -I encouraged her to practice taking deep breaths to open up her lungs.  -I discussed using an inhaler to decrease her inflammation and irritation to reduce her cough. She is interested. I called in albuterol today.  4.Congenital deafness  5.Mild anemia, secondary to chemotherapy and malignancy -Stable, Hgimproved to 10.5 today (02/11/2018)   6.Hypokalemia -He has intermittent hyperkalemia, on oral supplementKCL, continue 243m BID -K normal at 4 today (02/11/18).   7.Goal of care discussion -she understand her goal of care is palliative to prolong her life -she is full code for now  8. Insomnia  -She cannot take Benadryl due to allergy reaction. She has tried trazodone with no relief.  -She is willing to try medication. I prescribed her Ambien today (02/11/18). I discussed with long term use can cause dependence. I encouraged her to take 2m32mightly as needed.   9. Diarrhea -She has weaned off Depakote,  which pt notes with no noticeable change.  -She has been having significant diarrhea this week, about every 30 minutes  -I recommend she start OTC Imodium. I will call in Lomotil if Imodium is not enough.  -I discussed if she uses too much it may cause constipation. Will monitor.  -her stool for c-diff was negative today.    PLAN: -I prescribed her Ambien, Lomotil and Oxycodone and refilled her Dexa today  -Labs reviewed, overall stable and adequate to proceed with taxol today, chemo break for next 2 weeks, she has a trip coming  -Lab, flush, f/u and Taxol, Herceptin, Perjeta on 2/14    No problem-specific Assessment & Plan notes found for this encounter.   Orders Placed This Encounter  Procedures  . C difficile quick screen w PCR reflex    Standing Status:   Future    Number of Occurrences:   1    Standing Expiration Date:   02/12/2019   All questions were answered. The patient knows to call the clinic with any problems, questions or concerns. No barriers to learning was detected. I spent 25 minutes counseling the patient face to face. The total time spent in the appointment was 30 minutes and more than 50% was on counseling and review of test results     YanTruitt MerleD 02/11/2018   I, AmoJoslyn Devonm acting as scribe for YanTruitt MerleD.   I have reviewed the above documentation for accuracy and completeness, and I agree with  the above.

## 2018-02-11 ENCOUNTER — Other Ambulatory Visit: Payer: Self-pay

## 2018-02-11 ENCOUNTER — Inpatient Hospital Stay: Payer: Medicaid Other

## 2018-02-11 ENCOUNTER — Telehealth: Payer: Self-pay | Admitting: Hematology

## 2018-02-11 ENCOUNTER — Encounter: Payer: Self-pay | Admitting: Hematology

## 2018-02-11 ENCOUNTER — Inpatient Hospital Stay (HOSPITAL_BASED_OUTPATIENT_CLINIC_OR_DEPARTMENT_OTHER): Payer: Medicaid Other | Admitting: Hematology

## 2018-02-11 VITALS — BP 107/75 | HR 96 | Temp 97.6°F | Resp 18 | Ht 65.0 in | Wt 127.3 lb

## 2018-02-11 DIAGNOSIS — C50911 Malignant neoplasm of unspecified site of right female breast: Secondary | ICD-10-CM

## 2018-02-11 DIAGNOSIS — A09 Infectious gastroenteritis and colitis, unspecified: Secondary | ICD-10-CM

## 2018-02-11 DIAGNOSIS — R971 Elevated cancer antigen 125 [CA 125]: Secondary | ICD-10-CM | POA: Diagnosis not present

## 2018-02-11 DIAGNOSIS — C7951 Secondary malignant neoplasm of bone: Secondary | ICD-10-CM

## 2018-02-11 DIAGNOSIS — C50919 Malignant neoplasm of unspecified site of unspecified female breast: Secondary | ICD-10-CM

## 2018-02-11 DIAGNOSIS — Z5111 Encounter for antineoplastic chemotherapy: Secondary | ICD-10-CM | POA: Diagnosis not present

## 2018-02-11 DIAGNOSIS — C799 Secondary malignant neoplasm of unspecified site: Principal | ICD-10-CM

## 2018-02-11 DIAGNOSIS — G4700 Insomnia, unspecified: Secondary | ICD-10-CM

## 2018-02-11 DIAGNOSIS — Z95828 Presence of other vascular implants and grafts: Secondary | ICD-10-CM

## 2018-02-11 LAB — CBC WITH DIFFERENTIAL/PLATELET
Abs Immature Granulocytes: 0.2 10*3/uL — ABNORMAL HIGH (ref 0.00–0.07)
Basophils Absolute: 0.1 10*3/uL (ref 0.0–0.1)
Basophils Relative: 1 %
Eosinophils Absolute: 0.1 10*3/uL (ref 0.0–0.5)
Eosinophils Relative: 2 %
HCT: 30.7 % — ABNORMAL LOW (ref 36.0–46.0)
Hemoglobin: 10.5 g/dL — ABNORMAL LOW (ref 12.0–15.0)
Immature Granulocytes: 4 %
Lymphocytes Relative: 18 %
Lymphs Abs: 0.8 10*3/uL (ref 0.7–4.0)
MCH: 30.5 pg (ref 26.0–34.0)
MCHC: 34.2 g/dL (ref 30.0–36.0)
MCV: 89.2 fL (ref 80.0–100.0)
Monocytes Absolute: 0.3 10*3/uL (ref 0.1–1.0)
Monocytes Relative: 7 %
Neutro Abs: 3.1 10*3/uL (ref 1.7–7.7)
Neutrophils Relative %: 68 %
Platelets: 268 10*3/uL (ref 150–400)
RBC: 3.44 MIL/uL — ABNORMAL LOW (ref 3.87–5.11)
RDW: 13.9 % (ref 11.5–15.5)
WBC: 4.5 10*3/uL (ref 4.0–10.5)
nRBC: 0 % (ref 0.0–0.2)

## 2018-02-11 LAB — COMPREHENSIVE METABOLIC PANEL
ALBUMIN: 3.5 g/dL (ref 3.5–5.0)
ALT: 13 U/L (ref 0–44)
AST: 14 U/L — ABNORMAL LOW (ref 15–41)
Alkaline Phosphatase: 74 U/L (ref 38–126)
Anion gap: 10 (ref 5–15)
BUN: 11 mg/dL (ref 6–20)
CHLORIDE: 107 mmol/L (ref 98–111)
CO2: 22 mmol/L (ref 22–32)
Calcium: 9 mg/dL (ref 8.9–10.3)
Creatinine, Ser: 0.79 mg/dL (ref 0.44–1.00)
GFR calc Af Amer: >60 mL/min (ref >60)
GFR calc non Af Amer: >60 mL/min (ref >60)
Glucose, Bld: 88 mg/dL (ref 70–99)
Potassium: 4 mmol/L (ref 3.5–5.1)
Sodium: 139 mmol/L (ref 135–145)
Total Bilirubin: 1.2 mg/dL (ref 0.3–1.2)
Total Protein: 6.8 g/dL (ref 6.5–8.1)

## 2018-02-11 LAB — C DIFFICILE QUICK SCREEN W PCR REFLEX
C Diff antigen: NEGATIVE
C Diff interpretation: NOT DETECTED
C Diff toxin: NEGATIVE

## 2018-02-11 MED ORDER — SODIUM CHLORIDE 0.9% FLUSH
10.0000 mL | Freq: Once | INTRAVENOUS | Status: AC
  Administered 2018-02-11: 10 mL
  Filled 2018-02-11: qty 10

## 2018-02-11 MED ORDER — DEXAMETHASONE SODIUM PHOSPHATE 10 MG/ML IJ SOLN
10.0000 mg | Freq: Once | INTRAMUSCULAR | Status: AC
  Administered 2018-02-11: 10 mg via INTRAVENOUS

## 2018-02-11 MED ORDER — DIPHENOXYLATE-ATROPINE 2.5-0.025 MG PO TABS: 1.0000 | ORAL_TABLET | Freq: Four times a day (QID) | ORAL | 1 refills | Status: DC | PRN

## 2018-02-11 MED ORDER — SODIUM CHLORIDE 0.9 % IV SOLN
Freq: Once | INTRAVENOUS | Status: AC
  Administered 2018-02-11: 12:00:00 via INTRAVENOUS
  Filled 2018-02-11: qty 250

## 2018-02-11 MED ORDER — ZOLPIDEM TARTRATE 5 MG PO TABS: 5.0000 mg | ORAL_TABLET | Freq: Every evening | ORAL | 0 refills | Status: DC | PRN

## 2018-02-11 MED ORDER — OXYCODONE HCL 5 MG PO TABS: 5.0000 mg | ORAL_TABLET | Freq: Four times a day (QID) | ORAL | 0 refills | Status: DC | PRN

## 2018-02-11 MED ORDER — FAMOTIDINE IN NACL 20-0.9 MG/50ML-% IV SOLN
INTRAVENOUS | Status: AC
  Filled 2018-02-11: qty 50

## 2018-02-11 MED ORDER — PALONOSETRON HCL INJECTION 0.25 MG/5ML
INTRAVENOUS | Status: AC
  Filled 2018-02-11: qty 5

## 2018-02-11 MED ORDER — ALBUTEROL SULFATE HFA 108 (90 BASE) MCG/ACT IN AERS: 2.0000 | INHALATION_SPRAY | Freq: Four times a day (QID) | RESPIRATORY_TRACT | 0 refills | Status: DC | PRN

## 2018-02-11 MED ORDER — SODIUM CHLORIDE 0.9% FLUSH
10.0000 mL | INTRAVENOUS | Status: DC | PRN
  Administered 2018-02-11: 10 mL
  Filled 2018-02-11: qty 10

## 2018-02-11 MED ORDER — FAMOTIDINE IN NACL 20-0.9 MG/50ML-% IV SOLN
20.0000 mg | Freq: Once | INTRAVENOUS | Status: AC
  Administered 2018-02-11: 20 mg via INTRAVENOUS

## 2018-02-11 MED ORDER — DEXAMETHASONE SODIUM PHOSPHATE 10 MG/ML IJ SOLN
INTRAMUSCULAR | Status: AC
  Filled 2018-02-11: qty 1

## 2018-02-11 MED ORDER — HEPARIN SOD (PORK) LOCK FLUSH 100 UNIT/ML IV SOLN
500.0000 [IU] | Freq: Once | INTRAVENOUS | Status: AC | PRN
  Administered 2018-02-11: 500 [IU]
  Filled 2018-02-11: qty 5

## 2018-02-11 MED ORDER — SODIUM CHLORIDE 0.9 % IV SOLN
80.0000 mg/m2 | Freq: Once | INTRAVENOUS | Status: AC
  Administered 2018-02-11: 132 mg via INTRAVENOUS
  Filled 2018-02-11: qty 22

## 2018-02-11 NOTE — Telephone Encounter (Signed)
No los per 01/31.

## 2018-02-11 NOTE — Patient Instructions (Addendum)
[facility] Discharge Instructions for Patients Receiving Chemotherapy  Today you received the following chemotherapy agents:   paclitaxel (Taxol) To help prevent nausea and vomiting after your treatment, we encourage you to take your nausea medication as prescribed.   If you develop nausea and vomiting that is not controlled by your nausea medication, call the clinic.   BELOW ARE SYMPTOMS THAT SHOULD BE REPORTED IMMEDIATELY:  *FEVER GREATER THAN 100.5 F  *CHILLS WITH OR WITHOUT FEVER  NAUSEA AND VOMITING THAT IS NOT CONTROLLED WITH YOUR NAUSEA MEDICATION  *UNUSUAL SHORTNESS OF BREATH  *UNUSUAL BRUISING OR BLEEDING  TENDERNESS IN MOUTH AND THROAT WITH OR WITHOUT PRESENCE OF ULCERS  *URINARY PROBLEMS  *BOWEL PROBLEMS  UNUSUAL RASH Items with * indicate a potential emergency and should be followed up as soon as possible.  Feel free to call the clinic should you have any questions or concerns. The clinic phone number is [phone].  Please show the [place] at check-in to the Emergency Department and triage nurse.  Implanted [facility] Guide An implanted port is a device that is placed under the skin. It is usually placed in the chest. The device can be used to give IV medicine, to take blood, or for dialysis. You may have an implanted port if:  You need IV medicine that would be irritating to the small veins in your hands or arms.  You need IV medicines, such as antibiotics, for a long period of time.  You need IV nutrition for a long period of time.  You need dialysis. Having a port means that your health care provider will not need to use the veins in your arms for these procedures. You may have fewer limitations when using a port than you would if you used other types of long-term IVs, and you will likely be able to return to normal activities after your incision heals. An implanted port has two main parts:  Reservoir. The reservoir is the part  where a needle is inserted to give medicines or draw blood. The reservoir is round. After it is placed, it appears as a small, raised area under your skin.  Catheter. The catheter is a thin, flexible tube that connects the reservoir to a vein. Medicine that is inserted into the reservoir goes into the catheter and then into the vein. How is my port accessed? To access your port:  A numbing cream may be placed on the skin over the port site.  Your health care provider will put on a mask and sterile gloves.  The skin over your port will be cleaned carefully with a germ-killing soap and allowed to dry.  Your health care provider will gently pinch the port and insert a needle into it.  Your health care provider will check for a blood return to make sure the port is in the vein and is not clogged.  If your port needs to remain accessed to get medicine continuously (constant infusion), your health care provider will place a clear bandage (dressing) over the needle site. The dressing and needle will need to be changed every week, or as told by your health care provider. What is flushing? Flushing helps keep the port from getting clogged. Follow instructions from your health care provider about how and when to flush the port. Ports are usually flushed with saline solution or a medicine called heparin. The need for flushing will depend on how the port is used:  If the port is  only used from time to time to give medicines or draw blood, the port may need to be flushed: ? Before and after medicines have been given. ? Before and after blood has been drawn. ? As part of routine maintenance. Flushing may be recommended every 4-6 weeks.  If a constant infusion is running, the port may not need to be flushed.  Throw away any syringes in a disposal container that is meant for sharp items (sharps container). You can buy a sharps container from a pharmacy, or you can make one by using an empty hard plastic  bottle with a cover. How long will my port stay implanted? The port can stay in for as long as your health care provider thinks it is needed. When it is time for the port to come out, a surgery will be done to remove it. The surgery will be similar to the procedure that was done to put the port in. Follow these instructions at home:   Flush your port as told by your health care provider.  If you need an infusion over several days, follow instructions from your health care provider about how to take care of your port site. Make sure you: ? Wash your hands with soap and water before you change your dressing. If soap and water are not available, use alcohol-based hand sanitizer. ? Change your dressing as told by your health care provider. ? Place any used dressings or infusion bags into a plastic bag. Throw that bag in the trash. ? Keep the dressing that covers the needle clean and dry. Do not get it wet. ? Do not use scissors or sharp objects near the tube. ? Keep the tube clamped, unless it is being used.  Check your port site every day for signs of infection. Check for: ? Redness, swelling, or pain. ? Fluid or blood. ? Pus or a bad smell.  Protect the skin around the port site. ? Avoid wearing bra straps that rub or irritate the site. ? Protect the skin around your port from seat belts. Place a soft pad over your chest if needed.  Bathe or shower as told by your health care provider. The site may get wet as long as you are not actively receiving an infusion.  Return to your normal activities as told by your health care provider. Ask your health care provider what activities are safe for you.  Carry a medical alert card or wear a medical alert bracelet at all times. This will let health care providers know that you have an implanted port in case of an emergency. Get help right away if:  You have redness, swelling, or pain at the port site.  You have fluid or blood coming from your  port site.  You have pus or a bad smell coming from the port site.  You have a fever. Summary  Implanted ports are usually placed in the chest for long-term IV access.  Follow instructions from your health care provider about flushing the port and changing bandages (dressings).  Take care of the area around your port by avoiding clothing that puts pressure on the area, and by watching for signs of infection.  Protect the skin around your port from seat belts. Place a soft pad over your chest if needed.  Get help right away if you have a fever or you have redness, swelling, pain, drainage, or a bad smell at the port site. This information is not intended to replace advice  given to you by your health care provider. Make sure you discuss any questions you have with your health care provider. Document Released: 12/29/2004 Document Revised: 02/01/2016 Document Reviewed: 02/01/2016 Elsevier Interactive Patient Education  2019 [employer].

## 2018-02-15 ENCOUNTER — Telehealth: Payer: Self-pay

## 2018-02-15 NOTE — Telephone Encounter (Signed)
Left voice message for patient regarding lab results.  Per Dr. Burr Medico stool sample was negative for C-diff.  I requested she call us back if let me know if the diarrhea has improved.  (This message was left through deaf services.)

## 2018-02-15 NOTE — Telephone Encounter (Signed)
-----   Message from Truitt Merle, MD sent at 02/12/2018  5:44 PM EST ----- Please let pt know her stool c-diff was negative, check if her diarrhea has improved with imodium and lomotil. Thanks   Truitt Merle  02/12/2018

## 2018-02-23 NOTE — Progress Notes (Signed)
[place]   [phone] [phone]   Clinic Follow up Note   Patient Care Team: [name], [name], [name] as PCP - General (Physician Assistant) Sueanne Margarita, MD as PCP - [facility] (Cardiology) Sueanne Margarita, MD as PCP - Cardiology (Cardiology) [name], MD as Consulting Physician (Orthopedic Surgery)  Date of Service:  02/25/2018  CHIEF COMPLAINT: F/u of metastatic right breast cancer  SUMMARY OF ONCOLOGIC HISTORY: Oncology History    Cancer Staging No matching staging information was found for the patient.       [name], right ([place])   11/1997 Cancer Diagnosis    Patient had 1.4 cm poorly differentiated right breast carcinoma diagnosed in Nov 1999 at age 30, 1/7 axillary nodes involved, ER/PR and HER 2 positive. She had mastectomy with the 7 axillary node evaluation, 4 cycles of adriamycin/ cytoxan followed by taxotere, then five years of tamoxifen thru June 2005 (no herceptin in 1999). She had bilateral oophorectomy in May 2005. She was briefly on aromatase inhibitor after tamoxifen, but discontinued this herself due to poor tolerance.    04/04/2015 Genetic Testing    Negative genetic testing on the breast/ovarian cancer pnael.  The Breast/Ovarian gene panel offered by [facility] includes sequencing and rearrangement analysis for the following 20 genes:  ATM, BARD1, BRCA1, BRCA2, BRIP1, CDH1, CHEK2, EPCAM, FANCC, MLH1, MSH2, MSH6, NBN, PALB2, PMS2, PTEN, RAD51C, RAD51D, TP53, and XRCC2.    06/26/2015 Pathology Results    Initial Path Report Bone, curettage, Left femur met, intramedullary subtroch tissue - METASTATIC ADENOCARCINOMA.  Estrogen Receptor: 95%, POSITIVE, STRONG STAINING INTENSITY Progesterone Receptor: 2%, POSITIVE, STRONG  HER2 (+), IHC 3+    06/26/2015 Surgery    Biopsy of metastatic tissue and stabilization with Affixus trochanteric nail, proximal and distal interlock of left  femur by Dr. [name]     06/28/2015 Imaging    CT Chest w/ Contrast 1. Extensive right internal mammary chain lymphadenopathy consistent with metastatic breast cancer. 2. Lytic metastases involving the posterior elements at T1 with probable nondisplaced pathologic fracture of the spinous process. 3. No other evidence of thoracic metastatic disease. 4. Trace bilateral pleural effusions with associated bibasilar atelectasis. 5. Indeterminate right thyroid nodule, not previously imaged. This could be evaluated with thyroid ultrasound as clinically warranted.    07/01/2015 Imaging    Bone Scan 1. Increased activity noted throughout the left femur. Although metastatic disease cannot be excluded, these changes are most likely secondary to prior surgery. 2. No other focal abnormalities identified to suggest metastatic disease.    07/12/2015 - 07/26/2015 Radiation Therapy    Left femur, 30 Gy in 10 fractions by Dr. Sondra Come     07/14/2015 Initial Diagnosis    [name], right ([place])    07/19/2015 Imaging    Initial PET Scan 1. Severe multifocal osseous metastatic disease, much greater than anticipated based on prior imaging. 2. Multifocal nodal metastases involving the right internal mammary, prevascular and subpectoral lymph nodes. No axillary pulmonary involvement identified. 3. No distant extra osseous metastases.    07/30/2015 - 11/29/2015 Chemotherapy    Docetaxel, Herceptin and Perjeta every 3 weeks X6 cycles, pt tolerated moderately well, restaging scan showed excellent response     09/18/2015 Imaging    CT Head w/wo Contrast 1. No acute intracranial abnormality or significant interval change. 2. Stable minimal periventricular white matter hypoattenuation on the right. This may reflect a remote ischemic injury. 3. No  evidence for metastatic disease to the brain. 4. No focal soft tissue lesion to explain the patient's tenderness.    10/30/2015 Imaging    CT Abdomen  Pelvis w/ Contrast 1. Few mildly prominent fluid-filled loops of small bowel scattered throughout the abdomen, with intraluminal fluid density within the distal colon. Given the provided history, findings are suggestive of acute enteritis/diarrheal illness. 2. No other acute intra-abdominal or pelvic process identified. 3. Widespread osseous metastatic disease, better evaluated on most recent PET-CT from 07/19/2015. No pathologic fracture or other complication. 4. 11 mm hypodensity within the left kidney. This lesion measures intermediate density, and is indeterminate. While this lesion is similar in size relative to recent studies, this is increased in size relative to prior study from 2012. Further evaluation with dedicated renal mass protocol CT and/or MRI is recommended for complete characterization.    12/2015 - 05/14/2016 Chemotherapy    Maintenance Herceptin and pejeta every 3 weeks stopped on 05/14/16 due to disease progression    12/25/2015 Imaging    Restaging PET Scan 1. Markedly improved skeletal activity, with only several faint foci of residual accentuated metabolic activity, but resolution of the vast majority of the previously extensive osseous metastatic disease. 2. Resolution of the prior right internal mammary and right prevascular lymph nodes. 3. Residual subcutaneous edema and edema along fascia planes in the left upper thigh. This remains somewhat more than I would expect for placement of an IM nail 6 weeks ago. If there is leg swelling further down, consider Doppler venous ultrasound to rule out left lower extremity DVT. I do not perceive an obvious difference in density between the pelvic and common femoral veins on the noncontrast CT data. 4. Chronic right maxillary and right sphenoid sinusitis.    01/16/2016 - 10/20/2017 Anti-estrogen oral therapy    Letrozole 2.5 mg daily. She was switched to exemestane due to disease progression on 02/08/17. Stopped on 10/20/2017 when treatment  changed to chemo     03/01/2016 Miscellaneous    Patient presented to ED following a fall; she reports she landed on her back and is now experiencing back pain. The patient was evaluated and discharge home same day with pain control.    05/01/2016 Imaging    CT CAP IMPRESSION: 1. Stable exam.  No new or progressive disease identified. 2. No evidence for residual or recurrent adenopathy within the chest. 3. Stable bone metastasis.    05/01/2016 Imaging    BONE SCAN IMPRESSION: 1. Small focal uptake involving the anterior rib ends of the left second and right fourth ribs, new since the prior bone scan. The location of this uptake is more suggestive of a traumatic or inflammatory etiology as opposed to metastatic disease. No other evidence of metastatic disease. 2. There are other areas of stable uptake which are most likely degenerative/reactive, including right shoulder and cervical spine uptake and uptake along the right femur adjacent to the ORIF hardware.    05/20/2016 Imaging    NM PET Skull to Thigh IMPRESSION: 1. There multiple metastatic foci in the bony pelvis, including some new abnormal foci compare to the prior PET-CT, compatible with mildly progressive bony metastatic disease. Also, a left T11 vertebral hypermetabolic metastatic lesion is observed, new compared to the prior exam. 2. No active extraosseous malignancy is currently identified. 3. Right anterior fourth rib healing fracture, appears be    06/04/2016 - 01/08/2017 Chemotherapy    Second line chemotherapy Kadcyla every 3 weeks started on 06/04/16 and stopped on 01/08/17 due  to mixed response.      10/05/2016 Imaging    CT CAP and Bone Scan:  Bones: left intramedullary rod and left femoral head neck screw in place. In the proximal diaphysis of the left femur there is cortical thickening and irregularity. No lytic or blastic bone lesions. No fracture or vertebral endplate destruction.   No definite evidence of  recurrent disease in the chest, abdomen, or pelvis.    10/28/2016 Imaging    CT A/P IMPRESSION: No acute process demonstrated in the abdomen or pelvis. No evidence of bowel obstruction or inflammation.    11/04/2016 Imaging    DG foot complete left: IMPRESSION: No fracture or dislocation.  No soft tissue abnormality    11/23/2016 Imaging    CT RIGHT FEMUR IMPRESSION: Negative CT scan of the right thigh.  No visible metastatic disease.  CT LEFT FEMUR IMPRESSION: 1. Postsurgical changes related to prior cephalomedullary rod fixation of the left femur with linear lucency along the anterolateral proximal femoral cortex at level of the lesser trochanter, suspicious for nondisplaced fracture. 2. Slightly permeative appearance of the proximal left femoral cortex at the site of known prior osseous metastases is largely unchanged. 3. Unchanged patchy lucency and sclerosis along the anterior acetabulum, which appears to correspond with an area of faint uptake on the prior PET-CT, suspicious for osseous metastasis. These results will be called to the ordering clinician or representative by the Radiologist Assistant, and communication documented in the PACS or zVision Dashboard.      01/27/2017 Imaging    IMPRESSION: 1. Mixed response to osseous metastasis. Some hypermetabolic lesions are new and increasingly hypermetabolic, while another index lesion is decreasingly hypermetabolic. 2. No evidence of hypermetabolic soft tissue metastasis.     02/08/2017 - 10/19/2017 Antibody Plan    -Herceptin every 3 week since 02/08/17 -Oral Lanpantinib 1566m and decreased to 5068mdue to diarrhea and depression. Due to disease progression she swithced to Verzenio 15044mn 05/04/17. She did not toelrate well so she was switched to Ibrance 100m45m 05/31/17. Due to her neutropenia, Ibrance dose reduced to 75 mg daily, 3 weeks on, one-week off, stopped on 10/20/2017 due to disease progression.      04/30/2017 PET scan    IMPRESSION: 1. Primarily increase in hypermetabolism of osseous metastasis. An isolated right acetabular lesion is decreased in hypermetabolism since the prior exam. 2. No evidence of hypermetabolic soft tissue metastasis. 3. Similar non FDG avid right-sided thyroid nodule, favoring a benign etiology. Recommend attention on follow-up.    08/30/2017 PET scan    08/30/2017 PET Scan  IMPRESSION: 1. Worsening osseous metastatic disease. 2. Focal hypermetabolism in the central spinal canal the L1 level, similar to the prior exam. Metastatic implant cannot be excluded. 3. Slight enlargement of a right thyroid nodule which now appears more cystic in character. Consider further evaluation with thyroid ultrasound. If patient is clinically hyperthyroid, consider nuclear medicine thyroid uptake and scan    08/30/2017 Progression    08/30/2017 PET Scan  IMPRESSION: 1. Worsening osseous metastatic disease. 2. Focal hypermetabolism in the central spinal canal the L1 level, similar to the prior exam. Metastatic implant cannot be excluded. 3. Slight enlargement of a right thyroid nodule which now appears more cystic in character. Consider further evaluation with thyroid ultrasound. If patient is clinically hyperthyroid, consider nuclear medicine thyroid uptake and scan    09/29/2017 - 10/11/2017 Radiation Therapy    Pt received 27 Gy in 9 fractions directed at the areas of significant uptake  noted on recent bone scan the right pelvis and right proximal femur, with Dr, Sondra Come     10/19/2017 -  Chemotherapy    -Herceptin every 3 weeks starting 02/08/17, perjeta added on 10/20/2017  -weekly Taxol started on 10/21/2017. Will reduce Taxol to 2 weeks on, 1 week off starting on 02/04/17.     10/27/2017 Imaging    10/27/2017 CT AP IMPRESSION: 1. No acute abnormality. No evidence of bowel obstruction or acute bowel inflammation. Normal appendix. 2. Stable patchy sclerotic osseous  metastases throughout the visualized skeleton. No new or progressive metastatic disease in the abdomen or pelvis.    01/11/2018 Imaging    Whole Body Bone Scan 01/11/18  IMPRESSION: 1. Multifocal osseous metastases as described. 2. Right scapular metastasis. 3. Right superior thoracic spinous process. 4. Right sacral ala and right L5 metastases. 5. Right acetabular metastasis. 6. Right proximal femur metastasis. 7. Diffuse metastases throughout the left femur. 8. Anterior right fourth rib metastasis.    01/11/2018 Imaging    CT CAP W Contrast 01/11/18  IMPRESSION: 1. Similar-appearing osseous metastatic disease. 2. No evidence for progressed metastatic disease in the chest, abdomen or pelvis.      CURRENT THERAPY:  Weekly Taxol, Herceptin and Perjeta every 3 weeks started on 10/19/2017. Will reduce Taxol to 2 weeks on, 1 week off starting on 02/04/17.  INTERVAL HISTORY:  [name] is here for a follow up and ongoing treatment. She presents to the clinic today with her interpretor. She notes she is doing well after her [place] trip. She is trying to get to the time change. She is ready to start chemo again, she has been tolerant treatment overall well. She notes diffuse body aches but no pain. This does not hinder from doing anything. She is coughing less with robitussin BID. She notes her chest has improved and has an inhaler to use as needed.  She notes she has been taking potassium 2-4 tabs daily.    REVIEW OF SYSTEMS:   Constitutional: Denies fevers, chills or abnormal weight loss Eyes: Denies blurriness of vision Ears, nose, mouth, throat, and face: Denies mucositis or sore throat Respiratory: Denies cough, dyspnea or wheezes Cardiovascular: Denies palpitation, chest discomfort or lower extremity swelling Gastrointestinal:  Denies nausea, heartburn or change in bowel habits Skin: Denies abnormal skin rashes MSK: (+) diffuse body aches Lymphatics: Denies new  lymphadenopathy or easy bruising Neurological:Denies numbness, tingling or new weaknesses Behavioral/Psych: Mood is stable, no new changes  All other systems were reviewed with the patient and are negative.  MEDICAL HISTORY:  Past Medical History:  Diagnosis Date  . Abnormal Pap smear   . Anxiety   . Breast cancer ([place])   . Cancer ([place]) 1999   breast-s/p mastectomy, chemo, rad  . CIN I (cervical intraepithelial neoplasia I) 2003   by colpo  . Congenital deafness   . Deaf   . Depression   . Port-A-Cath in place 2017   for chemotherapy  . Radiation 07/12/15-07/26/15   left femur 30 Gy  . S/P bilateral oophorectomy     SURGICAL HISTORY: Past Surgical History:  Procedure Laterality Date  . ABDOMINAL HYSTERECTOMY    . INTRAMEDULLARY (IM) NAIL INTERTROCHANTERIC Left 06/26/2015   Procedure: LEFT BIOMET LONG AFFIXS NAIL;  Surgeon: [name], MD;  Location: [facility];  Service: Orthopedics;  Laterality: Left;  . IR GENERIC HISTORICAL  08/06/2015   IR US GUIDE VASC ACCESS LEFT 08/06/2015 Aletta Edouard, MD [facility]  . IR GENERIC  HISTORICAL  08/06/2015   IR FLUORO GUIDE CV LINE LEFT 08/06/2015 Aletta Edouard, MD [facility]  . IR GENERIC HISTORICAL  03/05/2016   IR CV LINE INJECTION 03/05/2016 Markus Daft, MD [facility]  . LEFT HEART CATH AND CORONARY ANGIOGRAPHY N/A 12/13/2017   Procedure: LEFT HEART CATH AND CORONARY ANGIOGRAPHY;  Surgeon: Leonie Man, MD;  Location: [facility] [facility];  Service: Cardiovascular;  Laterality: N/A;  . MASTECTOMY     right breast  . MASTECTOMY    . OVARIAN CYST REMOVAL    . RADIOLOGY WITH ANESTHESIA Left 06/25/2015   Procedure: MRI OF LEFT HIP WITH OR WITHOUT CONTRAST;  Surgeon: Medication Radiologist, MD;  Location: [facility];  Service: Radiology;  Laterality: Left;  DR. MCINTYRE/MRI  . TUBAL LIGATION      I have reviewed the social history and family history with the patient and they are unchanged from previous note.  ALLERGIES:  is allergic to  chlorhexidine; promethazine hcl; and diphenhydramine hcl.  MEDICATIONS:  Current Outpatient Medications  Medication Sig Dispense Refill  . albuterol (PROVENTIL HFA;VENTOLIN HFA) 108 (90 Base) MCG/ACT inhaler Inhale 2 puffs into the lungs every 6 (six) hours as needed for wheezing or shortness of breath. 1 Inhaler 0  . ALPRAZolam (XANAX) 0.25 MG tablet Take 1 tablet (0.25 mg total) by mouth 2 (two) times daily as needed for anxiety. 30 tablet 0  . cyclobenzaprine (FLEXERIL) 5 MG tablet Take 1 tablet (5 mg total) by mouth 3 (three) times daily as needed for muscle spasms. 30 tablet 0  . diphenoxylate-atropine (LOMOTIL) 2.5-0.025 MG tablet Take 1-2 tablets by mouth 4 (four) times daily as needed for diarrhea or loose stools. 30 tablet 1  . fluticasone (FLONASE) 50 MCG/ACT nasal spray Place 1 spray into both nostrils daily. 16 g 2  . HYDROcodone-acetaminophen (NORCO) 10-325 MG tablet Take 1 tablet by mouth every 6 (six) hours as needed. 45 tablet 0  . lidocaine-prilocaine (EMLA) cream Apply 1 application topically as needed. Apply to Porta-Cath 1-2 hours prior to access as directed. 30 g 2  . oxyCODONE (OXY IR/ROXICODONE) 5 MG immediate release tablet Take 1 tablet (5 mg total) by mouth every 6 (six) hours as needed for severe pain. 30 tablet 0  . potassium chloride (K-DUR) 10 MEQ tablet Take 2 tablets (20 mEq total) by mouth 2 (two) times daily. 180 tablet 3  . prochlorperazine (COMPAZINE) 10 MG tablet Take 1 tablet (10 mg total) by mouth every 6 (six) hours as needed for nausea or vomiting. 60 tablet 2  . zolpidem (AMBIEN) 5 MG tablet Take 1 tablet (5 mg total) by mouth at bedtime as needed for sleep. 30 tablet 0   No current facility-administered medications for this visit.    Facility-Administered Medications Ordered in Other Visits  Medication Dose Route Frequency Provider Last Rate Last Dose  . loratadine (CLARITIN) tablet 10 mg  10 mg Oral Daily Truitt Merle, MD   10 mg at 02/25/18 1024  .  sodium chloride 0.9 % 1,000 mL with potassium chloride 10 mEq infusion   Intravenous Continuous Gordy Levan, MD   Stopped at 09/10/15 1653  . sodium chloride 0.9 % injection 10 mL  10 mL Intravenous PRN Livesay, Lennis P, MD      . sodium chloride flush (NS) 0.9 % injection 10 mL  10 mL Intracatheter PRN Truitt Merle, MD   10 mL at 02/25/18 1410    PHYSICAL EXAMINATION: ECOG PERFORMANCE STATUS: 1 - Symptomatic but  completely ambulatory  Vitals:   02/25/18 0904  BP: 120/71  Pulse: 74  Resp: 17  Temp: 97.7 F (36.5 C)  SpO2: 100%   Filed Weights   02/25/18 0904  Weight: 132 lb 3.2 oz (60 kg)    GENERAL:alert, no distress and comfortable SKIN: skin color, texture, turgor are normal, no rashes or significant lesions EYES: normal, Conjunctiva are pink and non-injected, sclera clear OROPHARYNX:no exudate, no erythema and lips, buccal mucosa, and tongue normal  NECK: supple, thyroid normal size, non-tender, without nodularity LYMPH:  no palpable lymphadenopathy in the cervical, axillary or inguinal LUNGS: clear to auscultation and percussion with normal breathing effort HEART: regular rate & rhythm and no murmurs and no lower extremity edema ABDOMEN:abdomen soft, non-tender and normal bowel sounds Musculoskeletal:no cyanosis of digits and no clubbing  NEURO: alert & oriented x 3 with fluent speech, no focal motor/sensory deficits  LABORATORY DATA:  I have reviewed the data as listed CBC Latest Ref Rng & Units 02/25/2018 02/11/2018 02/03/2018  WBC 4.0 - 10.5 K/uL 4.6 4.5 3.8(L)  Hemoglobin 12.0 - 15.0 g/dL 9.2(L) 10.5(L) 10.3(L)  Hematocrit 36.0 - 46.0 % 28.3(L) 30.7(L) 31.5(L)  Platelets 150 - 400 K/uL 213 268 196     CMP Latest Ref Rng & Units 02/25/2018 02/11/2018 02/03/2018  Glucose 70 - 99 mg/dL 91 88 85  BUN 6 - 20 mg/dL 12 11 <4(L)  Creatinine 0.44 - 1.00 mg/dL 0.78 0.79 0.81  Sodium 135 - 145 mmol/L 140 139 142  Potassium 3.5 - 5.1 mmol/L 3.2(L) 4.0 3.2(L)  Chloride 98  - 111 mmol/L 106 107 108  CO2 22 - 32 mmol/L '25 22 25  '$ Calcium 8.9 - 10.3 mg/dL 9.0 9.0 9.2  Total Protein 6.5 - 8.1 g/dL 6.3(L) 6.8 6.4(L)  Total Bilirubin 0.3 - 1.2 mg/dL 1.5(H) 1.2 1.2  Alkaline Phos 38 - 126 U/L 69 74 73  AST 15 - 41 U/L 12(L) 14(L) 13(L)  ALT 0 - 44 U/L '10 13 12      '$ RADIOGRAPHIC STUDIES: I have personally reviewed the radiological images as listed and agreed with the findings in the report. No results found.   ASSESSMENT & PLAN:  [name] is a 53 y.o. 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 -She is currently on weekly Taxol, Herceptin and Perjetaq3weeks, tolerating moderately well.  -Given moderate tolerance to treatment and stable disease, I reduced her Taxol to 2 weeks off and 1 week off and continue Herceptin/Perjeta q3weeks starting 01/14/18. she is tolerating better  -She has been doing better since chemo break. She has clinically been doing better as Tumor Marker has decreased recently to normal. -I discussed the option of newly approved Her2 antibody conjugate Enhertu, which showed excellent response rate and long duration for disease control, after patient progressed on Herceptin and TDM 1. I discussed side effects in great detail such as nausea, diarrhea, low blood counts, hair loss, this etc. I gave her reading material. She will think about this.  We will likely switch her to [facility] after next staging scan in late March -Labs reviewed, CBC WNL except mild anemia. CMP is still pending. Overall adequate to proceed with treatment today.  -F/u in 3 weeks    2.Chest pain, Body pain  -She has beenevaluated by cardiologist Dr. Sindy Guadeloupe thinks after workup  the patient'schest pain is not cardiac related. -Nobone  metastasis in sternumseen on recent bone scan.  -Her chest pain is stable and so is her body pain, especially in her left leg. -I previously prescribed her Gabapentin, but decided not to take due to potential side effect of suicidal thoughts.  -Her chest pain improved. She uses Flexeril for her chest pain as needed.  -She mostly only has body aches, no obvious pain. She only takes Oxycodone '5mg'$  q6hours as needed and she can continue Hydrocodone for breakthrough pain, but not using much lately.   3.Cough -Recent CT chest was also negative. But shows atelectasis at the lung base -Hycodan and Tessalon has not been relieving her.  -I encouraged her to practice taking deep breaths to open up her lungs.  -On Flonase BID, and albuterol inhaler. Cough much improved with Robitussin BID, will continue   4.Congenital deafness  5.Mild anemia, secondary to chemotherapy and malignancy -Stable, Hgimproved to 9.2 today (2/142020)   6.Hypokalemia -He has intermittent hyperkalemia, on oral supplementKCL, continue 56mq BID -K 3.2 today, I encouraged her to compliant with potassium supplement  7.Goal of care discussion -she understand her goal of care is palliative to prolong her life -she is full code for now  8. Insomnia  -She cannot take Benadryl due to allergy reaction. She has tried trazodone with no relief.  -I previously prescribed Ambien '5mg'$ .     PLAN: -I refilled her compazine today  -Labs reviewed and adequate to proceed with taxol, Herceptin/Perjeta today  -She will return next week for Taxol, then off chemo for 1 week -Follow-up in 2 weeks before next cycle chemo   No problem-specific Assessment & Plan notes found for this encounter.   No orders of the defined types were placed in this encounter.  All questions were answered. The patient knows to call the clinic with any problems, questions or concerns. No barriers to learning was detected. I spent 25 minutes  counseling the patient face to face. The total time spent in the appointment was 30 minutes and more than 50% was on counseling and review of test results     YTruitt Merle MD 02/25/2018   I, AJoslyn Devon am acting as scribe for YTruitt Merle MD.   I have reviewed the above documentation for accuracy and completeness, and I agree with the above.

## 2018-02-25 ENCOUNTER — Inpatient Hospital Stay: Payer: Medicaid Other | Attending: Hematology

## 2018-02-25 ENCOUNTER — Inpatient Hospital Stay: Payer: Medicaid Other

## 2018-02-25 ENCOUNTER — Encounter: Payer: Self-pay | Admitting: Hematology

## 2018-02-25 ENCOUNTER — Inpatient Hospital Stay (HOSPITAL_BASED_OUTPATIENT_CLINIC_OR_DEPARTMENT_OTHER): Payer: Medicaid Other | Admitting: Hematology

## 2018-02-25 ENCOUNTER — Telehealth: Payer: Self-pay | Admitting: Hematology

## 2018-02-25 VITALS — BP 120/71 | HR 74 | Temp 97.7°F | Resp 17 | Ht 65.0 in | Wt 132.2 lb

## 2018-02-25 DIAGNOSIS — Z79899 Other long term (current) drug therapy: Secondary | ICD-10-CM

## 2018-02-25 DIAGNOSIS — C7951 Secondary malignant neoplasm of bone: Secondary | ICD-10-CM | POA: Diagnosis not present

## 2018-02-25 DIAGNOSIS — Z5111 Encounter for antineoplastic chemotherapy: Secondary | ICD-10-CM | POA: Insufficient documentation

## 2018-02-25 DIAGNOSIS — G4700 Insomnia, unspecified: Secondary | ICD-10-CM | POA: Diagnosis not present

## 2018-02-25 DIAGNOSIS — C50911 Malignant neoplasm of unspecified site of right female breast: Secondary | ICD-10-CM | POA: Insufficient documentation

## 2018-02-25 DIAGNOSIS — C799 Secondary malignant neoplasm of unspecified site: Secondary | ICD-10-CM

## 2018-02-25 DIAGNOSIS — E876 Hypokalemia: Secondary | ICD-10-CM | POA: Diagnosis not present

## 2018-02-25 DIAGNOSIS — R079 Chest pain, unspecified: Secondary | ICD-10-CM | POA: Diagnosis not present

## 2018-02-25 DIAGNOSIS — Z170 Estrogen receptor positive status [ER+]: Secondary | ICD-10-CM

## 2018-02-25 DIAGNOSIS — D6481 Anemia due to antineoplastic chemotherapy: Secondary | ICD-10-CM | POA: Diagnosis not present

## 2018-02-25 DIAGNOSIS — Z95828 Presence of other vascular implants and grafts: Secondary | ICD-10-CM

## 2018-02-25 DIAGNOSIS — E875 Hyperkalemia: Secondary | ICD-10-CM | POA: Insufficient documentation

## 2018-02-25 DIAGNOSIS — C50919 Malignant neoplasm of unspecified site of unspecified female breast: Secondary | ICD-10-CM

## 2018-02-25 LAB — COMPREHENSIVE METABOLIC PANEL
ALT: 10 U/L (ref 0–44)
ANION GAP: 9 (ref 5–15)
AST: 12 U/L — ABNORMAL LOW (ref 15–41)
Albumin: 3.1 g/dL — ABNORMAL LOW (ref 3.5–5.0)
Alkaline Phosphatase: 69 U/L (ref 38–126)
BUN: 12 mg/dL (ref 6–20)
CO2: 25 mmol/L (ref 22–32)
Calcium: 9 mg/dL (ref 8.9–10.3)
Chloride: 106 mmol/L (ref 98–111)
Creatinine, Ser: 0.78 mg/dL (ref 0.44–1.00)
GFR calc Af Amer: >60 mL/min (ref >60)
GFR calc non Af Amer: >60 mL/min (ref >60)
Glucose, Bld: 91 mg/dL (ref 70–99)
POTASSIUM: 3.2 mmol/L — AB (ref 3.5–5.1)
Sodium: 140 mmol/L (ref 135–145)
Total Bilirubin: 1.5 mg/dL — ABNORMAL HIGH (ref 0.3–1.2)
Total Protein: 6.3 g/dL — ABNORMAL LOW (ref 6.5–8.1)

## 2018-02-25 LAB — CBC WITH DIFFERENTIAL/PLATELET
Abs Immature Granulocytes: 0.1 10*3/uL — ABNORMAL HIGH (ref 0.00–0.07)
Basophils Absolute: 0 10*3/uL (ref 0.0–0.1)
Basophils Relative: 0 %
Eosinophils Absolute: 0 10*3/uL (ref 0.0–0.5)
Eosinophils Relative: 1 %
HCT: 28.3 % — ABNORMAL LOW (ref 36.0–46.0)
Hemoglobin: 9.2 g/dL — ABNORMAL LOW (ref 12.0–15.0)
Immature Granulocytes: 2 %
Lymphocytes Relative: 18 %
Lymphs Abs: 0.8 10*3/uL (ref 0.7–4.0)
MCH: 29.3 pg (ref 26.0–34.0)
MCHC: 32.5 g/dL (ref 30.0–36.0)
MCV: 90.1 fL (ref 80.0–100.0)
Monocytes Absolute: 0.4 10*3/uL (ref 0.1–1.0)
Monocytes Relative: 9 %
NEUTROS ABS: 3.3 10*3/uL (ref 1.7–7.7)
NRBC: 0 % (ref 0.0–0.2)
Neutrophils Relative %: 70 %
Platelets: 213 10*3/uL (ref 150–400)
RBC: 3.14 MIL/uL — ABNORMAL LOW (ref 3.87–5.11)
RDW: 14.5 % (ref 11.5–15.5)
WBC: 4.6 10*3/uL (ref 4.0–10.5)

## 2018-02-25 MED ORDER — SODIUM CHLORIDE 0.9 % IV SOLN
420.0000 mg | Freq: Once | INTRAVENOUS | Status: AC
  Administered 2018-02-25: 420 mg via INTRAVENOUS
  Filled 2018-02-25: qty 14

## 2018-02-25 MED ORDER — SODIUM CHLORIDE 0.9% FLUSH
10.0000 mL | INTRAVENOUS | Status: DC | PRN
  Administered 2018-02-25: 10 mL
  Filled 2018-02-25: qty 10

## 2018-02-25 MED ORDER — SODIUM CHLORIDE 0.9% FLUSH
10.0000 mL | Freq: Once | INTRAVENOUS | Status: AC
  Administered 2018-02-25: 10 mL
  Filled 2018-02-25: qty 10

## 2018-02-25 MED ORDER — ACETAMINOPHEN 325 MG PO TABS
ORAL_TABLET | ORAL | Status: AC
  Filled 2018-02-25: qty 2

## 2018-02-25 MED ORDER — DEXAMETHASONE SODIUM PHOSPHATE 10 MG/ML IJ SOLN
10.0000 mg | Freq: Once | INTRAMUSCULAR | Status: AC
  Administered 2018-02-25: 10 mg via INTRAVENOUS

## 2018-02-25 MED ORDER — TRASTUZUMAB CHEMO 150 MG IV SOLR
6.0000 mg/kg | Freq: Once | INTRAVENOUS | Status: AC
  Administered 2018-02-25: 336 mg via INTRAVENOUS
  Filled 2018-02-25: qty 16

## 2018-02-25 MED ORDER — PROCHLORPERAZINE MALEATE 10 MG PO TABS: 10.0000 mg | ORAL_TABLET | Freq: Four times a day (QID) | ORAL | 2 refills | Status: DC | PRN

## 2018-02-25 MED ORDER — ACETAMINOPHEN 325 MG PO TABS
650.0000 mg | ORAL_TABLET | Freq: Once | ORAL | Status: AC
  Administered 2018-02-25: 650 mg via ORAL

## 2018-02-25 MED ORDER — HEPARIN SOD (PORK) LOCK FLUSH 100 UNIT/ML IV SOLN
500.0000 [IU] | Freq: Once | INTRAVENOUS | Status: AC | PRN
  Administered 2018-02-25: 500 [IU]
  Filled 2018-02-25: qty 5

## 2018-02-25 MED ORDER — DEXAMETHASONE SODIUM PHOSPHATE 10 MG/ML IJ SOLN
INTRAMUSCULAR | Status: AC
  Filled 2018-02-25: qty 1

## 2018-02-25 MED ORDER — FAMOTIDINE IN NACL 20-0.9 MG/50ML-% IV SOLN
20.0000 mg | Freq: Once | INTRAVENOUS | Status: AC
  Administered 2018-02-25: 20 mg via INTRAVENOUS

## 2018-02-25 MED ORDER — FAMOTIDINE IN NACL 20-0.9 MG/50ML-% IV SOLN
INTRAVENOUS | Status: AC
  Filled 2018-02-25: qty 50

## 2018-02-25 MED ORDER — SODIUM CHLORIDE 0.9 % IV SOLN
80.0000 mg/m2 | Freq: Once | INTRAVENOUS | Status: AC
  Administered 2018-02-25: 132 mg via INTRAVENOUS
  Filled 2018-02-25: qty 22

## 2018-02-25 MED ORDER — LORATADINE 10 MG PO TABS
ORAL_TABLET | ORAL | Status: AC
  Filled 2018-02-25: qty 1

## 2018-02-25 MED ORDER — SODIUM CHLORIDE 0.9 % IV SOLN
Freq: Once | INTRAVENOUS | Status: AC
  Administered 2018-02-25: 10:00:00 via INTRAVENOUS
  Filled 2018-02-25: qty 250

## 2018-02-25 MED ORDER — LORATADINE 10 MG PO TABS
10.0000 mg | ORAL_TABLET | Freq: Every day | ORAL | Status: DC
  Administered 2018-02-25: 10 mg via ORAL

## 2018-02-25 NOTE — Patient Instructions (Signed)
[name] Discharge Instructions for Patients Receiving Chemotherapy  Today you received the following chemotherapy agents: Trastuzumab (Herceptin), Pertuzumab (Perjeta), and Paclitaxel (Taxol)  To help prevent nausea and vomiting after your treatment, we encourage you to take your nausea medication as directed.    If you develop nausea and vomiting that is not controlled by your nausea medication, call the clinic.   BELOW ARE SYMPTOMS THAT SHOULD BE REPORTED IMMEDIATELY:  *FEVER GREATER THAN 100.5 F  *CHILLS WITH OR WITHOUT FEVER  NAUSEA AND VOMITING THAT IS NOT CONTROLLED WITH YOUR NAUSEA MEDICATION  *UNUSUAL SHORTNESS OF BREATH  *UNUSUAL BRUISING OR BLEEDING  TENDERNESS IN MOUTH AND THROAT WITH OR WITHOUT PRESENCE OF ULCERS  *URINARY PROBLEMS  *BOWEL PROBLEMS  UNUSUAL RASH Items with * indicate a potential emergency and should be followed up as soon as possible.  Feel free to call the clinic should you have any questions or concerns. The clinic phone number is [phone].  Please show the [place] at check-in to the Emergency Department and triage nurse.

## 2018-02-25 NOTE — Telephone Encounter (Signed)
Scheduled appt per 2/14 los.  Printed calendar and avs.

## 2018-03-04 ENCOUNTER — Inpatient Hospital Stay: Payer: Medicaid Other

## 2018-03-04 ENCOUNTER — Telehealth: Payer: Self-pay

## 2018-03-04 NOTE — Telephone Encounter (Signed)
Left voice message through deaf interpreter services, per Infusion patient was to be here today at 7:45 has not arrived, Infusion is stating will have to reschedule her appointments for today, someone from scheduling will be contacting her.

## 2018-03-09 ENCOUNTER — Telehealth: Payer: Self-pay

## 2018-03-09 NOTE — Telephone Encounter (Signed)
Spoke with patient through deaf interpretive services phone system, explained that we do not have any infusion slots this Friday that meet her time restrictions, she did not come last Friday because of the snow/ice.  She can only come on Fridays after 8:00 am due to her obligations with her grandchildren.  She verbalized an understanding we will keep her existing appointments for Friday 03/18/2018.

## 2018-03-16 NOTE — Progress Notes (Addendum)
[name]   [phone] [phone]   Clinic Follow up Note   Patient Care Team: [name], [name], [name] as PCP - General (Physician Assistant) [name], MD as Consulting Physician (Orthopedic Surgery)  Date of Service:  03/18/2018  CHIEF COMPLAINT: F/u of metastatic right breast cancer  SUMMARY OF ONCOLOGIC HISTORY: Oncology History    Cancer Staging No matching staging information was found for the patient.       [name], right ([place])   11/1997 Cancer Diagnosis    Patient had 1.4 cm poorly differentiated right breast carcinoma diagnosed in Nov 1999 at age 77, 1/7 axillary nodes involved, ER/PR and HER 2 positive. She had mastectomy with the 7 axillary node evaluation, 4 cycles of adriamycin/ cytoxan followed by taxotere, then five years of tamoxifen thru June 2005 (no herceptin in 1999). She had bilateral oophorectomy in May 2005. She was briefly on aromatase inhibitor after tamoxifen, but discontinued this herself due to poor tolerance.    04/04/2015 Genetic Testing    Negative genetic testing on the breast/ovarian cancer pnael.  The Breast/Ovarian gene panel offered by [facility] includes sequencing and rearrangement analysis for the following 20 genes:  ATM, BARD1, BRCA1, BRCA2, BRIP1, CDH1, CHEK2, EPCAM, FANCC, MLH1, MSH2, MSH6, NBN, PALB2, PMS2, PTEN, RAD51C, RAD51D, TP53, and XRCC2.    06/26/2015 Pathology Results    Initial Path Report Bone, curettage, Left femur met, intramedullary subtroch tissue - METASTATIC ADENOCARCINOMA.  Estrogen Receptor: 95%, POSITIVE, STRONG STAINING INTENSITY Progesterone Receptor: 2%, POSITIVE, STRONG  HER2 (+), IHC 3+    06/26/2015 Surgery    Biopsy of metastatic tissue and stabilization with Affixus trochanteric nail, proximal and distal interlock of left femur by Dr. [name]     06/28/2015 Imaging    CT Chest w/ Contrast 1. Extensive right internal mammary chain  lymphadenopathy consistent with metastatic breast cancer. 2. Lytic metastases involving the posterior elements at T1 with probable nondisplaced pathologic fracture of the spinous process. 3. No other evidence of thoracic metastatic disease. 4. Trace bilateral pleural effusions with associated bibasilar atelectasis. 5. Indeterminate right thyroid nodule, not previously imaged. This could be evaluated with thyroid ultrasound as clinically warranted.    07/01/2015 Imaging    Bone Scan 1. Increased activity noted throughout the left femur. Although metastatic disease cannot be excluded, these changes are most likely secondary to prior surgery. 2. No other focal abnormalities identified to suggest metastatic disease.    07/12/2015 - 07/26/2015 Radiation Therapy    Left femur, 30 Gy in 10 fractions by Dr. Sondra Come     07/14/2015 Initial Diagnosis    [name], right ([place])    07/19/2015 Imaging    Initial PET Scan 1. Severe multifocal osseous metastatic disease, much greater than anticipated based on prior imaging. 2. Multifocal nodal metastases involving the right internal mammary, prevascular and subpectoral lymph nodes. No axillary pulmonary involvement identified. 3. No distant extra osseous metastases.    07/30/2015 - 11/29/2015 Chemotherapy    Docetaxel, Herceptin and Perjeta every 3 weeks X6 cycles, pt tolerated moderately well, restaging scan showed excellent response     09/18/2015 Imaging    CT Head w/wo Contrast 1. No acute intracranial abnormality or significant interval change. 2. Stable minimal periventricular white matter hypoattenuation on the right. This may reflect a remote ischemic injury. 3. No evidence for metastatic disease to the brain. 4. No focal soft tissue lesion to explain the patient's tenderness.  10/30/2015 Imaging    CT Abdomen Pelvis w/ Contrast 1. Few mildly prominent fluid-filled loops of small bowel scattered throughout the abdomen,  with intraluminal fluid density within the distal colon. Given the provided history, findings are suggestive of acute enteritis/diarrheal illness. 2. No other acute intra-abdominal or pelvic process identified. 3. Widespread osseous metastatic disease, better evaluated on most recent PET-CT from 07/19/2015. No pathologic fracture or other complication. 4. 11 mm hypodensity within the left kidney. This lesion measures intermediate density, and is indeterminate. While this lesion is similar in size relative to recent studies, this is increased in size relative to prior study from 2012. Further evaluation with dedicated renal mass protocol CT and/or MRI is recommended for complete characterization.    12/2015 - 05/14/2016 Chemotherapy    Maintenance Herceptin and pejeta every 3 weeks stopped on 05/14/16 due to disease progression    12/25/2015 Imaging    Restaging PET Scan 1. Markedly improved skeletal activity, with only several faint foci of residual accentuated metabolic activity, but resolution of the vast majority of the previously extensive osseous metastatic disease. 2. Resolution of the prior right internal mammary and right prevascular lymph nodes. 3. Residual subcutaneous edema and edema along fascia planes in the left upper thigh. This remains somewhat more than I would expect for placement of an IM nail 6 weeks ago. If there is leg swelling further down, consider Doppler venous ultrasound to rule out left lower extremity DVT. I do not perceive an obvious difference in density between the pelvic and common femoral veins on the noncontrast CT data. 4. Chronic right maxillary and right sphenoid sinusitis.    01/16/2016 - 10/20/2017 Anti-estrogen oral therapy    Letrozole 2.5 mg daily. She was switched to exemestane due to disease progression on 02/08/17. Stopped on 10/20/2017 when treatment changed to chemo     03/01/2016 Miscellaneous    Patient presented to ED following a fall; she reports she  landed on her back and is now experiencing back pain. The patient was evaluated and discharge home same day with pain control.    05/01/2016 Imaging    CT CAP IMPRESSION: 1. Stable exam.  No new or progressive disease identified. 2. No evidence for residual or recurrent adenopathy within the chest. 3. Stable bone metastasis.    05/01/2016 Imaging    BONE SCAN IMPRESSION: 1. Small focal uptake involving the anterior rib ends of the left second and right fourth ribs, new since the prior bone scan. The location of this uptake is more suggestive of a traumatic or inflammatory etiology as opposed to metastatic disease. No other evidence of metastatic disease. 2. There are other areas of stable uptake which are most likely degenerative/reactive, including right shoulder and cervical spine uptake and uptake along the right femur adjacent to the ORIF hardware.    05/20/2016 Imaging    NM PET Skull to Thigh IMPRESSION: 1. There multiple metastatic foci in the bony pelvis, including some new abnormal foci compare to the prior PET-CT, compatible with mildly progressive bony metastatic disease. Also, a left T11 vertebral hypermetabolic metastatic lesion is observed, new compared to the prior exam. 2. No active extraosseous malignancy is currently identified. 3. Right anterior fourth rib healing fracture, appears be    06/04/2016 - 01/08/2017 Chemotherapy    Second line chemotherapy Kadcyla every 3 weeks started on 06/04/16 and stopped on 01/08/17 due to mixed response.      10/05/2016 Imaging    CT CAP and Bone Scan:  Bones: left  intramedullary rod and left femoral head neck screw in place. In the proximal diaphysis of the left femur there is cortical thickening and irregularity. No lytic or blastic bone lesions. No fracture or vertebral endplate destruction.   No definite evidence of recurrent disease in the chest, abdomen, or pelvis.    10/28/2016 Imaging    CT A/P IMPRESSION: No acute  process demonstrated in the abdomen or pelvis. No evidence of bowel obstruction or inflammation.    11/04/2016 Imaging    DG foot complete left: IMPRESSION: No fracture or dislocation.  No soft tissue abnormality    11/23/2016 Imaging    CT RIGHT FEMUR IMPRESSION: Negative CT scan of the right thigh.  No visible metastatic disease.  CT LEFT FEMUR IMPRESSION: 1. Postsurgical changes related to prior cephalomedullary rod fixation of the left femur with linear lucency along the anterolateral proximal femoral cortex at level of the lesser trochanter, suspicious for nondisplaced fracture. 2. Slightly permeative appearance of the proximal left femoral cortex at the site of known prior osseous metastases is largely unchanged. 3. Unchanged patchy lucency and sclerosis along the anterior acetabulum, which appears to correspond with an area of faint uptake on the prior PET-CT, suspicious for osseous metastasis. These results will be called to the ordering clinician or representative by the Radiologist Assistant, and communication documented in the PACS or zVision Dashboard.      01/27/2017 Imaging    IMPRESSION: 1. Mixed response to osseous metastasis. Some hypermetabolic lesions are new and increasingly hypermetabolic, while another index lesion is decreasingly hypermetabolic. 2. No evidence of hypermetabolic soft tissue metastasis.     02/08/2017 - 10/19/2017 Antibody Plan    -Herceptin every 3 week since 02/08/17 -Oral Lanpantinib 1580m and decreased to 5044mdue to diarrhea and depression. Due to disease progression she swithced to Verzenio 1507mn 05/04/17. She did not toelrate well so she was switched to Ibrance 100m17m 05/31/17. Due to her neutropenia, Ibrance dose reduced to 75 mg daily, 3 weeks on, one-week off, stopped on 10/20/2017 due to disease progression.     04/30/2017 PET scan    IMPRESSION: 1. Primarily increase in hypermetabolism of osseous metastasis. An isolated  right acetabular lesion is decreased in hypermetabolism since the prior exam. 2. No evidence of hypermetabolic soft tissue metastasis. 3. Similar non FDG avid right-sided thyroid nodule, favoring a benign etiology. Recommend attention on follow-up.    08/30/2017 PET scan    08/30/2017 PET Scan  IMPRESSION: 1. Worsening osseous metastatic disease. 2. Focal hypermetabolism in the central spinal canal the L1 level, similar to the prior exam. Metastatic implant cannot be excluded. 3. Slight enlargement of a right thyroid nodule which now appears more cystic in character. Consider further evaluation with thyroid ultrasound. If patient is clinically hyperthyroid, consider nuclear medicine thyroid uptake and scan    08/30/2017 Progression    08/30/2017 PET Scan  IMPRESSION: 1. Worsening osseous metastatic disease. 2. Focal hypermetabolism in the central spinal canal the L1 level, similar to the prior exam. Metastatic implant cannot be excluded. 3. Slight enlargement of a right thyroid nodule which now appears more cystic in character. Consider further evaluation with thyroid ultrasound. If patient is clinically hyperthyroid, consider nuclear medicine thyroid uptake and scan    09/29/2017 - 10/11/2017 Radiation Therapy    Pt received 27 Gy in 9 fractions directed at the areas of significant uptake noted on recent bone scan the right pelvis and right proximal femur, with Dr, KinaSondra Come 10/19/2017 -  Chemotherapy    -Herceptin every 3 weeks starting 02/08/17, perjeta added on 10/20/2017  -weekly Taxol started on 10/21/2017. Will reduce Taxol to 2 weeks on, 1 week off starting on 02/04/17.     10/27/2017 Imaging    10/27/2017 CT AP IMPRESSION: 1. No acute abnormality. No evidence of bowel obstruction or acute bowel inflammation. Normal appendix. 2. Stable patchy sclerotic osseous metastases throughout the visualized skeleton. No new or progressive metastatic disease in the abdomen or pelvis.      01/11/2018 Imaging    Whole Body Bone Scan 01/11/18  IMPRESSION: 1. Multifocal osseous metastases as described. 2. Right scapular metastasis. 3. Right superior thoracic spinous process. 4. Right sacral ala and right L5 metastases. 5. Right acetabular metastasis. 6. Right proximal femur metastasis. 7. Diffuse metastases throughout the left femur. 8. Anterior right fourth rib metastasis.    01/11/2018 Imaging    CT CAP W Contrast 01/11/18  IMPRESSION: 1. Similar-appearing osseous metastatic disease. 2. No evidence for progressed metastatic disease in the chest, abdomen or pelvis.      CURRENT THERAPY:  Weekly Taxol, Herceptin and Perjeta every 3 weeks started on 10/19/2017. Changed Taxol to 2 weeks on, 1 week off starting on 02/04/18  INTERVAL HISTORY:  [name] is here for a follow up of and treatment. She presents to the clinic today with her interpreter. She notes having new pain in her right hip. She notes she now moved to first floor which helps. She notes having nausea and diarrhea. She has had to change her pants 6-7 times. She wears a diaper. This has been going on for 2 weeks. She denies fever. She has abdominal soreness from emesis. She has not had an appetite and not eating much. She has lost 6-7 pounds since last visit. She tried Imodium and it did not work for her and feels it causes more diarrhea. She has not been very active. She has been taking oxycodone as needed, but does not take it daily.    REVIEW OF SYSTEMS:   Constitutional: Denies fevers, chills (+) no appetite, low eating, weight loss  Eyes: Denies blurriness of vision Ears, nose, mouth, throat, and face: Denies mucositis or sore throat Respiratory: Denies cough, dyspnea or wheezes Cardiovascular: Denies palpitation, chest discomfort or lower extremity swelling Gastrointestinal:  (+) N&V (+) diarrhea  MSK: (+) right hip pain  Skin: Denies abnormal skin rashes Lymphatics: Denies new  lymphadenopathy or easy bruising Neurological:Denies numbness, tingling or new weaknesses Behavioral/Psych: Mood is stable, no new changes  All other systems were reviewed with the patient and are negative.  MEDICAL HISTORY:  Past Medical History:  Diagnosis Date  . Abnormal Pap smear   . Anxiety   . Breast cancer (HCC)   . Cancer (HCC) 1999   breast-s/p mastectomy, chemo, rad  . CIN I (cervical intraepithelial neoplasia I) 2003   by colpo  . Congenital deafness   . Deaf   . Depression   . Port-A-Cath in place 2017   for chemotherapy  . Radiation 07/12/15-07/26/15   left femur 30 Gy  . S/P bilateral oophorectomy     SURGICAL HISTORY: Past Surgical History:  Procedure Laterality Date  . ABDOMINAL HYSTERECTOMY    . INTRAMEDULLARY (IM) NAIL INTERTROCHANTERIC Left 06/26/2015   Procedure: LEFT BIOMET LONG AFFIXS NAIL;  Surgeon: Mark C Yates, MD;  Location: [facility] OR;  Service: Orthopedics;  Laterality: Left;  . IR GENERIC HISTORICAL  08/06/2015   IR US GUIDE VASC ACCESS LEFT 08/06/2015 Glenn   Yamagata, MD [facility]  . IR GENERIC HISTORICAL  08/06/2015   IR FLUORO GUIDE CV LINE LEFT 08/06/2015 Glenn Yamagata, MD [facility]  . IR GENERIC HISTORICAL  03/05/2016   IR CV LINE INJECTION 03/05/2016 Adam Henn, MD [facility]  . LEFT HEART CATH AND CORONARY ANGIOGRAPHY N/A 12/13/2017   Procedure: LEFT HEART CATH AND CORONARY ANGIOGRAPHY;  Surgeon: Harding, David W, MD;  Location: [facility] INVASIVE CV [facility];  Service: Cardiovascular;  Laterality: N/A;  . MASTECTOMY     right breast  . MASTECTOMY    . OVARIAN CYST REMOVAL    . RADIOLOGY WITH ANESTHESIA Left 06/25/2015   Procedure: MRI OF LEFT HIP WITH OR WITHOUT CONTRAST;  Surgeon: Medication Radiologist, MD;  Location: [facility] OR;  Service: Radiology;  Laterality: Left;  DR. MCINTYRE/MRI  . TUBAL LIGATION      I have reviewed the social history and family history with the patient and they are unchanged from previous note.  ALLERGIES:  is allergic to  chlorhexidine; promethazine hcl; and diphenhydramine hcl.  MEDICATIONS:  Current Outpatient Medications  Medication Sig Dispense Refill  . albuterol (PROVENTIL HFA;VENTOLIN HFA) 108 (90 Base) MCG/ACT inhaler Inhale 2 puffs into the lungs every 6 (six) hours as needed for wheezing or shortness of breath. 1 Inhaler 0  . ALPRAZolam (XANAX) 0.25 MG tablet Take 1 tablet (0.25 mg total) by mouth 2 (two) times daily as needed for anxiety. 30 tablet 0  . cyclobenzaprine (FLEXERIL) 5 MG tablet Take 1 tablet (5 mg total) by mouth 3 (three) times daily as needed for muscle spasms. 30 tablet 0  . diphenoxylate-atropine (LOMOTIL) 2.5-0.025 MG tablet Take 1-2 tablets by mouth 4 (four) times daily as needed for diarrhea or loose stools. 30 tablet 1  . HYDROcodone-acetaminophen (NORCO) 10-325 MG tablet Take 1 tablet by mouth every 6 (six) hours as needed. 45 tablet 0  . lidocaine-prilocaine (EMLA) cream Apply 1 application topically as needed. Apply to Porta-Cath 1-2 hours prior to access as directed. 30 g 2  . oxyCODONE (OXY IR/ROXICODONE) 5 MG immediate release tablet Take 1 tablet (5 mg total) by mouth every 6 (six) hours as needed for severe pain. 30 tablet 0  . potassium chloride (K-DUR) 10 MEQ tablet Take 2 tablets (20 mEq total) by mouth 2 (two) times daily. 180 tablet 3  . prochlorperazine (COMPAZINE) 10 MG tablet Take 1 tablet (10 mg total) by mouth every 6 (six) hours as needed for nausea or vomiting. 60 tablet 2  . zolpidem (AMBIEN) 5 MG tablet Take 1 tablet (5 mg total) by mouth at bedtime as needed for sleep. 30 tablet 0   No current facility-administered medications for this visit.    Facility-Administered Medications Ordered in Other Visits  Medication Dose Route Frequency Provider Last Rate Last Dose  . sodium chloride 0.9 % 1,000 mL with potassium chloride 10 mEq infusion   Intravenous Continuous Livesay, Lennis P, MD   Stopped at 09/10/15 1653  . sodium chloride 0.9 % injection 10 mL  10 mL  Intravenous PRN Livesay, Lennis P, MD        PHYSICAL EXAMINATION: ECOG PERFORMANCE STATUS: 2 - Symptomatic, <50% confined to bed  Vitals:   03/18/18 0825  BP: 124/68  Pulse: 86  Resp: 18  Temp: 98.1 F (36.7 C)  SpO2: 100%   Filed Weights   03/18/18 0825  Weight: 126 lb 11.2 oz (57.5 kg)    GENERAL:alert, no distress and comfortable SKIN: skin color, texture, turgor are normal,   no rashes or significant lesions EYES: normal, Conjunctiva are pink and non-injected, sclera clear OROPHARYNX:no exudate, no erythema and lips, buccal mucosa, and tongue normal  NECK: supple, thyroid normal size, non-tender, without nodularity LYMPH:  no palpable lymphadenopathy in the cervical, axillary or inguinal LUNGS: clear to auscultation and percussion with normal breathing effort HEART: regular rate & rhythm and no murmurs and no lower extremity edema ABDOMEN:abdomen soft, non-tender and normal bowel sounds (+) Mild RUQ tenderness, no hepatomegaly  Musculoskeletal:no cyanosis of digits and no clubbing  NEURO: alert & oriented x 3 with fluent speech, no focal motor/sensory deficits  LABORATORY DATA:  I have reviewed the data as listed CBC Latest Ref Rng & Units 03/18/2018 02/25/2018 02/11/2018  WBC 4.0 - 10.5 K/uL 5.5 4.6 4.5  Hemoglobin 12.0 - 15.0 g/dL 10.6(L) 9.2(L) 10.5(L)  Hematocrit 36.0 - 46.0 % 32.6(L) 28.3(L) 30.7(L)  Platelets 150 - 400 K/uL 209 213 268     CMP Latest Ref Rng & Units 02/25/2018 02/11/2018 02/03/2018  Glucose 70 - 99 mg/dL 91 88 85  BUN 6 - 20 mg/dL 12 11 <4(L)  Creatinine 0.44 - 1.00 mg/dL 0.78 0.79 0.81  Sodium 135 - 145 mmol/L 140 139 142  Potassium 3.5 - 5.1 mmol/L 3.2(L) 4.0 3.2(L)  Chloride 98 - 111 mmol/L 106 107 108  CO2 22 - 32 mmol/L _0 Calcium 8.9 - 10.3 mg/dL 9.0 9.0 9.2  Total Protein 6.5 - 8.1 g/dL 6.3(L) 6.8 6.4(L)  Total Bilirubin 0.3 - 1.2 mg/dL 1.5(H) 1.2 1.2  Alkaline Phos 38 - 126 U/L 69 74 73  AST 15 - 41 U/L 12(L) 14(L) 13(L)  ALT 0  - 44 U/L _1 RADIOGRAPHIC STUDIES: I have personally reviewed the radiological images as listed and agreed with the findings in the report. No results found.   ASSESSMENT & PLAN:  CORRENE LALANI is a 53 y.o. 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 -She is currently on weekly Taxol, Herceptin and Perjetaq3weeks, tolerating moderately well.  -Given moderate tolerance to treatment and stable disease, I reduced her Taxol to 2 weeks off and 1 week off and continue Herceptin/Perjeta q3weeks starting 01/14/18.she is tolerating better  -She has been doing better since chemo break. She has clinically been doing better as Tumor Marker has decreased recently to normal. -I previously discussed the option of newly approved Her2 antibody conjugate Enhertu, which showed excellent response rate and long duration for disease control, after patient progressed on Herceptin and TDM 1 -In the last 2 weeks without chemo treatment she had worsened diarrhea, N&V, weight loss and new right hip pain.  -I discussed Perjeta can increase her diarrhea so we will hold this for now. Will scan her to monitor her response to treatment. May change treatment based on scan results.  -Labs reviewed, CBC WNL except mild anemia with HG at 10.6, CMP still pending. Ca 27.29 is still pending. Overall adequate to proceed with Taxol and Herceptin today.  -F/u in 2 weeks with scan   2. Body pain  -She has beenevaluated by cardiologist Dr. Sindy Guadeloupe thinks after workup the patient'schest pain is not cardiac related. 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  -Her chest pain is stable and so is her body pain, especially in her left  leg. -Ipreviouslyprescribed  herGabapentin, but decided not to take due to potential side effect of suicidal thoughts.  -Chest pain resolved -She mostly only has body aches, but has new right hip pain. She only takes Oxycodone 57m as needed (every a few days)   3.Cough -Recent CT chest was also negative.But showsatelectasis at the lung base -Hycodanand Tessalonhas not been relieving her. -I encouraged her to practice taking deep breaths to open up her lungs.  -On Flonase BID, albuterol inhaler and Robitussin BID, will continue.  -Mild dry cough now that is resolving   4.Congenital deafness  5.Mild anemia, secondary to chemotherapy and malignancy -Stable, Hgimproved to 10.6 today (03/18/18)  6.Hypokalemia -He has intermittent hyperkalemia, on oral supplementKCL, continue 262m BID -N&V and Diarrhea has worsened in the past 2 weeks.  -will increase K supplement if low today   7.Goal of care discussion -she understand her goal of care is palliative to prolong her life -she is full code for now  8.Insomnia -She cannot take Benadryl due to allergy reaction. She has tried trazodone with no relief.  -I previously prescribed Ambien 48m40m  9. N&V and Diarrhea, weight loss  -Has been significant the last 2 weeks -Liley related to Perjeta, will hold today  -I encouraged her to increase her lomotil to 2 tabs at a time up to 8 tabs a day along with imodium up to 8 tabs a day, but she feels they are not helping  -She has not been eating much and has lost weight. I strongly encouraged her to increase nutritional supplement and to reduce fiber rich foods.    PLAN: -Labs reviewed and adequate to proceed with taxol, Herceptin today, will hold Perjeta today due to her recent severe diarrhea -She will return next week for Taxol  -Flush and CT CAP with contrast (iv contrast only) around 3/18, and f/u on 3/20      No problem-specific Assessment & Plan notes found for this encounter.   Orders  Placed This Encounter  Procedures  . CT Abdomen Pelvis W Contrast    IV CONTRAST ONLY - PATIENT CANNOT TOLERATE ORAL CONTRAST    Standing Status:   Future    Standing Expiration Date:   03/18/2019    Order Specific Question:   If indicated for the ordered procedure, I authorize the administration of contrast media per Radiology protocol    Answer:   Yes    Order Specific Question:   Is patient pregnant?    Answer:   No    Order Specific Question:   Preferred [facility]?    [facility] Order Specific Question:   Is Oral Contrast requested for this exam?    Answer:   Yes, Per Radiology protocol    Order Specific Question:   Radiology Contrast Protocol - do NOT remove file path    Answer:   _0 [email or web address]  . CT Chest W Contrast    Standing Status:   Future    Standing Expiration Date:   03/18/2019    Order Specific Question:   If indicated for the ordered procedure, I authorize the administration of contrast media per Radiology protocol    Answer:   Yes    Order Specific Question:   Is patient pregnant?    Answer:   No    Order Specific Question:   Preferred imaging location?    [facility] Order Specific Question:  Radiology Contrast Protocol - do NOT remove file path    Answer:   _0 [email or web address]  . NM Bone Scan Whole Body    Standing Status:   Future    Standing Expiration Date:   03/18/2019    Order Specific Question:   If indicated for the ordered procedure, I authorize the administration of a radiopharmaceutical per Radiology protocol    Answer:   Yes    Order Specific Question:   Is the patient pregnant?    Answer:   No    Order Specific Question:   Preferred imaging location?    Answer:   [facility]    Order Specific Question:   Radiology Contrast Protocol - do NOT remove file path    Answer:   _1 [email or web address].pdf   All questions were  answered. The patient knows to call the clinic with any problems, questions or concerns. No barriers to learning was detected. I spent 25 minutes counseling the patient face to face. The total time spent in the appointment was 30 minutes and more than 50% was on counseling and review of test results     Truitt Merle, MD 03/18/2018   I, Joslyn Devon, am acting as scribe for Truitt Merle, MD.   I have reviewed the above documentation for accuracy and completeness, and I agree with the above.    Addendum Her K 2.7 today, I will give IV potassium chloride 20 mEq today, and she will increase oral potassium to 2 tablets twice daily for the next 3 days, then back to 1 tablet twice daily.  Truitt Merle  03/18/2018

## 2018-03-18 ENCOUNTER — Encounter: Payer: Self-pay | Admitting: Hematology

## 2018-03-18 ENCOUNTER — Inpatient Hospital Stay: Payer: Medicaid Other

## 2018-03-18 ENCOUNTER — Telehealth: Payer: Self-pay | Admitting: Hematology

## 2018-03-18 ENCOUNTER — Inpatient Hospital Stay (HOSPITAL_BASED_OUTPATIENT_CLINIC_OR_DEPARTMENT_OTHER): Payer: Medicaid Other | Admitting: Hematology

## 2018-03-18 ENCOUNTER — Inpatient Hospital Stay: Payer: Medicaid Other | Attending: Hematology

## 2018-03-18 ENCOUNTER — Other Ambulatory Visit: Payer: Self-pay

## 2018-03-18 VITALS — BP 124/68 | HR 86 | Temp 98.1°F | Resp 18 | Ht 65.0 in | Wt 126.7 lb

## 2018-03-18 DIAGNOSIS — Z95828 Presence of other vascular implants and grafts: Secondary | ICD-10-CM

## 2018-03-18 DIAGNOSIS — Z9221 Personal history of antineoplastic chemotherapy: Secondary | ICD-10-CM | POA: Diagnosis not present

## 2018-03-18 DIAGNOSIS — Z170 Estrogen receptor positive status [ER+]: Secondary | ICD-10-CM | POA: Insufficient documentation

## 2018-03-18 DIAGNOSIS — E876 Hypokalemia: Secondary | ICD-10-CM

## 2018-03-18 DIAGNOSIS — C7951 Secondary malignant neoplasm of bone: Secondary | ICD-10-CM

## 2018-03-18 DIAGNOSIS — C799 Secondary malignant neoplasm of unspecified site: Secondary | ICD-10-CM

## 2018-03-18 DIAGNOSIS — Z79899 Other long term (current) drug therapy: Secondary | ICD-10-CM

## 2018-03-18 DIAGNOSIS — Z923 Personal history of irradiation: Secondary | ICD-10-CM

## 2018-03-18 DIAGNOSIS — D6481 Anemia due to antineoplastic chemotherapy: Secondary | ICD-10-CM

## 2018-03-18 DIAGNOSIS — C50911 Malignant neoplasm of unspecified site of right female breast: Secondary | ICD-10-CM

## 2018-03-18 DIAGNOSIS — T451X5D Adverse effect of antineoplastic and immunosuppressive drugs, subsequent encounter: Secondary | ICD-10-CM | POA: Diagnosis not present

## 2018-03-18 DIAGNOSIS — H905 Unspecified sensorineural hearing loss: Secondary | ICD-10-CM | POA: Insufficient documentation

## 2018-03-18 DIAGNOSIS — C50919 Malignant neoplasm of unspecified site of unspecified female breast: Secondary | ICD-10-CM

## 2018-03-18 DIAGNOSIS — G4700 Insomnia, unspecified: Secondary | ICD-10-CM | POA: Diagnosis not present

## 2018-03-18 DIAGNOSIS — G893 Neoplasm related pain (acute) (chronic): Secondary | ICD-10-CM

## 2018-03-18 DIAGNOSIS — Z5111 Encounter for antineoplastic chemotherapy: Secondary | ICD-10-CM | POA: Diagnosis not present

## 2018-03-18 LAB — COMPREHENSIVE METABOLIC PANEL
ALT: 15 U/L (ref 0–44)
AST: 16 U/L (ref 15–41)
Albumin: 3.6 g/dL (ref 3.5–5.0)
Alkaline Phosphatase: 70 U/L (ref 38–126)
Anion gap: 9 (ref 5–15)
BUN: 8 mg/dL (ref 6–20)
CO2: 22 mmol/L (ref 22–32)
Calcium: 8.7 mg/dL — ABNORMAL LOW (ref 8.9–10.3)
Chloride: 108 mmol/L (ref 98–111)
Creatinine, Ser: 0.76 mg/dL (ref 0.44–1.00)
GFR calc Af Amer: >60 mL/min (ref >60)
GFR calc non Af Amer: >60 mL/min (ref >60)
Glucose, Bld: 89 mg/dL (ref 70–99)
Potassium: 2.7 mmol/L — CL (ref 3.5–5.1)
Sodium: 139 mmol/L (ref 135–145)
Total Bilirubin: 1.3 mg/dL — ABNORMAL HIGH (ref 0.3–1.2)
Total Protein: 6.7 g/dL (ref 6.5–8.1)

## 2018-03-18 LAB — CBC WITH DIFFERENTIAL/PLATELET
Abs Immature Granulocytes: 0.05 10*3/uL (ref 0.00–0.07)
BASOS PCT: 1 %
Basophils Absolute: 0 10*3/uL (ref 0.0–0.1)
EOS ABS: 0.1 10*3/uL (ref 0.0–0.5)
Eosinophils Relative: 1 %
HCT: 32.6 % — ABNORMAL LOW (ref 36.0–46.0)
Hemoglobin: 10.6 g/dL — ABNORMAL LOW (ref 12.0–15.0)
Immature Granulocytes: 1 %
Lymphocytes Relative: 18 %
Lymphs Abs: 1 10*3/uL (ref 0.7–4.0)
MCH: 28.6 pg (ref 26.0–34.0)
MCHC: 32.5 g/dL (ref 30.0–36.0)
MCV: 88.1 fL (ref 80.0–100.0)
Monocytes Absolute: 0.5 10*3/uL (ref 0.1–1.0)
Monocytes Relative: 8 %
Neutro Abs: 3.9 10*3/uL (ref 1.7–7.7)
Neutrophils Relative %: 71 %
PLATELETS: 209 10*3/uL (ref 150–400)
RBC: 3.7 MIL/uL — ABNORMAL LOW (ref 3.87–5.11)
RDW: 14.8 % (ref 11.5–15.5)
WBC: 5.5 10*3/uL (ref 4.0–10.5)
nRBC: 0 % (ref 0.0–0.2)

## 2018-03-18 MED ORDER — LORATADINE 10 MG PO TABS
10.0000 mg | ORAL_TABLET | Freq: Every day | ORAL | Status: DC
  Administered 2018-03-18: 10 mg via ORAL

## 2018-03-18 MED ORDER — SODIUM CHLORIDE 0.9% FLUSH
10.0000 mL | INTRAVENOUS | Status: DC | PRN
  Administered 2018-03-18: 10 mL
  Filled 2018-03-18: qty 10

## 2018-03-18 MED ORDER — DEXAMETHASONE SODIUM PHOSPHATE 10 MG/ML IJ SOLN
10.0000 mg | Freq: Once | INTRAMUSCULAR | Status: AC
  Administered 2018-03-18: 10 mg via INTRAVENOUS

## 2018-03-18 MED ORDER — SODIUM CHLORIDE 0.9 % IV SOLN
20.0000 mg | Freq: Once | INTRAVENOUS | Status: AC
  Administered 2018-03-18: 20 mg via INTRAVENOUS
  Filled 2018-03-18: qty 2

## 2018-03-18 MED ORDER — POTASSIUM CHLORIDE 10 MEQ/100ML IV SOLN
10.0000 meq | INTRAVENOUS | Status: AC
  Administered 2018-03-18 (×2): 10 meq via INTRAVENOUS
  Filled 2018-03-18: qty 100

## 2018-03-18 MED ORDER — ACETAMINOPHEN 325 MG PO TABS
650.0000 mg | ORAL_TABLET | Freq: Once | ORAL | Status: AC
  Administered 2018-03-18: 650 mg via ORAL

## 2018-03-18 MED ORDER — LORATADINE 10 MG PO TABS
ORAL_TABLET | ORAL | Status: AC
  Filled 2018-03-18: qty 1

## 2018-03-18 MED ORDER — POTASSIUM CHLORIDE 10 MEQ/100ML IV SOLN: 10.0000 meq | INTRAVENOUS | Status: DC

## 2018-03-18 MED ORDER — TRASTUZUMAB CHEMO 150 MG IV SOLR
6.0000 mg/kg | Freq: Once | INTRAVENOUS | Status: AC
  Administered 2018-03-18: 336 mg via INTRAVENOUS
  Filled 2018-03-18: qty 16

## 2018-03-18 MED ORDER — SODIUM CHLORIDE 0.9 % IV SOLN
80.0000 mg/m2 | Freq: Once | INTRAVENOUS | Status: AC
  Administered 2018-03-18: 132 mg via INTRAVENOUS
  Filled 2018-03-18: qty 22

## 2018-03-18 MED ORDER — ZOLPIDEM TARTRATE 5 MG PO TABS: 5.0000 mg | ORAL_TABLET | Freq: Every evening | ORAL | 0 refills | Status: DC | PRN

## 2018-03-18 MED ORDER — SODIUM CHLORIDE 0.9% FLUSH
10.0000 mL | INTRAVENOUS | Status: DC | PRN
  Administered 2018-03-18: 10 mL via INTRAVENOUS
  Filled 2018-03-18: qty 10

## 2018-03-18 MED ORDER — LIDOCAINE-PRILOCAINE 2.5-2.5 % EX CREA: 1.0000 "application " | TOPICAL_CREAM | CUTANEOUS | 2 refills | Status: DC | PRN

## 2018-03-18 MED ORDER — SODIUM CHLORIDE 0.9 % IV SOLN
Freq: Once | INTRAVENOUS | Status: AC
  Administered 2018-03-18: 10:00:00 via INTRAVENOUS
  Filled 2018-03-18: qty 250

## 2018-03-18 MED ORDER — ALPRAZOLAM 0.25 MG PO TABS: 0.2500 mg | ORAL_TABLET | Freq: Two times a day (BID) | ORAL | 0 refills | Status: DC | PRN

## 2018-03-18 MED ORDER — HEPARIN SOD (PORK) LOCK FLUSH 100 UNIT/ML IV SOLN
500.0000 [IU] | Freq: Once | INTRAVENOUS | Status: AC | PRN
  Administered 2018-03-18: 500 [IU]
  Filled 2018-03-18: qty 5

## 2018-03-18 MED ORDER — FAMOTIDINE IN NACL 20-0.9 MG/50ML-% IV SOLN: 20.0000 mg | Freq: Once | INTRAVENOUS | Status: DC

## 2018-03-18 MED ORDER — DEXAMETHASONE SODIUM PHOSPHATE 10 MG/ML IJ SOLN
INTRAMUSCULAR | Status: AC
  Filled 2018-03-18: qty 1

## 2018-03-18 MED ORDER — ACETAMINOPHEN 325 MG PO TABS
ORAL_TABLET | ORAL | Status: AC
  Filled 2018-03-18: qty 2

## 2018-03-18 NOTE — Addendum Note (Signed)
Addended by: Truitt Merle on: 03/18/2018 10:08 AM   Modules accepted: Orders

## 2018-03-18 NOTE — Addendum Note (Signed)
Addended by: Truitt Merle on: 03/18/2018 03:10 PM   Modules accepted: Orders

## 2018-03-18 NOTE — Patient Instructions (Addendum)
[name] Discharge Instructions for Patients Receiving Chemotherapy  Today you received the following chemotherapy agents:  Paclitaxel (Taxol)  To help prevent nausea and vomiting after your treatment, we encourage you to take your nausea medication as directed.    If you develop nausea and vomiting that is not controlled by your nausea medication, call the clinic.   BELOW ARE SYMPTOMS THAT SHOULD BE REPORTED IMMEDIATELY:  *FEVER GREATER THAN 100.5 F  *CHILLS WITH OR WITHOUT FEVER  NAUSEA AND VOMITING THAT IS NOT CONTROLLED WITH YOUR NAUSEA MEDICATION  *UNUSUAL SHORTNESS OF BREATH  *UNUSUAL BRUISING OR BLEEDING  TENDERNESS IN MOUTH AND THROAT WITH OR WITHOUT PRESENCE OF ULCERS  *URINARY PROBLEMS  *BOWEL PROBLEMS  UNUSUAL RASH Items with * indicate a potential emergency and should be followed up as soon as possible.  Feel free to call the clinic should you have any questions or concerns. The clinic phone number is [phone].  Please show the [place] at check-in to the Emergency Department and triage nurse.   Hypokalemia Hypokalemia means that the amount of potassium in the blood is lower than normal.Potassium is a chemical that helps regulate the amount of fluid in the body (electrolyte). It also stimulates muscle tightening (contraction) and helps nerves work properly.Normally, most of the body's potassium is inside of cells, and only a very small amount is in the blood. Because the amount in the blood is so small, minor changes to potassium levels in the blood can be life-threatening. What are the causes? This condition may be caused by:  Antibiotic medicine.  Diarrhea or vomiting. Taking too much of a medicine that helps you have a bowel movement (laxative) can cause diarrhea and lead to hypokalemia.  Chronic kidney disease (CKD).  Medicines that help the body get rid of excess fluid (diuretics).  Eating disorders, such as  bulimia.  Low magnesium levels in the body.  Sweating a lot. What are the signs or symptoms? Symptoms of this condition include:  Weakness.  Constipation.  Fatigue.  Muscle cramps.  Mental confusion.  Skipped heartbeats or irregular heartbeat (palpitations).  Tingling or numbness. How is this diagnosed? This condition is diagnosed with a blood test. How is this treated? Hypokalemia can be treated by taking potassium supplements by mouth or adjusting the medicines that you take. Treatment may also include eating more foods that contain a lot of potassium. If your potassium level is very low, you may need to get potassium through an IV tube in one of your veins and be monitored in the hospital. Follow these instructions at home:   Take over-the-counter and prescription medicines only as told by your health care provider. This includes vitamins and supplements.  Eat a healthy diet. A healthy diet includes fresh fruits and vegetables, whole grains, healthy fats, and lean proteins.  If instructed, eat more foods that contain a lot of potassium, such as: ? Nuts, such as peanuts and pistachios. ? Seeds, such as sunflower seeds and pumpkin seeds. ? Peas, lentils, and lima beans. ? Whole grain and bran cereals and breads. ? Fresh fruits and vegetables, such as apricots, avocado, bananas, cantaloupe, kiwi, oranges, tomatoes, asparagus, and potatoes. ? Orange juice. ? Tomato juice. ? Red meats. ? Yogurt.  Keep all follow-up visits as told by your health care provider. This is important. Contact a health care provider if:  You have weakness that gets worse.  You feel your heart pounding or racing.  You vomit.  You have  diarrhea.  You have diabetes (diabetes mellitus) and you have trouble keeping your blood sugar (glucose) in your target range. Get help right away if:  You have chest pain.  You have shortness of breath.  You have vomiting or diarrhea that lasts for  more than 2 days.  You faint. This information is not intended to replace advice given to you by your health care provider. Make sure you discuss any questions you have with your health care provider. Document Released: 12/29/2004 Document Revised: 08/17/2015 Document Reviewed: 08/17/2015 Elsevier Interactive Patient Education  2019 [employer].  Potassium chloride injection What is this medicine? POTASSIUM CHLORIDE (poe TASS i um KLOOR ide) is a potassium supplement used to prevent and to treat low potassium. Potassium is important for the heart, muscles, and nerves. Too much or too little potassium in the body can cause serious problems. This medicine may be used for other purposes; ask your health care provider or pharmacist if you have questions. COMMON BRAND NAME(S): PROAMP What should I tell my health care provider before I take this medicine? They need to know if you have any of these conditions: -Addison's disease -dehydration -diabetes -heart disease -high levels of potassium in the blood -irregular heartbeat -kidney disease -recent severe burn -an unusual or allergic reaction to potassium, other medicines, foods, dyes, or preservatives -pregnant or trying to get pregnant -breast-feeding How should I use this medicine? This medicine is for infusion into a vein. It is given by a health care professional in a hospital or clinic setting. Talk to your pediatrician regarding the use of this medicine in children. Special care may be needed. Overdosage: If you think you have taken too much of this medicine contact a poison control center or emergency room at once. NOTE: This medicine is only for you. Do not share this medicine with others. What if I miss a dose? This does not apply. What may interact with this medicine? Do not take this medicine with any of the following medications: -certain diuretics such as spironolactone, triamterene -eplerenone -sodium polystyrene  sulfonate This medicine may also interact with the following medications: -certain medicines for blood pressure or heart disease like lisinopril, losartan, quinapril, valsartan -medicines that lower your chance of fighting infection such as cyclosporine, tacrolimus -NSAIDs, medicines for pain and inflammation, like ibuprofen or naproxen -other potassium supplements -salt substitutes This list may not describe all possible interactions. Give your health care provider a list of all the medicines, herbs, non-prescription drugs, or dietary supplements you use. Also tell them if you smoke, drink alcohol, or use illegal drugs. Some items may interact with your medicine. What should I watch for while using this medicine? Your condition will be monitored carefully while you are receiving this medicine. You may need blood work done while you are taking this medicine. What side effects may I notice from receiving this medicine? Side effects that you should report to your doctor or health care professional as soon as possible: -allergic reactions like skin rash, itching or hives, swelling of the face, lips, or tongue -breathing problems -confusion -fast, irregular heartbeat -feeling faint or lightheaded, falls -low blood pressure -numbness or tingling in hands or feet -pain, redness, or irritation at site where injected -unusually weak or tired -weakness, heaviness of legs Side effects that usually do not require medical attention (report to your doctor or health care professional if they continue or are bothersome): -diarrhea -nausea, vomiting -stomach pain This list may not describe all possible side effects. Call  your doctor for medical advice about side effects. You may report side effects to FDA at 1-800-FDA-1088. Where should I keep my medicine? This drug is given in a hospital or clinic and will not be stored at home. NOTE: This sheet is a summary. It may not cover all possible information.  If you have questions about this medicine, talk to your doctor, pharmacist, or health care provider.  2019 Elsevier/Gold Standard (2015-10-02 13:59:20)

## 2018-03-18 NOTE — Telephone Encounter (Signed)
Scheduled appt 3/6 los.  Printed calendar and avs.

## 2018-03-18 NOTE — Progress Notes (Unsigned)
Critical K called to [name] at [phone].rb

## 2018-03-19 LAB — CANCER ANTIGEN 27.29: CA 27.29: 41.6 U/mL — ABNORMAL HIGH (ref 0.0–38.6)

## 2018-03-25 ENCOUNTER — Telehealth: Payer: Self-pay | Admitting: *Deleted

## 2018-03-25 ENCOUNTER — Other Ambulatory Visit: Payer: Self-pay

## 2018-03-25 ENCOUNTER — Inpatient Hospital Stay: Payer: Medicaid Other

## 2018-03-25 ENCOUNTER — Other Ambulatory Visit: Payer: Self-pay | Admitting: Hematology

## 2018-03-25 ENCOUNTER — Other Ambulatory Visit: Payer: Self-pay | Admitting: Medical

## 2018-03-25 VITALS — BP 122/78 | HR 81 | Temp 97.7°F | Resp 18 | Ht 65.0 in | Wt 127.0 lb

## 2018-03-25 DIAGNOSIS — R197 Diarrhea, unspecified: Secondary | ICD-10-CM

## 2018-03-25 DIAGNOSIS — C7951 Secondary malignant neoplasm of bone: Secondary | ICD-10-CM

## 2018-03-25 DIAGNOSIS — E876 Hypokalemia: Secondary | ICD-10-CM

## 2018-03-25 DIAGNOSIS — C50911 Malignant neoplasm of unspecified site of right female breast: Secondary | ICD-10-CM

## 2018-03-25 DIAGNOSIS — C799 Secondary malignant neoplasm of unspecified site: Principal | ICD-10-CM

## 2018-03-25 DIAGNOSIS — A09 Infectious gastroenteritis and colitis, unspecified: Secondary | ICD-10-CM

## 2018-03-25 DIAGNOSIS — C50919 Malignant neoplasm of unspecified site of unspecified female breast: Secondary | ICD-10-CM

## 2018-03-25 DIAGNOSIS — Z95828 Presence of other vascular implants and grafts: Secondary | ICD-10-CM

## 2018-03-25 DIAGNOSIS — Z5111 Encounter for antineoplastic chemotherapy: Secondary | ICD-10-CM | POA: Diagnosis not present

## 2018-03-25 LAB — CBC WITH DIFFERENTIAL/PLATELET
Abs Immature Granulocytes: 0.06 10*3/uL (ref 0.00–0.07)
BASOS PCT: 1 %
Basophils Absolute: 0 10*3/uL (ref 0.0–0.1)
Eosinophils Absolute: 0.1 10*3/uL (ref 0.0–0.5)
Eosinophils Relative: 2 %
HCT: 31.9 % — ABNORMAL LOW (ref 36.0–46.0)
Hemoglobin: 10.3 g/dL — ABNORMAL LOW (ref 12.0–15.0)
Immature Granulocytes: 2 %
Lymphocytes Relative: 25 %
Lymphs Abs: 1 10*3/uL (ref 0.7–4.0)
MCH: 28.3 pg (ref 26.0–34.0)
MCHC: 32.3 g/dL (ref 30.0–36.0)
MCV: 87.6 fL (ref 80.0–100.0)
Monocytes Absolute: 0.3 10*3/uL (ref 0.1–1.0)
Monocytes Relative: 7 %
Neutro Abs: 2.6 10*3/uL (ref 1.7–7.7)
Neutrophils Relative %: 63 %
Platelets: 222 10*3/uL (ref 150–400)
RBC: 3.64 MIL/uL — AB (ref 3.87–5.11)
RDW: 14.5 % (ref 11.5–15.5)
WBC: 4.1 10*3/uL (ref 4.0–10.5)
nRBC: 0 % (ref 0.0–0.2)

## 2018-03-25 LAB — CMP (CANCER CENTER ONLY)
ALT: 14 U/L (ref 0–44)
AST: 12 U/L — ABNORMAL LOW (ref 15–41)
Albumin: 3.2 g/dL — ABNORMAL LOW (ref 3.5–5.0)
Alkaline Phosphatase: 71 U/L (ref 38–126)
Anion gap: 10 (ref 5–15)
BUN: 8 mg/dL (ref 6–20)
CO2: 22 mmol/L (ref 22–32)
Calcium: 8.7 mg/dL — ABNORMAL LOW (ref 8.9–10.3)
Chloride: 107 mmol/L (ref 98–111)
Creatinine: 0.79 mg/dL (ref 0.44–1.00)
GFR, Est AFR Am: >60 mL/min (ref >60)
GFR, Estimated: >60 mL/min (ref >60)
Glucose, Bld: 84 mg/dL (ref 70–99)
Potassium: 3 mmol/L — CL (ref 3.5–5.1)
SODIUM: 139 mmol/L (ref 135–145)
Total Bilirubin: 1.9 mg/dL — ABNORMAL HIGH (ref 0.3–1.2)
Total Protein: 6.3 g/dL — ABNORMAL LOW (ref 6.5–8.1)

## 2018-03-25 LAB — C DIFFICILE QUICK SCREEN W PCR REFLEX
C Diff antigen: NEGATIVE
C Diff interpretation: NOT DETECTED
C Diff toxin: NEGATIVE

## 2018-03-25 MED ORDER — OXYCODONE HCL 5 MG PO TABS: 5.0000 mg | ORAL_TABLET | Freq: Four times a day (QID) | ORAL | 0 refills | Status: DC | PRN

## 2018-03-25 MED ORDER — SODIUM CHLORIDE 0.9% FLUSH
10.0000 mL | Freq: Once | INTRAVENOUS | Status: AC
  Administered 2018-03-25: 10 mL
  Filled 2018-03-25: qty 10

## 2018-03-25 MED ORDER — SODIUM CHLORIDE 0.9 % IV SOLN
20.0000 mg | Freq: Once | INTRAVENOUS | Status: AC
  Administered 2018-03-25: 20 mg via INTRAVENOUS
  Filled 2018-03-25: qty 2

## 2018-03-25 MED ORDER — SODIUM CHLORIDE 0.9 % IV SOLN
Freq: Once | INTRAVENOUS | Status: AC
  Administered 2018-03-25: 10:00:00 via INTRAVENOUS
  Filled 2018-03-25: qty 250

## 2018-03-25 MED ORDER — SODIUM CHLORIDE 0.9 % IV SOLN
Freq: Once | INTRAVENOUS | Status: AC
  Administered 2018-03-25: 11:00:00 via INTRAVENOUS
  Filled 2018-03-25: qty 1000

## 2018-03-25 MED ORDER — SODIUM CHLORIDE 0.9% FLUSH
10.0000 mL | INTRAVENOUS | Status: DC | PRN
  Administered 2018-03-25: 10 mL
  Filled 2018-03-25: qty 10

## 2018-03-25 MED ORDER — DEXAMETHASONE SODIUM PHOSPHATE 10 MG/ML IJ SOLN
10.0000 mg | Freq: Once | INTRAMUSCULAR | Status: AC
  Administered 2018-03-25: 10 mg via INTRAVENOUS

## 2018-03-25 MED ORDER — DEXAMETHASONE SODIUM PHOSPHATE 10 MG/ML IJ SOLN
INTRAMUSCULAR | Status: AC
  Filled 2018-03-25: qty 1

## 2018-03-25 MED ORDER — FAMOTIDINE IN NACL 20-0.9 MG/50ML-% IV SOLN: 20.0000 mg | Freq: Once | INTRAVENOUS | Status: DC

## 2018-03-25 MED ORDER — POTASSIUM CHLORIDE 10 MEQ/100ML IV SOLN
10.0000 meq | INTRAVENOUS | Status: DC
  Filled 2018-03-25: qty 100

## 2018-03-25 MED ORDER — HEPARIN SOD (PORK) LOCK FLUSH 100 UNIT/ML IV SOLN
500.0000 [IU] | Freq: Once | INTRAVENOUS | Status: AC | PRN
  Administered 2018-03-25: 500 [IU]
  Filled 2018-03-25: qty 5

## 2018-03-25 MED ORDER — SODIUM CHLORIDE 0.9 % IV SOLN
60.0000 mg/m2 | Freq: Once | INTRAVENOUS | Status: AC
  Administered 2018-03-25: 96 mg via INTRAVENOUS
  Filled 2018-03-25: qty 16

## 2018-03-25 NOTE — Progress Notes (Signed)
Stool specimen delivered to lab.

## 2018-03-25 NOTE — Patient Instructions (Addendum)
[place] Discharge Instructions for Patients Receiving Chemotherapy  Today you received the following chemotherapy agents: Paclitaxel (Taxol)  To help prevent nausea and vomiting after your treatment, we encourage you to take your nausea medication as directed.    If you develop nausea and vomiting that is not controlled by your nausea medication, call the clinic.   BELOW ARE SYMPTOMS THAT SHOULD BE REPORTED IMMEDIATELY:  *FEVER GREATER THAN 100.5 F  *CHILLS WITH OR WITHOUT FEVER  NAUSEA AND VOMITING THAT IS NOT CONTROLLED WITH YOUR NAUSEA MEDICATION  *UNUSUAL SHORTNESS OF BREATH  *UNUSUAL BRUISING OR BLEEDING  TENDERNESS IN MOUTH AND THROAT WITH OR WITHOUT PRESENCE OF ULCERS  *URINARY PROBLEMS  *BOWEL PROBLEMS  UNUSUAL RASH Items with * indicate a potential emergency and should be followed up as soon as possible.  Feel free to call the clinic should you have any questions or concerns. The clinic phone number is [phone].  Please show the [place] at check-in to the Emergency Department and triage nurse.  Hypokalemia Hypokalemia means that the amount of potassium in the blood is lower than normal.Potassium is a chemical that helps regulate the amount of fluid in the body (electrolyte). It also stimulates muscle tightening (contraction) and helps nerves work properly.Normally, most of the body's potassium is inside of cells, and only a very small amount is in the blood. Because the amount in the blood is so small, minor changes to potassium levels in the blood can be life-threatening. What are the causes? This condition may be caused by:  Antibiotic medicine.  Diarrhea or vomiting. Taking too much of a medicine that helps you have a bowel movement (laxative) can cause diarrhea and lead to hypokalemia.  Chronic kidney disease (CKD).  Medicines that help the body get rid of excess fluid (diuretics).  Eating disorders, such as  bulimia.  Low magnesium levels in the body.  Sweating a lot. What are the signs or symptoms? Symptoms of this condition include:  Weakness.  Constipation.  Fatigue.  Muscle cramps.  Mental confusion.  Skipped heartbeats or irregular heartbeat (palpitations).  Tingling or numbness. How is this diagnosed? This condition is diagnosed with a blood test. How is this treated? Hypokalemia can be treated by taking potassium supplements by mouth or adjusting the medicines that you take. Treatment may also include eating more foods that contain a lot of potassium. If your potassium level is very low, you may need to get potassium through an IV tube in one of your veins and be monitored in the hospital. Follow these instructions at home:   Take over-the-counter and prescription medicines only as told by your health care provider. This includes vitamins and supplements.  Eat a healthy diet. A healthy diet includes fresh fruits and vegetables, whole grains, healthy fats, and lean proteins.  If instructed, eat more foods that contain a lot of potassium, such as: ? Nuts, such as peanuts and pistachios. ? Seeds, such as sunflower seeds and pumpkin seeds. ? Peas, lentils, and lima beans. ? Whole grain and bran cereals and breads. ? Fresh fruits and vegetables, such as apricots, avocado, bananas, cantaloupe, kiwi, oranges, tomatoes, asparagus, and potatoes. ? Orange juice. ? Tomato juice. ? Red meats. ? Yogurt.  Keep all follow-up visits as told by your health care provider. This is important. Contact a health care provider if:  You have weakness that gets worse.  You feel your heart pounding or racing.  You vomit.  You have diarrhea.  You have diabetes (diabetes mellitus) and you have trouble keeping your blood sugar (glucose) in your target range. Get help right away if:  You have chest pain.  You have shortness of breath.  You have vomiting or diarrhea that lasts for  more than 2 days.  You faint. This information is not intended to replace advice given to you by your health care provider. Make sure you discuss any questions you have with your health care provider. Document Released: 12/29/2004 Document Revised: 08/17/2015 Document Reviewed: 08/17/2015 Elsevier Interactive Patient Education  2019 [employer].

## 2018-03-25 NOTE — Telephone Encounter (Signed)
Received Rhonda MT call report of today's K+ = 3.0.  Message left with results.  Scheduled 9:00 am infusion today.  Further communication through infusion nurse per treatment protocol standards.

## 2018-03-25 NOTE — Telephone Encounter (Signed)
I saw pt in infusion room this morning, and gave her IVF and IV K 63meq. I instructed her to increase KCL to 2 tabs daily from now on. Due to her persistent diarrhea, I requested c-diff test. Will decrease her Taxol to 60mg /m2 today. She is scheduled for restaging CT/BONE scan next week and see me back in a week.   Truitt Merle MD

## 2018-03-25 NOTE — Progress Notes (Signed)
Per Dr. Burr Medico: OK to treat with bilirubin of 1.9. Taxol will be dose reduced. Will also receive 20 mEq of potassium IV today. Pt and interpreter updated on plan

## 2018-03-25 NOTE — Progress Notes (Signed)
c diff

## 2018-03-28 ENCOUNTER — Telehealth: Payer: Self-pay

## 2018-03-28 NOTE — Telephone Encounter (Signed)
Left voice message for this patient regarding results for her stool sample, per Dr. Burr Medico it was negative for C-diff.

## 2018-03-30 ENCOUNTER — Other Ambulatory Visit: Payer: Self-pay

## 2018-03-30 ENCOUNTER — Encounter (HOSPITAL_COMMUNITY): Payer: Self-pay

## 2018-03-30 ENCOUNTER — Encounter (HOSPITAL_COMMUNITY)
Admission: RE | Admit: 2018-03-30 | Discharge: 2018-03-30 | Disposition: A | Discharge status: Home / Self Care | Payer: Medicaid Other | Source: Ambulatory Visit | Attending: Hematology | Admitting: Hematology

## 2018-03-30 ENCOUNTER — Ambulatory Visit (HOSPITAL_COMMUNITY)
Admission: RE | Admit: 2018-03-30 | Discharge: 2018-03-30 | Disposition: A | Discharge status: Home / Self Care | Payer: Medicaid Other | Source: Ambulatory Visit | Attending: Hematology | Admitting: Hematology

## 2018-03-30 DIAGNOSIS — C7951 Secondary malignant neoplasm of bone: Secondary | ICD-10-CM | POA: Insufficient documentation

## 2018-03-30 DIAGNOSIS — C50911 Malignant neoplasm of unspecified site of right female breast: Secondary | ICD-10-CM | POA: Insufficient documentation

## 2018-03-30 DIAGNOSIS — K769 Liver disease, unspecified: Secondary | ICD-10-CM | POA: Diagnosis not present

## 2018-03-30 MED ORDER — HEPARIN SOD (PORK) LOCK FLUSH 100 UNIT/ML IV SOLN
INTRAVENOUS | Status: AC
  Administered 2018-03-30: 500 [IU]
  Filled 2018-03-30: qty 5

## 2018-03-30 MED ORDER — HEPARIN SOD (PORK) LOCK FLUSH 100 UNIT/ML IV SOLN
500.0000 [IU] | Freq: Once | INTRAVENOUS | Status: AC
  Administered 2018-03-30: 500 [IU]

## 2018-03-30 MED ORDER — SODIUM CHLORIDE (PF) 0.9 % IJ SOLN
INTRAMUSCULAR | Status: AC
  Filled 2018-03-30: qty 50

## 2018-03-30 MED ORDER — TECHNETIUM TC 99M MEDRONATE IV KIT
20.0000 | PACK | Freq: Once | INTRAVENOUS | Status: AC | PRN
  Administered 2018-03-30: 20.3 via INTRAVENOUS

## 2018-03-30 MED ORDER — IOHEXOL 300 MG/ML  SOLN
100.0000 mL | Freq: Once | INTRAMUSCULAR | Status: AC | PRN
  Administered 2018-03-30: 100 mL via INTRAVENOUS

## 2018-03-30 NOTE — Progress Notes (Addendum)
[facility]   [phone] Fax:(336) [phone]   Clinic Follow up Note   Patient Care Team: [name], [name], [name] as PCP - General (Physician Assistant) [name], MD as Consulting Physician (Orthopedic Surgery)  Date of Service:  04/01/2018  CHIEF COMPLAINT: Diarrhea and vomiting   SUMMARY OF ONCOLOGIC HISTORY: Oncology History    Cancer Staging No matching staging information was found for the patient.       [name], right ([place])   11/1997 Cancer Diagnosis    Patient had 1.4 cm poorly differentiated right breast carcinoma diagnosed in Nov 1999 at age 68, 1/7 axillary nodes involved, ER/PR and HER 2 positive. She had mastectomy with the 7 axillary node evaluation, 4 cycles of adriamycin/ cytoxan followed by taxotere, then five years of tamoxifen thru June 2005 (no herceptin in 1999). She had bilateral oophorectomy in May 2005. She was briefly on aromatase inhibitor after tamoxifen, but discontinued this herself due to poor tolerance.    04/04/2015 Genetic Testing    Negative genetic testing on the breast/ovarian cancer pnael.  The Breast/Ovarian gene panel offered by [facility] includes sequencing and rearrangement analysis for the following 20 genes:  ATM, BARD1, BRCA1, BRCA2, BRIP1, CDH1, CHEK2, EPCAM, FANCC, MLH1, MSH2, MSH6, NBN, PALB2, PMS2, PTEN, RAD51C, RAD51D, TP53, and XRCC2.    06/26/2015 Pathology Results    Initial Path Report Bone, curettage, Left femur met, intramedullary subtroch tissue - METASTATIC ADENOCARCINOMA.  Estrogen Receptor: 95%, POSITIVE, STRONG STAINING INTENSITY Progesterone Receptor: 2%, POSITIVE, STRONG  HER2 (+), IHC 3+    06/26/2015 Surgery    Biopsy of metastatic tissue and stabilization with Affixus trochanteric nail, proximal and distal interlock of left femur by Dr. [name]     06/28/2015 Imaging    CT Chest w/ Contrast 1. Extensive right internal mammary chain lymphadenopathy  consistent with metastatic breast cancer. 2. Lytic metastases involving the posterior elements at T1 with probable nondisplaced pathologic fracture of the spinous process. 3. No other evidence of thoracic metastatic disease. 4. Trace bilateral pleural effusions with associated bibasilar atelectasis. 5. Indeterminate right thyroid nodule, not previously imaged. This could be evaluated with thyroid ultrasound as clinically warranted.    07/01/2015 Imaging    Bone Scan 1. Increased activity noted throughout the left femur. Although metastatic disease cannot be excluded, these changes are most likely secondary to prior surgery. 2. No other focal abnormalities identified to suggest metastatic disease.    07/12/2015 - 07/26/2015 Radiation Therapy    Left femur, 30 Gy in 10 fractions by Dr. Sondra Come     07/14/2015 Initial Diagnosis    [name], right ([place])    07/19/2015 Imaging    Initial PET Scan 1. Severe multifocal osseous metastatic disease, much greater than anticipated based on prior imaging. 2. Multifocal nodal metastases involving the right internal mammary, prevascular and subpectoral lymph nodes. No axillary pulmonary involvement identified. 3. No distant extra osseous metastases.    07/30/2015 - 11/29/2015 Chemotherapy    Docetaxel, Herceptin and Perjeta every 3 weeks X6 cycles, pt tolerated moderately well, restaging scan showed excellent response     09/18/2015 Imaging    CT Head w/wo Contrast 1. No acute intracranial abnormality or significant interval change. 2. Stable minimal periventricular white matter hypoattenuation on the right. This may reflect a remote ischemic injury. 3. No evidence for metastatic disease to the brain. 4. No focal soft tissue lesion to explain the patient's tenderness.  10/30/2015 Imaging    CT Abdomen Pelvis w/ Contrast 1. Few mildly prominent fluid-filled loops of small bowel scattered throughout the abdomen, with  intraluminal fluid density within the distal colon. Given the provided history, findings are suggestive of acute enteritis/diarrheal illness. 2. No other acute intra-abdominal or pelvic process identified. 3. Widespread osseous metastatic disease, better evaluated on most recent PET-CT from 07/19/2015. No pathologic fracture or other complication. 4. 11 mm hypodensity within the left kidney. This lesion measures intermediate density, and is indeterminate. While this lesion is similar in size relative to recent studies, this is increased in size relative to prior study from 2012. Further evaluation with dedicated renal mass protocol CT and/or MRI is recommended for complete characterization.    12/2015 - 05/14/2016 Chemotherapy    Maintenance Herceptin and pejeta every 3 weeks stopped on 05/14/16 due to disease progression    12/25/2015 Imaging    Restaging PET Scan 1. Markedly improved skeletal activity, with only several faint foci of residual accentuated metabolic activity, but resolution of the vast majority of the previously extensive osseous metastatic disease. 2. Resolution of the prior right internal mammary and right prevascular lymph nodes. 3. Residual subcutaneous edema and edema along fascia planes in the left upper thigh. This remains somewhat more than I would expect for placement of an IM nail 6 weeks ago. If there is leg swelling further down, consider Doppler venous ultrasound to rule out left lower extremity DVT. I do not perceive an obvious difference in density between the pelvic and common femoral veins on the noncontrast CT data. 4. Chronic right maxillary and right sphenoid sinusitis.    01/16/2016 - 10/20/2017 Anti-estrogen oral therapy    Letrozole 2.5 mg daily. She was switched to exemestane due to disease progression on 02/08/17. Stopped on 10/20/2017 when treatment changed to chemo     03/01/2016 Miscellaneous    Patient presented to ED following a fall; she reports she landed on  her back and is now experiencing back pain. The patient was evaluated and discharge home same day with pain control.    05/01/2016 Imaging    CT CAP IMPRESSION: 1. Stable exam.  No new or progressive disease identified. 2. No evidence for residual or recurrent adenopathy within the chest. 3. Stable bone metastasis.    05/01/2016 Imaging    BONE SCAN IMPRESSION: 1. Small focal uptake involving the anterior rib ends of the left second and right fourth ribs, new since the prior bone scan. The location of this uptake is more suggestive of a traumatic or inflammatory etiology as opposed to metastatic disease. No other evidence of metastatic disease. 2. There are other areas of stable uptake which are most likely degenerative/reactive, including right shoulder and cervical spine uptake and uptake along the right femur adjacent to the ORIF hardware.    05/20/2016 Imaging    NM PET Skull to Thigh IMPRESSION: 1. There multiple metastatic foci in the bony pelvis, including some new abnormal foci compare to the prior PET-CT, compatible with mildly progressive bony metastatic disease. Also, a left T11 vertebral hypermetabolic metastatic lesion is observed, new compared to the prior exam. 2. No active extraosseous malignancy is currently identified. 3. Right anterior fourth rib healing fracture, appears be    06/04/2016 - 01/08/2017 Chemotherapy    Second line chemotherapy Kadcyla every 3 weeks started on 06/04/16 and stopped on 01/08/17 due to mixed response.      10/05/2016 Imaging    CT CAP and Bone Scan:  Bones: left  intramedullary rod and left femoral head neck screw in place. In the proximal diaphysis of the left femur there is cortical thickening and irregularity. No lytic or blastic bone lesions. No fracture or vertebral endplate destruction.   No definite evidence of recurrent disease in the chest, abdomen, or pelvis.    10/28/2016 Imaging    CT A/P IMPRESSION: No acute process  demonstrated in the abdomen or pelvis. No evidence of bowel obstruction or inflammation.    11/04/2016 Imaging    DG foot complete left: IMPRESSION: No fracture or dislocation.  No soft tissue abnormality    11/23/2016 Imaging    CT RIGHT FEMUR IMPRESSION: Negative CT scan of the right thigh.  No visible metastatic disease.  CT LEFT FEMUR IMPRESSION: 1. Postsurgical changes related to prior cephalomedullary rod fixation of the left femur with linear lucency along the anterolateral proximal femoral cortex at level of the lesser trochanter, suspicious for nondisplaced fracture. 2. Slightly permeative appearance of the proximal left femoral cortex at the site of known prior osseous metastases is largely unchanged. 3. Unchanged patchy lucency and sclerosis along the anterior acetabulum, which appears to correspond with an area of faint uptake on the prior PET-CT, suspicious for osseous metastasis. These results will be called to the ordering clinician or representative by the Radiologist Assistant, and communication documented in the PACS or zVision Dashboard.      01/27/2017 Imaging    IMPRESSION: 1. Mixed response to osseous metastasis. Some hypermetabolic lesions are new and increasingly hypermetabolic, while another index lesion is decreasingly hypermetabolic. 2. No evidence of hypermetabolic soft tissue metastasis.     02/08/2017 - 10/19/2017 Antibody Plan    -Herceptin every 3 week since 02/08/17 -Oral Lanpantinib 1535m and decreased to 5027mdue to diarrhea and depression. Due to disease progression she swithced to Verzenio 15073mn 05/04/17. She did not toelrate well so she was switched to Ibrance 100m39m 05/31/17. Due to her neutropenia, Ibrance dose reduced to 75 mg daily, 3 weeks on, one-week off, stopped on 10/20/2017 due to disease progression.     04/30/2017 PET scan    IMPRESSION: 1. Primarily increase in hypermetabolism of osseous metastasis. An isolated right  acetabular lesion is decreased in hypermetabolism since the prior exam. 2. No evidence of hypermetabolic soft tissue metastasis. 3. Similar non FDG avid right-sided thyroid nodule, favoring a benign etiology. Recommend attention on follow-up.    08/30/2017 PET scan    08/30/2017 PET Scan  IMPRESSION: 1. Worsening osseous metastatic disease. 2. Focal hypermetabolism in the central spinal canal the L1 level, similar to the prior exam. Metastatic implant cannot be excluded. 3. Slight enlargement of a right thyroid nodule which now appears more cystic in character. Consider further evaluation with thyroid ultrasound. If patient is clinically hyperthyroid, consider nuclear medicine thyroid uptake and scan    08/30/2017 Progression    08/30/2017 PET Scan  IMPRESSION: 1. Worsening osseous metastatic disease. 2. Focal hypermetabolism in the central spinal canal the L1 level, similar to the prior exam. Metastatic implant cannot be excluded. 3. Slight enlargement of a right thyroid nodule which now appears more cystic in character. Consider further evaluation with thyroid ultrasound. If patient is clinically hyperthyroid, consider nuclear medicine thyroid uptake and scan    09/29/2017 - 10/11/2017 Radiation Therapy    Pt received 27 Gy in 9 fractions directed at the areas of significant uptake noted on recent bone scan the right pelvis and right proximal femur, with Dr, KinaSondra Come 10/19/2017 -  Chemotherapy    -Herceptin every 3 weeks starting 02/08/17, perjeta added on 10/20/2017  -weekly Taxol started on 10/21/2017. Will reduce Taxol to 2 weeks on, 1 week off starting on 02/04/17.     10/27/2017 Imaging    10/27/2017 CT AP IMPRESSION: 1. No acute abnormality. No evidence of bowel obstruction or acute bowel inflammation. Normal appendix. 2. Stable patchy sclerotic osseous metastases throughout the visualized skeleton. No new or progressive metastatic disease in the abdomen or pelvis.     01/11/2018 Imaging    Whole Body Bone Scan 01/11/18  IMPRESSION: 1. Multifocal osseous metastases as described. 2. Right scapular metastasis. 3. Right superior thoracic spinous process. 4. Right sacral ala and right L5 metastases. 5. Right acetabular metastasis. 6. Right proximal femur metastasis. 7. Diffuse metastases throughout the left femur. 8. Anterior right fourth rib metastasis.    01/11/2018 Imaging    CT CAP W Contrast 01/11/18  IMPRESSION: 1. Similar-appearing osseous metastatic disease. 2. No evidence for progressed metastatic disease in the chest, abdomen or pelvis.    03/30/2018 Imaging    Bone Scan 03/30/18  IMPRESSION: Stable multifocal bony metastatic disease.    03/30/2018 Imaging     CT CAP W Contrast 03/30/18 IMPRESSION: 1. New mild contiguous wall thickening with associated mucosal hyperenhancement throughout the left colon and rectum, suggesting nonspecific infectious or inflammatory proctocolitis. Differential includes C diff colitis. 2. Stable faint patchy sclerosis in the iliac bones and lumbar vertebral bodies. No new or progressive osseous metastatic disease by CT. Please note that the osseous lesions are relatively occult by CT and would be better evaluated by PET-CT, as clinically warranted. 3. No evidence of extra osseous metastatic disease.  These results will be called to the ordering clinician or representative by the Radiologist Assistant, and communication documented in the PACS or zVision Dashboard.      CURRENT THERAPY:  Weekly Taxol, Herceptin and Perjeta every 3 weeksstarted on 10/19/2017. Changed Taxol to 2 weeks on, 1 week off starting on 02/04/18  INTERVAL HISTORY:  [name] is here for a follow up of treatment with her husband and her interpretor. She notes she has been staying home due to still having diarrhea vomiting. She is hungry with appetite but when she drinks or eating it does not stay down. She notes she has  been fatigued and has body aches. She denies fever. She notes she has drink about 8 ounces before diarrhea/vomiting. In a day she can have 5-10 episodes of diarrhea. She felt she ha d more diarrhea with lomotil so she stopped it.  She notes her husband has no symptoms but now her daughter and Grandkids have GI symptoms. She traveled to [place] in early February and once she came back about 1 week later she started having GI symptoms.  She wants to know if she can take iron supplements.   REVIEW OF SYSTEMS:   Constitutional: Denies fevers, chills or abnormal weight loss Eyes: Denies blurriness of vision Ears, nose, mouth, throat, and face: Denies mucositis or sore throat Respiratory: Denies cough, dyspnea or wheezes Cardiovascular: Denies palpitation, chest discomfort or lower extremity swelling Gastrointestinal: (+) Severe diarrhea and vomiting  Skin: Denies abnormal skin rashes MSK: (+) General body aches  Lymphatics: Denies new lymphadenopathy or easy bruising Neurological:Denies numbness, tingling or new weaknesses Behavioral/Psych: Mood is stable, no new changes  All other systems were reviewed with the patient and are negative.  MEDICAL HISTORY:  Past Medical History:  Diagnosis Date  . Abnormal Pap smear   .  Anxiety   . Breast cancer ([facility])   . Cancer ([facility] [facility]) 1999   breast-s/p mastectomy, chemo, rad  . CIN I (cervical intraepithelial neoplasia I) 2003   by colpo  . Congenital deafness   . Deaf   . Depression   . Port-A-Cath in place 2017   for chemotherapy  . Radiation 07/12/15-07/26/15   left femur 30 Gy  . S/P bilateral oophorectomy     SURGICAL HISTORY: Past Surgical History:  Procedure Laterality Date  . ABDOMINAL HYSTERECTOMY    . INTRAMEDULLARY (IM) NAIL INTERTROCHANTERIC Left 06/26/2015   Procedure: LEFT BIOMET LONG AFFIXS NAIL;  Surgeon: [name], MD;  Location: [facility];  Service: Orthopedics;  Laterality: Left;  . IR GENERIC HISTORICAL  08/06/2015   IR US GUIDE  VASC ACCESS LEFT 08/06/2015 Aletta Edouard, MD [facility]  . IR GENERIC HISTORICAL  08/06/2015   IR FLUORO GUIDE CV LINE LEFT 08/06/2015 Aletta Edouard, MD [facility]  . IR GENERIC HISTORICAL  03/05/2016   IR CV LINE INJECTION 03/05/2016 Markus Daft, MD [facility]  . LEFT HEART CATH AND CORONARY ANGIOGRAPHY N/A 12/13/2017   Procedure: LEFT HEART CATH AND CORONARY ANGIOGRAPHY;  Surgeon: Leonie Man, MD;  Location: [facility];  Service: Cardiovascular;  Laterality: N/A;  . MASTECTOMY     right breast  . MASTECTOMY    . OVARIAN CYST REMOVAL    . RADIOLOGY WITH ANESTHESIA Left 06/25/2015   Procedure: MRI OF LEFT HIP WITH OR WITHOUT CONTRAST;  Surgeon: Medication Radiologist, MD;  Location: [facility];  Service: Radiology;  Laterality: Left;  DR. MCINTYRE/MRI  . TUBAL LIGATION      I have reviewed the social history and family history with the patient and they are unchanged from previous note.  ALLERGIES:  is allergic to chlorhexidine; promethazine hcl; and diphenhydramine hcl.  MEDICATIONS:  Current Outpatient Medications  Medication Sig Dispense Refill  . albuterol (PROVENTIL HFA;VENTOLIN HFA) 108 (90 Base) MCG/ACT inhaler Inhale 2 puffs into the lungs every 6 (six) hours as needed for wheezing or shortness of breath. 1 Inhaler 0  . ALPRAZolam (XANAX) 0.25 MG tablet Take 1 tablet (0.25 mg total) by mouth 2 (two) times daily as needed for anxiety. 30 tablet 0  . cyclobenzaprine (FLEXERIL) 5 MG tablet Take 1 tablet (5 mg total) by mouth 3 (three) times daily as needed for muscle spasms. 30 tablet 0  . diphenoxylate-atropine (LOMOTIL) 2.5-0.025 MG tablet Take 1-2 tablets by mouth 4 (four) times daily as needed for diarrhea or loose stools. 30 tablet 1  . HYDROcodone-acetaminophen (NORCO) 10-325 MG tablet Take 1 tablet by mouth every 6 (six) hours as needed. 45 tablet 0  . levofloxacin (LEVAQUIN) 750 MG tablet Take 1 tablet (750 mg total) by mouth daily. 7 tablet 0  .  lidocaine-prilocaine (EMLA) cream Apply 1 application topically as needed. Apply to Porta-Cath 1-2 hours prior to access as directed. 30 g 2  . metroNIDAZOLE (FLAGYL) 500 MG tablet Take 1 tablet (500 mg total) by mouth 3 (three) times daily. 30 tablet 0  . omeprazole (PRILOSEC) 20 MG capsule Take 1 capsule (20 mg total) by mouth daily. 30 capsule 0  . oxyCODONE (OXY IR/ROXICODONE) 5 MG immediate release tablet Take 1 tablet (5 mg total) by mouth every 6 (six) hours as needed for severe pain. 30 tablet 0  . potassium chloride (K-DUR) 10 MEQ tablet Take 2 tablets (20 mEq total) by mouth 2 (two) times daily. 180 tablet 3  .  prochlorperazine (COMPAZINE) 10 MG tablet Take 1 tablet (10 mg total) by mouth every 6 (six) hours as needed for nausea or vomiting. 60 tablet 2  . zolpidem (AMBIEN) 5 MG tablet Take 1 tablet (5 mg total) by mouth at bedtime as needed for sleep. 30 tablet 0   Current Facility-Administered Medications  Medication Dose Route Frequency Provider Last Rate Last Dose  . ondansetron (ZOFRAN) injection 8 mg  8 mg Intravenous Once Truitt Merle, MD       Facility-Administered Medications Ordered in Other Visits  Medication Dose Route Frequency Provider Last Rate Last Dose  . 0.9 %  sodium chloride infusion   Intravenous Once Truitt Merle, MD 500 mL/hr at 04/01/18 [id number]    . sodium chloride 0.9 % 1,000 mL with potassium chloride 10 mEq infusion   Intravenous Continuous Gordy Levan, MD   Stopped at 09/10/15 1653  . sodium chloride 0.9 % injection 10 mL  10 mL Intravenous PRN Livesay, Lennis P, MD        PHYSICAL EXAMINATION: ECOG PERFORMANCE STATUS: 2 - Symptomatic, <50% confined to bed  Vitals:   04/01/18 0807  BP: 114/76  Pulse: 91  Resp: 18  Temp: 98.1 F (36.7 C)  SpO2: 100%   Filed Weights   04/01/18 0807  Weight: 127 lb 14.4 oz (58 kg)    GENERAL:alert, no distress and comfortable SKIN: skin color, texture, turgor are normal, no rashes or significant lesions EYES:  normal, Conjunctiva are pink and non-injected, sclera clear OROPHARYNX:no exudate, no erythema and lips, buccal mucosa, and tongue normal  NECK: supple, thyroid normal size, non-tender, without nodularity LYMPH:  no palpable lymphadenopathy in the cervical, axillary or inguinal LUNGS: clear to auscultation and percussion with normal breathing effort HEART: regular rate & rhythm and no murmurs and no lower extremity edema ABDOMEN:abdomen soft, non-tender and normal bowel sounds Musculoskeletal:no cyanosis of digits and no clubbing  NEURO: alert & oriented x 3 with fluent speech, no focal motor/sensory deficits  LABORATORY DATA:  I have reviewed the data as listed CBC Latest Ref Rng & Units 04/01/2018 03/25/2018 03/18/2018  WBC 4.0 - 10.5 K/uL 3.9(L) 4.1 5.5  Hemoglobin 12.0 - 15.0 g/dL 10.7(L) 10.3(L) 10.6(L)  Hematocrit 36.0 - 46.0 % 32.3(L) 31.9(L) 32.6(L)  Platelets 150 - 400 K/uL 238 222 209     CMP Latest Ref Rng & Units 03/25/2018 03/18/2018 02/25/2018  Glucose 70 - 99 mg/dL 84 89 91  BUN 6 - 20 mg/dL 8 8 12   Creatinine 0.44 - 1.00 mg/dL 0.79 0.76 0.78  Sodium 135 - 145 mmol/L 139 139 140  Potassium 3.5 - 5.1 mmol/L 3.0(LL) 2.7(LL) 3.2(L)  Chloride 98 - 111 mmol/L 107 108 106  CO2 22 - 32 mmol/L 22 22 25   Calcium 8.9 - 10.3 mg/dL 8.7(L) 8.7(L) 9.0  Total Protein 6.5 - 8.1 g/dL 6.3(L) 6.7 6.3(L)  Total Bilirubin 0.3 - 1.2 mg/dL 1.9(H) 1.3(H) 1.5(H)  Alkaline Phos 38 - 126 U/L 71 70 69  AST 15 - 41 U/L 12(L) 16 12(L)  ALT 0 - 44 U/L 14 15 10       RADIOGRAPHIC STUDIES: I have personally reviewed the radiological images as listed and agreed with the findings in the report. Nm Bone Scan Whole Body  Result Date: 03/30/2018 CLINICAL DATA:  Stage IV metastatic breast cancer. Assess treatment response. EXAM: NUCLEAR MEDICINE WHOLE BODY BONE SCAN TECHNIQUE: Whole body anterior and posterior images were obtained approximately 3 hours after intravenous injection of radiopharmaceutical.  RADIOPHARMACEUTICALS:  20.3 mCi Technetium-40mMDP IV COMPARISON:  Radionuclide bone scan dated 01/11/2018 and chest, abdomen and pelvis CT dated 03/30/2018. FINDINGS: There has been no significant change in patchy increased tracer uptake in both proximal femurs, distal left femur, right acetabulum and sternum. Multifocal increased tracer activity in the proximal sacrum is unchanged. Focally increased tracer uptake in the anterior right 4th rib, T1 spinous process and proximal left tibia laterally are unchanged. No new areas of increased tracer uptake are seen. Normal renal and bladder activity. IMPRESSION: Stable multifocal bony metastatic disease. Electronically Signed   By: SClaudie ReveringM.D.   On: 03/30/2018 13:18     ASSESSMENT & PLAN:  Debra GRADILLAis a 53y.o. female with    1. N&V and Diarrhea, weight loss  -started about 3 weeks ago, [place] Has progressed significantly the last 3 weeks.  -she has tried imodium and Lomotil which did not help and she is not using much  -CT scan AP from 03/30/18 shows left colon all thickening suspicious of colitis.  -Her C-Diff test was negative last week.  -I discussed the possible etiology such as bacteria and viruses related GI infection, vs chronic inflammation such as ulcerative colitis. I will run more tests on her stool, she agrees -Will give antibiotics Flagyl TID for 10 days and Levaquin once daily for 7 days (04/01/18), if viral infection will continue to give supportive care.  -I will also prescribe Prilosec and give IV fluids today (04/01/18) -She has not been eating much and has lost weight. I strongly encouraged her to increase nutritional supplement and to reduce fiber and dairy rich foods. I also encouraged her to increase liquids, broths, soups in diet.   2.Metastatic right breast cancer to bone, ER+ HER2+ -Shehadexcellent response to initial docetaxel, Herceptin and Perjeta, progressed on maintenance  Herceptin, Perjeta and letrozole, has had multipleline therapy of Her2 antibodies and endocrine therapy. -She is currently on weekly Taxol, Herceptin and Perjetaq3weeks, tolerating moderately well.  -Given moderate tolerance to treatment and stable disease, I reduced her Taxol to 2 weeks off and 1 week off and continue Herceptin/Perjeta q3weeks starting 01/14/18.she is tolerating better -She has been doing better since chemo break. She has clinically been doing better as Tumor Marker has decreased recentlytonormal. -I previously discussed the option ofnewly approved Her2antibody conjugateEnhertu,which showed excellent response rate and long duration for disease control, after patient progressed on Herceptin and TDM 1.  -I discussed her CT CAP and Bone scan from 03/25/18 which shows stable metastatic disease.  I personally reviewed the scan images.  However given her worsening right hip pain, I recommend PET scan for better imaging of bone mets to further evaluate. She is agreeable.  -Scan also shows inflammation of left colon concerning for infection which is related to her significant diarrhea and vomiting. I will hold chemo for now until infection resolved.    3. Body pain  -She has beenevaluated by cardiologist Dr. TSindy Guadeloupethinks after workup the patient'schest pain is not cardiac related. 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  -Her chest pain is stable and so is her body pain, especially in her left leg. -Ipreviouslyprescribed herGabapentin, but decided not to take due to potential side effect of suicidal thoughts.  -Chest pain resolved  -She continues to have body aches and right hip pain. She only takesOxycodone 516mas needed (every a few days)  4.Congenital deafness  5.Mild anemia, secondary to chemotherapy and malignancy -Stable,  Hg at10.3 (03/25/18) -She would like to start iron supplements. I discussed she should wait until her GI infection is  resolved.   6.Hypokalemia -He has intermittent hyperkalemia, on oral supplementKCL, continue 51mq BID -N&V and Diarrhea has worsened in the past few weeks -K at 3 (03/25/18. Continue oral potassium   7.Goal of care discussion -she understand her goal of care is palliative to prolong her life -she is full code for now  8.Insomnia -She cannot take Benadryl due to allergy reaction. She has tried trazodone with no relief.  -IpreviouslyprescribedAmbien 526m    PLAN: -I prescribed Prilosec 2033maily and Flagyl TID for 10 days and Levaquin once daily for 7 days -Continue to hold chemo  -1L IV fluids today with iv zofran, stool for GI panel   -PET scan in 1-2 weeks     No problem-specific Assessment & Plan notes found for this encounter.   Orders Placed This Encounter  Procedures  . Gastrointestinal Panel by PCR , Stool    Standing Status:   Standing    Number of Occurrences:   1  . NM PET Image Restag (PS) Skull Base To Thigh    Standing Status:   Future    Standing Expiration Date:   04/01/2019    Order Specific Question:   If indicated for the ordered procedure, I authorize the administration of a radiopharmaceutical per Radiology protocol    Answer:   Yes    Order Specific Question:   Is the patient pregnant?    Answer:   No    Order Specific Question:   [facility]?    [name] Order Specific Question:   Radiology Contrast Protocol - do NOT remove file path    Answer:   \\[email or web address]  . Enteric precautions (UV disinfection) C difficile, Norovirus    Standing Status:   Standing    Number of Occurrences:   1   All questions were answered. The patient knows to call the clinic with any problems, questions or concerns. No barriers to learning was detected. I spent 25 minutes counseling the patient face to face. The total time spent in the appointment was 30 minutes and more than 50% was  on counseling and review of test results     YanTruitt MerleD 04/01/2018   I, AmoJoslyn Devonm acting as scribe for YanTruitt MerleD.   I have reviewed the above documentation for accuracy and completeness, and I agree with the above.

## 2018-04-01 ENCOUNTER — Telehealth: Payer: Self-pay | Admitting: Emergency Medicine

## 2018-04-01 ENCOUNTER — Other Ambulatory Visit: Payer: Self-pay | Admitting: Emergency Medicine

## 2018-04-01 ENCOUNTER — Other Ambulatory Visit: Payer: Self-pay | Admitting: *Deleted

## 2018-04-01 ENCOUNTER — Encounter: Payer: Self-pay | Admitting: Hematology

## 2018-04-01 ENCOUNTER — Inpatient Hospital Stay: Payer: Medicaid Other

## 2018-04-01 ENCOUNTER — Inpatient Hospital Stay (HOSPITAL_BASED_OUTPATIENT_CLINIC_OR_DEPARTMENT_OTHER): Payer: Medicaid Other | Admitting: Hematology

## 2018-04-01 ENCOUNTER — Other Ambulatory Visit: Payer: Self-pay

## 2018-04-01 ENCOUNTER — Telehealth: Payer: Self-pay | Admitting: Hematology

## 2018-04-01 VITALS — BP 114/76 | HR 91 | Temp 98.1°F | Resp 18 | Ht 65.0 in | Wt 127.9 lb

## 2018-04-01 DIAGNOSIS — C50911 Malignant neoplasm of unspecified site of right female breast: Secondary | ICD-10-CM

## 2018-04-01 DIAGNOSIS — G4700 Insomnia, unspecified: Secondary | ICD-10-CM

## 2018-04-01 DIAGNOSIS — C7951 Secondary malignant neoplasm of bone: Secondary | ICD-10-CM

## 2018-04-01 DIAGNOSIS — Z95828 Presence of other vascular implants and grafts: Secondary | ICD-10-CM

## 2018-04-01 DIAGNOSIS — Z170 Estrogen receptor positive status [ER+]: Secondary | ICD-10-CM

## 2018-04-01 DIAGNOSIS — D6481 Anemia due to antineoplastic chemotherapy: Secondary | ICD-10-CM | POA: Diagnosis not present

## 2018-04-01 DIAGNOSIS — Z5111 Encounter for antineoplastic chemotherapy: Secondary | ICD-10-CM | POA: Diagnosis not present

## 2018-04-01 DIAGNOSIS — E876 Hypokalemia: Secondary | ICD-10-CM

## 2018-04-01 DIAGNOSIS — R197 Diarrhea, unspecified: Secondary | ICD-10-CM

## 2018-04-01 DIAGNOSIS — Z9221 Personal history of antineoplastic chemotherapy: Secondary | ICD-10-CM

## 2018-04-01 DIAGNOSIS — H905 Unspecified sensorineural hearing loss: Secondary | ICD-10-CM

## 2018-04-01 DIAGNOSIS — Z923 Personal history of irradiation: Secondary | ICD-10-CM

## 2018-04-01 DIAGNOSIS — Z79899 Other long term (current) drug therapy: Secondary | ICD-10-CM

## 2018-04-01 LAB — CBC WITH DIFFERENTIAL (CANCER CENTER ONLY)
Abs Immature Granulocytes: 0.1 10*3/uL — ABNORMAL HIGH (ref 0.00–0.07)
Basophils Absolute: 0 10*3/uL (ref 0.0–0.1)
Basophils Relative: 1 %
EOS PCT: 2 %
Eosinophils Absolute: 0.1 10*3/uL (ref 0.0–0.5)
HCT: 32.3 % — ABNORMAL LOW (ref 36.0–46.0)
Hemoglobin: 10.7 g/dL — ABNORMAL LOW (ref 12.0–15.0)
Immature Granulocytes: 3 %
Lymphocytes Relative: 22 %
Lymphs Abs: 0.9 10*3/uL (ref 0.7–4.0)
MCH: 29 pg (ref 26.0–34.0)
MCHC: 33.1 g/dL (ref 30.0–36.0)
MCV: 87.5 fL (ref 80.0–100.0)
MONO ABS: 0.3 10*3/uL (ref 0.1–1.0)
Monocytes Relative: 7 %
Neutro Abs: 2.6 10*3/uL (ref 1.7–7.7)
Neutrophils Relative %: 65 %
Platelet Count: 238 10*3/uL (ref 150–400)
RBC: 3.69 MIL/uL — ABNORMAL LOW (ref 3.87–5.11)
RDW: 15.1 % (ref 11.5–15.5)
WBC Count: 3.9 10*3/uL — ABNORMAL LOW (ref 4.0–10.5)
nRBC: 0 % (ref 0.0–0.2)

## 2018-04-01 LAB — CMP (CANCER CENTER ONLY)
ALT: 12 U/L (ref 0–44)
AST: 20 U/L (ref 15–41)
Albumin: 3.3 g/dL — ABNORMAL LOW (ref 3.5–5.0)
Alkaline Phosphatase: 72 U/L (ref 38–126)
Anion gap: 14 (ref 5–15)
BUN: 7 mg/dL (ref 6–20)
CO2: 20 mmol/L — ABNORMAL LOW (ref 22–32)
CREATININE: 0.8 mg/dL (ref 0.44–1.00)
Calcium: 8.5 mg/dL — ABNORMAL LOW (ref 8.9–10.3)
Chloride: 104 mmol/L (ref 98–111)
GFR, Est AFR Am: >60 mL/min (ref >60)
GFR, Estimated: >60 mL/min (ref >60)
GLUCOSE: 90 mg/dL (ref 70–99)
Potassium: 2.9 mmol/L — CL (ref 3.5–5.1)
Sodium: 138 mmol/L (ref 135–145)
Total Bilirubin: 2 mg/dL — ABNORMAL HIGH (ref 0.3–1.2)
Total Protein: 6.7 g/dL (ref 6.5–8.1)

## 2018-04-01 LAB — C DIFFICILE QUICK SCREEN W PCR REFLEX
C Diff antigen: NEGATIVE
C Diff interpretation: NOT DETECTED
C Diff toxin: NEGATIVE

## 2018-04-01 LAB — MAGNESIUM: Magnesium: 1.7 mg/dL (ref 1.7–2.4)

## 2018-04-01 MED ORDER — METRONIDAZOLE 500 MG PO TABS: 500.0000 mg | ORAL_TABLET | Freq: Three times a day (TID) | ORAL | 0 refills | Status: DC

## 2018-04-01 MED ORDER — POTASSIUM CHLORIDE CRYS ER 20 MEQ PO TBCR
EXTENDED_RELEASE_TABLET | ORAL | Status: AC
  Filled 2018-04-01: qty 2

## 2018-04-01 MED ORDER — ONDANSETRON HCL 4 MG/2ML IJ SOLN
8.0000 mg | Freq: Once | INTRAMUSCULAR | Status: AC
  Administered 2018-04-01: 8 mg via INTRAVENOUS

## 2018-04-01 MED ORDER — POTASSIUM CHLORIDE CRYS ER 20 MEQ PO TBCR: 40.0000 meq | EXTENDED_RELEASE_TABLET | Freq: Once | ORAL | Status: DC

## 2018-04-01 MED ORDER — HEPARIN SOD (PORK) LOCK FLUSH 100 UNIT/ML IV SOLN
500.0000 [IU] | Freq: Once | INTRAVENOUS | Status: AC | PRN
  Administered 2018-04-01: 500 [IU] via INTRAVENOUS
  Filled 2018-04-01: qty 5

## 2018-04-01 MED ORDER — ONDANSETRON HCL 4 MG/2ML IJ SOLN
INTRAMUSCULAR | Status: AC
  Filled 2018-04-01: qty 4

## 2018-04-01 MED ORDER — LEVOFLOXACIN 750 MG PO TABS: 750.0000 mg | ORAL_TABLET | Freq: Every day | ORAL | 0 refills | Status: DC

## 2018-04-01 MED ORDER — SODIUM CHLORIDE 0.9 % IV SOLN
Freq: Once | INTRAVENOUS | Status: AC
  Administered 2018-04-01: 09:00:00 via INTRAVENOUS
  Filled 2018-04-01: qty 250

## 2018-04-01 MED ORDER — POTASSIUM CHLORIDE ER 10 MEQ PO TBCR: 20.0000 meq | EXTENDED_RELEASE_TABLET | Freq: Two times a day (BID) | ORAL | 3 refills | Status: DC

## 2018-04-01 MED ORDER — OMEPRAZOLE 20 MG PO CPDR: 20.0000 mg | DELAYED_RELEASE_CAPSULE | Freq: Every day | ORAL | 0 refills | Status: DC

## 2018-04-01 MED ORDER — SODIUM CHLORIDE 0.9% FLUSH
10.0000 mL | INTRAVENOUS | Status: DC | PRN
  Administered 2018-04-01: 10 mL via INTRAVENOUS
  Filled 2018-04-01: qty 10

## 2018-04-01 NOTE — Addendum Note (Signed)
Addended by: Truitt Merle on: 04/01/2018 09:47 AM   Modules accepted: Orders

## 2018-04-01 NOTE — Patient Instructions (Signed)

## 2018-04-01 NOTE — Telephone Encounter (Signed)
No los per 3/20.

## 2018-04-01 NOTE — Progress Notes (Signed)
Pt placed on Enteric precautions d/t repeated diarrhea per VO from MD Burr Medico.  Spoke with MD Burr Medico about pt's potassium level of 2.9.  Since pt reports that she cannot stay for extended period of time required for IV potassium MD Burr Medico gave VO for pt to receive 40 meq oral potassium in infusion today.  VO from MD [facility] for pt to increase home dose of potassium from two 72meq tablets twice daily orally to three times daily.  Pt's potassium prescriptions refilled and oral abx prescriptions sent, pt instructed to start those.  Stool sample given to lab, GI panel and C Diff tests ordered d/t odor & chronic character of diarrhea.  Per MD Burr Medico pt will not receive any more anti-emetics or any anti-diarrheal medications during this visit, and her fluid rate can be increased in order for pt to be d/c around 11 am (pt reports spouse has to go to work).  Pt advised to avoid public restrooms as much as possible until we contact her with the stool sample results and to wash her and spouse's and others in contact with her (including interpreters) hands frequently with soap and water, no just alcohol gel.  Pt VU of all instructions and d/c information with help from interpreter.

## 2018-04-01 NOTE — Telephone Encounter (Signed)
Charting error.

## 2018-04-02 LAB — GASTROINTESTINAL PANEL BY PCR, STOOL (REPLACES STOOL CULTURE)
ADENOVIRUS F40/41: NOT DETECTED
Astrovirus: NOT DETECTED
Campylobacter species: NOT DETECTED
Cryptosporidium: NOT DETECTED
Cyclospora cayetanensis: NOT DETECTED
Entamoeba histolytica: NOT DETECTED
Enteroaggregative E coli (EAEC): NOT DETECTED
Enteropathogenic E coli (EPEC): NOT DETECTED
Enterotoxigenic E coli (ETEC): NOT DETECTED
Giardia lamblia: NOT DETECTED
Norovirus GI/GII: NOT DETECTED
Plesimonas shigelloides: NOT DETECTED
Rotavirus A: NOT DETECTED
Salmonella species: NOT DETECTED
Sapovirus (I, II, IV, and V): NOT DETECTED
Shiga like toxin producing E coli (STEC): NOT DETECTED
Shigella/Enteroinvasive E coli (EIEC): NOT DETECTED
VIBRIO SPECIES: NOT DETECTED
Vibrio cholerae: NOT DETECTED
Yersinia enterocolitica: NOT DETECTED

## 2018-04-04 ENCOUNTER — Telehealth: Payer: Self-pay

## 2018-04-04 NOTE — Telephone Encounter (Signed)
-----   Message from Truitt Merle, MD sent at 04/04/2018  1:35 PM EDT ----- Please let pt know her GI panel and repeated c-diff from last Friday were all negative, let me know if her diarrhea has improved. If not, OK to stop all antibiotics. Thanks   Truitt Merle  04/04/2018

## 2018-04-04 NOTE — Telephone Encounter (Signed)
Left message for patient that GI panel and C-diff was negative from last week.  Need to know if her diarrhea has improved or not.  If not stop all antibiotics.  Requested she call me back and let me know.

## 2018-04-06 ENCOUNTER — Encounter: Payer: Self-pay | Admitting: Pharmacist

## 2018-04-06 NOTE — Progress Notes (Signed)
[name]   [phone] [phone]   Clinic Follow up Note   Patient Care Team: [name], [name], [name] as PCP - General (Physician Assistant) [name], MD as Consulting Physician (Orthopedic Surgery)  Date of Service:  04/08/2018  CHIEF COMPLAINT: Diarrhea and vomiting and metastatic breast cancer  SUMMARY OF ONCOLOGIC HISTORY: Oncology History    Cancer Staging No matching staging information was found for the patient.       [name], right ([place])   11/1997 Cancer Diagnosis    Patient had 1.4 cm poorly differentiated right breast carcinoma diagnosed in Nov 1999 at age 2, 1/7 axillary nodes involved, ER/PR and HER 2 positive. She had mastectomy with the 7 axillary node evaluation, 4 cycles of adriamycin/ cytoxan followed by taxotere, then five years of tamoxifen thru June 2005 (no herceptin in 1999). She had bilateral oophorectomy in May 2005. She was briefly on aromatase inhibitor after tamoxifen, but discontinued this herself due to poor tolerance.    04/04/2015 Genetic Testing    Negative genetic testing on the breast/ovarian cancer pnael.  The Breast/Ovarian gene panel offered by [facility] includes sequencing and rearrangement analysis for the following 20 genes:  ATM, BARD1, BRCA1, BRCA2, BRIP1, CDH1, CHEK2, EPCAM, FANCC, MLH1, MSH2, MSH6, NBN, PALB2, PMS2, PTEN, RAD51C, RAD51D, TP53, and XRCC2.    06/26/2015 Pathology Results    Initial Path Report Bone, curettage, Left femur met, intramedullary subtroch tissue - METASTATIC ADENOCARCINOMA.  Estrogen Receptor: 95%, POSITIVE, STRONG STAINING INTENSITY Progesterone Receptor: 2%, POSITIVE, STRONG  HER2 (+), IHC 3+    06/26/2015 Surgery    Biopsy of metastatic tissue and stabilization with Affixus trochanteric nail, proximal and distal interlock of left femur by Dr. [name]     06/28/2015 Imaging    CT Chest w/ Contrast 1. Extensive right internal mammary  chain lymphadenopathy consistent with metastatic breast cancer. 2. Lytic metastases involving the posterior elements at T1 with probable nondisplaced pathologic fracture of the spinous process. 3. No other evidence of thoracic metastatic disease. 4. Trace bilateral pleural effusions with associated bibasilar atelectasis. 5. Indeterminate right thyroid nodule, not previously imaged. This could be evaluated with thyroid ultrasound as clinically warranted.    07/01/2015 Imaging    Bone Scan 1. Increased activity noted throughout the left femur. Although metastatic disease cannot be excluded, these changes are most likely secondary to prior surgery. 2. No other focal abnormalities identified to suggest metastatic disease.    07/12/2015 - 07/26/2015 Radiation Therapy    Left femur, 30 Gy in 10 fractions by Dr. Sondra Come     07/14/2015 Initial Diagnosis    [name], right ([place])    07/19/2015 Imaging    Initial PET Scan 1. Severe multifocal osseous metastatic disease, much greater than anticipated based on prior imaging. 2. Multifocal nodal metastases involving the right internal mammary, prevascular and subpectoral lymph nodes. No axillary pulmonary involvement identified. 3. No distant extra osseous metastases.    07/30/2015 - 11/29/2015 Chemotherapy    Docetaxel, Herceptin and Perjeta every 3 weeks X6 cycles, pt tolerated moderately well, restaging scan showed excellent response     09/18/2015 Imaging    CT Head w/wo Contrast 1. No acute intracranial abnormality or significant interval change. 2. Stable minimal periventricular white matter hypoattenuation on the right. This may reflect a remote ischemic injury. 3. No evidence for metastatic disease to the brain. 4. No focal soft tissue lesion to explain the patient's tenderness.  10/30/2015 Imaging    CT Abdomen Pelvis w/ Contrast 1. Few mildly prominent fluid-filled loops of small bowel scattered throughout the  abdomen, with intraluminal fluid density within the distal colon. Given the provided history, findings are suggestive of acute enteritis/diarrheal illness. 2. No other acute intra-abdominal or pelvic process identified. 3. Widespread osseous metastatic disease, better evaluated on most recent PET-CT from 07/19/2015. No pathologic fracture or other complication. 4. 11 mm hypodensity within the left kidney. This lesion measures intermediate density, and is indeterminate. While this lesion is similar in size relative to recent studies, this is increased in size relative to prior study from 2012. Further evaluation with dedicated renal mass protocol CT and/or MRI is recommended for complete characterization.    12/2015 - 05/14/2016 Chemotherapy    Maintenance Herceptin and pejeta every 3 weeks stopped on 05/14/16 due to disease progression    12/25/2015 Imaging    Restaging PET Scan 1. Markedly improved skeletal activity, with only several faint foci of residual accentuated metabolic activity, but resolution of the vast majority of the previously extensive osseous metastatic disease. 2. Resolution of the prior right internal mammary and right prevascular lymph nodes. 3. Residual subcutaneous edema and edema along fascia planes in the left upper thigh. This remains somewhat more than I would expect for placement of an IM nail 6 weeks ago. If there is leg swelling further down, consider Doppler venous ultrasound to rule out left lower extremity DVT. I do not perceive an obvious difference in density between the pelvic and common femoral veins on the noncontrast CT data. 4. Chronic right maxillary and right sphenoid sinusitis.    01/16/2016 - 10/20/2017 Anti-estrogen oral therapy    Letrozole 2.5 mg daily. She was switched to exemestane due to disease progression on 02/08/17. Stopped on 10/20/2017 when treatment changed to chemo     03/01/2016 Miscellaneous    Patient presented to ED following a fall; she reports  she landed on her back and is now experiencing back pain. The patient was evaluated and discharge home same day with pain control.    05/01/2016 Imaging    CT CAP IMPRESSION: 1. Stable exam.  No new or progressive disease identified. 2. No evidence for residual or recurrent adenopathy within the chest. 3. Stable bone metastasis.    05/01/2016 Imaging    BONE SCAN IMPRESSION: 1. Small focal uptake involving the anterior rib ends of the left second and right fourth ribs, new since the prior bone scan. The location of this uptake is more suggestive of a traumatic or inflammatory etiology as opposed to metastatic disease. No other evidence of metastatic disease. 2. There are other areas of stable uptake which are most likely degenerative/reactive, including right shoulder and cervical spine uptake and uptake along the right femur adjacent to the ORIF hardware.    05/20/2016 Imaging    NM PET Skull to Thigh IMPRESSION: 1. There multiple metastatic foci in the bony pelvis, including some new abnormal foci compare to the prior PET-CT, compatible with mildly progressive bony metastatic disease. Also, a left T11 vertebral hypermetabolic metastatic lesion is observed, new compared to the prior exam. 2. No active extraosseous malignancy is currently identified. 3. Right anterior fourth rib healing fracture, appears be    06/04/2016 - 01/08/2017 Chemotherapy    Second line chemotherapy Kadcyla every 3 weeks started on 06/04/16 and stopped on 01/08/17 due to mixed response.      10/05/2016 Imaging    CT CAP and Bone Scan:  Bones: left  intramedullary rod and left femoral head neck screw in place. In the proximal diaphysis of the left femur there is cortical thickening and irregularity. No lytic or blastic bone lesions. No fracture or vertebral endplate destruction.   No definite evidence of recurrent disease in the chest, abdomen, or pelvis.    10/28/2016 Imaging    CT A/P IMPRESSION: No  acute process demonstrated in the abdomen or pelvis. No evidence of bowel obstruction or inflammation.    11/04/2016 Imaging    DG foot complete left: IMPRESSION: No fracture or dislocation.  No soft tissue abnormality    11/23/2016 Imaging    CT RIGHT FEMUR IMPRESSION: Negative CT scan of the right thigh.  No visible metastatic disease.  CT LEFT FEMUR IMPRESSION: 1. Postsurgical changes related to prior cephalomedullary rod fixation of the left femur with linear lucency along the anterolateral proximal femoral cortex at level of the lesser trochanter, suspicious for nondisplaced fracture. 2. Slightly permeative appearance of the proximal left femoral cortex at the site of known prior osseous metastases is largely unchanged. 3. Unchanged patchy lucency and sclerosis along the anterior acetabulum, which appears to correspond with an area of faint uptake on the prior PET-CT, suspicious for osseous metastasis. These results will be called to the ordering clinician or representative by the Radiologist Assistant, and communication documented in the PACS or zVision Dashboard.      01/27/2017 Imaging    IMPRESSION: 1. Mixed response to osseous metastasis. Some hypermetabolic lesions are new and increasingly hypermetabolic, while another index lesion is decreasingly hypermetabolic. 2. No evidence of hypermetabolic soft tissue metastasis.     02/08/2017 - 10/19/2017 Antibody Plan    -Herceptin every 3 week since 02/08/17 -Oral Lanpantinib 1561m and decreased to 5050mdue to diarrhea and depression. Due to disease progression she swithced to Verzenio 15040mn 05/04/17. She did not toelrate well so she was switched to Ibrance 100m69m 05/31/17. Due to her neutropenia, Ibrance dose reduced to 75 mg daily, 3 weeks on, one-week off, stopped on 10/20/2017 due to disease progression.     04/30/2017 PET scan    IMPRESSION: 1. Primarily increase in hypermetabolism of osseous metastasis. An  isolated right acetabular lesion is decreased in hypermetabolism since the prior exam. 2. No evidence of hypermetabolic soft tissue metastasis. 3. Similar non FDG avid right-sided thyroid nodule, favoring a benign etiology. Recommend attention on follow-up.    08/30/2017 PET scan    08/30/2017 PET Scan  IMPRESSION: 1. Worsening osseous metastatic disease. 2. Focal hypermetabolism in the central spinal canal the L1 level, similar to the prior exam. Metastatic implant cannot be excluded. 3. Slight enlargement of a right thyroid nodule which now appears more cystic in character. Consider further evaluation with thyroid ultrasound. If patient is clinically hyperthyroid, consider nuclear medicine thyroid uptake and scan    08/30/2017 Progression    08/30/2017 PET Scan  IMPRESSION: 1. Worsening osseous metastatic disease. 2. Focal hypermetabolism in the central spinal canal the L1 level, similar to the prior exam. Metastatic implant cannot be excluded. 3. Slight enlargement of a right thyroid nodule which now appears more cystic in character. Consider further evaluation with thyroid ultrasound. If patient is clinically hyperthyroid, consider nuclear medicine thyroid uptake and scan    09/29/2017 - 10/11/2017 Radiation Therapy    Pt received 27 Gy in 9 fractions directed at the areas of significant uptake noted on recent bone scan the right pelvis and right proximal femur, with Dr, KinaSondra Come 10/19/2017 -  03/25/2018 Chemotherapy    -Herceptin every 3 weeks starting 02/08/17, perjeta added on 10/20/2017  -weekly Taxol started on 10/21/2017. Will reduce Taxol to 2 weeks on, 1 week off starting on 02/04/17.     10/27/2017 Imaging    10/27/2017 CT AP IMPRESSION: 1. No acute abnormality. No evidence of bowel obstruction or acute bowel inflammation. Normal appendix. 2. Stable patchy sclerotic osseous metastases throughout the visualized skeleton. No new or progressive metastatic disease in the  abdomen or pelvis.    01/11/2018 Imaging    Whole Body Bone Scan 01/11/18  IMPRESSION: 1. Multifocal osseous metastases as described. 2. Right scapular metastasis. 3. Right superior thoracic spinous process. 4. Right sacral ala and right L5 metastases. 5. Right acetabular metastasis. 6. Right proximal femur metastasis. 7. Diffuse metastases throughout the left femur. 8. Anterior right fourth rib metastasis.    01/11/2018 Imaging    CT CAP W Contrast 01/11/18  IMPRESSION: 1. Similar-appearing osseous metastatic disease. 2. No evidence for progressed metastatic disease in the chest, abdomen or pelvis.    03/30/2018 Imaging    Bone Scan 03/30/18  IMPRESSION: Stable multifocal bony metastatic disease.    03/30/2018 Imaging     CT CAP W Contrast 03/30/18 IMPRESSION: 1. New mild contiguous wall thickening with associated mucosal hyperenhancement throughout the left colon and rectum, suggesting nonspecific infectious or inflammatory proctocolitis. Differential includes C diff colitis. 2. Stable faint patchy sclerosis in the iliac bones and lumbar vertebral bodies. No new or progressive osseous metastatic disease by CT. Please note that the osseous lesions are relatively occult by CT and would be better evaluated by PET-CT, as clinically warranted. 3. No evidence of extra osseous metastatic disease.  These results will be called to the ordering clinician or representative by the Radiologist Assistant, and communication documented in the PACS or zVision Dashboard.     Chemotherapy    Enhertu q3weeks starting 04/15/18       CURRENT THERAPY:  Pending Enhertu q3weeks starting 04/15/18  INTERVAL HISTORY:  [name] is here for a follow up. She presents to the clinic today with her interpretor. She notes she is feeling much better. She notes she felt better with IV Fluids and she took all of her antibiotics. She notes her bowel movements are back to normal now. She is  eating better and was able to gain weight. Her energy is back to baseline. She denies fever or cough now. She still has mild left hip and right shoulder aches and uses Bengay for it.  She notes her sleeping is not adequate. She is getting up after 3-4 hours before waking up. She has Ambien 1 tab and would like to increase dose. She is not taking much oxycodone. She is taking Potassium 2 tabs BID.   She notes she wants to travel to [place], near [place] to see her daughter.    REVIEW OF SYSTEMS:   Constitutional: Denies fevers, chills or abnormal weight loss (+) improved appetite and weight gain  Eyes: Denies blurriness of vision Ears, nose, mouth, throat, and face: Denies mucositis or sore throat Respiratory: Denies cough, dyspnea or wheezes Cardiovascular: Denies palpitation, chest discomfort or lower extremity swelling Gastrointestinal:  Denies nausea, heartburn or change in bowel habits Skin: Denies abnormal skin rashes MSK: (+) Left hip and right shoulder aches  Lymphatics: Denies new lymphadenopathy or easy bruising Neurological:Denies numbness, tingling or new weaknesses Behavioral/Psych: Mood is stable, no new changes  All other systems were reviewed with the patient and are negative.  MEDICAL HISTORY:  Past Medical History:  Diagnosis Date  . Abnormal Pap smear   . Anxiety   . Breast cancer ([place])   . Cancer ([place]) 1999   breast-s/p mastectomy, chemo, rad  . CIN I (cervical intraepithelial neoplasia I) 2003   by colpo  . Congenital deafness   . Deaf   . Depression   . Port-A-Cath in place 2017   for chemotherapy  . Radiation 07/12/15-07/26/15   left femur 30 Gy  . S/P bilateral oophorectomy     SURGICAL HISTORY: Past Surgical History:  Procedure Laterality Date  . ABDOMINAL HYSTERECTOMY    . INTRAMEDULLARY (IM) NAIL INTERTROCHANTERIC Left 06/26/2015   Procedure: LEFT BIOMET LONG AFFIXS NAIL;  Surgeon: [name], MD;  Location: [facility];  Service: Orthopedics;   Laterality: Left;  . IR GENERIC HISTORICAL  08/06/2015   IR US GUIDE VASC ACCESS LEFT 08/06/2015 Aletta Edouard, MD [facility]  . IR GENERIC HISTORICAL  08/06/2015   IR FLUORO GUIDE CV LINE LEFT 08/06/2015 Aletta Edouard, MD [facility]  . IR GENERIC HISTORICAL  03/05/2016   IR CV LINE INJECTION 03/05/2016 Markus Daft, MD [facility]  . LEFT HEART CATH AND CORONARY ANGIOGRAPHY N/A 12/13/2017   Procedure: LEFT HEART CATH AND CORONARY ANGIOGRAPHY;  Surgeon: Leonie Man, MD;  Location: [facility];  Service: Cardiovascular;  Laterality: N/A;  . MASTECTOMY     right breast  . MASTECTOMY    . OVARIAN CYST REMOVAL    . RADIOLOGY WITH ANESTHESIA Left 06/25/2015   Procedure: MRI OF LEFT HIP WITH OR WITHOUT CONTRAST;  Surgeon: Medication Radiologist, MD;  Location: [facility];  Service: Radiology;  Laterality: Left;  DR. MCINTYRE/MRI  . TUBAL LIGATION      I have reviewed the social history and family history with the patient and they are unchanged from previous note.  ALLERGIES:  is allergic to chlorhexidine; promethazine hcl; and diphenhydramine hcl.  MEDICATIONS:  Current Outpatient Medications  Medication Sig Dispense Refill  . albuterol (PROVENTIL HFA;VENTOLIN HFA) 108 (90 Base) MCG/ACT inhaler Inhale 2 puffs into the lungs every 6 (six) hours as needed for wheezing or shortness of breath. 1 Inhaler 0  . ALPRAZolam (XANAX) 0.25 MG tablet Take 1 tablet (0.25 mg total) by mouth 2 (two) times daily as needed for anxiety. 30 tablet 0  . cyclobenzaprine (FLEXERIL) 5 MG tablet Take 1 tablet (5 mg total) by mouth 3 (three) times daily as needed for muscle spasms. 30 tablet 0  . diphenoxylate-atropine (LOMOTIL) 2.5-0.025 MG tablet Take 1-2 tablets by mouth 4 (four) times daily as needed for diarrhea or loose stools. 30 tablet 1  . HYDROcodone-acetaminophen (NORCO) 10-325 MG tablet Take 1 tablet by mouth every 6 (six) hours as needed. 45 tablet 0  . levofloxacin (LEVAQUIN) 750 MG tablet Take 1  tablet (750 mg total) by mouth daily. 7 tablet 0  . lidocaine-prilocaine (EMLA) cream Apply 1 application topically as needed. Apply to Porta-Cath 1-2 hours prior to access as directed. 30 g 2  . metroNIDAZOLE (FLAGYL) 500 MG tablet Take 1 tablet (500 mg total) by mouth 3 (three) times daily. 30 tablet 0  . omeprazole (PRILOSEC) 20 MG capsule Take 1 capsule (20 mg total) by mouth daily. 30 capsule 0  . oxyCODONE (OXY IR/ROXICODONE) 5 MG immediate release tablet Take 1 tablet (5 mg total) by mouth every 6 (six) hours as needed for severe pain. 30 tablet 0  . potassium chloride (K-DUR) 10 MEQ tablet  Take 2 tablets (20 mEq total) by mouth 2 (two) times daily. 180 tablet 3  . prochlorperazine (COMPAZINE) 10 MG tablet Take 1 tablet (10 mg total) by mouth every 6 (six) hours as needed for nausea or vomiting. 60 tablet 2  . zolpidem (AMBIEN) 5 MG tablet Take 1 tablet (5 mg total) by mouth at bedtime as needed for sleep. 30 tablet 0   No current facility-administered medications for this visit.    Facility-Administered Medications Ordered in Other Visits  Medication Dose Route Frequency Provider Last Rate Last Dose  . sodium chloride 0.9 % 1,000 mL with potassium chloride 10 mEq infusion   Intravenous Continuous Gordy Levan, MD   Stopped at 09/10/15 1653  . sodium chloride 0.9 % injection 10 mL  10 mL Intravenous PRN Livesay, Lennis P, MD        PHYSICAL EXAMINATION: ECOG PERFORMANCE STATUS: 1 - Symptomatic but completely ambulatory  Vitals:   04/08/18 0830  BP: 114/63  Pulse: 72  Resp: 18  Temp: 97.6 F (36.4 C)  SpO2: 100%   Filed Weights   04/08/18 0830  Weight: 133 lb 3.2 oz (60.4 kg)    GENERAL:alert, no distress and comfortable SKIN: skin color, texture, turgor are normal, no rashes or significant lesions EYES: normal, Conjunctiva are pink and non-injected, sclera clear OROPHARYNX:no exudate, no erythema and lips, buccal mucosa, and tongue normal  NECK: supple, thyroid  normal size, non-tender, without nodularity LYMPH:  no palpable lymphadenopathy in the cervical, axillary or inguinal LUNGS: clear to auscultation and percussion with normal breathing effort HEART: regular rate & rhythm and no murmurs and no lower extremity edema ABDOMEN:abdomen soft, non-tender and normal bowel sounds Musculoskeletal:no cyanosis of digits and no clubbing  NEURO: alert & oriented x 3 with fluent speech, no focal motor/sensory deficits  LABORATORY DATA:  I have reviewed the data as listed CBC Latest Ref Rng & Units 04/08/2018 04/01/2018 03/25/2018  WBC 4.0 - 10.5 K/uL 3.2(L) 3.9(L) 4.1  Hemoglobin 12.0 - 15.0 g/dL 9.7(L) 10.7(L) 10.3(L)  Hematocrit 36.0 - 46.0 % 29.8(L) 32.3(L) 31.9(L)  Platelets 150 - 400 K/uL 160 238 222     CMP Latest Ref Rng & Units 04/08/2018 04/01/2018 03/25/2018  Glucose 70 - 99 mg/dL 78 90 84  BUN 6 - 20 mg/dL 7 7 8   Creatinine 0.44 - 1.00 mg/dL 0.78 0.80 0.79  Sodium 135 - 145 mmol/L 143 138 139  Potassium 3.5 - 5.1 mmol/L 2.8(LL) 2.9(LL) 3.0(LL)  Chloride 98 - 111 mmol/L 108 104 107  CO2 22 - 32 mmol/L 25 20(L) 22  Calcium 8.9 - 10.3 mg/dL 8.3(L) 8.5(L) 8.7(L)  Total Protein 6.5 - 8.1 g/dL 5.9(L) 6.7 6.3(L)  Total Bilirubin 0.3 - 1.2 mg/dL 1.3(H) 2.0(H) 1.9(H)  Alkaline Phos 38 - 126 U/L 59 72 71  AST 15 - 41 U/L 14(L) 20 12(L)  ALT 0 - 44 U/L 14 12 14       RADIOGRAPHIC STUDIES: I have personally reviewed the radiological images as listed and agreed with the findings in the report. No results found.   ASSESSMENT & PLAN:  DANNIELA MCBREARTY is a 53 y.o. 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 -She is currently on weekly Taxol, Herceptin and Perjetaq3weeks, tolerating moderately well.  -Given moderate  tolerance to treatment and stable disease, I  reduced her Taxol to 2 weeks off and 1 week off and continue Herceptin/Perjeta q3weeks starting 01/14/18.she is tolerating better -I discussed her CT CAP and Bone scan from 03/25/18 which shows stable metastatic disease. I personally reviewed the scan images.  -She recently developed a severe diarrhea and nausea, she is clinically doing better this week with resolved nausea and diarrhea and now only mild body aches.  -Ordered her restaging scan showed a stable disease, she has been having on and off chest/hip pain, concerning for mild disease progression, also due to her moderate tolerance to chemotherapy, I plan to switch her treatment to newly approved Her2antibody conjugateEnhertu q3weeks,which has showed excellent response rate and long duration for disease control in clinical trial I reviewed side effects in great detail with her. She is agreeable. Plan to start next week.  -Will monitor her heart function with repeat ECHO every 3-6 months. Will spread out due to COVID-19 -Labs reviewed, CBC and CMP WNL except WBC at 3.2, Hg at 9.7, K 2.8, Ca 8.3, mildly low protein, bili at 1.3. I will give IV potassium today.  -F/u in 2 weeks   2. N&V and Diarrhea, weight loss  -started about 3-4 weeks ago, onset about 1 week after trip to [place].  -She has tried imodium and Lomotil which did not help and she is not using much  -CT scan AP from 03/30/18 shows left colon all thickening suspicious of colitis.  -Her C-Diff test and stool GI panel were negative last week.  -She completed antibiotics Flagyl and Levaquin for a week  -Diarrhea and nausea resolved now, energy and appetite normalized.  -Her weight has increased 6 pounds since last visit.    3. Body pain  -She has beenevaluated by cardiologist Dr. Sindy Guadeloupe thinks after workup the patient'schest pain is not cardiac related. 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  -Her chest pain  is stable and so is her body pain, especially in her left leg. -Ipreviouslyprescribed herGabapentin, but decided not to take due to potential side effect of suicidal thoughts.  -Chest pain resolved  -She now has left hip and right shoulder aches, controlled with Bengay cream. She only takesOxycodone 47m as needed(every a few days)  4.Congenital deafness  5.Mild anemia, secondary to chemotherapy and malignancy -Stable, Hg at9.7 (04/08/18) -She would like to start iron supplements. I discussed she should wait until her GI infection is resolved.   6.Hypokalemia -He has intermittent hyperkalemia, on oral supplementKCL, continue 227m BID -N&V and Diarrhea has worsened in the past few weeks -K at 2.8 (04/08/18) Will give IV Potassium today. Will increase oral potassium to 2 in the AM and PM and 1 pill in the afternoon.  -I encouraged her to increase potassium in diet with bananas, oranges and nuts.   7.Goal of care discussion -she understand her goal of care is palliative to prolong her life -she is full code for now  8.Insomnia -She cannot take Benadryl due to allergy reaction. She has tried trazodone with no relief.  -IpreviouslyprescribedAmbien 42m642m-Her insomnia is not currently controlled with 42mg53mbien. I will increase her to 1.5 tabs at night.     PLAN: -Labs reviewed, K at 2.8. will give IV Potassium 20me59mong with 1L NS  -start Enhertu next week  -Lab, flush and f/u in 2 weeks   No problem-specific Assessment & Plan notes found for this encounter.   No orders of the defined types were placed in this encounter.  All questions were  answered. The patient knows to call the clinic with any problems, questions or concerns. No barriers to learning was detected. I spent 20 minutes counseling the patient face to face. The total time spent in the appointment was 25 minutes and more than 50% was on counseling and review of test results     Truitt Merle, MD  04/08/2018   I, Joslyn Devon, am acting as scribe for Truitt Merle, MD.   I have reviewed the above documentation for accuracy and completeness, and I agree with the above.

## 2018-04-08 ENCOUNTER — Encounter: Payer: Self-pay | Admitting: Hematology

## 2018-04-08 ENCOUNTER — Ambulatory Visit: Payer: Medicaid Other

## 2018-04-08 ENCOUNTER — Other Ambulatory Visit: Payer: Medicaid Other

## 2018-04-08 ENCOUNTER — Ambulatory Visit: Payer: Medicaid Other | Admitting: Hematology

## 2018-04-08 ENCOUNTER — Other Ambulatory Visit: Payer: Self-pay

## 2018-04-08 ENCOUNTER — Telehealth: Payer: Self-pay | Admitting: Hematology

## 2018-04-08 ENCOUNTER — Inpatient Hospital Stay: Payer: Medicaid Other

## 2018-04-08 ENCOUNTER — Inpatient Hospital Stay (HOSPITAL_BASED_OUTPATIENT_CLINIC_OR_DEPARTMENT_OTHER): Payer: Medicaid Other | Admitting: Hematology

## 2018-04-08 VITALS — BP 114/63 | HR 72 | Temp 97.6°F | Resp 18 | Ht 65.0 in | Wt 133.2 lb

## 2018-04-08 DIAGNOSIS — Z79899 Other long term (current) drug therapy: Secondary | ICD-10-CM | POA: Diagnosis not present

## 2018-04-08 DIAGNOSIS — G4700 Insomnia, unspecified: Secondary | ICD-10-CM

## 2018-04-08 DIAGNOSIS — C50919 Malignant neoplasm of unspecified site of unspecified female breast: Secondary | ICD-10-CM

## 2018-04-08 DIAGNOSIS — C7951 Secondary malignant neoplasm of bone: Secondary | ICD-10-CM

## 2018-04-08 DIAGNOSIS — C799 Secondary malignant neoplasm of unspecified site: Principal | ICD-10-CM

## 2018-04-08 DIAGNOSIS — Z95828 Presence of other vascular implants and grafts: Secondary | ICD-10-CM

## 2018-04-08 DIAGNOSIS — C50911 Malignant neoplasm of unspecified site of right female breast: Secondary | ICD-10-CM

## 2018-04-08 DIAGNOSIS — E876 Hypokalemia: Secondary | ICD-10-CM | POA: Diagnosis not present

## 2018-04-08 DIAGNOSIS — D6481 Anemia due to antineoplastic chemotherapy: Secondary | ICD-10-CM

## 2018-04-08 DIAGNOSIS — Z170 Estrogen receptor positive status [ER+]: Secondary | ICD-10-CM | POA: Diagnosis not present

## 2018-04-08 DIAGNOSIS — Z5111 Encounter for antineoplastic chemotherapy: Secondary | ICD-10-CM | POA: Diagnosis not present

## 2018-04-08 DIAGNOSIS — Z923 Personal history of irradiation: Secondary | ICD-10-CM | POA: Diagnosis not present

## 2018-04-08 LAB — CBC WITH DIFFERENTIAL/PLATELET
Abs Immature Granulocytes: 0.04 10*3/uL (ref 0.00–0.07)
Basophils Absolute: 0 10*3/uL (ref 0.0–0.1)
Basophils Relative: 0 %
EOS PCT: 1 %
Eosinophils Absolute: 0 10*3/uL (ref 0.0–0.5)
HCT: 29.8 % — ABNORMAL LOW (ref 36.0–46.0)
Hemoglobin: 9.7 g/dL — ABNORMAL LOW (ref 12.0–15.0)
Immature Granulocytes: 1 %
Lymphocytes Relative: 26 %
Lymphs Abs: 0.8 10*3/uL (ref 0.7–4.0)
MCH: 29.2 pg (ref 26.0–34.0)
MCHC: 32.6 g/dL (ref 30.0–36.0)
MCV: 89.8 fL (ref 80.0–100.0)
Monocytes Absolute: 0.4 10*3/uL (ref 0.1–1.0)
Monocytes Relative: 11 %
Neutro Abs: 1.9 10*3/uL (ref 1.7–7.7)
Neutrophils Relative %: 61 %
Platelets: 160 10*3/uL (ref 150–400)
RBC: 3.32 MIL/uL — ABNORMAL LOW (ref 3.87–5.11)
RDW: 15 % (ref 11.5–15.5)
WBC: 3.2 10*3/uL — ABNORMAL LOW (ref 4.0–10.5)
nRBC: 0 % (ref 0.0–0.2)

## 2018-04-08 LAB — COMPREHENSIVE METABOLIC PANEL
ALT: 14 U/L (ref 0–44)
AST: 14 U/L — ABNORMAL LOW (ref 15–41)
Albumin: 3.1 g/dL — ABNORMAL LOW (ref 3.5–5.0)
Alkaline Phosphatase: 59 U/L (ref 38–126)
Anion gap: 10 (ref 5–15)
BUN: 7 mg/dL (ref 6–20)
CHLORIDE: 108 mmol/L (ref 98–111)
CO2: 25 mmol/L (ref 22–32)
Calcium: 8.3 mg/dL — ABNORMAL LOW (ref 8.9–10.3)
Creatinine, Ser: 0.78 mg/dL (ref 0.44–1.00)
GFR calc Af Amer: >60 mL/min (ref >60)
GFR calc non Af Amer: >60 mL/min (ref >60)
Glucose, Bld: 78 mg/dL (ref 70–99)
Potassium: 2.8 mmol/L — CL (ref 3.5–5.1)
Sodium: 143 mmol/L (ref 135–145)
Total Bilirubin: 1.3 mg/dL — ABNORMAL HIGH (ref 0.3–1.2)
Total Protein: 5.9 g/dL — ABNORMAL LOW (ref 6.5–8.1)

## 2018-04-08 MED ORDER — SODIUM CHLORIDE 0.9% FLUSH
10.0000 mL | Freq: Once | INTRAVENOUS | Status: AC
  Administered 2018-04-08: 10 mL via INTRAVENOUS
  Filled 2018-04-08: qty 10

## 2018-04-08 MED ORDER — SODIUM CHLORIDE 0.9 % IV SOLN
INTRAVENOUS | Status: AC
  Administered 2018-04-08: 09:00:00 via INTRAVENOUS
  Filled 2018-04-08: qty 1000

## 2018-04-08 MED ORDER — HEPARIN SOD (PORK) LOCK FLUSH 100 UNIT/ML IV SOLN
500.0000 [IU] | Freq: Once | INTRAVENOUS | Status: AC
  Administered 2018-04-08: 500 [IU] via INTRAVENOUS
  Filled 2018-04-08: qty 5

## 2018-04-08 MED ORDER — SODIUM CHLORIDE 0.9% FLUSH
10.0000 mL | INTRAVENOUS | Status: DC | PRN
  Administered 2018-04-08: 10 mL via INTRAVENOUS
  Filled 2018-04-08: qty 10

## 2018-04-08 NOTE — Patient Instructions (Addendum)
Coronavirus (COVID-19) Are you at risk?  Are you at risk for the Coronavirus (COVID-19)?  To be considered HIGH RISK for Coronavirus (COVID-19), you have to meet the following criteria:  . Traveled to [place], [place], [place], [place] or [place]; or in the [place] to [place], [place], [place], or [place]; and have fever, cough, and shortness of breath within the last 2 weeks of travel OR . Been in close contact with a person diagnosed with COVID-19 within the last 2 weeks and have fever, cough, and shortness of breath . IF YOU DO NOT MEET THESE CRITERIA, YOU ARE CONSIDERED LOW RISK FOR COVID-19.  What to do if you are HIGH RISK for COVID-19?  . If you are having a medical emergency, call 911. . Seek medical care right away. Before you go to a doctor's office, urgent care or emergency department, call ahead and tell them about your recent travel, contact with someone diagnosed with COVID-19, and your symptoms. You should receive instructions from your physician's office regarding next steps of care.  . When you arrive at healthcare provider, tell the healthcare staff immediately you have returned from visiting [place], [place], [place], [place] or [place]; or traveled in the [place] to [place], [place], [place], or [place]; in the last two weeks or you have been in close contact with a person diagnosed with COVID-19 in the last 2 weeks.   . Tell the health care staff about your symptoms: fever, cough and shortness of breath. . After you have been seen by a medical provider, you will be either: o Tested for (COVID-19) and discharged home on quarantine except to seek medical care if symptoms worsen, and asked to  - Stay home and avoid contact with others until you get your results (4-5 days)  - Avoid travel on public transportation if possible (such as bus, train, or airplane) or o Sent to the Emergency Department by EMS for evaluation, COVID-19 testing, and possible  admission depending on your condition and test results.  What to do if you are LOW RISK for COVID-19?  Reduce your risk of any infection by using the same precautions used for avoiding the common cold or flu:  . Wash your hands often with soap and warm water for at least 20 seconds.  If soap and water are not readily available, use an alcohol-based hand sanitizer with at least 60% alcohol.  . If coughing or sneezing, cover your mouth and nose by coughing or sneezing into the elbow areas of your shirt or coat, into a tissue or into your sleeve (not your hands). . Avoid shaking hands with others and consider head nods or verbal greetings only. . Avoid touching your eyes, nose, or mouth with unwashed hands.  . Avoid close contact with people who are sick. . Avoid places or events with large numbers of people in one location, like concerts or sporting events. . Carefully consider travel plans you have or are making. . If you are planning any travel outside or inside the [place], visit the CDC's Travelers' Health webpage for the latest health notices. . If you have some symptoms but not all symptoms, continue to monitor at home and seek medical attention if your symptoms worsen. . If you are having a medical emergency, call 911.   ADDITIONAL HEALTHCARE OPTIONS FOR PATIENTS  [facility] / e-Visit: [email or web address]         [facility]: [phone]  [facility]: [phone]                   [facility]: [phone]   

## 2018-04-08 NOTE — Progress Notes (Signed)
DISCONTINUE OFF PATHWAY REGIMEN - Breast   [id number]:Weekly paclitaxel + (trastuzumab + pertuzumab q21 days):   A cycle is every 21 days:     Pertuzumab      Pertuzumab      Trastuzumab-xxxx      Trastuzumab-xxxx      Paclitaxel   **Always confirm dose/schedule in your pharmacy ordering system**  REASON: Toxicities / Adverse Event PRIOR TREATMENT: Off Pathway: Weekly paclitaxel + (trastuzumab + pertuzumab q21 days) TREATMENT RESPONSE: Stable Disease (SD)  Breast - No Medical Intervention - Off Treatment.  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

## 2018-04-08 NOTE — Progress Notes (Signed)
CRITICAL VALUE STICKER  CRITICAL VALUE:  Potassium 2.8   RECEIVER (on-site recipient of call):Makina Skow [place] NOTIFIED: 04/08/2018 08:50  MESSENGER (representative from lab):Jenn   MD NOTIFIED: 04/08/2018   TIME OF NOTIFICATION:  08:52

## 2018-04-08 NOTE — Telephone Encounter (Signed)
Scheduled appt per 3/27 los,  Printed calendar and avs.

## 2018-04-15 ENCOUNTER — Inpatient Hospital Stay: Payer: Medicaid Other | Attending: Hematology

## 2018-04-15 ENCOUNTER — Inpatient Hospital Stay: Payer: Medicaid Other

## 2018-04-15 ENCOUNTER — Other Ambulatory Visit: Payer: Self-pay | Admitting: Hematology

## 2018-04-15 ENCOUNTER — Other Ambulatory Visit: Payer: Self-pay

## 2018-04-15 VITALS — BP 102/57 | HR 73 | Temp 98.4°F | Resp 18

## 2018-04-15 DIAGNOSIS — C50911 Malignant neoplasm of unspecified site of right female breast: Secondary | ICD-10-CM | POA: Insufficient documentation

## 2018-04-15 DIAGNOSIS — Z95828 Presence of other vascular implants and grafts: Secondary | ICD-10-CM

## 2018-04-15 DIAGNOSIS — G4700 Insomnia, unspecified: Secondary | ICD-10-CM | POA: Insufficient documentation

## 2018-04-15 DIAGNOSIS — Z79899 Other long term (current) drug therapy: Secondary | ICD-10-CM | POA: Insufficient documentation

## 2018-04-15 DIAGNOSIS — Z5112 Encounter for antineoplastic immunotherapy: Secondary | ICD-10-CM | POA: Diagnosis not present

## 2018-04-15 DIAGNOSIS — Z170 Estrogen receptor positive status [ER+]: Secondary | ICD-10-CM | POA: Insufficient documentation

## 2018-04-15 DIAGNOSIS — C7951 Secondary malignant neoplasm of bone: Secondary | ICD-10-CM | POA: Insufficient documentation

## 2018-04-15 DIAGNOSIS — E876 Hypokalemia: Secondary | ICD-10-CM | POA: Insufficient documentation

## 2018-04-15 DIAGNOSIS — Z923 Personal history of irradiation: Secondary | ICD-10-CM | POA: Diagnosis not present

## 2018-04-15 DIAGNOSIS — D6481 Anemia due to antineoplastic chemotherapy: Secondary | ICD-10-CM | POA: Diagnosis not present

## 2018-04-15 DIAGNOSIS — H905 Unspecified sensorineural hearing loss: Secondary | ICD-10-CM | POA: Insufficient documentation

## 2018-04-15 DIAGNOSIS — Z9221 Personal history of antineoplastic chemotherapy: Secondary | ICD-10-CM | POA: Diagnosis not present

## 2018-04-15 DIAGNOSIS — C799 Secondary malignant neoplasm of unspecified site: Secondary | ICD-10-CM

## 2018-04-15 DIAGNOSIS — T451X5D Adverse effect of antineoplastic and immunosuppressive drugs, subsequent encounter: Secondary | ICD-10-CM | POA: Insufficient documentation

## 2018-04-15 DIAGNOSIS — C50919 Malignant neoplasm of unspecified site of unspecified female breast: Secondary | ICD-10-CM

## 2018-04-15 LAB — COMPREHENSIVE METABOLIC PANEL
ALT: 17 U/L (ref 0–44)
AST: 18 U/L (ref 15–41)
Albumin: 3.4 g/dL — ABNORMAL LOW (ref 3.5–5.0)
Alkaline Phosphatase: 61 U/L (ref 38–126)
Anion gap: 9 (ref 5–15)
BUN: 8 mg/dL (ref 6–20)
CO2: 28 mmol/L (ref 22–32)
Calcium: 9.1 mg/dL (ref 8.9–10.3)
Chloride: 105 mmol/L (ref 98–111)
Creatinine, Ser: 0.78 mg/dL (ref 0.44–1.00)
GFR calc Af Amer: >60 mL/min (ref >60)
GFR calc non Af Amer: >60 mL/min (ref >60)
Glucose, Bld: 83 mg/dL (ref 70–99)
Potassium: 3.5 mmol/L (ref 3.5–5.1)
Sodium: 142 mmol/L (ref 135–145)
Total Bilirubin: 2.3 mg/dL — ABNORMAL HIGH (ref 0.3–1.2)
Total Protein: 6.2 g/dL — ABNORMAL LOW (ref 6.5–8.1)

## 2018-04-15 LAB — CBC WITH DIFFERENTIAL/PLATELET
Abs Immature Granulocytes: 0.03 10*3/uL (ref 0.00–0.07)
Basophils Absolute: 0 10*3/uL (ref 0.0–0.1)
Basophils Relative: 0 %
Eosinophils Absolute: 0.1 10*3/uL (ref 0.0–0.5)
Eosinophils Relative: 2 %
HCT: 32 % — ABNORMAL LOW (ref 36.0–46.0)
Hemoglobin: 10.6 g/dL — ABNORMAL LOW (ref 12.0–15.0)
Immature Granulocytes: 1 %
Lymphocytes Relative: 37 %
Lymphs Abs: 1.1 10*3/uL (ref 0.7–4.0)
MCH: 29.4 pg (ref 26.0–34.0)
MCHC: 33.1 g/dL (ref 30.0–36.0)
MCV: 88.6 fL (ref 80.0–100.0)
Monocytes Absolute: 0.4 10*3/uL (ref 0.1–1.0)
Monocytes Relative: 12 %
Neutro Abs: 1.5 10*3/uL — ABNORMAL LOW (ref 1.7–7.7)
Neutrophils Relative %: 48 %
Platelets: 155 10*3/uL (ref 150–400)
RBC: 3.61 MIL/uL — ABNORMAL LOW (ref 3.87–5.11)
RDW: 14.5 % (ref 11.5–15.5)
WBC: 3.1 10*3/uL — ABNORMAL LOW (ref 4.0–10.5)
nRBC: 0 % (ref 0.0–0.2)

## 2018-04-15 MED ORDER — ALPRAZOLAM 0.25 MG PO TABS: 0.2500 mg | ORAL_TABLET | Freq: Two times a day (BID) | ORAL | 0 refills | Status: DC | PRN

## 2018-04-15 MED ORDER — PALONOSETRON HCL INJECTION 0.25 MG/5ML
INTRAVENOUS | Status: AC
  Filled 2018-04-15: qty 5

## 2018-04-15 MED ORDER — HEPARIN SOD (PORK) LOCK FLUSH 100 UNIT/ML IV SOLN
500.0000 [IU] | Freq: Once | INTRAVENOUS | Status: DC | PRN
  Filled 2018-04-15: qty 5

## 2018-04-15 MED ORDER — DEXTROSE 5 % IV SOLN
Freq: Once | INTRAVENOUS | Status: AC
  Administered 2018-04-15: 10:00:00 via INTRAVENOUS
  Filled 2018-04-15: qty 250

## 2018-04-15 MED ORDER — DEXAMETHASONE SODIUM PHOSPHATE 10 MG/ML IJ SOLN
INTRAMUSCULAR | Status: AC
  Filled 2018-04-15: qty 1

## 2018-04-15 MED ORDER — SODIUM CHLORIDE 0.9% FLUSH
10.0000 mL | INTRAVENOUS | Status: DC | PRN
  Administered 2018-04-15: 13:00:00 10 mL
  Filled 2018-04-15: qty 10

## 2018-04-15 MED ORDER — DEXAMETHASONE SODIUM PHOSPHATE 10 MG/ML IJ SOLN
10.0000 mg | Freq: Once | INTRAMUSCULAR | Status: AC
  Administered 2018-04-15: 10 mg via INTRAVENOUS

## 2018-04-15 MED ORDER — ACETAMINOPHEN 325 MG PO TABS
650.0000 mg | ORAL_TABLET | Freq: Once | ORAL | Status: AC
  Administered 2018-04-15: 650 mg via ORAL

## 2018-04-15 MED ORDER — HEPARIN SOD (PORK) LOCK FLUSH 100 UNIT/ML IV SOLN
500.0000 [IU] | Freq: Once | INTRAVENOUS | Status: AC | PRN
  Administered 2018-04-15: 13:00:00 500 [IU]
  Filled 2018-04-15: qty 5

## 2018-04-15 MED ORDER — ACETAMINOPHEN 325 MG PO TABS
ORAL_TABLET | ORAL | Status: AC
  Filled 2018-04-15: qty 2

## 2018-04-15 MED ORDER — PALONOSETRON HCL INJECTION 0.25 MG/5ML
0.2500 mg | Freq: Once | INTRAVENOUS | Status: AC
  Administered 2018-04-15: 0.25 mg via INTRAVENOUS

## 2018-04-15 MED ORDER — ZOLEDRONIC ACID 4 MG/100ML IV SOLN
4.0000 mg | Freq: Once | INTRAVENOUS | Status: AC
  Administered 2018-04-15: 10:00:00 4 mg via INTRAVENOUS
  Filled 2018-04-15: qty 100

## 2018-04-15 MED ORDER — SODIUM CHLORIDE 0.9% FLUSH
10.0000 mL | Freq: Once | INTRAVENOUS | Status: AC
  Administered 2018-04-15: 10 mL
  Filled 2018-04-15: qty 10

## 2018-04-15 MED ORDER — ALTEPLASE 2 MG IJ SOLR
2.0000 mg | Freq: Once | INTRAMUSCULAR | Status: DC | PRN
  Filled 2018-04-15: qty 2

## 2018-04-15 MED ORDER — SODIUM CHLORIDE 0.9% FLUSH
10.0000 mL | INTRAVENOUS | Status: DC | PRN
  Filled 2018-04-15: qty 10

## 2018-04-15 MED ORDER — ZOLEDRONIC ACID 4 MG/5ML IV CONC
4.0000 mg | Freq: Once | INTRAVENOUS | Status: DC
  Filled 2018-04-15: qty 5

## 2018-04-15 MED ORDER — ZOLPIDEM TARTRATE 5 MG PO TABS: 5.0000 mg | ORAL_TABLET | Freq: Every evening | ORAL | 0 refills | Status: DC | PRN

## 2018-04-15 MED ORDER — FAM-TRASTUZUMAB DERUXTECAN-NXKI CHEMO 100 MG IV SOLR
300.0000 mg | Freq: Once | INTRAVENOUS | Status: AC
  Administered 2018-04-15: 11:00:00 300 mg via INTRAVENOUS
  Filled 2018-04-15: qty 15

## 2018-04-15 NOTE — Patient Instructions (Signed)
[facility] Discharge Instructions for Patients Receiving Chemotherapy  Today you received the following chemotherapy agents: Enhertu  To help prevent nausea and vomiting after your treatment, we encourage you to take your nausea medication as prescribed by your physician.    If you develop nausea and vomiting that is not controlled by your nausea medication, call the clinic.   BELOW ARE SYMPTOMS THAT SHOULD BE REPORTED IMMEDIATELY:  *FEVER GREATER THAN 100.5 F  *CHILLS WITH OR WITHOUT FEVER  NAUSEA AND VOMITING THAT IS NOT CONTROLLED WITH YOUR NAUSEA MEDICATION  *UNUSUAL SHORTNESS OF BREATH  *UNUSUAL BRUISING OR BLEEDING  TENDERNESS IN MOUTH AND THROAT WITH OR WITHOUT PRESENCE OF ULCERS  *URINARY PROBLEMS  *BOWEL PROBLEMS  UNUSUAL RASH Items with * indicate a potential emergency and should be followed up as soon as possible.  Feel free to call the clinic should you have any questions or concerns. The clinic phone number is [phone].  Please show the [facility] at check-in to the Emergency Department and triage nurse.

## 2018-04-15 NOTE — Progress Notes (Signed)
Per Dr. Burr Medico, ok to treat with total bili 2.3, ANC 1.5.

## 2018-04-19 ENCOUNTER — Telehealth: Payer: Self-pay | Admitting: *Deleted

## 2018-04-19 NOTE — Telephone Encounter (Signed)
-----   Message from Teodoro Spray, RN sent at 04/15/2018  2:28 PM EDT ----- Regarding: Dr. Burr Medico First time chemo followup Dr. Burr Medico, first time Enhertu. Pt tolerated well. Thank you

## 2018-04-19 NOTE — Telephone Encounter (Signed)
[facility] for chemotherapy F/U.  Interpreting service facilitated call.  Patient is doing well.  Denies n/v.  Denies any new side effects or symptoms.  Bowel and bladder functioning well.  Eating and drinking well.  Instructed to drink 64 oz minimum daily or at least the day before, of and after treatment.   Denies questions, needs or anything to report with provider at this time.  Encouraged to call [phone] Mon -Fri 8:00 am - 4:30 pm or anytime as needed for symptoms, changes or event outside office hours.

## 2018-04-21 ENCOUNTER — Encounter (HOSPITAL_COMMUNITY)
Admission: RE | Admit: 2018-04-21 | Discharge: 2018-04-21 | Disposition: A | Discharge status: Home / Self Care | Payer: Medicaid Other | Source: Ambulatory Visit | Attending: Hematology | Admitting: Hematology

## 2018-04-21 ENCOUNTER — Other Ambulatory Visit: Payer: Self-pay

## 2018-04-21 DIAGNOSIS — C50911 Malignant neoplasm of unspecified site of right female breast: Secondary | ICD-10-CM

## 2018-04-21 DIAGNOSIS — C7951 Secondary malignant neoplasm of bone: Secondary | ICD-10-CM | POA: Insufficient documentation

## 2018-04-21 LAB — GLUCOSE, CAPILLARY: Glucose-Capillary: 69 mg/dL — ABNORMAL LOW (ref 70–99)

## 2018-04-21 MED ORDER — FLUDEOXYGLUCOSE F - 18 (FDG) INJECTION
6.5800 | Freq: Once | INTRAVENOUS | Status: AC | PRN
  Administered 2018-04-21: 09:00:00 6.58 via INTRAVENOUS

## 2018-04-21 NOTE — Progress Notes (Signed)
[place]   [phone] [phone]   Clinic Follow up Note   Patient Care Team: [name], [name], [name] as PCP - General (Physician Assistant) [name], MD as Consulting Physician (Orthopedic Surgery)  Date of Service:  04/22/2018  CHIEF COMPLAINT: F/u of metastatic breast cancer  SUMMARY OF ONCOLOGIC HISTORY: Oncology History    Cancer Staging No matching staging information was found for the patient.       [name], right ([place])   11/1997 Cancer Diagnosis    Patient had 1.4 cm poorly differentiated right breast carcinoma diagnosed in Nov 1999 at age 23, 1/7 axillary nodes involved, ER/PR and HER 2 positive. She had mastectomy with the 7 axillary node evaluation, 4 cycles of adriamycin/ cytoxan followed by taxotere, then five years of tamoxifen thru June 2005 (no herceptin in 1999). She had bilateral oophorectomy in May 2005. She was briefly on aromatase inhibitor after tamoxifen, but discontinued this herself due to poor tolerance.    04/04/2015 Genetic Testing    Negative genetic testing on the breast/ovarian cancer pnael.  The Breast/Ovarian gene panel offered by [facility] includes sequencing and rearrangement analysis for the following 20 genes:  ATM, BARD1, BRCA1, BRCA2, BRIP1, CDH1, CHEK2, EPCAM, FANCC, MLH1, MSH2, MSH6, NBN, PALB2, PMS2, PTEN, RAD51C, RAD51D, TP53, and XRCC2.    06/26/2015 Pathology Results    Initial Path Report Bone, curettage, Left femur met, intramedullary subtroch tissue - METASTATIC ADENOCARCINOMA.  Estrogen Receptor: 95%, POSITIVE, STRONG STAINING INTENSITY Progesterone Receptor: 2%, POSITIVE, STRONG  HER2 (+), IHC 3+    06/26/2015 Surgery    Biopsy of metastatic tissue and stabilization with Affixus trochanteric nail, proximal and distal interlock of left femur by Dr. [name]     06/28/2015 Imaging    CT Chest w/ Contrast 1. Extensive right internal mammary chain  lymphadenopathy consistent with metastatic breast cancer. 2. Lytic metastases involving the posterior elements at T1 with probable nondisplaced pathologic fracture of the spinous process. 3. No other evidence of thoracic metastatic disease. 4. Trace bilateral pleural effusions with associated bibasilar atelectasis. 5. Indeterminate right thyroid nodule, not previously imaged. This could be evaluated with thyroid ultrasound as clinically warranted.    07/01/2015 Imaging    Bone Scan 1. Increased activity noted throughout the left femur. Although metastatic disease cannot be excluded, these changes are most likely secondary to prior surgery. 2. No other focal abnormalities identified to suggest metastatic disease.    07/12/2015 - 07/26/2015 Radiation Therapy    Left femur, 30 Gy in 10 fractions by Dr. Sondra Come     07/14/2015 Initial Diagnosis    [name], right ([place])    07/19/2015 Imaging    Initial PET Scan 1. Severe multifocal osseous metastatic disease, much greater than anticipated based on prior imaging. 2. Multifocal nodal metastases involving the right internal mammary, prevascular and subpectoral lymph nodes. No axillary pulmonary involvement identified. 3. No distant extra osseous metastases.    07/30/2015 - 11/29/2015 Chemotherapy    Docetaxel, Herceptin and Perjeta every 3 weeks X6 cycles, pt tolerated moderately well, restaging scan showed excellent response     09/18/2015 Imaging    CT Head w/wo Contrast 1. No acute intracranial abnormality or significant interval change. 2. Stable minimal periventricular white matter hypoattenuation on the right. This may reflect a remote ischemic injury. 3. No evidence for metastatic disease to the brain. 4. No focal soft tissue lesion to explain the patient's tenderness.  10/30/2015 Imaging    CT Abdomen Pelvis w/ Contrast 1. Few mildly prominent fluid-filled loops of small bowel scattered throughout the abdomen,  with intraluminal fluid density within the distal colon. Given the provided history, findings are suggestive of acute enteritis/diarrheal illness. 2. No other acute intra-abdominal or pelvic process identified. 3. Widespread osseous metastatic disease, better evaluated on most recent PET-CT from 07/19/2015. No pathologic fracture or other complication. 4. 11 mm hypodensity within the left kidney. This lesion measures intermediate density, and is indeterminate. While this lesion is similar in size relative to recent studies, this is increased in size relative to prior study from 2012. Further evaluation with dedicated renal mass protocol CT and/or MRI is recommended for complete characterization.    12/2015 - 05/14/2016 Chemotherapy    Maintenance Herceptin and pejeta every 3 weeks stopped on 05/14/16 due to disease progression    12/25/2015 Imaging    Restaging PET Scan 1. Markedly improved skeletal activity, with only several faint foci of residual accentuated metabolic activity, but resolution of the vast majority of the previously extensive osseous metastatic disease. 2. Resolution of the prior right internal mammary and right prevascular lymph nodes. 3. Residual subcutaneous edema and edema along fascia planes in the left upper thigh. This remains somewhat more than I would expect for placement of an IM nail 6 weeks ago. If there is leg swelling further down, consider Doppler venous ultrasound to rule out left lower extremity DVT. I do not perceive an obvious difference in density between the pelvic and common femoral veins on the noncontrast CT data. 4. Chronic right maxillary and right sphenoid sinusitis.    01/16/2016 - 10/20/2017 Anti-estrogen oral therapy    Letrozole 2.5 mg daily. She was switched to exemestane due to disease progression on 02/08/17. Stopped on 10/20/2017 when treatment changed to chemo     03/01/2016 Miscellaneous    Patient presented to ED following a fall; she reports she  landed on her back and is now experiencing back pain. The patient was evaluated and discharge home same day with pain control.    05/01/2016 Imaging    CT CAP IMPRESSION: 1. Stable exam.  No new or progressive disease identified. 2. No evidence for residual or recurrent adenopathy within the chest. 3. Stable bone metastasis.    05/01/2016 Imaging    BONE SCAN IMPRESSION: 1. Small focal uptake involving the anterior rib ends of the left second and right fourth ribs, new since the prior bone scan. The location of this uptake is more suggestive of a traumatic or inflammatory etiology as opposed to metastatic disease. No other evidence of metastatic disease. 2. There are other areas of stable uptake which are most likely degenerative/reactive, including right shoulder and cervical spine uptake and uptake along the right femur adjacent to the ORIF hardware.    05/20/2016 Imaging    NM PET Skull to Thigh IMPRESSION: 1. There multiple metastatic foci in the bony pelvis, including some new abnormal foci compare to the prior PET-CT, compatible with mildly progressive bony metastatic disease. Also, a left T11 vertebral hypermetabolic metastatic lesion is observed, new compared to the prior exam. 2. No active extraosseous malignancy is currently identified. 3. Right anterior fourth rib healing fracture, appears be    06/04/2016 - 01/08/2017 Chemotherapy    Second line chemotherapy Kadcyla every 3 weeks started on 06/04/16 and stopped on 01/08/17 due to mixed response.      10/05/2016 Imaging    CT CAP and Bone Scan:  Bones: left  intramedullary rod and left femoral head neck screw in place. In the proximal diaphysis of the left femur there is cortical thickening and irregularity. No lytic or blastic bone lesions. No fracture or vertebral endplate destruction.   No definite evidence of recurrent disease in the chest, abdomen, or pelvis.    10/28/2016 Imaging    CT A/P IMPRESSION: No acute  process demonstrated in the abdomen or pelvis. No evidence of bowel obstruction or inflammation.    11/04/2016 Imaging    DG foot complete left: IMPRESSION: No fracture or dislocation.  No soft tissue abnormality    11/23/2016 Imaging    CT RIGHT FEMUR IMPRESSION: Negative CT scan of the right thigh.  No visible metastatic disease.  CT LEFT FEMUR IMPRESSION: 1. Postsurgical changes related to prior cephalomedullary rod fixation of the left femur with linear lucency along the anterolateral proximal femoral cortex at level of the lesser trochanter, suspicious for nondisplaced fracture. 2. Slightly permeative appearance of the proximal left femoral cortex at the site of known prior osseous metastases is largely unchanged. 3. Unchanged patchy lucency and sclerosis along the anterior acetabulum, which appears to correspond with an area of faint uptake on the prior PET-CT, suspicious for osseous metastasis. These results will be called to the ordering clinician or representative by the Radiologist Assistant, and communication documented in the PACS or zVision Dashboard.      01/27/2017 Imaging    IMPRESSION: 1. Mixed response to osseous metastasis. Some hypermetabolic lesions are new and increasingly hypermetabolic, while another index lesion is decreasingly hypermetabolic. 2. No evidence of hypermetabolic soft tissue metastasis.     02/08/2017 - 10/19/2017 Antibody Plan    -Herceptin every 3 week since 02/08/17 -Oral Lanpantinib 1541m and decreased to 5099mdue to diarrhea and depression. Due to disease progression she swithced to Verzenio 15014mn 05/04/17. She did not toelrate well so she was switched to Ibrance 100m62m 05/31/17. Due to her neutropenia, Ibrance dose reduced to 75 mg daily, 3 weeks on, one-week off, stopped on 10/20/2017 due to disease progression.     04/30/2017 PET scan    IMPRESSION: 1. Primarily increase in hypermetabolism of osseous metastasis. An isolated  right acetabular lesion is decreased in hypermetabolism since the prior exam. 2. No evidence of hypermetabolic soft tissue metastasis. 3. Similar non FDG avid right-sided thyroid nodule, favoring a benign etiology. Recommend attention on follow-up.    08/30/2017 PET scan    08/30/2017 PET Scan  IMPRESSION: 1. Worsening osseous metastatic disease. 2. Focal hypermetabolism in the central spinal canal the L1 level, similar to the prior exam. Metastatic implant cannot be excluded. 3. Slight enlargement of a right thyroid nodule which now appears more cystic in character. Consider further evaluation with thyroid ultrasound. If patient is clinically hyperthyroid, consider nuclear medicine thyroid uptake and scan    08/30/2017 Progression    08/30/2017 PET Scan  IMPRESSION: 1. Worsening osseous metastatic disease. 2. Focal hypermetabolism in the central spinal canal the L1 level, similar to the prior exam. Metastatic implant cannot be excluded. 3. Slight enlargement of a right thyroid nodule which now appears more cystic in character. Consider further evaluation with thyroid ultrasound. If patient is clinically hyperthyroid, consider nuclear medicine thyroid uptake and scan    09/29/2017 - 10/11/2017 Radiation Therapy    Pt received 27 Gy in 9 fractions directed at the areas of significant uptake noted on recent bone scan the right pelvis and right proximal femur, with Dr, KinaSondra Come 10/19/2017 -  03/25/2018 Chemotherapy    -Herceptin every 3 weeks starting 02/08/17, perjeta added on 10/20/2017  -weekly Taxol started on 10/21/2017. Will reduce Taxol to 2 weeks on, 1 week off starting on 02/04/17.  Stoped due to mild disease progression    10/27/2017 Imaging    10/27/2017 CT AP IMPRESSION: 1. No acute abnormality. No evidence of bowel obstruction or acute bowel inflammation. Normal appendix. 2. Stable patchy sclerotic osseous metastases throughout the visualized skeleton. No new or progressive  metastatic disease in the abdomen or pelvis.    01/11/2018 Imaging    Whole Body Bone Scan 01/11/18  IMPRESSION: 1. Multifocal osseous metastases as described. 2. Right scapular metastasis. 3. Right superior thoracic spinous process. 4. Right sacral ala and right L5 metastases. 5. Right acetabular metastasis. 6. Right proximal femur metastasis. 7. Diffuse metastases throughout the left femur. 8. Anterior right fourth rib metastasis.    01/11/2018 Imaging    CT CAP W Contrast 01/11/18  IMPRESSION: 1. Similar-appearing osseous metastatic disease. 2. No evidence for progressed metastatic disease in the chest, abdomen or pelvis.    03/30/2018 Imaging    Bone Scan 03/30/18  IMPRESSION: Stable multifocal bony metastatic disease.    03/30/2018 Imaging     CT CAP W Contrast 03/30/18 IMPRESSION: 1. New mild contiguous wall thickening with associated mucosal hyperenhancement throughout the left colon and rectum, suggesting nonspecific infectious or inflammatory proctocolitis. Differential includes C diff colitis. 2. Stable faint patchy sclerosis in the iliac bones and lumbar vertebral bodies. No new or progressive osseous metastatic disease by CT. Please note that the osseous lesions are relatively occult by CT and would be better evaluated by PET-CT, as clinically warranted. 3. No evidence of extra osseous metastatic disease.  These results will be called to the ordering clinician or representative by the Radiologist Assistant, and communication documented in the PACS or zVision Dashboard.    04/15/2018 -  Chemotherapy    Enhertu q3weeks starting 04/15/18    04/21/2018 PET scan    PET 04/21/18  IMPRESSION: 1. Overall marked interval improvement in bony hypermetabolic disease. All of the previously identified hypermetabolic osseous metastases have decreased in hypermetabolism in the interval. There is a new tiny focus of hypermetabolism in the right aspect of the L3 vertebral body  that appears to correspond to a new 8 mm lucency, raising concern for new metastatic disease. Close attention on follow-up recommended. 2. Focal hypermetabolism in the central spinal canal at the level of L1 previously has resolved. 3. No hypermetabolic soft tissue disease on today's study. 4. Right thyroid nodule seen previously has decreased in the interval.       CURRENT THERAPY:  Enhertu q3weeks starting 04/15/18  INTERVAL HISTORY:  [name] is here for a follow up of treatment. She presents to the clinic today with her interpretor. She notes she is doing well. She still has left upper leg and right shoulder pain still. Her right hip pain is chronic from past surgery. She denies any chest pain or cough. She notes her diarrhea has resolved. She notes she has been eating more lately with no vomiting.  She tolerated first treatment of Enhertu with only fatigue.     REVIEW OF SYSTEMS:   Constitutional: Denies fevers, chills or abnormal weight loss Eyes: Denies blurriness of vision Ears, nose, mouth, throat, and face: Denies mucositis or sore throat Respiratory: Denies cough, dyspnea or wheezes Cardiovascular: Denies palpitation, chest discomfort or lower extremity swelling Gastrointestinal:  Denies nausea, heartburn or change in bowel  habits Skin: Denies abnormal skin rashes MSK: (+) left hip pain and right shoulder pain mild (+) chronic right hip pain  Lymphatics: Denies new lymphadenopathy or easy bruising Neurological:Denies numbness, tingling or new weaknesses Behavioral/Psych: Mood is stable, no new changes  All other systems were reviewed with the patient and are negative.  MEDICAL HISTORY:  Past Medical History:  Diagnosis Date   Abnormal Pap smear    Anxiety    Breast cancer ([place])    Cancer ([place]) 1999   breast-s/p mastectomy, chemo, rad   CIN I (cervical intraepithelial neoplasia I) 2003   by colpo   Congenital deafness    Deaf    Depression      Port-A-Cath in place 2017   for chemotherapy   Radiation 07/12/15-07/26/15   left femur 30 Gy   S/P bilateral oophorectomy     SURGICAL HISTORY: Past Surgical History:  Procedure Laterality Date   ABDOMINAL HYSTERECTOMY     INTRAMEDULLARY (IM) NAIL INTERTROCHANTERIC Left 06/26/2015   Procedure: LEFT BIOMET LONG AFFIXS NAIL;  Surgeon: [name], MD;  Location: [facility];  Service: Orthopedics;  Laterality: Left;   IR GENERIC HISTORICAL  08/06/2015   IR US GUIDE VASC ACCESS LEFT 08/06/2015 Debra Edouard, MD [facility]   IR GENERIC HISTORICAL  08/06/2015   IR FLUORO GUIDE CV LINE LEFT 08/06/2015 Debra Edouard, MD [facility]   IR GENERIC HISTORICAL  03/05/2016   IR CV LINE INJECTION 03/05/2016 Debra Daft, MD [facility]   LEFT HEART CATH AND CORONARY ANGIOGRAPHY N/A 12/13/2017   Procedure: LEFT HEART CATH AND CORONARY ANGIOGRAPHY;  Surgeon: Leonie Man, MD;  Location: [facility];  Service: Cardiovascular;  Laterality: N/A;   MASTECTOMY     right breast   MASTECTOMY     OVARIAN CYST REMOVAL     RADIOLOGY WITH ANESTHESIA Left 06/25/2015   Procedure: MRI OF LEFT HIP WITH OR WITHOUT CONTRAST;  Surgeon: Medication Radiologist, MD;  Location: [facility];  Service: Radiology;  Laterality: Left;  DR. MCINTYRE/MRI   TUBAL LIGATION      I have reviewed the social history and family history with the patient and they are unchanged from previous note.  ALLERGIES:  is allergic to chlorhexidine; promethazine hcl; and diphenhydramine hcl.  MEDICATIONS:  Current Outpatient Medications  Medication Sig Dispense Refill   albuterol (PROVENTIL HFA;VENTOLIN HFA) 108 (90 Base) MCG/ACT inhaler Inhale 2 puffs into the lungs every 6 (six) hours as needed for wheezing or shortness of breath. 1 Inhaler 0   ALPRAZolam (XANAX) 0.25 MG tablet Take 1 tablet (0.25 mg total) by mouth 2 (two) times daily as needed for anxiety. 30 tablet 0   cyclobenzaprine (FLEXERIL) 5 MG tablet Take 1 tablet  (5 mg total) by mouth 3 (three) times daily as needed for muscle spasms. 30 tablet 0   diphenoxylate-atropine (LOMOTIL) 2.5-0.025 MG tablet Take 1-2 tablets by mouth 4 (four) times daily as needed for diarrhea or loose stools. 30 tablet 1   HYDROcodone-acetaminophen (NORCO) 10-325 MG tablet Take 1 tablet by mouth every 6 (six) hours as needed. 45 tablet 0   levofloxacin (LEVAQUIN) 750 MG tablet Take 1 tablet (750 mg total) by mouth daily. 7 tablet 0   lidocaine-prilocaine (EMLA) cream Apply 1 application topically as needed. Apply to Porta-Cath 1-2 hours prior to access as directed. 30 g 2   metroNIDAZOLE (FLAGYL) 500 MG tablet Take 1 tablet (500 mg total) by mouth 3 (three) times daily. 30 tablet 0  omeprazole (PRILOSEC) 20 MG capsule Take 1 capsule (20 mg total) by mouth daily. 30 capsule 0   oxyCODONE (OXY IR/ROXICODONE) 5 MG immediate release tablet Take 1 tablet (5 mg total) by mouth every 6 (six) hours as needed for severe pain. 30 tablet 0   potassium chloride (K-DUR) 10 MEQ tablet Take 2 tablets (20 mEq total) by mouth 2 (two) times daily. 180 tablet 3   prochlorperazine (COMPAZINE) 10 MG tablet Take 1 tablet (10 mg total) by mouth every 6 (six) hours as needed for nausea or vomiting. 60 tablet 2   zolpidem (AMBIEN) 5 MG tablet Take 1 tablet (5 mg total) by mouth at bedtime as needed for sleep. 30 tablet 0   No current facility-administered medications for this visit.    Facility-Administered Medications Ordered in Other Visits  Medication Dose Route Frequency Provider Last Rate Last Dose   sodium chloride 0.9 % 1,000 mL with potassium chloride 10 mEq infusion   Intravenous Continuous Livesay, Tamala Julian, MD   Stopped at 09/10/15 1653   sodium chloride 0.9 % injection 10 mL  10 mL Intravenous PRN Livesay, Lennis P, MD        PHYSICAL EXAMINATION: ECOG PERFORMANCE STATUS: 1 - Symptomatic but completely ambulatory  Vitals:   04/22/18 0830  BP: 121/77  Pulse: 88  Resp: 18    Temp: 97.7 F (36.5 C)  SpO2: 100%   Filed Weights   04/22/18 0830  Weight: 127 lb 14.4 oz (58 kg)    GENERAL:alert, no distress and comfortable SKIN: skin color, texture, turgor are normal, no rashes or significant lesions EYES: normal, Conjunctiva are pink and non-injected, sclera clear OROPHARYNX:no exudate, no erythema and lips, buccal mucosa, and tongue normal  NECK: supple, thyroid normal size, non-tender, without nodularity LYMPH:  no palpable lymphadenopathy in the cervical, axillary or inguinal LUNGS: clear to auscultation and percussion with normal breathing effort HEART: regular rate & rhythm and no murmurs and no lower extremity edema ABDOMEN:abdomen soft, non-tender and normal bowel sounds Musculoskeletal:no cyanosis of digits and no clubbing  NEURO: alert & oriented x 3 with fluent speech, no focal motor/sensory deficits  LABORATORY DATA:  I have reviewed the data as listed CBC Latest Ref Rng & Units 04/22/2018 04/15/2018 04/08/2018  WBC 4.0 - 10.5 K/uL 4.2 3.1(L) 3.2(L)  Hemoglobin 12.0 - 15.0 g/dL 11.4(L) 10.6(L) 9.7(L)  Hematocrit 36.0 - 46.0 % 34.2(L) 32.0(L) 29.8(L)  Platelets 150 - 400 K/uL 166 155 160     CMP Latest Ref Rng & Units 04/22/2018 04/15/2018 04/08/2018  Glucose 70 - 99 mg/dL 92 83 78  BUN 6 - 20 mg/dL 7 8 7   Creatinine 0.44 - 1.00 mg/dL 0.84 0.78 0.78  Sodium 135 - 145 mmol/L 138 142 143  Potassium 3.5 - 5.1 mmol/L 3.6 3.5 2.8(LL)  Chloride 98 - 111 mmol/L 105 105 108  CO2 22 - 32 mmol/L 22 28 25   Calcium 8.9 - 10.3 mg/dL 8.8(L) 9.1 8.3(L)  Total Protein 6.5 - 8.1 g/dL 6.7 6.2(L) 5.9(L)  Total Bilirubin 0.3 - 1.2 mg/dL 2.1(H) 2.3(H) 1.3(H)  Alkaline Phos 38 - 126 U/L 61 61 59  AST 15 - 41 U/L 15 18 14(L)  ALT 0 - 44 U/L 16 17 14       RADIOGRAPHIC STUDIES: I have personally reviewed the radiological images as listed and agreed with the findings in the report. Nm Pet Image Restag (ps) Skull Base To Thigh  Result Date: 04/21/2018 CLINICAL  DATA:  Subsequent treatment strategy  for right breast cancer. EXAM: NUCLEAR MEDICINE PET SKULL BASE TO THIGH TECHNIQUE: 6.6 mCi F-18 FDG was injected intravenously. Full-ring PET imaging was performed from the skull base to thigh after the radiotracer. CT data was obtained and used for attenuation correction and anatomic localization. Fasting blood glucose: 69 mg/dl COMPARISON:  08/30/2017 FINDINGS: Mediastinal blood pool activity: SUV max 2.5 NECK: No hypermetabolic lymph nodes in the neck. Incidental CT findings: 19 mm right thyroid lesion measured previously is decreased to 9 mm in the interval. CHEST: No hypermetabolic mediastinal or hilar nodes. No suspicious pulmonary nodules on the CT scan. Incidental CT findings: Left Port-A-Cath tip is positioned at the SVC/RA junction. ABDOMEN/PELVIS: No abnormal hypermetabolic activity within the liver, pancreas, adrenal glands, or spleen. No hypermetabolic lymph nodes in the abdomen or pelvis. Incidental CT findings: Gallbladder surgically absent. Low-density interpolar left renal lesion seen on previous study not well demonstrated today. SKELETON: Previously identified hypermetabolic bone disease has decreased in the interval. Right glenoid lesion measured previously at SUV max = 4.7 is now 3.3. Right sacral lesion demonstrating SUV max = 17.7 previously is now 2.4. Posterior left iliac lesion with SUV max = 18.1 previously is now 2.5. Right acetabular lesion with SUV max = 14.8 previously now measures 3.5. A new 8 mm subtle lucent lesion in the right aspect of the L3 vertebral body (128/5) demonstrates SUV max = 4.2. The focal hypermetabolism identified in the spinal colon now at the level of L1 previously is no longer evident. Incidental CT findings: none IMPRESSION: 1. Overall marked interval improvement in bony hypermetabolic disease. All of the previously identified hypermetabolic osseous metastases have decreased in hypermetabolism in the interval. There is a new  tiny focus of hypermetabolism in the right aspect of the L3 vertebral body that appears to correspond to a new 8 mm lucency, raising concern for new metastatic disease. Close attention on follow-up recommended. 2. Focal hypermetabolism in the central spinal canal at the level of L1 previously has resolved. 3. No hypermetabolic soft tissue disease on today's study. 4. Right thyroid nodule seen previously has decreased in the interval. Electronically Signed   By: Misty Stanley M.D.   On: 04/21/2018 14:48     ASSESSMENT & PLAN:  BLAKLEY MICHNA is a 53 y.o. 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 -She was recently on weekly Taxol, Herceptin and Perjetaq3weeks, but had mild disease progression as seen on 03/30/18 CT scan  -She started Enhertu q3weeks on 04/15/18. She tolerated first cycle very well. Will watch for Pneumonia as Enhertu increases her risk of developing. Otherwise possible side effects are milder than her prior chemo. She should be able to tolerate well.  -I personally reviewed her PET images and discussed her results which shows near complete radiographic response of her bone mets and no other new mets. She has had great response to last chemo treatment. Due to her tolerance issue, I have switched her chemo to Enhertu, she tolerated first treatment well last week, will continue every 3 weeks.  -She is clinically doing much better, diarrhea, nausea has resolved and appetite normal. Labs reviewed, CBC WNL except hg at 11.4, CMP WNL except tbil 2.1.  -F/u in 2 weeks   2.N&V and Diarrhea, weight loss  -started about 3-4 weeks ago, onset about 1 week after trip to  [place]. -She has tried imodium and Lomotil which did not help and she is not using much -CT scan  AP from 03/30/18 shows left colonall thickeningsuspicious of colitis.  -Her C-Diff test and stool GI panel were negative.  -She previously completed antibiotics Flagyl and Levaquin -Diet, nausea, appetite have fully recovered. She still has lower energy but stays active.    3. Body pain, secondary to bone mets  -she has had b/l hip, shoulder and chest pain, likely related to her bone mets -overall improved and manageable  -Ipreviouslyprescribed herGabapentin, but decided not to take due to potential side effect of suicidal thoughts.  -Her right hip pain is chronic from prior surgery.  -Her left hip and right shoulder aches are controlled with Bengay cream. She only takesOxycodone 47m as needed(every a few days)  4.Congenital deafness  5.Mild anemia, secondary to chemotherapy and malignancy -Stable, Hgat11.4(04/22/18)  -She will start with oral iron daily for 1-2 months and then stop. She will also start daily women's vitamin.   6.Hypokalemia -He has intermittent hyperkalemia, on oral supplementKCL. -N&V and Diarrhea has worsened in the pastfew weeks -K at 3.6 (04/22/18). Continue oral potassiumto 2 in the AM and PM and 1 pill in the afternoon.   7.Goal of care discussion -she understand her goal of care is palliative to prolong her life -she is full code for now  8.Insomnia -She cannot take Benadryl due to allergy reaction. She has tried trazodone with no relief.  -Her insomnia is not currently controlled with 5723mAmbien. I previously increase her to 7.23m38mt night.     PLAN: -Labs and PET scan reviewed with her. She has had great response to treatment. Will continue Enherto -Lab, flush, f/u and Enherto in 2 and 5 weeks    No problem-specific Assessment & Plan notes found for this encounter.   No orders of the defined types were placed in this encounter.  All questions were answered. The patient knows to call the clinic with any problems,  questions or concerns. No barriers to learning was detected. I spent 25 minutes counseling the patient face to face. The total time spent in the appointment was 30 minutes and more than 50% was on counseling and review of test results     YanTruitt MerleD 04/22/2018   I, AmoJoslyn Devonm acting as scribe for YanTruitt MerleD.   I have reviewed the above documentation for accuracy and completeness, and I agree with the above.

## 2018-04-22 ENCOUNTER — Inpatient Hospital Stay: Payer: Medicaid Other

## 2018-04-22 ENCOUNTER — Inpatient Hospital Stay (HOSPITAL_BASED_OUTPATIENT_CLINIC_OR_DEPARTMENT_OTHER): Payer: Medicaid Other | Admitting: Hematology

## 2018-04-22 ENCOUNTER — Other Ambulatory Visit: Payer: Self-pay

## 2018-04-22 ENCOUNTER — Encounter: Payer: Self-pay | Admitting: Hematology

## 2018-04-22 ENCOUNTER — Telehealth: Payer: Self-pay | Admitting: Hematology

## 2018-04-22 VITALS — BP 121/77 | HR 88 | Temp 97.7°F | Resp 18 | Ht 65.0 in | Wt 127.9 lb

## 2018-04-22 DIAGNOSIS — C50911 Malignant neoplasm of unspecified site of right female breast: Secondary | ICD-10-CM | POA: Diagnosis not present

## 2018-04-22 DIAGNOSIS — Z79899 Other long term (current) drug therapy: Secondary | ICD-10-CM | POA: Diagnosis not present

## 2018-04-22 DIAGNOSIS — Z170 Estrogen receptor positive status [ER+]: Secondary | ICD-10-CM

## 2018-04-22 DIAGNOSIS — Z923 Personal history of irradiation: Secondary | ICD-10-CM

## 2018-04-22 DIAGNOSIS — C50919 Malignant neoplasm of unspecified site of unspecified female breast: Secondary | ICD-10-CM

## 2018-04-22 DIAGNOSIS — Z9221 Personal history of antineoplastic chemotherapy: Secondary | ICD-10-CM | POA: Diagnosis not present

## 2018-04-22 DIAGNOSIS — T451X5D Adverse effect of antineoplastic and immunosuppressive drugs, subsequent encounter: Secondary | ICD-10-CM

## 2018-04-22 DIAGNOSIS — H905 Unspecified sensorineural hearing loss: Secondary | ICD-10-CM | POA: Diagnosis not present

## 2018-04-22 DIAGNOSIS — E876 Hypokalemia: Secondary | ICD-10-CM

## 2018-04-22 DIAGNOSIS — Z5112 Encounter for antineoplastic immunotherapy: Secondary | ICD-10-CM | POA: Diagnosis not present

## 2018-04-22 DIAGNOSIS — Z95828 Presence of other vascular implants and grafts: Secondary | ICD-10-CM

## 2018-04-22 DIAGNOSIS — C799 Secondary malignant neoplasm of unspecified site: Principal | ICD-10-CM

## 2018-04-22 DIAGNOSIS — G4700 Insomnia, unspecified: Secondary | ICD-10-CM | POA: Diagnosis not present

## 2018-04-22 DIAGNOSIS — C7951 Secondary malignant neoplasm of bone: Secondary | ICD-10-CM | POA: Diagnosis not present

## 2018-04-22 DIAGNOSIS — D6481 Anemia due to antineoplastic chemotherapy: Secondary | ICD-10-CM

## 2018-04-22 LAB — CBC WITH DIFFERENTIAL/PLATELET
Abs Immature Granulocytes: 0.02 10*3/uL (ref 0.00–0.07)
Basophils Absolute: 0 10*3/uL (ref 0.0–0.1)
Basophils Relative: 1 %
Eosinophils Absolute: 0.1 10*3/uL (ref 0.0–0.5)
Eosinophils Relative: 2 %
HCT: 34.2 % — ABNORMAL LOW (ref 36.0–46.0)
Hemoglobin: 11.4 g/dL — ABNORMAL LOW (ref 12.0–15.0)
Immature Granulocytes: 1 %
Lymphocytes Relative: 25 %
Lymphs Abs: 1 10*3/uL (ref 0.7–4.0)
MCH: 29.5 pg (ref 26.0–34.0)
MCHC: 33.3 g/dL (ref 30.0–36.0)
MCV: 88.4 fL (ref 80.0–100.0)
Monocytes Absolute: 0.1 10*3/uL (ref 0.1–1.0)
Monocytes Relative: 3 %
Neutro Abs: 2.9 10*3/uL (ref 1.7–7.7)
Neutrophils Relative %: 68 %
Platelets: 166 10*3/uL (ref 150–400)
RBC: 3.87 MIL/uL (ref 3.87–5.11)
RDW: 13.5 % (ref 11.5–15.5)
WBC: 4.2 10*3/uL (ref 4.0–10.5)
nRBC: 0 % (ref 0.0–0.2)

## 2018-04-22 LAB — COMPREHENSIVE METABOLIC PANEL
ALT: 16 U/L (ref 0–44)
AST: 15 U/L (ref 15–41)
Albumin: 3.5 g/dL (ref 3.5–5.0)
Alkaline Phosphatase: 61 U/L (ref 38–126)
Anion gap: 11 (ref 5–15)
BUN: 7 mg/dL (ref 6–20)
CO2: 22 mmol/L (ref 22–32)
Calcium: 8.8 mg/dL — ABNORMAL LOW (ref 8.9–10.3)
Chloride: 105 mmol/L (ref 98–111)
Creatinine, Ser: 0.84 mg/dL (ref 0.44–1.00)
GFR calc Af Amer: >60 mL/min (ref >60)
GFR calc non Af Amer: >60 mL/min (ref >60)
Glucose, Bld: 92 mg/dL (ref 70–99)
Potassium: 3.6 mmol/L (ref 3.5–5.1)
Sodium: 138 mmol/L (ref 135–145)
Total Bilirubin: 2.1 mg/dL — ABNORMAL HIGH (ref 0.3–1.2)
Total Protein: 6.7 g/dL (ref 6.5–8.1)

## 2018-04-22 MED ORDER — SODIUM CHLORIDE 0.9% FLUSH
10.0000 mL | INTRAVENOUS | Status: DC | PRN
  Administered 2018-04-22: 08:00:00 10 mL via INTRAVENOUS
  Filled 2018-04-22: qty 10

## 2018-04-22 MED ORDER — HEPARIN SOD (PORK) LOCK FLUSH 100 UNIT/ML IV SOLN
500.0000 [IU] | Freq: Once | INTRAVENOUS | Status: DC | PRN
  Filled 2018-04-22: qty 5

## 2018-04-22 NOTE — Patient Instructions (Signed)

## 2018-04-22 NOTE — Telephone Encounter (Signed)
Scheduled appt per 4/10 los. °

## 2018-05-04 NOTE — Progress Notes (Signed)
[place]   [phone] Fax:([phone]) [phone]   Clinic Follow up Note   Patient Care Team: [name], [name], [name] as PCP - General (Physician Assistant) [name], MD as Consulting Physician (Orthopedic Surgery)  Date of Service:  05/06/2018  CHIEF COMPLAINT: F/u of metastatic breast cancer  SUMMARY OF ONCOLOGIC HISTORY: Oncology History    Cancer Staging No matching staging information was found for the patient.       [name], right ([place])   11/1997 Cancer Diagnosis    Patient had 1.4 cm poorly differentiated right breast carcinoma diagnosed in Nov 1999 at age 53, 1/7 axillary nodes involved, ER/PR and HER 2 positive. She had mastectomy with the 7 axillary node evaluation, 4 cycles of adriamycin/ cytoxan followed by taxotere, then five years of tamoxifen thru June 2005 (no herceptin in 1999). She had bilateral oophorectomy in May 2005. She was briefly on aromatase inhibitor after tamoxifen, but discontinued this herself due to poor tolerance.    04/04/2015 Genetic Testing    Negative genetic testing on the breast/ovarian cancer pnael.  The Breast/Ovarian gene panel offered by [facility] includes sequencing and rearrangement analysis for the following 20 genes:  ATM, BARD1, BRCA1, BRCA2, BRIP1, CDH1, CHEK2, EPCAM, FANCC, MLH1, MSH2, MSH6, NBN, PALB2, PMS2, PTEN, RAD51C, RAD51D, TP53, and XRCC2.    06/26/2015 Pathology Results    Initial Path Report Bone, curettage, Left femur met, intramedullary subtroch tissue - METASTATIC ADENOCARCINOMA.  Estrogen Receptor: 95%, POSITIVE, STRONG STAINING INTENSITY Progesterone Receptor: 2%, POSITIVE, STRONG  HER2 (+), IHC 3+    06/26/2015 Surgery    Biopsy of metastatic tissue and stabilization with Affixus trochanteric nail, proximal and distal interlock of left femur by Dr. [name]     06/28/2015 Imaging    CT Chest w/ Contrast 1. Extensive right internal mammary chain  lymphadenopathy consistent with metastatic breast cancer. 2. Lytic metastases involving the posterior elements at T1 with probable nondisplaced pathologic fracture of the spinous process. 3. No other evidence of thoracic metastatic disease. 4. Trace bilateral pleural effusions with associated bibasilar atelectasis. 5. Indeterminate right thyroid nodule, not previously imaged. This could be evaluated with thyroid ultrasound as clinically warranted.    07/01/2015 Imaging    Bone Scan 1. Increased activity noted throughout the left femur. Although metastatic disease cannot be excluded, these changes are most likely secondary to prior surgery. 2. No other focal abnormalities identified to suggest metastatic disease.    07/12/2015 - 07/26/2015 Radiation Therapy    Left femur, 30 Gy in 10 fractions by Dr. Sondra Come     07/14/2015 Initial Diagnosis    [name], right ([place])    07/19/2015 Imaging    Initial PET Scan 1. Severe multifocal osseous metastatic disease, much greater than anticipated based on prior imaging. 2. Multifocal nodal metastases involving the right internal mammary, prevascular and subpectoral lymph nodes. No axillary pulmonary involvement identified. 3. No distant extra osseous metastases.    07/30/2015 - 11/29/2015 Chemotherapy    Docetaxel, Herceptin and Perjeta every 3 weeks X6 cycles, pt tolerated moderately well, restaging scan showed excellent response     09/18/2015 Imaging    CT Head w/wo Contrast 1. No acute intracranial abnormality or significant interval change. 2. Stable minimal periventricular white matter hypoattenuation on the right. This may reflect a remote ischemic injury. 3. No evidence for metastatic disease to the brain. 4. No focal soft tissue lesion to explain the patient's tenderness.  10/30/2015 Imaging    CT Abdomen Pelvis w/ Contrast 1. Few mildly prominent fluid-filled loops of small bowel scattered throughout the abdomen,  with intraluminal fluid density within the distal colon. Given the provided history, findings are suggestive of acute enteritis/diarrheal illness. 2. No other acute intra-abdominal or pelvic process identified. 3. Widespread osseous metastatic disease, better evaluated on most recent PET-CT from 07/19/2015. No pathologic fracture or other complication. 4. 11 mm hypodensity within the left kidney. This lesion measures intermediate density, and is indeterminate. While this lesion is similar in size relative to recent studies, this is increased in size relative to prior study from 2012. Further evaluation with dedicated renal mass protocol CT and/or MRI is recommended for complete characterization.    12/2015 - 05/14/2016 Chemotherapy    Maintenance Herceptin and pejeta every 3 weeks stopped on 05/14/16 due to disease progression    12/25/2015 Imaging    Restaging PET Scan 1. Markedly improved skeletal activity, with only several faint foci of residual accentuated metabolic activity, but resolution of the vast majority of the previously extensive osseous metastatic disease. 2. Resolution of the prior right internal mammary and right prevascular lymph nodes. 3. Residual subcutaneous edema and edema along fascia planes in the left upper thigh. This remains somewhat more than I would expect for placement of an IM nail 6 weeks ago. If there is leg swelling further down, consider Doppler venous ultrasound to rule out left lower extremity DVT. I do not perceive an obvious difference in density between the pelvic and common femoral veins on the noncontrast CT data. 4. Chronic right maxillary and right sphenoid sinusitis.    01/16/2016 - 10/20/2017 Anti-estrogen oral therapy    Letrozole 2.5 mg daily. She was switched to exemestane due to disease progression on 02/08/17. Stopped on 10/20/2017 when treatment changed to chemo     03/01/2016 Miscellaneous    Patient presented to ED following a fall; she reports she  landed on her back and is now experiencing back pain. The patient was evaluated and discharge home same day with pain control.    05/01/2016 Imaging    CT CAP IMPRESSION: 1. Stable exam.  No new or progressive disease identified. 2. No evidence for residual or recurrent adenopathy within the chest. 3. Stable bone metastasis.    05/01/2016 Imaging    BONE SCAN IMPRESSION: 1. Small focal uptake involving the anterior rib ends of the left second and right fourth ribs, new since the prior bone scan. The location of this uptake is more suggestive of a traumatic or inflammatory etiology as opposed to metastatic disease. No other evidence of metastatic disease. 2. There are other areas of stable uptake which are most likely degenerative/reactive, including right shoulder and cervical spine uptake and uptake along the right femur adjacent to the ORIF hardware.    05/20/2016 Imaging    NM PET Skull to Thigh IMPRESSION: 1. There multiple metastatic foci in the bony pelvis, including some new abnormal foci compare to the prior PET-CT, compatible with mildly progressive bony metastatic disease. Also, a left T11 vertebral hypermetabolic metastatic lesion is observed, new compared to the prior exam. 2. No active extraosseous malignancy is currently identified. 3. Right anterior fourth rib healing fracture, appears be    06/04/2016 - 01/08/2017 Chemotherapy    Second line chemotherapy Kadcyla every 3 weeks started on 06/04/16 and stopped on 01/08/17 due to mixed response.      10/05/2016 Imaging    CT CAP and Bone Scan:  Bones: left  intramedullary rod and left femoral head neck screw in place. In the proximal diaphysis of the left femur there is cortical thickening and irregularity. No lytic or blastic bone lesions. No fracture or vertebral endplate destruction.   No definite evidence of recurrent disease in the chest, abdomen, or pelvis.    10/28/2016 Imaging    CT A/P IMPRESSION: No acute  process demonstrated in the abdomen or pelvis. No evidence of bowel obstruction or inflammation.    11/04/2016 Imaging    DG foot complete left: IMPRESSION: No fracture or dislocation.  No soft tissue abnormality    11/23/2016 Imaging    CT RIGHT FEMUR IMPRESSION: Negative CT scan of the right thigh.  No visible metastatic disease.  CT LEFT FEMUR IMPRESSION: 1. Postsurgical changes related to prior cephalomedullary rod fixation of the left femur with linear lucency along the anterolateral proximal femoral cortex at level of the lesser trochanter, suspicious for nondisplaced fracture. 2. Slightly permeative appearance of the proximal left femoral cortex at the site of known prior osseous metastases is largely unchanged. 3. Unchanged patchy lucency and sclerosis along the anterior acetabulum, which appears to correspond with an area of faint uptake on the prior PET-CT, suspicious for osseous metastasis. These results will be called to the ordering clinician or representative by the Radiologist Assistant, and communication documented in the PACS or zVision Dashboard.      01/27/2017 Imaging    IMPRESSION: 1. Mixed response to osseous metastasis. Some hypermetabolic lesions are new and increasingly hypermetabolic, while another index lesion is decreasingly hypermetabolic. 2. No evidence of hypermetabolic soft tissue metastasis.     02/08/2017 - 10/19/2017 Antibody Plan    -Herceptin every 3 week since 02/08/17 -Oral Lanpantinib '1500mg'$  and decreased to '500mg'$  due to diarrhea and depression. Due to disease progression she swithced to Verzenio '150mg'$  on 05/04/17. She did not toelrate well so she was switched to Ibrance '100mg'$  on 05/31/17. Due to her neutropenia, Ibrance dose reduced to 75 mg daily, 3 weeks on, one-week off, stopped on 10/20/2017 due to disease progression.     04/30/2017 PET scan    IMPRESSION: 1. Primarily increase in hypermetabolism of osseous metastasis. An isolated  right acetabular lesion is decreased in hypermetabolism since the prior exam. 2. No evidence of hypermetabolic soft tissue metastasis. 3. Similar non FDG avid right-sided thyroid nodule, favoring a benign etiology. Recommend attention on follow-up.    08/30/2017 PET scan    08/30/2017 PET Scan  IMPRESSION: 1. Worsening osseous metastatic disease. 2. Focal hypermetabolism in the central spinal canal the L1 level, similar to the prior exam. Metastatic implant cannot be excluded. 3. Slight enlargement of a right thyroid nodule which now appears more cystic in character. Consider further evaluation with thyroid ultrasound. If patient is clinically hyperthyroid, consider nuclear medicine thyroid uptake and scan    08/30/2017 Progression    08/30/2017 PET Scan  IMPRESSION: 1. Worsening osseous metastatic disease. 2. Focal hypermetabolism in the central spinal canal the L1 level, similar to the prior exam. Metastatic implant cannot be excluded. 3. Slight enlargement of a right thyroid nodule which now appears more cystic in character. Consider further evaluation with thyroid ultrasound. If patient is clinically hyperthyroid, consider nuclear medicine thyroid uptake and scan    09/29/2017 - 10/11/2017 Radiation Therapy    Pt received 27 Gy in 9 fractions directed at the areas of significant uptake noted on recent bone scan the right pelvis and right proximal femur, with Dr, Sondra Come     10/19/2017 -  03/25/2018 Chemotherapy    -Herceptin every 3 weeks starting 02/08/17, perjeta added on 10/20/2017  -weekly Taxol started on 10/21/2017. Will reduce Taxol to 2 weeks on, 1 week off starting on 02/04/17.  Stoped due to mild disease progression    10/27/2017 Imaging    10/27/2017 CT AP IMPRESSION: 1. No acute abnormality. No evidence of bowel obstruction or acute bowel inflammation. Normal appendix. 2. Stable patchy sclerotic osseous metastases throughout the visualized skeleton. No new or progressive  metastatic disease in the abdomen or pelvis.    01/11/2018 Imaging    Whole Body Bone Scan 01/11/18  IMPRESSION: 1. Multifocal osseous metastases as described. 2. Right scapular metastasis. 3. Right superior thoracic spinous process. 4. Right sacral ala and right L5 metastases. 5. Right acetabular metastasis. 6. Right proximal femur metastasis. 7. Diffuse metastases throughout the left femur. 8. Anterior right fourth rib metastasis.    01/11/2018 Imaging    CT CAP W Contrast 01/11/18  IMPRESSION: 1. Similar-appearing osseous metastatic disease. 2. No evidence for progressed metastatic disease in the chest, abdomen or pelvis.    03/30/2018 Imaging    Bone Scan 03/30/18  IMPRESSION: Stable multifocal bony metastatic disease.    03/30/2018 Imaging     CT CAP W Contrast 03/30/18 IMPRESSION: 1. New mild contiguous wall thickening with associated mucosal hyperenhancement throughout the left colon and rectum, suggesting nonspecific infectious or inflammatory proctocolitis. Differential includes C diff colitis. 2. Stable faint patchy sclerosis in the iliac bones and lumbar vertebral bodies. No new or progressive osseous metastatic disease by CT. Please note that the osseous lesions are relatively occult by CT and would be better evaluated by PET-CT, as clinically warranted. 3. No evidence of extra osseous metastatic disease.  These results will be called to the ordering clinician or representative by the Radiologist Assistant, and communication documented in the PACS or zVision Dashboard.    04/15/2018 -  Chemotherapy    Enhertu q3weeks starting 04/15/18    04/21/2018 PET scan    PET 04/21/18  IMPRESSION: 1. Overall marked interval improvement in bony hypermetabolic disease. All of the previously identified hypermetabolic osseous metastases have decreased in hypermetabolism in the interval. There is a new tiny focus of hypermetabolism in the right aspect of the L3 vertebral body  that appears to correspond to a new 8 mm lucency, raising concern for new metastatic disease. Close attention on follow-up recommended. 2. Focal hypermetabolism in the central spinal canal at the level of L1 previously has resolved. 3. No hypermetabolic soft tissue disease on today's study. 4. Right thyroid nodule seen previously has decreased in the interval.       CURRENT THERAPY:  Enhertuq3weeks starting 04/15/18  INTERVAL HISTORY:  [name] is here for a follow up and treatment. She presents to the clinic today with her interpretor. She notes she is doing well. She tolerated the first cycle Enhertu very well. She notes her right shoulder is still sore currently but can get up to an 8/10 and still has limited ROM. She notes Ambien helps her go to sleep at 9m at times.    REVIEW OF SYSTEMS:   Constitutional: Denies fevers, chills or abnormal weight loss Eyes: Denies blurriness of vision Ears, nose, mouth, throat, and face: Denies mucositis or sore throat Respiratory: Denies cough, dyspnea or wheezes Cardiovascular: Denies palpitation, chest discomfort or lower extremity swelling Gastrointestinal:  Denies nausea, heartburn or change in bowel habits Skin: Denies abnormal skin rashes MSK: (+) Right shoulder pain (8/10) and limited ROM  Lymphatics: Denies new lymphadenopathy or easy bruising Neurological:Denies numbness, tingling or new weaknesses Behavioral/Psych: Mood is stable, no new changes  All other systems were reviewed with the patient and are negative.  MEDICAL HISTORY:  Past Medical History:  Diagnosis Date  . Abnormal Pap smear   . Anxiety   . Breast cancer ([place])   . Cancer ([place]) 1999   breast-s/p mastectomy, chemo, rad  . CIN I (cervical intraepithelial neoplasia I) 2003   by colpo  . Congenital deafness   . Deaf   . Depression   . Port-A-Cath in place 2017   for chemotherapy  . Radiation 07/12/15-07/26/15   left femur 30 Gy  . S/P bilateral  oophorectomy     SURGICAL HISTORY: Past Surgical History:  Procedure Laterality Date  . ABDOMINAL HYSTERECTOMY    . INTRAMEDULLARY (IM) NAIL INTERTROCHANTERIC Left 06/26/2015   Procedure: LEFT BIOMET LONG AFFIXS NAIL;  Surgeon: [name], MD;  Location: [facility];  Service: Orthopedics;  Laterality: Left;  . IR GENERIC HISTORICAL  08/06/2015   IR US GUIDE VASC ACCESS LEFT 08/06/2015 Aletta Edouard, MD [facility]  . IR GENERIC HISTORICAL  08/06/2015   IR FLUORO GUIDE CV LINE LEFT 08/06/2015 Aletta Edouard, MD [facility]  . IR GENERIC HISTORICAL  03/05/2016   IR CV LINE INJECTION 03/05/2016 Markus Daft, MD [facility]  . LEFT HEART CATH AND CORONARY ANGIOGRAPHY N/A 12/13/2017   Procedure: LEFT HEART CATH AND CORONARY ANGIOGRAPHY;  Surgeon: Leonie Man, MD;  Location: [facility];  Service: Cardiovascular;  Laterality: N/A;  . MASTECTOMY     right breast  . MASTECTOMY    . OVARIAN CYST REMOVAL    . RADIOLOGY WITH ANESTHESIA Left 06/25/2015   Procedure: MRI OF LEFT HIP WITH OR WITHOUT CONTRAST;  Surgeon: Medication Radiologist, MD;  Location: [facility];  Service: Radiology;  Laterality: Left;  DR. MCINTYRE/MRI  . TUBAL LIGATION      I have reviewed the social history and family history with the patient and they are unchanged from previous note.  ALLERGIES:  is allergic to chlorhexidine; promethazine hcl; and diphenhydramine hcl.  MEDICATIONS:  Current Outpatient Medications  Medication Sig Dispense Refill  . albuterol (PROVENTIL HFA;VENTOLIN HFA) 108 (90 Base) MCG/ACT inhaler Inhale 2 puffs into the lungs every 6 (six) hours as needed for wheezing or shortness of breath. 1 Inhaler 0  . ALPRAZolam (XANAX) 0.25 MG tablet Take 1 tablet (0.25 mg total) by mouth 2 (two) times daily as needed for anxiety. 30 tablet 0  . cyclobenzaprine (FLEXERIL) 5 MG tablet Take 1 tablet (5 mg total) by mouth 3 (three) times daily as needed for muscle spasms. 30 tablet 0  . diphenoxylate-atropine  (LOMOTIL) 2.5-0.025 MG tablet Take 1-2 tablets by mouth 4 (four) times daily as needed for diarrhea or loose stools. 30 tablet 1  . HYDROcodone-acetaminophen (NORCO) 10-325 MG tablet Take 1 tablet by mouth every 6 (six) hours as needed. 45 tablet 0  . lidocaine-prilocaine (EMLA) cream Apply 1 application topically as needed. Apply to Porta-Cath 1-2 hours prior to access as directed. 30 g 2  . omeprazole (PRILOSEC) 20 MG capsule Take 1 capsule (20 mg total) by mouth daily. 30 capsule 0  . oxyCODONE (OXY IR/ROXICODONE) 5 MG immediate release tablet Take 1 tablet (5 mg total) by mouth every 6 (six) hours as needed for severe pain. 30 tablet 0  . potassium chloride (K-DUR) 10 MEQ tablet Take 2 tablets (20 mEq total) by mouth  2 (two) times daily. 180 tablet 3  . prochlorperazine (COMPAZINE) 10 MG tablet Take 1 tablet (10 mg total) by mouth every 6 (six) hours as needed for nausea or vomiting. 60 tablet 2  . zolpidem (AMBIEN) 5 MG tablet Take 1 tablet (5 mg total) by mouth at bedtime as needed for sleep. 30 tablet 0   No current facility-administered medications for this visit.    Facility-Administered Medications Ordered in Other Visits  Medication Dose Route Frequency Provider Last Rate Last Dose  . sodium chloride 0.9 % 1,000 mL with potassium chloride 10 mEq infusion   Intravenous Continuous Gordy Levan, MD   Stopped at 09/10/15 1653  . sodium chloride 0.9 % injection 10 mL  10 mL Intravenous PRN Livesay, Lennis P, MD      . sodium chloride flush (NS) 0.9 % injection 10 mL  10 mL Intracatheter PRN Truitt Merle, MD   10 mL at 05/06/18 1354    PHYSICAL EXAMINATION: ECOG PERFORMANCE STATUS: 1 - Symptomatic but completely ambulatory  Vitals:   05/06/18 0918  BP: 123/74  Pulse: 67  Resp: 17  Temp: (!) 97.3 F (36.3 C)  SpO2: 100%   Filed Weights   05/06/18 0918  Weight: 131 lb 8 oz (59.6 kg)    GENERAL:alert, no distress and comfortable SKIN: skin color, texture, turgor are normal, no  rashes or significant lesions EYES: normal, Conjunctiva are pink and non-injected, sclera clear OROPHARYNX:no exudate, no erythema and lips, buccal mucosa, and tongue normal  NECK: supple, thyroid normal size, non-tender, without nodularity LYMPH:  no palpable lymphadenopathy in the cervical, axillary or inguinal LUNGS: clear to auscultation and percussion with normal breathing effort HEART: regular rate & rhythm and no murmurs and no lower extremity edema ABDOMEN:abdomen soft, non-tender and normal bowel sounds Musculoskeletal:no cyanosis of digits and no clubbing  NEURO: alert & oriented x 3 with fluent speech, no focal motor/sensory deficits  LABORATORY DATA:  I have reviewed the data as listed CBC Latest Ref Rng & Units 05/06/2018 04/22/2018 04/15/2018  WBC 4.0 - 10.5 K/uL 2.6(L) 4.2 3.1(L)  Hemoglobin 12.0 - 15.0 g/dL 10.7(L) 11.4(L) 10.6(L)  Hematocrit 36.0 - 46.0 % 31.8(L) 34.2(L) 32.0(L)  Platelets 150 - 400 K/uL 201 166 155     CMP Latest Ref Rng & Units 05/06/2018 04/22/2018 04/15/2018  Glucose 70 - 99 mg/dL 89 92 83  BUN 6 - 20 mg/dL _0 Creatinine 0.44 - 1.00 mg/dL 0.77 0.84 0.78  Sodium 135 - 145 mmol/L 144 138 142  Potassium 3.5 - 5.1 mmol/L 2.7(LL) 3.6 3.5  Chloride 98 - 111 mmol/L 106 105 105  CO2 22 - 32 mmol/L _1 Calcium 8.9 - 10.3 mg/dL 9.1 8.8(L) 9.1  Total Protein 6.5 - 8.1 g/dL 6.4(L) 6.7 6.2(L)  Total Bilirubin 0.3 - 1.2 mg/dL 1.5(H) 2.1(H) 2.3(H)  Alkaline Phos 38 - 126 U/L 63 61 61  AST 15 - 41 U/L _2 ALT 0 - 44 U/L _3 RADIOGRAPHIC STUDIES: I have personally reviewed the radiological images as listed and agreed with the findings in the report. No results found.   ASSESSMENT & PLAN:  ROSELIE CIRIGLIANO is a 53 y.o. 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 -She was recently  on weekly Taxol, Herceptin and Perjetaq3weeks, but had mild disease progression as seen on 03/30/18 CT scan  -She started Enhertuq3weeks on 04/15/18. She tolerated first cycle very well. Will watch for Pneumonia as Enhertu increases her risk of penumonitis. She tolerated the first cycle well, still has mild diarrhea.  -She continues to clinically improve. Her energy is adequate, diarrhea is controlled and she is gaining weight.  -Labs reviewed, CBC and CMP WNL except WBC 2.6, hg 10.7, ANC 1.3, K at 2.7, protein 6.4, bili at 1.5. CA 27.29 is still pending. Overall adequate to proceed with cycle 2 Enhertu today  -F/u in 3 weeks    2.N&V and Diarrhea, weight loss  -onset about 1 week after trip to [place] in Feb. -She has tried imodium and Lomotil which did not help and she is not using much -CT scan AP from 03/30/18 shows left colonall thickeningsuspicious of colitis.  -Her C-Diff testand stoolGIpanel werenegative.  -She previously completedantibiotics [place] Levaquin -N&V subsided, appetite and energy have fully recovered. Mild residual diarrhea.    3. Body pain, secondary to bone mets  -she has had b/l hip, shoulder and chest pain, likely related to her bone mets -overall improved and manageable  -Ipreviouslyprescribed herGabapentin, but decided not to take due to potential side effect of suicidal thoughts.  -Her right hip pain is chronic from prior surgery.  -Her right shoulder causes her the most pain (8/10). Her left hip and right shoulder aches are controlled with Bengay cream.She only takesOxycodone 264m as needed(every a few days), I suggest her to do PT and dry needling or see orthpedics for her right shoulder pain and she declined  -I encouraged her to exercise her shoulder daily to increase ROM and to use heating pad.  -I encouraged her to follow up with orthopedist for more  intervention.  4.Congenital deafness  5.Mild anemia, secondary to chemotherapy and malignancy -She will start with oral iron daily for 1-2 months and then stop. She will also start daily women's vitamin.  -Hg at 10.7(05/06/18)   6.Hypokalemia -He has intermittent hyperkalemia, on oral supplementKCL. -N&V subsided and diarrhea is improving.  -K at 2.7(05/06/18).Will increase oral potassiumto 2 tabs TID with each meal for the next 3 days. I will give IV potassium today. I instructed her to reduce dairy products in diet.   7.Goal of care discussion -she understand her goal of care is palliative to prolong her life -she is full code for now  8.Insomnia -She cannot take Benadryl due to allergy reaction. She has tried trazodone with no relief.  -Her insomnia is not currently controlled with 5664mAmbien. I previously increase her to 7.64m27mt night.  -For the past 2 night she has increased Ambien to 79m2m help her fall asleep due to shoulder pain. I discussed that is the highest dose and she should not use 79mg89mry night.     PLAN: -Labs reviewed and adequate to proceed with C2 Enhertu today -Lab, flush, f/u and Enhertu in 3 weeks  -K at 2.7, will given IV potassium [id number]. Increase oral K to 2 tabs TID with each meal for next 3 days then return to 2 tab bid    No problem-specific Assessment & Plan notes found for this encounter.   No orders of the defined types were placed in this encounter.  All questions were answered. The patient knows to call the clinic with any problems, questions or concerns. No barriers to learning was detected. I spent 20 minutes  counseling the patient face to face. The total time spent in the appointment was 25 minutes and more than 50% was on counseling and review of test results     Truitt Merle, MD 05/06/2018   I, Joslyn Devon, am acting as scribe for Truitt Merle, MD.   I have reviewed the above documentation for accuracy and  completeness, and I agree with the above.

## 2018-05-05 ENCOUNTER — Other Ambulatory Visit: Payer: Medicaid Other

## 2018-05-05 ENCOUNTER — Ambulatory Visit: Payer: Medicaid Other | Admitting: Hematology

## 2018-05-06 ENCOUNTER — Other Ambulatory Visit: Payer: Self-pay

## 2018-05-06 ENCOUNTER — Inpatient Hospital Stay: Payer: Medicaid Other

## 2018-05-06 ENCOUNTER — Ambulatory Visit: Payer: Medicaid Other

## 2018-05-06 ENCOUNTER — Inpatient Hospital Stay (HOSPITAL_BASED_OUTPATIENT_CLINIC_OR_DEPARTMENT_OTHER): Payer: Medicaid Other | Admitting: Hematology

## 2018-05-06 ENCOUNTER — Ambulatory Visit: Payer: Medicaid Other | Admitting: Hematology

## 2018-05-06 ENCOUNTER — Telehealth: Payer: Self-pay | Admitting: Hematology

## 2018-05-06 ENCOUNTER — Other Ambulatory Visit: Payer: Medicaid Other

## 2018-05-06 ENCOUNTER — Encounter: Payer: Self-pay | Admitting: Hematology

## 2018-05-06 VITALS — BP 123/74 | HR 67 | Temp 97.3°F | Resp 17 | Ht 65.0 in | Wt 131.5 lb

## 2018-05-06 DIAGNOSIS — C50911 Malignant neoplasm of unspecified site of right female breast: Secondary | ICD-10-CM

## 2018-05-06 DIAGNOSIS — D6481 Anemia due to antineoplastic chemotherapy: Secondary | ICD-10-CM

## 2018-05-06 DIAGNOSIS — Z923 Personal history of irradiation: Secondary | ICD-10-CM | POA: Diagnosis not present

## 2018-05-06 DIAGNOSIS — C7951 Secondary malignant neoplasm of bone: Secondary | ICD-10-CM | POA: Diagnosis not present

## 2018-05-06 DIAGNOSIS — T451X5D Adverse effect of antineoplastic and immunosuppressive drugs, subsequent encounter: Secondary | ICD-10-CM | POA: Diagnosis not present

## 2018-05-06 DIAGNOSIS — H905 Unspecified sensorineural hearing loss: Secondary | ICD-10-CM

## 2018-05-06 DIAGNOSIS — Z95828 Presence of other vascular implants and grafts: Secondary | ICD-10-CM

## 2018-05-06 DIAGNOSIS — Z5112 Encounter for antineoplastic immunotherapy: Secondary | ICD-10-CM | POA: Diagnosis not present

## 2018-05-06 DIAGNOSIS — Z79899 Other long term (current) drug therapy: Secondary | ICD-10-CM

## 2018-05-06 DIAGNOSIS — E876 Hypokalemia: Secondary | ICD-10-CM

## 2018-05-06 DIAGNOSIS — G4700 Insomnia, unspecified: Secondary | ICD-10-CM

## 2018-05-06 DIAGNOSIS — C799 Secondary malignant neoplasm of unspecified site: Principal | ICD-10-CM

## 2018-05-06 DIAGNOSIS — Z170 Estrogen receptor positive status [ER+]: Secondary | ICD-10-CM | POA: Diagnosis not present

## 2018-05-06 DIAGNOSIS — Z9221 Personal history of antineoplastic chemotherapy: Secondary | ICD-10-CM

## 2018-05-06 DIAGNOSIS — C50919 Malignant neoplasm of unspecified site of unspecified female breast: Secondary | ICD-10-CM

## 2018-05-06 LAB — CBC WITH DIFFERENTIAL/PLATELET
Abs Immature Granulocytes: 0.02 10*3/uL (ref 0.00–0.07)
Basophils Absolute: 0 10*3/uL (ref 0.0–0.1)
Basophils Relative: 0 %
Eosinophils Absolute: 0.1 10*3/uL (ref 0.0–0.5)
Eosinophils Relative: 3 %
HCT: 31.8 % — ABNORMAL LOW (ref 36.0–46.0)
Hemoglobin: 10.7 g/dL — ABNORMAL LOW (ref 12.0–15.0)
Immature Granulocytes: 1 %
Lymphocytes Relative: 36 %
Lymphs Abs: 1 10*3/uL (ref 0.7–4.0)
MCH: 29.4 pg (ref 26.0–34.0)
MCHC: 33.6 g/dL (ref 30.0–36.0)
MCV: 87.4 fL (ref 80.0–100.0)
Monocytes Absolute: 0.3 10*3/uL (ref 0.1–1.0)
Monocytes Relative: 12 %
Neutro Abs: 1.3 10*3/uL — ABNORMAL LOW (ref 1.7–7.7)
Neutrophils Relative %: 48 %
Platelets: 201 10*3/uL (ref 150–400)
RBC: 3.64 MIL/uL — ABNORMAL LOW (ref 3.87–5.11)
RDW: 14.7 % (ref 11.5–15.5)
WBC: 2.6 10*3/uL — ABNORMAL LOW (ref 4.0–10.5)
nRBC: 0 % (ref 0.0–0.2)

## 2018-05-06 LAB — COMPREHENSIVE METABOLIC PANEL
ALT: 15 U/L (ref 0–44)
AST: 17 U/L (ref 15–41)
Albumin: 3.5 g/dL (ref 3.5–5.0)
Alkaline Phosphatase: 63 U/L (ref 38–126)
Anion gap: 13 (ref 5–15)
BUN: 10 mg/dL (ref 6–20)
CO2: 25 mmol/L (ref 22–32)
Calcium: 9.1 mg/dL (ref 8.9–10.3)
Chloride: 106 mmol/L (ref 98–111)
Creatinine, Ser: 0.77 mg/dL (ref 0.44–1.00)
GFR calc Af Amer: >60 mL/min (ref >60)
GFR calc non Af Amer: >60 mL/min (ref >60)
Glucose, Bld: 89 mg/dL (ref 70–99)
Potassium: 2.7 mmol/L — CL (ref 3.5–5.1)
Sodium: 144 mmol/L (ref 135–145)
Total Bilirubin: 1.5 mg/dL — ABNORMAL HIGH (ref 0.3–1.2)
Total Protein: 6.4 g/dL — ABNORMAL LOW (ref 6.5–8.1)

## 2018-05-06 MED ORDER — ACETAMINOPHEN 325 MG PO TABS
ORAL_TABLET | ORAL | Status: AC
  Filled 2018-05-06: qty 2

## 2018-05-06 MED ORDER — PALONOSETRON HCL INJECTION 0.25 MG/5ML
INTRAVENOUS | Status: AC
  Filled 2018-05-06: qty 5

## 2018-05-06 MED ORDER — DEXAMETHASONE SODIUM PHOSPHATE 10 MG/ML IJ SOLN
10.0000 mg | Freq: Once | INTRAMUSCULAR | Status: AC
  Administered 2018-05-06: 11:00:00 10 mg via INTRAVENOUS

## 2018-05-06 MED ORDER — FAM-TRASTUZUMAB DERUXTECAN-NXKI CHEMO 100 MG IV SOLR
300.0000 mg | Freq: Once | INTRAVENOUS | Status: AC
  Administered 2018-05-06: 13:00:00 300 mg via INTRAVENOUS
  Filled 2018-05-06: qty 15

## 2018-05-06 MED ORDER — DEXTROSE 5 % IV SOLN
Freq: Once | INTRAVENOUS | Status: AC
  Administered 2018-05-06: 10:00:00 via INTRAVENOUS
  Filled 2018-05-06: qty 250

## 2018-05-06 MED ORDER — ACETAMINOPHEN 325 MG PO TABS
650.0000 mg | ORAL_TABLET | Freq: Once | ORAL | Status: AC
  Administered 2018-05-06: 11:00:00 650 mg via ORAL

## 2018-05-06 MED ORDER — SODIUM CHLORIDE 0.9% FLUSH
10.0000 mL | INTRAVENOUS | Status: DC | PRN
  Administered 2018-05-06: 10 mL via INTRAVENOUS
  Filled 2018-05-06: qty 10

## 2018-05-06 MED ORDER — HEPARIN SOD (PORK) LOCK FLUSH 100 UNIT/ML IV SOLN
500.0000 [IU] | Freq: Once | INTRAVENOUS | Status: AC | PRN
  Administered 2018-05-06: 14:00:00 500 [IU]
  Filled 2018-05-06: qty 5

## 2018-05-06 MED ORDER — SODIUM CHLORIDE 0.9 % IJ SOLN
10.0000 mL | INTRAMUSCULAR | Status: DC | PRN
  Filled 2018-05-06: qty 10

## 2018-05-06 MED ORDER — SODIUM CHLORIDE 0.9% FLUSH
10.0000 mL | INTRAVENOUS | Status: DC | PRN
  Administered 2018-05-06: 14:00:00 10 mL
  Filled 2018-05-06: qty 10

## 2018-05-06 MED ORDER — POTASSIUM CHLORIDE 10 MEQ/100ML IV SOLN
10.0000 meq | INTRAVENOUS | Status: AC
  Administered 2018-05-06 (×2): 10 meq via INTRAVENOUS
  Filled 2018-05-06: qty 100

## 2018-05-06 MED ORDER — PALONOSETRON HCL INJECTION 0.25 MG/5ML
0.2500 mg | Freq: Once | INTRAVENOUS | Status: AC
  Administered 2018-05-06: 11:00:00 0.25 mg via INTRAVENOUS

## 2018-05-06 MED ORDER — DEXAMETHASONE SODIUM PHOSPHATE 10 MG/ML IJ SOLN
INTRAMUSCULAR | Status: AC
  Filled 2018-05-06: qty 1

## 2018-05-06 NOTE — Patient Instructions (Signed)

## 2018-05-06 NOTE — Progress Notes (Signed)
Per office note today (05/06/18) okay to proceed with treatment with ANC of 1.3. Please refer to office note.

## 2018-05-06 NOTE — Patient Instructions (Signed)
[facility] Discharge Instructions for Patients Receiving Chemotherapy  Today you received the following chemotherapy agents: Enhertu  To help prevent nausea and vomiting after your treatment, we encourage you to take your nausea medication as prescribed by your physician.    If you develop nausea and vomiting that is not controlled by your nausea medication, call the clinic.   BELOW ARE SYMPTOMS THAT SHOULD BE REPORTED IMMEDIATELY:  *FEVER GREATER THAN 100.5 F  *CHILLS WITH OR WITHOUT FEVER  NAUSEA AND VOMITING THAT IS NOT CONTROLLED WITH YOUR NAUSEA MEDICATION  *UNUSUAL SHORTNESS OF BREATH  *UNUSUAL BRUISING OR BLEEDING  TENDERNESS IN MOUTH AND THROAT WITH OR WITHOUT PRESENCE OF ULCERS  *URINARY PROBLEMS  *BOWEL PROBLEMS  UNUSUAL RASH Items with * indicate a potential emergency and should be followed up as soon as possible.  Feel free to call the clinic should you have any questions or concerns. The clinic phone number is [phone].  Please show the [employer] at check-in to the Emergency Department and triage nurse.

## 2018-05-06 NOTE — Telephone Encounter (Signed)
Per 4/24 los,appt already scheduled.

## 2018-05-06 NOTE — Telephone Encounter (Signed)
Scheduled appt per 4/24 sch message and LOS - pt to get an updated schedule in the treatment area.

## 2018-05-07 LAB — CANCER ANTIGEN 27.29: CA 27.29: 43.2 U/mL — ABNORMAL HIGH (ref 0.0–38.6)

## 2018-05-16 ENCOUNTER — Telehealth: Payer: Self-pay | Admitting: *Deleted

## 2018-05-16 ENCOUNTER — Other Ambulatory Visit: Payer: Self-pay | Admitting: Medical

## 2018-05-16 ENCOUNTER — Telehealth: Payer: Self-pay

## 2018-05-16 DIAGNOSIS — R630 Anorexia: Secondary | ICD-10-CM

## 2018-05-16 DIAGNOSIS — R112 Nausea with vomiting, unspecified: Secondary | ICD-10-CM

## 2018-05-16 NOTE — Telephone Encounter (Signed)
Left voice message for deaf interpreter to be presents for her appointment in the morning at 9:00 am with Lucianne Lei Tanner pA.

## 2018-05-16 NOTE — Telephone Encounter (Signed)
[name],  Would you please contact the patient and offer to have her come in the morning to be seen in symptom management.  I would also like her to have labs (CBC and CMP).  If she gets worse tonight she needs to go to the emergency room.  Thanks, Lucianne Lei

## 2018-05-16 NOTE — Telephone Encounter (Signed)
Message left as offered by Relay service for [name] [phone]).  Requested return call.     Voicemail received from video relay: "Throwing up the last three days straight with a lot of stomach pain when I eat.  Trying to bear through it.  Thought about going to emergency room but afraid because of COVID-19.

## 2018-05-16 NOTE — Telephone Encounter (Signed)
[name] has been scheduled for lab and [facility] visit tomorrow.  See collaborative nurse encounter note.

## 2018-05-16 NOTE — Telephone Encounter (Signed)
Patient calls regarding side effects she has had from Enhertu.  She is having N/V/D, headache, dizziness, shortness of breath, heart palpatations. She states she is having every side effect listed on the brochure that was given to her. She is refusing to receive this treatment (Enhertu) again.  I offered for patient to come in tomorrow for IVF and she is in agreement.  She is will come in at 9:00 to be seen in [facility].  Scheduling message has been sent.  Routed to all parties involved.

## 2018-05-17 ENCOUNTER — Inpatient Hospital Stay: Payer: Medicaid Other

## 2018-05-17 ENCOUNTER — Other Ambulatory Visit: Payer: Self-pay

## 2018-05-17 ENCOUNTER — Inpatient Hospital Stay: Payer: Medicaid Other | Attending: Hematology | Admitting: Medical

## 2018-05-17 VITALS — BP 115/73 | HR 75 | Temp 98.0°F | Resp 18 | Ht 65.0 in | Wt 128.1 lb

## 2018-05-17 DIAGNOSIS — R112 Nausea with vomiting, unspecified: Secondary | ICD-10-CM | POA: Insufficient documentation

## 2018-05-17 DIAGNOSIS — R197 Diarrhea, unspecified: Secondary | ICD-10-CM | POA: Diagnosis not present

## 2018-05-17 DIAGNOSIS — Z79899 Other long term (current) drug therapy: Secondary | ICD-10-CM | POA: Insufficient documentation

## 2018-05-17 DIAGNOSIS — T451X5A Adverse effect of antineoplastic and immunosuppressive drugs, initial encounter: Secondary | ICD-10-CM

## 2018-05-17 DIAGNOSIS — G4700 Insomnia, unspecified: Secondary | ICD-10-CM | POA: Diagnosis not present

## 2018-05-17 DIAGNOSIS — C7951 Secondary malignant neoplasm of bone: Secondary | ICD-10-CM | POA: Insufficient documentation

## 2018-05-17 DIAGNOSIS — H905 Unspecified sensorineural hearing loss: Secondary | ICD-10-CM | POA: Diagnosis not present

## 2018-05-17 DIAGNOSIS — R630 Anorexia: Secondary | ICD-10-CM

## 2018-05-17 DIAGNOSIS — Z170 Estrogen receptor positive status [ER+]: Secondary | ICD-10-CM | POA: Insufficient documentation

## 2018-05-17 DIAGNOSIS — R42 Dizziness and giddiness: Secondary | ICD-10-CM | POA: Insufficient documentation

## 2018-05-17 DIAGNOSIS — Z923 Personal history of irradiation: Secondary | ICD-10-CM | POA: Insufficient documentation

## 2018-05-17 DIAGNOSIS — R5383 Other fatigue: Secondary | ICD-10-CM | POA: Diagnosis not present

## 2018-05-17 DIAGNOSIS — E876 Hypokalemia: Secondary | ICD-10-CM | POA: Insufficient documentation

## 2018-05-17 DIAGNOSIS — Z9221 Personal history of antineoplastic chemotherapy: Secondary | ICD-10-CM | POA: Diagnosis not present

## 2018-05-17 DIAGNOSIS — D6481 Anemia due to antineoplastic chemotherapy: Secondary | ICD-10-CM | POA: Insufficient documentation

## 2018-05-17 DIAGNOSIS — Z5112 Encounter for antineoplastic immunotherapy: Secondary | ICD-10-CM | POA: Diagnosis present

## 2018-05-17 DIAGNOSIS — C50911 Malignant neoplasm of unspecified site of right female breast: Secondary | ICD-10-CM | POA: Diagnosis not present

## 2018-05-17 DIAGNOSIS — Z95828 Presence of other vascular implants and grafts: Secondary | ICD-10-CM

## 2018-05-17 LAB — CBC WITH DIFFERENTIAL (CANCER CENTER ONLY)
Abs Immature Granulocytes: 0.01 10*3/uL (ref 0.00–0.07)
Basophils Absolute: 0 10*3/uL (ref 0.0–0.1)
Basophils Relative: 1 %
Eosinophils Absolute: 0 10*3/uL (ref 0.0–0.5)
Eosinophils Relative: 1 %
HCT: 31.8 % — ABNORMAL LOW (ref 36.0–46.0)
Hemoglobin: 10.8 g/dL — ABNORMAL LOW (ref 12.0–15.0)
Immature Granulocytes: 0 %
Lymphocytes Relative: 24 %
Lymphs Abs: 0.9 10*3/uL (ref 0.7–4.0)
MCH: 29.8 pg (ref 26.0–34.0)
MCHC: 34 g/dL (ref 30.0–36.0)
MCV: 87.8 fL (ref 80.0–100.0)
Monocytes Absolute: 0.3 10*3/uL (ref 0.1–1.0)
Monocytes Relative: 8 %
Neutro Abs: 2.5 10*3/uL (ref 1.7–7.7)
Neutrophils Relative %: 66 %
Platelet Count: 157 10*3/uL (ref 150–400)
RBC: 3.62 MIL/uL — ABNORMAL LOW (ref 3.87–5.11)
RDW: 14 % (ref 11.5–15.5)
WBC Count: 3.7 10*3/uL — ABNORMAL LOW (ref 4.0–10.5)
nRBC: 0 % (ref 0.0–0.2)

## 2018-05-17 LAB — CMP (CANCER CENTER ONLY)
ALT: 16 U/L (ref 0–44)
AST: 16 U/L (ref 15–41)
Albumin: 3.5 g/dL (ref 3.5–5.0)
Alkaline Phosphatase: 64 U/L (ref 38–126)
Anion gap: 12 (ref 5–15)
BUN: 8 mg/dL (ref 6–20)
CO2: 24 mmol/L (ref 22–32)
Calcium: 8.8 mg/dL — ABNORMAL LOW (ref 8.9–10.3)
Chloride: 106 mmol/L (ref 98–111)
Creatinine: 0.81 mg/dL (ref 0.44–1.00)
GFR, Est AFR Am: >60 mL/min (ref >60)
GFR, Estimated: >60 mL/min (ref >60)
Glucose, Bld: 88 mg/dL (ref 70–99)
Potassium: 3 mmol/L — CL (ref 3.5–5.1)
Sodium: 142 mmol/L (ref 135–145)
Total Bilirubin: 1.4 mg/dL — ABNORMAL HIGH (ref 0.3–1.2)
Total Protein: 6.4 g/dL — ABNORMAL LOW (ref 6.5–8.1)

## 2018-05-17 MED ORDER — POTASSIUM CHLORIDE IN NACL 20-0.9 MEQ/L-% IV SOLN
Freq: Once | INTRAVENOUS | Status: AC
  Administered 2018-05-17: 11:00:00 via INTRAVENOUS
  Filled 2018-05-17: qty 1000

## 2018-05-17 MED ORDER — DEXAMETHASONE SODIUM PHOSPHATE 10 MG/ML IJ SOLN
INTRAMUSCULAR | Status: AC
  Filled 2018-05-17: qty 1

## 2018-05-17 MED ORDER — HEPARIN SOD (PORK) LOCK FLUSH 100 UNIT/ML IV SOLN
500.0000 [IU] | Freq: Once | INTRAVENOUS | Status: AC | PRN
  Administered 2018-05-17: 500 [IU] via INTRAVENOUS
  Filled 2018-05-17: qty 5

## 2018-05-17 MED ORDER — ONDANSETRON HCL 4 MG/2ML IJ SOLN
4.0000 mg | Freq: Once | INTRAMUSCULAR | Status: AC
  Administered 2018-05-17: 4 mg via INTRAVENOUS

## 2018-05-17 MED ORDER — DEXAMETHASONE SODIUM PHOSPHATE 10 MG/ML IJ SOLN
10.0000 mg | Freq: Once | INTRAMUSCULAR | Status: AC
  Administered 2018-05-17: 10 mg via INTRAVENOUS

## 2018-05-17 MED ORDER — ONDANSETRON HCL 4 MG/2ML IJ SOLN
INTRAMUSCULAR | Status: AC
  Filled 2018-05-17: qty 2

## 2018-05-17 MED ORDER — SODIUM CHLORIDE 0.9 % IJ SOLN
10.0000 mL | INTRAMUSCULAR | Status: DC | PRN
  Administered 2018-05-17: 10 mL via INTRAVENOUS
  Filled 2018-05-17: qty 10

## 2018-05-17 MED ORDER — SODIUM CHLORIDE 0.9 % IV SOLN
INTRAVENOUS | Status: DC
  Filled 2018-05-17: qty 1000

## 2018-05-17 MED ORDER — SODIUM CHLORIDE 0.9 % IV SOLN: 10.0000 mg | Freq: Once | INTRAVENOUS | Status: DC

## 2018-05-17 MED ORDER — SODIUM CHLORIDE 0.9 % IV SOLN
Freq: Once | INTRAVENOUS | Status: AC
  Administered 2018-05-17: 10:00:00 via INTRAVENOUS
  Filled 2018-05-17: qty 250

## 2018-05-17 NOTE — Progress Notes (Signed)
Symptoms Management Clinic Progress Note   [name] [id number] [birth year] 53 y.o.   [name] is managed by Dr. Truitt Merle  Actively treated with chemotherapy/immunotherapy/hormonal therapy: yes  Current therapy: Enhertu  Last treated: 05/06/2018 (cycle 2, day 1)  Next scheduled appointment with provider: 05/27/2018  Assessment: Plan:    [name], right ([place]) - Plan: heparin lock flush 100 unit/mL, sodium chloride 0.9 % injection 10 mL  DIZZINESS, CHRONIC  Chemotherapy induced nausea and vomiting  Port catheter in place - Plan: heparin lock flush 100 unit/mL, sodium chloride 0.9 % injection 10 mL  Non-intractable vomiting with nausea, unspecified vomiting type  Diarrhea, unspecified type  Fatigue, unspecified type  Anorexia   Metastatic breast cancer with metastatic disease to the bone: The patient is status post cycle 2, day 1 of Enhertu which was dosed on 05/06/2018.  She is scheduled for her next treatment on 05/27/2018.  Plans are to have the patient return on 05/27/2022 consideration of cycle 3 of Enhertu with plans for a dose reduction.  Patient is agreeable to this plan.  Dizziness, nausea and vomiting, diarrhea, fatigue, and anorexia: A CBC and a chemistry panel were collected today.  The patient was begun on 1 L of normal saline IV.  The patient was given a collection container for collection of a stool sample for C. difficile should she have recurrent  diarrhea.  Please see After Visit Summary for patient specific instructions.  Future Appointments  Date Time Provider [place]  05/27/2018  8:00 AM [facility] LAB 4 [facility] None  05/27/2018  8:15 AM [facility] [place] FLUSH [facility] None  05/27/2018  8:45 AM Truitt Merle, MD [facility] None  05/27/2018  9:45 AM [facility] INFUSION [facility] None  06/17/2018  7:45 AM [facility] LAB 4 [facility] None  06/17/2018  8:00 AM [facility] [place] FLUSH [facility] None    06/17/2018  8:15 AM Truitt Merle, MD [facility] None  06/17/2018  9:00 AM [facility] INFUSION [facility] None  07/08/2018  7:30 AM [facility] LAB 3 [facility] None  07/08/2018  8:00 AM [facility] [place] FLUSH [facility] None  07/08/2018  8:15 AM Truitt Merle, MD [facility] None  07/08/2018  9:00 AM [facility] INFUSION [facility] None    No orders of the defined types were placed in this encounter.      Subjective:   Patient ID:  [name] is a 53 y.o. (DOB [birth year]) female.  Chief Complaint:  Chief Complaint  Patient presents with   Nausea    HPI [name]  Is a 53 year old female with a history of a metastatic breast cancer with widespread bony metastasis.  She is managed by Dr. Truitt Merle and is status post treatment with Enhertu which was first dose on 04/15/2018.  A PET scan from 04/21/2018 returned showing:  1. Overall marked interval improvement in bony hypermetabolic disease. All of the previously identified hypermetabolic osseous metastases have decreased in hypermetabolism in the interval. There is a new tiny focus of hypermetabolism in the right aspect of the L3 vertebral body that appears to correspond to a new 8 mm lucency, raising concern for new metastatic disease. Close attention on follow-up recommended. 2. Focal hypermetabolism in the central spinal canal at the level of L1 previously has resolved. 3. No hypermetabolic soft tissue disease on today's study. 4. Right thyroid nodule seen previously has decreased in the interval.  The patient contacted our office yesterday reporting that she was having nausea, vomiting, diarrhea, headaches, dizziness,  shortness of breath, and heart palpitations since being treated with Enhertu.  She reported that she does not want to be treated with Enhertu again.  She reports that her symptoms began approximately 3 days ago.  She did not have any symptoms after her first cycle of treatment.  Yesterday she had diarrhea multiple  times.  Sometimes as often as every 30 minutes.  She has not been taking any medications for diarrhea.  She has been taking Compazine but has had little relief of her nausea.  Medications: I have reviewed the patient's current medications.  Allergies:  Allergies  Allergen Reactions   Chlorhexidine Itching   Promethazine Hcl     REACTION: itchy and red   Diphenhydramine Hcl Itching and Rash    Past Medical History:  Diagnosis Date   Abnormal Pap smear    Anxiety    Breast cancer ([place])    Cancer ([place]) 1999   breast-s/p mastectomy, chemo, rad   CIN I (cervical intraepithelial neoplasia I) 2003   by colpo   Congenital deafness    Deaf    Depression    Port-A-Cath in place 2017   for chemotherapy   Radiation 07/12/15-07/26/15   left femur 30 Gy   S/P bilateral oophorectomy     Past Surgical History:  Procedure Laterality Date   ABDOMINAL HYSTERECTOMY     INTRAMEDULLARY (IM) NAIL INTERTROCHANTERIC Left 06/26/2015   Procedure: LEFT BIOMET LONG AFFIXS NAIL;  Surgeon: [name], MD;  Location: [facility];  Service: Orthopedics;  Laterality: Left;   IR GENERIC HISTORICAL  08/06/2015   IR US GUIDE VASC ACCESS LEFT 08/06/2015 Aletta Edouard, MD [facility]   IR GENERIC HISTORICAL  08/06/2015   IR FLUORO GUIDE CV LINE LEFT 08/06/2015 Aletta Edouard, MD [facility]   IR GENERIC HISTORICAL  03/05/2016   IR CV LINE INJECTION 03/05/2016 Markus Daft, MD [facility]   LEFT HEART CATH AND CORONARY ANGIOGRAPHY N/A 12/13/2017   Procedure: LEFT HEART CATH AND CORONARY ANGIOGRAPHY;  Surgeon: Leonie Man, MD;  Location: [facility];  Service: Cardiovascular;  Laterality: N/A;   MASTECTOMY     right breast   MASTECTOMY     OVARIAN CYST REMOVAL     RADIOLOGY WITH ANESTHESIA Left 06/25/2015   Procedure: MRI OF LEFT HIP WITH OR WITHOUT CONTRAST;  Surgeon: Medication Radiologist, MD;  Location: [facility];  Service: Radiology;  Laterality: Left;  DR. MCINTYRE/MRI   TUBAL  LIGATION      Family History  Problem Relation Age of Onset   Hypertension Mother    Seizures Mother    Alzheimer's disease Maternal Uncle    Pancreatitis Maternal Grandmother    Heart attack Maternal Grandfather     Social History   Socioeconomic History   Marital status: Married    Spouse name: Not on file   Number of children: 2   Years of education: Not on file   Highest education level: Not on file  Occupational History   Not on file  Social Needs   Financial resource strain: Not on file   Food insecurity:    Worry: Not on file    Inability: Not on file   Transportation needs:    Medical: Not on file    Non-medical: Not on file  Tobacco Use   Smoking status: Never Smoker   Smokeless tobacco: Never Used  Substance and Sexual Activity   Alcohol use: No   Drug use: No   Sexual activity:  Yes    Birth control/protection: Surgical  Lifestyle   Physical activity:    Days per week: Not on file    Minutes per session: Not on file   Stress: Not on file  Relationships   Social connections:    Talks on phone: Not on file    Gets together: Not on file    Attends religious service: Not on file    Active member of club or organization: Not on file    Attends meetings of clubs or organizations: Not on file    Relationship status: Not on file   Intimate partner violence:    Fear of current or ex partner: Not on file    Emotionally abused: Not on file    Physically abused: Not on file    Forced sexual activity: Not on file  Other Topics Concern   Not on file  Social History Narrative   Not on file    Past Medical History, Surgical history, Social history, and Family history were reviewed and updated as appropriate.   Please see review of systems for further details on the patient's review from today.   Review of Systems:  Review of Systems  Constitutional: Positive for appetite change. Negative for chills, diaphoresis and fever.  HENT:  Positive for hearing loss. Negative for trouble swallowing and voice change.   Respiratory: Negative for cough, choking, chest tightness, shortness of breath and wheezing.   Cardiovascular: Negative for chest pain and palpitations.  Gastrointestinal: Positive for diarrhea, nausea and vomiting. Negative for abdominal pain and constipation.  Genitourinary: Negative for decreased urine volume.  Musculoskeletal: Negative for back pain and myalgias.  Neurological: Negative for dizziness, light-headedness and headaches.    Objective:   Physical Exam:  BP 115/73 (BP Location: Left Arm, Patient Position: Sitting)    Pulse 75    Temp 98 F (36.7 C) (Oral)    Resp 18    Ht 5\' 5"  (1.651 m)    Wt 128 lb 1.6 oz (58.1 kg)    SpO2 100%    BMI 21.32 kg/m  ECOG: 1  [name] is seen with the use of a sign language interpreter.  Physical Exam Constitutional:      General: She is not in acute distress.    Appearance: She is not diaphoretic.  HENT:     Head: Normocephalic and atraumatic.  Eyes:     General: No scleral icterus.       Right eye: No discharge.        Left eye: No discharge.  Cardiovascular:     Rate and Rhythm: Normal rate and regular rhythm.     Heart sounds: Normal heart sounds. No murmur. No friction rub. No gallop.   Pulmonary:     Effort: Pulmonary effort is normal. No respiratory distress.     Breath sounds: Normal breath sounds. No stridor. No wheezing or rales.  Abdominal:     General: Bowel sounds are normal. There is no distension.     Palpations: Abdomen is soft. There is no mass.     Tenderness: There is no abdominal tenderness. There is no guarding or rebound.  Skin:    General: Skin is warm and dry.  Neurological:     Mental Status: She is alert.     Coordination: Coordination normal.     Gait: Gait normal.     Lab Review:     Component Value Date/Time   NA 144 05/06/2018 0900   NA 141  01/08/2017 0910   K 2.7 (LL) 05/06/2018 0900   K 3.3 (L)  01/08/2017 0910   CL 106 05/06/2018 0900   CL 106 05/31/2012 1055   CO2 25 05/06/2018 0900   CO2 26 01/08/2017 0910   GLUCOSE 89 05/06/2018 0900   GLUCOSE 78 01/08/2017 0910   GLUCOSE 88 05/31/2012 1055   BUN 10 05/06/2018 0900   BUN 10.1 01/08/2017 0910   CREATININE 0.77 05/06/2018 0900   CREATININE 0.80 04/01/2018 0930   CREATININE 0.9 01/08/2017 0910   CALCIUM 9.1 05/06/2018 0900   CALCIUM 9.2 01/08/2017 0910   PROT 6.4 (L) 05/06/2018 0900   PROT 7.1 01/08/2017 0910   ALBUMIN 3.5 05/06/2018 0900   ALBUMIN 3.4 (L) 01/08/2017 0910   AST 17 05/06/2018 0900   AST 20 04/01/2018 0930   AST 21 01/08/2017 0910   ALT 15 05/06/2018 0900   ALT 12 04/01/2018 0930   ALT 17 01/08/2017 0910   ALKPHOS 63 05/06/2018 0900   ALKPHOS 64 01/08/2017 0910   BILITOT 1.5 (H) 05/06/2018 0900   BILITOT 2.0 (H) 04/01/2018 0930   BILITOT 0.95 01/08/2017 0910   GFRNONAA >60 05/06/2018 0900   GFRNONAA >60 04/01/2018 0930   GFRAA >60 05/06/2018 0900   GFRAA >60 04/01/2018 0930       Component Value Date/Time   WBC 2.6 (L) 05/06/2018 0900   RBC 3.64 (L) 05/06/2018 0900   HGB 10.7 (L) 05/06/2018 0900   HGB 10.7 (L) 04/01/2018 0930   HGB 11.9 01/08/2017 0909   HCT 31.8 (L) 05/06/2018 0900   HCT 34.8 01/08/2017 0909   PLT 201 05/06/2018 0900   PLT 238 04/01/2018 0930   PLT 155 01/08/2017 0909   PLT 217 07/24/2014 1604   MCV 87.4 05/06/2018 0900   MCV 85.2 01/08/2017 0909   MCH 29.4 05/06/2018 0900   MCHC 33.6 05/06/2018 0900   RDW 14.7 05/06/2018 0900   RDW 13.5 01/08/2017 0909   LYMPHSABS 1.0 05/06/2018 0900   LYMPHSABS 2.0 01/08/2017 0909   MONOABS 0.3 05/06/2018 0900   MONOABS 0.3 01/08/2017 0909   EOSABS 0.1 05/06/2018 0900   EOSABS 0.1 01/08/2017 0909   EOSABS 0.1 07/24/2014 1604   BASOSABS 0.0 05/06/2018 0900   BASOSABS 0.0 01/08/2017 0909   -------------------------------  Imaging from last 24 hours (if applicable):  Radiology interpretation: Nm Pet Image Restag (ps) Skull  Base To Thigh  Result Date: 04/21/2018 CLINICAL DATA:  Subsequent treatment strategy for right breast cancer. EXAM: NUCLEAR MEDICINE PET SKULL BASE TO THIGH TECHNIQUE: 6.6 mCi F-18 FDG was injected intravenously. Full-ring PET imaging was performed from the skull base to thigh after the radiotracer. CT data was obtained and used for attenuation correction and anatomic localization. Fasting blood glucose: 69 mg/dl COMPARISON:  08/30/2017 FINDINGS: Mediastinal blood pool activity: SUV max 2.5 NECK: No hypermetabolic lymph nodes in the neck. Incidental CT findings: 19 mm right thyroid lesion measured previously is decreased to 9 mm in the interval. CHEST: No hypermetabolic mediastinal or hilar nodes. No suspicious pulmonary nodules on the CT scan. Incidental CT findings: Left Port-A-Cath tip is positioned at the SVC/RA junction. ABDOMEN/PELVIS: No abnormal hypermetabolic activity within the liver, pancreas, adrenal glands, or spleen. No hypermetabolic lymph nodes in the abdomen or pelvis. Incidental CT findings: Gallbladder surgically absent. Low-density interpolar left renal lesion seen on previous study not well demonstrated today. SKELETON: Previously identified hypermetabolic bone disease has decreased in the interval. Right glenoid lesion measured previously at SUV max = 4.7  is now 3.3. Right sacral lesion demonstrating SUV max = 17.7 previously is now 2.4. Posterior left iliac lesion with SUV max = 18.1 previously is now 2.5. Right acetabular lesion with SUV max = 14.8 previously now measures 3.5. A new 8 mm subtle lucent lesion in the right aspect of the L3 vertebral body (128/5) demonstrates SUV max = 4.2. The focal hypermetabolism identified in the spinal colon now at the level of L1 previously is no longer evident. Incidental CT findings: none IMPRESSION: 1. Overall marked interval improvement in bony hypermetabolic disease. All of the previously identified hypermetabolic osseous metastases have decreased in  hypermetabolism in the interval. There is a new tiny focus of hypermetabolism in the right aspect of the L3 vertebral body that appears to correspond to a new 8 mm lucency, raising concern for new metastatic disease. Close attention on follow-up recommended. 2. Focal hypermetabolism in the central spinal canal at the level of L1 previously has resolved. 3. No hypermetabolic soft tissue disease on today's study. 4. Right thyroid nodule seen previously has decreased in the interval. Electronically Signed   By: Misty Stanley M.D.   On: 04/21/2018 14:48    This patient was seen with Dr. Burr Medico with my treatment plan reviewed with her. She expressed agreement with my medical management of this patient.    Addendum  I have seen the patient, examined her. I agree with the assessment and and plan and have edited the notes.   Cindee developed severe N/V, diarrhea and poor oral intake one week after second dose Enhertu, she previously tolerated first dose very well. Her symptoms have improved some today, will get IVF and antiemetics today in clinic. Lab reviewed, K3.0, otherwise unremarkable. Will give IV KCL. She initially refused to continue Enhertu, I encourage her to try with lower dose. She agreed. I will see her back before cycle 3.   Truitt Merle  05/17/2018

## 2018-05-17 NOTE — Progress Notes (Signed)
Pt reports feeling much better at end of infusion.  Tolerated well.  Ate and drank during without any issues.  Denies N/V/D at time of d/c.  Pt/interpreter VU of d/c instructions and to f/u as needed.  PA Lucianne Lei discussed stool sample collection and gave pt materials in case of further diarrhea that warrants a sample, pt/interpreter VU.

## 2018-05-17 NOTE — Patient Instructions (Signed)

## 2018-05-24 ENCOUNTER — Telehealth: Payer: Self-pay | Admitting: *Deleted

## 2018-05-24 ENCOUNTER — Emergency Department (HOSPITAL_COMMUNITY)
Admission: EM | Admit: 2018-05-24 | Discharge: 2018-05-24 | Disposition: A | Discharge status: Home / Self Care | Payer: Medicaid Other | Attending: Emergency Medicine | Admitting: Emergency Medicine

## 2018-05-24 ENCOUNTER — Other Ambulatory Visit: Payer: Self-pay | Admitting: Nurse Practitioner

## 2018-05-24 ENCOUNTER — Emergency Department (HOSPITAL_COMMUNITY): Payer: Medicaid Other

## 2018-05-24 DIAGNOSIS — Z79899 Other long term (current) drug therapy: Secondary | ICD-10-CM | POA: Insufficient documentation

## 2018-05-24 DIAGNOSIS — M545 Low back pain, unspecified: Secondary | ICD-10-CM

## 2018-05-24 DIAGNOSIS — R112 Nausea with vomiting, unspecified: Secondary | ICD-10-CM | POA: Diagnosis not present

## 2018-05-24 LAB — URINALYSIS, ROUTINE W REFLEX MICROSCOPIC
Bilirubin Urine: NEGATIVE
Glucose, UA: NEGATIVE mg/dL
Ketones, ur: NEGATIVE mg/dL
Leukocytes,Ua: NEGATIVE
Nitrite: NEGATIVE
Protein, ur: NEGATIVE mg/dL
Specific Gravity, Urine: 1.021 (ref 1.005–1.030)
pH: 5 (ref 5.0–8.0)

## 2018-05-24 LAB — BASIC METABOLIC PANEL
Anion gap: 9 (ref 5–15)
BUN: 6 mg/dL (ref 6–20)
CO2: 27 mmol/L (ref 22–32)
Calcium: 8.9 mg/dL (ref 8.9–10.3)
Chloride: 105 mmol/L (ref 98–111)
Creatinine, Ser: 0.72 mg/dL (ref 0.44–1.00)
GFR calc Af Amer: >60 mL/min (ref >60)
GFR calc non Af Amer: >60 mL/min (ref >60)
Glucose, Bld: 98 mg/dL (ref 70–99)
Potassium: 2.7 mmol/L — CL (ref 3.5–5.1)
Sodium: 141 mmol/L (ref 135–145)

## 2018-05-24 LAB — CBC WITH DIFFERENTIAL/PLATELET
Abs Immature Granulocytes: 0.02 10*3/uL (ref 0.00–0.07)
Basophils Absolute: 0 10*3/uL (ref 0.0–0.1)
Basophils Relative: 1 %
Eosinophils Absolute: 0.1 10*3/uL (ref 0.0–0.5)
Eosinophils Relative: 2 %
HCT: 35.4 % — ABNORMAL LOW (ref 36.0–46.0)
Hemoglobin: 11.7 g/dL — ABNORMAL LOW (ref 12.0–15.0)
Immature Granulocytes: 1 %
Lymphocytes Relative: 31 %
Lymphs Abs: 1.1 10*3/uL (ref 0.7–4.0)
MCH: 29.3 pg (ref 26.0–34.0)
MCHC: 33.1 g/dL (ref 30.0–36.0)
MCV: 88.7 fL (ref 80.0–100.0)
Monocytes Absolute: 0.4 10*3/uL (ref 0.1–1.0)
Monocytes Relative: 11 %
Neutro Abs: 1.9 10*3/uL (ref 1.7–7.7)
Neutrophils Relative %: 54 %
Platelets: 240 10*3/uL (ref 150–400)
RBC: 3.99 MIL/uL (ref 3.87–5.11)
RDW: 14.9 % (ref 11.5–15.5)
WBC: 3.5 10*3/uL — ABNORMAL LOW (ref 4.0–10.5)
nRBC: 0 % (ref 0.0–0.2)

## 2018-05-24 MED ORDER — LIDOCAINE 5 % EX PTCH: 1.0000 | MEDICATED_PATCH | CUTANEOUS | 0 refills | Status: DC

## 2018-05-24 MED ORDER — ONDANSETRON HCL 4 MG/2ML IJ SOLN
4.0000 mg | Freq: Once | INTRAMUSCULAR | Status: AC
  Administered 2018-05-24: 4 mg via INTRAVENOUS
  Filled 2018-05-24: qty 2

## 2018-05-24 MED ORDER — HEPARIN SOD (PORK) LOCK FLUSH 100 UNIT/ML IV SOLN
500.0000 [IU] | INTRAVENOUS | Status: AC | PRN
  Administered 2018-05-24: 23:00:00 500 [IU]

## 2018-05-24 MED ORDER — OXYCODONE HCL 5 MG PO TABS: 10.0000 mg | ORAL_TABLET | Freq: Four times a day (QID) | ORAL | 0 refills | Status: DC | PRN

## 2018-05-24 MED ORDER — METHOCARBAMOL 500 MG PO TABS: 500.0000 mg | ORAL_TABLET | Freq: Two times a day (BID) | ORAL | 0 refills | Status: DC

## 2018-05-24 MED ORDER — DICLOFENAC SODIUM 1 % TD GEL: 4.0000 g | Freq: Four times a day (QID) | TRANSDERMAL | 0 refills | Status: DC

## 2018-05-24 MED ORDER — ONDANSETRON HCL 8 MG PO TABS: 8.0000 mg | ORAL_TABLET | Freq: Three times a day (TID) | ORAL | 1 refills | Status: DC | PRN

## 2018-05-24 MED ORDER — SODIUM CHLORIDE 0.9% FLUSH: 10.0000 mL | INTRAVENOUS | Status: DC | PRN

## 2018-05-24 MED ORDER — HYDROMORPHONE HCL 1 MG/ML IJ SOLN
1.0000 mg | Freq: Once | INTRAMUSCULAR | Status: AC
  Administered 2018-05-24: 1 mg via INTRAVENOUS
  Filled 2018-05-24: qty 1

## 2018-05-24 MED ORDER — HYDROMORPHONE HCL 1 MG/ML IJ SOLN
1.0000 mg | Freq: Once | INTRAMUSCULAR | Status: AC
  Administered 2018-05-24: 21:00:00 1 mg via INTRAVENOUS
  Filled 2018-05-24: qty 1

## 2018-05-24 NOTE — ED Provider Notes (Signed)
[facility] EMERGENCY DEPARTMENT Provider Note   CSN: [id number] Arrival date & time: 05/24/18  1635    History   Chief Complaint Chief Complaint  Patient presents with  . Back Pain    HPI [name] is a 53 y.o. female.     Patient is a 53 year old female who has a history of metastatic breast cancer and congenital deafness who presents with nausea and vomiting as well as back pain.  She is currently undergoing chemotherapy for metastatic breast cancer.  She has been having worsening pain to her lower back for about 3 days.  She has had some associated nausea and vomiting.  The pain is worse when she moves.  It is nonradiating.  She denies any weakness in her leg.  She has some intermittent numbness in her left foot but this is baseline for her.  She has no fevers.  No abdominal pain.  No urinary symptoms.  Her last chemotherapy was 3 weeks ago.  History was obtained through a sign language interpreter.     Past Medical History:  Diagnosis Date  . Abnormal Pap smear   . Anxiety   . Breast cancer ([place])   . Cancer ([place]) 1999   breast-s/p mastectomy, chemo, rad  . CIN I (cervical intraepithelial neoplasia I) 2003   by colpo  . Congenital deafness   . Deaf   . Depression   . Port-A-Cath in place 2017   for chemotherapy  . Radiation 07/12/15-07/26/15   left femur 30 Gy  . S/P bilateral oophorectomy     Patient Active Problem List   Diagnosis Date Noted  . Chest pain 12/18/2017  . Atypical angina ([place]) 12/13/2017  . Abnormal stress echocardiogram 12/08/2017  . Bilateral thigh pain 11/19/2016  . Goals of care, counseling/discussion 03/05/2016  . MRSA (methicillin resistant Staphylococcus aureus) infection 01/18/2016  . Anemia due to antineoplastic chemotherapy 12/11/2015  . Lymphedema of left leg 12/11/2015  . Osteopenia of multiple sites 11/26/2015  . Chemotherapy-induced peripheral neuropathy ([place]) 11/03/2015  . Port catheter in place  09/26/2015  . Chemotherapy induced nausea and vomiting 08/13/2015  . Cancer related pain 08/13/2015  . [name], right ([place]) 07/14/2015  . S/P TRAM (transverse rectus abdominis muscle) flap breast reconstruction 07/14/2015  . Thyroid nodule 07/09/2015  . Depression   . Generalized anxiety disorder   . Esophageal reflux   . Genetic testing 04/04/2015  . Congenital deafness 02/26/2015  . Premature surgical menopause 02/26/2015  . Benign paroxysmal positional vertigo 07/24/2014  . Migraine 07/24/2014  . DIZZINESS, CHRONIC 01/25/2007  . Insomnia 01/25/2007    Past Surgical History:  Procedure Laterality Date  . ABDOMINAL HYSTERECTOMY    . INTRAMEDULLARY (IM) NAIL INTERTROCHANTERIC Left 06/26/2015   Procedure: LEFT BIOMET LONG AFFIXS NAIL;  Surgeon: [name], MD;  Location: [facility];  Service: Orthopedics;  Laterality: Left;  . IR GENERIC HISTORICAL  08/06/2015   IR US GUIDE VASC ACCESS LEFT 08/06/2015 Aletta Edouard, MD [facility]  . IR GENERIC HISTORICAL  08/06/2015   IR FLUORO GUIDE CV LINE LEFT 08/06/2015 Aletta Edouard, MD [facility]  . IR GENERIC HISTORICAL  03/05/2016   IR CV LINE INJECTION 03/05/2016 Markus Daft, MD [facility]  . LEFT HEART CATH AND CORONARY ANGIOGRAPHY N/A 12/13/2017   Procedure: LEFT HEART CATH AND CORONARY ANGIOGRAPHY;  Surgeon: Leonie Man, MD;  Location: [facility];  Service: Cardiovascular;  Laterality: N/A;  . MASTECTOMY  right breast  . MASTECTOMY    . OVARIAN CYST REMOVAL    . RADIOLOGY WITH ANESTHESIA Left 06/25/2015   Procedure: MRI OF LEFT HIP WITH OR WITHOUT CONTRAST;  Surgeon: Medication Radiologist, MD;  Location: [facility];  Service: Radiology;  Laterality: Left;  DR. MCINTYRE/MRI  . TUBAL LIGATION       OB History    Gravida  2   Para  2   Term  1   Preterm  1   AB  0   Living  2     SAB  0   TAB  0   Ectopic  0   Multiple  0   Live Births               Home Medications     Prior to Admission medications   Medication Sig Start Date End Date Taking? Authorizing Provider  albuterol (PROVENTIL HFA;VENTOLIN HFA) 108 (90 Base) MCG/ACT inhaler Inhale 2 puffs into the lungs every 6 (six) hours as needed for wheezing or shortness of breath. 02/11/18   Truitt Merle, MD  ALPRAZolam Duanne Moron) 0.25 MG tablet Take 1 tablet (0.25 mg total) by mouth 2 (two) times daily as needed for anxiety. 04/15/18   Truitt Merle, MD  cyclobenzaprine (FLEXERIL) 5 MG tablet Take 1 tablet (5 mg total) by mouth 3 (three) times daily as needed for muscle spasms. 12/17/17   Truitt Merle, MD  diphenoxylate-atropine (LOMOTIL) 2.5-0.025 MG tablet Take 1-2 tablets by mouth 4 (four) times daily as needed for diarrhea or loose stools. 02/11/18   Truitt Merle, MD  HYDROcodone-acetaminophen (NORCO) 10-325 MG tablet Take 1 tablet by mouth every 6 (six) hours as needed. 11/24/17   Truitt Merle, MD  lidocaine-prilocaine (EMLA) cream Apply 1 application topically as needed. Apply to Porta-Cath 1-2 hours prior to access as directed. 03/18/18   Truitt Merle, MD  omeprazole (PRILOSEC) 20 MG capsule Take 1 capsule (20 mg total) by mouth daily. 04/01/18   Truitt Merle, MD  ondansetron (ZOFRAN) 8 MG tablet Take 1 tablet (8 mg total) by mouth every 8 (eight) hours as needed for nausea or vomiting. 05/24/18   Alla Feeling, NP  oxyCODONE (OXY IR/ROXICODONE) 5 MG immediate release tablet Take 1 tablet (5 mg total) by mouth every 6 (six) hours as needed for severe pain. 03/25/18   Tanner, Lyndon Code., [name]  potassium chloride (K-DUR) 10 MEQ tablet Take 2 tablets (20 mEq total) by mouth 2 (two) times daily. 04/01/18   Truitt Merle, MD  prochlorperazine (COMPAZINE) 10 MG tablet Take 1 tablet (10 mg total) by mouth every 6 (six) hours as needed for nausea or vomiting. 02/25/18   Truitt Merle, MD  zolpidem (AMBIEN) 5 MG tablet Take 1 tablet (5 mg total) by mouth at bedtime as needed for sleep. 04/15/18   Truitt Merle, MD    Family History Family History  Problem Relation  Age of Onset  . Hypertension Mother   . Seizures Mother   . Alzheimer's disease Maternal Uncle   . Pancreatitis Maternal Grandmother   . Heart attack Maternal Grandfather     Social History Social History   Tobacco Use  . Smoking status: Never Smoker  . Smokeless tobacco: Never Used  Substance Use Topics  . Alcohol use: No  . Drug use: No     Allergies   Chlorhexidine; Promethazine hcl; and Diphenhydramine hcl   Review of Systems Review of Systems  Constitutional: Negative for chills, diaphoresis, fatigue and fever.  HENT: Negative for congestion, rhinorrhea and sneezing.   Eyes: Negative.   Respiratory: Negative for cough, chest tightness and shortness of breath.   Cardiovascular: Negative for chest pain and leg swelling.  Gastrointestinal: Positive for nausea and vomiting. Negative for abdominal pain, blood in stool and diarrhea.  Genitourinary: Negative for difficulty urinating, flank pain, frequency and hematuria.  Musculoskeletal: Positive for back pain. Negative for arthralgias.  Skin: Negative for rash.  Neurological: Negative for dizziness, speech difficulty, weakness, numbness and headaches.     Physical Exam Updated Vital Signs BP 118/72   Pulse 72   Temp 98 F (36.7 C) (Oral)   Resp 18   SpO2 99%   Physical Exam Constitutional:      Appearance: She is well-developed.  HENT:     Head: Normocephalic and atraumatic.  Eyes:     Pupils: Pupils are equal, round, and reactive to light.  Neck:     Musculoskeletal: Normal range of motion and neck supple.  Cardiovascular:     Rate and Rhythm: Normal rate and regular rhythm.     Heart sounds: Normal heart sounds.  Pulmonary:     Effort: Pulmonary effort is normal. No respiratory distress.     Breath sounds: Normal breath sounds. No wheezing or rales.  Chest:     Chest wall: No tenderness.  Abdominal:     General: Bowel sounds are normal.     Palpations: Abdomen is soft.     Tenderness: There is no  abdominal tenderness. There is no guarding or rebound.  Musculoskeletal: Normal range of motion.     Comments: Patient has tenderness along her lower lumbosacral spine and along the musculature in the lumbar spine area on the left side.  No step-offs or deformities.  She has normal sensation and motor function to lower extremities.  Lymphadenopathy:     Cervical: No cervical adenopathy.  Skin:    General: Skin is warm and dry.     Findings: No rash.  Neurological:     Mental Status: She is alert and oriented to person, place, and time.      ED Treatments / Results  Labs (all labs ordered are listed, but only abnormal results are displayed) Labs Reviewed  BASIC METABOLIC PANEL  CBC WITH DIFFERENTIAL/PLATELET  URINALYSIS, ROUTINE W REFLEX MICROSCOPIC    EKG None  Radiology No results found.  Procedures Procedures (including critical care time)  Medications Ordered in ED Medications  sodium chloride flush (NS) 0.9 % injection 10-40 mL (has no administration in time range)  HYDROmorphone (DILAUDID) injection 1 mg (1 mg Intravenous Given 05/24/18 1831)  ondansetron (ZOFRAN) injection 4 mg (4 mg Intravenous Given 05/24/18 1831)     Initial Impression / Assessment and Plan / ED Course  I have reviewed the triage vital signs and the nursing notes.  Pertinent labs & imaging results that were available during my care of the patient were reviewed by me and considered in my medical decision making (see chart for details).        Patient with metastatic breast cancer presents with low back pain associated nausea and vomiting.  She has no neurologic deficits.  She is awaiting imaging studies and labs.  She was given pain medication and antiemetics.  Will need reassessment following her results.  Arlean Hopping, [name] to follow up on these and reassess pt.  Final Clinical Impressions(s) / ED Diagnoses   Final diagnoses:  None    ED Discharge Orders    None  [name],  MD 05/24/18 [phone]

## 2018-05-24 NOTE — Telephone Encounter (Signed)
Attempted to call pt back without answer.  Left message on voice mail asking pt to call nurse back.

## 2018-05-24 NOTE — Telephone Encounter (Signed)
I will call in prescription, she does not have on her list.  Regan Rakers NP

## 2018-05-24 NOTE — Telephone Encounter (Signed)
If she prefers to be evaluated in person, let her come in to see Lacie or Lucianne Lei today or tomorrow, otherwise encourage her to use zofram or ativan as needed for nausea, in addition to compazine, OK to increase oxycodone dose or frequency for pain control.   Truitt Merle MD

## 2018-05-24 NOTE — ED Provider Notes (Signed)
[name] is a 53 y.o. female, presenting to the ED with back pain beginning 3 days ago.  Denies fever/chills, changes in bowel or bladder function, acute sensation abnormalities, weakness, saddle anesthesias, abdominal pain, falls/trauma.  Bedside sign language interpreter was utilized.  HPI from Debra Johns, MD: "Patient is a 53 year old female who has a history of metastatic breast cancer and congenital deafness who presents with nausea and vomiting as well as back pain.  She is currently undergoing chemotherapy for metastatic breast cancer.  She has been having worsening pain to her lower back for about 3 days.  She has had some associated nausea and vomiting.  The pain is worse when she moves.  It is nonradiating.  She denies any weakness in her leg.  She has some intermittent numbness in her left foot but this is baseline for her.  She has no fevers.  No abdominal pain.  No urinary symptoms.  Her last chemotherapy was 3 weeks ago.  History was obtained through a sign language interpreter."  Past Medical History:  Diagnosis Date   Abnormal Pap smear    Anxiety    Breast cancer ([name])    Cancer ([place]) 1999   breast-s/p mastectomy, chemo, rad   CIN I (cervical intraepithelial neoplasia I) 2003   by colpo   Congenital deafness    Deaf    Depression    Port-A-Cath in place 2017   for chemotherapy   Radiation 07/12/15-07/26/15   left femur 30 Gy   S/P bilateral oophorectomy     Physical Exam  BP 118/72    Pulse 72    Temp 98 F (36.7 C) (Oral)    Resp 18    SpO2 99%   Physical Exam Vitals signs and nursing note reviewed.  Constitutional:      General: She is not in acute distress.    Appearance: She is well-developed. She is not diaphoretic.     Comments: Chronically ill-appearing  HENT:     Head: Normocephalic and atraumatic.     Mouth/Throat:     Mouth: Mucous membranes are moist.     Pharynx: Oropharynx is clear.  Eyes:     Conjunctiva/sclera:  Conjunctivae normal.  Neck:     Musculoskeletal: Neck supple.  Cardiovascular:     Rate and Rhythm: Normal rate and regular rhythm.     Pulses: Normal pulses.          Posterior tibial pulses are 2+ on the right side and 2+ on the left side.     Comments: Tactile temperature in the extremities appropriate and equal bilaterally. Pulmonary:     Effort: Pulmonary effort is normal. No respiratory distress.  Abdominal:     Palpations: Abdomen is soft.     Tenderness: There is no abdominal tenderness. There is no guarding.  Musculoskeletal:       Back:     Right lower leg: No edema.     Left lower leg: No edema.     Comments: Tenderness in the region indicated in the left lower back without swelling, deformity, color change, or instability.  No tenderness along the left iliac crest, left lateral hip, posterior hip, or anterior hip. Range of motion in the hip without pain or difficulty beyond what patient indicates is her baseline from previous surgery on the hip and femur.  Normal motor function intact in all extremities. No midline spinal tenderness.   Lymphadenopathy:     Cervical: No cervical adenopathy.  Skin:  General: Skin is warm and dry.  Neurological:     Mental Status: She is alert.     Comments: Sensation grossly intact to light touch in the lower extremities bilaterally. No saddle anesthesias. Strength 5/5 in the bilateral lower extremities. Patient ambulated with a slow, antalgic gait, but without unsteadiness or need for assistance.  Psychiatric:        Mood and Affect: Mood and affect normal.        Speech: Speech normal.        Behavior: Behavior normal.     ED Course/Procedures    Procedures  Abnormal Labs Reviewed  BASIC METABOLIC PANEL - Abnormal; Notable for the following components:      Result Value   Potassium 2.7 (*)    All other components within normal limits  CBC WITH DIFFERENTIAL/PLATELET - Abnormal; Notable for the following components:   WBC  3.5 (*)    Hemoglobin 11.7 (*)    HCT 35.4 (*)    All other components within normal limits  URINALYSIS, ROUTINE W REFLEX MICROSCOPIC - Abnormal; Notable for the following components:   Hgb urine dipstick SMALL (*)    Bacteria, UA RARE (*)    All other components within normal limits   WBC  Date Value Ref Range Status  05/24/2018 3.5 (L) 4.0 - 10.5 K/uL Final  05/06/2018 2.6 (L) 4.0 - 10.5 K/uL Final  04/22/2018 4.2 4.0 - 10.5 K/uL Final  04/15/2018 3.1 (L) 4.0 - 10.5 K/uL Final   WBC Count  Date Value Ref Range Status  05/17/2018 3.7 (L) 4.0 - 10.5 K/uL Final    Dg Lumbar Spine Complete  Result Date: 05/24/2018 CLINICAL DATA:  Lumbago.  History of breast carcinoma EXAM: LUMBAR SPINE - COMPLETE 4+ VIEW COMPARISON:  Lumbar radiographs March 01, 2016 FINDINGS: Frontal, lateral, spot lumbosacral lateral, and bilateral oblique views were obtained. There are 4 non-rib-bearing lumbar type vertebral bodies. There is no fracture or spondylolisthesis. There is slight disc space narrowing at L4-S1. Other disc spaces appear unremarkable. There is facet osteoarthritic change at L4-S1 bilaterally. No blastic or lytic bone lesions are demonstrable by radiography. IMPRESSION: Osteoarthritic change at L4-S1. Other disc spaces appear normal. No blastic or lytic bone lesions are demonstrable. No fracture or spondylolisthesis. Electronically Signed   By: Lowella Grip III M.D.   On: 05/24/2018 20:40     MDM   Clinical Course as of May 24 2339  Tue May 24, 2018  2040 Previous instances in this range.  Patient states low potassium is not a new issue for her.  Potassium(!!): 2.7 [SJ]    Clinical Course User Index [SJ] [name], [name]   Patient care handoff report received from Debra Johns, MD. Plan: Labs and lumbar spine x-ray pending.  Patient has acute back pain that developed 3 days ago.  No evidence of neurovascular compromise.  There were no acute abnormalities on the lumbar spine  x-ray.  Due to the patient's persistent pain, further imaging was suggested with explanation. I spent a significant amount of time in the room with the patient, her husband, and the sign language interpreter explaining lab results, imaging findings, and options for next steps. Patient has a follow-up appointment with Dr. Burr Medico on May 15.  She stated she would prefer to wait on any further imaging until she met with Dr. Burr Medico at that appointment.  I have made it explicitly clear to the patient that we still do not know what exactly is causing  her pain and osseous abnormalities are possible despite the lack of acute findings on x-ray, especially in light of the patient's active cancer. Patient and her husband voiced understanding of this information. I offered a variety of options for pain management. My suspicion for neurosurgical emergency, such as cauda equina, is low based on patient's symptoms and physical exam is not suggestive.  The patient was given instructions for home care as well as strict return precautions. Patient voices understanding of these instructions, accepts the plan, and is comfortable with discharge.  The case, options, and final plan were discussed with Dr. Sherry Ruffing after EDP shift change.   In chart review I noticed a note placed today by patient's oncologist, Dr. Burr Medico, who stated patient could increase dosing and/or frequency of oxycodone.  As patient does not have any oxycodone remaining, I thought it reasonable to write an additional narcotic prescription for this cancer patient in acute pain reflecting the recommendation for dose increase made by Dr. Burr Medico.    Vitals:   05/24/18 1800 05/24/18 1930 05/24/18 2000 05/24/18 2130  BP: 118/72 136/87 133/80   Pulse: 72 77  74  Resp: 18 17 15  (!) 22  Temp:      TempSrc:      SpO2: 99% 98%  96%      [name], [name] 05/25/18 0001    Debra Johns, MD 05/25/18 1104

## 2018-05-24 NOTE — Telephone Encounter (Signed)
Spoke with pt.  Pt was upset stating that no one called her back,  Informed pt that nurse did call pt; no answer.  Nurse left message on voice mail requesting pt to call office back.   Informed pt of Dr. Ernestina Penna instructions; however, pt interrupted stating that " You all don't undertand.  I am in pain, I am just going to the ER . " Offered pt to have appt tomorrow with either [name] provider or Regan Rakers, NP.  Pt stated she was going to ER now.

## 2018-05-24 NOTE — Telephone Encounter (Signed)
Spoke with pt and was informed that she has new pain in lower back at sacral area for about 3 days.  Took Oxycodone without relief.  Stated also has nausea - still able to eat and drink ok;  Can keep foods down, but vomited water back up.  Stated she took Compazine every 6 hours as prescribed without much relef. Pt wanted to know what she should do - go to ER ? Informed pt that Dr. Burr Medico is not in office today.  Regan Rakers, NP will be notified.  Pt will be contacted with further instructions. Pt's    Phone    [phone].

## 2018-05-24 NOTE — Discharge Instructions (Addendum)
°  Oxycodone Your previous oxycodone prescription was 5 mg (1 tablet) every 6 hours. If the 5 mg oxycodone is not strong enough to relieve much of your pain, may begin taking 10 mg (2 tablets) every 6 hours. If the problem is that the pain relief is not lasting long enough, may try 5 mg (1 tablet) every 4 hours. Methocarbamol: Methocarbamol (generic for Robaxin) is a muscle relaxer and can help relieve stiff muscles or muscle spasms.  Do not drive or perform other dangerous activities while taking the Robaxin. This medication is meant to be used instead of the cyclobenzaprine (Flexeril).  Do not use these 2 medications together. Diclofenac gel: This is a topical anti-inflammatory medication and can be applied directly to the painful region.  Do not use on the face or genitals.  This medication may be used as an alternative to oral anti-inflammatory medications, such as ibuprofen or naproxen.  It is important to note that we still do not know what is causing your pain.  We have discussed and agreed to defer any further imaging until after you confer with Dr. Burr Medico.   Return to [facility] ED for worsening pain, fever, addition of abdominal pain, changes in bowel or bladder function, numbness, weakness, or any other major concerns.

## 2018-05-24 NOTE — ED Triage Notes (Signed)
Pt arrived via pov from home c/o back pain x 3 days. Pt is a cancer pt w/ port in place. Pt requires ASL interpretation. No other complaints at this time. Pt is alert and oriented x4. Husband at bedside for translation.

## 2018-05-24 NOTE — ED Notes (Signed)
Called sign language interpreter

## 2018-05-24 NOTE — ED Notes (Signed)
ED Provider at bedside. 

## 2018-05-26 ENCOUNTER — Other Ambulatory Visit: Payer: Self-pay

## 2018-05-26 DIAGNOSIS — C7951 Secondary malignant neoplasm of bone: Secondary | ICD-10-CM

## 2018-05-26 DIAGNOSIS — C50911 Malignant neoplasm of unspecified site of right female breast: Secondary | ICD-10-CM

## 2018-05-26 NOTE — Progress Notes (Signed)
[place]   [phone] [phone]   Clinic Follow up Note   Patient Care Team: [name], [name], [name] as PCP - General (Physician Assistant) [name], MD as Consulting Physician (Orthopedic Surgery)  Date of Service:  05/27/2018  CHIEF COMPLAINT: F/u ofmetastatic breast cancer  SUMMARY OF ONCOLOGIC HISTORY: Oncology History    Cancer Staging No matching staging information was found for the patient.       [name], right ([place])   11/1997 Cancer Diagnosis    Patient had 1.4 cm poorly differentiated right breast carcinoma diagnosed in Nov 1999 at age 18, 1/7 axillary nodes involved, ER/PR and HER 2 positive. She had mastectomy with the 7 axillary node evaluation, 4 cycles of adriamycin/ cytoxan followed by taxotere, then five years of tamoxifen thru June 2005 (no herceptin in 1999). She had bilateral oophorectomy in May 2005. She was briefly on aromatase inhibitor after tamoxifen, but discontinued this herself due to poor tolerance.    04/04/2015 Genetic Testing    Negative genetic testing on the breast/ovarian cancer pnael.  The Breast/Ovarian gene panel offered by [facility] includes sequencing and rearrangement analysis for the following 20 genes:  ATM, BARD1, BRCA1, BRCA2, BRIP1, CDH1, CHEK2, EPCAM, FANCC, MLH1, MSH2, MSH6, NBN, PALB2, PMS2, PTEN, RAD51C, RAD51D, TP53, and XRCC2.    06/26/2015 Pathology Results    Initial Path Report Bone, curettage, Left femur met, intramedullary subtroch tissue - METASTATIC ADENOCARCINOMA.  Estrogen Receptor: 95%, POSITIVE, STRONG STAINING INTENSITY Progesterone Receptor: 2%, POSITIVE, STRONG  HER2 (+), IHC 3+    06/26/2015 Surgery    Biopsy of metastatic tissue and stabilization with Affixus trochanteric nail, proximal and distal interlock of left femur by Dr. [name]     06/28/2015 Imaging    CT Chest w/ Contrast 1. Extensive right internal mammary chain  lymphadenopathy consistent with metastatic breast cancer. 2. Lytic metastases involving the posterior elements at T1 with probable nondisplaced pathologic fracture of the spinous process. 3. No other evidence of thoracic metastatic disease. 4. Trace bilateral pleural effusions with associated bibasilar atelectasis. 5. Indeterminate right thyroid nodule, not previously imaged. This could be evaluated with thyroid ultrasound as clinically warranted.    07/01/2015 Imaging    Bone Scan 1. Increased activity noted throughout the left femur. Although metastatic disease cannot be excluded, these changes are most likely secondary to prior surgery. 2. No other focal abnormalities identified to suggest metastatic disease.    07/12/2015 - 07/26/2015 Radiation Therapy    Left femur, 30 Gy in 10 fractions by Dr. Sondra Come     07/14/2015 Initial Diagnosis    [name], right ([place])    07/19/2015 Imaging    Initial PET Scan 1. Severe multifocal osseous metastatic disease, much greater than anticipated based on prior imaging. 2. Multifocal nodal metastases involving the right internal mammary, prevascular and subpectoral lymph nodes. No axillary pulmonary involvement identified. 3. No distant extra osseous metastases.    07/30/2015 - 11/29/2015 Chemotherapy    Docetaxel, Herceptin and Perjeta every 3 weeks X6 cycles, pt tolerated moderately well, restaging scan showed excellent response     09/18/2015 Imaging    CT Head w/wo Contrast 1. No acute intracranial abnormality or significant interval change. 2. Stable minimal periventricular white matter hypoattenuation on the right. This may reflect a remote ischemic injury. 3. No evidence for metastatic disease to the brain. 4. No focal soft tissue lesion to explain the patient's tenderness.  10/30/2015 Imaging    CT Abdomen Pelvis w/ Contrast 1. Few mildly prominent fluid-filled loops of small bowel scattered throughout the abdomen,  with intraluminal fluid density within the distal colon. Given the provided history, findings are suggestive of acute enteritis/diarrheal illness. 2. No other acute intra-abdominal or pelvic process identified. 3. Widespread osseous metastatic disease, better evaluated on most recent PET-CT from 07/19/2015. No pathologic fracture or other complication. 4. 11 mm hypodensity within the left kidney. This lesion measures intermediate density, and is indeterminate. While this lesion is similar in size relative to recent studies, this is increased in size relative to prior study from 2012. Further evaluation with dedicated renal mass protocol CT and/or MRI is recommended for complete characterization.    12/2015 - 05/14/2016 Chemotherapy    Maintenance Herceptin and pejeta every 3 weeks stopped on 05/14/16 due to disease progression    12/25/2015 Imaging    Restaging PET Scan 1. Markedly improved skeletal activity, with only several faint foci of residual accentuated metabolic activity, but resolution of the vast majority of the previously extensive osseous metastatic disease. 2. Resolution of the prior right internal mammary and right prevascular lymph nodes. 3. Residual subcutaneous edema and edema along fascia planes in the left upper thigh. This remains somewhat more than I would expect for placement of an IM nail 6 weeks ago. If there is leg swelling further down, consider Doppler venous ultrasound to rule out left lower extremity DVT. I do not perceive an obvious difference in density between the pelvic and common femoral veins on the noncontrast CT data. 4. Chronic right maxillary and right sphenoid sinusitis.    01/16/2016 - 10/20/2017 Anti-estrogen oral therapy    Letrozole 2.5 mg daily. She was switched to exemestane due to disease progression on 02/08/17. Stopped on 10/20/2017 when treatment changed to chemo     03/01/2016 Miscellaneous    Patient presented to ED following a fall; she reports she  landed on her back and is now experiencing back pain. The patient was evaluated and discharge home same day with pain control.    05/01/2016 Imaging    CT CAP IMPRESSION: 1. Stable exam.  No new or progressive disease identified. 2. No evidence for residual or recurrent adenopathy within the chest. 3. Stable bone metastasis.    05/01/2016 Imaging    BONE SCAN IMPRESSION: 1. Small focal uptake involving the anterior rib ends of the left second and right fourth ribs, new since the prior bone scan. The location of this uptake is more suggestive of a traumatic or inflammatory etiology as opposed to metastatic disease. No other evidence of metastatic disease. 2. There are other areas of stable uptake which are most likely degenerative/reactive, including right shoulder and cervical spine uptake and uptake along the right femur adjacent to the ORIF hardware.    05/20/2016 Imaging    NM PET Skull to Thigh IMPRESSION: 1. There multiple metastatic foci in the bony pelvis, including some new abnormal foci compare to the prior PET-CT, compatible with mildly progressive bony metastatic disease. Also, a left T11 vertebral hypermetabolic metastatic lesion is observed, new compared to the prior exam. 2. No active extraosseous malignancy is currently identified. 3. Right anterior fourth rib healing fracture, appears be    06/04/2016 - 01/08/2017 Chemotherapy    Second line chemotherapy Kadcyla every 3 weeks started on 06/04/16 and stopped on 01/08/17 due to mixed response.      10/05/2016 Imaging    CT CAP and Bone Scan:  Bones: left  intramedullary rod and left femoral head neck screw in place. In the proximal diaphysis of the left femur there is cortical thickening and irregularity. No lytic or blastic bone lesions. No fracture or vertebral endplate destruction.   No definite evidence of recurrent disease in the chest, abdomen, or pelvis.    10/28/2016 Imaging    CT A/P IMPRESSION: No acute  process demonstrated in the abdomen or pelvis. No evidence of bowel obstruction or inflammation.    11/04/2016 Imaging    DG foot complete left: IMPRESSION: No fracture or dislocation.  No soft tissue abnormality    11/23/2016 Imaging    CT RIGHT FEMUR IMPRESSION: Negative CT scan of the right thigh.  No visible metastatic disease.  CT LEFT FEMUR IMPRESSION: 1. Postsurgical changes related to prior cephalomedullary rod fixation of the left femur with linear lucency along the anterolateral proximal femoral cortex at level of the lesser trochanter, suspicious for nondisplaced fracture. 2. Slightly permeative appearance of the proximal left femoral cortex at the site of known prior osseous metastases is largely unchanged. 3. Unchanged patchy lucency and sclerosis along the anterior acetabulum, which appears to correspond with an area of faint uptake on the prior PET-CT, suspicious for osseous metastasis. These results will be called to the ordering clinician or representative by the Radiologist Assistant, and communication documented in the PACS or zVision Dashboard.      01/27/2017 Imaging    IMPRESSION: 1. Mixed response to osseous metastasis. Some hypermetabolic lesions are new and increasingly hypermetabolic, while another index lesion is decreasingly hypermetabolic. 2. No evidence of hypermetabolic soft tissue metastasis.     02/08/2017 - 10/19/2017 Antibody Plan    -Herceptin every 3 week since 02/08/17 -Oral Lanpantinib '1500mg'$  and decreased to '500mg'$  due to diarrhea and depression. Due to disease progression she swithced to Verzenio '150mg'$  on 05/04/17. She did not toelrate well so she was switched to Ibrance '100mg'$  on 05/31/17. Due to her neutropenia, Ibrance dose reduced to 75 mg daily, 3 weeks on, one-week off, stopped on 10/20/2017 due to disease progression.     04/30/2017 PET scan    IMPRESSION: 1. Primarily increase in hypermetabolism of osseous metastasis. An isolated  right acetabular lesion is decreased in hypermetabolism since the prior exam. 2. No evidence of hypermetabolic soft tissue metastasis. 3. Similar non FDG avid right-sided thyroid nodule, favoring a benign etiology. Recommend attention on follow-up.    08/30/2017 PET scan    08/30/2017 PET Scan  IMPRESSION: 1. Worsening osseous metastatic disease. 2. Focal hypermetabolism in the central spinal canal the L1 level, similar to the prior exam. Metastatic implant cannot be excluded. 3. Slight enlargement of a right thyroid nodule which now appears more cystic in character. Consider further evaluation with thyroid ultrasound. If patient is clinically hyperthyroid, consider nuclear medicine thyroid uptake and scan    08/30/2017 Progression    08/30/2017 PET Scan  IMPRESSION: 1. Worsening osseous metastatic disease. 2. Focal hypermetabolism in the central spinal canal the L1 level, similar to the prior exam. Metastatic implant cannot be excluded. 3. Slight enlargement of a right thyroid nodule which now appears more cystic in character. Consider further evaluation with thyroid ultrasound. If patient is clinically hyperthyroid, consider nuclear medicine thyroid uptake and scan    09/29/2017 - 10/11/2017 Radiation Therapy    Pt received 27 Gy in 9 fractions directed at the areas of significant uptake noted on recent bone scan the right pelvis and right proximal femur, with Dr, Sondra Come     10/19/2017 -  03/25/2018 Chemotherapy    -Herceptin every 3 weeks starting 02/08/17, perjeta added on 10/20/2017  -weekly Taxol started on 10/21/2017. Will reduce Taxol to 2 weeks on, 1 week off starting on 02/04/17.  Stoped due to mild disease progression    10/27/2017 Imaging    10/27/2017 CT AP IMPRESSION: 1. No acute abnormality. No evidence of bowel obstruction or acute bowel inflammation. Normal appendix. 2. Stable patchy sclerotic osseous metastases throughout the visualized skeleton. No new or progressive  metastatic disease in the abdomen or pelvis.    01/11/2018 Imaging    Whole Body Bone Scan 01/11/18  IMPRESSION: 1. Multifocal osseous metastases as described. 2. Right scapular metastasis. 3. Right superior thoracic spinous process. 4. Right sacral ala and right L5 metastases. 5. Right acetabular metastasis. 6. Right proximal femur metastasis. 7. Diffuse metastases throughout the left femur. 8. Anterior right fourth rib metastasis.    01/11/2018 Imaging    CT CAP W Contrast 01/11/18  IMPRESSION: 1. Similar-appearing osseous metastatic disease. 2. No evidence for progressed metastatic disease in the chest, abdomen or pelvis.    03/30/2018 Imaging    Bone Scan 03/30/18  IMPRESSION: Stable multifocal bony metastatic disease.    03/30/2018 Imaging     CT CAP W Contrast 03/30/18 IMPRESSION: 1. New mild contiguous wall thickening with associated mucosal hyperenhancement throughout the left colon and rectum, suggesting nonspecific infectious or inflammatory proctocolitis. Differential includes C diff colitis. 2. Stable faint patchy sclerosis in the iliac bones and lumbar vertebral bodies. No new or progressive osseous metastatic disease by CT. Please note that the osseous lesions are relatively occult by CT and would be better evaluated by PET-CT, as clinically warranted. 3. No evidence of extra osseous metastatic disease.  These results will be called to the ordering clinician or representative by the Radiologist Assistant, and communication documented in the PACS or zVision Dashboard.    04/15/2018 -  Chemotherapy    Enhertu q3weeks starting 04/15/18    04/21/2018 PET scan    PET 04/21/18  IMPRESSION: 1. Overall marked interval improvement in bony hypermetabolic disease. All of the previously identified hypermetabolic osseous metastases have decreased in hypermetabolism in the interval. There is a new tiny focus of hypermetabolism in the right aspect of the L3 vertebral body  that appears to correspond to a new 8 mm lucency, raising concern for new metastatic disease. Close attention on follow-up recommended. 2. Focal hypermetabolism in the central spinal canal at the level of L1 previously has resolved. 3. No hypermetabolic soft tissue disease on today's study. 4. Right thyroid nodule seen previously has decreased in the interval.       CURRENT THERAPY:  Enhertuq3weeks starting 04/15/18  INTERVAL HISTORY:  [name] is here for a follow up and treatment. She presents to the clinic today with her interpretor. She notes she is doing fair. She notes she went to ED on 5/12 due to left upper hip pain which worsened. This is sore and throbbing pain which effects her sleep. Her right shoulder pain still persists. She can walk but it is painful, laying down hurts her a little less. She is not sure what exacerbated this. For the pain she is taking oxycodone 2 tabs every 4-6 hours. This is helping her pain, but still effects her sleep. Her sleep has mildly improved. She also has Robaxin but was concerned with taking it as the time as oxycodone.   After cycle 2 she presented to symptoms management clinic for nausea and vomiting. She notes  she is now eating better. She denies any diarrhea. She is still taking antiemetics only as needed. She denies fever.    REVIEW OF SYSTEMS:   Constitutional: Denies fevers, chills or abnormal weight loss (+) Improved sleep Eyes: Denies blurriness of vision Ears, nose, mouth, throat, and face: Denies mucositis or sore throat Respiratory: Denies cough, dyspnea or wheezes Cardiovascular: Denies palpitation, chest discomfort or lower extremity swelling Gastrointestinal:  Denies nausea, heartburn or change in bowel habits Skin: Denies abnormal skin rashes MSK: (+) Chronic right hip pain (+) Throbbing left hip pain (+) Persistent right shoulder pain Lymphatics: Denies new lymphadenopathy or easy bruising Neurological:Denies  numbness, tingling or new weaknesses Behavioral/Psych: Mood is stable, no new changes  All other systems were reviewed with the patient and are negative.  MEDICAL HISTORY:  Past Medical History:  Diagnosis Date  . Abnormal Pap smear   . Anxiety   . Breast cancer ([place])   . Cancer ([place]) 1999   breast-s/p mastectomy, chemo, rad  . CIN I (cervical intraepithelial neoplasia I) 2003   by colpo  . Congenital deafness   . Deaf   . Depression   . Port-A-Cath in place 2017   for chemotherapy  . Radiation 07/12/15-07/26/15   left femur 30 Gy  . S/P bilateral oophorectomy     SURGICAL HISTORY: Past Surgical History:  Procedure Laterality Date  . ABDOMINAL HYSTERECTOMY    . INTRAMEDULLARY (IM) NAIL INTERTROCHANTERIC Left 06/26/2015   Procedure: LEFT BIOMET LONG AFFIXS NAIL;  Surgeon: [name], MD;  Location: [facility];  Service: Orthopedics;  Laterality: Left;  . IR GENERIC HISTORICAL  08/06/2015   IR US GUIDE VASC ACCESS LEFT 08/06/2015 Aletta Edouard, MD [facility]  . IR GENERIC HISTORICAL  08/06/2015   IR FLUORO GUIDE CV LINE LEFT 08/06/2015 Aletta Edouard, MD [facility]  . IR GENERIC HISTORICAL  03/05/2016   IR CV LINE INJECTION 03/05/2016 Markus Daft, MD [facility]  . LEFT HEART CATH AND CORONARY ANGIOGRAPHY N/A 12/13/2017   Procedure: LEFT HEART CATH AND CORONARY ANGIOGRAPHY;  Surgeon: Leonie Man, MD;  Location: [facility];  Service: Cardiovascular;  Laterality: N/A;  . MASTECTOMY     right breast  . MASTECTOMY    . OVARIAN CYST REMOVAL    . RADIOLOGY WITH ANESTHESIA Left 06/25/2015   Procedure: MRI OF LEFT HIP WITH OR WITHOUT CONTRAST;  Surgeon: Medication Radiologist, MD;  Location: [facility];  Service: Radiology;  Laterality: Left;  DR. MCINTYRE/MRI  . TUBAL LIGATION      I have reviewed the social history and family history with the patient and they are unchanged from previous note.  ALLERGIES:  is allergic to chlorhexidine; promethazine hcl; and diphenhydramine  hcl.  MEDICATIONS:  Current Outpatient Medications  Medication Sig Dispense Refill  . albuterol (PROVENTIL HFA;VENTOLIN HFA) 108 (90 Base) MCG/ACT inhaler Inhale 2 puffs into the lungs every 6 (six) hours as needed for wheezing or shortness of breath. 1 Inhaler 0  . ALPRAZolam (XANAX) 0.25 MG tablet Take 1 tablet (0.25 mg total) by mouth 2 (two) times daily as needed for anxiety. 30 tablet 0  . diphenoxylate-atropine (LOMOTIL) 2.5-0.025 MG tablet Take 1-2 tablets by mouth 4 (four) times daily as needed for diarrhea or loose stools. 30 tablet 1  . lidocaine-prilocaine (EMLA) cream Apply 1 application topically as needed. Apply to Porta-Cath 1-2 hours prior to access as directed. 30 g 2  . methocarbamol (ROBAXIN) 500 MG tablet Take 1 tablet (500 mg  total) by mouth 2 (two) times daily. 20 tablet 0  . omeprazole (PRILOSEC) 20 MG capsule Take 1 capsule (20 mg total) by mouth daily. 30 capsule 0  . ondansetron (ZOFRAN) 8 MG tablet Take 1 tablet (8 mg total) by mouth every 8 (eight) hours as needed for nausea or vomiting. 45 tablet 1  . oxyCODONE (ROXICODONE) 5 MG immediate release tablet Take 2 tablets (10 mg total) by mouth every 6 (six) hours as needed for up to 3 days for severe pain. 24 tablet 0  . potassium chloride (K-DUR) 10 MEQ tablet Take 2 tablets (20 mEq total) by mouth 2 (two) times daily. 180 tablet 3  . prochlorperazine (COMPAZINE) 10 MG tablet Take 1 tablet (10 mg total) by mouth every 6 (six) hours as needed for nausea or vomiting. 60 tablet 2  . zolpidem (AMBIEN) 5 MG tablet Take 1 tablet (5 mg total) by mouth at bedtime as needed for sleep. 30 tablet 0  . dexamethasone (DECADRON) 4 MG tablet Take 1 tablet (4 mg total) by mouth daily. 20 tablet 0   No current facility-administered medications for this visit.    Facility-Administered Medications Ordered in Other Visits  Medication Dose Route Frequency Provider Last Rate Last Dose  . sodium chloride 0.9 % 1,000 mL with potassium  chloride 10 mEq infusion   Intravenous Continuous Gordy Levan, MD   Stopped at 09/10/15 1653  . sodium chloride 0.9 % injection 10 mL  10 mL Intravenous PRN Livesay, Lennis P, MD        PHYSICAL EXAMINATION: ECOG PERFORMANCE STATUS: 3 - Symptomatic, >50% confined to bed  Vitals:   05/27/18 0847  BP: 112/69  Pulse: 75  Resp: 18  Temp: 99.1 F (37.3 C)  SpO2: 99%   Filed Weights   05/27/18 0847  Weight: 128 lb 14.4 oz (58.5 kg)    GENERAL:alert, no distress and comfortable SKIN: skin color, texture, turgor are normal, no rashes or significant lesions EYES: normal, Conjunctiva are pink and non-injected, sclera clear OROPHARYNX:no exudate, no erythema and lips, buccal mucosa, and tongue normal  NECK: supple, thyroid normal size, non-tender, without nodularity LYMPH:  no palpable lymphadenopathy in the cervical, axillary or inguinal LUNGS: clear to auscultation and percussion with normal breathing effort HEART: regular rate & rhythm and no murmurs and no lower extremity edema ABDOMEN:abdomen soft, non-tender and normal bowel sounds Musculoskeletal:no cyanosis of digits and no clubbing (+) moderate difficulty ambulating (+) posterior Left upper hip tenderness NEURO: alert & oriented x 3 with fluent speech, no focal motor/sensory deficits  LABORATORY DATA:  I have reviewed the data as listed CBC Latest Ref Rng & Units 05/27/2018 05/24/2018 05/17/2018  WBC 4.0 - 10.5 K/uL 3.3(L) 3.5(L) 3.7(L)  Hemoglobin 12.0 - 15.0 g/dL 11.6(L) 11.7(L) 10.8(L)  Hematocrit 36.0 - 46.0 % 33.9(L) 35.4(L) 31.8(L)  Platelets 150 - 400 K/uL 189 240 157     CMP Latest Ref Rng & Units 05/27/2018 05/24/2018 05/17/2018  Glucose 70 - 99 mg/dL 92 98 88  BUN 6 - 20 mg/dL '8 6 8  '$ Creatinine 0.44 - 1.00 mg/dL 0.76 0.72 0.81  Sodium 135 - 145 mmol/L 141 141 142  Potassium 3.5 - 5.1 mmol/L 3.2(L) 2.7(LL) 3.0(LL)  Chloride 98 - 111 mmol/L 103 105 106  CO2 22 - 32 mmol/L '26 27 24  '$ Calcium 8.9 - 10.3 mg/dL 9.4  8.9 8.8(L)  Total Protein 6.5 - 8.1 g/dL 6.4(L) - 6.4(L)  Total Bilirubin 0.3 - 1.2 mg/dL 1.7(H) -  1.4(H)  Alkaline Phos 38 - 126 U/L 62 - 64  AST 15 - 41 U/L 19 - 16  ALT 0 - 44 U/L 18 - 16      RADIOGRAPHIC STUDIES: I have personally reviewed the radiological images as listed and agreed with the findings in the report. No results found.   ASSESSMENT & PLAN:  [name] is a 53 y.o. 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 Vernia Buff recently [employer], Herceptin and Perjetaq3weeks,but had mild disease progression as seen on 3/18/20CTscan  -She startedEnhertuq3weekson4/3/20. She tolerated moderately well with mild diarrhea and significantly delayed nausea and vomiting after cycle 2. Will watch for Pneumonia as Enhertu increases her risk of pneumonitis.  -she has recovered reasonable well, plan to reduce dose from '300mg'$  to '200mg'$  for cycle 3 today, and add IV Emend to pre-meds and called in Dexa '4mg'$  daily for 5 days after treatment, to help with her nausea.  -Labs revewied, CBC and CMP WNL except WBC 3.3, Hg 11.6, K 3.2, adequate for treatment , will proceed cycle 3 today  -f/u in 1 week  2.left low back pain -started about 5 days ago, went to ED, x-ray was negative for fracture or acute changes  -PET scan one month ago was negative for hypermetabolic lesion in that area  -she has tenderness at left side lumbar paraspinal tenderness, no other neurological deficits, she limps when she walks. This is likely muscular pain --I increased her Robaxin and she will continue Oxycodone as needed to control her pain. If not improving in the next few weeks I will obtain CT lumbar scan for further evaluation.  3. Body pain, secondary to bone  mets -since her cancer diagnosis, she has had b/l hip, shoulder and chest pain at different time points, likely related to her bone mets -overall improved and manageable  -Ipreviouslyprescribed herGabapentin, but decided not to take due to potential side effect of suicidal thoughts. -continue pain meds -Will give IV Morphine during infusion today (05/27/18)  4.Congenital deafness  5.Mild anemia, secondary to chemotherapy and malignancy -She will start with oral iron daily for 1-2 months and then stop. She will also start daily women's vitamin. -Hg at 11.6(05/27/18)   6.Hypokalemia -He has intermittent hyperkalemia, on oral supplementKCL. -N&V subsided and diarrhea is improving.  -I previously instructed her to reduce dairy products in diet.  -Kat3.2(05/27/18). Continue Oral Potassium daily   7.Goal of care discussion -she understand her goal of care is palliative to prolong her life -she is full code for now  8.Insomnia -She cannot take Benadryl due to allergy reaction. She has tried trazodone with no relief.  -Her insomnia is not currently controlled with '5mg'$  Ambien. Ipreviouslyincrease her to7.'5mg'$  at night.  -Due to recent shoulder and hip pain she has had to increase Ambien to '10mg'$  to help her fall asleep. I discussed that is the highest dose and she should not use '10mg'$  every night.    PLAN: -I increased her Robaxin, continue oxycodone as needed. I called in Dexamethasone '4mg'$  daily for 5 days after chemo  -Will give IV Morphine '4mg'$  today  -Lab, flush and f/u at 12N on 5/21  -will order CT lumbar w wo contrast to be done in 2 weeks, may cancel if her low back pain improves   No problem-specific Assessment &  Plan notes found for this encounter.   Orders Placed This Encounter  Procedures  . CT LUMBAR SPINE W WO CONTRAST    Standing Status:   Future    Standing Expiration Date:   08/27/2019    Order Specific Question:   If indicated for the ordered  procedure, I authorize the administration of contrast media per Radiology protocol    Answer:   Yes    Order Specific Question:   Is patient pregnant?    Answer:   No    Order Specific Question:   Preferred imaging location?    Answer:   [facility]    Order Specific Question:   Radiology Contrast Protocol - do NOT remove file path    Answer:   [email or web address]   All questions were answered. The patient knows to call the clinic with any problems, questions or concerns. No barriers to learning was detected. I spent 25 minutes counseling the patient face to face. The total time spent in the appointment was 30 minutes and more than 50% was on counseling and review of test results     Truitt Merle, MD 05/27/2018   I, Joslyn Devon, am acting as scribe for Truitt Merle, MD.   I have reviewed the above documentation for accuracy and completeness, and I agree with the above.

## 2018-05-27 ENCOUNTER — Encounter: Payer: Self-pay | Admitting: Hematology

## 2018-05-27 ENCOUNTER — Inpatient Hospital Stay: Payer: Medicaid Other

## 2018-05-27 ENCOUNTER — Telehealth: Payer: Self-pay | Admitting: Hematology

## 2018-05-27 ENCOUNTER — Inpatient Hospital Stay (HOSPITAL_BASED_OUTPATIENT_CLINIC_OR_DEPARTMENT_OTHER): Payer: Medicaid Other | Admitting: Hematology

## 2018-05-27 ENCOUNTER — Other Ambulatory Visit: Payer: Self-pay

## 2018-05-27 VITALS — BP 112/69 | HR 75 | Temp 99.1°F | Resp 18 | Ht 65.0 in | Wt 128.9 lb

## 2018-05-27 DIAGNOSIS — Z170 Estrogen receptor positive status [ER+]: Secondary | ICD-10-CM

## 2018-05-27 DIAGNOSIS — C50911 Malignant neoplasm of unspecified site of right female breast: Secondary | ICD-10-CM

## 2018-05-27 DIAGNOSIS — Z79899 Other long term (current) drug therapy: Secondary | ICD-10-CM | POA: Diagnosis not present

## 2018-05-27 DIAGNOSIS — Z9221 Personal history of antineoplastic chemotherapy: Secondary | ICD-10-CM | POA: Diagnosis not present

## 2018-05-27 DIAGNOSIS — Z5112 Encounter for antineoplastic immunotherapy: Secondary | ICD-10-CM | POA: Diagnosis not present

## 2018-05-27 DIAGNOSIS — Z923 Personal history of irradiation: Secondary | ICD-10-CM

## 2018-05-27 DIAGNOSIS — C7951 Secondary malignant neoplasm of bone: Secondary | ICD-10-CM

## 2018-05-27 DIAGNOSIS — E876 Hypokalemia: Secondary | ICD-10-CM

## 2018-05-27 DIAGNOSIS — Z95828 Presence of other vascular implants and grafts: Secondary | ICD-10-CM

## 2018-05-27 DIAGNOSIS — D6481 Anemia due to antineoplastic chemotherapy: Secondary | ICD-10-CM | POA: Diagnosis not present

## 2018-05-27 DIAGNOSIS — G4700 Insomnia, unspecified: Secondary | ICD-10-CM

## 2018-05-27 LAB — CBC WITH DIFFERENTIAL (CANCER CENTER ONLY)
Abs Immature Granulocytes: 0.03 10*3/uL (ref 0.00–0.07)
Basophils Absolute: 0 10*3/uL (ref 0.0–0.1)
Basophils Relative: 1 %
Eosinophils Absolute: 0.1 10*3/uL (ref 0.0–0.5)
Eosinophils Relative: 2 %
HCT: 33.9 % — ABNORMAL LOW (ref 36.0–46.0)
Hemoglobin: 11.6 g/dL — ABNORMAL LOW (ref 12.0–15.0)
Immature Granulocytes: 1 %
Lymphocytes Relative: 27 %
Lymphs Abs: 0.9 10*3/uL (ref 0.7–4.0)
MCH: 29.8 pg (ref 26.0–34.0)
MCHC: 34.2 g/dL (ref 30.0–36.0)
MCV: 87.1 fL (ref 80.0–100.0)
Monocytes Absolute: 0.5 10*3/uL (ref 0.1–1.0)
Monocytes Relative: 14 %
Neutro Abs: 1.9 10*3/uL (ref 1.7–7.7)
Neutrophils Relative %: 55 %
Platelet Count: 189 10*3/uL (ref 150–400)
RBC: 3.89 MIL/uL (ref 3.87–5.11)
RDW: 14.8 % (ref 11.5–15.5)
WBC Count: 3.3 10*3/uL — ABNORMAL LOW (ref 4.0–10.5)
nRBC: 0 % (ref 0.0–0.2)

## 2018-05-27 LAB — CMP (CANCER CENTER ONLY)
ALT: 18 U/L (ref 0–44)
AST: 19 U/L (ref 15–41)
Albumin: 3.4 g/dL — ABNORMAL LOW (ref 3.5–5.0)
Alkaline Phosphatase: 62 U/L (ref 38–126)
Anion gap: 12 (ref 5–15)
BUN: 8 mg/dL (ref 6–20)
CO2: 26 mmol/L (ref 22–32)
Calcium: 9.4 mg/dL (ref 8.9–10.3)
Chloride: 103 mmol/L (ref 98–111)
Creatinine: 0.76 mg/dL (ref 0.44–1.00)
GFR, Est AFR Am: >60 mL/min (ref >60)
GFR, Estimated: >60 mL/min (ref >60)
Glucose, Bld: 92 mg/dL (ref 70–99)
Potassium: 3.2 mmol/L — ABNORMAL LOW (ref 3.5–5.1)
Sodium: 141 mmol/L (ref 135–145)
Total Bilirubin: 1.7 mg/dL — ABNORMAL HIGH (ref 0.3–1.2)
Total Protein: 6.4 g/dL — ABNORMAL LOW (ref 6.5–8.1)

## 2018-05-27 MED ORDER — HEPARIN SOD (PORK) LOCK FLUSH 100 UNIT/ML IV SOLN
500.0000 [IU] | Freq: Once | INTRAVENOUS | Status: AC | PRN
  Administered 2018-05-27: 500 [IU]
  Filled 2018-05-27: qty 5

## 2018-05-27 MED ORDER — SODIUM CHLORIDE 0.9% FLUSH
10.0000 mL | Freq: Once | INTRAVENOUS | Status: AC
  Administered 2018-05-27: 10 mL
  Filled 2018-05-27: qty 10

## 2018-05-27 MED ORDER — PALONOSETRON HCL INJECTION 0.25 MG/5ML
INTRAVENOUS | Status: AC
  Filled 2018-05-27: qty 5

## 2018-05-27 MED ORDER — DEXTROSE 5 % IV SOLN
Freq: Once | INTRAVENOUS | Status: AC
  Administered 2018-05-27: 10:00:00 via INTRAVENOUS
  Filled 2018-05-27: qty 250

## 2018-05-27 MED ORDER — MORPHINE SULFATE (PF) 4 MG/ML IV SOLN
INTRAVENOUS | Status: AC
  Filled 2018-05-27: qty 1

## 2018-05-27 MED ORDER — SODIUM CHLORIDE 0.9% FLUSH
10.0000 mL | INTRAVENOUS | Status: DC | PRN
  Administered 2018-05-27: 12:00:00 10 mL
  Filled 2018-05-27: qty 10

## 2018-05-27 MED ORDER — DEXAMETHASONE 4 MG PO TABS: 4.0000 mg | ORAL_TABLET | Freq: Every day | ORAL | 0 refills | Status: DC

## 2018-05-27 MED ORDER — SODIUM CHLORIDE 0.9 % IV SOLN
Freq: Once | INTRAVENOUS | Status: AC
  Administered 2018-05-27: 10:00:00 via INTRAVENOUS
  Filled 2018-05-27: qty 5

## 2018-05-27 MED ORDER — ACETAMINOPHEN 325 MG PO TABS
650.0000 mg | ORAL_TABLET | Freq: Once | ORAL | Status: AC
  Administered 2018-05-27: 650 mg via ORAL

## 2018-05-27 MED ORDER — MORPHINE SULFATE 4 MG/ML IJ SOLN
4.0000 mg | Freq: Once | INTRAMUSCULAR | Status: AC
  Administered 2018-05-27: 4 mg via INTRAVENOUS
  Filled 2018-05-27: qty 1

## 2018-05-27 MED ORDER — ACETAMINOPHEN 325 MG PO TABS
ORAL_TABLET | ORAL | Status: AC
  Filled 2018-05-27: qty 2

## 2018-05-27 MED ORDER — PALONOSETRON HCL INJECTION 0.25 MG/5ML
0.2500 mg | Freq: Once | INTRAVENOUS | Status: AC
  Administered 2018-05-27: 0.25 mg via INTRAVENOUS

## 2018-05-27 MED ORDER — FAM-TRASTUZUMAB DERUXTECAN-NXKI CHEMO 100 MG IV SOLR
3.3000 mg/kg | Freq: Once | INTRAVENOUS | Status: AC
  Administered 2018-05-27: 11:00:00 200 mg via INTRAVENOUS
  Filled 2018-05-27: qty 10

## 2018-05-27 NOTE — Telephone Encounter (Signed)
Scheduled appt per 5/15 los. ° °Patient aware of appt date and time. °

## 2018-05-27 NOTE — Patient Instructions (Signed)
[facility] Discharge Instructions for Patients Receiving Chemotherapy  Today you received the following chemotherapy agents: Enhertu  To help prevent nausea and vomiting after your treatment, we encourage you to take your nausea medication as prescribed by your physician.    If you develop nausea and vomiting that is not controlled by your nausea medication, call the clinic.   BELOW ARE SYMPTOMS THAT SHOULD BE REPORTED IMMEDIATELY:  *FEVER GREATER THAN 100.5 F  *CHILLS WITH OR WITHOUT FEVER  NAUSEA AND VOMITING THAT IS NOT CONTROLLED WITH YOUR NAUSEA MEDICATION  *UNUSUAL SHORTNESS OF BREATH  *UNUSUAL BRUISING OR BLEEDING  TENDERNESS IN MOUTH AND THROAT WITH OR WITHOUT PRESENCE OF ULCERS  *URINARY PROBLEMS  *BOWEL PROBLEMS  UNUSUAL RASH Items with * indicate a potential emergency and should be followed up as soon as possible.  Feel free to call the clinic should you have any questions or concerns. The clinic phone number is [phone].  Please show the [facility] at check-in to the Emergency Department and triage nurse.

## 2018-05-30 ENCOUNTER — Telehealth: Payer: Self-pay

## 2018-05-30 NOTE — Telephone Encounter (Signed)
Left voice message through deaf interpretive services that her potassium is too low.  Per Dr. Burr Medico increase your potassium by one tablet daily, so take one more tablet than she is currently takes.  Encouraged her to call back if she has questions.

## 2018-05-30 NOTE — Telephone Encounter (Signed)
-----   Message from Truitt Merle, MD sent at 05/28/2018  6:42 PM EDT ----- Please let her know her K was low yesterday, increase 1 tab of KCl daily (inadditional to what she is taking now), thanks   Truitt Merle  05/28/2018

## 2018-05-31 ENCOUNTER — Other Ambulatory Visit: Payer: Self-pay | Admitting: Hematology

## 2018-05-31 ENCOUNTER — Other Ambulatory Visit: Payer: Self-pay | Admitting: Nurse Practitioner

## 2018-05-31 MED ORDER — ALPRAZOLAM 0.25 MG PO TABS: 0.2500 mg | ORAL_TABLET | Freq: Two times a day (BID) | ORAL | 0 refills | Status: DC | PRN

## 2018-05-31 MED ORDER — ZOLPIDEM TARTRATE 5 MG PO TABS: 7.5000 mg | ORAL_TABLET | Freq: Every evening | ORAL | 0 refills | Status: DC | PRN

## 2018-05-31 NOTE — Telephone Encounter (Signed)
Received call through deaf interpreter service that pt needs refills on her xanax & ambien.  Informed NP Cira Rue & she will send in scripts.

## 2018-06-01 NOTE — Progress Notes (Signed)
[place]   Telephone:(336) [phone] [phone]) [phone]   Clinic Follow up Note   Patient Care Team: System, Provider Not In as PCP - General Debra Killings, MD as Consulting Physician (Orthopedic Surgery)  Date of Service:  06/02/2018  CHIEF COMPLAINT: F/u ofmetastatic breast cancer  SUMMARY OF ONCOLOGIC HISTORY: Oncology History    Cancer Staging No matching staging information was found for the patient.       [name], right ([place])   11/1997 Cancer Diagnosis    Patient had 1.4 cm poorly differentiated right breast carcinoma diagnosed in Nov 1999 at age 53, 1/7 axillary nodes involved, ER/PR and HER 2 positive. She had mastectomy with the 7 axillary node evaluation, 4 cycles of adriamycin/ cytoxan followed by taxotere, then five years of tamoxifen thru June 2005 (no herceptin in 1999). She had bilateral oophorectomy in May 2005. She was briefly on aromatase inhibitor after tamoxifen, but discontinued this herself due to poor tolerance.    04/04/2015 Genetic Testing    Negative genetic testing on the breast/ovarian cancer pnael.  The Breast/Ovarian gene panel offered by [facility] includes sequencing and rearrangement analysis for the following 20 genes:  ATM, BARD1, BRCA1, BRCA2, BRIP1, CDH1, CHEK2, EPCAM, FANCC, MLH1, MSH2, MSH6, NBN, PALB2, PMS2, PTEN, RAD51C, RAD51D, TP53, and XRCC2.    06/26/2015 Pathology Results    Initial Path Report Bone, curettage, Left femur met, intramedullary subtroch tissue - METASTATIC ADENOCARCINOMA.  Estrogen Receptor: 95%, POSITIVE, STRONG STAINING INTENSITY Progesterone Receptor: 2%, POSITIVE, STRONG  HER2 (+), IHC 3+    06/26/2015 Surgery    Biopsy of metastatic tissue and stabilization with Affixus trochanteric nail, proximal and distal interlock of left femur by Dr. [name]     06/28/2015 Imaging    CT Chest w/ Contrast 1. Extensive right internal mammary chain lymphadenopathy consistent with  metastatic breast cancer. 2. Lytic metastases involving the posterior elements at T1 with probable nondisplaced pathologic fracture of the spinous process. 3. No other evidence of thoracic metastatic disease. 4. Trace bilateral pleural effusions with associated bibasilar atelectasis. 5. Indeterminate right thyroid nodule, not previously imaged. This could be evaluated with thyroid ultrasound as clinically warranted.    07/01/2015 Imaging    Bone Scan 1. Increased activity noted throughout the left femur. Although metastatic disease cannot be excluded, these changes are most likely secondary to prior surgery. 2. No other focal abnormalities identified to suggest metastatic disease.    07/12/2015 - 07/26/2015 Radiation Therapy    Left femur, 30 Gy in 10 fractions by Dr. Sondra Come     07/14/2015 Initial Diagnosis    [name], right ([place])    07/19/2015 Imaging    Initial PET Scan 1. Severe multifocal osseous metastatic disease, much greater than anticipated based on prior imaging. 2. Multifocal nodal metastases involving the right internal mammary, prevascular and subpectoral lymph nodes. No axillary pulmonary involvement identified. 3. No distant extra osseous metastases.    07/30/2015 - 11/29/2015 Chemotherapy    Docetaxel, Herceptin and Perjeta every 3 weeks X6 cycles, pt tolerated moderately well, restaging scan showed excellent response     09/18/2015 Imaging    CT Head w/wo Contrast 1. No acute intracranial abnormality or significant interval change. 2. Stable minimal periventricular white matter hypoattenuation on the right. This may reflect a remote ischemic injury. 3. No evidence for metastatic disease to the brain. 4. No focal soft tissue lesion to explain the patient's tenderness.    10/30/2015 Imaging  CT Abdomen Pelvis w/ Contrast 1. Few mildly prominent fluid-filled loops of small bowel scattered throughout the abdomen, with intraluminal fluid density  within the distal colon. Given the provided history, findings are suggestive of acute enteritis/diarrheal illness. 2. No other acute intra-abdominal or pelvic process identified. 3. Widespread osseous metastatic disease, better evaluated on most recent PET-CT from 07/19/2015. No pathologic fracture or other complication. 4. 11 mm hypodensity within the left kidney. This lesion measures intermediate density, and is indeterminate. While this lesion is similar in size relative to recent studies, this is increased in size relative to prior study from 2012. Further evaluation with dedicated renal mass protocol CT and/or MRI is recommended for complete characterization.    12/2015 - 05/14/2016 Chemotherapy    Maintenance Herceptin and pejeta every 3 weeks stopped on 05/14/16 due to disease progression    12/25/2015 Imaging    Restaging PET Scan 1. Markedly improved skeletal activity, with only several faint foci of residual accentuated metabolic activity, but resolution of the vast majority of the previously extensive osseous metastatic disease. 2. Resolution of the prior right internal mammary and right prevascular lymph nodes. 3. Residual subcutaneous edema and edema along fascia planes in the left upper thigh. This remains somewhat more than I would expect for placement of an IM nail 6 weeks ago. If there is leg swelling further down, consider Doppler venous ultrasound to rule out left lower extremity DVT. I do not perceive an obvious difference in density between the pelvic and common femoral veins on the noncontrast CT data. 4. Chronic right maxillary and right sphenoid sinusitis.    01/16/2016 - 10/20/2017 Anti-estrogen oral therapy    Letrozole 2.5 mg daily. She was switched to exemestane due to disease progression on 02/08/17. Stopped on 10/20/2017 when treatment changed to chemo     03/01/2016 Miscellaneous    Patient presented to ED following a fall; she reports she landed on her back and is now  experiencing back pain. The patient was evaluated and discharge home same day with pain control.    05/01/2016 Imaging    CT CAP IMPRESSION: 1. Stable exam.  No new or progressive disease identified. 2. No evidence for residual or recurrent adenopathy within the chest. 3. Stable bone metastasis.    05/01/2016 Imaging    BONE SCAN IMPRESSION: 1. Small focal uptake involving the anterior rib ends of the left second and right fourth ribs, new since the prior bone scan. The location of this uptake is more suggestive of a traumatic or inflammatory etiology as opposed to metastatic disease. No other evidence of metastatic disease. 2. There are other areas of stable uptake which are most likely degenerative/reactive, including right shoulder and cervical spine uptake and uptake along the right femur adjacent to the ORIF hardware.    05/20/2016 Imaging    NM PET Skull to Thigh IMPRESSION: 1. There multiple metastatic foci in the bony pelvis, including some new abnormal foci compare to the prior PET-CT, compatible with mildly progressive bony metastatic disease. Also, a left T11 vertebral hypermetabolic metastatic lesion is observed, new compared to the prior exam. 2. No active extraosseous malignancy is currently identified. 3. Right anterior fourth rib healing fracture, appears be    06/04/2016 - 01/08/2017 Chemotherapy    Second line chemotherapy Kadcyla every 3 weeks started on 06/04/16 and stopped on 01/08/17 due to mixed response.      10/05/2016 Imaging    CT CAP and Bone Scan:  Bones: left intramedullary rod and left femoral  head neck screw in place. In the proximal diaphysis of the left femur there is cortical thickening and irregularity. No lytic or blastic bone lesions. No fracture or vertebral endplate destruction.   No definite evidence of recurrent disease in the chest, abdomen, or pelvis.    10/28/2016 Imaging    CT A/P IMPRESSION: No acute process demonstrated in the  abdomen or pelvis. No evidence of bowel obstruction or inflammation.    11/04/2016 Imaging    DG foot complete left: IMPRESSION: No fracture or dislocation.  No soft tissue abnormality    11/23/2016 Imaging    CT RIGHT FEMUR IMPRESSION: Negative CT scan of the right thigh.  No visible metastatic disease.  CT LEFT FEMUR IMPRESSION: 1. Postsurgical changes related to prior cephalomedullary rod fixation of the left femur with linear lucency along the anterolateral proximal femoral cortex at level of the lesser trochanter, suspicious for nondisplaced fracture. 2. Slightly permeative appearance of the proximal left femoral cortex at the site of known prior osseous metastases is largely unchanged. 3. Unchanged patchy lucency and sclerosis along the anterior acetabulum, which appears to correspond with an area of faint uptake on the prior PET-CT, suspicious for osseous metastasis. These results will be called to the ordering clinician or representative by the Radiologist Assistant, and communication documented in the PACS or zVision Dashboard.      01/27/2017 Imaging    IMPRESSION: 1. Mixed response to osseous metastasis. Some hypermetabolic lesions are new and increasingly hypermetabolic, while another index lesion is decreasingly hypermetabolic. 2. No evidence of hypermetabolic soft tissue metastasis.     02/08/2017 - 10/19/2017 Antibody Plan    -Herceptin every 3 week since 02/08/17 -Oral Lanpantinib 1542m and decreased to 5024mdue to diarrhea and depression. Due to disease progression she swithced to Verzenio 15020mn 05/04/17. She did not toelrate well so she was switched to Ibrance 100m13m 05/31/17. Due to her neutropenia, Ibrance dose reduced to 75 mg daily, 3 weeks on, one-week off, stopped on 10/20/2017 due to disease progression.     04/30/2017 PET scan    IMPRESSION: 1. Primarily increase in hypermetabolism of osseous metastasis. An isolated right acetabular lesion is  decreased in hypermetabolism since the prior exam. 2. No evidence of hypermetabolic soft tissue metastasis. 3. Similar non FDG avid right-sided thyroid nodule, favoring a benign etiology. Recommend attention on follow-up.    08/30/2017 PET scan    08/30/2017 PET Scan  IMPRESSION: 1. Worsening osseous metastatic disease. 2. Focal hypermetabolism in the central spinal canal the L1 level, similar to the prior exam. Metastatic implant cannot be excluded. 3. Slight enlargement of a right thyroid nodule which now appears more cystic in character. Consider further evaluation with thyroid ultrasound. If patient is clinically hyperthyroid, consider nuclear medicine thyroid uptake and scan    08/30/2017 Progression    08/30/2017 PET Scan  IMPRESSION: 1. Worsening osseous metastatic disease. 2. Focal hypermetabolism in the central spinal canal the L1 level, similar to the prior exam. Metastatic implant cannot be excluded. 3. Slight enlargement of a right thyroid nodule which now appears more cystic in character. Consider further evaluation with thyroid ultrasound. If patient is clinically hyperthyroid, consider nuclear medicine thyroid uptake and scan    09/29/2017 - 10/11/2017 Radiation Therapy    Pt received 27 Gy in 9 fractions directed at the areas of significant uptake noted on recent bone scan the right pelvis and right proximal femur, with Dr, KinaSondra Come 10/19/2017 - 03/25/2018 Chemotherapy    -  Herceptin every 3 weeks starting 02/08/17, perjeta added on 10/20/2017  -weekly Taxol started on 10/21/2017. Will reduce Taxol to 2 weeks on, 1 week off starting on 02/04/17.  Stoped due to mild disease progression    10/27/2017 Imaging    10/27/2017 CT AP IMPRESSION: 1. No acute abnormality. No evidence of bowel obstruction or acute bowel inflammation. Normal appendix. 2. Stable patchy sclerotic osseous metastases throughout the visualized skeleton. No new or progressive metastatic disease in the  abdomen or pelvis.    01/11/2018 Imaging    Whole Body Bone Scan 01/11/18  IMPRESSION: 1. Multifocal osseous metastases as described. 2. Right scapular metastasis. 3. Right superior thoracic spinous process. 4. Right sacral ala and right L5 metastases. 5. Right acetabular metastasis. 6. Right proximal femur metastasis. 7. Diffuse metastases throughout the left femur. 8. Anterior right fourth rib metastasis.    01/11/2018 Imaging    CT CAP W Contrast 01/11/18  IMPRESSION: 1. Similar-appearing osseous metastatic disease. 2. No evidence for progressed metastatic disease in the chest, abdomen or pelvis.    03/30/2018 Imaging    Bone Scan 03/30/18  IMPRESSION: Stable multifocal bony metastatic disease.    03/30/2018 Imaging     CT CAP W Contrast 03/30/18 IMPRESSION: 1. New mild contiguous wall thickening with associated mucosal hyperenhancement throughout the left colon and rectum, suggesting nonspecific infectious or inflammatory proctocolitis. Differential includes C diff colitis. 2. Stable faint patchy sclerosis in the iliac bones and lumbar vertebral bodies. No new or progressive osseous metastatic disease by CT. Please note that the osseous lesions are relatively occult by CT and would be better evaluated by PET-CT, as clinically warranted. 3. No evidence of extra osseous metastatic disease.  These results will be called to the ordering clinician or representative by the Radiologist Assistant, and communication documented in the PACS or zVision Dashboard.    04/15/2018 -  Chemotherapy    Enhertu q3weeks starting 04/15/18    04/21/2018 PET scan    PET 04/21/18  IMPRESSION: 1. Overall marked interval improvement in bony hypermetabolic disease. All of the previously identified hypermetabolic osseous metastases have decreased in hypermetabolism in the interval. There is a new tiny focus of hypermetabolism in the right aspect of the L3 vertebral body that appears to correspond  to a new 8 mm lucency, raising concern for new metastatic disease. Close attention on follow-up recommended. 2. Focal hypermetabolism in the central spinal canal at the level of L1 previously has resolved. 3. No hypermetabolic soft tissue disease on today's study. 4. Right thyroid nodule seen previously has decreased in the interval.       CURRENT THERAPY:  Enhertuq3weeks starting 04/15/18  INTERVAL HISTORY:  [name] is here for a follow up. She presents today with her interpretor. She notes change in taste to metal so she has not been eating. She denies mouth sores. She had nausea and vomited some even with antiemetics. She did not have a BM for several days until yesterday.  She notes she still has her body pain and it hurts to walk. She notes most of the time she is in the bed or chair. She notes she does have a walker. Her CT is 06/16/18 but is willing to have it moved up. She notes she has not been drinking water, but does drink a lot of milk today. She denies dizziness upon standing. She notes she is still on pain medication.     REVIEW OF SYSTEMS:   Constitutional: Denies fevers, chills (+) taste change,  anorexia, weight loss  Eyes: Denies blurriness of vision Ears, nose, mouth, throat, and face: Denies mucositis or sore throat Respiratory: Denies cough, dyspnea or wheezes Cardiovascular: Denies palpitation, chest discomfort or lower extremity swelling Gastrointestinal:  Denies nausea, heartburn (+) Constipation  Skin: Denies abnormal skin rashes MSK: (+) Chronic right hip pain (+) Throbbing left hip pain (+) Persistent right shoulder pain Lymphatics: Denies new lymphadenopathy  Lymphatics: Denies new lymphadenopathy or easy bruising Neurological:Denies numbness, tingling or new weaknesses Behavioral/Psych: Mood is stable, no new changes  All other systems were reviewed with the patient and are negative.  MEDICAL HISTORY:  Past Medical History:  Diagnosis Date   . Abnormal Pap smear   . Anxiety   . Breast cancer ([place])   . Cancer ([place]) 1999   breast-s/p mastectomy, chemo, rad  . CIN I (cervical intraepithelial neoplasia I) 2003   by colpo  . Congenital deafness   . Deaf   . Depression   . Port-A-Cath in place 2017   for chemotherapy  . Radiation 07/12/15-07/26/15   left femur 30 Gy  . S/P bilateral oophorectomy     SURGICAL HISTORY: Past Surgical History:  Procedure Laterality Date  . ABDOMINAL HYSTERECTOMY    . INTRAMEDULLARY (IM) NAIL INTERTROCHANTERIC Left 06/26/2015   Procedure: LEFT BIOMET LONG AFFIXS NAIL;  Surgeon: Debra Killings, MD;  Location: [facility];  Service: Orthopedics;  Laterality: Left;  . IR GENERIC HISTORICAL  08/06/2015   IR US GUIDE VASC ACCESS LEFT 08/06/2015 Aletta Edouard, MD [facility]  . IR GENERIC HISTORICAL  08/06/2015   IR FLUORO GUIDE CV LINE LEFT 08/06/2015 Aletta Edouard, MD [facility]  . IR GENERIC HISTORICAL  03/05/2016   IR CV LINE INJECTION 03/05/2016 Markus Daft, MD [facility]  . LEFT HEART CATH AND CORONARY ANGIOGRAPHY N/A 12/13/2017   Procedure: LEFT HEART CATH AND CORONARY ANGIOGRAPHY;  Surgeon: Leonie Man, MD;  Location: [facility];  Service: Cardiovascular;  Laterality: N/A;  . MASTECTOMY     right breast  . MASTECTOMY    . OVARIAN CYST REMOVAL    . RADIOLOGY WITH ANESTHESIA Left 06/25/2015   Procedure: MRI OF LEFT HIP WITH OR WITHOUT CONTRAST;  Surgeon: Medication Radiologist, MD;  Location: [facility];  Service: Radiology;  Laterality: Left;  DR. MCINTYRE/MRI  . TUBAL LIGATION      I have reviewed the social history and family history with the patient and they are unchanged from previous note.  ALLERGIES:  is allergic to chlorhexidine; promethazine hcl; and diphenhydramine hcl.  MEDICATIONS:  Current Outpatient Medications  Medication Sig Dispense Refill  . albuterol (PROVENTIL HFA;VENTOLIN HFA) 108 (90 Base) MCG/ACT inhaler Inhale 2 puffs into the lungs every 6 (six) hours as needed  for wheezing or shortness of breath. 1 Inhaler 0  . ALPRAZolam (XANAX) 0.25 MG tablet Take 1 tablet (0.25 mg total) by mouth 2 (two) times daily as needed for anxiety. 30 tablet 0  . dexamethasone (DECADRON) 4 MG tablet Take 1 tablet (4 mg total) by mouth daily. 20 tablet 0  . diphenoxylate-atropine (LOMOTIL) 2.5-0.025 MG tablet Take 1-2 tablets by mouth 4 (four) times daily as needed for diarrhea or loose stools. 30 tablet 1  . lidocaine-prilocaine (EMLA) cream Apply 1 application topically as needed. Apply to Porta-Cath 1-2 hours prior to access as directed. 30 g 2  . methocarbamol (ROBAXIN) 500 MG tablet Take 1 tablet (500 mg total) by mouth 2 (two) times daily. 20 tablet 0  . omeprazole (  PRILOSEC) 20 MG capsule Take 1 capsule (20 mg total) by mouth daily. 30 capsule 0  . ondansetron (ZOFRAN) 8 MG tablet Take 1 tablet (8 mg total) by mouth every 8 (eight) hours as needed for nausea or vomiting. 45 tablet 1  . potassium chloride (K-DUR) 10 MEQ tablet Take 2 tablets (20 mEq total) by mouth 2 (two) times daily. 180 tablet 3  . prochlorperazine (COMPAZINE) 10 MG tablet Take 1 tablet (10 mg total) by mouth every 6 (six) hours as needed for nausea or vomiting. 60 tablet 2  . zolpidem (AMBIEN) 5 MG tablet Take 1.5 tablets (7.5 mg total) by mouth at bedtime as needed for sleep. 45 tablet 0   No current facility-administered medications for this visit.    Facility-Administered Medications Ordered in Other Visits  Medication Dose Route Frequency Provider Last Rate Last Dose  . sodium chloride 0.9 % 1,000 mL with potassium chloride 10 mEq infusion   Intravenous Continuous Gordy Levan, MD   Stopped at 09/10/15 1653  . sodium chloride 0.9 % injection 10 mL  10 mL Intravenous PRN Livesay, Lennis P, MD        PHYSICAL EXAMINATION: ECOG PERFORMANCE STATUS: 3 - Symptomatic, >50% confined to bed  Vitals:   06/02/18 1159  BP: 116/70  Pulse: (!) 57  Resp: 16  Temp: 98.5 F (36.9 C)  SpO2: 100%    Filed Weights   06/02/18 1159  Weight: 123 lb 8 oz (56 kg)    GENERAL:alert, no distress and comfortable SKIN: skin color, texture, turgor are normal, no rashes or significant lesions EYES: normal, Conjunctiva are pink and non-injected, sclera clear OROPHARYNX:no exudate, no erythema and lips, buccal mucosa, and tongue normal  NECK: supple, thyroid normal size, non-tender, without nodularity LYMPH:  no palpable lymphadenopathy in the cervical, axillary or inguinal LUNGS: clear to auscultation and percussion with normal breathing effort HEART: regular rate & rhythm and no murmurs and no lower extremity edema ABDOMEN:abdomen soft, non-tender and normal bowel sounds Musculoskeletal:no cyanosis of digits and no clubbing  NEURO: alert & oriented x 3 with fluent speech, no focal motor/sensory deficits  LABORATORY DATA:  I have reviewed the data as listed CBC Latest Ref Rng & Units 06/02/2018 05/27/2018 05/24/2018  WBC 4.0 - 10.5 K/uL 9.7 3.3(L) 3.5(L)  Hemoglobin 12.0 - 15.0 g/dL 13.1 11.6(L) 11.7(L)  Hematocrit 36.0 - 46.0 % 38.0 33.9(L) 35.4(L)  Platelets 150 - 400 K/uL 254 189 240     CMP Latest Ref Rng & Units 06/02/2018 05/27/2018 05/24/2018  Glucose 70 - 99 mg/dL 96 92 98  BUN 6 - 20 mg/dL 25(H) 8 6  Creatinine 0.44 - 1.00 mg/dL 0.84 0.76 0.72  Sodium 135 - 145 mmol/L 135 141 141  Potassium 3.5 - 5.1 mmol/L 4.4 3.2(L) 2.7(LL)  Chloride 98 - 111 mmol/L 98 103 105  CO2 22 - 32 mmol/L _0 Calcium 8.9 - 10.3 mg/dL 9.2 9.4 8.9  Total Protein 6.5 - 8.1 g/dL 6.9 6.4(L) -  Total Bilirubin 0.3 - 1.2 mg/dL 1.7(H) 1.7(H) -  Alkaline Phos 38 - 126 U/L 62 62 -  AST 15 - 41 U/L 14(L) 19 -  ALT 0 - 44 U/L 26 18 -      RADIOGRAPHIC STUDIES: I have personally reviewed the radiological images as listed and agreed with the findings in the report. No results found.   ASSESSMENT & PLAN:  SHAIANNE NUCCI is a 53 y.o. 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 Vernia Buff recently [employer], Herceptin and Perjetaq3weeks,but had mild disease progression as seen on 3/18/20CTscan  -She startedEnhertuq3weekson4/3/20. She tolerated moderately well with mild diarrhea and significantly delayed nausea and vomiting after cycle 2. Reduced to from 343m to 2026mfor cycle 3. Will watch for Pneumonia as Enhertu increases her risk ofpneumonitis. -She continues to have mild to moderate N&V, constipation, taste change, anorexia and significant weight loss. I discussed her tolerance of enhurtu has not been very adequate. I discussed if she is not able to gain or maintain weight we will stop enhurtu and return to chemotherapy.  -Labs revewied, CBC WNL except ANC 8.3, CMP WNL except tbil 1.7. I encouraged her to drink at least 40 ounces a day. -CT scan in the next 1-2 weeks.   -F/u in 2 weeks   2.left low back pain -started about 5 days ago, went to ED, x-ray was negative for fracture or acute changes  -PET scan one month ago was negative for hypermetabolic lesion in that area  -She has tenderness at left side lumbar paraspinal tenderness, no other neurological deficits, she limps when she walks. This is likely muscular pain -I previously increased her Robaxin and she will continue Oxycodone as needed to control her pain. If not improving in the next few weeks I will obtain CT lumbar scan for further evaluation. -Will obtain Lumbar Spine CT, which is scheduled in 2 weeks.   3. Body pain, secondary to bone mets -since her cancer diagnosis, she has had b/l hip, shoulder and chest pain at different time points, likely related to her bone mets -overall improved and manageable  -Ipreviouslyprescribed herGabapentin, but decided not to take due to  potential side effect of suicidal thoughts. -Stable, continue pain meds  4.Congenital deafness  5.Mild anemia, secondary to chemotherapy and malignancy -She will start with oral iron daily for 1-2 months and then stop. She will also start daily women's vitamin. -Hg normalized to 13.1(06/02/18)   6.Hypokalemia -He has intermittent hyperkalemia, on oral supplementKCL. -N&Vsubsided and diarrhea is improving. -I previously instructed her to reduce dairy products in diet. -Kat4.4(06/02/18). Continue Oral Potassium daily   7.Goal of care discussion -she understand her goal of care is palliative to prolong her life -she is full code for now  8.Insomnia -She cannot take Benadryl due to allergy reaction. She has tried trazodone with no relief.  -Her insomnia is not currently controlled with 37m10mmbien. Ipreviouslyincrease her to7.37mg67m night. -Due to recent shoulder and hip pain she has had to increase Ambien to 10mg74mhelp her fall asleep. I discussed that is the highest dose and she should not use 10mg 77my night.  9. Anorexia, weight loss -With enhurtu she has had taste change which lead to her not eating adequately and a 5 pound weight loss in 1 week.  -I strongly encouraged her to increase protein, calorie and overall food consumption to gain weight along with supplements.  -I also encouraged her to drink at least 40 ounces of liquid a day.     PLAN: -I encourage her to take more nutritional supplement and try to gain weight back -CT lumbar w wo contrast scheduled in 2 weeks -Lab, flush, f/u and enhertu in 2 weeks     No problem-specific Assessment & Plan notes found for this encounter.  No orders of the defined types were placed in this encounter.  All questions were answered. The patient knows to call the clinic with any problems, questions or concerns. No barriers to learning was detected. I spent 15 minutes counseling the patient face to  face. The total time spent in the appointment was 20 minutes and more than 50% was on counseling and review of test results     Truitt Merle, MD 06/02/2018   I, Joslyn Devon, am acting as scribe for Truitt Merle, MD.   I have reviewed the above documentation for accuracy and completeness, and I agree with the above.

## 2018-06-02 ENCOUNTER — Inpatient Hospital Stay (HOSPITAL_BASED_OUTPATIENT_CLINIC_OR_DEPARTMENT_OTHER): Payer: Medicaid Other | Admitting: Hematology

## 2018-06-02 ENCOUNTER — Other Ambulatory Visit: Payer: Self-pay

## 2018-06-02 ENCOUNTER — Inpatient Hospital Stay: Payer: Medicaid Other

## 2018-06-02 ENCOUNTER — Encounter: Payer: Self-pay | Admitting: Hematology

## 2018-06-02 VITALS — BP 116/70 | HR 57 | Temp 98.5°F | Resp 16 | Ht 65.0 in | Wt 123.5 lb

## 2018-06-02 DIAGNOSIS — C7951 Secondary malignant neoplasm of bone: Secondary | ICD-10-CM

## 2018-06-02 DIAGNOSIS — D6481 Anemia due to antineoplastic chemotherapy: Secondary | ICD-10-CM

## 2018-06-02 DIAGNOSIS — C50911 Malignant neoplasm of unspecified site of right female breast: Secondary | ICD-10-CM | POA: Diagnosis not present

## 2018-06-02 DIAGNOSIS — Z9221 Personal history of antineoplastic chemotherapy: Secondary | ICD-10-CM | POA: Diagnosis not present

## 2018-06-02 DIAGNOSIS — R112 Nausea with vomiting, unspecified: Secondary | ICD-10-CM

## 2018-06-02 DIAGNOSIS — E876 Hypokalemia: Secondary | ICD-10-CM | POA: Diagnosis not present

## 2018-06-02 DIAGNOSIS — Z5112 Encounter for antineoplastic immunotherapy: Secondary | ICD-10-CM | POA: Diagnosis not present

## 2018-06-02 DIAGNOSIS — Z79899 Other long term (current) drug therapy: Secondary | ICD-10-CM | POA: Diagnosis not present

## 2018-06-02 DIAGNOSIS — Z170 Estrogen receptor positive status [ER+]: Secondary | ICD-10-CM

## 2018-06-02 DIAGNOSIS — R5383 Other fatigue: Secondary | ICD-10-CM | POA: Diagnosis not present

## 2018-06-02 DIAGNOSIS — Z923 Personal history of irradiation: Secondary | ICD-10-CM

## 2018-06-02 DIAGNOSIS — R197 Diarrhea, unspecified: Secondary | ICD-10-CM | POA: Diagnosis not present

## 2018-06-02 DIAGNOSIS — Z95828 Presence of other vascular implants and grafts: Secondary | ICD-10-CM

## 2018-06-02 LAB — CBC WITH DIFFERENTIAL (CANCER CENTER ONLY)
Abs Immature Granulocytes: 0.12 10*3/uL — ABNORMAL HIGH (ref 0.00–0.07)
Basophils Absolute: 0 10*3/uL (ref 0.0–0.1)
Basophils Relative: 0 %
Eosinophils Absolute: 0 10*3/uL (ref 0.0–0.5)
Eosinophils Relative: 0 %
HCT: 38 % (ref 36.0–46.0)
Hemoglobin: 13.1 g/dL (ref 12.0–15.0)
Immature Granulocytes: 1 %
Lymphocytes Relative: 10 %
Lymphs Abs: 0.9 10*3/uL (ref 0.7–4.0)
MCH: 30 pg (ref 26.0–34.0)
MCHC: 34.5 g/dL (ref 30.0–36.0)
MCV: 87.2 fL (ref 80.0–100.0)
Monocytes Absolute: 0.3 10*3/uL (ref 0.1–1.0)
Monocytes Relative: 3 %
Neutro Abs: 8.3 10*3/uL — ABNORMAL HIGH (ref 1.7–7.7)
Neutrophils Relative %: 86 %
Platelet Count: 254 10*3/uL (ref 150–400)
RBC: 4.36 MIL/uL (ref 3.87–5.11)
RDW: 14 % (ref 11.5–15.5)
WBC Count: 9.7 10*3/uL (ref 4.0–10.5)
nRBC: 0 % (ref 0.0–0.2)

## 2018-06-02 LAB — CMP (CANCER CENTER ONLY)
ALT: 26 U/L (ref 0–44)
AST: 14 U/L — ABNORMAL LOW (ref 15–41)
Albumin: 3.8 g/dL (ref 3.5–5.0)
Alkaline Phosphatase: 62 U/L (ref 38–126)
Anion gap: 11 (ref 5–15)
BUN: 25 mg/dL — ABNORMAL HIGH (ref 6–20)
CO2: 26 mmol/L (ref 22–32)
Calcium: 9.2 mg/dL (ref 8.9–10.3)
Chloride: 98 mmol/L (ref 98–111)
Creatinine: 0.84 mg/dL (ref 0.44–1.00)
GFR, Est AFR Am: >60 mL/min (ref >60)
GFR, Estimated: >60 mL/min (ref >60)
Glucose, Bld: 96 mg/dL (ref 70–99)
Potassium: 4.4 mmol/L (ref 3.5–5.1)
Sodium: 135 mmol/L (ref 135–145)
Total Bilirubin: 1.7 mg/dL — ABNORMAL HIGH (ref 0.3–1.2)
Total Protein: 6.9 g/dL (ref 6.5–8.1)

## 2018-06-02 MED ORDER — SODIUM CHLORIDE 0.9% FLUSH
10.0000 mL | Freq: Once | INTRAVENOUS | Status: AC
  Administered 2018-06-02: 10 mL
  Filled 2018-06-02: qty 10

## 2018-06-02 MED ORDER — HEPARIN SOD (PORK) LOCK FLUSH 100 UNIT/ML IV SOLN
500.0000 [IU] | Freq: Once | INTRAVENOUS | Status: AC | PRN
  Administered 2018-06-02: 500 [IU] via INTRAVENOUS
  Filled 2018-06-02: qty 5

## 2018-06-07 ENCOUNTER — Telehealth: Payer: Self-pay | Admitting: Hematology

## 2018-06-07 NOTE — Telephone Encounter (Signed)
No los per 5/21. °

## 2018-06-10 ENCOUNTER — Telehealth: Payer: Self-pay

## 2018-06-10 ENCOUNTER — Other Ambulatory Visit: Payer: Self-pay | Admitting: Hematology

## 2018-06-10 MED ORDER — OXYCODONE HCL 5 MG PO TABS: 5.0000 mg | ORAL_TABLET | Freq: Four times a day (QID) | ORAL | 0 refills | Status: DC | PRN

## 2018-06-10 NOTE — Telephone Encounter (Signed)
Patient called for refill on Oxycodone (completely out) Dr. Burr Medico refilled her Oxycodone to [facility] at [facility], explained enough to last until her appointment next Friday, patient verbalized an understanding.  This call was made through deaf interpretive services.

## 2018-06-15 ENCOUNTER — Other Ambulatory Visit: Payer: Self-pay | Admitting: Oncology

## 2018-06-15 NOTE — Progress Notes (Signed)
[place]   [phone] [phone]   Clinic Follow up Note   Patient Care Team: System, Provider Not In as PCP - General [name], MD as Consulting Physician (Orthopedic Surgery)  Date of Service:  06/17/2018  CHIEF COMPLAINT: F/u ofmetastatic breast cancer  SUMMARY OF ONCOLOGIC HISTORY: Oncology History    Cancer Staging No matching staging information was found for the patient.       [name], right ([employer])   11/1997 Cancer Diagnosis    Patient had 1.4 cm poorly differentiated right breast carcinoma diagnosed in Nov 1999 at age 64, 1/7 axillary nodes involved, ER/PR and HER 2 positive. She had mastectomy with the 7 axillary node evaluation, 4 cycles of adriamycin/ cytoxan followed by taxotere, then five years of tamoxifen thru June 2005 (no herceptin in 1999). She had bilateral oophorectomy in May 2005. She was briefly on aromatase inhibitor after tamoxifen, but discontinued this herself due to poor tolerance.    04/04/2015 Genetic Testing    Negative genetic testing on the breast/ovarian cancer pnael.  The Breast/Ovarian gene panel offered by [facility] includes sequencing and rearrangement analysis for the following 20 genes:  ATM, BARD1, BRCA1, BRCA2, BRIP1, CDH1, CHEK2, EPCAM, FANCC, MLH1, MSH2, MSH6, NBN, PALB2, PMS2, PTEN, RAD51C, RAD51D, TP53, and XRCC2.    06/26/2015 Pathology Results    Initial Path Report Bone, curettage, Left femur met, intramedullary subtroch tissue - METASTATIC ADENOCARCINOMA.  Estrogen Receptor: 95%, POSITIVE, STRONG STAINING INTENSITY Progesterone Receptor: 2%, POSITIVE, STRONG  HER2 (+), IHC 3+    06/26/2015 Surgery    Biopsy of metastatic tissue and stabilization with Affixus trochanteric nail, proximal and distal interlock of left femur by Dr. [name]     06/28/2015 Imaging    CT Chest w/ Contrast 1. Extensive right internal mammary chain lymphadenopathy consistent with  metastatic breast cancer. 2. Lytic metastases involving the posterior elements at T1 with probable nondisplaced pathologic fracture of the spinous process. 3. No other evidence of thoracic metastatic disease. 4. Trace bilateral pleural effusions with associated bibasilar atelectasis. 5. Indeterminate right thyroid nodule, not previously imaged. This could be evaluated with thyroid ultrasound as clinically warranted.    07/01/2015 Imaging    Bone Scan 1. Increased activity noted throughout the left femur. Although metastatic disease cannot be excluded, these changes are most likely secondary to prior surgery. 2. No other focal abnormalities identified to suggest metastatic disease.    07/12/2015 - 07/26/2015 Radiation Therapy    Left femur, 30 Gy in 10 fractions by Dr. Sondra Come     07/14/2015 Initial Diagnosis    [name], right ([place])    07/19/2015 Imaging    Initial PET Scan 1. Severe multifocal osseous metastatic disease, much greater than anticipated based on prior imaging. 2. Multifocal nodal metastases involving the right internal mammary, prevascular and subpectoral lymph nodes. No axillary pulmonary involvement identified. 3. No distant extra osseous metastases.    07/30/2015 - 11/29/2015 Chemotherapy    Docetaxel, Herceptin and Perjeta every 3 weeks X6 cycles, pt tolerated moderately well, restaging scan showed excellent response     09/18/2015 Imaging    CT Head w/wo Contrast 1. No acute intracranial abnormality or significant interval change. 2. Stable minimal periventricular white matter hypoattenuation on the right. This may reflect a remote ischemic injury. 3. No evidence for metastatic disease to the brain. 4. No focal soft tissue lesion to explain the patient's tenderness.    10/30/2015 Imaging  CT Abdomen Pelvis w/ Contrast 1. Few mildly prominent fluid-filled loops of small bowel scattered throughout the abdomen, with intraluminal fluid density  within the distal colon. Given the provided history, findings are suggestive of acute enteritis/diarrheal illness. 2. No other acute intra-abdominal or pelvic process identified. 3. Widespread osseous metastatic disease, better evaluated on most recent PET-CT from 07/19/2015. No pathologic fracture or other complication. 4. 11 mm hypodensity within the left kidney. This lesion measures intermediate density, and is indeterminate. While this lesion is similar in size relative to recent studies, this is increased in size relative to prior study from 2012. Further evaluation with dedicated renal mass protocol CT and/or MRI is recommended for complete characterization.    12/2015 - 05/14/2016 Chemotherapy    Maintenance Herceptin and pejeta every 3 weeks stopped on 05/14/16 due to disease progression    12/25/2015 Imaging    Restaging PET Scan 1. Markedly improved skeletal activity, with only several faint foci of residual accentuated metabolic activity, but resolution of the vast majority of the previously extensive osseous metastatic disease. 2. Resolution of the prior right internal mammary and right prevascular lymph nodes. 3. Residual subcutaneous edema and edema along fascia planes in the left upper thigh. This remains somewhat more than I would expect for placement of an IM nail 6 weeks ago. If there is leg swelling further down, consider Doppler venous ultrasound to rule out left lower extremity DVT. I do not perceive an obvious difference in density between the pelvic and common femoral veins on the noncontrast CT data. 4. Chronic right maxillary and right sphenoid sinusitis.    01/16/2016 - 10/20/2017 Anti-estrogen oral therapy    Letrozole 2.5 mg daily. She was switched to exemestane due to disease progression on 02/08/17. Stopped on 10/20/2017 when treatment changed to chemo     03/01/2016 Miscellaneous    Patient presented to ED following a fall; she reports she landed on her back and is now  experiencing back pain. The patient was evaluated and discharge home same day with pain control.    05/01/2016 Imaging    CT CAP IMPRESSION: 1. Stable exam.  No new or progressive disease identified. 2. No evidence for residual or recurrent adenopathy within the chest. 3. Stable bone metastasis.    05/01/2016 Imaging    BONE SCAN IMPRESSION: 1. Small focal uptake involving the anterior rib ends of the left second and right fourth ribs, new since the prior bone scan. The location of this uptake is more suggestive of a traumatic or inflammatory etiology as opposed to metastatic disease. No other evidence of metastatic disease. 2. There are other areas of stable uptake which are most likely degenerative/reactive, including right shoulder and cervical spine uptake and uptake along the right femur adjacent to the ORIF hardware.    05/20/2016 Imaging    NM PET Skull to Thigh IMPRESSION: 1. There multiple metastatic foci in the bony pelvis, including some new abnormal foci compare to the prior PET-CT, compatible with mildly progressive bony metastatic disease. Also, a left T11 vertebral hypermetabolic metastatic lesion is observed, new compared to the prior exam. 2. No active extraosseous malignancy is currently identified. 3. Right anterior fourth rib healing fracture, appears be    06/04/2016 - 01/08/2017 Chemotherapy    Second line chemotherapy Kadcyla every 3 weeks started on 06/04/16 and stopped on 01/08/17 due to mixed response.      10/05/2016 Imaging    CT CAP and Bone Scan:  Bones: left intramedullary rod and left femoral  head neck screw in place. In the proximal diaphysis of the left femur there is cortical thickening and irregularity. No lytic or blastic bone lesions. No fracture or vertebral endplate destruction.   No definite evidence of recurrent disease in the chest, abdomen, or pelvis.    10/28/2016 Imaging    CT A/P IMPRESSION: No acute process demonstrated in the  abdomen or pelvis. No evidence of bowel obstruction or inflammation.    11/04/2016 Imaging    DG foot complete left: IMPRESSION: No fracture or dislocation.  No soft tissue abnormality    11/23/2016 Imaging    CT RIGHT FEMUR IMPRESSION: Negative CT scan of the right thigh.  No visible metastatic disease.  CT LEFT FEMUR IMPRESSION: 1. Postsurgical changes related to prior cephalomedullary rod fixation of the left femur with linear lucency along the anterolateral proximal femoral cortex at level of the lesser trochanter, suspicious for nondisplaced fracture. 2. Slightly permeative appearance of the proximal left femoral cortex at the site of known prior osseous metastases is largely unchanged. 3. Unchanged patchy lucency and sclerosis along the anterior acetabulum, which appears to correspond with an area of faint uptake on the prior PET-CT, suspicious for osseous metastasis. These results will be called to the ordering clinician or representative by the Radiologist Assistant, and communication documented in the PACS or zVision Dashboard.      01/27/2017 Imaging    IMPRESSION: 1. Mixed response to osseous metastasis. Some hypermetabolic lesions are new and increasingly hypermetabolic, while another index lesion is decreasingly hypermetabolic. 2. No evidence of hypermetabolic soft tissue metastasis.     02/08/2017 - 10/19/2017 Antibody Plan    -Herceptin every 3 week since 02/08/17 -Oral Lanpantinib 1562m and decreased to 5058mdue to diarrhea and depression. Due to disease progression she swithced to Verzenio 15075mn 05/04/17. She did not toelrate well so she was switched to Ibrance 100m42m 05/31/17. Due to her neutropenia, Ibrance dose reduced to 75 mg daily, 3 weeks on, one-week off, stopped on 10/20/2017 due to disease progression.     04/30/2017 PET scan    IMPRESSION: 1. Primarily increase in hypermetabolism of osseous metastasis. An isolated right acetabular lesion is  decreased in hypermetabolism since the prior exam. 2. No evidence of hypermetabolic soft tissue metastasis. 3. Similar non FDG avid right-sided thyroid nodule, favoring a benign etiology. Recommend attention on follow-up.    08/30/2017 PET scan    08/30/2017 PET Scan  IMPRESSION: 1. Worsening osseous metastatic disease. 2. Focal hypermetabolism in the central spinal canal the L1 level, similar to the prior exam. Metastatic implant cannot be excluded. 3. Slight enlargement of a right thyroid nodule which now appears more cystic in character. Consider further evaluation with thyroid ultrasound. If patient is clinically hyperthyroid, consider nuclear medicine thyroid uptake and scan    08/30/2017 Progression    08/30/2017 PET Scan  IMPRESSION: 1. Worsening osseous metastatic disease. 2. Focal hypermetabolism in the central spinal canal the L1 level, similar to the prior exam. Metastatic implant cannot be excluded. 3. Slight enlargement of a right thyroid nodule which now appears more cystic in character. Consider further evaluation with thyroid ultrasound. If patient is clinically hyperthyroid, consider nuclear medicine thyroid uptake and scan    09/29/2017 - 10/11/2017 Radiation Therapy    Pt received 27 Gy in 9 fractions directed at the areas of significant uptake noted on recent bone scan the right pelvis and right proximal femur, with Dr, KinaSondra Come 10/19/2017 - 03/25/2018 Chemotherapy    -  Herceptin every 3 weeks starting 02/08/17, perjeta added on 10/20/2017  -weekly Taxol started on 10/21/2017. Will reduce Taxol to 2 weeks on, 1 week off starting on 02/04/17.  Stoped due to mild disease progression    10/27/2017 Imaging    10/27/2017 CT AP IMPRESSION: 1. No acute abnormality. No evidence of bowel obstruction or acute bowel inflammation. Normal appendix. 2. Stable patchy sclerotic osseous metastases throughout the visualized skeleton. No new or progressive metastatic disease in  the abdomen or pelvis.    01/11/2018 Imaging    Whole Body Bone Scan 01/11/18  IMPRESSION: 1. Multifocal osseous metastases as described. 2. Right scapular metastasis. 3. Right superior thoracic spinous process. 4. Right sacral ala and right L5 metastases. 5. Right acetabular metastasis. 6. Right proximal femur metastasis. 7. Diffuse metastases throughout the left femur. 8. Anterior right fourth rib metastasis.    01/11/2018 Imaging    CT CAP W Contrast 01/11/18  IMPRESSION: 1. Similar-appearing osseous metastatic disease. 2. No evidence for progressed metastatic disease in the chest, abdomen or pelvis.    03/30/2018 Imaging    Bone Scan 03/30/18  IMPRESSION: Stable multifocal bony metastatic disease.    03/30/2018 Imaging     CT CAP W Contrast 03/30/18 IMPRESSION: 1. New mild contiguous wall thickening with associated mucosal hyperenhancement throughout the left colon and rectum, suggesting nonspecific infectious or inflammatory proctocolitis. Differential includes C diff colitis. 2. Stable faint patchy sclerosis in the iliac bones and lumbar vertebral bodies. No new or progressive osseous metastatic disease by CT. Please note that the osseous lesions are relatively occult by CT and would be better evaluated by PET-CT, as clinically warranted. 3. No evidence of extra osseous metastatic disease.  These results will be called to the ordering clinician or representative by the Radiologist Assistant, and communication documented in the PACS or zVision Dashboard.    04/15/2018 - 05/27/2018 Chemotherapy    Enhertu q3weeks starting 04/15/18. Stopped Enhertu after cycle 3 on 05/27/18 due to poor toleration.     04/21/2018 PET scan    PET 04/21/18  IMPRESSION: 1. Overall marked interval improvement in bony hypermetabolic disease. All of the previously identified hypermetabolic osseous metastases have decreased in hypermetabolism in the interval. There is a new tiny focus of  hypermetabolism in the right aspect of the L3 vertebral body that appears to correspond to a new 8 mm lucency, raising concern for new metastatic disease. Close attention on follow-up recommended. 2. Focal hypermetabolism in the central spinal canal at the level of L1 previously has resolved. 3. No hypermetabolic soft tissue disease on today's study. 4. Right thyroid nodule seen previously has decreased in the interval.     06/17/2018 -  Chemotherapy    Restart Taxol 2 weeks on/1 week off with Herceptin and Perjeta q3weeks on 06/17/18    06/17/2018 -  Chemotherapy    The patient had trastuzumab (HERCEPTIN) 357 mg in sodium chloride 0.9 % 250 mL chemo infusion, 6 mg/kg = 357 mg (100 % of original dose 6 mg/kg), Intravenous,  Once, 1 of 5 cycles Dose modification: 6 mg/kg (original dose 6 mg/kg, Cycle 1, Reason: Provider Judgment, Comment: pt recieved ENHERTO 3 weeks ago ) pertuzumab (PERJETA) 420 mg in sodium chloride 0.9 % 250 mL chemo infusion, 420 mg, Intravenous, Once, 0 of 4 cycles  for chemotherapy treatment.       CURRENT THERAPY:  Restart Taxol 2 weeks on/1 week off with Herceptin and Perjeta q3weeks on 06/17/18  INTERVAL HISTORY:  [name] is  here for a follow up and treatment. She presents to the clinic with her interpretor. She notes she is eating better and gained 4 pounds. She still has significant constant pain of left abdomen (6-7/10). She note her family thinks her left side is swollen as well. She notes when she went for CT scan she was not told she needed IV for contrast. She is taking oxycodone 99m BID.  She notes her BMs are normal, no longer diarrhea. Her neuropathy is stable and mild of feet and legs. No neuropathy of hands. She is not sure what her hip rod is made out of.     REVIEW OF SYSTEMS:   Constitutional: Denies fevers, chills or abnormal weight loss Eyes: Denies blurriness of vision Ears, nose, mouth, throat, and face: Denies mucositis or sore  throat Respiratory: Denies cough, dyspnea or wheezes Cardiovascular: Denies palpitation, chest discomfort or lower extremity swelling Gastrointestinal:  Denies nausea, heartburn or change in bowel habits (+) throbbing left abdominal pain  Skin: Denies abnormal skin rashes MSK: (+) Left lower back pain (6-7/10) Lymphatics: Denies new lymphadenopathy or easy bruising Neurological: (+) Neuropathy is stable and mild in feet and LE Behavioral/Psych: Mood is stable, no new changes  All other systems were reviewed with the patient and are negative.  MEDICAL HISTORY:  Past Medical History:  Diagnosis Date   Abnormal Pap smear    Anxiety    [place]   breast-s/p mastectomy, chemo, rad   CIN I (cervical intraepithelial neoplasia I) 2003   by colpo   Congenital deafness    Deaf    Depression    Port-A-Cath in place 2017   for chemotherapy   Radiation 07/12/15-07/26/15   left femur 30 Gy   S/P bilateral oophorectomy     SURGICAL HISTORY: Past Surgical History:  Procedure Laterality Date   ABDOMINAL HYSTERECTOMY     INTRAMEDULLARY (IM) NAIL INTERTROCHANTERIC Left 06/26/2015   Procedure: LEFT BIOMET LONG AFFIXS NAIL;  Surgeon: MMarybelle Killings MD;  [facility]: [facility]  Service: Orthopedics;  Laterality: Left;   IR GENERIC HISTORICAL  08/06/2015   IR [place] VASC ACCESS LEFT 08/06/2015 GAletta Edouard MD [facility]   IR GENERIC HISTORICAL  08/06/2015   IR FLUORO GUIDE CV LINE LEFT 08/06/2015 GAletta Edouard MD [facility]   IR GENERIC HISTORICAL  03/05/2016   IR CV LINE INJECTION 03/05/2016 AMarkus Daft MD [facility]   LEFT HEART CATH AND CORONARY ANGIOGRAPHY N/A 12/13/2017   Procedure: LEFT HEART CATH AND CORONARY ANGIOGRAPHY;  Surgeon: HLeonie Man MD;  [facility];  Service: Cardiovascular;  Laterality: N/A;   MASTECTOMY     right breast   MASTECTOMY     OVARIAN CYST REMOVAL     RADIOLOGY WITH ANESTHESIA Left  06/25/2015   Procedure: MRI OF LEFT HIP WITH OR WITHOUT CONTRAST;  Surgeon: Medication Radiologist, MD;  [facility]  Service: Radiology;  Laterality: Left;  DR. MCINTYRE/MRI   TUBAL LIGATION      I have reviewed the social history and family history with the patient and they are unchanged from previous note.  ALLERGIES:  is allergic to chlorhexidine; promethazine hcl; and diphenhydramine hcl.  MEDICATIONS:  Current Outpatient Medications  Medication Sig Dispense Refill   albuterol (PROVENTIL HFA;VENTOLIN HFA) 108 (90 Base) MCG/ACT inhaler Inhale 2 puffs into the lungs every 6 (six) hours as needed for wheezing or shortness of breath. 1 Inhaler 0  ALPRAZolam (XANAX) 0.25 MG tablet Take 1 tablet (0.25 mg total) by mouth 2 (two) times daily as needed for anxiety. 30 tablet 0   dexamethasone (DECADRON) 4 MG tablet Take 1 tablet (4 mg total) by mouth daily. 20 tablet 0   diphenoxylate-atropine (LOMOTIL) 2.5-0.025 MG tablet Take 1-2 tablets by mouth 4 (four) times daily as needed for diarrhea or loose stools. 30 tablet 1   lidocaine-prilocaine (EMLA) cream Apply 1 application topically as needed. Apply to Porta-Cath 1-2 hours prior to access as directed. 30 g 2   methocarbamol (ROBAXIN) 500 MG tablet Take 1 tablet (500 mg total) by mouth 2 (two) times daily. 20 tablet 0   omeprazole (PRILOSEC) 20 MG capsule Take 1 capsule (20 mg total) by mouth daily. 30 capsule 0   ondansetron (ZOFRAN) 8 MG tablet Take 1 tablet (8 mg total) by mouth every 8 (eight) hours as needed for nausea or vomiting. 45 tablet 1   oxyCODONE (ROXICODONE) 5 MG immediate release tablet Take 1-2 tablets (5-10 mg total) by mouth every 6 (six) hours as needed for severe pain. 60 tablet 0   potassium chloride (K-DUR) 10 MEQ tablet Take 2 tablets (20 mEq total) by mouth 2 (two) times daily. 180 tablet 3   prochlorperazine (COMPAZINE) 10 MG tablet Take 1 tablet (10 mg total) by mouth every 6 (six) hours as needed for  nausea or vomiting. 60 tablet 2   zolpidem (AMBIEN) 5 MG tablet Take 1.5 tablets (7.5 mg total) by mouth at bedtime as needed for sleep. 45 tablet 0   No current facility-administered medications for this visit.    Facility-Administered Medications Ordered in Other Visits  Medication Dose Route Frequency Provider Last Rate Last Dose   acetaminophen (TYLENOL) tablet 650 mg  650 mg Oral Once Truitt Merle, MD       dexamethasone (DECADRON) injection 10 mg  10 mg Intravenous Once Truitt Merle, MD       diphenhydrAMINE (BENADRYL) injection 25 mg  25 mg Intravenous Once Truitt Merle, MD       famotidine (PEPCID) IVPB 20 mg premix  20 mg Intravenous Once Truitt Merle, MD       heparin lock flush 100 unit/mL  500 Units Intracatheter Once PRN Truitt Merle, MD       PACLitaxel (TAXOL) 96 mg in sodium chloride 0.9 % 250 mL chemo infusion (</= '80mg'$ /m2)  60 mg/m2 (Treatment Plan Recorded) Intravenous Once Truitt Merle, MD       sodium chloride 0.9 % 1,000 mL with potassium chloride 10 mEq infusion   Intravenous Continuous Livesay, Tamala Julian, MD   Stopped at 09/10/15 1653   sodium chloride 0.9 % injection 10 mL  10 mL Intravenous PRN Livesay, Lennis P, MD       sodium chloride flush (NS) 0.9 % injection 10 mL  10 mL Intracatheter PRN Truitt Merle, MD       trastuzumab (HERCEPTIN) 357 mg in sodium chloride 0.9 % 250 mL chemo infusion  6 mg/kg (Treatment Plan Recorded) Intravenous Once Truitt Merle, MD        PHYSICAL EXAMINATION: ECOG PERFORMANCE STATUS: 2 - Symptomatic, <50% confined to bed  Vitals:   06/17/18 0827  BP: 116/63  Pulse: 85  Resp: 18  Temp: 98 F (36.7 C)  SpO2: 100%   Filed Weights   06/17/18 0827  Weight: 128 lb 1.6 oz (58.1 kg)    GENERAL:alert, no distress and comfortable SKIN: skin color, texture, turgor are normal, no rashes or  significant lesions EYES: normal, Conjunctiva are pink and non-injected, sclera clear  NECK: supple, thyroid normal size, non-tender, without nodularity LYMPH:   no palpable lymphadenopathy in the cervical, axillary  LUNGS: clear to auscultation and percussion with normal breathing effort HEART: regular rate & rhythm and no murmurs and no lower extremity edema ABDOMEN:abdomen soft and normal bowel sounds (+) Left abdominal tenderness  Musculoskeletal:no cyanosis of digits and no clubbing, symmetrical (+) tenderness of Left lower back  NEURO: alert & oriented x 3 with fluent speech, no focal motor/sensory deficits  LABORATORY DATA:  I have reviewed the data as listed CBC Latest Ref Rng & Units 06/17/2018 06/02/2018 05/27/2018  WBC 4.0 - 10.5 K/uL 2.4(L) 9.7 3.3(L)  Hemoglobin 12.0 - 15.0 g/dL 10.9(L) 13.1 11.6(L)  Hematocrit 36.0 - 46.0 % 33.0(L) 38.0 33.9(L)  Platelets 150 - 400 K/uL 151 254 189     CMP Latest Ref Rng & Units 06/17/2018 06/02/2018 05/27/2018  Glucose 70 - 99 mg/dL 82 96 92  BUN 6 - 20 mg/dL 6 25(H) 8  Creatinine 0.44 - 1.00 mg/dL 0.75 0.84 0.76  Sodium 135 - 145 mmol/L 142 135 141  Potassium 3.5 - 5.1 mmol/L 3.5 4.4 3.2(L)  Chloride 98 - 111 mmol/L 106 98 103  CO2 22 - 32 mmol/L _0 Calcium 8.9 - 10.3 mg/dL 8.8(L) 9.2 9.4  Total Protein 6.5 - 8.1 g/dL 5.9(L) 6.9 6.4(L)  Total Bilirubin 0.3 - 1.2 mg/dL 1.1 1.7(H) 1.7(H)  Alkaline Phos 38 - 126 U/L 55 62 62  AST 15 - 41 U/L 17 14(L) 19  ALT 0 - 44 U/L _1 RADIOGRAPHIC STUDIES: I have personally reviewed the radiological images as listed and agreed with the findings in the report. Ct Lumbar Spine Wo Contrast  Result Date: 06/16/2018 CLINICAL DATA:  53 year old female with right breast cancer. Bone metastases. Three weeks of low back pain. EXAM: CT LUMBAR SPINE WITHOUT CONTRAST TECHNIQUE: Multidetector CT imaging of the lumbar spine was performed without intravenous contrast administration. Multiplanar CT image reconstructions were also generated. COMPARISON:  PET-CT 04/21/2018 and earlier. FINDINGS: Segmentation: Transitional lumbosacral anatomy. When numbering  from the cervicothoracic junction on prior studies there is a partially sacralized L5 level (on the left) with lowest ribs at T12. Correlation with radiographs is recommended prior to any operative intervention. Alignment: Stable lumbar lordosis. No spondylolisthesis. Vertebrae: A right L3 vertebral body metastasis is suspected based on the April PET-CT but not evident by CT. No destructive osseous lesion identified from T12 through L4. Metastatic versus degenerative superior endplate irregularity of L5 on the right appears stable since March (sagittal image 26). Heterogeneity of bilateral sacral bone mineralization appears stable since March. There is a 10 millimeter lucent lesion of the right iliac bone on series 4, image 67 which has increased since March. No other lytic metastasis identified. Paraspinal and other soft tissues: Surgically absent gallbladder. Negative visible noncontrast abdominal and pelvic viscera. Negative visualized posterior paraspinal soft tissues. Disc levels: T12-L1:  Negative. L1-L2:  Negative. L2-L3: Asymmetric appearance of the left neural foramen and subarticular segment of the disc on series 3, image 38 suspicious for disc herniation. This seems to spare the left lateral recess. Suspected left L2 foraminal stenosis. L3-L4: Circumferential disc bulge eccentric to the left. Mild endplate spurring. Mild to moderate left L3 foraminal stenosis. This appears stable since March. L4-L5: Right eccentric circumferential disc bulge. Mild facet hypertrophy. Mild endplate spurring. Mild to moderate right L4 foraminal  stenosis and up to mild right lateral recess stenosis appears stable since March. L5-S1:  Partially sacralized and negative. IMPRESSION: 1. Transitional lumbosacral anatomy. Correlation with radiographs is recommended prior to any operative intervention. 2. Suspicion of a right L3 vertebral body metastasis on the April PET-CT is not evident by CT. Stable degenerative versus metastatic  lesion at the right L5 superior endplate. Small but increased right iliac bone metastasis since March. Lumbar MRI would best characterize lumbar metastatic disease (without contrast if necessary). 3. Suspect L2-L3 left subarticular segment disc herniation. Query left L2 radiculitis. 4. Stable appearance of rightward L4-L5 disc degeneration which could affect the right foramen and right lateral recess. Electronically Signed   By: Genevie Ann M.D.   On: 06/16/2018 20:18     ASSESSMENT & PLAN:  REONNA FINLAYSON is a 53 y.o. 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 Vernia Buff recently onweekly Taxol, Herceptin and Perjetaq3weeks,tolerated moderately well, had overall excellent response based on PET in 04/2018  -She startedEnhertuq3weekson4/3/20. She toleratedmoderatelywell with mild diarrhea and significantlydelayed nausea and vomitingafter cycle 2.Reduced to from 346m to 2010mfor cycle 3 but had severe diarrhea, nausea and anorexia. Meantime she also developed severe low back/left pelvic pain  -Her Lumbar CT scan from yesterday was done WO contrast. I reviewed the images myself and discussed the findings with her. It showed no significant change in spine but slight increase in right pelvic lesion.  -She has left lower back and left abdomen on exam today. I discussed her pain could be from her pelvic bone mets, or less likely colitis and diarrhea. Her CT scan did not show the whole pelvis and given slight increase of her right pelvic lesions, I recommend PET scan for better evaluation, and decide if she needs palliative radiation. She is agreeable.  -I also discussed her poor toleration of Enhertu and recent worsening pain, I recommend changing her back to chemo therapy  Taxol/herceptin/perjeta. I recommend restarting Taxol 2 weeks on/1 week off with Herceptin and Perjeta q3weeks. She is agreeable.  -Her neuropathy is currently mild and stable in her feet and LE, will monitor on treatment.  -If her pain improves with chemo, then we can hold on palliative radiation.  -Labs reviewed, CBC and CMP WNL except WBC 2.4, Hg 10.9, ANC 1.2, Ca 8.8., protein 5.9, albumin 3.2. CA 27.29 still pending. Overall adequate to proceed with Taxol, Herceptin today, will start Perjeta on next cycle.  -F/u in 3 weeks   2.left low back pain -started about 5 days ago, went to ED, x-ray was negative for fracture or acute changes  -PET scan one month ago was negative for hypermetabolic lesion in that area  -She has tenderness at left side lumbar paraspinal tenderness, no other neurological deficits, she limps when she walks. This is likely muscular pain -I previously increased her Robaxin and she will continue Oxycodone 1079mID currently as neededto controlher pain. -Will obtain Lumbar Spine CT from yesterday was mostly stable with slight increase of right pelvic lesion. Will obtain PET for further evaluation.  -Exam today was normal (06/17/18). I encouraged her to exercise to help her pain.  -If pain improved with chemo, I do not plan to refer her for palliative radiation.   3. Body pain, secondary to bone mets -since her cancer diagnosis,she has had b/l hip,  shoulder and chest painat different time points, likely related to her bone mets -overall improved and manageable  -Ipreviouslyprescribed herGabapentin, but she decided not to take due to potential side effect of suicidal thoughts. -Stable, continue pain meds  4.Congenital deafness  5.Mild anemia, secondary to chemotherapy and malignancy -She will start with oral iron daily for 1-2 months and then stop. She will also start daily women's vitamin. -Hg at 10.9(06/17/18)   6.Hypokalemia -He has intermittent  hyperkalemia, on oral supplementKCL. -N&Vsubsided and diarrhea is improving. -Ipreviouslyinstructed her to reduce dairy products in diet. -Knormal at 3.5(06/17/18).Continue Oral Potassium BID.   7.Goal of care discussion -she understand her goal of care is palliative to prolong her life -she is full code for now  8.Insomnia -She cannot take Benadryl due to allergy reaction. She has tried trazodone with no relief.  -Her insomnia is not currently controlled with 64m Ambien. Ipreviouslyincrease her to7.559mat night. -Due to recent shoulder and hip pain she hashad toincrease Ambien to 1046mo help her fall asleep.I discussed that is the highest dose and she should not use 95m74mery night.  9. Anorexia, weight loss  -With Enhertu she has had taste change which lead to her not eating adequately and a 5 pound weight loss in 1 week.  -I strongly encouraged her to increase protein, calorie and overall food consumption to gain weight along with supplements. I also encouraged her to drink at least 40 ounces of liquid a day.  -She has been eating more lately and was able to gain 4 pounds since last visit.  -Will change Enhertu to Taxol/Herceptin/Perjeta on 06/17/18    PLAN: -CT lumbar spine reviewed with pt, will order PET scan or further evaluation  -Lab reviewed, overall adequate to proceed with Taxol, Herceptin today  -Please cancel future scheduled appointments -Lab, flush and Taxol in 1, 3 and 4 weeks  -F/u and Herceptin and Perjeta in 3 weeks  -PET in 2 weeks    No problem-specific Assessment & Plan notes found for this encounter.   Orders Placed This Encounter  Procedures   NM PET Image Restag (PS) Skull Base To Thigh    Standing Status:   Future    Standing Expiration Date:   06/17/2019    Order Specific Question:   If indicated for the ordered procedure, I authorize the administration of a radiopharmaceutical per Radiology protocol    Answer:   Yes     Order Specific Question:   Is the patient pregnant?    Answer:   No    Order Specific Question:   Preferred imaging location?    [facility]:   Radiology Contrast Protocol - do NOT remove file path    Answer:   \[email or web address]   All questions were answered. The patient knows to call the clinic with any problems, questions or concerns. No barriers to learning was detected. I spent 30 minutes counseling the patient face to face. The total time spent in the appointment was 40 minutes and more than 50% was on counseling and review of test results     Ashtyn Meland Truitt Merle 06/17/2018   I, AmoyJoslyn Devon acting as scribe for Ladelle Teodoro Truitt Merle.   I have reviewed the above documentation for accuracy and completeness, and I agree with the above.

## 2018-06-16 ENCOUNTER — Telehealth: Payer: Self-pay | Admitting: *Deleted

## 2018-06-16 ENCOUNTER — Other Ambulatory Visit: Payer: Self-pay

## 2018-06-16 ENCOUNTER — Other Ambulatory Visit: Payer: Self-pay | Admitting: Hematology

## 2018-06-16 ENCOUNTER — Ambulatory Visit
Admission: RE | Admit: 2018-06-16 | Discharge: 2018-06-16 | Disposition: A | Discharge status: Home / Self Care | Payer: Medicaid Other | Source: Ambulatory Visit | Attending: Hematology | Admitting: Hematology

## 2018-06-16 DIAGNOSIS — C50911 Malignant neoplasm of unspecified site of right female breast: Secondary | ICD-10-CM

## 2018-06-16 DIAGNOSIS — M545 Low back pain: Secondary | ICD-10-CM | POA: Diagnosis not present

## 2018-06-16 DIAGNOSIS — C7951 Secondary malignant neoplasm of bone: Secondary | ICD-10-CM

## 2018-06-16 NOTE — Telephone Encounter (Signed)
"  [name] with [facility] calling for order to scan without contrast.  [name] here now.  Says she is not supposed to receive and refusing contrast."

## 2018-06-17 ENCOUNTER — Inpatient Hospital Stay: Payer: Medicaid Other

## 2018-06-17 ENCOUNTER — Encounter: Payer: Self-pay | Admitting: Hematology

## 2018-06-17 ENCOUNTER — Inpatient Hospital Stay: Payer: Medicaid Other | Attending: Hematology

## 2018-06-17 ENCOUNTER — Other Ambulatory Visit: Payer: Self-pay

## 2018-06-17 ENCOUNTER — Inpatient Hospital Stay (HOSPITAL_BASED_OUTPATIENT_CLINIC_OR_DEPARTMENT_OTHER): Payer: Medicaid Other | Admitting: Hematology

## 2018-06-17 VITALS — BP 116/63 | HR 85 | Temp 98.0°F | Resp 18 | Ht 65.0 in | Wt 128.1 lb

## 2018-06-17 DIAGNOSIS — D6481 Anemia due to antineoplastic chemotherapy: Secondary | ICD-10-CM

## 2018-06-17 DIAGNOSIS — C50911 Malignant neoplasm of unspecified site of right female breast: Secondary | ICD-10-CM

## 2018-06-17 DIAGNOSIS — F419 Anxiety disorder, unspecified: Secondary | ICD-10-CM | POA: Diagnosis not present

## 2018-06-17 DIAGNOSIS — H905 Unspecified sensorineural hearing loss: Secondary | ICD-10-CM | POA: Diagnosis not present

## 2018-06-17 DIAGNOSIS — E876 Hypokalemia: Secondary | ICD-10-CM | POA: Insufficient documentation

## 2018-06-17 DIAGNOSIS — E875 Hyperkalemia: Secondary | ICD-10-CM | POA: Diagnosis not present

## 2018-06-17 DIAGNOSIS — Z79899 Other long term (current) drug therapy: Secondary | ICD-10-CM | POA: Insufficient documentation

## 2018-06-17 DIAGNOSIS — G4700 Insomnia, unspecified: Secondary | ICD-10-CM | POA: Insufficient documentation

## 2018-06-17 DIAGNOSIS — F329 Major depressive disorder, single episode, unspecified: Secondary | ICD-10-CM | POA: Insufficient documentation

## 2018-06-17 DIAGNOSIS — C7951 Secondary malignant neoplasm of bone: Secondary | ICD-10-CM

## 2018-06-17 DIAGNOSIS — Z5112 Encounter for antineoplastic immunotherapy: Secondary | ICD-10-CM | POA: Diagnosis not present

## 2018-06-17 DIAGNOSIS — Z923 Personal history of irradiation: Secondary | ICD-10-CM | POA: Insufficient documentation

## 2018-06-17 DIAGNOSIS — Z170 Estrogen receptor positive status [ER+]: Secondary | ICD-10-CM

## 2018-06-17 DIAGNOSIS — R630 Anorexia: Secondary | ICD-10-CM | POA: Insufficient documentation

## 2018-06-17 DIAGNOSIS — R634 Abnormal weight loss: Secondary | ICD-10-CM

## 2018-06-17 DIAGNOSIS — Z9221 Personal history of antineoplastic chemotherapy: Secondary | ICD-10-CM | POA: Insufficient documentation

## 2018-06-17 DIAGNOSIS — Z79811 Long term (current) use of aromatase inhibitors: Secondary | ICD-10-CM | POA: Insufficient documentation

## 2018-06-17 DIAGNOSIS — Z95828 Presence of other vascular implants and grafts: Secondary | ICD-10-CM

## 2018-06-17 DIAGNOSIS — C799 Secondary malignant neoplasm of unspecified site: Secondary | ICD-10-CM

## 2018-06-17 DIAGNOSIS — C50919 Malignant neoplasm of unspecified site of unspecified female breast: Secondary | ICD-10-CM

## 2018-06-17 LAB — CBC WITH DIFFERENTIAL (CANCER CENTER ONLY)
Abs Immature Granulocytes: 0.02 10*3/uL (ref 0.00–0.07)
Basophils Absolute: 0 10*3/uL (ref 0.0–0.1)
Basophils Relative: 0 %
Eosinophils Absolute: 0.1 10*3/uL (ref 0.0–0.5)
Eosinophils Relative: 3 %
HCT: 33 % — ABNORMAL LOW (ref 36.0–46.0)
Hemoglobin: 10.9 g/dL — ABNORMAL LOW (ref 12.0–15.0)
Immature Granulocytes: 1 %
Lymphocytes Relative: 35 %
Lymphs Abs: 0.8 10*3/uL (ref 0.7–4.0)
MCH: 29.9 pg (ref 26.0–34.0)
MCHC: 33 g/dL (ref 30.0–36.0)
MCV: 90.7 fL (ref 80.0–100.0)
Monocytes Absolute: 0.3 10*3/uL (ref 0.1–1.0)
Monocytes Relative: 11 %
Neutro Abs: 1.2 10*3/uL — ABNORMAL LOW (ref 1.7–7.7)
Neutrophils Relative %: 50 %
Platelet Count: 151 10*3/uL (ref 150–400)
RBC: 3.64 MIL/uL — ABNORMAL LOW (ref 3.87–5.11)
RDW: 14.8 % (ref 11.5–15.5)
WBC Count: 2.4 10*3/uL — ABNORMAL LOW (ref 4.0–10.5)
nRBC: 0 % (ref 0.0–0.2)

## 2018-06-17 LAB — CMP (CANCER CENTER ONLY)
ALT: 18 U/L (ref 0–44)
AST: 17 U/L (ref 15–41)
Albumin: 3.2 g/dL — ABNORMAL LOW (ref 3.5–5.0)
Alkaline Phosphatase: 55 U/L (ref 38–126)
Anion gap: 10 (ref 5–15)
BUN: 6 mg/dL (ref 6–20)
CO2: 26 mmol/L (ref 22–32)
Calcium: 8.8 mg/dL — ABNORMAL LOW (ref 8.9–10.3)
Chloride: 106 mmol/L (ref 98–111)
Creatinine: 0.75 mg/dL (ref 0.44–1.00)
GFR, Est AFR Am: >60 mL/min (ref >60)
GFR, Estimated: >60 mL/min (ref >60)
Glucose, Bld: 82 mg/dL (ref 70–99)
Potassium: 3.5 mmol/L (ref 3.5–5.1)
Sodium: 142 mmol/L (ref 135–145)
Total Bilirubin: 1.1 mg/dL (ref 0.3–1.2)
Total Protein: 5.9 g/dL — ABNORMAL LOW (ref 6.5–8.1)

## 2018-06-17 MED ORDER — ACETAMINOPHEN 325 MG PO TABS
650.0000 mg | ORAL_TABLET | Freq: Once | ORAL | Status: AC
  Administered 2018-06-17: 650 mg via ORAL

## 2018-06-17 MED ORDER — DEXAMETHASONE SODIUM PHOSPHATE 10 MG/ML IJ SOLN
10.0000 mg | Freq: Once | INTRAMUSCULAR | Status: AC
  Administered 2018-06-17: 10 mg via INTRAVENOUS

## 2018-06-17 MED ORDER — SODIUM CHLORIDE 0.9% FLUSH
10.0000 mL | INTRAVENOUS | Status: DC | PRN
  Administered 2018-06-17: 10 mL
  Filled 2018-06-17: qty 10

## 2018-06-17 MED ORDER — TRASTUZUMAB CHEMO 150 MG IV SOLR
6.0000 mg/kg | Freq: Once | INTRAVENOUS | Status: AC
  Administered 2018-06-17: 357 mg via INTRAVENOUS
  Filled 2018-06-17: qty 17

## 2018-06-17 MED ORDER — SODIUM CHLORIDE 0.9% FLUSH
10.0000 mL | Freq: Once | INTRAVENOUS | Status: AC
  Administered 2018-06-17: 10 mL
  Filled 2018-06-17: qty 10

## 2018-06-17 MED ORDER — FAMOTIDINE IN NACL 20-0.9 MG/50ML-% IV SOLN
20.0000 mg | Freq: Once | INTRAVENOUS | Status: AC
  Administered 2018-06-17: 20 mg via INTRAVENOUS

## 2018-06-17 MED ORDER — SODIUM CHLORIDE 0.9 % IV SOLN
Freq: Once | INTRAVENOUS | Status: AC
  Administered 2018-06-17: 10:00:00 via INTRAVENOUS
  Filled 2018-06-17: qty 250

## 2018-06-17 MED ORDER — FAMOTIDINE IN NACL 20-0.9 MG/50ML-% IV SOLN
INTRAVENOUS | Status: AC
  Filled 2018-06-17: qty 50

## 2018-06-17 MED ORDER — HEPARIN SOD (PORK) LOCK FLUSH 100 UNIT/ML IV SOLN
500.0000 [IU] | Freq: Once | INTRAVENOUS | Status: AC | PRN
  Administered 2018-06-17: 500 [IU]
  Filled 2018-06-17: qty 5

## 2018-06-17 MED ORDER — DIPHENHYDRAMINE HCL 50 MG/ML IJ SOLN: 25.0000 mg | Freq: Once | INTRAMUSCULAR | Status: DC

## 2018-06-17 MED ORDER — ACETAMINOPHEN 325 MG PO TABS
ORAL_TABLET | ORAL | Status: AC
  Filled 2018-06-17: qty 2

## 2018-06-17 MED ORDER — SODIUM CHLORIDE 0.9 % IV SOLN
60.0000 mg/m2 | Freq: Once | INTRAVENOUS | Status: AC
  Administered 2018-06-17: 96 mg via INTRAVENOUS
  Filled 2018-06-17: qty 16

## 2018-06-17 MED ORDER — DEXAMETHASONE SODIUM PHOSPHATE 10 MG/ML IJ SOLN
INTRAMUSCULAR | Status: AC
  Filled 2018-06-17: qty 1

## 2018-06-17 NOTE — Patient Instructions (Signed)
[facility] Discharge Instructions for Patients Receiving Chemotherapy  Today you received the following chemotherapy agents Taxol and Herceptin  To help prevent nausea and vomiting after your treatment, we encourage you to take your nausea medication as directed   If you develop nausea and vomiting that is not controlled by your nausea medication, call the clinic.   BELOW ARE SYMPTOMS THAT SHOULD BE REPORTED IMMEDIATELY:  *FEVER GREATER THAN 100.5 F  *CHILLS WITH OR WITHOUT FEVER  NAUSEA AND VOMITING THAT IS NOT CONTROLLED WITH YOUR NAUSEA MEDICATION  *UNUSUAL SHORTNESS OF BREATH  *UNUSUAL BRUISING OR BLEEDING  TENDERNESS IN MOUTH AND THROAT WITH OR WITHOUT PRESENCE OF ULCERS  *URINARY PROBLEMS  *BOWEL PROBLEMS  UNUSUAL RASH Items with * indicate a potential emergency and should be followed up as soon as possible.  Feel free to call the clinic should you have any questions or concerns. The clinic phone number is [phone].  Please show the [facility] at check-in to the Emergency Department and triage nurse.

## 2018-06-17 NOTE — Progress Notes (Signed)
Ok to treat with [place] today per MD Burr Medico.  Also we will do ONLY Herceptin and Taxol today per MD and patient will return at a later time for the re-load of Perjeta. Patient and interpreter aware and Maggie in pharmacy.

## 2018-06-18 LAB — CANCER ANTIGEN 27.29: CA 27.29: 37.6 U/mL (ref 0.0–38.6)

## 2018-06-22 ENCOUNTER — Other Ambulatory Visit: Payer: Self-pay | Admitting: Hematology

## 2018-06-22 DIAGNOSIS — Z95828 Presence of other vascular implants and grafts: Secondary | ICD-10-CM

## 2018-06-22 DIAGNOSIS — C50911 Malignant neoplasm of unspecified site of right female breast: Secondary | ICD-10-CM

## 2018-06-22 DIAGNOSIS — C7951 Secondary malignant neoplasm of bone: Secondary | ICD-10-CM

## 2018-06-23 ENCOUNTER — Telehealth: Payer: Self-pay | Admitting: Hematology

## 2018-06-23 NOTE — Telephone Encounter (Signed)
Left message re 6/18 echo appointment via relay service. Schedule mailed.

## 2018-06-24 ENCOUNTER — Inpatient Hospital Stay: Payer: Medicaid Other

## 2018-06-24 ENCOUNTER — Other Ambulatory Visit: Payer: Self-pay | Admitting: *Deleted

## 2018-06-24 ENCOUNTER — Other Ambulatory Visit: Payer: Self-pay

## 2018-06-24 VITALS — BP 109/66 | HR 76 | Temp 98.0°F | Resp 18

## 2018-06-24 DIAGNOSIS — C50911 Malignant neoplasm of unspecified site of right female breast: Secondary | ICD-10-CM

## 2018-06-24 DIAGNOSIS — C7951 Secondary malignant neoplasm of bone: Secondary | ICD-10-CM

## 2018-06-24 DIAGNOSIS — Z5112 Encounter for antineoplastic immunotherapy: Secondary | ICD-10-CM | POA: Diagnosis not present

## 2018-06-24 LAB — CMP (CANCER CENTER ONLY)
ALT: 17 U/L (ref 0–44)
AST: 15 U/L (ref 15–41)
Albumin: 3.2 g/dL — ABNORMAL LOW (ref 3.5–5.0)
Alkaline Phosphatase: 52 U/L (ref 38–126)
Anion gap: 9 (ref 5–15)
BUN: 11 mg/dL (ref 6–20)
CO2: 24 mmol/L (ref 22–32)
Calcium: 8.6 mg/dL — ABNORMAL LOW (ref 8.9–10.3)
Chloride: 105 mmol/L (ref 98–111)
Creatinine: 0.79 mg/dL (ref 0.44–1.00)
GFR, Est AFR Am: >60 mL/min (ref >60)
GFR, Estimated: >60 mL/min (ref >60)
Glucose, Bld: 91 mg/dL (ref 70–99)
Potassium: 3.6 mmol/L (ref 3.5–5.1)
Sodium: 138 mmol/L (ref 135–145)
Total Bilirubin: 1.2 mg/dL (ref 0.3–1.2)
Total Protein: 6.2 g/dL — ABNORMAL LOW (ref 6.5–8.1)

## 2018-06-24 LAB — CBC WITH DIFFERENTIAL (CANCER CENTER ONLY)
Abs Immature Granulocytes: 0.14 10*3/uL — ABNORMAL HIGH (ref 0.00–0.07)
Basophils Absolute: 0 10*3/uL (ref 0.0–0.1)
Basophils Relative: 0 %
Eosinophils Absolute: 0.1 10*3/uL (ref 0.0–0.5)
Eosinophils Relative: 3 %
HCT: 30 % — ABNORMAL LOW (ref 36.0–46.0)
Hemoglobin: 10.1 g/dL — ABNORMAL LOW (ref 12.0–15.0)
Immature Granulocytes: 5 %
Lymphocytes Relative: 31 %
Lymphs Abs: 0.8 10*3/uL (ref 0.7–4.0)
MCH: 30.4 pg (ref 26.0–34.0)
MCHC: 33.7 g/dL (ref 30.0–36.0)
MCV: 90.4 fL (ref 80.0–100.0)
Monocytes Absolute: 0.3 10*3/uL (ref 0.1–1.0)
Monocytes Relative: 10 %
Neutro Abs: 1.3 10*3/uL — ABNORMAL LOW (ref 1.7–7.7)
Neutrophils Relative %: 51 %
Platelet Count: 206 10*3/uL (ref 150–400)
RBC: 3.32 MIL/uL — ABNORMAL LOW (ref 3.87–5.11)
RDW: 14.6 % (ref 11.5–15.5)
WBC Count: 2.6 10*3/uL — ABNORMAL LOW (ref 4.0–10.5)
nRBC: 0 % (ref 0.0–0.2)

## 2018-06-24 MED ORDER — SODIUM CHLORIDE 0.9 % IV SOLN
Freq: Once | INTRAVENOUS | Status: AC
  Administered 2018-06-24: 10:00:00 via INTRAVENOUS
  Filled 2018-06-24: qty 250

## 2018-06-24 MED ORDER — DIPHENHYDRAMINE HCL 50 MG/ML IJ SOLN: 25.0000 mg | Freq: Once | INTRAMUSCULAR | Status: DC

## 2018-06-24 MED ORDER — DEXAMETHASONE SODIUM PHOSPHATE 10 MG/ML IJ SOLN
10.0000 mg | Freq: Once | INTRAMUSCULAR | Status: AC
  Administered 2018-06-24: 10 mg via INTRAVENOUS

## 2018-06-24 MED ORDER — FAMOTIDINE IN NACL 20-0.9 MG/50ML-% IV SOLN
20.0000 mg | Freq: Once | INTRAVENOUS | Status: AC
  Administered 2018-06-24: 20 mg via INTRAVENOUS

## 2018-06-24 MED ORDER — SODIUM CHLORIDE 0.9% FLUSH
10.0000 mL | INTRAVENOUS | Status: DC | PRN
  Administered 2018-06-24: 10 mL
  Filled 2018-06-24: qty 10

## 2018-06-24 MED ORDER — HEPARIN SOD (PORK) LOCK FLUSH 100 UNIT/ML IV SOLN
500.0000 [IU] | Freq: Once | INTRAVENOUS | Status: AC | PRN
  Administered 2018-06-24: 500 [IU]
  Filled 2018-06-24: qty 5

## 2018-06-24 MED ORDER — DIPHENHYDRAMINE HCL 50 MG/ML IJ SOLN
INTRAMUSCULAR | Status: AC
  Filled 2018-06-24: qty 1

## 2018-06-24 MED ORDER — DEXAMETHASONE SODIUM PHOSPHATE 10 MG/ML IJ SOLN
INTRAMUSCULAR | Status: AC
  Filled 2018-06-24: qty 1

## 2018-06-24 MED ORDER — FAMOTIDINE IN NACL 20-0.9 MG/50ML-% IV SOLN
INTRAVENOUS | Status: AC
  Filled 2018-06-24: qty 50

## 2018-06-24 MED ORDER — SODIUM CHLORIDE 0.9 % IV SOLN
60.0000 mg/m2 | Freq: Once | INTRAVENOUS | Status: AC
  Administered 2018-06-24: 96 mg via INTRAVENOUS
  Filled 2018-06-24: qty 16

## 2018-06-24 NOTE — Progress Notes (Signed)
Ok to start premeds without waiting on CMET results per Dr. Burr Medico.

## 2018-06-24 NOTE — Patient Instructions (Signed)
Paclitaxel injection What is this medicine? PACLITAXEL (PAK li TAX el) is a chemotherapy drug. It targets fast dividing cells, like cancer cells, and causes these cells to die. This medicine is used to treat ovarian cancer, breast cancer, lung cancer, Kaposi's sarcoma, and other cancers. This medicine may be used for other purposes; ask your health care provider or pharmacist if you have questions. COMMON BRAND NAME(S): Onxol, Taxol What should I tell my health care provider before I take this medicine? They need to know if you have any of these conditions: -history of irregular heartbeat -liver disease -low blood counts, like low white cell, platelet, or red cell counts -lung or breathing disease, like asthma -tingling of the fingers or toes, or other nerve disorder -an unusual or allergic reaction to paclitaxel, alcohol, polyoxyethylated castor oil, other chemotherapy, other medicines, foods, dyes, or preservatives -pregnant or trying to get pregnant -breast-feeding How should I use this medicine? This drug is given as an infusion into a vein. It is administered in a hospital or clinic by a specially trained health care professional. Talk to your pediatrician regarding the use of this medicine in children. Special care may be needed. Overdosage: If you think you have taken too much of this medicine contact a poison control center or emergency room at once. NOTE: This medicine is only for you. Do not share this medicine with others. What if I miss a dose? It is important not to miss your dose. Call your doctor or health care professional if you are unable to keep an appointment. What may interact with this medicine? Do not take this medicine with any of the following medications: -disulfiram -metronidazole This medicine may also interact with the following medications: -antiviral medicines for hepatitis, HIV or AIDS -certain antibiotics like erythromycin and clarithromycin -certain  medicines for fungal infections like ketoconazole and itraconazole -certain medicines for seizures like carbamazepine, phenobarbital, phenytoin -gemfibrozil -nefazodone -rifampin -St. John's wort This list may not describe all possible interactions. Give your health care provider a list of all the medicines, herbs, non-prescription drugs, or dietary supplements you use. Also tell them if you smoke, drink alcohol, or use illegal drugs. Some items may interact with your medicine. What should I watch for while using this medicine? Your condition will be monitored carefully while you are receiving this medicine. You will need important blood work done while you are taking this medicine. This medicine can cause serious allergic reactions. To reduce your risk you will need to take other medicine(s) before treatment with this medicine. If you experience allergic reactions like skin rash, itching or hives, swelling of the face, lips, or tongue, tell your doctor or health care professional right away. In some cases, you may be given additional medicines to help with side effects. Follow all directions for their use. This drug may make you feel generally unwell. This is not uncommon, as chemotherapy can affect healthy cells as well as cancer cells. Report any side effects. Continue your course of treatment even though you feel ill unless your doctor tells you to stop. Call your doctor or health care professional for advice if you get a fever, chills or sore throat, or other symptoms of a cold or flu. Do not treat yourself. This drug decreases your body's ability to fight infections. Try to avoid being around people who are sick. This medicine may increase your risk to bruise or bleed. Call your doctor or health care professional if you notice any unusual bleeding. Be careful brushing   and flossing your teeth or using a toothpick because you may get an infection or bleed more easily. If you have any dental work  done, tell your dentist you are receiving this medicine. Avoid taking products that contain aspirin, acetaminophen, ibuprofen, naproxen, or ketoprofen unless instructed by your doctor. These medicines may hide a fever. Do not become pregnant while taking this medicine. Women should inform their doctor if they wish to become pregnant or think they might be pregnant. There is a potential for serious side effects to an unborn child. Talk to your health care professional or pharmacist for more information. Do not breast-feed an infant while taking this medicine. Men are advised not to father a child while receiving this medicine. This product may contain alcohol. Ask your pharmacist or healthcare provider if this medicine contains alcohol. Be sure to tell all healthcare providers you are taking this medicine. Certain medicines, like metronidazole and disulfiram, can cause an unpleasant reaction when taken with alcohol. The reaction includes flushing, headache, nausea, vomiting, sweating, and increased thirst. The reaction can last from 30 minutes to several hours. What side effects may I notice from receiving this medicine? Side effects that you should report to your doctor or health care professional as soon as possible: -allergic reactions like skin rash, itching or hives, swelling of the face, lips, or tongue -breathing problems -changes in vision -fast, irregular heartbeat -high or low blood pressure -mouth sores -pain, tingling, numbness in the hands or feet -signs of decreased platelets or bleeding - bruising, pinpoint red spots on the skin, black, tarry stools, blood in the urine -signs of decreased red blood cells - unusually weak or tired, feeling faint or lightheaded, falls -signs of infection - fever or chills, cough, sore throat, pain or difficulty passing urine -signs and symptoms of liver injury like dark yellow or brown urine; general ill feeling or flu-like symptoms; light-colored  stools; loss of appetite; nausea; right upper belly pain; unusually weak or tired; yellowing of the eyes or skin -swelling of the ankles, feet, hands -unusually slow heartbeat Side effects that usually do not require medical attention (report to your doctor or health care professional if they continue or are bothersome): -diarrhea -hair loss -loss of appetite -muscle or joint pain -nausea, vomiting -pain, redness, or irritation at site where injected -tiredness This list may not describe all possible side effects. Call your doctor for medical advice about side effects. You may report side effects to FDA at 1-800-FDA-1088. Where should I keep my medicine? This drug is given in a hospital or clinic and will not be stored at home. NOTE: This sheet is a summary. It may not cover all possible information. If you have questions about this medicine, talk to your doctor, pharmacist, or health care provider.  2019 Elsevier/Gold Standard (2016-09-01 13:14:55)  

## 2018-06-24 NOTE — Progress Notes (Signed)
OK to treat pt today with ANC of 1.3 per Dr. Burr Medico

## 2018-06-30 ENCOUNTER — Ambulatory Visit (HOSPITAL_COMMUNITY)
Admission: RE | Admit: 2018-06-30 | Discharge: 2018-06-30 | Disposition: A | Discharge status: Home / Self Care | Payer: Medicaid Other | Source: Ambulatory Visit | Attending: Hematology | Admitting: Hematology

## 2018-06-30 ENCOUNTER — Other Ambulatory Visit: Payer: Self-pay

## 2018-06-30 DIAGNOSIS — C7951 Secondary malignant neoplasm of bone: Secondary | ICD-10-CM | POA: Diagnosis not present

## 2018-06-30 DIAGNOSIS — R079 Chest pain, unspecified: Secondary | ICD-10-CM | POA: Insufficient documentation

## 2018-06-30 DIAGNOSIS — I358 Other nonrheumatic aortic valve disorders: Secondary | ICD-10-CM | POA: Diagnosis not present

## 2018-06-30 DIAGNOSIS — C50911 Malignant neoplasm of unspecified site of right female breast: Secondary | ICD-10-CM | POA: Diagnosis not present

## 2018-06-30 NOTE — Progress Notes (Signed)
  Echocardiogram 2D Echocardiogram has been performed.  Debra Charleston 06/30/2018, 12:29 PM

## 2018-07-01 NOTE — Progress Notes (Signed)
[name]   [phone]) [phone] Fax:(336) [phone]   Clinic Follow up Note   Patient Care Team: [name], [name], [name] as PCP - General (Physician Assistant) [name], MD as Consulting Physician (Orthopedic Surgery)  Date of Service:  07/08/2018  CHIEF COMPLAINT: F/u ofmetastatic breast cancer  SUMMARY OF ONCOLOGIC HISTORY: Oncology History Overview Note   Cancer Staging No matching staging information was found for the patient.     [name], right ([place])  11/1997 Cancer Diagnosis   Patient had 1.4 cm poorly differentiated right breast carcinoma diagnosed in Nov 1999 at age 41, 1/7 axillary nodes involved, ER/PR and HER 2 positive. She had mastectomy with the 7 axillary node evaluation, 4 cycles of adriamycin/ cytoxan followed by taxotere, then five years of tamoxifen thru June 2005 (no herceptin in 1999). She had bilateral oophorectomy in May 2005. She was briefly on aromatase inhibitor after tamoxifen, but discontinued this herself due to poor tolerance.   04/04/2015 Genetic Testing   Negative genetic testing on the breast/ovarian cancer pnael.  The Breast/Ovarian gene panel offered by [facility] includes sequencing and rearrangement analysis for the following 20 genes:  ATM, BARD1, BRCA1, BRCA2, BRIP1, CDH1, CHEK2, EPCAM, FANCC, MLH1, MSH2, MSH6, NBN, PALB2, PMS2, PTEN, RAD51C, RAD51D, TP53, and XRCC2.   06/26/2015 Pathology Results   Initial Path Report Bone, curettage, Left femur met, intramedullary subtroch tissue - METASTATIC ADENOCARCINOMA.  Estrogen Receptor: 95%, POSITIVE, STRONG STAINING INTENSITY Progesterone Receptor: 2%, POSITIVE, STRONG  HER2 (+), IHC 3+   06/26/2015 Surgery   Biopsy of metastatic tissue and stabilization with Affixus trochanteric nail, proximal and distal interlock of left femur by Dr. [name]    06/28/2015 Imaging   CT Chest w/ Contrast 1. Extensive right internal mammary chain  lymphadenopathy consistent with metastatic breast cancer. 2. Lytic metastases involving the posterior elements at T1 with probable nondisplaced pathologic fracture of the spinous process. 3. No other evidence of thoracic metastatic disease. 4. Trace bilateral pleural effusions with associated bibasilar atelectasis. 5. Indeterminate right thyroid nodule, not previously imaged. This could be evaluated with thyroid ultrasound as clinically warranted.   07/01/2015 Imaging   Bone Scan 1. Increased activity noted throughout the left femur. Although metastatic disease cannot be excluded, these changes are most likely secondary to prior surgery. 2. No other focal abnormalities identified to suggest metastatic disease.   07/12/2015 - 07/26/2015 Radiation Therapy   Left femur, 30 Gy in 10 fractions by Dr. Sondra Come    07/14/2015 Initial Diagnosis   [name], right ([place])   07/19/2015 Imaging   Initial PET Scan 1. Severe multifocal osseous metastatic disease, much greater than anticipated based on prior imaging. 2. Multifocal nodal metastases involving the right internal mammary, prevascular and subpectoral lymph nodes. No axillary pulmonary involvement identified. 3. No distant extra osseous metastases.   07/30/2015 - 11/29/2015 Chemotherapy   Docetaxel, Herceptin and Perjeta every 3 weeks X6 cycles, pt tolerated moderately well, restaging scan showed excellent response    09/18/2015 Imaging   CT Head w/wo Contrast 1. No acute intracranial abnormality or significant interval change. 2. Stable minimal periventricular white matter hypoattenuation on the right. This may reflect a remote ischemic injury. 3. No evidence for metastatic disease to the brain. 4. No focal soft tissue lesion to explain the patient's tenderness.   10/30/2015 Imaging   CT Abdomen Pelvis w/ Contrast 1. Few mildly prominent fluid-filled loops of small bowel scattered throughout the abdomen, with  intraluminal fluid density within the distal colon. Given the provided history, findings are suggestive of acute enteritis/diarrheal illness. 2. No other acute intra-abdominal or pelvic process identified. 3. Widespread osseous metastatic disease, better evaluated on most recent PET-CT from 07/19/2015. No pathologic fracture or other complication. 4. 11 mm hypodensity within the left kidney. This lesion measures intermediate density, and is indeterminate. While this lesion is similar in size relative to recent studies, this is increased in size relative to prior study from 2012. Further evaluation with dedicated renal mass protocol CT and/or MRI is recommended for complete characterization.   12/2015 - 05/14/2016 Chemotherapy   Maintenance Herceptin and pejeta every 3 weeks stopped on 05/14/16 due to disease progression   12/25/2015 Imaging   Restaging PET Scan 1. Markedly improved skeletal activity, with only several faint foci of residual accentuated metabolic activity, but resolution of the vast majority of the previously extensive osseous metastatic disease. 2. Resolution of the prior right internal mammary and right prevascular lymph nodes. 3. Residual subcutaneous edema and edema along fascia planes in the left upper thigh. This remains somewhat more than I would expect for placement of an IM nail 6 weeks ago. If there is leg swelling further down, consider Doppler venous ultrasound to rule out left lower extremity DVT. I do not perceive an obvious difference in density between the pelvic and common femoral veins on the noncontrast CT data. 4. Chronic right maxillary and right sphenoid sinusitis.   01/16/2016 - 10/20/2017 Anti-estrogen oral therapy   Letrozole 2.5 mg daily. She was switched to exemestane due to disease progression on 02/08/17. Stopped on 10/20/2017 when treatment changed to chemo    03/01/2016 Miscellaneous   Patient presented to ED following a fall; she reports she landed on her  back and is now experiencing back pain. The patient was evaluated and discharge home same day with pain control.   05/01/2016 Imaging   CT CAP IMPRESSION: 1. Stable exam.  No new or progressive disease identified. 2. No evidence for residual or recurrent adenopathy within the chest. 3. Stable bone metastasis.   05/01/2016 Imaging   BONE SCAN IMPRESSION: 1. Small focal uptake involving the anterior rib ends of the left second and right fourth ribs, new since the prior bone scan. The location of this uptake is more suggestive of a traumatic or inflammatory etiology as opposed to metastatic disease. No other evidence of metastatic disease. 2. There are other areas of stable uptake which are most likely degenerative/reactive, including right shoulder and cervical spine uptake and uptake along the right femur adjacent to the ORIF hardware.   05/20/2016 Imaging   NM PET Skull to Thigh IMPRESSION: 1. There multiple metastatic foci in the bony pelvis, including some new abnormal foci compare to the prior PET-CT, compatible with mildly progressive bony metastatic disease. Also, a left T11 vertebral hypermetabolic metastatic lesion is observed, new compared to the prior exam. 2. No active extraosseous malignancy is currently identified. 3. Right anterior fourth rib healing fracture, appears be   06/04/2016 - 01/08/2017 Chemotherapy   Second line chemotherapy Kadcyla every 3 weeks started on 06/04/16 and stopped on 01/08/17 due to mixed response.     10/05/2016 Imaging   CT CAP and Bone Scan:  Bones: left intramedullary rod and left femoral head neck screw in place. In the proximal diaphysis of the left femur there is cortical thickening and irregularity. No lytic or blastic bone lesions. No fracture or vertebral endplate destruction.   No definite evidence of recurrent  disease in the chest, abdomen, or pelvis.   10/28/2016 Imaging   CT A/P IMPRESSION: No acute process demonstrated in  the abdomen or pelvis. No evidence of bowel obstruction or inflammation.   11/04/2016 Imaging   DG foot complete left: IMPRESSION: No fracture or dislocation.  No soft tissue abnormality   11/23/2016 Imaging   CT RIGHT FEMUR IMPRESSION: Negative CT scan of the right thigh.  No visible metastatic disease.  CT LEFT FEMUR IMPRESSION: 1. Postsurgical changes related to prior cephalomedullary rod fixation of the left femur with linear lucency along the anterolateral proximal femoral cortex at level of the lesser trochanter, suspicious for nondisplaced fracture. 2. Slightly permeative appearance of the proximal left femoral cortex at the site of known prior osseous metastases is largely unchanged. 3. Unchanged patchy lucency and sclerosis along the anterior acetabulum, which appears to correspond with an area of faint uptake on the prior PET-CT, suspicious for osseous metastasis. These results will be called to the ordering clinician or representative by the Radiologist Assistant, and communication documented in the PACS or zVision Dashboard.     01/27/2017 Imaging   IMPRESSION: 1. Mixed response to osseous metastasis. Some hypermetabolic lesions are new and increasingly hypermetabolic, while another index lesion is decreasingly hypermetabolic. 2. No evidence of hypermetabolic soft tissue metastasis.    02/08/2017 - 10/19/2017 Antibody Plan   -Herceptin every 3 week since 02/08/17 -Oral Lanpantinib 1548m and decreased to 5027mdue to diarrhea and depression. Due to disease progression she swithced to Verzenio 15032mn 05/04/17. She did not toelrate well so she was switched to Ibrance 100m35m 05/31/17. Due to her neutropenia, Ibrance dose reduced to 75 mg daily, 3 weeks on, one-week off, stopped on 10/20/2017 due to disease progression.    04/30/2017 PET scan   IMPRESSION: 1. Primarily increase in hypermetabolism of osseous metastasis. An isolated right acetabular lesion is  decreased in hypermetabolism since the prior exam. 2. No evidence of hypermetabolic soft tissue metastasis. 3. Similar non FDG avid right-sided thyroid nodule, favoring a benign etiology. Recommend attention on follow-up.   08/30/2017 PET scan   08/30/2017 PET Scan  IMPRESSION: 1. Worsening osseous metastatic disease. 2. Focal hypermetabolism in the central spinal canal the L1 level, similar to the prior exam. Metastatic implant cannot be excluded. 3. Slight enlargement of a right thyroid nodule which now appears more cystic in character. Consider further evaluation with thyroid ultrasound. If patient is clinically hyperthyroid, consider nuclear medicine thyroid uptake and scan   08/30/2017 Progression   08/30/2017 PET Scan  IMPRESSION: 1. Worsening osseous metastatic disease. 2. Focal hypermetabolism in the central spinal canal the L1 level, similar to the prior exam. Metastatic implant cannot be excluded. 3. Slight enlargement of a right thyroid nodule which now appears more cystic in character. Consider further evaluation with thyroid ultrasound. If patient is clinically hyperthyroid, consider nuclear medicine thyroid uptake and scan   09/29/2017 - 10/11/2017 Radiation Therapy   Pt received 27 Gy in 9 fractions directed at the areas of significant uptake noted on recent bone scan the right pelvis and right proximal femur, with Dr, KinaSondra Come10/09/2017 - 04/07/2018 Chemotherapy   -Herceptin every 3 weeks starting 02/08/17, perjeta added on 10/20/2017  -weekly Taxol started on 10/21/2017. Will reduce Taxol to 2 weeks on, 1 week off starting on 02/04/17.  Stoped due to mild disease progression   10/27/2017 Imaging   10/27/2017 CT AP IMPRESSION: 1. No acute abnormality. No evidence of bowel obstruction or acute bowel  inflammation. Normal appendix. 2. Stable patchy sclerotic osseous metastases throughout the visualized skeleton. No new or progressive metastatic disease in the abdomen  or pelvis.   01/11/2018 Imaging   Whole Body Bone Scan 01/11/18  IMPRESSION: 1. Multifocal osseous metastases as described. 2. Right scapular metastasis. 3. Right superior thoracic spinous process. 4. Right sacral ala and right L5 metastases. 5. Right acetabular metastasis. 6. Right proximal femur metastasis. 7. Diffuse metastases throughout the left femur. 8. Anterior right fourth rib metastasis.   01/11/2018 Imaging   CT CAP W Contrast 01/11/18  IMPRESSION: 1. Similar-appearing osseous metastatic disease. 2. No evidence for progressed metastatic disease in the chest, abdomen or pelvis.   03/30/2018 Imaging   Bone Scan 03/30/18  IMPRESSION: Stable multifocal bony metastatic disease.   03/30/2018 Imaging    CT CAP W Contrast 03/30/18 IMPRESSION: 1. New mild contiguous wall thickening with associated mucosal hyperenhancement throughout the left colon and rectum, suggesting nonspecific infectious or inflammatory proctocolitis. Differential includes C diff colitis. 2. Stable faint patchy sclerosis in the iliac bones and lumbar vertebral bodies. No new or progressive osseous metastatic disease by CT. Please note that the osseous lesions are relatively occult by CT and would be better evaluated by PET-CT, as clinically warranted. 3. No evidence of extra osseous metastatic disease.  These results will be called to the ordering clinician or representative by the Radiologist Assistant, and communication documented in the PACS or zVision Dashboard.   04/15/2018 - 06/16/2018 Chemotherapy   Enhertu q3weeks starting 04/15/18. Stopped Enhertu after cycle 3 on 05/27/18 due to poor toleration.    04/21/2018 PET scan   PET 04/21/18  IMPRESSION: 1. Overall marked interval improvement in bony hypermetabolic disease. All of the previously identified hypermetabolic osseous metastases have decreased in hypermetabolism in the interval. There is a new tiny focus of hypermetabolism in the right aspect  of the L3 vertebral body that appears to correspond to a new 8 mm lucency, raising concern for new metastatic disease. Close attention on follow-up recommended. 2. Focal hypermetabolism in the central spinal canal at the level of L1 previously has resolved. 3. No hypermetabolic soft tissue disease on today's study. 4. Right thyroid nodule seen previously has decreased in the interval.    06/16/2018 Imaging   CT LUMBAR SPINE WO CONTRAST 06/16/18 IMPRESSION: 1. Transitional lumbosacral anatomy. Correlation with radiographs is recommended prior to any operative intervention. 2. Suspicion of a right L3 vertebral body metastasis on the April PET-CT is not evident by CT. Stable degenerative versus metastatic lesion at the right L5 superior endplate. Small but increased right iliac bone metastasis since March. Lumbar MRI would best characterize lumbar metastatic disease (without contrast if necessary). 3. Suspect L2-L3 left subarticular segment disc herniation. Query left L2 radiculitis. 4. Stable appearance of rightward L4-L5 disc degeneration which could affect the right foramen and right lateral recess.   06/17/2018 -  Chemotherapy   Restart Taxol 2 weeks on/1 week off with Herceptin and Perjeta q3weeks on 06/17/18   07/06/2018 PET scan   PET  IMPRESSION: 1. Near total resolution of hypermetabolic activity associated with skeletal metastasis. Very mild activity associated with the LEFT iliac bone which is similar to background blood pool activity. 2. No new or progressive disease.      CURRENT THERAPY:  Restart Taxol 2 weeks on/1 week off with Herceptin and Perjeta q3weeks on 06/17/18  INTERVAL HISTORY:  [name] is here for a follow up and ongoing treatment. She presents to the clinic with her  interpretor. She notes having mild back pain, with pain in her shoulders and her left lower abdomen. She takes pain medication but this does not always help. The pain is constant and  throbbing 5/10. She notes her eating, stool and BMs are normal. She notes walking around is fair. She denies fever or nausea. She denies any vaginal issues or bleeding.  She takes Oxycodone every 6 hours. She will overall take it 3-4 times a day.    REVIEW OF SYSTEMS:   Constitutional: Denies fevers, chills or abnormal weight loss Eyes: Denies blurriness of vision Ears, nose, mouth, throat, and face: Denies mucositis or sore throat Respiratory: Denies cough, dyspnea or wheezes Cardiovascular: Denies palpitation, chest discomfort or lower extremity swelling Gastrointestinal:  Denies nausea, heartburn or change in bowel habits (+) Left lower abdominal throbbing pain (5/10).  Skin: Denies abnormal skin rashes MSK: (+) controlled back pain (+) b/l shoulder pain  Lymphatics: Denies new lymphadenopathy or easy bruising Neurological:Denies numbness, tingling or new weaknesses Behavioral/Psych: Mood is stable, no new changes  All other systems were reviewed with the patient and are negative.  MEDICAL HISTORY:  Past Medical History:  Diagnosis Date  . Abnormal Pap smear   . Anxiety   . Breast cancer ([place])   . Cancer ([place]) 1999   breast-s/p mastectomy, chemo, rad  . CIN I (cervical intraepithelial neoplasia I) 2003   by colpo  . Congenital deafness   . Deaf   . Depression   . Port-A-Cath in place 2017   for chemotherapy  . Radiation 07/12/15-07/26/15   left femur 30 Gy  . S/P bilateral oophorectomy     SURGICAL HISTORY: Past Surgical History:  Procedure Laterality Date  . ABDOMINAL HYSTERECTOMY    . INTRAMEDULLARY (IM) NAIL INTERTROCHANTERIC Left 06/26/2015   Procedure: LEFT BIOMET LONG AFFIXS NAIL;  Surgeon: [name], MD;  Location: [facility];  Service: Orthopedics;  Laterality: Left;  . IR GENERIC HISTORICAL  08/06/2015   IR US GUIDE VASC ACCESS LEFT 08/06/2015 Aletta Edouard, MD [facility]  . IR GENERIC HISTORICAL  08/06/2015   IR FLUORO GUIDE CV LINE LEFT 08/06/2015 Aletta Edouard, MD [facility]  . IR GENERIC HISTORICAL  03/05/2016   IR CV LINE INJECTION 03/05/2016 Markus Daft, MD [facility]  . LEFT HEART CATH AND CORONARY ANGIOGRAPHY N/A 12/13/2017   Procedure: LEFT HEART CATH AND CORONARY ANGIOGRAPHY;  Surgeon: Leonie Man, MD;  Location: [facility];  Service: Cardiovascular;  Laterality: N/A;  . MASTECTOMY     right breast  . MASTECTOMY    . OVARIAN CYST REMOVAL    . RADIOLOGY WITH ANESTHESIA Left 06/25/2015   Procedure: MRI OF LEFT HIP WITH OR WITHOUT CONTRAST;  Surgeon: Medication Radiologist, MD;  Location: [facility];  Service: Radiology;  Laterality: Left;  DR. MCINTYRE/MRI  . TUBAL LIGATION      I have reviewed the social history and family history with the patient and they are unchanged from previous note.  ALLERGIES:  is allergic to chlorhexidine; promethazine hcl; and diphenhydramine hcl.  MEDICATIONS:  Current Outpatient Medications  Medication Sig Dispense Refill  . albuterol (PROVENTIL HFA;VENTOLIN HFA) 108 (90 Base) MCG/ACT inhaler Inhale 2 puffs into the lungs every 6 (six) hours as needed for wheezing or shortness of breath. 1 Inhaler 0  . ALPRAZolam (XANAX) 0.25 MG tablet Take 1 tablet (0.25 mg total) by mouth 2 (two) times daily as needed for anxiety. 30 tablet 0  . dexamethasone (DECADRON) 4 MG  tablet Take 1 tablet (4 mg total) by mouth daily. 20 tablet 0  . diphenoxylate-atropine (LOMOTIL) 2.5-0.025 MG tablet Take 1-2 tablets by mouth 4 (four) times daily as needed for diarrhea or loose stools. 30 tablet 1  . lidocaine-prilocaine (EMLA) cream Apply 1 application topically as needed. Apply to Porta-Cath 1-2 hours prior to access as directed. 30 g 2  . methocarbamol (ROBAXIN) 500 MG tablet Take 1 tablet (500 mg total) by mouth 2 (two) times daily. 20 tablet 0  . omeprazole (PRILOSEC) 20 MG capsule Take 1 capsule (20 mg total) by mouth daily. 30 capsule 0  . ondansetron (ZOFRAN) 8 MG tablet Take 1 tablet (8 mg total) by mouth  every 8 (eight) hours as needed for nausea or vomiting. 45 tablet 1  . oxyCODONE (ROXICODONE) 5 MG immediate release tablet Take 1 tablet (5 mg total) by mouth every 6 (six) hours as needed for severe pain. 90 tablet 0  . potassium chloride (K-DUR) 10 MEQ tablet Take 2 tablets (20 mEq total) by mouth 2 (two) times daily. 180 tablet 3  . prochlorperazine (COMPAZINE) 10 MG tablet Take 1 tablet (10 mg total) by mouth every 6 (six) hours as needed for nausea or vomiting. 60 tablet 2  . zolpidem (AMBIEN) 5 MG tablet Take 1.5 tablets (7.5 mg total) by mouth at bedtime as needed for sleep. 45 tablet 0   No current facility-administered medications for this visit.    Facility-Administered Medications Ordered in Other Visits  Medication Dose Route Frequency Provider Last Rate Last Dose  . heparin lock flush 100 unit/mL  500 Units Intracatheter Once PRN Truitt Merle, MD      . PACLitaxel (TAXOL) 96 mg in sodium chloride 0.9 % 250 mL chemo infusion (</= '80mg'$ /m2)  60 mg/m2 (Treatment Plan Recorded) Intravenous Once Truitt Merle, MD      . pertuzumab (PERJETA) 840 mg in sodium chloride 0.9 % 250 mL chemo infusion  840 mg Intravenous Once Truitt Merle, MD 278 mL/hr at 07/08/18 1151 840 mg at 07/08/18 1151  . sodium chloride 0.9 % 1,000 mL with potassium chloride 10 mEq infusion   Intravenous Continuous Gordy Levan, MD   Stopped at 09/10/15 1653  . sodium chloride 0.9 % injection 10 mL  10 mL Intravenous PRN Livesay, Lennis P, MD      . sodium chloride flush (NS) 0.9 % injection 10 mL  10 mL Intracatheter PRN Truitt Merle, MD        PHYSICAL EXAMINATION: ECOG PERFORMANCE STATUS: 2 - Symptomatic, <50% confined to bed  Vitals:   07/08/18 0827  BP: 117/61  Pulse: 70  Resp: 18  Temp: 98.5 F (36.9 C)  SpO2: 100%   Filed Weights   07/08/18 0827  Weight: 131 lb 12.8 oz (59.8 kg)    GENERAL:alert, no distress and comfortable SKIN: skin color, texture, turgor are normal, no rashes or significant lesions  EYES: normal, Conjunctiva are pink and non-injected, sclera clear NECK: supple, thyroid normal size, non-tender, without nodularity LYMPH:  no palpable lymphadenopathy in the cervical, axillary  LUNGS: clear to auscultation and percussion with normal breathing effort HEART: regular rate & rhythm and no murmurs and no lower extremity edema ABDOMEN:abdomen soft, non-tender and normal bowel sounds Musculoskeletal:no cyanosis of digits and no clubbing  NEURO: alert & oriented x 3 with fluent speech, no focal motor/sensory deficits  LABORATORY DATA:  I have reviewed the data as listed CBC Latest Ref Rng & Units 07/08/2018 06/24/2018 06/17/2018  WBC 4.0 -  10.5 K/uL 3.0(L) 2.6(L) 2.4(L)  Hemoglobin 12.0 - 15.0 g/dL 10.4(L) 10.1(L) 10.9(L)  Hematocrit 36.0 - 46.0 % 31.1(L) 30.0(L) 33.0(L)  Platelets 150 - 400 K/uL 175 206 151     CMP Latest Ref Rng & Units 07/08/2018 06/24/2018 06/17/2018  Glucose 70 - 99 mg/dL 88 91 82  BUN 6 - 20 mg/dL '11 11 6  '$ Creatinine 0.44 - 1.00 mg/dL 0.83 0.79 0.75  Sodium 135 - 145 mmol/L 141 138 142  Potassium 3.5 - 5.1 mmol/L 3.7 3.6 3.5  Chloride 98 - 111 mmol/L 108 105 106  CO2 22 - 32 mmol/L '23 24 26  '$ Calcium 8.9 - 10.3 mg/dL 8.7(L) 8.6(L) 8.8(L)  Total Protein 6.5 - 8.1 g/dL 6.2(L) 6.2(L) 5.9(L)  Total Bilirubin 0.3 - 1.2 mg/dL 1.2 1.2 1.1  Alkaline Phos 38 - 126 U/L 54 52 55  AST 15 - 41 U/L '16 15 17  '$ ALT 0 - 44 U/L '18 17 18      '$ RADIOGRAPHIC STUDIES: I have personally reviewed the radiological images as listed and agreed with the findings in the report. No results found.   ASSESSMENT & PLAN:  AMZIE SILLAS is a 53 y.o. 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 Vernia Buff recently [employer], Herceptin and  Perjetaq3weeks,tolerated moderately well, had overall excellent response based on PET in 04/2018  -She completed 3 cycles of Enhertu and stopped due to poor toleration with anorexia, weight loss, N&V, diarrhea -I restarted her on Taxol 2 weeks on/1 week off with Herceptin and Perjeta q3weeks on 06/17/18, she tolerated first cycle well.  -Meantime she also developed moderate low back/left pelvic pain. PET from 07/06/18 shows near total resolution of hypermetabolic activity associated with bone mets. No new or progressive disease. Overall excellent response. I do not see anything on her PET scan corresponding to her pain -Labs reviewed, CBC and CMP WNL except WBC 3, Hg 10.4, Ca 8.7, protein 6.2, albumin 3.4. Overall adequate to proceed with Taxol/Herceptin/Perjeta today.  -F/u in 3 weeks   2.LLQ abdominal pain  -Has been throbbing and constant lately.  -I discussed this cause can be from her bowels or from previous left hip surgery, but she notes normal BMs and there are no indications from her latest PET scan.  -I encourage her to use some laxative since she is on oxycodone    3. Body pain, secondary to bone mets -since her cancer diagnosis,she has had b/l hip, shoulder and chest painat different time points, likely related to her bone mets -Ipreviouslyprescribed herGabapentin, but she decided not to take due to potential side effect of suicidal thoughts. -For her recent left lower back pain Ipreviouslyincreased her Robaxin.  -She is currently taking Oxycodone 5-'10mg'$  3-4 times a day as neededto controlher pain. OTC pain meds have not helped.  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  -She notes her back pain is mostly controlled now. Continue pain meds. Will monitor for need of palliative radiation.   4.Congenital deafness  5.Mild anemia, secondary to chemotherapy and malignancy -She will start  with oral iron daily for 1-2 months and then stop. She will also start daily women's vitamin. -Hgat 10.4(07/08/18)   6.Hypokalemia -He has intermittent hyperkalemia, on oral supplementKCL. -N&Vsubsided and  diarrhea is improving. -Ipreviouslyinstructed her to reduce dairy products in diet. -Knormal at 3.7 (07/08/18).Continue Oral Potassium BID.   7.Goal of care discussion -she understand her goal of care is palliative to prolong her life -she is full code for now  8.Insomnia -She cannot take Benadryl due to allergy reaction. She has tried trazodone with no relief.  -Her insomnia is not currently controlled with '5mg'$  Ambien. Ipreviouslyincrease her to7.'5mg'$  at night. -Due to recent shoulder and hip pain she hashad toincrease Ambien to '10mg'$  to help her fall asleep.I discussed that is the highest dose and she should not use '10mg'$  every night. -I advised her to avoid taking Ambien, oxycodone and Xanax all at the same time as this is too much sedation. Oxycodone can be taken with Ambien or with Xanax. She understands.    9. Anorexia, weight loss  -With Enhertu she has had taste change which lead to her not eating adequately and a 5 pound weight loss in 1 week.  -I strongly encouraged her to increase protein, calorie and overall food consumption to gain weight along with supplements. I also encouraged her to drink at least 40 ounces of liquid a day. -We previously switched back to Taxol/Herceptin/Perjeta on 06/17/18. Her weight has improved.   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  -Her neuropathy is currently mild and stable in her feet and LE, will monitor on treatment.    PLAN: -PET scan reviewed today, overall very good response, near complete response.  -I refilled Xanax, oxycodone and Ambien today  -Lab reviewed, overall adequate to proceed with Taxol, Herceptin, Perjeta today  -she will return next week for Taxol  -Lab, flush, f/u and  taxol/Herceptin/Perjeta in 3 weeks    No problem-specific Assessment & Plan notes found for this encounter.   No orders of the defined types were placed in this encounter.  All questions were answered. The patient knows to call the clinic with any problems, questions or concerns. No barriers to learning was detected. I spent 25 minutes counseling the patient face to face. The total time spent in the appointment was 30 minutes and more than 50% was on counseling and review of test results     Truitt Merle, MD 07/08/2018   I, Joslyn Devon, am acting as scribe for Truitt Merle, MD.   I have reviewed the above documentation for accuracy and completeness, and I agree with the above.

## 2018-07-06 ENCOUNTER — Other Ambulatory Visit: Payer: Self-pay

## 2018-07-06 ENCOUNTER — Ambulatory Visit (HOSPITAL_COMMUNITY)
Admission: RE | Admit: 2018-07-06 | Discharge: 2018-07-06 | Disposition: A | Discharge status: Home / Self Care | Payer: Medicaid Other | Source: Ambulatory Visit | Attending: Hematology | Admitting: Hematology

## 2018-07-06 DIAGNOSIS — C50911 Malignant neoplasm of unspecified site of right female breast: Secondary | ICD-10-CM

## 2018-07-06 DIAGNOSIS — C50919 Malignant neoplasm of unspecified site of unspecified female breast: Secondary | ICD-10-CM | POA: Diagnosis not present

## 2018-07-06 DIAGNOSIS — C7951 Secondary malignant neoplasm of bone: Secondary | ICD-10-CM | POA: Diagnosis not present

## 2018-07-06 LAB — GLUCOSE, CAPILLARY: Glucose-Capillary: 75 mg/dL (ref 70–99)

## 2018-07-06 MED ORDER — FLUDEOXYGLUCOSE F - 18 (FDG) INJECTION
6.3900 | Freq: Once | INTRAVENOUS | Status: AC
  Administered 2018-07-06: 6.39 via INTRAVENOUS

## 2018-07-08 ENCOUNTER — Inpatient Hospital Stay: Payer: Medicaid Other

## 2018-07-08 ENCOUNTER — Other Ambulatory Visit: Payer: Medicaid Other

## 2018-07-08 ENCOUNTER — Telehealth: Payer: Self-pay | Admitting: Hematology

## 2018-07-08 ENCOUNTER — Other Ambulatory Visit: Payer: Self-pay

## 2018-07-08 ENCOUNTER — Inpatient Hospital Stay (HOSPITAL_BASED_OUTPATIENT_CLINIC_OR_DEPARTMENT_OTHER): Payer: Medicaid Other | Admitting: Hematology

## 2018-07-08 ENCOUNTER — Ambulatory Visit: Payer: Medicaid Other

## 2018-07-08 ENCOUNTER — Encounter: Payer: Self-pay | Admitting: Hematology

## 2018-07-08 ENCOUNTER — Ambulatory Visit: Payer: Medicaid Other | Admitting: Hematology

## 2018-07-08 VITALS — BP 117/61 | HR 70 | Temp 98.5°F | Resp 18 | Ht 65.0 in | Wt 131.8 lb

## 2018-07-08 VITALS — BP 98/60 | HR 70 | Resp 18

## 2018-07-08 DIAGNOSIS — Z923 Personal history of irradiation: Secondary | ICD-10-CM | POA: Diagnosis not present

## 2018-07-08 DIAGNOSIS — H905 Unspecified sensorineural hearing loss: Secondary | ICD-10-CM | POA: Diagnosis not present

## 2018-07-08 DIAGNOSIS — C7951 Secondary malignant neoplasm of bone: Secondary | ICD-10-CM

## 2018-07-08 DIAGNOSIS — R630 Anorexia: Secondary | ICD-10-CM

## 2018-07-08 DIAGNOSIS — D6481 Anemia due to antineoplastic chemotherapy: Secondary | ICD-10-CM | POA: Diagnosis not present

## 2018-07-08 DIAGNOSIS — Z170 Estrogen receptor positive status [ER+]: Secondary | ICD-10-CM | POA: Diagnosis not present

## 2018-07-08 DIAGNOSIS — C50911 Malignant neoplasm of unspecified site of right female breast: Secondary | ICD-10-CM

## 2018-07-08 DIAGNOSIS — E875 Hyperkalemia: Secondary | ICD-10-CM | POA: Diagnosis not present

## 2018-07-08 DIAGNOSIS — R634 Abnormal weight loss: Secondary | ICD-10-CM | POA: Diagnosis not present

## 2018-07-08 DIAGNOSIS — E876 Hypokalemia: Secondary | ICD-10-CM | POA: Diagnosis not present

## 2018-07-08 DIAGNOSIS — Z9221 Personal history of antineoplastic chemotherapy: Secondary | ICD-10-CM | POA: Diagnosis not present

## 2018-07-08 DIAGNOSIS — Z95828 Presence of other vascular implants and grafts: Secondary | ICD-10-CM

## 2018-07-08 DIAGNOSIS — G4700 Insomnia, unspecified: Secondary | ICD-10-CM

## 2018-07-08 DIAGNOSIS — Z79899 Other long term (current) drug therapy: Secondary | ICD-10-CM

## 2018-07-08 DIAGNOSIS — Z5112 Encounter for antineoplastic immunotherapy: Secondary | ICD-10-CM | POA: Diagnosis not present

## 2018-07-08 LAB — CMP (CANCER CENTER ONLY)
ALT: 18 U/L (ref 0–44)
AST: 16 U/L (ref 15–41)
Albumin: 3.4 g/dL — ABNORMAL LOW (ref 3.5–5.0)
Alkaline Phosphatase: 54 U/L (ref 38–126)
Anion gap: 10 (ref 5–15)
BUN: 11 mg/dL (ref 6–20)
CO2: 23 mmol/L (ref 22–32)
Calcium: 8.7 mg/dL — ABNORMAL LOW (ref 8.9–10.3)
Chloride: 108 mmol/L (ref 98–111)
Creatinine: 0.83 mg/dL (ref 0.44–1.00)
GFR, Est AFR Am: >60 mL/min (ref >60)
GFR, Estimated: >60 mL/min (ref >60)
Glucose, Bld: 88 mg/dL (ref 70–99)
Potassium: 3.7 mmol/L (ref 3.5–5.1)
Sodium: 141 mmol/L (ref 135–145)
Total Bilirubin: 1.2 mg/dL (ref 0.3–1.2)
Total Protein: 6.2 g/dL — ABNORMAL LOW (ref 6.5–8.1)

## 2018-07-08 LAB — CBC WITH DIFFERENTIAL (CANCER CENTER ONLY)
Abs Immature Granulocytes: 0.03 10*3/uL (ref 0.00–0.07)
Basophils Absolute: 0 10*3/uL (ref 0.0–0.1)
Basophils Relative: 0 %
Eosinophils Absolute: 0 10*3/uL (ref 0.0–0.5)
Eosinophils Relative: 0 %
HCT: 31.1 % — ABNORMAL LOW (ref 36.0–46.0)
Hemoglobin: 10.4 g/dL — ABNORMAL LOW (ref 12.0–15.0)
Immature Granulocytes: 1 %
Lymphocytes Relative: 30 %
Lymphs Abs: 0.9 10*3/uL (ref 0.7–4.0)
MCH: 30.3 pg (ref 26.0–34.0)
MCHC: 33.4 g/dL (ref 30.0–36.0)
MCV: 90.7 fL (ref 80.0–100.0)
Monocytes Absolute: 0.4 10*3/uL (ref 0.1–1.0)
Monocytes Relative: 12 %
Neutro Abs: 1.7 10*3/uL (ref 1.7–7.7)
Neutrophils Relative %: 57 %
Platelet Count: 175 10*3/uL (ref 150–400)
RBC: 3.43 MIL/uL — ABNORMAL LOW (ref 3.87–5.11)
RDW: 14.1 % (ref 11.5–15.5)
WBC Count: 3 10*3/uL — ABNORMAL LOW (ref 4.0–10.5)
nRBC: 0 % (ref 0.0–0.2)

## 2018-07-08 MED ORDER — DEXAMETHASONE SODIUM PHOSPHATE 10 MG/ML IJ SOLN
10.0000 mg | Freq: Once | INTRAMUSCULAR | Status: AC
  Administered 2018-07-08: 10 mg via INTRAVENOUS

## 2018-07-08 MED ORDER — FAMOTIDINE IN NACL 20-0.9 MG/50ML-% IV SOLN
20.0000 mg | Freq: Once | INTRAVENOUS | Status: AC
  Administered 2018-07-08: 20 mg via INTRAVENOUS

## 2018-07-08 MED ORDER — ZOLPIDEM TARTRATE 5 MG PO TABS: 7.5000 mg | ORAL_TABLET | Freq: Every evening | ORAL | 0 refills | Status: DC | PRN

## 2018-07-08 MED ORDER — SODIUM CHLORIDE 0.9% FLUSH
10.0000 mL | Freq: Once | INTRAVENOUS | Status: AC
  Administered 2018-07-08: 10 mL
  Filled 2018-07-08: qty 10

## 2018-07-08 MED ORDER — SODIUM CHLORIDE 0.9 % IV SOLN
60.0000 mg/m2 | Freq: Once | INTRAVENOUS | Status: AC
  Administered 2018-07-08: 96 mg via INTRAVENOUS
  Filled 2018-07-08: qty 16

## 2018-07-08 MED ORDER — TRASTUZUMAB CHEMO 150 MG IV SOLR
6.0000 mg/kg | Freq: Once | INTRAVENOUS | Status: AC
  Administered 2018-07-08: 357 mg via INTRAVENOUS
  Filled 2018-07-08: qty 17

## 2018-07-08 MED ORDER — OXYCODONE HCL 5 MG PO TABS: 5.0000 mg | ORAL_TABLET | Freq: Four times a day (QID) | ORAL | 0 refills | Status: DC | PRN

## 2018-07-08 MED ORDER — SODIUM CHLORIDE 0.9 % IV SOLN
840.0000 mg | Freq: Once | INTRAVENOUS | Status: AC
  Administered 2018-07-08: 840 mg via INTRAVENOUS
  Filled 2018-07-08: qty 28

## 2018-07-08 MED ORDER — FAMOTIDINE IN NACL 20-0.9 MG/50ML-% IV SOLN
INTRAVENOUS | Status: AC
  Filled 2018-07-08: qty 50

## 2018-07-08 MED ORDER — ALPRAZOLAM 0.25 MG PO TABS: 0.2500 mg | ORAL_TABLET | Freq: Two times a day (BID) | ORAL | 0 refills | Status: DC | PRN

## 2018-07-08 MED ORDER — ACETAMINOPHEN 325 MG PO TABS
ORAL_TABLET | ORAL | Status: AC
  Filled 2018-07-08: qty 2

## 2018-07-08 MED ORDER — ACETAMINOPHEN 325 MG PO TABS
650.0000 mg | ORAL_TABLET | Freq: Once | ORAL | Status: AC
  Administered 2018-07-08: 650 mg via ORAL

## 2018-07-08 MED ORDER — HEPARIN SOD (PORK) LOCK FLUSH 100 UNIT/ML IV SOLN
500.0000 [IU] | Freq: Once | INTRAVENOUS | Status: AC | PRN
  Administered 2018-07-08: 500 [IU]
  Filled 2018-07-08: qty 5

## 2018-07-08 MED ORDER — SODIUM CHLORIDE 0.9 % IV SOLN
Freq: Once | INTRAVENOUS | Status: AC
  Administered 2018-07-08: 10:00:00 via INTRAVENOUS
  Filled 2018-07-08: qty 250

## 2018-07-08 MED ORDER — SODIUM CHLORIDE 0.9% FLUSH
10.0000 mL | INTRAVENOUS | Status: DC | PRN
  Administered 2018-07-08: 10 mL
  Filled 2018-07-08: qty 10

## 2018-07-08 MED ORDER — DEXAMETHASONE SODIUM PHOSPHATE 10 MG/ML IJ SOLN
INTRAMUSCULAR | Status: AC
  Filled 2018-07-08: qty 1

## 2018-07-08 NOTE — Progress Notes (Signed)
Per Dr. Burr Medico, hold benadryl due to patient allergy, ok to treat with EF 51%.

## 2018-07-08 NOTE — Telephone Encounter (Signed)
Scheduled appt per 6/26 los. Printed calendar and took it to the patient while she was in treatment.

## 2018-07-08 NOTE — Patient Instructions (Signed)
[facility] Discharge Instructions for Patients Receiving Chemotherapy  Today you received the following chemotherapy agents: Herceptin, Perjeta, Taxol  To help prevent nausea and vomiting after your treatment, we encourage you to take your nausea medication as directed   If you develop nausea and vomiting that is not controlled by your nausea medication, call the clinic.   BELOW ARE SYMPTOMS THAT SHOULD BE REPORTED IMMEDIATELY:  *FEVER GREATER THAN 100.5 F  *CHILLS WITH OR WITHOUT FEVER  NAUSEA AND VOMITING THAT IS NOT CONTROLLED WITH YOUR NAUSEA MEDICATION  *UNUSUAL SHORTNESS OF BREATH  *UNUSUAL BRUISING OR BLEEDING  TENDERNESS IN MOUTH AND THROAT WITH OR WITHOUT PRESENCE OF ULCERS  *URINARY PROBLEMS  *BOWEL PROBLEMS  UNUSUAL RASH Items with * indicate a potential emergency and should be followed up as soon as possible.  Feel free to call the clinic should you have any questions or concerns. The clinic phone number is [phone].  Please show the [place] at check-in to the Emergency Department and triage nurse.

## 2018-07-14 ENCOUNTER — Inpatient Hospital Stay: Payer: Medicaid Other

## 2018-07-14 ENCOUNTER — Inpatient Hospital Stay: Payer: Medicaid Other | Attending: Hematology

## 2018-07-14 ENCOUNTER — Other Ambulatory Visit: Payer: Self-pay

## 2018-07-14 VITALS — BP 108/72 | HR 77 | Temp 98.9°F | Resp 18

## 2018-07-14 DIAGNOSIS — C50911 Malignant neoplasm of unspecified site of right female breast: Secondary | ICD-10-CM | POA: Diagnosis not present

## 2018-07-14 DIAGNOSIS — D6481 Anemia due to antineoplastic chemotherapy: Secondary | ICD-10-CM | POA: Diagnosis not present

## 2018-07-14 DIAGNOSIS — Z5112 Encounter for antineoplastic immunotherapy: Secondary | ICD-10-CM | POA: Insufficient documentation

## 2018-07-14 DIAGNOSIS — Z9221 Personal history of antineoplastic chemotherapy: Secondary | ICD-10-CM | POA: Insufficient documentation

## 2018-07-14 DIAGNOSIS — Z170 Estrogen receptor positive status [ER+]: Secondary | ICD-10-CM | POA: Insufficient documentation

## 2018-07-14 DIAGNOSIS — Z79811 Long term (current) use of aromatase inhibitors: Secondary | ICD-10-CM | POA: Insufficient documentation

## 2018-07-14 DIAGNOSIS — G629 Polyneuropathy, unspecified: Secondary | ICD-10-CM | POA: Diagnosis not present

## 2018-07-14 DIAGNOSIS — R634 Abnormal weight loss: Secondary | ICD-10-CM | POA: Diagnosis not present

## 2018-07-14 DIAGNOSIS — Z923 Personal history of irradiation: Secondary | ICD-10-CM | POA: Insufficient documentation

## 2018-07-14 DIAGNOSIS — G4700 Insomnia, unspecified: Secondary | ICD-10-CM | POA: Insufficient documentation

## 2018-07-14 DIAGNOSIS — E875 Hyperkalemia: Secondary | ICD-10-CM | POA: Diagnosis not present

## 2018-07-14 DIAGNOSIS — R630 Anorexia: Secondary | ICD-10-CM | POA: Insufficient documentation

## 2018-07-14 DIAGNOSIS — C7951 Secondary malignant neoplasm of bone: Secondary | ICD-10-CM | POA: Diagnosis not present

## 2018-07-14 DIAGNOSIS — E876 Hypokalemia: Secondary | ICD-10-CM | POA: Diagnosis not present

## 2018-07-14 DIAGNOSIS — H905 Unspecified sensorineural hearing loss: Secondary | ICD-10-CM | POA: Diagnosis not present

## 2018-07-14 DIAGNOSIS — Z79899 Other long term (current) drug therapy: Secondary | ICD-10-CM | POA: Insufficient documentation

## 2018-07-14 DIAGNOSIS — Z95828 Presence of other vascular implants and grafts: Secondary | ICD-10-CM

## 2018-07-14 LAB — CMP (CANCER CENTER ONLY)
ALT: 23 U/L (ref 0–44)
AST: 16 U/L (ref 15–41)
Albumin: 3.9 g/dL (ref 3.5–5.0)
Alkaline Phosphatase: 62 U/L (ref 38–126)
Anion gap: 12 (ref 5–15)
BUN: 12 mg/dL (ref 6–20)
CO2: 18 mmol/L — ABNORMAL LOW (ref 22–32)
Calcium: 9 mg/dL (ref 8.9–10.3)
Chloride: 110 mmol/L (ref 98–111)
Creatinine: 0.8 mg/dL (ref 0.44–1.00)
GFR, Est AFR Am: >60 mL/min (ref >60)
GFR, Estimated: >60 mL/min (ref >60)
Glucose, Bld: 85 mg/dL (ref 70–99)
Potassium: 3 mmol/L — CL (ref 3.5–5.1)
Sodium: 140 mmol/L (ref 135–145)
Total Bilirubin: 1.7 mg/dL — ABNORMAL HIGH (ref 0.3–1.2)
Total Protein: 6.9 g/dL (ref 6.5–8.1)

## 2018-07-14 LAB — CBC WITH DIFFERENTIAL (CANCER CENTER ONLY)
Abs Immature Granulocytes: 0.15 10*3/uL — ABNORMAL HIGH (ref 0.00–0.07)
Basophils Absolute: 0 10*3/uL (ref 0.0–0.1)
Basophils Relative: 1 %
Eosinophils Absolute: 0 10*3/uL (ref 0.0–0.5)
Eosinophils Relative: 1 %
HCT: 33.3 % — ABNORMAL LOW (ref 36.0–46.0)
Hemoglobin: 11.4 g/dL — ABNORMAL LOW (ref 12.0–15.0)
Immature Granulocytes: 3 %
Lymphocytes Relative: 17 %
Lymphs Abs: 1 10*3/uL (ref 0.7–4.0)
MCH: 30.6 pg (ref 26.0–34.0)
MCHC: 34.2 g/dL (ref 30.0–36.0)
MCV: 89.5 fL (ref 80.0–100.0)
Monocytes Absolute: 0.3 10*3/uL (ref 0.1–1.0)
Monocytes Relative: 5 %
Neutro Abs: 4 10*3/uL (ref 1.7–7.7)
Neutrophils Relative %: 73 %
Platelet Count: 216 10*3/uL (ref 150–400)
RBC: 3.72 MIL/uL — ABNORMAL LOW (ref 3.87–5.11)
RDW: 13.9 % (ref 11.5–15.5)
WBC Count: 5.5 10*3/uL (ref 4.0–10.5)
nRBC: 0 % (ref 0.0–0.2)

## 2018-07-14 MED ORDER — POTASSIUM CHLORIDE CRYS ER 20 MEQ PO TBCR
EXTENDED_RELEASE_TABLET | ORAL | Status: AC
  Filled 2018-07-14: qty 2

## 2018-07-14 MED ORDER — SODIUM CHLORIDE 0.9% FLUSH
10.0000 mL | Freq: Once | INTRAVENOUS | Status: AC
  Administered 2018-07-14: 10 mL
  Filled 2018-07-14: qty 10

## 2018-07-14 MED ORDER — POTASSIUM CHLORIDE CRYS ER 20 MEQ PO TBCR: 40.0000 meq | EXTENDED_RELEASE_TABLET | Freq: Once | ORAL | Status: DC

## 2018-07-14 MED ORDER — DEXAMETHASONE SODIUM PHOSPHATE 10 MG/ML IJ SOLN
10.0000 mg | Freq: Once | INTRAMUSCULAR | Status: AC
  Administered 2018-07-14: 10 mg via INTRAVENOUS

## 2018-07-14 MED ORDER — FAMOTIDINE IN NACL 20-0.9 MG/50ML-% IV SOLN
20.0000 mg | Freq: Once | INTRAVENOUS | Status: AC
  Administered 2018-07-14: 10:00:00 20 mg via INTRAVENOUS

## 2018-07-14 MED ORDER — DIPHENHYDRAMINE HCL 50 MG/ML IJ SOLN: 25.0000 mg | Freq: Once | INTRAMUSCULAR | Status: DC

## 2018-07-14 MED ORDER — SODIUM CHLORIDE 0.9 % IV SOLN
Freq: Once | INTRAVENOUS | Status: AC
  Administered 2018-07-14: 09:00:00 via INTRAVENOUS
  Filled 2018-07-14: qty 250

## 2018-07-14 MED ORDER — DEXAMETHASONE SODIUM PHOSPHATE 10 MG/ML IJ SOLN
INTRAMUSCULAR | Status: AC
  Filled 2018-07-14: qty 1

## 2018-07-14 MED ORDER — FAMOTIDINE IN NACL 20-0.9 MG/50ML-% IV SOLN
INTRAVENOUS | Status: AC
  Filled 2018-07-14: qty 50

## 2018-07-14 MED ORDER — HEPARIN SOD (PORK) LOCK FLUSH 100 UNIT/ML IV SOLN
500.0000 [IU] | Freq: Once | INTRAVENOUS | Status: AC | PRN
  Administered 2018-07-14: 12:00:00 500 [IU]
  Filled 2018-07-14: qty 5

## 2018-07-14 MED ORDER — SODIUM CHLORIDE 0.9% FLUSH
10.0000 mL | INTRAVENOUS | Status: DC | PRN
  Administered 2018-07-14: 10 mL
  Filled 2018-07-14: qty 10

## 2018-07-14 MED ORDER — SODIUM CHLORIDE 0.9 % IV SOLN
60.0000 mg/m2 | Freq: Once | INTRAVENOUS | Status: AC
  Administered 2018-07-14: 96 mg via INTRAVENOUS
  Filled 2018-07-14: qty 16

## 2018-07-14 NOTE — Patient Instructions (Signed)
Paclitaxel injection What is this medicine? PACLITAXEL (PAK li TAX el) is a chemotherapy drug. It targets fast dividing cells, like cancer cells, and causes these cells to die. This medicine is used to treat ovarian cancer, breast cancer, lung cancer, Kaposi's sarcoma, and other cancers. This medicine may be used for other purposes; ask your health care provider or pharmacist if you have questions. COMMON BRAND NAME(S): Onxol, Taxol What should I tell my health care provider before I take this medicine? They need to know if you have any of these conditions: -history of irregular heartbeat -liver disease -low blood counts, like low white cell, platelet, or red cell counts -lung or breathing disease, like asthma -tingling of the fingers or toes, or other nerve disorder -an unusual or allergic reaction to paclitaxel, alcohol, polyoxyethylated castor oil, other chemotherapy, other medicines, foods, dyes, or preservatives -pregnant or trying to get pregnant -breast-feeding How should I use this medicine? This drug is given as an infusion into a vein. It is administered in a hospital or clinic by a specially trained health care professional. Talk to your pediatrician regarding the use of this medicine in children. Special care may be needed. Overdosage: If you think you have taken too much of this medicine contact a poison control center or emergency room at once. NOTE: This medicine is only for you. Do not share this medicine with others. What if I miss a dose? It is important not to miss your dose. Call your doctor or health care professional if you are unable to keep an appointment. What may interact with this medicine? Do not take this medicine with any of the following medications: -disulfiram -metronidazole This medicine may also interact with the following medications: -antiviral medicines for hepatitis, HIV or AIDS -certain antibiotics like erythromycin and clarithromycin -certain  medicines for fungal infections like ketoconazole and itraconazole -certain medicines for seizures like carbamazepine, phenobarbital, phenytoin -gemfibrozil -nefazodone -rifampin -St. John's wort This list may not describe all possible interactions. Give your health care provider a list of all the medicines, herbs, non-prescription drugs, or dietary supplements you use. Also tell them if you smoke, drink alcohol, or use illegal drugs. Some items may interact with your medicine. What should I watch for while using this medicine? Your condition will be monitored carefully while you are receiving this medicine. You will need important blood work done while you are taking this medicine. This medicine can cause serious allergic reactions. To reduce your risk you will need to take other medicine(s) before treatment with this medicine. If you experience allergic reactions like skin rash, itching or hives, swelling of the face, lips, or tongue, tell your doctor or health care professional right away. In some cases, you may be given additional medicines to help with side effects. Follow all directions for their use. This drug may make you feel generally unwell. This is not uncommon, as chemotherapy can affect healthy cells as well as cancer cells. Report any side effects. Continue your course of treatment even though you feel ill unless your doctor tells you to stop. Call your doctor or health care professional for advice if you get a fever, chills or sore throat, or other symptoms of a cold or flu. Do not treat yourself. This drug decreases your body's ability to fight infections. Try to avoid being around people who are sick. This medicine may increase your risk to bruise or bleed. Call your doctor or health care professional if you notice any unusual bleeding. Be careful brushing   and flossing your teeth or using a toothpick because you may get an infection or bleed more easily. If you have any dental work  done, tell your dentist you are receiving this medicine. Avoid taking products that contain aspirin, acetaminophen, ibuprofen, naproxen, or ketoprofen unless instructed by your doctor. These medicines may hide a fever. Do not become pregnant while taking this medicine. Women should inform their doctor if they wish to become pregnant or think they might be pregnant. There is a potential for serious side effects to an unborn child. Talk to your health care professional or pharmacist for more information. Do not breast-feed an infant while taking this medicine. Men are advised not to father a child while receiving this medicine. This product may contain alcohol. Ask your pharmacist or healthcare provider if this medicine contains alcohol. Be sure to tell all healthcare providers you are taking this medicine. Certain medicines, like metronidazole and disulfiram, can cause an unpleasant reaction when taken with alcohol. The reaction includes flushing, headache, nausea, vomiting, sweating, and increased thirst. The reaction can last from 30 minutes to several hours. What side effects may I notice from receiving this medicine? Side effects that you should report to your doctor or health care professional as soon as possible: -allergic reactions like skin rash, itching or hives, swelling of the face, lips, or tongue -breathing problems -changes in vision -fast, irregular heartbeat -high or low blood pressure -mouth sores -pain, tingling, numbness in the hands or feet -signs of decreased platelets or bleeding - bruising, pinpoint red spots on the skin, black, tarry stools, blood in the urine -signs of decreased red blood cells - unusually weak or tired, feeling faint or lightheaded, falls -signs of infection - fever or chills, cough, sore throat, pain or difficulty passing urine -signs and symptoms of liver injury like dark yellow or brown urine; general ill feeling or flu-like symptoms; light-colored  stools; loss of appetite; nausea; right upper belly pain; unusually weak or tired; yellowing of the eyes or skin -swelling of the ankles, feet, hands -unusually slow heartbeat Side effects that usually do not require medical attention (report to your doctor or health care professional if they continue or are bothersome): -diarrhea -hair loss -loss of appetite -muscle or joint pain -nausea, vomiting -pain, redness, or irritation at site where injected -tiredness This list may not describe all possible side effects. Call your doctor for medical advice about side effects. You may report side effects to FDA at 1-800-FDA-1088. Where should I keep my medicine? This drug is given in a hospital or clinic and will not be stored at home. NOTE: This sheet is a summary. It may not cover all possible information. If you have questions about this medicine, talk to your doctor, pharmacist, or health care provider.  2019 Elsevier/Gold Standard (2016-09-01 13:14:55)  

## 2018-07-14 NOTE — Addendum Note (Signed)
Addended by: Truitt Merle on: 07/14/2018 09:25 AM   Modules accepted: Orders

## 2018-07-14 NOTE — Progress Notes (Signed)
Per Dr. Burr Medico patient OK to treat with today's labs  Dr. Burr Medico prescribed patient take 42mEq of potassium during today's visit.  Per Dr. Burr Medico, pt OK to take her own pills since she has them with her today and they are easier to swallow.

## 2018-07-27 NOTE — Progress Notes (Signed)
[place]   [phone] [phone]   Clinic Follow up Note   Patient Care Team: [name], [name], [name] as PCP - General (Physician Assistant) [name], MD as Consulting Physician (Orthopedic Surgery)  Date of Service:  07/29/2018  CHIEF COMPLAINT:  F/u ofmetastatic breast cancer  SUMMARY OF ONCOLOGIC HISTORY: Oncology History Overview Note   Cancer Staging No matching staging information was found for the patient.     [name], right ([place])  11/1997 Cancer Diagnosis   Patient had 1.4 cm poorly differentiated right breast carcinoma diagnosed in Nov 1999 at age 53, 1/7 axillary nodes involved, ER/PR and HER 2 positive. She had mastectomy with the 7 axillary node evaluation, 4 cycles of adriamycin/ cytoxan followed by taxotere, then five years of tamoxifen thru June 2005 (no herceptin in 1999). She had bilateral oophorectomy in May 2005. She was briefly on aromatase inhibitor after tamoxifen, but discontinued this herself due to poor tolerance.   04/04/2015 Genetic Testing   Negative genetic testing on the breast/ovarian cancer pnael.  The Breast/Ovarian gene panel offered by [facility] includes sequencing and rearrangement analysis for the following 20 genes:  ATM, BARD1, BRCA1, BRCA2, BRIP1, CDH1, CHEK2, EPCAM, FANCC, MLH1, MSH2, MSH6, NBN, PALB2, PMS2, PTEN, RAD51C, RAD51D, TP53, and XRCC2.   06/26/2015 Pathology Results   Initial Path Report Bone, curettage, Left femur met, intramedullary subtroch tissue - METASTATIC ADENOCARCINOMA.  Estrogen Receptor: 95%, POSITIVE, STRONG STAINING INTENSITY Progesterone Receptor: 2%, POSITIVE, STRONG  HER2 (+), IHC 3+   06/26/2015 Surgery   Biopsy of metastatic tissue and stabilization with Affixus trochanteric nail, proximal and distal interlock of left femur by Dr. [name]    06/28/2015 Imaging   CT Chest w/ Contrast 1. Extensive right internal mammary chain  lymphadenopathy consistent with metastatic breast cancer. 2. Lytic metastases involving the posterior elements at T1 with probable nondisplaced pathologic fracture of the spinous process. 3. No other evidence of thoracic metastatic disease. 4. Trace bilateral pleural effusions with associated bibasilar atelectasis. 5. Indeterminate right thyroid nodule, not previously imaged. This could be evaluated with thyroid ultrasound as clinically warranted.   07/01/2015 Imaging   Bone Scan 1. Increased activity noted throughout the left femur. Although metastatic disease cannot be excluded, these changes are most likely secondary to prior surgery. 2. No other focal abnormalities identified to suggest metastatic disease.   07/12/2015 - 07/26/2015 Radiation Therapy   Left femur, 30 Gy in 10 fractions by Dr. Sondra Come    07/14/2015 Initial Diagnosis   [name], right ([place])   07/19/2015 Imaging   Initial PET Scan 1. Severe multifocal osseous metastatic disease, much greater than anticipated based on prior imaging. 2. Multifocal nodal metastases involving the right internal mammary, prevascular and subpectoral lymph nodes. No axillary pulmonary involvement identified. 3. No distant extra osseous metastases.   07/30/2015 - 11/29/2015 Chemotherapy   Docetaxel, Herceptin and Perjeta every 3 weeks X6 cycles, pt tolerated moderately well, restaging scan showed excellent response    09/18/2015 Imaging   CT Head w/wo Contrast 1. No acute intracranial abnormality or significant interval change. 2. Stable minimal periventricular white matter hypoattenuation on the right. This may reflect a remote ischemic injury. 3. No evidence for metastatic disease to the brain. 4. No focal soft tissue lesion to explain the patient's tenderness.   10/30/2015 Imaging   CT Abdomen Pelvis w/ Contrast 1. Few mildly prominent fluid-filled loops of small bowel scattered throughout the abdomen, with  with  intraluminal fluid density within the distal colon. Given the provided history, findings are suggestive of acute enteritis/diarrheal illness. 2. No other acute intra-abdominal or pelvic process identified. 3. Widespread osseous metastatic disease, better evaluated on most recent PET-CT from 07/19/2015. No pathologic fracture or other complication. 4. 11 mm hypodensity within the left kidney. This lesion measures intermediate density, and is indeterminate. While this lesion is similar in size relative to recent studies, this is increased in size relative to prior study from 2012. Further evaluation with dedicated renal mass protocol CT and/or MRI is recommended for complete characterization.   12/2015 - 05/14/2016 Chemotherapy   Maintenance Herceptin and pejeta every 3 weeks stopped on 05/14/16 due to disease progression   12/25/2015 Imaging   Restaging PET Scan 1. Markedly improved skeletal activity, with only several faint foci of residual accentuated metabolic activity, but resolution of the vast majority of the previously extensive osseous metastatic disease. 2. Resolution of the prior right internal mammary and right prevascular lymph nodes. 3. Residual subcutaneous edema and edema along fascia planes in the left upper thigh. This remains somewhat more than I would expect for placement of an IM nail 6 weeks ago. If there is leg swelling further down, consider Doppler venous ultrasound to rule out left lower extremity DVT. I do not perceive an obvious difference in density between the pelvic and common femoral veins on the noncontrast CT data. 4. Chronic right maxillary and right sphenoid sinusitis.   01/16/2016 - 10/20/2017 Anti-estrogen oral therapy   Letrozole 2.5 mg daily. She was switched to exemestane due to disease progression on 02/08/17. Stopped on 10/20/2017 when treatment changed to chemo    03/01/2016 Miscellaneous   Patient presented to ED following a fall; she reports she landed on her  back and is now experiencing back pain. The patient was evaluated and discharge home same day with pain control.   05/01/2016 Imaging   CT CAP IMPRESSION: 1. Stable exam.  No new or progressive disease identified. 2. No evidence for residual or recurrent adenopathy within the chest. 3. Stable bone metastasis.   05/01/2016 Imaging   BONE SCAN IMPRESSION: 1. Small focal uptake involving the anterior rib ends of the left second and right fourth ribs, new since the prior bone scan. The location of this uptake is more suggestive of a traumatic or inflammatory etiology as opposed to metastatic disease. No other evidence of metastatic disease. 2. There are other areas of stable uptake which are most likely degenerative/reactive, including right shoulder and cervical spine uptake and uptake along the right femur adjacent to the ORIF hardware.   05/20/2016 Imaging   NM PET Skull to Thigh IMPRESSION: 1. There multiple metastatic foci in the bony pelvis, including some new abnormal foci compare to the prior PET-CT, compatible with mildly progressive bony metastatic disease. Also, a left T11 vertebral hypermetabolic metastatic lesion is observed, new compared to the prior exam. 2. No active extraosseous malignancy is currently identified. 3. Right anterior fourth rib healing fracture, appears be   06/04/2016 - 01/08/2017 Chemotherapy   Second line chemotherapy Kadcyla every 3 weeks started on 06/04/16 and stopped on 01/08/17 due to mixed response.     10/05/2016 Imaging   CT CAP and Bone Scan:  Bones: left intramedullary rod and left femoral head neck screw in place. In the proximal diaphysis of the left femur there is cortical thickening and irregularity. No lytic or blastic bone lesions. No fracture or vertebral endplate destruction.   No definite evidence  of recurrent disease in the chest, abdomen, or pelvis.   10/28/2016 Imaging   CT A/P IMPRESSION: No acute process demonstrated in  the abdomen or pelvis. No evidence of bowel obstruction or inflammation.   11/04/2016 Imaging   DG foot complete left: IMPRESSION: No fracture or dislocation.  No soft tissue abnormality   11/23/2016 Imaging   CT RIGHT FEMUR IMPRESSION: Negative CT scan of the right thigh.  No visible metastatic disease.  CT LEFT FEMUR IMPRESSION: 1. Postsurgical changes related to prior cephalomedullary rod fixation of the left femur with linear lucency along the anterolateral proximal femoral cortex at level of the lesser trochanter, suspicious for nondisplaced fracture. 2. Slightly permeative appearance of the proximal left femoral cortex at the site of known prior osseous metastases is largely unchanged. 3. Unchanged patchy lucency and sclerosis along the anterior acetabulum, which appears to correspond with an area of faint uptake on the prior PET-CT, suspicious for osseous metastasis. These results will be called to the ordering clinician or representative by the Radiologist Assistant, and communication documented in the PACS or zVision Dashboard.     01/27/2017 Imaging   IMPRESSION: 1. Mixed response to osseous metastasis. Some hypermetabolic lesions are new and increasingly hypermetabolic, while another index lesion is decreasingly hypermetabolic. 2. No evidence of hypermetabolic soft tissue metastasis.    02/08/2017 - 10/19/2017 Antibody Plan   -Herceptin every 3 week since 02/08/17 -Oral Lanpantinib 1588m and decreased to 5060mdue to diarrhea and depression. Due to disease progression she swithced to Verzenio 15022mn 05/04/17. She did not toelrate well so she was switched to Ibrance 100m58m 05/31/17. Due to her neutropenia, Ibrance dose reduced to 75 mg daily, 3 weeks on, one-week off, stopped on 10/20/2017 due to disease progression.    04/30/2017 PET scan   IMPRESSION: 1. Primarily increase in hypermetabolism of osseous metastasis. An isolated right acetabular lesion is  decreased in hypermetabolism since the prior exam. 2. No evidence of hypermetabolic soft tissue metastasis. 3. Similar non FDG avid right-sided thyroid nodule, favoring a benign etiology. Recommend attention on follow-up.   08/30/2017 PET scan   08/30/2017 PET Scan  IMPRESSION: 1. Worsening osseous metastatic disease. 2. Focal hypermetabolism in the central spinal canal the L1 level, similar to the prior exam. Metastatic implant cannot be excluded. 3. Slight enlargement of a right thyroid nodule which now appears more cystic in character. Consider further evaluation with thyroid ultrasound. If patient is clinically hyperthyroid, consider nuclear medicine thyroid uptake and scan   08/30/2017 Progression   08/30/2017 PET Scan  IMPRESSION: 1. Worsening osseous metastatic disease. 2. Focal hypermetabolism in the central spinal canal the L1 level, similar to the prior exam. Metastatic implant cannot be excluded. 3. Slight enlargement of a right thyroid nodule which now appears more cystic in character. Consider further evaluation with thyroid ultrasound. If patient is clinically hyperthyroid, consider nuclear medicine thyroid uptake and scan   09/29/2017 - 10/11/2017 Radiation Therapy   Pt received 27 Gy in 9 fractions directed at the areas of significant uptake noted on recent bone scan the right pelvis and right proximal femur, with Dr, KinaSondra Come10/09/2017 - 04/07/2018 Chemotherapy   -Herceptin every 3 weeks starting 02/08/17, perjeta added on 10/20/2017  -weekly Taxol started on 10/21/2017. Will reduce Taxol to 2 weeks on, 1 week off starting on 02/04/17.  Stoped due to mild disease progression   10/27/2017 Imaging   10/27/2017 CT AP IMPRESSION: 1. No acute abnormality. No evidence of bowel obstruction or  inflammation. Normal appendix. 2. Stable patchy sclerotic osseous metastases throughout the visualized skeleton. No new or progressive metastatic disease in the abdomen  or pelvis.   01/11/2018 Imaging   Whole Body Bone Scan 01/11/18  IMPRESSION: 1. Multifocal osseous metastases as described. 2. Right scapular metastasis. 3. Right superior thoracic spinous process. 4. Right sacral ala and right L5 metastases. 5. Right acetabular metastasis. 6. Right proximal femur metastasis. 7. Diffuse metastases throughout the left femur. 8. Anterior right fourth rib metastasis.   01/11/2018 Imaging   CT CAP W Contrast 01/11/18  IMPRESSION: 1. Similar-appearing osseous metastatic disease. 2. No evidence for progressed metastatic disease in the chest, abdomen or pelvis.   03/30/2018 Imaging   Bone Scan 03/30/18  IMPRESSION: Stable multifocal bony metastatic disease.   03/30/2018 Imaging    CT CAP W Contrast 03/30/18 IMPRESSION: 1. New mild contiguous wall thickening with associated mucosal hyperenhancement throughout the left colon and rectum, suggesting nonspecific infectious or inflammatory proctocolitis. Differential includes C diff colitis. 2. Stable faint patchy sclerosis in the iliac bones and lumbar vertebral bodies. No new or progressive osseous metastatic disease by CT. Please note that the osseous lesions are relatively occult by CT and would be better evaluated by PET-CT, as clinically warranted. 3. No evidence of extra osseous metastatic disease.  These results will be called to the ordering clinician or representative by the Radiologist Assistant, and communication documented in the PACS or zVision Dashboard.   04/15/2018 - 06/16/2018 Chemotherapy   Enhertu q3weeks starting 04/15/18. Stopped Enhertu after cycle 3 on 05/27/18 due to poor toleration.    04/21/2018 PET scan   PET 04/21/18  IMPRESSION: 1. Overall marked interval improvement in bony hypermetabolic disease. All of the previously identified hypermetabolic osseous metastases have decreased in hypermetabolism in the interval. There is a new tiny focus of hypermetabolism in the right aspect  of the L3 vertebral body that appears to correspond to a new 8 mm lucency, raising concern for new metastatic disease. Close attention on follow-up recommended. 2. Focal hypermetabolism in the central spinal canal at the level of L1 previously has resolved. 3. No hypermetabolic soft tissue disease on today's study. 4. Right thyroid nodule seen previously has decreased in the interval.    06/16/2018 Imaging   CT LUMBAR SPINE WO CONTRAST 06/16/18 IMPRESSION: 1. Transitional lumbosacral anatomy. Correlation with radiographs is recommended prior to any operative intervention. 2. Suspicion of a right L3 vertebral body metastasis on the April PET-CT is not evident by CT. Stable degenerative versus metastatic lesion at the right L5 superior endplate. Small but increased right iliac bone metastasis since March. Lumbar MRI would best characterize lumbar metastatic disease (without contrast if necessary). 3. Suspect L2-L3 left subarticular segment disc herniation. Query left L2 radiculitis. 4. Stable appearance of rightward L4-L5 disc degeneration which could affect the right foramen and right lateral recess.   06/17/2018 -  Chemotherapy   Restart Taxol 2 weeks on/1 week off with Herceptin and Perjeta q3weeks on 06/17/18   07/06/2018 PET scan   PET  IMPRESSION: 1. Near total resolution of hypermetabolic activity associated with skeletal metastasis. Very mild activity associated with the LEFT iliac bone which is similar to background blood pool activity. 2. No new or progressive disease.      CURRENT THERAPY:  Restart Taxol 2 weeks on/1 week off with Herceptin and Perjeta q3weeks on 06/17/18. Dose reduce Perjeta with cycle 5 due to diarrhea.   INTERVAL HISTORY:  [name] is here for a follow up  and ongoing treatment. She presents to the clinic with her interpretor. She is doing well. Her back pain has improved and she is more active with cleaning around the house. She only takes  oxycodone only for severe pain which is not every day. She is not taking Muscle relaxer at the moment. She would like dose reduction of Perjeta due to diarrhea. Last cycle she had diarrhea daily.    REVIEW OF SYSTEMS:   Constitutional: Denies fevers, chills or abnormal weight loss Eyes: Denies blurriness of vision Ears, nose, mouth, throat, and face: Denies mucositis or sore throat Respiratory: Denies cough, dyspnea or wheezes Cardiovascular: Denies palpitation, chest discomfort or lower extremity swelling Gastrointestinal:  Denies nausea, heartburn (+) diarrhea  MSK: (+) Improved back pain  Skin: Denies abnormal skin rashes Lymphatics: Denies new lymphadenopathy or easy bruising Neurological:Denies numbness, tingling or new weaknesses Behavioral/Psych: Mood is stable, no new changes  All other systems were reviewed with the patient and are negative.  MEDICAL HISTORY:  Past Medical History:  Diagnosis Date   Abnormal Pap smear    Anxiety    Breast cancer ([place])    Cancer ([place]) 1999   breast-s/p mastectomy, chemo, rad   CIN I (cervical intraepithelial neoplasia I) 2003   by colpo   Congenital deafness    Deaf    Depression    Port-A-Cath in place 2017   for chemotherapy   Radiation 07/12/15-07/26/15   left femur 30 Gy   S/P bilateral oophorectomy     SURGICAL HISTORY: Past Surgical History:  Procedure Laterality Date   ABDOMINAL HYSTERECTOMY     INTRAMEDULLARY (IM) NAIL INTERTROCHANTERIC Left 06/26/2015   Procedure: LEFT BIOMET LONG AFFIXS NAIL;  Surgeon: [name], MD;  Location: [facility];  Service: Orthopedics;  Laterality: Left;   IR GENERIC HISTORICAL  08/06/2015   IR US GUIDE VASC ACCESS LEFT 08/06/2015 Aletta Edouard, MD [facility]   IR GENERIC HISTORICAL  08/06/2015   IR FLUORO GUIDE CV LINE LEFT 08/06/2015 Aletta Edouard, MD [facility]   IR GENERIC HISTORICAL  03/05/2016   IR CV LINE INJECTION 03/05/2016 Markus Daft, MD [facility]   LEFT HEART  CATH AND CORONARY ANGIOGRAPHY N/A 12/13/2017   Procedure: LEFT HEART CATH AND CORONARY ANGIOGRAPHY;  Surgeon: Leonie Man, MD;  Location: [facility];  Service: Cardiovascular;  Laterality: N/A;   MASTECTOMY     right breast   MASTECTOMY     OVARIAN CYST REMOVAL     RADIOLOGY WITH ANESTHESIA Left 06/25/2015   Procedure: MRI OF LEFT HIP WITH OR WITHOUT CONTRAST;  Surgeon: Medication Radiologist, MD;  Location: [facility];  Service: Radiology;  Laterality: Left;  DR. MCINTYRE/MRI   TUBAL LIGATION      I have reviewed the social history and family history with the patient and they are unchanged from previous note.  ALLERGIES:  is allergic to chlorhexidine; promethazine hcl; and diphenhydramine hcl.  MEDICATIONS:  Current Outpatient Medications  Medication Sig Dispense Refill   albuterol (PROVENTIL HFA;VENTOLIN HFA) 108 (90 Base) MCG/ACT inhaler Inhale 2 puffs into the lungs every 6 (six) hours as needed for wheezing or shortness of breath. 1 Inhaler 0   ALPRAZolam (XANAX) 0.25 MG tablet Take 1 tablet (0.25 mg total) by mouth 2 (two) times daily as needed for anxiety. 30 tablet 0   dexamethasone (DECADRON) 4 MG tablet Take 1 tablet (4 mg total) by mouth daily. 20 tablet 0   diphenoxylate-atropine (LOMOTIL) 2.5-0.025 MG tablet Take 1-2 tablets  by mouth 4 (four) times daily as needed for diarrhea or loose stools. 30 tablet 1   lidocaine-prilocaine (EMLA) cream Apply 1 application topically as needed. Apply to Porta-Cath 1-2 hours prior to access as directed. 30 g 2   methocarbamol (ROBAXIN) 500 MG tablet Take 1 tablet (500 mg total) by mouth 2 (two) times daily. 20 tablet 0   omeprazole (PRILOSEC) 20 MG capsule Take 1 capsule (20 mg total) by mouth daily. 30 capsule 0   ondansetron (ZOFRAN) 8 MG tablet Take 1 tablet (8 mg total) by mouth every 8 (eight) hours as needed for nausea or vomiting. 45 tablet 1   oxyCODONE (ROXICODONE) 5 MG immediate release tablet Take 1 tablet (5 mg  total) by mouth every 6 (six) hours as needed for severe pain. 90 tablet 0   potassium chloride (K-DUR) 10 MEQ tablet Take 2 tablets (20 mEq total) by mouth 2 (two) times daily. 180 tablet 3   prochlorperazine (COMPAZINE) 10 MG tablet Take 1 tablet (10 mg total) by mouth every 6 (six) hours as needed for nausea or vomiting. 60 tablet 2   zolpidem (AMBIEN) 5 MG tablet Take 1.5 tablets (7.5 mg total) by mouth at bedtime as needed for sleep. 45 tablet 0   No current facility-administered medications for this visit.    Facility-Administered Medications Ordered in Other Visits  Medication Dose Route Frequency Provider Last Rate Last Dose   potassium chloride SA (K-DUR) CR tablet 40 mEq  40 mEq Oral Once Truitt Merle, MD       sodium chloride 0.9 % 1,000 mL with potassium chloride 10 mEq infusion   Intravenous Continuous Livesay, Tamala Julian, MD   Stopped at 09/10/15 1653   sodium chloride 0.9 % injection 10 mL  10 mL Intravenous PRN Livesay, Lennis P, MD        PHYSICAL EXAMINATION: ECOG PERFORMANCE STATUS: 1 - Symptomatic but completely ambulatory  Vitals:   07/29/18 0909  BP: (!) 116/57  Pulse: 76  Resp: 18  Temp: 99.1 F (37.3 C)  SpO2: 100%   Filed Weights   07/29/18 0909  Weight: 132 lb 11.2 oz (60.2 kg)    GENERAL:alert, no distress and comfortable SKIN: skin color, texture, turgor are normal, no rashes or significant lesions EYES: normal, Conjunctiva are pink and non-injected, sclera clear  NECK: supple, thyroid normal size, non-tender, without nodularity LYMPH:  no palpable lymphadenopathy in the cervical, axillary  LUNGS: clear to auscultation and percussion with normal breathing effort HEART: regular rate & rhythm and no murmurs and no lower extremity edema ABDOMEN:abdomen soft, non-tender and normal bowel sounds Musculoskeletal:no cyanosis of digits and no clubbing  NEURO: alert & oriented x 3 with fluent speech, no focal motor/sensory deficits  LABORATORY DATA:  I  have reviewed the data as listed CBC Latest Ref Rng & Units 07/29/2018 07/14/2018 07/08/2018  WBC 4.0 - 10.5 K/uL 3.2(L) 5.5 3.0(L)  Hemoglobin 12.0 - 15.0 g/dL 9.8(L) 11.4(L) 10.4(L)  Hematocrit 36.0 - 46.0 % 28.9(L) 33.3(L) 31.1(L)  Platelets 150 - 400 K/uL 170 216 175     CMP Latest Ref Rng & Units 07/29/2018 07/14/2018 07/08/2018  Glucose 70 - 99 mg/dL 82 85 88  BUN 6 - 20 mg/dL '6 12 11  '$ Creatinine 0.44 - 1.00 mg/dL 0.75 0.80 0.83  Sodium 135 - 145 mmol/L 142 140 141  Potassium 3.5 - 5.1 mmol/L 3.0(LL) 3.0(LL) 3.7  Chloride 98 - 111 mmol/L 106 110 108  CO2 22 - 32 mmol/L 27 18(L)  23  Calcium 8.9 - 10.3 mg/dL 8.4(L) 9.0 8.7(L)  Total Protein 6.5 - 8.1 g/dL 5.9(L) 6.9 6.2(L)  Total Bilirubin 0.3 - 1.2 mg/dL 1.7(H) 1.7(H) 1.2  Alkaline Phos 38 - 126 U/L 57 62 54  AST 15 - 41 U/L 13(L) 16 16  ALT 0 - 44 U/L '12 23 18      '$ RADIOGRAPHIC STUDIES: I have personally reviewed the radiological images as listed and agreed with the findings in the report. No results found.   ASSESSMENT & PLAN:  NIKOLINA SIMERSON is a 53 y.o. 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 Vernia Buff recently [employer], Herceptin and Perjetaq3weeks,tolerated moderately well, had overall excellent response based on PET in 04/2018 -She completed 3 cycles of Enhertu and stopped due to poor toleration with anorexia, weight loss, N&V, diarrhea -I restarted her on Taxol 2 weeks on/1 week off with Herceptin and Perjeta q3weeks on 06/17/18, she tolerated first cycle well then developed diarrhea secondary to Perjeta, will dose reduce. Will d/s benadryl given allergy.  -Meantime she also developed moderate low back/left pelvic pain. PET from 07/06/18 shows near total resolution of hypermetabolic  activity associated with bone mets. No new or progressive disease. Overall excellent response. -Labs reviewed, CBC and CMP WNL except WBC 3.2, Hg 9.8, K3.0, tbil 1.7. CA 27.29 still pending. Overall adequate to proceed with Taxol/Herceptin/Perjeta today with dose reduced Perjeta due to diarrhea  -she will return next week for taxol  -F/u in 3 weeks    2. Body pain, secondary to bone mets -since her cancer diagnosis,she has had b/l hip, shoulder and chest painat different time points, likely related to her bone mets -Ipreviouslyprescribed herGabapentin, butshedecided not to take due to potential side effect of suicidal thoughts. 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  -She notes her back pain is much improved and very well controlled now. Continue pain meds. -Will monitor for need of palliative radiation.   3.Congenital deafness  4.Mild anemia, secondary to chemotherapy and malignancy -She will start with oral iron daily for 1-2 months and then stop. She will also start daily women's vitamin. -Hgat 10.4(07/08/18)   5.Hypokalemia, Diarrhea  -He has intermittent hyperkalemia, on oral supplementKCL. -Ipreviouslyinstructed her to reduce dairy products in diet. -She developed diarrhea s/p cycle 3 Perjeta. Will dose reduce.  -Kat 3.0 (07/29/18).will increase oral Potassiumto TID.Will give iv KCL 39mq today   6.Goal of care discussion -she understand her goal of care is palliative to prolong her life -she is full code for now  7.Insomnia -She cannot take Benadryl due to allergy reaction. She has tried trazodone with no relief.  -Her insomnia is not currently controlled with '5mg'$  Ambien. Ipreviouslyincrease her to7.'5mg'$  at night. -Due to recent shoulder and hip pain she hashad toincrease Ambien to '10mg'$  to help her fall asleep.I discussed that is the highest dose and she  should not use '10mg'$  every night. -I advised her to avoid taking Ambien, oxycodone and Xanax all at the same time as this is too much sedation. Oxycodone can be taken with Ambien or with Xanax. She understands.    8. Anorexia, weight loss  -WithEnhertushe has had taste change which lead to her not eating adequately and a 5 pound weight loss  in 1 week.  -I strongly encouraged her to increase protein, calorie and overall food consumption to gain weight along with supplements. I also encouraged her to drink at least 40 ounces of liquid a day. -We previously switched back to Taxol/Herceptin/Perjeta on 06/17/18. Her weight has improved, stable.   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  -Her neuropathy is currently mild and stable in her feet and LE, will monitor on treatment.    PLAN: -Lab reviewed, overall adequate to proceed with Taxol, Herceptin, Perjeta today with 50% dose reduction of Perjeta due to diarrhea  -she will return next week for Taxol  -Lab, flush, f/u and taxol/Herceptin/Perjeta in 3 weeks    No problem-specific Assessment & Plan notes found for this encounter.   No orders of the defined types were placed in this encounter.  All questions were answered. The patient knows to call the clinic with any problems, questions or concerns. No barriers to learning was detected. I spent 20 minutes counseling the patient face to face. The total time spent in the appointment was 25 minutes and more than 50% was on counseling and review of test results     Truitt Merle, MD 07/29/2018   I, Joslyn Devon, am acting as scribe for Truitt Merle, MD.   I have reviewed the above documentation for accuracy and completeness, and I agree with the above.

## 2018-07-29 ENCOUNTER — Other Ambulatory Visit: Payer: Self-pay

## 2018-07-29 ENCOUNTER — Inpatient Hospital Stay: Payer: Medicaid Other

## 2018-07-29 ENCOUNTER — Inpatient Hospital Stay (HOSPITAL_BASED_OUTPATIENT_CLINIC_OR_DEPARTMENT_OTHER): Payer: Medicaid Other | Admitting: Hematology

## 2018-07-29 ENCOUNTER — Telehealth: Payer: Self-pay | Admitting: Hematology

## 2018-07-29 VITALS — BP 116/57 | HR 76 | Temp 99.1°F | Resp 18 | Ht 60.5 in | Wt 132.7 lb

## 2018-07-29 DIAGNOSIS — E875 Hyperkalemia: Secondary | ICD-10-CM | POA: Diagnosis not present

## 2018-07-29 DIAGNOSIS — H905 Unspecified sensorineural hearing loss: Secondary | ICD-10-CM | POA: Diagnosis not present

## 2018-07-29 DIAGNOSIS — E876 Hypokalemia: Secondary | ICD-10-CM

## 2018-07-29 DIAGNOSIS — C50911 Malignant neoplasm of unspecified site of right female breast: Secondary | ICD-10-CM | POA: Diagnosis not present

## 2018-07-29 DIAGNOSIS — C7951 Secondary malignant neoplasm of bone: Secondary | ICD-10-CM

## 2018-07-29 DIAGNOSIS — G4700 Insomnia, unspecified: Secondary | ICD-10-CM | POA: Diagnosis not present

## 2018-07-29 DIAGNOSIS — Z5112 Encounter for antineoplastic immunotherapy: Secondary | ICD-10-CM | POA: Diagnosis not present

## 2018-07-29 DIAGNOSIS — C50919 Malignant neoplasm of unspecified site of unspecified female breast: Secondary | ICD-10-CM

## 2018-07-29 DIAGNOSIS — Z95828 Presence of other vascular implants and grafts: Secondary | ICD-10-CM

## 2018-07-29 DIAGNOSIS — D6481 Anemia due to antineoplastic chemotherapy: Secondary | ICD-10-CM | POA: Diagnosis not present

## 2018-07-29 DIAGNOSIS — Z9221 Personal history of antineoplastic chemotherapy: Secondary | ICD-10-CM

## 2018-07-29 DIAGNOSIS — Z79811 Long term (current) use of aromatase inhibitors: Secondary | ICD-10-CM

## 2018-07-29 DIAGNOSIS — Z79899 Other long term (current) drug therapy: Secondary | ICD-10-CM

## 2018-07-29 DIAGNOSIS — R630 Anorexia: Secondary | ICD-10-CM | POA: Diagnosis not present

## 2018-07-29 DIAGNOSIS — G629 Polyneuropathy, unspecified: Secondary | ICD-10-CM | POA: Diagnosis not present

## 2018-07-29 DIAGNOSIS — Z170 Estrogen receptor positive status [ER+]: Secondary | ICD-10-CM

## 2018-07-29 DIAGNOSIS — R634 Abnormal weight loss: Secondary | ICD-10-CM | POA: Diagnosis not present

## 2018-07-29 DIAGNOSIS — Z923 Personal history of irradiation: Secondary | ICD-10-CM

## 2018-07-29 LAB — CBC WITH DIFFERENTIAL (CANCER CENTER ONLY)
Abs Immature Granulocytes: 0.02 10*3/uL (ref 0.00–0.07)
Basophils Absolute: 0 10*3/uL (ref 0.0–0.1)
Basophils Relative: 0 %
Eosinophils Absolute: 0 10*3/uL (ref 0.0–0.5)
Eosinophils Relative: 1 %
HCT: 28.9 % — ABNORMAL LOW (ref 36.0–46.0)
Hemoglobin: 9.8 g/dL — ABNORMAL LOW (ref 12.0–15.0)
Immature Granulocytes: 1 %
Lymphocytes Relative: 28 %
Lymphs Abs: 0.9 10*3/uL (ref 0.7–4.0)
MCH: 30 pg (ref 26.0–34.0)
MCHC: 33.9 g/dL (ref 30.0–36.0)
MCV: 88.4 fL (ref 80.0–100.0)
Monocytes Absolute: 0.4 10*3/uL (ref 0.1–1.0)
Monocytes Relative: 11 %
Neutro Abs: 1.9 10*3/uL (ref 1.7–7.7)
Neutrophils Relative %: 59 %
Platelet Count: 170 10*3/uL (ref 150–400)
RBC: 3.27 MIL/uL — ABNORMAL LOW (ref 3.87–5.11)
RDW: 13.5 % (ref 11.5–15.5)
WBC Count: 3.2 10*3/uL — ABNORMAL LOW (ref 4.0–10.5)
nRBC: 0 % (ref 0.0–0.2)

## 2018-07-29 LAB — CMP (CANCER CENTER ONLY)
ALT: 12 U/L (ref 0–44)
AST: 13 U/L — ABNORMAL LOW (ref 15–41)
Albumin: 3.3 g/dL — ABNORMAL LOW (ref 3.5–5.0)
Alkaline Phosphatase: 57 U/L (ref 38–126)
Anion gap: 9 (ref 5–15)
BUN: 6 mg/dL (ref 6–20)
CO2: 27 mmol/L (ref 22–32)
Calcium: 8.4 mg/dL — ABNORMAL LOW (ref 8.9–10.3)
Chloride: 106 mmol/L (ref 98–111)
Creatinine: 0.75 mg/dL (ref 0.44–1.00)
GFR, Est AFR Am: >60 mL/min (ref >60)
GFR, Estimated: >60 mL/min (ref >60)
Glucose, Bld: 82 mg/dL (ref 70–99)
Potassium: 3 mmol/L — CL (ref 3.5–5.1)
Sodium: 142 mmol/L (ref 135–145)
Total Bilirubin: 1.7 mg/dL — ABNORMAL HIGH (ref 0.3–1.2)
Total Protein: 5.9 g/dL — ABNORMAL LOW (ref 6.5–8.1)

## 2018-07-29 MED ORDER — TRASTUZUMAB CHEMO 150 MG IV SOLR
6.0000 mg/kg | Freq: Once | INTRAVENOUS | Status: AC
  Administered 2018-07-29: 11:00:00 357 mg via INTRAVENOUS
  Filled 2018-07-29: qty 17

## 2018-07-29 MED ORDER — SODIUM CHLORIDE 0.9 % IV SOLN
210.0000 mg | Freq: Once | INTRAVENOUS | Status: AC
  Administered 2018-07-29: 12:00:00 210 mg via INTRAVENOUS
  Filled 2018-07-29: qty 7

## 2018-07-29 MED ORDER — FAMOTIDINE IN NACL 20-0.9 MG/50ML-% IV SOLN
INTRAVENOUS | Status: AC
  Filled 2018-07-29: qty 50

## 2018-07-29 MED ORDER — SODIUM CHLORIDE 0.9 % IV SOLN
60.0000 mg/m2 | Freq: Once | INTRAVENOUS | Status: AC
  Administered 2018-07-29: 96 mg via INTRAVENOUS
  Filled 2018-07-29: qty 16

## 2018-07-29 MED ORDER — SODIUM CHLORIDE 0.9% FLUSH
10.0000 mL | INTRAVENOUS | Status: DC | PRN
  Administered 2018-07-29: 16:00:00 10 mL
  Filled 2018-07-29: qty 10

## 2018-07-29 MED ORDER — ZOLEDRONIC ACID 4 MG/100ML IV SOLN
4.0000 mg | Freq: Once | INTRAVENOUS | Status: AC
  Administered 2018-07-29: 11:00:00 4 mg via INTRAVENOUS
  Filled 2018-07-29: qty 100

## 2018-07-29 MED ORDER — POTASSIUM CHLORIDE IN NACL 20-0.9 MEQ/L-% IV SOLN
INTRAVENOUS | Status: AC
  Administered 2018-07-29: 11:00:00 via INTRAVENOUS
  Filled 2018-07-29: qty 1000

## 2018-07-29 MED ORDER — ACETAMINOPHEN 325 MG PO TABS
650.0000 mg | ORAL_TABLET | Freq: Once | ORAL | Status: AC
  Administered 2018-07-29: 650 mg via ORAL

## 2018-07-29 MED ORDER — SODIUM CHLORIDE 0.9 % IV SOLN
Freq: Once | INTRAVENOUS | Status: AC
  Administered 2018-07-29: 10:00:00 via INTRAVENOUS
  Filled 2018-07-29: qty 250

## 2018-07-29 MED ORDER — LORATADINE 10 MG PO TABS
ORAL_TABLET | ORAL | Status: AC
  Filled 2018-07-29: qty 1

## 2018-07-29 MED ORDER — POTASSIUM CHLORIDE 20 MEQ/15ML (10%) PO SOLN
20.0000 meq | Freq: Every day | ORAL | Status: DC
  Filled 2018-07-29: qty 15

## 2018-07-29 MED ORDER — HEPARIN SOD (PORK) LOCK FLUSH 100 UNIT/ML IV SOLN
500.0000 [IU] | Freq: Once | INTRAVENOUS | Status: AC | PRN
  Administered 2018-07-29: 500 [IU]
  Filled 2018-07-29: qty 5

## 2018-07-29 MED ORDER — DEXAMETHASONE SODIUM PHOSPHATE 10 MG/ML IJ SOLN
INTRAMUSCULAR | Status: AC
  Filled 2018-07-29: qty 1

## 2018-07-29 MED ORDER — ACETAMINOPHEN 325 MG PO TABS
ORAL_TABLET | ORAL | Status: AC
  Filled 2018-07-29: qty 2

## 2018-07-29 MED ORDER — LORATADINE 10 MG PO TABS
10.0000 mg | ORAL_TABLET | Freq: Once | ORAL | Status: AC
  Administered 2018-07-29: 10:00:00 10 mg via ORAL

## 2018-07-29 MED ORDER — FAMOTIDINE IN NACL 20-0.9 MG/50ML-% IV SOLN
20.0000 mg | Freq: Once | INTRAVENOUS | Status: AC
  Administered 2018-07-29: 10:00:00 20 mg via INTRAVENOUS

## 2018-07-29 MED ORDER — SODIUM CHLORIDE 0.9% FLUSH
10.0000 mL | Freq: Once | INTRAVENOUS | Status: AC
  Administered 2018-07-29: 10 mL
  Filled 2018-07-29: qty 10

## 2018-07-29 MED ORDER — DEXAMETHASONE SODIUM PHOSPHATE 10 MG/ML IJ SOLN
10.0000 mg | Freq: Once | INTRAMUSCULAR | Status: AC
  Administered 2018-07-29: 10:00:00 10 mg via INTRAVENOUS

## 2018-07-29 NOTE — Patient Instructions (Addendum)
[facility] Discharge Instructions for Patients Receiving Chemotherapy  PER DR FENG TAKE 2 TABLETS POTASSIUM IN AM AND 1 TABLET IN PM  Today you received the following chemotherapy agents: Herceptin, Perjeta, Taxol  To help prevent nausea and vomiting after your treatment, we encourage you to take your nausea medication as directed   If you develop nausea and vomiting that is not controlled by your nausea medication, call the clinic.   BELOW ARE SYMPTOMS THAT SHOULD BE REPORTED IMMEDIATELY:  *FEVER GREATER THAN 100.5 F  *CHILLS WITH OR WITHOUT FEVER  NAUSEA AND VOMITING THAT IS NOT CONTROLLED WITH YOUR NAUSEA MEDICATION  *UNUSUAL SHORTNESS OF BREATH  *UNUSUAL BRUISING OR BLEEDING  TENDERNESS IN MOUTH AND THROAT WITH OR WITHOUT PRESENCE OF ULCERS  *URINARY PROBLEMS  *BOWEL PROBLEMS  UNUSUAL RASH Items with * indicate a potential emergency and should be followed up as soon as possible.  Feel free to call the clinic should you have any questions or concerns. The clinic phone number is [phone].  Please show the [facility] at check-in to the Emergency Department and triage nurse.  Zoledronic Acid injection (Hypercalcemia, Oncology) What is this medicine? ZOLEDRONIC ACID (ZOE le dron ik AS id) lowers the amount of calcium loss from bone. It is used to treat too much calcium in your blood from cancer. It is also used to prevent complications of cancer that has spread to the bone. This medicine may be used for other purposes; ask your health care provider or pharmacist if you have questions. COMMON BRAND NAME(S): Zometa What should I tell my health care provider before I take this medicine? They need to know if you have any of these conditions:  aspirin-sensitive asthma  cancer, especially if you are receiving medicines used to treat cancer  dental disease or wear dentures  infection  kidney disease  receiving corticosteroids like dexamethasone  or prednisone  an unusual or allergic reaction to zoledronic acid, other medicines, foods, dyes, or preservatives  pregnant or trying to get pregnant  breast-feeding How should I use this medicine? This medicine is for infusion into a vein. It is given by a health care professional in a hospital or clinic setting. Talk to your pediatrician regarding the use of this medicine in children. Special care may be needed. Overdosage: If you think you have taken too much of this medicine contact a poison control center or emergency room at once. NOTE: This medicine is only for you. Do not share this medicine with others. What if I miss a dose? It is important not to miss your dose. Call your doctor or health care professional if you are unable to keep an appointment. What may interact with this medicine?  certain antibiotics given by injection  NSAIDs, medicines for pain and inflammation, like ibuprofen or naproxen  some diuretics like bumetanide, furosemide  teriparatide  thalidomide This list may not describe all possible interactions. Give your health care provider a list of all the medicines, herbs, non-prescription drugs, or dietary supplements you use. Also tell them if you smoke, drink alcohol, or use illegal drugs. Some items may interact with your medicine. What should I watch for while using this medicine? Visit your doctor or health care professional for regular checkups. It may be some time before you see the benefit from this medicine. Do not stop taking your medicine unless your doctor tells you to. Your doctor may order blood tests or other tests to see how you are doing.  Women should inform their doctor if they wish to become pregnant or think they might be pregnant. There is a potential for serious side effects to an unborn child. Talk to your health care professional or pharmacist for more information. You should make sure that you get enough calcium and vitamin D while you are  taking this medicine. Discuss the foods you eat and the vitamins you take with your health care professional. Some people who take this medicine have severe bone, joint, and/or muscle pain. This medicine may also increase your risk for jaw problems or a broken thigh bone. Tell your doctor right away if you have severe pain in your jaw, bones, joints, or muscles. Tell your doctor if you have any pain that does not go away or that gets worse. Tell your dentist and dental surgeon that you are taking this medicine. You should not have major dental surgery while on this medicine. See your dentist to have a dental exam and fix any dental problems before starting this medicine. Take good care of your teeth while on this medicine. Make sure you see your dentist for regular follow-up appointments. What side effects may I notice from receiving this medicine? Side effects that you should report to your doctor or health care professional as soon as possible:  allergic reactions like skin rash, itching or hives, swelling of the face, lips, or tongue  anxiety, confusion, or depression  breathing problems  changes in vision  eye pain  feeling faint or lightheaded, falls  jaw pain, especially after dental work  mouth sores  muscle cramps, stiffness, or weakness  redness, blistering, peeling or loosening of the skin, including inside the mouth  trouble passing urine or change in the amount of urine Side effects that usually do not require medical attention (report to your doctor or health care professional if they continue or are bothersome):  bone, joint, or muscle pain  constipation  diarrhea  fever  hair loss  irritation at site where injected  loss of appetite  nausea, vomiting  stomach upset  trouble sleeping  trouble swallowing  weak or tired This list may not describe all possible side effects. Call your doctor for medical advice about side effects. You may report side  effects to FDA at 1-800-FDA-1088. Where should I keep my medicine? This drug is given in a hospital or clinic and will not be stored at home. NOTE: This sheet is a summary. It may not cover all possible information. If you have questions about this medicine, talk to your doctor, pharmacist, or health care provider.  2020 Elsevier/Gold Standard (2013-05-27 14:19:39)   Hypokalemia Hypokalemia means that the amount of potassium in the blood is lower than normal. Potassium is a chemical (electrolyte) that helps regulate the amount of fluid in the body. It also stimulates muscle tightening (contraction) and helps nerves work properly. Normally, most of the body's potassium is inside cells, and only a very small amount is in the blood. Because the amount in the blood is so small, minor changes to potassium levels in the blood can be life-threatening. What are the causes? This condition may be caused by:  Antibiotic medicine.  Diarrhea or vomiting. Taking too much of a medicine that helps you have a bowel movement (laxative) can cause diarrhea and lead to hypokalemia.  Chronic kidney disease (CKD).  Medicines that help the body get rid of excess fluid (diuretics).  Eating disorders, such as bulimia.  Low magnesium levels in the body.  Sweating a lot. What are the signs or symptoms? Symptoms of this condition include:  Weakness.  Constipation.  Fatigue.  Muscle cramps.  Mental confusion.  Skipped heartbeats or irregular heartbeat (palpitations).  Tingling or numbness. How is this diagnosed? This condition is diagnosed with a blood test. How is this treated? This condition may be treated by:  Taking potassium supplements by mouth.  Adjusting the medicines that you take.  Eating more foods that contain a lot of potassium. If your potassium level is very low, you may need to get potassium through an IV and be monitored in the hospital. Follow these instructions at  home:   Take over-the-counter and prescription medicines only as told by your health care provider. This includes vitamins and supplements.  Eat a healthy diet. A healthy diet includes fresh fruits and vegetables, whole grains, healthy fats, and lean proteins.  If instructed, eat more foods that contain a lot of potassium. This includes: ? Nuts, such as peanuts and pistachios. ? Seeds, such as sunflower seeds and pumpkin seeds. ? Peas, lentils, and lima beans. ? Whole grain and bran cereals and breads. ? Fresh fruits and vegetables, such as apricots, avocado, bananas, cantaloupe, kiwi, oranges, tomatoes, asparagus, and potatoes. ? Orange juice. ? Tomato juice. ? Red meats. ? Yogurt.  Keep all follow-up visits as told by your health care provider. This is important. Contact a health care provider if you:  Have weakness that gets worse.  Feel your heart pounding or racing.  Vomit.  Have diarrhea.  Have diabetes (diabetes mellitus) and you have trouble keeping your blood sugar (glucose) in your target range. Get help right away if you:  Have chest pain.  Have shortness of breath.  Have vomiting or diarrhea that lasts for more than 2 days.  Faint. Summary  Hypokalemia means that the amount of potassium in the blood is lower than normal.  This condition is diagnosed with a blood test.  Hypokalemia may be treated by taking potassium supplements, adjusting the medicines that you take, or eating more foods that are high in potassium.  If your potassium level is very low, you may need to get potassium through an IV and be monitored in the hospital. This information is not intended to replace advice given to you by your health care provider. Make sure you discuss any questions you have with your health care provider. Document Released: 12/29/2004 Document Revised: 08/11/2017 Document Reviewed: 08/11/2017 Elsevier Patient Education  2020 [employer].

## 2018-07-29 NOTE — Progress Notes (Signed)
MD called to notify pharmacy that perjeta dose will be decreased by 50% d/t diarrhea w/ last cycle. Loratidine added as a premed in place of benadryl d/t allergy/intolerance to 1st gen antihistamine.   Demetrius Charity, PharmD, [facility] Pharmacist Pharmacy Phone: [phone] 07/29/2018

## 2018-07-29 NOTE — Telephone Encounter (Signed)
Scheduled appt per 7/17 los.  Printed calendar and avs and took the calendar to the patient in the treatment area.

## 2018-07-29 NOTE — Progress Notes (Signed)
Per Dr. Burr Medico okay to treat with Bilirubin 1.7

## 2018-07-30 LAB — CANCER ANTIGEN 27.29: CA 27.29: 35.6 U/mL (ref 0.0–38.6)

## 2018-07-31 ENCOUNTER — Encounter: Payer: Self-pay | Admitting: Hematology

## 2018-08-05 ENCOUNTER — Inpatient Hospital Stay: Payer: Medicaid Other

## 2018-08-05 ENCOUNTER — Other Ambulatory Visit: Payer: Self-pay

## 2018-08-05 ENCOUNTER — Other Ambulatory Visit: Payer: Self-pay | Admitting: Hematology

## 2018-08-05 VITALS — BP 126/86 | HR 77 | Temp 98.5°F | Resp 16 | Ht 60.5 in | Wt 131.0 lb

## 2018-08-05 DIAGNOSIS — C50911 Malignant neoplasm of unspecified site of right female breast: Secondary | ICD-10-CM

## 2018-08-05 DIAGNOSIS — C7951 Secondary malignant neoplasm of bone: Secondary | ICD-10-CM

## 2018-08-05 DIAGNOSIS — Z95828 Presence of other vascular implants and grafts: Secondary | ICD-10-CM

## 2018-08-05 DIAGNOSIS — Z5112 Encounter for antineoplastic immunotherapy: Secondary | ICD-10-CM | POA: Diagnosis not present

## 2018-08-05 LAB — CMP (CANCER CENTER ONLY)
ALT: 15 U/L (ref 0–44)
AST: 13 U/L — ABNORMAL LOW (ref 15–41)
Albumin: 3.5 g/dL (ref 3.5–5.0)
Alkaline Phosphatase: 57 U/L (ref 38–126)
Anion gap: 11 (ref 5–15)
BUN: 9 mg/dL (ref 6–20)
CO2: 23 mmol/L (ref 22–32)
Calcium: 8.9 mg/dL (ref 8.9–10.3)
Chloride: 106 mmol/L (ref 98–111)
Creatinine: 0.85 mg/dL (ref 0.44–1.00)
GFR, Est AFR Am: >60 mL/min (ref >60)
GFR, Estimated: >60 mL/min (ref >60)
Glucose, Bld: 90 mg/dL (ref 70–99)
Potassium: 3.2 mmol/L — ABNORMAL LOW (ref 3.5–5.1)
Sodium: 140 mmol/L (ref 135–145)
Total Bilirubin: 1.4 mg/dL — ABNORMAL HIGH (ref 0.3–1.2)
Total Protein: 6.4 g/dL — ABNORMAL LOW (ref 6.5–8.1)

## 2018-08-05 LAB — CBC WITH DIFFERENTIAL (CANCER CENTER ONLY)
Abs Immature Granulocytes: 0.16 10*3/uL — ABNORMAL HIGH (ref 0.00–0.07)
Basophils Absolute: 0 10*3/uL (ref 0.0–0.1)
Basophils Relative: 1 %
Eosinophils Absolute: 0.1 10*3/uL (ref 0.0–0.5)
Eosinophils Relative: 2 %
HCT: 31.7 % — ABNORMAL LOW (ref 36.0–46.0)
Hemoglobin: 10.8 g/dL — ABNORMAL LOW (ref 12.0–15.0)
Immature Granulocytes: 4 %
Lymphocytes Relative: 25 %
Lymphs Abs: 1 10*3/uL (ref 0.7–4.0)
MCH: 30.5 pg (ref 26.0–34.0)
MCHC: 34.1 g/dL (ref 30.0–36.0)
MCV: 89.5 fL (ref 80.0–100.0)
Monocytes Absolute: 0.3 10*3/uL (ref 0.1–1.0)
Monocytes Relative: 7 %
Neutro Abs: 2.4 10*3/uL (ref 1.7–7.7)
Neutrophils Relative %: 61 %
Platelet Count: 207 10*3/uL (ref 150–400)
RBC: 3.54 MIL/uL — ABNORMAL LOW (ref 3.87–5.11)
RDW: 13.3 % (ref 11.5–15.5)
WBC Count: 3.9 10*3/uL — ABNORMAL LOW (ref 4.0–10.5)
nRBC: 0 % (ref 0.0–0.2)

## 2018-08-05 MED ORDER — DEXAMETHASONE SODIUM PHOSPHATE 10 MG/ML IJ SOLN
INTRAMUSCULAR | Status: AC
  Filled 2018-08-05: qty 1

## 2018-08-05 MED ORDER — SODIUM CHLORIDE 0.9 % IV SOLN
60.0000 mg/m2 | Freq: Once | INTRAVENOUS | Status: AC
  Administered 2018-08-05: 12:00:00 96 mg via INTRAVENOUS
  Filled 2018-08-05: qty 16

## 2018-08-05 MED ORDER — HEPARIN SOD (PORK) LOCK FLUSH 100 UNIT/ML IV SOLN
500.0000 [IU] | Freq: Once | INTRAVENOUS | Status: AC | PRN
  Administered 2018-08-05: 13:00:00 500 [IU]
  Filled 2018-08-05: qty 5

## 2018-08-05 MED ORDER — DEXAMETHASONE SODIUM PHOSPHATE 10 MG/ML IJ SOLN
10.0000 mg | Freq: Once | INTRAMUSCULAR | Status: AC
  Administered 2018-08-05: 10 mg via INTRAVENOUS

## 2018-08-05 MED ORDER — FAMOTIDINE IN NACL 20-0.9 MG/50ML-% IV SOLN
20.0000 mg | Freq: Once | INTRAVENOUS | Status: AC
  Administered 2018-08-05: 20 mg via INTRAVENOUS

## 2018-08-05 MED ORDER — SODIUM CHLORIDE 0.9% FLUSH
10.0000 mL | INTRAVENOUS | Status: DC | PRN
  Administered 2018-08-05: 10 mL
  Filled 2018-08-05: qty 10

## 2018-08-05 MED ORDER — FAMOTIDINE IN NACL 20-0.9 MG/50ML-% IV SOLN
INTRAVENOUS | Status: AC
  Filled 2018-08-05: qty 50

## 2018-08-05 MED ORDER — SODIUM CHLORIDE 0.9% FLUSH
10.0000 mL | Freq: Once | INTRAVENOUS | Status: AC
  Administered 2018-08-05: 10 mL
  Filled 2018-08-05: qty 10

## 2018-08-05 MED ORDER — SODIUM CHLORIDE 0.9 % IV SOLN
Freq: Once | INTRAVENOUS | Status: AC
  Administered 2018-08-05: 11:00:00 via INTRAVENOUS
  Filled 2018-08-05: qty 250

## 2018-08-05 NOTE — Patient Instructions (Signed)
[facility] Discharge Instructions for Patients Receiving Chemotherapy  Today you received the following chemotherapy agents: Paclitaxel (Taxol)  To help prevent nausea and vomiting after your treatment, we encourage you to take your nausea medication as directed.    If you develop nausea and vomiting that is not controlled by your nausea medication, call the clinic.   BELOW ARE SYMPTOMS THAT SHOULD BE REPORTED IMMEDIATELY:  *FEVER GREATER THAN 100.5 F  *CHILLS WITH OR WITHOUT FEVER  NAUSEA AND VOMITING THAT IS NOT CONTROLLED WITH YOUR NAUSEA MEDICATION  *UNUSUAL SHORTNESS OF BREATH  *UNUSUAL BRUISING OR BLEEDING  TENDERNESS IN MOUTH AND THROAT WITH OR WITHOUT PRESENCE OF ULCERS  *URINARY PROBLEMS  *BOWEL PROBLEMS  UNUSUAL RASH Items with * indicate a potential emergency and should be followed up as soon as possible.  Feel free to call the clinic should you have any questions or concerns. The clinic phone number is [phone].  Please show the CHEMO ALERT CARD at check-in to the Emergency Department and triage nurse.  Coronavirus (COVID-19) Are you at risk?  Are you at risk for the Coronavirus (COVID-19)?  To be considered HIGH RISK for Coronavirus (COVID-19), you have to meet the following criteria:  . Traveled to [place], [place], [place], [place] or [place]; or in the [place] to [place], [place], [place], or [place]; and have fever, cough, and shortness of breath within the last 2 weeks of travel OR . Been in close contact with a person diagnosed with COVID-19 within the last 2 weeks and have fever, cough, and shortness of breath . IF YOU DO NOT MEET THESE CRITERIA, YOU ARE CONSIDERED LOW RISK FOR COVID-19.  What to do if you are HIGH RISK for COVID-19?  . If you are having a medical emergency, call 911. . Seek medical care right away. Before you go to a doctor's office, urgent care or emergency department, call ahead and tell them about  your recent travel, contact with someone diagnosed with COVID-19, and your symptoms. You should receive instructions from your physician's office regarding next steps of care.  . When you arrive at healthcare provider, tell the healthcare staff immediately you have returned from visiting [place], [place], [place], [place] or [place]; or traveled in the [place] to [place], [place], [place], or [place]; in the last two weeks or you have been in close contact with a person diagnosed with COVID-19 in the last 2 weeks.   . Tell the health care staff about your symptoms: fever, cough and shortness of breath. . After you have been seen by a medical provider, you will be either: o Tested for (COVID-19) and discharged home on quarantine except to seek medical care if symptoms worsen, and asked to  - Stay home and avoid contact with others until you get your results (4-5 days)  - Avoid travel on public transportation if possible (such as bus, train, or airplane) or o Sent to the Emergency Department by EMS for evaluation, COVID-19 testing, and possible admission depending on your condition and test results.  What to do if you are LOW RISK for COVID-19?  Reduce your risk of any infection by using the same precautions used for avoiding the common cold or flu:  . Wash your hands often with soap and warm water for at least 20 seconds.  If soap and water are not readily available, use an alcohol-based hand sanitizer with at least 60% alcohol.  . If coughing   or sneezing, cover your mouth and nose by coughing or sneezing into the elbow areas of your shirt or coat, into a tissue or into your sleeve (not your hands). . Avoid shaking hands with others and consider head nods or verbal greetings only. . Avoid touching your eyes, nose, or mouth with unwashed hands.  . Avoid close contact with people who are sick. . Avoid places or events with large numbers of people in one location, like concerts or sporting  events. . Carefully consider travel plans you have or are making. . If you are planning any travel outside or inside the [place], visit the CDC's Travelers' Health webpage for the latest health notices. . If you have some symptoms but not all symptoms, continue to monitor at home and seek medical attention if your symptoms worsen. . If you are having a medical emergency, call 911.   ADDITIONAL HEALTHCARE OPTIONS FOR PATIENTS  [facility] / e-Visit: [email or web address]         [facility]: [phone]   Urgent Care: [phone]                   [facility]: [phone]   

## 2018-08-05 NOTE — Patient Instructions (Signed)

## 2018-08-14 NOTE — Progress Notes (Signed)
The following biosimilar Ogivri (trastuzumab-dkst) has been selected for use in this patient.  Kennith Center, Pharm.D., CPP 08/14/2018@11 :30 AM

## 2018-08-17 NOTE — Progress Notes (Signed)
[name]   [phone] [phone]   Clinic Follow up Note   Patient Care Team: [name], [name], [name] as PCP - General (Physician Assistant) Debra Killings, MD as Consulting Physician (Orthopedic Surgery)  Date of Service:  08/19/2018  CHIEF COMPLAINT: F/u ofmetastatic breast cancer  SUMMARY OF ONCOLOGIC HISTORY: Oncology History Overview Note   Cancer Staging No matching staging information was found for the patient.     [name], right ([place])  11/1997 Cancer Diagnosis   Patient had 1.4 cm poorly differentiated right breast carcinoma diagnosed in Nov 1999 at age 53, 1/7 axillary nodes involved, ER/PR and HER 2 positive. She had mastectomy with the 7 axillary node evaluation, 4 cycles of adriamycin/ cytoxan followed by taxotere, then five years of tamoxifen thru June 2005 (no herceptin in 1999). She had bilateral oophorectomy in May 2005. She was briefly on aromatase inhibitor after tamoxifen, but discontinued this herself due to poor tolerance.   04/04/2015 Genetic Testing   Negative genetic testing on the breast/ovarian cancer pnael.  The Breast/Ovarian gene panel offered by [facility] includes sequencing and rearrangement analysis for the following 20 genes:  ATM, BARD1, BRCA1, BRCA2, BRIP1, CDH1, CHEK2, EPCAM, FANCC, MLH1, MSH2, MSH6, NBN, PALB2, PMS2, PTEN, RAD51C, RAD51D, TP53, and XRCC2.   06/26/2015 Pathology Results   Initial Path Report Bone, curettage, Left femur met, intramedullary subtroch tissue - METASTATIC ADENOCARCINOMA.  Estrogen Receptor: 95%, POSITIVE, STRONG STAINING INTENSITY Progesterone Receptor: 2%, POSITIVE, STRONG  HER2 (+), IHC 3+   06/26/2015 Surgery   Biopsy of metastatic tissue and stabilization with Affixus trochanteric nail, proximal and distal interlock of left femur by Dr. [name]    06/28/2015 Imaging   CT Chest w/ Contrast 1. Extensive right internal mammary chain  lymphadenopathy consistent with metastatic breast cancer. 2. Lytic metastases involving the posterior elements at T1 with probable nondisplaced pathologic fracture of the spinous process. 3. No other evidence of thoracic metastatic disease. 4. Trace bilateral pleural effusions with associated bibasilar atelectasis. 5. Indeterminate right thyroid nodule, not previously imaged. This could be evaluated with thyroid ultrasound as clinically warranted.   07/01/2015 Imaging   Bone Scan 1. Increased activity noted throughout the left femur. Although metastatic disease cannot be excluded, these changes are most likely secondary to prior surgery. 2. No other focal abnormalities identified to suggest metastatic disease.   07/12/2015 - 07/26/2015 Radiation Therapy   Left femur, 30 Gy in 10 fractions by Dr. Sondra Come    07/14/2015 Initial Diagnosis   [name], right ([place])   07/19/2015 Imaging   Initial PET Scan 1. Severe multifocal osseous metastatic disease, much greater than anticipated based on prior imaging. 2. Multifocal nodal metastases involving the right internal mammary, prevascular and subpectoral lymph nodes. No axillary pulmonary involvement identified. 3. No distant extra osseous metastases.   07/30/2015 - 11/29/2015 Chemotherapy   Docetaxel, Herceptin and Perjeta every 3 weeks X6 cycles, pt tolerated moderately well, restaging scan showed excellent response    09/18/2015 Imaging   CT Head w/wo Contrast 1. No acute intracranial abnormality or significant interval change. 2. Stable minimal periventricular white matter hypoattenuation on the right. This may reflect a remote ischemic injury. 3. No evidence for metastatic disease to the brain. 4. No focal soft tissue lesion to explain the patient's tenderness.   10/30/2015 Imaging   CT Abdomen Pelvis w/ Contrast 1. Few mildly prominent fluid-filled loops of small bowel scattered throughout the abdomen, with  with  intraluminal fluid density within the distal colon. Given the provided history, findings are suggestive of acute enteritis/diarrheal illness. 2. No other acute intra-abdominal or pelvic process identified. 3. Widespread osseous metastatic disease, better evaluated on most recent PET-CT from 07/19/2015. No pathologic fracture or other complication. 4. 11 mm hypodensity within the left kidney. This lesion measures intermediate density, and is indeterminate. While this lesion is similar in size relative to recent studies, this is increased in size relative to prior study from 2012. Further evaluation with dedicated renal mass protocol CT and/or MRI is recommended for complete characterization.   12/2015 - 05/14/2016 Chemotherapy   Maintenance Herceptin and pejeta every 3 weeks stopped on 05/14/16 due to disease progression   12/25/2015 Imaging   Restaging PET Scan 1. Markedly improved skeletal activity, with only several faint foci of residual accentuated metabolic activity, but resolution of the vast majority of the previously extensive osseous metastatic disease. 2. Resolution of the prior right internal mammary and right prevascular lymph nodes. 3. Residual subcutaneous edema and edema along fascia planes in the left upper thigh. This remains somewhat more than I would expect for placement of an IM nail 6 weeks ago. If there is leg swelling further down, consider Doppler venous ultrasound to rule out left lower extremity DVT. I do not perceive an obvious difference in density between the pelvic and common femoral veins on the noncontrast CT data. 4. Chronic right maxillary and right sphenoid sinusitis.   01/16/2016 - 10/20/2017 Anti-estrogen oral therapy   Letrozole 2.5 mg daily. She was switched to exemestane due to disease progression on 02/08/17. Stopped on 10/20/2017 when treatment changed to chemo    03/01/2016 Miscellaneous   Patient presented to ED following a fall; she reports she landed on her  back and is now experiencing back pain. The patient was evaluated and discharge home same day with pain control.   05/01/2016 Imaging   CT CAP IMPRESSION: 1. Stable exam.  No new or progressive disease identified. 2. No evidence for residual or recurrent adenopathy within the chest. 3. Stable bone metastasis.   05/01/2016 Imaging   BONE SCAN IMPRESSION: 1. Small focal uptake involving the anterior rib ends of the left second and right fourth ribs, new since the prior bone scan. The location of this uptake is more suggestive of a traumatic or inflammatory etiology as opposed to metastatic disease. No other evidence of metastatic disease. 2. There are other areas of stable uptake which are most likely degenerative/reactive, including right shoulder and cervical spine uptake and uptake along the right femur adjacent to the ORIF hardware.   05/20/2016 Imaging   NM PET Skull to Thigh IMPRESSION: 1. There multiple metastatic foci in the bony pelvis, including some new abnormal foci compare to the prior PET-CT, compatible with mildly progressive bony metastatic disease. Also, a left T11 vertebral hypermetabolic metastatic lesion is observed, new compared to the prior exam. 2. No active extraosseous malignancy is currently identified. 3. Right anterior fourth rib healing fracture, appears be   06/04/2016 - 01/08/2017 Chemotherapy   Second line chemotherapy Kadcyla every 3 weeks started on 06/04/16 and stopped on 01/08/17 due to mixed response.     10/05/2016 Imaging   CT CAP and Bone Scan:  Bones: left intramedullary rod and left femoral head neck screw in place. In the proximal diaphysis of the left femur there is cortical thickening and irregularity. No lytic or blastic bone lesions. No fracture or vertebral endplate destruction.   No definite evidence  of recurrent disease in the chest, abdomen, or pelvis.   10/28/2016 Imaging   CT A/P IMPRESSION: No acute process demonstrated in  the abdomen or pelvis. No evidence of bowel obstruction or inflammation.   11/04/2016 Imaging   DG foot complete left: IMPRESSION: No fracture or dislocation.  No soft tissue abnormality   11/23/2016 Imaging   CT RIGHT FEMUR IMPRESSION: Negative CT scan of the right thigh.  No visible metastatic disease.  CT LEFT FEMUR IMPRESSION: 1. Postsurgical changes related to prior cephalomedullary rod fixation of the left femur with linear lucency along the anterolateral proximal femoral cortex at level of the lesser trochanter, suspicious for nondisplaced fracture. 2. Slightly permeative appearance of the proximal left femoral cortex at the site of known prior osseous metastases is largely unchanged. 3. Unchanged patchy lucency and sclerosis along the anterior acetabulum, which appears to correspond with an area of faint uptake on the prior PET-CT, suspicious for osseous metastasis. These results will be called to the ordering clinician or representative by the Radiologist Assistant, and communication documented in the PACS or zVision Dashboard.     01/27/2017 Imaging   IMPRESSION: 1. Mixed response to osseous metastasis. Some hypermetabolic lesions are new and increasingly hypermetabolic, while another index lesion is decreasingly hypermetabolic. 2. No evidence of hypermetabolic soft tissue metastasis.    02/08/2017 - 10/19/2017 Antibody Plan   -Herceptin every 3 week since 02/08/17 -Oral Lanpantinib 1588m and decreased to 5060mdue to diarrhea and depression. Due to disease progression she swithced to Verzenio 15022mn 05/04/17. She did not toelrate well so she was switched to Ibrance 100m58m 05/31/17. Due to her neutropenia, Ibrance dose reduced to 75 mg daily, 3 weeks on, one-week off, stopped on 10/20/2017 due to disease progression.    04/30/2017 PET scan   IMPRESSION: 1. Primarily increase in hypermetabolism of osseous metastasis. An isolated right acetabular lesion is  decreased in hypermetabolism since the prior exam. 2. No evidence of hypermetabolic soft tissue metastasis. 3. Similar non FDG avid right-sided thyroid nodule, favoring a benign etiology. Recommend attention on follow-up.   08/30/2017 PET scan   08/30/2017 PET Scan  IMPRESSION: 1. Worsening osseous metastatic disease. 2. Focal hypermetabolism in the central spinal canal the L1 level, similar to the prior exam. Metastatic implant cannot be excluded. 3. Slight enlargement of a right thyroid nodule which now appears more cystic in character. Consider further evaluation with thyroid ultrasound. If patient is clinically hyperthyroid, consider nuclear medicine thyroid uptake and scan   08/30/2017 Progression   08/30/2017 PET Scan  IMPRESSION: 1. Worsening osseous metastatic disease. 2. Focal hypermetabolism in the central spinal canal the L1 level, similar to the prior exam. Metastatic implant cannot be excluded. 3. Slight enlargement of a right thyroid nodule which now appears more cystic in character. Consider further evaluation with thyroid ultrasound. If patient is clinically hyperthyroid, consider nuclear medicine thyroid uptake and scan   09/29/2017 - 10/11/2017 Radiation Therapy   Pt received 27 Gy in 9 fractions directed at the areas of significant uptake noted on recent bone scan the right pelvis and right proximal femur, with Dr, KinaSondra Come10/09/2017 - 04/07/2018 Chemotherapy   -Herceptin every 3 weeks starting 02/08/17, perjeta added on 10/20/2017  -weekly Taxol started on 10/21/2017. Will reduce Taxol to 2 weeks on, 1 week off starting on 02/04/17.  Stoped due to mild disease progression   10/27/2017 Imaging   10/27/2017 CT AP IMPRESSION: 1. No acute abnormality. No evidence of bowel obstruction or  inflammation. Normal appendix. 2. Stable patchy sclerotic osseous metastases throughout the visualized skeleton. No new or progressive metastatic disease in the abdomen  or pelvis.   01/11/2018 Imaging   Whole Body Bone Scan 01/11/18  IMPRESSION: 1. Multifocal osseous metastases as described. 2. Right scapular metastasis. 3. Right superior thoracic spinous process. 4. Right sacral ala and right L5 metastases. 5. Right acetabular metastasis. 6. Right proximal femur metastasis. 7. Diffuse metastases throughout the left femur. 8. Anterior right fourth rib metastasis.   01/11/2018 Imaging   CT CAP W Contrast 01/11/18  IMPRESSION: 1. Similar-appearing osseous metastatic disease. 2. No evidence for progressed metastatic disease in the chest, abdomen or pelvis.   03/30/2018 Imaging   Bone Scan 03/30/18  IMPRESSION: Stable multifocal bony metastatic disease.   03/30/2018 Imaging    CT CAP W Contrast 03/30/18 IMPRESSION: 1. New mild contiguous wall thickening with associated mucosal hyperenhancement throughout the left colon and rectum, suggesting nonspecific infectious or inflammatory proctocolitis. Differential includes C diff colitis. 2. Stable faint patchy sclerosis in the iliac bones and lumbar vertebral bodies. No new or progressive osseous metastatic disease by CT. Please note that the osseous lesions are relatively occult by CT and would be better evaluated by PET-CT, as clinically warranted. 3. No evidence of extra osseous metastatic disease.  These results will be called to the ordering clinician or representative by the Radiologist Assistant, and communication documented in the PACS or zVision Dashboard.   04/15/2018 - 06/16/2018 Chemotherapy   Enhertu q3weeks starting 04/15/18. Stopped Enhertu after cycle 3 on 05/27/18 due to poor toleration.    04/21/2018 PET scan   PET 04/21/18  IMPRESSION: 1. Overall marked interval improvement in bony hypermetabolic disease. All of the previously identified hypermetabolic osseous metastases have decreased in hypermetabolism in the interval. There is a new tiny focus of hypermetabolism in the right aspect  of the L3 vertebral body that appears to correspond to a new 8 mm lucency, raising concern for new metastatic disease. Close attention on follow-up recommended. 2. Focal hypermetabolism in the central spinal canal at the level of L1 previously has resolved. 3. No hypermetabolic soft tissue disease on today's study. 4. Right thyroid nodule seen previously has decreased in the interval.    06/16/2018 Imaging   CT LUMBAR SPINE WO CONTRAST 06/16/18 IMPRESSION: 1. Transitional lumbosacral anatomy. Correlation with radiographs is recommended prior to any operative intervention. 2. Suspicion of a right L3 vertebral body metastasis on the April PET-CT is not evident by CT. Stable degenerative versus metastatic lesion at the right L5 superior endplate. Small but increased right iliac bone metastasis since March. Lumbar MRI would best characterize lumbar metastatic disease (without contrast if necessary). 3. Suspect L2-L3 left subarticular segment disc herniation. Query left L2 radiculitis. 4. Stable appearance of rightward L4-L5 disc degeneration which could affect the right foramen and right lateral recess.   06/17/2018 -  Chemotherapy   Restart Taxol 2 weeks on/1 week off with Herceptin and Perjeta q3weeks on 06/17/18   07/06/2018 PET scan   PET  IMPRESSION: 1. Near total resolution of hypermetabolic activity associated with skeletal metastasis. Very mild activity associated with the LEFT iliac bone which is similar to background blood pool activity. 2. No new or progressive disease.      CURRENT THERAPY:  Restart Taxol 2 weeks on/1 week off with Herceptin and Perjeta q3weeks on 06/17/18. Dose reduce Perjeta with cycle 5 due to diarrhea. Perjeta stopped after 07/29/18 due to persistent diarrhea.    INTERVAL HISTORY:  JEANETTA ALONZO is here for a follow up and ongoing treatment. She presents to the clinic with her interpretor. She notes she is doing well. She notes left leg pain and  swelling is stable and tolerable. She notes Perjeta still causes diarrhea with dose reduction along with metal taste. She feels she is eating well and has energy for activities. She knows when to rest.  She takes oral potassium 2-3 tabs a day.     REVIEW OF SYSTEMS:   Constitutional: Denies fevers, chills or abnormal weight loss (+) taste change  Eyes: Denies blurriness of vision Ears, nose, mouth, throat, and face: Denies mucositis or sore throat Respiratory: Denies cough, dyspnea or wheezes Cardiovascular: Denies palpitation, chest discomfort or lower extremity swelling Gastrointestinal:  Denies nausea, heartburn (+) diarrhea  Skin: Denies abnormal skin rashes MSK: (+) Chronic left leg pain with swelling, stable  Lymphatics: Denies new lymphadenopathy or easy bruising Neurological:Denies numbness, tingling or new weaknesses Behavioral/Psych: Mood is stable, no new changes  All other systems were reviewed with the patient and are negative.  MEDICAL HISTORY:  Past Medical History:  Diagnosis Date   Abnormal Pap smear    Anxiety    Breast cancer ([name])    Cancer ([place]) 1999   breast-s/p mastectomy, chemo, rad   CIN I (cervical intraepithelial neoplasia I) 2003   by colpo   Congenital deafness    Deaf    Depression    Port-A-Cath in place 2017   for chemotherapy   Radiation 07/12/15-07/26/15   left femur 30 Gy   S/P bilateral oophorectomy     SURGICAL HISTORY: Past Surgical History:  Procedure Laterality Date   ABDOMINAL HYSTERECTOMY     INTRAMEDULLARY (IM) NAIL INTERTROCHANTERIC Left 06/26/2015   Procedure: LEFT BIOMET LONG AFFIXS NAIL;  Surgeon: Debra Killings, MD;  Location: [facility];  Service: Orthopedics;  Laterality: Left;   IR GENERIC HISTORICAL  08/06/2015   IR US GUIDE VASC ACCESS LEFT 08/06/2015 Aletta Edouard, MD [facility]   IR GENERIC HISTORICAL  08/06/2015   IR FLUORO GUIDE CV LINE LEFT 08/06/2015 Aletta Edouard, MD [facility]   IR GENERIC  HISTORICAL  03/05/2016   IR CV LINE INJECTION 03/05/2016 Markus Daft, MD [facility]   LEFT HEART CATH AND CORONARY ANGIOGRAPHY N/A 12/13/2017   Procedure: LEFT HEART CATH AND CORONARY ANGIOGRAPHY;  Surgeon: Leonie Man, MD;  Location: [facility];  Service: Cardiovascular;  Laterality: N/A;   MASTECTOMY     right breast   MASTECTOMY     OVARIAN CYST REMOVAL     RADIOLOGY WITH ANESTHESIA Left 06/25/2015   Procedure: MRI OF LEFT HIP WITH OR WITHOUT CONTRAST;  Surgeon: Medication Radiologist, MD;  Location: [facility];  Service: Radiology;  Laterality: Left;  DR. MCINTYRE/MRI   TUBAL LIGATION      I have reviewed the social history and family history with the patient and they are unchanged from previous note.  ALLERGIES:  is allergic to chlorhexidine; promethazine hcl; and diphenhydramine hcl.  MEDICATIONS:  Current Outpatient Medications  Medication Sig Dispense Refill   albuterol (PROVENTIL HFA;VENTOLIN HFA) 108 (90 Base) MCG/ACT inhaler Inhale 2 puffs into the lungs every 6 (six) hours as needed for wheezing or shortness of breath. 1 Inhaler 0   ALPRAZolam (XANAX) 0.25 MG tablet Take 1 tablet (0.25 mg total) by mouth 2 (two) times daily as needed for anxiety. 30 tablet 0   dexamethasone (DECADRON) 4 MG tablet Take 1 tablet (4 mg total)  by mouth daily. 20 tablet 0   diphenoxylate-atropine (LOMOTIL) 2.5-0.025 MG tablet Take 1-2 tablets by mouth 4 (four) times daily as needed for diarrhea or loose stools. 30 tablet 1   lidocaine-prilocaine (EMLA) cream Apply 1 application topically as needed. Apply to Porta-Cath 1-2 hours prior to access as directed. 30 g 2   methocarbamol (ROBAXIN) 500 MG tablet Take 1 tablet (500 mg total) by mouth 2 (two) times daily. 20 tablet 0   omeprazole (PRILOSEC) 20 MG capsule Take 1 capsule (20 mg total) by mouth daily. 30 capsule 0   ondansetron (ZOFRAN) 8 MG tablet Take 1 tablet (8 mg total) by mouth every 8 (eight) hours as needed for nausea or  vomiting. 45 tablet 1   oxyCODONE (ROXICODONE) 5 MG immediate release tablet Take 1 tablet (5 mg total) by mouth every 6 (six) hours as needed for severe pain. 90 tablet 0   potassium chloride (K-DUR) 10 MEQ tablet Take 2 tablets (20 mEq total) by mouth 2 (two) times daily. 180 tablet 3   prochlorperazine (COMPAZINE) 10 MG tablet Take 1 tablet (10 mg total) by mouth every 6 (six) hours as needed for nausea or vomiting. 60 tablet 2   zolpidem (AMBIEN) 5 MG tablet Take 1.5 tablets (7.5 mg total) by mouth at bedtime as needed for sleep. 45 tablet 0   No current facility-administered medications for this visit.    Facility-Administered Medications Ordered in Other Visits  Medication Dose Route Frequency Provider Last Rate Last Dose   potassium chloride SA (K-DUR) CR tablet 40 mEq  40 mEq Oral Once Truitt Merle, MD       sodium chloride 0.9 % 1,000 mL with potassium chloride 10 mEq infusion   Intravenous Continuous Livesay, Tamala Julian, MD   Stopped at 09/10/15 1653   sodium chloride 0.9 % injection 10 mL  10 mL Intravenous PRN Livesay, Lennis P, MD        PHYSICAL EXAMINATION: ECOG PERFORMANCE STATUS: 1 - Symptomatic but completely ambulatory  Vitals:   08/19/18 0857  BP: 121/76  Pulse: 72  Resp: 18  Temp: 98.3 F (36.8 C)  SpO2: 100%   Filed Weights   08/19/18 0857  Weight: 133 lb (60.3 kg)    GENERAL:alert, no distress and comfortable SKIN: skin color, texture, turgor are normal, no rashes or significant lesions EYES: normal, Conjunctiva are pink and non-injected, sclera clear  NECK: supple, thyroid normal size, non-tender, without nodularity LYMPH:  no palpable lymphadenopathy in the cervical, axillary  LUNGS: clear to auscultation and percussion with normal breathing effort HEART: regular rate & rhythm and no murmurs and no lower extremity edema ABDOMEN:abdomen soft, non-tender and normal bowel sounds Musculoskeletal:no cyanosis of digits and no clubbing  NEURO: alert &  oriented x 3 with fluent speech, no focal motor/sensory deficits  LABORATORY DATA:  I have reviewed the data as listed CBC Latest Ref Rng & Units 08/19/2018 08/05/2018 07/29/2018  WBC 4.0 - 10.5 K/uL 3.3(L) 3.9(L) 3.2(L)  Hemoglobin 12.0 - 15.0 g/dL 10.3(L) 10.8(L) 9.8(L)  Hematocrit 36.0 - 46.0 % 30.4(L) 31.7(L) 28.9(L)  Platelets 150 - 400 K/uL 174 207 170     CMP Latest Ref Rng & Units 08/19/2018 08/05/2018 07/29/2018  Glucose 70 - 99 mg/dL 72 90 82  BUN 6 - 20 mg/dL _0 Creatinine 0.44 - 1.00 mg/dL 0.79 0.85 0.75  Sodium 135 - 145 mmol/L 142 140 142  Potassium 3.5 - 5.1 mmol/L 3.0(LL) 3.2(L) 3.0(LL)  Chloride 98 -  111 mmol/L 108 106 106  CO2 22 - 32 mmol/L 19(L) 23 27  Calcium 8.9 - 10.3 mg/dL 8.7(L) 8.9 8.4(L)  Total Protein 6.5 - 8.1 g/dL 6.0(L) 6.4(L) 5.9(L)  Total Bilirubin 0.3 - 1.2 mg/dL 1.6(H) 1.4(H) 1.7(H)  Alkaline Phos 38 - 126 U/L 61 57 57  AST 15 - 41 U/L 13(L) 13(L) 13(L)  ALT 0 - 44 U/L _0 RADIOGRAPHIC STUDIES: I have personally reviewed the radiological images as listed and agreed with the findings in the report. No results found.   ASSESSMENT & PLAN:  DEIRDRE GRYDER is a 53 y.o. 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 Vernia Buff recently [employer], Herceptin and Perjetaq3weeks,tolerated moderately well, had overall excellent response based on PET in 04/2018 -Shecompleted 3 cycles of Enhertu and stopped due to poor toleration with anorexia, weight loss, N&V, diarrhea -I restarted her onTaxol 2 weeks on/1 week off with Herceptin and Perjeta q3weekson 06/17/18, she tolerated first cycle well then developed diarrhea secondary to Perjeta, will dose reduce. I d/c benadryl given allergy. Due to persistent diarrhea,  Perjeta was stopped after C5 on 07/29/18.  -Labs reviewed, CBC and CMP WNL except WBC 3.3, Hg 10.3, K 3, Ca 8.7, protein 6, albumin 3.4, tbili 1.6. Overall adequate to proceed with Taxol/Herceptin today  -she will return next week for taxol  -F/u in 3 weeks -if she is clinically stable, plan to repeat scan in late Sep or Oct    2. Body pain, secondary to bone mets -since her cancer diagnosis,she has had b/l hip, shoulder and chest painat different time points, likely related to her bone mets -Ipreviouslyprescribed herGabapentin, butshedecided not to take due to potential side effect of suicidal thoughts. 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  -She notes her back pain is much improved and very well controlled now. Her chronic left leg pain is table and tolerable. Continue pain meds. -Will monitor for need of palliative radiation.  3.Congenital deafness  4.Mild anemia, secondary to chemotherapy and malignancy -She will start with oral iron daily for 1-2 months and then stop. She will also start daily women's vitamin. -Hgat10.3(08/19/18)   5.Hypokalemia, Diarrhea  -He has intermittent hyperkalemia, on oral supplementKCL. -Ipreviouslyinstructed her to reduce dairy products in diet. -She developed diarrhea s/p cycle 3 Perjeta. Will dose reduce.  -Kat3.0(08/19/18).will increase oral Potassiumto 20mq 4 times a day (at least TID). Will give IV potassium 252m  today as well.   6.Goal of care discussion -she understand her goal of care is palliative to prolong her life -she is full code for now  7.Insomnia -She cannot take Benadryl due to allergy reaction. She has tried trazodone with no relief.  -Her insomnia is not currently controlled with 8m78mmbien. Ipreviouslyincrease her to7.8mg31m night. -Due to recent shoulder and hip pain she hashad toincrease Ambien to 10mg30m help her fall asleep.I discussed that is the highest dose and she should not use 10mg 8my night. -I advised her to avoid taking Ambien, oxycodone and Xanax all at the same time as this is too much sedation. Oxycodone can be taken with Ambien or with Xanax. She understands. -Stable.    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 -Her  neuropathy is currently mild and stable in her feet and LE, will monitor on treatment. Stable    PLAN: -Will stop Perjeta due to diarrhea  -Will give IV Potassium 9mq today. She will increase oral potasisum to four tabs a day -Lab reviewed, overall adequate to proceed with Taxol, Herceptintoday  -she will return next week for Taxol -Lab, flush,f/u andtaxol/Herceptinin3 weeks    No problem-specific Assessment & Plan notes found for this encounter.   No orders of the defined types were placed in this encounter.  All questions were answered. The patient knows to call the clinic with any problems, questions or concerns. No barriers to learning was detected. I spent 20 minutes counseling the patient face to face. The total time spent in the appointment was 25 minutes and more than 50% was on counseling and review of test results     YTruitt Merle MD 08/19/2018   I, AJoslyn Devon am acting as scribe for YTruitt Merle MD.   I have reviewed the above documentation for accuracy and completeness, and I agree with the above.

## 2018-08-19 ENCOUNTER — Inpatient Hospital Stay: Payer: Medicaid Other

## 2018-08-19 ENCOUNTER — Inpatient Hospital Stay (HOSPITAL_BASED_OUTPATIENT_CLINIC_OR_DEPARTMENT_OTHER): Payer: Medicaid Other | Admitting: Hematology

## 2018-08-19 ENCOUNTER — Other Ambulatory Visit: Payer: Self-pay

## 2018-08-19 ENCOUNTER — Inpatient Hospital Stay: Payer: Medicaid Other | Attending: Hematology

## 2018-08-19 VITALS — BP 121/76 | HR 72 | Temp 98.3°F | Resp 18 | Ht 60.5 in | Wt 133.0 lb

## 2018-08-19 DIAGNOSIS — E876 Hypokalemia: Secondary | ICD-10-CM | POA: Diagnosis not present

## 2018-08-19 DIAGNOSIS — Z5112 Encounter for antineoplastic immunotherapy: Secondary | ICD-10-CM | POA: Insufficient documentation

## 2018-08-19 DIAGNOSIS — Z79899 Other long term (current) drug therapy: Secondary | ICD-10-CM | POA: Diagnosis not present

## 2018-08-19 DIAGNOSIS — C7951 Secondary malignant neoplasm of bone: Secondary | ICD-10-CM | POA: Diagnosis not present

## 2018-08-19 DIAGNOSIS — C50911 Malignant neoplasm of unspecified site of right female breast: Secondary | ICD-10-CM | POA: Diagnosis not present

## 2018-08-19 DIAGNOSIS — F419 Anxiety disorder, unspecified: Secondary | ICD-10-CM | POA: Insufficient documentation

## 2018-08-19 DIAGNOSIS — E041 Nontoxic single thyroid nodule: Secondary | ICD-10-CM | POA: Insufficient documentation

## 2018-08-19 DIAGNOSIS — E875 Hyperkalemia: Secondary | ICD-10-CM | POA: Diagnosis not present

## 2018-08-19 DIAGNOSIS — D6481 Anemia due to antineoplastic chemotherapy: Secondary | ICD-10-CM | POA: Insufficient documentation

## 2018-08-19 DIAGNOSIS — Z90710 Acquired absence of both cervix and uterus: Secondary | ICD-10-CM | POA: Diagnosis not present

## 2018-08-19 DIAGNOSIS — Z79811 Long term (current) use of aromatase inhibitors: Secondary | ICD-10-CM | POA: Insufficient documentation

## 2018-08-19 DIAGNOSIS — Z9221 Personal history of antineoplastic chemotherapy: Secondary | ICD-10-CM | POA: Diagnosis not present

## 2018-08-19 DIAGNOSIS — Z5111 Encounter for antineoplastic chemotherapy: Secondary | ICD-10-CM | POA: Diagnosis not present

## 2018-08-19 DIAGNOSIS — Z9011 Acquired absence of right breast and nipple: Secondary | ICD-10-CM | POA: Insufficient documentation

## 2018-08-19 DIAGNOSIS — F329 Major depressive disorder, single episode, unspecified: Secondary | ICD-10-CM | POA: Diagnosis not present

## 2018-08-19 DIAGNOSIS — G4700 Insomnia, unspecified: Secondary | ICD-10-CM

## 2018-08-19 DIAGNOSIS — T451X5A Adverse effect of antineoplastic and immunosuppressive drugs, initial encounter: Secondary | ICD-10-CM | POA: Diagnosis not present

## 2018-08-19 DIAGNOSIS — Z170 Estrogen receptor positive status [ER+]: Secondary | ICD-10-CM | POA: Insufficient documentation

## 2018-08-19 DIAGNOSIS — Z923 Personal history of irradiation: Secondary | ICD-10-CM | POA: Insufficient documentation

## 2018-08-19 DIAGNOSIS — Z95828 Presence of other vascular implants and grafts: Secondary | ICD-10-CM

## 2018-08-19 DIAGNOSIS — H905 Unspecified sensorineural hearing loss: Secondary | ICD-10-CM | POA: Diagnosis not present

## 2018-08-19 LAB — CMP (CANCER CENTER ONLY)
ALT: 13 U/L (ref 0–44)
AST: 13 U/L — ABNORMAL LOW (ref 15–41)
Albumin: 3.4 g/dL — ABNORMAL LOW (ref 3.5–5.0)
Alkaline Phosphatase: 61 U/L (ref 38–126)
Anion gap: 15 (ref 5–15)
BUN: 6 mg/dL (ref 6–20)
CO2: 19 mmol/L — ABNORMAL LOW (ref 22–32)
Calcium: 8.7 mg/dL — ABNORMAL LOW (ref 8.9–10.3)
Chloride: 108 mmol/L (ref 98–111)
Creatinine: 0.79 mg/dL (ref 0.44–1.00)
GFR, Est AFR Am: >60 mL/min (ref >60)
GFR, Estimated: >60 mL/min (ref >60)
Glucose, Bld: 72 mg/dL (ref 70–99)
Potassium: 3 mmol/L — CL (ref 3.5–5.1)
Sodium: 142 mmol/L (ref 135–145)
Total Bilirubin: 1.6 mg/dL — ABNORMAL HIGH (ref 0.3–1.2)
Total Protein: 6 g/dL — ABNORMAL LOW (ref 6.5–8.1)

## 2018-08-19 LAB — CBC WITH DIFFERENTIAL (CANCER CENTER ONLY)
Abs Immature Granulocytes: 0.04 10*3/uL (ref 0.00–0.07)
Basophils Absolute: 0 10*3/uL (ref 0.0–0.1)
Basophils Relative: 1 %
Eosinophils Absolute: 0 10*3/uL (ref 0.0–0.5)
Eosinophils Relative: 1 %
HCT: 30.4 % — ABNORMAL LOW (ref 36.0–46.0)
Hemoglobin: 10.3 g/dL — ABNORMAL LOW (ref 12.0–15.0)
Immature Granulocytes: 1 %
Lymphocytes Relative: 29 %
Lymphs Abs: 1 10*3/uL (ref 0.7–4.0)
MCH: 30.2 pg (ref 26.0–34.0)
MCHC: 33.9 g/dL (ref 30.0–36.0)
MCV: 89.1 fL (ref 80.0–100.0)
Monocytes Absolute: 0.4 10*3/uL (ref 0.1–1.0)
Monocytes Relative: 12 %
Neutro Abs: 1.9 10*3/uL (ref 1.7–7.7)
Neutrophils Relative %: 56 %
Platelet Count: 174 10*3/uL (ref 150–400)
RBC: 3.41 MIL/uL — ABNORMAL LOW (ref 3.87–5.11)
RDW: 13.4 % (ref 11.5–15.5)
WBC Count: 3.3 10*3/uL — ABNORMAL LOW (ref 4.0–10.5)
nRBC: 0 % (ref 0.0–0.2)

## 2018-08-19 MED ORDER — LORATADINE 10 MG PO TABS
10.0000 mg | ORAL_TABLET | Freq: Once | ORAL | Status: AC
  Administered 2018-08-19: 10 mg via ORAL

## 2018-08-19 MED ORDER — SODIUM CHLORIDE 0.9% FLUSH
10.0000 mL | INTRAVENOUS | Status: DC | PRN
  Administered 2018-08-19: 10 mL via INTRAVENOUS
  Filled 2018-08-19: qty 10

## 2018-08-19 MED ORDER — SODIUM CHLORIDE 0.9% FLUSH
10.0000 mL | INTRAVENOUS | Status: DC | PRN
  Administered 2018-08-19: 10 mL
  Filled 2018-08-19: qty 10

## 2018-08-19 MED ORDER — ACETAMINOPHEN 325 MG PO TABS
650.0000 mg | ORAL_TABLET | Freq: Once | ORAL | Status: AC
  Administered 2018-08-19: 650 mg via ORAL

## 2018-08-19 MED ORDER — DEXAMETHASONE SODIUM PHOSPHATE 10 MG/ML IJ SOLN
10.0000 mg | Freq: Once | INTRAMUSCULAR | Status: AC
  Administered 2018-08-19: 10 mg via INTRAVENOUS

## 2018-08-19 MED ORDER — POTASSIUM CHLORIDE 10 MEQ/100ML IV SOLN
10.0000 meq | INTRAVENOUS | Status: AC
  Administered 2018-08-19: 10 meq via INTRAVENOUS
  Filled 2018-08-19: qty 100

## 2018-08-19 MED ORDER — DEXAMETHASONE SODIUM PHOSPHATE 10 MG/ML IJ SOLN
INTRAMUSCULAR | Status: AC
  Filled 2018-08-19: qty 1

## 2018-08-19 MED ORDER — LORATADINE 10 MG PO TABS
ORAL_TABLET | ORAL | Status: AC
  Filled 2018-08-19: qty 1

## 2018-08-19 MED ORDER — TRASTUZUMAB CHEMO 150 MG IV SOLR
6.0000 mg/kg | Freq: Once | INTRAVENOUS | Status: AC
  Administered 2018-08-19: 357 mg via INTRAVENOUS
  Filled 2018-08-19: qty 17

## 2018-08-19 MED ORDER — SODIUM CHLORIDE 0.9 % IV SOLN
Freq: Once | INTRAVENOUS | Status: AC
  Administered 2018-08-19: 10:00:00 via INTRAVENOUS
  Filled 2018-08-19: qty 250

## 2018-08-19 MED ORDER — SODIUM CHLORIDE 0.9 % IV SOLN
60.0000 mg/m2 | Freq: Once | INTRAVENOUS | Status: AC
  Administered 2018-08-19: 96 mg via INTRAVENOUS
  Filled 2018-08-19: qty 16

## 2018-08-19 MED ORDER — HEPARIN SOD (PORK) LOCK FLUSH 100 UNIT/ML IV SOLN
500.0000 [IU] | Freq: Once | INTRAVENOUS | Status: AC | PRN
  Administered 2018-08-19: 500 [IU]
  Filled 2018-08-19: qty 5

## 2018-08-19 MED ORDER — FAMOTIDINE IN NACL 20-0.9 MG/50ML-% IV SOLN
20.0000 mg | Freq: Once | INTRAVENOUS | Status: AC
  Administered 2018-08-19: 20 mg via INTRAVENOUS

## 2018-08-19 MED ORDER — ACETAMINOPHEN 325 MG PO TABS
ORAL_TABLET | ORAL | Status: AC
  Filled 2018-08-19: qty 2

## 2018-08-19 MED ORDER — FAMOTIDINE IN NACL 20-0.9 MG/50ML-% IV SOLN
INTRAVENOUS | Status: AC
  Filled 2018-08-19: qty 50

## 2018-08-19 NOTE — Patient Instructions (Signed)

## 2018-08-19 NOTE — Progress Notes (Signed)
Per Dr. Burr Medico okay to treat with Total Bilirubin 1.6

## 2018-08-19 NOTE — Progress Notes (Signed)
Patient with K of 3 this AM. Received verbal order from Dr. Burr Medico for patient to receive 2 x 10 mEq potassium chloride IVPB (total dose 20 mEq) during treatment today. Orders have been updated.  Leron Croak, PharmD PGY2 Oncology/Hematology Pharmacy Resident 08/19/2018 10:02 AM

## 2018-08-19 NOTE — Patient Instructions (Signed)
[facility] Discharge Instructions for Patients Receiving Chemotherapy  Today you received the following chemotherapy agents:  Herceptin and Taxol.  To help prevent nausea and vomiting after your treatment, we encourage you to take your nausea medication as directed.   If you develop nausea and vomiting that is not controlled by your nausea medication, call the clinic.   BELOW ARE SYMPTOMS THAT SHOULD BE REPORTED IMMEDIATELY:  *FEVER GREATER THAN 100.5 F  *CHILLS WITH OR WITHOUT FEVER  NAUSEA AND VOMITING THAT IS NOT CONTROLLED WITH YOUR NAUSEA MEDICATION  *UNUSUAL SHORTNESS OF BREATH  *UNUSUAL BRUISING OR BLEEDING  TENDERNESS IN MOUTH AND THROAT WITH OR WITHOUT PRESENCE OF ULCERS  *URINARY PROBLEMS  *BOWEL PROBLEMS  UNUSUAL RASH Items with * indicate a potential emergency and should be followed up as soon as possible.  Feel free to call the clinic should you have any questions or concerns. The clinic phone number is [phone].  Please show the CHEMO ALERT CARD at check-in to the Emergency Department and triage nurse.   

## 2018-08-20 ENCOUNTER — Encounter: Payer: Self-pay | Admitting: Hematology

## 2018-08-26 ENCOUNTER — Inpatient Hospital Stay: Payer: Medicaid Other

## 2018-08-26 ENCOUNTER — Other Ambulatory Visit: Payer: Self-pay

## 2018-08-26 ENCOUNTER — Other Ambulatory Visit: Payer: Self-pay | Admitting: Hematology

## 2018-08-26 VITALS — BP 108/70 | HR 67 | Temp 98.7°F | Resp 18

## 2018-08-26 DIAGNOSIS — C7951 Secondary malignant neoplasm of bone: Secondary | ICD-10-CM

## 2018-08-26 DIAGNOSIS — C50911 Malignant neoplasm of unspecified site of right female breast: Secondary | ICD-10-CM

## 2018-08-26 DIAGNOSIS — Z5112 Encounter for antineoplastic immunotherapy: Secondary | ICD-10-CM | POA: Diagnosis not present

## 2018-08-26 DIAGNOSIS — C50919 Malignant neoplasm of unspecified site of unspecified female breast: Secondary | ICD-10-CM

## 2018-08-26 LAB — CBC WITH DIFFERENTIAL (CANCER CENTER ONLY)
Abs Immature Granulocytes: 0.11 10*3/uL — ABNORMAL HIGH (ref 0.00–0.07)
Basophils Absolute: 0 10*3/uL (ref 0.0–0.1)
Basophils Relative: 1 %
Eosinophils Absolute: 0.1 10*3/uL (ref 0.0–0.5)
Eosinophils Relative: 2 %
HCT: 30.8 % — ABNORMAL LOW (ref 36.0–46.0)
Hemoglobin: 10.5 g/dL — ABNORMAL LOW (ref 12.0–15.0)
Immature Granulocytes: 3 %
Lymphocytes Relative: 23 %
Lymphs Abs: 0.9 10*3/uL (ref 0.7–4.0)
MCH: 30.5 pg (ref 26.0–34.0)
MCHC: 34.1 g/dL (ref 30.0–36.0)
MCV: 89.5 fL (ref 80.0–100.0)
Monocytes Absolute: 0.3 10*3/uL (ref 0.1–1.0)
Monocytes Relative: 7 %
Neutro Abs: 2.4 10*3/uL (ref 1.7–7.7)
Neutrophils Relative %: 64 %
Platelet Count: 193 10*3/uL (ref 150–400)
RBC: 3.44 MIL/uL — ABNORMAL LOW (ref 3.87–5.11)
RDW: 13.3 % (ref 11.5–15.5)
WBC Count: 3.8 10*3/uL — ABNORMAL LOW (ref 4.0–10.5)
nRBC: 0 % (ref 0.0–0.2)

## 2018-08-26 LAB — CMP (CANCER CENTER ONLY)
ALT: 15 U/L (ref 0–44)
AST: 13 U/L — ABNORMAL LOW (ref 15–41)
Albumin: 3.4 g/dL — ABNORMAL LOW (ref 3.5–5.0)
Alkaline Phosphatase: 61 U/L (ref 38–126)
Anion gap: 10 (ref 5–15)
BUN: 8 mg/dL (ref 6–20)
CO2: 23 mmol/L (ref 22–32)
Calcium: 9 mg/dL (ref 8.9–10.3)
Chloride: 106 mmol/L (ref 98–111)
Creatinine: 0.8 mg/dL (ref 0.44–1.00)
GFR, Est AFR Am: >60 mL/min (ref >60)
GFR, Estimated: >60 mL/min (ref >60)
Glucose, Bld: 91 mg/dL (ref 70–99)
Potassium: 3.5 mmol/L (ref 3.5–5.1)
Sodium: 139 mmol/L (ref 135–145)
Total Bilirubin: 1.7 mg/dL — ABNORMAL HIGH (ref 0.3–1.2)
Total Protein: 6.2 g/dL — ABNORMAL LOW (ref 6.5–8.1)

## 2018-08-26 MED ORDER — LIDOCAINE-PRILOCAINE 2.5-2.5 % EX CREA: 1.0000 "application " | TOPICAL_CREAM | CUTANEOUS | 2 refills | Status: DC | PRN

## 2018-08-26 MED ORDER — DEXAMETHASONE SODIUM PHOSPHATE 10 MG/ML IJ SOLN
10.0000 mg | Freq: Once | INTRAMUSCULAR | Status: AC
  Administered 2018-08-26: 10 mg via INTRAVENOUS

## 2018-08-26 MED ORDER — DEXAMETHASONE SODIUM PHOSPHATE 10 MG/ML IJ SOLN
INTRAMUSCULAR | Status: AC
  Filled 2018-08-26: qty 1

## 2018-08-26 MED ORDER — ZOLPIDEM TARTRATE 5 MG PO TABS: 7.5000 mg | ORAL_TABLET | Freq: Every evening | ORAL | 0 refills | Status: DC | PRN

## 2018-08-26 MED ORDER — SODIUM CHLORIDE 0.9 % IV SOLN
60.0000 mg/m2 | Freq: Once | INTRAVENOUS | Status: AC
  Administered 2018-08-26: 96 mg via INTRAVENOUS
  Filled 2018-08-26: qty 16

## 2018-08-26 MED ORDER — SODIUM CHLORIDE 0.9% FLUSH
10.0000 mL | INTRAVENOUS | Status: DC | PRN
  Administered 2018-08-26: 10 mL
  Filled 2018-08-26: qty 10

## 2018-08-26 MED ORDER — HEPARIN SOD (PORK) LOCK FLUSH 100 UNIT/ML IV SOLN
500.0000 [IU] | Freq: Once | INTRAVENOUS | Status: AC | PRN
  Administered 2018-08-26: 500 [IU]
  Filled 2018-08-26: qty 5

## 2018-08-26 MED ORDER — FAMOTIDINE IN NACL 20-0.9 MG/50ML-% IV SOLN
20.0000 mg | Freq: Once | INTRAVENOUS | Status: AC
  Administered 2018-08-26: 20 mg via INTRAVENOUS

## 2018-08-26 MED ORDER — SODIUM CHLORIDE 0.9 % IV SOLN
Freq: Once | INTRAVENOUS | Status: AC
  Administered 2018-08-26: 11:00:00 via INTRAVENOUS
  Filled 2018-08-26: qty 250

## 2018-08-26 MED ORDER — FAMOTIDINE IN NACL 20-0.9 MG/50ML-% IV SOLN
INTRAVENOUS | Status: AC
  Filled 2018-08-26: qty 50

## 2018-08-26 NOTE — Patient Instructions (Signed)
Tunneled Central Venous Catheter Flushing Guide  It is important to flush your tunneled central venous catheter each time you use it, both before and after you use it. Flushing your catheter will help prevent it from clogging. What are the risks? Risks may include:  Infection.  Air getting into the catheter and bloodstream. Supplies needed:  A clean pair of gloves.  A disinfecting wipe. Use an alcohol wipe, chlorhexidine wipe, or iodine wipe as told by your health care provider.  A 10 mL syringe that has been prefilled with saline solution.  An empty 10 mL syringe, if a substance called heparin was injected into your catheter. How to flush your catheter When you flush your catheter, make sure you follow any specific instructions from your health care provider or the manufacturer. These are general guidelines. Flushing your catheter before use If there is heparin in your catheter: 1. Wash your hands with soap and water. 2. Put on gloves. 3. Scrub the injection cap for a minimum of 15 seconds with a disinfecting wipe. 4. Unclamp the catheter. 5. Attach the empty syringe to the injection cap. 6. Pull the syringe plunger back and withdraw 10 mL of blood. 7. Place the syringe into an appropriate waste container. 8. Scrub the injection cap for 15 seconds with a disinfecting wipe. 9. Attach the prefilled syringe to the injection cap. 10. Flush the catheter by pushing the plunger forward until all the liquid from the syringe is in the catheter. 11. Remove the syringe from the injection cap. 12. Clamp the catheter. If there is no heparin in your catheter: 1. Wash your hands with soap and water. 2. Put on gloves. 3. Scrub the injection cap for 15 seconds with a disinfecting wipe. 4. Unclamp the catheter. 5. Attach the prefilled syringe to the injection cap. 6. Flush the catheter by pushing the plunger forward until 5 mL of the liquid from the syringe is in the catheter. 7. Pull back on  the syringe until you see blood in the catheter. 8. If you have been asked to collect any blood, follow your health care provider's instructions. Otherwise, flush the catheter with the rest of the solution from the syringe. 9. Remove the syringe from the injection cap. 10. Clamp the catheter.  Flushing your catheter after use 1. Wash your hands with soap and water. 2. Put on gloves. 3. Scrub the injection cap for 15 seconds with a disinfecting wipe. 4. Unclamp the catheter. 5. Attach the prefilled syringe to the injection cap. 6. Flush the catheter by pushing the plunger forward until all of the liquid from the syringe is in the catheter. 7. Remove the syringe from the injection cap. 8. Clamp the catheter. Problems and solutions  If blood cannot be completely cleared from the injection cap, you may need to have the injection cap replaced.  If the catheter is difficult to flush, use the pulsing method. The pulsing method involves pushing only a few milliliters of solution into the catheter at a time and pausing between pushes.  If you do not see blood in the catheter when you pull back on the syringe, change your body position, such as by raising your arms above your head. Take a deep breath and cough. Then, pull back on the syringe. If you still do not see blood, flush the catheter with a small amount of solution. Then, change positions again and take a breath or cough. Pull back on the syringe again. If you still do not see   blood, finish flushing the catheter and contact your health care provider. Do not use your catheter until your health care provider says it is okay. General tips  Have someone help you flush your catheter, if possible.  Do not force fluid through your catheter.  Do not use a syringe that is larger or smaller than 10 mL. Using a smaller syringe can make the catheter burst.  Do not use your catheter without flushing it first if it has heparin in it. Contact a health  care provider if:  You cannot see any blood in the catheter when you flush it before using it.  Your catheter is difficult to flush. Get help right away if:  You cannot flush the catheter.  The catheter leaks when you flush it or when there is fluid in it.  There are cracks or breaks in the catheter. Summary  It is important to flush your tunneled central venous catheter each time you use it, both before and after you use it.  Scrub the injection cap for 15 seconds with a disinfecting wipe before and after you flush it.  When you flush your catheter, make sure you follow any specific instructions from your health care provider or the manufacturer.  Get help right away if you cannot flush the catheter. This information is not intended to replace advice given to you by your health care provider. Make sure you discuss any questions you have with your health care provider. Document Released: 12/18/2010 Document Revised: 03/16/2018 Document Reviewed: 03/16/2018 Elsevier Patient Education  2020 [employer]  

## 2018-08-26 NOTE — Progress Notes (Signed)
Per Dr. Burr Medico: OK to treat with bilirubin 1.7

## 2018-08-26 NOTE — Patient Instructions (Signed)
[facility] Discharge Instructions for Patients Receiving Chemotherapy  Today you received the following chemotherapy agents: Paclitaxel (Taxol)  To help prevent nausea and vomiting after your treatment, we encourage you to take your nausea medication as directed.    If you develop nausea and vomiting that is not controlled by your nausea medication, call the clinic.   BELOW ARE SYMPTOMS THAT SHOULD BE REPORTED IMMEDIATELY:  *FEVER GREATER THAN 100.5 F  *CHILLS WITH OR WITHOUT FEVER  NAUSEA AND VOMITING THAT IS NOT CONTROLLED WITH YOUR NAUSEA MEDICATION  *UNUSUAL SHORTNESS OF BREATH  *UNUSUAL BRUISING OR BLEEDING  TENDERNESS IN MOUTH AND THROAT WITH OR WITHOUT PRESENCE OF ULCERS  *URINARY PROBLEMS  *BOWEL PROBLEMS  UNUSUAL RASH Items with * indicate a potential emergency and should be followed up as soon as possible.  Feel free to call the clinic should you have any questions or concerns. The clinic phone number is [phone].  Please show the CHEMO ALERT CARD at check-in to the Emergency Department and triage nurse.  Coronavirus (COVID-19) Are you at risk?  Are you at risk for the Coronavirus (COVID-19)?  To be considered HIGH RISK for Coronavirus (COVID-19), you have to meet the following criteria:  . Traveled to [place], [place], [place], [place] or [place]; or in the [place] to [place], [place], [place], or [place]; and have fever, cough, and shortness of breath within the last 2 weeks of travel OR . Been in close contact with a person diagnosed with COVID-19 within the last 2 weeks and have fever, cough, and shortness of breath . IF YOU DO NOT MEET THESE CRITERIA, YOU ARE CONSIDERED LOW RISK FOR COVID-19.  What to do if you are HIGH RISK for COVID-19?  . If you are having a medical emergency, call 911. . Seek medical care right away. Before you go to a doctor's office, urgent care or emergency department, call ahead and tell them about  your recent travel, contact with someone diagnosed with COVID-19, and your symptoms. You should receive instructions from your physician's office regarding next steps of care.  . When you arrive at healthcare provider, tell the healthcare staff immediately you have returned from visiting [place], [place], [place], [place] or [place]; or traveled in the [place] to [place], [place], [place], or [place]; in the last two weeks or you have been in close contact with a person diagnosed with COVID-19 in the last 2 weeks.   . Tell the health care staff about your symptoms: fever, cough and shortness of breath. . After you have been seen by a medical provider, you will be either: o Tested for (COVID-19) and discharged home on quarantine except to seek medical care if symptoms worsen, and asked to  - Stay home and avoid contact with others until you get your results (4-5 days)  - Avoid travel on public transportation if possible (such as bus, train, or airplane) or o Sent to the Emergency Department by EMS for evaluation, COVID-19 testing, and possible admission depending on your condition and test results.  What to do if you are LOW RISK for COVID-19?  Reduce your risk of any infection by using the same precautions used for avoiding the common cold or flu:  . Wash your hands often with soap and warm water for at least 20 seconds.  If soap and water are not readily available, use an alcohol-based hand sanitizer with at least 60% alcohol.  . If coughing   or sneezing, cover your mouth and nose by coughing or sneezing into the elbow areas of your shirt or coat, into a tissue or into your sleeve (not your hands). . Avoid shaking hands with others and consider head nods or verbal greetings only. . Avoid touching your eyes, nose, or mouth with unwashed hands.  . Avoid close contact with people who are sick. . Avoid places or events with large numbers of people in one location, like concerts or sporting  events. . Carefully consider travel plans you have or are making. . If you are planning any travel outside or inside the [place], visit the CDC's Travelers' Health webpage for the latest health notices. . If you have some symptoms but not all symptoms, continue to monitor at home and seek medical attention if your symptoms worsen. . If you are having a medical emergency, call 911.   ADDITIONAL HEALTHCARE OPTIONS FOR PATIENTS  [facility] / e-Visit: [email or web address]         [facility]: [phone]  [facility]: [phone]                   [facility]: [phone]   

## 2018-09-08 NOTE — Progress Notes (Signed)
[name]   [phone] [phone]   Clinic Follow up Note   Patient Care Team: [name], [name], [name] as PCP - General (Physician Assistant) [name], MD as Consulting Physician (Orthopedic Surgery)  Date of Service:  09/09/2018  CHIEF COMPLAINT: F/u ofmetastatic breast cancer  SUMMARY OF ONCOLOGIC HISTORY: Oncology History Overview Note   Cancer Staging No matching staging information was found for the patient.     [name], right ([place])  11/1997 Cancer Diagnosis   Patient had 1.4 cm poorly differentiated right breast carcinoma diagnosed in Nov 1999 at age 53, 1/7 axillary nodes involved, ER/PR and HER 2 positive. She had mastectomy with the 7 axillary node evaluation, 4 cycles of adriamycin/ cytoxan followed by taxotere, then five years of tamoxifen thru June 2005 (no herceptin in 1999). She had bilateral oophorectomy in May 2005. She was briefly on aromatase inhibitor after tamoxifen, but discontinued this herself due to poor tolerance.   04/04/2015 Genetic Testing   Negative genetic testing on the breast/ovarian cancer pnael.  The Breast/Ovarian gene panel offered by [facility] includes sequencing and rearrangement analysis for the following 20 genes:  ATM, BARD1, BRCA1, BRCA2, BRIP1, CDH1, CHEK2, EPCAM, FANCC, MLH1, MSH2, MSH6, NBN, PALB2, PMS2, PTEN, RAD51C, RAD51D, TP53, and XRCC2.   06/26/2015 Pathology Results   Initial Path Report Bone, curettage, Left femur met, intramedullary subtroch tissue - METASTATIC ADENOCARCINOMA.  Estrogen Receptor: 95%, POSITIVE, STRONG STAINING INTENSITY Progesterone Receptor: 2%, POSITIVE, STRONG  HER2 (+), IHC 3+   06/26/2015 Surgery   Biopsy of metastatic tissue and stabilization with Affixus trochanteric nail, proximal and distal interlock of left femur by Dr. [name]    06/28/2015 Imaging   CT Chest w/ Contrast 1. Extensive right internal mammary chain  lymphadenopathy consistent with metastatic breast cancer. 2. Lytic metastases involving the posterior elements at T1 with probable nondisplaced pathologic fracture of the spinous process. 3. No other evidence of thoracic metastatic disease. 4. Trace bilateral pleural effusions with associated bibasilar atelectasis. 5. Indeterminate right thyroid nodule, not previously imaged. This could be evaluated with thyroid ultrasound as clinically warranted.   07/01/2015 Imaging   Bone Scan 1. Increased activity noted throughout the left femur. Although metastatic disease cannot be excluded, these changes are most likely secondary to prior surgery. 2. No other focal abnormalities identified to suggest metastatic disease.   07/12/2015 - 07/26/2015 Radiation Therapy   Left femur, 30 Gy in 10 fractions by Dr. Sondra Come    07/14/2015 Initial Diagnosis   [name], right ([place])   07/19/2015 Imaging   Initial PET Scan 1. Severe multifocal osseous metastatic disease, much greater than anticipated based on prior imaging. 2. Multifocal nodal metastases involving the right internal mammary, prevascular and subpectoral lymph nodes. No axillary pulmonary involvement identified. 3. No distant extra osseous metastases.   07/30/2015 - 11/29/2015 Chemotherapy   Docetaxel, Herceptin and Perjeta every 3 weeks X6 cycles, pt tolerated moderately well, restaging scan showed excellent response    09/18/2015 Imaging   CT Head w/wo Contrast 1. No acute intracranial abnormality or significant interval change. 2. Stable minimal periventricular white matter hypoattenuation on the right. This may reflect a remote ischemic injury. 3. No evidence for metastatic disease to the brain. 4. No focal soft tissue lesion to explain the patient's tenderness.   10/30/2015 Imaging   CT Abdomen Pelvis w/ Contrast 1. Few mildly prominent fluid-filled loops of small bowel scattered throughout the abdomen, with  with  intraluminal fluid density within the distal colon. Given the provided history, findings are suggestive of acute enteritis/diarrheal illness. 2. No other acute intra-abdominal or pelvic process identified. 3. Widespread osseous metastatic disease, better evaluated on most recent PET-CT from 07/19/2015. No pathologic fracture or other complication. 4. 11 mm hypodensity within the left kidney. This lesion measures intermediate density, and is indeterminate. While this lesion is similar in size relative to recent studies, this is increased in size relative to prior study from 2012. Further evaluation with dedicated renal mass protocol CT and/or MRI is recommended for complete characterization.   12/2015 - 05/14/2016 Chemotherapy   Maintenance Herceptin and pejeta every 3 weeks stopped on 05/14/16 due to disease progression   12/25/2015 Imaging   Restaging PET Scan 1. Markedly improved skeletal activity, with only several faint foci of residual accentuated metabolic activity, but resolution of the vast majority of the previously extensive osseous metastatic disease. 2. Resolution of the prior right internal mammary and right prevascular lymph nodes. 3. Residual subcutaneous edema and edema along fascia planes in the left upper thigh. This remains somewhat more than I would expect for placement of an IM nail 6 weeks ago. If there is leg swelling further down, consider Doppler venous ultrasound to rule out left lower extremity DVT. I do not perceive an obvious difference in density between the pelvic and common femoral veins on the noncontrast CT data. 4. Chronic right maxillary and right sphenoid sinusitis.   01/16/2016 - 10/20/2017 Anti-estrogen oral therapy   Letrozole 2.5 mg daily. She was switched to exemestane due to disease progression on 02/08/17. Stopped on 10/20/2017 when treatment changed to chemo    03/01/2016 Miscellaneous   Patient presented to ED following a fall; she reports she landed on her  back and is now experiencing back pain. The patient was evaluated and discharge home same day with pain control.   05/01/2016 Imaging   CT CAP IMPRESSION: 1. Stable exam.  No new or progressive disease identified. 2. No evidence for residual or recurrent adenopathy within the chest. 3. Stable bone metastasis.   05/01/2016 Imaging   BONE SCAN IMPRESSION: 1. Small focal uptake involving the anterior rib ends of the left second and right fourth ribs, new since the prior bone scan. The location of this uptake is more suggestive of a traumatic or inflammatory etiology as opposed to metastatic disease. No other evidence of metastatic disease. 2. There are other areas of stable uptake which are most likely degenerative/reactive, including right shoulder and cervical spine uptake and uptake along the right femur adjacent to the ORIF hardware.   05/20/2016 Imaging   NM PET Skull to Thigh IMPRESSION: 1. There multiple metastatic foci in the bony pelvis, including some new abnormal foci compare to the prior PET-CT, compatible with mildly progressive bony metastatic disease. Also, a left T11 vertebral hypermetabolic metastatic lesion is observed, new compared to the prior exam. 2. No active extraosseous malignancy is currently identified. 3. Right anterior fourth rib healing fracture, appears be   06/04/2016 - 01/08/2017 Chemotherapy   Second line chemotherapy Kadcyla every 3 weeks started on 06/04/16 and stopped on 01/08/17 due to mixed response.     10/05/2016 Imaging   CT CAP and Bone Scan:  Bones: left intramedullary rod and left femoral head neck screw in place. In the proximal diaphysis of the left femur there is cortical thickening and irregularity. No lytic or blastic bone lesions. No fracture or vertebral endplate destruction.   No definite evidence  of recurrent disease in the chest, abdomen, or pelvis.   10/28/2016 Imaging   CT A/P IMPRESSION: No acute process demonstrated in  the abdomen or pelvis. No evidence of bowel obstruction or inflammation.   11/04/2016 Imaging   DG foot complete left: IMPRESSION: No fracture or dislocation.  No soft tissue abnormality   11/23/2016 Imaging   CT RIGHT FEMUR IMPRESSION: Negative CT scan of the right thigh.  No visible metastatic disease.  CT LEFT FEMUR IMPRESSION: 1. Postsurgical changes related to prior cephalomedullary rod fixation of the left femur with linear lucency along the anterolateral proximal femoral cortex at level of the lesser trochanter, suspicious for nondisplaced fracture. 2. Slightly permeative appearance of the proximal left femoral cortex at the site of known prior osseous metastases is largely unchanged. 3. Unchanged patchy lucency and sclerosis along the anterior acetabulum, which appears to correspond with an area of faint uptake on the prior PET-CT, suspicious for osseous metastasis. These results will be called to the ordering clinician or representative by the Radiologist Assistant, and communication documented in the PACS or zVision Dashboard.     01/27/2017 Imaging   IMPRESSION: 1. Mixed response to osseous metastasis. Some hypermetabolic lesions are new and increasingly hypermetabolic, while another index lesion is decreasingly hypermetabolic. 2. No evidence of hypermetabolic soft tissue metastasis.    02/08/2017 - 10/19/2017 Antibody Plan   -Herceptin every 3 week since 02/08/17 -Oral Lanpantinib 1588m and decreased to 5060mdue to diarrhea and depression. Due to disease progression she swithced to Verzenio 15022mn 05/04/17. She did not toelrate well so she was switched to Ibrance 100m58m 05/31/17. Due to her neutropenia, Ibrance dose reduced to 75 mg daily, 3 weeks on, one-week off, stopped on 10/20/2017 due to disease progression.    04/30/2017 PET scan   IMPRESSION: 1. Primarily increase in hypermetabolism of osseous metastasis. An isolated right acetabular lesion is  decreased in hypermetabolism since the prior exam. 2. No evidence of hypermetabolic soft tissue metastasis. 3. Similar non FDG avid right-sided thyroid nodule, favoring a benign etiology. Recommend attention on follow-up.   08/30/2017 PET scan   08/30/2017 PET Scan  IMPRESSION: 1. Worsening osseous metastatic disease. 2. Focal hypermetabolism in the central spinal canal the L1 level, similar to the prior exam. Metastatic implant cannot be excluded. 3. Slight enlargement of a right thyroid nodule which now appears more cystic in character. Consider further evaluation with thyroid ultrasound. If patient is clinically hyperthyroid, consider nuclear medicine thyroid uptake and scan   08/30/2017 Progression   08/30/2017 PET Scan  IMPRESSION: 1. Worsening osseous metastatic disease. 2. Focal hypermetabolism in the central spinal canal the L1 level, similar to the prior exam. Metastatic implant cannot be excluded. 3. Slight enlargement of a right thyroid nodule which now appears more cystic in character. Consider further evaluation with thyroid ultrasound. If patient is clinically hyperthyroid, consider nuclear medicine thyroid uptake and scan   09/29/2017 - 10/11/2017 Radiation Therapy   Pt received 27 Gy in 9 fractions directed at the areas of significant uptake noted on recent bone scan the right pelvis and right proximal femur, with Dr, KinaSondra Come10/09/2017 - 04/07/2018 Chemotherapy   -Herceptin every 3 weeks starting 02/08/17, perjeta added on 10/20/2017  -weekly Taxol started on 10/21/2017. Will reduce Taxol to 2 weeks on, 1 week off starting on 02/04/17.  Stoped due to mild disease progression   10/27/2017 Imaging   10/27/2017 CT AP IMPRESSION: 1. No acute abnormality. No evidence of bowel obstruction or  inflammation. Normal appendix. 2. Stable patchy sclerotic osseous metastases throughout the visualized skeleton. No new or progressive metastatic disease in the abdomen  or pelvis.   01/11/2018 Imaging   Whole Body Bone Scan 01/11/18  IMPRESSION: 1. Multifocal osseous metastases as described. 2. Right scapular metastasis. 3. Right superior thoracic spinous process. 4. Right sacral ala and right L5 metastases. 5. Right acetabular metastasis. 6. Right proximal femur metastasis. 7. Diffuse metastases throughout the left femur. 8. Anterior right fourth rib metastasis.   01/11/2018 Imaging   CT CAP W Contrast 01/11/18  IMPRESSION: 1. Similar-appearing osseous metastatic disease. 2. No evidence for progressed metastatic disease in the chest, abdomen or pelvis.   03/30/2018 Imaging   Bone Scan 03/30/18  IMPRESSION: Stable multifocal bony metastatic disease.   03/30/2018 Imaging    CT CAP W Contrast 03/30/18 IMPRESSION: 1. New mild contiguous wall thickening with associated mucosal hyperenhancement throughout the left colon and rectum, suggesting nonspecific infectious or inflammatory proctocolitis. Differential includes C diff colitis. 2. Stable faint patchy sclerosis in the iliac bones and lumbar vertebral bodies. No new or progressive osseous metastatic disease by CT. Please note that the osseous lesions are relatively occult by CT and would be better evaluated by PET-CT, as clinically warranted. 3. No evidence of extra osseous metastatic disease.  These results will be called to the ordering clinician or representative by the Radiologist Assistant, and communication documented in the PACS or zVision Dashboard.   04/15/2018 - 06/16/2018 Chemotherapy   Enhertu q3weeks starting 04/15/18. Stopped Enhertu after cycle 3 on 05/27/18 due to poor toleration.    04/21/2018 PET scan   PET 04/21/18  IMPRESSION: 1. Overall marked interval improvement in bony hypermetabolic disease. All of the previously identified hypermetabolic osseous metastases have decreased in hypermetabolism in the interval. There is a new tiny focus of hypermetabolism in the right aspect  of the L3 vertebral body that appears to correspond to a new 8 mm lucency, raising concern for new metastatic disease. Close attention on follow-up recommended. 2. Focal hypermetabolism in the central spinal canal at the level of L1 previously has resolved. 3. No hypermetabolic soft tissue disease on today's study. 4. Right thyroid nodule seen previously has decreased in the interval.    06/16/2018 Imaging   CT LUMBAR SPINE WO CONTRAST 06/16/18 IMPRESSION: 1. Transitional lumbosacral anatomy. Correlation with radiographs is recommended prior to any operative intervention. 2. Suspicion of a right L3 vertebral body metastasis on the April PET-CT is not evident by CT. Stable degenerative versus metastatic lesion at the right L5 superior endplate. Small but increased right iliac bone metastasis since March. Lumbar MRI would best characterize lumbar metastatic disease (without contrast if necessary). 3. Suspect L2-L3 left subarticular segment disc herniation. Query left L2 radiculitis. 4. Stable appearance of rightward L4-L5 disc degeneration which could affect the right foramen and right lateral recess.   06/17/2018 -  Chemotherapy   Restart Taxol 2 weeks on/1 week off with Herceptin and Perjeta q3weeks on 06/17/18   07/06/2018 PET scan   PET  IMPRESSION: 1. Near total resolution of hypermetabolic activity associated with skeletal metastasis. Very mild activity associated with the LEFT iliac bone which is similar to background blood pool activity. 2. No new or progressive disease.      CURRENT THERAPY:  Restart Taxol 2 weeks on/1 week off with Herceptin and Perjeta q3weeks on 06/17/18. Dose reduce Perjeta with cycle 5 due to diarrhea.Perjeta stopped after 07/29/18 due to persistent diarrhea.    INTERVAL HISTORY:  Debra  C [name] is here for a follow up and ongoing treatment. She presents to the clinic with her interpretor. She notes she is doing well. She notes her taste changed for  1-2 days then subsides. She is abel to still eat adequately. She notes pain in her left leg is stable. She notes no chest or back pain anymore. She takes oxycodone 1-2 times a day. She notes her neuropathy is mostly stable. She declined ice bags during infusion. She denies any more diarrhea since stopping Perjeta. She continues to take oral potassium 2 tabs a day    REVIEW OF SYSTEMS:   Constitutional: Denies fevers, chills or abnormal weight loss Eyes: Denies blurriness of vision Ears, nose, mouth, throat, and face: Denies mucositis or sore throat Respiratory: Denies cough, dyspnea or wheezes Cardiovascular: Denies palpitation, chest discomfort or lower extremity swelling Gastrointestinal:  Denies nausea, heartburn or change in bowel habits Skin: Denies abnormal skin rashes MSK: (+) Left upper leg pain stable  Lymphatics: Denies new lymphadenopathy or easy bruising Neurological:Denies numbness, tingling or new weaknesses Behavioral/Psych: Mood is stable, no new changes  All other systems were reviewed with the patient and are negative.  MEDICAL HISTORY:  Past Medical History:  Diagnosis Date   Abnormal Pap smear    Anxiety    Breast cancer ([place])    Cancer ([place]) 1999   breast-s/p mastectomy, chemo, rad   CIN I (cervical intraepithelial neoplasia I) 2003   by colpo   Congenital deafness    Deaf    Depression    Port-A-Cath in place 2017   for chemotherapy   Radiation 07/12/15-07/26/15   left femur 30 Gy   S/P bilateral oophorectomy     SURGICAL HISTORY: Past Surgical History:  Procedure Laterality Date   ABDOMINAL HYSTERECTOMY     INTRAMEDULLARY (IM) NAIL INTERTROCHANTERIC Left 06/26/2015   Procedure: LEFT BIOMET LONG AFFIXS NAIL;  Surgeon: [name], MD;  Location: [facility];  Service: Orthopedics;  Laterality: Left;   IR GENERIC HISTORICAL  08/06/2015   IR US GUIDE VASC ACCESS LEFT 08/06/2015 Aletta Edouard, MD [facility]   IR GENERIC HISTORICAL  08/06/2015     IR FLUORO GUIDE CV LINE LEFT 08/06/2015 Aletta Edouard, MD [facility]   IR GENERIC HISTORICAL  03/05/2016   IR CV LINE INJECTION 03/05/2016 Markus Daft, MD [facility]   LEFT HEART CATH AND CORONARY ANGIOGRAPHY N/A 12/13/2017   Procedure: LEFT HEART CATH AND CORONARY ANGIOGRAPHY;  Surgeon: Leonie Man, MD;  Location: [facility];  Service: Cardiovascular;  Laterality: N/A;   MASTECTOMY     right breast   MASTECTOMY     OVARIAN CYST REMOVAL     RADIOLOGY WITH ANESTHESIA Left 06/25/2015   Procedure: MRI OF LEFT HIP WITH OR WITHOUT CONTRAST;  Surgeon: Medication Radiologist, MD;  Location: [facility];  Service: Radiology;  Laterality: Left;  DR. MCINTYRE/MRI   TUBAL LIGATION      I have reviewed the social history and family history with the patient and they are unchanged from previous note.  ALLERGIES:  is allergic to chlorhexidine; promethazine hcl; and diphenhydramine hcl.  MEDICATIONS:  Current Outpatient Medications  Medication Sig Dispense Refill   albuterol (PROVENTIL HFA;VENTOLIN HFA) 108 (90 Base) MCG/ACT inhaler Inhale 2 puffs into the lungs every 6 (six) hours as needed for wheezing or shortness of breath. 1 Inhaler 0   ALPRAZolam (XANAX) 0.25 MG tablet Take 1 tablet (0.25 mg total) by mouth 2 (two) times daily as needed  for anxiety. 30 tablet 0   dexamethasone (DECADRON) 4 MG tablet Take 1 tablet (4 mg total) by mouth daily. 20 tablet 0   diphenoxylate-atropine (LOMOTIL) 2.5-0.025 MG tablet Take 1-2 tablets by mouth 4 (four) times daily as needed for diarrhea or loose stools. 30 tablet 1   lidocaine-prilocaine (EMLA) cream Apply 1 application topically as needed. Apply to Porta-Cath 1-2 hours prior to access as directed. 90 g 2   methocarbamol (ROBAXIN) 500 MG tablet Take 1 tablet (500 mg total) by mouth 2 (two) times daily. 20 tablet 0   omeprazole (PRILOSEC) 20 MG capsule Take 1 capsule (20 mg total) by mouth daily. 30 capsule 0   ondansetron (ZOFRAN) 8  MG tablet Take 1 tablet (8 mg total) by mouth every 8 (eight) hours as needed for nausea or vomiting. 45 tablet 1   oxyCODONE (ROXICODONE) 5 MG immediate release tablet Take 1 tablet (5 mg total) by mouth every 8 (eight) hours as needed for severe pain. 60 tablet 0   potassium chloride (K-DUR) 10 MEQ tablet Take 2 tablets (20 mEq total) by mouth 2 (two) times daily. 180 tablet 3   prochlorperazine (COMPAZINE) 10 MG tablet Take 1 tablet (10 mg total) by mouth every 6 (six) hours as needed for nausea or vomiting. 60 tablet 2   zolpidem (AMBIEN) 5 MG tablet Take 1.5 tablets (7.5 mg total) by mouth at bedtime as needed for sleep. 45 tablet 0   No current facility-administered medications for this visit.    Facility-Administered Medications Ordered in Other Visits  Medication Dose Route Frequency Provider Last Rate Last Dose   potassium chloride SA (K-DUR) CR tablet 40 mEq  40 mEq Oral Once Truitt Merle, MD       sodium chloride 0.9 % 1,000 mL with potassium chloride 10 mEq infusion   Intravenous Continuous Livesay, Tamala Julian, MD   Stopped at 09/10/15 1653   sodium chloride 0.9 % injection 10 mL  10 mL Intravenous PRN Livesay, Lennis P, MD       sodium chloride flush (NS) 0.9 % injection 10 mL  10 mL Intracatheter PRN Truitt Merle, MD   10 mL at 09/09/18 1306    PHYSICAL EXAMINATION: ECOG PERFORMANCE STATUS: 2 - Symptomatic, <50% confined to bed  Vitals:   09/09/18 0931  BP: 111/80  Pulse: 69  Resp: 18  Temp: 98 F (36.7 C)  SpO2: 100%   Filed Weights   09/09/18 0931  Weight: 132 lb 11.2 oz (60.2 kg)    GENERAL:alert, no distress and comfortable SKIN: skin color, texture, turgor are normal, no rashes or significant lesions EYES: normal, Conjunctiva are pink and non-injected, sclera clear  NECK: supple, thyroid normal size, non-tender, without nodularity LYMPH:  no palpable lymphadenopathy in the cervical, axillary  LUNGS: clear to auscultation and percussion with normal breathing  effort HEART: regular rate & rhythm and no murmurs and no lower extremity edema ABDOMEN:abdomen soft, non-tender and normal bowel sounds Musculoskeletal:no cyanosis of digits and no clubbing  NEURO: alert & oriented x 3 with fluent speech, no focal motor/sensory deficits  LABORATORY DATA:  I have reviewed the data as listed CBC Latest Ref Rng & Units 09/09/2018 08/26/2018 08/19/2018  WBC 4.0 - 10.5 K/uL 4.1 3.8(L) 3.3(L)  Hemoglobin 12.0 - 15.0 g/dL 10.6(L) 10.5(L) 10.3(L)  Hematocrit 36.0 - 46.0 % 30.9(L) 30.8(L) 30.4(L)  Platelets 150 - 400 K/uL 197 193 174     CMP Latest Ref Rng & Units 09/09/2018 08/26/2018 08/19/2018  Glucose  70 - 99 mg/dL 87 91 72  BUN 6 - 20 mg/dL '6 8 6  '$ Creatinine 0.44 - 1.00 mg/dL 0.80 0.80 0.79  Sodium 135 - 145 mmol/L 143 139 142  Potassium 3.5 - 5.1 mmol/L 3.4(L) 3.5 3.0(LL)  Chloride 98 - 111 mmol/L 107 106 108  CO2 22 - 32 mmol/L 26 23 19(L)  Calcium 8.9 - 10.3 mg/dL 8.7(L) 9.0 8.7(L)  Total Protein 6.5 - 8.1 g/dL 6.2(L) 6.2(L) 6.0(L)  Total Bilirubin 0.3 - 1.2 mg/dL 1.5(H) 1.7(H) 1.6(H)  Alkaline Phos 38 - 126 U/L 62 61 61  AST 15 - 41 U/L 13(L) 13(L) 13(L)  ALT 0 - 44 U/L '12 15 13      '$ RADIOGRAPHIC STUDIES: I have personally reviewed the radiological images as listed and agreed with the findings in the report. No results found.   ASSESSMENT & PLAN:  ABRIE EGLOFF is a 53 y.o. 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 [name] recently [employer], Herceptin and Perjetaq3weeks,tolerated moderately well, had overall excellent response based on PET in 04/2018 -Shecompleted 3 cycles of Enhertu and stopped due to poor toleration with anorexia, weight loss, N&V, diarrhea -I restarted her onTaxol 2 weeks on/1 week  off with Herceptin and Perjeta q3weekson 06/17/18, she tolerated first cycle well then developed diarrhea secondary to Perjeta, will dose reduce. I d/c benadryl given allergy.Due to persistent diarrhea, Perjeta was stopped after C5 on 07/29/18.  -She is clinically doing well. No more diarrhea since we stopped Perjeta. Her only pain is in upper left leg, stable. Labs reviewed, CBC and CMP WNL except Hg 10.6, Potassium 3.4, Ca 8.7, Protein 6.2, albumin 3.4, AST 13, Tbili 1.5. Overall adequate to proceed with Taxol/Herceptin today -she will return next week for taxol -F/u in 3 weeks  -if she is clinically stable, plan to repeat scan in late Sep or Oct    2.Body pain, secondary to bone mets -since her cancer diagnosis,she has had b/l hip, shoulder and chest painat different time points, likely related to her bone mets -Ipreviouslyprescribed herGabapentin, butshedecided not to take due to potential side effect of suicidal thoughts. 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 -Will monitor for need of palliative radiation. -Her back and chest pain now resolved. Her main pain is in upper left leg. She only takes oxycodone 1-2 times a day now. I refilled today (09/09/18) -continue zometa every 3 months   3.Congenital deafness  4.Mild anemia, secondary to chemotherapy and malignancy -She will start with oral iron daily for 1-2 months and then stop. She will also start daily women's vitamin. -Hgat10.6 (09/09/18)   5.Hypokalemia, Diarrhea -He has intermittent hyperkalemia, on oral supplementKCL. -Ipreviouslyinstructed her to reduce dairy products in diet. -She developed diarrhea s/p cycle 3 Perjeta. Will dose reduce. -Kat3.4(09/09/18).will continue Potassium45mq daily   6.Goal of care discussion -she understand her goal of care is palliative to prolong her life -she is full code for  now  7.Insomnia -She cannot take Benadryl due to allergy reaction. She has tried trazodone with no relief.  -I advised her to avoid taking Ambien '10mg'$ , oxycodone and Xanax all at the same time as this is too much sedation. Oxycodone can be taken with Ambien or with Xanax. She understands. -  Stable.   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 -Her neuropathy is currently mild and stable in her feet and LE, will monitor on treatment. Stable  -she declined using ice bags using infusion    PLAN: -I refilled oxycodone today  -Lab reviewed, overall adequate to proceed with Taxol, Herceptintoday -she will return next week for Taxol -Lab, flush,f/u andtaxol/Herceptinin3 weeks, will order restaging scan on next visit    No problem-specific Assessment & Plan notes found for this encounter.   No orders of the defined types were placed in this encounter.  All questions were answered. The patient knows to call the clinic with any problems, questions or concerns. No barriers to learning was detected. I spent 20 minutes counseling the patient face to face. The total time spent in the appointment was 25 minutes and more than 50% was on counseling and review of test results     Truitt Merle, MD 09/09/2018   I, Joslyn Devon, am acting as scribe for Truitt Merle, MD.   I have reviewed the above documentation for accuracy and completeness, and I agree with the above.

## 2018-09-09 ENCOUNTER — Inpatient Hospital Stay: Payer: Medicaid Other

## 2018-09-09 ENCOUNTER — Other Ambulatory Visit: Payer: Self-pay

## 2018-09-09 ENCOUNTER — Inpatient Hospital Stay (HOSPITAL_BASED_OUTPATIENT_CLINIC_OR_DEPARTMENT_OTHER): Payer: Medicaid Other | Admitting: Hematology

## 2018-09-09 ENCOUNTER — Encounter: Payer: Self-pay | Admitting: Hematology

## 2018-09-09 ENCOUNTER — Telehealth: Payer: Self-pay | Admitting: Hematology

## 2018-09-09 VITALS — BP 111/80 | HR 69 | Temp 98.0°F | Resp 18 | Ht 60.5 in | Wt 132.7 lb

## 2018-09-09 DIAGNOSIS — C7951 Secondary malignant neoplasm of bone: Secondary | ICD-10-CM

## 2018-09-09 DIAGNOSIS — C50911 Malignant neoplasm of unspecified site of right female breast: Secondary | ICD-10-CM | POA: Diagnosis not present

## 2018-09-09 DIAGNOSIS — Z5112 Encounter for antineoplastic immunotherapy: Secondary | ICD-10-CM | POA: Diagnosis not present

## 2018-09-09 DIAGNOSIS — T451X5A Adverse effect of antineoplastic and immunosuppressive drugs, initial encounter: Secondary | ICD-10-CM

## 2018-09-09 DIAGNOSIS — D6481 Anemia due to antineoplastic chemotherapy: Secondary | ICD-10-CM

## 2018-09-09 DIAGNOSIS — Z95828 Presence of other vascular implants and grafts: Secondary | ICD-10-CM

## 2018-09-09 LAB — CMP (CANCER CENTER ONLY)
ALT: 12 U/L (ref 0–44)
AST: 13 U/L — ABNORMAL LOW (ref 15–41)
Albumin: 3.4 g/dL — ABNORMAL LOW (ref 3.5–5.0)
Alkaline Phosphatase: 62 U/L (ref 38–126)
Anion gap: 10 (ref 5–15)
BUN: 6 mg/dL (ref 6–20)
CO2: 26 mmol/L (ref 22–32)
Calcium: 8.7 mg/dL — ABNORMAL LOW (ref 8.9–10.3)
Chloride: 107 mmol/L (ref 98–111)
Creatinine: 0.8 mg/dL (ref 0.44–1.00)
GFR, Est AFR Am: >60 mL/min (ref >60)
GFR, Estimated: >60 mL/min (ref >60)
Glucose, Bld: 87 mg/dL (ref 70–99)
Potassium: 3.4 mmol/L — ABNORMAL LOW (ref 3.5–5.1)
Sodium: 143 mmol/L (ref 135–145)
Total Bilirubin: 1.5 mg/dL — ABNORMAL HIGH (ref 0.3–1.2)
Total Protein: 6.2 g/dL — ABNORMAL LOW (ref 6.5–8.1)

## 2018-09-09 LAB — CBC WITH DIFFERENTIAL (CANCER CENTER ONLY)
Abs Immature Granulocytes: 0.03 10*3/uL (ref 0.00–0.07)
Basophils Absolute: 0 10*3/uL (ref 0.0–0.1)
Basophils Relative: 1 %
Eosinophils Absolute: 0 10*3/uL (ref 0.0–0.5)
Eosinophils Relative: 1 %
HCT: 30.9 % — ABNORMAL LOW (ref 36.0–46.0)
Hemoglobin: 10.6 g/dL — ABNORMAL LOW (ref 12.0–15.0)
Immature Granulocytes: 1 %
Lymphocytes Relative: 19 %
Lymphs Abs: 0.8 10*3/uL (ref 0.7–4.0)
MCH: 30.2 pg (ref 26.0–34.0)
MCHC: 34.3 g/dL (ref 30.0–36.0)
MCV: 88 fL (ref 80.0–100.0)
Monocytes Absolute: 0.4 10*3/uL (ref 0.1–1.0)
Monocytes Relative: 9 %
Neutro Abs: 2.9 10*3/uL (ref 1.7–7.7)
Neutrophils Relative %: 69 %
Platelet Count: 197 10*3/uL (ref 150–400)
RBC: 3.51 MIL/uL — ABNORMAL LOW (ref 3.87–5.11)
RDW: 13.5 % (ref 11.5–15.5)
WBC Count: 4.1 10*3/uL (ref 4.0–10.5)
nRBC: 0 % (ref 0.0–0.2)

## 2018-09-09 MED ORDER — LORATADINE 10 MG PO TABS
ORAL_TABLET | ORAL | Status: AC
  Filled 2018-09-09: qty 1

## 2018-09-09 MED ORDER — SODIUM CHLORIDE 0.9% FLUSH
10.0000 mL | INTRAVENOUS | Status: DC | PRN
  Administered 2018-09-09: 10 mL via INTRAVENOUS
  Filled 2018-09-09: qty 10

## 2018-09-09 MED ORDER — SODIUM CHLORIDE 0.9 % IJ SOLN
10.0000 mL | INTRAMUSCULAR | Status: DC | PRN
  Administered 2018-09-09: 10 mL via INTRAVENOUS
  Filled 2018-09-09: qty 10

## 2018-09-09 MED ORDER — HEPARIN SOD (PORK) LOCK FLUSH 100 UNIT/ML IV SOLN
500.0000 [IU] | Freq: Once | INTRAVENOUS | Status: AC | PRN
  Administered 2018-09-09: 500 [IU]
  Filled 2018-09-09: qty 5

## 2018-09-09 MED ORDER — LORATADINE 10 MG PO TABS
10.0000 mg | ORAL_TABLET | Freq: Once | ORAL | Status: AC
  Administered 2018-09-09: 10:00:00 10 mg via ORAL

## 2018-09-09 MED ORDER — DEXAMETHASONE SODIUM PHOSPHATE 10 MG/ML IJ SOLN
INTRAMUSCULAR | Status: AC
  Filled 2018-09-09: qty 1

## 2018-09-09 MED ORDER — OXYCODONE HCL 5 MG PO TABS: 5.0000 mg | ORAL_TABLET | Freq: Three times a day (TID) | ORAL | 0 refills | Status: DC | PRN

## 2018-09-09 MED ORDER — SODIUM CHLORIDE 0.9% FLUSH
10.0000 mL | INTRAVENOUS | Status: DC | PRN
  Administered 2018-09-09: 10 mL
  Filled 2018-09-09: qty 10

## 2018-09-09 MED ORDER — SODIUM CHLORIDE 0.9 % IV SOLN
60.0000 mg/m2 | Freq: Once | INTRAVENOUS | Status: AC
  Administered 2018-09-09: 96 mg via INTRAVENOUS
  Filled 2018-09-09: qty 16

## 2018-09-09 MED ORDER — DEXAMETHASONE SODIUM PHOSPHATE 10 MG/ML IJ SOLN
10.0000 mg | Freq: Once | INTRAMUSCULAR | Status: AC
  Administered 2018-09-09: 10 mg via INTRAVENOUS

## 2018-09-09 MED ORDER — ALTEPLASE 2 MG IJ SOLR
INTRAMUSCULAR | Status: AC
  Filled 2018-09-09: qty 2

## 2018-09-09 MED ORDER — SODIUM CHLORIDE 0.9 % IV SOLN
Freq: Once | INTRAVENOUS | Status: AC
  Administered 2018-09-09: 10:00:00 via INTRAVENOUS
  Filled 2018-09-09: qty 250

## 2018-09-09 MED ORDER — TRASTUZUMAB-DKST CHEMO 150 MG IV SOLR
6.0000 mg/kg | Freq: Once | INTRAVENOUS | Status: AC
  Administered 2018-09-09: 11:00:00 357 mg via INTRAVENOUS
  Filled 2018-09-09: qty 17

## 2018-09-09 MED ORDER — ACETAMINOPHEN 325 MG PO TABS
650.0000 mg | ORAL_TABLET | Freq: Once | ORAL | Status: AC
  Administered 2018-09-09: 650 mg via ORAL

## 2018-09-09 MED ORDER — FAMOTIDINE IN NACL 20-0.9 MG/50ML-% IV SOLN
20.0000 mg | Freq: Once | INTRAVENOUS | Status: AC
  Administered 2018-09-09: 10:00:00 20 mg via INTRAVENOUS

## 2018-09-09 MED ORDER — ALTEPLASE 2 MG IJ SOLR
2.0000 mg | Freq: Once | INTRAMUSCULAR | Status: AC | PRN
  Administered 2018-09-09: 09:00:00 2 mg
  Filled 2018-09-09: qty 2

## 2018-09-09 MED ORDER — FAMOTIDINE IN NACL 20-0.9 MG/50ML-% IV SOLN
INTRAVENOUS | Status: AC
  Filled 2018-09-09: qty 50

## 2018-09-09 MED ORDER — ACETAMINOPHEN 325 MG PO TABS
ORAL_TABLET | ORAL | Status: AC
  Filled 2018-09-09: qty 2

## 2018-09-09 NOTE — Patient Instructions (Signed)
[facility] Discharge Instructions for Patients Receiving Chemotherapy  Today you received the following chemotherapy agents Trastuzumab and Taxol  To help prevent nausea and vomiting after your treatment, we encourage you to take your nausea medication as directed.    If you develop nausea and vomiting that is not controlled by your nausea medication, call the clinic.   BELOW ARE SYMPTOMS THAT SHOULD BE REPORTED IMMEDIATELY:  *FEVER GREATER THAN 100.5 F  *CHILLS WITH OR WITHOUT FEVER  NAUSEA AND VOMITING THAT IS NOT CONTROLLED WITH YOUR NAUSEA MEDICATION  *UNUSUAL SHORTNESS OF BREATH  *UNUSUAL BRUISING OR BLEEDING  TENDERNESS IN MOUTH AND THROAT WITH OR WITHOUT PRESENCE OF ULCERS  *URINARY PROBLEMS  *BOWEL PROBLEMS  UNUSUAL RASH Items with * indicate a potential emergency and should be followed up as soon as possible.  Feel free to call the clinic should you have any questions or concerns. The clinic phone number is [phone].  Please show the CHEMO ALERT CARD at check-in to the Emergency Department and triage nurse.   

## 2018-09-09 NOTE — Patient Instructions (Signed)

## 2018-09-09 NOTE — Telephone Encounter (Signed)
Scheduled appt per 8/28 los.  Printed calendar and avs and took it to the patient while she was in treatment.

## 2018-09-09 NOTE — Progress Notes (Signed)
CATHFLO given at [place] by Kaleen Odea blood drawn peripherally by Leontine Locket, LPN

## 2018-09-16 ENCOUNTER — Inpatient Hospital Stay: Payer: Medicaid Other

## 2018-09-16 ENCOUNTER — Other Ambulatory Visit: Payer: Self-pay

## 2018-09-16 ENCOUNTER — Inpatient Hospital Stay: Payer: Medicaid Other | Attending: Hematology

## 2018-09-16 ENCOUNTER — Other Ambulatory Visit: Payer: Self-pay | Admitting: Hematology

## 2018-09-16 VITALS — BP 115/70 | HR 76 | Temp 98.0°F | Resp 17

## 2018-09-16 DIAGNOSIS — Z95828 Presence of other vascular implants and grafts: Secondary | ICD-10-CM

## 2018-09-16 DIAGNOSIS — Z170 Estrogen receptor positive status [ER+]: Secondary | ICD-10-CM | POA: Diagnosis not present

## 2018-09-16 DIAGNOSIS — E876 Hypokalemia: Secondary | ICD-10-CM | POA: Insufficient documentation

## 2018-09-16 DIAGNOSIS — H905 Unspecified sensorineural hearing loss: Secondary | ICD-10-CM | POA: Insufficient documentation

## 2018-09-16 DIAGNOSIS — C50911 Malignant neoplasm of unspecified site of right female breast: Secondary | ICD-10-CM | POA: Diagnosis present

## 2018-09-16 DIAGNOSIS — C7951 Secondary malignant neoplasm of bone: Secondary | ICD-10-CM

## 2018-09-16 DIAGNOSIS — G620 Drug-induced polyneuropathy: Secondary | ICD-10-CM | POA: Diagnosis not present

## 2018-09-16 DIAGNOSIS — Z5111 Encounter for antineoplastic chemotherapy: Secondary | ICD-10-CM | POA: Diagnosis present

## 2018-09-16 DIAGNOSIS — Z5112 Encounter for antineoplastic immunotherapy: Secondary | ICD-10-CM | POA: Diagnosis present

## 2018-09-16 DIAGNOSIS — C50919 Malignant neoplasm of unspecified site of unspecified female breast: Secondary | ICD-10-CM

## 2018-09-16 DIAGNOSIS — Z23 Encounter for immunization: Secondary | ICD-10-CM | POA: Diagnosis not present

## 2018-09-16 DIAGNOSIS — D6481 Anemia due to antineoplastic chemotherapy: Secondary | ICD-10-CM | POA: Diagnosis not present

## 2018-09-16 DIAGNOSIS — G4700 Insomnia, unspecified: Secondary | ICD-10-CM | POA: Insufficient documentation

## 2018-09-16 LAB — CBC WITH DIFFERENTIAL (CANCER CENTER ONLY)
Abs Immature Granulocytes: 0.11 10*3/uL — ABNORMAL HIGH (ref 0.00–0.07)
Basophils Absolute: 0 10*3/uL (ref 0.0–0.1)
Basophils Relative: 0 %
Eosinophils Absolute: 0.1 10*3/uL (ref 0.0–0.5)
Eosinophils Relative: 2 %
HCT: 29.8 % — ABNORMAL LOW (ref 36.0–46.0)
Hemoglobin: 10.2 g/dL — ABNORMAL LOW (ref 12.0–15.0)
Immature Granulocytes: 3 %
Lymphocytes Relative: 25 %
Lymphs Abs: 1.1 10*3/uL (ref 0.7–4.0)
MCH: 30 pg (ref 26.0–34.0)
MCHC: 34.2 g/dL (ref 30.0–36.0)
MCV: 87.6 fL (ref 80.0–100.0)
Monocytes Absolute: 0.3 10*3/uL (ref 0.1–1.0)
Monocytes Relative: 7 %
Neutro Abs: 2.8 10*3/uL (ref 1.7–7.7)
Neutrophils Relative %: 63 %
Platelet Count: 196 10*3/uL (ref 150–400)
RBC: 3.4 MIL/uL — ABNORMAL LOW (ref 3.87–5.11)
RDW: 13.5 % (ref 11.5–15.5)
WBC Count: 4.5 10*3/uL (ref 4.0–10.5)
nRBC: 0 % (ref 0.0–0.2)

## 2018-09-16 LAB — CMP (CANCER CENTER ONLY)
ALT: 13 U/L (ref 0–44)
AST: 13 U/L — ABNORMAL LOW (ref 15–41)
Albumin: 3.5 g/dL (ref 3.5–5.0)
Alkaline Phosphatase: 61 U/L (ref 38–126)
Anion gap: 13 (ref 5–15)
BUN: 8 mg/dL (ref 6–20)
CO2: 23 mmol/L (ref 22–32)
Calcium: 8.6 mg/dL — ABNORMAL LOW (ref 8.9–10.3)
Chloride: 105 mmol/L (ref 98–111)
Creatinine: 0.79 mg/dL (ref 0.44–1.00)
GFR, Est AFR Am: >60 mL/min (ref >60)
GFR, Estimated: >60 mL/min (ref >60)
Glucose, Bld: 89 mg/dL (ref 70–99)
Potassium: 3 mmol/L — CL (ref 3.5–5.1)
Sodium: 141 mmol/L (ref 135–145)
Total Bilirubin: 1.5 mg/dL — ABNORMAL HIGH (ref 0.3–1.2)
Total Protein: 6.1 g/dL — ABNORMAL LOW (ref 6.5–8.1)

## 2018-09-16 MED ORDER — SODIUM CHLORIDE 0.9% FLUSH
10.0000 mL | Freq: Once | INTRAVENOUS | Status: AC
  Administered 2018-09-16: 10 mL
  Filled 2018-09-16: qty 10

## 2018-09-16 MED ORDER — SODIUM CHLORIDE 0.9% FLUSH
10.0000 mL | INTRAVENOUS | Status: DC | PRN
  Administered 2018-09-16: 10 mL
  Filled 2018-09-16: qty 10

## 2018-09-16 MED ORDER — SODIUM CHLORIDE 0.9 % IV SOLN
Freq: Once | INTRAVENOUS | Status: AC
  Administered 2018-09-16: 09:00:00 via INTRAVENOUS
  Filled 2018-09-16: qty 250

## 2018-09-16 MED ORDER — DEXAMETHASONE SODIUM PHOSPHATE 10 MG/ML IJ SOLN
INTRAMUSCULAR | Status: AC
  Filled 2018-09-16: qty 1

## 2018-09-16 MED ORDER — DEXAMETHASONE SODIUM PHOSPHATE 10 MG/ML IJ SOLN
10.0000 mg | Freq: Once | INTRAMUSCULAR | Status: AC
  Administered 2018-09-16: 10 mg via INTRAVENOUS

## 2018-09-16 MED ORDER — HEPARIN SOD (PORK) LOCK FLUSH 100 UNIT/ML IV SOLN
500.0000 [IU] | Freq: Once | INTRAVENOUS | Status: AC | PRN
  Administered 2018-09-16: 500 [IU]
  Filled 2018-09-16: qty 5

## 2018-09-16 MED ORDER — FAMOTIDINE IN NACL 20-0.9 MG/50ML-% IV SOLN
20.0000 mg | Freq: Once | INTRAVENOUS | Status: AC
  Administered 2018-09-16: 20 mg via INTRAVENOUS

## 2018-09-16 MED ORDER — POTASSIUM CHLORIDE CRYS ER 20 MEQ PO TBCR
20.0000 meq | EXTENDED_RELEASE_TABLET | Freq: Once | ORAL | Status: AC
  Administered 2018-09-16: 20 meq via ORAL

## 2018-09-16 MED ORDER — FAMOTIDINE IN NACL 20-0.9 MG/50ML-% IV SOLN
INTRAVENOUS | Status: AC
  Filled 2018-09-16: qty 50

## 2018-09-16 MED ORDER — SODIUM CHLORIDE 0.9 % IV SOLN
60.0000 mg/m2 | Freq: Once | INTRAVENOUS | Status: AC
  Administered 2018-09-16: 96 mg via INTRAVENOUS
  Filled 2018-09-16: qty 16

## 2018-09-16 NOTE — Patient Instructions (Signed)
[facility] Discharge Instructions for Patients Receiving Chemotherapy  Today you received the following chemotherapy agents:  Taxol.  To help prevent nausea and vomiting after your treatment, we encourage you to take your nausea medication as directed.   If you develop nausea and vomiting that is not controlled by your nausea medication, call the clinic.   BELOW ARE SYMPTOMS THAT SHOULD BE REPORTED IMMEDIATELY:  *FEVER GREATER THAN 100.5 F  *CHILLS WITH OR WITHOUT FEVER  NAUSEA AND VOMITING THAT IS NOT CONTROLLED WITH YOUR NAUSEA MEDICATION  *UNUSUAL SHORTNESS OF BREATH  *UNUSUAL BRUISING OR BLEEDING  TENDERNESS IN MOUTH AND THROAT WITH OR WITHOUT PRESENCE OF ULCERS  *URINARY PROBLEMS  *BOWEL PROBLEMS  UNUSUAL RASH Items with * indicate a potential emergency and should be followed up as soon as possible.  Feel free to call the clinic should you have any questions or concerns. The clinic phone number is [phone].  Please show the CHEMO ALERT CARD at check-in to the Emergency Department and triage nurse.   

## 2018-09-16 NOTE — Progress Notes (Signed)
CRITICAL VALUE STICKER  CRITICAL VALUE:  Potassium 3.0  RECEIVER (on-site recipient of call): Valda Favia RN  [place] NOTIFIED: 09/16/2018  0915  MESSENGER (representative from lab): Carver Fila   MD NOTIFIED: Dr. Burr Medico    TIME OF NOTIFICATION:  09/16/2018  0920  RESPONSE: received order for patient to received Potassium 20 meq tab here, then instructed to take her Potassium 20 meq tabs 3 to 4 times daily at home Lexine Baton her infusion nurse will instruct her)

## 2018-09-17 LAB — CANCER ANTIGEN 27.29: CA 27.29: 37.5 U/mL (ref 0.0–38.6)

## 2018-09-26 NOTE — Progress Notes (Signed)
[name]   [phone] [phone]) [phone]   Clinic Follow up Note   Patient Care Team: [name], [name], [name] as PCP - General (Physician Assistant) [name], MD as Consulting Physician (Orthopedic Surgery)  Date of Service:  09/30/2018  CHIEF COMPLAINT: F/u ofmetastatic breast cancer  SUMMARY OF ONCOLOGIC HISTORY: Oncology History Overview Note   Cancer Staging No matching staging information was found for the patient.     [name], right ([place])  11/1997 Cancer Diagnosis   Patient had 1.4 cm poorly differentiated right breast carcinoma diagnosed in Nov 1999 at age 76, 1/7 axillary nodes involved, ER/PR and HER 2 positive. She had mastectomy with the 7 axillary node evaluation, 4 cycles of adriamycin/ cytoxan followed by taxotere, then five years of tamoxifen thru June 2005 (no herceptin in 1999). She had bilateral oophorectomy in May 2005. She was briefly on aromatase inhibitor after tamoxifen, but discontinued this herself due to poor tolerance.   04/04/2015 Genetic Testing   Negative genetic testing on the breast/ovarian cancer pnael.  The Breast/Ovarian gene panel offered by [facility] includes sequencing and rearrangement analysis for the following 20 genes:  ATM, BARD1, BRCA1, BRCA2, BRIP1, CDH1, CHEK2, EPCAM, FANCC, MLH1, MSH2, MSH6, NBN, PALB2, PMS2, PTEN, RAD51C, RAD51D, TP53, and XRCC2.   06/26/2015 Pathology Results   Initial Path Report Bone, curettage, Left femur met, intramedullary subtroch tissue - METASTATIC ADENOCARCINOMA.  Estrogen Receptor: 95%, POSITIVE, STRONG STAINING INTENSITY Progesterone Receptor: 2%, POSITIVE, STRONG  HER2 (+), IHC 3+   06/26/2015 Surgery   Biopsy of metastatic tissue and stabilization with Affixus trochanteric nail, proximal and distal interlock of left femur by Dr. [name]    06/28/2015 Imaging   CT Chest w/ Contrast 1. Extensive right internal mammary chain  lymphadenopathy consistent with metastatic breast cancer. 2. Lytic metastases involving the posterior elements at T1 with probable nondisplaced pathologic fracture of the spinous process. 3. No other evidence of thoracic metastatic disease. 4. Trace bilateral pleural effusions with associated bibasilar atelectasis. 5. Indeterminate right thyroid nodule, not previously imaged. This could be evaluated with thyroid ultrasound as clinically warranted.   07/01/2015 Imaging   Bone Scan 1. Increased activity noted throughout the left femur. Although metastatic disease cannot be excluded, these changes are most likely secondary to prior surgery. 2. No other focal abnormalities identified to suggest metastatic disease.   07/12/2015 - 07/26/2015 Radiation Therapy   Left femur, 30 Gy in 10 fractions by Dr. Sondra Come    07/14/2015 Initial Diagnosis   [name], right ([place])   07/19/2015 Imaging   Initial PET Scan 1. Severe multifocal osseous metastatic disease, much greater than anticipated based on prior imaging. 2. Multifocal nodal metastases involving the right internal mammary, prevascular and subpectoral lymph nodes. No axillary pulmonary involvement identified. 3. No distant extra osseous metastases.   07/30/2015 - 11/29/2015 Chemotherapy   Docetaxel, Herceptin and Perjeta every 3 weeks X6 cycles, pt tolerated moderately well, restaging scan showed excellent response    09/18/2015 Imaging   CT Head w/wo Contrast 1. No acute intracranial abnormality or significant interval change. 2. Stable minimal periventricular white matter hypoattenuation on the right. This may reflect a remote ischemic injury. 3. No evidence for metastatic disease to the brain. 4. No focal soft tissue lesion to explain the patient's tenderness.   10/30/2015 Imaging   CT Abdomen Pelvis w/ Contrast 1. Few mildly prominent fluid-filled loops of small bowel scattered throughout the abdomen, with  intraluminal fluid density within the distal colon. Given the provided history, findings are suggestive of acute enteritis/diarrheal illness. 2. No other acute intra-abdominal or pelvic process identified. 3. Widespread osseous metastatic disease, better evaluated on most recent PET-CT from 07/19/2015. No pathologic fracture or other complication. 4. 11 mm hypodensity within the left kidney. This lesion measures intermediate density, and is indeterminate. While this lesion is similar in size relative to recent studies, this is increased in size relative to prior study from 2012. Further evaluation with dedicated renal mass protocol CT and/or MRI is recommended for complete characterization.   12/2015 - 05/14/2016 Chemotherapy   Maintenance Herceptin and pejeta every 3 weeks stopped on 05/14/16 due to disease progression   12/25/2015 Imaging   Restaging PET Scan 1. Markedly improved skeletal activity, with only several faint foci of residual accentuated metabolic activity, but resolution of the vast majority of the previously extensive osseous metastatic disease. 2. Resolution of the prior right internal mammary and right prevascular lymph nodes. 3. Residual subcutaneous edema and edema along fascia planes in the left upper thigh. This remains somewhat more than I would expect for placement of an IM nail 6 weeks ago. If there is leg swelling further down, consider Doppler venous ultrasound to rule out left lower extremity DVT. I do not perceive an obvious difference in density between the pelvic and common femoral veins on the noncontrast CT data. 4. Chronic right maxillary and right sphenoid sinusitis.   01/16/2016 - 10/20/2017 Anti-estrogen oral therapy   Letrozole 2.5 mg daily. She was switched to exemestane due to disease progression on 02/08/17. Stopped on 10/20/2017 when treatment changed to chemo    03/01/2016 Miscellaneous   Patient presented to ED following a fall; she reports she landed on her  back and is now experiencing back pain. The patient was evaluated and discharge home same day with pain control.   05/01/2016 Imaging   CT CAP IMPRESSION: 1. Stable exam.  No new or progressive disease identified. 2. No evidence for residual or recurrent adenopathy within the chest. 3. Stable bone metastasis.   05/01/2016 Imaging   BONE SCAN IMPRESSION: 1. Small focal uptake involving the anterior rib ends of the left second and right fourth ribs, new since the prior bone scan. The location of this uptake is more suggestive of a traumatic or inflammatory etiology as opposed to metastatic disease. No other evidence of metastatic disease. 2. There are other areas of stable uptake which are most likely degenerative/reactive, including right shoulder and cervical spine uptake and uptake along the right femur adjacent to the ORIF hardware.   05/20/2016 Imaging   NM PET Skull to Thigh IMPRESSION: 1. There multiple metastatic foci in the bony pelvis, including some new abnormal foci compare to the prior PET-CT, compatible with mildly progressive bony metastatic disease. Also, a left T11 vertebral hypermetabolic metastatic lesion is observed, new compared to the prior exam. 2. No active extraosseous malignancy is currently identified. 3. Right anterior fourth rib healing fracture, appears be   06/04/2016 - 01/08/2017 Chemotherapy   Second line chemotherapy Kadcyla every 3 weeks started on 06/04/16 and stopped on 01/08/17 due to mixed response.     10/05/2016 Imaging   CT CAP and Bone Scan:  Bones: left intramedullary rod and left femoral head neck screw in place. In the proximal diaphysis of the left femur there is cortical thickening and irregularity. No lytic or blastic bone lesions. No fracture or vertebral endplate destruction.   No definite evidence of recurrent  disease in the chest, abdomen, or pelvis.   10/28/2016 Imaging   CT A/P IMPRESSION: No acute process demonstrated in  the abdomen or pelvis. No evidence of bowel obstruction or inflammation.   11/04/2016 Imaging   DG foot complete left: IMPRESSION: No fracture or dislocation.  No soft tissue abnormality   11/23/2016 Imaging   CT RIGHT FEMUR IMPRESSION: Negative CT scan of the right thigh.  No visible metastatic disease.  CT LEFT FEMUR IMPRESSION: 1. Postsurgical changes related to prior cephalomedullary rod fixation of the left femur with linear lucency along the anterolateral proximal femoral cortex at level of the lesser trochanter, suspicious for nondisplaced fracture. 2. Slightly permeative appearance of the proximal left femoral cortex at the site of known prior osseous metastases is largely unchanged. 3. Unchanged patchy lucency and sclerosis along the anterior acetabulum, which appears to correspond with an area of faint uptake on the prior PET-CT, suspicious for osseous metastasis. These results will be called to the ordering clinician or representative by the Radiologist Assistant, and communication documented in the PACS or zVision Dashboard.     01/27/2017 Imaging   IMPRESSION: 1. Mixed response to osseous metastasis. Some hypermetabolic lesions are new and increasingly hypermetabolic, while another index lesion is decreasingly hypermetabolic. 2. No evidence of hypermetabolic soft tissue metastasis.    02/08/2017 - 10/19/2017 Antibody Plan   -Herceptin every 3 week since 02/08/17 -Oral Lanpantinib 1548m and decreased to 5027mdue to diarrhea and depression. Due to disease progression she swithced to Verzenio 15032mn 05/04/17. She did not toelrate well so she was switched to Ibrance 100m35m 05/31/17. Due to her neutropenia, Ibrance dose reduced to 75 mg daily, 3 weeks on, one-week off, stopped on 10/20/2017 due to disease progression.    04/30/2017 PET scan   IMPRESSION: 1. Primarily increase in hypermetabolism of osseous metastasis. An isolated right acetabular lesion is  decreased in hypermetabolism since the prior exam. 2. No evidence of hypermetabolic soft tissue metastasis. 3. Similar non FDG avid right-sided thyroid nodule, favoring a benign etiology. Recommend attention on follow-up.   08/30/2017 PET scan   08/30/2017 PET Scan  IMPRESSION: 1. Worsening osseous metastatic disease. 2. Focal hypermetabolism in the central spinal canal the L1 level, similar to the prior exam. Metastatic implant cannot be excluded. 3. Slight enlargement of a right thyroid nodule which now appears more cystic in character. Consider further evaluation with thyroid ultrasound. If patient is clinically hyperthyroid, consider nuclear medicine thyroid uptake and scan   08/30/2017 Progression   08/30/2017 PET Scan  IMPRESSION: 1. Worsening osseous metastatic disease. 2. Focal hypermetabolism in the central spinal canal the L1 level, similar to the prior exam. Metastatic implant cannot be excluded. 3. Slight enlargement of a right thyroid nodule which now appears more cystic in character. Consider further evaluation with thyroid ultrasound. If patient is clinically hyperthyroid, consider nuclear medicine thyroid uptake and scan   09/29/2017 - 10/11/2017 Radiation Therapy   Pt received 27 Gy in 9 fractions directed at the areas of significant uptake noted on recent bone scan the right pelvis and right proximal femur, with Dr, KinaSondra Come10/09/2017 - 04/07/2018 Chemotherapy   -Herceptin every 3 weeks starting 02/08/17, perjeta added on 10/20/2017  -weekly Taxol started on 10/21/2017. Will reduce Taxol to 2 weeks on, 1 week off starting on 02/04/17.  Stoped due to mild disease progression   10/27/2017 Imaging   10/27/2017 CT AP IMPRESSION: 1. No acute abnormality. No evidence of bowel obstruction or acute bowel  inflammation. Normal appendix. 2. Stable patchy sclerotic osseous metastases throughout the visualized skeleton. No new or progressive metastatic disease in the abdomen  or pelvis.   01/11/2018 Imaging   Whole Body Bone Scan 01/11/18  IMPRESSION: 1. Multifocal osseous metastases as described. 2. Right scapular metastasis. 3. Right superior thoracic spinous process. 4. Right sacral ala and right L5 metastases. 5. Right acetabular metastasis. 6. Right proximal femur metastasis. 7. Diffuse metastases throughout the left femur. 8. Anterior right fourth rib metastasis.   01/11/2018 Imaging   CT CAP W Contrast 01/11/18  IMPRESSION: 1. Similar-appearing osseous metastatic disease. 2. No evidence for progressed metastatic disease in the chest, abdomen or pelvis.   03/30/2018 Imaging   Bone Scan 03/30/18  IMPRESSION: Stable multifocal bony metastatic disease.   03/30/2018 Imaging    CT CAP W Contrast 03/30/18 IMPRESSION: 1. New mild contiguous wall thickening with associated mucosal hyperenhancement throughout the left colon and rectum, suggesting nonspecific infectious or inflammatory proctocolitis. Differential includes C diff colitis. 2. Stable faint patchy sclerosis in the iliac bones and lumbar vertebral bodies. No new or progressive osseous metastatic disease by CT. Please note that the osseous lesions are relatively occult by CT and would be better evaluated by PET-CT, as clinically warranted. 3. No evidence of extra osseous metastatic disease.  These results will be called to the ordering clinician or representative by the Radiologist Assistant, and communication documented in the PACS or zVision Dashboard.   04/15/2018 - 06/16/2018 Chemotherapy   Enhertu q3weeks starting 04/15/18. Stopped Enhertu after cycle 3 on 05/27/18 due to poor toleration.    04/21/2018 PET scan   PET 04/21/18  IMPRESSION: 1. Overall marked interval improvement in bony hypermetabolic disease. All of the previously identified hypermetabolic osseous metastases have decreased in hypermetabolism in the interval. There is a new tiny focus of hypermetabolism in the right aspect  of the L3 vertebral body that appears to correspond to a new 8 mm lucency, raising concern for new metastatic disease. Close attention on follow-up recommended. 2. Focal hypermetabolism in the central spinal canal at the level of L1 previously has resolved. 3. No hypermetabolic soft tissue disease on today's study. 4. Right thyroid nodule seen previously has decreased in the interval.    06/16/2018 Imaging   CT LUMBAR SPINE WO CONTRAST 06/16/18 IMPRESSION: 1. Transitional lumbosacral anatomy. Correlation with radiographs is recommended prior to any operative intervention. 2. Suspicion of a right L3 vertebral body metastasis on the April PET-CT is not evident by CT. Stable degenerative versus metastatic lesion at the right L5 superior endplate. Small but increased right iliac bone metastasis since March. Lumbar MRI would best characterize lumbar metastatic disease (without contrast if necessary). 3. Suspect L2-L3 left subarticular segment disc herniation. Query left L2 radiculitis. 4. Stable appearance of rightward L4-L5 disc degeneration which could affect the right foramen and right lateral recess.   06/17/2018 -  Chemotherapy   Restart Taxol 2 weeks on/1 week off with Herceptin and Perjeta q3weeks on 06/17/18   07/06/2018 PET scan   PET  IMPRESSION: 1. Near total resolution of hypermetabolic activity associated with skeletal metastasis. Very mild activity associated with the LEFT iliac bone which is similar to background blood pool activity. 2. No new or progressive disease.      CURRENT THERAPY:  Restart Taxol 2 weeks on/1 week off with Herceptin and Perjeta q3weeks on 06/17/18. Dose reduce Perjeta with cycle 5 due to diarrhea.Perjeta stopped after 07/29/18 due to persistent diarrhea  INTERVAL HISTORY:  EMALEY APPLIN  is here for a follow up and ongoing treatment. She presents to the clinic with her interpretor. She notes she is doing well and stable. She feels she recovered  well from chemo break last week. She notes she has been able to eat more. She notes only 1-2 days chemo she will have lower appetite. She notes her left pain continues to radiate to her right hip (4-5/10). She is able to ambulate adequately. She denies any falls. Her back pain only presents occasionally now which is manageable. She denies any new pain or symptoms. 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 This has been going on for the past few months. She notes occasional calf cramps. She takes 2 potassium daily. She notes left leg swelling when she walks for a while.     REVIEW OF SYSTEMS:   Constitutional: Denies fevers, chills or abnormal weight loss Eyes: Denies blurriness of vision Ears, nose, mouth, throat, and face: Denies mucositis or sore throat Respiratory: Denies cough, dyspnea or wheezes Cardiovascular: Denies palpitation, chest discomfort (+) Occasional left lower extremity swelling Gastrointestinal:  Denies nausea, heartburn or change in bowel habits Skin: Denies abnormal skin rashes MSK: (+) Right hip pain occasionally (+) Back pain manageable  Lymphatics: Denies new lymphadenopathy or easy bruising Neurological: (+) neuropathy of feet  Behavioral/Psych: Mood is stable, no new changes  All other systems were reviewed with the patient and are negative.  MEDICAL HISTORY:  Past Medical History:  Diagnosis Date  . Abnormal Pap smear   . Anxiety   . Breast cancer ([place])   . Cancer ([place]) 1999   breast-s/p mastectomy, chemo, rad  . CIN I (cervical intraepithelial neoplasia I) 2003   by colpo  . Congenital deafness   . Deaf   . Depression   . Port-A-Cath in place 2017   for chemotherapy  . Radiation 07/12/15-07/26/15   left femur 30 Gy  . S/P bilateral oophorectomy     SURGICAL HISTORY: Past Surgical History:  Procedure Laterality Date  . ABDOMINAL HYSTERECTOMY    . INTRAMEDULLARY (IM) NAIL INTERTROCHANTERIC Left 06/26/2015   Procedure:  LEFT BIOMET LONG AFFIXS NAIL;  Surgeon: [name], MD;  Location: [facility];  Service: Orthopedics;  Laterality: Left;  . IR GENERIC HISTORICAL  08/06/2015   IR US GUIDE VASC ACCESS LEFT 08/06/2015 Aletta Edouard, MD [facility]  . IR GENERIC HISTORICAL  08/06/2015   IR FLUORO GUIDE CV LINE LEFT 08/06/2015 Aletta Edouard, MD [facility]  . IR GENERIC HISTORICAL  03/05/2016   IR CV LINE INJECTION 03/05/2016 Markus Daft, MD [facility]  . LEFT HEART CATH AND CORONARY ANGIOGRAPHY N/A 12/13/2017   Procedure: LEFT HEART CATH AND CORONARY ANGIOGRAPHY;  Surgeon: Leonie Man, MD;  Location: [facility];  Service: Cardiovascular;  Laterality: N/A;  . MASTECTOMY     right breast  . MASTECTOMY    . OVARIAN CYST REMOVAL    . RADIOLOGY WITH ANESTHESIA Left 06/25/2015   Procedure: MRI OF LEFT HIP WITH OR WITHOUT CONTRAST;  Surgeon: Medication Radiologist, MD;  Location: [facility];  Service: Radiology;  Laterality: Left;  DR. MCINTYRE/MRI  . TUBAL LIGATION      I have reviewed the social history and family history with the patient and they are unchanged from previous note.  ALLERGIES:  is allergic to chlorhexidine; promethazine hcl; and diphenhydramine hcl.  MEDICATIONS:  Current Outpatient Medications  Medication Sig Dispense Refill  . albuterol (PROVENTIL HFA;VENTOLIN  HFA) 108 (90 Base) MCG/ACT inhaler Inhale 2 puffs into the lungs every 6 (six) hours as needed for wheezing or shortness of breath. 1 Inhaler 0  . ALPRAZolam (XANAX) 0.25 MG tablet Take 1 tablet (0.25 mg total) by mouth 2 (two) times daily as needed for anxiety. 30 tablet 0  . dexamethasone (DECADRON) 4 MG tablet Take 1 tablet (4 mg total) by mouth daily. 20 tablet 0  . diphenoxylate-atropine (LOMOTIL) 2.5-0.025 MG tablet Take 1-2 tablets by mouth 4 (four) times daily as needed for diarrhea or loose stools. 30 tablet 1  . lidocaine-prilocaine (EMLA) cream Apply 1 application topically as needed. Apply to Porta-Cath 1-2 hours prior to  access as directed. 90 g 2  . methocarbamol (ROBAXIN) 500 MG tablet Take 1 tablet (500 mg total) by mouth 2 (two) times daily. 20 tablet 0  . omeprazole (PRILOSEC) 20 MG capsule Take 1 capsule (20 mg total) by mouth daily. 30 capsule 0  . ondansetron (ZOFRAN) 8 MG tablet Take 1 tablet (8 mg total) by mouth every 8 (eight) hours as needed for nausea or vomiting. 45 tablet 1  . oxyCODONE (ROXICODONE) 5 MG immediate release tablet Take 1 tablet (5 mg total) by mouth every 8 (eight) hours as needed for severe pain. 60 tablet 0  . potassium chloride (K-DUR) 10 MEQ tablet Take 2 tablets (20 mEq total) by mouth 2 (two) times daily. 180 tablet 3  . prochlorperazine (COMPAZINE) 10 MG tablet Take 1 tablet (10 mg total) by mouth every 6 (six) hours as needed for nausea or vomiting. 60 tablet 2  . zolpidem (AMBIEN) 5 MG tablet Take 1.5 tablets (7.5 mg total) by mouth at bedtime as needed for sleep. 45 tablet 0   No current facility-administered medications for this visit.    Facility-Administered Medications Ordered in Other Visits  Medication Dose Route Frequency Provider Last Rate Last Dose  . potassium chloride SA (K-DUR) CR tablet 40 mEq  40 mEq Oral Once Truitt Merle, MD      . sodium chloride 0.9 % 1,000 mL with potassium chloride 10 mEq infusion   Intravenous Continuous Gordy Levan, MD   Stopped at 09/10/15 1653  . sodium chloride 0.9 % injection 10 mL  10 mL Intravenous PRN Livesay, Lennis P, MD        PHYSICAL EXAMINATION: ECOG PERFORMANCE STATUS: 2 - Symptomatic, <50% confined to bed  Vitals:   09/30/18 0919  BP: 94/64  Pulse: 72  Resp: 17  Temp: 97.9 F (36.6 C)  SpO2: 99%   Filed Weights   09/30/18 0919  Weight: 133 lb 3.2 oz (60.4 kg)    GENERAL:alert, no distress and comfortable SKIN: skin color, texture, turgor are normal, no rashes or significant lesions EYES: normal, Conjunctiva are pink and non-injected, sclera clear  NECK: supple, thyroid normal size, non-tender,  without nodularity LYMPH:  no palpable lymphadenopathy in the cervical, axillary  LUNGS: clear to auscultation and percussion with normal breathing effort HEART: regular rate & rhythm and no murmurs and no lower extremity edema ABDOMEN:abdomen soft, non-tender and normal bowel sounds Musculoskeletal:no cyanosis of digits and no clubbing  NEURO: alert & oriented x 3 with fluent speech, no focal motor/sensory deficits  LABORATORY DATA:  I have reviewed the data as listed CBC Latest Ref Rng & Units 09/30/2018 09/16/2018 09/09/2018  WBC 4.0 - 10.5 K/uL 3.5(L) 4.5 4.1  Hemoglobin 12.0 - 15.0 g/dL 10.2(L) 10.2(L) 10.6(L)  Hematocrit 36.0 - 46.0 % 30.2(L) 29.8(L) 30.9(L)  Platelets  150 - 400 K/uL 175 196 197     CMP Latest Ref Rng & Units 09/30/2018 09/16/2018 09/09/2018  Glucose 70 - 99 mg/dL 84 89 87  BUN 6 - 20 mg/dL _0 Creatinine 0.44 - 1.00 mg/dL 0.79 0.79 0.80  Sodium 135 - 145 mmol/L 143 141 143  Potassium 3.5 - 5.1 mmol/L 3.0(LL) 3.0(LL) 3.4(L)  Chloride 98 - 111 mmol/L 107 105 107  CO2 22 - 32 mmol/L _1 Calcium 8.9 - 10.3 mg/dL 8.7(L) 8.6(L) 8.7(L)  Total Protein 6.5 - 8.1 g/dL 5.9(L) 6.1(L) 6.2(L)  Total Bilirubin 0.3 - 1.2 mg/dL 1.8(H) 1.5(H) 1.5(H)  Alkaline Phos 38 - 126 U/L 56 61 62  AST 15 - 41 U/L 14(L) 13(L) 13(L)  ALT 0 - 44 U/L _2 RADIOGRAPHIC STUDIES: I have personally reviewed the radiological images as listed and agreed with the findings in the report. No results found.   ASSESSMENT & PLAN:  EDMUND RICK is a 53 y.o. 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 Vernia Buff recently [employer], Herceptin and Perjetaq3weeks,tolerated moderately well, had overall excellent response based on PET in 04/2018  -Shecompleted 3 cycles of Enhertu and stopped due to poor toleration with anorexia, weight loss, N&V, diarrhea -I restarted her onTaxol 2 weeks on/1 week off with Herceptin and Perjeta q3weekson 06/17/18, she tolerated first cycle well then developed diarrhea secondary to Perjeta.I d/cbenadryl given allergy.Due to persistent diarrhea, Perjeta was stopped after C5 on 07/29/18.  -She is clinically doing well and stable. 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 Physical exam unremarkable. Labs reviewed, CBC and CMP WNL except WBC 3.5, Hg 10.2, K 3, Ca 8.7, Protein 5.9. Overall adequate to proceed with Taxol/Herceptin today -she will return next week for taxol -plan to repeat scan in late Oct -F/u in 3 weeks  -I offered her the flu shot today and she agreed to receive it.    2.Body pain, secondary to bone mets, Hypocalcemia  -since her cancer diagnosis,she has had b/l hip, shoulder and chest painat different time points, likely related to her bone mets -Ipreviouslyprescribed herGabapentin, butshedecided not to take due to potential side effect of suicidal thoughts. 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 -Will monitor for need of palliative radiation. -Her back and chest pain now resolved. Her main pain is in upper left leg. She only takes oxycodone 1-2 times a day now. -continue Zometa every 3 months  -Her Ca at 8.7 likely secondary to Zometa. She does have occasional calf cramps. I recommended she take OTC calcium and Vitamin D. I will check her Vit D.   3.Congenital deafness  4.Mild anemia, secondary to chemotherapy and malignancy -She will start with oral iron daily for 1-2 months and then stop. She will also start daily women's vitamin. -Hgat10.2 (09/30/18), stable   5.Hypokalemia, Diarrhea -He has intermittent hyperkalemia, on oral  supplementKCL. -Ipreviouslyinstructed her to reduce dairy products in diet. -She developed diarrhea s/p cycle 3 Perjeta. Will dose reduce. -Kat3(09/30/18).Will check her Magnesium level today. She will takes Potassium47mq in infusion today and then take 135m TID daily.     6.Goal of care discussion -she understand  her goal of care is palliative to prolong her life -she is full code for now  7.Insomnia -She cannot take Benadryl due to allergy reaction. She has tried trazodone with no relief.  -I advised her to avoid taking Ambien 4m, oxycodone and Xanax all at the same time as this is too much sedation. Oxycodone can be taken with Ambien or with Xanax. She understands. -Stable.  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 -Her neuropathy is currently mild and stable in her feet and LE, will monitor on treatment. -she declined using ice bags using infusion  -Stable and manageable.    PLAN: -Flu shot today -She will start Calcium and Vitamin D daily   -Lab reviewed, overall adequate to proceed with Taxol, Herceptintoday -she will return next week for Taxol -Lab, flush,f/u andtaxol/Herceptinin3 weeks, will order restaging scan on next visit  -increase oral KCL 181m tid, I also called magnesium oxide 40095maily for her    No problem-specific Assessment & Plan notes found for this encounter.   Orders Placed This Encounter  Procedures  . Vitamin D 25 hydroxy    Standing Status:   Standing    Number of Occurrences:   20    Standing Expiration Date:   09/30/2023  . Magnesium    Standing Status:   Standing    Number of Occurrences:   20    Standing Expiration Date:   09/30/2023   All questions were answered. The patient knows to call the clinic with any problems, questions or concerns. No barriers to learning was detected. I spent 20 minutes counseling the patient face to face. The total time spent in the appointment was 25 minutes and more than 50% was  on counseling and review of test results     YanTruitt MerleD 09/30/2018   I, AmoJoslyn Devonm acting as scribe for YanTruitt MerleD.   I have reviewed the above documentation for accuracy and completeness, and I agree with the above.

## 2018-09-30 ENCOUNTER — Inpatient Hospital Stay: Payer: Medicaid Other

## 2018-09-30 ENCOUNTER — Inpatient Hospital Stay (HOSPITAL_BASED_OUTPATIENT_CLINIC_OR_DEPARTMENT_OTHER): Payer: Medicaid Other | Admitting: Hematology

## 2018-09-30 ENCOUNTER — Other Ambulatory Visit: Payer: Self-pay

## 2018-09-30 VITALS — BP 94/64 | HR 72 | Temp 97.9°F | Resp 17 | Ht 60.5 in | Wt 133.2 lb

## 2018-09-30 DIAGNOSIS — C50911 Malignant neoplasm of unspecified site of right female breast: Secondary | ICD-10-CM

## 2018-09-30 DIAGNOSIS — Z5112 Encounter for antineoplastic immunotherapy: Secondary | ICD-10-CM | POA: Diagnosis not present

## 2018-09-30 DIAGNOSIS — C7951 Secondary malignant neoplasm of bone: Secondary | ICD-10-CM

## 2018-09-30 DIAGNOSIS — Z95828 Presence of other vascular implants and grafts: Secondary | ICD-10-CM

## 2018-09-30 DIAGNOSIS — Z0000 Encounter for general adult medical examination without abnormal findings: Secondary | ICD-10-CM

## 2018-09-30 LAB — CBC WITH DIFFERENTIAL (CANCER CENTER ONLY)
Abs Immature Granulocytes: 0.03 10*3/uL (ref 0.00–0.07)
Basophils Absolute: 0 10*3/uL (ref 0.0–0.1)
Basophils Relative: 0 %
Eosinophils Absolute: 0 10*3/uL (ref 0.0–0.5)
Eosinophils Relative: 1 %
HCT: 30.2 % — ABNORMAL LOW (ref 36.0–46.0)
Hemoglobin: 10.2 g/dL — ABNORMAL LOW (ref 12.0–15.0)
Immature Granulocytes: 1 %
Lymphocytes Relative: 21 %
Lymphs Abs: 0.8 10*3/uL (ref 0.7–4.0)
MCH: 29.9 pg (ref 26.0–34.0)
MCHC: 33.8 g/dL (ref 30.0–36.0)
MCV: 88.6 fL (ref 80.0–100.0)
Monocytes Absolute: 0.3 10*3/uL (ref 0.1–1.0)
Monocytes Relative: 10 %
Neutro Abs: 2.3 10*3/uL (ref 1.7–7.7)
Neutrophils Relative %: 67 %
Platelet Count: 175 10*3/uL (ref 150–400)
RBC: 3.41 MIL/uL — ABNORMAL LOW (ref 3.87–5.11)
RDW: 13.8 % (ref 11.5–15.5)
WBC Count: 3.5 10*3/uL — ABNORMAL LOW (ref 4.0–10.5)
nRBC: 0 % (ref 0.0–0.2)

## 2018-09-30 LAB — CMP (CANCER CENTER ONLY)
ALT: 14 U/L (ref 0–44)
AST: 14 U/L — ABNORMAL LOW (ref 15–41)
Albumin: 3.5 g/dL (ref 3.5–5.0)
Alkaline Phosphatase: 56 U/L (ref 38–126)
Anion gap: 10 (ref 5–15)
BUN: 8 mg/dL (ref 6–20)
CO2: 26 mmol/L (ref 22–32)
Calcium: 8.7 mg/dL — ABNORMAL LOW (ref 8.9–10.3)
Chloride: 107 mmol/L (ref 98–111)
Creatinine: 0.79 mg/dL (ref 0.44–1.00)
GFR, Est AFR Am: >60 mL/min (ref >60)
GFR, Estimated: >60 mL/min (ref >60)
Glucose, Bld: 84 mg/dL (ref 70–99)
Potassium: 3 mmol/L — CL (ref 3.5–5.1)
Sodium: 143 mmol/L (ref 135–145)
Total Bilirubin: 1.8 mg/dL — ABNORMAL HIGH (ref 0.3–1.2)
Total Protein: 5.9 g/dL — ABNORMAL LOW (ref 6.5–8.1)

## 2018-09-30 LAB — MAGNESIUM: Magnesium: 1.6 mg/dL — ABNORMAL LOW (ref 1.7–2.4)

## 2018-09-30 MED ORDER — SODIUM CHLORIDE 0.9% FLUSH
10.0000 mL | Freq: Once | INTRAVENOUS | Status: AC
  Administered 2018-09-30: 10 mL
  Filled 2018-09-30: qty 10

## 2018-09-30 MED ORDER — HEPARIN SOD (PORK) LOCK FLUSH 100 UNIT/ML IV SOLN
500.0000 [IU] | Freq: Once | INTRAVENOUS | Status: AC | PRN
  Administered 2018-09-30: 500 [IU]
  Filled 2018-09-30: qty 5

## 2018-09-30 MED ORDER — LORATADINE 10 MG PO TABS
10.0000 mg | ORAL_TABLET | Freq: Once | ORAL | Status: AC
  Administered 2018-09-30: 10 mg via ORAL

## 2018-09-30 MED ORDER — DEXAMETHASONE SODIUM PHOSPHATE 10 MG/ML IJ SOLN
INTRAMUSCULAR | Status: AC
  Filled 2018-09-30: qty 1

## 2018-09-30 MED ORDER — ACETAMINOPHEN 325 MG PO TABS
ORAL_TABLET | ORAL | Status: AC
  Filled 2018-09-30: qty 2

## 2018-09-30 MED ORDER — INFLUENZA VAC SPLIT QUAD 0.5 ML IM SUSY
0.5000 mL | PREFILLED_SYRINGE | Freq: Once | INTRAMUSCULAR | Status: AC
  Administered 2018-09-30: 0.5 mL via INTRAMUSCULAR

## 2018-09-30 MED ORDER — SODIUM CHLORIDE 0.9% FLUSH
10.0000 mL | INTRAVENOUS | Status: DC | PRN
  Administered 2018-09-30: 10 mL
  Filled 2018-09-30: qty 10

## 2018-09-30 MED ORDER — SODIUM CHLORIDE 0.9 % IV SOLN
Freq: Once | INTRAVENOUS | Status: AC
  Administered 2018-09-30: 10:00:00 via INTRAVENOUS
  Filled 2018-09-30: qty 250

## 2018-09-30 MED ORDER — SODIUM CHLORIDE 0.9 % IV SOLN
60.0000 mg/m2 | Freq: Once | INTRAVENOUS | Status: AC
  Administered 2018-09-30: 12:00:00 96 mg via INTRAVENOUS
  Filled 2018-09-30: qty 16

## 2018-09-30 MED ORDER — ACETAMINOPHEN 325 MG PO TABS
650.0000 mg | ORAL_TABLET | Freq: Once | ORAL | Status: AC
  Administered 2018-09-30: 650 mg via ORAL

## 2018-09-30 MED ORDER — INFLUENZA VAC SPLIT QUAD 0.5 ML IM SUSY
PREFILLED_SYRINGE | INTRAMUSCULAR | Status: AC
  Filled 2018-09-30: qty 0.5

## 2018-09-30 MED ORDER — TRASTUZUMAB-DKST CHEMO 150 MG IV SOLR
6.0000 mg/kg | Freq: Once | INTRAVENOUS | Status: AC
  Administered 2018-09-30: 357 mg via INTRAVENOUS
  Filled 2018-09-30: qty 17

## 2018-09-30 MED ORDER — FAMOTIDINE IN NACL 20-0.9 MG/50ML-% IV SOLN
INTRAVENOUS | Status: AC
  Filled 2018-09-30: qty 50

## 2018-09-30 MED ORDER — LORATADINE 10 MG PO TABS
ORAL_TABLET | ORAL | Status: AC
  Filled 2018-09-30: qty 1

## 2018-09-30 MED ORDER — FAMOTIDINE IN NACL 20-0.9 MG/50ML-% IV SOLN
20.0000 mg | Freq: Once | INTRAVENOUS | Status: AC
  Administered 2018-09-30: 20 mg via INTRAVENOUS

## 2018-09-30 MED ORDER — DEXAMETHASONE SODIUM PHOSPHATE 10 MG/ML IJ SOLN
10.0000 mg | Freq: Once | INTRAMUSCULAR | Status: AC
  Administered 2018-09-30: 10 mg via INTRAVENOUS

## 2018-09-30 NOTE — Patient Instructions (Signed)
[facility] Discharge Instructions for Patients Receiving Chemotherapy  Today you received the following chemotherapy agents Trastuzumab and Taxol  To help prevent nausea and vomiting after your treatment, we encourage you to take your nausea medication as directed.    If you develop nausea and vomiting that is not controlled by your nausea medication, call the clinic.   BELOW ARE SYMPTOMS THAT SHOULD BE REPORTED IMMEDIATELY:  *FEVER GREATER THAN 100.5 F  *CHILLS WITH OR WITHOUT FEVER  NAUSEA AND VOMITING THAT IS NOT CONTROLLED WITH YOUR NAUSEA MEDICATION  *UNUSUAL SHORTNESS OF BREATH  *UNUSUAL BRUISING OR BLEEDING  TENDERNESS IN MOUTH AND THROAT WITH OR WITHOUT PRESENCE OF ULCERS  *URINARY PROBLEMS  *BOWEL PROBLEMS  UNUSUAL RASH Items with * indicate a potential emergency and should be followed up as soon as possible.  Feel free to call the clinic should you have any questions or concerns. The clinic phone number is [phone].  Please show the CHEMO ALERT CARD at check-in to the Emergency Department and triage nurse.   

## 2018-09-30 NOTE — Progress Notes (Signed)
CRITICAL VALUE STICKER  CRITICAL VALUE: Potassium 3.0  RECEIVER (on-site recipient of call): [name]  DATE & TIME NOTIFIED: 09/30/18 09:35  MESSENGER (representative from lab):  MD NOTIFIED: Truitt Merle, MD  RESPONSE: Have patient increase oral potassium supplementation to 10 mEq TID. Patient brought her own supply of potassium and will take 2 tablets in treatment area. Infusion RN updated on plan.

## 2018-10-01 ENCOUNTER — Encounter: Payer: Self-pay | Admitting: Hematology

## 2018-10-01 MED ORDER — MAGNESIUM OXIDE 400 (241.3 MG) MG PO TABS: 400.0000 mg | ORAL_TABLET | Freq: Every day | ORAL | 3 refills | Status: DC

## 2018-10-03 ENCOUNTER — Telehealth: Payer: Self-pay

## 2018-10-03 ENCOUNTER — Telehealth: Payer: Self-pay | Admitting: Hematology

## 2018-10-03 NOTE — Telephone Encounter (Signed)
-----   Message from Truitt Merle, MD sent at 10/01/2018  5:37 PM EDT ----- Please let pt know her Mag was slightly low, and I have called in [place] 400mg  daily, and make sure she is taking KCL three times a day, thanks   Truitt Merle  10/01/2018

## 2018-10-03 NOTE — Telephone Encounter (Signed)
Left vociemail through interpreter service to let [name] know of Magnesium prescription to pick up and start taking, and to ensure she is taking her Potassium supplement 3 times a day. Instructed to call our office back if patient has any questions/concerns before her next set of appointments on 10/07/18

## 2018-10-03 NOTE — Telephone Encounter (Signed)
Scheduled appt per 9/18 los -  

## 2018-10-07 ENCOUNTER — Inpatient Hospital Stay: Payer: Medicaid Other

## 2018-10-07 ENCOUNTER — Other Ambulatory Visit: Payer: Self-pay | Admitting: Hematology

## 2018-10-07 ENCOUNTER — Other Ambulatory Visit: Payer: Self-pay

## 2018-10-07 VITALS — BP 120/74 | HR 66 | Temp 97.8°F | Resp 18

## 2018-10-07 DIAGNOSIS — C50911 Malignant neoplasm of unspecified site of right female breast: Secondary | ICD-10-CM

## 2018-10-07 DIAGNOSIS — C7951 Secondary malignant neoplasm of bone: Secondary | ICD-10-CM

## 2018-10-07 DIAGNOSIS — Z5112 Encounter for antineoplastic immunotherapy: Secondary | ICD-10-CM | POA: Diagnosis not present

## 2018-10-07 DIAGNOSIS — Z95828 Presence of other vascular implants and grafts: Secondary | ICD-10-CM

## 2018-10-07 LAB — CBC WITH DIFFERENTIAL (CANCER CENTER ONLY)
Abs Immature Granulocytes: 0.09 10*3/uL — ABNORMAL HIGH (ref 0.00–0.07)
Basophils Absolute: 0 10*3/uL (ref 0.0–0.1)
Basophils Relative: 1 %
Eosinophils Absolute: 0.1 10*3/uL (ref 0.0–0.5)
Eosinophils Relative: 2 %
HCT: 30.2 % — ABNORMAL LOW (ref 36.0–46.0)
Hemoglobin: 10.2 g/dL — ABNORMAL LOW (ref 12.0–15.0)
Immature Granulocytes: 2 %
Lymphocytes Relative: 25 %
Lymphs Abs: 1.1 10*3/uL (ref 0.7–4.0)
MCH: 30.1 pg (ref 26.0–34.0)
MCHC: 33.8 g/dL (ref 30.0–36.0)
MCV: 89.1 fL (ref 80.0–100.0)
Monocytes Absolute: 0.3 10*3/uL (ref 0.1–1.0)
Monocytes Relative: 7 %
Neutro Abs: 2.6 10*3/uL (ref 1.7–7.7)
Neutrophils Relative %: 63 %
Platelet Count: 185 10*3/uL (ref 150–400)
RBC: 3.39 MIL/uL — ABNORMAL LOW (ref 3.87–5.11)
RDW: 13.8 % (ref 11.5–15.5)
WBC Count: 4.2 10*3/uL (ref 4.0–10.5)
nRBC: 0 % (ref 0.0–0.2)

## 2018-10-07 LAB — CMP (CANCER CENTER ONLY)
ALT: 13 U/L (ref 0–44)
AST: 13 U/L — ABNORMAL LOW (ref 15–41)
Albumin: 3.6 g/dL (ref 3.5–5.0)
Alkaline Phosphatase: 59 U/L (ref 38–126)
Anion gap: 11 (ref 5–15)
BUN: 8 mg/dL (ref 6–20)
CO2: 24 mmol/L (ref 22–32)
Calcium: 8.6 mg/dL — ABNORMAL LOW (ref 8.9–10.3)
Chloride: 106 mmol/L (ref 98–111)
Creatinine: 0.79 mg/dL (ref 0.44–1.00)
GFR, Est AFR Am: >60 mL/min (ref >60)
GFR, Estimated: >60 mL/min (ref >60)
Glucose, Bld: 95 mg/dL (ref 70–99)
Potassium: 3.1 mmol/L — ABNORMAL LOW (ref 3.5–5.1)
Sodium: 141 mmol/L (ref 135–145)
Total Bilirubin: 1.8 mg/dL — ABNORMAL HIGH (ref 0.3–1.2)
Total Protein: 6.1 g/dL — ABNORMAL LOW (ref 6.5–8.1)

## 2018-10-07 MED ORDER — SODIUM CHLORIDE 0.9% FLUSH
10.0000 mL | Freq: Once | INTRAVENOUS | Status: AC
  Administered 2018-10-07: 08:00:00 10 mL
  Filled 2018-10-07: qty 10

## 2018-10-07 MED ORDER — FAMOTIDINE IN NACL 20-0.9 MG/50ML-% IV SOLN
20.0000 mg | Freq: Once | INTRAVENOUS | Status: AC
  Administered 2018-10-07: 20 mg via INTRAVENOUS

## 2018-10-07 MED ORDER — SODIUM CHLORIDE 0.9% FLUSH
10.0000 mL | Freq: Once | INTRAVENOUS | Status: DC
  Filled 2018-10-07: qty 10

## 2018-10-07 MED ORDER — DEXAMETHASONE SODIUM PHOSPHATE 10 MG/ML IJ SOLN
INTRAMUSCULAR | Status: AC
  Filled 2018-10-07: qty 1

## 2018-10-07 MED ORDER — SODIUM CHLORIDE 0.9% FLUSH
10.0000 mL | INTRAVENOUS | Status: DC | PRN
  Administered 2018-10-07: 10 mL
  Filled 2018-10-07: qty 10

## 2018-10-07 MED ORDER — HEPARIN SOD (PORK) LOCK FLUSH 100 UNIT/ML IV SOLN
500.0000 [IU] | Freq: Once | INTRAVENOUS | Status: AC | PRN
  Administered 2018-10-07: 12:00:00 500 [IU]
  Filled 2018-10-07: qty 5

## 2018-10-07 MED ORDER — SODIUM CHLORIDE 0.9 % IV SOLN
Freq: Once | INTRAVENOUS | Status: AC
  Administered 2018-10-07: 09:00:00 via INTRAVENOUS
  Filled 2018-10-07: qty 250

## 2018-10-07 MED ORDER — SODIUM CHLORIDE 0.9 % IJ SOLN: 10.0000 mL | INTRAMUSCULAR | Status: DC | PRN

## 2018-10-07 MED ORDER — FAMOTIDINE IN NACL 20-0.9 MG/50ML-% IV SOLN
INTRAVENOUS | Status: AC
  Filled 2018-10-07: qty 50

## 2018-10-07 MED ORDER — ZOLPIDEM TARTRATE 5 MG PO TABS: 7.5000 mg | ORAL_TABLET | Freq: Every evening | ORAL | 0 refills | Status: DC | PRN

## 2018-10-07 MED ORDER — DEXAMETHASONE SODIUM PHOSPHATE 10 MG/ML IJ SOLN
10.0000 mg | Freq: Once | INTRAMUSCULAR | Status: AC
  Administered 2018-10-07: 10 mg via INTRAVENOUS

## 2018-10-07 MED ORDER — SODIUM CHLORIDE 0.9 % IV SOLN
60.0000 mg/m2 | Freq: Once | INTRAVENOUS | Status: AC
  Administered 2018-10-07: 96 mg via INTRAVENOUS
  Filled 2018-10-07: qty 16

## 2018-10-07 NOTE — Progress Notes (Signed)
Per Dr. Burr Medico, okay to treat with Total Bil. Of 1.8

## 2018-10-07 NOTE — Patient Instructions (Signed)

## 2018-10-07 NOTE — Patient Instructions (Signed)
[facility] Discharge Instructions for Patients Receiving Chemotherapy  Today you received the following chemotherapy agents Paclitaxel (TAXOL).  To help prevent nausea and vomiting after your treatment, we encourage you to take your nausea medication as prescribed.  If you develop nausea and vomiting that is not controlled by your nausea medication, call the clinic.   BELOW ARE SYMPTOMS THAT SHOULD BE REPORTED IMMEDIATELY:  *FEVER GREATER THAN 100.5 F  *CHILLS WITH OR WITHOUT FEVER  NAUSEA AND VOMITING THAT IS NOT CONTROLLED WITH YOUR NAUSEA MEDICATION  *UNUSUAL SHORTNESS OF BREATH  *UNUSUAL BRUISING OR BLEEDING  TENDERNESS IN MOUTH AND THROAT WITH OR WITHOUT PRESENCE OF ULCERS  *URINARY PROBLEMS  *BOWEL PROBLEMS  UNUSUAL RASH Items with * indicate a potential emergency and should be followed up as soon as possible.  Feel free to call the clinic should you have any questions or concerns. The clinic phone number is [phone].  Please show the CHEMO ALERT CARD at check-in to the Emergency Department and triage nurse.  Coronavirus (COVID-19) Are you at risk?  Are you at risk for the Coronavirus (COVID-19)?  To be considered HIGH RISK for Coronavirus (COVID-19), you have to meet the following criteria:  . Traveled to [place], [place], [place], [place] or [place]; or in the [place] to [place], [place], [place], or [place]; and have fever, cough, and shortness of breath within the last 2 weeks of travel OR . Been in close contact with a person diagnosed with COVID-19 within the last 2 weeks and have fever, cough, and shortness of breath . IF YOU DO NOT MEET THESE CRITERIA, YOU ARE CONSIDERED LOW RISK FOR COVID-19.  What to do if you are HIGH RISK for COVID-19?  . If you are having a medical emergency, call 911. . Seek medical care right away. Before you go to a doctor's office, urgent care or emergency department, call ahead and tell them about  your recent travel, contact with someone diagnosed with COVID-19, and your symptoms. You should receive instructions from your physician's office regarding next steps of care.  . When you arrive at healthcare provider, tell the healthcare staff immediately you have returned from visiting [place], [place], [place], [place] or [place]; or traveled in the [place] to [place], [place], [place], or [place]; in the last two weeks or you have been in close contact with a person diagnosed with COVID-19 in the last 2 weeks.   . Tell the health care staff about your symptoms: fever, cough and shortness of breath. . After you have been seen by a medical provider, you will be either: o Tested for (COVID-19) and discharged home on quarantine except to seek medical care if symptoms worsen, and asked to  - Stay home and avoid contact with others until you get your results (4-5 days)  - Avoid travel on public transportation if possible (such as bus, train, or airplane) or o Sent to the Emergency Department by EMS for evaluation, COVID-19 testing, and possible admission depending on your condition and test results.  What to do if you are LOW RISK for COVID-19?  Reduce your risk of any infection by using the same precautions used for avoiding the common cold or flu:  . Wash your hands often with soap and warm water for at least 20 seconds.  If soap and water are not readily available, use an alcohol-based hand sanitizer with at least 60% alcohol.  . If coughing or sneezing,   cover your mouth and nose by coughing or sneezing into the elbow areas of your shirt or coat, into a tissue or into your sleeve (not your hands). . Avoid shaking hands with others and consider head nods or verbal greetings only. . Avoid touching your eyes, nose, or mouth with unwashed hands.  . Avoid close contact with people who are sick. . Avoid places or events with large numbers of people in one location, like concerts or sporting  events. . Carefully consider travel plans you have or are making. . If you are planning any travel outside or inside the [place], visit the CDC's Travelers' Health webpage for the latest health notices. . If you have some symptoms but not all symptoms, continue to monitor at home and seek medical attention if your symptoms worsen. . If you are having a medical emergency, call 911.   ADDITIONAL HEALTHCARE OPTIONS FOR PATIENTS  [facility] / e-Visit: [email or web address]         [facility]: [phone]  [facility]: [phone]                   [facility]: [phone]     

## 2018-10-08 LAB — VITAMIN D 25 HYDROXY (VIT D DEFICIENCY, FRACTURES): Vit D, 25-Hydroxy: 18.4 ng/mL — ABNORMAL LOW (ref 30.0–100.0)

## 2018-10-12 ENCOUNTER — Emergency Department (HOSPITAL_COMMUNITY): Payer: Medicaid Other

## 2018-10-12 ENCOUNTER — Other Ambulatory Visit: Payer: Self-pay

## 2018-10-12 ENCOUNTER — Emergency Department (HOSPITAL_COMMUNITY)
Admission: EM | Admit: 2018-10-12 | Discharge: 2018-10-12 | Disposition: A | Discharge status: Home / Self Care | Payer: Medicaid Other | Attending: Emergency Medicine | Admitting: Emergency Medicine

## 2018-10-12 DIAGNOSIS — Z79899 Other long term (current) drug therapy: Secondary | ICD-10-CM | POA: Diagnosis not present

## 2018-10-12 DIAGNOSIS — R0789 Other chest pain: Secondary | ICD-10-CM | POA: Diagnosis not present

## 2018-10-12 DIAGNOSIS — R079 Chest pain, unspecified: Secondary | ICD-10-CM | POA: Diagnosis not present

## 2018-10-12 DIAGNOSIS — Z853 Personal history of malignant neoplasm of breast: Secondary | ICD-10-CM | POA: Diagnosis not present

## 2018-10-12 DIAGNOSIS — R072 Precordial pain: Secondary | ICD-10-CM | POA: Insufficient documentation

## 2018-10-12 DIAGNOSIS — R0602 Shortness of breath: Secondary | ICD-10-CM | POA: Diagnosis not present

## 2018-10-12 LAB — BASIC METABOLIC PANEL
Anion gap: 7 (ref 5–15)
BUN: 8 mg/dL (ref 6–20)
CO2: 26 mmol/L (ref 22–32)
Calcium: 8.7 mg/dL — ABNORMAL LOW (ref 8.9–10.3)
Chloride: 105 mmol/L (ref 98–111)
Creatinine, Ser: 0.76 mg/dL (ref 0.44–1.00)
GFR calc Af Amer: >60 mL/min (ref >60)
GFR calc non Af Amer: >60 mL/min (ref >60)
Glucose, Bld: 95 mg/dL (ref 70–99)
Potassium: 3.6 mmol/L (ref 3.5–5.1)
Sodium: 138 mmol/L (ref 135–145)

## 2018-10-12 LAB — CBC
HCT: 37.8 % (ref 36.0–46.0)
Hemoglobin: 13.1 g/dL (ref 12.0–15.0)
MCH: 30.9 pg (ref 26.0–34.0)
MCHC: 34.7 g/dL (ref 30.0–36.0)
MCV: 89.2 fL (ref 80.0–100.0)
Platelets: 149 10*3/uL — ABNORMAL LOW (ref 150–400)
RBC: 4.24 MIL/uL (ref 3.87–5.11)
RDW: 13.7 % (ref 11.5–15.5)
WBC: 2.5 10*3/uL — ABNORMAL LOW (ref 4.0–10.5)
nRBC: 0 % (ref 0.0–0.2)

## 2018-10-12 LAB — I-STAT BETA HCG BLOOD, ED (MC, WL, AP ONLY): I-stat hCG, quantitative: <5 m[IU]/mL (ref <5)

## 2018-10-12 LAB — CBG MONITORING, ED: Glucose-Capillary: 92 mg/dL (ref 70–99)

## 2018-10-12 LAB — TROPONIN I (HIGH SENSITIVITY): Troponin I (High Sensitivity): 4 ng/L (ref <18)

## 2018-10-12 MED ORDER — SODIUM CHLORIDE 0.9% FLUSH
3.0000 mL | Freq: Once | INTRAVENOUS | Status: AC
  Administered 2018-10-12: 15:00:00 3 mL via INTRAVENOUS

## 2018-10-12 MED ORDER — IOHEXOL 350 MG/ML SOLN
100.0000 mL | Freq: Once | INTRAVENOUS | Status: AC | PRN
  Administered 2018-10-12: 18:00:00 100 mL via INTRAVENOUS

## 2018-10-12 MED ORDER — HEPARIN SOD (PORK) LOCK FLUSH 100 UNIT/ML IV SOLN
500.0000 [IU] | Freq: Once | INTRAVENOUS | Status: DC
  Filled 2018-10-12: qty 5

## 2018-10-12 MED ORDER — HEPARIN SOD (PORK) LOCK FLUSH 100 UNIT/ML IV SOLN
500.0000 [IU] | INTRAVENOUS | Status: AC | PRN
  Administered 2018-10-12: 500 [IU]
  Filled 2018-10-12: qty 5

## 2018-10-12 NOTE — ED Notes (Signed)
[name] Language Interpreter.

## 2018-10-12 NOTE — ED Provider Notes (Signed)
Patient in no distress, awake, alert. I discussed the findings with the assistance of a sign language interpreter. With reassuring findings, patient discharged in stable condition.   Carmin Muskrat, MD 10/12/18 2026

## 2018-10-12 NOTE — Discharge Instructions (Signed)
As discussed, your evaluation today has been largely reassuring.  But, it is important that you monitor your condition carefully, and do not hesitate to return to the ED if you develop new, or concerning changes in your condition. ? ?Otherwise, please follow-up with your physician for appropriate ongoing care. ? ?

## 2018-10-12 NOTE — ED Provider Notes (Signed)
[facility] EMERGENCY DEPARTMENT Provider Note   CSN: [id number] Arrival date & time: 10/12/18  1237     History   Chief Complaint Chief Complaint  Patient presents with  . Chest Pain    HPI [name] is a 53 y.o. female.  She has a history of metastatic breast cancer and is on chemotherapy and follows with Dr. Marya Landry.  She said 4 days ago she was moving something and had acute pain in her anterior chest that is been there steady since then.  She says maybe she is a little short of breath.  No cough no fever no nausea vomiting or diaphoresis.  She has not tried anything for it.  No prior cardiac disease.  She has not contacted her oncologist regarding this.  As far she knows she does not have any cancer in the bones of her chest.     The history is provided by the patient.  Chest Pain Pain location:  Substernal area Pain quality: aching   Pain radiates to:  Does not radiate Pain severity:  Moderate Onset quality:  Sudden Duration:  4 days Timing:  Constant Progression:  Unchanged Chronicity:  New Context: lifting   Relieved by:  Nothing Worsened by:  Movement and deep breathing Ineffective treatments:  None tried Associated symptoms: shortness of breath   Associated symptoms: no abdominal pain, no back pain, no cough, no diaphoresis, no fever, no headache, no nausea, no syncope and no vomiting   Risk factors: no coronary artery disease and no prior DVT/PE     Past Medical History:  Diagnosis Date  . Abnormal Pap smear   . Anxiety   . Breast cancer ([place])   . Cancer ([place]) 1999   breast-s/p mastectomy, chemo, rad  . CIN I (cervical intraepithelial neoplasia I) 2003   by colpo  . Congenital deafness   . Deaf   . Depression   . Port-A-Cath in place 2017   for chemotherapy  . Radiation 07/12/15-07/26/15   left femur 30 Gy  . S/P bilateral oophorectomy     Patient Active Problem List   Diagnosis Date Noted  . Chest pain 12/18/2017  .  Atypical angina ([place]) 12/13/2017  . Abnormal stress echocardiogram 12/08/2017  . Bilateral thigh pain 11/19/2016  . Goals of care, counseling/discussion 03/05/2016  . MRSA (methicillin resistant Staphylococcus aureus) infection 01/18/2016  . Anemia due to antineoplastic chemotherapy 12/11/2015  . Lymphedema of left leg 12/11/2015  . Osteopenia of multiple sites 11/26/2015  . Chemotherapy-induced peripheral neuropathy ([place]) 11/03/2015  . Port catheter in place 09/26/2015  . Chemotherapy induced nausea and vomiting 08/13/2015  . Cancer related pain 08/13/2015  . [name], right ([place]) 07/14/2015  . S/P TRAM (transverse rectus abdominis muscle) flap breast reconstruction 07/14/2015  . Thyroid nodule 07/09/2015  . Depression   . Generalized anxiety disorder   . Esophageal reflux   . Genetic testing 04/04/2015  . Congenital deafness 02/26/2015  . Premature surgical menopause 02/26/2015  . Benign paroxysmal positional vertigo 07/24/2014  . Migraine 07/24/2014  . DIZZINESS, CHRONIC 01/25/2007  . Insomnia 01/25/2007    Past Surgical History:  Procedure Laterality Date  . ABDOMINAL HYSTERECTOMY    . INTRAMEDULLARY (IM) NAIL INTERTROCHANTERIC Left 06/26/2015   Procedure: LEFT BIOMET LONG AFFIXS NAIL;  Surgeon: [name], MD;  Location: [facility];  Service: Orthopedics;  Laterality: Left;  . IR GENERIC HISTORICAL  08/06/2015   IR US GUIDE VASC ACCESS LEFT  08/06/2015 Aletta Edouard, MD [facility]  . IR GENERIC HISTORICAL  08/06/2015   IR FLUORO GUIDE CV LINE LEFT 08/06/2015 Aletta Edouard, MD [facility]  . IR GENERIC HISTORICAL  03/05/2016   IR CV LINE INJECTION 03/05/2016 Markus Daft, MD [facility]  . LEFT HEART CATH AND CORONARY ANGIOGRAPHY N/A 12/13/2017   Procedure: LEFT HEART CATH AND CORONARY ANGIOGRAPHY;  Surgeon: Leonie Man, MD;  Location: [facility];  Service: Cardiovascular;  Laterality: N/A;  . MASTECTOMY     right breast  . MASTECTOMY     . OVARIAN CYST REMOVAL    . RADIOLOGY WITH ANESTHESIA Left 06/25/2015   Procedure: MRI OF LEFT HIP WITH OR WITHOUT CONTRAST;  Surgeon: Medication Radiologist, MD;  Location: [facility];  Service: Radiology;  Laterality: Left;  DR. MCINTYRE/MRI  . TUBAL LIGATION       OB History    Gravida  2   Para  2   Term  1   Preterm  1   AB  0   Living  2     SAB  0   TAB  0   Ectopic  0   Multiple  0   Live Births               Home Medications    Prior to Admission medications   Medication Sig Start Date End Date Taking? Authorizing Provider  albuterol (PROVENTIL HFA;VENTOLIN HFA) 108 (90 Base) MCG/ACT inhaler Inhale 2 puffs into the lungs every 6 (six) hours as needed for wheezing or shortness of breath. 02/11/18   Truitt Merle, MD  ALPRAZolam Duanne Moron) 0.25 MG tablet Take 1 tablet (0.25 mg total) by mouth 2 (two) times daily as needed for anxiety. 07/08/18   Truitt Merle, MD  dexamethasone (DECADRON) 4 MG tablet Take 1 tablet (4 mg total) by mouth daily. 05/27/18   Truitt Merle, MD  diphenoxylate-atropine (LOMOTIL) 2.5-0.025 MG tablet Take 1-2 tablets by mouth 4 (four) times daily as needed for diarrhea or loose stools. 02/11/18   Truitt Merle, MD  lidocaine-prilocaine (EMLA) cream Apply 1 application topically as needed. Apply to Porta-Cath 1-2 hours prior to access as directed. 08/26/18   Truitt Merle, MD  magnesium oxide (MAG-OX) 400 (241.3 Mg) MG tablet Take 1 tablet (400 mg total) by mouth daily. 10/01/18   Truitt Merle, MD  methocarbamol (ROBAXIN) 500 MG tablet Take 1 tablet (500 mg total) by mouth 2 (two) times daily. 05/24/18   Joy, Shawn C, [name]  omeprazole (PRILOSEC) 20 MG capsule Take 1 capsule (20 mg total) by mouth daily. 04/01/18   Truitt Merle, MD  ondansetron (ZOFRAN) 8 MG tablet Take 1 tablet (8 mg total) by mouth every 8 (eight) hours as needed for nausea or vomiting. 05/24/18   Alla Feeling, NP  oxyCODONE (ROXICODONE) 5 MG immediate release tablet Take 1 tablet (5 mg total) by mouth  every 8 (eight) hours as needed for severe pain. 09/09/18   Truitt Merle, MD  potassium chloride (K-DUR) 10 MEQ tablet Take 2 tablets (20 mEq total) by mouth 2 (two) times daily. 04/01/18   Truitt Merle, MD  prochlorperazine (COMPAZINE) 10 MG tablet Take 1 tablet (10 mg total) by mouth every 6 (six) hours as needed for nausea or vomiting. 02/25/18   Truitt Merle, MD  zolpidem (AMBIEN) 5 MG tablet Take 1.5 tablets (7.5 mg total) by mouth at bedtime as needed for sleep. 10/07/18   Truitt Merle, MD    Family History  Family History  Problem Relation Age of Onset  . Hypertension Mother   . Seizures Mother   . Alzheimer's disease Maternal Uncle   . Pancreatitis Maternal Grandmother   . Heart attack Maternal Grandfather     Social History Social History   Tobacco Use  . Smoking status: Never Smoker  . Smokeless tobacco: Never Used  Substance Use Topics  . Alcohol use: No  . Drug use: No     Allergies   Chlorhexidine, Promethazine hcl, and Diphenhydramine hcl   Review of Systems Review of Systems  Constitutional: Negative for diaphoresis and fever.  HENT: Negative for sore throat.   Eyes: Negative for visual disturbance.  Respiratory: Positive for shortness of breath. Negative for cough.   Cardiovascular: Positive for chest pain. Negative for syncope.  Gastrointestinal: Negative for abdominal pain, nausea and vomiting.  Genitourinary: Negative for dysuria.  Musculoskeletal: Negative for back pain.  Skin: Negative for rash.  Neurological: Negative for headaches.     Physical Exam Updated Vital Signs BP 123/82   Pulse 99   Resp 17   Ht 5\' 5"  (1.651 m)   Wt 60.4 kg   SpO2 100%   BMI 22.17 kg/m   Physical Exam Vitals signs and nursing note reviewed.  Constitutional:      General: She is not in acute distress.    Appearance: She is well-developed.  HENT:     Head: Normocephalic and atraumatic.  Eyes:     Conjunctiva/sclera: Conjunctivae normal.  Neck:     Musculoskeletal: Neck  supple.  Cardiovascular:     Rate and Rhythm: Normal rate and regular rhythm.     Heart sounds: Normal heart sounds. No murmur.  Pulmonary:     Effort: Pulmonary effort is normal. No respiratory distress.     Breath sounds: Normal breath sounds.     Comments: Port in left upper chest, no surrounding erythema or tenderness. Chest:     Chest wall: Tenderness present. No crepitus.  Abdominal:     Palpations: Abdomen is soft.     Tenderness: There is no abdominal tenderness.  Musculoskeletal: Normal range of motion.     Right lower leg: She exhibits no tenderness. No edema.     Left lower leg: She exhibits no tenderness. No edema.  Skin:    General: Skin is warm and dry.     Capillary Refill: Capillary refill takes less than 2 seconds.  Neurological:     General: No focal deficit present.     Mental Status: She is alert.      ED Treatments / Results  Labs (all labs ordered are listed, but only abnormal results are displayed) Labs Reviewed  BASIC METABOLIC PANEL - Abnormal; Notable for the following components:      Result Value   Calcium 8.7 (*)    All other components within normal limits  CBC - Abnormal; Notable for the following components:   WBC 2.5 (*)    Platelets 149 (*)    All other components within normal limits  I-STAT BETA HCG BLOOD, ED ([facility], [facility], [facility] ONLY)  CBG MONITORING, ED  TROPONIN I (HIGH SENSITIVITY)  TROPONIN I (HIGH SENSITIVITY)    EKG EKG Interpretation  Date/Time:  Wednesday October 12 2018 12:43:52 EDT Ventricular Rate:  97 PR Interval:  132 QRS Duration: 80 QT Interval:  336 QTC Calculation: 426 R Axis:   40 Text Interpretation:  Normal sinus rhythm Right atrial enlargement T wave abnormality, consider inferolateral ischemia Abnormal ECG  new ischemic changes compared with prior 12/19 Confirmed by Aletta Edouard [phone]) on 10/12/2018 1:13:38 PM   Radiology Dg Chest 2 View  Result Date: 10/12/2018 CLINICAL DATA:  Chest pain EXAM: CHEST - 2  VIEW COMPARISON:  06/25/2015 FINDINGS: Left-sided chest port terminates within the distal SVC. The heart size and mediastinal contours are within normal limits. Both lungs are clear. No acute osseous findings. Multiple surgical clips within the right axilla and right breast regions IMPRESSION: No active cardiopulmonary disease. Electronically Signed   By: Davina Poke M.D.   On: 10/12/2018 13:12    Procedures Procedures (including critical care time)  Medications Ordered in ED Medications  sodium chloride flush (NS) 0.9 % injection 3 mL (has no administration in time range)     Initial Impression / Assessment and Plan / ED Course  I have reviewed the triage vital signs and the nursing notes.  Pertinent labs & imaging results that were available during my care of the patient were reviewed by me and considered in my medical decision making (see chart for details).  Clinical Course as of Oct 11 1916  Wed Oct 12, 2018  1324 Patient has been roomed.  She is deaf and we are awaiting the sign language interpreter to arrive.  She appears in no distress currently.   [MB]  1333 In writing I asked the patient is comfortable waiting for the sign language interpreter and she said she is fine to wait.   [MB]  [id number] 53 year old female with history of metastatic breast cancer here with 4 days of chest pain possibly chest wall pain.  Unfortunately she is getting EKG did show some new T wave inversions.  Getting troponin along with basic lab work.  Also with a history of cancer need to consider PE although sats are 100% on room air.   [MB]  [id number] I updated the patient on her current work-up including a normal troponin in the setting of 4 days of pain.  I still put her in for the CAT scan and she understands that she is waiting on that next.  I think if her CT is negative she can be discharged on some pain medication which for what likely is chest wall pain.   [MB]    Clinical Course User Index [MB]  Hayden Rasmussen, MD        Final Clinical Impressions(s) / ED Diagnoses   Final diagnoses:  None    ED Discharge Orders    None       Hayden Rasmussen, MD 10/12/18 [phone]

## 2018-10-12 NOTE — ED Triage Notes (Signed)
Chest pain x 4 days--- Cancer pt-- has port-a-cath-- left chest --   PT IS DEAF!!!

## 2018-10-12 NOTE — ED Notes (Signed)
Results from CTA is not showing up in system.  Called CT who will print results and bring them over for provider to review

## 2018-10-12 NOTE — ED Notes (Signed)
Photographer Science writer) called @ 1250-per Santiago Glad, RN called by Levada Dy

## 2018-10-12 NOTE — ED Notes (Signed)
Port de-accessed per protocol.  Pt tolerated this well.  D/c instruction with american sign language interpreter that is at the bedside

## 2018-10-19 NOTE — Progress Notes (Signed)
[place]   [phone] [phone]   Clinic Follow up Note   Patient Care Team: [name], [name], [name] as PCP - General (Physician Assistant) [name], MD as Consulting Physician (Orthopedic Surgery)  Date of Service:  10/21/2018  CHIEF COMPLAINT: F/u ofmetastatic breast cancer  SUMMARY OF ONCOLOGIC HISTORY: Oncology History Overview Note   Cancer Staging No matching staging information was found for the patient.     [name], right ([place])  11/1997 Cancer Diagnosis   Patient had 1.4 cm poorly differentiated right breast carcinoma diagnosed in Nov 1999 at age 83, 1/7 axillary nodes involved, ER/PR and HER 2 positive. She had mastectomy with the 7 axillary node evaluation, 4 cycles of adriamycin/ cytoxan followed by taxotere, then five years of tamoxifen thru June 2005 (no herceptin in 1999). She had bilateral oophorectomy in May 2005. She was briefly on aromatase inhibitor after tamoxifen, but discontinued this herself due to poor tolerance.   04/04/2015 Genetic Testing   Negative genetic testing on the breast/ovarian cancer pnael.  The Breast/Ovarian gene panel offered by [facility] includes sequencing and rearrangement analysis for the following 20 genes:  ATM, BARD1, BRCA1, BRCA2, BRIP1, CDH1, CHEK2, EPCAM, FANCC, MLH1, MSH2, MSH6, NBN, PALB2, PMS2, PTEN, RAD51C, RAD51D, TP53, and XRCC2.   06/26/2015 Pathology Results   Initial Path Report Bone, curettage, Left femur met, intramedullary subtroch tissue - METASTATIC ADENOCARCINOMA.  Estrogen Receptor: 95%, POSITIVE, STRONG STAINING INTENSITY Progesterone Receptor: 2%, POSITIVE, STRONG  HER2 (+), IHC 3+   06/26/2015 Surgery   Biopsy of metastatic tissue and stabilization with Affixus trochanteric nail, proximal and distal interlock of left femur by Dr. [name]    06/28/2015 Imaging   CT Chest w/ Contrast 1. Extensive right internal mammary chain  lymphadenopathy consistent with metastatic breast cancer. 2. Lytic metastases involving the posterior elements at T1 with probable nondisplaced pathologic fracture of the spinous process. 3. No other evidence of thoracic metastatic disease. 4. Trace bilateral pleural effusions with associated bibasilar atelectasis. 5. Indeterminate right thyroid nodule, not previously imaged. This could be evaluated with thyroid ultrasound as clinically warranted.   07/01/2015 Imaging   Bone Scan 1. Increased activity noted throughout the left femur. Although metastatic disease cannot be excluded, these changes are most likely secondary to prior surgery. 2. No other focal abnormalities identified to suggest metastatic disease.   07/12/2015 - 07/26/2015 Radiation Therapy   Left femur, 30 Gy in 10 fractions by Dr. Sondra Come    07/14/2015 Initial Diagnosis   [name], right ([place])   07/19/2015 Imaging   Initial PET Scan 1. Severe multifocal osseous metastatic disease, much greater than anticipated based on prior imaging. 2. Multifocal nodal metastases involving the right internal mammary, prevascular and subpectoral lymph nodes. No axillary pulmonary involvement identified. 3. No distant extra osseous metastases.   07/30/2015 - 11/29/2015 Chemotherapy   Docetaxel, Herceptin and Perjeta every 3 weeks X6 cycles, pt tolerated moderately well, restaging scan showed excellent response    09/18/2015 Imaging   CT Head w/wo Contrast 1. No acute intracranial abnormality or significant interval change. 2. Stable minimal periventricular white matter hypoattenuation on the right. This may reflect a remote ischemic injury. 3. No evidence for metastatic disease to the brain. 4. No focal soft tissue lesion to explain the patient's tenderness.   10/30/2015 Imaging   CT Abdomen Pelvis w/ Contrast 1. Few mildly prominent fluid-filled loops of small bowel scattered throughout the abdomen, with  intraluminal fluid density within the distal colon. Given the provided history, findings are suggestive of acute enteritis/diarrheal illness. 2. No other acute intra-abdominal or pelvic process identified. 3. Widespread osseous metastatic disease, better evaluated on most recent PET-CT from 07/19/2015. No pathologic fracture or other complication. 4. 11 mm hypodensity within the left kidney. This lesion measures intermediate density, and is indeterminate. While this lesion is similar in size relative to recent studies, this is increased in size relative to prior study from 2012. Further evaluation with dedicated renal mass protocol CT and/or MRI is recommended for complete characterization.   12/2015 - 05/14/2016 Chemotherapy   Maintenance Herceptin and pejeta every 3 weeks stopped on 05/14/16 due to disease progression   12/25/2015 Imaging   Restaging PET Scan 1. Markedly improved skeletal activity, with only several faint foci of residual accentuated metabolic activity, but resolution of the vast majority of the previously extensive osseous metastatic disease. 2. Resolution of the prior right internal mammary and right prevascular lymph nodes. 3. Residual subcutaneous edema and edema along fascia planes in the left upper thigh. This remains somewhat more than I would expect for placement of an IM nail 6 weeks ago. If there is leg swelling further down, consider Doppler venous ultrasound to rule out left lower extremity DVT. I do not perceive an obvious difference in density between the pelvic and common femoral veins on the noncontrast CT data. 4. Chronic right maxillary and right sphenoid sinusitis.   01/16/2016 - 10/20/2017 Anti-estrogen oral therapy   Letrozole 2.5 mg daily. She was switched to exemestane due to disease progression on 02/08/17. Stopped on 10/20/2017 when treatment changed to chemo    03/01/2016 Miscellaneous   Patient presented to ED following a fall; she reports she landed on her  back and is now experiencing back pain. The patient was evaluated and discharge home same day with pain control.   05/01/2016 Imaging   CT CAP IMPRESSION: 1. Stable exam.  No new or progressive disease identified. 2. No evidence for residual or recurrent adenopathy within the chest. 3. Stable bone metastasis.   05/01/2016 Imaging   BONE SCAN IMPRESSION: 1. Small focal uptake involving the anterior rib ends of the left second and right fourth ribs, new since the prior bone scan. The location of this uptake is more suggestive of a traumatic or inflammatory etiology as opposed to metastatic disease. No other evidence of metastatic disease. 2. There are other areas of stable uptake which are most likely degenerative/reactive, including right shoulder and cervical spine uptake and uptake along the right femur adjacent to the ORIF hardware.   05/20/2016 Imaging   NM PET Skull to Thigh IMPRESSION: 1. There multiple metastatic foci in the bony pelvis, including some new abnormal foci compare to the prior PET-CT, compatible with mildly progressive bony metastatic disease. Also, a left T11 vertebral hypermetabolic metastatic lesion is observed, new compared to the prior exam. 2. No active extraosseous malignancy is currently identified. 3. Right anterior fourth rib healing fracture, appears be   06/04/2016 - 01/08/2017 Chemotherapy   Second line chemotherapy Kadcyla every 3 weeks started on 06/04/16 and stopped on 01/08/17 due to mixed response.     10/05/2016 Imaging   CT CAP and Bone Scan:  Bones: left intramedullary rod and left femoral head neck screw in place. In the proximal diaphysis of the left femur there is cortical thickening and irregularity. No lytic or blastic bone lesions. No fracture or vertebral endplate destruction.   No definite evidence of recurrent  disease in the chest, abdomen, or pelvis.   10/28/2016 Imaging   CT A/P IMPRESSION: No acute process demonstrated in  the abdomen or pelvis. No evidence of bowel obstruction or inflammation.   11/04/2016 Imaging   DG foot complete left: IMPRESSION: No fracture or dislocation.  No soft tissue abnormality   11/23/2016 Imaging   CT RIGHT FEMUR IMPRESSION: Negative CT scan of the right thigh.  No visible metastatic disease.  CT LEFT FEMUR IMPRESSION: 1. Postsurgical changes related to prior cephalomedullary rod fixation of the left femur with linear lucency along the anterolateral proximal femoral cortex at level of the lesser trochanter, suspicious for nondisplaced fracture. 2. Slightly permeative appearance of the proximal left femoral cortex at the site of known prior osseous metastases is largely unchanged. 3. Unchanged patchy lucency and sclerosis along the anterior acetabulum, which appears to correspond with an area of faint uptake on the prior PET-CT, suspicious for osseous metastasis. These results will be called to the ordering clinician or representative by the Radiologist Assistant, and communication documented in the PACS or zVision Dashboard.     01/27/2017 Imaging   IMPRESSION: 1. Mixed response to osseous metastasis. Some hypermetabolic lesions are new and increasingly hypermetabolic, while another index lesion is decreasingly hypermetabolic. 2. No evidence of hypermetabolic soft tissue metastasis.    02/08/2017 - 10/19/2017 Antibody Plan   -Herceptin every 3 week since 02/08/17 -Oral Lanpantinib 1548m and decreased to 5027mdue to diarrhea and depression. Due to disease progression she swithced to Verzenio 15032mn 05/04/17. She did not toelrate well so she was switched to Ibrance 100m35m 05/31/17. Due to her neutropenia, Ibrance dose reduced to 75 mg daily, 3 weeks on, one-week off, stopped on 10/20/2017 due to disease progression.    04/30/2017 PET scan   IMPRESSION: 1. Primarily increase in hypermetabolism of osseous metastasis. An isolated right acetabular lesion is  decreased in hypermetabolism since the prior exam. 2. No evidence of hypermetabolic soft tissue metastasis. 3. Similar non FDG avid right-sided thyroid nodule, favoring a benign etiology. Recommend attention on follow-up.   08/30/2017 PET scan   08/30/2017 PET Scan  IMPRESSION: 1. Worsening osseous metastatic disease. 2. Focal hypermetabolism in the central spinal canal the L1 level, similar to the prior exam. Metastatic implant cannot be excluded. 3. Slight enlargement of a right thyroid nodule which now appears more cystic in character. Consider further evaluation with thyroid ultrasound. If patient is clinically hyperthyroid, consider nuclear medicine thyroid uptake and scan   08/30/2017 Progression   08/30/2017 PET Scan  IMPRESSION: 1. Worsening osseous metastatic disease. 2. Focal hypermetabolism in the central spinal canal the L1 level, similar to the prior exam. Metastatic implant cannot be excluded. 3. Slight enlargement of a right thyroid nodule which now appears more cystic in character. Consider further evaluation with thyroid ultrasound. If patient is clinically hyperthyroid, consider nuclear medicine thyroid uptake and scan   09/29/2017 - 10/11/2017 Radiation Therapy   Pt received 27 Gy in 9 fractions directed at the areas of significant uptake noted on recent bone scan the right pelvis and right proximal femur, with Dr, KinaSondra Come10/09/2017 - 04/07/2018 Chemotherapy   -Herceptin every 3 weeks starting 02/08/17, perjeta added on 10/20/2017  -weekly Taxol started on 10/21/2017. Will reduce Taxol to 2 weeks on, 1 week off starting on 02/04/17.  Stoped due to mild disease progression   10/27/2017 Imaging   10/27/2017 CT AP IMPRESSION: 1. No acute abnormality. No evidence of bowel obstruction or acute bowel  inflammation. Normal appendix. 2. Stable patchy sclerotic osseous metastases throughout the visualized skeleton. No new or progressive metastatic disease in the abdomen  or pelvis.   01/11/2018 Imaging   Whole Body Bone Scan 01/11/18  IMPRESSION: 1. Multifocal osseous metastases as described. 2. Right scapular metastasis. 3. Right superior thoracic spinous process. 4. Right sacral ala and right L5 metastases. 5. Right acetabular metastasis. 6. Right proximal femur metastasis. 7. Diffuse metastases throughout the left femur. 8. Anterior right fourth rib metastasis.   01/11/2018 Imaging   CT CAP W Contrast 01/11/18  IMPRESSION: 1. Similar-appearing osseous metastatic disease. 2. No evidence for progressed metastatic disease in the chest, abdomen or pelvis.   03/30/2018 Imaging   Bone Scan 03/30/18  IMPRESSION: Stable multifocal bony metastatic disease.   03/30/2018 Imaging    CT CAP W Contrast 03/30/18 IMPRESSION: 1. New mild contiguous wall thickening with associated mucosal hyperenhancement throughout the left colon and rectum, suggesting nonspecific infectious or inflammatory proctocolitis. Differential includes C diff colitis. 2. Stable faint patchy sclerosis in the iliac bones and lumbar vertebral bodies. No new or progressive osseous metastatic disease by CT. Please note that the osseous lesions are relatively occult by CT and would be better evaluated by PET-CT, as clinically warranted. 3. No evidence of extra osseous metastatic disease.  These results will be called to the ordering clinician or representative by the Radiologist Assistant, and communication documented in the PACS or zVision Dashboard.   04/15/2018 - 06/16/2018 Chemotherapy   Enhertu q3weeks starting 04/15/18. Stopped Enhertu after cycle 3 on 05/27/18 due to poor toleration.    04/21/2018 PET scan   PET 04/21/18  IMPRESSION: 1. Overall marked interval improvement in bony hypermetabolic disease. All of the previously identified hypermetabolic osseous metastases have decreased in hypermetabolism in the interval. There is a new tiny focus of hypermetabolism in the right aspect  of the L3 vertebral body that appears to correspond to a new 8 mm lucency, raising concern for new metastatic disease. Close attention on follow-up recommended. 2. Focal hypermetabolism in the central spinal canal at the level of L1 previously has resolved. 3. No hypermetabolic soft tissue disease on today's study. 4. Right thyroid nodule seen previously has decreased in the interval.    06/16/2018 Imaging   CT LUMBAR SPINE WO CONTRAST 06/16/18 IMPRESSION: 1. Transitional lumbosacral anatomy. Correlation with radiographs is recommended prior to any operative intervention. 2. Suspicion of a right L3 vertebral body metastasis on the April PET-CT is not evident by CT. Stable degenerative versus metastatic lesion at the right L5 superior endplate. Small but increased right iliac bone metastasis since March. Lumbar MRI would best characterize lumbar metastatic disease (without contrast if necessary). 3. Suspect L2-L3 left subarticular segment disc herniation. Query left L2 radiculitis. 4. Stable appearance of rightward L4-L5 disc degeneration which could affect the right foramen and right lateral recess.   06/17/2018 -  Chemotherapy   Restart Taxol 2 weeks on/1 week off with Herceptin and Perjeta q3weeks on 06/17/18   07/06/2018 PET scan   PET  IMPRESSION: 1. Near total resolution of hypermetabolic activity associated with skeletal metastasis. Very mild activity associated with the LEFT iliac bone which is similar to background blood pool activity. 2. No new or progressive disease.      CURRENT THERAPY:  Restart Taxol 2 weeks on/1 week off with Herceptin and Perjeta q3weeks on 06/17/18. Dose reduce Perjeta with cycle 5 due to diarrhea.Perjeta stopped after 07/29/18 due to persistent diarrhea  INTERVAL HISTORY:  [name]  is here for a follow up and ongoing treatment. She presents to the clinic with her interpretor. She notes she went to ED last week for chest pain. She was  having this for 4 days. It was observed this was muscular strain related. Since then her chest pain is intermittent. Her ED physician said this strain can go on for 6-8 weeks.  She notes she did lift a chair at her home so this could be related. Her pain is currently manageable. She notes she has been out of Oxycodone and had trouble getting it refilled. She notes she has been taking 3 potassium a day.    REVIEW OF SYSTEMS:   Constitutional: Denies fevers, chills or abnormal weight loss Eyes: Denies blurriness of vision Ears, nose, mouth, throat, and face: Denies mucositis or sore throat Respiratory: Denies cough, dyspnea or wheezes Cardiovascular: Denies palpitation, chest discomfort or lower extremity swelling Gastrointestinal:  Denies nausea, heartburn or change in bowel habits Skin: Denies abnormal skin rashes MSK: (+) Muscular chest pain intermittent (+) Intermittent b/l hip pain  Lymphatics: Denies new lymphadenopathy or easy bruising Neurological: (+) Stable Neuropathy mainly in feet  Behavioral/Psych: Mood is stable, no new changes  All other systems were reviewed with the patient and are negative.  MEDICAL HISTORY:  Past Medical History:  Diagnosis Date  . Abnormal Pap smear   . Anxiety   . Breast cancer ([place])   . Cancer ([place]) 1999   breast-s/p mastectomy, chemo, rad  . CIN I (cervical intraepithelial neoplasia I) 2003   by colpo  . Congenital deafness   . Deaf   . Depression   . Port-A-Cath in place 2017   for chemotherapy  . Radiation 07/12/15-07/26/15   left femur 30 Gy  . S/P bilateral oophorectomy     SURGICAL HISTORY: Past Surgical History:  Procedure Laterality Date  . ABDOMINAL HYSTERECTOMY    . INTRAMEDULLARY (IM) NAIL INTERTROCHANTERIC Left 06/26/2015   Procedure: LEFT BIOMET LONG AFFIXS NAIL;  Surgeon: [name], MD;  Location: [facility];  Service: Orthopedics;  Laterality: Left;  . IR GENERIC HISTORICAL  08/06/2015   IR US GUIDE VASC ACCESS LEFT 08/06/2015  Aletta Edouard, MD [facility]  . IR GENERIC HISTORICAL  08/06/2015   IR FLUORO GUIDE CV LINE LEFT 08/06/2015 Aletta Edouard, MD [facility]  . IR GENERIC HISTORICAL  03/05/2016   IR CV LINE INJECTION 03/05/2016 Markus Daft, MD [facility]  . LEFT HEART CATH AND CORONARY ANGIOGRAPHY N/A 12/13/2017   Procedure: LEFT HEART CATH AND CORONARY ANGIOGRAPHY;  Surgeon: Leonie Man, MD;  Location: [facility];  Service: Cardiovascular;  Laterality: N/A;  . MASTECTOMY     right breast  . MASTECTOMY    . OVARIAN CYST REMOVAL    . RADIOLOGY WITH ANESTHESIA Left 06/25/2015   Procedure: MRI OF LEFT HIP WITH OR WITHOUT CONTRAST;  Surgeon: Medication Radiologist, MD;  Location: [facility];  Service: Radiology;  Laterality: Left;  DR. MCINTYRE/MRI  . TUBAL LIGATION      I have reviewed the social history and family history with the patient and they are unchanged from previous note.  ALLERGIES:  is allergic to promethazine hcl; chlorhexidine; and diphenhydramine hcl.  MEDICATIONS:  Current Outpatient Medications  Medication Sig Dispense Refill  . albuterol (PROAIR HFA) 108 (90 Base) MCG/ACT inhaler Inhale 2 puffs into the lungs every 6 (six) hours as needed for wheezing or shortness of breath.    . ALPRAZolam (XANAX) 0.25 MG tablet Take  1 tablet (0.25 mg total) by mouth 2 (two) times daily as needed for anxiety. 30 tablet 0  . lidocaine-prilocaine (EMLA) cream Apply 1 application topically as needed. Apply to Porta-Cath 1-2 hours prior to access as directed. (Patient taking differently: Apply 1 application topically as needed (To Porta-Cath 1-2 hours prior to access as directed). ) 90 g 2  . methocarbamol (ROBAXIN) 500 MG tablet Take 1 tablet (500 mg total) by mouth 2 (two) times daily. (Patient taking differently: Take 500 mg by mouth 2 (two) times daily as needed for muscle spasms. ) 20 tablet 0  . ondansetron (ZOFRAN) 8 MG tablet Take 1 tablet (8 mg total) by mouth every 8 (eight) hours as needed  for nausea or vomiting. 45 tablet 1  . oxyCODONE (ROXICODONE) 5 MG immediate release tablet Take 1 tablet (5 mg total) by mouth every 8 (eight) hours as needed for severe pain. 60 tablet 0  . potassium chloride (K-DUR) 10 MEQ tablet Take 2 tablets (20 mEq total) by mouth 2 (two) times daily. (Patient taking differently: Take 30 mEq by mouth daily. ) 180 tablet 3  . prochlorperazine (COMPAZINE) 10 MG tablet Take 1 tablet (10 mg total) by mouth every 6 (six) hours as needed for nausea or vomiting. 60 tablet 2  . zolpidem (AMBIEN) 5 MG tablet Take 1.5 tablets (7.5 mg total) by mouth at bedtime as needed for sleep. (Patient taking differently: Take 5 mg by mouth at bedtime. ) 45 tablet 0  . albuterol (PROVENTIL HFA;VENTOLIN HFA) 108 (90 Base) MCG/ACT inhaler Inhale 2 puffs into the lungs every 6 (six) hours as needed for wheezing or shortness of breath. (Patient not taking: Reported on 10/12/2018) 1 Inhaler 0  . dexamethasone (DECADRON) 4 MG tablet Take 1 tablet (4 mg total) by mouth daily. (Patient not taking: Reported on 10/12/2018) 20 tablet 0  . diphenoxylate-atropine (LOMOTIL) 2.5-0.025 MG tablet Take 1-2 tablets by mouth 4 (four) times daily as needed for diarrhea or loose stools. (Patient not taking: Reported on 10/12/2018) 30 tablet 1  . magnesium oxide (MAG-OX) 400 (241.3 Mg) MG tablet Take 1 tablet (400 mg total) by mouth daily. 30 tablet 3  . omeprazole (PRILOSEC) 20 MG capsule Take 1 capsule (20 mg total) by mouth daily. (Patient not taking: Reported on 10/12/2018) 30 capsule 0   No current facility-administered medications for this visit.    Facility-Administered Medications Ordered in Other Visits  Medication Dose Route Frequency Provider Last Rate Last Dose  . heparin lock flush 100 unit/mL  500 Units Intracatheter Once PRN Truitt Merle, MD      . PACLitaxel (TAXOL) 96 mg in sodium chloride 0.9 % 250 mL chemo infusion (</= 59m/m2)  60 mg/m2 (Treatment Plan Recorded) Intravenous Once FTruitt Merle  MD      . potassium chloride 10 mEq in 100 mL IVPB  10 mEq Intravenous Q1 Hr x 2 FTruitt Merle MD 100 mL/hr at 10/21/18 1001 10 mEq at 10/21/18 1001  . potassium chloride SA (K-DUR) CR tablet 40 mEq  40 mEq Oral Once FTruitt Merle MD      . sodium chloride 0.9 % 1,000 mL with potassium chloride 10 mEq infusion   Intravenous Continuous LGordy Levan MD   Stopped at 09/10/15 1653  . sodium chloride 0.9 % injection 10 mL  10 mL Intravenous PRN Livesay, Lennis P, MD      . sodium chloride flush (NS) 0.9 % injection 10 mL  10 mL Intracatheter PRN FTruitt Merle MD      .  trastuzumab-dkst (OGIVRI) 357 mg in sodium chloride 0.9 % 250 mL chemo infusion  6 mg/kg (Treatment Plan Recorded) Intravenous Once Truitt Merle, MD        PHYSICAL EXAMINATION: ECOG PERFORMANCE STATUS: 1 - Symptomatic but completely ambulatory  Vitals:   10/21/18 0852  BP: 124/73  Pulse: 65  Resp: 18  Temp: 98.7 F (37.1 C)  SpO2: 99%   Filed Weights   10/21/18 0852  Weight: 134 lb 3.2 oz (60.9 kg)    GENERAL:alert, no distress and comfortable SKIN: skin color, texture, turgor are normal, no rashes or significant lesions EYES: normal, Conjunctiva are pink and non-injected, sclera clear  NECK: supple, thyroid normal size, non-tender, without nodularity LYMPH:  no palpable lymphadenopathy in the cervical, axillary  LUNGS: clear to auscultation and percussion with normal breathing effort HEART: regular rate & rhythm and no murmurs and no lower extremity edema ABDOMEN:abdomen soft, non-tender and normal bowel sounds Musculoskeletal:no cyanosis of digits and no clubbing  NEURO: alert & oriented x 3 with fluent speech, Slight sensory deficits in hands and more in her feet  LABORATORY DATA:  I have reviewed the data as listed CBC Latest Ref Rng & Units 10/21/2018 10/12/2018 10/07/2018  WBC 4.0 - 10.5 K/uL 3.4(L) 2.5(L) 4.2  Hemoglobin 12.0 - 15.0 g/dL 10.7(L) 13.1 10.2(L)  Hematocrit 36.0 - 46.0 % 31.2(L) 37.8 30.2(L)  Platelets  150 - 400 K/uL 180 149(L) 185     CMP Latest Ref Rng & Units 10/21/2018 10/12/2018 10/07/2018  Glucose 70 - 99 mg/dL 91 95 95  BUN 6 - 20 mg/dL _0 Creatinine 0.44 - 1.00 mg/dL 0.78 0.76 0.79  Sodium 135 - 145 mmol/L 143 138 141  Potassium 3.5 - 5.1 mmol/L 3.0(LL) 3.6 3.1(L)  Chloride 98 - 111 mmol/L 108 105 106  CO2 22 - 32 mmol/L _1 Calcium 8.9 - 10.3 mg/dL 8.8(L) 8.7(L) 8.6(L)  Total Protein 6.5 - 8.1 g/dL 6.1(L) - 6.1(L)  Total Bilirubin 0.3 - 1.2 mg/dL 1.5(H) - 1.8(H)  Alkaline Phos 38 - 126 U/L 57 - 59  AST 15 - 41 U/L 13(L) - 13(L)  ALT 0 - 44 U/L 14 - 13      RADIOGRAPHIC STUDIES: I have personally reviewed the radiological images as listed and agreed with the findings in the report. No results found.   ASSESSMENT & PLAN:  AZAYLIA FONG is a 53 y.o. 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 -Shepreviously completed 3 cycles of Enhertu and stopped due to poor toleration with anorexia, weight loss, N&V, diarrhea -I restarted her onTaxol 2 weeks on/1 week off with Herceptin and Perjeta q3weekson 06/17/18, she tolerated first cycle well then developed diarrhea secondary to Perjeta.I d/cbenadryl given allergy.Due to persistent diarrhea, Perjeta was stopped after C5 on 07/29/18. -She is clinically doing well and stable She has intermittent chest pain and hippains. Her neuropathy is still mainly in her feet, but tolerable. Exam showed mild decreased vibration sensation, mainly on her feet, labs reviewed, CBC and CMP WNL except WBC 3.4, Hg 10.7, K3, CA 8.8, Protein 8.8, Protein 6.1, AST 13, Tbili 1.5. Mag 1.7. Overall adequate to proceed with Taxol and Herceptin.  -Will watch her neuropathy as we may reduce dose or change treatment soon.   -Plan for next PET scan in 2-3  weeks to evaluate her response to treatment  -F/u in 3 weeks    2.Body pain, secondary to bone mets, Hypocalcemia  -since her cancer diagnosis,she has had b/l hip, shoulder and chest painat different time points, likely related to her bone mets -Ipreviouslyprescribed herGabapentin, butshedecided not to take due to potential side effect of suicidal thoughts. 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 -Will monitor for need of palliative radiation. -Her back and chest pain now resolved. Her main pain is in upper left leg. She only takes oxycodone 1-2 times a day now. -continue Zometa every 3 months -She has mild hypocalcemia likely secondary to Zometa. She does have occasional calf cramps. I recommended she take OTC calcium and Vitamin D. I will check her Vit D periodically.  -Her chest pain did recur and was evaluated in ED, cardiac work up negative, possible muscular pain, versus bone mets. Her pain overall has improved. She still has intermittent b/l hip pain. I refilled her Oxycodone for #60 tablets today (10/21/18).  3.Congenital deafness  4.Mild anemia, secondary to chemotherapy and malignancy -She will start with oral iron daily for 1-2 months and then stop. She will also start daily women's vitamin. -Hgat10.7(10/21/18), stable   5.Hypokalemia, Diarrhea -He has intermittent hyperkalemia, on oral supplementKCL. -Ipreviouslyinstructed her to reduce dairy products in diet. -She developed diarrhea s/p cycle 3 Perjeta. Will dose reduce. -She will takes Potassium55mq in infusion today and then take 166m TID daily.   -Kat3, Mag 1.7 today (10/21/18).I instructed her to take 2 potassium pills in clinic and will given IV Potassium and 2 more tabs today.  -I encouraged her to start Mag 4009maily (10/21/18), will call in today  6.Goal of care  discussion -she understand her goal of care is palliative to prolong her life -she is full code for now  7.Insomnia -She cannot take Benadryl due to allergy reaction. She has tried trazodone with no relief.  -I advised her to avoid taking Ambien10m68mxycodone and Xanax all at the same time as this is too much sedation. Oxycodone can be taken with Ambien or with Xanax. She understands. 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 -Her neuropathy is currently mild and stable in her feet and LE, will monitor on treatment. -she declined using ice bags using infusion -Stable and manageable in her feet.  -She has slight sensory deficit in feet more than her hands on her exam today (10/21/18)   PLAN: -I refilled oxycodone #60 tabs today  -Will give IV Potassium 20me51mday, she will continue 10meq19m at home  -Lab reviewed, overall adequate to proceed with Taxol, Herceptintoday -she will return next week for Taxol -Lab, flush,andtaxol/Herceptinin3 weeks  -PET scan on 10/27 -F/u in 3 weeks on 10/29    No problem-specific Assessment & Plan notes found for this encounter.   Orders Placed This Encounter  Procedures  . NM PET Image Restag (PS) Skull Base To Thigh    Standing Status:   Future    Standing Expiration Date:   10/21/2019    Order Specific Question:   If indicated for the ordered procedure, I authorize the administration of a radiopharmaceutical per Radiology protocol    Answer:   Yes    Order Specific Question:   Is the patient pregnant?    Answer:   No    Order Specific Question:   Radiology Contrast Protocol - do NOT remove file path  Answer:   _[email or web address]   All questions were answered. The patient knows to call the clinic with any problems, questions or concerns. No barriers to learning was detected. I spent 20 minutes counseling the patient face to face. The total time spent in the appointment was 25 minutes and  more than 50% was on counseling and review of test results     Truitt Merle, MD 10/21/2018   I, Joslyn Devon, am acting as scribe for Truitt Merle, MD.   I have reviewed the above documentation for accuracy and completeness, and I agree with the above.

## 2018-10-21 ENCOUNTER — Other Ambulatory Visit: Payer: Self-pay

## 2018-10-21 ENCOUNTER — Inpatient Hospital Stay: Payer: Medicaid Other

## 2018-10-21 ENCOUNTER — Inpatient Hospital Stay (HOSPITAL_BASED_OUTPATIENT_CLINIC_OR_DEPARTMENT_OTHER): Payer: Medicaid Other | Admitting: Hematology

## 2018-10-21 ENCOUNTER — Inpatient Hospital Stay: Payer: Medicaid Other | Attending: Hematology

## 2018-10-21 ENCOUNTER — Encounter: Payer: Self-pay | Admitting: Hematology

## 2018-10-21 VITALS — BP 124/73 | HR 65 | Temp 98.7°F | Resp 18 | Ht 65.0 in | Wt 134.2 lb

## 2018-10-21 DIAGNOSIS — H905 Unspecified sensorineural hearing loss: Secondary | ICD-10-CM

## 2018-10-21 DIAGNOSIS — G620 Drug-induced polyneuropathy: Secondary | ICD-10-CM | POA: Insufficient documentation

## 2018-10-21 DIAGNOSIS — Z170 Estrogen receptor positive status [ER+]: Secondary | ICD-10-CM | POA: Insufficient documentation

## 2018-10-21 DIAGNOSIS — Z5112 Encounter for antineoplastic immunotherapy: Secondary | ICD-10-CM | POA: Insufficient documentation

## 2018-10-21 DIAGNOSIS — G4700 Insomnia, unspecified: Secondary | ICD-10-CM | POA: Diagnosis not present

## 2018-10-21 DIAGNOSIS — G8929 Other chronic pain: Secondary | ICD-10-CM | POA: Insufficient documentation

## 2018-10-21 DIAGNOSIS — C7951 Secondary malignant neoplasm of bone: Secondary | ICD-10-CM | POA: Diagnosis not present

## 2018-10-21 DIAGNOSIS — C50911 Malignant neoplasm of unspecified site of right female breast: Secondary | ICD-10-CM

## 2018-10-21 DIAGNOSIS — D6481 Anemia due to antineoplastic chemotherapy: Secondary | ICD-10-CM | POA: Diagnosis not present

## 2018-10-21 DIAGNOSIS — E876 Hypokalemia: Secondary | ICD-10-CM | POA: Insufficient documentation

## 2018-10-21 DIAGNOSIS — R079 Chest pain, unspecified: Secondary | ICD-10-CM | POA: Diagnosis not present

## 2018-10-21 DIAGNOSIS — Z5111 Encounter for antineoplastic chemotherapy: Secondary | ICD-10-CM | POA: Insufficient documentation

## 2018-10-21 DIAGNOSIS — Z95828 Presence of other vascular implants and grafts: Secondary | ICD-10-CM

## 2018-10-21 LAB — CMP (CANCER CENTER ONLY)
ALT: 14 U/L (ref 0–44)
AST: 13 U/L — ABNORMAL LOW (ref 15–41)
Albumin: 3.6 g/dL (ref 3.5–5.0)
Alkaline Phosphatase: 57 U/L (ref 38–126)
Anion gap: 10 (ref 5–15)
BUN: 9 mg/dL (ref 6–20)
CO2: 25 mmol/L (ref 22–32)
Calcium: 8.8 mg/dL — ABNORMAL LOW (ref 8.9–10.3)
Chloride: 108 mmol/L (ref 98–111)
Creatinine: 0.78 mg/dL (ref 0.44–1.00)
GFR, Est AFR Am: >60 mL/min (ref >60)
GFR, Estimated: >60 mL/min (ref >60)
Glucose, Bld: 91 mg/dL (ref 70–99)
Potassium: 3 mmol/L — CL (ref 3.5–5.1)
Sodium: 143 mmol/L (ref 135–145)
Total Bilirubin: 1.5 mg/dL — ABNORMAL HIGH (ref 0.3–1.2)
Total Protein: 6.1 g/dL — ABNORMAL LOW (ref 6.5–8.1)

## 2018-10-21 LAB — CBC WITH DIFFERENTIAL (CANCER CENTER ONLY)
Abs Immature Granulocytes: 0.03 10*3/uL (ref 0.00–0.07)
Basophils Absolute: 0 10*3/uL (ref 0.0–0.1)
Basophils Relative: 1 %
Eosinophils Absolute: 0 10*3/uL (ref 0.0–0.5)
Eosinophils Relative: 1 %
HCT: 31.2 % — ABNORMAL LOW (ref 36.0–46.0)
Hemoglobin: 10.7 g/dL — ABNORMAL LOW (ref 12.0–15.0)
Immature Granulocytes: 1 %
Lymphocytes Relative: 22 %
Lymphs Abs: 0.8 10*3/uL (ref 0.7–4.0)
MCH: 30.1 pg (ref 26.0–34.0)
MCHC: 34.3 g/dL (ref 30.0–36.0)
MCV: 87.6 fL (ref 80.0–100.0)
Monocytes Absolute: 0.3 10*3/uL (ref 0.1–1.0)
Monocytes Relative: 10 %
Neutro Abs: 2.2 10*3/uL (ref 1.7–7.7)
Neutrophils Relative %: 65 %
Platelet Count: 180 10*3/uL (ref 150–400)
RBC: 3.56 MIL/uL — ABNORMAL LOW (ref 3.87–5.11)
RDW: 14 % (ref 11.5–15.5)
WBC Count: 3.4 10*3/uL — ABNORMAL LOW (ref 4.0–10.5)
nRBC: 0 % (ref 0.0–0.2)

## 2018-10-21 LAB — MAGNESIUM: Magnesium: 1.7 mg/dL (ref 1.7–2.4)

## 2018-10-21 MED ORDER — FAMOTIDINE IN NACL 20-0.9 MG/50ML-% IV SOLN
20.0000 mg | Freq: Once | INTRAVENOUS | Status: AC
  Administered 2018-10-21: 10:00:00 20 mg via INTRAVENOUS

## 2018-10-21 MED ORDER — LORATADINE 10 MG PO TABS
ORAL_TABLET | ORAL | Status: AC
  Filled 2018-10-21: qty 1

## 2018-10-21 MED ORDER — OXYCODONE HCL 5 MG PO TABS: 5.0000 mg | ORAL_TABLET | Freq: Three times a day (TID) | ORAL | 0 refills | Status: DC | PRN

## 2018-10-21 MED ORDER — ACETAMINOPHEN 325 MG PO TABS
650.0000 mg | ORAL_TABLET | Freq: Once | ORAL | Status: AC
  Administered 2018-10-21: 10:00:00 650 mg via ORAL

## 2018-10-21 MED ORDER — ACETAMINOPHEN 325 MG PO TABS
ORAL_TABLET | ORAL | Status: AC
  Filled 2018-10-21: qty 2

## 2018-10-21 MED ORDER — SODIUM CHLORIDE 0.9% FLUSH
10.0000 mL | Freq: Once | INTRAVENOUS | Status: AC
  Administered 2018-10-21: 09:00:00 10 mL
  Filled 2018-10-21: qty 10

## 2018-10-21 MED ORDER — LORATADINE 10 MG PO TABS
10.0000 mg | ORAL_TABLET | Freq: Once | ORAL | Status: AC
  Administered 2018-10-21: 10:00:00 10 mg via ORAL

## 2018-10-21 MED ORDER — DEXAMETHASONE SODIUM PHOSPHATE 10 MG/ML IJ SOLN
INTRAMUSCULAR | Status: AC
  Filled 2018-10-21: qty 1

## 2018-10-21 MED ORDER — DEXAMETHASONE SODIUM PHOSPHATE 10 MG/ML IJ SOLN
10.0000 mg | Freq: Once | INTRAMUSCULAR | Status: AC
  Administered 2018-10-21: 10 mg via INTRAVENOUS

## 2018-10-21 MED ORDER — SODIUM CHLORIDE 0.9 % IV SOLN
60.0000 mg/m2 | Freq: Once | INTRAVENOUS | Status: AC
  Administered 2018-10-21: 12:00:00 96 mg via INTRAVENOUS
  Filled 2018-10-21: qty 16

## 2018-10-21 MED ORDER — SODIUM CHLORIDE 0.9 % IV SOLN
Freq: Once | INTRAVENOUS | Status: AC
  Administered 2018-10-21: 10:00:00 via INTRAVENOUS
  Filled 2018-10-21: qty 250

## 2018-10-21 MED ORDER — POTASSIUM CHLORIDE 10 MEQ/100ML IV SOLN
10.0000 meq | INTRAVENOUS | Status: AC
  Administered 2018-10-21 (×2): 10 meq via INTRAVENOUS
  Filled 2018-10-21: qty 100

## 2018-10-21 MED ORDER — MAGNESIUM OXIDE 400 (241.3 MG) MG PO TABS: 400.0000 mg | ORAL_TABLET | Freq: Every day | ORAL | 3 refills | Status: DC

## 2018-10-21 MED ORDER — POTASSIUM CHLORIDE 10 MEQ/100ML IV SOLN: 10.0000 meq | INTRAVENOUS | Status: DC

## 2018-10-21 MED ORDER — SODIUM CHLORIDE 0.9% FLUSH
10.0000 mL | INTRAVENOUS | Status: DC | PRN
  Administered 2018-10-21: 13:00:00 10 mL
  Filled 2018-10-21: qty 10

## 2018-10-21 MED ORDER — TRASTUZUMAB-DKST CHEMO 150 MG IV SOLR
6.0000 mg/kg | Freq: Once | INTRAVENOUS | Status: AC
  Administered 2018-10-21: 11:00:00 357 mg via INTRAVENOUS
  Filled 2018-10-21: qty 17

## 2018-10-21 MED ORDER — FAMOTIDINE IN NACL 20-0.9 MG/50ML-% IV SOLN
INTRAVENOUS | Status: AC
  Filled 2018-10-21: qty 50

## 2018-10-21 MED ORDER — HEPARIN SOD (PORK) LOCK FLUSH 100 UNIT/ML IV SOLN
500.0000 [IU] | Freq: Once | INTRAVENOUS | Status: AC | PRN
  Administered 2018-10-21: 13:00:00 500 [IU]
  Filled 2018-10-21: qty 5

## 2018-10-21 NOTE — Patient Instructions (Signed)

## 2018-10-21 NOTE — Progress Notes (Signed)
CRITICAL VALUE STICKER  CRITICAL VALUE: Potassium 3.0  RECEIVER (on-site recipient of call): [name]  DATE & TIME NOTIFIED: 10/21/18  MD NOTIFIED: Dr. Truitt Merle  RESPONSE:  Give 2 hr IV replacement potassium. Per [facility]rom[facility]--IV potassium compatible with Pepcid, Trastuzumab, and Paclitaxel. Infusion RN updated

## 2018-10-24 ENCOUNTER — Telehealth: Payer: Self-pay | Admitting: Hematology

## 2018-10-24 NOTE — Telephone Encounter (Signed)
Scheduled appt per 10/09 los.  Left a voice message with an interpreter.

## 2018-10-28 ENCOUNTER — Inpatient Hospital Stay: Payer: Medicaid Other

## 2018-10-28 ENCOUNTER — Other Ambulatory Visit: Payer: Self-pay | Admitting: Hematology

## 2018-10-28 ENCOUNTER — Telehealth: Payer: Self-pay | Admitting: Hematology

## 2018-10-28 ENCOUNTER — Other Ambulatory Visit: Payer: Self-pay

## 2018-10-28 VITALS — BP 119/70 | HR 70 | Temp 97.8°F | Resp 16 | Wt 135.8 lb

## 2018-10-28 DIAGNOSIS — C50919 Malignant neoplasm of unspecified site of unspecified female breast: Secondary | ICD-10-CM

## 2018-10-28 DIAGNOSIS — Z5112 Encounter for antineoplastic immunotherapy: Secondary | ICD-10-CM | POA: Diagnosis not present

## 2018-10-28 DIAGNOSIS — C50911 Malignant neoplasm of unspecified site of right female breast: Secondary | ICD-10-CM

## 2018-10-28 DIAGNOSIS — C7951 Secondary malignant neoplasm of bone: Secondary | ICD-10-CM

## 2018-10-28 DIAGNOSIS — Z95828 Presence of other vascular implants and grafts: Secondary | ICD-10-CM

## 2018-10-28 LAB — CBC WITH DIFFERENTIAL (CANCER CENTER ONLY)
Abs Immature Granulocytes: 0.17 10*3/uL — ABNORMAL HIGH (ref 0.00–0.07)
Basophils Absolute: 0 10*3/uL (ref 0.0–0.1)
Basophils Relative: 1 %
Eosinophils Absolute: 0.1 10*3/uL (ref 0.0–0.5)
Eosinophils Relative: 2 %
HCT: 29.5 % — ABNORMAL LOW (ref 36.0–46.0)
Hemoglobin: 10 g/dL — ABNORMAL LOW (ref 12.0–15.0)
Immature Granulocytes: 4 %
Lymphocytes Relative: 24 %
Lymphs Abs: 1 10*3/uL (ref 0.7–4.0)
MCH: 30.3 pg (ref 26.0–34.0)
MCHC: 33.9 g/dL (ref 30.0–36.0)
MCV: 89.4 fL (ref 80.0–100.0)
Monocytes Absolute: 0.3 10*3/uL (ref 0.1–1.0)
Monocytes Relative: 6 %
Neutro Abs: 2.6 10*3/uL (ref 1.7–7.7)
Neutrophils Relative %: 63 %
Platelet Count: 183 10*3/uL (ref 150–400)
RBC: 3.3 MIL/uL — ABNORMAL LOW (ref 3.87–5.11)
RDW: 14 % (ref 11.5–15.5)
WBC Count: 4.1 10*3/uL (ref 4.0–10.5)
nRBC: 0 % (ref 0.0–0.2)

## 2018-10-28 LAB — MAGNESIUM: Magnesium: 1.8 mg/dL (ref 1.7–2.4)

## 2018-10-28 LAB — CMP (CANCER CENTER ONLY)
ALT: 18 U/L (ref 0–44)
AST: 15 U/L (ref 15–41)
Albumin: 3.4 g/dL — ABNORMAL LOW (ref 3.5–5.0)
Alkaline Phosphatase: 51 U/L (ref 38–126)
Anion gap: 10 (ref 5–15)
BUN: 10 mg/dL (ref 6–20)
CO2: 26 mmol/L (ref 22–32)
Calcium: 8.5 mg/dL — ABNORMAL LOW (ref 8.9–10.3)
Chloride: 107 mmol/L (ref 98–111)
Creatinine: 0.81 mg/dL (ref 0.44–1.00)
GFR, Est AFR Am: >60 mL/min (ref >60)
GFR, Estimated: >60 mL/min (ref >60)
Glucose, Bld: 86 mg/dL (ref 70–99)
Potassium: 3.2 mmol/L — ABNORMAL LOW (ref 3.5–5.1)
Sodium: 143 mmol/L (ref 135–145)
Total Bilirubin: 1.6 mg/dL — ABNORMAL HIGH (ref 0.3–1.2)
Total Protein: 6 g/dL — ABNORMAL LOW (ref 6.5–8.1)

## 2018-10-28 MED ORDER — DEXAMETHASONE SODIUM PHOSPHATE 10 MG/ML IJ SOLN
INTRAMUSCULAR | Status: AC
  Filled 2018-10-28: qty 1

## 2018-10-28 MED ORDER — SODIUM CHLORIDE 0.9 % IJ SOLN: 10.0000 mL | INTRAMUSCULAR | Status: DC | PRN

## 2018-10-28 MED ORDER — SODIUM CHLORIDE 0.9 % IV SOLN
Freq: Once | INTRAVENOUS | Status: AC
  Administered 2018-10-28: 10:00:00 via INTRAVENOUS
  Filled 2018-10-28: qty 250

## 2018-10-28 MED ORDER — FAMOTIDINE IN NACL 20-0.9 MG/50ML-% IV SOLN
20.0000 mg | Freq: Once | INTRAVENOUS | Status: AC
  Administered 2018-10-28: 20 mg via INTRAVENOUS

## 2018-10-28 MED ORDER — SODIUM CHLORIDE 0.9% FLUSH
10.0000 mL | INTRAVENOUS | Status: DC | PRN
  Administered 2018-10-28: 10 mL via INTRAVENOUS
  Filled 2018-10-28: qty 10

## 2018-10-28 MED ORDER — SODIUM CHLORIDE 0.9 % IV SOLN
60.0000 mg/m2 | Freq: Once | INTRAVENOUS | Status: AC
  Administered 2018-10-28: 96 mg via INTRAVENOUS
  Filled 2018-10-28: qty 16

## 2018-10-28 MED ORDER — HEPARIN SOD (PORK) LOCK FLUSH 100 UNIT/ML IV SOLN
500.0000 [IU] | Freq: Once | INTRAVENOUS | Status: AC | PRN
  Administered 2018-10-28: 500 [IU]
  Filled 2018-10-28: qty 5

## 2018-10-28 MED ORDER — DEXAMETHASONE SODIUM PHOSPHATE 10 MG/ML IJ SOLN
10.0000 mg | Freq: Once | INTRAMUSCULAR | Status: AC
  Administered 2018-10-28: 10 mg via INTRAVENOUS

## 2018-10-28 MED ORDER — FAMOTIDINE IN NACL 20-0.9 MG/50ML-% IV SOLN
INTRAVENOUS | Status: AC
  Filled 2018-10-28: qty 50

## 2018-10-28 MED ORDER — SODIUM CHLORIDE 0.9% FLUSH
10.0000 mL | INTRAVENOUS | Status: DC | PRN
  Administered 2018-10-28: 10 mL
  Filled 2018-10-28: qty 10

## 2018-10-28 NOTE — Patient Instructions (Signed)
[facility] Discharge Instructions for Patients Receiving Chemotherapy  Today you received the following chemotherapy agents Paclitaxel (TAXOL).  To help prevent nausea and vomiting after your treatment, we encourage you to take your nausea medication as prescribed.  If you develop nausea and vomiting that is not controlled by your nausea medication, call the clinic.   BELOW ARE SYMPTOMS THAT SHOULD BE REPORTED IMMEDIATELY:  *FEVER GREATER THAN 100.5 F  *CHILLS WITH OR WITHOUT FEVER  NAUSEA AND VOMITING THAT IS NOT CONTROLLED WITH YOUR NAUSEA MEDICATION  *UNUSUAL SHORTNESS OF BREATH  *UNUSUAL BRUISING OR BLEEDING  TENDERNESS IN MOUTH AND THROAT WITH OR WITHOUT PRESENCE OF ULCERS  *URINARY PROBLEMS  *BOWEL PROBLEMS  UNUSUAL RASH Items with * indicate a potential emergency and should be followed up as soon as possible.  Feel free to call the clinic should you have any questions or concerns. The clinic phone number is [phone].  Please show the CHEMO ALERT CARD at check-in to the Emergency Department and triage nurse.  Coronavirus (COVID-19) Are you at risk?  Are you at risk for the Coronavirus (COVID-19)?  To be considered HIGH RISK for Coronavirus (COVID-19), you have to meet the following criteria:  . Traveled to [place], [place], [place], [place] or [place]; or in the [place] to [place], [place], [place], or [place]; and have fever, cough, and shortness of breath within the last 2 weeks of travel OR . Been in close contact with a person diagnosed with COVID-19 within the last 2 weeks and have fever, cough, and shortness of breath . IF YOU DO NOT MEET THESE CRITERIA, YOU ARE CONSIDERED LOW RISK FOR COVID-19.  What to do if you are HIGH RISK for COVID-19?  . If you are having a medical emergency, call 911. . Seek medical care right away. Before you go to a doctor's office, urgent care or emergency department, call ahead and tell them about  your recent travel, contact with someone diagnosed with COVID-19, and your symptoms. You should receive instructions from your physician's office regarding next steps of care.  . When you arrive at healthcare provider, tell the healthcare staff immediately you have returned from visiting [place], [place], [place], [place] or [place]; or traveled in the [place] to [place], [place], [place], or [place]; in the last two weeks or you have been in close contact with a person diagnosed with COVID-19 in the last 2 weeks.   . Tell the health care staff about your symptoms: fever, cough and shortness of breath. . After you have been seen by a medical provider, you will be either: o Tested for (COVID-19) and discharged home on quarantine except to seek medical care if symptoms worsen, and asked to  - Stay home and avoid contact with others until you get your results (4-5 days)  - Avoid travel on public transportation if possible (such as bus, train, or airplane) or o Sent to the Emergency Department by EMS for evaluation, COVID-19 testing, and possible admission depending on your condition and test results.  What to do if you are LOW RISK for COVID-19?  Reduce your risk of any infection by using the same precautions used for avoiding the common cold or flu:  . Wash your hands often with soap and warm water for at least 20 seconds.  If soap and water are not readily available, use an alcohol-based hand sanitizer with at least 60% alcohol.  . If coughing or sneezing,   cover your mouth and nose by coughing or sneezing into the elbow areas of your shirt or coat, into a tissue or into your sleeve (not your hands). . Avoid shaking hands with others and consider head nods or verbal greetings only. . Avoid touching your eyes, nose, or mouth with unwashed hands.  . Avoid close contact with people who are sick. . Avoid places or events with large numbers of people in one location, like concerts or sporting  events. . Carefully consider travel plans you have or are making. . If you are planning any travel outside or inside the [place], visit the CDC's Travelers' Health webpage for the latest health notices. . If you have some symptoms but not all symptoms, continue to monitor at home and seek medical attention if your symptoms worsen. . If you are having a medical emergency, call 911.   ADDITIONAL HEALTHCARE OPTIONS FOR PATIENTS  [facility] / e-Visit: [email or web address]         [facility]: [phone]  [facility]: [phone]                   [facility]: [phone]     

## 2018-10-28 NOTE — Telephone Encounter (Signed)
Patient/interpreter stopped by scheduling to make changes to appointments. YF out 10/30 and 10/28 appointments will either need to be decoupled or all moved to 10/29 mid day. Per patient she will keep 10/28 she just does not want to see APP. Spoke with YF and per YF ok to move patient to her schedule. Per YF swapped her 8:20 am f/u to LB and LB 8:15 am (this patient) to YF.   Also per patient moved 11/5 to 11/6. Per patient she comes needs to keep her appointments scheduled on Friday.  Patient given updated schedule while in scheduling department. Onsite scheduler assisted with this changed via phone.

## 2018-10-28 NOTE — Patient Instructions (Signed)

## 2018-10-28 NOTE — Progress Notes (Signed)
Per Dr. Burr Medico, okay for patient to receive treatment today with total bilirubin 1.6.

## 2018-10-31 NOTE — Progress Notes (Signed)
[place]   [phone]) [phone] [phone]   Clinic Follow up Note   Patient Care Team: [name], [name], [name] as PCP - General (Physician Assistant) [name], MD as Consulting Physician (Orthopedic Surgery)  Date of Service:  11/09/2018  CHIEF COMPLAINT: F/u ofmetastatic breast cancer  SUMMARY OF ONCOLOGIC HISTORY: Oncology History Overview Note   Cancer Staging No matching staging information was found for the patient.     [name], right ([place])  11/1997 Cancer Diagnosis   Patient had 1.4 cm poorly differentiated right breast carcinoma diagnosed in Nov 1999 at age 32, 1/7 axillary nodes involved, ER/PR and HER 2 positive. She had mastectomy with the 7 axillary node evaluation, 4 cycles of adriamycin/ cytoxan followed by taxotere, then five years of tamoxifen thru June 2005 (no herceptin in 1999). She had bilateral oophorectomy in May 2005. She was briefly on aromatase inhibitor after tamoxifen, but discontinued this herself due to poor tolerance.   04/04/2015 Genetic Testing   Negative genetic testing on the breast/ovarian cancer pnael.  The Breast/Ovarian gene panel offered by [facility] includes sequencing and rearrangement analysis for the following 20 genes:  ATM, BARD1, BRCA1, BRCA2, BRIP1, CDH1, CHEK2, EPCAM, FANCC, MLH1, MSH2, MSH6, NBN, PALB2, PMS2, PTEN, RAD51C, RAD51D, TP53, and XRCC2.   06/26/2015 Pathology Results   Initial Path Report Bone, curettage, Left femur met, intramedullary subtroch tissue - METASTATIC ADENOCARCINOMA.  Estrogen Receptor: 95%, POSITIVE, STRONG STAINING INTENSITY Progesterone Receptor: 2%, POSITIVE, STRONG  HER2 (+), IHC 3+   06/26/2015 Surgery   Biopsy of metastatic tissue and stabilization with Affixus trochanteric nail, proximal and distal interlock of left femur by Dr. [name]    06/28/2015 Imaging   CT Chest w/ Contrast 1. Extensive right internal mammary chain  lymphadenopathy consistent with metastatic breast cancer. 2. Lytic metastases involving the posterior elements at T1 with probable nondisplaced pathologic fracture of the spinous process. 3. No other evidence of thoracic metastatic disease. 4. Trace bilateral pleural effusions with associated bibasilar atelectasis. 5. Indeterminate right thyroid nodule, not previously imaged. This could be evaluated with thyroid ultrasound as clinically warranted.   07/01/2015 Imaging   Bone Scan 1. Increased activity noted throughout the left femur. Although metastatic disease cannot be excluded, these changes are most likely secondary to prior surgery. 2. No other focal abnormalities identified to suggest metastatic disease.   07/12/2015 - 07/26/2015 Radiation Therapy   Left femur, 30 Gy in 10 fractions by Dr. Sondra Come    07/14/2015 Initial Diagnosis   [name], right ([place])   07/19/2015 Imaging   Initial PET Scan 1. Severe multifocal osseous metastatic disease, much greater than anticipated based on prior imaging. 2. Multifocal nodal metastases involving the right internal mammary, prevascular and subpectoral lymph nodes. No axillary pulmonary involvement identified. 3. No distant extra osseous metastases.   07/30/2015 - 11/29/2015 Chemotherapy   Docetaxel, Herceptin and Perjeta every 3 weeks X6 cycles, pt tolerated moderately well, restaging scan showed excellent response    09/18/2015 Imaging   CT Head w/wo Contrast 1. No acute intracranial abnormality or significant interval change. 2. Stable minimal periventricular white matter hypoattenuation on the right. This may reflect a remote ischemic injury. 3. No evidence for metastatic disease to the brain. 4. No focal soft tissue lesion to explain the patient's tenderness.   10/30/2015 Imaging   CT Abdomen Pelvis w/ Contrast 1. Few mildly prominent fluid-filled loops of small bowel scattered throughout the abdomen, with  intraluminal fluid density within the distal colon. Given the provided history, findings are suggestive of acute enteritis/diarrheal illness. 2. No other acute intra-abdominal or pelvic process identified. 3. Widespread osseous metastatic disease, better evaluated on most recent PET-CT from 07/19/2015. No pathologic fracture or other complication. 4. 11 mm hypodensity within the left kidney. This lesion measures intermediate density, and is indeterminate. While this lesion is similar in size relative to recent studies, this is increased in size relative to prior study from 2012. Further evaluation with dedicated renal mass protocol CT and/or MRI is recommended for complete characterization.   12/2015 - 05/14/2016 Chemotherapy   Maintenance Herceptin and pejeta every 3 weeks stopped on 05/14/16 due to disease progression   12/25/2015 Imaging   Restaging PET Scan 1. Markedly improved skeletal activity, with only several faint foci of residual accentuated metabolic activity, but resolution of the vast majority of the previously extensive osseous metastatic disease. 2. Resolution of the prior right internal mammary and right prevascular lymph nodes. 3. Residual subcutaneous edema and edema along fascia planes in the left upper thigh. This remains somewhat more than I would expect for placement of an IM nail 6 weeks ago. If there is leg swelling further down, consider Doppler venous ultrasound to rule out left lower extremity DVT. I do not perceive an obvious difference in density between the pelvic and common femoral veins on the noncontrast CT data. 4. Chronic right maxillary and right sphenoid sinusitis.   01/16/2016 - 10/20/2017 Anti-estrogen oral therapy   Letrozole 2.5 mg daily. She was switched to exemestane due to disease progression on 02/08/17. Stopped on 10/20/2017 when treatment changed to chemo    03/01/2016 Miscellaneous   Patient presented to ED following a fall; she reports she landed on her  back and is now experiencing back pain. The patient was evaluated and discharge home same day with pain control.   05/01/2016 Imaging   CT CAP IMPRESSION: 1. Stable exam.  No new or progressive disease identified. 2. No evidence for residual or recurrent adenopathy within the chest. 3. Stable bone metastasis.   05/01/2016 Imaging   BONE SCAN IMPRESSION: 1. Small focal uptake involving the anterior rib ends of the left second and right fourth ribs, new since the prior bone scan. The location of this uptake is more suggestive of a traumatic or inflammatory etiology as opposed to metastatic disease. No other evidence of metastatic disease. 2. There are other areas of stable uptake which are most likely degenerative/reactive, including right shoulder and cervical spine uptake and uptake along the right femur adjacent to the ORIF hardware.   05/20/2016 Imaging   NM PET Skull to Thigh IMPRESSION: 1. There multiple metastatic foci in the bony pelvis, including some new abnormal foci compare to the prior PET-CT, compatible with mildly progressive bony metastatic disease. Also, a left T11 vertebral hypermetabolic metastatic lesion is observed, new compared to the prior exam. 2. No active extraosseous malignancy is currently identified. 3. Right anterior fourth rib healing fracture, appears be   06/04/2016 - 01/08/2017 Chemotherapy   Second line chemotherapy Kadcyla every 3 weeks started on 06/04/16 and stopped on 01/08/17 due to mixed response.     10/05/2016 Imaging   CT CAP and Bone Scan:  Bones: left intramedullary rod and left femoral head neck screw in place. In the proximal diaphysis of the left femur there is cortical thickening and irregularity. No lytic or blastic bone lesions. No fracture or vertebral endplate destruction.   No definite evidence of recurrent  disease in the chest, abdomen, or pelvis.   10/28/2016 Imaging   CT A/P IMPRESSION: No acute process demonstrated in  the abdomen or pelvis. No evidence of bowel obstruction or inflammation.   11/04/2016 Imaging   DG foot complete left: IMPRESSION: No fracture or dislocation.  No soft tissue abnormality   11/23/2016 Imaging   CT RIGHT FEMUR IMPRESSION: Negative CT scan of the right thigh.  No visible metastatic disease.  CT LEFT FEMUR IMPRESSION: 1. Postsurgical changes related to prior cephalomedullary rod fixation of the left femur with linear lucency along the anterolateral proximal femoral cortex at level of the lesser trochanter, suspicious for nondisplaced fracture. 2. Slightly permeative appearance of the proximal left femoral cortex at the site of known prior osseous metastases is largely unchanged. 3. Unchanged patchy lucency and sclerosis along the anterior acetabulum, which appears to correspond with an area of faint uptake on the prior PET-CT, suspicious for osseous metastasis. These results will be called to the ordering clinician or representative by the Radiologist Assistant, and communication documented in the PACS or zVision Dashboard.     01/27/2017 Imaging   IMPRESSION: 1. Mixed response to osseous metastasis. Some hypermetabolic lesions are new and increasingly hypermetabolic, while another index lesion is decreasingly hypermetabolic. 2. No evidence of hypermetabolic soft tissue metastasis.    02/08/2017 - 10/19/2017 Antibody Plan   -Herceptin every 3 week since 02/08/17 -Oral Lanpantinib 1548m and decreased to 5027mdue to diarrhea and depression. Due to disease progression she swithced to Verzenio 15032mn 05/04/17. She did not toelrate well so she was switched to Ibrance 100m35m 05/31/17. Due to her neutropenia, Ibrance dose reduced to 75 mg daily, 3 weeks on, one-week off, stopped on 10/20/2017 due to disease progression.    04/30/2017 PET scan   IMPRESSION: 1. Primarily increase in hypermetabolism of osseous metastasis. An isolated right acetabular lesion is  decreased in hypermetabolism since the prior exam. 2. No evidence of hypermetabolic soft tissue metastasis. 3. Similar non FDG avid right-sided thyroid nodule, favoring a benign etiology. Recommend attention on follow-up.   08/30/2017 PET scan   08/30/2017 PET Scan  IMPRESSION: 1. Worsening osseous metastatic disease. 2. Focal hypermetabolism in the central spinal canal the L1 level, similar to the prior exam. Metastatic implant cannot be excluded. 3. Slight enlargement of a right thyroid nodule which now appears more cystic in character. Consider further evaluation with thyroid ultrasound. If patient is clinically hyperthyroid, consider nuclear medicine thyroid uptake and scan   08/30/2017 Progression   08/30/2017 PET Scan  IMPRESSION: 1. Worsening osseous metastatic disease. 2. Focal hypermetabolism in the central spinal canal the L1 level, similar to the prior exam. Metastatic implant cannot be excluded. 3. Slight enlargement of a right thyroid nodule which now appears more cystic in character. Consider further evaluation with thyroid ultrasound. If patient is clinically hyperthyroid, consider nuclear medicine thyroid uptake and scan   09/29/2017 - 10/11/2017 Radiation Therapy   Pt received 27 Gy in 9 fractions directed at the areas of significant uptake noted on recent bone scan the right pelvis and right proximal femur, with Dr, KinaSondra Come10/09/2017 - 04/07/2018 Chemotherapy   -Herceptin every 3 weeks starting 02/08/17, perjeta added on 10/20/2017  -weekly Taxol started on 10/21/2017. Will reduce Taxol to 2 weeks on, 1 week off starting on 02/04/17.  Stoped due to mild disease progression   10/27/2017 Imaging   10/27/2017 CT AP IMPRESSION: 1. No acute abnormality. No evidence of bowel obstruction or acute bowel  inflammation. Normal appendix. 2. Stable patchy sclerotic osseous metastases throughout the visualized skeleton. No new or progressive metastatic disease in the abdomen  or pelvis.   01/11/2018 Imaging   Whole Body Bone Scan 01/11/18  IMPRESSION: 1. Multifocal osseous metastases as described. 2. Right scapular metastasis. 3. Right superior thoracic spinous process. 4. Right sacral ala and right L5 metastases. 5. Right acetabular metastasis. 6. Right proximal femur metastasis. 7. Diffuse metastases throughout the left femur. 8. Anterior right fourth rib metastasis.   01/11/2018 Imaging   CT CAP W Contrast 01/11/18  IMPRESSION: 1. Similar-appearing osseous metastatic disease. 2. No evidence for progressed metastatic disease in the chest, abdomen or pelvis.   03/30/2018 Imaging   Bone Scan 03/30/18  IMPRESSION: Stable multifocal bony metastatic disease.   03/30/2018 Imaging    CT CAP W Contrast 03/30/18 IMPRESSION: 1. New mild contiguous wall thickening with associated mucosal hyperenhancement throughout the left colon and rectum, suggesting nonspecific infectious or inflammatory proctocolitis. Differential includes C diff colitis. 2. Stable faint patchy sclerosis in the iliac bones and lumbar vertebral bodies. No new or progressive osseous metastatic disease by CT. Please note that the osseous lesions are relatively occult by CT and would be better evaluated by PET-CT, as clinically warranted. 3. No evidence of extra osseous metastatic disease.  These results will be called to the ordering clinician or representative by the Radiologist Assistant, and communication documented in the PACS or zVision Dashboard.   04/15/2018 - 06/16/2018 Chemotherapy   Enhertu q3weeks starting 04/15/18. Stopped Enhertu after cycle 3 on 05/27/18 due to poor toleration.    04/21/2018 PET scan   PET 04/21/18  IMPRESSION: 1. Overall marked interval improvement in bony hypermetabolic disease. All of the previously identified hypermetabolic osseous metastases have decreased in hypermetabolism in the interval. There is a new tiny focus of hypermetabolism in the right aspect  of the L3 vertebral body that appears to correspond to a new 8 mm lucency, raising concern for new metastatic disease. Close attention on follow-up recommended. 2. Focal hypermetabolism in the central spinal canal at the level of L1 previously has resolved. 3. No hypermetabolic soft tissue disease on today's study. 4. Right thyroid nodule seen previously has decreased in the interval.    06/16/2018 Imaging   CT LUMBAR SPINE WO CONTRAST 06/16/18 IMPRESSION: 1. Transitional lumbosacral anatomy. Correlation with radiographs is recommended prior to any operative intervention. 2. Suspicion of a right L3 vertebral body metastasis on the April PET-CT is not evident by CT. Stable degenerative versus metastatic lesion at the right L5 superior endplate. Small but increased right iliac bone metastasis since March. Lumbar MRI would best characterize lumbar metastatic disease (without contrast if necessary). 3. Suspect L2-L3 left subarticular segment disc herniation. Query left L2 radiculitis. 4. Stable appearance of rightward L4-L5 disc degeneration which could affect the right foramen and right lateral recess.   06/17/2018 -  Chemotherapy   Restart Taxol 2 weeks on/1 week off with Herceptin and Perjeta q3weeks on 06/17/18   07/06/2018 PET scan   PET  IMPRESSION: 1. Near total resolution of hypermetabolic activity associated with skeletal metastasis. Very mild activity associated with the LEFT iliac bone which is similar to background blood pool activity. 2. No new or progressive disease.      CURRENT THERAPY:  Restart Taxol 2 weeks on/1 week off with Herceptin and Perjeta q3weeks on 06/17/18. Dose reduce Perjeta with cycle 5 due to diarrhea.Perjeta stopped after 07/29/18 due to persistent diarrhea  INTERVAL HISTORY:  [name]  is here for a follow up and treatment. She presents to the clinic with her interpretor. She notes her last treatment went well. She notes she had neck cramping  and LE numbness for 1 day. The legs resolved after 1 day and her neck is improving slowly. She also notes no nausea but notes some taste change for a few days after chemo and wants to make her infusions earlier in the week so she can eat better on the weekends.  She notes her leg pain is stable. She will take Oxycodone only as needed which is not daily. She notes her sleep is fair and insomnia is in intermittent. She still takes Ambien and on bad nights she will take more which is not helping her.   REVIEW OF SYSTEMS:   Constitutional: Denies fevers, chills or abnormal weight loss Eyes: Denies blurriness of vision Ears, nose, mouth, throat, and face: Denies mucositis or sore throat Respiratory: Denies cough, dyspnea or wheezes Cardiovascular: Denies palpitation, chest discomfort or lower extremity swelling Gastrointestinal:  Denies nausea, heartburn or change in bowel habits Skin: Denies abnormal skin rashes MSK: (+) Stable hip and leg pain (+) Recent neck cramps and occasional leg cramps Lymphatics: Denies new lymphadenopathy or easy bruising Neurological:Denies numbness, tingling or new weaknesses Behavioral/Psych: Mood is stable, no new changes  All other systems were reviewed with the patient and are negative.  MEDICAL HISTORY:  Past Medical History:  Diagnosis Date  . Abnormal Pap smear   . Anxiety   . Breast cancer ([place])   . Cancer ([place]) 1999   breast-s/p mastectomy, chemo, rad  . CIN I (cervical intraepithelial neoplasia I) 2003   by colpo  . Congenital deafness   . Deaf   . Depression   . Port-A-Cath in place 2017   for chemotherapy  . Radiation 07/12/15-07/26/15   left femur 30 Gy  . S/P bilateral oophorectomy     SURGICAL HISTORY: Past Surgical History:  Procedure Laterality Date  . ABDOMINAL HYSTERECTOMY    . INTRAMEDULLARY (IM) NAIL INTERTROCHANTERIC Left 06/26/2015   Procedure: LEFT BIOMET LONG AFFIXS NAIL;  Surgeon: [name], MD;  Location: [facility];  Service:  Orthopedics;  Laterality: Left;  . IR GENERIC HISTORICAL  08/06/2015   IR US GUIDE VASC ACCESS LEFT 08/06/2015 Aletta Edouard, MD [facility]  . IR GENERIC HISTORICAL  08/06/2015   IR FLUORO GUIDE CV LINE LEFT 08/06/2015 Aletta Edouard, MD [facility]  . IR GENERIC HISTORICAL  03/05/2016   IR CV LINE INJECTION 03/05/2016 Markus Daft, MD [facility]  . LEFT HEART CATH AND CORONARY ANGIOGRAPHY N/A 12/13/2017   Procedure: LEFT HEART CATH AND CORONARY ANGIOGRAPHY;  Surgeon: Leonie Man, MD;  Location: [facility];  Service: Cardiovascular;  Laterality: N/A;  . MASTECTOMY     right breast  . MASTECTOMY    . OVARIAN CYST REMOVAL    . RADIOLOGY WITH ANESTHESIA Left 06/25/2015   Procedure: MRI OF LEFT HIP WITH OR WITHOUT CONTRAST;  Surgeon: Medication Radiologist, MD;  Location: [facility];  Service: Radiology;  Laterality: Left;  DR. MCINTYRE/MRI  . TUBAL LIGATION      I have reviewed the social history and family history with the patient and they are unchanged from previous note.  ALLERGIES:  is allergic to promethazine hcl; chlorhexidine; and diphenhydramine hcl.  MEDICATIONS:  Current Outpatient Medications  Medication Sig Dispense Refill  . albuterol (PROAIR HFA) 108 (90 Base) MCG/ACT inhaler Inhale 2 puffs into the lungs every  6 (six) hours as needed for wheezing or shortness of breath.    Marland Kitchen albuterol (PROVENTIL HFA;VENTOLIN HFA) 108 (90 Base) MCG/ACT inhaler Inhale 2 puffs into the lungs every 6 (six) hours as needed for wheezing or shortness of breath. 1 Inhaler 0  . ALPRAZolam (XANAX) 0.25 MG tablet Take 1 tablet (0.25 mg total) by mouth 2 (two) times daily as needed for anxiety. 30 tablet 0  . dexamethasone (DECADRON) 4 MG tablet Take 1 tablet (4 mg total) by mouth daily. 20 tablet 0  . diphenoxylate-atropine (LOMOTIL) 2.5-0.025 MG tablet Take 1-2 tablets by mouth 4 (four) times daily as needed for diarrhea or loose stools. 30 tablet 1  . lidocaine-prilocaine (EMLA) cream Apply 1  application topically as needed. Apply to Porta-Cath 1-2 hours prior to access as directed. (Patient taking differently: Apply 1 application topically as needed (To Porta-Cath 1-2 hours prior to access as directed). ) 90 g 2  . magnesium oxide (MAG-OX) 400 (241.3 Mg) MG tablet Take 1 tablet (400 mg total) by mouth daily. 30 tablet 3  . methocarbamol (ROBAXIN) 500 MG tablet Take 1 tablet (500 mg total) by mouth 2 (two) times daily. (Patient taking differently: Take 500 mg by mouth 2 (two) times daily as needed for muscle spasms. ) 20 tablet 0  . omeprazole (PRILOSEC) 20 MG capsule Take 1 capsule (20 mg total) by mouth daily. 30 capsule 0  . ondansetron (ZOFRAN) 8 MG tablet Take 1 tablet (8 mg total) by mouth every 8 (eight) hours as needed for nausea or vomiting. 45 tablet 1  . oxyCODONE (ROXICODONE) 5 MG immediate release tablet Take 1 tablet (5 mg total) by mouth every 8 (eight) hours as needed for severe pain. 60 tablet 0  . potassium chloride (K-DUR) 10 MEQ tablet Take 2 tablets (20 mEq total) by mouth 2 (two) times daily. (Patient taking differently: Take 30 mEq by mouth daily. ) 180 tablet 3  . prochlorperazine (COMPAZINE) 10 MG tablet Take 1 tablet (10 mg total) by mouth every 6 (six) hours as needed for nausea or vomiting. 60 tablet 2  . zolpidem (AMBIEN) 5 MG tablet Take 1.5 tablets (7.5 mg total) by mouth at bedtime as needed for sleep. (Patient taking differently: Take 5 mg by mouth at bedtime. ) 45 tablet 0  . ergocalciferol (VITAMIN D2) 1.25 MG (50000 UT) capsule Take 1 capsule (50,000 Units total) by mouth once a week. 8 capsule 0   No current facility-administered medications for this visit.    Facility-Administered Medications Ordered in Other Visits  Medication Dose Route Frequency Provider Last Rate Last Dose  . potassium chloride SA (K-DUR) CR tablet 40 mEq  40 mEq Oral Once Truitt Merle, MD      . sodium chloride 0.9 % 1,000 mL with potassium chloride 10 mEq infusion   Intravenous  Continuous Gordy Levan, MD   Stopped at 09/10/15 1653  . sodium chloride 0.9 % injection 10 mL  10 mL Intravenous PRN Livesay, Lennis P, MD        PHYSICAL EXAMINATION: ECOG PERFORMANCE STATUS: 1 - Symptomatic but completely ambulatory  Vitals:   11/09/18 0837  BP: 108/70  Pulse: 81  Resp: 18  Temp: 98.2 F (36.8 C)  SpO2: 100%   Filed Weights   11/09/18 0837  Weight: 138 lb 14.4 oz (63 kg)    GENERAL:alert, no distress and comfortable SKIN: skin color, texture, turgor are normal, no rashes or significant lesions EYES: normal, Conjunctiva are pink and non-injected,  sclera clear  NECK: supple, thyroid normal size, non-tender, without nodularity LYMPH:  no palpable lymphadenopathy in the cervical, axillary  LUNGS: clear to auscultation and percussion with normal breathing effort HEART: regular rate & rhythm and no murmurs and no lower extremity edema ABDOMEN:abdomen soft, non-tender and normal bowel sounds Musculoskeletal:no cyanosis of digits and no clubbing  NEURO: alert & oriented x 3 with fluent speech, no focal motor/sensory deficits  LABORATORY DATA:  I have reviewed the data as listed CBC Latest Ref Rng & Units 11/09/2018 10/28/2018 10/21/2018  WBC 4.0 - 10.5 K/uL 3.1(L) 4.1 3.4(L)  Hemoglobin 12.0 - 15.0 g/dL 10.8(L) 10.0(L) 10.7(L)  Hematocrit 36.0 - 46.0 % 31.3(L) 29.5(L) 31.2(L)  Platelets 150 - 400 K/uL 189 183 180     CMP Latest Ref Rng & Units 11/09/2018 10/28/2018 10/21/2018  Glucose 70 - 99 mg/dL 98 86 91  BUN 6 - 20 mg/dL _0 Creatinine 0.44 - 1.00 mg/dL 0.95 0.81 0.78  Sodium 135 - 145 mmol/L 146(H) 143 143  Potassium 3.5 - 5.1 mmol/L 3.2(L) 3.2(L) 3.0(LL)  Chloride 98 - 111 mmol/L 107 107 108  CO2 22 - 32 mmol/L _1 Calcium 8.9 - 10.3 mg/dL 8.8(L) 8.5(L) 8.8(L)  Total Protein 6.5 - 8.1 g/dL 6.1(L) 6.0(L) 6.1(L)  Total Bilirubin 0.3 - 1.2 mg/dL 1.3(H) 1.6(H) 1.5(H)  Alkaline Phos 38 - 126 U/L 55 51 57  AST 15 - 41 U/L 13(L) 15 13(L)   ALT 0 - 44 U/L _2 RADIOGRAPHIC STUDIES: I have personally reviewed the radiological images as listed and agreed with the findings in the report. No results found.   ASSESSMENT & PLAN:  JOHANNAH ROZAS is a 53 y.o. 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 -Shepreviously completed 3 cycles of Enhertu and stopped due to poor toleration with anorexia, weight loss, N&V, diarrhea -I restarted her onTaxol 2 weeks on/1 week off with Herceptin and Perjeta q3weekson 06/17/18, she tolerated first cycle well then developed diarrhea secondary to Perjeta.I d/cbenadryl given allergy.Due to persistent diarrhea, Perjeta was stopped after C5 on 07/29/18. -She was to have a PET scan but her insurance denied it. I will request an appeal. If still denied will change to CT CAP and bone scan before next visit. If insurance agrees I may obtain a brain scan as brian is a common place for HER2 positive disease to spread. She is agreeable.  -She is clinically doing well and stable. For her recent neck cramps I encouraged her to use heating pad. Her hip and leg pain is stable and controlled.  -I discussed the newly FDA approved oral HER2 antibody Tucatinib as a next line therapy. The benefit and potential side effects were discussed with her, she is interested.  I will consider if she has disease progression or intolerance to the current regiment.  -I ordered restaging PET scan last time, it was denied by her insurance, will appeal.  If it is approved, was scheduled before next office visit in 3 weeks. -Labs reviewed, CBC and CMP WNL except WBC 3.1, Hg 10.8, K3.2 and tbil 1.3. Mag 1.6. CA 27.29 still pending. Overall adequate to proceed with overall adequate to proceed with  Taxol and Herceptin today.  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  2.Body pain, secondary to bone mets, Hypocalcemia -since her cancer diagnosis,she has had b/l hip, shoulder and chest painat different time points, likely related to her bone mets -Ipreviouslyprescribed herGabapentin, butshedecided not to take due to potential side effect of suicidal thoughts. 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 -Will monitor for need of palliative radiation. -Her back and chest pain now resolved. Her main pain is in upper left leg. She only takes oxycodone 1-2 times a day now. -continueZometaevery 3 months -She has mild hypocalcemia likely secondary to Zometa. She does have occasional calf cramps. I recommended she take OTC calcium and Vitamin D. I will check her Vit D periodically.  -Her chest pain did recur and was evaluated in ED, cardiac work up negative, possible muscular pain, versus bone mets. Her pain overall has improved. She still has intermittent b/l hip pain. I refilled her Oxycodone for #60 tablets on 10/21/18. She does not use daily.  -For her occasional leg cramps, I strongly encouraged her to continue oral calcium.   3.Congenital deafness  4.Mild anemia, secondary to chemotherapy and malignancy -She will start with oral iron daily for 1-2 months and then stop. She will also start daily women's vitamin. -Hgat10.8(11/09/18), stable  5.Hypokalemia, Diarrhea -She developed diarrhea s/p cycle 3 Perjeta. Has been d/c  -She was previously started on Potassium50mq TID daily. -I started her on Mag 402mdaily (10/21/18) -Kat3.2, Mag 1.6 today (11/09/18). -She takes Oral potassium 2-3 times a day. Given she does not take potassium TID consistently  I encouraged her to increase potassium in her diet. She is agreeable.    6.Goal of care discussion -she understand her goal of care is palliative to prolong her life -she is full code for now  7.Insomnia -She cannot take Benadryl due to allergy reaction. She has tried trazodone with no relief.  -I advised her to avoid taking Ambien1025moxycodone and Xanax all at the same time as this is too much sedation. Oxycodone can be taken with Ambien or with Xanax. She understands. -Has been fair lately with intermittent insomnia. She notes Ambien is not helping her anymore. I encouraged her to stop Ambien and try Xanax, she understands.   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 -Her neuropathy is currently mild and stable in her feet and LE, will monitor on treatment. -she declined using ice bags using infusion -Stable and manageable in her feet.   9. Vitamin D Deficiency  -As seen on 10/07/18 labs at 18.4.  -I will start her on high dose 50,000 Unit oral Vitamin D weekly for 2 months. She is agreeable. I will call in today (11/09/18).  -Once she completes she will return to Calcium with Vitamin D.    PLAN: -I called in Oral Vitamin D 50K unit weekly X8 today and she will start oral calcium supplement -Labs reviewed and adequate to proceed with Taxol, Herceptintoday -she will return next week for Taxol -Zometa infusion today -Repeat echo before next treatment -F/u in 3 weeks with Lab, flush, PET scan or CT CAP and Bone scan a few days before.  -brain CT w contrast for brain metastasis surveillance    No problem-specific Assessment & Plan notes found for this encounter.   Orders Placed This Encounter  Procedures  . CT Head W Contrast  Rule out brain metastasis. She will get CT head wo contrast from PET scan around same time    Standing Status:   Future    Standing Expiration Date:   11/09/2019    Order Specific Question:   If indicated for the ordered procedure, I  authorize the administration of contrast media per Radiology protocol    Answer:   Yes    Order Specific Question:   Is patient pregnant?    Answer:   No    Order Specific Question:   Preferred imaging location?    Answer:   [facility]    Order Specific Question:   Radiology Contrast Protocol - do NOT remove file path    Answer:   [email or web address]   All questions were answered. The patient knows to call the clinic with any problems, questions or concerns. No barriers to learning was detected. I spent 20 minutes counseling the patient face to face. The total time spent in the appointment was 30 minutes and more than 50% was on counseling and review of test results     Truitt Merle, MD 11/09/2018   I, Joslyn Devon, am acting as scribe for Truitt Merle, MD.   I have reviewed the above documentation for accuracy and completeness, and I agree with the above.

## 2018-11-09 ENCOUNTER — Telehealth: Payer: Self-pay | Admitting: Hematology

## 2018-11-09 ENCOUNTER — Other Ambulatory Visit: Payer: Self-pay

## 2018-11-09 ENCOUNTER — Inpatient Hospital Stay: Payer: Medicaid Other

## 2018-11-09 ENCOUNTER — Inpatient Hospital Stay (HOSPITAL_BASED_OUTPATIENT_CLINIC_OR_DEPARTMENT_OTHER): Payer: Medicaid Other | Admitting: Hematology

## 2018-11-09 ENCOUNTER — Encounter: Payer: Self-pay | Admitting: Hematology

## 2018-11-09 VITALS — BP 108/70 | HR 81 | Temp 98.2°F | Resp 18 | Ht 65.0 in | Wt 138.9 lb

## 2018-11-09 DIAGNOSIS — C50911 Malignant neoplasm of unspecified site of right female breast: Secondary | ICD-10-CM

## 2018-11-09 DIAGNOSIS — C7951 Secondary malignant neoplasm of bone: Secondary | ICD-10-CM

## 2018-11-09 DIAGNOSIS — Z5112 Encounter for antineoplastic immunotherapy: Secondary | ICD-10-CM | POA: Diagnosis not present

## 2018-11-09 DIAGNOSIS — C50919 Malignant neoplasm of unspecified site of unspecified female breast: Secondary | ICD-10-CM

## 2018-11-09 LAB — CBC WITH DIFFERENTIAL (CANCER CENTER ONLY)
Abs Immature Granulocytes: 0.04 K/uL (ref 0.00–0.07)
Basophils Absolute: 0 K/uL (ref 0.0–0.1)
Basophils Relative: 1 %
Eosinophils Absolute: 0 K/uL (ref 0.0–0.5)
Eosinophils Relative: 1 %
HCT: 31.3 % — ABNORMAL LOW (ref 36.0–46.0)
Hemoglobin: 10.8 g/dL — ABNORMAL LOW (ref 12.0–15.0)
Immature Granulocytes: 1 %
Lymphocytes Relative: 26 %
Lymphs Abs: 0.8 K/uL (ref 0.7–4.0)
MCH: 30.8 pg (ref 26.0–34.0)
MCHC: 34.5 g/dL (ref 30.0–36.0)
MCV: 89.2 fL (ref 80.0–100.0)
Monocytes Absolute: 0.4 K/uL (ref 0.1–1.0)
Monocytes Relative: 13 %
Neutro Abs: 1.8 K/uL (ref 1.7–7.7)
Neutrophils Relative %: 58 %
Platelet Count: 189 K/uL (ref 150–400)
RBC: 3.51 MIL/uL — ABNORMAL LOW (ref 3.87–5.11)
RDW: 14.1 % (ref 11.5–15.5)
WBC Count: 3.1 K/uL — ABNORMAL LOW (ref 4.0–10.5)
nRBC: 0 % (ref 0.0–0.2)

## 2018-11-09 LAB — CMP (CANCER CENTER ONLY)
ALT: 18 U/L (ref 0–44)
AST: 13 U/L — ABNORMAL LOW (ref 15–41)
Albumin: 3.4 g/dL — ABNORMAL LOW (ref 3.5–5.0)
Alkaline Phosphatase: 55 U/L (ref 38–126)
Anion gap: 16 — ABNORMAL HIGH (ref 5–15)
BUN: 7 mg/dL (ref 6–20)
CO2: 23 mmol/L (ref 22–32)
Calcium: 8.8 mg/dL — ABNORMAL LOW (ref 8.9–10.3)
Chloride: 107 mmol/L (ref 98–111)
Creatinine: 0.95 mg/dL (ref 0.44–1.00)
GFR, Est AFR Am: >60 mL/min (ref >60)
GFR, Estimated: >60 mL/min (ref >60)
Glucose, Bld: 98 mg/dL (ref 70–99)
Potassium: 3.2 mmol/L — ABNORMAL LOW (ref 3.5–5.1)
Sodium: 146 mmol/L — ABNORMAL HIGH (ref 135–145)
Total Bilirubin: 1.3 mg/dL — ABNORMAL HIGH (ref 0.3–1.2)
Total Protein: 6.1 g/dL — ABNORMAL LOW (ref 6.5–8.1)

## 2018-11-09 LAB — MAGNESIUM: Magnesium: 1.6 mg/dL — ABNORMAL LOW (ref 1.7–2.4)

## 2018-11-09 MED ORDER — ERGOCALCIFEROL 1.25 MG (50000 UT) PO CAPS: 50000.0000 [IU] | ORAL_CAPSULE | ORAL | 0 refills | Status: DC

## 2018-11-09 MED ORDER — LORATADINE 10 MG PO TABS
ORAL_TABLET | ORAL | Status: AC
  Filled 2018-11-09: qty 1

## 2018-11-09 MED ORDER — TRASTUZUMAB-DKST CHEMO 150 MG IV SOLR
6.0000 mg/kg | Freq: Once | INTRAVENOUS | Status: AC
  Administered 2018-11-09: 357 mg via INTRAVENOUS
  Filled 2018-11-09: qty 17

## 2018-11-09 MED ORDER — ACETAMINOPHEN 325 MG PO TABS
650.0000 mg | ORAL_TABLET | Freq: Once | ORAL | Status: AC
  Administered 2018-11-09: 650 mg via ORAL

## 2018-11-09 MED ORDER — SODIUM CHLORIDE 0.9% FLUSH
10.0000 mL | INTRAVENOUS | Status: DC | PRN
  Administered 2018-11-09: 10 mL
  Filled 2018-11-09: qty 10

## 2018-11-09 MED ORDER — HEPARIN SOD (PORK) LOCK FLUSH 100 UNIT/ML IV SOLN
500.0000 [IU] | Freq: Once | INTRAVENOUS | Status: AC | PRN
  Administered 2018-11-09: 500 [IU]
  Filled 2018-11-09: qty 5

## 2018-11-09 MED ORDER — SODIUM CHLORIDE 0.9 % IV SOLN
60.0000 mg/m2 | Freq: Once | INTRAVENOUS | Status: AC
  Administered 2018-11-09: 96 mg via INTRAVENOUS
  Filled 2018-11-09: qty 16

## 2018-11-09 MED ORDER — DEXAMETHASONE SODIUM PHOSPHATE 10 MG/ML IJ SOLN
10.0000 mg | Freq: Once | INTRAMUSCULAR | Status: AC
  Administered 2018-11-09: 10 mg via INTRAVENOUS

## 2018-11-09 MED ORDER — SODIUM CHLORIDE 0.9 % IV SOLN
Freq: Once | INTRAVENOUS | Status: AC
  Administered 2018-11-09: 09:00:00 via INTRAVENOUS
  Filled 2018-11-09: qty 250

## 2018-11-09 MED ORDER — LORATADINE 10 MG PO TABS
10.0000 mg | ORAL_TABLET | Freq: Once | ORAL | Status: AC
  Administered 2018-11-09: 10 mg via ORAL

## 2018-11-09 MED ORDER — ACETAMINOPHEN 325 MG PO TABS
ORAL_TABLET | ORAL | Status: AC
  Filled 2018-11-09: qty 2

## 2018-11-09 MED ORDER — FAMOTIDINE IN NACL 20-0.9 MG/50ML-% IV SOLN
INTRAVENOUS | Status: AC
  Filled 2018-11-09: qty 50

## 2018-11-09 MED ORDER — FAMOTIDINE IN NACL 20-0.9 MG/50ML-% IV SOLN
20.0000 mg | Freq: Once | INTRAVENOUS | Status: AC
  Administered 2018-11-09: 20 mg via INTRAVENOUS

## 2018-11-09 MED ORDER — DEXAMETHASONE SODIUM PHOSPHATE 10 MG/ML IJ SOLN
INTRAMUSCULAR | Status: AC
  Filled 2018-11-09: qty 1

## 2018-11-09 NOTE — Patient Instructions (Signed)
[facility] Discharge Instructions for Patients Receiving Chemotherapy  Today you received the following chemotherapy agents Trastuzumab and Taxol  To help prevent nausea and vomiting after your treatment, we encourage you to take your nausea medication as directed.    If you develop nausea and vomiting that is not controlled by your nausea medication, call the clinic.   BELOW ARE SYMPTOMS THAT SHOULD BE REPORTED IMMEDIATELY:  *FEVER GREATER THAN 100.5 F  *CHILLS WITH OR WITHOUT FEVER  NAUSEA AND VOMITING THAT IS NOT CONTROLLED WITH YOUR NAUSEA MEDICATION  *UNUSUAL SHORTNESS OF BREATH  *UNUSUAL BRUISING OR BLEEDING  TENDERNESS IN MOUTH AND THROAT WITH OR WITHOUT PRESENCE OF ULCERS  *URINARY PROBLEMS  *BOWEL PROBLEMS  UNUSUAL RASH Items with * indicate a potential emergency and should be followed up as soon as possible.  Feel free to call the clinic should you have any questions or concerns. The clinic phone number is [phone].  Please show the CHEMO ALERT CARD at check-in to the Emergency Department and triage nurse.   

## 2018-11-09 NOTE — Patient Instructions (Signed)

## 2018-11-09 NOTE — Telephone Encounter (Signed)
Gave patient avs report and appointments for November. Per 10/28 los moved 11/6 appointments to Wednesday 11/4. Per patient all appointments should be on Wednesday with the exception of the week of Thanksgiving. Patient patient request appointment that would be 11/25 be moved to Friday 11/27. Message to [place].

## 2018-11-10 ENCOUNTER — Telehealth: Payer: Self-pay

## 2018-11-10 LAB — CANCER ANTIGEN 27.29: CA 27.29: 34.6 U/mL (ref 0.0–38.6)

## 2018-11-10 NOTE — Telephone Encounter (Signed)
Patient has been scheduled for earliest PET scan available.  We have it scheduled for Tuesday 11/3 at 3:30 pm, NPO for 6 hours prior, this will be at [facility].  Left patient a voice message through [employer].

## 2018-11-14 NOTE — Addendum Note (Signed)
Addended by: Truitt Merle on: 11/14/2018 06:47 AM   Modules accepted: Orders

## 2018-11-15 ENCOUNTER — Ambulatory Visit (HOSPITAL_COMMUNITY): Payer: Medicaid Other

## 2018-11-16 ENCOUNTER — Inpatient Hospital Stay: Payer: Medicaid Other

## 2018-11-16 ENCOUNTER — Other Ambulatory Visit: Payer: Self-pay

## 2018-11-16 ENCOUNTER — Inpatient Hospital Stay: Payer: Medicaid Other | Attending: Hematology

## 2018-11-16 VITALS — BP 101/68 | HR 74 | Temp 97.8°F | Resp 18

## 2018-11-16 DIAGNOSIS — C50911 Malignant neoplasm of unspecified site of right female breast: Secondary | ICD-10-CM | POA: Diagnosis present

## 2018-11-16 DIAGNOSIS — Z170 Estrogen receptor positive status [ER+]: Secondary | ICD-10-CM | POA: Diagnosis not present

## 2018-11-16 DIAGNOSIS — Z5111 Encounter for antineoplastic chemotherapy: Secondary | ICD-10-CM | POA: Diagnosis present

## 2018-11-16 DIAGNOSIS — C7951 Secondary malignant neoplasm of bone: Secondary | ICD-10-CM | POA: Diagnosis not present

## 2018-11-16 DIAGNOSIS — H905 Unspecified sensorineural hearing loss: Secondary | ICD-10-CM | POA: Insufficient documentation

## 2018-11-16 DIAGNOSIS — Z95828 Presence of other vascular implants and grafts: Secondary | ICD-10-CM

## 2018-11-16 DIAGNOSIS — G893 Neoplasm related pain (acute) (chronic): Secondary | ICD-10-CM | POA: Diagnosis not present

## 2018-11-16 DIAGNOSIS — G4700 Insomnia, unspecified: Secondary | ICD-10-CM | POA: Insufficient documentation

## 2018-11-16 DIAGNOSIS — E876 Hypokalemia: Secondary | ICD-10-CM | POA: Diagnosis not present

## 2018-11-16 DIAGNOSIS — Z5112 Encounter for antineoplastic immunotherapy: Secondary | ICD-10-CM | POA: Diagnosis present

## 2018-11-16 DIAGNOSIS — G620 Drug-induced polyneuropathy: Secondary | ICD-10-CM | POA: Insufficient documentation

## 2018-11-16 DIAGNOSIS — D6481 Anemia due to antineoplastic chemotherapy: Secondary | ICD-10-CM | POA: Insufficient documentation

## 2018-11-16 LAB — CMP (CANCER CENTER ONLY)
ALT: 16 U/L (ref 0–44)
AST: 13 U/L — ABNORMAL LOW (ref 15–41)
Albumin: 3.7 g/dL (ref 3.5–5.0)
Alkaline Phosphatase: 52 U/L (ref 38–126)
Anion gap: 9 (ref 5–15)
BUN: 10 mg/dL (ref 6–20)
CO2: 24 mmol/L (ref 22–32)
Calcium: 9.1 mg/dL (ref 8.9–10.3)
Chloride: 107 mmol/L (ref 98–111)
Creatinine: 0.83 mg/dL (ref 0.44–1.00)
GFR, Est AFR Am: >60 mL/min (ref >60)
GFR, Estimated: >60 mL/min (ref >60)
Glucose, Bld: 77 mg/dL (ref 70–99)
Potassium: 3.5 mmol/L (ref 3.5–5.1)
Sodium: 140 mmol/L (ref 135–145)
Total Bilirubin: 1.7 mg/dL — ABNORMAL HIGH (ref 0.3–1.2)
Total Protein: 6.3 g/dL — ABNORMAL LOW (ref 6.5–8.1)

## 2018-11-16 LAB — CBC WITH DIFFERENTIAL (CANCER CENTER ONLY)
Abs Immature Granulocytes: 0.17 10*3/uL — ABNORMAL HIGH (ref 0.00–0.07)
Basophils Absolute: 0 10*3/uL (ref 0.0–0.1)
Basophils Relative: 1 %
Eosinophils Absolute: 0.1 10*3/uL (ref 0.0–0.5)
Eosinophils Relative: 2 %
HCT: 31.1 % — ABNORMAL LOW (ref 36.0–46.0)
Hemoglobin: 10.6 g/dL — ABNORMAL LOW (ref 12.0–15.0)
Immature Granulocytes: 5 %
Lymphocytes Relative: 27 %
Lymphs Abs: 1 10*3/uL (ref 0.7–4.0)
MCH: 30.5 pg (ref 26.0–34.0)
MCHC: 34.1 g/dL (ref 30.0–36.0)
MCV: 89.6 fL (ref 80.0–100.0)
Monocytes Absolute: 0.3 10*3/uL (ref 0.1–1.0)
Monocytes Relative: 7 %
Neutro Abs: 2.1 10*3/uL (ref 1.7–7.7)
Neutrophils Relative %: 58 %
Platelet Count: 203 10*3/uL (ref 150–400)
RBC: 3.47 MIL/uL — ABNORMAL LOW (ref 3.87–5.11)
RDW: 13.6 % (ref 11.5–15.5)
WBC Count: 3.6 10*3/uL — ABNORMAL LOW (ref 4.0–10.5)
nRBC: 0 % (ref 0.0–0.2)

## 2018-11-16 MED ORDER — FAMOTIDINE IN NACL 20-0.9 MG/50ML-% IV SOLN
20.0000 mg | Freq: Once | INTRAVENOUS | Status: AC
  Administered 2018-11-16: 20 mg via INTRAVENOUS

## 2018-11-16 MED ORDER — FAMOTIDINE IN NACL 20-0.9 MG/50ML-% IV SOLN
INTRAVENOUS | Status: AC
  Filled 2018-11-16: qty 50

## 2018-11-16 MED ORDER — HEPARIN SOD (PORK) LOCK FLUSH 100 UNIT/ML IV SOLN
500.0000 [IU] | Freq: Once | INTRAVENOUS | Status: AC | PRN
  Administered 2018-11-16: 11:00:00 500 [IU]
  Filled 2018-11-16: qty 5

## 2018-11-16 MED ORDER — DEXAMETHASONE SODIUM PHOSPHATE 10 MG/ML IJ SOLN
INTRAMUSCULAR | Status: AC
  Filled 2018-11-16: qty 1

## 2018-11-16 MED ORDER — SODIUM CHLORIDE 0.9% FLUSH
10.0000 mL | INTRAVENOUS | Status: DC | PRN
  Administered 2018-11-16: 08:00:00 10 mL via INTRAVENOUS
  Filled 2018-11-16: qty 10

## 2018-11-16 MED ORDER — SODIUM CHLORIDE 0.9% FLUSH
10.0000 mL | INTRAVENOUS | Status: DC | PRN
  Administered 2018-11-16: 10 mL
  Filled 2018-11-16: qty 10

## 2018-11-16 MED ORDER — DEXAMETHASONE SODIUM PHOSPHATE 10 MG/ML IJ SOLN
10.0000 mg | Freq: Once | INTRAMUSCULAR | Status: AC
  Administered 2018-11-16: 10 mg via INTRAVENOUS

## 2018-11-16 MED ORDER — SODIUM CHLORIDE 0.9 % IV SOLN
Freq: Once | INTRAVENOUS | Status: AC
  Administered 2018-11-16: 09:00:00 via INTRAVENOUS
  Filled 2018-11-16: qty 250

## 2018-11-16 MED ORDER — SODIUM CHLORIDE 0.9 % IV SOLN
60.0000 mg/m2 | Freq: Once | INTRAVENOUS | Status: AC
  Administered 2018-11-16: 96 mg via INTRAVENOUS
  Filled 2018-11-16: qty 16

## 2018-11-16 NOTE — Patient Instructions (Signed)
[facility] Discharge Instructions for Patients Receiving Chemotherapy  Today you received the following chemotherapy agents:  Taxol.  To help prevent nausea and vomiting after your treatment, we encourage you to take your nausea medication as directed.   If you develop nausea and vomiting that is not controlled by your nausea medication, call the clinic.   BELOW ARE SYMPTOMS THAT SHOULD BE REPORTED IMMEDIATELY:  *FEVER GREATER THAN 100.5 F  *CHILLS WITH OR WITHOUT FEVER  NAUSEA AND VOMITING THAT IS NOT CONTROLLED WITH YOUR NAUSEA MEDICATION  *UNUSUAL SHORTNESS OF BREATH  *UNUSUAL BRUISING OR BLEEDING  TENDERNESS IN MOUTH AND THROAT WITH OR WITHOUT PRESENCE OF ULCERS  *URINARY PROBLEMS  *BOWEL PROBLEMS  UNUSUAL RASH Items with * indicate a potential emergency and should be followed up as soon as possible.  Feel free to call the clinic should you have any questions or concerns. The clinic phone number is [phone].  Please show the CHEMO ALERT CARD at check-in to the Emergency Department and triage nurse.   

## 2018-11-16 NOTE — Patient Instructions (Signed)

## 2018-11-16 NOTE — Progress Notes (Signed)
Per Dr. Burr Medico, ok to treat with total bili of 1.7.

## 2018-11-17 ENCOUNTER — Other Ambulatory Visit: Payer: Medicaid Other

## 2018-11-17 ENCOUNTER — Ambulatory Visit: Payer: Medicaid Other

## 2018-11-18 ENCOUNTER — Other Ambulatory Visit: Payer: Medicaid Other

## 2018-11-18 ENCOUNTER — Ambulatory Visit: Payer: Medicaid Other

## 2018-11-21 ENCOUNTER — Other Ambulatory Visit: Payer: Self-pay

## 2018-11-21 ENCOUNTER — Ambulatory Visit (HOSPITAL_COMMUNITY): Payer: Medicaid Other

## 2018-11-21 ENCOUNTER — Ambulatory Visit (HOSPITAL_COMMUNITY): Admission: RE | Admit: 2018-11-21 | Payer: Medicaid Other | Source: Ambulatory Visit

## 2018-11-21 ENCOUNTER — Ambulatory Visit (HOSPITAL_COMMUNITY)
Admission: RE | Admit: 2018-11-21 | Discharge: 2018-11-21 | Disposition: A | Discharge status: Home / Self Care | Payer: Medicaid Other | Source: Ambulatory Visit | Attending: Hematology | Admitting: Hematology

## 2018-11-21 DIAGNOSIS — C7951 Secondary malignant neoplasm of bone: Secondary | ICD-10-CM | POA: Insufficient documentation

## 2018-11-21 DIAGNOSIS — C50911 Malignant neoplasm of unspecified site of right female breast: Secondary | ICD-10-CM | POA: Insufficient documentation

## 2018-11-21 DIAGNOSIS — C50919 Malignant neoplasm of unspecified site of unspecified female breast: Secondary | ICD-10-CM | POA: Diagnosis not present

## 2018-11-21 LAB — GLUCOSE, CAPILLARY: Glucose-Capillary: 96 mg/dL (ref 70–99)

## 2018-11-21 MED ORDER — FLUDEOXYGLUCOSE F - 18 (FDG) INJECTION
6.9000 | Freq: Once | INTRAVENOUS | Status: AC | PRN
  Administered 2018-11-21: 07:00:00 6.9 via INTRAVENOUS

## 2018-11-22 ENCOUNTER — Encounter: Payer: Self-pay | Admitting: Hematology

## 2018-11-22 ENCOUNTER — Telehealth: Payer: Self-pay | Admitting: *Deleted

## 2018-11-22 ENCOUNTER — Other Ambulatory Visit: Payer: Self-pay | Admitting: Hematology

## 2018-11-22 NOTE — Telephone Encounter (Signed)
Malachy Mood, could you call her to see if she can come to see me tomorrow at 11:40am or 4pm? I will review her PET scan and discuss changing her treatment. Thanks   Truitt Merle MD

## 2018-11-22 NOTE — Telephone Encounter (Signed)
[name] left a message requesting results of PET scan and refill of Ambien

## 2018-11-23 ENCOUNTER — Inpatient Hospital Stay: Payer: Medicaid Other | Attending: Hematology | Admitting: Hematology

## 2018-11-23 ENCOUNTER — Telehealth: Payer: Self-pay

## 2018-11-23 ENCOUNTER — Encounter: Payer: Self-pay | Admitting: Hematology

## 2018-11-23 DIAGNOSIS — C50911 Malignant neoplasm of unspecified site of right female breast: Secondary | ICD-10-CM

## 2018-11-23 DIAGNOSIS — C7951 Secondary malignant neoplasm of bone: Secondary | ICD-10-CM

## 2018-11-23 MED ORDER — ZOLPIDEM TARTRATE 5 MG PO TABS: 5.0000 mg | ORAL_TABLET | Freq: Every evening | ORAL | 0 refills | Status: DC | PRN

## 2018-11-23 MED ORDER — TUCATINIB 50 MG PO TABS: 300.0000 mg | ORAL_TABLET | Freq: Two times a day (BID) | ORAL | 0 refills | Status: DC

## 2018-11-23 NOTE — Progress Notes (Signed)
[place]   [phone]) [phone] [phone]   Clinic Follow up Note   Patient Care Team: [name], [name], [name] as PCP - General (Physician Assistant) [name], MD as Consulting Physician (Orthopedic Surgery)   I connected with [name] on 11/23/2018 at  3:20 PM EST by telephone visit and verified that I am speaking with the correct person using two identifiers.  I discussed the limitations, risks, security and privacy concerns of performing an evaluation and management service by telephone and the availability of in person appointments. I also discussed with the patient that there may be a patient responsible charge related to this service. The patient expressed understanding and agreed to proceed.   Other persons participating in the visit and their role in the encounter:  Interpretor third-party   Patient's location:  Her home  Provider's location:  My Office  CHIEF COMPLAINT: F/u ofmetastatic breast cancer  SUMMARY OF ONCOLOGIC HISTORY: Oncology History Overview Note   Cancer Staging No matching staging information was found for the patient.     [name], right ([place])  11/1997 Cancer Diagnosis   Patient had 1.4 cm poorly differentiated right breast carcinoma diagnosed in Nov 1999 at age 59, 1/7 axillary nodes involved, ER/PR and HER 2 positive. She had mastectomy with the 7 axillary node evaluation, 4 cycles of adriamycin/ cytoxan followed by taxotere, then five years of tamoxifen thru June 2005 (no herceptin in 1999). She had bilateral oophorectomy in May 2005. She was briefly on aromatase inhibitor after tamoxifen, but discontinued this herself due to poor tolerance.   04/04/2015 Genetic Testing   Negative genetic testing on the breast/ovarian cancer pnael.  The Breast/Ovarian gene panel offered by [facility] includes sequencing and rearrangement analysis for the following 20 genes:  ATM, BARD1, BRCA1,  BRCA2, BRIP1, CDH1, CHEK2, EPCAM, FANCC, MLH1, MSH2, MSH6, NBN, PALB2, PMS2, PTEN, RAD51C, RAD51D, TP53, and XRCC2.   06/26/2015 Pathology Results   Initial Path Report Bone, curettage, Left femur met, intramedullary subtroch tissue - METASTATIC ADENOCARCINOMA.  Estrogen Receptor: 95%, POSITIVE, STRONG STAINING INTENSITY Progesterone Receptor: 2%, POSITIVE, STRONG  HER2 (+), IHC 3+   06/26/2015 Surgery   Biopsy of metastatic tissue and stabilization with Affixus trochanteric nail, proximal and distal interlock of left femur by Dr. [name]    06/28/2015 Imaging   CT Chest w/ Contrast 1. Extensive right internal mammary chain lymphadenopathy consistent with metastatic breast cancer. 2. Lytic metastases involving the posterior elements at T1 with probable nondisplaced pathologic fracture of the spinous process. 3. No other evidence of thoracic metastatic disease. 4. Trace bilateral pleural effusions with associated bibasilar atelectasis. 5. Indeterminate right thyroid nodule, not previously imaged. This could be evaluated with thyroid ultrasound as clinically warranted.   07/01/2015 Imaging   Bone Scan 1. Increased activity noted throughout the left femur. Although metastatic disease cannot be excluded, these changes are most likely secondary to prior surgery. 2. No other focal abnormalities identified to suggest metastatic disease.   07/12/2015 - 07/26/2015 Radiation Therapy   Left femur, 30 Gy in 10 fractions by Dr. Sondra Come    07/14/2015 Initial Diagnosis   [name], right ([place])   07/19/2015 Imaging   Initial PET Scan 1. Severe multifocal osseous metastatic disease, much greater than anticipated based on prior imaging. 2. Multifocal nodal metastases involving the right internal mammary, prevascular and subpectoral lymph nodes. No axillary pulmonary involvement identified. 3. No distant extra osseous metastases.  07/30/2015 - 11/29/2015 Chemotherapy    Docetaxel, Herceptin and Perjeta every 3 weeks X6 cycles, pt tolerated moderately well, restaging scan showed excellent response    09/18/2015 Imaging   CT Head w/wo Contrast 1. No acute intracranial abnormality or significant interval change. 2. Stable minimal periventricular white matter hypoattenuation on the right. This may reflect a remote ischemic injury. 3. No evidence for metastatic disease to the brain. 4. No focal soft tissue lesion to explain the patient's tenderness.   10/30/2015 Imaging   CT Abdomen Pelvis w/ Contrast 1. Few mildly prominent fluid-filled loops of small bowel scattered throughout the abdomen, with intraluminal fluid density within the distal colon. Given the provided history, findings are suggestive of acute enteritis/diarrheal illness. 2. No other acute intra-abdominal or pelvic process identified. 3. Widespread osseous metastatic disease, better evaluated on most recent PET-CT from 07/19/2015. No pathologic fracture or other complication. 4. 11 mm hypodensity within the left kidney. This lesion measures intermediate density, and is indeterminate. While this lesion is similar in size relative to recent studies, this is increased in size relative to prior study from 2012. Further evaluation with dedicated renal mass protocol CT and/or MRI is recommended for complete characterization.   12/2015 - 05/14/2016 Chemotherapy   Maintenance Herceptin and pejeta every 3 weeks stopped on 05/14/16 due to disease progression   12/25/2015 Imaging   Restaging PET Scan 1. Markedly improved skeletal activity, with only several faint foci of residual accentuated metabolic activity, but resolution of the vast majority of the previously extensive osseous metastatic disease. 2. Resolution of the prior right internal mammary and right prevascular lymph nodes. 3. Residual subcutaneous edema and edema along fascia planes in the left upper thigh. This remains somewhat more than I would expect  for placement of an IM nail 6 weeks ago. If there is leg swelling further down, consider Doppler venous ultrasound to rule out left lower extremity DVT. I do not perceive an obvious difference in density between the pelvic and common femoral veins on the noncontrast CT data. 4. Chronic right maxillary and right sphenoid sinusitis.   01/16/2016 - 10/20/2017 Anti-estrogen oral therapy   Letrozole 2.5 mg daily. She was switched to exemestane due to disease progression on 02/08/17. Stopped on 10/20/2017 when treatment changed to chemo    03/01/2016 Miscellaneous   Patient presented to ED following a fall; she reports she landed on her back and is now experiencing back pain. The patient was evaluated and discharge home same day with pain control.   05/01/2016 Imaging   CT CAP IMPRESSION: 1. Stable exam.  No new or progressive disease identified. 2. No evidence for residual or recurrent adenopathy within the chest. 3. Stable bone metastasis.   05/01/2016 Imaging   BONE SCAN IMPRESSION: 1. Small focal uptake involving the anterior rib ends of the left second and right fourth ribs, new since the prior bone scan. The location of this uptake is more suggestive of a traumatic or inflammatory etiology as opposed to metastatic disease. No other evidence of metastatic disease. 2. There are other areas of stable uptake which are most likely degenerative/reactive, including right shoulder and cervical spine uptake and uptake along the right femur adjacent to the ORIF hardware.   05/20/2016 Imaging   NM PET Skull to Thigh IMPRESSION: 1. There multiple metastatic foci in the bony pelvis, including some new abnormal foci compare to the prior PET-CT, compatible with mildly progressive bony metastatic disease. Also, a left T11 vertebral hypermetabolic metastatic lesion is observed, new  compared to the prior exam. 2. No active extraosseous malignancy is currently identified. 3. Right anterior fourth rib  healing fracture, appears be   06/04/2016 - 01/08/2017 Chemotherapy   Second line chemotherapy Kadcyla every 3 weeks started on 06/04/16 and stopped on 01/08/17 due to mixed response.     10/05/2016 Imaging   CT CAP and Bone Scan:  Bones: left intramedullary rod and left femoral head neck screw in place. In the proximal diaphysis of the left femur there is cortical thickening and irregularity. No lytic or blastic bone lesions. No fracture or vertebral endplate destruction.   No definite evidence of recurrent disease in the chest, abdomen, or pelvis.   10/28/2016 Imaging   CT A/P IMPRESSION: No acute process demonstrated in the abdomen or pelvis. No evidence of bowel obstruction or inflammation.   11/04/2016 Imaging   DG foot complete left: IMPRESSION: No fracture or dislocation.  No soft tissue abnormality   11/23/2016 Imaging   CT RIGHT FEMUR IMPRESSION: Negative CT scan of the right thigh.  No visible metastatic disease.  CT LEFT FEMUR IMPRESSION: 1. Postsurgical changes related to prior cephalomedullary rod fixation of the left femur with linear lucency along the anterolateral proximal femoral cortex at level of the lesser trochanter, suspicious for nondisplaced fracture. 2. Slightly permeative appearance of the proximal left femoral cortex at the site of known prior osseous metastases is largely unchanged. 3. Unchanged patchy lucency and sclerosis along the anterior acetabulum, which appears to correspond with an area of faint uptake on the prior PET-CT, suspicious for osseous metastasis. These results will be called to the ordering clinician or representative by the Radiologist Assistant, and communication documented in the PACS or zVision Dashboard.     01/27/2017 Imaging   IMPRESSION: 1. Mixed response to osseous metastasis. Some hypermetabolic lesions are new and increasingly hypermetabolic, while another index lesion is decreasingly hypermetabolic. 2. No  evidence of hypermetabolic soft tissue metastasis.    02/08/2017 - 10/19/2017 Antibody Plan   -Herceptin every 3 week since 02/08/17 -Oral Lanpantinib 1550m and decreased to 5032mdue to diarrhea and depression. Due to disease progression she swithced to Verzenio 15043mn 05/04/17. She did not toelrate well so she was switched to Ibrance 100m78m 05/31/17. Due to her neutropenia, Ibrance dose reduced to 75 mg daily, 3 weeks on, one-week off, stopped on 10/20/2017 due to disease progression.    04/30/2017 PET scan   IMPRESSION: 1. Primarily increase in hypermetabolism of osseous metastasis. An isolated right acetabular lesion is decreased in hypermetabolism since the prior exam. 2. No evidence of hypermetabolic soft tissue metastasis. 3. Similar non FDG avid right-sided thyroid nodule, favoring a benign etiology. Recommend attention on follow-up.   08/30/2017 PET scan   08/30/2017 PET Scan  IMPRESSION: 1. Worsening osseous metastatic disease. 2. Focal hypermetabolism in the central spinal canal the L1 level, similar to the prior exam. Metastatic implant cannot be excluded. 3. Slight enlargement of a right thyroid nodule which now appears more cystic in character. Consider further evaluation with thyroid ultrasound. If patient is clinically hyperthyroid, consider nuclear medicine thyroid uptake and scan   08/30/2017 Progression   08/30/2017 PET Scan  IMPRESSION: 1. Worsening osseous metastatic disease. 2. Focal hypermetabolism in the central spinal canal the L1 level, similar to the prior exam. Metastatic implant cannot be excluded. 3. Slight enlargement of a right thyroid nodule which now appears more cystic in character. Consider further evaluation with thyroid ultrasound. If patient is clinically hyperthyroid, consider nuclear medicine thyroid uptake  and scan   09/29/2017 - 10/11/2017 Radiation Therapy   Pt received 27 Gy in 9 fractions directed at the areas of significant uptake noted  on recent bone scan the right pelvis and right proximal femur, with Dr, Sondra Come    10/20/2017 - 04/07/2018 Chemotherapy   -Herceptin every 3 weeks starting 02/08/17, perjeta added on 10/20/2017  -weekly Taxol started on 10/21/2017. Will reduce Taxol to 2 weeks on, 1 week off starting on 02/04/17.  Stoped due to mild disease progression   10/27/2017 Imaging   10/27/2017 CT AP IMPRESSION: 1. No acute abnormality. No evidence of bowel obstruction or acute bowel inflammation. Normal appendix. 2. Stable patchy sclerotic osseous metastases throughout the visualized skeleton. No new or progressive metastatic disease in the abdomen or pelvis.   01/11/2018 Imaging   Whole Body Bone Scan 01/11/18  IMPRESSION: 1. Multifocal osseous metastases as described. 2. Right scapular metastasis. 3. Right superior thoracic spinous process. 4. Right sacral ala and right L5 metastases. 5. Right acetabular metastasis. 6. Right proximal femur metastasis. 7. Diffuse metastases throughout the left femur. 8. Anterior right fourth rib metastasis.   01/11/2018 Imaging   CT CAP W Contrast 01/11/18  IMPRESSION: 1. Similar-appearing osseous metastatic disease. 2. No evidence for progressed metastatic disease in the chest, abdomen or pelvis.   03/30/2018 Imaging   Bone Scan 03/30/18  IMPRESSION: Stable multifocal bony metastatic disease.   03/30/2018 Imaging    CT CAP W Contrast 03/30/18 IMPRESSION: 1. New mild contiguous wall thickening with associated mucosal hyperenhancement throughout the left colon and rectum, suggesting nonspecific infectious or inflammatory proctocolitis. Differential includes C diff colitis. 2. Stable faint patchy sclerosis in the iliac bones and lumbar vertebral bodies. No new or progressive osseous metastatic disease by CT. Please note that the osseous lesions are relatively occult by CT and would be better evaluated by PET-CT, as clinically warranted. 3. No evidence of extra  osseous metastatic disease.  These results will be called to the ordering clinician or representative by the Radiologist Assistant, and communication documented in the PACS or zVision Dashboard.   04/15/2018 - 06/16/2018 Chemotherapy   Enhertu q3weeks starting 04/15/18. Stopped Enhertu after cycle 3 on 05/27/18 due to poor toleration due to poor toleration with anorexia, weight loss, N&V, diarrhea   04/21/2018 PET scan   PET 04/21/18  IMPRESSION: 1. Overall marked interval improvement in bony hypermetabolic disease. All of the previously identified hypermetabolic osseous metastases have decreased in hypermetabolism in the interval. There is a new tiny focus of hypermetabolism in the right aspect of the L3 vertebral body that appears to correspond to a new 8 mm lucency, raising concern for new metastatic disease. Close attention on follow-up recommended. 2. Focal hypermetabolism in the central spinal canal at the level of L1 previously has resolved. 3. No hypermetabolic soft tissue disease on today's study. 4. Right thyroid nodule seen previously has decreased in the interval.    06/16/2018 Imaging   CT LUMBAR SPINE WO CONTRAST 06/16/18 IMPRESSION: 1. Transitional lumbosacral anatomy. Correlation with radiographs is recommended prior to any operative intervention. 2. Suspicion of a right L3 vertebral body metastasis on the April PET-CT is not evident by CT. Stable degenerative versus metastatic lesion at the right L5 superior endplate. Small but increased right iliac bone metastasis since March. Lumbar MRI would best characterize lumbar metastatic disease (without contrast if necessary). 3. Suspect L2-L3 left subarticular segment disc herniation. Query left L2 radiculitis. 4. Stable appearance of rightward L4-L5 disc degeneration which could affect the right  foramen and right lateral recess.   06/17/2018 -  Chemotherapy   Restart Taxol 2 weeks on/1 week off with Herceptin and Perjeta  q3weeks on 06/17/18. Stopped Taxol after 11/16/18 due to disease progression to L3.    07/06/2018 PET scan   PET  IMPRESSION: 1. Near total resolution of hypermetabolic activity associated with skeletal metastasis. Very mild activity associated with the LEFT iliac bone which is similar to background blood pool activity. 2. No new or progressive disease.   11/21/2018 PET scan   IMPRESSION: 1. New focal hypermetabolic lesion eccentric to the right in the L3 vertebral body, maximum SUV 6.1 compatible with a new focus of active malignancy. 2. Very low-grade activity similar to blood pool in previous existing mildly sclerotic metastatic lesions including the left iliac bone lesion. Some of the mildly sclerotic lesions are not hypermetabolic compatible with effectively treated bony metastatic disease. 3. No hypermetabolic soft tissue lesions are identified.      Chemotherapy   PENDING Tucatinib (TUKYSA) 6 tabs BID as a next line therapy starting 11/30/18 -Continue Herceptin q3weeks       CURRENT THERAPY:  -PENDING Tucatinib (TUKYSA) 6 tabs BID as a next line therapy starting 11/30/18 -Continue Herceptin q3weeks   INTERVAL HISTORY:  [name] is here for a follow up of recent scan. She has phone ASL interpretor for phone call. She notes she is doing well. She notes she feels stable with no new changes or back pain. She notes being concerned with her prognosis. She notes she has not been sleeping well since she ran out of Ambien. She was taking 2 tabs as needed.    REVIEW OF SYSTEMS:   Constitutional: Denies fevers, chills or abnormal weight loss (+) trouble sleeping  Eyes: Denies blurriness of vision Ears, nose, mouth, throat, and face: Denies mucositis or sore throat Respiratory: Denies cough, dyspnea or wheezes Cardiovascular: Denies palpitation, chest discomfort or lower extremity swelling Gastrointestinal:  Denies nausea, heartburn or change in bowel habits Skin: Denies  abnormal skin rashes Lymphatics: Denies new lymphadenopathy or easy bruising Neurological:Denies numbness, tingling or new weaknesses Behavioral/Psych: Mood is stable, no new changes  All other systems were reviewed with the patient and are negative.  MEDICAL HISTORY:  Past Medical History:  Diagnosis Date   Abnormal Pap smear    Anxiety    Breast cancer ([place])    Cancer ([place]) 1999   breast-s/p mastectomy, chemo, rad   CIN I (cervical intraepithelial neoplasia I) 2003   by colpo   Congenital deafness    Deaf    Depression    Port-A-Cath in place 2017   for chemotherapy   Radiation 07/12/15-07/26/15   left femur 30 Gy   S/P bilateral oophorectomy     SURGICAL HISTORY: Past Surgical History:  Procedure Laterality Date   ABDOMINAL HYSTERECTOMY     INTRAMEDULLARY (IM) NAIL INTERTROCHANTERIC Left 06/26/2015   Procedure: LEFT BIOMET LONG AFFIXS NAIL;  Surgeon: [name], MD;  Location: [facility];  Service: Orthopedics;  Laterality: Left;   IR GENERIC HISTORICAL  08/06/2015   IR US GUIDE VASC ACCESS LEFT 08/06/2015 Aletta Edouard, MD [facility]   IR GENERIC HISTORICAL  08/06/2015   IR FLUORO GUIDE CV LINE LEFT 08/06/2015 Aletta Edouard, MD [facility]   IR GENERIC HISTORICAL  03/05/2016   IR CV LINE INJECTION 03/05/2016 Markus Daft, MD [facility]   LEFT HEART CATH AND CORONARY ANGIOGRAPHY N/A 12/13/2017   Procedure: LEFT HEART CATH AND CORONARY ANGIOGRAPHY;  Surgeon: Leonie Man, MD;  Location: [facility];  Service: Cardiovascular;  Laterality: N/A;   MASTECTOMY     right breast   MASTECTOMY     OVARIAN CYST REMOVAL     RADIOLOGY WITH ANESTHESIA Left 06/25/2015   Procedure: MRI OF LEFT HIP WITH OR WITHOUT CONTRAST;  Surgeon: Medication Radiologist, MD;  Location: [facility];  Service: Radiology;  Laterality: Left;  DR. MCINTYRE/MRI   TUBAL LIGATION      I have reviewed the social history and family history with the patient and they are unchanged from  previous note.  ALLERGIES:  is allergic to promethazine hcl; chlorhexidine; and diphenhydramine hcl.  MEDICATIONS:  Current Outpatient Medications  Medication Sig Dispense Refill   albuterol (PROAIR HFA) 108 (90 Base) MCG/ACT inhaler Inhale 2 puffs into the lungs every 6 (six) hours as needed for wheezing or shortness of breath.     albuterol (PROVENTIL HFA;VENTOLIN HFA) 108 (90 Base) MCG/ACT inhaler Inhale 2 puffs into the lungs every 6 (six) hours as needed for wheezing or shortness of breath. 1 Inhaler 0   ALPRAZolam (XANAX) 0.25 MG tablet Take 1 tablet (0.25 mg total) by mouth 2 (two) times daily as needed for anxiety. 30 tablet 0   dexamethasone (DECADRON) 4 MG tablet Take 1 tablet (4 mg total) by mouth daily. 20 tablet 0   diphenoxylate-atropine (LOMOTIL) 2.5-0.025 MG tablet Take 1-2 tablets by mouth 4 (four) times daily as needed for diarrhea or loose stools. 30 tablet 1   ergocalciferol (VITAMIN D2) 1.25 MG (50000 UT) capsule Take 1 capsule (50,000 Units total) by mouth once a week. 8 capsule 0   lidocaine-prilocaine (EMLA) cream Apply 1 application topically as needed. Apply to Porta-Cath 1-2 hours prior to access as directed. (Patient taking differently: Apply 1 application topically as needed (To Porta-Cath 1-2 hours prior to access as directed). ) 90 g 2   magnesium oxide (MAG-OX) 400 (241.3 Mg) MG tablet Take 1 tablet (400 mg total) by mouth daily. 30 tablet 3   methocarbamol (ROBAXIN) 500 MG tablet Take 1 tablet (500 mg total) by mouth 2 (two) times daily. (Patient taking differently: Take 500 mg by mouth 2 (two) times daily as needed for muscle spasms. ) 20 tablet 0   omeprazole (PRILOSEC) 20 MG capsule Take 1 capsule (20 mg total) by mouth daily. 30 capsule 0   ondansetron (ZOFRAN) 8 MG tablet Take 1 tablet (8 mg total) by mouth every 8 (eight) hours as needed for nausea or vomiting. 45 tablet 1   oxyCODONE (ROXICODONE) 5 MG immediate release tablet Take 1 tablet (5 mg  total) by mouth every 8 (eight) hours as needed for severe pain. 60 tablet 0   potassium chloride (K-DUR) 10 MEQ tablet Take 2 tablets (20 mEq total) by mouth 2 (two) times daily. (Patient taking differently: Take 30 mEq by mouth daily. ) 180 tablet 3   prochlorperazine (COMPAZINE) 10 MG tablet Take 1 tablet (10 mg total) by mouth every 6 (six) hours as needed for nausea or vomiting. 60 tablet 2   tucatinib (TUKYSA) 50 MG tablet Take 6 tablets (300 mg total) by mouth 2 (two) times daily. Take every 12 hr at the same time each day with or without a meal. 120 tablet 0   zolpidem (AMBIEN) 5 MG tablet Take 1-2 tablets (5-10 mg total) by mouth at bedtime as needed. 60 tablet 0   No current facility-administered medications for this visit.    Facility-Administered Medications Ordered  in Other Visits  Medication Dose Route Frequency Provider Last Rate Last Dose   potassium chloride SA (K-DUR) CR tablet 40 mEq  40 mEq Oral Once Truitt Merle, MD       sodium chloride 0.9 % 1,000 mL with potassium chloride 10 mEq infusion   Intravenous Continuous Livesay, Lennis P, MD   Stopped at 09/10/15 1653   sodium chloride 0.9 % injection 10 mL  10 mL Intravenous PRN Livesay, Lennis P, MD        PHYSICAL EXAMINATION: ECOG PERFORMANCE STATUS: 2 - Symptomatic, <50% confined to bed  No vitals taken today, Exam not performed today   LABORATORY DATA:  I have reviewed the data as listed CBC Latest Ref Rng & Units 11/16/2018 11/09/2018 10/28/2018  WBC 4.0 - 10.5 K/uL 3.6(L) 3.1(L) 4.1  Hemoglobin 12.0 - 15.0 g/dL 10.6(L) 10.8(L) 10.0(L)  Hematocrit 36.0 - 46.0 % 31.1(L) 31.3(L) 29.5(L)  Platelets 150 - 400 K/uL 203 189 183     CMP Latest Ref Rng & Units 11/16/2018 11/09/2018 10/28/2018  Glucose 70 - 99 mg/dL 77 98 86  BUN 6 - 20 mg/dL _0 Creatinine 0.44 - 1.00 mg/dL 0.83 0.95 0.81  Sodium 135 - 145 mmol/L 140 146(H) 143  Potassium 3.5 - 5.1 mmol/L 3.5 3.2(L) 3.2(L)  Chloride 98 - 111 mmol/L 107 107  107  CO2 22 - 32 mmol/L _1 Calcium 8.9 - 10.3 mg/dL 9.1 8.8(L) 8.5(L)  Total Protein 6.5 - 8.1 g/dL 6.3(L) 6.1(L) 6.0(L)  Total Bilirubin 0.3 - 1.2 mg/dL 1.7(H) 1.3(H) 1.6(H)  Alkaline Phos 38 - 126 U/L 52 55 51  AST 15 - 41 U/L 13(L) 13(L) 15  ALT 0 - 44 U/L _2 RADIOGRAPHIC STUDIES: I have personally reviewed the radiological images as listed and agreed with the findings in the report. No results found.   ASSESSMENT & PLAN:  JERLENE ROCKERS is a 53 y.o. 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 She had right mastectomy, 4 cycles of AC-T and 5 years of Tamoxifen/AI.  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 She has received Target RT.  She had excellent response to First-line Docetaxel/Herceptin/Perjeta and AI but progressed on maintenance therapy. She had mixed response on second line Kadcyla, progressed on third-line Ibrance/Herceptin and poorly tolerated Enhertu.  -She is currently on restarted Taxol/Herceptin/Perjeta q3weeks since 06/17/18. Perjeta was stopped after C5 due to diarrhea.  -I personally reviewed and discussed her PET from 11/21/18 which shows new focal hypermetabolic lesion in L3 vertebral body. Otherwise stable disease in bones without significant hypermetabolic activity. Overall she has mild disease progression.  -I will stop Taxol due to her recent disease progression  -For next line treatment, I discussed the newly FDA approved oral HER2 antibody Tucatinib (TUKYSA) 6 tabs BID as a next line therapy. The benefit and potential side effects were discussed with her,  especially diarrhea, nausea, fatigue, increasing joint, decreased appetite, skin rash, anemia, hepatotoxicity, etc.  She is interested. She would continue Herceptin q3weeks. She is agreeable to try.  Due to her previous poor tolerance to oral HER-2  antibody, I will start her on low-dose at 172m bid, and gradually increase to full dose if she tolerates well. -we discussed that tucatinib is usually given with Xeloda, due to her poor tolerance  to Xeloda in the past, oxycodone for first month, may consider low dose Xeloda from second month.  -We again discussed the goal of therapy is palliative, to prolong her life. -I ordered a CT brain with contrast for brain metastasis surveillance, was denied by her insurance -plan to start Tucatinib next Wednesday    2.Body pain, secondary to bone mets, Hypocalcemia, leg cramps  -Since her cancer diagnosis,she has had b/l hip, shoulder and chest painat different time points, likely related to her bone mets -She declined Gabapentin given risk of suicidal thoughts. -She is being treated with Zometa q12month. She does have hypocalcemia with leg cramps secondary to Zometa.  -She will continue oral calcium and Vit D.  -Her PET from 07/06/18 shows only mild uptake in left iliac bone, she otherwise has very good improvement of her bone mets.Will monitor for need of palliative radiation. -Her back and chest pain now resolved. Her main pain is in upper left leg. She is currently on oxycodone 1-2 times a day now.  3.Congenital deafness  4.Mild anemia, secondary to chemotherapy and malignancy -She will start with oral iron daily for 1-2 months and then stop. She will also start daily women's vitamin.  5.Hypokalemia, Diarrhea -She developed diarrhea s/p cycle 3 Perjeta. Has been d/c  -I started her on Mag 4037mdaily and Oral potassium 1060m2-3 times a day. Given she does not take potassium TID consistently I encouraged her to increase potassium in her diet. She is agreeable.    6.Goal of care discussion -she understand her goal of care is palliative to prolong her life -she is full code for now  7.Insomnia -She cannot take Benadryl due to allergy reaction. She has tried trazodone  with no relief.  -She is on Xanax and Ambien 1-2 tabs nightly.   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 She declined using ice bags using infusion -Her neuropathy is currently mild and stable in her feet and LE, will monitor on treatment.   9. Vitamin D Deficiency  -As seen on 10/07/18 labs at 18.4.  -On 11/09/18 I started her on high dose 50,000 Unit oral Vitamin D weekly for 2 months.  -Once she completes she will return to Calcium with Vitamin D.    PLAN: -PET scan reviewed today, disease progression with new lesion in L3 -I refilled her Ambien today  -Lab, flush, f/u and Herceptin on 11/18, will stop Taxol  -I called in tucatinib today, plan to start on 11/18    No problem-specific Assessment & Plan notes found for this encounter.   No orders of the defined types were placed in this encounter.  I discussed the assessment and treatment plan with the patient. The patient was provided an opportunity to ask questions and all were answered. The patient agreed with the plan and demonstrated an understanding of the instructions.  The patient was advised to call back or seek an in-person evaluation if the symptoms worsen or if the condition fails to improve as anticipated.  I provided 25 minutes of non face-to-face telephone visit time during this encounter, and > 50% was spent counseling as documented under my assessment & plan.    YanTruitt MerleD 11/23/2018   I, AmoJoslyn Devonm acting as scribe for YanTruitt MerleD.   I have reviewed the above documentation for accuracy and completeness, and I agree with the above.

## 2018-11-23 NOTE — Telephone Encounter (Signed)
Left message for patient regarding PET scan results, per Dr. Burr Medico she would like her to come in today at 11:40 or 3:15 to discuss results. Asked her to call back and let me know which time works for her.

## 2018-11-23 NOTE — Telephone Encounter (Signed)
Patient called back stating she cannot come into the office this afternoon as she has her grandchildren.  She is requesting to be called by Dr. Burr Medico on the phone. Informed her she will call around 3:20.

## 2018-11-24 ENCOUNTER — Telehealth: Payer: Self-pay | Admitting: Hematology

## 2018-11-24 ENCOUNTER — Telehealth: Payer: Self-pay | Admitting: Pharmacist

## 2018-11-24 ENCOUNTER — Ambulatory Visit (HOSPITAL_COMMUNITY)
Admission: RE | Admit: 2018-11-24 | Discharge: 2018-11-24 | Disposition: A | Discharge status: Home / Self Care | Payer: Medicaid Other | Source: Ambulatory Visit | Attending: Hematology | Admitting: Hematology

## 2018-11-24 ENCOUNTER — Other Ambulatory Visit: Payer: Self-pay

## 2018-11-24 DIAGNOSIS — C50911 Malignant neoplasm of unspecified site of right female breast: Secondary | ICD-10-CM | POA: Insufficient documentation

## 2018-11-24 DIAGNOSIS — I081 Rheumatic disorders of both mitral and tricuspid valves: Secondary | ICD-10-CM | POA: Diagnosis not present

## 2018-11-24 DIAGNOSIS — C7951 Secondary malignant neoplasm of bone: Secondary | ICD-10-CM | POA: Insufficient documentation

## 2018-11-24 DIAGNOSIS — R079 Chest pain, unspecified: Secondary | ICD-10-CM | POA: Insufficient documentation

## 2018-11-24 MED ORDER — TUCATINIB 50 MG PO TABS: ORAL_TABLET | ORAL | 0 refills | Status: DC

## 2018-11-24 MED FILL — TUKYSA 50 MG TABS: 50 | 28 days supply | Qty: 252 | Fill #0

## 2018-11-24 NOTE — Telephone Encounter (Signed)
Oral Oncology Pharmacist Encounter  Received new prescription for Tukysa (tucatinib) for the treatment of heavily pretreated, metastatic, HR+, HER2+ breast cancer in conjunction with infusional trastuzumab and oral Xeloda (capecitabine), planned duration until disease progression or unacceptable toxicity.  Per MD, due to previous poor tolerance to combination therapies plan to initiate tucatinib at 150 mg by mouth 2 times daily, with increase of 100 mg daily over 3-4 weeks to target dosing of 300 mg by mouth 2 times daily. Trastuzumab given IV once every 3 weeks will continue. Plan for addition of oral capecitabine in the 2nd month of tucatinib based on toleration.  Labs from 11/16/18 assessed, OK for treatment initiation. Noted Tbili at 1.7 mg/dL (>1.5 x ULN), no dose adjustment for hepatic dysfunction until Child-Pugh class C disease  Current medication list in Epic reviewed, DDIs with tucatinib identified: Tucatinib is a strong inhibitor of CYP3A4 leading to possible decreased metabolism and increased systemic exposure to patient's oxycodone, alprazolam, and zolpidem. Patient will be counseled about possible increased efficacy of these interacting agents, no change to current therapy is indicated at this time.  Prescription has been e-scribed to the [facility] for benefits analysis and approval on 11/11 by MD. I sent a new prescription today reflecting the dose up-titration schedule outlined by MD.  Patient with Medicaid prescription coverage. Anticipate a $3 copayment, this has been affordable to patient in the past.  Oral Oncology Clinic will continue to follow for initial counseling and start date.  Johny Drilling, PharmD, BCPS, BCOP  11/24/2018 11:07 AM Oral Oncology Clinic [phone]

## 2018-11-24 NOTE — Progress Notes (Signed)
  Echocardiogram 2D Echocardiogram has been performed.  [name] 11/24/2018, 11:01 AM

## 2018-11-24 NOTE — Telephone Encounter (Signed)
No los per 11/11. °

## 2018-11-28 NOTE — Telephone Encounter (Signed)
Oral Chemotherapy Pharmacist Encounter   Attempted to reach patient to provide update and offer for initial counseling on oral medication: Tukysa.  No answer.  I did not leave a voicemail for patient as I was speaking with an interpreter. I will try back later this afternoon to discuss details of medication acquisition and initial counseling session.  Johny Drilling, PharmD, BCPS, BCOP  11/28/2018   12:17 PM Oral Oncology Clinic [phone]

## 2018-11-28 NOTE — Telephone Encounter (Signed)
Oral Chemotherapy Pharmacist Encounter   Attempted to reach patient to provide update and offer for initial counseling on oral medication: Tukysa..  No answer.  I did leave a voicemail this time for patient to call back to discuss details of medication acquisition and initial counseling session.  Johny Drilling, PharmD, BCPS, BCOP  11/28/2018   2:00 PM Oral Oncology Clinic [phone]

## 2018-11-29 NOTE — Telephone Encounter (Signed)
Oral Chemotherapy Pharmacist Encounter   Attempted to reach patient to provide update and offer for initial counseling on oral medication: Tukysa.  No answer.  Left voicemail for patient to call back to discuss details of medication acquisition and initial counseling session.  Johny Drilling, PharmD, BCPS, BCOP  11/29/2018   3:06 PM Oral Oncology Clinic [phone]

## 2018-11-30 ENCOUNTER — Inpatient Hospital Stay: Payer: Medicaid Other

## 2018-11-30 ENCOUNTER — Other Ambulatory Visit: Payer: Self-pay

## 2018-11-30 ENCOUNTER — Inpatient Hospital Stay (HOSPITAL_BASED_OUTPATIENT_CLINIC_OR_DEPARTMENT_OTHER): Payer: Medicaid Other | Admitting: Hematology

## 2018-11-30 ENCOUNTER — Encounter: Payer: Self-pay | Admitting: Hematology

## 2018-11-30 ENCOUNTER — Telehealth: Payer: Self-pay | Admitting: Hematology

## 2018-11-30 VITALS — BP 129/77 | HR 76 | Temp 97.8°F | Resp 18 | Ht 65.0 in | Wt 140.2 lb

## 2018-11-30 DIAGNOSIS — C50911 Malignant neoplasm of unspecified site of right female breast: Secondary | ICD-10-CM | POA: Diagnosis not present

## 2018-11-30 DIAGNOSIS — G4700 Insomnia, unspecified: Secondary | ICD-10-CM | POA: Diagnosis not present

## 2018-11-30 DIAGNOSIS — C7951 Secondary malignant neoplasm of bone: Secondary | ICD-10-CM

## 2018-11-30 DIAGNOSIS — Z95828 Presence of other vascular implants and grafts: Secondary | ICD-10-CM

## 2018-11-30 DIAGNOSIS — Z5112 Encounter for antineoplastic immunotherapy: Secondary | ICD-10-CM | POA: Diagnosis not present

## 2018-11-30 LAB — CBC WITH DIFFERENTIAL (CANCER CENTER ONLY)
Abs Immature Granulocytes: 0.04 10*3/uL (ref 0.00–0.07)
Basophils Absolute: 0 10*3/uL (ref 0.0–0.1)
Basophils Relative: 0 %
Eosinophils Absolute: 0 10*3/uL (ref 0.0–0.5)
Eosinophils Relative: 1 %
HCT: 31 % — ABNORMAL LOW (ref 36.0–46.0)
Hemoglobin: 10.5 g/dL — ABNORMAL LOW (ref 12.0–15.0)
Immature Granulocytes: 1 %
Lymphocytes Relative: 22 %
Lymphs Abs: 0.8 10*3/uL (ref 0.7–4.0)
MCH: 30.3 pg (ref 26.0–34.0)
MCHC: 33.9 g/dL (ref 30.0–36.0)
MCV: 89.3 fL (ref 80.0–100.0)
Monocytes Absolute: 0.5 10*3/uL (ref 0.1–1.0)
Monocytes Relative: 12 %
Neutro Abs: 2.5 10*3/uL (ref 1.7–7.7)
Neutrophils Relative %: 64 %
Platelet Count: 182 10*3/uL (ref 150–400)
RBC: 3.47 MIL/uL — ABNORMAL LOW (ref 3.87–5.11)
RDW: 14.1 % (ref 11.5–15.5)
WBC Count: 3.8 10*3/uL — ABNORMAL LOW (ref 4.0–10.5)
nRBC: 0 % (ref 0.0–0.2)

## 2018-11-30 LAB — CMP (CANCER CENTER ONLY)
ALT: 16 U/L (ref 0–44)
AST: 15 U/L (ref 15–41)
Albumin: 3.7 g/dL (ref 3.5–5.0)
Alkaline Phosphatase: 52 U/L (ref 38–126)
Anion gap: 13 (ref 5–15)
BUN: 9 mg/dL (ref 6–20)
CO2: 21 mmol/L — ABNORMAL LOW (ref 22–32)
Calcium: 9 mg/dL (ref 8.9–10.3)
Chloride: 107 mmol/L (ref 98–111)
Creatinine: 0.8 mg/dL (ref 0.44–1.00)
GFR, Est AFR Am: >60 mL/min (ref >60)
GFR, Estimated: >60 mL/min (ref >60)
Glucose, Bld: 91 mg/dL (ref 70–99)
Potassium: 3.4 mmol/L — ABNORMAL LOW (ref 3.5–5.1)
Sodium: 141 mmol/L (ref 135–145)
Total Bilirubin: 1.9 mg/dL — ABNORMAL HIGH (ref 0.3–1.2)
Total Protein: 6.3 g/dL — ABNORMAL LOW (ref 6.5–8.1)

## 2018-11-30 LAB — MAGNESIUM: Magnesium: 1.7 mg/dL (ref 1.7–2.4)

## 2018-11-30 MED ORDER — ACETAMINOPHEN 325 MG PO TABS
650.0000 mg | ORAL_TABLET | Freq: Once | ORAL | Status: AC
  Administered 2018-11-30: 650 mg via ORAL

## 2018-11-30 MED ORDER — OXYCODONE HCL 5 MG PO TABS: 5.0000 mg | ORAL_TABLET | Freq: Three times a day (TID) | ORAL | 0 refills | Status: DC | PRN

## 2018-11-30 MED ORDER — HEPARIN SOD (PORK) LOCK FLUSH 100 UNIT/ML IV SOLN
500.0000 [IU] | Freq: Once | INTRAVENOUS | Status: DC | PRN
  Filled 2018-11-30: qty 5

## 2018-11-30 MED ORDER — HEPARIN SOD (PORK) LOCK FLUSH 100 UNIT/ML IV SOLN
500.0000 [IU] | Freq: Once | INTRAVENOUS | Status: AC | PRN
  Administered 2018-11-30: 500 [IU]
  Filled 2018-11-30: qty 5

## 2018-11-30 MED ORDER — ZOLEDRONIC ACID 4 MG/5ML IV CONC: 4.0000 mg | Freq: Once | INTRAVENOUS | Status: DC

## 2018-11-30 MED ORDER — ZOLEDRONIC ACID 4 MG/100ML IV SOLN
INTRAVENOUS | Status: AC
  Filled 2018-11-30: qty 100

## 2018-11-30 MED ORDER — SODIUM CHLORIDE 0.9 % IV SOLN
Freq: Once | INTRAVENOUS | Status: AC
  Administered 2018-11-30: 09:00:00 via INTRAVENOUS
  Filled 2018-11-30: qty 250

## 2018-11-30 MED ORDER — TRASTUZUMAB-DKST CHEMO 150 MG IV SOLR
6.0000 mg/kg | Freq: Once | INTRAVENOUS | Status: AC
  Administered 2018-11-30: 357 mg via INTRAVENOUS
  Filled 2018-11-30: qty 17

## 2018-11-30 MED ORDER — SODIUM CHLORIDE 0.9% FLUSH
10.0000 mL | Freq: Once | INTRAVENOUS | Status: AC
  Administered 2018-11-30: 10 mL
  Filled 2018-11-30: qty 10

## 2018-11-30 MED ORDER — ALTEPLASE 2 MG IJ SOLR
2.0000 mg | Freq: Once | INTRAMUSCULAR | Status: DC | PRN
  Filled 2018-11-30: qty 2

## 2018-11-30 MED ORDER — ACETAMINOPHEN 325 MG PO TABS
ORAL_TABLET | ORAL | Status: AC
  Filled 2018-11-30: qty 2

## 2018-11-30 MED ORDER — LORATADINE 10 MG PO TABS
10.0000 mg | ORAL_TABLET | Freq: Once | ORAL | Status: AC
  Administered 2018-11-30: 10 mg via ORAL

## 2018-11-30 MED ORDER — SODIUM CHLORIDE 0.9% FLUSH
10.0000 mL | INTRAVENOUS | Status: DC | PRN
  Administered 2018-11-30: 10 mL
  Filled 2018-11-30: qty 10

## 2018-11-30 MED ORDER — ZOLEDRONIC ACID 4 MG/100ML IV SOLN
4.0000 mg | Freq: Once | INTRAVENOUS | Status: AC
  Administered 2018-11-30: 4 mg via INTRAVENOUS

## 2018-11-30 MED ORDER — SODIUM CHLORIDE 0.9% FLUSH
10.0000 mL | INTRAVENOUS | Status: DC | PRN
  Filled 2018-11-30: qty 10

## 2018-11-30 MED ORDER — LORATADINE 10 MG PO TABS
ORAL_TABLET | ORAL | Status: AC
  Filled 2018-11-30: qty 1

## 2018-11-30 MED ORDER — SODIUM CHLORIDE 0.9 % IJ SOLN: 10.0000 mL | INTRAMUSCULAR | Status: DC | PRN

## 2018-11-30 NOTE — Telephone Encounter (Signed)
Oral Chemotherapy Pharmacist Encounter   I spoke with patient with the aid of an interpreter in the infusion room for overview of new medication regimen consisting of Tukysa (tucatinib) and Xeloda (capecitabine) for the treatment of metastatic, HER-2 receptor positive breast cancer, in conjunction with infusional trastuzumab, planned duration until disease progression or unacceptable toxicity.  Patient is going to start on Tykysa now, is continuing on trastuzumab, and then will add Xeloda in the future based on toleration.  Counseled patient on administration, dosing, side effects, monitoring, drug-food interactions, safe handling, storage, and disposal.  Patient will start Tukysa on a very slow up-titration to the target dose of 300 mg BID. Patient will start Laury Axon with the use of 50 mg tablets with eventual change to the 150 mg tablets once she is at a stable dose.  Week 1, days 1-3 (11/30, 12/1, & 12/2): Patient will take Tukysa 60m tablets, 1 tablets (594m by mouth twice daily, without regard to food, taken continuously.  Week 1, days 4-7 (12/3-12/6): Patient will take Tukysa 5061mablets, 2 tablets (100m61my mouth twice daily, without regard to food, taken continuously.  Week 2 (12/7): Patient will take Tukysa 50mg45mlets, 3 tablets (150mg)59mmouth twice daily, without regard to food, taken continuously.  Week 3 (12/14): Patient will take Tukysa 50mg t64mts, 4 tablets (200mg) b65muth twice daily, without regard to food, taken continuously.  Week 4 (12/21): Patient will take Tukysa 50mg tab32m, 5 tablets (250mg) by 18mh twice daily, without regard to food, taken continuously.  Week 5 (12/28): Patient will take Tukysa 50mg table17m6 tablets (300mg) by mo39mtwice daily, without regard to food, taken continuously.  Trastuzumab will be administered at standard dosing IV every 3 weeks.   Tukysa startLaury Axon 12/12/2018  Adverse effects of Xeloda include but are not limited  to: fatigue, decreased blood counts, GI upset, diarrhea, mouth sores, and hand-foot syndrome.  Adverse effects of Tukysa include but are not limited to: palmar-plantar erythrodysesthesia, increased liver enzymes, hepatotoxicity, nausea, vomiting, mouth sores, decreased appetite, diarrhea, and electrolyte abnormalities.  Patient has anti-emetic on hand and knows to take it if nausea develops.   Patient will obtain anti diarrheal and alert the office of 4 or more loose stools above baseline.   Reviewed with patient importance of keeping a medication schedule and plan for any missed doses.  Patient provided written medication information and a medication calendar to help with up.-titration schedule  Medication reconciliation performed and medication/allergy list updated.  Insurance authorization for Tukysa was nLaury Axonuired at this time. Patient with active Medicaid prescription coverage Copayment $3 for 1st fill of Tukysa.  Prescriptions will ship from the [facility] to deliver to patient's home on 11/19.  Patient informed the pharmacy will reach out 5-7 days prior to needing next fills of Xeloda and Tukysa to coordinate continued medication acquisition to prevent break in therapy.  All questions answered.  Mrs. Levert voWeddingtonrstanding and appreciation.   Patient knows to call the office with questions or concerns.  Jesse Milliani Herrada, Johny DrillingPS, BCOP  11/30/2018  10:40 AM Oral Oncology Clinic [phone]

## 2018-11-30 NOTE — Telephone Encounter (Signed)
Appts were scheduled per sch message.  The los was to be disregarded.  Took the patient a print out of her updated calendar while she was in treatment.

## 2018-11-30 NOTE — Patient Instructions (Signed)
[facility] °Discharge Instructions for Patients Receiving Chemotherapy ° °Today you received the following chemotherapy agents Trastuzumab ° °To help prevent nausea and vomiting after your treatment, we encourage you to take your nausea medication as directed. °  °If you develop nausea and vomiting that is not controlled by your nausea medication, call the clinic.  ° °BELOW ARE SYMPTOMS THAT SHOULD BE REPORTED IMMEDIATELY: °· *FEVER GREATER THAN 100.5 F °· *CHILLS WITH OR WITHOUT FEVER °· NAUSEA AND VOMITING THAT IS NOT CONTROLLED WITH YOUR NAUSEA MEDICATION °· *UNUSUAL SHORTNESS OF BREATH °· *UNUSUAL BRUISING OR BLEEDING °· TENDERNESS IN MOUTH AND THROAT WITH OR WITHOUT PRESENCE OF ULCERS °· *URINARY PROBLEMS °· *BOWEL PROBLEMS °· UNUSUAL RASH °Items with * indicate a potential emergency and should be followed up as soon as possible. ° °Feel free to call the clinic should you have any questions or concerns. The clinic phone number is [phone]. ° °Please show the CHEMO ALERT CARD at check-in to the Emergency Department and triage nurse. ° ° °

## 2018-11-30 NOTE — Progress Notes (Signed)
Spoke w/ Dr. Burr Medico, patient is to get zometa today.   Demetrius Charity, PharmD, [place] Pharmacist Pharmacy Phone: [phone] 11/30/2018

## 2018-11-30 NOTE — Addendum Note (Signed)
Addended by: Delane Ginger on: 11/30/2018 08:32 AM   Modules accepted: Orders

## 2018-11-30 NOTE — Progress Notes (Addendum)
[name]   [phone] [phone]   Clinic Follow up Note   Patient Care Team: [name], [name], [name] as PCP - General (Physician Assistant) [name], MD as Consulting Physician (Orthopedic Surgery)   CHIEF COMPLAINT: F/u ofmetastatic breast cancer  SUMMARY OF ONCOLOGIC HISTORY: Oncology History Overview Note   Cancer Staging No matching staging information was found for the patient.     [name], right ([place])  11/1997 Cancer Diagnosis   Patient had 1.4 cm poorly differentiated right breast carcinoma diagnosed in Nov 1999 at age 53, 1/7 axillary nodes involved, ER/PR and HER 2 positive. She had mastectomy with the 7 axillary node evaluation, 4 cycles of adriamycin/ cytoxan followed by taxotere, then five years of tamoxifen thru June 2005 (no herceptin in 1999). She had bilateral oophorectomy in May 2005. She was briefly on aromatase inhibitor after tamoxifen, but discontinued this herself due to poor tolerance.   04/04/2015 Genetic Testing   Negative genetic testing on the breast/ovarian cancer pnael.  The Breast/Ovarian gene panel offered by [facility] includes sequencing and rearrangement analysis for the following 20 genes:  ATM, BARD1, BRCA1, BRCA2, BRIP1, CDH1, CHEK2, EPCAM, FANCC, MLH1, MSH2, MSH6, NBN, PALB2, PMS2, PTEN, RAD51C, RAD51D, TP53, and XRCC2.   06/26/2015 Pathology Results   Initial Path Report Bone, curettage, Left femur met, intramedullary subtroch tissue - METASTATIC ADENOCARCINOMA.  Estrogen Receptor: 95%, POSITIVE, STRONG STAINING INTENSITY Progesterone Receptor: 2%, POSITIVE, STRONG  HER2 (+), IHC 3+   06/26/2015 Surgery   Biopsy of metastatic tissue and stabilization with Affixus trochanteric nail, proximal and distal interlock of left femur by Dr. [name]    06/28/2015 Imaging   CT Chest w/ Contrast 1. Extensive right internal mammary chain lymphadenopathy consistent with  metastatic breast cancer. 2. Lytic metastases involving the posterior elements at T1 with probable nondisplaced pathologic fracture of the spinous process. 3. No other evidence of thoracic metastatic disease. 4. Trace bilateral pleural effusions with associated bibasilar atelectasis. 5. Indeterminate right thyroid nodule, not previously imaged. This could be evaluated with thyroid ultrasound as clinically warranted.   07/01/2015 Imaging   Bone Scan 1. Increased activity noted throughout the left femur. Although metastatic disease cannot be excluded, these changes are most likely secondary to prior surgery. 2. No other focal abnormalities identified to suggest metastatic disease.   07/12/2015 - 07/26/2015 Radiation Therapy   Left femur, 30 Gy in 10 fractions by Dr. Sondra Come    07/14/2015 Initial Diagnosis   [name], right ([place])   07/19/2015 Imaging   Initial PET Scan 1. Severe multifocal osseous metastatic disease, much greater than anticipated based on prior imaging. 2. Multifocal nodal metastases involving the right internal mammary, prevascular and subpectoral lymph nodes. No axillary pulmonary involvement identified. 3. No distant extra osseous metastases.   07/30/2015 - 11/29/2015 Chemotherapy   Docetaxel, Herceptin and Perjeta every 3 weeks X6 cycles, pt tolerated moderately well, restaging scan showed excellent response    09/18/2015 Imaging   CT Head w/wo Contrast 1. No acute intracranial abnormality or significant interval change. 2. Stable minimal periventricular white matter hypoattenuation on the right. This may reflect a remote ischemic injury. 3. No evidence for metastatic disease to the brain. 4. No focal soft tissue lesion to explain the patient's tenderness.   10/30/2015 Imaging   CT Abdomen Pelvis w/ Contrast 1. Few mildly prominent fluid-filled loops of small bowel scattered throughout the abdomen, with intraluminal fluid density within the  distal colon. Given the provided history, findings are suggestive of acute enteritis/diarrheal illness. 2. No other acute intra-abdominal or pelvic process identified. 3. Widespread osseous metastatic disease, better evaluated on most recent PET-CT from 07/19/2015. No pathologic fracture or other complication. 4. 11 mm hypodensity within the left kidney. This lesion measures intermediate density, and is indeterminate. While this lesion is similar in size relative to recent studies, this is increased in size relative to prior study from 2012. Further evaluation with dedicated renal mass protocol CT and/or MRI is recommended for complete characterization.   12/2015 - 05/14/2016 Chemotherapy   Maintenance Herceptin and pejeta every 3 weeks stopped on 05/14/16 due to disease progression   12/25/2015 Imaging   Restaging PET Scan 1. Markedly improved skeletal activity, with only several faint foci of residual accentuated metabolic activity, but resolution of the vast majority of the previously extensive osseous metastatic disease. 2. Resolution of the prior right internal mammary and right prevascular lymph nodes. 3. Residual subcutaneous edema and edema along fascia planes in the left upper thigh. This remains somewhat more than I would expect for placement of an IM nail 6 weeks ago. If there is leg swelling further down, consider Doppler venous ultrasound to rule out left lower extremity DVT. I do not perceive an obvious difference in density between the pelvic and common femoral veins on the noncontrast CT data. 4. Chronic right maxillary and right sphenoid sinusitis.   01/16/2016 - 10/20/2017 Anti-estrogen oral therapy   Letrozole 2.5 mg daily. She was switched to exemestane due to disease progression on 02/08/17. Stopped on 10/20/2017 when treatment changed to chemo    03/01/2016 Miscellaneous   Patient presented to ED following a fall; she reports she landed on her back and is now experiencing back pain.  The patient was evaluated and discharge home same day with pain control.   05/01/2016 Imaging   CT CAP IMPRESSION: 1. Stable exam.  No new or progressive disease identified. 2. No evidence for residual or recurrent adenopathy within the chest. 3. Stable bone metastasis.   05/01/2016 Imaging   BONE SCAN IMPRESSION: 1. Small focal uptake involving the anterior rib ends of the left second and right fourth ribs, new since the prior bone scan. The location of this uptake is more suggestive of a traumatic or inflammatory etiology as opposed to metastatic disease. No other evidence of metastatic disease. 2. There are other areas of stable uptake which are most likely degenerative/reactive, including right shoulder and cervical spine uptake and uptake along the right femur adjacent to the ORIF hardware.   05/20/2016 Imaging   NM PET Skull to Thigh IMPRESSION: 1. There multiple metastatic foci in the bony pelvis, including some new abnormal foci compare to the prior PET-CT, compatible with mildly progressive bony metastatic disease. Also, a left T11 vertebral hypermetabolic metastatic lesion is observed, new compared to the prior exam. 2. No active extraosseous malignancy is currently identified. 3. Right anterior fourth rib healing fracture, appears be   06/04/2016 - 01/08/2017 Chemotherapy   Second line chemotherapy Kadcyla every 3 weeks started on 06/04/16 and stopped on 01/08/17 due to mixed response.     10/05/2016 Imaging   CT CAP and Bone Scan:  Bones: left intramedullary rod and left femoral head neck screw in place. In the proximal diaphysis of the left femur there is cortical thickening and irregularity. No lytic or blastic bone lesions. No fracture or vertebral endplate destruction.   No definite evidence of recurrent disease in the chest, abdomen,  or pelvis.   10/28/2016 Imaging   CT A/P IMPRESSION: No acute process demonstrated in the abdomen or pelvis. No evidence of  bowel obstruction or inflammation.   11/04/2016 Imaging   DG foot complete left: IMPRESSION: No fracture or dislocation.  No soft tissue abnormality   11/23/2016 Imaging   CT RIGHT FEMUR IMPRESSION: Negative CT scan of the right thigh.  No visible metastatic disease.  CT LEFT FEMUR IMPRESSION: 1. Postsurgical changes related to prior cephalomedullary rod fixation of the left femur with linear lucency along the anterolateral proximal femoral cortex at level of the lesser trochanter, suspicious for nondisplaced fracture. 2. Slightly permeative appearance of the proximal left femoral cortex at the site of known prior osseous metastases is largely unchanged. 3. Unchanged patchy lucency and sclerosis along the anterior acetabulum, which appears to correspond with an area of faint uptake on the prior PET-CT, suspicious for osseous metastasis. These results will be called to the ordering clinician or representative by the Radiologist Assistant, and communication documented in the PACS or zVision Dashboard.     01/27/2017 Imaging   IMPRESSION: 1. Mixed response to osseous metastasis. Some hypermetabolic lesions are new and increasingly hypermetabolic, while another index lesion is decreasingly hypermetabolic. 2. No evidence of hypermetabolic soft tissue metastasis.    02/08/2017 - 10/19/2017 Antibody Plan   -Herceptin every 3 week since 02/08/17 -Oral Lanpantinib 1528m and decreased to 5042mdue to diarrhea and depression. Due to disease progression she swithced to Verzenio 15073mn 05/04/17. She did not toelrate well so she was switched to Ibrance 100m88m 05/31/17. Due to her neutropenia, Ibrance dose reduced to 75 mg daily, 3 weeks on, one-week off, stopped on 10/20/2017 due to disease progression.    04/30/2017 PET scan   IMPRESSION: 1. Primarily increase in hypermetabolism of osseous metastasis. An isolated right acetabular lesion is decreased in hypermetabolism since the prior  exam. 2. No evidence of hypermetabolic soft tissue metastasis. 3. Similar non FDG avid right-sided thyroid nodule, favoring a benign etiology. Recommend attention on follow-up.   08/30/2017 PET scan   08/30/2017 PET Scan  IMPRESSION: 1. Worsening osseous metastatic disease. 2. Focal hypermetabolism in the central spinal canal the L1 level, similar to the prior exam. Metastatic implant cannot be excluded. 3. Slight enlargement of a right thyroid nodule which now appears more cystic in character. Consider further evaluation with thyroid ultrasound. If patient is clinically hyperthyroid, consider nuclear medicine thyroid uptake and scan   08/30/2017 Progression   08/30/2017 PET Scan  IMPRESSION: 1. Worsening osseous metastatic disease. 2. Focal hypermetabolism in the central spinal canal the L1 level, similar to the prior exam. Metastatic implant cannot be excluded. 3. Slight enlargement of a right thyroid nodule which now appears more cystic in character. Consider further evaluation with thyroid ultrasound. If patient is clinically hyperthyroid, consider nuclear medicine thyroid uptake and scan   09/29/2017 - 10/11/2017 Radiation Therapy   Pt received 27 Gy in 9 fractions directed at the areas of significant uptake noted on recent bone scan the right pelvis and right proximal femur, with Dr, KinaSondra Come10/09/2017 - 04/07/2018 Chemotherapy   -Herceptin every 3 weeks starting 02/08/17, perjeta added on 10/20/2017  -weekly Taxol started on 10/21/2017. Will reduce Taxol to 2 weeks on, 1 week off starting on 02/04/17.  Stoped due to mild disease progression   10/27/2017 Imaging   10/27/2017 CT AP IMPRESSION: 1. No acute abnormality. No evidence of bowel obstruction or acute bowel inflammation. Normal appendix. 2. Stable  patchy sclerotic osseous metastases throughout the visualized skeleton. No new or progressive metastatic disease in the abdomen or pelvis.   01/11/2018 Imaging   Whole Body  Bone Scan 01/11/18  IMPRESSION: 1. Multifocal osseous metastases as described. 2. Right scapular metastasis. 3. Right superior thoracic spinous process. 4. Right sacral ala and right L5 metastases. 5. Right acetabular metastasis. 6. Right proximal femur metastasis. 7. Diffuse metastases throughout the left femur. 8. Anterior right fourth rib metastasis.   01/11/2018 Imaging   CT CAP W Contrast 01/11/18  IMPRESSION: 1. Similar-appearing osseous metastatic disease. 2. No evidence for progressed metastatic disease in the chest, abdomen or pelvis.   03/30/2018 Imaging   Bone Scan 03/30/18  IMPRESSION: Stable multifocal bony metastatic disease.   03/30/2018 Imaging    CT CAP W Contrast 03/30/18 IMPRESSION: 1. New mild contiguous wall thickening with associated mucosal hyperenhancement throughout the left colon and rectum, suggesting nonspecific infectious or inflammatory proctocolitis. Differential includes C diff colitis. 2. Stable faint patchy sclerosis in the iliac bones and lumbar vertebral bodies. No new or progressive osseous metastatic disease by CT. Please note that the osseous lesions are relatively occult by CT and would be better evaluated by PET-CT, as clinically warranted. 3. No evidence of extra osseous metastatic disease.  These results will be called to the ordering clinician or representative by the Radiologist Assistant, and communication documented in the PACS or zVision Dashboard.   04/15/2018 - 06/16/2018 Chemotherapy   Enhertu q3weeks starting 04/15/18. Stopped Enhertu after cycle 3 on 05/27/18 due to poor toleration due to poor toleration with anorexia, weight loss, N&V, diarrhea   04/21/2018 PET scan   PET 04/21/18  IMPRESSION: 1. Overall marked interval improvement in bony hypermetabolic disease. All of the previously identified hypermetabolic osseous metastases have decreased in hypermetabolism in the interval. There is a new tiny focus of hypermetabolism in  the right aspect of the L3 vertebral body that appears to correspond to a new 8 mm lucency, raising concern for new metastatic disease. Close attention on follow-up recommended. 2. Focal hypermetabolism in the central spinal canal at the level of L1 previously has resolved. 3. No hypermetabolic soft tissue disease on today's study. 4. Right thyroid nodule seen previously has decreased in the interval.    06/16/2018 Imaging   CT LUMBAR SPINE WO CONTRAST 06/16/18 IMPRESSION: 1. Transitional lumbosacral anatomy. Correlation with radiographs is recommended prior to any operative intervention. 2. Suspicion of a right L3 vertebral body metastasis on the April PET-CT is not evident by CT. Stable degenerative versus metastatic lesion at the right L5 superior endplate. Small but increased right iliac bone metastasis since March. Lumbar MRI would best characterize lumbar metastatic disease (without contrast if necessary). 3. Suspect L2-L3 left subarticular segment disc herniation. Query left L2 radiculitis. 4. Stable appearance of rightward L4-L5 disc degeneration which could affect the right foramen and right lateral recess.   06/17/2018 -  Chemotherapy   Restart Taxol 2 weeks on/1 week off with Herceptin and Perjeta q3weeks on 06/17/18. Stopped Taxol after 11/16/18 due to disease progression to L3.    07/06/2018 PET scan   PET  IMPRESSION: 1. Near total resolution of hypermetabolic activity associated with skeletal metastasis. Very mild activity associated with the LEFT iliac bone which is similar to background blood pool activity. 2. No new or progressive disease.   11/21/2018 PET scan   IMPRESSION: 1. New focal hypermetabolic lesion eccentric to the right in the L3 vertebral body, maximum SUV 6.1 compatible with a new focus  of active malignancy. 2. Very low-grade activity similar to blood pool in previous existing mildly sclerotic metastatic lesions including the left iliac bone lesion.  Some of the mildly sclerotic lesions are not hypermetabolic compatible with effectively treated bony metastatic disease. 3. No hypermetabolic soft tissue lesions are identified.      Chemotherapy   PENDING Tucatinib (TUKYSA) 6 tabs BID as a next line therapy starting 11/30/18 -Continue Herceptin q3weeks       CURRENT THERAPY:  -PENDING Tucatinib (TUKYSA) 6 tabs BID as a next line therapy starting 12/05/2018 -Continue Herceptin q3weeks   INTERVAL HISTORY:  [name] is here for a follow up, she is accompanied by her interpreter.  She noticed newly developed low back pain, which is mild, does not limit her activities.  She still has chronic left hip discomfort from previous surgery, no visible pain, dyspnea new symptoms.  Her appetite and energy level are decent, no nausea.  She has stable mild neuropathy, mainly in her feet.  She has been taking oxycodone 2 to 3 tablets a day.  Review of system otherwise negative  MEDICAL HISTORY:  Past Medical History:  Diagnosis Date  . Abnormal Pap smear   . Anxiety   . Breast cancer ([place])   . Cancer ([place]) 1999   breast-s/p mastectomy, chemo, rad  . CIN I (cervical intraepithelial neoplasia I) 2003   by colpo  . Congenital deafness   . Deaf   . Depression   . Port-A-Cath in place 2017   for chemotherapy  . Radiation 07/12/15-07/26/15   left femur 30 Gy  . S/P bilateral oophorectomy     SURGICAL HISTORY: Past Surgical History:  Procedure Laterality Date  . ABDOMINAL HYSTERECTOMY    . INTRAMEDULLARY (IM) NAIL INTERTROCHANTERIC Left 06/26/2015   Procedure: LEFT BIOMET LONG AFFIXS NAIL;  Surgeon: [name], MD;  Location: [facility];  Service: Orthopedics;  Laterality: Left;  . IR GENERIC HISTORICAL  08/06/2015   IR US GUIDE VASC ACCESS LEFT 08/06/2015 Aletta Edouard, MD [facility]  . IR GENERIC HISTORICAL  08/06/2015   IR FLUORO GUIDE CV LINE LEFT 08/06/2015 Aletta Edouard, MD [facility]  . IR GENERIC HISTORICAL  03/05/2016   IR  CV LINE INJECTION 03/05/2016 Markus Daft, MD [facility]  . LEFT HEART CATH AND CORONARY ANGIOGRAPHY N/A 12/13/2017   Procedure: LEFT HEART CATH AND CORONARY ANGIOGRAPHY;  Surgeon: Leonie Man, MD;  Location: [facility];  Service: Cardiovascular;  Laterality: N/A;  . MASTECTOMY     right breast  . MASTECTOMY    . OVARIAN CYST REMOVAL    . RADIOLOGY WITH ANESTHESIA Left 06/25/2015   Procedure: MRI OF LEFT HIP WITH OR WITHOUT CONTRAST;  Surgeon: Medication Radiologist, MD;  Location: [facility];  Service: Radiology;  Laterality: Left;  DR. MCINTYRE/MRI  . TUBAL LIGATION      I have reviewed the social history and family history with the patient and they are unchanged from previous note.  ALLERGIES:  is allergic to promethazine hcl; chlorhexidine; and diphenhydramine hcl.  MEDICATIONS:  Current Outpatient Medications  Medication Sig Dispense Refill  . albuterol (PROAIR HFA) 108 (90 Base) MCG/ACT inhaler Inhale 2 puffs into the lungs every 6 (six) hours as needed for wheezing or shortness of breath.    Marland Kitchen albuterol (PROVENTIL HFA;VENTOLIN HFA) 108 (90 Base) MCG/ACT inhaler Inhale 2 puffs into the lungs every 6 (six) hours as needed for wheezing or shortness of breath. 1 Inhaler 0  . ALPRAZolam (  XANAX) 0.25 MG tablet Take 1 tablet (0.25 mg total) by mouth 2 (two) times daily as needed for anxiety. 30 tablet 0  . diphenoxylate-atropine (LOMOTIL) 2.5-0.025 MG tablet Take 1-2 tablets by mouth 4 (four) times daily as needed for diarrhea or loose stools. 30 tablet 1  . ergocalciferol (VITAMIN D2) 1.25 MG (50000 UT) capsule Take 1 capsule (50,000 Units total) by mouth once a week. 8 capsule 0  . lidocaine-prilocaine (EMLA) cream Apply 1 application topically as needed. Apply to Porta-Cath 1-2 hours prior to access as directed. (Patient taking differently: Apply 1 application topically as needed (To Porta-Cath 1-2 hours prior to access as directed). ) 90 g 2  . magnesium oxide (MAG-OX) 400 (241.3  Mg) MG tablet Take 1 tablet (400 mg total) by mouth daily. 30 tablet 3  . methocarbamol (ROBAXIN) 500 MG tablet Take 1 tablet (500 mg total) by mouth 2 (two) times daily. (Patient taking differently: Take 500 mg by mouth 2 (two) times daily as needed for muscle spasms. ) 20 tablet 0  . omeprazole (PRILOSEC) 20 MG capsule Take 1 capsule (20 mg total) by mouth daily. 30 capsule 0  . ondansetron (ZOFRAN) 8 MG tablet Take 1 tablet (8 mg total) by mouth every 8 (eight) hours as needed for nausea or vomiting. 45 tablet 1  . oxyCODONE (ROXICODONE) 5 MG immediate release tablet Take 1 tablet (5 mg total) by mouth every 8 (eight) hours as needed for severe pain. 60 tablet 0  . potassium chloride (K-DUR) 10 MEQ tablet Take 2 tablets (20 mEq total) by mouth 2 (two) times daily. (Patient taking differently: Take 30 mEq by mouth daily. ) 180 tablet 3  . prochlorperazine (COMPAZINE) 10 MG tablet Take 1 tablet (10 mg total) by mouth every 6 (six) hours as needed for nausea or vomiting. 60 tablet 2  . tucatinib (TUKYSA) 50 MG tablet Week1: Take 3 tabs (151m) BID, week2: take 4 tab (2036m BID, Week3: take 5 tab (25035mBID, week4 & onward: take 6 tab (300m60mID. With or w out food 300 tablet 0  . zolpidem (AMBIEN) 5 MG tablet Take 1-2 tablets (5-10 mg total) by mouth at bedtime as needed. 60 tablet 0   No current facility-administered medications for this visit.    Facility-Administered Medications Ordered in Other Visits  Medication Dose Route Frequency Provider Last Rate Last Dose  . potassium chloride SA (K-DUR) CR tablet 40 mEq  40 mEq Oral Once FengTruitt Merle      . sodium chloride 0.9 % 1,000 mL with potassium chloride 10 mEq infusion   Intravenous Continuous LiveGordy Levan   Stopped at 09/10/15 1653  . sodium chloride 0.9 % injection 10 mL  10 mL Intravenous PRN Livesay, Lennis P, MD        PHYSICAL EXAMINATION: ECOG PERFORMANCE STATUS: 2 - Symptomatic, <50% confined to bed  GENERAL:alert, no  distress and comfortable SKIN: no rash EYES:  sclera clear LYMPH:  no palpable cervical or supraclavicular lymphadenopathy LUNGS: clear to auscultation with normal breathing effort HEART: regular rate & rhythm, no lower extremity edema ABDOMEN:abdomen soft, non-tender and normal bowel sounds.  No hepatomegaly Musculoskeletal:no cyanosis of digits, no spinal tenderness   NEURO: alert & oriented x 3 with fluent speech, normal gait  LABORATORY DATA:  I have reviewed the data as listed CBC Latest Ref Rng & Units 11/30/2018 11/16/2018 11/09/2018  WBC 4.0 - 10.5 K/uL 3.8(L) 3.6(L) 3.1(L)  Hemoglobin 12.0 - 15.0 g/dL 10.5(L) 10.6(L)  10.8(L)  Hematocrit 36.0 - 46.0 % 31.0(L) 31.1(L) 31.3(L)  Platelets 150 - 400 K/uL 182 203 189     CMP Latest Ref Rng & Units 11/16/2018 11/09/2018 10/28/2018  Glucose 70 - 99 mg/dL 77 98 86  BUN 6 - 20 mg/dL _0 Creatinine 0.44 - 1.00 mg/dL 0.83 0.95 0.81  Sodium 135 - 145 mmol/L 140 146(H) 143  Potassium 3.5 - 5.1 mmol/L 3.5 3.2(L) 3.2(L)  Chloride 98 - 111 mmol/L 107 107 107  CO2 22 - 32 mmol/L _1 Calcium 8.9 - 10.3 mg/dL 9.1 8.8(L) 8.5(L)  Total Protein 6.5 - 8.1 g/dL 6.3(L) 6.1(L) 6.0(L)  Total Bilirubin 0.3 - 1.2 mg/dL 1.7(H) 1.3(H) 1.6(H)  Alkaline Phos 38 - 126 U/L 52 55 51  AST 15 - 41 U/L 13(L) 13(L) 15  ALT 0 - 44 U/L _2 RADIOGRAPHIC STUDIES: I have personally reviewed the radiological images as listed and agreed with the findings in the report. No results found.   ASSESSMENT & PLAN:  [name] is a 53 y.o. 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 She had right mastectomy, 4 cycles of AC-T and 5 years of Tamoxifen/AI.  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 She has received Target RT.  She had excellent response to First-line Docetaxel/Herceptin/Perjeta and AI  but progressed on maintenance therapy. She had mixed response on second line Kadcyla, progressed on third-line Ibrance/Herceptin and poorly tolerated Enhertu.  -She is currently on restarted Taxol/Herceptin/Perjeta q3weeks since 06/17/18. Perjeta was stopped after C5 due to diarrhea.  -I personally reviewed and discussed her PET from 11/21/18 with pt again today which shows new focal hypermetabolic lesion in L3 vertebral body. Otherwise stable disease in bones without significant hypermetabolic activity. Overall she has mild disease progression.  -I will stop Taxol due to her recent disease progression  -I recommend next line therapy withTucatinib (TUKYSA) 52m 6 tabs BID as a next line therapy. The benefit and potential side effects were discussed with her,  especially diarrhea, nausea, fatigue, increasing joint, decreased appetite, skin rash, anemia, hepatotoxicity, etc.  She is interested. She would continue Herceptin q3weeks. She is agreeable to try.  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 -we discussed that tucatinib is usually given with Xeloda, due to her poor tolerance to Xeloda in the past, oxycodone for first month, may consider low dose Xeloda from second month.  -We again discussed the goal of therapy is palliative, to prolong her life. -she will have chemo teaching with Jess today -plan to start next Monday, I will see her back a week after  -continue Herceptin every 3 weeks   2.Body pain, secondary to bone mets, Hypocalcemia, leg cramps  -Since her cancer diagnosis,she has had b/l hip, shoulder and chest painat different time points, likely related to her bone mets -She declined Gabapentin given risk of suicidal thoughts. -She is being treated with Zometa [id number] does have hypocalcemia with leg cramps secondary to Zometa.  -She will continue  oral calcium and Vit D.  -She now developed mild low back pain, responding to the L3 new bone lesion, will continue pain medication, she is starting  new therapy -We discussed the role of palliative radiation, if her back pain gets worse.  3.Congenital deafness  4.Mild anemia, secondary to chemotherapy and malignancy -She will start with oral iron daily for 1-2 months and then stop. She will also start daily women's vitamin.  5.Hypokalemia, Diarrhea -She developed diarrhea s/p cycle 3 Perjeta. Has been d/c  -I started her on Mag 449m daily and Oral potassium 171m 2-3 times a day. Given she does not take potassium TID consistently I encouraged her to increase potassium in her diet. She is agreeable.    6.Goal of care discussion -she understand her goal of care is palliative to prolong her life -she is full code for now  7.Insomnia -She cannot take Benadryl due to allergy reaction. She has tried trazodone with no relief.  -She is on Xanax and Ambien 1-2 tabs nightly. 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 She declined using ice bags using infusion -stable    PLAN: -PET scan reviewed in person today, disease progression with new lesion in L3 -Lab reviewed, adequate she will proceed with mastectomy today and continue every 3 weeks -I have stopped her Taxol -[facility], and start on Monday 11/23, she will take 1 tab 5071mid on day 1, 2 tablets bid on day 2, 3 tablets bid on days 3 and forward until see me on 11/30, will gradually increase her dose after next visit  -zometa infusion today and continue every 3 weeks    No problem-specific Assessment & Plan notes found for this encounter.   No orders of the defined types were placed in this encounter.  I discussed the assessment and treatment plan with the patient. The patient was provided an opportunity to ask questions and all were answered. The patient agreed with the  plan and demonstrated an understanding of the instructions.       YanTruitt MerleD 11/30/2018   I, AmoJoslyn Devonm acting as scribe for YanTruitt MerleD.   I have reviewed the above documentation for accuracy and completeness, and I agree with the above.   Addendum  Pt spoke with our pharmacist Jess, and want to wait until after Thanksgiving to start, so will start 11/30.  She is going to do 1 tab BID on days 1-3, then 2 tabs BID on days 4-7, she will go up to 3 tabs BID for week 2, 4 tabs BID for week 3, 5 tabs BID for week 4, and then 6 tabs BID for week 5 and onward.  I will see her back on 12/7.  YanTruitt Merle

## 2018-12-02 NOTE — Telephone Encounter (Signed)
Oral Oncology Pharmacist Encounter  Confirmed with the [facility] that Laury Axon was delivered on 12/01/18 for copayment $3.  Johny Drilling, PharmD, BCPS, BCOP  12/02/2018   11:45 AM Oral Oncology Clinic [phone]

## 2018-12-09 ENCOUNTER — Ambulatory Visit: Payer: Medicaid Other

## 2018-12-09 ENCOUNTER — Other Ambulatory Visit: Payer: Medicaid Other

## 2018-12-14 ENCOUNTER — Telehealth: Payer: Self-pay

## 2018-12-14 NOTE — Telephone Encounter (Signed)
Pt called earlier today, and reported severe side effects from Tucatinib. I called her back, she started with 1 tablet (50mg ) twice daily on Monday 12/12/2018, and has been on same dose since then. She developed intermittent nausea, gastric discomfort, and severe headaches, no diarrhea.  No vomiting or fever.  She has been taking Aleve for her headaches.  I encouraged her to use Compazine or Zofran for her nausea.  I will let her come off Tucatinib for the next 2 days until her headaches resolved.  I encouraged her to try again with 1 tablet twice daily up to see me next week.  She agrees with the plan.  She knows to call me if she has more new symptoms, or dehydrated.  She does not feel she needs to come in for IV fluids.  Truitt Merle  12/14/2018

## 2018-12-14 NOTE — Telephone Encounter (Signed)
Received voice message from patient stating that she started a new chemo medication and has had a headache and severe nausea with some vomiting for 3 days.    I called her back she states she started taking Tukysa on Monday 11/30 and almost immediately started having these severe side effects.  She wants to stop the medication.  I also offered for her to come in tomorrow 12/3 to be seen and get IVF but she declined stating that she is able to drink but not able to eat.   I told her I would speak to Dr. Burr Medico and call her back.  She verbalized an understanding.

## 2018-12-19 ENCOUNTER — Ambulatory Visit: Payer: Medicaid Other | Admitting: Hematology

## 2018-12-19 ENCOUNTER — Telehealth: Payer: Self-pay

## 2018-12-19 ENCOUNTER — Other Ambulatory Visit: Payer: Medicaid Other

## 2018-12-19 NOTE — Telephone Encounter (Signed)
Received call from patient that she stopped taking the [facility] because of the terrible side effects, made Dr. Burr Medico aware.  Called her back to let her know will discuss further at her appointment on Wednesday 12/21/2018. She verbalized an understanding.

## 2018-12-19 NOTE — Progress Notes (Signed)
[place]   [phone] [phone]   Clinic Follow up Note   Patient Care Team: [name], [name], [name] as PCP - General (Physician Assistant) [name], MD as Consulting Physician (Orthopedic Surgery)  Date of Service:  12/21/2018  CHIEF COMPLAINT:  F/u ofmetastatic breast cancer  SUMMARY OF ONCOLOGIC HISTORY: Oncology History Overview Note   Cancer Staging No matching staging information was found for the patient.     [name], right ([place])  11/1997 Cancer Diagnosis   Patient had 1.4 cm poorly differentiated right breast carcinoma diagnosed in Nov 1999 at age 55, 1/7 axillary nodes involved, ER/PR and HER 2 positive. She had mastectomy with the 7 axillary node evaluation, 4 cycles of adriamycin/ cytoxan followed by taxotere, then five years of tamoxifen thru June 2005 (no herceptin in 1999). She had bilateral oophorectomy in May 2005. She was briefly on aromatase inhibitor after tamoxifen, but discontinued this herself due to poor tolerance.   04/04/2015 Genetic Testing   Negative genetic testing on the breast/ovarian cancer pnael.  The Breast/Ovarian gene panel offered by [facility] includes sequencing and rearrangement analysis for the following 20 genes:  ATM, BARD1, BRCA1, BRCA2, BRIP1, CDH1, CHEK2, EPCAM, FANCC, MLH1, MSH2, MSH6, NBN, PALB2, PMS2, PTEN, RAD51C, RAD51D, TP53, and XRCC2.   06/26/2015 Pathology Results   Initial Path Report Bone, curettage, Left femur met, intramedullary subtroch tissue - METASTATIC ADENOCARCINOMA.  Estrogen Receptor: 95%, POSITIVE, STRONG STAINING INTENSITY Progesterone Receptor: 2%, POSITIVE, STRONG  HER2 (+), IHC 3+   06/26/2015 Surgery   Biopsy of metastatic tissue and stabilization with Affixus trochanteric nail, proximal and distal interlock of left femur by Dr. [name]    06/28/2015 Imaging   CT Chest w/ Contrast 1. Extensive right internal mammary chain  lymphadenopathy consistent with metastatic breast cancer. 2. Lytic metastases involving the posterior elements at T1 with probable nondisplaced pathologic fracture of the spinous process. 3. No other evidence of thoracic metastatic disease. 4. Trace bilateral pleural effusions with associated bibasilar atelectasis. 5. Indeterminate right thyroid nodule, not previously imaged. This could be evaluated with thyroid ultrasound as clinically warranted.   07/01/2015 Imaging   Bone Scan 1. Increased activity noted throughout the left femur. Although metastatic disease cannot be excluded, these changes are most likely secondary to prior surgery. 2. No other focal abnormalities identified to suggest metastatic disease.   07/12/2015 - 07/26/2015 Radiation Therapy   Left femur, 30 Gy in 10 fractions by Dr. Sondra Come    07/14/2015 Initial Diagnosis   [name], right ([place])   07/19/2015 Imaging   Initial PET Scan 1. Severe multifocal osseous metastatic disease, much greater than anticipated based on prior imaging. 2. Multifocal nodal metastases involving the right internal mammary, prevascular and subpectoral lymph nodes. No axillary pulmonary involvement identified. 3. No distant extra osseous metastases.   07/30/2015 - 11/29/2015 Chemotherapy   Docetaxel, Herceptin and Perjeta every 3 weeks X6 cycles, pt tolerated moderately well, restaging scan showed excellent response    09/18/2015 Imaging   CT Head w/wo Contrast 1. No acute intracranial abnormality or significant interval change. 2. Stable minimal periventricular white matter hypoattenuation on the right. This may reflect a remote ischemic injury. 3. No evidence for metastatic disease to the brain. 4. No focal soft tissue lesion to explain the patient's tenderness.   10/30/2015 Imaging   CT Abdomen Pelvis w/ Contrast 1. Few mildly prominent fluid-filled loops of small bowel scattered throughout the abdomen,  with  intraluminal fluid density within the distal colon. Given the provided history, findings are suggestive of acute enteritis/diarrheal illness. 2. No other acute intra-abdominal or pelvic process identified. 3. Widespread osseous metastatic disease, better evaluated on most recent PET-CT from 07/19/2015. No pathologic fracture or other complication. 4. 11 mm hypodensity within the left kidney. This lesion measures intermediate density, and is indeterminate. While this lesion is similar in size relative to recent studies, this is increased in size relative to prior study from 2012. Further evaluation with dedicated renal mass protocol CT and/or MRI is recommended for complete characterization.   12/2015 - 05/14/2016 Chemotherapy   Maintenance Herceptin and pejeta every 3 weeks stopped on 05/14/16 due to disease progression   12/25/2015 Imaging   Restaging PET Scan 1. Markedly improved skeletal activity, with only several faint foci of residual accentuated metabolic activity, but resolution of the vast majority of the previously extensive osseous metastatic disease. 2. Resolution of the prior right internal mammary and right prevascular lymph nodes. 3. Residual subcutaneous edema and edema along fascia planes in the left upper thigh. This remains somewhat more than I would expect for placement of an IM nail 6 weeks ago. If there is leg swelling further down, consider Doppler venous ultrasound to rule out left lower extremity DVT. I do not perceive an obvious difference in density between the pelvic and common femoral veins on the noncontrast CT data. 4. Chronic right maxillary and right sphenoid sinusitis.   01/16/2016 - 10/20/2017 Anti-estrogen oral therapy   Letrozole 2.5 mg daily. She was switched to exemestane due to disease progression on 02/08/17. Stopped on 10/20/2017 when treatment changed to chemo    03/01/2016 Miscellaneous   Patient presented to ED following a fall; she reports she landed on her  back and is now experiencing back pain. The patient was evaluated and discharge home same day with pain control.   05/01/2016 Imaging   CT CAP IMPRESSION: 1. Stable exam.  No new or progressive disease identified. 2. No evidence for residual or recurrent adenopathy within the chest. 3. Stable bone metastasis.   05/01/2016 Imaging   BONE SCAN IMPRESSION: 1. Small focal uptake involving the anterior rib ends of the left second and right fourth ribs, new since the prior bone scan. The location of this uptake is more suggestive of a traumatic or inflammatory etiology as opposed to metastatic disease. No other evidence of metastatic disease. 2. There are other areas of stable uptake which are most likely degenerative/reactive, including right shoulder and cervical spine uptake and uptake along the right femur adjacent to the ORIF hardware.   05/20/2016 Imaging   NM PET Skull to Thigh IMPRESSION: 1. There multiple metastatic foci in the bony pelvis, including some new abnormal foci compare to the prior PET-CT, compatible with mildly progressive bony metastatic disease. Also, a left T11 vertebral hypermetabolic metastatic lesion is observed, new compared to the prior exam. 2. No active extraosseous malignancy is currently identified. 3. Right anterior fourth rib healing fracture, appears be   06/04/2016 - 01/08/2017 Chemotherapy   Second line chemotherapy Kadcyla every 3 weeks started on 06/04/16 and stopped on 01/08/17 due to mixed response.     10/05/2016 Imaging   CT CAP and Bone Scan:  Bones: left intramedullary rod and left femoral head neck screw in place. In the proximal diaphysis of the left femur there is cortical thickening and irregularity. No lytic or blastic bone lesions. No fracture or vertebral endplate destruction.   No definite evidence  of recurrent disease in the chest, abdomen, or pelvis.   10/28/2016 Imaging   CT A/P IMPRESSION: No acute process demonstrated in  the abdomen or pelvis. No evidence of bowel obstruction or inflammation.   11/04/2016 Imaging   DG foot complete left: IMPRESSION: No fracture or dislocation.  No soft tissue abnormality   11/23/2016 Imaging   CT RIGHT FEMUR IMPRESSION: Negative CT scan of the right thigh.  No visible metastatic disease.  CT LEFT FEMUR IMPRESSION: 1. Postsurgical changes related to prior cephalomedullary rod fixation of the left femur with linear lucency along the anterolateral proximal femoral cortex at level of the lesser trochanter, suspicious for nondisplaced fracture. 2. Slightly permeative appearance of the proximal left femoral cortex at the site of known prior osseous metastases is largely unchanged. 3. Unchanged patchy lucency and sclerosis along the anterior acetabulum, which appears to correspond with an area of faint uptake on the prior PET-CT, suspicious for osseous metastasis. These results will be called to the ordering clinician or representative by the Radiologist Assistant, and communication documented in the PACS or zVision Dashboard.     01/27/2017 Imaging   IMPRESSION: 1. Mixed response to osseous metastasis. Some hypermetabolic lesions are new and increasingly hypermetabolic, while another index lesion is decreasingly hypermetabolic. 2. No evidence of hypermetabolic soft tissue metastasis.    02/08/2017 - 10/19/2017 Antibody Plan   -Herceptin every 3 week since 02/08/17 -Oral Lanpantinib 1562m and decreased to 5029mdue to diarrhea and depression. Due to disease progression she swithced to Verzenio 150104mn 05/04/17. She did not toelrate well so she was switched to Ibrance 100m32m 05/31/17. Due to her neutropenia, Ibrance dose reduced to 75 mg daily, 3 weeks on, one-week off, stopped on 10/20/2017 due to disease progression.    04/30/2017 PET scan   IMPRESSION: 1. Primarily increase in hypermetabolism of osseous metastasis. An isolated right acetabular lesion is  decreased in hypermetabolism since the prior exam. 2. No evidence of hypermetabolic soft tissue metastasis. 3. Similar non FDG avid right-sided thyroid nodule, favoring a benign etiology. Recommend attention on follow-up.   08/30/2017 PET scan   08/30/2017 PET Scan  IMPRESSION: 1. Worsening osseous metastatic disease. 2. Focal hypermetabolism in the central spinal canal the L1 level, similar to the prior exam. Metastatic implant cannot be excluded. 3. Slight enlargement of a right thyroid nodule which now appears more cystic in character. Consider further evaluation with thyroid ultrasound. If patient is clinically hyperthyroid, consider nuclear medicine thyroid uptake and scan   08/30/2017 Progression   08/30/2017 PET Scan  IMPRESSION: 1. Worsening osseous metastatic disease. 2. Focal hypermetabolism in the central spinal canal the L1 level, similar to the prior exam. Metastatic implant cannot be excluded. 3. Slight enlargement of a right thyroid nodule which now appears more cystic in character. Consider further evaluation with thyroid ultrasound. If patient is clinically hyperthyroid, consider nuclear medicine thyroid uptake and scan   09/29/2017 - 10/11/2017 Radiation Therapy   Pt received 27 Gy in 9 fractions directed at the areas of significant uptake noted on recent bone scan the right pelvis and right proximal femur, with Dr, KinaSondra Come10/09/2017 - 04/07/2018 Chemotherapy   -Herceptin every 3 weeks starting 02/08/17, perjeta added on 10/20/2017  -weekly Taxol started on 10/21/2017. Will reduce Taxol to 2 weeks on, 1 week off starting on 02/04/17.  Stoped due to mild disease progression   10/27/2017 Imaging   10/27/2017 CT AP IMPRESSION: 1. No acute abnormality. No evidence of bowel obstruction or  acute bowel inflammation. Normal appendix. 2. Stable patchy sclerotic osseous metastases throughout the visualized skeleton. No new or progressive metastatic disease in the abdomen  or pelvis.   01/11/2018 Imaging   Whole Body Bone Scan 01/11/18  IMPRESSION: 1. Multifocal osseous metastases as described. 2. Right scapular metastasis. 3. Right superior thoracic spinous process. 4. Right sacral ala and right L5 metastases. 5. Right acetabular metastasis. 6. Right proximal femur metastasis. 7. Diffuse metastases throughout the left femur. 8. Anterior right fourth rib metastasis.   01/11/2018 Imaging   CT CAP W Contrast 01/11/18  IMPRESSION: 1. Similar-appearing osseous metastatic disease. 2. No evidence for progressed metastatic disease in the chest, abdomen or pelvis.   03/30/2018 Imaging   Bone Scan 03/30/18  IMPRESSION: Stable multifocal bony metastatic disease.   03/30/2018 Imaging    CT CAP W Contrast 03/30/18 IMPRESSION: 1. New mild contiguous wall thickening with associated mucosal hyperenhancement throughout the left colon and rectum, suggesting nonspecific infectious or inflammatory proctocolitis. Differential includes C diff colitis. 2. Stable faint patchy sclerosis in the iliac bones and lumbar vertebral bodies. No new or progressive osseous metastatic disease by CT. Please note that the osseous lesions are relatively occult by CT and would be better evaluated by PET-CT, as clinically warranted. 3. No evidence of extra osseous metastatic disease.  These results will be called to the ordering clinician or representative by the Radiologist Assistant, and communication documented in the PACS or zVision Dashboard.   04/15/2018 - 06/16/2018 Chemotherapy   Enhertu q3weeks starting 04/15/18. Stopped Enhertu after cycle 3 on 05/27/18 due to poor toleration due to poor toleration with anorexia, weight loss, N&V, diarrhea   04/21/2018 PET scan   PET 04/21/18  IMPRESSION: 1. Overall marked interval improvement in bony hypermetabolic disease. All of the previously identified hypermetabolic osseous metastases have decreased in hypermetabolism in the interval.  There is a new tiny focus of hypermetabolism in the right aspect of the L3 vertebral body that appears to correspond to a new 8 mm lucency, raising concern for new metastatic disease. Close attention on follow-up recommended. 2. Focal hypermetabolism in the central spinal canal at the level of L1 previously has resolved. 3. No hypermetabolic soft tissue disease on today's study. 4. Right thyroid nodule seen previously has decreased in the interval.    06/16/2018 Imaging   CT LUMBAR SPINE WO CONTRAST 06/16/18 IMPRESSION: 1. Transitional lumbosacral anatomy. Correlation with radiographs is recommended prior to any operative intervention. 2. Suspicion of a right L3 vertebral body metastasis on the April PET-CT is not evident by CT. Stable degenerative versus metastatic lesion at the right L5 superior endplate. Small but increased right iliac bone metastasis since March. Lumbar MRI would best characterize lumbar metastatic disease (without contrast if necessary). 3. Suspect L2-L3 left subarticular segment disc herniation. Query left L2 radiculitis. 4. Stable appearance of rightward L4-L5 disc degeneration which could affect the right foramen and right lateral recess.   06/17/2018 - 11/16/2018 Chemotherapy   Restart Taxol 2 weeks on/1 week off with Herceptin and Perjeta q3weeks on 06/17/18. Perjeta was stopped after C5 due to diarrhea. Stopped Taxol after 11/16/18 due to disease progression to L3.    07/06/2018 PET scan   PET  IMPRESSION: 1. Near total resolution of hypermetabolic activity associated with skeletal metastasis. Very mild activity associated with the LEFT iliac bone which is similar to background blood pool activity. 2. No new or progressive disease.   11/21/2018 PET scan   IMPRESSION: 1. New focal hypermetabolic lesion eccentric to  the right in the L3 vertebral body, maximum SUV 6.1 compatible with a new focus of active malignancy. 2. Very low-grade activity similar to  blood pool in previous existing mildly sclerotic metastatic lesions including the left iliac bone lesion. Some of the mildly sclerotic lesions are not hypermetabolic compatible with effectively treated bony metastatic disease. 3. No hypermetabolic soft tissue lesions are identified.     11/30/2018 -  Chemotherapy   Continue Herceptin q3weeks    12/12/2018 - 12/17/2018 Chemotherapy   Tucatinib (TUKYSA) 6 tabs BID as a next line therapy starting 12/12/18. Stopped 12/17/18 dueto N&V, poor appetite and HA       CURRENT THERAPY:  -Tucatinib (TUKYSA) 6 tabs BID as a next line therapy starting 12/05/2018, stopped 12/5 due to poor tolerance.  -Continue Herceptin q3weeks  -will start Eribulin on day 1, 8 every 21 days today   INTERVAL HISTORY:  [name] is here for a follow up. She presents to the clinic with her interpretor. She stopped the medication Tucatinib on Saturday on 12/5 and feels better since stopping. She had nausea, HA and felt grouchy. She would like to alter her regimen and rather not be on oral medications. She notes she had an injection before for growth factor and was painful to her and apprehensive about doing an injection again.  She notes her Left leg pain is stable and swells at times. She has no complaints of her slight back pain. She notes she still takes Potassium 2-3 times daily and still takes Ambien as needed. She will take Oxycodone as needed for her leg pain.     REVIEW OF SYSTEMS:   Constitutional: Denies fevers, chills or abnormal weight loss Eyes: Denies blurriness of vision Ears, nose, mouth, throat, and face: Denies mucositis or sore throat Respiratory: Denies cough, dyspnea or wheezes Cardiovascular: Denies palpitation, chest discomfort or lower extremity swelling Gastrointestinal:  Denies nausea, heartburn or change in bowel habits Skin: Denies abnormal skin rashes Lymphatics: Denies new lymphadenopathy or easy bruising MSK: (+) Slight back  pain and stable left leg pain  Neurological:Denies numbness, tingling or new weaknesses Behavioral/Psych: Mood is stable, no new changes  All other systems were reviewed with the patient and are negative.  MEDICAL HISTORY:  Past Medical History:  Diagnosis Date   Abnormal Pap smear    Anxiety    Breast cancer ([place])    Cancer ([place]) 1999   breast-s/p mastectomy, chemo, rad   CIN I (cervical intraepithelial neoplasia I) 2003   by colpo   Congenital deafness    Deaf    Depression    Port-A-Cath in place 2017   for chemotherapy   Radiation 07/12/15-07/26/15   left femur 30 Gy   S/P bilateral oophorectomy     SURGICAL HISTORY: Past Surgical History:  Procedure Laterality Date   ABDOMINAL HYSTERECTOMY     INTRAMEDULLARY (IM) NAIL INTERTROCHANTERIC Left 06/26/2015   Procedure: LEFT BIOMET LONG AFFIXS NAIL;  Surgeon: [name], MD;  Location: [facility];  Service: Orthopedics;  Laterality: Left;   IR GENERIC HISTORICAL  08/06/2015   IR US GUIDE VASC ACCESS LEFT 08/06/2015 Aletta Edouard, MD [facility]   IR GENERIC HISTORICAL  08/06/2015   IR FLUORO GUIDE CV LINE LEFT 08/06/2015 Aletta Edouard, MD [facility]   IR GENERIC HISTORICAL  03/05/2016   IR CV LINE INJECTION 03/05/2016 Markus Daft, MD [facility]   LEFT HEART CATH AND CORONARY ANGIOGRAPHY N/A 12/13/2017   Procedure: LEFT HEART CATH AND CORONARY  ANGIOGRAPHY;  Surgeon: Leonie Man, MD;  Location: [facility];  Service: Cardiovascular;  Laterality: N/A;   MASTECTOMY     right breast   MASTECTOMY     OVARIAN CYST REMOVAL     RADIOLOGY WITH ANESTHESIA Left 06/25/2015   Procedure: MRI OF LEFT HIP WITH OR WITHOUT CONTRAST;  Surgeon: Medication Radiologist, MD;  Location: [facility];  Service: Radiology;  Laterality: Left;  DR. MCINTYRE/MRI   TUBAL LIGATION      I have reviewed the social history and family history with the patient and they are unchanged from previous note.  ALLERGIES:  is allergic to  promethazine hcl; chlorhexidine; and diphenhydramine hcl.  MEDICATIONS:  Current Outpatient Medications  Medication Sig Dispense Refill   albuterol (PROAIR HFA) 108 (90 Base) MCG/ACT inhaler Inhale 2 puffs into the lungs every 6 (six) hours as needed for wheezing or shortness of breath.     albuterol (PROVENTIL HFA;VENTOLIN HFA) 108 (90 Base) MCG/ACT inhaler Inhale 2 puffs into the lungs every 6 (six) hours as needed for wheezing or shortness of breath. 1 Inhaler 0   ALPRAZolam (XANAX) 0.25 MG tablet Take 1 tablet (0.25 mg total) by mouth 2 (two) times daily as needed for anxiety. 30 tablet 0   diphenoxylate-atropine (LOMOTIL) 2.5-0.025 MG tablet Take 1-2 tablets by mouth 4 (four) times daily as needed for diarrhea or loose stools. 30 tablet 1   ergocalciferol (VITAMIN D2) 1.25 MG (50000 UT) capsule Take 1 capsule (50,000 Units total) by mouth once a week. 8 capsule 0   lidocaine-prilocaine (EMLA) cream Apply 1 application topically as needed. Apply to Porta-Cath 1-2 hours prior to access as directed. (Patient taking differently: Apply 1 application topically as needed (To Porta-Cath 1-2 hours prior to access as directed). ) 90 g 2   magnesium oxide (MAG-OX) 400 (241.3 Mg) MG tablet Take 1 tablet (400 mg total) by mouth daily. 30 tablet 3   methocarbamol (ROBAXIN) 500 MG tablet Take 1 tablet (500 mg total) by mouth 2 (two) times daily. (Patient taking differently: Take 500 mg by mouth 2 (two) times daily as needed for muscle spasms. ) 20 tablet 0   omeprazole (PRILOSEC) 20 MG capsule Take 1 capsule (20 mg total) by mouth daily. 30 capsule 0   ondansetron (ZOFRAN) 8 MG tablet Take 1 tablet (8 mg total) by mouth every 8 (eight) hours as needed for nausea or vomiting. 45 tablet 1   oxyCODONE (ROXICODONE) 5 MG immediate release tablet Take 1 tablet (5 mg total) by mouth every 8 (eight) hours as needed for severe pain. 60 tablet 0   potassium chloride (KLOR-CON) 10 MEQ tablet Take 3 tablets  (30 mEq total) by mouth daily. 90 tablet 0   prochlorperazine (COMPAZINE) 10 MG tablet Take 1 tablet (10 mg total) by mouth every 6 (six) hours as needed for nausea or vomiting. 60 tablet 2   zolpidem (AMBIEN) 5 MG tablet Take 1-2 tablets (5-10 mg total) by mouth at bedtime as needed. 60 tablet 0   No current facility-administered medications for this visit.    Facility-Administered Medications Ordered in Other Visits  Medication Dose Route Frequency Provider Last Rate Last Dose   acetaminophen (TYLENOL) tablet 650 mg  650 mg Oral Once Truitt Merle, MD       heparin lock flush 100 unit/mL  500 Units Intracatheter Once PRN Truitt Merle, MD       loratadine (CLARITIN) tablet 10 mg  10 mg Oral Once Truitt Merle, MD  potassium chloride SA (K-DUR) CR tablet 40 mEq  40 mEq Oral Once Truitt Merle, MD       sodium chloride 0.9 % 1,000 mL with potassium chloride 10 mEq infusion   Intravenous Continuous Livesay, Tamala Julian, MD   Stopped at 09/10/15 1653   sodium chloride 0.9 % injection 10 mL  10 mL Intravenous PRN Livesay, Lennis P, MD       sodium chloride flush (NS) 0.9 % injection 10 mL  10 mL Intracatheter PRN Truitt Merle, MD       trastuzumab-dkst (OGIVRI) 357 mg in sodium chloride 0.9 % 250 mL chemo infusion  6 mg/kg (Treatment Plan Recorded) Intravenous Once Truitt Merle, MD        PHYSICAL EXAMINATION: ECOG PERFORMANCE STATUS: 1 - Symptomatic but completely ambulatory  Vitals:   12/21/18 0825  BP: 118/74  Pulse: 65  Resp: 17  Temp: 97.6 F (36.4 C)  SpO2: 100%   Filed Weights   12/21/18 0825  Weight: 140 lb 11.2 oz (63.8 kg)   GENERAL:alert, no distress and comfortable SKIN: skin color, texture, turgor are normal, no rashes or significant lesions EYES: normal, Conjunctiva are pink and non-injected, sclera clear NECK: supple, thyroid normal size, non-tender, without nodularity LYMPH:  no palpable lymphadenopathy in the cervical, axillary  LUNGS: clear to auscultation and percussion  with normal breathing effort HEART: regular rate & rhythm and no murmurs and no lower extremity edema ABDOMEN:abdomen soft, non-tender and normal bowel sounds Musculoskeletal:no cyanosis of digits and no clubbing  NEURO: alert & oriented x 3 with fluent speech, no focal motor/sensory deficits  LABORATORY DATA:  I have reviewed the data as listed CBC Latest Ref Rng & Units 12/21/2018 11/30/2018 11/16/2018  WBC 4.0 - 10.5 K/uL 4.0 3.8(L) 3.6(L)  Hemoglobin 12.0 - 15.0 g/dL 10.6(L) 10.5(L) 10.6(L)  Hematocrit 36.0 - 46.0 % 31.3(L) 31.0(L) 31.1(L)  Platelets 150 - 400 K/uL 176 182 203     CMP Latest Ref Rng & Units 12/21/2018 11/30/2018 11/16/2018  Glucose 70 - 99 mg/dL 87 91 77  BUN 6 - 20 mg/dL _0 Creatinine 0.44 - 1.00 mg/dL 0.79 0.80 0.83  Sodium 135 - 145 mmol/L 142 141 140  Potassium 3.5 - 5.1 mmol/L 3.6 3.4(L) 3.5  Chloride 98 - 111 mmol/L 108 107 107  CO2 22 - 32 mmol/L 24 21(L) 24  Calcium 8.9 - 10.3 mg/dL 8.7(L) 9.0 9.1  Total Protein 6.5 - 8.1 g/dL 6.1(L) 6.3(L) 6.3(L)  Total Bilirubin 0.3 - 1.2 mg/dL 1.5(H) 1.9(H) 1.7(H)  Alkaline Phos 38 - 126 U/L 51 52 52  AST 15 - 41 U/L 15 15 13(L)  ALT 0 - 44 U/L _1 RADIOGRAPHIC STUDIES: I have personally reviewed the radiological images as listed and agreed with the findings in the report. No results found.   ASSESSMENT & PLAN:  LATIMA HAMZA is a 53 y.o. 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 She had right mastectomy, 4 cycles of AC-T and 5 years of Tamoxifen/AI.  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 She has received Target RT.  She had excellent response to First-line Docetaxel/Herceptin/Perjeta and AI but progressed on maintenance therapy. She had mixed response on second line Kadcyla, progressed on third-line Ibrance/Herceptin and poorly tolerated  Enhertu.  -She was being treated with  Taxol/Herceptin/Perjeta q3weeks since 06/17/18. Perjeta was stopped after C5 due to diarrhea then to recent disease progression in L3 lesions based on 11/21/18 PET.  -I recommended next line therapy with Tucatinib (TUKYSA) 63m 6 tabs BID as a next line therapy and continuing Herceptin q3weeks. She started Tucatinib on 12/12/18 however due to N&V, poor appetite and HA she stopped on 12/17/18 after trying twice. -I discussed other treatment options includes other oral HER2 antibody Neratinib or Fulvestrant injection which I recommend, or iv chemo. She declined any oral or injection agents, due to her fare of injection related pain, and her previous poor tolerance to other oral her2 antibodies. I had long discussion with her but she refused  -I discussed chemo options, will recommend iv Eribulin on day 1, 8, every 21 days --Chemotherapy consent: Side effects including but does not not limited to, fatigue, nausea, vomiting, diarrhea, hair loss, neuropathy, fluid retention, renal and kidney dysfunction, neutropenic fever, needed for blood transfusion, bleeding, were discussed with patient in great detail. She agrees to proceed. -the goal of therapy is palliative  -she agrees to proceed, her insurance does not require preauth, will start today   2.Body pain, secondary to bone mets, Hypocalcemia, leg cramps  -Since her cancer diagnosis,she has had b/l hip, shoulder and chest painat different time points, likely related to her bone mets -She declined Gabapentin given risk of suicidal thoughts. -She is being treated with Zometa q358month She does have hypocalcemia with leg cramps secondary to Zometa.  -She will continue oral calcium and Vit D.  -She now developed mild low back pain, responding to the L3 new bone lesion, will continue pain medication, she is starting new therapy -We discussed the role of palliative radiation, if her back pain gets worse. -Stable    3.Congenital deafness  4.Mild anemia, secondary to chemotherapy and malignancy -She will start with oral iron daily for 1-2 months and then stop. She will also start daily women's vitamin. -Stable mild anemia  5.Hypokalemia, Diarrhea -She developed diarrhea s/p cycle 3 Perjeta.Has been d/c -Istarted her onMag 4006maily and Oral potassium 11m74m-3 times a day. Given she does not take potassium TID consistently I encouraged her to increase potassium in her diet. She is agreeable.  6.Goal of care discussion -she understand her goal of care is palliative to prolong her life -she is full code for now  7.Insomnia -She cannot take Benadryl due to allergy reaction. She has tried trazodone with no relief.  -She is on Xanax and Ambien 1-2 tabs nightly. 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 She declined using ice bags using infusion -stable    PLAN: -she has stopped Tucatinib due to poor tolerance  -will start Eribulin today, second dose next week   -Labs reviewed and adequate to proceed with Herceptin today.  -Lab, flush and Herceptin/Eribulin in 3 weeks    No problem-specific Assessment & Plan notes found for this encounter.   No orders of the defined types were placed in this encounter.  All questions were answered. The patient knows to call the clinic with any problems, questions or concerns. No barriers to learning was detected. I spent 25 minutes counseling the patient face to face. The total time spent in the appointment was 30 minutes and more than 50% was on counseling and review of test results      Truitt Merle 12/21/2018   I, AmoyJoslyn Devon acting as scribe for  Truitt Merle.   I have reviewed the above  documentation for accuracy and completeness, and I agree with the above.

## 2018-12-21 ENCOUNTER — Encounter: Payer: Self-pay | Admitting: Hematology

## 2018-12-21 ENCOUNTER — Inpatient Hospital Stay (HOSPITAL_BASED_OUTPATIENT_CLINIC_OR_DEPARTMENT_OTHER): Payer: Medicaid Other | Admitting: Hematology

## 2018-12-21 ENCOUNTER — Inpatient Hospital Stay: Payer: Medicaid Other

## 2018-12-21 ENCOUNTER — Other Ambulatory Visit: Payer: Self-pay

## 2018-12-21 ENCOUNTER — Inpatient Hospital Stay: Payer: Medicaid Other | Attending: Hematology

## 2018-12-21 DIAGNOSIS — Z5111 Encounter for antineoplastic chemotherapy: Secondary | ICD-10-CM | POA: Insufficient documentation

## 2018-12-21 DIAGNOSIS — C50911 Malignant neoplasm of unspecified site of right female breast: Secondary | ICD-10-CM

## 2018-12-21 DIAGNOSIS — Z170 Estrogen receptor positive status [ER+]: Secondary | ICD-10-CM | POA: Insufficient documentation

## 2018-12-21 DIAGNOSIS — H905 Unspecified sensorineural hearing loss: Secondary | ICD-10-CM | POA: Diagnosis not present

## 2018-12-21 DIAGNOSIS — E876 Hypokalemia: Secondary | ICD-10-CM

## 2018-12-21 DIAGNOSIS — G620 Drug-induced polyneuropathy: Secondary | ICD-10-CM | POA: Diagnosis not present

## 2018-12-21 DIAGNOSIS — C7951 Secondary malignant neoplasm of bone: Secondary | ICD-10-CM

## 2018-12-21 DIAGNOSIS — Z5112 Encounter for antineoplastic immunotherapy: Secondary | ICD-10-CM | POA: Insufficient documentation

## 2018-12-21 DIAGNOSIS — D6481 Anemia due to antineoplastic chemotherapy: Secondary | ICD-10-CM | POA: Insufficient documentation

## 2018-12-21 DIAGNOSIS — Z95828 Presence of other vascular implants and grafts: Secondary | ICD-10-CM

## 2018-12-21 DIAGNOSIS — G4700 Insomnia, unspecified: Secondary | ICD-10-CM | POA: Insufficient documentation

## 2018-12-21 DIAGNOSIS — G893 Neoplasm related pain (acute) (chronic): Secondary | ICD-10-CM | POA: Insufficient documentation

## 2018-12-21 DIAGNOSIS — C799 Secondary malignant neoplasm of unspecified site: Secondary | ICD-10-CM

## 2018-12-21 LAB — CBC WITH DIFFERENTIAL (CANCER CENTER ONLY)
Abs Immature Granulocytes: 0.02 10*3/uL (ref 0.00–0.07)
Basophils Absolute: 0 10*3/uL (ref 0.0–0.1)
Basophils Relative: 1 %
Eosinophils Absolute: 0.1 10*3/uL (ref 0.0–0.5)
Eosinophils Relative: 3 %
HCT: 31.3 % — ABNORMAL LOW (ref 36.0–46.0)
Hemoglobin: 10.6 g/dL — ABNORMAL LOW (ref 12.0–15.0)
Immature Granulocytes: 1 %
Lymphocytes Relative: 25 %
Lymphs Abs: 1 10*3/uL (ref 0.7–4.0)
MCH: 30.3 pg (ref 26.0–34.0)
MCHC: 33.9 g/dL (ref 30.0–36.0)
MCV: 89.4 fL (ref 80.0–100.0)
Monocytes Absolute: 0.4 10*3/uL (ref 0.1–1.0)
Monocytes Relative: 10 %
Neutro Abs: 2.5 10*3/uL (ref 1.7–7.7)
Neutrophils Relative %: 60 %
Platelet Count: 176 10*3/uL (ref 150–400)
RBC: 3.5 MIL/uL — ABNORMAL LOW (ref 3.87–5.11)
RDW: 13.5 % (ref 11.5–15.5)
WBC Count: 4 10*3/uL (ref 4.0–10.5)
nRBC: 0 % (ref 0.0–0.2)

## 2018-12-21 LAB — CMP (CANCER CENTER ONLY)
ALT: 13 U/L (ref 0–44)
AST: 15 U/L (ref 15–41)
Albumin: 3.7 g/dL (ref 3.5–5.0)
Alkaline Phosphatase: 51 U/L (ref 38–126)
Anion gap: 10 (ref 5–15)
BUN: 10 mg/dL (ref 6–20)
CO2: 24 mmol/L (ref 22–32)
Calcium: 8.7 mg/dL — ABNORMAL LOW (ref 8.9–10.3)
Chloride: 108 mmol/L (ref 98–111)
Creatinine: 0.79 mg/dL (ref 0.44–1.00)
GFR, Est AFR Am: >60 mL/min (ref >60)
GFR, Estimated: >60 mL/min (ref >60)
Glucose, Bld: 87 mg/dL (ref 70–99)
Potassium: 3.6 mmol/L (ref 3.5–5.1)
Sodium: 142 mmol/L (ref 135–145)
Total Bilirubin: 1.5 mg/dL — ABNORMAL HIGH (ref 0.3–1.2)
Total Protein: 6.1 g/dL — ABNORMAL LOW (ref 6.5–8.1)

## 2018-12-21 LAB — MAGNESIUM: Magnesium: 1.7 mg/dL (ref 1.7–2.4)

## 2018-12-21 MED ORDER — LORATADINE 10 MG PO TABS
10.0000 mg | ORAL_TABLET | Freq: Once | ORAL | Status: AC
  Administered 2018-12-21: 10 mg via ORAL

## 2018-12-21 MED ORDER — SODIUM CHLORIDE 0.9% FLUSH
10.0000 mL | INTRAVENOUS | Status: DC | PRN
  Administered 2018-12-21: 10 mL
  Filled 2018-12-21: qty 10

## 2018-12-21 MED ORDER — TRASTUZUMAB-DKST CHEMO 150 MG IV SOLR
6.0000 mg/kg | Freq: Once | INTRAVENOUS | Status: AC
  Administered 2018-12-21: 357 mg via INTRAVENOUS
  Filled 2018-12-21: qty 17

## 2018-12-21 MED ORDER — LORATADINE 10 MG PO TABS
ORAL_TABLET | ORAL | Status: AC
  Filled 2018-12-21: qty 1

## 2018-12-21 MED ORDER — SODIUM CHLORIDE 0.9 % IV SOLN
1.4000 mg/m2 | Freq: Once | INTRAVENOUS | Status: AC
  Administered 2018-12-21: 2.4 mg via INTRAVENOUS
  Filled 2018-12-21: qty 4.8

## 2018-12-21 MED ORDER — ACETAMINOPHEN 325 MG PO TABS
650.0000 mg | ORAL_TABLET | Freq: Once | ORAL | Status: AC
  Administered 2018-12-21: 650 mg via ORAL

## 2018-12-21 MED ORDER — SODIUM CHLORIDE 0.9% FLUSH
10.0000 mL | Freq: Once | INTRAVENOUS | Status: DC
  Filled 2018-12-21: qty 10

## 2018-12-21 MED ORDER — SODIUM CHLORIDE 0.9 % IJ SOLN: 10.0000 mL | INTRAMUSCULAR | Status: DC | PRN

## 2018-12-21 MED ORDER — ACETAMINOPHEN 325 MG PO TABS
ORAL_TABLET | ORAL | Status: AC
  Filled 2018-12-21: qty 2

## 2018-12-21 MED ORDER — HEPARIN SOD (PORK) LOCK FLUSH 100 UNIT/ML IV SOLN
500.0000 [IU] | Freq: Once | INTRAVENOUS | Status: AC | PRN
  Administered 2018-12-21: 500 [IU]
  Filled 2018-12-21: qty 5

## 2018-12-21 MED ORDER — SODIUM CHLORIDE 0.9% FLUSH
10.0000 mL | Freq: Once | INTRAVENOUS | Status: AC
  Administered 2018-12-21: 10 mL via INTRAVENOUS
  Filled 2018-12-21: qty 10

## 2018-12-21 MED ORDER — SODIUM CHLORIDE 0.9 % IV SOLN
Freq: Once | INTRAVENOUS | Status: AC
  Administered 2018-12-21: 09:00:00 via INTRAVENOUS
  Filled 2018-12-21: qty 250

## 2018-12-21 MED ORDER — FULVESTRANT 250 MG/5ML IM SOLN
INTRAMUSCULAR | Status: AC
  Filled 2018-12-21: qty 5

## 2018-12-21 MED ORDER — POTASSIUM CHLORIDE ER 10 MEQ PO TBCR: 30.0000 meq | EXTENDED_RELEASE_TABLET | Freq: Every day | ORAL | 0 refills | Status: DC

## 2018-12-21 NOTE — Patient Instructions (Signed)
[facility] °Discharge Instructions for Patients Receiving Chemotherapy ° °Today you received the following chemotherapy agents Trastuzumab ° °To help prevent nausea and vomiting after your treatment, we encourage you to take your nausea medication as directed. °  °If you develop nausea and vomiting that is not controlled by your nausea medication, call the clinic.  ° °BELOW ARE SYMPTOMS THAT SHOULD BE REPORTED IMMEDIATELY: °· *FEVER GREATER THAN 100.5 F °· *CHILLS WITH OR WITHOUT FEVER °· NAUSEA AND VOMITING THAT IS NOT CONTROLLED WITH YOUR NAUSEA MEDICATION °· *UNUSUAL SHORTNESS OF BREATH °· *UNUSUAL BRUISING OR BLEEDING °· TENDERNESS IN MOUTH AND THROAT WITH OR WITHOUT PRESENCE OF ULCERS °· *URINARY PROBLEMS °· *BOWEL PROBLEMS °· UNUSUAL RASH °Items with * indicate a potential emergency and should be followed up as soon as possible. ° °Feel free to call the clinic should you have any questions or concerns. The clinic phone number is [phone]. ° °Please show the CHEMO ALERT CARD at check-in to the Emergency Department and triage nurse. ° ° °

## 2018-12-21 NOTE — Patient Instructions (Signed)

## 2018-12-21 NOTE — Progress Notes (Signed)
DISCONTINUE ON PATHWAY REGIMEN - Breast  No Medical Intervention - Off Treatment.  REASON: Disease Progression PRIOR TREATMENT: Off Treatment  START ON PATHWAY REGIMEN - Breast     A cycle is every 21 days:     Eribulin mesylate      Trastuzumab-xxxx      Trastuzumab-xxxx   **Always confirm dose/schedule in your pharmacy ordering system**  Patient Characteristics: Distant Metastases or Locoregional Recurrent Disease - Unresected or Locally Advanced Unresectable Disease Progressing after Neoadjuvant and Local Therapies, HER2 Positive, ER Positive, Chemotherapy + HER2-Targeted Therapy, Fourth Line and Beyond Therapeutic Status: Distant Metastases BRCA Mutation Status: Absent ER Status: Positive (+) HER2 Status: Positive (+) PR Status: Negative (-) Line of Therapy: Fourth Line and Beyond Intent of Therapy: Non-Curative / Palliative Intent, Discussed with Patient

## 2018-12-21 NOTE — Progress Notes (Signed)
Pt took home dose of compazine as halaven premed [id number].

## 2018-12-22 ENCOUNTER — Telehealth: Payer: Self-pay | Admitting: *Deleted

## 2018-12-22 LAB — CANCER ANTIGEN 27.29: CA 27.29: 29.6 U/mL (ref 0.0–38.6)

## 2018-12-23 ENCOUNTER — Telehealth: Payer: Self-pay | Admitting: Hematology

## 2018-12-23 NOTE — Telephone Encounter (Signed)
Scheduled appt per 12/10 sch message - pt aware of appt date and time and letter mailed

## 2018-12-28 ENCOUNTER — Inpatient Hospital Stay: Payer: Medicaid Other

## 2018-12-28 ENCOUNTER — Other Ambulatory Visit: Payer: Self-pay

## 2018-12-28 VITALS — BP 133/72 | HR 68 | Temp 97.8°F | Resp 18

## 2018-12-28 DIAGNOSIS — Z5112 Encounter for antineoplastic immunotherapy: Secondary | ICD-10-CM | POA: Diagnosis not present

## 2018-12-28 DIAGNOSIS — C50911 Malignant neoplasm of unspecified site of right female breast: Secondary | ICD-10-CM

## 2018-12-28 DIAGNOSIS — C7951 Secondary malignant neoplasm of bone: Secondary | ICD-10-CM

## 2018-12-28 DIAGNOSIS — Z95828 Presence of other vascular implants and grafts: Secondary | ICD-10-CM

## 2018-12-28 LAB — CBC WITH DIFFERENTIAL (CANCER CENTER ONLY)
Abs Immature Granulocytes: 0.09 10*3/uL — ABNORMAL HIGH (ref 0.00–0.07)
Basophils Absolute: 0.1 10*3/uL (ref 0.0–0.1)
Basophils Relative: 2 %
Eosinophils Absolute: 0 10*3/uL (ref 0.0–0.5)
Eosinophils Relative: 1 %
HCT: 29.4 % — ABNORMAL LOW (ref 36.0–46.0)
Hemoglobin: 10.1 g/dL — ABNORMAL LOW (ref 12.0–15.0)
Immature Granulocytes: 3 %
Lymphocytes Relative: 29 %
Lymphs Abs: 1 10*3/uL (ref 0.7–4.0)
MCH: 30.6 pg (ref 26.0–34.0)
MCHC: 34.4 g/dL (ref 30.0–36.0)
MCV: 89.1 fL (ref 80.0–100.0)
Monocytes Absolute: 0.2 10*3/uL (ref 0.1–1.0)
Monocytes Relative: 6 %
Neutro Abs: 2 10*3/uL (ref 1.7–7.7)
Neutrophils Relative %: 59 %
Platelet Count: 190 10*3/uL (ref 150–400)
RBC: 3.3 MIL/uL — ABNORMAL LOW (ref 3.87–5.11)
RDW: 13.2 % (ref 11.5–15.5)
WBC Count: 3.3 10*3/uL — ABNORMAL LOW (ref 4.0–10.5)
nRBC: 0 % (ref 0.0–0.2)

## 2018-12-28 LAB — CMP (CANCER CENTER ONLY)
ALT: 41 U/L (ref 0–44)
AST: 26 U/L (ref 15–41)
Albumin: 3.7 g/dL (ref 3.5–5.0)
Alkaline Phosphatase: 54 U/L (ref 38–126)
Anion gap: 11 (ref 5–15)
BUN: 5 mg/dL — ABNORMAL LOW (ref 6–20)
CO2: 26 mmol/L (ref 22–32)
Calcium: 9.9 mg/dL (ref 8.9–10.3)
Chloride: 105 mmol/L (ref 98–111)
Creatinine: 0.81 mg/dL (ref 0.44–1.00)
GFR, Est AFR Am: >60 mL/min (ref >60)
GFR, Estimated: >60 mL/min (ref >60)
Glucose, Bld: 90 mg/dL (ref 70–99)
Potassium: 3.4 mmol/L — ABNORMAL LOW (ref 3.5–5.1)
Sodium: 142 mmol/L (ref 135–145)
Total Bilirubin: 1.2 mg/dL (ref 0.3–1.2)
Total Protein: 6.2 g/dL — ABNORMAL LOW (ref 6.5–8.1)

## 2018-12-28 MED ORDER — SODIUM CHLORIDE 0.9% FLUSH
10.0000 mL | Freq: Once | INTRAVENOUS | Status: AC
  Administered 2018-12-28: 10 mL
  Filled 2018-12-28: qty 10

## 2018-12-28 MED ORDER — HEPARIN SOD (PORK) LOCK FLUSH 100 UNIT/ML IV SOLN
500.0000 [IU] | Freq: Once | INTRAVENOUS | Status: AC | PRN
  Administered 2018-12-28: 10:00:00 500 [IU]
  Filled 2018-12-28: qty 5

## 2018-12-28 MED ORDER — SODIUM CHLORIDE 0.9% FLUSH
10.0000 mL | INTRAVENOUS | Status: DC | PRN
  Administered 2018-12-28: 10 mL
  Filled 2018-12-28: qty 10

## 2018-12-28 MED ORDER — PROCHLORPERAZINE MALEATE 10 MG PO TABS: 10.0000 mg | ORAL_TABLET | Freq: Once | ORAL | Status: DC

## 2018-12-28 MED ORDER — SODIUM CHLORIDE 0.9 % IV SOLN
1.4000 mg/m2 | Freq: Once | INTRAVENOUS | Status: AC
  Administered 2018-12-28: 2.4 mg via INTRAVENOUS
  Filled 2018-12-28: qty 4.8

## 2018-12-28 MED ORDER — SODIUM CHLORIDE 0.9 % IV SOLN
Freq: Once | INTRAVENOUS | Status: AC
  Filled 2018-12-28: qty 250

## 2018-12-28 NOTE — Patient Instructions (Signed)
[facility] Discharge Instructions for Patients Receiving Chemotherapy  Today you received the following chemotherapy agents Halaven  To help prevent nausea and vomiting after your treatment, we encourage you to take your nausea medication as directed If you develop nausea and vomiting that is not controlled by your nausea medication, call the clinic.   BELOW ARE SYMPTOMS THAT SHOULD BE REPORTED IMMEDIATELY:  *FEVER GREATER THAN 100.5 F  *CHILLS WITH OR WITHOUT FEVER  NAUSEA AND VOMITING THAT IS NOT CONTROLLED WITH YOUR NAUSEA MEDICATION  *UNUSUAL SHORTNESS OF BREATH  *UNUSUAL BRUISING OR BLEEDING  TENDERNESS IN MOUTH AND THROAT WITH OR WITHOUT PRESENCE OF ULCERS  *URINARY PROBLEMS  *BOWEL PROBLEMS  UNUSUAL RASH Items with * indicate a potential emergency and should be followed up as soon as possible.  Feel free to call the clinic should you have any questions or concerns. The clinic phone number is [phone].  Please show the CHEMO ALERT CARD at check-in to the Emergency Department and triage nurse.   

## 2018-12-28 NOTE — Patient Instructions (Signed)

## 2019-01-02 ENCOUNTER — Emergency Department (HOSPITAL_COMMUNITY)
Admission: EM | Admit: 2019-01-02 | Discharge: 2019-01-03 | Disposition: A | Discharge status: Home / Self Care | Payer: Medicaid Other | Attending: Emergency Medicine | Admitting: Emergency Medicine

## 2019-01-02 ENCOUNTER — Other Ambulatory Visit: Payer: Self-pay

## 2019-01-02 ENCOUNTER — Encounter (HOSPITAL_COMMUNITY): Payer: Self-pay | Admitting: Emergency Medicine

## 2019-01-02 DIAGNOSIS — D701 Agranulocytosis secondary to cancer chemotherapy: Secondary | ICD-10-CM

## 2019-01-02 DIAGNOSIS — R7989 Other specified abnormal findings of blood chemistry: Secondary | ICD-10-CM | POA: Insufficient documentation

## 2019-01-02 DIAGNOSIS — C7951 Secondary malignant neoplasm of bone: Secondary | ICD-10-CM | POA: Diagnosis not present

## 2019-01-02 DIAGNOSIS — R112 Nausea with vomiting, unspecified: Secondary | ICD-10-CM | POA: Diagnosis not present

## 2019-01-02 DIAGNOSIS — T451X5A Adverse effect of antineoplastic and immunosuppressive drugs, initial encounter: Secondary | ICD-10-CM | POA: Insufficient documentation

## 2019-01-02 DIAGNOSIS — Z79899 Other long term (current) drug therapy: Secondary | ICD-10-CM | POA: Diagnosis not present

## 2019-01-02 DIAGNOSIS — Z9011 Acquired absence of right breast and nipple: Secondary | ICD-10-CM | POA: Diagnosis not present

## 2019-01-02 DIAGNOSIS — R1013 Epigastric pain: Secondary | ICD-10-CM | POA: Insufficient documentation

## 2019-01-02 LAB — URINALYSIS, ROUTINE W REFLEX MICROSCOPIC
Bilirubin Urine: NEGATIVE
Glucose, UA: NEGATIVE mg/dL
Hgb urine dipstick: NEGATIVE
Ketones, ur: NEGATIVE mg/dL
Leukocytes,Ua: NEGATIVE
Nitrite: NEGATIVE
Protein, ur: NEGATIVE mg/dL
Specific Gravity, Urine: 1.015 (ref 1.005–1.030)
pH: 7 (ref 5.0–8.0)

## 2019-01-02 MED ORDER — PROCHLORPERAZINE EDISYLATE 10 MG/2ML IJ SOLN
10.0000 mg | Freq: Once | INTRAMUSCULAR | Status: AC
  Administered 2019-01-02: 10 mg via INTRAVENOUS
  Filled 2019-01-02: qty 2

## 2019-01-02 MED ORDER — SODIUM CHLORIDE 0.9% FLUSH: 3.0000 mL | Freq: Once | INTRAVENOUS | Status: DC

## 2019-01-02 NOTE — ED Notes (Signed)
ED Provider at bedside. 

## 2019-01-02 NOTE — ED Notes (Signed)
Spoke with the interpreter, stated she will get the first available interpreter to come here.

## 2019-01-02 NOTE — ED Notes (Signed)
Pt declines using the ASL on the ipad. ASL interpreter called for on call staff member. [phone]

## 2019-01-02 NOTE — ED Notes (Signed)
Interpreter will be here between 8:20~8:30 PM

## 2019-01-02 NOTE — ED Triage Notes (Addendum)
Patient reports persistent emesis for several days unrelieved by prescription medications , she is receiving chemotherapy ( last treatment last week) for breast/bone cancer , denies fever or chills . Patient initially refuse to use Ipad interpreter service but finally decided to use it due to long wait . Patient requested to collect blood specimen using her porta cath .

## 2019-01-02 NOTE — ED Notes (Signed)
Patient complaining that her port feels numb at this time.

## 2019-01-03 ENCOUNTER — Emergency Department (HOSPITAL_COMMUNITY): Payer: Medicaid Other

## 2019-01-03 DIAGNOSIS — R7989 Other specified abnormal findings of blood chemistry: Secondary | ICD-10-CM | POA: Diagnosis not present

## 2019-01-03 LAB — COMPREHENSIVE METABOLIC PANEL
ALT: 125 U/L — ABNORMAL HIGH (ref 0–44)
AST: 76 U/L — ABNORMAL HIGH (ref 15–41)
Albumin: 3.7 g/dL (ref 3.5–5.0)
Alkaline Phosphatase: 64 U/L (ref 38–126)
Anion gap: 12 (ref 5–15)
BUN: 6 mg/dL (ref 6–20)
CO2: 26 mmol/L (ref 22–32)
Calcium: 9.8 mg/dL (ref 8.9–10.3)
Chloride: 101 mmol/L (ref 98–111)
Creatinine, Ser: 0.8 mg/dL (ref 0.44–1.00)
GFR calc Af Amer: >60 mL/min (ref >60)
GFR calc non Af Amer: >60 mL/min (ref >60)
Glucose, Bld: 105 mg/dL — ABNORMAL HIGH (ref 70–99)
Potassium: 3.5 mmol/L (ref 3.5–5.1)
Sodium: 139 mmol/L (ref 135–145)
Total Bilirubin: 2.4 mg/dL — ABNORMAL HIGH (ref 0.3–1.2)
Total Protein: 6.1 g/dL — ABNORMAL LOW (ref 6.5–8.1)

## 2019-01-03 LAB — CBC
HCT: 30.4 % — ABNORMAL LOW (ref 36.0–46.0)
Hemoglobin: 10.3 g/dL — ABNORMAL LOW (ref 12.0–15.0)
MCH: 30.6 pg (ref 26.0–34.0)
MCHC: 33.9 g/dL (ref 30.0–36.0)
MCV: 90.2 fL (ref 80.0–100.0)
Platelets: 232 K/uL (ref 150–400)
RBC: 3.37 MIL/uL — ABNORMAL LOW (ref 3.87–5.11)
RDW: 13.4 % (ref 11.5–15.5)
WBC: 1.5 K/uL — ABNORMAL LOW (ref 4.0–10.5)
nRBC: 1.3 % — ABNORMAL HIGH (ref 0.0–0.2)

## 2019-01-03 LAB — LIPASE, BLOOD: Lipase: 17 U/L (ref 11–51)

## 2019-01-03 MED ORDER — PROCHLORPERAZINE MALEATE 10 MG PO TABS: 10.0000 mg | ORAL_TABLET | Freq: Two times a day (BID) | ORAL | 0 refills | Status: DC | PRN

## 2019-01-03 MED ORDER — HEPARIN SOD (PORK) LOCK FLUSH 100 UNIT/ML IV SOLN
500.0000 [IU] | Freq: Once | INTRAVENOUS | Status: AC
  Administered 2019-01-03: 500 [IU]
  Filled 2019-01-03: qty 5

## 2019-01-03 NOTE — ED Provider Notes (Signed)
[facility] EMERGENCY DEPARTMENT Provider Note   CSN: [id number] Arrival date & time: 01/02/19  1826     History Chief Complaint  Patient presents with  . Emesis    [name] is a 53 y.o. female.  Patient with h/o breast cancer (s/p mastectomy in remission), bone cancer (currently receiving chemotherapy), deaf, presents with c/o headache, nausea and vomiting x 5 days. She has a history of similar headaches associated with nausea. She has Zofran at home but it has not been effective. Tonight she started having blood tinged emesis and became more concerned. No chest pain, cough, SOB, congestion, fever, melena.   The history is provided by the patient. No language interpreter was used.  Emesis Associated symptoms: abdominal pain (Diffuse.), diarrhea ("I always have diarrhea". Unchanged) and headaches   Associated symptoms: no chills, no cough, no fever, no myalgias and no sore throat        Past Medical History:  Diagnosis Date  . Abnormal Pap smear   . Anxiety   . Breast cancer ([place])   . Cancer ([place]) 1999   breast-s/p mastectomy, chemo, rad  . CIN I (cervical intraepithelial neoplasia I) 2003   by colpo  . Congenital deafness   . Deaf   . Depression   . Port-A-Cath in place 2017   for chemotherapy  . Radiation 07/12/15-07/26/15   left femur 30 Gy  . S/P bilateral oophorectomy     Patient Active Problem List   Diagnosis Date Noted  . Chest pain 12/18/2017  . Atypical angina ([place]) 12/13/2017  . Abnormal stress echocardiogram 12/08/2017  . Bilateral thigh pain 11/19/2016  . Goals of care, counseling/discussion 03/05/2016  . MRSA (methicillin resistant Staphylococcus aureus) infection 01/18/2016  . Anemia due to antineoplastic chemotherapy 12/11/2015  . Lymphedema of left leg 12/11/2015  . Osteopenia of multiple sites 11/26/2015  . Chemotherapy-induced peripheral neuropathy ([place]) 11/03/2015  . Port catheter in place 09/26/2015  .  Chemotherapy induced nausea and vomiting 08/13/2015  . Cancer related pain 08/13/2015  . [name], right ([place]) 07/14/2015  . S/P TRAM (transverse rectus abdominis muscle) flap breast reconstruction 07/14/2015  . Thyroid nodule 07/09/2015  . Depression   . Generalized anxiety disorder   . Esophageal reflux   . Genetic testing 04/04/2015  . Congenital deafness 02/26/2015  . Premature surgical menopause 02/26/2015  . Benign paroxysmal positional vertigo 07/24/2014  . Migraine 07/24/2014  . DIZZINESS, CHRONIC 01/25/2007  . Insomnia 01/25/2007    Past Surgical History:  Procedure Laterality Date  . ABDOMINAL HYSTERECTOMY    . INTRAMEDULLARY (IM) NAIL INTERTROCHANTERIC Left 06/26/2015   Procedure: LEFT BIOMET LONG AFFIXS NAIL;  Surgeon: [name], MD;  Location: [facility];  Service: Orthopedics;  Laterality: Left;  . IR GENERIC HISTORICAL  08/06/2015   IR US GUIDE VASC ACCESS LEFT 08/06/2015 Aletta Edouard, MD [facility]  . IR GENERIC HISTORICAL  08/06/2015   IR FLUORO GUIDE CV LINE LEFT 08/06/2015 Aletta Edouard, MD [facility]  . IR GENERIC HISTORICAL  03/05/2016   IR CV LINE INJECTION 03/05/2016 Markus Daft, MD [facility]  . LEFT HEART CATH AND CORONARY ANGIOGRAPHY N/A 12/13/2017   Procedure: LEFT HEART CATH AND CORONARY ANGIOGRAPHY;  Surgeon: Leonie Man, MD;  Location: [facility] CV LAB;  Service: Cardiovascular;  Laterality: N/A;  . MASTECTOMY     right breast  . MASTECTOMY    . OVARIAN CYST REMOVAL    . RADIOLOGY WITH ANESTHESIA  Left 06/25/2015   Procedure: MRI OF LEFT HIP WITH OR WITHOUT CONTRAST;  Surgeon: Medication Radiologist, MD;  Location: [facility];  Service: Radiology;  Laterality: Left;  DR. MCINTYRE/MRI  . TUBAL LIGATION       OB History    Gravida  2   Para  2   Term  1   Preterm  1   AB  0   Living  2     SAB  0   TAB  0   Ectopic  0   Multiple  0   Live Births              Family History  Problem Relation  Age of Onset  . Hypertension Mother   . Seizures Mother   . Alzheimer's disease Maternal Uncle   . Pancreatitis Maternal Grandmother   . Heart attack Maternal Grandfather     Social History   Tobacco Use  . Smoking status: Never Smoker  . Smokeless tobacco: Never Used  Substance Use Topics  . Alcohol use: No  . Drug use: No    Home Medications Prior to Admission medications   Medication Sig Start Date End Date Taking? Authorizing Provider  albuterol (PROAIR HFA) 108 (90 Base) MCG/ACT inhaler Inhale 2 puffs into the lungs every 6 (six) hours as needed for wheezing or shortness of breath.    [provider]  albuterol (PROVENTIL HFA;VENTOLIN HFA) 108 (90 Base) MCG/ACT inhaler Inhale 2 puffs into the lungs every 6 (six) hours as needed for wheezing or shortness of breath. 02/11/18   Truitt Merle, MD  ALPRAZolam Duanne Moron) 0.25 MG tablet Take 1 tablet (0.25 mg total) by mouth 2 (two) times daily as needed for anxiety. 07/08/18   Truitt Merle, MD  diphenoxylate-atropine (LOMOTIL) 2.5-0.025 MG tablet Take 1-2 tablets by mouth 4 (four) times daily as needed for diarrhea or loose stools. 02/11/18   Truitt Merle, MD  ergocalciferol (VITAMIN D2) 1.25 MG (50000 UT) capsule Take 1 capsule (50,000 Units total) by mouth once a week. 11/09/18   Truitt Merle, MD  lidocaine-prilocaine (EMLA) cream Apply 1 application topically as needed. Apply to Porta-Cath 1-2 hours prior to access as directed. Patient taking differently: Apply 1 application topically as needed (To Porta-Cath 1-2 hours prior to access as directed).  08/26/18   Truitt Merle, MD  magnesium oxide (MAG-OX) 400 (241.3 Mg) MG tablet Take 1 tablet (400 mg total) by mouth daily. 10/21/18   Truitt Merle, MD  methocarbamol (ROBAXIN) 500 MG tablet Take 1 tablet (500 mg total) by mouth 2 (two) times daily. Patient taking differently: Take 500 mg by mouth 2 (two) times daily as needed for muscle spasms.  05/24/18   Joy, Shawn C, [name]  omeprazole (PRILOSEC) 20 MG  capsule Take 1 capsule (20 mg total) by mouth daily. 04/01/18   Truitt Merle, MD  ondansetron (ZOFRAN) 8 MG tablet Take 1 tablet (8 mg total) by mouth every 8 (eight) hours as needed for nausea or vomiting. 05/24/18   Alla Feeling, NP  oxyCODONE (ROXICODONE) 5 MG immediate release tablet Take 1 tablet (5 mg total) by mouth every 8 (eight) hours as needed for severe pain. 11/30/18   Truitt Merle, MD  potassium chloride (KLOR-CON) 10 MEQ tablet Take 3 tablets (30 mEq total) by mouth daily. 12/21/18   Truitt Merle, MD  prochlorperazine (COMPAZINE) 10 MG tablet Take 1 tablet (10 mg total) by mouth every 6 (six) hours as needed for nausea or vomiting. 02/25/18  Truitt Merle, MD  zolpidem (AMBIEN) 5 MG tablet Take 1-2 tablets (5-10 mg total) by mouth at bedtime as needed. 11/23/18   Truitt Merle, MD    Allergies    Promethazine hcl, Chlorhexidine, and Diphenhydramine hcl  Review of Systems   Review of Systems  Constitutional: Negative for chills and fever.  HENT: Negative.  Negative for congestion and sore throat.   Respiratory: Negative.  Negative for cough and shortness of breath.   Cardiovascular: Negative.   Gastrointestinal: Positive for abdominal pain (Diffuse.), diarrhea ("I always have diarrhea". Unchanged) and vomiting.  Musculoskeletal: Negative.  Negative for myalgias.  Skin: Negative.   Neurological: Positive for headaches. Negative for syncope and light-headedness.    Physical Exam Updated Vital Signs BP 118/71 (BP Location: Left Arm)   Pulse 83   Temp 98.6 F (37 C) (Oral)   Resp 16   SpO2 99%   Physical Exam Vitals and nursing note reviewed.  Constitutional:      General: She is not in acute distress.    Appearance: She is well-developed. She is not toxic-appearing.  HENT:     Head: Normocephalic.  Cardiovascular:     Rate and Rhythm: Normal rate and regular rhythm.  Pulmonary:     Effort: Pulmonary effort is normal.     Breath sounds: Normal breath sounds. No wheezing, rhonchi  or rales.     Comments: Porta-cath in left upper chest wall. Site unremarkable. Abdominal:     General: Bowel sounds are normal.     Palpations: Abdomen is soft.     Tenderness: There is abdominal tenderness (Diffuse). There is no guarding or rebound.  Musculoskeletal:        General: Normal range of motion.     Cervical back: Normal range of motion and neck supple.  Skin:    General: Skin is warm and dry.  Neurological:     Mental Status: She is alert and oriented to person, place, and time.     ED Results / Procedures / Treatments   Labs (all labs ordered are listed, but only abnormal results are displayed) Labs Reviewed  URINALYSIS, ROUTINE W REFLEX MICROSCOPIC  LIPASE, BLOOD  COMPREHENSIVE METABOLIC PANEL  CBC    EKG None  Radiology No results found.  Procedures Procedures (including critical care time)  Medications Ordered in ED Medications  sodium chloride flush (NS) 0.9 % injection 3 mL (3 mLs Intravenous Not Given 01/02/19 2319)  prochlorperazine (COMPAZINE) injection 10 mg (10 mg Intravenous Given 01/02/19 2352)    ED Course  I have reviewed the triage vital signs and the nursing notes.  Pertinent labs & imaging results that were available during my care of the patient were reviewed by me and considered in my medical decision making (see chart for details).    MDM Rules/Calculators/A&P                      Patient to ED with N, V HA x 5 days. On chemotherapy for bone CA. No fever. Mild hematemesis tonight. No melena.   She is overall well appearing. Plan IVF's, Compazine and re-evaluate.   Labs concerning for neutropenia of 1.5 (3.3 on 12/16); total bili of 2.4 (1.2 on 12/16); ALT 125 (41 on 12/16); AST 76 (26 on 12/16). Patient states she has not had any abdominal surgeries and still has her gall bladder. No tenderness specific to RUQ but she does have epigastric tenderness. [place] ordered for further evaluation.   Her  nausea is improved. Sipping PO  challenge without difficulty.   [place] negative, however does show prior cholecystectomy. Feel with history of CA, current nausea and vomiting, and new lab abnormalities CT scan is appropriate for further evaluation.   The patient is refusing other work up or testing. She states she feels much better and wants to go home to her bed. She looks better, more comfortable. She is eating applesauce without nausea. Will discharge home at patient's request but urge her to return with any recurrent or worsening symptoms. Also strongly encouraged close follow up with her doctor for further investigation into the new liver abnormalities.   Final Clinical Impression(s) / ED Diagnoses Final diagnoses:  None   1. Emesis 2. Elevated LFT's 3. Neutropenia  Rx / DC Orders ED Discharge Orders    None       Charlann Lange, [name] 01/03/19 [id number]    Maudie Flakes, MD 01/03/19 1659

## 2019-01-03 NOTE — Discharge Instructions (Signed)
Please follow up with your doctor for further evaluation of your elevated liver function tests tonight. You can take your Zofran at this point for nausea, or fill the prescription for the compazine, which is what you were given here.   You have chosen to leave without getting further recommended testing tonight. Please return anytime for further evaluation and treatment.

## 2019-01-09 NOTE — Progress Notes (Addendum)
[place]   [phone] [phone]   Clinic Follow up Note   Patient Care Team: [name], [name], [name] as PCP - General (Physician Assistant) [name], MD as Consulting Physician (Orthopedic Surgery)  Date of Service:  01/11/2019   I connected with [name] on 01/11/2019 by video enabled telemedicine visit and verified that I am speaking with the correct person using two identifiers.   I discussed the limitations, risks, security and privacy concerns of performing an evaluation and management service by telephone and the availability of in person appointments. I also discussed with the patient that there may be a patient responsible charge related to this service. The patient expressed understanding and agreed to proceed.   Other persons participating in the visit and their role in the encounter:  my scribe Amoya Bernnett    Patient's location: office  Provider's location:  Home  CHIEF COMPLAINT: F/u ofmetastatic breast cancer  SUMMARY OF ONCOLOGIC HISTORY: Oncology History Overview Note   Cancer Staging No matching staging information was found for the patient.     [name], right ([place])  11/1997 Cancer Diagnosis   Patient had 1.4 cm poorly differentiated right breast carcinoma diagnosed in Nov 1999 at age 67, 1/7 axillary nodes involved, ER/PR and HER 2 positive. She had mastectomy with the 7 axillary node evaluation, 4 cycles of adriamycin/ cytoxan followed by taxotere, then five years of tamoxifen thru June 2005 (no herceptin in 1999). She had bilateral oophorectomy in May 2005. She was briefly on aromatase inhibitor after tamoxifen, but discontinued this herself due to poor tolerance.   04/04/2015 Genetic Testing   Negative genetic testing on the breast/ovarian cancer pnael.  The Breast/Ovarian gene panel offered by [facility] includes sequencing and rearrangement analysis for the following 20  genes:  ATM, BARD1, BRCA1, BRCA2, BRIP1, CDH1, CHEK2, EPCAM, FANCC, MLH1, MSH2, MSH6, NBN, PALB2, PMS2, PTEN, RAD51C, RAD51D, TP53, and XRCC2.   06/26/2015 Pathology Results   Initial Path Report Bone, curettage, Left femur met, intramedullary subtroch tissue - METASTATIC ADENOCARCINOMA.  Estrogen Receptor: 95%, POSITIVE, STRONG STAINING INTENSITY Progesterone Receptor: 2%, POSITIVE, STRONG  HER2 (+), IHC 3+   06/26/2015 Surgery   Biopsy of metastatic tissue and stabilization with Affixus trochanteric nail, proximal and distal interlock of left femur by Dr. [name]    06/28/2015 Imaging   CT Chest w/ Contrast 1. Extensive right internal mammary chain lymphadenopathy consistent with metastatic breast cancer. 2. Lytic metastases involving the posterior elements at T1 with probable nondisplaced pathologic fracture of the spinous process. 3. No other evidence of thoracic metastatic disease. 4. Trace bilateral pleural effusions with associated bibasilar atelectasis. 5. Indeterminate right thyroid nodule, not previously imaged. This could be evaluated with thyroid ultrasound as clinically warranted.   07/01/2015 Imaging   Bone Scan 1. Increased activity noted throughout the left femur. Although metastatic disease cannot be excluded, these changes are most likely secondary to prior surgery. 2. No other focal abnormalities identified to suggest metastatic disease.   07/12/2015 - 07/26/2015 Radiation Therapy   Left femur, 30 Gy in 10 fractions by Dr. Sondra Come    07/14/2015 Initial Diagnosis   [name], right ([place])   07/19/2015 Imaging   Initial PET Scan 1. Severe multifocal osseous metastatic disease, much greater than anticipated based on prior imaging. 2. Multifocal nodal metastases involving the right internal mammary, prevascular and subpectoral lymph nodes. No axillary pulmonary involvement identified. 3. No distant extra  osseous metastases.   07/30/2015 -  11/29/2015 Chemotherapy   Docetaxel, Herceptin and Perjeta every 3 weeks X6 cycles, pt tolerated moderately well, restaging scan showed excellent response    09/18/2015 Imaging   CT Head w/wo Contrast 1. No acute intracranial abnormality or significant interval change. 2. Stable minimal periventricular white matter hypoattenuation on the right. This may reflect a remote ischemic injury. 3. No evidence for metastatic disease to the brain. 4. No focal soft tissue lesion to explain the patient's tenderness.   10/30/2015 Imaging   CT Abdomen Pelvis w/ Contrast 1. Few mildly prominent fluid-filled loops of small bowel scattered throughout the abdomen, with intraluminal fluid density within the distal colon. Given the provided history, findings are suggestive of acute enteritis/diarrheal illness. 2. No other acute intra-abdominal or pelvic process identified. 3. Widespread osseous metastatic disease, better evaluated on most recent PET-CT from 07/19/2015. No pathologic fracture or other complication. 4. 11 mm hypodensity within the left kidney. This lesion measures intermediate density, and is indeterminate. While this lesion is similar in size relative to recent studies, this is increased in size relative to prior study from 2012. Further evaluation with dedicated renal mass protocol CT and/or MRI is recommended for complete characterization.   12/2015 - 05/14/2016 Chemotherapy   Maintenance Herceptin and pejeta every 3 weeks stopped on 05/14/16 due to disease progression   12/25/2015 Imaging   Restaging PET Scan 1. Markedly improved skeletal activity, with only several faint foci of residual accentuated metabolic activity, but resolution of the vast majority of the previously extensive osseous metastatic disease. 2. Resolution of the prior right internal mammary and right prevascular lymph nodes. 3. Residual subcutaneous edema and edema along fascia planes in the left upper thigh. This remains  somewhat more than I would expect for placement of an IM nail 6 weeks ago. If there is leg swelling further down, consider Doppler venous ultrasound to rule out left lower extremity DVT. I do not perceive an obvious difference in density between the pelvic and common femoral veins on the noncontrast CT data. 4. Chronic right maxillary and right sphenoid sinusitis.   01/16/2016 - 10/20/2017 Anti-estrogen oral therapy   Letrozole 2.5 mg daily. She was switched to exemestane due to disease progression on 02/08/17. Stopped on 10/20/2017 when treatment changed to chemo    03/01/2016 Miscellaneous   Patient presented to ED following a fall; she reports she landed on her back and is now experiencing back pain. The patient was evaluated and discharge home same day with pain control.   05/01/2016 Imaging   CT CAP IMPRESSION: 1. Stable exam.  No new or progressive disease identified. 2. No evidence for residual or recurrent adenopathy within the chest. 3. Stable bone metastasis.   05/01/2016 Imaging   BONE SCAN IMPRESSION: 1. Small focal uptake involving the anterior rib ends of the left second and right fourth ribs, new since the prior bone scan. The location of this uptake is more suggestive of a traumatic or inflammatory etiology as opposed to metastatic disease. No other evidence of metastatic disease. 2. There are other areas of stable uptake which are most likely degenerative/reactive, including right shoulder and cervical spine uptake and uptake along the right femur adjacent to the ORIF hardware.   05/20/2016 Imaging   NM PET Skull to Thigh IMPRESSION: 1. There multiple metastatic foci in the bony pelvis, including some new abnormal foci compare to the prior PET-CT, compatible with mildly progressive bony metastatic disease. Also, a left T11 vertebral hypermetabolic metastatic  lesion is observed, new compared to the prior exam. 2. No active extraosseous malignancy is currently  identified. 3. Right anterior fourth rib healing fracture, appears be   06/04/2016 - 01/08/2017 Chemotherapy   Second line chemotherapy Kadcyla every 3 weeks started on 06/04/16 and stopped on 01/08/17 due to mixed response.     10/05/2016 Imaging   CT CAP and Bone Scan:  Bones: left intramedullary rod and left femoral head neck screw in place. In the proximal diaphysis of the left femur there is cortical thickening and irregularity. No lytic or blastic bone lesions. No fracture or vertebral endplate destruction.   No definite evidence of recurrent disease in the chest, abdomen, or pelvis.   10/28/2016 Imaging   CT A/P IMPRESSION: No acute process demonstrated in the abdomen or pelvis. No evidence of bowel obstruction or inflammation.   11/04/2016 Imaging   DG foot complete left: IMPRESSION: No fracture or dislocation.  No soft tissue abnormality   11/23/2016 Imaging   CT RIGHT FEMUR IMPRESSION: Negative CT scan of the right thigh.  No visible metastatic disease.  CT LEFT FEMUR IMPRESSION: 1. Postsurgical changes related to prior cephalomedullary rod fixation of the left femur with linear lucency along the anterolateral proximal femoral cortex at level of the lesser trochanter, suspicious for nondisplaced fracture. 2. Slightly permeative appearance of the proximal left femoral cortex at the site of known prior osseous metastases is largely unchanged. 3. Unchanged patchy lucency and sclerosis along the anterior acetabulum, which appears to correspond with an area of faint uptake on the prior PET-CT, suspicious for osseous metastasis. These results will be called to the ordering clinician or representative by the Radiologist Assistant, and communication documented in the PACS or zVision Dashboard.     01/27/2017 Imaging   IMPRESSION: 1. Mixed response to osseous metastasis. Some hypermetabolic lesions are new and increasingly hypermetabolic, while another index lesion is  decreasingly hypermetabolic. 2. No evidence of hypermetabolic soft tissue metastasis.    02/08/2017 - 10/19/2017 Antibody Plan   -Herceptin every 3 week since 02/08/17 -Oral Lanpantinib 1562m and decreased to 5040mdue to diarrhea and depression. Due to disease progression she swithced to Verzenio 15076mn 05/04/17. She did not toelrate well so she was switched to Ibrance 100m16m 05/31/17. Due to her neutropenia, Ibrance dose reduced to 75 mg daily, 3 weeks on, one-week off, stopped on 10/20/2017 due to disease progression.    04/30/2017 PET scan   IMPRESSION: 1. Primarily increase in hypermetabolism of osseous metastasis. An isolated right acetabular lesion is decreased in hypermetabolism since the prior exam. 2. No evidence of hypermetabolic soft tissue metastasis. 3. Similar non FDG avid right-sided thyroid nodule, favoring a benign etiology. Recommend attention on follow-up.   08/30/2017 PET scan   08/30/2017 PET Scan  IMPRESSION: 1. Worsening osseous metastatic disease. 2. Focal hypermetabolism in the central spinal canal the L1 level, similar to the prior exam. Metastatic implant cannot be excluded. 3. Slight enlargement of a right thyroid nodule which now appears more cystic in character. Consider further evaluation with thyroid ultrasound. If patient is clinically hyperthyroid, consider nuclear medicine thyroid uptake and scan   08/30/2017 Progression   08/30/2017 PET Scan  IMPRESSION: 1. Worsening osseous metastatic disease. 2. Focal hypermetabolism in the central spinal canal the L1 level, similar to the prior exam. Metastatic implant cannot be excluded. 3. Slight enlargement of a right thyroid nodule which now appears more cystic in character. Consider further evaluation with thyroid ultrasound. If patient is clinically hyperthyroid, consider  nuclear medicine thyroid uptake and scan   09/29/2017 - 10/11/2017 Radiation Therapy   Pt received 27 Gy in 9 fractions directed at the  areas of significant uptake noted on recent bone scan the right pelvis and right proximal femur, with Dr, Sondra Come    10/20/2017 - 04/07/2018 Chemotherapy   -Herceptin every 3 weeks starting 02/08/17, perjeta added on 10/20/2017  -weekly Taxol started on 10/21/2017. Will reduce Taxol to 2 weeks on, 1 week off starting on 02/04/17.  Stoped due to mild disease progression   10/27/2017 Imaging   10/27/2017 CT AP IMPRESSION: 1. No acute abnormality. No evidence of bowel obstruction or acute bowel inflammation. Normal appendix. 2. Stable patchy sclerotic osseous metastases throughout the visualized skeleton. No new or progressive metastatic disease in the abdomen or pelvis.   01/11/2018 Imaging   Whole Body Bone Scan 01/11/18  IMPRESSION: 1. Multifocal osseous metastases as described. 2. Right scapular metastasis. 3. Right superior thoracic spinous process. 4. Right sacral ala and right L5 metastases. 5. Right acetabular metastasis. 6. Right proximal femur metastasis. 7. Diffuse metastases throughout the left femur. 8. Anterior right fourth rib metastasis.   01/11/2018 Imaging   CT CAP W Contrast 01/11/18  IMPRESSION: 1. Similar-appearing osseous metastatic disease. 2. No evidence for progressed metastatic disease in the chest, abdomen or pelvis.   03/30/2018 Imaging   Bone Scan 03/30/18  IMPRESSION: Stable multifocal bony metastatic disease.   03/30/2018 Imaging    CT CAP W Contrast 03/30/18 IMPRESSION: 1. New mild contiguous wall thickening with associated mucosal hyperenhancement throughout the left colon and rectum, suggesting nonspecific infectious or inflammatory proctocolitis. Differential includes C diff colitis. 2. Stable faint patchy sclerosis in the iliac bones and lumbar vertebral bodies. No new or progressive osseous metastatic disease by CT. Please note that the osseous lesions are relatively occult by CT and would be better evaluated by PET-CT, as clinically  warranted. 3. No evidence of extra osseous metastatic disease.  These results will be called to the ordering clinician or representative by the Radiologist Assistant, and communication documented in the PACS or zVision Dashboard.   04/15/2018 - 06/16/2018 Chemotherapy   Enhertu q3weeks starting 04/15/18. Stopped Enhertu after cycle 3 on 05/27/18 due to poor toleration due to poor toleration with anorexia, weight loss, N&V, diarrhea   04/21/2018 PET scan   PET 04/21/18  IMPRESSION: 1. Overall marked interval improvement in bony hypermetabolic disease. All of the previously identified hypermetabolic osseous metastases have decreased in hypermetabolism in the interval. There is a new tiny focus of hypermetabolism in the right aspect of the L3 vertebral body that appears to correspond to a new 8 mm lucency, raising concern for new metastatic disease. Close attention on follow-up recommended. 2. Focal hypermetabolism in the central spinal canal at the level of L1 previously has resolved. 3. No hypermetabolic soft tissue disease on today's study. 4. Right thyroid nodule seen previously has decreased in the interval.    06/16/2018 Imaging   CT LUMBAR SPINE WO CONTRAST 06/16/18 IMPRESSION: 1. Transitional lumbosacral anatomy. Correlation with radiographs is recommended prior to any operative intervention. 2. Suspicion of a right L3 vertebral body metastasis on the April PET-CT is not evident by CT. Stable degenerative versus metastatic lesion at the right L5 superior endplate. Small but increased right iliac bone metastasis since March. Lumbar MRI would best characterize lumbar metastatic disease (without contrast if necessary). 3. Suspect L2-L3 left subarticular segment disc herniation. Query left L2 radiculitis. 4. Stable appearance of rightward L4-L5 disc degeneration which  could affect the right foramen and right lateral recess.   06/17/2018 - 11/16/2018 Chemotherapy   Restart Taxol 2 weeks  on/1 week off with Herceptin and Perjeta q3weeks on 06/17/18. Perjeta was stopped after C5 due to diarrhea. Stopped Taxol after 11/16/18 due to disease progression to L3.    07/06/2018 PET scan   PET  IMPRESSION: 1. Near total resolution of hypermetabolic activity associated with skeletal metastasis. Very mild activity associated with the LEFT iliac bone which is similar to background blood pool activity. 2. No new or progressive disease.   11/21/2018 PET scan   IMPRESSION: 1. New focal hypermetabolic lesion eccentric to the right in the L3 vertebral body, maximum SUV 6.1 compatible with a new focus of active malignancy. 2. Very low-grade activity similar to blood pool in previous existing mildly sclerotic metastatic lesions including the left iliac bone lesion. Some of the mildly sclerotic lesions are not hypermetabolic compatible with effectively treated bony metastatic disease. 3. No hypermetabolic soft tissue lesions are identified.     11/30/2018 -  Chemotherapy   Continue Herceptin q3weeks    12/12/2018 - 12/17/2018 Chemotherapy   Tucatinib (TUKYSA) 6 tabs BID as a next line therapy starting 12/12/18. Stopped 12/17/18 duet o N&V, poor appetite and HA    12/21/2018 -  Chemotherapy   IV Eribulin on day 1, 8 every 21 days starting 12/21/18      CURRENT THERAPY:  -Continue Herceptin q3weeks  -IV Eribulin on day 1, 8 every 21 days starting 12/21/18  INTERVAL HISTORY:  [name] is here for a follow up and treatment. She presents to the clinic with her interpretor. She did go to ED last week for N&V about 1 week after last dose C1 chemo. She notes Zofran at home did not work and she vomited mild blood. She was not given any IV at the ED and overall was an awful experience. She was given Zofran and they recommended a scan. She declined and only did [place]. She felt better only 1-2 days after ED visit.     REVIEW OF SYSTEMS:   Constitutional: Denies fevers, chills or abnormal  weight loss Eyes: Denies blurriness of vision Ears, nose, mouth, throat, and face: Denies mucositis or sore throat Respiratory: Denies cough, dyspnea or wheezes Cardiovascular: Denies palpitation, chest discomfort or lower extremity swelling Gastrointestinal:  Denies nausea, heartburn or change in bowel habits Skin: Denies abnormal skin rashes Lymphatics: Denies new lymphadenopathy or easy bruising Neurological:Denies numbness, tingling or new weaknesses Behavioral/Psych: Mood is stable, no new changes  All other systems were reviewed with the patient and are negative.  MEDICAL HISTORY:  Past Medical History:  Diagnosis Date   Abnormal Pap smear    Anxiety    Breast cancer ([place])    Cancer ([place]) 1999   breast-s/p mastectomy, chemo, rad   CIN I (cervical intraepithelial neoplasia I) 2003   by colpo   Congenital deafness    Deaf    Depression    Port-A-Cath in place 2017   for chemotherapy   Radiation 07/12/15-07/26/15   left femur 30 Gy   S/P bilateral oophorectomy     SURGICAL HISTORY: Past Surgical History:  Procedure Laterality Date   ABDOMINAL HYSTERECTOMY     INTRAMEDULLARY (IM) NAIL INTERTROCHANTERIC Left 06/26/2015   Procedure: LEFT BIOMET LONG AFFIXS NAIL;  Surgeon: [name], MD;  Location: [facility];  Service: Orthopedics;  Laterality: Left;   IR GENERIC HISTORICAL  08/06/2015   IR US GUIDE VASC ACCESS  LEFT 08/06/2015 Aletta Edouard, MD [facility]   IR GENERIC HISTORICAL  08/06/2015   IR FLUORO GUIDE CV LINE LEFT 08/06/2015 Aletta Edouard, MD [facility]   IR GENERIC HISTORICAL  03/05/2016   IR CV LINE INJECTION 03/05/2016 Markus Daft, MD [facility]   LEFT HEART CATH AND CORONARY ANGIOGRAPHY N/A 12/13/2017   Procedure: LEFT HEART CATH AND CORONARY ANGIOGRAPHY;  Surgeon: Leonie Man, MD;  Location: [facility];  Service: Cardiovascular;  Laterality: N/A;   MASTECTOMY     right breast   MASTECTOMY     OVARIAN CYST REMOVAL      RADIOLOGY WITH ANESTHESIA Left 06/25/2015   Procedure: MRI OF LEFT HIP WITH OR WITHOUT CONTRAST;  Surgeon: Medication Radiologist, MD;  Location: [facility];  Service: Radiology;  Laterality: Left;  DR. MCINTYRE/MRI   TUBAL LIGATION      I have reviewed the social history and family history with the patient and they are unchanged from previous note.  ALLERGIES:  is allergic to promethazine hcl; chlorhexidine; and diphenhydramine hcl.  MEDICATIONS:  Current Outpatient Medications  Medication Sig Dispense Refill   albuterol (PROAIR HFA) 108 (90 Base) MCG/ACT inhaler Inhale 2 puffs into the lungs every 6 (six) hours as needed for wheezing or shortness of breath.     albuterol (PROVENTIL HFA;VENTOLIN HFA) 108 (90 Base) MCG/ACT inhaler Inhale 2 puffs into the lungs every 6 (six) hours as needed for wheezing or shortness of breath. 1 Inhaler 0   ALPRAZolam (XANAX) 0.25 MG tablet Take 1 tablet (0.25 mg total) by mouth 2 (two) times daily as needed for anxiety. 30 tablet 0   diphenoxylate-atropine (LOMOTIL) 2.5-0.025 MG tablet Take 1-2 tablets by mouth 4 (four) times daily as needed for diarrhea or loose stools. 30 tablet 1   ergocalciferol (VITAMIN D2) 1.25 MG (50000 UT) capsule Take 1 capsule (50,000 Units total) by mouth once a week. 8 capsule 0   lidocaine-prilocaine (EMLA) cream Apply 1 application topically as needed. Apply to Porta-Cath 1-2 hours prior to access as directed. (Patient taking differently: Apply 1 application topically as needed (To Porta-Cath 1-2 hours prior to access as directed). ) 90 g 2   magnesium oxide (MAG-OX) 400 (241.3 Mg) MG tablet Take 1 tablet (400 mg total) by mouth daily. 30 tablet 3   methocarbamol (ROBAXIN) 500 MG tablet Take 1 tablet (500 mg total) by mouth 2 (two) times daily. (Patient taking differently: Take 500 mg by mouth 2 (two) times daily as needed for muscle spasms. ) 20 tablet 0   omeprazole (PRILOSEC) 20 MG capsule Take 1 capsule (20 mg total) by  mouth daily. 30 capsule 0   ondansetron (ZOFRAN) 8 MG tablet Take 1 tablet (8 mg total) by mouth every 8 (eight) hours as needed for nausea or vomiting. 45 tablet 1   oxyCODONE (ROXICODONE) 5 MG immediate release tablet Take 1 tablet (5 mg total) by mouth every 8 (eight) hours as needed for severe pain. 60 tablet 0   potassium chloride (KLOR-CON) 10 MEQ tablet Take 3 tablets (30 mEq total) by mouth daily. 90 tablet 0   prochlorperazine (COMPAZINE) 10 MG tablet Take 1 tablet (10 mg total) by mouth 2 (two) times daily as needed for nausea or vomiting. 10 tablet 0   zolpidem (AMBIEN) 5 MG tablet Take 1-2 tablets (5-10 mg total) by mouth at bedtime as needed. 60 tablet 0   No current facility-administered medications for this visit.   Facility-Administered Medications Ordered in Other Visits  Medication Dose Route Frequency Provider Last Rate Last Admin   loratadine (CLARITIN) tablet 10 mg  10 mg Oral Daily Truitt Merle, MD   10 mg at 01/11/19 1039   potassium chloride SA (K-DUR) CR tablet 40 mEq  40 mEq Oral Once Truitt Merle, MD       sodium chloride 0.9 % 1,000 mL with potassium chloride 10 mEq infusion   Intravenous Continuous Livesay, Tamala Julian, MD   Stopped at 09/10/15 1653   sodium chloride 0.9 % injection 10 mL  10 mL Intravenous PRN Livesay, Lennis P, MD       sodium chloride flush (NS) 0.9 % injection 10 mL  10 mL Intracatheter PRN Truitt Merle, MD   10 mL at 01/11/19 1205    PHYSICAL EXAMINATION: ECOG PERFORMANCE STATUS: 1 - Symptomatic but completely ambulatory  Vitals with BMI 01/11/2019  Height   Weight   BMI   Systolic 800  Diastolic 90  Pulse 79   Exam not performed today per virtual visit   LABORATORY DATA:  I have reviewed the data as listed CBC Latest Ref Rng & Units 01/11/2019 01/02/2019 12/28/2018  WBC 4.0 - 10.5 K/uL 2.9(L) 1.5(L) 3.3(L)  Hemoglobin 12.0 - 15.0 g/dL 10.7(L) 10.3(L) 10.1(L)  Hematocrit 36.0 - 46.0 % 32.2(L) 30.4(L) 29.4(L)  Platelets 150 - 400  K/uL 224 232 190     CMP Latest Ref Rng & Units 01/11/2019 01/02/2019 12/28/2018  Glucose 70 - 99 mg/dL 89 105(H) 90  BUN 6 - 20 mg/dL 9 6 5(L)  Creatinine 0.44 - 1.00 mg/dL 0.82 0.80 0.81  Sodium 135 - 145 mmol/L 142 139 142  Potassium 3.5 - 5.1 mmol/L 3.5 3.5 3.4(L)  Chloride 98 - 111 mmol/L 107 101 105  CO2 22 - 32 mmol/L 25 26 26   Calcium 8.9 - 10.3 mg/dL 8.6(L) 9.8 9.9  Total Protein 6.5 - 8.1 g/dL 6.3(L) 6.1(L) 6.2(L)  Total Bilirubin 0.3 - 1.2 mg/dL 1.3(H) 2.4(H) 1.2  Alkaline Phos 38 - 126 U/L 62 64 54  AST 15 - 41 U/L 29 76(H) 26  ALT 0 - 44 U/L 50(H) 125(H) 41      RADIOGRAPHIC STUDIES: I have personally reviewed the radiological images as listed and agreed with the findings in the report. No results found.   ASSESSMENT & PLAN:  [name] is a 53 y.o. 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 She had right mastectomy, 4 cycles of AC-T and 5 years of Tamoxifen/AI.  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 She has received Target RT. She had excellent response to First-line Docetaxel/Herceptin/Perjeta and AI but progressed on maintenance therapy. She had mixed response on second line Kadcyla, progressed on third-line Ibrance/Herceptin and poorly tolerated Enhertu.  -she tried Tucatinib (TUKYSA)with low dose on 12/12/18 however due to N&V, poor appetite and HA she stopped on 12/17/18 after trying twice. -I discussed other treatment options includes other oral HER2 antibody Neratinib or Fulvestrant injection which I recommend, or iv chemo. She declined any oral or injection agents, due to her fare of injection related pain, and her previous poor tolerance to other oral her2 antibodies. -I started her on next line  iv Eribulin on day 1, 8, every 21 days on 12/21/18, along with Herceptin every 3 weeks   -S/p Cycle 1 she  experienced later N&V with mild hematemesis. She  was seen at ED. She has recovered well.  This is likely a delayed nausea and vomiting from chemo. -Labs reviewed with mild leukopenia and neutropenia. Overall adequate to proceed with cycle 2 Eribulin and Herceptin. If she has anymore concerning side effects I recommend she contact my office. -I offered her Onpro patch on day 8 to improve her neutropenia starting next week. She agrees -We discussed nausea and vomiting management -F/u in 1 week   2. N&V  -Secondary to chemo  -I recommend she use Compazine or Zofran at least once daily, and as needed through the day, I encouraged her to use prophylactically to control nausea.  -I offered sublingual Zofran,she declined for now. -will add aloxi for next cycle chemo     3.Body pain, secondary to bone mets, Hypocalcemia, leg cramps  -Since her cancer diagnosis,she has had b/l hip, shoulder and chest painat different time points, likely related to her bone mets -She declined Gabapentin given risk of suicidal thoughts. -She is being treated with Zometa q41month. She does have hypocalcemia with leg cramps secondary to Zometa.  -She will continue oral calcium and Vit D. -She now developed mild low back pain, responding to the L3 new bone lesion, will continue pain medication.  4.Congenital deafness  5.Mild anemia, secondary to chemotherapy and malignancy -She will start with oral iron daily for 1-2 months and then stop. She will also start daily women's vitamin. -Stable mild anemia  6.Hypokalemia, Diarrhea -She developed diarrhea s/p cycle 3 Perjeta.Has been d/c -Istarted her onMag 4026mdaily and Oral potassium 1029m2-3 times a day. Given she does not take potassium TID consistently I encouraged her to increase potassium in her diet. She is agreeable.  7.Goal of care discussion -she understand her goal of care is palliative to prolong her life -she is full code for  now  8.Insomnia -She cannot take Benadryl due to allergy reaction. She has tried trazodone with no relief.  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 She declined using ice bags using infusion -stable   PLAN: -I refilled Ambien today which she takes once at night  -Labs reviewed and adequate to proceed with D1C2 Eribulin and Herceptin today  -Lab, flush, f/u and Eribulin in 1, 3, 4 weeks, Herceptin in 3 weeks  -f/u next week before chemo, will add Aloxi/dexa and onpro next week    No problem-specific Assessment & Plan notes found for this encounter.   No orders of the defined types were placed in this encounter.  All questions were answered. The patient knows to call the clinic with any problems, questions or concerns. No barriers to learning was detected. I spent 20 minutes counseling the patient face to face. The total time spent in the appointment was 25 minutes and more than 50% was on counseling and review of test results     YanTruitt MerleD 01/11/2019   I, AmoJoslyn Devonm acting as scribe for YanTruitt MerleD.   I have reviewed the above documentation for accuracy and completeness, and I agree with the above.

## 2019-01-11 ENCOUNTER — Inpatient Hospital Stay: Payer: Medicaid Other

## 2019-01-11 ENCOUNTER — Telehealth: Payer: Self-pay | Admitting: Hematology

## 2019-01-11 ENCOUNTER — Other Ambulatory Visit: Payer: Self-pay

## 2019-01-11 ENCOUNTER — Inpatient Hospital Stay (HOSPITAL_BASED_OUTPATIENT_CLINIC_OR_DEPARTMENT_OTHER): Payer: Medicaid Other | Admitting: Hematology

## 2019-01-11 ENCOUNTER — Encounter: Payer: Self-pay | Admitting: Hematology

## 2019-01-11 VITALS — BP 128/90 | HR 79 | Temp 98.0°F | Resp 16

## 2019-01-11 DIAGNOSIS — C7951 Secondary malignant neoplasm of bone: Secondary | ICD-10-CM

## 2019-01-11 DIAGNOSIS — C50911 Malignant neoplasm of unspecified site of right female breast: Secondary | ICD-10-CM | POA: Diagnosis not present

## 2019-01-11 DIAGNOSIS — G4700 Insomnia, unspecified: Secondary | ICD-10-CM | POA: Diagnosis not present

## 2019-01-11 DIAGNOSIS — Z5112 Encounter for antineoplastic immunotherapy: Secondary | ICD-10-CM | POA: Diagnosis not present

## 2019-01-11 DIAGNOSIS — Z95828 Presence of other vascular implants and grafts: Secondary | ICD-10-CM

## 2019-01-11 LAB — CMP (CANCER CENTER ONLY)
ALT: 50 U/L — ABNORMAL HIGH (ref 0–44)
AST: 29 U/L (ref 15–41)
Albumin: 3.6 g/dL (ref 3.5–5.0)
Alkaline Phosphatase: 62 U/L (ref 38–126)
Anion gap: 10 (ref 5–15)
BUN: 9 mg/dL (ref 6–20)
CO2: 25 mmol/L (ref 22–32)
Calcium: 8.6 mg/dL — ABNORMAL LOW (ref 8.9–10.3)
Chloride: 107 mmol/L (ref 98–111)
Creatinine: 0.82 mg/dL (ref 0.44–1.00)
GFR, Est AFR Am: >60 mL/min (ref >60)
GFR, Estimated: >60 mL/min (ref >60)
Glucose, Bld: 89 mg/dL (ref 70–99)
Potassium: 3.5 mmol/L (ref 3.5–5.1)
Sodium: 142 mmol/L (ref 135–145)
Total Bilirubin: 1.3 mg/dL — ABNORMAL HIGH (ref 0.3–1.2)
Total Protein: 6.3 g/dL — ABNORMAL LOW (ref 6.5–8.1)

## 2019-01-11 LAB — CBC WITH DIFFERENTIAL (CANCER CENTER ONLY)
Abs Immature Granulocytes: 0.1 10*3/uL — ABNORMAL HIGH (ref 0.00–0.07)
Basophils Absolute: 0 10*3/uL (ref 0.0–0.1)
Basophils Relative: 1 %
Eosinophils Absolute: 0 10*3/uL (ref 0.0–0.5)
Eosinophils Relative: 0 %
HCT: 32.2 % — ABNORMAL LOW (ref 36.0–46.0)
Hemoglobin: 10.7 g/dL — ABNORMAL LOW (ref 12.0–15.0)
Immature Granulocytes: 4 %
Lymphocytes Relative: 37 %
Lymphs Abs: 1.1 10*3/uL (ref 0.7–4.0)
MCH: 30.1 pg (ref 26.0–34.0)
MCHC: 33.2 g/dL (ref 30.0–36.0)
MCV: 90.4 fL (ref 80.0–100.0)
Monocytes Absolute: 0.5 10*3/uL (ref 0.1–1.0)
Monocytes Relative: 18 %
Neutro Abs: 1.2 10*3/uL — ABNORMAL LOW (ref 1.7–7.7)
Neutrophils Relative %: 40 %
Platelet Count: 224 10*3/uL (ref 150–400)
RBC: 3.56 MIL/uL — ABNORMAL LOW (ref 3.87–5.11)
RDW: 14.5 % (ref 11.5–15.5)
WBC Count: 2.9 10*3/uL — ABNORMAL LOW (ref 4.0–10.5)
nRBC: 0 % (ref 0.0–0.2)

## 2019-01-11 LAB — MAGNESIUM: Magnesium: 1.8 mg/dL (ref 1.7–2.4)

## 2019-01-11 MED ORDER — TRASTUZUMAB-DKST CHEMO 150 MG IV SOLR
6.0000 mg/kg | Freq: Once | INTRAVENOUS | Status: AC
  Administered 2019-01-11: 378 mg via INTRAVENOUS
  Filled 2019-01-11: qty 18

## 2019-01-11 MED ORDER — SODIUM CHLORIDE 0.9 % IV SOLN
Freq: Once | INTRAVENOUS | Status: AC
  Filled 2019-01-11: qty 250

## 2019-01-11 MED ORDER — PROCHLORPERAZINE MALEATE 10 MG PO TABS
ORAL_TABLET | ORAL | Status: AC
  Filled 2019-01-11: qty 1

## 2019-01-11 MED ORDER — HEPARIN SOD (PORK) LOCK FLUSH 100 UNIT/ML IV SOLN
500.0000 [IU] | Freq: Once | INTRAVENOUS | Status: AC | PRN
  Administered 2019-01-11: 500 [IU]
  Filled 2019-01-11: qty 5

## 2019-01-11 MED ORDER — LORATADINE 10 MG PO TABS
10.0000 mg | ORAL_TABLET | Freq: Every day | ORAL | Status: DC
  Administered 2019-01-11: 10 mg via ORAL

## 2019-01-11 MED ORDER — ACETAMINOPHEN 325 MG PO TABS
ORAL_TABLET | ORAL | Status: AC
  Filled 2019-01-11: qty 2

## 2019-01-11 MED ORDER — SODIUM CHLORIDE 0.9% FLUSH
10.0000 mL | INTRAVENOUS | Status: DC | PRN
  Administered 2019-01-11: 10 mL
  Filled 2019-01-11: qty 10

## 2019-01-11 MED ORDER — PROCHLORPERAZINE MALEATE 10 MG PO TABS
10.0000 mg | ORAL_TABLET | Freq: Once | ORAL | Status: AC
  Administered 2019-01-11: 10 mg via ORAL

## 2019-01-11 MED ORDER — ZOLPIDEM TARTRATE 5 MG PO TABS: 5.0000 mg | ORAL_TABLET | Freq: Every evening | ORAL | 0 refills | Status: DC | PRN

## 2019-01-11 MED ORDER — LORATADINE 10 MG PO TABS
ORAL_TABLET | ORAL | Status: AC
  Filled 2019-01-11: qty 1

## 2019-01-11 MED ORDER — ACETAMINOPHEN 325 MG PO TABS
650.0000 mg | ORAL_TABLET | Freq: Once | ORAL | Status: AC
  Administered 2019-01-11: 650 mg via ORAL

## 2019-01-11 MED ORDER — SODIUM CHLORIDE 0.9 % IV SOLN
1.4000 mg/m2 | Freq: Once | INTRAVENOUS | Status: AC
  Administered 2019-01-11: 2.4 mg via INTRAVENOUS
  Filled 2019-01-11: qty 4.8

## 2019-01-11 MED ORDER — SODIUM CHLORIDE 0.9% FLUSH
10.0000 mL | Freq: Once | INTRAVENOUS | Status: AC
  Administered 2019-01-11: 10 mL
  Filled 2019-01-11: qty 10

## 2019-01-11 NOTE — Telephone Encounter (Signed)
Scheduled per 12/30 los, patient received printed calender.

## 2019-01-11 NOTE — Patient Instructions (Signed)

## 2019-01-11 NOTE — Patient Instructions (Signed)
[facility] Discharge Instructions for Patients Receiving Chemotherapy  Today you received the following chemotherapy agents: trastuzumab and eribulin.  To help prevent nausea and vomiting after your treatment, we encourage you to take your nausea medication as directed.   If you develop nausea and vomiting that is not controlled by your nausea medication, call the clinic.   BELOW ARE SYMPTOMS THAT SHOULD BE REPORTED IMMEDIATELY:  *FEVER GREATER THAN 100.5 F  *CHILLS WITH OR WITHOUT FEVER  NAUSEA AND VOMITING THAT IS NOT CONTROLLED WITH YOUR NAUSEA MEDICATION  *UNUSUAL SHORTNESS OF BREATH  *UNUSUAL BRUISING OR BLEEDING  TENDERNESS IN MOUTH AND THROAT WITH OR WITHOUT PRESENCE OF ULCERS  *URINARY PROBLEMS  *BOWEL PROBLEMS  UNUSUAL RASH Items with * indicate a potential emergency and should be followed up as soon as possible.  Feel free to call the clinic should you have any questions or concerns. The clinic phone number is [phone].  Please show the [facility] at check-in to the Emergency Department and triage nurse.

## 2019-01-11 NOTE — Progress Notes (Signed)
Per Dr. Feng, ok to treat with ANC 1.2 

## 2019-01-18 NOTE — Progress Notes (Signed)
[place]   [phone] [phone]   Clinic Follow up Note   Patient Care Team: [name], [name], [name] as PCP - General (Physician Assistant) [name], MD as Consulting Physician (Orthopedic Surgery)  Date of Service:  01/19/2019  CHIEF COMPLAINT: F/u ofmetastatic breast cancer  SUMMARY OF ONCOLOGIC HISTORY: Oncology History Overview Note   Cancer Staging No matching staging information was found for the patient.     [name], right ([place])  11/1997 Cancer Diagnosis   Patient had 1.4 cm poorly differentiated right breast carcinoma diagnosed in Nov 1999 at age 34, 1/7 axillary nodes involved, ER/PR and HER 2 positive. She had mastectomy with the 7 axillary node evaluation, 4 cycles of adriamycin/ cytoxan followed by taxotere, then five years of tamoxifen thru June 2005 (no herceptin in 1999). She had bilateral oophorectomy in May 2005. She was briefly on aromatase inhibitor after tamoxifen, but discontinued this herself due to poor tolerance.   04/04/2015 Genetic Testing   Negative genetic testing on the breast/ovarian cancer pnael.  The Breast/Ovarian gene panel offered by [facility] includes sequencing and rearrangement analysis for the following 20 genes:  ATM, BARD1, BRCA1, BRCA2, BRIP1, CDH1, CHEK2, EPCAM, FANCC, MLH1, MSH2, MSH6, NBN, PALB2, PMS2, PTEN, RAD51C, RAD51D, TP53, and XRCC2.   06/26/2015 Pathology Results   Initial Path Report Bone, curettage, Left femur met, intramedullary subtroch tissue - METASTATIC ADENOCARCINOMA.  Estrogen Receptor: 95%, POSITIVE, STRONG STAINING INTENSITY Progesterone Receptor: 2%, POSITIVE, STRONG  HER2 (+), IHC 3+   06/26/2015 Surgery   Biopsy of metastatic tissue and stabilization with Affixus trochanteric nail, proximal and distal interlock of left femur by Dr. [name]    06/28/2015 Imaging   CT Chest w/ Contrast 1. Extensive right internal mammary chain  lymphadenopathy consistent with metastatic breast cancer. 2. Lytic metastases involving the posterior elements at T1 with probable nondisplaced pathologic fracture of the spinous process. 3. No other evidence of thoracic metastatic disease. 4. Trace bilateral pleural effusions with associated bibasilar atelectasis. 5. Indeterminate right thyroid nodule, not previously imaged. This could be evaluated with thyroid ultrasound as clinically warranted.   07/01/2015 Imaging   Bone Scan 1. Increased activity noted throughout the left femur. Although metastatic disease cannot be excluded, these changes are most likely secondary to prior surgery. 2. No other focal abnormalities identified to suggest metastatic disease.   07/12/2015 - 07/26/2015 Radiation Therapy   Left femur, 30 Gy in 10 fractions by Dr. Sondra Come    07/14/2015 Initial Diagnosis   [name], right ([place])   07/19/2015 Imaging   Initial PET Scan 1. Severe multifocal osseous metastatic disease, much greater than anticipated based on prior imaging. 2. Multifocal nodal metastases involving the right internal mammary, prevascular and subpectoral lymph nodes. No axillary pulmonary involvement identified. 3. No distant extra osseous metastases.   07/30/2015 - 11/29/2015 Chemotherapy   Docetaxel, Herceptin and Perjeta every 3 weeks X6 cycles, pt tolerated moderately well, restaging scan showed excellent response    09/18/2015 Imaging   CT Head w/wo Contrast 1. No acute intracranial abnormality or significant interval change. 2. Stable minimal periventricular white matter hypoattenuation on the right. This may reflect a remote ischemic injury. 3. No evidence for metastatic disease to the brain. 4. No focal soft tissue lesion to explain the patient's tenderness.   10/30/2015 Imaging   CT Abdomen Pelvis w/ Contrast 1. Few mildly prominent fluid-filled loops of small bowel scattered throughout the abdomen, with  intraluminal fluid density within the distal colon. Given the provided history, findings are suggestive of acute enteritis/diarrheal illness. 2. No other acute intra-abdominal or pelvic process identified. 3. Widespread osseous metastatic disease, better evaluated on most recent PET-CT from 07/19/2015. No pathologic fracture or other complication. 4. 11 mm hypodensity within the left kidney. This lesion measures intermediate density, and is indeterminate. While this lesion is similar in size relative to recent studies, this is increased in size relative to prior study from 2012. Further evaluation with dedicated renal mass protocol CT and/or MRI is recommended for complete characterization.   12/2015 - 05/14/2016 Chemotherapy   Maintenance Herceptin and pejeta every 3 weeks stopped on 05/14/16 due to disease progression   12/25/2015 Imaging   Restaging PET Scan 1. Markedly improved skeletal activity, with only several faint foci of residual accentuated metabolic activity, but resolution of the vast majority of the previously extensive osseous metastatic disease. 2. Resolution of the prior right internal mammary and right prevascular lymph nodes. 3. Residual subcutaneous edema and edema along fascia planes in the left upper thigh. This remains somewhat more than I would expect for placement of an IM nail 6 weeks ago. If there is leg swelling further down, consider Doppler venous ultrasound to rule out left lower extremity DVT. I do not perceive an obvious difference in density between the pelvic and common femoral veins on the noncontrast CT data. 4. Chronic right maxillary and right sphenoid sinusitis.   01/16/2016 - 10/20/2017 Anti-estrogen oral therapy   Letrozole 2.5 mg daily. She was switched to exemestane due to disease progression on 02/08/17. Stopped on 10/20/2017 when treatment changed to chemo    03/01/2016 Miscellaneous   Patient presented to ED following a fall; she reports she landed on her  back and is now experiencing back pain. The patient was evaluated and discharge home same day with pain control.   05/01/2016 Imaging   CT CAP IMPRESSION: 1. Stable exam.  No new or progressive disease identified. 2. No evidence for residual or recurrent adenopathy within the chest. 3. Stable bone metastasis.   05/01/2016 Imaging   BONE SCAN IMPRESSION: 1. Small focal uptake involving the anterior rib ends of the left second and right fourth ribs, new since the prior bone scan. The location of this uptake is more suggestive of a traumatic or inflammatory etiology as opposed to metastatic disease. No other evidence of metastatic disease. 2. There are other areas of stable uptake which are most likely degenerative/reactive, including right shoulder and cervical spine uptake and uptake along the right femur adjacent to the ORIF hardware.   05/20/2016 Imaging   NM PET Skull to Thigh IMPRESSION: 1. There multiple metastatic foci in the bony pelvis, including some new abnormal foci compare to the prior PET-CT, compatible with mildly progressive bony metastatic disease. Also, a left T11 vertebral hypermetabolic metastatic lesion is observed, new compared to the prior exam. 2. No active extraosseous malignancy is currently identified. 3. Right anterior fourth rib healing fracture, appears be   06/04/2016 - 01/08/2017 Chemotherapy   Second line chemotherapy Kadcyla every 3 weeks started on 06/04/16 and stopped on 01/08/17 due to mixed response.     10/05/2016 Imaging   CT CAP and Bone Scan:  Bones: left intramedullary rod and left femoral head neck screw in place. In the proximal diaphysis of the left femur there is cortical thickening and irregularity. No lytic or blastic bone lesions. No fracture or vertebral endplate destruction.   No definite evidence of recurrent  disease in the chest, abdomen, or pelvis.   10/28/2016 Imaging   CT A/P IMPRESSION: No acute process demonstrated in  the abdomen or pelvis. No evidence of bowel obstruction or inflammation.   11/04/2016 Imaging   DG foot complete left: IMPRESSION: No fracture or dislocation.  No soft tissue abnormality   11/23/2016 Imaging   CT RIGHT FEMUR IMPRESSION: Negative CT scan of the right thigh.  No visible metastatic disease.  CT LEFT FEMUR IMPRESSION: 1. Postsurgical changes related to prior cephalomedullary rod fixation of the left femur with linear lucency along the anterolateral proximal femoral cortex at level of the lesser trochanter, suspicious for nondisplaced fracture. 2. Slightly permeative appearance of the proximal left femoral cortex at the site of known prior osseous metastases is largely unchanged. 3. Unchanged patchy lucency and sclerosis along the anterior acetabulum, which appears to correspond with an area of faint uptake on the prior PET-CT, suspicious for osseous metastasis. These results will be called to the ordering clinician or representative by the Radiologist Assistant, and communication documented in the PACS or zVision Dashboard.     01/27/2017 Imaging   IMPRESSION: 1. Mixed response to osseous metastasis. Some hypermetabolic lesions are new and increasingly hypermetabolic, while another index lesion is decreasingly hypermetabolic. 2. No evidence of hypermetabolic soft tissue metastasis.    02/08/2017 - 10/19/2017 Antibody Plan   -Herceptin every 3 week since 02/08/17 -Oral Lanpantinib 1548m and decreased to 5027mdue to diarrhea and depression. Due to disease progression she swithced to Verzenio 15032mn 05/04/17. She did not toelrate well so she was switched to Ibrance 100m35m 05/31/17. Due to her neutropenia, Ibrance dose reduced to 75 mg daily, 3 weeks on, one-week off, stopped on 10/20/2017 due to disease progression.    04/30/2017 PET scan   IMPRESSION: 1. Primarily increase in hypermetabolism of osseous metastasis. An isolated right acetabular lesion is  decreased in hypermetabolism since the prior exam. 2. No evidence of hypermetabolic soft tissue metastasis. 3. Similar non FDG avid right-sided thyroid nodule, favoring a benign etiology. Recommend attention on follow-up.   08/30/2017 PET scan   08/30/2017 PET Scan  IMPRESSION: 1. Worsening osseous metastatic disease. 2. Focal hypermetabolism in the central spinal canal the L1 level, similar to the prior exam. Metastatic implant cannot be excluded. 3. Slight enlargement of a right thyroid nodule which now appears more cystic in character. Consider further evaluation with thyroid ultrasound. If patient is clinically hyperthyroid, consider nuclear medicine thyroid uptake and scan   08/30/2017 Progression   08/30/2017 PET Scan  IMPRESSION: 1. Worsening osseous metastatic disease. 2. Focal hypermetabolism in the central spinal canal the L1 level, similar to the prior exam. Metastatic implant cannot be excluded. 3. Slight enlargement of a right thyroid nodule which now appears more cystic in character. Consider further evaluation with thyroid ultrasound. If patient is clinically hyperthyroid, consider nuclear medicine thyroid uptake and scan   09/29/2017 - 10/11/2017 Radiation Therapy   Pt received 27 Gy in 9 fractions directed at the areas of significant uptake noted on recent bone scan the right pelvis and right proximal femur, with Dr, KinaSondra Come10/09/2017 - 04/07/2018 Chemotherapy   -Herceptin every 3 weeks starting 02/08/17, perjeta added on 10/20/2017  -weekly Taxol started on 10/21/2017. Will reduce Taxol to 2 weeks on, 1 week off starting on 02/04/17.  Stoped due to mild disease progression   10/27/2017 Imaging   10/27/2017 CT AP IMPRESSION: 1. No acute abnormality. No evidence of bowel obstruction or acute bowel  inflammation. Normal appendix. 2. Stable patchy sclerotic osseous metastases throughout the visualized skeleton. No new or progressive metastatic disease in the abdomen  or pelvis.   01/11/2018 Imaging   Whole Body Bone Scan 01/11/18  IMPRESSION: 1. Multifocal osseous metastases as described. 2. Right scapular metastasis. 3. Right superior thoracic spinous process. 4. Right sacral ala and right L5 metastases. 5. Right acetabular metastasis. 6. Right proximal femur metastasis. 7. Diffuse metastases throughout the left femur. 8. Anterior right fourth rib metastasis.   01/11/2018 Imaging   CT CAP W Contrast 01/11/18  IMPRESSION: 1. Similar-appearing osseous metastatic disease. 2. No evidence for progressed metastatic disease in the chest, abdomen or pelvis.   03/30/2018 Imaging   Bone Scan 03/30/18  IMPRESSION: Stable multifocal bony metastatic disease.   03/30/2018 Imaging    CT CAP W Contrast 03/30/18 IMPRESSION: 1. New mild contiguous wall thickening with associated mucosal hyperenhancement throughout the left colon and rectum, suggesting nonspecific infectious or inflammatory proctocolitis. Differential includes C diff colitis. 2. Stable faint patchy sclerosis in the iliac bones and lumbar vertebral bodies. No new or progressive osseous metastatic disease by CT. Please note that the osseous lesions are relatively occult by CT and would be better evaluated by PET-CT, as clinically warranted. 3. No evidence of extra osseous metastatic disease.  These results will be called to the ordering clinician or representative by the Radiologist Assistant, and communication documented in the PACS or zVision Dashboard.   04/15/2018 - 06/16/2018 Chemotherapy   Enhertu q3weeks starting 04/15/18. Stopped Enhertu after cycle 3 on 05/27/18 due to poor toleration due to poor toleration with anorexia, weight loss, N&V, diarrhea   04/21/2018 PET scan   PET 04/21/18  IMPRESSION: 1. Overall marked interval improvement in bony hypermetabolic disease. All of the previously identified hypermetabolic osseous metastases have decreased in hypermetabolism in the interval.  There is a new tiny focus of hypermetabolism in the right aspect of the L3 vertebral body that appears to correspond to a new 8 mm lucency, raising concern for new metastatic disease. Close attention on follow-up recommended. 2. Focal hypermetabolism in the central spinal canal at the level of L1 previously has resolved. 3. No hypermetabolic soft tissue disease on today's study. 4. Right thyroid nodule seen previously has decreased in the interval.    06/16/2018 Imaging   CT LUMBAR SPINE WO CONTRAST 06/16/18 IMPRESSION: 1. Transitional lumbosacral anatomy. Correlation with radiographs is recommended prior to any operative intervention. 2. Suspicion of a right L3 vertebral body metastasis on the April PET-CT is not evident by CT. Stable degenerative versus metastatic lesion at the right L5 superior endplate. Small but increased right iliac bone metastasis since March. Lumbar MRI would best characterize lumbar metastatic disease (without contrast if necessary). 3. Suspect L2-L3 left subarticular segment disc herniation. Query left L2 radiculitis. 4. Stable appearance of rightward L4-L5 disc degeneration which could affect the right foramen and right lateral recess.   06/17/2018 - 11/16/2018 Chemotherapy   Restart Taxol 2 weeks on/1 week off with Herceptin and Perjeta q3weeks on 06/17/18. Perjeta was stopped after C5 due to diarrhea. Stopped Taxol after 11/16/18 due to disease progression to L3.    07/06/2018 PET scan   PET  IMPRESSION: 1. Near total resolution of hypermetabolic activity associated with skeletal metastasis. Very mild activity associated with the LEFT iliac bone which is similar to background blood pool activity. 2. No new or progressive disease.   11/21/2018 PET scan   IMPRESSION: 1. New focal hypermetabolic lesion eccentric to the right  in the L3 vertebral body, maximum SUV 6.1 compatible with a new focus of active malignancy. 2. Very low-grade activity similar to  blood pool in previous existing mildly sclerotic metastatic lesions including the left iliac bone lesion. Some of the mildly sclerotic lesions are not hypermetabolic compatible with effectively treated bony metastatic disease. 3. No hypermetabolic soft tissue lesions are identified.     11/30/2018 -  Chemotherapy   Continue Herceptin q3weeks    12/12/2018 - 12/17/2018 Chemotherapy   Tucatinib (TUKYSA) 6 tabs BID as a next line therapy starting 12/12/18. Stopped 12/17/18 duet o N&V, poor appetite and HA    12/21/2018 -  Chemotherapy   IV Eribulin on day 1, 8 every 21 days starting 12/21/18      CURRENT THERAPY:  -Continue Herceptin q3weeks -IV Eribulin on day 1, 8 every 21 days starting 12/21/18. Onpro patch added C2D8.   INTERVAL HISTORY:  [name] is here for a follow up and treatment. She presents to the clinic with her interpretor.  She notes her hair is falling out again and does not want to keep losing her hair. She also notes her left upper leg soreness can radiate to her right leg occasionally. She notes numbness of her left leg yesterday. She notes in 1 day she will take 5-16m oxycodone 1-2 times a day.  She notes recent episodes of vomiting and HA's along with mild diarrhea. She has been able to maintain weight. She notes she woke up early this morning and hard to go back to sleep and feels her sleep medication did not work for her.   REVIEW OF SYSTEMS:   Constitutional: Denies fevers, chills or abnormal weight loss (+) HA's Eyes: Denies blurriness of vision Ears, nose, mouth, throat, and face: Denies mucositis or sore throat Respiratory: Denies cough, dyspnea or wheezes Cardiovascular: Denies palpitation, chest discomfort or lower extremity swelling Gastrointestinal:  Denies, heartburn (+) Mild diarrhea (+) Occasional N&V Skin: Denies abnormal skin rashes MSK: (+) upper left leg pain radiating to right leg Lymphatics: Denies new lymphadenopathy or easy  bruising Neurological:Denies numbness, tingling or new weaknesses Behavioral/Psych: Mood is stable, no new changes  All other systems were reviewed with the patient and are negative.  MEDICAL HISTORY:  Past Medical History:  Diagnosis Date  . Abnormal Pap smear   . Anxiety   . [place]   [place]   breast-s/p mastectomy, chemo, rad  . CIN I (cervical intraepithelial neoplasia I) 2003   by colpo  . Congenital deafness   . Deaf   . Depression   . Port-A-Cath in place 2017   for chemotherapy  . Radiation 07/12/15-07/26/15   left femur 30 Gy  . S/P bilateral oophorectomy     SURGICAL HISTORY: Past Surgical History:  Procedure Laterality Date  . ABDOMINAL HYSTERECTOMY    . INTRAMEDULLARY (IM) NAIL INTERTROCHANTERIC Left 06/26/2015   Procedure: LEFT BIOMET LONG AFFIXS NAIL;  Surgeon: MMarybelle Killings MD;  [place] [facility]  Service: Orthopedics;  Laterality: Left;  . IR GENERIC HISTORICAL  08/06/2015   IR [place] LEFT 08/06/2015 GAletta Edouard MD [facility]  . IR GENERIC HISTORICAL  08/06/2015   IR FLUORO GUIDE CV LINE LEFT 08/06/2015 GAletta Edouard MD [facility]  . IR GENERIC HISTORICAL  03/05/2016   IR CV LINE INJECTION 03/05/2016 AMarkus Daft MD [facility]  . LEFT HEART CATH AND CORONARY ANGIOGRAPHY N/A 12/13/2017   Procedure: LEFT HEART CATH AND CORONARY ANGIOGRAPHY;  Surgeon: Leonie Man, MD;  Location: [facility];  Service: Cardiovascular;  Laterality: N/A;  . MASTECTOMY     right breast  . MASTECTOMY    . OVARIAN CYST REMOVAL    . RADIOLOGY WITH ANESTHESIA Left 06/25/2015   Procedure: MRI OF LEFT HIP WITH OR WITHOUT CONTRAST;  Surgeon: Medication Radiologist, MD;  Location: [facility];  Service: Radiology;  Laterality: Left;  DR. MCINTYRE/MRI  . TUBAL LIGATION      I have reviewed the social history and family history with the patient and they are unchanged from previous note.  ALLERGIES:  is allergic to promethazine hcl;  chlorhexidine; and diphenhydramine hcl.  MEDICATIONS:  Current Outpatient Medications  Medication Sig Dispense Refill  . albuterol (PROAIR HFA) 108 (90 Base) MCG/ACT inhaler Inhale 2 puffs into the lungs every 6 (six) hours as needed for wheezing or shortness of breath.    Marland Kitchen albuterol (PROVENTIL HFA;VENTOLIN HFA) 108 (90 Base) MCG/ACT inhaler Inhale 2 puffs into the lungs every 6 (six) hours as needed for wheezing or shortness of breath. 1 Inhaler 0  . ALPRAZolam (XANAX) 0.25 MG tablet Take 1 tablet (0.25 mg total) by mouth 2 (two) times daily as needed for anxiety. 30 tablet 0  . diphenoxylate-atropine (LOMOTIL) 2.5-0.025 MG tablet Take 1-2 tablets by mouth 4 (four) times daily as needed for diarrhea or loose stools. 30 tablet 1  . ergocalciferol (VITAMIN D2) 1.25 MG (50000 UT) capsule Take 1 capsule (50,000 Units total) by mouth once a week. 8 capsule 0  . lidocaine-prilocaine (EMLA) cream Apply 1 application topically as needed. Apply to Porta-Cath 1-2 hours prior to access as directed. (Patient taking differently: Apply 1 application topically as needed (To Porta-Cath 1-2 hours prior to access as directed). ) 90 g 2  . magnesium oxide (MAG-OX) 400 (241.3 Mg) MG tablet Take 1 tablet (400 mg total) by mouth daily. 30 tablet 3  . methocarbamol (ROBAXIN) 500 MG tablet Take 1 tablet (500 mg total) by mouth 2 (two) times daily. (Patient taking differently: Take 500 mg by mouth 2 (two) times daily as needed for muscle spasms. ) 20 tablet 0  . omeprazole (PRILOSEC) 20 MG capsule Take 1 capsule (20 mg total) by mouth daily. 30 capsule 3  . ondansetron (ZOFRAN) 8 MG tablet Take 1 tablet (8 mg total) by mouth every 8 (eight) hours as needed for nausea or vomiting. 45 tablet 1  . oxyCODONE (ROXICODONE) 5 MG immediate release tablet Take 1-2 tablets (5-10 mg total) by mouth every 8 (eight) hours as needed for severe pain. 60 tablet 0  . potassium chloride (KLOR-CON) 10 MEQ tablet Take 3 tablets (30 mEq total)  by mouth daily. 90 tablet 0  . prochlorperazine (COMPAZINE) 10 MG tablet Take 1 tablet (10 mg total) by mouth 2 (two) times daily as needed for nausea or vomiting. 10 tablet 0  . zolpidem (AMBIEN) 5 MG tablet Take 1-2 tablets (5-10 mg total) by mouth at bedtime as needed. 60 tablet 0   No current facility-administered medications for this visit.   Facility-Administered Medications Ordered in Other Visits  Medication Dose Route Frequency Provider Last Rate Last Admin  . potassium chloride SA (K-DUR) CR tablet 40 mEq  40 mEq Oral Once Truitt Merle, MD      . sodium chloride 0.9 % 1,000 mL with potassium chloride 10 mEq infusion   Intravenous Continuous Gordy Levan, MD   Stopped at 09/10/15 1653  . sodium chloride 0.9 % injection  10 mL  10 mL Intravenous PRN Livesay, Lennis P, MD        PHYSICAL EXAMINATION: ECOG PERFORMANCE STATUS: 2 - Symptomatic, <50% confined to bed  Vitals with BMI 01/19/2019  Height 5'5"  Weight 139 lbs 8 oz  BMI 97.35  Systolic 329  Diastolic 71  Pulse 75     GENERAL:alert, no distress and comfortable SKIN: skin color, texture, turgor are normal, no rashes or significant lesions EYES: normal, Conjunctiva are pink and non-injected, sclera clear  NECK: supple, thyroid normal size, non-tender, without nodularity LYMPH:  no palpable lymphadenopathy in the cervical, axillary  LUNGS: clear to auscultation and percussion with normal breathing effort HEART: regular rate & rhythm and no murmurs and no lower extremity edema ABDOMEN:abdomen soft, non-tender and normal bowel sounds Musculoskeletal:no cyanosis of digits and no clubbing  NEURO: alert & oriented x 3 with fluent speech, no focal motor/sensory deficits  LABORATORY DATA:  I have reviewed the data as listed CBC Latest Ref Rng & Units 01/19/2019 01/11/2019 01/02/2019  WBC 4.0 - 10.5 K/uL 3.3(L) 2.9(L) 1.5(L)  Hemoglobin 12.0 - 15.0 g/dL 10.2(L) 10.7(L) 10.3(L)  Hematocrit 36.0 - 46.0 % 30.2(L) 32.2(L)  30.4(L)  Platelets 150 - 400 K/uL 188 224 232     CMP Latest Ref Rng & Units 01/19/2019 01/11/2019 01/02/2019  Glucose 70 - 99 mg/dL 118(H) 89 105(H)  BUN 6 - 20 mg/dL _0 Creatinine 0.44 - 1.00 mg/dL 0.81 0.82 0.80  Sodium 135 - 145 mmol/L 141 142 139  Potassium 3.5 - 5.1 mmol/L 3.2(L) 3.5 3.5  Chloride 98 - 111 mmol/L 106 107 101  CO2 22 - 32 mmol/L _1 Calcium 8.9 - 10.3 mg/dL 8.7(L) 8.6(L) 9.8  Total Protein 6.5 - 8.1 g/dL 6.3(L) 6.3(L) 6.1(L)  Total Bilirubin 0.3 - 1.2 mg/dL 1.2 1.3(H) 2.4(H)  Alkaline Phos 38 - 126 U/L 54 62 64  AST 15 - 41 U/L 26 29 76(H)  ALT 0 - 44 U/L 48(H) 50(H) 125(H)      RADIOGRAPHIC STUDIES: I have personally reviewed the radiological images as listed and agreed with the findings in the report. No results found.   ASSESSMENT & PLAN:  [name] is a 54 y.o. 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 She had right mastectomy, 4 cycles of AC-T and 5 years of Tamoxifen/AI.  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 She has received Target RT. She had excellent response to First-line Docetaxel/Herceptin/Perjeta and AI but progressed on maintenance therapy. She had mixed response on second line Kadcyla, progressed on third-line Ibrance/Herceptin and poorly tolerated Enhertu.  -She tried Tucatinib (TUKYSA)with low dose on 12/12/18 however due to N&V, poor appetite and HA she stopped on 12/17/18 after trying twice. -I discussed other treatment options includes other oralHER2 antibody Neratinibor Fulvestrant injection which I recommend, or iv chemo. She declinedany oral or injection agents, due to her fare of injection related pain, and her previous poor tolerance to other oral her2 antibodies. -I started her on next line  iv Eribulin on day 1, 8, every 21 days on 12/21/18, along with Herceptin  every 3 weeks   -S/p C2D1 she has had mild diarrhea, N&V and HA's. She has been able to maintain her weight overall. She is starting to have hair loss again.  -Labs reviewed and adequate to proceed  with C2D8 Eribulin today. She will start Onpro patch as well.  -Continue Herceptin q3weeks.  -Plan to repeat scan after C4.  -F/u in 2 weeks   2. N&V  -Secondary to chemo  -Continue prophylactic Compazine or Zofran. I offered sublingual Zofran,she declined for now. -Aloxi was added with C2 Erbilin.  -Mild currently.  3.Body pain, secondary to bone mets, Hypocalcemia, leg cramps  -Since her cancer diagnosis,she has had b/l hip, shoulder and chest painat different time points, likely related to her bone mets -She declined Gabapentin given risk of suicidal thoughts. -She is being treated with Zometa q66month. She does have hypocalcemia with leg cramps secondary to Zometa.  -She will continue oral calcium and Vit D. -For her Acid reflux related chest pain, I will call in Prilosec today (01/19/19) -Current pain includes, lower back, left upper left and recently her right leg and shoulder. She has increased use to 5-111m1-2 times a day. I refilled Oxycodone today (01/19/19)  4.Congenital deafness  5.Mild anemia, secondary to chemotherapy and malignancy -She will start with oral iron daily for 1-2 months and then stop. She will also start daily women's vitamin. -Stable mild anemia   6.Hypokalemia, Diarrhea -She developed diarrhea s/p cycle 3 Perjeta.Has been d/c -Istarted her onMag 40044maily and Oral potassium 62m62mID -Given K 3.2 today (01/19/19), Will increase oral potassium to QID for 3 days then return to TID.   7.Goal of care discussion -she understand her goal of care is palliative to prolong her life -she is full code for now  8.Insomnia -She cannot take Benadryl due to allergy reaction. She has tried trazodone with no relief.  -She is on Xanax and Ambien 1-2  tabs nightly.I advised her to not take them at the same time. I will call in long acting Ambien for next refill.   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 She declined using ice bags using infusion -stable   PLAN: -I will refill her Oxycodone 5mg 56m Prilosec today  -Labs reviewed and adequate to proceed with C2D8 Eribulin today with Onpro patch  -Lab, flush, and Eribulinin 2, 3, 4 weeks, Herceptin in 2 weeks  -F/u in 2 weeks  -I provided her with number to call the clinic.    No problem-specific Assessment & Plan notes found for this encounter.   No orders of the defined types were placed in this encounter.  All questions were answered. The patient knows to call the clinic with any problems, questions or concerns. No barriers to learning was detected. The total time spent in the appointment was 30 minutes.      FTruitt Merle1/07/2019   I, AmoyaJoslyn Devonacting as scribe for  FTruitt Merle   I have reviewed the above documentation for accuracy and completeness, and I agree with the above.

## 2019-01-19 ENCOUNTER — Inpatient Hospital Stay: Payer: Medicaid Other

## 2019-01-19 ENCOUNTER — Inpatient Hospital Stay (HOSPITAL_BASED_OUTPATIENT_CLINIC_OR_DEPARTMENT_OTHER): Payer: Medicaid Other | Admitting: Hematology

## 2019-01-19 ENCOUNTER — Inpatient Hospital Stay: Payer: Medicaid Other | Attending: Hematology

## 2019-01-19 ENCOUNTER — Other Ambulatory Visit: Payer: Self-pay

## 2019-01-19 ENCOUNTER — Encounter: Payer: Self-pay | Admitting: Hematology

## 2019-01-19 VITALS — BP 119/71 | HR 75 | Temp 97.9°F | Resp 18 | Wt 139.5 lb

## 2019-01-19 DIAGNOSIS — G620 Drug-induced polyneuropathy: Secondary | ICD-10-CM | POA: Diagnosis not present

## 2019-01-19 DIAGNOSIS — C50911 Malignant neoplasm of unspecified site of right female breast: Secondary | ICD-10-CM

## 2019-01-19 DIAGNOSIS — E876 Hypokalemia: Secondary | ICD-10-CM | POA: Diagnosis not present

## 2019-01-19 DIAGNOSIS — Z5189 Encounter for other specified aftercare: Secondary | ICD-10-CM | POA: Diagnosis not present

## 2019-01-19 DIAGNOSIS — C7951 Secondary malignant neoplasm of bone: Secondary | ICD-10-CM | POA: Diagnosis not present

## 2019-01-19 DIAGNOSIS — Z5111 Encounter for antineoplastic chemotherapy: Secondary | ICD-10-CM | POA: Insufficient documentation

## 2019-01-19 DIAGNOSIS — G4700 Insomnia, unspecified: Secondary | ICD-10-CM | POA: Insufficient documentation

## 2019-01-19 DIAGNOSIS — Z5112 Encounter for antineoplastic immunotherapy: Secondary | ICD-10-CM | POA: Insufficient documentation

## 2019-01-19 DIAGNOSIS — H905 Unspecified sensorineural hearing loss: Secondary | ICD-10-CM | POA: Insufficient documentation

## 2019-01-19 DIAGNOSIS — Z170 Estrogen receptor positive status [ER+]: Secondary | ICD-10-CM | POA: Diagnosis not present

## 2019-01-19 DIAGNOSIS — D6481 Anemia due to antineoplastic chemotherapy: Secondary | ICD-10-CM | POA: Diagnosis not present

## 2019-01-19 DIAGNOSIS — R112 Nausea with vomiting, unspecified: Secondary | ICD-10-CM | POA: Diagnosis not present

## 2019-01-19 DIAGNOSIS — R630 Anorexia: Secondary | ICD-10-CM | POA: Insufficient documentation

## 2019-01-19 LAB — CBC WITH DIFFERENTIAL (CANCER CENTER ONLY)
Abs Immature Granulocytes: 0.11 10*3/uL — ABNORMAL HIGH (ref 0.00–0.07)
Basophils Absolute: 0.1 10*3/uL (ref 0.0–0.1)
Basophils Relative: 2 %
Eosinophils Absolute: 0 10*3/uL (ref 0.0–0.5)
Eosinophils Relative: 1 %
HCT: 30.2 % — ABNORMAL LOW (ref 36.0–46.0)
Hemoglobin: 10.2 g/dL — ABNORMAL LOW (ref 12.0–15.0)
Immature Granulocytes: 3 %
Lymphocytes Relative: 26 %
Lymphs Abs: 0.8 10*3/uL (ref 0.7–4.0)
MCH: 30 pg (ref 26.0–34.0)
MCHC: 33.8 g/dL (ref 30.0–36.0)
MCV: 88.8 fL (ref 80.0–100.0)
Monocytes Absolute: 0.2 10*3/uL (ref 0.1–1.0)
Monocytes Relative: 5 %
Neutro Abs: 2.1 10*3/uL (ref 1.7–7.7)
Neutrophils Relative %: 63 %
Platelet Count: 188 10*3/uL (ref 150–400)
RBC: 3.4 MIL/uL — ABNORMAL LOW (ref 3.87–5.11)
RDW: 13.9 % (ref 11.5–15.5)
WBC Count: 3.3 10*3/uL — ABNORMAL LOW (ref 4.0–10.5)
nRBC: 0 % (ref 0.0–0.2)

## 2019-01-19 LAB — CMP (CANCER CENTER ONLY)
ALT: 48 U/L — ABNORMAL HIGH (ref 0–44)
AST: 26 U/L (ref 15–41)
Albumin: 3.7 g/dL (ref 3.5–5.0)
Alkaline Phosphatase: 54 U/L (ref 38–126)
Anion gap: 11 (ref 5–15)
BUN: 9 mg/dL (ref 6–20)
CO2: 24 mmol/L (ref 22–32)
Calcium: 8.7 mg/dL — ABNORMAL LOW (ref 8.9–10.3)
Chloride: 106 mmol/L (ref 98–111)
Creatinine: 0.81 mg/dL (ref 0.44–1.00)
GFR, Est AFR Am: >60 mL/min (ref >60)
GFR, Estimated: >60 mL/min (ref >60)
Glucose, Bld: 118 mg/dL — ABNORMAL HIGH (ref 70–99)
Potassium: 3.2 mmol/L — ABNORMAL LOW (ref 3.5–5.1)
Sodium: 141 mmol/L (ref 135–145)
Total Bilirubin: 1.2 mg/dL (ref 0.3–1.2)
Total Protein: 6.3 g/dL — ABNORMAL LOW (ref 6.5–8.1)

## 2019-01-19 MED ORDER — HEPARIN SOD (PORK) LOCK FLUSH 100 UNIT/ML IV SOLN
500.0000 [IU] | Freq: Once | INTRAVENOUS | Status: AC | PRN
  Administered 2019-01-19: 500 [IU]
  Filled 2019-01-19: qty 5

## 2019-01-19 MED ORDER — PALONOSETRON HCL INJECTION 0.25 MG/5ML
0.2500 mg | Freq: Once | INTRAVENOUS | Status: AC
  Administered 2019-01-19: 10:00:00 0.25 mg via INTRAVENOUS

## 2019-01-19 MED ORDER — OMEPRAZOLE 20 MG PO CPDR: 20.0000 mg | DELAYED_RELEASE_CAPSULE | Freq: Every day | ORAL | 3 refills | Status: DC

## 2019-01-19 MED ORDER — SODIUM CHLORIDE 0.9 % IV SOLN: 5.0000 mg | Freq: Once | INTRAVENOUS | Status: DC

## 2019-01-19 MED ORDER — PEGFILGRASTIM 6 MG/0.6ML ~~LOC~~ PSKT
6.0000 mg | PREFILLED_SYRINGE | Freq: Once | SUBCUTANEOUS | Status: AC
  Administered 2019-01-19: 6 mg via SUBCUTANEOUS

## 2019-01-19 MED ORDER — SODIUM CHLORIDE 0.9% FLUSH
10.0000 mL | INTRAVENOUS | Status: DC | PRN
  Administered 2019-01-19: 10 mL
  Filled 2019-01-19: qty 10

## 2019-01-19 MED ORDER — DEXAMETHASONE SODIUM PHOSPHATE 10 MG/ML IJ SOLN
5.0000 mg | Freq: Once | INTRAMUSCULAR | Status: AC
  Administered 2019-01-19: 5 mg via INTRAVENOUS

## 2019-01-19 MED ORDER — SODIUM CHLORIDE 0.9 % IV SOLN
1.4000 mg/m2 | Freq: Once | INTRAVENOUS | Status: AC
  Administered 2019-01-19: 2.4 mg via INTRAVENOUS
  Filled 2019-01-19: qty 4.8

## 2019-01-19 MED ORDER — DEXAMETHASONE SODIUM PHOSPHATE 10 MG/ML IJ SOLN
INTRAMUSCULAR | Status: AC
  Filled 2019-01-19: qty 1

## 2019-01-19 MED ORDER — OXYCODONE HCL 5 MG PO TABS: 5.0000 mg | ORAL_TABLET | Freq: Three times a day (TID) | ORAL | 0 refills | Status: DC | PRN

## 2019-01-19 MED ORDER — PEGFILGRASTIM 6 MG/0.6ML ~~LOC~~ PSKT
PREFILLED_SYRINGE | SUBCUTANEOUS | Status: AC
  Filled 2019-01-19: qty 0.6

## 2019-01-19 MED ORDER — PALONOSETRON HCL INJECTION 0.25 MG/5ML
INTRAVENOUS | Status: AC
  Filled 2019-01-19: qty 5

## 2019-01-19 NOTE — Patient Instructions (Signed)
[facility] Discharge Instructions for Patients Receiving Chemotherapy  Today you received the following chemotherapy agents: Eribulin.  To help prevent nausea and vomiting after your treatment, we encourage you to take your nausea medication as directed.   If you develop nausea and vomiting that is not controlled by your nausea medication, call the clinic.   BELOW ARE SYMPTOMS THAT SHOULD BE REPORTED IMMEDIATELY:  *FEVER GREATER THAN 100.5 F  *CHILLS WITH OR WITHOUT FEVER  NAUSEA AND VOMITING THAT IS NOT CONTROLLED WITH YOUR NAUSEA MEDICATION  *UNUSUAL SHORTNESS OF BREATH  *UNUSUAL BRUISING OR BLEEDING  TENDERNESS IN MOUTH AND THROAT WITH OR WITHOUT PRESENCE OF ULCERS  *URINARY PROBLEMS  *BOWEL PROBLEMS  UNUSUAL RASH Items with * indicate a potential emergency and should be followed up as soon as possible.  Feel free to call the clinic should you have any questions or concerns. The clinic phone number is [phone].  Please show the CHEMO ALERT CARD at check-in to the Emergency Department and triage nurse.   

## 2019-01-27 NOTE — Progress Notes (Signed)
[name]   [phone] [phone]   Clinic Follow up Note   Patient Care Team: [name], [name], [name] as PCP - General (Physician Assistant) [name], MD as Consulting Physician (Orthopedic Surgery)  Date of Service:  02/02/2019  CHIEF COMPLAINT: F/u ofmetastatic breast cancer  SUMMARY OF ONCOLOGIC HISTORY: Oncology History Overview Note   Cancer Staging No matching staging information was found for the patient.     [name], right ([place])  11/1997 Cancer Diagnosis   Patient had 1.4 cm poorly differentiated right breast carcinoma diagnosed in Nov 1999 at age 10, 1/7 axillary nodes involved, ER/PR and HER 2 positive. She had mastectomy with the 7 axillary node evaluation, 4 cycles of adriamycin/ cytoxan followed by taxotere, then five years of tamoxifen thru June 2005 (no herceptin in 1999). She had bilateral oophorectomy in May 2005. She was briefly on aromatase inhibitor after tamoxifen, but discontinued this herself due to poor tolerance.   04/04/2015 Genetic Testing   Negative genetic testing on the breast/ovarian cancer pnael.  The Breast/Ovarian gene panel offered by [facility] includes sequencing and rearrangement analysis for the following 20 genes:  ATM, BARD1, BRCA1, BRCA2, BRIP1, CDH1, CHEK2, EPCAM, FANCC, MLH1, MSH2, MSH6, NBN, PALB2, PMS2, PTEN, RAD51C, RAD51D, TP53, and XRCC2.   06/26/2015 Pathology Results   Initial Path Report Bone, curettage, Left femur met, intramedullary subtroch tissue - METASTATIC ADENOCARCINOMA.  Estrogen Receptor: 95%, POSITIVE, STRONG STAINING INTENSITY Progesterone Receptor: 2%, POSITIVE, STRONG  HER2 (+), IHC 3+   06/26/2015 Surgery   Biopsy of metastatic tissue and stabilization with Affixus trochanteric nail, proximal and distal interlock of left femur by Dr. [name]    06/28/2015 Imaging   CT Chest w/ Contrast 1. Extensive right internal mammary chain  lymphadenopathy consistent with metastatic breast cancer. 2. Lytic metastases involving the posterior elements at T1 with probable nondisplaced pathologic fracture of the spinous process. 3. No other evidence of thoracic metastatic disease. 4. Trace bilateral pleural effusions with associated bibasilar atelectasis. 5. Indeterminate right thyroid nodule, not previously imaged. This could be evaluated with thyroid ultrasound as clinically warranted.   07/01/2015 Imaging   Bone Scan 1. Increased activity noted throughout the left femur. Although metastatic disease cannot be excluded, these changes are most likely secondary to prior surgery. 2. No other focal abnormalities identified to suggest metastatic disease.   07/12/2015 - 07/26/2015 Radiation Therapy   Left femur, 30 Gy in 10 fractions by Dr. Sondra Come    07/14/2015 Initial Diagnosis   [name], right ([place])   07/19/2015 Imaging   Initial PET Scan 1. Severe multifocal osseous metastatic disease, much greater than anticipated based on prior imaging. 2. Multifocal nodal metastases involving the right internal mammary, prevascular and subpectoral lymph nodes. No axillary pulmonary involvement identified. 3. No distant extra osseous metastases.   07/30/2015 - 11/29/2015 Chemotherapy   Docetaxel, Herceptin and Perjeta every 3 weeks X6 cycles, pt tolerated moderately well, restaging scan showed excellent response    09/18/2015 Imaging   CT Head w/wo Contrast 1. No acute intracranial abnormality or significant interval change. 2. Stable minimal periventricular white matter hypoattenuation on the right. This may reflect a remote ischemic injury. 3. No evidence for metastatic disease to the brain. 4. No focal soft tissue lesion to explain the patient's tenderness.   10/30/2015 Imaging   CT Abdomen Pelvis w/ Contrast 1. Few mildly prominent fluid-filled loops of small bowel scattered throughout the abdomen, with  intraluminal fluid density within the distal colon. Given the provided history, findings are suggestive of acute enteritis/diarrheal illness. 2. No other acute intra-abdominal or pelvic process identified. 3. Widespread osseous metastatic disease, better evaluated on most recent PET-CT from 07/19/2015. No pathologic fracture or other complication. 4. 11 mm hypodensity within the left kidney. This lesion measures intermediate density, and is indeterminate. While this lesion is similar in size relative to recent studies, this is increased in size relative to prior study from 2012. Further evaluation with dedicated renal mass protocol CT and/or MRI is recommended for complete characterization.   12/2015 - 05/14/2016 Chemotherapy   Maintenance Herceptin and pejeta every 3 weeks stopped on 05/14/16 due to disease progression   12/25/2015 Imaging   Restaging PET Scan 1. Markedly improved skeletal activity, with only several faint foci of residual accentuated metabolic activity, but resolution of the vast majority of the previously extensive osseous metastatic disease. 2. Resolution of the prior right internal mammary and right prevascular lymph nodes. 3. Residual subcutaneous edema and edema along fascia planes in the left upper thigh. This remains somewhat more than I would expect for placement of an IM nail 6 weeks ago. If there is leg swelling further down, consider Doppler venous ultrasound to rule out left lower extremity DVT. I do not perceive an obvious difference in density between the pelvic and common femoral veins on the noncontrast CT data. 4. Chronic right maxillary and right sphenoid sinusitis.   01/16/2016 - 10/20/2017 Anti-estrogen oral therapy   Letrozole 2.5 mg daily. She was switched to exemestane due to disease progression on 02/08/17. Stopped on 10/20/2017 when treatment changed to chemo    03/01/2016 Miscellaneous   Patient presented to ED following a fall; she reports she landed on her  back and is now experiencing back pain. The patient was evaluated and discharge home same day with pain control.   05/01/2016 Imaging   CT CAP IMPRESSION: 1. Stable exam.  No new or progressive disease identified. 2. No evidence for residual or recurrent adenopathy within the chest. 3. Stable bone metastasis.   05/01/2016 Imaging   BONE SCAN IMPRESSION: 1. Small focal uptake involving the anterior rib ends of the left second and right fourth ribs, new since the prior bone scan. The location of this uptake is more suggestive of a traumatic or inflammatory etiology as opposed to metastatic disease. No other evidence of metastatic disease. 2. There are other areas of stable uptake which are most likely degenerative/reactive, including right shoulder and cervical spine uptake and uptake along the right femur adjacent to the ORIF hardware.   05/20/2016 Imaging   NM PET Skull to Thigh IMPRESSION: 1. There multiple metastatic foci in the bony pelvis, including some new abnormal foci compare to the prior PET-CT, compatible with mildly progressive bony metastatic disease. Also, a left T11 vertebral hypermetabolic metastatic lesion is observed, new compared to the prior exam. 2. No active extraosseous malignancy is currently identified. 3. Right anterior fourth rib healing fracture, appears be   06/04/2016 - 01/08/2017 Chemotherapy   Second line chemotherapy Kadcyla every 3 weeks started on 06/04/16 and stopped on 01/08/17 due to mixed response.     10/05/2016 Imaging   CT CAP and Bone Scan:  Bones: left intramedullary rod and left femoral head neck screw in place. In the proximal diaphysis of the left femur there is cortical thickening and irregularity. No lytic or blastic bone lesions. No fracture or vertebral endplate destruction.   No definite evidence of recurrent  disease in the chest, abdomen, or pelvis.   10/28/2016 Imaging   CT A/P IMPRESSION: No acute process demonstrated in  the abdomen or pelvis. No evidence of bowel obstruction or inflammation.   11/04/2016 Imaging   DG foot complete left: IMPRESSION: No fracture or dislocation.  No soft tissue abnormality   11/23/2016 Imaging   CT RIGHT FEMUR IMPRESSION: Negative CT scan of the right thigh.  No visible metastatic disease.  CT LEFT FEMUR IMPRESSION: 1. Postsurgical changes related to prior cephalomedullary rod fixation of the left femur with linear lucency along the anterolateral proximal femoral cortex at level of the lesser trochanter, suspicious for nondisplaced fracture. 2. Slightly permeative appearance of the proximal left femoral cortex at the site of known prior osseous metastases is largely unchanged. 3. Unchanged patchy lucency and sclerosis along the anterior acetabulum, which appears to correspond with an area of faint uptake on the prior PET-CT, suspicious for osseous metastasis. These results will be called to the ordering clinician or representative by the Radiologist Assistant, and communication documented in the PACS or zVision Dashboard.     01/27/2017 Imaging   IMPRESSION: 1. Mixed response to osseous metastasis. Some hypermetabolic lesions are new and increasingly hypermetabolic, while another index lesion is decreasingly hypermetabolic. 2. No evidence of hypermetabolic soft tissue metastasis.    02/08/2017 - 10/19/2017 Antibody Plan   -Herceptin every 3 week since 02/08/17 -Oral Lanpantinib 1548m and decreased to 5027mdue to diarrhea and depression. Due to disease progression she swithced to Verzenio 15032mn 05/04/17. She did not toelrate well so she was switched to Ibrance 100m35m 05/31/17. Due to her neutropenia, Ibrance dose reduced to 75 mg daily, 3 weeks on, one-week off, stopped on 10/20/2017 due to disease progression.    04/30/2017 PET scan   IMPRESSION: 1. Primarily increase in hypermetabolism of osseous metastasis. An isolated right acetabular lesion is  decreased in hypermetabolism since the prior exam. 2. No evidence of hypermetabolic soft tissue metastasis. 3. Similar non FDG avid right-sided thyroid nodule, favoring a benign etiology. Recommend attention on follow-up.   08/30/2017 PET scan   08/30/2017 PET Scan  IMPRESSION: 1. Worsening osseous metastatic disease. 2. Focal hypermetabolism in the central spinal canal the L1 level, similar to the prior exam. Metastatic implant cannot be excluded. 3. Slight enlargement of a right thyroid nodule which now appears more cystic in character. Consider further evaluation with thyroid ultrasound. If patient is clinically hyperthyroid, consider nuclear medicine thyroid uptake and scan   08/30/2017 Progression   08/30/2017 PET Scan  IMPRESSION: 1. Worsening osseous metastatic disease. 2. Focal hypermetabolism in the central spinal canal the L1 level, similar to the prior exam. Metastatic implant cannot be excluded. 3. Slight enlargement of a right thyroid nodule which now appears more cystic in character. Consider further evaluation with thyroid ultrasound. If patient is clinically hyperthyroid, consider nuclear medicine thyroid uptake and scan   09/29/2017 - 10/11/2017 Radiation Therapy   Pt received 27 Gy in 9 fractions directed at the areas of significant uptake noted on recent bone scan the right pelvis and right proximal femur, with Dr, KinaSondra Come10/09/2017 - 04/07/2018 Chemotherapy   -Herceptin every 3 weeks starting 02/08/17, perjeta added on 10/20/2017  -weekly Taxol started on 10/21/2017. Will reduce Taxol to 2 weeks on, 1 week off starting on 02/04/17.  Stoped due to mild disease progression   10/27/2017 Imaging   10/27/2017 CT AP IMPRESSION: 1. No acute abnormality. No evidence of bowel obstruction or acute bowel  inflammation. Normal appendix. 2. Stable patchy sclerotic osseous metastases throughout the visualized skeleton. No new or progressive metastatic disease in the abdomen  or pelvis.   01/11/2018 Imaging   Whole Body Bone Scan 01/11/18  IMPRESSION: 1. Multifocal osseous metastases as described. 2. Right scapular metastasis. 3. Right superior thoracic spinous process. 4. Right sacral ala and right L5 metastases. 5. Right acetabular metastasis. 6. Right proximal femur metastasis. 7. Diffuse metastases throughout the left femur. 8. Anterior right fourth rib metastasis.   01/11/2018 Imaging   CT CAP W Contrast 01/11/18  IMPRESSION: 1. Similar-appearing osseous metastatic disease. 2. No evidence for progressed metastatic disease in the chest, abdomen or pelvis.   03/30/2018 Imaging   Bone Scan 03/30/18  IMPRESSION: Stable multifocal bony metastatic disease.   03/30/2018 Imaging    CT CAP W Contrast 03/30/18 IMPRESSION: 1. New mild contiguous wall thickening with associated mucosal hyperenhancement throughout the left colon and rectum, suggesting nonspecific infectious or inflammatory proctocolitis. Differential includes C diff colitis. 2. Stable faint patchy sclerosis in the iliac bones and lumbar vertebral bodies. No new or progressive osseous metastatic disease by CT. Please note that the osseous lesions are relatively occult by CT and would be better evaluated by PET-CT, as clinically warranted. 3. No evidence of extra osseous metastatic disease.  These results will be called to the ordering clinician or representative by the Radiologist Assistant, and communication documented in the PACS or zVision Dashboard.   04/15/2018 - 06/16/2018 Chemotherapy   Enhertu q3weeks starting 04/15/18. Stopped Enhertu after cycle 3 on 05/27/18 due to poor toleration due to poor toleration with anorexia, weight loss, N&V, diarrhea   04/21/2018 PET scan   PET 04/21/18  IMPRESSION: 1. Overall marked interval improvement in bony hypermetabolic disease. All of the previously identified hypermetabolic osseous metastases have decreased in hypermetabolism in the interval.  There is a new tiny focus of hypermetabolism in the right aspect of the L3 vertebral body that appears to correspond to a new 8 mm lucency, raising concern for new metastatic disease. Close attention on follow-up recommended. 2. Focal hypermetabolism in the central spinal canal at the level of L1 previously has resolved. 3. No hypermetabolic soft tissue disease on today's study. 4. Right thyroid nodule seen previously has decreased in the interval.    06/16/2018 Imaging   CT LUMBAR SPINE WO CONTRAST 06/16/18 IMPRESSION: 1. Transitional lumbosacral anatomy. Correlation with radiographs is recommended prior to any operative intervention. 2. Suspicion of a right L3 vertebral body metastasis on the April PET-CT is not evident by CT. Stable degenerative versus metastatic lesion at the right L5 superior endplate. Small but increased right iliac bone metastasis since March. Lumbar MRI would best characterize lumbar metastatic disease (without contrast if necessary). 3. Suspect L2-L3 left subarticular segment disc herniation. Query left L2 radiculitis. 4. Stable appearance of rightward L4-L5 disc degeneration which could affect the right foramen and right lateral recess.   06/17/2018 - 11/16/2018 Chemotherapy   Restart Taxol 2 weeks on/1 week off with Herceptin and Perjeta q3weeks on 06/17/18. Perjeta was stopped after C5 due to diarrhea. Stopped Taxol after 11/16/18 due to disease progression to L3.    07/06/2018 PET scan   PET  IMPRESSION: 1. Near total resolution of hypermetabolic activity associated with skeletal metastasis. Very mild activity associated with the LEFT iliac bone which is similar to background blood pool activity. 2. No new or progressive disease.   11/21/2018 PET scan   IMPRESSION: 1. New focal hypermetabolic lesion eccentric to the right  in the L3 vertebral body, maximum SUV 6.1 compatible with a new focus of active malignancy. 2. Very low-grade activity similar to  blood pool in previous existing mildly sclerotic metastatic lesions including the left iliac bone lesion. Some of the mildly sclerotic lesions are not hypermetabolic compatible with effectively treated bony metastatic disease. 3. No hypermetabolic soft tissue lesions are identified.     11/30/2018 -  Chemotherapy   Continue Herceptin q3weeks    12/12/2018 - 12/17/2018 Chemotherapy   Tucatinib (TUKYSA) 6 tabs BID as a next line therapy starting 12/12/18. Stopped 12/17/18 duet o N&V, poor appetite and HA    12/21/2018 -  Chemotherapy   IV Eribulin on day 1, 8 every 21 days starting 12/21/18      CURRENT THERAPY:  -Continue Herceptin q3weeks -IVEribulin on day 1, 8 every 21 days starting 12/21/18. Onpro patch added C2D8.   INTERVAL HISTORY:  [name] is here for a follow up and treatment. She presents to the clinic with her interpretor.  She notes with last cycle she was able to tolerate well with no N&V, no bone pain. She was able to do things as needed at home although she is slow. She notes right neck cramping which occurs rarely. This pain can lead to numbness in her right hand. She also notes mild LE edema.    REVIEW OF SYSTEMS:   Constitutional: Denies fevers, chills or abnormal weight loss Eyes: Denies blurriness of vision Ears, nose, mouth, throat, and face: Denies mucositis or sore throat Respiratory: Denies cough, dyspnea or wheezes Cardiovascular: Denies palpitation, chest discomfort or lower extremity swelling Gastrointestinal:  Denies nausea, heartburn or change in bowel habits Skin: Denies abnormal skin rashes Lymphatics: Denies new lymphadenopathy or easy bruising Neurological:Denies numbness, tingling or new weaknesses  MSK: (+) Rate right neck cramps.  Behavioral/Psych: Mood is stable, no new changes  All other systems were reviewed with the patient and are negative.  MEDICAL HISTORY:  Past Medical History:  Diagnosis Date  . Abnormal Pap smear   .  Anxiety   . Breast cancer ([place])   . Cancer ([place]) 1999   breast-s/p mastectomy, chemo, rad  . CIN I (cervical intraepithelial neoplasia I) 2003   by colpo  . Congenital deafness   . Deaf   . Depression   . Port-A-Cath in place 2017   for chemotherapy  . Radiation 07/12/15-07/26/15   left femur 30 Gy  . S/P bilateral oophorectomy     SURGICAL HISTORY: Past Surgical History:  Procedure Laterality Date  . ABDOMINAL HYSTERECTOMY    . INTRAMEDULLARY (IM) NAIL INTERTROCHANTERIC Left 06/26/2015   Procedure: LEFT BIOMET LONG AFFIXS NAIL;  Surgeon: [name], MD;  Location: [facility];  Service: Orthopedics;  Laterality: Left;  . IR GENERIC HISTORICAL  08/06/2015   IR US GUIDE VASC ACCESS LEFT 08/06/2015 Aletta Edouard, MD [facility]  . IR GENERIC HISTORICAL  08/06/2015   IR FLUORO GUIDE CV LINE LEFT 08/06/2015 Aletta Edouard, MD [facility]  . IR GENERIC HISTORICAL  03/05/2016   IR CV LINE INJECTION 03/05/2016 Markus Daft, MD [facility]  . LEFT HEART CATH AND CORONARY ANGIOGRAPHY N/A 12/13/2017   Procedure: LEFT HEART CATH AND CORONARY ANGIOGRAPHY;  Surgeon: Leonie Man, MD;  Location: [facility];  Service: Cardiovascular;  Laterality: N/A;  . MASTECTOMY     right breast  . MASTECTOMY    . OVARIAN CYST REMOVAL    . RADIOLOGY WITH ANESTHESIA Left 06/25/2015  Procedure: MRI OF LEFT HIP WITH OR WITHOUT CONTRAST;  Surgeon: Medication Radiologist, MD;  Location: [facility];  Service: Radiology;  Laterality: Left;  DR. MCINTYRE/MRI  . TUBAL LIGATION      I have reviewed the social history and family history with the patient and they are unchanged from previous note.  ALLERGIES:  is allergic to promethazine hcl; chlorhexidine; and diphenhydramine hcl.  MEDICATIONS:  Current Outpatient Medications  Medication Sig Dispense Refill  . albuterol (PROAIR HFA) 108 (90 Base) MCG/ACT inhaler Inhale 2 puffs into the lungs every 6 (six) hours as needed for wheezing or shortness of breath.    Marland Kitchen  albuterol (PROVENTIL HFA;VENTOLIN HFA) 108 (90 Base) MCG/ACT inhaler Inhale 2 puffs into the lungs every 6 (six) hours as needed for wheezing or shortness of breath. 1 Inhaler 0  . ALPRAZolam (XANAX) 0.25 MG tablet Take 1 tablet (0.25 mg total) by mouth 2 (two) times daily as needed for anxiety. 30 tablet 0  . diphenoxylate-atropine (LOMOTIL) 2.5-0.025 MG tablet Take 1-2 tablets by mouth 4 (four) times daily as needed for diarrhea or loose stools. 30 tablet 1  . ergocalciferol (VITAMIN D2) 1.25 MG (50000 UT) capsule Take 1 capsule (50,000 Units total) by mouth once a week. 8 capsule 0  . lidocaine-prilocaine (EMLA) cream Apply 1 application topically as needed. Apply to Porta-Cath 1-2 hours prior to access as directed. (Patient taking differently: Apply 1 application topically as needed (To Porta-Cath 1-2 hours prior to access as directed). ) 90 g 2  . magnesium oxide (MAG-OX) 400 (241.3 Mg) MG tablet Take 1 tablet (400 mg total) by mouth daily. 30 tablet 3  . methocarbamol (ROBAXIN) 500 MG tablet Take 1 tablet (500 mg total) by mouth 2 (two) times daily. (Patient taking differently: Take 500 mg by mouth 2 (two) times daily as needed for muscle spasms. ) 20 tablet 0  . omeprazole (PRILOSEC) 20 MG capsule Take 1 capsule (20 mg total) by mouth daily. 30 capsule 3  . ondansetron (ZOFRAN) 8 MG tablet Take 1 tablet (8 mg total) by mouth every 8 (eight) hours as needed for nausea or vomiting. 45 tablet 1  . oxyCODONE (ROXICODONE) 5 MG immediate release tablet Take 1-2 tablets (5-10 mg total) by mouth every 8 (eight) hours as needed for severe pain. 60 tablet 0  . potassium chloride (KLOR-CON) 10 MEQ tablet Take 3 tablets (30 mEq total) by mouth daily. 90 tablet 0  . prochlorperazine (COMPAZINE) 10 MG tablet Take 1 tablet (10 mg total) by mouth 2 (two) times daily as needed for nausea or vomiting. 10 tablet 0  . zolpidem (AMBIEN) 5 MG tablet Take 1-2 tablets (5-10 mg total) by mouth at bedtime as needed. 60  tablet 0   No current facility-administered medications for this visit.   Facility-Administered Medications Ordered in Other Visits  Medication Dose Route Frequency Provider Last Rate Last Admin  . potassium chloride SA (K-DUR) CR tablet 40 mEq  40 mEq Oral Once Truitt Merle, MD      . sodium chloride 0.9 % 1,000 mL with potassium chloride 10 mEq infusion   Intravenous Continuous Gordy Levan, MD   Stopped at 09/10/15 1653  . sodium chloride 0.9 % injection 10 mL  10 mL Intravenous PRN Livesay, Lennis P, MD        PHYSICAL EXAMINATION: ECOG PERFORMANCE STATUS: 1 - Symptomatic but completely ambulatory  Vitals:   02/02/19 1010  BP: 129/79  Pulse: 80  Temp: 98.2 F (36.8 C)  SpO2: 99%   Filed Weights   02/02/19 1010  Weight: 140 lb 14.4 oz (63.9 kg)    GENERAL:alert, no distress and comfortable SKIN: skin color, texture, turgor are normal, no rashes or significant lesions EYES: normal, Conjunctiva are pink and non-injected, sclera clear  NECK: supple, thyroid normal size, non-tender, without nodularity LYMPH:  no palpable lymphadenopathy in the cervical, axillary  LUNGS: clear to auscultation and percussion with normal breathing effort HEART: regular rate & rhythm and no murmurs and no lower extremity edema ABDOMEN:abdomen soft, non-tender and normal bowel sounds Musculoskeletal:no cyanosis of digits and no clubbing  NEURO: alert & oriented x 3 with fluent speech, no focal motor/sensory deficits  LABORATORY DATA:  I have reviewed the data as listed CBC Latest Ref Rng & Units 02/02/2019 01/19/2019 01/11/2019  WBC 4.0 - 10.5 K/uL 5.0 3.3(L) 2.9(L)  Hemoglobin 12.0 - 15.0 g/dL 9.9(L) 10.2(L) 10.7(L)  Hematocrit 36.0 - 46.0 % 29.9(L) 30.2(L) 32.2(L)  Platelets 150 - 400 K/uL 162 188 224     CMP Latest Ref Rng & Units 02/02/2019 01/19/2019 01/11/2019  Glucose 70 - 99 mg/dL 74 118(H) 89  BUN 6 - 20 mg/dL '7 9 9  '$ Creatinine 0.44 - 1.00 mg/dL 0.76 0.81 0.82  Sodium 135 - 145  mmol/L 143 141 142  Potassium 3.5 - 5.1 mmol/L 3.7 3.2(L) 3.5  Chloride 98 - 111 mmol/L 108 106 107  CO2 22 - 32 mmol/L '26 24 25  '$ Calcium 8.9 - 10.3 mg/dL 9.0 8.7(L) 8.6(L)  Total Protein 6.5 - 8.1 g/dL 6.1(L) 6.3(L) 6.3(L)  Total Bilirubin 0.3 - 1.2 mg/dL 1.0 1.2 1.3(H)  Alkaline Phos 38 - 126 U/L 77 54 62  AST 15 - 41 U/L '31 26 29  '$ ALT 0 - 44 U/L 49(H) 48(H) 50(H)      RADIOGRAPHIC STUDIES: I have personally reviewed the radiological images as listed and agreed with the findings in the report. No results found.   ASSESSMENT & PLAN:  AQUITA SIMMERING is a 54 y.o. 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 She had right mastectomy, 4 cycles of AC-T and 5 years of Tamoxifen/AI.  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 She has received Target RT. She had excellent response to First[facility] Docetaxel/Herceptin/Perjeta and AI but progressed on maintenance therapy. She had mixed response on second line Kadcyla, progressed on third-line Ibrance/Herceptin and poorly tolerated Enhertu.  -She triedTucatinib (TUKYSA)with low doseon 12/12/18 however due to N&V, poor appetite and HA she stopped on 12/17/18 after trying twice. -I started her on next lineiv chemo Eribulin on day 1, 8, every 21 days on 12/21/18, along with Herceptin every 3 weeks. She could not tolerate Perjeta due to diarrhea  -S/p C2 she is tolerating well. She has had no major side effects except mild nausea and neuropathy. Physical exam normal. Labs reviewed and CBC is responding well to treatment. Overall adequate to proceed with C3D1 today. I encouraged her to watch of neuropathy progressing.  -F/u in 3 weeks  -will repeat PET scan after 4 cycles chemo   2. N&V  -Secondary to chemo  -Continue prophylactic Compazineor Zofran. I offeredsublingual Zofran,she declined for  now. -Aloxi was added with C2 Erbilin.  -much improved   3.Body pain, secondary to bone mets, Hypocalcemia, leg cramps  -Since her cancer diagnosis,she has had b/l hip, shoulder and chest  painat different time points, likely related to her bone mets -She declined Gabapentin given risk of suicidal thoughts. -She is being treated with Zometa q73month. She does have hypocalcemia with leg cramps secondary to Zometa.  -She will continue oral calcium and Vit D and Prilosec for Acid Reflux.  -Current pain includes, lower back, left upper left and recently her right leg and shoulder. She has increased use to 5-'10mg'$  1-2 times a day.   4.Congenital deafness  5.Mild anemia, secondary to chemotherapy and malignancy -She will start with oral iron daily for 1-2 months and then stop. She will also start daily women's vitamin. -Stable mild anemia   6.Hypokalemia, Diarrhea -She developed diarrhea s/p cycle 3 Perjeta.Has been d/c -Istarted her onMag '400mg'$  daily and Oral potassium 143m TID  7.Goal of care discussion -she understand her goal of care is palliative to prolong her life -she is full code for now  8.Insomnia -She cannot take Benadryl due to allergy reaction. She has tried trazodone with no relief.  -She is on Xanax and Ambien 1-2 tabs nightly.I advised her to not take them at the same time. I will call in long acting Ambien for next refill.   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 She declined using ice bags using infusion -stable. Will watch on chemo.    PLAN: -Labs reviewed and adequate to proceed with C3D1 Eribulin today and next week, with Onpro patch on day 8   -Lab, flush, and Eribulinin 1, 3,  4 weeks  -F/u in 3 weeks, will order PET scan on next visit  -continue zometa every 3 months, next due in mid Feb    No problem-specific Assessment & Plan notes found for this encounter.   No orders of the defined types were placed in this  encounter.  All questions were answered. The patient knows to call the clinic with any problems, questions or concerns. No barriers to learning was detected. The total time spent in the appointment was 30 minutes.     YaTruitt MerleMD 02/02/2019   I, AmJoslyn Devonam acting as scribe for YaTruitt MerleMD.   I have reviewed the above documentation for accuracy and completeness, and I agree with the above.

## 2019-02-02 ENCOUNTER — Encounter: Payer: Self-pay | Admitting: Hematology

## 2019-02-02 ENCOUNTER — Inpatient Hospital Stay: Payer: Medicaid Other

## 2019-02-02 ENCOUNTER — Other Ambulatory Visit: Payer: Self-pay

## 2019-02-02 ENCOUNTER — Inpatient Hospital Stay (HOSPITAL_BASED_OUTPATIENT_CLINIC_OR_DEPARTMENT_OTHER): Payer: Medicaid Other | Admitting: Hematology

## 2019-02-02 VITALS — BP 129/79 | HR 80 | Temp 98.2°F | Ht 65.0 in | Wt 140.9 lb

## 2019-02-02 DIAGNOSIS — C7951 Secondary malignant neoplasm of bone: Secondary | ICD-10-CM | POA: Diagnosis not present

## 2019-02-02 DIAGNOSIS — C50911 Malignant neoplasm of unspecified site of right female breast: Secondary | ICD-10-CM | POA: Diagnosis not present

## 2019-02-02 DIAGNOSIS — Z95828 Presence of other vascular implants and grafts: Secondary | ICD-10-CM

## 2019-02-02 DIAGNOSIS — Z5112 Encounter for antineoplastic immunotherapy: Secondary | ICD-10-CM | POA: Diagnosis not present

## 2019-02-02 DIAGNOSIS — C50919 Malignant neoplasm of unspecified site of unspecified female breast: Secondary | ICD-10-CM

## 2019-02-02 LAB — CMP (CANCER CENTER ONLY)
ALT: 49 U/L — ABNORMAL HIGH (ref 0–44)
AST: 31 U/L (ref 15–41)
Albumin: 3.6 g/dL (ref 3.5–5.0)
Alkaline Phosphatase: 77 U/L (ref 38–126)
Anion gap: 9 (ref 5–15)
BUN: 7 mg/dL (ref 6–20)
CO2: 26 mmol/L (ref 22–32)
Calcium: 9 mg/dL (ref 8.9–10.3)
Chloride: 108 mmol/L (ref 98–111)
Creatinine: 0.76 mg/dL (ref 0.44–1.00)
GFR, Est AFR Am: >60 mL/min (ref >60)
GFR, Estimated: >60 mL/min (ref >60)
Glucose, Bld: 74 mg/dL (ref 70–99)
Potassium: 3.7 mmol/L (ref 3.5–5.1)
Sodium: 143 mmol/L (ref 135–145)
Total Bilirubin: 1 mg/dL (ref 0.3–1.2)
Total Protein: 6.1 g/dL — ABNORMAL LOW (ref 6.5–8.1)

## 2019-02-02 LAB — CBC WITH DIFFERENTIAL (CANCER CENTER ONLY)
Abs Immature Granulocytes: 0.04 10*3/uL (ref 0.00–0.07)
Basophils Absolute: 0 10*3/uL (ref 0.0–0.1)
Basophils Relative: 0 %
Eosinophils Absolute: 0 10*3/uL (ref 0.0–0.5)
Eosinophils Relative: 0 %
HCT: 29.9 % — ABNORMAL LOW (ref 36.0–46.0)
Hemoglobin: 9.9 g/dL — ABNORMAL LOW (ref 12.0–15.0)
Immature Granulocytes: 1 %
Lymphocytes Relative: 17 %
Lymphs Abs: 0.8 10*3/uL (ref 0.7–4.0)
MCH: 30.2 pg (ref 26.0–34.0)
MCHC: 33.1 g/dL (ref 30.0–36.0)
MCV: 91.2 fL (ref 80.0–100.0)
Monocytes Absolute: 0.4 10*3/uL (ref 0.1–1.0)
Monocytes Relative: 8 %
Neutro Abs: 3.7 10*3/uL (ref 1.7–7.7)
Neutrophils Relative %: 74 %
Platelet Count: 162 10*3/uL (ref 150–400)
RBC: 3.28 MIL/uL — ABNORMAL LOW (ref 3.87–5.11)
RDW: 15.7 % — ABNORMAL HIGH (ref 11.5–15.5)
WBC Count: 5 10*3/uL (ref 4.0–10.5)
nRBC: 0 % (ref 0.0–0.2)

## 2019-02-02 LAB — VITAMIN D 25 HYDROXY (VIT D DEFICIENCY, FRACTURES): Vit D, 25-Hydroxy: 19.82 ng/mL — ABNORMAL LOW (ref 30–100)

## 2019-02-02 LAB — MAGNESIUM: Magnesium: 1.9 mg/dL (ref 1.7–2.4)

## 2019-02-02 MED ORDER — SODIUM CHLORIDE 0.9% FLUSH
10.0000 mL | Freq: Once | INTRAVENOUS | Status: AC
  Administered 2019-02-02: 10 mL
  Filled 2019-02-02: qty 10

## 2019-02-02 MED ORDER — LORATADINE 10 MG PO TABS
ORAL_TABLET | ORAL | Status: AC
  Filled 2019-02-02: qty 1

## 2019-02-02 MED ORDER — HEPARIN SOD (PORK) LOCK FLUSH 100 UNIT/ML IV SOLN
500.0000 [IU] | Freq: Once | INTRAVENOUS | Status: AC | PRN
  Administered 2019-02-02: 500 [IU]
  Filled 2019-02-02: qty 5

## 2019-02-02 MED ORDER — PALONOSETRON HCL INJECTION 0.25 MG/5ML
0.2500 mg | Freq: Once | INTRAVENOUS | Status: AC
  Administered 2019-02-02: 0.25 mg via INTRAVENOUS

## 2019-02-02 MED ORDER — SODIUM CHLORIDE 0.9 % IV SOLN
1.4000 mg/m2 | Freq: Once | INTRAVENOUS | Status: AC
  Administered 2019-02-02: 2.4 mg via INTRAVENOUS
  Filled 2019-02-02: qty 4.8

## 2019-02-02 MED ORDER — ACETAMINOPHEN 325 MG PO TABS
ORAL_TABLET | ORAL | Status: AC
  Filled 2019-02-02: qty 2

## 2019-02-02 MED ORDER — SODIUM CHLORIDE 0.9 % IV SOLN
Freq: Once | INTRAVENOUS | Status: AC
  Filled 2019-02-02: qty 250

## 2019-02-02 MED ORDER — SODIUM CHLORIDE 0.9% FLUSH
10.0000 mL | INTRAVENOUS | Status: DC | PRN
  Administered 2019-02-02: 10 mL
  Filled 2019-02-02: qty 10

## 2019-02-02 MED ORDER — TRASTUZUMAB-DKST CHEMO 150 MG IV SOLR
6.0000 mg/kg | Freq: Once | INTRAVENOUS | Status: AC
  Administered 2019-02-02: 378 mg via INTRAVENOUS
  Filled 2019-02-02: qty 18

## 2019-02-02 MED ORDER — LORATADINE 10 MG PO TABS
10.0000 mg | ORAL_TABLET | Freq: Every day | ORAL | Status: DC
  Administered 2019-02-02: 10 mg via ORAL

## 2019-02-02 MED ORDER — ACETAMINOPHEN 325 MG PO TABS
650.0000 mg | ORAL_TABLET | Freq: Once | ORAL | Status: AC
  Administered 2019-02-02: 650 mg via ORAL

## 2019-02-02 MED ORDER — DEXAMETHASONE SODIUM PHOSPHATE 10 MG/ML IJ SOLN
INTRAMUSCULAR | Status: AC
  Filled 2019-02-02: qty 1

## 2019-02-02 MED ORDER — PALONOSETRON HCL INJECTION 0.25 MG/5ML
INTRAVENOUS | Status: AC
  Filled 2019-02-02: qty 5

## 2019-02-02 MED ORDER — DEXAMETHASONE SODIUM PHOSPHATE 10 MG/ML IJ SOLN
5.0000 mg | Freq: Once | INTRAMUSCULAR | Status: AC
  Administered 2019-02-02: 11:00:00 5 mg via INTRAVENOUS

## 2019-02-02 NOTE — Patient Instructions (Signed)

## 2019-02-02 NOTE — Patient Instructions (Signed)
[facility] Discharge Instructions for Patients Receiving Chemotherapy  Today you received the following chemotherapy agents: Trastuzumab, Halaven  To help prevent nausea and vomiting after your treatment, we encourage you to take your nausea medication as directed.   If you develop nausea and vomiting that is not controlled by your nausea medication, call the clinic.   BELOW ARE SYMPTOMS THAT SHOULD BE REPORTED IMMEDIATELY:  *FEVER GREATER THAN 100.5 F  *CHILLS WITH OR WITHOUT FEVER  NAUSEA AND VOMITING THAT IS NOT CONTROLLED WITH YOUR NAUSEA MEDICATION  *UNUSUAL SHORTNESS OF BREATH  *UNUSUAL BRUISING OR BLEEDING  TENDERNESS IN MOUTH AND THROAT WITH OR WITHOUT PRESENCE OF ULCERS  *URINARY PROBLEMS  *BOWEL PROBLEMS  UNUSUAL RASH Items with * indicate a potential emergency and should be followed up as soon as possible.  Feel free to call the clinic should you have any questions or concerns. The clinic phone number is [phone].  Please show the [place] at check-in to the Emergency Department and triage nurse.

## 2019-02-03 ENCOUNTER — Telehealth: Payer: Self-pay | Admitting: Hematology

## 2019-02-03 LAB — CANCER ANTIGEN 27.29: CA 27.29: 42.4 U/mL — ABNORMAL HIGH (ref 0.0–38.6)

## 2019-02-03 NOTE — Telephone Encounter (Signed)
Scheduled appt per 1/21 los.  Patient will get an updated appt calendar at her next scheduled appt.

## 2019-02-08 ENCOUNTER — Inpatient Hospital Stay: Payer: Medicaid Other

## 2019-02-08 ENCOUNTER — Other Ambulatory Visit: Payer: Self-pay

## 2019-02-08 VITALS — BP 111/65 | HR 87 | Temp 98.7°F | Resp 16

## 2019-02-08 DIAGNOSIS — Z5112 Encounter for antineoplastic immunotherapy: Secondary | ICD-10-CM | POA: Diagnosis not present

## 2019-02-08 DIAGNOSIS — C7951 Secondary malignant neoplasm of bone: Secondary | ICD-10-CM

## 2019-02-08 DIAGNOSIS — Z95828 Presence of other vascular implants and grafts: Secondary | ICD-10-CM

## 2019-02-08 DIAGNOSIS — C50911 Malignant neoplasm of unspecified site of right female breast: Secondary | ICD-10-CM

## 2019-02-08 LAB — CMP (CANCER CENTER ONLY)
ALT: 45 U/L — ABNORMAL HIGH (ref 0–44)
AST: 29 U/L (ref 15–41)
Albumin: 3.9 g/dL (ref 3.5–5.0)
Alkaline Phosphatase: 61 U/L (ref 38–126)
Anion gap: 11 (ref 5–15)
BUN: 12 mg/dL (ref 6–20)
CO2: 27 mmol/L (ref 22–32)
Calcium: 9.3 mg/dL (ref 8.9–10.3)
Chloride: 105 mmol/L (ref 98–111)
Creatinine: 0.8 mg/dL (ref 0.44–1.00)
GFR, Est AFR Am: >60 mL/min (ref >60)
GFR, Estimated: >60 mL/min (ref >60)
Glucose, Bld: 100 mg/dL — ABNORMAL HIGH (ref 70–99)
Potassium: 3.6 mmol/L (ref 3.5–5.1)
Sodium: 143 mmol/L (ref 135–145)
Total Bilirubin: 1.5 mg/dL — ABNORMAL HIGH (ref 0.3–1.2)
Total Protein: 6.5 g/dL (ref 6.5–8.1)

## 2019-02-08 LAB — CBC WITH DIFFERENTIAL (CANCER CENTER ONLY)
Abs Immature Granulocytes: 0.07 10*3/uL (ref 0.00–0.07)
Basophils Absolute: 0.1 10*3/uL (ref 0.0–0.1)
Basophils Relative: 1 %
Eosinophils Absolute: 0 10*3/uL (ref 0.0–0.5)
Eosinophils Relative: 0 %
HCT: 29.9 % — ABNORMAL LOW (ref 36.0–46.0)
Hemoglobin: 10.1 g/dL — ABNORMAL LOW (ref 12.0–15.0)
Immature Granulocytes: 2 %
Lymphocytes Relative: 20 %
Lymphs Abs: 0.8 10*3/uL (ref 0.7–4.0)
MCH: 30.3 pg (ref 26.0–34.0)
MCHC: 33.8 g/dL (ref 30.0–36.0)
MCV: 89.8 fL (ref 80.0–100.0)
Monocytes Absolute: 0.1 10*3/uL (ref 0.1–1.0)
Monocytes Relative: 3 %
Neutro Abs: 3 10*3/uL (ref 1.7–7.7)
Neutrophils Relative %: 74 %
Platelet Count: 251 10*3/uL (ref 150–400)
RBC: 3.33 MIL/uL — ABNORMAL LOW (ref 3.87–5.11)
RDW: 15.4 % (ref 11.5–15.5)
WBC Count: 4.1 10*3/uL (ref 4.0–10.5)
nRBC: 0 % (ref 0.0–0.2)

## 2019-02-08 MED ORDER — SODIUM CHLORIDE 0.9 % IV SOLN
INTRAVENOUS | Status: DC
  Filled 2019-02-08: qty 250

## 2019-02-08 MED ORDER — SODIUM CHLORIDE 0.9 % IV SOLN
1.4000 mg/m2 | Freq: Once | INTRAVENOUS | Status: AC
  Administered 2019-02-08: 2.4 mg via INTRAVENOUS
  Filled 2019-02-08: qty 4.8

## 2019-02-08 MED ORDER — DEXAMETHASONE SODIUM PHOSPHATE 10 MG/ML IJ SOLN
INTRAMUSCULAR | Status: AC
  Filled 2019-02-08: qty 1

## 2019-02-08 MED ORDER — SODIUM CHLORIDE 0.9% FLUSH
10.0000 mL | INTRAVENOUS | Status: DC | PRN
  Administered 2019-02-08: 10 mL via INTRAVENOUS
  Filled 2019-02-08: qty 10

## 2019-02-08 MED ORDER — HEPARIN SOD (PORK) LOCK FLUSH 100 UNIT/ML IV SOLN
500.0000 [IU] | Freq: Once | INTRAVENOUS | Status: AC | PRN
  Administered 2019-02-08: 500 [IU]
  Filled 2019-02-08: qty 5

## 2019-02-08 MED ORDER — PALONOSETRON HCL INJECTION 0.25 MG/5ML
INTRAVENOUS | Status: AC
  Filled 2019-02-08: qty 5

## 2019-02-08 MED ORDER — PALONOSETRON HCL INJECTION 0.25 MG/5ML
0.2500 mg | Freq: Once | INTRAVENOUS | Status: AC
  Administered 2019-02-08: 0.25 mg via INTRAVENOUS

## 2019-02-08 MED ORDER — SODIUM CHLORIDE 0.9% FLUSH
10.0000 mL | INTRAVENOUS | Status: DC | PRN
  Administered 2019-02-08: 10 mL
  Filled 2019-02-08: qty 10

## 2019-02-08 MED ORDER — PEGFILGRASTIM 6 MG/0.6ML ~~LOC~~ PSKT
6.0000 mg | PREFILLED_SYRINGE | Freq: Once | SUBCUTANEOUS | Status: AC
  Administered 2019-02-08: 6 mg via SUBCUTANEOUS

## 2019-02-08 MED ORDER — DEXAMETHASONE SODIUM PHOSPHATE 10 MG/ML IJ SOLN
5.0000 mg | Freq: Once | INTRAMUSCULAR | Status: AC
  Administered 2019-02-08: 5 mg via INTRAVENOUS

## 2019-02-08 MED ORDER — PEGFILGRASTIM 6 MG/0.6ML ~~LOC~~ PSKT
PREFILLED_SYRINGE | SUBCUTANEOUS | Status: AC
  Filled 2019-02-08: qty 0.6

## 2019-02-08 NOTE — Patient Instructions (Signed)

## 2019-02-08 NOTE — Patient Instructions (Signed)
[facility] Discharge Instructions for Patients Receiving Chemotherapy  Today you received the following chemotherapy agents: halaven  To help prevent nausea and vomiting after your treatment, we encourage you to take your nausea medication as directed.    If you develop nausea and vomiting that is not controlled by your nausea medication, call the clinic.   BELOW ARE SYMPTOMS THAT SHOULD BE REPORTED IMMEDIATELY:  *FEVER GREATER THAN 100.5 F  *CHILLS WITH OR WITHOUT FEVER  NAUSEA AND VOMITING THAT IS NOT CONTROLLED WITH YOUR NAUSEA MEDICATION  *UNUSUAL SHORTNESS OF BREATH  *UNUSUAL BRUISING OR BLEEDING  TENDERNESS IN MOUTH AND THROAT WITH OR WITHOUT PRESENCE OF ULCERS  *URINARY PROBLEMS  *BOWEL PROBLEMS  UNUSUAL RASH Items with * indicate a potential emergency and should be followed up as soon as possible.  Feel free to call the clinic should you have any questions or concerns. The clinic phone number is [phone].  Please show the CHEMO ALERT CARD at check-in to the Emergency Department and triage nurse.   

## 2019-02-15 NOTE — Progress Notes (Signed)
[name]   [phone] Fax:([phone]   Clinic Follow up Note   Patient Care Team: [name], [name], [name] as PCP - General (Physician Assistant) [name], MD as Consulting Physician (Orthopedic Surgery)  Date of Service:  02/22/2019  CHIEF COMPLAINT: F/u ofmetastatic breast cancer  SUMMARY OF ONCOLOGIC HISTORY: Oncology History Overview Note   Cancer Staging No matching staging information was found for the patient.     [name], right ([place])  11/1997 Cancer Diagnosis   Patient had 1.4 cm poorly differentiated right breast carcinoma diagnosed in Nov 1999 at age 32, 1/7 axillary nodes involved, ER/PR and HER 2 positive. She had mastectomy with the 7 axillary node evaluation, 4 cycles of adriamycin/ cytoxan followed by taxotere, then five years of tamoxifen thru June 2005 (no herceptin in 1999). She had bilateral oophorectomy in May 2005. She was briefly on aromatase inhibitor after tamoxifen, but discontinued this herself due to poor tolerance.   04/04/2015 Genetic Testing   Negative genetic testing on the breast/ovarian cancer pnael.  The Breast/Ovarian gene panel offered by [facility] includes sequencing and rearrangement analysis for the following 20 genes:  ATM, BARD1, BRCA1, BRCA2, BRIP1, CDH1, CHEK2, EPCAM, FANCC, MLH1, MSH2, MSH6, NBN, PALB2, PMS2, PTEN, RAD51C, RAD51D, TP53, and XRCC2.   06/26/2015 Pathology Results   Initial Path Report Bone, curettage, Left femur met, intramedullary subtroch tissue - METASTATIC ADENOCARCINOMA.  Estrogen Receptor: 95%, POSITIVE, [name] STAINING INTENSITY Progesterone Receptor: 2%, POSITIVE, [name]  HER2 (+), IHC 3+   06/26/2015 Surgery   Biopsy of metastatic tissue and stabilization with Affixus trochanteric nail, proximal and distal interlock of left femur by Dr. [name]    06/28/2015 Imaging   CT Chest w/ Contrast 1. Extensive right internal mammary chain  lymphadenopathy consistent with metastatic breast cancer. 2. Lytic metastases involving the posterior elements at T1 with probable nondisplaced pathologic fracture of the spinous process. 3. No other evidence of thoracic metastatic disease. 4. Trace bilateral pleural effusions with associated bibasilar atelectasis. 5. Indeterminate right thyroid nodule, not previously imaged. This could be evaluated with thyroid ultrasound as clinically warranted.   07/01/2015 Imaging   Bone Scan 1. Increased activity noted throughout the left femur. Although metastatic disease cannot be excluded, these changes are most likely secondary to prior surgery. 2. No other focal abnormalities identified to suggest metastatic disease.   07/12/2015 - 07/26/2015 Radiation Therapy   Left femur, 30 Gy in 10 fractions by Dr. Sondra Come    07/14/2015 Initial Diagnosis   [name], right ([place])   07/19/2015 Imaging   Initial PET Scan 1. Severe multifocal osseous metastatic disease, much greater than anticipated based on prior imaging. 2. Multifocal nodal metastases involving the right internal mammary, prevascular and subpectoral lymph nodes. No axillary pulmonary involvement identified. 3. No distant extra osseous metastases.   07/30/2015 - 11/29/2015 Chemotherapy   Docetaxel, Herceptin and Perjeta every 3 weeks X6 cycles, pt tolerated moderately well, restaging scan showed excellent response    09/18/2015 Imaging   CT Head w/wo Contrast 1. No acute intracranial abnormality or significant interval change. 2. Stable minimal periventricular white matter hypoattenuation on the right. This may reflect a remote ischemic injury. 3. No evidence for metastatic disease to the brain. 4. No focal soft tissue lesion to explain the patient's tenderness.   10/30/2015 Imaging   CT Abdomen Pelvis w/ Contrast 1. Few mildly prominent fluid-filled loops of small bowel scattered throughout the abdomen, with  intraluminal fluid density within the distal colon. Given the provided history, findings are suggestive of acute enteritis/diarrheal illness. 2. No other acute intra-abdominal or pelvic process identified. 3. Widespread osseous metastatic disease, better evaluated on most recent PET-CT from 07/19/2015. No pathologic fracture or other complication. 4. 11 mm hypodensity within the left kidney. This lesion measures intermediate density, and is indeterminate. While this lesion is similar in size relative to recent studies, this is increased in size relative to prior study from 2012. Further evaluation with dedicated renal mass protocol CT and/or MRI is recommended for complete characterization.   12/2015 - 05/14/2016 Chemotherapy   Maintenance Herceptin and pejeta every 3 weeks stopped on 05/14/16 due to disease progression   12/25/2015 Imaging   Restaging PET Scan 1. Markedly improved skeletal activity, with only several faint foci of residual accentuated metabolic activity, but resolution of the vast majority of the previously extensive osseous metastatic disease. 2. Resolution of the prior right internal mammary and right prevascular lymph nodes. 3. Residual subcutaneous edema and edema along fascia planes in the left upper thigh. This remains somewhat more than I would expect for placement of an IM nail 6 weeks ago. If there is leg swelling further down, consider Doppler venous ultrasound to rule out left lower extremity DVT. I do not perceive an obvious difference in density between the pelvic and common femoral veins on the noncontrast CT data. 4. Chronic right maxillary and right sphenoid sinusitis.   01/16/2016 - 10/20/2017 Anti-estrogen oral therapy   Letrozole 2.5 mg daily. She was switched to exemestane due to disease progression on 02/08/17. Stopped on 10/20/2017 when treatment changed to chemo    03/01/2016 Miscellaneous   Patient presented to ED following a fall; she reports she landed on her  back and is now experiencing back pain. The patient was evaluated and discharge home same day with pain control.   05/01/2016 Imaging   CT CAP IMPRESSION: 1. Stable exam.  No new or progressive disease identified. 2. No evidence for residual or recurrent adenopathy within the chest. 3. Stable bone metastasis.   05/01/2016 Imaging   BONE SCAN IMPRESSION: 1. Small focal uptake involving the anterior rib ends of the left second and right fourth ribs, new since the prior bone scan. The location of this uptake is more suggestive of a traumatic or inflammatory etiology as opposed to metastatic disease. No other evidence of metastatic disease. 2. There are other areas of stable uptake which are most likely degenerative/reactive, including right shoulder and cervical spine uptake and uptake along the right femur adjacent to the ORIF hardware.   05/20/2016 Imaging   NM PET Skull to Thigh IMPRESSION: 1. There multiple metastatic foci in the bony pelvis, including some new abnormal foci compare to the prior PET-CT, compatible with mildly progressive bony metastatic disease. Also, a left T11 vertebral hypermetabolic metastatic lesion is observed, new compared to the prior exam. 2. No active extraosseous malignancy is currently identified. 3. Right anterior fourth rib healing fracture, appears be   06/04/2016 - 01/08/2017 Chemotherapy   Second line chemotherapy Kadcyla every 3 weeks started on 06/04/16 and stopped on 01/08/17 due to mixed response.     10/05/2016 Imaging   CT CAP and Bone Scan:  Bones: left intramedullary rod and left femoral head neck screw in place. In the proximal diaphysis of the left femur there is cortical thickening and irregularity. No lytic or blastic bone lesions. No fracture or vertebral endplate destruction.   No definite evidence of recurrent  disease in the chest, abdomen, or pelvis.   10/28/2016 Imaging   CT A/P IMPRESSION: No acute process demonstrated in  the abdomen or pelvis. No evidence of bowel obstruction or inflammation.   11/04/2016 Imaging   DG foot complete left: IMPRESSION: No fracture or dislocation.  No soft tissue abnormality   11/23/2016 Imaging   CT RIGHT FEMUR IMPRESSION: Negative CT scan of the right thigh.  No visible metastatic disease.  CT LEFT FEMUR IMPRESSION: 1. Postsurgical changes related to prior cephalomedullary rod fixation of the left femur with linear lucency along the anterolateral proximal femoral cortex at level of the lesser trochanter, suspicious for nondisplaced fracture. 2. Slightly permeative appearance of the proximal left femoral cortex at the site of known prior osseous metastases is largely unchanged. 3. Unchanged patchy lucency and sclerosis along the anterior acetabulum, which appears to correspond with an area of faint uptake on the prior PET-CT, suspicious for osseous metastasis. These results will be called to the ordering clinician or representative by the Radiologist Assistant, and communication documented in the PACS or zVision Dashboard.     01/27/2017 Imaging   IMPRESSION: 1. Mixed response to osseous metastasis. Some hypermetabolic lesions are new and increasingly hypermetabolic, while another index lesion is decreasingly hypermetabolic. 2. No evidence of hypermetabolic soft tissue metastasis.    02/08/2017 - 10/19/2017 Antibody Plan   -Herceptin every 3 week since 02/08/17 -Oral Lanpantinib 1548m and decreased to 5027mdue to diarrhea and depression. Due to disease progression she swithced to Verzenio 15032mn 05/04/17. She did not toelrate well so she was switched to Ibrance 100m35m 05/31/17. Due to her neutropenia, Ibrance dose reduced to 75 mg daily, 3 weeks on, one-week off, stopped on 10/20/2017 due to disease progression.    04/30/2017 PET scan   IMPRESSION: 1. Primarily increase in hypermetabolism of osseous metastasis. An isolated right acetabular lesion is  decreased in hypermetabolism since the prior exam. 2. No evidence of hypermetabolic soft tissue metastasis. 3. Similar non FDG avid right-sided thyroid nodule, favoring a benign etiology. Recommend attention on follow-up.   08/30/2017 PET scan   08/30/2017 PET Scan  IMPRESSION: 1. Worsening osseous metastatic disease. 2. Focal hypermetabolism in the central spinal canal the L1 level, similar to the prior exam. Metastatic implant cannot be excluded. 3. Slight enlargement of a right thyroid nodule which now appears more cystic in character. Consider further evaluation with thyroid ultrasound. If patient is clinically hyperthyroid, consider nuclear medicine thyroid uptake and scan   08/30/2017 Progression   08/30/2017 PET Scan  IMPRESSION: 1. Worsening osseous metastatic disease. 2. Focal hypermetabolism in the central spinal canal the L1 level, similar to the prior exam. Metastatic implant cannot be excluded. 3. Slight enlargement of a right thyroid nodule which now appears more cystic in character. Consider further evaluation with thyroid ultrasound. If patient is clinically hyperthyroid, consider nuclear medicine thyroid uptake and scan   09/29/2017 - 10/11/2017 Radiation Therapy   Pt received 27 Gy in 9 fractions directed at the areas of significant uptake noted on recent bone scan the right pelvis and right proximal femur, with Dr, KinaSondra Come10/09/2017 - 04/07/2018 Chemotherapy   -Herceptin every 3 weeks starting 02/08/17, perjeta added on 10/20/2017  -weekly Taxol started on 10/21/2017. Will reduce Taxol to 2 weeks on, 1 week off starting on 02/04/17.  Stoped due to mild disease progression   10/27/2017 Imaging   10/27/2017 CT AP IMPRESSION: 1. No acute abnormality. No evidence of bowel obstruction or acute bowel  inflammation. Normal appendix. 2. Stable patchy sclerotic osseous metastases throughout the visualized skeleton. No new or progressive metastatic disease in the abdomen  or pelvis.   01/11/2018 Imaging   Whole Body Bone Scan 01/11/18  IMPRESSION: 1. Multifocal osseous metastases as described. 2. Right scapular metastasis. 3. Right superior thoracic spinous process. 4. Right sacral ala and right L5 metastases. 5. Right acetabular metastasis. 6. Right proximal femur metastasis. 7. Diffuse metastases throughout the left femur. 8. Anterior right fourth rib metastasis.   01/11/2018 Imaging   CT CAP W Contrast 01/11/18  IMPRESSION: 1. Similar-appearing osseous metastatic disease. 2. No evidence for progressed metastatic disease in the chest, abdomen or pelvis.   03/30/2018 Imaging   Bone Scan 03/30/18  IMPRESSION: Stable multifocal bony metastatic disease.   03/30/2018 Imaging    CT CAP W Contrast 03/30/18 IMPRESSION: 1. New mild contiguous wall thickening with associated mucosal hyperenhancement throughout the left colon and rectum, suggesting nonspecific infectious or inflammatory proctocolitis. Differential includes C diff colitis. 2. Stable faint patchy sclerosis in the iliac bones and lumbar vertebral bodies. No new or progressive osseous metastatic disease by CT. Please note that the osseous lesions are relatively occult by CT and would be better evaluated by PET-CT, as clinically warranted. 3. No evidence of extra osseous metastatic disease.  These results will be called to the ordering clinician or representative by the Radiologist Assistant, and communication documented in the PACS or zVision Dashboard.   04/15/2018 - 06/16/2018 Chemotherapy   Enhertu q3weeks starting 04/15/18. Stopped Enhertu after cycle 3 on 05/27/18 due to poor toleration due to poor toleration with anorexia, weight loss, N&V, diarrhea   04/21/2018 PET scan   PET 04/21/18  IMPRESSION: 1. Overall marked interval improvement in bony hypermetabolic disease. All of the previously identified hypermetabolic osseous metastases have decreased in hypermetabolism in the interval.  There is a new tiny focus of hypermetabolism in the right aspect of the L3 vertebral body that appears to correspond to a new 8 mm lucency, raising concern for new metastatic disease. Close attention on follow-up recommended. 2. Focal hypermetabolism in the central spinal canal at the level of L1 previously has resolved. 3. No hypermetabolic soft tissue disease on today's study. 4. Right thyroid nodule seen previously has decreased in the interval.    06/16/2018 Imaging   CT LUMBAR SPINE WO CONTRAST 06/16/18 IMPRESSION: 1. Transitional lumbosacral anatomy. Correlation with radiographs is recommended prior to any operative intervention. 2. Suspicion of a right L3 vertebral body metastasis on the April PET-CT is not evident by CT. Stable degenerative versus metastatic lesion at the right L5 superior endplate. Small but increased right iliac bone metastasis since March. Lumbar MRI would best characterize lumbar metastatic disease (without contrast if necessary). 3. Suspect L2-L3 left subarticular segment disc herniation. Query left L2 radiculitis. 4. Stable appearance of rightward L4-L5 disc degeneration which could affect the right foramen and right lateral recess.   06/17/2018 - 11/16/2018 Chemotherapy   Restart Taxol 2 weeks on/1 week off with Herceptin and Perjeta q3weeks on 06/17/18. Perjeta was stopped after C5 due to diarrhea. Stopped Taxol after 11/16/18 due to disease progression to L3.    07/06/2018 PET scan   PET  IMPRESSION: 1. Near total resolution of hypermetabolic activity associated with skeletal metastasis. Very mild activity associated with the LEFT iliac bone which is similar to background blood pool activity. 2. No new or progressive disease.   11/21/2018 PET scan   IMPRESSION: 1. New focal hypermetabolic lesion eccentric to the right  in the L3 vertebral body, maximum SUV 6.1 compatible with a new focus of active malignancy. 2. Very low-grade activity similar to  blood pool in previous existing mildly sclerotic metastatic lesions including the left iliac bone lesion. Some of the mildly sclerotic lesions are not hypermetabolic compatible with effectively treated bony metastatic disease. 3. No hypermetabolic soft tissue lesions are identified.     11/30/2018 -  Chemotherapy   Continue Herceptin q3weeks    12/12/2018 - 12/17/2018 Chemotherapy   Tucatinib (TUKYSA) 6 tabs BID as a next line therapy starting 12/12/18. Stopped 12/17/18 duet o N&V, poor appetite and HA    12/21/2018 -  Chemotherapy   IV Eribulin on day 1, 8 every 21 days starting 12/21/18      CURRENT THERAPY:  -Continue Herceptin q3weeks -IVEribulin on day 1, 8 every 21 days starting 12/21/18. Onpro patch added C2D8.  INTERVAL HISTORY:  [name] is here for a follow up and treatment. She presents to the clinic with her interpretor. She notes she is doing well. She notes she has been moving furniture at home and had fell at home. She had a right arm bruise but this has resolved. She only has soreness. She notes she is tolerating chemo well. She denies nausea and is able to eat adequately. She notes only 1-2 days of fair appetite.  She notes she only had 1 episode of headache yesterday. She notes she is currently taking oxycodone '5mg'$  2 tab q8 hours and this is only since her fall. Before her fall she was taking 1-2 tabs a day. She takes at least 2 potassium daily.     REVIEW OF SYSTEMS:   Constitutional: Denies fevers, chills or abnormal weight loss Eyes: Denies blurriness of vision Ears, nose, mouth, throat, and face: Denies mucositis or sore throat Respiratory: Denies cough, dyspnea or wheezes Cardiovascular: Denies palpitation, chest discomfort or lower extremity swelling Gastrointestinal:  Denies nausea, heartburn or change in bowel habits Skin: Denies abnormal skin rashes MSK: (+) right body soreness, mild  Lymphatics: Denies new lymphadenopathy or easy  bruising Neurological:Denies numbness, tingling or new weaknesses Behavioral/Psych: Mood is stable, no new changes  All other systems were reviewed with the patient and are negative.  MEDICAL HISTORY:  Past Medical History:  Diagnosis Date  . Abnormal Pap smear   . Anxiety   . Breast cancer ([place])   . Cancer ([place]) 1999   breast-s/p mastectomy, chemo, rad  . CIN I (cervical intraepithelial neoplasia I) 2003   by colpo  . Congenital deafness   . Deaf   . Depression   . Port-A-Cath in place 2017   for chemotherapy  . Radiation 07/12/15-07/26/15   left femur 30 Gy  . S/P bilateral oophorectomy     SURGICAL HISTORY: Past Surgical History:  Procedure Laterality Date  . ABDOMINAL HYSTERECTOMY    . INTRAMEDULLARY (IM) NAIL INTERTROCHANTERIC Left 06/26/2015   Procedure: LEFT BIOMET LONG AFFIXS NAIL;  Surgeon: [name], MD;  Location: [facility];  Service: Orthopedics;  Laterality: Left;  . IR GENERIC HISTORICAL  08/06/2015   IR US GUIDE VASC ACCESS LEFT 08/06/2015 Aletta Edouard, MD [facility]  . IR GENERIC HISTORICAL  08/06/2015   IR FLUORO GUIDE CV LINE LEFT 08/06/2015 Aletta Edouard, MD [facility]  . IR GENERIC HISTORICAL  03/05/2016   IR CV LINE INJECTION 03/05/2016 Markus Daft, MD [facility]  . LEFT HEART CATH AND CORONARY ANGIOGRAPHY N/A 12/13/2017   Procedure: LEFT HEART CATH AND CORONARY ANGIOGRAPHY;  Surgeon: Leonie Man, MD;  Location: [facility];  Service: Cardiovascular;  Laterality: N/A;  . MASTECTOMY     right breast  . MASTECTOMY    . OVARIAN CYST REMOVAL    . RADIOLOGY WITH ANESTHESIA Left 06/25/2015   Procedure: MRI OF LEFT HIP WITH OR WITHOUT CONTRAST;  Surgeon: Medication Radiologist, MD;  Location: [facility];  Service: Radiology;  Laterality: Left;  DR. MCINTYRE/MRI  . TUBAL LIGATION      I have reviewed the social history and family history with the patient and they are unchanged from previous note.  ALLERGIES:  is allergic to promethazine hcl;  chlorhexidine; and diphenhydramine hcl.  MEDICATIONS:  Current Outpatient Medications  Medication Sig Dispense Refill  . albuterol (PROAIR HFA) 108 (90 Base) MCG/ACT inhaler Inhale 2 puffs into the lungs every 6 (six) hours as needed for wheezing or shortness of breath.    Marland Kitchen albuterol (PROVENTIL HFA;VENTOLIN HFA) 108 (90 Base) MCG/ACT inhaler Inhale 2 puffs into the lungs every 6 (six) hours as needed for wheezing or shortness of breath. 1 Inhaler 0  . ALPRAZolam (XANAX) 0.25 MG tablet Take 1 tablet (0.25 mg total) by mouth 2 (two) times daily as needed for anxiety. 30 tablet 0  . diphenoxylate-atropine (LOMOTIL) 2.5-0.025 MG tablet Take 1-2 tablets by mouth 4 (four) times daily as needed for diarrhea or loose stools. 30 tablet 1  . lidocaine-prilocaine (EMLA) cream Apply 1 application topically as needed. Apply to Porta-Cath 1-2 hours prior to access as directed. (Patient taking differently: Apply 1 application topically as needed (To Porta-Cath 1-2 hours prior to access as directed). ) 90 g 2  . magnesium oxide (MAG-OX) 400 (241.3 Mg) MG tablet Take 1 tablet (400 mg total) by mouth daily. 30 tablet 3  . methocarbamol (ROBAXIN) 500 MG tablet Take 1 tablet (500 mg total) by mouth 2 (two) times daily. (Patient taking differently: Take 500 mg by mouth 2 (two) times daily as needed for muscle spasms. ) 20 tablet 0  . omeprazole (PRILOSEC) 20 MG capsule Take 1 capsule (20 mg total) by mouth daily. 30 capsule 3  . ondansetron (ZOFRAN) 8 MG tablet Take 1 tablet (8 mg total) by mouth every 8 (eight) hours as needed for nausea or vomiting. 45 tablet 1  . oxyCODONE (ROXICODONE) 5 MG immediate release tablet Take 1 tablet (5 mg total) by mouth every 8 (eight) hours as needed for severe pain. 60 tablet 0  . potassium chloride (KLOR-CON) 10 MEQ tablet Take 3 tablets (30 mEq total) by mouth daily. 90 tablet 0  . prochlorperazine (COMPAZINE) 10 MG tablet Take 1 tablet (10 mg total) by mouth 2 (two) times daily as  needed for nausea or vomiting. 10 tablet 0  . zolpidem (AMBIEN) 5 MG tablet Take 1-2 tablets (5-10 mg total) by mouth at bedtime as needed. 60 tablet 0   No current facility-administered medications for this visit.   Facility-Administered Medications Ordered in Other Visits  Medication Dose Route Frequency Provider Last Rate Last Admin  . potassium chloride SA (K-DUR) CR tablet 40 mEq  40 mEq Oral Once Truitt Merle, MD      . sodium chloride 0.9 % 1,000 mL with potassium chloride 10 mEq infusion   Intravenous Continuous Gordy Levan, MD   Stopped at 09/10/15 1653  . sodium chloride 0.9 % injection 10 mL  10 mL Intravenous PRN Livesay, Lennis P, MD        PHYSICAL EXAMINATION: ECOG PERFORMANCE STATUS: 1 -  Symptomatic but completely ambulatory  Vitals:   02/22/19 0835  BP: 118/66  Pulse: 75  Resp: 18  Temp: 98.5 F (36.9 C)  SpO2: 98%   Filed Weights   02/22/19 0835  Weight: 138 lb 6.4 oz (62.8 kg)    GENERAL:alert, no distress and comfortable SKIN: skin color, texture, turgor are normal, no rashes or significant lesions EYES: normal, Conjunctiva are pink and non-injected, sclera clear  NECK: supple, thyroid normal size, non-tender, without nodularity LYMPH:  no palpable lymphadenopathy in the cervical, axillary  LUNGS: clear to auscultation and percussion with normal breathing effort HEART: regular rate & rhythm and no murmurs and no lower extremity edema ABDOMEN:abdomen soft, non-tender and normal bowel sounds Musculoskeletal:no cyanosis of digits and no clubbing  NEURO: alert & oriented x 3 with fluent speech, no focal motor/sensory deficits  LABORATORY DATA:  I have reviewed the data as listed CBC Latest Ref Rng & Units 02/22/2019 02/08/2019 02/02/2019  WBC 4.0 - 10.5 K/uL 4.9 4.1 5.0  Hemoglobin 12.0 - 15.0 g/dL 10.0(L) 10.1(L) 9.9(L)  Hematocrit 36.0 - 46.0 % 30.6(L) 29.9(L) 29.9(L)  Platelets 150 - 400 K/uL 140(L) 251 162     CMP Latest Ref Rng & Units 02/08/2019  02/02/2019 01/19/2019  Glucose 70 - 99 mg/dL 100(H) 74 118(H)  BUN 6 - 20 mg/dL '12 7 9  '$ Creatinine 0.44 - 1.00 mg/dL 0.80 0.76 0.81  Sodium 135 - 145 mmol/L 143 143 141  Potassium 3.5 - 5.1 mmol/L 3.6 3.7 3.2(L)  Chloride 98 - 111 mmol/L 105 108 106  CO2 22 - 32 mmol/L '27 26 24  '$ Calcium 8.9 - 10.3 mg/dL 9.3 9.0 8.7(L)  Total Protein 6.5 - 8.1 g/dL 6.5 6.1(L) 6.3(L)  Total Bilirubin 0.3 - 1.2 mg/dL 1.5(H) 1.0 1.2  Alkaline Phos 38 - 126 U/L 61 77 54  AST 15 - 41 U/L '29 31 26  '$ ALT 0 - 44 U/L 45(H) 49(H) 48(H)      RADIOGRAPHIC STUDIES: I have personally reviewed the radiological images as listed and agreed with the findings in the report. No results found.   ASSESSMENT & PLAN:  SEREN CHALOUX is a 54 y.o. 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 She had right mastectomy, 4 cycles of AC-T and 5 years of Tamoxifen/AI.  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 She has received Target RT. She had excellent response to First-line Docetaxel/Herceptin/Perjeta and AI but progressed on maintenance therapy. She had mixed response on second line Kadcyla, progressed on third-line Ibrance/Herceptin and poorly tolerated Enhertu.  -She triedTucatinib (TUKYSA)with low doseon 12/12/18 however due to N&V, poor appetite and HA she stopped on 12/17/18 after trying twice. -I started her on next lineiv chemo Eribulin on day 1, 8, every 21 days on 12/21/18, along with Herceptin every 3 weeks.  -S/p C3 she continues to tolerate Eribulin and Herceptin well and maintain appetite and energy. Labs reviewed, CBC WNL except mild anemia. Overall adequate to proceed with Herceptin and C4D1 Eribulin today.  -Will proceed with PET scan in the next few weeks. Will repeat Echo this month as well.  -F/u in 3 weeks    2. N&V  -Secondary to chemo  -Continue  prophylacticCompazineor Zofran.I offeredsublingual Zofran,she declined for now. -Aloxi was added with C2 Erbilin.  -Overall resolved lately on Eribulin  3.Body pain, secondary to bone mets, Hypocalcemia,  leg cramps  -Since her cancer diagnosis,she has had b/l hip, shoulder and chest painat different time points, likely related to her bone mets -She declined Gabapentin given risk of suicidal thoughts. -She is being treated with Zometa q69month. She does have hypocalcemia with leg cramps secondary to Zometa.  -She will continue oral calcium and Vit D and Prilosec for Acid Reflux.  -Current pain includes, lower back, left upper leg and recently her right leg and shoulder. She has increased use to 5-'10mg'$  1-2 times a day.  -Since recent fall and soreness since she increased oxycodone '5mg'$  to 1-2 tabs TID. I refilled today (02/22/19).   4.Congenital deafness  5.Mild anemia, secondary to chemotherapy and malignancy -She will start with oral iron daily for 1-2 months and then stop. She will also start daily women's vitamin. -Stable mild anemia   6.Hypokalemia, Diarrhea -She developed diarrhea s/p cycle 3 Perjeta.Has been d/c -Istarted her onMag '400mg'$  daily and Oral potassium 141m2-3 times a day. If she cannot take 3 tabs I encouraged her to increase potassium in diet.   7.Goal of care discussion -she understand her goal of care is palliative to prolong her life -she is full code for now  8.Insomnia -She cannot take Benadryl due to allergy reaction. She has tried trazodone with no relief.  -She is on Xanax and Ambien 1-2 tabs nightly.I advised her to not take them at the same time.   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 She declined using ice bags using infusion -stable. Will watch on chemo.    PLAN: -I refilled Oxycodone today   -Labs reviewed and adequate to proceed with Herceptin and C4D1Eribulintoday -Eribulin next week, with Onpro patch on day 8   -Lab, flush, and Eribulinin 3 and 4 weeks  -F/u in 3 weeks with lab, flush, Echo, PET scan a few days before.    No problem-specific Assessment & Plan notes found for this encounter.   Orders Placed This Encounter  Procedures  . NM PET Image Restag (PS) Skull Base To Thigh    Standing Status:   Future    Standing Expiration Date:   02/22/2020    Order Specific Question:   If indicated for the ordered procedure, I authorize the administration of a radiopharmaceutical per Radiology protocol    Answer:   Yes    Order Specific Question:   Is the patient pregnant?    Answer:   No    Order Specific Question:   Radiology Contrast Protocol - do NOT remove file path    Answer:   [email or web address]  . ECHOCARDIOGRAM COMPLETE    Standing Status:   Future    Standing Expiration Date:   05/21/2020    Order Specific Question:   Where should this test be performed    Answer:   [facility]  Order Specific Question:   Perflutren DEFINITY (image enhancing agent) should be administered unless hypersensitivity or allergy exist    Answer:   Administer Perflutren    Order Specific Question:   Is a special reader required? (athlete or structural heart)    Answer:   No    Order Specific Question:   Reason for exam-Echo    Answer:   Chemo  V67.2 / Z09   All questions were answered. The patient knows to call the clinic with any problems, questions or concerns. No barriers to learning was detected. The total time spent in the appointment was 30 minutes.     YaTruitt MerleMD 02/22/2019  Oneal Deputy, am acting as scribe for Truitt Merle, MD.   I have reviewed the above documentation for accuracy and completeness, and I agree with the above.

## 2019-02-22 ENCOUNTER — Inpatient Hospital Stay: Payer: Medicaid Other

## 2019-02-22 ENCOUNTER — Other Ambulatory Visit: Payer: Self-pay

## 2019-02-22 ENCOUNTER — Inpatient Hospital Stay: Payer: Medicaid Other | Attending: Hematology | Admitting: Hematology

## 2019-02-22 ENCOUNTER — Encounter: Payer: Self-pay | Admitting: Hematology

## 2019-02-22 VITALS — BP 118/66 | HR 75 | Temp 98.5°F | Resp 18 | Ht 65.0 in | Wt 138.4 lb

## 2019-02-22 DIAGNOSIS — G4700 Insomnia, unspecified: Secondary | ICD-10-CM | POA: Diagnosis not present

## 2019-02-22 DIAGNOSIS — C7951 Secondary malignant neoplasm of bone: Secondary | ICD-10-CM

## 2019-02-22 DIAGNOSIS — H905 Unspecified sensorineural hearing loss: Secondary | ICD-10-CM | POA: Diagnosis not present

## 2019-02-22 DIAGNOSIS — Z95828 Presence of other vascular implants and grafts: Secondary | ICD-10-CM

## 2019-02-22 DIAGNOSIS — Z170 Estrogen receptor positive status [ER+]: Secondary | ICD-10-CM | POA: Insufficient documentation

## 2019-02-22 DIAGNOSIS — Z5111 Encounter for antineoplastic chemotherapy: Secondary | ICD-10-CM | POA: Diagnosis present

## 2019-02-22 DIAGNOSIS — Z5112 Encounter for antineoplastic immunotherapy: Secondary | ICD-10-CM | POA: Diagnosis present

## 2019-02-22 DIAGNOSIS — D6481 Anemia due to antineoplastic chemotherapy: Secondary | ICD-10-CM | POA: Insufficient documentation

## 2019-02-22 DIAGNOSIS — Z452 Encounter for adjustment and management of vascular access device: Secondary | ICD-10-CM | POA: Diagnosis not present

## 2019-02-22 DIAGNOSIS — E876 Hypokalemia: Secondary | ICD-10-CM | POA: Insufficient documentation

## 2019-02-22 DIAGNOSIS — C50911 Malignant neoplasm of unspecified site of right female breast: Secondary | ICD-10-CM

## 2019-02-22 LAB — CBC WITH DIFFERENTIAL (CANCER CENTER ONLY)
Abs Immature Granulocytes: 0.03 10*3/uL (ref 0.00–0.07)
Basophils Absolute: 0 10*3/uL (ref 0.0–0.1)
Basophils Relative: 0 %
Eosinophils Absolute: 0 10*3/uL (ref 0.0–0.5)
Eosinophils Relative: 0 %
HCT: 30.6 % — ABNORMAL LOW (ref 36.0–46.0)
Hemoglobin: 10 g/dL — ABNORMAL LOW (ref 12.0–15.0)
Immature Granulocytes: 1 %
Lymphocytes Relative: 20 %
Lymphs Abs: 1 10*3/uL (ref 0.7–4.0)
MCH: 30.4 pg (ref 26.0–34.0)
MCHC: 32.7 g/dL (ref 30.0–36.0)
MCV: 93 fL (ref 80.0–100.0)
Monocytes Absolute: 0.4 10*3/uL (ref 0.1–1.0)
Monocytes Relative: 7 %
Neutro Abs: 3.6 10*3/uL (ref 1.7–7.7)
Neutrophils Relative %: 72 %
Platelet Count: 140 10*3/uL — ABNORMAL LOW (ref 150–400)
RBC: 3.29 MIL/uL — ABNORMAL LOW (ref 3.87–5.11)
RDW: 17 % — ABNORMAL HIGH (ref 11.5–15.5)
WBC Count: 4.9 10*3/uL (ref 4.0–10.5)
nRBC: 0 % (ref 0.0–0.2)

## 2019-02-22 LAB — CMP (CANCER CENTER ONLY)
ALT: 40 U/L (ref 0–44)
AST: 26 U/L (ref 15–41)
Albumin: 3.7 g/dL (ref 3.5–5.0)
Alkaline Phosphatase: 80 U/L (ref 38–126)
Anion gap: 10 (ref 5–15)
BUN: 8 mg/dL (ref 6–20)
CO2: 26 mmol/L (ref 22–32)
Calcium: 9 mg/dL (ref 8.9–10.3)
Chloride: 108 mmol/L (ref 98–111)
Creatinine: 0.81 mg/dL (ref 0.44–1.00)
GFR, Est AFR Am: >60 mL/min (ref >60)
GFR, Estimated: >60 mL/min (ref >60)
Glucose, Bld: 89 mg/dL (ref 70–99)
Potassium: 3.6 mmol/L (ref 3.5–5.1)
Sodium: 144 mmol/L (ref 135–145)
Total Bilirubin: 1.2 mg/dL (ref 0.3–1.2)
Total Protein: 6.2 g/dL — ABNORMAL LOW (ref 6.5–8.1)

## 2019-02-22 LAB — MAGNESIUM: Magnesium: 1.8 mg/dL (ref 1.7–2.4)

## 2019-02-22 MED ORDER — TRASTUZUMAB-DKST CHEMO 150 MG IV SOLR
6.0000 mg/kg | Freq: Once | INTRAVENOUS | Status: AC
  Administered 2019-02-22: 378 mg via INTRAVENOUS
  Filled 2019-02-22: qty 18

## 2019-02-22 MED ORDER — LORATADINE 10 MG PO TABS
10.0000 mg | ORAL_TABLET | Freq: Every day | ORAL | Status: DC
  Administered 2019-02-22: 10 mg via ORAL

## 2019-02-22 MED ORDER — ACETAMINOPHEN 325 MG PO TABS
ORAL_TABLET | ORAL | Status: AC
  Filled 2019-02-22: qty 2

## 2019-02-22 MED ORDER — PALONOSETRON HCL INJECTION 0.25 MG/5ML
INTRAVENOUS | Status: AC
  Filled 2019-02-22: qty 5

## 2019-02-22 MED ORDER — OXYCODONE HCL 5 MG PO TABS: 5.0000 mg | ORAL_TABLET | Freq: Three times a day (TID) | ORAL | 0 refills | Status: DC | PRN

## 2019-02-22 MED ORDER — LORATADINE 10 MG PO TABS
ORAL_TABLET | ORAL | Status: AC
  Filled 2019-02-22: qty 1

## 2019-02-22 MED ORDER — DEXAMETHASONE SODIUM PHOSPHATE 10 MG/ML IJ SOLN
5.0000 mg | Freq: Once | INTRAMUSCULAR | Status: AC
  Administered 2019-02-22: 5 mg via INTRAVENOUS

## 2019-02-22 MED ORDER — ACETAMINOPHEN 325 MG PO TABS
650.0000 mg | ORAL_TABLET | Freq: Once | ORAL | Status: AC
  Administered 2019-02-22: 650 mg via ORAL

## 2019-02-22 MED ORDER — PALONOSETRON HCL INJECTION 0.25 MG/5ML
0.2500 mg | Freq: Once | INTRAVENOUS | Status: AC
  Administered 2019-02-22: 10:00:00 0.25 mg via INTRAVENOUS

## 2019-02-22 MED ORDER — SODIUM CHLORIDE 0.9 % IV SOLN
Freq: Once | INTRAVENOUS | Status: AC
  Filled 2019-02-22: qty 250

## 2019-02-22 MED ORDER — HEPARIN SOD (PORK) LOCK FLUSH 100 UNIT/ML IV SOLN
500.0000 [IU] | Freq: Once | INTRAVENOUS | Status: AC | PRN
  Administered 2019-02-22: 500 [IU]
  Filled 2019-02-22: qty 5

## 2019-02-22 MED ORDER — SODIUM CHLORIDE 0.9% FLUSH
10.0000 mL | Freq: Once | INTRAVENOUS | Status: AC
  Administered 2019-02-22: 10 mL
  Filled 2019-02-22: qty 10

## 2019-02-22 MED ORDER — DEXAMETHASONE SODIUM PHOSPHATE 10 MG/ML IJ SOLN
INTRAMUSCULAR | Status: AC
  Filled 2019-02-22: qty 1

## 2019-02-22 MED ORDER — SODIUM CHLORIDE 0.9% FLUSH
10.0000 mL | INTRAVENOUS | Status: DC | PRN
  Administered 2019-02-22: 10 mL
  Filled 2019-02-22: qty 10

## 2019-02-22 MED ORDER — SODIUM CHLORIDE 0.9% FLUSH
10.0000 mL | INTRAVENOUS | Status: DC | PRN
  Administered 2019-02-22: 08:00:00 10 mL via INTRAVENOUS
  Filled 2019-02-22: qty 10

## 2019-02-22 MED ORDER — SODIUM CHLORIDE 0.9 % IV SOLN
1.4000 mg/m2 | Freq: Once | INTRAVENOUS | Status: AC
  Administered 2019-02-22: 11:00:00 2.4 mg via INTRAVENOUS
  Filled 2019-02-22: qty 4.8

## 2019-02-22 NOTE — Patient Instructions (Signed)

## 2019-02-22 NOTE — Patient Instructions (Signed)
[facility] Discharge Instructions for Patients Receiving Chemotherapy  Today you received the following chemotherapy agents: Trastuzumab, [place]  To help prevent nausea and vomiting after your treatment, we encourage you to take your nausea medication as directed.   If you develop nausea and vomiting that is not controlled by your nausea medication, call the clinic.   BELOW ARE SYMPTOMS THAT SHOULD BE REPORTED IMMEDIATELY:  *FEVER GREATER THAN 100.5 F  *CHILLS WITH OR WITHOUT FEVER  NAUSEA AND VOMITING THAT IS NOT CONTROLLED WITH YOUR NAUSEA MEDICATION  *UNUSUAL SHORTNESS OF BREATH  *UNUSUAL BRUISING OR BLEEDING  TENDERNESS IN MOUTH AND THROAT WITH OR WITHOUT PRESENCE OF ULCERS  *URINARY PROBLEMS  *BOWEL PROBLEMS  UNUSUAL RASH Items with * indicate a potential emergency and should be followed up as soon as possible.  Feel free to call the clinic should you have any questions or concerns. The clinic phone number is [phone].  Please show the [facility] at check-in to the Emergency Department and triage nurse.

## 2019-02-23 ENCOUNTER — Telehealth: Payer: Self-pay | Admitting: Hematology

## 2019-02-23 NOTE — Telephone Encounter (Signed)
Scheduled appt per 2/10 los.  Patient will get an updated appt calendar at her next appt.

## 2019-03-01 ENCOUNTER — Telehealth: Payer: Self-pay

## 2019-03-01 NOTE — Telephone Encounter (Signed)
Called pt to see if she would be able to come in today at 2:30p for her appointment instead of tomorrow morning. No answer, left vm.

## 2019-03-01 NOTE — Telephone Encounter (Signed)
RN Melanie left vm for pt regarding her 08:45 appointment tomorrow morning. Informed her of our 2 hour delay and that she needed to come in for her appointment at 10:00 instead.

## 2019-03-02 ENCOUNTER — Inpatient Hospital Stay: Payer: Medicaid Other

## 2019-03-06 ENCOUNTER — Telehealth: Payer: Self-pay | Admitting: Hematology

## 2019-03-06 NOTE — Telephone Encounter (Signed)
R/s appt from 2/18 - due to weather . Unable to reach pt .,left message with appt date and time

## 2019-03-07 ENCOUNTER — Inpatient Hospital Stay: Payer: Medicaid Other

## 2019-03-07 ENCOUNTER — Telehealth: Payer: Self-pay

## 2019-03-07 ENCOUNTER — Other Ambulatory Visit: Payer: Self-pay

## 2019-03-07 ENCOUNTER — Other Ambulatory Visit: Payer: Self-pay | Admitting: Nurse Practitioner

## 2019-03-07 VITALS — BP 117/55 | HR 76 | Temp 98.3°F | Resp 18

## 2019-03-07 DIAGNOSIS — T451X5A Adverse effect of antineoplastic and immunosuppressive drugs, initial encounter: Secondary | ICD-10-CM

## 2019-03-07 DIAGNOSIS — D701 Agranulocytosis secondary to cancer chemotherapy: Secondary | ICD-10-CM

## 2019-03-07 DIAGNOSIS — C7951 Secondary malignant neoplasm of bone: Secondary | ICD-10-CM

## 2019-03-07 DIAGNOSIS — C50911 Malignant neoplasm of unspecified site of right female breast: Secondary | ICD-10-CM

## 2019-03-07 DIAGNOSIS — Z95828 Presence of other vascular implants and grafts: Secondary | ICD-10-CM

## 2019-03-07 DIAGNOSIS — Z5112 Encounter for antineoplastic immunotherapy: Secondary | ICD-10-CM | POA: Diagnosis not present

## 2019-03-07 LAB — CBC WITH DIFFERENTIAL (CANCER CENTER ONLY)
Abs Immature Granulocytes: 0.01 10*3/uL (ref 0.00–0.07)
Basophils Absolute: 0 10*3/uL (ref 0.0–0.1)
Basophils Relative: 1 %
Eosinophils Absolute: 0 10*3/uL (ref 0.0–0.5)
Eosinophils Relative: 2 %
HCT: 31.9 % — ABNORMAL LOW (ref 36.0–46.0)
Hemoglobin: 10.6 g/dL — ABNORMAL LOW (ref 12.0–15.0)
Immature Granulocytes: 1 %
Lymphocytes Relative: 52 %
Lymphs Abs: 0.8 10*3/uL (ref 0.7–4.0)
MCH: 30.6 pg (ref 26.0–34.0)
MCHC: 33.2 g/dL (ref 30.0–36.0)
MCV: 92.2 fL (ref 80.0–100.0)
Monocytes Absolute: 0.3 10*3/uL (ref 0.1–1.0)
Monocytes Relative: 17 %
Neutro Abs: 0.4 10*3/uL — CL (ref 1.7–7.7)
Neutrophils Relative %: 27 %
Platelet Count: 222 10*3/uL (ref 150–400)
RBC: 3.46 MIL/uL — ABNORMAL LOW (ref 3.87–5.11)
RDW: 15.6 % — ABNORMAL HIGH (ref 11.5–15.5)
WBC Count: 1.6 10*3/uL — ABNORMAL LOW (ref 4.0–10.5)
nRBC: 0 % (ref 0.0–0.2)

## 2019-03-07 LAB — CMP (CANCER CENTER ONLY)
ALT: 38 U/L (ref 0–44)
AST: 19 U/L (ref 15–41)
Albumin: 3.8 g/dL (ref 3.5–5.0)
Alkaline Phosphatase: 60 U/L (ref 38–126)
Anion gap: 10 (ref 5–15)
BUN: 11 mg/dL (ref 6–20)
CO2: 25 mmol/L (ref 22–32)
Calcium: 9.1 mg/dL (ref 8.9–10.3)
Chloride: 107 mmol/L (ref 98–111)
Creatinine: 0.8 mg/dL (ref 0.44–1.00)
GFR, Est AFR Am: >60 mL/min (ref >60)
GFR, Estimated: >60 mL/min (ref >60)
Glucose, Bld: 113 mg/dL — ABNORMAL HIGH (ref 70–99)
Potassium: 3.3 mmol/L — ABNORMAL LOW (ref 3.5–5.1)
Sodium: 142 mmol/L (ref 135–145)
Total Bilirubin: 1.3 mg/dL — ABNORMAL HIGH (ref 0.3–1.2)
Total Protein: 6.3 g/dL — ABNORMAL LOW (ref 6.5–8.1)

## 2019-03-07 MED ORDER — PALONOSETRON HCL INJECTION 0.25 MG/5ML
INTRAVENOUS | Status: AC
  Filled 2019-03-07: qty 5

## 2019-03-07 MED ORDER — SODIUM CHLORIDE 0.9% FLUSH
10.0000 mL | INTRAVENOUS | Status: DC | PRN
  Filled 2019-03-07: qty 10

## 2019-03-07 MED ORDER — SODIUM CHLORIDE 0.9 % IV SOLN: 1.4000 mg/m2 | Freq: Once | INTRAVENOUS | Status: DC

## 2019-03-07 MED ORDER — DEXAMETHASONE SODIUM PHOSPHATE 10 MG/ML IJ SOLN: 5.0000 mg | Freq: Once | INTRAMUSCULAR | Status: DC

## 2019-03-07 MED ORDER — PALONOSETRON HCL INJECTION 0.25 MG/5ML: 0.2500 mg | Freq: Once | INTRAVENOUS | Status: DC

## 2019-03-07 MED ORDER — PEGFILGRASTIM 6 MG/0.6ML ~~LOC~~ PSKT
PREFILLED_SYRINGE | SUBCUTANEOUS | Status: AC
  Filled 2019-03-07: qty 0.6

## 2019-03-07 MED ORDER — SODIUM CHLORIDE 0.9 % IJ SOLN: 10.0000 mL | INTRAMUSCULAR | Status: DC | PRN

## 2019-03-07 MED ORDER — DEXAMETHASONE SODIUM PHOSPHATE 10 MG/ML IJ SOLN
INTRAMUSCULAR | Status: AC
  Filled 2019-03-07: qty 1

## 2019-03-07 MED ORDER — HEPARIN SOD (PORK) LOCK FLUSH 100 UNIT/ML IV SOLN
500.0000 [IU] | Freq: Once | INTRAVENOUS | Status: DC | PRN
  Filled 2019-03-07: qty 5

## 2019-03-07 MED ORDER — FILGRASTIM-SNDZ 300 MCG/0.5ML IJ SOSY
PREFILLED_SYRINGE | INTRAMUSCULAR | Status: AC
  Filled 2019-03-07: qty 0.5

## 2019-03-07 MED ORDER — PEGFILGRASTIM 6 MG/0.6ML ~~LOC~~ PSKT: 6.0000 mg | PREFILLED_SYRINGE | Freq: Once | SUBCUTANEOUS | Status: DC

## 2019-03-07 MED ORDER — SODIUM CHLORIDE 0.9% FLUSH
10.0000 mL | Freq: Once | INTRAVENOUS | Status: AC
  Administered 2019-03-07: 08:00:00 10 mL
  Filled 2019-03-07: qty 10

## 2019-03-07 MED ORDER — FILGRASTIM-SNDZ 300 MCG/0.5ML IJ SOSY: 300.0000 ug | PREFILLED_SYRINGE | Freq: Once | INTRAMUSCULAR | Status: DC

## 2019-03-07 NOTE — Patient Instructions (Addendum)

## 2019-03-07 NOTE — Telephone Encounter (Signed)
CRITICAL VALUE STICKER  CRITICAL VALUE: ANC 0.4  RECEIVER (on-site recipient of call): Lenox Ponds LPN  DATE & TIME NOTIFIED: 03/07/19 9:03  MESSENGER (representative from lab): Ulice Dash   MD NOTIFIED: Cira Rue NP  TIME OF NOTIFICATION: 9:05  RESPONSE: Pt to get GCSF today

## 2019-03-07 NOTE — Progress Notes (Signed)
ANC = 0.4 No chemo today per Cira Rue, NP.   Pt will receive Zarxio. Plan to begin C5 next week.  Kennith Center, Pharm.D., CPP 03/07/2019@9 :28 AM

## 2019-03-07 NOTE — Progress Notes (Signed)
Patient understands neutropenic precautions. She does not wish to have any CSF injections. She refuses any shot. Tried to encourage her to have this injection, she does not wish to have any injection. Interpreter tried to help to encourage her and patient continued to refuse.

## 2019-03-07 NOTE — Patient Instructions (Signed)

## 2019-03-09 NOTE — Progress Notes (Signed)
[place]   [phone] [phone]   Clinic Follow up Note   Patient Care Team: [name], [name], [name] as PCP - General (Physician Assistant) [name], MD as Consulting Physician (Orthopedic Surgery)  [name] of Service:  03/15/2019  CHIEF COMPLAINT: F/u ofmetastatic breast cancer  SUMMARY OF ONCOLOGIC HISTORY: Oncology History Overview Note   Cancer Staging No matching staging information was found for the patient.     [name], right ([place])  11/1997 Cancer Diagnosis   Patient had 1.4 cm poorly differentiated right breast carcinoma diagnosed in Nov 1999 at age 26, 1/7 axillary nodes involved, ER/PR and HER 2 positive. She had mastectomy with the 7 axillary node evaluation, 4 cycles of adriamycin/ cytoxan followed by taxotere, then five years of tamoxifen thru June 2005 (no herceptin in 1999). She had bilateral oophorectomy in May 2005. She was briefly on aromatase inhibitor after tamoxifen, but discontinued this herself due to poor tolerance.   04/04/2015 Genetic Testing   Negative genetic testing on the breast/ovarian cancer pnael.  The Breast/Ovarian gene panel offered by [facility] includes sequencing and rearrangement analysis for the following 20 genes:  ATM, BARD1, BRCA1, BRCA2, BRIP1, CDH1, CHEK2, EPCAM, FANCC, MLH1, MSH2, MSH6, NBN, PALB2, PMS2, PTEN, RAD51C, RAD51D, TP53, and XRCC2.   06/26/2015 Pathology Results   Initial Path Report Bone, curettage, Left femur met, intramedullary subtroch tissue - METASTATIC ADENOCARCINOMA.  Estrogen Receptor: 95%, POSITIVE, STRONG STAINING INTENSITY Progesterone Receptor: 2%, POSITIVE, STRONG  HER2 (+), IHC 3+   06/26/2015 Surgery   Biopsy of metastatic tissue and stabilization with Affixus trochanteric nail, proximal and distal interlock of left femur by Dr. [name]    06/28/2015 Imaging   CT Chest w/ Contrast 1. Extensive right internal mammary chain  lymphadenopathy consistent with metastatic breast cancer. 2. Lytic metastases involving the posterior elements at T1 with probable nondisplaced pathologic fracture of the spinous process. 3. No other evidence of thoracic metastatic disease. 4. Trace bilateral pleural effusions with associated bibasilar atelectasis. 5. Indeterminate right thyroid nodule, not previously imaged. This could be evaluated with thyroid ultrasound as clinically warranted.   07/01/2015 Imaging   Bone Scan 1. Increased activity noted throughout the left femur. Although metastatic disease cannot be excluded, these changes are most likely secondary to prior surgery. 2. No other focal abnormalities identified to suggest metastatic disease.   07/12/2015 - 07/26/2015 Radiation Therapy   Left femur, 30 Gy in 10 fractions by Dr. Sondra Come    07/14/2015 Initial Diagnosis   [name], right ([place])   07/19/2015 Imaging   Initial PET Scan 1. Severe multifocal osseous metastatic disease, much greater than anticipated based on prior imaging. 2. Multifocal nodal metastases involving the right internal mammary, prevascular and subpectoral lymph nodes. No axillary pulmonary involvement identified. 3. No distant extra osseous metastases.   07/30/2015 - 11/29/2015 Chemotherapy   Docetaxel, Herceptin and Perjeta every 3 weeks X6 cycles, pt tolerated moderately well, restaging scan showed excellent response    09/18/2015 Imaging   CT Head w/wo Contrast 1. No acute intracranial abnormality or significant interval change. 2. Stable minimal periventricular white matter hypoattenuation on the right. This may reflect a remote ischemic injury. 3. No evidence for metastatic disease to the brain. 4. No focal soft tissue lesion to explain the patient's tenderness.   10/30/2015 Imaging   CT Abdomen Pelvis w/ Contrast 1. Few mildly prominent fluid-filled loops of small bowel scattered throughout the abdomen, with  intraluminal fluid density within the distal colon. Given the provided history, findings are suggestive of acute enteritis/diarrheal illness. 2. No other acute intra-abdominal or pelvic process identified. 3. Widespread osseous metastatic disease, better evaluated on most recent PET-CT from 07/19/2015. No pathologic fracture or other complication. 4. 11 mm hypodensity within the left kidney. This lesion measures intermediate density, and is indeterminate. While this lesion is similar in size relative to recent studies, this is increased in size relative to prior study from 2012. Further evaluation with dedicated renal mass protocol CT and/or MRI is recommended for complete characterization.   12/2015 - 05/14/2016 Chemotherapy   Maintenance Herceptin and pejeta every 3 weeks stopped on 05/14/16 due to disease progression   12/25/2015 Imaging   Restaging PET Scan 1. Markedly improved skeletal activity, with only several faint foci of residual accentuated metabolic activity, but resolution of the vast majority of the previously extensive osseous metastatic disease. 2. Resolution of the prior right internal mammary and right prevascular lymph nodes. 3. Residual subcutaneous edema and edema along fascia planes in the left upper thigh. This remains somewhat more than I would expect for placement of an IM nail 6 weeks ago. If there is leg swelling further down, consider Doppler venous ultrasound to rule out left lower extremity DVT. I do not perceive an obvious difference in density between the pelvic and common femoral veins on the noncontrast CT data. 4. Chronic right maxillary and right sphenoid sinusitis.   01/16/2016 - 10/20/2017 Anti-estrogen oral therapy   Letrozole 2.5 mg daily. She was switched to exemestane due to disease progression on 02/08/17. Stopped on 10/20/2017 when treatment changed to chemo    03/01/2016 Miscellaneous   Patient presented to ED following a fall; she reports she landed on her  back and is now experiencing back pain. The patient was evaluated and discharge home same day with pain control.   05/01/2016 Imaging   CT CAP IMPRESSION: 1. Stable exam.  No new or progressive disease identified. 2. No evidence for residual or recurrent adenopathy within the chest. 3. Stable bone metastasis.   05/01/2016 Imaging   BONE SCAN IMPRESSION: 1. Small focal uptake involving the anterior rib ends of the left second and right fourth ribs, new since the prior bone scan. The location of this uptake is more suggestive of a traumatic or inflammatory etiology as opposed to metastatic disease. No other evidence of metastatic disease. 2. There are other areas of stable uptake which are most likely degenerative/reactive, including right shoulder and cervical spine uptake and uptake along the right femur adjacent to the ORIF hardware.   05/20/2016 Imaging   NM PET Skull to Thigh IMPRESSION: 1. There multiple metastatic foci in the bony pelvis, including some new abnormal foci compare to the prior PET-CT, compatible with mildly progressive bony metastatic disease. Also, a left T11 vertebral hypermetabolic metastatic lesion is observed, new compared to the prior exam. 2. No active extraosseous malignancy is currently identified. 3. Right anterior fourth rib healing fracture, appears be   06/04/2016 - 01/08/2017 Chemotherapy   Second line chemotherapy Kadcyla every 3 weeks started on 06/04/16 and stopped on 01/08/17 due to mixed response.     10/05/2016 Imaging   CT CAP and Bone Scan:  Bones: left intramedullary rod and left femoral head neck screw in place. In the proximal diaphysis of the left femur there is cortical thickening and irregularity. No lytic or blastic bone lesions. No fracture or vertebral endplate destruction.   No definite evidence of recurrent  disease in the chest, abdomen, or pelvis.   10/28/2016 Imaging   CT A/P IMPRESSION: No acute process demonstrated in  the abdomen or pelvis. No evidence of bowel obstruction or inflammation.   11/04/2016 Imaging   DG foot complete left: IMPRESSION: No fracture or dislocation.  No soft tissue abnormality   11/23/2016 Imaging   CT RIGHT FEMUR IMPRESSION: Negative CT scan of the right thigh.  No visible metastatic disease.  CT LEFT FEMUR IMPRESSION: 1. Postsurgical changes related to prior cephalomedullary rod fixation of the left femur with linear lucency along the anterolateral proximal femoral cortex at level of the lesser trochanter, suspicious for nondisplaced fracture. 2. Slightly permeative appearance of the proximal left femoral cortex at the site of known prior osseous metastases is largely unchanged. 3. Unchanged patchy lucency and sclerosis along the anterior acetabulum, which appears to correspond with an area of faint uptake on the prior PET-CT, suspicious for osseous metastasis. These results will be called to the ordering clinician or representative by the Radiologist Assistant, and communication documented in the PACS or zVision Dashboard.     01/27/2017 Imaging   IMPRESSION: 1. Mixed response to osseous metastasis. Some hypermetabolic lesions are new and increasingly hypermetabolic, while another index lesion is decreasingly hypermetabolic. 2. No evidence of hypermetabolic soft tissue metastasis.    02/08/2017 - 10/19/2017 Antibody Plan   -Herceptin every 3 week since 02/08/17 -Oral Lanpantinib 1548m and decreased to 5027mdue to diarrhea and depression. Due to disease progression she swithced to Verzenio 15032mn 05/04/17. She did not toelrate well so she was switched to Ibrance 100m35m 05/31/17. Due to her neutropenia, Ibrance dose reduced to 75 mg daily, 3 weeks on, one-week off, stopped on 10/20/2017 due to disease progression.    04/30/2017 PET scan   IMPRESSION: 1. Primarily increase in hypermetabolism of osseous metastasis. An isolated right acetabular lesion is  decreased in hypermetabolism since the prior exam. 2. No evidence of hypermetabolic soft tissue metastasis. 3. Similar non FDG avid right-sided thyroid nodule, favoring a benign etiology. Recommend attention on follow-up.   08/30/2017 PET scan   08/30/2017 PET Scan  IMPRESSION: 1. Worsening osseous metastatic disease. 2. Focal hypermetabolism in the central spinal canal the L1 level, similar to the prior exam. Metastatic implant cannot be excluded. 3. Slight enlargement of a right thyroid nodule which now appears more cystic in character. Consider further evaluation with thyroid ultrasound. If patient is clinically hyperthyroid, consider nuclear medicine thyroid uptake and scan   08/30/2017 Progression   08/30/2017 PET Scan  IMPRESSION: 1. Worsening osseous metastatic disease. 2. Focal hypermetabolism in the central spinal canal the L1 level, similar to the prior exam. Metastatic implant cannot be excluded. 3. Slight enlargement of a right thyroid nodule which now appears more cystic in character. Consider further evaluation with thyroid ultrasound. If patient is clinically hyperthyroid, consider nuclear medicine thyroid uptake and scan   09/29/2017 - 10/11/2017 Radiation Therapy   Pt received 27 Gy in 9 fractions directed at the areas of significant uptake noted on recent bone scan the right pelvis and right proximal femur, with Dr, KinaSondra Come10/09/2017 - 04/07/2018 Chemotherapy   -Herceptin every 3 weeks starting 02/08/17, perjeta added on 10/20/2017  -weekly Taxol started on 10/21/2017. Will reduce Taxol to 2 weeks on, 1 week off starting on 02/04/17.  Stoped due to mild disease progression   10/27/2017 Imaging   10/27/2017 CT AP IMPRESSION: 1. No acute abnormality. No evidence of bowel obstruction or acute bowel  inflammation. Normal appendix. 2. Stable patchy sclerotic osseous metastases throughout the visualized skeleton. No new or progressive metastatic disease in the abdomen  or pelvis.   01/11/2018 Imaging   Whole Body Bone Scan 01/11/18  IMPRESSION: 1. Multifocal osseous metastases as described. 2. Right scapular metastasis. 3. Right superior thoracic spinous process. 4. Right sacral ala and right L5 metastases. 5. Right acetabular metastasis. 6. Right proximal femur metastasis. 7. Diffuse metastases throughout the left femur. 8. Anterior right fourth rib metastasis.   01/11/2018 Imaging   CT CAP W Contrast 01/11/18  IMPRESSION: 1. Similar-appearing osseous metastatic disease. 2. No evidence for progressed metastatic disease in the chest, abdomen or pelvis.   03/30/2018 Imaging   Bone Scan 03/30/18  IMPRESSION: Stable multifocal bony metastatic disease.   03/30/2018 Imaging    CT CAP W Contrast 03/30/18 IMPRESSION: 1. New mild contiguous wall thickening with associated mucosal hyperenhancement throughout the left colon and rectum, suggesting nonspecific infectious or inflammatory proctocolitis. Differential includes C diff colitis. 2. Stable faint patchy sclerosis in the iliac bones and lumbar vertebral bodies. No new or progressive osseous metastatic disease by CT. Please note that the osseous lesions are relatively occult by CT and would be better evaluated by PET-CT, as clinically warranted. 3. No evidence of extra osseous metastatic disease.  These results will be called to the ordering clinician or representative by the Radiologist Assistant, and communication documented in the PACS or zVision Dashboard.   04/15/2018 - 06/16/2018 Chemotherapy   Enhertu q3weeks starting 04/15/18. Stopped Enhertu after cycle 3 on 05/27/18 due to poor toleration due to poor toleration with anorexia, weight loss, N&V, diarrhea   04/21/2018 PET scan   PET 04/21/18  IMPRESSION: 1. Overall marked interval improvement in bony hypermetabolic disease. All of the previously identified hypermetabolic osseous metastases have decreased in hypermetabolism in the interval.  There is a new tiny focus of hypermetabolism in the right aspect of the L3 vertebral body that appears to correspond to a new 8 mm lucency, raising concern for new metastatic disease. Close attention on follow-up recommended. 2. Focal hypermetabolism in the central spinal canal at the level of L1 previously has resolved. 3. No hypermetabolic soft tissue disease on today's study. 4. Right thyroid nodule seen previously has decreased in the interval.    06/16/2018 Imaging   CT LUMBAR SPINE WO CONTRAST 06/16/18 IMPRESSION: 1. Transitional lumbosacral anatomy. Correlation with radiographs is recommended prior to any operative intervention. 2. Suspicion of a right L3 vertebral body metastasis on the April PET-CT is not evident by CT. Stable degenerative versus metastatic lesion at the right L5 superior endplate. Small but increased right iliac bone metastasis since March. Lumbar MRI would best characterize lumbar metastatic disease (without contrast if necessary). 3. Suspect L2-L3 left subarticular segment disc herniation. Query left L2 radiculitis. 4. Stable appearance of rightward L4-L5 disc degeneration which could affect the right foramen and right lateral recess.   06/17/2018 - 11/16/2018 Chemotherapy   Restart Taxol 2 weeks on/1 week off with Herceptin and Perjeta q3weeks on 06/17/18. Perjeta was stopped after C5 due to diarrhea. Stopped Taxol after 11/16/18 due to disease progression to L3.    07/06/2018 PET scan   PET  IMPRESSION: 1. Near total resolution of hypermetabolic activity associated with skeletal metastasis. Very mild activity associated with the LEFT iliac bone which is similar to background blood pool activity. 2. No new or progressive disease.   11/21/2018 PET scan   IMPRESSION: 1. New focal hypermetabolic lesion eccentric to the right  in the L3 vertebral body, maximum SUV 6.1 compatible with a new focus of active malignancy. 2. Very low-grade activity similar to  blood pool in previous existing mildly sclerotic metastatic lesions including the left iliac bone lesion. Some of the mildly sclerotic lesions are not hypermetabolic compatible with effectively treated bony metastatic disease. 3. No hypermetabolic soft tissue lesions are identified.     11/30/2018 -  Chemotherapy   Continue Herceptin q3weeks    12/12/2018 - 12/17/2018 Chemotherapy   Tucatinib (TUKYSA) 6 tabs BID as a next line therapy starting 12/12/18. Stopped 12/17/18 duet o N&V, poor appetite and HA    12/21/2018 -  Chemotherapy   IV Eribulin on day 1, 8 every 21 days starting 12/21/18      CURRENT THERAPY:  -Continue Herceptin q3weeks -IVEribulin on day 1, 8 every 21 days starting 12/21/18. Onpro patch added C2D8.Dose reduced after 03/07/19 due to Neutropenia.   INTERVAL HISTORY:  [name] is here for a follow up and treatment. She presents to the clinic with her interpretor. She notes her Ambien was supposed to be refilled and was not done so she did not sleep well for 4 days and cancelled her Echo because she was too tired. She now has Ambien back but upset it took so long.   She notes she has no nausea or diarrhea in the last 2 weeks off treatment.    REVIEW OF SYSTEMS:   Constitutional: Denies fevers, chills or abnormal weight loss Eyes: Denies blurriness of vision Ears, nose, mouth, throat, and face: Denies mucositis or sore throat Respiratory: Denies cough, dyspnea or wheezes Cardiovascular: Denies palpitation, chest discomfort or lower extremity swelling Gastrointestinal:  Denies nausea, heartburn or change in bowel habits Skin: Denies abnormal skin rashes Lymphatics: Denies new lymphadenopathy or easy bruising Neurological:Denies numbness, tingling or new weaknesses Behavioral/Psych: Mood is stable, no new changes  All other systems were reviewed with the patient and are negative.  MEDICAL HISTORY:  Past Medical History:  Diagnosis [name]  . Abnormal Pap  smear   . Anxiety   . Breast cancer ([place])   . Cancer ([place]) 1999   breast-s/p mastectomy, chemo, rad  . CIN I (cervical intraepithelial neoplasia I) 2003   by colpo  . Congenital deafness   . Deaf   . Depression   . Port-A-Cath in place 2017   for chemotherapy  . Radiation 07/12/15-07/26/15   left femur 30 Gy  . S/P bilateral oophorectomy     SURGICAL HISTORY: Past Surgical History:  Procedure Laterality [name]  . ABDOMINAL HYSTERECTOMY    . INTRAMEDULLARY (IM) NAIL INTERTROCHANTERIC Left 06/26/2015   Procedure: LEFT BIOMET LONG AFFIXS NAIL;  Surgeon: [name], MD;  Location: [facility];  Service: Orthopedics;  Laterality: Left;  . IR GENERIC HISTORICAL  08/06/2015   IR US GUIDE VASC ACCESS LEFT 08/06/2015 Aletta Edouard, MD [facility]  . IR GENERIC HISTORICAL  08/06/2015   IR FLUORO GUIDE CV LINE LEFT 08/06/2015 Aletta Edouard, MD [facility]  . IR GENERIC HISTORICAL  03/05/2016   IR CV LINE INJECTION 03/05/2016 Markus Daft, MD [facility]  . LEFT HEART CATH AND CORONARY ANGIOGRAPHY N/A 12/13/2017   Procedure: LEFT HEART CATH AND CORONARY ANGIOGRAPHY;  Surgeon: Leonie Man, MD;  Location: [facility];  Service: Cardiovascular;  Laterality: N/A;  . MASTECTOMY     right breast  . MASTECTOMY    . OVARIAN CYST REMOVAL    . RADIOLOGY WITH ANESTHESIA Left 06/25/2015  Procedure: MRI OF LEFT HIP WITH OR WITHOUT CONTRAST;  Surgeon: Medication Radiologist, MD;  Location: [facility];  Service: Radiology;  Laterality: Left;  DR. MCINTYRE/MRI  . TUBAL LIGATION      I have reviewed the social history and family history with the patient and they are unchanged from previous note.  ALLERGIES:  is allergic to promethazine hcl; chlorhexidine; and diphenhydramine hcl.  MEDICATIONS:  Current Outpatient Medications  Medication Sig Dispense Refill  . albuterol (PROAIR HFA) 108 (90 Base) MCG/ACT inhaler Inhale 2 puffs into the lungs every 6 (six) hours as needed for wheezing or shortness of  breath.    Marland Kitchen albuterol (PROVENTIL HFA;VENTOLIN HFA) 108 (90 Base) MCG/ACT inhaler Inhale 2 puffs into the lungs every 6 (six) hours as needed for wheezing or shortness of breath. 1 Inhaler 0  . ALPRAZolam (XANAX) 0.25 MG tablet Take 1 tablet (0.25 mg total) by mouth 2 (two) times daily as needed for anxiety. 30 tablet 0  . diphenoxylate-atropine (LOMOTIL) 2.5-0.025 MG tablet Take 1-2 tablets by mouth 4 (four) times daily as needed for diarrhea or loose stools. 30 tablet 1  . lidocaine-prilocaine (EMLA) cream Apply 1 application topically as needed. Apply to Porta-Cath 1-2 hours prior to access as directed. (Patient taking differently: Apply 1 application topically as needed (To Porta-Cath 1-2 hours prior to access as directed). ) 90 g 2  . magnesium oxide (MAG-OX) 400 (241.3 Mg) MG tablet Take 1 tablet (400 mg total) by mouth daily. 30 tablet 3  . methocarbamol (ROBAXIN) 500 MG tablet Take 1 tablet (500 mg total) by mouth 2 (two) times daily. (Patient taking differently: Take 500 mg by mouth 2 (two) times daily as needed for muscle spasms. ) 20 tablet 0  . omeprazole (PRILOSEC) 20 MG capsule Take 1 capsule (20 mg total) by mouth daily. 30 capsule 3  . ondansetron (ZOFRAN) 8 MG tablet Take 1 tablet (8 mg total) by mouth every 8 (eight) hours as needed for nausea or vomiting. 45 tablet 1  . oxyCODONE (ROXICODONE) 5 MG immediate release tablet Take 1 tablet (5 mg total) by mouth every 8 (eight) hours as needed for severe pain. 60 tablet 0  . potassium chloride (KLOR-CON) 10 MEQ tablet Take 3 tablets (30 mEq total) by mouth daily. 90 tablet 0  . prochlorperazine (COMPAZINE) 10 MG tablet Take 1 tablet (10 mg total) by mouth 2 (two) times daily as needed for nausea or vomiting. 10 tablet 0  . zolpidem (AMBIEN) 5 MG tablet Take 1-2 tablets (5-10 mg total) by mouth at bedtime as needed. 60 tablet 0   No current facility-administered medications for this visit.   Facility-Administered Medications Ordered in  Other Visits  Medication Dose Route Frequency Provider Last Rate Last Admin  . potassium chloride SA (K-DUR) CR tablet 40 mEq  40 mEq Oral Once Truitt Merle, MD      . sodium chloride 0.9 % 1,000 mL with potassium chloride 10 mEq infusion   Intravenous Continuous Gordy Levan, MD   Stopped at 09/10/15 1653  . sodium chloride 0.9 % injection 10 mL  10 mL Intravenous PRN Livesay, Lennis P, MD        PHYSICAL EXAMINATION: ECOG PERFORMANCE STATUS: 1 - Symptomatic but completely ambulatory  Vitals:   03/15/19 0929  BP: 118/74  Pulse: 78  Resp: 16  Temp: 98.9 F (37.2 C)  SpO2: 99%   Filed Weights   03/15/19 0929  Weight: 138 lb 6.4 oz (62.8 kg)  Due to [place] we will limit examination to appearance. Patient had no complaints.  GENERAL:alert, no distress and comfortable SKIN: skin color normal, no rashes or significant lesions EYES: normal, Conjunctiva are pink and non-injected, sclera clear  NEURO: alert & oriented x 3 with fluent speech   LABORATORY DATA:  I have reviewed the data as listed CBC Latest Ref Rng & Units 03/15/2019 03/07/2019 02/22/2019  WBC 4.0 - 10.5 K/uL 6.5 1.6(L) 4.9  Hemoglobin 12.0 - 15.0 g/dL 11.6(L) 10.6(L) 10.0(L)  Hematocrit 36.0 - 46.0 % 34.1(L) 31.9(L) 30.6(L)  Platelets 150 - 400 K/uL 212 222 140(L)     CMP Latest Ref Rng & Units 03/15/2019 03/07/2019 02/22/2019  Glucose 70 - 99 mg/dL 95 113(H) 89  BUN 6 - 20 mg/dL '14 11 8  '$ Creatinine 0.44 - 1.00 mg/dL 0.84 0.80 0.81  Sodium 135 - 145 mmol/L 142 142 144  Potassium 3.5 - 5.1 mmol/L 3.7 3.3(L) 3.6  Chloride 98 - 111 mmol/L 105 107 108  CO2 22 - 32 mmol/L '29 25 26  '$ Calcium 8.9 - 10.3 mg/dL 9.5 9.1 9.0  Total Protein 6.5 - 8.1 g/dL 6.6 6.3(L) 6.2(L)  Total Bilirubin 0.3 - 1.2 mg/dL 1.7(H) 1.3(H) 1.2  Alkaline Phos 38 - 126 U/L 72 60 80  AST 15 - 41 U/L '21 19 26  '$ ALT 0 - 44 U/L 33 38 40      RADIOGRAPHIC STUDIES: I have personally reviewed the radiological images as listed and agreed with the  findings in the report. No results found.   ASSESSMENT & PLAN:  RHILEY TARVER is a 54 y.o. 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 She had right mastectomy, 4 cycles of AC-T and 5 years of Tamoxifen/AI.  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 She has received Target RT. She had excellent response to First-line Docetaxel/Herceptin/Perjeta and AI but progressed on maintenance therapy. She had mixed response on second line Kadcyla, progressed on third-line Ibrance/Herceptin and poorly tolerated Enhertu.  -She triedTucatinib (TUKYSA)with low doseon 12/12/18 however due to N&V, poor appetite and HA she stopped on 12/17/18 after trying twice. -I started her on next lineivchemoEribulin on day 1, 8, every 21 days on 12/21/18, along with Herceptin every 3 weeks.  -She continues to tolerate well with recent neutropenia so the rest of cycle 4 was canceled. Will proceed with C5D1 Eribulin and Herceptin with chemo dose reduction on Friday if counts have recovered.  -Plan to repeat PET in 2-3 weeks.  -F/u in 3 weeks    2. N&V  -Secondary to chemo  -Continue prophylacticCompazineor Zofran.I offeredsublingual Zofran,she declined for now. -Aloxi was added with C2 Erbilin. -Overall resolved lately on Eribulin  3.Body pain, secondary to bone mets, Hypocalcemia, leg cramps  -Since her cancer diagnosis,she has had b/l hip, shoulder and chest painat different time points, likely related to her bone mets -She declined Gabapentin given risk of suicidal thoughts. -She is being treated with Zometa q64month. She does have hypocalcemia with leg cramps secondary to Zometa.  -She will continue oral calcium and Vit Dand Prilosec for Acid Reflux. -Current pain includes, lower back, left upper leg and recently her right leg and shoulder. She has  increased use to 5-'10mg'$  1-2 times a day.  -Since recent fall and soreness since she increased oxycodone '5mg'$  to 1-2 tabs TID.  4.Congenital deafness  5.Mild  anemia, secondary to chemotherapy and malignancy -She will start with oral iron daily for 1-2 months and then stop. She will also start daily women's vitamin. -Stable mild anemia   6.Hypokalemia, Diarrhea -She developed diarrhea s/p cycle 3 Perjeta.Has been d/c -Ipreviously started her onMag '400mg'$  daily and Oral potassium 43mq2-3 times a day. If she cannot take 3 tabs I encouraged her to increase potassium in diet.   7.Goal of care discussion -she understand her goal of care is palliative to prolong her life -she is full code for now  8.Insomnia -She cannot take Benadryl due to allergy reaction. She has tried trazodone with no relief.  -She is on Xanax and Ambien 1-2 tabs nightly.I advised her to not take them at the same time.   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 She declined using ice bags using infusion -stable. Will watch on chemo.  10. Neutropenia, secondary to chemo -Seen after C4D1 with ANC 0.4 on 03/07/19 -She has been on Onpro on day 8 which has not been enough. I recommend Granix injections the day after chemo, she is apprehensive due to needles and frequent visits.  -Will try to dose reduce chemo Eribulin first and if not enough will reduce Eribulin to every 2 weeks with continued Onpro patch. She is agreeable.   11. Mild hyperbilirubinemia -likely second to chemotherapy, fluctuates  -tbil 1.7 today, will monitor closely   PLAN: -Lab today, I have reviewed the results  -Labs reviewed and adequate to proceed with Herceptin and C5D1Eribulinon Friday with dose reduction due to her recent neutropenia   -Eribulin next weeks on Friday, with Onpro Patch on day 8  -ECHO next week  -PET scan in 2-3 weeks  -Lab and F/u on 3/24 before next cycle chemo    No problem-specific Assessment &  Plan notes found for this encounter.   Orders Placed This Encounter  Procedures  . NM PET Image Restag (PS) Skull Base To Thigh    Standing Status:   Future    Standing Expiration [name]:   03/14/2020    Order Specific Question:   If indicated for the ordered procedure, I authorize the administration of a radiopharmaceutical per Radiology protocol    Answer:   Yes    Order Specific Question:   Is the patient pregnant?    Answer:   No    Order Specific Question:   Preferred imaging location?    [facility]   [facility]   Order Specific Question:   Radiology Contrast Protocol - do NOT remove file path    Answer:   \\[email or web address]   All questions were answered. The patient knows to call the clinic with any problems, questions or concerns. No barriers to learning was detected. The total time spent in the appointment was 30 minutes.     YTruitt Merle MD 03/15/2019   I, [name] am acting as scribe for YTruitt Merle MD.   I have reviewed the above documentation for accuracy and completeness, and I agree with the above.

## 2019-03-13 ENCOUNTER — Other Ambulatory Visit: Payer: Self-pay

## 2019-03-13 ENCOUNTER — Inpatient Hospital Stay: Payer: Medicaid Other | Attending: Hematology

## 2019-03-13 ENCOUNTER — Inpatient Hospital Stay (HOSPITAL_COMMUNITY): Admission: RE | Admit: 2019-03-13 | Payer: Medicaid Other | Source: Ambulatory Visit

## 2019-03-13 ENCOUNTER — Inpatient Hospital Stay: Payer: Medicaid Other

## 2019-03-13 DIAGNOSIS — D6481 Anemia due to antineoplastic chemotherapy: Secondary | ICD-10-CM | POA: Insufficient documentation

## 2019-03-13 DIAGNOSIS — E876 Hypokalemia: Secondary | ICD-10-CM | POA: Insufficient documentation

## 2019-03-13 DIAGNOSIS — Z5111 Encounter for antineoplastic chemotherapy: Secondary | ICD-10-CM | POA: Insufficient documentation

## 2019-03-13 DIAGNOSIS — Z5112 Encounter for antineoplastic immunotherapy: Secondary | ICD-10-CM | POA: Insufficient documentation

## 2019-03-13 DIAGNOSIS — Z170 Estrogen receptor positive status [ER+]: Secondary | ICD-10-CM | POA: Insufficient documentation

## 2019-03-13 DIAGNOSIS — G4700 Insomnia, unspecified: Secondary | ICD-10-CM | POA: Insufficient documentation

## 2019-03-13 DIAGNOSIS — G620 Drug-induced polyneuropathy: Secondary | ICD-10-CM | POA: Insufficient documentation

## 2019-03-13 DIAGNOSIS — C7951 Secondary malignant neoplasm of bone: Secondary | ICD-10-CM | POA: Insufficient documentation

## 2019-03-13 DIAGNOSIS — H905 Unspecified sensorineural hearing loss: Secondary | ICD-10-CM | POA: Insufficient documentation

## 2019-03-13 DIAGNOSIS — C50911 Malignant neoplasm of unspecified site of right female breast: Secondary | ICD-10-CM | POA: Insufficient documentation

## 2019-03-13 MED ORDER — ONDANSETRON HCL 8 MG PO TABS: 8.0000 mg | ORAL_TABLET | Freq: Three times a day (TID) | ORAL | 1 refills | Status: DC | PRN

## 2019-03-13 MED ORDER — ZOLPIDEM TARTRATE 5 MG PO TABS: 5.0000 mg | ORAL_TABLET | Freq: Every evening | ORAL | 0 refills | Status: DC | PRN

## 2019-03-15 ENCOUNTER — Inpatient Hospital Stay: Payer: Medicaid Other

## 2019-03-15 ENCOUNTER — Other Ambulatory Visit: Payer: Self-pay

## 2019-03-15 ENCOUNTER — Inpatient Hospital Stay (HOSPITAL_BASED_OUTPATIENT_CLINIC_OR_DEPARTMENT_OTHER): Payer: Medicaid Other | Admitting: Hematology

## 2019-03-15 ENCOUNTER — Encounter: Payer: Self-pay | Admitting: Hematology

## 2019-03-15 VITALS — BP 118/74 | HR 78 | Temp 98.9°F | Resp 16 | Ht 65.0 in | Wt 138.4 lb

## 2019-03-15 DIAGNOSIS — Z5112 Encounter for antineoplastic immunotherapy: Secondary | ICD-10-CM | POA: Diagnosis present

## 2019-03-15 DIAGNOSIS — D6481 Anemia due to antineoplastic chemotherapy: Secondary | ICD-10-CM | POA: Diagnosis not present

## 2019-03-15 DIAGNOSIS — C799 Secondary malignant neoplasm of unspecified site: Secondary | ICD-10-CM

## 2019-03-15 DIAGNOSIS — C7951 Secondary malignant neoplasm of bone: Secondary | ICD-10-CM

## 2019-03-15 DIAGNOSIS — Z170 Estrogen receptor positive status [ER+]: Secondary | ICD-10-CM | POA: Diagnosis not present

## 2019-03-15 DIAGNOSIS — C50911 Malignant neoplasm of unspecified site of right female breast: Secondary | ICD-10-CM

## 2019-03-15 DIAGNOSIS — C50919 Malignant neoplasm of unspecified site of unspecified female breast: Secondary | ICD-10-CM

## 2019-03-15 DIAGNOSIS — G620 Drug-induced polyneuropathy: Secondary | ICD-10-CM | POA: Diagnosis not present

## 2019-03-15 DIAGNOSIS — Z5111 Encounter for antineoplastic chemotherapy: Secondary | ICD-10-CM | POA: Diagnosis present

## 2019-03-15 DIAGNOSIS — G4700 Insomnia, unspecified: Secondary | ICD-10-CM | POA: Diagnosis not present

## 2019-03-15 DIAGNOSIS — H905 Unspecified sensorineural hearing loss: Secondary | ICD-10-CM | POA: Diagnosis not present

## 2019-03-15 DIAGNOSIS — E876 Hypokalemia: Secondary | ICD-10-CM | POA: Diagnosis not present

## 2019-03-15 LAB — CMP (CANCER CENTER ONLY)
ALT: 33 U/L (ref 0–44)
AST: 21 U/L (ref 15–41)
Albumin: 3.9 g/dL (ref 3.5–5.0)
Alkaline Phosphatase: 72 U/L (ref 38–126)
Anion gap: 8 (ref 5–15)
BUN: 14 mg/dL (ref 6–20)
CO2: 29 mmol/L (ref 22–32)
Calcium: 9.5 mg/dL (ref 8.9–10.3)
Chloride: 105 mmol/L (ref 98–111)
Creatinine: 0.84 mg/dL (ref 0.44–1.00)
GFR, Est AFR Am: >60 mL/min (ref >60)
GFR, Estimated: >60 mL/min (ref >60)
Glucose, Bld: 95 mg/dL (ref 70–99)
Potassium: 3.7 mmol/L (ref 3.5–5.1)
Sodium: 142 mmol/L (ref 135–145)
Total Bilirubin: 1.7 mg/dL — ABNORMAL HIGH (ref 0.3–1.2)
Total Protein: 6.6 g/dL (ref 6.5–8.1)

## 2019-03-15 LAB — CBC WITH DIFFERENTIAL (CANCER CENTER ONLY)
Abs Immature Granulocytes: 0.02 10*3/uL (ref 0.00–0.07)
Basophils Absolute: 0 10*3/uL (ref 0.0–0.1)
Basophils Relative: 1 %
Eosinophils Absolute: 0 10*3/uL (ref 0.0–0.5)
Eosinophils Relative: 0 %
HCT: 34.1 % — ABNORMAL LOW (ref 36.0–46.0)
Hemoglobin: 11.6 g/dL — ABNORMAL LOW (ref 12.0–15.0)
Immature Granulocytes: 0 %
Lymphocytes Relative: 20 %
Lymphs Abs: 1.3 10*3/uL (ref 0.7–4.0)
MCH: 30.4 pg (ref 26.0–34.0)
MCHC: 34 g/dL (ref 30.0–36.0)
MCV: 89.5 fL (ref 80.0–100.0)
Monocytes Absolute: 0.6 10*3/uL (ref 0.1–1.0)
Monocytes Relative: 8 %
Neutro Abs: 4.6 10*3/uL (ref 1.7–7.7)
Neutrophils Relative %: 71 %
Platelet Count: 212 10*3/uL (ref 150–400)
RBC: 3.81 MIL/uL — ABNORMAL LOW (ref 3.87–5.11)
RDW: 14.8 % (ref 11.5–15.5)
WBC Count: 6.5 10*3/uL (ref 4.0–10.5)
nRBC: 0 % (ref 0.0–0.2)

## 2019-03-15 LAB — VITAMIN D 25 HYDROXY (VIT D DEFICIENCY, FRACTURES): Vit D, 25-Hydroxy: 28.68 ng/mL — ABNORMAL LOW (ref 30–100)

## 2019-03-15 LAB — MAGNESIUM: Magnesium: 1.7 mg/dL (ref 1.7–2.4)

## 2019-03-15 NOTE — Progress Notes (Signed)
Per Dr. Burr Medico: OK to administer trastuzumab this Friday 03/17/19 using echo from 11/2018.   New echocardiogram scheduled for 03/20/19 at 09:00

## 2019-03-16 ENCOUNTER — Telehealth: Payer: Self-pay | Admitting: Hematology

## 2019-03-16 LAB — CANCER ANTIGEN 27.29: CA 27.29: 45.6 U/mL — ABNORMAL HIGH (ref 0.0–38.6)

## 2019-03-16 NOTE — Telephone Encounter (Signed)
Scheduled appt per 3/3 los.  Patient will get a print out while in treatment on 3/5.

## 2019-03-17 ENCOUNTER — Other Ambulatory Visit: Payer: Self-pay

## 2019-03-17 ENCOUNTER — Inpatient Hospital Stay: Payer: Medicaid Other

## 2019-03-17 ENCOUNTER — Other Ambulatory Visit (HOSPITAL_COMMUNITY): Payer: Medicaid Other

## 2019-03-17 VITALS — BP 111/67 | HR 74 | Temp 98.0°F | Resp 18

## 2019-03-17 DIAGNOSIS — C50911 Malignant neoplasm of unspecified site of right female breast: Secondary | ICD-10-CM

## 2019-03-17 DIAGNOSIS — Z5112 Encounter for antineoplastic immunotherapy: Secondary | ICD-10-CM | POA: Diagnosis not present

## 2019-03-17 MED ORDER — ACETAMINOPHEN 325 MG PO TABS
650.0000 mg | ORAL_TABLET | Freq: Once | ORAL | Status: AC
  Administered 2019-03-17: 09:00:00 650 mg via ORAL

## 2019-03-17 MED ORDER — ACETAMINOPHEN 325 MG PO TABS
ORAL_TABLET | ORAL | Status: AC
  Filled 2019-03-17: qty 2

## 2019-03-17 MED ORDER — LORATADINE 10 MG PO TABS
ORAL_TABLET | ORAL | Status: AC
  Filled 2019-03-17: qty 1

## 2019-03-17 MED ORDER — DEXAMETHASONE SODIUM PHOSPHATE 10 MG/ML IJ SOLN
INTRAMUSCULAR | Status: AC
  Filled 2019-03-17: qty 1

## 2019-03-17 MED ORDER — TRASTUZUMAB-DKST CHEMO 150 MG IV SOLR
6.0000 mg/kg | Freq: Once | INTRAVENOUS | Status: AC
  Administered 2019-03-17: 10:00:00 378 mg via INTRAVENOUS
  Filled 2019-03-17: qty 18

## 2019-03-17 MED ORDER — PALONOSETRON HCL INJECTION 0.25 MG/5ML
0.2500 mg | Freq: Once | INTRAVENOUS | Status: AC
  Administered 2019-03-17: 09:00:00 0.25 mg via INTRAVENOUS

## 2019-03-17 MED ORDER — HEPARIN SOD (PORK) LOCK FLUSH 100 UNIT/ML IV SOLN
500.0000 [IU] | Freq: Once | INTRAVENOUS | Status: AC | PRN
  Administered 2019-03-17: 500 [IU]
  Filled 2019-03-17: qty 5

## 2019-03-17 MED ORDER — PALONOSETRON HCL INJECTION 0.25 MG/5ML
INTRAVENOUS | Status: AC
  Filled 2019-03-17: qty 5

## 2019-03-17 MED ORDER — DEXAMETHASONE SODIUM PHOSPHATE 10 MG/ML IJ SOLN
5.0000 mg | Freq: Once | INTRAMUSCULAR | Status: AC
  Administered 2019-03-17: 09:00:00 5 mg via INTRAVENOUS

## 2019-03-17 MED ORDER — SODIUM CHLORIDE 0.9% FLUSH
10.0000 mL | INTRAVENOUS | Status: DC | PRN
  Administered 2019-03-17: 11:00:00 10 mL
  Filled 2019-03-17: qty 10

## 2019-03-17 MED ORDER — LORATADINE 10 MG PO TABS
10.0000 mg | ORAL_TABLET | Freq: Every day | ORAL | Status: DC
  Administered 2019-03-17: 10 mg via ORAL

## 2019-03-17 MED ORDER — SODIUM CHLORIDE 0.9 % IV SOLN
Freq: Once | INTRAVENOUS | Status: AC
  Filled 2019-03-17: qty 250

## 2019-03-17 MED ORDER — SODIUM CHLORIDE 0.9 % IV SOLN
1.0000 mg/m2 | Freq: Once | INTRAVENOUS | Status: AC
  Administered 2019-03-17: 1.7 mg via INTRAVENOUS
  Filled 2019-03-17: qty 3.4

## 2019-03-17 NOTE — Patient Instructions (Signed)
[facility] Discharge Instructions for Patients Receiving Chemotherapy  Today you received the following chemotherapy agents: Trastuzumab, [place]  To help prevent nausea and vomiting after your treatment, we encourage you to take your nausea medication as directed.   If you develop nausea and vomiting that is not controlled by your nausea medication, call the clinic.   BELOW ARE SYMPTOMS THAT SHOULD BE REPORTED IMMEDIATELY:  *FEVER GREATER THAN 100.5 F  *CHILLS WITH OR WITHOUT FEVER  NAUSEA AND VOMITING THAT IS NOT CONTROLLED WITH YOUR NAUSEA MEDICATION  *UNUSUAL SHORTNESS OF BREATH  *UNUSUAL BRUISING OR BLEEDING  TENDERNESS IN MOUTH AND THROAT WITH OR WITHOUT PRESENCE OF ULCERS  *URINARY PROBLEMS  *BOWEL PROBLEMS  UNUSUAL RASH Items with * indicate a potential emergency and should be followed up as soon as possible.  Feel free to call the clinic should you have any questions or concerns. The clinic phone number is [phone].  Please show the [facility] at check-in to the Emergency Department and triage nurse.

## 2019-03-20 ENCOUNTER — Other Ambulatory Visit: Payer: Self-pay

## 2019-03-20 ENCOUNTER — Ambulatory Visit (HOSPITAL_COMMUNITY)
Admission: RE | Admit: 2019-03-20 | Discharge: 2019-03-20 | Disposition: A | Discharge status: Home / Self Care | Payer: Medicaid Other | Source: Ambulatory Visit | Attending: Hematology | Admitting: Hematology

## 2019-03-20 DIAGNOSIS — C50911 Malignant neoplasm of unspecified site of right female breast: Secondary | ICD-10-CM | POA: Diagnosis not present

## 2019-03-20 DIAGNOSIS — Z79899 Other long term (current) drug therapy: Secondary | ICD-10-CM | POA: Diagnosis not present

## 2019-03-20 DIAGNOSIS — C7951 Secondary malignant neoplasm of bone: Secondary | ICD-10-CM

## 2019-03-20 DIAGNOSIS — Z5181 Encounter for therapeutic drug level monitoring: Secondary | ICD-10-CM | POA: Insufficient documentation

## 2019-03-20 NOTE — Progress Notes (Signed)
  Echocardiogram 2D Echocardiogram has been performed.  [name] 03/20/2019, 9:53 AM

## 2019-03-21 ENCOUNTER — Encounter (HOSPITAL_COMMUNITY)
Admission: RE | Admit: 2019-03-21 | Discharge: 2019-03-21 | Disposition: A | Discharge status: Home / Self Care | Payer: Medicaid Other | Source: Ambulatory Visit | Attending: Hematology | Admitting: Hematology

## 2019-03-21 DIAGNOSIS — C50911 Malignant neoplasm of unspecified site of right female breast: Secondary | ICD-10-CM

## 2019-03-21 DIAGNOSIS — Z5111 Encounter for antineoplastic chemotherapy: Secondary | ICD-10-CM | POA: Diagnosis not present

## 2019-03-21 DIAGNOSIS — C50919 Malignant neoplasm of unspecified site of unspecified female breast: Secondary | ICD-10-CM | POA: Diagnosis not present

## 2019-03-21 DIAGNOSIS — C7951 Secondary malignant neoplasm of bone: Secondary | ICD-10-CM | POA: Diagnosis not present

## 2019-03-21 LAB — GLUCOSE, CAPILLARY: Glucose-Capillary: 98 mg/dL (ref 70–99)

## 2019-03-21 MED ORDER — FLUDEOXYGLUCOSE F - 18 (FDG) INJECTION
6.8600 | Freq: Once | INTRAVENOUS | Status: AC | PRN
  Administered 2019-03-21: 6.86 via INTRAVENOUS

## 2019-03-22 ENCOUNTER — Telehealth: Payer: Self-pay

## 2019-03-22 ENCOUNTER — Encounter: Payer: Self-pay | Admitting: Hematology

## 2019-03-22 ENCOUNTER — Ambulatory Visit: Payer: Medicaid Other

## 2019-03-22 ENCOUNTER — Other Ambulatory Visit: Payer: Medicaid Other

## 2019-03-22 NOTE — Telephone Encounter (Signed)
Please call her and schedule her to see me at 8:40 or 11:40am tomorrow.   Truitt Merle MD

## 2019-03-22 NOTE — Telephone Encounter (Signed)
Ms Vota called requesting PET scan results

## 2019-03-23 ENCOUNTER — Telehealth: Payer: Self-pay | Admitting: Pharmacist

## 2019-03-23 ENCOUNTER — Other Ambulatory Visit: Payer: Self-pay

## 2019-03-23 ENCOUNTER — Telehealth: Payer: Self-pay

## 2019-03-23 ENCOUNTER — Encounter: Payer: Self-pay | Admitting: Hematology

## 2019-03-23 ENCOUNTER — Inpatient Hospital Stay (HOSPITAL_BASED_OUTPATIENT_CLINIC_OR_DEPARTMENT_OTHER): Payer: Medicaid Other | Admitting: Hematology

## 2019-03-23 VITALS — BP 120/81 | HR 94 | Temp 97.8°F | Resp 18 | Ht 65.0 in | Wt 136.4 lb

## 2019-03-23 DIAGNOSIS — G4700 Insomnia, unspecified: Secondary | ICD-10-CM

## 2019-03-23 DIAGNOSIS — C50911 Malignant neoplasm of unspecified site of right female breast: Secondary | ICD-10-CM | POA: Diagnosis not present

## 2019-03-23 DIAGNOSIS — C7951 Secondary malignant neoplasm of bone: Secondary | ICD-10-CM

## 2019-03-23 MED ORDER — BUDESONIDE ER 9 MG PO TB24: 9.0000 mg | ORAL_TABLET | Freq: Every day | ORAL | 0 refills | Status: DC

## 2019-03-23 MED ORDER — NERATINIB MALEATE 40 MG PO TABS: 120.0000 mg | ORAL_TABLET | Freq: Every day | ORAL | 0 refills | Status: DC

## 2019-03-23 MED ORDER — NERATINIB MALEATE 40 MG PO TABS: ORAL_TABLET | ORAL | 0 refills | Status: AC

## 2019-03-23 NOTE — Telephone Encounter (Signed)
Oral Oncology Pharmacist Encounter  Received new prescription for Nerlynx (neratinib) for the treatment of metastatic breast cancer, HER2 positive, in conjunction with budesonide, planned duration until disease progression or unacceptable drug toxicity. Capecitabine will not be added to the regimen due to previous patient intolerance.   CMP from 03/15/19 assessed, no relevant lab abnormalities. Prescription dose and frequency assessed.   Current medication list in Epic reviewed, one DDIs with neratinib identified: -Omeprazole: omeprazole may decrease the concentration of neratinib. Recommend evaluating the need for a PPI/acid suppression. If acid suppression is needed, recommend switching to a H2 antagonist (eg, famotidine) or calcium carbonate to manage this DDI. Administer neratinib at least 2 hours before or 10 hours after administration of a histamine H2 receptor antagonist. Or separate the administration of neratinib and antacids by giving neratinib at least 3 hours after the antacid  Prescription has been e-scribed to the [facility] for benefits analysis and approval.  Oral Oncology Clinic will continue to follow for insurance authorization, copayment issues, initial counseling and start date.  Darl Pikes, PharmD, BCPS, BCOP, CPP Hematology/Oncology Clinical Pharmacist [facility] [phone]  03/23/2019 1:16 PM

## 2019-03-23 NOTE — Telephone Encounter (Signed)
Left vm for patient to call Dr. Ernestina Penna office

## 2019-03-23 NOTE — Telephone Encounter (Signed)
Multiple attempts to contact patient for office vist today.  I left a message that Dr. Burr Medico needs to see her in the office today.  An appoitnemtn for [phone] has been made and Juliann Pulse will be here for interpretive services.

## 2019-03-23 NOTE — Telephone Encounter (Signed)
Oral Oncology Patient Advocate Encounter  After completing a benefits investigation, prior authorization for Nerlynx is not required at this time through [facility].  Patient's copay is $3.     [place] [facility] [place] Phone [phone] Fax [phone] 03/23/2019 2:14 PM

## 2019-03-23 NOTE — Progress Notes (Signed)
[place]   [phone] [phone]   Clinic Follow up Note   Patient Care Team: [name], [name], [name] as PCP - General (Physician Assistant) [name], MD as Consulting Physician (Orthopedic Surgery)  Date of Service:  03/23/2019  CHIEF COMPLAINT: F/u ofmetastatic breast cancer and review PET scan   SUMMARY OF ONCOLOGIC HISTORY: Oncology History Overview Note   Cancer Staging No matching staging information was found for the patient.     [name], right ([place])  11/1997 Cancer Diagnosis   Patient had 1.4 cm poorly differentiated right breast carcinoma diagnosed in Nov 1999 at age 24, 1/7 axillary nodes involved, ER/PR and HER 2 positive. She had mastectomy with the 7 axillary node evaluation, 4 cycles of adriamycin/ cytoxan followed by taxotere, then five years of tamoxifen thru June 2005 (no herceptin in 1999). She had bilateral oophorectomy in May 2005. She was briefly on aromatase inhibitor after tamoxifen, but discontinued this herself due to poor tolerance.   04/04/2015 Genetic Testing   Negative genetic testing on the breast/ovarian cancer pnael.  The Breast/Ovarian gene panel offered by [facility] includes sequencing and rearrangement analysis for the following 20 genes:  ATM, BARD1, BRCA1, BRCA2, BRIP1, CDH1, CHEK2, EPCAM, FANCC, MLH1, MSH2, MSH6, NBN, PALB2, PMS2, PTEN, RAD51C, RAD51D, TP53, and XRCC2.   06/26/2015 Pathology Results   Initial Path Report Bone, curettage, Left femur met, intramedullary subtroch tissue - METASTATIC ADENOCARCINOMA.  Estrogen Receptor: 95%, POSITIVE, STRONG STAINING INTENSITY Progesterone Receptor: 2%, POSITIVE, STRONG  HER2 (+), IHC 3+   06/26/2015 Surgery   Biopsy of metastatic tissue and stabilization with Affixus trochanteric nail, proximal and distal interlock of left femur by Dr. [name]    06/28/2015 Imaging   CT Chest w/ Contrast 1. Extensive right internal  mammary chain lymphadenopathy consistent with metastatic breast cancer. 2. Lytic metastases involving the posterior elements at T1 with probable nondisplaced pathologic fracture of the spinous process. 3. No other evidence of thoracic metastatic disease. 4. Trace bilateral pleural effusions with associated bibasilar atelectasis. 5. Indeterminate right thyroid nodule, not previously imaged. This could be evaluated with thyroid ultrasound as clinically warranted.   06/29/2015 -  Chemotherapy   Zometa started monthly on 06/29/15 and switched to q70month from 01/08/17   07/01/2015 Imaging   Bone Scan 1. Increased activity noted throughout the left femur. Although metastatic disease cannot be excluded, these changes are most likely secondary to prior surgery. 2. No other focal abnormalities identified to suggest metastatic disease.   07/12/2015 - 07/26/2015 Radiation Therapy   Left femur, 30 Gy in 10 fractions by Dr. [name]   07/14/2015 Initial Diagnosis   [name], right [place]   07/19/2015 Imaging   Initial PET Scan 1. Severe multifocal osseous metastatic disease, much greater than anticipated based on prior imaging. 2. Multifocal nodal metastases involving the right internal mammary, prevascular and subpectoral lymph nodes. No axillary pulmonary involvement identified. 3. No distant extra osseous metastases.   07/30/2015 - 11/29/2015 Chemotherapy   Docetaxel, Herceptin and Perjeta every 3 weeks X6 cycles, pt tolerated moderately well, restaging scan showed excellent response    09/18/2015 Imaging   CT Head w/wo Contrast 1. No acute intracranial abnormality or significant interval change. 2. Stable minimal periventricular white matter hypoattenuation on the right. This may reflect a remote ischemic injury. 3. No evidence for metastatic disease to the brain. 4. No focal soft tissue lesion to explain the patient's tenderness.  10/30/2015 Imaging   CT Abdomen Pelvis  w/ Contrast 1. Few mildly prominent fluid-filled loops of small bowel scattered throughout the abdomen, with intraluminal fluid density within the distal colon. Given the provided history, findings are suggestive of acute enteritis/diarrheal illness. 2. No other acute intra-abdominal or pelvic process identified. 3. Widespread osseous metastatic disease, better evaluated on most recent PET-CT from 07/19/2015. No pathologic fracture or other complication. 4. 11 mm hypodensity within the left kidney. This lesion measures intermediate density, and is indeterminate. While this lesion is similar in size relative to recent studies, this is increased in size relative to prior study from 2012. Further evaluation with dedicated renal mass protocol CT and/or MRI is recommended for complete characterization.   12/2015 - 05/14/2016 Chemotherapy   Maintenance Herceptin and pejeta every 3 weeks stopped on 05/14/16 due to disease progression   12/25/2015 Imaging   Restaging PET Scan 1. Markedly improved skeletal activity, with only several faint foci of residual accentuated metabolic activity, but resolution of the vast majority of the previously extensive osseous metastatic disease. 2. Resolution of the prior right internal mammary and right prevascular lymph nodes. 3. Residual subcutaneous edema and edema along fascia planes in the left upper thigh. This remains somewhat more than I would expect for placement of an IM nail 6 weeks ago. If there is leg swelling further down, consider Doppler venous ultrasound to rule out left lower extremity DVT. I do not perceive an obvious difference in density between the pelvic and common femoral veins on the noncontrast CT data. 4. Chronic right maxillary and right sphenoid sinusitis.   01/16/2016 - 10/20/2017 Anti-estrogen oral therapy   Letrozole 2.5 mg daily. She was switched to exemestane due to disease progression on 02/08/17. Stopped on 10/20/2017 when treatment changed to  chemo    03/01/2016 Miscellaneous   Patient presented to ED following a fall; she reports she landed on her back and is now experiencing back pain. The patient was evaluated and discharge home same day with pain control.   05/01/2016 Imaging   CT CAP IMPRESSION: 1. Stable exam.  No new or progressive disease identified. 2. No evidence for residual or recurrent adenopathy within the chest. 3. Stable bone metastasis.   05/01/2016 Imaging   BONE SCAN IMPRESSION: 1. Small focal uptake involving the anterior rib ends of the left second and right fourth ribs, new since the prior bone scan. The location of this uptake is more suggestive of a traumatic or inflammatory etiology as opposed to metastatic disease. No other evidence of metastatic disease. 2. There are other areas of stable uptake which are most likely degenerative/reactive, including right shoulder and cervical spine uptake and uptake along the right femur adjacent to the ORIF hardware.   05/20/2016 Imaging   NM PET Skull to Thigh IMPRESSION: 1. There multiple metastatic foci in the bony pelvis, including some new abnormal foci compare to the prior PET-CT, compatible with mildly progressive bony metastatic disease. Also, a left T11 vertebral hypermetabolic metastatic lesion is observed, new compared to the prior exam. 2. No active extraosseous malignancy is currently identified. 3. Right anterior fourth rib healing fracture, appears be   06/04/2016 - 01/08/2017 Chemotherapy   Second line chemotherapy Kadcyla every 3 weeks started on 06/04/16 and stopped on 01/08/17 due to mixed response.     10/05/2016 Imaging   CT CAP and Bone Scan:  Bones: left intramedullary rod and left femoral head neck screw in place. In the proximal diaphysis of the left femur there  is cortical thickening and irregularity. No lytic or blastic bone lesions. No fracture or vertebral endplate destruction.   No definite evidence of recurrent disease in  the chest, abdomen, or pelvis.   10/28/2016 Imaging   CT A/P IMPRESSION: No acute process demonstrated in the abdomen or pelvis. No evidence of bowel obstruction or inflammation.   11/04/2016 Imaging   DG foot complete left: IMPRESSION: No fracture or dislocation.  No soft tissue abnormality   11/23/2016 Imaging   CT RIGHT FEMUR IMPRESSION: Negative CT scan of the right thigh.  No visible metastatic disease.  CT LEFT FEMUR IMPRESSION: 1. Postsurgical changes related to prior cephalomedullary rod fixation of the left femur with linear lucency along the anterolateral proximal femoral cortex at level of the lesser trochanter, suspicious for nondisplaced fracture. 2. Slightly permeative appearance of the proximal left femoral cortex at the site of known prior osseous metastases is largely unchanged. 3. Unchanged patchy lucency and sclerosis along the anterior acetabulum, which appears to correspond with an area of faint uptake on the prior PET-CT, suspicious for osseous metastasis. These results will be called to the ordering clinician or representative by the Radiologist Assistant, and communication documented in the PACS or zVision Dashboard.     01/27/2017 Imaging   IMPRESSION: 1. Mixed response to osseous metastasis. Some hypermetabolic lesions are new and increasingly hypermetabolic, while another index lesion is decreasingly hypermetabolic. 2. No evidence of hypermetabolic soft tissue metastasis.    02/08/2017 - 10/19/2017 Antibody Plan   -Herceptin every 3 week since 02/08/17 -Oral Lanpantinib 1548m and decreased to 5034mdue to diarrhea and depression. Due to disease progression she swithced to Verzenio 15013mn 05/04/17. She did not toelrate well so she was switched to Ibrance 100m39m 05/31/17. Due to her neutropenia, Ibrance dose reduced to 75 mg daily, 3 weeks on, one-week off, stopped on 10/20/2017 due to disease progression.    04/30/2017 PET scan   IMPRESSION: 1.  Primarily increase in hypermetabolism of osseous metastasis. An isolated right acetabular lesion is decreased in hypermetabolism since the prior exam. 2. No evidence of hypermetabolic soft tissue metastasis. 3. Similar non FDG avid right-sided thyroid nodule, favoring a benign etiology. Recommend attention on follow-up.   08/30/2017 PET scan   08/30/2017 PET Scan  IMPRESSION: 1. Worsening osseous metastatic disease. 2. Focal hypermetabolism in the central spinal canal the L1 level, similar to the prior exam. Metastatic implant cannot be excluded. 3. Slight enlargement of a right thyroid nodule which now appears more cystic in character. Consider further evaluation with thyroid ultrasound. If patient is clinically hyperthyroid, consider nuclear medicine thyroid uptake and scan   08/30/2017 Progression   08/30/2017 PET Scan  IMPRESSION: 1. Worsening osseous metastatic disease. 2. Focal hypermetabolism in the central spinal canal the L1 level, similar to the prior exam. Metastatic implant cannot be excluded. 3. Slight enlargement of a right thyroid nodule which now appears more cystic in character. Consider further evaluation with thyroid ultrasound. If patient is clinically hyperthyroid, consider nuclear medicine thyroid uptake and scan   09/29/2017 - 10/11/2017 Radiation Therapy   Pt received 27 Gy in 9 fractions directed at the areas of significant uptake noted on recent bone scan the right pelvis and right proximal femur, with Dr, KinaSondra Come10/09/2017 - 04/07/2018 Chemotherapy   -Herceptin every 3 weeks starting 02/08/17, perjeta added on 10/20/2017  -weekly Taxol started on 10/21/2017. Will reduce Taxol to 2 weeks on, 1 week off starting on 02/04/17.  Stoped due to mild  disease progression   10/27/2017 Imaging   10/27/2017 CT AP IMPRESSION: 1. No acute abnormality. No evidence of bowel obstruction or acute bowel inflammation. Normal appendix. 2. Stable patchy sclerotic osseous  metastases throughout the visualized skeleton. No new or progressive metastatic disease in the abdomen or pelvis.   01/11/2018 Imaging   Whole Body Bone Scan 01/11/18  IMPRESSION: 1. Multifocal osseous metastases as described. 2. Right scapular metastasis. 3. Right superior thoracic spinous process. 4. Right sacral ala and right L5 metastases. 5. Right acetabular metastasis. 6. Right proximal femur metastasis. 7. Diffuse metastases throughout the left femur. 8. Anterior right fourth rib metastasis.   01/11/2018 Imaging   CT CAP W Contrast 01/11/18  IMPRESSION: 1. Similar-appearing osseous metastatic disease. 2. No evidence for progressed metastatic disease in the chest, abdomen or pelvis.   03/30/2018 Imaging   Bone Scan 03/30/18  IMPRESSION: Stable multifocal bony metastatic disease.   03/30/2018 Imaging    CT CAP W Contrast 03/30/18 IMPRESSION: 1. New mild contiguous wall thickening with associated mucosal hyperenhancement throughout the left colon and rectum, suggesting nonspecific infectious or inflammatory proctocolitis. Differential includes C diff colitis. 2. Stable faint patchy sclerosis in the iliac bones and lumbar vertebral bodies. No new or progressive osseous metastatic disease by CT. Please note that the osseous lesions are relatively occult by CT and would be better evaluated by PET-CT, as clinically warranted. 3. No evidence of extra osseous metastatic disease.  These results will be called to the ordering clinician or representative by the Radiologist Assistant, and communication documented in the PACS or zVision Dashboard.   04/15/2018 - 06/16/2018 Chemotherapy   Enhertu q3weeks starting 04/15/18. Stopped Enhertu after cycle 3 on 05/27/18 due to poor toleration due to poor toleration with anorexia, weight loss, N&V, diarrhea   04/21/2018 PET scan   PET 04/21/18  IMPRESSION: 1. Overall marked interval improvement in bony hypermetabolic disease. All of the  previously identified hypermetabolic osseous metastases have decreased in hypermetabolism in the interval. There is a new tiny focus of hypermetabolism in the right aspect of the L3 vertebral body that appears to correspond to a new 8 mm lucency, raising concern for new metastatic disease. Close attention on follow-up recommended. 2. Focal hypermetabolism in the central spinal canal at the level of L1 previously has resolved. 3. No hypermetabolic soft tissue disease on today's study. 4. Right thyroid nodule seen previously has decreased in the interval.    06/16/2018 Imaging   CT LUMBAR SPINE WO CONTRAST 06/16/18 IMPRESSION: 1. Transitional lumbosacral anatomy. Correlation with radiographs is recommended prior to any operative intervention. 2. Suspicion of a right L3 vertebral body metastasis on the April PET-CT is not evident by CT. Stable degenerative versus metastatic lesion at the right L5 superior endplate. Small but increased right iliac bone metastasis since March. Lumbar MRI would best characterize lumbar metastatic disease (without contrast if necessary). 3. Suspect L2-L3 left subarticular segment disc herniation. Query left L2 radiculitis. 4. Stable appearance of rightward L4-L5 disc degeneration which could affect the right foramen and right lateral recess.   06/17/2018 - 11/16/2018 Chemotherapy   Restart Taxol 2 weeks on/1 week off with Herceptin and Perjeta q3weeks on 06/17/18. Perjeta was stopped after C5 due to diarrhea. Stopped Taxol after 11/16/18 due to disease progression to L3.    07/06/2018 PET scan   PET  IMPRESSION: 1. Near total resolution of hypermetabolic activity associated with skeletal metastasis. Very mild activity associated with the LEFT iliac bone which is similar to background blood pool  activity. 2. No new or progressive disease.   11/21/2018 PET scan   IMPRESSION: 1. New focal hypermetabolic lesion eccentric to the right in the L3 vertebral body,  maximum SUV 6.1 compatible with a new focus of active malignancy. 2. Very low-grade activity similar to blood pool in previous existing mildly sclerotic metastatic lesions including the left iliac bone lesion. Some of the mildly sclerotic lesions are not hypermetabolic compatible with effectively treated bony metastatic disease. 3. No hypermetabolic soft tissue lesions are identified.     11/30/2018 -  Chemotherapy   Continue Herceptin q3weeks    12/12/2018 - 12/17/2018 Chemotherapy   Tucatinib (TUKYSA) 6 tabs BID as a next line therapy starting 12/12/18. Stopped 12/17/18 duet o N&V, poor appetite and HA    12/21/2018 - 03/17/2019 Chemotherapy   IV Eribulin on day 1, 8 every 21 days starting 12/21/18. Stopped 03/17/19 due to mixed response to treatment.    03/21/2019 PET scan   IMPRESSION: 1. Mixed response to therapy of osseous metastasis. Although a right-sided L4 lesion demonstrates decreased hypermetabolism today, new left iliac and right ischial hypermetabolic lesions are seen. 2. No evidence of hypermetabolic soft tissue metastasis.      Chemotherapy   PENDING start of Neratinib (Nerlynx) with budesonide next week.       CURRENT THERAPY:  -Zometa q91month  -Continue Herceptin q3weeks -PENDING start of Neratinib (Nerlynx) with budesonide next week.   INTERVAL HISTORY:  Debra FALKis here for a follow up and review PET scan. She presents to the clinic with her interpretor. She notes she has been stable lately. She has stable left or right leg pain which can present in her right shoulder. She notes she is still active at home.     REVIEW OF SYSTEMS:   Constitutional: Denies fevers, chills or abnormal weight loss Eyes: Denies blurriness of vision Ears, nose, mouth, throat, and face: Denies mucositis or sore throat Respiratory: Denies cough, dyspnea or wheezes Cardiovascular: Denies palpitation, chest discomfort or lower extremity swelling Gastrointestinal:  Denies  nausea, heartburn or change in bowel habits Skin: Denies abnormal skin rashes MSK: (+) Left hip pain and right shoulder and hip pain Lymphatics: Denies new lymphadenopathy or easy bruising Neurological:Denies numbness, tingling or new weaknesses Behavioral/Psych: Mood is stable, no new changes  All other systems were reviewed with the patient and are negative.  MEDICAL HISTORY:  Past Medical History:  Diagnosis Date  . Abnormal Pap smear   . Anxiety   . Breast cancer [place]   [place]   breast-s/p mastectomy, chemo, rad  . CIN I (cervical intraepithelial neoplasia I) 2003   by colpo  . Congenital deafness   . Deaf   . Depression   . Port-A-Cath in place 2017   for chemotherapy  . Radiation 07/12/15-07/26/15   left femur 30 Gy  . S/P bilateral oophorectomy     SURGICAL HISTORY: Past Surgical History:  Procedure Laterality Date  . ABDOMINAL HYSTERECTOMY    . INTRAMEDULLARY (IM) NAIL INTERTROCHANTERIC Left 06/26/2015   Procedure: LEFT BIOMET LONG AFFIXS NAIL;  Surgeon: MMarybelle Killings MD;  [facility]  Service: Orthopedics;  Laterality: Left;  . IR GENERIC HISTORICAL  08/06/2015   IR [place] LEFT 08/06/2015 GAletta Edouard MD [facility]  . IR GENERIC HISTORICAL  08/06/2015   IR FLUORO GUIDE CV LINE LEFT 08/06/2015 GAletta Edouard MD [facility]  . IR GENERIC HISTORICAL  03/05/2016   IR CV LINE INJECTION 03/05/2016  Markus Daft, MD [facility]  . LEFT HEART CATH AND CORONARY ANGIOGRAPHY N/A 12/13/2017   Procedure: LEFT HEART CATH AND CORONARY ANGIOGRAPHY;  Surgeon: Leonie Man, MD;  Location: [facility];  Service: Cardiovascular;  Laterality: N/A;  . MASTECTOMY     right breast  . MASTECTOMY    . OVARIAN CYST REMOVAL    . RADIOLOGY WITH ANESTHESIA Left 06/25/2015   Procedure: MRI OF LEFT HIP WITH OR WITHOUT CONTRAST;  Surgeon: Medication Radiologist, MD;  Location: [facility];  Service: Radiology;  Laterality: Left;  DR. MCINTYRE/MRI  . TUBAL  LIGATION      I have reviewed the social history and family history with the patient and they are unchanged from previous note.  ALLERGIES:  is allergic to promethazine hcl; chlorhexidine; and diphenhydramine hcl.  MEDICATIONS:  Current Outpatient Medications  Medication Sig Dispense Refill  . albuterol (PROAIR HFA) 108 (90 Base) MCG/ACT inhaler Inhale 2 puffs into the lungs every 6 (six) hours as needed for wheezing or shortness of breath.    Marland Kitchen albuterol (PROVENTIL HFA;VENTOLIN HFA) 108 (90 Base) MCG/ACT inhaler Inhale 2 puffs into the lungs every 6 (six) hours as needed for wheezing or shortness of breath. 1 Inhaler 0  . ALPRAZolam (XANAX) 0.25 MG tablet Take 1 tablet (0.25 mg total) by mouth 2 (two) times daily as needed for anxiety. 30 tablet 0  . diphenoxylate-atropine (LOMOTIL) 2.5-0.025 MG tablet Take 1-2 tablets by mouth 4 (four) times daily as needed for diarrhea or loose stools. 30 tablet 1  . lidocaine-prilocaine (EMLA) cream Apply 1 application topically as needed. Apply to Porta-Cath 1-2 hours prior to access as directed. (Patient taking differently: Apply 1 application topically as needed (To Porta-Cath 1-2 hours prior to access as directed). ) 90 g 2  . magnesium oxide (MAG-OX) 400 (241.3 Mg) MG tablet Take 1 tablet (400 mg total) by mouth daily. 30 tablet 3  . methocarbamol (ROBAXIN) 500 MG tablet Take 1 tablet (500 mg total) by mouth 2 (two) times daily. (Patient taking differently: Take 500 mg by mouth 2 (two) times daily as needed for muscle spasms. ) 20 tablet 0  . Neratinib Maleate (NERLYNX) 40 MG tablet Take 3 tablets (120 mg total) by mouth daily for 7 days, THEN 4 tablets (160 mg total) daily for 7 days, THEN 5 tablets (200 mg total) daily for 7 days, THEN 6 tablets (240 mg total) daily for 7 days. Take with food. Increase dose as tolerated.. 126 tablet 0  . omeprazole (PRILOSEC) 20 MG capsule Take 1 capsule (20 mg total) by mouth daily. 30 capsule 3  . ondansetron (ZOFRAN)  8 MG tablet Take 1 tablet (8 mg total) by mouth every 8 (eight) hours as needed for nausea or vomiting. 45 tablet 1  . oxyCODONE (ROXICODONE) 5 MG immediate release tablet Take 1 tablet (5 mg total) by mouth every 8 (eight) hours as needed for severe pain. 60 tablet 0  . potassium chloride (KLOR-CON) 10 MEQ tablet Take 3 tablets (30 mEq total) by mouth daily. 90 tablet 0  . prochlorperazine (COMPAZINE) 10 MG tablet Take 1 tablet (10 mg total) by mouth 2 (two) times daily as needed for nausea or vomiting. 10 tablet 0  . zolpidem (AMBIEN) 5 MG tablet Take 1-2 tablets (5-10 mg total) by mouth at bedtime as needed. 60 tablet 0   No current facility-administered medications for this visit.   Facility-Administered Medications Ordered in Other Visits  Medication Dose Route Frequency Provider  Last Rate Last Admin  . potassium chloride SA (K-DUR) CR tablet 40 mEq  40 mEq Oral Once Truitt Merle, MD      . sodium chloride 0.9 % 1,000 mL with potassium chloride 10 mEq infusion   Intravenous Continuous Gordy Levan, MD   Stopped at 09/10/15 1653  . sodium chloride 0.9 % injection 10 mL  10 mL Intravenous PRN Livesay, Lennis P, MD        PHYSICAL EXAMINATION: ECOG PERFORMANCE STATUS: 1 - Symptomatic but completely ambulatory  Vitals:   03/23/19 1155  BP: 120/81  Pulse: 94  Resp: 18  Temp: 97.8 F (36.6 C)  SpO2: 100%   Filed Weights   03/23/19 1155  Weight: 136 lb 6.4 oz (61.9 kg)    Due to COVID19 we will limit examination to appearance. Patient had no complaints.  GENERAL:alert, no distress and comfortable SKIN: skin color normal, no rashes or significant lesions EYES: normal, Conjunctiva are pink and non-injected, sclera clear  NEURO: alert & oriented x 3 with fluent speech   LABORATORY DATA:  I have reviewed the data as listed CBC Latest Ref Rng & Units 03/15/2019 03/07/2019 02/22/2019  WBC 4.0 - 10.5 K/uL 6.5 1.6(L) 4.9  Hemoglobin 12.0 - 15.0 g/dL 11.6(L) 10.6(L) 10.0(L)  Hematocrit  36.0 - 46.0 % 34.1(L) 31.9(L) 30.6(L)  Platelets 150 - 400 K/uL 212 222 140(L)     CMP Latest Ref Rng & Units 03/15/2019 03/07/2019 02/22/2019  Glucose 70 - 99 mg/dL 95 113(H) 89  BUN 6 - 20 mg/dL 14 11 8   Creatinine 0.44 - 1.00 mg/dL 0.84 0.80 0.81  Sodium 135 - 145 mmol/L 142 142 144  Potassium 3.5 - 5.1 mmol/L 3.7 3.3(L) 3.6  Chloride 98 - 111 mmol/L 105 107 108  CO2 22 - 32 mmol/L 29 25 26   Calcium 8.9 - 10.3 mg/dL 9.5 9.1 9.0  Total Protein 6.5 - 8.1 g/dL 6.6 6.3(L) 6.2(L)  Total Bilirubin 0.3 - 1.2 mg/dL 1.7(H) 1.3(H) 1.2  Alkaline Phos 38 - 126 U/L 72 60 80  AST 15 - 41 U/L 21 19 26   ALT 0 - 44 U/L 33 38 40      RADIOGRAPHIC STUDIES: I have personally reviewed the radiological images as listed and agreed with the findings in the report. No results found.   ASSESSMENT & PLAN:  TROY HARTZOG is a 54 y.o. 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 She had right mastectomy, 4 cycles of AC-T and 5 years of Tamoxifen/AI.  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 She has received Target RT. She had excellent response to First-line Docetaxel/Herceptin/Perjeta and AI but progressed on maintenance therapy. She had mixed response on second line Kadcyla, progressed on third-line Ibrance/Herceptin and poorly tolerated Enhertu.  -She triedTucatinib (TUKYSA)with low doseon 12/12/18 however due to N&V, poor appetite and HA she stopped on 12/17/18 after trying twice. -I started her on next lineivchemoEribulin on day 1, 8, every 21 days on 12/21/18, along with Herceptin every 3 weeks. -I personally reviewed her PET images from 03/21/19 with pt which shows mixed response to therapy of bone metastasis (imporoved L4 lesion, new pelvic bone lesions). No evidence of hypermetabolic soft tissue metastasis.  -I discussed her disease is starting to  develop resistance to her chemo treatment. I discussed next line treatment such as Neratinib, and newly  addporved Margetuzimab which we do not have in our clinic yet. I would recommend Margetuzimab when it's available.  -I recommend her switch chemo Eribulin to Neratinib(Nerlynx). I discussed the side effects, especially diarrhea which is main side effect especially the first month so we titrate up and it is taken with Budesonide. I gave print out of Neratinib, she is agreeable. -We will continue with Herceptin -If she is able to tolerate neratinib, I will add on tamoxifen -She previously could not tolerate Xeloda, which is usually used in combination with Neratinib in metastatic setting  -f/u in 2 weeks    2. N&V  -Secondary to chemo  -Continue prophylacticCompazineor Zofran.I offeredsublingual Zofran,she declined for now. -Aloxi was added with C2 Erbilin. -Overall resolved lately on Eribulin  3.Body pain, secondary to bone mets, Hypocalcemia, leg cramps  -Since her cancer diagnosis,she has had b/l hip, shoulder and chest painat different time points, likely related to her bone mets -She declined Gabapentin given risk of suicidal thoughts. -She is being treated with Zometa q51month. She does have hypocalcemia with leg cramps secondary to Zometa.  -She will continue oral calcium and Vit Dand Prilosec for Acid Reflux. -Current pain includes, lower back, left upper legand recently her right leg and shoulder. She has increased use to 5-119m1-2 times a day.Her PET from 03/21/19 does indicate disease progression in her b/l pelvic lesions.  -Since recent fall and soreness since she increased oxycodone 47m24mo 1-2 tabs TID. -She is overdue for Zometa, she will proceed with injection on 04/07/19 with her next Herceptin. She denies nay recent dental issues.   4.Congenital deafness  5.Mild anemia, secondary to chemotherapy and malignancy -She will start with oral iron daily for 1-2  months and then stop. She will also start daily women's vitamin. -Stable mild anemia   6.Hypokalemia, Diarrhea -She developed diarrhea s/p cycle 3 Perjeta.Has been d/c -Ipreviously started her onMag 400m21mily and Oral potassium 10me13m times a day. If she cannot take 3 tabs I encouraged her to increase potassium in diet.  7.Goal of care discussion -she understand her goal of care is palliative to prolong her life -she is full code for now  8.Insomnia -She cannot take Benadryl due to allergy reaction. She has tried trazodone with no relief.  -She is on Xanax and Ambien 1-2 tabs nightly.I advised her to not take them at the same time.   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 She declined using ice bags using infusion -stable. Will watch on chemo.  10. Neutropenia, secondary to chemo -Seen after C4D1 with ANC 0.4 on 03/07/19 -She has been on Onpro on day 8 which has not been enough. I recommend Granix injections the day after chemo, she is apprehensive due to needles and frequent visits.  -Will try to dose reduce chemo Eribulin first and if not enough will reduce Eribulin to every 2 weeks with continued Onpro patch. She is agreeable.   11. Mild hyperbilirubinemia -likely second to chemotherapy, fluctuates  -Will monitor closely    PLAN: -PET scan reviewed, shows mixed response, overall with disease progression. Will d/c Eribulin -I called in Neratinib to start next week when she receives it. Will start at 120mg 647my, and increase 40mg e40m week, until full dose 240mg da89m  I will call in budesonide also  -Continue Herceptin q3weeks, next due in 2 weeks  -Lab, flush, f/u and Herceptin and Zometa on 04/07/19   No problem-specific Assessment & Plan notes found for this encounter.   No orders of  the defined types were placed in this encounter.  All questions were answered. The patient knows to call the clinic with any problems, questions or concerns. No  barriers to learning was detected. The total time spent in the appointment was 40 minutes.     Truitt Merle, MD 03/23/2019   I, Joslyn Devon, am acting as scribe for Truitt Merle, MD.   I have reviewed the above documentation for accuracy and completeness, and I agree with the above.

## 2019-03-23 NOTE — Telephone Encounter (Signed)
I spoke with Ms Libert on the phone. She will com on Monday  03/27/2019 for lab and zometa.  She will also speak with Nuala Alpha regarding new oral treatment.  Interpretor arranged for monday 3/15 appointments.

## 2019-03-24 ENCOUNTER — Telehealth: Payer: Self-pay | Admitting: Hematology

## 2019-03-24 ENCOUNTER — Other Ambulatory Visit: Payer: Medicaid Other

## 2019-03-24 ENCOUNTER — Ambulatory Visit (HOSPITAL_COMMUNITY): Payer: Medicaid Other

## 2019-03-24 ENCOUNTER — Ambulatory Visit: Payer: Medicaid Other

## 2019-03-24 NOTE — Telephone Encounter (Signed)
Cancelled appts per 3/11 los

## 2019-03-27 ENCOUNTER — Inpatient Hospital Stay: Payer: Medicaid Other

## 2019-03-27 ENCOUNTER — Other Ambulatory Visit: Payer: Self-pay

## 2019-03-27 ENCOUNTER — Telehealth: Payer: Self-pay | Admitting: Pharmacist

## 2019-03-27 ENCOUNTER — Other Ambulatory Visit: Payer: Self-pay | Admitting: Hematology

## 2019-03-27 VITALS — BP 111/59 | HR 86 | Temp 97.8°F | Resp 16

## 2019-03-27 DIAGNOSIS — C7951 Secondary malignant neoplasm of bone: Secondary | ICD-10-CM

## 2019-03-27 DIAGNOSIS — Z95828 Presence of other vascular implants and grafts: Secondary | ICD-10-CM

## 2019-03-27 DIAGNOSIS — C50911 Malignant neoplasm of unspecified site of right female breast: Secondary | ICD-10-CM

## 2019-03-27 DIAGNOSIS — R140 Abdominal distension (gaseous): Secondary | ICD-10-CM

## 2019-03-27 DIAGNOSIS — Z5112 Encounter for antineoplastic immunotherapy: Secondary | ICD-10-CM | POA: Diagnosis not present

## 2019-03-27 LAB — CMP (CANCER CENTER ONLY)
ALT: 32 U/L (ref 0–44)
AST: 21 U/L (ref 15–41)
Albumin: 3.7 g/dL (ref 3.5–5.0)
Alkaline Phosphatase: 59 U/L (ref 38–126)
Anion gap: 10 (ref 5–15)
BUN: 13 mg/dL (ref 6–20)
CO2: 25 mmol/L (ref 22–32)
Calcium: 8.7 mg/dL — ABNORMAL LOW (ref 8.9–10.3)
Chloride: 108 mmol/L (ref 98–111)
Creatinine: 0.87 mg/dL (ref 0.44–1.00)
GFR, Est AFR Am: >60 mL/min (ref >60)
GFR, Estimated: >60 mL/min (ref >60)
Glucose, Bld: 116 mg/dL — ABNORMAL HIGH (ref 70–99)
Potassium: 3.3 mmol/L — ABNORMAL LOW (ref 3.5–5.1)
Sodium: 143 mmol/L (ref 135–145)
Total Bilirubin: 1.8 mg/dL — ABNORMAL HIGH (ref 0.3–1.2)
Total Protein: 6.2 g/dL — ABNORMAL LOW (ref 6.5–8.1)

## 2019-03-27 LAB — CBC WITH DIFFERENTIAL (CANCER CENTER ONLY)
Abs Immature Granulocytes: 0.02 10*3/uL (ref 0.00–0.07)
Basophils Absolute: 0 10*3/uL (ref 0.0–0.1)
Basophils Relative: 1 %
Eosinophils Absolute: 0 10*3/uL (ref 0.0–0.5)
Eosinophils Relative: 1 %
HCT: 32.9 % — ABNORMAL LOW (ref 36.0–46.0)
Hemoglobin: 10.9 g/dL — ABNORMAL LOW (ref 12.0–15.0)
Immature Granulocytes: 1 %
Lymphocytes Relative: 22 %
Lymphs Abs: 1 10*3/uL (ref 0.7–4.0)
MCH: 29.8 pg (ref 26.0–34.0)
MCHC: 33.1 g/dL (ref 30.0–36.0)
MCV: 89.9 fL (ref 80.0–100.0)
Monocytes Absolute: 0.3 10*3/uL (ref 0.1–1.0)
Monocytes Relative: 6 %
Neutro Abs: 3.1 10*3/uL (ref 1.7–7.7)
Neutrophils Relative %: 69 %
Platelet Count: 200 10*3/uL (ref 150–400)
RBC: 3.66 MIL/uL — ABNORMAL LOW (ref 3.87–5.11)
RDW: 14.2 % (ref 11.5–15.5)
WBC Count: 4.4 10*3/uL (ref 4.0–10.5)
nRBC: 0 % (ref 0.0–0.2)

## 2019-03-27 MED ORDER — ZOLEDRONIC ACID 4 MG/5ML IV CONC
4.0000 mg | Freq: Once | INTRAVENOUS | Status: DC
  Filled 2019-03-27: qty 5

## 2019-03-27 MED ORDER — GABAPENTIN 100 MG PO CAPS: 100.0000 mg | ORAL_CAPSULE | Freq: Every day | ORAL | 0 refills | Status: DC

## 2019-03-27 MED ORDER — ALTEPLASE 2 MG IJ SOLR
2.0000 mg | Freq: Once | INTRAMUSCULAR | Status: DC | PRN
  Filled 2019-03-27: qty 2

## 2019-03-27 MED ORDER — ZOLEDRONIC ACID 4 MG/100ML IV SOLN
INTRAVENOUS | Status: AC
  Filled 2019-03-27: qty 100

## 2019-03-27 MED ORDER — SODIUM CHLORIDE 0.9% FLUSH
10.0000 mL | INTRAVENOUS | Status: DC | PRN
  Administered 2019-03-27: 10 mL via INTRAVENOUS
  Filled 2019-03-27: qty 10

## 2019-03-27 MED ORDER — SODIUM CHLORIDE 0.9 % IV SOLN
INTRAVENOUS | Status: DC
  Filled 2019-03-27: qty 250

## 2019-03-27 MED ORDER — ZOLEDRONIC ACID 4 MG/100ML IV SOLN
4.0000 mg | Freq: Once | INTRAVENOUS | Status: AC
  Administered 2019-03-27: 4 mg via INTRAVENOUS

## 2019-03-27 MED ORDER — HEPARIN SOD (PORK) LOCK FLUSH 100 UNIT/ML IV SOLN
500.0000 [IU] | Freq: Once | INTRAVENOUS | Status: AC | PRN
  Administered 2019-03-27: 500 [IU] via INTRAVENOUS
  Filled 2019-03-27: qty 5

## 2019-03-27 MED FILL — NERLYNX 40 MG TABLET: 40 | 28 days supply | Qty: 126 | Fill #0

## 2019-03-27 NOTE — Telephone Encounter (Signed)
Oral Chemotherapy Pharmacist Encounter  Hand delivered Nerlynx from [facility] to [name] during her infusion appointment. Reviewed with her the regular refill process. She knows [facility] will reach out to her 5-7 days before a refill is needed.  Patient Education I spoke with patient for overview of new oral chemotherapy medication: Nerlynx (neratinib) for the treatment of metastatic breast cancer, HER2 positive, in conjunction with budesonide, planned duration until disease progression or unacceptable drug toxicity. Capecitabine will not be added to the regimen due to previous patient intolerance.   Pt is doing well. Counseled patient on administration, dosing, side effects, monitoring, drug-food interactions, safe handling, storage, and disposal. Patient will take 3 tablets (120 mg total) by mouth daily for 7 days, THEN 4 tablets (160 mg total) daily for 7 days, THEN 5 tablets (200 mg total) daily for 7 days, THEN 6 tablets (240 mg total) daily for 7 days. Take with food. Increase dose as tolerated.  Side effects include but not limited to: diarrhea, N/V, abdominal pain.    Diarrhea management: -Budesonide: Patient has not picked up her budesonide from [facility] yet. She plans on picking it up today. Reviewed dosing instructions "Take 9 mg by mouth daily." She knows this will help prevent the diarrhea from the neratinib. -Loperamide: Instructed [name] to pick some up OTC so she has some on hand. She is to take this as needed. -Dose escalation: She knows to go back down on the dose if she increases her dose during dose escalation and she is unable to tolerate the dose increase. Instructed her to call the office if this happens.  Patient reported not taking the omeprazole on her medication list. She did state she uses Tums as needed for reflux. Instructed her to separate the Tums by at least 3 hours from the neratinib.  Reviewed with patient importance of  keeping a medication schedule and plan for any missed doses.  [name] voiced understanding and appreciation. All questions answered. Medication handout and dose escalation calendar provided.  Provided patient with [facility] phone number. Patient knows to call the office with questions or concerns. Oral [facility] will continue to follow.  Darl Pikes, PharmD, BCPS, BCOP, CPP Hematology/Oncology Clinical Pharmacist [facility] [phone]  03/27/2019 9:51 AM

## 2019-03-27 NOTE — Patient Instructions (Signed)

## 2019-03-27 NOTE — Patient Instructions (Signed)
Zoledronic Acid injection (Hypercalcemia, Oncology) What is this medicine? ZOLEDRONIC ACID (ZOE le dron ik AS id) lowers the amount of calcium loss from bone. It is used to treat too much calcium in your blood from cancer. It is also used to prevent complications of cancer that has spread to the bone. This medicine may be used for other purposes; ask your health care provider or pharmacist if you have questions. COMMON BRAND NAME(S): Zometa What should I tell my health care provider before I take this medicine? They need to know if you have any of these conditions:  aspirin-sensitive asthma  cancer, especially if you are receiving medicines used to treat cancer  dental disease or wear dentures  infection  kidney disease  receiving corticosteroids like dexamethasone or prednisone  an unusual or allergic reaction to zoledronic acid, other medicines, foods, dyes, or preservatives  pregnant or trying to get pregnant  breast-feeding How should I use this medicine? This medicine is for infusion into a vein. It is given by a health care professional in a hospital or clinic setting. Talk to your pediatrician regarding the use of this medicine in children. Special care may be needed. Overdosage: If you think you have taken too much of this medicine contact a poison control center or emergency room at once. NOTE: This medicine is only for you. Do not share this medicine with others. What if I miss a dose? It is important not to miss your dose. Call your doctor or health care professional if you are unable to keep an appointment. What may interact with this medicine?  certain antibiotics given by injection  NSAIDs, medicines for pain and inflammation, like ibuprofen or naproxen  some diuretics like bumetanide, furosemide  teriparatide  thalidomide This list may not describe all possible interactions. Give your health care provider a list of all the medicines, herbs, non-prescription  drugs, or dietary supplements you use. Also tell them if you smoke, drink alcohol, or use illegal drugs. Some items may interact with your medicine. What should I watch for while using this medicine? Visit your doctor or health care professional for regular checkups. It may be some time before you see the benefit from this medicine. Do not stop taking your medicine unless your doctor tells you to. Your doctor may order blood tests or other tests to see how you are doing. Women should inform their doctor if they wish to become pregnant or think they might be pregnant. There is a potential for serious side effects to an unborn child. Talk to your health care professional or pharmacist for more information. You should make sure that you get enough calcium and vitamin D while you are taking this medicine. Discuss the foods you eat and the vitamins you take with your health care professional. Some people who take this medicine have severe bone, joint, and/or muscle pain. This medicine may also increase your risk for jaw problems or a broken thigh bone. Tell your doctor right away if you have severe pain in your jaw, bones, joints, or muscles. Tell your doctor if you have any pain that does not go away or that gets worse. Tell your dentist and dental surgeon that you are taking this medicine. You should not have major dental surgery while on this medicine. See your dentist to have a dental exam and fix any dental problems before starting this medicine. Take good care of your teeth while on this medicine. Make sure you see your dentist for regular follow-up   appointments. What side effects may I notice from receiving this medicine? Side effects that you should report to your doctor or health care professional as soon as possible:  allergic reactions like skin rash, itching or hives, swelling of the face, lips, or tongue  anxiety, confusion, or depression  breathing problems  changes in vision  eye  pain  feeling faint or lightheaded, falls  jaw pain, especially after dental work  mouth sores  muscle cramps, stiffness, or weakness  redness, blistering, peeling or loosening of the skin, including inside the mouth  trouble passing urine or change in the amount of urine Side effects that usually do not require medical attention (report to your doctor or health care professional if they continue or are bothersome):  bone, joint, or muscle pain  constipation  diarrhea  fever  hair loss  irritation at site where injected  loss of appetite  nausea, vomiting  stomach upset  trouble sleeping  trouble swallowing  weak or tired This list may not describe all possible side effects. Call your doctor for medical advice about side effects. You may report side effects to FDA at 1-800-FDA-1088. Where should I keep my medicine? This drug is given in a hospital or clinic and will not be stored at home. NOTE: This sheet is a summary. It may not cover all possible information. If you have questions about this medicine, talk to your doctor, pharmacist, or health care provider.  2020 [employer]vier/Gold Standard (2013-05-27 14:19:39)  

## 2019-03-30 ENCOUNTER — Other Ambulatory Visit: Payer: Self-pay | Admitting: Hematology

## 2019-03-30 MED ORDER — COLESTIPOL HCL 1 G PO TABS: 2.0000 g | ORAL_TABLET | Freq: Two times a day (BID) | ORAL | 1 refills | Status: DC

## 2019-03-30 NOTE — Progress Notes (Unsigned)
   Colestipol

## 2019-03-31 NOTE — Progress Notes (Signed)
[name]   Telephone:([phone]) [phone] Fax:(336) [phone]   Clinic Follow up Note   Patient Care Team: [name], [name], [name] as PCP - General (Physician Assistant) [name], MD as Consulting Physician (Orthopedic Surgery)  I connected with [name] on 04/07/2019 at  8:40 AM EDT by telephone visit and verified that I am speaking with the correct person using two identifiers.  I discussed the limitations, risks, security and privacy concerns of performing an evaluation and management service by telephone and the availability of in person appointments. I also discussed with the patient that there may be a patient responsible charge related to this service. The patient expressed understanding and agreed to proceed.   Other persons participating in the visit and their role in the encounter:  Her ASL Interpretor  Patient's location:  Her home  Provider's location:  My Office   CHIEF COMPLAINT: F/u ofmetastatic breast cancer and review PET scan   SUMMARY OF ONCOLOGIC HISTORY: Oncology History Overview Note   Cancer Staging No matching staging information was found for the patient.     [name], right ([place])  11/1997 Cancer Diagnosis   Patient had 1.4 cm poorly differentiated right breast carcinoma diagnosed in Nov 1999 at age 50, 1/7 axillary nodes involved, ER/PR and HER 2 positive. She had mastectomy with the 7 axillary node evaluation, 4 cycles of adriamycin/ cytoxan followed by taxotere, then five years of tamoxifen thru June 2005 (no herceptin in 1999). She had bilateral oophorectomy in May 2005. She was briefly on aromatase inhibitor after tamoxifen, but discontinued this herself due to poor tolerance.   04/04/2015 Genetic Testing   Negative genetic testing on the breast/ovarian cancer pnael.  The Breast/Ovarian gene panel offered by [facility] includes sequencing and rearrangement analysis for the following 20 genes:  ATM,  BARD1, BRCA1, BRCA2, BRIP1, CDH1, CHEK2, EPCAM, FANCC, MLH1, MSH2, MSH6, NBN, PALB2, PMS2, PTEN, RAD51C, RAD51D, TP53, and XRCC2.   06/26/2015 Pathology Results   Initial Path Report Bone, curettage, Left femur met, intramedullary subtroch tissue - METASTATIC ADENOCARCINOMA.  Estrogen Receptor: 95%, POSITIVE, STRONG STAINING INTENSITY Progesterone Receptor: 2%, POSITIVE, STRONG  HER2 (+), IHC 3+   06/26/2015 Surgery   Biopsy of metastatic tissue and stabilization with Affixus trochanteric nail, proximal and distal interlock of left femur by Dr. [name]    06/28/2015 Imaging   CT Chest w/ Contrast 1. Extensive right internal mammary chain lymphadenopathy consistent with metastatic breast cancer. 2. Lytic metastases involving the posterior elements at T1 with probable nondisplaced pathologic fracture of the spinous process. 3. No other evidence of thoracic metastatic disease. 4. Trace bilateral pleural effusions with associated bibasilar atelectasis. 5. Indeterminate right thyroid nodule, not previously imaged. This could be evaluated with thyroid ultrasound as clinically warranted.   06/29/2015 -  Chemotherapy   Zometa started monthly on 06/29/15 and switched to q66month from 01/08/17   07/01/2015 Imaging   Bone Scan 1. Increased activity noted throughout the left femur. Although metastatic disease cannot be excluded, these changes are most likely secondary to prior surgery. 2. No other focal abnormalities identified to suggest metastatic disease.   07/12/2015 - 07/26/2015 Radiation Therapy   Left femur, 30 Gy in 10 fractions by Dr. [name]   07/14/2015 Initial Diagnosis   [name], right [place]   07/19/2015 Imaging   Initial PET Scan 1. Severe multifocal osseous metastatic disease, much greater than anticipated based on prior imaging. 2. Multifocal nodal  metastases involving the right internal mammary, prevascular and subpectoral lymph nodes. No axillary  pulmonary involvement identified. 3. No distant extra osseous metastases.   07/30/2015 - 11/29/2015 Chemotherapy   Docetaxel, Herceptin and Perjeta every 3 weeks X6 cycles, pt tolerated moderately well, restaging scan showed excellent response    09/18/2015 Imaging   CT Head w/wo Contrast 1. No acute intracranial abnormality or significant interval change. 2. Stable minimal periventricular white matter hypoattenuation on the right. This may reflect a remote ischemic injury. 3. No evidence for metastatic disease to the brain. 4. No focal soft tissue lesion to explain the patient's tenderness.   10/30/2015 Imaging   CT Abdomen Pelvis w/ Contrast 1. Few mildly prominent fluid-filled loops of small bowel scattered throughout the abdomen, with intraluminal fluid density within the distal colon. Given the provided history, findings are suggestive of acute enteritis/diarrheal illness. 2. No other acute intra-abdominal or pelvic process identified. 3. Widespread osseous metastatic disease, better evaluated on most recent PET-CT from 07/19/2015. No pathologic fracture or other complication. 4. 11 mm hypodensity within the left kidney. This lesion measures intermediate density, and is indeterminate. While this lesion is similar in size relative to recent studies, this is increased in size relative to prior study from 2012. Further evaluation with dedicated renal mass protocol CT and/or MRI is recommended for complete characterization.   12/2015 - 05/14/2016 Chemotherapy   Maintenance Herceptin and pejeta every 3 weeks stopped on 05/14/16 due to disease progression   12/25/2015 Imaging   Restaging PET Scan 1. Markedly improved skeletal activity, with only several faint foci of residual accentuated metabolic activity, but resolution of the vast majority of the previously extensive osseous metastatic disease. 2. Resolution of the prior right internal mammary and right prevascular lymph nodes. 3. Residual  subcutaneous edema and edema along fascia planes in the left upper thigh. This remains somewhat more than I would expect for placement of an IM nail 6 weeks ago. If there is leg swelling further down, consider Doppler venous ultrasound to rule out left lower extremity DVT. I do not perceive an obvious difference in density between the pelvic and common femoral veins on the noncontrast CT data. 4. Chronic right maxillary and right sphenoid sinusitis.   01/16/2016 - 10/20/2017 Anti-estrogen oral therapy   Letrozole 2.5 mg daily. She was switched to exemestane due to disease progression on 02/08/17. Stopped on 10/20/2017 when treatment changed to chemo    03/01/2016 Miscellaneous   Patient presented to ED following a fall; she reports she landed on her back and is now experiencing back pain. The patient was evaluated and discharge home same day with pain control.   05/01/2016 Imaging   CT CAP IMPRESSION: 1. Stable exam.  No new or progressive disease identified. 2. No evidence for residual or recurrent adenopathy within the chest. 3. Stable bone metastasis.   05/01/2016 Imaging   BONE SCAN IMPRESSION: 1. Small focal uptake involving the anterior rib ends of the left second and right fourth ribs, new since the prior bone scan. The location of this uptake is more suggestive of a traumatic or inflammatory etiology as opposed to metastatic disease. No other evidence of metastatic disease. 2. There are other areas of stable uptake which are most likely degenerative/reactive, including right shoulder and cervical spine uptake and uptake along the right femur adjacent to the ORIF hardware.   05/20/2016 Imaging   NM PET Skull to Thigh IMPRESSION: 1. There multiple metastatic foci in the bony pelvis, including some new abnormal  foci compare to the prior PET-CT, compatible with mildly progressive bony metastatic disease. Also, a left T11 vertebral hypermetabolic metastatic lesion is observed, new  compared to the prior exam. 2. No active extraosseous malignancy is currently identified. 3. Right anterior fourth rib healing fracture, appears be   06/04/2016 - 01/08/2017 Chemotherapy   Second line chemotherapy Kadcyla every 3 weeks started on 06/04/16 and stopped on 01/08/17 due to mixed response.     10/05/2016 Imaging   CT CAP and Bone Scan:  Bones: left intramedullary rod and left femoral head neck screw in place. In the proximal diaphysis of the left femur there is cortical thickening and irregularity. No lytic or blastic bone lesions. No fracture or vertebral endplate destruction.   No definite evidence of recurrent disease in the chest, abdomen, or pelvis.   10/28/2016 Imaging   CT A/P IMPRESSION: No acute process demonstrated in the abdomen or pelvis. No evidence of bowel obstruction or inflammation.   11/04/2016 Imaging   DG foot complete left: IMPRESSION: No fracture or dislocation.  No soft tissue abnormality   11/23/2016 Imaging   CT RIGHT FEMUR IMPRESSION: Negative CT scan of the right thigh.  No visible metastatic disease.  CT LEFT FEMUR IMPRESSION: 1. Postsurgical changes related to prior cephalomedullary rod fixation of the left femur with linear lucency along the anterolateral proximal femoral cortex at level of the lesser trochanter, suspicious for nondisplaced fracture. 2. Slightly permeative appearance of the proximal left femoral cortex at the site of known prior osseous metastases is largely unchanged. 3. Unchanged patchy lucency and sclerosis along the anterior acetabulum, which appears to correspond with an area of faint uptake on the prior PET-CT, suspicious for osseous metastasis. These results will be called to the ordering clinician or representative by the Radiologist Assistant, and communication documented in the PACS or zVision Dashboard.     01/27/2017 Imaging   IMPRESSION: 1. Mixed response to osseous metastasis. Some hypermetabolic  lesions are new and increasingly hypermetabolic, while another index lesion is decreasingly hypermetabolic. 2. No evidence of hypermetabolic soft tissue metastasis.    02/08/2017 - 10/19/2017 Antibody Plan   -Herceptin every 3 week since 02/08/17 -Oral Lanpantinib 1518m and decreased to 5057mdue to diarrhea and depression. Due to disease progression she swithced to Verzenio 15025mn 05/04/17. She did not toelrate well so she was switched to Ibrance 100m68m 05/31/17. Due to her neutropenia, Ibrance dose reduced to 75 mg daily, 3 weeks on, one-week off, stopped on 10/20/2017 due to disease progression.    04/30/2017 PET scan   IMPRESSION: 1. Primarily increase in hypermetabolism of osseous metastasis. An isolated right acetabular lesion is decreased in hypermetabolism since the prior exam. 2. No evidence of hypermetabolic soft tissue metastasis. 3. Similar non FDG avid right-sided thyroid nodule, favoring a benign etiology. Recommend attention on follow-up.   08/30/2017 PET scan   08/30/2017 PET Scan  IMPRESSION: 1. Worsening osseous metastatic disease. 2. Focal hypermetabolism in the central spinal canal the L1 level, similar to the prior exam. Metastatic implant cannot be excluded. 3. Slight enlargement of a right thyroid nodule which now appears more cystic in character. Consider further evaluation with thyroid ultrasound. If patient is clinically hyperthyroid, consider nuclear medicine thyroid uptake and scan   08/30/2017 Progression   08/30/2017 PET Scan  IMPRESSION: 1. Worsening osseous metastatic disease. 2. Focal hypermetabolism in the central spinal canal the L1 level, similar to the prior exam. Metastatic implant cannot be excluded. 3. Slight enlargement of a right thyroid  nodule which now appears more cystic in character. Consider further evaluation with thyroid ultrasound. If patient is clinically hyperthyroid, consider nuclear medicine thyroid uptake and scan   09/29/2017  - 10/11/2017 Radiation Therapy   Pt received 27 Gy in 9 fractions directed at the areas of significant uptake noted on recent bone scan the right pelvis and right proximal femur, with Dr, Sondra Come    10/20/2017 - 04/07/2018 Chemotherapy   -Herceptin every 3 weeks starting 02/08/17, perjeta added on 10/20/2017  -weekly Taxol started on 10/21/2017. Will reduce Taxol to 2 weeks on, 1 week off starting on 02/04/17.  Stoped due to mild disease progression   10/27/2017 Imaging   10/27/2017 CT AP IMPRESSION: 1. No acute abnormality. No evidence of bowel obstruction or acute bowel inflammation. Normal appendix. 2. Stable patchy sclerotic osseous metastases throughout the visualized skeleton. No new or progressive metastatic disease in the abdomen or pelvis.   01/11/2018 Imaging   Whole Body Bone Scan 01/11/18  IMPRESSION: 1. Multifocal osseous metastases as described. 2. Right scapular metastasis. 3. Right superior thoracic spinous process. 4. Right sacral ala and right L5 metastases. 5. Right acetabular metastasis. 6. Right proximal femur metastasis. 7. Diffuse metastases throughout the left femur. 8. Anterior right fourth rib metastasis.   01/11/2018 Imaging   CT CAP W Contrast 01/11/18  IMPRESSION: 1. Similar-appearing osseous metastatic disease. 2. No evidence for progressed metastatic disease in the chest, abdomen or pelvis.   03/30/2018 Imaging   Bone Scan 03/30/18  IMPRESSION: Stable multifocal bony metastatic disease.   03/30/2018 Imaging    CT CAP W Contrast 03/30/18 IMPRESSION: 1. New mild contiguous wall thickening with associated mucosal hyperenhancement throughout the left colon and rectum, suggesting nonspecific infectious or inflammatory proctocolitis. Differential includes C diff colitis. 2. Stable faint patchy sclerosis in the iliac bones and lumbar vertebral bodies. No new or progressive osseous metastatic disease by CT. Please note that the osseous lesions are  relatively occult by CT and would be better evaluated by PET-CT, as clinically warranted. 3. No evidence of extra osseous metastatic disease.  These results will be called to the ordering clinician or representative by the Radiologist Assistant, and communication documented in the PACS or zVision Dashboard.   04/15/2018 - 06/16/2018 Chemotherapy   Enhertu q3weeks starting 04/15/18. Stopped Enhertu after cycle 3 on 05/27/18 due to poor toleration due to poor toleration with anorexia, weight loss, N&V, diarrhea   04/21/2018 PET scan   PET 04/21/18  IMPRESSION: 1. Overall marked interval improvement in bony hypermetabolic disease. All of the previously identified hypermetabolic osseous metastases have decreased in hypermetabolism in the interval. There is a new tiny focus of hypermetabolism in the right aspect of the L3 vertebral body that appears to correspond to a new 8 mm lucency, raising concern for new metastatic disease. Close attention on follow-up recommended. 2. Focal hypermetabolism in the central spinal canal at the level of L1 previously has resolved. 3. No hypermetabolic soft tissue disease on today's study. 4. Right thyroid nodule seen previously has decreased in the interval.    06/16/2018 Imaging   CT LUMBAR SPINE WO CONTRAST 06/16/18 IMPRESSION: 1. Transitional lumbosacral anatomy. Correlation with radiographs is recommended prior to any operative intervention. 2. Suspicion of a right L3 vertebral body metastasis on the April PET-CT is not evident by CT. Stable degenerative versus metastatic lesion at the right L5 superior endplate. Small but increased right iliac bone metastasis since March. Lumbar MRI would best characterize lumbar metastatic disease (without contrast if necessary). 3.  Suspect L2-L3 left subarticular segment disc herniation. Query left L2 radiculitis. 4. Stable appearance of rightward L4-L5 disc degeneration which could affect the right foramen and right  lateral recess.   06/17/2018 - 11/16/2018 Chemotherapy   Restart Taxol 2 weeks on/1 week off with Herceptin and Perjeta q3weeks on 06/17/18. Perjeta was stopped after C5 due to diarrhea. Stopped Taxol after 11/16/18 due to disease progression to L3.    07/06/2018 PET scan   PET  IMPRESSION: 1. Near total resolution of hypermetabolic activity associated with skeletal metastasis. Very mild activity associated with the LEFT iliac bone which is similar to background blood pool activity. 2. No new or progressive disease.   11/21/2018 PET scan   IMPRESSION: 1. New focal hypermetabolic lesion eccentric to the right in the L3 vertebral body, maximum SUV 6.1 compatible with a new focus of active malignancy. 2. Very low-grade activity similar to blood pool in previous existing mildly sclerotic metastatic lesions including the left iliac bone lesion. Some of the mildly sclerotic lesions are not hypermetabolic compatible with effectively treated bony metastatic disease. 3. No hypermetabolic soft tissue lesions are identified.     11/30/2018 -  Chemotherapy   Continue Herceptin q3weeks    12/12/2018 - 12/17/2018 Chemotherapy   Tucatinib (TUKYSA) 6 tabs BID as a next line therapy starting 12/12/18. Stopped 12/17/18 duet o N&V, poor appetite and HA    12/21/2018 - 03/17/2019 Chemotherapy   IV Eribulin on day 1, 8 every 21 days starting 12/21/18. Stopped 03/17/19 due to mixed response to treatment.    03/21/2019 PET scan   IMPRESSION: 1. Mixed response to therapy of osseous metastasis. Although a right-sided L4 lesion demonstrates decreased hypermetabolism today, new left iliac and right ischial hypermetabolic lesions are seen. 2. No evidence of hypermetabolic soft tissue metastasis.      Chemotherapy   Neratinib (Nerlynx) titrate up from 159m daily and titrate as tolerated      CURRENT THERAPY:  -Zometa q331month -Continue Herceptin q3weeks -Neratinib (Nerlynx) titrate up from 12025mo 240m41mtarting 03/27/19. She could not tolerate 160mg80m stopped on 02/05/19 due to N&V and diarrhea. Will restart at 120mg 79m/29/20.    INTERVAL HISTORY:  Debra KIEARA SCHWARKre for a follow up and review PET scan. She presents to the clinic alone. She notes she was not feeling well due to Neratinib as it makes her really ill. She took 3 days last week and 4 days this week. She has nausea and diarrhea. She does not think she is tolerating it. She had liquid diarrhea hourly. Imodium did not work. She notes she did not take Budesonide due to $1300 cost. I called in Colestid but she did not pick up. She is fine to restart at 120mg o60m29/21.  She notes she called clinic yesterday about oxycodone refill. She is taking 2 tabs daily. She notes Gabapentin is making her sick with night sweats, unable to sleep. She plans to stop it. She has not been eating due to sickness from Neratinib. She feels she is losing weight.     REVIEW OF SYSTEMS:   Constitutional: Denies fevers, chills (+) weight loss  Eyes: Denies blurriness of vision Ears, nose, mouth, throat, and face: Denies mucositis or sore throat Respiratory: Denies cough, dyspnea or wheezes Cardiovascular: Denies palpitation, chest discomfort or lower extremity swelling Gastrointestinal:  (+) N&V, diarrhea  Skin: Denies abnormal skin rashes Lymphatics: Denies new lymphadenopathy or easy bruising Neurological:Denies numbness, tingling or new weaknesses Behavioral/Psych: Mood  is stable, no new changes  All other systems were reviewed with the patient and are negative.  MEDICAL HISTORY:  Past Medical History:  Diagnosis Date  . Abnormal Pap smear   . Anxiety   . Breast cancer ([place])   . Cancer ([facility]) 1999   breast-s/p mastectomy, chemo, rad  . CIN I (cervical intraepithelial neoplasia I) 2003   by colpo  . Congenital deafness   . Deaf   . Depression   . Port-A-Cath in place 2017   for chemotherapy  . Radiation 07/12/15-07/26/15   left femur  30 Gy  . S/P bilateral oophorectomy     SURGICAL HISTORY: Past Surgical History:  Procedure Laterality Date  . ABDOMINAL HYSTERECTOMY    . INTRAMEDULLARY (IM) NAIL INTERTROCHANTERIC Left 06/26/2015   Procedure: LEFT BIOMET LONG AFFIXS NAIL;  Surgeon: [name], MD;  Location: [facility];  Service: Orthopedics;  Laterality: Left;  . IR GENERIC HISTORICAL  08/06/2015   IR US GUIDE VASC ACCESS LEFT 08/06/2015 Aletta Edouard, MD [facility]  . IR GENERIC HISTORICAL  08/06/2015   IR FLUORO GUIDE CV LINE LEFT 08/06/2015 Aletta Edouard, MD [facility]  . IR GENERIC HISTORICAL  03/05/2016   IR CV LINE INJECTION 03/05/2016 Markus Daft, MD [facility]  . LEFT HEART CATH AND CORONARY ANGIOGRAPHY N/A 12/13/2017   Procedure: LEFT HEART CATH AND CORONARY ANGIOGRAPHY;  Surgeon: Leonie Man, MD;  Location: [facility];  Service: Cardiovascular;  Laterality: N/A;  . MASTECTOMY     right breast  . MASTECTOMY    . OVARIAN CYST REMOVAL    . RADIOLOGY WITH ANESTHESIA Left 06/25/2015   Procedure: MRI OF LEFT HIP WITH OR WITHOUT CONTRAST;  Surgeon: Medication Radiologist, MD;  Location: [name];  Service: Radiology;  Laterality: Left;  DR. MCINTYRE/MRI  . TUBAL LIGATION      I have reviewed the social history and family history with the patient and they are unchanged from previous note.  ALLERGIES:  is allergic to promethazine hcl; chlorhexidine; and diphenhydramine hcl.  MEDICATIONS:  Current Outpatient Medications  Medication Sig Dispense Refill  . albuterol (PROAIR HFA) 108 (90 Base) MCG/ACT inhaler Inhale 2 puffs into the lungs every 6 (six) hours as needed for wheezing or shortness of breath.    Marland Kitchen albuterol (PROVENTIL HFA;VENTOLIN HFA) 108 (90 Base) MCG/ACT inhaler Inhale 2 puffs into the lungs every 6 (six) hours as needed for wheezing or shortness of breath. 1 Inhaler 0  . ALPRAZolam (XANAX) 0.25 MG tablet Take 1 tablet (0.25 mg total) by mouth 2 (two) times daily as needed for anxiety. 30  tablet 0  . Budesonide ER 9 MG TB24 Take 9 mg by mouth daily. 30 tablet 0  . calcium carbonate (TUMS - DOSED IN MG ELEMENTAL CALCIUM) 500 MG chewable tablet Chew 1 tablet by mouth as needed.    . colestipol (COLESTID) 1 g tablet Take 2 tablets (2 g total) by mouth 2 (two) times daily. 60 tablet 1  . diphenoxylate-atropine (LOMOTIL) 2.5-0.025 MG tablet Take 1-2 tablets by mouth 4 (four) times daily as needed for diarrhea or loose stools. 60 tablet 1  . gabapentin (NEURONTIN) 100 MG capsule Take 1 capsule (100 mg total) by mouth at bedtime. 60 capsule 0  . lidocaine-prilocaine (EMLA) cream Apply 1 application topically as needed. Apply to Porta-Cath 1-2 hours prior to access as directed. (Patient taking differently: Apply 1 application topically as needed (To Porta-Cath 1-2 hours prior to access as directed). )  90 g 2  . magnesium oxide (MAG-OX) 400 (241.3 Mg) MG tablet Take 1 tablet (400 mg total) by mouth daily. 30 tablet 3  . methocarbamol (ROBAXIN) 500 MG tablet Take 1 tablet (500 mg total) by mouth 2 (two) times daily. (Patient taking differently: Take 500 mg by mouth 2 (two) times daily as needed for muscle spasms. ) 20 tablet 0  . Neratinib Maleate (NERLYNX) 40 MG tablet Take 3 tablets (120 mg total) by mouth daily for 7 days, THEN 4 tablets (160 mg total) daily for 7 days, THEN 5 tablets (200 mg total) daily for 7 days, THEN 6 tablets (240 mg total) daily for 7 days. Take with food. Increase dose as tolerated.. 126 tablet 0  . ondansetron (ZOFRAN) 8 MG tablet Take 1 tablet (8 mg total) by mouth every 8 (eight) hours as needed for nausea or vomiting. 45 tablet 1  . oxyCODONE (ROXICODONE) 5 MG immediate release tablet Take 1 tablet (5 mg total) by mouth every 8 (eight) hours as needed for severe pain. 60 tablet 0  . potassium chloride (KLOR-CON) 10 MEQ tablet Take 3 tablets (30 mEq total) by mouth daily. 90 tablet 0  . prochlorperazine (COMPAZINE) 10 MG tablet Take 1 tablet (10 mg total) by mouth 2  (two) times daily as needed for nausea or vomiting. 10 tablet 0  . zolpidem (AMBIEN) 5 MG tablet Take 1-2 tablets (5-10 mg total) by mouth at bedtime as needed. 60 tablet 0   No current facility-administered medications for this visit.   Facility-Administered Medications Ordered in Other Visits  Medication Dose Route Frequency Provider Last Rate Last Admin  . potassium chloride SA (K-DUR) CR tablet 40 mEq  40 mEq Oral Once Truitt Merle, MD      . sodium chloride 0.9 % 1,000 mL with potassium chloride 10 mEq infusion   Intravenous Continuous Gordy Levan, MD   Stopped at 09/10/15 1653  . sodium chloride 0.9 % injection 10 mL  10 mL Intravenous PRN Livesay, Lennis P, MD        PHYSICAL EXAMINATION: ECOG PERFORMANCE STATUS: 2 - Symptomatic, <50% confined to bed   No vitals taken today, Exam not performed today   LABORATORY DATA:  I have reviewed the data as listed CBC Latest Ref Rng & Units 03/27/2019 03/15/2019 03/07/2019  WBC 4.0 - 10.5 K/uL 4.4 6.5 1.6(L)  Hemoglobin 12.0 - 15.0 g/dL 10.9(L) 11.6(L) 10.6(L)  Hematocrit 36.0 - 46.0 % 32.9(L) 34.1(L) 31.9(L)  Platelets 150 - 400 K/uL 200 212 222     CMP Latest Ref Rng & Units 03/27/2019 03/15/2019 03/07/2019  Glucose 70 - 99 mg/dL 116(H) 95 113(H)  BUN 6 - 20 mg/dL 13 14 11   Creatinine 0.44 - 1.00 mg/dL 0.87 0.84 0.80  Sodium 135 - 145 mmol/L 143 142 142  Potassium 3.5 - 5.1 mmol/L 3.3(L) 3.7 3.3(L)  Chloride 98 - 111 mmol/L 108 105 107  CO2 22 - 32 mmol/L 25 29 25   Calcium 8.9 - 10.3 mg/dL 8.7(L) 9.5 9.1  Total Protein 6.5 - 8.1 g/dL 6.2(L) 6.6 6.3(L)  Total Bilirubin 0.3 - 1.2 mg/dL 1.8(H) 1.7(H) 1.3(H)  Alkaline Phos 38 - 126 U/L 59 72 60  AST 15 - 41 U/L 21 21 19   ALT 0 - 44 U/L 32 33 38      RADIOGRAPHIC STUDIES: I have personally reviewed the radiological images as listed and agreed with the findings in the report. No results found.   ASSESSMENT & PLAN:  FREDIA CHITTENDEN is a 54 y.o. 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 She had right mastectomy, 4 cycles of AC-T and 5 years of Tamoxifen/AI.  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 She has received Target RT. She had excellent response to First-line Docetaxel/Herceptin/Perjeta and AI but progressed on maintenance therapy. She had mixed response on second line Kadcyla, progressed on third-line Ibrance/Herceptin and poorly tolerated Enhertu.  -She triedTucatinib (TUKYSA)with low doseon 12/12/18 however due to N&V, poor appetite and HA she stopped on 12/17/18 after trying twice. -I started her on next lineivchemoEribulin on day 1, 8, every 21 days on 12/21/18, along with Herceptin every 3 weeks. -Her PET images from 03/21/19 show mixed response to therapy of bone metastasis (improved L4 lesion, new pelvic bone lesions). No evidence of hypermetabolic soft tissue metastasis.  -I discussed her disease is starting to develop resistance to her chemo treatment. I switched her to next line treatment with Neratinib (Nerlynx). Will titrate up from 168m to 2419mstarting 03/27/19. Due to high copay she was not able to get Budesonide but I called in Colestid. We will continue Herceptin until the newly approved Margetuzimab becomes available in our clinic, which I recommend.  -If she is able to tolerate Neratinib, I will add on tamoxifen.  -She tolerated 12069mose Neratinib well but 160m75me had N&V and liquid diarrhea hourly. I encouraged her to restart at lower dose 120mg27m3/29/21. Try using Lomotil and I will f/u with her next week. She is agreeable.    2. N&V, Diarrhea  -previously secondary to Eribulin, but now secondary to Neratinib  -She tolerated 120mg 73m well but 160mg s15mtopped after 3 days due to N&V and liquid diarrhea hourly. -She notes Imodium did not help. She could not afford $1300 Budesonide  and did not pick up the alternate Colestid because she was apprehensive about continuing.  -I called in Lomotil today (04/07/19). She will restart back at 120mg do82mn 04/10/19.    3.Body pain, secondary to bone mets, Hypocalcemia, leg cramps  -Since her cancer diagnosis,she has had b/l hip, shoulder and chest painat different time points, likely related to her bone mets -She declined Gabapentin given risk of suicidal thoughts. -She is being treated with Zometa q3months.45monthes have hypocalcemia with leg cramps secondary to Zometa.  -She will continue oral calcium and Vit Dand Prilosec for Acid Reflux. -Current pain includes, lower back, left upper legand recently her right leg and shoulder. She has increased use to 5-10mg 1-2 4ms a day.Her PET from 03/21/19 does indicate disease progression in her b/l pelvic lesions.  -Since recent fall and soreness since she increased oxycodone 5mg to 1-298mbs TID. I refilled 04/07/19 -She is overdue for Zometa, she will proceed with injection with her next Herceptin. She denies any recent dental issues.   4.Congenital deafness  5.Mild anemia, secondary to chemotherapy and malignancy -She will start with oral iron daily for 1-2 months and then stop. She will also start daily women's vitamin. -Stable   6.Hypokalemia, Diarrhea -She developed diarrhea s/p cycle 3 Perjeta.Has been d/c -Ipreviouslystarted her onMag 400mg daily 82mOral potassium 10meq2-3 tim46m day. If she cannot take 3 tabs I encouraged her to increase potassium in diet.  7.Goal of care discussion -she understand her goal of care is palliative to prolong her life -  she is full code for now  8.Insomnia -She cannot take Benadryl due to allergy reaction. She has tried trazodone with no relief.  -She is on Xanax and Ambien 1-2 tabs nightly.I advised her to not take them at the same time.   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 She declined using ice bags  using infusion -stable. Will watch on chemo.  10. Neutropenia, secondary to chemo -Seen after C4D1 with ANC 0.4 on 03/07/19 -She has been on Onpro on day 8 which has not been enough. I recommend Granix injections the day after chemo, she is apprehensive due to needles and frequent visits.  -Due to mixed response, Eribulin was d/c.   11. Mildhyperbilirubinemia -likely second to chemotherapy, fluctuates  -Will monitor closely   PLAN: -I refilled oxycodone and called in Lomotil today up to 4 times a day as needed for diarrhea  -hold Neratinib until 04/10/19 and restart at 111m daily until next visit  -Lab, flush, f/u and Herceptin in a week    No problem-specific Assessment & Plan notes found for this encounter.   No orders of the defined types were placed in this encounter.  I discussed the assessment and treatment plan with the patient. The patient was provided an opportunity to ask questions and all were answered. The patient agreed with the plan and demonstrated an understanding of the instructions.  The patient was advised to call back or seek an in-person evaluation if the symptoms worsen or if the condition fails to improve as anticipated.  The total time spent in the appointment was 22 minutes.     YTruitt Merle MD 04/07/2019   I, AJoslyn Devon am acting as scribe for YTruitt Merle MD.   I have reviewed the above documentation for accuracy and completeness, and I agree with the above.

## 2019-04-05 ENCOUNTER — Encounter: Payer: Self-pay | Admitting: Hematology

## 2019-04-07 ENCOUNTER — Inpatient Hospital Stay: Payer: Medicaid Other

## 2019-04-07 ENCOUNTER — Telehealth: Payer: Self-pay

## 2019-04-07 ENCOUNTER — Inpatient Hospital Stay (HOSPITAL_BASED_OUTPATIENT_CLINIC_OR_DEPARTMENT_OTHER): Payer: Medicaid Other | Admitting: Hematology

## 2019-04-07 ENCOUNTER — Encounter: Payer: Self-pay | Admitting: Hematology

## 2019-04-07 DIAGNOSIS — C50911 Malignant neoplasm of unspecified site of right female breast: Secondary | ICD-10-CM | POA: Diagnosis not present

## 2019-04-07 DIAGNOSIS — C7951 Secondary malignant neoplasm of bone: Secondary | ICD-10-CM | POA: Diagnosis not present

## 2019-04-07 MED ORDER — DIPHENOXYLATE-ATROPINE 2.5-0.025 MG PO TABS: 1.0000 | ORAL_TABLET | Freq: Four times a day (QID) | ORAL | 1 refills | Status: DC | PRN

## 2019-04-07 MED ORDER — OXYCODONE HCL 5 MG PO TABS: 5.0000 mg | ORAL_TABLET | Freq: Three times a day (TID) | ORAL | 0 refills | Status: DC | PRN

## 2019-04-07 NOTE — Telephone Encounter (Signed)
Left vm for patient letting her know she missed her appts today and we will need to reschedule.  I also request to call us if she is not feeling well.

## 2019-04-07 NOTE — Telephone Encounter (Signed)
Scheduling message sent to reschedule for next week and to add port flush with lab.

## 2019-04-10 NOTE — Telephone Encounter (Signed)
error 

## 2019-04-12 NOTE — Telephone Encounter (Signed)
disregard

## 2019-04-13 NOTE — Progress Notes (Signed)
[name]   [phone]) [phone] Fax:(336) [phone]   Clinic Follow up Note   Patient Care Team: [name], [name], [name] as PCP - General (Physician Assistant) [name], MD as Consulting Physician (Orthopedic Surgery)  Date of Service:  04/14/2019  CHIEF COMPLAINT:  F/u ofmetastatic breast cancer  SUMMARY OF ONCOLOGIC HISTORY: Oncology History Overview Note   Cancer Staging No matching staging information was found for the patient.     [name], right ([name])  11/1997 Cancer Diagnosis   Patient had 1.4 cm poorly differentiated right breast carcinoma diagnosed in Nov 1999 at age 54, 1/7 axillary nodes involved, ER/PR and HER 2 positive. She had mastectomy with the 7 axillary node evaluation, 4 cycles of adriamycin/ cytoxan followed by taxotere, then five years of tamoxifen thru June 2005 (no herceptin in 1999). She had bilateral oophorectomy in May 2005. She was briefly on aromatase inhibitor after tamoxifen, but discontinued this herself due to poor tolerance.   04/04/2015 Genetic Testing   Negative genetic testing on the breast/ovarian cancer pnael.  The Breast/Ovarian gene panel offered by [facility] includes sequencing and rearrangement analysis for the following 20 genes:  ATM, BARD1, BRCA1, BRCA2, BRIP1, CDH1, CHEK2, EPCAM, FANCC, MLH1, MSH2, MSH6, NBN, PALB2, PMS2, PTEN, RAD51C, RAD51D, TP53, and XRCC2.   06/26/2015 Pathology Results   Initial Path Report Bone, curettage, Left femur met, intramedullary subtroch tissue - METASTATIC ADENOCARCINOMA.  Estrogen Receptor: 95%, POSITIVE, STRONG STAINING INTENSITY Progesterone Receptor: 2%, POSITIVE, STRONG  HER2 (+), IHC 3+   06/26/2015 Surgery   Biopsy of metastatic tissue and stabilization with Affixus trochanteric nail, proximal and distal interlock of left femur by Dr. [name]    06/28/2015 Imaging   CT Chest w/ Contrast 1. Extensive right internal mammary chain  lymphadenopathy consistent with metastatic breast cancer. 2. Lytic metastases involving the posterior elements at T1 with probable nondisplaced pathologic fracture of the spinous process. 3. No other evidence of thoracic metastatic disease. 4. Trace bilateral pleural effusions with associated bibasilar atelectasis. 5. Indeterminate right thyroid nodule, not previously imaged. This could be evaluated with thyroid ultrasound as clinically warranted.   06/29/2015 -  Chemotherapy   Zometa started monthly on 06/29/15 and switched to q34month from 01/08/17   07/01/2015 Imaging   Bone Scan 1. Increased activity noted throughout the left femur. Although metastatic disease cannot be excluded, these changes are most likely secondary to prior surgery. 2. No other focal abnormalities identified to suggest metastatic disease.   07/12/2015 - 07/26/2015 Radiation Therapy   Left femur, 30 Gy in 10 fractions by Dr. [name]   07/14/2015 Initial Diagnosis   [name], right ([place]   07/19/2015 Imaging   Initial PET Scan 1. Severe multifocal osseous metastatic disease, much greater than anticipated based on prior imaging. 2. Multifocal nodal metastases involving the right internal mammary, prevascular and subpectoral lymph nodes. No axillary pulmonary involvement identified. 3. No distant extra osseous metastases.   07/30/2015 - 11/29/2015 Chemotherapy   Docetaxel, Herceptin and Perjeta every 3 weeks X6 cycles, pt tolerated moderately well, restaging scan showed excellent response    09/18/2015 Imaging   CT Head w/wo Contrast 1. No acute intracranial abnormality or significant interval change. 2. Stable minimal periventricular white matter hypoattenuation on the right. This may reflect a remote ischemic injury. 3. No evidence for metastatic disease to the brain. 4. No focal soft tissue lesion to explain the patient's tenderness.   10/30/2015 Imaging  CT Abdomen Pelvis w/  Contrast 1. Few mildly prominent fluid-filled loops of small bowel scattered throughout the abdomen, with intraluminal fluid density within the distal colon. Given the provided history, findings are suggestive of acute enteritis/diarrheal illness. 2. No other acute intra-abdominal or pelvic process identified. 3. Widespread osseous metastatic disease, better evaluated on most recent PET-CT from 07/19/2015. No pathologic fracture or other complication. 4. 11 mm hypodensity within the left kidney. This lesion measures intermediate density, and is indeterminate. While this lesion is similar in size relative to recent studies, this is increased in size relative to prior study from 2012. Further evaluation with dedicated renal mass protocol CT and/or MRI is recommended for complete characterization.   12/2015 - 05/14/2016 Chemotherapy   Maintenance Herceptin and pejeta every 3 weeks stopped on 05/14/16 due to disease progression   12/25/2015 Imaging   Restaging PET Scan 1. Markedly improved skeletal activity, with only several faint foci of residual accentuated metabolic activity, but resolution of the vast majority of the previously extensive osseous metastatic disease. 2. Resolution of the prior right internal mammary and right prevascular lymph nodes. 3. Residual subcutaneous edema and edema along fascia planes in the left upper thigh. This remains somewhat more than I would expect for placement of an IM nail 6 weeks ago. If there is leg swelling further down, consider Doppler venous ultrasound to rule out left lower extremity DVT. I do not perceive an obvious difference in density between the pelvic and common femoral veins on the noncontrast CT data. 4. Chronic right maxillary and right sphenoid sinusitis.   01/16/2016 - 10/20/2017 Anti-estrogen oral therapy   Letrozole 2.5 mg daily. She was switched to exemestane due to disease progression on 02/08/17. Stopped on 10/20/2017 when treatment changed to chemo     03/01/2016 Miscellaneous   Patient presented to ED following a fall; she reports she landed on her back and is now experiencing back pain. The patient was evaluated and discharge home same day with pain control.   05/01/2016 Imaging   CT CAP IMPRESSION: 1. Stable exam.  No new or progressive disease identified. 2. No evidence for residual or recurrent adenopathy within the chest. 3. Stable bone metastasis.   05/01/2016 Imaging   BONE SCAN IMPRESSION: 1. Small focal uptake involving the anterior rib ends of the left second and right fourth ribs, new since the prior bone scan. The location of this uptake is more suggestive of a traumatic or inflammatory etiology as opposed to metastatic disease. No other evidence of metastatic disease. 2. There are other areas of stable uptake which are most likely degenerative/reactive, including right shoulder and cervical spine uptake and uptake along the right femur adjacent to the ORIF hardware.   05/20/2016 Imaging   NM PET Skull to Thigh IMPRESSION: 1. There multiple metastatic foci in the bony pelvis, including some new abnormal foci compare to the prior PET-CT, compatible with mildly progressive bony metastatic disease. Also, a left T11 vertebral hypermetabolic metastatic lesion is observed, new compared to the prior exam. 2. No active extraosseous malignancy is currently identified. 3. Right anterior fourth rib healing fracture, appears be   06/04/2016 - 01/08/2017 Chemotherapy   Second line chemotherapy Kadcyla every 3 weeks started on 06/04/16 and stopped on 01/08/17 due to mixed response.     10/05/2016 Imaging   CT CAP and Bone Scan:  Bones: left intramedullary rod and left femoral head neck screw in place. In the proximal diaphysis of the left femur there is cortical thickening and   irregularity. No lytic or blastic bone lesions. No fracture or vertebral endplate destruction.   No definite evidence of recurrent disease in the  chest, abdomen, or pelvis.   10/28/2016 Imaging   CT A/P IMPRESSION: No acute process demonstrated in the abdomen or pelvis. No evidence of bowel obstruction or inflammation.   11/04/2016 Imaging   DG foot complete left: IMPRESSION: No fracture or dislocation.  No soft tissue abnormality   11/23/2016 Imaging   CT RIGHT FEMUR IMPRESSION: Negative CT scan of the right thigh.  No visible metastatic disease.  CT LEFT FEMUR IMPRESSION: 1. Postsurgical changes related to prior cephalomedullary rod fixation of the left femur with linear lucency along the anterolateral proximal femoral cortex at level of the lesser trochanter, suspicious for nondisplaced fracture. 2. Slightly permeative appearance of the proximal left femoral cortex at the site of known prior osseous metastases is largely unchanged. 3. Unchanged patchy lucency and sclerosis along the anterior acetabulum, which appears to correspond with an area of faint uptake on the prior PET-CT, suspicious for osseous metastasis. These results will be called to the ordering clinician or representative by the Radiologist Assistant, and communication documented in the PACS or zVision Dashboard.     01/27/2017 Imaging   IMPRESSION: 1. Mixed response to osseous metastasis. Some hypermetabolic lesions are new and increasingly hypermetabolic, while another index lesion is decreasingly hypermetabolic. 2. No evidence of hypermetabolic soft tissue metastasis.    02/08/2017 - 10/19/2017 Antibody Plan   -Herceptin every 3 week since 02/08/17 -Oral Lanpantinib 1562m and decreased to 5045mdue to diarrhea and depression. Due to disease progression she swithced to Verzenio 15035mn 05/04/17. She did not toelrate well so she was switched to Ibrance 100m50m 05/31/17. Due to her neutropenia, Ibrance dose reduced to 75 mg daily, 3 weeks on, one-week off, stopped on 10/20/2017 due to disease progression.    04/30/2017 PET scan   IMPRESSION: 1.  Primarily increase in hypermetabolism of osseous metastasis. An isolated right acetabular lesion is decreased in hypermetabolism since the prior exam. 2. No evidence of hypermetabolic soft tissue metastasis. 3. Similar non FDG avid right-sided thyroid nodule, favoring a benign etiology. Recommend attention on follow-up.   08/30/2017 PET scan   08/30/2017 PET Scan  IMPRESSION: 1. Worsening osseous metastatic disease. 2. Focal hypermetabolism in the central spinal canal the L1 level, similar to the prior exam. Metastatic implant cannot be excluded. 3. Slight enlargement of a right thyroid nodule which now appears more cystic in character. Consider further evaluation with thyroid ultrasound. If patient is clinically hyperthyroid, consider nuclear medicine thyroid uptake and scan   08/30/2017 Progression   08/30/2017 PET Scan  IMPRESSION: 1. Worsening osseous metastatic disease. 2. Focal hypermetabolism in the central spinal canal the L1 level, similar to the prior exam. Metastatic implant cannot be excluded. 3. Slight enlargement of a right thyroid nodule which now appears more cystic in character. Consider further evaluation with thyroid ultrasound. If patient is clinically hyperthyroid, consider nuclear medicine thyroid uptake and scan   09/29/2017 - 10/11/2017 Radiation Therapy   Pt received 27 Gy in 9 fractions directed at the areas of significant uptake noted on recent bone scan the right pelvis and right proximal femur, with Dr, KinaSondra Come10/09/2017 - 04/07/2018 Chemotherapy   -Herceptin every 3 weeks starting 02/08/17, perjeta added on 10/20/2017  -weekly Taxol started on 10/21/2017. Will reduce Taxol to 2 weeks on, 1 week off starting on 02/04/17.  Stoped due to mild disease progression  10/27/2017 Imaging   10/27/2017 CT AP IMPRESSION: 1. No acute abnormality. No evidence of bowel obstruction or acute bowel inflammation. Normal appendix. 2. Stable patchy sclerotic osseous  metastases throughout the visualized skeleton. No new or progressive metastatic disease in the abdomen or pelvis.   01/11/2018 Imaging   Whole Body Bone Scan 01/11/18  IMPRESSION: 1. Multifocal osseous metastases as described. 2. Right scapular metastasis. 3. Right superior thoracic spinous process. 4. Right sacral ala and right L5 metastases. 5. Right acetabular metastasis. 6. Right proximal femur metastasis. 7. Diffuse metastases throughout the left femur. 8. Anterior right fourth rib metastasis.   01/11/2018 Imaging   CT CAP W Contrast 01/11/18  IMPRESSION: 1. Similar-appearing osseous metastatic disease. 2. No evidence for progressed metastatic disease in the chest, abdomen or pelvis.   03/30/2018 Imaging   Bone Scan 03/30/18  IMPRESSION: Stable multifocal bony metastatic disease.   03/30/2018 Imaging    CT CAP W Contrast 03/30/18 IMPRESSION: 1. New mild contiguous wall thickening with associated mucosal hyperenhancement throughout the left colon and rectum, suggesting nonspecific infectious or inflammatory proctocolitis. Differential includes C diff colitis. 2. Stable faint patchy sclerosis in the iliac bones and lumbar vertebral bodies. No new or progressive osseous metastatic disease by CT. Please note that the osseous lesions are relatively occult by CT and would be better evaluated by PET-CT, as clinically warranted. 3. No evidence of extra osseous metastatic disease.  These results will be called to the ordering clinician or representative by the Radiologist Assistant, and communication documented in the PACS or zVision Dashboard.   04/15/2018 - 06/16/2018 Chemotherapy   Enhertu q3weeks starting 04/15/18. Stopped Enhertu after cycle 3 on 05/27/18 due to poor toleration due to poor toleration with anorexia, weight loss, N&V, diarrhea   04/21/2018 PET scan   PET 04/21/18  IMPRESSION: 1. Overall marked interval improvement in bony hypermetabolic disease. All of the  previously identified hypermetabolic osseous metastases have decreased in hypermetabolism in the interval. There is a new tiny focus of hypermetabolism in the right aspect of the L3 vertebral body that appears to correspond to a new 8 mm lucency, raising concern for new metastatic disease. Close attention on follow-up recommended. 2. Focal hypermetabolism in the central spinal canal at the level of L1 previously has resolved. 3. No hypermetabolic soft tissue disease on today's study. 4. Right thyroid nodule seen previously has decreased in the interval.    06/16/2018 Imaging   CT LUMBAR SPINE WO CONTRAST 06/16/18 IMPRESSION: 1. Transitional lumbosacral anatomy. Correlation with radiographs is recommended prior to any operative intervention. 2. Suspicion of a right L3 vertebral body metastasis on the April PET-CT is not evident by CT. Stable degenerative versus metastatic lesion at the right L5 superior endplate. Small but increased right iliac bone metastasis since March. Lumbar MRI would best characterize lumbar metastatic disease (without contrast if necessary). 3. Suspect L2-L3 left subarticular segment disc herniation. Query left L2 radiculitis. 4. Stable appearance of rightward L4-L5 disc degeneration which could affect the right foramen and right lateral recess.   06/17/2018 - 11/16/2018 Chemotherapy   Restart Taxol 2 weeks on/1 week off with Herceptin and Perjeta q3weeks on 06/17/18. Perjeta was stopped after C5 due to diarrhea. Stopped Taxol after 11/16/18 due to disease progression to L3.    07/06/2018 PET scan   PET  IMPRESSION: 1. Near total resolution of hypermetabolic activity associated with skeletal metastasis. Very mild activity associated with the LEFT iliac bone which is similar to background blood pool activity. 2. No new   or progressive disease.   11/21/2018 PET scan   IMPRESSION: 1. New focal hypermetabolic lesion eccentric to the right in the L3 vertebral body,  maximum SUV 6.1 compatible with a new focus of active malignancy. 2. Very low-grade activity similar to blood pool in previous existing mildly sclerotic metastatic lesions including the left iliac bone lesion. Some of the mildly sclerotic lesions are not hypermetabolic compatible with effectively treated bony metastatic disease. 3. No hypermetabolic soft tissue lesions are identified.     11/30/2018 -  Chemotherapy   Continue Herceptin q3weeks    12/12/2018 - 12/17/2018 Chemotherapy   Tucatinib (TUKYSA) 6 tabs BID as a next line therapy starting 12/12/18. Stopped 12/17/18 duet o N&V, poor appetite and HA    12/21/2018 - 03/17/2019 Chemotherapy   IV Eribulin on day 1, 8 every 21 days starting 12/21/18. Stopped 03/17/19 due to mixed response to treatment.    03/21/2019 PET scan   IMPRESSION: 1. Mixed response to therapy of osseous metastasis. Although a right-sided L4 lesion demonstrates decreased hypermetabolism today, new left iliac and right ischial hypermetabolic lesions are seen. 2. No evidence of hypermetabolic soft tissue metastasis.     03/27/2019 -  Chemotherapy   Neratinib (Nerlynx) titrate up from '120mg'$  to '240mg'$  starting 03/27/19. She could not tolerate '160mg'$  (after taking for 2 days) and stopped on 02/05/19 due to N/V/D. Restarted at '120mg'$  on 04/10/18.       CURRENT THERAPY:  -Zometa q43month -Continue Herceptin q3weeks -Neratinib (Nerlynx) titrate up from '120mg'$  to '240mg'$  starting 03/27/19. She could not tolerate '160mg'$  and stopped on 02/05/19 due to N&V and diarrhea. Will restart at '120mg'$  on 04/10/18.   INTERVAL HISTORY:  Debra RESCHis here for a follow up. She presents to the clinic with her Interpretor. She notes she has diffuse skin rash which she is 99% sure this is related to CBD oil which she used 3 drops at 7-8pm last night for the first time. She started itching a few hours later and rash appeared. She did not take any allergy medication for it. She did use cortisone  cream. She picked up her Colestid which was only $3 copay but the size is too large for her to swallow. She notes she has been taking Neratinib at '120mg'$  daily. She notes her pain of hip is manageable now.    REVIEW OF SYSTEMS:   Constitutional: Denies fevers, chills or abnormal weight loss Eyes: Denies blurriness of vision Ears, nose, mouth, throat, and face: Denies mucositis or sore throat Respiratory: Denies cough, dyspnea or wheezes Cardiovascular: Denies palpitation, chest discomfort or lower extremity swelling Gastrointestinal:  Denies nausea, heartburn or change in bowel habits Skin: (+) Diffuse skin rash across body, itching.  Lymphatics: Denies new lymphadenopathy or easy bruising Neurological:Denies numbness, tingling or new weaknesses Behavioral/Psych: Mood is stable, no new changes  All other systems were reviewed with the patient and are negative.  MEDICAL HISTORY:  Past Medical History:  Diagnosis Date  . Abnormal Pap smear   . Anxiety   . [place] cancer ([place]   . [place]   breast-s/p mastectomy, chemo, rad  . CIN I (cervical intraepithelial neoplasia I) 2003   by colpo  . Congenital deafness   . Deaf   . Depression   . Port-A-Cath in place 2017   for chemotherapy  . Radiation 07/12/15-07/26/15   left femur 30 Gy  . S/P bilateral oophorectomy     SURGICAL HISTORY: Past Surgical History:  Procedure Laterality Date  .  ABDOMINAL HYSTERECTOMY    . INTRAMEDULLARY (IM) NAIL INTERTROCHANTERIC Left 06/26/2015   Procedure: LEFT BIOMET LONG AFFIXS NAIL;  Surgeon: [name], MD;  Location: [facility];  Service: Orthopedics;  Laterality: Left;  . IR GENERIC HISTORICAL  08/06/2015   IR US GUIDE VASC ACCESS LEFT 08/06/2015 Aletta Edouard, MD [facility]  . IR GENERIC HISTORICAL  08/06/2015   IR FLUORO GUIDE CV LINE LEFT 08/06/2015 Aletta Edouard, MD [facility]  . IR GENERIC HISTORICAL  03/05/2016   IR CV LINE INJECTION 03/05/2016 Markus Daft, MD [facility]  . LEFT  HEART CATH AND CORONARY ANGIOGRAPHY N/A 12/13/2017   Procedure: LEFT HEART CATH AND CORONARY ANGIOGRAPHY;  Surgeon: Leonie Man, MD;  Location: [facility];  Service: Cardiovascular;  Laterality: N/A;  . MASTECTOMY     right breast  . MASTECTOMY    . OVARIAN CYST REMOVAL    . RADIOLOGY WITH ANESTHESIA Left 06/25/2015   Procedure: MRI OF LEFT HIP WITH OR WITHOUT CONTRAST;  Surgeon: Medication Radiologist, MD;  Location: [facility];  Service: Radiology;  Laterality: Left;  DR. MCINTYRE/MRI  . TUBAL LIGATION      I have reviewed the social history and family history with the patient and they are unchanged from previous note.  ALLERGIES:  is allergic to promethazine hcl; chlorhexidine; and diphenhydramine hcl.  MEDICATIONS:  Current Outpatient Medications  Medication Sig Dispense Refill  . albuterol (PROAIR HFA) 108 (90 Base) MCG/ACT inhaler Inhale 2 puffs into the lungs every 6 (six) hours as needed for wheezing or shortness of breath.    Marland Kitchen albuterol (PROVENTIL HFA;VENTOLIN HFA) 108 (90 Base) MCG/ACT inhaler Inhale 2 puffs into the lungs every 6 (six) hours as needed for wheezing or shortness of breath. 1 Inhaler 0  . ALPRAZolam (XANAX) 0.25 MG tablet Take 1 tablet (0.25 mg total) by mouth 2 (two) times daily as needed for anxiety. 30 tablet 0  . calcium carbonate (TUMS - DOSED IN MG ELEMENTAL CALCIUM) 500 MG chewable tablet Chew 1 tablet by mouth as needed.    . colestipol (COLESTID) 1 g tablet Take 2 tablets (2 g total) by mouth 2 (two) times daily. 60 tablet 1  . diphenoxylate-atropine (LOMOTIL) 2.5-0.025 MG tablet Take 1-2 tablets by mouth 4 (four) times daily as needed for diarrhea or loose stools. 60 tablet 1  . gabapentin (NEURONTIN) 100 MG capsule Take 1 capsule (100 mg total) by mouth at bedtime. 60 capsule 0  . lidocaine-prilocaine (EMLA) cream Apply 1 application topically as needed. Apply to Porta-Cath 1-2 hours prior to access as directed. 90 g 2  . loratadine (CLARITIN) 10  MG tablet Take 1 tablet (10 mg total) by mouth daily. 30 tablet 0  . magnesium oxide (MAG-OX) 400 (241.3 Mg) MG tablet Take 1 tablet (400 mg total) by mouth daily. 30 tablet 3  . methocarbamol (ROBAXIN) 500 MG tablet Take 1 tablet (500 mg total) by mouth 2 (two) times daily. (Patient taking differently: Take 500 mg by mouth 2 (two) times daily as needed for muscle spasms. ) 20 tablet 0  . Neratinib Maleate (NERLYNX) 40 MG tablet Take 3 tablets (120 mg total) by mouth daily for 7 days, THEN 4 tablets (160 mg total) daily for 7 days, THEN 5 tablets (200 mg total) daily for 7 days, THEN 6 tablets (240 mg total) daily for 7 days. Take with food. Increase dose as tolerated.. 126 tablet 0  . ondansetron (ZOFRAN) 8 MG tablet Take 1 tablet (8  mg total) by mouth every 8 (eight) hours as needed for nausea or vomiting. 45 tablet 1  . oxyCODONE (ROXICODONE) 5 MG immediate release tablet Take 1 tablet (5 mg total) by mouth every 8 (eight) hours as needed for severe pain. 60 tablet 0  . potassium chloride (KLOR-CON) 10 MEQ tablet Take 3 tablets (30 mEq total) by mouth daily. 90 tablet 0  . predniSONE (DELTASONE) 10 MG tablet Take 1 tablet (10 mg total) by mouth daily with breakfast. Take 4 tab for first day, then decrease 1 tab daily until she finishes 10 tablet 0  . prochlorperazine (COMPAZINE) 10 MG tablet Take 1 tablet (10 mg total) by mouth 2 (two) times daily as needed for nausea or vomiting. 10 tablet 0  . zolpidem (AMBIEN) 5 MG tablet Take 1-2 tablets (5-10 mg total) by mouth at bedtime as needed. 60 tablet 0   No current facility-administered medications for this visit.   Facility-Administered Medications Ordered in Other Visits  Medication Dose Route Frequency Provider Last Rate Last Admin  . potassium chloride SA (K-DUR) CR tablet 40 mEq  40 mEq Oral Once Truitt Merle, MD      . sodium chloride 0.9 % 1,000 mL with potassium chloride 10 mEq infusion   Intravenous Continuous Gordy Levan, MD   Stopped at  09/10/15 1653  . sodium chloride 0.9 % injection 10 mL  10 mL Intravenous PRN Livesay, Lennis P, MD        PHYSICAL EXAMINATION: ECOG PERFORMANCE STATUS: 1 - Symptomatic but completely ambulatory  Vitals:   04/14/19 1306  BP: 115/76  Pulse: 94  Resp: 20  Temp: 98.5 F (36.9 C)  SpO2: 100%   Filed Weights   04/14/19 1306  Weight: 132 lb (59.9 kg)    Due to COVID19 we will limit examination to appearance.   GENERAL:alert, no distress and comfortable SKIN: (+) Diffuse skin rash with skin erythema across body (truck and all extremities) EYES: normal, Conjunctiva are pink and non-injected, sclera clear  NEURO: alert & oriented x 3 with fluent speech   LABORATORY DATA:  I have reviewed the data as listed CBC Latest Ref Rng & Units 04/14/2019 03/27/2019 03/15/2019  WBC 4.0 - 10.5 K/uL 6.4 4.4 6.5  Hemoglobin 12.0 - 15.0 g/dL 12.2 10.9(L) 11.6(L)  Hematocrit 36.0 - 46.0 % 36.3 32.9(L) 34.1(L)  Platelets 150 - 400 K/uL 203 200 212     CMP Latest Ref Rng & Units 04/14/2019 03/27/2019 03/15/2019  Glucose 70 - 99 mg/dL 86 116(H) 95  BUN 6 - 20 mg/dL '13 13 14  '$ Creatinine 0.44 - 1.00 mg/dL 0.95 0.87 0.84  Sodium 135 - 145 mmol/L 142 143 142  Potassium 3.5 - 5.1 mmol/L 3.1(L) 3.3(L) 3.7  Chloride 98 - 111 mmol/L 109 108 105  CO2 22 - 32 mmol/L '22 25 29  '$ Calcium 8.9 - 10.3 mg/dL 9.3 8.7(L) 9.5  Total Protein 6.5 - 8.1 g/dL 6.6 6.2(L) 6.6  Total Bilirubin 0.3 - 1.2 mg/dL 1.7(H) 1.8(H) 1.7(H)  Alkaline Phos 38 - 126 U/L 71 59 72  AST 15 - 41 U/L '19 21 21  '$ ALT 0 - 44 U/L 16 32 33      RADIOGRAPHIC STUDIES: I have personally reviewed the radiological images as listed and agreed with the findings in the report. No results found.   ASSESSMENT & PLAN:  WISDOM SEYBOLD is a 54 y.o. 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 She had right mastectomy, 4 cycles of AC-T and 5  years of Tamoxifen/AI.  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 She has received Target RT. She had excellent response to First-line Docetaxel/Herceptin/Perjeta and AI but progressed on maintenance therapy. She had mixed response on second line Kadcyla, progressed on third-line Ibrance/Herceptin and poorly tolerated Enhertu.  -She triedTucatinib (TUKYSA)with low doseon 12/12/18 however due to N&V, poor appetite and HA she stopped on 12/17/18 after trying twice. -I started her on next lineivchemoEribulin on day 1, 8, every 21 days on 12/21/18, along with Herceptin every 3 weeks. -Her PET imagesfrom 03/21/19 showmixed response to therapy of bone metastasis(improved L4 lesion, new pelvic bone lesions). No evidence of hypermetabolic soft tissue metastasis.  -I discussed her disease is starting to develop resistance to her chemo treatment. I switched her to next line treatment withNeratinib (Nerlynx).  Unfortunately she could not tolerate neratinib at dose of '160mg'$  with significant nausea and diarrhea, but so far has been able to tolerate 120 mg daily dose.   -Due to high copay she was not able to get Budesonide. She did not take Colestid due to large size of pills.  -We will continue Herceptin until the newly approved Margetuzimabbecomes available in our clinic, which I recommend.  -If she is able to tolerate Neratinib, I will add on tamoxifen.  -Labs reviewed and adequate to proceed with Herceptin today.  -Given tolerance will continue Neratinib at 132mdaily -F/u in 3 weeks    2. N&V, Diarrhea  -previously secondary to Eribulin, but now secondary to Neratinib  -She tolerated '120mg'$  dose well but '160mg'$  she stopped after 3 days due to N&V and liquid diarrhea hourly. -She notes Imodium did not help. She could not afford $1300 Budesonide. She has not taken the alternative Colestid because the pills are too large for her to swallow. .  -She will lomotil as  needed. She has manageable side effects on '120mg'$  dose Neratinib, will continue at same dose.     3.Body pain, secondary to bone mets, Hypocalcemia, leg cramps  -Since her cancer diagnosis,she has had b/l hip, shoulder and chest painat different time points, likely related to her bone mets -She declined Gabapentin given risk of suicidal thoughts. -She is being treated with Zometa q328month She does have hypocalcemia with leg cramps secondary to Zometa.  -She will continue oral calcium and Vit Dand Prilosec for Acid Reflux. -Current pain includes, lower back, left upper legand recently her right leg and shoulder. She has increased use to 5-'10mg'$  1-2 times a day.Her PET from 03/21/19 does indicate disease progression in her b/l pelvic lesions. -Since recent fall and soreness she increased oxycodone '5mg'$  to 1-2 tabs TID. I refilled 04/07/19  4.Congenital deafness  5.Mild anemia, secondary to chemotherapy and malignancy -She will start with oral iron daily for 1-2 months and then stop. She will also start daily women's vitamin. -Stable   6.Hypokalemia, Diarrhea -She developed diarrhea s/p cycle 3 Perjeta.Has been d/c -Ipreviouslystarted her onMag '400mg'$  daily and Oral potassium 1075m-3 times a day. If she cannot take 3 tabs I encouraged her to increase potassium in diet.  7.Goal of care discussion -she understand her goal of care is palliative to prolong her life -she is full code for now  8.Insomnia -She cannot take Benadryl due to allergy reaction. She has tried trazodone with no relief.  -She is on Xanax and Ambien 1-2  tabs nightly.I advised her to not take them at the same time.   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 She declined using ice bags using infusion -stable. Will watch on chemo.  10. Neutropenia, secondary to chemo -Seen after C4D1 with ANC 0.4 on 03/07/19 -She has been on Onpro on day 8 which has not been enough. I recommend Granix  injections the day after chemo, she is apprehensive due to needles and frequent visits.  -Due to mixed response, Eribulin was d/c.   11. Mildhyperbilirubinemia -likely second to chemotherapy, fluctuates  -Will monitor closely  12. Diffuse skin rash  -She notes after using 3 drops of CBD oil on her skin she has diffuse skin rash a few hours later. This was taken and started last night.  -She used cortisone cream on skin, she can continue. I will call in Claritin for her to use daily until rash is resolved. I will also call in tapering dose prednisone (04/14/19) to take over the next 4 days starting at 4 tabs.    PLAN: -I called in Claritin and Prednisone today  -Labs reviewed and adequate to proceed with Herceptin at '8mg'$ /m2 today.  -Lab, flush, F/u and Herceptin in 3 weeks, plan to add Tamoxifen next visit    No problem-specific Assessment & Plan notes found for this encounter.   No orders of the defined types were placed in this encounter.  All questions were answered. The patient knows to call the clinic with any problems, questions or concerns. No barriers to learning was detected. The total time spent in the appointment was 30 minutes.     Truitt Merle, MD 04/14/2019   I, Joslyn Devon, am acting as scribe for Truitt Merle, MD.   I have reviewed the above documentation for accuracy and completeness, and I agree with the above.

## 2019-04-14 ENCOUNTER — Inpatient Hospital Stay: Payer: Medicaid Other | Attending: Hematology | Admitting: Hematology

## 2019-04-14 ENCOUNTER — Inpatient Hospital Stay: Payer: Medicaid Other

## 2019-04-14 ENCOUNTER — Other Ambulatory Visit: Payer: Medicaid Other

## 2019-04-14 ENCOUNTER — Other Ambulatory Visit: Payer: Self-pay

## 2019-04-14 ENCOUNTER — Encounter: Payer: Self-pay | Admitting: Hematology

## 2019-04-14 ENCOUNTER — Ambulatory Visit: Payer: Medicaid Other

## 2019-04-14 VITALS — BP 115/76 | HR 94 | Temp 98.5°F | Resp 20 | Ht 65.0 in | Wt 132.0 lb

## 2019-04-14 DIAGNOSIS — C7951 Secondary malignant neoplasm of bone: Secondary | ICD-10-CM

## 2019-04-14 DIAGNOSIS — E876 Hypokalemia: Secondary | ICD-10-CM | POA: Diagnosis not present

## 2019-04-14 DIAGNOSIS — R21 Rash and other nonspecific skin eruption: Secondary | ICD-10-CM | POA: Insufficient documentation

## 2019-04-14 DIAGNOSIS — D6481 Anemia due to antineoplastic chemotherapy: Secondary | ICD-10-CM | POA: Diagnosis not present

## 2019-04-14 DIAGNOSIS — G620 Drug-induced polyneuropathy: Secondary | ICD-10-CM | POA: Insufficient documentation

## 2019-04-14 DIAGNOSIS — G4700 Insomnia, unspecified: Secondary | ICD-10-CM | POA: Insufficient documentation

## 2019-04-14 DIAGNOSIS — C50911 Malignant neoplasm of unspecified site of right female breast: Secondary | ICD-10-CM

## 2019-04-14 DIAGNOSIS — C50919 Malignant neoplasm of unspecified site of unspecified female breast: Secondary | ICD-10-CM | POA: Diagnosis not present

## 2019-04-14 DIAGNOSIS — H905 Unspecified sensorineural hearing loss: Secondary | ICD-10-CM | POA: Insufficient documentation

## 2019-04-14 DIAGNOSIS — Z5112 Encounter for antineoplastic immunotherapy: Secondary | ICD-10-CM | POA: Insufficient documentation

## 2019-04-14 DIAGNOSIS — Z170 Estrogen receptor positive status [ER+]: Secondary | ICD-10-CM | POA: Insufficient documentation

## 2019-04-14 DIAGNOSIS — D701 Agranulocytosis secondary to cancer chemotherapy: Secondary | ICD-10-CM | POA: Insufficient documentation

## 2019-04-14 DIAGNOSIS — Z95828 Presence of other vascular implants and grafts: Secondary | ICD-10-CM

## 2019-04-14 LAB — CBC WITH DIFFERENTIAL (CANCER CENTER ONLY)
Abs Immature Granulocytes: 0.01 10*3/uL (ref 0.00–0.07)
Basophils Absolute: 0 10*3/uL (ref 0.0–0.1)
Basophils Relative: 1 %
Eosinophils Absolute: 0.4 10*3/uL (ref 0.0–0.5)
Eosinophils Relative: 7 %
HCT: 36.3 % (ref 36.0–46.0)
Hemoglobin: 12.2 g/dL (ref 12.0–15.0)
Immature Granulocytes: 0 %
Lymphocytes Relative: 14 %
Lymphs Abs: 0.9 10*3/uL (ref 0.7–4.0)
MCH: 29.5 pg (ref 26.0–34.0)
MCHC: 33.6 g/dL (ref 30.0–36.0)
MCV: 87.7 fL (ref 80.0–100.0)
Monocytes Absolute: 0.3 10*3/uL (ref 0.1–1.0)
Monocytes Relative: 5 %
Neutro Abs: 4.7 10*3/uL (ref 1.7–7.7)
Neutrophils Relative %: 73 %
Platelet Count: 203 10*3/uL (ref 150–400)
RBC: 4.14 MIL/uL (ref 3.87–5.11)
RDW: 13.7 % (ref 11.5–15.5)
WBC Count: 6.4 10*3/uL (ref 4.0–10.5)
nRBC: 0 % (ref 0.0–0.2)

## 2019-04-14 LAB — CMP (CANCER CENTER ONLY)
ALT: 16 U/L (ref 0–44)
AST: 19 U/L (ref 15–41)
Albumin: 3.7 g/dL (ref 3.5–5.0)
Alkaline Phosphatase: 71 U/L (ref 38–126)
Anion gap: 11 (ref 5–15)
BUN: 13 mg/dL (ref 6–20)
CO2: 22 mmol/L (ref 22–32)
Calcium: 9.3 mg/dL (ref 8.9–10.3)
Chloride: 109 mmol/L (ref 98–111)
Creatinine: 0.95 mg/dL (ref 0.44–1.00)
GFR, Est AFR Am: >60 mL/min (ref >60)
GFR, Estimated: >60 mL/min (ref >60)
Glucose, Bld: 86 mg/dL (ref 70–99)
Potassium: 3.1 mmol/L — ABNORMAL LOW (ref 3.5–5.1)
Sodium: 142 mmol/L (ref 135–145)
Total Bilirubin: 1.7 mg/dL — ABNORMAL HIGH (ref 0.3–1.2)
Total Protein: 6.6 g/dL (ref 6.5–8.1)

## 2019-04-14 LAB — MAGNESIUM: Magnesium: 1.7 mg/dL (ref 1.7–2.4)

## 2019-04-14 MED ORDER — DEXAMETHASONE 4 MG PO TABS
8.0000 mg | ORAL_TABLET | Freq: Once | ORAL | Status: AC
  Administered 2019-04-14: 8 mg via ORAL

## 2019-04-14 MED ORDER — LORATADINE 10 MG PO TABS: 10.0000 mg | ORAL_TABLET | Freq: Every day | ORAL | 0 refills | Status: DC

## 2019-04-14 MED ORDER — ACETAMINOPHEN 325 MG PO TABS
ORAL_TABLET | ORAL | Status: AC
  Filled 2019-04-14: qty 2

## 2019-04-14 MED ORDER — PREDNISONE 10 MG PO TABS: 10.0000 mg | ORAL_TABLET | Freq: Every day | ORAL | 0 refills | Status: DC

## 2019-04-14 MED ORDER — SODIUM CHLORIDE 0.9% FLUSH
10.0000 mL | Freq: Once | INTRAVENOUS | Status: AC
  Administered 2019-04-14: 10 mL
  Filled 2019-04-14: qty 10

## 2019-04-14 MED ORDER — LORATADINE 10 MG PO TABS
10.0000 mg | ORAL_TABLET | Freq: Every day | ORAL | Status: DC
  Administered 2019-04-14: 10 mg via ORAL

## 2019-04-14 MED ORDER — LORATADINE 10 MG PO TABS
ORAL_TABLET | ORAL | Status: AC
  Filled 2019-04-14: qty 1

## 2019-04-14 MED ORDER — HEPARIN SOD (PORK) LOCK FLUSH 100 UNIT/ML IV SOLN
500.0000 [IU] | Freq: Once | INTRAVENOUS | Status: AC | PRN
  Administered 2019-04-14: 500 [IU]
  Filled 2019-04-14: qty 5

## 2019-04-14 MED ORDER — SODIUM CHLORIDE 0.9 % IV SOLN
Freq: Once | INTRAVENOUS | Status: AC
  Filled 2019-04-14: qty 250

## 2019-04-14 MED ORDER — LIDOCAINE-PRILOCAINE 2.5-2.5 % EX CREA: 1.0000 "application " | TOPICAL_CREAM | CUTANEOUS | 2 refills | Status: DC | PRN

## 2019-04-14 MED ORDER — TRASTUZUMAB-DKST CHEMO 150 MG IV SOLR
8.0000 mg/kg | Freq: Once | INTRAVENOUS | Status: AC
  Administered 2019-04-14: 504 mg via INTRAVENOUS
  Filled 2019-04-14: qty 24

## 2019-04-14 MED ORDER — ACETAMINOPHEN 325 MG PO TABS
650.0000 mg | ORAL_TABLET | Freq: Once | ORAL | Status: AC
  Administered 2019-04-14: 650 mg via ORAL

## 2019-04-14 MED ORDER — DEXAMETHASONE 4 MG PO TABS
ORAL_TABLET | ORAL | Status: AC
  Filled 2019-04-14: qty 2

## 2019-04-14 MED ORDER — SODIUM CHLORIDE 0.9% FLUSH
10.0000 mL | INTRAVENOUS | Status: DC | PRN
  Administered 2019-04-14 (×2): 10 mL
  Filled 2019-04-14: qty 10

## 2019-04-14 NOTE — Progress Notes (Signed)
Per Dr. Burr Medico, okay for patient to receive treatment today with total bilirubin 1.7/

## 2019-04-14 NOTE — Progress Notes (Signed)
May infuse Herceptin over 30 min.  Kennith Center, Pharm.D., CPP 04/14/2019@2 :48 PM

## 2019-04-14 NOTE — Patient Instructions (Signed)

## 2019-04-14 NOTE — Patient Instructions (Signed)
[facility] Discharge Instructions for Patients Receiving Chemotherapy  Today you received the following chemotherapy agents: Trastuzumab, [place]  To help prevent nausea and vomiting after your treatment, we encourage you to take your nausea medication as directed.   If you develop nausea and vomiting that is not controlled by your nausea medication, call the clinic.   BELOW ARE SYMPTOMS THAT SHOULD BE REPORTED IMMEDIATELY:  *FEVER GREATER THAN 100.5 F  *CHILLS WITH OR WITHOUT FEVER  NAUSEA AND VOMITING THAT IS NOT CONTROLLED WITH YOUR NAUSEA MEDICATION  *UNUSUAL SHORTNESS OF BREATH  *UNUSUAL BRUISING OR BLEEDING  TENDERNESS IN MOUTH AND THROAT WITH OR WITHOUT PRESENCE OF ULCERS  *URINARY PROBLEMS  *BOWEL PROBLEMS  UNUSUAL RASH Items with * indicate a potential emergency and should be followed up as soon as possible.  Feel free to call the clinic should you have any questions or concerns. The clinic phone number is [phone].  Please show the [place] at check-in to the Emergency Department and triage nurse.

## 2019-04-28 ENCOUNTER — Telehealth: Payer: Self-pay

## 2019-04-28 ENCOUNTER — Other Ambulatory Visit: Payer: Self-pay

## 2019-04-28 DIAGNOSIS — E876 Hypokalemia: Secondary | ICD-10-CM

## 2019-04-28 MED ORDER — POTASSIUM CHLORIDE ER 10 MEQ PO TBCR: 30.0000 meq | EXTENDED_RELEASE_TABLET | Freq: Every day | ORAL | 2 refills | Status: DC

## 2019-04-28 NOTE — Telephone Encounter (Signed)
[name] called stating that she is having a hard time with her Nerlynx including, nausea, vomiting, diarrhea.  She states she has been loosing weight as well.  She is not going to take the Nerlynx over the weekend.  She will resume taking on Monday.  She also requests a refill for potassium.  Dr. Burr Medico is aware.

## 2019-05-01 NOTE — Progress Notes (Signed)
Pharmacist Chemotherapy Monitoring - Follow Up Assessment    I verify that I have reviewed each item in the below checklist:  . Regimen for the patient is scheduled for the appropriate day and plan matches scheduled date. Marland Kitchen Appropriate non-routine labs are ordered dependent on drug ordered. . If applicable, additional medications reviewed and ordered per protocol based on lifetime cumulative doses and/or treatment regimen.   Plan for follow-up and/or issues identified: Yes . I-vent associated with next due treatment: Yes . MD and/or nursing notified: No  Delane Ginger 05/01/2019 2:17 PM

## 2019-05-01 NOTE — Progress Notes (Signed)
[place]   [phone] [phone]   Clinic Follow up Note   Patient Care Team: [name], [name], [name] as PCP - General (Physician Assistant) [name], MD as Consulting Physician (Orthopedic Surgery)  Date of Service:  05/05/2019  CHIEF COMPLAINT: F/u ofmetastatic breast cancer  SUMMARY OF ONCOLOGIC HISTORY: Oncology History Overview Note   Cancer Staging No matching staging information was found for the patient.     [name], right ([place])  11/1997 Cancer Diagnosis   Patient had 1.4 cm poorly differentiated right breast carcinoma diagnosed in Nov 1999 at age 54, 1/7 axillary nodes involved, ER/PR and HER 2 positive. She had mastectomy with the 7 axillary node evaluation, 4 cycles of adriamycin/ cytoxan followed by taxotere, then five years of tamoxifen thru June 2005 (no herceptin in 1999). She had bilateral oophorectomy in May 2005. She was briefly on aromatase inhibitor after tamoxifen, but discontinued this herself due to poor tolerance.   04/04/2015 Genetic Testing   Negative genetic testing on the breast/ovarian cancer pnael.  The Breast/Ovarian gene panel offered by [facility] includes sequencing and rearrangement analysis for the following 20 genes:  ATM, BARD1, BRCA1, BRCA2, BRIP1, CDH1, CHEK2, EPCAM, FANCC, MLH1, MSH2, MSH6, NBN, PALB2, PMS2, PTEN, RAD51C, RAD51D, TP53, and XRCC2.   06/26/2015 Pathology Results   Initial Path Report Bone, curettage, Left femur met, intramedullary subtroch tissue - METASTATIC ADENOCARCINOMA.  Estrogen Receptor: 95%, POSITIVE, STRONG STAINING INTENSITY Progesterone Receptor: 2%, POSITIVE, STRONG  HER2 (+), IHC 3+   06/26/2015 Surgery   Biopsy of metastatic tissue and stabilization with Affixus trochanteric nail, proximal and distal interlock of left femur by Dr. [name]    06/28/2015 Imaging   CT Chest w/ Contrast 1. Extensive right internal mammary chain  lymphadenopathy consistent with metastatic breast cancer. 2. Lytic metastases involving the posterior elements at T1 with probable nondisplaced pathologic fracture of the spinous process. 3. No other evidence of thoracic metastatic disease. 4. Trace bilateral pleural effusions with associated bibasilar atelectasis. 5. Indeterminate right thyroid nodule, not previously imaged. This could be evaluated with thyroid ultrasound as clinically warranted.   06/29/2015 -  Chemotherapy   Zometa started monthly on 06/29/15 and switched to q5 month from 01/08/17   07/01/2015 Imaging   Bone Scan 1. Increased activity noted throughout the left femur. Although metastatic disease cannot be excluded, these changes are most likely secondary to prior surgery. 2. No other focal abnormalities identified to suggest metastatic disease.   07/12/2015 - 07/26/2015 Radiation Therapy   Left femur, 30 Gy in 10 fractions by Dr. [name]   07/14/2015 Initial Diagnosis   [name]   07/19/2015 Imaging   Initial PET Scan 1. Severe multifocal osseous metastatic disease, much greater than anticipated based on prior imaging. 2. Multifocal nodal metastases involving the right internal mammary, prevascular and subpectoral lymph nodes. No axillary pulmonary involvement identified. 3. No distant extra osseous metastases.   07/30/2015 - 11/29/2015 Chemotherapy   Docetaxel, Herceptin and Perjeta every 3 weeks X6 cycles, pt tolerated moderately well, restaging scan showed excellent response    09/18/2015 Imaging   CT Head w/wo Contrast 1. No acute intracranial abnormality or significant interval change. 2. Stable minimal periventricular white matter hypoattenuation on the right. This may reflect a remote ischemic injury. 3. No evidence for metastatic disease to the brain. 4. No focal soft tissue lesion to explain the patient's tenderness.   10/30/2015 Imaging   CT  Abdomen Pelvis w/ Contrast  1. Few mildly prominent fluid-filled loops of small bowel scattered throughout the abdomen, with intraluminal fluid density within the distal colon. Given the provided history, findings are suggestive of acute enteritis/diarrheal illness. 2. No other acute intra-abdominal or pelvic process identified. 3. Widespread osseous metastatic disease, better evaluated on most recent PET-CT from 07/19/2015. No pathologic fracture or other complication. 4. 11 mm hypodensity within the left kidney. This lesion measures intermediate density, and is indeterminate. While this lesion is similar in size relative to recent studies, this is increased in size relative to prior study from 2012. Further evaluation with dedicated renal mass protocol CT and/or MRI is recommended for complete characterization.   12/2015 - 05/14/2016 Chemotherapy   Maintenance Herceptin and pejeta every 3 weeks stopped on 05/14/16 due to disease progression   12/25/2015 Imaging   Restaging PET Scan 1. Markedly improved skeletal activity, with only several faint foci of residual accentuated metabolic activity, but resolution of the vast majority of the previously extensive osseous metastatic disease. 2. Resolution of the prior right internal mammary and right prevascular lymph nodes. 3. Residual subcutaneous edema and edema along fascia planes in the left upper thigh. This remains somewhat more than I would expect for placement of an IM nail 6 weeks ago. If there is leg swelling further down, consider Doppler venous ultrasound to rule out left lower extremity DVT. I do not perceive an obvious difference in density between the pelvic and common femoral veins on the noncontrast CT data. 4. Chronic right maxillary and right sphenoid sinusitis.   01/16/2016 - 10/20/2017 Anti-estrogen oral therapy   Letrozole 2.5 mg daily. She was switched to exemestane due to disease progression on 02/08/17. Stopped on 10/20/2017 when treatment changed to chemo     03/01/2016 Miscellaneous   Patient presented to ED following a fall; she reports she landed on her back and is now experiencing back pain. The patient was evaluated and discharge home same day with pain control.   05/01/2016 Imaging   CT CAP IMPRESSION: 1. Stable exam.  No new or progressive disease identified. 2. No evidence for residual or recurrent adenopathy within the chest. 3. Stable bone metastasis.   05/01/2016 Imaging   BONE SCAN IMPRESSION: 1. Small focal uptake involving the anterior rib ends of the left second and right fourth ribs, new since the prior bone scan. The location of this uptake is more suggestive of a traumatic or inflammatory etiology as opposed to metastatic disease. No other evidence of metastatic disease. 2. There are other areas of stable uptake which are most likely degenerative/reactive, including right shoulder and cervical spine uptake and uptake along the right femur adjacent to the ORIF hardware.   05/20/2016 Imaging   NM PET Skull to Thigh IMPRESSION: 1. There multiple metastatic foci in the bony pelvis, including some new abnormal foci compare to the prior PET-CT, compatible with mildly progressive bony metastatic disease. Also, a left T11 vertebral hypermetabolic metastatic lesion is observed, new compared to the prior exam. 2. No active extraosseous malignancy is currently identified. 3. Right anterior fourth rib healing fracture, appears be   06/04/2016 - 01/08/2017 Chemotherapy   Second line chemotherapy Kadcyla every 3 weeks started on 06/04/16 and stopped on 01/08/17 due to mixed response.     10/05/2016 Imaging   CT CAP and Bone Scan:  Bones: left intramedullary rod and left femoral head neck screw in place. In the proximal diaphysis of the left femur there is cortical thickening and irregularity.  No lytic or blastic bone lesions. No fracture or vertebral endplate destruction.   No definite evidence of recurrent disease in the chest,  abdomen, or pelvis.   10/28/2016 Imaging   CT A/P IMPRESSION: No acute process demonstrated in the abdomen or pelvis. No evidence of bowel obstruction or inflammation.   11/04/2016 Imaging   DG foot complete left: IMPRESSION: No fracture or dislocation.  No soft tissue abnormality   11/23/2016 Imaging   CT RIGHT FEMUR IMPRESSION: Negative CT scan of the right thigh.  No visible metastatic disease.  CT LEFT FEMUR IMPRESSION: 1. Postsurgical changes related to prior cephalomedullary rod fixation of the left femur with linear lucency along the anterolateral proximal femoral cortex at level of the lesser trochanter, suspicious for nondisplaced fracture. 2. Slightly permeative appearance of the proximal left femoral cortex at the site of known prior osseous metastases is largely unchanged. 3. Unchanged patchy lucency and sclerosis along the anterior acetabulum, which appears to correspond with an area of faint uptake on the prior PET-CT, suspicious for osseous metastasis. These results will be called to the ordering clinician or representative by the Radiologist Assistant, and communication documented in the PACS or zVision Dashboard.     01/27/2017 Imaging   IMPRESSION: 1. Mixed response to osseous metastasis. Some hypermetabolic lesions are new and increasingly hypermetabolic, while another index lesion is decreasingly hypermetabolic. 2. No evidence of hypermetabolic soft tissue metastasis.    02/08/2017 - 10/19/2017 Antibody Plan   -Herceptin every 3 week since 02/08/17 -Oral Lanpantinib '1500mg'$  and decreased to '500mg'$  due to diarrhea and depression. Due to disease progression she swithced to Verzenio '150mg'$  on 05/04/17. She did not toelrate well so she was switched to Ibrance '100mg'$  on 05/31/17. Due to her neutropenia, Ibrance dose reduced to 75 mg daily, 3 weeks on, one-week off, stopped on 10/20/2017 due to disease progression.    04/30/2017 PET scan   IMPRESSION: 1. Primarily  increase in hypermetabolism of osseous metastasis. An isolated right acetabular lesion is decreased in hypermetabolism since the prior exam. 2. No evidence of hypermetabolic soft tissue metastasis. 3. Similar non FDG avid right-sided thyroid nodule, favoring a benign etiology. Recommend attention on follow-up.   08/30/2017 PET scan   08/30/2017 PET Scan  IMPRESSION: 1. Worsening osseous metastatic disease. 2. Focal hypermetabolism in the central spinal canal the L1 level, similar to the prior exam. Metastatic implant cannot be excluded. 3. Slight enlargement of a right thyroid nodule which now appears more cystic in character. Consider further evaluation with thyroid ultrasound. If patient is clinically hyperthyroid, consider nuclear medicine thyroid uptake and scan   08/30/2017 Progression   08/30/2017 PET Scan  IMPRESSION: 1. Worsening osseous metastatic disease. 2. Focal hypermetabolism in the central spinal canal the L1 level, similar to the prior exam. Metastatic implant cannot be excluded. 3. Slight enlargement of a right thyroid nodule which now appears more cystic in character. Consider further evaluation with thyroid ultrasound. If patient is clinically hyperthyroid, consider nuclear medicine thyroid uptake and scan   09/29/2017 - 10/11/2017 Radiation Therapy   Pt received 27 Gy in 9 fractions directed at the areas of significant uptake noted on recent bone scan the right pelvis and right proximal femur, with Dr, Sondra Come    10/20/2017 - 04/07/2018 Chemotherapy   -Herceptin every 3 weeks starting 02/08/17, perjeta added on 10/20/2017  -weekly Taxol started on 10/21/2017. Will reduce Taxol to 2 weeks on, 1 week off starting on 02/04/17.  Stoped due to mild disease progression   10/27/2017  Imaging   10/27/2017 CT AP IMPRESSION: 1. No acute abnormality. No evidence of bowel obstruction or acute bowel inflammation. Normal appendix. 2. Stable patchy sclerotic osseous metastases  throughout the visualized skeleton. No new or progressive metastatic disease in the abdomen or pelvis.   01/11/2018 Imaging   Whole Body Bone Scan 01/11/18  IMPRESSION: 1. Multifocal osseous metastases as described. 2. Right scapular metastasis. 3. Right superior thoracic spinous process. 4. Right sacral ala and right L5 metastases. 5. Right acetabular metastasis. 6. Right proximal femur metastasis. 7. Diffuse metastases throughout the left femur. 8. Anterior right fourth rib metastasis.   01/11/2018 Imaging   CT CAP W Contrast 01/11/18  IMPRESSION: 1. Similar-appearing osseous metastatic disease. 2. No evidence for progressed metastatic disease in the chest, abdomen or pelvis.   03/30/2018 Imaging   Bone Scan 03/30/18  IMPRESSION: Stable multifocal bony metastatic disease.   03/30/2018 Imaging    CT CAP W Contrast 03/30/18 IMPRESSION: 1. New mild contiguous wall thickening with associated mucosal hyperenhancement throughout the left colon and rectum, suggesting nonspecific infectious or inflammatory proctocolitis. Differential includes C diff colitis. 2. Stable faint patchy sclerosis in the iliac bones and lumbar vertebral bodies. No new or progressive osseous metastatic disease by CT. Please note that the osseous lesions are relatively occult by CT and would be better evaluated by PET-CT, as clinically warranted. 3. No evidence of extra osseous metastatic disease.  These results will be called to the ordering clinician or representative by the Radiologist Assistant, and communication documented in the PACS or zVision Dashboard.   04/15/2018 - 06/16/2018 Chemotherapy   Enhertu q3weeks starting 04/15/18. Stopped Enhertu after cycle 3 on 05/27/18 due to poor toleration due to poor toleration with anorexia, weight loss, N&V, diarrhea   04/21/2018 PET scan   PET 04/21/18  IMPRESSION: 1. Overall marked interval improvement in bony hypermetabolic disease. All of the previously  identified hypermetabolic osseous metastases have decreased in hypermetabolism in the interval. There is a new tiny focus of hypermetabolism in the right aspect of the L3 vertebral body that appears to correspond to a new 8 mm lucency, raising concern for new metastatic disease. Close attention on follow-up recommended. 2. Focal hypermetabolism in the central spinal canal at the level of L1 previously has resolved. 3. No hypermetabolic soft tissue disease on today's study. 4. Right thyroid nodule seen previously has decreased in the interval.    06/16/2018 Imaging   CT LUMBAR SPINE WO CONTRAST 06/16/18 IMPRESSION: 1. Transitional lumbosacral anatomy. Correlation with radiographs is recommended prior to any operative intervention. 2. Suspicion of a right L3 vertebral body metastasis on the April PET-CT is not evident by CT. Stable degenerative versus metastatic lesion at the right L5 superior endplate. Small but increased right iliac bone metastasis since March. Lumbar MRI would best characterize lumbar metastatic disease (without contrast if necessary). 3. Suspect L2-L3 left subarticular segment disc herniation. Query left L2 radiculitis. 4. Stable appearance of rightward L4-L5 disc degeneration which could affect the right foramen and right lateral recess.   06/17/2018 - 11/16/2018 Chemotherapy   Restart Taxol 2 weeks on/1 week off with Herceptin and Perjeta q3weeks on 06/17/18. Perjeta was stopped after C5 due to diarrhea. Stopped Taxol after 11/16/18 due to disease progression to L3.    07/06/2018 PET scan   PET  IMPRESSION: 1. Near total resolution of hypermetabolic activity associated with skeletal metastasis. Very mild activity associated with the LEFT iliac bone which is similar to background blood pool activity. 2. No new or  progressive disease.   11/21/2018 PET scan   IMPRESSION: 1. New focal hypermetabolic lesion eccentric to the right in the L3 vertebral body, maximum SUV  6.1 compatible with a new focus of active malignancy. 2. Very low-grade activity similar to blood pool in previous existing mildly sclerotic metastatic lesions including the left iliac bone lesion. Some of the mildly sclerotic lesions are not hypermetabolic compatible with effectively treated bony metastatic disease. 3. No hypermetabolic soft tissue lesions are identified.     11/30/2018 -  Chemotherapy   Continue Herceptin q3weeks    12/12/2018 - 12/17/2018 Chemotherapy   Tucatinib (TUKYSA) 6 tabs BID as a next line therapy starting 12/12/18. Stopped 12/17/18 duet o N&V, poor appetite and HA    12/21/2018 - 03/17/2019 Chemotherapy   IV Eribulin on day 1, 8 every 21 days starting 12/21/18. Stopped 03/17/19 due to mixed response to treatment.    03/21/2019 PET scan   IMPRESSION: 1. Mixed response to therapy of osseous metastasis. Although a right-sided L4 lesion demonstrates decreased hypermetabolism today, new left iliac and right ischial hypermetabolic lesions are seen. 2. No evidence of hypermetabolic soft tissue metastasis.     03/27/2019 -  Chemotherapy   Neratinib (Nerlynx) titrate up from 176m to 2472mstarting 03/27/19. She could not tolerate 16064mafter taking for 2 days) and stopped on 02/05/19 due to N/V/D. Restarted at 120m33m 04/10/18.       CURRENT THERAPY:  -Zometa q3mon52monthntinue Herceptin q3weeks -Neratinib (Nerlynx)titrate up from 120mg 59m40mg s88ming3/15/21.She could not tolerate160mgand89mpped on 02/05/19 due to N&V and diarrhea. Will restart at 120mg on 46m/20-4/15/21. 4/19-4/22. Will reduce to 80mg next68mk.    INTERVAL HISTORY:  Vear C EtSKYELAR HALLIDAYor a follow up. She presents to the clinic with her Interpretor. She had another bout of nausea and diarrhea with Nerlynx. She had stool incontinence. She notes she held it for the last weekend and restarted 4 days ago starting 120mg. She 97mnot taken today's dose. She notes her main issues is  diarrhea and has been taking lomotil but not much. She currently denies any other pain or discomfort. She notes she has a breakfast drink in the morning. She notes she was on oral Mag but stopped due to nausea. She was out of oral potassium and was able to receive refill this week.   REVIEW OF SYSTEMS:   Constitutional: Denies fevers, chills or abnormal weight loss (+) Lower appetite  Eyes: Denies blurriness of vision Ears, nose, mouth, throat, and face: Denies mucositis or sore throat Respiratory: Denies cough, dyspnea or wheezes Cardiovascular: Denies palpitation, chest discomfort or lower extremity swelling Gastrointestinal:  (+) Nausea (+) Diarrhea  Skin: Denies abnormal skin rashes Lymphatics: Denies new lymphadenopathy or easy bruising Neurological:Denies numbness, tingling or new weaknesses Behavioral/Psych: Mood is stable, no new changes  All other systems were reviewed with the patient and are negative.  MEDICAL HISTORY:  Past Medical History:  Diagnosis Date  . Abnormal Pap smear   . Anxiety   . Breast cancer (HCC)   . [place] ([facility]) [place] chemo, rad  . CIN I (cervical intraepithelial neoplasia I) 2003   by colpo  . Congenital deafness   . Deaf   . Depression   . Port-A-Cath in place 2017   for chemotherapy  . Radiation 07/12/15-07/26/15   left femur 30 Gy  . S/P bilateral oophorectomy     SURGICAL HISTORY: Past Surgical History:  Procedure Laterality Date  .  ABDOMINAL HYSTERECTOMY    . INTRAMEDULLARY (IM) NAIL INTERTROCHANTERIC Left 06/26/2015   Procedure: LEFT BIOMET LONG AFFIXS NAIL;  Surgeon: [name], MD;  Location: [facility];  Service: Orthopedics;  Laterality: Left;  . IR GENERIC HISTORICAL  08/06/2015   IR US GUIDE VASC ACCESS LEFT 08/06/2015 Aletta Edouard, MD [facility]  . IR GENERIC HISTORICAL  08/06/2015   IR FLUORO GUIDE CV LINE LEFT 08/06/2015 Aletta Edouard, MD [facility]  . IR GENERIC HISTORICAL  03/05/2016   IR CV LINE  INJECTION 03/05/2016 Markus Daft, MD [facility]  . LEFT HEART CATH AND CORONARY ANGIOGRAPHY N/A 12/13/2017   Procedure: LEFT HEART CATH AND CORONARY ANGIOGRAPHY;  Surgeon: Leonie Man, MD;  Location: [facility];  Service: Cardiovascular;  Laterality: N/A;  . MASTECTOMY     right breast  . MASTECTOMY    . OVARIAN CYST REMOVAL    . RADIOLOGY WITH ANESTHESIA Left 06/25/2015   Procedure: MRI OF LEFT HIP WITH OR WITHOUT CONTRAST;  Surgeon: Medication Radiologist, MD;  Location: [facility];  Service: Radiology;  Laterality: Left;  DR. MCINTYRE/MRI  . TUBAL LIGATION      I have reviewed the social history and family history with the patient and they are unchanged from previous note.  ALLERGIES:  is allergic to promethazine hcl; chlorhexidine; and diphenhydramine hcl.  MEDICATIONS:  Current Outpatient Medications  Medication Sig Dispense Refill  . albuterol (PROAIR HFA) 108 (90 Base) MCG/ACT inhaler Inhale 2 puffs into the lungs every 6 (six) hours as needed for wheezing or shortness of breath.    Marland Kitchen albuterol (PROVENTIL HFA;VENTOLIN HFA) 108 (90 Base) MCG/ACT inhaler Inhale 2 puffs into the lungs every 6 (six) hours as needed for wheezing or shortness of breath. 1 Inhaler 0  . ALPRAZolam (XANAX) 0.25 MG tablet Take 1 tablet (0.25 mg total) by mouth 2 (two) times daily as needed for anxiety. 30 tablet 0  . calcium carbonate (TUMS - DOSED IN MG ELEMENTAL CALCIUM) 500 MG chewable tablet Chew 1 tablet by mouth as needed.    . diphenoxylate-atropine (LOMOTIL) 2.5-0.025 MG tablet Take 1-2 tablets by mouth 4 (four) times daily as needed for diarrhea or loose stools. 60 tablet 1  . gabapentin (NEURONTIN) 100 MG capsule Take 1 capsule (100 mg total) by mouth at bedtime. 60 capsule 0  . lidocaine-prilocaine (EMLA) cream Apply 1 application topically as needed. Apply to Porta-Cath 1-2 hours prior to access as directed. 90 g 2  . loratadine (CLARITIN) 10 MG tablet Take 1 tablet (10 mg total) by mouth  daily. 30 tablet 0  . magnesium oxide (MAG-OX) 400 (241.3 Mg) MG tablet Take 1 tablet (400 mg total) by mouth daily. 30 tablet 3  . methocarbamol (ROBAXIN) 500 MG tablet Take 1 tablet (500 mg total) by mouth 2 (two) times daily. (Patient taking differently: Take 500 mg by mouth 2 (two) times daily as needed for muscle spasms. ) 20 tablet 0  . Neratinib Maleate (NERLYNX) 40 MG tablet Take 2 tablets (80 mg total) by mouth daily. Take with food. 40 tablet 0  . ondansetron (ZOFRAN) 8 MG tablet Take 1 tablet (8 mg total) by mouth every 8 (eight) hours as needed for nausea or vomiting. 45 tablet 1  . oxyCODONE (ROXICODONE) 5 MG immediate release tablet Take 1 tablet (5 mg total) by mouth every 8 (eight) hours as needed for severe pain. 60 tablet 0  . potassium chloride (KLOR-CON) 10 MEQ tablet Take 3 tablets (30 mEq  total) by mouth daily. 90 tablet 2  . predniSONE (DELTASONE) 10 MG tablet Take 1 tablet (10 mg total) by mouth daily with breakfast. Take 4 tab for first day, then decrease 1 tab daily until she finishes 10 tablet 0  . prochlorperazine (COMPAZINE) 10 MG tablet Take 1 tablet (10 mg total) by mouth 2 (two) times daily as needed for nausea or vomiting. 10 tablet 0  . zolpidem (AMBIEN) 5 MG tablet Take 1-2 tablets (5-10 mg total) by mouth at bedtime as needed. 60 tablet 0   No current facility-administered medications for this visit.   Facility-Administered Medications Ordered in Other Visits  Medication Dose Route Frequency Provider Last Rate Last Admin  . heparin lock flush 100 unit/mL  500 Units Intracatheter Once PRN Truitt Merle, MD      . loratadine (CLARITIN) tablet 10 mg  10 mg Oral Daily Truitt Merle, MD   10 mg at 05/05/19 0942  . potassium chloride SA (K-DUR) CR tablet 40 mEq  40 mEq Oral Once Truitt Merle, MD      . sodium chloride 0.9 % 1,000 mL with potassium chloride 10 mEq infusion   Intravenous Continuous Gordy Levan, MD   Stopped at 09/10/15 1653  . sodium chloride 0.9 % 500 mL  with potassium chloride 10 mEq, magnesium sulfate 2 g infusion   Intravenous Once Truitt Merle, MD      . sodium chloride 0.9 % injection 10 mL  10 mL Intravenous PRN Livesay, Lennis P, MD      . sodium chloride flush (NS) 0.9 % injection 10 mL  10 mL Intracatheter PRN Truitt Merle, MD        PHYSICAL EXAMINATION: ECOG PERFORMANCE STATUS: 1 - Symptomatic but completely ambulatory  Vitals:   05/05/19 0831  BP: 122/76  Pulse: 65  Resp: 17  Temp: 97.8 F (36.6 C)  SpO2: 100%   Filed Weights   05/05/19 0831  Weight: 132 lb 14.4 oz (60.3 kg)    Due to COVID19 we will limit examination to appearance. Patient had no complaints.  GENERAL:alert, no distress and comfortable SKIN: skin color normal, no rashes or significant lesions EYES: normal, Conjunctiva are pink and non-injected, sclera clear  NEURO: alert & oriented x 3 with fluent speech   LABORATORY DATA:  I have reviewed the data as listed CBC Latest Ref Rng & Units 05/05/2019 04/14/2019 03/27/2019  WBC 4.0 - 10.5 K/uL 3.6(L) 6.4 4.4  Hemoglobin 12.0 - 15.0 g/dL 10.8(L) 12.2 10.9(L)  Hematocrit 36.0 - 46.0 % 31.7(L) 36.3 32.9(L)  Platelets 150 - 400 K/uL 179 203 200     CMP Latest Ref Rng & Units 05/05/2019 04/14/2019 03/27/2019  Glucose 70 - 99 mg/dL 88 86 116(H)  BUN 6 - 20 mg/dL _0 Creatinine 0.44 - 1.00 mg/dL 0.83 0.95 0.87  Sodium 135 - 145 mmol/L 145 142 143  Potassium 3.5 - 5.1 mmol/L 2.7(LL) 3.1(L) 3.3(L)  Chloride 98 - 111 mmol/L 107 109 108  CO2 22 - 32 mmol/L _1 Calcium 8.9 - 10.3 mg/dL 8.6(L) 9.3 8.7(L)  Total Protein 6.5 - 8.1 g/dL 6.0(L) 6.6 6.2(L)  Total Bilirubin 0.3 - 1.2 mg/dL 1.8(H) 1.7(H) 1.8(H)  Alkaline Phos 38 - 126 U/L 59 71 59  AST 15 - 41 U/L 14(L) 19 21  ALT 0 - 44 U/L 15 16 32      RADIOGRAPHIC STUDIES: I have personally reviewed the radiological images as listed and agreed with the  findings in the report. No results found.   ASSESSMENT & PLAN:  [name] is a 54 y.o. 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 She had right mastectomy, 4 cycles of AC-T and 5 years of Tamoxifen/AI.  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 She has received Target RT. She had excellent response to First-line Docetaxel/Herceptin/Perjeta and AI but progressed on maintenance therapy. She had mixed response on second line Kadcyla, progressed on third-line Ibrance/Herceptin and poorly tolerated Enhertu.  -She triedTucatinib (TUKYSA)with low doseon 12/12/18 however due to N&V, poor appetite and HA she stopped on 12/17/18 after trying twice. -I started her on next lineivchemoEribulin on day 1, 8, every 21 days on 12/21/18, along with Herceptin every 3 weeks. -HerPET imagesfrom 03/21/19 showmixed response to therapy of bone metastasis(improvedL4 lesion, new pelvic bone lesions). No evidence of hypermetabolic soft tissue metastasis.  -I discussed her disease is starting to develop resistance to her chemo treatment. Iswitched herto next line treatmentwithNeratinib(Nerlynx). I she is able to tolerateNeratinib, I will add on tamoxifen. -Unfortunately she could not tolerate neratinib at dose of 166m with significant nausea and diarrhea, but so far has been able to tolerate 120 mg daily dose.   -Due to high copay she was not able to getBudesonide. She did not take Colestid due to large size of pills.  -We will continue Herceptin until thenewly approvedMargetuzimabbecomes available in our clinic, which I recommend. -She stopped Nerlynx 4/16-4/18 due to more N&V and diarrhea even on 1249mdose. She restarted on 05/01/19 at 12098m -She stopped Nerlynx again today. Will have her try 3m71mrlynx on 4/26. If still not tolerable we may have to switch to another treatment. She voiced good understanding and I recommend she My chart message me when  she restarts and updates me on how she tolerates her.  -Labs reviewed, WBC 3.6, Hg 10.8, K 2.7, ca 8.6, protein 6, AST 14, Mag 1.5. Overall adequate to proceed with Herceptin today and continue every 3 weeks.  -F/u in 3 weeks   2. N&V, Diarrhea -previouslysecondary to Eribulin, but now secondary to Neratinib  -She tolerated 120mg70me well but 160mg 29mstopped after 3 days due to N&V and liquid diarrhea hourly. -She notes Imodium did not help. She could not afford $1300 Budesonide. She has not taken the alternative Colestid because the pills are too large for her to swallow.  -She initially had manageable side effects on 120mg d65mNeratinib but N/V/D recurred with stool incontinence. She took last weekend off and restarted at 120mg on46m9. Will hold starting today and will dose reduce to 3mg sta74mg next week.  -I reviewed Lomotil use to control her diarrhea. She can use up to 2 tabs every 6 hours. To help her gain weight I encouraged her to use carnation breakfast throughout the day and drink more fluids.    3.Body pain, secondary to bone mets, Hypocalcemia, leg cramps  -Since her cancer diagnosis,she has had b/l hip, shoulder and chest painat different time points, likely related to her bone mets -She declined Gabapentin given risk of suicidal thoughts. -She is being treated with Zometa q3months. 53months have hypocalcemia with leg cramps secondary to Zometa.  -She will continue oral calcium and Vit Dand Prilosec for Acid Reflux. -Current pain includes, lower back, left upper legand recently her right leg and shoulder.  She has increased use to 5-68m 1-2 times a day.Her PET from 03/21/19 does indicate disease progression in her b/l pelvic lesions. -Since recent fall and soreness she increased oxycodone 577mto 1-2 tabs TID.I refilled 04/07/19  4.Congenital deafness  5.Mild anemia, secondary to chemotherapy and malignancy -She will start with oral iron daily for 1-2  months and then stop. She will also start daily women's vitamin. -Stable   6.Hypokalemia, Hypomagnesium, secondary to Diarrhea -She developed diarrhea s/p cycle 3 Perjeta.Has been d/c. Now diarrhea due to Nerlynx.  -She is on Oral potassium 1010m-3 times a day. If she cannot take 3 tabs and she stopped oral Mag because it made her nauseous.  -Her K at 2.7 today (05/04/29). I recommend she take 5 tablets today and then return to TID. I also recommend IV Mag and IV potassium today, she is agreeable depending on time.    7.Goal of care discussion -she understand her goal of care is palliative to prolong her life -she is full code for now  8.Insomnia -She cannot take Benadryl due to allergy reaction. She has tried trazodone with no relief.  -She is on Xanax and Ambien 1-2 tabs nightly.I advised her to not take them at the same time.   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 She declined using ice bags using infusion -stable. Will watch on chemo.  10. Neutropenia, secondary to chemo -Seen after C4D1 with ANC 0.4 on 03/07/19 -She has been on Onpro on day 8 which has not been enough. I recommend Granix injections the day after chemo, she is apprehensive due to needles and frequent visits.  -Due to mixed response,Eribulinwas d/c.  11. Mildhyperbilirubinemia -likely second to chemotherapy, fluctuates  -Will monitor closely   12. Diffuse skin rash  -She notes after using 3 drops of CBD oil on her skin she has diffuse skin rash a few hours later. This was taken and started last night.  -She used cortisone cream on skin, she can continue. I will call in Claritin for her to use daily until rash is resolved. She completed tapering dose prednisone (after starting 04/14/19 over 4 days).    PLAN: -I refilled #40 tabs Nerlynx today. Will hold today until diarrhea resolves then restart at 87m87mily. If she is not able to tolerated 87mg62mly then we will stop it   -Labs  reviewed and adequate to proceed with Herceptin today  -IV Potassium and Mag today  -Lab, flush, F/u and Herceptin in 3 weeks, I can see her sooner if she is not able to tolerate Nerlynx 87mg 59my    No problem-specific Assessment & Plan notes found for this encounter.   No orders of the defined types were placed in this encounter.  All questions were answered. The patient knows to call the clinic with any problems, questions or concerns. No barriers to learning was detected. The total time spent in the appointment was 30 minutes.     Selmer Adduci FeTruitt Merle/23/2021   I, Amoya Joslyn Devoncting as scribe for Lanna Labella FeTruitt Merle  I have reviewed the above documentation for accuracy and completeness, and I agree with the above.

## 2019-05-05 ENCOUNTER — Inpatient Hospital Stay: Payer: Medicaid Other

## 2019-05-05 ENCOUNTER — Telehealth: Payer: Self-pay | Admitting: Hematology

## 2019-05-05 ENCOUNTER — Telehealth: Payer: Self-pay | Admitting: *Deleted

## 2019-05-05 ENCOUNTER — Other Ambulatory Visit: Payer: Self-pay

## 2019-05-05 ENCOUNTER — Encounter: Payer: Self-pay | Admitting: Hematology

## 2019-05-05 ENCOUNTER — Inpatient Hospital Stay (HOSPITAL_BASED_OUTPATIENT_CLINIC_OR_DEPARTMENT_OTHER): Payer: Medicaid Other | Admitting: Hematology

## 2019-05-05 VITALS — BP 122/76 | HR 65 | Temp 97.8°F | Resp 17 | Ht 65.0 in | Wt 132.9 lb

## 2019-05-05 DIAGNOSIS — C50919 Malignant neoplasm of unspecified site of unspecified female breast: Secondary | ICD-10-CM

## 2019-05-05 DIAGNOSIS — C7951 Secondary malignant neoplasm of bone: Secondary | ICD-10-CM

## 2019-05-05 DIAGNOSIS — C50911 Malignant neoplasm of unspecified site of right female breast: Secondary | ICD-10-CM | POA: Diagnosis not present

## 2019-05-05 DIAGNOSIS — E876 Hypokalemia: Secondary | ICD-10-CM

## 2019-05-05 DIAGNOSIS — Z5112 Encounter for antineoplastic immunotherapy: Secondary | ICD-10-CM | POA: Diagnosis not present

## 2019-05-05 DIAGNOSIS — Z95828 Presence of other vascular implants and grafts: Secondary | ICD-10-CM

## 2019-05-05 LAB — CMP (CANCER CENTER ONLY)
ALT: 15 U/L (ref 0–44)
AST: 14 U/L — ABNORMAL LOW (ref 15–41)
Albumin: 3.4 g/dL — ABNORMAL LOW (ref 3.5–5.0)
Alkaline Phosphatase: 59 U/L (ref 38–126)
Anion gap: 10 (ref 5–15)
BUN: 6 mg/dL (ref 6–20)
CO2: 28 mmol/L (ref 22–32)
Calcium: 8.6 mg/dL — ABNORMAL LOW (ref 8.9–10.3)
Chloride: 107 mmol/L (ref 98–111)
Creatinine: 0.83 mg/dL (ref 0.44–1.00)
GFR, Est AFR Am: >60 mL/min (ref >60)
GFR, Estimated: >60 mL/min (ref >60)
Glucose, Bld: 88 mg/dL (ref 70–99)
Potassium: 2.7 mmol/L — CL (ref 3.5–5.1)
Sodium: 145 mmol/L (ref 135–145)
Total Bilirubin: 1.8 mg/dL — ABNORMAL HIGH (ref 0.3–1.2)
Total Protein: 6 g/dL — ABNORMAL LOW (ref 6.5–8.1)

## 2019-05-05 LAB — CBC WITH DIFFERENTIAL (CANCER CENTER ONLY)
Abs Immature Granulocytes: 0.01 10*3/uL (ref 0.00–0.07)
Basophils Absolute: 0 10*3/uL (ref 0.0–0.1)
Basophils Relative: 0 %
Eosinophils Absolute: 0.2 10*3/uL (ref 0.0–0.5)
Eosinophils Relative: 5 %
HCT: 31.7 % — ABNORMAL LOW (ref 36.0–46.0)
Hemoglobin: 10.8 g/dL — ABNORMAL LOW (ref 12.0–15.0)
Immature Granulocytes: 0 %
Lymphocytes Relative: 23 %
Lymphs Abs: 0.8 10*3/uL (ref 0.7–4.0)
MCH: 29.5 pg (ref 26.0–34.0)
MCHC: 34.1 g/dL (ref 30.0–36.0)
MCV: 86.6 fL (ref 80.0–100.0)
Monocytes Absolute: 0.3 10*3/uL (ref 0.1–1.0)
Monocytes Relative: 8 %
Neutro Abs: 2.2 10*3/uL (ref 1.7–7.7)
Neutrophils Relative %: 64 %
Platelet Count: 179 10*3/uL (ref 150–400)
RBC: 3.66 MIL/uL — ABNORMAL LOW (ref 3.87–5.11)
RDW: 14.1 % (ref 11.5–15.5)
WBC Count: 3.6 10*3/uL — ABNORMAL LOW (ref 4.0–10.5)
nRBC: 0 % (ref 0.0–0.2)

## 2019-05-05 LAB — MAGNESIUM: Magnesium: 1.5 mg/dL — ABNORMAL LOW (ref 1.7–2.4)

## 2019-05-05 MED ORDER — SODIUM CHLORIDE 0.9 % IV SOLN
Freq: Once | INTRAVENOUS | Status: AC
  Filled 2019-05-05: qty 250

## 2019-05-05 MED ORDER — PALONOSETRON HCL INJECTION 0.25 MG/5ML
0.2500 mg | Freq: Once | INTRAVENOUS | Status: AC
  Administered 2019-05-05: 10:00:00 0.25 mg via INTRAVENOUS

## 2019-05-05 MED ORDER — SODIUM CHLORIDE 0.9% FLUSH
10.0000 mL | Freq: Once | INTRAVENOUS | Status: DC
  Filled 2019-05-05: qty 10

## 2019-05-05 MED ORDER — ACETAMINOPHEN 325 MG PO TABS
ORAL_TABLET | ORAL | Status: AC
  Filled 2019-05-05: qty 2

## 2019-05-05 MED ORDER — ALTEPLASE 2 MG IJ SOLR
2.0000 mg | Freq: Once | INTRAMUSCULAR | Status: DC | PRN
  Filled 2019-05-05: qty 2

## 2019-05-05 MED ORDER — ACETAMINOPHEN 325 MG PO TABS
650.0000 mg | ORAL_TABLET | Freq: Once | ORAL | Status: AC
  Administered 2019-05-05: 10:00:00 650 mg via ORAL

## 2019-05-05 MED ORDER — DEXAMETHASONE SODIUM PHOSPHATE 10 MG/ML IJ SOLN
5.0000 mg | Freq: Once | INTRAMUSCULAR | Status: AC
  Administered 2019-05-05: 10:00:00 5 mg via INTRAVENOUS

## 2019-05-05 MED ORDER — LORATADINE 10 MG PO TABS
10.0000 mg | ORAL_TABLET | Freq: Every day | ORAL | Status: DC
  Administered 2019-05-05: 10:00:00 10 mg via ORAL

## 2019-05-05 MED ORDER — TRASTUZUMAB-DKST CHEMO 150 MG IV SOLR
6.0000 mg/kg | Freq: Once | INTRAVENOUS | Status: AC
  Administered 2019-05-05: 378 mg via INTRAVENOUS
  Filled 2019-05-05: qty 18

## 2019-05-05 MED ORDER — DEXAMETHASONE SODIUM PHOSPHATE 10 MG/ML IJ SOLN
INTRAMUSCULAR | Status: AC
  Filled 2019-05-05: qty 1

## 2019-05-05 MED ORDER — SODIUM CHLORIDE 0.9 % IV SOLN: 1.0000 mg/m2 | Freq: Once | INTRAVENOUS | Status: DC

## 2019-05-05 MED ORDER — SODIUM CHLORIDE 0.9 % IV SOLN
Freq: Once | INTRAVENOUS | Status: AC
  Filled 2019-05-05: qty 500

## 2019-05-05 MED ORDER — NERATINIB MALEATE 40 MG PO TABS: 80.0000 mg | ORAL_TABLET | Freq: Every day | ORAL | 0 refills | Status: DC

## 2019-05-05 MED ORDER — SODIUM CHLORIDE 0.9% FLUSH
10.0000 mL | INTRAVENOUS | Status: DC | PRN
  Administered 2019-05-05: 10 mL
  Filled 2019-05-05: qty 10

## 2019-05-05 MED ORDER — LORATADINE 10 MG PO TABS
ORAL_TABLET | ORAL | Status: AC
  Filled 2019-05-05: qty 1

## 2019-05-05 MED ORDER — PALONOSETRON HCL INJECTION 0.25 MG/5ML
INTRAVENOUS | Status: AC
  Filled 2019-05-05: qty 5

## 2019-05-05 MED ORDER — SODIUM CHLORIDE 0.9 % IJ SOLN: 10.0000 mL | INTRAMUSCULAR | Status: DC | PRN

## 2019-05-05 MED ORDER — HEPARIN SOD (PORK) LOCK FLUSH 100 UNIT/ML IV SOLN
500.0000 [IU] | Freq: Once | INTRAVENOUS | Status: AC | PRN
  Administered 2019-05-05: 500 [IU]
  Filled 2019-05-05: qty 5

## 2019-05-05 NOTE — Progress Notes (Signed)
Pt is on Nerlynx; MD stopping [place].  Careplan orders updated. Pt somewhat nauseated today per inf RN so we'll keep her antiemetics going today.  Kennith Center, Pharm.D., CPP 05/05/2019@9 :55 AM

## 2019-05-05 NOTE — Telephone Encounter (Signed)
   Prior Authorization request received started by # [id number] [facility] for Budesonide ER 9 mg.  Collaborated with providers nurse.  Message left for [facility] that office will not submit further request for prior authorization.  Previous [facility] denial based on no trial and failure criteria of preferred medications of  "Ulcerative Colitis" drug class list.  Category lists Apriso, Colazal and Lialda.  Lastly, Sulfasalazine DR or IR also preferred yet contraindicated due to identified interaction.

## 2019-05-05 NOTE — Progress Notes (Signed)
Patient to receive Potassium chloride 10 meq and Magnesium Sulfate 2 gm in NS 522mL over 1 hour per Dr. Burr Medico. Orders entered into treatment plan as requested.  Hardie Pulley, PharmD, BCPS, BCOP

## 2019-05-05 NOTE — Telephone Encounter (Signed)
Scheduled appt per 4/23 los - gave patient AVS and calender - msg YF for f/u appt - no openings

## 2019-05-05 NOTE — Patient Instructions (Addendum)
[facility] °Discharge Instructions for Patients Receiving Chemotherapy ° °Today you received the following chemotherapy agents Trastuzumab ° °To help prevent nausea and vomiting after your treatment, we encourage you to take your nausea medication as directed. °  °If you develop nausea and vomiting that is not controlled by your nausea medication, call the clinic.  ° °BELOW ARE SYMPTOMS THAT SHOULD BE REPORTED IMMEDIATELY: °· *FEVER GREATER THAN 100.5 F °· *CHILLS WITH OR WITHOUT FEVER °· NAUSEA AND VOMITING THAT IS NOT CONTROLLED WITH YOUR NAUSEA MEDICATION °· *UNUSUAL SHORTNESS OF BREATH °· *UNUSUAL BRUISING OR BLEEDING °· TENDERNESS IN MOUTH AND THROAT WITH OR WITHOUT PRESENCE OF ULCERS °· *URINARY PROBLEMS °· *BOWEL PROBLEMS °· UNUSUAL RASH °Items with * indicate a potential emergency and should be followed up as soon as possible. ° °Feel free to call the clinic should you have any questions or concerns. The clinic phone number is [phone]. ° °Please show the CHEMO ALERT CARD at check-in to the Emergency Department and triage nurse. ° ° °

## 2019-05-06 LAB — CANCER ANTIGEN 27.29: CA 27.29: 25.6 U/mL (ref 0.0–38.6)

## 2019-05-10 ENCOUNTER — Encounter: Payer: Self-pay | Admitting: Hematology

## 2019-05-11 ENCOUNTER — Other Ambulatory Visit: Payer: Self-pay | Admitting: Hematology

## 2019-05-11 ENCOUNTER — Telehealth: Payer: Self-pay

## 2019-05-11 MED ORDER — ZOLPIDEM TARTRATE 5 MG PO TABS: 5.0000 mg | ORAL_TABLET | Freq: Every evening | ORAL | 0 refills | Status: DC | PRN

## 2019-05-11 NOTE — Telephone Encounter (Signed)
Ms Laidig called requesting refill for ambien.

## 2019-05-18 ENCOUNTER — Encounter: Payer: Self-pay | Admitting: Hematology

## 2019-05-18 ENCOUNTER — Other Ambulatory Visit: Payer: Self-pay | Admitting: Hematology

## 2019-05-18 ENCOUNTER — Telehealth: Payer: Self-pay

## 2019-05-18 MED ORDER — OXYCODONE HCL 5 MG PO TABS: 5.0000 mg | ORAL_TABLET | Freq: Three times a day (TID) | ORAL | 0 refills | Status: DC | PRN

## 2019-05-18 MED ORDER — NERATINIB MALEATE 40 MG PO TABS: 80.0000 mg | ORAL_TABLET | Freq: Every day | ORAL | 0 refills | Status: DC

## 2019-05-18 NOTE — Telephone Encounter (Signed)
[name] called requesting refills for oxycodone and Nerlynx.

## 2019-05-22 NOTE — Progress Notes (Signed)
Pharmacist Chemotherapy Monitoring - Follow Up Assessment    I verify that I have reviewed each item in the below checklist:  . Regimen for the patient is scheduled for the appropriate day and plan matches scheduled date. Marland Kitchen Appropriate non-routine labs are ordered dependent on drug ordered. . If applicable, additional medications reviewed and ordered per protocol based on lifetime cumulative doses and/or treatment regimen.   Plan for follow-up and/or issues identified: No . I-vent associated with next due treatment: No . MD and/or nursing notified: No  [name] [facility] 05/22/2019 11:47 AM

## 2019-05-24 ENCOUNTER — Encounter: Payer: Self-pay | Admitting: Hematology

## 2019-05-24 NOTE — Progress Notes (Signed)
[name]   [phone] [phone]   Clinic Follow up Note   Patient Care Team: [name], [name], PA-[name] as PCP - General (Physician Assistant) [name], MD as Consulting Physician (Orthopedic Surgery)  Date of Service:  05/26/2019  CHIEF COMPLAINT: F/u ofmetastatic breast cancer  SUMMARY OF ONCOLOGIC HISTORY: Oncology History Overview Note   Cancer Staging No matching staging information was found for the patient.     [name], right ([place])  11/1997 Cancer Diagnosis   Patient had 1.4 cm poorly differentiated right breast carcinoma diagnosed in Nov 1999 at age 54, 1/7 axillary nodes involved, ER/PR and HER 2 positive. She had mastectomy with the 7 axillary node evaluation, 4 cycles of adriamycin/ cytoxan followed by taxotere, then five years of tamoxifen thru June 2005 (no herceptin in 1999). She had bilateral oophorectomy in May 2005. She was briefly on aromatase inhibitor after tamoxifen, but discontinued this herself due to poor tolerance.   04/04/2015 Genetic Testing   Negative genetic testing on the breast/ovarian cancer pnael.  The Breast/Ovarian gene panel offered by [facility] includes sequencing and rearrangement analysis for the following 20 genes:  ATM, BARD1, BRCA1, BRCA2, BRIP1, CDH1, CHEK2, EPCAM, FANCC, MLH1, MSH2, MSH6, NBN, PALB2, PMS2, PTEN, RAD51C, RAD51D, TP53, and XRCC2.   06/26/2015 Pathology Results   Initial Path Report Bone, curettage, Left femur met, intramedullary subtroch tissue - METASTATIC ADENOCARCINOMA.  Estrogen Receptor: 95%, POSITIVE, STRONG STAINING INTENSITY Progesterone Receptor: 2%, POSITIVE, STRONG  HER2 (+), IHC 3+   06/26/2015 Surgery   Biopsy of metastatic tissue and stabilization with Affixus trochanteric nail, proximal and distal interlock of left femur by Dr. [name]    06/28/2015 Imaging   CT Chest w/ Contrast 1. Extensive right internal mammary chain  lymphadenopathy consistent with metastatic breast cancer. 2. Lytic metastases involving the posterior elements at T1 with probable nondisplaced pathologic fracture of the spinous process. 3. No other evidence of thoracic metastatic disease. 4. Trace bilateral pleural effusions with associated bibasilar atelectasis. 5. Indeterminate right thyroid nodule, not previously imaged. This could be evaluated with thyroid ultrasound as clinically warranted.   06/29/2015 -  Chemotherapy   Zometa started monthly on 06/29/15 and switched to q44month from 01/08/17   07/01/2015 Imaging   Bone Scan 1. Increased activity noted throughout the left femur. Although metastatic disease cannot be excluded, these changes are most likely secondary to prior surgery. 2. No other focal abnormalities identified to suggest metastatic disease.   07/12/2015 - 07/26/2015 Radiation Therapy   Left femur, 30 Gy in 10 fractions by Dr. [name]   07/14/2015 Initial Diagnosis   [name], right [place]   07/19/2015 Imaging   Initial PET Scan 1. Severe multifocal osseous metastatic disease, much greater than anticipated based on prior imaging. 2. Multifocal nodal metastases involving the right internal mammary, prevascular and subpectoral lymph nodes. No axillary pulmonary involvement identified. 3. No distant extra osseous metastases.   07/30/2015 - 11/29/2015 Chemotherapy   Docetaxel, Herceptin and Perjeta every 3 weeks X6 cycles, pt tolerated moderately well, restaging scan showed excellent response    09/18/2015 Imaging   CT Head w/wo Contrast 1. No acute intracranial abnormality or significant interval change. 2. Stable minimal periventricular white matter hypoattenuation on the right. This may reflect a remote ischemic injury. 3. No evidence for metastatic disease to the brain. 4. No focal soft tissue lesion to explain the patient's tenderness.   10/30/2015 Imaging   CT  Abdomen Pelvis w/  Contrast 1. Few mildly prominent fluid-filled loops of small bowel scattered throughout the abdomen, with intraluminal fluid density within the distal colon. Given the provided history, findings are suggestive of acute enteritis/diarrheal illness. 2. No other acute intra-abdominal or pelvic process identified. 3. Widespread osseous metastatic disease, better evaluated on most recent PET-CT from 07/19/2015. No pathologic fracture or other complication. 4. 11 mm hypodensity within the left kidney. This lesion measures intermediate density, and is indeterminate. While this lesion is similar in size relative to recent studies, this is increased in size relative to prior study from 2012. Further evaluation with dedicated renal mass protocol CT and/or MRI is recommended for complete characterization.   12/2015 - 05/14/2016 Chemotherapy   Maintenance Herceptin and pejeta every 3 weeks stopped on 05/14/16 due to disease progression   12/25/2015 Imaging   Restaging PET Scan 1. Markedly improved skeletal activity, with only several faint foci of residual accentuated metabolic activity, but resolution of the vast majority of the previously extensive osseous metastatic disease. 2. Resolution of the prior right internal mammary and right prevascular lymph nodes. 3. Residual subcutaneous edema and edema along fascia planes in the left upper thigh. This remains somewhat more than I would expect for placement of an IM nail 6 weeks ago. If there is leg swelling further down, consider Doppler venous ultrasound to rule out left lower extremity DVT. I do not perceive an obvious difference in density between the pelvic and common femoral veins on the noncontrast CT data. 4. Chronic right maxillary and right sphenoid sinusitis.   01/16/2016 - 10/20/2017 Anti-estrogen oral therapy   Letrozole 2.5 mg daily. She was switched to exemestane due to disease progression on 02/08/17. Stopped on 10/20/2017 when treatment changed to chemo     03/01/2016 Miscellaneous   Patient presented to ED following a fall; she reports she landed on her back and is now experiencing back pain. The patient was evaluated and discharge home same day with pain control.   05/01/2016 Imaging   CT CAP IMPRESSION: 1. Stable exam.  No new or progressive disease identified. 2. No evidence for residual or recurrent adenopathy within the chest. 3. Stable bone metastasis.   05/01/2016 Imaging   BONE SCAN IMPRESSION: 1. Small focal uptake involving the anterior rib ends of the left second and right fourth ribs, new since the prior bone scan. The location of this uptake is more suggestive of a traumatic or inflammatory etiology as opposed to metastatic disease. No other evidence of metastatic disease. 2. There are other areas of stable uptake which are most likely degenerative/reactive, including right shoulder and cervical spine uptake and uptake along the right femur adjacent to the ORIF hardware.   05/20/2016 Imaging   NM PET Skull to Thigh IMPRESSION: 1. There multiple metastatic foci in the bony pelvis, including some new abnormal foci compare to the prior PET-CT, compatible with mildly progressive bony metastatic disease. Also, a left T11 vertebral hypermetabolic metastatic lesion is observed, new compared to the prior exam. 2. No active extraosseous malignancy is currently identified. 3. Right anterior fourth rib healing fracture, appears be   06/04/2016 - 01/08/2017 Chemotherapy   Second line chemotherapy Kadcyla every 3 weeks started on 06/04/16 and stopped on 01/08/17 due to mixed response.     10/05/2016 Imaging   CT CAP and Bone Scan:  Bones: left intramedullary rod and left femoral head neck screw in place. In the proximal diaphysis of the left femur there is cortical thickening and irregularity.  No lytic or blastic bone lesions. No fracture or vertebral endplate destruction.   No definite evidence of recurrent disease in the  chest, abdomen, or pelvis.   10/28/2016 Imaging   CT A/P IMPRESSION: No acute process demonstrated in the abdomen or pelvis. No evidence of bowel obstruction or inflammation.   11/04/2016 Imaging   DG foot complete left: IMPRESSION: No fracture or dislocation.  No soft tissue abnormality   11/23/2016 Imaging   CT RIGHT FEMUR IMPRESSION: Negative CT scan of the right thigh.  No visible metastatic disease.  CT LEFT FEMUR IMPRESSION: 1. Postsurgical changes related to prior cephalomedullary rod fixation of the left femur with linear lucency along the anterolateral proximal femoral cortex at level of the lesser trochanter, suspicious for nondisplaced fracture. 2. Slightly permeative appearance of the proximal left femoral cortex at the site of known prior osseous metastases is largely unchanged. 3. Unchanged patchy lucency and sclerosis along the anterior acetabulum, which appears to correspond with an area of faint uptake on the prior PET-CT, suspicious for osseous metastasis. These results will be called to the ordering clinician or representative by the Radiologist Assistant, and communication documented in the PACS or zVision Dashboard.     01/27/2017 Imaging   IMPRESSION: 1. Mixed response to osseous metastasis. Some hypermetabolic lesions are new and increasingly hypermetabolic, while another index lesion is decreasingly hypermetabolic. 2. No evidence of hypermetabolic soft tissue metastasis.    02/08/2017 - 10/19/2017 Antibody Plan   -Herceptin every 3 week since 02/08/17 -Oral Lanpantinib 1511m and decreased to 5076mdue to diarrhea and depression. Due to disease progression she swithced to Verzenio 15027mn 05/04/17. She did not toelrate well so she was switched to Ibrance 100m57m 05/31/17. Due to her neutropenia, Ibrance dose reduced to 75 mg daily, 3 weeks on, one-week off, stopped on 10/20/2017 due to disease progression.    04/30/2017 PET scan   IMPRESSION: 1.  Primarily increase in hypermetabolism of osseous metastasis. An isolated right acetabular lesion is decreased in hypermetabolism since the prior exam. 2. No evidence of hypermetabolic soft tissue metastasis. 3. Similar non FDG avid right-sided thyroid nodule, favoring a benign etiology. Recommend attention on follow-up.   08/30/2017 PET scan   08/30/2017 PET Scan  IMPRESSION: 1. Worsening osseous metastatic disease. 2. Focal hypermetabolism in the central spinal canal the L1 level, similar to the prior exam. Metastatic implant cannot be excluded. 3. Slight enlargement of a right thyroid nodule which now appears more cystic in character. Consider further evaluation with thyroid ultrasound. If patient is clinically hyperthyroid, consider nuclear medicine thyroid uptake and scan   08/30/2017 Progression   08/30/2017 PET Scan  IMPRESSION: 1. Worsening osseous metastatic disease. 2. Focal hypermetabolism in the central spinal canal the L1 level, similar to the prior exam. Metastatic implant cannot be excluded. 3. Slight enlargement of a right thyroid nodule which now appears more cystic in character. Consider further evaluation with thyroid ultrasound. If patient is clinically hyperthyroid, consider nuclear medicine thyroid uptake and scan   09/29/2017 - 10/11/2017 Radiation Therapy   Pt received 27 Gy in 9 fractions directed at the areas of significant uptake noted on recent bone scan the right pelvis and right proximal femur, with Dr, KinaSondra Come10/09/2017 - 04/07/2018 Chemotherapy   -Herceptin every 3 weeks starting 02/08/17, perjeta added on 10/20/2017  -weekly Taxol started on 10/21/2017. Will reduce Taxol to 2 weeks on, 1 week off starting on 02/04/17.  Stoped due to mild disease progression   10/27/2017  Imaging   10/27/2017 CT AP IMPRESSION: 1. No acute abnormality. No evidence of bowel obstruction or acute bowel inflammation. Normal appendix. 2. Stable patchy sclerotic osseous  metastases throughout the visualized skeleton. No new or progressive metastatic disease in the abdomen or pelvis.   01/11/2018 Imaging   Whole Body Bone Scan 01/11/18  IMPRESSION: 1. Multifocal osseous metastases as described. 2. Right scapular metastasis. 3. Right superior thoracic spinous process. 4. Right sacral ala and right L5 metastases. 5. Right acetabular metastasis. 6. Right proximal femur metastasis. 7. Diffuse metastases throughout the left femur. 8. Anterior right fourth rib metastasis.   01/11/2018 Imaging   CT CAP W Contrast 01/11/18  IMPRESSION: 1. Similar-appearing osseous metastatic disease. 2. No evidence for progressed metastatic disease in the chest, abdomen or pelvis.   03/30/2018 Imaging   Bone Scan 03/30/18  IMPRESSION: Stable multifocal bony metastatic disease.   03/30/2018 Imaging    CT CAP W Contrast 03/30/18 IMPRESSION: 1. New mild contiguous wall thickening with associated mucosal hyperenhancement throughout the left colon and rectum, suggesting nonspecific infectious or inflammatory proctocolitis. Differential includes [name] diff colitis. 2. Stable faint patchy sclerosis in the iliac bones and lumbar vertebral bodies. No new or progressive osseous metastatic disease by CT. Please note that the osseous lesions are relatively occult by CT and would be better evaluated by PET-CT, as clinically warranted. 3. No evidence of extra osseous metastatic disease.  These results will be called to the ordering clinician or representative by the Radiologist Assistant, and communication documented in the PACS or zVision Dashboard.   04/15/2018 - 06/16/2018 Chemotherapy   Enhertu q3weeks starting 04/15/18. Stopped Enhertu after cycle 3 on 05/27/18 due to poor toleration due to poor toleration with anorexia, weight loss, N&V, diarrhea   04/21/2018 PET scan   PET 04/21/18  IMPRESSION: 1. Overall marked interval improvement in bony hypermetabolic disease. All of the  previously identified hypermetabolic osseous metastases have decreased in hypermetabolism in the interval. There is a new tiny focus of hypermetabolism in the right aspect of the L3 vertebral body that appears to correspond to a new 8 mm lucency, raising concern for new metastatic disease. Close attention on follow-up recommended. 2. Focal hypermetabolism in the central spinal canal at the level of L1 previously has resolved. 3. No hypermetabolic soft tissue disease on today's study. 4. Right thyroid nodule seen previously has decreased in the interval.    06/16/2018 Imaging   CT LUMBAR SPINE WO CONTRAST 06/16/18 IMPRESSION: 1. Transitional lumbosacral anatomy. Correlation with radiographs is recommended prior to any operative intervention. 2. Suspicion of a right L3 vertebral body metastasis on the April PET-CT is not evident by CT. Stable degenerative versus metastatic lesion at the right L5 superior endplate. Small but increased right iliac bone metastasis since March. Lumbar MRI would best characterize lumbar metastatic disease (without contrast if necessary). 3. Suspect L2-L3 left subarticular segment disc herniation. Query left L2 radiculitis. 4. Stable appearance of rightward L4-L5 disc degeneration which could affect the right foramen and right lateral recess.   06/17/2018 - 11/16/2018 Chemotherapy   Restart Taxol 2 weeks on/1 week off with Herceptin and Perjeta q3weeks on 06/17/18. Perjeta was stopped after C5 due to diarrhea. Stopped Taxol after 11/16/18 due to disease progression to L3.    07/06/2018 PET scan   PET  IMPRESSION: 1. Near total resolution of hypermetabolic activity associated with skeletal metastasis. Very mild activity associated with the LEFT iliac bone which is similar to background blood pool activity. 2. No new or  progressive disease.   11/21/2018 PET scan   IMPRESSION: 1. New focal hypermetabolic lesion eccentric to the right in the L3 vertebral body,  maximum SUV 6.1 compatible with a new focus of active malignancy. 2. Very low-grade activity similar to blood pool in previous existing mildly sclerotic metastatic lesions including the left iliac bone lesion. Some of the mildly sclerotic lesions are not hypermetabolic compatible with effectively treated bony metastatic disease. 3. No hypermetabolic soft tissue lesions are identified.     11/30/2018 -  Chemotherapy   Continue Herceptin q3weeks    12/12/2018 - 12/17/2018 Chemotherapy   Tucatinib (TUKYSA) 6 tabs BID as a next line therapy starting 12/12/18. Stopped 12/17/18 duet o N&V, poor appetite and HA    12/21/2018 - 03/17/2019 Chemotherapy   IV Eribulin on day 1, 8 every 21 days starting 12/21/18. Stopped 03/17/19 due to mixed response to treatment.    03/21/2019 PET scan   IMPRESSION: 1. Mixed response to therapy of osseous metastasis. Although a right-sided L4 lesion demonstrates decreased hypermetabolism today, new left iliac and right ischial hypermetabolic lesions are seen. 2. No evidence of hypermetabolic soft tissue metastasis.     03/27/2019 -  Chemotherapy   Neratinib (Nerlynx) titrate up from 125m to 2422mstarting 03/27/19. She could not tolerate 16021mnd stopped on 02/05/19 due to N&V and diarrhea. Will restart at 120m98m 04/10/18-04/27/19 and 4/19-4/22. Reduced to 80mg71me daily on 05/10/19      CURRENT THERAPY:  -Zometa q3mont25monthtinue Herceptin q3weeks -Neratinib (Nerlynx)titrate up from 120mg t33m0mg st68mng3/15/21.She could not tolerate160mgand 19mped on 02/05/19 due to N&V and diarrhea. Will restart at 120mg on 315m20-04/27/19 and 4/19-4/22. Reduced to 80mg once 70my on 05/10/19  INTERVAL HISTORY:  [name] EthDARNISHA VERNETr a follow up. She presents to the clinic with her Interpretor. She presents to the clinic alone. She notes she is tired and her bones hurts with pain 6-7/10 occasionally in her right arm. She is able to function some. She is on  Nerlynx 80mg with l35me diarrhea, manageable. Her appetite is fair.  She is on Oxycodone 5mg once dai10m    REVIEW OF SYSTEMS:   Constitutional: Denies fevers, chills or abnormal weight loss Eyes: Denies blurriness of vision Ears, nose, mouth, throat, and face: Denies mucositis or sore throat Respiratory: Denies cough, dyspnea or wheezes Cardiovascular: Denies palpitation, chest discomfort or lower extremity swelling Gastrointestinal:  Denies nausea, heartburn or change in bowel habits (+) Manageable diarrhea  Skin: Denies abnormal skin rashes MSK: (+) Bone pain in right arm.  Lymphatics: Denies new lymphadenopathy or easy bruising Neurological:Denies numbness, tingling or new weaknesses Behavioral/Psych: Mood is stable, no new changes  All other systems were reviewed with the patient and are negative.  MEDICAL HISTORY:  Past Medical History:  Diagnosis Date   Abnormal Pap smear    Anxiety    Breast cancer ([facility])    [facility]   CIN I (cervical intraepithelial neoplasia I) 2003   by colpo   Congenital deafness    Deaf    Depression    Port-A-Cath in place 2017   for chemotherapy   Radiation 07/12/15-07/26/15   left femur 30 Gy   S/P bilateral oophorectomy     SURGICAL HISTORY: Past Surgical History:  Procedure Laterality Date   ABDOMINAL HYSTERECTOMY     INTRAMEDULLARY (IM) NAIL INTERTROCHANTERIC Left 06/26/2015   Procedure: LEFT BIOMET LONG AFFIXS NAIL;  Surgeon: Mark [name] Yates,[name]  Location: [facility];  Service: Orthopedics;  Laterality: Left;   IR GENERIC HISTORICAL  08/06/2015   IR US GUIDE VASC ACCESS LEFT 08/06/2015 Aletta Edouard, MD [facility]   IR GENERIC HISTORICAL  08/06/2015   IR FLUORO GUIDE CV LINE LEFT 08/06/2015 Aletta Edouard, MD [facility]   IR GENERIC HISTORICAL  03/05/2016   IR CV LINE INJECTION 03/05/2016 Markus Daft, MD [facility]   LEFT HEART CATH AND CORONARY ANGIOGRAPHY N/A 12/13/2017    Procedure: LEFT HEART CATH AND CORONARY ANGIOGRAPHY;  Surgeon: Leonie Man, MD;  Location: [place] [facility];  Service: Cardiovascular;  Laterality: N/A;   MASTECTOMY     right breast   MASTECTOMY     OVARIAN CYST REMOVAL     RADIOLOGY WITH ANESTHESIA Left 06/25/2015   Procedure: MRI OF LEFT HIP WITH OR WITHOUT CONTRAST;  Surgeon: Medication Radiologist, MD;  Location: [facility];  Service: Radiology;  Laterality: Left;  DR. MCINTYRE/MRI   TUBAL LIGATION      I have reviewed the social history and family history with the patient and they are unchanged from previous note.  ALLERGIES:  is allergic to promethazine hcl; chlorhexidine; and diphenhydramine hcl.  MEDICATIONS:  Current Outpatient Medications  Medication Sig Dispense Refill   albuterol (PROAIR HFA) 108 (90 Base) MCG/ACT inhaler Inhale 2 puffs into the lungs every 6 (six) hours as needed for wheezing or shortness of breath.     albuterol (PROVENTIL HFA;VENTOLIN HFA) 108 (90 Base) MCG/ACT inhaler Inhale 2 puffs into the lungs every 6 (six) hours as needed for wheezing or shortness of breath. 1 Inhaler 0   ALPRAZolam (XANAX) 0.25 MG tablet Take 1 tablet (0.25 mg total) by mouth 2 (two) times daily as needed for anxiety. 30 tablet 0   calcium carbonate (TUMS - DOSED IN MG ELEMENTAL CALCIUM) 500 MG chewable tablet Chew 1 tablet by mouth as needed.     diphenoxylate-atropine (LOMOTIL) 2.5-0.025 MG tablet Take 1-2 tablets by mouth 4 (four) times daily as needed for diarrhea or loose stools. 60 tablet 1   gabapentin (NEURONTIN) 100 MG capsule Take 1 capsule (100 mg total) by mouth at bedtime. 60 capsule 0   lidocaine-prilocaine (EMLA) cream Apply 1 application topically as needed. Apply to Porta-Cath 1-2 hours prior to access as directed. 90 g 2   loratadine (CLARITIN) 10 MG tablet Take 1 tablet (10 mg total) by mouth daily. 30 tablet 0   magnesium oxide (MAG-OX) 400 (241.3 Mg) MG tablet Take 1 tablet (400 mg total) by mouth  daily. 30 tablet 3   methocarbamol (ROBAXIN) 500 MG tablet Take 1 tablet (500 mg total) by mouth 2 (two) times daily. (Patient taking differently: Take 500 mg by mouth 2 (two) times daily as needed for muscle spasms. ) 20 tablet 0   Neratinib Maleate (NERLYNX) 40 MG tablet Take 2 tablets (80 mg total) by mouth daily. Take with food. 40 tablet 0   ondansetron (ZOFRAN) 8 MG tablet Take 1 tablet (8 mg total) by mouth every 8 (eight) hours as needed for nausea or vomiting. 45 tablet 1   oxyCODONE (ROXICODONE) 5 MG immediate release tablet Take 1 tablet (5 mg total) by mouth every 8 (eight) hours as needed for severe pain. 60 tablet 0   potassium chloride (KLOR-CON) 10 MEQ tablet Take 3 tablets (30 mEq total) by mouth daily. 90 tablet 2   predniSONE (DELTASONE) 10 MG tablet Take 1 tablet (10 mg total) by mouth daily with breakfast. Take 4  tab for first day, then decrease 1 tab daily until she finishes 10 tablet 0   prochlorperazine (COMPAZINE) 10 MG tablet Take 1 tablet (10 mg total) by mouth 2 (two) times daily as needed for nausea or vomiting. 10 tablet 0   zolpidem (AMBIEN) 5 MG tablet Take 1-2 tablets (5-10 mg total) by mouth at bedtime as needed. 60 tablet 0   No current facility-administered medications for this visit.   Facility-Administered Medications Ordered in Other Visits  Medication Dose Route Frequency Provider Last Rate Last Admin   loratadine (CLARITIN) tablet 10 mg  10 mg Oral Daily Truitt Merle, MD   10 mg at 05/26/19 0911   potassium chloride SA (K-DUR) CR tablet 40 mEq  40 mEq Oral Once Truitt Merle, MD       sodium chloride 0.9 % 1,000 mL with potassium chloride 10 mEq infusion   Intravenous Continuous Livesay, Tamala Julian, MD   Stopped at 09/10/15 1653   sodium chloride 0.9 % injection 10 mL  10 mL Intravenous PRN Livesay, Lennis P, MD       sodium chloride flush (NS) 0.9 % injection 10 mL  10 mL Intracatheter PRN Truitt Merle, MD   10 mL at 05/26/19 1007    PHYSICAL  EXAMINATION: ECOG PERFORMANCE STATUS: 1 - Symptomatic but completely ambulatory Weight 133, blood pressure 96/74, heart rate is 73, respirate 16, temperature 36.7, pulse ox 99% on room air GENERAL:alert, no distress and comfortable SKIN: skin color, texture, turgor are normal, no rashes or significant lesions EYES: normal, Conjunctiva are pink and non-injected, sclera clear  NECK: supple, thyroid normal size, non-tender, without nodularity LYMPH:  no palpable lymphadenopathy in the cervical, axillary  LUNGS: clear to auscultation and percussion with normal breathing effort HEART: regular rate & rhythm and no murmurs and no lower extremity edema ABDOMEN:abdomen soft, non-tender and normal bowel sounds Musculoskeletal:no cyanosis of digits and no clubbing  NEURO: alert & oriented x 3 with fluent speech, no focal motor/sensory deficits  LABORATORY DATA:  I have reviewed the data as listed CBC Latest Ref Rng & Units 05/26/2019 05/05/2019 04/14/2019  WBC 4.0 - 10.5 K/uL 4.0 3.6(L) 6.4  Hemoglobin 12.0 - 15.0 g/dL 11.2(L) 10.8(L) 12.2  Hematocrit 36.0 - 46.0 % 33.2(L) 31.7(L) 36.3  Platelets 150 - 400 K/uL 178 179 203     CMP Latest Ref Rng & Units 05/26/2019 05/05/2019 04/14/2019  Glucose 70 - 99 mg/dL 74 88 86  BUN 6 - 20 mg/dL 8 6 13   Creatinine 0.44 - 1.00 mg/dL 0.78 0.83 0.95  Sodium 135 - 145 mmol/L 142 145 142  Potassium 3.5 - 5.1 mmol/L 3.6 2.7(LL) 3.1(L)  Chloride 98 - 111 mmol/L 107 107 109  CO2 22 - 32 mmol/L 27 28 22   Calcium 8.9 - 10.3 mg/dL 8.9 8.6(L) 9.3  Total Protein 6.5 - 8.1 g/dL 6.3(L) 6.0(L) 6.6  Total Bilirubin 0.3 - 1.2 mg/dL 1.8(H) 1.8(H) 1.7(H)  Alkaline Phos 38 - 126 U/L 58 59 71  AST 15 - 41 U/L 15 14(L) 19  ALT 0 - 44 U/L 14 15 16       RADIOGRAPHIC STUDIES: I have personally reviewed the radiological images as listed and agreed with the findings in the report. No results found.   ASSESSMENT & PLAN:  MAYSOON LOZADA is a 54 y.o. 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 She  had right mastectomy, 4 cycles of AC-T and 5 years of Tamoxifen/AI.  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 She has received Target RT. She had excellent response to First-line Docetaxel/Herceptin/Perjeta and AI but progressed on maintenance therapy. She had mixed response on second line Kadcyla, progressed on third-line Ibrance/Herceptin and poorly tolerated Enhertu.  -She triedTucatinib (TUKYSA)with low doseon 12/12/18 however due to N&V, poor appetite and HA she stopped on 12/17/18 after trying twice. -I started her on next lineivchemoEribulin on day 1, 8, every 21 days on 12/21/18, along with Herceptin every 3 weeks. -HerPET imagesfrom 03/21/19 showmixed response to therapy of bone metastasis(improvedL4 lesion, new pelvic bone lesions). No evidence of hypermetabolic soft tissue metastasis.  -I discussed her disease is starting to develop resistance to her chemo treatment. Istarted her next line treatmentwithNeratinib(Nerlynx) on 03/27/19 titration from 132m to 2447m Unfortunately she could not tolerate neratinib at dose of 16078mth significant nausea and diarrhea, and barely tolerated 120m92mse. She retried at 80mg51me daily Nerlynx starting on 05/08/19. She is tolerating it well now  -Due to high copay she was not able to getBudesonide. She did not take Colestid due to large size of pills. -We will continue Herceptin until thenewly approvedMargetuzimabbecomes available in our clinic, which I recommend.If she is able to tolerateNeratinib, I will add on tamoxifen. -She is now tolerating 80mg 71mynx well with manageable diarrhea and fair appetite. I reviewed her Ca 27.29 has dropped by half and in normal range now. She is clinically responding to treatment.  -Labs reviewed, and adequate to  proceed with Herceptin today.  -Continue Nerlynx 80mg o19mdaily.  -Plan for next PET in late June  -F/u in 3 weeks    2. N&V, Diarrhea -previouslysecondary to Eribulin, but now secondary to Neratinib  -She tolerated 120mg do8mell but 160mg she70mpped after 3 days due to N&V and liquid diarrhea hourly. -She notes Imodium did not help. She could not afford $1300 Budesonide. She has not taken the alternativeColestid becausethe pills are too large for her to swallow. -She initially had manageable side effects on 120mg dose48matinib but N/V/D recurred with stool incontinence so she retried at 80mg once 97my dose.  -I reviewed Lomotil use to control her diarrhea. She can use up to 2 tabs every 6 hours. To help her gain weight I encouraged her to use carnation breakfast throughout the day and drink more fluids.  -Her diarrhea has become manageable on 80mg once d27m Nerlynx, will continue management.    3.Body pain, secondary to bone mets, Hypocalcemia, leg cramps  -Since her cancer diagnosis,she has had b/l hip, shoulder and chest painat different time points, likely related to her bone mets -She declined Gabapentin given risk of suicidal thoughts. -She is being treated with Zometa q3months. She73monthave hypocalcemia with leg cramps secondary to Zometa.  -She will continue oral calcium and Vit Dand Prilosec for Acid Reflux. -Current pain includes, lower back, left upper legand recently her right leg and right shoulder. Her PET from 03/21/19 does indicate disease progression in her b/l pelvic lesions. -Pain is currently controlled on 5mg Oxycodone90mce daily.   4.Congenital deafness  5.Mild anemia, secondary to chemotherapy and malignancy -She will start with oral iron daily for 1-2 months and then stop. She will also start daily women's vitamin. -Stable   6.Hypokalemia, Hypomagnesium, secondary to Diarrhea -She developed diarrhea s/p cycle 3 Perjeta.Has been  d/[name]. Now diarrhea due to Nerlynx.  -She is on  Oral potassium 58mq3 times a day. She stopped oral Mag because it made her nauseous.   7.Goal of care discussion -she understand her goal of care is palliative to prolong her life -she is full code for now  8.Insomnia -She cannot take Benadryl due to allergy reaction. She has tried trazodone with no relief.  -She is on Xanax and Ambien 1-2 tabs nightly.I advised her to not take them at the same time.   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 She declined using ice bags using infusion -stable. Will watch on chemo.  10. Neutropenia, secondary to chemo -Seen after C4D1 with ANC 0.4 on 03/07/19 -She has been on Onpro on day 8 which has not been enough. I recommend Granix injections the day after chemo, she is apprehensive due to needles and frequent visits.  -Due to mixed response,Eribulinwas d/[name].  11. Mildhyperbilirubinemia -likely second to chemotherapy, fluctuates  -Will monitor closely   PLAN: -Labs reviewed and adequate to proceed with Herceptin today  -Continue 832mNerlynx daily  -Lab, flush, F/u and Herceptin in 3 weeks, will order restaging PET on next visit    No problem-specific Assessment & Plan notes found for this encounter.   No orders of the defined types were placed in this encounter.  All questions were answered. The patient knows to call the clinic with any problems, questions or concerns. No barriers to learning was detected. The total time spent in the appointment was 30 minutes.     YaTruitt MerleMD 05/26/2019   I, AmJoslyn Devonam acting as scribe for YaTruitt MerleMD.   I have reviewed the above documentation for accuracy and completeness, and I agree with the above.

## 2019-05-26 ENCOUNTER — Encounter: Payer: Self-pay | Admitting: Hematology

## 2019-05-26 ENCOUNTER — Inpatient Hospital Stay: Payer: Medicaid Other | Attending: Hematology

## 2019-05-26 ENCOUNTER — Inpatient Hospital Stay: Payer: Medicaid Other

## 2019-05-26 ENCOUNTER — Inpatient Hospital Stay (HOSPITAL_BASED_OUTPATIENT_CLINIC_OR_DEPARTMENT_OTHER): Payer: Medicaid Other | Admitting: Hematology

## 2019-05-26 ENCOUNTER — Other Ambulatory Visit: Payer: Self-pay

## 2019-05-26 VITALS — BP 96/74 | HR 73 | Temp 98.0°F | Resp 16 | Wt 133.8 lb

## 2019-05-26 DIAGNOSIS — C50911 Malignant neoplasm of unspecified site of right female breast: Secondary | ICD-10-CM | POA: Insufficient documentation

## 2019-05-26 DIAGNOSIS — Z170 Estrogen receptor positive status [ER+]: Secondary | ICD-10-CM | POA: Insufficient documentation

## 2019-05-26 DIAGNOSIS — D6481 Anemia due to antineoplastic chemotherapy: Secondary | ICD-10-CM | POA: Insufficient documentation

## 2019-05-26 DIAGNOSIS — C7951 Secondary malignant neoplasm of bone: Secondary | ICD-10-CM

## 2019-05-26 DIAGNOSIS — E876 Hypokalemia: Secondary | ICD-10-CM | POA: Diagnosis not present

## 2019-05-26 DIAGNOSIS — R197 Diarrhea, unspecified: Secondary | ICD-10-CM | POA: Insufficient documentation

## 2019-05-26 DIAGNOSIS — Z452 Encounter for adjustment and management of vascular access device: Secondary | ICD-10-CM | POA: Diagnosis not present

## 2019-05-26 DIAGNOSIS — H905 Unspecified sensorineural hearing loss: Secondary | ICD-10-CM | POA: Insufficient documentation

## 2019-05-26 DIAGNOSIS — Z95828 Presence of other vascular implants and grafts: Secondary | ICD-10-CM

## 2019-05-26 DIAGNOSIS — D701 Agranulocytosis secondary to cancer chemotherapy: Secondary | ICD-10-CM | POA: Diagnosis not present

## 2019-05-26 DIAGNOSIS — Z5112 Encounter for antineoplastic immunotherapy: Secondary | ICD-10-CM | POA: Insufficient documentation

## 2019-05-26 DIAGNOSIS — G4700 Insomnia, unspecified: Secondary | ICD-10-CM | POA: Insufficient documentation

## 2019-05-26 DIAGNOSIS — R252 Cramp and spasm: Secondary | ICD-10-CM | POA: Insufficient documentation

## 2019-05-26 LAB — CBC WITH DIFFERENTIAL (CANCER CENTER ONLY)
Abs Immature Granulocytes: 0.01 10*3/uL (ref 0.00–0.07)
Basophils Absolute: 0 10*3/uL (ref 0.0–0.1)
Basophils Relative: 1 %
Eosinophils Absolute: 0.1 10*3/uL (ref 0.0–0.5)
Eosinophils Relative: 3 %
HCT: 33.2 % — ABNORMAL LOW (ref 36.0–46.0)
Hemoglobin: 11.2 g/dL — ABNORMAL LOW (ref 12.0–15.0)
Immature Granulocytes: 0 %
Lymphocytes Relative: 23 %
Lymphs Abs: 0.9 10*3/uL (ref 0.7–4.0)
MCH: 29.3 pg (ref 26.0–34.0)
MCHC: 33.7 g/dL (ref 30.0–36.0)
MCV: 86.9 fL (ref 80.0–100.0)
Monocytes Absolute: 0.4 10*3/uL (ref 0.1–1.0)
Monocytes Relative: 9 %
Neutro Abs: 2.6 10*3/uL (ref 1.7–7.7)
Neutrophils Relative %: 64 %
Platelet Count: 178 10*3/uL (ref 150–400)
RBC: 3.82 MIL/uL — ABNORMAL LOW (ref 3.87–5.11)
RDW: 13.5 % (ref 11.5–15.5)
WBC Count: 4 10*3/uL (ref 4.0–10.5)
nRBC: 0 % (ref 0.0–0.2)

## 2019-05-26 LAB — CMP (CANCER CENTER ONLY)
ALT: 14 U/L (ref 0–44)
AST: 15 U/L (ref 15–41)
Albumin: 3.5 g/dL (ref 3.5–5.0)
Alkaline Phosphatase: 58 U/L (ref 38–126)
Anion gap: 8 (ref 5–15)
BUN: 8 mg/dL (ref 6–20)
CO2: 27 mmol/L (ref 22–32)
Calcium: 8.9 mg/dL (ref 8.9–10.3)
Chloride: 107 mmol/L (ref 98–111)
Creatinine: 0.78 mg/dL (ref 0.44–1.00)
GFR, Est AFR Am: >60 mL/min (ref >60)
GFR, Estimated: >60 mL/min (ref >60)
Glucose, Bld: 74 mg/dL (ref 70–99)
Potassium: 3.6 mmol/L (ref 3.5–5.1)
Sodium: 142 mmol/L (ref 135–145)
Total Bilirubin: 1.8 mg/dL — ABNORMAL HIGH (ref 0.3–1.2)
Total Protein: 6.3 g/dL — ABNORMAL LOW (ref 6.5–8.1)

## 2019-05-26 LAB — MAGNESIUM: Magnesium: 1.5 mg/dL — ABNORMAL LOW (ref 1.7–2.4)

## 2019-05-26 MED ORDER — PALONOSETRON HCL INJECTION 0.25 MG/5ML: 0.2500 mg | Freq: Once | INTRAVENOUS | Status: DC

## 2019-05-26 MED ORDER — LORATADINE 10 MG PO TABS
ORAL_TABLET | ORAL | Status: AC
  Filled 2019-05-26: qty 1

## 2019-05-26 MED ORDER — SODIUM CHLORIDE 0.9% FLUSH
10.0000 mL | INTRAVENOUS | Status: DC | PRN
  Administered 2019-05-26: 10 mL via INTRAVENOUS
  Filled 2019-05-26: qty 10

## 2019-05-26 MED ORDER — DEXAMETHASONE SODIUM PHOSPHATE 10 MG/ML IJ SOLN
INTRAMUSCULAR | Status: AC
  Filled 2019-05-26: qty 1

## 2019-05-26 MED ORDER — SODIUM CHLORIDE 0.9 % IV SOLN
Freq: Once | INTRAVENOUS | Status: AC
  Filled 2019-05-26: qty 250

## 2019-05-26 MED ORDER — PALONOSETRON HCL INJECTION 0.25 MG/5ML
INTRAVENOUS | Status: AC
  Filled 2019-05-26: qty 5

## 2019-05-26 MED ORDER — ALTEPLASE 2 MG IJ SOLR
2.0000 mg | Freq: Once | INTRAMUSCULAR | Status: AC
  Administered 2019-05-26: 2 mg
  Filled 2019-05-26: qty 2

## 2019-05-26 MED ORDER — ACETAMINOPHEN 325 MG PO TABS
650.0000 mg | ORAL_TABLET | Freq: Once | ORAL | Status: AC
  Administered 2019-05-26: 650 mg via ORAL

## 2019-05-26 MED ORDER — DEXAMETHASONE SODIUM PHOSPHATE 10 MG/ML IJ SOLN: 5.0000 mg | Freq: Once | INTRAMUSCULAR | Status: DC

## 2019-05-26 MED ORDER — TRASTUZUMAB-DKST CHEMO 150 MG IV SOLR
6.0000 mg/kg | Freq: Once | INTRAVENOUS | Status: AC
  Administered 2019-05-26: 378 mg via INTRAVENOUS
  Filled 2019-05-26: qty 18

## 2019-05-26 MED ORDER — HEPARIN SOD (PORK) LOCK FLUSH 100 UNIT/ML IV SOLN
500.0000 [IU] | Freq: Once | INTRAVENOUS | Status: AC | PRN
  Administered 2019-05-26: 500 [IU]
  Filled 2019-05-26: qty 5

## 2019-05-26 MED ORDER — LORATADINE 10 MG PO TABS
10.0000 mg | ORAL_TABLET | Freq: Every day | ORAL | Status: DC
  Administered 2019-05-26: 10 mg via ORAL

## 2019-05-26 MED ORDER — SODIUM CHLORIDE 0.9% FLUSH
10.0000 mL | INTRAVENOUS | Status: DC | PRN
  Administered 2019-05-26: 10 mL
  Filled 2019-05-26: qty 10

## 2019-05-26 MED ORDER — ACETAMINOPHEN 325 MG PO TABS
ORAL_TABLET | ORAL | Status: AC
  Filled 2019-05-26: qty 2

## 2019-05-26 NOTE — Patient Instructions (Signed)
[facility] °Discharge Instructions for Patients Receiving Chemotherapy ° °Today you received the following chemotherapy agents Trastuzumab ° °To help prevent nausea and vomiting after your treatment, we encourage you to take your nausea medication as directed. °  °If you develop nausea and vomiting that is not controlled by your nausea medication, call the clinic.  ° °BELOW ARE SYMPTOMS THAT SHOULD BE REPORTED IMMEDIATELY: °· *FEVER GREATER THAN 100.5 F °· *CHILLS WITH OR WITHOUT FEVER °· NAUSEA AND VOMITING THAT IS NOT CONTROLLED WITH YOUR NAUSEA MEDICATION °· *UNUSUAL SHORTNESS OF BREATH °· *UNUSUAL BRUISING OR BLEEDING °· TENDERNESS IN MOUTH AND THROAT WITH OR WITHOUT PRESENCE OF ULCERS °· *URINARY PROBLEMS °· *BOWEL PROBLEMS °· UNUSUAL RASH °Items with * indicate a potential emergency and should be followed up as soon as possible. ° °Feel free to call the clinic should you have any questions or concerns. The clinic phone number is [phone]. ° °Please show the CHEMO ALERT CARD at check-in to the Emergency Department and triage nurse. ° ° °

## 2019-05-29 ENCOUNTER — Telehealth: Payer: Self-pay | Admitting: Student in an Organized Health Care Education/Training Program

## 2019-05-29 NOTE — Telephone Encounter (Signed)
Scheduled appt per 5/14 los.  Called and spoke with the recorder for the pt and they stated the mailbox was full.

## 2019-05-31 ENCOUNTER — Telehealth: Payer: Self-pay

## 2019-05-31 NOTE — Telephone Encounter (Signed)
[name] left vm stating she has had a h/a since treatment on Friday. I returned [name]'s call. She states that she actually started feeling sick on Saturday.  She states she has been lethargic, "sick" and with a headache.  I offered her an appt with Dr. Burr Medico tomorrow and she consented.  I have left a vm for the sign language interpretor services requesting a female interpeter tomorrow at 1000.

## 2019-06-01 ENCOUNTER — Inpatient Hospital Stay: Payer: Medicaid Other | Admitting: Hematology

## 2019-06-07 ENCOUNTER — Other Ambulatory Visit: Payer: Self-pay

## 2019-06-07 DIAGNOSIS — C50911 Malignant neoplasm of unspecified site of right female breast: Secondary | ICD-10-CM

## 2019-06-07 MED ORDER — NERATINIB MALEATE 40 MG PO TABS: 80.0000 mg | ORAL_TABLET | Freq: Every day | ORAL | 0 refills | Status: DC

## 2019-06-09 ENCOUNTER — Other Ambulatory Visit: Payer: Self-pay

## 2019-06-09 DIAGNOSIS — C7951 Secondary malignant neoplasm of bone: Secondary | ICD-10-CM

## 2019-06-09 MED ORDER — NERATINIB MALEATE 40 MG PO TABS: 80.0000 mg | ORAL_TABLET | Freq: Every day | ORAL | 0 refills | Status: DC

## 2019-06-09 NOTE — Progress Notes (Signed)
New rx for nerlynx sent with 30 day supply.

## 2019-06-14 NOTE — Progress Notes (Signed)
[facility]   [phone] [phone]) [phone]   Clinic Follow up Note   Patient Care Team: [name], [name], [name] as PCP - General (Physician Assistant) [name], MD as Consulting Physician (Orthopedic Surgery)  Date of Service:  06/16/2019  CHIEF COMPLAINT: F/u ofmetastatic breast cancer  SUMMARY OF ONCOLOGIC HISTORY: Oncology History Overview Note   Cancer Staging No matching staging information was found for the patient.     [name], right ([place])  11/1997 Cancer Diagnosis   Patient had 1.4 cm poorly differentiated right breast carcinoma diagnosed in Nov 1999 at age 4, 1/7 axillary nodes involved, ER/PR and HER 2 positive. She had mastectomy with the 7 axillary node evaluation, 4 cycles of adriamycin/ cytoxan followed by taxotere, then five years of tamoxifen thru June 2005 (no herceptin in 1999). She had bilateral oophorectomy in May 2005. She was briefly on aromatase inhibitor after tamoxifen, but discontinued this herself due to poor tolerance.   04/04/2015 Genetic Testing   Negative genetic testing on the breast/ovarian cancer pnael.  The Breast/Ovarian gene panel offered by [facility] includes sequencing and rearrangement analysis for the following 20 genes:  ATM, BARD1, BRCA1, BRCA2, BRIP1, CDH1, CHEK2, EPCAM, FANCC, MLH1, MSH2, MSH6, NBN, PALB2, PMS2, PTEN, RAD51C, RAD51D, TP53, and XRCC2.   06/26/2015 Pathology Results   Initial Path Report Bone, curettage, Left femur met, intramedullary subtroch tissue - METASTATIC ADENOCARCINOMA.  Estrogen Receptor: 95%, POSITIVE, STRONG STAINING INTENSITY Progesterone Receptor: 2%, POSITIVE, STRONG  HER2 (+), IHC 3+   06/26/2015 Surgery   Biopsy of metastatic tissue and stabilization with Affixus trochanteric nail, proximal and distal interlock of left femur by Dr. [name]    06/28/2015 Imaging   CT Chest w/ Contrast 1. Extensive right internal mammary chain  lymphadenopathy consistent with metastatic breast cancer. 2. Lytic metastases involving the posterior elements at T1 with probable nondisplaced pathologic fracture of the spinous process. 3. No other evidence of thoracic metastatic disease. 4. Trace bilateral pleural effusions with associated bibasilar atelectasis. 5. Indeterminate right thyroid nodule, not previously imaged. This could be evaluated with thyroid ultrasound as clinically warranted.   06/29/2015 -  Chemotherapy   Zometa started monthly on 06/29/15 and switched to q31month from 01/08/17   07/01/2015 Imaging   Bone Scan 1. Increased activity noted throughout the left femur. Although metastatic disease cannot be excluded, these changes are most likely secondary to prior surgery. 2. No other focal abnormalities identified to suggest metastatic disease.   07/12/2015 - 07/26/2015 Radiation Therapy   Left femur, 30 Gy in 10 fractions by Dr. [name]   07/14/2015 Initial Diagnosis   [name] right [place]   07/19/2015 Imaging   Initial PET Scan 1. Severe multifocal osseous metastatic disease, much greater than anticipated based on prior imaging. 2. Multifocal nodal metastases involving the right internal mammary, prevascular and subpectoral lymph nodes. No axillary pulmonary involvement identified. 3. No distant extra osseous metastases.   07/30/2015 - 11/29/2015 Chemotherapy   Docetaxel, Herceptin and Perjeta every 3 weeks X6 cycles, pt tolerated moderately well, restaging scan showed excellent response    09/18/2015 Imaging   CT Head w/wo Contrast 1. No acute intracranial abnormality or significant interval change. 2. Stable minimal periventricular white matter hypoattenuation on the right. This may reflect a remote ischemic injury. 3. No evidence for metastatic disease to the brain. 4. No focal soft tissue lesion to explain the patient's tenderness.   10/30/2015 Imaging   CT  Abdomen Pelvis w/  Contrast 1. Few mildly prominent fluid-filled loops of small bowel scattered throughout the abdomen, with intraluminal fluid density within the distal colon. Given the provided history, findings are suggestive of acute enteritis/diarrheal illness. 2. No other acute intra-abdominal or pelvic process identified. 3. Widespread osseous metastatic disease, better evaluated on most recent PET-CT from 07/19/2015. No pathologic fracture or other complication. 4. 11 mm hypodensity within the left kidney. This lesion measures intermediate density, and is indeterminate. While this lesion is similar in size relative to recent studies, this is increased in size relative to prior study from 2012. Further evaluation with dedicated renal mass protocol CT and/or MRI is recommended for complete characterization.   12/2015 - 05/14/2016 Chemotherapy   Maintenance Herceptin and pejeta every 3 weeks stopped on 05/14/16 due to disease progression   12/25/2015 Imaging   Restaging PET Scan 1. Markedly improved skeletal activity, with only several faint foci of residual accentuated metabolic activity, but resolution of the vast majority of the previously extensive osseous metastatic disease. 2. Resolution of the prior right internal mammary and right prevascular lymph nodes. 3. Residual subcutaneous edema and edema along fascia planes in the left upper thigh. This remains somewhat more than I would expect for placement of an IM nail 6 weeks ago. If there is leg swelling further down, consider Doppler venous ultrasound to rule out left lower extremity DVT. I do not perceive an obvious difference in density between the pelvic and common femoral veins on the noncontrast CT data. 4. Chronic right maxillary and right sphenoid sinusitis.   01/16/2016 - 10/20/2017 Anti-estrogen oral therapy   Letrozole 2.5 mg daily. She was switched to exemestane due to disease progression on 02/08/17. Stopped on 10/20/2017 when treatment changed to chemo     03/01/2016 Miscellaneous   Patient presented to ED following a fall; she reports she landed on her back and is now experiencing back pain. The patient was evaluated and discharge home same day with pain control.   05/01/2016 Imaging   CT CAP IMPRESSION: 1. Stable exam.  No new or progressive disease identified. 2. No evidence for residual or recurrent adenopathy within the chest. 3. Stable bone metastasis.   05/01/2016 Imaging   BONE SCAN IMPRESSION: 1. Small focal uptake involving the anterior rib ends of the left second and right fourth ribs, new since the prior bone scan. The location of this uptake is more suggestive of a traumatic or inflammatory etiology as opposed to metastatic disease. No other evidence of metastatic disease. 2. There are other areas of stable uptake which are most likely degenerative/reactive, including right shoulder and cervical spine uptake and uptake along the right femur adjacent to the ORIF hardware.   05/20/2016 Imaging   NM PET Skull to Thigh IMPRESSION: 1. There multiple metastatic foci in the bony pelvis, including some new abnormal foci compare to the prior PET-CT, compatible with mildly progressive bony metastatic disease. Also, a left T11 vertebral hypermetabolic metastatic lesion is observed, new compared to the prior exam. 2. No active extraosseous malignancy is currently identified. 3. Right anterior fourth rib healing fracture, appears be   06/04/2016 - 01/08/2017 Chemotherapy   Second line chemotherapy Kadcyla every 3 weeks started on 06/04/16 and stopped on 01/08/17 due to mixed response.     10/05/2016 Imaging   CT CAP and Bone Scan:  Bones: left intramedullary rod and left femoral head neck screw in place. In the proximal diaphysis of the left femur there is cortical thickening and irregularity.  No lytic or blastic bone lesions. No fracture or vertebral endplate destruction.   No definite evidence of recurrent disease in the  chest, abdomen, or pelvis.   10/28/2016 Imaging   CT A/P IMPRESSION: No acute process demonstrated in the abdomen or pelvis. No evidence of bowel obstruction or inflammation.   11/04/2016 Imaging   DG foot complete left: IMPRESSION: No fracture or dislocation.  No soft tissue abnormality   11/23/2016 Imaging   CT RIGHT FEMUR IMPRESSION: Negative CT scan of the right thigh.  No visible metastatic disease.  CT LEFT FEMUR IMPRESSION: 1. Postsurgical changes related to prior cephalomedullary rod fixation of the left femur with linear lucency along the anterolateral proximal femoral cortex at level of the lesser trochanter, suspicious for nondisplaced fracture. 2. Slightly permeative appearance of the proximal left femoral cortex at the site of known prior osseous metastases is largely unchanged. 3. Unchanged patchy lucency and sclerosis along the anterior acetabulum, which appears to correspond with an area of faint uptake on the prior PET-CT, suspicious for osseous metastasis. These results will be called to the ordering clinician or representative by the Radiologist Assistant, and communication documented in the PACS or zVision Dashboard.     01/27/2017 Imaging   IMPRESSION: 1. Mixed response to osseous metastasis. Some hypermetabolic lesions are new and increasingly hypermetabolic, while another index lesion is decreasingly hypermetabolic. 2. No evidence of hypermetabolic soft tissue metastasis.    02/08/2017 - 10/19/2017 Antibody Plan   -Herceptin every 3 week since 02/08/17 -Oral Lanpantinib 1549m and decreased to 5077mdue to diarrhea and depression. Due to disease progression she swithced to Verzenio 15057mn 05/04/17. She did not toelrate well so she was switched to Ibrance 100m57m 05/31/17. Due to her neutropenia, Ibrance dose reduced to 75 mg daily, 3 weeks on, one-week off, stopped on 10/20/2017 due to disease progression.    04/30/2017 PET scan   IMPRESSION: 1.  Primarily increase in hypermetabolism of osseous metastasis. An isolated right acetabular lesion is decreased in hypermetabolism since the prior exam. 2. No evidence of hypermetabolic soft tissue metastasis. 3. Similar non FDG avid right-sided thyroid nodule, favoring a benign etiology. Recommend attention on follow-up.   08/30/2017 PET scan   08/30/2017 PET Scan  IMPRESSION: 1. Worsening osseous metastatic disease. 2. Focal hypermetabolism in the central spinal canal the L1 level, similar to the prior exam. Metastatic implant cannot be excluded. 3. Slight enlargement of a right thyroid nodule which now appears more cystic in character. Consider further evaluation with thyroid ultrasound. If patient is clinically hyperthyroid, consider nuclear medicine thyroid uptake and scan   08/30/2017 Progression   08/30/2017 PET Scan  IMPRESSION: 1. Worsening osseous metastatic disease. 2. Focal hypermetabolism in the central spinal canal the L1 level, similar to the prior exam. Metastatic implant cannot be excluded. 3. Slight enlargement of a right thyroid nodule which now appears more cystic in character. Consider further evaluation with thyroid ultrasound. If patient is clinically hyperthyroid, consider nuclear medicine thyroid uptake and scan   09/29/2017 - 10/11/2017 Radiation Therapy   Pt received 27 Gy in 9 fractions directed at the areas of significant uptake noted on recent bone scan the right pelvis and right proximal femur, with Dr, KinaSondra Come10/09/2017 - 04/07/2018 Chemotherapy   -Herceptin every 3 weeks starting 02/08/17, perjeta added on 10/20/2017  -weekly Taxol started on 10/21/2017. Will reduce Taxol to 2 weeks on, 1 week off starting on 02/04/17.  Stoped due to mild disease progression   10/27/2017  Imaging   10/27/2017 CT AP IMPRESSION: 1. No acute abnormality. No evidence of bowel obstruction or acute bowel inflammation. Normal appendix. 2. Stable patchy sclerotic osseous  metastases throughout the visualized skeleton. No new or progressive metastatic disease in the abdomen or pelvis.   01/11/2018 Imaging   Whole Body Bone Scan 01/11/18  IMPRESSION: 1. Multifocal osseous metastases as described. 2. Right scapular metastasis. 3. Right superior thoracic spinous process. 4. Right sacral ala and right L5 metastases. 5. Right acetabular metastasis. 6. Right proximal femur metastasis. 7. Diffuse metastases throughout the left femur. 8. Anterior right fourth rib metastasis.   01/11/2018 Imaging   CT CAP W Contrast 01/11/18  IMPRESSION: 1. Similar-appearing osseous metastatic disease. 2. No evidence for progressed metastatic disease in the chest, abdomen or pelvis.   03/30/2018 Imaging   Bone Scan 03/30/18  IMPRESSION: Stable multifocal bony metastatic disease.   03/30/2018 Imaging    CT CAP W Contrast 03/30/18 IMPRESSION: 1. New mild contiguous wall thickening with associated mucosal hyperenhancement throughout the left colon and rectum, suggesting nonspecific infectious or inflammatory proctocolitis. Differential includes C diff colitis. 2. Stable faint patchy sclerosis in the iliac bones and lumbar vertebral bodies. No new or progressive osseous metastatic disease by CT. Please note that the osseous lesions are relatively occult by CT and would be better evaluated by PET-CT, as clinically warranted. 3. No evidence of extra osseous metastatic disease.  These results will be called to the ordering clinician or representative by the Radiologist Assistant, and communication documented in the PACS or zVision Dashboard.   04/15/2018 - 06/16/2018 Chemotherapy   Enhertu q3weeks starting 04/15/18. Stopped Enhertu after cycle 3 on 05/27/18 due to poor toleration due to poor toleration with anorexia, weight loss, N&V, diarrhea   04/21/2018 PET scan   PET 04/21/18  IMPRESSION: 1. Overall marked interval improvement in bony hypermetabolic disease. All of the  previously identified hypermetabolic osseous metastases have decreased in hypermetabolism in the interval. There is a new tiny focus of hypermetabolism in the right aspect of the L3 vertebral body that appears to correspond to a new 8 mm lucency, raising concern for new metastatic disease. Close attention on follow-up recommended. 2. Focal hypermetabolism in the central spinal canal at the level of L1 previously has resolved. 3. No hypermetabolic soft tissue disease on today's study. 4. Right thyroid nodule seen previously has decreased in the interval.    06/16/2018 Imaging   CT LUMBAR SPINE WO CONTRAST 06/16/18 IMPRESSION: 1. Transitional lumbosacral anatomy. Correlation with radiographs is recommended prior to any operative intervention. 2. Suspicion of a right L3 vertebral body metastasis on the April PET-CT is not evident by CT. Stable degenerative versus metastatic lesion at the right L5 superior endplate. Small but increased right iliac bone metastasis since March. Lumbar MRI would best characterize lumbar metastatic disease (without contrast if necessary). 3. Suspect L2-L3 left subarticular segment disc herniation. Query left L2 radiculitis. 4. Stable appearance of rightward L4-L5 disc degeneration which could affect the right foramen and right lateral recess.   06/17/2018 - 11/16/2018 Chemotherapy   Restart Taxol 2 weeks on/1 week off with Herceptin and Perjeta q3weeks on 06/17/18. Perjeta was stopped after C5 due to diarrhea. Stopped Taxol after 11/16/18 due to disease progression to L3.    07/06/2018 PET scan   PET  IMPRESSION: 1. Near total resolution of hypermetabolic activity associated with skeletal metastasis. Very mild activity associated with the LEFT iliac bone which is similar to background blood pool activity. 2. No new or  progressive disease.   11/21/2018 PET scan   IMPRESSION: 1. New focal hypermetabolic lesion eccentric to the right in the L3 vertebral body,  maximum SUV 6.1 compatible with a new focus of active malignancy. 2. Very low-grade activity similar to blood pool in previous existing mildly sclerotic metastatic lesions including the left iliac bone lesion. Some of the mildly sclerotic lesions are not hypermetabolic compatible with effectively treated bony metastatic disease. 3. No hypermetabolic soft tissue lesions are identified.     11/30/2018 -  Chemotherapy   Continue Herceptin q3weeks    12/12/2018 - 12/17/2018 Chemotherapy   Tucatinib (TUKYSA) 6 tabs BID as a next line therapy starting 12/12/18. Stopped 12/17/18 duet o N&V, poor appetite and HA    12/21/2018 - 03/17/2019 Chemotherapy   IV Eribulin on day 1, 8 every 21 days starting 12/21/18. Stopped 03/17/19 due to mixed response to treatment.    03/21/2019 PET scan   IMPRESSION: 1. Mixed response to therapy of osseous metastasis. Although a right-sided L4 lesion demonstrates decreased hypermetabolism today, new left iliac and right ischial hypermetabolic lesions are seen. 2. No evidence of hypermetabolic soft tissue metastasis.     03/27/2019 -  Chemotherapy   Neratinib (Nerlynx) titrate up from 151m to 2417mstarting 03/27/19. She could not tolerate 16012mnd stopped on 02/05/19 due to N&V and diarrhea. Will restart at 120m13m 04/10/18-04/27/19 and 4/19-4/22. Reduced to 80mg45me daily on 05/10/19      CURRENT THERAPY:  -Zometa q3mont63monthtinue Herceptin q3weeks -Neratinib (Nerlynx)titrate up from 120mg t37m0mg st60mng3/15/21.She could not tolerate160mgand 74mped on 02/05/19 due to N&V and diarrhea. Will restart at 120mg on 364m20-04/27/19 and 4/19-4/22. Reduced to 80mg once 55my on 05/10/19  INTERVAL HISTORY:  Debra C EthRACHELLE EDWARDSr a follow up of ongoing treatment. She presents to the clinic with her interpretor. She notes she is doing well today. She notes she has been experiencing headaches with vertigo. She notes her off balance with walking and frontal  headaches in the past 3 weeks. Her headaches are intermittent. She would take Aleve for her headache which can get to a 5/10. She has been on 80mg Nerlyn64mily and her BM is better with only occasional diarrhea. She is resistant to increasing dose. She notes her right shoulder and left leg pain is stable. She notes when her Nerlynx is delivered it comes very early and she almost missed her.  [place] not want [place] proceed with [place] vaccine.    REVIEW OF SYSTEMS:   Constitutional: Denies fevers, chills or abnormal weight loss (+) Frontal headaches Eyes: Denies blurriness of vision Ears, nose, mouth, throat, and face: Denies mucositis or sore throat Respiratory: Denies cough, dyspnea or wheezes Cardiovascular: Denies palpitation, chest discomfort or lower extremity swelling Gastrointestinal:  Denies nausea, heartburn or change in bowel habits Skin: Denies abnormal skin rashes MSK: (+) Right shoulder and left leg pain stable  Lymphatics: Denies new lymphadenopathy or easy bruising Neurological:Denies numbness, tingling or new weaknesses (+) Occasional off balance ambulation Behavioral/Psych: Mood is stable, no new changes  All other systems were reviewed with the patient and are negative.  MEDICAL HISTORY:  Past Medical History:  Diagnosis Date  . Abnormal Pap smear   . Anxiety   . Breast cancer (HCC)   [place]   [place] chemo, rad  . CIN I (cervical intraepithelial neoplasia I) 2003   by colpo  .  Congenital deafness   . Deaf   . Depression   . Port-A-Cath in place 2017   for chemotherapy  . Radiation 07/12/15-07/26/15   left femur 30 Gy  . S/P bilateral oophorectomy     SURGICAL HISTORY: Past Surgical History:  Procedure Laterality Date  . ABDOMINAL HYSTERECTOMY    . INTRAMEDULLARY (IM) NAIL INTERTROCHANTERIC Left 06/26/2015   Procedure: LEFT BIOMET LONG AFFIXS NAIL;  Surgeon:  [name], MD;  Location: [facility];  Service: Orthopedics;  Laterality: Left;  . IR GENERIC HISTORICAL  08/06/2015   IR US GUIDE VASC ACCESS LEFT 08/06/2015 Aletta Edouard, MD [facility]  . IR GENERIC HISTORICAL  08/06/2015   IR FLUORO GUIDE CV LINE LEFT 08/06/2015 Aletta Edouard, MD [facility]  . IR GENERIC HISTORICAL  03/05/2016   IR CV LINE INJECTION 03/05/2016 Markus Daft, MD [facility]  . LEFT HEART CATH AND CORONARY ANGIOGRAPHY N/A 12/13/2017   Procedure: LEFT HEART CATH AND CORONARY ANGIOGRAPHY;  Surgeon: Leonie Man, MD;  Location: [facility];  Service: Cardiovascular;  Laterality: N/A;  . MASTECTOMY     right breast  . MASTECTOMY    . OVARIAN CYST REMOVAL    . RADIOLOGY WITH ANESTHESIA Left 06/25/2015   Procedure: MRI OF LEFT HIP WITH OR WITHOUT CONTRAST;  Surgeon: Medication Radiologist, MD;  Location: [facility];  Service: Radiology;  Laterality: Left;  DR. MCINTYRE/MRI  . TUBAL LIGATION      I have reviewed the social history and family history with the patient and they are unchanged from previous note.  ALLERGIES:  is allergic to promethazine hcl; chlorhexidine; and diphenhydramine hcl.  MEDICATIONS:  Current Outpatient Medications  Medication Sig Dispense Refill  . ALPRAZolam (XANAX) 0.25 MG tablet Take 1 tablet (0.25 mg total) by mouth 2 (two) times daily as needed for anxiety. 30 tablet 0  . calcium carbonate (TUMS - DOSED IN MG ELEMENTAL CALCIUM) 500 MG chewable tablet Chew 1 tablet by mouth as needed.    . diphenoxylate-atropine (LOMOTIL) 2.5-0.025 MG tablet Take 1-2 tablets by mouth 4 (four) times daily as needed for diarrhea or loose stools. 60 tablet 1  . lidocaine-prilocaine (EMLA) cream Apply 1 application topically as needed. Apply to Porta-Cath 1-2 hours prior to access as directed. 90 g 2  . magnesium oxide (MAG-OX) 400 (241.3 Mg) MG tablet Take 1 tablet (400 mg total) by mouth daily. 30 tablet 3  . Neratinib Maleate (NERLYNX) 40 MG tablet Take 2 tablets  (80 mg total) by mouth daily. Take with food. 60 tablet 0  . ondansetron (ZOFRAN) 8 MG tablet Take 1 tablet (8 mg total) by mouth every 8 (eight) hours as needed for nausea or vomiting. 45 tablet 1  . oxyCODONE (ROXICODONE) 5 MG immediate release tablet Take 1 tablet (5 mg total) by mouth every 8 (eight) hours as needed for severe pain. 60 tablet 0  . potassium chloride (KLOR-CON) 10 MEQ tablet Take 3 tablets (30 mEq total) by mouth daily. 90 tablet 2  . prochlorperazine (COMPAZINE) 10 MG tablet Take 1 tablet (10 mg total) by mouth 2 (two) times daily as needed for nausea or vomiting. 10 tablet 0  . zolpidem (AMBIEN) 5 MG tablet Take 1-2 tablets (5-10 mg total) by mouth at bedtime as needed. 60 tablet 0   No current facility-administered medications for this visit.   Facility-Administered Medications Ordered in Other Visits  Medication Dose Route Frequency Provider Last Rate Last Admin  . heparin lock flush 100  unit/mL  500 Units Intracatheter Once PRN Truitt Merle, MD      . potassium chloride 10 mEq in 100 mL IVPB  10 mEq Intravenous Once Truitt Merle, MD 100 mL/hr at 06/16/19 0944 10 mEq at 06/16/19 0944  . potassium chloride SA (K-DUR) CR tablet 40 mEq  40 mEq Oral Once Truitt Merle, MD      . sodium chloride 0.9 % 1,000 mL with potassium chloride 10 mEq infusion   Intravenous Continuous Gordy Levan, MD   Stopped at 09/10/15 1653  . sodium chloride 0.9 % injection 10 mL  10 mL Intravenous PRN Livesay, Lennis P, MD      . sodium chloride flush (NS) 0.9 % injection 10 mL  10 mL Intracatheter PRN Truitt Merle, MD      . trastuzumab-dkst (OGIVRI) 378 mg in sodium chloride 0.9 % 250 mL chemo infusion  6 mg/kg (Treatment Plan Recorded) Intravenous Once Truitt Merle, MD        PHYSICAL EXAMINATION: ECOG PERFORMANCE STATUS: 1 - Symptomatic but completely ambulatory  Vitals:   06/16/19 0818  BP: 112/69  Pulse: 66  Resp: 18  Temp: 97.7 F (36.5 C)  SpO2: 100%   Filed Weights   06/16/19 0818   Weight: 132 lb 12.8 oz (60.2 kg)    Due to COVID19 we will limit examination to appearance. Patient had no complaints.  GENERAL:alert, no distress and comfortable SKIN: skin color normal, no rashes or significant lesions EYES: normal, Conjunctiva are pink and non-injected, sclera clear  NEURO: alert & oriented x 3 with fluent speech   LABORATORY DATA:  I have reviewed the data as listed CBC Latest Ref Rng & Units 06/16/2019 05/26/2019 05/05/2019  WBC 4.0 - 10.5 K/uL 4.1 4.0 3.6(L)  Hemoglobin 12.0 - 15.0 g/dL 11.2(L) 11.2(L) 10.8(L)  Hematocrit 36.0 - 46.0 % 32.5(L) 33.2(L) 31.7(L)  Platelets 150 - 400 K/uL 186 178 179     CMP Latest Ref Rng & Units 06/16/2019 05/26/2019 05/05/2019  Glucose 70 - 99 mg/dL 87 74 88  BUN 6 - 20 mg/dL _0 Creatinine 0.44 - 1.00 mg/dL 0.86 0.78 0.83  Sodium 135 - 145 mmol/L 143 142 145  Potassium 3.5 - 5.1 mmol/L 3.0(LL) 3.6 2.7(LL)  Chloride 98 - 111 mmol/L 108 107 107  CO2 22 - 32 mmol/L _1 Calcium 8.9 - 10.3 mg/dL 9.2 8.9 8.6(L)  Total Protein 6.5 - 8.1 g/dL 6.2(L) 6.3(L) 6.0(L)  Total Bilirubin 0.3 - 1.2 mg/dL 1.8(H) 1.8(H) 1.8(H)  Alkaline Phos 38 - 126 U/L 58 58 59  AST 15 - 41 U/L 12(L) 15 14(L)  ALT 0 - 44 U/L _2 RADIOGRAPHIC STUDIES: I have personally reviewed the radiological images as listed and agreed with the findings in the report. No results found.   ASSESSMENT & PLAN:  [name] is a 54 y.o. 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 She had right mastectomy, 4 cycles of AC-T and 5 years of Tamoxifen/AI.  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 She has received Target RT. She had excellent response to First-line Docetaxel/Herceptin/Perjeta and AI but progressed on maintenance therapy. She had mixed response on second line Kadcyla, progressed on third-line  Ibrance/Herceptin and poorly tolerated Enhertu.  -She triedTucatinib (TUKYSA)with low doseon 12/12/18 however due to  N&V, poor appetite and HA she stopped on 12/17/18 after trying twice. -I started her on next lineivchemoEribulin on day 1, 8, every 21 days on 12/21/18, along with Herceptin every 3 weeks. -HerPET imagesfrom 03/21/19 showmixed response to therapy of bone metastasis(improvedL4 lesion, new pelvic bone lesions). No evidence of hypermetabolic soft tissue metastasis.  -Istarted her next line treatmentwithNeratinib(Nerlynx) on 03/27/19 titration from 136m to 2428m Unfortunately she could not tolerate neratinib at dose of 16053mth significant nausea and diarrhea, and barely tolerated 120m61mse. She retried at 80mg58me daily Nerlynx starting on 05/08/19. She is tolerating it well now  -Due to high copay she was not able to getBudesonide. She did not take Colestid due to large size of pills. -We will continue Herceptin until thenewly approvedMargetuzimabbecomes available in our clinic, which I recommend.Ifshe is able to tolerateNeratinib, I will add on tamoxifen. -She continues to tolerate 80mg 43mynx well now with occasional diarrhea, manageable. I discussed diarrhea usually improves after first month of treatment. I discussed increasing Nerlynx to 120mg t97me if she tolerates. She declined trying increased dose at this point.  -Given she is stable clinically with stable pain and improving tumor marker, I will post pone her next PET scan to July.  -Labs reviewed, and adequate to proceed with Herceptin today  -Continue Nerlynx 80mg on16maily.  -F/u in 3 weeks, will order PET scan at that time to be done in 6 weeks    2. N&V, Diarrhea -previouslysecondary to Eribulin, but now secondary to Neratinib  -She tolerated 120mg dos34mll but 160mg she 16mped after 3 days due to N&V and liquid diarrhea hourly. -She notes Imodium did not help. She could not afford $1300  Budesonide. She has not taken the alternativeColestid becausethe pills are too large for her to swallow. -Sheinitially hadmanageable side effects on 120mg dose 74mtinibbut N/V/Drecurred with stool incontinence so she retried at 80mg once d60m dose.  -She will continue Lomotil as needed. To help her gain weight I encouraged her to use carnation breakfast throughout the day and drink more fluids. -Her diarrhea has become occasional now on 80mg once da29mNerlynx, will continue management.    3.Body pain, secondary to bone mets, Hypocalcemia, leg cramps  -Since her cancer diagnosis,she has had b/l hip, shoulder and chest painat different time points, likely related to her bone mets -She declined Gabapentin given risk of suicidal thoughts. -She is being treated with Zometa q3months. She 56monthve hypocalcemia with leg cramps secondary to Zometa.  -She will continue oral calcium and Vit Dand Prilosec for Acid Reflux. -Current pain includes, lower back, left upper legand recently her right leg and right shoulder. Her PET from 03/21/19 does indicate disease progression in her b/l pelvic lesions. -Pain is currently controlled on 5mg Oxycodone 28me daily. Pain stable.   4.Congenital deafness  5.Mild anemia, secondary to chemotherapy and malignancy -She will start with oral iron daily for 1-2 months and then stop. She will also start daily women's vitamin. -Stable   6.Hypokalemia, Hypomagnesium, secondary toDiarrhea -She developed diarrhea s/p cycle 3 Perjeta.Has been d/c. Now diarrhea due to Nerlynx. -She is onOral potassium 10meq3 times a 31m She stopped oral Mag because it made her nauseous. -Her K is 3 today (06/16/19). She will receive extra oral 20meq here today69m increase to 4 tabs daily.   7.Goal of care discussion -she understand her goal of care is palliative to prolong her life -she is full code for now  8.Insomnia -She cannot take Benadryl due  to  allergy reaction. She has tried trazodone with no relief.  -She is on Xanax and Ambien 1-2 tabs nightly.I advised her to not take them at the same time.   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 She declined using ice bags using infusion -stable. Will watch on chemo.  10. Neutropenia, secondary to chemo -Seen after C4D1 with ANC 0.4 on 03/07/19 -She has been on Onpro on day 8 which has not been enough. I recommend Granix injections the day after chemo, she is apprehensive due to needles and frequent visits.  -Due to mixed response,Eribulinwas d/c.  11. Mildhyperbilirubinemia -likely second to chemotherapy, fluctuates  -Will monitor closely  12. Headaches and dizziness  -She notes in the past 3 weeks she has had intermittent moderate headaches along with feeling mildly off balance walking occasionally. She relates this to vertigo.  -I recommend Head CT next week to further evaluate and rule out brain mets. She is agreeable. She can not have MRI due to left hip metal screw    PLAN: -CT head w contrast next week with port access in our office before scan  -Labs reviewed and adequate to proceed with Herceptin today  -Continue 64m Nerlynx daily  -Lab, flush, F/u and Herceptin in 3 weeks, will order restaging PET at that time -I will look into her Nerlynx delivery method.  -[facility]    No problem-specific Assessment & Plan notes found for this encounter.   Orders Placed This Encounter  Procedures  . CT Head W Contrast    Standing Status:   Future    Standing Expiration Date:   06/15/2020    Order Specific Question:   If indicated for the ordered procedure, I authorize the administration of contrast media per Radiology protocol    Answer:   Yes    Order Specific Question:   Is patient pregnant?    Answer:   No    Order Specific Question:   [facility]?    [facility]   Order  Specific Question:   Radiology Contrast Protocol - do NOT remove file path    Answer:   [email or web address]   All questions were answered. The patient knows to call the clinic with any problems, questions or concerns. No barriers to learning was detected. The total time spent in the appointment was 30 minutes.     YTruitt Merle MD 06/16/2019   I, AJoslyn Devon am acting as scribe for YTruitt Merle MD.   I have reviewed the above documentation for accuracy and completeness, and I agree with the above.

## 2019-06-15 ENCOUNTER — Other Ambulatory Visit: Payer: Self-pay

## 2019-06-15 DIAGNOSIS — C7951 Secondary malignant neoplasm of bone: Secondary | ICD-10-CM

## 2019-06-16 ENCOUNTER — Inpatient Hospital Stay: Payer: Medicaid Other

## 2019-06-16 ENCOUNTER — Inpatient Hospital Stay: Payer: Medicaid Other | Attending: Hematology

## 2019-06-16 ENCOUNTER — Other Ambulatory Visit: Payer: Self-pay

## 2019-06-16 ENCOUNTER — Inpatient Hospital Stay (HOSPITAL_BASED_OUTPATIENT_CLINIC_OR_DEPARTMENT_OTHER): Payer: Medicaid Other | Admitting: Hematology

## 2019-06-16 ENCOUNTER — Other Ambulatory Visit: Payer: Self-pay | Admitting: Pharmacist

## 2019-06-16 ENCOUNTER — Telehealth: Payer: Self-pay | Admitting: Pharmacist

## 2019-06-16 ENCOUNTER — Encounter: Payer: Self-pay | Admitting: Hematology

## 2019-06-16 ENCOUNTER — Other Ambulatory Visit: Payer: Self-pay | Admitting: Hematology

## 2019-06-16 VITALS — BP 112/69 | HR 66 | Temp 97.7°F | Resp 18 | Ht 65.0 in | Wt 132.8 lb

## 2019-06-16 DIAGNOSIS — R42 Dizziness and giddiness: Secondary | ICD-10-CM | POA: Diagnosis not present

## 2019-06-16 DIAGNOSIS — C7951 Secondary malignant neoplasm of bone: Secondary | ICD-10-CM | POA: Diagnosis not present

## 2019-06-16 DIAGNOSIS — C50911 Malignant neoplasm of unspecified site of right female breast: Secondary | ICD-10-CM

## 2019-06-16 DIAGNOSIS — D6481 Anemia due to antineoplastic chemotherapy: Secondary | ICD-10-CM | POA: Insufficient documentation

## 2019-06-16 DIAGNOSIS — D702 Other drug-induced agranulocytosis: Secondary | ICD-10-CM | POA: Diagnosis not present

## 2019-06-16 DIAGNOSIS — Z170 Estrogen receptor positive status [ER+]: Secondary | ICD-10-CM | POA: Diagnosis not present

## 2019-06-16 DIAGNOSIS — J323 Chronic sphenoidal sinusitis: Secondary | ICD-10-CM | POA: Diagnosis not present

## 2019-06-16 DIAGNOSIS — G4700 Insomnia, unspecified: Secondary | ICD-10-CM | POA: Insufficient documentation

## 2019-06-16 DIAGNOSIS — F419 Anxiety disorder, unspecified: Secondary | ICD-10-CM | POA: Diagnosis not present

## 2019-06-16 DIAGNOSIS — G629 Polyneuropathy, unspecified: Secondary | ICD-10-CM | POA: Diagnosis not present

## 2019-06-16 DIAGNOSIS — H905 Unspecified sensorineural hearing loss: Secondary | ICD-10-CM | POA: Diagnosis not present

## 2019-06-16 DIAGNOSIS — Z5112 Encounter for antineoplastic immunotherapy: Secondary | ICD-10-CM | POA: Insufficient documentation

## 2019-06-16 DIAGNOSIS — K219 Gastro-esophageal reflux disease without esophagitis: Secondary | ICD-10-CM | POA: Insufficient documentation

## 2019-06-16 DIAGNOSIS — D701 Agranulocytosis secondary to cancer chemotherapy: Secondary | ICD-10-CM | POA: Diagnosis not present

## 2019-06-16 DIAGNOSIS — Z923 Personal history of irradiation: Secondary | ICD-10-CM | POA: Insufficient documentation

## 2019-06-16 DIAGNOSIS — Z95828 Presence of other vascular implants and grafts: Secondary | ICD-10-CM

## 2019-06-16 DIAGNOSIS — Z79810 Long term (current) use of selective estrogen receptor modulators (SERMs): Secondary | ICD-10-CM | POA: Diagnosis not present

## 2019-06-16 DIAGNOSIS — E876 Hypokalemia: Secondary | ICD-10-CM | POA: Insufficient documentation

## 2019-06-16 LAB — CBC WITH DIFFERENTIAL (CANCER CENTER ONLY)
Abs Immature Granulocytes: 0.01 10*3/uL (ref 0.00–0.07)
Basophils Absolute: 0 10*3/uL (ref 0.0–0.1)
Basophils Relative: 0 %
Eosinophils Absolute: 0.2 10*3/uL (ref 0.0–0.5)
Eosinophils Relative: 4 %
HCT: 32.5 % — ABNORMAL LOW (ref 36.0–46.0)
Hemoglobin: 11.2 g/dL — ABNORMAL LOW (ref 12.0–15.0)
Immature Granulocytes: 0 %
Lymphocytes Relative: 25 %
Lymphs Abs: 1 10*3/uL (ref 0.7–4.0)
MCH: 29.3 pg (ref 26.0–34.0)
MCHC: 34.5 g/dL (ref 30.0–36.0)
MCV: 85.1 fL (ref 80.0–100.0)
Monocytes Absolute: 0.4 10*3/uL (ref 0.1–1.0)
Monocytes Relative: 9 %
Neutro Abs: 2.5 10*3/uL (ref 1.7–7.7)
Neutrophils Relative %: 62 %
Platelet Count: 186 10*3/uL (ref 150–400)
RBC: 3.82 MIL/uL — ABNORMAL LOW (ref 3.87–5.11)
RDW: 12.9 % (ref 11.5–15.5)
WBC Count: 4.1 10*3/uL (ref 4.0–10.5)
nRBC: 0 % (ref 0.0–0.2)

## 2019-06-16 LAB — CMP (CANCER CENTER ONLY)
ALT: 11 U/L (ref 0–44)
AST: 12 U/L — ABNORMAL LOW (ref 15–41)
Albumin: 3.6 g/dL (ref 3.5–5.0)
Alkaline Phosphatase: 58 U/L (ref 38–126)
Anion gap: 12 (ref 5–15)
BUN: 9 mg/dL (ref 6–20)
CO2: 23 mmol/L (ref 22–32)
Calcium: 9.2 mg/dL (ref 8.9–10.3)
Chloride: 108 mmol/L (ref 98–111)
Creatinine: 0.86 mg/dL (ref 0.44–1.00)
GFR, Est AFR Am: >60 mL/min (ref >60)
GFR, Estimated: >60 mL/min (ref >60)
Glucose, Bld: 87 mg/dL (ref 70–99)
Potassium: 3 mmol/L — CL (ref 3.5–5.1)
Sodium: 143 mmol/L (ref 135–145)
Total Bilirubin: 1.8 mg/dL — ABNORMAL HIGH (ref 0.3–1.2)
Total Protein: 6.2 g/dL — ABNORMAL LOW (ref 6.5–8.1)

## 2019-06-16 LAB — MAGNESIUM: Magnesium: 1.5 mg/dL — ABNORMAL LOW (ref 1.7–2.4)

## 2019-06-16 MED ORDER — NERATINIB MALEATE 40 MG PO TABS: 80.0000 mg | ORAL_TABLET | Freq: Every day | ORAL | 0 refills | Status: DC

## 2019-06-16 MED ORDER — SODIUM CHLORIDE 0.9% FLUSH
10.0000 mL | INTRAVENOUS | Status: DC | PRN
  Administered 2019-06-16: 10 mL
  Filled 2019-06-16: qty 10

## 2019-06-16 MED ORDER — TRASTUZUMAB-DKST CHEMO 150 MG IV SOLR
6.0000 mg/kg | Freq: Once | INTRAVENOUS | Status: AC
  Administered 2019-06-16: 378 mg via INTRAVENOUS
  Filled 2019-06-16: qty 18

## 2019-06-16 MED ORDER — ACETAMINOPHEN 325 MG PO TABS
650.0000 mg | ORAL_TABLET | Freq: Once | ORAL | Status: AC
  Administered 2019-06-16: 650 mg via ORAL

## 2019-06-16 MED ORDER — HEPARIN SOD (PORK) LOCK FLUSH 100 UNIT/ML IV SOLN
500.0000 [IU] | Freq: Once | INTRAVENOUS | Status: AC | PRN
  Administered 2019-06-16: 500 [IU]
  Filled 2019-06-16: qty 5

## 2019-06-16 MED ORDER — POTASSIUM CHLORIDE 10 MEQ/100ML IV SOLN
10.0000 meq | Freq: Once | INTRAVENOUS | Status: AC
  Administered 2019-06-16: 10 meq via INTRAVENOUS

## 2019-06-16 MED ORDER — PALONOSETRON HCL INJECTION 0.25 MG/5ML
0.2500 mg | Freq: Once | INTRAVENOUS | Status: AC
  Administered 2019-06-16: 0.25 mg via INTRAVENOUS

## 2019-06-16 MED ORDER — LORATADINE 10 MG PO TABS
ORAL_TABLET | ORAL | Status: AC
  Filled 2019-06-16: qty 1

## 2019-06-16 MED ORDER — LORATADINE 10 MG PO TABS
10.0000 mg | ORAL_TABLET | Freq: Once | ORAL | Status: AC
  Administered 2019-06-16: 10 mg via ORAL

## 2019-06-16 MED ORDER — ACETAMINOPHEN 325 MG PO TABS
ORAL_TABLET | ORAL | Status: AC
  Filled 2019-06-16: qty 2

## 2019-06-16 MED ORDER — PALONOSETRON HCL INJECTION 0.25 MG/5ML
INTRAVENOUS | Status: AC
  Filled 2019-06-16: qty 5

## 2019-06-16 MED ORDER — SODIUM CHLORIDE 0.9 % IV SOLN
Freq: Once | INTRAVENOUS | Status: AC
  Filled 2019-06-16: qty 250

## 2019-06-16 MED ORDER — DEXAMETHASONE SODIUM PHOSPHATE 10 MG/ML IJ SOLN
INTRAMUSCULAR | Status: AC
  Filled 2019-06-16: qty 1

## 2019-06-16 MED ORDER — DEXAMETHASONE SODIUM PHOSPHATE 10 MG/ML IJ SOLN
5.0000 mg | Freq: Once | INTRAMUSCULAR | Status: AC
  Administered 2019-06-16: 5 mg via INTRAVENOUS

## 2019-06-16 MED ORDER — POTASSIUM CHLORIDE 10 MEQ/100ML IV SOLN
INTRAVENOUS | Status: AC
  Filled 2019-06-16: qty 100

## 2019-06-16 MED ORDER — SODIUM CHLORIDE 0.9% FLUSH
10.0000 mL | Freq: Once | INTRAVENOUS | Status: AC
  Administered 2019-06-16: 10 mL
  Filled 2019-06-16: qty 10

## 2019-06-16 MED FILL — NERLYNX 40 MG TABLET: 40 | 30 days supply | Qty: 60 | Fill #0

## 2019-06-16 NOTE — Telephone Encounter (Signed)
Oral Chemotherapy Pharmacist Encounter   Received a call from Dr. Burr Medico that Ms. Engelstad wanted to go back to filling her medication at [facility]. Patient was initially filling at [facility] but in May a refill prescription was sent out to [facility]. Prescription redirected to [facility] and patient advocate Benjamine Mola will coordinate for patient to be able to pick up her medication following her infusion appt today.  Darl Pikes, PharmD, BCPS, BCOP, CPP Hematology/Oncology Clinical Pharmacist [facility]/AP [facility] [phone]  06/16/2019 10:29 AM

## 2019-06-16 NOTE — Progress Notes (Signed)
CRITICAL VALUE STICKER  CRITICAL VALUE: K+ 3.0  RECEIVER (on-site recipient of call): Manuela Schwartz, [place] NOTIFIED: 06/16/19 @ 0900  MESSENGER (representative from lab): Wyvonnia Dusky  MD NOTIFIED: Dr. Burr Medico  TIME OF NOTIFICATION: 0901  RESPONSE: Seeing patient now

## 2019-06-16 NOTE — Progress Notes (Signed)
K level 3.0 per Dr. Burr Medico patient to receive KCL 10 meq IV today and increase oral K at home to 40 meq daily.  Criselda Peaches, RN notified.

## 2019-06-16 NOTE — Patient Instructions (Addendum)
[facility] Discharge Instructions for Patients Receiving Chemotherapy  Today you received the following chemotherapy agents Trastuzumab  To help prevent nausea and vomiting after your treatment, we encourage you to take your nausea medication as directed   If you develop nausea and vomiting that is not controlled by your nausea medication, call the clinic.   BELOW ARE SYMPTOMS THAT SHOULD BE REPORTED IMMEDIATELY:  *FEVER GREATER THAN 100.5 F  *CHILLS WITH OR WITHOUT FEVER  NAUSEA AND VOMITING THAT IS NOT CONTROLLED WITH YOUR NAUSEA MEDICATION  *UNUSUAL SHORTNESS OF BREATH  *UNUSUAL BRUISING OR BLEEDING  TENDERNESS IN MOUTH AND THROAT WITH OR WITHOUT PRESENCE OF ULCERS  *URINARY PROBLEMS  *BOWEL PROBLEMS  UNUSUAL RASH Items with * indicate a potential emergency and should be followed up as soon as possible.  Feel free to call the clinic should you have any questions or concerns. The clinic phone number is [phone].  Please show the [facility] at check-in to the Emergency Department and triage nurse.  Potassium chloride injection What is this medicine? POTASSIUM CHLORIDE (poe TASS i um KLOOR ide) is a potassium supplement used to prevent and to treat low potassium. Potassium is important for the heart, muscles, and nerves. Too much or too little potassium in the body can cause serious problems. This medicine may be used for other purposes; ask your health care provider or pharmacist if you have questions. COMMON BRAND NAME(S): PROAMP What should I tell my health care provider before I take this medicine? They need to know if you have any of these conditions:  Addison's disease  dehydration  diabetes  heart disease  high levels of potassium in the blood  irregular heartbeat  kidney disease  recent severe burn  an unusual or allergic reaction to potassium, other medicines, foods, dyes, or preservatives  pregnant or trying to get  pregnant  breast-feeding How should I use this medicine? This medicine is for infusion into a vein. It is given by a health care professional in a hospital or clinic setting. Talk to your pediatrician regarding the use of this medicine in children. Special care may be needed. Overdosage: If you think you have taken too much of this medicine contact a poison control center or emergency room at once. NOTE: This medicine is only for you. Do not share this medicine with others. What if I miss a dose? This does not apply. What may interact with this medicine? Do not take this medicine with any of the following medications:  certain diuretics such as spironolactone, triamterene  eplerenone  sodium polystyrene sulfonate This medicine may also interact with the following medications:  certain medicines for blood pressure or heart disease like lisinopril, losartan, quinapril, valsartan  medicines that lower your chance of fighting infection such as cyclosporine, tacrolimus  NSAIDs, medicines for pain and inflammation, like ibuprofen or naproxen  other potassium supplements  salt substitutes This list may not describe all possible interactions. Give your health care provider a list of all the medicines, herbs, non-prescription drugs, or dietary supplements you use. Also tell them if you smoke, drink alcohol, or use illegal drugs. Some items may interact with your medicine. What should I watch for while using this medicine? Your condition will be monitored carefully while you are receiving this medicine. You may need blood work done while you are taking this medicine. What side effects may I notice from receiving this medicine? Side effects that you should report to your doctor or health  care professional as soon as possible:  allergic reactions like skin rash, itching or hives, swelling of the face, lips, or tongue  breathing problems  confusion  fast, irregular heartbeat  feeling  faint or lightheaded, falls  low blood pressure  numbness or tingling in hands or feet  pain, redness, or irritation at site where injected  unusually weak or tired  weakness, heaviness of legs Side effects that usually do not require medical attention (report to your doctor or health care professional if they continue or are bothersome):  diarrhea  nausea, vomiting  stomach pain This list may not describe all possible side effects. Call your doctor for medical advice about side effects. You may report side effects to FDA at 1-800-FDA-1088. Where should I keep my medicine? This drug is given in a hospital or clinic and will not be stored at home. NOTE: This sheet is a summary. It may not cover all possible information. If you have questions about this medicine, talk to your doctor, pharmacist, or health care provider.  2020 Elsevier/Gold Standard (2015-10-02 13:59:20)

## 2019-06-16 NOTE — Patient Instructions (Signed)

## 2019-06-17 LAB — CANCER ANTIGEN 27.29: CA 27.29: 30.4 U/mL (ref 0.0–38.6)

## 2019-06-19 ENCOUNTER — Telehealth: Payer: Self-pay | Admitting: Hematology

## 2019-06-19 NOTE — Telephone Encounter (Signed)
Called pt per 6/4 los - added in 3 wk appt . Left message for patient with appt date and time

## 2019-06-20 ENCOUNTER — Other Ambulatory Visit: Payer: Self-pay | Admitting: Nurse Practitioner

## 2019-06-20 ENCOUNTER — Telehealth: Payer: Self-pay

## 2019-06-20 DIAGNOSIS — R519 Headache, unspecified: Secondary | ICD-10-CM

## 2019-06-20 NOTE — Telephone Encounter (Signed)
Per Cira Rue NP called and scheduled patient's CT. Ct scheduled for this Friday 06/23/19 @7 :30am. Patient is to arrive by 7:15am. Patient is aware of appointment date, time, and location.

## 2019-06-21 ENCOUNTER — Inpatient Hospital Stay: Payer: Medicaid Other

## 2019-06-23 ENCOUNTER — Ambulatory Visit (HOSPITAL_COMMUNITY)
Admission: RE | Admit: 2019-06-23 | Discharge: 2019-06-23 | Disposition: A | Discharge status: Home / Self Care | Payer: Medicaid Other | Source: Ambulatory Visit | Attending: Nurse Practitioner | Admitting: Nurse Practitioner

## 2019-06-23 ENCOUNTER — Other Ambulatory Visit: Payer: Self-pay

## 2019-06-23 ENCOUNTER — Encounter (HOSPITAL_COMMUNITY): Payer: Self-pay

## 2019-06-23 DIAGNOSIS — R4182 Altered mental status, unspecified: Secondary | ICD-10-CM | POA: Diagnosis not present

## 2019-06-23 DIAGNOSIS — R519 Headache, unspecified: Secondary | ICD-10-CM | POA: Insufficient documentation

## 2019-06-23 MED ORDER — SODIUM CHLORIDE (PF) 0.9 % IJ SOLN
INTRAMUSCULAR | Status: AC
  Filled 2019-06-23: qty 50

## 2019-06-23 MED ORDER — HEPARIN SOD (PORK) LOCK FLUSH 100 UNIT/ML IV SOLN
INTRAVENOUS | Status: AC
  Filled 2019-06-23: qty 5

## 2019-06-23 MED ORDER — IOHEXOL 300 MG/ML  SOLN
75.0000 mL | Freq: Once | INTRAMUSCULAR | Status: AC | PRN
  Administered 2019-06-23: 75 mL via INTRAVENOUS

## 2019-06-23 MED ORDER — HEPARIN SOD (PORK) LOCK FLUSH 100 UNIT/ML IV SOLN
500.0000 [IU] | Freq: Once | INTRAVENOUS | Status: AC
  Administered 2019-06-23: 500 [IU] via INTRAVENOUS

## 2019-06-27 ENCOUNTER — Telehealth: Payer: Self-pay

## 2019-06-27 NOTE — Telephone Encounter (Signed)
[name] called requesting results of brain ct.  I returned her call and let her know the scan was negative and did not show any cancer.  She verbalized understanding.

## 2019-06-30 ENCOUNTER — Other Ambulatory Visit: Payer: Self-pay | Admitting: Hematology

## 2019-06-30 ENCOUNTER — Telehealth: Payer: Self-pay

## 2019-06-30 MED ORDER — OXYCODONE HCL 5 MG PO TABS: 5.0000 mg | ORAL_TABLET | Freq: Three times a day (TID) | ORAL | 0 refills | Status: DC | PRN

## 2019-06-30 NOTE — Telephone Encounter (Signed)
Ms Goens called requesting refill for oxycodone.

## 2019-07-07 ENCOUNTER — Other Ambulatory Visit: Payer: Self-pay

## 2019-07-07 ENCOUNTER — Inpatient Hospital Stay: Payer: Medicaid Other

## 2019-07-07 ENCOUNTER — Telehealth: Payer: Self-pay | Admitting: *Deleted

## 2019-07-07 ENCOUNTER — Inpatient Hospital Stay (HOSPITAL_BASED_OUTPATIENT_CLINIC_OR_DEPARTMENT_OTHER): Payer: Medicaid Other | Admitting: Medical

## 2019-07-07 VITALS — BP 131/88 | HR 77 | Temp 97.8°F | Resp 18 | Ht 65.0 in | Wt 134.8 lb

## 2019-07-07 DIAGNOSIS — F5101 Primary insomnia: Secondary | ICD-10-CM | POA: Diagnosis not present

## 2019-07-07 DIAGNOSIS — E876 Hypokalemia: Secondary | ICD-10-CM | POA: Diagnosis not present

## 2019-07-07 DIAGNOSIS — Z95828 Presence of other vascular implants and grafts: Secondary | ICD-10-CM

## 2019-07-07 DIAGNOSIS — Z5112 Encounter for antineoplastic immunotherapy: Secondary | ICD-10-CM | POA: Diagnosis not present

## 2019-07-07 DIAGNOSIS — C7951 Secondary malignant neoplasm of bone: Secondary | ICD-10-CM

## 2019-07-07 DIAGNOSIS — R110 Nausea: Secondary | ICD-10-CM | POA: Diagnosis not present

## 2019-07-07 DIAGNOSIS — C50911 Malignant neoplasm of unspecified site of right female breast: Secondary | ICD-10-CM

## 2019-07-07 DIAGNOSIS — F419 Anxiety disorder, unspecified: Secondary | ICD-10-CM

## 2019-07-07 LAB — CMP (CANCER CENTER ONLY)
ALT: 11 U/L (ref 0–44)
AST: 12 U/L — ABNORMAL LOW (ref 15–41)
Albumin: 3.5 g/dL (ref 3.5–5.0)
Alkaline Phosphatase: 60 U/L (ref 38–126)
Anion gap: 10 (ref 5–15)
BUN: 9 mg/dL (ref 6–20)
CO2: 28 mmol/L (ref 22–32)
Calcium: 9.2 mg/dL (ref 8.9–10.3)
Chloride: 107 mmol/L (ref 98–111)
Creatinine: 0.97 mg/dL (ref 0.44–1.00)
GFR, Est AFR Am: >60 mL/min (ref >60)
GFR, Estimated: >60 mL/min (ref >60)
Glucose, Bld: 101 mg/dL — ABNORMAL HIGH (ref 70–99)
Potassium: 2.9 mmol/L — CL (ref 3.5–5.1)
Sodium: 145 mmol/L (ref 135–145)
Total Bilirubin: 1.7 mg/dL — ABNORMAL HIGH (ref 0.3–1.2)
Total Protein: 6.1 g/dL — ABNORMAL LOW (ref 6.5–8.1)

## 2019-07-07 LAB — CBC WITH DIFFERENTIAL (CANCER CENTER ONLY)
Abs Immature Granulocytes: 0.01 10*3/uL (ref 0.00–0.07)
Basophils Absolute: 0 10*3/uL (ref 0.0–0.1)
Basophils Relative: 1 %
Eosinophils Absolute: 0.1 10*3/uL (ref 0.0–0.5)
Eosinophils Relative: 3 %
HCT: 32.6 % — ABNORMAL LOW (ref 36.0–46.0)
Hemoglobin: 11.1 g/dL — ABNORMAL LOW (ref 12.0–15.0)
Immature Granulocytes: 0 %
Lymphocytes Relative: 24 %
Lymphs Abs: 0.9 10*3/uL (ref 0.7–4.0)
MCH: 29.6 pg (ref 26.0–34.0)
MCHC: 34 g/dL (ref 30.0–36.0)
MCV: 86.9 fL (ref 80.0–100.0)
Monocytes Absolute: 0.3 10*3/uL (ref 0.1–1.0)
Monocytes Relative: 8 %
Neutro Abs: 2.4 10*3/uL (ref 1.7–7.7)
Neutrophils Relative %: 64 %
Platelet Count: 186 10*3/uL (ref 150–400)
RBC: 3.75 MIL/uL — ABNORMAL LOW (ref 3.87–5.11)
RDW: 12.8 % (ref 11.5–15.5)
WBC Count: 3.7 10*3/uL — ABNORMAL LOW (ref 4.0–10.5)
nRBC: 0 % (ref 0.0–0.2)

## 2019-07-07 LAB — VITAMIN D 25 HYDROXY (VIT D DEFICIENCY, FRACTURES): Vit D, 25-Hydroxy: 25.23 ng/mL — ABNORMAL LOW (ref 30–100)

## 2019-07-07 LAB — MAGNESIUM: Magnesium: 1.5 mg/dL — ABNORMAL LOW (ref 1.7–2.4)

## 2019-07-07 MED ORDER — POTASSIUM CHLORIDE CRYS ER 20 MEQ PO TBCR
EXTENDED_RELEASE_TABLET | ORAL | Status: AC
  Filled 2019-07-07: qty 2

## 2019-07-07 MED ORDER — DEXAMETHASONE SODIUM PHOSPHATE 10 MG/ML IJ SOLN
INTRAMUSCULAR | Status: AC
  Filled 2019-07-07: qty 1

## 2019-07-07 MED ORDER — TRASTUZUMAB-DKST CHEMO 150 MG IV SOLR
6.0000 mg/kg | Freq: Once | INTRAVENOUS | Status: AC
  Administered 2019-07-07: 378 mg via INTRAVENOUS
  Filled 2019-07-07: qty 18

## 2019-07-07 MED ORDER — ZOLEDRONIC ACID 4 MG/5ML IV CONC: 4.0000 mg | Freq: Once | INTRAVENOUS | Status: DC

## 2019-07-07 MED ORDER — ACETAMINOPHEN 325 MG PO TABS
650.0000 mg | ORAL_TABLET | Freq: Once | ORAL | Status: AC
  Administered 2019-07-07: 650 mg via ORAL

## 2019-07-07 MED ORDER — ACETAMINOPHEN 325 MG PO TABS
ORAL_TABLET | ORAL | Status: AC
  Filled 2019-07-07: qty 2

## 2019-07-07 MED ORDER — DEXAMETHASONE SODIUM PHOSPHATE 10 MG/ML IJ SOLN
5.0000 mg | Freq: Once | INTRAMUSCULAR | Status: AC
  Administered 2019-07-07: 5 mg via INTRAVENOUS

## 2019-07-07 MED ORDER — ZOLPIDEM TARTRATE 5 MG PO TABS: 5.0000 mg | ORAL_TABLET | Freq: Every evening | ORAL | 0 refills | Status: DC | PRN

## 2019-07-07 MED ORDER — PALONOSETRON HCL INJECTION 0.25 MG/5ML
0.2500 mg | Freq: Once | INTRAVENOUS | Status: AC
  Administered 2019-07-07: 0.25 mg via INTRAVENOUS

## 2019-07-07 MED ORDER — ZOLEDRONIC ACID 4 MG/100ML IV SOLN
INTRAVENOUS | Status: AC
  Filled 2019-07-07: qty 100

## 2019-07-07 MED ORDER — HEPARIN SOD (PORK) LOCK FLUSH 100 UNIT/ML IV SOLN
500.0000 [IU] | Freq: Once | INTRAVENOUS | Status: AC | PRN
  Administered 2019-07-07: 500 [IU]
  Filled 2019-07-07: qty 5

## 2019-07-07 MED ORDER — ZOLEDRONIC ACID 4 MG/100ML IV SOLN
4.0000 mg | Freq: Once | INTRAVENOUS | Status: AC
  Administered 2019-07-07: 4 mg via INTRAVENOUS

## 2019-07-07 MED ORDER — LORATADINE 10 MG PO TABS
ORAL_TABLET | ORAL | Status: AC
  Filled 2019-07-07: qty 1

## 2019-07-07 MED ORDER — LORATADINE 10 MG PO TABS
10.0000 mg | ORAL_TABLET | Freq: Every day | ORAL | Status: DC
  Administered 2019-07-07: 10 mg via ORAL

## 2019-07-07 MED ORDER — ONDANSETRON HCL 8 MG PO TABS: 8.0000 mg | ORAL_TABLET | Freq: Three times a day (TID) | ORAL | 1 refills | Status: DC | PRN

## 2019-07-07 MED ORDER — SODIUM CHLORIDE 0.9% FLUSH
10.0000 mL | INTRAVENOUS | Status: DC | PRN
  Administered 2019-07-07: 10 mL
  Filled 2019-07-07: qty 10

## 2019-07-07 MED ORDER — POTASSIUM CHLORIDE CRYS ER 20 MEQ PO TBCR: 40.0000 meq | EXTENDED_RELEASE_TABLET | Freq: Once | ORAL | Status: DC

## 2019-07-07 MED ORDER — PALONOSETRON HCL INJECTION 0.25 MG/5ML
INTRAVENOUS | Status: AC
  Filled 2019-07-07: qty 5

## 2019-07-07 MED ORDER — ALPRAZOLAM 0.25 MG PO TABS: 0.2500 mg | ORAL_TABLET | Freq: Two times a day (BID) | ORAL | 0 refills | Status: DC | PRN

## 2019-07-07 MED ORDER — SODIUM CHLORIDE 0.9 % IV SOLN
Freq: Once | INTRAVENOUS | Status: AC
  Filled 2019-07-07: qty 250

## 2019-07-07 NOTE — Progress Notes (Signed)
[name]   Telephone:([phone]) [phone] Fax:(336) [phone]   Clinic Follow up Note   Patient Care Team: [name], [name], [name] as PCP - General (Physician Assistant) [name], MD as Consulting Physician (Orthopedic Surgery)  Date of Service:  07/07/2019  CHIEF COMPLAINT: F/u ofmetastatic breast cancer. The patient presents for cycle #10 of Ogivri and Zometa. She is also treated with Neratinib 40 mg 2 tablets once daily. This was reduced to 40 mg 1 tablet once daily due to headaches. She has not taken this today and would like to hold it until next week as her headaches have only occurred while on this medication.  SUMMARY OF ONCOLOGIC HISTORY: Oncology History Overview Note   Cancer Staging No matching staging information was found for the patient.     [name], right ([place])  11/1997 Cancer Diagnosis   Patient had 1.4 cm poorly differentiated right breast carcinoma diagnosed in Nov 1999 at age 54, 1/7 axillary nodes involved, ER/PR and HER 2 positive. She had mastectomy with the 7 axillary node evaluation, 4 cycles of adriamycin/ cytoxan followed by taxotere, then five years of tamoxifen thru June 2005 (no herceptin in 1999). She had bilateral oophorectomy in May 2005. She was briefly on aromatase inhibitor after tamoxifen, but discontinued this herself due to poor tolerance.   04/04/2015 Genetic Testing   Negative genetic testing on the breast/ovarian cancer pnael.  The Breast/Ovarian gene panel offered by [facility] includes sequencing and rearrangement analysis for the following 20 genes:  ATM, BARD1, BRCA1, BRCA2, BRIP1, CDH1, CHEK2, EPCAM, FANCC, MLH1, MSH2, MSH6, NBN, PALB2, PMS2, PTEN, RAD51C, RAD51D, TP53, and XRCC2.   06/26/2015 Pathology Results   Initial Path Report Bone, curettage, Left femur met, intramedullary subtroch tissue - METASTATIC ADENOCARCINOMA.  Estrogen Receptor: 95%, POSITIVE, STRONG STAINING  INTENSITY Progesterone Receptor: 2%, POSITIVE, STRONG  HER2 (+), IHC 3+   06/26/2015 Surgery   Biopsy of metastatic tissue and stabilization with Affixus trochanteric nail, proximal and distal interlock of left femur by Dr. [name]    06/28/2015 Imaging   CT Chest w/ Contrast 1. Extensive right internal mammary chain lymphadenopathy consistent with metastatic breast cancer. 2. Lytic metastases involving the posterior elements at T1 with probable nondisplaced pathologic fracture of the spinous process. 3. No other evidence of thoracic metastatic disease. 4. Trace bilateral pleural effusions with associated bibasilar atelectasis. 5. Indeterminate right thyroid nodule, not previously imaged. This could be evaluated with thyroid ultrasound as clinically warranted.   06/29/2015 -  Chemotherapy   Zometa started monthly on 06/29/15 and switched to q17month from 01/08/17   07/01/2015 Imaging   Bone Scan 1. Increased activity noted throughout the left femur. Although metastatic disease cannot be excluded, these changes are most likely secondary to prior surgery. 2. No other focal abnormalities identified to suggest metastatic disease.   07/12/2015 - 07/26/2015 Radiation Therapy   Left femur, 30 Gy in 10 fractions by Dr. [name]   07/14/2015 Initial Diagnosis   [name]   07/19/2015 Imaging   Initial PET Scan 1. Severe multifocal osseous metastatic disease, much greater than anticipated based on prior imaging. 2. Multifocal nodal metastases involving the right internal mammary, prevascular and subpectoral lymph nodes. No axillary pulmonary involvement identified. 3. No distant extra osseous metastases.   07/30/2015 - 11/29/2015 Chemotherapy   Docetaxel, Herceptin and Perjeta every 3 weeks X6 cycles, pt tolerated moderately well, restaging scan showed excellent response    09/18/2015  Imaging   CT Head w/wo Contrast 1. No acute intracranial abnormality or  significant interval change. 2. Stable minimal periventricular white matter hypoattenuation on the right. This may reflect a remote ischemic injury. 3. No evidence for metastatic disease to the brain. 4. No focal soft tissue lesion to explain the patient's tenderness.   10/30/2015 Imaging   CT Abdomen Pelvis w/ Contrast 1. Few mildly prominent fluid-filled loops of small bowel scattered throughout the abdomen, with intraluminal fluid density within the distal colon. Given the provided history, findings are suggestive of acute enteritis/diarrheal illness. 2. No other acute intra-abdominal or pelvic process identified. 3. Widespread osseous metastatic disease, better evaluated on most recent PET-CT from 07/19/2015. No pathologic fracture or other complication. 4. 11 mm hypodensity within the left kidney. This lesion measures intermediate density, and is indeterminate. While this lesion is similar in size relative to recent studies, this is increased in size relative to prior study from 2012. Further evaluation with dedicated renal mass protocol CT and/or MRI is recommended for complete characterization.   12/2015 - 05/14/2016 Chemotherapy   Maintenance Herceptin and pejeta every 3 weeks stopped on 05/14/16 due to disease progression   12/25/2015 Imaging   Restaging PET Scan 1. Markedly improved skeletal activity, with only several faint foci of residual accentuated metabolic activity, but resolution of the vast majority of the previously extensive osseous metastatic disease. 2. Resolution of the prior right internal mammary and right prevascular lymph nodes. 3. Residual subcutaneous edema and edema along fascia planes in the left upper thigh. This remains somewhat more than I would expect for placement of an IM nail 6 weeks ago. If there is leg swelling further down, consider Doppler venous ultrasound to rule out left lower extremity DVT. I do not perceive an obvious difference in density between the  pelvic and common femoral veins on the noncontrast CT data. 4. Chronic right maxillary and right sphenoid sinusitis.   01/16/2016 - 10/20/2017 Anti-estrogen oral therapy   Letrozole 2.5 mg daily. She was switched to exemestane due to disease progression on 02/08/17. Stopped on 10/20/2017 when treatment changed to chemo    03/01/2016 Miscellaneous   Patient presented to ED following a fall; she reports she landed on her back and is now experiencing back pain. The patient was evaluated and discharge home same day with pain control.   05/01/2016 Imaging   CT CAP IMPRESSION: 1. Stable exam.  No new or progressive disease identified. 2. No evidence for residual or recurrent adenopathy within the chest. 3. Stable bone metastasis.   05/01/2016 Imaging   BONE SCAN IMPRESSION: 1. Small focal uptake involving the anterior rib ends of the left second and right fourth ribs, new since the prior bone scan. The location of this uptake is more suggestive of a traumatic or inflammatory etiology as opposed to metastatic disease. No other evidence of metastatic disease. 2. There are other areas of stable uptake which are most likely degenerative/reactive, including right shoulder and cervical spine uptake and uptake along the right femur adjacent to the ORIF hardware.   05/20/2016 Imaging   NM PET Skull to Thigh IMPRESSION: 1. There multiple metastatic foci in the bony pelvis, including some new abnormal foci compare to the prior PET-CT, compatible with mildly progressive bony metastatic disease. Also, a left T11 vertebral hypermetabolic metastatic lesion is observed, new compared to the prior exam. 2. No active extraosseous malignancy is currently identified. 3. Right anterior fourth rib healing fracture, appears be   06/04/2016 - 01/08/2017 Chemotherapy  Second line chemotherapy Kadcyla every 3 weeks started on 06/04/16 and stopped on 01/08/17 due to mixed response.     10/05/2016 Imaging   CT CAP  and Bone Scan:  Bones: left intramedullary rod and left femoral head neck screw in place. In the proximal diaphysis of the left femur there is cortical thickening and irregularity. No lytic or blastic bone lesions. No fracture or vertebral endplate destruction.   No definite evidence of recurrent disease in the chest, abdomen, or pelvis.   10/28/2016 Imaging   CT A/P IMPRESSION: No acute process demonstrated in the abdomen or pelvis. No evidence of bowel obstruction or inflammation.   11/04/2016 Imaging   DG foot complete left: IMPRESSION: No fracture or dislocation.  No soft tissue abnormality   11/23/2016 Imaging   CT RIGHT FEMUR IMPRESSION: Negative CT scan of the right thigh.  No visible metastatic disease.  CT LEFT FEMUR IMPRESSION: 1. Postsurgical changes related to prior cephalomedullary rod fixation of the left femur with linear lucency along the anterolateral proximal femoral cortex at level of the lesser trochanter, suspicious for nondisplaced fracture. 2. Slightly permeative appearance of the proximal left femoral cortex at the site of known prior osseous metastases is largely unchanged. 3. Unchanged patchy lucency and sclerosis along the anterior acetabulum, which appears to correspond with an area of faint uptake on the prior PET-CT, suspicious for osseous metastasis. These results will be called to the ordering clinician or representative by the Radiologist Assistant, and communication documented in the PACS or zVision Dashboard.     01/27/2017 Imaging   IMPRESSION: 1. Mixed response to osseous metastasis. Some hypermetabolic lesions are new and increasingly hypermetabolic, while another index lesion is decreasingly hypermetabolic. 2. No evidence of hypermetabolic soft tissue metastasis.    02/08/2017 - 10/19/2017 Antibody Plan   -Herceptin every 3 week since 02/08/17 -Oral Lanpantinib 1541m and decreased to 5058mdue to diarrhea and depression. Due to  disease progression she swithced to Verzenio 15045mn 05/04/17. She did not toelrate well so she was switched to Ibrance 100m64m 05/31/17. Due to her neutropenia, Ibrance dose reduced to 75 mg daily, 3 weeks on, one-week off, stopped on 10/20/2017 due to disease progression.    04/30/2017 PET scan   IMPRESSION: 1. Primarily increase in hypermetabolism of osseous metastasis. An isolated right acetabular lesion is decreased in hypermetabolism since the prior exam. 2. No evidence of hypermetabolic soft tissue metastasis. 3. Similar non FDG avid right-sided thyroid nodule, favoring a benign etiology. Recommend attention on follow-up.   08/30/2017 PET scan   08/30/2017 PET Scan  IMPRESSION: 1. Worsening osseous metastatic disease. 2. Focal hypermetabolism in the central spinal canal the L1 level, similar to the prior exam. Metastatic implant cannot be excluded. 3. Slight enlargement of a right thyroid nodule which now appears more cystic in character. Consider further evaluation with thyroid ultrasound. If patient is clinically hyperthyroid, consider nuclear medicine thyroid uptake and scan   08/30/2017 Progression   08/30/2017 PET Scan  IMPRESSION: 1. Worsening osseous metastatic disease. 2. Focal hypermetabolism in the central spinal canal the L1 level, similar to the prior exam. Metastatic implant cannot be excluded. 3. Slight enlargement of a right thyroid nodule which now appears more cystic in character. Consider further evaluation with thyroid ultrasound. If patient is clinically hyperthyroid, consider nuclear medicine thyroid uptake and scan   09/29/2017 - 10/11/2017 Radiation Therapy   Pt received 27 Gy in 9 fractions directed at the areas of significant uptake noted on recent bone scan  the right pelvis and right proximal femur, with Dr, Sondra Come    10/20/2017 - 04/07/2018 Chemotherapy   -Herceptin every 3 weeks starting 02/08/17, perjeta added on 10/20/2017  -weekly Taxol started on  10/21/2017. Will reduce Taxol to 2 weeks on, 1 week off starting on 02/04/17.  Stoped due to mild disease progression   10/27/2017 Imaging   10/27/2017 CT AP IMPRESSION: 1. No acute abnormality. No evidence of bowel obstruction or acute bowel inflammation. Normal appendix. 2. Stable patchy sclerotic osseous metastases throughout the visualized skeleton. No new or progressive metastatic disease in the abdomen or pelvis.   01/11/2018 Imaging   Whole Body Bone Scan 01/11/18  IMPRESSION: 1. Multifocal osseous metastases as described. 2. Right scapular metastasis. 3. Right superior thoracic spinous process. 4. Right sacral ala and right L5 metastases. 5. Right acetabular metastasis. 6. Right proximal femur metastasis. 7. Diffuse metastases throughout the left femur. 8. Anterior right fourth rib metastasis.   01/11/2018 Imaging   CT CAP W Contrast 01/11/18  IMPRESSION: 1. Similar-appearing osseous metastatic disease. 2. No evidence for progressed metastatic disease in the chest, abdomen or pelvis.   03/30/2018 Imaging   Bone Scan 03/30/18  IMPRESSION: Stable multifocal bony metastatic disease.   03/30/2018 Imaging    CT CAP W Contrast 03/30/18 IMPRESSION: 1. New mild contiguous wall thickening with associated mucosal hyperenhancement throughout the left colon and rectum, suggesting nonspecific infectious or inflammatory proctocolitis. Differential includes C diff colitis. 2. Stable faint patchy sclerosis in the iliac bones and lumbar vertebral bodies. No new or progressive osseous metastatic disease by CT. Please note that the osseous lesions are relatively occult by CT and would be better evaluated by PET-CT, as clinically warranted. 3. No evidence of extra osseous metastatic disease.  These results will be called to the ordering clinician or representative by the Radiologist Assistant, and communication documented in the PACS or zVision Dashboard.   04/15/2018 - 06/16/2018  Chemotherapy   Enhertu q3weeks starting 04/15/18. Stopped Enhertu after cycle 3 on 05/27/18 due to poor toleration due to poor toleration with anorexia, weight loss, N&V, diarrhea   04/21/2018 PET scan   PET 04/21/18  IMPRESSION: 1. Overall marked interval improvement in bony hypermetabolic disease. All of the previously identified hypermetabolic osseous metastases have decreased in hypermetabolism in the interval. There is a new tiny focus of hypermetabolism in the right aspect of the L3 vertebral body that appears to correspond to a new 8 mm lucency, raising concern for new metastatic disease. Close attention on follow-up recommended. 2. Focal hypermetabolism in the central spinal canal at the level of L1 previously has resolved. 3. No hypermetabolic soft tissue disease on today's study. 4. Right thyroid nodule seen previously has decreased in the interval.    06/16/2018 Imaging   CT LUMBAR SPINE WO CONTRAST 06/16/18 IMPRESSION: 1. Transitional lumbosacral anatomy. Correlation with radiographs is recommended prior to any operative intervention. 2. Suspicion of a right L3 vertebral body metastasis on the April PET-CT is not evident by CT. Stable degenerative versus metastatic lesion at the right L5 superior endplate. Small but increased right iliac bone metastasis since March. Lumbar MRI would best characterize lumbar metastatic disease (without contrast if necessary). 3. Suspect L2-L3 left subarticular segment disc herniation. Query left L2 radiculitis. 4. Stable appearance of rightward L4-L5 disc degeneration which could affect the right foramen and right lateral recess.   06/17/2018 - 11/16/2018 Chemotherapy   Restart Taxol 2 weeks on/1 week off with Herceptin and Perjeta q3weeks on 06/17/18. Perjeta was stopped  after C5 due to diarrhea. Stopped Taxol after 11/16/18 due to disease progression to L3.    07/06/2018 PET scan   PET  IMPRESSION: 1. Near total resolution of hypermetabolic  activity associated with skeletal metastasis. Very mild activity associated with the LEFT iliac bone which is similar to background blood pool activity. 2. No new or progressive disease.   11/21/2018 PET scan   IMPRESSION: 1. New focal hypermetabolic lesion eccentric to the right in the L3 vertebral body, maximum SUV 6.1 compatible with a new focus of active malignancy. 2. Very low-grade activity similar to blood pool in previous existing mildly sclerotic metastatic lesions including the left iliac bone lesion. Some of the mildly sclerotic lesions are not hypermetabolic compatible with effectively treated bony metastatic disease. 3. No hypermetabolic soft tissue lesions are identified.     11/30/2018 -  Chemotherapy   Continue Herceptin q3weeks    12/12/2018 - 12/17/2018 Chemotherapy   Tucatinib (TUKYSA) 6 tabs BID as a next line therapy starting 12/12/18. Stopped 12/17/18 duet o N&V, poor appetite and HA    12/21/2018 - 03/17/2019 Chemotherapy   IV Eribulin on day 1, 8 every 21 days starting 12/21/18. Stopped 03/17/19 due to mixed response to treatment.    03/21/2019 PET scan   IMPRESSION: 1. Mixed response to therapy of osseous metastasis. Although a right-sided L4 lesion demonstrates decreased hypermetabolism today, new left iliac and right ischial hypermetabolic lesions are seen. 2. No evidence of hypermetabolic soft tissue metastasis.     03/27/2019 -  Chemotherapy   Neratinib (Nerlynx) titrate up from 153m to 2462mstarting 03/27/19. She could not tolerate 16047mnd stopped on 02/05/19 due to N&V and diarrhea. Will restart at 120m33m 04/10/18-04/27/19 and 4/19-4/22. Reduced to 80mg27me daily on 05/10/19      CURRENT THERAPY:  -Zometa q3mont41monthtinue Herceptin q3weeks -Neratinib (Nerlynx)titrate up from 120mg t17m0mg st24mng3/15/21.She could not tolerate160mgand 42mped on 02/05/19 due to N&V and diarrhea. Will restart at 120mg on 346m20-04/27/19 and 4/19-4/22. Reduced  to 80mg once 63my on 05/10/19  INTERVAL HISTORY:  Debra C EthTARIA CASTRILLOo. fem39e with a diagnosis of metastatic breast cancer. She is followed by Dr. Yan Feng anTruitt Merlets today for cycle #10 of Ogivri and Zometa. She is also treated with Neratinib 40 mg 2 tablets once daily. This was reduced to 40 mg 1 tablet once daily due to headaches. She has not taken this today and would like to hold it until next week as her headaches have only occurred while on this medication. She is status post a head CT from 06/23/2019 with results as follows:  FINDINGS: Brain: Normal cerebral volume. There is mild chronic white matter hypodensity near the right frontal horn and subjacent to the insula which appears stable from the prior exams. Gray-white matter differentiation elsewhere is within normal limits. No midline shift, ventriculomegaly, mass effect, evidence of mass lesion, intracranial hemorrhage or evidence of cortically based acute infarction. No abnormal enhancement identified.  Vascular: The major dural venous stretch that the major intracranial vascular structures are enhancing as expected.  Skull: Stable since 2017 and normal.  Sinuses/Orbits: Visualized paranasal sinuses and mastoids are clear.  Other: Visualized orbits and scalp soft tissues are within normal limits.  IMPRESSION: 1. No acute intracranial abnormality or metastatic diseaseidentified. 2. Chronic white matter disease in the right hemisphere is stable from prior exams.  Review of Systems  Constitutional: Negative for chills, diaphoresis, fever and malaise/fatigue.  HENT: Positive for hearing loss.  Respiratory: Negative for cough, sputum production and shortness of breath.   Cardiovascular: Negative for chest pain and palpitations.  Gastrointestinal: Negative for abdominal pain, constipation, diarrhea, nausea and vomiting.  Genitourinary: Negative for dysuria.  Musculoskeletal: Negative for back pain and neck pain.   Skin: Negative for itching and rash.  Neurological: Positive for headaches. Negative for dizziness.    MEDICAL HISTORY:  Past Medical History:  Diagnosis Date  . Abnormal Pap smear   . Anxiety   . Breast cancer ([place])   . Cancer ([place]) 1999   breast-s/p mastectomy, chemo, rad  . CIN I (cervical intraepithelial neoplasia I) 2003   by colpo  . Congenital deafness   . Deaf   . Depression   . Port-A-Cath in place 2017   for chemotherapy  . Radiation 07/12/15-07/26/15   left femur 30 Gy  . S/P bilateral oophorectomy     SURGICAL HISTORY: Past Surgical History:  Procedure Laterality Date  . ABDOMINAL HYSTERECTOMY    . INTRAMEDULLARY (IM) NAIL INTERTROCHANTERIC Left 06/26/2015   Procedure: LEFT BIOMET LONG AFFIXS NAIL;  Surgeon: [name], MD;  Location: [facility];  Service: Orthopedics;  Laterality: Left;  . IR GENERIC HISTORICAL  08/06/2015   IR US GUIDE VASC ACCESS LEFT 08/06/2015 Aletta Edouard, MD [facility]  . IR GENERIC HISTORICAL  08/06/2015   IR FLUORO GUIDE CV LINE LEFT 08/06/2015 Aletta Edouard, MD [facility]  . IR GENERIC HISTORICAL  03/05/2016   IR CV LINE INJECTION 03/05/2016 Markus Daft, MD [facility]  . LEFT HEART CATH AND CORONARY ANGIOGRAPHY N/A 12/13/2017   Procedure: LEFT HEART CATH AND CORONARY ANGIOGRAPHY;  Surgeon: Leonie Man, MD;  Location: [facility];  Service: Cardiovascular;  Laterality: N/A;  . MASTECTOMY     right breast  . MASTECTOMY    . OVARIAN CYST REMOVAL    . RADIOLOGY WITH ANESTHESIA Left 06/25/2015   Procedure: MRI OF LEFT HIP WITH OR WITHOUT CONTRAST;  Surgeon: Medication Radiologist, MD;  Location: [facility];  Service: Radiology;  Laterality: Left;  DR. MCINTYRE/MRI  . TUBAL LIGATION      I have reviewed the social history and family history with the patient and they are unchanged from previous note.  ALLERGIES:  is allergic to promethazine hcl, chlorhexidine, and diphenhydramine hcl.  MEDICATIONS:  Current Outpatient  Medications  Medication Sig Dispense Refill  . ALPRAZolam (XANAX) 0.25 MG tablet Take 1 tablet (0.25 mg total) by mouth 2 (two) times daily as needed for anxiety. 30 tablet 0  . calcium carbonate (TUMS - DOSED IN MG ELEMENTAL CALCIUM) 500 MG chewable tablet Chew 1 tablet by mouth as needed.    . diphenoxylate-atropine (LOMOTIL) 2.5-0.025 MG tablet Take 1-2 tablets by mouth 4 (four) times daily as needed for diarrhea or loose stools. 60 tablet 1  . lidocaine-prilocaine (EMLA) cream Apply 1 application topically as needed. Apply to Porta-Cath 1-2 hours prior to access as directed. 90 g 2  . magnesium oxide (MAG-OX) 400 (241.3 Mg) MG tablet Take 1 tablet (400 mg total) by mouth daily. 30 tablet 3  . Neratinib Maleate (NERLYNX) 40 MG tablet Take 2 tablets (80 mg total) by mouth daily. Take with food. 60 tablet 0  . ondansetron (ZOFRAN) 8 MG tablet Take 1 tablet (8 mg total) by mouth every 8 (eight) hours as needed for nausea or vomiting. 45 tablet 1  . oxyCODONE (ROXICODONE) 5 MG immediate release tablet Take 1 tablet (5 mg total) by mouth every 8 (  eight) hours as needed for severe pain. 60 tablet 0  . potassium chloride (KLOR-CON) 10 MEQ tablet Take 3 tablets (30 mEq total) by mouth daily. 90 tablet 2  . prochlorperazine (COMPAZINE) 10 MG tablet Take 1 tablet (10 mg total) by mouth 2 (two) times daily as needed for nausea or vomiting. 10 tablet 0  . zolpidem (AMBIEN) 5 MG tablet Take 1-2 tablets (5-10 mg total) by mouth at bedtime as needed. 60 tablet 0   No current facility-administered medications for this visit.   Facility-Administered Medications Ordered in Other Visits  Medication Dose Route Frequency Provider Last Rate Last Admin  . potassium chloride SA (K-DUR) CR tablet 40 mEq  40 mEq Oral Once Truitt Merle, MD      . sodium chloride 0.9 % 1,000 mL with potassium chloride 10 mEq infusion   Intravenous Continuous Gordy Levan, MD   Stopped at 09/10/15 1653  . sodium chloride 0.9 % injection  10 mL  10 mL Intravenous PRN Livesay, Tamala Julian, MD        Physical Exam Constitutional:      General: She is not in acute distress.    Appearance: Normal appearance. She is not ill-appearing.  HENT:     Head: Normocephalic and atraumatic.  Eyes:     General: No scleral icterus.       Right eye: No discharge.        Left eye: No discharge.     Conjunctiva/sclera: Conjunctivae normal.  Cardiovascular:     Rate and Rhythm: Normal rate and regular rhythm.     Heart sounds: No murmur heard.  No friction rub. No gallop.   Pulmonary:     Effort: Pulmonary effort is normal. No respiratory distress.     Breath sounds: Normal breath sounds. No wheezing, rhonchi or rales.  Abdominal:     General: Bowel sounds are normal. There is no distension.     Tenderness: There is no abdominal tenderness. There is no guarding.  Musculoskeletal:        General: Normal range of motion.     Right lower leg: No edema.     Left lower leg: No edema.  Skin:    General: Skin is warm and dry.     Coloration: Skin is not jaundiced.     Findings: No bruising, erythema or lesion.  Neurological:     Mental Status: She is alert.     LABORATORY DATA:  I have reviewed the data as listed CBC Latest Ref Rng & Units 06/16/2019 05/26/2019 05/05/2019  WBC 4.0 - 10.5 K/uL 4.1 4.0 3.6(L)  Hemoglobin 12.0 - 15.0 g/dL 11.2(L) 11.2(L) 10.8(L)  Hematocrit 36 - 46 % 32.5(L) 33.2(L) 31.7(L)  Platelets 150 - 400 K/uL 186 178 179     CMP Latest Ref Rng & Units 06/16/2019 05/26/2019 05/05/2019  Glucose 70 - 99 mg/dL 87 74 88  BUN 6 - 20 mg/dL _0 Creatinine 0.44 - 1.00 mg/dL 0.86 0.78 0.83  Sodium 135 - 145 mmol/L 143 142 145  Potassium 3.5 - 5.1 mmol/L 3.0(LL) 3.6 2.7(LL)  Chloride 98 - 111 mmol/L 108 107 107  CO2 22 - 32 mmol/L _1 Calcium 8.9 - 10.3 mg/dL 9.2 8.9 8.6(L)  Total Protein 6.5 - 8.1 g/dL 6.2(L) 6.3(L) 6.0(L)  Total Bilirubin 0.3 - 1.2 mg/dL 1.8(H) 1.8(H) 1.8(H)  Alkaline Phos 38 - 126 U/L 58 58 59   AST 15 - 41 U/L 12(L) 15 14(L)  ALT 0 - 44 U/L _0 RADIOGRAPHIC STUDIES: I have personally reviewed the radiological images as listed and agreed with the findings in the report. No results found.   ASSESSMENT & PLAN:  DOMANIQUE HUESMAN is a 54 y.o. 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 She had right mastectomy, 4 cycles of AC-T and 5 years of Tamoxifen/AI.  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 She has received Target RT. She had excellent response to First-line Docetaxel/Herceptin/Perjeta and AI but progressed on maintenance therapy. She had mixed response on second line Kadcyla, progressed on third-line Ibrance/Herceptin and poorly tolerated Enhertu.  -She triedTucatinib (TUKYSA)with low doseon 12/12/18 however due to N&V, poor appetite and HA she stopped on 12/17/18 after trying twice. -I started her on next lineivchemoEribulin on day 1, 8, every 21 days on 12/21/18, along with Herceptin every 3 weeks. -HerPET imagesfrom 03/21/19 showmixed response to therapy of bone metastasis(improvedL4 lesion, new pelvic bone lesions). No evidence of hypermetabolic soft tissue metastasis.  -Istarted her next line treatmentwithNeratinib(Nerlynx) on 03/27/19 titration from 175m to 2455m Unfortunately she could not tolerate neratinib at dose of 16060mth significant nausea and diarrhea, and barely tolerated 120m86mse. She retried at 80mg45me daily Nerlynx starting on 05/08/19. She is tolerating it well now  -Due to high copay she was not able to getBudesonide. She did not take Colestid due to large size of pills. -We will continue Herceptin until thenewly approvedMargetuzimabbecomes available in our clinic, which I recommend.Ifshe is able to tolerateNeratinib, I will add on tamoxifen. -She continues to tolerate 80mg 69mlynx well now with occasional diarrhea, manageable. I discussed diarrhea usually improves after first month of treatment. I discussed increasing Nerlynx to 120mg t35me if she tolerates. She declined trying increased dose at this point.  -Given she is stable clinically with stable pain and improving tumor marker, I will post pone her next PET scan to July.  -Labs reviewed, and adequate to proceed with Herceptin today  -Continue to hold Nerlynx until this is discussed further with Dr. Feng.  Burr Medicons are to repeat a PET scan in 3 weeks     2. Congenital deafness  3.Mild anemia, secondary to chemotherapy and malignancy -She will start with oral iron daily for 1-2 months and then stop. She will also start daily women's vitamin. -Stable   4.Hypokalemia, Hypomagnesium, secondary toDiarrhea -She developed diarrhea s/p cycle 3 Perjeta.Has been d/c. Now diarrhea due to Nerlynx. -She is onOral potassium 10meq3 63ms a day. She stopped oral Mag because it made her nauseous. -Her K is 2.9 today (06/16/19). She will receive an extra oral 40meq he10moday and increase to 4 tabs daily.  -Her Mg is stable at 1.5 today.  5. Neutropenia, secondary to chemo -A CBC returned with a WBC of 3.7 with an ANC of 2.[place].  6. Mildhyperbilirubinemia -Bilirubin stable at 1.7 -likely second to chemotherapy, fluctuates  -Will monitor closely  7. Headaches and dizziness  -Her headaches and dizziness have abated since holding Nerlynx. She will continue to hold this until this can be discussed with Dr. Feng. SheBurr Medicostatus post a head CT from 06/23/2019 with results as follows:  FINDINGS: Brain: Normal cerebral volume. There is mild chronic white matter hypodensity near the right frontal horn and subjacent to the insula which appears stable from the  prior exams. Gray-white matter differentiation elsewhere is within normal limits. No midline shift, ventriculomegaly, mass effect, evidence of mass lesion,  intracranial hemorrhage or evidence of cortically based acute infarction. No abnormal enhancement identified.  Vascular: The major dural venous stretch that the major intracranial vascular structures are enhancing as expected.  Skull: Stable since 2017 and normal.  Sinuses/Orbits: Visualized paranasal sinuses and mastoids are clear.  Other: Visualized orbits and scalp soft tissues are within normal limits.  IMPRESSION: 1. No acute intracranial abnormality or metastatic diseaseidentified. 2. Chronic white matter disease in the right hemisphere is stable from prior exams.   PLAN: -Labs reviewed and adequate to proceed with Herceptin today  -Continue to hold Nerlynx until this can be reviewed with Dr. Burr Medico -Lab, flush, F/u and Herceptin in 3 weeks, will order restaging PET at that time -She is fine to proceed with traveling to [facility] in July.    No problem-specific Assessment & Plan notes found for this encounter.   No orders of the defined types were placed in this encounter.     Harle Stanford, [name] 07/07/2019

## 2019-07-07 NOTE — Progress Notes (Signed)
Patient requested to take their home 10 mEq potassium chloride pills instead of the 20 mEq potassium chloride pills from the [facility]. Verified that this would be okay with Sandi Mealy, [name]. Received the okay from [facility] and patient took the 40 mEq from home medication.

## 2019-07-07 NOTE — Progress Notes (Signed)
Patient seen by Van Tanner, PA.    

## 2019-07-07 NOTE — Patient Instructions (Signed)

## 2019-07-07 NOTE — Telephone Encounter (Signed)
Received critical result on pt of K+ 2.9.  Reported to [employer] PA.

## 2019-07-07 NOTE — Patient Instructions (Signed)
[facility] Discharge Instructions for Patients Receiving Chemotherapy  Today you received the following chemotherapy agents: Ogivri and Zometa  To help prevent nausea and vomiting after your treatment, we encourage you to take your nausea medication as prescribed.   If you develop nausea and vomiting that is not controlled by your nausea medication, call the clinic.   BELOW ARE SYMPTOMS THAT SHOULD BE REPORTED IMMEDIATELY:  *FEVER GREATER THAN 100.5 F  *CHILLS WITH OR WITHOUT FEVER  NAUSEA AND VOMITING THAT IS NOT CONTROLLED WITH YOUR NAUSEA MEDICATION  *UNUSUAL SHORTNESS OF BREATH  *UNUSUAL BRUISING OR BLEEDING  TENDERNESS IN MOUTH AND THROAT WITH OR WITHOUT PRESENCE OF ULCERS  *URINARY PROBLEMS  *BOWEL PROBLEMS  UNUSUAL RASH Items with * indicate a potential emergency and should be followed up as soon as possible.  Feel free to call the clinic should you have any questions or concerns. The clinic phone number is [phone].  Please show the [facility] at check-in to the Emergency Department and triage nurse.

## 2019-07-07 NOTE — Progress Notes (Signed)
OK to treat pending chemistry panel.  Sandi Mealy, MHS, [name]

## 2019-07-08 LAB — CANCER ANTIGEN 27.29: CA 27.29: 28.9 U/mL (ref 0.0–38.6)

## 2019-07-20 ENCOUNTER — Telehealth: Payer: Self-pay

## 2019-07-20 ENCOUNTER — Other Ambulatory Visit: Payer: Self-pay

## 2019-07-20 DIAGNOSIS — C50911 Malignant neoplasm of unspecified site of right female breast: Secondary | ICD-10-CM

## 2019-07-20 DIAGNOSIS — R112 Nausea with vomiting, unspecified: Secondary | ICD-10-CM

## 2019-07-20 NOTE — Telephone Encounter (Signed)
Ms Scully left vm stating she is "very sick, I have an itch, I am coughing and vomiting for 2 days. I want an appt with Dr Burr Medico for tomorrow."  I called Emmanuelle back and let her know Dr Burr Medico does not have any open appts tomorrow.  Aki has been scheduled to be evaluated is [facility].  Jaziya verbalized understanding.  Claiborne Billings from [facility] and confirmed an interpreter will be available tomorrow.

## 2019-07-21 ENCOUNTER — Encounter: Payer: Self-pay | Admitting: Medical

## 2019-07-21 ENCOUNTER — Ambulatory Visit (HOSPITAL_COMMUNITY)
Admission: RE | Admit: 2019-07-21 | Discharge: 2019-07-21 | Disposition: A | Discharge status: Home / Self Care | Payer: Medicaid Other | Source: Ambulatory Visit | Attending: Medical | Admitting: Medical

## 2019-07-21 ENCOUNTER — Inpatient Hospital Stay: Payer: Medicaid Other | Attending: Hematology | Admitting: Medical

## 2019-07-21 ENCOUNTER — Inpatient Hospital Stay: Payer: Medicaid Other

## 2019-07-21 ENCOUNTER — Other Ambulatory Visit: Payer: Self-pay

## 2019-07-21 VITALS — BP 132/73 | HR 99 | Temp 99.5°F | Resp 18 | Ht 65.0 in | Wt 127.1 lb

## 2019-07-21 DIAGNOSIS — R05 Cough: Secondary | ICD-10-CM | POA: Insufficient documentation

## 2019-07-21 DIAGNOSIS — R059 Cough, unspecified: Secondary | ICD-10-CM

## 2019-07-21 DIAGNOSIS — E876 Hypokalemia: Secondary | ICD-10-CM | POA: Diagnosis not present

## 2019-07-21 DIAGNOSIS — D6481 Anemia due to antineoplastic chemotherapy: Secondary | ICD-10-CM | POA: Insufficient documentation

## 2019-07-21 DIAGNOSIS — C7951 Secondary malignant neoplasm of bone: Secondary | ICD-10-CM | POA: Insufficient documentation

## 2019-07-21 DIAGNOSIS — G4700 Insomnia, unspecified: Secondary | ICD-10-CM | POA: Insufficient documentation

## 2019-07-21 DIAGNOSIS — R110 Nausea: Secondary | ICD-10-CM | POA: Diagnosis not present

## 2019-07-21 DIAGNOSIS — Z170 Estrogen receptor positive status [ER+]: Secondary | ICD-10-CM | POA: Diagnosis not present

## 2019-07-21 DIAGNOSIS — Z5112 Encounter for antineoplastic immunotherapy: Secondary | ICD-10-CM | POA: Insufficient documentation

## 2019-07-21 DIAGNOSIS — H905 Unspecified sensorineural hearing loss: Secondary | ICD-10-CM | POA: Insufficient documentation

## 2019-07-21 DIAGNOSIS — C50911 Malignant neoplasm of unspecified site of right female breast: Secondary | ICD-10-CM | POA: Diagnosis present

## 2019-07-21 DIAGNOSIS — R112 Nausea with vomiting, unspecified: Secondary | ICD-10-CM

## 2019-07-21 DIAGNOSIS — G893 Neoplasm related pain (acute) (chronic): Secondary | ICD-10-CM

## 2019-07-21 DIAGNOSIS — Z95828 Presence of other vascular implants and grafts: Secondary | ICD-10-CM

## 2019-07-21 LAB — CBC WITH DIFFERENTIAL (CANCER CENTER ONLY)
Abs Immature Granulocytes: 0.01 10*3/uL (ref 0.00–0.07)
Basophils Absolute: 0 10*3/uL (ref 0.0–0.1)
Basophils Relative: 0 %
Eosinophils Absolute: 0 10*3/uL (ref 0.0–0.5)
Eosinophils Relative: 0 %
HCT: 36 % (ref 36.0–46.0)
Hemoglobin: 12.2 g/dL (ref 12.0–15.0)
Immature Granulocytes: 0 %
Lymphocytes Relative: 16 %
Lymphs Abs: 1 10*3/uL (ref 0.7–4.0)
MCH: 29.3 pg (ref 26.0–34.0)
MCHC: 33.9 g/dL (ref 30.0–36.0)
MCV: 86.3 fL (ref 80.0–100.0)
Monocytes Absolute: 0.4 10*3/uL (ref 0.1–1.0)
Monocytes Relative: 7 %
Neutro Abs: 4.7 10*3/uL (ref 1.7–7.7)
Neutrophils Relative %: 77 %
Platelet Count: 197 10*3/uL (ref 150–400)
RBC: 4.17 MIL/uL (ref 3.87–5.11)
RDW: 13.2 % (ref 11.5–15.5)
WBC Count: 6.2 10*3/uL (ref 4.0–10.5)
nRBC: 0 % (ref 0.0–0.2)

## 2019-07-21 LAB — CMP (CANCER CENTER ONLY)
ALT: 10 U/L (ref 0–44)
AST: 13 U/L — ABNORMAL LOW (ref 15–41)
Albumin: 3.7 g/dL (ref 3.5–5.0)
Alkaline Phosphatase: 66 U/L (ref 38–126)
Anion gap: 14 (ref 5–15)
BUN: 11 mg/dL (ref 6–20)
CO2: 20 mmol/L — ABNORMAL LOW (ref 22–32)
Calcium: 9.3 mg/dL (ref 8.9–10.3)
Chloride: 107 mmol/L (ref 98–111)
Creatinine: 1.19 mg/dL — ABNORMAL HIGH (ref 0.44–1.00)
GFR, Est AFR Am: >60 mL/min (ref >60)
GFR, Estimated: 52 mL/min — ABNORMAL LOW (ref >60)
Glucose, Bld: 118 mg/dL — ABNORMAL HIGH (ref 70–99)
Potassium: 2.8 mmol/L — CL (ref 3.5–5.1)
Sodium: 141 mmol/L (ref 135–145)
Total Bilirubin: 1.3 mg/dL — ABNORMAL HIGH (ref 0.3–1.2)
Total Protein: 6.8 g/dL (ref 6.5–8.1)

## 2019-07-21 LAB — MAGNESIUM: Magnesium: 1.6 mg/dL — ABNORMAL LOW (ref 1.7–2.4)

## 2019-07-21 MED ORDER — ONDANSETRON HCL 4 MG/2ML IJ SOLN
8.0000 mg | Freq: Once | INTRAMUSCULAR | Status: AC
  Administered 2019-07-21: 8 mg via INTRAVENOUS

## 2019-07-21 MED ORDER — MORPHINE SULFATE (PF) 2 MG/ML IV SOLN
INTRAVENOUS | Status: AC
  Filled 2019-07-21: qty 1

## 2019-07-21 MED ORDER — MORPHINE SULFATE (PF) 2 MG/ML IV SOLN
1.0000 mg | Freq: Once | INTRAVENOUS | Status: AC
  Administered 2019-07-21: 1 mg via INTRAVENOUS

## 2019-07-21 MED ORDER — POTASSIUM CHLORIDE IN NACL 20-0.9 MEQ/L-% IV SOLN
Freq: Once | INTRAVENOUS | Status: AC
  Filled 2019-07-21: qty 1000

## 2019-07-21 MED ORDER — MORPHINE SULFATE (PF) 2 MG/ML IV SOLN
2.0000 mg | Freq: Once | INTRAVENOUS | Status: AC
  Administered 2019-07-21: 2 mg via INTRAVENOUS

## 2019-07-21 MED ORDER — SODIUM CHLORIDE 0.9% FLUSH
10.0000 mL | Freq: Once | INTRAVENOUS | Status: AC
  Administered 2019-07-21: 10 mL
  Filled 2019-07-21: qty 10

## 2019-07-21 MED ORDER — GUAIFENESIN-CODEINE 100-10 MG/5ML PO SOLN: 5.0000 mL | ORAL | 0 refills | Status: DC | PRN

## 2019-07-21 MED ORDER — ONDANSETRON HCL 4 MG/2ML IJ SOLN
INTRAMUSCULAR | Status: AC
  Filled 2019-07-21: qty 4

## 2019-07-21 MED ORDER — SODIUM CHLORIDE 0.9 % IV SOLN
INTRAVENOUS | Status: DC
  Filled 2019-07-21: qty 1000

## 2019-07-21 NOTE — Progress Notes (Signed)
Symptoms Management Clinic Progress Note   [name] [id number] 1965-07-27 54 y.o.  [name] is managed by Dr. Burr Medico.  Actively treated with chemotherapy/immunotherapy/hormonal therapy: yes  Current therapy: Zometa q27months; Herceptin q3weeks; Neratinib  Last treated: 06 / 25 / 2021  Next scheduled appointment with provider: 07 / 16/ 2021  Assessment: Plan:    Nausea without vomiting - Plan: DISCONTINUED: ondansetron (ZOFRAN) injection 8 mg  Hypokalemia - Plan: DISCONTINUED: sodium chloride 0.9 % 1,000 mL with potassium chloride 20 mEq infusion  Neoplasm related pain - Plan: DISCONTINUED: morphine 2 MG/ML injection 2 mg  Cough - Plan: guaiFENesin-codeine 100-10 MG/5ML syrup, DG Chest 2 View  Nausea without vomiting: Patient is not currently vomiting but continues to complain of nausea. She was given 8mg  zofran IV in the infusion room. She was encouraged to continue to take her prescribed zofran at home.   Hypokalemia: Potassium was reported to be 2.8. Patient was given sodium chloride 0.9% 1,015mL with potassium chloride 37mEq infusion over 2 hours in the infusion room.  Neoplasm related pain: Patient was given 2mg  morphine to help with pain. Patient was advised to continue taking 1-2 percocet every 4-6 hours at home for pain.   Cough: A chest x-ray returned negative.  The patient was given a prescription for Robitussin with codeine.  Please see After Visit Summary for patient specific instructions.  Future Appointments  Date Time Provider [place]  07/28/2019  7:45 AM [facility] LAB 4 [facility] None  07/28/2019  8:00 AM [facility] [place] None  07/28/2019  8:20 AM Truitt Merle, MD [facility] None  07/28/2019  9:00 AM [facility] INFUSION [facility] None    Orders Placed This Encounter  Procedures  . DG Chest 2 View       Subjective:   Patient ID:  [name] is a 54 y.o. (DOB Nov 25, 1965) female.  Chief Complaint:    Chief Complaint  Patient presents with  . Nausea    HPI [name] is a 54 year old female patient with a diagnosis of metastatic breast cancer, and she is followed by Dr. Burr Medico. She presents to the symptom management clinic today with an interpretor complaining of 3 days of weakness, nausea, and vomiting. These symptoms came on suddenly on Tuesday, and her whole body has been aching. She feels very weak, especially when she stands up to walk around her house. She stopped taking her potassium and Neratinib medications on Tuesday when she began to feel sick. She states that she has been taking 1-2 percocet tablets for body pain about every 4 hours without relief. She has also seen no relief in nausea from her zofran. She endorses a dry cough that has been keeping her up at night and complains of intermittent headaches. She states she has an appetite but it has been hard to eat foods or drink many fluids since she feels sick. She denies any sick contacts and states she stays at home whenever she can. She denies any documented fevers, chest pain, shortness of breath, or abdominal pain.  The patient has declined to have a Covid vaccine and declines to be tested for COVID-19.  Medications: I have reviewed the patient's current medications.  Allergies:  Allergies  Allergen Reactions  . Promethazine Hcl Shortness Of Breath, Itching and Other (See Comments)    Skin redness, also  NOTE: Pt tolerates PO Compazine  . Chlorhexidine Itching  . Diphenhydramine Hcl Itching and Rash    Past Medical  History:  Diagnosis Date  . Abnormal Pap smear   . Anxiety   . Breast cancer ([place])   . Cancer ([place]) 1999   breast-s/p mastectomy, chemo, rad  . CIN I (cervical intraepithelial neoplasia I) 2003   by colpo  . Congenital deafness   . Deaf   . Depression   . Port-A-Cath in place 2017   for chemotherapy  . Radiation 07/12/15-07/26/15   left femur 30 Gy  . S/P bilateral oophorectomy     Past  Surgical History:  Procedure Laterality Date  . ABDOMINAL HYSTERECTOMY    . INTRAMEDULLARY (IM) NAIL INTERTROCHANTERIC Left 06/26/2015   Procedure: LEFT BIOMET LONG AFFIXS NAIL;  Surgeon: [name], MD;  Location: [facility];  Service: Orthopedics;  Laterality: Left;  . IR GENERIC HISTORICAL  08/06/2015   IR US GUIDE VASC ACCESS LEFT 08/06/2015 Aletta Edouard, MD [facility]  . IR GENERIC HISTORICAL  08/06/2015   IR FLUORO GUIDE CV LINE LEFT 08/06/2015 Aletta Edouard, MD [facility]  . IR GENERIC HISTORICAL  03/05/2016   IR CV LINE INJECTION 03/05/2016 Markus Daft, MD [facility]  . LEFT HEART CATH AND CORONARY ANGIOGRAPHY N/A 12/13/2017   Procedure: LEFT HEART CATH AND CORONARY ANGIOGRAPHY;  Surgeon: Leonie Man, MD;  Location: [facility];  Service: Cardiovascular;  Laterality: N/A;  . MASTECTOMY     right breast  . MASTECTOMY    . OVARIAN CYST REMOVAL    . RADIOLOGY WITH ANESTHESIA Left 06/25/2015   Procedure: MRI OF LEFT HIP WITH OR WITHOUT CONTRAST;  Surgeon: Medication Radiologist, MD;  Location: [facility];  Service: Radiology;  Laterality: Left;  DR. MCINTYRE/MRI  . TUBAL LIGATION      Family History  Problem Relation Age of Onset  . Hypertension Mother   . Seizures Mother   . Alzheimer's disease Maternal Uncle   . Pancreatitis Maternal Grandmother   . Heart attack Maternal Grandfather     Social History   Socioeconomic History  . Marital status: Married    Spouse name: Not on file  . Number of children: 2  . Years of education: Not on file  . Highest education level: Not on file  Occupational History  . Not on file  Tobacco Use  . Smoking status: Never Smoker  . Smokeless tobacco: Never Used  Substance and Sexual Activity  . Alcohol use: No  . Drug use: No  . Sexual activity: Yes    Birth control/protection: Surgical  Other Topics Concern  . Not on file  Social History Narrative  . Not on file   Social Determinants of Health   Financial Resource  Strain:   . Difficulty of Paying Living Expenses:   Food Insecurity:   . Worried About Charity fundraiser in the Last Year:   . Arboriculturist in the Last Year:   Transportation Needs:   . Film/video editor (Medical):   Marland Kitchen Lack of Transportation (Non-Medical):   Physical Activity:   . Days of Exercise per Week:   . Minutes of Exercise per Session:   Stress:   . Feeling of Stress :   Social Connections:   . Frequency of Communication with Friends and Family:   . Frequency of Social Gatherings with Friends and Family:   . Attends Religious Services:   . Active Member of Clubs or Organizations:   . Attends Archivist Meetings:   Marland Kitchen Marital Status:   Intimate Partner Violence:   .  Fear of Current or Ex-Partner:   . Emotionally Abused:   Marland Kitchen Physically Abused:   . Sexually Abused:     Past Medical History, Surgical history, Social history, and Family history were reviewed and updated as appropriate.   Please see review of systems for further details on the patient's review from today.   Review of Systems:  Review of Systems  Constitutional: Positive for appetite change, chills and fatigue. Negative for fever.  HENT: Negative for congestion, mouth sores and sore throat.   Eyes: Negative for discharge, redness and itching.  Respiratory: Positive for cough. Negative for chest tightness and shortness of breath.   Cardiovascular: Negative for chest pain, palpitations and leg swelling.  Gastrointestinal: Positive for diarrhea, nausea and vomiting. Negative for abdominal distention, abdominal pain, blood in stool and constipation.  Genitourinary: Negative for hematuria.  Musculoskeletal: Positive for myalgias.  Skin: Negative for pallor, rash and wound.  Neurological: Positive for headaches. Negative for dizziness.    Objective:   Physical Exam:  BP 132/73 (BP Location: Left Wrist, Patient Position: Sitting)   Pulse 99   Temp 99.5 F (37.5 C) (Temporal)   Resp 18    Ht 5\' 5"  (1.651 m)   Wt 127 lb 1.6 oz (57.7 kg)   SpO2 98%   BMI 21.15 kg/m  ECOG: 1  Physical Exam Constitutional:      General: She is not in acute distress.    Appearance: Normal appearance. She is not ill-appearing.  HENT:     Head: Normocephalic and atraumatic.  Eyes:     Conjunctiva/sclera: Conjunctivae normal.  Cardiovascular:     Rate and Rhythm: Normal rate and regular rhythm.     Pulses: Normal pulses.     Heart sounds: Normal heart sounds. No murmur heard.  No friction rub. No gallop.   Pulmonary:     Effort: Pulmonary effort is normal.     Breath sounds: Normal breath sounds.  Abdominal:     General: Abdomen is flat.     Palpations: Abdomen is soft.     Tenderness: There is abdominal tenderness.  Musculoskeletal:        General: Tenderness present. No swelling.     Cervical back: Normal range of motion and neck supple.     Right lower leg: No edema.     Left lower leg: No edema.  Skin:    General: Skin is warm and dry.  Neurological:     General: No focal deficit present.     Mental Status: She is alert and oriented to person, place, and time. Mental status is at baseline.  Psychiatric:        Mood and Affect: Mood normal.        Behavior: Behavior normal.        Thought Content: Thought content normal.        Judgment: Judgment normal.     Lab Review:     Component Value Date/Time   NA 141 07/21/2019 1115   NA 141 01/08/2017 0910   K 2.8 (LL) 07/21/2019 1115   K 3.3 (L) 01/08/2017 0910   CL 107 07/21/2019 1115   CL 106 05/31/2012 1055   CO2 20 (L) 07/21/2019 1115   CO2 26 01/08/2017 0910   GLUCOSE 118 (H) 07/21/2019 1115   GLUCOSE 78 01/08/2017 0910   GLUCOSE 88 05/31/2012 1055   BUN 11 07/21/2019 1115   BUN 10.1 01/08/2017 0910   CREATININE 1.19 (H) 07/21/2019 1115   CREATININE 0.9  01/08/2017 0910   CALCIUM 9.3 07/21/2019 1115   CALCIUM 9.2 01/08/2017 0910   PROT 6.8 07/21/2019 1115   PROT 7.1 01/08/2017 0910   ALBUMIN 3.7 07/21/2019  1115   ALBUMIN 3.4 (L) 01/08/2017 0910   AST 13 (L) 07/21/2019 1115   AST 21 01/08/2017 0910   ALT 10 07/21/2019 1115   ALT 17 01/08/2017 0910   ALKPHOS 66 07/21/2019 1115   ALKPHOS 64 01/08/2017 0910   BILITOT 1.3 (H) 07/21/2019 1115   BILITOT 0.95 01/08/2017 0910   GFRNONAA 52 (L) 07/21/2019 1115   GFRAA >60 07/21/2019 1115       Component Value Date/Time   WBC 6.2 07/21/2019 1050   WBC 1.5 (L) 01/02/2019 2332   RBC 4.17 07/21/2019 1050   HGB 12.2 07/21/2019 1050   HGB 11.9 01/08/2017 0909   HCT 36.0 07/21/2019 1050   HCT 34.8 01/08/2017 0909   PLT 197 07/21/2019 1050   PLT 155 01/08/2017 0909   PLT 217 07/24/2014 1604   MCV 86.3 07/21/2019 1050   MCV 85.2 01/08/2017 0909   MCH 29.3 07/21/2019 1050   MCHC 33.9 07/21/2019 1050   RDW 13.2 07/21/2019 1050   RDW 13.5 01/08/2017 0909   LYMPHSABS 1.0 07/21/2019 1050   LYMPHSABS 2.0 01/08/2017 0909   MONOABS 0.4 07/21/2019 1050   MONOABS 0.3 01/08/2017 0909   EOSABS 0.0 07/21/2019 1050   EOSABS 0.1 01/08/2017 0909   EOSABS 0.1 07/24/2014 1604   BASOSABS 0.0 07/21/2019 1050   BASOSABS 0.0 01/08/2017 0909   -------------------------------  Imaging from last 24 hours (if applicable):  Radiology interpretation: DG Chest 2 View  Result Date: 07/21/2019 CLINICAL DATA:  Cough EXAM: CHEST - 2 VIEW COMPARISON:  10/12/2018 FINDINGS: There is a well-positioned left-sided Port-A-Cath. There is no acute cardiopulmonary process. The lungs are essentially clear. There is no pneumothorax. No large pleural effusion. There is some elevation of the left hemidiaphragm, stable from prior study. IMPRESSION: No acute cardiopulmonary process. Electronically Signed   By: Constance Holster M.D.   On: 07/21/2019 15:39   CT Head W Wo Contrast  Result Date: 06/23/2019 CLINICAL DATA:  54 year old female with breast cancer. Dizziness and headache for 2 weeks. Chemotherapy in progress. EXAM: CT HEAD WITHOUT AND WITH CONTRAST TECHNIQUE: Contiguous  axial images were obtained from the base of the skull through the vertex without and with intravenous contrast CONTRAST:  68mL OMNIPAQUE IOHEXOL 300 MG/ML  SOLN COMPARISON:  Head CT 09/18/2015.  Brain MRI 05/10/2011. FINDINGS: Brain: Normal cerebral volume. There is mild chronic white matter hypodensity near the right frontal horn and subjacent to the insula which appears stable from the prior exams. Gray-white matter differentiation elsewhere is within normal limits. No midline shift, ventriculomegaly, mass effect, evidence of mass lesion, intracranial hemorrhage or evidence of cortically based acute infarction. No abnormal enhancement identified. Vascular: The major dural venous stretch that the major intracranial vascular structures are enhancing as expected. Skull: Stable since 2017 and normal. Sinuses/Orbits: Visualized paranasal sinuses and mastoids are clear. Other: Visualized orbits and scalp soft tissues are within normal limits. IMPRESSION: 1. No acute intracranial abnormality or metastatic disease identified. 2. Chronic white matter disease in the right hemisphere is stable from prior exams. Electronically Signed   By: Genevie Ann M.D.   On: 06/23/2019 23:21        This case was discussed with Dr. Burr Medico. She expressed agreement with my management of this patient.

## 2019-07-21 NOTE — Patient Instructions (Signed)

## 2019-07-21 NOTE — Progress Notes (Signed)
Critical lab received at 1145: Potassium 2.8 PA Lucianne Lei made aware.

## 2019-07-21 NOTE — Progress Notes (Signed)
Chest x-ray was negative.

## 2019-07-21 NOTE — Patient Instructions (Addendum)
Potassium chloride injection What is this medicine? POTASSIUM CHLORIDE (poe TASS i um KLOOR ide) is a potassium supplement used to prevent and to treat low potassium. Potassium is important for the heart, muscles, and nerves. Too much or too little potassium in the body can cause serious problems. This medicine may be used for other purposes; ask your health care provider or pharmacist if you have questions. COMMON BRAND NAME(S): PROAMP What should I tell my health care provider before I take this medicine? They need to know if you have any of these conditions:  Addison's disease  dehydration  diabetes  heart disease  high levels of potassium in the blood  irregular heartbeat  kidney disease  recent severe burn  an unusual or allergic reaction to potassium, other medicines, foods, dyes, or preservatives  pregnant or trying to get pregnant  breast-feeding How should I use this medicine? This medicine is for infusion into a vein. It is given by a health care professional in a hospital or clinic setting. Talk to your pediatrician regarding the use of this medicine in children. Special care may be needed. Overdosage: If you think you have taken too much of this medicine contact a poison control center or emergency room at once. NOTE: This medicine is only for you. Do not share this medicine with others. What if I miss a dose? This does not apply. What may interact with this medicine? Do not take this medicine with any of the following medications:  certain diuretics such as spironolactone, triamterene  eplerenone  sodium polystyrene sulfonate This medicine may also interact with the following medications:  certain medicines for blood pressure or heart disease like lisinopril, losartan, quinapril, valsartan  medicines that lower your chance of fighting infection such as cyclosporine, tacrolimus  NSAIDs, medicines for pain and inflammation, like ibuprofen or  naproxen  other potassium supplements  salt substitutes This list may not describe all possible interactions. Give your health care provider a list of all the medicines, herbs, non-prescription drugs, or dietary supplements you use. Also tell them if you smoke, drink alcohol, or use illegal drugs. Some items may interact with your medicine. What should I watch for while using this medicine? Your condition will be monitored carefully while you are receiving this medicine. You may need blood work done while you are taking this medicine. What side effects may I notice from receiving this medicine? Side effects that you should report to your doctor or health care professional as soon as possible:  allergic reactions like skin rash, itching or hives, swelling of the face, lips, or tongue  breathing problems  confusion  fast, irregular heartbeat  feeling faint or lightheaded, falls  low blood pressure  numbness or tingling in hands or feet  pain, redness, or irritation at site where injected  unusually weak or tired  weakness, heaviness of legs Side effects that usually do not require medical attention (report to your doctor or health care professional if they continue or are bothersome):  diarrhea  nausea, vomiting  stomach pain This list may not describe all possible side effects. Call your doctor for medical advice about side effects. You may report side effects to FDA at 1-800-FDA-1088. Where should I keep my medicine? This drug is given in a hospital or clinic and will not be stored at home. NOTE: This sheet is a summary. It may not cover all possible information. If you have questions about this medicine, talk to your doctor, pharmacist, or health care provider.    2020 [employer] Standard (2015-10-02 13:59:20)  

## 2019-07-26 ENCOUNTER — Other Ambulatory Visit: Payer: Self-pay

## 2019-07-26 DIAGNOSIS — C7951 Secondary malignant neoplasm of bone: Secondary | ICD-10-CM

## 2019-07-26 NOTE — Progress Notes (Signed)
[place]   [phone] [phone]   Clinic Follow up Note   Patient Care Team: [name], [name], [name] as PCP - General (Physician Assistant) [name], MD as Consulting Physician (Orthopedic Surgery)  Date of Service:  07/28/2019  CHIEF COMPLAINT: F/u ofmetastatic breast cancer  SUMMARY OF ONCOLOGIC HISTORY: Oncology History Overview Note   Cancer Staging No matching staging information was found for the patient.     [name], right ([place])  11/1997 Cancer Diagnosis   Patient had 1.4 cm poorly differentiated right breast carcinoma diagnosed in Nov 1999 at age 54, 1/7 axillary nodes involved, ER/PR and HER 2 positive. She had mastectomy with the 7 axillary node evaluation, 4 cycles of adriamycin/ cytoxan followed by taxotere, then five years of tamoxifen thru June 2005 (no herceptin in 1999). She had bilateral oophorectomy in May 2005. She was briefly on aromatase inhibitor after tamoxifen, but discontinued this herself due to poor tolerance.   04/04/2015 Genetic Testing   Negative genetic testing on the breast/ovarian cancer pnael.  The Breast/Ovarian gene panel offered by [facility] includes sequencing and rearrangement analysis for the following 20 genes:  ATM, BARD1, BRCA1, BRCA2, BRIP1, CDH1, CHEK2, EPCAM, FANCC, MLH1, MSH2, MSH6, NBN, PALB2, PMS2, PTEN, RAD51C, RAD51D, TP53, and XRCC2.   06/26/2015 Pathology Results   Initial Path Report Bone, curettage, Left femur met, intramedullary subtroch tissue - METASTATIC ADENOCARCINOMA.  Estrogen Receptor: 95%, POSITIVE, STRONG STAINING INTENSITY Progesterone Receptor: 2%, POSITIVE, STRONG  HER2 (+), IHC 3+   06/26/2015 Surgery   Biopsy of metastatic tissue and stabilization with Affixus trochanteric nail, proximal and distal interlock of left femur by Dr. [name]    06/28/2015 Imaging   CT Chest w/ Contrast 1. Extensive right internal mammary chain  lymphadenopathy consistent with metastatic breast cancer. 2. Lytic metastases involving the posterior elements at T1 with probable nondisplaced pathologic fracture of the spinous process. 3. No other evidence of thoracic metastatic disease. 4. Trace bilateral pleural effusions with associated bibasilar atelectasis. 5. Indeterminate right thyroid nodule, not previously imaged. This could be evaluated with thyroid ultrasound as clinically warranted.   06/29/2015 -  Chemotherapy   Zometa started monthly on 06/29/15 and switched to q88month from 01/08/17   07/01/2015 Imaging   Bone Scan 1. Increased activity noted throughout the left femur. Although metastatic disease cannot be excluded, these changes are most likely secondary to prior surgery. 2. No other focal abnormalities identified to suggest metastatic disease.   07/12/2015 - 07/26/2015 Radiation Therapy   Left femur, 30 Gy in 10 fractions by Dr. [name]   07/14/2015 Initial Diagnosis   [name], right ([place]   07/19/2015 Imaging   Initial PET Scan 1. Severe multifocal osseous metastatic disease, much greater than anticipated based on prior imaging. 2. Multifocal nodal metastases involving the right internal mammary, prevascular and subpectoral lymph nodes. No axillary pulmonary involvement identified. 3. No distant extra osseous metastases.   07/30/2015 - 11/29/2015 Chemotherapy   Docetaxel, Herceptin and Perjeta every 3 weeks X6 cycles, pt tolerated moderately well, restaging scan showed excellent response    09/18/2015 Imaging   CT Head w/wo Contrast 1. No acute intracranial abnormality or significant interval change. 2. Stable minimal periventricular white matter hypoattenuation on the right. This may reflect a remote ischemic injury. 3. No evidence for metastatic disease to the brain. 4. No focal soft tissue lesion to explain the patient's tenderness.   10/30/2015 Imaging   CT  Abdomen Pelvis w/  Contrast 1. Few mildly prominent fluid-filled loops of small bowel scattered throughout the abdomen, with intraluminal fluid density within the distal colon. Given the provided history, findings are suggestive of acute enteritis/diarrheal illness. 2. No other acute intra-abdominal or pelvic process identified. 3. Widespread osseous metastatic disease, better evaluated on most recent PET-CT from 07/19/2015. No pathologic fracture or other complication. 4. 11 mm hypodensity within the left kidney. This lesion measures intermediate density, and is indeterminate. While this lesion is similar in size relative to recent studies, this is increased in size relative to prior study from 2012. Further evaluation with dedicated renal mass protocol CT and/or MRI is recommended for complete characterization.   12/2015 - 05/14/2016 Chemotherapy   Maintenance Herceptin and pejeta every 3 weeks stopped on 05/14/16 due to disease progression   12/25/2015 Imaging   Restaging PET Scan 1. Markedly improved skeletal activity, with only several faint foci of residual accentuated metabolic activity, but resolution of the vast majority of the previously extensive osseous metastatic disease. 2. Resolution of the prior right internal mammary and right prevascular lymph nodes. 3. Residual subcutaneous edema and edema along fascia planes in the left upper thigh. This remains somewhat more than I would expect for placement of an IM nail 6 weeks ago. If there is leg swelling further down, consider Doppler venous ultrasound to rule out left lower extremity DVT. I do not perceive an obvious difference in density between the pelvic and common femoral veins on the noncontrast CT data. 4. Chronic right maxillary and right sphenoid sinusitis.   01/16/2016 - 10/20/2017 Anti-estrogen oral therapy   Letrozole 2.5 mg daily. She was switched to exemestane due to disease progression on 02/08/17. Stopped on 10/20/2017 when treatment changed to chemo     03/01/2016 Miscellaneous   Patient presented to ED following a fall; she reports she landed on her back and is now experiencing back pain. The patient was evaluated and discharge home same day with pain control.   05/01/2016 Imaging   CT CAP IMPRESSION: 1. Stable exam.  No new or progressive disease identified. 2. No evidence for residual or recurrent adenopathy within the chest. 3. Stable bone metastasis.   05/01/2016 Imaging   BONE SCAN IMPRESSION: 1. Small focal uptake involving the anterior rib ends of the left second and right fourth ribs, new since the prior bone scan. The location of this uptake is more suggestive of a traumatic or inflammatory etiology as opposed to metastatic disease. No other evidence of metastatic disease. 2. There are other areas of stable uptake which are most likely degenerative/reactive, including right shoulder and cervical spine uptake and uptake along the right femur adjacent to the ORIF hardware.   05/20/2016 Imaging   NM PET Skull to Thigh IMPRESSION: 1. There multiple metastatic foci in the bony pelvis, including some new abnormal foci compare to the prior PET-CT, compatible with mildly progressive bony metastatic disease. Also, a left T11 vertebral hypermetabolic metastatic lesion is observed, new compared to the prior exam. 2. No active extraosseous malignancy is currently identified. 3. Right anterior fourth rib healing fracture, appears be   06/04/2016 - 01/08/2017 Chemotherapy   Second line chemotherapy Kadcyla every 3 weeks started on 06/04/16 and stopped on 01/08/17 due to mixed response.     10/05/2016 Imaging   CT CAP and Bone Scan:  Bones: left intramedullary rod and left femoral head neck screw in place. In the proximal diaphysis of the left femur there is cortical thickening and irregularity.  No lytic or blastic bone lesions. No fracture or vertebral endplate destruction.   No definite evidence of recurrent disease in the  chest, abdomen, or pelvis.   10/28/2016 Imaging   CT A/P IMPRESSION: No acute process demonstrated in the abdomen or pelvis. No evidence of bowel obstruction or inflammation.   11/04/2016 Imaging   DG foot complete left: IMPRESSION: No fracture or dislocation.  No soft tissue abnormality   11/23/2016 Imaging   CT RIGHT FEMUR IMPRESSION: Negative CT scan of the right thigh.  No visible metastatic disease.  CT LEFT FEMUR IMPRESSION: 1. Postsurgical changes related to prior cephalomedullary rod fixation of the left femur with linear lucency along the anterolateral proximal femoral cortex at level of the lesser trochanter, suspicious for nondisplaced fracture. 2. Slightly permeative appearance of the proximal left femoral cortex at the site of known prior osseous metastases is largely unchanged. 3. Unchanged patchy lucency and sclerosis along the anterior acetabulum, which appears to correspond with an area of faint uptake on the prior PET-CT, suspicious for osseous metastasis. These results will be called to the ordering clinician or representative by the Radiologist Assistant, and communication documented in the PACS or zVision Dashboard.     01/27/2017 Imaging   IMPRESSION: 1. Mixed response to osseous metastasis. Some hypermetabolic lesions are new and increasingly hypermetabolic, while another index lesion is decreasingly hypermetabolic. 2. No evidence of hypermetabolic soft tissue metastasis.    02/08/2017 - 10/19/2017 Antibody Plan   -Herceptin every 3 week since 02/08/17 -Oral Lanpantinib 1549m and decreased to 5077mdue to diarrhea and depression. Due to disease progression she swithced to Verzenio 15057mn 05/04/17. She did not toelrate well so she was switched to Ibrance 100m57m 05/31/17. Due to her neutropenia, Ibrance dose reduced to 75 mg daily, 3 weeks on, one-week off, stopped on 10/20/2017 due to disease progression.    04/30/2017 PET scan   IMPRESSION: 1.  Primarily increase in hypermetabolism of osseous metastasis. An isolated right acetabular lesion is decreased in hypermetabolism since the prior exam. 2. No evidence of hypermetabolic soft tissue metastasis. 3. Similar non FDG avid right-sided thyroid nodule, favoring a benign etiology. Recommend attention on follow-up.   08/30/2017 PET scan   08/30/2017 PET Scan  IMPRESSION: 1. Worsening osseous metastatic disease. 2. Focal hypermetabolism in the central spinal canal the L1 level, similar to the prior exam. Metastatic implant cannot be excluded. 3. Slight enlargement of a right thyroid nodule which now appears more cystic in character. Consider further evaluation with thyroid ultrasound. If patient is clinically hyperthyroid, consider nuclear medicine thyroid uptake and scan   08/30/2017 Progression   08/30/2017 PET Scan  IMPRESSION: 1. Worsening osseous metastatic disease. 2. Focal hypermetabolism in the central spinal canal the L1 level, similar to the prior exam. Metastatic implant cannot be excluded. 3. Slight enlargement of a right thyroid nodule which now appears more cystic in character. Consider further evaluation with thyroid ultrasound. If patient is clinically hyperthyroid, consider nuclear medicine thyroid uptake and scan   09/29/2017 - 10/11/2017 Radiation Therapy   Pt received 27 Gy in 9 fractions directed at the areas of significant uptake noted on recent bone scan the right pelvis and right proximal femur, with Dr, KinaSondra Come10/09/2017 - 04/07/2018 Chemotherapy   -Herceptin every 3 weeks starting 02/08/17, perjeta added on 10/20/2017  -weekly Taxol started on 10/21/2017. Will reduce Taxol to 2 weeks on, 1 week off starting on 02/04/17.  Stoped due to mild disease progression   10/27/2017  Imaging   10/27/2017 CT AP IMPRESSION: 1. No acute abnormality. No evidence of bowel obstruction or acute bowel inflammation. Normal appendix. 2. Stable patchy sclerotic osseous  metastases throughout the visualized skeleton. No new or progressive metastatic disease in the abdomen or pelvis.   01/11/2018 Imaging   Whole Body Bone Scan 01/11/18  IMPRESSION: 1. Multifocal osseous metastases as described. 2. Right scapular metastasis. 3. Right superior thoracic spinous process. 4. Right sacral ala and right L5 metastases. 5. Right acetabular metastasis. 6. Right proximal femur metastasis. 7. Diffuse metastases throughout the left femur. 8. Anterior right fourth rib metastasis.   01/11/2018 Imaging   CT CAP W Contrast 01/11/18  IMPRESSION: 1. Similar-appearing osseous metastatic disease. 2. No evidence for progressed metastatic disease in the chest, abdomen or pelvis.   03/30/2018 Imaging   Bone Scan 03/30/18  IMPRESSION: Stable multifocal bony metastatic disease.   03/30/2018 Imaging    CT CAP W Contrast 03/30/18 IMPRESSION: 1. New mild contiguous wall thickening with associated mucosal hyperenhancement throughout the left colon and rectum, suggesting nonspecific infectious or inflammatory proctocolitis. Differential includes C diff colitis. 2. Stable faint patchy sclerosis in the iliac bones and lumbar vertebral bodies. No new or progressive osseous metastatic disease by CT. Please note that the osseous lesions are relatively occult by CT and would be better evaluated by PET-CT, as clinically warranted. 3. No evidence of extra osseous metastatic disease.  These results will be called to the ordering clinician or representative by the Radiologist Assistant, and communication documented in the PACS or zVision Dashboard.   04/15/2018 - 06/16/2018 Chemotherapy   Enhertu q3weeks starting 04/15/18. Stopped Enhertu after cycle 3 on 05/27/18 due to poor toleration due to poor toleration with anorexia, weight loss, N&V, diarrhea   04/21/2018 PET scan   PET 04/21/18  IMPRESSION: 1. Overall marked interval improvement in bony hypermetabolic disease. All of the  previously identified hypermetabolic osseous metastases have decreased in hypermetabolism in the interval. There is a new tiny focus of hypermetabolism in the right aspect of the L3 vertebral body that appears to correspond to a new 8 mm lucency, raising concern for new metastatic disease. Close attention on follow-up recommended. 2. Focal hypermetabolism in the central spinal canal at the level of L1 previously has resolved. 3. No hypermetabolic soft tissue disease on today's study. 4. Right thyroid nodule seen previously has decreased in the interval.    06/16/2018 Imaging   CT LUMBAR SPINE WO CONTRAST 06/16/18 IMPRESSION: 1. Transitional lumbosacral anatomy. Correlation with radiographs is recommended prior to any operative intervention. 2. Suspicion of a right L3 vertebral body metastasis on the April PET-CT is not evident by CT. Stable degenerative versus metastatic lesion at the right L5 superior endplate. Small but increased right iliac bone metastasis since March. Lumbar MRI would best characterize lumbar metastatic disease (without contrast if necessary). 3. Suspect L2-L3 left subarticular segment disc herniation. Query left L2 radiculitis. 4. Stable appearance of rightward L4-L5 disc degeneration which could affect the right foramen and right lateral recess.   06/17/2018 - 11/16/2018 Chemotherapy   Restart Taxol 2 weeks on/1 week off with Herceptin and Perjeta q3weeks on 06/17/18. Perjeta was stopped after C5 due to diarrhea. Stopped Taxol after 11/16/18 due to disease progression to L3.    07/06/2018 PET scan   PET  IMPRESSION: 1. Near total resolution of hypermetabolic activity associated with skeletal metastasis. Very mild activity associated with the LEFT iliac bone which is similar to background blood pool activity. 2. No new or  progressive disease.   11/21/2018 PET scan   IMPRESSION: 1. New focal hypermetabolic lesion eccentric to the right in the L3 vertebral body,  maximum SUV 6.1 compatible with a new focus of active malignancy. 2. Very low-grade activity similar to blood pool in previous existing mildly sclerotic metastatic lesions including the left iliac bone lesion. Some of the mildly sclerotic lesions are not hypermetabolic compatible with effectively treated bony metastatic disease. 3. No hypermetabolic soft tissue lesions are identified.     11/30/2018 -  Chemotherapy   Continue Herceptin q3weeks    12/12/2018 - 12/17/2018 Chemotherapy   Tucatinib (TUKYSA) 6 tabs BID as a next line therapy starting 12/12/18. Stopped 12/17/18 duet o N&V, poor appetite and HA    12/21/2018 - 03/17/2019 Chemotherapy   IV Eribulin on day 1, 8 every 21 days starting 12/21/18. Stopped 03/17/19 due to mixed response to treatment.    03/21/2019 PET scan   IMPRESSION: 1. Mixed response to therapy of osseous metastasis. Although a right-sided L4 lesion demonstrates decreased hypermetabolism today, new left iliac and right ischial hypermetabolic lesions are seen. 2. No evidence of hypermetabolic soft tissue metastasis.     03/27/2019 -  Chemotherapy   Neratinib (Nerlynx) titrate up from 155m to 2427mstarting 03/27/19. She could not tolerate 16033mnd stopped on 02/05/19 due to N&V and diarrhea. Will restart at 120m77m 04/10/18-04/27/19 and 4/19-4/22. Reduced to 80mg62me daily on 05/10/19   06/23/2019 Imaging   CT Head  IMPRESSION: 1. No acute intracranial abnormality or metastatic disease identified. 2. Chronic white matter disease in the right hemisphere is stable from prior exams.        CURRENT THERAPY:  -Zometa q3mont55monthtinue Herceptin q3weeks -Neratinib (Nerlynx)titrate up from 120mg t64m0mg st20mng3/15/21.She could not tolerate160mgand 92mped on 02/05/19 due to N&V and diarrhea. Will restart at 120mg on 3103m20-4/15/21and4/19-4/22. Reducedto 80mg once 71my on 05/10/19. Held since 07/21/19 due to dry cough, Nausea and body aches.   INTERVAL  HISTORY:  [name] a follow up and ongoing treatment. She presents to the clinic with her interpretor. She has not been taking her chemo medication since 07/21/19. She notes she has been using Vicks vapo rub and has not been taking all her oral potassium. She notes she still has dry cough. She notes mild blood when she coughed up vomit. She is not sure if this is from vomit of coughing. She notes she has not been eating as much. Her body aches that she had last week are better and she is walking better. She has been taking oxycodone more last week but since her pain improved she was able to decrease it, now 2 tabs daily. She notes she did not like the taste of cough syrup was not good but did help her. Since being off her Nerlynx she has not had diarrhea and her headaches are less.  [place] [facility] for 3 weeks. She requested a letter to allow her to travel on plane given her metal rod in leg.     REVIEW OF SYSTEMS:   Constitutional: Denies fevers, chills or abnormal weight loss Eyes: Denies blurriness of vision Ears, nose, mouth, throat, and face: Denies mucositis or sore throat Respiratory: Denies dyspnea or wheezes (+) Dry cough  Cardiovascular: Denies palpitation, chest discomfort or lower extremity swelling Gastrointestinal:  Denies nausea, heartburn or change in bowel habits Skin: Denies abnormal skin rashes MSK: (+) improved body  pain  Lymphatics: Denies new lymphadenopathy or easy bruising Neurological:Denies numbness, tingling or new weaknesses Behavioral/Psych: Mood is stable, no new changes  All other systems were reviewed with the patient and are negative.  MEDICAL HISTORY:  Past Medical History:  Diagnosis Date  . Abnormal Pap smear   . Anxiety   . Breast cancer ([place])   . Cancer ([place]) 1999   breast-s/p mastectomy, chemo, rad  . CIN I (cervical intraepithelial neoplasia I) 2003   by colpo  . Congenital  deafness   . Deaf   . Depression   . Port-A-Cath in place 2017   for chemotherapy  . Radiation 07/12/15-07/26/15   left femur 30 Gy  . S/P bilateral oophorectomy     SURGICAL HISTORY: Past Surgical History:  Procedure Laterality Date  . ABDOMINAL HYSTERECTOMY    . INTRAMEDULLARY (IM) NAIL INTERTROCHANTERIC Left 06/26/2015   Procedure: LEFT BIOMET LONG AFFIXS NAIL;  Surgeon: [name], MD;  Location: [facility];  Service: Orthopedics;  Laterality: Left;  . IR GENERIC HISTORICAL  08/06/2015   IR US GUIDE VASC ACCESS LEFT 08/06/2015 Aletta Edouard, MD [facility]  . IR GENERIC HISTORICAL  08/06/2015   IR FLUORO GUIDE CV LINE LEFT 08/06/2015 Aletta Edouard, MD [facility]  . IR GENERIC HISTORICAL  03/05/2016   IR CV LINE INJECTION 03/05/2016 Markus Daft, MD [facility]  . LEFT HEART CATH AND CORONARY ANGIOGRAPHY N/A 12/13/2017   Procedure: LEFT HEART CATH AND CORONARY ANGIOGRAPHY;  Surgeon: Leonie Man, MD;  Location: [facility];  Service: Cardiovascular;  Laterality: N/A;  . MASTECTOMY     right breast  . MASTECTOMY    . OVARIAN CYST REMOVAL    . RADIOLOGY WITH ANESTHESIA Left 06/25/2015   Procedure: MRI OF LEFT HIP WITH OR WITHOUT CONTRAST;  Surgeon: Medication Radiologist, MD;  Location: [facility];  Service: Radiology;  Laterality: Left;  DR. MCINTYRE/MRI  . TUBAL LIGATION      I have reviewed the social history and family history with the patient and they are unchanged from previous note.  ALLERGIES:  is allergic to promethazine hcl, chlorhexidine, and diphenhydramine hcl.  MEDICATIONS:  Current Outpatient Medications  Medication Sig Dispense Refill  . ALPRAZolam (XANAX) 0.25 MG tablet Take 1 tablet (0.25 mg total) by mouth 2 (two) times daily as needed for anxiety. 30 tablet 0  . diphenoxylate-atropine (LOMOTIL) 2.5-0.025 MG tablet Take 1-2 tablets by mouth 4 (four) times daily as needed for diarrhea or loose stools. 60 tablet 1  . guaiFENesin-codeine 100-10 MG/5ML syrup  Take 5 mLs by mouth every 4 (four) hours as needed for cough. 180 mL 0  . lidocaine-prilocaine (EMLA) cream Apply 1 application topically as needed. Apply to Porta-Cath 1-2 hours prior to access as directed. 90 g 2  . Neratinib Maleate (NERLYNX) 40 MG tablet Take 2 tablets (80 mg total) by mouth daily. Take with food. 60 tablet 0  . ondansetron (ZOFRAN) 8 MG tablet Take 1 tablet (8 mg total) by mouth every 8 (eight) hours as needed for nausea or vomiting. 45 tablet 1  . oxyCODONE (ROXICODONE) 5 MG immediate release tablet Take 1 tablet (5 mg total) by mouth every 8 (eight) hours as needed for severe pain. 60 tablet 0  . potassium chloride (KLOR-CON) 10 MEQ tablet Take 3 tablets (30 mEq total) by mouth daily. 90 tablet 2  . prochlorperazine (COMPAZINE) 10 MG tablet Take 1 tablet (10 mg total) by mouth 2 (two) times daily as needed  for nausea or vomiting. 10 tablet 0  . zolpidem (AMBIEN) 5 MG tablet Take 1-2 tablets (5-10 mg total) by mouth at bedtime as needed. 60 tablet 0   No current facility-administered medications for this visit.   Facility-Administered Medications Ordered in Other Visits  Medication Dose Route Frequency Provider Last Rate Last Admin  . heparin lock flush 100 unit/mL  500 Units Intracatheter Once PRN Truitt Merle, MD      . loratadine (CLARITIN) tablet 10 mg  10 mg Oral Daily Truitt Merle, MD   10 mg at 07/28/19 0957  . potassium chloride 10 mEq in 100 mL IVPB  10 mEq Intravenous Q1 Hr x 2 Truitt Merle, MD      . potassium chloride SA (K-DUR) CR tablet 40 mEq  40 mEq Oral Once Truitt Merle, MD      . sodium chloride 0.9 % 1,000 mL with potassium chloride 10 mEq infusion   Intravenous Continuous Gordy Levan, MD   Stopped at 09/10/15 1653  . sodium chloride 0.9 % injection 10 mL  10 mL Intravenous PRN Livesay, Lennis P, MD      . sodium chloride flush (NS) 0.9 % injection 10 mL  10 mL Intracatheter PRN Truitt Merle, MD      . trastuzumab-dkst (OGIVRI) 378 mg in sodium chloride 0.9 % 250  mL chemo infusion  6 mg/kg (Treatment Plan Recorded) Intravenous Once Truitt Merle, MD        PHYSICAL EXAMINATION: ECOG PERFORMANCE STATUS: 2 - Symptomatic, <50% confined to bed  Vitals:   07/28/19 0829  BP: 121/76  Pulse: 71  Resp: 16  Temp: (!) 97.5 F (36.4 C)  SpO2: 100%   Filed Weights   07/28/19 0829  Weight: 129 lb 4.8 oz (58.7 kg)    GENERAL:alert, no distress and comfortable SKIN: skin color, texture, turgor are normal, no rashes or significant lesions EYES: normal, Conjunctiva are pink and non-injected, sclera clear  NECK: supple, thyroid normal size, non-tender, without nodularity LYMPH:  no palpable lymphadenopathy in the cervical, axillary  LUNGS: clear to auscultation and percussion with normal breathing effort HEART: regular rate & rhythm and no murmurs and no lower extremity edema ABDOMEN:abdomen soft, non-tender and normal bowel sounds Musculoskeletal:no cyanosis of digits and no clubbing  NEURO: alert & oriented x 3 with fluent speech, no focal motor/sensory deficits  LABORATORY DATA:  I have reviewed the data as listed CBC Latest Ref Rng & Units 07/28/2019 07/21/2019 07/07/2019  WBC 4.0 - 10.5 K/uL 3.3(L) 6.2 3.7(L)  Hemoglobin 12.0 - 15.0 g/dL 10.7(L) 12.2 11.1(L)  Hematocrit 36 - 46 % 30.8(L) 36.0 32.6(L)  Platelets 150 - 400 K/uL 203 197 186     CMP Latest Ref Rng & Units 07/28/2019 07/21/2019 07/07/2019  Glucose 70 - 99 mg/dL 110(H) 118(H) 101(H)  BUN 6 - 20 mg/dL _0 Creatinine 0.44 - 1.00 mg/dL 0.83 1.19(H) 0.97  Sodium 135 - 145 mmol/L 144 141 145  Potassium 3.5 - 5.1 mmol/L 2.8(LL) 2.8(LL) 2.9(LL)  Chloride 98 - 111 mmol/L 108 107 107  CO2 22 - 32 mmol/L 25 20(L) 28  Calcium 8.9 - 10.3 mg/dL 9.2 9.3 9.2  Total Protein 6.5 - 8.1 g/dL 6.4(L) 6.8 6.1(L)  Total Bilirubin 0.3 - 1.2 mg/dL 1.2 1.3(H) 1.7(H)  Alkaline Phos 38 - 126 U/L 57 66 60  AST 15 - 41 U/L 14(L) 13(L) 12(L)  ALT 0 - 44 U/L _1 RADIOGRAPHIC STUDIES:  I have  personally reviewed the radiological images as listed and agreed with the findings in the report. No results found.   ASSESSMENT & PLAN:  ZELTA ENFIELD is a 54 y.o. 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 She had right mastectomy, 4 cycles of AC-T and 5 years of Tamoxifen/AI.  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 She has received Target RT. -She had excellent response to First-line Docetaxel/Herceptin/Perjeta and AI but progressed on maintenance therapy. She had mixed response on second line Kadcyla, progressed on third-line Ibrance/Herceptin and poorly tolerated Enhertu.  -She poorly tolerated low dose Tucatinib (TUKYSA)12/12/18-12/17/18 after trying twice and had mixed response to IV Eribulin with Herceptin 12/21/18-03/17/19, after 5 cycles. -Istartedher next line treatmentwithNeratinib(Nerlynx)on 03/27/19 titration from 120m to 2419m Unfortunately she could not tolerate neratinib at dose of 16025mth significant nausea and diarrhea,and barely tolerated 120m81mse. She retriedat80mg80me dailyNerlynxstartingon 05/08/19.She is tolerating it well now and declined trying increased dose at this point.  -She continues Herceptin q3weeks.  -Nerlynx was held since 07/21/19 due to dry cough, weakness, nausea, and vomiting at the time. Her pain and weakness has improved, but still has dry cough. Her chest Xray from 07/21/19 showed No acute cardiopulmonary process.  -Labs reviewed, WBC 3.3, Hg 10.7, K 2.8, BG 110,  Mag 1.6. Ca 27.29 still pending. Overall adequate to proceed with Herceptin today.  -She notes off Nerlynx her headaches and diarrhea has improved. She is hesitant to restart it. I discussed getting PET scan and re-evaluate treatment based on results. She is agreeable.  -Will continue to hold Nerlynx.  -F/u in 1-2 weeks with scan  results.    2. N&V, Diarrhea -previouslysecondary to Eribulin, but now secondary to Neratinib  -She tolerated 120mg 82m well but 160mg s34mtopped after 3 days due to N&V and liquid diarrhea hourly. -She notes Imodium did not help. She could not afford $1300 Budesonide. She has not taken the alternativeColestid becausethe pills are too large for her to swallow. -Sheinitially hadmanageable side effects on 120mg do53meratinibbut N/V/Drecurred with stool incontinence. Her diarrhea has become occasional now on 80mg onc36mily Nerlynx, will continue management with lomotil as needed.  -She felt better off Nerlynx since holding for recent cough, nausea and body aches. Her diarrhea and headaches improved.    3.Body pain, secondary to bone mets, Hypocalcemia, leg cramps  -Since her cancer diagnosis,she has had b/l hip, shoulder and chest painat different time points, likely related to her bone mets -She declined Gabapentin given risk of suicidal thoughts. -She is being treated with Zometa q3months. 9months have hypocalcemia with leg cramps secondary to Zometa.  -She will continue oral calcium and Vit Dand Prilosec for Acid Reflux. -Current pain includes, lower back, left upper legand recently her right leg andrightshoulder. Her PET from 03/21/19 does indicate disease progression in her b/l pelvic lesions. -Pain is currently controlled on 5mg Oxycod47m once daily. -With recent dry cough she had nausea and body pain. This has improved this week on cough syrup and off chemo.   4.Congenital deafness  5.Mild anemia, secondary to chemotherapy and malignancy -She will start with oral iron daily for 1-2 months and then stop. She will also start daily women's vitamin. -Stable   6.Hypokalemia, Hypomagnesium, secondary toDiarrhea -She developed diarrhea s/p cycle 3 Perjeta.Has been d/c. Now diarrhea due to Nerlynx. -She is  onOral potassium 34mq3 times a day.She  stopped oral Mag because it made her nauseous. -Her K is 2.8 today (07/28/19). Mag at 1.6. She will receive IV or take mote oral potassium in clinic today    7.Goal of care discussion -she understand her goal of care is palliative to prolong her life -she is full code for now  8.Insomnia -She cannot take Benadryl due to allergy reaction. She has tried trazodone with no relief.  -She is on Xanax and Ambien 1-2 tabs nightly.I advised her to not take them at the same time.   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 She declined using ice bags using infusion -stable. Will watch on chemo.  10. Neutropenia, secondary to chemo -Seen after C4D1 with ANC 0.4 on 03/07/19 -She has been on Onpro on day 8 which has not been enough. I recommend Granix injections the day after chemo, she is apprehensive due to needles and frequent visits.  -Due to mixed response,Eribulinwas d/c. -Has been mild on Nerlynx, stable.   172 Mildhyperbilirubinemia -likely second to chemotherapy, fluctuates  -Will monitor closely -Resolved today, but on the high end of normal (07/28/19).   12. Headaches and dizziness  -She notes in the past month she has had intermittent moderate headaches along with feeling mildly off balance walking occasionally. She relates this to vertigo.  -06/23/19 CT head showed No acute intracranial abnormality or metastatic disease identified. -Headaches did improve since holding Nerlynx.  13. Dry Cough -Nerlynx was held since 07/21/19 due to dry cough, weakness, nausea, and vomiting at the time. Her body pain and weakness has improved, but still has dry cough. She notes 1 episode of mild blood production but not sure if this was from cough or vomit.  -Her chest Xray from 07/21/19 showed No acute cardiopulmonary process. -She was offered COVID19 testing, she declined. She has also declined getting vaccine.  -She is on liquid cough syrup which has helped. She does not like the taste  so I encouraged her to dilate with other liquid.  -[facility] I discussed I do not recommend she travel with these symptoms especially since she is not vaccinated. She will think about it.  -Lungs are clear on exam today (07/28/19).    PLAN: -lab reviewed, will proceed with Herceptin today and give iv KCL 225m and oral KCL 2063mtoday -I refilled her oxycodone today  -Continue to hold Nerlynx -Labs reviewed, adequate to proceed with Herceptin -PET in 1-2 weeks with f/u 1-2 days after scan  -I discouraged her to travel next Monday, she will think about it  -repeat echo in next 2-3 weeks    No problem-specific Assessment & Plan notes found for this encounter.   Orders Placed This Encounter  Procedures  . NM PET Image Restag (PS) Skull Base To Thigh    Standing Status:   Future    Standing Expiration Date:   07/27/2020    Order Specific Question:   If indicated for the ordered procedure, I authorize the administration of a radiopharmaceutical per Radiology protocol    Answer:   Yes    Order Specific Question:   Is the patient pregnant?    Answer:   No    Order Specific Question:   Preferred imaging location?    Answer[facility] Order Specific Question:   Radiology Contrast Protocol - do NOT remove file path    Answer:   [email or web address]  .  ECHOCARDIOGRAM COMPLETE    Standing Status:   Future    Standing Expiration Date:   07/27/2020    Order Specific Question:   Where should this test be performed    Answer:   [place]    Order Specific Question:   Perflutren DEFINITY (image enhancing agent) should be administered unless hypersensitivity or allergy exist    Answer:   Administer Perflutren    Order Specific Question:   Is a special reader required? (athlete or structural heart)    Answer:   No    Order Specific Question:   Does this study need to be read by the Structural team/Level 3 readers?     Answer:   No    Order Specific Question:   Reason for exam-Echo    Answer:   Chemotherapy evaluation  v87.41 / v58.11   All questions were answered. The patient knows to call the clinic with any problems, questions or concerns. No barriers to learning was detected. The total time spent in the appointment was 30 minutes.     Truitt Merle, MD 07/28/2019   I, Joslyn Devon, am acting as scribe for Truitt Merle, MD.   I have reviewed the above documentation for accuracy and completeness, and I agree with the above.

## 2019-07-28 ENCOUNTER — Other Ambulatory Visit: Payer: Self-pay

## 2019-07-28 ENCOUNTER — Encounter: Payer: Self-pay | Admitting: Hematology

## 2019-07-28 ENCOUNTER — Other Ambulatory Visit: Payer: Self-pay | Admitting: Hematology

## 2019-07-28 ENCOUNTER — Inpatient Hospital Stay (HOSPITAL_BASED_OUTPATIENT_CLINIC_OR_DEPARTMENT_OTHER): Payer: Medicaid Other | Admitting: Hematology

## 2019-07-28 ENCOUNTER — Telehealth: Payer: Self-pay

## 2019-07-28 ENCOUNTER — Inpatient Hospital Stay: Payer: Medicaid Other

## 2019-07-28 VITALS — BP 121/76 | HR 71 | Temp 97.5°F | Resp 16 | Ht 65.0 in | Wt 129.3 lb

## 2019-07-28 DIAGNOSIS — C7951 Secondary malignant neoplasm of bone: Secondary | ICD-10-CM

## 2019-07-28 DIAGNOSIS — C50911 Malignant neoplasm of unspecified site of right female breast: Secondary | ICD-10-CM | POA: Diagnosis not present

## 2019-07-28 DIAGNOSIS — E876 Hypokalemia: Secondary | ICD-10-CM

## 2019-07-28 DIAGNOSIS — C799 Secondary malignant neoplasm of unspecified site: Secondary | ICD-10-CM

## 2019-07-28 DIAGNOSIS — Z95828 Presence of other vascular implants and grafts: Secondary | ICD-10-CM

## 2019-07-28 DIAGNOSIS — Z5112 Encounter for antineoplastic immunotherapy: Secondary | ICD-10-CM | POA: Diagnosis not present

## 2019-07-28 LAB — CMP (CANCER CENTER ONLY)
ALT: 10 U/L (ref 0–44)
AST: 14 U/L — ABNORMAL LOW (ref 15–41)
Albumin: 3.4 g/dL — ABNORMAL LOW (ref 3.5–5.0)
Alkaline Phosphatase: 57 U/L (ref 38–126)
Anion gap: 11 (ref 5–15)
BUN: 8 mg/dL (ref 6–20)
CO2: 25 mmol/L (ref 22–32)
Calcium: 9.2 mg/dL (ref 8.9–10.3)
Chloride: 108 mmol/L (ref 98–111)
Creatinine: 0.83 mg/dL (ref 0.44–1.00)
GFR, Est AFR Am: >60 mL/min (ref >60)
GFR, Estimated: >60 mL/min (ref >60)
Glucose, Bld: 110 mg/dL — ABNORMAL HIGH (ref 70–99)
Potassium: 2.8 mmol/L — CL (ref 3.5–5.1)
Sodium: 144 mmol/L (ref 135–145)
Total Bilirubin: 1.2 mg/dL (ref 0.3–1.2)
Total Protein: 6.4 g/dL — ABNORMAL LOW (ref 6.5–8.1)

## 2019-07-28 LAB — CBC WITH DIFFERENTIAL (CANCER CENTER ONLY)
Abs Immature Granulocytes: 0.02 10*3/uL (ref 0.00–0.07)
Basophils Absolute: 0 10*3/uL (ref 0.0–0.1)
Basophils Relative: 0 %
Eosinophils Absolute: 0.1 10*3/uL (ref 0.0–0.5)
Eosinophils Relative: 2 %
HCT: 30.8 % — ABNORMAL LOW (ref 36.0–46.0)
Hemoglobin: 10.7 g/dL — ABNORMAL LOW (ref 12.0–15.0)
Immature Granulocytes: 1 %
Lymphocytes Relative: 37 %
Lymphs Abs: 1.2 10*3/uL (ref 0.7–4.0)
MCH: 30.1 pg (ref 26.0–34.0)
MCHC: 34.7 g/dL (ref 30.0–36.0)
MCV: 86.8 fL (ref 80.0–100.0)
Monocytes Absolute: 0.3 10*3/uL (ref 0.1–1.0)
Monocytes Relative: 10 %
Neutro Abs: 1.7 10*3/uL (ref 1.7–7.7)
Neutrophils Relative %: 50 %
Platelet Count: 203 10*3/uL (ref 150–400)
RBC: 3.55 MIL/uL — ABNORMAL LOW (ref 3.87–5.11)
RDW: 12.4 % (ref 11.5–15.5)
WBC Count: 3.3 10*3/uL — ABNORMAL LOW (ref 4.0–10.5)
nRBC: 0 % (ref 0.0–0.2)

## 2019-07-28 LAB — MAGNESIUM: Magnesium: 1.6 mg/dL — ABNORMAL LOW (ref 1.7–2.4)

## 2019-07-28 MED ORDER — OXYCODONE HCL 5 MG PO TABS: 5.0000 mg | ORAL_TABLET | Freq: Three times a day (TID) | ORAL | 0 refills | Status: DC | PRN

## 2019-07-28 MED ORDER — PALONOSETRON HCL INJECTION 0.25 MG/5ML
0.2500 mg | Freq: Once | INTRAVENOUS | Status: AC
  Administered 2019-07-28: 0.25 mg via INTRAVENOUS

## 2019-07-28 MED ORDER — ACETAMINOPHEN 325 MG PO TABS
ORAL_TABLET | ORAL | Status: AC
  Filled 2019-07-28: qty 2

## 2019-07-28 MED ORDER — HEPARIN SOD (PORK) LOCK FLUSH 100 UNIT/ML IV SOLN
500.0000 [IU] | Freq: Once | INTRAVENOUS | Status: AC | PRN
  Administered 2019-07-28: 500 [IU]
  Filled 2019-07-28: qty 5

## 2019-07-28 MED ORDER — POTASSIUM CHLORIDE 10 MEQ/100ML IV SOLN
10.0000 meq | INTRAVENOUS | Status: AC
  Administered 2019-07-28 (×2): 10 meq via INTRAVENOUS

## 2019-07-28 MED ORDER — LORATADINE 10 MG PO TABS
ORAL_TABLET | ORAL | Status: AC
  Filled 2019-07-28: qty 1

## 2019-07-28 MED ORDER — ACETAMINOPHEN 325 MG PO TABS
650.0000 mg | ORAL_TABLET | Freq: Once | ORAL | Status: AC
  Administered 2019-07-28: 650 mg via ORAL

## 2019-07-28 MED ORDER — POTASSIUM CHLORIDE 10 MEQ/100ML IV SOLN
INTRAVENOUS | Status: AC
  Filled 2019-07-28: qty 100

## 2019-07-28 MED ORDER — SODIUM CHLORIDE 0.9% FLUSH
10.0000 mL | INTRAVENOUS | Status: DC | PRN
  Administered 2019-07-28: 10 mL
  Filled 2019-07-28: qty 10

## 2019-07-28 MED ORDER — LORATADINE 10 MG PO TABS
10.0000 mg | ORAL_TABLET | Freq: Every day | ORAL | Status: DC
  Administered 2019-07-28: 10 mg via ORAL

## 2019-07-28 MED ORDER — PALONOSETRON HCL INJECTION 0.25 MG/5ML
INTRAVENOUS | Status: AC
  Filled 2019-07-28: qty 5

## 2019-07-28 MED ORDER — SODIUM CHLORIDE 0.9% FLUSH
10.0000 mL | Freq: Once | INTRAVENOUS | Status: AC
  Administered 2019-07-28: 10 mL
  Filled 2019-07-28: qty 10

## 2019-07-28 MED ORDER — DEXAMETHASONE SODIUM PHOSPHATE 10 MG/ML IJ SOLN
INTRAMUSCULAR | Status: AC
  Filled 2019-07-28: qty 1

## 2019-07-28 MED ORDER — SODIUM CHLORIDE 0.9 % IV SOLN
Freq: Once | INTRAVENOUS | Status: AC
  Filled 2019-07-28: qty 250

## 2019-07-28 MED ORDER — POTASSIUM CHLORIDE CRYS ER 20 MEQ PO TBCR
20.0000 meq | EXTENDED_RELEASE_TABLET | Freq: Once | ORAL | Status: AC
  Administered 2019-07-28: 20 meq via ORAL

## 2019-07-28 MED ORDER — DEXAMETHASONE SODIUM PHOSPHATE 10 MG/ML IJ SOLN
5.0000 mg | Freq: Once | INTRAMUSCULAR | Status: AC
  Administered 2019-07-28: 5 mg via INTRAVENOUS

## 2019-07-28 MED ORDER — TRASTUZUMAB-DKST CHEMO 150 MG IV SOLR
6.0000 mg/kg | Freq: Once | INTRAVENOUS | Status: AC
  Administered 2019-07-28: 378 mg via INTRAVENOUS
  Filled 2019-07-28: qty 18

## 2019-07-28 NOTE — Telephone Encounter (Signed)
CRITICAL VALUE STICKER  CRITICAL VALUE: Potassium 2.8  RECEIVER (on-site recipient of call): Jobe Igo, [place] NOTIFIED: 07/28/19 @ 0854  MESSENGER (representative from lab): Pam  MD NOTIFIED: Ronnette Hila MD desk RN  TIME OF NOTIFICATION: 0900  RESPONSE: Tollie Pizza to address with MD.

## 2019-07-28 NOTE — Telephone Encounter (Signed)
Pt left vm stating there is a problem with her pain medication prescription.  She doesn't understand.  I called her twice. No one answered the phone and I was unable to leave a message.  I spoke with [facility] and they did fill a 5 day prescription for her.

## 2019-07-28 NOTE — Patient Instructions (Signed)
[name] Discharge Instructions for Patients Receiving Chemotherapy  Today you received the following immunotherapy agent: Trastuzumab  To help prevent nausea and vomiting after your treatment, we encourage you to take your nausea medication as directed by your MD.   If you develop nausea and vomiting that is not controlled by your nausea medication, call the clinic.   BELOW ARE SYMPTOMS THAT SHOULD BE REPORTED IMMEDIATELY:  *FEVER GREATER THAN 100.5 F  *CHILLS WITH OR WITHOUT FEVER  NAUSEA AND VOMITING THAT IS NOT CONTROLLED WITH YOUR NAUSEA MEDICATION  *UNUSUAL SHORTNESS OF BREATH  *UNUSUAL BRUISING OR BLEEDING  TENDERNESS IN MOUTH AND THROAT WITH OR WITHOUT PRESENCE OF ULCERS  *URINARY PROBLEMS  *BOWEL PROBLEMS  UNUSUAL RASH Items with * indicate a potential emergency and should be followed up as soon as possible.  Feel free to call the clinic should you have any questions or concerns. The clinic phone number is [phone].  Please show the [place] at check-in to the Emergency Department and triage nurse.   Hypokalemia Hypokalemia means that the amount of potassium in the blood is lower than normal. Potassium is a chemical (electrolyte) that helps regulate the amount of fluid in the body. It also stimulates muscle tightening (contraction) and helps nerves work properly. Normally, most of the body's potassium is inside cells, and only a very small amount is in the blood. Because the amount in the blood is so small, minor changes to potassium levels in the blood can be life-threatening. What are the causes? This condition may be caused by:  Antibiotic medicine.  Diarrhea or vomiting. Taking too much of a medicine that helps you have a bowel movement (laxative) can cause diarrhea and lead to hypokalemia.  Chronic kidney disease (CKD).  Medicines that help the body get rid of excess fluid (diuretics).  Eating disorders, such as  bulimia.  Low magnesium levels in the body.  Sweating a lot. What are the signs or symptoms? Symptoms of this condition include:  Weakness.  Constipation.  Fatigue.  Muscle cramps.  Mental confusion.  Skipped heartbeats or irregular heartbeat (palpitations).  Tingling or numbness. How is this diagnosed? This condition is diagnosed with a blood test. How is this treated? This condition may be treated by:  Taking potassium supplements by mouth.  Adjusting the medicines that you take.  Eating more foods that contain a lot of potassium. If your potassium level is very low, you may need to get potassium through an IV and be monitored in the hospital. Follow these instructions at home:   Take over-the-counter and prescription medicines only as told by your health care provider. This includes vitamins and supplements.  Eat a healthy diet. A healthy diet includes fresh fruits and vegetables, whole grains, healthy fats, and lean proteins.  If instructed, eat more foods that contain a lot of potassium. This includes: ? Nuts, such as peanuts and pistachios. ? Seeds, such as sunflower seeds and pumpkin seeds. ? Peas, lentils, and lima beans. ? Whole grain and bran cereals and breads. ? Fresh fruits and vegetables, such as apricots, avocado, bananas, cantaloupe, kiwi, oranges, tomatoes, asparagus, and potatoes. ? Orange juice. ? Tomato juice. ? Red meats. ? Yogurt.  Keep all follow-up visits as told by your health care provider. This is important. Contact a health care provider if you:  Have weakness that gets worse.  Feel your heart pounding or racing.  Vomit.  Have diarrhea.  Have diabetes (diabetes mellitus) and you have  trouble keeping your blood sugar (glucose) in your target range. Get help right away if you:  Have chest pain.  Have shortness of breath.  Have vomiting or diarrhea that lasts for more than 2 days.  Faint. Summary  Hypokalemia means that  the amount of potassium in the blood is lower than normal.  This condition is diagnosed with a blood test.  Hypokalemia may be treated by taking potassium supplements, adjusting the medicines that you take, or eating more foods that are high in potassium.  If your potassium level is very low, you may need to get potassium through an IV and be monitored in the hospital. This information is not intended to replace advice given to you by your health care provider. Make sure you discuss any questions you have with your health care provider. Document Revised: 08/11/2017 Document Reviewed: 08/11/2017 Elsevier Patient Education  [place].

## 2019-07-28 NOTE — Patient Instructions (Signed)

## 2019-07-28 NOTE — Progress Notes (Signed)
Datil for treatment with ECHO results from 03/20/19.

## 2019-07-29 LAB — CANCER ANTIGEN 27.29: CA 27.29: 27.1 U/mL (ref 0.0–38.6)

## 2019-07-31 ENCOUNTER — Encounter: Payer: Self-pay | Admitting: Hematology

## 2019-07-31 ENCOUNTER — Telehealth: Payer: Self-pay | Admitting: Hematology

## 2019-07-31 NOTE — Telephone Encounter (Signed)
Scheduled per 7/16 los. Unable to reach pt. Left message with interpreter with appt time and date.

## 2019-08-02 ENCOUNTER — Telehealth: Payer: Self-pay | Admitting: Hematology

## 2019-08-02 NOTE — Telephone Encounter (Signed)
R/s appt per 7/20 sch msg - unable to reach pt . Left message with interpreter on vmail.

## 2019-08-07 ENCOUNTER — Other Ambulatory Visit (HOSPITAL_COMMUNITY): Payer: Medicaid Other

## 2019-08-14 ENCOUNTER — Ambulatory Visit: Payer: Medicaid Other | Admitting: Hematology

## 2019-08-17 ENCOUNTER — Other Ambulatory Visit: Payer: Self-pay

## 2019-08-17 ENCOUNTER — Ambulatory Visit (HOSPITAL_COMMUNITY)
Admission: RE | Admit: 2019-08-17 | Discharge: 2019-08-17 | Disposition: A | Discharge status: Home / Self Care | Payer: Medicaid Other | Source: Ambulatory Visit | Attending: Hematology | Admitting: Hematology

## 2019-08-17 DIAGNOSIS — Z5181 Encounter for therapeutic drug level monitoring: Secondary | ICD-10-CM | POA: Diagnosis present

## 2019-08-17 DIAGNOSIS — C50911 Malignant neoplasm of unspecified site of right female breast: Secondary | ICD-10-CM | POA: Diagnosis not present

## 2019-08-17 DIAGNOSIS — C7951 Secondary malignant neoplasm of bone: Secondary | ICD-10-CM | POA: Insufficient documentation

## 2019-08-17 LAB — ECHOCARDIOGRAM COMPLETE
Area-P 1/2: 6.17 cm2
S' Lateral: 3.2 cm

## 2019-08-17 NOTE — Progress Notes (Signed)
  Echocardiogram 2D Echocardiogram has been performed.  [name] [name] 08/17/2019, 11:42 AM

## 2019-08-18 ENCOUNTER — Telehealth: Payer: Self-pay

## 2019-08-18 ENCOUNTER — Inpatient Hospital Stay: Payer: Medicaid Other | Admitting: Hematology

## 2019-08-18 NOTE — Telephone Encounter (Signed)
I attempted to contact [name] at 1027 to cancel appt for today as this was made to review PET scan results, however PET scan was never scheduled.  I have scheduled PET scan for 8/13 at 1200. No one answered the phone and I was unable to leave vm as mailbox was full.  I will try again later 1044 second attempt to contact patient.  1130  I was able to leave a vm canceling [name]'s appt today and the reasoning.  I left the date of her PET scan and instructions on her vm.Marland Kitchen

## 2019-08-22 ENCOUNTER — Telehealth: Payer: Self-pay | Admitting: Hematology

## 2019-08-22 NOTE — Telephone Encounter (Signed)
Scheduled appt per 8/10 sch msg - left message for patient with appt date and time

## 2019-08-25 ENCOUNTER — Inpatient Hospital Stay: Payer: Medicaid Other

## 2019-08-25 ENCOUNTER — Ambulatory Visit (HOSPITAL_COMMUNITY): Admission: RE | Admit: 2019-08-25 | Payer: Medicaid Other | Source: Ambulatory Visit

## 2019-08-25 ENCOUNTER — Other Ambulatory Visit: Payer: Self-pay

## 2019-08-25 ENCOUNTER — Inpatient Hospital Stay: Payer: Medicaid Other | Attending: Hematology

## 2019-08-25 VITALS — BP 115/77 | HR 73 | Temp 98.0°F | Resp 16 | Wt 134.0 lb

## 2019-08-25 DIAGNOSIS — Z170 Estrogen receptor positive status [ER+]: Secondary | ICD-10-CM | POA: Insufficient documentation

## 2019-08-25 DIAGNOSIS — G893 Neoplasm related pain (acute) (chronic): Secondary | ICD-10-CM | POA: Insufficient documentation

## 2019-08-25 DIAGNOSIS — C50911 Malignant neoplasm of unspecified site of right female breast: Secondary | ICD-10-CM | POA: Diagnosis present

## 2019-08-25 DIAGNOSIS — Z5112 Encounter for antineoplastic immunotherapy: Secondary | ICD-10-CM | POA: Insufficient documentation

## 2019-08-25 DIAGNOSIS — C7951 Secondary malignant neoplasm of bone: Secondary | ICD-10-CM

## 2019-08-25 DIAGNOSIS — D6481 Anemia due to antineoplastic chemotherapy: Secondary | ICD-10-CM | POA: Insufficient documentation

## 2019-08-25 DIAGNOSIS — E876 Hypokalemia: Secondary | ICD-10-CM | POA: Insufficient documentation

## 2019-08-25 DIAGNOSIS — R110 Nausea: Secondary | ICD-10-CM | POA: Insufficient documentation

## 2019-08-25 DIAGNOSIS — H905 Unspecified sensorineural hearing loss: Secondary | ICD-10-CM | POA: Diagnosis not present

## 2019-08-25 DIAGNOSIS — G4700 Insomnia, unspecified: Secondary | ICD-10-CM | POA: Insufficient documentation

## 2019-08-25 LAB — CMP (CANCER CENTER ONLY)
ALT: 6 U/L (ref 0–44)
AST: 9 U/L — ABNORMAL LOW (ref 15–41)
Albumin: 3.3 g/dL — ABNORMAL LOW (ref 3.5–5.0)
Alkaline Phosphatase: 63 U/L (ref 38–126)
Anion gap: 8 (ref 5–15)
BUN: 10 mg/dL (ref 6–20)
CO2: 26 mmol/L (ref 22–32)
Calcium: 9.4 mg/dL (ref 8.9–10.3)
Chloride: 107 mmol/L (ref 98–111)
Creatinine: 1.08 mg/dL — ABNORMAL HIGH (ref 0.44–1.00)
GFR, Est AFR Am: >60 mL/min (ref >60)
GFR, Estimated: 59 mL/min — ABNORMAL LOW (ref >60)
Glucose, Bld: 111 mg/dL — ABNORMAL HIGH (ref 70–99)
Potassium: 3.2 mmol/L — ABNORMAL LOW (ref 3.5–5.1)
Sodium: 141 mmol/L (ref 135–145)
Total Bilirubin: 1.5 mg/dL — ABNORMAL HIGH (ref 0.3–1.2)
Total Protein: 6.1 g/dL — ABNORMAL LOW (ref 6.5–8.1)

## 2019-08-25 LAB — MAGNESIUM: Magnesium: 1.8 mg/dL (ref 1.7–2.4)

## 2019-08-25 LAB — CBC WITH DIFFERENTIAL (CANCER CENTER ONLY)
Abs Immature Granulocytes: 0.02 10*3/uL (ref 0.00–0.07)
Basophils Absolute: 0 10*3/uL (ref 0.0–0.1)
Basophils Relative: 0 %
Eosinophils Absolute: 0.1 10*3/uL (ref 0.0–0.5)
Eosinophils Relative: 3 %
HCT: 30.8 % — ABNORMAL LOW (ref 36.0–46.0)
Hemoglobin: 10.4 g/dL — ABNORMAL LOW (ref 12.0–15.0)
Immature Granulocytes: 0 %
Lymphocytes Relative: 25 %
Lymphs Abs: 1.2 10*3/uL (ref 0.7–4.0)
MCH: 30.1 pg (ref 26.0–34.0)
MCHC: 33.8 g/dL (ref 30.0–36.0)
MCV: 89 fL (ref 80.0–100.0)
Monocytes Absolute: 0.5 10*3/uL (ref 0.1–1.0)
Monocytes Relative: 9 %
Neutro Abs: 3 10*3/uL (ref 1.7–7.7)
Neutrophils Relative %: 63 %
Platelet Count: 190 10*3/uL (ref 150–400)
RBC: 3.46 MIL/uL — ABNORMAL LOW (ref 3.87–5.11)
RDW: 13 % (ref 11.5–15.5)
WBC Count: 4.8 10*3/uL (ref 4.0–10.5)
nRBC: 0 % (ref 0.0–0.2)

## 2019-08-25 MED ORDER — PALONOSETRON HCL INJECTION 0.25 MG/5ML
0.2500 mg | Freq: Once | INTRAVENOUS | Status: AC
  Administered 2019-08-25: 0.25 mg via INTRAVENOUS

## 2019-08-25 MED ORDER — HEPARIN SOD (PORK) LOCK FLUSH 100 UNIT/ML IV SOLN
500.0000 [IU] | Freq: Once | INTRAVENOUS | Status: AC | PRN
  Administered 2019-08-25: 500 [IU]
  Filled 2019-08-25: qty 5

## 2019-08-25 MED ORDER — LORATADINE 10 MG PO TABS
10.0000 mg | ORAL_TABLET | Freq: Every day | ORAL | Status: DC
  Administered 2019-08-25: 10 mg via ORAL

## 2019-08-25 MED ORDER — PALONOSETRON HCL INJECTION 0.25 MG/5ML
INTRAVENOUS | Status: AC
  Filled 2019-08-25: qty 5

## 2019-08-25 MED ORDER — LORATADINE 10 MG PO TABS
ORAL_TABLET | ORAL | Status: AC
  Filled 2019-08-25: qty 1

## 2019-08-25 MED ORDER — SODIUM CHLORIDE 0.9% FLUSH
10.0000 mL | INTRAVENOUS | Status: DC | PRN
  Administered 2019-08-25: 10 mL
  Filled 2019-08-25: qty 10

## 2019-08-25 MED ORDER — ACETAMINOPHEN 325 MG PO TABS
650.0000 mg | ORAL_TABLET | Freq: Once | ORAL | Status: AC
  Administered 2019-08-25: 650 mg via ORAL

## 2019-08-25 MED ORDER — TRASTUZUMAB-DKST CHEMO 150 MG IV SOLR
6.0000 mg/kg | Freq: Once | INTRAVENOUS | Status: AC
  Administered 2019-08-25: 378 mg via INTRAVENOUS
  Filled 2019-08-25: qty 18

## 2019-08-25 MED ORDER — ACETAMINOPHEN 325 MG PO TABS
ORAL_TABLET | ORAL | Status: AC
  Filled 2019-08-25: qty 2

## 2019-08-25 MED ORDER — DEXAMETHASONE SODIUM PHOSPHATE 10 MG/ML IJ SOLN
INTRAMUSCULAR | Status: AC
  Filled 2019-08-25: qty 1

## 2019-08-25 MED ORDER — SODIUM CHLORIDE 0.9 % IV SOLN
Freq: Once | INTRAVENOUS | Status: AC
  Filled 2019-08-25: qty 250

## 2019-08-25 MED ORDER — DEXAMETHASONE SODIUM PHOSPHATE 10 MG/ML IJ SOLN
5.0000 mg | Freq: Once | INTRAMUSCULAR | Status: AC
  Administered 2019-08-25: 5 mg via INTRAVENOUS

## 2019-08-25 NOTE — Progress Notes (Signed)
Per Dr. Burr Medico, no labs required for today's treatment. Also, Dr. Burr Medico advised ok to treat with most recent echo results.

## 2019-08-25 NOTE — Patient Instructions (Signed)
[facility] Discharge Instructions for Patients Receiving Chemotherapy  Today you received the following chemotherapy agents trastuzumab.  To help prevent nausea and vomiting after your treatment, we encourage you to take your nausea medication as directed.    If you develop nausea and vomiting that is not controlled by your nausea medication, call the clinic.   BELOW ARE SYMPTOMS THAT SHOULD BE REPORTED IMMEDIATELY:  *FEVER GREATER THAN 100.5 F  *CHILLS WITH OR WITHOUT FEVER  NAUSEA AND VOMITING THAT IS NOT CONTROLLED WITH YOUR NAUSEA MEDICATION  *UNUSUAL SHORTNESS OF BREATH  *UNUSUAL BRUISING OR BLEEDING  TENDERNESS IN MOUTH AND THROAT WITH OR WITHOUT PRESENCE OF ULCERS  *URINARY PROBLEMS  *BOWEL PROBLEMS  UNUSUAL RASH Items with * indicate a potential emergency and should be followed up as soon as possible.  Feel free to call the clinic should you have any questions or concerns. The clinic phone number is [phone].  Please show the CHEMO ALERT CARD at check-in to the Emergency Department and triage nurse.   

## 2019-08-26 LAB — CANCER ANTIGEN 27.29: CA 27.29: 33.9 U/mL (ref 0.0–38.6)

## 2019-08-28 ENCOUNTER — Inpatient Hospital Stay: Payer: Medicaid Other | Admitting: Hematology

## 2019-08-28 ENCOUNTER — Telehealth: Payer: Self-pay

## 2019-08-28 NOTE — Telephone Encounter (Signed)
I left a vm for [name] stating that Dr. Burr Medico is canceling her appointment for today and rescheduling for 8/26 at 1100.  I told her the appointment for today was to review PET scan results from 8/16.  As the PET scan was not done Dr. Burr Medico is canceling the appointment for today.  I called [facility] to arrange interpretor for 8/26 arrival 1045.

## 2019-09-05 ENCOUNTER — Ambulatory Visit (HOSPITAL_COMMUNITY)
Admission: RE | Admit: 2019-09-05 | Discharge: 2019-09-05 | Disposition: A | Discharge status: Home / Self Care | Payer: Medicaid Other | Source: Ambulatory Visit | Attending: Hematology | Admitting: Hematology

## 2019-09-05 ENCOUNTER — Other Ambulatory Visit: Payer: Self-pay

## 2019-09-05 DIAGNOSIS — C50911 Malignant neoplasm of unspecified site of right female breast: Secondary | ICD-10-CM | POA: Insufficient documentation

## 2019-09-05 DIAGNOSIS — D7389 Other diseases of spleen: Secondary | ICD-10-CM | POA: Diagnosis not present

## 2019-09-05 DIAGNOSIS — C7951 Secondary malignant neoplasm of bone: Secondary | ICD-10-CM | POA: Diagnosis not present

## 2019-09-05 DIAGNOSIS — Z5111 Encounter for antineoplastic chemotherapy: Secondary | ICD-10-CM | POA: Diagnosis not present

## 2019-09-05 LAB — GLUCOSE, CAPILLARY: Glucose-Capillary: 84 mg/dL (ref 70–99)

## 2019-09-05 MED ORDER — FLUDEOXYGLUCOSE F - 18 (FDG) INJECTION
6.7000 | Freq: Once | INTRAVENOUS | Status: AC | PRN
  Administered 2019-09-05: 6.7 via INTRAVENOUS

## 2019-09-06 NOTE — Progress Notes (Signed)
[name]   [phone] Fax:([phone]   Clinic Follow up Note   Patient Care Team: [name], [name], [name] as PCP - General (Physician Assistant) [name], MD as Consulting Physician (Orthopedic Surgery)  Date of Service:  09/07/2019  CHIEF COMPLAINT: F/u ofmetastatic breast cancer  SUMMARY OF ONCOLOGIC HISTORY: Oncology History Overview Note   Cancer Staging No matching staging information was found for the patient.     [name], right ([place])  11/1997 Cancer Diagnosis   Patient had 1.4 cm poorly differentiated right breast carcinoma diagnosed in Nov 1999 at age 41, 1/7 axillary nodes involved, ER/PR and HER 2 positive. She had mastectomy with the 7 axillary node evaluation, 4 cycles of adriamycin/ cytoxan followed by taxotere, then five years of tamoxifen thru June 2005 (no herceptin in 1999). She had bilateral oophorectomy in May 2005. She was briefly on aromatase inhibitor after tamoxifen, but discontinued this herself due to poor tolerance.   04/04/2015 Genetic Testing   Negative genetic testing on the breast/ovarian cancer pnael.  The Breast/Ovarian gene panel offered by [facility] includes sequencing and rearrangement analysis for the following 20 genes:  ATM, BARD1, BRCA1, BRCA2, BRIP1, CDH1, CHEK2, EPCAM, FANCC, MLH1, MSH2, MSH6, NBN, PALB2, PMS2, PTEN, RAD51C, RAD51D, TP53, and XRCC2.   06/26/2015 Pathology Results   Initial Path Report Bone, curettage, Left femur met, intramedullary subtroch tissue - METASTATIC ADENOCARCINOMA.  Estrogen Receptor: 95%, POSITIVE, STRONG STAINING INTENSITY Progesterone Receptor: 2%, POSITIVE, STRONG  HER2 (+), IHC 3+   06/26/2015 Surgery   Biopsy of metastatic tissue and stabilization with Affixus trochanteric nail, proximal and distal interlock of left femur by Dr. [name]    06/28/2015 Imaging   CT Chest w/ Contrast 1. Extensive right internal mammary chain  lymphadenopathy consistent with metastatic breast cancer. 2. Lytic metastases involving the posterior elements at T1 with probable nondisplaced pathologic fracture of the spinous process. 3. No other evidence of thoracic metastatic disease. 4. Trace bilateral pleural effusions with associated bibasilar atelectasis. 5. Indeterminate right thyroid nodule, not previously imaged. This could be evaluated with thyroid ultrasound as clinically warranted.   06/29/2015 -  Chemotherapy   Zometa started monthly on 06/29/15 and switched to q65months from 01/08/17   07/01/2015 Imaging   Bone Scan 1. Increased activity noted throughout the left femur. Although metastatic disease cannot be excluded, these changes are most likely secondary to prior surgery. 2. No other focal abnormalities identified to suggest metastatic disease.   07/12/2015 - 07/26/2015 Radiation Therapy   Left femur, 30 Gy in 10 fractions by Dr. Sondra Come    07/14/2015 Initial Diagnosis   [name], right ([place])   07/19/2015 Imaging   Initial PET Scan 1. Severe multifocal osseous metastatic disease, much greater than anticipated based on prior imaging. 2. Multifocal nodal metastases involving the right internal mammary, prevascular and subpectoral lymph nodes. No axillary pulmonary involvement identified. 3. No distant extra osseous metastases.   07/30/2015 - 11/29/2015 Chemotherapy   Docetaxel, Herceptin and Perjeta every 3 weeks X6 cycles, pt tolerated moderately well, restaging scan showed excellent response    09/18/2015 Imaging   CT Head w/wo Contrast 1. No acute intracranial abnormality or significant interval change. 2. Stable minimal periventricular white matter hypoattenuation on the right. This may reflect a remote ischemic injury. 3. No evidence for metastatic disease to the brain. 4. No focal soft tissue lesion to explain the patient's tenderness.   10/30/2015 Imaging   CT  Abdomen Pelvis w/  Contrast 1. Few mildly prominent fluid-filled loops of small bowel scattered throughout the abdomen, with intraluminal fluid density within the distal colon. Given the provided history, findings are suggestive of acute enteritis/diarrheal illness. 2. No other acute intra-abdominal or pelvic process identified. 3. Widespread osseous metastatic disease, better evaluated on most recent PET-CT from 07/19/2015. No pathologic fracture or other complication. 4. 11 mm hypodensity within the left kidney. This lesion measures intermediate density, and is indeterminate. While this lesion is similar in size relative to recent studies, this is increased in size relative to prior study from 2012. Further evaluation with dedicated renal mass protocol CT and/or MRI is recommended for complete characterization.   12/2015 - 05/14/2016 Chemotherapy   Maintenance Herceptin and pejeta every 3 weeks stopped on 05/14/16 due to disease progression   12/25/2015 Imaging   Restaging PET Scan 1. Markedly improved skeletal activity, with only several faint foci of residual accentuated metabolic activity, but resolution of the vast majority of the previously extensive osseous metastatic disease. 2. Resolution of the prior right internal mammary and right prevascular lymph nodes. 3. Residual subcutaneous edema and edema along fascia planes in the left upper thigh. This remains somewhat more than I would expect for placement of an IM nail 6 weeks ago. If there is leg swelling further down, consider Doppler venous ultrasound to rule out left lower extremity DVT. I do not perceive an obvious difference in density between the pelvic and common femoral veins on the noncontrast CT data. 4. Chronic right maxillary and right sphenoid sinusitis.   01/16/2016 - 10/20/2017 Anti-estrogen oral therapy   Letrozole 2.5 mg daily. She was switched to exemestane due to disease progression on 02/08/17. Stopped on 10/20/2017 when treatment changed to chemo     03/01/2016 Miscellaneous   Patient presented to ED following a fall; she reports she landed on her back and is now experiencing back pain. The patient was evaluated and discharge home same day with pain control.   05/01/2016 Imaging   CT CAP IMPRESSION: 1. Stable exam.  No new or progressive disease identified. 2. No evidence for residual or recurrent adenopathy within the chest. 3. Stable bone metastasis.   05/01/2016 Imaging   BONE SCAN IMPRESSION: 1. Small focal uptake involving the anterior rib ends of the left second and right fourth ribs, new since the prior bone scan. The location of this uptake is more suggestive of a traumatic or inflammatory etiology as opposed to metastatic disease. No other evidence of metastatic disease. 2. There are other areas of stable uptake which are most likely degenerative/reactive, including right shoulder and cervical spine uptake and uptake along the right femur adjacent to the ORIF hardware.   05/20/2016 Imaging   NM PET Skull to Thigh IMPRESSION: 1. There multiple metastatic foci in the bony pelvis, including some new abnormal foci compare to the prior PET-CT, compatible with mildly progressive bony metastatic disease. Also, a left T11 vertebral hypermetabolic metastatic lesion is observed, new compared to the prior exam. 2. No active extraosseous malignancy is currently identified. 3. Right anterior fourth rib healing fracture, appears be   06/04/2016 - 01/08/2017 Chemotherapy   Second line chemotherapy Kadcyla every 3 weeks started on 06/04/16 and stopped on 01/08/17 due to mixed response.     10/05/2016 Imaging   CT CAP and Bone Scan:  Bones: left intramedullary rod and left femoral head neck screw in place. In the proximal diaphysis of the left femur there is cortical thickening and irregularity.  No lytic or blastic bone lesions. No fracture or vertebral endplate destruction.   No definite evidence of recurrent disease in the  chest, abdomen, or pelvis.   10/28/2016 Imaging   CT A/P IMPRESSION: No acute process demonstrated in the abdomen or pelvis. No evidence of bowel obstruction or inflammation.   11/04/2016 Imaging   DG foot complete left: IMPRESSION: No fracture or dislocation.  No soft tissue abnormality   11/23/2016 Imaging   CT RIGHT FEMUR IMPRESSION: Negative CT scan of the right thigh.  No visible metastatic disease.  CT LEFT FEMUR IMPRESSION: 1. Postsurgical changes related to prior cephalomedullary rod fixation of the left femur with linear lucency along the anterolateral proximal femoral cortex at level of the lesser trochanter, suspicious for nondisplaced fracture. 2. Slightly permeative appearance of the proximal left femoral cortex at the site of known prior osseous metastases is largely unchanged. 3. Unchanged patchy lucency and sclerosis along the anterior acetabulum, which appears to correspond with an area of faint uptake on the prior PET-CT, suspicious for osseous metastasis. These results will be called to the ordering clinician or representative by the Radiologist Assistant, and communication documented in the PACS or zVision Dashboard.     01/27/2017 Imaging   IMPRESSION: 1. Mixed response to osseous metastasis. Some hypermetabolic lesions are new and increasingly hypermetabolic, while another index lesion is decreasingly hypermetabolic. 2. No evidence of hypermetabolic soft tissue metastasis.    02/08/2017 - 10/19/2017 Antibody Plan   -Herceptin every 3 week since 02/08/17 -Oral Lanpantinib 1549m and decreased to 5077mdue to diarrhea and depression. Due to disease progression she swithced to Verzenio 15057mn 05/04/17. She did not toelrate well so she was switched to Ibrance 100m57m 05/31/17. Due to her neutropenia, Ibrance dose reduced to 75 mg daily, 3 weeks on, one-week off, stopped on 10/20/2017 due to disease progression.    04/30/2017 PET scan   IMPRESSION: 1.  Primarily increase in hypermetabolism of osseous metastasis. An isolated right acetabular lesion is decreased in hypermetabolism since the prior exam. 2. No evidence of hypermetabolic soft tissue metastasis. 3. Similar non FDG avid right-sided thyroid nodule, favoring a benign etiology. Recommend attention on follow-up.   08/30/2017 PET scan   08/30/2017 PET Scan  IMPRESSION: 1. Worsening osseous metastatic disease. 2. Focal hypermetabolism in the central spinal canal the L1 level, similar to the prior exam. Metastatic implant cannot be excluded. 3. Slight enlargement of a right thyroid nodule which now appears more cystic in character. Consider further evaluation with thyroid ultrasound. If patient is clinically hyperthyroid, consider nuclear medicine thyroid uptake and scan   08/30/2017 Progression   08/30/2017 PET Scan  IMPRESSION: 1. Worsening osseous metastatic disease. 2. Focal hypermetabolism in the central spinal canal the L1 level, similar to the prior exam. Metastatic implant cannot be excluded. 3. Slight enlargement of a right thyroid nodule which now appears more cystic in character. Consider further evaluation with thyroid ultrasound. If patient is clinically hyperthyroid, consider nuclear medicine thyroid uptake and scan   09/29/2017 - 10/11/2017 Radiation Therapy   Pt received 27 Gy in 9 fractions directed at the areas of significant uptake noted on recent bone scan the right pelvis and right proximal femur, with Dr, KinaSondra Come10/09/2017 - 04/07/2018 Chemotherapy   -Herceptin every 3 weeks starting 02/08/17, perjeta added on 10/20/2017  -weekly Taxol started on 10/21/2017. Will reduce Taxol to 2 weeks on, 1 week off starting on 02/04/17.  Stoped due to mild disease progression   10/27/2017  Imaging   10/27/2017 CT AP IMPRESSION: 1. No acute abnormality. No evidence of bowel obstruction or acute bowel inflammation. Normal appendix. 2. Stable patchy sclerotic osseous  metastases throughout the visualized skeleton. No new or progressive metastatic disease in the abdomen or pelvis.   01/11/2018 Imaging   Whole Body Bone Scan 01/11/18  IMPRESSION: 1. Multifocal osseous metastases as described. 2. Right scapular metastasis. 3. Right superior thoracic spinous process. 4. Right sacral ala and right L5 metastases. 5. Right acetabular metastasis. 6. Right proximal femur metastasis. 7. Diffuse metastases throughout the left femur. 8. Anterior right fourth rib metastasis.   01/11/2018 Imaging   CT CAP W Contrast 01/11/18  IMPRESSION: 1. Similar-appearing osseous metastatic disease. 2. No evidence for progressed metastatic disease in the chest, abdomen or pelvis.   03/30/2018 Imaging   Bone Scan 03/30/18  IMPRESSION: Stable multifocal bony metastatic disease.   03/30/2018 Imaging    CT CAP W Contrast 03/30/18 IMPRESSION: 1. New mild contiguous wall thickening with associated mucosal hyperenhancement throughout the left colon and rectum, suggesting nonspecific infectious or inflammatory proctocolitis. Differential includes C diff colitis. 2. Stable faint patchy sclerosis in the iliac bones and lumbar vertebral bodies. No new or progressive osseous metastatic disease by CT. Please note that the osseous lesions are relatively occult by CT and would be better evaluated by PET-CT, as clinically warranted. 3. No evidence of extra osseous metastatic disease.  These results will be called to the ordering clinician or representative by the Radiologist Assistant, and communication documented in the PACS or zVision Dashboard.   04/15/2018 - 06/16/2018 Chemotherapy   Enhertu q3weeks starting 04/15/18. Stopped Enhertu after cycle 3 on 05/27/18 due to poor toleration due to poor toleration with anorexia, weight loss, N&V, diarrhea   04/21/2018 PET scan   PET 04/21/18  IMPRESSION: 1. Overall marked interval improvement in bony hypermetabolic disease. All of the  previously identified hypermetabolic osseous metastases have decreased in hypermetabolism in the interval. There is a new tiny focus of hypermetabolism in the right aspect of the L3 vertebral body that appears to correspond to a new 8 mm lucency, raising concern for new metastatic disease. Close attention on follow-up recommended. 2. Focal hypermetabolism in the central spinal canal at the level of L1 previously has resolved. 3. No hypermetabolic soft tissue disease on today's study. 4. Right thyroid nodule seen previously has decreased in the interval.    06/16/2018 Imaging   CT LUMBAR SPINE WO CONTRAST 06/16/18 IMPRESSION: 1. Transitional lumbosacral anatomy. Correlation with radiographs is recommended prior to any operative intervention. 2. Suspicion of a right L3 vertebral body metastasis on the April PET-CT is not evident by CT. Stable degenerative versus metastatic lesion at the right L5 superior endplate. Small but increased right iliac bone metastasis since March. Lumbar MRI would best characterize lumbar metastatic disease (without contrast if necessary). 3. Suspect L2-L3 left subarticular segment disc herniation. Query left L2 radiculitis. 4. Stable appearance of rightward L4-L5 disc degeneration which could affect the right foramen and right lateral recess.   06/17/2018 - 11/16/2018 Chemotherapy   Restart Taxol 2 weeks on/1 week off with Herceptin and Perjeta q3weeks on 06/17/18. Perjeta was stopped after C5 due to diarrhea. Stopped Taxol after 11/16/18 due to disease progression to L3.    07/06/2018 PET scan   PET  IMPRESSION: 1. Near total resolution of hypermetabolic activity associated with skeletal metastasis. Very mild activity associated with the LEFT iliac bone which is similar to background blood pool activity. 2. No new or  progressive disease.   11/21/2018 PET scan   IMPRESSION: 1. New focal hypermetabolic lesion eccentric to the right in the L3 vertebral body,  maximum SUV 6.1 compatible with a new focus of active malignancy. 2. Very low-grade activity similar to blood pool in previous existing mildly sclerotic metastatic lesions including the left iliac bone lesion. Some of the mildly sclerotic lesions are not hypermetabolic compatible with effectively treated bony metastatic disease. 3. No hypermetabolic soft tissue lesions are identified.     11/30/2018 - 08/25/2019 Chemotherapy   Continue Herceptin q3weeks. Stopped 08/25/19 due to significant disease progression and switched to  IV Matuzumab   12/12/2018 - 12/17/2018 Chemotherapy   Tucatinib (TUKYSA) 6 tabs BID as a next line therapy starting 12/12/18. Stopped 12/17/18 duet o N&V, poor appetite and HA    12/21/2018 - 03/17/2019 Chemotherapy   IV Eribulin on day 1, 8 every 21 days starting 12/21/18. Stopped 03/17/19 due to mixed response to treatment.    03/21/2019 PET scan   IMPRESSION: 1. Mixed response to therapy of osseous metastasis. Although a right-sided L4 lesion demonstrates decreased hypermetabolism today, new left iliac and right ischial hypermetabolic lesions are seen. 2. No evidence of hypermetabolic soft tissue metastasis.     03/27/2019 - 07/21/2019 Chemotherapy   Neratinib (Nerlynx) titrate up from $Remov'120mg'pDIsPg$  to $R'240mg'YM$  starting 03/27/19. She could not tolerate $RemoveBefo'160mg'dnSydJhkUqS$  and stopped on 02/05/19 due to N&V and diarrhea. Will restart at $RemoveBe'120mg'tgLMrNckh$  on 04/10/18-04/27/19 and 4/19-4/22. Reduced to $RemoveBe'80mg'vHdBtaDbV$  once daily on 05/10/19. Held/stopped since 07/21/19 due to dry cough, Nausea and body aches.   06/23/2019 Imaging   CT Head  IMPRESSION: 1. No acute intracranial abnormality or metastatic disease identified. 2. Chronic white matter disease in the right hemisphere is stable from prior exams.     09/05/2019 PET scan   IMPRESSION: 1. Significant progression of widespread hypermetabolic osseous metastases throughout the axial skeleton as detailed. 2. No hypermetabolic extraosseous sites of metastatic disease.     Chemotherapy   IV Matuzumab q3weeks and IV Gemcitabine    09/08/2019 -  Chemotherapy   The patient had dexamethasone (DECADRON) 4 MG tablet, 8 mg, Oral, Daily, 0 of 1 cycle, Start date: --, End date: -- palonosetron (ALOXI) injection 0.25 mg, 0.25 mg, Intravenous,  Once, 0 of 6 cycles gemcitabine (GEMZAR) 1,330 mg in sodium chloride 0.9 % 250 mL chemo infusion, 800 mg/m2 = 1,330 mg, Intravenous,  Once, 0 of 6 cycles  for chemotherapy treatment.       CURRENT THERAPY:  -Zometa q19months -PENDING IV Matuzumab q3weeks and IV Gemcitabine 2 weeks on/1 week off   INTERVAL HISTORY:  PRISMA DECARLO is here for a follow up. She presents to the clinic with her husband and her Translator. She is on Potassium TID and still not on Neratinib. She notes she is fair. She notes she still has left leg pain but more recent right shoulder and right hip pain related to her cancer. She takes Oxycodone once but will occasionally use 2 tabs if needed.    REVIEW OF SYSTEMS:   Constitutional: Denies fevers, chills or abnormal weight loss Eyes: Denies blurriness of vision Ears, nose, mouth, throat, and face: Denies mucositis or sore throat Respiratory: Denies cough, dyspnea or wheezes Cardiovascular: Denies palpitation, chest discomfort or lower extremity swelling Gastrointestinal:  Denies nausea, heartburn or change in bowel habits Skin: Denies abnormal skin rashes Lymphatics: Denies new lymphadenopathy or easy bruising Neurological:Denies numbness, tingling or new weaknesses Behavioral/Psych: Mood is stable, no new changes  All  other systems were reviewed with the patient and are negative.  MEDICAL HISTORY:  Past Medical History:  Diagnosis Date  . Abnormal Pap smear   . Anxiety   . Breast cancer ([place])   . Cancer ([place]) 1999   breast-s/p mastectomy, chemo, rad  . CIN I (cervical intraepithelial neoplasia I) 2003   by colpo  . Congenital deafness   . Deaf   . Depression   . Port-A-Cath in  place 2017   for chemotherapy  . Radiation 07/12/15-07/26/15   left femur 30 Gy  . S/P bilateral oophorectomy     SURGICAL HISTORY: Past Surgical History:  Procedure Laterality Date  . ABDOMINAL HYSTERECTOMY    . INTRAMEDULLARY (IM) NAIL INTERTROCHANTERIC Left 06/26/2015   Procedure: LEFT BIOMET LONG AFFIXS NAIL;  Surgeon: [name], MD;  Location: [facility];  Service: Orthopedics;  Laterality: Left;  . IR GENERIC HISTORICAL  08/06/2015   IR US GUIDE VASC ACCESS LEFT 08/06/2015 Aletta Edouard, MD [facility]  . IR GENERIC HISTORICAL  08/06/2015   IR FLUORO GUIDE CV LINE LEFT 08/06/2015 Aletta Edouard, MD [facility]  . IR GENERIC HISTORICAL  03/05/2016   IR CV LINE INJECTION 03/05/2016 Markus Daft, MD [facility]  . LEFT HEART CATH AND CORONARY ANGIOGRAPHY N/A 12/13/2017   Procedure: LEFT HEART CATH AND CORONARY ANGIOGRAPHY;  Surgeon: Leonie Man, MD;  Location: [facility];  Service: Cardiovascular;  Laterality: N/A;  . MASTECTOMY     right breast  . MASTECTOMY    . OVARIAN CYST REMOVAL    . RADIOLOGY WITH ANESTHESIA Left 06/25/2015   Procedure: MRI OF LEFT HIP WITH OR WITHOUT CONTRAST;  Surgeon: Medication Radiologist, MD;  Location: [facility];  Service: Radiology;  Laterality: Left;  DR. MCINTYRE/MRI  . TUBAL LIGATION      I have reviewed the social history and family history with the patient and they are unchanged from previous note.  ALLERGIES:  is allergic to promethazine hcl, chlorhexidine, and diphenhydramine hcl.  MEDICATIONS:  Current Outpatient Medications  Medication Sig Dispense Refill  . ALPRAZolam (XANAX) 0.25 MG tablet Take 1 tablet (0.25 mg total) by mouth 2 (two) times daily as needed for anxiety. 30 tablet 0  . diphenoxylate-atropine (LOMOTIL) 2.5-0.025 MG tablet Take 1-2 tablets by mouth 4 (four) times daily as needed for diarrhea or loose stools. 60 tablet 1  . guaiFENesin-codeine 100-10 MG/5ML syrup Take 5 mLs by mouth every 4 (four) hours as needed for  cough. 180 mL 0  . lidocaine-prilocaine (EMLA) cream Apply 1 application topically as needed. Apply to Porta-Cath 1-2 hours prior to access as directed. 90 g 2  . ondansetron (ZOFRAN) 8 MG tablet Take 1 tablet (8 mg total) by mouth every 8 (eight) hours as needed for nausea or vomiting. 45 tablet 1  . oxyCODONE (ROXICODONE) 5 MG immediate release tablet Take 1 tablet (5 mg total) by mouth every 8 (eight) hours as needed for severe pain. 60 tablet 0  . potassium chloride (KLOR-CON) 10 MEQ tablet Take 3 tablets (30 mEq total) by mouth daily. 90 tablet 2  . prochlorperazine (COMPAZINE) 10 MG tablet Take 1 tablet (10 mg total) by mouth 2 (two) times daily as needed for nausea or vomiting. 10 tablet 0  . zolpidem (AMBIEN) 5 MG tablet Take 1-2 tablets (5-10 mg total) by mouth at bedtime as needed. 60 tablet 0   No current facility-administered medications for this visit.   Facility-Administered Medications Ordered in Other Visits  Medication Dose Route Frequency Provider Last Rate Last Admin  . potassium chloride SA (K-DUR) CR tablet 40 mEq  40 mEq Oral Once Truitt Merle, MD      . sodium chloride 0.9 % 1,000 mL with potassium chloride 10 mEq infusion   Intravenous Continuous Gordy Levan, MD   Stopped at 09/10/15 1653  . sodium chloride 0.9 % injection 10 mL  10 mL Intravenous PRN Livesay, Lennis P, MD        PHYSICAL EXAMINATION: ECOG PERFORMANCE STATUS: 2 - Symptomatic, <50% confined to bed  Vitals:   09/07/19 1126  BP: 117/67  Pulse: 77  Resp: 17  Temp: 97.8 F (36.6 C)  SpO2: 99%   Filed Weights   09/07/19 1126  Weight: 129 lb 14.4 oz (58.9 kg)    Due to COVID19 we will limit examination to appearance. Patient had no complaints.  GENERAL:alert, no distress and comfortable SKIN: skin color normal, no rashes or significant lesions EYES: normal, Conjunctiva are pink and non-injected, sclera clear  NEURO: alert & oriented x 3 with fluent speech   LABORATORY DATA:  I have  reviewed the data as listed CBC Latest Ref Rng & Units 08/25/2019 07/28/2019 07/21/2019  WBC 4.0 - 10.5 K/uL 4.8 3.3(L) 6.2  Hemoglobin 12.0 - 15.0 g/dL 10.4(L) 10.7(L) 12.2  Hematocrit 36 - 46 % 30.8(L) 30.8(L) 36.0  Platelets 150 - 400 K/uL 190 203 197     CMP Latest Ref Rng & Units 08/25/2019 07/28/2019 07/21/2019  Glucose 70 - 99 mg/dL 111(H) 110(H) 118(H)  BUN 6 - 20 mg/dL 10 8 11   Creatinine 0.44 - 1.00 mg/dL 1.08(H) 0.83 1.19(H)  Sodium 135 - 145 mmol/L 141 144 141  Potassium 3.5 - 5.1 mmol/L 3.2(L) 2.8(LL) 2.8(LL)  Chloride 98 - 111 mmol/L 107 108 107  CO2 22 - 32 mmol/L 26 25 20(L)  Calcium 8.9 - 10.3 mg/dL 9.4 9.2 9.3  Total Protein 6.5 - 8.1 g/dL 6.1(L) 6.4(L) 6.8  Total Bilirubin 0.3 - 1.2 mg/dL 1.5(H) 1.2 1.3(H)  Alkaline Phos 38 - 126 U/L 63 57 66  AST 15 - 41 U/L 9(L) 14(L) 13(L)  ALT 0 - 44 U/L 6 10 10       RADIOGRAPHIC STUDIES: I have personally reviewed the radiological images as listed and agreed with the findings in the report. No results found.   ASSESSMENT & PLAN:  [name] is a 54 y.o. 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 She had right mastectomy, 4 cycles of AC-T and 5 years of Tamoxifen/AI.  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 She has received Target RT. -She had excellent response to First-line Docetaxel/Herceptin/Perjeta and AI but progressed on maintenance therapy. She had mixed response on second line Kadcyla, progressed on third-line Ibrance/Herceptin and poorly tolerated Enhertu.  -She poorly tolerated low dose Tucatinib (TUKYSA)12/12/18-12/17/18 after trying twice and had mixed response to IV Eribulin with Herceptin 12/21/18-03/17/19, after 5 cycles. -Istartedher next line treatmentwithNeratinib(Nerlynx)on 03/27/19 titration from 120mg  to 240mg  and continued Herceptin. Unfortunately she  could not tolerate Neratinib at dose of 160mg with significant nausea and diarrhea,and barely tolerated 120mg  dose. She retriedat80mg  once dailyNerlynxstartingon 05/08/19.she stopped Nerlynx in early July due to poor tolerance  -We reviewed and discussed her PET from 09/05/19 with pt and her husband in person which shows significant progression of widespread hypermetabolic bone metastasis,  mainly in the spine, right scapula, and pelvis. Otherwise no other new metastatic disease.  -I discussed to control her disease, she would have to change her treatment -I discussed after multiple treatments tried, her remaining options are fewer. I recommend next-line with anti-HER2 IV Margetuximab q3weeks which is a modified version of trastuzumab. She has been tolerating Herceptin. This alone may not be as effective enough so I recommend adding IV Chemo Gemcitabine.    --Chemotherapy consent: Side effects including but does not limited to, fatigue, nausea, vomiting, diarrhea, hair loss, neuropathy, fluid retention, renal and kidney dysfunction, neutropenic fever, needed for blood transfusion, bleeding, were discussed with patient in great detail. She agrees to proceed. -The goal of therapy is palliative -Plan to f/u with start of treatment next week   2.Body pain, secondary to bone mets, Hypocalcemia, leg cramps  -Since her cancer diagnosis,she has had b/l hip, shoulder and chest painat different time points, likely related to her bone mets -She declined Gabapentin given risk of suicidal thoughts. -She is being treated with Zometa q4months. She does have hypocalcemia with leg cramps secondary to Zometa.  -She will continue oral calcium and Vit Dand Prilosec for Acid Reflux. -Current pain includes, lower back, left upper legand more recently in her right hip and right shoulder. PET from 09/05/19 does indicate disease progression in her spine, pelvis and right scapula.  -Pain is currently controlled on  $R'5mg'Op$  Oxycodone 1-2 tabs daily.  -I will start her on next line treatment to control disease and help pain. If pain not improving fast enough, I may refer her for palliative RT.   3.Congenital deafness  4.Mild anemia, secondary to chemotherapy and malignancy -She will start with oral iron daily for 1-2 months and then stop. She will also start daily women's vitamin. -Stable   5.Hypokalemia, Hypomagnesium, secondary toDiarrhea -She developed diarrhea s/p cycle 3 Perjeta.Has been d/c. Now diarrhea due to Nerlynx. -She is onOral potassium 102meq3 times a day.She stopped oral Mag because it made her nauseous.    6.Goal of care discussion, DNR/DNI -We again discussed the incurable nature of her cancer, and the overall poor prognosis, especially if she does not have good response to chemotherapy or progress on chemo -The patient understands the goal of care is palliative. -I recommend DNR/DNI, she agreed.  -I discussed she can start setting up her living will.  -I discussed if there are no remaining effective or tolerable treatment options, I would recommend hospice care to manage her symptoms at that point.   7.Insomnia -She cannot take Benadryl due to allergy reaction. She has tried trazodone with no relief.  -She is on Xanax and Ambien 1-2 tabs nightly.I advised her to not take them at the same time.   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 She declined using ice bags using infusion -stable. Will watch on chemo.  9. Neutropenia, secondary to chemo -Seen after C4D1 with ANC 0.4 on 03/07/19, previously on GCSF -Will watch on next line Gemcitabine.    [place] -likely second to chemotherapy, fluctuates  -Will monitor closely  11. Headachesand dizziness -She notes in the past month she has had intermittent moderate headaches along with feeling mildly off balance walking occasionally. She relates this to vertigo.  -06/23/19 CT head showed No  acute intracranial abnormality or metastatic disease identified. -Headaches did improve since stopping Nerlynx  PLAN: -PET scan reviewed, which showed a significant progression in the bones -I refilled Potassium and Ambien today  -Lab, follow-up and IV margetuximab day 1  and IV Gemcitabine day 1, 8 every 21 days   No problem-specific Assessment & Plan notes found for this encounter.   No orders of the defined types were placed in this encounter.  All questions were answered. The patient knows to call the clinic with any problems, questions or concerns. No barriers to learning was detected. The total time spent in the appointment was 40 minutes.     Truitt Merle, MD 09/07/2019   I, Joslyn Devon, am acting as scribe for Truitt Merle, MD.   I have reviewed the above documentation for accuracy and completeness, and I agree with the above.

## 2019-09-07 ENCOUNTER — Inpatient Hospital Stay (HOSPITAL_BASED_OUTPATIENT_CLINIC_OR_DEPARTMENT_OTHER): Payer: Medicaid Other | Admitting: Hematology

## 2019-09-07 ENCOUNTER — Encounter: Payer: Self-pay | Admitting: Hematology

## 2019-09-07 ENCOUNTER — Other Ambulatory Visit: Payer: Self-pay

## 2019-09-07 VITALS — BP 117/67 | HR 77 | Temp 97.8°F | Resp 17 | Ht 65.0 in | Wt 129.9 lb

## 2019-09-07 DIAGNOSIS — C50911 Malignant neoplasm of unspecified site of right female breast: Secondary | ICD-10-CM | POA: Diagnosis not present

## 2019-09-07 DIAGNOSIS — C7951 Secondary malignant neoplasm of bone: Secondary | ICD-10-CM | POA: Diagnosis not present

## 2019-09-07 DIAGNOSIS — E876 Hypokalemia: Secondary | ICD-10-CM | POA: Diagnosis not present

## 2019-09-07 DIAGNOSIS — R110 Nausea: Secondary | ICD-10-CM | POA: Diagnosis not present

## 2019-09-07 DIAGNOSIS — Z5112 Encounter for antineoplastic immunotherapy: Secondary | ICD-10-CM | POA: Diagnosis not present

## 2019-09-07 MED ORDER — ZOLPIDEM TARTRATE 5 MG PO TABS: 5.0000 mg | ORAL_TABLET | Freq: Every evening | ORAL | 0 refills | Status: DC | PRN

## 2019-09-07 MED ORDER — POTASSIUM CHLORIDE ER 10 MEQ PO TBCR: 30.0000 meq | EXTENDED_RELEASE_TABLET | Freq: Every day | ORAL | 2 refills | Status: DC

## 2019-09-07 NOTE — Progress Notes (Signed)
DISCONTINUE ON PATHWAY REGIMEN - Breast     A cycle is every 21 days:     Eribulin mesylate      Trastuzumab-xxxx      Trastuzumab-xxxx   **Always confirm dose/schedule in your pharmacy ordering system**  REASON: Disease Progression PRIOR TREATMENT: [id number]: Trastuzumab 8/6 mg/kg IV D1 + Eribulin 1.4 mg/m2 D1, 8 q21 Days TREATMENT RESPONSE: Unable to Evaluate  START ON PATHWAY REGIMEN - Breast     A cycle is every 21 days:     Gemcitabine      Margetuximab-cmkb   **Always confirm dose/schedule in your pharmacy ordering system**  Patient Characteristics: Distant Metastases or Locoregional Recurrent Disease - Unresected or Locally Advanced Unresectable Disease Progressing after Neoadjuvant and Local Therapies, HER2 Positive, ER Positive, Chemotherapy + HER2-Targeted Therapy, Fourth Line and Beyond,  Margetuximab + Chemotherapy Indicated Therapeutic Status: Distant Metastases ER Status: Positive (+) HER2 Status: Positive (+) PR Status: Negative (-) Line of Therapy: Fourth Line and Beyond Therapy Indicated: Margetuximab + Chemotherapy Indicated Intent of Therapy: Non-Curative / Palliative Intent, Discussed with Patient

## 2019-09-08 ENCOUNTER — Telehealth: Payer: Self-pay | Admitting: Hematology

## 2019-09-08 ENCOUNTER — Ambulatory Visit: Payer: Self-pay

## 2019-09-08 DIAGNOSIS — C50911 Malignant neoplasm of unspecified site of right female breast: Secondary | ICD-10-CM

## 2019-09-08 DIAGNOSIS — C7951 Secondary malignant neoplasm of bone: Secondary | ICD-10-CM

## 2019-09-08 NOTE — Progress Notes (Unsigned)
Opened to review treatment plan

## 2019-09-08 NOTE — Telephone Encounter (Signed)
Scheduled per 8/26 los. Unable to reach pt. Left voicemail with appt time and dates.

## 2019-09-11 ENCOUNTER — Other Ambulatory Visit: Payer: Self-pay

## 2019-09-11 ENCOUNTER — Other Ambulatory Visit: Payer: Self-pay | Admitting: Hematology

## 2019-09-11 DIAGNOSIS — F5101 Primary insomnia: Secondary | ICD-10-CM

## 2019-09-11 DIAGNOSIS — R110 Nausea: Secondary | ICD-10-CM

## 2019-09-11 MED ORDER — ZOLPIDEM TARTRATE 5 MG PO TABS: 5.0000 mg | ORAL_TABLET | Freq: Every evening | ORAL | 0 refills | Status: DC | PRN

## 2019-09-12 ENCOUNTER — Encounter: Payer: Self-pay | Admitting: Hematology

## 2019-09-13 ENCOUNTER — Encounter: Payer: Self-pay | Admitting: Hematology

## 2019-09-13 ENCOUNTER — Other Ambulatory Visit: Payer: Self-pay | Admitting: Hematology

## 2019-09-13 ENCOUNTER — Telehealth: Payer: Self-pay

## 2019-09-13 DIAGNOSIS — C7951 Secondary malignant neoplasm of bone: Secondary | ICD-10-CM

## 2019-09-13 MED ORDER — OXYCODONE HCL 5 MG PO TABS: 5.0000 mg | ORAL_TABLET | Freq: Three times a day (TID) | ORAL | 0 refills | Status: DC | PRN

## 2019-09-13 NOTE — Progress Notes (Signed)
[place]   [phone] [phone]   Clinic Follow up Note   Patient Care Team: [name], [name], [name] as PCP - General (Physician Assistant) [name], MD as Consulting Physician (Orthopedic Surgery)  Date of Service:  09/15/2019  CHIEF COMPLAINT: F/u ofmetastatic breast cancer  SUMMARY OF ONCOLOGIC HISTORY: Oncology History Overview Note   Cancer Staging No matching staging information was found for the patient.     [name], right ([place])  11/1997 Cancer Diagnosis   Patient had 1.4 cm poorly differentiated right breast carcinoma diagnosed in Nov 1999 at age 31, 1/7 axillary nodes involved, ER/PR and HER 2 positive. She had mastectomy with the 7 axillary node evaluation, 4 cycles of adriamycin/ cytoxan followed by taxotere, then five years of tamoxifen thru June 2005 (no herceptin in 1999). She had bilateral oophorectomy in May 2005. She was briefly on aromatase inhibitor after tamoxifen, but discontinued this herself due to poor tolerance.   04/04/2015 Genetic Testing   Negative genetic testing on the breast/ovarian cancer pnael.  The Breast/Ovarian gene panel offered by [facility] includes sequencing and rearrangement analysis for the following 20 genes:  ATM, BARD1, BRCA1, BRCA2, BRIP1, CDH1, CHEK2, EPCAM, FANCC, MLH1, MSH2, MSH6, NBN, PALB2, PMS2, PTEN, RAD51C, RAD51D, TP53, and XRCC2.   06/26/2015 Pathology Results   Initial Path Report Bone, curettage, Left femur met, intramedullary subtroch tissue - METASTATIC ADENOCARCINOMA.  Estrogen Receptor: 95%, POSITIVE, STRONG STAINING INTENSITY Progesterone Receptor: 2%, POSITIVE, STRONG  HER2 (+), IHC 3+   06/26/2015 Surgery   Biopsy of metastatic tissue and stabilization with Affixus trochanteric nail, proximal and distal interlock of left femur by Dr. [name]    06/28/2015 Imaging   CT Chest w/ Contrast 1. Extensive right internal mammary chain  lymphadenopathy consistent with metastatic breast cancer. 2. Lytic metastases involving the posterior elements at T1 with probable nondisplaced pathologic fracture of the spinous process. 3. No other evidence of thoracic metastatic disease. 4. Trace bilateral pleural effusions with associated bibasilar atelectasis. 5. Indeterminate right thyroid nodule, not previously imaged. This could be evaluated with thyroid ultrasound as clinically warranted.   06/29/2015 -  Chemotherapy   Zometa started monthly on 06/29/15 and switched to q31month from 01/08/17   07/01/2015 Imaging   Bone Scan 1. Increased activity noted throughout the left femur. Although metastatic disease cannot be excluded, these changes are most likely secondary to prior surgery. 2. No other focal abnormalities identified to suggest metastatic disease.   07/12/2015 - 07/26/2015 Radiation Therapy   Left femur, 30 Gy in 10 fractions by Dr. [name]   07/14/2015 Initial Diagnosis   [name], right [place]   07/19/2015 Imaging   Initial PET Scan 1. Severe multifocal osseous metastatic disease, much greater than anticipated based on prior imaging. 2. Multifocal nodal metastases involving the right internal mammary, prevascular and subpectoral lymph nodes. No axillary pulmonary involvement identified. 3. No distant extra osseous metastases.   07/30/2015 - 11/29/2015 Chemotherapy   Docetaxel, Herceptin and Perjeta every 3 weeks X6 cycles, pt tolerated moderately well, restaging scan showed excellent response    09/18/2015 Imaging   CT Head w/wo Contrast 1. No acute intracranial abnormality or significant interval change. 2. Stable minimal periventricular white matter hypoattenuation on the right. This may reflect a remote ischemic injury. 3. No evidence for metastatic disease to the brain. 4. No focal soft tissue lesion to explain the patient's tenderness.   10/30/2015 Imaging   CT  Abdomen Pelvis w/  Contrast 1. Few mildly prominent fluid-filled loops of small bowel scattered throughout the abdomen, with intraluminal fluid density within the distal colon. Given the provided history, findings are suggestive of acute enteritis/diarrheal illness. 2. No other acute intra-abdominal or pelvic process identified. 3. Widespread osseous metastatic disease, better evaluated on most recent PET-CT from 07/19/2015. No pathologic fracture or other complication. 4. 11 mm hypodensity within the left kidney. This lesion measures intermediate density, and is indeterminate. While this lesion is similar in size relative to recent studies, this is increased in size relative to prior study from 2012. Further evaluation with dedicated renal mass protocol CT and/or MRI is recommended for complete characterization.   12/2015 - 05/14/2016 Chemotherapy   Maintenance Herceptin and pejeta every 3 weeks stopped on 05/14/16 due to disease progression   12/25/2015 Imaging   Restaging PET Scan 1. Markedly improved skeletal activity, with only several faint foci of residual accentuated metabolic activity, but resolution of the vast majority of the previously extensive osseous metastatic disease. 2. Resolution of the prior right internal mammary and right prevascular lymph nodes. 3. Residual subcutaneous edema and edema along fascia planes in the left upper thigh. This remains somewhat more than I would expect for placement of an IM nail 6 weeks ago. If there is leg swelling further down, consider Doppler venous ultrasound to rule out left lower extremity DVT. I do not perceive an obvious difference in density between the pelvic and common femoral veins on the noncontrast CT data. 4. Chronic right maxillary and right sphenoid sinusitis.   01/16/2016 - 10/20/2017 Anti-estrogen oral therapy   Letrozole 2.5 mg daily. She was switched to exemestane due to disease progression on 02/08/17. Stopped on 10/20/2017 when treatment changed to chemo     03/01/2016 Miscellaneous   Patient presented to ED following a fall; she reports she landed on her back and is now experiencing back pain. The patient was evaluated and discharge home same day with pain control.   05/01/2016 Imaging   CT CAP IMPRESSION: 1. Stable exam.  No new or progressive disease identified. 2. No evidence for residual or recurrent adenopathy within the chest. 3. Stable bone metastasis.   05/01/2016 Imaging   BONE SCAN IMPRESSION: 1. Small focal uptake involving the anterior rib ends of the left second and right fourth ribs, new since the prior bone scan. The location of this uptake is more suggestive of a traumatic or inflammatory etiology as opposed to metastatic disease. No other evidence of metastatic disease. 2. There are other areas of stable uptake which are most likely degenerative/reactive, including right shoulder and cervical spine uptake and uptake along the right femur adjacent to the ORIF hardware.   05/20/2016 Imaging   NM PET Skull to Thigh IMPRESSION: 1. There multiple metastatic foci in the bony pelvis, including some new abnormal foci compare to the prior PET-CT, compatible with mildly progressive bony metastatic disease. Also, a left T11 vertebral hypermetabolic metastatic lesion is observed, new compared to the prior exam. 2. No active extraosseous malignancy is currently identified. 3. Right anterior fourth rib healing fracture, appears be   06/04/2016 - 01/08/2017 Chemotherapy   Second line chemotherapy Kadcyla every 3 weeks started on 06/04/16 and stopped on 01/08/17 due to mixed response.     10/05/2016 Imaging   CT CAP and Bone Scan:  Bones: left intramedullary rod and left femoral head neck screw in place. In the proximal diaphysis of the left femur there is cortical thickening and irregularity.  No lytic or blastic bone lesions. No fracture or vertebral endplate destruction.   No definite evidence of recurrent disease in the  chest, abdomen, or pelvis.   10/28/2016 Imaging   CT A/P IMPRESSION: No acute process demonstrated in the abdomen or pelvis. No evidence of bowel obstruction or inflammation.   11/04/2016 Imaging   DG foot complete left: IMPRESSION: No fracture or dislocation.  No soft tissue abnormality   11/23/2016 Imaging   CT RIGHT FEMUR IMPRESSION: Negative CT scan of the right thigh.  No visible metastatic disease.  CT LEFT FEMUR IMPRESSION: 1. Postsurgical changes related to prior cephalomedullary rod fixation of the left femur with linear lucency along the anterolateral proximal femoral cortex at level of the lesser trochanter, suspicious for nondisplaced fracture. 2. Slightly permeative appearance of the proximal left femoral cortex at the site of known prior osseous metastases is largely unchanged. 3. Unchanged patchy lucency and sclerosis along the anterior acetabulum, which appears to correspond with an area of faint uptake on the prior PET-CT, suspicious for osseous metastasis. These results will be called to the ordering clinician or representative by the Radiologist Assistant, and communication documented in the PACS or zVision Dashboard.     01/27/2017 Imaging   IMPRESSION: 1. Mixed response to osseous metastasis. Some hypermetabolic lesions are new and increasingly hypermetabolic, while another index lesion is decreasingly hypermetabolic. 2. No evidence of hypermetabolic soft tissue metastasis.    02/08/2017 - 10/19/2017 Antibody Plan   -Herceptin every 3 week since 02/08/17 -Oral Lanpantinib 1541m and decreased to 5083mdue to diarrhea and depression. Due to disease progression she swithced to Verzenio 15032mn 05/04/17. She did not toelrate well so she was switched to Ibrance 100m71m 05/31/17. Due to her neutropenia, Ibrance dose reduced to 75 mg daily, 3 weeks on, one-week off, stopped on 10/20/2017 due to disease progression.    04/30/2017 PET scan   IMPRESSION: 1.  Primarily increase in hypermetabolism of osseous metastasis. An isolated right acetabular lesion is decreased in hypermetabolism since the prior exam. 2. No evidence of hypermetabolic soft tissue metastasis. 3. Similar non FDG avid right-sided thyroid nodule, favoring a benign etiology. Recommend attention on follow-up.   08/30/2017 PET scan   08/30/2017 PET Scan  IMPRESSION: 1. Worsening osseous metastatic disease. 2. Focal hypermetabolism in the central spinal canal the L1 level, similar to the prior exam. Metastatic implant cannot be excluded. 3. Slight enlargement of a right thyroid nodule which now appears more cystic in character. Consider further evaluation with thyroid ultrasound. If patient is clinically hyperthyroid, consider nuclear medicine thyroid uptake and scan   08/30/2017 Progression   08/30/2017 PET Scan  IMPRESSION: 1. Worsening osseous metastatic disease. 2. Focal hypermetabolism in the central spinal canal the L1 level, similar to the prior exam. Metastatic implant cannot be excluded. 3. Slight enlargement of a right thyroid nodule which now appears more cystic in character. Consider further evaluation with thyroid ultrasound. If patient is clinically hyperthyroid, consider nuclear medicine thyroid uptake and scan   09/29/2017 - 10/11/2017 Radiation Therapy   Pt received 27 Gy in 9 fractions directed at the areas of significant uptake noted on recent bone scan the right pelvis and right proximal femur, with Dr, KinaSondra Come10/09/2017 - 04/07/2018 Chemotherapy   -Herceptin every 3 weeks starting 02/08/17, perjeta added on 10/20/2017  -weekly Taxol started on 10/21/2017. Will reduce Taxol to 2 weeks on, 1 week off starting on 02/04/17.  Stoped due to mild disease progression   10/27/2017  Imaging   10/27/2017 CT AP IMPRESSION: 1. No acute abnormality. No evidence of bowel obstruction or acute bowel inflammation. Normal appendix. 2. Stable patchy sclerotic osseous  metastases throughout the visualized skeleton. No new or progressive metastatic disease in the abdomen or pelvis.   01/11/2018 Imaging   Whole Body Bone Scan 01/11/18  IMPRESSION: 1. Multifocal osseous metastases as described. 2. Right scapular metastasis. 3. Right superior thoracic spinous process. 4. Right sacral ala and right L5 metastases. 5. Right acetabular metastasis. 6. Right proximal femur metastasis. 7. Diffuse metastases throughout the left femur. 8. Anterior right fourth rib metastasis.   01/11/2018 Imaging   CT CAP W Contrast 01/11/18  IMPRESSION: 1. Similar-appearing osseous metastatic disease. 2. No evidence for progressed metastatic disease in the chest, abdomen or pelvis.   03/30/2018 Imaging   Bone Scan 03/30/18  IMPRESSION: Stable multifocal bony metastatic disease.   03/30/2018 Imaging    CT CAP W Contrast 03/30/18 IMPRESSION: 1. New mild contiguous wall thickening with associated mucosal hyperenhancement throughout the left colon and rectum, suggesting nonspecific infectious or inflammatory proctocolitis. Differential includes C diff colitis. 2. Stable faint patchy sclerosis in the iliac bones and lumbar vertebral bodies. No new or progressive osseous metastatic disease by CT. Please note that the osseous lesions are relatively occult by CT and would be better evaluated by PET-CT, as clinically warranted. 3. No evidence of extra osseous metastatic disease.  These results will be called to the ordering clinician or representative by the Radiologist Assistant, and communication documented in the PACS or zVision Dashboard.   04/15/2018 - 06/16/2018 Chemotherapy   Enhertu q3weeks starting 04/15/18. Stopped Enhertu after cycle 3 on 05/27/18 due to poor toleration due to poor toleration with anorexia, weight loss, N&V, diarrhea   04/21/2018 PET scan   PET 04/21/18  IMPRESSION: 1. Overall marked interval improvement in bony hypermetabolic disease. All of the  previously identified hypermetabolic osseous metastases have decreased in hypermetabolism in the interval. There is a new tiny focus of hypermetabolism in the right aspect of the L3 vertebral body that appears to correspond to a new 8 mm lucency, raising concern for new metastatic disease. Close attention on follow-up recommended. 2. Focal hypermetabolism in the central spinal canal at the level of L1 previously has resolved. 3. No hypermetabolic soft tissue disease on today's study. 4. Right thyroid nodule seen previously has decreased in the interval.    06/16/2018 Imaging   CT LUMBAR SPINE WO CONTRAST 06/16/18 IMPRESSION: 1. Transitional lumbosacral anatomy. Correlation with radiographs is recommended prior to any operative intervention. 2. Suspicion of a right L3 vertebral body metastasis on the April PET-CT is not evident by CT. Stable degenerative versus metastatic lesion at the right L5 superior endplate. Small but increased right iliac bone metastasis since March. Lumbar MRI would best characterize lumbar metastatic disease (without contrast if necessary). 3. Suspect L2-L3 left subarticular segment disc herniation. Query left L2 radiculitis. 4. Stable appearance of rightward L4-L5 disc degeneration which could affect the right foramen and right lateral recess.   06/17/2018 - 11/16/2018 Chemotherapy   Restart Taxol 2 weeks on/1 week off with Herceptin and Perjeta q3weeks on 06/17/18. Perjeta was stopped after C5 due to diarrhea. Stopped Taxol after 11/16/18 due to disease progression to L3.    07/06/2018 PET scan   PET  IMPRESSION: 1. Near total resolution of hypermetabolic activity associated with skeletal metastasis. Very mild activity associated with the LEFT iliac bone which is similar to background blood pool activity. 2. No new or  progressive disease.   11/21/2018 PET scan   IMPRESSION: 1. New focal hypermetabolic lesion eccentric to the right in the L3 vertebral body,  maximum SUV 6.1 compatible with a new focus of active malignancy. 2. Very low-grade activity similar to blood pool in previous existing mildly sclerotic metastatic lesions including the left iliac bone lesion. Some of the mildly sclerotic lesions are not hypermetabolic compatible with effectively treated bony metastatic disease. 3. No hypermetabolic soft tissue lesions are identified.     11/30/2018 - 08/25/2019 Chemotherapy   Continue Herceptin q3weeks. Stopped 08/25/19 due to significant disease progression and switched to  IV Matuzumab   12/12/2018 - 12/17/2018 Chemotherapy   Tucatinib (TUKYSA) 6 tabs BID as a next line therapy starting 12/12/18. Stopped 12/17/18 duet o N&V, poor appetite and HA    12/21/2018 - 03/17/2019 Chemotherapy   IV Eribulin on day 1, 8 every 21 days starting 12/21/18. Stopped 03/17/19 due to mixed response to treatment.    03/21/2019 PET scan   IMPRESSION: 1. Mixed response to therapy of osseous metastasis. Although a right-sided L4 lesion demonstrates decreased hypermetabolism today, new left iliac and right ischial hypermetabolic lesions are seen. 2. No evidence of hypermetabolic soft tissue metastasis.     03/27/2019 - 07/21/2019 Chemotherapy   Neratinib (Nerlynx) titrate up from 122m to 2460mstarting 03/27/19. She could not tolerate 16012mnd stopped on 02/05/19 due to N&V and diarrhea. Will restart at 120m67m 04/10/18-04/27/19 and 4/19-4/22. Reduced to 80mg3me daily on 05/10/19. Held/stopped since 07/21/19 due to dry cough, Nausea and body aches.   06/23/2019 Imaging   CT Head  IMPRESSION: 1. No acute intracranial abnormality or metastatic disease identified. 2. Chronic white matter disease in the right hemisphere is stable from prior exams.     09/05/2019 PET scan   IMPRESSION: 1. Significant progression of widespread hypermetabolic osseous metastases throughout the axial skeleton as detailed. 2. No hypermetabolic extraosseous sites of metastatic disease.    09/15/2019 -  Chemotherapy   IV Matuzumab q3weeks and IV Gemcitabine 2 weeks on/1 week off starting 09/15/19      CURRENT THERAPY:  -Zometa q3mont64monthMatuzumab q3weeks and IV Gemcitabine 2 weeks on/1 week off starting 09/15/19  INTERVAL HISTORY:  Debra DANASIA BAKERre for a follow up. She presents to the clinic alone with her interpretor. She notes she just received her oxycodone yesterday which took longer to receive. She notes when she tried to pick up her Ambien she was told they do not have it. She notes she is having difficulty to sleep since being off.    REVIEW OF SYSTEMS:   Constitutional: Denies fevers, chills or abnormal weight loss Eyes: Denies blurriness of vision Ears, nose, mouth, throat, and face: Denies mucositis or sore throat Respiratory: Denies cough, dyspnea or wheezes Cardiovascular: Denies palpitation, chest discomfort or lower extremity swelling Gastrointestinal:  Denies nausea, heartburn or change in bowel habits Skin: Denies abnormal skin rashes Lymphatics: Denies new lymphadenopathy or easy bruising Neurological:Denies numbness, tingling or new weaknesses Behavioral/Psych: Mood is stable, no new changes  All other systems were reviewed with the patient and are negative.  MEDICAL HISTORY:  Past Medical History:  Diagnosis Date   Abnormal Pap smear    Anxiety    Breast cancer (HCC)  [place] ([facility]) [place]   breast-s/p mastectomy, chemo, rad   CIN I (cervical intraepithelial neoplasia I) 2003   by colpo   Congenital deafness    Deaf    Depression  Port-A-Cath in place 2017   for chemotherapy   Radiation 07/12/15-07/26/15   left femur 30 Gy   S/P bilateral oophorectomy     SURGICAL HISTORY: Past Surgical History:  Procedure Laterality Date   ABDOMINAL HYSTERECTOMY     INTRAMEDULLARY (IM) NAIL INTERTROCHANTERIC Left 06/26/2015   Procedure: LEFT BIOMET LONG AFFIXS NAIL;  Surgeon: [name], MD;  Location: [facility];  Service:  Orthopedics;  Laterality: Left;   IR GENERIC HISTORICAL  08/06/2015   IR US GUIDE VASC ACCESS LEFT 08/06/2015 Aletta Edouard, MD [facility]   IR GENERIC HISTORICAL  08/06/2015   IR FLUORO GUIDE CV LINE LEFT 08/06/2015 Aletta Edouard, MD [facility]   IR GENERIC HISTORICAL  03/05/2016   IR CV LINE INJECTION 03/05/2016 Markus Daft, MD [facility]   LEFT HEART CATH AND CORONARY ANGIOGRAPHY N/A 12/13/2017   Procedure: LEFT HEART CATH AND CORONARY ANGIOGRAPHY;  Surgeon: Leonie Man, MD;  Location: [facility];  Service: Cardiovascular;  Laterality: N/A;   MASTECTOMY     right breast   MASTECTOMY     OVARIAN CYST REMOVAL     RADIOLOGY WITH ANESTHESIA Left 06/25/2015   Procedure: MRI OF LEFT HIP WITH OR WITHOUT CONTRAST;  Surgeon: Medication Radiologist, MD;  Location: [facility];  Service: Radiology;  Laterality: Left;  DR. MCINTYRE/MRI   TUBAL LIGATION      I have reviewed the social history and family history with the patient and they are unchanged from previous note.  ALLERGIES:  is allergic to promethazine hcl, chlorhexidine, and diphenhydramine hcl.  MEDICATIONS:  Current Outpatient Medications  Medication Sig Dispense Refill   ALPRAZolam (XANAX) 0.25 MG tablet Take 1 tablet (0.25 mg total) by mouth 2 (two) times daily as needed for anxiety. 30 tablet 0   diphenoxylate-atropine (LOMOTIL) 2.5-0.025 MG tablet Take 1-2 tablets by mouth 4 (four) times daily as needed for diarrhea or loose stools. 60 tablet 1   guaiFENesin-codeine 100-10 MG/5ML syrup Take 5 mLs by mouth every 4 (four) hours as needed for cough. 180 mL 0   lidocaine-prilocaine (EMLA) cream Apply 1 application topically as needed. Apply to Porta-Cath 1-2 hours prior to access as directed. 90 g 2   ondansetron (ZOFRAN) 8 MG tablet Take 1 tablet (8 mg total) by mouth every 8 (eight) hours as needed for nausea or vomiting. 45 tablet 1   oxyCODONE (ROXICODONE) 5 MG immediate release tablet Take 1 tablet (5 mg  total) by mouth every 8 (eight) hours as needed for severe pain. 60 tablet 0   potassium chloride (KLOR-CON) 10 MEQ tablet Take 3 tablets (30 mEq total) by mouth daily. 90 tablet 2   prochlorperazine (COMPAZINE) 10 MG tablet Take 1 tablet (10 mg total) by mouth 2 (two) times daily as needed for nausea or vomiting. 10 tablet 0   zolpidem (AMBIEN) 10 MG tablet Take 1 tablet (10 mg total) by mouth at bedtime as needed for sleep. 30 tablet 0   No current facility-administered medications for this visit.   Facility-Administered Medications Ordered in Other Visits  Medication Dose Route Frequency Provider Last Rate Last Admin   potassium chloride SA (K-DUR) CR tablet 40 mEq  40 mEq Oral Once Truitt Merle, MD       sodium chloride 0.9 % 1,000 mL with potassium chloride 10 mEq infusion   Intravenous Continuous Lives[place], Lennis P, MD   Stopped at 09/10/15 1653   sodium chloride 0.9 % injection 10 mL  10 mL Intravenous PRN  Gordy Levan, MD        PHYSICAL EXAMINATION: ECOG PERFORMANCE STATUS: 2 - Symptomatic, <50% confined to bed  Vitals:   09/15/19 0851  BP: 117/70  Pulse: 78  Resp: 18  Temp: (!) 97.4 F (36.3 C)  SpO2: 100%   Filed Weights   09/15/19 0851  Weight: 132 lb 8 oz (60.1 kg)    Due to COVID19 we will limit examination to appearance. Patient had no complaints.  GENERAL:alert, no distress and comfortable SKIN: skin color normal, no rashes or significant lesions EYES: normal, Conjunctiva are pink and non-injected, sclera clear  NEURO: alert & oriented x 3 with fluent speech   LABORATORY DATA:  I have reviewed the data as listed CBC Latest Ref Rng & Units 09/15/2019 08/25/2019 07/28/2019  WBC 4.0 - 10.5 K/uL 4.0 4.8 3.3(L)  Hemoglobin 12.0 - 15.0 g/dL 11.0(L) 10.4(L) 10.7(L)  Hematocrit 36 - 46 % 32.2(L) 30.8(L) 30.8(L)  Platelets 150 - 400 K/uL 200 190 203     CMP Latest Ref Rng & Units 08/25/2019 07/28/2019 07/21/2019  Glucose 70 - 99 mg/dL 111(H) 110(H) 118(H)  BUN 6  - 20 mg/dL _0 Creatinine 0.44 - 1.00 mg/dL 1.08(H) 0.83 1.19(H)  Sodium 135 - 145 mmol/L 141 144 141  Potassium 3.5 - 5.1 mmol/L 3.2(L) 2.8(LL) 2.8(LL)  Chloride 98 - 111 mmol/L 107 108 107  CO2 22 - 32 mmol/L 26 25 20(L)  Calcium 8.9 - 10.3 mg/dL 9.4 9.2 9.3  Total Protein 6.5 - 8.1 g/dL 6.1(L) 6.4(L) 6.8  Total Bilirubin 0.3 - 1.2 mg/dL 1.5(H) 1.2 1.3(H)  Alkaline Phos 38 - 126 U/L 63 57 66  AST 15 - 41 U/L 9(L) 14(L) 13(L)  ALT 0 - 44 U/L _1 RADIOGRAPHIC STUDIES: I have personally reviewed the radiological images as listed and agreed with the findings in the report. No results found.   ASSESSMENT & PLAN:  EYDIE WORMLEY is a 54 y.o. 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 She had right mastectomy, 4 cycles of AC-T and 5 years of Tamoxifen/AI.  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  -After her first line of treatment and initial maintenance therapy, she progressed or had poor toleration of multiple lines of treatment since. Latest in 08/2019.  -I previously discussed after multiple treatments tried, her remaining options are fewer. I recommended next-line (7th line) with anti-HER2 IV Margetuximab q3weeks with IV Chemo weekly Gemcitabine 2 weeks on/1 week off. Plan to start today (09/15/19) -Labs reviewed and adequate to proceed with C1D1 Margetuximab and Gemcitabine. I reviewed side effects to watch for.  -Will proceed with D8 Gemcitabine next week  -F/u in 3 weeks with C2   2.Body pain, secondary to bone mets, Hypocalcemia, leg cramps  -She is being treated with Zometa q46month.  -She will continue oral calcium and Vit Dand Prilosec for Acid Reflux. -Current pain includes, lower back, left upper legand more recently in her right hip and right shoulder. PET from 09/05/19 does indicate disease progression  in her spine, pelvis and right scapula.  -Pain is currently controlled on 555mOxycodone 1-2 tabs daily.  -I started her on next line treatment (09/15/19) to control disease and help pain. If pain not improving fast enough, I may refer her for palliative RT.  3.Congenital deafness  4.Mild anemia, secondary to chemotherapy and malignancy  -Stable   5.Hypokalemia, Hypomagnesium, secondary toDiarrhea -She developed diarrhea s/p cycle 3 Perjeta and again with Nerlynx, Both have been D/c. -She is onOral potassium 67mq3 times a day.She stopped oral Mag because it made her nauseous.    6.Goal of care discussion, DNR/DNI -We recently reviewed goal of care and I recommend DNR/DNI, she agreed.  -I encouraged her to start setting up her living will. Will refer her to hospice home care if she is no longer on any treatment.   7.Insomnia -She cannot take Benadryl due to allergy reaction. She has tried trazodone with no relief.  -She is on Xanax and Ambien 1-2 tabs nightly. She knows not to take both at the same time.  -Her insurance is only covering 15 tablets a month of Ambien 583m I will refill her Ambien at 1090mo use half tablets moving forward. She agreed.   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 Stable  9. Mildhyperbilirubinemia  -likely second to chemotherapy, fluctuates  -Will monitor closely   PLAN: -I refilled Ambien 75m73mday  -Labs reviewed and adequate to proceed with C1D1 Margetuximab and Gemcitabine today  -Lab, flush, Day 8 Gemcitabine next week  -Lab, flush, F/u and Margetuximab and Gemcitabine in 3 weeks  -Lab, flush, and Day 8 Gemcitabine in 4 weeks  -Zometa infusion in 3 weeks     No problem-specific Assessment & Plan notes found for this encounter.   No orders of the defined types were placed in this encounter.  All questions were answered. The patient knows to call the clinic with any problems, questions or concerns. No barriers to learning  was detected. The total time spent in the appointment was 30 minutes.     Jacque Byron Truitt Merle 09/15/2019   I, AmoyJoslyn Devon acting as scribe for Shahzain Kiester Truitt Merle.   I have reviewed the above documentation for accuracy and completeness, and I agree with the above.

## 2019-09-13 NOTE — Progress Notes (Signed)
Pharmacist Chemotherapy Monitoring - Initial Assessment    Anticipated start date: 9/3/   Regimen:  . Are orders appropriate based on the patient's diagnosis, regimen, and cycle? Yes . Does the plan date match the patient's scheduled date? Yes . Is the sequencing of drugs appropriate? Yes . Are the premedications appropriate for the patient's regimen? Yes . Prior Authorization for treatment is: Approved o If applicable, is the correct biosimilar selected based on the patient's insurance? yes  Organ Function and Labs: Marland Kitchen Are dose adjustments needed based on the patient's renal function, hepatic function, or hematologic function? Yes . Are appropriate labs ordered prior to the start of patient's treatment? Yes . Other organ system assessment, if indicated: N/A . The following baseline labs, if indicated, have been ordered: N/A  Dose Assessment: . Are the drug doses appropriate? Yes . Are the following correct: o Drug concentrations Yes o IV fluid compatible with drug Yes o Administration routes Yes o Timing of therapy Yes . If applicable, does the patient have documented access for treatment and/or plans for port-a-cath placement? yes . If applicable, have lifetime cumulative doses been properly documented and assessed? no Lifetime Dose Tracking  No doses have been documented on this patient for the following tracked chemicals: Doxorubicin, Epirubicin, Idarubicin, Daunorubicin, Mitoxantrone, Bleomycin, Oxaliplatin, Carboplatin, Liposomal Doxorubicin  o   Toxicity Monitoring/Prevention: . The patient has the following take home antiemetics prescribed: Ondansetron and Prochlorperazine . The patient has the following take home medications prescribed: N/A . Medication allergies and previous infusion related reactions, if applicable, have been reviewed and addressed. No . The patient's current medication list has been assessed for drug-drug interactions with their chemotherapy regimen. no  significant drug-drug interactions were identified on review.  Order Review: . Are the treatment plan orders signed? Yes . Is the patient scheduled to see a provider prior to their treatment? Yes  I verify that I have reviewed each item in the above checklist and answered each question accordingly.  [name] D 09/13/2019 4:18 PM

## 2019-09-13 NOTE — Telephone Encounter (Signed)
I spoke with [name] and let her know her appts for this Friday 09/15/2019 were moved to 0800.  She stated she has to be done by 1300.  I told her I had no control over that.  I told her to let her infusion nurse know on Friday.  She states that the [place] prescription is still not at the [facility] pharmacy.  I called and spoke with pharmacy staff.  The prescription is there.  There are 2 issues the insurance will only pay for 15 pills/ 30 days and it is too soon for insurance to pay.  I also relayed this information to [place] via [employer].

## 2019-09-15 ENCOUNTER — Inpatient Hospital Stay: Payer: Medicaid Other

## 2019-09-15 ENCOUNTER — Other Ambulatory Visit: Payer: Self-pay

## 2019-09-15 ENCOUNTER — Inpatient Hospital Stay: Payer: Medicaid Other | Attending: Hematology | Admitting: Hematology

## 2019-09-15 VITALS — BP 117/70 | HR 78 | Temp 97.4°F | Resp 18 | Ht 65.0 in | Wt 132.5 lb

## 2019-09-15 DIAGNOSIS — Z95828 Presence of other vascular implants and grafts: Secondary | ICD-10-CM

## 2019-09-15 DIAGNOSIS — Z5112 Encounter for antineoplastic immunotherapy: Secondary | ICD-10-CM | POA: Insufficient documentation

## 2019-09-15 DIAGNOSIS — D6481 Anemia due to antineoplastic chemotherapy: Secondary | ICD-10-CM | POA: Diagnosis not present

## 2019-09-15 DIAGNOSIS — G620 Drug-induced polyneuropathy: Secondary | ICD-10-CM | POA: Insufficient documentation

## 2019-09-15 DIAGNOSIS — A0472 Enterocolitis due to Clostridium difficile, not specified as recurrent: Secondary | ICD-10-CM | POA: Diagnosis not present

## 2019-09-15 DIAGNOSIS — C50911 Malignant neoplasm of unspecified site of right female breast: Secondary | ICD-10-CM | POA: Insufficient documentation

## 2019-09-15 DIAGNOSIS — Z5189 Encounter for other specified aftercare: Secondary | ICD-10-CM | POA: Diagnosis not present

## 2019-09-15 DIAGNOSIS — E876 Hypokalemia: Secondary | ICD-10-CM | POA: Insufficient documentation

## 2019-09-15 DIAGNOSIS — C7951 Secondary malignant neoplasm of bone: Secondary | ICD-10-CM

## 2019-09-15 DIAGNOSIS — Z5111 Encounter for antineoplastic chemotherapy: Secondary | ICD-10-CM | POA: Insufficient documentation

## 2019-09-15 DIAGNOSIS — Z170 Estrogen receptor positive status [ER+]: Secondary | ICD-10-CM | POA: Insufficient documentation

## 2019-09-15 DIAGNOSIS — D709 Neutropenia, unspecified: Secondary | ICD-10-CM | POA: Insufficient documentation

## 2019-09-15 LAB — CMP (CANCER CENTER ONLY)
ALT: 6 U/L (ref 0–44)
AST: 12 U/L — ABNORMAL LOW (ref 15–41)
Albumin: 3.6 g/dL (ref 3.5–5.0)
Alkaline Phosphatase: 62 U/L (ref 38–126)
Anion gap: 12 (ref 5–15)
BUN: 12 mg/dL (ref 6–20)
CO2: 22 mmol/L (ref 22–32)
Calcium: 9.4 mg/dL (ref 8.9–10.3)
Chloride: 107 mmol/L (ref 98–111)
Creatinine: 0.92 mg/dL (ref 0.44–1.00)
GFR, Est AFR Am: >60 mL/min (ref >60)
GFR, Estimated: >60 mL/min (ref >60)
Glucose, Bld: 95 mg/dL (ref 70–99)
Potassium: 3.1 mmol/L — ABNORMAL LOW (ref 3.5–5.1)
Sodium: 141 mmol/L (ref 135–145)
Total Bilirubin: 1.5 mg/dL — ABNORMAL HIGH (ref 0.3–1.2)
Total Protein: 6.4 g/dL — ABNORMAL LOW (ref 6.5–8.1)

## 2019-09-15 LAB — CBC WITH DIFFERENTIAL (CANCER CENTER ONLY)
Abs Immature Granulocytes: 0.02 10*3/uL (ref 0.00–0.07)
Basophils Absolute: 0 10*3/uL (ref 0.0–0.1)
Basophils Relative: 1 %
Eosinophils Absolute: 0.1 10*3/uL (ref 0.0–0.5)
Eosinophils Relative: 4 %
HCT: 32.2 % — ABNORMAL LOW (ref 36.0–46.0)
Hemoglobin: 11 g/dL — ABNORMAL LOW (ref 12.0–15.0)
Immature Granulocytes: 1 %
Lymphocytes Relative: 29 %
Lymphs Abs: 1.2 10*3/uL (ref 0.7–4.0)
MCH: 30.1 pg (ref 26.0–34.0)
MCHC: 34.2 g/dL (ref 30.0–36.0)
MCV: 88 fL (ref 80.0–100.0)
Monocytes Absolute: 0.4 10*3/uL (ref 0.1–1.0)
Monocytes Relative: 9 %
Neutro Abs: 2.3 10*3/uL (ref 1.7–7.7)
Neutrophils Relative %: 56 %
Platelet Count: 200 10*3/uL (ref 150–400)
RBC: 3.66 MIL/uL — ABNORMAL LOW (ref 3.87–5.11)
RDW: 12.7 % (ref 11.5–15.5)
WBC Count: 4 10*3/uL (ref 4.0–10.5)
nRBC: 0 % (ref 0.0–0.2)

## 2019-09-15 LAB — MAGNESIUM: Magnesium: 1.6 mg/dL — ABNORMAL LOW (ref 1.7–2.4)

## 2019-09-15 MED ORDER — SODIUM CHLORIDE 0.9 % IJ SOLN
10.0000 mL | INTRAMUSCULAR | Status: DC | PRN
  Administered 2019-09-15: 10 mL via INTRAVENOUS
  Filled 2019-09-15: qty 10

## 2019-09-15 MED ORDER — SODIUM CHLORIDE 0.9 % IV SOLN
10.0000 mg | Freq: Once | INTRAVENOUS | Status: AC
  Administered 2019-09-15: 10 mg via INTRAVENOUS
  Filled 2019-09-15: qty 10

## 2019-09-15 MED ORDER — ZOLPIDEM TARTRATE 10 MG PO TABS: 10.0000 mg | ORAL_TABLET | Freq: Every evening | ORAL | 0 refills | Status: DC | PRN

## 2019-09-15 MED ORDER — LORATADINE 10 MG PO TABS
ORAL_TABLET | ORAL | Status: AC
  Filled 2019-09-15: qty 1

## 2019-09-15 MED ORDER — PALONOSETRON HCL INJECTION 0.25 MG/5ML
INTRAVENOUS | Status: AC
  Filled 2019-09-15: qty 5

## 2019-09-15 MED ORDER — SODIUM CHLORIDE 0.9 % IV SOLN
15.0000 mg/kg | Freq: Once | INTRAVENOUS | Status: AC
  Administered 2019-09-15: 875 mg via INTRAVENOUS
  Filled 2019-09-15: qty 35

## 2019-09-15 MED ORDER — SODIUM CHLORIDE 0.9 % IV SOLN
Freq: Once | INTRAVENOUS | Status: AC
  Filled 2019-09-15: qty 250

## 2019-09-15 MED ORDER — PALONOSETRON HCL INJECTION 0.25 MG/5ML
0.2500 mg | Freq: Once | INTRAVENOUS | Status: AC
  Administered 2019-09-15: 0.25 mg via INTRAVENOUS

## 2019-09-15 MED ORDER — ACETAMINOPHEN 325 MG PO TABS
ORAL_TABLET | ORAL | Status: AC
  Filled 2019-09-15: qty 2

## 2019-09-15 MED ORDER — ACETAMINOPHEN 325 MG PO TABS
650.0000 mg | ORAL_TABLET | Freq: Once | ORAL | Status: AC
  Administered 2019-09-15: 650 mg via ORAL

## 2019-09-15 MED ORDER — LORATADINE 10 MG PO TABS
10.0000 mg | ORAL_TABLET | Freq: Once | ORAL | Status: AC
  Administered 2019-09-15: 10 mg via ORAL

## 2019-09-15 MED ORDER — SODIUM CHLORIDE 0.9% FLUSH
10.0000 mL | INTRAVENOUS | Status: DC | PRN
  Administered 2019-09-15: 10 mL
  Filled 2019-09-15: qty 10

## 2019-09-15 MED ORDER — SODIUM CHLORIDE 0.9 % IV SOLN
800.0000 mg/m2 | Freq: Once | INTRAVENOUS | Status: AC
  Administered 2019-09-15: 1330 mg via INTRAVENOUS
  Filled 2019-09-15: qty 34.98

## 2019-09-15 MED ORDER — HEPARIN SOD (PORK) LOCK FLUSH 100 UNIT/ML IV SOLN
500.0000 [IU] | Freq: Once | INTRAVENOUS | Status: AC | PRN
  Administered 2019-09-15: 500 [IU]
  Filled 2019-09-15: qty 5

## 2019-09-15 NOTE — Patient Instructions (Signed)
[facility] Discharge Instructions for Patients Receiving Chemotherapy  Today you received the following chemotherapy agents: gemcitabine, margetuximab Lissa Morales)  To help prevent nausea and vomiting after your treatment, we encourage you to take your nausea medication as prescribed by your physician.    If you develop nausea and vomiting that is not controlled by your nausea medication, call the clinic.   BELOW ARE SYMPTOMS THAT SHOULD BE REPORTED IMMEDIATELY:  *FEVER GREATER THAN 100.5 F  *CHILLS WITH OR WITHOUT FEVER  NAUSEA AND VOMITING THAT IS NOT CONTROLLED WITH YOUR NAUSEA MEDICATION  *UNUSUAL SHORTNESS OF BREATH  *UNUSUAL BRUISING OR BLEEDING  TENDERNESS IN MOUTH AND THROAT WITH OR WITHOUT PRESENCE OF ULCERS  *URINARY PROBLEMS  *BOWEL PROBLEMS  UNUSUAL RASH Items with * indicate a potential emergency and should be followed up as soon as possible.  Feel free to call the clinic should you have any questions or concerns. The clinic phone number is [phone].  Please show the [facility] at check-in to the Emergency Department and triage nurse.  Gemcitabine injection What is this medicine? GEMCITABINE (jem SYE ta been) is a chemotherapy drug. This medicine is used to treat many types of cancer like breast cancer, lung cancer, pancreatic cancer, and ovarian cancer. This medicine may be used for other purposes; ask your health care provider or pharmacist if you have questions. COMMON BRAND NAME(S): Gemzar, Infugem What should I tell my health care provider before I take this medicine? They need to know if you have any of these conditions:  blood disorders  infection  kidney disease  liver disease  lung or breathing disease, like asthma  recent or ongoing radiation therapy  an unusual or allergic reaction to gemcitabine, other chemotherapy, other medicines, foods, dyes, or preservatives  pregnant or trying to get  pregnant  breast-feeding How should I use this medicine? This drug is given as an infusion into a vein. It is administered in a hospital or clinic by a specially trained health care professional. Talk to your pediatrician regarding the use of this medicine in children. Special care may be needed. Overdosage: If you think you have taken too much of this medicine contact a poison control center or emergency room at once. NOTE: This medicine is only for you. Do not share this medicine with others. What if I miss a dose? It is important not to miss your dose. Call your doctor or health care professional if you are unable to keep an appointment. What may interact with this medicine?  medicines to increase blood counts like filgrastim, pegfilgrastim, sargramostim  some other chemotherapy drugs like cisplatin  vaccines Talk to your doctor or health care professional before taking any of these medicines:  acetaminophen  aspirin  ibuprofen  ketoprofen  naproxen This list may not describe all possible interactions. Give your health care provider a list of all the medicines, herbs, non-prescription drugs, or dietary supplements you use. Also tell them if you smoke, drink alcohol, or use illegal drugs. Some items may interact with your medicine. What should I watch for while using this medicine? Visit your doctor for checks on your progress. This drug may make you feel generally unwell. This is not uncommon, as chemotherapy can affect healthy cells as well as cancer cells. Report any side effects. Continue your course of treatment even though you feel ill unless your doctor tells you to stop. In some cases, you may be given additional medicines to help with side effects. Follow  all directions for their use. Call your doctor or health care professional for advice if you get a fever, chills or sore throat, or other symptoms of a cold or flu. Do not treat yourself. This drug decreases your body's  ability to fight infections. Try to avoid being around people who are sick. This medicine may increase your risk to bruise or bleed. Call your doctor or health care professional if you notice any unusual bleeding. Be careful brushing and flossing your teeth or using a toothpick because you may get an infection or bleed more easily. If you have any dental work done, tell your dentist you are receiving this medicine. Avoid taking products that contain aspirin, acetaminophen, ibuprofen, naproxen, or ketoprofen unless instructed by your doctor. These medicines may hide a fever. Do not become pregnant while taking this medicine or for 6 months after stopping it. Women should inform their doctor if they wish to become pregnant or think they might be pregnant. Men should not father a child while taking this medicine and for 3 months after stopping it. There is a potential for serious side effects to an unborn child. Talk to your health care professional or pharmacist for more information. Do not breast-feed an infant while taking this medicine or for at least 1 week after stopping it. Men should inform their doctors if they wish to father a child. This medicine may lower sperm counts. Talk with your doctor or health care professional if you are concerned about your fertility. What side effects may I notice from receiving this medicine? Side effects that you should report to your doctor or health care professional as soon as possible:  allergic reactions like skin rash, itching or hives, swelling of the face, lips, or tongue  breathing problems  pain, redness, or irritation at site where injected  signs and symptoms of a dangerous change in heartbeat or heart rhythm like chest pain; dizziness; fast or irregular heartbeat; palpitations; feeling faint or lightheaded, falls; breathing problems  signs of decreased platelets or bleeding - bruising, pinpoint red spots on the skin, black, tarry stools, blood in  the urine  signs of decreased red blood cells - unusually weak or tired, feeling faint or lightheaded, falls  signs of infection - fever or chills, cough, sore throat, pain or difficulty passing urine  signs and symptoms of kidney injury like trouble passing urine or change in the amount of urine  signs and symptoms of liver injury like dark yellow or brown urine; general ill feeling or flu-like symptoms; light-colored stools; loss of appetite; nausea; right upper belly pain; unusually weak or tired; yellowing of the eyes or skin  swelling of ankles, feet, hands Side effects that usually do not require medical attention (report to your doctor or health care professional if they continue or are bothersome):  constipation  diarrhea  hair loss  loss of appetite  nausea  rash  vomiting This list may not describe all possible side effects. Call your doctor for medical advice about side effects. You may report side effects to FDA at 1-800-FDA-1088. Where should I keep my medicine? This drug is given in a hospital or clinic and will not be stored at home. NOTE: This sheet is a summary. It may not cover all possible information. If you have questions about this medicine, talk to your doctor, pharmacist, or health care provider.  2020 [employer] Standard (2017-03-24 18:06:11)

## 2019-09-15 NOTE — Patient Instructions (Signed)

## 2019-09-16 ENCOUNTER — Encounter: Payer: Self-pay | Admitting: Hematology

## 2019-09-16 LAB — CANCER ANTIGEN 27.29: CA 27.29: 49.1 U/mL — ABNORMAL HIGH (ref 0.0–38.6)

## 2019-09-19 ENCOUNTER — Telehealth: Payer: Self-pay | Admitting: Hematology

## 2019-09-19 NOTE — Telephone Encounter (Signed)
Scheduled appointments per 9/3 los. Attempted to contact patient through phone number on file. There was no answer and no voicemail available. I have sent a calendar to patient's address on file with updated appointments.

## 2019-09-20 NOTE — Progress Notes (Signed)
[name]   [phone] [phone]   Clinic Follow up Note   Patient Care Team: [name], [name], [name] as PCP - General (Physician Assistant) [name], MD as Consulting Physician (Orthopedic Surgery)  Date of Service:  09/22/2019  CHIEF COMPLAINT: F/u ofmetastatic breast cancer  SUMMARY OF ONCOLOGIC HISTORY: Oncology History Overview Note   Cancer Staging No matching staging information was found for the patient.     [name], right ([place])  11/1997 Cancer Diagnosis   Patient had 1.4 cm poorly differentiated right breast carcinoma diagnosed in Nov 1999 at age 54, 1/7 axillary nodes involved, ER/PR and HER 2 positive. She had mastectomy with the 7 axillary node evaluation, 4 cycles of adriamycin/ cytoxan followed by taxotere, then five years of tamoxifen thru June 2005 (no herceptin in 1999). She had bilateral oophorectomy in May 2005. She was briefly on aromatase inhibitor after tamoxifen, but discontinued this herself due to poor tolerance.   04/04/2015 Genetic Testing   Negative genetic testing on the breast/ovarian cancer pnael.  The Breast/Ovarian gene panel offered by [facility] includes sequencing and rearrangement analysis for the following 20 genes:  ATM, BARD1, BRCA1, BRCA2, BRIP1, CDH1, CHEK2, EPCAM, FANCC, MLH1, MSH2, MSH6, NBN, PALB2, PMS2, PTEN, RAD51C, RAD51D, TP53, and XRCC2.   06/26/2015 Pathology Results   Initial Path Report Bone, curettage, Left femur met, intramedullary subtroch tissue - METASTATIC ADENOCARCINOMA.  Estrogen Receptor: 95%, POSITIVE, STRONG STAINING INTENSITY Progesterone Receptor: 2%, POSITIVE, STRONG  HER2 (+), IHC 3+   06/26/2015 Surgery   Biopsy of metastatic tissue and stabilization with Affixus trochanteric nail, proximal and distal interlock of left femur by Dr. [name]    06/28/2015 Imaging   CT Chest w/ Contrast 1. Extensive right internal mammary chain  lymphadenopathy consistent with metastatic breast cancer. 2. Lytic metastases involving the posterior elements at T1 with probable nondisplaced pathologic fracture of the spinous process. 3. No other evidence of thoracic metastatic disease. 4. Trace bilateral pleural effusions with associated bibasilar atelectasis. 5. Indeterminate right thyroid nodule, not previously imaged. This could be evaluated with thyroid ultrasound as clinically warranted.   06/29/2015 -  Chemotherapy   Zometa started monthly on 06/29/15 and switched to q49month from 01/08/17   07/01/2015 Imaging   Bone Scan 1. Increased activity noted throughout the left femur. Although metastatic disease cannot be excluded, these changes are most likely secondary to prior surgery. 2. No other focal abnormalities identified to suggest metastatic disease.   07/12/2015 - 07/26/2015 Radiation Therapy   Left femur, 30 Gy in 10 fractions by Dr. [name]   07/14/2015 Initial Diagnosis   [name]   07/19/2015 Imaging   Initial PET Scan 1. Severe multifocal osseous metastatic disease, much greater than anticipated based on prior imaging. 2. Multifocal nodal metastases involving the right internal mammary, prevascular and subpectoral lymph nodes. No axillary pulmonary involvement identified. 3. No distant extra osseous metastases.   07/30/2015 - 11/29/2015 Chemotherapy   Docetaxel, Herceptin and Perjeta every 3 weeks X6 cycles, pt tolerated moderately well, restaging scan showed excellent response    09/18/2015 Imaging   CT Head w/wo Contrast 1. No acute intracranial abnormality or significant interval change. 2. Stable minimal periventricular white matter hypoattenuation on the right. This may reflect a remote ischemic injury. 3. No evidence for metastatic disease to the brain. 4. No focal soft tissue lesion to explain the patient's tenderness.   10/30/2015 Imaging   CT  Abdomen Pelvis w/  Contrast 1. Few mildly prominent fluid-filled loops of small bowel scattered throughout the abdomen, with intraluminal fluid density within the distal colon. Given the provided history, findings are suggestive of acute enteritis/diarrheal illness. 2. No other acute intra-abdominal or pelvic process identified. 3. Widespread osseous metastatic disease, better evaluated on most recent PET-CT from 07/19/2015. No pathologic fracture or other complication. 4. 11 mm hypodensity within the left kidney. This lesion measures intermediate density, and is indeterminate. While this lesion is similar in size relative to recent studies, this is increased in size relative to prior study from 2012. Further evaluation with dedicated renal mass protocol CT and/or MRI is recommended for complete characterization.   12/2015 - 05/14/2016 Chemotherapy   Maintenance Herceptin and pejeta every 3 weeks stopped on 05/14/16 due to disease progression   12/25/2015 Imaging   Restaging PET Scan 1. Markedly improved skeletal activity, with only several faint foci of residual accentuated metabolic activity, but resolution of the vast majority of the previously extensive osseous metastatic disease. 2. Resolution of the prior right internal mammary and right prevascular lymph nodes. 3. Residual subcutaneous edema and edema along fascia planes in the left upper thigh. This remains somewhat more than I would expect for placement of an IM nail 6 weeks ago. If there is leg swelling further down, consider Doppler venous ultrasound to rule out left lower extremity DVT. I do not perceive an obvious difference in density between the pelvic and common femoral veins on the noncontrast CT data. 4. Chronic right maxillary and right sphenoid sinusitis.   01/16/2016 - 10/20/2017 Anti-estrogen oral therapy   Letrozole 2.5 mg daily. She was switched to exemestane due to disease progression on 02/08/17. Stopped on 10/20/2017 when treatment changed to chemo     03/01/2016 Miscellaneous   Patient presented to ED following a fall; she reports she landed on her back and is now experiencing back pain. The patient was evaluated and discharge home same day with pain control.   05/01/2016 Imaging   CT CAP IMPRESSION: 1. Stable exam.  No new or progressive disease identified. 2. No evidence for residual or recurrent adenopathy within the chest. 3. Stable bone metastasis.   05/01/2016 Imaging   BONE SCAN IMPRESSION: 1. Small focal uptake involving the anterior rib ends of the left second and right fourth ribs, new since the prior bone scan. The location of this uptake is more suggestive of a traumatic or inflammatory etiology as opposed to metastatic disease. No other evidence of metastatic disease. 2. There are other areas of stable uptake which are most likely degenerative/reactive, including right shoulder and cervical spine uptake and uptake along the right femur adjacent to the ORIF hardware.   05/20/2016 Imaging   NM PET Skull to Thigh IMPRESSION: 1. There multiple metastatic foci in the bony pelvis, including some new abnormal foci compare to the prior PET-CT, compatible with mildly progressive bony metastatic disease. Also, a left T11 vertebral hypermetabolic metastatic lesion is observed, new compared to the prior exam. 2. No active extraosseous malignancy is currently identified. 3. Right anterior fourth rib healing fracture, appears be   06/04/2016 - 01/08/2017 Chemotherapy   Second line chemotherapy Kadcyla every 3 weeks started on 06/04/16 and stopped on 01/08/17 due to mixed response.     10/05/2016 Imaging   CT CAP and Bone Scan:  Bones: left intramedullary rod and left femoral head neck screw in place. In the proximal diaphysis of the left femur there is cortical thickening and irregularity.  No lytic or blastic bone lesions. No fracture or vertebral endplate destruction.   No definite evidence of recurrent disease in the  chest, abdomen, or pelvis.   10/28/2016 Imaging   CT A/P IMPRESSION: No acute process demonstrated in the abdomen or pelvis. No evidence of bowel obstruction or inflammation.   11/04/2016 Imaging   DG foot complete left: IMPRESSION: No fracture or dislocation.  No soft tissue abnormality   11/23/2016 Imaging   CT RIGHT FEMUR IMPRESSION: Negative CT scan of the right thigh.  No visible metastatic disease.  CT LEFT FEMUR IMPRESSION: 1. Postsurgical changes related to prior cephalomedullary rod fixation of the left femur with linear lucency along the anterolateral proximal femoral cortex at level of the lesser trochanter, suspicious for nondisplaced fracture. 2. Slightly permeative appearance of the proximal left femoral cortex at the site of known prior osseous metastases is largely unchanged. 3. Unchanged patchy lucency and sclerosis along the anterior acetabulum, which appears to correspond with an area of faint uptake on the prior PET-CT, suspicious for osseous metastasis. These results will be called to the ordering clinician or representative by the Radiologist Assistant, and communication documented in the PACS or zVision Dashboard.     01/27/2017 Imaging   IMPRESSION: 1. Mixed response to osseous metastasis. Some hypermetabolic lesions are new and increasingly hypermetabolic, while another index lesion is decreasingly hypermetabolic. 2. No evidence of hypermetabolic soft tissue metastasis.    02/08/2017 - 10/19/2017 Antibody Plan   -Herceptin every 3 week since 02/08/17 -Oral Lanpantinib 1578m and decreased to 5044mdue to diarrhea and depression. Due to disease progression she swithced to Verzenio 15063mn 05/04/17. She did not toelrate well so she was switched to Ibrance 100m43m 05/31/17. Due to her neutropenia, Ibrance dose reduced to 75 mg daily, 3 weeks on, one-week off, stopped on 10/20/2017 due to disease progression.    04/30/2017 PET scan   IMPRESSION: 1.  Primarily increase in hypermetabolism of osseous metastasis. An isolated right acetabular lesion is decreased in hypermetabolism since the prior exam. 2. No evidence of hypermetabolic soft tissue metastasis. 3. Similar non FDG avid right-sided thyroid nodule, favoring a benign etiology. Recommend attention on follow-up.   08/30/2017 PET scan   08/30/2017 PET Scan  IMPRESSION: 1. Worsening osseous metastatic disease. 2. Focal hypermetabolism in the central spinal canal the L1 level, similar to the prior exam. Metastatic implant cannot be excluded. 3. Slight enlargement of a right thyroid nodule which now appears more cystic in character. Consider further evaluation with thyroid ultrasound. If patient is clinically hyperthyroid, consider nuclear medicine thyroid uptake and scan   08/30/2017 Progression   08/30/2017 PET Scan  IMPRESSION: 1. Worsening osseous metastatic disease. 2. Focal hypermetabolism in the central spinal canal the L1 level, similar to the prior exam. Metastatic implant cannot be excluded. 3. Slight enlargement of a right thyroid nodule which now appears more cystic in character. Consider further evaluation with thyroid ultrasound. If patient is clinically hyperthyroid, consider nuclear medicine thyroid uptake and scan   09/29/2017 - 10/11/2017 Radiation Therapy   Pt received 27 Gy in 9 fractions directed at the areas of significant uptake noted on recent bone scan the right pelvis and right proximal femur, with Dr, KinaSondra Come10/09/2017 - 04/07/2018 Chemotherapy   -Herceptin every 3 weeks starting 02/08/17, perjeta added on 10/20/2017  -weekly Taxol started on 10/21/2017. Will reduce Taxol to 2 weeks on, 1 week off starting on 02/04/17.  Stoped due to mild disease progression   10/27/2017  Imaging   10/27/2017 CT AP IMPRESSION: 1. No acute abnormality. No evidence of bowel obstruction or acute bowel inflammation. Normal appendix. 2. Stable patchy sclerotic osseous  metastases throughout the visualized skeleton. No new or progressive metastatic disease in the abdomen or pelvis.   01/11/2018 Imaging   Whole Body Bone Scan 01/11/18  IMPRESSION: 1. Multifocal osseous metastases as described. 2. Right scapular metastasis. 3. Right superior thoracic spinous process. 4. Right sacral ala and right L5 metastases. 5. Right acetabular metastasis. 6. Right proximal femur metastasis. 7. Diffuse metastases throughout the left femur. 8. Anterior right fourth rib metastasis.   01/11/2018 Imaging   CT CAP W Contrast 01/11/18  IMPRESSION: 1. Similar-appearing osseous metastatic disease. 2. No evidence for progressed metastatic disease in the chest, abdomen or pelvis.   03/30/2018 Imaging   Bone Scan 03/30/18  IMPRESSION: Stable multifocal bony metastatic disease.   03/30/2018 Imaging    CT CAP W Contrast 03/30/18 IMPRESSION: 1. New mild contiguous wall thickening with associated mucosal hyperenhancement throughout the left colon and rectum, suggesting nonspecific infectious or inflammatory proctocolitis. Differential includes C diff colitis. 2. Stable faint patchy sclerosis in the iliac bones and lumbar vertebral bodies. No new or progressive osseous metastatic disease by CT. Please note that the osseous lesions are relatively occult by CT and would be better evaluated by PET-CT, as clinically warranted. 3. No evidence of extra osseous metastatic disease.  These results will be called to the ordering clinician or representative by the Radiologist Assistant, and communication documented in the PACS or zVision Dashboard.   04/15/2018 - 06/16/2018 Chemotherapy   Enhertu q3weeks starting 04/15/18. Stopped Enhertu after cycle 3 on 05/27/18 due to poor toleration due to poor toleration with anorexia, weight loss, N&V, diarrhea   04/21/2018 PET scan   PET 04/21/18  IMPRESSION: 1. Overall marked interval improvement in bony hypermetabolic disease. All of the  previously identified hypermetabolic osseous metastases have decreased in hypermetabolism in the interval. There is a new tiny focus of hypermetabolism in the right aspect of the L3 vertebral body that appears to correspond to a new 8 mm lucency, raising concern for new metastatic disease. Close attention on follow-up recommended. 2. Focal hypermetabolism in the central spinal canal at the level of L1 previously has resolved. 3. No hypermetabolic soft tissue disease on today's study. 4. Right thyroid nodule seen previously has decreased in the interval.    06/16/2018 Imaging   CT LUMBAR SPINE WO CONTRAST 06/16/18 IMPRESSION: 1. Transitional lumbosacral anatomy. Correlation with radiographs is recommended prior to any operative intervention. 2. Suspicion of a right L3 vertebral body metastasis on the April PET-CT is not evident by CT. Stable degenerative versus metastatic lesion at the right L5 superior endplate. Small but increased right iliac bone metastasis since March. Lumbar MRI would best characterize lumbar metastatic disease (without contrast if necessary). 3. Suspect L2-L3 left subarticular segment disc herniation. Query left L2 radiculitis. 4. Stable appearance of rightward L4-L5 disc degeneration which could affect the right foramen and right lateral recess.   06/17/2018 - 11/16/2018 Chemotherapy   Restart Taxol 2 weeks on/1 week off with Herceptin and Perjeta q3weeks on 06/17/18. Perjeta was stopped after C5 due to diarrhea. Stopped Taxol after 11/16/18 due to disease progression to L3.    07/06/2018 PET scan   PET  IMPRESSION: 1. Near total resolution of hypermetabolic activity associated with skeletal metastasis. Very mild activity associated with the LEFT iliac bone which is similar to background blood pool activity. 2. No new or  progressive disease.   11/21/2018 PET scan   IMPRESSION: 1. New focal hypermetabolic lesion eccentric to the right in the L3 vertebral body,  maximum SUV 6.1 compatible with a new focus of active malignancy. 2. Very low-grade activity similar to blood pool in previous existing mildly sclerotic metastatic lesions including the left iliac bone lesion. Some of the mildly sclerotic lesions are not hypermetabolic compatible with effectively treated bony metastatic disease. 3. No hypermetabolic soft tissue lesions are identified.     11/30/2018 - 08/25/2019 Chemotherapy   Continue Herceptin q3weeks. Stopped 08/25/19 due to significant disease progression and switched to  IV Matuzumab   12/12/2018 - 12/17/2018 Chemotherapy   Tucatinib (TUKYSA) 6 tabs BID as a next line therapy starting 12/12/18. Stopped 12/17/18 [name] o N&V, poor appetite and HA    12/21/2018 - 03/17/2019 Chemotherapy   IV Eribulin on day 1, 8 every 21 days starting 12/21/18. Stopped 03/17/19 due to mixed response to treatment.    03/21/2019 PET scan   IMPRESSION: 1. Mixed response to therapy of osseous metastasis. Although a right-sided L4 lesion demonstrates decreased hypermetabolism today, new left iliac and right ischial hypermetabolic lesions are seen. 2. No evidence of hypermetabolic soft tissue metastasis.     03/27/2019 - 07/21/2019 Chemotherapy   Neratinib (Nerlynx) titrate up from 181m to 2470mstarting 03/27/19. She could not tolerate 16034mnd stopped on 02/05/19 due to N&V and diarrhea. Will restart at 120m13m 04/10/18-04/27/19 and 4/19-4/22. Reduced to 80mg47me daily on 05/10/19. Held/stopped since 07/21/19 due to dry cough, Nausea and body aches.   06/23/2019 Imaging   CT Head  IMPRESSION: 1. No acute intracranial abnormality or metastatic disease identified. 2. Chronic white matter disease in the right hemisphere is stable from prior exams.     09/05/2019 PET scan   IMPRESSION: 1. Significant progression of widespread hypermetabolic osseous metastases throughout the axial skeleton as detailed. 2. No hypermetabolic extraosseous sites of metastatic disease.    09/15/2019 -  Chemotherapy   IV Matuzumab q3weeks and IV Gemcitabine 2 weeks on/1 week off starting 09/15/19      CURRENT THERAPY:  -Zometa q3mont20monthMatuzumab q3weeks and IV Gemcitabine2 weeks on/1 week off starting 09/15/19  INTERVAL HISTORY:  Debra LILLYMAE DUETre for a follow up. She presents to the clinic with her interpretor. She notes she tolerated day 1-3 well then was had nausea and fatigue for day 4-5. Antiemetics helped but she did not have diarrhea. She has little taste change but able to eat the same. She is taking potassium TID.    REVIEW OF SYSTEMS:   Constitutional: Denies fevers, chills or abnormal weight loss (+) fatigue  Eyes: Denies blurriness of vision Ears, nose, mouth, throat, and face: Denies mucositis or sore throat Respiratory: Denies cough, dyspnea or wheezes Cardiovascular: Denies palpitation, chest discomfort or lower extremity swelling Gastrointestinal:  Denies nausea, heartburn or change in bowel habits Skin: Denies abnormal skin rashes Lymphatics: Denies new lymphadenopathy or easy bruising Neurological:Denies numbness, tingling or new weaknesses Behavioral/Psych: Mood is stable, no new changes  All other systems were reviewed with the patient and are negative.  MEDICAL HISTORY:  Past Medical History:  Diagnosis Date  . Abnormal Pap smear   . Anxiety   . Breast cancer (HCC)  [place] [place]   breast-s/p mastectomy, chemo, rad  . CIN I (cervical intraepithelial neoplasia I) 2003   by colpo  . Congenital deafness   . Deaf   . Depression   .  Port-A-Cath in place 2017   for chemotherapy  . Radiation 07/12/15-07/26/15   left femur 30 Gy  . S/P bilateral oophorectomy     SURGICAL HISTORY: Past Surgical History:  Procedure Laterality Date  . ABDOMINAL HYSTERECTOMY    . INTRAMEDULLARY (IM) NAIL INTERTROCHANTERIC Left 06/26/2015   Procedure: LEFT BIOMET LONG AFFIXS NAIL;  Surgeon: [name], MD;  Location: [facility];  Service:  Orthopedics;  Laterality: Left;  . IR GENERIC HISTORICAL  08/06/2015   IR US GUIDE VASC ACCESS LEFT 08/06/2015 Aletta Edouard, MD [facility]  . IR GENERIC HISTORICAL  08/06/2015   IR FLUORO GUIDE CV LINE LEFT 08/06/2015 Aletta Edouard, MD [facility]  . IR GENERIC HISTORICAL  03/05/2016   IR CV LINE INJECTION 03/05/2016 Markus Daft, MD [facility]  . LEFT HEART CATH AND CORONARY ANGIOGRAPHY N/A 12/13/2017   Procedure: LEFT HEART CATH AND CORONARY ANGIOGRAPHY;  Surgeon: Leonie Man, MD;  Location: [facility];  Service: Cardiovascular;  Laterality: N/A;  . MASTECTOMY     right breast  . MASTECTOMY    . OVARIAN CYST REMOVAL    . RADIOLOGY WITH ANESTHESIA Left 06/25/2015   Procedure: MRI OF LEFT HIP WITH OR WITHOUT CONTRAST;  Surgeon: Medication Radiologist, MD;  Location: [facility];  Service: Radiology;  Laterality: Left;  DR. MCINTYRE/MRI  . TUBAL LIGATION      I have reviewed the social history and family history with the patient and they are unchanged from previous note.  ALLERGIES:  is allergic to promethazine hcl, chlorhexidine, and diphenhydramine hcl.  MEDICATIONS:  Current Outpatient Medications  Medication Sig Dispense Refill  . ALPRAZolam (XANAX) 0.25 MG tablet Take 1 tablet (0.25 mg total) by mouth 2 (two) times daily as needed for anxiety. 30 tablet 0  . diphenoxylate-atropine (LOMOTIL) 2.5-0.025 MG tablet Take 1-2 tablets by mouth 4 (four) times daily as needed for diarrhea or loose stools. 60 tablet 1  . guaiFENesin-codeine 100-10 MG/5ML syrup Take 5 mLs by mouth every 4 (four) hours as needed for cough. 180 mL 0  . lidocaine-prilocaine (EMLA) cream Apply 1 application topically as needed. Apply to Porta-Cath 1-2 hours prior to access as directed. 90 g 2  . ondansetron (ZOFRAN) 8 MG tablet Take 1 tablet (8 mg total) by mouth every 8 (eight) hours as needed for nausea or vomiting. 45 tablet 1  . oxyCODONE (ROXICODONE) 5 MG immediate release tablet Take 1 tablet (5 mg  total) by mouth every 8 (eight) hours as needed for severe pain. 60 tablet 0  . potassium chloride (KLOR-CON) 10 MEQ tablet Take 3 tablets (30 mEq total) by mouth daily. 90 tablet 2  . prochlorperazine (COMPAZINE) 10 MG tablet Take 1 tablet (10 mg total) by mouth 2 (two) times daily as needed for nausea or vomiting. 10 tablet 0  . zolpidem (AMBIEN) 10 MG tablet Take 1 tablet (10 mg total) by mouth at bedtime as needed for sleep. 30 tablet 0   No current facility-administered medications for this visit.   Facility-Administered Medications Ordered in Other Visits  Medication Dose Route Frequency Provider Last Rate Last Admin  . potassium chloride SA (K-DUR) CR tablet 40 mEq  40 mEq Oral Once Truitt Merle, MD      . sodium chloride 0.9 % 1,000 mL with potassium chloride 10 mEq infusion   Intravenous Continuous Gordy Levan, MD   Stopped at 09/10/15 1653  . sodium chloride 0.9 % injection 10 mL  10 mL Intravenous PRN  Gordy Levan, MD        PHYSICAL EXAMINATION: ECOG PERFORMANCE STATUS: 2 - Symptomatic, <50% confined to bed  Vitals:   09/22/19 0840  BP: 113/74  Pulse: 74  Resp: 18  Temp: 97.8 F (36.6 C)  SpO2: 100%   Filed Weights   09/22/19 0840  Weight: 131 lb 11.2 oz (59.7 kg)    Due to COVID19 we will limit examination to appearance. Patient had no complaints.  GENERAL:alert, no distress and comfortable SKIN: skin color normal, no rashes or significant lesions EYES: normal, Conjunctiva are pink and non-injected, sclera clear  NEURO: alert & oriented x 3 with fluent speech   LABORATORY DATA:  I have reviewed the data as listed CBC Latest Ref Rng & Units 09/22/2019 09/15/2019 08/25/2019  WBC 4.0 - 10.5 K/uL 2.4(L) 4.0 4.8  Hemoglobin 12.0 - 15.0 g/dL 10.7(L) 11.0(L) 10.4(L)  Hematocrit 36 - 46 % 31.2(L) 32.2(L) 30.8(L)  Platelets 150 - 400 K/uL 117(L) 200 190     CMP Latest Ref Rng & Units 09/22/2019 09/15/2019 08/25/2019  Glucose 70 - 99 mg/dL 89 95 111(H)  BUN 6 - 20  mg/dL _0 Creatinine 0.44 - 1.00 mg/dL 0.87 0.92 1.08(H)  Sodium 135 - 145 mmol/L 141 141 141  Potassium 3.5 - 5.1 mmol/L 3.4(L) 3.1(L) 3.2(L)  Chloride 98 - 111 mmol/L 106 107 107  CO2 22 - 32 mmol/L _1 Calcium 8.9 - 10.3 mg/dL 9.2 9.4 9.4  Total Protein 6.5 - 8.1 g/dL 6.7 6.4(L) 6.1(L)  Total Bilirubin 0.3 - 1.2 mg/dL 1.9(H) 1.5(H) 1.5(H)  Alkaline Phos 38 - 126 U/L 59 62 63  AST 15 - 41 U/L 15 12(L) 9(L)  ALT 0 - 44 U/L _2 RADIOGRAPHIC STUDIES: I have personally reviewed the radiological images as listed and agreed with the findings in the report. No results found.   ASSESSMENT & PLAN:  KARRISA DIDIO is a 54 y.o. 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 She had right mastectomy, 4 cycles of AC-T and 5 years of Tamoxifen/AI.  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  -After her first line of chemo treatment and initial maintenance therapy, she progressed or had poor toleration of multiple lines of treatment since then. Latest in 08/2019.  -I previously discussed after multiple treatments tried, her remaining options are fewer. I recommended next-line (7th line) with anti-HER2IV Margetuximabq3weeks with IV Chemo weekly Gemcitabine 2 weeks on/1 week off. She started on 09/15/19. -She tolerated her first week of current treatment mostly well with a few days of nausea, fatigue and 1 day of body aches.  -Labs reviewed, WBC 2.4, Hg 10.7, plt 117, ANC 1.2. Overall adequate to proceed with C1D8 Gemcitabine today. Will give Onpro given her neutropenia.  -F/u in 2 weeks for C2.    2.Body pain, secondary to bone mets, Hypocalcemia, leg cramps  -She is being treated with Zometa q38month.  -She will continue oral calcium and Vit Dand Prilosec for Acid Reflux. -Current pain includes, lower back, upper legs, hips and  right shoulder.PET from8/24/21does indicate disease progression in her spine, pelvis and right scapula. -Pain is currently controlled on 521mOxycodone1-2 tabs daily.  -I started her on next line treatment (09/15/19) to control disease and help pain. If pain not improving fast enough,  I may refer her for palliative RT. -Stable   3.Congenital deafness  4.Mild anemia, secondary to chemotherapy and malignancy  -Stable   5.Hypokalemia, Hypomagnesium, secondary toDiarrhea -She developed diarrhea s/p cycle 3 Perjeta and again with Nerlynx, Both have been D/c. -She is onOral potassium 13mq3 times a day.She stopped oral Mag because it made her nauseous.  -She can take more potassium when she has diarrhea.   6.Goal of care discussion, DNR/DNI -We previously reviewed goal of care and I recommend DNR/DNI, sheagreed.  -I encouraged her to start setting up her living will. Will refer her to hospice home care if she is no longer on any treatment.   7.Insomnia -She cannot take Benadryl due to allergy reaction. She has tried trazodone with no relief.  -She is on Xanax and Ambien 1-2 tabs nightly. She knows not to take both at the same time.  -Her insurance is only covering 15 tablets a month of Ambien 564m I now prescribe Ambien at 1056mo use half tablets moving forward. She agreed.   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 Stable  9. Mildhyperbilirubinemia  -likely second to chemotherapy, fluctuates  -Will monitor closely   PLAN: -Labs reviewed and adequate to proceed with C1D8. Give Onpro this cycle for neutropenia  -Lab, flush, F/u and Margetuximab and Gemcitabine in 2 weeks  -Lab, flush, and Day 8 Gemcitabine in 3 weeks  -Zometa infusion in 2 weeks    No problem-specific Assessment & Plan notes found for this encounter.   No orders of the defined types were placed in this encounter.  All questions were answered. The patient knows to call the clinic with any  problems, questions or concerns. No barriers to learning was detected. The total time spent in the appointment was 30 minutes.     YanTruitt MerleD 09/22/2019   I, AmoJoslyn Devonm acting as scribe for YanTruitt MerleD.   I have reviewed the above documentation for accuracy and completeness, and I agree with the above.

## 2019-09-22 ENCOUNTER — Inpatient Hospital Stay: Payer: Medicaid Other

## 2019-09-22 ENCOUNTER — Other Ambulatory Visit: Payer: Self-pay

## 2019-09-22 ENCOUNTER — Inpatient Hospital Stay (HOSPITAL_BASED_OUTPATIENT_CLINIC_OR_DEPARTMENT_OTHER): Payer: Medicaid Other | Admitting: Hematology

## 2019-09-22 ENCOUNTER — Encounter: Payer: Self-pay | Admitting: Hematology

## 2019-09-22 VITALS — BP 113/74 | HR 74 | Temp 97.8°F | Resp 18 | Ht 65.0 in | Wt 131.7 lb

## 2019-09-22 DIAGNOSIS — C7951 Secondary malignant neoplasm of bone: Secondary | ICD-10-CM

## 2019-09-22 DIAGNOSIS — C50911 Malignant neoplasm of unspecified site of right female breast: Secondary | ICD-10-CM

## 2019-09-22 DIAGNOSIS — Z5112 Encounter for antineoplastic immunotherapy: Secondary | ICD-10-CM | POA: Diagnosis not present

## 2019-09-22 LAB — CBC WITH DIFFERENTIAL (CANCER CENTER ONLY)
Abs Immature Granulocytes: 0.03 10*3/uL (ref 0.00–0.07)
Basophils Absolute: 0 10*3/uL (ref 0.0–0.1)
Basophils Relative: 1 %
Eosinophils Absolute: 0.1 10*3/uL (ref 0.0–0.5)
Eosinophils Relative: 2 %
HCT: 31.2 % — ABNORMAL LOW (ref 36.0–46.0)
Hemoglobin: 10.7 g/dL — ABNORMAL LOW (ref 12.0–15.0)
Immature Granulocytes: 1 %
Lymphocytes Relative: 42 %
Lymphs Abs: 1 10*3/uL (ref 0.7–4.0)
MCH: 29.8 pg (ref 26.0–34.0)
MCHC: 34.3 g/dL (ref 30.0–36.0)
MCV: 86.9 fL (ref 80.0–100.0)
Monocytes Absolute: 0.1 10*3/uL (ref 0.1–1.0)
Monocytes Relative: 5 %
Neutro Abs: 1.2 10*3/uL — ABNORMAL LOW (ref 1.7–7.7)
Neutrophils Relative %: 49 %
Platelet Count: 117 10*3/uL — ABNORMAL LOW (ref 150–400)
RBC: 3.59 MIL/uL — ABNORMAL LOW (ref 3.87–5.11)
RDW: 12.1 % (ref 11.5–15.5)
WBC Count: 2.4 10*3/uL — ABNORMAL LOW (ref 4.0–10.5)
nRBC: 0 % (ref 0.0–0.2)

## 2019-09-22 LAB — CMP (CANCER CENTER ONLY)
ALT: 18 U/L (ref 0–44)
AST: 15 U/L (ref 15–41)
Albumin: 3.7 g/dL (ref 3.5–5.0)
Alkaline Phosphatase: 59 U/L (ref 38–126)
Anion gap: 10 (ref 5–15)
BUN: 10 mg/dL (ref 6–20)
CO2: 25 mmol/L (ref 22–32)
Calcium: 9.2 mg/dL (ref 8.9–10.3)
Chloride: 106 mmol/L (ref 98–111)
Creatinine: 0.87 mg/dL (ref 0.44–1.00)
GFR, Est AFR Am: >60 mL/min (ref >60)
GFR, Estimated: >60 mL/min (ref >60)
Glucose, Bld: 89 mg/dL (ref 70–99)
Potassium: 3.4 mmol/L — ABNORMAL LOW (ref 3.5–5.1)
Sodium: 141 mmol/L (ref 135–145)
Total Bilirubin: 1.9 mg/dL — ABNORMAL HIGH (ref 0.3–1.2)
Total Protein: 6.7 g/dL (ref 6.5–8.1)

## 2019-09-22 MED ORDER — PEGFILGRASTIM 6 MG/0.6ML ~~LOC~~ PSKT
PREFILLED_SYRINGE | SUBCUTANEOUS | Status: AC
  Filled 2019-09-22: qty 0.6

## 2019-09-22 MED ORDER — PROCHLORPERAZINE MALEATE 10 MG PO TABS
ORAL_TABLET | ORAL | Status: AC
  Filled 2019-09-22: qty 1

## 2019-09-22 MED ORDER — SODIUM CHLORIDE 0.9% FLUSH
10.0000 mL | INTRAVENOUS | Status: DC | PRN
  Administered 2019-09-22: 10 mL
  Filled 2019-09-22: qty 10

## 2019-09-22 MED ORDER — SODIUM CHLORIDE 0.9 % IV SOLN
Freq: Once | INTRAVENOUS | Status: AC
  Filled 2019-09-22: qty 250

## 2019-09-22 MED ORDER — HEPARIN SOD (PORK) LOCK FLUSH 100 UNIT/ML IV SOLN
500.0000 [IU] | Freq: Once | INTRAVENOUS | Status: AC | PRN
  Administered 2019-09-22: 500 [IU]
  Filled 2019-09-22: qty 5

## 2019-09-22 MED ORDER — SODIUM CHLORIDE 0.9 % IV SOLN
800.0000 mg/m2 | Freq: Once | INTRAVENOUS | Status: AC
  Administered 2019-09-22: 1330 mg via INTRAVENOUS
  Filled 2019-09-22: qty 34.98

## 2019-09-22 MED ORDER — PEGFILGRASTIM 6 MG/0.6ML ~~LOC~~ PSKT
6.0000 mg | PREFILLED_SYRINGE | Freq: Once | SUBCUTANEOUS | Status: AC
  Administered 2019-09-22: 6 mg via SUBCUTANEOUS

## 2019-09-22 MED ORDER — PROCHLORPERAZINE MALEATE 10 MG PO TABS
10.0000 mg | ORAL_TABLET | Freq: Once | ORAL | Status: AC
  Administered 2019-09-22: 10 mg via ORAL

## 2019-09-22 NOTE — Progress Notes (Signed)
Per Dr. Burr Medico, ok to treat with ANC 1.2 and total bili 1.9.

## 2019-09-22 NOTE — Patient Instructions (Signed)

## 2019-09-22 NOTE — Patient Instructions (Signed)
[facility] Discharge Instructions for Patients Receiving Chemotherapy  Today you received the following chemotherapy agents: gemcitabine.  To help prevent nausea and vomiting after your treatment, we encourage you to take your nausea medication as directed.   If you develop nausea and vomiting that is not controlled by your nausea medication, call the clinic.   BELOW ARE SYMPTOMS THAT SHOULD BE REPORTED IMMEDIATELY:  *FEVER GREATER THAN 100.5 F  *CHILLS WITH OR WITHOUT FEVER  NAUSEA AND VOMITING THAT IS NOT CONTROLLED WITH YOUR NAUSEA MEDICATION  *UNUSUAL SHORTNESS OF BREATH  *UNUSUAL BRUISING OR BLEEDING  TENDERNESS IN MOUTH AND THROAT WITH OR WITHOUT PRESENCE OF ULCERS  *URINARY PROBLEMS  *BOWEL PROBLEMS  UNUSUAL RASH Items with * indicate a potential emergency and should be followed up as soon as possible.  Feel free to call the clinic should you have any questions or concerns. The clinic phone number is [phone].  Please show the CHEMO ALERT CARD at check-in to the Emergency Department and triage nurse.   

## 2019-10-06 ENCOUNTER — Inpatient Hospital Stay: Payer: Medicaid Other

## 2019-10-06 ENCOUNTER — Inpatient Hospital Stay: Payer: Medicaid Other | Admitting: Hematology

## 2019-10-06 ENCOUNTER — Telehealth: Payer: Self-pay

## 2019-10-06 NOTE — Telephone Encounter (Signed)
I spoke with [name] she left a message to cancel her appts for today.  She states she is just very tired.  Message sent to scheduling to adjust schedule.

## 2019-10-11 NOTE — Progress Notes (Signed)
 [facility]   [phone] [phone]   Clinic Follow up Note   Patient Care Team: [name], Carmen Christina, [name] as PCP - General (Physician Assistant) Yates, Mark C, MD as Consulting Physician (Orthopedic Surgery)  Date of Service:  10/13/2019  CHIEF COMPLAINT:  F/u ofmetastatic breast cancer  SUMMARY OF ONCOLOGIC HISTORY: Oncology History Overview Note   Cancer Staging No matching staging information was found for the patient.     [name], right (HCC)  11/1997 Cancer Diagnosis   Patient had 1.4 cm poorly differentiated right breast carcinoma diagnosed in Nov 1999 at age 32, 1/7 axillary nodes involved, ER/PR and HER 2 positive. She had mastectomy with the 7 axillary node evaluation, 4 cycles of adriamycin/ cytoxan followed by taxotere, then five years of tamoxifen thru June 2005 (no herceptin in 1999). She had bilateral oophorectomy in May 2005. She was briefly on aromatase inhibitor after tamoxifen, but discontinued this herself due to poor tolerance.   04/04/2015 Genetic Testing   Negative genetic testing on the breast/ovarian cancer pnael.  The Breast/Ovarian gene panel offered by [facility] includes sequencing and rearrangement analysis for the following 20 genes:  ATM, BARD1, BRCA1, BRCA2, BRIP1, CDH1, CHEK2, EPCAM, FANCC, MLH1, MSH2, MSH6, NBN, PALB2, PMS2, PTEN, RAD51C, RAD51D, TP53, and XRCC2.   06/26/2015 Pathology Results   Initial Path Report Bone, curettage, Left femur met, intramedullary subtroch tissue - METASTATIC ADENOCARCINOMA.  Estrogen Receptor: 95%, POSITIVE, STRONG STAINING INTENSITY Progesterone Receptor: 2%, POSITIVE, STRONG  HER2 (+), IHC 3+   06/26/2015 Surgery   Biopsy of metastatic tissue and stabilization with Affixus trochanteric nail, proximal and distal interlock of left femur by Dr. Yates    06/28/2015 Imaging   CT Chest w/ Contrast 1. Extensive right internal mammary chain  lymphadenopathy consistent with metastatic breast cancer. 2. Lytic metastases involving the posterior elements at T1 with probable nondisplaced pathologic fracture of the spinous process. 3. No other evidence of thoracic metastatic disease. 4. Trace bilateral pleural effusions with associated bibasilar atelectasis. 5. Indeterminate right thyroid nodule, not previously imaged. This could be evaluated with thyroid ultrasound as clinically warranted.   06/29/2015 -  Chemotherapy   Zometa started monthly on 06/29/15 and switched to q3months from 01/08/17   07/01/2015 Imaging   Bone Scan 1. Increased activity noted throughout the left femur. Although metastatic disease cannot be excluded, these changes are most likely secondary to prior surgery. 2. No other focal abnormalities identified to suggest metastatic disease.   07/12/2015 - 07/26/2015 Radiation Therapy   Left femur, 30 Gy in 10 fractions by Dr. Kinard    07/14/2015 Initial Diagnosis   [name], right (HCC)   07/19/2015 Imaging   Initial PET Scan 1. Severe multifocal osseous metastatic disease, much greater than anticipated based on prior imaging. 2. Multifocal nodal metastases involving the right internal mammary, prevascular and subpectoral lymph nodes. No axillary pulmonary involvement identified. 3. No distant extra osseous metastases.   07/30/2015 - 11/29/2015 Chemotherapy   Docetaxel, Herceptin and Perjeta every 3 weeks X6 cycles, pt tolerated moderately well, restaging scan showed excellent response    09/18/2015 Imaging   CT Head w/wo Contrast 1. No acute intracranial abnormality or significant interval change. 2. Stable minimal periventricular white matter hypoattenuation on the right. This may reflect a remote ischemic injury. 3. No evidence for metastatic disease to the brain. 4. No focal soft tissue lesion to explain the patient's tenderness.   10/30/2015 Imaging     CT Abdomen Pelvis w/  Contrast 1. Few mildly prominent fluid-filled loops of small bowel scattered throughout the abdomen, with intraluminal fluid density within the distal colon. Given the provided history, findings are suggestive of acute enteritis/diarrheal illness. 2. No other acute intra-abdominal or pelvic process identified. 3. Widespread osseous metastatic disease, better evaluated on most recent PET-CT from 07/19/2015. No pathologic fracture or other complication. 4. 11 mm hypodensity within the left kidney. This lesion measures intermediate density, and is indeterminate. While this lesion is similar in size relative to recent studies, this is increased in size relative to prior study from 2012. Further evaluation with dedicated renal mass protocol CT and/or MRI is recommended for complete characterization.   12/2015 - 05/14/2016 Chemotherapy   Maintenance Herceptin and pejeta every 3 weeks stopped on 05/14/16 due to disease progression   12/25/2015 Imaging   Restaging PET Scan 1. Markedly improved skeletal activity, with only several faint foci of residual accentuated metabolic activity, but resolution of the vast majority of the previously extensive osseous metastatic disease. 2. Resolution of the prior right internal mammary and right prevascular lymph nodes. 3. Residual subcutaneous edema and edema along fascia planes in the left upper thigh. This remains somewhat more than I would expect for placement of an IM nail 6 weeks ago. If there is leg swelling further down, consider Doppler venous ultrasound to rule out left lower extremity DVT. I do not perceive an obvious difference in density between the pelvic and common femoral veins on the noncontrast CT data. 4. Chronic right maxillary and right sphenoid sinusitis.   01/16/2016 - 10/20/2017 Anti-estrogen oral therapy   Letrozole 2.5 mg daily. She was switched to exemestane due to disease progression on 02/08/17. Stopped on 10/20/2017 when treatment changed to chemo     03/01/2016 Miscellaneous   Patient presented to ED following a fall; she reports she landed on her back and is now experiencing back pain. The patient was evaluated and discharge home same day with pain control.   05/01/2016 Imaging   CT CAP IMPRESSION: 1. Stable exam.  No new or progressive disease identified. 2. No evidence for residual or recurrent adenopathy within the chest. 3. Stable bone metastasis.   05/01/2016 Imaging   BONE SCAN IMPRESSION: 1. Small focal uptake involving the anterior rib ends of the left second and right fourth ribs, new since the prior bone scan. The location of this uptake is more suggestive of a traumatic or inflammatory etiology as opposed to metastatic disease. No other evidence of metastatic disease. 2. There are other areas of stable uptake which are most likely degenerative/reactive, including right shoulder and cervical spine uptake and uptake along the right femur adjacent to the ORIF hardware.   05/20/2016 Imaging   NM PET Skull to Thigh IMPRESSION: 1. There multiple metastatic foci in the bony pelvis, including some new abnormal foci compare to the prior PET-CT, compatible with mildly progressive bony metastatic disease. Also, a left T11 vertebral hypermetabolic metastatic lesion is observed, new compared to the prior exam. 2. No active extraosseous malignancy is currently identified. 3. Right anterior fourth rib healing fracture, appears be   06/04/2016 - 01/08/2017 Chemotherapy   Second line chemotherapy Kadcyla every 3 weeks started on 06/04/16 and stopped on 01/08/17 due to mixed response.     10/05/2016 Imaging   CT CAP and Bone Scan:  Bones: left intramedullary rod and left femoral head neck screw in place. In the proximal diaphysis of the left femur there is cortical thickening and   irregularity. No lytic or blastic bone lesions. No fracture or vertebral endplate destruction.   No definite evidence of recurrent disease in the  chest, abdomen, or pelvis.   10/28/2016 Imaging   CT A/P IMPRESSION: No acute process demonstrated in the abdomen or pelvis. No evidence of bowel obstruction or inflammation.   11/04/2016 Imaging   DG foot complete left: IMPRESSION: No fracture or dislocation.  No soft tissue abnormality   11/23/2016 Imaging   CT RIGHT FEMUR IMPRESSION: Negative CT scan of the right thigh.  No visible metastatic disease.  CT LEFT FEMUR IMPRESSION: 1. Postsurgical changes related to prior cephalomedullary rod fixation of the left femur with linear lucency along the anterolateral proximal femoral cortex at level of the lesser trochanter, suspicious for nondisplaced fracture. 2. Slightly permeative appearance of the proximal left femoral cortex at the site of known prior osseous metastases is largely unchanged. 3. Unchanged patchy lucency and sclerosis along the anterior acetabulum, which appears to correspond with an area of faint uptake on the prior PET-CT, suspicious for osseous metastasis. These results will be called to the ordering clinician or representative by the Radiologist Assistant, and communication documented in the PACS or zVision Dashboard.     01/27/2017 Imaging   IMPRESSION: 1. Mixed response to osseous metastasis. Some hypermetabolic lesions are new and increasingly hypermetabolic, while another index lesion is decreasingly hypermetabolic. 2. No evidence of hypermetabolic soft tissue metastasis.    02/08/2017 - 10/19/2017 Antibody Plan   -Herceptin every 3 week since 02/08/17 -Oral Lanpantinib 1562m and decreased to 5045mdue to diarrhea and depression. Due to disease progression she swithced to Verzenio 15035mn 05/04/17. She did not toelrate well so she was switched to Ibrance 100m50m 05/31/17. Due to her neutropenia, Ibrance dose reduced to 75 mg daily, 3 weeks on, one-week off, stopped on 10/20/2017 due to disease progression.    04/30/2017 PET scan   IMPRESSION: 1.  Primarily increase in hypermetabolism of osseous metastasis. An isolated right acetabular lesion is decreased in hypermetabolism since the prior exam. 2. No evidence of hypermetabolic soft tissue metastasis. 3. Similar non FDG avid right-sided thyroid nodule, favoring a benign etiology. Recommend attention on follow-up.   08/30/2017 PET scan   08/30/2017 PET Scan  IMPRESSION: 1. Worsening osseous metastatic disease. 2. Focal hypermetabolism in the central spinal canal the L1 level, similar to the prior exam. Metastatic implant cannot be excluded. 3. Slight enlargement of a right thyroid nodule which now appears more cystic in character. Consider further evaluation with thyroid ultrasound. If patient is clinically hyperthyroid, consider nuclear medicine thyroid uptake and scan   08/30/2017 Progression   08/30/2017 PET Scan  IMPRESSION: 1. Worsening osseous metastatic disease. 2. Focal hypermetabolism in the central spinal canal the L1 level, similar to the prior exam. Metastatic implant cannot be excluded. 3. Slight enlargement of a right thyroid nodule which now appears more cystic in character. Consider further evaluation with thyroid ultrasound. If patient is clinically hyperthyroid, consider nuclear medicine thyroid uptake and scan   09/29/2017 - 10/11/2017 Radiation Therapy   Pt received 27 Gy in 9 fractions directed at the areas of significant uptake noted on recent bone scan the right pelvis and right proximal femur, with Dr, KinaSondra Come10/09/2017 - 04/07/2018 Chemotherapy   -Herceptin every 3 weeks starting 02/08/17, perjeta added on 10/20/2017  -weekly Taxol started on 10/21/2017. Will reduce Taxol to 2 weeks on, 1 week off starting on 02/04/17.  Stoped due to mild disease progression  10/27/2017 Imaging   10/27/2017 CT AP IMPRESSION: 1. No acute abnormality. No evidence of bowel obstruction or acute bowel inflammation. Normal appendix. 2. Stable patchy sclerotic osseous  metastases throughout the visualized skeleton. No new or progressive metastatic disease in the abdomen or pelvis.   01/11/2018 Imaging   Whole Body Bone Scan 01/11/18  IMPRESSION: 1. Multifocal osseous metastases as described. 2. Right scapular metastasis. 3. Right superior thoracic spinous process. 4. Right sacral ala and right L5 metastases. 5. Right acetabular metastasis. 6. Right proximal femur metastasis. 7. Diffuse metastases throughout the left femur. 8. Anterior right fourth rib metastasis.   01/11/2018 Imaging   CT CAP W Contrast 01/11/18  IMPRESSION: 1. Similar-appearing osseous metastatic disease. 2. No evidence for progressed metastatic disease in the chest, abdomen or pelvis.   03/30/2018 Imaging   Bone Scan 03/30/18  IMPRESSION: Stable multifocal bony metastatic disease.   03/30/2018 Imaging    CT CAP W Contrast 03/30/18 IMPRESSION: 1. New mild contiguous wall thickening with associated mucosal hyperenhancement throughout the left colon and rectum, suggesting nonspecific infectious or inflammatory proctocolitis. Differential includes C diff colitis. 2. Stable faint patchy sclerosis in the iliac bones and lumbar vertebral bodies. No new or progressive osseous metastatic disease by CT. Please note that the osseous lesions are relatively occult by CT and would be better evaluated by PET-CT, as clinically warranted. 3. No evidence of extra osseous metastatic disease.  These results will be called to the ordering clinician or representative by the Radiologist Assistant, and communication documented in the PACS or zVision Dashboard.   04/15/2018 - 06/16/2018 Chemotherapy   Enhertu q3weeks starting 04/15/18. Stopped Enhertu after cycle 3 on 05/27/18 due to poor toleration due to poor toleration with anorexia, weight loss, N&V, diarrhea   04/21/2018 PET scan   PET 04/21/18  IMPRESSION: 1. Overall marked interval improvement in bony hypermetabolic disease. All of the  previously identified hypermetabolic osseous metastases have decreased in hypermetabolism in the interval. There is a new tiny focus of hypermetabolism in the right aspect of the L3 vertebral body that appears to correspond to a new 8 mm lucency, raising concern for new metastatic disease. Close attention on follow-up recommended. 2. Focal hypermetabolism in the central spinal canal at the level of L1 previously has resolved. 3. No hypermetabolic soft tissue disease on today's study. 4. Right thyroid nodule seen previously has decreased in the interval.    06/16/2018 Imaging   CT LUMBAR SPINE WO CONTRAST 06/16/18 IMPRESSION: 1. Transitional lumbosacral anatomy. Correlation with radiographs is recommended prior to any operative intervention. 2. Suspicion of a right L3 vertebral body metastasis on the April PET-CT is not evident by CT. Stable degenerative versus metastatic lesion at the right L5 superior endplate. Small but increased right iliac bone metastasis since March. Lumbar MRI would best characterize lumbar metastatic disease (without contrast if necessary). 3. Suspect L2-L3 left subarticular segment disc herniation. Query left L2 radiculitis. 4. Stable appearance of rightward L4-L5 disc degeneration which could affect the right foramen and right lateral recess.   06/17/2018 - 11/16/2018 Chemotherapy   Restart Taxol 2 weeks on/1 week off with Herceptin and Perjeta q3weeks on 06/17/18. Perjeta was stopped after C5 due to diarrhea. Stopped Taxol after 11/16/18 due to disease progression to L3.    07/06/2018 PET scan   PET  IMPRESSION: 1. Near total resolution of hypermetabolic activity associated with skeletal metastasis. Very mild activity associated with the LEFT iliac bone which is similar to background blood pool activity. 2. No new   or progressive disease.   11/21/2018 PET scan   IMPRESSION: 1. New focal hypermetabolic lesion eccentric to the right in the L3 vertebral body,  maximum SUV 6.1 compatible with a new focus of active malignancy. 2. Very low-grade activity similar to blood pool in previous existing mildly sclerotic metastatic lesions including the left iliac bone lesion. Some of the mildly sclerotic lesions are not hypermetabolic compatible with effectively treated bony metastatic disease. 3. No hypermetabolic soft tissue lesions are identified.     11/30/2018 - 08/25/2019 Chemotherapy   Continue Herceptin q3weeks. Stopped 08/25/19 due to significant disease progression and switched to  IV Matuzumab   12/12/2018 - 12/17/2018 Chemotherapy   Tucatinib (TUKYSA) 6 tabs BID as a next line therapy starting 12/12/18. Stopped 12/17/18 duet o N&V, poor appetite and HA    12/21/2018 - 03/17/2019 Chemotherapy   IV Eribulin on day 1, 8 every 21 days starting 12/21/18. Stopped 03/17/19 due to mixed response to treatment.    03/21/2019 PET scan   IMPRESSION: 1. Mixed response to therapy of osseous metastasis. Although a right-sided L4 lesion demonstrates decreased hypermetabolism today, new left iliac and right ischial hypermetabolic lesions are seen. 2. No evidence of hypermetabolic soft tissue metastasis.     03/27/2019 - 07/21/2019 Chemotherapy   Neratinib (Nerlynx) titrate up from 156m to 2419mstarting 03/27/19. She could not tolerate 16056mnd stopped on 02/05/19 due to N&V and diarrhea. Will restart at 120m25m 04/10/18-04/27/19 and 4/19-4/22. Reduced to 80mg17me daily on 05/10/19. Held/stopped since 07/21/19 due to dry cough, Nausea and body aches.   06/23/2019 Imaging   CT Head  IMPRESSION: 1. No acute intracranial abnormality or metastatic disease identified. 2. Chronic white matter disease in the right hemisphere is stable from prior exams.     09/05/2019 PET scan   IMPRESSION: 1. Significant progression of widespread hypermetabolic osseous metastases throughout the axial skeleton as detailed. 2. No hypermetabolic extraosseous sites of metastatic disease.    09/15/2019 -  Chemotherapy   IV Matuzumab q3weeks and IV Gemcitabine 2 weeks on/1 week off starting 09/15/19      CURRENT THERAPY:  -Zometa q3mont72monthMargetuximab q3weeks and IV Gemcitabine2 weeks on/1 week offstarting 09/15/19  INTERVAL HISTORY:  Teyah ANUPAMA PIEHLre for a follow up and treatment. She presents to the clinic with her interpretor. She notes she was very fatigued last week and had nausea and headache, no vomiting. This resolved after 2 days. She feels she is back to baseline now. She has stable pain in her right shoulder and in her legs. She uses oxycodone 5mg 1-40mimes a day. She is taking potassium daily now.     REVIEW OF SYSTEMS:   Constitutional: Denies fevers, chills or abnormal weight loss Eyes: Denies blurriness of vision Ears, nose, mouth, throat, and face: Denies mucositis or sore throat Respiratory: Denies cough, dyspnea or wheezes Cardiovascular: Denies palpitation, chest discomfort or lower extremity swelling Gastrointestinal:  Denies nausea, heartburn or change in bowel habits Skin: Denies abnormal skin rashes Lymphatics: Denies new lymphadenopathy or easy bruising Neurological:Denies numbness, tingling or new weaknesses Behavioral/Psych: Mood is stable, no new changes  All other systems were reviewed with the patient and are negative.  MEDICAL HISTORY:  Past Medical History:  Diagnosis Date  . Abnormal Pap smear   . Anxiety   . Breast cancer (HCC)   [place] (HCC) [place]  breast-s/p mastectomy, chemo, rad  . CIN I (cervical intraepithelial neoplasia I) 2003   by colpo  .  Congenital deafness   . Deaf   . Depression   . Port-A-Cath in place 2017   for chemotherapy  . Radiation 07/12/15-07/26/15   left femur 30 Gy  . S/P bilateral oophorectomy     SURGICAL HISTORY: Past Surgical History:  Procedure Laterality Date  . ABDOMINAL HYSTERECTOMY    . INTRAMEDULLARY (IM) NAIL INTERTROCHANTERIC Left 06/26/2015   Procedure: LEFT BIOMET LONG  AFFIXS NAIL;  Surgeon: Mark C Yates, MD;  Location: [facility] OR;  Service: Orthopedics;  Laterality: Left;  . IR GENERIC HISTORICAL  08/06/2015   IR US GUIDE VASC ACCESS LEFT 08/06/2015 Glenn Yamagata, MD [facility]  . IR GENERIC HISTORICAL  08/06/2015   IR FLUORO GUIDE CV LINE LEFT 08/06/2015 Glenn Yamagata, MD [facility]  . IR GENERIC HISTORICAL  03/05/2016   IR CV LINE INJECTION 03/05/2016 Adam Henn, MD [facility]  . LEFT HEART CATH AND CORONARY ANGIOGRAPHY N/A 12/13/2017   Procedure: LEFT HEART CATH AND CORONARY ANGIOGRAPHY;  Surgeon: Harding, David W, MD;  Location: [facility] INVASIVE CV [facility];  Service: Cardiovascular;  Laterality: N/A;  . MASTECTOMY     right breast  . MASTECTOMY    . OVARIAN CYST REMOVAL    . RADIOLOGY WITH ANESTHESIA Left 06/25/2015   Procedure: MRI OF LEFT HIP WITH OR WITHOUT CONTRAST;  Surgeon: Medication Radiologist, MD;  Location: [facility] OR;  Service: Radiology;  Laterality: Left;  DR. MCINTYRE/MRI  . TUBAL LIGATION      I have reviewed the social history and family history with the patient and they are unchanged from previous note.  ALLERGIES:  is allergic to promethazine hcl, chlorhexidine, and diphenhydramine hcl.  MEDICATIONS:  Current Outpatient Medications  Medication Sig Dispense Refill  . ALPRAZolam (XANAX) 0.25 MG tablet Take 1 tablet (0.25 mg total) by mouth 2 (two) times daily as needed for anxiety. 30 tablet 0  . diphenoxylate-atropine (LOMOTIL) 2.5-0.025 MG tablet Take 1-2 tablets by mouth 4 (four) times daily as needed for diarrhea or loose stools. 60 tablet 1  . guaiFENesin-codeine 100-10 MG/5ML syrup Take 5 mLs by mouth every 4 (four) hours as needed for cough. 180 mL 0  . lidocaine-prilocaine (EMLA) cream Apply 1 application topically as needed. Apply to Porta-Cath 1-2 hours prior to access as directed. 90 g 2  . ondansetron (ZOFRAN) 8 MG tablet Take 1 tablet (8 mg total) by mouth every 8 (eight) hours as needed for nausea or vomiting. 45 tablet 1  .  oxyCODONE (ROXICODONE) 5 MG immediate release tablet Take 1 tablet (5 mg total) by mouth every 8 (eight) hours as needed for severe pain. 60 tablet 0  . potassium chloride (KLOR-CON) 10 MEQ tablet Take 3 tablets (30 mEq total) by mouth daily. 90 tablet 2  . prochlorperazine (COMPAZINE) 10 MG tablet Take 1 tablet (10 mg total) by mouth 2 (two) times daily as needed for nausea or vomiting. 10 tablet 0  . zolpidem (AMBIEN) 10 MG tablet Take 1 tablet (10 mg total) by mouth at bedtime as needed for sleep. 30 tablet 0   No current facility-administered medications for this visit.   Facility-Administered Medications Ordered in Other Visits  Medication Dose Route Frequency Provider Last Rate Last Admin  . potassium chloride SA (K-DUR) CR tablet 40 mEq  40 mEq Oral Once , , MD      . sodium chloride 0.9 % 1,000 mL with potassium chloride 10 mEq infusion   Intravenous Continuous Livesay, Lennis P, MD   Stopped at 09/10/15 1653  .   sodium chloride 0.9 % injection 10 mL  10 mL Intravenous PRN Livesay, Lennis P, MD        PHYSICAL EXAMINATION: ECOG PERFORMANCE STATUS: 2 - Symptomatic, <50% confined to bed BP 121/76, HR 71, RRS 16, temperature 36.4, pulse ox 100% on room air, weight 129 pounds  Due to COVID19 we will limit examination to appearance. Patient had no complaints.  GENERAL:alert, no distress and comfortable SKIN: skin color normal, no rashes or significant lesions (+) Mild bruise of lower ankle EYES: normal, Conjunctiva are pink and non-injected, sclera clear  NEURO: alert & oriented x 3 with fluent speech   LABORATORY DATA:  I have reviewed the data as listed CBC Latest Ref Rng & Units 10/13/2019 09/22/2019 09/15/2019  WBC 4.0 - 10.5 K/uL 3.6(L) 2.4(L) 4.0  Hemoglobin 12.0 - 15.0 g/dL 10.1(L) 10.7(L) 11.0(L)  Hematocrit 36 - 46 % 30.2(L) 31.2(L) 32.2(L)  Platelets 150 - 400 K/uL 267 117(L) 200     CMP Latest Ref Rng & Units 10/13/2019 09/22/2019 09/15/2019  Glucose 70 - 99 mg/dL 87 89  95  BUN 6 - 20 mg/dL 10 10 12  Creatinine 0.44 - 1.00 mg/dL 0.81 0.87 0.92  Sodium 135 - 145 mmol/L 142 141 141  Potassium 3.5 - 5.1 mmol/L 3.4(L) 3.4(L) 3.1(L)  Chloride 98 - 111 mmol/L 110 106 107  CO2 22 - 32 mmol/L 24 25 22  Calcium 8.9 - 10.3 mg/dL 9.0 9.2 9.4  Total Protein 6.5 - 8.1 g/dL 6.3(L) 6.7 6.4(L)  Total Bilirubin 0.3 - 1.2 mg/dL 1.6(H) 1.9(H) 1.5(H)  Alkaline Phos 38 - 126 U/L 78 59 62  AST 15 - 41 U/L 17 15 12(L)  ALT 0 - 44 U/L 17 18 6      RADIOGRAPHIC STUDIES: I have personally reviewed the radiological images as listed and agreed with the findings in the report. No results found.   ASSESSMENT & PLAN:  Bisma C Ammons is a 54 y.o. 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 She had right mastectomy, 4 cycles of AC-T and 5 years of Tamoxifen/AI.  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 -After her first line of chemo treatment and initial maintenance therapy, she progressed or had poor toleration of multiple lines of treatment since then. Latest in 08/2019. -Ipreviouslydiscussed after multiple treatments tried, her remaining options are fewer. I recommendednext-line (7th line)with anti-HER2IV Margetuximabq3weekswithIV Chemo weeklyGemcitabine2 weeks on/1 week off. She started on 09/15/19. -S/p C1 she has tolerated well with 2 days of significant fatigue and nausea and headaches which postponed C2 for a week. She has recovered well.  -Labs reviewed, CBC and CMP WNL except WBC 3.6, Hg 10.1, K 3.4, protein 6.3. Overall adequate to proceed with C2D1 Margetuximab and Gemcitabine today. Proceed with Gemcitabine next week  -Plan to scan her after 4 cycles of treatment.  -F/u in 3 weeks  -She declined flu and COVID19 vaccines   2.Body pain, secondary to bone mets, Hypocalcemia, leg cramps  -She is being treated  with Zometa q3months.  -She will continue oral calcium and Vit Dand Prilosec for Acid Reflux. -Current pain includes, lower back, upper legs, hips and right shoulder.PET from8/24/21does indicate disease progression in her spine, pelvis and right scapula. -Pain is currently controlled on 5mg Oxycodone1-2 tabs daily.  -Istarted her onnext line treatment(09/15/19)to control disease and help pain.  -Stable     3.Congenital deafness  4.Mild anemia, secondary to chemotherapy and malignancy -Stable   5.Hypokalemia, Hypomagnesium, secondary toDiarrhea -She developed diarrhea s/p cycle 3 Perjetaand again with Nerlynx, Both have been D/c. -She is onOral potassium 85mq3 times a day.She stopped oral Mag because it made her nauseous.  -She can take more potassium when she has diarrhea.   6.Goal of care discussion, DNR/DNI -We previously reviewed goal of care andI recommend DNR/DNI, sheagreed.  -I encouraged her to start setting up her living will. We previously discussed hospice care if she has no more treatment available or could not tolerate more treatment  7.Insomnia -She cannot take Benadryl due to allergy reaction. She has tried trazodone with no relief.  -She is on Xanax and Ambien 588mtabs nightly. I prescribe at 1010mmbien (take half tablet) given insurance. She knows not to take both at the same time.   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 Stable  9. Mildhyperbilirubinemia  -likely second to chemotherapy, fluctuates  -Will monitor closely. Stable.    PLAN: -Proceed with Zometa today and continue every 3 months  -Labs reviewed and adequate to proceed with C2D1 Margetuximab and Gemcitabine today. -Lab, flush, Gemcitabine in 1 and 4 weeks with Onpro.  -Lab, flush, F/u and Margetuximab and Gemcitabine in 3 weeks   No problem-specific Assessment & Plan notes found for this encounter.   No orders of the defined types were placed in this  encounter.  All questions were answered. The patient knows to call the clinic with any problems, questions or concerns. No barriers to learning was detected. The total time spent in the appointment was 30 minutes.     YanTruitt MerleD 10/13/2019   I, AmoJoslyn Devonm acting as scribe for YanTruitt MerleD.   I have reviewed the above documentation for accuracy and completeness, and I agree with the above.

## 2019-10-13 ENCOUNTER — Inpatient Hospital Stay: Payer: Medicaid Other

## 2019-10-13 ENCOUNTER — Encounter: Payer: Self-pay | Admitting: Hematology

## 2019-10-13 ENCOUNTER — Inpatient Hospital Stay (HOSPITAL_BASED_OUTPATIENT_CLINIC_OR_DEPARTMENT_OTHER): Payer: Medicaid Other | Admitting: Hematology

## 2019-10-13 ENCOUNTER — Other Ambulatory Visit: Payer: Self-pay

## 2019-10-13 ENCOUNTER — Inpatient Hospital Stay: Payer: Medicaid Other | Attending: Hematology

## 2019-10-13 VITALS — BP 120/49 | HR 71 | Temp 97.4°F | Resp 16

## 2019-10-13 DIAGNOSIS — Z23 Encounter for immunization: Secondary | ICD-10-CM | POA: Insufficient documentation

## 2019-10-13 DIAGNOSIS — C7951 Secondary malignant neoplasm of bone: Secondary | ICD-10-CM

## 2019-10-13 DIAGNOSIS — E876 Hypokalemia: Secondary | ICD-10-CM | POA: Insufficient documentation

## 2019-10-13 DIAGNOSIS — Z5111 Encounter for antineoplastic chemotherapy: Secondary | ICD-10-CM | POA: Insufficient documentation

## 2019-10-13 DIAGNOSIS — G4700 Insomnia, unspecified: Secondary | ICD-10-CM | POA: Diagnosis not present

## 2019-10-13 DIAGNOSIS — R079 Chest pain, unspecified: Secondary | ICD-10-CM | POA: Diagnosis not present

## 2019-10-13 DIAGNOSIS — D6481 Anemia due to antineoplastic chemotherapy: Secondary | ICD-10-CM | POA: Diagnosis not present

## 2019-10-13 DIAGNOSIS — A0472 Enterocolitis due to Clostridium difficile, not specified as recurrent: Secondary | ICD-10-CM | POA: Insufficient documentation

## 2019-10-13 DIAGNOSIS — H905 Unspecified sensorineural hearing loss: Secondary | ICD-10-CM | POA: Insufficient documentation

## 2019-10-13 DIAGNOSIS — Z5189 Encounter for other specified aftercare: Secondary | ICD-10-CM | POA: Diagnosis not present

## 2019-10-13 DIAGNOSIS — C50911 Malignant neoplasm of unspecified site of right female breast: Secondary | ICD-10-CM

## 2019-10-13 DIAGNOSIS — G620 Drug-induced polyneuropathy: Secondary | ICD-10-CM | POA: Insufficient documentation

## 2019-10-13 DIAGNOSIS — Z95828 Presence of other vascular implants and grafts: Secondary | ICD-10-CM

## 2019-10-13 DIAGNOSIS — Z5112 Encounter for antineoplastic immunotherapy: Secondary | ICD-10-CM | POA: Insufficient documentation

## 2019-10-13 DIAGNOSIS — Z170 Estrogen receptor positive status [ER+]: Secondary | ICD-10-CM | POA: Insufficient documentation

## 2019-10-13 LAB — CMP (CANCER CENTER ONLY)
ALT: 17 U/L (ref 0–44)
AST: 17 U/L (ref 15–41)
Albumin: 3.5 g/dL (ref 3.5–5.0)
Alkaline Phosphatase: 78 U/L (ref 38–126)
Anion gap: 8 (ref 5–15)
BUN: 10 mg/dL (ref 6–20)
CO2: 24 mmol/L (ref 22–32)
Calcium: 9 mg/dL (ref 8.9–10.3)
Chloride: 110 mmol/L (ref 98–111)
Creatinine: 0.81 mg/dL (ref 0.44–1.00)
GFR, Est AFR Am: >60 mL/min (ref >60)
GFR, Estimated: >60 mL/min (ref >60)
Glucose, Bld: 87 mg/dL (ref 70–99)
Potassium: 3.4 mmol/L — ABNORMAL LOW (ref 3.5–5.1)
Sodium: 142 mmol/L (ref 135–145)
Total Bilirubin: 1.6 mg/dL — ABNORMAL HIGH (ref 0.3–1.2)
Total Protein: 6.3 g/dL — ABNORMAL LOW (ref 6.5–8.1)

## 2019-10-13 LAB — CBC WITH DIFFERENTIAL (CANCER CENTER ONLY)
Abs Immature Granulocytes: 0.02 10*3/uL (ref 0.00–0.07)
Basophils Absolute: 0 10*3/uL (ref 0.0–0.1)
Basophils Relative: 1 %
Eosinophils Absolute: 0 10*3/uL (ref 0.0–0.5)
Eosinophils Relative: 1 %
HCT: 30.2 % — ABNORMAL LOW (ref 36.0–46.0)
Hemoglobin: 10.1 g/dL — ABNORMAL LOW (ref 12.0–15.0)
Immature Granulocytes: 1 %
Lymphocytes Relative: 22 %
Lymphs Abs: 0.8 10*3/uL (ref 0.7–4.0)
MCH: 30.1 pg (ref 26.0–34.0)
MCHC: 33.4 g/dL (ref 30.0–36.0)
MCV: 90.1 fL (ref 80.0–100.0)
Monocytes Absolute: 0.5 10*3/uL (ref 0.1–1.0)
Monocytes Relative: 13 %
Neutro Abs: 2.3 10*3/uL (ref 1.7–7.7)
Neutrophils Relative %: 62 %
Platelet Count: 267 10*3/uL (ref 150–400)
RBC: 3.35 MIL/uL — ABNORMAL LOW (ref 3.87–5.11)
RDW: 16.5 % — ABNORMAL HIGH (ref 11.5–15.5)
WBC Count: 3.6 10*3/uL — ABNORMAL LOW (ref 4.0–10.5)
nRBC: 0 % (ref 0.0–0.2)

## 2019-10-13 LAB — VITAMIN D 25 HYDROXY (VIT D DEFICIENCY, FRACTURES): Vit D, 25-Hydroxy: 31.65 ng/mL (ref 30–100)

## 2019-10-13 LAB — MAGNESIUM: Magnesium: 1.6 mg/dL — ABNORMAL LOW (ref 1.7–2.4)

## 2019-10-13 MED ORDER — ZOLEDRONIC ACID 4 MG/100ML IV SOLN
4.0000 mg | Freq: Once | INTRAVENOUS | Status: AC
  Administered 2019-10-13: 4 mg via INTRAVENOUS
  Filled 2019-10-13: qty 100

## 2019-10-13 MED ORDER — LORATADINE 10 MG PO TABS
10.0000 mg | ORAL_TABLET | Freq: Once | ORAL | Status: AC
  Administered 2019-10-13: 10 mg via ORAL

## 2019-10-13 MED ORDER — LORATADINE 10 MG PO TABS
ORAL_TABLET | ORAL | Status: AC
  Filled 2019-10-13: qty 1

## 2019-10-13 MED ORDER — INFLUENZA VAC SPLIT QUAD 0.5 ML IM SUSY
0.5000 mL | PREFILLED_SYRINGE | Freq: Once | INTRAMUSCULAR | Status: AC
  Administered 2019-10-13: 0.5 mL via INTRAMUSCULAR

## 2019-10-13 MED ORDER — ZOLEDRONIC ACID 4 MG/100ML IV SOLN: 4.0000 mg | Freq: Once | INTRAVENOUS | Status: DC

## 2019-10-13 MED ORDER — PALONOSETRON HCL INJECTION 0.25 MG/5ML
0.2500 mg | Freq: Once | INTRAVENOUS | Status: AC
  Administered 2019-10-13: 0.25 mg via INTRAVENOUS

## 2019-10-13 MED ORDER — SODIUM CHLORIDE 0.9% FLUSH
10.0000 mL | Freq: Once | INTRAVENOUS | Status: AC
  Administered 2019-10-13: 10 mL
  Filled 2019-10-13: qty 10

## 2019-10-13 MED ORDER — SODIUM CHLORIDE 0.9 % IV SOLN
15.0000 mg/kg | Freq: Once | INTRAVENOUS | Status: AC
  Administered 2019-10-13: 875 mg via INTRAVENOUS
  Filled 2019-10-13: qty 35

## 2019-10-13 MED ORDER — SODIUM CHLORIDE 0.9 % IV SOLN
10.0000 mg | Freq: Once | INTRAVENOUS | Status: AC
  Administered 2019-10-13: 10 mg via INTRAVENOUS
  Filled 2019-10-13: qty 1
  Filled 2019-10-13: qty 10

## 2019-10-13 MED ORDER — ACETAMINOPHEN 325 MG PO TABS
650.0000 mg | ORAL_TABLET | Freq: Once | ORAL | Status: AC
  Administered 2019-10-13: 650 mg via ORAL

## 2019-10-13 MED ORDER — DIPHENHYDRAMINE HCL 25 MG PO CAPS
ORAL_CAPSULE | ORAL | Status: AC
  Filled 2019-10-13: qty 2

## 2019-10-13 MED ORDER — ACETAMINOPHEN 325 MG PO TABS
ORAL_TABLET | ORAL | Status: AC
  Filled 2019-10-13: qty 2

## 2019-10-13 MED ORDER — ZOLEDRONIC ACID 4 MG/100ML IV SOLN
INTRAVENOUS | Status: AC
  Filled 2019-10-13: qty 100

## 2019-10-13 MED ORDER — SODIUM CHLORIDE 0.9 % IV SOLN
Freq: Once | INTRAVENOUS | Status: AC
  Filled 2019-10-13: qty 250

## 2019-10-13 MED ORDER — SODIUM CHLORIDE 0.9 % IV SOLN
800.0000 mg/m2 | Freq: Once | INTRAVENOUS | Status: AC
  Administered 2019-10-13: 1330 mg via INTRAVENOUS
  Filled 2019-10-13: qty 34.98

## 2019-10-13 MED ORDER — PALONOSETRON HCL INJECTION 0.25 MG/5ML
INTRAVENOUS | Status: AC
  Filled 2019-10-13: qty 5

## 2019-10-13 MED ORDER — SODIUM CHLORIDE 0.9% FLUSH
10.0000 mL | INTRAVENOUS | Status: DC | PRN
  Administered 2019-10-13: 10 mL
  Filled 2019-10-13: qty 10

## 2019-10-13 MED ORDER — HEPARIN SOD (PORK) LOCK FLUSH 100 UNIT/ML IV SOLN
500.0000 [IU] | Freq: Once | INTRAVENOUS | Status: AC | PRN
  Administered 2019-10-13: 500 [IU]
  Filled 2019-10-13: qty 5

## 2019-10-13 MED ORDER — ZOLPIDEM TARTRATE 10 MG PO TABS: 10.0000 mg | ORAL_TABLET | Freq: Every evening | ORAL | 0 refills | Status: DC | PRN

## 2019-10-13 NOTE — Progress Notes (Signed)
Per Dr. Burr Medico, ok to treat with elevated total bili 1.6.

## 2019-10-13 NOTE — Patient Instructions (Signed)
[facility] Discharge Instructions for Patients Receiving Chemotherapy  Today you received the following chemotherapy agents: gemcitabine and margetuximab.  To help prevent nausea and vomiting after your treatment, we encourage you to take your nausea medication as directed.    If you develop nausea and vomiting that is not controlled by your nausea medication, call the clinic.   BELOW ARE SYMPTOMS THAT SHOULD BE REPORTED IMMEDIATELY:  *FEVER GREATER THAN 100.5 F  *CHILLS WITH OR WITHOUT FEVER  NAUSEA AND VOMITING THAT IS NOT CONTROLLED WITH YOUR NAUSEA MEDICATION  *UNUSUAL SHORTNESS OF BREATH  *UNUSUAL BRUISING OR BLEEDING  TENDERNESS IN MOUTH AND THROAT WITH OR WITHOUT PRESENCE OF ULCERS  *URINARY PROBLEMS  *BOWEL PROBLEMS  UNUSUAL RASH Items with * indicate a potential emergency and should be followed up as soon as possible.  Feel free to call the clinic should you have any questions or concerns. The clinic phone number is [phone].  Please show the [facility] at check-in to the Emergency Department and triage nurse.

## 2019-10-13 NOTE — Patient Instructions (Signed)

## 2019-10-14 LAB — CANCER ANTIGEN 27.29: CA 27.29: 60.4 U/mL — ABNORMAL HIGH (ref 0.0–38.6)

## 2019-10-16 ENCOUNTER — Telehealth: Payer: Self-pay | Admitting: Hematology

## 2019-10-16 NOTE — Telephone Encounter (Signed)
Scheduled per 10/1 los. Pt is aware of appt times and date.

## 2019-10-20 ENCOUNTER — Other Ambulatory Visit: Payer: Self-pay

## 2019-10-20 ENCOUNTER — Inpatient Hospital Stay: Payer: Medicaid Other

## 2019-10-20 ENCOUNTER — Other Ambulatory Visit: Payer: Self-pay | Admitting: Hematology

## 2019-10-20 VITALS — BP 113/66 | HR 66 | Temp 98.4°F | Resp 18

## 2019-10-20 DIAGNOSIS — C50911 Malignant neoplasm of unspecified site of right female breast: Secondary | ICD-10-CM

## 2019-10-20 DIAGNOSIS — Z95828 Presence of other vascular implants and grafts: Secondary | ICD-10-CM

## 2019-10-20 DIAGNOSIS — C7951 Secondary malignant neoplasm of bone: Secondary | ICD-10-CM

## 2019-10-20 DIAGNOSIS — Z5112 Encounter for antineoplastic immunotherapy: Secondary | ICD-10-CM | POA: Diagnosis not present

## 2019-10-20 LAB — CMP (CANCER CENTER ONLY)
ALT: 23 U/L (ref 0–44)
AST: 15 U/L (ref 15–41)
Albumin: 3.5 g/dL (ref 3.5–5.0)
Alkaline Phosphatase: 69 U/L (ref 38–126)
Anion gap: 7 (ref 5–15)
BUN: 12 mg/dL (ref 6–20)
CO2: 26 mmol/L (ref 22–32)
Calcium: 8.8 mg/dL — ABNORMAL LOW (ref 8.9–10.3)
Chloride: 106 mmol/L (ref 98–111)
Creatinine: 0.85 mg/dL (ref 0.44–1.00)
GFR, Estimated: >60 mL/min (ref >60)
Glucose, Bld: 72 mg/dL (ref 70–99)
Potassium: 3.4 mmol/L — ABNORMAL LOW (ref 3.5–5.1)
Sodium: 139 mmol/L (ref 135–145)
Total Bilirubin: 1.8 mg/dL — ABNORMAL HIGH (ref 0.3–1.2)
Total Protein: 6.4 g/dL — ABNORMAL LOW (ref 6.5–8.1)

## 2019-10-20 LAB — CBC WITH DIFFERENTIAL (CANCER CENTER ONLY)
Abs Immature Granulocytes: 0.02 10*3/uL (ref 0.00–0.07)
Basophils Absolute: 0 10*3/uL (ref 0.0–0.1)
Basophils Relative: 1 %
Eosinophils Absolute: 0 10*3/uL (ref 0.0–0.5)
Eosinophils Relative: 1 %
HCT: 27.8 % — ABNORMAL LOW (ref 36.0–46.0)
Hemoglobin: 9.4 g/dL — ABNORMAL LOW (ref 12.0–15.0)
Immature Granulocytes: 1 %
Lymphocytes Relative: 34 %
Lymphs Abs: 0.9 10*3/uL (ref 0.7–4.0)
MCH: 30.5 pg (ref 26.0–34.0)
MCHC: 33.8 g/dL (ref 30.0–36.0)
MCV: 90.3 fL (ref 80.0–100.0)
Monocytes Absolute: 0.2 10*3/uL (ref 0.1–1.0)
Monocytes Relative: 8 %
Neutro Abs: 1.5 10*3/uL — ABNORMAL LOW (ref 1.7–7.7)
Neutrophils Relative %: 55 %
Platelet Count: 133 10*3/uL — ABNORMAL LOW (ref 150–400)
RBC: 3.08 MIL/uL — ABNORMAL LOW (ref 3.87–5.11)
RDW: 15.2 % (ref 11.5–15.5)
WBC Count: 2.7 10*3/uL — ABNORMAL LOW (ref 4.0–10.5)
nRBC: 0 % (ref 0.0–0.2)

## 2019-10-20 MED ORDER — SODIUM CHLORIDE 0.9% FLUSH
10.0000 mL | INTRAVENOUS | Status: DC | PRN
  Administered 2019-10-20: 10 mL via INTRAVENOUS
  Filled 2019-10-20: qty 10

## 2019-10-20 MED ORDER — HEPARIN SOD (PORK) LOCK FLUSH 100 UNIT/ML IV SOLN
500.0000 [IU] | Freq: Once | INTRAVENOUS | Status: AC | PRN
  Administered 2019-10-20: 500 [IU]
  Filled 2019-10-20: qty 5

## 2019-10-20 MED ORDER — PEGFILGRASTIM 6 MG/0.6ML ~~LOC~~ PSKT
PREFILLED_SYRINGE | SUBCUTANEOUS | Status: AC
  Filled 2019-10-20: qty 0.6

## 2019-10-20 MED ORDER — SODIUM CHLORIDE 0.9 % IV SOLN
Freq: Once | INTRAVENOUS | Status: AC
  Filled 2019-10-20: qty 250

## 2019-10-20 MED ORDER — PROCHLORPERAZINE MALEATE 10 MG PO TABS
10.0000 mg | ORAL_TABLET | Freq: Once | ORAL | Status: AC
  Administered 2019-10-20: 10 mg via ORAL

## 2019-10-20 MED ORDER — SODIUM CHLORIDE 0.9 % IV SOLN
800.0000 mg/m2 | Freq: Once | INTRAVENOUS | Status: AC
  Administered 2019-10-20: 1330 mg via INTRAVENOUS
  Filled 2019-10-20: qty 34.98

## 2019-10-20 MED ORDER — PROCHLORPERAZINE MALEATE 10 MG PO TABS
ORAL_TABLET | ORAL | Status: AC
  Filled 2019-10-20: qty 1

## 2019-10-20 MED ORDER — SODIUM CHLORIDE 0.9% FLUSH
10.0000 mL | INTRAVENOUS | Status: DC | PRN
  Administered 2019-10-20: 10 mL
  Filled 2019-10-20: qty 10

## 2019-10-20 MED ORDER — PEGFILGRASTIM 6 MG/0.6ML ~~LOC~~ PSKT
6.0000 mg | PREFILLED_SYRINGE | Freq: Once | SUBCUTANEOUS | Status: AC
  Administered 2019-10-20: 6 mg via SUBCUTANEOUS

## 2019-10-20 MED ORDER — OXYCODONE HCL 5 MG PO TABS
5.0000 mg | ORAL_TABLET | Freq: Three times a day (TID) | ORAL | 0 refills | Status: DC | PRN
Start: 2019-10-20 — End: 2019-12-01

## 2019-10-20 NOTE — Progress Notes (Signed)
Per Dr. Burr Medico, [place] to treat with elevated bilirubin.

## 2019-10-20 NOTE — Patient Instructions (Signed)
[facility] °Discharge Instructions for Patients Receiving Chemotherapy ° °Today you received the following chemotherapy agents Gemzar ° °To help prevent nausea and vomiting after your treatment, we encourage you to take your nausea medication as directed. °  °If you develop nausea and vomiting that is not controlled by your nausea medication, call the clinic.  ° °BELOW ARE SYMPTOMS THAT SHOULD BE REPORTED IMMEDIATELY: °· *FEVER GREATER THAN 100.5 F °· *CHILLS WITH OR WITHOUT FEVER °· NAUSEA AND VOMITING THAT IS NOT CONTROLLED WITH YOUR NAUSEA MEDICATION °· *UNUSUAL SHORTNESS OF BREATH °· *UNUSUAL BRUISING OR BLEEDING °· TENDERNESS IN MOUTH AND THROAT WITH OR WITHOUT PRESENCE OF ULCERS °· *URINARY PROBLEMS °· *BOWEL PROBLEMS °· UNUSUAL RASH °Items with * indicate a potential emergency and should be followed up as soon as possible. ° °Feel free to call the clinic should you have any questions or concerns. The clinic phone number is [phone]. ° °Please show the CHEMO ALERT CARD at check-in to the Emergency Department and triage nurse. ° ° °

## 2019-10-20 NOTE — Patient Instructions (Signed)

## 2019-10-23 ENCOUNTER — Encounter (HOSPITAL_COMMUNITY): Payer: Self-pay | Admitting: Emergency Medicine

## 2019-10-23 ENCOUNTER — Encounter: Payer: Medicaid Other | Admitting: Nurse Practitioner

## 2019-10-23 ENCOUNTER — Other Ambulatory Visit: Payer: Self-pay

## 2019-10-23 ENCOUNTER — Telehealth: Payer: Self-pay

## 2019-10-23 ENCOUNTER — Emergency Department (HOSPITAL_COMMUNITY)
Admission: EM | Admit: 2019-10-23 | Discharge: 2019-10-23 | Disposition: A | Discharge status: Home / Self Care | Payer: Medicaid Other | Attending: Emergency Medicine | Admitting: Emergency Medicine

## 2019-10-23 DIAGNOSIS — R000 Tachycardia, unspecified: Secondary | ICD-10-CM | POA: Diagnosis not present

## 2019-10-23 DIAGNOSIS — R079 Chest pain, unspecified: Secondary | ICD-10-CM | POA: Diagnosis present

## 2019-10-23 DIAGNOSIS — R110 Nausea: Secondary | ICD-10-CM | POA: Insufficient documentation

## 2019-10-23 DIAGNOSIS — Z5321 Procedure and treatment not carried out due to patient leaving prior to being seen by health care provider: Secondary | ICD-10-CM | POA: Diagnosis not present

## 2019-10-23 DIAGNOSIS — R109 Unspecified abdominal pain: Secondary | ICD-10-CM | POA: Insufficient documentation

## 2019-10-23 NOTE — Telephone Encounter (Signed)
Ms Brereton called stating she has been vomiting since chemotherapy on Friday 10/8.  She is not able to eat or drink much.  She states she has chills at night but has not checked her temperature.  She states her heart is racing and when she takes a deep breath her chest feels tight.  She denies cough and dyspnea.  I reviewed this with Burns Spain, NP.  Judson Roch states she needs to go to the ED as she may have covid and we would not be able to treat her respiratory symptoms in [facility].  I called Ms Greeson and relayed Sarah's recommendation.  She yelled via the sign and interpretor that she was not going to the ED.  They would make her wait 8 hours.  I suggest urgent care and she said no.  I suggested [facility].  She refused this also.  She stated "If I die it is all your faults."

## 2019-10-23 NOTE — ED Triage Notes (Signed)
Pt is deaf and refuses to use the sign language via stratus online machine she writes that she will wait until a person comes which has been called pt wrote me a note explaining "I am very sick with cancer and have been having chest pain and heart racing since my last treatment on Friday, also nausea and abdominal pain I was told to come here but I will leave in 3 hours if I am still waiting".  When asked other questions via notes pt only points back to the above information.

## 2019-10-23 NOTE — ED Notes (Signed)
Pt wrote a note said she will leaving. Pt said the wait is to long

## 2019-10-23 NOTE — ED Notes (Signed)
[employer] Loss adjuster, chartered) called @ [phone]. Her # is [phone].

## 2019-10-24 ENCOUNTER — Telehealth: Payer: Self-pay

## 2019-10-24 NOTE — Telephone Encounter (Signed)
No transition of care call made, pt left before being seen

## 2019-10-24 NOTE — Progress Notes (Signed)
[name]   [phone] [phone]   Clinic Follow up Note   Patient Care Team: [name], [name], [name] as PCP - General (Physician Assistant) [name], MD as Consulting Physician (Orthopedic Surgery)  Date of Service:  10/25/2019  CHIEF COMPLAINT: Nausea, diarrhea, chest pain   SUMMARY OF ONCOLOGIC HISTORY: Oncology History Overview Note   Cancer Staging No matching staging information was found for the patient.     [name], right ([place])  11/1997 Cancer Diagnosis   Patient had 1.4 cm poorly differentiated right breast carcinoma diagnosed in Nov 1999 at age 43, 1/7 axillary nodes involved, ER/PR and HER 2 positive. She had mastectomy with the 7 axillary node evaluation, 4 cycles of adriamycin/ cytoxan followed by taxotere, then five years of tamoxifen thru June 2005 (no herceptin in 1999). She had bilateral oophorectomy in May 2005. She was briefly on aromatase inhibitor after tamoxifen, but discontinued this herself due to poor tolerance.   04/04/2015 Genetic Testing   Negative genetic testing on the breast/ovarian cancer pnael.  The Breast/Ovarian gene panel offered by [facility] includes sequencing and rearrangement analysis for the following 20 genes:  ATM, BARD1, BRCA1, BRCA2, BRIP1, CDH1, CHEK2, EPCAM, FANCC, MLH1, MSH2, MSH6, NBN, PALB2, PMS2, PTEN, RAD51C, RAD51D, TP53, and XRCC2.   06/26/2015 Pathology Results   Initial Path Report Bone, curettage, Left femur met, intramedullary subtroch tissue - METASTATIC ADENOCARCINOMA.  Estrogen Receptor: 95%, POSITIVE, STRONG STAINING INTENSITY Progesterone Receptor: 2%, POSITIVE, STRONG  HER2 (+), IHC 3+   06/26/2015 Surgery   Biopsy of metastatic tissue and stabilization with Affixus trochanteric nail, proximal and distal interlock of left femur by Dr. [name]    06/28/2015 Imaging   CT Chest w/ Contrast 1. Extensive right internal mammary chain  lymphadenopathy consistent with metastatic breast cancer. 2. Lytic metastases involving the posterior elements at T1 with probable nondisplaced pathologic fracture of the spinous process. 3. No other evidence of thoracic metastatic disease. 4. Trace bilateral pleural effusions with associated bibasilar atelectasis. 5. Indeterminate right thyroid nodule, not previously imaged. This could be evaluated with thyroid ultrasound as clinically warranted.   06/29/2015 -  Chemotherapy   Zometa started monthly on 06/29/15 and switched to q65month from 01/08/17   07/01/2015 Imaging   Bone Scan 1. Increased activity noted throughout the left femur. Although metastatic disease cannot be excluded, these changes are most likely secondary to prior surgery. 2. No other focal abnormalities identified to suggest metastatic disease.   07/12/2015 - 07/26/2015 Radiation Therapy   Left femur, 30 Gy in 10 fractions by Dr. [name]   07/14/2015 Initial Diagnosis   [name], right ([place]   07/19/2015 Imaging   Initial PET Scan 1. Severe multifocal osseous metastatic disease, much greater than anticipated based on prior imaging. 2. Multifocal nodal metastases involving the right internal mammary, prevascular and subpectoral lymph nodes. No axillary pulmonary involvement identified. 3. No distant extra osseous metastases.   07/30/2015 - 11/29/2015 Chemotherapy   Docetaxel, Herceptin and Perjeta every 3 weeks X6 cycles, pt tolerated moderately well, restaging scan showed excellent response    09/18/2015 Imaging   CT Head w/wo Contrast 1. No acute intracranial abnormality or significant interval change. 2. Stable minimal periventricular white matter hypoattenuation on the right. This may reflect a remote ischemic injury. 3. No evidence for metastatic disease to the brain. 4. No focal soft tissue lesion to explain the patient's tenderness.   10/30/2015 Imaging  CT Abdomen Pelvis w/  Contrast 1. Few mildly prominent fluid-filled loops of small bowel scattered throughout the abdomen, with intraluminal fluid density within the distal colon. Given the provided history, findings are suggestive of acute enteritis/diarrheal illness. 2. No other acute intra-abdominal or pelvic process identified. 3. Widespread osseous metastatic disease, better evaluated on most recent PET-CT from 07/19/2015. No pathologic fracture or other complication. 4. 11 mm hypodensity within the left kidney. This lesion measures intermediate density, and is indeterminate. While this lesion is similar in size relative to recent studies, this is increased in size relative to prior study from 2012. Further evaluation with dedicated renal mass protocol CT and/or MRI is recommended for complete characterization.   12/2015 - 05/14/2016 Chemotherapy   Maintenance Herceptin and pejeta every 3 weeks stopped on 05/14/16 due to disease progression   12/25/2015 Imaging   Restaging PET Scan 1. Markedly improved skeletal activity, with only several faint foci of residual accentuated metabolic activity, but resolution of the vast majority of the previously extensive osseous metastatic disease. 2. Resolution of the prior right internal mammary and right prevascular lymph nodes. 3. Residual subcutaneous edema and edema along fascia planes in the left upper thigh. This remains somewhat more than I would expect for placement of an IM nail 6 weeks ago. If there is leg swelling further down, consider Doppler venous ultrasound to rule out left lower extremity DVT. I do not perceive an obvious difference in density between the pelvic and common femoral veins on the noncontrast CT data. 4. Chronic right maxillary and right sphenoid sinusitis.   01/16/2016 - 10/20/2017 Anti-estrogen oral therapy   Letrozole 2.5 mg daily. She was switched to exemestane due to disease progression on 02/08/17. Stopped on 10/20/2017 when treatment changed to chemo     03/01/2016 Miscellaneous   Patient presented to ED following a fall; she reports she landed on her back and is now experiencing back pain. The patient was evaluated and discharge home same day with pain control.   05/01/2016 Imaging   CT CAP IMPRESSION: 1. Stable exam.  No new or progressive disease identified. 2. No evidence for residual or recurrent adenopathy within the chest. 3. Stable bone metastasis.   05/01/2016 Imaging   BONE SCAN IMPRESSION: 1. Small focal uptake involving the anterior rib ends of the left second and right fourth ribs, new since the prior bone scan. The location of this uptake is more suggestive of a traumatic or inflammatory etiology as opposed to metastatic disease. No other evidence of metastatic disease. 2. There are other areas of stable uptake which are most likely degenerative/reactive, including right shoulder and cervical spine uptake and uptake along the right femur adjacent to the ORIF hardware.   05/20/2016 Imaging   NM PET Skull to Thigh IMPRESSION: 1. There multiple metastatic foci in the bony pelvis, including some new abnormal foci compare to the prior PET-CT, compatible with mildly progressive bony metastatic disease. Also, a left T11 vertebral hypermetabolic metastatic lesion is observed, new compared to the prior exam. 2. No active extraosseous malignancy is currently identified. 3. Right anterior fourth rib healing fracture, appears be   06/04/2016 - 01/08/2017 Chemotherapy   Second line chemotherapy Kadcyla every 3 weeks started on 06/04/16 and stopped on 01/08/17 due to mixed response.     10/05/2016 Imaging   CT CAP and Bone Scan:  Bones: left intramedullary rod and left femoral head neck screw in place. In the proximal diaphysis of the left femur there is cortical thickening and  irregularity. No lytic or blastic bone lesions. No fracture or vertebral endplate destruction.   No definite evidence of recurrent disease in the  chest, abdomen, or pelvis.   10/28/2016 Imaging   CT A/P IMPRESSION: No acute process demonstrated in the abdomen or pelvis. No evidence of bowel obstruction or inflammation.   11/04/2016 Imaging   DG foot complete left: IMPRESSION: No fracture or dislocation.  No soft tissue abnormality   11/23/2016 Imaging   CT RIGHT FEMUR IMPRESSION: Negative CT scan of the right thigh.  No visible metastatic disease.  CT LEFT FEMUR IMPRESSION: 1. Postsurgical changes related to prior cephalomedullary rod fixation of the left femur with linear lucency along the anterolateral proximal femoral cortex at level of the lesser trochanter, suspicious for nondisplaced fracture. 2. Slightly permeative appearance of the proximal left femoral cortex at the site of known prior osseous metastases is largely unchanged. 3. Unchanged patchy lucency and sclerosis along the anterior acetabulum, which appears to correspond with an area of faint uptake on the prior PET-CT, suspicious for osseous metastasis. These results will be called to the ordering clinician or representative by the Radiologist Assistant, and communication documented in the PACS or zVision Dashboard.     01/27/2017 Imaging   IMPRESSION: 1. Mixed response to osseous metastasis. Some hypermetabolic lesions are new and increasingly hypermetabolic, while another index lesion is decreasingly hypermetabolic. 2. No evidence of hypermetabolic soft tissue metastasis.    02/08/2017 - 10/19/2017 Antibody Plan   -Herceptin every 3 week since 02/08/17 -Oral Lanpantinib 154m and decreased to 5038mdue to diarrhea and depression. Due to disease progression she swithced to Verzenio 15060mn 05/04/17. She did not toelrate well so she was switched to Ibrance 100m23m 05/31/17. Due to her neutropenia, Ibrance dose reduced to 75 mg daily, 3 weeks on, one-week off, stopped on 10/20/2017 due to disease progression.    04/30/2017 PET scan   IMPRESSION: 1.  Primarily increase in hypermetabolism of osseous metastasis. An isolated right acetabular lesion is decreased in hypermetabolism since the prior exam. 2. No evidence of hypermetabolic soft tissue metastasis. 3. Similar non FDG avid right-sided thyroid nodule, favoring a benign etiology. Recommend attention on follow-up.   08/30/2017 PET scan   08/30/2017 PET Scan  IMPRESSION: 1. Worsening osseous metastatic disease. 2. Focal hypermetabolism in the central spinal canal the L1 level, similar to the prior exam. Metastatic implant cannot be excluded. 3. Slight enlargement of a right thyroid nodule which now appears more cystic in character. Consider further evaluation with thyroid ultrasound. If patient is clinically hyperthyroid, consider nuclear medicine thyroid uptake and scan   08/30/2017 Progression   08/30/2017 PET Scan  IMPRESSION: 1. Worsening osseous metastatic disease. 2. Focal hypermetabolism in the central spinal canal the L1 level, similar to the prior exam. Metastatic implant cannot be excluded. 3. Slight enlargement of a right thyroid nodule which now appears more cystic in character. Consider further evaluation with thyroid ultrasound. If patient is clinically hyperthyroid, consider nuclear medicine thyroid uptake and scan   09/29/2017 - 10/11/2017 Radiation Therapy   Pt received 27 Gy in 9 fractions directed at the areas of significant uptake noted on recent bone scan the right pelvis and right proximal femur, with Dr, KinaSondra Come10/09/2017 - 04/07/2018 Chemotherapy   -Herceptin every 3 weeks starting 02/08/17, perjeta added on 10/20/2017  -weekly Taxol started on 10/21/2017. Will reduce Taxol to 2 weeks on, 1 week off starting on 02/04/17.  Stoped due to mild disease progression  10/27/2017 Imaging   10/27/2017 CT AP IMPRESSION: 1. No acute abnormality. No evidence of bowel obstruction or acute bowel inflammation. Normal appendix. 2. Stable patchy sclerotic osseous  metastases throughout the visualized skeleton. No new or progressive metastatic disease in the abdomen or pelvis.   01/11/2018 Imaging   Whole Body Bone Scan 01/11/18  IMPRESSION: 1. Multifocal osseous metastases as described. 2. Right scapular metastasis. 3. Right superior thoracic spinous process. 4. Right sacral ala and right L5 metastases. 5. Right acetabular metastasis. 6. Right proximal femur metastasis. 7. Diffuse metastases throughout the left femur. 8. Anterior right fourth rib metastasis.   01/11/2018 Imaging   CT CAP W Contrast 01/11/18  IMPRESSION: 1. Similar-appearing osseous metastatic disease. 2. No evidence for progressed metastatic disease in the chest, abdomen or pelvis.   03/30/2018 Imaging   Bone Scan 03/30/18  IMPRESSION: Stable multifocal bony metastatic disease.   03/30/2018 Imaging    CT CAP W Contrast 03/30/18 IMPRESSION: 1. New mild contiguous wall thickening with associated mucosal hyperenhancement throughout the left colon and rectum, suggesting nonspecific infectious or inflammatory proctocolitis. Differential includes C diff colitis. 2. Stable faint patchy sclerosis in the iliac bones and lumbar vertebral bodies. No new or progressive osseous metastatic disease by CT. Please note that the osseous lesions are relatively occult by CT and would be better evaluated by PET-CT, as clinically warranted. 3. No evidence of extra osseous metastatic disease.  These results will be called to the ordering clinician or representative by the Radiologist Assistant, and communication documented in the PACS or zVision Dashboard.   04/15/2018 - 06/16/2018 Chemotherapy   Enhertu q3weeks starting 04/15/18. Stopped Enhertu after cycle 3 on 05/27/18 due to poor toleration due to poor toleration with anorexia, weight loss, N&V, diarrhea   04/21/2018 PET scan   PET 04/21/18  IMPRESSION: 1. Overall marked interval improvement in bony hypermetabolic disease. All of the  previously identified hypermetabolic osseous metastases have decreased in hypermetabolism in the interval. There is a new tiny focus of hypermetabolism in the right aspect of the L3 vertebral body that appears to correspond to a new 8 mm lucency, raising concern for new metastatic disease. Close attention on follow-up recommended. 2. Focal hypermetabolism in the central spinal canal at the level of L1 previously has resolved. 3. No hypermetabolic soft tissue disease on today's study. 4. Right thyroid nodule seen previously has decreased in the interval.    06/16/2018 Imaging   CT LUMBAR SPINE WO CONTRAST 06/16/18 IMPRESSION: 1. Transitional lumbosacral anatomy. Correlation with radiographs is recommended prior to any operative intervention. 2. Suspicion of a right L3 vertebral body metastasis on the April PET-CT is not evident by CT. Stable degenerative versus metastatic lesion at the right L5 superior endplate. Small but increased right iliac bone metastasis since March. Lumbar MRI would best characterize lumbar metastatic disease (without contrast if necessary). 3. Suspect L2-L3 left subarticular segment disc herniation. Query left L2 radiculitis. 4. Stable appearance of rightward L4-L5 disc degeneration which could affect the right foramen and right lateral recess.   06/17/2018 - 11/16/2018 Chemotherapy   Restart Taxol 2 weeks on/1 week off with Herceptin and Perjeta q3weeks on 06/17/18. Perjeta was stopped after C5 due to diarrhea. Stopped Taxol after 11/16/18 due to disease progression to L3.    07/06/2018 PET scan   PET  IMPRESSION: 1. Near total resolution of hypermetabolic activity associated with skeletal metastasis. Very mild activity associated with the LEFT iliac bone which is similar to background blood pool activity. 2. No new  or progressive disease.   11/21/2018 PET scan   IMPRESSION: 1. New focal hypermetabolic lesion eccentric to the right in the L3 vertebral body,  maximum SUV 6.1 compatible with a new focus of active malignancy. 2. Very low-grade activity similar to blood pool in previous existing mildly sclerotic metastatic lesions including the left iliac bone lesion. Some of the mildly sclerotic lesions are not hypermetabolic compatible with effectively treated bony metastatic disease. 3. No hypermetabolic soft tissue lesions are identified.     11/30/2018 - 08/25/2019 Chemotherapy   Continue Herceptin q3weeks. Stopped 08/25/19 due to significant disease progression and switched to  IV Matuzumab   12/12/2018 - 12/17/2018 Chemotherapy   Tucatinib (TUKYSA) 6 tabs BID as a next line therapy starting 12/12/18. Stopped 12/17/18 duet o N&V, poor appetite and HA    12/21/2018 - 03/17/2019 Chemotherapy   IV Eribulin on day 1, 8 every 21 days starting 12/21/18. Stopped 03/17/19 due to mixed response to treatment.    03/21/2019 PET scan   IMPRESSION: 1. Mixed response to therapy of osseous metastasis. Although a right-sided L4 lesion demonstrates decreased hypermetabolism today, new left iliac and right ischial hypermetabolic lesions are seen. 2. No evidence of hypermetabolic soft tissue metastasis.     03/27/2019 - 07/21/2019 Chemotherapy   Neratinib (Nerlynx) titrate up from 162m to 2435mstarting 03/27/19. She could not tolerate 16055mnd stopped on 02/05/19 due to N&V and diarrhea. Will restart at 120m81m 04/10/18-04/27/19 and 4/19-4/22. Reduced to 80mg30me daily on 05/10/19. Held/stopped since 07/21/19 due to dry cough, Nausea and body aches.   06/23/2019 Imaging   CT Head  IMPRESSION: 1. No acute intracranial abnormality or metastatic disease identified. 2. Chronic white matter disease in the right hemisphere is stable from prior exams.     09/05/2019 PET scan   IMPRESSION: 1. Significant progression of widespread hypermetabolic osseous metastases throughout the axial skeleton as detailed. 2. No hypermetabolic extraosseous sites of metastatic disease.    09/15/2019 -  Chemotherapy   IV Matuzumab q3weeks and IV Gemcitabine 2 weeks on/1 week off starting 09/15/19      CURRENT THERAPY:  -Zometa q3mont64monthMargetuximab q3weeks and IV Gemcitabine2 weeks on/1 week offstarting 09/15/19  INTERVAL HISTORY:  Debra MIYOKO HASHIMIre for a follow up. She presents to the clinic with her interpretor. She notes today she still does not feel well. She notes she has been more active lately and 5 days ago (same day of treatment) she picked up something and that night since she felt vertigo and chest tightness. She also has diarrhea and nausea. She notes with her chest pain she has heart racing and feels it is hard for her to catch her breath. She tried to go to ED 2 days ago but after 6 hours she left without seeing anyone. She notes her chest pain is constant and 6-8/10. She notes oxycodone is not helping with this pain even after taking 2-3 tabs at a time over the past 4 days She denies cough.  Her baseline pain is still there. She notes left calf pain with palpitation and swelling of Left leg. She notes she had this before but has progressed lately. She notes she was not able to eat or drink much. She will have diarrhea 4-5 times a day in the past week. She tried imodium and lomotil    REVIEW OF SYSTEMS:   Constitutional: Denies fevers, chills or abnormal weight loss (+) Dizziness with vertigo Eyes: Denies blurriness of vision Ears, nose,  mouth, throat, and face: Denies mucositis or sore throat  Respiratory: Denies cough or wheezes (+) SOB Cardiovascular: Denies palpitation (+) Left lower extremity swelling (+)Mid chest pain  Gastrointestinal:  Denies heartburn or change in bowel habits (+) Nausea and diarrhea Skin: Denies abnormal skin rashes Lymphatics: Denies new lymphadenopathy or easy bruising Neurological:Denies numbness, tingling or new weaknesses Behavioral/Psych: Mood is stable, no new changes  All other systems were reviewed with the patient  and are negative.  MEDICAL HISTORY:  Past Medical History:  Diagnosis Date   Abnormal Pap smear    Anxiety    Breast cancer ([place])    Cancer ([place]) 1999   breast-s/p mastectomy, chemo, rad   CIN I (cervical intraepithelial neoplasia I) 2003   by colpo   Congenital deafness    Deaf    Depression    Port-A-Cath in place 2017   for chemotherapy   Radiation 07/12/15-07/26/15   left femur 30 Gy   S/P bilateral oophorectomy     SURGICAL HISTORY: Past Surgical History:  Procedure Laterality Date   ABDOMINAL HYSTERECTOMY     INTRAMEDULLARY (IM) NAIL INTERTROCHANTERIC Left 06/26/2015   Procedure: LEFT BIOMET LONG AFFIXS NAIL;  Surgeon: [name], MD;  Location: [facility];  Service: Orthopedics;  Laterality: Left;   IR GENERIC HISTORICAL  08/06/2015   IR US GUIDE VASC ACCESS LEFT 08/06/2015 Aletta Edouard, MD [facility]   IR GENERIC HISTORICAL  08/06/2015   IR FLUORO GUIDE CV LINE LEFT 08/06/2015 Aletta Edouard, MD [facility]   IR GENERIC HISTORICAL  03/05/2016   IR CV LINE INJECTION 03/05/2016 Markus Daft, MD [facility]   LEFT HEART CATH AND CORONARY ANGIOGRAPHY N/A 12/13/2017   Procedure: LEFT HEART CATH AND CORONARY ANGIOGRAPHY;  Surgeon: Leonie Man, MD;  Location: [facility];  Service: Cardiovascular;  Laterality: N/A;   MASTECTOMY     right breast   MASTECTOMY     OVARIAN CYST REMOVAL     RADIOLOGY WITH ANESTHESIA Left 06/25/2015   Procedure: MRI OF LEFT HIP WITH OR WITHOUT CONTRAST;  Surgeon: Medication Radiologist, MD;  Location: [facility];  Service: Radiology;  Laterality: Left;  DR. MCINTYRE/MRI   TUBAL LIGATION      I have reviewed the social history and family history with the patient and they are unchanged from previous note.  ALLERGIES:  is allergic to promethazine hcl, chlorhexidine, and diphenhydramine hcl.  MEDICATIONS:  No current facility-administered medications for this visit.   Current Outpatient Medications  Medication Sig  Dispense Refill   ALPRAZolam (XANAX) 0.25 MG tablet Take 1 tablet (0.25 mg total) by mouth 2 (two) times daily as needed for anxiety. 30 tablet 0   diphenoxylate-atropine (LOMOTIL) 2.5-0.025 MG tablet Take 1-2 tablets by mouth 4 (four) times daily as needed for diarrhea or loose stools. 60 tablet 1   guaiFENesin-codeine 100-10 MG/5ML syrup Take 5 mLs by mouth every 4 (four) hours as needed for cough. 180 mL 0   lidocaine-prilocaine (EMLA) cream Apply 1 application topically as needed. Apply to Porta-Cath 1-2 hours prior to access as directed. 90 g 2   ondansetron (ZOFRAN) 8 MG tablet Take 1 tablet (8 mg total) by mouth every 8 (eight) hours as needed for nausea or vomiting. 45 tablet 1   oxyCODONE (ROXICODONE) 5 MG immediate release tablet Take 1 tablet (5 mg total) by mouth every 8 (eight) hours as needed for severe pain. 60 tablet 0   potassium chloride (KLOR-CON) 10 MEQ tablet Take 3  tablets (30 mEq total) by mouth daily. 90 tablet 2   prochlorperazine (COMPAZINE) 10 MG tablet Take 1 tablet (10 mg total) by mouth 2 (two) times daily as needed for nausea or vomiting. 10 tablet 0   zolpidem (AMBIEN) 10 MG tablet Take 1 tablet (10 mg total) by mouth at bedtime as needed for sleep. 30 tablet 0   Facility-Administered Medications Ordered in Other Visits  Medication Dose Route Frequency Provider Last Rate Last Admin   potassium chloride SA (K-DUR) CR tablet 40 mEq  40 mEq Oral Once Truitt Merle, MD       sodium chloride 0.9 % 1,000 mL with potassium chloride 10 mEq infusion   Intravenous Continuous Livesay, Tamala Julian, MD   Stopped at 09/10/15 1653   sodium chloride 0.9 % injection 10 mL  10 mL Intravenous PRN Livesay, Lennis P, MD        PHYSICAL EXAMINATION: ECOG PERFORMANCE STATUS: 2 - Symptomatic, <50% confined to bed  Vitals:   10/25/19 0949 10/25/19 1015  BP: (!) 115/51 112/69  Pulse: 89 83  Resp: 20 20  Temp: 97.7 F (36.5 C)   SpO2: 99% 99%   Filed Weights   10/25/19 0949   Weight: 131 lb 11.2 oz (59.7 kg)    GENERAL:alert, no distress and comfortable SKIN: skin color, texture, turgor are normal, no rashes or significant lesions EYES: normal, Conjunctiva are pink and non-injected, sclera clear  NECK: supple, thyroid normal size, non-tender, without nodularity LYMPH:  no palpable lymphadenopathy in the cervical, axillary  LUNGS: clear to auscultation and percussion with normal breathing effort HEART: regular rate & rhythm and no murmurs (+) Very mild left lower extremity edema with calf tenderness on palpation ABDOMEN:abdomen soft, non-tender and normal bowel sounds Musculoskeletal:no cyanosis of digits and no clubbing  NEURO: alert & oriented x 3 with fluent speech, no focal motor/sensory deficits  LABORATORY DATA:  I have reviewed the data as listed CBC Latest Ref Rng & Units 10/25/2019 10/25/2019 10/20/2019  WBC 4.0 - 10.5 K/uL 19.0(H) 22.1(H) 2.7(L)  Hemoglobin 12.0 - 15.0 g/dL 8.4(L) 9.1(L) 9.4(L)  Hematocrit 36 - 46 % 25.7(L) 26.8(L) 27.8(L)  Platelets 150 - 400 K/uL 73(L) 77(L) 133(L)     CMP Latest Ref Rng & Units 10/25/2019 10/25/2019 10/20/2019  Glucose 70 - 99 mg/dL 85 89 72  BUN 6 - 20 mg/dL 10 11 12   Creatinine 0.44 - 1.00 mg/dL 0.78 0.85 0.85  Sodium 135 - 145 mmol/L 139 139 139  Potassium 3.5 - 5.1 mmol/L 3.3(L) 3.4(L) 3.4(L)  Chloride 98 - 111 mmol/L 109 106 106  CO2 22 - 32 mmol/L 23 29 26   Calcium 8.9 - 10.3 mg/dL 7.8(L) 9.1 8.8(L)  Total Protein 6.5 - 8.1 g/dL - 6.4(L) 6.4(L)  Total Bilirubin 0.3 - 1.2 mg/dL - 1.6(H) 1.8(H)  Alkaline Phos 38 - 126 U/L - 152(H) 69  AST 15 - 41 U/L - 17 15  ALT 0 - 44 U/L - 19 23      RADIOGRAPHIC STUDIES: I have personally reviewed the radiological images as listed and agreed with the findings in the report. DG Chest 2 View  Result Date: 10/25/2019 CLINICAL DATA:  Cancer patient, shortness of breath EXAM: CHEST - 2 VIEW COMPARISON:  Radiograph 07/21/2019, CT 10/12/2018 FINDINGS: Accessed  left IJ approach Port-A-Cath with tip at the superior cavoatrial junction. Postsurgical changes from prior right mastectomy. Telemetry leads overlie the chest. No acute osseous or soft tissue abnormality. Degenerative changes are present in the  imaged spine and shoulders. Chronic elevation of left hemidiaphragm. No consolidation, features of edema, pneumothorax, or effusion. No concerning pulmonary nodules or masses. The cardiomediastinal contours are unremarkable. IMPRESSION: No acute cardiopulmonary abnormality. No worrisome pulmonary lesions. Chronic elevation of left hemidiaphragm. Stable satisfactory positioning of the left IJ approach Port-A-Cath. Prior right mastectomy. Electronically Signed   By: Lovena Le M.D.   On: 10/25/2019 15:54   VAS [place] LOWER EXTREMITY VENOUS (DVT) ([facility] 7a-7p)  Result Date: 10/25/2019  Lower Venous DVTStudy Indications: Pain, Swelling, and SOB. Other Indications: Rod in leg x4 years. Comparison Study: No prior studies. Performing Technologist: Darlin Coco  Examination Guidelines: A complete evaluation includes B-mode imaging, spectral Doppler, color Doppler, and power Doppler as needed of all accessible portions of each vessel. Bilateral testing is considered an integral part of a complete examination. Limited examinations for reoccurring indications may be performed as noted. The reflux portion of the exam is performed with the patient in reverse Trendelenburg.  +-----+---------------+---------+-----------+----------+--------------+  RIGHT Compressibility Phasicity Spontaneity Properties Thrombus Aging  +-----+---------------+---------+-----------+----------+--------------+  CFV   Full            Yes       Yes                                    +-----+---------------+---------+-----------+----------+--------------+   +---------+---------------+---------+-----------+----------+--------------+  LEFT      Compressibility Phasicity Spontaneity Properties Thrombus Aging   +---------+---------------+---------+-----------+----------+--------------+  CFV       Full            Yes       Yes                                    +---------+---------------+---------+-----------+----------+--------------+  SFJ       Full                                                             +---------+---------------+---------+-----------+----------+--------------+  FV Prox   Full                                                             +---------+---------------+---------+-----------+----------+--------------+  FV Mid    Full                                                             +---------+---------------+---------+-----------+----------+--------------+  FV Distal Full                                                             +---------+---------------+---------+-----------+----------+--------------+  PFV  Full                                                             +---------+---------------+---------+-----------+----------+--------------+  POP       Full            Yes       Yes                                    +---------+---------------+---------+-----------+----------+--------------+  PTV       Full                                                             +---------+---------------+---------+-----------+----------+--------------+  PERO      Full                                                             +---------+---------------+---------+-----------+----------+--------------+  Soleal    Full                                                             +---------+---------------+---------+-----------+----------+--------------+  Gastroc   Full                                                             +---------+---------------+---------+-----------+----------+--------------+     Summary: RIGHT: - No evidence of common femoral vein obstruction.  LEFT: - There is no evidence of deep vein thrombosis in the lower extremity.  - No cystic structure found in the popliteal fossa.   *See table(s) above for measurements and observations.    Preliminary      ASSESSMENT & PLAN:  [name] is a 54 y.o. female with    1. Symptom Management: Nausea and Diarrhea, Chest pain with SOB, Dehydration, LLE swelling -Since the night of her last chemo treatment on 10/20/19 and lifting heavy items she has had Nausea and diarrhea not controlled with imodium or lomotil. This has lead to low oral intake and dehydration with dizziness.  -She also has had constant mid chest pain with SOB (6-8/10). Oxycodone did not touch this pain.  -On exam today (10/25/19) she has mild LLE edema with calf tenderness with palpation. I recommend CT angio chest and doppler for further work up to rule out PE and DVT. She is agreeable.  -Will obtain orthostatic BP/vital signs. For dehydration I will ive IV Fluids and obtain labs. She is agreeable.    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 -Last treatment Gemcitabine alone on 10/20/19  2.Body pain, secondary to bone mets, Hypocalcemia, leg cramps  3.Congenital deafness 4.Mild anemia, secondary to chemotherapy and malignancy 5.Hypokalemia, Hypomagnesium, secondary toDiarrhea 6.Goal of care discussion, DNR/DNI 7.Insomnia 8. NeuropathyG1 9. Mildhyperbilirubinemia    PLAN: -Give IV Fluids NS 1L over 2 hr  -iv zofran and dexa 58m each, iv morphine 25monce  -LLE doppler and CT chest to rule out DVT and PE ordered, pending pre-auth  -Lab today CBC, CMP and BNP    No problem-specific Assessment & Plan notes found for this encounter.   Orders Placed This Encounter  Procedures   CT ANGIO CHEST PE W OR WO CONTRAST    Standing Status:   Future    Standing Expiration Date:   10/24/2020    Order Specific Question:   If indicated for the ordered procedure, I authorize  the administration of contrast media per Radiology protocol    Answer:   Yes    Order Specific Question:   Is patient pregnant?    Answer:   No    Order Specific Question:   Preferred imaging location?    Answer[facility] (Brain natriuretic peptide)    Standing Status:   Future    Number of Occurrences:   1    Standing Expiration Date:   10/24/2020    All questions were answered. The patient knows to call the clinic with any problems, questions or concerns. No barriers to learning was detected. The total time spent in the appointment was 40 minutes.     YaTruitt MerleMD 10/25/2019   I, AmJoslyn Devonam acting as scribe for YaTruitt MerleMD.   I have reviewed the above documentation for accuracy and completeness, and I agree with the above.   Addendum  Patient had persistent severe chest pain in infusion room, even after IV morphine 2 mg twice within 30 minutes, blood pressure slightly low with systolic in the 9549-449 Pulse ox was normal. Pt appears to be anxious.  No fever or diaphoresis.  I was not able to get a CTA and Doppler done right away due to the pending preauthorization.  I recommend ED evaluation, [facility] and we sent her over in a wheelchair.  I will follow if she gets admitted.  YaTruitt Merle10/13/2021

## 2019-10-25 ENCOUNTER — Inpatient Hospital Stay (HOSPITAL_BASED_OUTPATIENT_CLINIC_OR_DEPARTMENT_OTHER): Payer: Medicaid Other | Admitting: Hematology

## 2019-10-25 ENCOUNTER — Other Ambulatory Visit: Payer: Medicaid Other

## 2019-10-25 ENCOUNTER — Emergency Department (HOSPITAL_COMMUNITY)
Admission: EM | Admit: 2019-10-25 | Discharge: 2019-10-25 | Disposition: A | Discharge status: Home / Self Care | Payer: Medicaid Other | Attending: Emergency Medicine | Admitting: Emergency Medicine

## 2019-10-25 ENCOUNTER — Other Ambulatory Visit: Payer: Self-pay

## 2019-10-25 ENCOUNTER — Emergency Department (HOSPITAL_COMMUNITY): Payer: Medicaid Other

## 2019-10-25 ENCOUNTER — Encounter (HOSPITAL_COMMUNITY): Payer: Self-pay

## 2019-10-25 ENCOUNTER — Emergency Department (HOSPITAL_BASED_OUTPATIENT_CLINIC_OR_DEPARTMENT_OTHER): Payer: Medicaid Other

## 2019-10-25 ENCOUNTER — Encounter: Payer: Self-pay | Admitting: Hematology

## 2019-10-25 ENCOUNTER — Inpatient Hospital Stay: Payer: Medicaid Other

## 2019-10-25 ENCOUNTER — Encounter (HOSPITAL_COMMUNITY): Payer: Self-pay | Admitting: Radiology

## 2019-10-25 VITALS — BP 97/75 | HR 70 | Temp 98.0°F | Resp 22

## 2019-10-25 VITALS — BP 112/69 | HR 83 | Temp 97.7°F | Resp 20 | Ht 65.0 in | Wt 131.7 lb

## 2019-10-25 DIAGNOSIS — Z9049 Acquired absence of other specified parts of digestive tract: Secondary | ICD-10-CM | POA: Diagnosis not present

## 2019-10-25 DIAGNOSIS — C50911 Malignant neoplasm of unspecified site of right female breast: Secondary | ICD-10-CM

## 2019-10-25 DIAGNOSIS — C7951 Secondary malignant neoplasm of bone: Secondary | ICD-10-CM

## 2019-10-25 DIAGNOSIS — M79662 Pain in left lower leg: Secondary | ICD-10-CM | POA: Diagnosis not present

## 2019-10-25 DIAGNOSIS — M79605 Pain in left leg: Secondary | ICD-10-CM

## 2019-10-25 DIAGNOSIS — Z853 Personal history of malignant neoplasm of breast: Secondary | ICD-10-CM | POA: Insufficient documentation

## 2019-10-25 DIAGNOSIS — R0602 Shortness of breath: Secondary | ICD-10-CM | POA: Diagnosis not present

## 2019-10-25 DIAGNOSIS — R079 Chest pain, unspecified: Secondary | ICD-10-CM | POA: Diagnosis not present

## 2019-10-25 DIAGNOSIS — R0609 Other forms of dyspnea: Secondary | ICD-10-CM

## 2019-10-25 DIAGNOSIS — M79609 Pain in unspecified limb: Secondary | ICD-10-CM | POA: Diagnosis not present

## 2019-10-25 DIAGNOSIS — M7989 Other specified soft tissue disorders: Secondary | ICD-10-CM | POA: Diagnosis not present

## 2019-10-25 DIAGNOSIS — R0600 Dyspnea, unspecified: Secondary | ICD-10-CM | POA: Diagnosis not present

## 2019-10-25 DIAGNOSIS — R0789 Other chest pain: Secondary | ICD-10-CM | POA: Diagnosis present

## 2019-10-25 DIAGNOSIS — Z5112 Encounter for antineoplastic immunotherapy: Secondary | ICD-10-CM | POA: Diagnosis not present

## 2019-10-25 DIAGNOSIS — Z95828 Presence of other vascular implants and grafts: Secondary | ICD-10-CM

## 2019-10-25 DIAGNOSIS — J9811 Atelectasis: Secondary | ICD-10-CM | POA: Diagnosis not present

## 2019-10-25 DIAGNOSIS — Z20822 Contact with and (suspected) exposure to covid-19: Secondary | ICD-10-CM | POA: Insufficient documentation

## 2019-10-25 LAB — BASIC METABOLIC PANEL
Anion gap: 7 (ref 5–15)
BUN: 10 mg/dL (ref 6–20)
CO2: 23 mmol/L (ref 22–32)
Calcium: 7.8 mg/dL — ABNORMAL LOW (ref 8.9–10.3)
Chloride: 109 mmol/L (ref 98–111)
Creatinine, Ser: 0.78 mg/dL (ref 0.44–1.00)
GFR, Estimated: >60 mL/min (ref >60)
Glucose, Bld: 85 mg/dL (ref 70–99)
Potassium: 3.3 mmol/L — ABNORMAL LOW (ref 3.5–5.1)
Sodium: 139 mmol/L (ref 135–145)

## 2019-10-25 LAB — RESPIRATORY PANEL BY RT PCR (FLU A&B, COVID)
Influenza A by PCR: NEGATIVE
Influenza B by PCR: NEGATIVE
SARS Coronavirus 2 by RT PCR: NEGATIVE

## 2019-10-25 LAB — CMP (CANCER CENTER ONLY)
ALT: 19 U/L (ref 0–44)
AST: 17 U/L (ref 15–41)
Albumin: 3.6 g/dL (ref 3.5–5.0)
Alkaline Phosphatase: 152 U/L — ABNORMAL HIGH (ref 38–126)
Anion gap: 4 — ABNORMAL LOW (ref 5–15)
BUN: 11 mg/dL (ref 6–20)
CO2: 29 mmol/L (ref 22–32)
Calcium: 9.1 mg/dL (ref 8.9–10.3)
Chloride: 106 mmol/L (ref 98–111)
Creatinine: 0.85 mg/dL (ref 0.44–1.00)
GFR, Estimated: >60 mL/min (ref >60)
Glucose, Bld: 89 mg/dL (ref 70–99)
Potassium: 3.4 mmol/L — ABNORMAL LOW (ref 3.5–5.1)
Sodium: 139 mmol/L (ref 135–145)
Total Bilirubin: 1.6 mg/dL — ABNORMAL HIGH (ref 0.3–1.2)
Total Protein: 6.4 g/dL — ABNORMAL LOW (ref 6.5–8.1)

## 2019-10-25 LAB — CBC WITH DIFFERENTIAL (CANCER CENTER ONLY)
Abs Immature Granulocytes: 1.63 10*3/uL — ABNORMAL HIGH (ref 0.00–0.07)
Basophils Absolute: 0.2 10*3/uL — ABNORMAL HIGH (ref 0.0–0.1)
Basophils Relative: 1 %
Eosinophils Absolute: 0.1 10*3/uL (ref 0.0–0.5)
Eosinophils Relative: 1 %
HCT: 26.8 % — ABNORMAL LOW (ref 36.0–46.0)
Hemoglobin: 9.1 g/dL — ABNORMAL LOW (ref 12.0–15.0)
Immature Granulocytes: 7 %
Lymphocytes Relative: 8 %
Lymphs Abs: 1.7 10*3/uL (ref 0.7–4.0)
MCH: 31.4 pg (ref 26.0–34.0)
MCHC: 34 g/dL (ref 30.0–36.0)
MCV: 92.4 fL (ref 80.0–100.0)
Monocytes Absolute: 1.3 10*3/uL — ABNORMAL HIGH (ref 0.1–1.0)
Monocytes Relative: 6 %
Neutro Abs: 17.2 10*3/uL — ABNORMAL HIGH (ref 1.7–7.7)
Neutrophils Relative %: 77 %
Platelet Count: 77 10*3/uL — ABNORMAL LOW (ref 150–400)
RBC: 2.9 MIL/uL — ABNORMAL LOW (ref 3.87–5.11)
RDW: 16.2 % — ABNORMAL HIGH (ref 11.5–15.5)
WBC Count: 22.1 10*3/uL — ABNORMAL HIGH (ref 4.0–10.5)
nRBC: 0.1 % (ref 0.0–0.2)

## 2019-10-25 LAB — CBC WITH DIFFERENTIAL/PLATELET
Abs Immature Granulocytes: 1.43 10*3/uL — ABNORMAL HIGH (ref 0.00–0.07)
Basophils Absolute: 0.2 10*3/uL — ABNORMAL HIGH (ref 0.0–0.1)
Basophils Relative: 1 %
Eosinophils Absolute: 0.1 10*3/uL (ref 0.0–0.5)
Eosinophils Relative: 0 %
HCT: 25.7 % — ABNORMAL LOW (ref 36.0–46.0)
Hemoglobin: 8.4 g/dL — ABNORMAL LOW (ref 12.0–15.0)
Immature Granulocytes: 8 %
Lymphocytes Relative: 8 %
Lymphs Abs: 1.5 10*3/uL (ref 0.7–4.0)
MCH: 31 pg (ref 26.0–34.0)
MCHC: 32.7 g/dL (ref 30.0–36.0)
MCV: 94.8 fL (ref 80.0–100.0)
Monocytes Absolute: 0.8 10*3/uL (ref 0.1–1.0)
Monocytes Relative: 4 %
Neutro Abs: 15 10*3/uL — ABNORMAL HIGH (ref 1.7–7.7)
Neutrophils Relative %: 79 %
Platelets: 73 10*3/uL — ABNORMAL LOW (ref 150–400)
RBC: 2.71 MIL/uL — ABNORMAL LOW (ref 3.87–5.11)
RDW: 16.1 % — ABNORMAL HIGH (ref 11.5–15.5)
WBC: 19 10*3/uL — ABNORMAL HIGH (ref 4.0–10.5)
nRBC: 0.3 % — ABNORMAL HIGH (ref 0.0–0.2)

## 2019-10-25 LAB — BRAIN NATRIURETIC PEPTIDE
B Natriuretic Peptide: 58.1 pg/mL (ref 0.0–100.0)
B Natriuretic Peptide: 241.7 pg/mL — ABNORMAL HIGH (ref 0.0–100.0)

## 2019-10-25 LAB — TROPONIN I (HIGH SENSITIVITY)
Troponin I (High Sensitivity): 5 ng/L (ref <18)
Troponin I (High Sensitivity): 6 ng/L (ref <18)

## 2019-10-25 MED ORDER — SODIUM CHLORIDE 0.9 % IV SOLN
INTRAVENOUS | Status: DC
  Filled 2019-10-25: qty 250

## 2019-10-25 MED ORDER — IOHEXOL 350 MG/ML SOLN
75.0000 mL | Freq: Once | INTRAVENOUS | Status: AC | PRN
  Administered 2019-10-25: 75 mL via INTRAVENOUS

## 2019-10-25 MED ORDER — FENTANYL CITRATE (PF) 100 MCG/2ML IJ SOLN
50.0000 ug | Freq: Once | INTRAMUSCULAR | Status: AC
  Administered 2019-10-25: 50 ug via INTRAVENOUS
  Filled 2019-10-25: qty 2

## 2019-10-25 MED ORDER — MORPHINE SULFATE (PF) 2 MG/ML IV SOLN
INTRAVENOUS | Status: AC
  Filled 2019-10-25: qty 1

## 2019-10-25 MED ORDER — MORPHINE SULFATE (PF) 2 MG/ML IV SOLN
2.0000 mg | Freq: Once | INTRAVENOUS | Status: AC
  Administered 2019-10-25: 2 mg via INTRAVENOUS

## 2019-10-25 MED ORDER — HEPARIN SOD (PORK) LOCK FLUSH 100 UNIT/ML IV SOLN
500.0000 [IU] | Freq: Once | INTRAVENOUS | Status: AC
  Administered 2019-10-25: 500 [IU]
  Filled 2019-10-25: qty 5

## 2019-10-25 MED ORDER — SODIUM CHLORIDE 0.9 % IV SOLN
Freq: Once | INTRAVENOUS | Status: AC
  Filled 2019-10-25: qty 250

## 2019-10-25 NOTE — Progress Notes (Signed)
11:40 patient continues to complain chest pain/tightness unrelieved by 2 mg morphine IV. Patient stated vertigo has improved. Placed patient on 2L O2 d/t continued complaint of chest pain. Made Dr. Burr Medico aware of unrelieved chest pain and received verbal order for an addition morphine 2 mg IV.

## 2019-10-25 NOTE — ED Triage Notes (Signed)
Pt brought over from cancer center by [facility]. Pt reports chest pain and SHOB since Saturday that has been getting progressively worse. Pt reports going to the ED on Monday but left due to wait times. Pt also reports left leg pain which she has previously had rods placed. Per cancer center RN two EKGs were performed and they were normal. Pts port is accessed. Pt has received 2 doses of 2mg  morphine and a liter of NS. Blood work was done at cancer center. Pt is deaf and has a sign language interpreter at the bedside.

## 2019-10-25 NOTE — Progress Notes (Signed)
1300-Patient is complaining of more pain in her chest which is constricting, tight, and pulling at a level of 8/9. She said when it tightens, she becomes short of breath.  She said it was getting worse.  Dr. Burr Medico and [facility] called to assess.  EKG done but was normal.  Dr. Burr Medico came and wants to send her to the ED for doppler scans and further assessment.  Her leg pain is also worse and is on a scale of 4/5 in her upper leg.  Pain is worse when she is not chatting via sign language with her interpreter. Will call report to ED charge nurse when patient goes over. Gardiner Rhyme, RN

## 2019-10-25 NOTE — ED Provider Notes (Signed)
[facility] DEPT Provider Note   CSN: [id number] Arrival date & time: 10/25/19  1351     History Chief Complaint  Patient presents with  . Shortness of Breath  . Chest Pain    [name] is a 54 y.o. female.  Presents to ER with concern for chest pain and shortness of breath.  History of metastatic breast cancer to bone, currently on chemotherapy.  Seen in office today, sent to ER due to symptoms.  She reports symptoms since Sunday, relatively constant, no alleviating or aggravating factors, occurring at rest and with exertion.  Describes it as chest tightness sensation mild to moderate, associated with difficulty breathing.  Worse with deep breath.  No fevers or cough.  Patient is also noted some pain and swelling to her left leg, worse in her thigh and calf.  No falls recently.  HPI     Past Medical History:  Diagnosis Date  . Abnormal Pap smear   . Anxiety   . Breast cancer ([place])   . Cancer ([place]) 1999   breast-s/p mastectomy, chemo, rad  . CIN I (cervical intraepithelial neoplasia I) 2003   by colpo  . Congenital deafness   . Deaf   . Depression   . Port-A-Cath in place 2017   for chemotherapy  . Radiation 07/12/15-07/26/15   left femur 30 Gy  . S/P bilateral oophorectomy     Patient Active Problem List   Diagnosis Date Noted  . Chest pain 12/18/2017  . Atypical angina ([place]) 12/13/2017  . Abnormal stress echocardiogram 12/08/2017  . Bilateral thigh pain 11/19/2016  . Goals of care, counseling/discussion 03/05/2016  . MRSA (methicillin resistant Staphylococcus aureus) infection 01/18/2016  . Anemia due to antineoplastic chemotherapy 12/11/2015  . Lymphedema of left leg 12/11/2015  . Osteopenia of multiple sites 11/26/2015  . Chemotherapy-induced peripheral neuropathy ([place]) 11/03/2015  . Port catheter in place 09/26/2015  . Chemotherapy induced nausea and vomiting 08/13/2015  . Cancer related pain 08/13/2015  . Carcinoma of  breast metastatic to bone, right ([place]) 07/14/2015  . S/P TRAM (transverse rectus abdominis muscle) flap breast reconstruction 07/14/2015  . Thyroid nodule 07/09/2015  . Depression   . Generalized anxiety disorder   . Esophageal reflux   . Genetic testing 04/04/2015  . Congenital deafness 02/26/2015  . Premature surgical menopause 02/26/2015  . Benign paroxysmal positional vertigo 07/24/2014  . Migraine 07/24/2014  . DIZZINESS, CHRONIC 01/25/2007  . Insomnia 01/25/2007    Past Surgical History:  Procedure Laterality Date  . ABDOMINAL HYSTERECTOMY    . INTRAMEDULLARY (IM) NAIL INTERTROCHANTERIC Left 06/26/2015   Procedure: LEFT BIOMET LONG AFFIXS NAIL;  Surgeon: [name], MD;  Location: [facility];  Service: Orthopedics;  Laterality: Left;  . IR GENERIC HISTORICAL  08/06/2015   IR US GUIDE VASC ACCESS LEFT 08/06/2015 Aletta Edouard, MD [facility]-INTER[facility] RAD  . IR GENERIC HISTORICAL  08/06/2015   IR FLUORO GUIDE CV LINE LEFT 08/06/2015 Aletta Edouard, MD [facility]  . IR GENERIC HISTORICAL  03/05/2016   IR CV LINE INJECTION 03/05/2016 Markus Daft, MD [facility]  . LEFT HEART CATH AND CORONARY ANGIOGRAPHY N/A 12/13/2017   Procedure: LEFT HEART CATH AND CORONARY ANGIOGRAPHY;  Surgeon: Leonie Man, MD;  Location: [facility];  Service: Cardiovascular;  Laterality: N/A;  . MASTECTOMY     right breast  . MASTECTOMY    . OVARIAN CYST REMOVAL    . RADIOLOGY WITH ANESTHESIA Left 06/25/2015   Procedure: MRI  OF LEFT HIP WITH OR WITHOUT CONTRAST;  Surgeon: Medication Radiologist, MD;  Location: [facility];  Service: Radiology;  Laterality: Left;  DR. MCINTYRE/MRI  . TUBAL LIGATION       OB History    Gravida  2   Para  2   Term  1   Preterm  1   AB  0   Living  2     SAB  0   TAB  0   Ectopic  0   Multiple  0   Live Births              Family History  Problem Relation Age of Onset  . Hypertension Mother   . Seizures Mother   . Alzheimer's disease Maternal Uncle   .  Pancreatitis Maternal Grandmother   . Heart attack Maternal Grandfather     Social History   Tobacco Use  . Smoking status: Never Smoker  . Smokeless tobacco: Never Used  Substance Use Topics  . Alcohol use: No  . Drug use: No    Home Medications Prior to Admission medications   Medication Sig Start Date End Date Taking? Authorizing Provider  ALPRAZolam (XANAX) 0.25 MG tablet Take 1 tablet (0.25 mg total) by mouth 2 (two) times daily as needed for anxiety. 07/07/19   Tanner, Lyndon Code., [name]  diphenoxylate-atropine (LOMOTIL) 2.5-0.025 MG tablet Take 1-2 tablets by mouth 4 (four) times daily as needed for diarrhea or loose stools. 04/07/19   Truitt Merle, MD  guaiFENesin-codeine 100-10 MG/5ML syrup Take 5 mLs by mouth every 4 (four) hours as needed for cough. 07/21/19   Tanner, Lyndon Code., [name]  lidocaine-prilocaine (EMLA) cream Apply 1 application topically as needed. Apply to Porta-Cath 1-2 hours prior to access as directed. 04/14/19   Truitt Merle, MD  ondansetron (ZOFRAN) 8 MG tablet Take 1 tablet (8 mg total) by mouth every 8 (eight) hours as needed for nausea or vomiting. 07/07/19   Tanner, Lyndon Code., [name]  oxyCODONE (ROXICODONE) 5 MG immediate release tablet Take 1 tablet (5 mg total) by mouth every 8 (eight) hours as needed for severe pain. 10/20/19   Truitt Merle, MD  potassium chloride (KLOR-CON) 10 MEQ tablet Take 3 tablets (30 mEq total) by mouth daily. 09/07/19   Truitt Merle, MD  prochlorperazine (COMPAZINE) 10 MG tablet Take 1 tablet (10 mg total) by mouth 2 (two) times daily as needed for nausea or vomiting. 01/03/19   Charlann Lange, [name]  zolpidem (AMBIEN) 10 MG tablet Take 1 tablet (10 mg total) by mouth at bedtime as needed for sleep. 10/13/19 11/12/19  Truitt Merle, MD    Allergies    Promethazine hcl, Chlorhexidine, and Diphenhydramine hcl  Review of Systems   Review of Systems  Constitutional: Negative for chills and fever.  HENT: Negative for ear pain and sore throat.   Eyes: Negative for pain  and visual disturbance.  Respiratory: Positive for chest tightness and shortness of breath. Negative for cough.   Cardiovascular: Positive for chest pain. Negative for palpitations.  Gastrointestinal: Negative for abdominal pain and vomiting.  Genitourinary: Negative for dysuria and hematuria.  Musculoskeletal: Negative for arthralgias and back pain.  Skin: Negative for color change and rash.  Neurological: Negative for seizures and syncope.  All other systems reviewed and are negative.   Physical Exam Updated Vital Signs BP 120/64   Pulse 73   Temp 98.8 F (37.1 C) (Oral)   Resp (!) 29   SpO2 99%   Physical Exam  Vitals and nursing note reviewed.  Constitutional:      General: She is not in acute distress.    Appearance: She is well-developed.  HENT:     Head: Normocephalic and atraumatic.  Eyes:     Conjunctiva/sclera: Conjunctivae normal.  Cardiovascular:     Rate and Rhythm: Normal rate and regular rhythm.     Heart sounds: No murmur heard.   Pulmonary:     Effort: Pulmonary effort is normal. No respiratory distress.     Breath sounds: Normal breath sounds.  Chest:     Comments: Port site appears clean dry and intact Abdominal:     Palpations: Abdomen is soft.     Tenderness: There is no abdominal tenderness.  Musculoskeletal:     Cervical back: Neck supple.     Comments: Some tenderness over her left thigh, calf, no significant edema noted, normal DP/PT pulses  Skin:    General: Skin is warm and dry.  Neurological:     General: No focal deficit present.     Mental Status: She is alert.  Psychiatric:        Mood and Affect: Mood normal.     ED Results / Procedures / Treatments   Labs (all labs ordered are listed, but only abnormal results are displayed) Labs Reviewed  CBC WITH DIFFERENTIAL/PLATELET - Abnormal; Notable for the following components:      Result Value   WBC 19.0 (*)    RBC 2.71 (*)    Hemoglobin 8.4 (*)    HCT 25.7 (*)    RDW 16.1 (*)      Platelets 73 (*)    nRBC 0.3 (*)    Neutro Abs 15.0 (*)    Basophils Absolute 0.2 (*)    Abs Immature Granulocytes 1.43 (*)    All other components within normal limits  BASIC METABOLIC PANEL - Abnormal; Notable for the following components:   Potassium 3.3 (*)    Calcium 7.8 (*)    All other components within normal limits  BRAIN NATRIURETIC PEPTIDE - Abnormal; Notable for the following components:   B Natriuretic Peptide 241.7 (*)    All other components within normal limits  RESPIRATORY PANEL BY RT PCR (FLU A&B, COVID)  TROPONIN I (HIGH SENSITIVITY)  TROPONIN I (HIGH SENSITIVITY)    EKG October 24 1357 Sinus rhythm rate 71, no ST segment or T wave changes, normal intervals and normal axis None  Radiology DG Chest 2 View  Result Date: 10/25/2019 CLINICAL DATA:  Cancer patient, shortness of breath EXAM: CHEST - 2 VIEW COMPARISON:  Radiograph 07/21/2019, CT 10/12/2018 FINDINGS: Accessed left IJ approach Port-A-Cath with tip at the superior cavoatrial junction. Postsurgical changes from prior right mastectomy. Telemetry leads overlie the chest. No acute osseous or soft tissue abnormality. Degenerative changes are present in the imaged spine and shoulders. Chronic elevation of left hemidiaphragm. No consolidation, features of edema, pneumothorax, or effusion. No concerning pulmonary nodules or masses. The cardiomediastinal contours are unremarkable. IMPRESSION: No acute cardiopulmonary abnormality. No worrisome pulmonary lesions. Chronic elevation of left hemidiaphragm. Stable satisfactory positioning of the left IJ approach Port-A-Cath. Prior right mastectomy. Electronically Signed   By: Lovena Le M.D.   On: 10/25/2019 15:54   VAS [place] LOWER EXTREMITY VENOUS (DVT) ([facility] 7a-7p)  Result Date: 10/25/2019  Lower Venous DVTStudy Indications: Pain, Swelling, and SOB. Other Indications: Rod in leg x4 years. Comparison Study: No prior studies. Performing Technologist: Darlin Coco   Examination Guidelines: A complete evaluation  includes B-mode imaging, spectral Doppler, color Doppler, and power Doppler as needed of all accessible portions of each vessel. Bilateral testing is considered an integral part of a complete examination. Limited examinations for reoccurring indications may be performed as noted. The reflux portion of the exam is performed with the patient in reverse Trendelenburg.  +-----+---------------+---------+-----------+----------+--------------+ RIGHTCompressibilityPhasicitySpontaneityPropertiesThrombus Aging +-----+---------------+---------+-----------+----------+--------------+ CFV  Full           Yes      Yes                                 +-----+---------------+---------+-----------+----------+--------------+   +---------+---------------+---------+-----------+----------+--------------+ LEFT     CompressibilityPhasicitySpontaneityPropertiesThrombus Aging +---------+---------------+---------+-----------+----------+--------------+ CFV      Full           Yes      Yes                                 +---------+---------------+---------+-----------+----------+--------------+ SFJ      Full                                                        +---------+---------------+---------+-----------+----------+--------------+ FV Prox  Full                                                        +---------+---------------+---------+-----------+----------+--------------+ FV Mid   Full                                                        +---------+---------------+---------+-----------+----------+--------------+ FV DistalFull                                                        +---------+---------------+---------+-----------+----------+--------------+ PFV      Full                                                        +---------+---------------+---------+-----------+----------+--------------+ POP      Full           Yes       Yes                                 +---------+---------------+---------+-----------+----------+--------------+ PTV      Full                                                        +---------+---------------+---------+-----------+----------+--------------+  PERO     Full                                                        +---------+---------------+---------+-----------+----------+--------------+ Soleal   Full                                                        +---------+---------------+---------+-----------+----------+--------------+ Gastroc  Full                                                        +---------+---------------+---------+-----------+----------+--------------+     Summary: RIGHT: - No evidence of common femoral vein obstruction.  LEFT: - There is no evidence of deep vein thrombosis in the lower extremity.  - No cystic structure found in the popliteal fossa.  *See table(s) above for measurements and observations.    Preliminary     Procedures Procedures (including critical care time)  Medications Ordered in ED Medications  fentaNYL (SUBLIMAZE) injection 50 mcg (50 mcg Intravenous Given 10/25/19 1504)    ED Course  I have reviewed the triage vital signs and the nursing notes.  Pertinent labs & imaging results that were available during my care of the patient were reviewed by me and considered in my medical decision making (see chart for details).    MDM Rules/Calculators/A&P                         54 year old lady presenting to the emergency room with concern for left leg pain as well as chest pain and shortness of breath.  Notable medical history of breast cancer, metastatic, on chemotherapy.  Her EKG without acute ischemic changes, initial troponin within normal limits, doubt ACS.  Chest x-ray negative, DVT study negative.  Given her higher risk of PE with cancer diagnosis, description of chest pain and shortness of breath, will check CTA  chest to rule out pulmonary embolism.  Signed out to [facility] pending CT and reassessment.   Final Clinical Impression(s) / ED Diagnoses Final diagnoses:  Chest pain, unspecified type  Shortness of breath  Pain of left lower extremity    Rx / DC Orders ED Discharge Orders    None       Lucrezia Starch, MD 10/25/19 1709

## 2019-10-25 NOTE — ED Provider Notes (Signed)
  Physical Exam  BP 113/74   Pulse 70   Temp 98.8 F (37.1 C) (Oral)   Resp (!) 23   SpO2 98%   Physical Exam  ED Course/Procedures     Procedures  MDM     Received care of patient at 4 PM from Dr. Roslynn Amble.  Please see his note for prior history, physical and care.  Briefly this is a 54 year old female with metastatic breast cancer who presented with concern for chest pain and shortness of breath.  She is hemodynamically stable.  Had a negative DVT study.  CT PE study is pending.  CT completed shows no evidence of pericardial effusion or pulmonary embolus.  Troponins negative.  Recommend close outpatient follow-up regarding symptoms and discussed reasons to return.    Gareth Morgan, MD 10/28/19 1501

## 2019-10-25 NOTE — Progress Notes (Signed)
Lower extremity venous LT study completed.  Preliminary results relayed to providers in ED.   See CV Proc for preliminary results report.   Darlin Coco, RDMS

## 2019-10-27 ENCOUNTER — Telehealth: Payer: Self-pay | Admitting: Hematology

## 2019-10-27 NOTE — Telephone Encounter (Signed)
No 10/13 los

## 2019-11-01 NOTE — Progress Notes (Signed)
[facility]   [phone] [phone]   Clinic Follow up Note   Patient Care Team: [name], [name], [name] as PCP - General (Physician Assistant) Debra Killings, MD as Consulting Physician (Orthopedic Surgery)  Date of Service:  11/03/2019  CHIEF COMPLAINT: f/u of metastatic breast cancer  SUMMARY OF ONCOLOGIC HISTORY: Oncology History Overview Note   Cancer Staging No matching staging information was found for the patient.     [name], right ([place])  11/1997 Cancer Diagnosis   Patient had 1.4 cm poorly differentiated right breast carcinoma diagnosed in Nov 1999 at age 76, 1/7 axillary nodes involved, ER/PR and HER 2 positive. She had mastectomy with the 7 axillary node evaluation, 4 cycles of adriamycin/ cytoxan followed by taxotere, then five years of tamoxifen thru June 2005 (no herceptin in 1999). She had bilateral oophorectomy in May 2005. She was briefly on aromatase inhibitor after tamoxifen, but discontinued this herself due to poor tolerance.   04/04/2015 Genetic Testing   Negative genetic testing on the breast/ovarian cancer pnael.  The Breast/Ovarian gene panel offered by [facility] includes sequencing and rearrangement analysis for the following 20 genes:  ATM, BARD1, BRCA1, BRCA2, BRIP1, CDH1, CHEK2, EPCAM, FANCC, MLH1, MSH2, MSH6, NBN, PALB2, PMS2, PTEN, RAD51C, RAD51D, TP53, and XRCC2.   06/26/2015 Pathology Results   Initial Path Report Bone, curettage, Left femur met, intramedullary subtroch tissue - METASTATIC ADENOCARCINOMA.  Estrogen Receptor: 95%, POSITIVE, STRONG STAINING INTENSITY Progesterone Receptor: 2%, POSITIVE, STRONG  HER2 (+), IHC 3+   06/26/2015 Surgery   Biopsy of metastatic tissue and stabilization with Affixus trochanteric nail, proximal and distal interlock of left femur by Dr. [name]    06/28/2015 Imaging   CT Chest w/ Contrast 1. Extensive right internal mammary chain  lymphadenopathy consistent with metastatic breast cancer. 2. Lytic metastases involving the posterior elements at T1 with probable nondisplaced pathologic fracture of the spinous process. 3. No other evidence of thoracic metastatic disease. 4. Trace bilateral pleural effusions with associated bibasilar atelectasis. 5. Indeterminate right thyroid nodule, not previously imaged. This could be evaluated with thyroid ultrasound as clinically warranted.   06/29/2015 -  Chemotherapy   Zometa started monthly on 06/29/15 and switched to q87months from 01/08/17   07/01/2015 Imaging   Bone Scan 1. Increased activity noted throughout the left femur. Although metastatic disease cannot be excluded, these changes are most likely secondary to prior surgery. 2. No other focal abnormalities identified to suggest metastatic disease.   07/12/2015 - 07/26/2015 Radiation Therapy   Left femur, 30 Gy in 10 fractions by Dr. Sondra Come    07/14/2015 Initial Diagnosis   [name], right ([place])   07/19/2015 Imaging   Initial PET Scan 1. Severe multifocal osseous metastatic disease, much greater than anticipated based on prior imaging. 2. Multifocal nodal metastases involving the right internal mammary, prevascular and subpectoral lymph nodes. No axillary pulmonary involvement identified. 3. No distant extra osseous metastases.   07/30/2015 - 11/29/2015 Chemotherapy   Docetaxel, Herceptin and Perjeta every 3 weeks X6 cycles, pt tolerated moderately well, restaging scan showed excellent response    09/18/2015 Imaging   CT Head w/wo Contrast 1. No acute intracranial abnormality or significant interval change. 2. Stable minimal periventricular white matter hypoattenuation on the right. This may reflect a remote ischemic injury. 3. No evidence for metastatic disease to the brain. 4. No focal soft tissue lesion to explain the patient's tenderness.   10/30/2015 Imaging  CT Abdomen Pelvis w/  Contrast 1. Few mildly prominent fluid-filled loops of small bowel scattered throughout the abdomen, with intraluminal fluid density within the distal colon. Given the provided history, findings are suggestive of acute enteritis/diarrheal illness. 2. No other acute intra-abdominal or pelvic process identified. 3. Widespread osseous metastatic disease, better evaluated on most recent PET-CT from 07/19/2015. No pathologic fracture or other complication. 4. 11 mm hypodensity within the left kidney. This lesion measures intermediate density, and is indeterminate. While this lesion is similar in size relative to recent studies, this is increased in size relative to prior study from 2012. Further evaluation with dedicated renal mass protocol CT and/or MRI is recommended for complete characterization.   12/2015 - 05/14/2016 Chemotherapy   Maintenance Herceptin and pejeta every 3 weeks stopped on 05/14/16 due to disease progression   12/25/2015 Imaging   Restaging PET Scan 1. Markedly improved skeletal activity, with only several faint foci of residual accentuated metabolic activity, but resolution of the vast majority of the previously extensive osseous metastatic disease. 2. Resolution of the prior right internal mammary and right prevascular lymph nodes. 3. Residual subcutaneous edema and edema along fascia planes in the left upper thigh. This remains somewhat more than I would expect for placement of an IM nail 6 weeks ago. If there is leg swelling further down, consider Doppler venous ultrasound to rule out left lower extremity DVT. I do not perceive an obvious difference in density between the pelvic and common femoral veins on the noncontrast CT data. 4. Chronic right maxillary and right sphenoid sinusitis.   01/16/2016 - 10/20/2017 Anti-estrogen oral therapy   Letrozole 2.5 mg daily. She was switched to exemestane due to disease progression on 02/08/17. Stopped on 10/20/2017 when treatment changed to chemo     03/01/2016 Miscellaneous   Patient presented to ED following a fall; she reports she landed on her back and is now experiencing back pain. The patient was evaluated and discharge home same day with pain control.   05/01/2016 Imaging   CT CAP IMPRESSION: 1. Stable exam.  No new or progressive disease identified. 2. No evidence for residual or recurrent adenopathy within the chest. 3. Stable bone metastasis.   05/01/2016 Imaging   BONE SCAN IMPRESSION: 1. Small focal uptake involving the anterior rib ends of the left second and right fourth ribs, new since the prior bone scan. The location of this uptake is more suggestive of a traumatic or inflammatory etiology as opposed to metastatic disease. No other evidence of metastatic disease. 2. There are other areas of stable uptake which are most likely degenerative/reactive, including right shoulder and cervical spine uptake and uptake along the right femur adjacent to the ORIF hardware.   05/20/2016 Imaging   NM PET Skull to Thigh IMPRESSION: 1. There multiple metastatic foci in the bony pelvis, including some new abnormal foci compare to the prior PET-CT, compatible with mildly progressive bony metastatic disease. Also, a left T11 vertebral hypermetabolic metastatic lesion is observed, new compared to the prior exam. 2. No active extraosseous malignancy is currently identified. 3. Right anterior fourth rib healing fracture, appears be   06/04/2016 - 01/08/2017 Chemotherapy   Second line chemotherapy Kadcyla every 3 weeks started on 06/04/16 and stopped on 01/08/17 due to mixed response.     10/05/2016 Imaging   CT CAP and Bone Scan:  Bones: left intramedullary rod and left femoral head neck screw in place. In the proximal diaphysis of the left femur there is cortical thickening and   irregularity. No lytic or blastic bone lesions. No fracture or vertebral endplate destruction.   No definite evidence of recurrent disease in the  chest, abdomen, or pelvis.   10/28/2016 Imaging   CT A/P IMPRESSION: No acute process demonstrated in the abdomen or pelvis. No evidence of bowel obstruction or inflammation.   11/04/2016 Imaging   DG foot complete left: IMPRESSION: No fracture or dislocation.  No soft tissue abnormality   11/23/2016 Imaging   CT RIGHT FEMUR IMPRESSION: Negative CT scan of the right thigh.  No visible metastatic disease.  CT LEFT FEMUR IMPRESSION: 1. Postsurgical changes related to prior cephalomedullary rod fixation of the left femur with linear lucency along the anterolateral proximal femoral cortex at level of the lesser trochanter, suspicious for nondisplaced fracture. 2. Slightly permeative appearance of the proximal left femoral cortex at the site of known prior osseous metastases is largely unchanged. 3. Unchanged patchy lucency and sclerosis along the anterior acetabulum, which appears to correspond with an area of faint uptake on the prior PET-CT, suspicious for osseous metastasis. These results will be called to the ordering clinician or representative by the Radiologist Assistant, and communication documented in the PACS or zVision Dashboard.     01/27/2017 Imaging   IMPRESSION: 1. Mixed response to osseous metastasis. Some hypermetabolic lesions are new and increasingly hypermetabolic, while another index lesion is decreasingly hypermetabolic. 2. No evidence of hypermetabolic soft tissue metastasis.    02/08/2017 - 10/19/2017 Antibody Plan   -Herceptin every 3 week since 02/08/17 -Oral Lanpantinib 1562m and decreased to 5045mdue to diarrhea and depression. Due to disease progression she swithced to Verzenio 15035mn 05/04/17. She did not toelrate well so she was switched to Ibrance 100m50m 05/31/17. Due to her neutropenia, Ibrance dose reduced to 75 mg daily, 3 weeks on, one-week off, stopped on 10/20/2017 due to disease progression.    04/30/2017 PET scan   IMPRESSION: 1.  Primarily increase in hypermetabolism of osseous metastasis. An isolated right acetabular lesion is decreased in hypermetabolism since the prior exam. 2. No evidence of hypermetabolic soft tissue metastasis. 3. Similar non FDG avid right-sided thyroid nodule, favoring a benign etiology. Recommend attention on follow-up.   08/30/2017 PET scan   08/30/2017 PET Scan  IMPRESSION: 1. Worsening osseous metastatic disease. 2. Focal hypermetabolism in the central spinal canal the L1 level, similar to the prior exam. Metastatic implant cannot be excluded. 3. Slight enlargement of a right thyroid nodule which now appears more cystic in character. Consider further evaluation with thyroid ultrasound. If patient is clinically hyperthyroid, consider nuclear medicine thyroid uptake and scan   08/30/2017 Progression   08/30/2017 PET Scan  IMPRESSION: 1. Worsening osseous metastatic disease. 2. Focal hypermetabolism in the central spinal canal the L1 level, similar to the prior exam. Metastatic implant cannot be excluded. 3. Slight enlargement of a right thyroid nodule which now appears more cystic in character. Consider further evaluation with thyroid ultrasound. If patient is clinically hyperthyroid, consider nuclear medicine thyroid uptake and scan   09/29/2017 - 10/11/2017 Radiation Therapy   Pt received 27 Gy in 9 fractions directed at the areas of significant uptake noted on recent bone scan the right pelvis and right proximal femur, with Dr, KinaSondra Come10/09/2017 - 04/07/2018 Chemotherapy   -Herceptin every 3 weeks starting 02/08/17, perjeta added on 10/20/2017  -weekly Taxol started on 10/21/2017. Will reduce Taxol to 2 weeks on, 1 week off starting on 02/04/17.  Stoped due to mild disease progression  10/27/2017 Imaging   10/27/2017 CT AP IMPRESSION: 1. No acute abnormality. No evidence of bowel obstruction or acute bowel inflammation. Normal appendix. 2. Stable patchy sclerotic osseous  metastases throughout the visualized skeleton. No new or progressive metastatic disease in the abdomen or pelvis.   01/11/2018 Imaging   Whole Body Bone Scan 01/11/18  IMPRESSION: 1. Multifocal osseous metastases as described. 2. Right scapular metastasis. 3. Right superior thoracic spinous process. 4. Right sacral ala and right L5 metastases. 5. Right acetabular metastasis. 6. Right proximal femur metastasis. 7. Diffuse metastases throughout the left femur. 8. Anterior right fourth rib metastasis.   01/11/2018 Imaging   CT CAP W Contrast 01/11/18  IMPRESSION: 1. Similar-appearing osseous metastatic disease. 2. No evidence for progressed metastatic disease in the chest, abdomen or pelvis.   03/30/2018 Imaging   Bone Scan 03/30/18  IMPRESSION: Stable multifocal bony metastatic disease.   03/30/2018 Imaging    CT CAP W Contrast 03/30/18 IMPRESSION: 1. New mild contiguous wall thickening with associated mucosal hyperenhancement throughout the left colon and rectum, suggesting nonspecific infectious or inflammatory proctocolitis. Differential includes C diff colitis. 2. Stable faint patchy sclerosis in the iliac bones and lumbar vertebral bodies. No new or progressive osseous metastatic disease by CT. Please note that the osseous lesions are relatively occult by CT and would be better evaluated by PET-CT, as clinically warranted. 3. No evidence of extra osseous metastatic disease.  These results will be called to the ordering clinician or representative by the Radiologist Assistant, and communication documented in the PACS or zVision Dashboard.   04/15/2018 - 06/16/2018 Chemotherapy   Enhertu q3weeks starting 04/15/18. Stopped Enhertu after cycle 3 on 05/27/18 due to poor toleration due to poor toleration with anorexia, weight loss, N&V, diarrhea   04/21/2018 PET scan   PET 04/21/18  IMPRESSION: 1. Overall marked interval improvement in bony hypermetabolic disease. All of the  previously identified hypermetabolic osseous metastases have decreased in hypermetabolism in the interval. There is a new tiny focus of hypermetabolism in the right aspect of the L3 vertebral body that appears to correspond to a new 8 mm lucency, raising concern for new metastatic disease. Close attention on follow-up recommended. 2. Focal hypermetabolism in the central spinal canal at the level of L1 previously has resolved. 3. No hypermetabolic soft tissue disease on today's study. 4. Right thyroid nodule seen previously has decreased in the interval.    06/16/2018 Imaging   CT LUMBAR SPINE WO CONTRAST 06/16/18 IMPRESSION: 1. Transitional lumbosacral anatomy. Correlation with radiographs is recommended prior to any operative intervention. 2. Suspicion of a right L3 vertebral body metastasis on the April PET-CT is not evident by CT. Stable degenerative versus metastatic lesion at the right L5 superior endplate. Small but increased right iliac bone metastasis since March. Lumbar MRI would best characterize lumbar metastatic disease (without contrast if necessary). 3. Suspect L2-L3 left subarticular segment disc herniation. Query left L2 radiculitis. 4. Stable appearance of rightward L4-L5 disc degeneration which could affect the right foramen and right lateral recess.   06/17/2018 - 11/16/2018 Chemotherapy   Restart Taxol 2 weeks on/1 week off with Herceptin and Perjeta q3weeks on 06/17/18. Perjeta was stopped after C5 due to diarrhea. Stopped Taxol after 11/16/18 due to disease progression to L3.    07/06/2018 PET scan   PET  IMPRESSION: 1. Near total resolution of hypermetabolic activity associated with skeletal metastasis. Very mild activity associated with the LEFT iliac bone which is similar to background blood pool activity. 2. No new   or progressive disease.   11/21/2018 PET scan   IMPRESSION: 1. New focal hypermetabolic lesion eccentric to the right in the L3 vertebral body,  maximum SUV 6.1 compatible with a new focus of active malignancy. 2. Very low-grade activity similar to blood pool in previous existing mildly sclerotic metastatic lesions including the left iliac bone lesion. Some of the mildly sclerotic lesions are not hypermetabolic compatible with effectively treated bony metastatic disease. 3. No hypermetabolic soft tissue lesions are identified.     11/30/2018 - 08/25/2019 Chemotherapy   Continue Herceptin q3weeks. Stopped 08/25/19 due to significant disease progression and switched to  IV Matuzumab   12/12/2018 - 12/17/2018 Chemotherapy   Tucatinib (TUKYSA) 6 tabs BID as a next line therapy starting 12/12/18. Stopped 12/17/18 duet o N&V, poor appetite and HA    12/21/2018 - 03/17/2019 Chemotherapy   IV Eribulin on day 1, 8 every 21 days starting 12/21/18. Stopped 03/17/19 due to mixed response to treatment.    03/21/2019 PET scan   IMPRESSION: 1. Mixed response to therapy of osseous metastasis. Although a right-sided L4 lesion demonstrates decreased hypermetabolism today, new left iliac and right ischial hypermetabolic lesions are seen. 2. No evidence of hypermetabolic soft tissue metastasis.     03/27/2019 - 07/21/2019 Chemotherapy   Neratinib (Nerlynx) titrate up from $Remov'120mg'hEoMOl$  to $R'240mg'kp$  starting 03/27/19. She could not tolerate $RemoveBefo'160mg'wqfbakaQSAx$  and stopped on 02/05/19 due to N&V and diarrhea. Will restart at $RemoveBe'120mg'InxDKuxhT$  on 04/10/18-04/27/19 and 4/19-4/22. Reduced to $RemoveBe'80mg'VxzcbHtJX$  once daily on 05/10/19. Held/stopped since 07/21/19 due to dry cough, Nausea and body aches.   06/23/2019 Imaging   CT Head  IMPRESSION: 1. No acute intracranial abnormality or metastatic disease identified. 2. Chronic white matter disease in the right hemisphere is stable from prior exams.     09/05/2019 PET scan   IMPRESSION: 1. Significant progression of widespread hypermetabolic osseous metastases throughout the axial skeleton as detailed. 2. No hypermetabolic extraosseous sites of metastatic disease.    09/15/2019 -  Chemotherapy   IV Matuzumab q3weeks and IV Gemcitabine 2 weeks on/1 week off starting 09/15/19      CURRENT THERAPY:  -Zometa q42months -IV Margetuximabq3weeks and IV Gemcitabine2 weeks on/1 week offstarting 09/15/19  INTERVAL HISTORY:  DARIELLA GILLIHAN is here for a follow up. She presents to the clinic with her interpretor.  I saw her in my office on October 13 for worsening chest pain, and she was sent to [facility] emergency room for further work-up.  CT scan was negative for PE, she was treated with pain medication, and discharged home.  Her chest pain has been manageable since then, she takes oxycodone 1 to 2 tablets a day.  No other new complaints.  She has mild to moderate fatigue, appetite slightly low, no diarrhea, able to function at home.  No fever or chills, weight is stable.  All other systems were reviewed with the patient and are negative.  MEDICAL HISTORY:  Past Medical History:  Diagnosis Date  . Abnormal Pap smear   . Anxiety   . Breast cancer ([place])   . Cancer ([place]) 1999   breast-s/p mastectomy, chemo, rad  . CIN I (cervical intraepithelial neoplasia I) 2003   by colpo  . Congenital deafness   . Deaf   . Depression   . Port-A-Cath in place 2017   for chemotherapy  . Radiation 07/12/15-07/26/15   left femur 30 Gy  . S/P bilateral oophorectomy     SURGICAL HISTORY: Past Surgical History:  Procedure Laterality Date  .  ABDOMINAL HYSTERECTOMY    . INTRAMEDULLARY (IM) NAIL INTERTROCHANTERIC Left 06/26/2015   Procedure: LEFT BIOMET LONG AFFIXS NAIL;  Surgeon: Debra Killings, MD;  Location: [facility];  Service: Orthopedics;  Laterality: Left;  . IR GENERIC HISTORICAL  08/06/2015   IR US GUIDE VASC ACCESS LEFT 08/06/2015 Aletta Edouard, MD [facility]  . IR GENERIC HISTORICAL  08/06/2015   IR FLUORO GUIDE CV LINE LEFT 08/06/2015 Aletta Edouard, MD [facility]  . IR GENERIC HISTORICAL  03/05/2016   IR CV LINE INJECTION 03/05/2016 Markus Daft, MD [facility]  .  LEFT HEART CATH AND CORONARY ANGIOGRAPHY N/A 12/13/2017   Procedure: LEFT HEART CATH AND CORONARY ANGIOGRAPHY;  Surgeon: Leonie Man, MD;  Location: [facility];  Service: Cardiovascular;  Laterality: N/A;  . MASTECTOMY     right breast  . MASTECTOMY    . OVARIAN CYST REMOVAL    . RADIOLOGY WITH ANESTHESIA Left 06/25/2015   Procedure: MRI OF LEFT HIP WITH OR WITHOUT CONTRAST;  Surgeon: Medication Radiologist, MD;  Location: [facility];  Service: Radiology;  Laterality: Left;  DR. MCINTYRE/MRI  . TUBAL LIGATION      I have reviewed the social history and family history with the patient and they are unchanged from previous note.  ALLERGIES:  is allergic to promethazine hcl, chlorhexidine, and diphenhydramine hcl.  MEDICATIONS:  Current Outpatient Medications  Medication Sig Dispense Refill  . ALPRAZolam (XANAX) 0.25 MG tablet Take 1 tablet (0.25 mg total) by mouth 2 (two) times daily as needed for anxiety. 30 tablet 0  . diphenoxylate-atropine (LOMOTIL) 2.5-0.025 MG tablet Take 1-2 tablets by mouth 4 (four) times daily as needed for diarrhea or loose stools. 60 tablet 1  . guaiFENesin-codeine 100-10 MG/5ML syrup Take 5 mLs by mouth every 4 (four) hours as needed for cough. 180 mL 0  . lidocaine-prilocaine (EMLA) cream Apply 1 application topically as needed. Apply to Porta-Cath 1-2 hours prior to access as directed. 90 g 2  . ondansetron (ZOFRAN) 8 MG tablet Take 1 tablet (8 mg total) by mouth every 8 (eight) hours as needed for nausea or vomiting. 45 tablet 1  . oxyCODONE (ROXICODONE) 5 MG immediate release tablet Take 1 tablet (5 mg total) by mouth every 8 (eight) hours as needed for severe pain. 60 tablet 0  . potassium chloride (KLOR-CON) 10 MEQ tablet Take 3 tablets (30 mEq total) by mouth daily. 90 tablet 2  . prochlorperazine (COMPAZINE) 10 MG tablet Take 1 tablet (10 mg total) by mouth 2 (two) times daily as needed for nausea or vomiting. 10 tablet 0  . zolpidem (AMBIEN) 10 MG  tablet Take 1 tablet (10 mg total) by mouth at bedtime as needed for sleep. 30 tablet 0   No current facility-administered medications for this visit.   Facility-Administered Medications Ordered in Other Visits  Medication Dose Route Frequency Provider Last Rate Last Admin  . potassium chloride SA (K-DUR) CR tablet 40 mEq  40 mEq Oral Once Truitt Merle, MD      . sodium chloride 0.9 % 1,000 mL with potassium chloride 10 mEq infusion   Intravenous Continuous Gordy Levan, MD   Stopped at 09/10/15 1653  . sodium chloride 0.9 % injection 10 mL  10 mL Intravenous PRN Livesay, Lennis P, MD        PHYSICAL EXAMINATION: ECOG PERFORMANCE STATUS: 1 - Symptomatic but completely ambulatory  Vitals:   11/03/19 0833  BP: 119/67  Pulse: 63  Resp: 18  Temp: (!) 97.2 F (36.2 C)  SpO2: 100%   Filed Weights   11/03/19 0833  Weight: 133 lb 3.2 oz (60.4 kg)    GENERAL:alert, no distress and comfortable SKIN: skin color, texture, turgor are normal, no rashes or significant lesions EYES: normal, Conjunctiva are pink and non-injected, sclera clear NECK: supple, thyroid normal size, non-tender, without nodularity LYMPH:  no palpable lymphadenopathy in the cervical, axillary  LUNGS: clear to auscultation and percussion with normal breathing effort HEART: regular rate & rhythm and no murmurs and no lower extremity edema ABDOMEN:abdomen soft, non-tender and normal bowel sounds Musculoskeletal:no cyanosis of digits and no clubbing  NEURO: alert & oriented x 3 with fluent speech, no focal motor/sensory deficits  LABORATORY DATA:  I have reviewed the data as listed CBC Latest Ref Rng & Units 11/03/2019 10/25/2019 10/25/2019  WBC 4.0 - 10.5 K/uL 5.5 19.0(H) 22.1(H)  Hemoglobin 12.0 - 15.0 g/dL 9.5(L) 8.4(L) 9.1(L)  Hematocrit 36 - 46 % 28.3(L) 25.7(L) 26.8(L)  Platelets 150 - 400 K/uL 256 73(L) 77(L)     CMP Latest Ref Rng & Units 10/25/2019 10/25/2019 10/20/2019  Glucose 70 - 99 mg/dL 85 89 72   BUN 6 - 20 mg/dL $Remove'10 11 12  'upPOPqS$ Creatinine 0.44 - 1.00 mg/dL 0.78 0.85 0.85  Sodium 135 - 145 mmol/L 139 139 139  Potassium 3.5 - 5.1 mmol/L 3.3(L) 3.4(L) 3.4(L)  Chloride 98 - 111 mmol/L 109 106 106  CO2 22 - 32 mmol/L $RemoveB'23 29 26  'jPeLhcFe$ Calcium 8.9 - 10.3 mg/dL 7.8(L) 9.1 8.8(L)  Total Protein 6.5 - 8.1 g/dL - 6.4(L) 6.4(L)  Total Bilirubin 0.3 - 1.2 mg/dL - 1.6(H) 1.8(H)  Alkaline Phos 38 - 126 U/L - 152(H) 69  AST 15 - 41 U/L - 17 15  ALT 0 - 44 U/L - 19 23      RADIOGRAPHIC STUDIES: I have personally reviewed the radiological images as listed and agreed with the findings in the report. No results found.   ASSESSMENT & PLAN:  LYNNET HEFLEY is a 54 y.o. 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 She had right mastectomy, 4 cycles of AC-T and 5 years of Tamoxifen/AI.  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 -After her first line ofchemotreatment and initial maintenance therapy, she progressed or had poor toleration of multiple lines of treatment since then. Latest in 08/2019. -Ipreviouslydiscussed after multiple treatments tried, her remaining options are fewer. I recommendednext-line (7th line)with anti-HER2IV Margetuximabq3weekswithIV Chemo weeklyGemcitabine2 weeks on/1 week off.She started on9/3/21. Plan-S/p C2 she has tolerated well with some fatigue and low appetite -She has developed worsening chest pain a few weeks ago, and her tumor marker has been increasing trending up, which is concerning for cancer progression. -I recommend adding carboplatinum AUC 1 to weekly gemcitabine, potential benefit and side effects discussed with patient, chemo consent obtained today, will start today. -Follow-up next week   2.Body pain, secondary to bone mets, Hypocalcemia, leg cramps  -She is being treated with Zometa q78months.  -She  will continue oral calcium and Vit Dand Prilosec for Acid Reflux. -Current pain includes, lower back, upper legs, hips and rightshoulder.PET from8/24/21does indicate disease progression in her spine, pelvis and right scapula. -Pain is currently controlled on $RemoveBefor'5mg'JoikUfyiNiOc$  Oxycodone1-2 tabs daily.  -She had a severe episode of chest pain, work-up was negative, probably related to her bone metastasis  3.Congenital  deafness  4.Mild anemia, secondary to chemotherapy and malignancy -Stable   5.Hypokalemia, Hypomagnesium, secondary toDiarrhea -She developed diarrhea s/p cycle 3 Perjetaand again with Nerlynx, Both have been D/c. -She is onOral potassium 33meq3 times a day.She stopped oral Mag because it made her nauseous.  -She can take more potassium when she has diarrhea.  6.Goal of care discussion, DNR/DNI -Wepreviouslyreviewed goal of care andI recommend DNR/DNI, sheagreed.  -I encouraged her to start setting up her living will. We previously discussed hospice care if she has no more treatment available or could not tolerate more treatment  7.Insomnia -She cannot take Benadryl due to allergy reaction. She has tried trazodone with no relief.  -She is on Xanax and Ambien $RemoveBe'5mg'AOTHmIGtE$  tabs nightly. I prescribe at $RemoveBefo'10mg'CMcAjLsQxdp$  Ambien (take half tablet) given insurance. She knows not to take both at the same time.   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 Stable  9. Mildhyperbilirubinemia  -likely second to chemotherapy, fluctuates  -Will monitor closely. Stable.    PLAN: -Lab reviewed, adequate for treatment, will proceed with gemcitabine and Margetuximabtoday, adding carboplatin AUC 1 today and next week -f/u and chemo next week   No problem-specific Assessment & Plan notes found for this encounter.   No orders of the defined types were placed in this encounter.  All questions were answered. The patient knows to call the clinic with any problems, questions or concerns. No  barriers to learning was detected. The total time spent in the appointment was 40 minutes.     Truitt Merle, MD 11/03/2019   I, Joslyn Devon, am acting as scribe for Truitt Merle, MD.   I have reviewed the above documentation for accuracy and completeness, and I agree with the above.

## 2019-11-02 ENCOUNTER — Encounter: Payer: Self-pay | Admitting: Hematology

## 2019-11-03 ENCOUNTER — Inpatient Hospital Stay: Payer: Medicaid Other

## 2019-11-03 ENCOUNTER — Other Ambulatory Visit: Payer: Self-pay

## 2019-11-03 ENCOUNTER — Encounter: Payer: Self-pay | Admitting: Hematology

## 2019-11-03 ENCOUNTER — Inpatient Hospital Stay (HOSPITAL_BASED_OUTPATIENT_CLINIC_OR_DEPARTMENT_OTHER): Payer: Medicaid Other | Admitting: Hematology

## 2019-11-03 VITALS — BP 119/67 | HR 63 | Temp 97.2°F | Resp 18 | Ht 65.0 in | Wt 133.2 lb

## 2019-11-03 DIAGNOSIS — C50919 Malignant neoplasm of unspecified site of unspecified female breast: Secondary | ICD-10-CM

## 2019-11-03 DIAGNOSIS — Z95828 Presence of other vascular implants and grafts: Secondary | ICD-10-CM

## 2019-11-03 DIAGNOSIS — C7951 Secondary malignant neoplasm of bone: Secondary | ICD-10-CM | POA: Diagnosis not present

## 2019-11-03 DIAGNOSIS — C50911 Malignant neoplasm of unspecified site of right female breast: Secondary | ICD-10-CM

## 2019-11-03 DIAGNOSIS — Z5112 Encounter for antineoplastic immunotherapy: Secondary | ICD-10-CM | POA: Diagnosis not present

## 2019-11-03 DIAGNOSIS — G4700 Insomnia, unspecified: Secondary | ICD-10-CM | POA: Diagnosis not present

## 2019-11-03 LAB — CBC WITH DIFFERENTIAL (CANCER CENTER ONLY)
Abs Immature Granulocytes: 0.06 10*3/uL (ref 0.00–0.07)
Basophils Absolute: 0 10*3/uL (ref 0.0–0.1)
Basophils Relative: 0 %
Eosinophils Absolute: 0.1 10*3/uL (ref 0.0–0.5)
Eosinophils Relative: 2 %
HCT: 28.3 % — ABNORMAL LOW (ref 36.0–46.0)
Hemoglobin: 9.5 g/dL — ABNORMAL LOW (ref 12.0–15.0)
Immature Granulocytes: 1 %
Lymphocytes Relative: 18 %
Lymphs Abs: 1 10*3/uL (ref 0.7–4.0)
MCH: 30.9 pg (ref 26.0–34.0)
MCHC: 33.6 g/dL (ref 30.0–36.0)
MCV: 92.2 fL (ref 80.0–100.0)
Monocytes Absolute: 0.4 10*3/uL (ref 0.1–1.0)
Monocytes Relative: 8 %
Neutro Abs: 3.9 10*3/uL (ref 1.7–7.7)
Neutrophils Relative %: 71 %
Platelet Count: 256 10*3/uL (ref 150–400)
RBC: 3.07 MIL/uL — ABNORMAL LOW (ref 3.87–5.11)
RDW: 17.7 % — ABNORMAL HIGH (ref 11.5–15.5)
WBC Count: 5.5 10*3/uL (ref 4.0–10.5)
nRBC: 0 % (ref 0.0–0.2)

## 2019-11-03 LAB — CMP (CANCER CENTER ONLY)
ALT: 14 U/L (ref 0–44)
AST: 16 U/L (ref 15–41)
Albumin: 3.7 g/dL (ref 3.5–5.0)
Alkaline Phosphatase: 102 U/L (ref 38–126)
Anion gap: 8 (ref 5–15)
BUN: 9 mg/dL (ref 6–20)
CO2: 25 mmol/L (ref 22–32)
Calcium: 9 mg/dL (ref 8.9–10.3)
Chloride: 109 mmol/L (ref 98–111)
Creatinine: 0.82 mg/dL (ref 0.44–1.00)
GFR, Estimated: >60 mL/min (ref >60)
Glucose, Bld: 87 mg/dL (ref 70–99)
Potassium: 3.3 mmol/L — ABNORMAL LOW (ref 3.5–5.1)
Sodium: 142 mmol/L (ref 135–145)
Total Bilirubin: 1.3 mg/dL — ABNORMAL HIGH (ref 0.3–1.2)
Total Protein: 6.1 g/dL — ABNORMAL LOW (ref 6.5–8.1)

## 2019-11-03 MED ORDER — SODIUM CHLORIDE 0.9 % IV SOLN
10.0000 mg | Freq: Once | INTRAVENOUS | Status: AC
  Administered 2019-11-03: 10 mg via INTRAVENOUS
  Filled 2019-11-03: qty 10

## 2019-11-03 MED ORDER — ACETAMINOPHEN 325 MG PO TABS
650.0000 mg | ORAL_TABLET | Freq: Once | ORAL | Status: AC
  Administered 2019-11-03: 650 mg via ORAL

## 2019-11-03 MED ORDER — ZOLPIDEM TARTRATE 10 MG PO TABS: 10.0000 mg | ORAL_TABLET | Freq: Every evening | ORAL | 2 refills | Status: DC | PRN

## 2019-11-03 MED ORDER — SODIUM CHLORIDE 0.9 % IV SOLN
Freq: Once | INTRAVENOUS | Status: AC
  Filled 2019-11-03: qty 250

## 2019-11-03 MED ORDER — PALONOSETRON HCL INJECTION 0.25 MG/5ML
0.2500 mg | Freq: Once | INTRAVENOUS | Status: AC
  Administered 2019-11-03: 0.25 mg via INTRAVENOUS

## 2019-11-03 MED ORDER — LORATADINE 10 MG PO TABS
10.0000 mg | ORAL_TABLET | Freq: Once | ORAL | Status: AC
  Administered 2019-11-03: 10 mg via ORAL

## 2019-11-03 MED ORDER — SODIUM CHLORIDE 0.9% FLUSH
10.0000 mL | INTRAVENOUS | Status: DC | PRN
  Administered 2019-11-03: 10 mL
  Filled 2019-11-03: qty 10

## 2019-11-03 MED ORDER — LORATADINE 10 MG PO TABS
ORAL_TABLET | ORAL | Status: AC
  Filled 2019-11-03: qty 1

## 2019-11-03 MED ORDER — HEPARIN SOD (PORK) LOCK FLUSH 100 UNIT/ML IV SOLN
500.0000 [IU] | Freq: Once | INTRAVENOUS | Status: AC | PRN
  Administered 2019-11-03: 500 [IU]
  Filled 2019-11-03: qty 5

## 2019-11-03 MED ORDER — PALONOSETRON HCL INJECTION 0.25 MG/5ML
INTRAVENOUS | Status: AC
  Filled 2019-11-03: qty 5

## 2019-11-03 MED ORDER — SODIUM CHLORIDE 0.9 % IV SOLN
15.0000 mg/kg | Freq: Once | INTRAVENOUS | Status: AC
  Administered 2019-11-03: 875 mg via INTRAVENOUS
  Filled 2019-11-03: qty 35

## 2019-11-03 MED ORDER — ACETAMINOPHEN 325 MG PO TABS
ORAL_TABLET | ORAL | Status: AC
  Filled 2019-11-03: qty 2

## 2019-11-03 MED ORDER — SODIUM CHLORIDE 0.9 % IV SOLN
100.0000 mg | Freq: Once | INTRAVENOUS | Status: AC
  Administered 2019-11-03: 100 mg via INTRAVENOUS
  Filled 2019-11-03: qty 10

## 2019-11-03 MED ORDER — SODIUM CHLORIDE 0.9 % IV SOLN
800.0000 mg/m2 | Freq: Once | INTRAVENOUS | Status: AC
  Administered 2019-11-03: 1330 mg via INTRAVENOUS
  Filled 2019-11-03: qty 34.98

## 2019-11-03 MED ORDER — SODIUM CHLORIDE 0.9% FLUSH
10.0000 mL | Freq: Once | INTRAVENOUS | Status: AC
  Administered 2019-11-03: 10 mL
  Filled 2019-11-03: qty 10

## 2019-11-03 NOTE — Patient Instructions (Addendum)
[facility] Discharge Instructions for Patients Receiving Chemotherapy  Today you received the following chemotherapy agents: Gemzar, Carboplatin, Margetuximab  To help prevent nausea and vomiting after your treatment, we encourage you to take your nausea medication as directed.    If you develop nausea and vomiting that is not controlled by your nausea medication, call the clinic.   BELOW ARE SYMPTOMS THAT SHOULD BE REPORTED IMMEDIATELY:  *FEVER GREATER THAN 100.5 F  *CHILLS WITH OR WITHOUT FEVER  NAUSEA AND VOMITING THAT IS NOT CONTROLLED WITH YOUR NAUSEA MEDICATION  *UNUSUAL SHORTNESS OF BREATH  *UNUSUAL BRUISING OR BLEEDING  TENDERNESS IN MOUTH AND THROAT WITH OR WITHOUT PRESENCE OF ULCERS  *URINARY PROBLEMS  *BOWEL PROBLEMS  UNUSUAL RASH Items with * indicate a potential emergency and should be followed up as soon as possible.  Feel free to call the clinic should you have any questions or concerns. The clinic phone number is [phone].  Please show the [facility] at check-in to the Emergency Department and triage nurse.    Carboplatin injection What is this medicine? CARBOPLATIN (KAR boe pla tin) is a chemotherapy drug. It targets fast dividing cells, like cancer cells, and causes these cells to die. This medicine is used to treat ovarian cancer and many other cancers. This medicine may be used for other purposes; ask your health care provider or pharmacist if you have questions. COMMON BRAND NAME(S): Paraplatin What should I tell my health care provider before I take this medicine? They need to know if you have any of these conditions:  blood disorders  hearing problems  kidney disease  recent or ongoing radiation therapy  an unusual or allergic reaction to carboplatin, cisplatin, other chemotherapy, other medicines, foods, dyes, or preservatives  pregnant or trying to get pregnant  breast-feeding How should I use this  medicine? This drug is usually given as an infusion into a vein. It is administered in a hospital or clinic by a specially trained health care professional. Talk to your pediatrician regarding the use of this medicine in children. Special care may be needed. Overdosage: If you think you have taken too much of this medicine contact a poison control center or emergency room at once. NOTE: This medicine is only for you. Do not share this medicine with others. What if I miss a dose? It is important not to miss a dose. Call your doctor or health care professional if you are unable to keep an appointment. What may interact with this medicine?  medicines for seizures  medicines to increase blood counts like filgrastim, pegfilgrastim, sargramostim  some antibiotics like amikacin, gentamicin, neomycin, streptomycin, tobramycin  vaccines Talk to your doctor or health care professional before taking any of these medicines:  acetaminophen  aspirin  ibuprofen  ketoprofen  naproxen This list may not describe all possible interactions. Give your health care provider a list of all the medicines, herbs, non-prescription drugs, or dietary supplements you use. Also tell them if you smoke, drink alcohol, or use illegal drugs. Some items may interact with your medicine. What should I watch for while using this medicine? Your condition will be monitored carefully while you are receiving this medicine. You will need important blood work done while you are taking this medicine. This drug may make you feel generally unwell. This is not uncommon, as chemotherapy can affect healthy cells as well as cancer cells. Report any side effects. Continue your course of treatment even though you feel ill unless your  doctor tells you to stop. In some cases, you may be given additional medicines to help with side effects. Follow all directions for their use. Call your doctor or health care professional for advice if you  get a fever, chills or sore throat, or other symptoms of a cold or flu. Do not treat yourself. This drug decreases your body's ability to fight infections. Try to avoid being around people who are sick. This medicine may increase your risk to bruise or bleed. Call your doctor or health care professional if you notice any unusual bleeding. Be careful brushing and flossing your teeth or using a toothpick because you may get an infection or bleed more easily. If you have any dental work done, tell your dentist you are receiving this medicine. Avoid taking products that contain aspirin, acetaminophen, ibuprofen, naproxen, or ketoprofen unless instructed by your doctor. These medicines may hide a fever. Do not become pregnant while taking this medicine. Women should inform their doctor if they wish to become pregnant or think they might be pregnant. There is a potential for serious side effects to an unborn child. Talk to your health care professional or pharmacist for more information. Do not breast-feed an infant while taking this medicine. What side effects may I notice from receiving this medicine? Side effects that you should report to your doctor or health care professional as soon as possible:  allergic reactions like skin rash, itching or hives, swelling of the face, lips, or tongue  signs of infection - fever or chills, cough, sore throat, pain or difficulty passing urine  signs of decreased platelets or bleeding - bruising, pinpoint red spots on the skin, black, tarry stools, nosebleeds  signs of decreased red blood cells - unusually weak or tired, fainting spells, lightheadedness  breathing problems  changes in hearing  changes in vision  chest pain  high blood pressure  low blood counts - This drug may decrease the number of white blood cells, red blood cells and platelets. You may be at increased risk for infections and bleeding.  nausea and vomiting  pain, swelling, redness or  irritation at the injection site  pain, tingling, numbness in the hands or feet  problems with balance, talking, walking  trouble passing urine or change in the amount of urine Side effects that usually do not require medical attention (report to your doctor or health care professional if they continue or are bothersome):  hair loss  loss of appetite  metallic taste in the mouth or changes in taste This list may not describe all possible side effects. Call your doctor for medical advice about side effects. You may report side effects to FDA at 1-800-FDA-1088. Where should I keep my medicine? This drug is given in a hospital or clinic and will not be stored at home. NOTE: This sheet is a summary. It may not cover all possible information. If you have questions about this medicine, talk to your doctor, pharmacist, or health care provider.  2020 [employer]/Gold Standard (2007-04-05 14:38:05)

## 2019-11-03 NOTE — Progress Notes (Signed)
Pt tolerated carboplatin infusion today without any issues.

## 2019-11-03 NOTE — Patient Instructions (Signed)

## 2019-11-04 LAB — CANCER ANTIGEN 27.29: CA 27.29: 70.5 U/mL — ABNORMAL HIGH (ref 0.0–38.6)

## 2019-11-06 ENCOUNTER — Telehealth: Payer: Self-pay | Admitting: Hematology

## 2019-11-06 NOTE — Telephone Encounter (Signed)
-----   Message from Arman Bogus, RN sent at 11/03/2019 12:48 PM EDT ----- Regarding: Burr Medico, 1st time carboplatin Burr Medico, 1st time carboplatin, tolerated well

## 2019-11-06 NOTE — Telephone Encounter (Signed)
Called pt to see how she did with her new drug last week.  She reports through interpreter that she is doing fine, no problems & knows to call if she has concerns.

## 2019-11-06 NOTE — Telephone Encounter (Signed)
Scheduled appointments per 10/22 los. Called patient, no answer. Left message for patient with appointments dates and times.  

## 2019-11-08 NOTE — Progress Notes (Addendum)
[place]   [phone] [phone]   Clinic Follow up Note   Patient Care Team: [name], [name], [name] as PCP - General (Physician Assistant) [name], MD as Consulting Physician (Orthopedic Surgery)  Date of Service:  11/10/2019  CHIEF COMPLAINT: f/u of metastatic breast cancer  SUMMARY OF ONCOLOGIC HISTORY: Oncology History Overview Note   Cancer Staging No matching staging information was found for the patient.     [name], right ([name])  11/1997 Cancer Diagnosis   Patient had 1.4 cm poorly differentiated right breast carcinoma diagnosed in Nov 1999 at age 17, 1/7 axillary nodes involved, ER/PR and HER 2 positive. She had mastectomy with the 7 axillary node evaluation, 4 cycles of adriamycin/ cytoxan followed by taxotere, then five years of tamoxifen thru June 2005 (no herceptin in 1999). She had bilateral oophorectomy in May 2005. She was briefly on aromatase inhibitor after tamoxifen, but discontinued this herself due to poor tolerance.   04/04/2015 Genetic Testing   Negative genetic testing on the breast/ovarian cancer pnael.  The Breast/Ovarian gene panel offered by [facility] includes sequencing and rearrangement analysis for the following 20 genes:  ATM, BARD1, BRCA1, BRCA2, BRIP1, CDH1, CHEK2, EPCAM, FANCC, MLH1, MSH2, MSH6, NBN, PALB2, PMS2, PTEN, RAD51C, RAD51D, TP53, and XRCC2.   06/26/2015 Pathology Results   Initial Path Report Bone, curettage, Left femur met, intramedullary subtroch tissue - METASTATIC ADENOCARCINOMA.  Estrogen Receptor: 95%, POSITIVE, STRONG STAINING INTENSITY Progesterone Receptor: 2%, POSITIVE, STRONG  HER2 (+), IHC 3+   06/26/2015 Surgery   Biopsy of metastatic tissue and stabilization with Affixus trochanteric nail, proximal and distal interlock of left femur by Dr. [name]    06/28/2015 Imaging   CT Chest w/ Contrast 1. Extensive right internal mammary chain  lymphadenopathy consistent with metastatic breast cancer. 2. Lytic metastases involving the posterior elements at T1 with probable nondisplaced pathologic fracture of the spinous process. 3. No other evidence of thoracic metastatic disease. 4. Trace bilateral pleural effusions with associated bibasilar atelectasis. 5. Indeterminate right thyroid nodule, not previously imaged. This could be evaluated with thyroid ultrasound as clinically warranted.   06/29/2015 -  Chemotherapy   Zometa started monthly on 06/29/15 and switched to q70month from 01/08/17   07/01/2015 Imaging   Bone Scan 1. Increased activity noted throughout the left femur. Although metastatic disease cannot be excluded, these changes are most likely secondary to prior surgery. 2. No other focal abnormalities identified to suggest metastatic disease.   07/12/2015 - 07/26/2015 Radiation Therapy   Left femur, 30 Gy in 10 fractions by Dr. [name]   07/14/2015 Initial Diagnosis   [name], right ([name]   07/19/2015 Imaging   Initial PET Scan 1. Severe multifocal osseous metastatic disease, much greater than anticipated based on prior imaging. 2. Multifocal nodal metastases involving the right internal mammary, prevascular and subpectoral lymph nodes. No axillary pulmonary involvement identified. 3. No distant extra osseous metastases.   07/30/2015 - 11/29/2015 Chemotherapy   Docetaxel, Herceptin and Perjeta every 3 weeks X6 cycles, pt tolerated moderately well, restaging scan showed excellent response    09/18/2015 Imaging   CT Head w/wo Contrast 1. No acute intracranial abnormality or significant interval change. 2. Stable minimal periventricular white matter hypoattenuation on the right. This may reflect a remote ischemic injury. 3. No evidence for metastatic disease to the brain. 4. No focal soft tissue lesion to explain the patient's tenderness.   10/30/2015 Imaging  CT Abdomen Pelvis w/  Contrast 1. Few mildly prominent fluid-filled loops of small bowel scattered throughout the abdomen, with intraluminal fluid density within the distal colon. Given the provided history, findings are suggestive of acute enteritis/diarrheal illness. 2. No other acute intra-abdominal or pelvic process identified. 3. Widespread osseous metastatic disease, better evaluated on most recent PET-CT from 07/19/2015. No pathologic fracture or other complication. 4. 11 mm hypodensity within the left kidney. This lesion measures intermediate density, and is indeterminate. While this lesion is similar in size relative to recent studies, this is increased in size relative to prior study from 2012. Further evaluation with dedicated renal mass protocol CT and/or MRI is recommended for complete characterization.   12/2015 - 05/14/2016 Chemotherapy   Maintenance Herceptin and pejeta every 3 weeks stopped on 05/14/16 due to disease progression   12/25/2015 Imaging   Restaging PET Scan 1. Markedly improved skeletal activity, with only several faint foci of residual accentuated metabolic activity, but resolution of the vast majority of the previously extensive osseous metastatic disease. 2. Resolution of the prior right internal mammary and right prevascular lymph nodes. 3. Residual subcutaneous edema and edema along fascia planes in the left upper thigh. This remains somewhat more than I would expect for placement of an IM nail 6 weeks ago. If there is leg swelling further down, consider Doppler venous ultrasound to rule out left lower extremity DVT. I do not perceive an obvious difference in density between the pelvic and common femoral veins on the noncontrast CT data. 4. Chronic right maxillary and right sphenoid sinusitis.   01/16/2016 - 10/20/2017 Anti-estrogen oral therapy   Letrozole 2.5 mg daily. She was switched to exemestane due to disease progression on 02/08/17. Stopped on 10/20/2017 when treatment changed to chemo     03/01/2016 Miscellaneous   Patient presented to ED following a fall; she reports she landed on her back and is now experiencing back pain. The patient was evaluated and discharge home same day with pain control.   05/01/2016 Imaging   CT CAP IMPRESSION: 1. Stable exam.  No new or progressive disease identified. 2. No evidence for residual or recurrent adenopathy within the chest. 3. Stable bone metastasis.   05/01/2016 Imaging   BONE SCAN IMPRESSION: 1. Small focal uptake involving the anterior rib ends of the left second and right fourth ribs, new since the prior bone scan. The location of this uptake is more suggestive of a traumatic or inflammatory etiology as opposed to metastatic disease. No other evidence of metastatic disease. 2. There are other areas of stable uptake which are most likely degenerative/reactive, including right shoulder and cervical spine uptake and uptake along the right femur adjacent to the ORIF hardware.   05/20/2016 Imaging   NM PET Skull to Thigh IMPRESSION: 1. There multiple metastatic foci in the bony pelvis, including some new abnormal foci compare to the prior PET-CT, compatible with mildly progressive bony metastatic disease. Also, a left T11 vertebral hypermetabolic metastatic lesion is observed, new compared to the prior exam. 2. No active extraosseous malignancy is currently identified. 3. Right anterior fourth rib healing fracture, appears be   06/04/2016 - 01/08/2017 Chemotherapy   Second line chemotherapy Kadcyla every 3 weeks started on 06/04/16 and stopped on 01/08/17 due to mixed response.     10/05/2016 Imaging   CT CAP and Bone Scan:  Bones: left intramedullary rod and left femoral head neck screw in place. In the proximal diaphysis of the left femur there is cortical thickening and   irregularity. No lytic or blastic bone lesions. No fracture or vertebral endplate destruction.   No definite evidence of recurrent disease in the  chest, abdomen, or pelvis.   10/28/2016 Imaging   CT A/P IMPRESSION: No acute process demonstrated in the abdomen or pelvis. No evidence of bowel obstruction or inflammation.   11/04/2016 Imaging   DG foot complete left: IMPRESSION: No fracture or dislocation.  No soft tissue abnormality   11/23/2016 Imaging   CT RIGHT FEMUR IMPRESSION: Negative CT scan of the right thigh.  No visible metastatic disease.  CT LEFT FEMUR IMPRESSION: 1. Postsurgical changes related to prior cephalomedullary rod fixation of the left femur with linear lucency along the anterolateral proximal femoral cortex at level of the lesser trochanter, suspicious for nondisplaced fracture. 2. Slightly permeative appearance of the proximal left femoral cortex at the site of known prior osseous metastases is largely unchanged. 3. Unchanged patchy lucency and sclerosis along the anterior acetabulum, which appears to correspond with an area of faint uptake on the prior PET-CT, suspicious for osseous metastasis. These results will be called to the ordering clinician or representative by the Radiologist Assistant, and communication documented in the PACS or zVision Dashboard.     01/27/2017 Imaging   IMPRESSION: 1. Mixed response to osseous metastasis. Some hypermetabolic lesions are new and increasingly hypermetabolic, while another index lesion is decreasingly hypermetabolic. 2. No evidence of hypermetabolic soft tissue metastasis.    02/08/2017 - 10/19/2017 Antibody Plan   -Herceptin every 3 week since 02/08/17 -Oral Lanpantinib 1562m and decreased to 5045mdue to diarrhea and depression. Due to disease progression she swithced to Verzenio 15035mn 05/04/17. She did not toelrate well so she was switched to Ibrance 100m50m 05/31/17. Due to her neutropenia, Ibrance dose reduced to 75 mg daily, 3 weeks on, one-week off, stopped on 10/20/2017 due to disease progression.    04/30/2017 PET scan   IMPRESSION: 1.  Primarily increase in hypermetabolism of osseous metastasis. An isolated right acetabular lesion is decreased in hypermetabolism since the prior exam. 2. No evidence of hypermetabolic soft tissue metastasis. 3. Similar non FDG avid right-sided thyroid nodule, favoring a benign etiology. Recommend attention on follow-up.   08/30/2017 PET scan   08/30/2017 PET Scan  IMPRESSION: 1. Worsening osseous metastatic disease. 2. Focal hypermetabolism in the central spinal canal the L1 level, similar to the prior exam. Metastatic implant cannot be excluded. 3. Slight enlargement of a right thyroid nodule which now appears more cystic in character. Consider further evaluation with thyroid ultrasound. If patient is clinically hyperthyroid, consider nuclear medicine thyroid uptake and scan   08/30/2017 Progression   08/30/2017 PET Scan  IMPRESSION: 1. Worsening osseous metastatic disease. 2. Focal hypermetabolism in the central spinal canal the L1 level, similar to the prior exam. Metastatic implant cannot be excluded. 3. Slight enlargement of a right thyroid nodule which now appears more cystic in character. Consider further evaluation with thyroid ultrasound. If patient is clinically hyperthyroid, consider nuclear medicine thyroid uptake and scan   09/29/2017 - 10/11/2017 Radiation Therapy   Pt received 27 Gy in 9 fractions directed at the areas of significant uptake noted on recent bone scan the right pelvis and right proximal femur, with Dr, KinaSondra Come10/09/2017 - 04/07/2018 Chemotherapy   -Herceptin every 3 weeks starting 02/08/17, perjeta added on 10/20/2017  -weekly Taxol started on 10/21/2017. Will reduce Taxol to 2 weeks on, 1 week off starting on 02/04/17.  Stoped due to mild disease progression  10/27/2017 Imaging   10/27/2017 CT AP IMPRESSION: 1. No acute abnormality. No evidence of bowel obstruction or acute bowel inflammation. Normal appendix. 2. Stable patchy sclerotic osseous  metastases throughout the visualized skeleton. No new or progressive metastatic disease in the abdomen or pelvis.   01/11/2018 Imaging   Whole Body Bone Scan 01/11/18  IMPRESSION: 1. Multifocal osseous metastases as described. 2. Right scapular metastasis. 3. Right superior thoracic spinous process. 4. Right sacral ala and right L5 metastases. 5. Right acetabular metastasis. 6. Right proximal femur metastasis. 7. Diffuse metastases throughout the left femur. 8. Anterior right fourth rib metastasis.   01/11/2018 Imaging   CT CAP W Contrast 01/11/18  IMPRESSION: 1. Similar-appearing osseous metastatic disease. 2. No evidence for progressed metastatic disease in the chest, abdomen or pelvis.   03/30/2018 Imaging   Bone Scan 03/30/18  IMPRESSION: Stable multifocal bony metastatic disease.   03/30/2018 Imaging    CT CAP W Contrast 03/30/18 IMPRESSION: 1. New mild contiguous wall thickening with associated mucosal hyperenhancement throughout the left colon and rectum, suggesting nonspecific infectious or inflammatory proctocolitis. Differential includes C diff colitis. 2. Stable faint patchy sclerosis in the iliac bones and lumbar vertebral bodies. No new or progressive osseous metastatic disease by CT. Please note that the osseous lesions are relatively occult by CT and would be better evaluated by PET-CT, as clinically warranted. 3. No evidence of extra osseous metastatic disease.  These results will be called to the ordering clinician or representative by the Radiologist Assistant, and communication documented in the PACS or zVision Dashboard.   04/15/2018 - 06/16/2018 Chemotherapy   Enhertu q3weeks starting 04/15/18. Stopped Enhertu after cycle 3 on 05/27/18 due to poor toleration due to poor toleration with anorexia, weight loss, N&V, diarrhea   04/21/2018 PET scan   PET 04/21/18  IMPRESSION: 1. Overall marked interval improvement in bony hypermetabolic disease. All of the  previously identified hypermetabolic osseous metastases have decreased in hypermetabolism in the interval. There is a new tiny focus of hypermetabolism in the right aspect of the L3 vertebral body that appears to correspond to a new 8 mm lucency, raising concern for new metastatic disease. Close attention on follow-up recommended. 2. Focal hypermetabolism in the central spinal canal at the level of L1 previously has resolved. 3. No hypermetabolic soft tissue disease on today's study. 4. Right thyroid nodule seen previously has decreased in the interval.    06/16/2018 Imaging   CT LUMBAR SPINE WO CONTRAST 06/16/18 IMPRESSION: 1. Transitional lumbosacral anatomy. Correlation with radiographs is recommended prior to any operative intervention. 2. Suspicion of a right L3 vertebral body metastasis on the April PET-CT is not evident by CT. Stable degenerative versus metastatic lesion at the right L5 superior endplate. Small but increased right iliac bone metastasis since March. Lumbar MRI would best characterize lumbar metastatic disease (without contrast if necessary). 3. Suspect L2-L3 left subarticular segment disc herniation. Query left L2 radiculitis. 4. Stable appearance of rightward L4-L5 disc degeneration which could affect the right foramen and right lateral recess.   06/17/2018 - 11/16/2018 Chemotherapy   Restart Taxol 2 weeks on/1 week off with Herceptin and Perjeta q3weeks on 06/17/18. Perjeta was stopped after C5 due to diarrhea. Stopped Taxol after 11/16/18 due to disease progression to L3.    07/06/2018 PET scan   PET  IMPRESSION: 1. Near total resolution of hypermetabolic activity associated with skeletal metastasis. Very mild activity associated with the LEFT iliac bone which is similar to background blood pool activity. 2. No new   or progressive disease.   11/21/2018 PET scan   IMPRESSION: 1. New focal hypermetabolic lesion eccentric to the right in the L3 vertebral body,  maximum SUV 6.1 compatible with a new focus of active malignancy. 2. Very low-grade activity similar to blood pool in previous existing mildly sclerotic metastatic lesions including the left iliac bone lesion. Some of the mildly sclerotic lesions are not hypermetabolic compatible with effectively treated bony metastatic disease. 3. No hypermetabolic soft tissue lesions are identified.     11/30/2018 - 08/25/2019 Chemotherapy   Continue Herceptin q3weeks. Stopped 08/25/19 due to significant disease progression and switched to  IV Matuzumab   12/12/2018 - 12/17/2018 Chemotherapy   Tucatinib (TUKYSA) 6 tabs BID as a next line therapy starting 12/12/18. Stopped 12/17/18 duet o N&V, poor appetite and HA    12/21/2018 - 03/17/2019 Chemotherapy   IV Eribulin on day 1, 8 every 21 days starting 12/21/18. Stopped 03/17/19 due to mixed response to treatment.    03/21/2019 PET scan   IMPRESSION: 1. Mixed response to therapy of osseous metastasis. Although a right-sided L4 lesion demonstrates decreased hypermetabolism today, new left iliac and right ischial hypermetabolic lesions are seen. 2. No evidence of hypermetabolic soft tissue metastasis.     03/27/2019 - 07/21/2019 Chemotherapy   Neratinib (Nerlynx) titrate up from 152m to 2427mstarting 03/27/19. She could not tolerate 16068mnd stopped on 02/05/19 due to N&V and diarrhea. Will restart at 120m64m 04/10/18-04/27/19 and 4/19-4/22. Reduced to 80mg60me daily on 05/10/19. Held/stopped since 07/21/19 due to dry cough, Nausea and body aches.   06/23/2019 Imaging   CT Head  IMPRESSION: 1. No acute intracranial abnormality or metastatic disease identified. 2. Chronic white matter disease in the right hemisphere is stable from prior exams.     09/05/2019 PET scan   IMPRESSION: 1. Significant progression of widespread hypermetabolic osseous metastases throughout the axial skeleton as detailed. 2. No hypermetabolic extraosseous sites of metastatic disease.    09/15/2019 -  Chemotherapy   IV Margetuximab q3weeks and IV Gemcitabine 2 weeks on/1 week off starting 09/15/19. Carboplatin added with Gemcitabine starting with C3 on 11/03/19      CURRENT THERAPY:  -Zometa q3mont74monthMargetuximabq3weeks and IV Gemcitabine2 weeks on/1 week offstarting 09/15/19. Carboplatin added to Gemcitabine starting with C3 on 11/03/19  INTERVAL HISTORY:  Debra EDISON WOLLSCHLAGERre for a follow up. She presents to the clinic with her interpretor. She notes she is doing well. She notes she went to [facility] this weekend for Ambien and was not there. She has been using them at half tablets. She notes she tolerated the addition of carbo well with mild increase in fatigue. She has had taste change as well. She notes she tolerates Onpro but finds is annoying when she tries to sleep and roll over. She wonders what her bone density is.  She plans to get COVID vaccine next week in our clinic.     REVIEW OF SYSTEMS:   Constitutional: Denies fevers, chills or abnormal weight loss Eyes: Denies blurriness of vision Ears, nose, mouth, throat, and face: Denies mucositis or sore throat Respiratory: Denies cough, dyspnea or wheezes Cardiovascular: Denies palpitation, chest discomfort or lower extremity swelling Gastrointestinal:  Denies nausea, heartburn or change in bowel habits Skin: Denies abnormal skin rashes Lymphatics: Denies new lymphadenopathy or easy bruising Neurological:Denies numbness, tingling or new weaknesses Behavioral/Psych: Mood is stable, no new changes  All other systems were reviewed with the patient and are negative.  MEDICAL HISTORY:  Past  Medical History:  Diagnosis Date  . Abnormal Pap smear   . Anxiety   . Breast cancer ([place])   . Cancer ([place]) 1999   breast-s/p mastectomy, chemo, rad  . CIN I (cervical intraepithelial neoplasia I) 2003   by colpo  . Congenital deafness   . Deaf   . Depression   . Port-A-Cath in place 2017   for chemotherapy  .  Radiation 07/12/15-07/26/15   left femur 30 Gy  . S/P bilateral oophorectomy     SURGICAL HISTORY: Past Surgical History:  Procedure Laterality Date  . ABDOMINAL HYSTERECTOMY    . INTRAMEDULLARY (IM) NAIL INTERTROCHANTERIC Left 06/26/2015   Procedure: LEFT BIOMET LONG AFFIXS NAIL;  Surgeon: [name], MD;  Location: [facility];  Service: Orthopedics;  Laterality: Left;  . IR GENERIC HISTORICAL  08/06/2015   IR US GUIDE VASC ACCESS LEFT 08/06/2015 Aletta Edouard, MD [facility]  . IR GENERIC HISTORICAL  08/06/2015   IR FLUORO GUIDE CV LINE LEFT 08/06/2015 Aletta Edouard, MD [facility]  . IR GENERIC HISTORICAL  03/05/2016   IR CV LINE INJECTION 03/05/2016 Markus Daft, MD [facility]  . LEFT HEART CATH AND CORONARY ANGIOGRAPHY N/A 12/13/2017   Procedure: LEFT HEART CATH AND CORONARY ANGIOGRAPHY;  Surgeon: Leonie Man, MD;  Location: [facility];  Service: Cardiovascular;  Laterality: N/A;  . MASTECTOMY     right breast  . MASTECTOMY    . OVARIAN CYST REMOVAL    . RADIOLOGY WITH ANESTHESIA Left 06/25/2015   Procedure: MRI OF LEFT HIP WITH OR WITHOUT CONTRAST;  Surgeon: Medication Radiologist, MD;  Location: [facility];  Service: Radiology;  Laterality: Left;  DR. MCINTYRE/MRI  . TUBAL LIGATION      I have reviewed the social history and family history with the patient and they are unchanged from previous note.  ALLERGIES:  is allergic to promethazine hcl, chlorhexidine, and diphenhydramine hcl.  MEDICATIONS:  Current Outpatient Medications  Medication Sig Dispense Refill  . ALPRAZolam (XANAX) 0.25 MG tablet Take 1 tablet (0.25 mg total) by mouth 2 (two) times daily as needed for anxiety. 30 tablet 0  . diphenoxylate-atropine (LOMOTIL) 2.5-0.025 MG tablet Take 1-2 tablets by mouth 4 (four) times daily as needed for diarrhea or loose stools. 60 tablet 1  . guaiFENesin-codeine 100-10 MG/5ML syrup Take 5 mLs by mouth every 4 (four) hours as needed for cough. 180 mL 0  .  lidocaine-prilocaine (EMLA) cream Apply 1 application topically as needed. Apply to Porta-Cath 1-2 hours prior to access as directed. 90 g 2  . ondansetron (ZOFRAN) 8 MG tablet Take 1 tablet (8 mg total) by mouth every 8 (eight) hours as needed for nausea or vomiting. 45 tablet 1  . oxyCODONE (ROXICODONE) 5 MG immediate release tablet Take 1 tablet (5 mg total) by mouth every 8 (eight) hours as needed for severe pain. 60 tablet 0  . potassium chloride (KLOR-CON) 10 MEQ tablet Take 3 tablets (30 mEq total) by mouth daily. 90 tablet 2  . prochlorperazine (COMPAZINE) 10 MG tablet Take 1 tablet (10 mg total) by mouth 2 (two) times daily as needed for nausea or vomiting. 10 tablet 0  . zolpidem (AMBIEN) 10 MG tablet Take 1 tablet (10 mg total) by mouth at bedtime as needed for sleep. 15 tablet 2   No current facility-administered medications for this visit.   Facility-Administered Medications Ordered in Other Visits  Medication Dose Route Frequency Provider Last Rate Last Admin  . CARBOplatin (PARAPLATIN)  100 mg in sodium chloride 0.9 % 100 mL chemo infusion  100 mg Intravenous Once Truitt Merle, MD      . gemcitabine (GEMZAR) 1,330 mg in sodium chloride 0.9 % 250 mL chemo infusion  800 mg/m2 (Treatment Plan Recorded) Intravenous Once Truitt Merle, MD      . heparin lock flush 100 unit/mL  500 Units Intracatheter Once PRN Truitt Merle, MD      . pegfilgrastim (NEULASTA ONPRO KIT) injection 6 mg  6 mg Subcutaneous Once Truitt Merle, MD      . potassium chloride SA (K-DUR) CR tablet 40 mEq  40 mEq Oral Once Truitt Merle, MD      . sodium chloride 0.9 % 1,000 mL with potassium chloride 10 mEq infusion   Intravenous Continuous Gordy Levan, MD   Stopped at 09/10/15 1653  . sodium chloride 0.9 % injection 10 mL  10 mL Intravenous PRN Livesay, Lennis P, MD      . sodium chloride flush (NS) 0.9 % injection 10 mL  10 mL Intracatheter PRN Truitt Merle, MD        PHYSICAL EXAMINATION: ECOG PERFORMANCE STATUS: 2 -  Symptomatic, <50% confined to bed Blood pressure 112/60 weight 133 pounds, heart rate is 65, respirate 18 temperature 36.9, pulse ox 99% on room air Due to COVID19 we will limit examination to appearance. Patient had no complaints.  GENERAL:alert, no distress and comfortable SKIN: skin color normal, no rashes or significant lesions EYES: normal, Conjunctiva are pink and non-injected, sclera clear  NEURO: alert & oriented x 3 with fluent speech    LABORATORY DATA:  I have reviewed the data as listed CBC Latest Ref Rng & Units 11/10/2019 11/03/2019 10/25/2019  WBC 4.0 - 10.5 K/uL 2.2(L) 5.5 19.0(H)  Hemoglobin 12.0 - 15.0 g/dL 8.6(L) 9.5(L) 8.4(L)  Hematocrit 36 - 46 % 25.6(L) 28.3(L) 25.7(L)  Platelets 150 - 400 K/uL 159 256 73(L)     CMP Latest Ref Rng & Units 11/10/2019 11/03/2019 10/25/2019  Glucose 70 - 99 mg/dL 88 87 85  BUN 6 - 20 mg/dL _0 Creatinine 0.44 - 1.00 mg/dL 0.84 0.82 0.78  Sodium 135 - 145 mmol/L 140 142 139  Potassium 3.5 - 5.1 mmol/L 3.6 3.3(L) 3.3(L)  Chloride 98 - 111 mmol/L 108 109 109  CO2 22 - 32 mmol/L _1 Calcium 8.9 - 10.3 mg/dL 8.8(L) 9.0 7.8(L)  Total Protein 6.5 - 8.1 g/dL 6.1(L) 6.1(L) -  Total Bilirubin 0.3 - 1.2 mg/dL 1.6(H) 1.3(H) -  Alkaline Phos 38 - 126 U/L 70 102 -  AST 15 - 41 U/L 20 16 -  ALT 0 - 44 U/L 39 14 -      RADIOGRAPHIC STUDIES: I have personally reviewed the radiological images as listed and agreed with the findings in the report. No results found.   ASSESSMENT & PLAN:  [name] is a 54 y.o. 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 She had right mastectomy, 4 cycles of AC-T and 5 years of Tamoxifen/AI.  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 -After her first line ofchemotreatment and initial maintenance therapy, she progressed or had poor toleration  of multiple lines of treatment since then. Latest in 08/2019. -Ipreviouslydiscussed after multiple treatments tried, her remaining options are fewer. I recommendednext-line (7th line)with anti-HER2IV Margetuximabq3weekswithIV Chemo weeklyGemcitabine2 weeks  on/1 week off.She started on9/3/21. -S/p C2 she has developed worsening chest pain and her tumor marker has been increasing trending up, which is concerning for cancer progression. I added carboplatin AUC 1 starting with C3.   -S/p C3D1 she tolerated the addition of carbo well with mild increase in fatigue and taste change. Labs reviewed, WBC 2.2, Hg 8.6, ANC 1.1, Ca 8.8, protein 6.1, tbili 1.6. Overall adequate to proceed with C3D8 Gemcitabine and Carboplatin today -Given her neutropenia, will give Onpro this week.  -F/u 2 weeks before next cycle   2.Body pain, secondary to bone mets, Hypocalcemia, leg cramps  -She is being treated with Zometa q72month. Next injection in 01/2020 -She will continue oral calcium and Vit Dand Prilosec for Acid Reflux. -Current pain includes, lower back, upper legs, hips and rightshoulder.PET8/24/21does indicate disease progression in her spine, pelvis and right scapula. -Pain is currently controlled on 560mOxycodone1-2 tabs daily.  -She had a severe episode of chest pain, work-up was negative, probably related to her bone metastasis  3.Congenital deafness  4.Mild anemia, secondary to chemotherapy and malignancy -Stable   5.Hypokalemia, Hypomagnesium, secondary toDiarrhea -She developed diarrhea s/p cycle 3 Perjetaand again with Nerlynx, Both have been D/c. -She is onOral potassium 1048mID.She stopped oral Mag because it made her nauseous.  -She can take more potassium when she has diarrhea.  6.Goal of care discussion, DNR/DNI -Wepreviouslyreviewed goal of care andI recommend DNR/DNI, sheagreed.  -I encouraged her to start setting up her living will.We previously  discussed hospice care if she has no more treatment available or could not tolerate more treatment  7.Insomnia -She cannot take Benadryl due to allergy reaction. She has tried trazodone with no relief.  -She is on Xanax and Ambien5mg68ms nightly. I prescribe at 10mg8men(take half tablet) given insurance.She knows not to take both at the same time.   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 Stable  9. Mildhyperbilirubinemia  -likely second to chemotherapy, fluctuates  -Will monitor closely. Stable.   PLAN: -ECHO on 12/11/19 after COVID vaccine   -I refilled Ambien with paper script instead today  -Labs reviewed and adequate to proceed with C3D8 Gemcitabine and Carboplatin today with Onpro -Lab, Flush, Gemcitabine and Carboplatin in 2 and 3 weeks  -Margetuximab in 2 weeks  -F/u in 2 weeks    No problem-specific Assessment & Plan notes found for this encounter.   Orders Placed This Encounter  Procedures  . ECHOCARDIOGRAM COMPLETE    Standing Status:   Future    Standing Expiration Date:   11/09/2020    Order Specific Question:   Where should this test be performed    Answer:   [facility] Specific Question:   Perflutren DEFINITY (image enhancing agent) should be administered unless hypersensitivity or allergy exist    Answer:   Administer Perflutren    Order Specific Question:   Is a special reader required? (athlete or structural heart)    Answer:   No    Order Specific Question:   Does this study need to be read by the Structural team/Level 3 readers?    Answer:   No    Order Specific Question:   Reason for exam-Echo    Answer:   Chemo  V67.2 / Z09   All questions were answered. The patient knows to call the clinic with any problems, questions or concerns. No barriers to learning was detected. The total time spent in the appointment was 30 minutes.     Marajade Lei FTruitt Merle  MD 11/10/2019   Oneal Deputy, am acting as scribe for Truitt Merle, MD.   I have  reviewed the above documentation for accuracy and completeness, and I agree with the above.

## 2019-11-10 ENCOUNTER — Other Ambulatory Visit: Payer: Medicaid Other

## 2019-11-10 ENCOUNTER — Inpatient Hospital Stay: Payer: Medicaid Other

## 2019-11-10 ENCOUNTER — Inpatient Hospital Stay (HOSPITAL_BASED_OUTPATIENT_CLINIC_OR_DEPARTMENT_OTHER): Payer: Medicaid Other | Admitting: Hematology

## 2019-11-10 ENCOUNTER — Ambulatory Visit: Payer: Medicaid Other

## 2019-11-10 ENCOUNTER — Encounter: Payer: Self-pay | Admitting: Hematology

## 2019-11-10 ENCOUNTER — Other Ambulatory Visit: Payer: Self-pay

## 2019-11-10 VITALS — BP 112/64 | HR 65 | Temp 98.4°F | Resp 18 | Wt 133.0 lb

## 2019-11-10 DIAGNOSIS — G4700 Insomnia, unspecified: Secondary | ICD-10-CM | POA: Diagnosis not present

## 2019-11-10 DIAGNOSIS — C7951 Secondary malignant neoplasm of bone: Secondary | ICD-10-CM

## 2019-11-10 DIAGNOSIS — C50911 Malignant neoplasm of unspecified site of right female breast: Secondary | ICD-10-CM

## 2019-11-10 DIAGNOSIS — F419 Anxiety disorder, unspecified: Secondary | ICD-10-CM | POA: Diagnosis not present

## 2019-11-10 DIAGNOSIS — Z95828 Presence of other vascular implants and grafts: Secondary | ICD-10-CM

## 2019-11-10 DIAGNOSIS — Z5112 Encounter for antineoplastic immunotherapy: Secondary | ICD-10-CM | POA: Diagnosis not present

## 2019-11-10 LAB — MAGNESIUM: Magnesium: 1.8 mg/dL (ref 1.7–2.4)

## 2019-11-10 LAB — CMP (CANCER CENTER ONLY)
ALT: 39 U/L (ref 0–44)
AST: 20 U/L (ref 15–41)
Albumin: 3.7 g/dL (ref 3.5–5.0)
Alkaline Phosphatase: 70 U/L (ref 38–126)
Anion gap: 8 (ref 5–15)
BUN: 14 mg/dL (ref 6–20)
CO2: 24 mmol/L (ref 22–32)
Calcium: 8.8 mg/dL — ABNORMAL LOW (ref 8.9–10.3)
Chloride: 108 mmol/L (ref 98–111)
Creatinine: 0.84 mg/dL (ref 0.44–1.00)
GFR, Estimated: >60 mL/min (ref >60)
Glucose, Bld: 88 mg/dL (ref 70–99)
Potassium: 3.6 mmol/L (ref 3.5–5.1)
Sodium: 140 mmol/L (ref 135–145)
Total Bilirubin: 1.6 mg/dL — ABNORMAL HIGH (ref 0.3–1.2)
Total Protein: 6.1 g/dL — ABNORMAL LOW (ref 6.5–8.1)

## 2019-11-10 LAB — CBC WITH DIFFERENTIAL (CANCER CENTER ONLY)
Abs Immature Granulocytes: 0.02 10*3/uL (ref 0.00–0.07)
Basophils Absolute: 0 10*3/uL (ref 0.0–0.1)
Basophils Relative: 1 %
Eosinophils Absolute: 0.1 10*3/uL (ref 0.0–0.5)
Eosinophils Relative: 3 %
HCT: 25.6 % — ABNORMAL LOW (ref 36.0–46.0)
Hemoglobin: 8.6 g/dL — ABNORMAL LOW (ref 12.0–15.0)
Immature Granulocytes: 1 %
Lymphocytes Relative: 37 %
Lymphs Abs: 0.8 10*3/uL (ref 0.7–4.0)
MCH: 31.4 pg (ref 26.0–34.0)
MCHC: 33.6 g/dL (ref 30.0–36.0)
MCV: 93.4 fL (ref 80.0–100.0)
Monocytes Absolute: 0.1 10*3/uL (ref 0.1–1.0)
Monocytes Relative: 7 %
Neutro Abs: 1.1 10*3/uL — ABNORMAL LOW (ref 1.7–7.7)
Neutrophils Relative %: 51 %
Platelet Count: 159 10*3/uL (ref 150–400)
RBC: 2.74 MIL/uL — ABNORMAL LOW (ref 3.87–5.11)
RDW: 16.2 % — ABNORMAL HIGH (ref 11.5–15.5)
WBC Count: 2.2 10*3/uL — ABNORMAL LOW (ref 4.0–10.5)
nRBC: 0 % (ref 0.0–0.2)

## 2019-11-10 MED ORDER — SODIUM CHLORIDE 0.9 % IV SOLN
800.0000 mg/m2 | Freq: Once | INTRAVENOUS | Status: AC
  Administered 2019-11-10: 1330 mg via INTRAVENOUS
  Filled 2019-11-10: qty 34.98

## 2019-11-10 MED ORDER — PEGFILGRASTIM 6 MG/0.6ML ~~LOC~~ PSKT
6.0000 mg | PREFILLED_SYRINGE | Freq: Once | SUBCUTANEOUS | Status: AC
  Administered 2019-11-10: 6 mg via SUBCUTANEOUS

## 2019-11-10 MED ORDER — SODIUM CHLORIDE 0.9 % IV SOLN
Freq: Once | INTRAVENOUS | Status: AC
  Filled 2019-11-10: qty 250

## 2019-11-10 MED ORDER — PROCHLORPERAZINE MALEATE 10 MG PO TABS
10.0000 mg | ORAL_TABLET | Freq: Once | ORAL | Status: AC
  Administered 2019-11-10: 10 mg via ORAL

## 2019-11-10 MED ORDER — HEPARIN SOD (PORK) LOCK FLUSH 100 UNIT/ML IV SOLN
500.0000 [IU] | Freq: Once | INTRAVENOUS | Status: AC | PRN
  Administered 2019-11-10: 500 [IU]
  Filled 2019-11-10: qty 5

## 2019-11-10 MED ORDER — PROCHLORPERAZINE MALEATE 10 MG PO TABS
ORAL_TABLET | ORAL | Status: AC
  Filled 2019-11-10: qty 1

## 2019-11-10 MED ORDER — PEGFILGRASTIM 6 MG/0.6ML ~~LOC~~ PSKT
PREFILLED_SYRINGE | SUBCUTANEOUS | Status: AC
  Filled 2019-11-10: qty 0.6

## 2019-11-10 MED ORDER — ZOLPIDEM TARTRATE 10 MG PO TABS
10.0000 mg | ORAL_TABLET | Freq: Every evening | ORAL | 2 refills | Status: DC | PRN
Start: 2019-11-10 — End: 2020-02-12

## 2019-11-10 MED ORDER — SODIUM CHLORIDE 0.9% FLUSH
10.0000 mL | Freq: Once | INTRAVENOUS | Status: AC | PRN
  Administered 2019-11-10: 10 mL
  Filled 2019-11-10: qty 10

## 2019-11-10 MED ORDER — SODIUM CHLORIDE 0.9% FLUSH
10.0000 mL | INTRAVENOUS | Status: DC | PRN
  Administered 2019-11-10: 10 mL
  Filled 2019-11-10: qty 10

## 2019-11-10 MED ORDER — SODIUM CHLORIDE 0.9 % IV SOLN
100.0000 mg | Freq: Once | INTRAVENOUS | Status: AC
  Administered 2019-11-10: 100 mg via INTRAVENOUS
  Filled 2019-11-10: qty 10

## 2019-11-10 NOTE — Patient Instructions (Addendum)
[facility] Discharge Instructions for Patients Receiving Chemotherapy  Today you received the following chemotherapy agents: Gemzar, Carboplatin   To help prevent nausea and vomiting after your treatment, we encourage you to take your nausea medication as directed.   If you develop nausea and vomiting that is not controlled by your nausea medication, call the clinic.   BELOW ARE SYMPTOMS THAT SHOULD BE REPORTED IMMEDIATELY:  *FEVER GREATER THAN 100.5 F  *CHILLS WITH OR WITHOUT FEVER  NAUSEA AND VOMITING THAT IS NOT CONTROLLED WITH YOUR NAUSEA MEDICATION  *UNUSUAL SHORTNESS OF BREATH  *UNUSUAL BRUISING OR BLEEDING  TENDERNESS IN MOUTH AND THROAT WITH OR WITHOUT PRESENCE OF ULCERS  *URINARY PROBLEMS  *BOWEL PROBLEMS  UNUSUAL RASH Items with * indicate a potential emergency and should be followed up as soon as possible.  Feel free to call the clinic should you have any questions or concerns. The clinic phone number is [phone].  Please show the CHEMO ALERT CARD at check-in to the Emergency Department and triage nurse.   

## 2019-11-10 NOTE — Progress Notes (Signed)
Per Dr. Burr Medico, okay for patient to proceed with treatment with ANC 1.1 and total bilirubin 1.6.

## 2019-11-10 NOTE — Patient Instructions (Signed)

## 2019-11-17 ENCOUNTER — Other Ambulatory Visit: Payer: Self-pay

## 2019-11-17 ENCOUNTER — Inpatient Hospital Stay: Payer: Medicaid Other | Attending: Hematology

## 2019-11-17 ENCOUNTER — Ambulatory Visit (HOSPITAL_COMMUNITY)
Admission: RE | Admit: 2019-11-17 | Discharge: 2019-11-17 | Disposition: A | Discharge status: Home / Self Care | Payer: Medicaid Other | Source: Ambulatory Visit | Attending: Hematology | Admitting: Hematology

## 2019-11-17 DIAGNOSIS — C50911 Malignant neoplasm of unspecified site of right female breast: Secondary | ICD-10-CM | POA: Insufficient documentation

## 2019-11-17 DIAGNOSIS — D6481 Anemia due to antineoplastic chemotherapy: Secondary | ICD-10-CM | POA: Diagnosis not present

## 2019-11-17 DIAGNOSIS — Z170 Estrogen receptor positive status [ER+]: Secondary | ICD-10-CM | POA: Diagnosis not present

## 2019-11-17 DIAGNOSIS — Z0189 Encounter for other specified special examinations: Secondary | ICD-10-CM

## 2019-11-17 DIAGNOSIS — C7951 Secondary malignant neoplasm of bone: Secondary | ICD-10-CM | POA: Diagnosis not present

## 2019-11-17 DIAGNOSIS — G620 Drug-induced polyneuropathy: Secondary | ICD-10-CM | POA: Insufficient documentation

## 2019-11-17 DIAGNOSIS — Z5111 Encounter for antineoplastic chemotherapy: Secondary | ICD-10-CM | POA: Insufficient documentation

## 2019-11-17 DIAGNOSIS — Z5112 Encounter for antineoplastic immunotherapy: Secondary | ICD-10-CM | POA: Insufficient documentation

## 2019-11-17 DIAGNOSIS — E876 Hypokalemia: Secondary | ICD-10-CM | POA: Diagnosis not present

## 2019-11-17 DIAGNOSIS — Z5181 Encounter for therapeutic drug level monitoring: Secondary | ICD-10-CM | POA: Insufficient documentation

## 2019-11-17 DIAGNOSIS — G4730 Sleep apnea, unspecified: Secondary | ICD-10-CM | POA: Diagnosis not present

## 2019-11-17 DIAGNOSIS — Z79899 Other long term (current) drug therapy: Secondary | ICD-10-CM | POA: Insufficient documentation

## 2019-11-17 DIAGNOSIS — Z23 Encounter for immunization: Secondary | ICD-10-CM | POA: Diagnosis not present

## 2019-11-17 LAB — ECHOCARDIOGRAM COMPLETE
Area-P 1/2: 6.12 cm2
S' Lateral: 3.6 cm

## 2019-11-17 NOTE — Progress Notes (Signed)
  Echocardiogram 2D Echocardiogram has been performed.  [name] 11/17/2019, 10:00 AM

## 2019-11-22 NOTE — Progress Notes (Signed)
[name]   [phone] [phone]   Clinic Follow up Note   Patient Care Team: [name], [name], [name] as PCP - General (Physician Assistant) [name], MD as Consulting Physician (Orthopedic Surgery)  Date of Service:  11/24/2019  CHIEF COMPLAINT: f/u of metastatic breast cancer  SUMMARY OF ONCOLOGIC HISTORY: Oncology History Overview Note   Cancer Staging No matching staging information was found for the patient.     [name], right ([place])  11/1997 Cancer Diagnosis   Patient had 1.4 cm poorly differentiated right breast carcinoma diagnosed in Nov 1999 at age 54, 1/7 axillary nodes involved, ER/PR and HER 2 positive. She had mastectomy with the 7 axillary node evaluation, 4 cycles of adriamycin/ cytoxan followed by taxotere, then five years of tamoxifen thru June 2005 (no herceptin in 1999). She had bilateral oophorectomy in May 2005. She was briefly on aromatase inhibitor after tamoxifen, but discontinued this herself due to poor tolerance.   04/04/2015 Genetic Testing   Negative genetic testing on the breast/ovarian cancer pnael.  The Breast/Ovarian gene panel offered by [facility] includes sequencing and rearrangement analysis for the following 20 genes:  ATM, BARD1, BRCA1, BRCA2, BRIP1, CDH1, CHEK2, EPCAM, FANCC, MLH1, MSH2, MSH6, NBN, PALB2, PMS2, PTEN, RAD51C, RAD51D, TP53, and XRCC2.   06/26/2015 Pathology Results   Initial Path Report Bone, curettage, Left femur met, intramedullary subtroch tissue - METASTATIC ADENOCARCINOMA.  Estrogen Receptor: 95%, POSITIVE, STRONG STAINING INTENSITY Progesterone Receptor: 2%, POSITIVE, STRONG  HER2 (+), IHC 3+   06/26/2015 Surgery   Biopsy of metastatic tissue and stabilization with Affixus trochanteric nail, proximal and distal interlock of left femur by Dr. [name]    06/28/2015 Imaging   CT Chest w/ Contrast 1. Extensive right internal mammary chain  lymphadenopathy consistent with metastatic breast cancer. 2. Lytic metastases involving the posterior elements at T1 with probable nondisplaced pathologic fracture of the spinous process. 3. No other evidence of thoracic metastatic disease. 4. Trace bilateral pleural effusions with associated bibasilar atelectasis. 5. Indeterminate right thyroid nodule, not previously imaged. This could be evaluated with thyroid ultrasound as clinically warranted.   06/29/2015 -  Chemotherapy   Zometa started monthly on 06/29/15 and switched to q28month from 01/08/17   07/01/2015 Imaging   Bone Scan 1. Increased activity noted throughout the left femur. Although metastatic disease cannot be excluded, these changes are most likely secondary to prior surgery. 2. No other focal abnormalities identified to suggest metastatic disease.   07/12/2015 - 07/26/2015 Radiation Therapy   Left femur, 30 Gy in 10 fractions by Dr. [name]   07/14/2015 Initial Diagnosis   [name], right ([place]   07/19/2015 Imaging   Initial PET Scan 1. Severe multifocal osseous metastatic disease, much greater than anticipated based on prior imaging. 2. Multifocal nodal metastases involving the right internal mammary, prevascular and subpectoral lymph nodes. No axillary pulmonary involvement identified. 3. No distant extra osseous metastases.   07/30/2015 - 11/29/2015 Chemotherapy   Docetaxel, Herceptin and Perjeta every 3 weeks X6 cycles, pt tolerated moderately well, restaging scan showed excellent response    09/18/2015 Imaging   CT Head w/wo Contrast 1. No acute intracranial abnormality or significant interval change. 2. Stable minimal periventricular white matter hypoattenuation on the right. This may reflect a remote ischemic injury. 3. No evidence for metastatic disease to the brain. 4. No focal soft tissue lesion to explain the patient's tenderness.   10/30/2015 Imaging  CT Abdomen Pelvis w/  Contrast 1. Few mildly prominent fluid-filled loops of small bowel scattered throughout the abdomen, with intraluminal fluid density within the distal colon. Given the provided history, findings are suggestive of acute enteritis/diarrheal illness. 2. No other acute intra-abdominal or pelvic process identified. 3. Widespread osseous metastatic disease, better evaluated on most recent PET-CT from 07/19/2015. No pathologic fracture or other complication. 4. 11 mm hypodensity within the left kidney. This lesion measures intermediate density, and is indeterminate. While this lesion is similar in size relative to recent studies, this is increased in size relative to prior study from 2012. Further evaluation with dedicated renal mass protocol CT and/or MRI is recommended for complete characterization.   12/2015 - 05/14/2016 Chemotherapy   Maintenance Herceptin and pejeta every 3 weeks stopped on 05/14/16 due to disease progression   12/25/2015 Imaging   Restaging PET Scan 1. Markedly improved skeletal activity, with only several faint foci of residual accentuated metabolic activity, but resolution of the vast majority of the previously extensive osseous metastatic disease. 2. Resolution of the prior right internal mammary and right prevascular lymph nodes. 3. Residual subcutaneous edema and edema along fascia planes in the left upper thigh. This remains somewhat more than I would expect for placement of an IM nail 6 weeks ago. If there is leg swelling further down, consider Doppler venous ultrasound to rule out left lower extremity DVT. I do not perceive an obvious difference in density between the pelvic and common femoral veins on the noncontrast CT data. 4. Chronic right maxillary and right sphenoid sinusitis.   01/16/2016 - 10/20/2017 Anti-estrogen oral therapy   Letrozole 2.5 mg daily. She was switched to exemestane due to disease progression on 02/08/17. Stopped on 10/20/2017 when treatment changed to chemo     03/01/2016 Miscellaneous   Patient presented to ED following a fall; she reports she landed on her back and is now experiencing back pain. The patient was evaluated and discharge home same day with pain control.   05/01/2016 Imaging   CT CAP IMPRESSION: 1. Stable exam.  No new or progressive disease identified. 2. No evidence for residual or recurrent adenopathy within the chest. 3. Stable bone metastasis.   05/01/2016 Imaging   BONE SCAN IMPRESSION: 1. Small focal uptake involving the anterior rib ends of the left second and right fourth ribs, new since the prior bone scan. The location of this uptake is more suggestive of a traumatic or inflammatory etiology as opposed to metastatic disease. No other evidence of metastatic disease. 2. There are other areas of stable uptake which are most likely degenerative/reactive, including right shoulder and cervical spine uptake and uptake along the right femur adjacent to the ORIF hardware.   05/20/2016 Imaging   NM PET Skull to Thigh IMPRESSION: 1. There multiple metastatic foci in the bony pelvis, including some new abnormal foci compare to the prior PET-CT, compatible with mildly progressive bony metastatic disease. Also, a left T11 vertebral hypermetabolic metastatic lesion is observed, new compared to the prior exam. 2. No active extraosseous malignancy is currently identified. 3. Right anterior fourth rib healing fracture, appears be   06/04/2016 - 01/08/2017 Chemotherapy   Second line chemotherapy Kadcyla every 3 weeks started on 06/04/16 and stopped on 01/08/17 due to mixed response.     10/05/2016 Imaging   CT CAP and Bone Scan:  Bones: left intramedullary rod and left femoral head neck screw in place. In the proximal diaphysis of the left femur there is cortical thickening and   irregularity. No lytic or blastic bone lesions. No fracture or vertebral endplate destruction.   No definite evidence of recurrent disease in the  chest, abdomen, or pelvis.   10/28/2016 Imaging   CT A/P IMPRESSION: No acute process demonstrated in the abdomen or pelvis. No evidence of bowel obstruction or inflammation.   11/04/2016 Imaging   DG foot complete left: IMPRESSION: No fracture or dislocation.  No soft tissue abnormality   11/23/2016 Imaging   CT RIGHT FEMUR IMPRESSION: Negative CT scan of the right thigh.  No visible metastatic disease.  CT LEFT FEMUR IMPRESSION: 1. Postsurgical changes related to prior cephalomedullary rod fixation of the left femur with linear lucency along the anterolateral proximal femoral cortex at level of the lesser trochanter, suspicious for nondisplaced fracture. 2. Slightly permeative appearance of the proximal left femoral cortex at the site of known prior osseous metastases is largely unchanged. 3. Unchanged patchy lucency and sclerosis along the anterior acetabulum, which appears to correspond with an area of faint uptake on the prior PET-CT, suspicious for osseous metastasis. These results will be called to the ordering clinician or representative by the Radiologist Assistant, and communication documented in the PACS or zVision Dashboard.     01/27/2017 Imaging   IMPRESSION: 1. Mixed response to osseous metastasis. Some hypermetabolic lesions are new and increasingly hypermetabolic, while another index lesion is decreasingly hypermetabolic. 2. No evidence of hypermetabolic soft tissue metastasis.    02/08/2017 - 10/19/2017 Antibody Plan   -Herceptin every 3 week since 02/08/17 -Oral Lanpantinib 1562m and decreased to 5045mdue to diarrhea and depression. Due to disease progression she swithced to Verzenio 15035mn 05/04/17. She did not toelrate well so she was switched to Ibrance 100m50m 05/31/17. Due to her neutropenia, Ibrance dose reduced to 75 mg daily, 3 weeks on, one-week off, stopped on 10/20/2017 due to disease progression.    04/30/2017 PET scan   IMPRESSION: 1.  Primarily increase in hypermetabolism of osseous metastasis. An isolated right acetabular lesion is decreased in hypermetabolism since the prior exam. 2. No evidence of hypermetabolic soft tissue metastasis. 3. Similar non FDG avid right-sided thyroid nodule, favoring a benign etiology. Recommend attention on follow-up.   08/30/2017 PET scan   08/30/2017 PET Scan  IMPRESSION: 1. Worsening osseous metastatic disease. 2. Focal hypermetabolism in the central spinal canal the L1 level, similar to the prior exam. Metastatic implant cannot be excluded. 3. Slight enlargement of a right thyroid nodule which now appears more cystic in character. Consider further evaluation with thyroid ultrasound. If patient is clinically hyperthyroid, consider nuclear medicine thyroid uptake and scan   08/30/2017 Progression   08/30/2017 PET Scan  IMPRESSION: 1. Worsening osseous metastatic disease. 2. Focal hypermetabolism in the central spinal canal the L1 level, similar to the prior exam. Metastatic implant cannot be excluded. 3. Slight enlargement of a right thyroid nodule which now appears more cystic in character. Consider further evaluation with thyroid ultrasound. If patient is clinically hyperthyroid, consider nuclear medicine thyroid uptake and scan   09/29/2017 - 10/11/2017 Radiation Therapy   Pt received 27 Gy in 9 fractions directed at the areas of significant uptake noted on recent bone scan the right pelvis and right proximal femur, with Dr, KinaSondra Come10/09/2017 - 04/07/2018 Chemotherapy   -Herceptin every 3 weeks starting 02/08/17, perjeta added on 10/20/2017  -weekly Taxol started on 10/21/2017. Will reduce Taxol to 2 weeks on, 1 week off starting on 02/04/17.  Stoped due to mild disease progression  10/27/2017 Imaging   10/27/2017 CT AP IMPRESSION: 1. No acute abnormality. No evidence of bowel obstruction or acute bowel inflammation. Normal appendix. 2. Stable patchy sclerotic osseous  metastases throughout the visualized skeleton. No new or progressive metastatic disease in the abdomen or pelvis.   01/11/2018 Imaging   Whole Body Bone Scan 01/11/18  IMPRESSION: 1. Multifocal osseous metastases as described. 2. Right scapular metastasis. 3. Right superior thoracic spinous process. 4. Right sacral ala and right L5 metastases. 5. Right acetabular metastasis. 6. Right proximal femur metastasis. 7. Diffuse metastases throughout the left femur. 8. Anterior right fourth rib metastasis.   01/11/2018 Imaging   CT CAP W Contrast 01/11/18  IMPRESSION: 1. Similar-appearing osseous metastatic disease. 2. No evidence for progressed metastatic disease in the chest, abdomen or pelvis.   03/30/2018 Imaging   Bone Scan 03/30/18  IMPRESSION: Stable multifocal bony metastatic disease.   03/30/2018 Imaging    CT CAP W Contrast 03/30/18 IMPRESSION: 1. New mild contiguous wall thickening with associated mucosal hyperenhancement throughout the left colon and rectum, suggesting nonspecific infectious or inflammatory proctocolitis. Differential includes C diff colitis. 2. Stable faint patchy sclerosis in the iliac bones and lumbar vertebral bodies. No new or progressive osseous metastatic disease by CT. Please note that the osseous lesions are relatively occult by CT and would be better evaluated by PET-CT, as clinically warranted. 3. No evidence of extra osseous metastatic disease.  These results will be called to the ordering clinician or representative by the Radiologist Assistant, and communication documented in the PACS or zVision Dashboard.   04/15/2018 - 06/16/2018 Chemotherapy   Enhertu q3weeks starting 04/15/18. Stopped Enhertu after cycle 3 on 05/27/18 due to poor toleration due to poor toleration with anorexia, weight loss, N&V, diarrhea   04/21/2018 PET scan   PET 04/21/18  IMPRESSION: 1. Overall marked interval improvement in bony hypermetabolic disease. All of the  previously identified hypermetabolic osseous metastases have decreased in hypermetabolism in the interval. There is a new tiny focus of hypermetabolism in the right aspect of the L3 vertebral body that appears to correspond to a new 8 mm lucency, raising concern for new metastatic disease. Close attention on follow-up recommended. 2. Focal hypermetabolism in the central spinal canal at the level of L1 previously has resolved. 3. No hypermetabolic soft tissue disease on today's study. 4. Right thyroid nodule seen previously has decreased in the interval.    06/16/2018 Imaging   CT LUMBAR SPINE WO CONTRAST 06/16/18 IMPRESSION: 1. Transitional lumbosacral anatomy. Correlation with radiographs is recommended prior to any operative intervention. 2. Suspicion of a right L3 vertebral body metastasis on the April PET-CT is not evident by CT. Stable degenerative versus metastatic lesion at the right L5 superior endplate. Small but increased right iliac bone metastasis since March. Lumbar MRI would best characterize lumbar metastatic disease (without contrast if necessary). 3. Suspect L2-L3 left subarticular segment disc herniation. Query left L2 radiculitis. 4. Stable appearance of rightward L4-L5 disc degeneration which could affect the right foramen and right lateral recess.   06/17/2018 - 11/16/2018 Chemotherapy   Restart Taxol 2 weeks on/1 week off with Herceptin and Perjeta q3weeks on 06/17/18. Perjeta was stopped after C5 due to diarrhea. Stopped Taxol after 11/16/18 due to disease progression to L3.    07/06/2018 PET scan   PET  IMPRESSION: 1. Near total resolution of hypermetabolic activity associated with skeletal metastasis. Very mild activity associated with the LEFT iliac bone which is similar to background blood pool activity. 2. No new   or progressive disease.   11/21/2018 PET scan   IMPRESSION: 1. New focal hypermetabolic lesion eccentric to the right in the L3 vertebral body,  maximum SUV 6.1 compatible with a new focus of active malignancy. 2. Very low-grade activity similar to blood pool in previous existing mildly sclerotic metastatic lesions including the left iliac bone lesion. Some of the mildly sclerotic lesions are not hypermetabolic compatible with effectively treated bony metastatic disease. 3. No hypermetabolic soft tissue lesions are identified.     11/30/2018 - 08/25/2019 Chemotherapy   Continue Herceptin q3weeks. Stopped 08/25/19 due to significant disease progression and switched to  IV Matuzumab   12/12/2018 - 12/17/2018 Chemotherapy   Tucatinib (TUKYSA) 6 tabs BID as a next line therapy starting 12/12/18. Stopped 12/17/18 duet o N&V, poor appetite and HA    12/21/2018 - 03/17/2019 Chemotherapy   IV Eribulin on day 1, 8 every 21 days starting 12/21/18. Stopped 03/17/19 due to mixed response to treatment.    03/21/2019 PET scan   IMPRESSION: 1. Mixed response to therapy of osseous metastasis. Although a right-sided L4 lesion demonstrates decreased hypermetabolism today, new left iliac and right ischial hypermetabolic lesions are seen. 2. No evidence of hypermetabolic soft tissue metastasis.     03/27/2019 - 07/21/2019 Chemotherapy   Neratinib (Nerlynx) titrate up from 159m to 2472mstarting 03/27/19. She could not tolerate 16026mnd stopped on 02/05/19 due to N&V and diarrhea. Will restart at 120m73m 04/10/18-04/27/19 and 4/19-4/22. Reduced to 80mg74me daily on 05/10/19. Held/stopped since 07/21/19 due to dry cough, Nausea and body aches.   06/23/2019 Imaging   CT Head  IMPRESSION: 1. No acute intracranial abnormality or metastatic disease identified. 2. Chronic white matter disease in the right hemisphere is stable from prior exams.     09/05/2019 PET scan   IMPRESSION: 1. Significant progression of widespread hypermetabolic osseous metastases throughout the axial skeleton as detailed. 2. No hypermetabolic extraosseous sites of metastatic disease.    09/15/2019 -  Chemotherapy   IV Margetuximab q3weeks and IV Gemcitabine 2 weeks on/1 week off starting 09/15/19. Carboplatin added with Gemcitabine starting with C3 on 11/03/19      CURRENT THERAPY:  -Zometa q3mont36monthMargetuximabq3weeks and IV Gemcitabine2 weeks on/1 week offstarting 09/15/19. Carboplatin added to Gemcitabine starting with C3 on 11/03/19  INTERVAL HISTORY:  [name] Debra BOYDENre for a follow up. She presents to the clinic with her interpretor. She notes she tolerated last cycle well. She had fatigue and able to recover well. She notes her stable manageable pain in her leg on oxycodone once at nighttime. She is on Potassium still. She notes change in copay for her ambien.    REVIEW OF SYSTEMS:   Constitutional: Denies fevers, chills or abnormal weight loss Eyes: Denies blurriness of vision Ears, nose, mouth, throat, and face: Denies mucositis or sore throat Respiratory: Denies cough, dyspnea or wheezes Cardiovascular: Denies palpitation, chest discomfort or lower extremity swelling Gastrointestinal:  Denies nausea, heartburn or change in bowel habits Skin: Denies abnormal skin rashes Lymphatics: Denies new lymphadenopathy or easy bruising Neurological:Denies numbness, tingling or new weaknesses Behavioral/Psych: Mood is stable, no new changes  All other systems were reviewed with the patient and are negative.  MEDICAL HISTORY:  Past Medical History:  Diagnosis Date  . Abnormal Pap smear   . Anxiety   . [place] [facility] [place]   breast-s/p mastectomy, chemo, rad  . CIN I (cervical intraepithelial neoplasia I) 2003   by  [facility]  . Congenital deafness   . Deaf   . Depression   . Port-A-Cath in place 2017   for chemotherapy  . Radiation 07/12/15-07/26/15   left femur 30 Gy  . S/P bilateral oophorectomy     SURGICAL HISTORY: Past Surgical History:  Procedure Laterality Date  . ABDOMINAL HYSTERECTOMY    . INTRAMEDULLARY (IM) NAIL  INTERTROCHANTERIC Left 06/26/2015   Procedure: LEFT BIOMET LONG AFFIXS NAIL;  Surgeon: [name], MD;  Location: [facility];  Service: Orthopedics;  Laterality: Left;  . IR GENERIC HISTORICAL  08/06/2015   IR US GUIDE VASC ACCESS LEFT 08/06/2015 Aletta Edouard, MD [facility]  . IR GENERIC HISTORICAL  08/06/2015   IR FLUORO GUIDE CV LINE LEFT 08/06/2015 Aletta Edouard, MD [facility]  . IR GENERIC HISTORICAL  03/05/2016   IR CV LINE INJECTION 03/05/2016 Markus Daft, MD [facility]  . LEFT HEART CATH AND CORONARY ANGIOGRAPHY N/A 12/13/2017   Procedure: LEFT HEART CATH AND CORONARY ANGIOGRAPHY;  Surgeon: Leonie Man, MD;  Location: [facility];  Service: Cardiovascular;  Laterality: N/A;  . MASTECTOMY     right breast  . MASTECTOMY    . OVARIAN CYST REMOVAL    . RADIOLOGY WITH ANESTHESIA Left 06/25/2015   Procedure: MRI OF LEFT HIP WITH OR WITHOUT CONTRAST;  Surgeon: Medication Radiologist, MD;  Location: [facility];  Service: Radiology;  Laterality: Left;  DR. MCINTYRE/MRI  . TUBAL LIGATION      I have reviewed the social history and family history with the patient and they are unchanged from previous note.  ALLERGIES:  is allergic to promethazine hcl, chlorhexidine, and diphenhydramine hcl.  MEDICATIONS:  Current Outpatient Medications  Medication Sig Dispense Refill  . ALPRAZolam (XANAX) 0.25 MG tablet Take 1 tablet (0.25 mg total) by mouth 2 (two) times daily as needed for anxiety. 30 tablet 0  . diphenoxylate-atropine (LOMOTIL) 2.5-0.025 MG tablet Take 1-2 tablets by mouth 4 (four) times daily as needed for diarrhea or loose stools. 60 tablet 1  . guaiFENesin-codeine 100-10 MG/5ML syrup Take 5 mLs by mouth every 4 (four) hours as needed for cough. 180 mL 0  . lidocaine-prilocaine (EMLA) cream Apply 1 application topically as needed. Apply to Porta-Cath 1-2 hours prior to access as directed. 90 g 2  . ondansetron (ZOFRAN) 8 MG tablet Take 1 tablet (8 mg total) by mouth every 8 (eight)  hours as needed for nausea or vomiting. 45 tablet 1  . oxyCODONE (ROXICODONE) 5 MG immediate release tablet Take 1 tablet (5 mg total) by mouth every 8 (eight) hours as needed for severe pain. 60 tablet 0  . potassium chloride (KLOR-CON) 10 MEQ tablet Take 3 tablets (30 mEq total) by mouth daily. 90 tablet 2  . prochlorperazine (COMPAZINE) 10 MG tablet Take 1 tablet (10 mg total) by mouth 2 (two) times daily as needed for nausea or vomiting. 10 tablet 0  . zolpidem (AMBIEN) 10 MG tablet Take 1 tablet (10 mg total) by mouth at bedtime as needed for sleep. 15 tablet 2   No current facility-administered medications for this visit.   Facility-Administered Medications Ordered in Other Visits  Medication Dose Route Frequency Provider Last Rate Last Admin  . potassium chloride SA (K-DUR) CR tablet 40 mEq  40 mEq Oral Once Truitt Merle, MD      . sodium chloride 0.9 % 1,000 mL with potassium chloride 10 mEq infusion   Intravenous Continuous Livesay, Tamala Julian, MD   Stopped at  09/10/15 1653  . sodium chloride 0.9 % injection 10 mL  10 mL Intravenous PRN Livesay, Lennis P, MD        PHYSICAL EXAMINATION: ECOG PERFORMANCE STATUS: 1 - Symptomatic but completely ambulatory  There were no vitals filed for this visit. There were no vitals filed for this visit.  Due to [place] we will limit examination to appearance. Patient had no complaints.  GENERAL:alert, no distress and comfortable SKIN: skin color normal, no rashes or significant lesions EYES: normal, Conjunctiva are pink and non-injected, sclera clear  NEURO: alert & oriented x 3 with fluent speech   LABORATORY DATA:  I have reviewed the data as listed CBC Latest Ref Rng & Units 11/24/2019 11/10/2019 11/03/2019  WBC 4.0 - 10.5 K/uL 5.7 2.2(L) 5.5  Hemoglobin 12.0 - 15.0 g/dL 9.1(L) 8.6(L) 9.5(L)  Hematocrit 36 - 46 % 27.3(L) 25.6(L) 28.3(L)  Platelets 150 - 400 K/uL 283 159 256     CMP Latest Ref Rng & Units 11/24/2019 11/10/2019 11/03/2019   Glucose 70 - 99 mg/dL 86 88 87  BUN 6 - 20 mg/dL _0 Creatinine 0.44 - 1.00 mg/dL 0.88 0.84 0.82  Sodium 135 - 145 mmol/L 142 140 142  Potassium 3.5 - 5.1 mmol/L 3.4(L) 3.6 3.3(L)  Chloride 98 - 111 mmol/L 108 108 109  CO2 22 - 32 mmol/L _1 Calcium 8.9 - 10.3 mg/dL 8.9 8.8(L) 9.0  Total Protein 6.5 - 8.1 g/dL 6.4(L) 6.1(L) 6.1(L)  Total Bilirubin 0.3 - 1.2 mg/dL 1.4(H) 1.6(H) 1.3(H)  Alkaline Phos 38 - 126 U/L 87 70 102  AST 15 - 41 U/L _2 ALT 0 - 44 U/L 18 39 14      RADIOGRAPHIC STUDIES: I have personally reviewed the radiological images as listed and agreed with the findings in the report. No results found.   ASSESSMENT & PLAN:  [name] is a 54 y.o. 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 She had right mastectomy, 4 cycles of AC-T and 5 years of Tamoxifen/AI.  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 -After her first line ofchemotreatment and initial maintenance therapy, she progressed or had poor toleration of multiple lines of treatment since then. Latest in 08/2019. -Ipreviouslydiscussed after multiple treatments tried, her remaining options are fewer. I recommendednext-line (7th line)with anti-HER2IV Margetuximabq3weekswithIV Chemo weeklyGemcitabine2 weeks on/1 week off.She started on9/3/21. -S/p C2 she has developed worsening chest pain and her tumor marker has been increasing trending up, which is concerning for cancer progression. I added carboplatin AUC 1 starting with C3.   -S/p C3 she continues to tolerate treatment well with mild fatigue. No recurrent severe chest pain. Labs reviewed, Hg 9.1, K 3.4, protein 6.4. CA 27.29 still pending. Overall adequate to proceed with C4D1 Gemcitabine and Carboplatin and Margetuximab today. Proceed with day 8 Gemcitabine and Carboplatin next week.   -Will give Onpro next week  -F/u in 3 weeks with next PET scan   2.Body pain, secondary to bone mets, Hypocalcemia, leg cramps  -She is being treated with Zometa q67month. Next injection in 01/2020 -She will continue oral calcium and Vit Dand Prilosec for Acid Reflux. -Current pain includes, lower back, upper legs, hips and rightshoulder.PET8/24/21does indicate disease progression in her spine, pelvis and right scapula. -Pain is well controlled on 547mOxycodone1 tab nightly.  3.Congenital deafness  4.Mild anemia, secondary to chemotherapy and malignancy -Moderate and Stable   5.Hypokalemia, Hypomagnesium, secondary toDiarrhea -She developed diarrhea s/p cycle 3 Perjetaand again with Nerlynx, Both have been D/c. -She is onOral potassium 28mqBID.She stopped oral Mag because it made her nauseous.  -She can take more potassium when she has diarrhea.  6.Goal of care discussion, DNR/DNI -Wepreviouslyreviewed goal of care andI recommend DNR/DNI, sheagreed.  -I encouraged her to start setting up her living will.We previously discussed hospice care if she has no more treatment available or could not tolerate more treatment  7.Insomnia -She cannot take Benadryl due to allergy reaction. She has tried trazodone with no relief.  -She is on Xanax and Ambien566mabs nightly. I prescribe at 1021mbien(take half tablet) given insurance.She knows not to take both at the same time.   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 Stable  9. Mildhyperbilirubinemia  -likely second to chemotherapy, fluctuates  -Will monitor closely. Stable.   PLAN:  -Labs reviewed and adequate to proceed with C4D1 Gemcitabine and Carboplatin and Margetuximab today.  -Proceed with lab, flush and day 8 Gemcitabine and Carboplatin next week.  -2nd COVID vaccine on 12/11/19 -Lab, flush, Gemcitabine and Carboplatin in 1, 3, 4 weeks  -F/u and Margetuximab in 3 weeks  -PET scan on  12/11/19    No problem-specific Assessment & Plan notes found for this encounter.   Orders Placed This Encounter  Procedures  . NM PET Image Restag (PS) Skull Base To Thigh    Standing Status:   Future    Standing Expiration Date:   11/23/2020    Order Specific Question:   If indicated for the ordered procedure, I authorize the administration of a radiopharmaceutical per Radiology protocol    Answer:   Yes    Order Specific Question:   Is the patient pregnant?    Answer:   No    Order Specific Question:   Preferred [facility]?    [facility]. The patient knows to call the clinic with any problems, questions or concerns. No barriers to learning was detected. The total time spent in the appointment was 30 minutes.     YanTruitt MerleD 11/24/2019   I, AmoJoslyn Devonm acting as scribe for YanTruitt MerleD.   I have reviewed the above documentation for accuracy and completeness, and I agree with the above.

## 2019-11-24 ENCOUNTER — Ambulatory Visit: Payer: Medicaid Other

## 2019-11-24 ENCOUNTER — Inpatient Hospital Stay (HOSPITAL_BASED_OUTPATIENT_CLINIC_OR_DEPARTMENT_OTHER): Payer: Medicaid Other | Admitting: Hematology

## 2019-11-24 ENCOUNTER — Inpatient Hospital Stay: Payer: Medicaid Other

## 2019-11-24 ENCOUNTER — Telehealth: Payer: Self-pay | Admitting: Hematology

## 2019-11-24 ENCOUNTER — Other Ambulatory Visit: Payer: Self-pay

## 2019-11-24 ENCOUNTER — Encounter: Payer: Self-pay | Admitting: Hematology

## 2019-11-24 VITALS — BP 113/62 | HR 65 | Temp 97.8°F | Resp 20

## 2019-11-24 DIAGNOSIS — C50911 Malignant neoplasm of unspecified site of right female breast: Secondary | ICD-10-CM

## 2019-11-24 DIAGNOSIS — C7951 Secondary malignant neoplasm of bone: Secondary | ICD-10-CM

## 2019-11-24 DIAGNOSIS — Z95828 Presence of other vascular implants and grafts: Secondary | ICD-10-CM

## 2019-11-24 DIAGNOSIS — Z5112 Encounter for antineoplastic immunotherapy: Secondary | ICD-10-CM | POA: Diagnosis not present

## 2019-11-24 LAB — CBC WITH DIFFERENTIAL (CANCER CENTER ONLY)
Abs Immature Granulocytes: 0.13 10*3/uL — ABNORMAL HIGH (ref 0.00–0.07)
Basophils Absolute: 0 10*3/uL (ref 0.0–0.1)
Basophils Relative: 0 %
Eosinophils Absolute: 0.1 10*3/uL (ref 0.0–0.5)
Eosinophils Relative: 1 %
HCT: 27.3 % — ABNORMAL LOW (ref 36.0–46.0)
Hemoglobin: 9.1 g/dL — ABNORMAL LOW (ref 12.0–15.0)
Immature Granulocytes: 2 %
Lymphocytes Relative: 18 %
Lymphs Abs: 1 10*3/uL (ref 0.7–4.0)
MCH: 32.6 pg (ref 26.0–34.0)
MCHC: 33.3 g/dL (ref 30.0–36.0)
MCV: 97.8 fL (ref 80.0–100.0)
Monocytes Absolute: 0.5 10*3/uL (ref 0.1–1.0)
Monocytes Relative: 9 %
Neutro Abs: 4 10*3/uL (ref 1.7–7.7)
Neutrophils Relative %: 70 %
Platelet Count: 283 10*3/uL (ref 150–400)
RBC: 2.79 MIL/uL — ABNORMAL LOW (ref 3.87–5.11)
RDW: 17.6 % — ABNORMAL HIGH (ref 11.5–15.5)
WBC Count: 5.7 10*3/uL (ref 4.0–10.5)
nRBC: 0 % (ref 0.0–0.2)

## 2019-11-24 LAB — CMP (CANCER CENTER ONLY)
ALT: 18 U/L (ref 0–44)
AST: 17 U/L (ref 15–41)
Albumin: 3.8 g/dL (ref 3.5–5.0)
Alkaline Phosphatase: 87 U/L (ref 38–126)
Anion gap: 9 (ref 5–15)
BUN: 11 mg/dL (ref 6–20)
CO2: 25 mmol/L (ref 22–32)
Calcium: 8.9 mg/dL (ref 8.9–10.3)
Chloride: 108 mmol/L (ref 98–111)
Creatinine: 0.88 mg/dL (ref 0.44–1.00)
GFR, Estimated: >60 mL/min (ref >60)
Glucose, Bld: 86 mg/dL (ref 70–99)
Potassium: 3.4 mmol/L — ABNORMAL LOW (ref 3.5–5.1)
Sodium: 142 mmol/L (ref 135–145)
Total Bilirubin: 1.4 mg/dL — ABNORMAL HIGH (ref 0.3–1.2)
Total Protein: 6.4 g/dL — ABNORMAL LOW (ref 6.5–8.1)

## 2019-11-24 MED ORDER — ACETAMINOPHEN 325 MG PO TABS
ORAL_TABLET | ORAL | Status: AC
  Filled 2019-11-24: qty 2

## 2019-11-24 MED ORDER — HEPARIN SOD (PORK) LOCK FLUSH 100 UNIT/ML IV SOLN
500.0000 [IU] | Freq: Once | INTRAVENOUS | Status: AC | PRN
  Administered 2019-11-24: 500 [IU]
  Filled 2019-11-24: qty 5

## 2019-11-24 MED ORDER — SODIUM CHLORIDE 0.9 % IV SOLN
Freq: Once | INTRAVENOUS | Status: AC
  Filled 2019-11-24: qty 250

## 2019-11-24 MED ORDER — SODIUM CHLORIDE 0.9 % IV SOLN
800.0000 mg/m2 | Freq: Once | INTRAVENOUS | Status: AC
  Administered 2019-11-24: 1330 mg via INTRAVENOUS
  Filled 2019-11-24: qty 34.98

## 2019-11-24 MED ORDER — SODIUM CHLORIDE 0.9% FLUSH
10.0000 mL | Freq: Once | INTRAVENOUS | Status: AC | PRN
  Administered 2019-11-24: 10 mL
  Filled 2019-11-24: qty 10

## 2019-11-24 MED ORDER — LORATADINE 10 MG PO TABS
ORAL_TABLET | ORAL | Status: AC
  Filled 2019-11-24: qty 1

## 2019-11-24 MED ORDER — SODIUM CHLORIDE 0.9% FLUSH
10.0000 mL | INTRAVENOUS | Status: DC | PRN
  Administered 2019-11-24: 10 mL
  Filled 2019-11-24: qty 10

## 2019-11-24 MED ORDER — LORATADINE 10 MG PO TABS
10.0000 mg | ORAL_TABLET | Freq: Once | ORAL | Status: AC
  Administered 2019-11-24: 10 mg via ORAL

## 2019-11-24 MED ORDER — PALONOSETRON HCL INJECTION 0.25 MG/5ML
INTRAVENOUS | Status: AC
  Filled 2019-11-24: qty 5

## 2019-11-24 MED ORDER — SODIUM CHLORIDE 0.9 % IV SOLN
100.0000 mg | Freq: Once | INTRAVENOUS | Status: AC
  Administered 2019-11-24: 100 mg via INTRAVENOUS
  Filled 2019-11-24: qty 10

## 2019-11-24 MED ORDER — ACETAMINOPHEN 325 MG PO TABS
650.0000 mg | ORAL_TABLET | Freq: Once | ORAL | Status: AC
  Administered 2019-11-24: 650 mg via ORAL

## 2019-11-24 MED ORDER — PALONOSETRON HCL INJECTION 0.25 MG/5ML
0.2500 mg | Freq: Once | INTRAVENOUS | Status: AC
  Administered 2019-11-24: 0.25 mg via INTRAVENOUS

## 2019-11-24 MED ORDER — SODIUM CHLORIDE 0.9 % IV SOLN
10.0000 mg | Freq: Once | INTRAVENOUS | Status: AC
  Administered 2019-11-24: 10 mg via INTRAVENOUS
  Filled 2019-11-24: qty 10

## 2019-11-24 MED ORDER — SODIUM CHLORIDE 0.9 % IV SOLN
15.0000 mg/kg | Freq: Once | INTRAVENOUS | Status: AC
  Administered 2019-11-24: 875 mg via INTRAVENOUS
  Filled 2019-11-24: qty 35

## 2019-11-24 NOTE — Patient Instructions (Signed)
[facility] Discharge Instructions for Patients Receiving Chemotherapy  Today you received the following chemotherapy agents: Margenza, Gemzar, Carboplatin   To help prevent nausea and vomiting after your treatment, we encourage you to take your nausea medication as directed.    If you develop nausea and vomiting that is not controlled by your nausea medication, call the clinic.   BELOW ARE SYMPTOMS THAT SHOULD BE REPORTED IMMEDIATELY:  *FEVER GREATER THAN 100.5 F  *CHILLS WITH OR WITHOUT FEVER  NAUSEA AND VOMITING THAT IS NOT CONTROLLED WITH YOUR NAUSEA MEDICATION  *UNUSUAL SHORTNESS OF BREATH  *UNUSUAL BRUISING OR BLEEDING  TENDERNESS IN MOUTH AND THROAT WITH OR WITHOUT PRESENCE OF ULCERS  *URINARY PROBLEMS  *BOWEL PROBLEMS  UNUSUAL RASH Items with * indicate a potential emergency and should be followed up as soon as possible.  Feel free to call the clinic should you have any questions or concerns. The clinic phone number is [phone].  Please show the [place] at check-in to the Emergency Department and triage nurse.

## 2019-11-24 NOTE — Patient Instructions (Signed)

## 2019-11-24 NOTE — Telephone Encounter (Signed)
Scheduled per 11/12 los. Noted to give pt appt calendar.

## 2019-11-25 LAB — CANCER ANTIGEN 27.29: CA 27.29: 81.6 U/mL — ABNORMAL HIGH (ref 0.0–38.6)

## 2019-12-01 ENCOUNTER — Other Ambulatory Visit: Payer: Self-pay

## 2019-12-01 ENCOUNTER — Ambulatory Visit: Payer: Medicaid Other

## 2019-12-01 ENCOUNTER — Other Ambulatory Visit: Payer: Self-pay | Admitting: Hematology

## 2019-12-01 ENCOUNTER — Inpatient Hospital Stay: Payer: Medicaid Other

## 2019-12-01 VITALS — BP 107/62 | HR 81 | Temp 97.9°F | Resp 18

## 2019-12-01 DIAGNOSIS — C50911 Malignant neoplasm of unspecified site of right female breast: Secondary | ICD-10-CM

## 2019-12-01 DIAGNOSIS — C7951 Secondary malignant neoplasm of bone: Secondary | ICD-10-CM

## 2019-12-01 DIAGNOSIS — Z5112 Encounter for antineoplastic immunotherapy: Secondary | ICD-10-CM | POA: Diagnosis not present

## 2019-12-01 LAB — MAGNESIUM: Magnesium: 1.5 mg/dL — ABNORMAL LOW (ref 1.7–2.4)

## 2019-12-01 LAB — CMP (CANCER CENTER ONLY)
ALT: 34 U/L (ref 0–44)
AST: 23 U/L (ref 15–41)
Albumin: 3.8 g/dL (ref 3.5–5.0)
Alkaline Phosphatase: 73 U/L (ref 38–126)
Anion gap: 12 (ref 5–15)
BUN: 14 mg/dL (ref 6–20)
CO2: 23 mmol/L (ref 22–32)
Calcium: 9.4 mg/dL (ref 8.9–10.3)
Chloride: 106 mmol/L (ref 98–111)
Creatinine: 0.99 mg/dL (ref 0.44–1.00)
GFR, Estimated: >60 mL/min (ref >60)
Glucose, Bld: 98 mg/dL (ref 70–99)
Potassium: 3.4 mmol/L — ABNORMAL LOW (ref 3.5–5.1)
Sodium: 141 mmol/L (ref 135–145)
Total Bilirubin: 1.7 mg/dL — ABNORMAL HIGH (ref 0.3–1.2)
Total Protein: 6.6 g/dL (ref 6.5–8.1)

## 2019-12-01 LAB — CBC WITH DIFFERENTIAL (CANCER CENTER ONLY)
Abs Immature Granulocytes: 0.02 10*3/uL (ref 0.00–0.07)
Basophils Absolute: 0 10*3/uL (ref 0.0–0.1)
Basophils Relative: 1 %
Eosinophils Absolute: 0 10*3/uL (ref 0.0–0.5)
Eosinophils Relative: 1 %
HCT: 26.3 % — ABNORMAL LOW (ref 36.0–46.0)
Hemoglobin: 9 g/dL — ABNORMAL LOW (ref 12.0–15.0)
Immature Granulocytes: 1 %
Lymphocytes Relative: 26 %
Lymphs Abs: 0.8 10*3/uL (ref 0.7–4.0)
MCH: 33 pg (ref 26.0–34.0)
MCHC: 34.2 g/dL (ref 30.0–36.0)
MCV: 96.3 fL (ref 80.0–100.0)
Monocytes Absolute: 0.3 10*3/uL (ref 0.1–1.0)
Monocytes Relative: 8 %
Neutro Abs: 2 10*3/uL (ref 1.7–7.7)
Neutrophils Relative %: 63 %
Platelet Count: 217 10*3/uL (ref 150–400)
RBC: 2.73 MIL/uL — ABNORMAL LOW (ref 3.87–5.11)
RDW: 15.6 % — ABNORMAL HIGH (ref 11.5–15.5)
WBC Count: 3.1 10*3/uL — ABNORMAL LOW (ref 4.0–10.5)
nRBC: 0 % (ref 0.0–0.2)

## 2019-12-01 MED ORDER — MAGNESIUM SULFATE 2 GM/50ML IV SOLN
INTRAVENOUS | Status: AC
  Filled 2019-12-01: qty 50

## 2019-12-01 MED ORDER — OXYCODONE HCL 5 MG PO TABS: 5.0000 mg | ORAL_TABLET | Freq: Three times a day (TID) | ORAL | 0 refills | Status: DC | PRN

## 2019-12-01 MED ORDER — PEGFILGRASTIM 6 MG/0.6ML ~~LOC~~ PSKT
PREFILLED_SYRINGE | SUBCUTANEOUS | Status: AC
  Filled 2019-12-01: qty 0.6

## 2019-12-01 MED ORDER — SODIUM CHLORIDE 0.9% FLUSH
10.0000 mL | INTRAVENOUS | Status: DC | PRN
  Administered 2019-12-01: 10 mL
  Filled 2019-12-01: qty 10

## 2019-12-01 MED ORDER — PEGFILGRASTIM 6 MG/0.6ML ~~LOC~~ PSKT: 6.0000 mg | PREFILLED_SYRINGE | Freq: Once | SUBCUTANEOUS | Status: DC

## 2019-12-01 MED ORDER — SODIUM CHLORIDE 0.9 % IV SOLN
Freq: Once | INTRAVENOUS | Status: AC
  Filled 2019-12-01: qty 250

## 2019-12-01 MED ORDER — PROCHLORPERAZINE MALEATE 10 MG PO TABS
10.0000 mg | ORAL_TABLET | Freq: Once | ORAL | Status: AC
  Administered 2019-12-01: 10 mg via ORAL

## 2019-12-01 MED ORDER — MAGNESIUM SULFATE 2 GM/50ML IV SOLN
2.0000 g | Freq: Once | INTRAVENOUS | Status: AC
  Administered 2019-12-01: 2 g via INTRAVENOUS

## 2019-12-01 MED ORDER — HEPARIN SOD (PORK) LOCK FLUSH 100 UNIT/ML IV SOLN
500.0000 [IU] | Freq: Once | INTRAVENOUS | Status: AC | PRN
  Administered 2019-12-01: 500 [IU]
  Filled 2019-12-01: qty 5

## 2019-12-01 MED ORDER — PROCHLORPERAZINE MALEATE 10 MG PO TABS
ORAL_TABLET | ORAL | Status: AC
  Filled 2019-12-01: qty 1

## 2019-12-01 MED ORDER — SODIUM CHLORIDE 0.9 % IV SOLN
100.0000 mg | Freq: Once | INTRAVENOUS | Status: AC
  Administered 2019-12-01: 100 mg via INTRAVENOUS
  Filled 2019-12-01: qty 10

## 2019-12-01 MED ORDER — SODIUM CHLORIDE 0.9 % IV SOLN
800.0000 mg/m2 | Freq: Once | INTRAVENOUS | Status: AC
  Administered 2019-12-01: 1330 mg via INTRAVENOUS
  Filled 2019-12-01: qty 34.98

## 2019-12-01 NOTE — Patient Instructions (Signed)

## 2019-12-01 NOTE — Progress Notes (Signed)
Ok to treat today with all abnormal labs reviewed by MD Burr Medico.

## 2019-12-01 NOTE — Progress Notes (Signed)
Confirmed w/ Dr. Burr Medico - ok to keep Carbo dose at 100 mg today.  Kennith Center, Pharm.D., CPP 12/01/2019@9 :59 AM

## 2019-12-01 NOTE — Patient Instructions (Signed)
[facility] Discharge Instructions for Patients Receiving Chemotherapy  Today you received the following chemotherapy agents: Gemzar/Carboplatin.  To help prevent nausea and vomiting after your treatment, we encourage you to take your nausea medication as directed.   If you develop nausea and vomiting that is not controlled by your nausea medication, call the clinic.   BELOW ARE SYMPTOMS THAT SHOULD BE REPORTED IMMEDIATELY:  *FEVER GREATER THAN 100.5 F  *CHILLS WITH OR WITHOUT FEVER  NAUSEA AND VOMITING THAT IS NOT CONTROLLED WITH YOUR NAUSEA MEDICATION  *UNUSUAL SHORTNESS OF BREATH  *UNUSUAL BRUISING OR BLEEDING  TENDERNESS IN MOUTH AND THROAT WITH OR WITHOUT PRESENCE OF ULCERS  *URINARY PROBLEMS  *BOWEL PROBLEMS  UNUSUAL RASH Items with * indicate a potential emergency and should be followed up as soon as possible.  Feel free to call the clinic should you have any questions or concerns. The clinic phone number is [phone].  Please show the CHEMO ALERT CARD at check-in to the Emergency Department and triage nurse.   

## 2019-12-01 NOTE — Progress Notes (Signed)
Mg = 1.5 today.  Per Dr. Burr Medico, ok for Mg Sulf 2 g IV over 1 hour.  Kennith Center, Pharm.D., CPP 12/01/2019@10 :02 AM

## 2019-12-11 ENCOUNTER — Other Ambulatory Visit: Payer: Self-pay

## 2019-12-11 ENCOUNTER — Inpatient Hospital Stay: Payer: Medicaid Other

## 2019-12-11 DIAGNOSIS — Z23 Encounter for immunization: Secondary | ICD-10-CM

## 2019-12-11 DIAGNOSIS — Z5112 Encounter for antineoplastic immunotherapy: Secondary | ICD-10-CM | POA: Diagnosis not present

## 2019-12-12 NOTE — Progress Notes (Signed)
° °  Covid-19 Vaccination Clinic  Name:  [name]    MRN: [id number] DOB: 1965-04-19  12/12/2019  [name] was observed post Covid-19 immunization for 15 minutes without incident. She was provided with Vaccine Information Sheet and instruction to access the V-Safe system.   [name] was instructed to call 911 with any severe reactions post vaccine:  Difficulty breathing   Swelling of face and throat   A fast heartbeat   A bad rash all over body   Dizziness and weakness   Immunizations Administered    Name Date Dose VIS Date Route   Pfizer COVID-19 Vaccine 12/11/2019 11:14 AM 0.3 mL 11/01/2019 Intramuscular   Manufacturer: [place]   Lot: [id number]   [place]: [address]-4

## 2019-12-13 ENCOUNTER — Ambulatory Visit (HOSPITAL_COMMUNITY)
Admission: RE | Admit: 2019-12-13 | Discharge: 2019-12-13 | Disposition: A | Discharge status: Home / Self Care | Payer: Medicaid Other | Source: Ambulatory Visit | Attending: Hematology | Admitting: Hematology

## 2019-12-13 ENCOUNTER — Other Ambulatory Visit: Payer: Self-pay

## 2019-12-13 DIAGNOSIS — D7389 Other diseases of spleen: Secondary | ICD-10-CM | POA: Diagnosis not present

## 2019-12-13 DIAGNOSIS — C50911 Malignant neoplasm of unspecified site of right female breast: Secondary | ICD-10-CM | POA: Insufficient documentation

## 2019-12-13 DIAGNOSIS — C7951 Secondary malignant neoplasm of bone: Secondary | ICD-10-CM | POA: Insufficient documentation

## 2019-12-13 DIAGNOSIS — D71 Functional disorders of polymorphonuclear neutrophils: Secondary | ICD-10-CM | POA: Diagnosis not present

## 2019-12-13 LAB — GLUCOSE, CAPILLARY: Glucose-Capillary: 78 mg/dL (ref 70–99)

## 2019-12-13 MED ORDER — FLUDEOXYGLUCOSE F - 18 (FDG) INJECTION
6.5900 | Freq: Once | INTRAVENOUS | Status: AC
  Administered 2019-12-13: 6.59 via INTRAVENOUS

## 2019-12-13 NOTE — Progress Notes (Signed)
[name]   [phone] [phone]   Clinic Follow up Note   Patient Care Team: [name], [name], [name] as PCP - General (Physician Assistant) [name], MD as Consulting Physician (Orthopedic Surgery)  Date of Service:  12/15/2019  CHIEF COMPLAINT: f/u of metastatic breast cancer  SUMMARY OF ONCOLOGIC HISTORY: Oncology History Overview Note   Cancer Staging No matching staging information was found for the patient.     [name], right ([place])  11/1997 Cancer Diagnosis   Patient had 1.4 cm poorly differentiated right breast carcinoma diagnosed in Nov 1999 at age 2, 1/7 axillary nodes involved, ER/PR and HER 2 positive. She had mastectomy with the 7 axillary node evaluation, 4 cycles of adriamycin/ cytoxan followed by taxotere, then five years of tamoxifen thru June 2005 (no herceptin in 1999). She had bilateral oophorectomy in May 2005. She was briefly on aromatase inhibitor after tamoxifen, but discontinued this herself due to poor tolerance.   04/04/2015 Genetic Testing   Negative genetic testing on the breast/ovarian cancer pnael.  The Breast/Ovarian gene panel offered by [facility] includes sequencing and rearrangement analysis for the following 20 genes:  ATM, BARD1, BRCA1, BRCA2, BRIP1, CDH1, CHEK2, EPCAM, FANCC, MLH1, MSH2, MSH6, NBN, PALB2, PMS2, PTEN, RAD51C, RAD51D, TP53, and XRCC2.   06/26/2015 Pathology Results   Initial Path Report Bone, curettage, Left femur met, intramedullary subtroch tissue - METASTATIC ADENOCARCINOMA.  Estrogen Receptor: 95%, POSITIVE, STRONG STAINING INTENSITY Progesterone Receptor: 2%, POSITIVE, STRONG  HER2 (+), IHC 3+   06/26/2015 Surgery   Biopsy of metastatic tissue and stabilization with Affixus trochanteric nail, proximal and distal interlock of left femur by Dr. [name]    06/28/2015 Imaging   CT Chest w/ Contrast 1. Extensive right internal mammary chain  lymphadenopathy consistent with metastatic breast cancer. 2. Lytic metastases involving the posterior elements at T1 with probable nondisplaced pathologic fracture of the spinous process. 3. No other evidence of thoracic metastatic disease. 4. Trace bilateral pleural effusions with associated bibasilar atelectasis. 5. Indeterminate right thyroid nodule, not previously imaged. This could be evaluated with thyroid ultrasound as clinically warranted.   06/29/2015 -  Chemotherapy   Zometa started monthly on 06/29/15 and switched to q65months from 01/08/17   07/01/2015 Imaging   Bone Scan 1. Increased activity noted throughout the left femur. Although metastatic disease cannot be excluded, these changes are most likely secondary to prior surgery. 2. No other focal abnormalities identified to suggest metastatic disease.   07/12/2015 - 07/26/2015 Radiation Therapy   Left femur, 30 Gy in 10 fractions by Dr. Sondra Come    07/14/2015 Initial Diagnosis   [name], right ([place])   07/19/2015 Imaging   Initial PET Scan 1. Severe multifocal osseous metastatic disease, much greater than anticipated based on prior imaging. 2. Multifocal nodal metastases involving the right internal mammary, prevascular and subpectoral lymph nodes. No axillary pulmonary involvement identified. 3. No distant extra osseous metastases.   07/30/2015 - 11/29/2015 Chemotherapy   Docetaxel, Herceptin and Perjeta every 3 weeks X6 cycles, pt tolerated moderately well, restaging scan showed excellent response    09/18/2015 Imaging   CT Head w/wo Contrast 1. No acute intracranial abnormality or significant interval change. 2. Stable minimal periventricular white matter hypoattenuation on the right. This may reflect a remote ischemic injury. 3. No evidence for metastatic disease to the brain. 4. No focal soft tissue lesion to explain the patient's tenderness.   10/30/2015 Imaging  CT Abdomen Pelvis w/  Contrast 1. Few mildly prominent fluid-filled loops of small bowel scattered throughout the abdomen, with intraluminal fluid density within the distal colon. Given the provided history, findings are suggestive of acute enteritis/diarrheal illness. 2. No other acute intra-abdominal or pelvic process identified. 3. Widespread osseous metastatic disease, better evaluated on most recent PET-CT from 07/19/2015. No pathologic fracture or other complication. 4. 11 mm hypodensity within the left kidney. This lesion measures intermediate density, and is indeterminate. While this lesion is similar in size relative to recent studies, this is increased in size relative to prior study from 2012. Further evaluation with dedicated renal mass protocol CT and/or MRI is recommended for complete characterization.   12/2015 - 05/14/2016 Chemotherapy   Maintenance Herceptin and pejeta every 3 weeks stopped on 05/14/16 due to disease progression   12/25/2015 Imaging   Restaging PET Scan 1. Markedly improved skeletal activity, with only several faint foci of residual accentuated metabolic activity, but resolution of the vast majority of the previously extensive osseous metastatic disease. 2. Resolution of the prior right internal mammary and right prevascular lymph nodes. 3. Residual subcutaneous edema and edema along fascia planes in the left upper thigh. This remains somewhat more than I would expect for placement of an IM nail 6 weeks ago. If there is leg swelling further down, consider Doppler venous ultrasound to rule out left lower extremity DVT. I do not perceive an obvious difference in density between the pelvic and common femoral veins on the noncontrast CT data. 4. Chronic right maxillary and right sphenoid sinusitis.   01/16/2016 - 10/20/2017 Anti-estrogen oral therapy   Letrozole 2.5 mg daily. She was switched to exemestane due to disease progression on 02/08/17. Stopped on 10/20/2017 when treatment changed to chemo     03/01/2016 Miscellaneous   Patient presented to ED following a fall; she reports she landed on her back and is now experiencing back pain. The patient was evaluated and discharge home same day with pain control.   05/01/2016 Imaging   CT CAP IMPRESSION: 1. Stable exam.  No new or progressive disease identified. 2. No evidence for residual or recurrent adenopathy within the chest. 3. Stable bone metastasis.   05/01/2016 Imaging   BONE SCAN IMPRESSION: 1. Small focal uptake involving the anterior rib ends of the left second and right fourth ribs, new since the prior bone scan. The location of this uptake is more suggestive of a traumatic or inflammatory etiology as opposed to metastatic disease. No other evidence of metastatic disease. 2. There are other areas of stable uptake which are most likely degenerative/reactive, including right shoulder and cervical spine uptake and uptake along the right femur adjacent to the ORIF hardware.   05/20/2016 Imaging   NM PET Skull to Thigh IMPRESSION: 1. There multiple metastatic foci in the bony pelvis, including some new abnormal foci compare to the prior PET-CT, compatible with mildly progressive bony metastatic disease. Also, a left T11 vertebral hypermetabolic metastatic lesion is observed, new compared to the prior exam. 2. No active extraosseous malignancy is currently identified. 3. Right anterior fourth rib healing fracture, appears be   06/04/2016 - 01/08/2017 Chemotherapy   Second line chemotherapy Kadcyla every 3 weeks started on 06/04/16 and stopped on 01/08/17 due to mixed response.     10/05/2016 Imaging   CT CAP and Bone Scan:  Bones: left intramedullary rod and left femoral head neck screw in place. In the proximal diaphysis of the left femur there is cortical thickening and   irregularity. No lytic or blastic bone lesions. No fracture or vertebral endplate destruction.   No definite evidence of recurrent disease in the  chest, abdomen, or pelvis.   10/28/2016 Imaging   CT A/P IMPRESSION: No acute process demonstrated in the abdomen or pelvis. No evidence of bowel obstruction or inflammation.   11/04/2016 Imaging   DG foot complete left: IMPRESSION: No fracture or dislocation.  No soft tissue abnormality   11/23/2016 Imaging   CT RIGHT FEMUR IMPRESSION: Negative CT scan of the right thigh.  No visible metastatic disease.  CT LEFT FEMUR IMPRESSION: 1. Postsurgical changes related to prior cephalomedullary rod fixation of the left femur with linear lucency along the anterolateral proximal femoral cortex at level of the lesser trochanter, suspicious for nondisplaced fracture. 2. Slightly permeative appearance of the proximal left femoral cortex at the site of known prior osseous metastases is largely unchanged. 3. Unchanged patchy lucency and sclerosis along the anterior acetabulum, which appears to correspond with an area of faint uptake on the prior PET-CT, suspicious for osseous metastasis. These results will be called to the ordering clinician or representative by the Radiologist Assistant, and communication documented in the PACS or zVision Dashboard.     01/27/2017 Imaging   IMPRESSION: 1. Mixed response to osseous metastasis. Some hypermetabolic lesions are new and increasingly hypermetabolic, while another index lesion is decreasingly hypermetabolic. 2. No evidence of hypermetabolic soft tissue metastasis.    02/08/2017 - 10/19/2017 Antibody Plan   -Herceptin every 3 week since 02/08/17 -Oral Lanpantinib 1562m and decreased to 5045mdue to diarrhea and depression. Due to disease progression she swithced to Verzenio 15035mn 05/04/17. She did not toelrate well so she was switched to Ibrance 100m50m 05/31/17. Due to her neutropenia, Ibrance dose reduced to 75 mg daily, 3 weeks on, one-week off, stopped on 10/20/2017 due to disease progression.    04/30/2017 PET scan   IMPRESSION: 1.  Primarily increase in hypermetabolism of osseous metastasis. An isolated right acetabular lesion is decreased in hypermetabolism since the prior exam. 2. No evidence of hypermetabolic soft tissue metastasis. 3. Similar non FDG avid right-sided thyroid nodule, favoring a benign etiology. Recommend attention on follow-up.   08/30/2017 PET scan   08/30/2017 PET Scan  IMPRESSION: 1. Worsening osseous metastatic disease. 2. Focal hypermetabolism in the central spinal canal the L1 level, similar to the prior exam. Metastatic implant cannot be excluded. 3. Slight enlargement of a right thyroid nodule which now appears more cystic in character. Consider further evaluation with thyroid ultrasound. If patient is clinically hyperthyroid, consider nuclear medicine thyroid uptake and scan   08/30/2017 Progression   08/30/2017 PET Scan  IMPRESSION: 1. Worsening osseous metastatic disease. 2. Focal hypermetabolism in the central spinal canal the L1 level, similar to the prior exam. Metastatic implant cannot be excluded. 3. Slight enlargement of a right thyroid nodule which now appears more cystic in character. Consider further evaluation with thyroid ultrasound. If patient is clinically hyperthyroid, consider nuclear medicine thyroid uptake and scan   09/29/2017 - 10/11/2017 Radiation Therapy   Pt received 27 Gy in 9 fractions directed at the areas of significant uptake noted on recent bone scan the right pelvis and right proximal femur, with Dr, KinaSondra Come10/09/2017 - 04/07/2018 Chemotherapy   -Herceptin every 3 weeks starting 02/08/17, perjeta added on 10/20/2017  -weekly Taxol started on 10/21/2017. Will reduce Taxol to 2 weeks on, 1 week off starting on 02/04/17.  Stoped due to mild disease progression  10/27/2017 Imaging   10/27/2017 CT AP IMPRESSION: 1. No acute abnormality. No evidence of bowel obstruction or acute bowel inflammation. Normal appendix. 2. Stable patchy sclerotic osseous  metastases throughout the visualized skeleton. No new or progressive metastatic disease in the abdomen or pelvis.   01/11/2018 Imaging   Whole Body Bone Scan 01/11/18  IMPRESSION: 1. Multifocal osseous metastases as described. 2. Right scapular metastasis. 3. Right superior thoracic spinous process. 4. Right sacral ala and right L5 metastases. 5. Right acetabular metastasis. 6. Right proximal femur metastasis. 7. Diffuse metastases throughout the left femur. 8. Anterior right fourth rib metastasis.   01/11/2018 Imaging   CT CAP W Contrast 01/11/18  IMPRESSION: 1. Similar-appearing osseous metastatic disease. 2. No evidence for progressed metastatic disease in the chest, abdomen or pelvis.   03/30/2018 Imaging   Bone Scan 03/30/18  IMPRESSION: Stable multifocal bony metastatic disease.   03/30/2018 Imaging    CT CAP W Contrast 03/30/18 IMPRESSION: 1. New mild contiguous wall thickening with associated mucosal hyperenhancement throughout the left colon and rectum, suggesting nonspecific infectious or inflammatory proctocolitis. Differential includes C diff colitis. 2. Stable faint patchy sclerosis in the iliac bones and lumbar vertebral bodies. No new or progressive osseous metastatic disease by CT. Please note that the osseous lesions are relatively occult by CT and would be better evaluated by PET-CT, as clinically warranted. 3. No evidence of extra osseous metastatic disease.  These results will be called to the ordering clinician or representative by the Radiologist Assistant, and communication documented in the PACS or zVision Dashboard.   04/15/2018 - 06/16/2018 Chemotherapy   Enhertu q3weeks starting 04/15/18. Stopped Enhertu after cycle 3 on 05/27/18 due to poor toleration due to poor toleration with anorexia, weight loss, N&V, diarrhea   04/21/2018 PET scan   PET 04/21/18  IMPRESSION: 1. Overall marked interval improvement in bony hypermetabolic disease. All of the  previously identified hypermetabolic osseous metastases have decreased in hypermetabolism in the interval. There is a new tiny focus of hypermetabolism in the right aspect of the L3 vertebral body that appears to correspond to a new 8 mm lucency, raising concern for new metastatic disease. Close attention on follow-up recommended. 2. Focal hypermetabolism in the central spinal canal at the level of L1 previously has resolved. 3. No hypermetabolic soft tissue disease on today's study. 4. Right thyroid nodule seen previously has decreased in the interval.    06/16/2018 Imaging   CT LUMBAR SPINE WO CONTRAST 06/16/18 IMPRESSION: 1. Transitional lumbosacral anatomy. Correlation with radiographs is recommended prior to any operative intervention. 2. Suspicion of a right L3 vertebral body metastasis on the April PET-CT is not evident by CT. Stable degenerative versus metastatic lesion at the right L5 superior endplate. Small but increased right iliac bone metastasis since March. Lumbar MRI would best characterize lumbar metastatic disease (without contrast if necessary). 3. Suspect L2-L3 left subarticular segment disc herniation. Query left L2 radiculitis. 4. Stable appearance of rightward L4-L5 disc degeneration which could affect the right foramen and right lateral recess.   06/17/2018 - 11/16/2018 Chemotherapy   Restart Taxol 2 weeks on/1 week off with Herceptin and Perjeta q3weeks on 06/17/18. Perjeta was stopped after C5 due to diarrhea. Stopped Taxol after 11/16/18 due to disease progression to L3.    07/06/2018 PET scan   PET  IMPRESSION: 1. Near total resolution of hypermetabolic activity associated with skeletal metastasis. Very mild activity associated with the LEFT iliac bone which is similar to background blood pool activity. 2. No new   or progressive disease.   11/21/2018 PET scan   IMPRESSION: 1. New focal hypermetabolic lesion eccentric to the right in the L3 vertebral body,  maximum SUV 6.1 compatible with a new focus of active malignancy. 2. Very low-grade activity similar to blood pool in previous existing mildly sclerotic metastatic lesions including the left iliac bone lesion. Some of the mildly sclerotic lesions are not hypermetabolic compatible with effectively treated bony metastatic disease. 3. No hypermetabolic soft tissue lesions are identified.     11/30/2018 - 08/25/2019 Chemotherapy   Continue Herceptin q3weeks. Stopped 08/25/19 due to significant disease progression and switched to  IV Matuzumab   12/12/2018 - 12/17/2018 Chemotherapy   Tucatinib (TUKYSA) 6 tabs BID as a next line therapy starting 12/12/18. Stopped 12/17/18 duet o N&V, poor appetite and HA    12/21/2018 - 03/17/2019 Chemotherapy   IV Eribulin on day 1, 8 every 21 days starting 12/21/18. Stopped 03/17/19 due to mixed response to treatment.    03/21/2019 PET scan   IMPRESSION: 1. Mixed response to therapy of osseous metastasis. Although a right-sided L4 lesion demonstrates decreased hypermetabolism today, new left iliac and right ischial hypermetabolic lesions are seen. 2. No evidence of hypermetabolic soft tissue metastasis.     03/27/2019 - 07/21/2019 Chemotherapy   Neratinib (Nerlynx) titrate up from $Remov'120mg'YoLeJh$  to $R'240mg'km$  starting 03/27/19. She could not tolerate $RemoveBefo'160mg'EHpCeOyYLuL$  and stopped on 02/05/19 due to N&V and diarrhea. Will restart at $RemoveBe'120mg'adGfaIGPw$  on 04/10/18-04/27/19 and 4/19-4/22. Reduced to $RemoveBe'80mg'MlKfnxrfL$  once daily on 05/10/19. Held/stopped since 07/21/19 due to dry cough, Nausea and body aches.   06/23/2019 Imaging   CT Head  IMPRESSION: 1. No acute intracranial abnormality or metastatic disease identified. 2. Chronic white matter disease in the right hemisphere is stable from prior exams.     09/05/2019 PET scan   IMPRESSION: 1. Significant progression of widespread hypermetabolic osseous metastases throughout the axial skeleton as detailed. 2. No hypermetabolic extraosseous sites of metastatic disease.    09/15/2019 - 12/22/2019 Chemotherapy   -IV Margetuximab q3weeks and IV Gemcitabine 2 weeks on/1 week off starting 09/15/19. Carboplatin added to Gemcitabine starting with C3 on 11/03/19. D/c after C5 due to disease progression in bone.    12/13/2019 Imaging   PET  IMPRESSION: 1. Interval progression of widespread, FDG avid osseous metastasis involving the axial skeleton. 2. No FDG avid extra osseous sites of metastatic disease.     01/08/2020 -  Chemotherapy   Xeloda BID 2 weeks on/1 week off and Herpcetin q3weeks starting 01/08/20 C1 at $Rem'1000mg'MWKW$  BID and will increase to $RemoveBef'1500mg'GeTwueajwH$  BID if tolerable.       CURRENT THERAPY:  -Zometa q45months -IV Margetuximabq3weeks and IV Gemcitabine2 weeks on/1 week offstarting 09/15/19. Carboplatin addedtoGemcitabine starting with C3 on 11/03/19. D/c after C5 due to disease progression in bone.  -PENDING Xeloda BID 2 weeks on/1 week off and Herpcetin q3weeks starting 01/08/20. C1 at $Rem'1000mg'UCop$  BID and will increase to $RemoveBef'1500mg'HcPrOGIZsC$  BID if tolerable.   INTERVAL HISTORY:  [name] is here for a follow up. She presents to the clinic with her interpretor. She notes she is concerned with her cancer progression in bone. She notes her back pain. She notes her medications are the same and had refills yesterday. She notes she is taking the same amount of oxycodone 1-2 times a day. She notes lower back pain with occasional headaches. Her mid chest pain is very occasional and occasional SOB.    REVIEW OF SYSTEMS:   Constitutional: Denies fevers, chills or abnormal weight  loss (+) Occasional headaches Eyes: Denies blurriness of vision Ears, nose, mouth, throat, and face: Denies mucositis or sore throat Respiratory: Denies cough, dyspnea or wheezes (+) Occasional cheat pain and SOB Cardiovascular: Denies palpitation, chest discomfort or lower extremity swelling Gastrointestinal:  Denies nausea, heartburn or change in bowel habits MSK: (+) Lower back pain  Skin: Denies  abnormal skin rashes Lymphatics: Denies new lymphadenopathy or easy bruising Neurological:Denies numbness, tingling or new weaknesses Behavioral/Psych: Mood is stable, no new changes  All other systems were reviewed with the patient and are negative.  MEDICAL HISTORY:  Past Medical History:  Diagnosis Date  . Abnormal Pap smear   . Anxiety   . Breast cancer ([place])   . Cancer ([place]) 1999   breast-s/p mastectomy, chemo, rad  . CIN I (cervical intraepithelial neoplasia I) 2003   by colpo  . Congenital deafness   . Deaf   . Depression   . Port-A-Cath in place 2017   for chemotherapy  . Radiation 07/12/15-07/26/15   left femur 30 Gy  . S/P bilateral oophorectomy     SURGICAL HISTORY: Past Surgical History:  Procedure Laterality Date  . ABDOMINAL HYSTERECTOMY    . INTRAMEDULLARY (IM) NAIL INTERTROCHANTERIC Left 06/26/2015   Procedure: LEFT BIOMET LONG AFFIXS NAIL;  Surgeon: [name], MD;  Location: [facility];  Service: Orthopedics;  Laterality: Left;  . IR GENERIC HISTORICAL  08/06/2015   IR US GUIDE VASC ACCESS LEFT 08/06/2015 Aletta Edouard, MD [facility]  . IR GENERIC HISTORICAL  08/06/2015   IR FLUORO GUIDE CV LINE LEFT 08/06/2015 Aletta Edouard, MD [facility]  . IR GENERIC HISTORICAL  03/05/2016   IR CV LINE INJECTION 03/05/2016 Markus Daft, MD [facility]  . LEFT HEART CATH AND CORONARY ANGIOGRAPHY N/A 12/13/2017   Procedure: LEFT HEART CATH AND CORONARY ANGIOGRAPHY;  Surgeon: Leonie Man, MD;  Location: [facility];  Service: Cardiovascular;  Laterality: N/A;  . MASTECTOMY     right breast  . MASTECTOMY    . OVARIAN CYST REMOVAL    . RADIOLOGY WITH ANESTHESIA Left 06/25/2015   Procedure: MRI OF LEFT HIP WITH OR WITHOUT CONTRAST;  Surgeon: Medication Radiologist, MD;  Location: [facility];  Service: Radiology;  Laterality: Left;  DR. MCINTYRE/MRI  . TUBAL LIGATION      I have reviewed the social history and family history with the patient and they are unchanged from  previous note.  ALLERGIES:  is allergic to promethazine hcl, chlorhexidine, and diphenhydramine hcl.  MEDICATIONS:  Current Outpatient Medications  Medication Sig Dispense Refill  . ALPRAZolam (XANAX) 0.25 MG tablet Take 1 tablet (0.25 mg total) by mouth 2 (two) times daily as needed for anxiety. 30 tablet 0  . capecitabine (XELODA) 500 MG tablet Take 2 tablets (1,000 mg total) by mouth 2 (two) times daily after a meal. 56 tablet 0  . diphenoxylate-atropine (LOMOTIL) 2.5-0.025 MG tablet Take 1-2 tablets by mouth 4 (four) times daily as needed for diarrhea or loose stools. 60 tablet 1  . guaiFENesin-codeine 100-10 MG/5ML syrup Take 5 mLs by mouth every 4 (four) hours as needed for cough. 180 mL 0  . lidocaine-prilocaine (EMLA) cream Apply 1 application topically as needed. Apply to Porta-Cath 1-2 hours prior to access as directed. 90 g 2  . ondansetron (ZOFRAN) 8 MG tablet Take 1 tablet (8 mg total) by mouth every 8 (eight) hours as needed for nausea or vomiting. 45 tablet 1  . oxyCODONE (ROXICODONE) 5 MG immediate release  tablet Take 1 tablet (5 mg total) by mouth every 8 (eight) hours as needed for severe pain. 60 tablet 0  . potassium chloride (KLOR-CON) 10 MEQ tablet Take 3 tablets (30 mEq total) by mouth daily. 90 tablet 2  . prochlorperazine (COMPAZINE) 10 MG tablet Take 1 tablet (10 mg total) by mouth 2 (two) times daily as needed for nausea or vomiting. 10 tablet 0  . zolpidem (AMBIEN) 10 MG tablet Take 1 tablet (10 mg total) by mouth at bedtime as needed for sleep. 15 tablet 2   No current facility-administered medications for this visit.   Facility-Administered Medications Ordered in Other Visits  Medication Dose Route Frequency Provider Last Rate Last Admin  . potassium chloride SA (K-DUR) CR tablet 40 mEq  40 mEq Oral Once Truitt Merle, MD      . sodium chloride 0.9 % 1,000 mL with potassium chloride 10 mEq infusion   Intravenous Continuous Gordy Levan, MD   Stopped at 09/10/15  1653  . sodium chloride 0.9 % injection 10 mL  10 mL Intravenous PRN Livesay, Lennis P, MD        PHYSICAL EXAMINATION: ECOG PERFORMANCE STATUS: 2 - Symptomatic, <50% confined to bed  Vitals:   12/15/19 0811  BP: 117/70  Pulse: 81  Resp: 18  Temp: 98.1 F (36.7 C)  SpO2: 100%   Filed Weights   12/15/19 0811  Weight: 134 lb 6.4 oz (61 kg)    Due to COVID19 we will limit examination to appearance. Patient had no complaints.  GENERAL:alert, no distress and comfortable SKIN: skin color normal, no rashes or significant lesions EYES: normal, Conjunctiva are pink and non-injected, sclera clear  NEURO: alert & oriented x 3 with fluent speech   LABORATORY DATA:  I have reviewed the data as listed CBC Latest Ref Rng & Units 12/15/2019 12/01/2019 11/24/2019  WBC 4.0 - 10.5 K/uL 3.3(L) 3.1(L) 5.7  Hemoglobin 12.0 - 15.0 g/dL 8.7(L) 9.0(L) 9.1(L)  Hematocrit 36 - 46 % 26.0(L) 26.3(L) 27.3(L)  Platelets 150 - 400 K/uL 230 217 283     CMP Latest Ref Rng & Units 12/15/2019 12/01/2019 11/24/2019  Glucose 70 - 99 mg/dL 87 98 86  BUN 6 - 20 mg/dL $Remove'9 14 11  'DcXFJVv$ Creatinine 0.44 - 1.00 mg/dL 0.86 0.99 0.88  Sodium 135 - 145 mmol/L 142 141 142  Potassium 3.5 - 5.1 mmol/L 3.3(L) 3.4(L) 3.4(L)  Chloride 98 - 111 mmol/L 107 106 108  CO2 22 - 32 mmol/L $RemoveB'24 23 25  'SfNHrFoX$ Calcium 8.9 - 10.3 mg/dL 9.3 9.4 8.9  Total Protein 6.5 - 8.1 g/dL 6.5 6.6 6.4(L)  Total Bilirubin 0.3 - 1.2 mg/dL 1.8(H) 1.7(H) 1.4(H)  Alkaline Phos 38 - 126 U/L 73 73 87  AST 15 - 41 U/L $Remo'18 23 17  'poHgv$ ALT 0 - 44 U/L 22 34 18      RADIOGRAPHIC STUDIES: I have personally reviewed the radiological images as listed and agreed with the findings in the report. No results found.   ASSESSMENT & PLAN:  SANDA DEJOY is a 54 y.o. 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 She had right mastectomy, 4 cycles of AC-T and 5  years of Tamoxifen/AI.  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 -After her first line ofchemotreatment and initial maintenance therapy, she progressed or had poor toleration of multiple lines of  treatment since then.  -she is currently on 7th line therapywith anti-HER2IV Margetuximabq3weekswithIV Chemo weeklyGemcitabine2 weeks on/1 week off.She started on9/3/21. CarboplatinAUC 1 starting with C3 was added given concern for disease progression. -I personally reviewed and discussed her PET from 12/13/19, and compared to previous two PET scans, which showed interval disease progression in axial and pelvic bones. No obvious metastatic disease elsewhere. I personally reviewed images with patient today. There is more change in the past 8 months than in the past 3 months. She is not very symptomatic.  -I discussed the longer she is on treatment the less treatment options and efficacy she will have. I discussed she has tried most available anti-HER2 antobodies and has some chemotherapy options remaining such as Docetaxel or oral Xeloda with Herceptin. She is reluctant to proceed with Docetaxal due to prior side effects. She is willing to try Xeloda with Herceptin. She previously did not tolerated Perjeta well   --Chemotherapy consent: Side effects including but does not limited to, fatigue, nausea, vomiting, diarrhea, hair loss, neuropathy, fluid retention, renal and kidney dysfunction, neutropenic fever, needed for blood transfusion, bleeding, were discussed with patient in great detail. She agrees to proceed. -the goal of therapy is palliative  -To monitor tolerating, I would start her with Xeloda $RemoveBef'1000mg'vsPDnxLJqK$  BID 2 weeks on/1 week off for first cycle. If tolerating well will increase to $RemoveBef'1500mg'DssuKwAJbD$  BID. Continue Herceptin q3weeks. Plan to start 01/08/20.  -Labs reviewed and adequate to proceed with final C5D1  Gemcitabine and Carboplatin and Margetuximab today (due to mild  disease progression, OK to give one more cycle). Proceed with day 8 Gemcitabine and Carboplatin next week. -F/u with start of Herceptin and Xeloda on 12/27. F/u with me for toxicity check on 1/4.    2.Body pain, secondary to bone mets, Hypocalcemia, leg cramps  -She is being treated with Zometa q63months.Next injection in 01/2020 -She will continue oral calcium and Vit Dand Prilosec for Acid Reflux. -Current pain includes, lower back, upper legs, hips and rightshoulder.PET12/3/21does indicate disease progression in her spine, pelvis and right scapula. -Pain is well controlled on $RemoveBefor'5mg'ZCKvmEgARxyF$  Oxycodone1-2 tab daily.   3.Congenital deafness  4.Mild anemia, secondary to chemotherapy and malignancy -Moderate and Stable   5.Hypokalemia, Hypomagnesium, secondary toDiarrhea -She developed diarrhea s/p cycle 3 Perjetaand again with Nerlynx, Both have been D/c. -She is onOral potassium 16meqBID.She stopped oral Mag because it made her nauseous.  -She can take more potassium when she has diarrhea. -Her K is 3.3 today. I recommend she keep up with her oral potassium and take an extra pill today (12/15/19).   6.Goal of care discussion, DNR/DNI -Wepreviouslyreviewed goal of care andI recommend DNR/DNI, she previouslyagreed.  -I encouraged her to start setting up her living will.We previously discussed hospice care if she has no more treatment available or could not tolerate more treatment  7.Insomnia -She cannot take Benadryl due to allergy reaction. She has tried trazodone with no relief.  -She is on Xanax and Ambien$RemoveBeforeD'5mg'gJZbJmmZkZiDvL$ tabs nightly. I prescribe at $RemoveBefo'10mg'prVlhUyeaxy$ Ambien(take half tablet) given insurance.She knows not to take both at the same time.   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 Stable  9. Mildhyperbilirubinemia  -likely second to chemotherapy, fluctuates  -Will monitor closely. Stable.   PLAN: -Labs reviewed and adequate to proceed with C5D1 Gemcitabine  and Carboplatin and Margetuximab today.  -Proceed with lab, flush and day 8 Gemcitabine and Carboplatin next week.  -due to disease progression, will change therapy to Xeloda and Herceptin on 12/27 -Lab, flush, f/u with APP and  Herceptin on 12/27 -I called in Xeloda to start on 01/08/20 at $RemoveBef'1000mg'JTswUnWmrW$  BID for 14 days for first cycle.  -Lab, flush, f/u with me on 1/4    No problem-specific Assessment & Plan notes found for this encounter.   No orders of the defined types were placed in this encounter.  All questions were answered. The patient knows to call the clinic with any problems, questions or concerns. No barriers to learning was detected. The total time spent in the appointment was 40 minutes.     Truitt Merle, MD 12/15/2019   I, Joslyn Devon, am acting as scribe for Truitt Merle, MD.   I have reviewed the above documentation for accuracy and completeness, and I agree with the above.

## 2019-12-15 ENCOUNTER — Inpatient Hospital Stay: Payer: Medicaid Other | Attending: Hematology

## 2019-12-15 ENCOUNTER — Inpatient Hospital Stay (HOSPITAL_BASED_OUTPATIENT_CLINIC_OR_DEPARTMENT_OTHER): Payer: Medicaid Other | Admitting: Hematology

## 2019-12-15 ENCOUNTER — Encounter: Payer: Self-pay | Admitting: Hematology

## 2019-12-15 ENCOUNTER — Telehealth: Payer: Self-pay

## 2019-12-15 ENCOUNTER — Inpatient Hospital Stay: Payer: Medicaid Other

## 2019-12-15 ENCOUNTER — Other Ambulatory Visit: Payer: Self-pay

## 2019-12-15 VITALS — BP 117/70 | HR 81 | Temp 98.1°F | Resp 18 | Ht 65.0 in | Wt 134.4 lb

## 2019-12-15 DIAGNOSIS — Z452 Encounter for adjustment and management of vascular access device: Secondary | ICD-10-CM | POA: Diagnosis not present

## 2019-12-15 DIAGNOSIS — Z170 Estrogen receptor positive status [ER+]: Secondary | ICD-10-CM | POA: Insufficient documentation

## 2019-12-15 DIAGNOSIS — Z5111 Encounter for antineoplastic chemotherapy: Secondary | ICD-10-CM | POA: Diagnosis present

## 2019-12-15 DIAGNOSIS — C7951 Secondary malignant neoplasm of bone: Secondary | ICD-10-CM

## 2019-12-15 DIAGNOSIS — G620 Drug-induced polyneuropathy: Secondary | ICD-10-CM | POA: Diagnosis not present

## 2019-12-15 DIAGNOSIS — Z5112 Encounter for antineoplastic immunotherapy: Secondary | ICD-10-CM | POA: Insufficient documentation

## 2019-12-15 DIAGNOSIS — Z5189 Encounter for other specified aftercare: Secondary | ICD-10-CM | POA: Insufficient documentation

## 2019-12-15 DIAGNOSIS — E876 Hypokalemia: Secondary | ICD-10-CM | POA: Diagnosis not present

## 2019-12-15 DIAGNOSIS — Z95828 Presence of other vascular implants and grafts: Secondary | ICD-10-CM

## 2019-12-15 DIAGNOSIS — R197 Diarrhea, unspecified: Secondary | ICD-10-CM | POA: Diagnosis not present

## 2019-12-15 DIAGNOSIS — C50911 Malignant neoplasm of unspecified site of right female breast: Secondary | ICD-10-CM | POA: Diagnosis not present

## 2019-12-15 DIAGNOSIS — R110 Nausea: Secondary | ICD-10-CM | POA: Insufficient documentation

## 2019-12-15 LAB — CMP (CANCER CENTER ONLY)
ALT: 22 U/L (ref 0–44)
AST: 18 U/L (ref 15–41)
Albumin: 3.5 g/dL (ref 3.5–5.0)
Alkaline Phosphatase: 73 U/L (ref 38–126)
Anion gap: 11 (ref 5–15)
BUN: 9 mg/dL (ref 6–20)
CO2: 24 mmol/L (ref 22–32)
Calcium: 9.3 mg/dL (ref 8.9–10.3)
Chloride: 107 mmol/L (ref 98–111)
Creatinine: 0.86 mg/dL (ref 0.44–1.00)
GFR, Estimated: >60 mL/min (ref >60)
Glucose, Bld: 87 mg/dL (ref 70–99)
Potassium: 3.3 mmol/L — ABNORMAL LOW (ref 3.5–5.1)
Sodium: 142 mmol/L (ref 135–145)
Total Bilirubin: 1.8 mg/dL — ABNORMAL HIGH (ref 0.3–1.2)
Total Protein: 6.5 g/dL (ref 6.5–8.1)

## 2019-12-15 LAB — CBC WITH DIFFERENTIAL (CANCER CENTER ONLY)
Abs Immature Granulocytes: 0.01 10*3/uL (ref 0.00–0.07)
Basophils Absolute: 0 10*3/uL (ref 0.0–0.1)
Basophils Relative: 0 %
Eosinophils Absolute: 0.1 10*3/uL (ref 0.0–0.5)
Eosinophils Relative: 2 %
HCT: 26 % — ABNORMAL LOW (ref 36.0–46.0)
Hemoglobin: 8.7 g/dL — ABNORMAL LOW (ref 12.0–15.0)
Immature Granulocytes: 0 %
Lymphocytes Relative: 21 %
Lymphs Abs: 0.7 10*3/uL (ref 0.7–4.0)
MCH: 32.7 pg (ref 26.0–34.0)
MCHC: 33.5 g/dL (ref 30.0–36.0)
MCV: 97.7 fL (ref 80.0–100.0)
Monocytes Absolute: 0.5 10*3/uL (ref 0.1–1.0)
Monocytes Relative: 14 %
Neutro Abs: 2.1 10*3/uL (ref 1.7–7.7)
Neutrophils Relative %: 63 %
Platelet Count: 230 10*3/uL (ref 150–400)
RBC: 2.66 MIL/uL — ABNORMAL LOW (ref 3.87–5.11)
RDW: 15.7 % — ABNORMAL HIGH (ref 11.5–15.5)
WBC Count: 3.3 10*3/uL — ABNORMAL LOW (ref 4.0–10.5)
nRBC: 0 % (ref 0.0–0.2)

## 2019-12-15 MED ORDER — PALONOSETRON HCL INJECTION 0.25 MG/5ML
0.2500 mg | Freq: Once | INTRAVENOUS | Status: AC
  Administered 2019-12-15: 0.25 mg via INTRAVENOUS

## 2019-12-15 MED ORDER — ACETAMINOPHEN 325 MG PO TABS
650.0000 mg | ORAL_TABLET | Freq: Once | ORAL | Status: AC
  Administered 2019-12-15: 650 mg via ORAL

## 2019-12-15 MED ORDER — SODIUM CHLORIDE 0.9 % IV SOLN
100.0000 mg | Freq: Once | INTRAVENOUS | Status: AC
  Administered 2019-12-15: 100 mg via INTRAVENOUS
  Filled 2019-12-15: qty 10

## 2019-12-15 MED ORDER — PALONOSETRON HCL INJECTION 0.25 MG/5ML
INTRAVENOUS | Status: AC
  Filled 2019-12-15: qty 5

## 2019-12-15 MED ORDER — SODIUM CHLORIDE 0.9% FLUSH
10.0000 mL | INTRAVENOUS | Status: DC | PRN
  Administered 2019-12-15: 10 mL
  Filled 2019-12-15: qty 10

## 2019-12-15 MED ORDER — ACETAMINOPHEN 325 MG PO TABS
ORAL_TABLET | ORAL | Status: AC
  Filled 2019-12-15: qty 2

## 2019-12-15 MED ORDER — HEPARIN SOD (PORK) LOCK FLUSH 100 UNIT/ML IV SOLN
500.0000 [IU] | Freq: Once | INTRAVENOUS | Status: AC | PRN
  Administered 2019-12-15: 500 [IU]
  Filled 2019-12-15: qty 5

## 2019-12-15 MED ORDER — SODIUM CHLORIDE 0.9 % IV SOLN
15.0000 mg/kg | Freq: Once | INTRAVENOUS | Status: AC
  Administered 2019-12-15: 875 mg via INTRAVENOUS
  Filled 2019-12-15: qty 35

## 2019-12-15 MED ORDER — SODIUM CHLORIDE 0.9 % IV SOLN
10.0000 mg | Freq: Once | INTRAVENOUS | Status: AC
  Administered 2019-12-15: 10 mg via INTRAVENOUS
  Filled 2019-12-15: qty 10

## 2019-12-15 MED ORDER — LORATADINE 10 MG PO TABS
10.0000 mg | ORAL_TABLET | Freq: Once | ORAL | Status: AC
  Administered 2019-12-15: 10 mg via ORAL

## 2019-12-15 MED ORDER — SODIUM CHLORIDE 0.9 % IJ SOLN: 10.0000 mL | INTRAMUSCULAR | Status: DC | PRN

## 2019-12-15 MED ORDER — SODIUM CHLORIDE 0.9 % IV SOLN
Freq: Once | INTRAVENOUS | Status: AC
  Filled 2019-12-15: qty 250

## 2019-12-15 MED ORDER — SODIUM CHLORIDE 0.9% FLUSH
10.0000 mL | Freq: Once | INTRAVENOUS | Status: AC
  Administered 2019-12-15: 10 mL via INTRAVENOUS
  Filled 2019-12-15: qty 10

## 2019-12-15 MED ORDER — LORATADINE 10 MG PO TABS
ORAL_TABLET | ORAL | Status: AC
  Filled 2019-12-15: qty 1

## 2019-12-15 MED ORDER — SODIUM CHLORIDE 0.9 % IV SOLN
800.0000 mg/m2 | Freq: Once | INTRAVENOUS | Status: AC
  Administered 2019-12-15: 1330 mg via INTRAVENOUS
  Filled 2019-12-15: qty 34.98

## 2019-12-15 MED ORDER — CAPECITABINE 500 MG PO TABS: 1000.0000 mg | ORAL_TABLET | Freq: Two times a day (BID) | ORAL | 0 refills | Status: DC

## 2019-12-15 NOTE — Patient Instructions (Signed)

## 2019-12-15 NOTE — Progress Notes (Signed)
Per Dr. Burr Medico, okay to treat with elevated bilirubin.

## 2019-12-15 NOTE — Telephone Encounter (Signed)
Oral Oncology Patient Advocate Encounter  After completing a benefits investigation, prior authorization for Xeloda is not required at this time through [employer].  Patient's copay is $3.     [facility] Phone [phone] Fax [phone] 12/15/2019 9:21 AM

## 2019-12-15 NOTE — Patient Instructions (Signed)
[facility] Discharge Instructions for Patients Receiving Chemotherapy  Today you received the following chemotherapy agents: Gemzar/Carboplatin/Margenza.  To help prevent nausea and vomiting after your treatment, we encourage you to take your nausea medication as directed.   If you develop nausea and vomiting that is not controlled by your nausea medication, call the clinic.   BELOW ARE SYMPTOMS THAT SHOULD BE REPORTED IMMEDIATELY:  *FEVER GREATER THAN 100.5 F  *CHILLS WITH OR WITHOUT FEVER  NAUSEA AND VOMITING THAT IS NOT CONTROLLED WITH YOUR NAUSEA MEDICATION  *UNUSUAL SHORTNESS OF BREATH  *UNUSUAL BRUISING OR BLEEDING  TENDERNESS IN MOUTH AND THROAT WITH OR WITHOUT PRESENCE OF ULCERS  *URINARY PROBLEMS  *BOWEL PROBLEMS  UNUSUAL RASH Items with * indicate a potential emergency and should be followed up as soon as possible.  Feel free to call the clinic should you have any questions or concerns. The clinic phone number is [phone].  Please show the [facility] at check-in to the Emergency Department and triage nurse.

## 2019-12-16 LAB — CANCER ANTIGEN 27.29: CA 27.29: 83.2 U/mL — ABNORMAL HIGH (ref 0.0–38.6)

## 2019-12-18 ENCOUNTER — Telehealth: Payer: Self-pay | Admitting: Hematology

## 2019-12-18 ENCOUNTER — Telehealth: Payer: Self-pay | Admitting: Pharmacist

## 2019-12-18 ENCOUNTER — Other Ambulatory Visit: Payer: Self-pay | Admitting: Hematology

## 2019-12-18 DIAGNOSIS — C50911 Malignant neoplasm of unspecified site of right female breast: Secondary | ICD-10-CM

## 2019-12-18 DIAGNOSIS — C7951 Secondary malignant neoplasm of bone: Secondary | ICD-10-CM

## 2019-12-18 MED ORDER — CAPECITABINE 500 MG PO TABS: 1000.0000 mg | ORAL_TABLET | Freq: Two times a day (BID) | ORAL | 0 refills | Status: DC

## 2019-12-18 NOTE — Telephone Encounter (Signed)
Oral Oncology Pharmacist Encounter  Received new prescription for Xeloda (capecitabine) for the treatment of HR+, HER-2 positive metastatic breast cancer in conjunction with trastuzumab, planned duration until disease progression or unacceptable drug toxicity.  Prescription dose and frequency assessed for appropriateness. Per MD, pending patient tolerance to C1 of Xeloda, dose may be increased with cycle 2.   CMP and CBC w/ Diff from 12/15/19 assessed, noted Hgb 8.7 g/dL and t. Bili trended up to 1.8 mg/dL. No further dose adjustments required.  Current medication list in Epic reviewed, no relevant/significant DDIs with Xeloda identified.  Evaluated chart and no patient barriers to medication adherence noted.   Prescription has been e-scribed to the [facility] for benefits analysis and approval.  Oral Oncology Clinic will continue to follow for insurance authorization, copayment issues, initial counseling and start date.  Leron Croak, PharmD, BCPS Hematology/Oncology Clinical Pharmacist [facility] [phone] 12/18/2019 9:19 AM

## 2019-12-18 NOTE — Telephone Encounter (Signed)
Scheduled appts per 12/3 los. Pt to get updated appt calendar at next visit per appt notes.

## 2019-12-22 ENCOUNTER — Telehealth: Payer: Self-pay

## 2019-12-22 ENCOUNTER — Inpatient Hospital Stay: Payer: Medicaid Other

## 2019-12-22 ENCOUNTER — Other Ambulatory Visit: Payer: Self-pay

## 2019-12-22 VITALS — BP 114/65 | HR 80 | Temp 98.2°F | Resp 18

## 2019-12-22 DIAGNOSIS — C7951 Secondary malignant neoplasm of bone: Secondary | ICD-10-CM

## 2019-12-22 DIAGNOSIS — Z95828 Presence of other vascular implants and grafts: Secondary | ICD-10-CM

## 2019-12-22 DIAGNOSIS — C50911 Malignant neoplasm of unspecified site of right female breast: Secondary | ICD-10-CM

## 2019-12-22 DIAGNOSIS — Z5112 Encounter for antineoplastic immunotherapy: Secondary | ICD-10-CM | POA: Diagnosis not present

## 2019-12-22 LAB — CBC WITH DIFFERENTIAL (CANCER CENTER ONLY)
Abs Immature Granulocytes: 0.03 10*3/uL (ref 0.00–0.07)
Basophils Absolute: 0 10*3/uL (ref 0.0–0.1)
Basophils Relative: 1 %
Eosinophils Absolute: 0 10*3/uL (ref 0.0–0.5)
Eosinophils Relative: 2 %
HCT: 26.3 % — ABNORMAL LOW (ref 36.0–46.0)
Hemoglobin: 8.8 g/dL — ABNORMAL LOW (ref 12.0–15.0)
Immature Granulocytes: 2 %
Lymphocytes Relative: 27 %
Lymphs Abs: 0.4 10*3/uL — ABNORMAL LOW (ref 0.7–4.0)
MCH: 32.4 pg (ref 26.0–34.0)
MCHC: 33.5 g/dL (ref 30.0–36.0)
MCV: 96.7 fL (ref 80.0–100.0)
Monocytes Absolute: 0.1 10*3/uL (ref 0.1–1.0)
Monocytes Relative: 9 %
Neutro Abs: 1 10*3/uL — ABNORMAL LOW (ref 1.7–7.7)
Neutrophils Relative %: 59 %
Platelet Count: 169 10*3/uL (ref 150–400)
RBC: 2.72 MIL/uL — ABNORMAL LOW (ref 3.87–5.11)
RDW: 14.1 % (ref 11.5–15.5)
WBC Count: 1.6 10*3/uL — ABNORMAL LOW (ref 4.0–10.5)
nRBC: 0 % (ref 0.0–0.2)

## 2019-12-22 LAB — CMP (CANCER CENTER ONLY)
ALT: 29 U/L (ref 0–44)
AST: 20 U/L (ref 15–41)
Albumin: 3.9 g/dL (ref 3.5–5.0)
Alkaline Phosphatase: 76 U/L (ref 38–126)
Anion gap: 7 (ref 5–15)
BUN: 12 mg/dL (ref 6–20)
CO2: 26 mmol/L (ref 22–32)
Calcium: 9.5 mg/dL (ref 8.9–10.3)
Chloride: 106 mmol/L (ref 98–111)
Creatinine: 1 mg/dL (ref 0.44–1.00)
GFR, Estimated: >60 mL/min (ref >60)
Glucose, Bld: 87 mg/dL (ref 70–99)
Potassium: 3.5 mmol/L (ref 3.5–5.1)
Sodium: 139 mmol/L (ref 135–145)
Total Bilirubin: 2.1 mg/dL — ABNORMAL HIGH (ref 0.3–1.2)
Total Protein: 6.8 g/dL (ref 6.5–8.1)

## 2019-12-22 LAB — MAGNESIUM: Magnesium: 1.6 mg/dL — ABNORMAL LOW (ref 1.7–2.4)

## 2019-12-22 MED ORDER — SODIUM CHLORIDE 0.9% FLUSH
10.0000 mL | INTRAVENOUS | Status: DC | PRN
  Administered 2019-12-22: 10 mL
  Filled 2019-12-22: qty 10

## 2019-12-22 MED ORDER — SODIUM CHLORIDE 0.9% FLUSH
10.0000 mL | Freq: Once | INTRAVENOUS | Status: AC | PRN
  Administered 2019-12-22: 10 mL
  Filled 2019-12-22: qty 10

## 2019-12-22 MED ORDER — MAGNESIUM SULFATE 2 GM/50ML IV SOLN
2.0000 g | Freq: Once | INTRAVENOUS | Status: AC
  Administered 2019-12-22: 2 g via INTRAVENOUS

## 2019-12-22 MED ORDER — HEPARIN SOD (PORK) LOCK FLUSH 100 UNIT/ML IV SOLN
500.0000 [IU] | Freq: Once | INTRAVENOUS | Status: AC | PRN
  Administered 2019-12-22: 500 [IU]
  Filled 2019-12-22: qty 5

## 2019-12-22 MED ORDER — MAGNESIUM SULFATE 2 GM/50ML IV SOLN
INTRAVENOUS | Status: AC
  Filled 2019-12-22: qty 50

## 2019-12-22 MED ORDER — SODIUM CHLORIDE 0.9 % IJ SOLN
10.0000 mL | INTRAMUSCULAR | Status: DC | PRN
  Filled 2019-12-22: qty 10

## 2019-12-22 MED ORDER — PROCHLORPERAZINE MALEATE 10 MG PO TABS: 10.0000 mg | ORAL_TABLET | Freq: Once | ORAL | Status: DC

## 2019-12-22 MED ORDER — SODIUM CHLORIDE 0.9 % IV SOLN
100.0000 mg | Freq: Once | INTRAVENOUS | Status: AC
  Administered 2019-12-22: 100 mg via INTRAVENOUS
  Filled 2019-12-22: qty 10

## 2019-12-22 MED ORDER — PEGFILGRASTIM 6 MG/0.6ML ~~LOC~~ PSKT
PREFILLED_SYRINGE | SUBCUTANEOUS | Status: AC
  Filled 2019-12-22: qty 0.6

## 2019-12-22 MED ORDER — PEGFILGRASTIM 6 MG/0.6ML ~~LOC~~ PSKT
6.0000 mg | PREFILLED_SYRINGE | Freq: Once | SUBCUTANEOUS | Status: AC
  Administered 2019-12-22: 6 mg via SUBCUTANEOUS

## 2019-12-22 MED ORDER — SODIUM CHLORIDE 0.9 % IV SOLN
Freq: Once | INTRAVENOUS | Status: AC
  Filled 2019-12-22: qty 250

## 2019-12-22 MED ORDER — SODIUM CHLORIDE 0.9 % IV SOLN
800.0000 mg/m2 | Freq: Once | INTRAVENOUS | Status: AC
  Administered 2019-12-22: 1330 mg via INTRAVENOUS
  Filled 2019-12-22: qty 34.98

## 2019-12-22 NOTE — Patient Instructions (Signed)

## 2019-12-22 NOTE — Telephone Encounter (Signed)
Patient stable upon discharge from the infusion room. Ambulatory to lobby with interpreter.

## 2019-12-22 NOTE — Telephone Encounter (Signed)
Oral Chemotherapy Pharmacist Encounter  I spoke with patient for overview of: Xeloda (capecitabine) for the  treatment of HR+, HER-2 positive metastatic breast cancer in conjunction with trastuzumab, planned duration until disease progression or unacceptable drug toxicity.  Counseled patient on administration, dosing, side effects, monitoring, drug-food interactions, safe handling, storage, and disposal.  Patient will take Xeloda 541m tablets, 2 tablets (10052m by mouth in AM and 2 tabs (100013mby mouth in PM, within 30 minutes of finishing meals, for 14 days on, 7 days off, repeated every 21 days.  Xeloda start date: 01/16/19  Adverse effects include but are not limited to: fatigue, decreased blood counts, GI upset, diarrhea, mouth sores, and hand-foot syndrome.  Patient will obtain anti diarrheal and alert the office of 4 or more loose stools above baseline.  Reviewed with patient importance of keeping a medication schedule and plan for any missed doses. No barriers to medication adherence identified.  Medication reconciliation performed and medication/allergy list updated.  This will ship from the [facility] 12/25/19 to deliver to patient's home on 12/26/19.  Patient informed the pharmacy will reach out 5-7 days prior to needing next fill of Xeloda to coordinate continued medication acquisition to prevent break in therapy.  All questions answered.  Ms. EthCulhaneiced understanding and appreciation.   Medication education handout given to patient. Will mail patient medication calendar. Patient knows to call the office with questions or concerns. Oral Chemotherapy Clinic phone number provided to patient.   RebLeron CroakharmD, BCPS Hematology/Oncology Clinical Pharmacist [phone]/10/2019 9:44 AM

## 2019-12-22 NOTE — Patient Instructions (Signed)
[facility] Discharge Instructions for Patients Receiving Chemotherapy  Today you received the following chemotherapy agents Gemzar and Carboplatin To help prevent nausea and vomiting after your treatment, we encourage you to take your nausea medication as directed.   If you develop nausea and vomiting that is not controlled by your nausea medication, call the clinic.   BELOW ARE SYMPTOMS THAT SHOULD BE REPORTED IMMEDIATELY:  *FEVER GREATER THAN 100.5 F  *CHILLS WITH OR WITHOUT FEVER  NAUSEA AND VOMITING THAT IS NOT CONTROLLED WITH YOUR NAUSEA MEDICATION  *UNUSUAL SHORTNESS OF BREATH  *UNUSUAL BRUISING OR BLEEDING  TENDERNESS IN MOUTH AND THROAT WITH OR WITHOUT PRESENCE OF ULCERS  *URINARY PROBLEMS  *BOWEL PROBLEMS  UNUSUAL RASH Items with * indicate a potential emergency and should be followed up as soon as possible.  Feel free to call the clinic should you have any questions or concerns. The clinic phone number is [phone].  Please show the [facility] at check-in to the Emergency Department and triage nurse.   Hypomagnesemia Hypomagnesemia is a condition in which the level of magnesium in the blood is low. Magnesium is a mineral that is found in many foods. It is used in many different processes in the body. Hypomagnesemia can affect every organ in the body. In severe cases, it can cause life-threatening problems. What are the causes? This condition may be caused by:  Not getting enough magnesium in your diet.  Malnutrition.  Problems with absorbing magnesium from the intestines.  Dehydration.  Alcohol abuse.  Vomiting.  Severe or chronic diarrhea.  Some medicines, including medicines that make you urinate more (diuretics).  Certain diseases, such as kidney disease, diabetes, celiac disease, and overactive thyroid. What are the signs or symptoms? Symptoms of this condition include:  Loss of appetite.  Nausea and  vomiting.  Involuntary shaking or trembling of a body part (tremor).  Muscle weakness.  Tingling in the arms and legs.  Sudden tightening of muscles (muscle spasms).  Confusion.  Psychiatric issues, such as depression, irritability, or psychosis.  A feeling of fluttering of the heart.  Seizures. These symptoms are more severe if magnesium levels drop suddenly. How is this diagnosed? This condition may be diagnosed based on:  Your symptoms and medical history.  A physical exam.  Blood and urine tests. How is this treated? Treatment depends on the cause and the severity of the condition. It may be treated with:  A magnesium supplement. This can be taken in pill form. If the condition is severe, magnesium is usually given through an IV.  Changes to your diet. You may be directed to eat foods that have a lot of magnesium, such as green leafy vegetables, peas, beans, and nuts.  Stopping any intake of alcohol. Follow these instructions at home:      Make sure that your diet includes foods with magnesium. Foods that have a lot of magnesium in them include: ? Green leafy vegetables, such as spinach and broccoli. ? Beans and peas. ? Nuts and seeds, such as almonds and sunflower seeds. ? Whole grains, such as whole grain bread and fortified cereals.  Take magnesium supplements if your health care provider tells you to do that. Take them as directed.  Take over-the-counter and prescription medicines only as told by your health care provider.  Have your magnesium levels monitored as told by your health care provider.  When you are active, drink fluids that contain electrolytes.  Avoid drinking alcohol.  Keep all  follow-up visits as told by your health care provider. This is important. Contact a health care provider if:  You get worse instead of better.  Your symptoms return. Get help right away if you:  Develop severe muscle weakness.  Have trouble  breathing.  Feel that your heart is racing. Summary  Hypomagnesemia is a condition in which the level of magnesium in the blood is low.  Hypomagnesemia can affect every organ in the body.  Treatment may include eating more foods that contain magnesium, taking magnesium supplements, and not drinking alcohol.  Have your magnesium levels monitored as told by your health care provider. This information is not intended to replace advice given to you by your health care provider. Make sure you discuss any questions you have with your health care provider. Document Revised: 12/11/2016 Document Reviewed: 11/30/2016 Elsevier Patient Education  2020 [employer].

## 2019-12-22 NOTE — Telephone Encounter (Signed)
Per Dr. Burr Medico it is ok to treat today with ANC 1.0 and magnesium 1.6. Per Dr. Burr Medico patient to also receive 2 grams of magnesium today. Patient also requested to reschedule appointments on 12/27 to 1/4 due to being out of town for the holiday. Dr. Burr Medico made aware and stated it is ok to move appointments to 01/16/20. Scheduling message sent.

## 2019-12-25 MED FILL — CAPECITABINE 500 MG TABLET: 500 | 21 days supply | Qty: 56 | Fill #0

## 2019-12-26 ENCOUNTER — Other Ambulatory Visit: Payer: Self-pay

## 2019-12-26 ENCOUNTER — Telehealth: Payer: Self-pay

## 2019-12-26 DIAGNOSIS — R112 Nausea with vomiting, unspecified: Secondary | ICD-10-CM

## 2019-12-26 NOTE — Telephone Encounter (Signed)
[name] called stating she was having nv/d temp 100.2.  She is able to keep liquids down.  She requests appt with Dr Burr Medico.  Appts made for tomorrow.  Interpreter service called.

## 2019-12-27 ENCOUNTER — Inpatient Hospital Stay: Payer: Medicaid Other

## 2019-12-27 ENCOUNTER — Telehealth: Payer: Self-pay | Admitting: Hematology

## 2019-12-27 ENCOUNTER — Inpatient Hospital Stay (HOSPITAL_BASED_OUTPATIENT_CLINIC_OR_DEPARTMENT_OTHER): Payer: Medicaid Other | Admitting: Hematology

## 2019-12-27 ENCOUNTER — Other Ambulatory Visit: Payer: Self-pay

## 2019-12-27 ENCOUNTER — Encounter: Payer: Self-pay | Admitting: Hematology

## 2019-12-27 VITALS — BP 109/65 | HR 87 | Temp 98.4°F | Resp 16

## 2019-12-27 VITALS — BP 124/62 | HR 90 | Temp 99.0°F | Resp 14 | Ht 65.0 in | Wt 130.0 lb

## 2019-12-27 DIAGNOSIS — R112 Nausea with vomiting, unspecified: Secondary | ICD-10-CM

## 2019-12-27 DIAGNOSIS — C7951 Secondary malignant neoplasm of bone: Secondary | ICD-10-CM | POA: Diagnosis not present

## 2019-12-27 DIAGNOSIS — Z95828 Presence of other vascular implants and grafts: Secondary | ICD-10-CM

## 2019-12-27 DIAGNOSIS — C50911 Malignant neoplasm of unspecified site of right female breast: Secondary | ICD-10-CM

## 2019-12-27 DIAGNOSIS — R110 Nausea: Secondary | ICD-10-CM

## 2019-12-27 DIAGNOSIS — Z5112 Encounter for antineoplastic immunotherapy: Secondary | ICD-10-CM | POA: Diagnosis not present

## 2019-12-27 LAB — CBC WITH DIFFERENTIAL (CANCER CENTER ONLY)
Abs Immature Granulocytes: 0.03 10*3/uL (ref 0.00–0.07)
Basophils Absolute: 0 10*3/uL (ref 0.0–0.1)
Basophils Relative: 0 %
Eosinophils Absolute: 0 10*3/uL (ref 0.0–0.5)
Eosinophils Relative: 0 %
HCT: 23.9 % — ABNORMAL LOW (ref 36.0–46.0)
Hemoglobin: 8.2 g/dL — ABNORMAL LOW (ref 12.0–15.0)
Immature Granulocytes: 0 %
Lymphocytes Relative: 8 %
Lymphs Abs: 0.9 10*3/uL (ref 0.7–4.0)
MCH: 32.4 pg (ref 26.0–34.0)
MCHC: 34.3 g/dL (ref 30.0–36.0)
MCV: 94.5 fL (ref 80.0–100.0)
Monocytes Absolute: 0.5 10*3/uL (ref 0.1–1.0)
Monocytes Relative: 4 %
Neutro Abs: 10.1 10*3/uL — ABNORMAL HIGH (ref 1.7–7.7)
Neutrophils Relative %: 88 %
Platelet Count: 45 10*3/uL — ABNORMAL LOW (ref 150–400)
RBC: 2.53 MIL/uL — ABNORMAL LOW (ref 3.87–5.11)
RDW: 14.2 % (ref 11.5–15.5)
WBC Count: 11.5 10*3/uL — ABNORMAL HIGH (ref 4.0–10.5)
nRBC: 0 % (ref 0.0–0.2)

## 2019-12-27 LAB — CMP (CANCER CENTER ONLY)
ALT: 47 U/L — ABNORMAL HIGH (ref 0–44)
AST: 28 U/L (ref 15–41)
Albumin: 4.1 g/dL (ref 3.5–5.0)
Alkaline Phosphatase: 125 U/L (ref 38–126)
Anion gap: 8 (ref 5–15)
BUN: 14 mg/dL (ref 6–20)
CO2: 25 mmol/L (ref 22–32)
Calcium: 9.4 mg/dL (ref 8.9–10.3)
Chloride: 108 mmol/L (ref 98–111)
Creatinine: 0.88 mg/dL (ref 0.44–1.00)
GFR, Estimated: >60 mL/min (ref >60)
Glucose, Bld: 96 mg/dL (ref 70–99)
Potassium: 3.5 mmol/L (ref 3.5–5.1)
Sodium: 141 mmol/L (ref 135–145)
Total Bilirubin: 1.8 mg/dL — ABNORMAL HIGH (ref 0.3–1.2)
Total Protein: 6.9 g/dL (ref 6.5–8.1)

## 2019-12-27 LAB — MAGNESIUM: Magnesium: 1.6 mg/dL — ABNORMAL LOW (ref 1.7–2.4)

## 2019-12-27 MED ORDER — SODIUM CHLORIDE 0.9 % IJ SOLN: 10.0000 mL | INTRAMUSCULAR | Status: DC | PRN

## 2019-12-27 MED ORDER — DIPHENOXYLATE-ATROPINE 2.5-0.025 MG PO TABS: 1.0000 | ORAL_TABLET | Freq: Four times a day (QID) | ORAL | 1 refills | Status: DC | PRN

## 2019-12-27 MED ORDER — ALTEPLASE 2 MG IJ SOLR
2.0000 mg | Freq: Once | INTRAMUSCULAR | Status: DC | PRN
  Filled 2019-12-27: qty 2

## 2019-12-27 MED ORDER — HEPARIN SOD (PORK) LOCK FLUSH 100 UNIT/ML IV SOLN
500.0000 [IU] | Freq: Once | INTRAVENOUS | Status: DC | PRN
  Filled 2019-12-27: qty 5

## 2019-12-27 MED ORDER — SODIUM CHLORIDE 0.9 % IV SOLN
Freq: Once | INTRAVENOUS | Status: AC
  Filled 2019-12-27: qty 250

## 2019-12-27 MED ORDER — HEPARIN SOD (PORK) LOCK FLUSH 100 UNIT/ML IV SOLN
500.0000 [IU] | Freq: Once | INTRAVENOUS | Status: AC | PRN
  Administered 2019-12-27: 15:00:00 500 [IU] via INTRAVENOUS
  Filled 2019-12-27: qty 5

## 2019-12-27 MED ORDER — SODIUM CHLORIDE 0.9% FLUSH
10.0000 mL | Freq: Once | INTRAVENOUS | Status: AC
  Administered 2019-12-27: 11:00:00 10 mL via INTRAVENOUS
  Filled 2019-12-27: qty 10

## 2019-12-27 MED ORDER — SODIUM CHLORIDE 0.9 % IV SOLN
10.0000 mg | Freq: Once | INTRAVENOUS | Status: AC
  Administered 2019-12-27: 14:00:00 10 mg via INTRAVENOUS
  Filled 2019-12-27: qty 10

## 2019-12-27 MED ORDER — SODIUM CHLORIDE 0.9% FLUSH
10.0000 mL | INTRAVENOUS | Status: DC | PRN
  Administered 2019-12-27: 15:00:00 10 mL via INTRAVENOUS
  Filled 2019-12-27: qty 10

## 2019-12-27 MED ORDER — PROCHLORPERAZINE MALEATE 10 MG PO TABS: 10.0000 mg | ORAL_TABLET | Freq: Two times a day (BID) | ORAL | 1 refills | Status: DC | PRN

## 2019-12-27 MED ORDER — SODIUM CHLORIDE 0.9 % IV SOLN
150.0000 mg | Freq: Once | INTRAVENOUS | Status: AC
  Administered 2019-12-27: 13:00:00 150 mg via INTRAVENOUS
  Filled 2019-12-27: qty 150

## 2019-12-27 NOTE — Progress Notes (Signed)
[place]   Telephone:([phone]) [phone] Fax:(336) [phone]   Clinic Follow up Note   Patient Care Team: [name], [name], [name] as PCP - General (Physician Assistant) [name], MD as Consulting Physician (Orthopedic Surgery)  Date of Service:  12/27/2019  CHIEF COMPLAINT: Nausea and Diarrhea   SUMMARY OF ONCOLOGIC HISTORY: Oncology History Overview Note   Cancer Staging No matching staging information was found for the patient.     [name], right ([place])  11/1997 Cancer Diagnosis   Patient had 1.4 cm poorly differentiated right breast carcinoma diagnosed in Nov 1999 at age 70, 1/7 axillary nodes involved, ER/PR and HER 2 positive. She had mastectomy with the 7 axillary node evaluation, 4 cycles of adriamycin/ cytoxan followed by taxotere, then five years of tamoxifen thru June 2005 (no herceptin in 1999). She had bilateral oophorectomy in May 2005. She was briefly on aromatase inhibitor after tamoxifen, but discontinued this herself due to poor tolerance.   04/04/2015 Genetic Testing   Negative genetic testing on the breast/ovarian cancer pnael.  The Breast/Ovarian gene panel offered by [facility] includes sequencing and rearrangement analysis for the following 20 genes:  ATM, BARD1, BRCA1, BRCA2, BRIP1, CDH1, CHEK2, EPCAM, FANCC, MLH1, MSH2, MSH6, NBN, PALB2, PMS2, PTEN, RAD51C, RAD51D, TP53, and XRCC2.   06/26/2015 Pathology Results   Initial Path Report Bone, curettage, Left femur met, intramedullary subtroch tissue - METASTATIC ADENOCARCINOMA.  Estrogen Receptor: 95%, POSITIVE, STRONG STAINING INTENSITY Progesterone Receptor: 2%, POSITIVE, STRONG  HER2 (+), IHC 3+   06/26/2015 Surgery   Biopsy of metastatic tissue and stabilization with Affixus trochanteric nail, proximal and distal interlock of left femur by Dr. [name]    06/28/2015 Imaging   CT Chest w/ Contrast 1. Extensive right internal mammary chain lymphadenopathy  consistent with metastatic breast cancer. 2. Lytic metastases involving the posterior elements at T1 with probable nondisplaced pathologic fracture of the spinous process. 3. No other evidence of thoracic metastatic disease. 4. Trace bilateral pleural effusions with associated bibasilar atelectasis. 5. Indeterminate right thyroid nodule, not previously imaged. This could be evaluated with thyroid ultrasound as clinically warranted.   06/29/2015 -  Chemotherapy   Zometa started monthly on 06/29/15 and switched to q59month from 01/08/17   07/01/2015 Imaging   Bone Scan 1. Increased activity noted throughout the left femur. Although metastatic disease cannot be excluded, these changes are most likely secondary to prior surgery. 2. No other focal abnormalities identified to suggest metastatic disease.   07/12/2015 - 07/26/2015 Radiation Therapy   Left femur, 30 Gy in 10 fractions by Dr. [name]   07/14/2015 Initial Diagnosis   [name]   07/19/2015 Imaging   Initial PET Scan 1. Severe multifocal osseous metastatic disease, much greater than anticipated based on prior imaging. 2. Multifocal nodal metastases involving the right internal mammary, prevascular and subpectoral lymph nodes. No axillary pulmonary involvement identified. 3. No distant extra osseous metastases.   07/30/2015 - 11/29/2015 Chemotherapy   Docetaxel, Herceptin and Perjeta every 3 weeks X6 cycles, pt tolerated moderately well, restaging scan showed excellent response    09/18/2015 Imaging   CT Head w/wo Contrast 1. No acute intracranial abnormality or significant interval change. 2. Stable minimal periventricular white matter hypoattenuation on the right. This may reflect a remote ischemic injury. 3. No evidence for metastatic disease to the brain. 4. No focal soft tissue lesion to explain the patient's tenderness.   10/30/2015 Imaging   CT  Abdomen Pelvis w/ Contrast 1. Few mildly  prominent fluid-filled loops of small bowel scattered throughout the abdomen, with intraluminal fluid density within the distal colon. Given the provided history, findings are suggestive of acute enteritis/diarrheal illness. 2. No other acute intra-abdominal or pelvic process identified. 3. Widespread osseous metastatic disease, better evaluated on most recent PET-CT from 07/19/2015. No pathologic fracture or other complication. 4. 11 mm hypodensity within the left kidney. This lesion measures intermediate density, and is indeterminate. While this lesion is similar in size relative to recent studies, this is increased in size relative to prior study from 2012. Further evaluation with dedicated renal mass protocol CT and/or MRI is recommended for complete characterization.   12/2015 - 05/14/2016 Chemotherapy   Maintenance Herceptin and pejeta every 3 weeks stopped on 05/14/16 due to disease progression   12/25/2015 Imaging   Restaging PET Scan 1. Markedly improved skeletal activity, with only several faint foci of residual accentuated metabolic activity, but resolution of the vast majority of the previously extensive osseous metastatic disease. 2. Resolution of the prior right internal mammary and right prevascular lymph nodes. 3. Residual subcutaneous edema and edema along fascia planes in the left upper thigh. This remains somewhat more than I would expect for placement of an IM nail 6 weeks ago. If there is leg swelling further down, consider Doppler venous ultrasound to rule out left lower extremity DVT. I do not perceive an obvious difference in density between the pelvic and common femoral veins on the noncontrast CT data. 4. Chronic right maxillary and right sphenoid sinusitis.   01/16/2016 - 10/20/2017 Anti-estrogen oral therapy   Letrozole 2.5 mg daily. She was switched to exemestane due to disease progression on 02/08/17. Stopped on 10/20/2017 when treatment changed to chemo    03/01/2016  Miscellaneous   Patient presented to ED following a fall; she reports she landed on her back and is now experiencing back pain. The patient was evaluated and discharge home same day with pain control.   05/01/2016 Imaging   CT CAP IMPRESSION: 1. Stable exam.  No new or progressive disease identified. 2. No evidence for residual or recurrent adenopathy within the chest. 3. Stable bone metastasis.   05/01/2016 Imaging   BONE SCAN IMPRESSION: 1. Small focal uptake involving the anterior rib ends of the left second and right fourth ribs, new since the prior bone scan. The location of this uptake is more suggestive of a traumatic or inflammatory etiology as opposed to metastatic disease. No other evidence of metastatic disease. 2. There are other areas of stable uptake which are most likely degenerative/reactive, including right shoulder and cervical spine uptake and uptake along the right femur adjacent to the ORIF hardware.   05/20/2016 Imaging   NM PET Skull to Thigh IMPRESSION: 1. There multiple metastatic foci in the bony pelvis, including some new abnormal foci compare to the prior PET-CT, compatible with mildly progressive bony metastatic disease. Also, a left T11 vertebral hypermetabolic metastatic lesion is observed, new compared to the prior exam. 2. No active extraosseous malignancy is currently identified. 3. Right anterior fourth rib healing fracture, appears be   06/04/2016 - 01/08/2017 Chemotherapy   Second line chemotherapy Kadcyla every 3 weeks started on 06/04/16 and stopped on 01/08/17 due to mixed response.     10/05/2016 Imaging   CT CAP and Bone Scan:  Bones: left intramedullary rod and left femoral head neck screw in place. In the proximal diaphysis of the left femur there is cortical thickening and irregularity.  No lytic or blastic bone lesions. No fracture or vertebral endplate destruction.   No definite evidence of recurrent disease in the chest, abdomen, or  pelvis.   10/28/2016 Imaging   CT A/[name] IMPRESSION: No acute process demonstrated in the abdomen or pelvis. No evidence of bowel obstruction or inflammation.   11/04/2016 Imaging   DG foot complete left: IMPRESSION: No fracture or dislocation.  No soft tissue abnormality   11/23/2016 Imaging   CT RIGHT FEMUR IMPRESSION: Negative CT scan of the right thigh.  No visible metastatic disease.  CT LEFT FEMUR IMPRESSION: 1. Postsurgical changes related to prior cephalomedullary rod fixation of the left femur with linear lucency along the anterolateral proximal femoral cortex at level of the lesser trochanter, suspicious for nondisplaced fracture. 2. Slightly permeative appearance of the proximal left femoral cortex at the site of known prior osseous metastases is largely unchanged. 3. Unchanged patchy lucency and sclerosis along the anterior acetabulum, which appears to correspond with an area of faint uptake on the prior PET-CT, suspicious for osseous metastasis. These results will be called to the ordering clinician or representative by the Radiologist Assistant, and communication documented in the PACS or zVision Dashboard.     01/27/2017 Imaging   IMPRESSION: 1. Mixed response to osseous metastasis. Some hypermetabolic lesions are new and increasingly hypermetabolic, while another index lesion is decreasingly hypermetabolic. 2. No evidence of hypermetabolic soft tissue metastasis.    02/08/2017 - 10/19/2017 Antibody Plan   -Herceptin every 3 week since 02/08/17 -Oral Lanpantinib 1553m and decreased to 5025mdue to diarrhea and depression. Due to disease progression she swithced to Verzenio 15041mn 05/04/17. She did not toelrate well so she was switched to Ibrance 100m23m 05/31/17. Due to her neutropenia, Ibrance dose reduced to 75 mg daily, 3 weeks on, one-week off, stopped on 10/20/2017 due to disease progression.    04/30/2017 PET scan   IMPRESSION: 1. Primarily increase in  hypermetabolism of osseous metastasis. An isolated right acetabular lesion is decreased in hypermetabolism since the prior exam. 2. No evidence of hypermetabolic soft tissue metastasis. 3. Similar non FDG avid right-sided thyroid nodule, favoring a benign etiology. Recommend attention on follow-up.   08/30/2017 PET scan   08/30/2017 PET Scan  IMPRESSION: 1. Worsening osseous metastatic disease. 2. Focal hypermetabolism in the central spinal canal the L1 level, similar to the prior exam. Metastatic implant cannot be excluded. 3. Slight enlargement of a right thyroid nodule which now appears more cystic in character. Consider further evaluation with thyroid ultrasound. If patient is clinically hyperthyroid, consider nuclear medicine thyroid uptake and scan   08/30/2017 Progression   08/30/2017 PET Scan  IMPRESSION: 1. Worsening osseous metastatic disease. 2. Focal hypermetabolism in the central spinal canal the L1 level, similar to the prior exam. Metastatic implant cannot be excluded. 3. Slight enlargement of a right thyroid nodule which now appears more cystic in character. Consider further evaluation with thyroid ultrasound. If patient is clinically hyperthyroid, consider nuclear medicine thyroid uptake and scan   09/29/2017 - 10/11/2017 Radiation Therapy   Pt received 27 Gy in 9 fractions directed at the areas of significant uptake noted on recent bone scan the right pelvis and right proximal femur, with Dr, KinaSondra Come10/09/2017 - 04/07/2018 Chemotherapy   -Herceptin every 3 weeks starting 02/08/17, perjeta added on 10/20/2017  -weekly Taxol started on 10/21/2017. Will reduce Taxol to 2 weeks on, 1 week off starting on 02/04/17.  Stoped due to mild disease progression   10/27/2017  Imaging   10/27/2017 CT AP IMPRESSION: 1. No acute abnormality. No evidence of bowel obstruction or acute bowel inflammation. Normal appendix. 2. Stable patchy sclerotic osseous metastases throughout  the visualized skeleton. No new or progressive metastatic disease in the abdomen or pelvis.   01/11/2018 Imaging   Whole Body Bone Scan 01/11/18  IMPRESSION: 1. Multifocal osseous metastases as described. 2. Right scapular metastasis. 3. Right superior thoracic spinous process. 4. Right sacral ala and right L5 metastases. 5. Right acetabular metastasis. 6. Right proximal femur metastasis. 7. Diffuse metastases throughout the left femur. 8. Anterior right fourth rib metastasis.   01/11/2018 Imaging   CT CAP W Contrast 01/11/18  IMPRESSION: 1. Similar-appearing osseous metastatic disease. 2. No evidence for progressed metastatic disease in the chest, abdomen or pelvis.   03/30/2018 Imaging   Bone Scan 03/30/18  IMPRESSION: Stable multifocal bony metastatic disease.   03/30/2018 Imaging    CT CAP W Contrast 03/30/18 IMPRESSION: 1. New mild contiguous wall thickening with associated mucosal hyperenhancement throughout the left colon and rectum, suggesting nonspecific infectious or inflammatory proctocolitis. Differential includes C diff colitis. 2. Stable faint patchy sclerosis in the iliac bones and lumbar vertebral bodies. No new or progressive osseous metastatic disease by CT. Please note that the osseous lesions are relatively occult by CT and would be better evaluated by PET-CT, as clinically warranted. 3. No evidence of extra osseous metastatic disease.  These results will be called to the ordering clinician or representative by the Radiologist Assistant, and communication documented in the PACS or zVision Dashboard.   04/15/2018 - 06/16/2018 Chemotherapy   Enhertu q3weeks starting 04/15/18. Stopped Enhertu after cycle 3 on 05/27/18 due to poor toleration due to poor toleration with anorexia, weight loss, N&V, diarrhea   04/21/2018 PET scan   PET 04/21/18  IMPRESSION: 1. Overall marked interval improvement in bony hypermetabolic disease. All of the previously identified  hypermetabolic osseous metastases have decreased in hypermetabolism in the interval. There is a new tiny focus of hypermetabolism in the right aspect of the L3 vertebral body that appears to correspond to a new 8 mm lucency, raising concern for new metastatic disease. Close attention on follow-up recommended. 2. Focal hypermetabolism in the central spinal canal at the level of L1 previously has resolved. 3. No hypermetabolic soft tissue disease on today's study. 4. Right thyroid nodule seen previously has decreased in the interval.    06/16/2018 Imaging   CT LUMBAR SPINE WO CONTRAST 06/16/18 IMPRESSION: 1. Transitional lumbosacral anatomy. Correlation with radiographs is recommended prior to any operative intervention. 2. Suspicion of a right L3 vertebral body metastasis on the April PET-CT is not evident by CT. Stable degenerative versus metastatic lesion at the right L5 superior endplate. Small but increased right iliac bone metastasis since March. Lumbar MRI would best characterize lumbar metastatic disease (without contrast if necessary). 3. Suspect L2-L3 left subarticular segment disc herniation. Query left L2 radiculitis. 4. Stable appearance of rightward L4-L5 disc degeneration which could affect the right foramen and right lateral recess.   06/17/2018 - 11/16/2018 Chemotherapy   Restart Taxol 2 weeks on/1 week off with Herceptin and Perjeta q3weeks on 06/17/18. Perjeta was stopped after C5 due to diarrhea. Stopped Taxol after 11/16/18 due to disease progression to L3.    07/06/2018 PET scan   PET  IMPRESSION: 1. Near total resolution of hypermetabolic activity associated with skeletal metastasis. Very mild activity associated with the LEFT iliac bone which is similar to background blood pool activity. 2. No new or  progressive disease.   11/21/2018 PET scan   IMPRESSION: 1. New focal hypermetabolic lesion eccentric to the right in the L3 vertebral body, maximum SUV 6.1  compatible with a new focus of active malignancy. 2. Very low-grade activity similar to blood pool in previous existing mildly sclerotic metastatic lesions including the left iliac bone lesion. Some of the mildly sclerotic lesions are not hypermetabolic compatible with effectively treated bony metastatic disease. 3. No hypermetabolic soft tissue lesions are identified.     11/30/2018 - 08/25/2019 Chemotherapy   Continue Herceptin q3weeks. Stopped 08/25/19 due to significant disease progression and switched to  IV Matuzumab   12/12/2018 - 12/17/2018 Chemotherapy   Tucatinib (TUKYSA) 6 tabs BID as a next line therapy starting 12/12/18. Stopped 12/17/18 duet o N&V, poor appetite and HA    12/21/2018 - 03/17/2019 Chemotherapy   IV Eribulin on day 1, 8 every 21 days starting 12/21/18. Stopped 03/17/19 due to mixed response to treatment.    03/21/2019 PET scan   IMPRESSION: 1. Mixed response to therapy of osseous metastasis. Although a right-sided L4 lesion demonstrates decreased hypermetabolism today, new left iliac and right ischial hypermetabolic lesions are seen. 2. No evidence of hypermetabolic soft tissue metastasis.     03/27/2019 - 07/21/2019 Chemotherapy   Neratinib (Nerlynx) titrate up from 163m to 2446mstarting 03/27/19. She could not tolerate 16055mnd stopped on 02/05/19 due to N&V and diarrhea. Will restart at 120m29m 04/10/18-04/27/19 and 4/19-4/22. Reduced to 80mg31me daily on 05/10/19. Held/stopped since 07/21/19 due to dry cough, Nausea and body aches.   06/23/2019 Imaging   CT Head  IMPRESSION: 1. No acute intracranial abnormality or metastatic disease identified. 2. Chronic white matter disease in the right hemisphere is stable from prior exams.     09/05/2019 PET scan   IMPRESSION: 1. Significant progression of widespread hypermetabolic osseous metastases throughout the axial skeleton as detailed. 2. No hypermetabolic extraosseous sites of metastatic disease.   09/15/2019 -  12/22/2019 Chemotherapy   -IV Margetuximab q3weeks and IV Gemcitabine 2 weeks on/1 week off starting 09/15/19. Carboplatin added to Gemcitabine starting with C3 on 11/03/19. D/c after C5 due to disease progression in bone.    12/13/2019 Imaging   PET  IMPRESSION: 1. Interval progression of widespread, FDG avid osseous metastasis involving the axial skeleton. 2. No FDG avid extra osseous sites of metastatic disease.     01/19/2020 -  Chemotherapy   Xeloda BID 2 weeks on/1 week off and Herpcetin q3weeks starting 01/19/20  with C1 at 1000mg 35mand will increase to 1500mg B10mf tolerable.       CURRENT THERAPY:  -Zometa q3months40monthNG Xeloda BID 2 weeks on/1 week off and Herpcetin q3weeks starting 01/19/20. C1 at 1000mg BID3m will increase to 1500mg BID 88molerable.    INTERVAL HISTORY:  Debra C EtDANEISHA SURGESor a follow up. She presents to the clinic with her interrupter. She notes after her last neulasta on 12/22/19 she had bruise on her arm with pain from the nurse [name] whoElmyra Ricksd. She notes she has been nauseous since chemo on 12/22/19 and feels it is related to neulasta and not chemo because she has not felt it before. She has not been able to keep much down. She also notes diarrhea with multiple BM. She has tried lomotil. Zofran has not worked for her. She no longer has compazine.     REVIEW OF SYSTEMS:  Constitutional: Denies fevers, chills or abnormal weight loss (+) Low appetite  Eyes: Denies blurriness of vision Ears, nose, mouth, throat, and face: Denies mucositis or sore throat Respiratory: Denies cough, dyspnea or wheezes Cardiovascular: Denies palpitation, chest discomfort or lower extremity swelling Gastrointestinal:  (+) Nausea and diarrhea.  Skin: Denies abnormal skin rashes Lymphatics: Denies new lymphadenopathy or easy bruising Neurological:Denies numbness, tingling or new weaknesses Behavioral/Psych: Mood is stable, no new changes  All other systems were  reviewed with the patient and are negative.  MEDICAL HISTORY:  Past Medical History:  Diagnosis Date  . Abnormal Pap smear   . Anxiety   . Breast cancer ([place])   . Cancer ([place]) 1999   breast-s/[name] mastectomy, chemo, rad  . CIN I (cervical intraepithelial neoplasia I) 2003   by colpo  . Congenital deafness   . Deaf   . Depression   . Port-A-Cath in place 2017   for chemotherapy  . Radiation 07/12/15-07/26/15   left femur 30 Gy  . S/[name] bilateral oophorectomy     SURGICAL HISTORY: Past Surgical History:  Procedure Laterality Date  . ABDOMINAL HYSTERECTOMY    . INTRAMEDULLARY (IM) NAIL INTERTROCHANTERIC Left 06/26/2015   Procedure: LEFT BIOMET LONG AFFIXS NAIL;  Surgeon: [name], MD;  Location: [facility];  Service: Orthopedics;  Laterality: Left;  . IR GENERIC HISTORICAL  08/06/2015   IR US GUIDE VASC ACCESS LEFT 08/06/2015 Debra Edouard, MD [facility]  . IR GENERIC HISTORICAL  08/06/2015   IR FLUORO GUIDE CV LINE LEFT 08/06/2015 Debra Edouard, MD [facility]  . IR GENERIC HISTORICAL  03/05/2016   IR CV LINE INJECTION 03/05/2016 Debra Daft, MD [facility]  . LEFT HEART CATH AND CORONARY ANGIOGRAPHY N/A 12/13/2017   Procedure: LEFT HEART CATH AND CORONARY ANGIOGRAPHY;  Surgeon: Debra Man, MD;  Location: [facility];  Service: Cardiovascular;  Laterality: N/A;  . MASTECTOMY     right breast  . MASTECTOMY    . OVARIAN CYST REMOVAL    . RADIOLOGY WITH ANESTHESIA Left 06/25/2015   Procedure: MRI OF LEFT HIP WITH OR WITHOUT CONTRAST;  Surgeon: Medication Radiologist, MD;  Location: [facility];  Service: Radiology;  Laterality: Left;  DR. MCINTYRE/MRI  . TUBAL LIGATION      I have reviewed the social history and family history with the patient and they are unchanged from previous note.  ALLERGIES:  is allergic to promethazine hcl, chlorhexidine, and diphenhydramine hcl.  MEDICATIONS:  Current Outpatient Medications  Medication Sig Dispense Refill  . ALPRAZolam (XANAX) 0.25  MG tablet Take 1 tablet (0.25 mg total) by mouth 2 (two) times daily as needed for anxiety. 30 tablet 0  . capecitabine (XELODA) 500 MG tablet Take 2 tablets (1,000 mg total) by mouth 2 (two) times daily after a meal. Take for 14 days on, 7 days off. Repeat every 21 days. 56 tablet 0  . diphenoxylate-atropine (LOMOTIL) 2.5-0.025 MG tablet Take 1-2 tablets by mouth 4 (four) times daily as needed for diarrhea or loose stools. 60 tablet 1  . guaiFENesin-codeine 100-10 MG/5ML syrup Take 5 mLs by mouth every 4 (four) hours as needed for cough. 180 mL 0  . lidocaine-prilocaine (EMLA) cream Apply 1 application topically as needed. Apply to Porta-Cath 1-2 hours prior to access as directed. 90 g 2  . ondansetron (ZOFRAN) 8 MG tablet Take 1 tablet (8 mg total) by mouth every 8 (eight) hours as needed for nausea or vomiting. 45 tablet 1  . oxyCODONE (ROXICODONE) 5 MG immediate release tablet Take 1 tablet (5 mg total)  by mouth every 8 (eight) hours as needed for severe pain. 60 tablet 0  . potassium chloride (KLOR-CON) 10 MEQ tablet Take 3 tablets (30 mEq total) by mouth daily. 90 tablet 2  . prochlorperazine (COMPAZINE) 10 MG tablet Take 1 tablet (10 mg total) by mouth 2 (two) times daily as needed for nausea or vomiting. 20 tablet 1  . zolpidem (AMBIEN) 10 MG tablet Take 1 tablet (10 mg total) by mouth at bedtime as needed for sleep. 15 tablet 2   No current facility-administered medications for this visit.   Facility-Administered Medications Ordered in Other Visits  Medication Dose Route Frequency Provider Last Rate Last Admin  . alteplase (CATHFLO ACTIVASE) injection 2 mg  2 mg Intracatheter Once PRN Debra Merle, MD      . alteplase (CATHFLO ACTIVASE) injection 2 mg  2 mg Intracatheter Once PRN Debra Merle, MD      . potassium chloride SA (K-DUR) CR tablet 40 mEq  40 mEq Oral Once Debra Merle, MD      . sodium chloride 0.9 % 1,000 mL with potassium chloride 10 mEq infusion   Intravenous Continuous Debra Levan, MD   Stopped at 09/10/15 1653  . sodium chloride 0.9 % injection 10 mL  10 mL Intravenous PRN [name], Debra P, MD      . sodium chloride flush (NS) 0.9 % injection 10 mL  10 mL Intravenous PRN Debra Merle, MD   10 mL at 12/27/19 1450    PHYSICAL EXAMINATION: ECOG PERFORMANCE STATUS: 2 - Symptomatic, <50% confined to bed  Vitals:   12/27/19 1113  BP: 124/62  Pulse: 90  Resp: 14  Temp: 99 F (37.2 C)  SpO2: 100%   Filed Weights   12/27/19 1113  Weight: 130 lb (59 kg)    Due to COVID19 we will limit examination to appearance. Patient had no complaints.  GENERAL:alert, no distress and comfortable SKIN: skin color normal, no rashes or significant lesions EYES: normal, Conjunctiva are pink and non-injected, sclera clear  NEURO: alert & oriented x 3 with fluent speech   LABORATORY DATA:  I have reviewed the data as listed CBC Latest Ref Rng & Units 12/27/2019 12/22/2019 12/15/2019  WBC 4.0 - 10.5 K/uL 11.5(H) 1.6(L) 3.3(L)  Hemoglobin 12.0 - 15.0 g/dL 8.2(L) 8.8(L) 8.7(L)  Hematocrit 36.0 - 46.0 % 23.9(L) 26.3(L) 26.0(L)  Platelets 150 - 400 K/uL 45(L) 169 230     CMP Latest Ref Rng & Units 12/27/2019 12/22/2019 12/15/2019  Glucose 70 - 99 mg/dL 96 87 87  BUN 6 - 20 mg/dL 14 12 9   Creatinine 0.44 - 1.00 mg/dL 0.88 1.00 0.86  Sodium 135 - 145 mmol/L 141 139 142  Potassium 3.5 - 5.1 mmol/L 3.5 3.5 3.3(L)  Chloride 98 - 111 mmol/L 108 106 107  CO2 22 - 32 mmol/L 25 26 24   Calcium 8.9 - 10.3 mg/dL 9.4 9.5 9.3  Total Protein 6.5 - 8.1 g/dL 6.9 6.8 6.5  Total Bilirubin 0.3 - 1.2 mg/dL 1.8(H) 2.1(H) 1.8(H)  Alkaline Phos 38 - 126 U/L 125 76 73  AST 15 - 41 U/L 28 20 18   ALT 0 - 44 U/L 47(H) 29 22      RADIOGRAPHIC STUDIES: I have personally reviewed the radiological images as listed and agreed with the findings in the report. No results found.   ASSESSMENT & PLAN:  [name] is a 54 y.o. female with    1. Nausea and Diarrhea, Thrombocytopenia  -After  last dose chemo on 12/22/19 she had nausea and diarrhea and has not been able to keep anything down. She was able to tolerate this regimen well before.  -For nausea Zofran does not work. I will call in Compazine, but given allergy to phenergan she can start at half tablet. If tolerable she can increase to whole tablet up to 4 tabs daily.  -For her diarrhea she will continue lomotil. Given prescription is out of date, I will refill today (12/27/19) -Labs reviewed, WBC 11.5, Hg 8.2, 45K, ANC 10.1. I will give IV Fluids, antiemetics and steroids today.  -Given her significant thrombocytopenia, I advised her to avoid fall or injury given increased risk for bleeding.  -Will repeat labs on 12/17.    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 She had right mastectomy, 4 cycles of AC-T and 5 years of Tamoxifen/AI.  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 -After her first line ofchemotreatment and initial maintenance therapy, she progressed or had poor toleration of multiple lines of treatment since then.  -Based on 12/13/19 PET, she recently progressed diffusely in the bone on 7th line therapywith anti-HER2IV Margetuximab q3weeks withIV Chemo weeklyGemcitabine(Carboplatin added C3) 2 weeks on/1 week off after 5 cycles, 09/15/19-12/22/19.  -I previously discussed the longer she is on treatment the less treatment options and efficacy she will have. I discussed she has tried most available anti-HER2 antibodies and has some chemotherapy options remaining such as Docetaxel or oral Xeloda with Herceptin. She is reluctant to proceed with Docetaxel due to prior side effects. Plan to start Xeloda 1015m BID 2 weeks on/1 week off for first cycle. If tolerating well will increase to 15063mBID. Continue Herceptin q3weeks. Plan to start 01/19/19.  -Pt notes after her last neulasta on  12/22/19 she had bruise on her arm with pain from the nurse NiElmyra Ricksho placed.    PLAN: -I called in Compazine and Lomotil today  -IV Fluids with antiemetics (iv emend and dexa) today  -Lab on 12/17 to see if she needs blood transfusion  -Lab, flush, f/u and Herceptin on 1/7. Will start Xeloda at that time.  -will ask our oral pharmacist ReWells Guileso send her the instruction of dissolving Xeloda before taking it    No problem-specific Assessment & Plan notes found for this encounter.   No orders of the defined types were placed in this encounter.  All questions were answered. The patient knows to call the clinic with any problems, questions or concerns. No barriers to learning was detected. The total time spent in the appointment was 25 minutes.     YaTruitt MerleMD 12/27/2019   I, AmJoslyn Devonam acting as scribe for YaTruitt MerleMD.   I have reviewed the above documentation for accuracy and completeness, and I agree with the above.

## 2019-12-27 NOTE — Patient Instructions (Signed)

## 2019-12-27 NOTE — Telephone Encounter (Signed)
Scheduled appointments per 12/15 los. Spoke to patient's interpreter who is aware of appointments dates and times.

## 2019-12-28 LAB — CANCER ANTIGEN 27.29: CA 27.29: 83.3 U/mL — ABNORMAL HIGH (ref 0.0–38.6)

## 2019-12-29 ENCOUNTER — Other Ambulatory Visit: Payer: Self-pay

## 2019-12-29 ENCOUNTER — Telehealth: Payer: Self-pay | Admitting: Hematology

## 2019-12-29 ENCOUNTER — Other Ambulatory Visit: Payer: Self-pay | Admitting: Hematology

## 2019-12-29 ENCOUNTER — Inpatient Hospital Stay: Payer: Medicaid Other

## 2019-12-29 DIAGNOSIS — C7951 Secondary malignant neoplasm of bone: Secondary | ICD-10-CM

## 2019-12-29 DIAGNOSIS — C50911 Malignant neoplasm of unspecified site of right female breast: Secondary | ICD-10-CM

## 2019-12-29 DIAGNOSIS — Z95828 Presence of other vascular implants and grafts: Secondary | ICD-10-CM

## 2019-12-29 DIAGNOSIS — Z5112 Encounter for antineoplastic immunotherapy: Secondary | ICD-10-CM | POA: Diagnosis not present

## 2019-12-29 LAB — CMP (CANCER CENTER ONLY)
ALT: 29 U/L (ref 0–44)
AST: 16 U/L (ref 15–41)
Albumin: 3.7 g/dL (ref 3.5–5.0)
Alkaline Phosphatase: 121 U/L (ref 38–126)
Anion gap: 13 (ref 5–15)
BUN: 14 mg/dL (ref 6–20)
CO2: 20 mmol/L — ABNORMAL LOW (ref 22–32)
Calcium: 8.7 mg/dL — ABNORMAL LOW (ref 8.9–10.3)
Chloride: 106 mmol/L (ref 98–111)
Creatinine: 0.81 mg/dL (ref 0.44–1.00)
GFR, Estimated: >60 mL/min (ref >60)
Glucose, Bld: 97 mg/dL (ref 70–99)
Potassium: 3.5 mmol/L (ref 3.5–5.1)
Sodium: 139 mmol/L (ref 135–145)
Total Bilirubin: 0.9 mg/dL (ref 0.3–1.2)
Total Protein: 6.3 g/dL — ABNORMAL LOW (ref 6.5–8.1)

## 2019-12-29 LAB — CBC WITH DIFFERENTIAL (CANCER CENTER ONLY)
Abs Immature Granulocytes: 0.32 10*3/uL — ABNORMAL HIGH (ref 0.00–0.07)
Basophils Absolute: 0 10*3/uL (ref 0.0–0.1)
Basophils Relative: 0 %
Eosinophils Absolute: 0 10*3/uL (ref 0.0–0.5)
Eosinophils Relative: 0 %
HCT: 21.9 % — ABNORMAL LOW (ref 36.0–46.0)
Hemoglobin: 7.2 g/dL — ABNORMAL LOW (ref 12.0–15.0)
Immature Granulocytes: 2 %
Lymphocytes Relative: 6 %
Lymphs Abs: 1 10*3/uL (ref 0.7–4.0)
MCH: 32.3 pg (ref 26.0–34.0)
MCHC: 32.9 g/dL (ref 30.0–36.0)
MCV: 98.2 fL (ref 80.0–100.0)
Monocytes Absolute: 0.9 10*3/uL (ref 0.1–1.0)
Monocytes Relative: 5 %
Neutro Abs: 13.8 10*3/uL — ABNORMAL HIGH (ref 1.7–7.7)
Neutrophils Relative %: 87 %
Platelet Count: 27 10*3/uL — ABNORMAL LOW (ref 150–400)
RBC: 2.23 MIL/uL — ABNORMAL LOW (ref 3.87–5.11)
RDW: 14.2 % (ref 11.5–15.5)
WBC Count: 15.9 10*3/uL — ABNORMAL HIGH (ref 4.0–10.5)
nRBC: 0.3 % — ABNORMAL HIGH (ref 0.0–0.2)

## 2019-12-29 MED ORDER — HEPARIN SOD (PORK) LOCK FLUSH 100 UNIT/ML IV SOLN
500.0000 [IU] | Freq: Once | INTRAVENOUS | Status: AC | PRN
  Administered 2019-12-29: 09:00:00 500 [IU] via INTRAVENOUS
  Filled 2019-12-29: qty 5

## 2019-12-29 MED ORDER — SODIUM CHLORIDE 0.9% FLUSH
10.0000 mL | INTRAVENOUS | Status: DC | PRN
  Administered 2019-12-29: 09:00:00 10 mL via INTRAVENOUS
  Filled 2019-12-29: qty 10

## 2019-12-29 NOTE — Telephone Encounter (Signed)
I called patient to discuss lab results from today.  Her hemoglobin dropped to 7.2 and platelet is 27K.  She is more fatigued, but is still able to function at home.  She denies bleeding or fever.  I recommend blood transfusion tomorrow and repeat CBC next Monday to see if she needs platelet transfusion.  However she is leaving town tomorrow morning to [place] for Christmas and New Year.  She declined a blood transfusion.  I told her to watch her symptoms, if she experiences severe fatigue, or bleeding, she should go to local emergency room, or come back to [place] early and see [place].  She voiced good understanding, and agrees with the plan.  Truitt Merle  12/29/2019

## 2019-12-29 NOTE — Patient Instructions (Signed)

## 2020-01-08 ENCOUNTER — Ambulatory Visit: Payer: Medicaid Other | Admitting: Nurse Practitioner

## 2020-01-08 ENCOUNTER — Other Ambulatory Visit: Payer: Medicaid Other

## 2020-01-08 ENCOUNTER — Ambulatory Visit: Payer: Medicaid Other

## 2020-01-12 NOTE — Progress Notes (Signed)
.   Pharmacist Chemotherapy Monitoring - Initial Assessment    Anticipated start date: 01/19/20   Regimen:  . Are orders appropriate based on the patient's diagnosis, regimen, and cycle? Yes . Does the plan date match the patient's scheduled date? Yes . Is the sequencing of drugs appropriate? Yes . Are the premedications appropriate for the patient's regimen? Yes . Prior Authorization for treatment is: Approved o If applicable, is the correct biosimilar selected based on the patient's insurance? yes  Organ Function and Labs: Marland Kitchen Are dose adjustments needed based on the patient's renal function, hepatic function, or hematologic function? No . Are appropriate labs ordered prior to the start of patient's treatment? Yes . Other organ system assessment, if indicated: trastuzumab: Echo/ MUGA . The following baseline labs, if indicated, have been ordered: N/A  Dose Assessment: . Are the drug doses appropriate? Yes . Are the following correct: o Drug concentrations Yes o IV fluid compatible with drug Yes o Administration routes Yes o Timing of therapy Yes . If applicable, does the patient have documented access for treatment and/or plans for port-a-cath placement? yes . If applicable, have lifetime cumulative doses been properly documented and assessed? yes Lifetime Dose Tracking  . Carboplatin: 600 mg = 0.01 % of the maximum lifetime dose of 999,999,999 mg  o   Toxicity Monitoring/Prevention: . The patient has the following take home antiemetics prescribed: N/A . The patient has the following take home medications prescribed: N/A . Medication allergies and previous infusion related reactions, if applicable, have been reviewed and addressed. Yes . The patient's current medication list has been assessed for drug-drug interactions with their chemotherapy regimen. no significant drug-drug interactions were identified on review.  Order Review: . Are the treatment plan orders signed? Yes . Is  the patient scheduled to see a provider prior to their treatment? Yes  I verify that I have reviewed each item in the above checklist and answered each question accordingly.  Stephens Shire 01/12/2020 10:38 AM

## 2020-01-16 ENCOUNTER — Other Ambulatory Visit: Payer: Medicaid Other

## 2020-01-16 ENCOUNTER — Ambulatory Visit: Payer: Medicaid Other

## 2020-01-16 ENCOUNTER — Ambulatory Visit: Payer: Medicaid Other | Admitting: Hematology

## 2020-01-17 NOTE — Progress Notes (Signed)
[place]   [phone] [phone]   Clinic Follow up Note   Patient Care Team: [name], [name], [name] as PCP - General (Physician Assistant) [name], MD as Consulting Physician (Orthopedic Surgery)  Date of Service:  01/19/2020  CHIEF COMPLAINT: f/u of metastatic breast cancer  SUMMARY OF ONCOLOGIC HISTORY: Oncology History Overview Note   Cancer Staging No matching staging information was found for the patient.     [name], right ([place])  11/1997 Cancer Diagnosis   Patient had 1.4 cm poorly differentiated right breast carcinoma diagnosed in Nov 1999 at age 89, 1/7 axillary nodes involved, ER/PR and HER 2 positive. She had mastectomy with the 7 axillary node evaluation, 4 cycles of adriamycin/ cytoxan followed by taxotere, then five years of tamoxifen thru June 2005 (no herceptin in 1999). She had bilateral oophorectomy in May 2005. She was briefly on aromatase inhibitor after tamoxifen, but discontinued this herself due to poor tolerance.   04/04/2015 Genetic Testing   Negative genetic testing on the breast/ovarian cancer pnael.  The Breast/Ovarian gene panel offered by [facility] includes sequencing and rearrangement analysis for the following 20 genes:  ATM, BARD1, BRCA1, BRCA2, BRIP1, CDH1, CHEK2, EPCAM, FANCC, MLH1, MSH2, MSH6, NBN, PALB2, PMS2, PTEN, RAD51C, RAD51D, TP53, and XRCC2.   06/26/2015 Pathology Results   Initial Path Report Bone, curettage, Left femur met, intramedullary subtroch tissue - METASTATIC ADENOCARCINOMA.  Estrogen Receptor: 95%, POSITIVE, STRONG STAINING INTENSITY Progesterone Receptor: 2%, POSITIVE, STRONG  HER2 (+), IHC 3+   06/26/2015 Surgery   Biopsy of metastatic tissue and stabilization with Affixus trochanteric nail, proximal and distal interlock of left femur by Dr. [name]    06/28/2015 Imaging   CT Chest w/ Contrast 1. Extensive right internal mammary chain  lymphadenopathy consistent with metastatic breast cancer. 2. Lytic metastases involving the posterior elements at T1 with probable nondisplaced pathologic fracture of the spinous process. 3. No other evidence of thoracic metastatic disease. 4. Trace bilateral pleural effusions with associated bibasilar atelectasis. 5. Indeterminate right thyroid nodule, not previously imaged. This could be evaluated with thyroid ultrasound as clinically warranted.   06/29/2015 -  Chemotherapy   Zometa started monthly on 06/29/15 and switched to q13month from 01/08/17   07/01/2015 Imaging   Bone Scan 1. Increased activity noted throughout the left femur. Although metastatic disease cannot be excluded, these changes are most likely secondary to prior surgery. 2. No other focal abnormalities identified to suggest metastatic disease.   07/12/2015 - 07/26/2015 Radiation Therapy   Left femur, 30 Gy in 10 fractions by Dr. [name]   07/14/2015 Initial Diagnosis   [name]   07/19/2015 Imaging   Initial PET Scan 1. Severe multifocal osseous metastatic disease, much greater than anticipated based on prior imaging. 2. Multifocal nodal metastases involving the right internal mammary, prevascular and subpectoral lymph nodes. No axillary pulmonary involvement identified. 3. No distant extra osseous metastases.   07/30/2015 - 11/29/2015 Chemotherapy   Docetaxel, Herceptin and Perjeta every 3 weeks X6 cycles, pt tolerated moderately well, restaging scan showed excellent response    09/18/2015 Imaging   CT Head w/wo Contrast 1. No acute intracranial abnormality or significant interval change. 2. Stable minimal periventricular white matter hypoattenuation on the right. This may reflect a remote ischemic injury. 3. No evidence for metastatic disease to the brain. 4. No focal soft tissue lesion to explain the patient's tenderness.   10/30/2015 Imaging  CT Abdomen Pelvis w/  Contrast 1. Few mildly prominent fluid-filled loops of small bowel scattered throughout the abdomen, with intraluminal fluid density within the distal colon. Given the provided history, findings are suggestive of acute enteritis/diarrheal illness. 2. No other acute intra-abdominal or pelvic process identified. 3. Widespread osseous metastatic disease, better evaluated on most recent PET-CT from 07/19/2015. No pathologic fracture or other complication. 4. 11 mm hypodensity within the left kidney. This lesion measures intermediate density, and is indeterminate. While this lesion is similar in size relative to recent studies, this is increased in size relative to prior study from 2012. Further evaluation with dedicated renal mass protocol CT and/or MRI is recommended for complete characterization.   12/2015 - 05/14/2016 Chemotherapy   Maintenance Herceptin and pejeta every 3 weeks stopped on 05/14/16 due to disease progression   12/25/2015 Imaging   Restaging PET Scan 1. Markedly improved skeletal activity, with only several faint foci of residual accentuated metabolic activity, but resolution of the vast majority of the previously extensive osseous metastatic disease. 2. Resolution of the prior right internal mammary and right prevascular lymph nodes. 3. Residual subcutaneous edema and edema along fascia planes in the left upper thigh. This remains somewhat more than I would expect for placement of an IM nail 6 weeks ago. If there is leg swelling further down, consider Doppler venous ultrasound to rule out left lower extremity DVT. I do not perceive an obvious difference in density between the pelvic and common femoral veins on the noncontrast CT data. 4. Chronic right maxillary and right sphenoid sinusitis.   01/16/2016 - 10/20/2017 Anti-estrogen oral therapy   Letrozole 2.5 mg daily. She was switched to exemestane due to disease progression on 02/08/17. Stopped on 10/20/2017 when treatment changed to chemo     03/01/2016 Miscellaneous   Patient presented to ED following a fall; she reports she landed on her back and is now experiencing back pain. The patient was evaluated and discharge home same day with pain control.   05/01/2016 Imaging   CT CAP IMPRESSION: 1. Stable exam.  No new or progressive disease identified. 2. No evidence for residual or recurrent adenopathy within the chest. 3. Stable bone metastasis.   05/01/2016 Imaging   BONE SCAN IMPRESSION: 1. Small focal uptake involving the anterior rib ends of the left second and right fourth ribs, new since the prior bone scan. The location of this uptake is more suggestive of a traumatic or inflammatory etiology as opposed to metastatic disease. No other evidence of metastatic disease. 2. There are other areas of stable uptake which are most likely degenerative/reactive, including right shoulder and cervical spine uptake and uptake along the right femur adjacent to the ORIF hardware.   05/20/2016 Imaging   NM PET Skull to Thigh IMPRESSION: 1. There multiple metastatic foci in the bony pelvis, including some new abnormal foci compare to the prior PET-CT, compatible with mildly progressive bony metastatic disease. Also, a left T11 vertebral hypermetabolic metastatic lesion is observed, new compared to the prior exam. 2. No active extraosseous malignancy is currently identified. 3. Right anterior fourth rib healing fracture, appears be   06/04/2016 - 01/08/2017 Chemotherapy   Second line chemotherapy Kadcyla every 3 weeks started on 06/04/16 and stopped on 01/08/17 due to mixed response.     10/05/2016 Imaging   CT CAP and Bone Scan:  Bones: left intramedullary rod and left femoral head neck screw in place. In the proximal diaphysis of the left femur there is cortical thickening and   irregularity. No lytic or blastic bone lesions. No fracture or vertebral endplate destruction.   No definite evidence of recurrent disease in the  chest, abdomen, or pelvis.   10/28/2016 Imaging   CT A/P IMPRESSION: No acute process demonstrated in the abdomen or pelvis. No evidence of bowel obstruction or inflammation.   11/04/2016 Imaging   DG foot complete left: IMPRESSION: No fracture or dislocation.  No soft tissue abnormality   11/23/2016 Imaging   CT RIGHT FEMUR IMPRESSION: Negative CT scan of the right thigh.  No visible metastatic disease.  CT LEFT FEMUR IMPRESSION: 1. Postsurgical changes related to prior cephalomedullary rod fixation of the left femur with linear lucency along the anterolateral proximal femoral cortex at level of the lesser trochanter, suspicious for nondisplaced fracture. 2. Slightly permeative appearance of the proximal left femoral cortex at the site of known prior osseous metastases is largely unchanged. 3. Unchanged patchy lucency and sclerosis along the anterior acetabulum, which appears to correspond with an area of faint uptake on the prior PET-CT, suspicious for osseous metastasis. These results will be called to the ordering clinician or representative by the Radiologist Assistant, and communication documented in the PACS or zVision Dashboard.     01/27/2017 Imaging   IMPRESSION: 1. Mixed response to osseous metastasis. Some hypermetabolic lesions are new and increasingly hypermetabolic, while another index lesion is decreasingly hypermetabolic. 2. No evidence of hypermetabolic soft tissue metastasis.    02/08/2017 - 10/19/2017 Antibody Plan   -Herceptin every 3 week since 02/08/17 -Oral Lanpantinib 1562m and decreased to 5045mdue to diarrhea and depression. Due to disease progression she swithced to Verzenio 15035mn 05/04/17. She did not toelrate well so she was switched to Ibrance 100m50m 05/31/17. Due to her neutropenia, Ibrance dose reduced to 75 mg daily, 3 weeks on, one-week off, stopped on 10/20/2017 due to disease progression.    04/30/2017 PET scan   IMPRESSION: 1.  Primarily increase in hypermetabolism of osseous metastasis. An isolated right acetabular lesion is decreased in hypermetabolism since the prior exam. 2. No evidence of hypermetabolic soft tissue metastasis. 3. Similar non FDG avid right-sided thyroid nodule, favoring a benign etiology. Recommend attention on follow-up.   08/30/2017 PET scan   08/30/2017 PET Scan  IMPRESSION: 1. Worsening osseous metastatic disease. 2. Focal hypermetabolism in the central spinal canal the L1 level, similar to the prior exam. Metastatic implant cannot be excluded. 3. Slight enlargement of a right thyroid nodule which now appears more cystic in character. Consider further evaluation with thyroid ultrasound. If patient is clinically hyperthyroid, consider nuclear medicine thyroid uptake and scan   08/30/2017 Progression   08/30/2017 PET Scan  IMPRESSION: 1. Worsening osseous metastatic disease. 2. Focal hypermetabolism in the central spinal canal the L1 level, similar to the prior exam. Metastatic implant cannot be excluded. 3. Slight enlargement of a right thyroid nodule which now appears more cystic in character. Consider further evaluation with thyroid ultrasound. If patient is clinically hyperthyroid, consider nuclear medicine thyroid uptake and scan   09/29/2017 - 10/11/2017 Radiation Therapy   Pt received 27 Gy in 9 fractions directed at the areas of significant uptake noted on recent bone scan the right pelvis and right proximal femur, with Dr, KinaSondra Come10/09/2017 - 04/07/2018 Chemotherapy   -Herceptin every 3 weeks starting 02/08/17, perjeta added on 10/20/2017  -weekly Taxol started on 10/21/2017. Will reduce Taxol to 2 weeks on, 1 week off starting on 02/04/17.  Stoped due to mild disease progression  10/27/2017 Imaging   10/27/2017 CT AP IMPRESSION: 1. No acute abnormality. No evidence of bowel obstruction or acute bowel inflammation. Normal appendix. 2. Stable patchy sclerotic osseous  metastases throughout the visualized skeleton. No new or progressive metastatic disease in the abdomen or pelvis.   01/11/2018 Imaging   Whole Body Bone Scan 01/11/18  IMPRESSION: 1. Multifocal osseous metastases as described. 2. Right scapular metastasis. 3. Right superior thoracic spinous process. 4. Right sacral ala and right L5 metastases. 5. Right acetabular metastasis. 6. Right proximal femur metastasis. 7. Diffuse metastases throughout the left femur. 8. Anterior right fourth rib metastasis.   01/11/2018 Imaging   CT CAP W Contrast 01/11/18  IMPRESSION: 1. Similar-appearing osseous metastatic disease. 2. No evidence for progressed metastatic disease in the chest, abdomen or pelvis.   03/30/2018 Imaging   Bone Scan 03/30/18  IMPRESSION: Stable multifocal bony metastatic disease.   03/30/2018 Imaging    CT CAP W Contrast 03/30/18 IMPRESSION: 1. New mild contiguous wall thickening with associated mucosal hyperenhancement throughout the left colon and rectum, suggesting nonspecific infectious or inflammatory proctocolitis. Differential includes C diff colitis. 2. Stable faint patchy sclerosis in the iliac bones and lumbar vertebral bodies. No new or progressive osseous metastatic disease by CT. Please note that the osseous lesions are relatively occult by CT and would be better evaluated by PET-CT, as clinically warranted. 3. No evidence of extra osseous metastatic disease.  These results will be called to the ordering clinician or representative by the Radiologist Assistant, and communication documented in the PACS or zVision Dashboard.   04/15/2018 - 06/16/2018 Chemotherapy   Enhertu q3weeks starting 04/15/18. Stopped Enhertu after cycle 3 on 05/27/18 due to poor toleration due to poor toleration with anorexia, weight loss, N&V, diarrhea   04/21/2018 PET scan   PET 04/21/18  IMPRESSION: 1. Overall marked interval improvement in bony hypermetabolic disease. All of the  previously identified hypermetabolic osseous metastases have decreased in hypermetabolism in the interval. There is a new tiny focus of hypermetabolism in the right aspect of the L3 vertebral body that appears to correspond to a new 8 mm lucency, raising concern for new metastatic disease. Close attention on follow-up recommended. 2. Focal hypermetabolism in the central spinal canal at the level of L1 previously has resolved. 3. No hypermetabolic soft tissue disease on today's study. 4. Right thyroid nodule seen previously has decreased in the interval.    06/16/2018 Imaging   CT LUMBAR SPINE WO CONTRAST 06/16/18 IMPRESSION: 1. Transitional lumbosacral anatomy. Correlation with radiographs is recommended prior to any operative intervention. 2. Suspicion of a right L3 vertebral body metastasis on the April PET-CT is not evident by CT. Stable degenerative versus metastatic lesion at the right L5 superior endplate. Small but increased right iliac bone metastasis since March. Lumbar MRI would best characterize lumbar metastatic disease (without contrast if necessary). 3. Suspect L2-L3 left subarticular segment disc herniation. Query left L2 radiculitis. 4. Stable appearance of rightward L4-L5 disc degeneration which could affect the right foramen and right lateral recess.   06/17/2018 - 11/16/2018 Chemotherapy   Restart Taxol 2 weeks on/1 week off with Herceptin and Perjeta q3weeks on 06/17/18. Perjeta was stopped after C5 due to diarrhea. Stopped Taxol after 11/16/18 due to disease progression to L3.    07/06/2018 PET scan   PET  IMPRESSION: 1. Near total resolution of hypermetabolic activity associated with skeletal metastasis. Very mild activity associated with the LEFT iliac bone which is similar to background blood pool activity. 2. No new   or progressive disease.   11/21/2018 PET scan   IMPRESSION: 1. New focal hypermetabolic lesion eccentric to the right in the L3 vertebral body,  maximum SUV 6.1 compatible with a new focus of active malignancy. 2. Very low-grade activity similar to blood pool in previous existing mildly sclerotic metastatic lesions including the left iliac bone lesion. Some of the mildly sclerotic lesions are not hypermetabolic compatible with effectively treated bony metastatic disease. 3. No hypermetabolic soft tissue lesions are identified.     11/30/2018 - 08/25/2019 Chemotherapy   Continue Herceptin q3weeks. Stopped 08/25/19 due to significant disease progression and switched to  IV Matuzumab   12/12/2018 - 12/17/2018 Chemotherapy   Tucatinib (TUKYSA) 6 tabs BID as a next line therapy starting 12/12/18. Stopped 12/17/18 duet o N&V, poor appetite and HA    12/21/2018 - 03/17/2019 Chemotherapy   IV Eribulin on day 1, 8 every 21 days starting 12/21/18. Stopped 03/17/19 due to mixed response to treatment.    03/21/2019 PET scan   IMPRESSION: 1. Mixed response to therapy of osseous metastasis. Although a right-sided L4 lesion demonstrates decreased hypermetabolism today, new left iliac and right ischial hypermetabolic lesions are seen. 2. No evidence of hypermetabolic soft tissue metastasis.     03/27/2019 - 07/21/2019 Chemotherapy   Neratinib (Nerlynx) titrate up from 152m to 2479mstarting 03/27/19. She could not tolerate 16050mnd stopped on 02/05/19 due to N&V and diarrhea. Will restart at 120m51m 04/10/18-04/27/19 and 4/19-4/22. Reduced to 80mg58me daily on 05/10/19. Held/stopped since 07/21/19 due to dry cough, Nausea and body aches.   06/23/2019 Imaging   CT Head  IMPRESSION: 1. No acute intracranial abnormality or metastatic disease identified. 2. Chronic white matter disease in the right hemisphere is stable from prior exams.     09/05/2019 PET scan   IMPRESSION: 1. Significant progression of widespread hypermetabolic osseous metastases throughout the axial skeleton as detailed. 2. No hypermetabolic extraosseous sites of metastatic disease.    09/15/2019 - 12/22/2019 Chemotherapy   -IV Margetuximab q3weeks and IV Gemcitabine 2 weeks on/1 week off starting 09/15/19. Carboplatin added to Gemcitabine starting with C3 on 11/03/19. D/c after C5 due to disease progression in bone.    12/13/2019 Imaging   PET  IMPRESSION: 1. Interval progression of widespread, FDG avid osseous metastasis involving the axial skeleton. 2. No FDG avid extra osseous sites of metastatic disease.     01/19/2020 -  Chemotherapy   Xeloda BID 2 weeks on/1 week off and Herpcetin q3weeks starting 01/19/20  with C1 at 1000mg 68mand will increase to 1500mg B47mf tolerable.       CURRENT THERAPY:  -Zometa q3months60monthNG Xeloda BID 2 weeks on/1 week off and Herpcetin q3weeks starting 01/19/20. C1 at 1000mg BID52m will increase to 1500mg BID 95molerable.   INTERVAL HISTORY:  Rasheida C EtVALISA KARPELor a follow up. She presents to the clinic with her interpretor and husband. She notes she felt dizzy on recent trip and has felt aching all over which effects her sleep. She notes she is eating adequately. She notes she feels ready to start Xeloda today. She notes she still has bruise and mild soreness of left arm from Onpro patch last cycle chemo. I reviewed her medication list with her. She is taking Oxycodone 1-2 times a day. Her pain remains stable.   She has received both COVID vaccines, last in 11/2019 and is interested in the booster. She plans to take it in April.   REVIEW  OF SYSTEMS:   Constitutional: Denies fevers, chills or abnormal weight loss Eyes: Denies blurriness of vision Ears, nose, mouth, throat, and face: Denies mucositis or sore throat Respiratory: Denies cough, dyspnea or wheezes Cardiovascular: Denies palpitation, chest discomfort or lower extremity swelling Gastrointestinal:  Denies nausea, heartburn or change in bowel habits Skin: Denies abnormal skin rashes Lymphatics: Denies new lymphadenopathy or easy bruising Neurological:Denies  numbness, tingling or new weaknesses Behavioral/Psych: Mood is stable, no new changes  All other systems were reviewed with the patient and are negative.  MEDICAL HISTORY:  Past Medical History:  Diagnosis Date  . Abnormal Pap smear   . Anxiety   . Breast cancer ([place])   . Cancer ([place]) 1999   breast-s/p mastectomy, chemo, rad  . CIN I (cervical intraepithelial neoplasia I) 2003   by colpo  . Congenital deafness   . Deaf   . Depression   . Port-A-Cath in place 2017   for chemotherapy  . Radiation 07/12/15-07/26/15   left femur 30 Gy  . S/P bilateral oophorectomy     SURGICAL HISTORY: Past Surgical History:  Procedure Laterality Date  . ABDOMINAL HYSTERECTOMY    . INTRAMEDULLARY (IM) NAIL INTERTROCHANTERIC Left 06/26/2015   Procedure: LEFT BIOMET LONG AFFIXS NAIL;  Surgeon: [name], MD;  Location: [facility];  Service: Orthopedics;  Laterality: Left;  . IR GENERIC HISTORICAL  08/06/2015   IR US GUIDE VASC ACCESS LEFT 08/06/2015 Aletta Edouard, MD [facility]  . IR GENERIC HISTORICAL  08/06/2015   IR FLUORO GUIDE CV LINE LEFT 08/06/2015 Aletta Edouard, MD [facility]  . IR GENERIC HISTORICAL  03/05/2016   IR CV LINE INJECTION 03/05/2016 Markus Daft, MD [facility]  . LEFT HEART CATH AND CORONARY ANGIOGRAPHY N/A 12/13/2017   Procedure: LEFT HEART CATH AND CORONARY ANGIOGRAPHY;  Surgeon: Leonie Man, MD;  Location: [facility];  Service: Cardiovascular;  Laterality: N/A;  . MASTECTOMY     right breast  . MASTECTOMY    . OVARIAN CYST REMOVAL    . RADIOLOGY WITH ANESTHESIA Left 06/25/2015   Procedure: MRI OF LEFT HIP WITH OR WITHOUT CONTRAST;  Surgeon: Medication Radiologist, MD;  Location: [facility];  Service: Radiology;  Laterality: Left;  DR. MCINTYRE/MRI  . TUBAL LIGATION      I have reviewed the social history and family history with the patient and they are unchanged from previous note.  ALLERGIES:  is allergic to promethazine hcl, chlorhexidine, and diphenhydramine  hcl.  MEDICATIONS:  Current Outpatient Medications  Medication Sig Dispense Refill  . ALPRAZolam (XANAX) 0.25 MG tablet Take 1 tablet (0.25 mg total) by mouth 2 (two) times daily as needed for anxiety. 30 tablet 0  . capecitabine (XELODA) 500 MG tablet Take 2 tablets (1,000 mg total) by mouth 2 (two) times daily after a meal. Take for 14 days on, 7 days off. Repeat every 21 days. 56 tablet 0  . diphenoxylate-atropine (LOMOTIL) 2.5-0.025 MG tablet Take 1-2 tablets by mouth 4 (four) times daily as needed for diarrhea or loose stools. 60 tablet 1  . guaiFENesin-codeine 100-10 MG/5ML syrup Take 5 mLs by mouth every 4 (four) hours as needed for cough. 180 mL 0  . lidocaine-prilocaine (EMLA) cream Apply 1 application topically as needed. Apply to Porta-Cath 1-2 hours prior to access as directed. 90 g 2  . ondansetron (ZOFRAN) 8 MG tablet Take 1 tablet (8 mg total) by mouth every 8 (eight) hours as needed for nausea or vomiting. 45 tablet 1  .  oxyCODONE (ROXICODONE) 5 MG immediate release tablet Take 1 tablet (5 mg total) by mouth every 8 (eight) hours as needed for severe pain. 60 tablet 0  . potassium chloride (KLOR-CON) 10 MEQ tablet Take 3 tablets (30 mEq total) by mouth daily. 90 tablet 2  . prochlorperazine (COMPAZINE) 10 MG tablet Take 1 tablet (10 mg total) by mouth 2 (two) times daily as needed for nausea or vomiting. 20 tablet 1  . zolpidem (AMBIEN) 10 MG tablet Take 1 tablet (10 mg total) by mouth at bedtime as needed for sleep. 15 tablet 2   No current facility-administered medications for this visit.   Facility-Administered Medications Ordered in Other Visits  Medication Dose Route Frequency Provider Last Rate Last Admin  . potassium chloride SA (K-DUR) CR tablet 40 mEq  40 mEq Oral Once Truitt Merle, MD      . sodium chloride 0.9 % 1,000 mL with potassium chloride 10 mEq infusion   Intravenous Continuous Gordy Levan, MD   Stopped at 09/10/15 1653  . sodium chloride 0.9 % injection 10  mL  10 mL Intravenous PRN Livesay, Lennis P, MD      . sodium chloride flush (NS) 0.9 % injection 10 mL  10 mL Intracatheter PRN Truitt Merle, MD   10 mL at 01/19/20 1135    PHYSICAL EXAMINATION: ECOG PERFORMANCE STATUS: 2 - Symptomatic, <50% confined to bed  Vitals:   01/19/20 0831  BP: 111/73  Pulse: 82  Resp: 17  Temp: (!) 97.1 F (36.2 C)  SpO2: 100%   Filed Weights   01/19/20 0831  Weight: 131 lb 3.2 oz (59.5 kg)    GENERAL:alert, no distress and comfortable SKIN: skin color, texture, turgor are normal, no rashes or significant lesions EYES: normal, Conjunctiva are pink and non-injected, sclera clear  NECK: supple, thyroid normal size, non-tender, without nodularity LYMPH:  no palpable lymphadenopathy in the cervical, axillary  LUNGS: clear to auscultation and percussion with normal breathing effort HEART: regular rate & rhythm and no murmurs and no lower extremity edema ABDOMEN:abdomen soft, non-tender and normal bowel sounds Musculoskeletal:no cyanosis of digits and no clubbing  NEURO: alert & oriented x 3 with fluent speech, no focal motor/sensory deficits  LABORATORY DATA:  I have reviewed the data as listed CBC Latest Ref Rng & Units 01/19/2020 12/29/2019 12/27/2019  WBC 4.0 - 10.5 K/uL 3.8(L) 15.9(H) 11.5(H)  Hemoglobin 12.0 - 15.0 g/dL 9.7(L) 7.2(L) 8.2(L)  Hematocrit 36.0 - 46.0 % 30.0(L) 21.9(L) 23.9(L)  Platelets 150 - 400 K/uL 192 27(L) 45(L)     CMP Latest Ref Rng & Units 01/19/2020 12/29/2019 12/27/2019  Glucose 70 - 99 mg/dL 89 97 96  BUN 6 - 20 mg/dL _0 Creatinine 0.44 - 1.00 mg/dL 0.87 0.81 0.88  Sodium 135 - 145 mmol/L 140 139 141  Potassium 3.5 - 5.1 mmol/L 3.6 3.5 3.5  Chloride 98 - 111 mmol/L 106 106 108  CO2 22 - 32 mmol/L 24 20(L) 25  Calcium 8.9 - 10.3 mg/dL 9.6 8.7(L) 9.4  Total Protein 6.5 - 8.1 g/dL 7.0 6.3(L) 6.9  Total Bilirubin 0.3 - 1.2 mg/dL 1.1 0.9 1.8(H)  Alkaline Phos 38 - 126 U/L 84 121 125  AST 15 - 41 U/L _1 ALT 0  - 44 U/L 12 29 47(H)      RADIOGRAPHIC STUDIES: I have personally reviewed the radiological images as listed and agreed with the findings in the report. No results found.   ASSESSMENT &  PLAN:  [name] is a 55 y.o. 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 She had right mastectomy, 4 cycles of AC-T and 5 years of Tamoxifen/AI.  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 -After her first line ofchemotreatment and initial maintenance therapy, she progressed or had poor toleration of multiple lines of treatment since then. -Based on 12/13/19 PET, she recently progressed diffusely in the bone on 7th linetherapywith anti-HER2IV Margetuximab q3weeks withIV Chemo weeklyGemcitabine(Carboplatin added C3) 2 weeks on/1 week off after 5 cycles, 09/15/19-12/22/19. -I previously discussed the longer she is on treatment the less treatment options and efficacy she will have. I discussed she has triedmost availableanti-HER2antibodiesand has some chemotherapy options remaining such as Docetaxel or oral Xeloda with Herceptin. She is reluctant to proceed with Docetaxel due to prior side effects. Plan to start Xeloda 1061m BID 2 weeks on/1 week off for first cycle. If tolerating well will increase to 15063mBID. Continue Herceptin q3weeks. Plan to start today (01/19/19).  -Labs reviewed, WBC 3.8, Hg 9.7. Overall adequate to proceed with Herceptin infusion today and continue every 3 weeks. Will start Xeloda today at 100085mID. I reviewed possible side effects with her and encouraged her to contact if she has significant or unexpected side effects. Pt feels she can swallow pill, but if not she has directions on dissolving it.  -F/u next week for toxicity check.   2.Body pain, secondary to bone mets, Hypocalcemia, leg cramps  -She is being treated  with Zometa q3mo60monthxt injection in 01/2020 -She will continue oral calcium and Vit Dand Prilosec for Acid Reflux. -Current pain includes, lower back, upper legs, hips and rightshoulder.PET12/3/21does indicate disease progression in her spine, pelvis and right scapula. -Pain iswellcontrolled on 5mg 30mcodone1-2 tabdaily.  3.Congenital deafness  4.Mild anemia, secondary to chemotherapy and malignancy -Moderate andStable   5.Hypokalemia, Hypomagnesium, secondary toDiarrhea -She developed diarrhea s/p cycle 3 Perjetaand again with Nerlynx, Both have been D/c. -She is onOral potassium 10meq49mShe stopped oral Mag because it made her nauseous.  -She can take more potassium when she has diarrhea.  6.Goal of care discussion, DNR/DNI -Wepreviouslyreviewed goal of care andI recommend DNR/DNI, she previouslyagreed.  -I encouraged her to start setting up her living will.We previously discussed hospice care if she has no more treatment available or could not tolerate more treatment  7.Insomnia -She cannot take Benadryl due to allergy reaction. She has tried trazodone with no relief.  -She is on Xanax and Ambien10mgat64mf tablets nightly. She knows not to take both at the same time. If she needs Oxycodone at night, she can use half tablet and an Ambien which helps her sleep.   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 Stable  9. Mildhyperbilirubinemia  -likely second to chemotherapy, fluctuates  -Will monitor closely. Stable.   PLAN: -I refilled Oxycodone #60 tabs today  -Labs reviewed and adequate to proceed with Herceptin today -Start C1D1 Xeloda today at 1000mg BI38mr 2 weeks on/1 week off, pt states she is able to swallow the pills  -Lab, flush and F/u in one week -Lab, flush, F/u and Herceptin in 3 weeks    No problem-specific Assessment & Plan notes found for this encounter.   No orders of the defined types were placed in this  encounter.  All questions were answered. The patient knows to call the clinic with any problems, questions or  concerns. No barriers to learning was detected. The total time spent in the appointment was 30 minutes.     Truitt Merle, MD 01/19/2020   I, Joslyn Devon, am acting as scribe for Truitt Merle, MD.   I have reviewed the above documentation for accuracy and completeness, and I agree with the above.

## 2020-01-19 ENCOUNTER — Encounter: Payer: Self-pay | Admitting: Hematology

## 2020-01-19 ENCOUNTER — Inpatient Hospital Stay: Payer: Medicaid Other

## 2020-01-19 ENCOUNTER — Other Ambulatory Visit: Payer: Self-pay

## 2020-01-19 ENCOUNTER — Inpatient Hospital Stay (HOSPITAL_BASED_OUTPATIENT_CLINIC_OR_DEPARTMENT_OTHER): Payer: Medicaid Other | Admitting: Hematology

## 2020-01-19 ENCOUNTER — Inpatient Hospital Stay: Payer: Medicaid Other | Attending: Hematology

## 2020-01-19 VITALS — BP 111/73 | HR 82 | Temp 97.1°F | Resp 17 | Ht 65.0 in | Wt 131.2 lb

## 2020-01-19 DIAGNOSIS — C50911 Malignant neoplasm of unspecified site of right female breast: Secondary | ICD-10-CM

## 2020-01-19 DIAGNOSIS — C7951 Secondary malignant neoplasm of bone: Secondary | ICD-10-CM | POA: Diagnosis not present

## 2020-01-19 DIAGNOSIS — Z170 Estrogen receptor positive status [ER+]: Secondary | ICD-10-CM | POA: Diagnosis not present

## 2020-01-19 DIAGNOSIS — Z452 Encounter for adjustment and management of vascular access device: Secondary | ICD-10-CM | POA: Diagnosis not present

## 2020-01-19 DIAGNOSIS — D6481 Anemia due to antineoplastic chemotherapy: Secondary | ICD-10-CM | POA: Insufficient documentation

## 2020-01-19 DIAGNOSIS — Z95828 Presence of other vascular implants and grafts: Secondary | ICD-10-CM

## 2020-01-19 DIAGNOSIS — G4700 Insomnia, unspecified: Secondary | ICD-10-CM | POA: Diagnosis not present

## 2020-01-19 DIAGNOSIS — E876 Hypokalemia: Secondary | ICD-10-CM | POA: Diagnosis not present

## 2020-01-19 DIAGNOSIS — H905 Unspecified sensorineural hearing loss: Secondary | ICD-10-CM | POA: Diagnosis not present

## 2020-01-19 DIAGNOSIS — Z5112 Encounter for antineoplastic immunotherapy: Secondary | ICD-10-CM | POA: Diagnosis present

## 2020-01-19 DIAGNOSIS — G620 Drug-induced polyneuropathy: Secondary | ICD-10-CM | POA: Insufficient documentation

## 2020-01-19 LAB — CBC WITH DIFFERENTIAL (CANCER CENTER ONLY)
Abs Immature Granulocytes: 0.02 10*3/uL (ref 0.00–0.07)
Basophils Absolute: 0 10*3/uL (ref 0.0–0.1)
Basophils Relative: 1 %
Eosinophils Absolute: 0.1 10*3/uL (ref 0.0–0.5)
Eosinophils Relative: 2 %
HCT: 30 % — ABNORMAL LOW (ref 36.0–46.0)
Hemoglobin: 9.7 g/dL — ABNORMAL LOW (ref 12.0–15.0)
Immature Granulocytes: 1 %
Lymphocytes Relative: 23 %
Lymphs Abs: 0.9 10*3/uL (ref 0.7–4.0)
MCH: 31.7 pg (ref 26.0–34.0)
MCHC: 32.3 g/dL (ref 30.0–36.0)
MCV: 98 fL (ref 80.0–100.0)
Monocytes Absolute: 0.4 10*3/uL (ref 0.1–1.0)
Monocytes Relative: 11 %
Neutro Abs: 2.4 10*3/uL (ref 1.7–7.7)
Neutrophils Relative %: 62 %
Platelet Count: 192 10*3/uL (ref 150–400)
RBC: 3.06 MIL/uL — ABNORMAL LOW (ref 3.87–5.11)
RDW: 14.2 % (ref 11.5–15.5)
WBC Count: 3.8 10*3/uL — ABNORMAL LOW (ref 4.0–10.5)
nRBC: 0 % (ref 0.0–0.2)

## 2020-01-19 LAB — CMP (CANCER CENTER ONLY)
ALT: 12 U/L (ref 0–44)
AST: 17 U/L (ref 15–41)
Albumin: 3.6 g/dL (ref 3.5–5.0)
Alkaline Phosphatase: 84 U/L (ref 38–126)
Anion gap: 10 (ref 5–15)
BUN: 12 mg/dL (ref 6–20)
CO2: 24 mmol/L (ref 22–32)
Calcium: 9.6 mg/dL (ref 8.9–10.3)
Chloride: 106 mmol/L (ref 98–111)
Creatinine: 0.87 mg/dL (ref 0.44–1.00)
GFR, Estimated: >60 mL/min (ref >60)
Glucose, Bld: 89 mg/dL (ref 70–99)
Potassium: 3.6 mmol/L (ref 3.5–5.1)
Sodium: 140 mmol/L (ref 135–145)
Total Bilirubin: 1.1 mg/dL (ref 0.3–1.2)
Total Protein: 7 g/dL (ref 6.5–8.1)

## 2020-01-19 MED ORDER — ZOLEDRONIC ACID 4 MG/100ML IV SOLN
4.0000 mg | Freq: Once | INTRAVENOUS | Status: AC
  Administered 2020-01-19: 4 mg via INTRAVENOUS

## 2020-01-19 MED ORDER — SODIUM CHLORIDE 0.9% FLUSH
10.0000 mL | Freq: Once | INTRAVENOUS | Status: AC
  Administered 2020-01-19: 10 mL
  Filled 2020-01-19: qty 10

## 2020-01-19 MED ORDER — TRASTUZUMAB-DKST CHEMO 150 MG IV SOLR
450.0000 mg | Freq: Once | INTRAVENOUS | Status: AC
  Administered 2020-01-19: 450 mg via INTRAVENOUS
  Filled 2020-01-19: qty 21.43

## 2020-01-19 MED ORDER — LORATADINE 10 MG PO TABS
10.0000 mg | ORAL_TABLET | Freq: Once | ORAL | Status: AC
  Administered 2020-01-19: 10 mg via ORAL

## 2020-01-19 MED ORDER — ACETAMINOPHEN 325 MG PO TABS
650.0000 mg | ORAL_TABLET | Freq: Once | ORAL | Status: AC
Start: 2020-01-19 — End: 2020-01-19
  Administered 2020-01-19: 650 mg via ORAL

## 2020-01-19 MED ORDER — ACETAMINOPHEN 325 MG PO TABS
ORAL_TABLET | ORAL | Status: AC
  Filled 2020-01-19: qty 2

## 2020-01-19 MED ORDER — SODIUM CHLORIDE 0.9 % IV SOLN
Freq: Once | INTRAVENOUS | Status: AC
  Filled 2020-01-19: qty 250

## 2020-01-19 MED ORDER — ZOLEDRONIC ACID 4 MG/5ML IV CONC: 4.0000 mg | Freq: Once | INTRAVENOUS | Status: DC

## 2020-01-19 MED ORDER — OXYCODONE HCL 5 MG PO TABS: 5.0000 mg | ORAL_TABLET | Freq: Three times a day (TID) | ORAL | 0 refills | Status: DC | PRN

## 2020-01-19 MED ORDER — LORATADINE 10 MG PO TABS
ORAL_TABLET | ORAL | Status: AC
  Filled 2020-01-19: qty 1

## 2020-01-19 MED ORDER — HEPARIN SOD (PORK) LOCK FLUSH 100 UNIT/ML IV SOLN
500.0000 [IU] | Freq: Once | INTRAVENOUS | Status: AC | PRN
  Administered 2020-01-19: 500 [IU]
  Filled 2020-01-19: qty 5

## 2020-01-19 MED ORDER — ZOLEDRONIC ACID 4 MG/100ML IV SOLN
INTRAVENOUS | Status: AC
  Filled 2020-01-19: qty 100

## 2020-01-19 MED ORDER — DIPHENHYDRAMINE HCL 25 MG PO CAPS: 50.0000 mg | ORAL_CAPSULE | Freq: Once | ORAL | Status: DC

## 2020-01-19 MED ORDER — SODIUM CHLORIDE 0.9% FLUSH
10.0000 mL | INTRAVENOUS | Status: DC | PRN
  Administered 2020-01-19: 10 mL
  Filled 2020-01-19: qty 10

## 2020-01-19 NOTE — Patient Instructions (Addendum)
[facility] Discharge Instructions for Patients Receiving Chemotherapy  Today you received the following chemotherapy agents: trastuzumab.   To help prevent nausea and vomiting after your treatment, we encourage you to take your nausea medication as directed.   If you develop nausea and vomiting that is not controlled by your nausea medication, call the clinic.   BELOW ARE SYMPTOMS THAT SHOULD BE REPORTED IMMEDIATELY:  *FEVER GREATER THAN 100.5 F  *CHILLS WITH OR WITHOUT FEVER  NAUSEA AND VOMITING THAT IS NOT CONTROLLED WITH YOUR NAUSEA MEDICATION  *UNUSUAL SHORTNESS OF BREATH  *UNUSUAL BRUISING OR BLEEDING  TENDERNESS IN MOUTH AND THROAT WITH OR WITHOUT PRESENCE OF ULCERS  *URINARY PROBLEMS  *BOWEL PROBLEMS  UNUSUAL RASH Items with * indicate a potential emergency and should be followed up as soon as possible.  Feel free to call the clinic should you have any questions or concerns. The clinic phone number is [phone].  Please show the CHEMO ALERT CARD at check-in to the Emergency Department and triage nurse.  Zoledronic Acid injection (Hypercalcemia, Oncology) What is this medicine? ZOLEDRONIC ACID (ZOE le dron ik AS id) lowers the amount of calcium loss from bone. It is used to treat too much calcium in your blood from cancer. It is also used to prevent complications of cancer that has spread to the bone. This medicine may be used for other purposes; ask your health care provider or pharmacist if you have questions. COMMON BRAND NAME(S): Zometa What should I tell my health care provider before I take this medicine? They need to know if you have any of these conditions:  aspirin-sensitive asthma  cancer, especially if you are receiving medicines used to treat cancer  dental disease or wear dentures  infection  kidney disease  receiving corticosteroids like dexamethasone or prednisone  an unusual or allergic reaction to zoledronic acid, other  medicines, foods, dyes, or preservatives  pregnant or trying to get pregnant  breast-feeding How should I use this medicine? This medicine is for infusion into a vein. It is given by a health care professional in a hospital or clinic setting. Talk to your pediatrician regarding the use of this medicine in children. Special care may be needed. Overdosage: If you think you have taken too much of this medicine contact a poison control center or emergency room at once. NOTE: This medicine is only for you. Do not share this medicine with others. What if I miss a dose? It is important not to miss your dose. Call your doctor or health care professional if you are unable to keep an appointment. What may interact with this medicine?  certain antibiotics given by injection  NSAIDs, medicines for pain and inflammation, like ibuprofen or naproxen  some diuretics like bumetanide, furosemide  teriparatide  thalidomide This list may not describe all possible interactions. Give your health care provider a list of all the medicines, herbs, non-prescription drugs, or dietary supplements you use. Also tell them if you smoke, drink alcohol, or use illegal drugs. Some items may interact with your medicine. What should I watch for while using this medicine? Visit your doctor or health care professional for regular checkups. It may be some time before you see the benefit from this medicine. Do not stop taking your medicine unless your doctor tells you to. Your doctor may order blood tests or other tests to see how you are doing. Women should inform their doctor if they wish to become pregnant or think they might be   pregnant. There is a potential for serious side effects to an unborn child. Talk to your health care professional or pharmacist for more information. You should make sure that you get enough calcium and vitamin D while you are taking this medicine. Discuss the foods you eat and the vitamins you take  with your health care professional. Some people who take this medicine have severe bone, joint, and/or muscle pain. This medicine may also increase your risk for jaw problems or a broken thigh bone. Tell your doctor right away if you have severe pain in your jaw, bones, joints, or muscles. Tell your doctor if you have any pain that does not go away or that gets worse. Tell your dentist and dental surgeon that you are taking this medicine. You should not have major dental surgery while on this medicine. See your dentist to have a dental exam and fix any dental problems before starting this medicine. Take good care of your teeth while on this medicine. Make sure you see your dentist for regular follow-up appointments. What side effects may I notice from receiving this medicine? Side effects that you should report to your doctor or health care professional as soon as possible:  allergic reactions like skin rash, itching or hives, swelling of the face, lips, or tongue  anxiety, confusion, or depression  breathing problems  changes in vision  eye pain  feeling faint or lightheaded, falls  jaw pain, especially after dental work  mouth sores  muscle cramps, stiffness, or weakness  redness, blistering, peeling or loosening of the skin, including inside the mouth  trouble passing urine or change in the amount of urine Side effects that usually do not require medical attention (report to your doctor or health care professional if they continue or are bothersome):  bone, joint, or muscle pain  constipation  diarrhea  fever  hair loss  irritation at site where injected  loss of appetite  nausea, vomiting  stomach upset  trouble sleeping  trouble swallowing  weak or tired This list may not describe all possible side effects. Call your doctor for medical advice about side effects. You may report side effects to FDA at 1-800-FDA-1088. Where should I keep my medicine? This drug  is given in a hospital or clinic and will not be stored at home. NOTE: This sheet is a summary. It may not cover all possible information. If you have questions about this medicine, talk to your doctor, pharmacist, or health care provider.  2020 [employer] Standard (2013-05-27 14:19:39)

## 2020-01-20 LAB — CANCER ANTIGEN 27.29: CA 27.29: 110.9 U/mL — ABNORMAL HIGH (ref 0.0–38.6)

## 2020-01-23 ENCOUNTER — Telehealth: Payer: Self-pay | Admitting: Hematology

## 2020-01-23 NOTE — Telephone Encounter (Signed)
Scheduled appts per 1/7 los. Pt confirmed appt date and time.  

## 2020-01-24 NOTE — Progress Notes (Signed)
[place]   [phone] [phone]   Clinic Follow up Note   Patient Care Team: [name], [name], [name] as PCP - General (Physician Assistant) [name], MD as Consulting Physician (Orthopedic Surgery)  Date of Service:  01/26/2020  CHIEF COMPLAINT: f/u of metastatic breast cancer  SUMMARY OF ONCOLOGIC HISTORY: Oncology History Overview Note   Cancer Staging No matching staging information was found for the patient.     [name], right ([place])  11/1997 Cancer Diagnosis   Patient had 1.4 cm poorly differentiated right breast carcinoma diagnosed in Nov 1999 at age 35, 1/7 axillary nodes involved, ER/PR and HER 2 positive. She had mastectomy with the 7 axillary node evaluation, 4 cycles of adriamycin/ cytoxan followed by taxotere, then five years of tamoxifen thru June 2005 (no herceptin in 1999). She had bilateral oophorectomy in May 2005. She was briefly on aromatase inhibitor after tamoxifen, but discontinued this herself due to poor tolerance.   04/04/2015 Genetic Testing   Negative genetic testing on the breast/ovarian cancer pnael.  The Breast/Ovarian gene panel offered by [facility] includes sequencing and rearrangement analysis for the following 20 genes:  ATM, BARD1, BRCA1, BRCA2, BRIP1, CDH1, CHEK2, EPCAM, FANCC, MLH1, MSH2, MSH6, NBN, PALB2, PMS2, PTEN, RAD51C, RAD51D, TP53, and XRCC2.   06/26/2015 Pathology Results   Initial Path Report Bone, curettage, Left femur met, intramedullary subtroch tissue - METASTATIC ADENOCARCINOMA.  Estrogen Receptor: 95%, POSITIVE, STRONG STAINING INTENSITY Progesterone Receptor: 2%, POSITIVE, STRONG  HER2 (+), IHC 3+   06/26/2015 Surgery   Biopsy of metastatic tissue and stabilization with Affixus trochanteric nail, proximal and distal interlock of left femur by Dr. [name]    06/28/2015 Imaging   CT Chest w/ Contrast 1. Extensive right internal mammary chain  lymphadenopathy consistent with metastatic breast cancer. 2. Lytic metastases involving the posterior elements at T1 with probable nondisplaced pathologic fracture of the spinous process. 3. No other evidence of thoracic metastatic disease. 4. Trace bilateral pleural effusions with associated bibasilar atelectasis. 5. Indeterminate right thyroid nodule, not previously imaged. This could be evaluated with thyroid ultrasound as clinically warranted.   06/29/2015 -  Chemotherapy   Zometa started monthly on 06/29/15 and switched to q10month from 01/08/17   07/01/2015 Imaging   Bone Scan 1. Increased activity noted throughout the left femur. Although metastatic disease cannot be excluded, these changes are most likely secondary to prior surgery. 2. No other focal abnormalities identified to suggest metastatic disease.   07/12/2015 - 07/26/2015 Radiation Therapy   Left femur, 30 Gy in 10 fractions by Dr. [name]   07/14/2015 Initial Diagnosis   [name], right ([place]   07/19/2015 Imaging   Initial PET Scan 1. Severe multifocal osseous metastatic disease, much greater than anticipated based on prior imaging. 2. Multifocal nodal metastases involving the right internal mammary, prevascular and subpectoral lymph nodes. No axillary pulmonary involvement identified. 3. No distant extra osseous metastases.   07/30/2015 - 11/29/2015 Chemotherapy   Docetaxel, Herceptin and Perjeta every 3 weeks X6 cycles, pt tolerated moderately well, restaging scan showed excellent response    09/18/2015 Imaging   CT Head w/wo Contrast 1. No acute intracranial abnormality or significant interval change. 2. Stable minimal periventricular white matter hypoattenuation on the right. This may reflect a remote ischemic injury. 3. No evidence for metastatic disease to the brain. 4. No focal soft tissue lesion to explain the patient's tenderness.   10/30/2015 Imaging  CT Abdomen Pelvis w/  Contrast 1. Few mildly prominent fluid-filled loops of small bowel scattered throughout the abdomen, with intraluminal fluid density within the distal colon. Given the provided history, findings are suggestive of acute enteritis/diarrheal illness. 2. No other acute intra-abdominal or pelvic process identified. 3. Widespread osseous metastatic disease, better evaluated on most recent PET-CT from 07/19/2015. No pathologic fracture or other complication. 4. 11 mm hypodensity within the left kidney. This lesion measures intermediate density, and is indeterminate. While this lesion is similar in size relative to recent studies, this is increased in size relative to prior study from 2012. Further evaluation with dedicated renal mass protocol CT and/or MRI is recommended for complete characterization.   12/2015 - 05/14/2016 Chemotherapy   Maintenance Herceptin and pejeta every 3 weeks stopped on 05/14/16 due to disease progression   12/25/2015 Imaging   Restaging PET Scan 1. Markedly improved skeletal activity, with only several faint foci of residual accentuated metabolic activity, but resolution of the vast majority of the previously extensive osseous metastatic disease. 2. Resolution of the prior right internal mammary and right prevascular lymph nodes. 3. Residual subcutaneous edema and edema along fascia planes in the left upper thigh. This remains somewhat more than I would expect for placement of an IM nail 6 weeks ago. If there is leg swelling further down, consider Doppler venous ultrasound to rule out left lower extremity DVT. I do not perceive an obvious difference in density between the pelvic and common femoral veins on the noncontrast CT data. 4. Chronic right maxillary and right sphenoid sinusitis.   01/16/2016 - 10/20/2017 Anti-estrogen oral therapy   Letrozole 2.5 mg daily. She was switched to exemestane due to disease progression on 02/08/17. Stopped on 10/20/2017 when treatment changed to chemo     03/01/2016 Miscellaneous   Patient presented to ED following a fall; she reports she landed on her back and is now experiencing back pain. The patient was evaluated and discharge home same day with pain control.   05/01/2016 Imaging   CT CAP IMPRESSION: 1. Stable exam.  No new or progressive disease identified. 2. No evidence for residual or recurrent adenopathy within the chest. 3. Stable bone metastasis.   05/01/2016 Imaging   BONE SCAN IMPRESSION: 1. Small focal uptake involving the anterior rib ends of the left second and right fourth ribs, new since the prior bone scan. The location of this uptake is more suggestive of a traumatic or inflammatory etiology as opposed to metastatic disease. No other evidence of metastatic disease. 2. There are other areas of stable uptake which are most likely degenerative/reactive, including right shoulder and cervical spine uptake and uptake along the right femur adjacent to the ORIF hardware.   05/20/2016 Imaging   NM PET Skull to Thigh IMPRESSION: 1. There multiple metastatic foci in the bony pelvis, including some new abnormal foci compare to the prior PET-CT, compatible with mildly progressive bony metastatic disease. Also, a left T11 vertebral hypermetabolic metastatic lesion is observed, new compared to the prior exam. 2. No active extraosseous malignancy is currently identified. 3. Right anterior fourth rib healing fracture, appears be   06/04/2016 - 01/08/2017 Chemotherapy   Second line chemotherapy Kadcyla every 3 weeks started on 06/04/16 and stopped on 01/08/17 due to mixed response.     10/05/2016 Imaging   CT CAP and Bone Scan:  Bones: left intramedullary rod and left femoral head neck screw in place. In the proximal diaphysis of the left femur there is cortical thickening and   irregularity. No lytic or blastic bone lesions. No fracture or vertebral endplate destruction.   No definite evidence of recurrent disease in the  chest, abdomen, or pelvis.   10/28/2016 Imaging   CT A/P IMPRESSION: No acute process demonstrated in the abdomen or pelvis. No evidence of bowel obstruction or inflammation.   11/04/2016 Imaging   DG foot complete left: IMPRESSION: No fracture or dislocation.  No soft tissue abnormality   11/23/2016 Imaging   CT RIGHT FEMUR IMPRESSION: Negative CT scan of the right thigh.  No visible metastatic disease.  CT LEFT FEMUR IMPRESSION: 1. Postsurgical changes related to prior cephalomedullary rod fixation of the left femur with linear lucency along the anterolateral proximal femoral cortex at level of the lesser trochanter, suspicious for nondisplaced fracture. 2. Slightly permeative appearance of the proximal left femoral cortex at the site of known prior osseous metastases is largely unchanged. 3. Unchanged patchy lucency and sclerosis along the anterior acetabulum, which appears to correspond with an area of faint uptake on the prior PET-CT, suspicious for osseous metastasis. These results will be called to the ordering clinician or representative by the Radiologist Assistant, and communication documented in the PACS or zVision Dashboard.     01/27/2017 Imaging   IMPRESSION: 1. Mixed response to osseous metastasis. Some hypermetabolic lesions are new and increasingly hypermetabolic, while another index lesion is decreasingly hypermetabolic. 2. No evidence of hypermetabolic soft tissue metastasis.    02/08/2017 - 10/19/2017 Antibody Plan   -Herceptin every 3 week since 02/08/17 -Oral Lanpantinib 1562m and decreased to 5045mdue to diarrhea and depression. Due to disease progression she swithced to Verzenio 15035mn 05/04/17. She did not toelrate well so she was switched to Ibrance 100m50m 05/31/17. Due to her neutropenia, Ibrance dose reduced to 75 mg daily, 3 weeks on, one-week off, stopped on 10/20/2017 due to disease progression.    04/30/2017 PET scan   IMPRESSION: 1.  Primarily increase in hypermetabolism of osseous metastasis. An isolated right acetabular lesion is decreased in hypermetabolism since the prior exam. 2. No evidence of hypermetabolic soft tissue metastasis. 3. Similar non FDG avid right-sided thyroid nodule, favoring a benign etiology. Recommend attention on follow-up.   08/30/2017 PET scan   08/30/2017 PET Scan  IMPRESSION: 1. Worsening osseous metastatic disease. 2. Focal hypermetabolism in the central spinal canal the L1 level, similar to the prior exam. Metastatic implant cannot be excluded. 3. Slight enlargement of a right thyroid nodule which now appears more cystic in character. Consider further evaluation with thyroid ultrasound. If patient is clinically hyperthyroid, consider nuclear medicine thyroid uptake and scan   08/30/2017 Progression   08/30/2017 PET Scan  IMPRESSION: 1. Worsening osseous metastatic disease. 2. Focal hypermetabolism in the central spinal canal the L1 level, similar to the prior exam. Metastatic implant cannot be excluded. 3. Slight enlargement of a right thyroid nodule which now appears more cystic in character. Consider further evaluation with thyroid ultrasound. If patient is clinically hyperthyroid, consider nuclear medicine thyroid uptake and scan   09/29/2017 - 10/11/2017 Radiation Therapy   Pt received 27 Gy in 9 fractions directed at the areas of significant uptake noted on recent bone scan the right pelvis and right proximal femur, with Dr, KinaSondra Come10/09/2017 - 04/07/2018 Chemotherapy   -Herceptin every 3 weeks starting 02/08/17, perjeta added on 10/20/2017  -weekly Taxol started on 10/21/2017. Will reduce Taxol to 2 weeks on, 1 week off starting on 02/04/17.  Stoped due to mild disease progression  10/27/2017 Imaging   10/27/2017 CT AP IMPRESSION: 1. No acute abnormality. No evidence of bowel obstruction or acute bowel inflammation. Normal appendix. 2. Stable patchy sclerotic osseous  metastases throughout the visualized skeleton. No new or progressive metastatic disease in the abdomen or pelvis.   01/11/2018 Imaging   Whole Body Bone Scan 01/11/18  IMPRESSION: 1. Multifocal osseous metastases as described. 2. Right scapular metastasis. 3. Right superior thoracic spinous process. 4. Right sacral ala and right L5 metastases. 5. Right acetabular metastasis. 6. Right proximal femur metastasis. 7. Diffuse metastases throughout the left femur. 8. Anterior right fourth rib metastasis.   01/11/2018 Imaging   CT CAP W Contrast 01/11/18  IMPRESSION: 1. Similar-appearing osseous metastatic disease. 2. No evidence for progressed metastatic disease in the chest, abdomen or pelvis.   03/30/2018 Imaging   Bone Scan 03/30/18  IMPRESSION: Stable multifocal bony metastatic disease.   03/30/2018 Imaging    CT CAP W Contrast 03/30/18 IMPRESSION: 1. New mild contiguous wall thickening with associated mucosal hyperenhancement throughout the left colon and rectum, suggesting nonspecific infectious or inflammatory proctocolitis. Differential includes C diff colitis. 2. Stable faint patchy sclerosis in the iliac bones and lumbar vertebral bodies. No new or progressive osseous metastatic disease by CT. Please note that the osseous lesions are relatively occult by CT and would be better evaluated by PET-CT, as clinically warranted. 3. No evidence of extra osseous metastatic disease.  These results will be called to the ordering clinician or representative by the Radiologist Assistant, and communication documented in the PACS or zVision Dashboard.   04/15/2018 - 06/16/2018 Chemotherapy   Enhertu q3weeks starting 04/15/18. Stopped Enhertu after cycle 3 on 05/27/18 due to poor toleration due to poor toleration with anorexia, weight loss, N&V, diarrhea   04/21/2018 PET scan   PET 04/21/18  IMPRESSION: 1. Overall marked interval improvement in bony hypermetabolic disease. All of the  previously identified hypermetabolic osseous metastases have decreased in hypermetabolism in the interval. There is a new tiny focus of hypermetabolism in the right aspect of the L3 vertebral body that appears to correspond to a new 8 mm lucency, raising concern for new metastatic disease. Close attention on follow-up recommended. 2. Focal hypermetabolism in the central spinal canal at the level of L1 previously has resolved. 3. No hypermetabolic soft tissue disease on today's study. 4. Right thyroid nodule seen previously has decreased in the interval.    06/16/2018 Imaging   CT LUMBAR SPINE WO CONTRAST 06/16/18 IMPRESSION: 1. Transitional lumbosacral anatomy. Correlation with radiographs is recommended prior to any operative intervention. 2. Suspicion of a right L3 vertebral body metastasis on the April PET-CT is not evident by CT. Stable degenerative versus metastatic lesion at the right L5 superior endplate. Small but increased right iliac bone metastasis since March. Lumbar MRI would best characterize lumbar metastatic disease (without contrast if necessary). 3. Suspect L2-L3 left subarticular segment disc herniation. Query left L2 radiculitis. 4. Stable appearance of rightward L4-L5 disc degeneration which could affect the right foramen and right lateral recess.   06/17/2018 - 11/16/2018 Chemotherapy   Restart Taxol 2 weeks on/1 week off with Herceptin and Perjeta q3weeks on 06/17/18. Perjeta was stopped after C5 due to diarrhea. Stopped Taxol after 11/16/18 due to disease progression to L3.    07/06/2018 PET scan   PET  IMPRESSION: 1. Near total resolution of hypermetabolic activity associated with skeletal metastasis. Very mild activity associated with the LEFT iliac bone which is similar to background blood pool activity. 2. No new  or progressive disease.   11/21/2018 PET scan   IMPRESSION: 1. New focal hypermetabolic lesion eccentric to the right in the L3 vertebral body,  maximum SUV 6.1 compatible with a new focus of active malignancy. 2. Very low-grade activity similar to blood pool in previous existing mildly sclerotic metastatic lesions including the left iliac bone lesion. Some of the mildly sclerotic lesions are not hypermetabolic compatible with effectively treated bony metastatic disease. 3. No hypermetabolic soft tissue lesions are identified.     11/30/2018 - 08/25/2019 Chemotherapy   Continue Herceptin q3weeks. Stopped 08/25/19 due to significant disease progression and switched to  IV Matuzumab   12/12/2018 - 12/17/2018 Chemotherapy   Tucatinib (TUKYSA) 6 tabs BID as a next line therapy starting 12/12/18. Stopped 12/17/18 duet o N&V, poor appetite and HA    12/21/2018 - 03/17/2019 Chemotherapy   IV Eribulin on day 1, 8 every 21 days starting 12/21/18. Stopped 03/17/19 due to mixed response to treatment.    03/21/2019 PET scan   IMPRESSION: 1. Mixed response to therapy of osseous metastasis. Although a right-sided L4 lesion demonstrates decreased hypermetabolism today, new left iliac and right ischial hypermetabolic lesions are seen. 2. No evidence of hypermetabolic soft tissue metastasis.     03/27/2019 - 07/21/2019 Chemotherapy   Neratinib (Nerlynx) titrate up from 155m to 2491mstarting 03/27/19. She could not tolerate 16036mnd stopped on 02/05/19 due to N&V and diarrhea. Will restart at 120m73m 04/10/18-04/27/19 and 4/19-4/22. Reduced to 80mg49me daily on 05/10/19. Held/stopped since 07/21/19 due to dry cough, Nausea and body aches.   06/23/2019 Imaging   CT Head  IMPRESSION: 1. No acute intracranial abnormality or metastatic disease identified. 2. Chronic white matter disease in the right hemisphere is stable from prior exams.     09/05/2019 PET scan   IMPRESSION: 1. Significant progression of widespread hypermetabolic osseous metastases throughout the axial skeleton as detailed. 2. No hypermetabolic extraosseous sites of metastatic disease.    09/15/2019 - 12/22/2019 Chemotherapy   -IV Margetuximab q3weeks and IV Gemcitabine 2 weeks on/1 week off starting 09/15/19. Carboplatin added to Gemcitabine starting with C3 on 11/03/19. D/c after C5 due to disease progression in bone.    12/13/2019 Imaging   PET  IMPRESSION: 1. Interval progression of widespread, FDG avid osseous metastasis involving the axial skeleton. 2. No FDG avid extra osseous sites of metastatic disease.     01/19/2020 -  Chemotherapy   Xeloda BID 2 weeks on/1 week off and Herpcetin q3weeks starting 01/19/20  with C1 at 1000mg 44mand will increase to 1500mg B67mf tolerable.       CURRENT THERAPY:  -Zometa q3months37montha BID 2 weeks on/1 week off and Herpcetin q3weeks starting1/7/22. C1 at 1000mg BID55m will increase to 1500mg BID 65molerable.C2 increased to 1000mg in th27m and 1500mg in the39m   INTERVAL HISTORY:  [name] a follow up. She presents to the clinic with her interpretor. She started Xeloda on 01/19/20. She notes her first week went well. She denies nausea or diarrhea. She has been able to eat more and gain weight. She notes does occasionally get dizzy. She overall feels better on this medication.    REVIEW OF SYSTEMS:   Constitutional: Denies fevers, chills or abnormal weight loss (+) Occasional dizziness  Eyes: Denies blurriness of vision Ears, nose, mouth, throat, and face: Denies mucositis or sore throat Respiratory: Denies cough, dyspnea or wheezes Cardiovascular: Denies palpitation, chest discomfort or lower extremity  swelling Gastrointestinal:  Denies nausea, heartburn or change in bowel habits Skin: Denies abnormal skin rashes Lymphatics: Denies new lymphadenopathy or easy bruising Neurological:Denies numbness, tingling or new weaknesses Behavioral/Psych: Mood is stable, no new changes  All other systems were reviewed with the patient and are negative.  MEDICAL HISTORY:  Past Medical History:  Diagnosis Date   . Abnormal Pap smear   . Anxiety   . Breast cancer ([place])   . Cancer ([place]) 1999   breast-s/p mastectomy, chemo, rad  . CIN I (cervical intraepithelial neoplasia I) 2003   by colpo  . Congenital deafness   . Deaf   . Depression   . Port-A-Cath in place 2017   for chemotherapy  . Radiation 07/12/15-07/26/15   left femur 30 Gy  . S/P bilateral oophorectomy     SURGICAL HISTORY: Past Surgical History:  Procedure Laterality Date  . ABDOMINAL HYSTERECTOMY    . INTRAMEDULLARY (IM) NAIL INTERTROCHANTERIC Left 06/26/2015   Procedure: LEFT BIOMET LONG AFFIXS NAIL;  Surgeon: [name], MD;  Location: [facility];  Service: Orthopedics;  Laterality: Left;  . IR GENERIC HISTORICAL  08/06/2015   IR US GUIDE VASC ACCESS LEFT 08/06/2015 Aletta Edouard, MD [facility]  . IR GENERIC HISTORICAL  08/06/2015   IR FLUORO GUIDE CV LINE LEFT 08/06/2015 Aletta Edouard, MD [facility]  . IR GENERIC HISTORICAL  03/05/2016   IR CV LINE INJECTION 03/05/2016 Markus Daft, MD [facility]  . LEFT HEART CATH AND CORONARY ANGIOGRAPHY N/A 12/13/2017   Procedure: LEFT HEART CATH AND CORONARY ANGIOGRAPHY;  Surgeon: Leonie Man, MD;  Location: [facility];  Service: Cardiovascular;  Laterality: N/A;  . MASTECTOMY     right breast  . MASTECTOMY    . OVARIAN CYST REMOVAL    . RADIOLOGY WITH ANESTHESIA Left 06/25/2015   Procedure: MRI OF LEFT HIP WITH OR WITHOUT CONTRAST;  Surgeon: Medication Radiologist, MD;  Location: [facility];  Service: Radiology;  Laterality: Left;  DR. MCINTYRE/MRI  . TUBAL LIGATION      I have reviewed the social history and family history with the patient and they are unchanged from previous note.  ALLERGIES:  is allergic to promethazine hcl, chlorhexidine, and diphenhydramine hcl.  MEDICATIONS:  Current Outpatient Medications  Medication Sig Dispense Refill  . ALPRAZolam (XANAX) 0.25 MG tablet Take 1 tablet (0.25 mg total) by mouth 2 (two) times daily as needed for anxiety. 30 tablet 0   . capecitabine (XELODA) 500 MG tablet Take 3 tabs in morning, and 2 tabs in evening. Take for 14 days on, 7 days off. Repeat every 21 days. 70 tablet 0  . diphenoxylate-atropine (LOMOTIL) 2.5-0.025 MG tablet Take 1-2 tablets by mouth 4 (four) times daily as needed for diarrhea or loose stools. 60 tablet 1  . lidocaine-prilocaine (EMLA) cream Apply 1 application topically as needed. Apply to Porta-Cath 1-2 hours prior to access as directed. 90 g 2  . ondansetron (ZOFRAN) 8 MG tablet Take 1 tablet (8 mg total) by mouth every 8 (eight) hours as needed for nausea or vomiting. 45 tablet 1  . oxyCODONE (ROXICODONE) 5 MG immediate release tablet Take 1 tablet (5 mg total) by mouth every 8 (eight) hours as needed for severe pain. 60 tablet 0  . potassium chloride (KLOR-CON) 10 MEQ tablet Take 3 tablets (30 mEq total) by mouth daily. 90 tablet 2  . prochlorperazine (COMPAZINE) 10 MG tablet Take 1 tablet (10 mg total) by mouth 2 (two) times daily as  needed for nausea or vomiting. 20 tablet 1  . zolpidem (AMBIEN) 10 MG tablet Take 1 tablet (10 mg total) by mouth at bedtime as needed for sleep. 15 tablet 2   No current facility-administered medications for this visit.   Facility-Administered Medications Ordered in Other Visits  Medication Dose Route Frequency Provider Last Rate Last Admin  . potassium chloride SA (K-DUR) CR tablet 40 mEq  40 mEq Oral Once Truitt Merle, MD      . sodium chloride 0.9 % 1,000 mL with potassium chloride 10 mEq infusion   Intravenous Continuous Gordy Levan, MD   Stopped at 09/10/15 1653  . sodium chloride 0.9 % injection 10 mL  10 mL Intravenous PRN Livesay, Lennis P, MD        PHYSICAL EXAMINATION: ECOG PERFORMANCE STATUS: 1 - Symptomatic but completely ambulatory  Vitals:   01/26/20 0951  BP: (!) 127/59  Pulse: 70  Resp: 15  Temp: 98 F (36.7 C)  SpO2: 100%   Filed Weights   01/26/20 0951  Weight: 135 lb 11.2 oz (61.6 kg)    Due to COVID19 we will limit  examination to appearance. Patient had no complaints.  GENERAL:alert, no distress and comfortable SKIN: skin color normal, no rashes or significant lesions EYES: normal, Conjunctiva are pink and non-injected, sclera clear  NEURO: alert & oriented x 3 with fluent speech   LABORATORY DATA:  I have reviewed the data as listed CBC Latest Ref Rng & Units 01/26/2020 01/19/2020 12/29/2019  WBC 4.0 - 10.5 K/uL 3.2(L) 3.8(L) 15.9(H)  Hemoglobin 12.0 - 15.0 g/dL 9.2(L) 9.7(L) 7.2(L)  Hematocrit 36.0 - 46.0 % 27.0(L) 30.0(L) 21.9(L)  Platelets 150 - 400 K/uL 203 192 27(L)     CMP Latest Ref Rng & Units 01/26/2020 01/19/2020 12/29/2019  Glucose 70 - 99 mg/dL 93 89 97  BUN 6 - 20 mg/dL _0 Creatinine 0.44 - 1.00 mg/dL 0.87 0.87 0.81  Sodium 135 - 145 mmol/L 140 140 139  Potassium 3.5 - 5.1 mmol/L 3.2(L) 3.6 3.5  Chloride 98 - 111 mmol/L 109 106 106  CO2 22 - 32 mmol/L 23 24 20(L)  Calcium 8.9 - 10.3 mg/dL 8.9 9.6 8.7(L)  Total Protein 6.5 - 8.1 g/dL 6.4(L) 7.0 6.3(L)  Total Bilirubin 0.3 - 1.2 mg/dL 1.3(H) 1.1 0.9  Alkaline Phos 38 - 126 U/L 74 84 121  AST 15 - 41 U/L _1 ALT 0 - 44 U/L _2 RADIOGRAPHIC STUDIES: I have personally reviewed the radiological images as listed and agreed with the findings in the report. No results found.   ASSESSMENT & PLAN:  LAVONIA EAGER is a 55 y.o. 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 She had right mastectomy, 4 cycles of AC-T and 5 years of Tamoxifen/AI.  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 -After her first line ofchemotreatment and initial maintenance therapy, she progressed or had poor toleration of multiple lines of treatment since then. -Based on 12/13/19 PET, sherecently progressed diffusely in the bone on7th linetherapywith anti-HER2IV  Margetuximabq3weekswithIV Chemo weeklyGemcitabine(Carboplatin added C3)2 weeks on/1 week offafter 5 cycles,09/15/19-12/22/19. -Ipreviouslydiscussed the longer she is on treatment the less treatment options and efficacy she will have. I discussed she has triedmost availableanti-HER2antibodiesand has some chemotherapy options remaining such as Docetaxel or oral Xeloda with  Herceptin.  -She opted to proceed with Xeloda. I started her on  Xeloda 1019m BID 2 weeks on/1 week off for first cycle on 01/19/20. If tolerating well will increase. Continue Herceptin q3weeks.  -She tolerated first week well with no side effects. She has been able to increase eating, weight and overall feels. Better. She has minimal skin darkening of palms. I discussed watching for increased skin toxicity and fatigue as she continues this treatment.  -Labs reviewed, WBC 3.2, Hg 9.2. Overall adequate to continue cycle 1 Xeloda at same dose.  -I discussed increasing C2 Xeloda to 10031min the AM and 150029mn the PM. She is agreeable. Plan to start with next F/u in 2 weeks.   2.Body pain, secondary to bone mets, Hypocalcemia, leg cramps  -She is being treated with Zometa q3mo4monthxt injection in 01/2020 -She will continue oral calcium and Vit Dand Prilosec for Acid Reflux. -Current pain includes, lower back, upper legs, hips and rightshoulder.PET12/3/21does indicate disease progression in her spine, pelvis and right scapula. -Pain iswellcontrolled on 5mg 66mcodone1-2tabdaily.  3.Congenital deafness  4.Mild anemia, secondary to chemotherapy and malignancy -Moderate andStable   5.Hypokalemia, Hypomagnesium, secondary toDiarrhea -She developed diarrhea s/p cycle 3 Perjetaand again with Nerlynx, Both have been D/c. -She is onOral potassium 10meq23mShe stopped oral Mag because it made her nauseous.  -She can take more potassium when she has diarrhea.  6.Goal of care discussion,  DNR/DNI -Wepreviouslyreviewed goal of care andI recommend DNR/DNI, shepreviouslyagreed.  -I encouraged her to start setting up her living will.We previously discussed hospice care if she has no more treatment available or could not tolerate more treatment  7.Insomnia -She cannot take Benadryl due to allergy reaction. She has tried trazodone with no relief.  -She is on Xanax and Ambien10mgat75mf tablets nightly. She knows not to take both at the same time. If she needs Oxycodone at night, she can use half tablet and an Ambien which helps her sleep.  -Stable.   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 Stable. Has not been mentioned lately.   9. Mildhyperbilirubinemia  -likely second to chemotherapy, fluctuates  -Will monitor closely. Stable.   PLAN: -Labs reviewed, continue C1 Xeloda 1000mg BI31mr 2 weeks on/1 week off -Lab, flush, F/u and Herceptin in 2 weeks. Will start C2 Xeloda then     No problem-specific Assessment & Plan notes found for this encounter.   No orders of the defined types were placed in this encounter.  All questions were answered. The patient knows to call the clinic with any problems, questions or concerns. No barriers to learning was detected. The total time spent in the appointment was 25 minutes.      FengTruitt Merle4/2022   I, Amoya BeJoslyn Devoning as scribe for  FengTruitt MerleI have reviewed the above documentation for accuracy and completeness, and I agree with the above.

## 2020-01-26 ENCOUNTER — Telehealth: Payer: Self-pay

## 2020-01-26 ENCOUNTER — Other Ambulatory Visit: Payer: Self-pay | Admitting: Hematology

## 2020-01-26 ENCOUNTER — Inpatient Hospital Stay (HOSPITAL_BASED_OUTPATIENT_CLINIC_OR_DEPARTMENT_OTHER): Payer: Medicaid Other | Admitting: Hematology

## 2020-01-26 ENCOUNTER — Inpatient Hospital Stay: Payer: Medicaid Other

## 2020-01-26 ENCOUNTER — Other Ambulatory Visit: Payer: Self-pay

## 2020-01-26 VITALS — BP 127/59 | HR 70 | Temp 98.0°F | Resp 15 | Ht 65.0 in | Wt 135.7 lb

## 2020-01-26 DIAGNOSIS — C7951 Secondary malignant neoplasm of bone: Secondary | ICD-10-CM | POA: Diagnosis not present

## 2020-01-26 DIAGNOSIS — C50911 Malignant neoplasm of unspecified site of right female breast: Secondary | ICD-10-CM | POA: Diagnosis not present

## 2020-01-26 DIAGNOSIS — Z95828 Presence of other vascular implants and grafts: Secondary | ICD-10-CM

## 2020-01-26 DIAGNOSIS — G4700 Insomnia, unspecified: Secondary | ICD-10-CM | POA: Diagnosis not present

## 2020-01-26 DIAGNOSIS — Z5112 Encounter for antineoplastic immunotherapy: Secondary | ICD-10-CM | POA: Diagnosis not present

## 2020-01-26 LAB — CMP (CANCER CENTER ONLY)
ALT: 12 U/L (ref 0–44)
AST: 15 U/L (ref 15–41)
Albumin: 3.4 g/dL — ABNORMAL LOW (ref 3.5–5.0)
Alkaline Phosphatase: 74 U/L (ref 38–126)
Anion gap: 8 (ref 5–15)
BUN: 9 mg/dL (ref 6–20)
CO2: 23 mmol/L (ref 22–32)
Calcium: 8.9 mg/dL (ref 8.9–10.3)
Chloride: 109 mmol/L (ref 98–111)
Creatinine: 0.87 mg/dL (ref 0.44–1.00)
GFR, Estimated: >60 mL/min (ref >60)
Glucose, Bld: 93 mg/dL (ref 70–99)
Potassium: 3.2 mmol/L — ABNORMAL LOW (ref 3.5–5.1)
Sodium: 140 mmol/L (ref 135–145)
Total Bilirubin: 1.3 mg/dL — ABNORMAL HIGH (ref 0.3–1.2)
Total Protein: 6.4 g/dL — ABNORMAL LOW (ref 6.5–8.1)

## 2020-01-26 LAB — CBC WITH DIFFERENTIAL (CANCER CENTER ONLY)
Abs Immature Granulocytes: 0.01 10*3/uL (ref 0.00–0.07)
Basophils Absolute: 0 10*3/uL (ref 0.0–0.1)
Basophils Relative: 0 %
Eosinophils Absolute: 0.1 10*3/uL (ref 0.0–0.5)
Eosinophils Relative: 2 %
HCT: 27 % — ABNORMAL LOW (ref 36.0–46.0)
Hemoglobin: 9.2 g/dL — ABNORMAL LOW (ref 12.0–15.0)
Immature Granulocytes: 0 %
Lymphocytes Relative: 32 %
Lymphs Abs: 1 10*3/uL (ref 0.7–4.0)
MCH: 32.4 pg (ref 26.0–34.0)
MCHC: 34.1 g/dL (ref 30.0–36.0)
MCV: 95.1 fL (ref 80.0–100.0)
Monocytes Absolute: 0.3 10*3/uL (ref 0.1–1.0)
Monocytes Relative: 10 %
Neutro Abs: 1.8 10*3/uL (ref 1.7–7.7)
Neutrophils Relative %: 56 %
Platelet Count: 203 10*3/uL (ref 150–400)
RBC: 2.84 MIL/uL — ABNORMAL LOW (ref 3.87–5.11)
RDW: 13.6 % (ref 11.5–15.5)
WBC Count: 3.2 10*3/uL — ABNORMAL LOW (ref 4.0–10.5)
nRBC: 0 % (ref 0.0–0.2)

## 2020-01-26 MED ORDER — CAPECITABINE 500 MG PO TABS: ORAL_TABLET | ORAL | 0 refills | Status: DC

## 2020-01-26 MED ORDER — HEPARIN SOD (PORK) LOCK FLUSH 100 UNIT/ML IV SOLN
500.0000 [IU] | Freq: Once | INTRAVENOUS | Status: AC
  Administered 2020-01-26: 500 [IU] via INTRAVENOUS
  Filled 2020-01-26: qty 5

## 2020-01-26 MED ORDER — SODIUM CHLORIDE 0.9% FLUSH
10.0000 mL | INTRAVENOUS | Status: DC | PRN
  Administered 2020-01-26: 10 mL via INTRAVENOUS
  Filled 2020-01-26: qty 10

## 2020-01-26 NOTE — Telephone Encounter (Signed)
Error

## 2020-01-27 ENCOUNTER — Encounter: Payer: Self-pay | Admitting: Hematology

## 2020-01-29 ENCOUNTER — Telehealth: Payer: Self-pay | Admitting: Hematology

## 2020-01-29 NOTE — Telephone Encounter (Signed)
No 1/14 los. No changes made to pt's schedule.  

## 2020-02-05 ENCOUNTER — Telehealth: Payer: Self-pay

## 2020-02-05 NOTE — Telephone Encounter (Signed)
[name] left vm asking for refill of Xeloda. Forwarded to Dr Burr Medico

## 2020-02-07 NOTE — Progress Notes (Incomplete)
[place]   Telephone:([phone]) [phone] [phone]   Clinic Follow up Note   Patient Care Team: [name], [name], [name] as PCP - General (Physician Assistant) [name], MD as Consulting Physician (Orthopedic Surgery)  Date of Service:  02/07/2020  CHIEF COMPLAINT: f/u of metastatic breast cancer  SUMMARY OF ONCOLOGIC HISTORY: Oncology History Overview Note   Cancer Staging No matching staging information was found for the patient.     [name], right ([place])  11/1997 Cancer Diagnosis   Patient had 1.4 cm poorly differentiated right breast carcinoma diagnosed in Nov 1999 at age 39, 1/7 axillary nodes involved, ER/PR and HER 2 positive. She had mastectomy with the 7 axillary node evaluation, 4 cycles of adriamycin/ cytoxan followed by taxotere, then five years of tamoxifen thru June 2005 (no herceptin in 1999). She had bilateral oophorectomy in May 2005. She was briefly on aromatase inhibitor after tamoxifen, but discontinued this herself due to poor tolerance.   04/04/2015 Genetic Testing   Negative genetic testing on the breast/ovarian cancer pnael.  The Breast/Ovarian gene panel offered by [facility] includes sequencing and rearrangement analysis for the following 20 genes:  ATM, BARD1, BRCA1, BRCA2, BRIP1, CDH1, CHEK2, EPCAM, FANCC, MLH1, MSH2, MSH6, NBN, PALB2, PMS2, PTEN, RAD51C, RAD51D, TP53, and XRCC2.   06/26/2015 Pathology Results   Initial Path Report Bone, curettage, Left femur met, intramedullary subtroch tissue - METASTATIC ADENOCARCINOMA.  Estrogen Receptor: 95%, POSITIVE, STRONG STAINING INTENSITY Progesterone Receptor: 2%, POSITIVE, STRONG  HER2 (+), IHC 3+   06/26/2015 Surgery   Biopsy of metastatic tissue and stabilization with Affixus trochanteric nail, proximal and distal interlock of left femur by Dr. [name]    06/28/2015 Imaging   CT Chest w/ Contrast 1. Extensive right internal mammary chain  lymphadenopathy consistent with metastatic breast cancer. 2. Lytic metastases involving the posterior elements at T1 with probable nondisplaced pathologic fracture of the spinous process. 3. No other evidence of thoracic metastatic disease. 4. Trace bilateral pleural effusions with associated bibasilar atelectasis. 5. Indeterminate right thyroid nodule, not previously imaged. This could be evaluated with thyroid ultrasound as clinically warranted.   06/29/2015 -  Chemotherapy   Zometa started monthly on 06/29/15 and switched to q75months from 01/08/17   07/01/2015 Imaging   Bone Scan 1. Increased activity noted throughout the left femur. Although metastatic disease cannot be excluded, these changes are most likely secondary to prior surgery. 2. No other focal abnormalities identified to suggest metastatic disease.   07/12/2015 - 07/26/2015 Radiation Therapy   Left femur, 30 Gy in 10 fractions by Dr. Sondra Come    07/14/2015 Initial Diagnosis   [name], right ([place])   07/19/2015 Imaging   Initial PET Scan 1. Severe multifocal osseous metastatic disease, much greater than anticipated based on prior imaging. 2. Multifocal nodal metastases involving the right internal mammary, prevascular and subpectoral lymph nodes. No axillary pulmonary involvement identified. 3. No distant extra osseous metastases.   07/30/2015 - 11/29/2015 Chemotherapy   Docetaxel, Herceptin and Perjeta every 3 weeks X6 cycles, pt tolerated moderately well, restaging scan showed excellent response    09/18/2015 Imaging   CT Head w/wo Contrast 1. No acute intracranial abnormality or significant interval change. 2. Stable minimal periventricular white matter hypoattenuation on the right. This may reflect a remote ischemic injury. 3. No evidence for metastatic disease to the brain. 4. No focal soft tissue lesion to explain the patient's tenderness.   10/30/2015 Imaging  CT Abdomen Pelvis w/  Contrast 1. Few mildly prominent fluid-filled loops of small bowel scattered throughout the abdomen, with intraluminal fluid density within the distal colon. Given the provided history, findings are suggestive of acute enteritis/diarrheal illness. 2. No other acute intra-abdominal or pelvic process identified. 3. Widespread osseous metastatic disease, better evaluated on most recent PET-CT from 07/19/2015. No pathologic fracture or other complication. 4. 11 mm hypodensity within the left kidney. This lesion measures intermediate density, and is indeterminate. While this lesion is similar in size relative to recent studies, this is increased in size relative to prior study from 2012. Further evaluation with dedicated renal mass protocol CT and/or MRI is recommended for complete characterization.   12/2015 - 05/14/2016 Chemotherapy   Maintenance Herceptin and pejeta every 3 weeks stopped on 05/14/16 due to disease progression   12/25/2015 Imaging   Restaging PET Scan 1. Markedly improved skeletal activity, with only several faint foci of residual accentuated metabolic activity, but resolution of the vast majority of the previously extensive osseous metastatic disease. 2. Resolution of the prior right internal mammary and right prevascular lymph nodes. 3. Residual subcutaneous edema and edema along fascia planes in the left upper thigh. This remains somewhat more than I would expect for placement of an IM nail 6 weeks ago. If there is leg swelling further down, consider Doppler venous ultrasound to rule out left lower extremity DVT. I do not perceive an obvious difference in density between the pelvic and common femoral veins on the noncontrast CT data. 4. Chronic right maxillary and right sphenoid sinusitis.   01/16/2016 - 10/20/2017 Anti-estrogen oral therapy   Letrozole 2.5 mg daily. She was switched to exemestane due to disease progression on 02/08/17. Stopped on 10/20/2017 when treatment changed to chemo     03/01/2016 Miscellaneous   Patient presented to ED following a fall; she reports she landed on her back and is now experiencing back pain. The patient was evaluated and discharge home same day with pain control.   05/01/2016 Imaging   CT CAP IMPRESSION: 1. Stable exam.  No new or progressive disease identified. 2. No evidence for residual or recurrent adenopathy within the chest. 3. Stable bone metastasis.   05/01/2016 Imaging   BONE SCAN IMPRESSION: 1. Small focal uptake involving the anterior rib ends of the left second and right fourth ribs, new since the prior bone scan. The location of this uptake is more suggestive of a traumatic or inflammatory etiology as opposed to metastatic disease. No other evidence of metastatic disease. 2. There are other areas of stable uptake which are most likely degenerative/reactive, including right shoulder and cervical spine uptake and uptake along the right femur adjacent to the ORIF hardware.   05/20/2016 Imaging   NM PET Skull to Thigh IMPRESSION: 1. There multiple metastatic foci in the bony pelvis, including some new abnormal foci compare to the prior PET-CT, compatible with mildly progressive bony metastatic disease. Also, a left T11 vertebral hypermetabolic metastatic lesion is observed, new compared to the prior exam. 2. No active extraosseous malignancy is currently identified. 3. Right anterior fourth rib healing fracture, appears be   06/04/2016 - 01/08/2017 Chemotherapy   Second line chemotherapy Kadcyla every 3 weeks started on 06/04/16 and stopped on 01/08/17 due to mixed response.     10/05/2016 Imaging   CT CAP and Bone Scan:  Bones: left intramedullary rod and left femoral head neck screw in place. In the proximal diaphysis of the left femur there is cortical thickening and   irregularity. No lytic or blastic bone lesions. No fracture or vertebral endplate destruction.   No definite evidence of recurrent disease in the  chest, abdomen, or pelvis.   10/28/2016 Imaging   CT A/P IMPRESSION: No acute process demonstrated in the abdomen or pelvis. No evidence of bowel obstruction or inflammation.   11/04/2016 Imaging   DG foot complete left: IMPRESSION: No fracture or dislocation.  No soft tissue abnormality   11/23/2016 Imaging   CT RIGHT FEMUR IMPRESSION: Negative CT scan of the right thigh.  No visible metastatic disease.  CT LEFT FEMUR IMPRESSION: 1. Postsurgical changes related to prior cephalomedullary rod fixation of the left femur with linear lucency along the anterolateral proximal femoral cortex at level of the lesser trochanter, suspicious for nondisplaced fracture. 2. Slightly permeative appearance of the proximal left femoral cortex at the site of known prior osseous metastases is largely unchanged. 3. Unchanged patchy lucency and sclerosis along the anterior acetabulum, which appears to correspond with an area of faint uptake on the prior PET-CT, suspicious for osseous metastasis. These results will be called to the ordering clinician or representative by the Radiologist Assistant, and communication documented in the PACS or zVision Dashboard.     01/27/2017 Imaging   IMPRESSION: 1. Mixed response to osseous metastasis. Some hypermetabolic lesions are new and increasingly hypermetabolic, while another index lesion is decreasingly hypermetabolic. 2. No evidence of hypermetabolic soft tissue metastasis.    02/08/2017 - 10/19/2017 Antibody Plan   -Herceptin every 3 week since 02/08/17 -Oral Lanpantinib 1562m and decreased to 5045mdue to diarrhea and depression. Due to disease progression she swithced to Verzenio 15035mn 05/04/17. She did not toelrate well so she was switched to Ibrance 100m50m 05/31/17. Due to her neutropenia, Ibrance dose reduced to 75 mg daily, 3 weeks on, one-week off, stopped on 10/20/2017 due to disease progression.    04/30/2017 PET scan   IMPRESSION: 1.  Primarily increase in hypermetabolism of osseous metastasis. An isolated right acetabular lesion is decreased in hypermetabolism since the prior exam. 2. No evidence of hypermetabolic soft tissue metastasis. 3. Similar non FDG avid right-sided thyroid nodule, favoring a benign etiology. Recommend attention on follow-up.   08/30/2017 PET scan   08/30/2017 PET Scan  IMPRESSION: 1. Worsening osseous metastatic disease. 2. Focal hypermetabolism in the central spinal canal the L1 level, similar to the prior exam. Metastatic implant cannot be excluded. 3. Slight enlargement of a right thyroid nodule which now appears more cystic in character. Consider further evaluation with thyroid ultrasound. If patient is clinically hyperthyroid, consider nuclear medicine thyroid uptake and scan   08/30/2017 Progression   08/30/2017 PET Scan  IMPRESSION: 1. Worsening osseous metastatic disease. 2. Focal hypermetabolism in the central spinal canal the L1 level, similar to the prior exam. Metastatic implant cannot be excluded. 3. Slight enlargement of a right thyroid nodule which now appears more cystic in character. Consider further evaluation with thyroid ultrasound. If patient is clinically hyperthyroid, consider nuclear medicine thyroid uptake and scan   09/29/2017 - 10/11/2017 Radiation Therapy   Pt received 27 Gy in 9 fractions directed at the areas of significant uptake noted on recent bone scan the right pelvis and right proximal femur, with Dr, KinaSondra Come10/09/2017 - 04/07/2018 Chemotherapy   -Herceptin every 3 weeks starting 02/08/17, perjeta added on 10/20/2017  -weekly Taxol started on 10/21/2017. Will reduce Taxol to 2 weeks on, 1 week off starting on 02/04/17.  Stoped due to mild disease progression  10/27/2017 Imaging   10/27/2017 CT AP IMPRESSION: 1. No acute abnormality. No evidence of bowel obstruction or acute bowel inflammation. Normal appendix. 2. Stable patchy sclerotic osseous  metastases throughout the visualized skeleton. No new or progressive metastatic disease in the abdomen or pelvis.   01/11/2018 Imaging   Whole Body Bone Scan 01/11/18  IMPRESSION: 1. Multifocal osseous metastases as described. 2. Right scapular metastasis. 3. Right superior thoracic spinous process. 4. Right sacral ala and right L5 metastases. 5. Right acetabular metastasis. 6. Right proximal femur metastasis. 7. Diffuse metastases throughout the left femur. 8. Anterior right fourth rib metastasis.   01/11/2018 Imaging   CT CAP W Contrast 01/11/18  IMPRESSION: 1. Similar-appearing osseous metastatic disease. 2. No evidence for progressed metastatic disease in the chest, abdomen or pelvis.   03/30/2018 Imaging   Bone Scan 03/30/18  IMPRESSION: Stable multifocal bony metastatic disease.   03/30/2018 Imaging    CT CAP W Contrast 03/30/18 IMPRESSION: 1. New mild contiguous wall thickening with associated mucosal hyperenhancement throughout the left colon and rectum, suggesting nonspecific infectious or inflammatory proctocolitis. Differential includes C diff colitis. 2. Stable faint patchy sclerosis in the iliac bones and lumbar vertebral bodies. No new or progressive osseous metastatic disease by CT. Please note that the osseous lesions are relatively occult by CT and would be better evaluated by PET-CT, as clinically warranted. 3. No evidence of extra osseous metastatic disease.  These results will be called to the ordering clinician or representative by the Radiologist Assistant, and communication documented in the PACS or zVision Dashboard.   04/15/2018 - 06/16/2018 Chemotherapy   Enhertu q3weeks starting 04/15/18. Stopped Enhertu after cycle 3 on 05/27/18 due to poor toleration due to poor toleration with anorexia, weight loss, N&V, diarrhea   04/21/2018 PET scan   PET 04/21/18  IMPRESSION: 1. Overall marked interval improvement in bony hypermetabolic disease. All of the  previously identified hypermetabolic osseous metastases have decreased in hypermetabolism in the interval. There is a new tiny focus of hypermetabolism in the right aspect of the L3 vertebral body that appears to correspond to a new 8 mm lucency, raising concern for new metastatic disease. Close attention on follow-up recommended. 2. Focal hypermetabolism in the central spinal canal at the level of L1 previously has resolved. 3. No hypermetabolic soft tissue disease on today's study. 4. Right thyroid nodule seen previously has decreased in the interval.    06/16/2018 Imaging   CT LUMBAR SPINE WO CONTRAST 06/16/18 IMPRESSION: 1. Transitional lumbosacral anatomy. Correlation with radiographs is recommended prior to any operative intervention. 2. Suspicion of a right L3 vertebral body metastasis on the April PET-CT is not evident by CT. Stable degenerative versus metastatic lesion at the right L5 superior endplate. Small but increased right iliac bone metastasis since March. Lumbar MRI would best characterize lumbar metastatic disease (without contrast if necessary). 3. Suspect L2-L3 left subarticular segment disc herniation. Query left L2 radiculitis. 4. Stable appearance of rightward L4-L5 disc degeneration which could affect the right foramen and right lateral recess.   06/17/2018 - 11/16/2018 Chemotherapy   Restart Taxol 2 weeks on/1 week off with Herceptin and Perjeta q3weeks on 06/17/18. Perjeta was stopped after C5 due to diarrhea. Stopped Taxol after 11/16/18 due to disease progression to L3.    07/06/2018 PET scan   PET  IMPRESSION: 1. Near total resolution of hypermetabolic activity associated with skeletal metastasis. Very mild activity associated with the LEFT iliac bone which is similar to background blood pool activity. 2. No new   or progressive disease.   11/21/2018 PET scan   IMPRESSION: 1. New focal hypermetabolic lesion eccentric to the right in the L3 vertebral body,  maximum SUV 6.1 compatible with a new focus of active malignancy. 2. Very low-grade activity similar to blood pool in previous existing mildly sclerotic metastatic lesions including the left iliac bone lesion. Some of the mildly sclerotic lesions are not hypermetabolic compatible with effectively treated bony metastatic disease. 3. No hypermetabolic soft tissue lesions are identified.     11/30/2018 - 08/25/2019 Chemotherapy   Continue Herceptin q3weeks. Stopped 08/25/19 due to significant disease progression and switched to  IV Matuzumab   12/12/2018 - 12/17/2018 Chemotherapy   Tucatinib (TUKYSA) 6 tabs BID as a next line therapy starting 12/12/18. Stopped 12/17/18 duet o N&V, poor appetite and HA    12/21/2018 - 03/17/2019 Chemotherapy   IV Eribulin on day 1, 8 every 21 days starting 12/21/18. Stopped 03/17/19 due to mixed response to treatment.    03/21/2019 PET scan   IMPRESSION: 1. Mixed response to therapy of osseous metastasis. Although a right-sided L4 lesion demonstrates decreased hypermetabolism today, new left iliac and right ischial hypermetabolic lesions are seen. 2. No evidence of hypermetabolic soft tissue metastasis.     03/27/2019 - 07/21/2019 Chemotherapy   Neratinib (Nerlynx) titrate up from 120mg  to 240mg  starting 03/27/19. She could not tolerate 160mg  and stopped on 02/05/19 due to N&V and diarrhea. Will restart at 120mg  on 04/10/18-04/27/19 and 4/19-4/22. Reduced to 80mg  once daily on 05/10/19. Held/stopped since 07/21/19 due to dry cough, Nausea and body aches.   06/23/2019 Imaging   CT Head  IMPRESSION: 1. No acute intracranial abnormality or metastatic disease identified. 2. Chronic white matter disease in the right hemisphere is stable from prior exams.     09/05/2019 PET scan   IMPRESSION: 1. Significant progression of widespread hypermetabolic osseous metastases throughout the axial skeleton as detailed. 2. No hypermetabolic extraosseous sites of metastatic disease.    09/15/2019 - 12/22/2019 Chemotherapy   -IV Margetuximab q3weeks and IV Gemcitabine 2 weeks on/1 week off starting 09/15/19. Carboplatin added to Gemcitabine starting with C3 on 11/03/19. D/c after C5 due to disease progression in bone.    12/13/2019 Imaging   PET  IMPRESSION: 1. Interval progression of widespread, FDG avid osseous metastasis involving the axial skeleton. 2. No FDG avid extra osseous sites of metastatic disease.     01/19/2020 -  Chemotherapy   Xeloda BID 2 weeks on/1 week off and Herpcetin q3weeks starting 01/19/20  with C1 at 1000mg  BID and will increase to 1500mg  BID if tolerable.       CURRENT THERAPY:  -Zometa q31months -Xeloda BID 2 weeks on/1 week off and Herpcetin q3weeks starting1/7/22.C1 at 1000mg  BID and will increase to 1500mg  BID if tolerable.C2 increased to 1000mg  in the AM and 1500mg  in the PM.   INTERVAL HISTORY: *** [name] is here for a follow up. She presents to the clinic with her interpretor.     REVIEW OF SYSTEMS:  *** Constitutional: Denies fevers, chills or abnormal weight loss Eyes: Denies blurriness of vision Ears, nose, mouth, throat, and face: Denies mucositis or sore throat Respiratory: Denies cough, dyspnea or wheezes Cardiovascular: Denies palpitation, chest discomfort or lower extremity swelling Gastrointestinal:  Denies nausea, heartburn or change in bowel habits Skin: Denies abnormal skin rashes Lymphatics: Denies new lymphadenopathy or easy bruising Neurological:Denies numbness, tingling or new weaknesses Behavioral/Psych: Mood is stable, no new changes  All other systems were reviewed with the  patient and are negative.  MEDICAL HISTORY:  Past Medical History:  Diagnosis Date  . Abnormal Pap smear   . Anxiety   . Breast cancer ([place])   . Cancer ([place]) 1999   breast-s/p mastectomy, chemo, rad  . CIN I (cervical intraepithelial neoplasia I) 2003   by colpo  . Congenital deafness   . Deaf   . Depression   .  Port-A-Cath in place 2017   for chemotherapy  . Radiation 07/12/15-07/26/15   left femur 30 Gy  . S/P bilateral oophorectomy     SURGICAL HISTORY: Past Surgical History:  Procedure Laterality Date  . ABDOMINAL HYSTERECTOMY    . INTRAMEDULLARY (IM) NAIL INTERTROCHANTERIC Left 06/26/2015   Procedure: LEFT BIOMET LONG AFFIXS NAIL;  Surgeon: [name], MD;  Location: [facility];  Service: Orthopedics;  Laterality: Left;  . IR GENERIC HISTORICAL  08/06/2015   IR US GUIDE VASC ACCESS LEFT 08/06/2015 Aletta Edouard, MD [facility]  . IR GENERIC HISTORICAL  08/06/2015   IR FLUORO GUIDE CV LINE LEFT 08/06/2015 Aletta Edouard, MD [facility]  . IR GENERIC HISTORICAL  03/05/2016   IR CV LINE INJECTION 03/05/2016 Markus Daft, MD [facility]  . LEFT HEART CATH AND CORONARY ANGIOGRAPHY N/A 12/13/2017   Procedure: LEFT HEART CATH AND CORONARY ANGIOGRAPHY;  Surgeon: Leonie Man, MD;  Location: [facility];  Service: Cardiovascular;  Laterality: N/A;  . MASTECTOMY     right breast  . MASTECTOMY    . OVARIAN CYST REMOVAL    . RADIOLOGY WITH ANESTHESIA Left 06/25/2015   Procedure: MRI OF LEFT HIP WITH OR WITHOUT CONTRAST;  Surgeon: Medication Radiologist, MD;  Location: [facility];  Service: Radiology;  Laterality: Left;  DR. MCINTYRE/MRI  . TUBAL LIGATION      I have reviewed the social history and family history with the patient and they are unchanged from previous note.  ALLERGIES:  is allergic to promethazine hcl, chlorhexidine, and diphenhydramine hcl.  MEDICATIONS:  Current Outpatient Medications  Medication Sig Dispense Refill  . ALPRAZolam (XANAX) 0.25 MG tablet Take 1 tablet (0.25 mg total) by mouth 2 (two) times daily as needed for anxiety. 30 tablet 0  . capecitabine (XELODA) 500 MG tablet Take 3 tabs in morning, and 2 tabs in evening. Take for 14 days on, 7 days off. Repeat every 21 days. 70 tablet 0  . diphenoxylate-atropine (LOMOTIL) 2.5-0.025 MG tablet Take 1-2 tablets by mouth 4  (four) times daily as needed for diarrhea or loose stools. 60 tablet 1  . lidocaine-prilocaine (EMLA) cream Apply 1 application topically as needed. Apply to Porta-Cath 1-2 hours prior to access as directed. 90 g 2  . ondansetron (ZOFRAN) 8 MG tablet Take 1 tablet (8 mg total) by mouth every 8 (eight) hours as needed for nausea or vomiting. 45 tablet 1  . oxyCODONE (ROXICODONE) 5 MG immediate release tablet Take 1 tablet (5 mg total) by mouth every 8 (eight) hours as needed for severe pain. 60 tablet 0  . potassium chloride (KLOR-CON) 10 MEQ tablet Take 3 tablets (30 mEq total) by mouth daily. 90 tablet 2  . prochlorperazine (COMPAZINE) 10 MG tablet Take 1 tablet (10 mg total) by mouth 2 (two) times daily as needed for nausea or vomiting. 20 tablet 1  . zolpidem (AMBIEN) 10 MG tablet Take 1 tablet (10 mg total) by mouth at bedtime as needed for sleep. 15 tablet 2   No current facility-administered medications for this visit.   Facility-Administered  Medications Ordered in Other Visits  Medication Dose Route Frequency Provider Last Rate Last Admin  . potassium chloride SA (K-DUR) CR tablet 40 mEq  40 mEq Oral Once Truitt Merle, MD      . sodium chloride 0.9 % 1,000 mL with potassium chloride 10 mEq infusion   Intravenous Continuous Gordy Levan, MD   Stopped at 09/10/15 1653  . sodium chloride 0.9 % injection 10 mL  10 mL Intravenous PRN Livesay, Lennis P, MD        PHYSICAL EXAMINATION: ECOG PERFORMANCE STATUS: {CHL ONC ECOG PS:[phone]}  There were no vitals filed for this visit. There were no vitals filed for this visit. *** GENERAL:alert, no distress and comfortable SKIN: skin color, texture, turgor are normal, no rashes or significant lesions EYES: normal, Conjunctiva are pink and non-injected, sclera clear {OROPHARYNX:no exudate, no erythema and lips, buccal mucosa, and tongue normal}  NECK: supple, thyroid normal size, non-tender, without nodularity LYMPH:  no palpable  lymphadenopathy in the cervical, axillary {or inguinal} LUNGS: clear to auscultation and percussion with normal breathing effort HEART: regular rate & rhythm and no murmurs and no lower extremity edema ABDOMEN:abdomen soft, non-tender and normal bowel sounds Musculoskeletal:no cyanosis of digits and no clubbing  NEURO: alert & oriented x 3 with fluent speech, no focal motor/sensory deficits  LABORATORY DATA:  I have reviewed the data as listed CBC Latest Ref Rng & Units 01/26/2020 01/19/2020 12/29/2019  WBC 4.0 - 10.5 K/uL 3.2(L) 3.8(L) 15.9(H)  Hemoglobin 12.0 - 15.0 g/dL 9.2(L) 9.7(L) 7.2(L)  Hematocrit 36.0 - 46.0 % 27.0(L) 30.0(L) 21.9(L)  Platelets 150 - 400 K/uL 203 192 27(L)     CMP Latest Ref Rng & Units 01/26/2020 01/19/2020 12/29/2019  Glucose 70 - 99 mg/dL 93 89 97  BUN 6 - 20 mg/dL $Remove'9 12 14  'PstBlHn$ Creatinine 0.44 - 1.00 mg/dL 0.87 0.87 0.81  Sodium 135 - 145 mmol/L 140 140 139  Potassium 3.5 - 5.1 mmol/L 3.2(L) 3.6 3.5  Chloride 98 - 111 mmol/L 109 106 106  CO2 22 - 32 mmol/L 23 24 20(L)  Calcium 8.9 - 10.3 mg/dL 8.9 9.6 8.7(L)  Total Protein 6.5 - 8.1 g/dL 6.4(L) 7.0 6.3(L)  Total Bilirubin 0.3 - 1.2 mg/dL 1.3(H) 1.1 0.9  Alkaline Phos 38 - 126 U/L 74 84 121  AST 15 - 41 U/L $Remo'15 17 16  'YleJc$ ALT 0 - 44 U/L $Remo'12 12 29      'NxIDk$ RADIOGRAPHIC STUDIES: I have personally reviewed the radiological images as listed and agreed with the findings in the report. No results found.   ASSESSMENT & PLAN:  SHILO PHILIPSON is a 55 y.o. 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 She had right mastectomy, 4 cycles of AC-T and 5 years of Tamoxifen/AI.  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 -After her first line ofchemotreatment and initial maintenance therapy, she progressed or had poor toleration of multiple lines of treatment since then. -Based on  12/13/19 PET, sherecently progressed diffusely in the bone on7th linetherapywith anti-HER2IV Margetuximabq3weekswithIV Chemo weeklyGemcitabine(Carboplatin added C3)2 weeks on/1 week offafter 5 cycles,09/15/19-12/22/19. -Ipreviouslydiscussed the longer she is on treatment the less treatment options and efficacy she will have. I discussed she has triedmost availableanti-HER2antibodiesand has some chemotherapy options remaining such as Docetaxel or oral Xeloda with Herceptin.  -She opted to proceed with Xeloda. I started her on  Xeloda 1000mg  BID 2 weeks on/1 week off for first cycle on 01/19/20. If tolerating well will increase. Continue Herceptin q3weeks.  -She tolerated first week well with no side effects. She has been able to increase eating, weight and overall feels. Better. She has minimal skin darkening of palms. I discussed watching for increased skin toxicity and fatigue as she continues this treatment.  -Labs reviewed, WBC 3.2, Hg 9.2. Overall adequate to continue cycle 1 Xeloda at same dose.  -I discussed increasing C2 Xeloda to 1000mg  in the AM and 1500mg  in the PM. She is agreeable. Plan to start with next F/u in 2 weeks.   2.Body pain, secondary to bone mets, Hypocalcemia, leg cramps  -She is being treated with Zometa q71months.Next injection in 01/2020 -She will continue oral calcium and Vit Dand Prilosec for Acid Reflux. -Current pain includes, lower back, upper legs, hips and rightshoulder.PET12/3/21does indicate disease progression in her spine, pelvis and right scapula. -Pain iswellcontrolled on 5mg  Oxycodone1-2tabdaily.  3.Congenital deafness  4.Mild anemia, secondary to chemotherapy and malignancy -Moderate andStable   5.Hypokalemia, Hypomagnesium, secondary toDiarrhea -She developed diarrhea s/p cycle 3 Perjetaand again with Nerlynx, Both have been D/c. -She is onOral potassium 75meqBID.She stopped oral Mag because it made her  nauseous.  -She can take more potassium when she has diarrhea.  6.Goal of care discussion, DNR/DNI -Wepreviouslyreviewed goal of care andI recommend DNR/DNI, shepreviouslyagreed.  -I encouraged her to start setting up her living will.We previously discussed hospice care if she has no more treatment available or could not tolerate more treatment  7.Insomnia -She cannot take Benadryl due to allergy reaction. She has tried trazodone with no relief.  -She is on Xanax and Ambien10mg at half tabletsnightly. She knows not to take both at the same time. If she needs Oxycodone at night, she can use half tablet and an Ambien which helps her sleep. -Stable.   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 Stable. Has not been mentioned lately.   9. Mildhyperbilirubinemia  -likely second to chemotherapy, fluctuates  -Will monitor closely. Stable.   PLAN: -Labs reviewed, continue C1 Xeloda 1000mg  BID for 2 weeks on/1 week off -Lab, flush, F/u and Herceptin in 2 weeks. Will start C2 Xeloda then    No problem-specific Assessment & Plan notes found for this encounter.   No orders of the defined types were placed in this encounter.  All questions were answered. The patient knows to call the clinic with any problems, questions or concerns. No barriers to learning was detected. The total time spent in the appointment was {CHL ONC TIME VISIT - RWERX:[id number]}.     Joslyn Devon 02/07/2020   Oneal Deputy, am acting as scribe for Truitt Merle, MD.   {Add scribe attestation statement}

## 2020-02-09 ENCOUNTER — Inpatient Hospital Stay: Payer: Medicaid Other | Admitting: Hematology

## 2020-02-09 ENCOUNTER — Telehealth: Payer: Self-pay

## 2020-02-09 ENCOUNTER — Inpatient Hospital Stay: Payer: Medicaid Other

## 2020-02-09 ENCOUNTER — Telehealth: Payer: Self-pay | Admitting: Hematology

## 2020-02-09 MED FILL — CAPECITABINE 500 MG TABLET: 500 | 21 days supply | Qty: 70 | Fill #0

## 2020-02-09 NOTE — Progress Notes (Signed)
[facility]   [phone] [phone]   Clinic Follow up Note   Patient Care Team: [name], [name], [name] as PCP - General (Physician Assistant) [name], MD as Consulting Physician (Orthopedic Surgery)  Date of Service:  02/12/2020  CHIEF COMPLAINT:  f/u of metastatic breast cancer  SUMMARY OF ONCOLOGIC HISTORY: Oncology History Overview Note   Cancer Staging No matching staging information was found for the patient.     [name], right ([place])  11/1997 Cancer Diagnosis   Patient had 1.4 cm poorly differentiated right breast carcinoma diagnosed in Nov 1999 at age 72, 1/7 axillary nodes involved, ER/PR and HER 2 positive. She had mastectomy with the 7 axillary node evaluation, 4 cycles of adriamycin/ cytoxan followed by taxotere, then five years of tamoxifen thru June 2005 (no herceptin in 1999). She had bilateral oophorectomy in May 2005. She was briefly on aromatase inhibitor after tamoxifen, but discontinued this herself due to poor tolerance.   04/04/2015 Genetic Testing   Negative genetic testing on the breast/ovarian cancer pnael.  The Breast/Ovarian gene panel offered by [facility] includes sequencing and rearrangement analysis for the following 20 genes:  ATM, BARD1, BRCA1, BRCA2, BRIP1, CDH1, CHEK2, EPCAM, FANCC, MLH1, MSH2, MSH6, NBN, PALB2, PMS2, PTEN, RAD51C, RAD51D, TP53, and XRCC2.   06/26/2015 Pathology Results   Initial Path Report Bone, curettage, Left femur met, intramedullary subtroch tissue - METASTATIC ADENOCARCINOMA.  Estrogen Receptor: 95%, POSITIVE, STRONG STAINING INTENSITY Progesterone Receptor: 2%, POSITIVE, STRONG  HER2 (+), IHC 3+   06/26/2015 Surgery   Biopsy of metastatic tissue and stabilization with Affixus trochanteric nail, proximal and distal interlock of left femur by Dr. [name]    06/28/2015 Imaging   CT Chest w/ Contrast 1. Extensive right internal mammary chain  lymphadenopathy consistent with metastatic breast cancer. 2. Lytic metastases involving the posterior elements at T1 with probable nondisplaced pathologic fracture of the spinous process. 3. No other evidence of thoracic metastatic disease. 4. Trace bilateral pleural effusions with associated bibasilar atelectasis. 5. Indeterminate right thyroid nodule, not previously imaged. This could be evaluated with thyroid ultrasound as clinically warranted.   06/29/2015 -  Chemotherapy   Zometa started monthly on 06/29/15 and switched to q32month from 01/08/17   07/01/2015 Imaging   Bone Scan 1. Increased activity noted throughout the left femur. Although metastatic disease cannot be excluded, these changes are most likely secondary to prior surgery. 2. No other focal abnormalities identified to suggest metastatic disease.   07/12/2015 - 07/26/2015 Radiation Therapy   Left femur, 30 Gy in 10 fractions by Dr. [name]   07/14/2015 Initial Diagnosis   [name], right ([place]   07/19/2015 Imaging   Initial PET Scan 1. Severe multifocal osseous metastatic disease, much greater than anticipated based on prior imaging. 2. Multifocal nodal metastases involving the right internal mammary, prevascular and subpectoral lymph nodes. No axillary pulmonary involvement identified. 3. No distant extra osseous metastases.   07/30/2015 - 11/29/2015 Chemotherapy   Docetaxel, Herceptin and Perjeta every 3 weeks X6 cycles, pt tolerated moderately well, restaging scan showed excellent response    09/18/2015 Imaging   CT Head w/wo Contrast 1. No acute intracranial abnormality or significant interval change. 2. Stable minimal periventricular white matter hypoattenuation on the right. This may reflect a remote ischemic injury. 3. No evidence for metastatic disease to the brain. 4. No focal soft tissue lesion to explain the patient's tenderness.   10/30/2015 Imaging  CT Abdomen Pelvis w/  Contrast 1. Few mildly prominent fluid-filled loops of small bowel scattered throughout the abdomen, with intraluminal fluid density within the distal colon. Given the provided history, findings are suggestive of acute enteritis/diarrheal illness. 2. No other acute intra-abdominal or pelvic process identified. 3. Widespread osseous metastatic disease, better evaluated on most recent PET-CT from 07/19/2015. No pathologic fracture or other complication. 4. 11 mm hypodensity within the left kidney. This lesion measures intermediate density, and is indeterminate. While this lesion is similar in size relative to recent studies, this is increased in size relative to prior study from 2012. Further evaluation with dedicated renal mass protocol CT and/or MRI is recommended for complete characterization.   12/2015 - 05/14/2016 Chemotherapy   Maintenance Herceptin and pejeta every 3 weeks stopped on 05/14/16 due to disease progression   12/25/2015 Imaging   Restaging PET Scan 1. Markedly improved skeletal activity, with only several faint foci of residual accentuated metabolic activity, but resolution of the vast majority of the previously extensive osseous metastatic disease. 2. Resolution of the prior right internal mammary and right prevascular lymph nodes. 3. Residual subcutaneous edema and edema along fascia planes in the left upper thigh. This remains somewhat more than I would expect for placement of an IM nail 6 weeks ago. If there is leg swelling further down, consider Doppler venous ultrasound to rule out left lower extremity DVT. I do not perceive an obvious difference in density between the pelvic and common femoral veins on the noncontrast CT data. 4. Chronic right maxillary and right sphenoid sinusitis.   01/16/2016 - 10/20/2017 Anti-estrogen oral therapy   Letrozole 2.5 mg daily. She was switched to exemestane due to disease progression on 02/08/17. Stopped on 10/20/2017 when treatment changed to chemo     03/01/2016 Miscellaneous   Patient presented to ED following a fall; she reports she landed on her back and is now experiencing back pain. The patient was evaluated and discharge home same day with pain control.   05/01/2016 Imaging   CT CAP IMPRESSION: 1. Stable exam.  No new or progressive disease identified. 2. No evidence for residual or recurrent adenopathy within the chest. 3. Stable bone metastasis.   05/01/2016 Imaging   BONE SCAN IMPRESSION: 1. Small focal uptake involving the anterior rib ends of the left second and right fourth ribs, new since the prior bone scan. The location of this uptake is more suggestive of a traumatic or inflammatory etiology as opposed to metastatic disease. No other evidence of metastatic disease. 2. There are other areas of stable uptake which are most likely degenerative/reactive, including right shoulder and cervical spine uptake and uptake along the right femur adjacent to the ORIF hardware.   05/20/2016 Imaging   NM PET Skull to Thigh IMPRESSION: 1. There multiple metastatic foci in the bony pelvis, including some new abnormal foci compare to the prior PET-CT, compatible with mildly progressive bony metastatic disease. Also, a left T11 vertebral hypermetabolic metastatic lesion is observed, new compared to the prior exam. 2. No active extraosseous malignancy is currently identified. 3. Right anterior fourth rib healing fracture, appears be   06/04/2016 - 01/08/2017 Chemotherapy   Second line chemotherapy Kadcyla every 3 weeks started on 06/04/16 and stopped on 01/08/17 due to mixed response.     10/05/2016 Imaging   CT CAP and Bone Scan:  Bones: left intramedullary rod and left femoral head neck screw in place. In the proximal diaphysis of the left femur there is cortical thickening and   irregularity. No lytic or blastic bone lesions. No fracture or vertebral endplate destruction.   No definite evidence of recurrent disease in the  chest, abdomen, or pelvis.   10/28/2016 Imaging   CT A/P IMPRESSION: No acute process demonstrated in the abdomen or pelvis. No evidence of bowel obstruction or inflammation.   11/04/2016 Imaging   DG foot complete left: IMPRESSION: No fracture or dislocation.  No soft tissue abnormality   11/23/2016 Imaging   CT RIGHT FEMUR IMPRESSION: Negative CT scan of the right thigh.  No visible metastatic disease.  CT LEFT FEMUR IMPRESSION: 1. Postsurgical changes related to prior cephalomedullary rod fixation of the left femur with linear lucency along the anterolateral proximal femoral cortex at level of the lesser trochanter, suspicious for nondisplaced fracture. 2. Slightly permeative appearance of the proximal left femoral cortex at the site of known prior osseous metastases is largely unchanged. 3. Unchanged patchy lucency and sclerosis along the anterior acetabulum, which appears to correspond with an area of faint uptake on the prior PET-CT, suspicious for osseous metastasis. These results will be called to the ordering clinician or representative by the Radiologist Assistant, and communication documented in the PACS or zVision Dashboard.     01/27/2017 Imaging   IMPRESSION: 1. Mixed response to osseous metastasis. Some hypermetabolic lesions are new and increasingly hypermetabolic, while another index lesion is decreasingly hypermetabolic. 2. No evidence of hypermetabolic soft tissue metastasis.    02/08/2017 - 10/19/2017 Antibody Plan   -Herceptin every 3 week since 02/08/17 -Oral Lanpantinib 1562m and decreased to 5045mdue to diarrhea and depression. Due to disease progression she swithced to Verzenio 15035mn 05/04/17. She did not toelrate well so she was switched to Ibrance 100m50m 05/31/17. Due to her neutropenia, Ibrance dose reduced to 75 mg daily, 3 weeks on, one-week off, stopped on 10/20/2017 due to disease progression.    04/30/2017 PET scan   IMPRESSION: 1.  Primarily increase in hypermetabolism of osseous metastasis. An isolated right acetabular lesion is decreased in hypermetabolism since the prior exam. 2. No evidence of hypermetabolic soft tissue metastasis. 3. Similar non FDG avid right-sided thyroid nodule, favoring a benign etiology. Recommend attention on follow-up.   08/30/2017 PET scan   08/30/2017 PET Scan  IMPRESSION: 1. Worsening osseous metastatic disease. 2. Focal hypermetabolism in the central spinal canal the L1 level, similar to the prior exam. Metastatic implant cannot be excluded. 3. Slight enlargement of a right thyroid nodule which now appears more cystic in character. Consider further evaluation with thyroid ultrasound. If patient is clinically hyperthyroid, consider nuclear medicine thyroid uptake and scan   08/30/2017 Progression   08/30/2017 PET Scan  IMPRESSION: 1. Worsening osseous metastatic disease. 2. Focal hypermetabolism in the central spinal canal the L1 level, similar to the prior exam. Metastatic implant cannot be excluded. 3. Slight enlargement of a right thyroid nodule which now appears more cystic in character. Consider further evaluation with thyroid ultrasound. If patient is clinically hyperthyroid, consider nuclear medicine thyroid uptake and scan   09/29/2017 - 10/11/2017 Radiation Therapy   Pt received 27 Gy in 9 fractions directed at the areas of significant uptake noted on recent bone scan the right pelvis and right proximal femur, with Dr, KinaSondra Come10/09/2017 - 04/07/2018 Chemotherapy   -Herceptin every 3 weeks starting 02/08/17, perjeta added on 10/20/2017  -weekly Taxol started on 10/21/2017. Will reduce Taxol to 2 weeks on, 1 week off starting on 02/04/17.  Stoped due to mild disease progression  10/27/2017 Imaging   10/27/2017 CT AP IMPRESSION: 1. No acute abnormality. No evidence of bowel obstruction or acute bowel inflammation. Normal appendix. 2. Stable patchy sclerotic osseous  metastases throughout the visualized skeleton. No new or progressive metastatic disease in the abdomen or pelvis.   01/11/2018 Imaging   Whole Body Bone Scan 01/11/18  IMPRESSION: 1. Multifocal osseous metastases as described. 2. Right scapular metastasis. 3. Right superior thoracic spinous process. 4. Right sacral ala and right L5 metastases. 5. Right acetabular metastasis. 6. Right proximal femur metastasis. 7. Diffuse metastases throughout the left femur. 8. Anterior right fourth rib metastasis.   01/11/2018 Imaging   CT CAP W Contrast 01/11/18  IMPRESSION: 1. Similar-appearing osseous metastatic disease. 2. No evidence for progressed metastatic disease in the chest, abdomen or pelvis.   03/30/2018 Imaging   Bone Scan 03/30/18  IMPRESSION: Stable multifocal bony metastatic disease.   03/30/2018 Imaging    CT CAP W Contrast 03/30/18 IMPRESSION: 1. New mild contiguous wall thickening with associated mucosal hyperenhancement throughout the left colon and rectum, suggesting nonspecific infectious or inflammatory proctocolitis. Differential includes C diff colitis. 2. Stable faint patchy sclerosis in the iliac bones and lumbar vertebral bodies. No new or progressive osseous metastatic disease by CT. Please note that the osseous lesions are relatively occult by CT and would be better evaluated by PET-CT, as clinically warranted. 3. No evidence of extra osseous metastatic disease.  These results will be called to the ordering clinician or representative by the Radiologist Assistant, and communication documented in the PACS or zVision Dashboard.   04/15/2018 - 06/16/2018 Chemotherapy   Enhertu q3weeks starting 04/15/18. Stopped Enhertu after cycle 3 on 05/27/18 due to poor toleration due to poor toleration with anorexia, weight loss, N&V, diarrhea   04/21/2018 PET scan   PET 04/21/18  IMPRESSION: 1. Overall marked interval improvement in bony hypermetabolic disease. All of the  previously identified hypermetabolic osseous metastases have decreased in hypermetabolism in the interval. There is a new tiny focus of hypermetabolism in the right aspect of the L3 vertebral body that appears to correspond to a new 8 mm lucency, raising concern for new metastatic disease. Close attention on follow-up recommended. 2. Focal hypermetabolism in the central spinal canal at the level of L1 previously has resolved. 3. No hypermetabolic soft tissue disease on today's study. 4. Right thyroid nodule seen previously has decreased in the interval.    06/16/2018 Imaging   CT LUMBAR SPINE WO CONTRAST 06/16/18 IMPRESSION: 1. Transitional lumbosacral anatomy. Correlation with radiographs is recommended prior to any operative intervention. 2. Suspicion of a right L3 vertebral body metastasis on the April PET-CT is not evident by CT. Stable degenerative versus metastatic lesion at the right L5 superior endplate. Small but increased right iliac bone metastasis since March. Lumbar MRI would best characterize lumbar metastatic disease (without contrast if necessary). 3. Suspect L2-L3 left subarticular segment disc herniation. Query left L2 radiculitis. 4. Stable appearance of rightward L4-L5 disc degeneration which could affect the right foramen and right lateral recess.   06/17/2018 - 11/16/2018 Chemotherapy   Restart Taxol 2 weeks on/1 week off with Herceptin and Perjeta q3weeks on 06/17/18. Perjeta was stopped after C5 due to diarrhea. Stopped Taxol after 11/16/18 due to disease progression to L3.    07/06/2018 PET scan   PET  IMPRESSION: 1. Near total resolution of hypermetabolic activity associated with skeletal metastasis. Very mild activity associated with the LEFT iliac bone which is similar to background blood pool activity. 2. No new   or progressive disease.   11/21/2018 PET scan   IMPRESSION: 1. New focal hypermetabolic lesion eccentric to the right in the L3 vertebral body,  maximum SUV 6.1 compatible with a new focus of active malignancy. 2. Very low-grade activity similar to blood pool in previous existing mildly sclerotic metastatic lesions including the left iliac bone lesion. Some of the mildly sclerotic lesions are not hypermetabolic compatible with effectively treated bony metastatic disease. 3. No hypermetabolic soft tissue lesions are identified.     11/30/2018 - 08/25/2019 Chemotherapy   Continue Herceptin q3weeks. Stopped 08/25/19 due to significant disease progression and switched to  IV Matuzumab   12/12/2018 - 12/17/2018 Chemotherapy   Tucatinib (TUKYSA) 6 tabs BID as a next line therapy starting 12/12/18. Stopped 12/17/18 duet o N&V, poor appetite and HA    12/21/2018 - 03/17/2019 Chemotherapy   IV Eribulin on day 1, 8 every 21 days starting 12/21/18. Stopped 03/17/19 due to mixed response to treatment.    03/21/2019 PET scan   IMPRESSION: 1. Mixed response to therapy of osseous metastasis. Although a right-sided L4 lesion demonstrates decreased hypermetabolism today, new left iliac and right ischial hypermetabolic lesions are seen. 2. No evidence of hypermetabolic soft tissue metastasis.     03/27/2019 - 07/21/2019 Chemotherapy   Neratinib (Nerlynx) titrate up from 163m to 2472mstarting 03/27/19. She could not tolerate 16077mnd stopped on 02/05/19 due to N&V and diarrhea. Will restart at 120m26m 04/10/18-04/27/19 and 4/19-4/22. Reduced to 80mg74me daily on 05/10/19. Held/stopped since 07/21/19 due to dry cough, Nausea and body aches.   06/23/2019 Imaging   CT Head  IMPRESSION: 1. No acute intracranial abnormality or metastatic disease identified. 2. Chronic white matter disease in the right hemisphere is stable from prior exams.     09/05/2019 PET scan   IMPRESSION: 1. Significant progression of widespread hypermetabolic osseous metastases throughout the axial skeleton as detailed. 2. No hypermetabolic extraosseous sites of metastatic disease.    09/15/2019 - 12/22/2019 Chemotherapy   -IV Margetuximab q3weeks and IV Gemcitabine 2 weeks on/1 week off starting 09/15/19. Carboplatin added to Gemcitabine starting with C3 on 11/03/19. D/c after C5 due to disease progression in bone.    12/13/2019 Imaging   PET  IMPRESSION: 1. Interval progression of widespread, FDG avid osseous metastasis involving the axial skeleton. 2. No FDG avid extra osseous sites of metastatic disease.     01/19/2020 -  Chemotherapy   Xeloda BID 2 weeks on/1 week off and Herpcetin q3weeks starting 01/19/20  with C1 at 1000mg 45mand will increase to 1500mg B43mf tolerable.       CURRENT THERAPY:  -Zometa q3months56montha BID 2 weeks on/1 week off and Herceptin q3weeks starting1/7/22.C1 at 1000mg BID45m will increase to 1500mg BID 28molerable.C2 increased to 1500mg in th4m and 1000mg in the32mstarting 02/12/20  INTERVAL HISTORY:  [name] a follow up. She presents to the clinic with her interpretor. She notes she was not able to come to her visit because she had to pick up her grandchildren at 2pm to take care of them. She tried to reschedule but was not completed. She notes she has received her latest Xeloda prescription 2 days ago. She notes before 3 days ago she was not reached by pharmacy about her prescription. She notes pain in her wrists and tingling in her feet, not her hands. The tingling started 2 days ago. She notes right upper leg pain, which feels like  her left thigh use to feel. She makes her walk slower and will sleep on her left side. She denies injury to her right leg. She takes her Oxycodone 5 tabs yesterday which she felt did not control her pain. She notes her husband ordered NFV vitamin which can help with bone cancer. She wants to know what it can do.     REVIEW OF SYSTEMS:   Constitutional: Denies fevers, chills or abnormal weight loss Eyes: Denies blurriness of vision Ears, nose, mouth, throat, and face: Denies  mucositis or sore throat Respiratory: Denies cough, dyspnea or wheezes Cardiovascular: Denies palpitation, chest discomfort or lower extremity swelling Gastrointestinal:  Denies nausea, heartburn or change in bowel habits Skin: Denies abnormal skin rashes Lymphatics: Denies new lymphadenopathy or easy bruising MSK: (+) Right leg pain.  Neurological: (+) tingling in her feet  Behavioral/Psych: Mood is stable, no new changes  All other systems were reviewed with the patient and are negative.  MEDICAL HISTORY:  Past Medical History:  Diagnosis Date  . Abnormal Pap smear   . Anxiety   . Breast cancer ([place])   . Cancer ([place]) 1999   breast-s/p mastectomy, chemo, rad  . CIN I (cervical intraepithelial neoplasia I) 2003   by colpo  . Congenital deafness   . Deaf   . Depression   . Port-A-Cath in place 2017   for chemotherapy  . Radiation 07/12/15-07/26/15   left femur 30 Gy  . S/P bilateral oophorectomy     SURGICAL HISTORY: Past Surgical History:  Procedure Laterality Date  . ABDOMINAL HYSTERECTOMY    . INTRAMEDULLARY (IM) NAIL INTERTROCHANTERIC Left 06/26/2015   Procedure: LEFT BIOMET LONG AFFIXS NAIL;  Surgeon: [name], MD;  Location: [facility];  Service: Orthopedics;  Laterality: Left;  . IR GENERIC HISTORICAL  08/06/2015   IR US GUIDE VASC ACCESS LEFT 08/06/2015 Aletta Edouard, MD [facility]  . IR GENERIC HISTORICAL  08/06/2015   IR FLUORO GUIDE CV LINE LEFT 08/06/2015 Aletta Edouard, MD [facility]  . IR GENERIC HISTORICAL  03/05/2016   IR CV LINE INJECTION 03/05/2016 Markus Daft, MD [facility]  . LEFT HEART CATH AND CORONARY ANGIOGRAPHY N/A 12/13/2017   Procedure: LEFT HEART CATH AND CORONARY ANGIOGRAPHY;  Surgeon: Leonie Man, MD;  Location: [facility];  Service: Cardiovascular;  Laterality: N/A;  . MASTECTOMY     right breast  . MASTECTOMY    . OVARIAN CYST REMOVAL    . RADIOLOGY WITH ANESTHESIA Left 06/25/2015   Procedure: MRI OF LEFT HIP WITH OR WITHOUT  CONTRAST;  Surgeon: Medication Radiologist, MD;  Location: [facility];  Service: Radiology;  Laterality: Left;  DR. MCINTYRE/MRI  . TUBAL LIGATION      I have reviewed the social history and family history with the patient and they are unchanged from previous note.  ALLERGIES:  is allergic to promethazine hcl, chlorhexidine, and diphenhydramine hcl.  MEDICATIONS:  Current Outpatient Medications  Medication Sig Dispense Refill  . ALPRAZolam (XANAX) 0.25 MG tablet Take 1 tablet (0.25 mg total) by mouth 2 (two) times daily as needed for anxiety. 30 tablet 0  . capecitabine (XELODA) 500 MG tablet Take 3 tabs in morning, and 2 tabs in evening. Take for 14 days on, 7 days off. Repeat every 21 days. Start on 03/04/2020. 70 tablet 0  . diphenoxylate-atropine (LOMOTIL) 2.5-0.025 MG tablet Take 1-2 tablets by mouth 4 (four) times daily as needed for diarrhea or loose stools. 60 tablet 1  . lidocaine-prilocaine (  EMLA) cream Apply 1 application topically as needed. Apply to Porta-Cath 1-2 hours prior to access as directed. 90 g 2  . ondansetron (ZOFRAN) 8 MG tablet Take 1 tablet (8 mg total) by mouth every 8 (eight) hours as needed for nausea or vomiting. 45 tablet 1  . oxyCODONE (ROXICODONE) 5 MG immediate release tablet Take 1 tablet (5 mg total) by mouth every 8 (eight) hours as needed for severe pain. Refill on 02/17/2020 60 tablet 0  . potassium chloride (KLOR-CON) 10 MEQ tablet Take 3 tablets (30 mEq total) by mouth daily. 90 tablet 2  . prochlorperazine (COMPAZINE) 10 MG tablet Take 1 tablet (10 mg total) by mouth 2 (two) times daily as needed for nausea or vomiting. 20 tablet 1  . zolpidem (AMBIEN) 10 MG tablet Take 1 tablet (10 mg total) by mouth at bedtime as needed for sleep. 15 tablet 2   No current facility-administered medications for this visit.   Facility-Administered Medications Ordered in Other Visits  Medication Dose Route Frequency Provider Last Rate Last Admin  . potassium chloride SA  (K-DUR) CR tablet 40 mEq  40 mEq Oral Once Truitt Merle, MD      . sodium chloride 0.9 % 1,000 mL with potassium chloride 10 mEq infusion   Intravenous Continuous Gordy Levan, MD   Stopped at 09/10/15 1653  . sodium chloride 0.9 % injection 10 mL  10 mL Intravenous PRN Livesay, Lennis P, MD        PHYSICAL EXAMINATION: ECOG PERFORMANCE STATUS: 1 - Symptomatic but completely ambulatory  Vitals:   02/12/20 0843  BP: 129/77  Pulse: 70  Resp: 16  Temp: 97.7 F (36.5 C)  SpO2: 100%   Filed Weights   02/12/20 0843  Weight: 137 lb 9.6 oz (62.4 kg)    Due to COVID19 we will limit examination to appearance. Patient had no complaints.  GENERAL:alert, no distress and comfortable SKIN: skin color normal, no rashes or significant lesions EYES: normal, Conjunctiva are pink and non-injected, sclera clear  NEURO: alert & oriented x 3 with fluent speech   LABORATORY DATA:  I have reviewed the data as listed CBC Latest Ref Rng & Units 02/12/2020 01/26/2020 01/19/2020  WBC 4.0 - 10.5 K/uL 5.1 3.2(L) 3.8(L)  Hemoglobin 12.0 - 15.0 g/dL 10.5(L) 9.2(L) 9.7(L)  Hematocrit 36.0 - 46.0 % 31.4(L) 27.0(L) 30.0(L)  Platelets 150 - 400 K/uL 149(L) 203 192     CMP Latest Ref Rng & Units 02/12/2020 01/26/2020 01/19/2020  Glucose 70 - 99 mg/dL 88 93 89  BUN 6 - 20 mg/dL _0 Creatinine 0.44 - 1.00 mg/dL 0.86 0.87 0.87  Sodium 135 - 145 mmol/L 141 140 140  Potassium 3.5 - 5.1 mmol/L 3.0(L) 3.2(L) 3.6  Chloride 98 - 111 mmol/L 108 109 106  CO2 22 - 32 mmol/L _1 Calcium 8.9 - 10.3 mg/dL 8.8(L) 8.9 9.6  Total Protein 6.5 - 8.1 g/dL 6.5 6.4(L) 7.0  Total Bilirubin 0.3 - 1.2 mg/dL 1.6(H) 1.3(H) 1.1  Alkaline Phos 38 - 126 U/L 85 74 84  AST 15 - 41 U/L _2 ALT 0 - 44 U/L _3 RADIOGRAPHIC STUDIES: I have personally reviewed the radiological images as listed and agreed with the findings in the report. No results found.   ASSESSMENT & PLAN:  HANNAN TETZLAFF is a 55 y.o.  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 She had right mastectomy, 4 cycles of AC-T and 5 years of Tamoxifen/AI.  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 -After her first line ofchemotreatment and initial maintenance therapy, she progressed or had poor toleration of multiple lines of treatment since then. -Based on 12/13/19 PET, sherecently progressed diffusely in the bone on7th linetherapywith anti-HER2IV Margetuximabq3weekswithIV Chemo weeklyGemcitabine(Carboplatin added C3)2 weeks on/1 week offafter 5 cycles,09/15/19-12/22/19. -Ipreviouslydiscussed the longer she is on treatment the less treatment options and efficacy she will have. I discussed she has triedmost availableanti-HER2antibodiesand has some chemotherapy options remaining such as Docetaxel or oral Xeloda with Herceptin.  -She opted to proceed with Xeloda. I started her on  Xeloda 1068m BID 2 weeks on/1 week off for first cycle on 01/19/20. If tolerating well will increase. Continue Herceptin q3weeks.  -She was to increased Xeloda to 15067min the AM and 100040mn the PM with C2 on 02/12/20. I will call in refill for each cycle on day 1 and she should pick up refill her on week of chemo. She is agreeable.  -She tolerated C1 with start of mild tingling in her feet on her week off. She also notes right upper leg pain, similar to her old left thigh pain. Will monitor and if persists will obtain scan for further evaluation.  -Labs reviewed, Hg 10.5, plt 149. Overall adequate to proceed with C2 Xeloda. Proceed with Herceptin today. Will continue to watch her neuropathy.  -Continue Herceptin every 3 weeks and Zometa q3mo71monthext in 04/2020).  -F/u in 3 weeks.   2.Body pain, secondary to bone mets, Hypocalcemia, leg cramps  -She is being treated with Zometa q3mon59montht  injection in 01/2020 -She will continue oral calcium and Vit Dand Prilosec for Acid Reflux. -Current pain includes, lower back, upper legs, hips and rightshoulder.PET12/3/21does indicate disease progression in her spine, pelvis and right scapula. -Pain iswellcontrolled on 5mg O32modone1-2tabdaily. -In the past 2 weeks she has onset of right upper leg pain, similar to her old left thigh pain. She has increased oxycodone use to 4-5 tabs daily. I refilled today (02/12/20). Will monitor her pain.   3.Congenital deafness  4.Mild anemia, secondary to chemotherapy and malignancy -Moderate andStable   5.Hypokalemia, Hypomagnesium, secondary toDiarrhea -She developed diarrhea s/p cycle 3 Perjetaand again with Nerlynx, Both have been D/c. -She is onOral potassium 10meqB63mhe stopped oral Mag because it made her nauseous.  -She can take more potassium when she has diarrhea.  6.Goal of care discussion, DNR/DNI -Wepreviouslyreviewed goal of care andI recommend DNR/DNI, shepreviouslyagreed.  -I encouraged her to start setting up her living will.We previously discussed hospice care if she has no more treatment available or could not tolerate more treatment  7.Insomnia -She cannot take Benadryl due to allergy reaction. She has tried trazodone with no relief.  -She is on Xanax and Ambien10mgat 67m tabletsnightly. She knows not to take both at the same time. If she needs Oxycodone at night, she can use half tablet and an Ambien which helps her sleep. -Stable.   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 Stable. Continue monitoring   9. Mildhyperbilirubinemia  -likely second to chemotherapy, fluctuates  -Will monitor closely. Stable.   PLAN: -I refilled Oxycodone, EMLA, Xeloda and Ambien today  -Labs reviewed, Proceed with Xeloda C2 at 1500mg in 44mAM and 1000mg in t6mM, 2 weeks on/1 week off -Proceed with Herceptin today, continue every 3 weeks.   -Continue  Zometa every 3 months, next in 04/2020.  -Lab, flush, F/u and Herceptin on 2/18 (will see in infusion room). Will start C3 Xeloda on 2/21   No problem-specific Assessment & Plan notes found for this encounter.   No orders of the defined types were placed in this encounter.  All questions were answered. The patient knows to call the clinic with any problems, questions or concerns. No barriers to learning was detected. The total time spent in the appointment was 30 minutes.     Truitt Merle, MD 02/12/2020   I, Joslyn Devon, am acting as scribe for Truitt Merle, MD.   I have reviewed the above documentation for accuracy and completeness, and I agree with the above.

## 2020-02-09 NOTE — Telephone Encounter (Signed)
Telesa called this morning, cancelled her treatment today due to conflict with her child care, and requested Xeloda refill. Refill was called in to [facility] on 1/14, I called [facility] pharmacy, they have been trying to call her to set up delivery but can not reach her. I gave them her home and cell number and they will try again. I will let my nurse call her again to make sure she gets the refill, and start next Monday, will reschedule her lab, OV and Trastuzumab infusion to next week.   Truitt Merle  02/09/2020

## 2020-02-09 NOTE — Telephone Encounter (Signed)
[name] called requesting refill for Xeloda. She has cancelled her appts today secondary to a conflict with her child.

## 2020-02-12 ENCOUNTER — Inpatient Hospital Stay: Payer: Medicaid Other

## 2020-02-12 ENCOUNTER — Telehealth: Payer: Self-pay | Admitting: Hematology

## 2020-02-12 ENCOUNTER — Other Ambulatory Visit: Payer: Self-pay

## 2020-02-12 ENCOUNTER — Inpatient Hospital Stay (HOSPITAL_BASED_OUTPATIENT_CLINIC_OR_DEPARTMENT_OTHER): Payer: Medicaid Other | Admitting: Hematology

## 2020-02-12 ENCOUNTER — Encounter: Payer: Self-pay | Admitting: Hematology

## 2020-02-12 VITALS — BP 129/77 | HR 70 | Temp 97.7°F | Resp 16 | Ht 65.0 in | Wt 137.6 lb

## 2020-02-12 DIAGNOSIS — Z5112 Encounter for antineoplastic immunotherapy: Secondary | ICD-10-CM | POA: Diagnosis not present

## 2020-02-12 DIAGNOSIS — C50911 Malignant neoplasm of unspecified site of right female breast: Secondary | ICD-10-CM

## 2020-02-12 DIAGNOSIS — Z95828 Presence of other vascular implants and grafts: Secondary | ICD-10-CM

## 2020-02-12 DIAGNOSIS — C7951 Secondary malignant neoplasm of bone: Secondary | ICD-10-CM | POA: Diagnosis not present

## 2020-02-12 DIAGNOSIS — C50919 Malignant neoplasm of unspecified site of unspecified female breast: Secondary | ICD-10-CM | POA: Diagnosis not present

## 2020-02-12 DIAGNOSIS — G4700 Insomnia, unspecified: Secondary | ICD-10-CM | POA: Diagnosis not present

## 2020-02-12 LAB — CMP (CANCER CENTER ONLY)
ALT: 16 U/L (ref 0–44)
AST: 17 U/L (ref 15–41)
Albumin: 3.5 g/dL (ref 3.5–5.0)
Alkaline Phosphatase: 85 U/L (ref 38–126)
Anion gap: 8 (ref 5–15)
BUN: 6 mg/dL (ref 6–20)
CO2: 25 mmol/L (ref 22–32)
Calcium: 8.8 mg/dL — ABNORMAL LOW (ref 8.9–10.3)
Chloride: 108 mmol/L (ref 98–111)
Creatinine: 0.86 mg/dL (ref 0.44–1.00)
GFR, Estimated: >60 mL/min (ref >60)
Glucose, Bld: 88 mg/dL (ref 70–99)
Potassium: 3 mmol/L — ABNORMAL LOW (ref 3.5–5.1)
Sodium: 141 mmol/L (ref 135–145)
Total Bilirubin: 1.6 mg/dL — ABNORMAL HIGH (ref 0.3–1.2)
Total Protein: 6.5 g/dL (ref 6.5–8.1)

## 2020-02-12 LAB — CBC WITH DIFFERENTIAL (CANCER CENTER ONLY)
Abs Immature Granulocytes: 0.01 10*3/uL (ref 0.00–0.07)
Basophils Absolute: 0 10*3/uL (ref 0.0–0.1)
Basophils Relative: 0 %
Eosinophils Absolute: 0.2 10*3/uL (ref 0.0–0.5)
Eosinophils Relative: 3 %
HCT: 31.4 % — ABNORMAL LOW (ref 36.0–46.0)
Hemoglobin: 10.5 g/dL — ABNORMAL LOW (ref 12.0–15.0)
Immature Granulocytes: 0 %
Lymphocytes Relative: 20 %
Lymphs Abs: 1 10*3/uL (ref 0.7–4.0)
MCH: 32.2 pg (ref 26.0–34.0)
MCHC: 33.4 g/dL (ref 30.0–36.0)
MCV: 96.3 fL (ref 80.0–100.0)
Monocytes Absolute: 0.5 10*3/uL (ref 0.1–1.0)
Monocytes Relative: 10 %
Neutro Abs: 3.4 10*3/uL (ref 1.7–7.7)
Neutrophils Relative %: 67 %
Platelet Count: 149 10*3/uL — ABNORMAL LOW (ref 150–400)
RBC: 3.26 MIL/uL — ABNORMAL LOW (ref 3.87–5.11)
RDW: 15.4 % (ref 11.5–15.5)
WBC Count: 5.1 10*3/uL (ref 4.0–10.5)
nRBC: 0 % (ref 0.0–0.2)

## 2020-02-12 LAB — VITAMIN D 25 HYDROXY (VIT D DEFICIENCY, FRACTURES): Vit D, 25-Hydroxy: 23.68 ng/mL — ABNORMAL LOW (ref 30–100)

## 2020-02-12 MED ORDER — ACETAMINOPHEN 325 MG PO TABS
650.0000 mg | ORAL_TABLET | Freq: Once | ORAL | Status: AC
  Administered 2020-02-12: 650 mg via ORAL

## 2020-02-12 MED ORDER — SODIUM CHLORIDE 0.9% FLUSH
10.0000 mL | INTRAVENOUS | Status: DC | PRN
  Administered 2020-02-12: 10 mL
  Filled 2020-02-12: qty 10

## 2020-02-12 MED ORDER — SODIUM CHLORIDE 0.9 % IV SOLN
6.0000 mg/kg | Freq: Once | INTRAVENOUS | Status: AC
Start: 2020-02-12 — End: 2020-02-12
  Administered 2020-02-12: 357 mg via INTRAVENOUS
  Filled 2020-02-12: qty 17

## 2020-02-12 MED ORDER — LORATADINE 10 MG PO TABS
10.0000 mg | ORAL_TABLET | Freq: Once | ORAL | Status: AC
  Administered 2020-02-12: 10 mg via ORAL

## 2020-02-12 MED ORDER — SODIUM CHLORIDE 0.9% FLUSH
10.0000 mL | INTRAVENOUS | Status: DC | PRN
  Administered 2020-02-12: 10 mL via INTRAVENOUS
  Filled 2020-02-12: qty 10

## 2020-02-12 MED ORDER — CAPECITABINE 500 MG PO TABS: ORAL_TABLET | ORAL | 0 refills | Status: DC

## 2020-02-12 MED ORDER — LIDOCAINE-PRILOCAINE 2.5-2.5 % EX CREA: 1.0000 "application " | TOPICAL_CREAM | CUTANEOUS | 2 refills | Status: DC | PRN

## 2020-02-12 MED ORDER — HEPARIN SOD (PORK) LOCK FLUSH 100 UNIT/ML IV SOLN
500.0000 [IU] | Freq: Once | INTRAVENOUS | Status: AC | PRN
  Administered 2020-02-12: 500 [IU]
  Filled 2020-02-12: qty 5

## 2020-02-12 MED ORDER — LORATADINE 10 MG PO TABS
ORAL_TABLET | ORAL | Status: AC
  Filled 2020-02-12: qty 1

## 2020-02-12 MED ORDER — SODIUM CHLORIDE 0.9 % IV SOLN
Freq: Once | INTRAVENOUS | Status: AC
  Filled 2020-02-12: qty 250

## 2020-02-12 MED ORDER — ZOLPIDEM TARTRATE 10 MG PO TABS: 10.0000 mg | ORAL_TABLET | Freq: Every evening | ORAL | 2 refills | Status: DC | PRN

## 2020-02-12 MED ORDER — OXYCODONE HCL 5 MG PO TABS: 5.0000 mg | ORAL_TABLET | Freq: Three times a day (TID) | ORAL | 0 refills | Status: DC | PRN

## 2020-02-12 MED ORDER — ACETAMINOPHEN 325 MG PO TABS
ORAL_TABLET | ORAL | Status: AC
  Filled 2020-02-12: qty 2

## 2020-02-12 NOTE — Patient Instructions (Signed)
[facility] Discharge Instructions for Patients Receiving Chemotherapy  Today you received the following chemotherapy agents: trastuzumab.   To help prevent nausea and vomiting after your treatment, we encourage you to take your nausea medication as directed.   If you develop nausea and vomiting that is not controlled by your nausea medication, call the clinic.   BELOW ARE SYMPTOMS THAT SHOULD BE REPORTED IMMEDIATELY:  *FEVER GREATER THAN 100.5 F  *CHILLS WITH OR WITHOUT FEVER  NAUSEA AND VOMITING THAT IS NOT CONTROLLED WITH YOUR NAUSEA MEDICATION  *UNUSUAL SHORTNESS OF BREATH  *UNUSUAL BRUISING OR BLEEDING  TENDERNESS IN MOUTH AND THROAT WITH OR WITHOUT PRESENCE OF ULCERS  *URINARY PROBLEMS  *BOWEL PROBLEMS  UNUSUAL RASH Items with * indicate a potential emergency and should be followed up as soon as possible.  Feel free to call the clinic should you have any questions or concerns. The clinic phone number is [phone].  Please show the CHEMO ALERT CARD at check-in to the Emergency Department and triage nurse.  Zoledronic Acid injection (Hypercalcemia, Oncology) What is this medicine? ZOLEDRONIC ACID (ZOE le dron ik AS id) lowers the amount of calcium loss from bone. It is used to treat too much calcium in your blood from cancer. It is also used to prevent complications of cancer that has spread to the bone. This medicine may be used for other purposes; ask your health care provider or pharmacist if you have questions. COMMON BRAND NAME(S): Zometa What should I tell my health care provider before I take this medicine? They need to know if you have any of these conditions:  aspirin-sensitive asthma  cancer, especially if you are receiving medicines used to treat cancer  dental disease or wear dentures  infection  kidney disease  receiving corticosteroids like dexamethasone or prednisone  an unusual or allergic reaction to zoledronic acid, other  medicines, foods, dyes, or preservatives  pregnant or trying to get pregnant  breast-feeding How should I use this medicine? This medicine is for infusion into a vein. It is given by a health care professional in a hospital or clinic setting. Talk to your pediatrician regarding the use of this medicine in children. Special care may be needed. Overdosage: If you think you have taken too much of this medicine contact a poison control center or emergency room at once. NOTE: This medicine is only for you. Do not share this medicine with others. What if I miss a dose? It is important not to miss your dose. Call your doctor or health care professional if you are unable to keep an appointment. What may interact with this medicine?  certain antibiotics given by injection  NSAIDs, medicines for pain and inflammation, like ibuprofen or naproxen  some diuretics like bumetanide, furosemide  teriparatide  thalidomide This list may not describe all possible interactions. Give your health care provider a list of all the medicines, herbs, non-prescription drugs, or dietary supplements you use. Also tell them if you smoke, drink alcohol, or use illegal drugs. Some items may interact with your medicine. What should I watch for while using this medicine? Visit your doctor or health care professional for regular checkups. It may be some time before you see the benefit from this medicine. Do not stop taking your medicine unless your doctor tells you to. Your doctor may order blood tests or other tests to see how you are doing. Women should inform their doctor if they wish to become pregnant or think they might be   pregnant. There is a potential for serious side effects to an unborn child. Talk to your health care professional or pharmacist for more information. You should make sure that you get enough calcium and vitamin D while you are taking this medicine. Discuss the foods you eat and the vitamins you take  with your health care professional. Some people who take this medicine have severe bone, joint, and/or muscle pain. This medicine may also increase your risk for jaw problems or a broken thigh bone. Tell your doctor right away if you have severe pain in your jaw, bones, joints, or muscles. Tell your doctor if you have any pain that does not go away or that gets worse. Tell your dentist and dental surgeon that you are taking this medicine. You should not have major dental surgery while on this medicine. See your dentist to have a dental exam and fix any dental problems before starting this medicine. Take good care of your teeth while on this medicine. Make sure you see your dentist for regular follow-up appointments. What side effects may I notice from receiving this medicine? Side effects that you should report to your doctor or health care professional as soon as possible:  allergic reactions like skin rash, itching or hives, swelling of the face, lips, or tongue  anxiety, confusion, or depression  breathing problems  changes in vision  eye pain  feeling faint or lightheaded, falls  jaw pain, especially after dental work  mouth sores  muscle cramps, stiffness, or weakness  redness, blistering, peeling or loosening of the skin, including inside the mouth  trouble passing urine or change in the amount of urine Side effects that usually do not require medical attention (report to your doctor or health care professional if they continue or are bothersome):  bone, joint, or muscle pain  constipation  diarrhea  fever  hair loss  irritation at site where injected  loss of appetite  nausea, vomiting  stomach upset  trouble sleeping  trouble swallowing  weak or tired This list may not describe all possible side effects. Call your doctor for medical advice about side effects. You may report side effects to FDA at 1-800-FDA-1088. Where should I keep my medicine? This drug  is given in a hospital or clinic and will not be stored at home. NOTE: This sheet is a summary. It may not cover all possible information. If you have questions about this medicine, talk to your doctor, pharmacist, or health care provider.  2020 [employer] Standard (2013-05-27 14:19:39)

## 2020-02-12 NOTE — Patient Instructions (Signed)

## 2020-02-12 NOTE — Telephone Encounter (Signed)
Scheduled appointments per 1/31 los. Spoke to patient who is aware of appointments date and times. Gave patient calendar print out.  

## 2020-02-13 LAB — CANCER ANTIGEN 27.29: CA 27.29: 107.2 U/mL — ABNORMAL HIGH (ref 0.0–38.6)

## 2020-02-14 ENCOUNTER — Encounter: Payer: Self-pay | Admitting: Hematology

## 2020-02-28 NOTE — Progress Notes (Signed)
[place]   [phone] [phone]   Clinic Follow up Note   Patient Care Team: [name], [name], [name] as PCP - General (Physician Assistant) [name], MD as Consulting Physician (Orthopedic Surgery)  Date of Service:  03/01/2020  CHIEF COMPLAINT: f/u of metastatic breast cancer  SUMMARY OF ONCOLOGIC HISTORY: Oncology History Overview Note   Cancer Staging No matching staging information was found for the patient.     [name], right ([place])  11/1997 Cancer Diagnosis   Patient had 1.4 cm poorly differentiated right breast carcinoma diagnosed in Nov 1999 at age 67, 1/7 axillary nodes involved, ER/PR and HER 2 positive. She had mastectomy with the 7 axillary node evaluation, 4 cycles of adriamycin/ cytoxan followed by taxotere, then five years of tamoxifen thru June 2005 (no herceptin in 1999). She had bilateral oophorectomy in May 2005. She was briefly on aromatase inhibitor after tamoxifen, but discontinued this herself due to poor tolerance.   04/04/2015 Genetic Testing   Negative genetic testing on the breast/ovarian cancer pnael.  The Breast/Ovarian gene panel offered by [facility] includes sequencing and rearrangement analysis for the following 20 genes:  ATM, BARD1, BRCA1, BRCA2, BRIP1, CDH1, CHEK2, EPCAM, FANCC, MLH1, MSH2, MSH6, NBN, PALB2, PMS2, PTEN, RAD51C, RAD51D, TP53, and XRCC2.   06/26/2015 Pathology Results   Initial Path Report Bone, curettage, Left femur met, intramedullary subtroch tissue - METASTATIC ADENOCARCINOMA.  Estrogen Receptor: 95%, POSITIVE, STRONG STAINING INTENSITY Progesterone Receptor: 2%, POSITIVE, STRONG  HER2 (+), IHC 3+   06/26/2015 Surgery   Biopsy of metastatic tissue and stabilization with Affixus trochanteric nail, proximal and distal interlock of left femur by Dr. [name]    06/28/2015 Imaging   CT Chest w/ Contrast 1. Extensive right internal mammary chain  lymphadenopathy consistent with metastatic breast cancer. 2. Lytic metastases involving the posterior elements at T1 with probable nondisplaced pathologic fracture of the spinous process. 3. No other evidence of thoracic metastatic disease. 4. Trace bilateral pleural effusions with associated bibasilar atelectasis. 5. Indeterminate right thyroid nodule, not previously imaged. This could be evaluated with thyroid ultrasound as clinically warranted.   06/29/2015 -  Chemotherapy   Zometa started monthly on 06/29/15 and switched to q74months from 01/08/17   07/01/2015 Imaging   Bone Scan 1. Increased activity noted throughout the left femur. Although metastatic disease cannot be excluded, these changes are most likely secondary to prior surgery. 2. No other focal abnormalities identified to suggest metastatic disease.   07/12/2015 - 07/26/2015 Radiation Therapy   Left femur, 30 Gy in 10 fractions by Dr. Sondra Come    07/14/2015 Initial Diagnosis   [name], right ([place])   07/19/2015 Imaging   Initial PET Scan 1. Severe multifocal osseous metastatic disease, much greater than anticipated based on prior imaging. 2. Multifocal nodal metastases involving the right internal mammary, prevascular and subpectoral lymph nodes. No axillary pulmonary involvement identified. 3. No distant extra osseous metastases.   07/30/2015 - 11/29/2015 Chemotherapy   Docetaxel, Herceptin and Perjeta every 3 weeks X6 cycles, pt tolerated moderately well, restaging scan showed excellent response    09/18/2015 Imaging   CT Head w/wo Contrast 1. No acute intracranial abnormality or significant interval change. 2. Stable minimal periventricular white matter hypoattenuation on the right. This may reflect a remote ischemic injury. 3. No evidence for metastatic disease to the brain. 4. No focal soft tissue lesion to explain the patient's tenderness.   10/30/2015 Imaging  CT Abdomen Pelvis w/  Contrast 1. Few mildly prominent fluid-filled loops of small bowel scattered throughout the abdomen, with intraluminal fluid density within the distal colon. Given the provided history, findings are suggestive of acute enteritis/diarrheal illness. 2. No other acute intra-abdominal or pelvic process identified. 3. Widespread osseous metastatic disease, better evaluated on most recent PET-CT from 07/19/2015. No pathologic fracture or other complication. 4. 11 mm hypodensity within the left kidney. This lesion measures intermediate density, and is indeterminate. While this lesion is similar in size relative to recent studies, this is increased in size relative to prior study from 2012. Further evaluation with dedicated renal mass protocol CT and/or MRI is recommended for complete characterization.   12/2015 - 05/14/2016 Chemotherapy   Maintenance Herceptin and pejeta every 3 weeks stopped on 05/14/16 due to disease progression   12/25/2015 Imaging   Restaging PET Scan 1. Markedly improved skeletal activity, with only several faint foci of residual accentuated metabolic activity, but resolution of the vast majority of the previously extensive osseous metastatic disease. 2. Resolution of the prior right internal mammary and right prevascular lymph nodes. 3. Residual subcutaneous edema and edema along fascia planes in the left upper thigh. This remains somewhat more than I would expect for placement of an IM nail 6 weeks ago. If there is leg swelling further down, consider Doppler venous ultrasound to rule out left lower extremity DVT. I do not perceive an obvious difference in density between the pelvic and common femoral veins on the noncontrast CT data. 4. Chronic right maxillary and right sphenoid sinusitis.   01/16/2016 - 10/20/2017 Anti-estrogen oral therapy   Letrozole 2.5 mg daily. She was switched to exemestane due to disease progression on 02/08/17. Stopped on 10/20/2017 when treatment changed to chemo     03/01/2016 Miscellaneous   Patient presented to ED following a fall; she reports she landed on her back and is now experiencing back pain. The patient was evaluated and discharge home same day with pain control.   05/01/2016 Imaging   CT CAP IMPRESSION: 1. Stable exam.  No new or progressive disease identified. 2. No evidence for residual or recurrent adenopathy within the chest. 3. Stable bone metastasis.   05/01/2016 Imaging   BONE SCAN IMPRESSION: 1. Small focal uptake involving the anterior rib ends of the left second and right fourth ribs, new since the prior bone scan. The location of this uptake is more suggestive of a traumatic or inflammatory etiology as opposed to metastatic disease. No other evidence of metastatic disease. 2. There are other areas of stable uptake which are most likely degenerative/reactive, including right shoulder and cervical spine uptake and uptake along the right femur adjacent to the ORIF hardware.   05/20/2016 Imaging   NM PET Skull to Thigh IMPRESSION: 1. There multiple metastatic foci in the bony pelvis, including some new abnormal foci compare to the prior PET-CT, compatible with mildly progressive bony metastatic disease. Also, a left T11 vertebral hypermetabolic metastatic lesion is observed, new compared to the prior exam. 2. No active extraosseous malignancy is currently identified. 3. Right anterior fourth rib healing fracture, appears be   06/04/2016 - 01/08/2017 Chemotherapy   Second line chemotherapy Kadcyla every 3 weeks started on 06/04/16 and stopped on 01/08/17 due to mixed response.     10/05/2016 Imaging   CT CAP and Bone Scan:  Bones: left intramedullary rod and left femoral head neck screw in place. In the proximal diaphysis of the left femur there is cortical thickening and   irregularity. No lytic or blastic bone lesions. No fracture or vertebral endplate destruction.   No definite evidence of recurrent disease in the  chest, abdomen, or pelvis.   10/28/2016 Imaging   CT A/P IMPRESSION: No acute process demonstrated in the abdomen or pelvis. No evidence of bowel obstruction or inflammation.   11/04/2016 Imaging   DG foot complete left: IMPRESSION: No fracture or dislocation.  No soft tissue abnormality   11/23/2016 Imaging   CT RIGHT FEMUR IMPRESSION: Negative CT scan of the right thigh.  No visible metastatic disease.  CT LEFT FEMUR IMPRESSION: 1. Postsurgical changes related to prior cephalomedullary rod fixation of the left femur with linear lucency along the anterolateral proximal femoral cortex at level of the lesser trochanter, suspicious for nondisplaced fracture. 2. Slightly permeative appearance of the proximal left femoral cortex at the site of known prior osseous metastases is largely unchanged. 3. Unchanged patchy lucency and sclerosis along the anterior acetabulum, which appears to correspond with an area of faint uptake on the prior PET-CT, suspicious for osseous metastasis. These results will be called to the ordering clinician or representative by the Radiologist Assistant, and communication documented in the PACS or zVision Dashboard.     01/27/2017 Imaging   IMPRESSION: 1. Mixed response to osseous metastasis. Some hypermetabolic lesions are new and increasingly hypermetabolic, while another index lesion is decreasingly hypermetabolic. 2. No evidence of hypermetabolic soft tissue metastasis.    02/08/2017 - 10/19/2017 Antibody Plan   -Herceptin every 3 week since 02/08/17 -Oral Lanpantinib 1562m and decreased to 5045mdue to diarrhea and depression. Due to disease progression she swithced to Verzenio 15035mn 05/04/17. She did not toelrate well so she was switched to Ibrance 100m50m 05/31/17. Due to her neutropenia, Ibrance dose reduced to 75 mg daily, 3 weeks on, one-week off, stopped on 10/20/2017 due to disease progression.    04/30/2017 PET scan   IMPRESSION: 1.  Primarily increase in hypermetabolism of osseous metastasis. An isolated right acetabular lesion is decreased in hypermetabolism since the prior exam. 2. No evidence of hypermetabolic soft tissue metastasis. 3. Similar non FDG avid right-sided thyroid nodule, favoring a benign etiology. Recommend attention on follow-up.   08/30/2017 PET scan   08/30/2017 PET Scan  IMPRESSION: 1. Worsening osseous metastatic disease. 2. Focal hypermetabolism in the central spinal canal the L1 level, similar to the prior exam. Metastatic implant cannot be excluded. 3. Slight enlargement of a right thyroid nodule which now appears more cystic in character. Consider further evaluation with thyroid ultrasound. If patient is clinically hyperthyroid, consider nuclear medicine thyroid uptake and scan   08/30/2017 Progression   08/30/2017 PET Scan  IMPRESSION: 1. Worsening osseous metastatic disease. 2. Focal hypermetabolism in the central spinal canal the L1 level, similar to the prior exam. Metastatic implant cannot be excluded. 3. Slight enlargement of a right thyroid nodule which now appears more cystic in character. Consider further evaluation with thyroid ultrasound. If patient is clinically hyperthyroid, consider nuclear medicine thyroid uptake and scan   09/29/2017 - 10/11/2017 Radiation Therapy   Pt received 27 Gy in 9 fractions directed at the areas of significant uptake noted on recent bone scan the right pelvis and right proximal femur, with Dr, KinaSondra Come10/09/2017 - 04/07/2018 Chemotherapy   -Herceptin every 3 weeks starting 02/08/17, perjeta added on 10/20/2017  -weekly Taxol started on 10/21/2017. Will reduce Taxol to 2 weeks on, 1 week off starting on 02/04/17.  Stoped due to mild disease progression  10/27/2017 Imaging   10/27/2017 CT AP IMPRESSION: 1. No acute abnormality. No evidence of bowel obstruction or acute bowel inflammation. Normal appendix. 2. Stable patchy sclerotic osseous  metastases throughout the visualized skeleton. No new or progressive metastatic disease in the abdomen or pelvis.   01/11/2018 Imaging   Whole Body Bone Scan 01/11/18  IMPRESSION: 1. Multifocal osseous metastases as described. 2. Right scapular metastasis. 3. Right superior thoracic spinous process. 4. Right sacral ala and right L5 metastases. 5. Right acetabular metastasis. 6. Right proximal femur metastasis. 7. Diffuse metastases throughout the left femur. 8. Anterior right fourth rib metastasis.   01/11/2018 Imaging   CT CAP W Contrast 01/11/18  IMPRESSION: 1. Similar-appearing osseous metastatic disease. 2. No evidence for progressed metastatic disease in the chest, abdomen or pelvis.   03/30/2018 Imaging   Bone Scan 03/30/18  IMPRESSION: Stable multifocal bony metastatic disease.   03/30/2018 Imaging    CT CAP W Contrast 03/30/18 IMPRESSION: 1. New mild contiguous wall thickening with associated mucosal hyperenhancement throughout the left colon and rectum, suggesting nonspecific infectious or inflammatory proctocolitis. Differential includes C diff colitis. 2. Stable faint patchy sclerosis in the iliac bones and lumbar vertebral bodies. No new or progressive osseous metastatic disease by CT. Please note that the osseous lesions are relatively occult by CT and would be better evaluated by PET-CT, as clinically warranted. 3. No evidence of extra osseous metastatic disease.  These results will be called to the ordering clinician or representative by the Radiologist Assistant, and communication documented in the PACS or zVision Dashboard.   04/15/2018 - 06/16/2018 Chemotherapy   Enhertu q3weeks starting 04/15/18. Stopped Enhertu after cycle 3 on 05/27/18 due to poor toleration due to poor toleration with anorexia, weight loss, N&V, diarrhea   04/21/2018 PET scan   PET 04/21/18  IMPRESSION: 1. Overall marked interval improvement in bony hypermetabolic disease. All of the  previously identified hypermetabolic osseous metastases have decreased in hypermetabolism in the interval. There is a new tiny focus of hypermetabolism in the right aspect of the L3 vertebral body that appears to correspond to a new 8 mm lucency, raising concern for new metastatic disease. Close attention on follow-up recommended. 2. Focal hypermetabolism in the central spinal canal at the level of L1 previously has resolved. 3. No hypermetabolic soft tissue disease on today's study. 4. Right thyroid nodule seen previously has decreased in the interval.    06/16/2018 Imaging   CT LUMBAR SPINE WO CONTRAST 06/16/18 IMPRESSION: 1. Transitional lumbosacral anatomy. Correlation with radiographs is recommended prior to any operative intervention. 2. Suspicion of a right L3 vertebral body metastasis on the April PET-CT is not evident by CT. Stable degenerative versus metastatic lesion at the right L5 superior endplate. Small but increased right iliac bone metastasis since March. Lumbar MRI would best characterize lumbar metastatic disease (without contrast if necessary). 3. Suspect L2-L3 left subarticular segment disc herniation. Query left L2 radiculitis. 4. Stable appearance of rightward L4-L5 disc degeneration which could affect the right foramen and right lateral recess.   06/17/2018 - 11/16/2018 Chemotherapy   Restart Taxol 2 weeks on/1 week off with Herceptin and Perjeta q3weeks on 06/17/18. Perjeta was stopped after C5 due to diarrhea. Stopped Taxol after 11/16/18 due to disease progression to L3.    07/06/2018 PET scan   PET  IMPRESSION: 1. Near total resolution of hypermetabolic activity associated with skeletal metastasis. Very mild activity associated with the LEFT iliac bone which is similar to background blood pool activity. 2. No new   or progressive disease.   11/21/2018 PET scan   IMPRESSION: 1. New focal hypermetabolic lesion eccentric to the right in the L3 vertebral body,  maximum SUV 6.1 compatible with a new focus of active malignancy. 2. Very low-grade activity similar to blood pool in previous existing mildly sclerotic metastatic lesions including the left iliac bone lesion. Some of the mildly sclerotic lesions are not hypermetabolic compatible with effectively treated bony metastatic disease. 3. No hypermetabolic soft tissue lesions are identified.     11/30/2018 - 08/25/2019 Chemotherapy   Continue Herceptin q3weeks. Stopped 08/25/19 due to significant disease progression and switched to  IV Matuzumab   12/12/2018 - 12/17/2018 Chemotherapy   Tucatinib (TUKYSA) 6 tabs BID as a next line therapy starting 12/12/18. Stopped 12/17/18 duet o N&V, poor appetite and HA    12/21/2018 - 03/17/2019 Chemotherapy   IV Eribulin on day 1, 8 every 21 days starting 12/21/18. Stopped 03/17/19 due to mixed response to treatment.    03/21/2019 PET scan   IMPRESSION: 1. Mixed response to therapy of osseous metastasis. Although a right-sided L4 lesion demonstrates decreased hypermetabolism today, new left iliac and right ischial hypermetabolic lesions are seen. 2. No evidence of hypermetabolic soft tissue metastasis.     03/27/2019 - 07/21/2019 Chemotherapy   Neratinib (Nerlynx) titrate up from 120mg  to 240mg  starting 03/27/19. She could not tolerate 160mg  and stopped on 02/05/19 due to N&V and diarrhea. Will restart at 120mg  on 04/10/18-04/27/19 and 4/19-4/22. Reduced to 80mg  once daily on 05/10/19. Held/stopped since 07/21/19 due to dry cough, Nausea and body aches.   06/23/2019 Imaging   CT Head  IMPRESSION: 1. No acute intracranial abnormality or metastatic disease identified. 2. Chronic white matter disease in the right hemisphere is stable from prior exams.     09/05/2019 PET scan   IMPRESSION: 1. Significant progression of widespread hypermetabolic osseous metastases throughout the axial skeleton as detailed. 2. No hypermetabolic extraosseous sites of metastatic disease.    09/15/2019 - 12/22/2019 Chemotherapy   -IV Margetuximab q3weeks and IV Gemcitabine 2 weeks on/1 week off starting 09/15/19. Carboplatin added to Gemcitabine starting with C3 on 11/03/19. D/c after C5 due to disease progression in bone.    12/13/2019 Imaging   PET  IMPRESSION: 1. Interval progression of widespread, FDG avid osseous metastasis involving the axial skeleton. 2. No FDG avid extra osseous sites of metastatic disease.     01/19/2020 -  Chemotherapy   Xeloda BID 2 weeks on/1 week off and Herpcetin q3weeks starting 01/19/20  with C1 at 1000mg  BID and will increase to 1500mg  BID if tolerable.       CURRENT THERAPY:  -Zometa q76months -Xeloda BID 2 weeks on/1 week off and Herceptin q3weeks starting1/7/22.C1 at 1000mg  BID and will increase to 1500mg  BID if tolerable.C2 increased to 1500mg  in the AM and 1000mg  in the PM starting 02/12/20.   INTERVAL HISTORY:  [name] is here for a follow up. She presents to the clinic with her interpretor. She brought Protandam supplement for pharmacy to look at. She has not started taking it yet. She notes she tolerated the Xeloda dose increase well. However, she had mild diarrhea. She is concerned this diarrhea will worsen with further dose increase. She notes 6 days ago she forgot to take her medication for only that day, but took for 14 days. Her last pill was taken on 02/27/20. She feels [facility] has lied to her on multiple occasions, but would like to change her pharmacy to [facility]. She continues  oxycodone for pain. She notes she takes Oxycodone 1-3 tabs daily, 2 tabs during the day and 1 tab at night. She notes with Ambien she sleeps better when it goes with Oxycodone.     REVIEW OF SYSTEMS:   Constitutional: Denies fevers, chills or abnormal weight loss Eyes: Denies blurriness of vision Ears, nose, mouth, throat, and face: Denies mucositis or sore throat Respiratory: Denies cough, dyspnea or wheezes Cardiovascular: Denies palpitation,  chest discomfort or lower extremity swelling Gastrointestinal:  Denies nausea, heartburn or change in bowel habits Skin: Denies abnormal skin rashes Lymphatics: Denies new lymphadenopathy or easy bruising Neurological:Denies numbness, tingling or new weaknesses Behavioral/Psych: Mood is stable, no new changes  All other systems were reviewed with the patient and are negative.  MEDICAL HISTORY:  Past Medical History:  Diagnosis Date  . Abnormal Pap smear   . Anxiety   . Breast cancer ([place])   . Cancer ([place]) 1999   breast-s/p mastectomy, chemo, rad  . CIN I (cervical intraepithelial neoplasia I) 2003   by colpo  . Congenital deafness   . Deaf   . Depression   . Port-A-Cath in place 2017   for chemotherapy  . Radiation 07/12/15-07/26/15   left femur 30 Gy  . S/P bilateral oophorectomy     SURGICAL HISTORY: Past Surgical History:  Procedure Laterality Date  . ABDOMINAL HYSTERECTOMY    . INTRAMEDULLARY (IM) NAIL INTERTROCHANTERIC Left 06/26/2015   Procedure: LEFT BIOMET LONG AFFIXS NAIL;  Surgeon: [name], MD;  Location: [facility];  Service: Orthopedics;  Laterality: Left;  . IR GENERIC HISTORICAL  08/06/2015   IR US GUIDE VASC ACCESS LEFT 08/06/2015 Debra Edouard, MD [facility]  . IR GENERIC HISTORICAL  08/06/2015   IR FLUORO GUIDE CV LINE LEFT 08/06/2015 Debra Edouard, MD [facility]  . IR GENERIC HISTORICAL  03/05/2016   IR CV LINE INJECTION 03/05/2016 Markus Daft, MD [facility]  . LEFT HEART CATH AND CORONARY ANGIOGRAPHY N/A 12/13/2017   Procedure: LEFT HEART CATH AND CORONARY ANGIOGRAPHY;  Surgeon: Leonie Man, MD;  Location: [facility];  Service: Cardiovascular;  Laterality: N/A;  . MASTECTOMY     right breast  . MASTECTOMY    . OVARIAN CYST REMOVAL    . RADIOLOGY WITH ANESTHESIA Left 06/25/2015   Procedure: MRI OF LEFT HIP WITH OR WITHOUT CONTRAST;  Surgeon: Medication Radiologist, MD;  Location: [facility];  Service: Radiology;  Laterality: Left;  DR.  MCINTYRE/MRI  . TUBAL LIGATION      I have reviewed the social history and family history with the patient and they are unchanged from previous note.  ALLERGIES:  is allergic to promethazine hcl, chlorhexidine, and diphenhydramine hcl.  MEDICATIONS:  Current Outpatient Medications  Medication Sig Dispense Refill  . ALPRAZolam (XANAX) 0.25 MG tablet Take 1 tablet (0.25 mg total) by mouth 2 (two) times daily as needed for anxiety. 30 tablet 0  . capecitabine (XELODA) 500 MG tablet Take 3 tabs in morning, and 2 tabs in evening. Take for 14 days on, 7 days off. Repeat every 21 days. Start on 03/04/2020. 70 tablet 0  . diphenoxylate-atropine (LOMOTIL) 2.5-0.025 MG tablet Take 1-2 tablets by mouth 4 (four) times daily as needed for diarrhea or loose stools. 60 tablet 1  . lidocaine-prilocaine (EMLA) cream Apply 1 application topically as needed. Apply to Porta-Cath 1-2 hours prior to access as directed. 90 g 2  . ondansetron (ZOFRAN) 8 MG tablet Take 1 tablet (8 mg total) by  mouth every 8 (eight) hours as needed for nausea or vomiting. 45 tablet 1  . oxyCODONE (ROXICODONE) 5 MG immediate release tablet Take 1 tablet (5 mg total) by mouth every 8 (eight) hours as needed for severe pain. Refill on 02/17/2020 90 tablet 0  . potassium chloride (KLOR-CON) 10 MEQ tablet Take 3 tablets (30 mEq total) by mouth daily. 90 tablet 2  . prochlorperazine (COMPAZINE) 10 MG tablet Take 1 tablet (10 mg total) by mouth 2 (two) times daily as needed for nausea or vomiting. 20 tablet 1  . zolpidem (AMBIEN) 10 MG tablet Take 1 tablet (10 mg total) by mouth at bedtime as needed for sleep. 15 tablet 2   No current facility-administered medications for this visit.   Facility-Administered Medications Ordered in Other Visits  Medication Dose Route Frequency Provider Last Rate Last Admin  . potassium chloride SA (K-DUR) CR tablet 40 mEq  40 mEq Oral Once Truitt Merle, MD      . sodium chloride 0.9 % 1,000 mL with potassium  chloride 10 mEq infusion   Intravenous Continuous Gordy Levan, MD   Stopped at 09/10/15 1653  . sodium chloride 0.9 % injection 10 mL  10 mL Intravenous PRN Livesay, Lennis P, MD      . sodium chloride flush (NS) 0.9 % injection 10 mL  10 mL Intracatheter PRN Truitt Merle, MD   10 mL at 03/01/20 0957    PHYSICAL EXAMINATION: ECOG PERFORMANCE STATUS: 1 - Symptomatic but completely ambulatory  There were no vitals filed for this visit. There were no vitals filed for this visit.  Due to [place] we will limit examination to appearance. Patient had no complaints.  GENERAL:alert, no distress and comfortable SKIN: skin color normal, no rashes or significant lesions EYES: normal, Conjunctiva are pink and non-injected, sclera clear  NEURO: alert & oriented x 3 with fluent speech   LABORATORY DATA:  I have reviewed the data as listed CBC Latest Ref Rng & Units 03/01/2020 02/12/2020 01/26/2020  WBC 4.0 - 10.5 K/uL 3.7(L) 5.1 3.2(L)  Hemoglobin 12.0 - 15.0 g/dL 10.7(L) 10.5(L) 9.2(L)  Hematocrit 36.0 - 46.0 % 31.2(L) 31.4(L) 27.0(L)  Platelets 150 - 400 K/uL 156 149(L) 203     CMP Latest Ref Rng & Units 03/01/2020 02/12/2020 01/26/2020  Glucose 70 - 99 mg/dL 91 88 93  BUN 6 - 20 mg/dL $Remove'12 6 9  'NYqZoYQ$ Creatinine 0.44 - 1.00 mg/dL 0.91 0.86 0.87  Sodium 135 - 145 mmol/L 139 141 140  Potassium 3.5 - 5.1 mmol/L 3.1(L) 3.0(L) 3.2(L)  Chloride 98 - 111 mmol/L 108 108 109  CO2 22 - 32 mmol/L $RemoveB'23 25 23  'WVmzlZTH$ Calcium 8.9 - 10.3 mg/dL 9.1 8.8(L) 8.9  Total Protein 6.5 - 8.1 g/dL 6.7 6.5 6.4(L)  Total Bilirubin 0.3 - 1.2 mg/dL 2.6(H) 1.6(H) 1.3(H)  Alkaline Phos 38 - 126 U/L 86 85 74  AST 15 - 41 U/L $Remo'16 17 15  'uofNp$ ALT 0 - 44 U/L $Remo'14 16 12      'JvzRW$ RADIOGRAPHIC STUDIES: I have personally reviewed the radiological images as listed and agreed with the findings in the report. No results found.   ASSESSMENT & PLAN:  ASANTI CRAIGO is a 55 y.o. 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 She had right mastectomy, 4 cycles of AC-T and 5 years of Tamoxifen/AI.  -In 06/2015 she had right breast cancer  recurrence metastatic to the bone. -After her first line ofchemotreatment and initial maintenance therapy, she progressed or had poor toleration of multiple lines of treatment since then. -Based on 12/13/19 PET, sherecently progressed diffusely in the bone on7th linetherapywith anti-HER2IV Margetuximabq3weekswithIV Chemo weeklyGemcitabine(Carboplatin added C3)2 weeks on/1 week offafter 5 cycles,09/15/19-12/22/19. -Ipreviouslydiscussed the longer she is on treatment the less treatment options and efficacy she will have. I discussed she has triedmost availableanti-HER2antibodiesand has some chemotherapy options remaining such as Docetaxel or oral Xeloda with Herceptin. -she started her onXeloda 1000mg  BID 2 weeks on/1 week off for first cycle on 01/19/20. If tolerating well will increase.Continue Herceptin q3weeks. -She tolerated Xeloda increase to 1500mg  in the AM and 1000mg  in the PM well with C2. She did have mild diarrhea and given concern this will worsen on higher dose Xeloda, she does not want to further increase dose.  -Labs reviewed and stable. Overall adequate to proceed with Herceptin today.  -Start C3 Xeloda at same dose 1500mg  in the AM and 1000mg  in the PM on 2/22 -Continue Herceptin every 3 weeks and Zometa q33months (next in 04/2020).  -F/u in 3 weeks.   2.Body pain, secondary to bone mets, Hypocalcemia, leg cramps  -She is being treated with Zometa q31months.Next injection in 01/2020 -She will continue oral calcium and Vit Dand Prilosec for Acid Reflux. -Current pain includes, lower back, upper legs, hips and rightshoulder.PET12/3/21does indicate disease progression in her spine, pelvis and right scapula. -Pain iswellcontrolled on 5mg   Oxycodone1-2tabdaily. -In the past 2 weeks she had onset of right upper leg pain, similar to her old left thigh pain.  -She uses Oxycodone 2 tabs during the day and 1 tab at night. I recommend she only use half tablet of oxycodone at night given she uses Ambien at night.   3.Congenital deafness  4.Mild anemia, secondary to chemotherapy and malignancy -Moderate andStable   5.Hypokalemia, Hypomagnesium, secondary toDiarrhea -She developed diarrhea s/p cycle 3 Perjetaand again with Nerlynx, Both have been D/c. -She is onOral potassium 40meqBID.She stopped oral Mag because it made her nauseous.  -She can take more potassium when she has diarrhea. -K low today, I encourage her to increase oral KCL by 41meq daily   6.Goal of care discussion, DNR/DNI -Wepreviouslyreviewed goal of care andI recommend DNR/DNI, shepreviouslyagreed.  -I encouraged her to start setting up her living will.We previously discussed hospice care if she has no more treatment available or could not tolerate more treatment  7.Insomnia -She cannot take Benadryl due to allergy reaction. She has tried trazodone with no relief.  -She is on Xanax and on Ambien10mg at half tabletsnightly. She knows not to take both at the same time. If she needs Oxycodone at night, she can use half tablet only. I reviewed with her today (03/01/20).  -Stable.  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 Continue monitoring. Stable.   9. Mildhyperbilirubinemia  -likely second to chemotherapy, fluctuates  -Will monitor closely. Stable.   PLAN: -Will ask pharmacy about supplement  -I refilled Xeloda and Oxycodone today  -Labs reviewed, Proceed with Herceptin today. -Start Xeloda C3 at 1500mg  in the AM and 1000mg  in the PM, 2 weeks on/1 week off starting 2/22 -Continue Herceptin every 3 weeks.  -Continue Zometa every 3 months, next in 04/2020.  -Lab, flush, F/u and Herceptin in 3 and 6 weeks. Start next cycle  Xeloda on the following Monday or Tuesday.    No problem-specific Assessment & Plan notes found for this encounter.   No orders of the defined types were  placed in this encounter.  All questions were answered. The patient knows to call the clinic with any problems, questions or concerns. No barriers to learning was detected. The total time spent in the appointment was 30 minutes.     Truitt Merle, MD 03/01/2020   I, Joslyn Devon, am acting as scribe for Truitt Merle, MD.   I have reviewed the above documentation for accuracy and completeness, and I agree with the above.

## 2020-03-01 ENCOUNTER — Inpatient Hospital Stay: Payer: Medicaid Other

## 2020-03-01 ENCOUNTER — Inpatient Hospital Stay: Payer: Medicaid Other | Attending: Hematology

## 2020-03-01 ENCOUNTER — Inpatient Hospital Stay (HOSPITAL_BASED_OUTPATIENT_CLINIC_OR_DEPARTMENT_OTHER): Payer: Medicaid Other | Admitting: Hematology

## 2020-03-01 ENCOUNTER — Other Ambulatory Visit: Payer: Self-pay | Admitting: Hematology

## 2020-03-01 ENCOUNTER — Telehealth: Payer: Self-pay | Admitting: Hematology

## 2020-03-01 ENCOUNTER — Other Ambulatory Visit: Payer: Self-pay

## 2020-03-01 ENCOUNTER — Ambulatory Visit: Payer: Medicaid Other

## 2020-03-01 ENCOUNTER — Encounter: Payer: Self-pay | Admitting: Hematology

## 2020-03-01 VITALS — BP 129/79 | HR 71 | Temp 97.8°F | Resp 16 | Wt 133.0 lb

## 2020-03-01 DIAGNOSIS — C7951 Secondary malignant neoplasm of bone: Secondary | ICD-10-CM | POA: Insufficient documentation

## 2020-03-01 DIAGNOSIS — E876 Hypokalemia: Secondary | ICD-10-CM | POA: Diagnosis not present

## 2020-03-01 DIAGNOSIS — C50911 Malignant neoplasm of unspecified site of right female breast: Secondary | ICD-10-CM

## 2020-03-01 DIAGNOSIS — G620 Drug-induced polyneuropathy: Secondary | ICD-10-CM | POA: Diagnosis not present

## 2020-03-01 DIAGNOSIS — Z95828 Presence of other vascular implants and grafts: Secondary | ICD-10-CM

## 2020-03-01 DIAGNOSIS — H905 Unspecified sensorineural hearing loss: Secondary | ICD-10-CM | POA: Diagnosis not present

## 2020-03-01 DIAGNOSIS — C50919 Malignant neoplasm of unspecified site of unspecified female breast: Secondary | ICD-10-CM

## 2020-03-01 DIAGNOSIS — Z170 Estrogen receptor positive status [ER+]: Secondary | ICD-10-CM | POA: Insufficient documentation

## 2020-03-01 DIAGNOSIS — G4700 Insomnia, unspecified: Secondary | ICD-10-CM | POA: Insufficient documentation

## 2020-03-01 DIAGNOSIS — Z5112 Encounter for antineoplastic immunotherapy: Secondary | ICD-10-CM | POA: Diagnosis present

## 2020-03-01 DIAGNOSIS — D6481 Anemia due to antineoplastic chemotherapy: Secondary | ICD-10-CM | POA: Diagnosis not present

## 2020-03-01 LAB — CBC WITH DIFFERENTIAL (CANCER CENTER ONLY)
Abs Immature Granulocytes: 0.01 10*3/uL (ref 0.00–0.07)
Basophils Absolute: 0 10*3/uL (ref 0.0–0.1)
Basophils Relative: 0 %
Eosinophils Absolute: 0.1 10*3/uL (ref 0.0–0.5)
Eosinophils Relative: 3 %
HCT: 31.2 % — ABNORMAL LOW (ref 36.0–46.0)
Hemoglobin: 10.7 g/dL — ABNORMAL LOW (ref 12.0–15.0)
Immature Granulocytes: 0 %
Lymphocytes Relative: 33 %
Lymphs Abs: 1.2 10*3/uL (ref 0.7–4.0)
MCH: 32.8 pg (ref 26.0–34.0)
MCHC: 34.3 g/dL (ref 30.0–36.0)
MCV: 95.7 fL (ref 80.0–100.0)
Monocytes Absolute: 0.3 10*3/uL (ref 0.1–1.0)
Monocytes Relative: 9 %
Neutro Abs: 2 10*3/uL (ref 1.7–7.7)
Neutrophils Relative %: 55 %
Platelet Count: 156 10*3/uL (ref 150–400)
RBC: 3.26 MIL/uL — ABNORMAL LOW (ref 3.87–5.11)
RDW: 15.6 % — ABNORMAL HIGH (ref 11.5–15.5)
WBC Count: 3.7 10*3/uL — ABNORMAL LOW (ref 4.0–10.5)
nRBC: 0 % (ref 0.0–0.2)

## 2020-03-01 LAB — CMP (CANCER CENTER ONLY)
ALT: 14 U/L (ref 0–44)
AST: 16 U/L (ref 15–41)
Albumin: 4 g/dL (ref 3.5–5.0)
Alkaline Phosphatase: 86 U/L (ref 38–126)
Anion gap: 8 (ref 5–15)
BUN: 12 mg/dL (ref 6–20)
CO2: 23 mmol/L (ref 22–32)
Calcium: 9.1 mg/dL (ref 8.9–10.3)
Chloride: 108 mmol/L (ref 98–111)
Creatinine: 0.91 mg/dL (ref 0.44–1.00)
GFR, Estimated: >60 mL/min (ref >60)
Glucose, Bld: 91 mg/dL (ref 70–99)
Potassium: 3.1 mmol/L — ABNORMAL LOW (ref 3.5–5.1)
Sodium: 139 mmol/L (ref 135–145)
Total Bilirubin: 2.6 mg/dL — ABNORMAL HIGH (ref 0.3–1.2)
Total Protein: 6.7 g/dL (ref 6.5–8.1)

## 2020-03-01 MED ORDER — HEPARIN SOD (PORK) LOCK FLUSH 100 UNIT/ML IV SOLN
500.0000 [IU] | Freq: Once | INTRAVENOUS | Status: AC | PRN
  Administered 2020-03-01: 500 [IU]
  Filled 2020-03-01: qty 5

## 2020-03-01 MED ORDER — CAPECITABINE 500 MG PO TABS: ORAL_TABLET | ORAL | 0 refills | Status: DC

## 2020-03-01 MED ORDER — LORATADINE 10 MG PO TABS
ORAL_TABLET | ORAL | Status: AC
  Filled 2020-03-01: qty 1

## 2020-03-01 MED ORDER — ACETAMINOPHEN 325 MG PO TABS
650.0000 mg | ORAL_TABLET | Freq: Once | ORAL | Status: AC
  Administered 2020-03-01: 650 mg via ORAL

## 2020-03-01 MED ORDER — TRASTUZUMAB-DKST CHEMO 150 MG IV SOLR
6.0000 mg/kg | Freq: Once | INTRAVENOUS | Status: AC
  Administered 2020-03-01: 357 mg via INTRAVENOUS
  Filled 2020-03-01: qty 17

## 2020-03-01 MED ORDER — OXYCODONE HCL 5 MG PO TABS: 5.0000 mg | ORAL_TABLET | Freq: Three times a day (TID) | ORAL | 0 refills | Status: DC | PRN

## 2020-03-01 MED ORDER — SODIUM CHLORIDE 0.9% FLUSH
10.0000 mL | Freq: Once | INTRAVENOUS | Status: AC
  Administered 2020-03-01: 10 mL
  Filled 2020-03-01: qty 10

## 2020-03-01 MED ORDER — LORATADINE 10 MG PO TABS
10.0000 mg | ORAL_TABLET | Freq: Once | ORAL | Status: AC
  Administered 2020-03-01: 10 mg via ORAL

## 2020-03-01 MED ORDER — SODIUM CHLORIDE 0.9% FLUSH
10.0000 mL | INTRAVENOUS | Status: DC | PRN
  Administered 2020-03-01: 10 mL
  Filled 2020-03-01: qty 10

## 2020-03-01 MED ORDER — ACETAMINOPHEN 325 MG PO TABS
ORAL_TABLET | ORAL | Status: AC
  Filled 2020-03-01: qty 2

## 2020-03-01 MED ORDER — SODIUM CHLORIDE 0.9 % IV SOLN
Freq: Once | INTRAVENOUS | Status: AC
  Filled 2020-03-01: qty 250

## 2020-03-01 MED FILL — CAPECITABINE 500 MG TABLET: 500 | 21 days supply | Qty: 70 | Fill #0

## 2020-03-01 NOTE — Patient Instructions (Signed)

## 2020-03-01 NOTE — Patient Instructions (Signed)
[facility] Discharge Instructions for Patients Receiving Chemotherapy  Today you received the following chemotherapy agents: trastuzumab.   To help prevent nausea and vomiting after your treatment, we encourage you to take your nausea medication as directed.   If you develop nausea and vomiting that is not controlled by your nausea medication, call the clinic.   BELOW ARE SYMPTOMS THAT SHOULD BE REPORTED IMMEDIATELY:  *FEVER GREATER THAN 100.5 F  *CHILLS WITH OR WITHOUT FEVER  NAUSEA AND VOMITING THAT IS NOT CONTROLLED WITH YOUR NAUSEA MEDICATION  *UNUSUAL SHORTNESS OF BREATH  *UNUSUAL BRUISING OR BLEEDING  TENDERNESS IN MOUTH AND THROAT WITH OR WITHOUT PRESENCE OF ULCERS  *URINARY PROBLEMS  *BOWEL PROBLEMS  UNUSUAL RASH Items with * indicate a potential emergency and should be followed up as soon as possible.  Feel free to call the clinic should you have any questions or concerns. The clinic phone number is [phone].  Please show the [facility] at check-in to the Emergency Department and triage nurse.  Zoledronic Acid injection (Hypercalcemia, Oncology) What is this medicine? ZOLEDRONIC ACID (ZOE le dron ik AS id) lowers the amount of calcium loss from bone. It is used to treat too much calcium in your blood from cancer. It is also used to prevent complications of cancer that has spread to the bone. This medicine may be used for other purposes; ask your health care provider or pharmacist if you have questions. COMMON BRAND NAME(S): Zometa What should I tell my health care provider before I take this medicine? They need to know if you have any of these conditions:  aspirin-sensitive asthma  cancer, especially if you are receiving medicines used to treat cancer  dental disease or wear dentures  infection  kidney disease  receiving corticosteroids like dexamethasone or prednisone  an unusual or allergic reaction to zoledronic acid, other  medicines, foods, dyes, or preservatives  pregnant or trying to get pregnant  breast-feeding How should I use this medicine? This medicine is for infusion into a vein. It is given by a health care professional in a hospital or clinic setting. Talk to your pediatrician regarding the use of this medicine in children. Special care may be needed. Overdosage: If you think you have taken too much of this medicine contact a poison control center or emergency room at once. NOTE: This medicine is only for you. Do not share this medicine with others. What if I miss a dose? It is important not to miss your dose. Call your doctor or health care professional if you are unable to keep an appointment. What may interact with this medicine?  certain antibiotics given by injection  NSAIDs, medicines for pain and inflammation, like ibuprofen or naproxen  some diuretics like bumetanide, furosemide  teriparatide  thalidomide This list may not describe all possible interactions. Give your health care provider a list of all the medicines, herbs, non-prescription drugs, or dietary supplements you use. Also tell them if you smoke, drink alcohol, or use illegal drugs. Some items may interact with your medicine. What should I watch for while using this medicine? Visit your doctor or health care professional for regular checkups. It may be some time before you see the benefit from this medicine. Do not stop taking your medicine unless your doctor tells you to. Your doctor may order blood tests or other tests to see how you are doing. Women should inform their doctor if they wish to become pregnant or think they might be  pregnant. There is a potential for serious side effects to an unborn child. Talk to your health care professional or pharmacist for more information. You should make sure that you get enough calcium and vitamin D while you are taking this medicine. Discuss the foods you eat and the vitamins you take  with your health care professional. Some people who take this medicine have severe bone, joint, and/or muscle pain. This medicine may also increase your risk for jaw problems or a broken thigh bone. Tell your doctor right away if you have severe pain in your jaw, bones, joints, or muscles. Tell your doctor if you have any pain that does not go away or that gets worse. Tell your dentist and dental surgeon that you are taking this medicine. You should not have major dental surgery while on this medicine. See your dentist to have a dental exam and fix any dental problems before starting this medicine. Take good care of your teeth while on this medicine. Make sure you see your dentist for regular follow-up appointments. What side effects may I notice from receiving this medicine? Side effects that you should report to your doctor or health care professional as soon as possible:  allergic reactions like skin rash, itching or hives, swelling of the face, lips, or tongue  anxiety, confusion, or depression  breathing problems  changes in vision  eye pain  feeling faint or lightheaded, falls  jaw pain, especially after dental work  mouth sores  muscle cramps, stiffness, or weakness  redness, blistering, peeling or loosening of the skin, including inside the mouth  trouble passing urine or change in the amount of urine Side effects that usually do not require medical attention (report to your doctor or health care professional if they continue or are bothersome):  bone, joint, or muscle pain  constipation  diarrhea  fever  hair loss  irritation at site where injected  loss of appetite  nausea, vomiting  stomach upset  trouble sleeping  trouble swallowing  weak or tired This list may not describe all possible side effects. Call your doctor for medical advice about side effects. You may report side effects to FDA at 1-800-FDA-1088. Where should I keep my medicine? This drug  is given in a hospital or clinic and will not be stored at home. NOTE: This sheet is a summary. It may not cover all possible information. If you have questions about this medicine, talk to your doctor, pharmacist, or health care provider.  2020 [employer] Standard (2013-05-27 14:19:39)

## 2020-03-01 NOTE — Telephone Encounter (Signed)
Scheduled follow-up appointments per 2/18 los. Patient is aware.

## 2020-03-02 LAB — CANCER ANTIGEN 27.29: CA 27.29: 104.3 U/mL — ABNORMAL HIGH (ref 0.0–38.6)

## 2020-03-15 ENCOUNTER — Other Ambulatory Visit: Payer: Self-pay | Admitting: Hematology

## 2020-03-15 DIAGNOSIS — G4700 Insomnia, unspecified: Secondary | ICD-10-CM

## 2020-03-15 MED ORDER — ZOLPIDEM TARTRATE 10 MG PO TABS: 10.0000 mg | ORAL_TABLET | Freq: Every evening | ORAL | 2 refills | Status: DC | PRN

## 2020-03-20 NOTE — Progress Notes (Signed)
[name]   [phone] Fax:([phone]) [phone]   Clinic Follow up Note   Patient Care Team: [name], [name], [name] as PCP - General (Physician Assistant) [name], MD as Consulting Physician (Orthopedic Surgery)  Date of Service:  03/22/2020  CHIEF COMPLAINT:  f/u of metastatic breast cancer  SUMMARY OF ONCOLOGIC HISTORY: Oncology History Overview Note   Cancer Staging No matching staging information was found for the patient.     [name], right ([place])  11/1997 Cancer Diagnosis   Patient had 1.4 cm poorly differentiated right breast carcinoma diagnosed in Nov 1999 at age 44, 1/7 axillary nodes involved, ER/PR and HER 2 positive. She had mastectomy with the 7 axillary node evaluation, 4 cycles of adriamycin/ cytoxan followed by taxotere, then five years of tamoxifen thru June 2005 (no herceptin in 1999). She had bilateral oophorectomy in May 2005. She was briefly on aromatase inhibitor after tamoxifen, but discontinued this herself due to poor tolerance.   04/04/2015 Genetic Testing   Negative genetic testing on the breast/ovarian cancer pnael.  The Breast/Ovarian gene panel offered by [facility] includes sequencing and rearrangement analysis for the following 20 genes:  ATM, BARD1, BRCA1, BRCA2, BRIP1, CDH1, CHEK2, EPCAM, FANCC, MLH1, MSH2, MSH6, NBN, PALB2, PMS2, PTEN, RAD51C, RAD51D, TP53, and XRCC2.   06/26/2015 Pathology Results   Initial Path Report Bone, curettage, Left femur met, intramedullary subtroch tissue - METASTATIC ADENOCARCINOMA.  Estrogen Receptor: 95%, POSITIVE, STRONG STAINING INTENSITY Progesterone Receptor: 2%, POSITIVE, STRONG  HER2 (+), IHC 3+   06/26/2015 Surgery   Biopsy of metastatic tissue and stabilization with Affixus trochanteric nail, proximal and distal interlock of left femur by Dr. [name]    06/28/2015 Imaging   CT Chest w/ Contrast 1. Extensive right internal mammary chain  lymphadenopathy consistent with metastatic breast cancer. 2. Lytic metastases involving the posterior elements at T1 with probable nondisplaced pathologic fracture of the spinous process. 3. No other evidence of thoracic metastatic disease. 4. Trace bilateral pleural effusions with associated bibasilar atelectasis. 5. Indeterminate right thyroid nodule, not previously imaged. This could be evaluated with thyroid ultrasound as clinically warranted.   06/29/2015 -  Chemotherapy   Zometa started monthly on 06/29/15 and switched to q10month from 01/08/17   07/01/2015 Imaging   Bone Scan 1. Increased activity noted throughout the left femur. Although metastatic disease cannot be excluded, these changes are most likely secondary to prior surgery. 2. No other focal abnormalities identified to suggest metastatic disease.   07/12/2015 - 07/26/2015 Radiation Therapy   Left femur, 30 Gy in 10 fractions by Dr. [name]   07/14/2015 Initial Diagnosis   [name]   07/19/2015 Imaging   Initial PET Scan 1. Severe multifocal osseous metastatic disease, much greater than anticipated based on prior imaging. 2. Multifocal nodal metastases involving the right internal mammary, prevascular and subpectoral lymph nodes. No axillary pulmonary involvement identified. 3. No distant extra osseous metastases.   07/30/2015 - 11/29/2015 Chemotherapy   Docetaxel, Herceptin and Perjeta every 3 weeks X6 cycles, pt tolerated moderately well, restaging scan showed excellent response    09/18/2015 Imaging   CT Head w/wo Contrast 1. No acute intracranial abnormality or significant interval change. 2. Stable minimal periventricular white matter hypoattenuation on the right. This may reflect a remote ischemic injury. 3. No evidence for metastatic disease to the brain. 4. No focal soft tissue lesion to explain the patient's tenderness.   10/30/2015 Imaging  CT Abdomen Pelvis w/  Contrast 1. Few mildly prominent fluid-filled loops of small bowel scattered throughout the abdomen, with intraluminal fluid density within the distal colon. Given the provided history, findings are suggestive of acute enteritis/diarrheal illness. 2. No other acute intra-abdominal or pelvic process identified. 3. Widespread osseous metastatic disease, better evaluated on most recent PET-CT from 07/19/2015. No pathologic fracture or other complication. 4. 11 mm hypodensity within the left kidney. This lesion measures intermediate density, and is indeterminate. While this lesion is similar in size relative to recent studies, this is increased in size relative to prior study from 2012. Further evaluation with dedicated renal mass protocol CT and/or MRI is recommended for complete characterization.   12/2015 - 05/14/2016 Chemotherapy   Maintenance Herceptin and pejeta every 3 weeks stopped on 05/14/16 due to disease progression   12/25/2015 Imaging   Restaging PET Scan 1. Markedly improved skeletal activity, with only several faint foci of residual accentuated metabolic activity, but resolution of the vast majority of the previously extensive osseous metastatic disease. 2. Resolution of the prior right internal mammary and right prevascular lymph nodes. 3. Residual subcutaneous edema and edema along fascia planes in the left upper thigh. This remains somewhat more than I would expect for placement of an IM nail 6 weeks ago. If there is leg swelling further down, consider Doppler venous ultrasound to rule out left lower extremity DVT. I do not perceive an obvious difference in density between the pelvic and common femoral veins on the noncontrast CT data. 4. Chronic right maxillary and right sphenoid sinusitis.   01/16/2016 - 10/20/2017 Anti-estrogen oral therapy   Letrozole 2.5 mg daily. She was switched to exemestane due to disease progression on 02/08/17. Stopped on 10/20/2017 when treatment changed to chemo     03/01/2016 Miscellaneous   Patient presented to ED following a fall; she reports she landed on her back and is now experiencing back pain. The patient was evaluated and discharge home same day with pain control.   05/01/2016 Imaging   CT CAP IMPRESSION: 1. Stable exam.  No new or progressive disease identified. 2. No evidence for residual or recurrent adenopathy within the chest. 3. Stable bone metastasis.   05/01/2016 Imaging   BONE SCAN IMPRESSION: 1. Small focal uptake involving the anterior rib ends of the left second and right fourth ribs, new since the prior bone scan. The location of this uptake is more suggestive of a traumatic or inflammatory etiology as opposed to metastatic disease. No other evidence of metastatic disease. 2. There are other areas of stable uptake which are most likely degenerative/reactive, including right shoulder and cervical spine uptake and uptake along the right femur adjacent to the ORIF hardware.   05/20/2016 Imaging   NM PET Skull to Thigh IMPRESSION: 1. There multiple metastatic foci in the bony pelvis, including some new abnormal foci compare to the prior PET-CT, compatible with mildly progressive bony metastatic disease. Also, a left T11 vertebral hypermetabolic metastatic lesion is observed, new compared to the prior exam. 2. No active extraosseous malignancy is currently identified. 3. Right anterior fourth rib healing fracture, appears be   06/04/2016 - 01/08/2017 Chemotherapy   Second line chemotherapy Kadcyla every 3 weeks started on 06/04/16 and stopped on 01/08/17 due to mixed response.     10/05/2016 Imaging   CT CAP and Bone Scan:  Bones: left intramedullary rod and left femoral head neck screw in place. In the proximal diaphysis of the left femur there is cortical thickening and   irregularity. No lytic or blastic bone lesions. No fracture or vertebral endplate destruction.   No definite evidence of recurrent disease in the  chest, abdomen, or pelvis.   10/28/2016 Imaging   CT A/P IMPRESSION: No acute process demonstrated in the abdomen or pelvis. No evidence of bowel obstruction or inflammation.   11/04/2016 Imaging   DG foot complete left: IMPRESSION: No fracture or dislocation.  No soft tissue abnormality   11/23/2016 Imaging   CT RIGHT FEMUR IMPRESSION: Negative CT scan of the right thigh.  No visible metastatic disease.  CT LEFT FEMUR IMPRESSION: 1. Postsurgical changes related to prior cephalomedullary rod fixation of the left femur with linear lucency along the anterolateral proximal femoral cortex at level of the lesser trochanter, suspicious for nondisplaced fracture. 2. Slightly permeative appearance of the proximal left femoral cortex at the site of known prior osseous metastases is largely unchanged. 3. Unchanged patchy lucency and sclerosis along the anterior acetabulum, which appears to correspond with an area of faint uptake on the prior PET-CT, suspicious for osseous metastasis. These results will be called to the ordering clinician or representative by the Radiologist Assistant, and communication documented in the PACS or zVision Dashboard.     01/27/2017 Imaging   IMPRESSION: 1. Mixed response to osseous metastasis. Some hypermetabolic lesions are new and increasingly hypermetabolic, while another index lesion is decreasingly hypermetabolic. 2. No evidence of hypermetabolic soft tissue metastasis.    02/08/2017 - 10/19/2017 Antibody Plan   -Herceptin every 3 week since 02/08/17 -Oral Lanpantinib 1562m and decreased to 5045mdue to diarrhea and depression. Due to disease progression she swithced to Verzenio 15035mn 05/04/17. She did not toelrate well so she was switched to Ibrance 100m50m 05/31/17. Due to her neutropenia, Ibrance dose reduced to 75 mg daily, 3 weeks on, one-week off, stopped on 10/20/2017 due to disease progression.    04/30/2017 PET scan   IMPRESSION: 1.  Primarily increase in hypermetabolism of osseous metastasis. An isolated right acetabular lesion is decreased in hypermetabolism since the prior exam. 2. No evidence of hypermetabolic soft tissue metastasis. 3. Similar non FDG avid right-sided thyroid nodule, favoring a benign etiology. Recommend attention on follow-up.   08/30/2017 PET scan   08/30/2017 PET Scan  IMPRESSION: 1. Worsening osseous metastatic disease. 2. Focal hypermetabolism in the central spinal canal the L1 level, similar to the prior exam. Metastatic implant cannot be excluded. 3. Slight enlargement of a right thyroid nodule which now appears more cystic in character. Consider further evaluation with thyroid ultrasound. If patient is clinically hyperthyroid, consider nuclear medicine thyroid uptake and scan   08/30/2017 Progression   08/30/2017 PET Scan  IMPRESSION: 1. Worsening osseous metastatic disease. 2. Focal hypermetabolism in the central spinal canal the L1 level, similar to the prior exam. Metastatic implant cannot be excluded. 3. Slight enlargement of a right thyroid nodule which now appears more cystic in character. Consider further evaluation with thyroid ultrasound. If patient is clinically hyperthyroid, consider nuclear medicine thyroid uptake and scan   09/29/2017 - 10/11/2017 Radiation Therapy   Pt received 27 Gy in 9 fractions directed at the areas of significant uptake noted on recent bone scan the right pelvis and right proximal femur, with Dr, KinaSondra Come10/09/2017 - 04/07/2018 Chemotherapy   -Herceptin every 3 weeks starting 02/08/17, perjeta added on 10/20/2017  -weekly Taxol started on 10/21/2017. Will reduce Taxol to 2 weeks on, 1 week off starting on 02/04/17.  Stoped due to mild disease progression  10/27/2017 Imaging   10/27/2017 CT AP IMPRESSION: 1. No acute abnormality. No evidence of bowel obstruction or acute bowel inflammation. Normal appendix. 2. Stable patchy sclerotic osseous  metastases throughout the visualized skeleton. No new or progressive metastatic disease in the abdomen or pelvis.   01/11/2018 Imaging   Whole Body Bone Scan 01/11/18  IMPRESSION: 1. Multifocal osseous metastases as described. 2. Right scapular metastasis. 3. Right superior thoracic spinous process. 4. Right sacral ala and right L5 metastases. 5. Right acetabular metastasis. 6. Right proximal femur metastasis. 7. Diffuse metastases throughout the left femur. 8. Anterior right fourth rib metastasis.   01/11/2018 Imaging   CT CAP W Contrast 01/11/18  IMPRESSION: 1. Similar-appearing osseous metastatic disease. 2. No evidence for progressed metastatic disease in the chest, abdomen or pelvis.   03/30/2018 Imaging   Bone Scan 03/30/18  IMPRESSION: Stable multifocal bony metastatic disease.   03/30/2018 Imaging    CT CAP W Contrast 03/30/18 IMPRESSION: 1. New mild contiguous wall thickening with associated mucosal hyperenhancement throughout the left colon and rectum, suggesting nonspecific infectious or inflammatory proctocolitis. Differential includes C diff colitis. 2. Stable faint patchy sclerosis in the iliac bones and lumbar vertebral bodies. No new or progressive osseous metastatic disease by CT. Please note that the osseous lesions are relatively occult by CT and would be better evaluated by PET-CT, as clinically warranted. 3. No evidence of extra osseous metastatic disease.  These results will be called to the ordering clinician or representative by the Radiologist Assistant, and communication documented in the PACS or zVision Dashboard.   04/15/2018 - 06/16/2018 Chemotherapy   Enhertu q3weeks starting 04/15/18. Stopped Enhertu after cycle 3 on 05/27/18 due to poor toleration due to poor toleration with anorexia, weight loss, N&V, diarrhea   04/21/2018 PET scan   PET 04/21/18  IMPRESSION: 1. Overall marked interval improvement in bony hypermetabolic disease. All of the  previously identified hypermetabolic osseous metastases have decreased in hypermetabolism in the interval. There is a new tiny focus of hypermetabolism in the right aspect of the L3 vertebral body that appears to correspond to a new 8 mm lucency, raising concern for new metastatic disease. Close attention on follow-up recommended. 2. Focal hypermetabolism in the central spinal canal at the level of L1 previously has resolved. 3. No hypermetabolic soft tissue disease on today's study. 4. Right thyroid nodule seen previously has decreased in the interval.    06/16/2018 Imaging   CT LUMBAR SPINE WO CONTRAST 06/16/18 IMPRESSION: 1. Transitional lumbosacral anatomy. Correlation with radiographs is recommended prior to any operative intervention. 2. Suspicion of a right L3 vertebral body metastasis on the April PET-CT is not evident by CT. Stable degenerative versus metastatic lesion at the right L5 superior endplate. Small but increased right iliac bone metastasis since March. Lumbar MRI would best characterize lumbar metastatic disease (without contrast if necessary). 3. Suspect L2-L3 left subarticular segment disc herniation. Query left L2 radiculitis. 4. Stable appearance of rightward L4-L5 disc degeneration which could affect the right foramen and right lateral recess.   06/17/2018 - 11/16/2018 Chemotherapy   Restart Taxol 2 weeks on/1 week off with Herceptin and Perjeta q3weeks on 06/17/18. Perjeta was stopped after C5 due to diarrhea. Stopped Taxol after 11/16/18 due to disease progression to L3.    07/06/2018 PET scan   PET  IMPRESSION: 1. Near total resolution of hypermetabolic activity associated with skeletal metastasis. Very mild activity associated with the LEFT iliac bone which is similar to background blood pool activity. 2. No new   or progressive disease.   11/21/2018 PET scan   IMPRESSION: 1. New focal hypermetabolic lesion eccentric to the right in the L3 vertebral body,  maximum SUV 6.1 compatible with a new focus of active malignancy. 2. Very low-grade activity similar to blood pool in previous existing mildly sclerotic metastatic lesions including the left iliac bone lesion. Some of the mildly sclerotic lesions are not hypermetabolic compatible with effectively treated bony metastatic disease. 3. No hypermetabolic soft tissue lesions are identified.     11/30/2018 - 08/25/2019 Chemotherapy   Continue Herceptin q3weeks. Stopped 08/25/19 due to significant disease progression and switched to  IV Matuzumab   12/12/2018 - 12/17/2018 Chemotherapy   Tucatinib (TUKYSA) 6 tabs BID as a next line therapy starting 12/12/18. Stopped 12/17/18 duet o N&V, poor appetite and HA    12/21/2018 - 03/17/2019 Chemotherapy   IV Eribulin on day 1, 8 every 21 days starting 12/21/18. Stopped 03/17/19 due to mixed response to treatment.    03/21/2019 PET scan   IMPRESSION: 1. Mixed response to therapy of osseous metastasis. Although a right-sided L4 lesion demonstrates decreased hypermetabolism today, new left iliac and right ischial hypermetabolic lesions are seen. 2. No evidence of hypermetabolic soft tissue metastasis.     03/27/2019 - 07/21/2019 Chemotherapy   Neratinib (Nerlynx) titrate up from 138m to 2482mstarting 03/27/19. She could not tolerate 16041mnd stopped on 02/05/19 due to N&V and diarrhea. Will restart at 120m16m 04/10/18-04/27/19 and 4/19-4/22. Reduced to 80mg21me daily on 05/10/19. Held/stopped since 07/21/19 due to dry cough, Nausea and body aches.   06/23/2019 Imaging   CT Head  IMPRESSION: 1. No acute intracranial abnormality or metastatic disease identified. 2. Chronic white matter disease in the right hemisphere is stable from prior exams.     09/05/2019 PET scan   IMPRESSION: 1. Significant progression of widespread hypermetabolic osseous metastases throughout the axial skeleton as detailed. 2. No hypermetabolic extraosseous sites of metastatic disease.    09/15/2019 - 12/22/2019 Chemotherapy   -IV Margetuximab q3weeks and IV Gemcitabine 2 weeks on/1 week off starting 09/15/19. Carboplatin added to Gemcitabine starting with C3 on 11/03/19. D/c after C5 due to disease progression in bone.    12/13/2019 Imaging   PET  IMPRESSION: 1. Interval progression of widespread, FDG avid osseous metastasis involving the axial skeleton. 2. No FDG avid extra osseous sites of metastatic disease.     01/19/2020 -  Chemotherapy   -Xeloda BID 2 weeks on/1 week off and Herceptin q3weeks starting 01/19/20. C1 at 1000mg 66m C2 increased to 1500mg i68me AM and 1000mg in58m PM starting 02/12/20.      CURRENT THERAPY:  -Zometa q3months 80month BID 2 weeks on/1 week off and Herceptinq3weeks starting1/7/22.C1 at 1000mg BID.4mncreased to 1500mg in th21m and 1000mg in the65mtarting 1[place] James[place] HISTORY:  [name] a follow up. She presents to the clinic with her interpretor. She notes she is doing well. She notes she was sick from her supplement with nausea so she stopped it. She notes her chemo is going well. She notes 1 dose she accidentally took 2000mg but wen30mck to her regular doses. She did not get sick from the increased dose. She notes her pain has improved, but has headaches some times. She notes she takes Oxycodone as needed, not everyday. She notes she takes potassium intermittently, but when she takes it, it will be twice daily.     REVIEW OF SYSTEMS:  Constitutional: Denies fevers, chills or abnormal weight loss Eyes: Denies blurriness of vision Ears, nose, mouth, throat, and face: Denies mucositis or sore throat Respiratory: Denies cough, dyspnea or wheezes Cardiovascular: Denies palpitation, chest discomfort or lower extremity swelling Gastrointestinal:  Denies nausea, heartburn or change in bowel habits Skin: Denies abnormal skin rashes Lymphatics: Denies new lymphadenopathy or easy bruising Neurological:Denies  numbness, tingling or new weaknesses Behavioral/Psych: Mood is stable, no new changes  All other systems were reviewed with the patient and are negative.  MEDICAL HISTORY:  Past Medical History:  Diagnosis Date  . Abnormal Pap smear   . Anxiety   . Breast cancer ([place])   . Cancer ([place]) 1999   breast-s/p mastectomy, chemo, rad  . CIN I (cervical intraepithelial neoplasia I) 2003   by colpo  . Congenital deafness   . Deaf   . Depression   . Port-A-Cath in place 2017   for chemotherapy  . Radiation 07/12/15-07/26/15   left femur 30 Gy  . S/P bilateral oophorectomy     SURGICAL HISTORY: Past Surgical History:  Procedure Laterality Date  . ABDOMINAL HYSTERECTOMY    . INTRAMEDULLARY (IM) NAIL INTERTROCHANTERIC Left 06/26/2015   Procedure: LEFT BIOMET LONG AFFIXS NAIL;  Surgeon: [name], MD;  Location: [facility];  Service: Orthopedics;  Laterality: Left;  . IR GENERIC HISTORICAL  08/06/2015   IR US GUIDE VASC ACCESS LEFT 08/06/2015 Aletta Edouard, MD [facility]  . IR GENERIC HISTORICAL  08/06/2015   IR FLUORO GUIDE CV LINE LEFT 08/06/2015 Aletta Edouard, MD [facility]  . IR GENERIC HISTORICAL  03/05/2016   IR CV LINE INJECTION 03/05/2016 Markus Daft, MD [facility]  . LEFT HEART CATH AND CORONARY ANGIOGRAPHY N/A 12/13/2017   Procedure: LEFT HEART CATH AND CORONARY ANGIOGRAPHY;  Surgeon: Leonie Man, MD;  Location: [facility];  Service: Cardiovascular;  Laterality: N/A;  . MASTECTOMY     right breast  . MASTECTOMY    . OVARIAN CYST REMOVAL    . RADIOLOGY WITH ANESTHESIA Left 06/25/2015   Procedure: MRI OF LEFT HIP WITH OR WITHOUT CONTRAST;  Surgeon: Medication Radiologist, MD;  Location: [facility];  Service: Radiology;  Laterality: Left;  DR. MCINTYRE/MRI  . TUBAL LIGATION      I have reviewed the social history and family history with the patient and they are unchanged from previous note.  ALLERGIES:  is allergic to promethazine hcl, chlorhexidine, and diphenhydramine  hcl.  MEDICATIONS:  Current Outpatient Medications  Medication Sig Dispense Refill  . ALPRAZolam (XANAX) 0.25 MG tablet Take 1 tablet (0.25 mg total) by mouth 2 (two) times daily as needed for anxiety. 30 tablet 0  . capecitabine (XELODA) 500 MG tablet Take 3 tabs in morning, and 2 tabs in evening. Take for 14 days on, 7 days off. Repeat every 21 days. Start on 03/04/2020. 70 tablet 1  . diphenoxylate-atropine (LOMOTIL) 2.5-0.025 MG tablet Take 1-2 tablets by mouth 4 (four) times daily as needed for diarrhea or loose stools. 60 tablet 1  . lidocaine-prilocaine (EMLA) cream Apply 1 application topically as needed. Apply to Porta-Cath 1-2 hours prior to access as directed. 90 g 2  . ondansetron (ZOFRAN) 8 MG tablet Take 1 tablet (8 mg total) by mouth every 8 (eight) hours as needed for nausea or vomiting. 45 tablet 1  . oxyCODONE (ROXICODONE) 5 MG immediate release tablet Take 1 tablet (5 mg total) by mouth every 8 (eight) hours as needed for severe pain. Refill on  02/17/2020 90 tablet 0  . potassium chloride (KLOR-CON) 10 MEQ tablet Take 3 tablets (30 mEq total) by mouth daily. 90 tablet 2  . prochlorperazine (COMPAZINE) 10 MG tablet Take 1 tablet (10 mg total) by mouth 2 (two) times daily as needed for nausea or vomiting. 20 tablet 1  . zolpidem (AMBIEN) 10 MG tablet Take 1 tablet (10 mg total) by mouth at bedtime as needed for sleep. 15 tablet 2   No current facility-administered medications for this visit.   Facility-Administered Medications Ordered in Other Visits  Medication Dose Route Frequency Provider Last Rate Last Admin  . heparin lock flush 100 unit/mL  500 Units Intracatheter Once PRN Truitt Merle, MD      . potassium chloride SA (K-DUR) CR tablet 40 mEq  40 mEq Oral Once Truitt Merle, MD      . sodium chloride 0.9 % 1,000 mL with potassium chloride 10 mEq infusion   Intravenous Continuous Gordy Levan, MD   Stopped at 09/10/15 1653  . sodium chloride 0.9 % injection 10 mL  10 mL  Intravenous PRN Livesay, Lennis P, MD      . sodium chloride flush (NS) 0.9 % injection 10 mL  10 mL Intracatheter PRN Truitt Merle, MD      . trastuzumab-dkst (OGIVRI) 357 mg in sodium chloride 0.9 % 250 mL chemo infusion  6 mg/kg (Treatment Plan Recorded) Intravenous Once Truitt Merle, MD        PHYSICAL EXAMINATION: ECOG PERFORMANCE STATUS: 2 - Symptomatic, <50% confined to bed  Vitals:   03/22/20 0915  BP: 125/78  Pulse: 75  Resp: 17  Temp: 97.6 F (36.4 C)  SpO2: 99%   Filed Weights   03/22/20 0915  Weight: 135 lb 11.2 oz (61.6 kg)    GENERAL:alert, no distress and comfortable SKIN: skin color, texture, turgor are normal, no rashes or significant lesions EYES: normal, Conjunctiva are pink and non-injected, sclera clear  NECK: supple, thyroid normal size, non-tender, without nodularity LYMPH:  no palpable lymphadenopathy in the cervical, axillary  LUNGS: clear to auscultation and percussion with normal breathing effort HEART: regular rate & rhythm and no murmurs and no lower extremity edema ABDOMEN:abdomen soft, non-tender and normal bowel sounds Musculoskeletal:no cyanosis of digits and no clubbing  NEURO: alert & oriented x 3 with fluent speech, no focal motor/sensory deficits  LABORATORY DATA:  I have reviewed the data as listed CBC Latest Ref Rng & Units 03/22/2020 03/01/2020 02/12/2020  WBC 4.0 - 10.5 K/uL 3.5(L) 3.7(L) 5.1  Hemoglobin 12.0 - 15.0 g/dL 10.3(L) 10.7(L) 10.5(L)  Hematocrit 36.0 - 46.0 % 29.2(L) 31.2(L) 31.4(L)  Platelets 150 - 400 K/uL 166 156 149(L)     CMP Latest Ref Rng & Units 03/22/2020 03/01/2020 02/12/2020  Glucose 70 - 99 mg/dL 83 91 88  BUN 6 - 20 mg/dL _0 Creatinine 0.44 - 1.00 mg/dL 0.89 0.91 0.86  Sodium 135 - 145 mmol/L 140 139 141  Potassium 3.5 - 5.1 mmol/L 3.0(L) 3.1(L) 3.0(L)  Chloride 98 - 111 mmol/L 106 108 108  CO2 22 - 32 mmol/L _1 Calcium 8.9 - 10.3 mg/dL 8.7(L) 9.1 8.8(L)  Total Protein 6.5 - 8.1 g/dL 6.4(L) 6.7 6.5   Total Bilirubin 0.3 - 1.2 mg/dL 3.2(H) 2.6(H) 1.6(H)  Alkaline Phos 38 - 126 U/L 81 86 85  AST 15 - 41 U/L _2 ALT 0 - 44 U/L _3 RADIOGRAPHIC STUDIES:  I have personally reviewed the radiological images as listed and agreed with the findings in the report. No results found.   ASSESSMENT & PLAN:  [name] is a 55 y.o. 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 She had right mastectomy, 4 cycles of AC-T and 5 years of Tamoxifen/AI.  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 -After her first line ofchemotreatment and initial maintenance therapy, she progressed or had poor toleration of multiple lines of treatment since then. -Based on 12/13/19 PET, she progressed diffusely in the bone on7th linetherapywith anti-HER2IV Margetuximabq3weekswithIV Chemo weeklyGemcitabine(Carboplatin added C3)2 weeks on/1 week offafter 5 cycles,09/15/19-12/22/19. -Ipreviouslydiscussed the longer she is on treatment the less treatment options and efficacy she will have. -I started her onXeloda 1026m BID 2 weeks on/1 week off for first cycle on 01/19/20. If tolerating well will increase.Continue Herceptin q3weeks.Increased to 15032min the AM and100032m the PM since C2.  -S/p C3 she tolerated well without major side effects.  -Labs reviewed and stable. Overall adequate to proceed with Herceptin today.  -due to her slightly worsening hyperbilirubinemia, as will hold Xeloda until further evaluation. -Continue Herceptin every 3 weeks and Zometa q3mo45monthext in 04/2020).  -Plan to scan her again in early May with PET or CT CAP/bone scan if PET is denied by insurance, or sooner if needed  -F/u in 3 weeks.  2.Body pain, secondary to bone mets, Hypocalcemia, leg cramps  -She is being treated with Zometa q3mon68monthhe will  continue oral calcium and Vit Dand Prilosec for Acid Reflux. -Current pain includes, lower back, upper legs, hips and rightshoulder.PET12/3/21does indicate disease progression in her spine, pelvis and right scapula.She also has headaches occasionally.  -Pain has been improving lately. She uses 5mg O81modoneas needed, not daily.   3.Congenital deafness  4.Mild anemia, secondary to chemotherapy and malignancy -Moderate andStable   5.Hypokalemia -She developed diarrhea s/p cycle 3 Perjetaand again with Nerlynx, Both have been D/c. -She is onOral potassium 10meq255mabs daily. I encouraged her to increase potassium in diet.   6.Goal of care discussion, DNR/DNI -Wepreviouslyreviewed goal of care andI recommend DNR/DNI, shepreviouslyagreed.  -I encouraged her to start setting up her living will.We previously discussed hospice care if she has no more treatment available or could not tolerate more treatment  7.Insomnia -She cannot take Benadryl due to allergy reaction. She has tried trazodone with no relief.  -She is on Xanax and on Ambien10mgat 79m tabletsnightly. She knows not to take both at the same time. If she needs Oxycodone at night, she can use half tablet only.  -Stable.  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 Continue monitoring. Stable.   9. Mildhyperbilirubinemia  -likely second to chemotherapy, fluctuates  -Detorsed today, total bilirubin 3.2, will get reticulate count, and get abdominal ultrasound to rule out biliary obstruction   PLAN: -I refilled Xeloda today, will hold for now until US done [place]abs reviewed,will proceed with Herceptin today. -Continue Herceptin every 3 weeks.  -Continue Zometa every 3 months, next in 04/2020. -Abdominal ultrasound today or next Tuesday, I will call her after US and dKoreaided what to do next    No problem-specific Assessment & Plan notes found for this encounter.   Orders Placed This Encounter   Procedures  . NM PET Image Restag (PS) Skull Base To Thigh    Standing Status:   Future    Standing  Expiration Date:   03/22/2021    Order Specific Question:   If indicated for the ordered procedure, I authorize the administration of a radiopharmaceutical per Radiology protocol    Answer:   Yes    Order Specific Question:   Is the patient pregnant?    Answer:   No    Order Specific Question:   Preferred imaging location?    Answer:   [facility]  . US Abdomen Complete    Standing Status:   Future    Standing Expiration Date:   03/22/2021    Order Specific Question:   Reason for Exam (SYMPTOM  OR DIAGNOSIS REQUIRED)    Answer:   hyperbilirubinemia    Order Specific Question:   Preferred imaging location?    Answer:   [facility] to [facility] this morning    Standing Status:   Future    Standing Expiration Date:   03/22/2021   All questions were answered. The patient knows to call the clinic with any problems, questions or concerns. No barriers to learning was detected. The total time spent in the appointment was 30 minutes.     Truitt Merle, MD 03/22/2020   I, Joslyn Devon, am acting as scribe for Truitt Merle, MD.   I have reviewed the above documentation for accuracy and completeness, and I agree with the above.

## 2020-03-22 ENCOUNTER — Other Ambulatory Visit: Payer: Self-pay

## 2020-03-22 ENCOUNTER — Inpatient Hospital Stay (HOSPITAL_BASED_OUTPATIENT_CLINIC_OR_DEPARTMENT_OTHER): Payer: Medicaid Other | Admitting: Hematology

## 2020-03-22 ENCOUNTER — Inpatient Hospital Stay: Payer: Medicaid Other | Attending: Hematology

## 2020-03-22 ENCOUNTER — Inpatient Hospital Stay: Payer: Medicaid Other

## 2020-03-22 ENCOUNTER — Ambulatory Visit: Payer: Medicaid Other | Admitting: Hematology

## 2020-03-22 ENCOUNTER — Encounter: Payer: Self-pay | Admitting: Hematology

## 2020-03-22 ENCOUNTER — Ambulatory Visit (HOSPITAL_COMMUNITY): Payer: Medicaid Other

## 2020-03-22 VITALS — BP 125/78 | HR 75 | Temp 97.6°F | Resp 17 | Wt 135.7 lb

## 2020-03-22 DIAGNOSIS — C50911 Malignant neoplasm of unspecified site of right female breast: Secondary | ICD-10-CM

## 2020-03-22 DIAGNOSIS — Z5112 Encounter for antineoplastic immunotherapy: Secondary | ICD-10-CM | POA: Insufficient documentation

## 2020-03-22 DIAGNOSIS — E876 Hypokalemia: Secondary | ICD-10-CM | POA: Insufficient documentation

## 2020-03-22 DIAGNOSIS — Z452 Encounter for adjustment and management of vascular access device: Secondary | ICD-10-CM | POA: Diagnosis not present

## 2020-03-22 DIAGNOSIS — C7951 Secondary malignant neoplasm of bone: Secondary | ICD-10-CM | POA: Insufficient documentation

## 2020-03-22 DIAGNOSIS — D6481 Anemia due to antineoplastic chemotherapy: Secondary | ICD-10-CM | POA: Diagnosis not present

## 2020-03-22 DIAGNOSIS — Z95828 Presence of other vascular implants and grafts: Secondary | ICD-10-CM

## 2020-03-22 DIAGNOSIS — Z170 Estrogen receptor positive status [ER+]: Secondary | ICD-10-CM | POA: Diagnosis not present

## 2020-03-22 DIAGNOSIS — G620 Drug-induced polyneuropathy: Secondary | ICD-10-CM | POA: Diagnosis not present

## 2020-03-22 LAB — CBC WITH DIFFERENTIAL (CANCER CENTER ONLY)
Abs Immature Granulocytes: 0.01 10*3/uL (ref 0.00–0.07)
Basophils Absolute: 0 10*3/uL (ref 0.0–0.1)
Basophils Relative: 0 %
Eosinophils Absolute: 0.1 10*3/uL (ref 0.0–0.5)
Eosinophils Relative: 2 %
HCT: 29.2 % — ABNORMAL LOW (ref 36.0–46.0)
Hemoglobin: 10.3 g/dL — ABNORMAL LOW (ref 12.0–15.0)
Immature Granulocytes: 0 %
Lymphocytes Relative: 31 %
Lymphs Abs: 1.1 10*3/uL (ref 0.7–4.0)
MCH: 33.8 pg (ref 26.0–34.0)
MCHC: 35.3 g/dL (ref 30.0–36.0)
MCV: 95.7 fL (ref 80.0–100.0)
Monocytes Absolute: 0.4 10*3/uL (ref 0.1–1.0)
Monocytes Relative: 10 %
Neutro Abs: 2 10*3/uL (ref 1.7–7.7)
Neutrophils Relative %: 57 %
Platelet Count: 166 10*3/uL (ref 150–400)
RBC: 3.05 MIL/uL — ABNORMAL LOW (ref 3.87–5.11)
RDW: 16.5 % — ABNORMAL HIGH (ref 11.5–15.5)
WBC Count: 3.5 10*3/uL — ABNORMAL LOW (ref 4.0–10.5)
nRBC: 0 % (ref 0.0–0.2)

## 2020-03-22 LAB — CMP (CANCER CENTER ONLY)
ALT: 23 U/L (ref 0–44)
AST: 22 U/L (ref 15–41)
Albumin: 3.9 g/dL (ref 3.5–5.0)
Alkaline Phosphatase: 81 U/L (ref 38–126)
Anion gap: 8 (ref 5–15)
BUN: 11 mg/dL (ref 6–20)
CO2: 26 mmol/L (ref 22–32)
Calcium: 8.7 mg/dL — ABNORMAL LOW (ref 8.9–10.3)
Chloride: 106 mmol/L (ref 98–111)
Creatinine: 0.89 mg/dL (ref 0.44–1.00)
GFR, Estimated: >60 mL/min (ref >60)
Glucose, Bld: 83 mg/dL (ref 70–99)
Potassium: 3 mmol/L — ABNORMAL LOW (ref 3.5–5.1)
Sodium: 140 mmol/L (ref 135–145)
Total Bilirubin: 3.2 mg/dL — ABNORMAL HIGH (ref 0.3–1.2)
Total Protein: 6.4 g/dL — ABNORMAL LOW (ref 6.5–8.1)

## 2020-03-22 MED ORDER — SODIUM CHLORIDE 0.9% FLUSH
10.0000 mL | INTRAVENOUS | Status: DC | PRN
  Administered 2020-03-22: 10 mL
  Filled 2020-03-22: qty 10

## 2020-03-22 MED ORDER — LORATADINE 10 MG PO TABS
10.0000 mg | ORAL_TABLET | Freq: Once | ORAL | Status: AC
  Administered 2020-03-22: 10 mg via ORAL

## 2020-03-22 MED ORDER — LORATADINE 10 MG PO TABS
ORAL_TABLET | ORAL | Status: AC
  Filled 2020-03-22: qty 1

## 2020-03-22 MED ORDER — ACETAMINOPHEN 325 MG PO TABS
ORAL_TABLET | ORAL | Status: AC
  Filled 2020-03-22: qty 2

## 2020-03-22 MED ORDER — ACETAMINOPHEN 325 MG PO TABS
650.0000 mg | ORAL_TABLET | Freq: Once | ORAL | Status: AC
  Administered 2020-03-22: 650 mg via ORAL

## 2020-03-22 MED ORDER — CAPECITABINE 500 MG PO TABS: ORAL_TABLET | ORAL | 1 refills | Status: DC

## 2020-03-22 MED ORDER — HEPARIN SOD (PORK) LOCK FLUSH 100 UNIT/ML IV SOLN
500.0000 [IU] | Freq: Once | INTRAVENOUS | Status: AC | PRN
Start: 2020-03-22 — End: 2020-03-22
  Administered 2020-03-22: 500 [IU]
  Filled 2020-03-22: qty 5

## 2020-03-22 MED ORDER — SODIUM CHLORIDE 0.9 % IV SOLN
6.0000 mg/kg | Freq: Once | INTRAVENOUS | Status: AC
  Administered 2020-03-22: 357 mg via INTRAVENOUS
  Filled 2020-03-22: qty 17

## 2020-03-22 MED ORDER — SODIUM CHLORIDE 0.9% FLUSH
10.0000 mL | Freq: Once | INTRAVENOUS | Status: AC
  Administered 2020-03-22: 10 mL
  Filled 2020-03-22: qty 10

## 2020-03-22 MED ORDER — SODIUM CHLORIDE 0.9 % IV SOLN
Freq: Once | INTRAVENOUS | Status: AC
  Filled 2020-03-22: qty 250

## 2020-03-22 NOTE — Progress Notes (Signed)
Per Dr. Burr Medico, okay for patient to receive treatment today with bilirubin 3.2.

## 2020-03-22 NOTE — Patient Instructions (Signed)
[facility] Discharge Instructions for Patients Receiving Chemotherapy  Today you received the following chemotherapy agents: trastuzumab.   To help prevent nausea and vomiting after your treatment, we encourage you to take your nausea medication as directed.   If you develop nausea and vomiting that is not controlled by your nausea medication, call the clinic.   BELOW ARE SYMPTOMS THAT SHOULD BE REPORTED IMMEDIATELY:  *FEVER GREATER THAN 100.5 F  *CHILLS WITH OR WITHOUT FEVER  NAUSEA AND VOMITING THAT IS NOT CONTROLLED WITH YOUR NAUSEA MEDICATION  *UNUSUAL SHORTNESS OF BREATH  *UNUSUAL BRUISING OR BLEEDING  TENDERNESS IN MOUTH AND THROAT WITH OR WITHOUT PRESENCE OF ULCERS  *URINARY PROBLEMS  *BOWEL PROBLEMS  UNUSUAL RASH Items with * indicate a potential emergency and should be followed up as soon as possible.  Feel free to call the clinic should you have any questions or concerns. The clinic phone number is [phone].  Please show the [place] at check-in to the Emergency Department and triage nurse.  Zoledronic Acid injection (Hypercalcemia, Oncology) What is this medicine? ZOLEDRONIC ACID (ZOE le dron ik AS id) lowers the amount of calcium loss from bone. It is used to treat too much calcium in your blood from cancer. It is also used to prevent complications of cancer that has spread to the bone. This medicine may be used for other purposes; ask your health care provider or pharmacist if you have questions. COMMON BRAND NAME(S): Zometa What should I tell my health care provider before I take this medicine? They need to know if you have any of these conditions:  aspirin-sensitive asthma  cancer, especially if you are receiving medicines used to treat cancer  dental disease or wear dentures  infection  kidney disease  receiving corticosteroids like dexamethasone or prednisone  an unusual or allergic reaction to zoledronic acid, other  medicines, foods, dyes, or preservatives  pregnant or trying to get pregnant  breast-feeding How should I use this medicine? This medicine is for infusion into a vein. It is given by a health care professional in a hospital or clinic setting. Talk to your pediatrician regarding the use of this medicine in children. Special care may be needed. Overdosage: If you think you have taken too much of this medicine contact a poison control center or emergency room at once. NOTE: This medicine is only for you. Do not share this medicine with others. What if I miss a dose? It is important not to miss your dose. Call your doctor or health care professional if you are unable to keep an appointment. What may interact with this medicine?  certain antibiotics given by injection  NSAIDs, medicines for pain and inflammation, like ibuprofen or naproxen  some diuretics like bumetanide, furosemide  teriparatide  thalidomide This list may not describe all possible interactions. Give your health care provider a list of all the medicines, herbs, non-prescription drugs, or dietary supplements you use. Also tell them if you smoke, drink alcohol, or use illegal drugs. Some items may interact with your medicine. What should I watch for while using this medicine? Visit your doctor or health care professional for regular checkups. It may be some time before you see the benefit from this medicine. Do not stop taking your medicine unless your doctor tells you to. Your doctor may order blood tests or other tests to see how you are doing. Women should inform their doctor if they wish to become pregnant or think they might be  pregnant. There is a potential for serious side effects to an unborn child. Talk to your health care professional or pharmacist for more information. You should make sure that you get enough calcium and vitamin D while you are taking this medicine. Discuss the foods you eat and the vitamins you take  with your health care professional. Some people who take this medicine have severe bone, joint, and/or muscle pain. This medicine may also increase your risk for jaw problems or a broken thigh bone. Tell your doctor right away if you have severe pain in your jaw, bones, joints, or muscles. Tell your doctor if you have any pain that does not go away or that gets worse. Tell your dentist and dental surgeon that you are taking this medicine. You should not have major dental surgery while on this medicine. See your dentist to have a dental exam and fix any dental problems before starting this medicine. Take good care of your teeth while on this medicine. Make sure you see your dentist for regular follow-up appointments. What side effects may I notice from receiving this medicine? Side effects that you should report to your doctor or health care professional as soon as possible:  allergic reactions like skin rash, itching or hives, swelling of the face, lips, or tongue  anxiety, confusion, or depression  breathing problems  changes in vision  eye pain  feeling faint or lightheaded, falls  jaw pain, especially after dental work  mouth sores  muscle cramps, stiffness, or weakness  redness, blistering, peeling or loosening of the skin, including inside the mouth  trouble passing urine or change in the amount of urine Side effects that usually do not require medical attention (report to your doctor or health care professional if they continue or are bothersome):  bone, joint, or muscle pain  constipation  diarrhea  fever  hair loss  irritation at site where injected  loss of appetite  nausea, vomiting  stomach upset  trouble sleeping  trouble swallowing  weak or tired This list may not describe all possible side effects. Call your doctor for medical advice about side effects. You may report side effects to FDA at 1-800-FDA-1088. Where should I keep my medicine? This drug  is given in a hospital or clinic and will not be stored at home. NOTE: This sheet is a summary. It may not cover all possible information. If you have questions about this medicine, talk to your doctor, pharmacist, or health care provider.  2020 [employer]/Gold Standard (2013-05-27 14:19:39)

## 2020-03-22 NOTE — Patient Instructions (Signed)

## 2020-03-23 LAB — CANCER ANTIGEN 27.29: CA 27.29: 84.4 U/mL — ABNORMAL HIGH (ref 0.0–38.6)

## 2020-03-25 ENCOUNTER — Telehealth: Payer: Self-pay | Admitting: Hematology

## 2020-03-25 NOTE — Telephone Encounter (Signed)
Scheduled follow-up appointment per 3/11 los. Mailed calendar.

## 2020-03-26 ENCOUNTER — Other Ambulatory Visit: Payer: Self-pay

## 2020-03-26 ENCOUNTER — Ambulatory Visit (HOSPITAL_COMMUNITY)
Admission: RE | Admit: 2020-03-26 | Discharge: 2020-03-26 | Disposition: A | Discharge status: Home / Self Care | Payer: Medicaid Other | Source: Ambulatory Visit | Attending: Hematology | Admitting: Hematology

## 2020-03-26 DIAGNOSIS — C50911 Malignant neoplasm of unspecified site of right female breast: Secondary | ICD-10-CM | POA: Diagnosis not present

## 2020-03-26 DIAGNOSIS — K760 Fatty (change of) liver, not elsewhere classified: Secondary | ICD-10-CM | POA: Diagnosis not present

## 2020-03-26 DIAGNOSIS — C7951 Secondary malignant neoplasm of bone: Secondary | ICD-10-CM | POA: Insufficient documentation

## 2020-04-01 ENCOUNTER — Telehealth: Payer: Self-pay

## 2020-04-01 ENCOUNTER — Other Ambulatory Visit: Payer: Self-pay

## 2020-04-01 ENCOUNTER — Inpatient Hospital Stay: Payer: Medicaid Other

## 2020-04-01 DIAGNOSIS — C50911 Malignant neoplasm of unspecified site of right female breast: Secondary | ICD-10-CM

## 2020-04-01 DIAGNOSIS — Z5112 Encounter for antineoplastic immunotherapy: Secondary | ICD-10-CM | POA: Diagnosis not present

## 2020-04-01 DIAGNOSIS — C7951 Secondary malignant neoplasm of bone: Secondary | ICD-10-CM

## 2020-04-01 DIAGNOSIS — Z95828 Presence of other vascular implants and grafts: Secondary | ICD-10-CM

## 2020-04-01 LAB — CMP (CANCER CENTER ONLY)
ALT: 18 U/L (ref 0–44)
AST: 17 U/L (ref 15–41)
Albumin: 3.9 g/dL (ref 3.5–5.0)
Alkaline Phosphatase: 79 U/L (ref 38–126)
Anion gap: 9 (ref 5–15)
BUN: 9 mg/dL (ref 6–20)
CO2: 25 mmol/L (ref 22–32)
Calcium: 8.9 mg/dL (ref 8.9–10.3)
Chloride: 106 mmol/L (ref 98–111)
Creatinine: 0.94 mg/dL (ref 0.44–1.00)
GFR, Estimated: >60 mL/min (ref >60)
Glucose, Bld: 86 mg/dL (ref 70–99)
Potassium: 3 mmol/L — ABNORMAL LOW (ref 3.5–5.1)
Sodium: 140 mmol/L (ref 135–145)
Total Bilirubin: 3.7 mg/dL (ref 0.3–1.2)
Total Protein: 6.4 g/dL — ABNORMAL LOW (ref 6.5–8.1)

## 2020-04-01 LAB — CBC WITH DIFFERENTIAL (CANCER CENTER ONLY)
Abs Immature Granulocytes: 0.01 10*3/uL (ref 0.00–0.07)
Basophils Absolute: 0 10*3/uL (ref 0.0–0.1)
Basophils Relative: 0 %
Eosinophils Absolute: 0.1 10*3/uL (ref 0.0–0.5)
Eosinophils Relative: 2 %
HCT: 30.5 % — ABNORMAL LOW (ref 36.0–46.0)
Hemoglobin: 10.6 g/dL — ABNORMAL LOW (ref 12.0–15.0)
Immature Granulocytes: 0 %
Lymphocytes Relative: 30 %
Lymphs Abs: 1.1 10*3/uL (ref 0.7–4.0)
MCH: 33.8 pg (ref 26.0–34.0)
MCHC: 34.8 g/dL (ref 30.0–36.0)
MCV: 97.1 fL (ref 80.0–100.0)
Monocytes Absolute: 0.4 10*3/uL (ref 0.1–1.0)
Monocytes Relative: 10 %
Neutro Abs: 2.1 10*3/uL (ref 1.7–7.7)
Neutrophils Relative %: 58 %
Platelet Count: 135 10*3/uL — ABNORMAL LOW (ref 150–400)
RBC: 3.14 MIL/uL — ABNORMAL LOW (ref 3.87–5.11)
RDW: 16.1 % — ABNORMAL HIGH (ref 11.5–15.5)
WBC Count: 3.6 10*3/uL — ABNORMAL LOW (ref 4.0–10.5)
nRBC: 0 % (ref 0.0–0.2)

## 2020-04-01 MED ORDER — HEPARIN SOD (PORK) LOCK FLUSH 100 UNIT/ML IV SOLN
250.0000 [IU] | Freq: Once | INTRAVENOUS | Status: AC | PRN
  Administered 2020-04-01: 500 [IU]
  Filled 2020-04-01: qty 5

## 2020-04-01 MED ORDER — SODIUM CHLORIDE 0.9% FLUSH
10.0000 mL | INTRAVENOUS | Status: DC | PRN
  Administered 2020-04-01: 10 mL via INTRAVENOUS
  Filled 2020-04-01: qty 10

## 2020-04-01 NOTE — Telephone Encounter (Signed)
Critical Value: T bili 3.7 Dr Burr Medico notified

## 2020-04-01 NOTE — Patient Instructions (Signed)

## 2020-04-02 ENCOUNTER — Other Ambulatory Visit: Payer: Self-pay | Admitting: Hematology

## 2020-04-02 DIAGNOSIS — C50911 Malignant neoplasm of unspecified site of right female breast: Secondary | ICD-10-CM

## 2020-04-02 DIAGNOSIS — C7951 Secondary malignant neoplasm of bone: Secondary | ICD-10-CM

## 2020-04-04 ENCOUNTER — Telehealth: Payer: Self-pay

## 2020-04-04 NOTE — Telephone Encounter (Signed)
I contacted [facility] and requested services for 3/29 and 4/5.

## 2020-04-09 ENCOUNTER — Other Ambulatory Visit (HOSPITAL_COMMUNITY): Payer: Self-pay

## 2020-04-09 ENCOUNTER — Other Ambulatory Visit: Payer: Self-pay

## 2020-04-09 ENCOUNTER — Ambulatory Visit (HOSPITAL_COMMUNITY)
Admission: RE | Admit: 2020-04-09 | Discharge: 2020-04-09 | Disposition: A | Discharge status: Home / Self Care | Payer: Medicaid Other | Source: Ambulatory Visit | Attending: Hematology | Admitting: Hematology

## 2020-04-09 DIAGNOSIS — K760 Fatty (change of) liver, not elsewhere classified: Secondary | ICD-10-CM | POA: Diagnosis not present

## 2020-04-09 DIAGNOSIS — C7951 Secondary malignant neoplasm of bone: Secondary | ICD-10-CM | POA: Diagnosis present

## 2020-04-09 DIAGNOSIS — J9811 Atelectasis: Secondary | ICD-10-CM | POA: Diagnosis not present

## 2020-04-09 DIAGNOSIS — C50911 Malignant neoplasm of unspecified site of right female breast: Secondary | ICD-10-CM | POA: Diagnosis present

## 2020-04-09 DIAGNOSIS — C50919 Malignant neoplasm of unspecified site of unspecified female breast: Secondary | ICD-10-CM | POA: Diagnosis not present

## 2020-04-09 DIAGNOSIS — I700 Atherosclerosis of aorta: Secondary | ICD-10-CM | POA: Diagnosis not present

## 2020-04-09 DIAGNOSIS — Z9011 Acquired absence of right breast and nipple: Secondary | ICD-10-CM | POA: Diagnosis not present

## 2020-04-09 DIAGNOSIS — S2232XA Fracture of one rib, left side, initial encounter for closed fracture: Secondary | ICD-10-CM | POA: Diagnosis not present

## 2020-04-09 MED ORDER — HEPARIN SOD (PORK) LOCK FLUSH 100 UNIT/ML IV SOLN: 500.0000 [IU] | Freq: Once | INTRAVENOUS | Status: AC

## 2020-04-09 MED ORDER — IOHEXOL 300 MG/ML  SOLN
100.0000 mL | Freq: Once | INTRAMUSCULAR | Status: AC | PRN
  Administered 2020-04-09: 100 mL via INTRAVENOUS

## 2020-04-09 MED ORDER — HEPARIN SOD (PORK) LOCK FLUSH 100 UNIT/ML IV SOLN
INTRAVENOUS | Status: AC
  Administered 2020-04-09: 500 [IU] via INTRAVENOUS
  Filled 2020-04-09: qty 5

## 2020-04-10 ENCOUNTER — Other Ambulatory Visit: Payer: Self-pay

## 2020-04-10 ENCOUNTER — Telehealth: Payer: Self-pay

## 2020-04-10 DIAGNOSIS — C50911 Malignant neoplasm of unspecified site of right female breast: Secondary | ICD-10-CM

## 2020-04-10 DIAGNOSIS — R17 Unspecified jaundice: Secondary | ICD-10-CM

## 2020-04-10 DIAGNOSIS — C7951 Secondary malignant neoplasm of bone: Secondary | ICD-10-CM

## 2020-04-10 NOTE — Telephone Encounter (Signed)
I spoke with [name] and let her know Dr Burr Medico is recommending a MRI of the abdomen.  Yuval states she needs sedated for this.  New order placed for MRI with sedation.

## 2020-04-10 NOTE — Progress Notes (Signed)
[place]   [phone] Fax:([phone]) [phone]   Clinic Follow up Note   Patient Care Team: [name], [name], [name] as PCP - General (Physician Assistant) [name], MD as Consulting Physician (Orthopedic Surgery)  Date of Service:  04/12/2020  CHIEF COMPLAINT: f/u of metastatic breast cancer  SUMMARY OF ONCOLOGIC HISTORY: Oncology History Overview Note   Cancer Staging No matching staging information was found for the patient.     [name], right ([place])  11/1997 Cancer Diagnosis   Patient had 1.4 cm poorly differentiated right breast carcinoma diagnosed in Nov 1999 at age 5, 1/7 axillary nodes involved, ER/PR and HER 2 positive. She had mastectomy with the 7 axillary node evaluation, 4 cycles of adriamycin/ cytoxan followed by taxotere, then five years of tamoxifen thru June 2005 (no herceptin in 1999). She had bilateral oophorectomy in May 2005. She was briefly on aromatase inhibitor after tamoxifen, but discontinued this herself due to poor tolerance.   04/04/2015 Genetic Testing   Negative genetic testing on the breast/ovarian cancer pnael.  The Breast/Ovarian gene panel offered by [facility] includes sequencing and rearrangement analysis for the following 20 genes:  ATM, BARD1, BRCA1, BRCA2, BRIP1, CDH1, CHEK2, EPCAM, FANCC, MLH1, MSH2, MSH6, NBN, PALB2, PMS2, PTEN, RAD51C, RAD51D, TP53, and XRCC2.   06/26/2015 Pathology Results   Initial Path Report Bone, curettage, Left femur met, intramedullary subtroch tissue - METASTATIC ADENOCARCINOMA.  Estrogen Receptor: 95%, POSITIVE, STRONG STAINING INTENSITY Progesterone Receptor: 2%, POSITIVE, STRONG  HER2 (+), IHC 3+   06/26/2015 Surgery   Biopsy of metastatic tissue and stabilization with Affixus trochanteric nail, proximal and distal interlock of left femur by Dr. [name]    06/28/2015 Imaging   CT Chest w/ Contrast 1. Extensive right internal mammary chain  lymphadenopathy consistent with metastatic breast cancer. 2. Lytic metastases involving the posterior elements at T1 with probable nondisplaced pathologic fracture of the spinous process. 3. No other evidence of thoracic metastatic disease. 4. Trace bilateral pleural effusions with associated bibasilar atelectasis. 5. Indeterminate right thyroid nodule, not previously imaged. This could be evaluated with thyroid ultrasound as clinically warranted.   06/29/2015 -  Chemotherapy   Zometa started monthly on 06/29/15 and switched to q62month from 01/08/17   07/01/2015 Imaging   Bone Scan 1. Increased activity noted throughout the left femur. Although metastatic disease cannot be excluded, these changes are most likely secondary to prior surgery. 2. No other focal abnormalities identified to suggest metastatic disease.   07/12/2015 - 07/26/2015 Radiation Therapy   Left femur, 30 Gy in 10 fractions by Dr. [name]   07/14/2015 Initial Diagnosis   [name], right ([place]   07/19/2015 Imaging   Initial PET Scan 1. Severe multifocal osseous metastatic disease, much greater than anticipated based on prior imaging. 2. Multifocal nodal metastases involving the right internal mammary, prevascular and subpectoral lymph nodes. No axillary pulmonary involvement identified. 3. No distant extra osseous metastases.   07/30/2015 - 11/29/2015 Chemotherapy   Docetaxel, Herceptin and Perjeta every 3 weeks X6 cycles, pt tolerated moderately well, restaging scan showed excellent response    09/18/2015 Imaging   CT Head w/wo Contrast 1. No acute intracranial abnormality or significant interval change. 2. Stable minimal periventricular white matter hypoattenuation on the right. This may reflect a remote ischemic injury. 3. No evidence for metastatic disease to the brain. 4. No focal soft tissue lesion to explain the patient's tenderness.   10/30/2015 Imaging  CT Abdomen Pelvis w/  Contrast 1. Few mildly prominent fluid-filled loops of small bowel scattered throughout the abdomen, with intraluminal fluid density within the distal colon. Given the provided history, findings are suggestive of acute enteritis/diarrheal illness. 2. No other acute intra-abdominal or pelvic process identified. 3. Widespread osseous metastatic disease, better evaluated on most recent PET-CT from 07/19/2015. No pathologic fracture or other complication. 4. 11 mm hypodensity within the left kidney. This lesion measures intermediate density, and is indeterminate. While this lesion is similar in size relative to recent studies, this is increased in size relative to prior study from 2012. Further evaluation with dedicated renal mass protocol CT and/or MRI is recommended for complete characterization.   12/2015 - 05/14/2016 Chemotherapy   Maintenance Herceptin and pejeta every 3 weeks stopped on 05/14/16 due to disease progression   12/25/2015 Imaging   Restaging PET Scan 1. Markedly improved skeletal activity, with only several faint foci of residual accentuated metabolic activity, but resolution of the vast majority of the previously extensive osseous metastatic disease. 2. Resolution of the prior right internal mammary and right prevascular lymph nodes. 3. Residual subcutaneous edema and edema along fascia planes in the left upper thigh. This remains somewhat more than I would expect for placement of an IM nail 6 weeks ago. If there is leg swelling further down, consider Doppler venous ultrasound to rule out left lower extremity DVT. I do not perceive an obvious difference in density between the pelvic and common femoral veins on the noncontrast CT data. 4. Chronic right maxillary and right sphenoid sinusitis.   01/16/2016 - 10/20/2017 Anti-estrogen oral therapy   Letrozole 2.5 mg daily. She was switched to exemestane due to disease progression on 02/08/17. Stopped on 10/20/2017 when treatment changed to chemo     03/01/2016 Miscellaneous   Patient presented to ED following a fall; she reports she landed on her back and is now experiencing back pain. The patient was evaluated and discharge home same day with pain control.   05/01/2016 Imaging   CT CAP IMPRESSION: 1. Stable exam.  No new or progressive disease identified. 2. No evidence for residual or recurrent adenopathy within the chest. 3. Stable bone metastasis.   05/01/2016 Imaging   BONE SCAN IMPRESSION: 1. Small focal uptake involving the anterior rib ends of the left second and right fourth ribs, new since the prior bone scan. The location of this uptake is more suggestive of a traumatic or inflammatory etiology as opposed to metastatic disease. No other evidence of metastatic disease. 2. There are other areas of stable uptake which are most likely degenerative/reactive, including right shoulder and cervical spine uptake and uptake along the right femur adjacent to the ORIF hardware.   05/20/2016 Imaging   NM PET Skull to Thigh IMPRESSION: 1. There multiple metastatic foci in the bony pelvis, including some new abnormal foci compare to the prior PET-CT, compatible with mildly progressive bony metastatic disease. Also, a left T11 vertebral hypermetabolic metastatic lesion is observed, new compared to the prior exam. 2. No active extraosseous malignancy is currently identified. 3. Right anterior fourth rib healing fracture, appears be   06/04/2016 - 01/08/2017 Chemotherapy   Second line chemotherapy Kadcyla every 3 weeks started on 06/04/16 and stopped on 01/08/17 due to mixed response.     10/05/2016 Imaging   CT CAP and Bone Scan:  Bones: left intramedullary rod and left femoral head neck screw in place. In the proximal diaphysis of the left femur there is cortical thickening and  irregularity. No lytic or blastic bone lesions. No fracture or vertebral endplate destruction.   No definite evidence of recurrent disease in the  chest, abdomen, or pelvis.   10/28/2016 Imaging   CT A/P IMPRESSION: No acute process demonstrated in the abdomen or pelvis. No evidence of bowel obstruction or inflammation.   11/04/2016 Imaging   DG foot complete left: IMPRESSION: No fracture or dislocation.  No soft tissue abnormality   11/23/2016 Imaging   CT RIGHT FEMUR IMPRESSION: Negative CT scan of the right thigh.  No visible metastatic disease.  CT LEFT FEMUR IMPRESSION: 1. Postsurgical changes related to prior cephalomedullary rod fixation of the left femur with linear lucency along the anterolateral proximal femoral cortex at level of the lesser trochanter, suspicious for nondisplaced fracture. 2. Slightly permeative appearance of the proximal left femoral cortex at the site of known prior osseous metastases is largely unchanged. 3. Unchanged patchy lucency and sclerosis along the anterior acetabulum, which appears to correspond with an area of faint uptake on the prior PET-CT, suspicious for osseous metastasis. These results will be called to the ordering clinician or representative by the Radiologist Assistant, and communication documented in the PACS or zVision Dashboard.     01/27/2017 Imaging   IMPRESSION: 1. Mixed response to osseous metastasis. Some hypermetabolic lesions are new and increasingly hypermetabolic, while another index lesion is decreasingly hypermetabolic. 2. No evidence of hypermetabolic soft tissue metastasis.    02/08/2017 - 10/19/2017 Antibody Plan   -Herceptin every 3 week since 02/08/17 -Oral Lanpantinib 1562m and decreased to 5045mdue to diarrhea and depression. Due to disease progression she swithced to Verzenio 15035mn 05/04/17. She did not toelrate well so she was switched to Ibrance 100m50m 05/31/17. Due to her neutropenia, Ibrance dose reduced to 75 mg daily, 3 weeks on, one-week off, stopped on 10/20/2017 due to disease progression.    04/30/2017 PET scan   IMPRESSION: 1.  Primarily increase in hypermetabolism of osseous metastasis. An isolated right acetabular lesion is decreased in hypermetabolism since the prior exam. 2. No evidence of hypermetabolic soft tissue metastasis. 3. Similar non FDG avid right-sided thyroid nodule, favoring a benign etiology. Recommend attention on follow-up.   08/30/2017 PET scan   08/30/2017 PET Scan  IMPRESSION: 1. Worsening osseous metastatic disease. 2. Focal hypermetabolism in the central spinal canal the L1 level, similar to the prior exam. Metastatic implant cannot be excluded. 3. Slight enlargement of a right thyroid nodule which now appears more cystic in character. Consider further evaluation with thyroid ultrasound. If patient is clinically hyperthyroid, consider nuclear medicine thyroid uptake and scan   08/30/2017 Progression   08/30/2017 PET Scan  IMPRESSION: 1. Worsening osseous metastatic disease. 2. Focal hypermetabolism in the central spinal canal the L1 level, similar to the prior exam. Metastatic implant cannot be excluded. 3. Slight enlargement of a right thyroid nodule which now appears more cystic in character. Consider further evaluation with thyroid ultrasound. If patient is clinically hyperthyroid, consider nuclear medicine thyroid uptake and scan   09/29/2017 - 10/11/2017 Radiation Therapy   Pt received 27 Gy in 9 fractions directed at the areas of significant uptake noted on recent bone scan the right pelvis and right proximal femur, with Dr, KinaSondra Come10/09/2017 - 04/07/2018 Chemotherapy   -Herceptin every 3 weeks starting 02/08/17, perjeta added on 10/20/2017  -weekly Taxol started on 10/21/2017. Will reduce Taxol to 2 weeks on, 1 week off starting on 02/04/17.  Stoped due to mild disease progression  10/27/2017 Imaging   10/27/2017 CT AP IMPRESSION: 1. No acute abnormality. No evidence of bowel obstruction or acute bowel inflammation. Normal appendix. 2. Stable patchy sclerotic osseous  metastases throughout the visualized skeleton. No new or progressive metastatic disease in the abdomen or pelvis.   01/11/2018 Imaging   Whole Body Bone Scan 01/11/18  IMPRESSION: 1. Multifocal osseous metastases as described. 2. Right scapular metastasis. 3. Right superior thoracic spinous process. 4. Right sacral ala and right L5 metastases. 5. Right acetabular metastasis. 6. Right proximal femur metastasis. 7. Diffuse metastases throughout the left femur. 8. Anterior right fourth rib metastasis.   01/11/2018 Imaging   CT CAP W Contrast 01/11/18  IMPRESSION: 1. Similar-appearing osseous metastatic disease. 2. No evidence for progressed metastatic disease in the chest, abdomen or pelvis.   03/30/2018 Imaging   Bone Scan 03/30/18  IMPRESSION: Stable multifocal bony metastatic disease.   03/30/2018 Imaging    CT CAP W Contrast 03/30/18 IMPRESSION: 1. New mild contiguous wall thickening with associated mucosal hyperenhancement throughout the left colon and rectum, suggesting nonspecific infectious or inflammatory proctocolitis. Differential includes C diff colitis. 2. Stable faint patchy sclerosis in the iliac bones and lumbar vertebral bodies. No new or progressive osseous metastatic disease by CT. Please note that the osseous lesions are relatively occult by CT and would be better evaluated by PET-CT, as clinically warranted. 3. No evidence of extra osseous metastatic disease.  These results will be called to the ordering clinician or representative by the Radiologist Assistant, and communication documented in the PACS or zVision Dashboard.   04/15/2018 - 06/16/2018 Chemotherapy   Enhertu q3weeks starting 04/15/18. Stopped Enhertu after cycle 3 on 05/27/18 due to poor toleration due to poor toleration with anorexia, weight loss, N&V, diarrhea   04/21/2018 PET scan   PET 04/21/18  IMPRESSION: 1. Overall marked interval improvement in bony hypermetabolic disease. All of the  previously identified hypermetabolic osseous metastases have decreased in hypermetabolism in the interval. There is a new tiny focus of hypermetabolism in the right aspect of the L3 vertebral body that appears to correspond to a new 8 mm lucency, raising concern for new metastatic disease. Close attention on follow-up recommended. 2. Focal hypermetabolism in the central spinal canal at the level of L1 previously has resolved. 3. No hypermetabolic soft tissue disease on today's study. 4. Right thyroid nodule seen previously has decreased in the interval.    06/16/2018 Imaging   CT LUMBAR SPINE WO CONTRAST 06/16/18 IMPRESSION: 1. Transitional lumbosacral anatomy. Correlation with radiographs is recommended prior to any operative intervention. 2. Suspicion of a right L3 vertebral body metastasis on the April PET-CT is not evident by CT. Stable degenerative versus metastatic lesion at the right L5 superior endplate. Small but increased right iliac bone metastasis since March. Lumbar MRI would best characterize lumbar metastatic disease (without contrast if necessary). 3. Suspect L2-L3 left subarticular segment disc herniation. Query left L2 radiculitis. 4. Stable appearance of rightward L4-L5 disc degeneration which could affect the right foramen and right lateral recess.   06/17/2018 - 11/16/2018 Chemotherapy   Restart Taxol 2 weeks on/1 week off with Herceptin and Perjeta q3weeks on 06/17/18. Perjeta was stopped after C5 due to diarrhea. Stopped Taxol after 11/16/18 due to disease progression to L3.    07/06/2018 PET scan   PET  IMPRESSION: 1. Near total resolution of hypermetabolic activity associated with skeletal metastasis. Very mild activity associated with the LEFT iliac bone which is similar to background blood pool activity. 2. No new   or progressive disease.   11/21/2018 PET scan   IMPRESSION: 1. New focal hypermetabolic lesion eccentric to the right in the L3 vertebral body,  maximum SUV 6.1 compatible with a new focus of active malignancy. 2. Very low-grade activity similar to blood pool in previous existing mildly sclerotic metastatic lesions including the left iliac bone lesion. Some of the mildly sclerotic lesions are not hypermetabolic compatible with effectively treated bony metastatic disease. 3. No hypermetabolic soft tissue lesions are identified.     11/30/2018 - 08/25/2019 Chemotherapy   Continue Herceptin q3weeks. Stopped 08/25/19 due to significant disease progression and switched to  IV Matuzumab   12/12/2018 - 12/17/2018 Chemotherapy   Tucatinib (TUKYSA) 6 tabs BID as a next line therapy starting 12/12/18. Stopped 12/17/18 duet o N&V, poor appetite and HA    12/21/2018 - 03/17/2019 Chemotherapy   IV Eribulin on day 1, 8 every 21 days starting 12/21/18. Stopped 03/17/19 due to mixed response to treatment.    03/21/2019 PET scan   IMPRESSION: 1. Mixed response to therapy of osseous metastasis. Although a right-sided L4 lesion demonstrates decreased hypermetabolism today, new left iliac and right ischial hypermetabolic lesions are seen. 2. No evidence of hypermetabolic soft tissue metastasis.     03/27/2019 - 07/21/2019 Chemotherapy   Neratinib (Nerlynx) titrate up from 120mg to 240mg starting 03/27/19. She could not tolerate 160mg and stopped on 02/05/19 due to N&V and diarrhea. Will restart at 120mg on 04/10/18-04/27/19 and 4/19-4/22. Reduced to 80mg once daily on 05/10/19. Held/stopped since 07/21/19 due to dry cough, Nausea and body aches.   06/23/2019 Imaging   CT Head  IMPRESSION: 1. No acute intracranial abnormality or metastatic disease identified. 2. Chronic white matter disease in the right hemisphere is stable from prior exams.     09/05/2019 PET scan   IMPRESSION: 1. Significant progression of widespread hypermetabolic osseous metastases throughout the axial skeleton as detailed. 2. No hypermetabolic extraosseous sites of metastatic disease.    09/15/2019 - 12/22/2019 Chemotherapy   -IV Margetuximab q3weeks and IV Gemcitabine 2 weeks on/1 week off starting 09/15/19. Carboplatin added to Gemcitabine starting with C3 on 11/03/19. D/c after C5 due to disease progression in bone.    12/13/2019 Imaging   PET  IMPRESSION: 1. Interval progression of widespread, FDG avid osseous metastasis involving the axial skeleton. 2. No FDG avid extra osseous sites of metastatic disease.     01/19/2020 -  Chemotherapy   -Xeloda BID 2 weeks on/1 week off and Herceptin q3weeks starting 01/19/20. C1 at 1000mg BID. C2 increased to 1500mg in the AM and 1000mg in the PM starting 02/12/20.   04/09/2020 Imaging   CT CAP  IMPRESSION: 1. New 3 mm RIGHT upper lobe nodule, nonspecific, attention on follow-up. 2. What may represent an incomplete periprosthetic fracture about the cephalo medullary rod in the LEFT proximal femur. This linear band of incomplete bony fusion that is more pronounced than on previous CT imaging that was acquired in 2017 and may represent a stress type fracture about hardware. Correlate with any pain in this area. Sclerotic margins show chronic features but the linear band passing through the bone around the intramedullary rod is more extensive, involving approximately 270 degrees of the circumference of the femur, medial cortex remains intact. 3. Other findings of bony metastatic disease are similar to prior imaging. Sclerosis and some periosteal reaction about a nondisplaced pathologic fracture of the LEFT lateral seventh rib. 4. Small hypervascular focus in the RIGHT hepatic lobe, indistinct but new compared to   previous imaging. Perhaps this represents a transient phenomenon though is not imaged on the later phase to allow for confirmation. Liver MRI may be helpful for further evaluation, to exclude underlying metastatic lesion as clinically warranted. 5. Hepatic steatosis. 6. Aortic atherosclerosis.   Aortic Atherosclerosis  (ICD10-I70.0).        CURRENT THERAPY:  -Zometa q3months -Xeloda BID 2 weeks on/1 week off and Herceptinq3weeks starting1/7/22.C1 at 1000mg BID.C2 increased to 1500mg in the AM and 1000mg in the PMstarting 02/12/20.Held since 03/22/20 due to hyperbilirubinemia.   INTERVAL HISTORY:  [name] is here for a follow up. She presents to the clinic with her interpretor. She notes she is doing fairly well. She notes she still has right hip pain flares after prior fall 3-4 weeks. She notes can ambulate well without assistance. She notes her left upper leg and hip pain from her cancer is stable. She notes off Xeloda she has had nausea and fatigue. She does not drink much or urinate much.   She notes she prefers I notify her through Mychart message instead of phone calls, because it comes through fuzzy.     REVIEW OF SYSTEMS:   Constitutional: Denies fevers, chills or abnormal weight loss Eyes: Denies blurriness of vision Ears, nose, mouth, throat, and face: Denies mucositis or sore throat Respiratory: Denies cough, dyspnea or wheezes Cardiovascular: Denies palpitation, chest discomfort or lower extremity swelling Gastrointestinal:  Denies nausea, heartburn or change in bowel habits Skin: Denies abnormal skin rashes MSK: (+) B/l hip pain, R>L Lymphatics: Denies new lymphadenopathy or easy bruising Neurological:Denies numbness, tingling or new weaknesses Behavioral/Psych: Mood is stable, no new changes  All other systems were reviewed with the patient and are negative.  MEDICAL HISTORY:  Past Medical History:  Diagnosis Date  . Abnormal Pap smear   . Anxiety   . Breast cancer (HCC)   . Cancer (HCC) 1999   breast-s/p mastectomy, chemo, rad  . CIN I (cervical intraepithelial neoplasia I) 2003   by colpo  . Congenital deafness   . Deaf   . Depression   . Port-A-Cath in place 2017   for chemotherapy  . Radiation 07/12/15-07/26/15   left femur 30 Gy  . S/P bilateral  oophorectomy     SURGICAL HISTORY: Past Surgical History:  Procedure Laterality Date  . ABDOMINAL HYSTERECTOMY    . INTRAMEDULLARY (IM) NAIL INTERTROCHANTERIC Left 06/26/2015   Procedure: LEFT BIOMET LONG AFFIXS NAIL;  Surgeon: Mark C Yates, MD;  Location: [facility] OR;  Service: Orthopedics;  Laterality: Left;  . IR GENERIC HISTORICAL  08/06/2015   IR US GUIDE VASC ACCESS LEFT 08/06/2015 Glenn Yamagata, MD [facility]  . IR GENERIC HISTORICAL  08/06/2015   IR FLUORO GUIDE CV LINE LEFT 08/06/2015 Glenn Yamagata, MD [facility]  . IR GENERIC HISTORICAL  03/05/2016   IR CV LINE INJECTION 03/05/2016 Adam Henn, MD [facility]  . LEFT HEART CATH AND CORONARY ANGIOGRAPHY N/A 12/13/2017   Procedure: LEFT HEART CATH AND CORONARY ANGIOGRAPHY;  Surgeon: Harding, David W, MD;  Location: [facility] INVAS[facility];  Service: Cardiovascular;  Laterality: N/A;  . MASTECTOMY     right breast  . MASTECTOMY    . OVARIAN CYST REMOVAL    . RADIOLOGY WITH ANESTHESIA Left 06/25/2015   Procedure: MRI OF LEFT HIP WITH OR WITHOUT CONTRAST;  Surgeon: Medication Radiologist, MD;  Location: [facility] OR;  Service: Radiology;  Laterality: Left;  DR. MCINTYRE/MRI  . TUBAL LIGATION      I have   reviewed the social history and family history with the patient and they are unchanged from previous note.  ALLERGIES:  is allergic to promethazine hcl, chlorhexidine, and diphenhydramine hcl.  MEDICATIONS:  Current Outpatient Medications  Medication Sig Dispense Refill  . ALPRAZolam (XANAX) 0.25 MG tablet Take 1 tablet (0.25 mg total) by mouth 2 (two) times daily as needed for anxiety. 30 tablet 0  . capecitabine (XELODA) 500 MG tablet Take 3 tabs in morning, and 2 tabs in evening. Take for 14 days on, 7 days off. Repeat every 21 days. Start on 03/04/2020. 70 tablet 1  . diphenoxylate-atropine (LOMOTIL) 2.5-0.025 MG tablet Take 1-2 tablets by mouth 4 (four) times daily as needed for diarrhea or loose stools. 60 tablet 1  . lidocaine-prilocaine  (EMLA) cream Apply 1 application topically as needed. Apply to Porta-Cath 1-2 hours prior to access as directed. 90 g 2  . ondansetron (ZOFRAN) 8 MG tablet Take 1 tablet (8 mg total) by mouth every 8 (eight) hours as needed for nausea or vomiting. 45 tablet 1  . oxyCODONE (ROXICODONE) 5 MG immediate release tablet Take 1 tablet (5 mg total) by mouth every 8 (eight) hours as needed for severe pain. Refill on 02/17/2020 90 tablet 0  . potassium chloride (KLOR-CON) 10 MEQ tablet Take 3 tablets (30 mEq total) by mouth daily. 90 tablet 2  . prochlorperazine (COMPAZINE) 10 MG tablet Take 1 tablet (10 mg total) by mouth 2 (two) times daily as needed for nausea or vomiting. 20 tablet 1  . zolpidem (AMBIEN) 10 MG tablet Take 1 tablet (10 mg total) by mouth at bedtime as needed for sleep. 15 tablet 2   No current facility-administered medications for this visit.   Facility-Administered Medications Ordered in Other Visits  Medication Dose Route Frequency Provider Last Rate Last Admin  . potassium chloride SA (K-DUR) CR tablet 40 mEq  40 mEq Oral Once Truitt Merle, MD      . sodium chloride 0.9 % 1,000 mL with potassium chloride 10 mEq infusion   Intravenous Continuous Gordy Levan, MD   Stopped at 09/10/15 1653  . sodium chloride 0.9 % injection 10 mL  10 mL Intravenous PRN Livesay, Lennis P, MD        PHYSICAL EXAMINATION: ECOG PERFORMANCE STATUS: 1 - Symptomatic but completely ambulatory  Vitals:   04/12/20 0843  BP: 131/72  Pulse: 87  Resp: 18  Temp: 97.9 F (36.6 C)  SpO2: 99%   Filed Weights   04/12/20 0843  Weight: 136 lb 14.4 oz (62.1 kg)    GENERAL:alert, no distress and comfortable SKIN: skin color, texture, turgor are normal, no rashes or significant lesions EYES: normal, Conjunctiva are pink and non-injected, sclera clear  NECK: supple, thyroid normal size, non-tender, without nodularity LYMPH:  no palpable lymphadenopathy in the cervical, axillary  LUNGS: clear to auscultation  and percussion with normal breathing effort HEART: regular rate & rhythm and no murmurs and no lower extremity edema ABDOMEN:abdomen soft, non-tender and normal bowel sounds (+) Right abdominal tenderness Musculoskeletal:no cyanosis of digits and no clubbing (+) Upper right hip tenderness  NEURO: alert & oriented x 3 with fluent speech, no focal motor/sensory deficits  LABORATORY DATA:  I have reviewed the data as listed CBC Latest Ref Rng & Units 04/12/2020 04/01/2020 03/22/2020  WBC 4.0 - 10.5 K/uL 3.9(L) 3.6(L) 3.5(L)  Hemoglobin 12.0 - 15.0 g/dL 11.2(L) 10.6(L) 10.3(L)  Hematocrit 36.0 - 46.0 % 32.0(L) 30.5(L) 29.2(L)  Platelets 150 - 400 K/uL 199  135(L) 166     CMP Latest Ref Rng & Units 04/12/2020 04/01/2020 03/22/2020  Glucose 70 - 99 mg/dL 84 86 83  BUN 6 - 20 mg/dL 13 9 11  Creatinine 0.44 - 1.00 mg/dL 0.91 0.94 0.89  Sodium 135 - 145 mmol/L 141 140 140  Potassium 3.5 - 5.1 mmol/L 3.6 3.0(L) 3.0(L)  Chloride 98 - 111 mmol/L 105 106 106  CO2 22 - 32 mmol/L 26 25 26  Calcium 8.9 - 10.3 mg/dL 8.6(L) 8.9 8.7(L)  Total Protein 6.5 - 8.1 g/dL 6.8 6.4(L) 6.4(L)  Total Bilirubin 0.3 - 1.2 mg/dL 2.5(H) 3.7(HH) 3.2(H)  Alkaline Phos 38 - 126 U/L 76 79 81  AST 15 - 41 U/L 18 17 22  ALT 0 - 44 U/L 19 18 23      RADIOGRAPHIC STUDIES: I have personally reviewed the radiological images as listed and agreed with the findings in the report. No results found.   ASSESSMENT & PLAN:  [name] is a 54 y.o. 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 She had right mastectomy, 4 cycles of AC-T and 5 years of Tamoxifen/AI.  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 -After her first line ofchemotreatment and initial maintenance therapy, she progressed or had poor toleration of multiple lines of treatment since then. -Based on 12/13/19 PET,  she progressed diffusely in the bone on7th linetherapywith anti-HER2IV Margetuximabq3weekswithIV Chemo weeklyGemcitabine(Carboplatin added C3)2 weeks on/1 week offafter 5 cycles,09/15/19-12/22/19. -Ipreviouslydiscussed the longer she is on treatment the less treatment options and efficacy she will have. -I started her onXeloda 1000mg BID 2 weeks on/1 week off for first cycle on 01/19/20. If tolerating well will increase.Continue Herceptin q3weeks.Increased to 1500mg in the AM and1000mgin the PM since C2.  -Since March she has had worsened hyperbilirubinemia. We have held Xeloda since 04/01/20.  -Her CT CAP from 04/09/20 shows stable bone mets, a small and indeterminate lesion in the liver, possible fracture around pin of left hip from prior surgery. I reviewed scan images in person and discussed with patient today. I recommend liver MRI for further evaluation, she declined due to claustrophobia. -She will proceed with bone scan on 04/16/20.  -Her clinical jaundice has improved on exam today (04/12/20). -Labs reviewed, WBC 3.9, Hg 11.2. Overall adequate to proceed with Herceptin today.  -Her liver function has slightly improved, total bilirubin 2.5 today, plan to restart Xeloda at a reduced dose in a week when her liver function improves further.  She will take Xeloda 1000 mg twice daily, for 7 days on and 7 days off.  I will refill for her today. -Continue Herceptin every 3 weeks (will change to every 2 weeks from next treatment) and Zometa q3months. Proceed with today.  -F/u in 3 weeks.  2.Body pain, secondary to bone mets, Hypocalcemia, leg cramps  -She is being treated with Zometa q3months. -She will continue oral calcium and Vit Dand Prilosec for Acid Reflux. -Current pain includes, lower back, upper legs, left hip and rightshoulder.PET12/3/21does indicate disease progression in her spine, pelvis and right scapula.She also has headaches occasionally.  -Pain has been  improving lately. She uses 5mg Oxycodoneas needed, not daily.  -After fall in early March she has right hip and side pain. Her left hip and leg pain is stable.  -Her 03/20/20 CT CAP shows possible fracture around pin of left hip from   prior surgery. I will consult with her Orthopedic surgeon about this. Given her pain is tolerable will monitor.   3.Congenital deafness  4.Mild anemia, secondary to chemotherapy and malignancy -Moderate andStable   5.Hypokalemia -She developed diarrhea s/p cycle 3 Perjetaand again with Nerlynx, Both have been D/c. -She is onOral potassium 10meq2-3 tabs daily. I encouraged her to increase potassium in diet.   6.Goal of care discussion, DNR/DNI -Wepreviouslyreviewed goal of care andI recommend DNR/DNI, shepreviouslyagreed.  -I encouraged her to start setting up her living will.We previously discussed hospice care if she has no more treatment available or could not tolerate more treatment  7.Insomnia -She cannot take Benadryl due to allergy reaction. She has tried trazodone with no relief.  -She is on Xanax andonAmbien10mgat half tabletsnightly. She knows not to take both at the same time. If she needs Oxycodone at night, she can use half tabletonly.  -Stable.  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 Continue monitoring. Stable.  9. Mildhyperbilirubinemia  -likely second to chemotherapy, fluctuates  -Worsened on 03/22/20 with total bilirubin 3.2. Increased again to 3.7 on 04/01/20. Xeloda has been held 03/22/20 -There is subtle change in liver on her 04/09/20 CT CAP, but she declined MRI Liver for further evaluation.  -Her clinical jaundice has improved on exam today (04/12/20). If her liver function improves, will restart Xeloda.    PLAN: -Labs reviewed,will proceed withHerceptin today. -restart Xeloda in one week at reduced dose 1000 mg twice daily, 7 days on and 7 days off -Proceed with Zometa today. Continue Zometa every 3  months. -Lab, flush, follow-up and Herceptin in 3 weeks   No problem-specific Assessment & Plan notes found for this encounter.   No orders of the defined types were placed in this encounter.  All questions were answered. The patient knows to call the clinic with any problems, questions or concerns. No barriers to learning was detected. The total time spent in the appointment was 30 minutes.      , MD 04/12/2020   I, Amoya Bennett, am acting as scribe for  , MD.   I have reviewed the above documentation for accuracy and completeness, and I agree with the above.       

## 2020-04-12 ENCOUNTER — Inpatient Hospital Stay: Payer: Medicaid Other | Attending: Hematology

## 2020-04-12 ENCOUNTER — Inpatient Hospital Stay (HOSPITAL_BASED_OUTPATIENT_CLINIC_OR_DEPARTMENT_OTHER): Payer: Medicaid Other | Admitting: Hematology

## 2020-04-12 ENCOUNTER — Inpatient Hospital Stay: Payer: Medicaid Other

## 2020-04-12 ENCOUNTER — Other Ambulatory Visit: Payer: Self-pay

## 2020-04-12 ENCOUNTER — Other Ambulatory Visit: Payer: Self-pay | Admitting: Hematology

## 2020-04-12 ENCOUNTER — Encounter: Payer: Self-pay | Admitting: Hematology

## 2020-04-12 VITALS — BP 131/72 | HR 87 | Temp 97.9°F | Resp 18 | Ht 65.0 in | Wt 136.9 lb

## 2020-04-12 DIAGNOSIS — C50911 Malignant neoplasm of unspecified site of right female breast: Secondary | ICD-10-CM | POA: Insufficient documentation

## 2020-04-12 DIAGNOSIS — C799 Secondary malignant neoplasm of unspecified site: Secondary | ICD-10-CM

## 2020-04-12 DIAGNOSIS — Z5112 Encounter for antineoplastic immunotherapy: Secondary | ICD-10-CM | POA: Diagnosis not present

## 2020-04-12 DIAGNOSIS — E041 Nontoxic single thyroid nodule: Secondary | ICD-10-CM | POA: Insufficient documentation

## 2020-04-12 DIAGNOSIS — H905 Unspecified sensorineural hearing loss: Secondary | ICD-10-CM | POA: Diagnosis not present

## 2020-04-12 DIAGNOSIS — Z170 Estrogen receptor positive status [ER+]: Secondary | ICD-10-CM | POA: Insufficient documentation

## 2020-04-12 DIAGNOSIS — C7951 Secondary malignant neoplasm of bone: Secondary | ICD-10-CM | POA: Diagnosis not present

## 2020-04-12 DIAGNOSIS — G4700 Insomnia, unspecified: Secondary | ICD-10-CM | POA: Insufficient documentation

## 2020-04-12 DIAGNOSIS — D6481 Anemia due to antineoplastic chemotherapy: Secondary | ICD-10-CM | POA: Diagnosis not present

## 2020-04-12 DIAGNOSIS — E876 Hypokalemia: Secondary | ICD-10-CM | POA: Insufficient documentation

## 2020-04-12 DIAGNOSIS — Z95828 Presence of other vascular implants and grafts: Secondary | ICD-10-CM

## 2020-04-12 DIAGNOSIS — C50919 Malignant neoplasm of unspecified site of unspecified female breast: Secondary | ICD-10-CM

## 2020-04-12 DIAGNOSIS — G893 Neoplasm related pain (acute) (chronic): Secondary | ICD-10-CM | POA: Diagnosis not present

## 2020-04-12 DIAGNOSIS — G620 Drug-induced polyneuropathy: Secondary | ICD-10-CM | POA: Insufficient documentation

## 2020-04-12 LAB — CBC WITH DIFFERENTIAL (CANCER CENTER ONLY)
Abs Immature Granulocytes: 0.01 10*3/uL (ref 0.00–0.07)
Basophils Absolute: 0 10*3/uL (ref 0.0–0.1)
Basophils Relative: 1 %
Eosinophils Absolute: 0.1 10*3/uL (ref 0.0–0.5)
Eosinophils Relative: 2 %
HCT: 32 % — ABNORMAL LOW (ref 36.0–46.0)
Hemoglobin: 11.2 g/dL — ABNORMAL LOW (ref 12.0–15.0)
Immature Granulocytes: 0 %
Lymphocytes Relative: 28 %
Lymphs Abs: 1.1 10*3/uL (ref 0.7–4.0)
MCH: 33.8 pg (ref 26.0–34.0)
MCHC: 35 g/dL (ref 30.0–36.0)
MCV: 96.7 fL (ref 80.0–100.0)
Monocytes Absolute: 0.5 10*3/uL (ref 0.1–1.0)
Monocytes Relative: 11 %
Neutro Abs: 2.3 10*3/uL (ref 1.7–7.7)
Neutrophils Relative %: 58 %
Platelet Count: 199 10*3/uL (ref 150–400)
RBC: 3.31 MIL/uL — ABNORMAL LOW (ref 3.87–5.11)
RDW: 13.4 % (ref 11.5–15.5)
WBC Count: 3.9 10*3/uL — ABNORMAL LOW (ref 4.0–10.5)
nRBC: 0 % (ref 0.0–0.2)

## 2020-04-12 LAB — CMP (CANCER CENTER ONLY)
ALT: 19 U/L (ref 0–44)
AST: 18 U/L (ref 15–41)
Albumin: 3.9 g/dL (ref 3.5–5.0)
Alkaline Phosphatase: 76 U/L (ref 38–126)
Anion gap: 10 (ref 5–15)
BUN: 13 mg/dL (ref 6–20)
CO2: 26 mmol/L (ref 22–32)
Calcium: 8.6 mg/dL — ABNORMAL LOW (ref 8.9–10.3)
Chloride: 105 mmol/L (ref 98–111)
Creatinine: 0.91 mg/dL (ref 0.44–1.00)
GFR, Estimated: >60 mL/min (ref >60)
Glucose, Bld: 84 mg/dL (ref 70–99)
Potassium: 3.6 mmol/L (ref 3.5–5.1)
Sodium: 141 mmol/L (ref 135–145)
Total Bilirubin: 2.5 mg/dL — ABNORMAL HIGH (ref 0.3–1.2)
Total Protein: 6.8 g/dL (ref 6.5–8.1)

## 2020-04-12 MED ORDER — ACETAMINOPHEN 325 MG PO TABS
ORAL_TABLET | ORAL | Status: AC
  Filled 2020-04-12: qty 2

## 2020-04-12 MED ORDER — PROCHLORPERAZINE MALEATE 10 MG PO TABS
ORAL_TABLET | ORAL | Status: AC
  Filled 2020-04-12: qty 1

## 2020-04-12 MED ORDER — ZOLEDRONIC ACID 4 MG/100ML IV SOLN
INTRAVENOUS | Status: AC
  Filled 2020-04-12: qty 100

## 2020-04-12 MED ORDER — ACETAMINOPHEN 325 MG PO TABS
650.0000 mg | ORAL_TABLET | Freq: Once | ORAL | Status: AC
Start: 2020-04-12 — End: 2020-04-12
  Administered 2020-04-12: 650 mg via ORAL

## 2020-04-12 MED ORDER — SODIUM CHLORIDE 0.9% FLUSH
10.0000 mL | INTRAVENOUS | Status: DC | PRN
  Filled 2020-04-12: qty 10

## 2020-04-12 MED ORDER — TRASTUZUMAB-DKST CHEMO 150 MG IV SOLR
6.0000 mg/kg | Freq: Once | INTRAVENOUS | Status: AC
  Administered 2020-04-12: 357 mg via INTRAVENOUS
  Filled 2020-04-12: qty 17

## 2020-04-12 MED ORDER — HEPARIN SOD (PORK) LOCK FLUSH 100 UNIT/ML IV SOLN
500.0000 [IU] | Freq: Once | INTRAVENOUS | Status: AC | PRN
  Administered 2020-04-12: 500 [IU]
  Filled 2020-04-12: qty 5

## 2020-04-12 MED ORDER — SODIUM CHLORIDE 0.9 % IV SOLN
Freq: Once | INTRAVENOUS | Status: AC
  Filled 2020-04-12: qty 250

## 2020-04-12 MED ORDER — LORATADINE 10 MG PO TABS
10.0000 mg | ORAL_TABLET | Freq: Once | ORAL | Status: AC
  Administered 2020-04-12: 10 mg via ORAL

## 2020-04-12 MED ORDER — LORATADINE 10 MG PO TABS
ORAL_TABLET | ORAL | Status: AC
  Filled 2020-04-12: qty 1

## 2020-04-12 MED ORDER — DEXAMETHASONE 4 MG PO TABS
ORAL_TABLET | ORAL | Status: AC
  Filled 2020-04-12: qty 10

## 2020-04-12 MED ORDER — CAPECITABINE 500 MG PO TABS: ORAL_TABLET | ORAL | 1 refills | Status: DC

## 2020-04-12 MED ORDER — ZOLEDRONIC ACID 4 MG/100ML IV SOLN: 4.0000 mg | Freq: Once | INTRAVENOUS | Status: AC

## 2020-04-12 MED ORDER — SODIUM CHLORIDE 0.9% FLUSH
10.0000 mL | INTRAVENOUS | Status: DC | PRN
  Administered 2020-04-12: 10 mL
  Filled 2020-04-12: qty 10

## 2020-04-12 MED ORDER — ZOLEDRONIC ACID 4 MG/100ML IV SOLN
4.0000 mg | Freq: Once | INTRAVENOUS | Status: DC
  Administered 2020-04-12: 4 mg via INTRAVENOUS
  Filled 2020-04-12: qty 100

## 2020-04-12 MED FILL — CAPECITABINE 500 MG TABLET: 500 | 14 days supply | Qty: 28 | Fill #0

## 2020-04-12 NOTE — Progress Notes (Signed)
Per Dr. Burr Medico - okay to proceed with zometa today and echo from 11/21. Patient will get echo every 6 months now.

## 2020-04-12 NOTE — Patient Instructions (Signed)
[facility] Discharge Instructions for Patients Receiving Chemotherapy  Today you received the following chemotherapy agents: Trastuzumab (Herceptin)  To help prevent nausea and vomiting after your treatment, we encourage you to take your nausea medication  as prescribed.    If you develop nausea and vomiting that is not controlled by your nausea medication, call the clinic.   BELOW ARE SYMPTOMS THAT SHOULD BE REPORTED IMMEDIATELY:  *FEVER GREATER THAN 100.5 F  *CHILLS WITH OR WITHOUT FEVER  NAUSEA AND VOMITING THAT IS NOT CONTROLLED WITH YOUR NAUSEA MEDICATION  *UNUSUAL SHORTNESS OF BREATH  *UNUSUAL BRUISING OR BLEEDING  TENDERNESS IN MOUTH AND THROAT WITH OR WITHOUT PRESENCE OF ULCERS  *URINARY PROBLEMS  *BOWEL PROBLEMS  UNUSUAL RASH Items with * indicate a potential emergency and should be followed up as soon as possible.  Feel free to call the clinic should you have any questions or concerns. The clinic phone number is [phone].  Please show the [facility] at check-in to the Emergency Department and triage nurse.  Zoledronic Acid Injection (Hypercalcemia, Oncology) What is this medicine? ZOLEDRONIC ACID (ZOE le dron ik AS id) slows calcium loss from bones. It high calcium levels in the blood from some kinds of cancer. It may be used in other people at risk for bone loss. This medicine may be used for other purposes; ask your health care provider or pharmacist if you have questions. COMMON BRAND NAME(S): Zometa What should I tell my health care provider before I take this medicine? They need to know if you have any of these conditions:  cancer  dehydration  dental disease  kidney disease  liver disease  low levels of calcium in the blood  lung or breathing disease (asthma)  receiving steroids like dexamethasone or prednisone  an unusual or allergic reaction to zoledronic acid, other medicines, foods, dyes, or  preservatives  pregnant or trying to get pregnant  breast-feeding How should I use this medicine? This drug is injected into a vein. It is given by a health care provider in a hospital or clinic setting. Talk to your health care provider about the use of this drug in children. Special care may be needed. Overdosage: If you think you have taken too much of this medicine contact a poison control center or emergency room at once. NOTE: This medicine is only for you. Do not share this medicine with others. What if I miss a dose? Keep appointments for follow-up doses. It is important not to miss your dose. Call your health care provider if you are unable to keep an appointment. What may interact with this medicine?  certain antibiotics given by injection  NSAIDs, medicines for pain and inflammation, like ibuprofen or naproxen  some diuretics like bumetanide, furosemide  teriparatide  thalidomide This list may not describe all possible interactions. Give your health care provider a list of all the medicines, herbs, non-prescription drugs, or dietary supplements you use. Also tell them if you smoke, drink alcohol, or use illegal drugs. Some items may interact with your medicine. What should I watch for while using this medicine? Visit your health care provider for regular checks on your progress. It may be some time before you see the benefit from this drug. Some people who take this drug have severe bone, joint, or muscle pain. This drug may also increase your risk for jaw problems or a broken thigh bone. Tell your health care provider right away if you have severe pain in your  jaw, bones, joints, or muscles. Tell you health care provider if you have any pain that does not go away or that gets worse. Tell your dentist and dental surgeon that you are taking this drug. You should not have major dental surgery while on this drug. See your dentist to have a dental exam and fix any dental problems  before starting this drug. Take good care of your teeth while on this drug. Make sure you see your dentist for regular follow-up appointments. You should make sure you get enough calcium and vitamin D while you are taking this drug. Discuss the foods you eat and the vitamins you take with your health care provider. Check with your health care provider if you have severe diarrhea, nausea, and vomiting, or if you sweat a lot. The loss of too much body fluid may make it dangerous for you to take this drug. You may need blood work done while you are taking this drug. Do not become pregnant while taking this drug. Women should inform their health care provider if they wish to become pregnant or think they might be pregnant. There is potential for serious harm to an unborn child. Talk to your health care provider for more information. What side effects may I notice from receiving this medicine? Side effects that you should report to your doctor or health care provider as soon as possible:  allergic reactions (skin rash, itching or hives; swelling of the face, lips, or tongue)  bone pain  infection (fever, chills, cough, sore throat, pain or trouble passing urine)  jaw pain, especially after dental work  joint pain  kidney injury (trouble passing urine or change in the amount of urine)  low blood pressure (dizziness; feeling faint or lightheaded, falls; unusually weak or tired)  low calcium levels (fast heartbeat; muscle cramps or pain; pain, tingling, or numbness in the hands or feet; seizures)  low magnesium levels (fast, irregular heartbeat; muscle cramp or pain; muscle weakness; tremors; seizures)  low red blood cell counts (trouble breathing; feeling faint; lightheaded, falls; unusually weak or tired)  muscle pain  redness, blistering, peeling, or loosening of the skin, including inside the mouth  severe diarrhea  swelling of the ankles, feet, hands  trouble breathing Side effects  that usually do not require medical attention (report to your doctor or health care provider if they continue or are bothersome):  anxious  constipation  coughing  depressed mood  eye irritation, itching, or pain  fever  general ill feeling or flu-like symptoms  nausea  pain, redness, or irritation at site where injected  trouble sleeping This list may not describe all possible side effects. Call your doctor for medical advice about side effects. You may report side effects to FDA at 1-800-FDA-1088. Where should I keep my medicine? This drug is given in a hospital or clinic. It will not be stored at home. NOTE: This sheet is a summary. It may not cover all possible information. If you have questions about this medicine, talk to your doctor, pharmacist, or health care provider.  2021 Elsevier/Gold Standard (2018-10-13 09:13:00)

## 2020-04-13 LAB — CANCER ANTIGEN 27.29: CA 27.29: 71.8 U/mL — ABNORMAL HIGH (ref 0.0–38.6)

## 2020-04-14 ENCOUNTER — Ambulatory Visit (HOSPITAL_COMMUNITY): Payer: Medicaid Other

## 2020-04-16 ENCOUNTER — Encounter (HOSPITAL_COMMUNITY): Payer: Medicaid Other

## 2020-04-16 ENCOUNTER — Encounter (HOSPITAL_COMMUNITY): Admission: RE | Admit: 2020-04-16 | Payer: Medicaid Other | Source: Ambulatory Visit

## 2020-04-16 ENCOUNTER — Telehealth: Payer: Self-pay

## 2020-04-16 ENCOUNTER — Other Ambulatory Visit: Payer: Self-pay

## 2020-04-16 NOTE — Telephone Encounter (Signed)
[name] came to [facility] today.  She states she is not going to have the bone scan "because I don't want to wait 4 hours."  I told her she had the right to refuse.

## 2020-04-17 ENCOUNTER — Telehealth: Payer: Self-pay

## 2020-04-17 ENCOUNTER — Other Ambulatory Visit: Payer: Self-pay

## 2020-04-17 DIAGNOSIS — C7951 Secondary malignant neoplasm of bone: Secondary | ICD-10-CM

## 2020-04-17 DIAGNOSIS — C50911 Malignant neoplasm of unspecified site of right female breast: Secondary | ICD-10-CM

## 2020-04-17 NOTE — Telephone Encounter (Signed)
Please see below.

## 2020-04-17 NOTE — Telephone Encounter (Signed)
Dr. Burr Medico with the [facility] would like to discuss thiis pt with Dr. [name] at some time today if he is able to call him on his cell. Than number is [phone]

## 2020-04-17 NOTE — Progress Notes (Signed)
Referral, las ov note, and ct results faxed to Dr Rodell Perna.

## 2020-04-17 NOTE — Telephone Encounter (Signed)
I called reviewed CT and call her.

## 2020-04-23 ENCOUNTER — Other Ambulatory Visit: Payer: Self-pay

## 2020-04-23 ENCOUNTER — Ambulatory Visit (INDEPENDENT_AMBULATORY_CARE_PROVIDER_SITE_OTHER): Payer: Medicaid Other | Admitting: Orthopaedic Surgery

## 2020-04-23 ENCOUNTER — Ambulatory Visit: Payer: Medicaid Other | Admitting: Orthopaedic Surgery

## 2020-04-23 ENCOUNTER — Ambulatory Visit (INDEPENDENT_AMBULATORY_CARE_PROVIDER_SITE_OTHER): Payer: Medicaid Other

## 2020-04-23 DIAGNOSIS — M25552 Pain in left hip: Secondary | ICD-10-CM | POA: Diagnosis not present

## 2020-04-23 DIAGNOSIS — M25559 Pain in unspecified hip: Secondary | ICD-10-CM

## 2020-04-23 NOTE — Progress Notes (Signed)
Office Visit Note   Patient: [name]           Date of Birth: Dec 18, 1965           MRN: [id number] Visit Date: 04/23/2020              Requested by: Truitt Merle, MD [place],  [place] [address] PCP: [name], [place]   Assessment & Plan: Visit Diagnoses: No diagnosis found.  Plan: Patient has subtrochanteric fracture but her locking nail is in good position.  She can treat her symptoms symptomatically use the cane or walker if soreness becomes worse.  Some of the pain may be related to the other pelvic metastatic lesions or possibly from the upper lumbar metastatic lesions that are present.  She does not require any surgery on her hip at this time.  Follow-Up Instructions: No follow-ups on file.   Orders:  No orders of the defined types were placed in this encounter.  No orders of the defined types were placed in this encounter.     Procedures: No procedures performed   Clinical Data: No additional findings.   Subjective: Chief Complaint  Patient presents with  . Left Leg - Pain    HPI 55 year old female seen with breast cancer metastatic to bone on the right with previous trochanteric nail prophylactically done for a lesion in the left femoral neck in her truck region.  She has had some increased discomfort but is able to ambulate without use of a cane.  Patient is deaf and is here with a sign interpreter.  She had multiple metastatic areas in the pelvis including iliac wing,.  Also changes lumbar spine, right inferior pubic rami, right acetabulum, bilateral sacral ala.  CT showed some question and loosening around the trochanteric nail.  Review of Systems all other systems noncontributory to HPI.   Objective: Vital Signs: There were no vitals taken for this visit.  Physical Exam  Ortho Exam  Specialty Comments:  No specialty comments available.  Imaging: No results found.   PMFS History: Patient Active Problem List    Diagnosis Date Noted  . Chest pain 12/18/2017  . Atypical angina ([facility]) 12/13/2017  . Abnormal stress echocardiogram 12/08/2017  . Bilateral thigh pain 11/19/2016  . Goals of care, counseling/discussion 03/05/2016  . MRSA (methicillin resistant Staphylococcus aureus) infection 01/18/2016  . Anemia due to antineoplastic chemotherapy 12/11/2015  . Lymphedema of left leg 12/11/2015  . Osteopenia of multiple sites 11/26/2015  . Chemotherapy-induced peripheral neuropathy ([facility]) 11/03/2015  . Port catheter in place 09/26/2015  . Chemotherapy induced nausea and vomiting 08/13/2015  . Cancer related pain 08/13/2015  . [name], right ([facility]) 07/14/2015  . S/P TRAM (transverse rectus abdominis muscle) flap breast reconstruction 07/14/2015  . Thyroid nodule 07/09/2015  . Depression   . Generalized anxiety disorder   . Esophageal reflux   . Genetic testing 04/04/2015  . Congenital deafness 02/26/2015  . Premature surgical menopause 02/26/2015  . Benign paroxysmal positional vertigo 07/24/2014  . Migraine 07/24/2014  . DIZZINESS, CHRONIC 01/25/2007  . Insomnia 01/25/2007   Past Medical History:  Diagnosis Date  . Abnormal Pap smear   . Anxiety   . Breast cancer ([facility])   . Cancer ([facility]) 1999   breast-s/p mastectomy, chemo, rad  . CIN I (cervical intraepithelial neoplasia I) 2003   by colpo  . Congenital deafness   . Deaf   . Depression   . Port-A-Cath in  place 2017   for chemotherapy  . Radiation 07/12/15-07/26/15   left femur 30 Gy  . S/P bilateral oophorectomy     Family History  Problem Relation Age of Onset  . Hypertension Mother   . Seizures Mother   . Alzheimer's disease Maternal Uncle   . Pancreatitis Maternal Grandmother   . Heart attack Maternal Grandfather     Past Surgical History:  Procedure Laterality Date  . ABDOMINAL HYSTERECTOMY    . INTRAMEDULLARY (IM) NAIL INTERTROCHANTERIC Left 06/26/2015   Procedure: LEFT BIOMET LONG AFFIXS  NAIL;  Surgeon: [name], MD;  Location: [facility];  Service: Orthopedics;  Laterality: Left;  . IR GENERIC HISTORICAL  08/06/2015   IR US GUIDE VASC ACCESS LEFT 08/06/2015 Aletta Edouard, MD [facility]  . IR GENERIC HISTORICAL  08/06/2015   IR FLUORO GUIDE CV LINE LEFT 08/06/2015 Aletta Edouard, MD [facility]  . IR GENERIC HISTORICAL  03/05/2016   IR CV LINE INJECTION 03/05/2016 Markus Daft, MD [facility]  . LEFT HEART CATH AND CORONARY ANGIOGRAPHY N/A 12/13/2017   Procedure: LEFT HEART CATH AND CORONARY ANGIOGRAPHY;  Surgeon: Leonie Man, MD;  Location: [facility];  Service: Cardiovascular;  Laterality: N/A;  . MASTECTOMY     right breast  . MASTECTOMY    . OVARIAN CYST REMOVAL    . RADIOLOGY WITH ANESTHESIA Left 06/25/2015   Procedure: MRI OF LEFT HIP WITH OR WITHOUT CONTRAST;  Surgeon: Medication Radiologist, MD;  Location: [facility];  Service: Radiology;  Laterality: Left;  DR. MCINTYRE/MRI  . TUBAL LIGATION     Social History   Occupational History  . Not on file  Tobacco Use  . Smoking status: Never Smoker  . Smokeless tobacco: Never Used  Substance and Sexual Activity  . Alcohol use: No  . Drug use: No  . Sexual activity: Yes    Birth control/protection: Surgical

## 2020-04-25 ENCOUNTER — Other Ambulatory Visit (HOSPITAL_COMMUNITY): Payer: Self-pay

## 2020-04-25 MED FILL — Capecitabine Tab 500 MG: ORAL | 14 days supply | Qty: 28 | Fill #0 | Status: AC

## 2020-05-01 ENCOUNTER — Other Ambulatory Visit (HOSPITAL_COMMUNITY): Payer: Self-pay

## 2020-05-01 NOTE — Progress Notes (Signed)
[facility]   [phone] [phone]   Clinic Follow up Note   Patient Care Team: [name], [name], [name] as PCP - General (Physician Assistant) [name], MD as Consulting Physician (Orthopedic Surgery)  Date of Service:  05/03/2020  CHIEF COMPLAINT: f/u of metastatic breast cancer  SUMMARY OF ONCOLOGIC HISTORY: Oncology History Overview Note   Cancer Staging No matching staging information was found for the patient.     [name], right ([place])  11/1997 Cancer Diagnosis   Patient had 1.4 cm poorly differentiated right breast carcinoma diagnosed in Nov 1999 at age 35, 1/7 axillary nodes involved, ER/PR and HER 2 positive. She had mastectomy with the 7 axillary node evaluation, 4 cycles of adriamycin/ cytoxan followed by taxotere, then five years of tamoxifen thru June 2005 (no herceptin in 1999). She had bilateral oophorectomy in May 2005. She was briefly on aromatase inhibitor after tamoxifen, but discontinued this herself due to poor tolerance.   04/04/2015 Genetic Testing   Negative genetic testing on the breast/ovarian cancer pnael.  The Breast/Ovarian gene panel offered by [facility] includes sequencing and rearrangement analysis for the following 20 genes:  ATM, BARD1, BRCA1, BRCA2, BRIP1, CDH1, CHEK2, EPCAM, FANCC, MLH1, MSH2, MSH6, NBN, PALB2, PMS2, PTEN, RAD51C, RAD51D, TP53, and XRCC2.   06/26/2015 Pathology Results   Initial Path Report Bone, curettage, Left femur met, intramedullary subtroch tissue - METASTATIC ADENOCARCINOMA.  Estrogen Receptor: 95%, POSITIVE, STRONG STAINING INTENSITY Progesterone Receptor: 2%, POSITIVE, STRONG  HER2 (+), IHC 3+   06/26/2015 Surgery   Biopsy of metastatic tissue and stabilization with Affixus trochanteric nail, proximal and distal interlock of left femur by Dr. [name]    06/28/2015 Imaging   CT Chest w/ Contrast 1. Extensive right internal mammary chain  lymphadenopathy consistent with metastatic breast cancer. 2. Lytic metastases involving the posterior elements at T1 with probable nondisplaced pathologic fracture of the spinous process. 3. No other evidence of thoracic metastatic disease. 4. Trace bilateral pleural effusions with associated bibasilar atelectasis. 5. Indeterminate right thyroid nodule, not previously imaged. This could be evaluated with thyroid ultrasound as clinically warranted.   06/29/2015 -  Chemotherapy   Zometa started monthly on 06/29/15 and switched to q27month from 01/08/17   07/01/2015 Imaging   Bone Scan 1. Increased activity noted throughout the left femur. Although metastatic disease cannot be excluded, these changes are most likely secondary to prior surgery. 2. No other focal abnormalities identified to suggest metastatic disease.   07/12/2015 - 07/26/2015 Radiation Therapy   Left femur, 30 Gy in 10 fractions by Dr. [name]   07/14/2015 Initial Diagnosis   [name]   07/19/2015 Imaging   Initial PET Scan 1. Severe multifocal osseous metastatic disease, much greater than anticipated based on prior imaging. 2. Multifocal nodal metastases involving the right internal mammary, prevascular and subpectoral lymph nodes. No axillary pulmonary involvement identified. 3. No distant extra osseous metastases.   07/30/2015 - 11/29/2015 Chemotherapy   Docetaxel, Herceptin and Perjeta every 3 weeks X6 cycles, pt tolerated moderately well, restaging scan showed excellent response    09/18/2015 Imaging   CT Head w/wo Contrast 1. No acute intracranial abnormality or significant interval change. 2. Stable minimal periventricular white matter hypoattenuation on the right. This may reflect a remote ischemic injury. 3. No evidence for metastatic disease to the brain. 4. No focal soft tissue lesion to explain the patient's tenderness.   10/30/2015 Imaging  CT Abdomen Pelvis w/  Contrast 1. Few mildly prominent fluid-filled loops of small bowel scattered throughout the abdomen, with intraluminal fluid density within the distal colon. Given the provided history, findings are suggestive of acute enteritis/diarrheal illness. 2. No other acute intra-abdominal or pelvic process identified. 3. Widespread osseous metastatic disease, better evaluated on most recent PET-CT from 07/19/2015. No pathologic fracture or other complication. 4. 11 mm hypodensity within the left kidney. This lesion measures intermediate density, and is indeterminate. While this lesion is similar in size relative to recent studies, this is increased in size relative to prior study from 2012. Further evaluation with dedicated renal mass protocol CT and/or MRI is recommended for complete characterization.   12/2015 - 05/14/2016 Chemotherapy   Maintenance Herceptin and pejeta every 3 weeks stopped on 05/14/16 due to disease progression   12/25/2015 Imaging   Restaging PET Scan 1. Markedly improved skeletal activity, with only several faint foci of residual accentuated metabolic activity, but resolution of the vast majority of the previously extensive osseous metastatic disease. 2. Resolution of the prior right internal mammary and right prevascular lymph nodes. 3. Residual subcutaneous edema and edema along fascia planes in the left upper thigh. This remains somewhat more than I would expect for placement of an IM nail 6 weeks ago. If there is leg swelling further down, consider Doppler venous ultrasound to rule out left lower extremity DVT. I do not perceive an obvious difference in density between the pelvic and common femoral veins on the noncontrast CT data. 4. Chronic right maxillary and right sphenoid sinusitis.   01/16/2016 - 10/20/2017 Anti-estrogen oral therapy   Letrozole 2.5 mg daily. She was switched to exemestane due to disease progression on 02/08/17. Stopped on 10/20/2017 when treatment changed to chemo     03/01/2016 Miscellaneous   Patient presented to ED following a fall; she reports she landed on her back and is now experiencing back pain. The patient was evaluated and discharge home same day with pain control.   05/01/2016 Imaging   CT CAP IMPRESSION: 1. Stable exam.  No new or progressive disease identified. 2. No evidence for residual or recurrent adenopathy within the chest. 3. Stable bone metastasis.   05/01/2016 Imaging   BONE SCAN IMPRESSION: 1. Small focal uptake involving the anterior rib ends of the left second and right fourth ribs, new since the prior bone scan. The location of this uptake is more suggestive of a traumatic or inflammatory etiology as opposed to metastatic disease. No other evidence of metastatic disease. 2. There are other areas of stable uptake which are most likely degenerative/reactive, including right shoulder and cervical spine uptake and uptake along the right femur adjacent to the ORIF hardware.   05/20/2016 Imaging   NM PET Skull to Thigh IMPRESSION: 1. There multiple metastatic foci in the bony pelvis, including some new abnormal foci compare to the prior PET-CT, compatible with mildly progressive bony metastatic disease. Also, a left T11 vertebral hypermetabolic metastatic lesion is observed, new compared to the prior exam. 2. No active extraosseous malignancy is currently identified. 3. Right anterior fourth rib healing fracture, appears be   06/04/2016 - 01/08/2017 Chemotherapy   Second line chemotherapy Kadcyla every 3 weeks started on 06/04/16 and stopped on 01/08/17 due to mixed response.     10/05/2016 Imaging   CT CAP and Bone Scan:  Bones: left intramedullary rod and left femoral head neck screw in place. In the proximal diaphysis of the left femur there is cortical thickening and   irregularity. No lytic or blastic bone lesions. No fracture or vertebral endplate destruction.   No definite evidence of recurrent disease in the  chest, abdomen, or pelvis.   10/28/2016 Imaging   CT A/P IMPRESSION: No acute process demonstrated in the abdomen or pelvis. No evidence of bowel obstruction or inflammation.   11/04/2016 Imaging   DG foot complete left: IMPRESSION: No fracture or dislocation.  No soft tissue abnormality   11/23/2016 Imaging   CT RIGHT FEMUR IMPRESSION: Negative CT scan of the right thigh.  No visible metastatic disease.  CT LEFT FEMUR IMPRESSION: 1. Postsurgical changes related to prior cephalomedullary rod fixation of the left femur with linear lucency along the anterolateral proximal femoral cortex at level of the lesser trochanter, suspicious for nondisplaced fracture. 2. Slightly permeative appearance of the proximal left femoral cortex at the site of known prior osseous metastases is largely unchanged. 3. Unchanged patchy lucency and sclerosis along the anterior acetabulum, which appears to correspond with an area of faint uptake on the prior PET-CT, suspicious for osseous metastasis. These results will be called to the ordering clinician or representative by the Radiologist Assistant, and communication documented in the PACS or zVision Dashboard.     01/27/2017 Imaging   IMPRESSION: 1. Mixed response to osseous metastasis. Some hypermetabolic lesions are new and increasingly hypermetabolic, while another index lesion is decreasingly hypermetabolic. 2. No evidence of hypermetabolic soft tissue metastasis.    02/08/2017 - 10/19/2017 Antibody Plan   -Herceptin every 3 week since 02/08/17 -Oral Lanpantinib 1562m and decreased to 5045mdue to diarrhea and depression. Due to disease progression she swithced to Verzenio 15035mn 05/04/17. She did not toelrate well so she was switched to Ibrance 100m50m 05/31/17. Due to her neutropenia, Ibrance dose reduced to 75 mg daily, 3 weeks on, one-week off, stopped on 10/20/2017 due to disease progression.    04/30/2017 PET scan   IMPRESSION: 1.  Primarily increase in hypermetabolism of osseous metastasis. An isolated right acetabular lesion is decreased in hypermetabolism since the prior exam. 2. No evidence of hypermetabolic soft tissue metastasis. 3. Similar non FDG avid right-sided thyroid nodule, favoring a benign etiology. Recommend attention on follow-up.   08/30/2017 PET scan   08/30/2017 PET Scan  IMPRESSION: 1. Worsening osseous metastatic disease. 2. Focal hypermetabolism in the central spinal canal the L1 level, similar to the prior exam. Metastatic implant cannot be excluded. 3. Slight enlargement of a right thyroid nodule which now appears more cystic in character. Consider further evaluation with thyroid ultrasound. If patient is clinically hyperthyroid, consider nuclear medicine thyroid uptake and scan   08/30/2017 Progression   08/30/2017 PET Scan  IMPRESSION: 1. Worsening osseous metastatic disease. 2. Focal hypermetabolism in the central spinal canal the L1 level, similar to the prior exam. Metastatic implant cannot be excluded. 3. Slight enlargement of a right thyroid nodule which now appears more cystic in character. Consider further evaluation with thyroid ultrasound. If patient is clinically hyperthyroid, consider nuclear medicine thyroid uptake and scan   09/29/2017 - 10/11/2017 Radiation Therapy   Pt received 27 Gy in 9 fractions directed at the areas of significant uptake noted on recent bone scan the right pelvis and right proximal femur, with Dr, KinaSondra Come10/09/2017 - 04/07/2018 Chemotherapy   -Herceptin every 3 weeks starting 02/08/17, perjeta added on 10/20/2017  -weekly Taxol started on 10/21/2017. Will reduce Taxol to 2 weeks on, 1 week off starting on 02/04/17.  Stoped due to mild disease progression  10/27/2017 Imaging   10/27/2017 CT AP IMPRESSION: 1. No acute abnormality. No evidence of bowel obstruction or acute bowel inflammation. Normal appendix. 2. Stable patchy sclerotic osseous  metastases throughout the visualized skeleton. No new or progressive metastatic disease in the abdomen or pelvis.   01/11/2018 Imaging   Whole Body Bone Scan 01/11/18  IMPRESSION: 1. Multifocal osseous metastases as described. 2. Right scapular metastasis. 3. Right superior thoracic spinous process. 4. Right sacral ala and right L5 metastases. 5. Right acetabular metastasis. 6. Right proximal femur metastasis. 7. Diffuse metastases throughout the left femur. 8. Anterior right fourth rib metastasis.   01/11/2018 Imaging   CT CAP W Contrast 01/11/18  IMPRESSION: 1. Similar-appearing osseous metastatic disease. 2. No evidence for progressed metastatic disease in the chest, abdomen or pelvis.   03/30/2018 Imaging   Bone Scan 03/30/18  IMPRESSION: Stable multifocal bony metastatic disease.   03/30/2018 Imaging    CT CAP W Contrast 03/30/18 IMPRESSION: 1. New mild contiguous wall thickening with associated mucosal hyperenhancement throughout the left colon and rectum, suggesting nonspecific infectious or inflammatory proctocolitis. Differential includes C diff colitis. 2. Stable faint patchy sclerosis in the iliac bones and lumbar vertebral bodies. No new or progressive osseous metastatic disease by CT. Please note that the osseous lesions are relatively occult by CT and would be better evaluated by PET-CT, as clinically warranted. 3. No evidence of extra osseous metastatic disease.  These results will be called to the ordering clinician or representative by the Radiologist Assistant, and communication documented in the PACS or zVision Dashboard.   04/15/2018 - 06/16/2018 Chemotherapy   Enhertu q3weeks starting 04/15/18. Stopped Enhertu after cycle 3 on 05/27/18 due to poor toleration due to poor toleration with anorexia, weight loss, N&V, diarrhea   04/21/2018 PET scan   PET 04/21/18  IMPRESSION: 1. Overall marked interval improvement in bony hypermetabolic disease. All of the  previously identified hypermetabolic osseous metastases have decreased in hypermetabolism in the interval. There is a new tiny focus of hypermetabolism in the right aspect of the L3 vertebral body that appears to correspond to a new 8 mm lucency, raising concern for new metastatic disease. Close attention on follow-up recommended. 2. Focal hypermetabolism in the central spinal canal at the level of L1 previously has resolved. 3. No hypermetabolic soft tissue disease on today's study. 4. Right thyroid nodule seen previously has decreased in the interval.    06/16/2018 Imaging   CT LUMBAR SPINE WO CONTRAST 06/16/18 IMPRESSION: 1. Transitional lumbosacral anatomy. Correlation with radiographs is recommended prior to any operative intervention. 2. Suspicion of a right L3 vertebral body metastasis on the April PET-CT is not evident by CT. Stable degenerative versus metastatic lesion at the right L5 superior endplate. Small but increased right iliac bone metastasis since March. Lumbar MRI would best characterize lumbar metastatic disease (without contrast if necessary). 3. Suspect L2-L3 left subarticular segment disc herniation. Query left L2 radiculitis. 4. Stable appearance of rightward L4-L5 disc degeneration which could affect the right foramen and right lateral recess.   06/17/2018 - 11/16/2018 Chemotherapy   Restart Taxol 2 weeks on/1 week off with Herceptin and Perjeta q3weeks on 06/17/18. Perjeta was stopped after C5 due to diarrhea. Stopped Taxol after 11/16/18 due to disease progression to L3.    07/06/2018 PET scan   PET  IMPRESSION: 1. Near total resolution of hypermetabolic activity associated with skeletal metastasis. Very mild activity associated with the LEFT iliac bone which is similar to background blood pool activity. 2. No new   or progressive disease.   11/21/2018 PET scan   IMPRESSION: 1. New focal hypermetabolic lesion eccentric to the right in the L3 vertebral body,  maximum SUV 6.1 compatible with a new focus of active malignancy. 2. Very low-grade activity similar to blood pool in previous existing mildly sclerotic metastatic lesions including the left iliac bone lesion. Some of the mildly sclerotic lesions are not hypermetabolic compatible with effectively treated bony metastatic disease. 3. No hypermetabolic soft tissue lesions are identified.     11/30/2018 - 08/25/2019 Chemotherapy   Continue Herceptin q3weeks. Stopped 08/25/19 due to significant disease progression and switched to  IV Matuzumab   12/12/2018 - 12/17/2018 Chemotherapy   Tucatinib (TUKYSA) 6 tabs BID as a next line therapy starting 12/12/18. Stopped 12/17/18 duet o N&V, poor appetite and HA    12/21/2018 - 03/17/2019 Chemotherapy   IV Eribulin on day 1, 8 every 21 days starting 12/21/18. Stopped 03/17/19 due to mixed response to treatment.    03/21/2019 PET scan   IMPRESSION: 1. Mixed response to therapy of osseous metastasis. Although a right-sided L4 lesion demonstrates decreased hypermetabolism today, new left iliac and right ischial hypermetabolic lesions are seen. 2. No evidence of hypermetabolic soft tissue metastasis.     03/27/2019 - 07/21/2019 Chemotherapy   Neratinib (Nerlynx) titrate up from 120mg to 240mg starting 03/27/19. She could not tolerate 160mg and stopped on 02/05/19 due to N&V and diarrhea. Will restart at 120mg on 04/10/18-04/27/19 and 4/19-4/22. Reduced to 80mg once daily on 05/10/19. Held/stopped since 07/21/19 due to dry cough, Nausea and body aches.   06/23/2019 Imaging   CT Head  IMPRESSION: 1. No acute intracranial abnormality or metastatic disease identified. 2. Chronic white matter disease in the right hemisphere is stable from prior exams.     09/05/2019 PET scan   IMPRESSION: 1. Significant progression of widespread hypermetabolic osseous metastases throughout the axial skeleton as detailed. 2. No hypermetabolic extraosseous sites of metastatic disease.    09/15/2019 - 12/22/2019 Chemotherapy   -IV Margetuximab q3weeks and IV Gemcitabine 2 weeks on/1 week off starting 09/15/19. Carboplatin added to Gemcitabine starting with C3 on 11/03/19. D/c after C5 due to disease progression in bone.    12/13/2019 Imaging   PET  IMPRESSION: 1. Interval progression of widespread, FDG avid osseous metastasis involving the axial skeleton. 2. No FDG avid extra osseous sites of metastatic disease.     01/19/2020 -  Chemotherapy   -Xeloda BID 2 weeks on/1 week off and Herceptin q3weeks starting 01/19/20. C1 at 1000mg BID. C2 increased to 1500mg in the AM and 1000mg in the PM starting 02/12/20.   04/09/2020 Imaging   CT CAP  IMPRESSION: 1. New 3 mm RIGHT upper lobe nodule, nonspecific, attention on follow-up. 2. What may represent an incomplete periprosthetic fracture about the cephalo medullary rod in the LEFT proximal femur. This linear band of incomplete bony fusion that is more pronounced than on previous CT imaging that was acquired in 2017 and may represent a stress type fracture about hardware. Correlate with any pain in this area. Sclerotic margins show chronic features but the linear band passing through the bone around the intramedullary rod is more extensive, involving approximately 270 degrees of the circumference of the femur, medial cortex remains intact. 3. Other findings of bony metastatic disease are similar to prior imaging. Sclerosis and some periosteal reaction about a nondisplaced pathologic fracture of the LEFT lateral seventh rib. 4. Small hypervascular focus in the RIGHT hepatic lobe, indistinct but new compared to   previous imaging. Perhaps this represents a transient phenomenon though is not imaged on the later phase to allow for confirmation. Liver MRI may be helpful for further evaluation, to exclude underlying metastatic lesion as clinically warranted. 5. Hepatic steatosis. 6. Aortic atherosclerosis.   Aortic Atherosclerosis  (ICD10-I70.0).        CURRENT THERAPY:  -Zometa q71month -Xeloda BID 2 weeks on/1 week off and Herceptinq3weeks starting1/7/22.C1 at 10061mBID.C2 increased to 150045mn the AM and 1000m12m the PMstChamita1/22.Held since 03/22/20 due to hyperbilirubinemia. restart at 1000mg102m 7days on and 7 days off on 04/19/2020  INTERVAL HISTORY:  WendyREVIA NGHIEMere for a follow up. She was last seen by me on 04/12/20. She presents to the clinic with her interpretor.  She is overall clinically stable.  Still has mild to moderate bilateral hip pain.  She saw her orthopedic surgeon Dr. YatesLorin Mercyugust 12, 2020, surgery was not recommended for left hip fracture.  She denies any other new pain, appetite and energy level sometimes low, overall fair, she is able to function at home.  Weight is stable.  All other systems were reviewed with the patient and are negative.  MEDICAL HISTORY:  Past Medical History:  Diagnosis Date  . Abnormal Pap smear   . Anxiety   . Breast cancer (HCC) [facility] (HCC) [id number]   breast-s/p mastectomy, chemo, rad  . CIN I (cervical intraepithelial neoplasia I) 2003   by colpo  . Congenital deafness   . Deaf   . Depression   . Port-A-Cath in place 2017   for chemotherapy  . Radiation 07/12/15-07/26/15   left femur 30 Gy  . S/P bilateral oophorectomy     SURGICAL HISTORY: Past Surgical History:  Procedure Laterality Date  . ABDOMINAL HYSTERECTOMY    . INTRAMEDULLARY (IM) NAIL INTERTROCHANTERIC Left 06/26/2015   Procedure: LEFT BIOMET LONG AFFIXS NAIL;  Surgeon: Mark [name]  [facility];  Laterality: Left;  . IR GENERIC HISTORICAL  08/06/2015   IR US G[place]E VASC ACCESS LEFT 08/06/2015 [facility]  . IR GENERIC HISTORICAL  08/06/2015   IR FLUORO GUIDE CV LINE LEFT 08/06/2015 [facility]  . IR GENERIC HISTORICAL  03/05/2016   IR CV LINE INJECTION 03/05/2016 [facility]  . LEFT  HEART CATH AND CORONARY ANGIOGRAPHY N/A 12/13/2017   Procedure: LEFT HEART CATH AND CORONARY ANGIOGRAPHY;  Surgeon: HardiLeonie Man  [facility];  Service: Cardiovascular;  Laterality: N/A;  . MASTECTOMY     right breast  . MASTECTOMY    . OVARIAN CYST REMOVAL    . RADIOLOGY WITH ANESTHESIA Left 06/25/2015   Procedure: MRI OF LEFT HIP WITH OR WITHOUT CONTRAST;  Surgeon: Medication Radiologist, MD;  [facility];  Laterality: Left;  DR. MCINTYRE/MRI  . TUBAL LIGATION      I have reviewed the social history and family history with the patient and they are unchanged from previous note.  ALLERGIES:  is allergic to promethazine hcl, chlorhexidine, and diphenhydramine hcl.  MEDICATIONS:  Current Outpatient Medications  Medication Sig Dispense Refill  . ALPRAZolam (XANAX) 0.25 MG tablet Take 1 tablet (0.25 mg total) by mouth 2 (two) times daily as needed for anxiety. 30 tablet 0  . capecitabine (XELODA) 500 MG tablet TAKE 2 TABS IN MORNING, AND 2 TABS IN EVENING. TAKE FOR 7 DAYS ON, 7 DAYS  OFF. REPEAT EVERY 14 DAYS. START ON 04/19/2020 56 tablet 1  . diphenoxylate-atropine (LOMOTIL) 2.5-0.025 MG tablet Take 1-2 tablets by mouth 4 (four) times daily as needed for diarrhea or loose stools. 60 tablet 1  . lidocaine-prilocaine (EMLA) cream Apply 1 application topically as needed. Apply to Porta-Cath 1-2 hours prior to access as directed. 90 g 2  . ondansetron (ZOFRAN) 8 MG tablet Take 1 tablet (8 mg total) by mouth every 8 (eight) hours as needed for nausea or vomiting. 45 tablet 1  . oxyCODONE (ROXICODONE) 5 MG immediate release tablet Take 1 tablet (5 mg total) by mouth every 8 (eight) hours as needed for severe pain. Refill on 02/17/2020 90 tablet 0  . potassium chloride (KLOR-CON) 10 MEQ tablet Take 3 tablets (30 mEq total) by mouth daily. 90 tablet 2  . prochlorperazine (COMPAZINE) 10 MG tablet Take 1 tablet (10 mg total) by mouth 2 (two) times daily as  needed for nausea or vomiting. 20 tablet 1  . zolpidem (AMBIEN) 10 MG tablet Take 1 tablet (10 mg total) by mouth at bedtime as needed for sleep. 15 tablet 2   No current facility-administered medications for this visit.   Facility-Administered Medications Ordered in Other Visits  Medication Dose Route Frequency Provider Last Rate Last Admin  . potassium chloride SA (K-DUR) CR tablet 40 mEq  40 mEq Oral Once Truitt Merle, MD      . sodium chloride 0.9 % 1,000 mL with potassium chloride 10 mEq infusion   Intravenous Continuous Gordy Levan, MD   Stopped at 09/10/15 1653  . sodium chloride 0.9 % injection 10 mL  10 mL Intravenous PRN Livesay, Lennis P, MD      . trastuzumab-dkst (OGIVRI) 252 mg in sodium chloride 0.9 % 250 mL chemo infusion  4 mg/kg (Treatment Plan Recorded) Intravenous Once Truitt Merle, MD        PHYSICAL EXAMINATION: ECOG PERFORMANCE STATUS: 2 - Symptomatic, <50% confined to bed  Vitals:   05/03/20 0824  BP: 121/73  Pulse: 68  Resp: 18  Temp: (!) 96.6 F (35.9 C)  SpO2: 99%   Filed Weights   05/03/20 0824  Weight: 135 lb 8 oz (61.5 kg)    GENERAL:alert, no distress and comfortable SKIN: skin color, texture, turgor are normal, no rashes or significant lesions EYES: normal, Conjunctiva are pink and non-injected, sclera clear NECK: supple, thyroid normal size, non-tender, without nodularity LYMPH:  no palpable lymphadenopathy in the cervical, axillary  LUNGS: clear to auscultation and percussion with normal breathing effort HEART: regular rate & rhythm and no murmurs and no lower extremity edema ABDOMEN:abdomen soft, non-tender and normal bowel sounds Musculoskeletal:no cyanosis of digits and no clubbing  NEURO: alert & oriented x 3 with fluent speech, no focal motor/sensory deficits  LABORATORY DATA:  I have reviewed the data as listed CBC Latest Ref Rng & Units 05/03/2020 04/12/2020 04/01/2020  WBC 4.0 - 10.5 K/uL 3.9(L) 3.9(L) 3.6(L)  Hemoglobin 12.0 - 15.0  g/dL 11.0(L) 11.2(L) 10.6(L)  Hematocrit 36.0 - 46.0 % 31.9(L) 32.0(L) 30.5(L)  Platelets 150 - 400 K/uL 167 199 135(L)     CMP Latest Ref Rng & Units 05/03/2020 04/12/2020 04/01/2020  Glucose 70 - 99 mg/dL 88 84 86  BUN 6 - 20 mg/dL _0 Creatinine 0.44 - 1.00 mg/dL 0.88 0.91 0.94  Sodium 135 - 145 mmol/L 143 141 140  Potassium 3.5 - 5.1 mmol/L 3.3(L) 3.6 3.0(L)  Chloride 98 - 111 mmol/L 107 105  106  CO2 22 - 32 mmol/L _0 Calcium 8.9 - 10.3 mg/dL 8.7(L) 8.6(L) 8.9  Total Protein 6.5 - 8.1 g/dL 6.4(L) 6.8 6.4(L)  Total Bilirubin 0.3 - 1.2 mg/dL 2.6(H) 2.5(H) 3.7(HH)  Alkaline Phos 38 - 126 U/L 63 76 79  AST 15 - 41 U/L _1 ALT 0 - 44 U/L _2 RADIOGRAPHIC STUDIES: I have personally reviewed the radiological images as listed and agreed with the findings in the report. No results found.   ASSESSMENT & PLAN:  [name] is a 55 y.o. 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 She had right mastectomy, 4 cycles of AC-T and 5 years of Tamoxifen/AI.  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 -After her first line ofchemotreatment and initial maintenance therapy, she progressed or had poor toleration of multiple lines of treatment since then. -Based on 12/13/19 PET, she progressed diffusely in the bone on7th linetherapywith anti-HER2IV Margetuximabq3weekswithIV Chemo weeklyGemcitabine(Carboplatin added C3)2 weeks on/1 week offafter 5 cycles,09/15/19-12/22/19. -Ipreviouslydiscussed the longer she is on treatment the less treatment options and efficacy she will have. -Istarted her onXeloda 1016m BID 2 weeks on/1 week off for first cycle on 01/19/20. If tolerating well will increase.Continue Herceptin q3weeks.Increased to15076min the AM and100022m the PMsRancho Mesa VerdeSince March she has had  worsened hyperbilirubinemia. We have held Xeloda since 04/01/20.  -Her CT CAP from 04/09/20 shows stable bone mets, a small and indeterminate lesion in the liver, possible fracture around pin of left hip from prior surgery. I reviewed scan images in person and discussed with patient today. I recommend liver MRI for further evaluation, she declined due to claustrophobia. -She will proceed with bone scan on 04/16/20.  -Her clinical jaundice has improved on exam today (04/12/20). -Labs reviewed, WBC 3.9, Hg 11.2. Overall adequate to proceed with Herceptin today.  -Her liver function has slightly improved, I restarted her Xarelto at a reduced dose and frequency. -She is clinically stable, lab reviewed, total bilirubin 2.5, stable, will proceed Herceptin and Xeloda 1000 mg twice daily, 7 days on and 7 days off today -f/u in 2 weeks   2.Body pain, secondary to bone mets, Hypocalcemia, leg cramps  -She is being treated with Zometa q3mo48monthShe will continue oral calcium and Vit Dand Prilosec for Acid Reflux. -Current pain includes, lower back, upper legs, left hip and rightshoulder.PET12/3/21does indicate disease progression in her spine, pelvis and right scapula.She also has headaches occasionally. -Painhas been improving lately. She uses5mg 34mcodoneas needed, not daily. -After fall in early March she has right hip and side pain. Her left hip and leg pain is stable.  -Her 03/20/20 CT CAP shows possible fracture around pin of left hip from prior surgery.  She was evaluated by orthopedic surgeon Dr. YatesLorin Mercy recommended conservative measurements  3.Congenital deafness  4.Mild anemia, secondary to chemotherapy and malignancy -Moderate andStable   5.Hypokalemia -She developed diarrhea s/p cycle 3 Perjetaand again with Nerlynx, Both have been D/c. -She is onOral potassium 10meq75mtabs daily. I encouraged her to increase potassium in diet.  6.Goal of care discussion,  DNR/DNI -Wepreviouslyreviewed goal of care andI recommend DNR/DNI, shepreviouslyagreed.  -I encouraged her to start setting up her living will.We previously discussed hospice care if she has no more treatment available or could not tolerate more treatment  7.Insomnia -She  cannot take Benadryl due to allergy reaction. She has tried trazodone with no relief.  -She is on Xanax andonAmbien26mat half tabletsnightly. She knows not to take both at the same time. If she needs Oxycodone at night, she can use half tabletonly.  -Stable.  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 Continue monitoring. Stable.  970 Mildhyperbilirubinemia  -likely second to chemotherapy, fluctuates -Worsened on 03/22/20 with total bilirubin 3.2. Increased again to 3.7 on 04/01/20. Xeloda has been held 03/22/20 -There is subtle change in liver on her 04/09/20 CT CAP, but she declined MRI Liver for further evaluation.  -Her clinical jaundice has improved on exam today (04/12/20).  -Liver function stable since I restarted Xeloda with dose reduction    PLAN: -Labs reviewed,will proceed withHerceptin today at 459mkg and continue every 2 weeks. -next cycle Xeloda at 1000 mg twice daily, 7 days on and 7 days off, starting today  -Lab, flush, follow-up and Herceptin in 2 weeks   No problem-specific Assessment & Plan notes found for this encounter.   No orders of the defined types were placed in this encounter.  All questions were answered. The patient knows to call the clinic with any problems, questions or concerns. No barriers to learning was detected. The total time spent in the appointment was 30 minutes.     YaTruitt MerleMD 05/03/2020   I, AmJoslyn Devonam acting as scribe for YaTruitt MerleMD.   I have reviewed the above documentation for accuracy and completeness, and I agree with the above.

## 2020-05-03 ENCOUNTER — Inpatient Hospital Stay: Payer: Medicaid Other

## 2020-05-03 ENCOUNTER — Other Ambulatory Visit: Payer: Self-pay

## 2020-05-03 ENCOUNTER — Other Ambulatory Visit: Payer: Medicaid Other

## 2020-05-03 ENCOUNTER — Inpatient Hospital Stay (HOSPITAL_BASED_OUTPATIENT_CLINIC_OR_DEPARTMENT_OTHER): Payer: Medicaid Other | Admitting: Hematology

## 2020-05-03 ENCOUNTER — Encounter: Payer: Self-pay | Admitting: Hematology

## 2020-05-03 VITALS — BP 121/73 | HR 68 | Temp 96.6°F | Resp 18 | Ht 65.0 in | Wt 135.5 lb

## 2020-05-03 DIAGNOSIS — C50911 Malignant neoplasm of unspecified site of right female breast: Secondary | ICD-10-CM

## 2020-05-03 DIAGNOSIS — G4700 Insomnia, unspecified: Secondary | ICD-10-CM | POA: Diagnosis not present

## 2020-05-03 DIAGNOSIS — C7951 Secondary malignant neoplasm of bone: Secondary | ICD-10-CM

## 2020-05-03 DIAGNOSIS — Z95828 Presence of other vascular implants and grafts: Secondary | ICD-10-CM

## 2020-05-03 DIAGNOSIS — Z5112 Encounter for antineoplastic immunotherapy: Secondary | ICD-10-CM | POA: Diagnosis not present

## 2020-05-03 LAB — CMP (CANCER CENTER ONLY)
ALT: 9 U/L (ref 0–44)
AST: 15 U/L (ref 15–41)
Albumin: 3.8 g/dL (ref 3.5–5.0)
Alkaline Phosphatase: 63 U/L (ref 38–126)
Anion gap: 11 (ref 5–15)
BUN: 9 mg/dL (ref 6–20)
CO2: 25 mmol/L (ref 22–32)
Calcium: 8.7 mg/dL — ABNORMAL LOW (ref 8.9–10.3)
Chloride: 107 mmol/L (ref 98–111)
Creatinine: 0.88 mg/dL (ref 0.44–1.00)
GFR, Estimated: >60 mL/min (ref >60)
Glucose, Bld: 88 mg/dL (ref 70–99)
Potassium: 3.3 mmol/L — ABNORMAL LOW (ref 3.5–5.1)
Sodium: 143 mmol/L (ref 135–145)
Total Bilirubin: 2.6 mg/dL — ABNORMAL HIGH (ref 0.3–1.2)
Total Protein: 6.4 g/dL — ABNORMAL LOW (ref 6.5–8.1)

## 2020-05-03 LAB — CBC WITH DIFFERENTIAL (CANCER CENTER ONLY)
Abs Immature Granulocytes: 0.01 10*3/uL (ref 0.00–0.07)
Basophils Absolute: 0 10*3/uL (ref 0.0–0.1)
Basophils Relative: 0 %
Eosinophils Absolute: 0.1 10*3/uL (ref 0.0–0.5)
Eosinophils Relative: 3 %
HCT: 31.9 % — ABNORMAL LOW (ref 36.0–46.0)
Hemoglobin: 11 g/dL — ABNORMAL LOW (ref 12.0–15.0)
Immature Granulocytes: 0 %
Lymphocytes Relative: 35 %
Lymphs Abs: 1.4 10*3/uL (ref 0.7–4.0)
MCH: 33.2 pg (ref 26.0–34.0)
MCHC: 34.5 g/dL (ref 30.0–36.0)
MCV: 96.4 fL (ref 80.0–100.0)
Monocytes Absolute: 0.4 10*3/uL (ref 0.1–1.0)
Monocytes Relative: 9 %
Neutro Abs: 2.1 10*3/uL (ref 1.7–7.7)
Neutrophils Relative %: 53 %
Platelet Count: 167 10*3/uL (ref 150–400)
RBC: 3.31 MIL/uL — ABNORMAL LOW (ref 3.87–5.11)
RDW: 13.3 % (ref 11.5–15.5)
WBC Count: 3.9 10*3/uL — ABNORMAL LOW (ref 4.0–10.5)
nRBC: 0 % (ref 0.0–0.2)

## 2020-05-03 MED ORDER — SODIUM CHLORIDE 0.9 % IV SOLN
Freq: Once | INTRAVENOUS | Status: AC
  Filled 2020-05-03: qty 250

## 2020-05-03 MED ORDER — ACETAMINOPHEN 325 MG PO TABS
650.0000 mg | ORAL_TABLET | Freq: Once | ORAL | Status: AC
  Administered 2020-05-03: 650 mg via ORAL

## 2020-05-03 MED ORDER — SODIUM CHLORIDE 0.9% FLUSH
10.0000 mL | Freq: Once | INTRAVENOUS | Status: AC
  Administered 2020-05-03: 10 mL
  Filled 2020-05-03: qty 10

## 2020-05-03 MED ORDER — LORATADINE 10 MG PO TABS
10.0000 mg | ORAL_TABLET | Freq: Once | ORAL | Status: AC
  Administered 2020-05-03: 10 mg via ORAL

## 2020-05-03 MED ORDER — LORATADINE 10 MG PO TABS
ORAL_TABLET | ORAL | Status: AC
  Filled 2020-05-03: qty 1

## 2020-05-03 MED ORDER — TRASTUZUMAB-DKST CHEMO 150 MG IV SOLR
4.0000 mg/kg | Freq: Once | INTRAVENOUS | Status: AC
  Administered 2020-05-03: 252 mg via INTRAVENOUS
  Filled 2020-05-03: qty 12

## 2020-05-03 NOTE — Patient Instructions (Signed)
[facility] Discharge Instructions for Patients Receiving Chemotherapy  Today you received the following chemotherapy agents: Trastuzumab (Herceptin)  To help prevent nausea and vomiting after your treatment, we encourage you to take your nausea medication  as prescribed.    If you develop nausea and vomiting that is not controlled by your nausea medication, call the clinic.   BELOW ARE SYMPTOMS THAT SHOULD BE REPORTED IMMEDIATELY:  *FEVER GREATER THAN 100.5 F  *CHILLS WITH OR WITHOUT FEVER  NAUSEA AND VOMITING THAT IS NOT CONTROLLED WITH YOUR NAUSEA MEDICATION  *UNUSUAL SHORTNESS OF BREATH  *UNUSUAL BRUISING OR BLEEDING  TENDERNESS IN MOUTH AND THROAT WITH OR WITHOUT PRESENCE OF ULCERS  *URINARY PROBLEMS  *BOWEL PROBLEMS  UNUSUAL RASH Items with * indicate a potential emergency and should be followed up as soon as possible.  Feel free to call the clinic should you have any questions or concerns. The clinic phone number is [phone].  Please show the [facility] at check-in to the Emergency Department and triage nurse.  Zoledronic Acid Injection (Hypercalcemia, Oncology) What is this medicine? ZOLEDRONIC ACID (ZOE le dron ik AS id) slows calcium loss from bones. It high calcium levels in the blood from some kinds of cancer. It may be used in other people at risk for bone loss. This medicine may be used for other purposes; ask your health care provider or pharmacist if you have questions. COMMON BRAND NAME(S): Zometa What should I tell my health care provider before I take this medicine? They need to know if you have any of these conditions:  cancer  dehydration  dental disease  kidney disease  liver disease  low levels of calcium in the blood  lung or breathing disease (asthma)  receiving steroids like dexamethasone or prednisone  an unusual or allergic reaction to zoledronic acid, other medicines, foods, dyes, or  preservatives  pregnant or trying to get pregnant  breast-feeding How should I use this medicine? This drug is injected into a vein. It is given by a health care provider in a hospital or clinic setting. Talk to your health care provider about the use of this drug in children. Special care may be needed. Overdosage: If you think you have taken too much of this medicine contact a poison control center or emergency room at once. NOTE: This medicine is only for you. Do not share this medicine with others. What if I miss a dose? Keep appointments for follow-up doses. It is important not to miss your dose. Call your health care provider if you are unable to keep an appointment. What may interact with this medicine?  certain antibiotics given by injection  NSAIDs, medicines for pain and inflammation, like ibuprofen or naproxen  some diuretics like bumetanide, furosemide  teriparatide  thalidomide This list may not describe all possible interactions. Give your health care provider a list of all the medicines, herbs, non-prescription drugs, or dietary supplements you use. Also tell them if you smoke, drink alcohol, or use illegal drugs. Some items may interact with your medicine. What should I watch for while using this medicine? Visit your health care provider for regular checks on your progress. It may be some time before you see the benefit from this drug. Some people who take this drug have severe bone, joint, or muscle pain. This drug may also increase your risk for jaw problems or a broken thigh bone. Tell your health care provider right away if you have severe pain in your  jaw, bones, joints, or muscles. Tell you health care provider if you have any pain that does not go away or that gets worse. Tell your dentist and dental surgeon that you are taking this drug. You should not have major dental surgery while on this drug. See your dentist to have a dental exam and fix any dental problems  before starting this drug. Take good care of your teeth while on this drug. Make sure you see your dentist for regular follow-up appointments. You should make sure you get enough calcium and vitamin D while you are taking this drug. Discuss the foods you eat and the vitamins you take with your health care provider. Check with your health care provider if you have severe diarrhea, nausea, and vomiting, or if you sweat a lot. The loss of too much body fluid may make it dangerous for you to take this drug. You may need blood work done while you are taking this drug. Do not become pregnant while taking this drug. Women should inform their health care provider if they wish to become pregnant or think they might be pregnant. There is potential for serious harm to an unborn child. Talk to your health care provider for more information. What side effects may I notice from receiving this medicine? Side effects that you should report to your doctor or health care provider as soon as possible:  allergic reactions (skin rash, itching or hives; swelling of the face, lips, or tongue)  bone pain  infection (fever, chills, cough, sore throat, pain or trouble passing urine)  jaw pain, especially after dental work  joint pain  kidney injury (trouble passing urine or change in the amount of urine)  low blood pressure (dizziness; feeling faint or lightheaded, falls; unusually weak or tired)  low calcium levels (fast heartbeat; muscle cramps or pain; pain, tingling, or numbness in the hands or feet; seizures)  low magnesium levels (fast, irregular heartbeat; muscle cramp or pain; muscle weakness; tremors; seizures)  low red blood cell counts (trouble breathing; feeling faint; lightheaded, falls; unusually weak or tired)  muscle pain  redness, blistering, peeling, or loosening of the skin, including inside the mouth  severe diarrhea  swelling of the ankles, feet, hands  trouble breathing Side effects  that usually do not require medical attention (report to your doctor or health care provider if they continue or are bothersome):  anxious  constipation  coughing  depressed mood  eye irritation, itching, or pain  fever  general ill feeling or flu-like symptoms  nausea  pain, redness, or irritation at site where injected  trouble sleeping This list may not describe all possible side effects. Call your doctor for medical advice about side effects. You may report side effects to FDA at 1-800-FDA-1088. Where should I keep my medicine? This drug is given in a hospital or clinic. It will not be stored at home. NOTE: This sheet is a summary. It may not cover all possible information. If you have questions about this medicine, talk to your doctor, pharmacist, or health care provider.  2021 Elsevier/Gold Standard (2018-10-13 09:13:00)

## 2020-05-03 NOTE — Patient Instructions (Signed)
Implanted Port Insertion, Care After This sheet gives you information about how to care for yourself after your procedure. Your health care provider may also give you more specific instructions. If you have problems or questions, contact your health care provider. What can I expect after the procedure? After the procedure, it is common to have:  Discomfort at the port insertion site.  Bruising on the skin over the port. This should improve over 3-4 days. Follow these instructions at home: Port care  After your port is placed, you will get a manufacturer's information card. The card has information about your port. Keep this card with you at all times.  Take care of the port as told by your health care provider. Ask your health care provider if you or a family member can get training for taking care of the port at home. A home health care nurse may also take care of the port.  Make sure to remember what type of port you have. Incision care  Follow instructions from your health care provider about how to take care of your port insertion site. Make sure you: ? Wash your hands with soap and water before and after you change your bandage (dressing). If soap and water are not available, use hand sanitizer. ? Change your dressing as told by your health care provider. ? Leave stitches (sutures), skin glue, or adhesive strips in place. These skin closures may need to stay in place for 2 weeks or longer. If adhesive strip edges start to loosen and curl up, you may trim the loose edges. Do not remove adhesive strips completely unless your health care provider tells you to do that.  Check your port insertion site every day for signs of infection. Check for: ? Redness, swelling, or pain. ? Fluid or blood. ? Warmth. ? Pus or a bad smell.      Activity  Return to your normal activities as told by your health care provider. Ask your health care provider what activities are safe for you.  Do not  lift anything that is heavier than 10 lb (4.5 kg), or the limit that you are told, until your health care provider says that it is safe. General instructions  Take over-the-counter and prescription medicines only as told by your health care provider.  Do not take baths, swim, or use a hot tub until your health care provider approves. Ask your health care provider if you may take showers. You may only be allowed to take sponge baths.  Do not drive for 24 hours if you were given a sedative during your procedure.  Wear a medical alert bracelet in case of an emergency. This will tell any health care providers that you have a port.  Keep all follow-up visits as told by your health care provider. This is important. Contact a health care provider if:  You cannot flush your port with saline as directed, or you cannot draw blood from the port.  You have a fever or chills.  You have redness, swelling, or pain around your port insertion site.  You have fluid or blood coming from your port insertion site.  Your port insertion site feels warm to the touch.  You have pus or a bad smell coming from the port insertion site. Get help right away if:  You have chest pain or shortness of breath.  You have bleeding from your port that you cannot control. Summary  Take care of the port as told by your   health care provider. Keep the manufacturer's information card with you at all times.  Change your dressing as told by your health care provider.  Contact a health care provider if you have a fever or chills or if you have redness, swelling, or pain around your port insertion site.  Keep all follow-up visits as told by your health care provider. This information is not intended to replace advice given to you by your health care provider. Make sure you discuss any questions you have with your health care provider. Document Revised: 07/27/2017 Document Reviewed: 07/27/2017 Elsevier Patient Education   2021 [employer].  

## 2020-05-04 LAB — CANCER ANTIGEN 27.29: CA 27.29: 78.6 U/mL — ABNORMAL HIGH (ref 0.0–38.6)

## 2020-05-13 NOTE — Progress Notes (Signed)
[facility]   [phone] [phone]   Clinic Follow up Note   Patient Care Team: [name], [name], [name] as PCP - General (Physician Assistant) Eldred Manges, MD as Consulting Physician (Orthopedic Surgery)  Date of Service:  05/17/2020  CHIEF COMPLAINT: F/u of metastatic breast cancer  SUMMARY OF ONCOLOGIC HISTORY: Oncology History Overview Note   Cancer Staging No matching staging information was found for the patient.     [name], right (HCC)  11/1997 Cancer Diagnosis   Patient had 1.4 cm poorly differentiated right breast carcinoma diagnosed in Nov 1999 at age 55, 1/7 axillary nodes involved, ER/PR and HER 2 positive. She had mastectomy with the 7 axillary node evaluation, 4 cycles of adriamycin/ cytoxan followed by taxotere, then five years of tamoxifen thru June 2005 (no herceptin in 1999). She had bilateral oophorectomy in May 2005. She was briefly on aromatase inhibitor after tamoxifen, but discontinued this herself due to poor tolerance.   04/04/2015 Genetic Testing   Negative genetic testing on the breast/ovarian cancer pnael.  The Breast/Ovarian gene panel offered by [facility] includes sequencing and rearrangement analysis for the following 20 genes:  ATM, BARD1, BRCA1, BRCA2, BRIP1, CDH1, CHEK2, EPCAM, FANCC, MLH1, MSH2, MSH6, NBN, PALB2, PMS2, PTEN, RAD51C, RAD51D, TP53, and XRCC2.   06/26/2015 Pathology Results   Initial Path Report Bone, curettage, Left femur met, intramedullary subtroch tissue - METASTATIC ADENOCARCINOMA.  Estrogen Receptor: 95%, POSITIVE, STRONG STAINING INTENSITY Progesterone Receptor: 2%, POSITIVE, STRONG  HER2 (+), IHC 3+   06/26/2015 Surgery   Biopsy of metastatic tissue and stabilization with Affixus trochanteric nail, proximal and distal interlock of left femur by Dr. Ophelia Charter    06/28/2015 Imaging   CT Chest w/ Contrast 1. Extensive right internal mammary chain  lymphadenopathy consistent with metastatic breast cancer. 2. Lytic metastases involving the posterior elements at T1 with probable nondisplaced pathologic fracture of the spinous process. 3. No other evidence of thoracic metastatic disease. 4. Trace bilateral pleural effusions with associated bibasilar atelectasis. 5. Indeterminate right thyroid nodule, not previously imaged. This could be evaluated with thyroid ultrasound as clinically warranted.   06/29/2015 -  Chemotherapy   Zometa started monthly on 06/29/15 and switched to q3months from 01/08/17   07/01/2015 Imaging   Bone Scan 1. Increased activity noted throughout the left femur. Although metastatic disease cannot be excluded, these changes are most likely secondary to prior surgery. 2. No other focal abnormalities identified to suggest metastatic disease.   07/12/2015 - 07/26/2015 Radiation Therapy   Left femur, 30 Gy in 10 fractions by Dr. Roselind Messier    07/14/2015 Initial Diagnosis   [name], right (HCC)   07/19/2015 Imaging   Initial PET Scan 1. Severe multifocal osseous metastatic disease, much greater than anticipated based on prior imaging. 2. Multifocal nodal metastases involving the right internal mammary, prevascular and subpectoral lymph nodes. No axillary pulmonary involvement identified. 3. No distant extra osseous metastases.   07/30/2015 - 11/29/2015 Chemotherapy   Docetaxel, Herceptin and Perjeta every 3 weeks X6 cycles, pt tolerated moderately well, restaging scan showed excellent response    09/18/2015 Imaging   CT Head w/wo Contrast 1. No acute intracranial abnormality or significant interval change. 2. Stable minimal periventricular white matter hypoattenuation on the right. This may reflect a remote ischemic injury. 3. No evidence for metastatic disease to the brain. 4. No focal soft tissue lesion to explain the patient's tenderness.   10/30/2015 Imaging  CT Abdomen Pelvis w/  Contrast 1. Few mildly prominent fluid-filled loops of small bowel scattered throughout the abdomen, with intraluminal fluid density within the distal colon. Given the provided history, findings are suggestive of acute enteritis/diarrheal illness. 2. No other acute intra-abdominal or pelvic process identified. 3. Widespread osseous metastatic disease, better evaluated on most recent PET-CT from 07/19/2015. No pathologic fracture or other complication. 4. 11 mm hypodensity within the left kidney. This lesion measures intermediate density, and is indeterminate. While this lesion is similar in size relative to recent studies, this is increased in size relative to prior study from 2012. Further evaluation with dedicated renal mass protocol CT and/or MRI is recommended for complete characterization.   12/2015 - 05/14/2016 Chemotherapy   Maintenance Herceptin and pejeta every 3 weeks stopped on 05/14/16 due to disease progression   12/25/2015 Imaging   Restaging PET Scan 1. Markedly improved skeletal activity, with only several faint foci of residual accentuated metabolic activity, but resolution of the vast majority of the previously extensive osseous metastatic disease. 2. Resolution of the prior right internal mammary and right prevascular lymph nodes. 3. Residual subcutaneous edema and edema along fascia planes in the left upper thigh. This remains somewhat more than I would expect for placement of an IM nail 6 weeks ago. If there is leg swelling further down, consider Doppler venous ultrasound to rule out left lower extremity DVT. I do not perceive an obvious difference in density between the pelvic and common femoral veins on the noncontrast CT data. 4. Chronic right maxillary and right sphenoid sinusitis.   01/16/2016 - 10/20/2017 Anti-estrogen oral therapy   Letrozole 2.5 mg daily. She was switched to exemestane due to disease progression on 02/08/17. Stopped on 10/20/2017 when treatment changed to chemo     03/01/2016 Miscellaneous   Patient presented to ED following a fall; she reports she landed on her back and is now experiencing back pain. The patient was evaluated and discharge home same day with pain control.   05/01/2016 Imaging   CT CAP IMPRESSION: 1. Stable exam.  No new or progressive disease identified. 2. No evidence for residual or recurrent adenopathy within the chest. 3. Stable bone metastasis.   05/01/2016 Imaging   BONE SCAN IMPRESSION: 1. Small focal uptake involving the anterior rib ends of the left second and right fourth ribs, new since the prior bone scan. The location of this uptake is more suggestive of a traumatic or inflammatory etiology as opposed to metastatic disease. No other evidence of metastatic disease. 2. There are other areas of stable uptake which are most likely degenerative/reactive, including right shoulder and cervical spine uptake and uptake along the right femur adjacent to the ORIF hardware.   05/20/2016 Imaging   NM PET Skull to Thigh IMPRESSION: 1. There multiple metastatic foci in the bony pelvis, including some new abnormal foci compare to the prior PET-CT, compatible with mildly progressive bony metastatic disease. Also, a left T11 vertebral hypermetabolic metastatic lesion is observed, new compared to the prior exam. 2. No active extraosseous malignancy is currently identified. 3. Right anterior fourth rib healing fracture, appears be   06/04/2016 - 01/08/2017 Chemotherapy   Second line chemotherapy Kadcyla every 3 weeks started on 06/04/16 and stopped on 01/08/17 due to mixed response.     10/05/2016 Imaging   CT CAP and Bone Scan:  Bones: left intramedullary rod and left femoral head neck screw in place. In the proximal diaphysis of the left femur there is cortical thickening and   irregularity. No lytic or blastic bone lesions. No fracture or vertebral endplate destruction.   No definite evidence of recurrent disease in the  chest, abdomen, or pelvis.   10/28/2016 Imaging   CT A/P IMPRESSION: No acute process demonstrated in the abdomen or pelvis. No evidence of bowel obstruction or inflammation.   11/04/2016 Imaging   DG foot complete left: IMPRESSION: No fracture or dislocation.  No soft tissue abnormality   11/23/2016 Imaging   CT RIGHT FEMUR IMPRESSION: Negative CT scan of the right thigh.  No visible metastatic disease.  CT LEFT FEMUR IMPRESSION: 1. Postsurgical changes related to prior cephalomedullary rod fixation of the left femur with linear lucency along the anterolateral proximal femoral cortex at level of the lesser trochanter, suspicious for nondisplaced fracture. 2. Slightly permeative appearance of the proximal left femoral cortex at the site of known prior osseous metastases is largely unchanged. 3. Unchanged patchy lucency and sclerosis along the anterior acetabulum, which appears to correspond with an area of faint uptake on the prior PET-CT, suspicious for osseous metastasis. These results will be called to the ordering clinician or representative by the Radiologist Assistant, and communication documented in the PACS or zVision Dashboard.     01/27/2017 Imaging   IMPRESSION: 1. Mixed response to osseous metastasis. Some hypermetabolic lesions are new and increasingly hypermetabolic, while another index lesion is decreasingly hypermetabolic. 2. No evidence of hypermetabolic soft tissue metastasis.    02/08/2017 - 10/19/2017 Antibody Plan   -Herceptin every 3 week since 02/08/17 -Oral Lanpantinib 1562m and decreased to 5045mdue to diarrhea and depression. Due to disease progression she swithced to Verzenio 15035mn 05/04/17. She did not toelrate well so she was switched to Ibrance 100m50m 05/31/17. Due to her neutropenia, Ibrance dose reduced to 75 mg daily, 3 weeks on, one-week off, stopped on 10/20/2017 due to disease progression.    04/30/2017 PET scan   IMPRESSION: 1.  Primarily increase in hypermetabolism of osseous metastasis. An isolated right acetabular lesion is decreased in hypermetabolism since the prior exam. 2. No evidence of hypermetabolic soft tissue metastasis. 3. Similar non FDG avid right-sided thyroid nodule, favoring a benign etiology. Recommend attention on follow-up.   08/30/2017 PET scan   08/30/2017 PET Scan  IMPRESSION: 1. Worsening osseous metastatic disease. 2. Focal hypermetabolism in the central spinal canal the L1 level, similar to the prior exam. Metastatic implant cannot be excluded. 3. Slight enlargement of a right thyroid nodule which now appears more cystic in character. Consider further evaluation with thyroid ultrasound. If patient is clinically hyperthyroid, consider nuclear medicine thyroid uptake and scan   08/30/2017 Progression   08/30/2017 PET Scan  IMPRESSION: 1. Worsening osseous metastatic disease. 2. Focal hypermetabolism in the central spinal canal the L1 level, similar to the prior exam. Metastatic implant cannot be excluded. 3. Slight enlargement of a right thyroid nodule which now appears more cystic in character. Consider further evaluation with thyroid ultrasound. If patient is clinically hyperthyroid, consider nuclear medicine thyroid uptake and scan   09/29/2017 - 10/11/2017 Radiation Therapy   Pt received 27 Gy in 9 fractions directed at the areas of significant uptake noted on recent bone scan the right pelvis and right proximal femur, with Dr, KinaSondra Come10/09/2017 - 04/07/2018 Chemotherapy   -Herceptin every 3 weeks starting 02/08/17, perjeta added on 10/20/2017  -weekly Taxol started on 10/21/2017. Will reduce Taxol to 2 weeks on, 1 week off starting on 02/04/17.  Stoped due to mild disease progression  10/27/2017 Imaging   10/27/2017 CT AP IMPRESSION: 1. No acute abnormality. No evidence of bowel obstruction or acute bowel inflammation. Normal appendix. 2. Stable patchy sclerotic osseous  metastases throughout the visualized skeleton. No new or progressive metastatic disease in the abdomen or pelvis.   01/11/2018 Imaging   Whole Body Bone Scan 01/11/18  IMPRESSION: 1. Multifocal osseous metastases as described. 2. Right scapular metastasis. 3. Right superior thoracic spinous process. 4. Right sacral ala and right L5 metastases. 5. Right acetabular metastasis. 6. Right proximal femur metastasis. 7. Diffuse metastases throughout the left femur. 8. Anterior right fourth rib metastasis.   01/11/2018 Imaging   CT CAP W Contrast 01/11/18  IMPRESSION: 1. Similar-appearing osseous metastatic disease. 2. No evidence for progressed metastatic disease in the chest, abdomen or pelvis.   03/30/2018 Imaging   Bone Scan 03/30/18  IMPRESSION: Stable multifocal bony metastatic disease.   03/30/2018 Imaging    CT CAP W Contrast 03/30/18 IMPRESSION: 1. New mild contiguous wall thickening with associated mucosal hyperenhancement throughout the left colon and rectum, suggesting nonspecific infectious or inflammatory proctocolitis. Differential includes C diff colitis. 2. Stable faint patchy sclerosis in the iliac bones and lumbar vertebral bodies. No new or progressive osseous metastatic disease by CT. Please note that the osseous lesions are relatively occult by CT and would be better evaluated by PET-CT, as clinically warranted. 3. No evidence of extra osseous metastatic disease.  These results will be called to the ordering clinician or representative by the Radiologist Assistant, and communication documented in the PACS or zVision Dashboard.   04/15/2018 - 06/16/2018 Chemotherapy   Enhertu q3weeks starting 04/15/18. Stopped Enhertu after cycle 3 on 05/27/18 due to poor toleration due to poor toleration with anorexia, weight loss, N&V, diarrhea   04/21/2018 PET scan   PET 04/21/18  IMPRESSION: 1. Overall marked interval improvement in bony hypermetabolic disease. All of the  previously identified hypermetabolic osseous metastases have decreased in hypermetabolism in the interval. There is a new tiny focus of hypermetabolism in the right aspect of the L3 vertebral body that appears to correspond to a new 8 mm lucency, raising concern for new metastatic disease. Close attention on follow-up recommended. 2. Focal hypermetabolism in the central spinal canal at the level of L1 previously has resolved. 3. No hypermetabolic soft tissue disease on today's study. 4. Right thyroid nodule seen previously has decreased in the interval.    06/16/2018 Imaging   CT LUMBAR SPINE WO CONTRAST 06/16/18 IMPRESSION: 1. Transitional lumbosacral anatomy. Correlation with radiographs is recommended prior to any operative intervention. 2. Suspicion of a right L3 vertebral body metastasis on the April PET-CT is not evident by CT. Stable degenerative versus metastatic lesion at the right L5 superior endplate. Small but increased right iliac bone metastasis since March. Lumbar MRI would best characterize lumbar metastatic disease (without contrast if necessary). 3. Suspect L2-L3 left subarticular segment disc herniation. Query left L2 radiculitis. 4. Stable appearance of rightward L4-L5 disc degeneration which could affect the right foramen and right lateral recess.   06/17/2018 - 11/16/2018 Chemotherapy   Restart Taxol 2 weeks on/1 week off with Herceptin and Perjeta q3weeks on 06/17/18. Perjeta was stopped after C5 due to diarrhea. Stopped Taxol after 11/16/18 due to disease progression to L3.    07/06/2018 PET scan   PET  IMPRESSION: 1. Near total resolution of hypermetabolic activity associated with skeletal metastasis. Very mild activity associated with the LEFT iliac bone which is similar to background blood pool activity. 2. No new   or progressive disease.   11/21/2018 PET scan   IMPRESSION: 1. New focal hypermetabolic lesion eccentric to the right in the L3 vertebral body,  maximum SUV 6.1 compatible with a new focus of active malignancy. 2. Very low-grade activity similar to blood pool in previous existing mildly sclerotic metastatic lesions including the left iliac bone lesion. Some of the mildly sclerotic lesions are not hypermetabolic compatible with effectively treated bony metastatic disease. 3. No hypermetabolic soft tissue lesions are identified.     11/30/2018 - 08/25/2019 Chemotherapy   Continue Herceptin q3weeks. Stopped 08/25/19 due to significant disease progression and switched to  IV Matuzumab   12/12/2018 - 12/17/2018 Chemotherapy   Tucatinib (TUKYSA) 6 tabs BID as a next line therapy starting 12/12/18. Stopped 12/17/18 duet o N&V, poor appetite and HA    12/21/2018 - 03/17/2019 Chemotherapy   IV Eribulin on day 1, 8 every 21 days starting 12/21/18. Stopped 03/17/19 due to mixed response to treatment.    03/21/2019 PET scan   IMPRESSION: 1. Mixed response to therapy of osseous metastasis. Although a right-sided L4 lesion demonstrates decreased hypermetabolism today, new left iliac and right ischial hypermetabolic lesions are seen. 2. No evidence of hypermetabolic soft tissue metastasis.     03/27/2019 - 07/21/2019 Chemotherapy   Neratinib (Nerlynx) titrate up from 120mg to 240mg starting 03/27/19. She could not tolerate 160mg and stopped on 02/05/19 due to N&V and diarrhea. Will restart at 120mg on 04/10/18-04/27/19 and 4/19-4/22. Reduced to 80mg once daily on 05/10/19. Held/stopped since 07/21/19 due to dry cough, Nausea and body aches.   06/23/2019 Imaging   CT Head  IMPRESSION: 1. No acute intracranial abnormality or metastatic disease identified. 2. Chronic white matter disease in the right hemisphere is stable from prior exams.     09/05/2019 PET scan   IMPRESSION: 1. Significant progression of widespread hypermetabolic osseous metastases throughout the axial skeleton as detailed. 2. No hypermetabolic extraosseous sites of metastatic disease.    09/15/2019 - 12/22/2019 Chemotherapy   -IV Margetuximab q3weeks and IV Gemcitabine 2 weeks on/1 week off starting 09/15/19. Carboplatin added to Gemcitabine starting with C3 on 11/03/19. D/c after C5 due to disease progression in bone.    12/13/2019 Imaging   PET  IMPRESSION: 1. Interval progression of widespread, FDG avid osseous metastasis involving the axial skeleton. 2. No FDG avid extra osseous sites of metastatic disease.     01/19/2020 -  Chemotherapy   -Xeloda BID 2 weeks on/1 week off and Herceptin q3weeks starting 01/19/20. C1 at 1000mg BID. C2 increased to 1500mg in the AM and 1000mg in the PM starting 02/12/20.   04/09/2020 Imaging   CT CAP  IMPRESSION: 1. New 3 mm RIGHT upper lobe nodule, nonspecific, attention on follow-up. 2. What may represent an incomplete periprosthetic fracture about the cephalo medullary rod in the LEFT proximal femur. This linear band of incomplete bony fusion that is more pronounced than on previous CT imaging that was acquired in 2017 and may represent a stress type fracture about hardware. Correlate with any pain in this area. Sclerotic margins show chronic features but the linear band passing through the bone around the intramedullary rod is more extensive, involving approximately 270 degrees of the circumference of the femur, medial cortex remains intact. 3. Other findings of bony metastatic disease are similar to prior imaging. Sclerosis and some periosteal reaction about a nondisplaced pathologic fracture of the LEFT lateral seventh rib. 4. Small hypervascular focus in the RIGHT hepatic lobe, indistinct but new compared to   previous imaging. Perhaps this represents a transient phenomenon though is not imaged on the later phase to allow for confirmation. Liver MRI may be helpful for further evaluation, to exclude underlying metastatic lesion as clinically warranted. 5. Hepatic steatosis. 6. Aortic atherosclerosis.   Aortic Atherosclerosis  (ICD10-I70.0).        CURRENT THERAPY:  -Zometa q18months -Xeloda BID 2 weeks on/1 week off and Herceptinq3weeks starting1/7/22.C1 at 1000mg  BID.C2 increased to 1500mg  in the AM and 1000mg  in the [place] 02/12/20.Held since 03/22/20 due to hyperbilirubinemia.restart at 1000mg  bid 7days on and 7 days off on 04/19/2020  INTERVAL HISTORY:  [name] is here for a follow up. She was last seen by me 05/03/20. She presents to the clinic with her interpretor. She presents to the clinic with interpreter Juliann Pulse.  She is clinically stable, pain is stable, she uses oxycodone 1 to 2 tablets a day.  She noticed slightly worsening fatigue, and sleeps more lately.  She usually gets up in the morning around 11am, and take Xarelto after the first meal after she gets up, then again around dinnertime.  No significant nausea, or issues with bowel movement.  She plans to go to [facility] in mid June and will return after 4 July.  All other systems were reviewed with the patient and are negative.  MEDICAL HISTORY:  Past Medical History:  Diagnosis Date  . Abnormal Pap smear   . Anxiety   . Breast cancer ([place])   . Cancer ([place]) 1999   breast-s/p mastectomy, chemo, rad  . CIN I (cervical intraepithelial neoplasia I) 2003   by colpo  . Congenital deafness   . Deaf   . Depression   . Port-A-Cath in place 2017   for chemotherapy  . Radiation 07/12/15-07/26/15   left femur 30 Gy  . S/P bilateral oophorectomy     SURGICAL HISTORY: Past Surgical History:  Procedure Laterality Date  . ABDOMINAL HYSTERECTOMY    . INTRAMEDULLARY (IM) NAIL INTERTROCHANTERIC Left 06/26/2015   Procedure: LEFT BIOMET LONG AFFIXS NAIL;  Surgeon: [name], MD;  Location: [place];  Service: Orthopedics;  Laterality: Left;  . IR GENERIC HISTORICAL  08/06/2015   IR US GUIDE VASC ACCESS LEFT 08/06/2015 Aletta Edouard, MD [facility]  . IR GENERIC HISTORICAL  08/06/2015   IR FLUORO GUIDE CV LINE LEFT 08/06/2015 Aletta Edouard,  MD [facility]  . IR GENERIC HISTORICAL  03/05/2016   IR CV LINE INJECTION 03/05/2016 Markus Daft, MD [facility]  . LEFT HEART CATH AND CORONARY ANGIOGRAPHY N/A 12/13/2017   Procedure: LEFT HEART CATH AND CORONARY ANGIOGRAPHY;  Surgeon: Leonie Man, MD;  Location: [place] [facility];  Service: Cardiovascular;  Laterality: N/A;  . MASTECTOMY     right breast  . MASTECTOMY    . OVARIAN CYST REMOVAL    . RADIOLOGY WITH ANESTHESIA Left 06/25/2015   Procedure: MRI OF LEFT HIP WITH OR WITHOUT CONTRAST;  Surgeon: Medication Radiologist, MD;  Location: [place];  Service: Radiology;  Laterality: Left;  DR. MCINTYRE/MRI  . TUBAL LIGATION      I have reviewed the social history and family history with the patient and they are unchanged from previous note.  ALLERGIES:  is allergic to promethazine hcl, chlorhexidine, and diphenhydramine hcl.  MEDICATIONS:  Current Outpatient Medications  Medication Sig Dispense Refill  . ALPRAZolam (XANAX) 0.25 MG tablet Take 1 tablet (0.25 mg total) by mouth 2 (two) times daily as needed for anxiety. 30 tablet 0  .  capecitabine (XELODA) 500 MG tablet TAKE 2 TABS IN MORNING, AND 2 TABS IN EVENING. TAKE FOR 7 DAYS ON, 7 DAYS OFF. REPEAT EVERY 14 DAYS. START ON 04/19/2020 56 tablet 1  . diphenoxylate-atropine (LOMOTIL) 2.5-0.025 MG tablet Take 1-2 tablets by mouth 4 (four) times daily as needed for diarrhea or loose stools. 60 tablet 1  . lidocaine-prilocaine (EMLA) cream Apply 1 application topically as needed. Apply to Porta-Cath 1-2 hours prior to access as directed. 90 g 2  . ondansetron (ZOFRAN) 8 MG tablet Take 1 tablet (8 mg total) by mouth every 8 (eight) hours as needed for nausea or vomiting. 45 tablet 1  . oxyCODONE (ROXICODONE) 5 MG immediate release tablet Take 1 tablet (5 mg total) by mouth every 8 (eight) hours as needed for severe pain. Refill on 02/17/2020 90 tablet 0  . potassium chloride (KLOR-CON) 10 MEQ tablet Take 1 tablet (10 mEq total) by mouth 3  (three) times daily. 90 tablet 2  . prochlorperazine (COMPAZINE) 10 MG tablet Take 1 tablet (10 mg total) by mouth 2 (two) times daily as needed for nausea or vomiting. 20 tablet 1  . zolpidem (AMBIEN) 10 MG tablet Take 1 tablet (10 mg total) by mouth at bedtime as needed for sleep. 15 tablet 2   No current facility-administered medications for this visit.   Facility-Administered Medications Ordered in Other Visits  Medication Dose Route Frequency Provider Last Rate Last Admin  . potassium chloride SA (K-DUR) CR tablet 40 mEq  40 mEq Oral Once Truitt Merle, MD      . sodium chloride 0.9 % 1,000 mL with potassium chloride 10 mEq infusion   Intravenous Continuous Gordy Levan, MD   Stopped at 09/10/15 1653  . sodium chloride 0.9 % injection 10 mL  10 mL Intravenous PRN Livesay, Lennis P, MD      . sodium chloride flush (NS) 0.9 % injection 10 mL  10 mL Intracatheter PRN Truitt Merle, MD   10 mL at 05/17/20 1015    PHYSICAL EXAMINATION: ECOG PERFORMANCE STATUS: 2 - Symptomatic, <50% confined to bed  Vitals:   05/17/20 0817  BP: 117/66  Pulse: 88  Resp: 18  Temp: (!) 97.1 F (36.2 C)  SpO2: 99%   Filed Weights   05/17/20 0817  Weight: 137 lb (62.1 kg)    GENERAL:alert, no distress and comfortable SKIN: skin color, texture, turgor are normal, no rashes or significant lesions EYES: normal, Conjunctiva are pink and non-injected, sclera clear NECK: supple, thyroid normal size, non-tender, without nodularity LYMPH:  no palpable lymphadenopathy in the cervical, axillary  LUNGS: clear to auscultation and percussion with normal breathing effort HEART: regular rate & rhythm and no murmurs and no lower extremity edema ABDOMEN:abdomen soft, non-tender and normal bowel sounds Musculoskeletal:no cyanosis of digits and no clubbing  NEURO: alert & oriented x 3 with fluent speech, no focal motor/sensory deficits  LABORATORY DATA:  I have reviewed the data as listed CBC Latest Ref Rng & Units  05/17/2020 05/03/2020 04/12/2020  WBC 4.0 - 10.5 K/uL 3.9(L) 3.9(L) 3.9(L)  Hemoglobin 12.0 - 15.0 g/dL 11.0(L) 11.0(L) 11.2(L)  Hematocrit 36.0 - 46.0 % 32.2(L) 31.9(L) 32.0(L)  Platelets 150 - 400 K/uL 172 167 199     CMP Latest Ref Rng & Units 05/17/2020 05/03/2020 04/12/2020  Glucose 70 - 99 mg/dL 113(H) 88 84  BUN 6 - 20 mg/dL $Remove'11 9 13  'gsYJbZn$ Creatinine 0.44 - 1.00 mg/dL 0.99 0.88 0.91  Sodium 135 - 145 mmol/L 143  143 141  Potassium 3.5 - 5.1 mmol/L 3.1(L) 3.3(L) 3.6  Chloride 98 - 111 mmol/L 107 107 105  CO2 22 - 32 mmol/L 24 25 26   Calcium 8.9 - 10.3 mg/dL 9.1 8.7(L) 8.6(L)  Total Protein 6.5 - 8.1 g/dL 6.6 6.4(L) 6.8  Total Bilirubin 0.3 - 1.2 mg/dL 2.0(H) 2.6(H) 2.5(H)  Alkaline Phos 38 - 126 U/L 69 63 76  AST 15 - 41 U/L 16 15 18   ALT 0 - 44 U/L 11 9 19       RADIOGRAPHIC STUDIES: I have personally reviewed the radiological images as listed and agreed with the findings in the report. No results found.   ASSESSMENT & PLAN:  IISHA SOYARS is a 55 y.o. 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 She had right mastectomy, 4 cycles of AC-T and 5 years of Tamoxifen/AI.  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 -After her first line ofchemotreatment and initial maintenance therapy, she progressed or had poor toleration of multiple lines of treatment since then. -Based on 12/13/19 PET, she progressed diffusely in the bone on7th linetherapywith anti-HER2IV Margetuximabq3weekswithIV Chemo weeklyGemcitabine(Carboplatin added C3)2 weeks on/1 week offafter 5 cycles,09/15/19-12/22/19. -Ipreviouslydiscussed the longer she is on treatment the less treatment options and efficacy she will have. -Istarted her onXeloda 1000mg  BID 2 weeks on/1 week off for first cycle on 01/19/20. If tolerating well will increase.Continue Herceptin  q3weeks.Increased to1500mg  in the AM and1000mg in the [place]. -Since March she has had worsened hyperbilirubinemia. We have held Xeloda since 04/01/20.  -Her liver function has slightly improved, I restarted her Xarelto at a reduced dose and frequency. -She is clinically stable, lab reviewed, total bilirubin 2.0, continue to improve, will proceed Herceptin and Xeloda 1000 mg twice daily, 7 days on and 7 days off today -Patient has been taking Xeloda around 11 AM, and again 5 PM.  I asked her to change afternoon dose to evening around 9-10pm -f/u in 2 weeks  -I will schedule her future appointments based on her travel plan in June   2.Body pain, secondary to bone mets, Hypocalcemia, leg cramps  -She is being treated with Zometa q35months. -She will continue oral calcium and Vit Dand Prilosec for Acid Reflux. -Current pain includes, lower back, upper legs,lefthip and rightshoulder.PET12/3/21does indicate disease progression in her spine, pelvis and right scapula.She also has headaches occasionally. -Painhas been improving lately. She uses5mg  Oxycodoneas needed, not daily. -After fall in early March she has right hip and side pain. Her left hip and leg pain is stable.  -Her 03/20/20 CT CAP shows possible fracture around pin of left hip from prior surgery.  She was evaluated by orthopedic surgeon Dr. [name], who recommended conservative measurements  3.Congenital deafness  4.Mild anemia, secondary to chemotherapy and malignancy -Moderate andStable   5.Hypokalemia -She developed diarrhea s/p cycle 3 Perjetaand again with Nerlynx, Both have been D/c. -She is onOral potassium 42meq2 tabs daily. Will increase to 3 tabs daily. I encouraged her to increase potassium in diet.  6.Goal of care discussion, DNR/DNI -Wepreviouslyreviewed goal of care andI recommend DNR/DNI, shepreviouslyagreed.  -I encouraged her to start setting up her living will.We previously  discussed hospice care if she has no more treatment available or could not tolerate more treatment  7.Insomnia -She cannot take Benadryl due to allergy reaction. She has tried trazodone with no relief.  -She is on Xanax  andonAmbien'10mg'$ at half tabletsnightly. She knows not to take both at the same time. If she needs Oxycodone at night, she can use half tabletonly.  -Stable.  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 Continue monitoring. Stable.  9. Mildhyperbilirubinemia  -likely second to chemotherapy, fluctuates -Worsened on 03/22/20 withtotal bilirubin 3.2. Increased again to 3.7 on 04/01/20. Xeloda has been held 03/22/20 -There is subtle change in liver on her 04/09/20 CT CAP, but she declined MRI Liver for further evaluation.  -Liver function stable since I restarted Xeloda with dose reduction   PLAN: -Labs reviewed,will proceed withHerceptin today at $Remove'4mg'SkQFnVI$ /kg and continue every 2 weeks. -next cycle Xeloda at 1000 mg twice daily, 7 days on and 7 days off, starting today  -Lab, flush, follow-up and Herceptin in2weeks, she will travel after treatment in 6 weeks    No problem-specific Assessment & Plan notes found for this encounter.   No orders of the defined types were placed in this encounter.  All questions were answered. The patient knows to call the clinic with any problems, questions or concerns. No barriers to learning was detected. The total time spent in the appointment was 30 minutes.     Truitt Merle, MD 05/17/2020   I, Joslyn Devon, am acting as scribe for Truitt Merle, MD.   I have reviewed the above documentation for accuracy and completeness, and I agree with the above.

## 2020-05-15 ENCOUNTER — Other Ambulatory Visit (HOSPITAL_COMMUNITY): Payer: Self-pay

## 2020-05-16 ENCOUNTER — Other Ambulatory Visit: Payer: Self-pay

## 2020-05-16 DIAGNOSIS — C7951 Secondary malignant neoplasm of bone: Secondary | ICD-10-CM

## 2020-05-16 DIAGNOSIS — C50911 Malignant neoplasm of unspecified site of right female breast: Secondary | ICD-10-CM

## 2020-05-17 ENCOUNTER — Inpatient Hospital Stay: Payer: Medicaid Other | Attending: Hematology | Admitting: Hematology

## 2020-05-17 ENCOUNTER — Inpatient Hospital Stay: Payer: Medicaid Other

## 2020-05-17 ENCOUNTER — Other Ambulatory Visit: Payer: Self-pay

## 2020-05-17 ENCOUNTER — Encounter: Payer: Self-pay | Admitting: Hematology

## 2020-05-17 ENCOUNTER — Other Ambulatory Visit (HOSPITAL_COMMUNITY): Payer: Self-pay

## 2020-05-17 VITALS — BP 117/66 | HR 88 | Temp 97.1°F | Resp 18 | Ht 65.0 in | Wt 137.0 lb

## 2020-05-17 DIAGNOSIS — R5383 Other fatigue: Secondary | ICD-10-CM | POA: Insufficient documentation

## 2020-05-17 DIAGNOSIS — E876 Hypokalemia: Secondary | ICD-10-CM | POA: Diagnosis not present

## 2020-05-17 DIAGNOSIS — Z170 Estrogen receptor positive status [ER+]: Secondary | ICD-10-CM | POA: Diagnosis not present

## 2020-05-17 DIAGNOSIS — C50911 Malignant neoplasm of unspecified site of right female breast: Secondary | ICD-10-CM | POA: Diagnosis not present

## 2020-05-17 DIAGNOSIS — C7951 Secondary malignant neoplasm of bone: Secondary | ICD-10-CM | POA: Diagnosis not present

## 2020-05-17 DIAGNOSIS — G4700 Insomnia, unspecified: Secondary | ICD-10-CM | POA: Diagnosis not present

## 2020-05-17 DIAGNOSIS — Z5112 Encounter for antineoplastic immunotherapy: Secondary | ICD-10-CM | POA: Diagnosis not present

## 2020-05-17 DIAGNOSIS — G620 Drug-induced polyneuropathy: Secondary | ICD-10-CM | POA: Insufficient documentation

## 2020-05-17 DIAGNOSIS — H905 Unspecified sensorineural hearing loss: Secondary | ICD-10-CM | POA: Diagnosis not present

## 2020-05-17 DIAGNOSIS — Z95828 Presence of other vascular implants and grafts: Secondary | ICD-10-CM

## 2020-05-17 DIAGNOSIS — D6481 Anemia due to antineoplastic chemotherapy: Secondary | ICD-10-CM | POA: Diagnosis not present

## 2020-05-17 LAB — CBC WITH DIFFERENTIAL (CANCER CENTER ONLY)
Abs Immature Granulocytes: 0.01 10*3/uL (ref 0.00–0.07)
Basophils Absolute: 0 10*3/uL (ref 0.0–0.1)
Basophils Relative: 1 %
Eosinophils Absolute: 0.1 10*3/uL (ref 0.0–0.5)
Eosinophils Relative: 3 %
HCT: 32.2 % — ABNORMAL LOW (ref 36.0–46.0)
Hemoglobin: 11 g/dL — ABNORMAL LOW (ref 12.0–15.0)
Immature Granulocytes: 0 %
Lymphocytes Relative: 34 %
Lymphs Abs: 1.3 10*3/uL (ref 0.7–4.0)
MCH: 32.8 pg (ref 26.0–34.0)
MCHC: 34.2 g/dL (ref 30.0–36.0)
MCV: 96.1 fL (ref 80.0–100.0)
Monocytes Absolute: 0.3 10*3/uL (ref 0.1–1.0)
Monocytes Relative: 8 %
Neutro Abs: 2.1 10*3/uL (ref 1.7–7.7)
Neutrophils Relative %: 54 %
Platelet Count: 172 10*3/uL (ref 150–400)
RBC: 3.35 MIL/uL — ABNORMAL LOW (ref 3.87–5.11)
RDW: 14.1 % (ref 11.5–15.5)
WBC Count: 3.9 10*3/uL — ABNORMAL LOW (ref 4.0–10.5)
nRBC: 0 % (ref 0.0–0.2)

## 2020-05-17 LAB — CMP (CANCER CENTER ONLY)
ALT: 11 U/L (ref 0–44)
AST: 16 U/L (ref 15–41)
Albumin: 3.8 g/dL (ref 3.5–5.0)
Alkaline Phosphatase: 69 U/L (ref 38–126)
Anion gap: 12 (ref 5–15)
BUN: 11 mg/dL (ref 6–20)
CO2: 24 mmol/L (ref 22–32)
Calcium: 9.1 mg/dL (ref 8.9–10.3)
Chloride: 107 mmol/L (ref 98–111)
Creatinine: 0.99 mg/dL (ref 0.44–1.00)
GFR, Estimated: >60 mL/min (ref >60)
Glucose, Bld: 113 mg/dL — ABNORMAL HIGH (ref 70–99)
Potassium: 3.1 mmol/L — ABNORMAL LOW (ref 3.5–5.1)
Sodium: 143 mmol/L (ref 135–145)
Total Bilirubin: 2 mg/dL — ABNORMAL HIGH (ref 0.3–1.2)
Total Protein: 6.6 g/dL (ref 6.5–8.1)

## 2020-05-17 MED ORDER — CAPECITABINE 500 MG PO TABS
ORAL_TABLET | ORAL | 1 refills | Status: DC
Start: 2020-05-17 — End: 2020-06-26
  Filled 2020-05-17 (×2): qty 56, 14d supply, fill #0
  Filled 2020-06-14: qty 56, 14d supply, fill #1

## 2020-05-17 MED ORDER — ACETAMINOPHEN 325 MG PO TABS
650.0000 mg | ORAL_TABLET | Freq: Once | ORAL | Status: AC
Start: 2020-05-17 — End: 2020-05-17
  Administered 2020-05-17: 650 mg via ORAL

## 2020-05-17 MED ORDER — POTASSIUM CHLORIDE ER 10 MEQ PO TBCR: 10.0000 meq | EXTENDED_RELEASE_TABLET | Freq: Three times a day (TID) | ORAL | 2 refills | Status: DC

## 2020-05-17 MED ORDER — LORATADINE 10 MG PO TABS
ORAL_TABLET | ORAL | Status: AC
  Filled 2020-05-17: qty 1

## 2020-05-17 MED ORDER — OXYCODONE HCL 5 MG PO TABS: 5.0000 mg | ORAL_TABLET | Freq: Three times a day (TID) | ORAL | 0 refills | Status: DC | PRN

## 2020-05-17 MED ORDER — SODIUM CHLORIDE 0.9% FLUSH
10.0000 mL | INTRAVENOUS | Status: DC | PRN
  Administered 2020-05-17: 10 mL
  Filled 2020-05-17: qty 10

## 2020-05-17 MED ORDER — LORATADINE 10 MG PO TABS
10.0000 mg | ORAL_TABLET | Freq: Once | ORAL | Status: AC
  Administered 2020-05-17: 10 mg via ORAL

## 2020-05-17 MED ORDER — TRASTUZUMAB-DKST CHEMO 150 MG IV SOLR
4.0000 mg/kg | Freq: Once | INTRAVENOUS | Status: AC
  Administered 2020-05-17: 252 mg via INTRAVENOUS
  Filled 2020-05-17: qty 12

## 2020-05-17 MED ORDER — HEPARIN SOD (PORK) LOCK FLUSH 100 UNIT/ML IV SOLN
500.0000 [IU] | Freq: Once | INTRAVENOUS | Status: AC | PRN
  Administered 2020-05-17: 500 [IU]
  Filled 2020-05-17: qty 5

## 2020-05-17 MED ORDER — SODIUM CHLORIDE 0.9 % IV SOLN
Freq: Once | INTRAVENOUS | Status: AC
Start: 2020-05-17 — End: 2020-05-17
  Filled 2020-05-17: qty 250

## 2020-05-17 MED ORDER — SODIUM CHLORIDE 0.9% FLUSH
10.0000 mL | Freq: Once | INTRAVENOUS | Status: AC
  Administered 2020-05-17: 10 mL
  Filled 2020-05-17: qty 10

## 2020-05-17 MED ORDER — ACETAMINOPHEN 325 MG PO TABS
ORAL_TABLET | ORAL | Status: AC
  Filled 2020-05-17: qty 2

## 2020-05-17 NOTE — Patient Instructions (Signed)
Implanted Port Insertion, Care After This sheet gives you information about how to care for yourself after your procedure. Your health care provider may also give you more specific instructions. If you have problems or questions, contact your health care provider. What can I expect after the procedure? After the procedure, it is common to have:  Discomfort at the port insertion site.  Bruising on the skin over the port. This should improve over 3-4 days. Follow these instructions at home: Port care  After your port is placed, you will get a manufacturer's information card. The card has information about your port. Keep this card with you at all times.  Take care of the port as told by your health care provider. Ask your health care provider if you or a family member can get training for taking care of the port at home. A home health care nurse may also take care of the port.  Make sure to remember what type of port you have. Incision care  Follow instructions from your health care provider about how to take care of your port insertion site. Make sure you: ? Wash your hands with soap and water before and after you change your bandage (dressing). If soap and water are not available, use hand sanitizer. ? Change your dressing as told by your health care provider. ? Leave stitches (sutures), skin glue, or adhesive strips in place. These skin closures may need to stay in place for 2 weeks or longer. If adhesive strip edges start to loosen and curl up, you may trim the loose edges. Do not remove adhesive strips completely unless your health care provider tells you to do that.  Check your port insertion site every day for signs of infection. Check for: ? Redness, swelling, or pain. ? Fluid or blood. ? Warmth. ? Pus or a bad smell.      Activity  Return to your normal activities as told by your health care provider. Ask your health care provider what activities are safe for you.  Do not  lift anything that is heavier than 10 lb (4.5 kg), or the limit that you are told, until your health care provider says that it is safe. General instructions  Take over-the-counter and prescription medicines only as told by your health care provider.  Do not take baths, swim, or use a hot tub until your health care provider approves. Ask your health care provider if you may take showers. You may only be allowed to take sponge baths.  Do not drive for 24 hours if you were given a sedative during your procedure.  Wear a medical alert bracelet in case of an emergency. This will tell any health care providers that you have a port.  Keep all follow-up visits as told by your health care provider. This is important. Contact a health care provider if:  You cannot flush your port with saline as directed, or you cannot draw blood from the port.  You have a fever or chills.  You have redness, swelling, or pain around your port insertion site.  You have fluid or blood coming from your port insertion site.  Your port insertion site feels warm to the touch.  You have pus or a bad smell coming from the port insertion site. Get help right away if:  You have chest pain or shortness of breath.  You have bleeding from your port that you cannot control. Summary  Take care of the port as told by your   health care provider. Keep the manufacturer's information card with you at all times.  Change your dressing as told by your health care provider.  Contact a health care provider if you have a fever or chills or if you have redness, swelling, or pain around your port insertion site.  Keep all follow-up visits as told by your health care provider. This information is not intended to replace advice given to you by your health care provider. Make sure you discuss any questions you have with your health care provider. Document Revised: 07/27/2017 Document Reviewed: 07/27/2017 Elsevier Patient Education   2021 [employer].  

## 2020-05-17 NOTE — Patient Instructions (Signed)
[facility]  Discharge Instructions: Thank you for choosing [facility] to provide your oncology and hematology care.   If you have a lab appointment with the Cancer Center, please go directly to the Cancer Center and check in at the registration area.   Wear comfortable clothing and clothing appropriate for easy access to any Portacath or PICC line.   We strive to give you quality time with your provider. You may need to reschedule your appointment if you arrive late (15 or more minutes).  Arriving late affects you and other patients whose appointments are after yours.  Also, if you miss three or more appointments without notifying the office, you may be dismissed from the clinic at the provider's discretion.      For prescription refill requests, have your pharmacy contact our office and allow 72 hours for refills to be completed.    Today you received the following chemotherapy and/or immunotherapy agents trastuzumab      To help prevent nausea and vomiting after your treatment, we encourage you to take your nausea medication as directed.  BELOW ARE SYMPTOMS THAT SHOULD BE REPORTED IMMEDIATELY: *FEVER GREATER THAN 100.4 F (38 C) OR HIGHER *CHILLS OR SWEATING *NAUSEA AND VOMITING THAT IS NOT CONTROLLED WITH YOUR NAUSEA MEDICATION *UNUSUAL SHORTNESS OF BREATH *UNUSUAL BRUISING OR BLEEDING *URINARY PROBLEMS (pain or burning when urinating, or frequent urination) *BOWEL PROBLEMS (unusual diarrhea, constipation, pain near the anus) TENDERNESS IN MOUTH AND THROAT WITH OR WITHOUT PRESENCE OF ULCERS (sore throat, sores in mouth, or a toothache) UNUSUAL RASH, SWELLING OR PAIN  UNUSUAL VAGINAL DISCHARGE OR ITCHING   Items with * indicate a potential emergency and should be followed up as soon as possible or go to the Emergency Department if any problems should occur.  Please show the CHEMOTHERAPY ALERT CARD or IMMUNOTHERAPY ALERT CARD at check-in to  the Emergency Department and triage nurse.  Should you have questions after your visit or need to cancel or reschedule your appointment, please contact [facility]  Dept: [phone]  and follow the prompts.  Office hours are 8:00 a.m. to 4:30 p.m. Monday - Friday. Please note that voicemails left after 4:00 p.m. may not be returned until the following business day.  We are closed weekends and major holidays. You have access to a nurse at all times for urgent questions. Please call the main number to the clinic Dept: [phone] and follow the prompts.   For any non-urgent questions, you may also contact your provider using MyChart. We now offer e-Visits for anyone 18 and older to request care online for non-urgent symptoms. For details visit [email or web address].   Also download the MyChart app! Go to the app store, search "MyChart", open the app, select , and log in with your MyChart username and password.  Due to Covid, a mask is required upon entering the hospital/clinic. If you do not have a mask, one will be given to you upon arrival. For doctor visits, patients may have 1 support person aged 18 or older with them. For treatment visits, patients cannot have anyone with them due to current Covid guidelines and our immunocompromised population.   

## 2020-05-18 LAB — CANCER ANTIGEN 27.29: CA 27.29: 76.2 U/mL — ABNORMAL HIGH (ref 0.0–38.6)

## 2020-05-21 ENCOUNTER — Telehealth: Payer: Self-pay | Admitting: Hematology

## 2020-05-21 NOTE — Telephone Encounter (Signed)
Scheduled follow-up appointments per 5/6 los. Mailed calendar.

## 2020-05-24 ENCOUNTER — Other Ambulatory Visit (HOSPITAL_COMMUNITY): Payer: Self-pay

## 2020-05-27 ENCOUNTER — Other Ambulatory Visit (HOSPITAL_COMMUNITY): Payer: Self-pay

## 2020-05-27 NOTE — Progress Notes (Signed)
[facility]   [phone] [phone]   Clinic Follow up Note   Patient Care Team: [name], [name], [name] as PCP - General (Physician Assistant) [name], MD as Consulting Physician (Orthopedic Surgery)  Date of Service:  05/31/2020  CHIEF COMPLAINT:  F/u of metastatic breast cancer  SUMMARY OF ONCOLOGIC HISTORY: Oncology History Overview Note   Cancer Staging No matching staging information was found for the patient.     [name], right ([place])  11/1997 Cancer Diagnosis   Patient had 1.4 cm poorly differentiated right breast carcinoma diagnosed in Nov 1999 at age 55, 1/7 axillary nodes involved, ER/PR and HER 2 positive. She had mastectomy with the 7 axillary node evaluation, 4 cycles of adriamycin/ cytoxan followed by taxotere, then five years of tamoxifen thru June 2005 (no herceptin in 1999). She had bilateral oophorectomy in May 2005. She was briefly on aromatase inhibitor after tamoxifen, but discontinued this herself due to poor tolerance.   04/04/2015 Genetic Testing   Negative genetic testing on the breast/ovarian cancer pnael.  The Breast/Ovarian gene panel offered by [facility] includes sequencing and rearrangement analysis for the following 20 genes:  ATM, BARD1, BRCA1, BRCA2, BRIP1, CDH1, CHEK2, EPCAM, FANCC, MLH1, MSH2, MSH6, NBN, PALB2, PMS2, PTEN, RAD51C, RAD51D, TP53, and XRCC2.   06/26/2015 Pathology Results   Initial Path Report Bone, curettage, Left femur met, intramedullary subtroch tissue - METASTATIC ADENOCARCINOMA.  Estrogen Receptor: 95%, POSITIVE, STRONG STAINING INTENSITY Progesterone Receptor: 2%, POSITIVE, STRONG  HER2 (+), IHC 3+   06/26/2015 Surgery   Biopsy of metastatic tissue and stabilization with Affixus trochanteric nail, proximal and distal interlock of left femur by Dr. [name]    06/28/2015 Imaging   CT Chest w/ Contrast 1. Extensive right internal mammary chain  lymphadenopathy consistent with metastatic breast cancer. 2. Lytic metastases involving the posterior elements at T1 with probable nondisplaced pathologic fracture of the spinous process. 3. No other evidence of thoracic metastatic disease. 4. Trace bilateral pleural effusions with associated bibasilar atelectasis. 5. Indeterminate right thyroid nodule, not previously imaged. This could be evaluated with thyroid ultrasound as clinically warranted.   06/29/2015 -  Chemotherapy   Zometa started monthly on 06/29/15 and switched to q24months from 01/08/17   07/01/2015 Imaging   Bone Scan 1. Increased activity noted throughout the left femur. Although metastatic disease cannot be excluded, these changes are most likely secondary to prior surgery. 2. No other focal abnormalities identified to suggest metastatic disease.   07/12/2015 - 07/26/2015 Radiation Therapy   Left femur, 30 Gy in 10 fractions by Dr. Sondra Come    07/14/2015 Initial Diagnosis   [name], right ([place])   07/19/2015 Imaging   Initial PET Scan 1. Severe multifocal osseous metastatic disease, much greater than anticipated based on prior imaging. 2. Multifocal nodal metastases involving the right internal mammary, prevascular and subpectoral lymph nodes. No axillary pulmonary involvement identified. 3. No distant extra osseous metastases.   07/30/2015 - 11/29/2015 Chemotherapy   Docetaxel, Herceptin and Perjeta every 3 weeks X6 cycles, pt tolerated moderately well, restaging scan showed excellent response    09/18/2015 Imaging   CT Head w/wo Contrast 1. No acute intracranial abnormality or significant interval change. 2. Stable minimal periventricular white matter hypoattenuation on the right. This may reflect a remote ischemic injury. 3. No evidence for metastatic disease to the brain. 4. No focal soft tissue lesion to explain the patient's tenderness.   10/30/2015 Imaging  CT Abdomen Pelvis w/  Contrast 1. Few mildly prominent fluid-filled loops of small bowel scattered throughout the abdomen, with intraluminal fluid density within the distal colon. Given the provided history, findings are suggestive of acute enteritis/diarrheal illness. 2. No other acute intra-abdominal or pelvic process identified. 3. Widespread osseous metastatic disease, better evaluated on most recent PET-CT from 07/19/2015. No pathologic fracture or other complication. 4. 11 mm hypodensity within the left kidney. This lesion measures intermediate density, and is indeterminate. While this lesion is similar in size relative to recent studies, this is increased in size relative to prior study from 2012. Further evaluation with dedicated renal mass protocol CT and/or MRI is recommended for complete characterization.   12/2015 - 05/14/2016 Chemotherapy   Maintenance Herceptin and pejeta every 3 weeks stopped on 05/14/16 due to disease progression   12/25/2015 Imaging   Restaging PET Scan 1. Markedly improved skeletal activity, with only several faint foci of residual accentuated metabolic activity, but resolution of the vast majority of the previously extensive osseous metastatic disease. 2. Resolution of the prior right internal mammary and right prevascular lymph nodes. 3. Residual subcutaneous edema and edema along fascia planes in the left upper thigh. This remains somewhat more than I would expect for placement of an IM nail 6 weeks ago. If there is leg swelling further down, consider Doppler venous ultrasound to rule out left lower extremity DVT. I do not perceive an obvious difference in density between the pelvic and common femoral veins on the noncontrast CT data. 4. Chronic right maxillary and right sphenoid sinusitis.   01/16/2016 - 10/20/2017 Anti-estrogen oral therapy   Letrozole 2.5 mg daily. She was switched to exemestane due to disease progression on 02/08/17. Stopped on 10/20/2017 when treatment changed to chemo     03/01/2016 Miscellaneous   Patient presented to ED following a fall; she reports she landed on her back and is now experiencing back pain. The patient was evaluated and discharge home same day with pain control.   05/01/2016 Imaging   CT CAP IMPRESSION: 1. Stable exam.  No new or progressive disease identified. 2. No evidence for residual or recurrent adenopathy within the chest. 3. Stable bone metastasis.   05/01/2016 Imaging   BONE SCAN IMPRESSION: 1. Small focal uptake involving the anterior rib ends of the left second and right fourth ribs, new since the prior bone scan. The location of this uptake is more suggestive of a traumatic or inflammatory etiology as opposed to metastatic disease. No other evidence of metastatic disease. 2. There are other areas of stable uptake which are most likely degenerative/reactive, including right shoulder and cervical spine uptake and uptake along the right femur adjacent to the ORIF hardware.   05/20/2016 Imaging   NM PET Skull to Thigh IMPRESSION: 1. There multiple metastatic foci in the bony pelvis, including some new abnormal foci compare to the prior PET-CT, compatible with mildly progressive bony metastatic disease. Also, a left T11 vertebral hypermetabolic metastatic lesion is observed, new compared to the prior exam. 2. No active extraosseous malignancy is currently identified. 3. Right anterior fourth rib healing fracture, appears be   06/04/2016 - 01/08/2017 Chemotherapy   Second line chemotherapy Kadcyla every 3 weeks started on 06/04/16 and stopped on 01/08/17 due to mixed response.     10/05/2016 Imaging   CT CAP and Bone Scan:  Bones: left intramedullary rod and left femoral head neck screw in place. In the proximal diaphysis of the left femur there is cortical thickening and   irregularity. No lytic or blastic bone lesions. No fracture or vertebral endplate destruction.   No definite evidence of recurrent disease in the  chest, abdomen, or pelvis.   10/28/2016 Imaging   CT A/P IMPRESSION: No acute process demonstrated in the abdomen or pelvis. No evidence of bowel obstruction or inflammation.   11/04/2016 Imaging   DG foot complete left: IMPRESSION: No fracture or dislocation.  No soft tissue abnormality   11/23/2016 Imaging   CT RIGHT FEMUR IMPRESSION: Negative CT scan of the right thigh.  No visible metastatic disease.  CT LEFT FEMUR IMPRESSION: 1. Postsurgical changes related to prior cephalomedullary rod fixation of the left femur with linear lucency along the anterolateral proximal femoral cortex at level of the lesser trochanter, suspicious for nondisplaced fracture. 2. Slightly permeative appearance of the proximal left femoral cortex at the site of known prior osseous metastases is largely unchanged. 3. Unchanged patchy lucency and sclerosis along the anterior acetabulum, which appears to correspond with an area of faint uptake on the prior PET-CT, suspicious for osseous metastasis. These results will be called to the ordering clinician or representative by the Radiologist Assistant, and communication documented in the PACS or zVision Dashboard.     01/27/2017 Imaging   IMPRESSION: 1. Mixed response to osseous metastasis. Some hypermetabolic lesions are new and increasingly hypermetabolic, while another index lesion is decreasingly hypermetabolic. 2. No evidence of hypermetabolic soft tissue metastasis.    02/08/2017 - 10/19/2017 Antibody Plan   -Herceptin every 3 week since 02/08/17 -Oral Lanpantinib 1562m and decreased to 5045mdue to diarrhea and depression. Due to disease progression she swithced to Verzenio 15035mn 05/04/17. She did not toelrate well so she was switched to Ibrance 100m50m 05/31/17. Due to her neutropenia, Ibrance dose reduced to 75 mg daily, 3 weeks on, one-week off, stopped on 10/20/2017 due to disease progression.    04/30/2017 PET scan   IMPRESSION: 1.  Primarily increase in hypermetabolism of osseous metastasis. An isolated right acetabular lesion is decreased in hypermetabolism since the prior exam. 2. No evidence of hypermetabolic soft tissue metastasis. 3. Similar non FDG avid right-sided thyroid nodule, favoring a benign etiology. Recommend attention on follow-up.   08/30/2017 PET scan   08/30/2017 PET Scan  IMPRESSION: 1. Worsening osseous metastatic disease. 2. Focal hypermetabolism in the central spinal canal the L1 level, similar to the prior exam. Metastatic implant cannot be excluded. 3. Slight enlargement of a right thyroid nodule which now appears more cystic in character. Consider further evaluation with thyroid ultrasound. If patient is clinically hyperthyroid, consider nuclear medicine thyroid uptake and scan   08/30/2017 Progression   08/30/2017 PET Scan  IMPRESSION: 1. Worsening osseous metastatic disease. 2. Focal hypermetabolism in the central spinal canal the L1 level, similar to the prior exam. Metastatic implant cannot be excluded. 3. Slight enlargement of a right thyroid nodule which now appears more cystic in character. Consider further evaluation with thyroid ultrasound. If patient is clinically hyperthyroid, consider nuclear medicine thyroid uptake and scan   09/29/2017 - 10/11/2017 Radiation Therapy   Pt received 27 Gy in 9 fractions directed at the areas of significant uptake noted on recent bone scan the right pelvis and right proximal femur, with Dr, KinaSondra Come10/09/2017 - 04/07/2018 Chemotherapy   -Herceptin every 3 weeks starting 02/08/17, perjeta added on 10/20/2017  -weekly Taxol started on 10/21/2017. Will reduce Taxol to 2 weeks on, 1 week off starting on 02/04/17.  Stoped due to mild disease progression  10/27/2017 Imaging   10/27/2017 CT AP IMPRESSION: 1. No acute abnormality. No evidence of bowel obstruction or acute bowel inflammation. Normal appendix. 2. Stable patchy sclerotic osseous  metastases throughout the visualized skeleton. No new or progressive metastatic disease in the abdomen or pelvis.   01/11/2018 Imaging   Whole Body Bone Scan 01/11/18  IMPRESSION: 1. Multifocal osseous metastases as described. 2. Right scapular metastasis. 3. Right superior thoracic spinous process. 4. Right sacral ala and right L5 metastases. 5. Right acetabular metastasis. 6. Right proximal femur metastasis. 7. Diffuse metastases throughout the left femur. 8. Anterior right fourth rib metastasis.   01/11/2018 Imaging   CT CAP W Contrast 01/11/18  IMPRESSION: 1. Similar-appearing osseous metastatic disease. 2. No evidence for progressed metastatic disease in the chest, abdomen or pelvis.   03/30/2018 Imaging   Bone Scan 03/30/18  IMPRESSION: Stable multifocal bony metastatic disease.   03/30/2018 Imaging    CT CAP W Contrast 03/30/18 IMPRESSION: 1. New mild contiguous wall thickening with associated mucosal hyperenhancement throughout the left colon and rectum, suggesting nonspecific infectious or inflammatory proctocolitis. Differential includes C diff colitis. 2. Stable faint patchy sclerosis in the iliac bones and lumbar vertebral bodies. No new or progressive osseous metastatic disease by CT. Please note that the osseous lesions are relatively occult by CT and would be better evaluated by PET-CT, as clinically warranted. 3. No evidence of extra osseous metastatic disease.  These results will be called to the ordering clinician or representative by the Radiologist Assistant, and communication documented in the PACS or zVision Dashboard.   04/15/2018 - 06/16/2018 Chemotherapy   Enhertu q3weeks starting 04/15/18. Stopped Enhertu after cycle 3 on 05/27/18 due to poor toleration due to poor toleration with anorexia, weight loss, N&V, diarrhea   04/21/2018 PET scan   PET 04/21/18  IMPRESSION: 1. Overall marked interval improvement in bony hypermetabolic disease. All of the  previously identified hypermetabolic osseous metastases have decreased in hypermetabolism in the interval. There is a new tiny focus of hypermetabolism in the right aspect of the L3 vertebral body that appears to correspond to a new 8 mm lucency, raising concern for new metastatic disease. Close attention on follow-up recommended. 2. Focal hypermetabolism in the central spinal canal at the level of L1 previously has resolved. 3. No hypermetabolic soft tissue disease on today's study. 4. Right thyroid nodule seen previously has decreased in the interval.    06/16/2018 Imaging   CT LUMBAR SPINE WO CONTRAST 06/16/18 IMPRESSION: 1. Transitional lumbosacral anatomy. Correlation with radiographs is recommended prior to any operative intervention. 2. Suspicion of a right L3 vertebral body metastasis on the April PET-CT is not evident by CT. Stable degenerative versus metastatic lesion at the right L5 superior endplate. Small but increased right iliac bone metastasis since March. Lumbar MRI would best characterize lumbar metastatic disease (without contrast if necessary). 3. Suspect L2-L3 left subarticular segment disc herniation. Query left L2 radiculitis. 4. Stable appearance of rightward L4-L5 disc degeneration which could affect the right foramen and right lateral recess.   06/17/2018 - 11/16/2018 Chemotherapy   Restart Taxol 2 weeks on/1 week off with Herceptin and Perjeta q3weeks on 06/17/18. Perjeta was stopped after C5 due to diarrhea. Stopped Taxol after 11/16/18 due to disease progression to L3.    07/06/2018 PET scan   PET  IMPRESSION: 1. Near total resolution of hypermetabolic activity associated with skeletal metastasis. Very mild activity associated with the LEFT iliac bone which is similar to background blood pool activity. 2. No new   or progressive disease.   11/21/2018 PET scan   IMPRESSION: 1. New focal hypermetabolic lesion eccentric to the right in the L3 vertebral body,  maximum SUV 6.1 compatible with a new focus of active malignancy. 2. Very low-grade activity similar to blood pool in previous existing mildly sclerotic metastatic lesions including the left iliac bone lesion. Some of the mildly sclerotic lesions are not hypermetabolic compatible with effectively treated bony metastatic disease. 3. No hypermetabolic soft tissue lesions are identified.     11/30/2018 - 08/25/2019 Chemotherapy   Continue Herceptin q3weeks. Stopped 08/25/19 due to significant disease progression and switched to  IV Matuzumab   12/12/2018 - 12/17/2018 Chemotherapy   Tucatinib (TUKYSA) 6 tabs BID as a next line therapy starting 12/12/18. Stopped 12/17/18 duet o N&V, poor appetite and HA    12/21/2018 - 03/17/2019 Chemotherapy   IV Eribulin on day 1, 8 every 21 days starting 12/21/18. Stopped 03/17/19 due to mixed response to treatment.    03/21/2019 PET scan   IMPRESSION: 1. Mixed response to therapy of osseous metastasis. Although a right-sided L4 lesion demonstrates decreased hypermetabolism today, new left iliac and right ischial hypermetabolic lesions are seen. 2. No evidence of hypermetabolic soft tissue metastasis.     03/27/2019 - 07/21/2019 Chemotherapy   Neratinib (Nerlynx) titrate up from 120mg to 240mg starting 03/27/19. She could not tolerate 160mg and stopped on 02/05/19 due to N&V and diarrhea. Will restart at 120mg on 04/10/18-04/27/19 and 4/19-4/22. Reduced to 80mg once daily on 05/10/19. Held/stopped since 07/21/19 due to dry cough, Nausea and body aches.   06/23/2019 Imaging   CT Head  IMPRESSION: 1. No acute intracranial abnormality or metastatic disease identified. 2. Chronic white matter disease in the right hemisphere is stable from prior exams.     09/05/2019 PET scan   IMPRESSION: 1. Significant progression of widespread hypermetabolic osseous metastases throughout the axial skeleton as detailed. 2. No hypermetabolic extraosseous sites of metastatic disease.    09/15/2019 - 12/22/2019 Chemotherapy   -IV Margetuximab q3weeks and IV Gemcitabine 2 weeks on/1 week off starting 09/15/19. Carboplatin added to Gemcitabine starting with C3 on 11/03/19. D/c after C5 due to disease progression in bone.    12/13/2019 Imaging   PET  IMPRESSION: 1. Interval progression of widespread, FDG avid osseous metastasis involving the axial skeleton. 2. No FDG avid extra osseous sites of metastatic disease.     01/19/2020 -  Chemotherapy   -Xeloda BID 2 weeks on/1 week off and Herceptin q3weeks starting 01/19/20. C1 at 1000mg BID. C2 increased to 1500mg in the AM and 1000mg in the PM starting 02/12/20.   04/09/2020 Imaging   CT CAP  IMPRESSION: 1. New 3 mm RIGHT upper lobe nodule, nonspecific, attention on follow-up. 2. What may represent an incomplete periprosthetic fracture about the cephalo medullary rod in the LEFT proximal femur. This linear band of incomplete bony fusion that is more pronounced than on previous CT imaging that was acquired in 2017 and may represent a stress type fracture about hardware. Correlate with any pain in this area. Sclerotic margins show chronic features but the linear band passing through the bone around the intramedullary rod is more extensive, involving approximately 270 degrees of the circumference of the femur, medial cortex remains intact. 3. Other findings of bony metastatic disease are similar to prior imaging. Sclerosis and some periosteal reaction about a nondisplaced pathologic fracture of the LEFT lateral seventh rib. 4. Small hypervascular focus in the RIGHT hepatic lobe, indistinct but new compared to   previous imaging. Perhaps this represents a transient phenomenon though is not imaged on the later phase to allow for confirmation. Liver MRI may be helpful for further evaluation, to exclude underlying metastatic lesion as clinically warranted. 5. Hepatic steatosis. 6. Aortic atherosclerosis.   Aortic Atherosclerosis  (ICD10-I70.0).        CURRENT THERAPY:  -Zometa q27months -Xeloda BID 2 weeks on/1 week off and Herceptinq3weeks starting1/7/22.C1 at 1000mg  BID.C2 increased to 1500mg  in the AM and 1000mg  in the [place] 02/12/20.Held since 03/22/20 due to hyperbilirubinemia.restart at 1000mg  bid 7 days on and 7 days off on4/08/2020  INTERVAL HISTORY:  [name] is here for a follow up. She was last seen by me 05/17/20. She presents to the clinic with her interpretor. She presents to the clinic with her interpretor. She notes she is doing well. She notes no new changes. She is overall stable with fatigue. She is able to do most of things she needs to do. I reviewed her medication list with her. She needs refill of Ambien. She notes she has not been needing to use pain medication much lately. Her hip pain is manageable. She notes she did have an episode of bad headache 2 days ago.  She plans to start new cycle Xeloda today at 1000mg  BID for 1 week on/1 week off. Her urine color has been normal lately. She notes she plans to travel to [place] in June from 06/29/20.     REVIEW OF SYSTEMS:   Constitutional: Denies fevers, chills or abnormal weight loss Eyes: Denies blurriness of vision Ears, nose, mouth, throat, and face: Denies mucositis or sore throat Respiratory: Denies cough, dyspnea or wheezes Cardiovascular: Denies palpitation, chest discomfort or lower extremity swelling Gastrointestinal:  Denies nausea, heartburn or change in bowel habits Skin: Denies abnormal skin rashes Lymphatics: Denies new lymphadenopathy or easy bruising Neurological:Denies numbness, tingling or new weaknesses Behavioral/Psych: Mood is stable, no new changes  All other systems were reviewed with the patient and are negative.  MEDICAL HISTORY:  Past Medical History:  Diagnosis Date  . Abnormal Pap smear   . Anxiety   . Breast cancer ([place])   . Cancer ([place]) 1999   breast-s/p mastectomy, chemo, rad  . CIN I  (cervical intraepithelial neoplasia I) 2003   by colpo  . Congenital deafness   . Deaf   . Depression   . Port-A-Cath in place 2017   for chemotherapy  . Radiation 07/12/15-07/26/15   left femur 30 Gy  . S/P bilateral oophorectomy     SURGICAL HISTORY: Past Surgical History:  Procedure Laterality Date  . ABDOMINAL HYSTERECTOMY    . INTRAMEDULLARY (IM) NAIL INTERTROCHANTERIC Left 06/26/2015   Procedure: LEFT BIOMET LONG AFFIXS NAIL;  Surgeon: [name], MD;  Location: [facility];  Service: Orthopedics;  Laterality: Left;  . IR GENERIC HISTORICAL  08/06/2015   IR US GUIDE VASC ACCESS LEFT 08/06/2015 Aletta Edouard, MD [facility]  . IR GENERIC HISTORICAL  08/06/2015   IR FLUORO GUIDE CV LINE LEFT 08/06/2015 Aletta Edouard, MD [facility]  . IR GENERIC HISTORICAL  03/05/2016   IR CV LINE INJECTION 03/05/2016 Markus Daft, MD [facility]  . LEFT HEART CATH AND CORONARY ANGIOGRAPHY N/A 12/13/2017   Procedure: LEFT HEART CATH AND CORONARY ANGIOGRAPHY;  Surgeon: Leonie Man, MD;  Location: [facility];  Service: Cardiovascular;  Laterality: N/A;  . MASTECTOMY     right breast  . MASTECTOMY    . OVARIAN CYST REMOVAL    .  RADIOLOGY WITH ANESTHESIA Left 06/25/2015   Procedure: MRI OF LEFT HIP WITH OR WITHOUT CONTRAST;  Surgeon: Medication Radiologist, MD;  Location: [facility];  Service: Radiology;  Laterality: Left;  DR. MCINTYRE/MRI  . TUBAL LIGATION      I have reviewed the social history and family history with the patient and they are unchanged from previous note.  ALLERGIES:  is allergic to promethazine hcl, chlorhexidine, and diphenhydramine hcl.  MEDICATIONS:  Current Outpatient Medications  Medication Sig Dispense Refill  . ALPRAZolam (XANAX) 0.25 MG tablet Take 1 tablet (0.25 mg total) by mouth 2 (two) times daily as needed for anxiety. 30 tablet 0  . capecitabine (XELODA) 500 MG tablet TAKE 2 TABS IN MORNING, AND 2 TABS IN EVENING. TAKE FOR 7 DAYS ON, 7 DAYS OFF. REPEAT EVERY  14 DAYS. START ON 04/19/2020 56 tablet 1  . diphenoxylate-atropine (LOMOTIL) 2.5-0.025 MG tablet Take 1-2 tablets by mouth 4 (four) times daily as needed for diarrhea or loose stools. 60 tablet 1  . lidocaine-prilocaine (EMLA) cream Apply 1 application topically as needed. Apply to Porta-Cath 1-2 hours prior to access as directed. 90 g 2  . ondansetron (ZOFRAN) 8 MG tablet Take 1 tablet (8 mg total) by mouth every 8 (eight) hours as needed for nausea or vomiting. 45 tablet 1  . oxyCODONE (ROXICODONE) 5 MG immediate release tablet Take 1 tablet (5 mg total) by mouth every 8 (eight) hours as needed for severe pain. Refill on 02/17/2020 90 tablet 0  . potassium chloride (KLOR-CON) 10 MEQ tablet Take 1 tablet (10 mEq total) by mouth 3 (three) times daily. 90 tablet 2  . prochlorperazine (COMPAZINE) 10 MG tablet Take 1 tablet (10 mg total) by mouth 2 (two) times daily as needed for nausea or vomiting. 20 tablet 1  . zolpidem (AMBIEN) 10 MG tablet Take 1 tablet (10 mg total) by mouth at bedtime as needed for sleep. 15 tablet 2   No current facility-administered medications for this visit.   Facility-Administered Medications Ordered in Other Visits  Medication Dose Route Frequency Provider Last Rate Last Admin  . potassium chloride SA (K-DUR) CR tablet 40 mEq  40 mEq Oral Once Truitt Merle, MD      . sodium chloride 0.9 % 1,000 mL with potassium chloride 10 mEq infusion   Intravenous Continuous Gordy Levan, MD   Stopped at 09/10/15 1653  . sodium chloride 0.9 % injection 10 mL  10 mL Intravenous PRN Livesay, Lennis P, MD        PHYSICAL EXAMINATION: ECOG PERFORMANCE STATUS: 1 - Symptomatic but completely ambulatory  Vitals:   05/31/20 0805  BP: 125/83  Pulse: 70  Resp: 17  Temp: 97.8 F (36.6 C)  SpO2: 99%   Filed Weights   05/31/20 0805  Weight: 136 lb 14.4 oz (62.1 kg)    GENERAL:alert, no distress and comfortable SKIN: skin color, texture, turgor are normal, no rashes or significant  lesions EYES: normal, Conjunctiva are pink and non-injected, sclera clear  NECK: supple, thyroid normal size, non-tender, without nodularity LYMPH:  no palpable lymphadenopathy in the cervical, axillary  LUNGS: clear to auscultation and percussion with normal breathing effort HEART: regular rate & rhythm and no murmurs and no lower extremity edema ABDOMEN:abdomen soft, non-tender and normal bowel sounds Musculoskeletal:no cyanosis of digits and no clubbing  NEURO: alert & oriented x 3 with fluent speech, no focal motor/sensory deficits  LABORATORY DATA:  I have reviewed the data as listed CBC Latest Ref Rng &  Units 05/31/2020 05/17/2020 05/03/2020  WBC 4.0 - 10.5 K/uL 3.8(L) 3.9(L) 3.9(L)  Hemoglobin 12.0 - 15.0 g/dL 10.8(L) 11.0(L) 11.0(L)  Hematocrit 36.0 - 46.0 % 31.2(L) 32.2(L) 31.9(L)  Platelets 150 - 400 K/uL 157 172 167     CMP Latest Ref Rng & Units 05/17/2020 05/03/2020 04/12/2020  Glucose 70 - 99 mg/dL 113(H) 88 84  BUN 6 - 20 mg/dL 11 9 13   Creatinine 0.44 - 1.00 mg/dL 0.99 0.88 0.91  Sodium 135 - 145 mmol/L 143 143 141  Potassium 3.5 - 5.1 mmol/L 3.1(L) 3.3(L) 3.6  Chloride 98 - 111 mmol/L 107 107 105  CO2 22 - 32 mmol/L 24 25 26   Calcium 8.9 - 10.3 mg/dL 9.1 8.7(L) 8.6(L)  Total Protein 6.5 - 8.1 g/dL 6.6 6.4(L) 6.8  Total Bilirubin 0.3 - 1.2 mg/dL 2.0(H) 2.6(H) 2.5(H)  Alkaline Phos 38 - 126 U/L 69 63 76  AST 15 - 41 U/L 16 15 18   ALT 0 - 44 U/L 11 9 19       RADIOGRAPHIC STUDIES: I have personally reviewed the radiological images as listed and agreed with the findings in the report. No results found.   ASSESSMENT & PLAN:  SHENIQUA CAROLAN is a 55 y.o. 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 She had right mastectomy, 4 cycles of AC-T and 5 years of Tamoxifen/AI.  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 -After her  first line ofchemotreatment and initial maintenance therapy, she progressed or had poor toleration of multiple lines of treatment since then. -Based on 12/13/19 PET, she progressed diffusely in the bone on7th linetherapywith anti-HER2IV Margetuximabq3weekswithIV Chemo weeklyGemcitabine(Carboplatin added C3)2 weeks on/1 week offafter 5 cycles,09/15/19-12/22/19. -Ipreviouslydiscussed the longer she is on treatment the less treatment options and efficacy she will have. -Istarted her onXeloda 1000mg  BID 2 weeks on/1 week off for first cycle on 01/19/20.Continue Herceptin q3weeks.Increased to1500mg  in the AM and1000mg in the [place].Given worsened hyperbilirubinemia, Xeloda was held 04/01/20-04/19/20 then reduced to 1000mg  BID 1 week on/1 week off.  -Her liver function is stable on Xeloda. She is overall stable with no major issues recently. Labs reviewed, WBC 3.8, Hg 10.8. Overall adequate to proceed with Herceptin today. She will start current cycle Xeloda today.  -Plan to scan her in June after her travels.  -f/u in 2 weeks.    2.Body pain, secondary to bone mets, Hypocalcemia, leg cramps  -She is being treated with Zometa q33months. -She will continue oral calcium and Vit Dand Prilosec for Acid Reflux. -Current pain includes, lower back, upper legs,lefthip and rightshoulder.PET12/3/21does indicate disease progression in her spine, pelvis and right scapula.She also has headaches occasionally. -Her 03/20/20 CT CAP shows possible fracture around pin of left hip from prior surgery.She was evaluated by orthopedic surgeon Dr. Calton Dach recommended conservative measurements -Her pain has improved lately and well controlled She has not required much oxycodone lately.   3.Congenital deafness  4.Mild anemia, secondary to chemotherapy and malignancy -Moderate andStable   5.Hypokalemia -She developed diarrhea s/p cycle 3 Perjetaand again with Nerlynx, Both have  been D/c. -She is onOral potassium 53meq2 tabs daily. Will increase to 3 tabs daily. I encouraged her to increase potassium in diet.  6.Goal of care discussion, DNR/DNI -Wepreviouslyreviewed goal of care andI recommend DNR/DNI, shepreviouslyagreed.  -I encouraged her to start setting up her living will.We previously discussed hospice care if she has  no more treatment available or could not tolerate more treatment  7.Insomnia -She cannot take Benadryl due to allergy reaction. She has tried trazodone with no relief.  -She is on Xanax andonAmbien10mg at half tabletsnightly. She knows not to take both at the same time. If she needs Oxycodone at night, she can use half tabletonly.  -Stable and well controlled.   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 Continue monitoring. Stable.  9. Mildhyperbilirubinemia  -likely second to chemotherapy, fluctuates -Worsened on 03/22/20 withtotal bilirubin 3.2. Increased again to 3.7 on 04/01/20. -There is subtle change in liver on her 04/09/20 CT CAP, but she declined MRI Liver for further evaluation.  -Stable Liver function since I restarted her on low dose Xeloda   PLAN: -I refilled Ambien and Xeloda today  -Labs reviewed,will proceed withHerceptin todayat 4mg /kg and continue every 2 weeks. -next cycleXelodaat1000 mg twice daily, 7 days on and 7 days off, starting today -Lab, flush, follow-up and Herceptin in2weeks -PET san on 7/7    No problem-specific Assessment & Plan notes found for this encounter.   Orders Placed This Encounter  Procedures  . NM PET Image Restag (PS) Skull Base To Thigh    Standing Status:   Future    Standing Expiration Date:   05/31/2021    Order Specific Question:   If indicated for the ordered procedure, I authorize the administration of a radiopharmaceutical per Radiology protocol    Answer:   Yes    Order Specific Question:   Is the patient pregnant?    Answer:   No    Order Specific  Question:   Preferred imaging location?    Answer:   [facility]   All questions were answered. The patient knows to call the clinic with any problems, questions or concerns. No barriers to learning was detected. The total time spent in the appointment was 30 minutes.     Truitt Merle, MD 05/31/2020   I, Joslyn Devon, am acting as scribe for Truitt Merle, MD.   I have reviewed the above documentation for accuracy and completeness, and I agree with the above.

## 2020-05-29 ENCOUNTER — Other Ambulatory Visit (HOSPITAL_COMMUNITY): Payer: Self-pay

## 2020-05-31 ENCOUNTER — Encounter: Payer: Self-pay | Admitting: Hematology

## 2020-05-31 ENCOUNTER — Inpatient Hospital Stay (HOSPITAL_BASED_OUTPATIENT_CLINIC_OR_DEPARTMENT_OTHER): Payer: Medicaid Other | Admitting: Hematology

## 2020-05-31 ENCOUNTER — Inpatient Hospital Stay: Payer: Medicaid Other

## 2020-05-31 ENCOUNTER — Other Ambulatory Visit: Payer: Self-pay

## 2020-05-31 VITALS — BP 125/83 | HR 70 | Temp 97.8°F | Resp 17 | Wt 136.9 lb

## 2020-05-31 DIAGNOSIS — Z95828 Presence of other vascular implants and grafts: Secondary | ICD-10-CM

## 2020-05-31 DIAGNOSIS — C7951 Secondary malignant neoplasm of bone: Secondary | ICD-10-CM

## 2020-05-31 DIAGNOSIS — C50911 Malignant neoplasm of unspecified site of right female breast: Secondary | ICD-10-CM

## 2020-05-31 DIAGNOSIS — Z5112 Encounter for antineoplastic immunotherapy: Secondary | ICD-10-CM | POA: Diagnosis not present

## 2020-05-31 DIAGNOSIS — G4700 Insomnia, unspecified: Secondary | ICD-10-CM | POA: Diagnosis not present

## 2020-05-31 LAB — CBC WITH DIFFERENTIAL (CANCER CENTER ONLY)
Abs Immature Granulocytes: 0.01 10*3/uL (ref 0.00–0.07)
Basophils Absolute: 0 10*3/uL (ref 0.0–0.1)
Basophils Relative: 1 %
Eosinophils Absolute: 0.1 10*3/uL (ref 0.0–0.5)
Eosinophils Relative: 3 %
HCT: 31.2 % — ABNORMAL LOW (ref 36.0–46.0)
Hemoglobin: 10.8 g/dL — ABNORMAL LOW (ref 12.0–15.0)
Immature Granulocytes: 0 %
Lymphocytes Relative: 34 %
Lymphs Abs: 1.3 10*3/uL (ref 0.7–4.0)
MCH: 33.4 pg (ref 26.0–34.0)
MCHC: 34.6 g/dL (ref 30.0–36.0)
MCV: 96.6 fL (ref 80.0–100.0)
Monocytes Absolute: 0.3 10*3/uL (ref 0.1–1.0)
Monocytes Relative: 8 %
Neutro Abs: 2 10*3/uL (ref 1.7–7.7)
Neutrophils Relative %: 54 %
Platelet Count: 157 10*3/uL (ref 150–400)
RBC: 3.23 MIL/uL — ABNORMAL LOW (ref 3.87–5.11)
RDW: 14.4 % (ref 11.5–15.5)
WBC Count: 3.8 10*3/uL — ABNORMAL LOW (ref 4.0–10.5)
nRBC: 0 % (ref 0.0–0.2)

## 2020-05-31 LAB — CMP (CANCER CENTER ONLY)
ALT: 11 U/L (ref 0–44)
AST: 16 U/L (ref 15–41)
Albumin: 3.7 g/dL (ref 3.5–5.0)
Alkaline Phosphatase: 68 U/L (ref 38–126)
Anion gap: 8 (ref 5–15)
BUN: 12 mg/dL (ref 6–20)
CO2: 26 mmol/L (ref 22–32)
Calcium: 9.3 mg/dL (ref 8.9–10.3)
Chloride: 106 mmol/L (ref 98–111)
Creatinine: 0.88 mg/dL (ref 0.44–1.00)
GFR, Estimated: >60 mL/min (ref >60)
Glucose, Bld: 90 mg/dL (ref 70–99)
Potassium: 3.7 mmol/L (ref 3.5–5.1)
Sodium: 140 mmol/L (ref 135–145)
Total Bilirubin: 2 mg/dL — ABNORMAL HIGH (ref 0.3–1.2)
Total Protein: 6.4 g/dL — ABNORMAL LOW (ref 6.5–8.1)

## 2020-05-31 MED ORDER — LORATADINE 10 MG PO TABS
10.0000 mg | ORAL_TABLET | Freq: Once | ORAL | Status: AC
  Administered 2020-05-31: 10 mg via ORAL

## 2020-05-31 MED ORDER — SODIUM CHLORIDE 0.9 % IJ SOLN: 10.0000 mL | INTRAMUSCULAR | Status: DC | PRN

## 2020-05-31 MED ORDER — ZOLPIDEM TARTRATE 10 MG PO TABS: 10.0000 mg | ORAL_TABLET | Freq: Every evening | ORAL | 2 refills | Status: DC | PRN

## 2020-05-31 MED ORDER — SODIUM CHLORIDE 0.9% FLUSH
10.0000 mL | INTRAVENOUS | Status: DC | PRN
  Administered 2020-05-31: 10 mL via INTRAVENOUS
  Filled 2020-05-31: qty 10

## 2020-05-31 MED ORDER — SODIUM CHLORIDE 0.9% FLUSH
10.0000 mL | Freq: Once | INTRAVENOUS | Status: DC
  Filled 2020-05-31: qty 10

## 2020-05-31 MED ORDER — ACETAMINOPHEN 325 MG PO TABS
650.0000 mg | ORAL_TABLET | Freq: Once | ORAL | Status: AC
  Administered 2020-05-31: 650 mg via ORAL

## 2020-05-31 MED ORDER — ACETAMINOPHEN 325 MG PO TABS
ORAL_TABLET | ORAL | Status: AC
  Filled 2020-05-31: qty 2

## 2020-05-31 MED ORDER — LORATADINE 10 MG PO TABS
ORAL_TABLET | ORAL | Status: AC
  Filled 2020-05-31: qty 1

## 2020-05-31 MED ORDER — TRASTUZUMAB-DKST CHEMO 150 MG IV SOLR
4.0000 mg/kg | Freq: Once | INTRAVENOUS | Status: AC
  Administered 2020-05-31: 252 mg via INTRAVENOUS
  Filled 2020-05-31: qty 12

## 2020-05-31 MED ORDER — SODIUM CHLORIDE 0.9 % IV SOLN
Freq: Once | INTRAVENOUS | Status: AC
Start: 2020-05-31 — End: 2020-05-31
  Filled 2020-05-31: qty 250

## 2020-05-31 MED ORDER — SODIUM CHLORIDE 0.9% FLUSH
10.0000 mL | INTRAVENOUS | Status: DC | PRN
  Administered 2020-05-31: 10 mL
  Filled 2020-05-31: qty 10

## 2020-05-31 MED ORDER — HEPARIN SOD (PORK) LOCK FLUSH 100 UNIT/ML IV SOLN
500.0000 [IU] | Freq: Once | INTRAVENOUS | Status: AC | PRN
  Administered 2020-05-31: 500 [IU]
  Filled 2020-05-31: qty 5

## 2020-05-31 NOTE — Patient Instructions (Signed)
[facility]  Discharge Instructions: Thank you for choosing [facility] to provide your oncology and hematology care.   If you have a lab appointment with the Cancer Center, please go directly to the Cancer Center and check in at the registration area.   Wear comfortable clothing and clothing appropriate for easy access to any Portacath or PICC line.   We strive to give you quality time with your provider. You may need to reschedule your appointment if you arrive late (15 or more minutes).  Arriving late affects you and other patients whose appointments are after yours.  Also, if you miss three or more appointments without notifying the office, you may be dismissed from the clinic at the provider's discretion.      For prescription refill requests, have your pharmacy contact our office and allow 72 hours for refills to be completed.    Today you received the following chemotherapy and/or immunotherapy agents trastuzumab      To help prevent nausea and vomiting after your treatment, we encourage you to take your nausea medication as directed.  BELOW ARE SYMPTOMS THAT SHOULD BE REPORTED IMMEDIATELY: *FEVER GREATER THAN 100.4 F (38 C) OR HIGHER *CHILLS OR SWEATING *NAUSEA AND VOMITING THAT IS NOT CONTROLLED WITH YOUR NAUSEA MEDICATION *UNUSUAL SHORTNESS OF BREATH *UNUSUAL BRUISING OR BLEEDING *URINARY PROBLEMS (pain or burning when urinating, or frequent urination) *BOWEL PROBLEMS (unusual diarrhea, constipation, pain near the anus) TENDERNESS IN MOUTH AND THROAT WITH OR WITHOUT PRESENCE OF ULCERS (sore throat, sores in mouth, or a toothache) UNUSUAL RASH, SWELLING OR PAIN  UNUSUAL VAGINAL DISCHARGE OR ITCHING   Items with * indicate a potential emergency and should be followed up as soon as possible or go to the Emergency Department if any problems should occur.  Please show the CHEMOTHERAPY ALERT CARD or IMMUNOTHERAPY ALERT CARD at check-in to  the Emergency Department and triage nurse.  Should you have questions after your visit or need to cancel or reschedule your appointment, please contact [facility]  Dept: [phone]  and follow the prompts.  Office hours are 8:00 a.m. to 4:30 p.m. Monday - Friday. Please note that voicemails left after 4:00 p.m. may not be returned until the following business day.  We are closed weekends and major holidays. You have access to a nurse at all times for urgent questions. Please call the main number to the clinic Dept: [phone] and follow the prompts.   For any non-urgent questions, you may also contact your provider using MyChart. We now offer e-Visits for anyone 18 and older to request care online for non-urgent symptoms. For details visit [email or web address].   Also download the MyChart app! Go to the app store, search "MyChart", open the app, select [facility], and log in with your MyChart username and password.  Due to Covid, a mask is required upon entering the hospital/clinic. If you do not have a mask, one will be given to you upon arrival. For doctor visits, patients may have 1 support person aged 18 or older with them. For treatment visits, patients cannot have anyone with them due to current Covid guidelines and our immunocompromised population.   

## 2020-06-07 NOTE — Progress Notes (Signed)
[facility]   [phone] [phone]   Clinic Follow up Note   Patient Care Team: [name], [name], [name] as PCP - General (Physician Assistant) Eldred Manges, MD as Consulting Physician (Orthopedic Surgery)  Date of Service:  06/14/2020  CHIEF COMPLAINT: F/u of metastatic breast cancer  SUMMARY OF ONCOLOGIC HISTORY: Oncology History Overview Note   Cancer Staging No matching staging information was found for the patient.     [name], right (HCC)  11/1997 Cancer Diagnosis   Patient had 1.4 cm poorly differentiated right breast carcinoma diagnosed in Nov 1999 at age 55, 1/7 axillary nodes involved, ER/PR and HER 2 positive. She had mastectomy with the 7 axillary node evaluation, 4 cycles of adriamycin/ cytoxan followed by taxotere, then five years of tamoxifen thru June 2005 (no herceptin in 1999). She had bilateral oophorectomy in May 2005. She was briefly on aromatase inhibitor after tamoxifen, but discontinued this herself due to poor tolerance.   04/04/2015 Genetic Testing   Negative genetic testing on the breast/ovarian cancer pnael.  The Breast/Ovarian gene panel offered by [employer] includes sequencing and rearrangement analysis for the following 20 genes:  ATM, BARD1, BRCA1, BRCA2, BRIP1, CDH1, CHEK2, EPCAM, FANCC, MLH1, MSH2, MSH6, NBN, PALB2, PMS2, PTEN, RAD51C, RAD51D, TP53, and XRCC2.   06/26/2015 Pathology Results   Initial Path Report Bone, curettage, Left femur met, intramedullary subtroch tissue - METASTATIC ADENOCARCINOMA.  Estrogen Receptor: 95%, POSITIVE, STRONG STAINING INTENSITY Progesterone Receptor: 2%, POSITIVE, STRONG  HER2 (+), IHC 3+   06/26/2015 Surgery   Biopsy of metastatic tissue and stabilization with Affixus trochanteric nail, proximal and distal interlock of left femur by Dr. Ophelia Charter    06/28/2015 Imaging   CT Chest w/ Contrast 1. Extensive right internal mammary chain  lymphadenopathy consistent with metastatic breast cancer. 2. Lytic metastases involving the posterior elements at T1 with probable nondisplaced pathologic fracture of the spinous process. 3. No other evidence of thoracic metastatic disease. 4. Trace bilateral pleural effusions with associated bibasilar atelectasis. 5. Indeterminate right thyroid nodule, not previously imaged. This could be evaluated with thyroid ultrasound as clinically warranted.   06/29/2015 -  Chemotherapy   Zometa started monthly on 06/29/15 and switched to q18months from 01/08/17   07/01/2015 Imaging   Bone Scan 1. Increased activity noted throughout the left femur. Although metastatic disease cannot be excluded, these changes are most likely secondary to prior surgery. 2. No other focal abnormalities identified to suggest metastatic disease.   07/12/2015 - 07/26/2015 Radiation Therapy   Left femur, 30 Gy in 10 fractions by Dr. Roselind Messier    07/14/2015 Initial Diagnosis   [name], right (HCC)   07/19/2015 Imaging   Initial PET Scan 1. Severe multifocal osseous metastatic disease, much greater than anticipated based on prior imaging. 2. Multifocal nodal metastases involving the right internal mammary, prevascular and subpectoral lymph nodes. No axillary pulmonary involvement identified. 3. No distant extra osseous metastases.   07/30/2015 - 11/29/2015 Chemotherapy   Docetaxel, Herceptin and Perjeta every 3 weeks X6 cycles, pt tolerated moderately well, restaging scan showed excellent response    09/18/2015 Imaging   CT Head w/wo Contrast 1. No acute intracranial abnormality or significant interval change. 2. Stable minimal periventricular white matter hypoattenuation on the right. This may reflect a remote ischemic injury. 3. No evidence for metastatic disease to the brain. 4. No focal soft tissue lesion to explain the patient's tenderness.   10/30/2015 Imaging  CT Abdomen Pelvis w/  Contrast 1. Few mildly prominent fluid-filled loops of small bowel scattered throughout the abdomen, with intraluminal fluid density within the distal colon. Given the provided history, findings are suggestive of acute enteritis/diarrheal illness. 2. No other acute intra-abdominal or pelvic process identified. 3. Widespread osseous metastatic disease, better evaluated on most recent PET-CT from 07/19/2015. No pathologic fracture or other complication. 4. 11 mm hypodensity within the left kidney. This lesion measures intermediate density, and is indeterminate. While this lesion is similar in size relative to recent studies, this is increased in size relative to prior study from 2012. Further evaluation with dedicated renal mass protocol CT and/or MRI is recommended for complete characterization.   12/2015 - 05/14/2016 Chemotherapy   Maintenance Herceptin and pejeta every 3 weeks stopped on 05/14/16 due to disease progression   12/25/2015 Imaging   Restaging PET Scan 1. Markedly improved skeletal activity, with only several faint foci of residual accentuated metabolic activity, but resolution of the vast majority of the previously extensive osseous metastatic disease. 2. Resolution of the prior right internal mammary and right prevascular lymph nodes. 3. Residual subcutaneous edema and edema along fascia planes in the left upper thigh. This remains somewhat more than I would expect for placement of an IM nail 6 weeks ago. If there is leg swelling further down, consider Doppler venous ultrasound to rule out left lower extremity DVT. I do not perceive an obvious difference in density between the pelvic and common femoral veins on the noncontrast CT data. 4. Chronic right maxillary and right sphenoid sinusitis.   01/16/2016 - 10/20/2017 Anti-estrogen oral therapy   Letrozole 2.5 mg daily. She was switched to exemestane due to disease progression on 02/08/17. Stopped on 10/20/2017 when treatment changed to chemo     03/01/2016 Miscellaneous   Patient presented to ED following a fall; she reports she landed on her back and is now experiencing back pain. The patient was evaluated and discharge home same day with pain control.   05/01/2016 Imaging   CT CAP IMPRESSION: 1. Stable exam.  No new or progressive disease identified. 2. No evidence for residual or recurrent adenopathy within the chest. 3. Stable bone metastasis.   05/01/2016 Imaging   BONE SCAN IMPRESSION: 1. Small focal uptake involving the anterior rib ends of the left second and right fourth ribs, new since the prior bone scan. The location of this uptake is more suggestive of a traumatic or inflammatory etiology as opposed to metastatic disease. No other evidence of metastatic disease. 2. There are other areas of stable uptake which are most likely degenerative/reactive, including right shoulder and cervical spine uptake and uptake along the right femur adjacent to the ORIF hardware.   05/20/2016 Imaging   NM PET Skull to Thigh IMPRESSION: 1. There multiple metastatic foci in the bony pelvis, including some new abnormal foci compare to the prior PET-CT, compatible with mildly progressive bony metastatic disease. Also, a left T11 vertebral hypermetabolic metastatic lesion is observed, new compared to the prior exam. 2. No active extraosseous malignancy is currently identified. 3. Right anterior fourth rib healing fracture, appears be   06/04/2016 - 01/08/2017 Chemotherapy   Second line chemotherapy Kadcyla every 3 weeks started on 06/04/16 and stopped on 01/08/17 due to mixed response.     10/05/2016 Imaging   CT CAP and Bone Scan:  Bones: left intramedullary rod and left femoral head neck screw in place. In the proximal diaphysis of the left femur there is cortical thickening and   irregularity. No lytic or blastic bone lesions. No fracture or vertebral endplate destruction.   No definite evidence of recurrent disease in the  chest, abdomen, or pelvis.   10/28/2016 Imaging   CT A/P IMPRESSION: No acute process demonstrated in the abdomen or pelvis. No evidence of bowel obstruction or inflammation.   11/04/2016 Imaging   DG foot complete left: IMPRESSION: No fracture or dislocation.  No soft tissue abnormality   11/23/2016 Imaging   CT RIGHT FEMUR IMPRESSION: Negative CT scan of the right thigh.  No visible metastatic disease.  CT LEFT FEMUR IMPRESSION: 1. Postsurgical changes related to prior cephalomedullary rod fixation of the left femur with linear lucency along the anterolateral proximal femoral cortex at level of the lesser trochanter, suspicious for nondisplaced fracture. 2. Slightly permeative appearance of the proximal left femoral cortex at the site of known prior osseous metastases is largely unchanged. 3. Unchanged patchy lucency and sclerosis along the anterior acetabulum, which appears to correspond with an area of faint uptake on the prior PET-CT, suspicious for osseous metastasis. These results will be called to the ordering clinician or representative by the Radiologist Assistant, and communication documented in the PACS or zVision Dashboard.     01/27/2017 Imaging   IMPRESSION: 1. Mixed response to osseous metastasis. Some hypermetabolic lesions are new and increasingly hypermetabolic, while another index lesion is decreasingly hypermetabolic. 2. No evidence of hypermetabolic soft tissue metastasis.    02/08/2017 - 10/19/2017 Antibody Plan   -Herceptin every 3 week since 02/08/17 -Oral Lanpantinib 1562m and decreased to 5045mdue to diarrhea and depression. Due to disease progression she swithced to Verzenio 15035mn 05/04/17. She did not toelrate well so she was switched to Ibrance 100m50m 05/31/17. Due to her neutropenia, Ibrance dose reduced to 75 mg daily, 3 weeks on, one-week off, stopped on 10/20/2017 due to disease progression.    04/30/2017 PET scan   IMPRESSION: 1.  Primarily increase in hypermetabolism of osseous metastasis. An isolated right acetabular lesion is decreased in hypermetabolism since the prior exam. 2. No evidence of hypermetabolic soft tissue metastasis. 3. Similar non FDG avid right-sided thyroid nodule, favoring a benign etiology. Recommend attention on follow-up.   08/30/2017 PET scan   08/30/2017 PET Scan  IMPRESSION: 1. Worsening osseous metastatic disease. 2. Focal hypermetabolism in the central spinal canal the L1 level, similar to the prior exam. Metastatic implant cannot be excluded. 3. Slight enlargement of a right thyroid nodule which now appears more cystic in character. Consider further evaluation with thyroid ultrasound. If patient is clinically hyperthyroid, consider nuclear medicine thyroid uptake and scan   08/30/2017 Progression   08/30/2017 PET Scan  IMPRESSION: 1. Worsening osseous metastatic disease. 2. Focal hypermetabolism in the central spinal canal the L1 level, similar to the prior exam. Metastatic implant cannot be excluded. 3. Slight enlargement of a right thyroid nodule which now appears more cystic in character. Consider further evaluation with thyroid ultrasound. If patient is clinically hyperthyroid, consider nuclear medicine thyroid uptake and scan   09/29/2017 - 10/11/2017 Radiation Therapy   Pt received 27 Gy in 9 fractions directed at the areas of significant uptake noted on recent bone scan the right pelvis and right proximal femur, with Dr, KinaSondra Come10/09/2017 - 04/07/2018 Chemotherapy   -Herceptin every 3 weeks starting 02/08/17, perjeta added on 10/20/2017  -weekly Taxol started on 10/21/2017. Will reduce Taxol to 2 weeks on, 1 week off starting on 02/04/17.  Stoped due to mild disease progression  10/27/2017 Imaging   10/27/2017 CT AP IMPRESSION: 1. No acute abnormality. No evidence of bowel obstruction or acute bowel inflammation. Normal appendix. 2. Stable patchy sclerotic osseous  metastases throughout the visualized skeleton. No new or progressive metastatic disease in the abdomen or pelvis.   01/11/2018 Imaging   Whole Body Bone Scan 01/11/18  IMPRESSION: 1. Multifocal osseous metastases as described. 2. Right scapular metastasis. 3. Right superior thoracic spinous process. 4. Right sacral ala and right L5 metastases. 5. Right acetabular metastasis. 6. Right proximal femur metastasis. 7. Diffuse metastases throughout the left femur. 8. Anterior right fourth rib metastasis.   01/11/2018 Imaging   CT CAP W Contrast 01/11/18  IMPRESSION: 1. Similar-appearing osseous metastatic disease. 2. No evidence for progressed metastatic disease in the chest, abdomen or pelvis.   03/30/2018 Imaging   Bone Scan 03/30/18  IMPRESSION: Stable multifocal bony metastatic disease.   03/30/2018 Imaging    CT CAP W Contrast 03/30/18 IMPRESSION: 1. New mild contiguous wall thickening with associated mucosal hyperenhancement throughout the left colon and rectum, suggesting nonspecific infectious or inflammatory proctocolitis. Differential includes C diff colitis. 2. Stable faint patchy sclerosis in the iliac bones and lumbar vertebral bodies. No new or progressive osseous metastatic disease by CT. Please note that the osseous lesions are relatively occult by CT and would be better evaluated by PET-CT, as clinically warranted. 3. No evidence of extra osseous metastatic disease.  These results will be called to the ordering clinician or representative by the Radiologist Assistant, and communication documented in the PACS or zVision Dashboard.   04/15/2018 - 06/16/2018 Chemotherapy   Enhertu q3weeks starting 04/15/18. Stopped Enhertu after cycle 3 on 05/27/18 due to poor toleration due to poor toleration with anorexia, weight loss, N&V, diarrhea   04/21/2018 PET scan   PET 04/21/18  IMPRESSION: 1. Overall marked interval improvement in bony hypermetabolic disease. All of the  previously identified hypermetabolic osseous metastases have decreased in hypermetabolism in the interval. There is a new tiny focus of hypermetabolism in the right aspect of the L3 vertebral body that appears to correspond to a new 8 mm lucency, raising concern for new metastatic disease. Close attention on follow-up recommended. 2. Focal hypermetabolism in the central spinal canal at the level of L1 previously has resolved. 3. No hypermetabolic soft tissue disease on today's study. 4. Right thyroid nodule seen previously has decreased in the interval.    06/16/2018 Imaging   CT LUMBAR SPINE WO CONTRAST 06/16/18 IMPRESSION: 1. Transitional lumbosacral anatomy. Correlation with radiographs is recommended prior to any operative intervention. 2. Suspicion of a right L3 vertebral body metastasis on the April PET-CT is not evident by CT. Stable degenerative versus metastatic lesion at the right L5 superior endplate. Small but increased right iliac bone metastasis since March. Lumbar MRI would best characterize lumbar metastatic disease (without contrast if necessary). 3. Suspect L2-L3 left subarticular segment disc herniation. Query left L2 radiculitis. 4. Stable appearance of rightward L4-L5 disc degeneration which could affect the right foramen and right lateral recess.   06/17/2018 - 11/16/2018 Chemotherapy   Restart Taxol 2 weeks on/1 week off with Herceptin and Perjeta q3weeks on 06/17/18. Perjeta was stopped after C5 due to diarrhea. Stopped Taxol after 11/16/18 due to disease progression to L3.    07/06/2018 PET scan   PET  IMPRESSION: 1. Near total resolution of hypermetabolic activity associated with skeletal metastasis. Very mild activity associated with the LEFT iliac bone which is similar to background blood pool activity. 2. No new   or progressive disease.   11/21/2018 PET scan   IMPRESSION: 1. New focal hypermetabolic lesion eccentric to the right in the L3 vertebral body,  maximum SUV 6.1 compatible with a new focus of active malignancy. 2. Very low-grade activity similar to blood pool in previous existing mildly sclerotic metastatic lesions including the left iliac bone lesion. Some of the mildly sclerotic lesions are not hypermetabolic compatible with effectively treated bony metastatic disease. 3. No hypermetabolic soft tissue lesions are identified.     11/30/2018 - 08/25/2019 Chemotherapy   Continue Herceptin q3weeks. Stopped 08/25/19 due to significant disease progression and switched to  IV Matuzumab   12/12/2018 - 12/17/2018 Chemotherapy   Tucatinib (TUKYSA) 6 tabs BID as a next line therapy starting 12/12/18. Stopped 12/17/18 duet o N&V, poor appetite and HA    12/21/2018 - 03/17/2019 Chemotherapy   IV Eribulin on day 1, 8 every 21 days starting 12/21/18. Stopped 03/17/19 due to mixed response to treatment.    03/21/2019 PET scan   IMPRESSION: 1. Mixed response to therapy of osseous metastasis. Although a right-sided L4 lesion demonstrates decreased hypermetabolism today, new left iliac and right ischial hypermetabolic lesions are seen. 2. No evidence of hypermetabolic soft tissue metastasis.     03/27/2019 - 07/21/2019 Chemotherapy   Neratinib (Nerlynx) titrate up from 120mg to 240mg starting 03/27/19. She could not tolerate 160mg and stopped on 02/05/19 due to N&V and diarrhea. Will restart at 120mg on 04/10/18-04/27/19 and 4/19-4/22. Reduced to 80mg once daily on 05/10/19. Held/stopped since 07/21/19 due to dry cough, Nausea and body aches.   06/23/2019 Imaging   CT Head  IMPRESSION: 1. No acute intracranial abnormality or metastatic disease identified. 2. Chronic white matter disease in the right hemisphere is stable from prior exams.     09/05/2019 PET scan   IMPRESSION: 1. Significant progression of widespread hypermetabolic osseous metastases throughout the axial skeleton as detailed. 2. No hypermetabolic extraosseous sites of metastatic disease.    09/15/2019 - 12/22/2019 Chemotherapy   -IV Margetuximab q3weeks and IV Gemcitabine 2 weeks on/1 week off starting 09/15/19. Carboplatin added to Gemcitabine starting with C3 on 11/03/19. D/c after C5 due to disease progression in bone.    12/13/2019 Imaging   PET  IMPRESSION: 1. Interval progression of widespread, FDG avid osseous metastasis involving the axial skeleton. 2. No FDG avid extra osseous sites of metastatic disease.     01/19/2020 -  Chemotherapy   -Xeloda BID 2 weeks on/1 week off and Herceptin q3weeks starting 01/19/20. C1 at 1000mg BID. C2 increased to 1500mg in the AM and 1000mg in the PM starting 02/12/20.   04/09/2020 Imaging   CT CAP  IMPRESSION: 1. New 3 mm RIGHT upper lobe nodule, nonspecific, attention on follow-up. 2. What may represent an incomplete periprosthetic fracture about the cephalo medullary rod in the LEFT proximal femur. This linear band of incomplete bony fusion that is more pronounced than on previous CT imaging that was acquired in 2017 and may represent a stress type fracture about hardware. Correlate with any pain in this area. Sclerotic margins show chronic features but the linear band passing through the bone around the intramedullary rod is more extensive, involving approximately 270 degrees of the circumference of the femur, medial cortex remains intact. 3. Other findings of bony metastatic disease are similar to prior imaging. Sclerosis and some periosteal reaction about a nondisplaced pathologic fracture of the LEFT lateral seventh rib. 4. Small hypervascular focus in the RIGHT hepatic lobe, indistinct but new compared to   previous imaging. Perhaps this represents a transient phenomenon though is not imaged on the later phase to allow for confirmation. Liver MRI may be helpful for further evaluation, to exclude underlying metastatic lesion as clinically warranted. 5. Hepatic steatosis. 6. Aortic atherosclerosis.   Aortic Atherosclerosis  (ICD10-I70.0).        CURRENT THERAPY:  -Zometa q84months -Xeloda BID 2 weeks on/1 week off and Herceptinq3weeks starting1/7/22.C1 at 1000mg  BID.C2 increased to 1500mg  in the AM and 1000mg  in the [place] 02/12/20.Held since 03/22/20 due to hyperbilirubinemia.restart at 1000mg  bid 7 days on and 7 days off on4/08/2020  INTERVAL HISTORY:  [name] is here for a follow up. She was last seen by me 05/31/20. She presents to the clinic with her interpretor.  She is doing well overall She had one day of coughing, nausea and vomiting 4 days ago, resolved after. Her grandchildren were also sick at same time. She did not test for COVID  She is eating well, weight stable   All other systems were reviewed with the patient and are negative.  MEDICAL HISTORY:  Past Medical History:  Diagnosis Date  . Abnormal Pap smear   . Anxiety   . Breast cancer ([place])   . Cancer ([place]) 1999   breast-s/p mastectomy, chemo, rad  . CIN I (cervical intraepithelial neoplasia I) 2003   by colpo  . Congenital deafness   . Deaf   . Depression   . Port-A-Cath in place 2017   for chemotherapy  . Radiation 07/12/15-07/26/15   left femur 30 Gy  . S/P bilateral oophorectomy     SURGICAL HISTORY: Past Surgical History:  Procedure Laterality Date  . ABDOMINAL HYSTERECTOMY    . INTRAMEDULLARY (IM) NAIL INTERTROCHANTERIC Left 06/26/2015   Procedure: LEFT BIOMET LONG AFFIXS NAIL;  Surgeon: [name], MD;  Location: [facility];  Service: Orthopedics;  Laterality: Left;  . IR GENERIC HISTORICAL  08/06/2015   IR US GUIDE VASC ACCESS LEFT 08/06/2015 Aletta Edouard, MD [facility]  . IR GENERIC HISTORICAL  08/06/2015   IR FLUORO GUIDE CV LINE LEFT 08/06/2015 Aletta Edouard, MD [facility]  . IR GENERIC HISTORICAL  03/05/2016   IR CV LINE INJECTION 03/05/2016 Markus Daft, MD [facility]  . LEFT HEART CATH AND CORONARY ANGIOGRAPHY N/A 12/13/2017   Procedure: LEFT HEART CATH AND CORONARY ANGIOGRAPHY;  Surgeon:  Leonie Man, MD;  Location: [facility];  Service: Cardiovascular;  Laterality: N/A;  . MASTECTOMY     right breast  . MASTECTOMY    . OVARIAN CYST REMOVAL    . RADIOLOGY WITH ANESTHESIA Left 06/25/2015   Procedure: MRI OF LEFT HIP WITH OR WITHOUT CONTRAST;  Surgeon: Medication Radiologist, MD;  Location: [facility];  Service: Radiology;  Laterality: Left;  DR. MCINTYRE/MRI  . TUBAL LIGATION      I have reviewed the social history and family history with the patient and they are unchanged from previous note.  ALLERGIES:  is allergic to promethazine hcl, chlorhexidine, and diphenhydramine hcl.  MEDICATIONS:  Current Outpatient Medications  Medication Sig Dispense Refill  . ALPRAZolam (XANAX) 0.25 MG tablet Take 1 tablet (0.25 mg total) by mouth 2 (two) times daily as needed for anxiety. 30 tablet 0  . capecitabine (XELODA) 500 MG tablet TAKE 2 TABS IN MORNING, AND 2 TABS IN EVENING. TAKE FOR 7 DAYS ON, 7 DAYS OFF. REPEAT EVERY 14 DAYS. START ON 04/19/2020 56 tablet 1  . diphenoxylate-atropine (LOMOTIL) 2.5-0.025 MG tablet Take 1-2 tablets  by mouth 4 (four) times daily as needed for diarrhea or loose stools. 60 tablet 1  . lidocaine-prilocaine (EMLA) cream Apply 1 application topically as needed. Apply to Porta-Cath 1-2 hours prior to access as directed. 90 g 2  . ondansetron (ZOFRAN) 8 MG tablet Take 1 tablet (8 mg total) by mouth every 8 (eight) hours as needed for nausea or vomiting. 45 tablet 1  . oxyCODONE (ROXICODONE) 5 MG immediate release tablet Take 1 tablet (5 mg total) by mouth every 8 (eight) hours as needed for severe pain. Refill on 02/17/2020 90 tablet 0  . potassium chloride (KLOR-CON) 10 MEQ tablet Take 1 tablet (10 mEq total) by mouth 3 (three) times daily. 90 tablet 2  . prochlorperazine (COMPAZINE) 10 MG tablet Take 1 tablet (10 mg total) by mouth 2 (two) times daily as needed for nausea or vomiting. 20 tablet 1  . zolpidem (AMBIEN) 10 MG tablet Take 1 tablet (10 mg  total) by mouth at bedtime as needed for sleep. 30 tablet 0   No current facility-administered medications for this visit.   Facility-Administered Medications Ordered in Other Visits  Medication Dose Route Frequency Provider Last Rate Last Admin  . potassium chloride SA (K-DUR) CR tablet 40 mEq  40 mEq Oral Once Truitt Merle, MD      . sodium chloride 0.9 % 1,000 mL with potassium chloride 10 mEq infusion   Intravenous Continuous Gordy Levan, MD   Stopped at 09/10/15 1653  . sodium chloride 0.9 % injection 10 mL  10 mL Intravenous PRN Livesay, Lennis P, MD        PHYSICAL EXAMINATION: ECOG PERFORMANCE STATUS: 2 - Symptomatic, <50% confined to bed  Vitals:   06/14/20 0819  BP: 123/74  Pulse: 73  Resp: 13  Temp: (!) 97 F (36.1 C)  SpO2: 100%   Filed Weights   06/14/20 0819  Weight: 136 lb (61.7 kg)    GENERAL:alert, no distress and comfortable SKIN: skin color, texture, turgor are normal, no rashes or significant lesions EYES: normal, Conjunctiva are pink and non-injected, sclera clear NECK: supple, thyroid normal size, non-tender, without nodularity LYMPH:  no palpable lymphadenopathy in the cervical, axillary  LUNGS: clear to auscultation and percussion with normal breathing effort HEART: regular rate & rhythm and no murmurs and no lower extremity edema ABDOMEN:abdomen soft, non-tender and normal bowel sounds Musculoskeletal:no cyanosis of digits and no clubbing  NEURO: alert & oriented x 3 with fluent speech, no focal motor/sensory deficits  LABORATORY DATA:  I have reviewed the data as listed CBC Latest Ref Rng & Units 06/14/2020 05/31/2020 05/17/2020  WBC 4.0 - 10.5 K/uL 4.1 3.8(L) 3.9(L)  Hemoglobin 12.0 - 15.0 g/dL 10.9(L) 10.8(L) 11.0(L)  Hematocrit 36.0 - 46.0 % 31.1(L) 31.2(L) 32.2(L)  Platelets 150 - 400 K/uL 152 157 172     CMP Latest Ref Rng & Units 05/31/2020 05/17/2020 05/03/2020  Glucose 70 - 99 mg/dL 90 113(H) 88  BUN 6 - 20 mg/dL $Remove'12 11 9  'zttNjAb$ Creatinine 0.44  - 1.00 mg/dL 0.88 0.99 0.88  Sodium 135 - 145 mmol/L 140 143 143  Potassium 3.5 - 5.1 mmol/L 3.7 3.1(L) 3.3(L)  Chloride 98 - 111 mmol/L 106 107 107  CO2 22 - 32 mmol/L $RemoveB'26 24 25  'epMvIDfs$ Calcium 8.9 - 10.3 mg/dL 9.3 9.1 8.7(L)  Total Protein 6.5 - 8.1 g/dL 6.4(L) 6.6 6.4(L)  Total Bilirubin 0.3 - 1.2 mg/dL 2.0(H) 2.0(H) 2.6(H)  Alkaline Phos 38 - 126 U/L 68 69 63  AST 15 - 41 U/L 16 16 15   ALT 0 - 44 U/L 11 11 9       RADIOGRAPHIC STUDIES: I have personally reviewed the radiological images as listed and agreed with the findings in the report. No results found.   ASSESSMENT & PLAN:  [name] is a 55 y.o. 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 She had right mastectomy, 4 cycles of AC-T and 5 years of Tamoxifen/AI.  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 -After her first line ofchemotreatment and initial maintenance therapy, she progressed or had poor toleration of multiple lines of treatment since then. -Based on 12/13/19 PET, she progressed diffusely in the bone on7th linetherapywith anti-HER2IV Margetuximabq3weekswithIV Chemo weeklyGemcitabine(Carboplatin added C3)2 weeks on/1 week offafter 5 cycles,09/15/19-12/22/19. -Ipreviouslydiscussed the longer she is on treatment the less treatment options and efficacy she will have. -Istarted her onXeloda 1000mg  BID 2 weeks on/1 week off for first cycle on 01/19/20.Continue Herceptin q3weeks.Increased to1500mg  in the AM and1000mg in the [place].Given worsened hyperbilirubinemia, Xeloda was held 04/01/20-04/19/20 then reduced to 1000mg  BID 1 week on/1 week off. She is tolerating well  -Her liver function is stable on Xeloda.  -She is clinically stable, lab reviewed, mild anemia stable.  CMP still pending.  If adequate, will proceed Herceptin treatment today -Continue Xeloda  1 week on and 1 week off, and Herceptin  -f/u in 2 weeks.  -Due to her travel plan, plan to give her Herceptin at the 6 mg/kg on next visit.   2.Body pain, secondary to bone mets, Hypocalcemia, leg cramps  -She is being treated with Zometa q39months. -She will continue oral calcium and Vit Dand Prilosec for Acid Reflux. -Current pain includes, lower back, upper legs,lefthip and rightshoulder.PET12/3/21does indicate disease progression in her spine, pelvis and right scapula.She also has headaches occasionally. -Her 03/20/20 CT CAP shows possible fracture around pin of left hip from prior surgery.She was evaluated by orthopedic surgeon Dr. Calton Dach recommended conservative measurements -Her pain has improved lately and well controlled She has not required much oxycodone lately.   3.Congenital deafness  4.Mild anemia, secondary to chemotherapy and malignancy -Moderate andStable   5.Hypokalemia -She developed diarrhea s/p cycle 3 Perjetaand again with Nerlynx, Both have been D/c. -She is onOral potassium 60meq2 tabs daily.Will increase to 3 tabs daily.I encouraged her to increase potassium in diet.  6.Goal of care discussion, DNR/DNI -Wepreviouslyreviewed goal of care andI recommend DNR/DNI, shepreviouslyagreed.  -I encouraged her to start setting up her living will.We previously discussed hospice care if she has no more treatment available or could not tolerate more treatment  7.Insomnia -She cannot take Benadryl due to allergy reaction. She has tried trazodone with no relief.  -She is on Xanax andonAmbien10mg at half tabletsnightly. She knows not to take both at the same time. If she needs Oxycodone at night, she can use half tabletonly.  -Stable and well controlled.   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 Continue monitoring. Stable.  9. Mildhyperbilirubinemia  -likely second to chemotherapy, fluctuates -Worsened on 03/22/20 withtotal  bilirubin 3.2. Increased again to 3.7 on 04/01/20. -There is subtle change in liver on her 04/09/20 CT CAP, but she declined MRI Liver for further evaluation.  -Stable Liver function since I restarted her on low dose Xeloda   PLAN: -I refilled Ambien and Xeloda today  -Labs reviewed,will proceed withHerceptin todayat 4mg /kg and continue every 2  weeks. -next cycleXelodaat1000 mg twice daily, 7 days on and 7 days off, starting today, she will call [facility] pharmacy to pick up  -Lab, flush, follow-up and Herceptin in2weeks, plan to give 36mcg/kg next time due to her travel  -will schedule PET san on 7/7      No problem-specific Assessment & Plan notes found for this encounter.   No orders of the defined types were placed in this encounter.  All questions were answered. The patient knows to call the clinic with any problems, questions or concerns. No barriers to learning was detected. The total time spent in the appointment was 30 minutes.     Truitt Merle, MD 06/14/2020   I, Joslyn Devon, am acting as scribe for Truitt Merle, MD.   I have reviewed the above documentation for accuracy and completeness, and I agree with the above.

## 2020-06-11 ENCOUNTER — Encounter: Payer: Self-pay | Admitting: Hematology

## 2020-06-12 ENCOUNTER — Other Ambulatory Visit (HOSPITAL_COMMUNITY): Payer: Self-pay

## 2020-06-14 ENCOUNTER — Other Ambulatory Visit: Payer: Self-pay

## 2020-06-14 ENCOUNTER — Encounter: Payer: Self-pay | Admitting: Hematology

## 2020-06-14 ENCOUNTER — Inpatient Hospital Stay (HOSPITAL_BASED_OUTPATIENT_CLINIC_OR_DEPARTMENT_OTHER): Payer: Medicaid Other

## 2020-06-14 ENCOUNTER — Inpatient Hospital Stay: Payer: Medicaid Other | Attending: Hematology | Admitting: Hematology

## 2020-06-14 ENCOUNTER — Inpatient Hospital Stay: Payer: Medicaid Other

## 2020-06-14 ENCOUNTER — Other Ambulatory Visit (HOSPITAL_COMMUNITY): Payer: Self-pay

## 2020-06-14 VITALS — BP 123/74 | HR 73 | Temp 97.0°F | Resp 13 | Ht 65.0 in | Wt 136.0 lb

## 2020-06-14 DIAGNOSIS — Z95828 Presence of other vascular implants and grafts: Secondary | ICD-10-CM

## 2020-06-14 DIAGNOSIS — H905 Unspecified sensorineural hearing loss: Secondary | ICD-10-CM | POA: Diagnosis not present

## 2020-06-14 DIAGNOSIS — G620 Drug-induced polyneuropathy: Secondary | ICD-10-CM | POA: Insufficient documentation

## 2020-06-14 DIAGNOSIS — G4700 Insomnia, unspecified: Secondary | ICD-10-CM | POA: Insufficient documentation

## 2020-06-14 DIAGNOSIS — C50911 Malignant neoplasm of unspecified site of right female breast: Secondary | ICD-10-CM | POA: Insufficient documentation

## 2020-06-14 DIAGNOSIS — D6481 Anemia due to antineoplastic chemotherapy: Secondary | ICD-10-CM | POA: Insufficient documentation

## 2020-06-14 DIAGNOSIS — C7951 Secondary malignant neoplasm of bone: Secondary | ICD-10-CM

## 2020-06-14 DIAGNOSIS — Z170 Estrogen receptor positive status [ER+]: Secondary | ICD-10-CM | POA: Diagnosis not present

## 2020-06-14 DIAGNOSIS — E876 Hypokalemia: Secondary | ICD-10-CM | POA: Insufficient documentation

## 2020-06-14 DIAGNOSIS — Z23 Encounter for immunization: Secondary | ICD-10-CM | POA: Diagnosis present

## 2020-06-14 DIAGNOSIS — Z5112 Encounter for antineoplastic immunotherapy: Secondary | ICD-10-CM | POA: Diagnosis present

## 2020-06-14 LAB — CMP (CANCER CENTER ONLY)
ALT: 12 U/L (ref 0–44)
AST: 16 U/L (ref 15–41)
Albumin: 3.7 g/dL (ref 3.5–5.0)
Alkaline Phosphatase: 77 U/L (ref 38–126)
Anion gap: 10 (ref 5–15)
BUN: 9 mg/dL (ref 6–20)
CO2: 26 mmol/L (ref 22–32)
Calcium: 9.2 mg/dL (ref 8.9–10.3)
Chloride: 105 mmol/L (ref 98–111)
Creatinine: 0.86 mg/dL (ref 0.44–1.00)
GFR, Estimated: >60 mL/min (ref >60)
Glucose, Bld: 89 mg/dL (ref 70–99)
Potassium: 3.2 mmol/L — ABNORMAL LOW (ref 3.5–5.1)
Sodium: 141 mmol/L (ref 135–145)
Total Bilirubin: 2 mg/dL — ABNORMAL HIGH (ref 0.3–1.2)
Total Protein: 6.5 g/dL (ref 6.5–8.1)

## 2020-06-14 LAB — CBC WITH DIFFERENTIAL (CANCER CENTER ONLY)
Abs Immature Granulocytes: 0.01 10*3/uL (ref 0.00–0.07)
Basophils Absolute: 0 10*3/uL (ref 0.0–0.1)
Basophils Relative: 1 %
Eosinophils Absolute: 0.1 10*3/uL (ref 0.0–0.5)
Eosinophils Relative: 3 %
HCT: 31.1 % — ABNORMAL LOW (ref 36.0–46.0)
Hemoglobin: 10.9 g/dL — ABNORMAL LOW (ref 12.0–15.0)
Immature Granulocytes: 0 %
Lymphocytes Relative: 34 %
Lymphs Abs: 1.4 10*3/uL (ref 0.7–4.0)
MCH: 33.2 pg (ref 26.0–34.0)
MCHC: 35 g/dL (ref 30.0–36.0)
MCV: 94.8 fL (ref 80.0–100.0)
Monocytes Absolute: 0.4 10*3/uL (ref 0.1–1.0)
Monocytes Relative: 9 %
Neutro Abs: 2.2 10*3/uL (ref 1.7–7.7)
Neutrophils Relative %: 53 %
Platelet Count: 152 10*3/uL (ref 150–400)
RBC: 3.28 MIL/uL — ABNORMAL LOW (ref 3.87–5.11)
RDW: 14.5 % (ref 11.5–15.5)
WBC Count: 4.1 10*3/uL (ref 4.0–10.5)
nRBC: 0 % (ref 0.0–0.2)

## 2020-06-14 LAB — VITAMIN D 25 HYDROXY (VIT D DEFICIENCY, FRACTURES): Vit D, 25-Hydroxy: 27.14 ng/mL — ABNORMAL LOW (ref 30–100)

## 2020-06-14 MED ORDER — SODIUM CHLORIDE 0.9% FLUSH
10.0000 mL | INTRAVENOUS | Status: DC | PRN
  Administered 2020-06-14: 10 mL
  Filled 2020-06-14: qty 10

## 2020-06-14 MED ORDER — HEPARIN SOD (PORK) LOCK FLUSH 100 UNIT/ML IV SOLN
500.0000 [IU] | Freq: Once | INTRAVENOUS | Status: AC | PRN
  Administered 2020-06-14: 500 [IU]
  Filled 2020-06-14: qty 5

## 2020-06-14 MED ORDER — LORATADINE 10 MG PO TABS
10.0000 mg | ORAL_TABLET | Freq: Once | ORAL | Status: AC
  Administered 2020-06-14: 10 mg via ORAL

## 2020-06-14 MED ORDER — SODIUM CHLORIDE 0.9% FLUSH
10.0000 mL | INTRAVENOUS | Status: DC | PRN
  Administered 2020-06-14: 10 mL via INTRAVENOUS
  Filled 2020-06-14: qty 10

## 2020-06-14 MED ORDER — ZOLPIDEM TARTRATE 10 MG PO TABS: 10.0000 mg | ORAL_TABLET | Freq: Every evening | ORAL | 0 refills | Status: DC | PRN

## 2020-06-14 MED ORDER — ACETAMINOPHEN 325 MG PO TABS
650.0000 mg | ORAL_TABLET | Freq: Once | ORAL | Status: AC
  Administered 2020-06-14: 650 mg via ORAL

## 2020-06-14 MED ORDER — LORATADINE 10 MG PO TABS
ORAL_TABLET | ORAL | Status: AC
  Filled 2020-06-14: qty 1

## 2020-06-14 MED ORDER — ACETAMINOPHEN 325 MG PO TABS
ORAL_TABLET | ORAL | Status: AC
  Filled 2020-06-14: qty 2

## 2020-06-14 MED ORDER — SODIUM CHLORIDE 0.9 % IV SOLN
Freq: Once | INTRAVENOUS | Status: AC
  Filled 2020-06-14: qty 250

## 2020-06-14 MED ORDER — SODIUM CHLORIDE 0.9 % IV SOLN
4.0000 mg/kg | Freq: Once | INTRAVENOUS | Status: AC
  Administered 2020-06-14: 252 mg via INTRAVENOUS
  Filled 2020-06-14: qty 12

## 2020-06-14 NOTE — Progress Notes (Signed)
   Covid-19 Vaccination Clinic  Name:  [name]    MRN: [id number] DOB: 1965/06/11  06/14/2020  [name] was observed post Covid-19 immunization for 15 minutes without incident. She was provided with Vaccine Information Sheet and instruction to access the V-Safe system.   [name] was instructed to call 911 with any severe reactions post vaccine: Marland Kitchen Difficulty breathing  . Swelling of face and throat  . A fast heartbeat  . A bad rash all over body  . Dizziness and weakness   Immunizations Administered    Name Date Dose VIS Date Route   PFIZER Comrnaty(Gray TOP) Covid-19 Vaccine 06/14/2020  9:40 AM 0.3 mL 12/21/2019 Intramuscular   Manufacturer: [employer]   Lot: [id number]   [place]: [phone]

## 2020-06-14 NOTE — Patient Instructions (Signed)
[facility]  Discharge Instructions: Thank you for choosing [facility] to provide your oncology and hematology care.   If you have a lab appointment with the Cancer Center, please go directly to the Cancer Center and check in at the registration area.   Wear comfortable clothing and clothing appropriate for easy access to any Portacath or PICC line.   We strive to give you quality time with your provider. You may need to reschedule your appointment if you arrive late (15 or more minutes).  Arriving late affects you and other patients whose appointments are after yours.  Also, if you miss three or more appointments without notifying the office, you may be dismissed from the clinic at the provider's discretion.      For prescription refill requests, have your pharmacy contact our office and allow 72 hours for refills to be completed.    Today you received the following chemotherapy and/or immunotherapy agents trastuzumab      To help prevent nausea and vomiting after your treatment, we encourage you to take your nausea medication as directed.  BELOW ARE SYMPTOMS THAT SHOULD BE REPORTED IMMEDIATELY: *FEVER GREATER THAN 100.4 F (38 C) OR HIGHER *CHILLS OR SWEATING *NAUSEA AND VOMITING THAT IS NOT CONTROLLED WITH YOUR NAUSEA MEDICATION *UNUSUAL SHORTNESS OF BREATH *UNUSUAL BRUISING OR BLEEDING *URINARY PROBLEMS (pain or burning when urinating, or frequent urination) *BOWEL PROBLEMS (unusual diarrhea, constipation, pain near the anus) TENDERNESS IN MOUTH AND THROAT WITH OR WITHOUT PRESENCE OF ULCERS (sore throat, sores in mouth, or a toothache) UNUSUAL RASH, SWELLING OR PAIN  UNUSUAL VAGINAL DISCHARGE OR ITCHING   Items with * indicate a potential emergency and should be followed up as soon as possible or go to the Emergency Department if any problems should occur.  Please show the CHEMOTHERAPY ALERT CARD or IMMUNOTHERAPY ALERT CARD at check-in to  the Emergency Department and triage nurse.  Should you have questions after your visit or need to cancel or reschedule your appointment, please contact [facility]  Dept: [phone]  and follow the prompts.  Office hours are 8:00 a.m. to 4:30 p.m. Monday - Friday. Please note that voicemails left after 4:00 p.m. may not be returned until the following business day.  We are closed weekends and major holidays. You have access to a nurse at all times for urgent questions. Please call the main number to the clinic Dept: [phone] and follow the prompts.   For any non-urgent questions, you may also contact your provider using MyChart. We now offer e-Visits for anyone 18 and older to request care online for non-urgent symptoms. For details visit [email or web address].   Also download the MyChart app! Go to the app store, search "MyChart", open the app, select [facility], and log in with your MyChart username and password.  Due to Covid, a mask is required upon entering the hospital/clinic. If you do not have a mask, one will be given to you upon arrival. For doctor visits, patients may have 1 support person aged 18 or older with them. For treatment visits, patients cannot have anyone with them due to current Covid guidelines and our immunocompromised population.   

## 2020-06-15 LAB — CANCER ANTIGEN 27.29: CA 27.29: 79.3 U/mL — ABNORMAL HIGH (ref 0.0–38.6)

## 2020-06-21 ENCOUNTER — Other Ambulatory Visit (HOSPITAL_COMMUNITY): Payer: Self-pay

## 2020-06-24 ENCOUNTER — Other Ambulatory Visit: Payer: Self-pay | Admitting: Hematology

## 2020-06-24 ENCOUNTER — Other Ambulatory Visit (HOSPITAL_COMMUNITY): Payer: Self-pay

## 2020-06-24 DIAGNOSIS — C7951 Secondary malignant neoplasm of bone: Secondary | ICD-10-CM

## 2020-06-24 NOTE — Progress Notes (Signed)
[name]   [phone] [phone]   Clinic Follow up Note   Patient Care Team: [name], [name], [name] as PCP - General (Physician Assistant) [name], MD as Consulting Physician (Orthopedic Surgery)  Date of Service:  06/28/2020  CHIEF COMPLAINT: F/u of metastatic breast cancer  SUMMARY OF ONCOLOGIC HISTORY: Oncology History Overview Note   Cancer Staging No matching staging information was found for the patient.     [name], right ([place])  11/1997 Cancer Diagnosis   Patient had 1.4 cm poorly differentiated right breast carcinoma diagnosed in Nov 1999 at age 67, 1/7 axillary nodes involved, ER/PR and HER 2 positive. She had mastectomy with the 7 axillary node evaluation, 4 cycles of adriamycin/ cytoxan followed by taxotere, then five years of tamoxifen thru June 2005 (no herceptin in 1999). She had bilateral oophorectomy in May 2005. She was briefly on aromatase inhibitor after tamoxifen, but discontinued this herself due to poor tolerance.    04/04/2015 Genetic Testing   Negative genetic testing on the breast/ovarian cancer pnael.  The Breast/Ovarian gene panel offered by [facility] includes sequencing and rearrangement analysis for the following 20 genes:  ATM, BARD1, BRCA1, BRCA2, BRIP1, CDH1, CHEK2, EPCAM, FANCC, MLH1, MSH2, MSH6, NBN, PALB2, PMS2, PTEN, RAD51C, RAD51D, TP53, and XRCC2.   06/26/2015 Pathology Results   Initial Path Report Bone, curettage, Left femur met, intramedullary subtroch tissue - METASTATIC ADENOCARCINOMA.  Estrogen Receptor: 95%, POSITIVE, STRONG STAINING INTENSITY Progesterone Receptor: 2%, POSITIVE, STRONG  HER2 (+), IHC 3+    06/26/2015 Surgery   Biopsy of metastatic tissue and stabilization with Affixus trochanteric nail, proximal and distal interlock of left femur by Dr. [name]     06/28/2015 Imaging   CT Chest w/ Contrast 1. Extensive right internal mammary chain  lymphadenopathy consistent with metastatic breast cancer. 2. Lytic metastases involving the posterior elements at T1 with probable nondisplaced pathologic fracture of the spinous process. 3. No other evidence of thoracic metastatic disease. 4. Trace bilateral pleural effusions with associated bibasilar atelectasis. 5. Indeterminate right thyroid nodule, not previously imaged. This could be evaluated with thyroid ultrasound as clinically warranted.    06/29/2015 -  Chemotherapy   Zometa started monthly on 06/29/15 and switched to q51months from 01/08/17   07/01/2015 Imaging   Bone Scan 1. Increased activity noted throughout the left femur. Although metastatic disease cannot be excluded, these changes are most likely secondary to prior surgery. 2. No other focal abnormalities identified to suggest metastatic disease.    07/12/2015 - 07/26/2015 Radiation Therapy   Left femur, 30 Gy in 10 fractions by Dr. Sondra Come     07/14/2015 Initial Diagnosis   [name], right ([place])    07/19/2015 Imaging   Initial PET Scan 1. Severe multifocal osseous metastatic disease, much greater than anticipated based on prior imaging. 2. Multifocal nodal metastases involving the right internal mammary, prevascular and subpectoral lymph nodes. No axillary pulmonary involvement identified. 3. No distant extra osseous metastases.    07/30/2015 - 11/29/2015 Chemotherapy   Docetaxel, Herceptin and Perjeta every 3 weeks X6 cycles, pt tolerated moderately well, restaging scan showed excellent response     09/18/2015 Imaging   CT Head w/wo Contrast 1. No acute intracranial abnormality or significant interval change. 2. Stable minimal periventricular white matter hypoattenuation on the right. This may reflect a remote ischemic injury. 3. No evidence for metastatic disease to the brain. 4. No focal soft tissue lesion to explain  the patient's tenderness.    10/30/2015 Imaging   CT Abdomen Pelvis  w/ Contrast 1. Few mildly prominent fluid-filled loops of small bowel scattered throughout the abdomen, with intraluminal fluid density within the distal colon. Given the provided history, findings are suggestive of acute enteritis/diarrheal illness. 2. No other acute intra-abdominal or pelvic process identified. 3. Widespread osseous metastatic disease, better evaluated on most recent PET-CT from 07/19/2015. No pathologic fracture or other complication. 4. 11 mm hypodensity within the left kidney. This lesion measures intermediate density, and is indeterminate. While this lesion is similar in size relative to recent studies, this is increased in size relative to prior study from 2012. Further evaluation with dedicated renal mass protocol CT and/or MRI is recommended for complete characterization.    12/2015 - 05/14/2016 Chemotherapy   Maintenance Herceptin and pejeta every 3 weeks stopped on 05/14/16 due to disease progression    12/25/2015 Imaging   Restaging PET Scan 1. Markedly improved skeletal activity, with only several faint foci of residual accentuated metabolic activity, but resolution of the vast majority of the previously extensive osseous metastatic disease. 2. Resolution of the prior right internal mammary and right prevascular lymph nodes. 3. Residual subcutaneous edema and edema along fascia planes in the left upper thigh. This remains somewhat more than I would expect for placement of an IM nail 6 weeks ago. If there is leg swelling further down, consider Doppler venous ultrasound to rule out left lower extremity DVT. I do not perceive an obvious difference in density between the pelvic and common femoral veins on the noncontrast CT data. 4. Chronic right maxillary and right sphenoid sinusitis.    01/16/2016 - 10/20/2017 Anti-estrogen oral therapy   Letrozole 2.5 mg daily. She was switched to exemestane due to disease progression on 02/08/17. Stopped on 10/20/2017 when treatment changed  to chemo     03/01/2016 Miscellaneous   Patient presented to ED following a fall; she reports she landed on her back and is now experiencing back pain. The patient was evaluated and discharge home same day with pain control.    05/01/2016 Imaging   CT CAP IMPRESSION: 1. Stable exam.  No new or progressive disease identified. 2. No evidence for residual or recurrent adenopathy within the chest. 3. Stable bone metastasis.    05/01/2016 Imaging   BONE SCAN IMPRESSION: 1. Small focal uptake involving the anterior rib ends of the left second and right fourth ribs, new since the prior bone scan. The location of this uptake is more suggestive of a traumatic or inflammatory etiology as opposed to metastatic disease. No other evidence of metastatic disease. 2. There are other areas of stable uptake which are most likely degenerative/reactive, including right shoulder and cervical spine uptake and uptake along the right femur adjacent to the ORIF hardware.    05/20/2016 Imaging   NM PET Skull to Thigh IMPRESSION: 1. There multiple metastatic foci in the bony pelvis, including some new abnormal foci compare to the prior PET-CT, compatible with mildly progressive bony metastatic disease. Also, a left T11 vertebral hypermetabolic metastatic lesion is observed, new compared to the prior exam. 2. No active extraosseous malignancy is currently identified. 3. Right anterior fourth rib healing fracture, appears be    06/04/2016 - 01/08/2017 Chemotherapy   Second line chemotherapy Kadcyla every 3 weeks started on 06/04/16 and stopped on 01/08/17 due to mixed response.      10/05/2016 Imaging   CT CAP and Bone Scan:  Bones: left intramedullary rod and left  femoral head neck screw in place. In the proximal diaphysis of the left femur there is cortical thickening and irregularity. No lytic or blastic bone lesions. No fracture or vertebral endplate destruction.   No definite evidence of  recurrent disease in the chest, abdomen, or pelvis.    10/28/2016 Imaging   CT A/P IMPRESSION: No acute process demonstrated in the abdomen or pelvis. No evidence of bowel obstruction or inflammation.    11/04/2016 Imaging   DG foot complete left: IMPRESSION: No fracture or dislocation.  No soft tissue abnormality    11/23/2016 Imaging   CT RIGHT FEMUR IMPRESSION: Negative CT scan of the right thigh.  No visible metastatic disease.  CT LEFT FEMUR IMPRESSION: 1. Postsurgical changes related to prior cephalomedullary rod fixation of the left femur with linear lucency along the anterolateral proximal femoral cortex at level of the lesser trochanter, suspicious for nondisplaced fracture. 2. Slightly permeative appearance of the proximal left femoral cortex at the site of known prior osseous metastases is largely unchanged. 3. Unchanged patchy lucency and sclerosis along the anterior acetabulum, which appears to correspond with an area of faint uptake on the prior PET-CT, suspicious for osseous metastasis. These results will be called to the ordering clinician or representative by the Radiologist Assistant, and communication documented in the PACS or zVision Dashboard.        01/27/2017 Imaging   IMPRESSION: 1. Mixed response to osseous metastasis. Some hypermetabolic lesions are new and increasingly hypermetabolic, while another index lesion is decreasingly hypermetabolic. 2. No evidence of hypermetabolic soft tissue metastasis.      02/08/2017 - 10/19/2017 Antibody Plan   -Herceptin every 3 week since 02/08/17 -Oral Lanpantinib 1531m and decreased to 5053mdue to diarrhea and depression. Due to disease progression she swithced to Verzenio 1502mn 05/04/17. She did not toelrate well so she was switched to Ibrance 100m103m 05/31/17. Due to her neutropenia, Ibrance dose reduced to 75 mg daily, 3 weeks on, one-week off, stopped on 10/20/2017 due to disease progression.     04/30/2017 PET scan   IMPRESSION: 1. Primarily increase in hypermetabolism of osseous metastasis. An isolated right acetabular lesion is decreased in hypermetabolism since the prior exam. 2. No evidence of hypermetabolic soft tissue metastasis. 3. Similar non FDG avid right-sided thyroid nodule, favoring a benign etiology. Recommend attention on follow-up.    08/30/2017 PET scan   08/30/2017 PET Scan  IMPRESSION: 1. Worsening osseous metastatic disease. 2. Focal hypermetabolism in the central spinal canal the L1 level, similar to the prior exam. Metastatic implant cannot be excluded. 3. Slight enlargement of a right thyroid nodule which now appears more cystic in character. Consider further evaluation with thyroid ultrasound. If patient is clinically hyperthyroid, consider nuclear medicine thyroid uptake and scan    08/30/2017 Progression   08/30/2017 PET Scan  IMPRESSION: 1. Worsening osseous metastatic disease. 2. Focal hypermetabolism in the central spinal canal the L1 level, similar to the prior exam. Metastatic implant cannot be excluded. 3. Slight enlargement of a right thyroid nodule which now appears more cystic in character. Consider further evaluation with thyroid ultrasound. If patient is clinically hyperthyroid, consider nuclear medicine thyroid uptake and scan    09/29/2017 - 10/11/2017 Radiation Therapy   Pt received 27 Gy in 9 fractions directed at the areas of significant uptake noted on recent bone scan the right pelvis and right proximal femur, with Dr, KinaSondra Come 10/20/2017 - 04/07/2018 Chemotherapy   -Herceptin every 3 weeks starting 02/08/17, perjeta  added on 10/20/2017  -weekly Taxol started on 10/21/2017. Will reduce Taxol to 2 weeks on, 1 week off starting on 02/04/17.  Stoped due to mild disease progression    10/27/2017 Imaging   10/27/2017 CT AP IMPRESSION: 1. No acute abnormality. No evidence of bowel obstruction or acute bowel inflammation. Normal  appendix. 2. Stable patchy sclerotic osseous metastases throughout the visualized skeleton. No new or progressive metastatic disease in the abdomen or pelvis.    01/11/2018 Imaging   Whole Body Bone Scan 01/11/18  IMPRESSION: 1. Multifocal osseous metastases as described. 2. Right scapular metastasis. 3. Right superior thoracic spinous process. 4. Right sacral ala and right L5 metastases. 5. Right acetabular metastasis. 6. Right proximal femur metastasis. 7. Diffuse metastases throughout the left femur. 8. Anterior right fourth rib metastasis.    01/11/2018 Imaging   CT CAP W Contrast 01/11/18  IMPRESSION: 1. Similar-appearing osseous metastatic disease. 2. No evidence for progressed metastatic disease in the chest, abdomen or pelvis.    03/30/2018 Imaging   Bone Scan 03/30/18  IMPRESSION: Stable multifocal bony metastatic disease.    03/30/2018 Imaging    CT CAP W Contrast 03/30/18 IMPRESSION: 1. New mild contiguous wall thickening with associated mucosal hyperenhancement throughout the left colon and rectum, suggesting nonspecific infectious or inflammatory proctocolitis. Differential includes C diff colitis. 2. Stable faint patchy sclerosis in the iliac bones and lumbar vertebral bodies. No new or progressive osseous metastatic disease by CT. Please note that the osseous lesions are relatively occult by CT and would be better evaluated by PET-CT, as clinically warranted. 3. No evidence of extra osseous metastatic disease.   These results will be called to the ordering clinician or representative by the Radiologist Assistant, and communication documented in the PACS or zVision Dashboard.    04/15/2018 - 06/16/2018 Chemotherapy   Enhertu q3weeks starting 04/15/18. Stopped Enhertu after cycle 3 on 05/27/18 due to poor toleration due to poor toleration with anorexia, weight loss, N&V, diarrhea   04/21/2018 PET scan   PET 04/21/18  IMPRESSION: 1. Overall marked interval  improvement in bony hypermetabolic disease. All of the previously identified hypermetabolic osseous metastases have decreased in hypermetabolism in the interval. There is a new tiny focus of hypermetabolism in the right aspect of the L3 vertebral body that appears to correspond to a new 8 mm lucency, raising concern for new metastatic disease. Close attention on follow-up recommended. 2. Focal hypermetabolism in the central spinal canal at the level of L1 previously has resolved. 3. No hypermetabolic soft tissue disease on today's study. 4. Right thyroid nodule seen previously has decreased in the interval.      06/16/2018 Imaging   CT LUMBAR SPINE WO CONTRAST 06/16/18 IMPRESSION: 1. Transitional lumbosacral anatomy. Correlation with radiographs is recommended prior to any operative intervention. 2. Suspicion of a right L3 vertebral body metastasis on the April PET-CT is not evident by CT. Stable degenerative versus metastatic lesion at the right L5 superior endplate. Small but increased right iliac bone metastasis since March. Lumbar MRI would best characterize lumbar metastatic disease (without contrast if necessary). 3. Suspect L2-L3 left subarticular segment disc herniation. Query left L2 radiculitis. 4. Stable appearance of rightward L4-L5 disc degeneration which could affect the right foramen and right lateral recess.    06/17/2018 - 11/16/2018 Chemotherapy   Restart Taxol 2 weeks on/1 week off with Herceptin and Perjeta q3weeks on 06/17/18. Perjeta was stopped after C5 due to diarrhea. Stopped Taxol after 11/16/18 due to disease progression to L3.  07/06/2018 PET scan   PET  IMPRESSION: 1. Near total resolution of hypermetabolic activity associated with skeletal metastasis. Very mild activity associated with the LEFT iliac bone which is similar to background blood pool activity. 2. No new or progressive disease.   11/21/2018 PET scan   IMPRESSION: 1. New focal  hypermetabolic lesion eccentric to the right in the L3 vertebral body, maximum SUV 6.1 compatible with a new focus of active malignancy. 2. Very low-grade activity similar to blood pool in previous existing mildly sclerotic metastatic lesions including the left iliac bone lesion. Some of the mildly sclerotic lesions are not hypermetabolic compatible with effectively treated bony metastatic disease. 3. No hypermetabolic soft tissue lesions are identified.     11/30/2018 - 08/25/2019 Chemotherapy   Continue Herceptin q3weeks. Stopped 08/25/19 due to significant disease progression and switched to  IV Matuzumab   12/12/2018 - 12/17/2018 Chemotherapy   Tucatinib (TUKYSA) 6 tabs BID as a next line therapy starting 12/12/18. Stopped 12/17/18 duet o N&V, poor appetite and HA    12/21/2018 - 03/17/2019 Chemotherapy   IV Eribulin on day 1, 8 every 21 days starting 12/21/18. Stopped 03/17/19 due to mixed response to treatment.    03/21/2019 PET scan   IMPRESSION: 1. Mixed response to therapy of osseous metastasis. Although a right-sided L4 lesion demonstrates decreased hypermetabolism today, new left iliac and right ischial hypermetabolic lesions are seen. 2. No evidence of hypermetabolic soft tissue metastasis.     03/27/2019 - 07/21/2019 Chemotherapy   Neratinib (Nerlynx) titrate up from 113m to 2497mstarting 03/27/19. She could not tolerate 16034mnd stopped on 02/05/19 due to N&V and diarrhea. Will restart at 120m42m 04/10/18-04/27/19 and 4/19-4/22. Reduced to 80mg15me daily on 05/10/19. Held/stopped since 07/21/19 due to dry cough, Nausea and body aches.   06/23/2019 Imaging   CT Head  IMPRESSION: 1. No acute intracranial abnormality or metastatic disease identified. 2. Chronic white matter disease in the right hemisphere is stable from prior exams.     09/05/2019 PET scan   IMPRESSION: 1. Significant progression of widespread hypermetabolic osseous metastases throughout the axial skeleton as  detailed. 2. No hypermetabolic extraosseous sites of metastatic disease.   09/15/2019 - 12/22/2019 Chemotherapy   -IV Margetuximab q3weeks and IV Gemcitabine 2 weeks on/1 week off starting 09/15/19. Carboplatin added to Gemcitabine starting with C3 on 11/03/19. D/c after C5 due to disease progression in bone.    12/13/2019 Imaging   PET  IMPRESSION: 1. Interval progression of widespread, FDG avid osseous metastasis involving the axial skeleton. 2. No FDG avid extra osseous sites of metastatic disease.     01/19/2020 -  Chemotherapy   -Xeloda BID 2 weeks on/1 week off and Herceptin q3weeks starting 01/19/20. C1 at 1000mg 46m C2 increased to 1500mg i92me AM and 1000mg in24m PM starting 02/12/20.   04/09/2020 Imaging   CT CAP  IMPRESSION: 1. New 3 mm RIGHT upper lobe nodule, nonspecific, attention on follow-up. 2. What may represent an incomplete periprosthetic fracture about the cephalo medullary rod in the LEFT proximal femur. This linear band of incomplete bony fusion that is more pronounced than on previous CT imaging that was acquired in 2017 and may represent a stress type fracture about hardware. Correlate with any pain in this area. Sclerotic margins show chronic features but the linear band passing through the bone around the intramedullary rod is more extensive, involving approximately 270 degrees of the circumference of the femur, medial cortex remains intact. 3. Other findings  of bony metastatic disease are similar to prior imaging. Sclerosis and some periosteal reaction about a nondisplaced pathologic fracture of the LEFT lateral seventh rib. 4. Small hypervascular focus in the RIGHT hepatic lobe, indistinct but new compared to previous imaging. Perhaps this represents a transient phenomenon though is not imaged on the later phase to allow for confirmation. Liver MRI may be helpful for further evaluation, to exclude underlying metastatic lesion as clinically warranted. 5.  Hepatic steatosis. 6. Aortic atherosclerosis.   Aortic Atherosclerosis (ICD10-I70.0).        CURRENT THERAPY:  -Zometa q55month  -Xeloda BID 2 weeks on/1 week off and Herceptin q3weeks starting 01/19/20. C1 at 10077mBID. C2 increased to 150066mn the AM and 1000m67m the PM starting 02/12/20. Held since 03/22/20 due to hyperbilirubinemia. restart at 1000mg66m 7 days on and 7 days off on 04/19/2020   INTERVAL HISTORY:  WendyDANNIE WOOLENere for a follow up. She was last seen by me 06/14/20. She presents to the clinic with her interpretor. She notes she plans to leave [place]. She had her booster for COVID 2 weeks ago. She notes she started her current cycle Xeloda today. She has been taking 1000mg 63mfor 7 days on. She notes she was contacted by [facility] but did not want to go then to pick it up. She is interested in them mailing it to her. She notes her leg pain is stable and manageable. She notes when she went to pick up her Ambien, her cost was $37 but she does not know why. She notes occasionally she has headaches and she does have fatigue. She notes she is taking 2 potassium daily.     REVIEW OF SYSTEMS:   Constitutional: Denies fevers, chills or abnormal weight loss Eyes: Denies blurriness of vision Ears, nose, mouth, throat, and face: Denies mucositis or sore throat Respiratory: Denies cough, dyspnea or wheezes Cardiovascular: Denies palpitation, chest discomfort or lower extremity swelling Gastrointestinal:  Denies nausea, heartburn or change in bowel habits Skin: Denies abnormal skin rashes Lymphatics: Denies new lymphadenopathy or easy bruising Neurological:Denies numbness, tingling or new weaknesses Behavioral/Psych: Mood is stable, no new changes  All other systems were reviewed with the patient and are negative.  MEDICAL HISTORY:  Past Medical History:  Diagnosis Date   Abnormal Pap smear    Anxiety    Breast cancer ([facility])  [facility]   breast-s/p mastectomy, chemo, rad   CIN I (cervical intraepithelial neoplasia I) 2003   by colpo   Congenital deafness    Deaf    Depression    Port-A-Cath in place 2017   for chemotherapy   Radiation 07/12/15-07/26/15   left femur 30 Gy   S/P bilateral oophorectomy     SURGICAL HISTORY: Past Surgical History:  Procedure Laterality Date   ABDOMINAL HYSTERECTOMY     INTRAMEDULLARY (IM) NAIL INTERTROCHANTERIC Left 06/26/2015   Procedure: LEFT BIOMET LONG AFFIXS NAIL;  Surgeon: Mark CMarybelle Killings [facility];[place];  Laterality: Left;   IR GENERIC HISTORICAL  08/06/2015   IR US [place] VASC ACCESS LEFT 08/06/2015 [facility]   IR GENERIC HISTORICAL  08/06/2015   IR FLUORO GUIDE CV LINE LEFT 08/06/2015 Glenn Aletta [facility]L-INTERV RAD   IR GENERIC HISTORICAL  03/05/2016   IR CV LINE INJECTION 03/05/2016 [facility]Markus [facility]-INTERV RAD   LEFT HEART CATH AND CORONARY ANGIOGRAPHY  N/A 12/13/2017   Procedure: LEFT HEART CATH AND CORONARY ANGIOGRAPHY;  Surgeon: Leonie Man, MD;  Location: [facility];  Service: Cardiovascular;  Laterality: N/A;   MASTECTOMY     right breast   MASTECTOMY     OVARIAN CYST REMOVAL     RADIOLOGY WITH ANESTHESIA Left 06/25/2015   Procedure: MRI OF LEFT HIP WITH OR WITHOUT CONTRAST;  Surgeon: Medication Radiologist, MD;  Location: [facility];  Service: Radiology;  Laterality: Left;  DR. MCINTYRE/MRI   TUBAL LIGATION      I have reviewed the social history and family history with the patient and they are unchanged from previous note.  ALLERGIES:  is allergic to promethazine hcl, chlorhexidine, and diphenhydramine hcl.  MEDICATIONS:  Current Outpatient Medications  Medication Sig Dispense Refill   ALPRAZolam (XANAX) 0.25 MG tablet Take 1 tablet (0.25 mg total) by mouth 2 (two) times daily as needed for anxiety. 30 tablet 0   capecitabine (XELODA) 500 MG tablet TAKE 2 TABS IN MORNING, AND 2 TABS IN  EVENING. TAKE FOR 7 DAYS ON, 7 DAYS OFF. REPEAT EVERY 14 DAYS. START ON 04/19/2020 56 tablet 2   diphenoxylate-atropine (LOMOTIL) 2.5-0.025 MG tablet Take 1-2 tablets by mouth 4 (four) times daily as needed for diarrhea or loose stools. 60 tablet 1   lidocaine-prilocaine (EMLA) cream Apply 1 application topically as needed. Apply to Porta-Cath 1-2 hours prior to access as directed. 90 g 2   ondansetron (ZOFRAN) 8 MG tablet Take 1 tablet (8 mg total) by mouth every 8 (eight) hours as needed for nausea or vomiting. 45 tablet 1   oxyCODONE (ROXICODONE) 5 MG immediate release tablet Take 1 tablet (5 mg total) by mouth every 8 (eight) hours as needed for severe pain. Refill on 02/17/2020 90 tablet 0   potassium chloride (KLOR-CON) 10 MEQ tablet Take 1 tablet (10 mEq total) by mouth 3 (three) times daily. 90 tablet 2   prochlorperazine (COMPAZINE) 10 MG tablet Take 1 tablet (10 mg total) by mouth 2 (two) times daily as needed for nausea or vomiting. 20 tablet 1   zolpidem (AMBIEN) 10 MG tablet Take 1 tablet (10 mg total) by mouth at bedtime as needed for sleep. 30 tablet 0   No current facility-administered medications for this visit.   Facility-Administered Medications Ordered in Other Visits  Medication Dose Route Frequency Provider Last Rate Last Admin   potassium chloride SA (K-DUR) CR tablet 40 mEq  40 mEq Oral Once Truitt Merle, MD       sodium chloride 0.9 % 1,000 mL with potassium chloride 10 mEq infusion   Intravenous Continuous Livesay, Lennis P, MD   Stopped at 09/10/15 1653   sodium chloride 0.9 % injection 10 mL  10 mL Intravenous PRN Livesay, Lennis P, MD        PHYSICAL EXAMINATION: ECOG PERFORMANCE STATUS: 1 - Symptomatic but completely ambulatory  Vitals:   06/28/20 0852  BP: 125/69  Pulse: 70  Resp: 18  Temp: 98.1 F (36.7 C)  SpO2: 99%   Filed Weights   06/28/20 0852  Weight: 134 lb 12.8 oz (61.1 kg)    Due to COVID19 we will limit examination to appearance. Patient had no  complaints.  GENERAL:alert, no distress and comfortable SKIN: skin color normal, no rashes or significant lesions EYES: normal, Conjunctiva are pink and non-injected, sclera clear  NEURO: alert & oriented x 3 with fluent speech   LABORATORY DATA:  I have reviewed the data as listed  CBC Latest Ref Rng & Units 06/28/2020 06/14/2020 05/31/2020  WBC 4.0 - 10.5 K/uL 4.4 4.1 3.8(L)  Hemoglobin 12.0 - 15.0 g/dL 11.2(L) 10.9(L) 10.8(L)  Hematocrit 36.0 - 46.0 % 32.2(L) 31.1(L) 31.2(L)  Platelets 150 - 400 K/uL 182 152 157     CMP Latest Ref Rng & Units 06/14/2020 05/31/2020 05/17/2020  Glucose 70 - 99 mg/dL 89 90 113(H)  BUN 6 - 20 mg/dL _0 Creatinine 0.44 - 1.00 mg/dL 0.86 0.88 0.99  Sodium 135 - 145 mmol/L 141 140 143  Potassium 3.5 - 5.1 mmol/L 3.2(L) 3.7 3.1(L)  Chloride 98 - 111 mmol/L 105 106 107  CO2 22 - 32 mmol/L _1 Calcium 8.9 - 10.3 mg/dL 9.2 9.3 9.1  Total Protein 6.5 - 8.1 g/dL 6.5 6.4(L) 6.6  Total Bilirubin 0.3 - 1.2 mg/dL 2.0(H) 2.0(H) 2.0(H)  Alkaline Phos 38 - 126 U/L 77 68 69  AST 15 - 41 U/L _2 ALT 0 - 44 U/L _3 RADIOGRAPHIC STUDIES: I have personally reviewed the radiological images as listed and agreed with the findings in the report. No results found.   ASSESSMENT & PLAN:  [name] is a 55 y.o. female with    1. 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 She had right mastectomy, 4 cycles of AC-T and 5 years of Tamoxifen/AI. 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  -After her first line of chemo treatment and initial maintenance therapy, she progressed or had poor toleration of multiple lines of treatment since then.  -Based on 12/13/19 PET, she progressed diffusely in the bone on 7th line therapy with anti-HER2 IV Margetuximab q3weeks with IV Chemo weekly Gemcitabine (Carboplatin added C3) 2 weeks  on/1 week off after 5 cycles, 09/15/19-12/22/19.  -I previously discussed the longer she is on treatment the less treatment options and efficacy she will have. -I started her on Xeloda 1070m BID 2 weeks on/1 week off for first cycle on 01/19/20. Continue Herceptin q3weeks. Increased to 15061min the AM and 10006mn the PM since C2. Given worsened hyperbilirubinemia, Xeloda was held 04/01/20-04/19/20 then reduced to 1000m10mD 1 week on/1 week off. She is tolerating well and liver function stable.  -Labs reviewed, Hg 11.2. Overall adequate to proceed with Herceptin today. I discussed the option of changing the treatment to every 3 weeks. She opted to continue Herceptin treatment every 2 weeks.  -Start current Cycle Xeloda today (06/28/20). Will continue 1000mg3m, 1 week on/1 week off. Will have her next prescription mailed.  -F/u in 3 weeks with next treatment given travels. Will start next cycle Xeloda on 7/8      2. Body pain, secondary to bone mets, Hypocalcemia, leg cramps   -She is being treated with Zometa q3mont52monthe will continue oral calcium and Vit D and Prilosec for Acid Reflux.  -Current pain includes, lower back, upper legs, left hip and right shoulder. PET 12/15/19 does indicate disease progression in her spine, pelvis and right scapula. She also has headaches occasionally.  -Her 03/20/20 CT CAP shows possible fracture around pin of left hip from prior surgery.  She was evaluated by orthopedic surgeon Dr. Yates,Lorin Mercyrecommended conservative measurements -Her pain has improved lately and well controlled She has not required much oxycodone lately.    3. Congenital  deafness   4. Mild anemia, secondary to chemotherapy and malignancy  -Moderate and Stable   5. Hypokalemia -She developed diarrhea s/p cycle 3 Perjeta and again with Nerlynx, Both have been D/c. -She is on Oral potassium 44mq 2 tabs daily. I encouraged her to increase potassium in diet.    6. Goal of care discussion,  DNR/DNI -We previously reviewed goal of care and I recommend DNR/DNI, she previously agreed. -I encouraged her to start setting up her living will. We previously discussed hospice care if she has no more treatment available or could not tolerate more treatment   7. Insomnia -She cannot take Benadryl due to allergy reaction. She has tried trazodone with no relief. -She is on Xanax and on Ambien 170mat half tablets nightly. She knows not to take both at the same time. If she needs Oxycodone at night, she can use half tablet only. -Stable and well controlled.    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 Continue monitoring. Stable.    9. Mild hyperbilirubinemia -likely second to chemotherapy, fluctuates  -Worsened on 03/22/20 with total bilirubin 3.2. Increased again to 3.7 on 04/01/20. -There is subtle change in liver on her 04/09/20 CT CAP, but she declined MRI Liver for further evaluation. -Stable Liver function since I restarted her on low dose Xeloda     PLAN:   -I refilled Xeloda today, will have [facility] it to her.  -Labs reviewed, will proceed with Herceptin today. Will continue every 2 weeks.  -Current cycle Xeloda at 1000 mg twice daily, 7 days on and 7 days off, starting today.  -Lab, flush, follow-up and Herceptin on 07/19/20 due to her travel. She will start next cycle Xeloda then.  -Lab, flush, follow-up and Herceptin on 7/22 and 8/5  -Proceed with PET san on 7/7  -will schedule echo on 7/7   No problem-specific Assessment & Plan notes found for this encounter.   Orders Placed This Encounter  Procedures   ECHOCARDIOGRAM COMPLETE    Standing Status:   Future    Standing Expiration Date:   06/28/2021    Order Specific Question:   Where should this test be performed    Answer:   [facility]  Order Specific Question:   Perflutren DEFINITY (image enhancing agent) should be administered unless hypersensitivity or allergy exist    Answer:   Administer Perflutren    Order  Specific Question:   Is a special reader required? (athlete or structural heart)    Answer:   No    Order Specific Question:   Does this study need to be read by the Structural team/Level 3 readers?    Answer:   No    Order Specific Question:   Reason for exam-Echo    Answer:   Chemo  Z09    All questions were answered. The patient knows to call the clinic with any problems, questions or concerns. No barriers to learning was detected. The total time spent in the appointment was 30 minutes.     YaTruitt MerleMD 06/28/2020   I, AmJoslyn Devonam acting as scribe for YaTruitt MerleMD.   I have reviewed the above documentation for accuracy and completeness, and I agree with the above.

## 2020-06-25 ENCOUNTER — Other Ambulatory Visit: Payer: Self-pay | Admitting: Hematology

## 2020-06-25 ENCOUNTER — Other Ambulatory Visit (HOSPITAL_COMMUNITY): Payer: Self-pay

## 2020-06-25 DIAGNOSIS — C50911 Malignant neoplasm of unspecified site of right female breast: Secondary | ICD-10-CM

## 2020-06-26 ENCOUNTER — Other Ambulatory Visit: Payer: Self-pay | Admitting: Hematology

## 2020-06-26 DIAGNOSIS — C50911 Malignant neoplasm of unspecified site of right female breast: Secondary | ICD-10-CM

## 2020-06-26 MED ORDER — CAPECITABINE 500 MG PO TABS
ORAL_TABLET | ORAL | 2 refills | Status: DC
  Filled 2020-06-26: qty 56, 28d supply, fill #0
  Filled 2020-07-10: qty 56, 14d supply, fill #0
  Filled 2020-07-29: qty 56, 14d supply, fill #1

## 2020-06-27 ENCOUNTER — Other Ambulatory Visit (HOSPITAL_COMMUNITY): Payer: Self-pay

## 2020-06-28 ENCOUNTER — Inpatient Hospital Stay (HOSPITAL_BASED_OUTPATIENT_CLINIC_OR_DEPARTMENT_OTHER): Payer: Medicaid Other | Admitting: Hematology

## 2020-06-28 ENCOUNTER — Other Ambulatory Visit: Payer: Self-pay

## 2020-06-28 ENCOUNTER — Inpatient Hospital Stay: Payer: Medicaid Other

## 2020-06-28 ENCOUNTER — Telehealth: Payer: Self-pay

## 2020-06-28 ENCOUNTER — Other Ambulatory Visit (HOSPITAL_COMMUNITY): Payer: Self-pay

## 2020-06-28 VITALS — BP 125/69 | HR 70 | Temp 98.1°F | Resp 18 | Ht 65.0 in | Wt 134.8 lb

## 2020-06-28 DIAGNOSIS — C50911 Malignant neoplasm of unspecified site of right female breast: Secondary | ICD-10-CM

## 2020-06-28 DIAGNOSIS — C7951 Secondary malignant neoplasm of bone: Secondary | ICD-10-CM

## 2020-06-28 DIAGNOSIS — Z5112 Encounter for antineoplastic immunotherapy: Secondary | ICD-10-CM | POA: Diagnosis not present

## 2020-06-28 DIAGNOSIS — Z95828 Presence of other vascular implants and grafts: Secondary | ICD-10-CM

## 2020-06-28 LAB — CBC WITH DIFFERENTIAL (CANCER CENTER ONLY)
Abs Immature Granulocytes: 0.01 10*3/uL (ref 0.00–0.07)
Basophils Absolute: 0 10*3/uL (ref 0.0–0.1)
Basophils Relative: 1 %
Eosinophils Absolute: 0.1 10*3/uL (ref 0.0–0.5)
Eosinophils Relative: 3 %
HCT: 32.2 % — ABNORMAL LOW (ref 36.0–46.0)
Hemoglobin: 11.2 g/dL — ABNORMAL LOW (ref 12.0–15.0)
Immature Granulocytes: 0 %
Lymphocytes Relative: 27 %
Lymphs Abs: 1.2 10*3/uL (ref 0.7–4.0)
MCH: 33.3 pg (ref 26.0–34.0)
MCHC: 34.8 g/dL (ref 30.0–36.0)
MCV: 95.8 fL (ref 80.0–100.0)
Monocytes Absolute: 0.4 10*3/uL (ref 0.1–1.0)
Monocytes Relative: 9 %
Neutro Abs: 2.7 10*3/uL (ref 1.7–7.7)
Neutrophils Relative %: 60 %
Platelet Count: 182 10*3/uL (ref 150–400)
RBC: 3.36 MIL/uL — ABNORMAL LOW (ref 3.87–5.11)
RDW: 14.9 % (ref 11.5–15.5)
WBC Count: 4.4 10*3/uL (ref 4.0–10.5)
nRBC: 0 % (ref 0.0–0.2)

## 2020-06-28 LAB — CMP (CANCER CENTER ONLY)
ALT: 15 U/L (ref 0–44)
AST: 21 U/L (ref 15–41)
Albumin: 4.1 g/dL (ref 3.5–5.0)
Alkaline Phosphatase: 70 U/L (ref 38–126)
Anion gap: 9 (ref 5–15)
BUN: 12 mg/dL (ref 6–20)
CO2: 27 mmol/L (ref 22–32)
Calcium: 9.4 mg/dL (ref 8.9–10.3)
Chloride: 103 mmol/L (ref 98–111)
Creatinine: 1.11 mg/dL — ABNORMAL HIGH (ref 0.44–1.00)
GFR, Estimated: 59 mL/min — ABNORMAL LOW (ref >60)
Glucose, Bld: 95 mg/dL (ref 70–99)
Potassium: 3.4 mmol/L — ABNORMAL LOW (ref 3.5–5.1)
Sodium: 139 mmol/L (ref 135–145)
Total Bilirubin: 2.4 mg/dL — ABNORMAL HIGH (ref 0.3–1.2)
Total Protein: 6.9 g/dL (ref 6.5–8.1)

## 2020-06-28 MED ORDER — HEPARIN SOD (PORK) LOCK FLUSH 100 UNIT/ML IV SOLN
500.0000 [IU] | Freq: Once | INTRAVENOUS | Status: AC | PRN
  Administered 2020-06-28: 500 [IU]
  Filled 2020-06-28: qty 5

## 2020-06-28 MED ORDER — LORATADINE 10 MG PO TABS
ORAL_TABLET | ORAL | Status: AC
  Filled 2020-06-28: qty 1

## 2020-06-28 MED ORDER — SODIUM CHLORIDE 0.9% FLUSH
10.0000 mL | INTRAVENOUS | Status: DC | PRN
  Administered 2020-06-28: 10 mL
  Filled 2020-06-28: qty 10

## 2020-06-28 MED ORDER — SODIUM CHLORIDE 0.9 % IV SOLN
Freq: Once | INTRAVENOUS | Status: AC
  Filled 2020-06-28: qty 250

## 2020-06-28 MED ORDER — ACETAMINOPHEN 325 MG PO TABS
650.0000 mg | ORAL_TABLET | Freq: Once | ORAL | Status: AC
  Administered 2020-06-28: 650 mg via ORAL

## 2020-06-28 MED ORDER — SODIUM CHLORIDE 0.9% FLUSH
10.0000 mL | Freq: Once | INTRAVENOUS | Status: AC
Start: 2020-06-28 — End: 2020-06-28
  Administered 2020-06-28: 10 mL via INTRAVENOUS
  Filled 2020-06-28: qty 10

## 2020-06-28 MED ORDER — SODIUM CHLORIDE 0.9 % IJ SOLN: 10.0000 mL | INTRAMUSCULAR | Status: DC | PRN

## 2020-06-28 MED ORDER — TRASTUZUMAB-DKST CHEMO 150 MG IV SOLR
6.0000 mg/kg | Freq: Once | INTRAVENOUS | Status: AC
  Administered 2020-06-28: 357 mg via INTRAVENOUS
  Filled 2020-06-28: qty 17

## 2020-06-28 MED ORDER — ACETAMINOPHEN 325 MG PO TABS
ORAL_TABLET | ORAL | Status: AC
  Filled 2020-06-28: qty 2

## 2020-06-28 MED ORDER — LORATADINE 10 MG PO TABS
10.0000 mg | ORAL_TABLET | Freq: Once | ORAL | Status: AC
  Administered 2020-06-28: 10 mg via ORAL

## 2020-06-28 NOTE — Telephone Encounter (Signed)
I spoke with Einar Pheasant at [facility].  I requested that her capecitabine be mailed prior to 07/19/2020.  He stated he will mail out the week before.

## 2020-06-28 NOTE — Patient Instructions (Signed)
[facility]  Discharge Instructions: Thank you for choosing [facility] to provide your oncology and hematology care.   If you have a lab appointment with the Cancer Center, please go directly to the Cancer Center and check in at the registration area.   Wear comfortable clothing and clothing appropriate for easy access to any Portacath or PICC line.   We strive to give you quality time with your provider. You may need to reschedule your appointment if you arrive late (15 or more minutes).  Arriving late affects you and other patients whose appointments are after yours.  Also, if you miss three or more appointments without notifying the office, you may be dismissed from the clinic at the provider's discretion.      For prescription refill requests, have your pharmacy contact our office and allow 72 hours for refills to be completed.    Today you received the following chemotherapy and/or immunotherapy agents trastuzumab      To help prevent nausea and vomiting after your treatment, we encourage you to take your nausea medication as directed.  BELOW ARE SYMPTOMS THAT SHOULD BE REPORTED IMMEDIATELY: *FEVER GREATER THAN 100.4 F (38 C) OR HIGHER *CHILLS OR SWEATING *NAUSEA AND VOMITING THAT IS NOT CONTROLLED WITH YOUR NAUSEA MEDICATION *UNUSUAL SHORTNESS OF BREATH *UNUSUAL BRUISING OR BLEEDING *URINARY PROBLEMS (pain or burning when urinating, or frequent urination) *BOWEL PROBLEMS (unusual diarrhea, constipation, pain near the anus) TENDERNESS IN MOUTH AND THROAT WITH OR WITHOUT PRESENCE OF ULCERS (sore throat, sores in mouth, or a toothache) UNUSUAL RASH, SWELLING OR PAIN  UNUSUAL VAGINAL DISCHARGE OR ITCHING   Items with * indicate a potential emergency and should be followed up as soon as possible or go to the Emergency Department if any problems should occur.  Please show the CHEMOTHERAPY ALERT CARD or IMMUNOTHERAPY ALERT CARD at check-in to  the Emergency Department and triage nurse.  Should you have questions after your visit or need to cancel or reschedule your appointment, please contact [facility]  Dept: [phone]  and follow the prompts.  Office hours are 8:00 a.m. to 4:30 p.m. Monday - Friday. Please note that voicemails left after 4:00 p.m. may not be returned until the following business day.  We are closed weekends and major holidays. You have access to a nurse at all times for urgent questions. Please call the main number to the clinic Dept: [phone] and follow the prompts.   For any non-urgent questions, you may also contact your provider using MyChart. We now offer e-Visits for anyone 18 and older to request care online for non-urgent symptoms. For details visit [email or web address].   Also download the MyChart app! Go to the app store, search "MyChart", open the app, select [facility], and log in with your MyChart username and password.  Due to Covid, a mask is required upon entering the hospital/clinic. If you do not have a mask, one will be given to you upon arrival. For doctor visits, patients may have 1 support person aged 18 or older with them. For treatment visits, patients cannot have anyone with them due to current Covid guidelines and our immunocompromised population.   

## 2020-06-28 NOTE — Progress Notes (Signed)
Per Dr. Burr Medico, ok to proceed with echo from November.

## 2020-06-29 LAB — CANCER ANTIGEN 27.29: CA 27.29: 83.6 U/mL — ABNORMAL HIGH (ref 0.0–38.6)

## 2020-07-03 ENCOUNTER — Other Ambulatory Visit: Payer: Self-pay | Admitting: Hematology

## 2020-07-03 DIAGNOSIS — C50911 Malignant neoplasm of unspecified site of right female breast: Secondary | ICD-10-CM

## 2020-07-09 ENCOUNTER — Other Ambulatory Visit (HOSPITAL_COMMUNITY): Payer: Self-pay

## 2020-07-10 ENCOUNTER — Other Ambulatory Visit (HOSPITAL_COMMUNITY): Payer: Self-pay

## 2020-07-16 ENCOUNTER — Other Ambulatory Visit (HOSPITAL_COMMUNITY): Payer: Self-pay

## 2020-07-18 ENCOUNTER — Ambulatory Visit (HOSPITAL_BASED_OUTPATIENT_CLINIC_OR_DEPARTMENT_OTHER)
Admission: RE | Admit: 2020-07-18 | Discharge: 2020-07-18 | Disposition: A | Discharge status: Home / Self Care | Payer: Medicaid Other | Source: Ambulatory Visit | Attending: Hematology | Admitting: Hematology

## 2020-07-18 ENCOUNTER — Other Ambulatory Visit: Payer: Self-pay

## 2020-07-18 ENCOUNTER — Ambulatory Visit (HOSPITAL_COMMUNITY)
Admission: RE | Admit: 2020-07-18 | Discharge: 2020-07-18 | Disposition: A | Discharge status: Home / Self Care | Payer: Medicaid Other | Source: Ambulatory Visit | Attending: Hematology | Admitting: Hematology

## 2020-07-18 DIAGNOSIS — Z0189 Encounter for other specified special examinations: Secondary | ICD-10-CM | POA: Diagnosis not present

## 2020-07-18 DIAGNOSIS — C50911 Malignant neoplasm of unspecified site of right female breast: Secondary | ICD-10-CM

## 2020-07-18 DIAGNOSIS — C7951 Secondary malignant neoplasm of bone: Secondary | ICD-10-CM | POA: Insufficient documentation

## 2020-07-18 DIAGNOSIS — C50919 Malignant neoplasm of unspecified site of unspecified female breast: Secondary | ICD-10-CM | POA: Diagnosis not present

## 2020-07-18 LAB — GLUCOSE, CAPILLARY: Glucose-Capillary: 104 mg/dL — ABNORMAL HIGH (ref 70–99)

## 2020-07-18 LAB — ECHOCARDIOGRAM COMPLETE
Area-P 1/2: 4.8 cm2
Calc EF: 50.7 %
S' Lateral: 3.5 cm
Single Plane A2C EF: 52.8 %
Single Plane A4C EF: 50 %

## 2020-07-18 MED ORDER — FLUDEOXYGLUCOSE F - 18 (FDG) INJECTION
6.5000 | Freq: Once | INTRAVENOUS | Status: AC | PRN
  Administered 2020-07-18: 6.7 via INTRAVENOUS

## 2020-07-18 NOTE — Progress Notes (Signed)
  Echocardiogram 2D Echocardiogram has been performed.  [name] 07/18/2020, 11:22 AM

## 2020-07-19 ENCOUNTER — Inpatient Hospital Stay (HOSPITAL_BASED_OUTPATIENT_CLINIC_OR_DEPARTMENT_OTHER): Payer: Medicaid Other | Admitting: Hematology

## 2020-07-19 ENCOUNTER — Encounter: Payer: Self-pay | Admitting: Hematology

## 2020-07-19 ENCOUNTER — Other Ambulatory Visit (HOSPITAL_COMMUNITY): Payer: Medicaid Other

## 2020-07-19 ENCOUNTER — Inpatient Hospital Stay: Payer: Medicaid Other | Attending: Hematology

## 2020-07-19 ENCOUNTER — Inpatient Hospital Stay: Payer: Medicaid Other

## 2020-07-19 ENCOUNTER — Other Ambulatory Visit: Payer: Self-pay

## 2020-07-19 ENCOUNTER — Other Ambulatory Visit (HOSPITAL_COMMUNITY): Payer: Self-pay

## 2020-07-19 VITALS — BP 131/68 | HR 73 | Temp 97.9°F | Resp 18 | Ht 65.0 in | Wt 133.8 lb

## 2020-07-19 DIAGNOSIS — Z95828 Presence of other vascular implants and grafts: Secondary | ICD-10-CM

## 2020-07-19 DIAGNOSIS — Z170 Estrogen receptor positive status [ER+]: Secondary | ICD-10-CM | POA: Insufficient documentation

## 2020-07-19 DIAGNOSIS — C50911 Malignant neoplasm of unspecified site of right female breast: Secondary | ICD-10-CM | POA: Diagnosis not present

## 2020-07-19 DIAGNOSIS — Z5111 Encounter for antineoplastic chemotherapy: Secondary | ICD-10-CM | POA: Diagnosis present

## 2020-07-19 DIAGNOSIS — C7951 Secondary malignant neoplasm of bone: Secondary | ICD-10-CM | POA: Insufficient documentation

## 2020-07-19 DIAGNOSIS — Z66 Do not resuscitate: Secondary | ICD-10-CM

## 2020-07-19 DIAGNOSIS — G620 Drug-induced polyneuropathy: Secondary | ICD-10-CM | POA: Diagnosis not present

## 2020-07-19 DIAGNOSIS — Z5112 Encounter for antineoplastic immunotherapy: Secondary | ICD-10-CM | POA: Insufficient documentation

## 2020-07-19 DIAGNOSIS — E876 Hypokalemia: Secondary | ICD-10-CM

## 2020-07-19 DIAGNOSIS — D6481 Anemia due to antineoplastic chemotherapy: Secondary | ICD-10-CM | POA: Insufficient documentation

## 2020-07-19 DIAGNOSIS — H905 Unspecified sensorineural hearing loss: Secondary | ICD-10-CM | POA: Diagnosis not present

## 2020-07-19 LAB — CBC WITH DIFFERENTIAL (CANCER CENTER ONLY)
Abs Immature Granulocytes: 0.01 10*3/uL (ref 0.00–0.07)
Basophils Absolute: 0 10*3/uL (ref 0.0–0.1)
Basophils Relative: 1 %
Eosinophils Absolute: 0.1 10*3/uL (ref 0.0–0.5)
Eosinophils Relative: 3 %
HCT: 31.9 % — ABNORMAL LOW (ref 36.0–46.0)
Hemoglobin: 10.9 g/dL — ABNORMAL LOW (ref 12.0–15.0)
Immature Granulocytes: 0 %
Lymphocytes Relative: 31 %
Lymphs Abs: 1.2 10*3/uL (ref 0.7–4.0)
MCH: 32.8 pg (ref 26.0–34.0)
MCHC: 34.2 g/dL (ref 30.0–36.0)
MCV: 96.1 fL (ref 80.0–100.0)
Monocytes Absolute: 0.4 10*3/uL (ref 0.1–1.0)
Monocytes Relative: 10 %
Neutro Abs: 2.2 10*3/uL (ref 1.7–7.7)
Neutrophils Relative %: 55 %
Platelet Count: 181 10*3/uL (ref 150–400)
RBC: 3.32 MIL/uL — ABNORMAL LOW (ref 3.87–5.11)
RDW: 13.8 % (ref 11.5–15.5)
WBC Count: 3.9 10*3/uL — ABNORMAL LOW (ref 4.0–10.5)
nRBC: 0 % (ref 0.0–0.2)

## 2020-07-19 LAB — CMP (CANCER CENTER ONLY)
ALT: 13 U/L (ref 0–44)
AST: 15 U/L (ref 15–41)
Albumin: 3.5 g/dL (ref 3.5–5.0)
Alkaline Phosphatase: 84 U/L (ref 38–126)
Anion gap: 14 (ref 5–15)
BUN: 9 mg/dL (ref 6–20)
CO2: 24 mmol/L (ref 22–32)
Calcium: 9.4 mg/dL (ref 8.9–10.3)
Chloride: 104 mmol/L (ref 98–111)
Creatinine: 1.03 mg/dL — ABNORMAL HIGH (ref 0.44–1.00)
GFR, Estimated: >60 mL/min (ref >60)
Glucose, Bld: 157 mg/dL — ABNORMAL HIGH (ref 70–99)
Potassium: 2.8 mmol/L — ABNORMAL LOW (ref 3.5–5.1)
Sodium: 142 mmol/L (ref 135–145)
Total Bilirubin: 2.6 mg/dL — ABNORMAL HIGH (ref 0.3–1.2)
Total Protein: 6.9 g/dL (ref 6.5–8.1)

## 2020-07-19 MED ORDER — POTASSIUM CHLORIDE 10 MEQ/100ML IV SOLN
INTRAVENOUS | Status: AC
  Filled 2020-07-19: qty 100

## 2020-07-19 MED ORDER — TRASTUZUMAB-DKST CHEMO 150 MG IV SOLR
4.0000 mg/kg | Freq: Once | INTRAVENOUS | Status: AC
  Administered 2020-07-19: 252 mg via INTRAVENOUS
  Filled 2020-07-19: qty 12

## 2020-07-19 MED ORDER — ACETAMINOPHEN 325 MG PO TABS
ORAL_TABLET | ORAL | Status: AC
  Filled 2020-07-19: qty 2

## 2020-07-19 MED ORDER — ACETAMINOPHEN 325 MG PO TABS
650.0000 mg | ORAL_TABLET | Freq: Once | ORAL | Status: AC
  Administered 2020-07-19: 650 mg via ORAL

## 2020-07-19 MED ORDER — POTASSIUM CHLORIDE 10 MEQ/100ML IV SOLN: 10.0000 meq | Freq: Once | INTRAVENOUS | Status: DC

## 2020-07-19 MED ORDER — HEPARIN SOD (PORK) LOCK FLUSH 100 UNIT/ML IV SOLN
500.0000 [IU] | Freq: Once | INTRAVENOUS | Status: AC | PRN
  Administered 2020-07-19: 500 [IU]
  Filled 2020-07-19: qty 5

## 2020-07-19 MED ORDER — SODIUM CHLORIDE 0.9 % IV SOLN
Freq: Once | INTRAVENOUS | Status: AC
  Filled 2020-07-19: qty 250

## 2020-07-19 MED ORDER — POTASSIUM CHLORIDE 10 MEQ/100ML IV SOLN
10.0000 meq | Freq: Once | INTRAVENOUS | Status: AC
Start: 2020-07-19 — End: 2020-07-19
  Administered 2020-07-19: 10 meq via INTRAVENOUS

## 2020-07-19 MED ORDER — LORATADINE 10 MG PO TABS
10.0000 mg | ORAL_TABLET | Freq: Once | ORAL | Status: AC
  Administered 2020-07-19: 10 mg via ORAL

## 2020-07-19 MED ORDER — DIPHENHYDRAMINE HCL 25 MG PO CAPS
ORAL_CAPSULE | ORAL | Status: AC
  Filled 2020-07-19: qty 2

## 2020-07-19 MED ORDER — LORATADINE 10 MG PO TABS
ORAL_TABLET | ORAL | Status: AC
  Filled 2020-07-19: qty 1

## 2020-07-19 MED ORDER — OXYCODONE HCL 5 MG PO TABS: 5.0000 mg | ORAL_TABLET | Freq: Three times a day (TID) | ORAL | 0 refills | Status: DC | PRN

## 2020-07-19 MED ORDER — SODIUM CHLORIDE 0.9% FLUSH
10.0000 mL | INTRAVENOUS | Status: DC | PRN
  Administered 2020-07-19: 10 mL via INTRAVENOUS
  Filled 2020-07-19: qty 10

## 2020-07-19 MED ORDER — SODIUM CHLORIDE 0.9% FLUSH
10.0000 mL | INTRAVENOUS | Status: DC | PRN
  Administered 2020-07-19: 10 mL
  Filled 2020-07-19: qty 10

## 2020-07-19 MED ORDER — SODIUM CHLORIDE 0.9 % IJ SOLN
10.0000 mL | INTRAMUSCULAR | Status: DC | PRN
  Filled 2020-07-19: qty 10

## 2020-07-19 NOTE — Patient Instructions (Addendum)
[facility]  Discharge Instructions: Thank you for choosing [facility] to provide your oncology and hematology care.   If you have a lab appointment with the [facility], please go directly to the [facility] and check in at the registration area.   Wear comfortable clothing and clothing appropriate for easy access to any Portacath or PICC line.   We strive to give you quality time with your provider. You may need to reschedule your appointment if you arrive late (15 or more minutes).  Arriving late affects you and other patients whose appointments are after yours.  Also, if you miss three or more appointments without notifying the office, you may be dismissed from the clinic at the provider's discretion.      For prescription refill requests, have your pharmacy contact our office and allow 72 hours for refills to be completed.    Today you received the following chemotherapy and/or immunotherapy agent: Trastuzumab (Ogivri)   To help prevent nausea and vomiting after your treatment, we encourage you to take your nausea medication as directed.  BELOW ARE SYMPTOMS THAT SHOULD BE REPORTED IMMEDIATELY: *FEVER GREATER THAN 100.4 F (38 C) OR HIGHER *CHILLS OR SWEATING *NAUSEA AND VOMITING THAT IS NOT CONTROLLED WITH YOUR NAUSEA MEDICATION *UNUSUAL SHORTNESS OF BREATH *UNUSUAL BRUISING OR BLEEDING *URINARY PROBLEMS (pain or burning when urinating, or frequent urination) *BOWEL PROBLEMS (unusual diarrhea, constipation, pain near the anus) TENDERNESS IN MOUTH AND THROAT WITH OR WITHOUT PRESENCE OF ULCERS (sore throat, sores in mouth, or a toothache) UNUSUAL RASH, SWELLING OR PAIN  UNUSUAL VAGINAL DISCHARGE OR ITCHING   Items with * indicate a potential emergency and should be followed up as soon as possible or go to the Emergency Department if any problems should occur.  Please show the CHEMOTHERAPY ALERT CARD or IMMUNOTHERAPY ALERT CARD at  check-in to the Emergency Department and triage nurse.  Should you have questions after your visit or need to cancel or reschedule your appointment, please contact [facility]  Dept: [phone]  and follow the prompts.  Office hours are 8:00 a.m. to 4:30 p.m. Monday - Friday. Please note that voicemails left after 4:00 p.m. may not be returned until the following business day.  We are closed weekends and major holidays. You have access to a nurse at all times for urgent questions. Please call the main number to the clinic Dept: [phone] and follow the prompts.   For any non-urgent questions, you may also contact your provider using MyChart. We now offer e-Visits for anyone 11 and older to request care online for non-urgent symptoms. For details visit [email or web address].   Also download the MyChart app! Go to the app store, search "MyChart", open the app, select [facility], and log in with your MyChart username and password.  Due to Covid, a mask is required upon entering the hospital/clinic. If you do not have a mask, one will be given to you upon arrival. For doctor visits, patients may have 1 support person aged 76 or older with them. For treatment visits, patients cannot have anyone with them due to current Covid guidelines and our immunocompromised population.   Hypokalemia Hypokalemia means that the amount of potassium in the blood is lower than normal. Potassium is a chemical (electrolyte) that helps regulate the amount of fluid in the body. It also stimulates muscle tightening (contraction) and helps nerves work properly. Normally, most of the body's potassium is inside cells, and  only a very small amount is in the blood. Because the amount in the blood is so small, minorchanges to potassium levels in the blood can be life-threatening. What are the causes? This condition may be caused by: Antibiotic medicine. Diarrhea or vomiting. Taking too much of a  medicine that helps you have a bowel movement (laxative) can cause diarrhea and lead to hypokalemia. Chronic kidney disease (CKD). Medicines that help the body get rid of excess fluid (diuretics). Eating disorders, such as bulimia. Low magnesium levels in the body. Sweating a lot. What are the signs or symptoms? Symptoms of this condition include: Weakness. Constipation. Fatigue. Muscle cramps. Mental confusion. Skipped heartbeats or irregular heartbeat (palpitations). Tingling or numbness. How is this diagnosed? This condition is diagnosed with a blood test. How is this treated? This condition may be treated by: Taking potassium supplements by mouth. Adjusting the medicines that you take. Eating more foods that contain a lot of potassium. If your potassium level is very low, you may need to get potassium through anIV and be monitored in the hospital. Follow these instructions at home:  Take over-the-counter and prescription medicines only as told by your health care provider. This includes vitamins and supplements. Eat a healthy diet. A healthy diet includes fresh fruits and vegetables, whole grains, healthy fats, and lean proteins. If instructed, eat more foods that contain a lot of potassium. This includes: Nuts, such as peanuts and pistachios. Seeds, such as sunflower seeds and pumpkin seeds. Peas, lentils, and lima beans. Whole grain and bran cereals and breads. Fresh fruits and vegetables, such as apricots, avocado, bananas, cantaloupe, kiwi, oranges, tomatoes, asparagus, and potatoes. Orange juice. Tomato juice. Red meats. Yogurt. Keep all follow-up visits as told by your health care provider. This is important. Contact a health care provider if you: Have weakness that gets worse. Feel your heart pounding or racing. Vomit. Have diarrhea. Have diabetes (diabetes mellitus) and you have trouble keeping your blood sugar (glucose) in your target range. Get help right  away if you: Have chest pain. Have shortness of breath. Have vomiting or diarrhea that lasts for more than 2 days. Faint. Summary Hypokalemia means that the amount of potassium in the blood is lower than normal. This condition is diagnosed with a blood test. Hypokalemia may be treated by taking potassium supplements, adjusting the medicines that you take, or eating more foods that are high in potassium. If your potassium level is very low, you may need to get potassium through an IV and be monitored in the hospital. This information is not intended to replace advice given to you by your health care provider. Make sure you discuss any questions you have with your healthcare provider. Document Revised: 08/11/2017 Document Reviewed: 08/11/2017 Elsevier Patient Education  2022 [place].  Potassium Chloride Extended-Release Capsules What is this medication? POTASSIUM CHLORIDE (poe TASS i um KLOOR ide) is a potassium supplement used to prevent and to treat low potassium. Potassium is important for the heart, muscles, and nerves. Too much or too little potassium in the body can causeserious problems. This medicine may be used for other purposes; ask your health care provider orpharmacist if you have questions. COMMON BRAND NAME(S): Klor-Con, Micro-K, Micro-K Extencaps What should I tell my care team before I take this medication? They need to know if you have any of these conditions: Addison disease dehydration diabetes, high blood sugar difficulty swallowing heart disease high levels of potassium in the blood irregular heartbeat or rhythm kidney disease large areas  of burned skin stomach ulcers, other stomach or intestine problems an unusual or allergic reaction to potassium, other medicines, foods, dyes, or preservatives pregnant or trying to get pregnant breast-feeding How should I use this medication? Take this drug by mouth with a glass of water. Take it as directed on the  prescription label at the same time every day. Take it with food. Do not cut, crush, chew, or suck this drug. Swallow the capsules whole. You may open the capsule and put the contents in a teaspoon of soft food, such as applesauce or pudding. Do not add to hot foods. Swallow the mixture right away. Do not chew the mixture. Drink a glass of water or juice after taking the mixture. Keeptaking this medicine unless your health care provider tells you to stop. Talk to your health care provider about the use of this drug in children.Special care may be needed. Overdosage: If you think you have taken too much of this medicine contact apoison control center or emergency room at once. NOTE: This medicine is only for you. Do not share this medicine with others. What if I miss a dose? If you miss a dose, take it as soon as you can. If it is almost time for yournext dose, take only that dose. Do not take double or extra doses. What may interact with this medication? Do not take this medicine with any of the following medications: certain diuretics such as spironolactone, triamterene certain medicines for stomach problems like atropine; difenoxin and glycopyrrolate eplerenone sodium polystyrene sulfonate This medicine may also interact with the following medications: certain medicines for blood pressure or heart disease like lisinopril, losartan, quinapril, valsartan medicines that lower your chance of fighting infection such as cyclosporine, tacrolimus NSAIDs, medicines for pain and inflammation, like ibuprofen or naproxen other potassium supplements salt substitutes This list may not describe all possible interactions. Give your health care provider a list of all the medicines, herbs, non-prescription drugs, or dietary supplements you use. Also tell them if you smoke, drink alcohol, or use illegaldrugs. Some items may interact with your medicine. What should I watch for while using this medication? Visit  your doctor or health care professional for regular check ups. You willneed lab work done regularly. You may need to be on a special diet while taking this medicine. Ask yourdoctor. What side effects may I notice from receiving this medication? Side effects that you should report to your doctor or health care professionalas soon as possible: allergic reactions like skin rash, itching or hives, swelling of the face, lips, or tongue black, tarry stools breathing problems confusion heartburn fast, irregular heartbeat feeling faint or lightheaded, falls low blood pressure numbness or tingling in hands or feet pain when swallowing unusually weak or tired weakness, heaviness of legs Side effects that usually do not require medical attention (report to yourdoctor or health care professional if they continue or are bothersome): diarrhea nausea, vomiting stomach pain This list may not describe all possible side effects. Call your doctor for medical advice about side effects. You may report side effects to FDA at1-800-FDA-1088. Where should I keep my medication? Keep out of the reach of children. Store at room temperature between 15 and 30 degrees C (59 and 86 degrees F ). Keep bottle closed tightly to protect this medicine from light and moisture.Throw away any unused medicine after the expiration date. NOTE: This sheet is a summary. It may not cover all possible information. If you have questions about this medicine, talk  to your doctor, pharmacist, orhealth care provider.  2022 Elsevier/Gold Standard (2018-10-25 16:43:28)

## 2020-07-19 NOTE — Progress Notes (Signed)
[name]   [phone] Fax:(336) [phone]   Clinic Follow up Note   Patient Care Team: [name], [name], [name] as PCP - General (Physician Assistant) [name], MD as Consulting Physician (Orthopedic Surgery)  Date of Service:  07/19/2020  CHIEF COMPLAINT: f/u of metastatic breast cncer  SUMMARY OF ONCOLOGIC HISTORY: Oncology History Overview Note   Cancer Staging No matching staging information was found for the patient.     [name], right ([place])  11/1997 Cancer Diagnosis   Patient had 1.4 cm poorly differentiated right breast carcinoma diagnosed in Nov 1999 at age 55, 1/7 axillary nodes involved, ER/PR and HER 2 positive. She had mastectomy with the 7 axillary node evaluation, 4 cycles of adriamycin/ cytoxan followed by taxotere, then five years of tamoxifen thru June 2005 (no herceptin in 1999). She had bilateral oophorectomy in May 2005. She was briefly on aromatase inhibitor after tamoxifen, but discontinued this herself due to poor tolerance.    04/04/2015 Genetic Testing   Negative genetic testing on the breast/ovarian cancer pnael.  The Breast/Ovarian gene panel offered by [facility] includes sequencing and rearrangement analysis for the following 20 genes:  ATM, BARD1, BRCA1, BRCA2, BRIP1, CDH1, CHEK2, EPCAM, FANCC, MLH1, MSH2, MSH6, NBN, PALB2, PMS2, PTEN, RAD51C, RAD51D, TP53, and XRCC2.   06/26/2015 Pathology Results   Initial Path Report Bone, curettage, Left femur met, intramedullary subtroch tissue - METASTATIC ADENOCARCINOMA.  Estrogen Receptor: 95%, POSITIVE, STRONG STAINING INTENSITY Progesterone Receptor: 2%, POSITIVE, STRONG  HER2 (+), IHC 3+    06/26/2015 Surgery   Biopsy of metastatic tissue and stabilization with Affixus trochanteric nail, proximal and distal interlock of left femur by Dr. [name]     06/28/2015 Imaging   CT Chest w/ Contrast 1. Extensive right internal mammary chain  lymphadenopathy consistent with metastatic breast cancer. 2. Lytic metastases involving the posterior elements at T1 with probable nondisplaced pathologic fracture of the spinous process. 3. No other evidence of thoracic metastatic disease. 4. Trace bilateral pleural effusions with associated bibasilar atelectasis. 5. Indeterminate right thyroid nodule, not previously imaged. This could be evaluated with thyroid ultrasound as clinically warranted.    06/29/2015 -  Chemotherapy   Zometa started monthly on 06/29/15 and switched to q88months from 01/08/17   07/01/2015 Imaging   Bone Scan 1. Increased activity noted throughout the left femur. Although metastatic disease cannot be excluded, these changes are most likely secondary to prior surgery. 2. No other focal abnormalities identified to suggest metastatic disease.    07/12/2015 - 07/26/2015 Radiation Therapy   Left femur, 30 Gy in 10 fractions by Dr. Sondra Come     07/14/2015 Initial Diagnosis   [name], right ([place])    07/19/2015 Imaging   Initial PET Scan 1. Severe multifocal osseous metastatic disease, much greater than anticipated based on prior imaging. 2. Multifocal nodal metastases involving the right internal mammary, prevascular and subpectoral lymph nodes. No axillary pulmonary involvement identified. 3. No distant extra osseous metastases.    07/30/2015 - 11/29/2015 Chemotherapy   Docetaxel, Herceptin and Perjeta every 3 weeks X6 cycles, pt tolerated moderately well, restaging scan showed excellent response     09/18/2015 Imaging   CT Head w/wo Contrast 1. No acute intracranial abnormality or significant interval change. 2. Stable minimal periventricular white matter hypoattenuation on the right. This may reflect a remote ischemic injury. 3. No evidence for metastatic disease to the brain. 4. No focal soft tissue lesion to explain  the patient's tenderness.    10/30/2015 Imaging   CT Abdomen Pelvis  w/ Contrast 1. Few mildly prominent fluid-filled loops of small bowel scattered throughout the abdomen, with intraluminal fluid density within the distal colon. Given the provided history, findings are suggestive of acute enteritis/diarrheal illness. 2. No other acute intra-abdominal or pelvic process identified. 3. Widespread osseous metastatic disease, better evaluated on most recent PET-CT from 07/19/2015. No pathologic fracture or other complication. 4. 11 mm hypodensity within the left kidney. This lesion measures intermediate density, and is indeterminate. While this lesion is similar in size relative to recent studies, this is increased in size relative to prior study from 2012. Further evaluation with dedicated renal mass protocol CT and/or MRI is recommended for complete characterization.    12/2015 - 05/14/2016 Chemotherapy   Maintenance Herceptin and pejeta every 3 weeks stopped on 05/14/16 due to disease progression    12/25/2015 Imaging   Restaging PET Scan 1. Markedly improved skeletal activity, with only several faint foci of residual accentuated metabolic activity, but resolution of the vast majority of the previously extensive osseous metastatic disease. 2. Resolution of the prior right internal mammary and right prevascular lymph nodes. 3. Residual subcutaneous edema and edema along fascia planes in the left upper thigh. This remains somewhat more than I would expect for placement of an IM nail 6 weeks ago. If there is leg swelling further down, consider Doppler venous ultrasound to rule out left lower extremity DVT. I do not perceive an obvious difference in density between the pelvic and common femoral veins on the noncontrast CT data. 4. Chronic right maxillary and right sphenoid sinusitis.    01/16/2016 - 10/20/2017 Anti-estrogen oral therapy   Letrozole 2.5 mg daily. She was switched to exemestane due to disease progression on 02/08/17. Stopped on 10/20/2017 when treatment changed  to chemo     03/01/2016 Miscellaneous   Patient presented to ED following a fall; she reports she landed on her back and is now experiencing back pain. The patient was evaluated and discharge home same day with pain control.    05/01/2016 Imaging   CT CAP IMPRESSION: 1. Stable exam.  No new or progressive disease identified. 2. No evidence for residual or recurrent adenopathy within the chest. 3. Stable bone metastasis.    05/01/2016 Imaging   BONE SCAN IMPRESSION: 1. Small focal uptake involving the anterior rib ends of the left second and right fourth ribs, new since the prior bone scan. The location of this uptake is more suggestive of a traumatic or inflammatory etiology as opposed to metastatic disease. No other evidence of metastatic disease. 2. There are other areas of stable uptake which are most likely degenerative/reactive, including right shoulder and cervical spine uptake and uptake along the right femur adjacent to the ORIF hardware.    05/20/2016 Imaging   NM PET Skull to Thigh IMPRESSION: 1. There multiple metastatic foci in the bony pelvis, including some new abnormal foci compare to the prior PET-CT, compatible with mildly progressive bony metastatic disease. Also, a left T11 vertebral hypermetabolic metastatic lesion is observed, new compared to the prior exam. 2. No active extraosseous malignancy is currently identified. 3. Right anterior fourth rib healing fracture, appears be    06/04/2016 - 01/08/2017 Chemotherapy   Second line chemotherapy Kadcyla every 3 weeks started on 06/04/16 and stopped on 01/08/17 due to mixed response.      10/05/2016 Imaging   CT CAP and Bone Scan:  Bones: left intramedullary rod and left  femoral head neck screw in place. In the proximal diaphysis of the left femur there is cortical thickening and irregularity. No lytic or blastic bone lesions. No fracture or vertebral endplate destruction.   No definite evidence of  recurrent disease in the chest, abdomen, or pelvis.    10/28/2016 Imaging   CT A/P IMPRESSION: No acute process demonstrated in the abdomen or pelvis. No evidence of bowel obstruction or inflammation.    11/04/2016 Imaging   DG foot complete left: IMPRESSION: No fracture or dislocation.  No soft tissue abnormality    11/23/2016 Imaging   CT RIGHT FEMUR IMPRESSION: Negative CT scan of the right thigh.  No visible metastatic disease.  CT LEFT FEMUR IMPRESSION: 1. Postsurgical changes related to prior cephalomedullary rod fixation of the left femur with linear lucency along the anterolateral proximal femoral cortex at level of the lesser trochanter, suspicious for nondisplaced fracture. 2. Slightly permeative appearance of the proximal left femoral cortex at the site of known prior osseous metastases is largely unchanged. 3. Unchanged patchy lucency and sclerosis along the anterior acetabulum, which appears to correspond with an area of faint uptake on the prior PET-CT, suspicious for osseous metastasis. These results will be called to the ordering clinician or representative by the Radiologist Assistant, and communication documented in the PACS or zVision Dashboard.        01/27/2017 Imaging   IMPRESSION: 1. Mixed response to osseous metastasis. Some hypermetabolic lesions are new and increasingly hypermetabolic, while another index lesion is decreasingly hypermetabolic. 2. No evidence of hypermetabolic soft tissue metastasis.      02/08/2017 - 10/19/2017 Antibody Plan   -Herceptin every 3 week since 02/08/17 -Oral Lanpantinib 1531m and decreased to 5053mdue to diarrhea and depression. Due to disease progression she swithced to Verzenio 1502mn 05/04/17. She did not toelrate well so she was switched to Ibrance 100m103m 05/31/17. Due to her neutropenia, Ibrance dose reduced to 75 mg daily, 3 weeks on, one-week off, stopped on 10/20/2017 due to disease progression.     04/30/2017 PET scan   IMPRESSION: 1. Primarily increase in hypermetabolism of osseous metastasis. An isolated right acetabular lesion is decreased in hypermetabolism since the prior exam. 2. No evidence of hypermetabolic soft tissue metastasis. 3. Similar non FDG avid right-sided thyroid nodule, favoring a benign etiology. Recommend attention on follow-up.    08/30/2017 PET scan   08/30/2017 PET Scan  IMPRESSION: 1. Worsening osseous metastatic disease. 2. Focal hypermetabolism in the central spinal canal the L1 level, similar to the prior exam. Metastatic implant cannot be excluded. 3. Slight enlargement of a right thyroid nodule which now appears more cystic in character. Consider further evaluation with thyroid ultrasound. If patient is clinically hyperthyroid, consider nuclear medicine thyroid uptake and scan    08/30/2017 Progression   08/30/2017 PET Scan  IMPRESSION: 1. Worsening osseous metastatic disease. 2. Focal hypermetabolism in the central spinal canal the L1 level, similar to the prior exam. Metastatic implant cannot be excluded. 3. Slight enlargement of a right thyroid nodule which now appears more cystic in character. Consider further evaluation with thyroid ultrasound. If patient is clinically hyperthyroid, consider nuclear medicine thyroid uptake and scan    09/29/2017 - 10/11/2017 Radiation Therapy   Pt received 27 Gy in 9 fractions directed at the areas of significant uptake noted on recent bone scan the right pelvis and right proximal femur, with Dr, KinaSondra Come 10/20/2017 - 04/07/2018 Chemotherapy   -Herceptin every 3 weeks starting 02/08/17, perjeta  added on 10/20/2017  -weekly Taxol started on 10/21/2017. Will reduce Taxol to 2 weeks on, 1 week off starting on 02/04/17.  Stoped due to mild disease progression    10/27/2017 Imaging   10/27/2017 CT AP IMPRESSION: 1. No acute abnormality. No evidence of bowel obstruction or acute bowel inflammation. Normal  appendix. 2. Stable patchy sclerotic osseous metastases throughout the visualized skeleton. No new or progressive metastatic disease in the abdomen or pelvis.    01/11/2018 Imaging   Whole Body Bone Scan 01/11/18  IMPRESSION: 1. Multifocal osseous metastases as described. 2. Right scapular metastasis. 3. Right superior thoracic spinous process. 4. Right sacral ala and right L5 metastases. 5. Right acetabular metastasis. 6. Right proximal femur metastasis. 7. Diffuse metastases throughout the left femur. 8. Anterior right fourth rib metastasis.    01/11/2018 Imaging   CT CAP W Contrast 01/11/18  IMPRESSION: 1. Similar-appearing osseous metastatic disease. 2. No evidence for progressed metastatic disease in the chest, abdomen or pelvis.    03/30/2018 Imaging   Bone Scan 03/30/18  IMPRESSION: Stable multifocal bony metastatic disease.    03/30/2018 Imaging    CT CAP W Contrast 03/30/18 IMPRESSION: 1. New mild contiguous wall thickening with associated mucosal hyperenhancement throughout the left colon and rectum, suggesting nonspecific infectious or inflammatory proctocolitis. Differential includes C diff colitis. 2. Stable faint patchy sclerosis in the iliac bones and lumbar vertebral bodies. No new or progressive osseous metastatic disease by CT. Please note that the osseous lesions are relatively occult by CT and would be better evaluated by PET-CT, as clinically warranted. 3. No evidence of extra osseous metastatic disease.   These results will be called to the ordering clinician or representative by the Radiologist Assistant, and communication documented in the PACS or zVision Dashboard.    04/15/2018 - 06/16/2018 Chemotherapy   Enhertu q3weeks starting 04/15/18. Stopped Enhertu after cycle 3 on 05/27/18 due to poor toleration due to poor toleration with anorexia, weight loss, N&V, diarrhea   04/21/2018 PET scan   PET 04/21/18  IMPRESSION: 1. Overall marked interval  improvement in bony hypermetabolic disease. All of the previously identified hypermetabolic osseous metastases have decreased in hypermetabolism in the interval. There is a new tiny focus of hypermetabolism in the right aspect of the L3 vertebral body that appears to correspond to a new 8 mm lucency, raising concern for new metastatic disease. Close attention on follow-up recommended. 2. Focal hypermetabolism in the central spinal canal at the level of L1 previously has resolved. 3. No hypermetabolic soft tissue disease on today's study. 4. Right thyroid nodule seen previously has decreased in the interval.      06/16/2018 Imaging   CT LUMBAR SPINE WO CONTRAST 06/16/18 IMPRESSION: 1. Transitional lumbosacral anatomy. Correlation with radiographs is recommended prior to any operative intervention. 2. Suspicion of a right L3 vertebral body metastasis on the April PET-CT is not evident by CT. Stable degenerative versus metastatic lesion at the right L5 superior endplate. Small but increased right iliac bone metastasis since March. Lumbar MRI would best characterize lumbar metastatic disease (without contrast if necessary). 3. Suspect L2-L3 left subarticular segment disc herniation. Query left L2 radiculitis. 4. Stable appearance of rightward L4-L5 disc degeneration which could affect the right foramen and right lateral recess.    06/17/2018 - 11/16/2018 Chemotherapy   Restart Taxol 2 weeks on/1 week off with Herceptin and Perjeta q3weeks on 06/17/18. Perjeta was stopped after C5 due to diarrhea. Stopped Taxol after 11/16/18 due to disease progression to L3.  07/06/2018 PET scan   PET  IMPRESSION: 1. Near total resolution of hypermetabolic activity associated with skeletal metastasis. Very mild activity associated with the LEFT iliac bone which is similar to background blood pool activity. 2. No new or progressive disease.   11/21/2018 PET scan   IMPRESSION: 1. New focal  hypermetabolic lesion eccentric to the right in the L3 vertebral body, maximum SUV 6.1 compatible with a new focus of active malignancy. 2. Very low-grade activity similar to blood pool in previous existing mildly sclerotic metastatic lesions including the left iliac bone lesion. Some of the mildly sclerotic lesions are not hypermetabolic compatible with effectively treated bony metastatic disease. 3. No hypermetabolic soft tissue lesions are identified.     11/30/2018 - 08/25/2019 Chemotherapy   Continue Herceptin q3weeks. Stopped 08/25/19 due to significant disease progression and switched to  IV Matuzumab   12/12/2018 - 12/17/2018 Chemotherapy   Tucatinib (TUKYSA) 6 tabs BID as a next line therapy starting 12/12/18. Stopped 12/17/18 duet o N&V, poor appetite and HA    12/21/2018 - 03/17/2019 Chemotherapy   IV Eribulin on day 1, 8 every 21 days starting 12/21/18. Stopped 03/17/19 due to mixed response to treatment.    03/21/2019 PET scan   IMPRESSION: 1. Mixed response to therapy of osseous metastasis. Although a right-sided L4 lesion demonstrates decreased hypermetabolism today, new left iliac and right ischial hypermetabolic lesions are seen. 2. No evidence of hypermetabolic soft tissue metastasis.     03/27/2019 - 07/21/2019 Chemotherapy   Neratinib (Nerlynx) titrate up from 113m to 2497mstarting 03/27/19. She could not tolerate 16034mnd stopped on 02/05/19 due to N&V and diarrhea. Will restart at 120m42m 04/10/18-04/27/19 and 4/19-4/22. Reduced to 80mg15me daily on 05/10/19. Held/stopped since 07/21/19 due to dry cough, Nausea and body aches.   06/23/2019 Imaging   CT Head  IMPRESSION: 1. No acute intracranial abnormality or metastatic disease identified. 2. Chronic white matter disease in the right hemisphere is stable from prior exams.     09/05/2019 PET scan   IMPRESSION: 1. Significant progression of widespread hypermetabolic osseous metastases throughout the axial skeleton as  detailed. 2. No hypermetabolic extraosseous sites of metastatic disease.   09/15/2019 - 12/22/2019 Chemotherapy   -IV Margetuximab q3weeks and IV Gemcitabine 2 weeks on/1 week off starting 09/15/19. Carboplatin added to Gemcitabine starting with C3 on 11/03/19. D/c after C5 due to disease progression in bone.    12/13/2019 Imaging   PET  IMPRESSION: 1. Interval progression of widespread, FDG avid osseous metastasis involving the axial skeleton. 2. No FDG avid extra osseous sites of metastatic disease.     01/19/2020 -  Chemotherapy   -Xeloda BID 2 weeks on/1 week off and Herceptin q3weeks starting 01/19/20. C1 at 1000mg 46m C2 increased to 1500mg i92me AM and 1000mg in24m PM starting 02/12/20.   04/09/2020 Imaging   CT CAP  IMPRESSION: 1. New 3 mm RIGHT upper lobe nodule, nonspecific, attention on follow-up. 2. What may represent an incomplete periprosthetic fracture about the cephalo medullary rod in the LEFT proximal femur. This linear band of incomplete bony fusion that is more pronounced than on previous CT imaging that was acquired in 2017 and may represent a stress type fracture about hardware. Correlate with any pain in this area. Sclerotic margins show chronic features but the linear band passing through the bone around the intramedullary rod is more extensive, involving approximately 270 degrees of the circumference of the femur, medial cortex remains intact. 3. Other findings  of bony metastatic disease are similar to prior imaging. Sclerosis and some periosteal reaction about a nondisplaced pathologic fracture of the LEFT lateral seventh rib. 4. Small hypervascular focus in the RIGHT hepatic lobe, indistinct but new compared to previous imaging. Perhaps this represents a transient phenomenon though is not imaged on the later phase to allow for confirmation. Liver MRI may be helpful for further evaluation, to exclude underlying metastatic lesion as clinically warranted. 5.  Hepatic steatosis. 6. Aortic atherosclerosis.   Aortic Atherosclerosis (ICD10-I70.0).     07/18/2020 Imaging   PET  IMPRESSION: No signs of visceral or nodal metastatic disease.   Increased number of hypermetabolic lesions with increased intensity of many of these areas and increased "size" of affected areas based on the regions of FDG uptake. While there are areas that do show diminished FDG uptake such as in the RIGHT scapula on balance the number and size of these areas has increased.   08/02/2020 -  Chemotherapy    Patient is on Treatment Plan: BREAST ADJUVANT CMF IV Q21D   Patient is on Antibody Plan: BREAST TRASTUZUMAB Q21D        CURRENT THERAPY:  -Zometa q54months  -Xeloda BID 2 weeks on/1 week off and Herceptin q3weeks starting 01/19/20. C1 at $Rem'1000mg'BkIv$  BID. C2 increased to $RemoveBefo'1500mg'SgxIVbyUiNd$  in the AM and $Remo'1000mg'AlhgH$  in the PM starting 02/12/20. Held since 03/22/20 due to hyperbilirubinemia. restart at $RemoveBe'1000mg'pkzNzOlBf$  bid 7 days on and 7 days off on 04/19/2020  INTERVAL HISTORY:  [name] is here for a follow up of metastatic breast cancer. She was last seen by me on 06/28/20. She presents to the clinic accompanied by an interpreter. She reports aching pain all over and notes she is sleeping more. She also notes feeling sick recently, with nausea and a cough. Today, she feels "blah." She reports yesterday she vomited, but she has been trying to drink a lot. She took her temperature, and it was only 99. She notes she enjoyed her recent trip. However, she had to come back early because of her mother, who lives in [place]. She has a cut on her shin from where her granddaughter (age 77) accidentally ran into her.  All other systems were reviewed with the patient and are negative.  MEDICAL HISTORY:  Past Medical History:  Diagnosis Date   Abnormal Pap smear    Anxiety    Breast cancer ([place])    Cancer ([place]) 1999   breast-s/p mastectomy, chemo, rad   CIN I (cervical intraepithelial neoplasia I) 2003    by colpo   Congenital deafness    Deaf    Depression    Port-A-Cath in place 2017   for chemotherapy   Radiation 07/12/15-07/26/15   left femur 30 Gy   S/P bilateral oophorectomy     SURGICAL HISTORY: Past Surgical History:  Procedure Laterality Date   ABDOMINAL HYSTERECTOMY     INTRAMEDULLARY (IM) NAIL INTERTROCHANTERIC Left 06/26/2015   Procedure: LEFT BIOMET LONG AFFIXS NAIL;  Surgeon: [name], MD;  Location: [facility];  Service: Orthopedics;  Laterality: Left;   IR GENERIC HISTORICAL  08/06/2015   IR US GUIDE VASC ACCESS LEFT 08/06/2015 Aletta Edouard, MD [facility]   IR GENERIC HISTORICAL  08/06/2015   IR FLUORO GUIDE CV LINE LEFT 08/06/2015 Aletta Edouard, MD [facility]   IR GENERIC HISTORICAL  03/05/2016   IR CV LINE INJECTION 03/05/2016 Markus Daft, MD [facility]   LEFT HEART CATH AND CORONARY ANGIOGRAPHY N/A 12/13/2017  Procedure: LEFT HEART CATH AND CORONARY ANGIOGRAPHY;  Surgeon: Leonie Man, MD;  Location: [facility];  Service: Cardiovascular;  Laterality: N/A;   MASTECTOMY     right breast   MASTECTOMY     OVARIAN CYST REMOVAL     RADIOLOGY WITH ANESTHESIA Left 06/25/2015   Procedure: MRI OF LEFT HIP WITH OR WITHOUT CONTRAST;  Surgeon: Medication Radiologist, MD;  Location: [facility];  Service: Radiology;  Laterality: Left;  DR. MCINTYRE/MRI   TUBAL LIGATION      I have reviewed the social history and family history with the patient and they are unchanged from previous note.  ALLERGIES:  is allergic to promethazine hcl, chlorhexidine, and diphenhydramine hcl.  MEDICATIONS:  Current Outpatient Medications  Medication Sig Dispense Refill   ALPRAZolam (XANAX) 0.25 MG tablet Take 1 tablet (0.25 mg total) by mouth 2 (two) times daily as needed for anxiety. 30 tablet 0   capecitabine (XELODA) 500 MG tablet TAKE 2 TABS IN MORNING, AND 2 TABS IN EVENING. TAKE FOR 7 DAYS ON, 7 DAYS OFF. REPEAT EVERY 14 DAYS. START ON 04/19/2020 56 tablet 2    diphenoxylate-atropine (LOMOTIL) 2.5-0.025 MG tablet Take 1-2 tablets by mouth 4 (four) times daily as needed for diarrhea or loose stools. 60 tablet 1   lidocaine-prilocaine (EMLA) cream Apply 1 application topically as needed. Apply to Porta-Cath 1-2 hours prior to access as directed. 90 g 2   ondansetron (ZOFRAN) 8 MG tablet Take 1 tablet (8 mg total) by mouth every 8 (eight) hours as needed for nausea or vomiting. 45 tablet 1   oxyCODONE (ROXICODONE) 5 MG immediate release tablet Take 1 tablet (5 mg total) by mouth every 8 (eight) hours as needed for severe pain. Refill on 02/17/2020 90 tablet 0   potassium chloride (KLOR-CON) 10 MEQ tablet Take 1 tablet (10 mEq total) by mouth 3 (three) times daily. 90 tablet 2   prochlorperazine (COMPAZINE) 10 MG tablet Take 1 tablet (10 mg total) by mouth 2 (two) times daily as needed for nausea or vomiting. 20 tablet 1   zolpidem (AMBIEN) 10 MG tablet Take 1 tablet (10 mg total) by mouth at bedtime as needed for sleep. 30 tablet 0   No current facility-administered medications for this visit.   Facility-Administered Medications Ordered in Other Visits  Medication Dose Route Frequency Provider Last Rate Last Admin   potassium chloride SA (K-DUR) CR tablet 40 mEq  40 mEq Oral Once Truitt Merle, MD       sodium chloride 0.9 % 1,000 mL with potassium chloride 10 mEq infusion   Intravenous Continuous Livesay, Lennis P, MD   Stopped at 09/10/15 1653   sodium chloride 0.9 % injection 10 mL  10 mL Intravenous PRN Livesay, Lennis P, MD       sodium chloride flush (NS) 0.9 % injection 10 mL  10 mL Intracatheter PRN Truitt Merle, MD   10 mL at 07/19/20 1212    PHYSICAL EXAMINATION: ECOG PERFORMANCE STATUS: 1 - Symptomatic but completely ambulatory  Vitals:   07/19/20 0812  BP: 131/68  Pulse: 73  Resp: 18  Temp: 97.9 F (36.6 C)  SpO2: 100%   Filed Weights   07/19/20 0812  Weight: 133 lb 12.8 oz (60.7 kg)    Due to COVID19 we will limit examination to  appearance. Patient had no complaints.  GENERAL:alert, no distress and comfortable SKIN: skin color normal, no rashes or significant lesions EYES: normal, Conjunctiva are pink and non-injected, sclera clear  NEURO: alert & oriented x 3 with fluent speech  LABORATORY DATA:  I have reviewed the data as listed CBC Latest Ref Rng & Units 07/19/2020 06/28/2020 06/14/2020  WBC 4.0 - 10.5 K/uL 3.9(L) 4.4 4.1  Hemoglobin 12.0 - 15.0 g/dL 10.9(L) 11.2(L) 10.9(L)  Hematocrit 36.0 - 46.0 % 31.9(L) 32.2(L) 31.1(L)  Platelets 150 - 400 K/uL 181 182 152     CMP Latest Ref Rng & Units 07/19/2020 06/28/2020 06/14/2020  Glucose 70 - 99 mg/dL 157(H) 95 89  BUN 6 - 20 mg/dL $Remove'9 12 9  'AtoRBrx$ Creatinine 0.44 - 1.00 mg/dL 1.03(H) 1.11(H) 0.86  Sodium 135 - 145 mmol/L 142 139 141  Potassium 3.5 - 5.1 mmol/L 2.8(L) 3.4(L) 3.2(L)  Chloride 98 - 111 mmol/L 104 103 105  CO2 22 - 32 mmol/L $RemoveB'24 27 26  'vfvSiczL$ Calcium 8.9 - 10.3 mg/dL 9.4 9.4 9.2  Total Protein 6.5 - 8.1 g/dL 6.9 6.9 6.5  Total Bilirubin 0.3 - 1.2 mg/dL 2.6(H) 2.4(H) 2.0(H)  Alkaline Phos 38 - 126 U/L 84 70 77  AST 15 - 41 U/L $Remo'15 21 16  'guvKV$ ALT 0 - 44 U/L $Remo'13 15 12      'eYTGo$ RADIOGRAPHIC STUDIES: I have personally reviewed the radiological images as listed and agreed with the findings in the report. NM PET Image Restag (PS) Skull Base To Thigh  Result Date: 07/18/2020 CLINICAL DATA:  Subsequent treatment strategy for 55 year old female with breast cancer metastatic to bone. EXAM: NUCLEAR MEDICINE PET SKULL BASE TO THIGH TECHNIQUE: 6.7 mCi F-18 FDG was injected intravenously. Full-ring PET imaging was performed from the skull base to thigh after the radiotracer. CT data was obtained and used for attenuation correction and anatomic localization. Fasting blood glucose: 104 mg/dl COMPARISON:  Multiple prior studies most recent of 12/13/2019 in terms of PET evaluation, prior CT of the chest abdomen and pelvis from April 09, 2020 as well. FINDINGS: Mediastinal blood pool activity: SUV  max 2.29 Liver activity: SUV max NA NECK: No hypermetabolic lymph nodes in the neck. Incidental CT findings: none CHEST: No hypermetabolic mediastinal or hilar nodes. No suspicious pulmonary nodules on the CT scan. Incidental CT findings: Mild calcified atheromatous plaque of the thoracic aorta. Normal heart size without pericardial effusion. Normal caliber of central pulmonary vasculature. Signs of RIGHT axillary dissection, mass ectomy and breast reconstruction. LEFT-sided Port-A-Cath terminates at the caval to atrial junction as before. Lungs are clear. Airways are patent. ABDOMEN/PELVIS: No abnormal hypermetabolic activity within the liver, pancreas, adrenal glands, or spleen. No hypermetabolic lymph nodes in the abdomen or pelvis. Incidental CT findings: Post cholecystectomy. No acute findings relative to liver, spleen, pancreas, adrenal glands or kidneys. No hydronephrosis. Urinary bladder is collapsed. No acute bowel findings. Scattered aortic atherosclerosis without aneurysm. Smooth contour of the IVC. Abdominal wall postoperative changes related to reconstruction of the RIGHT breast. SKELETON: Interval worsening of skeletal metastases with new and worsening of existing lesions. New focal area of uptake in LEFT lateral aspect of C1 (image 18/4) maximum SUV of 5.8. New area of uptake at C7 with a maximum SUV of 6.9. Mixed lytic and sclerotic changes on CT appears similar to the prior study, activity is new. T1 sclerosis with similar appearance but with worsening of FDG activity which was only mild on the LEFT before and is now associated with the RIGHT vertebral body and seen throughout posterior elements (image 45/4) maximum SUV of 19, area seen on the LEFT on the prior study in this location showed a maximum SUV of  7.5 for reference. Increased metabolic activity in rib lesions as compared to the previous study not as pronounced as the above findings. LEFT scapular lesion with no distinct radiographic  correlate about the glenoid showing a maximum SUV of 7.4 as compared to 5.0 on the prior exam. Decreased metabolic activity is noted within RIGHT scapular lesion. At maximum SUV of 3.7 as compared to 18.5. T9 uptake is similar to the prior exam. Increased metabolic activity about the lower thoracic spine shows potential decreased potentially at the T12 level. Increased activity in the sternum now with a maximum SUV of 9.2, previously quite subtle. Diffuse uptake in the LEFT sacrum and LEFT iliac bone with a maximum SUV of 13.6 as compared to 12.5 (image 154/4. Increased uptake and more diffuse uptake in the RIGHT iliac bone when compared to the prior study (image 153/4) maximum SUV of 7.2 where there was a very small focus of activity less than a cm in terms of FDG uptake there is now more diffuse uptake with subtle underlying heterogeneity area of uptake measuring approximately 2.5 cm. More pronounced activity also in the LEFT acetabulum than on the previous exam without visible CT correlate. Maximum SUV currently 17.3, previously 5.7 Similar appearance of uptake in the RIGHT pubic bone with increased uptake in the LEFT ischium (image 183/4) subtle sclerosis with a maximum SUV of 15.7 previously only faintly hypermetabolic. By CT there is very little change in the appearance Incidental CT findings: Little interval change with respect to this CT appearance of bony metastatic lesions when compared to prior imaging, some areas of subtle lucency most areas with subtle sclerosis. Signs of LEFT proximal femoral ORIF as before. IMPRESSION: No signs of visceral or nodal metastatic disease. Increased number of hypermetabolic lesions with increased intensity of many of these areas and increased "size" of affected areas based on the regions of FDG uptake. While there are areas that do show diminished FDG uptake such as in the RIGHT scapula on balance the number and size of these areas has increased. Electronically Signed    By: Zetta Bills M.D.   On: 07/18/2020 14:18   ECHOCARDIOGRAM COMPLETE  Result Date: 07/18/2020    ECHOCARDIOGRAM REPORT   Patient Name:   KYLII ENNIS Date of Exam: 07/18/2020 Medical Rec #:  [id number]         Height:       65.0 in Accession #:    [id number]        Weight:       134.8 lb Date of Birth:  06/02/1965         BSA:          1.673 m Patient Age:    62 years          BP:           125/69 mmHg Patient Gender: F                 HR:           65 bpm. Exam Location:  Outpatient Procedure: 2D Echo, Cardiac Doppler, Color Doppler and Strain Analysis Indications:    Chemo Z09  History:        Patient has prior history of Echocardiogram examinations, most                 recent 11/17/2019. Breast cancer.  Sonographer:    Vickie Epley RDCS Referring Phys: [id number] [place]  1. Left ventricular ejection fraction, by estimation, is  50 to 55%. The left ventricle has low normal function. Left ventricular endocardial border not optimally defined to evaluate regional wall motion. Left ventricular diastolic parameters are consistent with Grade I diastolic dysfunction (impaired relaxation). The average left ventricular global longitudinal strain is -16.6 %.  2. Right ventricular systolic function is normal. The right ventricular size is normal. There is normal pulmonary artery systolic pressure. The estimated right ventricular systolic pressure is 17.7 mmHg.  3. The mitral valve is normal in structure. No evidence of mitral valve regurgitation.  4. The aortic valve is tricuspid. Aortic valve regurgitation is not visualized. No aortic stenosis is present.  5. The inferior vena cava is normal in size with greater than 50% respiratory variability, suggesting right atrial pressure of 3 mmHg. FINDINGS  Left Ventricle: Left ventricular ejection fraction, by estimation, is 50 to 55%. The left ventricle has low normal function. Left ventricular endocardial border not optimally defined to evaluate regional wall  motion. The average left ventricular global longitudinal strain is -16.6 %. The left ventricular internal cavity size was normal in size. There is no left ventricular hypertrophy. Left ventricular diastolic parameters are consistent with Grade I diastolic dysfunction (impaired relaxation). Right Ventricle: The right ventricular size is normal. No increase in right ventricular wall thickness. Right ventricular systolic function is normal. There is normal pulmonary artery systolic pressure. The tricuspid regurgitant velocity is 2.04 m/s, and  with an assumed right atrial pressure of 3 mmHg, the estimated right ventricular systolic pressure is 93.9 mmHg. Left Atrium: Left atrial size was normal in size. Right Atrium: Right atrial size was normal in size. Pericardium: There is no evidence of pericardial effusion. Mitral Valve: The mitral valve is normal in structure. No evidence of mitral valve regurgitation. Tricuspid Valve: The tricuspid valve is normal in structure. Tricuspid valve regurgitation is trivial. Aortic Valve: The aortic valve is tricuspid. Aortic valve regurgitation is not visualized. No aortic stenosis is present. Pulmonic Valve: The pulmonic valve was not well visualized. Pulmonic valve regurgitation is trivial. Aorta: The aortic root is normal in size and structure. Venous: The inferior vena cava is normal in size with greater than 50% respiratory variability, suggesting right atrial pressure of 3 mmHg. IAS/Shunts: The interatrial septum was not well visualized.  LEFT VENTRICLE PLAX 2D LVIDd:         4.70 cm      Diastology LVIDs:         3.50 cm      LV e' medial:    5.43 cm/s LV PW:         0.70 cm      LV E/e' medial:  8.8 LV IVS:        0.70 cm      LV e' lateral:   7.22 cm/s LVOT diam:     2.20 cm      LV E/e' lateral: 6.6 LV SV:         60 LV SV Index:   36           2D Longitudinal Strain LVOT Area:     3.80 cm     2D Strain GLS Avg:     -16.6 %  LV Volumes (MOD) LV vol d, MOD A2C: 112.0 ml LV  vol d, MOD A4C: 109.0 ml LV vol s, MOD A2C: 52.9 ml LV vol s, MOD A4C: 54.5 ml LV SV MOD A2C:     59.1 ml LV SV MOD A4C:     109.0 ml LV  SV MOD BP:      56.5 ml RIGHT VENTRICLE RV S prime:     9.73 cm/s TAPSE (M-mode): 2.0 cm LEFT ATRIUM           Index       RIGHT ATRIUM          Index LA diam:      3.50 cm 2.09 cm/m  RA Area:     9.59 cm LA Vol (A2C): 33.6 ml 20.09 ml/m RA Volume:   19.30 ml 11.54 ml/m LA Vol (A4C): 15.5 ml 9.27 ml/m  AORTIC VALVE LVOT Vmax:   74.80 cm/s LVOT Vmean:  51.100 cm/s LVOT VTI:    0.159 m  AORTA Ao Root diam: 3.20 cm MITRAL VALVE               TRICUSPID VALVE MV Area (PHT): 4.80 cm    TR Peak grad:   16.6 mmHg MV Decel Time: 158 msec    TR Vmax:        204.00 cm/s MV E velocity: 48.00 cm/s MV A velocity: 58.80 cm/s  SHUNTS MV E/A ratio:  0.82        Systemic VTI:  0.16 m                            Systemic Diam: 2.20 cm Oswaldo Milian MD Electronically signed by Oswaldo Milian MD Signature Date/Time: 07/18/2020/10:41:05 PM    Final      ASSESSMENT & PLAN:  MAYAN DOLNEY is a 55 y.o. female with   1. 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 She had right mastectomy, 4 cycles of AC-T and 5 years of Tamoxifen/AI. 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  -After her first line of chemo treatment and initial maintenance therapy, she progressed or had poor toleration of multiple lines of treatment since then.  -Based on 12/13/19 PET, she progressed diffusely in the bone on 7th line therapy with anti-HER2 IV Margetuximab q3weeks with IV Chemo weekly Gemcitabine (Carboplatin added C3) 2 weeks on/1 week off after 5 cycles, 09/15/19-12/22/19.  -I previously discussed the longer she is on treatment the less treatment options and efficacy she will have. -I started her on Xeloda $RemoveB'1000mg'RRpJjzlQ$  BID 2 weeks on/1 week off for first cycle on 01/19/20.  Continue Herceptin q3weeks. Increased to $RemoveBefo'1500mg'DFEiymoaoOT$  in the AM and $Remo'1000mg'zLOJs$  in the PM since C2. Given worsened hyperbilirubinemia, Xeloda was held 04/01/20-04/19/20 then reduced to $RemoveBe'1000mg'gCHaZkJWl$  BID 1 week on/1 week off. She is tolerating well and liver function stable. -PET scan 07/18/20 showed: no visceral or nodal metastatic disease; increase in skeletal metastases. I discussed this with her today. I compared images from this PET with her previous. I printed some images for her to show her daughter. -She will finish her current cycle of Xeloda, and then we will change treatment. -I discussed switching to Cytoxan or CMF, as well as continuing with Herceptin. She took cytoxan previously in the adjuvant setting in 1999. I also discussed possibly adding Keytruda, but this will depend on insurance approval and drug replacement. We discussed that Lizabeth Leyden is not FDA approved for Her2 positive metastatic breast cancer but some clinical trial data showed  --Chemotherapy consent: Side effects including but does not not limited to, fatigue, nausea, vomiting, diarrhea, hair loss, neuropathy, fluid retention, renal and kidney dysfunction,  neutropenic fever, needed for blood transfusion, bleeding, autoimmune related pneumonitis, colitis, skin rash, thyroid and other endocrine disorders, were discussed with patient in great detail. She agrees to proceed. -The goal of therapy is palliative, to prolong her life and improve her quality of life -Labs reviewed, overall adequate to proceed with Herceptin today.  -When she returns for her next dose in 2 weeks, we will add CMF and maybe Keytruda.  2. Goal of care discussion, DNR/DNI -We previously reviewed goal of care and I recommend DNR/DNI, she agreed again today (07/19/20). -I again encouraged her to start setting up her living will. Her husband [name] [place] is automatically her HCPOA. She is considering changing her HCPOA to her daughter [name], whom she lives with. I will connect her with our  social worker to fill this out. -Though her care is palliative, our goal is to prolong her life.   3. Body pain, secondary to bone mets, Hypocalcemia, leg cramps   -She is being treated with Zometa q28months. -She will continue oral calcium and Vit D and Prilosec for Acid Reflux.  -Current pain includes, lower back, upper legs, left hip and right shoulder. PET 12/15/19 does indicate disease progression in her spine, pelvis and right scapula. She also has headaches occasionally.  -Her 03/20/20 CT CAP shows possible fracture around pin of left hip from prior surgery.  She was evaluated by orthopedic surgeon Dr. [name], who recommended conservative measurements -Her PET 07/18/20 showed worsening bone mets. -Her pain is stable, and she uses oxycodone maybe 1-2 times a day   4. Congenital deafness   5. 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 Mild, only in her feet. -Continue monitoring. Stable.    7. Mild hyperbilirubinemia -likely second to chemotherapy, fluctuates  -Worsened on 03/22/20 with total bilirubin 3.2. Increased again to 3.7 on 04/01/20. -There is subtle change in liver on her 04/09/20 CT CAP, but she declined MRI Liver for further evaluation. -Stable, 2.6 today (07/19/20)     PLAN:   -proceed with Herceptin today. -labs, f/u, and Herceptin in 2 weeks  -add CMF and possibly Keytruda if it's approved or replaced  -I will also refer her to our social worker to complete living will paperwork.    No problem-specific Assessment & Plan notes found for this encounter.   No orders of the defined types were placed in this encounter.  All questions were answered. The patient knows to call the clinic with any problems, questions or concerns. No barriers to learning was detected. The total time spent in the appointment was 40 minutes.     Truitt Merle, MD 07/19/2020   I, Wilburn Mylar, am acting as scribe for Truitt Merle, MD.   I have reviewed the above documentation for accuracy and completeness, and I agree with the above.

## 2020-07-19 NOTE — Progress Notes (Signed)
Pt. potassium 2.8, Dr. Burr Medico notified. Pt. agreeing for 1 dose of IV potassium and took 20 meq po. Declined to stay for further treatment.

## 2020-07-20 LAB — CANCER ANTIGEN 27.29: CA 27.29: 90.5 U/mL — ABNORMAL HIGH (ref 0.0–38.6)

## 2020-07-23 ENCOUNTER — Telehealth: Payer: Self-pay | Admitting: Hematology

## 2020-07-23 NOTE — Telephone Encounter (Signed)
Left message with follow-up appointments per 7/8 los.

## 2020-07-24 ENCOUNTER — Other Ambulatory Visit (HOSPITAL_COMMUNITY): Payer: Self-pay

## 2020-07-26 ENCOUNTER — Other Ambulatory Visit (HOSPITAL_COMMUNITY): Payer: Self-pay

## 2020-07-29 ENCOUNTER — Other Ambulatory Visit (HOSPITAL_COMMUNITY): Payer: Self-pay

## 2020-08-01 NOTE — Progress Notes (Signed)
[name]   [phone] Fax:([phone]   Clinic Follow up Note   Patient Care Team: [name], [name], [name] as PCP - General (Physician Assistant) [name], MD as Consulting Physician (Orthopedic Surgery)  Date of Service:  08/02/2020  CHIEF COMPLAINT: f/u of metastatic breast cancer  SUMMARY OF ONCOLOGIC HISTORY: Oncology History Overview Note   Cancer Staging No matching staging information was found for the patient.     [name], right ([place])  11/1997 Cancer Diagnosis   Patient had 1.4 cm poorly differentiated right breast carcinoma diagnosed in Nov 1999 at age 34, 1/7 axillary nodes involved, ER/PR and HER 2 positive. She had mastectomy with the 7 axillary node evaluation, 4 cycles of adriamycin/ cytoxan followed by taxotere, then five years of tamoxifen thru June 2005 (no herceptin in 1999). She had bilateral oophorectomy in May 2005. She was briefly on aromatase inhibitor after tamoxifen, but discontinued this herself due to poor tolerance.    04/04/2015 Genetic Testing   Negative genetic testing on the breast/ovarian cancer pnael.  The Breast/Ovarian gene panel offered by [facility] includes sequencing and rearrangement analysis for the following 20 genes:  ATM, BARD1, BRCA1, BRCA2, BRIP1, CDH1, CHEK2, EPCAM, FANCC, MLH1, MSH2, MSH6, NBN, PALB2, PMS2, PTEN, RAD51C, RAD51D, TP53, and XRCC2.   06/26/2015 Pathology Results   Initial Path Report Bone, curettage, Left femur met, intramedullary subtroch tissue - METASTATIC ADENOCARCINOMA.  Estrogen Receptor: 95%, POSITIVE, STRONG STAINING INTENSITY Progesterone Receptor: 2%, POSITIVE, STRONG  HER2 (+), IHC 3+    06/26/2015 Surgery   Biopsy of metastatic tissue and stabilization with Affixus trochanteric nail, proximal and distal interlock of left femur by Dr. [name]     06/28/2015 Imaging   CT Chest w/ Contrast 1. Extensive right internal mammary chain  lymphadenopathy consistent with metastatic breast cancer. 2. Lytic metastases involving the posterior elements at T1 with probable nondisplaced pathologic fracture of the spinous process. 3. No other evidence of thoracic metastatic disease. 4. Trace bilateral pleural effusions with associated bibasilar atelectasis. 5. Indeterminate right thyroid nodule, not previously imaged. This could be evaluated with thyroid ultrasound as clinically warranted.    06/29/2015 -  Chemotherapy   Zometa started monthly on 06/29/15 and switched to q52months from 01/08/17   07/01/2015 Imaging   Bone Scan 1. Increased activity noted throughout the left femur. Although metastatic disease cannot be excluded, these changes are most likely secondary to prior surgery. 2. No other focal abnormalities identified to suggest metastatic disease.    07/12/2015 - 07/26/2015 Radiation Therapy   Left femur, 30 Gy in 10 fractions by Dr. Sondra Come     07/14/2015 Initial Diagnosis   [name], right ([place])    07/19/2015 Imaging   Initial PET Scan 1. Severe multifocal osseous metastatic disease, much greater than anticipated based on prior imaging. 2. Multifocal nodal metastases involving the right internal mammary, prevascular and subpectoral lymph nodes. No axillary pulmonary involvement identified. 3. No distant extra osseous metastases.    07/30/2015 - 11/29/2015 Chemotherapy   Docetaxel, Herceptin and Perjeta every 3 weeks X6 cycles, pt tolerated moderately well, restaging scan showed excellent response     09/18/2015 Imaging   CT Head w/wo Contrast 1. No acute intracranial abnormality or significant interval change. 2. Stable minimal periventricular white matter hypoattenuation on the right. This may reflect a remote ischemic injury. 3. No evidence for metastatic disease to the brain. 4. No focal soft tissue lesion to explain  the patient's tenderness.    10/30/2015 Imaging   CT Abdomen Pelvis  w/ Contrast 1. Few mildly prominent fluid-filled loops of small bowel scattered throughout the abdomen, with intraluminal fluid density within the distal colon. Given the provided history, findings are suggestive of acute enteritis/diarrheal illness. 2. No other acute intra-abdominal or pelvic process identified. 3. Widespread osseous metastatic disease, better evaluated on most recent PET-CT from 07/19/2015. No pathologic fracture or other complication. 4. 11 mm hypodensity within the left kidney. This lesion measures intermediate density, and is indeterminate. While this lesion is similar in size relative to recent studies, this is increased in size relative to prior study from 2012. Further evaluation with dedicated renal mass protocol CT and/or MRI is recommended for complete characterization.    12/2015 - 05/14/2016 Chemotherapy   Maintenance Herceptin and pejeta every 3 weeks stopped on 05/14/16 due to disease progression    12/25/2015 Imaging   Restaging PET Scan 1. Markedly improved skeletal activity, with only several faint foci of residual accentuated metabolic activity, but resolution of the vast majority of the previously extensive osseous metastatic disease. 2. Resolution of the prior right internal mammary and right prevascular lymph nodes. 3. Residual subcutaneous edema and edema along fascia planes in the left upper thigh. This remains somewhat more than I would expect for placement of an IM nail 6 weeks ago. If there is leg swelling further down, consider Doppler venous ultrasound to rule out left lower extremity DVT. I do not perceive an obvious difference in density between the pelvic and common femoral veins on the noncontrast CT data. 4. Chronic right maxillary and right sphenoid sinusitis.    01/16/2016 - 10/20/2017 Anti-estrogen oral therapy   Letrozole 2.5 mg daily. She was switched to exemestane due to disease progression on 02/08/17. Stopped on 10/20/2017 when treatment changed  to chemo     03/01/2016 Miscellaneous   Patient presented to ED following a fall; she reports she landed on her back and is now experiencing back pain. The patient was evaluated and discharge home same day with pain control.    05/01/2016 Imaging   CT CAP IMPRESSION: 1. Stable exam.  No new or progressive disease identified. 2. No evidence for residual or recurrent adenopathy within the chest. 3. Stable bone metastasis.    05/01/2016 Imaging   BONE SCAN IMPRESSION: 1. Small focal uptake involving the anterior rib ends of the left second and right fourth ribs, new since the prior bone scan. The location of this uptake is more suggestive of a traumatic or inflammatory etiology as opposed to metastatic disease. No other evidence of metastatic disease. 2. There are other areas of stable uptake which are most likely degenerative/reactive, including right shoulder and cervical spine uptake and uptake along the right femur adjacent to the ORIF hardware.    05/20/2016 Imaging   NM PET Skull to Thigh IMPRESSION: 1. There multiple metastatic foci in the bony pelvis, including some new abnormal foci compare to the prior PET-CT, compatible with mildly progressive bony metastatic disease. Also, a left T11 vertebral hypermetabolic metastatic lesion is observed, new compared to the prior exam. 2. No active extraosseous malignancy is currently identified. 3. Right anterior fourth rib healing fracture, appears be    06/04/2016 - 01/08/2017 Chemotherapy   Second line chemotherapy Kadcyla every 3 weeks started on 06/04/16 and stopped on 01/08/17 due to mixed response.      10/05/2016 Imaging   CT CAP and Bone Scan:  Bones: left intramedullary rod and left  femoral head neck screw in place. In the proximal diaphysis of the left femur there is cortical thickening and irregularity. No lytic or blastic bone lesions. No fracture or vertebral endplate destruction.   No definite evidence of  recurrent disease in the chest, abdomen, or pelvis.    10/28/2016 Imaging   CT A/P IMPRESSION: No acute process demonstrated in the abdomen or pelvis. No evidence of bowel obstruction or inflammation.    11/04/2016 Imaging   DG foot complete left: IMPRESSION: No fracture or dislocation.  No soft tissue abnormality    11/23/2016 Imaging   CT RIGHT FEMUR IMPRESSION: Negative CT scan of the right thigh.  No visible metastatic disease.  CT LEFT FEMUR IMPRESSION: 1. Postsurgical changes related to prior cephalomedullary rod fixation of the left femur with linear lucency along the anterolateral proximal femoral cortex at level of the lesser trochanter, suspicious for nondisplaced fracture. 2. Slightly permeative appearance of the proximal left femoral cortex at the site of known prior osseous metastases is largely unchanged. 3. Unchanged patchy lucency and sclerosis along the anterior acetabulum, which appears to correspond with an area of faint uptake on the prior PET-CT, suspicious for osseous metastasis. These results will be called to the ordering clinician or representative by the Radiologist Assistant, and communication documented in the PACS or zVision Dashboard.        01/27/2017 Imaging   IMPRESSION: 1. Mixed response to osseous metastasis. Some hypermetabolic lesions are new and increasingly hypermetabolic, while another index lesion is decreasingly hypermetabolic. 2. No evidence of hypermetabolic soft tissue metastasis.      02/08/2017 - 10/19/2017 Antibody Plan   -Herceptin every 3 week since 02/08/17 -Oral Lanpantinib 1531m and decreased to 5053mdue to diarrhea and depression. Due to disease progression she swithced to Verzenio 1502mn 05/04/17. She did not toelrate well so she was switched to Ibrance 100m103m 05/31/17. Due to her neutropenia, Ibrance dose reduced to 75 mg daily, 3 weeks on, one-week off, stopped on 10/20/2017 due to disease progression.     04/30/2017 PET scan   IMPRESSION: 1. Primarily increase in hypermetabolism of osseous metastasis. An isolated right acetabular lesion is decreased in hypermetabolism since the prior exam. 2. No evidence of hypermetabolic soft tissue metastasis. 3. Similar non FDG avid right-sided thyroid nodule, favoring a benign etiology. Recommend attention on follow-up.    08/30/2017 PET scan   08/30/2017 PET Scan  IMPRESSION: 1. Worsening osseous metastatic disease. 2. Focal hypermetabolism in the central spinal canal the L1 level, similar to the prior exam. Metastatic implant cannot be excluded. 3. Slight enlargement of a right thyroid nodule which now appears more cystic in character. Consider further evaluation with thyroid ultrasound. If patient is clinically hyperthyroid, consider nuclear medicine thyroid uptake and scan    08/30/2017 Progression   08/30/2017 PET Scan  IMPRESSION: 1. Worsening osseous metastatic disease. 2. Focal hypermetabolism in the central spinal canal the L1 level, similar to the prior exam. Metastatic implant cannot be excluded. 3. Slight enlargement of a right thyroid nodule which now appears more cystic in character. Consider further evaluation with thyroid ultrasound. If patient is clinically hyperthyroid, consider nuclear medicine thyroid uptake and scan    09/29/2017 - 10/11/2017 Radiation Therapy   Pt received 27 Gy in 9 fractions directed at the areas of significant uptake noted on recent bone scan the right pelvis and right proximal femur, with Dr, KinaSondra Come 10/20/2017 - 04/07/2018 Chemotherapy   -Herceptin every 3 weeks starting 02/08/17, perjeta  added on 10/20/2017  -weekly Taxol started on 10/21/2017. Will reduce Taxol to 2 weeks on, 1 week off starting on 02/04/17.  Stoped due to mild disease progression    10/27/2017 Imaging   10/27/2017 CT AP IMPRESSION: 1. No acute abnormality. No evidence of bowel obstruction or acute bowel inflammation. Normal  appendix. 2. Stable patchy sclerotic osseous metastases throughout the visualized skeleton. No new or progressive metastatic disease in the abdomen or pelvis.    01/11/2018 Imaging   Whole Body Bone Scan 01/11/18  IMPRESSION: 1. Multifocal osseous metastases as described. 2. Right scapular metastasis. 3. Right superior thoracic spinous process. 4. Right sacral ala and right L5 metastases. 5. Right acetabular metastasis. 6. Right proximal femur metastasis. 7. Diffuse metastases throughout the left femur. 8. Anterior right fourth rib metastasis.    01/11/2018 Imaging   CT CAP W Contrast 01/11/18  IMPRESSION: 1. Similar-appearing osseous metastatic disease. 2. No evidence for progressed metastatic disease in the chest, abdomen or pelvis.    03/30/2018 Imaging   Bone Scan 03/30/18  IMPRESSION: Stable multifocal bony metastatic disease.    03/30/2018 Imaging    CT CAP W Contrast 03/30/18 IMPRESSION: 1. New mild contiguous wall thickening with associated mucosal hyperenhancement throughout the left colon and rectum, suggesting nonspecific infectious or inflammatory proctocolitis. Differential includes C diff colitis. 2. Stable faint patchy sclerosis in the iliac bones and lumbar vertebral bodies. No new or progressive osseous metastatic disease by CT. Please note that the osseous lesions are relatively occult by CT and would be better evaluated by PET-CT, as clinically warranted. 3. No evidence of extra osseous metastatic disease.   These results will be called to the ordering clinician or representative by the Radiologist Assistant, and communication documented in the PACS or zVision Dashboard.    04/15/2018 - 06/16/2018 Chemotherapy   Enhertu q3weeks starting 04/15/18. Stopped Enhertu after cycle 3 on 05/27/18 due to poor toleration due to poor toleration with anorexia, weight loss, N&V, diarrhea   04/21/2018 PET scan   PET 04/21/18  IMPRESSION: 1. Overall marked interval  improvement in bony hypermetabolic disease. All of the previously identified hypermetabolic osseous metastases have decreased in hypermetabolism in the interval. There is a new tiny focus of hypermetabolism in the right aspect of the L3 vertebral body that appears to correspond to a new 8 mm lucency, raising concern for new metastatic disease. Close attention on follow-up recommended. 2. Focal hypermetabolism in the central spinal canal at the level of L1 previously has resolved. 3. No hypermetabolic soft tissue disease on today's study. 4. Right thyroid nodule seen previously has decreased in the interval.      06/16/2018 Imaging   CT LUMBAR SPINE WO CONTRAST 06/16/18 IMPRESSION: 1. Transitional lumbosacral anatomy. Correlation with radiographs is recommended prior to any operative intervention. 2. Suspicion of a right L3 vertebral body metastasis on the April PET-CT is not evident by CT. Stable degenerative versus metastatic lesion at the right L5 superior endplate. Small but increased right iliac bone metastasis since March. Lumbar MRI would best characterize lumbar metastatic disease (without contrast if necessary). 3. Suspect L2-L3 left subarticular segment disc herniation. Query left L2 radiculitis. 4. Stable appearance of rightward L4-L5 disc degeneration which could affect the right foramen and right lateral recess.    06/17/2018 - 11/16/2018 Chemotherapy   Restart Taxol 2 weeks on/1 week off with Herceptin and Perjeta q3weeks on 06/17/18. Perjeta was stopped after C5 due to diarrhea. Stopped Taxol after 11/16/18 due to disease progression to L3.  07/06/2018 PET scan   PET  IMPRESSION: 1. Near total resolution of hypermetabolic activity associated with skeletal metastasis. Very mild activity associated with the LEFT iliac bone which is similar to background blood pool activity. 2. No new or progressive disease.   11/21/2018 PET scan   IMPRESSION: 1. New focal  hypermetabolic lesion eccentric to the right in the L3 vertebral body, maximum SUV 6.1 compatible with a new focus of active malignancy. 2. Very low-grade activity similar to blood pool in previous existing mildly sclerotic metastatic lesions including the left iliac bone lesion. Some of the mildly sclerotic lesions are not hypermetabolic compatible with effectively treated bony metastatic disease. 3. No hypermetabolic soft tissue lesions are identified.     11/30/2018 - 08/25/2019 Chemotherapy   Continue Herceptin q3weeks. Stopped 08/25/19 due to significant disease progression and switched to  IV Matuzumab   12/12/2018 - 12/17/2018 Chemotherapy   Tucatinib (TUKYSA) 6 tabs BID as a next line therapy starting 12/12/18. Stopped 12/17/18 duet o N&V, poor appetite and HA    12/21/2018 - 03/17/2019 Chemotherapy   IV Eribulin on day 1, 8 every 21 days starting 12/21/18. Stopped 03/17/19 due to mixed response to treatment.    03/21/2019 PET scan   IMPRESSION: 1. Mixed response to therapy of osseous metastasis. Although a right-sided L4 lesion demonstrates decreased hypermetabolism today, new left iliac and right ischial hypermetabolic lesions are seen. 2. No evidence of hypermetabolic soft tissue metastasis.     03/27/2019 - 07/21/2019 Chemotherapy   Neratinib (Nerlynx) titrate up from 113m to 2497mstarting 03/27/19. She could not tolerate 16034mnd stopped on 02/05/19 due to N&V and diarrhea. Will restart at 120m42m 04/10/18-04/27/19 and 4/19-4/22. Reduced to 80mg15me daily on 05/10/19. Held/stopped since 07/21/19 due to dry cough, Nausea and body aches.   06/23/2019 Imaging   CT Head  IMPRESSION: 1. No acute intracranial abnormality or metastatic disease identified. 2. Chronic white matter disease in the right hemisphere is stable from prior exams.     09/05/2019 PET scan   IMPRESSION: 1. Significant progression of widespread hypermetabolic osseous metastases throughout the axial skeleton as  detailed. 2. No hypermetabolic extraosseous sites of metastatic disease.   09/15/2019 - 12/22/2019 Chemotherapy   -IV Margetuximab q3weeks and IV Gemcitabine 2 weeks on/1 week off starting 09/15/19. Carboplatin added to Gemcitabine starting with C3 on 11/03/19. D/c after C5 due to disease progression in bone.    12/13/2019 Imaging   PET  IMPRESSION: 1. Interval progression of widespread, FDG avid osseous metastasis involving the axial skeleton. 2. No FDG avid extra osseous sites of metastatic disease.     01/19/2020 -  Chemotherapy   -Xeloda BID 2 weeks on/1 week off and Herceptin q3weeks starting 01/19/20. C1 at 1000mg 46m C2 increased to 1500mg i92me AM and 1000mg in24m PM starting 02/12/20.   04/09/2020 Imaging   CT CAP  IMPRESSION: 1. New 3 mm RIGHT upper lobe nodule, nonspecific, attention on follow-up. 2. What may represent an incomplete periprosthetic fracture about the cephalo medullary rod in the LEFT proximal femur. This linear band of incomplete bony fusion that is more pronounced than on previous CT imaging that was acquired in 2017 and may represent a stress type fracture about hardware. Correlate with any pain in this area. Sclerotic margins show chronic features but the linear band passing through the bone around the intramedullary rod is more extensive, involving approximately 270 degrees of the circumference of the femur, medial cortex remains intact. 3. Other findings  of bony metastatic disease are similar to prior imaging. Sclerosis and some periosteal reaction about a nondisplaced pathologic fracture of the LEFT lateral seventh rib. 4. Small hypervascular focus in the RIGHT hepatic lobe, indistinct but new compared to previous imaging. Perhaps this represents a transient phenomenon though is not imaged on the later phase to allow for confirmation. Liver MRI may be helpful for further evaluation, to exclude underlying metastatic lesion as clinically warranted. 5.  Hepatic steatosis. 6. Aortic atherosclerosis.   Aortic Atherosclerosis (ICD10-I70.0).     07/18/2020 Imaging   PET  IMPRESSION: No signs of visceral or nodal metastatic disease.   Increased number of hypermetabolic lesions with increased intensity of many of these areas and increased "size" of affected areas based on the regions of FDG uptake. While there are areas that do show diminished FDG uptake such as in the RIGHT scapula on balance the number and size of these areas has increased.   08/02/2020 -  Chemotherapy    Patient is on Treatment Plan: BREAST ADJUVANT CMF IV Q21D   Patient is on Antibody Plan: BREAST TRASTUZUMAB Q21D        CURRENT THERAPY: CHEMO CMF and Herceptin every 3 weeks   INTERVAL HISTORY:  [name] is here for a follow up of metastatic breast cancer. She was last seen by me on 07/19/20. She presents to the clinic accompanied by an interpreter.  She is clinically stable, still has body aches, takes oxycodone 1-2 a day  More fatigue lately but able to function well at home  No other new complains    All other systems were reviewed with the patient and are negative.  MEDICAL HISTORY:  Past Medical History:  Diagnosis Date   Abnormal Pap smear    Anxiety    Breast cancer ([place])    Cancer ([place]) 1999   breast-s/p mastectomy, chemo, rad   CIN I (cervical intraepithelial neoplasia I) 2003   by colpo   Congenital deafness    Deaf    Depression    Port-A-Cath in place 2017   for chemotherapy   Radiation 07/12/15-07/26/15   left femur 30 Gy   S/P bilateral oophorectomy     SURGICAL HISTORY: Past Surgical History:  Procedure Laterality Date   ABDOMINAL HYSTERECTOMY     INTRAMEDULLARY (IM) NAIL INTERTROCHANTERIC Left 06/26/2015   Procedure: LEFT BIOMET LONG AFFIXS NAIL;  Surgeon: [name], MD;  Location: [facility];  Service: Orthopedics;  Laterality: Left;   IR GENERIC HISTORICAL  08/06/2015   IR US GUIDE VASC ACCESS LEFT 08/06/2015 Aletta Edouard,  MD [facility]   IR GENERIC HISTORICAL  08/06/2015   IR FLUORO GUIDE CV LINE LEFT 08/06/2015 Aletta Edouard, MD [facility]   IR GENERIC HISTORICAL  03/05/2016   IR CV LINE INJECTION 03/05/2016 Markus Daft, MD [facility]   LEFT HEART CATH AND CORONARY ANGIOGRAPHY N/A 12/13/2017   Procedure: LEFT HEART CATH AND CORONARY ANGIOGRAPHY;  Surgeon: Leonie Man, MD;  Location: [facility];  Service: Cardiovascular;  Laterality: N/A;   MASTECTOMY     right breast   MASTECTOMY     OVARIAN CYST REMOVAL     RADIOLOGY WITH ANESTHESIA Left 06/25/2015   Procedure: MRI OF LEFT HIP WITH OR WITHOUT CONTRAST;  Surgeon: Medication Radiologist, MD;  Location: [facility];  Service: Radiology;  Laterality: Left;  DR. MCINTYRE/MRI   TUBAL LIGATION      I have reviewed the social history and family history with the  patient and they are unchanged from previous note.  ALLERGIES:  is allergic to promethazine hcl, chlorhexidine, and diphenhydramine hcl.  MEDICATIONS:  Current Outpatient Medications  Medication Sig Dispense Refill   ALPRAZolam (XANAX) 0.25 MG tablet Take 1 tablet (0.25 mg total) by mouth 2 (two) times daily as needed for anxiety. 30 tablet 0   capecitabine (XELODA) 500 MG tablet TAKE 2 TABS IN MORNING, AND 2 TABS IN EVENING. TAKE FOR 7 DAYS ON, 7 DAYS OFF. REPEAT EVERY 14 DAYS. START ON 04/19/2020 56 tablet 2   diphenoxylate-atropine (LOMOTIL) 2.5-0.025 MG tablet Take 1-2 tablets by mouth 4 (four) times daily as needed for diarrhea or loose stools. 60 tablet 1   lidocaine-prilocaine (EMLA) cream Apply 1 application topically as needed. Apply to Porta-Cath 1-2 hours prior to access as directed. 90 g 2   ondansetron (ZOFRAN) 8 MG tablet Take 1 tablet (8 mg total) by mouth every 8 (eight) hours as needed for nausea or vomiting. 45 tablet 1   oxyCODONE (ROXICODONE) 5 MG immediate release tablet Take 1 tablet (5 mg total) by mouth every 8 (eight) hours as needed for severe pain. Refill on 02/17/2020 90  tablet 0   potassium chloride (KLOR-CON) 10 MEQ tablet Take 1 tablet (10 mEq total) by mouth 3 (three) times daily. 90 tablet 2   prochlorperazine (COMPAZINE) 10 MG tablet Take 1 tablet (10 mg total) by mouth 2 (two) times daily as needed for nausea or vomiting. 20 tablet 1   zolpidem (AMBIEN) 10 MG tablet Take 1 tablet (10 mg total) by mouth at bedtime as needed for sleep. 30 tablet 0   No current facility-administered medications for this visit.   Facility-Administered Medications Ordered in Other Visits  Medication Dose Route Frequency Provider Last Rate Last Admin   potassium chloride SA (K-DUR) CR tablet 40 mEq  40 mEq Oral Once Truitt Merle, MD       sodium chloride 0.9 % 1,000 mL with potassium chloride 10 mEq infusion   Intravenous Continuous Livesay, Tamala Julian, MD   Stopped at 09/10/15 1653   sodium chloride 0.9 % injection 10 mL  10 mL Intravenous PRN Livesay, Lennis P, MD        PHYSICAL EXAMINATION: ECOG PERFORMANCE STATUS: 1 - Symptomatic but completely ambulatory  Vitals:   08/02/20 0950  BP: 110/68  Pulse: 73  Resp: 18  Temp: 97.7 F (36.5 C)  SpO2: 100%   Filed Weights   08/02/20 0950  Weight: 133 lb 11.2 oz (60.6 kg)    GENERAL:alert, no distress and comfortable SKIN: skin color, texture, turgor are normal, no rashes or significant lesions EYES: normal, Conjunctiva are pink and non-injected, sclera clear  NECK: supple, thyroid normal size, non-tender, without nodularity LYMPH:  no palpable lymphadenopathy in the cervical, axillary  LUNGS: clear to auscultation and percussion with normal breathing effort HEART: regular rate & rhythm and no murmurs and no lower extremity edema ABDOMEN:abdomen soft, non-tender and normal bowel sounds Musculoskeletal:no cyanosis of digits and no clubbing  NEURO: alert & oriented x 3 with fluent speech, no focal motor/sensory deficits  LABORATORY DATA:  I have reviewed the data as listed CBC Latest Ref Rng & Units 08/02/2020  07/19/2020 06/28/2020  WBC 4.0 - 10.5 K/uL 4.0 3.9(L) 4.4  Hemoglobin 12.0 - 15.0 g/dL 11.5(L) 10.9(L) 11.2(L)  Hematocrit 36.0 - 46.0 % 32.1(L) 31.9(L) 32.2(L)  Platelets 150 - 400 K/uL 215 181 182     CMP Latest Ref Rng & Units 08/02/2020 07/19/2020 06/28/2020  Glucose 70 - 99 mg/dL 129(H) 157(H) 95  BUN 6 - 20 mg/dL $Remove'13 9 12  'sepuQcX$ Creatinine 0.44 - 1.00 mg/dL 1.11(H) 1.03(H) 1.11(H)  Sodium 135 - 145 mmol/L 140 142 139  Potassium 3.5 - 5.1 mmol/L 3.0(L) 2.8(L) 3.4(L)  Chloride 98 - 111 mmol/L 103 104 103  CO2 22 - 32 mmol/L $RemoveB'26 24 27  'dURLImlr$ Calcium 8.9 - 10.3 mg/dL 9.4 9.4 9.4  Total Protein 6.5 - 8.1 g/dL 7.2 6.9 6.9  Total Bilirubin 0.3 - 1.2 mg/dL 2.1(H) 2.6(H) 2.4(H)  Alkaline Phos 38 - 126 U/L 71 84 70  AST 15 - 41 U/L $Remo'26 15 21  'WBiGq$ ALT 0 - 44 U/L $Remo'17 13 15      'SJTLY$ RADIOGRAPHIC STUDIES: I have personally reviewed the radiological images as listed and agreed with the findings in the report. No results found.   ASSESSMENT & PLAN:  [name] is a 55 y.o. female with   1. 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 She had right mastectomy, 4 cycles of AC-T and 5 years of Tamoxifen/AI. 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  -After her first line of chemo treatment and initial maintenance therapy, she progressed or had poor toleration of multiple lines of treatment since then.  -Based on 12/13/19 PET, she progressed diffusely in the bone on 7th line therapy with anti-HER2 IV Margetuximab q3weeks with IV Chemo weekly Gemcitabine (Carboplatin added C3) 2 weeks on/1 week off after 5 cycles, 09/15/19-12/22/19.  -I previously discussed the longer she is on treatment the less treatment options and efficacy she will have. -I started her on Xeloda $RemoveB'1000mg'igJumiWS$  BID 2 weeks on/1 week off for first cycle on 01/19/20. Continue Herceptin q3weeks. Increased to $RemoveBefo'1500mg'vSosOxgFBPg$  in the AM and $Remo'1000mg'gNhbq$  in  the PM since C2. Given worsened hyperbilirubinemia, Xeloda was held 04/01/20-04/19/20 then reduced to $RemoveBe'1000mg'xbcQDfsqs$  BID 1 week on/1 week off. She is tolerating well and liver function stable. -PET scan 07/18/20 showed: no visceral or nodal metastatic disease; increase in skeletal metastases.  -We are switching her treatment to CMF, and Keytruda, and continue herceptin. -I reviewed the potential side effect from chemo and immunotherapy with patient again today, chemo consent obtained, lab reviewed, adequate for treatment, will proceed to treatment today -Lab, follow-up and cycle 2 treatment in 3 weeks    2. Goal of care discussion, DNR/DNI -We previously reviewed goal of care and I recommend DNR/DNI, she agreed again today (07/19/20). -I again encouraged her to start setting up her living will. Her husband [name] Kaltag is automatically her HCPOA. She is considering changing her HCPOA to her daughter [name], whom she lives with. I will connect her with our social worker to fill this out. -Though her care is palliative, our goal is to prolong her life.   3. Body pain, secondary to bone mets, Hypocalcemia, leg cramps   -She is being treated with Zometa q94months. -She will continue oral calcium and Vit D and Prilosec for Acid Reflux.  -Current pain includes, lower back, upper legs, left hip and right shoulder. PET 12/15/19 does indicate disease progression in her spine, pelvis and right scapula. She also has headaches occasionally.  -Her 03/20/20 CT CAP shows possible fracture around pin of left hip from prior surgery.  She was evaluated by orthopedic surgeon Dr. [name], who recommended conservative measurements -Her PET 07/18/20 showed worsening bone mets. -Her pain is stable, and  she uses oxycodone maybe 1-2 times a day -Patient is currently taking potassium 2 tablets a day, I recommend increase to 3 tablets a day   4. Congenital deafness   5. 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 Mild, only in her feet. -Continue monitoring. Stable.    7. Mild hyperbilirubinemia -likely second to chemotherapy, fluctuates  -Worsened on 03/22/20 with total bilirubin 3.2. Increased again to 3.7 on 04/01/20. -There is subtle change in liver on her 04/09/20 CT CAP, but she declined MRI Liver for further evaluation. -Stable overall      PLAN:   -proceed with C1 CMF, Keytruda and Herceptin today. -lab, flush, f/u and chemo in 3 weeks       No problem-specific Assessment & Plan notes found for this encounter.   No orders of the defined types were placed in this encounter.  All questions were answered. The patient knows to call the clinic with any problems, questions or concerns. No barriers to learning was detected. The total time spent in the appointment was 30 minutes.     Truitt Merle, MD 08/02/2020   I, Wilburn Mylar, am acting as scribe for Truitt Merle, MD.   I have reviewed the above documentation for accuracy and completeness, and I agree with the above.

## 2020-08-02 ENCOUNTER — Inpatient Hospital Stay (HOSPITAL_BASED_OUTPATIENT_CLINIC_OR_DEPARTMENT_OTHER): Payer: Medicaid Other | Admitting: Hematology

## 2020-08-02 ENCOUNTER — Other Ambulatory Visit: Payer: Self-pay

## 2020-08-02 ENCOUNTER — Inpatient Hospital Stay: Payer: Medicaid Other

## 2020-08-02 VITALS — BP 110/68 | HR 73 | Temp 97.7°F | Resp 18 | Wt 133.7 lb

## 2020-08-02 DIAGNOSIS — C50911 Malignant neoplasm of unspecified site of right female breast: Secondary | ICD-10-CM | POA: Diagnosis not present

## 2020-08-02 DIAGNOSIS — Z95828 Presence of other vascular implants and grafts: Secondary | ICD-10-CM

## 2020-08-02 DIAGNOSIS — C7951 Secondary malignant neoplasm of bone: Secondary | ICD-10-CM

## 2020-08-02 DIAGNOSIS — Z5112 Encounter for antineoplastic immunotherapy: Secondary | ICD-10-CM | POA: Diagnosis not present

## 2020-08-02 LAB — CBC WITH DIFFERENTIAL (CANCER CENTER ONLY)
Abs Immature Granulocytes: 0.02 10*3/uL (ref 0.00–0.07)
Basophils Absolute: 0 10*3/uL (ref 0.0–0.1)
Basophils Relative: 1 %
Eosinophils Absolute: 0.1 10*3/uL (ref 0.0–0.5)
Eosinophils Relative: 3 %
HCT: 32.1 % — ABNORMAL LOW (ref 36.0–46.0)
Hemoglobin: 11.5 g/dL — ABNORMAL LOW (ref 12.0–15.0)
Immature Granulocytes: 1 %
Lymphocytes Relative: 34 %
Lymphs Abs: 1.4 10*3/uL (ref 0.7–4.0)
MCH: 33.8 pg (ref 26.0–34.0)
MCHC: 35.8 g/dL (ref 30.0–36.0)
MCV: 94.4 fL (ref 80.0–100.0)
Monocytes Absolute: 0.2 10*3/uL (ref 0.1–1.0)
Monocytes Relative: 6 %
Neutro Abs: 2.3 10*3/uL (ref 1.7–7.7)
Neutrophils Relative %: 55 %
Platelet Count: 215 10*3/uL (ref 150–400)
RBC: 3.4 MIL/uL — ABNORMAL LOW (ref 3.87–5.11)
RDW: 14.1 % (ref 11.5–15.5)
WBC Count: 4 10*3/uL (ref 4.0–10.5)
nRBC: 0 % (ref 0.0–0.2)

## 2020-08-02 LAB — CMP (CANCER CENTER ONLY)
ALT: 17 U/L (ref 0–44)
AST: 26 U/L (ref 15–41)
Albumin: 4.1 g/dL (ref 3.5–5.0)
Alkaline Phosphatase: 71 U/L (ref 38–126)
Anion gap: 11 (ref 5–15)
BUN: 13 mg/dL (ref 6–20)
CO2: 26 mmol/L (ref 22–32)
Calcium: 9.4 mg/dL (ref 8.9–10.3)
Chloride: 103 mmol/L (ref 98–111)
Creatinine: 1.11 mg/dL — ABNORMAL HIGH (ref 0.44–1.00)
GFR, Estimated: 59 mL/min — ABNORMAL LOW (ref >60)
Glucose, Bld: 129 mg/dL — ABNORMAL HIGH (ref 70–99)
Potassium: 3 mmol/L — ABNORMAL LOW (ref 3.5–5.1)
Sodium: 140 mmol/L (ref 135–145)
Total Bilirubin: 2.1 mg/dL — ABNORMAL HIGH (ref 0.3–1.2)
Total Protein: 7.2 g/dL (ref 6.5–8.1)

## 2020-08-02 MED ORDER — PALONOSETRON HCL INJECTION 0.25 MG/5ML
0.2500 mg | Freq: Once | INTRAVENOUS | Status: AC
  Administered 2020-08-02: 0.25 mg via INTRAVENOUS

## 2020-08-02 MED ORDER — SODIUM CHLORIDE 0.9 % IV SOLN
600.0000 mg/m2 | Freq: Once | INTRAVENOUS | Status: AC
  Administered 2020-08-02: 1000 mg via INTRAVENOUS
  Filled 2020-08-02: qty 50

## 2020-08-02 MED ORDER — ACETAMINOPHEN 325 MG PO TABS
ORAL_TABLET | ORAL | Status: AC
  Filled 2020-08-02: qty 2

## 2020-08-02 MED ORDER — METHOTREXATE SODIUM (PF) CHEMO INJECTION 250 MG/10ML
40.1000 mg/m2 | Freq: Once | INTRAMUSCULAR | Status: AC
  Administered 2020-08-02: 67 mg via INTRAVENOUS
  Filled 2020-08-02: qty 2.68

## 2020-08-02 MED ORDER — ACETAMINOPHEN 325 MG PO TABS
650.0000 mg | ORAL_TABLET | Freq: Once | ORAL | Status: AC
  Administered 2020-08-02: 650 mg via ORAL

## 2020-08-02 MED ORDER — LORATADINE 10 MG PO TABS
ORAL_TABLET | ORAL | Status: AC
  Filled 2020-08-02: qty 1

## 2020-08-02 MED ORDER — PALONOSETRON HCL INJECTION 0.25 MG/5ML
INTRAVENOUS | Status: AC
  Filled 2020-08-02: qty 5

## 2020-08-02 MED ORDER — ZOLEDRONIC ACID 4 MG/100ML IV SOLN
INTRAVENOUS | Status: AC
  Filled 2020-08-02: qty 100

## 2020-08-02 MED ORDER — HEPARIN SOD (PORK) LOCK FLUSH 100 UNIT/ML IV SOLN
500.0000 [IU] | Freq: Once | INTRAVENOUS | Status: DC | PRN
  Filled 2020-08-02: qty 5

## 2020-08-02 MED ORDER — SODIUM CHLORIDE 0.9% FLUSH
10.0000 mL | INTRAVENOUS | Status: DC | PRN
  Administered 2020-08-02: 10 mL via INTRAVENOUS
  Filled 2020-08-02: qty 10

## 2020-08-02 MED ORDER — SODIUM CHLORIDE 0.9 % IV SOLN
Freq: Once | INTRAVENOUS | Status: AC
  Filled 2020-08-02: qty 250

## 2020-08-02 MED ORDER — FLUOROURACIL CHEMO INJECTION 2.5 GM/50ML
600.0000 mg/m2 | Freq: Once | INTRAVENOUS | Status: AC
  Administered 2020-08-02: 1000 mg via INTRAVENOUS
  Filled 2020-08-02: qty 20

## 2020-08-02 MED ORDER — SODIUM CHLORIDE 0.9% FLUSH
10.0000 mL | INTRAVENOUS | Status: DC | PRN
  Filled 2020-08-02: qty 10

## 2020-08-02 MED ORDER — LORATADINE 10 MG PO TABS
10.0000 mg | ORAL_TABLET | Freq: Once | ORAL | Status: AC
  Administered 2020-08-02: 10 mg via ORAL

## 2020-08-02 MED ORDER — TRASTUZUMAB-DKST CHEMO 150 MG IV SOLR
6.0000 mg/kg | Freq: Once | INTRAVENOUS | Status: AC
  Administered 2020-08-02: 357 mg via INTRAVENOUS
  Filled 2020-08-02: qty 17

## 2020-08-02 MED ORDER — SODIUM CHLORIDE 0.9 % IV SOLN
Freq: Once | INTRAVENOUS | Status: DC
  Filled 2020-08-02: qty 250

## 2020-08-02 MED ORDER — ZOLEDRONIC ACID 4 MG/100ML IV SOLN
4.0000 mg | Freq: Once | INTRAVENOUS | Status: AC
  Administered 2020-08-02: 4 mg via INTRAVENOUS
  Filled 2020-08-02: qty 100

## 2020-08-02 MED ORDER — SODIUM CHLORIDE 0.9 % IV SOLN
200.0000 mg | Freq: Once | INTRAVENOUS | Status: AC
  Administered 2020-08-02: 200 mg via INTRAVENOUS
  Filled 2020-08-02: qty 8

## 2020-08-02 NOTE — Patient Instructions (Addendum)
[name]  Discharge Instructions: Thank you for choosing [facility] to provide your oncology and hematology care.   If you have a lab appointment with the [facility], please go directly to the [facility] and check in at the registration area.   Wear comfortable clothing and clothing appropriate for easy access to any Portacath or PICC line.   We strive to give you quality time with your provider. You may need to reschedule your appointment if you arrive late (15 or more minutes).  Arriving late affects you and other patients whose appointments are after yours.  Also, if you miss three or more appointments without notifying the office, you may be dismissed from the clinic at the provider's discretion.      For prescription refill requests, have your pharmacy contact our office and allow 72 hours for refills to be completed.    Today you received the following chemotherapy and/or immunotherapy agents: Ogivri, Keytruda, Cytoxan, Methotrexate, Fluorouracil      To help prevent nausea and vomiting after your treatment, we encourage you to take your nausea medication as directed.  BELOW ARE SYMPTOMS THAT SHOULD BE REPORTED IMMEDIATELY: *FEVER GREATER THAN 100.4 F (38 C) OR HIGHER *CHILLS OR SWEATING *NAUSEA AND VOMITING THAT IS NOT CONTROLLED WITH YOUR NAUSEA MEDICATION *UNUSUAL SHORTNESS OF BREATH *UNUSUAL BRUISING OR BLEEDING *URINARY PROBLEMS (pain or burning when urinating, or frequent urination) *BOWEL PROBLEMS (unusual diarrhea, constipation, pain near the anus) TENDERNESS IN MOUTH AND THROAT WITH OR WITHOUT PRESENCE OF ULCERS (sore throat, sores in mouth, or a toothache) UNUSUAL RASH, SWELLING OR PAIN  UNUSUAL VAGINAL DISCHARGE OR ITCHING   Items with * indicate a potential emergency and should be followed up as soon as possible or go to the Emergency Department if any problems should occur.  Please show the CHEMOTHERAPY ALERT CARD  or IMMUNOTHERAPY ALERT CARD at check-in to the Emergency Department and triage nurse.  Should you have questions after your visit or need to cancel or reschedule your appointment, please contact [facility]  Dept: [phone]  and follow the prompts.  Office hours are 8:00 a.m. to 4:30 p.m. Monday - Friday. Please note that voicemails left after 4:00 p.m. may not be returned until the following business day.  We are closed weekends and major holidays. You have access to a nurse at all times for urgent questions. Please call the main number to the clinic Dept: [phone] and follow the prompts.   For any non-urgent questions, you may also contact your provider using MyChart. We now offer e-Visits for anyone 36 and older to request care online for non-urgent symptoms. For details visit [email or web address].   Also download the MyChart app! Go to the app store, search "MyChart", open the app, select [facility], and log in with your MyChart username and password.  Due to Covid, a mask is required upon entering the hospital/clinic. If you do not have a mask, one will be given to you upon arrival. For doctor visits, patients may have 1 support person aged 66 or older with them. For treatment visits, patients cannot have anyone with them due to current Covid guidelines and our immunocompromised population.   Pembrolizumab injection What is this medication? PEMBROLIZUMAB (pem broe liz ue mab) is a monoclonal antibody. It is used totreat certain types of cancer. This medicine may be used for other purposes; ask your health care provider orpharmacist if you have questions. COMMON BRAND NAME(S):  Keytruda What should I tell my care team before I take this medication? They need to know if you have any of these conditions: autoimmune diseases like Crohn's disease, ulcerative colitis, or lupus have had or planning to have an allogeneic stem cell transplant (uses someone  else's stem cells) history of organ transplant history of chest radiation nervous system problems like myasthenia gravis or Guillain-Barre syndrome an unusual or allergic reaction to pembrolizumab, other medicines, foods, dyes, or preservatives pregnant or trying to get pregnant breast-feeding How should I use this medication? This medicine is for infusion into a vein. It is given by a health careprofessional in a hospital or clinic setting. A special MedGuide will be given to you before each treatment. Be sure to readthis information carefully each time. Talk to your pediatrician regarding the use of this medicine in children. While this drug may be prescribed for children as young as 6 months for selectedconditions, precautions do apply. Overdosage: If you think you have taken too much of this medicine contact apoison control center or emergency room at once. NOTE: This medicine is only for you. Do not share this medicine with others. What if I miss a dose? It is important not to miss your dose. Call your doctor or health careprofessional if you are unable to keep an appointment. What may interact with this medication? Interactions have not been studied. This list may not describe all possible interactions. Give your health care provider a list of all the medicines, herbs, non-prescription drugs, or dietary supplements you use. Also tell them if you smoke, drink alcohol, or use illegaldrugs. Some items may interact with your medicine. What should I watch for while using this medication? Your condition will be monitored carefully while you are receiving thismedicine. You may need blood work done while you are taking this medicine. Do not become pregnant while taking this medicine or for 4 months after stopping it. Women should inform their doctor if they wish to become pregnant or think they might be pregnant. There is a potential for serious side effects to an unborn child. Talk to your health  care professional or pharmacist for more information. Do not breast-feed an infant while taking this medicine orfor 4 months after the last dose. What side effects may I notice from receiving this medication? Side effects that you should report to your doctor or health care professionalas soon as possible: allergic reactions like skin rash, itching or hives, swelling of the face, lips, or tongue bloody or black, tarry breathing problems changes in vision chest pain chills confusion constipation cough diarrhea dizziness or feeling faint or lightheaded fast or irregular heartbeat fever flushing joint pain low blood counts - this medicine may decrease the number of white blood cells, red blood cells and platelets. You may be at increased risk for infections and bleeding. muscle pain muscle weakness pain, tingling, numbness in the hands or feet persistent headache redness, blistering, peeling or loosening of the skin, including inside the mouth signs and symptoms of high blood sugar such as dizziness; dry mouth; dry skin; fruity breath; nausea; stomach pain; increased hunger or thirst; increased urination signs and symptoms of kidney injury like trouble passing urine or change in the amount of urine signs and symptoms of liver injury like dark urine, light-colored stools, loss of appetite, nausea, right upper belly pain, yellowing of the eyes or skin sweating swollen lymph nodes weight loss Side effects that usually do not require medical attention (report to yourdoctor or health care  professional if they continue or are bothersome): decreased appetite hair loss tiredness This list may not describe all possible side effects. Call your doctor for medical advice about side effects. You may report side effects to FDA at1-800-FDA-1088. Where should I keep my medication? This drug is given in a hospital or clinic and will not be stored at home. NOTE: This sheet is a summary. It may not  cover all possible information. If you have questions about this medicine, talk to your doctor, pharmacist, orhealth care provider.  2022 Elsevier/Gold Standard (2018-11-30 21:44:53)  Cyclophosphamide Injection What is this medication? CYCLOPHOSPHAMIDE (sye kloe FOSS fa mide) is a chemotherapy drug. It slows the growth of cancer cells. This medicine is used to treat many types of cancer like lymphoma, myeloma, leukemia, breast cancer, and ovarian cancer, to name afew. This medicine may be used for other purposes; ask your health care provider orpharmacist if you have questions. COMMON BRAND NAME(S): Cytoxan, Neosar What should I tell my care team before I take this medication? They need to know if you have any of these conditions: heart disease history of irregular heartbeat infection kidney disease liver disease low blood counts, like white cells, platelets, or red blood cells on hemodialysis recent or ongoing radiation therapy scarring or thickening of the lungs trouble passing urine an unusual or allergic reaction to cyclophosphamide, other medicines, foods, dyes, or preservatives pregnant or trying to get pregnant breast-feeding How should I use this medication? This drug is usually given as an injection into a vein or muscle or by infusion into a vein. It is administered in a hospital or clinic by a specially trainedhealth care professional. Talk to your pediatrician regarding the use of this medicine in children.Special care may be needed. Overdosage: If you think you have taken too much of this medicine contact apoison control center or emergency room at once. NOTE: This medicine is only for you. Do not share this medicine with others. What if I miss a dose? It is important not to miss your dose. Call your doctor or health careprofessional if you are unable to keep an appointment. What may interact with this medication? amphotericin B azathioprine certain antivirals for HIV  or hepatitis certain medicines for blood pressure, heart disease, irregular heart beat certain medicines that treat or prevent blood clots like warfarin certain other medicines for cancer cyclosporine etanercept indomethacin medicines that relax muscles for surgery medicines to increase blood counts metronidazole This list may not describe all possible interactions. Give your health care provider a list of all the medicines, herbs, non-prescription drugs, or dietary supplements you use. Also tell them if you smoke, drink alcohol, or use illegaldrugs. Some items may interact with your medicine. What should I watch for while using this medication? Your condition will be monitored carefully while you are receiving thismedicine. You may need blood work done while you are taking this medicine. Drink water or other fluids as directed. Urinate often, even at night. Some products may contain alcohol. Ask your health care professional if this medicine contains alcohol. Be sure to tell all health care professionals you are taking this medicine. Certain medicines, like metronidazole and disulfiram, can cause an unpleasant reaction when taken with alcohol. The reaction includes flushing, headache, nausea, vomiting, sweating, and increased thirst. Thereaction can last from 30 minutes to several hours. Do not become pregnant while taking this medicine or for 1 year after stopping it. Women should inform their health care professional if they wish to become pregnant or  think they might be pregnant. Men should not father a child while taking this medicine and for 4 months after stopping it. There is potential for serious side effects to an unborn child. Talk to your health care professionalfor more information. Do not breast-feed an infant while taking this medicine or for 1 week afterstopping it. This medicine has caused ovarian failure in some women. This medicine may make it more difficult to get pregnant. Talk  to your health care professional if [place] concerned about your fertility. This medicine has caused decreased sperm counts in some men. This may make it more difficult to father a child. Talk to your health care professional if [place] concerned about your fertility. Call your health care professional for advice if you get a fever, chills, or sore throat, or other symptoms of a cold or flu. Do not treat yourself. This medicine decreases your body's ability to fight infections. Try to avoid beingaround people who are sick. Avoid taking medicines that contain aspirin, acetaminophen, ibuprofen, naproxen, or ketoprofen unless instructed by your health care professional.These medicines may hide a fever. Talk to your health care professional about your risk of cancer. You may bemore at risk for certain types of cancer if you take this medicine. If you are going to need surgery or other procedure, tell your health careprofessional that you are using this medicine. Be careful brushing or flossing your teeth or using a toothpick because you may get an infection or bleed more easily. If you have any dental work done, Primary school teacher you are receiving this medicine. What side effects may I notice from receiving this medication? Side effects that you should report to your doctor or health care professionalas soon as possible: allergic reactions like skin rash, itching or hives, swelling of the face, lips, or tongue breathing problems nausea, vomiting signs and symptoms of bleeding such as bloody or black, tarry stools; red or dark brown urine; spitting up blood or brown material that looks like coffee grounds; red spots on the skin; unusual bruising or bleeding from the eyes, gums, or nose signs and symptoms of heart failure like fast, irregular heartbeat, sudden weight gain; swelling of the ankles, feet, hands signs and symptoms of infection like fever; chills; cough; sore throat; pain or trouble passing  urine signs and symptoms of kidney injury like trouble passing urine or change in the amount of urine signs and symptoms of liver injury like dark yellow or brown urine; general ill feeling or flu-like symptoms; light-colored stools; loss of appetite; nausea; right upper belly pain; unusually weak or tired; yellowing of the eyes or skin Side effects that usually do not require medical attention (report to yourdoctor or health care professional if they continue or are bothersome): confusion decreased hearing diarrhea facial flushing hair loss headache loss of appetite missed menstrual periods signs and symptoms of low red blood cells or anemia such as unusually weak or tired; feeling faint or lightheaded; falls skin discoloration This list may not describe all possible side effects. Call your doctor for medical advice about side effects. You may report side effects to FDA at1-800-FDA-1088. Where should I keep my medication? This drug is given in a hospital or clinic and will not be stored at home. NOTE: This sheet is a summary. It may not cover all possible information. If you have questions about this medicine, talk to your doctor, pharmacist, orhealth care provider.  2022 Elsevier/Gold Standard (2018-10-03 09:53:29)  Methotrexate Injection What is this medication? METHOTREXATE (METH oh  TREX ate) treats inflammatory conditions such as arthritis and psoriasis. It works by decreasing inflammation, which can reduce pain and prevent long-term injury to the joints and skin. It may also be used to treat some types of cancer. It works by slowing down the growth of cancercells. This medicine may be used for other purposes; ask your health care provider orpharmacist if you have questions. What should I tell my care team before I take this medication? They need to know if you have any of these conditions: Fluid in the stomach area or lungs If you often drink alcohol Infection or immune system  problems Kidney disease Liver disease Low blood counts (white cells, platelets, or red blood cells) Lung disease Recent or ongoing radiation Recent or upcoming vaccine Stomach ulcers Ulcerative colitis An unusual or allergic reaction to methotrexate, other medications, foods, dyes, or preservatives Pregnant or trying to get pregnant Breast-feeding How should I use this medication? This medication is for infusion into a vein or for injection into muscle or into the spinal fluid (whichever applies). It is usually given in a hospital orclinic setting. In rare cases, you might get this medication at home. You will be taught how to give this medication. Use exactly as directed. Take your medication at regularintervals. Do not take your medication more often than directed. If this medication is used for arthritis or psoriasis, it should be takenweekly, NOT daily. It is important that you put your used needles and syringes in a special sharps container. Do not put them in a trash can. If you do not have a sharpscontainer, call your pharmacist or care team to get one. Talk to your care team about the use of this medication in children. While this medication may be prescribed for children as young as 2 years for selectedconditions, precautions do apply. Overdosage: If you think you have taken too much of this medicine contact apoison control center or emergency room at once. NOTE: This medicine is only for you. Do not share this medicine with others. What if I miss a dose? It is important not to miss your dose. Call your care team if you are unable to keep an appointment. If you give yourself the medication, and you miss a dose,talk with your care team. Do not take double or extra doses. What may interact with this medication? Do not take this medication with any of the following: Acitretin This medication may also interact with the following: Aspirin or aspirin-like medications including  salicylates Azathioprine Certain antibiotics like chloramphenicol, penicillin, tetracycline Certain medications that treat or prevent blood clots like warfarin, apixaban, dabigatran, and rivaroxaban Certain medications for stomach problems like esomeprazole, omeprazole, pantoprazole Cyclosporine Dapsone Diuretics Folic acid Gold Hydroxychloroquine Live virus vaccines Medications for infection like acyclovir, adefovir, amphotericin B, bacitracin, cidofovir, foscarnet, ganciclovir, gentamicin, pentamidine, vancomycin Mercaptopurine NSAIDs, medications for pain and inflammation, like ibuprofen or naproxen Pamidronate Pemetrexed Penicillamine Phenylbutazone Phenytoin Probenecid Pyrimethamine Retinoids such as isotretinoin and tretinoin Steroid medications like prednisone or cortisone Sulfonamides like sulfasalazine and trimethoprim/sulfamethoxazole Theophylline Zoledronic acid This list may not describe all possible interactions. Give your health care provider a list of all the medicines, herbs, non-prescription drugs, or dietary supplements you use. Also tell them if you smoke, drink alcohol, or use illegaldrugs. Some items may interact with your medicine. What should I watch for while using this medication? This medication may make you feel generally unwell. This is not uncommon as chemotherapy can affect healthy cells as well as cancer cells. Report any  side effects. Continue your course of treatment even though you feel ill unless yourcare team tells you to stop. Your condition will be monitored carefully while you are receiving thismedication. Avoid alcoholic drinks. This medication can cause serious side effects. To reduce the risk, your care team may give you other medications to take before receiving this one. Be sureto follow the directions from your care team. This medication can make you more sensitive to the sun. Keep out of the sun. If you cannot avoid being in the sun, wear  protective clothing and use sunscreen.Do not use sun lamps or tanning beds/booths. You may get drowsy or dizzy. Do not drive, use machinery, or do anything that needs mental alertness until you know how this medication affects you. Do not stand or sit up quickly, especially if you are an older patient. This reducesthe risk of dizzy or fainting spells. You may need blood work while you are taking this medication. Call your care team for advice if you get a fever, chills or sore throat, or other symptoms of a cold or flu. Do not treat yourself. This medication decreases your body's ability to fight infections. Try to avoid being aroundpeople who are sick. This medication may increase your risk to bruise or bleed. Call your care teamif you notice any unusual bleeding. Be careful brushing or flossing your teeth or using a toothpick because you may get an infection or bleed more easily. If you have any dental work done, Primary school teacher you are receiving this medication Check with your care team if you get an attack of severe diarrhea, nausea and vomiting, or if you sweat a lot. The loss of too much body fluid can make itdangerous for you to take this medication. Talk to your care team about your risk of cancer. You may be more at risk forcertain types of cancers if you take this medication. Do not become pregnant while taking this medication or for 6 months after stopping it. Women should inform their care team if they wish to become pregnant or think they might be pregnant. Men should not father a child while taking this medication and for 3 months after stopping it. There is potential for serious harm to an unborn child. Talk to your care team for more information. Do not breast-feed an infant while taking this medication or for 1week after stopping it. This medication may make it more difficult to get pregnant or father a child.Talk to your care team if you are concerned about your fertility. What side  effects may I notice from receiving this medication? Side effects that you should report to your care team as soon as possible: Allergic reactions-skin rash, itching, hives, swelling of the face, lips, tongue, or throat Blood clot-pain, swelling, or warmth in the leg, shortness of breath, chest pain Dry cough, shortness of breath or trouble breathing Infection-fever, chills, cough, sore throat, wounds that don't heal, pain or trouble when passing urine, general feeling of discomfort or being unwell Kidney injury-decrease in the amount of urine, swelling of the ankles, hands, or feet Liver injury-right upper belly pain, loss of appetite, nausea, light-colored stool, dark yellow or brown urine, yellowing of the skin or eyes, unusual weakness or fatigue Low red blood cell count-unusual weakness or fatigue, dizziness, headache, trouble breathing Redness, blistering, peeling, or loosening of the skin, including inside the mouth Seizures Unusual bruising or bleeding Side effects that usually do not require medical attention (report to your careteam if they continue or are  bothersome): Diarrhea Dizziness Hair loss Nausea Pain, redness, or swelling with sores inside the mouth or throat Vomiting This list may not describe all possible side effects. Call your doctor for medical advice about side effects. You may report side effects to FDA at1-800-FDA-1088. Where should I keep my medication? This medication is given in a hospital or clinic. It will not be stored at home. NOTE: This sheet is a summary. It may not cover all possible information. If you have questions about this medicine, talk to your doctor, pharmacist, orhealth care provider.  2022 Elsevier/Gold Standard (2020-02-05 12:51:29)  Fluorouracil, 5-FU injection What is this medication? FLUOROURACIL, 5-FU (flure oh YOOR a sil) is a chemotherapy drug. It slows the growth of cancer cells. This medicine is used to treat many types of cancer  like breast cancer, colon or rectal cancer, pancreatic cancer, and stomachcancer. This medicine may be used for other purposes; ask your health care provider orpharmacist if you have questions. COMMON BRAND NAME(S): Adrucil What should I tell my care team before I take this medication? They need to know if you have any of these conditions: blood disorders dihydropyrimidine dehydrogenase (DPD) deficiency infection (especially a virus infection such as chickenpox, cold sores, or herpes) kidney disease liver disease malnourished, poor nutrition recent or ongoing radiation therapy an unusual or allergic reaction to fluorouracil, other chemotherapy, other medicines, foods, dyes, or preservatives pregnant or trying to get pregnant breast-feeding How should I use this medication? This drug is given as an infusion or injection into a vein. It is administeredin a hospital or clinic by a specially trained health care professional. Talk to your pediatrician regarding the use of this medicine in children.Special care may be needed. Overdosage: If you think you have taken too much of this medicine contact [facility] control center or emergency room at once. NOTE: This medicine is only for you. Do not share this medicine with others. What if I miss a dose? It is important not to miss your dose. Call your doctor or health careprofessional if you are unable to keep an appointment. What may interact with this medication? Do not take this medicine with any of the following medications: live virus vaccines This medicine may also interact with the following medications: medicines that treat or prevent blood clots like warfarin, enoxaparin, and dalteparin This list may not describe all possible interactions. Give your health care provider a list of all the medicines, herbs, non-prescription drugs, or dietary supplements you use. Also tell them if you smoke, drink alcohol, or use illegaldrugs. Some items may  interact with your medicine. What should I watch for while using this medication? Visit your doctor for checks on your progress. This drug may make you feel generally unwell. This is not uncommon, as chemotherapy can affect healthy cells as well as cancer cells. Report any side effects. Continue your course oftreatment even though you feel ill unless your doctor tells you to stop. In some cases, you may be given additional medicines to help with side effects.Follow all directions for their use. Call your doctor or health care professional for advice if you get a fever, chills or sore throat, or other symptoms of a cold or flu. Do not treat yourself. This drug decreases your body's ability to fight infections. Try toavoid being around people who are sick. This medicine may increase your risk to bruise or bleed. Call your doctor orhealth care professional if you notice any unusual bleeding. Be careful brushing and flossing your teeth or using  a toothpick because you may get an infection or bleed more easily. If you have any dental work done,tell your dentist you are receiving this medicine. Avoid taking products that contain aspirin, acetaminophen, ibuprofen, naproxen, or ketoprofen unless instructed by your doctor. These medicines may hide afever. Do not become pregnant while taking this medicine. Women should inform their doctor if they wish to become pregnant or think they might be pregnant. There is a potential for serious side effects to an unborn child. Talk to your health care professional or pharmacist for more information. Do not breast-feed aninfant while taking this medicine. Men should inform their doctor if they wish to father a child. This medicinemay lower sperm counts. Do not treat diarrhea with over the counter products. Contact your doctor ifyou have diarrhea that lasts more than 2 days or if it is severe and watery. This medicine can make you more sensitive to the sun. Keep out of the  sun. If you cannot avoid being in the sun, wear protective clothing and use sunscreen.Do not use sun lamps or tanning beds/booths. What side effects may I notice from receiving this medication? Side effects that you should report to your doctor or health care professionalas soon as possible: allergic reactions like skin rash, itching or hives, swelling of the face, lips, or tongue low blood counts - this medicine may decrease the number of white blood cells, red blood cells and platelets. You may be at increased risk for infections and bleeding. signs of infection - fever or chills, cough, sore throat, pain or difficulty passing urine signs of decreased platelets or bleeding - bruising, pinpoint red spots on the skin, black, tarry stools, blood in the urine signs of decreased red blood cells - unusually weak or tired, fainting spells, lightheadedness breathing problems changes in vision chest pain mouth sores nausea and vomiting pain, swelling, redness at site where injected pain, tingling, numbness in the hands or feet redness, swelling, or sores on hands or feet stomach pain unusual bleeding Side effects that usually do not require medical attention (report to yourdoctor or health care professional if they continue or are bothersome): changes in finger or toe nails diarrhea dry or itchy skin hair loss headache loss of appetite sensitivity of eyes to the light stomach upset unusually teary eyes This list may not describe all possible side effects. Call your doctor for medical advice about side effects. You may report side effects to FDA at1-800-FDA-1088. Where should I keep my medication? This drug is given in a hospital or clinic and will not be stored at home. NOTE: This sheet is a summary. It may not cover all possible information. If you have questions about this medicine, talk to your doctor, pharmacist, orhealth care provider.  2022 [employer] (2018-11-29  15:00:03)

## 2020-08-03 ENCOUNTER — Encounter: Payer: Self-pay | Admitting: Hematology

## 2020-08-07 ENCOUNTER — Other Ambulatory Visit (HOSPITAL_COMMUNITY): Payer: Self-pay

## 2020-08-09 ENCOUNTER — Ambulatory Visit: Payer: Medicaid Other

## 2020-08-09 ENCOUNTER — Other Ambulatory Visit: Payer: Medicaid Other

## 2020-08-09 ENCOUNTER — Ambulatory Visit: Payer: Medicaid Other | Admitting: Hematology

## 2020-08-09 ENCOUNTER — Other Ambulatory Visit (HOSPITAL_COMMUNITY): Payer: Self-pay

## 2020-08-16 ENCOUNTER — Other Ambulatory Visit: Payer: Medicaid Other

## 2020-08-16 ENCOUNTER — Ambulatory Visit: Payer: Medicaid Other

## 2020-08-16 ENCOUNTER — Ambulatory Visit: Payer: Medicaid Other | Admitting: Hematology

## 2020-08-22 ENCOUNTER — Other Ambulatory Visit: Payer: Self-pay

## 2020-08-22 DIAGNOSIS — C7951 Secondary malignant neoplasm of bone: Secondary | ICD-10-CM

## 2020-08-22 DIAGNOSIS — C50919 Malignant neoplasm of unspecified site of unspecified female breast: Secondary | ICD-10-CM

## 2020-08-22 MED ORDER — LIDOCAINE-PRILOCAINE 2.5-2.5 % EX CREA: 1.0000 "application " | TOPICAL_CREAM | CUTANEOUS | 2 refills | Status: DC | PRN

## 2020-08-22 NOTE — Progress Notes (Signed)
[place]   [phone] [phone]   Clinic Follow up Note   Patient Care Team: [name], Debra Lesches, [name] as PCP - General (Physician Assistant) Debra Killings, MD as Consulting Physician (Orthopedic Surgery)  Date of Service:  08/23/2020  CHIEF COMPLAINT: f/u of metastatic breast cancer  SUMMARY OF ONCOLOGIC HISTORY: Oncology History Overview Note   Cancer Staging No matching staging information was found for the patient.     [name], right ([place])  11/1997 Cancer Diagnosis   Patient had 1.4 cm poorly differentiated right breast carcinoma diagnosed in Nov 1999 at age 55, 1/7 axillary nodes involved, ER/PR and HER 2 positive. She had mastectomy with the 7 axillary node evaluation, 4 cycles of adriamycin/ cytoxan followed by taxotere, then five years of tamoxifen thru June 2005 (no herceptin in 1999). She had bilateral oophorectomy in May 2005. She was briefly on aromatase inhibitor after tamoxifen, but discontinued this herself due to poor tolerance.   04/04/2015 Genetic Testing   Negative genetic testing on the breast/ovarian cancer pnael.  The Breast/Ovarian gene panel offered by [facility] includes sequencing and rearrangement analysis for the following 20 genes:  ATM, BARD1, BRCA1, BRCA2, BRIP1, CDH1, CHEK2, EPCAM, FANCC, MLH1, MSH2, MSH6, NBN, PALB2, PMS2, PTEN, RAD51C, RAD51D, TP53, and XRCC2.   06/26/2015 Pathology Results   Initial Path Report Bone, curettage, Left femur met, intramedullary subtroch tissue - METASTATIC ADENOCARCINOMA.  Estrogen Receptor: 95%, POSITIVE, STRONG STAINING INTENSITY Progesterone Receptor: 2%, POSITIVE, STRONG  HER2 (+), IHC 3+   06/26/2015 Surgery   Biopsy of metastatic tissue and stabilization with Affixus trochanteric nail, proximal and distal interlock of left femur by Dr. [name]    06/28/2015 Imaging   CT Chest w/ Contrast 1. Extensive right internal mammary chain  lymphadenopathy consistent with metastatic breast cancer. 2. Lytic metastases involving the posterior elements at T1 with probable nondisplaced pathologic fracture of the spinous process. 3. No other evidence of thoracic metastatic disease. 4. Trace bilateral pleural effusions with associated bibasilar atelectasis. 5. Indeterminate right thyroid nodule, not previously imaged. This could be evaluated with thyroid ultrasound as clinically warranted.   06/29/2015 -  Chemotherapy   Zometa started monthly on 06/29/15 and switched to q76months from 01/08/17   07/01/2015 Imaging   Bone Scan 1. Increased activity noted throughout the left femur. Although metastatic disease cannot be excluded, these changes are most likely secondary to prior surgery. 2. No other focal abnormalities identified to suggest metastatic disease.   07/12/2015 - 07/26/2015 Radiation Therapy   Left femur, 30 Gy in 10 fractions by Dr. Sondra Come    07/14/2015 Initial Diagnosis   [name], right ([place])   07/19/2015 Imaging   Initial PET Scan 1. Severe multifocal osseous metastatic disease, much greater than anticipated based on prior imaging. 2. Multifocal nodal metastases involving the right internal mammary, prevascular and subpectoral lymph nodes. No axillary pulmonary involvement identified. 3. No distant extra osseous metastases.   07/30/2015 - 11/29/2015 Chemotherapy   Docetaxel, Herceptin and Perjeta every 3 weeks X6 cycles, pt tolerated moderately well, restaging scan showed excellent response    09/18/2015 Imaging   CT Head w/wo Contrast 1. No acute intracranial abnormality or significant interval change. 2. Stable minimal periventricular white matter hypoattenuation on the right. This may reflect a remote ischemic injury. 3. No evidence for metastatic disease to the brain. 4. No focal soft tissue lesion to explain the patient's tenderness.   10/30/2015 Imaging  CT Abdomen Pelvis w/  Contrast 1. Few mildly prominent fluid-filled loops of small bowel scattered throughout the abdomen, with intraluminal fluid density within the distal colon. Given the provided history, findings are suggestive of acute enteritis/diarrheal illness. 2. No other acute intra-abdominal or pelvic process identified. 3. Widespread osseous metastatic disease, better evaluated on most recent PET-CT from 07/19/2015. No pathologic fracture or other complication. 4. 11 mm hypodensity within the left kidney. This lesion measures intermediate density, and is indeterminate. While this lesion is similar in size relative to recent studies, this is increased in size relative to prior study from 2012. Further evaluation with dedicated renal mass protocol CT and/or MRI is recommended for complete characterization.   12/2015 - 05/14/2016 Chemotherapy   Maintenance Herceptin and pejeta every 3 weeks stopped on 05/14/16 due to disease progression   12/25/2015 Imaging   Restaging PET Scan 1. Markedly improved skeletal activity, with only several faint foci of residual accentuated metabolic activity, but resolution of the vast majority of the previously extensive osseous metastatic disease. 2. Resolution of the prior right internal mammary and right prevascular lymph nodes. 3. Residual subcutaneous edema and edema along fascia planes in the left upper thigh. This remains somewhat more than I would expect for placement of an IM nail 6 weeks ago. If there is leg swelling further down, consider Doppler venous ultrasound to rule out left lower extremity DVT. I do not perceive an obvious difference in density between the pelvic and common femoral veins on the noncontrast CT data. 4. Chronic right maxillary and right sphenoid sinusitis.   01/16/2016 - 10/20/2017 Anti-estrogen oral therapy   Letrozole 2.5 mg daily. She was switched to exemestane due to disease progression on 02/08/17. Stopped on 10/20/2017 when treatment changed to chemo     03/01/2016 Miscellaneous   Patient presented to ED following a fall; she reports she landed on her back and is now experiencing back pain. The patient was evaluated and discharge home same day with pain control.   05/01/2016 Imaging   CT CAP IMPRESSION: 1. Stable exam.  No new or progressive disease identified. 2. No evidence for residual or recurrent adenopathy within the chest. 3. Stable bone metastasis.   05/01/2016 Imaging   BONE SCAN IMPRESSION: 1. Small focal uptake involving the anterior rib ends of the left second and right fourth ribs, new since the prior bone scan. The location of this uptake is more suggestive of a traumatic or inflammatory etiology as opposed to metastatic disease. No other evidence of metastatic disease. 2. There are other areas of stable uptake which are most likely degenerative/reactive, including right shoulder and cervical spine uptake and uptake along the right femur adjacent to the ORIF hardware.   05/20/2016 Imaging   NM PET Skull to Thigh IMPRESSION: 1. There multiple metastatic foci in the bony pelvis, including some new abnormal foci compare to the prior PET-CT, compatible with mildly progressive bony metastatic disease. Also, a left T11 vertebral hypermetabolic metastatic lesion is observed, new compared to the prior exam. 2. No active extraosseous malignancy is currently identified. 3. Right anterior fourth rib healing fracture, appears be   06/04/2016 - 01/08/2017 Chemotherapy   Second line chemotherapy Kadcyla every 3 weeks started on 06/04/16 and stopped on 01/08/17 due to mixed response.     10/05/2016 Imaging   CT CAP and Bone Scan:  Bones: left intramedullary rod and left femoral head neck screw in place. In the proximal diaphysis of the left femur there is cortical thickening and   irregularity. No lytic or blastic bone lesions. No fracture or vertebral endplate destruction.   No definite evidence of recurrent disease in the  chest, abdomen, or pelvis.   10/28/2016 Imaging   CT A/P IMPRESSION: No acute process demonstrated in the abdomen or pelvis. No evidence of bowel obstruction or inflammation.   11/04/2016 Imaging   DG foot complete left: IMPRESSION: No fracture or dislocation.  No soft tissue abnormality   11/23/2016 Imaging   CT RIGHT FEMUR IMPRESSION: Negative CT scan of the right thigh.  No visible metastatic disease.  CT LEFT FEMUR IMPRESSION: 1. Postsurgical changes related to prior cephalomedullary rod fixation of the left femur with linear lucency along the anterolateral proximal femoral cortex at level of the lesser trochanter, suspicious for nondisplaced fracture. 2. Slightly permeative appearance of the proximal left femoral cortex at the site of known prior osseous metastases is largely unchanged. 3. Unchanged patchy lucency and sclerosis along the anterior acetabulum, which appears to correspond with an area of faint uptake on the prior PET-CT, suspicious for osseous metastasis. These results will be called to the ordering clinician or representative by the Radiologist Assistant, and communication documented in the PACS or zVision Dashboard.       01/27/2017 Imaging   IMPRESSION: 1. Mixed response to osseous metastasis. Some hypermetabolic lesions are new and increasingly hypermetabolic, while another index lesion is decreasingly hypermetabolic. 2. No evidence of hypermetabolic soft tissue metastasis.     02/08/2017 - 10/19/2017 Antibody Plan   -Herceptin every 3 week since 02/08/17 -Oral Lanpantinib $RemoveBeforeDEI'1500mg'lrcgsAABgGqeoBIN$  and decreased to $RemoveBefo'500mg'sObIMhDdcdn$  due to diarrhea and depression. Due to disease progression she swithced to Verzenio $RemoveBef'150mg'ETPFckRfpY$  on 05/04/17. She did not toelrate well so she was switched to Ibrance $RemoveBe'100mg'KRKtpGfFG$  on 05/31/17. Due to her neutropenia, Ibrance dose reduced to 75 mg daily, 3 weeks on, one-week off, stopped on 10/20/2017 due to disease progression.    04/30/2017 PET scan   IMPRESSION: 1.  Primarily increase in hypermetabolism of osseous metastasis. An isolated right acetabular lesion is decreased in hypermetabolism since the prior exam. 2. No evidence of hypermetabolic soft tissue metastasis. 3. Similar non FDG avid right-sided thyroid nodule, favoring a benign etiology. Recommend attention on follow-up.   08/30/2017 PET scan   08/30/2017 PET Scan  IMPRESSION: 1. Worsening osseous metastatic disease. 2. Focal hypermetabolism in the central spinal canal the L1 level, similar to the prior exam. Metastatic implant cannot be excluded. 3. Slight enlargement of a right thyroid nodule which now appears more cystic in character. Consider further evaluation with thyroid ultrasound. If patient is clinically hyperthyroid, consider nuclear medicine thyroid uptake and scan   08/30/2017 Progression   08/30/2017 PET Scan  IMPRESSION: 1. Worsening osseous metastatic disease. 2. Focal hypermetabolism in the central spinal canal the L1 level, similar to the prior exam. Metastatic implant cannot be excluded. 3. Slight enlargement of a right thyroid nodule which now appears more cystic in character. Consider further evaluation with thyroid ultrasound. If patient is clinically hyperthyroid, consider nuclear medicine thyroid uptake and scan   09/29/2017 - 10/11/2017 Radiation Therapy   Pt received 27 Gy in 9 fractions directed at the areas of significant uptake noted on recent bone scan the right pelvis and right proximal femur, with Dr, Sondra Come    10/20/2017 - 04/07/2018 Chemotherapy   -Herceptin every 3 weeks starting 02/08/17, perjeta added on 10/20/2017  -weekly Taxol started on 10/21/2017. Will reduce Taxol to 2 weeks on, 1 week off starting on 02/04/17.  Stoped due to mild disease  progression   10/27/2017 Imaging   10/27/2017 CT AP IMPRESSION: 1. No acute abnormality. No evidence of bowel obstruction or acute bowel inflammation. Normal appendix. 2. Stable patchy sclerotic osseous  metastases throughout the visualized skeleton. No new or progressive metastatic disease in the abdomen or pelvis.   01/11/2018 Imaging   Whole Body Bone Scan 01/11/18  IMPRESSION: 1. Multifocal osseous metastases as described. 2. Right scapular metastasis. 3. Right superior thoracic spinous process. 4. Right sacral ala and right L5 metastases. 5. Right acetabular metastasis. 6. Right proximal femur metastasis. 7. Diffuse metastases throughout the left femur. 8. Anterior right fourth rib metastasis.   01/11/2018 Imaging   CT CAP W Contrast 01/11/18  IMPRESSION: 1. Similar-appearing osseous metastatic disease. 2. No evidence for progressed metastatic disease in the chest, abdomen or pelvis.   03/30/2018 Imaging   Bone Scan 03/30/18  IMPRESSION: Stable multifocal bony metastatic disease.   03/30/2018 Imaging    CT CAP W Contrast 03/30/18 IMPRESSION: 1. New mild contiguous wall thickening with associated mucosal hyperenhancement throughout the left colon and rectum, suggesting nonspecific infectious or inflammatory proctocolitis. Differential includes C diff colitis. 2. Stable faint patchy sclerosis in the iliac bones and lumbar vertebral bodies. No new or progressive osseous metastatic disease by CT. Please note that the osseous lesions are relatively occult by CT and would be better evaluated by PET-CT, as clinically warranted. 3. No evidence of extra osseous metastatic disease.   These results will be called to the ordering clinician or representative by the Radiologist Assistant, and communication documented in the PACS or zVision Dashboard.   04/15/2018 - 06/16/2018 Chemotherapy   Enhertu q3weeks starting 04/15/18. Stopped Enhertu after cycle 3 on 05/27/18 due to poor toleration due to poor toleration with anorexia, weight loss, N&V, diarrhea   04/21/2018 PET scan   PET 04/21/18  IMPRESSION: 1. Overall marked interval improvement in bony hypermetabolic disease. All of the  previously identified hypermetabolic osseous metastases have decreased in hypermetabolism in the interval. There is a new tiny focus of hypermetabolism in the right aspect of the L3 vertebral body that appears to correspond to a new 8 mm lucency, raising concern for new metastatic disease. Close attention on follow-up recommended. 2. Focal hypermetabolism in the central spinal canal at the level of L1 previously has resolved. 3. No hypermetabolic soft tissue disease on today's study. 4. Right thyroid nodule seen previously has decreased in the interval.     06/16/2018 Imaging   CT LUMBAR SPINE WO CONTRAST 06/16/18 IMPRESSION: 1. Transitional lumbosacral anatomy. Correlation with radiographs is recommended prior to any operative intervention. 2. Suspicion of a right L3 vertebral body metastasis on the April PET-CT is not evident by CT. Stable degenerative versus metastatic lesion at the right L5 superior endplate. Small but increased right iliac bone metastasis since March. Lumbar MRI would best characterize lumbar metastatic disease (without contrast if necessary). 3. Suspect L2-L3 left subarticular segment disc herniation. Query left L2 radiculitis. 4. Stable appearance of rightward L4-L5 disc degeneration which could affect the right foramen and right lateral recess.   06/17/2018 - 11/16/2018 Chemotherapy   Restart Taxol 2 weeks on/1 week off with Herceptin and Perjeta q3weeks on 06/17/18. Perjeta was stopped after C5 due to diarrhea. Stopped Taxol after 11/16/18 due to disease progression to L3.    07/06/2018 PET scan   PET  IMPRESSION: 1. Near total resolution of hypermetabolic activity associated with skeletal metastasis. Very mild activity associated with the LEFT iliac bone which is similar to background blood  pool activity. 2. No new or progressive disease.   11/21/2018 PET scan   IMPRESSION: 1. New focal hypermetabolic lesion eccentric to the right in the L3 vertebral body,  maximum SUV 6.1 compatible with a new focus of active malignancy. 2. Very low-grade activity similar to blood pool in previous existing mildly sclerotic metastatic lesions including the left iliac bone lesion. Some of the mildly sclerotic lesions are not hypermetabolic compatible with effectively treated bony metastatic disease. 3. No hypermetabolic soft tissue lesions are identified.     11/30/2018 - 08/25/2019 Chemotherapy   Continue Herceptin q3weeks. Stopped 08/25/19 due to significant disease progression and switched to  IV Matuzumab   12/12/2018 - 12/17/2018 Chemotherapy   Tucatinib (TUKYSA) 6 tabs BID as a next line therapy starting 12/12/18. Stopped 12/17/18 duet o N&V, poor appetite and HA    12/21/2018 - 03/17/2019 Chemotherapy   IV Eribulin on day 1, 8 every 21 days starting 12/21/18. Stopped 03/17/19 due to mixed response to treatment.    03/21/2019 PET scan   IMPRESSION: 1. Mixed response to therapy of osseous metastasis. Although a right-sided L4 lesion demonstrates decreased hypermetabolism today, new left iliac and right ischial hypermetabolic lesions are seen. 2. No evidence of hypermetabolic soft tissue metastasis.     03/27/2019 - 07/21/2019 Chemotherapy   Neratinib (Nerlynx) titrate up from $Remov'120mg'fdLajz$  to $R'240mg'UQ$  starting 03/27/19. She could not tolerate $RemoveBefo'160mg'mIAaapGxbhu$  and stopped on 02/05/19 due to N&V and diarrhea. Will restart at $RemoveBe'120mg'GMzFpTdID$  on 04/10/18-04/27/19 and 4/19-4/22. Reduced to $RemoveBe'80mg'eVDtdMBCw$  once daily on 05/10/19. Held/stopped since 07/21/19 due to dry cough, Nausea and body aches.   06/23/2019 Imaging   CT Head  IMPRESSION: 1. No acute intracranial abnormality or metastatic disease identified. 2. Chronic white matter disease in the right hemisphere is stable from prior exams.     09/05/2019 PET scan   IMPRESSION: 1. Significant progression of widespread hypermetabolic osseous metastases throughout the axial skeleton as detailed. 2. No hypermetabolic extraosseous sites of metastatic disease.    09/15/2019 - 12/22/2019 Chemotherapy   -IV Margetuximab q3weeks and IV Gemcitabine 2 weeks on/1 week off starting 09/15/19. Carboplatin added to Gemcitabine starting with C3 on 11/03/19. D/c after C5 due to disease progression in bone.    12/13/2019 Imaging   PET  IMPRESSION: 1. Interval progression of widespread, FDG avid osseous metastasis involving the axial skeleton. 2. No FDG avid extra osseous sites of metastatic disease.     01/19/2020 -  Chemotherapy   -Xeloda BID 2 weeks on/1 week off and Herceptin q3weeks starting 01/19/20. C1 at $Rem'1000mg'JISt$  BID. C2 increased to $RemoveBefo'1500mg'bbwfEYYDApb$  in the AM and $Remo'1000mg'yDubL$  in the PM starting 02/12/20.   04/09/2020 Imaging   CT CAP  IMPRESSION: 1. New 3 mm RIGHT upper lobe nodule, nonspecific, attention on follow-up. 2. What may represent an incomplete periprosthetic fracture about the cephalo medullary rod in the LEFT proximal femur. This linear band of incomplete bony fusion that is more pronounced than on previous CT imaging that was acquired in 2017 and may represent a stress type fracture about hardware. Correlate with any pain in this area. Sclerotic margins show chronic features but the linear band passing through the bone around the intramedullary rod is more extensive, involving approximately 270 degrees of the circumference of the femur, medial cortex remains intact. 3. Other findings of bony metastatic disease are similar to prior imaging. Sclerosis and some periosteal reaction about a nondisplaced pathologic fracture of the LEFT lateral seventh rib. 4. Small hypervascular focus in the RIGHT hepatic lobe,  indistinct but new compared to previous imaging. Perhaps this represents a transient phenomenon though is not imaged on the later phase to allow for confirmation. Liver MRI may be helpful for further evaluation, to exclude underlying metastatic lesion as clinically warranted. 5. Hepatic steatosis. 6. Aortic atherosclerosis.   Aortic Atherosclerosis  (ICD10-I70.0).     07/18/2020 Imaging   PET  IMPRESSION: No signs of visceral or nodal metastatic disease.   Increased number of hypermetabolic lesions with increased intensity of many of these areas and increased "size" of affected areas based on the regions of FDG uptake. While there are areas that do show diminished FDG uptake such as in the RIGHT scapula on balance the number and size of these areas has increased.   08/02/2020 -  Chemotherapy    Patient is on Treatment Plan: BREAST ADJUVANT CMF IV Q21D   Patient is on Antibody Plan: BREAST TRASTUZUMAB Q21D        CURRENT THERAPY:  CHEMO CMF and Herceptin every 3 weeks  INTERVAL HISTORY:  [name] is here for a follow up of metastatic breast cancer. She was last seen by me on 08/02/20. She presents to the clinic accompanied by an interpreter.  She tolerated first cycle chemo CMF poorly, she had recurrent N/V for first 2 weeks, had body aches, fatigue and low appetitee also had fever once last week and once last night, no chills or other new symptoms. Most of her symptoms have resolved now, no weight loss.    All other systems were reviewed with the patient and are negative.  MEDICAL HISTORY:  Past Medical History:  Diagnosis Date   Abnormal Pap smear    Anxiety    Breast cancer ([place])    Cancer ([place]) 1999   breast-s/p mastectomy, chemo, rad   CIN I (cervical intraepithelial neoplasia I) 2003   by colpo   Congenital deafness    Deaf    Depression    Port-A-Cath in place 2017   for chemotherapy   Radiation 07/12/15-07/26/15   left femur 30 Gy   S/P bilateral oophorectomy     SURGICAL HISTORY: Past Surgical History:  Procedure Laterality Date   ABDOMINAL HYSTERECTOMY     INTRAMEDULLARY (IM) NAIL INTERTROCHANTERIC Left 06/26/2015   Procedure: LEFT BIOMET LONG AFFIXS NAIL;  Surgeon: Debra Killings, MD;  Location: [facility];  Service: Orthopedics;  Laterality: Left;   IR GENERIC HISTORICAL  08/06/2015   IR US GUIDE  VASC ACCESS LEFT 08/06/2015 Aletta Edouard, MD [facility]   IR GENERIC HISTORICAL  08/06/2015   IR FLUORO GUIDE CV LINE LEFT 08/06/2015 Aletta Edouard, MD [facility]   IR GENERIC HISTORICAL  03/05/2016   IR CV LINE INJECTION 03/05/2016 Markus Daft, MD [facility]   LEFT HEART CATH AND CORONARY ANGIOGRAPHY N/A 12/13/2017   Procedure: LEFT HEART CATH AND CORONARY ANGIOGRAPHY;  Surgeon: Leonie Man, MD;  Location: [facility];  Service: Cardiovascular;  Laterality: N/A;   MASTECTOMY     right breast   MASTECTOMY     OVARIAN CYST REMOVAL     RADIOLOGY WITH ANESTHESIA Left 06/25/2015   Procedure: MRI OF LEFT HIP WITH OR WITHOUT CONTRAST;  Surgeon: Medication Radiologist, MD;  Location: [facility];  Service: Radiology;  Laterality: Left;  DR. MCINTYRE/MRI   TUBAL LIGATION      I have reviewed the social history and family history with the patient and they are unchanged from previous note.  ALLERGIES:  is allergic to promethazine  hcl, chlorhexidine, and diphenhydramine hcl.  MEDICATIONS:  Current Outpatient Medications  Medication Sig Dispense Refill   dexamethasone (DECADRON) 4 MG tablet Take 1 tablet (4 mg total) by mouth daily. For 3-5 days after chemo 30 tablet 0   LORazepam (ATIVAN) 0.5 MG tablet Take 1 tablet (0.5 mg total) by mouth every 8 (eight) hours as needed for anxiety (nausea). 20 tablet 0   ALPRAZolam (XANAX) 0.25 MG tablet Take 1 tablet (0.25 mg total) by mouth 2 (two) times daily as needed for anxiety. 30 tablet 0   diphenoxylate-atropine (LOMOTIL) 2.5-0.025 MG tablet Take 1-2 tablets by mouth 4 (four) times daily as needed for diarrhea or loose stools. 60 tablet 1   lidocaine-prilocaine (EMLA) cream Apply 1 application topically as needed. Apply to Porta-Cath 1-2 hours prior to access as directed. 90 g 2   ondansetron (ZOFRAN) 8 MG tablet Take 1 tablet (8 mg total) by mouth every 8 (eight) hours as needed for nausea or vomiting. 45 tablet 1   oxyCODONE (ROXICODONE) 5 MG  immediate release tablet Take 1 tablet (5 mg total) by mouth every 8 (eight) hours as needed for severe pain. Refill on 02/17/2020 90 tablet 0   potassium chloride (KLOR-CON) 10 MEQ tablet Take 1 tablet (10 mEq total) by mouth 3 (three) times daily. 90 tablet 2   prochlorperazine (COMPAZINE) 10 MG tablet Take 1 tablet (10 mg total) by mouth every 6 (six) hours as needed for nausea or vomiting. 60 tablet 1   zolpidem (AMBIEN) 10 MG tablet Take 1 tablet (10 mg total) by mouth at bedtime as needed for sleep. 30 tablet 0   Current Facility-Administered Medications  Medication Dose Route Frequency Provider Last Rate Last Admin   0.9 % NaCl with KCl 20 mEq/ L  infusion   Intravenous Continuous Truitt Merle, MD 500 mL/hr at 08/23/20 0853 New Bag at 08/23/20 0853   Facility-Administered Medications Ordered in Other Visits  Medication Dose Route Frequency Provider Last Rate Last Admin   acetaminophen (TYLENOL) tablet 650 mg  650 mg Oral Once Truitt Merle, MD       cyclophosphamide (CYTOXAN) 660 mg in sodium chloride 0.9 % 250 mL chemo infusion  400 mg/m2 (Treatment Plan Recorded) Intravenous Once Truitt Merle, MD       dexamethasone (DECADRON) 12 mg in sodium chloride 0.9 % 50 mL IVPB  12 mg Intravenous Once Truitt Merle, MD       fluorouracil (ADRUCIL) chemo injection 650 mg  400 mg/m2 (Treatment Plan Recorded) Intravenous Once Truitt Merle, MD       fosaprepitant (EMEND) 150 mg in sodium chloride 0.9 % 145 mL IVPB  150 mg Intravenous Once Truitt Merle, MD       heparin lock flush 100 unit/mL  500 Units Intracatheter Once PRN Truitt Merle, MD       loratadine (CLARITIN) tablet 10 mg  10 mg Oral Once Truitt Merle, MD       methotrexate (PF) chemo injection 50 mg  30 mg/m2 (Treatment Plan Recorded) Intravenous Once Truitt Merle, MD       palonosetron (ALOXI) injection 0.25 mg  0.25 mg Intravenous Once Truitt Merle, MD       pembrolizumab [facility]) 200 mg in sodium chloride 0.9 % 50 mL chemo infusion  200 mg Intravenous Once Truitt Merle,  MD       potassium chloride SA (K-DUR) CR tablet 40 mEq  40 mEq Oral Once Truitt Merle, MD       sodium chloride  0.9 % 1,000 mL with potassium chloride 10 mEq infusion   Intravenous Continuous Livesay, Tamala Julian, MD   Stopped at 09/10/15 1653   sodium chloride 0.9 % injection 10 mL  10 mL Intravenous PRN Livesay, Lennis P, MD       sodium chloride flush (NS) 0.9 % injection 10 mL  10 mL Intracatheter PRN Truitt Merle, MD       trastuzumab-dkst (OGIVRI) 357 mg in sodium chloride 0.9 % 250 mL chemo infusion  6 mg/kg (Treatment Plan Recorded) Intravenous Once Truitt Merle, MD        PHYSICAL EXAMINATION: ECOG PERFORMANCE STATUS: 1 - Symptomatic but completely ambulatory  Vitals:   08/23/20 0815  BP: 110/63  Pulse: 82  Resp: 18  Temp: 97.9 F (36.6 C)  SpO2: 100%   Wt Readings from Last 3 Encounters:  08/23/20 130 lb 3.2 oz (59.1 kg)  08/02/20 133 lb 11.2 oz (60.6 kg)  07/19/20 133 lb 12.8 oz (60.7 kg)     GENERAL:alert, no distress and comfortable SKIN: skin color, texture, turgor are normal, no rashes or significant lesions EYES: normal, Conjunctiva are pink and non-injected, sclera clear Musculoskeletal:no cyanosis of digits and no clubbing  NEURO: alert & oriented x 3 with fluent speech, no focal motor/sensory deficits  LABORATORY DATA:  I have reviewed the data as listed CBC Latest Ref Rng & Units 08/23/2020 08/02/2020 07/19/2020  WBC 4.0 - 10.5 K/uL 2.8(L) 4.0 3.9(L)  Hemoglobin 12.0 - 15.0 g/dL 9.3(L) 11.5(L) 10.9(L)  Hematocrit 36.0 - 46.0 % 27.3(L) 32.1(L) 31.9(L)  Platelets 150 - 400 K/uL 210 215 181     CMP Latest Ref Rng & Units 08/23/2020 08/02/2020 07/19/2020  Glucose 70 - 99 mg/dL 139(H) 129(H) 157(H)  BUN 6 - 20 mg/dL $Remove'10 13 9  'MRrgGJY$ Creatinine 0.44 - 1.00 mg/dL 1.23(H) 1.11(H) 1.03(H)  Sodium 135 - 145 mmol/L 140 140 142  Potassium 3.5 - 5.1 mmol/L 3.3(L) 3.0(L) 2.8(L)  Chloride 98 - 111 mmol/L 103 103 104  CO2 22 - 32 mmol/L $RemoveB'23 26 24  'oCPSjuVK$ Calcium 8.9 - 10.3 mg/dL 9.4 9.4 9.4   Total Protein 6.5 - 8.1 g/dL 7.0 7.2 6.9  Total Bilirubin 0.3 - 1.2 mg/dL 1.9(H) 2.1(H) 2.6(H)  Alkaline Phos 38 - 126 U/L 100 71 84  AST 15 - 41 U/L $Remo'16 26 15  'iLFUq$ ALT 0 - 44 U/L $Remo'15 17 13      'ITlQy$ RADIOGRAPHIC STUDIES: I have personally reviewed the radiological images as listed and agreed with the findings in the report. No results found.   ASSESSMENT & PLAN:  [name] is a 55 y.o. female with   1. 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 She had right mastectomy, 4 cycles of AC-T and 5 years of Tamoxifen/AI. 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  -After her first line of chemo treatment and initial maintenance therapy, she progressed or had poor toleration of multiple lines of treatment since then.  -Based on 12/13/19 PET, she progressed diffusely in the bone on 7th line therapy with anti-HER2 IV Margetuximab q3weeks with IV Chemo weekly Gemcitabine (Carboplatin added C3) 2 weeks on/1 week off after 5 cycles, 09/15/19-12/22/19.  -I previously discussed the longer she is on treatment the less treatment options and efficacy she will have. -I started her on Xeloda $RemoveB'1000mg'OmMSkaEO$  BID 2 weeks on/1 week off for first cycle on 01/19/20.  Continue Herceptin q3weeks. Increased to $RemoveBefo'1500mg'ibsOMHqJUUQ$  in the AM and $Remo'1000mg'pKOUd$  in the PM since C2. Given worsened hyperbilirubinemia, Xeloda was held 04/01/20-04/19/20 then reduced to $RemoveBe'1000mg'PMruZDcAo$  BID 1 week on/1 week off. She is tolerating well and liver function stable. -PET scan 07/18/20 showed: no visceral or nodal metastatic disease; increase in skeletal metastases.  -We switched her treatment to CMF and Keytruda on 08/02/20, and continue herceptin. -she tolerated first cycle chemo poorly, with recurrent nausea vomiting and anorexia.  I will add dexamethasone and IV Emend as premedication, and decreased her chemo by 20-30% with cycle 2 -Lab reviewed, adequate  for treatment, she has developed mild cytopenias, slightly elevated creatinine, adequate for treatment, will proceed to cycle 2 with dose reduction today  2. Goal of care discussion, DNR/DNI -We previously reviewed goal of care and I recommend DNR/DNI, she agreed again today (07/19/20). -I again encouraged her to start setting up her living will. Her husband [name] Purple Sage is automatically her HCPOA. She is considering changing her HCPOA to her daughter [name], whom she lives with.  -Though her care is palliative, our goal is to prolong her life.   3. Body pain, secondary to bone mets, Hypocalcemia, leg cramps   -She is being treated with Zometa q48months. -She will continue oral calcium and Vit D and Prilosec for Acid Reflux.  -Current pain includes, lower back, upper legs, left hip and right shoulder. PET 12/15/19 does indicate disease progression in her spine, pelvis and right scapula. She also has headaches occasionally.  -Her 03/20/20 CT CAP shows possible fracture around pin of left hip from prior surgery.  She was evaluated by orthopedic surgeon Dr. [name], who recommended conservative measurements -Her PET 07/18/20 showed worsening bone mets. -Her pain is stable, and she uses oxycodone maybe 1-2 times a day -Patient is currently taking potassium 2 tablets a day, I recommend increase to 3 tablets a day   4. Congenital deafness   5. 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 Mild, only in her feet. -Continue monitoring. Stable.    7. Mild hyperbilirubinemia -likely second to chemotherapy, fluctuates  -Worsened on 03/22/20 with total bilirubin 3.2. Increased again to 3.7 on 04/01/20. -There is subtle change in liver on her 04/09/20 CT CAP, but she declined MRI Liver for further evaluation. -Stable overall      PLAN:   -proceed with C2 CMF, Keytruda and Herceptin today with 20-30% chemo dose reduction  -IVF with NS 1L and Kcl  70meq today for hydration and hypokalemia  -lab and f/u next Tuesday for toxicity checkup -lab, flush, f/u and chemo in 3 weeks      No problem-specific Assessment & Plan notes found for this encounter.   No orders of the defined types were placed in this encounter.  All questions were answered. The patient knows to call the clinic with any problems, questions or concerns. No barriers to learning was detected. The total time spent in the appointment was 30 minutes.     Truitt Merle, MD 08/23/2020   I, Wilburn Mylar, am acting as scribe for Truitt Merle, MD.   I have reviewed the above documentation for accuracy and completeness, and I agree with the above.

## 2020-08-23 ENCOUNTER — Inpatient Hospital Stay: Payer: Medicaid Other | Attending: Hematology

## 2020-08-23 ENCOUNTER — Encounter: Payer: Self-pay | Admitting: Hematology

## 2020-08-23 ENCOUNTER — Inpatient Hospital Stay: Payer: Medicaid Other

## 2020-08-23 ENCOUNTER — Inpatient Hospital Stay (HOSPITAL_BASED_OUTPATIENT_CLINIC_OR_DEPARTMENT_OTHER): Payer: Medicaid Other | Admitting: Hematology

## 2020-08-23 ENCOUNTER — Other Ambulatory Visit: Payer: Self-pay

## 2020-08-23 VITALS — BP 110/63 | HR 82 | Temp 97.9°F | Resp 18 | Wt 130.2 lb

## 2020-08-23 DIAGNOSIS — Z170 Estrogen receptor positive status [ER+]: Secondary | ICD-10-CM | POA: Diagnosis not present

## 2020-08-23 DIAGNOSIS — E876 Hypokalemia: Secondary | ICD-10-CM | POA: Insufficient documentation

## 2020-08-23 DIAGNOSIS — C7951 Secondary malignant neoplasm of bone: Secondary | ICD-10-CM | POA: Insufficient documentation

## 2020-08-23 DIAGNOSIS — C50911 Malignant neoplasm of unspecified site of right female breast: Secondary | ICD-10-CM | POA: Diagnosis not present

## 2020-08-23 DIAGNOSIS — D6481 Anemia due to antineoplastic chemotherapy: Secondary | ICD-10-CM | POA: Insufficient documentation

## 2020-08-23 DIAGNOSIS — G4700 Insomnia, unspecified: Secondary | ICD-10-CM | POA: Diagnosis not present

## 2020-08-23 DIAGNOSIS — Z5111 Encounter for antineoplastic chemotherapy: Secondary | ICD-10-CM | POA: Diagnosis present

## 2020-08-23 DIAGNOSIS — Z5112 Encounter for antineoplastic immunotherapy: Secondary | ICD-10-CM | POA: Insufficient documentation

## 2020-08-23 DIAGNOSIS — Z95828 Presence of other vascular implants and grafts: Secondary | ICD-10-CM

## 2020-08-23 DIAGNOSIS — G620 Drug-induced polyneuropathy: Secondary | ICD-10-CM | POA: Diagnosis not present

## 2020-08-23 LAB — CBC WITH DIFFERENTIAL (CANCER CENTER ONLY)
Abs Immature Granulocytes: 0.05 10*3/uL (ref 0.00–0.07)
Basophils Absolute: 0 10*3/uL (ref 0.0–0.1)
Basophils Relative: 1 %
Eosinophils Absolute: 0.1 10*3/uL (ref 0.0–0.5)
Eosinophils Relative: 2 %
HCT: 27.3 % — ABNORMAL LOW (ref 36.0–46.0)
Hemoglobin: 9.3 g/dL — ABNORMAL LOW (ref 12.0–15.0)
Immature Granulocytes: 2 %
Lymphocytes Relative: 28 %
Lymphs Abs: 0.8 10*3/uL (ref 0.7–4.0)
MCH: 32.4 pg (ref 26.0–34.0)
MCHC: 34.1 g/dL (ref 30.0–36.0)
MCV: 95.1 fL (ref 80.0–100.0)
Monocytes Absolute: 0.4 10*3/uL (ref 0.1–1.0)
Monocytes Relative: 13 %
Neutro Abs: 1.6 10*3/uL — ABNORMAL LOW (ref 1.7–7.7)
Neutrophils Relative %: 54 %
Platelet Count: 210 10*3/uL (ref 150–400)
RBC: 2.87 MIL/uL — ABNORMAL LOW (ref 3.87–5.11)
RDW: 14.5 % (ref 11.5–15.5)
WBC Count: 2.8 10*3/uL — ABNORMAL LOW (ref 4.0–10.5)
nRBC: 0 % (ref 0.0–0.2)

## 2020-08-23 LAB — CMP (CANCER CENTER ONLY)
ALT: 15 U/L (ref 0–44)
AST: 16 U/L (ref 15–41)
Albumin: 3.4 g/dL — ABNORMAL LOW (ref 3.5–5.0)
Alkaline Phosphatase: 100 U/L (ref 38–126)
Anion gap: 14 (ref 5–15)
BUN: 10 mg/dL (ref 6–20)
CO2: 23 mmol/L (ref 22–32)
Calcium: 9.4 mg/dL (ref 8.9–10.3)
Chloride: 103 mmol/L (ref 98–111)
Creatinine: 1.23 mg/dL — ABNORMAL HIGH (ref 0.44–1.00)
GFR, Estimated: 52 mL/min — ABNORMAL LOW (ref >60)
Glucose, Bld: 139 mg/dL — ABNORMAL HIGH (ref 70–99)
Potassium: 3.3 mmol/L — ABNORMAL LOW (ref 3.5–5.1)
Sodium: 140 mmol/L (ref 135–145)
Total Bilirubin: 1.9 mg/dL — ABNORMAL HIGH (ref 0.3–1.2)
Total Protein: 7 g/dL (ref 6.5–8.1)

## 2020-08-23 MED ORDER — SODIUM CHLORIDE 0.9 % IJ SOLN
10.0000 mL | INTRAMUSCULAR | Status: DC | PRN
  Administered 2020-08-23: 10 mL via INTRAVENOUS
  Filled 2020-08-23: qty 10

## 2020-08-23 MED ORDER — SODIUM CHLORIDE 0.9 % IV SOLN
200.0000 mg | Freq: Once | INTRAVENOUS | Status: AC
  Administered 2020-08-23: 200 mg via INTRAVENOUS
  Filled 2020-08-23: qty 8

## 2020-08-23 MED ORDER — SODIUM CHLORIDE 0.9 % IV SOLN
Freq: Once | INTRAVENOUS | Status: AC
  Filled 2020-08-23: qty 250

## 2020-08-23 MED ORDER — ACETAMINOPHEN 325 MG PO TABS
650.0000 mg | ORAL_TABLET | Freq: Once | ORAL | Status: AC
  Administered 2020-08-23: 650 mg via ORAL
  Filled 2020-08-23: qty 2

## 2020-08-23 MED ORDER — OXYCODONE HCL 5 MG PO TABS
5.0000 mg | ORAL_TABLET | Freq: Once | ORAL | Status: AC
  Administered 2020-08-23: 5 mg via ORAL
  Filled 2020-08-23: qty 1

## 2020-08-23 MED ORDER — PALONOSETRON HCL INJECTION 0.25 MG/5ML
0.2500 mg | Freq: Once | INTRAVENOUS | Status: AC
  Administered 2020-08-23: 0.25 mg via INTRAVENOUS
  Filled 2020-08-23: qty 5

## 2020-08-23 MED ORDER — SODIUM CHLORIDE 0.9 % IV SOLN
400.0000 mg/m2 | Freq: Once | INTRAVENOUS | Status: AC
  Administered 2020-08-23: 660 mg via INTRAVENOUS
  Filled 2020-08-23: qty 33

## 2020-08-23 MED ORDER — SODIUM CHLORIDE 0.9% FLUSH
10.0000 mL | INTRAVENOUS | Status: DC | PRN
  Administered 2020-08-23: 10 mL
  Filled 2020-08-23: qty 10

## 2020-08-23 MED ORDER — TRASTUZUMAB-DKST CHEMO 150 MG IV SOLR
6.0000 mg/kg | Freq: Once | INTRAVENOUS | Status: AC
  Administered 2020-08-23: 357 mg via INTRAVENOUS
  Filled 2020-08-23: qty 17

## 2020-08-23 MED ORDER — LORATADINE 10 MG PO TABS
10.0000 mg | ORAL_TABLET | Freq: Once | ORAL | Status: AC
  Administered 2020-08-23: 10 mg via ORAL
  Filled 2020-08-23: qty 1

## 2020-08-23 MED ORDER — ZOLPIDEM TARTRATE 10 MG PO TABS: 10.0000 mg | ORAL_TABLET | Freq: Every evening | ORAL | 0 refills | Status: DC | PRN

## 2020-08-23 MED ORDER — DEXAMETHASONE 4 MG PO TABS: 4.0000 mg | ORAL_TABLET | Freq: Every day | ORAL | 0 refills | Status: DC

## 2020-08-23 MED ORDER — SODIUM CHLORIDE 0.9 % IV SOLN
12.0000 mg | Freq: Once | INTRAVENOUS | Status: AC
  Administered 2020-08-23: 12 mg via INTRAVENOUS
  Filled 2020-08-23: qty 1.2

## 2020-08-23 MED ORDER — FLUOROURACIL CHEMO INJECTION 2.5 GM/50ML
400.0000 mg/m2 | Freq: Once | INTRAVENOUS | Status: AC
  Administered 2020-08-23: 650 mg via INTRAVENOUS
  Filled 2020-08-23: qty 13

## 2020-08-23 MED ORDER — SODIUM CHLORIDE 0.9 % IV SOLN
150.0000 mg | Freq: Once | INTRAVENOUS | Status: AC
  Administered 2020-08-23: 150 mg via INTRAVENOUS
  Filled 2020-08-23: qty 150

## 2020-08-23 MED ORDER — OXYCODONE HCL 5 MG PO TABS: 5.0000 mg | ORAL_TABLET | Freq: Once | ORAL | Status: DC

## 2020-08-23 MED ORDER — METHOTREXATE SODIUM (PF) CHEMO INJECTION 250 MG/10ML
30.0000 mg/m2 | Freq: Once | INTRAMUSCULAR | Status: AC
Start: 2020-08-23 — End: 2020-08-23
  Administered 2020-08-23: 50 mg via INTRAVENOUS
  Filled 2020-08-23: qty 2

## 2020-08-23 MED ORDER — POTASSIUM CHLORIDE IN NACL 20-0.9 MEQ/L-% IV SOLN
INTRAVENOUS | Status: AC
  Filled 2020-08-23: qty 1000

## 2020-08-23 MED ORDER — HEPARIN SOD (PORK) LOCK FLUSH 100 UNIT/ML IV SOLN
500.0000 [IU] | Freq: Once | INTRAVENOUS | Status: AC | PRN
  Administered 2020-08-23: 500 [IU]
  Filled 2020-08-23: qty 5

## 2020-08-23 MED ORDER — PROCHLORPERAZINE MALEATE 10 MG PO TABS: 10.0000 mg | ORAL_TABLET | Freq: Four times a day (QID) | ORAL | 1 refills | Status: DC | PRN

## 2020-08-23 MED ORDER — LORAZEPAM 0.5 MG PO TABS: 0.5000 mg | ORAL_TABLET | Freq: Three times a day (TID) | ORAL | 0 refills | Status: DC | PRN

## 2020-08-23 NOTE — Patient Instructions (Signed)
[facility]  Discharge Instructions: Thank you for choosing [facility] to provide your oncology and hematology care.   If you have a lab appointment with the [facility], please go directly to the [facility] and check in at the registration area.   Wear comfortable clothing and clothing appropriate for easy access to any Portacath or PICC line.   We strive to give you quality time with your provider. You may need to reschedule your appointment if you arrive late (15 or more minutes).  Arriving late affects you and other patients whose appointments are after yours.  Also, if you miss three or more appointments without notifying the office, you may be dismissed from the clinic at the provider's discretion.      For prescription refill requests, have your pharmacy contact our office and allow 72 hours for refills to be completed.    Today you received the following chemotherapy and/or immunotherapy agents Trastuzumab, Keytruda, Cytoxan, methotrexate, and 5 FU      To help prevent nausea and vomiting after your treatment, we encourage you to take your nausea medication as directed.  BELOW ARE SYMPTOMS THAT SHOULD BE REPORTED IMMEDIATELY: *FEVER GREATER THAN 100.4 F (38 C) OR HIGHER *CHILLS OR SWEATING *NAUSEA AND VOMITING THAT IS NOT CONTROLLED WITH YOUR NAUSEA MEDICATION *UNUSUAL SHORTNESS OF BREATH *UNUSUAL BRUISING OR BLEEDING *URINARY PROBLEMS (pain or burning when urinating, or frequent urination) *BOWEL PROBLEMS (unusual diarrhea, constipation, pain near the anus) TENDERNESS IN MOUTH AND THROAT WITH OR WITHOUT PRESENCE OF ULCERS (sore throat, sores in mouth, or a toothache) UNUSUAL RASH, SWELLING OR PAIN  UNUSUAL VAGINAL DISCHARGE OR ITCHING   Items with * indicate a potential emergency and should be followed up as soon as possible or go to the Emergency Department if any problems should occur.  Please show the CHEMOTHERAPY ALERT CARD  or IMMUNOTHERAPY ALERT CARD at check-in to the Emergency Department and triage nurse.  Should you have questions after your visit or need to cancel or reschedule your appointment, please contact [facility]  Dept: [phone]  and follow the prompts.  Office hours are 8:00 a.m. to 4:30 p.m. Monday - Friday. Please note that voicemails left after 4:00 p.m. may not be returned until the following business day.  We are closed weekends and major holidays. You have access to a nurse at all times for urgent questions. Please call the main number to the clinic Dept: [phone] and follow the prompts.   For any non-urgent questions, you may also contact your provider using MyChart. We now offer e-Visits for anyone 2 and older to request care online for non-urgent symptoms. For details visit [email or web address].   Also download the MyChart app! Go to the app store, search "MyChart", open the app, select [facility], and log in with your MyChart username and password.  Due to Covid, a mask is required upon entering the hospital/clinic. If you do not have a mask, one will be given to you upon arrival. For doctor visits, patients may have 1 support person aged 19 or older with them. For treatment visits, patients cannot have anyone with them due to current Covid guidelines and our immunocompromised population.

## 2020-08-23 NOTE — Progress Notes (Signed)
Ok to treat with today's labs per Dr.Feng

## 2020-08-23 NOTE — Addendum Note (Signed)
Addended by: Tora Kindred on: 08/23/2020 12:03 PM   Modules accepted: Orders

## 2020-08-23 NOTE — Addendum Note (Signed)
Addended by: Truitt Merle on: 08/23/2020 11:59 AM   Modules accepted: Orders

## 2020-08-24 LAB — CANCER ANTIGEN 27.29: CA 27.29: 106.3 U/mL — ABNORMAL HIGH (ref 0.0–38.6)

## 2020-08-26 ENCOUNTER — Telehealth: Payer: Self-pay | Admitting: Hematology

## 2020-08-26 NOTE — Telephone Encounter (Signed)
Left message with follow-up appointments per 8/12 los.

## 2020-08-27 ENCOUNTER — Inpatient Hospital Stay: Payer: Medicaid Other | Admitting: Hematology

## 2020-08-27 ENCOUNTER — Inpatient Hospital Stay: Payer: Medicaid Other

## 2020-08-29 ENCOUNTER — Encounter: Payer: Self-pay | Admitting: Hematology

## 2020-09-06 ENCOUNTER — Telehealth: Payer: Self-pay

## 2020-09-06 ENCOUNTER — Inpatient Hospital Stay: Payer: Medicaid Other

## 2020-09-06 ENCOUNTER — Inpatient Hospital Stay: Payer: Medicaid Other | Admitting: Hematology

## 2020-09-06 NOTE — Telephone Encounter (Signed)
This nurse attempted to reach patient about missing her appointment on today.  Left a message to return call to clinic.

## 2020-09-09 ENCOUNTER — Other Ambulatory Visit: Payer: Self-pay | Admitting: Hematology

## 2020-09-09 DIAGNOSIS — C7951 Secondary malignant neoplasm of bone: Secondary | ICD-10-CM

## 2020-09-09 DIAGNOSIS — C50911 Malignant neoplasm of unspecified site of right female breast: Secondary | ICD-10-CM

## 2020-09-09 MED ORDER — OXYCODONE HCL 5 MG PO TABS: 5.0000 mg | ORAL_TABLET | Freq: Three times a day (TID) | ORAL | 0 refills | Status: DC | PRN

## 2020-09-13 ENCOUNTER — Ambulatory Visit: Payer: Medicaid Other

## 2020-09-13 ENCOUNTER — Inpatient Hospital Stay: Payer: Medicaid Other

## 2020-09-13 ENCOUNTER — Encounter: Payer: Self-pay | Admitting: Hematology

## 2020-09-13 ENCOUNTER — Other Ambulatory Visit: Payer: Self-pay

## 2020-09-13 ENCOUNTER — Inpatient Hospital Stay: Payer: Medicaid Other | Attending: Hematology | Admitting: Hematology

## 2020-09-13 VITALS — BP 118/63 | HR 78 | Temp 98.1°F | Resp 17 | Wt 130.9 lb

## 2020-09-13 DIAGNOSIS — Z5111 Encounter for antineoplastic chemotherapy: Secondary | ICD-10-CM | POA: Insufficient documentation

## 2020-09-13 DIAGNOSIS — Z95828 Presence of other vascular implants and grafts: Secondary | ICD-10-CM

## 2020-09-13 DIAGNOSIS — C50911 Malignant neoplasm of unspecified site of right female breast: Secondary | ICD-10-CM | POA: Diagnosis not present

## 2020-09-13 DIAGNOSIS — G620 Drug-induced polyneuropathy: Secondary | ICD-10-CM | POA: Diagnosis not present

## 2020-09-13 DIAGNOSIS — H905 Unspecified sensorineural hearing loss: Secondary | ICD-10-CM | POA: Diagnosis not present

## 2020-09-13 DIAGNOSIS — C7951 Secondary malignant neoplasm of bone: Secondary | ICD-10-CM

## 2020-09-13 DIAGNOSIS — R252 Cramp and spasm: Secondary | ICD-10-CM | POA: Diagnosis not present

## 2020-09-13 DIAGNOSIS — Z452 Encounter for adjustment and management of vascular access device: Secondary | ICD-10-CM | POA: Diagnosis not present

## 2020-09-13 DIAGNOSIS — Z170 Estrogen receptor positive status [ER+]: Secondary | ICD-10-CM | POA: Insufficient documentation

## 2020-09-13 DIAGNOSIS — Z5112 Encounter for antineoplastic immunotherapy: Secondary | ICD-10-CM | POA: Diagnosis present

## 2020-09-13 LAB — CMP (CANCER CENTER ONLY)
ALT: 13 U/L (ref 0–44)
AST: 15 U/L (ref 15–41)
Albumin: 3 g/dL — ABNORMAL LOW (ref 3.5–5.0)
Alkaline Phosphatase: 92 U/L (ref 38–126)
Anion gap: 13 (ref 5–15)
BUN: 7 mg/dL (ref 6–20)
CO2: 25 mmol/L (ref 22–32)
Calcium: 9.3 mg/dL (ref 8.9–10.3)
Chloride: 106 mmol/L (ref 98–111)
Creatinine: 0.88 mg/dL (ref 0.44–1.00)
GFR, Estimated: >60 mL/min (ref >60)
Glucose, Bld: 85 mg/dL (ref 70–99)
Potassium: 3.4 mmol/L — ABNORMAL LOW (ref 3.5–5.1)
Sodium: 144 mmol/L (ref 135–145)
Total Bilirubin: 0.8 mg/dL (ref 0.3–1.2)
Total Protein: 6.4 g/dL — ABNORMAL LOW (ref 6.5–8.1)

## 2020-09-13 LAB — CBC WITH DIFFERENTIAL (CANCER CENTER ONLY)
Abs Immature Granulocytes: 0.2 10*3/uL — ABNORMAL HIGH (ref 0.00–0.07)
Basophils Absolute: 0 10*3/uL (ref 0.0–0.1)
Basophils Relative: 1 %
Eosinophils Absolute: 0.1 10*3/uL (ref 0.0–0.5)
Eosinophils Relative: 2 %
HCT: 23.8 % — ABNORMAL LOW (ref 36.0–46.0)
Hemoglobin: 7.8 g/dL — ABNORMAL LOW (ref 12.0–15.0)
Immature Granulocytes: 5 %
Lymphocytes Relative: 18 %
Lymphs Abs: 0.7 10*3/uL (ref 0.7–4.0)
MCH: 31.2 pg (ref 26.0–34.0)
MCHC: 32.8 g/dL (ref 30.0–36.0)
MCV: 95.2 fL (ref 80.0–100.0)
Monocytes Absolute: 0.5 10*3/uL (ref 0.1–1.0)
Monocytes Relative: 12 %
Neutro Abs: 2.5 10*3/uL (ref 1.7–7.7)
Neutrophils Relative %: 62 %
Platelet Count: 207 10*3/uL (ref 150–400)
RBC: 2.5 MIL/uL — ABNORMAL LOW (ref 3.87–5.11)
RDW: 15.1 % (ref 11.5–15.5)
WBC Count: 3.9 10*3/uL — ABNORMAL LOW (ref 4.0–10.5)
nRBC: 0.8 % — ABNORMAL HIGH (ref 0.0–0.2)

## 2020-09-13 MED ORDER — ALTEPLASE 2 MG IJ SOLR
2.0000 mg | Freq: Once | INTRAMUSCULAR | Status: DC | PRN
Start: 2020-09-13 — End: 2020-09-13

## 2020-09-13 MED ORDER — SODIUM CHLORIDE 0.9% FLUSH
10.0000 mL | INTRAVENOUS | Status: DC | PRN
  Administered 2020-09-13: 10 mL via INTRAVENOUS

## 2020-09-13 MED ORDER — SODIUM CHLORIDE 0.9 % IJ SOLN
10.0000 mL | INTRAMUSCULAR | Status: DC | PRN
  Filled 2020-09-13: qty 10

## 2020-09-13 MED ORDER — HEPARIN SOD (PORK) LOCK FLUSH 100 UNIT/ML IV SOLN
500.0000 [IU] | Freq: Once | INTRAVENOUS | Status: AC | PRN
  Administered 2020-09-13: 500 [IU] via INTRAVENOUS

## 2020-09-13 NOTE — Progress Notes (Signed)
[facility]   [phone]) [phone] Fax:([phone]   Clinic Follow up Note   Patient Care Team: [name], [name], [name] as PCP - General (Physician Assistant) [name], MD as Consulting Physician (Orthopedic Surgery)  Date of Service:  09/13/2020  CHIEF COMPLAINT: f/u of metastatic breast cancer  CURRENT THERAPY:  CMF, Beryle Flock and trastuzumab every 3 weeks  ASSESSMENT & PLAN:  [name] is a 55 y.o. female with   1. 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 She had right mastectomy, 4 cycles of AC-T and 5 years of Tamoxifen/AI. -In 06/2015 she had breast cancer recurrence metastatic to the bone.  -she has had multiple endocrine therapy and chemotherapy, overall does not tolerate treatment well and frequently dose reduction -She understands her cancer is not curable, and the treatment goal is to prolong her life and balance her quality of life -We switched her treatment to CMF and Keytruda on 08/02/20, and continue herceptin. -she tolerated first cycle chemo poorly, with recurrent nausea vomiting and anorexia.  I will add dexamethasone and IV Emend as premedication, and decreased her chemo by 20-30% with cycle 2. She still did not tolerate well, with hair thinning, nausea and vomiting, constipation, headaches. -We discussed switching treatment again. I offered fulvestrant, but she refused saying she is unable to tolerate injections in her bottom.  I reviewed that we are running out of treatment options, as she either did not tolerate or did not want. We could also change her current regimen to only cytoxan with [place] and herceptin. She could also stop treatment altogether, but I discussed that her cancer would grow and cause pain and other symptoms. -She did not show for her treatment on 09/06/20, and for whatever reason she is not scheduled for chemo today.  I discussed that we are full here next week but offered her to go to [employer], but she declines. As she is not mentally prepared for chemo today, we will not try to work her in today. We will reschedule her for as soon as possible.  -When she returns, she will receive cytoxan, keytruda, and herceptin.  -She may consider fulvestrant, but if she did she would like a numbing medicine of some kind. I will look into this for her.    2. Goal of care discussion, DNR/DNI -We previously reviewed goal of care and I recommend DNR/DNI, she agreed again today (07/19/20). -I again encouraged her to start setting up her living will. Her husband [name] is automatically her HCPOA. She is considering changing her HCPOA to her daughter [name], whom she lives with.  -Though her care is palliative, our goal is to prolong her life. We discussed this again today (09/13/20). She would like to continue treatment.   3. Body pain, secondary to bone mets, Hypocalcemia, leg cramps   -She is being treated with Zometa q56month. -She will continue oral calcium and Vit D and Prilosec for Acid Reflux.  -Current pain includes, lower back, upper legs, left hip and right shoulder. PET 12/15/19 does indicate disease progression in her spine, pelvis and right scapula. She also has headaches occasionally.  -Her 03/20/20 CT CAP shows possible fracture around pin of left hip from prior surgery.  She was evaluated by orthopedic surgeon Dr. YLorin Mercy who recommended conservative measurements -Her PET 07/18/20 showed worsening bone mets. -Her pain is stable, and she uses oxycodone maybe  1-2 times a day -Patient is currently taking potassium 3 tablets a day   4. Congenital deafness   5. Mild anemia, secondary to chemotherapy and malignancy  -her hgb is down to 7.8. I recommend she receive blood transfusion. She would prefer to try to increase with diet, as she had not been eating for a while. If her counts are still low next week, we  will proceed with blood transfusion. She is agreeable.   6. 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 Mild, only in her feet. -worsened with CMF, will monitor on cytoxan alone.   7. Mild hyperbilirubinemia -likely second to chemotherapy, fluctuates  -Worsened on 03/22/20 with total bilirubin 3.2. Increased again to 3.7 on 04/01/20. -There is subtle change in liver on her 04/09/20 CT CAP, but she declined MRI Liver for further evaluation. -resolved today, bili 0.8 (09/13/20)     PLAN:   -reschedule chemo for next week as soon as possible, she will receive cytoxan, keytruda, and herceptin.  -will proceed with blood transfusion if hgb remains low -Follow-up in IV fluids 1 week after next cycle of chemo -I spoke with infusion nurses and pharmacy regarding lidocaine injection before fulvestrant in the future  No problem-specific Assessment & Plan notes found for this encounter.   SUMMARY OF ONCOLOGIC HISTORY: Oncology History Overview Note   Cancer Staging No matching staging information was found for the patient.     [name], right ([place])  11/1997 Cancer Diagnosis   Patient had 1.4 cm poorly differentiated right breast carcinoma diagnosed in Nov 1999 at age 68, 1/7 axillary nodes involved, ER/PR and HER 2 positive. She had mastectomy with the 7 axillary node evaluation, 4 cycles of adriamycin/ cytoxan followed by taxotere, then five years of tamoxifen thru June 2005 (no herceptin in 1999). She had bilateral oophorectomy in May 2005. She was briefly on aromatase inhibitor after tamoxifen, but discontinued this herself due to poor tolerance.   04/04/2015 Genetic Testing   Negative genetic testing on the breast/ovarian cancer pnael.  The Breast/Ovarian gene panel offered by [facility] includes sequencing and rearrangement analysis for the following 20 genes:  ATM, BARD1, BRCA1, BRCA2, BRIP1, CDH1, CHEK2, EPCAM, FANCC, MLH1, MSH2, MSH6, NBN, PALB2, PMS2, PTEN, RAD51C, RAD51D,  TP53, and XRCC2.   06/26/2015 Pathology Results   Initial Path Report Bone, curettage, Left femur met, intramedullary subtroch tissue - METASTATIC ADENOCARCINOMA.  Estrogen Receptor: 95%, POSITIVE, STRONG STAINING INTENSITY Progesterone Receptor: 2%, POSITIVE, STRONG  HER2 (+), IHC 3+   06/26/2015 Surgery   Biopsy of metastatic tissue and stabilization with Affixus trochanteric nail, proximal and distal interlock of left femur by Dr. [name]    06/28/2015 Imaging   CT Chest w/ Contrast 1. Extensive right internal mammary chain lymphadenopathy consistent with metastatic breast cancer. 2. Lytic metastases involving the posterior elements at T1 with probable nondisplaced pathologic fracture of the spinous process. 3. No other evidence of thoracic metastatic disease. 4. Trace bilateral pleural effusions with associated bibasilar atelectasis. 5. Indeterminate right thyroid nodule, not previously imaged. This could be evaluated with thyroid ultrasound as clinically warranted.   06/29/2015 -  Chemotherapy   Zometa started monthly on 06/29/15 and switched to q52month from 01/08/17   07/01/2015 Imaging   Bone Scan 1. Increased activity noted throughout the left femur. Although metastatic disease cannot be excluded, these changes are most likely secondary to prior surgery. 2. No other focal abnormalities identified to suggest metastatic disease.   07/12/2015 - 07/26/2015 Radiation Therapy  Left femur, 30 Gy in 10 fractions by Dr. Sondra Come    07/14/2015 Initial Diagnosis   [name], right ([place])   07/19/2015 Imaging   Initial PET Scan 1. Severe multifocal osseous metastatic disease, much greater than anticipated based on prior imaging. 2. Multifocal nodal metastases involving the right internal mammary, prevascular and subpectoral lymph nodes. No axillary pulmonary involvement identified. 3. No distant extra osseous metastases.   07/30/2015 - 11/29/2015 Chemotherapy    Docetaxel, Herceptin and Perjeta every 3 weeks X6 cycles, pt tolerated moderately well, restaging scan showed excellent response    09/18/2015 Imaging   CT Head w/wo Contrast 1. No acute intracranial abnormality or significant interval change. 2. Stable minimal periventricular white matter hypoattenuation on the right. This may reflect a remote ischemic injury. 3. No evidence for metastatic disease to the brain. 4. No focal soft tissue lesion to explain the patient's tenderness.   10/30/2015 Imaging   CT Abdomen Pelvis w/ Contrast 1. Few mildly prominent fluid-filled loops of small bowel scattered throughout the abdomen, with intraluminal fluid density within the distal colon. Given the provided history, findings are suggestive of acute enteritis/diarrheal illness. 2. No other acute intra-abdominal or pelvic process identified. 3. Widespread osseous metastatic disease, better evaluated on most recent PET-CT from 07/19/2015. No pathologic fracture or other complication. 4. 11 mm hypodensity within the left kidney. This lesion measures intermediate density, and is indeterminate. While this lesion is similar in size relative to recent studies, this is increased in size relative to prior study from 2012. Further evaluation with dedicated renal mass protocol CT and/or MRI is recommended for complete characterization.   12/2015 - 05/14/2016 Chemotherapy   Maintenance Herceptin and pejeta every 3 weeks stopped on 05/14/16 due to disease progression   12/25/2015 Imaging   Restaging PET Scan 1. Markedly improved skeletal activity, with only several faint foci of residual accentuated metabolic activity, but resolution of the vast majority of the previously extensive osseous metastatic disease. 2. Resolution of the prior right internal mammary and right prevascular lymph nodes. 3. Residual subcutaneous edema and edema along fascia planes in the left upper thigh. This remains somewhat more than I would expect  for placement of an IM nail 6 weeks ago. If there is leg swelling further down, consider Doppler venous ultrasound to rule out left lower extremity DVT. I do not perceive an obvious difference in density between the pelvic and common femoral veins on the noncontrast CT data. 4. Chronic right maxillary and right sphenoid sinusitis.   01/16/2016 - 10/20/2017 Anti-estrogen oral therapy   Letrozole 2.5 mg daily. She was switched to exemestane due to disease progression on 02/08/17. Stopped on 10/20/2017 when treatment changed to chemo    03/01/2016 Miscellaneous   Patient presented to ED following a fall; she reports she landed on her back and is now experiencing back pain. The patient was evaluated and discharge home same day with pain control.   05/01/2016 Imaging   CT CAP IMPRESSION: 1. Stable exam.  No new or progressive disease identified. 2. No evidence for residual or recurrent adenopathy within the chest. 3. Stable bone metastasis.   05/01/2016 Imaging   BONE SCAN IMPRESSION: 1. Small focal uptake involving the anterior rib ends of the left second and right fourth ribs, new since the prior bone scan. The location of this uptake is more suggestive of a traumatic or inflammatory etiology as opposed to metastatic disease. No other evidence of metastatic disease. 2. There are other  areas of stable uptake which are most likely degenerative/reactive, including right shoulder and cervical spine uptake and uptake along the right femur adjacent to the ORIF hardware.   05/20/2016 Imaging   NM PET Skull to Thigh IMPRESSION: 1. There multiple metastatic foci in the bony pelvis, including some new abnormal foci compare to the prior PET-CT, compatible with mildly progressive bony metastatic disease. Also, a left T11 vertebral hypermetabolic metastatic lesion is observed, new compared to the prior exam. 2. No active extraosseous malignancy is currently identified. 3. Right anterior fourth rib  healing fracture, appears be   06/04/2016 - 01/08/2017 Chemotherapy   Second line chemotherapy Kadcyla every 3 weeks started on 06/04/16 and stopped on 01/08/17 due to mixed response.     10/05/2016 Imaging   CT CAP and Bone Scan:  Bones: left intramedullary rod and left femoral head neck screw in place. In the proximal diaphysis of the left femur there is cortical thickening and irregularity. No lytic or blastic bone lesions. No fracture or vertebral endplate destruction.   No definite evidence of recurrent disease in the chest, abdomen, or pelvis.   10/28/2016 Imaging   CT A/P IMPRESSION: No acute process demonstrated in the abdomen or pelvis. No evidence of bowel obstruction or inflammation.   11/04/2016 Imaging   DG foot complete left: IMPRESSION: No fracture or dislocation.  No soft tissue abnormality   11/23/2016 Imaging   CT RIGHT FEMUR IMPRESSION: Negative CT scan of the right thigh.  No visible metastatic disease.  CT LEFT FEMUR IMPRESSION: 1. Postsurgical changes related to prior cephalomedullary rod fixation of the left femur with linear lucency along the anterolateral proximal femoral cortex at level of the lesser trochanter, suspicious for nondisplaced fracture. 2. Slightly permeative appearance of the proximal left femoral cortex at the site of known prior osseous metastases is largely unchanged. 3. Unchanged patchy lucency and sclerosis along the anterior acetabulum, which appears to correspond with an area of faint uptake on the prior PET-CT, suspicious for osseous metastasis. These results will be called to the ordering clinician or representative by the Radiologist Assistant, and communication documented in the PACS or zVision Dashboard.       01/27/2017 Imaging   IMPRESSION: 1. Mixed response to osseous metastasis. Some hypermetabolic lesions are new and increasingly hypermetabolic, while another index lesion is decreasingly hypermetabolic. 2. No  evidence of hypermetabolic soft tissue metastasis.     02/08/2017 - 10/19/2017 Antibody Plan   -Herceptin every 3 week since 02/08/17 -Oral Lanpantinib 1571m and decreased to 508mdue to diarrhea and depression. Due to disease progression she swithced to Verzenio 1509mn 05/04/17. She did not toelrate well so she was switched to Ibrance 100m56m 05/31/17. Due to her neutropenia, Ibrance dose reduced to 75 mg daily, 3 weeks on, one-week off, stopped on 10/20/2017 due to disease progression.    04/30/2017 PET scan   IMPRESSION: 1. Primarily increase in hypermetabolism of osseous metastasis. An isolated right acetabular lesion is decreased in hypermetabolism since the prior exam. 2. No evidence of hypermetabolic soft tissue metastasis. 3. Similar non FDG avid right-sided thyroid nodule, favoring a benign etiology. Recommend attention on follow-up.   08/30/2017 PET scan   08/30/2017 PET Scan  IMPRESSION: 1. Worsening osseous metastatic disease. 2. Focal hypermetabolism in the central spinal canal the L1 level, similar to the prior exam. Metastatic implant cannot be excluded. 3. Slight enlargement of a right thyroid nodule which now appears more cystic in character. Consider further evaluation with thyroid ultrasound. If  patient is clinically hyperthyroid, consider nuclear medicine thyroid uptake and scan   08/30/2017 Progression   08/30/2017 PET Scan  IMPRESSION: 1. Worsening osseous metastatic disease. 2. Focal hypermetabolism in the central spinal canal the L1 level, similar to the prior exam. Metastatic implant cannot be excluded. 3. Slight enlargement of a right thyroid nodule which now appears more cystic in character. Consider further evaluation with thyroid ultrasound. If patient is clinically hyperthyroid, consider nuclear medicine thyroid uptake and scan   09/29/2017 - 10/11/2017 Radiation Therapy   Pt received 27 Gy in 9 fractions directed at the areas of significant uptake noted  on recent bone scan the right pelvis and right proximal femur, with Dr, Sondra Come    10/20/2017 - 04/07/2018 Chemotherapy   -Herceptin every 3 weeks starting 02/08/17, perjeta added on 10/20/2017  -weekly Taxol started on 10/21/2017. Will reduce Taxol to 2 weeks on, 1 week off starting on 02/04/17.  Stoped due to mild disease progression   10/27/2017 Imaging   10/27/2017 CT AP IMPRESSION: 1. No acute abnormality. No evidence of bowel obstruction or acute bowel inflammation. Normal appendix. 2. Stable patchy sclerotic osseous metastases throughout the visualized skeleton. No new or progressive metastatic disease in the abdomen or pelvis.   01/11/2018 Imaging   Whole Body Bone Scan 01/11/18  IMPRESSION: 1. Multifocal osseous metastases as described. 2. Right scapular metastasis. 3. Right superior thoracic spinous process. 4. Right sacral ala and right L5 metastases. 5. Right acetabular metastasis. 6. Right proximal femur metastasis. 7. Diffuse metastases throughout the left femur. 8. Anterior right fourth rib metastasis.   01/11/2018 Imaging   CT CAP W Contrast 01/11/18  IMPRESSION: 1. Similar-appearing osseous metastatic disease. 2. No evidence for progressed metastatic disease in the chest, abdomen or pelvis.   03/30/2018 Imaging   Bone Scan 03/30/18  IMPRESSION: Stable multifocal bony metastatic disease.   03/30/2018 Imaging    CT CAP W Contrast 03/30/18 IMPRESSION: 1. New mild contiguous wall thickening with associated mucosal hyperenhancement throughout the left colon and rectum, suggesting nonspecific infectious or inflammatory proctocolitis. Differential includes C diff colitis. 2. Stable faint patchy sclerosis in the iliac bones and lumbar vertebral bodies. No new or progressive osseous metastatic disease by CT. Please note that the osseous lesions are relatively occult by CT and would be better evaluated by PET-CT, as clinically warranted. 3. No evidence of extra  osseous metastatic disease.   These results will be called to the ordering clinician or representative by the Radiologist Assistant, and communication documented in the PACS or zVision Dashboard.   04/15/2018 - 06/16/2018 Chemotherapy   Enhertu q3weeks starting 04/15/18. Stopped Enhertu after cycle 3 on 05/27/18 due to poor toleration due to poor toleration with anorexia, weight loss, N&V, diarrhea   04/21/2018 PET scan   PET 04/21/18  IMPRESSION: 1. Overall marked interval improvement in bony hypermetabolic disease. All of the previously identified hypermetabolic osseous metastases have decreased in hypermetabolism in the interval. There is a new tiny focus of hypermetabolism in the right aspect of the L3 vertebral body that appears to correspond to a new 8 mm lucency, raising concern for new metastatic disease. Close attention on follow-up recommended. 2. Focal hypermetabolism in the central spinal canal at the level of L1 previously has resolved. 3. No hypermetabolic soft tissue disease on today's study. 4. Right thyroid nodule seen previously has decreased in the interval.     06/16/2018 Imaging   CT LUMBAR SPINE WO CONTRAST 06/16/18 IMPRESSION: 1. Transitional lumbosacral anatomy. Correlation with radiographs is  recommended prior to any operative intervention. 2. Suspicion of a right L3 vertebral body metastasis on the April PET-CT is not evident by CT. Stable degenerative versus metastatic lesion at the right L5 superior endplate. Small but increased right iliac bone metastasis since March. Lumbar MRI would best characterize lumbar metastatic disease (without contrast if necessary). 3. Suspect L2-L3 left subarticular segment disc herniation. Query left L2 radiculitis. 4. Stable appearance of rightward L4-L5 disc degeneration which could affect the right foramen and right lateral recess.   06/17/2018 - 11/16/2018 Chemotherapy   Restart Taxol 2 weeks on/1 week off with Herceptin and  Perjeta q3weeks on 06/17/18. Perjeta was stopped after C5 due to diarrhea. Stopped Taxol after 11/16/18 due to disease progression to L3.    07/06/2018 PET scan   PET  IMPRESSION: 1. Near total resolution of hypermetabolic activity associated with skeletal metastasis. Very mild activity associated with the LEFT iliac bone which is similar to background blood pool activity. 2. No new or progressive disease.   11/21/2018 PET scan   IMPRESSION: 1. New focal hypermetabolic lesion eccentric to the right in the L3 vertebral body, maximum SUV 6.1 compatible with a new focus of active malignancy. 2. Very low-grade activity similar to blood pool in previous existing mildly sclerotic metastatic lesions including the left iliac bone lesion. Some of the mildly sclerotic lesions are not hypermetabolic compatible with effectively treated bony metastatic disease. 3. No hypermetabolic soft tissue lesions are identified.     11/30/2018 - 08/25/2019 Chemotherapy   Continue Herceptin q3weeks. Stopped 08/25/19 due to significant disease progression and switched to  IV Matuzumab   12/12/2018 - 12/17/2018 Chemotherapy   Tucatinib (TUKYSA) 6 tabs BID as a next line therapy starting 12/12/18. Stopped 12/17/18 duet o N&V, poor appetite and HA    12/21/2018 - 03/17/2019 Chemotherapy   IV Eribulin on day 1, 8 every 21 days starting 12/21/18. Stopped 03/17/19 due to mixed response to treatment.    03/21/2019 PET scan   IMPRESSION: 1. Mixed response to therapy of osseous metastasis. Although a right-sided L4 lesion demonstrates decreased hypermetabolism today, new left iliac and right ischial hypermetabolic lesions are seen. 2. No evidence of hypermetabolic soft tissue metastasis.     03/27/2019 - 07/21/2019 Chemotherapy   Neratinib (Nerlynx) titrate up from 178m to 2456mstarting 03/27/19. She could not tolerate 16039mnd stopped on 02/05/19 due to N&V and diarrhea. Will restart at 120m76m 04/10/18-04/27/19 and 4/19-4/22.  Reduced to 80mg26me daily on 05/10/19. Held/stopped since 07/21/19 due to dry cough, Nausea and body aches.   06/23/2019 Imaging   CT Head  IMPRESSION: 1. No acute intracranial abnormality or metastatic disease identified. 2. Chronic white matter disease in the right hemisphere is stable from prior exams.     09/05/2019 PET scan   IMPRESSION: 1. Significant progression of widespread hypermetabolic osseous metastases throughout the axial skeleton as detailed. 2. No hypermetabolic extraosseous sites of metastatic disease.   09/15/2019 - 12/22/2019 Chemotherapy   -IV Margetuximab q3weeks and IV Gemcitabine 2 weeks on/1 week off starting 09/15/19. Carboplatin added to Gemcitabine starting with C3 on 11/03/19. D/c after C5 due to disease progression in bone.    12/13/2019 Imaging   PET  IMPRESSION: 1. Interval progression of widespread, FDG avid osseous metastasis involving the axial skeleton. 2. No FDG avid extra osseous sites of metastatic disease.     01/19/2020 -  Chemotherapy   -Xeloda BID 2 weeks on/1 week off and Herceptin q3weeks starting 01/19/20. C1 at 1000mg38m  BID. C2 increased to 1583m in the AM and 10028min the PM starting 02/12/20.   04/09/2020 Imaging   CT CAP  IMPRESSION: 1. New 3 mm RIGHT upper lobe nodule, nonspecific, attention on follow-up. 2. What may represent an incomplete periprosthetic fracture about the cephalo medullary rod in the LEFT proximal femur. This linear band of incomplete bony fusion that is more pronounced than on previous CT imaging that was acquired in 2017 and may represent a stress type fracture about hardware. Correlate with any pain in this area. Sclerotic margins show chronic features but the linear band passing through the bone around the intramedullary rod is more extensive, involving approximately 270 degrees of the circumference of the femur, medial cortex remains intact. 3. Other findings of bony metastatic disease are similar to  prior imaging. Sclerosis and some periosteal reaction about a nondisplaced pathologic fracture of the LEFT lateral seventh rib. 4. Small hypervascular focus in the RIGHT hepatic lobe, indistinct but new compared to previous imaging. Perhaps this represents a transient phenomenon though is not imaged on the later phase to allow for confirmation. Liver MRI may be helpful for further evaluation, to exclude underlying metastatic lesion as clinically warranted. 5. Hepatic steatosis. 6. Aortic atherosclerosis.   Aortic Atherosclerosis (ICD10-I70.0).     07/18/2020 Imaging   PET  IMPRESSION: No signs of visceral or nodal metastatic disease.   Increased number of hypermetabolic lesions with increased intensity of many of these areas and increased "size" of affected areas based on the regions of FDG uptake. While there are areas that do show diminished FDG uptake such as in the RIGHT scapula on balance the number and size of these areas has increased.   08/02/2020 -  Chemotherapy    Patient is on Treatment Plan: BREAST ADJUVANT CMF IV Q21D   Patient is on Antibody Plan: BREAST TRASTUZUMAB Q21D        INTERVAL HISTORY:  WeTAIJA MATHIASs here for a follow up of metastatic breast cancer. She was last seen by me on 08/23/20. She presents to the clinic accompanied by her husband Debra Carolinand an interpreter. She reports she is feeling better now than she did two weeks ago. She had fever, nausea and vomiting, taste change (metallic), trouble sleeping, constipation with cramping, headaches, leg pain, weakness all over. She notes she was unable to eat for 2 weeks. She denies cough or bladder issues. She notes she almost got into an accident because of weakness in her legs. She also reports she has almost fallen at home but has not.  We discussed changing treatment, continuing her current regimen with reduced dose/less medicines, or stopping altogether. She became tearful because the side effects were  so bad but she is afraid of the cancer growing. She doesn't know what to do.   All other systems were reviewed with the patient and are negative.  MEDICAL HISTORY:  Past Medical History:  Diagnosis Date   Abnormal Pap smear    Anxiety    [place]   [place]   breast-[place]/p mastectomy, chemo, rad   CIN I (cervical intraepithelial neoplasia I) 2003   by colpo   Congenital deafness    Deaf    Depression    Port-A-Cath in place 2017   for chemotherapy   Radiation 07/12/15-07/26/15   left femur 30 Gy   S/P bilateral oophorectomy     SURGICAL HISTORY: Past Surgical History:  Procedure Laterality Date   ABDOMINAL HYSTERECTOMY  INTRAMEDULLARY (IM) NAIL INTERTROCHANTERIC Left 06/26/2015   Procedure: LEFT BIOMET LONG AFFIXS NAIL;  Surgeon: [name], MD;  Location: [facility];  Service: Orthopedics;  Laterality: Left;   IR GENERIC HISTORICAL  08/06/2015   IR US GUIDE VASC ACCESS LEFT 08/06/2015 Aletta Edouard, MD [facility]   IR GENERIC HISTORICAL  08/06/2015   IR FLUORO GUIDE CV LINE LEFT 08/06/2015 Aletta Edouard, MD [facility]   IR GENERIC HISTORICAL  03/05/2016   IR CV LINE INJECTION 03/05/2016 Markus Daft, MD [facility]   LEFT HEART CATH AND CORONARY ANGIOGRAPHY N/A 12/13/2017   Procedure: LEFT HEART CATH AND CORONARY ANGIOGRAPHY;  Surgeon: Leonie Man, MD;  Location: [facility];  Service: Cardiovascular;  Laterality: N/A;   MASTECTOMY     right breast   MASTECTOMY     OVARIAN CYST REMOVAL     RADIOLOGY WITH ANESTHESIA Left 06/25/2015   Procedure: MRI OF LEFT HIP WITH OR WITHOUT CONTRAST;  Surgeon: Medication Radiologist, MD;  Location: [facility];  Service: Radiology;  Laterality: Left;  DR. MCINTYRE/MRI   TUBAL LIGATION      I have reviewed the social history and family history with the patient and they are unchanged from previous note.  ALLERGIES:  is allergic to promethazine hcl, chlorhexidine, and diphenhydramine hcl.  MEDICATIONS:  Current  Outpatient Medications  Medication Sig Dispense Refill   ALPRAZolam (XANAX) 0.25 MG tablet Take 1 tablet (0.25 mg total) by mouth 2 (two) times daily as needed for anxiety. 30 tablet 0   dexamethasone (DECADRON) 4 MG tablet Take 1 tablet (4 mg total) by mouth daily. For 3-5 days after chemo 30 tablet 0   diphenoxylate-atropine (LOMOTIL) 2.5-0.025 MG tablet Take 1-2 tablets by mouth 4 (four) times daily as needed for diarrhea or loose stools. 60 tablet 1   lidocaine-prilocaine (EMLA) cream Apply 1 application topically as needed. Apply to Porta-Cath 1-2 hours prior to access as directed. 90 g 2   LORazepam (ATIVAN) 0.5 MG tablet Take 1 tablet (0.5 mg total) by mouth every 8 (eight) hours as needed for anxiety (nausea). 20 tablet 0   ondansetron (ZOFRAN) 8 MG tablet Take 1 tablet (8 mg total) by mouth every 8 (eight) hours as needed for nausea or vomiting. 45 tablet 1   oxyCODONE (ROXICODONE) 5 MG immediate release tablet Take 1 tablet (5 mg total) by mouth every 8 (eight) hours as needed for severe pain. Refill on 02/17/2020 90 tablet 0   potassium chloride (KLOR-CON) 10 MEQ tablet Take 1 tablet (10 mEq total) by mouth 3 (three) times daily. 90 tablet 2   prochlorperazine (COMPAZINE) 10 MG tablet Take 1 tablet (10 mg total) by mouth every 6 (six) hours as needed for nausea or vomiting. 60 tablet 1   zolpidem (AMBIEN) 10 MG tablet Take 1 tablet (10 mg total) by mouth at bedtime as needed for sleep. 30 tablet 0   No current facility-administered medications for this visit.   Facility-Administered Medications Ordered in Other Visits  Medication Dose Route Frequency Provider Last Rate Last Admin   potassium chloride SA (K-DUR) CR tablet 40 mEq  40 mEq Oral Once Truitt Merle, MD       sodium chloride 0.9 % 1,000 mL with potassium chloride 10 mEq infusion   Intravenous Continuous Livesay, Lennis P, MD   Stopped at 09/10/15 1653   sodium chloride 0.9 % injection 10 mL  10 mL Intravenous PRN Gordy Levan,  MD  PHYSICAL EXAMINATION: ECOG PERFORMANCE STATUS: 2 - Symptomatic, <50% confined to bed  Vitals:   09/13/20 0829  BP: 118/63  Pulse: 78  Resp: 17  Temp: 98.1 F (36.7 C)  SpO2: 99%   Wt Readings from Last 3 Encounters:  09/13/20 130 lb 14.4 oz (59.4 kg)  08/23/20 130 lb 3.2 oz (59.1 kg)  08/02/20 133 lb 11.2 oz (60.6 kg)     GENERAL:alert, no distress and comfortable SKIN: skin color normal, no rashes or significant lesions EYES: normal, Conjunctiva are pink and non-injected, sclera clear  NEURO: alert & oriented x 3 with fluent speech  LABORATORY DATA:  I have reviewed the data as listed CBC Latest Ref Rng & Units 09/13/2020 08/23/2020 08/02/2020  WBC 4.0 - 10.5 K/uL 3.9(L) 2.8(L) 4.0  Hemoglobin 12.0 - 15.0 g/dL 7.8(L) 9.3(L) 11.5(L)  Hematocrit 36.0 - 46.0 % 23.8(L) 27.3(L) 32.1(L)  Platelets 150 - 400 K/uL 207 210 215     CMP Latest Ref Rng & Units 09/13/2020 08/23/2020 08/02/2020  Glucose 70 - 99 mg/dL 85 139(H) 129(H)  BUN 6 - 20 mg/dL 7 10 13   Creatinine 0.44 - 1.00 mg/dL 0.88 1.23(H) 1.11(H)  Sodium 135 - 145 mmol/L 144 140 140  Potassium 3.5 - 5.1 mmol/L 3.4(L) 3.3(L) 3.0(L)  Chloride 98 - 111 mmol/L 106 103 103  CO2 22 - 32 mmol/L 25 23 26   Calcium 8.9 - 10.3 mg/dL 9.3 9.4 9.4  Total Protein 6.5 - 8.1 g/dL 6.4(L) 7.0 7.2  Total Bilirubin 0.3 - 1.2 mg/dL 0.8 1.9(H) 2.1(H)  Alkaline Phos 38 - 126 U/L 92 100 71  AST 15 - 41 U/L 15 16 26   ALT 0 - 44 U/L 13 15 17       RADIOGRAPHIC STUDIES: I have personally reviewed the radiological images as listed and agreed with the findings in the report. No results found.    No orders of the defined types were placed in this encounter.  All questions were answered. The patient knows to call the clinic with any problems, questions or concerns. No barriers to learning was detected. The total time spent in the appointment was 40 minutes.     Truitt Merle, MD 09/13/2020   I, Wilburn Mylar, am acting as scribe for  Truitt Merle, MD.   I have reviewed the above documentation for accuracy and completeness, and I agree with the above.

## 2020-09-14 LAB — CANCER ANTIGEN 27.29: CA 27.29: 119.6 U/mL — ABNORMAL HIGH (ref 0.0–38.6)

## 2020-09-19 ENCOUNTER — Other Ambulatory Visit: Payer: Medicaid Other

## 2020-09-19 ENCOUNTER — Telehealth: Payer: Self-pay | Admitting: Hematology

## 2020-09-19 MED FILL — Fosaprepitant Dimeglumine For IV Infusion 150 MG (Base Eq): INTRAVENOUS | Qty: 5 | Status: AC

## 2020-09-19 NOTE — Telephone Encounter (Signed)
Scheduled follow-up appointments per 9/2 los. Patient is aware.

## 2020-09-20 ENCOUNTER — Other Ambulatory Visit: Payer: Self-pay

## 2020-09-20 ENCOUNTER — Inpatient Hospital Stay: Payer: Medicaid Other

## 2020-09-20 VITALS — BP 111/73 | HR 70 | Temp 98.4°F | Resp 16

## 2020-09-20 DIAGNOSIS — Z5112 Encounter for antineoplastic immunotherapy: Secondary | ICD-10-CM | POA: Diagnosis not present

## 2020-09-20 DIAGNOSIS — C7951 Secondary malignant neoplasm of bone: Secondary | ICD-10-CM

## 2020-09-20 DIAGNOSIS — C50911 Malignant neoplasm of unspecified site of right female breast: Secondary | ICD-10-CM

## 2020-09-20 LAB — CMP (CANCER CENTER ONLY)
ALT: 11 U/L (ref 0–44)
AST: 15 U/L (ref 15–41)
Albumin: 3.2 g/dL — ABNORMAL LOW (ref 3.5–5.0)
Alkaline Phosphatase: 87 U/L (ref 38–126)
Anion gap: 13 (ref 5–15)
BUN: 14 mg/dL (ref 6–20)
CO2: 22 mmol/L (ref 22–32)
Calcium: 9 mg/dL (ref 8.9–10.3)
Chloride: 105 mmol/L (ref 98–111)
Creatinine: 0.99 mg/dL (ref 0.44–1.00)
GFR, Estimated: >60 mL/min (ref >60)
Glucose, Bld: 97 mg/dL (ref 70–99)
Potassium: 3.3 mmol/L — ABNORMAL LOW (ref 3.5–5.1)
Sodium: 140 mmol/L (ref 135–145)
Total Bilirubin: 1.1 mg/dL (ref 0.3–1.2)
Total Protein: 6.3 g/dL — ABNORMAL LOW (ref 6.5–8.1)

## 2020-09-20 LAB — CBC WITH DIFFERENTIAL (CANCER CENTER ONLY)
Abs Immature Granulocytes: 0.04 10*3/uL (ref 0.00–0.07)
Basophils Absolute: 0 10*3/uL (ref 0.0–0.1)
Basophils Relative: 1 %
Eosinophils Absolute: 0.1 10*3/uL (ref 0.0–0.5)
Eosinophils Relative: 1 %
HCT: 25.9 % — ABNORMAL LOW (ref 36.0–46.0)
Hemoglobin: 8.6 g/dL — ABNORMAL LOW (ref 12.0–15.0)
Immature Granulocytes: 1 %
Lymphocytes Relative: 17 %
Lymphs Abs: 0.8 10*3/uL (ref 0.7–4.0)
MCH: 31.4 pg (ref 26.0–34.0)
MCHC: 33.2 g/dL (ref 30.0–36.0)
MCV: 94.5 fL (ref 80.0–100.0)
Monocytes Absolute: 0.5 10*3/uL (ref 0.1–1.0)
Monocytes Relative: 10 %
Neutro Abs: 3.5 10*3/uL (ref 1.7–7.7)
Neutrophils Relative %: 70 %
Platelet Count: 213 10*3/uL (ref 150–400)
RBC: 2.74 MIL/uL — ABNORMAL LOW (ref 3.87–5.11)
RDW: 15.8 % — ABNORMAL HIGH (ref 11.5–15.5)
WBC Count: 5 10*3/uL (ref 4.0–10.5)
nRBC: 0 % (ref 0.0–0.2)

## 2020-09-20 LAB — TSH: TSH: 2.576 u[IU]/mL (ref 0.308–3.960)

## 2020-09-20 MED ORDER — ACETAMINOPHEN 325 MG PO TABS
650.0000 mg | ORAL_TABLET | Freq: Once | ORAL | Status: AC
Start: 2020-09-20 — End: 2020-09-20
  Administered 2020-09-20: 650 mg via ORAL
  Filled 2020-09-20: qty 2

## 2020-09-20 MED ORDER — TRASTUZUMAB-DKST CHEMO 150 MG IV SOLR
6.0000 mg/kg | Freq: Once | INTRAVENOUS | Status: AC
  Administered 2020-09-20: 357 mg via INTRAVENOUS
  Filled 2020-09-20: qty 17

## 2020-09-20 MED ORDER — SODIUM CHLORIDE 0.9 % IV SOLN
200.0000 mg | Freq: Once | INTRAVENOUS | Status: AC
  Administered 2020-09-20: 200 mg via INTRAVENOUS
  Filled 2020-09-20: qty 8

## 2020-09-20 MED ORDER — PALONOSETRON HCL INJECTION 0.25 MG/5ML
0.2500 mg | Freq: Once | INTRAVENOUS | Status: AC
  Administered 2020-09-20: 0.25 mg via INTRAVENOUS
  Filled 2020-09-20: qty 5

## 2020-09-20 MED ORDER — LORATADINE 10 MG PO TABS
10.0000 mg | ORAL_TABLET | Freq: Once | ORAL | Status: AC
  Administered 2020-09-20: 10 mg via ORAL
  Filled 2020-09-20: qty 1

## 2020-09-20 MED ORDER — SODIUM CHLORIDE 0.9 % IV SOLN
400.0000 mg/m2 | Freq: Once | INTRAVENOUS | Status: AC
  Administered 2020-09-20: 660 mg via INTRAVENOUS
  Filled 2020-09-20: qty 33

## 2020-09-20 MED ORDER — SODIUM CHLORIDE 0.9 % IV SOLN
12.0000 mg | Freq: Once | INTRAVENOUS | Status: AC
  Administered 2020-09-20: 12 mg via INTRAVENOUS
  Filled 2020-09-20: qty 1.2

## 2020-09-20 MED ORDER — SODIUM CHLORIDE 0.9 % IV SOLN
150.0000 mg | Freq: Once | INTRAVENOUS | Status: AC
  Administered 2020-09-20: 150 mg via INTRAVENOUS
  Filled 2020-09-20: qty 150
  Filled 2020-09-20: qty 5

## 2020-09-20 MED ORDER — HEPARIN SOD (PORK) LOCK FLUSH 100 UNIT/ML IV SOLN
500.0000 [IU] | Freq: Once | INTRAVENOUS | Status: AC | PRN
  Administered 2020-09-20: 500 [IU]

## 2020-09-20 MED ORDER — SODIUM CHLORIDE 0.9 % IV SOLN: Freq: Once | INTRAVENOUS | Status: AC

## 2020-09-20 MED ORDER — SODIUM CHLORIDE 0.9% FLUSH
10.0000 mL | INTRAVENOUS | Status: DC | PRN
  Administered 2020-09-20: 10 mL

## 2020-09-20 NOTE — Patient Instructions (Addendum)
[facility]  Discharge Instructions: Thank you for choosing [facility] to provide your oncology and hematology care.   If you have a lab appointment with the [facility], please go directly to the [facility] and check in at the registration area.   Wear comfortable clothing and clothing appropriate for easy access to any Portacath or PICC line.   We strive to give you quality time with your provider. You may need to reschedule your appointment if you arrive late (15 or more minutes).  Arriving late affects you and other patients whose appointments are after yours.  Also, if you miss three or more appointments without notifying the office, you may be dismissed from the clinic at the provider's discretion.      For prescription refill requests, have your pharmacy contact our office and allow 72 hours for refills to be completed.    Today you received the following chemotherapy and/or immunotherapy agents: Pembrolizumab (Keytruda), Trastuzumab (OGIVRI), and Cyclophosphamide (Cytoxan).   To help prevent nausea and vomiting after your treatment, we encourage you to take your nausea medication as directed.  BELOW ARE SYMPTOMS THAT SHOULD BE REPORTED IMMEDIATELY: *FEVER GREATER THAN 100.4 F (38 C) OR HIGHER *CHILLS OR SWEATING *NAUSEA AND VOMITING THAT IS NOT CONTROLLED WITH YOUR NAUSEA MEDICATION *UNUSUAL SHORTNESS OF BREATH *UNUSUAL BRUISING OR BLEEDING *URINARY PROBLEMS (pain or burning when urinating, or frequent urination) *BOWEL PROBLEMS (unusual diarrhea, constipation, pain near the anus) TENDERNESS IN MOUTH AND THROAT WITH OR WITHOUT PRESENCE OF ULCERS (sore throat, sores in mouth, or a toothache) UNUSUAL RASH, SWELLING OR PAIN  UNUSUAL VAGINAL DISCHARGE OR ITCHING   Items with * indicate a potential emergency and should be followed up as soon as possible or go to the Emergency Department if any problems should occur.  Please show the  CHEMOTHERAPY ALERT CARD or IMMUNOTHERAPY ALERT CARD at check-in to the Emergency Department and triage nurse.  Should you have questions after your visit or need to cancel or reschedule your appointment, please contact [facility]  Dept: [phone]  and follow the prompts.  Office hours are 8:00 a.m. to 4:30 p.m. Monday - Friday. Please note that voicemails left after 4:00 p.m. may not be returned until the following business day.  We are closed weekends and major holidays. You have access to a nurse at all times for urgent questions. Please call the main number to the clinic Dept: [phone] and follow the prompts.   For any non-urgent questions, you may also contact your provider using MyChart. We now offer e-Visits for anyone 77 and older to request care online for non-urgent symptoms. For details visit [email or web address].   Also download the MyChart app! Go to the app store, search "MyChart", open the app, select , and log in with your MyChart username and password.  Due to Covid, a mask is required upon entering the hospital/clinic. If you do not have a mask, one will be given to you upon arrival. For doctor visits, patients may have 1 support person aged 67 or older with them. For treatment visits, patients cannot have anyone with them due to current Covid guidelines and our immunocompromised population.

## 2020-09-22 ENCOUNTER — Other Ambulatory Visit: Payer: Self-pay | Admitting: Hematology

## 2020-09-23 ENCOUNTER — Encounter: Payer: Self-pay | Admitting: Hematology

## 2020-09-24 ENCOUNTER — Encounter (HOSPITAL_COMMUNITY): Payer: Self-pay

## 2020-09-27 ENCOUNTER — Inpatient Hospital Stay: Payer: Medicaid Other

## 2020-09-27 ENCOUNTER — Inpatient Hospital Stay: Payer: Medicaid Other | Admitting: Hematology

## 2020-10-07 ENCOUNTER — Telehealth: Payer: Self-pay

## 2020-10-07 NOTE — Telephone Encounter (Signed)
This nurse received a message from patient stating that she is having really bad pelvic and hip pain.  Patient feels that she needs to have some type of scan done to find out what is causing the pain.  No further concerns noted at this time.  Information forwarded to MD for recommendation.

## 2020-10-07 NOTE — Telephone Encounter (Signed)
This nurse spoke with patient and reminded of her appointment on Friday and Md stated they can discuss scans at the office visit on Friday.  Patient acknowledged understanding.  No further questions or concerns at this time.

## 2020-10-10 MED FILL — Fosaprepitant Dimeglumine For IV Infusion 150 MG (Base Eq): INTRAVENOUS | Qty: 5 | Status: AC

## 2020-10-11 ENCOUNTER — Inpatient Hospital Stay (HOSPITAL_BASED_OUTPATIENT_CLINIC_OR_DEPARTMENT_OTHER): Payer: Medicaid Other | Admitting: Hematology

## 2020-10-11 ENCOUNTER — Other Ambulatory Visit: Payer: Self-pay

## 2020-10-11 ENCOUNTER — Encounter: Payer: Self-pay | Admitting: Hematology

## 2020-10-11 ENCOUNTER — Inpatient Hospital Stay: Payer: Medicaid Other

## 2020-10-11 VITALS — BP 125/66 | HR 67 | Temp 97.8°F | Resp 18 | Wt 130.4 lb

## 2020-10-11 DIAGNOSIS — C7951 Secondary malignant neoplasm of bone: Secondary | ICD-10-CM | POA: Diagnosis not present

## 2020-10-11 DIAGNOSIS — G4700 Insomnia, unspecified: Secondary | ICD-10-CM

## 2020-10-11 DIAGNOSIS — C50911 Malignant neoplasm of unspecified site of right female breast: Secondary | ICD-10-CM | POA: Diagnosis not present

## 2020-10-11 DIAGNOSIS — Z95828 Presence of other vascular implants and grafts: Secondary | ICD-10-CM

## 2020-10-11 DIAGNOSIS — Z5112 Encounter for antineoplastic immunotherapy: Secondary | ICD-10-CM | POA: Diagnosis not present

## 2020-10-11 LAB — CBC WITH DIFFERENTIAL (CANCER CENTER ONLY)
Abs Immature Granulocytes: 0.04 10*3/uL (ref 0.00–0.07)
Basophils Absolute: 0 10*3/uL (ref 0.0–0.1)
Basophils Relative: 1 %
Eosinophils Absolute: 0.1 10*3/uL (ref 0.0–0.5)
Eosinophils Relative: 2 %
HCT: 27.6 % — ABNORMAL LOW (ref 36.0–46.0)
Hemoglobin: 9.2 g/dL — ABNORMAL LOW (ref 12.0–15.0)
Immature Granulocytes: 1 %
Lymphocytes Relative: 20 %
Lymphs Abs: 0.6 10*3/uL — ABNORMAL LOW (ref 0.7–4.0)
MCH: 30.4 pg (ref 26.0–34.0)
MCHC: 33.3 g/dL (ref 30.0–36.0)
MCV: 91.1 fL (ref 80.0–100.0)
Monocytes Absolute: 0.4 10*3/uL (ref 0.1–1.0)
Monocytes Relative: 12 %
Neutro Abs: 2 10*3/uL (ref 1.7–7.7)
Neutrophils Relative %: 64 %
Platelet Count: 205 10*3/uL (ref 150–400)
RBC: 3.03 MIL/uL — ABNORMAL LOW (ref 3.87–5.11)
RDW: 14.2 % (ref 11.5–15.5)
WBC Count: 3.2 10*3/uL — ABNORMAL LOW (ref 4.0–10.5)
nRBC: 0 % (ref 0.0–0.2)

## 2020-10-11 LAB — CMP (CANCER CENTER ONLY)
ALT: 9 U/L (ref 0–44)
AST: 16 U/L (ref 15–41)
Albumin: 3.4 g/dL — ABNORMAL LOW (ref 3.5–5.0)
Alkaline Phosphatase: 84 U/L (ref 38–126)
Anion gap: 12 (ref 5–15)
BUN: 5 mg/dL — ABNORMAL LOW (ref 6–20)
CO2: 24 mmol/L (ref 22–32)
Calcium: 9.3 mg/dL (ref 8.9–10.3)
Chloride: 107 mmol/L (ref 98–111)
Creatinine: 0.85 mg/dL (ref 0.44–1.00)
GFR, Estimated: >60 mL/min (ref >60)
Glucose, Bld: 87 mg/dL (ref 70–99)
Potassium: 3.6 mmol/L (ref 3.5–5.1)
Sodium: 143 mmol/L (ref 135–145)
Total Bilirubin: 1.1 mg/dL (ref 0.3–1.2)
Total Protein: 6.7 g/dL (ref 6.5–8.1)

## 2020-10-11 MED ORDER — SODIUM CHLORIDE 0.9% FLUSH
10.0000 mL | INTRAVENOUS | Status: DC | PRN
  Administered 2020-10-11: 10 mL

## 2020-10-11 MED ORDER — SODIUM CHLORIDE 0.9 % IV SOLN: Freq: Once | INTRAVENOUS | Status: AC

## 2020-10-11 MED ORDER — SODIUM CHLORIDE 0.9% FLUSH
10.0000 mL | INTRAVENOUS | Status: DC | PRN
  Administered 2020-10-11: 10 mL via INTRAVENOUS

## 2020-10-11 MED ORDER — LORATADINE 10 MG PO TABS
10.0000 mg | ORAL_TABLET | Freq: Once | ORAL | Status: AC
  Administered 2020-10-11: 10 mg via ORAL
  Filled 2020-10-11: qty 1

## 2020-10-11 MED ORDER — HEPARIN SOD (PORK) LOCK FLUSH 100 UNIT/ML IV SOLN
500.0000 [IU] | Freq: Once | INTRAVENOUS | Status: AC | PRN
  Administered 2020-10-11: 500 [IU]

## 2020-10-11 MED ORDER — ZOLPIDEM TARTRATE 10 MG PO TABS: 10.0000 mg | ORAL_TABLET | Freq: Every evening | ORAL | 0 refills | Status: DC | PRN

## 2020-10-11 MED ORDER — SODIUM CHLORIDE 0.9 % IV SOLN
200.0000 mg | Freq: Once | INTRAVENOUS | Status: AC
  Administered 2020-10-11: 200 mg via INTRAVENOUS
  Filled 2020-10-11: qty 8

## 2020-10-11 MED ORDER — PALONOSETRON HCL INJECTION 0.25 MG/5ML
0.2500 mg | Freq: Once | INTRAVENOUS | Status: AC
  Administered 2020-10-11: 0.25 mg via INTRAVENOUS
  Filled 2020-10-11: qty 5

## 2020-10-11 MED ORDER — SODIUM CHLORIDE 0.9 % IV SOLN
150.0000 mg | Freq: Once | INTRAVENOUS | Status: AC
  Administered 2020-10-11: 150 mg via INTRAVENOUS
  Filled 2020-10-11: qty 150

## 2020-10-11 MED ORDER — TRASTUZUMAB-DKST CHEMO 150 MG IV SOLR
6.0000 mg/kg | Freq: Once | INTRAVENOUS | Status: AC
  Administered 2020-10-11: 357 mg via INTRAVENOUS
  Filled 2020-10-11: qty 17

## 2020-10-11 MED ORDER — MORPHINE SULFATE 15 MG PO TABS: 15.0000 mg | ORAL_TABLET | Freq: Four times a day (QID) | ORAL | 0 refills | Status: DC | PRN

## 2020-10-11 MED ORDER — SODIUM CHLORIDE 0.9 % IV SOLN
400.0000 mg/m2 | Freq: Once | INTRAVENOUS | Status: AC
  Administered 2020-10-11: 660 mg via INTRAVENOUS
  Filled 2020-10-11: qty 33

## 2020-10-11 MED ORDER — SODIUM CHLORIDE 0.9 % IV SOLN
12.0000 mg | Freq: Once | INTRAVENOUS | Status: AC
  Administered 2020-10-11: 12 mg via INTRAVENOUS
  Filled 2020-10-11: qty 1.2

## 2020-10-11 MED ORDER — ACETAMINOPHEN 325 MG PO TABS
650.0000 mg | ORAL_TABLET | Freq: Once | ORAL | Status: AC
  Administered 2020-10-11: 650 mg via ORAL
  Filled 2020-10-11: qty 2

## 2020-10-11 NOTE — Patient Instructions (Signed)
[facility]  Discharge Instructions: Thank you for choosing [facility] to provide your oncology and hematology care.   If you have a lab appointment with the [facility], please go directly to the [facility] and check in at the registration area.   Wear comfortable clothing and clothing appropriate for easy access to any Portacath or PICC line.   We strive to give you quality time with your provider. You may need to reschedule your appointment if you arrive late (15 or more minutes).  Arriving late affects you and other patients whose appointments are after yours.  Also, if you miss three or more appointments without notifying the office, you may be dismissed from the clinic at the provider's discretion.      For prescription refill requests, have your pharmacy contact our office and allow 72 hours for refills to be completed.    Today you received the following chemotherapy and/or immunotherapy agents : Keytruda, Trastuzumab, Cytoxan      To help prevent nausea and vomiting after your treatment, we encourage you to take your nausea medication as directed.  BELOW ARE SYMPTOMS THAT SHOULD BE REPORTED IMMEDIATELY: *FEVER GREATER THAN 100.4 F (38 C) OR HIGHER *CHILLS OR SWEATING *NAUSEA AND VOMITING THAT IS NOT CONTROLLED WITH YOUR NAUSEA MEDICATION *UNUSUAL SHORTNESS OF BREATH *UNUSUAL BRUISING OR BLEEDING *URINARY PROBLEMS (pain or burning when urinating, or frequent urination) *BOWEL PROBLEMS (unusual diarrhea, constipation, pain near the anus) TENDERNESS IN MOUTH AND THROAT WITH OR WITHOUT PRESENCE OF ULCERS (sore throat, sores in mouth, or a toothache) UNUSUAL RASH, SWELLING OR PAIN  UNUSUAL VAGINAL DISCHARGE OR ITCHING   Items with * indicate a potential emergency and should be followed up as soon as possible or go to the Emergency Department if any problems should occur.  Please show the CHEMOTHERAPY ALERT CARD or IMMUNOTHERAPY  ALERT CARD at check-in to the Emergency Department and triage nurse.  Should you have questions after your visit or need to cancel or reschedule your appointment, please contact [facility]  Dept: [phone]  and follow the prompts.  Office hours are 8:00 a.m. to 4:30 p.m. Monday - Friday. Please note that voicemails left after 4:00 p.m. may not be returned until the following business day.  We are closed weekends and major holidays. You have access to a nurse at all times for urgent questions. Please call the main number to the clinic Dept: [phone] and follow the prompts.   For any non-urgent questions, you may also contact your provider using MyChart. We now offer e-Visits for anyone 37 and older to request care online for non-urgent symptoms. For details visit [email or web address].   Also download the MyChart app! Go to the app store, search "MyChart", open the app, select [facility], and log in with your MyChart username and password.  Due to Covid, a mask is required upon entering the hospital/clinic. If you do not have a mask, one will be given to you upon arrival. For doctor visits, patients may have 1 support person aged 60 or older with them. For treatment visits, patients cannot have anyone with them due to current Covid guidelines and our immunocompromised population.

## 2020-10-11 NOTE — Progress Notes (Signed)
[facility]   [phone] [phone]   Clinic Follow up Note   Patient Care Team: [name], [name], [name] as PCP - General (Physician Assistant) [name], MD as Consulting Physician (Orthopedic Surgery)  Date of Service:  10/11/2020  CHIEF COMPLAINT: f/u of metastatic breast cancer  CURRENT THERAPY:  CMF, Beryle Flock and trastuzumab every 3 weeks  ASSESSMENT & PLAN:  [name] is a 55 y.o. female with   1. 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 She had right mastectomy, 4 cycles of AC-T and 5 years of Tamoxifen/AI. -In 06/2015 she had breast cancer recurrence metastatic to the bone.  -she has had multiple endocrine therapy and chemotherapy, overall does not tolerate treatment well and frequently dose reduction -She understands her cancer is not curable, and the treatment goal is to prolong her life and balance her quality of life -We switched her treatment to CMF and Keytruda on 08/02/20, and continue herceptin. She tolerated first cycle chemo poorly, with recurrent nausea vomiting and anorexia. Despite adding dexamethasone and IV Emend as premedication and decreasing her chemo by 20-30% with cycle 2, she still did not tolerate well, with hair thinning, nausea and vomiting, constipation, headaches. -We dropped the methotrexate and fluorouracil and delayed her third cycle to 09/20/20, so she received cytoxan, Beryle Flock, and herceptin. She tolerated much better with just some fatigue and mild headaches. -I will order restaging PET scan due to her increased pain. Hopefully this will be approved.   2. Goal of care discussion, DNR/DNI -We previously reviewed goal of care and I recommend DNR/DNI, she agreed again (07/19/20). -I again encouraged her to start setting up her living will. Her husband [name] is automatically her HCPOA. She is considering  changing her HCPOA to her daughter [name], whom she lives with.  -Though her care is palliative, our goal is to prolong her life. We discussed this again on 09/13/20. She would like to continue treatment.   3. Body pain, secondary to bone mets, Hypocalcemia, leg cramps   -She is being treated with Zometa q19month. -She will continue oral calcium and Vit D and Prilosec for Acid Reflux.  -Current pain includes, lower back, upper legs, left hip and right shoulder. PET 12/15/19 does indicate disease progression in her spine, pelvis and right scapula. She also has headaches occasionally.  -Her 03/20/20 CT CAP shows possible fracture around pin of left hip from prior surgery.  She was evaluated by orthopedic surgeon Dr. YLorin Mercy who recommended conservative measurements -Her PET 07/18/20 showed worsening bone mets. -her hip and leg pain has increased recently. The oxycodone is not helping anymore. I will switch her to morphine to see if this helps. If this is not enough, I will add on a long-acting medicine. I discussed I do not usually add on two medicines at one, so we will add it later if needed.   4. Congenital deafness   5. Mild anemia, secondary to chemotherapy and malignancy  -her hgb decreased to 7.8 on 09/13/20.  -she tolerated her last cycle better, and she was able to eat. Her hgb has improved to 9.2 today (10/11/20).   6. 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 Mild, only in her feet. -worsened with CMF, will monitor on cytoxan alone.   7. Mild hyperbilirubinemia -likely second to chemotherapy, fluctuates  -Worsened on 03/22/20 with total bilirubin 3.2. Increased again to 3.7 on 04/01/20. -There  is subtle change in liver on her 04/09/20 CT CAP, but she declined MRI Liver for further evaluation. -resolved today, bili 0.8 (09/13/20)     PLAN:   -continue cytoxan, keytruda, and herceptin. -I called in morphineIR to replace her oxycodone and refilled her Lorrin Mais -cancel appointments on 10/18/20 -lab,  flush, f/u, and next chemo in 3 weeks -f/u in 2 weeks with restaging scan a few days prior. -she will contact us if her pain is not controlled next and I will add on MS contin   No problem-specific Assessment & Plan notes found for this encounter.   SUMMARY OF ONCOLOGIC HISTORY: Oncology History Overview Note   Cancer Staging No matching staging information was found for the patient.     [name], right ([place])  11/1997 Cancer Diagnosis   Patient had 1.4 cm poorly differentiated right breast carcinoma diagnosed in Nov 1999 at age 76, 1/7 axillary nodes involved, ER/PR and HER 2 positive. She had mastectomy with the 7 axillary node evaluation, 4 cycles of adriamycin/ cytoxan followed by taxotere, then five years of tamoxifen thru June 2005 (no herceptin in 1999). She had bilateral oophorectomy in May 2005. She was briefly on aromatase inhibitor after tamoxifen, but discontinued this herself due to poor tolerance.   04/04/2015 Genetic Testing   Negative genetic testing on the breast/ovarian cancer pnael.  The Breast/Ovarian gene panel offered by [facility] includes sequencing and rearrangement analysis for the following 20 genes:  ATM, BARD1, BRCA1, BRCA2, BRIP1, CDH1, CHEK2, EPCAM, FANCC, MLH1, MSH2, MSH6, NBN, PALB2, PMS2, PTEN, RAD51C, RAD51D, TP53, and XRCC2.   06/26/2015 Pathology Results   Initial Path Report Bone, curettage, Left femur met, intramedullary subtroch tissue - METASTATIC ADENOCARCINOMA.  Estrogen Receptor: 95%, POSITIVE, STRONG STAINING INTENSITY Progesterone Receptor: 2%, POSITIVE, STRONG  HER2 (+), IHC 3+   06/26/2015 Surgery   Biopsy of metastatic tissue and stabilization with Affixus trochanteric nail, proximal and distal interlock of left femur by Dr. [name]    06/28/2015 Imaging   CT Chest w/ Contrast 1. Extensive right internal mammary chain lymphadenopathy consistent with metastatic breast cancer. 2. Lytic metastases involving the  posterior elements at T1 with probable nondisplaced pathologic fracture of the spinous process. 3. No other evidence of thoracic metastatic disease. 4. Trace bilateral pleural effusions with associated bibasilar atelectasis. 5. Indeterminate right thyroid nodule, not previously imaged. This could be evaluated with thyroid ultrasound as clinically warranted.   06/29/2015 -  Chemotherapy   Zometa started monthly on 06/29/15 and switched to q26month from 01/08/17   07/01/2015 Imaging   Bone Scan 1. Increased activity noted throughout the left femur. Although metastatic disease cannot be excluded, these changes are most likely secondary to prior surgery. 2. No other focal abnormalities identified to suggest metastatic disease.   07/12/2015 - 07/26/2015 Radiation Therapy   Left femur, 30 Gy in 10 fractions by Dr. [name]   07/14/2015 Initial Diagnosis   [name], right ([place]   07/19/2015 Imaging   Initial PET Scan 1. Severe multifocal osseous metastatic disease, much greater than anticipated based on prior imaging. 2. Multifocal nodal metastases involving the right internal mammary, prevascular and subpectoral lymph nodes. No axillary pulmonary involvement identified. 3. No distant extra osseous metastases.   07/30/2015 - 11/29/2015 Chemotherapy   Docetaxel, Herceptin and Perjeta every 3 weeks X6 cycles, pt tolerated moderately well, restaging scan showed excellent response    09/18/2015 Imaging   CT Head w/wo Contrast 1. No acute  intracranial abnormality or significant interval change. 2. Stable minimal periventricular white matter hypoattenuation on the right. This may reflect a remote ischemic injury. 3. No evidence for metastatic disease to the brain. 4. No focal soft tissue lesion to explain the patient's tenderness.   10/30/2015 Imaging   CT Abdomen Pelvis w/ Contrast 1. Few mildly prominent fluid-filled loops of small bowel scattered throughout the abdomen,  with intraluminal fluid density within the distal colon. Given the provided history, findings are suggestive of acute enteritis/diarrheal illness. 2. No other acute intra-abdominal or pelvic process identified. 3. Widespread osseous metastatic disease, better evaluated on most recent PET-CT from 07/19/2015. No pathologic fracture or other complication. 4. 11 mm hypodensity within the left kidney. This lesion measures intermediate density, and is indeterminate. While this lesion is similar in size relative to recent studies, this is increased in size relative to prior study from 2012. Further evaluation with dedicated renal mass protocol CT and/or MRI is recommended for complete characterization.   12/2015 - 05/14/2016 Chemotherapy   Maintenance Herceptin and pejeta every 3 weeks stopped on 05/14/16 due to disease progression   12/25/2015 Imaging   Restaging PET Scan 1. Markedly improved skeletal activity, with only several faint foci of residual accentuated metabolic activity, but resolution of the vast majority of the previously extensive osseous metastatic disease. 2. Resolution of the prior right internal mammary and right prevascular lymph nodes. 3. Residual subcutaneous edema and edema along fascia planes in the left upper thigh. This remains somewhat more than I would expect for placement of an IM nail 6 weeks ago. If there is leg swelling further down, consider Doppler venous ultrasound to rule out left lower extremity DVT. I do not perceive an obvious difference in density between the pelvic and common femoral veins on the noncontrast CT data. 4. Chronic right maxillary and right sphenoid sinusitis.   01/16/2016 - 10/20/2017 Anti-estrogen oral therapy   Letrozole 2.5 mg daily. She was switched to exemestane due to disease progression on 02/08/17. Stopped on 10/20/2017 when treatment changed to chemo    03/01/2016 Miscellaneous   Patient presented to ED following a fall; she reports she landed on  her back and is now experiencing back pain. The patient was evaluated and discharge home same day with pain control.   05/01/2016 Imaging   CT CAP IMPRESSION: 1. Stable exam.  No new or progressive disease identified. 2. No evidence for residual or recurrent adenopathy within the chest. 3. Stable bone metastasis.   05/01/2016 Imaging   BONE SCAN IMPRESSION: 1. Small focal uptake involving the anterior rib ends of the left second and right fourth ribs, new since the prior bone scan. The location of this uptake is more suggestive of a traumatic or inflammatory etiology as opposed to metastatic disease. No other evidence of metastatic disease. 2. There are other areas of stable uptake which are most likely degenerative/reactive, including right shoulder and cervical spine uptake and uptake along the right femur adjacent to the ORIF hardware.   05/20/2016 Imaging   NM PET Skull to Thigh IMPRESSION: 1. There multiple metastatic foci in the bony pelvis, including some new abnormal foci compare to the prior PET-CT, compatible with mildly progressive bony metastatic disease. Also, a left T11 vertebral hypermetabolic metastatic lesion is observed, new compared to the prior exam. 2. No active extraosseous malignancy is currently identified. 3. Right anterior fourth rib healing fracture, appears be   06/04/2016 - 01/08/2017 Chemotherapy   Second line chemotherapy Kadcyla every 3 weeks started  on 06/04/16 and stopped on 01/08/17 due to mixed response.     10/05/2016 Imaging   CT CAP and Bone Scan:  Bones: left intramedullary rod and left femoral head neck screw in place. In the proximal diaphysis of the left femur there is cortical thickening and irregularity. No lytic or blastic bone lesions. No fracture or vertebral endplate destruction.   No definite evidence of recurrent disease in the chest, abdomen, or pelvis.   10/28/2016 Imaging   CT A/P IMPRESSION: No acute process demonstrated  in the abdomen or pelvis. No evidence of bowel obstruction or inflammation.   11/04/2016 Imaging   DG foot complete left: IMPRESSION: No fracture or dislocation.  No soft tissue abnormality   11/23/2016 Imaging   CT RIGHT FEMUR IMPRESSION: Negative CT scan of the right thigh.  No visible metastatic disease.  CT LEFT FEMUR IMPRESSION: 1. Postsurgical changes related to prior cephalomedullary rod fixation of the left femur with linear lucency along the anterolateral proximal femoral cortex at level of the lesser trochanter, suspicious for nondisplaced fracture. 2. Slightly permeative appearance of the proximal left femoral cortex at the site of known prior osseous metastases is largely unchanged. 3. Unchanged patchy lucency and sclerosis along the anterior acetabulum, which appears to correspond with an area of faint uptake on the prior PET-CT, suspicious for osseous metastasis. These results will be called to the ordering clinician or representative by the Radiologist Assistant, and communication documented in the PACS or zVision Dashboard.       01/27/2017 Imaging   IMPRESSION: 1. Mixed response to osseous metastasis. Some hypermetabolic lesions are new and increasingly hypermetabolic, while another index lesion is decreasingly hypermetabolic. 2. No evidence of hypermetabolic soft tissue metastasis.     02/08/2017 - 10/19/2017 Antibody Plan   -Herceptin every 3 week since 02/08/17 -Oral Lanpantinib $RemoveBeforeDEI'1500mg'wosvXHAwBPUTJqgs$  and decreased to $RemoveBefo'500mg'nNbgorlOFIf$  due to diarrhea and depression. Due to disease progression she swithced to Verzenio $RemoveBef'150mg'rcPQkCmUch$  on 05/04/17. She did not toelrate well so she was switched to Ibrance $RemoveBe'100mg'eJHAHjYTH$  on 05/31/17. Due to her neutropenia, Ibrance dose reduced to 75 mg daily, 3 weeks on, one-week off, stopped on 10/20/2017 due to disease progression.    04/30/2017 PET scan   IMPRESSION: 1. Primarily increase in hypermetabolism of osseous metastasis. An isolated right acetabular lesion is  decreased in hypermetabolism since the prior exam. 2. No evidence of hypermetabolic soft tissue metastasis. 3. Similar non FDG avid right-sided thyroid nodule, favoring a benign etiology. Recommend attention on follow-up.   08/30/2017 PET scan   08/30/2017 PET Scan  IMPRESSION: 1. Worsening osseous metastatic disease. 2. Focal hypermetabolism in the central spinal canal the L1 level, similar to the prior exam. Metastatic implant cannot be excluded. 3. Slight enlargement of a right thyroid nodule which now appears more cystic in character. Consider further evaluation with thyroid ultrasound. If patient is clinically hyperthyroid, consider nuclear medicine thyroid uptake and scan   08/30/2017 Progression   08/30/2017 PET Scan  IMPRESSION: 1. Worsening osseous metastatic disease. 2. Focal hypermetabolism in the central spinal canal the L1 level, similar to the prior exam. Metastatic implant cannot be excluded. 3. Slight enlargement of a right thyroid nodule which now appears more cystic in character. Consider further evaluation with thyroid ultrasound. If patient is clinically hyperthyroid, consider nuclear medicine thyroid uptake and scan   09/29/2017 - 10/11/2017 Radiation Therapy   Pt received 27 Gy in 9 fractions directed at the areas of significant uptake noted on recent bone scan the right pelvis and right  proximal femur, with Dr, Sondra Come    10/20/2017 - 04/07/2018 Chemotherapy   -Herceptin every 3 weeks starting 02/08/17, perjeta added on 10/20/2017  -weekly Taxol started on 10/21/2017. Will reduce Taxol to 2 weeks on, 1 week off starting on 02/04/17.  Stoped due to mild disease progression   10/27/2017 Imaging   10/27/2017 CT AP IMPRESSION: 1. No acute abnormality. No evidence of bowel obstruction or acute bowel inflammation. Normal appendix. 2. Stable patchy sclerotic osseous metastases throughout the visualized skeleton. No new or progressive metastatic disease in the abdomen  or pelvis.   01/11/2018 Imaging   Whole Body Bone Scan 01/11/18  IMPRESSION: 1. Multifocal osseous metastases as described. 2. Right scapular metastasis. 3. Right superior thoracic spinous process. 4. Right sacral ala and right L5 metastases. 5. Right acetabular metastasis. 6. Right proximal femur metastasis. 7. Diffuse metastases throughout the left femur. 8. Anterior right fourth rib metastasis.   01/11/2018 Imaging   CT CAP W Contrast 01/11/18  IMPRESSION: 1. Similar-appearing osseous metastatic disease. 2. No evidence for progressed metastatic disease in the chest, abdomen or pelvis.   03/30/2018 Imaging   Bone Scan 03/30/18  IMPRESSION: Stable multifocal bony metastatic disease.   03/30/2018 Imaging    CT CAP W Contrast 03/30/18 IMPRESSION: 1. New mild contiguous wall thickening with associated mucosal hyperenhancement throughout the left colon and rectum, suggesting nonspecific infectious or inflammatory proctocolitis. Differential includes C diff colitis. 2. Stable faint patchy sclerosis in the iliac bones and lumbar vertebral bodies. No new or progressive osseous metastatic disease by CT. Please note that the osseous lesions are relatively occult by CT and would be better evaluated by PET-CT, as clinically warranted. 3. No evidence of extra osseous metastatic disease.   These results will be called to the ordering clinician or representative by the Radiologist Assistant, and communication documented in the PACS or zVision Dashboard.   04/15/2018 - 06/16/2018 Chemotherapy   Enhertu q3weeks starting 04/15/18. Stopped Enhertu after cycle 3 on 05/27/18 due to poor toleration due to poor toleration with anorexia, weight loss, N&V, diarrhea   04/21/2018 PET scan   PET 04/21/18  IMPRESSION: 1. Overall marked interval improvement in bony hypermetabolic disease. All of the previously identified hypermetabolic osseous metastases have decreased in hypermetabolism in the interval.  There is a new tiny focus of hypermetabolism in the right aspect of the L3 vertebral body that appears to correspond to a new 8 mm lucency, raising concern for new metastatic disease. Close attention on follow-up recommended. 2. Focal hypermetabolism in the central spinal canal at the level of L1 previously has resolved. 3. No hypermetabolic soft tissue disease on today's study. 4. Right thyroid nodule seen previously has decreased in the interval.     06/16/2018 Imaging   CT LUMBAR SPINE WO CONTRAST 06/16/18 IMPRESSION: 1. Transitional lumbosacral anatomy. Correlation with radiographs is recommended prior to any operative intervention. 2. Suspicion of a right L3 vertebral body metastasis on the April PET-CT is not evident by CT. Stable degenerative versus metastatic lesion at the right L5 superior endplate. Small but increased right iliac bone metastasis since March. Lumbar MRI would best characterize lumbar metastatic disease (without contrast if necessary). 3. Suspect L2-L3 left subarticular segment disc herniation. Query left L2 radiculitis. 4. Stable appearance of rightward L4-L5 disc degeneration which could affect the right foramen and right lateral recess.   06/17/2018 - 11/16/2018 Chemotherapy   Restart Taxol 2 weeks on/1 week off with Herceptin and Perjeta q3weeks on 06/17/18. Perjeta was stopped after C5 due  to diarrhea. Stopped Taxol after 11/16/18 due to disease progression to L3.    07/06/2018 PET scan   PET  IMPRESSION: 1. Near total resolution of hypermetabolic activity associated with skeletal metastasis. Very mild activity associated with the LEFT iliac bone which is similar to background blood pool activity. 2. No new or progressive disease.   11/21/2018 PET scan   IMPRESSION: 1. New focal hypermetabolic lesion eccentric to the right in the L3 vertebral body, maximum SUV 6.1 compatible with a new focus of active malignancy. 2. Very low-grade activity similar to  blood pool in previous existing mildly sclerotic metastatic lesions including the left iliac bone lesion. Some of the mildly sclerotic lesions are not hypermetabolic compatible with effectively treated bony metastatic disease. 3. No hypermetabolic soft tissue lesions are identified.     11/30/2018 - 08/25/2019 Chemotherapy   Continue Herceptin q3weeks. Stopped 08/25/19 due to significant disease progression and switched to  IV Matuzumab   12/12/2018 - 12/17/2018 Chemotherapy   Tucatinib (TUKYSA) 6 tabs BID as a next line therapy starting 12/12/18. Stopped 12/17/18 duet o N&V, poor appetite and HA    12/21/2018 - 03/17/2019 Chemotherapy   IV Eribulin on day 1, 8 every 21 days starting 12/21/18. Stopped 03/17/19 due to mixed response to treatment.    03/21/2019 PET scan   IMPRESSION: 1. Mixed response to therapy of osseous metastasis. Although a right-sided L4 lesion demonstrates decreased hypermetabolism today, new left iliac and right ischial hypermetabolic lesions are seen. 2. No evidence of hypermetabolic soft tissue metastasis.     03/27/2019 - 07/21/2019 Chemotherapy   Neratinib (Nerlynx) titrate up from $Remov'120mg'MNLGSu$  to $R'240mg'pJ$  starting 03/27/19. She could not tolerate $RemoveBefo'160mg'CCZWSaDwkqi$  and stopped on 02/05/19 due to N&V and diarrhea. Will restart at $RemoveBe'120mg'RjdvyzrCo$  on 04/10/18-04/27/19 and 4/19-4/22. Reduced to $RemoveBe'80mg'NAdNHzKPT$  once daily on 05/10/19. Held/stopped since 07/21/19 due to dry cough, Nausea and body aches.   06/23/2019 Imaging   CT Head  IMPRESSION: 1. No acute intracranial abnormality or metastatic disease identified. 2. Chronic white matter disease in the right hemisphere is stable from prior exams.     09/05/2019 PET scan   IMPRESSION: 1. Significant progression of widespread hypermetabolic osseous metastases throughout the axial skeleton as detailed. 2. No hypermetabolic extraosseous sites of metastatic disease.   09/15/2019 - 12/22/2019 Chemotherapy   -IV Margetuximab q3weeks and IV Gemcitabine 2 weeks on/1 week off  starting 09/15/19. Carboplatin added to Gemcitabine starting with C3 on 11/03/19. D/c after C5 due to disease progression in bone.    12/13/2019 Imaging   PET  IMPRESSION: 1. Interval progression of widespread, FDG avid osseous metastasis involving the axial skeleton. 2. No FDG avid extra osseous sites of metastatic disease.     01/19/2020 -  Chemotherapy   -Xeloda BID 2 weeks on/1 week off and Herceptin q3weeks starting 01/19/20. C1 at $Rem'1000mg'dkSI$  BID. C2 increased to $RemoveBefo'1500mg'FwctRCVTfEm$  in the AM and $Remo'1000mg'UUdwz$  in the PM starting 02/12/20.   04/09/2020 Imaging   CT CAP  IMPRESSION: 1. New 3 mm RIGHT upper lobe nodule, nonspecific, attention on follow-up. 2. What may represent an incomplete periprosthetic fracture about the cephalo medullary rod in the LEFT proximal femur. This linear band of incomplete bony fusion that is more pronounced than on previous CT imaging that was acquired in 2017 and may represent a stress type fracture about hardware. Correlate with any pain in this area. Sclerotic margins show chronic features but the linear band passing through the bone around the intramedullary rod is more extensive, involving approximately  270 degrees of the circumference of the femur, medial cortex remains intact. 3. Other findings of bony metastatic disease are similar to prior imaging. Sclerosis and some periosteal reaction about a nondisplaced pathologic fracture of the LEFT lateral seventh rib. 4. Small hypervascular focus in the RIGHT hepatic lobe, indistinct but new compared to previous imaging. Perhaps this represents a transient phenomenon though is not imaged on the later phase to allow for confirmation. Liver MRI may be helpful for further evaluation, to exclude underlying metastatic lesion as clinically warranted. 5. Hepatic steatosis. 6. Aortic atherosclerosis.   Aortic Atherosclerosis (ICD10-I70.0).     07/18/2020 Imaging   PET  IMPRESSION: No signs of visceral or nodal metastatic  disease.   Increased number of hypermetabolic lesions with increased intensity of many of these areas and increased "size" of affected areas based on the regions of FDG uptake. While there are areas that do show diminished FDG uptake such as in the RIGHT scapula on balance the number and size of these areas has increased.   08/02/2020 -  Chemotherapy   Patient is on Treatment Plan : BREAST Adjuvant CMF IV q21d        INTERVAL HISTORY:  [name] is here for a follow up of metastatic breast cancer. She was last seen by me on 09/20/20. She presents to the clinic accompanied by an interpreter. She reports she tolerated her last cycle of chemo much better and was able to eat. She note some mild headache She reports worsening pain to her lower extremities, from her hips down her legs. She notes the oxycodone did not help. She reports her bowel movements have been good, with her last one yesterday. I advised her to keep a stool softener or laxative on hand just in case. She notes she remains on [place] and is no longer using xanax.   All other systems were reviewed with the patient and are negative.  MEDICAL HISTORY:  Past Medical History:  Diagnosis Date   Abnormal Pap smear    Anxiety    Breast cancer ([place])    Cancer ([place]) 1999   breast-s/p mastectomy, chemo, rad   CIN I (cervical intraepithelial neoplasia I) 2003   by colpo   Congenital deafness    Deaf    Depression    Port-A-Cath in place 2017   for chemotherapy   Radiation 07/12/15-07/26/15   left femur 30 Gy   S/P bilateral oophorectomy     SURGICAL HISTORY: Past Surgical History:  Procedure Laterality Date   ABDOMINAL HYSTERECTOMY     INTRAMEDULLARY (IM) NAIL INTERTROCHANTERIC Left 06/26/2015   Procedure: LEFT BIOMET LONG AFFIXS NAIL;  Surgeon: [name], MD;  Location: [facility];  Service: Orthopedics;  Laterality: Left;   IR GENERIC HISTORICAL  08/06/2015   IR US GUIDE VASC ACCESS LEFT 08/06/2015 Aletta Edouard, MD  [facility]   IR GENERIC HISTORICAL  08/06/2015   IR FLUORO GUIDE CV LINE LEFT 08/06/2015 Aletta Edouard, MD [facility]   IR GENERIC HISTORICAL  03/05/2016   IR CV LINE INJECTION 03/05/2016 Markus Daft, MD [facility]   LEFT HEART CATH AND CORONARY ANGIOGRAPHY N/A 12/13/2017   Procedure: LEFT HEART CATH AND CORONARY ANGIOGRAPHY;  Surgeon: Leonie Man, MD;  Location: [facility];  Service: Cardiovascular;  Laterality: N/A;   MASTECTOMY     right breast   MASTECTOMY     OVARIAN CYST REMOVAL     RADIOLOGY WITH ANESTHESIA Left 06/25/2015   Procedure: MRI  OF LEFT HIP WITH OR WITHOUT CONTRAST;  Surgeon: Medication Radiologist, MD;  Location: [facility];  Service: Radiology;  Laterality: Left;  DR. MCINTYRE/MRI   TUBAL LIGATION      I have reviewed the social history and family history with the patient and they are unchanged from previous note.  ALLERGIES:  is allergic to promethazine hcl, chlorhexidine, and diphenhydramine hcl.  MEDICATIONS:  Current Outpatient Medications  Medication Sig Dispense Refill   morphine (MSIR) 15 MG tablet Take 1 tablet (15 mg total) by mouth every 6 (six) hours as needed for severe pain. 60 tablet 0   dexamethasone (DECADRON) 4 MG tablet Take 1 tablet (4 mg total) by mouth daily. For 3-5 days after chemo 30 tablet 0   diphenoxylate-atropine (LOMOTIL) 2.5-0.025 MG tablet Take 1-2 tablets by mouth 4 (four) times daily as needed for diarrhea or loose stools. 60 tablet 1   lidocaine-prilocaine (EMLA) cream Apply 1 application topically as needed. Apply to Porta-Cath 1-2 hours prior to access as directed. 90 g 2   LORazepam (ATIVAN) 0.5 MG tablet Take 1 tablet (0.5 mg total) by mouth every 8 (eight) hours as needed for anxiety (nausea). 20 tablet 0   ondansetron (ZOFRAN) 8 MG tablet Take 1 tablet (8 mg total) by mouth every 8 (eight) hours as needed for nausea or vomiting. 45 tablet 1   potassium chloride (KLOR-CON) 10 MEQ tablet Take 1 tablet (10 mEq total) by  mouth 3 (three) times daily. 90 tablet 2   prochlorperazine (COMPAZINE) 10 MG tablet TAKE 1 TABLET(10 MG) BY MOUTH EVERY 6 HOURS AS NEEDED FOR NAUSEA OR VOMITING 60 tablet 1   zolpidem (AMBIEN) 10 MG tablet Take 1 tablet (10 mg total) by mouth at bedtime as needed for sleep. 30 tablet 0   No current facility-administered medications for this visit.   Facility-Administered Medications Ordered in Other Visits  Medication Dose Route Frequency Provider Last Rate Last Admin   potassium chloride SA (K-DUR) CR tablet 40 mEq  40 mEq Oral Once Truitt Merle, MD       sodium chloride 0.9 % 1,000 mL with potassium chloride 10 mEq infusion   Intravenous Continuous Livesay, Tamala Julian, MD   Stopped at 09/10/15 1653   sodium chloride 0.9 % injection 10 mL  10 mL Intravenous PRN Livesay, Lennis P, MD        PHYSICAL EXAMINATION: ECOG PERFORMANCE STATUS: 2 - Symptomatic, <50% confined to bed  Vitals:   10/11/20 0823  BP: 125/66  Pulse: 67  Resp: 18  Temp: 97.8 F (36.6 C)  SpO2: 100%   Wt Readings from Last 3 Encounters:  10/11/20 130 lb 6 oz (59.1 kg)  09/13/20 130 lb 14.4 oz (59.4 kg)  08/23/20 130 lb 3.2 oz (59.1 kg)     GENERAL:alert, no distress and comfortable SKIN: skin color normal, no rashes or significant lesions EYES: normal, Conjunctiva are pink and non-injected, sclera clear  NEURO: alert & oriented x 3 with fluent speech  LABORATORY DATA:  I have reviewed the data as listed CBC Latest Ref Rng & Units 10/11/2020 09/20/2020 09/13/2020  WBC 4.0 - 10.5 K/uL 3.2(L) 5.0 3.9(L)  Hemoglobin 12.0 - 15.0 g/dL 9.2(L) 8.6(L) 7.8(L)  Hematocrit 36.0 - 46.0 % 27.6(L) 25.9(L) 23.8(L)  Platelets 150 - 400 K/uL 205 213 207     CMP Latest Ref Rng & Units 10/11/2020 09/20/2020 09/13/2020  Glucose 70 - 99 mg/dL 87 97 85  BUN 6 - 20 mg/dL 5(L) 14 7  Creatinine 0.44 - 1.00 mg/dL 0.85 0.99 0.88  Sodium 135 - 145 mmol/L 143 140 144  Potassium 3.5 - 5.1 mmol/L 3.6 3.3(L) 3.4(L)  Chloride 98 - 111 mmol/L 107  105 106  CO2 22 - 32 mmol/L _0 Calcium 8.9 - 10.3 mg/dL 9.3 9.0 9.3  Total Protein 6.5 - 8.1 g/dL 6.7 6.3(L) 6.4(L)  Total Bilirubin 0.3 - 1.2 mg/dL 1.1 1.1 0.8  Alkaline Phos 38 - 126 U/L 84 87 92  AST 15 - 41 U/L _1 ALT 0 - 44 U/L _2 RADIOGRAPHIC STUDIES: I have personally reviewed the radiological images as listed and agreed with the findings in the report. No results found.    Orders Placed This Encounter  Procedures   NM PET Image Restage (PS) Skull Base to Thigh (F-18 FDG)    Standing Status:   Future    Standing Expiration Date:   10/11/2021    Order Specific Question:   If indicated for the ordered procedure, I authorize the administration of a radiopharmaceutical per Radiology protocol    Answer:   Yes    Order Specific Question:   Is the patient pregnant?    Answer:   No    Order Specific Question:   Preferred imaging location?    Answer:   [facility]    Order Specific Question:   Radiology Contrast Protocol - do NOT remove file path    Answer:   [email or web address]    All questions were answered. The patient knows to call the clinic with any problems, questions or concerns. No barriers to learning was detected. The total time spent in the appointment was 30 minutes.     Truitt Merle, MD 10/11/2020   I, Wilburn Mylar, am acting as scribe for Truitt Merle, MD.   I have reviewed the above documentation for accuracy and completeness, and I agree with the above.

## 2020-10-12 LAB — CANCER ANTIGEN 27.29: CA 27.29: 171.3 U/mL — ABNORMAL HIGH (ref 0.0–38.6)

## 2020-10-18 ENCOUNTER — Ambulatory Visit: Payer: Medicaid Other

## 2020-10-18 ENCOUNTER — Other Ambulatory Visit: Payer: Medicaid Other

## 2020-10-18 ENCOUNTER — Ambulatory Visit: Payer: Medicaid Other | Admitting: Hematology

## 2020-10-22 ENCOUNTER — Encounter: Payer: Self-pay | Admitting: Hematology

## 2020-10-25 ENCOUNTER — Other Ambulatory Visit: Payer: Self-pay | Admitting: Hematology

## 2020-10-25 ENCOUNTER — Encounter: Payer: Self-pay | Admitting: Hematology

## 2020-10-25 DIAGNOSIS — G4700 Insomnia, unspecified: Secondary | ICD-10-CM

## 2020-10-25 MED ORDER — ZOLPIDEM TARTRATE 10 MG PO TABS: 10.0000 mg | ORAL_TABLET | Freq: Every evening | ORAL | 0 refills | Status: DC | PRN

## 2020-10-28 ENCOUNTER — Telehealth: Payer: Self-pay

## 2020-10-28 NOTE — Telephone Encounter (Signed)
Received phone correspondence from [employer] regarding Prior Authorization request for Morphine Sulfate 15mg  tablets. The medication does not require a prior authorization. The pharmacy must enter the appropriate Clarification Code to override the rejection. Patient notified. Patient's preferred Pharmacy notified.

## 2020-10-30 ENCOUNTER — Telehealth: Payer: Self-pay | Admitting: Hematology

## 2020-10-30 NOTE — Telephone Encounter (Signed)
Sch per 9/30 los, pt aware

## 2020-10-31 ENCOUNTER — Ambulatory Visit (HOSPITAL_COMMUNITY)
Admission: RE | Admit: 2020-10-31 | Discharge: 2020-10-31 | Disposition: A | Discharge status: Home / Self Care | Payer: Medicaid Other | Source: Ambulatory Visit | Attending: Hematology | Admitting: Hematology

## 2020-10-31 ENCOUNTER — Other Ambulatory Visit: Payer: Self-pay

## 2020-10-31 DIAGNOSIS — C50911 Malignant neoplasm of unspecified site of right female breast: Secondary | ICD-10-CM | POA: Insufficient documentation

## 2020-10-31 DIAGNOSIS — Z9011 Acquired absence of right breast and nipple: Secondary | ICD-10-CM | POA: Diagnosis not present

## 2020-10-31 DIAGNOSIS — C7951 Secondary malignant neoplasm of bone: Secondary | ICD-10-CM | POA: Insufficient documentation

## 2020-10-31 DIAGNOSIS — C50919 Malignant neoplasm of unspecified site of unspecified female breast: Secondary | ICD-10-CM | POA: Diagnosis not present

## 2020-10-31 DIAGNOSIS — R590 Localized enlarged lymph nodes: Secondary | ICD-10-CM | POA: Diagnosis not present

## 2020-10-31 DIAGNOSIS — J9859 Other diseases of mediastinum, not elsewhere classified: Secondary | ICD-10-CM | POA: Diagnosis not present

## 2020-10-31 LAB — GLUCOSE, CAPILLARY: Glucose-Capillary: 89 mg/dL (ref 70–99)

## 2020-10-31 MED ORDER — FLUDEOXYGLUCOSE F - 18 (FDG) INJECTION
6.4800 | Freq: Once | INTRAVENOUS | Status: AC
  Administered 2020-10-31: 6.48 via INTRAVENOUS

## 2020-10-31 MED FILL — Fosaprepitant Dimeglumine For IV Infusion 150 MG (Base Eq): INTRAVENOUS | Qty: 5 | Status: AC

## 2020-11-01 ENCOUNTER — Encounter: Payer: Self-pay | Admitting: General Practice

## 2020-11-01 ENCOUNTER — Inpatient Hospital Stay: Payer: Medicaid Other

## 2020-11-01 ENCOUNTER — Ambulatory Visit: Payer: Medicaid Other | Admitting: Dietician

## 2020-11-01 ENCOUNTER — Encounter: Payer: Self-pay | Admitting: Hematology

## 2020-11-01 ENCOUNTER — Inpatient Hospital Stay: Payer: Medicaid Other | Attending: Hematology

## 2020-11-01 ENCOUNTER — Inpatient Hospital Stay (HOSPITAL_BASED_OUTPATIENT_CLINIC_OR_DEPARTMENT_OTHER): Payer: Medicaid Other | Admitting: Hematology

## 2020-11-01 VITALS — BP 127/73 | HR 79 | Temp 97.6°F | Resp 17 | Wt 132.4 lb

## 2020-11-01 DIAGNOSIS — Z95828 Presence of other vascular implants and grafts: Secondary | ICD-10-CM

## 2020-11-01 DIAGNOSIS — C7951 Secondary malignant neoplasm of bone: Secondary | ICD-10-CM

## 2020-11-01 DIAGNOSIS — C50911 Malignant neoplasm of unspecified site of right female breast: Secondary | ICD-10-CM | POA: Diagnosis not present

## 2020-11-01 DIAGNOSIS — Z170 Estrogen receptor positive status [ER+]: Secondary | ICD-10-CM | POA: Insufficient documentation

## 2020-11-01 DIAGNOSIS — Z23 Encounter for immunization: Secondary | ICD-10-CM | POA: Diagnosis not present

## 2020-11-01 DIAGNOSIS — Z452 Encounter for adjustment and management of vascular access device: Secondary | ICD-10-CM | POA: Insufficient documentation

## 2020-11-01 DIAGNOSIS — H905 Unspecified sensorineural hearing loss: Secondary | ICD-10-CM | POA: Diagnosis not present

## 2020-11-01 DIAGNOSIS — Z5112 Encounter for antineoplastic immunotherapy: Secondary | ICD-10-CM | POA: Insufficient documentation

## 2020-11-01 DIAGNOSIS — G620 Drug-induced polyneuropathy: Secondary | ICD-10-CM | POA: Insufficient documentation

## 2020-11-01 DIAGNOSIS — Z5111 Encounter for antineoplastic chemotherapy: Secondary | ICD-10-CM | POA: Insufficient documentation

## 2020-11-01 LAB — CBC WITH DIFFERENTIAL (CANCER CENTER ONLY)
Abs Immature Granulocytes: 0.12 10*3/uL — ABNORMAL HIGH (ref 0.00–0.07)
Basophils Absolute: 0 10*3/uL (ref 0.0–0.1)
Basophils Relative: 1 %
Eosinophils Absolute: 0.1 10*3/uL (ref 0.0–0.5)
Eosinophils Relative: 3 %
HCT: 31.1 % — ABNORMAL LOW (ref 36.0–46.0)
Hemoglobin: 9.9 g/dL — ABNORMAL LOW (ref 12.0–15.0)
Immature Granulocytes: 4 %
Lymphocytes Relative: 16 %
Lymphs Abs: 0.5 10*3/uL — ABNORMAL LOW (ref 0.7–4.0)
MCH: 29.1 pg (ref 26.0–34.0)
MCHC: 31.8 g/dL (ref 30.0–36.0)
MCV: 91.5 fL (ref 80.0–100.0)
Monocytes Absolute: 0.4 10*3/uL (ref 0.1–1.0)
Monocytes Relative: 11 %
Neutro Abs: 2.1 10*3/uL (ref 1.7–7.7)
Neutrophils Relative %: 65 %
Platelet Count: 189 10*3/uL (ref 150–400)
RBC: 3.4 MIL/uL — ABNORMAL LOW (ref 3.87–5.11)
RDW: 14.5 % (ref 11.5–15.5)
WBC Count: 3.2 10*3/uL — ABNORMAL LOW (ref 4.0–10.5)
nRBC: 0 % (ref 0.0–0.2)

## 2020-11-01 LAB — CMP (CANCER CENTER ONLY)
ALT: 15 U/L (ref 0–44)
AST: 20 U/L (ref 15–41)
Albumin: 4 g/dL (ref 3.5–5.0)
Alkaline Phosphatase: 84 U/L (ref 38–126)
Anion gap: 8 (ref 5–15)
BUN: 8 mg/dL (ref 6–20)
CO2: 29 mmol/L (ref 22–32)
Calcium: 9.5 mg/dL (ref 8.9–10.3)
Chloride: 103 mmol/L (ref 98–111)
Creatinine: 0.93 mg/dL (ref 0.44–1.00)
GFR, Estimated: >60 mL/min (ref >60)
Glucose, Bld: 88 mg/dL (ref 70–99)
Potassium: 3.6 mmol/L (ref 3.5–5.1)
Sodium: 140 mmol/L (ref 135–145)
Total Bilirubin: 1.2 mg/dL (ref 0.3–1.2)
Total Protein: 7.4 g/dL (ref 6.5–8.1)

## 2020-11-01 LAB — VITAMIN D 25 HYDROXY (VIT D DEFICIENCY, FRACTURES): Vit D, 25-Hydroxy: 20.99 ng/mL — ABNORMAL LOW (ref 30–100)

## 2020-11-01 MED ORDER — SODIUM CHLORIDE 0.9% FLUSH
10.0000 mL | Freq: Once | INTRAVENOUS | Status: AC
  Administered 2020-11-01: 10 mL

## 2020-11-01 MED ORDER — ZOLEDRONIC ACID 4 MG/100ML IV SOLN
4.0000 mg | Freq: Once | INTRAVENOUS | Status: AC
  Administered 2020-11-01: 4 mg via INTRAVENOUS
  Filled 2020-11-01: qty 100

## 2020-11-01 MED ORDER — SODIUM CHLORIDE 0.9% FLUSH
10.0000 mL | INTRAVENOUS | Status: DC | PRN
  Administered 2020-11-01: 10 mL

## 2020-11-01 MED ORDER — TRASTUZUMAB-DKST CHEMO 150 MG IV SOLR
6.0000 mg/kg | Freq: Once | INTRAVENOUS | Status: AC
  Administered 2020-11-01: 357 mg via INTRAVENOUS
  Filled 2020-11-01: qty 17

## 2020-11-01 MED ORDER — SODIUM CHLORIDE 0.9 % IV SOLN
25.0000 mg/m2 | Freq: Once | INTRAVENOUS | Status: AC
  Administered 2020-11-01: 42 mg via INTRAVENOUS
  Filled 2020-11-01: qty 4.2

## 2020-11-01 MED ORDER — ACETAMINOPHEN 325 MG PO TABS
650.0000 mg | ORAL_TABLET | Freq: Once | ORAL | Status: AC
  Administered 2020-11-01: 650 mg via ORAL
  Filled 2020-11-01: qty 2

## 2020-11-01 MED ORDER — ALTEPLASE 2 MG IJ SOLR
2.0000 mg | Freq: Once | INTRAMUSCULAR | Status: DC
  Administered 2020-11-01: 2 mg
  Filled 2020-11-01: qty 2

## 2020-11-01 MED ORDER — SODIUM CHLORIDE 0.9 % IV SOLN
Freq: Once | INTRAVENOUS | Status: AC
Start: 2020-11-01 — End: 2020-11-01

## 2020-11-01 MED ORDER — PROCHLORPERAZINE MALEATE 10 MG PO TABS
10.0000 mg | ORAL_TABLET | Freq: Once | ORAL | Status: AC
  Administered 2020-11-01: 10 mg via ORAL
  Filled 2020-11-01: qty 1

## 2020-11-01 MED ORDER — INFLUENZA VAC SPLIT QUAD 0.5 ML IM SUSY
0.5000 mL | PREFILLED_SYRINGE | Freq: Once | INTRAMUSCULAR | Status: AC
  Administered 2020-11-01: 0.5 mL via INTRAMUSCULAR
  Filled 2020-11-01: qty 0.5

## 2020-11-01 MED ORDER — LORATADINE 10 MG PO TABS
10.0000 mg | ORAL_TABLET | Freq: Once | ORAL | Status: AC
  Administered 2020-11-01: 10 mg via ORAL
  Filled 2020-11-01: qty 1

## 2020-11-01 MED ORDER — MORPHINE SULFATE ER 15 MG PO TBCR: 15.0000 mg | EXTENDED_RELEASE_TABLET | Freq: Two times a day (BID) | ORAL | 0 refills | Status: DC

## 2020-11-01 MED ORDER — HEPARIN SOD (PORK) LOCK FLUSH 100 UNIT/ML IV SOLN
500.0000 [IU] | Freq: Once | INTRAVENOUS | Status: AC | PRN
  Administered 2020-11-01: 500 [IU]

## 2020-11-01 NOTE — Progress Notes (Signed)
Noted patient's allergy to promethazine. Patient states she tolerates prochlorperazine without any problems.

## 2020-11-01 NOTE — Patient Instructions (Signed)
[facility]   Discharge Instructions: Thank you for choosing [facility] to provide your oncology and hematology care.   If you have a lab appointment with the [facility], please go directly to the [facility] and check in at the registration area.   Wear comfortable clothing and clothing appropriate for easy access to any Portacath or PICC line.   We strive to give you quality time with your provider. You may need to reschedule your appointment if you arrive late (15 or more minutes).  Arriving late affects you and other patients whose appointments are after yours.  Also, if you miss three or more appointments without notifying the office, you may be dismissed from the clinic at the provider's discretion.      For prescription refill requests, have your pharmacy contact our office and allow 72 hours for refills to be completed.    Today you received the following chemotherapy and/or immunotherapy agents: trastuzumab and vinorelbine.       To help prevent nausea and vomiting after your treatment, we encourage you to take your nausea medication as directed.  BELOW ARE SYMPTOMS THAT SHOULD BE REPORTED IMMEDIATELY: *FEVER GREATER THAN 100.4 F (38 C) OR HIGHER *CHILLS OR SWEATING *NAUSEA AND VOMITING THAT IS NOT CONTROLLED WITH YOUR NAUSEA MEDICATION *UNUSUAL SHORTNESS OF BREATH *UNUSUAL BRUISING OR BLEEDING *URINARY PROBLEMS (pain or burning when urinating, or frequent urination) *BOWEL PROBLEMS (unusual diarrhea, constipation, pain near the anus) TENDERNESS IN MOUTH AND THROAT WITH OR WITHOUT PRESENCE OF ULCERS (sore throat, sores in mouth, or a toothache) UNUSUAL RASH, SWELLING OR PAIN  UNUSUAL VAGINAL DISCHARGE OR ITCHING   Items with * indicate a potential emergency and should be followed up as soon as possible or go to the Emergency Department if any problems should occur.  Please show the CHEMOTHERAPY ALERT CARD or IMMUNOTHERAPY  ALERT CARD at check-in to the Emergency Department and triage nurse.  Should you have questions after your visit or need to cancel or reschedule your appointment, please contact [facility]  Dept: [phone]  and follow the prompts.  Office hours are 8:00 a.m. to 4:30 p.m. Monday - Friday. Please note that voicemails left after 4:00 p.m. may not be returned until the following business day.  We are closed weekends and major holidays. You have access to a nurse at all times for urgent questions. Please call the main number to the clinic Dept: [phone] and follow the prompts.   For any non-urgent questions, you may also contact your provider using MyChart. We now offer e-Visits for anyone 31 and older to request care online for non-urgent symptoms. For details visit [email or web address].   Also download the MyChart app! Go to the app store, search "MyChart", open the app, select [facility], and log in with your MyChart username and password.  Due to Covid, a mask is required upon entering the hospital/clinic. If you do not have a mask, one will be given to you upon arrival. For doctor visits, patients may have 1 support person aged 43 or older with them. For treatment visits, patients cannot have anyone with them due to current Covid guidelines and our immunocompromised population.   Trastuzumab injection for infusion What is this medication? TRASTUZUMAB (tras TOO zoo mab) is a monoclonal antibody. It is used to treat breast cancer and stomach cancer. This medicine may be used for other purposes; ask your health care provider or pharmacist if you have  questions. COMMON BRAND NAME(S): Herceptin, Janae Bridgeman, Ontruzant, Trazimera What should I tell my care team before I take this medication? They need to know if you have any of these conditions: heart disease heart failure lung or breathing disease, like asthma an unusual or allergic reaction to  trastuzumab, benzyl alcohol, or other medications, foods, dyes, or preservatives pregnant or trying to get pregnant breast-feeding How should I use this medication? This drug is given as an infusion into a vein. It is administered in a hospital or clinic by a specially trained health care professional. Talk to your pediatrician regarding the use of this medicine in children. This medicine is not approved for use in children. Overdosage: If you think you have taken too much of this medicine contact a poison control center or emergency room at once. NOTE: This medicine is only for you. Do not share this medicine with others. What if I miss a dose? It is important not to miss a dose. Call your doctor or health care professional if you are unable to keep an appointment. What may interact with this medication? This medicine may interact with the following medications: certain types of chemotherapy, such as daunorubicin, doxorubicin, epirubicin, and idarubicin This list may not describe all possible interactions. Give your health care provider a list of all the medicines, herbs, non-prescription drugs, or dietary supplements you use. Also tell them if you smoke, drink alcohol, or use illegal drugs. Some items may interact with your medicine. What should I watch for while using this medication? Visit your doctor for checks on your progress. Report any side effects. Continue your course of treatment even though you feel ill unless your doctor tells you to stop. Call your doctor or health care professional for advice if you get a fever, chills or sore throat, or other symptoms of a cold or flu. Do not treat yourself. Try to avoid being around people who are sick. You may experience fever, chills and shaking during your first infusion. These effects are usually mild and can be treated with other medicines. Report any side effects during the infusion to your health care professional. Fever and chills usually  do not happen with later infusions. Do not become pregnant while taking this medicine or for 7 months after stopping it. Women should inform their doctor if they wish to become pregnant or think they might be pregnant. Women of child-bearing potential will need to have a negative pregnancy test before starting this medicine. There is a potential for serious side effects to an unborn child. Talk to your health care professional or pharmacist for more information. Do not breast-feed an infant while taking this medicine or for 7 months after stopping it. Women must use effective birth control with this medicine. What side effects may I notice from receiving this medication? Side effects that you should report to your doctor or health care professional as soon as possible: allergic reactions like skin rash, itching or hives, swelling of the face, lips, or tongue chest pain or palpitations cough dizziness feeling faint or lightheaded, falls fever general ill feeling or flu-like symptoms signs of worsening heart failure like breathing problems; swelling in your legs and feet unusually weak or tired Side effects that usually do not require medical attention (report to your doctor or health care professional if they continue or are bothersome): bone pain changes in taste diarrhea joint pain nausea/vomiting weight loss This list may not describe all possible side effects. Call your doctor for medical  advice about side effects. You may report side effects to FDA at 1-800-FDA-1088. Where should I keep my medication? This drug is given in a hospital or clinic and will not be stored at home. NOTE: This sheet is a summary. It may not cover all possible information. If you have questions about this medicine, talk to your doctor, pharmacist, or health care provider.  2022 Elsevier/Gold Standard (2015-12-24 14:37:52)  Vinorelbine injection What is this medication? VINORELBINE (vi NOR el been) is a  chemotherapy drug. It targets fast dividing cells, like cancer cells, and causes these cells to die. This medicine is used to treat lung cancer. This medicine may be used for other purposes; ask your health care provider or pharmacist if you have questions. COMMON BRAND NAME(S): Navelbine What should I tell my care team before I take this medication? They need to know if you have any of these conditions: blockage in your bowel liver disease low blood counts, like low white cell, platelet, or red cell counts lung or breathing disease, like asthma tingling of the fingers or toes, or other nerve disorder an unusual or allergic reaction to vinorelbine, other chemotherapy agents, other medicines, foods, dyes, or preservatives pregnant or trying to get pregnant breast-feeding How should I use this medication? This medicine is for infusion into a vein. It is given by a health care provider in a hospital or clinic setting. Talk to your pediatrician regarding the use of this medicine in children. Special care may be needed. Overdosage: If you think you have taken too much of this medicine contact a poison control center or emergency room at once. NOTE: This medicine is only for you. Do not share this medicine with others. What if I miss a dose? It is important not to miss your dose. Call your doctor or health care provider if you are unable to keep an appointment. What may interact with this medication? Do not take this medicine with any of the following medications: live virus vaccines This medicine may also interact with the following medications: certain antibiotics such as erythromycin or clarithromycin certain antivirals for HIV or hepatitis certain medicines for fungal infections such as fluconazole, ketoconazole, itraconazole, posaconazole, or voriconazole cimetidine ciprofloxacin conivaptan crizotinib cyclosporine diltiazem dronedarone fluvoxamine grapefruit  juice idelalisib imatinib nefazodone nelfinavir verapamil This list may not describe all possible interactions. Give your health care provider a list of all the medicines, herbs, non-prescription drugs, or dietary supplements you use. Also tell them if you smoke, drink alcohol, or use illegal drugs. Some items may interact with your medicine. What should I watch for while using this medication? Your condition will be monitored carefully while you are receiving this medicine. This medicine may make you feel generally unwell. This is not uncommon as chemotherapy can affect healthy cells as well as cancer cells. Report any side effects. Continue your course of treatment even though you feel ill unless your health care provider tells you to stop. Do not become pregnant while taking this medicine or for 6 months after stopping it. Women should inform their health care provider if they wish to become pregnant or think they might be pregnant. Men should not father a child while taking this medicine and for 3 months after stopping it. There is potential for serious side effects to an unborn child. Talk to your health care provider for more information. Do not breast-feed while taking this medicine or for 9 days after stopping it. This may make it more difficult to father a  child. Talk to your health care provider if you are concerned about your fertility. This medicine will cause constipation. Try to have a bowel movement at least every 2 to 3 days. If you do not have a bowel movement for 3 days, call your health care provider. This medicine may increase your risk of getting an infection. Call your health care provider for advice if you get a fever, chills, or sore throat, or other symptoms of a cold or flu. Do not treat yourself. Try to avoid being around people who are sick. Call your health care provider if you are around anyone with measles, chickenpox, or if you develop sores or blisters that do not heal  properly. You may need blood work done while you are taking this medicine. Avoid taking medicines that contain aspirin, acetaminophen, ibuprofen, naproxen, or ketoprofen unless instructed by your health care provider. These medicines may hide a fever. Be careful brushing or flossing your teeth or using a toothpick because you may get an infection or bleed more easily. If you have any dental work done, tell your dentist you are receiving this medicine. What side effects may I notice from receiving this medication? Side effects that you should report to your doctor or health care professional as soon as possible: allergic reactions like skin rash, itching or hives; swelling of the face, lips, or tongue breathing problems constipation pain, redness, or irritation at site where injected signs and symptoms of bleeding such as bloody or black, tarry stools; red or dark brown urine; spitting up blood or brown material that looks like coffee grounds; red spots on the skin; unusual bruising or bleeding from the eyes, gums, or nose signs and symptoms of infection like fever; chills; cough; sore throat; pain or trouble passing urine signs and symptoms of liver injury like dark yellow or brown urine; general ill feeling or flu-like symptoms; light-colored stools; loss of appetite; nausea; right upper belly pain; unusually weak or tired; yellowing of the eyes or skin signs and symptoms of low red blood cells or anemia such as unusually weak or tired; feeling faint or lightheaded; falls; breathing problems tingling, numbness in the hands or feet Side effects that usually do not require medical attention (report to your doctor or health care professional if they continue or are bothersome): jaw pain loss of appetite nausea, vomiting stomach pain This list may not describe all possible side effects. Call your doctor for medical advice about side effects. You may report side effects to FDA at  1-800-FDA-1088. Where should I keep my medication? This drug is given in a hospital or clinic and will not be stored at home. NOTE: This sheet is a summary. It may not cover all possible information. If you have questions about this medicine, talk to your doctor, pharmacist, or health care provider.  2022 Elsevier/Gold Standard (2018-12-28 17:40:18)  Zoledronic Acid Injection (Hypercalcemia, Oncology) What is this medication? ZOLEDRONIC ACID (ZOE le dron ik AS id) slows calcium loss from bones. It high calcium levels in the blood from some kinds of cancer. It may be used in other people at risk for bone loss. This medicine may be used for other purposes; ask your health care provider or pharmacist if you have questions. COMMON BRAND NAME(S): Zometa What should I tell my care team before I take this medication? They need to know if you have any of these conditions: cancer dehydration dental disease kidney disease liver disease low levels of calcium in the blood lung or  breathing disease (asthma) receiving steroids like dexamethasone or prednisone an unusual or allergic reaction to zoledronic acid, other medicines, foods, dyes, or preservatives pregnant or trying to get pregnant breast-feeding How should I use this medication? This drug is injected into a vein. It is given by a health care provider in a hospital or clinic setting. Talk to your health care provider about the use of this drug in children. Special care may be needed. Overdosage: If you think you have taken too much of this medicine contact a poison control center or emergency room at once. NOTE: This medicine is only for you. Do not share this medicine with others. What if I miss a dose? Keep appointments for follow-up doses. It is important not to miss your dose. Call your health care provider if you are unable to keep an appointment. What may interact with this medication? certain antibiotics given by injection NSAIDs,  medicines for pain and inflammation, like ibuprofen or naproxen some diuretics like bumetanide, furosemide teriparatide thalidomide This list may not describe all possible interactions. Give your health care provider a list of all the medicines, herbs, non-prescription drugs, or dietary supplements you use. Also tell them if you smoke, drink alcohol, or use illegal drugs. Some items may interact with your medicine. What should I watch for while using this medication? Visit your health care provider for regular checks on your progress. It may be some time before you see the benefit from this drug. Some people who take this drug have severe bone, joint, or muscle pain. This drug may also increase your risk for jaw problems or a broken thigh bone. Tell your health care provider right away if you have severe pain in your jaw, bones, joints, or muscles. Tell you health care provider if you have any pain that does not go away or that gets worse. Tell your dentist and dental surgeon that you are taking this drug. You should not have major dental surgery while on this drug. See your dentist to have a dental exam and fix any dental problems before starting this drug. Take good care of your teeth while on this drug. Make sure you see your dentist for regular follow-up appointments. You should make sure you get enough calcium and vitamin D while you are taking this drug. Discuss the foods you eat and the vitamins you take with your health care provider. Check with your health care provider if you have severe diarrhea, nausea, and vomiting, or if you sweat a lot. The loss of too much body fluid may make it dangerous for you to take this drug. You may need blood work done while you are taking this drug. Do not become pregnant while taking this drug. Women should inform their health care provider if they wish to become pregnant or think they might be pregnant. There is potential for serious harm to an unborn child.  Talk to your health care provider for more information. What side effects may I notice from receiving this medication? Side effects that you should report to your doctor or health care provider as soon as possible: allergic reactions (skin rash, itching or hives; swelling of the face, lips, or tongue) bone pain infection (fever, chills, cough, sore throat, pain or trouble passing urine) jaw pain, especially after dental work joint pain kidney injury (trouble passing urine or change in the amount of urine) low blood pressure (dizziness; feeling faint or lightheaded, falls; unusually weak or tired) low calcium levels (fast heartbeat; muscle cramps  or pain; pain, tingling, or numbness in the hands or feet; seizures) low magnesium levels (fast, irregular heartbeat; muscle cramp or pain; muscle weakness; tremors; seizures) low red blood cell counts (trouble breathing; feeling faint; lightheaded, falls; unusually weak or tired) muscle pain redness, blistering, peeling, or loosening of the skin, including inside the mouth severe diarrhea swelling of the ankles, feet, hands trouble breathing Side effects that usually do not require medical attention (report to your doctor or health care provider if they continue or are bothersome): anxious constipation coughing depressed mood eye irritation, itching, or pain fever general ill feeling or flu-like symptoms nausea pain, redness, or irritation at site where injected trouble sleeping This list may not describe all possible side effects. Call your doctor for medical advice about side effects. You may report side effects to FDA at 1-800-FDA-1088. Where should I keep my medication? This drug is given in a hospital or clinic. It will not be stored at home. NOTE: This sheet is a summary. It may not cover all possible information. If you have questions about this medicine, talk to your doctor, pharmacist, or health care provider.  2022 Elsevier/Gold  Standard (2018-10-13 09:13:00)  Influenza (Flu) Vaccine (Inactivated or Recombinant): What You Need to Know 1. Why get vaccinated? Influenza vaccine can prevent influenza (flu). Flu is a contagious disease that spreads around the [place] every year, usually between October and May. Anyone can get the flu, but it is more dangerous for some people. Infants and young children, people 54 years and older, pregnant people, and people with certain health conditions or a weakened immune system are at greatest risk of flu complications. Pneumonia, bronchitis, sinus infections, and ear infections are examples of flu-related complications. If you have a medical condition, such as heart disease, cancer, or diabetes, flu can make it worse. Flu can cause fever and chills, sore throat, muscle aches, fatigue, cough, headache, and runny or stuffy nose. Some people may have vomiting and diarrhea, though this is more common in children than adults. In an average year, thousands of people in the [place] die from flu, and many more are hospitalized. Flu vaccine prevents millions of illnesses and flu-related visits to the doctor each year. 2. Influenza vaccines CDC recommends everyone 6 months and older get vaccinated every flu season. Children 6 months through 16 years of age may need 2 doses during a single flu season. Everyone else needs only 1 dose each flu season. It takes about 2 weeks for protection to develop after vaccination. There are many flu viruses, and they are always changing. Each year a new flu vaccine is made to protect against the influenza viruses believed to be likely to cause disease in the upcoming flu season. Even when the vaccine doesn't exactly match these viruses, it may still provide some protection. Influenza vaccine does not cause flu. Influenza vaccine may be given at the same time as other vaccines. 3. Talk with your health care provider Tell your vaccination provider if the  person getting the vaccine: Has had an allergic reaction after a previous dose of influenza vaccine, or has any severe, life-threatening allergies Has ever had Guillain-Barr Syndrome (also called "GBS") In some cases, your health care provider may decide to postpone influenza vaccination until a future visit. Influenza vaccine can be administered at any time during pregnancy. People who are or will be pregnant during influenza season should receive inactivated influenza vaccine. People with minor illnesses, such as a cold, may be vaccinated. People who  are moderately or severely ill should usually wait until they recover before getting influenza vaccine. Your health care provider can give you more information. 4. Risks of a vaccine reaction Soreness, redness, and swelling where the shot is given, fever, muscle aches, and headache can happen after influenza vaccination. There may be a very small increased risk of Guillain-Barr Syndrome (GBS) after inactivated influenza vaccine (the flu shot). Young children who get the flu shot along with pneumococcal vaccine (PCV13) and/or DTaP vaccine at the same time might be slightly more likely to have a seizure caused by fever. Tell your health care provider if a child who is getting flu vaccine has ever had a seizure. People sometimes faint after medical procedures, including vaccination. Tell your provider if you feel dizzy or have vision changes or ringing in the ears. As with any medicine, there is a very remote chance of a vaccine causing a severe allergic reaction, other serious injury, or death. 5. What if there is a serious problem? An allergic reaction could occur after the vaccinated person leaves the clinic. If you see signs of a severe allergic reaction (hives, swelling of the face and throat, difficulty breathing, a fast heartbeat, dizziness, or weakness), call 9-1-1 and get the person to the nearest hospital. For other signs that concern you, call  your health care provider. Adverse reactions should be reported to the Vaccine Adverse Event Reporting System (VAERS). Your health care provider will usually file this report, or you can do it yourself. Visit the VAERS website at www.vaers.SamedayNews.es or call 906-555-9369. VAERS is only for reporting reactions, and VAERS staff members do not give medical advice. 6. The National Vaccine Injury Compensation Program The [employer] Vaccine Injury Compensation Program (VICP) is a federal program that was created to compensate people who may have been injured by certain vaccines. Claims regarding alleged injury or death due to vaccination have a time limit for filing, which may be as short as two years. Visit the VICP website at GoldCloset.com.ee or call 442-516-1412 to learn about the program and about filing a claim. 7. How can I learn more? Ask your health care provider. Call your local or state health department. Visit the website of the Food and Drug Administration (FDA) for vaccine package inserts and additional information at [employer]aderRating[employer]. Contact the Centers for Disease Control and Prevention (CDC): Call 9414807135 (1-800-CDC-INFO) or Visit CDC's website at https://gibson.com/. Vaccine Information Statement Inactivated Influenza Vaccine (08/18/2019) This information is not intended to replace advice given to you by your health care provider. Make sure you discuss any questions you have with your health care provider. Document Revised: 10/05/2019 Document Reviewed: 10/05/2019 Elsevier Patient Education  2022 [employer].

## 2020-11-01 NOTE — Progress Notes (Signed)
[facility] Spiritual Care Note  Referred by Dr Burr Medico for emotional support. Introduced Wayne in infusion with ASL interpreter's assistance. Ms Garritano reports good support from family and friends. [place] beach [place] joy, but she prefers visiting in warmer weather. (The question of what brings her joy was unexpected and moving to her.)  At this time she states that she is doing well and has no concerns.  Assured her that Chelsea is available in person, by phone, or via video visit, all with interpreting assistance, in case needs arise or circumstances change.    [place], [place], [place] Pager [phone] Voicemail [phone]

## 2020-11-01 NOTE — Progress Notes (Signed)
DISCONTINUE ON PATHWAY REGIMEN - Breast     A cycle is every 21 days:     Gemcitabine      Margetuximab-cmkb   **Always confirm dose/schedule in your pharmacy ordering system**  REASON: Disease Progression PRIOR TREATMENT: [id number]: Margetuximab 15 mg/kg D1 + Gemcitabine 800 mg/m2 D1, 8 q21 Days TREATMENT RESPONSE: Progressive Disease (PD)  START OFF PATHWAY REGIMEN - Breast   [id number]:Trastuzumab 8/6 mg/kg IV D1 + Vinorelbine 25 mg/m2 IV D1,8 q21 Days:   Cycle 1: A cycle is 21 days:     Trastuzumab-xxxx      Vinorelbine    Cycles 2 and beyond: A cycle is every 21 days:     Trastuzumab-xxxx      Vinorelbine   **Always confirm dose/schedule in your pharmacy ordering system**  Patient 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

## 2020-11-01 NOTE — Progress Notes (Signed)
[place]   [phone]) [phone] [phone]   Clinic Follow up Note   Patient Care Team: [name], [name], [name] (Inactive) as PCP - General (Physician Assistant) [name], MD as Consulting Physician (Orthopedic Surgery)  Date of Service:  11/01/2020  CHIEF COMPLAINT: f/u of metastatic breast cancer  CURRENT THERAPY:  Changing treatment to Trastuzumab D1, and Vinorelbine D1, 8 every 21 days on 11/01/20  ASSESSMENT & PLAN:  [name] is a 55 y.o. female with   1. 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 She had right mastectomy, 4 cycles of AC-T and 5 years of Tamoxifen/AI. -In 06/2015 she had breast cancer recurrence metastatic to the bone.  -she has had multiple endocrine therapy and chemotherapy, overall does not tolerate treatment well and frequently required dose reduction -She understands her cancer is not curable, and the treatment goal is to prolong her life and balance her quality of life -We switched her treatment to CMF and Keytruda on 08/02/20, and continue herceptin. Due to poor tolerance, I dropped the methotrexate and fluorouracil and delayed her third cycle to 09/20/20, so she received cytoxan, Beryle Flock, and herceptin. She tolerated much better with just some fatigue and mild headaches. -restaging PET 10/31/20 unfortunately showed siignificantly worsening disease. I reviewed the scan images with her and discussed the findings. -I discussed her overall poor prognosis, given her limited treatment options.  Her life expectancy is probably 3 to 6 months.  She again expressed wanting to continue treatment as long as possible.  I recommended next line treatment with vinorelbine, benefit and potential side effect discussed with her, especially neuropathy.  She agrees to proceed.  - I will refer her back to Dr. Sondra Come to discuss additional  palliative radiation -will continue trastuzumab  2. Symptom Management: nausea and vomiting with weight loss -with her increased bone pains, she has had more nausea and vomiting. Because of this, she has lost weight.  -she did not tolerate Boost or Ensure. I recommended other things, such as milkshakes, smoothies, or Carnation Instant Breakfast. I will have our nutritionist meet with her today to discuss other ideas.   3. Body pain, secondary to bone mets   -She is being treated with Zometa q26month. -She will continue oral calcium and Vit D and Prilosec for Acid Reflux.  -She has received palliative radiation therapy to the left femur (2017) and the right sacroiliac area (2019) under Dr. [name] -her hip and leg pain has increased recently. Restaging PET 10/31/20 showed worsening of the disease in the bones.  4. Goal of care discussion, DNR/DNI -We previously reviewed goal of care and I recommend DNR/DNI, she agreed again 07/19/20. -I again encouraged her to start setting up her living will. Her husband [name] automatically her HCPOA. She is considering changing her HCPOA to her daughter [name] whom she lives with.  -Though her care is palliative, our goal is to prolong her life. She would like to continue treatment.   5. Congenital deafness   6. Mild anemia, secondary to chemotherapy and malignancy  -her hgb decreased to 7.8 on 09/13/20.  -improved off methotrexate and fluorouraci. Her hgb has improved to 9.9 today (11/01/20).   7. 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 Mild, only in her feet. -worsened with CMF, remains mild off treatment.   8. Mild hyperbilirubinemia -likely second to chemotherapy, fluctuates  -Worsened on 03/22/20 with total bilirubin 3.2.  Increased again to 3.7 on 04/01/20. -There is subtle change in liver on her 04/09/20 CT CAP, but she declined MRI Liver for further evaluation. -resolved     PLAN:   -proceed with herceptin and first dose  vinorelbine  -nutritionist to see her in infusion -I called in Richards contin for her to use for her pain. -referral back to Dr. Sondra Come to discuss additional palliative radiation -lab and f/u in 1 week with C1D8 vinorelbine    No problem-specific Assessment & Plan notes found for this encounter.   SUMMARY OF ONCOLOGIC HISTORY: Oncology History Overview Note   Cancer Staging No matching staging information was found for the patient.     [name], right ([place])  11/1997 Cancer Diagnosis   Patient had 1.4 cm poorly differentiated right breast carcinoma diagnosed in Nov 1999 at age 41, 1/7 axillary nodes involved, ER/PR and HER 2 positive. She had mastectomy with the 7 axillary node evaluation, 4 cycles of adriamycin/ cytoxan followed by taxotere, then five years of tamoxifen thru June 2005 (no herceptin in 1999). She had bilateral oophorectomy in May 2005. She was briefly on aromatase inhibitor after tamoxifen, but discontinued this herself due to poor tolerance.   04/04/2015 Genetic Testing   Negative genetic testing on the breast/ovarian cancer pnael.  The Breast/Ovarian gene panel offered by [facility] includes sequencing and rearrangement analysis for the following 20 genes:  ATM, BARD1, BRCA1, BRCA2, BRIP1, CDH1, CHEK2, EPCAM, FANCC, MLH1, MSH2, MSH6, NBN, PALB2, PMS2, PTEN, RAD51C, RAD51D, TP53, and XRCC2.   06/26/2015 Pathology Results   Initial Path Report Bone, curettage, Left femur met, intramedullary subtroch tissue - METASTATIC ADENOCARCINOMA.  Estrogen Receptor: 95%, POSITIVE, STRONG STAINING INTENSITY Progesterone Receptor: 2%, POSITIVE, STRONG  HER2 (+), IHC 3+   06/26/2015 Surgery   Biopsy of metastatic tissue and stabilization with Affixus trochanteric nail, proximal and distal interlock of left femur by Dr. [name]    06/28/2015 Imaging   CT Chest w/ Contrast 1. Extensive right internal mammary chain lymphadenopathy consistent with metastatic breast  cancer. 2. Lytic metastases involving the posterior elements at T1 with probable nondisplaced pathologic fracture of the spinous process. 3. No other evidence of thoracic metastatic disease. 4. Trace bilateral pleural effusions with associated bibasilar atelectasis. 5. Indeterminate right thyroid nodule, not previously imaged. This could be evaluated with thyroid ultrasound as clinically warranted.   06/29/2015 -  Chemotherapy   Zometa started monthly on 06/29/15 and switched to q30month from 01/08/17   07/01/2015 Imaging   Bone Scan 1. Increased activity noted throughout the left femur. Although metastatic disease cannot be excluded, these changes are most likely secondary to prior surgery. 2. No other focal abnormalities identified to suggest metastatic disease.   07/12/2015 - 07/26/2015 Radiation Therapy   Left femur, 30 Gy in 10 fractions by Dr. [name]   07/14/2015 Initial Diagnosis   [name]   07/19/2015 Imaging   Initial PET Scan 1. Severe multifocal osseous metastatic disease, much greater than anticipated based on prior imaging. 2. Multifocal nodal metastases involving the right internal mammary, prevascular and subpectoral lymph nodes. No axillary pulmonary involvement identified. 3. No distant extra osseous metastases.   07/30/2015 - 11/29/2015 Chemotherapy   Docetaxel, Herceptin and Perjeta every 3 weeks X6 cycles, pt tolerated moderately well, restaging scan showed excellent response    09/18/2015 Imaging   CT Head w/wo Contrast 1. No acute intracranial abnormality or significant interval change. 2. Stable minimal periventricular  white matter hypoattenuation on the right. This may reflect a remote ischemic injury. 3. No evidence for metastatic disease to the brain. 4. No focal soft tissue lesion to explain the patient's tenderness.   10/30/2015 Imaging   CT Abdomen Pelvis w/ Contrast 1. Few mildly prominent fluid-filled loops of  small bowel scattered throughout the abdomen, with intraluminal fluid density within the distal colon. Given the provided history, findings are suggestive of acute enteritis/diarrheal illness. 2. No other acute intra-abdominal or pelvic process identified. 3. Widespread osseous metastatic disease, better evaluated on most recent PET-CT from 07/19/2015. No pathologic fracture or other complication. 4. 11 mm hypodensity within the left kidney. This lesion measures intermediate density, and is indeterminate. While this lesion is similar in size relative to recent studies, this is increased in size relative to prior study from 2012. Further evaluation with dedicated renal mass protocol CT and/or MRI is recommended for complete characterization.   12/2015 - 05/14/2016 Chemotherapy   Maintenance Herceptin and pejeta every 3 weeks stopped on 05/14/16 due to disease progression   12/25/2015 Imaging   Restaging PET Scan 1. Markedly improved skeletal activity, with only several faint foci of residual accentuated metabolic activity, but resolution of the vast majority of the previously extensive osseous metastatic disease. 2. Resolution of the prior right internal mammary and right prevascular lymph nodes. 3. Residual subcutaneous edema and edema along fascia planes in the left upper thigh. This remains somewhat more than I would expect for placement of an IM nail 6 weeks ago. If there is leg swelling further down, consider Doppler venous ultrasound to rule out left lower extremity DVT. I do not perceive an obvious difference in density between the pelvic and common femoral veins on the noncontrast CT data. 4. Chronic right maxillary and right sphenoid sinusitis.   01/16/2016 - 10/20/2017 Anti-estrogen oral therapy   Letrozole 2.5 mg daily. She was switched to exemestane due to disease progression on 02/08/17. Stopped on 10/20/2017 when treatment changed to chemo    03/01/2016 Miscellaneous   Patient presented to ED  following a fall; she reports she landed on her back and is now experiencing back pain. The patient was evaluated and discharge home same day with pain control.   05/01/2016 Imaging   CT CAP IMPRESSION: 1. Stable exam.  No new or progressive disease identified. 2. No evidence for residual or recurrent adenopathy within the chest. 3. Stable bone metastasis.   05/01/2016 Imaging   BONE SCAN IMPRESSION: 1. Small focal uptake involving the anterior rib ends of the left second and right fourth ribs, new since the prior bone scan. The location of this uptake is more suggestive of a traumatic or inflammatory etiology as opposed to metastatic disease. No other evidence of metastatic disease. 2. There are other areas of stable uptake which are most likely degenerative/reactive, including right shoulder and cervical spine uptake and uptake along the right femur adjacent to the ORIF hardware.   05/20/2016 Imaging   NM PET Skull to Thigh IMPRESSION: 1. There multiple metastatic foci in the bony pelvis, including some new abnormal foci compare to the prior PET-CT, compatible with mildly progressive bony metastatic disease. Also, a left T11 vertebral hypermetabolic metastatic lesion is observed, new compared to the prior exam. 2. No active extraosseous malignancy is currently identified. 3. Right anterior fourth rib healing fracture, appears be   06/04/2016 - 01/08/2017 Chemotherapy   Second line chemotherapy Kadcyla every 3 weeks started on 06/04/16 and stopped on 01/08/17 due to mixed response.  10/05/2016 Imaging   CT CAP and Bone Scan:  Bones: left intramedullary rod and left femoral head neck screw in place. In the proximal diaphysis of the left femur there is cortical thickening and irregularity. No lytic or blastic bone lesions. No fracture or vertebral endplate destruction.   No definite evidence of recurrent disease in the chest, abdomen, or pelvis.   10/28/2016 Imaging   CT A/P  IMPRESSION: No acute process demonstrated in the abdomen or pelvis. No evidence of bowel obstruction or inflammation.   11/04/2016 Imaging   DG foot complete left: IMPRESSION: No fracture or dislocation.  No soft tissue abnormality   11/23/2016 Imaging   CT RIGHT FEMUR IMPRESSION: Negative CT scan of the right thigh.  No visible metastatic disease.  CT LEFT FEMUR IMPRESSION: 1. Postsurgical changes related to prior cephalomedullary rod fixation of the left femur with linear lucency along the anterolateral proximal femoral cortex at level of the lesser trochanter, suspicious for nondisplaced fracture. 2. Slightly permeative appearance of the proximal left femoral cortex at the site of known prior osseous metastases is largely unchanged. 3. Unchanged patchy lucency and sclerosis along the anterior acetabulum, which appears to correspond with an area of faint uptake on the prior PET-CT, suspicious for osseous metastasis. These results will be called to the ordering clinician or representative by the Radiologist Assistant, and communication documented in the PACS or zVision Dashboard.       01/27/2017 Imaging   IMPRESSION: 1. Mixed response to osseous metastasis. Some hypermetabolic lesions are new and increasingly hypermetabolic, while another index lesion is decreasingly hypermetabolic. 2. No evidence of hypermetabolic soft tissue metastasis.     02/08/2017 - 10/19/2017 Antibody Plan   -Herceptin every 3 week since 02/08/17 -Oral Lanpantinib 1571m and decreased to 5058mdue to diarrhea and depression. Due to disease progression she swithced to Verzenio 15040mn 05/04/17. She did not toelrate well so she was switched to Ibrance 100m26m 05/31/17. Due to her neutropenia, Ibrance dose reduced to 75 mg daily, 3 weeks on, one-week off, stopped on 10/20/2017 due to disease progression.    04/30/2017 PET scan   IMPRESSION: 1. Primarily increase in hypermetabolism of osseous metastasis.  An isolated right acetabular lesion is decreased in hypermetabolism since the prior exam. 2. No evidence of hypermetabolic soft tissue metastasis. 3. Similar non FDG avid right-sided thyroid nodule, favoring a benign etiology. Recommend attention on follow-up.   08/30/2017 PET scan   08/30/2017 PET Scan  IMPRESSION: 1. Worsening osseous metastatic disease. 2. Focal hypermetabolism in the central spinal canal the L1 level, similar to the prior exam. Metastatic implant cannot be excluded. 3. Slight enlargement of a right thyroid nodule which now appears more cystic in character. Consider further evaluation with thyroid ultrasound. If patient is clinically hyperthyroid, consider nuclear medicine thyroid uptake and scan   08/30/2017 Progression   08/30/2017 PET Scan  IMPRESSION: 1. Worsening osseous metastatic disease. 2. Focal hypermetabolism in the central spinal canal the L1 level, similar to the prior exam. Metastatic implant cannot be excluded. 3. Slight enlargement of a right thyroid nodule which now appears more cystic in character. Consider further evaluation with thyroid ultrasound. If patient is clinically hyperthyroid, consider nuclear medicine thyroid uptake and scan   09/29/2017 - 10/11/2017 Radiation Therapy   Pt received 27 Gy in 9 fractions directed at the areas of significant uptake noted on recent bone scan the right pelvis and right proximal femur, with Dr, KinaSondra Come10/09/2017 - 04/07/2018 Chemotherapy   -  Herceptin every 3 weeks starting 02/08/17, perjeta added on 10/20/2017  -weekly Taxol started on 10/21/2017. Will reduce Taxol to 2 weeks on, 1 week off starting on 02/04/17.  Stoped due to mild disease progression   10/27/2017 Imaging   10/27/2017 CT AP IMPRESSION: 1. No acute abnormality. No evidence of bowel obstruction or acute bowel inflammation. Normal appendix. 2. Stable patchy sclerotic osseous metastases throughout the visualized skeleton. No new or  progressive metastatic disease in the abdomen or pelvis.   01/11/2018 Imaging   Whole Body Bone Scan 01/11/18  IMPRESSION: 1. Multifocal osseous metastases as described. 2. Right scapular metastasis. 3. Right superior thoracic spinous process. 4. Right sacral ala and right L5 metastases. 5. Right acetabular metastasis. 6. Right proximal femur metastasis. 7. Diffuse metastases throughout the left femur. 8. Anterior right fourth rib metastasis.   01/11/2018 Imaging   CT CAP W Contrast 01/11/18  IMPRESSION: 1. Similar-appearing osseous metastatic disease. 2. No evidence for progressed metastatic disease in the chest, abdomen or pelvis.   03/30/2018 Imaging   Bone Scan 03/30/18  IMPRESSION: Stable multifocal bony metastatic disease.   03/30/2018 Imaging    CT CAP W Contrast 03/30/18 IMPRESSION: 1. New mild contiguous wall thickening with associated mucosal hyperenhancement throughout the left colon and rectum, suggesting nonspecific infectious or inflammatory proctocolitis. Differential includes C diff colitis. 2. Stable faint patchy sclerosis in the iliac bones and lumbar vertebral bodies. No new or progressive osseous metastatic disease by CT. Please note that the osseous lesions are relatively occult by CT and would be better evaluated by PET-CT, as clinically warranted. 3. No evidence of extra osseous metastatic disease.   These results will be called to the ordering clinician or representative by the Radiologist Assistant, and communication documented in the PACS or zVision Dashboard.   04/15/2018 - 06/16/2018 Chemotherapy   Enhertu q3weeks starting 04/15/18. Stopped Enhertu after cycle 3 on 05/27/18 due to poor toleration due to poor toleration with anorexia, weight loss, N&V, diarrhea   04/21/2018 PET scan   PET 04/21/18  IMPRESSION: 1. Overall marked interval improvement in bony hypermetabolic disease. All of the previously identified hypermetabolic osseous metastases have  decreased in hypermetabolism in the interval. There is a new tiny focus of hypermetabolism in the right aspect of the L3 vertebral body that appears to correspond to a new 8 mm lucency, raising concern for new metastatic disease. Close attention on follow-up recommended. 2. Focal hypermetabolism in the central spinal canal at the level of L1 previously has resolved. 3. No hypermetabolic soft tissue disease on today's study. 4. Right thyroid nodule seen previously has decreased in the interval.     06/16/2018 Imaging   CT LUMBAR SPINE WO CONTRAST 06/16/18 IMPRESSION: 1. Transitional lumbosacral anatomy. Correlation with radiographs is recommended prior to any operative intervention. 2. Suspicion of a right L3 vertebral body metastasis on the April PET-CT is not evident by CT. Stable degenerative versus metastatic lesion at the right L5 superior endplate. Small but increased right iliac bone metastasis since March. Lumbar MRI would best characterize lumbar metastatic disease (without contrast if necessary). 3. Suspect L2-L3 left subarticular segment disc herniation. Query left L2 radiculitis. 4. Stable appearance of rightward L4-L5 disc degeneration which could affect the right foramen and right lateral recess.   06/17/2018 - 11/16/2018 Chemotherapy   Restart Taxol 2 weeks on/1 week off with Herceptin and Perjeta q3weeks on 06/17/18. Perjeta was stopped after C5 due to diarrhea. Stopped Taxol after 11/16/18 due to disease progression to L3.  07/06/2018 PET scan   PET  IMPRESSION: 1. Near total resolution of hypermetabolic activity associated with skeletal metastasis. Very mild activity associated with the LEFT iliac bone which is similar to background blood pool activity. 2. No new or progressive disease.   11/21/2018 PET scan   IMPRESSION: 1. New focal hypermetabolic lesion eccentric to the right in the L3 vertebral body, maximum SUV 6.1 compatible with a new focus of active  malignancy. 2. Very low-grade activity similar to blood pool in previous existing mildly sclerotic metastatic lesions including the left iliac bone lesion. Some of the mildly sclerotic lesions are not hypermetabolic compatible with effectively treated bony metastatic disease. 3. No hypermetabolic soft tissue lesions are identified.     11/30/2018 - 08/25/2019 Chemotherapy   Continue Herceptin q3weeks. Stopped 08/25/19 due to significant disease progression and switched to  IV Matuzumab   12/12/2018 - 12/17/2018 Chemotherapy   Tucatinib (TUKYSA) 6 tabs BID as a next line therapy starting 12/12/18. Stopped 12/17/18 duet o N&V, poor appetite and HA    12/21/2018 - 03/17/2019 Chemotherapy   IV Eribulin on day 1, 8 every 21 days starting 12/21/18. Stopped 03/17/19 due to mixed response to treatment.    03/21/2019 PET scan   IMPRESSION: 1. Mixed response to therapy of osseous metastasis. Although a right-sided L4 lesion demonstrates decreased hypermetabolism today, new left iliac and right ischial hypermetabolic lesions are seen. 2. No evidence of hypermetabolic soft tissue metastasis.     03/27/2019 - 07/21/2019 Chemotherapy   Neratinib (Nerlynx) titrate up from 174m to 2450mstarting 03/27/19. She could not tolerate 16023mnd stopped on 02/05/19 due to N&V and diarrhea. Will restart at 120m59m 04/10/18-04/27/19 and 4/19-4/22. Reduced to 80mg25me daily on 05/10/19. Held/stopped since 07/21/19 due to dry cough, Nausea and body aches.   06/23/2019 Imaging   CT Head  IMPRESSION: 1. No acute intracranial abnormality or metastatic disease identified. 2. Chronic white matter disease in the right hemisphere is stable from prior exams.     09/05/2019 PET scan   IMPRESSION: 1. Significant progression of widespread hypermetabolic osseous metastases throughout the axial skeleton as detailed. 2. No hypermetabolic extraosseous sites of metastatic disease.   09/15/2019 - 12/22/2019 Chemotherapy   -IV  Margetuximab q3weeks and IV Gemcitabine 2 weeks on/1 week off starting 09/15/19. Carboplatin added to Gemcitabine starting with C3 on 11/03/19. D/c after C5 due to disease progression in bone.    12/13/2019 Imaging   PET  IMPRESSION: 1. Interval progression of widespread, FDG avid osseous metastasis involving the axial skeleton. 2. No FDG avid extra osseous sites of metastatic disease.     01/19/2020 -  Chemotherapy   -Xeloda BID 2 weeks on/1 week off and Herceptin q3weeks starting 01/19/20. C1 at 1000mg 54m C2 increased to 1500mg i68me AM and 1000mg in63m PM starting 02/12/20.   04/09/2020 Imaging   CT CAP  IMPRESSION: 1. New 3 mm RIGHT upper lobe nodule, nonspecific, attention on follow-up. 2. What may represent an incomplete periprosthetic fracture about the cephalo medullary rod in the LEFT proximal femur. This linear band of incomplete bony fusion that is more pronounced than on previous CT imaging that was acquired in 2017 and may represent a stress type fracture about hardware. Correlate with any pain in this area. Sclerotic margins show chronic features but the linear band passing through the bone around the intramedullary rod is more extensive, involving approximately 270 degrees of the circumference of the femur, medial cortex remains intact. 3. Other findings  of bony metastatic disease are similar to prior imaging. Sclerosis and some periosteal reaction about a nondisplaced pathologic fracture of the LEFT lateral seventh rib. 4. Small hypervascular focus in the RIGHT hepatic lobe, indistinct but new compared to previous imaging. Perhaps this represents a transient phenomenon though is not imaged on the later phase to allow for confirmation. Liver MRI may be helpful for further evaluation, to exclude underlying metastatic lesion as clinically warranted. 5. Hepatic steatosis. 6. Aortic atherosclerosis.   Aortic Atherosclerosis (ICD10-I70.0).     07/18/2020 Imaging    PET  IMPRESSION: No signs of visceral or nodal metastatic disease.   Increased number of hypermetabolic lesions with increased intensity of many of these areas and increased "size" of affected areas based on the regions of FDG uptake. While there are areas that do show diminished FDG uptake such as in the RIGHT scapula on balance the number and size of these areas has increased.   08/02/2020 - 10/11/2020 Chemotherapy   Patient is on Treatment Plan : BREAST Adjuvant CMF IV q21d     10/31/2020 PET scan   IMPRESSION: Interval worsening of disease with new areas of increased metabolic activity associated with LEFT hilar and infrahilar lymph nodes and on balance worsening of hypermetabolic areas within the spine, pelvis and appendicular skeleton as discussed.   Of particular note is the marked interval worsening of disease in the RIGHT proximal femur, no current visible lesion other than subtle sclerosis is noted in this area, attention to this and other substantial load bearing areas on subsequent imaging.   No additional sites of new disease or visceral involvement.   11/01/2020 -  Chemotherapy   Patient is on Treatment Plan : BREAST METASTATIC Trastuzumab D1 + Vinorelbine D1,8 (25) q21d        INTERVAL HISTORY:  [name] is here for a follow up of metastatic breast cancer. She was last seen by me on 10/11/20. She presents to the clinic accompanied by an interpreter. She reports her pain is worsening, and she has been vomiting and having issues with bowel movements. She reports the pain is mainly to both hips (about the same on both sides) that goes down to her knees and some in her hip.   All other systems were reviewed with the patient and are negative.  MEDICAL HISTORY:  Past Medical History:  Diagnosis Date   Abnormal Pap smear    Anxiety    Breast cancer ([place])    Cancer ([place]) 1999   breast-s/p mastectomy, chemo, rad   CIN I (cervical intraepithelial neoplasia I) 2003    by colpo   Congenital deafness    Deaf    Depression    Port-A-Cath in place 2017   for chemotherapy   Radiation 07/12/15-07/26/15   left femur 30 Gy   S/P bilateral oophorectomy     SURGICAL HISTORY: Past Surgical History:  Procedure Laterality Date   ABDOMINAL HYSTERECTOMY     INTRAMEDULLARY (IM) NAIL INTERTROCHANTERIC Left 06/26/2015   Procedure: LEFT BIOMET LONG AFFIXS NAIL;  Surgeon: [name], MD;  Location: [facility];  Service: Orthopedics;  Laterality: Left;   IR GENERIC HISTORICAL  08/06/2015   IR US GUIDE VASC ACCESS LEFT 08/06/2015 Aletta Edouard, MD [facility]   IR GENERIC HISTORICAL  08/06/2015   IR FLUORO GUIDE CV LINE LEFT 08/06/2015 Aletta Edouard, MD [facility]   IR GENERIC HISTORICAL  03/05/2016   IR CV LINE INJECTION 03/05/2016 Markus Daft, MD [facility]  LEFT HEART CATH AND CORONARY ANGIOGRAPHY N/A 12/13/2017   Procedure: LEFT HEART CATH AND CORONARY ANGIOGRAPHY;  Surgeon: Leonie Man, MD;  Location: [facility];  Service: Cardiovascular;  Laterality: N/A;   MASTECTOMY     right breast   MASTECTOMY     OVARIAN CYST REMOVAL     RADIOLOGY WITH ANESTHESIA Left 06/25/2015   Procedure: MRI OF LEFT HIP WITH OR WITHOUT CONTRAST;  Surgeon: Medication Radiologist, MD;  Location: [facility];  Service: Radiology;  Laterality: Left;  DR. MCINTYRE/MRI   TUBAL LIGATION      I have reviewed the social history and family history with the patient and they are unchanged from previous note.  ALLERGIES:  is allergic to promethazine hcl, chlorhexidine, and diphenhydramine hcl.  MEDICATIONS:  Current Outpatient Medications  Medication Sig Dispense Refill   morphine (MS CONTIN) 15 MG 12 hr tablet Take 1 tablet (15 mg total) by mouth every 12 (twelve) hours. 30 tablet 0   dexamethasone (DECADRON) 4 MG tablet Take 1 tablet (4 mg total) by mouth daily. For 3-5 days after chemo 30 tablet 0   diphenoxylate-atropine (LOMOTIL) 2.5-0.025 MG tablet Take 1-2 tablets by mouth 4  (four) times daily as needed for diarrhea or loose stools. 60 tablet 1   lidocaine-prilocaine (EMLA) cream Apply 1 application topically as needed. Apply to Porta-Cath 1-2 hours prior to access as directed. 90 g 2   LORazepam (ATIVAN) 0.5 MG tablet Take 1 tablet (0.5 mg total) by mouth every 8 (eight) hours as needed for anxiety (nausea). 20 tablet 0   morphine (MSIR) 15 MG tablet Take 1 tablet (15 mg total) by mouth every 6 (six) hours as needed for severe pain. 60 tablet 0   ondansetron (ZOFRAN) 8 MG tablet Take 1 tablet (8 mg total) by mouth every 8 (eight) hours as needed for nausea or vomiting. 45 tablet 1   potassium chloride (KLOR-CON) 10 MEQ tablet Take 1 tablet (10 mEq total) by mouth 3 (three) times daily. 90 tablet 2   prochlorperazine (COMPAZINE) 10 MG tablet TAKE 1 TABLET(10 MG) BY MOUTH EVERY 6 HOURS AS NEEDED FOR NAUSEA OR VOMITING 60 tablet 1   zolpidem (AMBIEN) 10 MG tablet Take 1 tablet (10 mg total) by mouth at bedtime as needed for sleep. 30 tablet 0   No current facility-administered medications for this visit.   Facility-Administered Medications Ordered in Other Visits  Medication Dose Route Frequency Provider Last Rate Last Admin   potassium chloride SA (K-DUR) CR tablet 40 mEq  40 mEq Oral Once Truitt Merle, MD       sodium chloride 0.9 % 1,000 mL with potassium chloride 10 mEq infusion   Intravenous Continuous Livesay, Tamala Julian, MD   Stopped at 09/10/15 1653   sodium chloride 0.9 % injection 10 mL  10 mL Intravenous PRN Livesay, Lennis P, MD        PHYSICAL EXAMINATION: ECOG PERFORMANCE STATUS: 2 - Symptomatic, <50% confined to bed  Vitals:   11/01/20 0834  BP: 127/73  Pulse: 79  Resp: 17  Temp: 97.6 F (36.4 C)  SpO2: 100%   Wt Readings from Last 3 Encounters:  11/01/20 132 lb 6.4 oz (60.1 kg)  10/11/20 130 lb 6 oz (59.1 kg)  09/13/20 130 lb 14.4 oz (59.4 kg)     GENERAL:alert, no distress and comfortable SKIN: skin color normal, no rashes or significant  lesions EYES: normal, Conjunctiva are pink and non-injected, sclera clear  NEURO: alert & oriented  x 3 with fluent speech  LABORATORY DATA:  I have reviewed the data as listed CBC Latest Ref Rng & Units 11/01/2020 10/11/2020 09/20/2020  WBC 4.0 - 10.5 K/uL 3.2(L) 3.2(L) 5.0  Hemoglobin 12.0 - 15.0 g/dL 9.9(L) 9.2(L) 8.6(L)  Hematocrit 36.0 - 46.0 % 31.1(L) 27.6(L) 25.9(L)  Platelets 150 - 400 K/uL 189 205 213     CMP Latest Ref Rng & Units 11/01/2020 10/11/2020 09/20/2020  Glucose 70 - 99 mg/dL 88 87 97  BUN 6 - 20 mg/dL 8 5(L) 14  Creatinine 0.44 - 1.00 mg/dL 0.93 0.85 0.99  Sodium 135 - 145 mmol/L 140 143 140  Potassium 3.5 - 5.1 mmol/L 3.6 3.6 3.3(L)  Chloride 98 - 111 mmol/L 103 107 105  CO2 22 - 32 mmol/L _0 Calcium 8.9 - 10.3 mg/dL 9.5 9.3 9.0  Total Protein 6.5 - 8.1 g/dL 7.4 6.7 6.3(L)  Total Bilirubin 0.3 - 1.2 mg/dL 1.2 1.1 1.1  Alkaline Phos 38 - 126 U/L 84 84 87  AST 15 - 41 U/L _1 ALT 0 - 44 U/L _2 RADIOGRAPHIC STUDIES: I have personally reviewed the radiological images as listed and agreed with the findings in the report. NM PET Image Restage (PS) Skull Base to Thigh (F-18 FDG)  Result Date: 10/31/2020 CLINICAL DATA:  Subsequent treatment strategy for breast cancer follow-up in a 55 year old female. EXAM: NUCLEAR MEDICINE PET SKULL BASE TO THIGH TECHNIQUE: 6.48 mCi F-18 FDG was injected intravenously. Full-ring PET imaging was performed from the skull base to thigh after the radiotracer. CT data was obtained and used for attenuation correction and anatomic localization. Fasting blood glucose: 89 mg/dl COMPARISON:  July 18, 2020. FINDINGS: Mediastinal blood pool activity: SUV max 2.48 Liver activity: SUV max NA NECK: No hypermetabolic lymph nodes in the neck. Incidental CT findings: none CHEST: New LEFT hilar and infrahilar lymph nodes with marked increased metabolic activity (image 82/5) very small lymph node measuring approximately 6 mm with a  maximum SUV of 10.7. LEFT infrahilar uptake (image 67/4) appears to correspond to nodal tissue that measures approximately 6 mm short axis (maximum SUV of 19.6) Incidental CT findings: LEFT-sided Port-A-Cath in situ is unchanged at the caval to atrial junction. Heart size is normal without substantial pericardial effusion. Normal caliber of the thoracic aorta. Normal caliber of central pulmonary vessels. Limited assessment of cardiovascular structures given lack of intravenous contrast. Signs of RIGHT mastectomy as before with breast reconstruction. No sign of pleural effusion. No consolidation. Airways are patent. ABDOMEN/PELVIS: No abnormal hypermetabolic activity within the liver, pancreas, adrenal glands, or spleen. No hypermetabolic lymph nodes in the abdomen or pelvis. Incidental CT findings: No acute findings relative to the liver. Post cholecystectomy. Pancreas, spleen, adrenal glands and kidneys are unremarkable. No hydronephrosis. No nephrolithiasis. The urinary bladder is decompressed. No acute gastrointestinal process. Normal caliber of the abdominal aorta. Smooth contour of the IVC. No pelvic or abdominal adenopathy. SKELETON: Widespread bony metastatic disease with generalized increase in the extent of uptake particularly in lumbar and thoracic spine but also diffusely throughout the LEFT pelvis and also with more pronounced involvement of the RIGHT pelvis, ribs and scapula when compared to previous imaging. Variable SUV values are illustrated below which may indicate mixed response biased towards worsening of disease. Lesion in LEFT lateral C1 with a maximum SUV of 5.2 previously with a maximum SUV of previously 5.8. (Image 40/4) T1-T2 with increased metabolic activity maximum SUV of  11.9 as compared to 19.2 (Image 44/4) maximum SUV of 5.7 LEFT scapula as compared to 7.4. T9 with increased metabolic activity, maximum SUV of 8.3 (image 77/4) previously 10.0. T11 with marked increased metabolic activity  maximum SUV of 12.5 previously with a maximum SUV of approximately 9.5, more diffuse activity on today's study. Similar increase in metabolic activity in H03 and diffusely throughout the lumbar spine. L3 uptake with a maximum SUV of 12.5 as compared to 9.2 with more diffuse activity throughout this vertebral body. Diffuse activity throughout the LEFT sacrum and pelvis more confluent on today's study. Maximum SUV in the posterior LEFT iliac and LEFT sacrum today of 12.9 as compared to 13.6. Subjectively more confluent. More confluent metabolic activity in RIGHT proximal femur (image 178/4) 7.6. Activity encompasses the femoral neck where there was previously a small focus of activity with a maximum SUV of 4.1. Marked increased confluent activity about the bilateral pubic bones. Visually on CT imaging most findings above are not substantially changed. Incidental CT findings: none IMPRESSION: Interval worsening of disease with new areas of increased metabolic activity associated with LEFT hilar and infrahilar lymph nodes and on balance worsening of hypermetabolic areas within the spine, pelvis and appendicular skeleton as discussed. Of particular note is the marked interval worsening of disease in the RIGHT proximal femur, no current visible lesion other than subtle sclerosis is noted in this area, attention to this and other substantial load bearing areas on subsequent imaging. No additional sites of new disease or visceral involvement. Electronically Signed   By: Zetta Bills M.D.   On: 10/31/2020 12:02      Orders Placed This Encounter  Procedures   Ambulatory Referral to [facility] Nutrition    Referral Priority:   Urgent    Referral Type:   Consultation    Referral Reason:   Specialty Services Required    Number of Visits Requested:   1   All questions were answered. The patient knows to call the clinic with any problems, questions or concerns. No barriers to learning was detected. The total time spent in  the appointment was 40 minutes.     Truitt Merle, MD 11/01/2020   I, Wilburn Mylar, am acting as scribe for Truitt Merle, MD.   I have reviewed the above documentation for accuracy and completeness, and I agree with the above.

## 2020-11-01 NOTE — Progress Notes (Signed)
Patients port would not give blood return had RN administer cathflow and made lab appointment for blood draw for labs

## 2020-11-01 NOTE — Progress Notes (Signed)
Nutrition  Briefly met with patient in infusion per provider request. Per notes, patient with recurrent nausea and vomiting. Patient reports she is doing okay, she is appreciative of visit but does not feel nutrition appointment is needed at this time. Patient is drinking CIB with whole milk, she does not like Boost or Ensure supplements. Patient politely declined shake recipes offered to her today.   Weight history reviewed. Patient 132 lb 6.4 oz today increased from 130 lb 6 oz on 9/30.   9/2 - 130 lb 14.4 oz 8/12 - 130 lb 3.2 oz  7/22 - 133 lb 11.2 oz   No follow-up scheduled at this time. Patient provided contact information and encouraged to contact with nutrition questions/concerns.

## 2020-11-02 LAB — CANCER ANTIGEN 27.29: CA 27.29: 197.4 U/mL — ABNORMAL HIGH (ref 0.0–38.6)

## 2020-11-08 ENCOUNTER — Inpatient Hospital Stay: Payer: Medicaid Other

## 2020-11-08 ENCOUNTER — Other Ambulatory Visit: Payer: Self-pay

## 2020-11-08 ENCOUNTER — Encounter: Payer: Self-pay | Admitting: Hematology

## 2020-11-08 ENCOUNTER — Inpatient Hospital Stay (HOSPITAL_BASED_OUTPATIENT_CLINIC_OR_DEPARTMENT_OTHER): Payer: Medicaid Other | Admitting: Hematology

## 2020-11-08 VITALS — BP 125/70 | HR 80 | Temp 97.9°F | Resp 18

## 2020-11-08 DIAGNOSIS — C7951 Secondary malignant neoplasm of bone: Secondary | ICD-10-CM | POA: Diagnosis not present

## 2020-11-08 DIAGNOSIS — C50911 Malignant neoplasm of unspecified site of right female breast: Secondary | ICD-10-CM

## 2020-11-08 DIAGNOSIS — Z5112 Encounter for antineoplastic immunotherapy: Secondary | ICD-10-CM | POA: Diagnosis not present

## 2020-11-08 DIAGNOSIS — E876 Hypokalemia: Secondary | ICD-10-CM

## 2020-11-08 LAB — CBC WITH DIFFERENTIAL (CANCER CENTER ONLY)
Abs Immature Granulocytes: 0.03 10*3/uL (ref 0.00–0.07)
Basophils Absolute: 0 10*3/uL (ref 0.0–0.1)
Basophils Relative: 1 %
Eosinophils Absolute: 0.1 10*3/uL (ref 0.0–0.5)
Eosinophils Relative: 2 %
HCT: 23.6 % — ABNORMAL LOW (ref 36.0–46.0)
Hemoglobin: 8 g/dL — ABNORMAL LOW (ref 12.0–15.0)
Immature Granulocytes: 1 %
Lymphocytes Relative: 13 %
Lymphs Abs: 0.5 10*3/uL — ABNORMAL LOW (ref 0.7–4.0)
MCH: 29.7 pg (ref 26.0–34.0)
MCHC: 33.9 g/dL (ref 30.0–36.0)
MCV: 87.7 fL (ref 80.0–100.0)
Monocytes Absolute: 0.2 10*3/uL (ref 0.1–1.0)
Monocytes Relative: 5 %
Neutro Abs: 3 10*3/uL (ref 1.7–7.7)
Neutrophils Relative %: 78 %
Platelet Count: 138 10*3/uL — ABNORMAL LOW (ref 150–400)
RBC: 2.69 MIL/uL — ABNORMAL LOW (ref 3.87–5.11)
RDW: 13.7 % (ref 11.5–15.5)
WBC Count: 3.8 10*3/uL — ABNORMAL LOW (ref 4.0–10.5)
nRBC: 0 % (ref 0.0–0.2)

## 2020-11-08 LAB — CMP (CANCER CENTER ONLY)
ALT: 13 U/L (ref 0–44)
AST: 19 U/L (ref 15–41)
Albumin: 3.4 g/dL — ABNORMAL LOW (ref 3.5–5.0)
Alkaline Phosphatase: 84 U/L (ref 38–126)
Anion gap: 11 (ref 5–15)
BUN: 6 mg/dL (ref 6–20)
CO2: 24 mmol/L (ref 22–32)
Calcium: 9 mg/dL (ref 8.9–10.3)
Chloride: 106 mmol/L (ref 98–111)
Creatinine: 0.81 mg/dL (ref 0.44–1.00)
GFR, Estimated: >60 mL/min (ref >60)
Glucose, Bld: 100 mg/dL — ABNORMAL HIGH (ref 70–99)
Potassium: 3.1 mmol/L — ABNORMAL LOW (ref 3.5–5.1)
Sodium: 141 mmol/L (ref 135–145)
Total Bilirubin: 1.2 mg/dL (ref 0.3–1.2)
Total Protein: 6.2 g/dL — ABNORMAL LOW (ref 6.5–8.1)

## 2020-11-08 MED ORDER — HYDROMORPHONE HCL 2 MG PO TABS: 2.0000 mg | ORAL_TABLET | ORAL | 0 refills | Status: DC | PRN

## 2020-11-08 MED ORDER — HEPARIN SOD (PORK) LOCK FLUSH 100 UNIT/ML IV SOLN
500.0000 [IU] | Freq: Once | INTRAVENOUS | Status: AC | PRN
  Administered 2020-11-08: 500 [IU]

## 2020-11-08 MED ORDER — SODIUM CHLORIDE 0.9% FLUSH
10.0000 mL | INTRAVENOUS | Status: DC | PRN
Start: 2020-11-08 — End: 2020-11-08
  Administered 2020-11-08: 10 mL

## 2020-11-08 MED ORDER — PROCHLORPERAZINE MALEATE 10 MG PO TABS
10.0000 mg | ORAL_TABLET | Freq: Once | ORAL | Status: AC
  Administered 2020-11-08: 10 mg via ORAL
  Filled 2020-11-08: qty 1

## 2020-11-08 MED ORDER — MORPHINE SULFATE (PF) 4 MG/ML IV SOLN
4.0000 mg | Freq: Once | INTRAVENOUS | Status: AC
  Administered 2020-11-08: 4 mg via INTRAVENOUS
  Filled 2020-11-08: qty 1

## 2020-11-08 MED ORDER — SODIUM CHLORIDE 0.9 % IV SOLN: Freq: Once | INTRAVENOUS | Status: AC

## 2020-11-08 MED ORDER — POTASSIUM CHLORIDE 10 MEQ/100ML IV SOLN
10.0000 meq | INTRAVENOUS | Status: AC
  Administered 2020-11-08: 10 meq via INTRAVENOUS
  Filled 2020-11-08: qty 100

## 2020-11-08 MED ORDER — MORPHINE SULFATE (PF) 4 MG/ML IV SOLN: 4.0000 mg | Freq: Once | INTRAVENOUS | Status: DC

## 2020-11-08 MED ORDER — VINORELBINE TARTRATE CHEMO INJECTION 50 MG/5ML
24.0000 mg/m2 | Freq: Once | INTRAVENOUS | Status: AC
  Administered 2020-11-08: 40 mg via INTRAVENOUS
  Filled 2020-11-08: qty 4

## 2020-11-08 MED ORDER — POTASSIUM CHLORIDE 10 MEQ/100ML IV SOLN: 10.0000 meq | Freq: Once | INTRAVENOUS | Status: DC

## 2020-11-08 NOTE — Progress Notes (Signed)
[facility]   [phone] [phone]   Clinic Follow up Note   Patient Care Team: [name], [name], [name] (Inactive) as PCP - General (Physician Assistant) [name], MD as Consulting Physician (Orthopedic Surgery)  Date of Service:  11/08/2020  CHIEF COMPLAINT: f/u of metastatic breast cancer  CURRENT THERAPY:  Trastuzumab D1, and Vinorelbine D1 and 8 every 21 days, starting 11/01/20  ASSESSMENT & PLAN:  [name] is a 55 y.o. female with   1. 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 She had right mastectomy, 4 cycles of AC-T and 5 years of Tamoxifen/AI. -In 06/2015 she had breast cancer recurrence metastatic to the bone.  -she has had multiple endocrine therapy and chemotherapy, overall does not tolerate treatment well and frequently required dose reduction -She understands her cancer is not curable, and the treatment goal is to prolong her life and balance her quality of life -We switched her treatment to CMF and Keytruda on 08/02/20, and continue herceptin. Due to poor tolerance, I dropped the methotrexate and fluorouracil and delayed her third cycle to 09/20/20, so she received cytoxan, Beryle Flock, and herceptin. She tolerated much better with just some fatigue and mild headaches. -restaging PET 10/31/20 unfortunately showed siignificantly worsening disease. -she was switched to trastuzumab and vinorelbine on 11/01/20. - I will refer her back to Dr. Sondra Come to discuss additional palliative radiation for her pain. Plan to hold chemo during RT  -she will receive vinorelbine, iv morphine, and iv potassium today.   2. Symptom Management: nausea and vomiting with weight loss -with her increased bone pains, she has had more nausea and vomiting. Because of this, she has lost weight.  -she did not tolerate Boost or Ensure. I recommended other  things, such as milkshakes, smoothies, or Carnation Instant Breakfast. I will have our nutritionist meet with her today to discuss other ideas. -she reports the compazine is helpful for her nausea.   3. Body pain, secondary to bone mets   -She is being treated with Zometa q44months. -She will continue oral calcium and Vit D and Prilosec for Acid Reflux.  -She has received palliative radiation therapy to the left femur (2017) and the right sacroiliac area (2019) under Dr. Sondra Come. -her hip and leg pain has increased recently. Restaging PET 10/31/20 showed worsening of the disease in the bones. -she has pain despite the oral morphine prescription. I will give her IV morphine today and call in dilaudid for her to try instead. Will also contact her pharmacy about MS contin which I prescribed last time  -I will also have her meet with our palliative care/symptom management team next week to help manage her pain medications.   4. Goal of care discussion, DNR/DNI -We previously reviewed goal of care and I recommend DNR/DNI, she agreed again 07/19/20. -I again encouraged her to start setting up her living will. Her husband [name] is automatically her HCPOA. She is considering changing her HCPOA to her daughter [name], whom she lives with.  -Though her care is palliative, our goal is to prolong her life. She would like to continue treatment.   5. Congenital deafness   6. Mild anemia, secondary to chemotherapy and malignancy  -her hgb decreased to 7.8 on 09/13/20.  -improved off methotrexate and fluorouraci.  -her hgb has gone back down from the trastuzumab/vinorelbine, to 8 today (11/08/20). Will recheck next cycle to see if  she needs blood transfusion.   7. 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 Mild, only in her feet. -worsened with CMF, remains mild off treatment.     PLAN:   -proceed with C1D8 vinorelbine, add iv morphine and potassium -we will check with her pharmacy about MS contin prescribed last  week. I called in dilaudid today  -I will reach out to Dr. Sondra Come to discuss additional palliative radiation -lab and f/u in 2 week for C2   No problem-specific Assessment & Plan notes found for this encounter.   SUMMARY OF ONCOLOGIC HISTORY: Oncology History Overview Note   Cancer Staging No matching staging information was found for the patient.     [name], right ([place])  11/1997 Cancer Diagnosis   Patient had 1.4 cm poorly differentiated right breast carcinoma diagnosed in Nov 1999 at age 8, 1/7 axillary nodes involved, ER/PR and HER 2 positive. She had mastectomy with the 7 axillary node evaluation, 4 cycles of adriamycin/ cytoxan followed by taxotere, then five years of tamoxifen thru June 2005 (no herceptin in 1999). She had bilateral oophorectomy in May 2005. She was briefly on aromatase inhibitor after tamoxifen, but discontinued this herself due to poor tolerance.   04/04/2015 Genetic Testing   Negative genetic testing on the breast/ovarian cancer pnael.  The Breast/Ovarian gene panel offered by [facility] includes sequencing and rearrangement analysis for the following 20 genes:  ATM, BARD1, BRCA1, BRCA2, BRIP1, CDH1, CHEK2, EPCAM, FANCC, MLH1, MSH2, MSH6, NBN, PALB2, PMS2, PTEN, RAD51C, RAD51D, TP53, and XRCC2.   06/26/2015 Pathology Results   Initial Path Report Bone, curettage, Left femur met, intramedullary subtroch tissue - METASTATIC ADENOCARCINOMA.  Estrogen Receptor: 95%, POSITIVE, STRONG STAINING INTENSITY Progesterone Receptor: 2%, POSITIVE, STRONG  HER2 (+), IHC 3+   06/26/2015 Surgery   Biopsy of metastatic tissue and stabilization with Affixus trochanteric nail, proximal and distal interlock of left femur by Dr. [name]    06/28/2015 Imaging   CT Chest w/ Contrast 1. Extensive right internal mammary chain lymphadenopathy consistent with metastatic breast cancer. 2. Lytic metastases involving the posterior elements at T1 with probable  nondisplaced pathologic fracture of the spinous process. 3. No other evidence of thoracic metastatic disease. 4. Trace bilateral pleural effusions with associated bibasilar atelectasis. 5. Indeterminate right thyroid nodule, not previously imaged. This could be evaluated with thyroid ultrasound as clinically warranted.   06/29/2015 -  Chemotherapy   Zometa started monthly on 06/29/15 and switched to q36months from 01/08/17   07/01/2015 Imaging   Bone Scan 1. Increased activity noted throughout the left femur. Although metastatic disease cannot be excluded, these changes are most likely secondary to prior surgery. 2. No other focal abnormalities identified to suggest metastatic disease.   07/12/2015 - 07/26/2015 Radiation Therapy   Left femur, 30 Gy in 10 fractions by Dr. Sondra Come    07/14/2015 Initial Diagnosis   [name], right ([place])   07/19/2015 Imaging   Initial PET Scan 1. Severe multifocal osseous metastatic disease, much greater than anticipated based on prior imaging. 2. Multifocal nodal metastases involving the right internal mammary, prevascular and subpectoral lymph nodes. No axillary pulmonary involvement identified. 3. No distant extra osseous metastases.   07/30/2015 - 11/29/2015 Chemotherapy   Docetaxel, Herceptin and Perjeta every 3 weeks X6 cycles, pt tolerated moderately well, restaging scan showed excellent response    09/18/2015 Imaging   CT Head w/wo Contrast 1. No acute intracranial abnormality or significant interval change. 2. Stable minimal periventricular white  matter hypoattenuation on the right. This may reflect a remote ischemic injury. 3. No evidence for metastatic disease to the brain. 4. No focal soft tissue lesion to explain the patient's tenderness.   10/30/2015 Imaging   CT Abdomen Pelvis w/ Contrast 1. Few mildly prominent fluid-filled loops of small bowel scattered throughout the abdomen, with intraluminal fluid density within  the distal colon. Given the provided history, findings are suggestive of acute enteritis/diarrheal illness. 2. No other acute intra-abdominal or pelvic process identified. 3. Widespread osseous metastatic disease, better evaluated on most recent PET-CT from 07/19/2015. No pathologic fracture or other complication. 4. 11 mm hypodensity within the left kidney. This lesion measures intermediate density, and is indeterminate. While this lesion is similar in size relative to recent studies, this is increased in size relative to prior study from 2012. Further evaluation with dedicated renal mass protocol CT and/or MRI is recommended for complete characterization.   12/2015 - 05/14/2016 Chemotherapy   Maintenance Herceptin and pejeta every 3 weeks stopped on 05/14/16 due to disease progression   12/25/2015 Imaging   Restaging PET Scan 1. Markedly improved skeletal activity, with only several faint foci of residual accentuated metabolic activity, but resolution of the vast majority of the previously extensive osseous metastatic disease. 2. Resolution of the prior right internal mammary and right prevascular lymph nodes. 3. Residual subcutaneous edema and edema along fascia planes in the left upper thigh. This remains somewhat more than I would expect for placement of an IM nail 6 weeks ago. If there is leg swelling further down, consider Doppler venous ultrasound to rule out left lower extremity DVT. I do not perceive an obvious difference in density between the pelvic and common femoral veins on the noncontrast CT data. 4. Chronic right maxillary and right sphenoid sinusitis.   01/16/2016 - 10/20/2017 Anti-estrogen oral therapy   Letrozole 2.5 mg daily. She was switched to exemestane due to disease progression on 02/08/17. Stopped on 10/20/2017 when treatment changed to chemo    03/01/2016 Miscellaneous   Patient presented to ED following a fall; she reports she landed on her back and is now experiencing back  pain. The patient was evaluated and discharge home same day with pain control.   05/01/2016 Imaging   CT CAP IMPRESSION: 1. Stable exam.  No new or progressive disease identified. 2. No evidence for residual or recurrent adenopathy within the chest. 3. Stable bone metastasis.   05/01/2016 Imaging   BONE SCAN IMPRESSION: 1. Small focal uptake involving the anterior rib ends of the left second and right fourth ribs, new since the prior bone scan. The location of this uptake is more suggestive of a traumatic or inflammatory etiology as opposed to metastatic disease. No other evidence of metastatic disease. 2. There are other areas of stable uptake which are most likely degenerative/reactive, including right shoulder and cervical spine uptake and uptake along the right femur adjacent to the ORIF hardware.   05/20/2016 Imaging   NM PET Skull to Thigh IMPRESSION: 1. There multiple metastatic foci in the bony pelvis, including some new abnormal foci compare to the prior PET-CT, compatible with mildly progressive bony metastatic disease. Also, a left T11 vertebral hypermetabolic metastatic lesion is observed, new compared to the prior exam. 2. No active extraosseous malignancy is currently identified. 3. Right anterior fourth rib healing fracture, appears be   06/04/2016 - 01/08/2017 Chemotherapy   Second line chemotherapy Kadcyla every 3 weeks started on 06/04/16 and stopped on 01/08/17 due to mixed response.  10/05/2016 Imaging   CT CAP and Bone Scan:  Bones: left intramedullary rod and left femoral head neck screw in place. In the proximal diaphysis of the left femur there is cortical thickening and irregularity. No lytic or blastic bone lesions. No fracture or vertebral endplate destruction.   No definite evidence of recurrent disease in the chest, abdomen, or pelvis.   10/28/2016 Imaging   CT A/P IMPRESSION: No acute process demonstrated in the abdomen or pelvis. No  evidence of bowel obstruction or inflammation.   11/04/2016 Imaging   DG foot complete left: IMPRESSION: No fracture or dislocation.  No soft tissue abnormality   11/23/2016 Imaging   CT RIGHT FEMUR IMPRESSION: Negative CT scan of the right thigh.  No visible metastatic disease.  CT LEFT FEMUR IMPRESSION: 1. Postsurgical changes related to prior cephalomedullary rod fixation of the left femur with linear lucency along the anterolateral proximal femoral cortex at level of the lesser trochanter, suspicious for nondisplaced fracture. 2. Slightly permeative appearance of the proximal left femoral cortex at the site of known prior osseous metastases is largely unchanged. 3. Unchanged patchy lucency and sclerosis along the anterior acetabulum, which appears to correspond with an area of faint uptake on the prior PET-CT, suspicious for osseous metastasis. These results will be called to the ordering clinician or representative by the Radiologist Assistant, and communication documented in the PACS or zVision Dashboard.       01/27/2017 Imaging   IMPRESSION: 1. Mixed response to osseous metastasis. Some hypermetabolic lesions are new and increasingly hypermetabolic, while another index lesion is decreasingly hypermetabolic. 2. No evidence of hypermetabolic soft tissue metastasis.     02/08/2017 - 10/19/2017 Antibody Plan   -Herceptin every 3 week since 02/08/17 -Oral Lanpantinib 1544m and decreased to 5044mdue to diarrhea and depression. Due to disease progression she swithced to Verzenio 15076mn 05/04/17. She did not toelrate well so she was switched to Ibrance 100m76m 05/31/17. Due to her neutropenia, Ibrance dose reduced to 75 mg daily, 3 weeks on, one-week off, stopped on 10/20/2017 due to disease progression.    04/30/2017 PET scan   IMPRESSION: 1. Primarily increase in hypermetabolism of osseous metastasis. An isolated right acetabular lesion is decreased in  hypermetabolism since the prior exam. 2. No evidence of hypermetabolic soft tissue metastasis. 3. Similar non FDG avid right-sided thyroid nodule, favoring a benign etiology. Recommend attention on follow-up.   08/30/2017 PET scan   08/30/2017 PET Scan  IMPRESSION: 1. Worsening osseous metastatic disease. 2. Focal hypermetabolism in the central spinal canal the L1 level, similar to the prior exam. Metastatic implant cannot be excluded. 3. Slight enlargement of a right thyroid nodule which now appears more cystic in character. Consider further evaluation with thyroid ultrasound. If patient is clinically hyperthyroid, consider nuclear medicine thyroid uptake and scan   08/30/2017 Progression   08/30/2017 PET Scan  IMPRESSION: 1. Worsening osseous metastatic disease. 2. Focal hypermetabolism in the central spinal canal the L1 level, similar to the prior exam. Metastatic implant cannot be excluded. 3. Slight enlargement of a right thyroid nodule which now appears more cystic in character. Consider further evaluation with thyroid ultrasound. If patient is clinically hyperthyroid, consider nuclear medicine thyroid uptake and scan   09/29/2017 - 10/11/2017 Radiation Therapy   Pt received 27 Gy in 9 fractions directed at the areas of significant uptake noted on recent bone scan the right pelvis and right proximal femur, with Dr, KinaSondra Come10/09/2017 - 04/07/2018 Chemotherapy   -  Herceptin every 3 weeks starting 02/08/17, perjeta added on 10/20/2017  -weekly Taxol started on 10/21/2017. Will reduce Taxol to 2 weeks on, 1 week off starting on 02/04/17.  Stoped due to mild disease progression   10/27/2017 Imaging   10/27/2017 CT AP IMPRESSION: 1. No acute abnormality. No evidence of bowel obstruction or acute bowel inflammation. Normal appendix. 2. Stable patchy sclerotic osseous metastases throughout the visualized skeleton. No new or progressive metastatic disease in the abdomen or pelvis.    01/11/2018 Imaging   Whole Body Bone Scan 01/11/18  IMPRESSION: 1. Multifocal osseous metastases as described. 2. Right scapular metastasis. 3. Right superior thoracic spinous process. 4. Right sacral ala and right L5 metastases. 5. Right acetabular metastasis. 6. Right proximal femur metastasis. 7. Diffuse metastases throughout the left femur. 8. Anterior right fourth rib metastasis.   01/11/2018 Imaging   CT CAP W Contrast 01/11/18  IMPRESSION: 1. Similar-appearing osseous metastatic disease. 2. No evidence for progressed metastatic disease in the chest, abdomen or pelvis.   03/30/2018 Imaging   Bone Scan 03/30/18  IMPRESSION: Stable multifocal bony metastatic disease.   03/30/2018 Imaging    CT CAP W Contrast 03/30/18 IMPRESSION: 1. New mild contiguous wall thickening with associated mucosal hyperenhancement throughout the left colon and rectum, suggesting nonspecific infectious or inflammatory proctocolitis. Differential includes C diff colitis. 2. Stable faint patchy sclerosis in the iliac bones and lumbar vertebral bodies. No new or progressive osseous metastatic disease by CT. Please note that the osseous lesions are relatively occult by CT and would be better evaluated by PET-CT, as clinically warranted. 3. No evidence of extra osseous metastatic disease.   These results will be called to the ordering clinician or representative by the Radiologist Assistant, and communication documented in the PACS or zVision Dashboard.   04/15/2018 - 06/16/2018 Chemotherapy   Enhertu q3weeks starting 04/15/18. Stopped Enhertu after cycle 3 on 05/27/18 due to poor toleration due to poor toleration with anorexia, weight loss, N&V, diarrhea   04/21/2018 PET scan   PET 04/21/18  IMPRESSION: 1. Overall marked interval improvement in bony hypermetabolic disease. All of the previously identified hypermetabolic osseous metastases have decreased in hypermetabolism in the interval. There is a new  tiny focus of hypermetabolism in the right aspect of the L3 vertebral body that appears to correspond to a new 8 mm lucency, raising concern for new metastatic disease. Close attention on follow-up recommended. 2. Focal hypermetabolism in the central spinal canal at the level of L1 previously has resolved. 3. No hypermetabolic soft tissue disease on today's study. 4. Right thyroid nodule seen previously has decreased in the interval.     06/16/2018 Imaging   CT LUMBAR SPINE WO CONTRAST 06/16/18 IMPRESSION: 1. Transitional lumbosacral anatomy. Correlation with radiographs is recommended prior to any operative intervention. 2. Suspicion of a right L3 vertebral body metastasis on the April PET-CT is not evident by CT. Stable degenerative versus metastatic lesion at the right L5 superior endplate. Small but increased right iliac bone metastasis since March. Lumbar MRI would best characterize lumbar metastatic disease (without contrast if necessary). 3. Suspect L2-L3 left subarticular segment disc herniation. Query left L2 radiculitis. 4. Stable appearance of rightward L4-L5 disc degeneration which could affect the right foramen and right lateral recess.   06/17/2018 - 11/16/2018 Chemotherapy   Restart Taxol 2 weeks on/1 week off with Herceptin and Perjeta q3weeks on 06/17/18. Perjeta was stopped after C5 due to diarrhea. Stopped Taxol after 11/16/18 due to disease progression to L3.  07/06/2018 PET scan   PET  IMPRESSION: 1. Near total resolution of hypermetabolic activity associated with skeletal metastasis. Very mild activity associated with the LEFT iliac bone which is similar to background blood pool activity. 2. No new or progressive disease.   11/21/2018 PET scan   IMPRESSION: 1. New focal hypermetabolic lesion eccentric to the right in the L3 vertebral body, maximum SUV 6.1 compatible with a new focus of active malignancy. 2. Very low-grade activity similar to blood pool in  previous existing mildly sclerotic metastatic lesions including the left iliac bone lesion. Some of the mildly sclerotic lesions are not hypermetabolic compatible with effectively treated bony metastatic disease. 3. No hypermetabolic soft tissue lesions are identified.     11/30/2018 - 08/25/2019 Chemotherapy   Continue Herceptin q3weeks. Stopped 08/25/19 due to significant disease progression and switched to  IV Matuzumab   12/12/2018 - 12/17/2018 Chemotherapy   Tucatinib (TUKYSA) 6 tabs BID as a next line therapy starting 12/12/18. Stopped 12/17/18 duet o N&V, poor appetite and HA    12/21/2018 - 03/17/2019 Chemotherapy   IV Eribulin on day 1, 8 every 21 days starting 12/21/18. Stopped 03/17/19 due to mixed response to treatment.    03/21/2019 PET scan   IMPRESSION: 1. Mixed response to therapy of osseous metastasis. Although a right-sided L4 lesion demonstrates decreased hypermetabolism today, new left iliac and right ischial hypermetabolic lesions are seen. 2. No evidence of hypermetabolic soft tissue metastasis.     03/27/2019 - 07/21/2019 Chemotherapy   Neratinib (Nerlynx) titrate up from $Remov'120mg'oxkcWh$  to $R'240mg'KV$  starting 03/27/19. She could not tolerate $RemoveBefo'160mg'fMqOZwOmobc$  and stopped on 02/05/19 due to N&V and diarrhea. Will restart at $RemoveBe'120mg'hdmbgQgxS$  on 04/10/18-04/27/19 and 4/19-4/22. Reduced to $RemoveBe'80mg'EJEWisTaE$  once daily on 05/10/19. Held/stopped since 07/21/19 due to dry cough, Nausea and body aches.   06/23/2019 Imaging   CT Head  IMPRESSION: 1. No acute intracranial abnormality or metastatic disease identified. 2. Chronic white matter disease in the right hemisphere is stable from prior exams.     09/05/2019 PET scan   IMPRESSION: 1. Significant progression of widespread hypermetabolic osseous metastases throughout the axial skeleton as detailed. 2. No hypermetabolic extraosseous sites of metastatic disease.   09/15/2019 - 12/22/2019 Chemotherapy   -IV Margetuximab q3weeks and IV Gemcitabine 2 weeks on/1 week off starting  09/15/19. Carboplatin added to Gemcitabine starting with C3 on 11/03/19. D/c after C5 due to disease progression in bone.    12/13/2019 Imaging   PET  IMPRESSION: 1. Interval progression of widespread, FDG avid osseous metastasis involving the axial skeleton. 2. No FDG avid extra osseous sites of metastatic disease.     01/19/2020 -  Chemotherapy   -Xeloda BID 2 weeks on/1 week off and Herceptin q3weeks starting 01/19/20. C1 at $Rem'1000mg'hkxT$  BID. C2 increased to $RemoveBefo'1500mg'fGfGBGjqoxn$  in the AM and $Remo'1000mg'NjQbb$  in the PM starting 02/12/20.   04/09/2020 Imaging   CT CAP  IMPRESSION: 1. New 3 mm RIGHT upper lobe nodule, nonspecific, attention on follow-up. 2. What may represent an incomplete periprosthetic fracture about the cephalo medullary rod in the LEFT proximal femur. This linear band of incomplete bony fusion that is more pronounced than on previous CT imaging that was acquired in 2017 and may represent a stress type fracture about hardware. Correlate with any pain in this area. Sclerotic margins show chronic features but the linear band passing through the bone around the intramedullary rod is more extensive, involving approximately 270 degrees of the circumference of the femur, medial cortex remains intact. 3. Other findings  of bony metastatic disease are similar to prior imaging. Sclerosis and some periosteal reaction about a nondisplaced pathologic fracture of the LEFT lateral seventh rib. 4. Small hypervascular focus in the RIGHT hepatic lobe, indistinct but new compared to previous imaging. Perhaps this represents a transient phenomenon though is not imaged on the later phase to allow for confirmation. Liver MRI may be helpful for further evaluation, to exclude underlying metastatic lesion as clinically warranted. 5. Hepatic steatosis. 6. Aortic atherosclerosis.   Aortic Atherosclerosis (ICD10-I70.0).     07/18/2020 Imaging   PET  IMPRESSION: No signs of visceral or nodal metastatic disease.    Increased number of hypermetabolic lesions with increased intensity of many of these areas and increased "size" of affected areas based on the regions of FDG uptake. While there are areas that do show diminished FDG uptake such as in the RIGHT scapula on balance the number and size of these areas has increased.   08/02/2020 - 10/11/2020 Chemotherapy   Patient is on Treatment Plan : BREAST Adjuvant CMF IV q21d     10/31/2020 PET scan   IMPRESSION: Interval worsening of disease with new areas of increased metabolic activity associated with LEFT hilar and infrahilar lymph nodes and on balance worsening of hypermetabolic areas within the spine, pelvis and appendicular skeleton as discussed.   Of particular note is the marked interval worsening of disease in the RIGHT proximal femur, no current visible lesion other than subtle sclerosis is noted in this area, attention to this and other substantial load bearing areas on subsequent imaging.   No additional sites of new disease or visceral involvement.   11/01/2020 -  Chemotherapy   Patient is on Treatment Plan : BREAST METASTATIC Trastuzumab D1 + Vinorelbine D1,8 (25) q21d        INTERVAL HISTORY:  [name] is here for a follow up of metastatic breast cancer. She was last seen by me on 11/01/20. She was seen in the infusion area. She reports vomiting, pain, and weakness. She notes she cannot sleep despite the morphine. She reports even signing is hard. She also reports it hurts to have a bowel movement, but she denies constipation.   All other systems were reviewed with the patient and are negative.  MEDICAL HISTORY:  Past Medical History:  Diagnosis Date   Abnormal Pap smear    Anxiety    Breast cancer ([place])    Cancer ([place]) 1999   breast-s/p mastectomy, chemo, rad   CIN I (cervical intraepithelial neoplasia I) 2003   by colpo   Congenital deafness    Deaf    Depression    Port-A-Cath in place 2017   for chemotherapy    Radiation 07/12/15-07/26/15   left femur 30 Gy   S/P bilateral oophorectomy     SURGICAL HISTORY: Past Surgical History:  Procedure Laterality Date   ABDOMINAL HYSTERECTOMY     INTRAMEDULLARY (IM) NAIL INTERTROCHANTERIC Left 06/26/2015   Procedure: LEFT BIOMET LONG AFFIXS NAIL;  Surgeon: [name], MD;  Location: [facility];  Service: Orthopedics;  Laterality: Left;   IR GENERIC HISTORICAL  08/06/2015   IR US GUIDE VASC ACCESS LEFT 08/06/2015 Aletta Edouard, MD [facility]   IR GENERIC HISTORICAL  08/06/2015   IR FLUORO GUIDE CV LINE LEFT 08/06/2015 Aletta Edouard, MD [facility]   IR GENERIC HISTORICAL  03/05/2016   IR CV LINE INJECTION 03/05/2016 Markus Daft, MD [facility]   LEFT HEART CATH AND CORONARY ANGIOGRAPHY N/A 12/13/2017   Procedure:  LEFT HEART CATH AND CORONARY ANGIOGRAPHY;  Surgeon: Leonie Man, MD;  Location: [facility];  Service: Cardiovascular;  Laterality: N/A;   MASTECTOMY     right breast   MASTECTOMY     OVARIAN CYST REMOVAL     RADIOLOGY WITH ANESTHESIA Left 06/25/2015   Procedure: MRI OF LEFT HIP WITH OR WITHOUT CONTRAST;  Surgeon: Medication Radiologist, MD;  Location: [facility];  Service: Radiology;  Laterality: Left;  DR. MCINTYRE/MRI   TUBAL LIGATION      I have reviewed the social history and family history with the patient and they are unchanged from previous note.  ALLERGIES:  is allergic to promethazine hcl, chlorhexidine, and diphenhydramine hcl.  MEDICATIONS:  Current Outpatient Medications  Medication Sig Dispense Refill   dexamethasone (DECADRON) 4 MG tablet Take 1 tablet (4 mg total) by mouth daily. For 3-5 days after chemo 30 tablet 0   HYDROmorphone (DILAUDID) 2 MG tablet Take 1-2 tablets (2-4 mg total) by mouth every 4 (four) hours as needed for severe pain. 30 tablet 0   lidocaine-prilocaine (EMLA) cream Apply 1 application topically as needed. Apply to Porta-Cath 1-2 hours prior to access as directed. 90 g 2   LORazepam (ATIVAN) 0.5 MG  tablet Take 1 tablet (0.5 mg total) by mouth every 8 (eight) hours as needed for anxiety (nausea). 20 tablet 0   morphine (MS CONTIN) 15 MG 12 hr tablet Take 1 tablet (15 mg total) by mouth every 12 (twelve) hours. 30 tablet 0   potassium chloride (KLOR-CON) 10 MEQ tablet Take 1 tablet (10 mEq total) by mouth 3 (three) times daily. 90 tablet 2   prochlorperazine (COMPAZINE) 10 MG tablet TAKE 1 TABLET(10 MG) BY MOUTH EVERY 6 HOURS AS NEEDED FOR NAUSEA OR VOMITING 60 tablet 1   zolpidem (AMBIEN) 10 MG tablet Take 1 tablet (10 mg total) by mouth at bedtime as needed for sleep. 30 tablet 0   No current facility-administered medications for this visit.   Facility-Administered Medications Ordered in Other Visits  Medication Dose Route Frequency Provider Last Rate Last Admin   potassium chloride SA (K-DUR) CR tablet 40 mEq  40 mEq Oral Once Truitt Merle, MD       sodium chloride 0.9 % 1,000 mL with potassium chloride 10 mEq infusion   Intravenous Continuous Livesay, Lennis P, MD   Stopped at 09/10/15 1653   sodium chloride 0.9 % injection 10 mL  10 mL Intravenous PRN Livesay, Lennis P, MD        PHYSICAL EXAMINATION: ECOG PERFORMANCE STATUS: 2 - Symptomatic, <50% confined to bed  There were no vitals filed for this visit. Wt Readings from Last 3 Encounters:  11/01/20 132 lb 6.4 oz (60.1 kg)  10/11/20 130 lb 6 oz (59.1 kg)  09/13/20 130 lb 14.4 oz (59.4 kg)     GENERAL:alert, no distress and comfortable SKIN: skin color normal, no rashes or significant lesions EYES: normal, Conjunctiva are pink and non-injected, sclera clear  NEURO: alert & oriented x 3 with fluent speech  LABORATORY DATA:  I have reviewed the data as listed CBC Latest Ref Rng & Units 11/08/2020 11/01/2020 10/11/2020  WBC 4.0 - 10.5 K/uL 3.8(L) 3.2(L) 3.2(L)  Hemoglobin 12.0 - 15.0 g/dL 8.0(L) 9.9(L) 9.2(L)  Hematocrit 36.0 - 46.0 % 23.6(L) 31.1(L) 27.6(L)  Platelets 150 - 400 K/uL 138(L) 189 205     CMP Latest Ref Rng &  Units 11/08/2020 11/01/2020 10/11/2020  Glucose 70 - 99 mg/dL 100(H) 88  87  BUN 6 - 20 mg/dL 6 8 5(L)  Creatinine 0.44 - 1.00 mg/dL 0.81 0.93 0.85  Sodium 135 - 145 mmol/L 141 140 143  Potassium 3.5 - 5.1 mmol/L 3.1(L) 3.6 3.6  Chloride 98 - 111 mmol/L 106 103 107  CO2 22 - 32 mmol/L _0 Calcium 8.9 - 10.3 mg/dL 9.0 9.5 9.3  Total Protein 6.5 - 8.1 g/dL 6.2(L) 7.4 6.7  Total Bilirubin 0.3 - 1.2 mg/dL 1.2 1.2 1.1  Alkaline Phos 38 - 126 U/L 84 84 84  AST 15 - 41 U/L _1 ALT 0 - 44 U/L _2 RADIOGRAPHIC STUDIES: I have personally reviewed the radiological images as listed and agreed with the findings in the report. No results found.    Orders Placed This Encounter  Procedures   Amb Referral to Palliative Care    Referral Priority:   Routine    Referral Type:   Consultation    Referral Reason:   Symptom Managment    Number of Visits Requested:   1   Sample to Blood Bank    Standing Status:   Future    Standing Expiration Date:   11/08/2021   All questions were answered. The patient knows to call the clinic with any problems, questions or concerns. No barriers to learning was detected. The total time spent in the appointment was 30 minutes.     Truitt Merle, MD 11/08/2020   I, Wilburn Mylar, am acting as scribe for Truitt Merle, MD.   I have reviewed the above documentation for accuracy and completeness, and I agree with the above.

## 2020-11-08 NOTE — Patient Instructions (Signed)
[facility]  Discharge Instructions: Thank you for choosing [facility] to provide your oncology and hematology care.   If you have a lab appointment with the [facility], please go directly to the [facility] and check in at the registration area.   Wear comfortable clothing and clothing appropriate for easy access to any Portacath or PICC line.   We strive to give you quality time with your provider. You may need to reschedule your appointment if you arrive late (15 or more minutes).  Arriving late affects you and other patients whose appointments are after yours.  Also, if you miss three or more appointments without notifying the office, you may be dismissed from the clinic at the provider's discretion.      For prescription refill requests, have your pharmacy contact our office and allow 72 hours for refills to be completed.    Today you received the following chemotherapy and/or immunotherapy agents navelbine      To help prevent nausea and vomiting after your treatment, we encourage you to take your nausea medication as directed.  BELOW ARE SYMPTOMS THAT SHOULD BE REPORTED IMMEDIATELY: *FEVER GREATER THAN 100.4 F (38 C) OR HIGHER *CHILLS OR SWEATING *NAUSEA AND VOMITING THAT IS NOT CONTROLLED WITH YOUR NAUSEA MEDICATION *UNUSUAL SHORTNESS OF BREATH *UNUSUAL BRUISING OR BLEEDING *URINARY PROBLEMS (pain or burning when urinating, or frequent urination) *BOWEL PROBLEMS (unusual diarrhea, constipation, pain near the anus) TENDERNESS IN MOUTH AND THROAT WITH OR WITHOUT PRESENCE OF ULCERS (sore throat, sores in mouth, or a toothache) UNUSUAL RASH, SWELLING OR PAIN  UNUSUAL VAGINAL DISCHARGE OR ITCHING   Items with * indicate a potential emergency and should be followed up as soon as possible or go to the Emergency Department if any problems should occur.  Please show the CHEMOTHERAPY ALERT CARD or IMMUNOTHERAPY ALERT CARD at check-in to  the Emergency Department and triage nurse.  Should you have questions after your visit or need to cancel or reschedule your appointment, please contact [facility]  Dept: [phone]  and follow the prompts.  Office hours are 8:00 a.m. to 4:30 p.m. Monday - Friday. Please note that voicemails left after 4:00 p.m. may not be returned until the following business day.  We are closed weekends and major holidays. You have access to a nurse at all times for urgent questions. Please call the main number to the clinic Dept: [phone] and follow the prompts.   For any non-urgent questions, you may also contact your provider using MyChart. We now offer e-Visits for anyone 42 and older to request care online for non-urgent symptoms. For details visit [email or web address].   Also download the MyChart app! Go to the app store, search "MyChart", open the app, select [facility], and log in with your MyChart username and password.  Due to Covid, a mask is required upon entering the hospital/clinic. If you do not have a mask, one will be given to you upon arrival. For doctor visits, patients may have 1 support person aged 64 or older with them. For treatment visits, patients cannot have anyone with them due to current Covid guidelines and our immunocompromised population.

## 2020-11-08 NOTE — Progress Notes (Signed)
Contacted [facility] (pt's preferred pharmacy @[phone]), spoke with Shanon Brow the pharmacist regarding pt's copay on her prescriptions.  Shanon Brow indicated back in September when the pt picked up the Morphine prescription for some reason the prescription was NOT ran on the Medicaid card which would have given her a copay of $4.  He's not sure if maybe when is was prescribed Medicaid required a Prior Authorization and they just dispense the medication on a discount card which was $16.65.  Shanon Brow confirmed receipt of new prescriptions (Dilaudid & MS Contin) and that the pt's copays for both medications is $4 each.  Informed pt of copays for both medications.  Updated pt's medication list.

## 2020-11-08 NOTE — Progress Notes (Signed)
Orders received for :  Potassium chloride 10 meq IVPB x 2 Morphine 4 mg IV x 1  Entered on MD encounter - pharmacy moved to infusion encounter.  Henreitta Leber, PharmD

## 2020-11-09 LAB — CANCER ANTIGEN 27.29: CA 27.29: 169.7 U/mL — ABNORMAL HIGH (ref 0.0–38.6)

## 2020-11-11 ENCOUNTER — Telehealth: Payer: Self-pay | Admitting: Nurse Practitioner

## 2020-11-11 ENCOUNTER — Telehealth: Payer: Self-pay | Admitting: Hematology

## 2020-11-11 NOTE — Telephone Encounter (Signed)
Scheduled per 10/31 sch smg, message was left with pt via interpreter

## 2020-11-11 NOTE — Telephone Encounter (Signed)
Scheduled follow-up appointments per 10/28 los. Patient is aware. 

## 2020-11-12 ENCOUNTER — Telehealth: Payer: Self-pay | Admitting: Radiation Oncology

## 2020-11-12 NOTE — Telephone Encounter (Signed)
Called pt husband to schedule consultation with Dr. Sondra Come, no answer, LVM for return call.

## 2020-11-14 ENCOUNTER — Encounter: Payer: Self-pay | Admitting: Hematology

## 2020-11-15 NOTE — Progress Notes (Addendum)
Histology and Location of Primary Cancer: right breast  Sites of Visceral and Bony Metastatic Disease:  Interval worsening of disease with new areas of increased metabolic activity associated with LEFT hilar and infrahilar lymph nodes and on balance worsening of hypermetabolic areas within the spine, pelvis and appendicular skeleton as discussed.   Of particular note is the marked interval worsening of disease in the RIGHT proximal femur, no current visible lesion other than subtle sclerosis is noted in this area, attention to this and other substantial load bearing areas on subsequent imaging.   No additional sites of new disease or visceral involvement.    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 She had right mastectomy, 4 cycles of AC-T and 5 years of Tamoxifen/AI. -In 06/2015 she had breast cancer recurrence metastatic to the bone.  -she has had multiple endocrine therapy and chemotherapy, overall does not tolerate treatment well and frequently required dose reduction -She understands her cancer is not curable, and the treatment goal is to prolong her life and balance her quality of life -We switched her treatment to CMF and Keytruda on 08/02/20, and continue herceptin. Due to poor tolerance, I dropped the methotrexate and fluorouracil and delayed her third cycle to 09/20/20, so she received cytoxan, Beryle Flock, and herceptin. She tolerated much better with just some fatigue and mild headaches. -restaging PET 10/31/20 unfortunately showed siignificantly worsening disease. I reviewed the scan images with her and discussed the findings. -I discussed her overall poor prognosis, given her limited treatment options.  Her life expectancy is probably 3 to 6 months.  She again expressed wanting to  continue treatment as long as possible.  I recommended next line treatment with vinorelbine, benefit and potential side effect discussed with her, especially neuropathy.  She agrees to proceed.  - I will refer her back to Dr. Sondra Come to discuss additional palliative radiation -will continue trastuzumab  Pain on a scale of 0-10 is:  7-9/10 pain to pelvis, thighs and wrists     If Spine Met(s), symptoms, if any, include: Bowel/Bladder retention or incontinence (please describe): diarrhea with occasional constipation, decreased urination, vaginal discharge, occasional incontinence Numbness or weakness in extremities (please describe): weakness in legs greater than hands Current Decadron regimen, if applicable: 1 tablet (4 mg total) by mouth daily. For 3-5 days after chemo but feels it has been discontinued with new chemo regimen.  Ambulatory status? Walker? Wheelchair?: Ambulatory with Assistance  SAFETY ISSUES: Prior radiation? yes Pacemaker/ICD? no Possible current pregnancy? no, hysterectomy Is the patient on methotrexate? no  Current Complaints / other details:  Decreased appetite, fatigue, occasionally wakes up from sleep with difficulty breathing, headaches  Vitals:   11/20/20 0856  BP: 106/85  Pulse: 96  Resp: 18  Temp: 97.6 F (36.4 C)  TempSrc: Temporal  SpO2: 100%  Weight: 129 lb 2 oz (58.6 kg)  Height: 5' 6"  (1.676 m)

## 2020-11-19 NOTE — Progress Notes (Signed)
Radiation Oncology         [phone] ________________________________  Outpatient Re-Consultation  Name: [name] MRN: [id number]  Date: 11/20/2020  DOB: 1965/05/30  KZ:SWFU, [name], [name] (Inactive)  Truitt Merle, MD   REFERRING PHYSICIAN: Truitt Merle, MD  DIAGNOSIS: The encounter diagnosis was [name], right [facility]).  [name]; recent disease progression    Interval since last radiation: 3 years, 1 month, and 10 days   Indication for treatment:  Palliative      Radiation treatment dates:   09/28/17 - 10/11/17 Site/dose:   Pelvis, Right, Sacro-Illiac/ 27 Gy in 9 fractions of 3 Gy Beams/energy:   3D, Photon/ 6X, 15X  The patient also received postoperative radiation therapy directed at the left femur area 2017.  She received 30 Gray in 10 fractions.  HISTORY OF PRESENT ILLNESS::[name] is a 55 y.o. female who is accompanied by her husband and interpreter. She presents today for re-evaluation of her metastatic right breast cancer, courtesy of Dr. Burr Medico, and for consideration of further palliative radiation treatment. The patient was last seen by me on the date of her final radiation treatment in September of 2019. Since then, the patient continued with treatment consisting of chemotherapy on 10/20/2017 through 04/07/2018.Marland Kitchen CT of the abdomen and pelvis on 10/27/2017 showed stable patchy sclerotic osseous metastases throughout the visualized skeleton, and no new or progressive metastatic disease in the abdomen or pelvis. Whole body bone scan on 01/11/2018 again showed multifocal osseous metastases, with right scapular metastasis, right sacral ala and right L5 metastases, right acetabular metastasis, right proximal femur metastasis, anterior right fourth rib metastasis, and diffuse metastases throughout the left femur. CT of the chest abdomen and pelvis on 01/11/2018 showed no evidence of progressed metastatic  disease in the chest, abdomen, or pelvis. Bone scan on 03/30/2018 again showed stable multifocal bony metastatic disease. CT of the chest abdomen and pelvis on 03/30/2018 showed new mild contiguous wall thickening with associated mucosal hyperenhancement throughout the left colon and rectum, suggesting nonspecific infectious or inflammatory proctocolitis (differential included C diff colitis). No new or progressive osseous metastatic disease was again seen.   The patient underwent another round of chemotherapy on 04/15/2018 through 06/16/2018 consisting of Enhertu.  PET scan on 04/21/2018 showed overall marked interval improvement in bony hypermetabolic disease. All of the previously identified hypermetabolic osseous metastases were seen to have decreased in hypermetabolism in the interval. A new tiny focus of hypermetabolism in the right aspect of the L3 vertebral body was also appreciated, appearing to correspond to a new 8 mm lucency, raising concern for new metastatic disease. Resolution was also seen of a focal hypermetabolism in the central spinal canal at the level of L1. No hypermetabolic soft tissue disease appreciated.  CT of the lumbar spine on 06/16/2018 no longer showed a possible right L3 vertebral body metastasis which was seen on prior PET. Stable degenerative or metastatic lesion was again seen at the right superior endplate, though increased metastasis was seen of a small iliac bone metastasis.   The patient then continued chemotherapy treatment on 06/17/2018 through 11/15/20 consisting of taxol, herceptin, and perjeta. Taxol was discontinued after 11/16/2018 due to disease progression to L3.  PET scan on 11/21/2018 showed a new focal hypermetabolic lesion eccentric to the right in the L3 vertebral body, with a max SUV on 6.1, compatible with a new focus of active malignancy. Very low grade activity similar to blood pool was  seen in the previous existing mildly sclerotic metastatic lesions  including the left iliac bone lesion. Some of the mildly sclerotic lesions were not hypermetabolic, and compatible with effectively treated bony metastatic disease.   Patient completed another course of chemotherapy on 11/30/2018 though 08/25/2019 consisting of herceptin. Therapy was ceased on 08/25/2019 due to significant disease progression (detailed below); patient was switched to IV Matuzumab. Patient also took Tucatinib starting on 12/12/2018 through 12/17/2018; stopped due to nausea, vomiting, poor appetite, and HA. Patient also received IV eribulin on 12/21/2018, though was discontinued on 03/17/2019 due to a mixed response to treatment.   PET on 03/21/2019 showed a mixed response to therapy of osseous metastasis. Although, a right sided L4 lesion demonstrated decreased hypermetabolism, with new left iliac and right ischial hypermetabolic lesions seen. No evidence of hypermetabolic soft tissue metastasis was appreciated.  Patient had an increase in dosage of Neratinib starting on 03/27/2019, though this was again decreased on 04/10/19 due to nausea and vomiting. Dosage was again reduced on 05/10/19. Treatment was discontinued on 07/21/19 due to dry cough, nausea, and body aches.  CT of the head on 06/23/2019 showed no evidence of intracranial abnormality or metastatic disease.    Patient began IV Margetuximab and IV Gemcitabine on 09/15/2019. Carboplatin was added to Gemcitabine starting with C3 on 11/03/19. Treatment was discontinued on 11/03/19 after C3 due to bony disease progression.   PET on 12/13/2019 showed interval progression of widespread FDG avid osseous metastasis involving the axial skeleton. No FDG avid or osseous metastatic disease was appreciated.   Patient started Xeloda and Herceptin on 01/19/20 . Dosage was increased on 02/12/20.   CT of the chest abdomen and pelvis on 04/09/20 demonstrated a new 3 mm right upper lobe nodule, and a possible incomplete periprosthetic fracture  about the cephalo medullary rod in the left proximal femur.  Other findings of bony metastatic disease appeared similar ro prior imaging. Sclerosis and some periosteal reaction about a nondisplaced pathologic fracture of the left lateral seventh rib appreciated as well. Small hypervascular focus was also seen in the right hepatic lobe, noted as indistinct but new compared to previous imaging. Liver MRI was recommended to exclude underlying metastatic lesion.  PET on 07/18/20 showed no signs of visceral or nodal metastatic disease. However, an increased number of hypermetabolic lesions with increased intenisty of many areas, and increased size of affected areas based on the regions of FDG uptake were appreciated. Some areas did show diminshed FDG uptake, such as the right scapula, though the number and size of these areas overall increased.   Patient began chemotherapy consisting of CMF IV on 08/02/20, completed on 10/11/20.   PET scan on 10/31/20 showed interval worsening of disease with new areas of increased metabolic activity associated with the left hilar and infrahilar lymph nodes, as well as worsening of hypermetabolic areas within the spine, pelvis, and apendicular skeleton. A finding of particular significance noted was marked interval worsening of disease in the right proximal femur.   Patient is now currently on chemotherapy consisting of Trastuzumab and Vinorelbine.   During her most recent follow-up with Dr. Burr Medico on 11/08/20, the patient reported vomiting, pain, and weakness, The patient noted that she cannot sleep despite morphine, and stated that it hurts to have a bowel movement. Patient also repored difficulty signing due to pain. Subseqently, Dr. Burr Medico referred the patient back to me to discuss additionnal palliative radiaition for her pain.     PAST MEDICAL HISTORY:  Past Medical History:  Diagnosis Date   Abnormal Pap smear    Anxiety    Breast cancer ([place])    Cancer ([place]) 1999    breast-s/p mastectomy, chemo, rad   CIN I (cervical intraepithelial neoplasia I) 2003   by colpo   Congenital deafness    Deaf    Depression    Port-A-Cath in place 2017   for chemotherapy   Radiation 07/12/15-07/26/15   left femur 30 Gy   S/P bilateral oophorectomy     PAST SURGICAL HISTORY: Past Surgical History:  Procedure Laterality Date   ABDOMINAL HYSTERECTOMY     INTRAMEDULLARY (IM) NAIL INTERTROCHANTERIC Left 06/26/2015   Procedure: LEFT BIOMET LONG AFFIXS NAIL;  Surgeon: [name], MD;  Location: [facility];  Service: Orthopedics;  Laterality: Left;   IR GENERIC HISTORICAL  08/06/2015   IR US GUIDE VASC ACCESS LEFT 08/06/2015 Aletta Edouard, MD [facility]   IR GENERIC HISTORICAL  08/06/2015   IR FLUORO GUIDE CV LINE LEFT 08/06/2015 Aletta Edouard, MD [facility]   IR GENERIC HISTORICAL  03/05/2016   IR CV LINE INJECTION 03/05/2016 Markus Daft, MD [facility]   LEFT HEART CATH AND CORONARY ANGIOGRAPHY N/A 12/13/2017   Procedure: LEFT HEART CATH AND CORONARY ANGIOGRAPHY;  Surgeon: Leonie Man, MD;  Location: [facility];  Service: Cardiovascular;  Laterality: N/A;   MASTECTOMY     right breast   MASTECTOMY     OVARIAN CYST REMOVAL     RADIOLOGY WITH ANESTHESIA Left 06/25/2015   Procedure: MRI OF LEFT HIP WITH OR WITHOUT CONTRAST;  Surgeon: Medication Radiologist, MD;  Location: [facility];  Service: Radiology;  Laterality: Left;  DR. MCINTYRE/MRI   TUBAL LIGATION      FAMILY HISTORY:  Family History  Problem Relation Age of Onset   Hypertension Mother    Seizures Mother    Alzheimer's disease Maternal Uncle    Pancreatitis Maternal Grandmother    Heart attack Maternal Grandfather     SOCIAL HISTORY:  Social History   Tobacco Use   Smoking status: Never   Smokeless tobacco: Never  Substance Use Topics   Alcohol use: No   Drug use: No    ALLERGIES:  Allergies  Allergen Reactions   Promethazine Hcl Shortness Of Breath, Itching and Other (See Comments)     Skin redness, also  NOTE: Pt tolerates PO Compazine   Chlorhexidine Itching   Diphenhydramine Hcl Itching and Rash    MEDICATIONS:  Current Outpatient Medications  Medication Sig Dispense Refill   HYDROmorphone (DILAUDID) 2 MG tablet Take 1-2 tablets (2-4 mg total) by mouth every 4 (four) hours as needed for severe pain. 30 tablet 0   lidocaine-prilocaine (EMLA) cream Apply 1 application topically as needed. Apply to Porta-Cath 1-2 hours prior to access as directed. 90 g 2   LORazepam (ATIVAN) 0.5 MG tablet Take 1 tablet (0.5 mg total) by mouth every 8 (eight) hours as needed for anxiety (nausea). (Patient not taking: Reported on 11/20/2020) 20 tablet 0   morphine (MS CONTIN) 15 MG 12 hr tablet Take 1 tablet (15 mg total) by mouth every 12 (twelve) hours. 30 tablet 0   potassium chloride (KLOR-CON) 10 MEQ tablet Take 1 tablet (10 mEq total) by mouth 3 (three) times daily. 90 tablet 2   prochlorperazine (COMPAZINE) 10 MG tablet TAKE 1 TABLET(10 MG) BY MOUTH EVERY 6 HOURS AS NEEDED FOR NAUSEA OR VOMITING 60 tablet 1   dexamethasone (DECADRON) 4 MG tablet Take 1 tablet (4  mg total) by mouth daily. For 3-5 days after chemo (Patient not taking: No sig reported) 30 tablet 0   zolpidem (AMBIEN) 10 MG tablet Take 1 tablet (10 mg total) by mouth at bedtime as needed for sleep. (Patient not taking: No sig reported) 30 tablet 0   No current facility-administered medications for this encounter.   Facility-Administered Medications Ordered in Other Encounters  Medication Dose Route Frequency Provider Last Rate Last Admin   potassium chloride SA (K-DUR) CR tablet 40 mEq  40 mEq Oral Once Truitt Merle, MD       sodium chloride 0.9 % 1,000 mL with potassium chloride 10 mEq infusion   Intravenous Continuous Livesay, Lennis P, MD   Stopped at 09/10/15 1653   sodium chloride 0.9 % injection 10 mL  10 mL Intravenous PRN Livesay, Lennis P, MD        REVIEW OF SYSTEMS:  A 10+ POINT REVIEW OF SYSTEMS WAS  OBTAINED including neurology, dermatology, psychiatry, cardiac, respiratory, lymph, extremities, GI, GU, musculoskeletal, constitutional, reproductive, HEENT.  She reports pain throughout the pelvis area but more so along the left side.  She also reports some pain along the left femur region related to her orthopedic rod placement she denies any pain within the neck or thoracic spine area.  She reports worsening of her pain when standing along the left side.  She denies any pain with standing as it relates to the right side   PHYSICAL EXAM:  height is 5\' 6"  (1.676 m) and weight is 129 lb 2 oz (58.6 kg). Her temporal temperature is 97.6 F (36.4 C). Her blood pressure is 106/85 and her pulse is 96. Her respiration is 18 and oxygen saturation is 100%.   General: Alert and oriented, in no acute distress HEENT: Head is normocephalic. Extraocular movements are intact.  Neck: Neck is supple, no palpable cervical or supraclavicular lymphadenopathy. Heart: Regular in rate and rhythm with no murmurs, rubs, or gallops. Chest: Clear to auscultation bilaterally, with no rhonchi, wheezes, or rales. Abdomen: Soft, nontender, nondistended, with no rigidity or guarding. Extremities: No cyanosis or edema. Lymphatics: see Neck Exam Skin: No concerning lesions. Musculoskeletal: symmetric strength and muscle tone throughout. Neurologic: Cranial nerves II through XII are grossly intact. No obvious focalities. Speech is fluent. Coordination is intact. Psychiatric: Judgment and insight are intact. Affect is appropriate. Palpation along the lumbar spine and pelvis area reveals some tenderness in the left portion of the pelvis.  She denies any tenderness with palpation along the right pelvis area or right upper femur region.  ECOG = 1  0 - Asymptomatic (Fully active, able to carry on all predisease activities without restriction)  1 - Symptomatic but completely ambulatory (Restricted in physically strenuous activity  but ambulatory and able to carry out work of a light or sedentary nature. For example, light housework, office work)  2 - Symptomatic, <50% in bed during the day (Ambulatory and capable of all self care but unable to carry out any work activities. Up and about more than 50% of waking hours)  3 - Symptomatic, >50% in bed, but not bedbound (Capable of only limited self-care, confined to bed or chair 50% or more of waking hours)  4 - Bedbound (Completely disabled. Cannot carry on any self-care. Totally confined to bed or chair)  5 - Death   Eustace Pen MM, Creech RH, Tormey DC, et al. [phone]). "Toxicity and response criteria of the [facility]". [place] Oncol. 5 (6): [phone]  LABORATORY DATA:  Lab Results  Component Value Date   WBC 3.8 (L) 11/08/2020   HGB 8.0 (L) 11/08/2020   HCT 23.6 (L) 11/08/2020   MCV 87.7 11/08/2020   PLT 138 (L) 11/08/2020   NEUTROABS 3.0 11/08/2020   Lab Results  Component Value Date   NA 141 11/08/2020   K 3.1 (L) 11/08/2020   CL 106 11/08/2020   CO2 24 11/08/2020   GLUCOSE 100 (H) 11/08/2020   CREATININE 0.81 11/08/2020   CALCIUM 9.0 11/08/2020      RADIOGRAPHY: NM PET Image Restage (PS) Skull Base to Thigh (F-18 FDG)  Result Date: 10/31/2020 CLINICAL DATA:  Subsequent treatment strategy for breast cancer follow-up in a 55 year old female. EXAM: NUCLEAR MEDICINE PET SKULL BASE TO THIGH TECHNIQUE: 6.48 mCi F-18 FDG was injected intravenously. Full-ring PET imaging was performed from the skull base to thigh after the radiotracer. CT data was obtained and used for attenuation correction and anatomic localization. Fasting blood glucose: 89 mg/dl COMPARISON:  July 18, 2020. FINDINGS: Mediastinal blood pool activity: SUV max 2.48 Liver activity: SUV max NA NECK: No hypermetabolic lymph nodes in the neck. Incidental CT findings: none CHEST: New LEFT hilar and infrahilar lymph nodes with marked increased metabolic activity (image 09/9) very  small lymph node measuring approximately 6 mm with a maximum SUV of 10.7. LEFT infrahilar uptake (image 67/4) appears to correspond to nodal tissue that measures approximately 6 mm short axis (maximum SUV of 19.6) Incidental CT findings: LEFT-sided Port-A-Cath in situ is unchanged at the caval to atrial junction. Heart size is normal without substantial pericardial effusion. Normal caliber of the thoracic aorta. Normal caliber of central pulmonary vessels. Limited assessment of cardiovascular structures given lack of intravenous contrast. Signs of RIGHT mastectomy as before with breast reconstruction. No sign of pleural effusion. No consolidation. Airways are patent. ABDOMEN/PELVIS: No abnormal hypermetabolic activity within the liver, pancreas, adrenal glands, or spleen. No hypermetabolic lymph nodes in the abdomen or pelvis. Incidental CT findings: No acute findings relative to the liver. Post cholecystectomy. Pancreas, spleen, adrenal glands and kidneys are unremarkable. No hydronephrosis. No nephrolithiasis. The urinary bladder is decompressed. No acute gastrointestinal process. Normal caliber of the abdominal aorta. Smooth contour of the IVC. No pelvic or abdominal adenopathy. SKELETON: Widespread bony metastatic disease with generalized increase in the extent of uptake particularly in lumbar and thoracic spine but also diffusely throughout the LEFT pelvis and also with more pronounced involvement of the RIGHT pelvis, ribs and scapula when compared to previous imaging. Variable SUV values are illustrated below which may indicate mixed response biased towards worsening of disease. Lesion in LEFT lateral C1 with a maximum SUV of 5.2 previously with a maximum SUV of previously 5.8. (Image 40/4) T1-T2 with increased metabolic activity maximum SUV of 11.9 as compared to 19.2 (Image 44/4) maximum SUV of 5.7 LEFT scapula as compared to 7.4. T9 with increased metabolic activity, maximum SUV of 8.3 (image 77/4)  previously 10.0. T11 with marked increased metabolic activity maximum SUV of 12.5 previously with a maximum SUV of approximately 9.5, more diffuse activity on today's study. Similar increase in metabolic activity in I33 and diffusely throughout the lumbar spine. L3 uptake with a maximum SUV of 12.5 as compared to 9.2 with more diffuse activity throughout this vertebral body. Diffuse activity throughout the LEFT sacrum and pelvis more confluent on today's study. Maximum SUV in the posterior LEFT iliac and LEFT sacrum today of 12.9 as compared to 13.6. Subjectively more confluent. More confluent metabolic  activity in RIGHT proximal femur (image 178/4) 7.6. Activity encompasses the femoral neck where there was previously a small focus of activity with a maximum SUV of 4.1. Marked increased confluent activity about the bilateral pubic bones. Visually on CT imaging most findings above are not substantially changed. Incidental CT findings: none IMPRESSION: Interval worsening of disease with new areas of increased metabolic activity associated with LEFT hilar and infrahilar lymph nodes and on balance worsening of hypermetabolic areas within the spine, pelvis and appendicular skeleton as discussed. Of particular note is the marked interval worsening of disease in the RIGHT proximal femur, no current visible lesion other than subtle sclerosis is noted in this area, attention to this and other substantial load bearing areas on subsequent imaging. No additional sites of new disease or visceral involvement. Electronically Signed   By: Zetta Bills M.D.   On: 10/31/2020 12:02      IMPRESSION: [name]; recent disease progression    The patient is symptomatic from her osseous metastasis.  Her most significant changes discussed on PET scan noted in the left pelvis region I would recommend palliative radiation therapy to this region.  The patient has not received any radiation therapy to this  area in the past.  Her PET scan does show significant activity in the right femoral neck region and given this location and risk for fracture would recommend additional radiation therapy to this 1 area along the right side.  Did discuss with the patient that in the future she may need orthopedic stabilization along the right side.  Her CT portion of her images however did not show any significant bony changes in this region although there was some sclerosis in this area.  Today, I talked to the patient and and her husband about the findings and work-up thus far.  We discussed the natural history of metastatic breast cancer and general treatment, highlighting the role of radiotherapy in the management.  We discussed the available radiation techniques, and focused on the details of logistics and delivery.  We reviewed the anticipated acute and late sequelae associated with radiation in this setting.  The patient was encouraged to ask questions that I answered to the best of my ability.  A patient consent form was discussed and signed.  We retained a copy for our records.  The patient would like to proceed with radiation and will be scheduled for CT simulation.  PLAN: The patient would like to delay initiation of her treatment until after Thanksgiving for concerns of ill effects during this time.  At this point she does not seem to be in very severe pain so a delay in initiation of treatment seems reasonable.  Patient will return next week for CT simulation.  Anticipate 10 treatments directed at the left pelvis region.  She will receive approximately 10 treatments directed at the right femoral neck region.   35 minutes of total time was spent for this patient encounter, including preparation, face-to-face counseling with the patient and coordination of care, physical exam, and documentation of the encounter.   ------------------------------------------------  Blair Promise, PhD, MD  This document serves  as a record of services personally performed by Gery Pray, MD. It was created on his behalf by Roney Mans, a trained medical scribe. The creation of this record is based on the scribe's personal observations and the provider's statements to them. This document has been checked and approved by the attending provider.

## 2020-11-20 ENCOUNTER — Inpatient Hospital Stay: Payer: Medicaid Other | Attending: Hematology | Admitting: Nurse Practitioner

## 2020-11-20 ENCOUNTER — Ambulatory Visit
Admission: RE | Admit: 2020-11-20 | Discharge: 2020-11-20 | Disposition: A | Discharge status: Home / Self Care | Payer: Medicaid Other | Source: Ambulatory Visit | Attending: Radiation Oncology | Admitting: Radiation Oncology

## 2020-11-20 ENCOUNTER — Encounter: Payer: Self-pay | Admitting: Radiation Oncology

## 2020-11-20 ENCOUNTER — Other Ambulatory Visit: Payer: Self-pay

## 2020-11-20 VITALS — BP 128/73 | HR 82 | Temp 97.8°F | Resp 18 | Ht 66.0 in | Wt 129.6 lb

## 2020-11-20 VITALS — BP 106/85 | HR 96 | Temp 97.6°F | Resp 18 | Ht 66.0 in | Wt 129.1 lb

## 2020-11-20 DIAGNOSIS — Z9221 Personal history of antineoplastic chemotherapy: Secondary | ICD-10-CM | POA: Diagnosis not present

## 2020-11-20 DIAGNOSIS — R531 Weakness: Secondary | ICD-10-CM | POA: Diagnosis not present

## 2020-11-20 DIAGNOSIS — Z170 Estrogen receptor positive status [ER+]: Secondary | ICD-10-CM | POA: Diagnosis not present

## 2020-11-20 DIAGNOSIS — G620 Drug-induced polyneuropathy: Secondary | ICD-10-CM | POA: Insufficient documentation

## 2020-11-20 DIAGNOSIS — H905 Unspecified sensorineural hearing loss: Secondary | ICD-10-CM | POA: Diagnosis not present

## 2020-11-20 DIAGNOSIS — C7951 Secondary malignant neoplasm of bone: Secondary | ICD-10-CM | POA: Insufficient documentation

## 2020-11-20 DIAGNOSIS — Z5111 Encounter for antineoplastic chemotherapy: Secondary | ICD-10-CM | POA: Insufficient documentation

## 2020-11-20 DIAGNOSIS — Z5112 Encounter for antineoplastic immunotherapy: Secondary | ICD-10-CM | POA: Insufficient documentation

## 2020-11-20 DIAGNOSIS — R112 Nausea with vomiting, unspecified: Secondary | ICD-10-CM | POA: Insufficient documentation

## 2020-11-20 DIAGNOSIS — Z79899 Other long term (current) drug therapy: Secondary | ICD-10-CM | POA: Diagnosis not present

## 2020-11-20 DIAGNOSIS — G893 Neoplasm related pain (acute) (chronic): Secondary | ICD-10-CM | POA: Insufficient documentation

## 2020-11-20 DIAGNOSIS — K5900 Constipation, unspecified: Secondary | ICD-10-CM | POA: Diagnosis not present

## 2020-11-20 DIAGNOSIS — C50911 Malignant neoplasm of unspecified site of right female breast: Secondary | ICD-10-CM | POA: Insufficient documentation

## 2020-11-20 DIAGNOSIS — Z515 Encounter for palliative care: Secondary | ICD-10-CM | POA: Diagnosis not present

## 2020-11-20 DIAGNOSIS — Z923 Personal history of irradiation: Secondary | ICD-10-CM | POA: Diagnosis not present

## 2020-11-20 NOTE — Progress Notes (Signed)
See MD note for nursing evaluation. °

## 2020-11-20 NOTE — Progress Notes (Signed)
  Outpatient Palliative Care  [phone] ________________________________ Nursing Assessment:  Name: [name]        MRN: [id number]  Date of Service: 11/20/2020 DOB: Sep 02, 1965 Visit Type: New patient  Support at Visit: [name], Husband and Interpreter   Review of Systems: General: [name] stated that she does feel weak and that it is "worse than before". She stated that she is very fatigued and is sleeping a lot. She said that small activities, such as cooking, wear her out very quickly. She said sleeping is difficult due to tossing and turning in pain.   Neuro: Reports numbness/tingling in legs, feet, and hands. States this is worse with the colder weather. Reports occasional headaches. Reports dizziness periodically (hx of vertigo) and sometimes with vomiting. Stated dizziness improves with eating.  Cardiac: Reports periodic chest pain. She stated that her chest will sometimes feel "tight". Pulmonary: Reports periodic SHOB. Stated it's most notable in the morning when first waking up.   GI: [name] reports little appetite, states she eats about 1 meal a day.  Reports N/V periodically, with relief from medication.  GU: Reports she doesn't urinate much and doesn't drink much water.  Integumentary: None Psychological: [name] reports nervousness and worry surrounding her illness. She stated that it's hard to talk about. I explained that that's what our team is here for and that we are here if she needs to talk about anything. She expressed gratitude and understanding.    Medication Changes/Additions: questions regarding her Ambien and Morphine but stated she has an appt with Dr. Burr Medico on Friday and will follow up with her    Pain Review: Scale of 0-10: 7-10 Location: L and R leg Description: Throbbing Frequency: Constant Relief: Heat pad, Biofreeze gel, Morphine. Reports "a little" relief from pain but not much.    Social Review: Living Situation:  lives with husband, [name], daughter, and 3 grandkids  Support: Husband and family within the house   Falls in the last 3 months? No  Assistive Device Use? Cane and walker at home  Functional Status: Minimal assist-able to dress and bathe mostly independently, a little help from her husband at times  ACP Form Review:  Family/Patient Concerns: Concerned with fatigue and pain  Provider notified of assessment and patient/family concerns. Patient instructed to call with any questions or concerns.

## 2020-11-20 NOTE — Progress Notes (Signed)
[facility]  [phone] [phone]   Name: [name] Date: 11/20/2020 MRN: [id number]  DOB: 04/11/1965  Patient Care Team: [name], [name], [name] (Inactive) as PCP - General (Physician Assistant) [name], MD as Consulting Physician (Orthopedic Surgery)    REASON FOR CONSULTATION: [name] is a 55 y.o. female with multiple medical problems including metastatic right breast (initial diagnosis 11/1997) s/p right mastectomy, chemotherapy, currently receiving vinorelbine, GERD, vertigo, congenital deafness, chemo-induced neuropathy, osteopenia, depression, anxiety, and cancer-related pain.  Palliative ask to see for symptom management and goals of care.    SOCIAL HISTORY:     reports that she has never smoked. She has never used smokeless tobacco. She reports that she does not drink alcohol and does not use drugs.  ADVANCE DIRECTIVES:  Patient does not have a completed advanced directive. She understands her husband, [name] is considered HCPOA in the absence of such documents. Is aware of advanced directives clinic if she would like for [place] to make an appointment.   CODE STATUS: DNR  PAST MEDICAL HISTORY: Past Medical History:  Diagnosis Date   Abnormal Pap smear    Anxiety    Breast cancer ([place])    Cancer ([place]) 1999   breast-s/p mastectomy, chemo, rad   CIN I (cervical intraepithelial neoplasia I) 2003   by colpo   Congenital deafness    Deaf    Depression    Port-A-Cath in place 2017   for chemotherapy   Radiation 07/12/15-07/26/15   left femur 30 Gy   S/P bilateral oophorectomy     PAST SURGICAL HISTORY:  Past Surgical History:  Procedure Laterality Date   ABDOMINAL HYSTERECTOMY     INTRAMEDULLARY (IM) NAIL INTERTROCHANTERIC Left 06/26/2015   Procedure: LEFT BIOMET LONG AFFIXS NAIL;  Surgeon: [name], MD;  Location: [facility];  Service: Orthopedics;  Laterality: Left;   IR GENERIC  HISTORICAL  08/06/2015   IR US GUIDE VASC ACCESS LEFT 08/06/2015 Aletta Edouard, MD [facility]   IR GENERIC HISTORICAL  08/06/2015   IR FLUORO GUIDE CV LINE LEFT 08/06/2015 Aletta Edouard, MD [facility]   IR GENERIC HISTORICAL  03/05/2016   IR CV LINE INJECTION 03/05/2016 Markus Daft, MD [facility]   LEFT HEART CATH AND CORONARY ANGIOGRAPHY N/A 12/13/2017   Procedure: LEFT HEART CATH AND CORONARY ANGIOGRAPHY;  Surgeon: Leonie Man, MD;  Location: [facility];  Service: Cardiovascular;  Laterality: N/A;   MASTECTOMY     right breast   MASTECTOMY     OVARIAN CYST REMOVAL     RADIOLOGY WITH ANESTHESIA Left 06/25/2015   Procedure: MRI OF LEFT HIP WITH OR WITHOUT CONTRAST;  Surgeon: Medication Radiologist, MD;  Location: [facility];  Service: Radiology;  Laterality: Left;  DR. MCINTYRE/MRI   TUBAL LIGATION      HEMATOLOGY/ONCOLOGY HISTORY:  Oncology History Overview Note   Cancer Staging No matching staging information was found for the patient.     [name], right ([place])  11/1997 Cancer Diagnosis   Patient had 1.4 cm poorly differentiated right breast carcinoma diagnosed in Nov 1999 at age 77, 1/7 axillary nodes involved, ER/PR and HER 2 positive. She had mastectomy with the 7 axillary node evaluation, 4 cycles of adriamycin/ cytoxan followed by taxotere, then five years of tamoxifen thru June 2005 (no herceptin in 1999). She had bilateral oophorectomy in May 2005. She was briefly on aromatase inhibitor after  tamoxifen, but discontinued this herself due to poor tolerance.   04/04/2015 Genetic Testing   Negative genetic testing on the breast/ovarian cancer pnael.  The Breast/Ovarian gene panel offered by [facility] includes sequencing and rearrangement analysis for the following 20 genes:  ATM, BARD1, BRCA1, BRCA2, BRIP1, CDH1, CHEK2, EPCAM, FANCC, MLH1, MSH2, MSH6, NBN, PALB2, PMS2, PTEN, RAD51C, RAD51D, TP53, and XRCC2.   06/26/2015 Pathology Results    Initial Path Report Bone, curettage, Left femur met, intramedullary subtroch tissue - METASTATIC ADENOCARCINOMA.  Estrogen Receptor: 95%, POSITIVE, STRONG STAINING INTENSITY Progesterone Receptor: 2%, POSITIVE, STRONG  HER2 (+), IHC 3+   06/26/2015 Surgery   Biopsy of metastatic tissue and stabilization with Affixus trochanteric nail, proximal and distal interlock of left femur by Dr. [name]    06/28/2015 Imaging   CT Chest w/ Contrast 1. Extensive right internal mammary chain lymphadenopathy consistent with metastatic breast cancer. 2. Lytic metastases involving the posterior elements at T1 with probable nondisplaced pathologic fracture of the spinous process. 3. No other evidence of thoracic metastatic disease. 4. Trace bilateral pleural effusions with associated bibasilar atelectasis. 5. Indeterminate right thyroid nodule, not previously imaged. This could be evaluated with thyroid ultrasound as clinically warranted.   06/29/2015 -  Chemotherapy   Zometa started monthly on 06/29/15 and switched to q36month from 01/08/17   07/01/2015 Imaging   Bone Scan 1. Increased activity noted throughout the left femur. Although metastatic disease cannot be excluded, these changes are most likely secondary to prior surgery. 2. No other focal abnormalities identified to suggest metastatic disease.   07/12/2015 - 07/26/2015 Radiation Therapy   Left femur, 30 Gy in 10 fractions by Dr. [name]   07/14/2015 Initial Diagnosis   [name], right [place]   07/19/2015 Imaging   Initial PET Scan 1. Severe multifocal osseous metastatic disease, much greater than anticipated based on prior imaging. 2. Multifocal nodal metastases involving the right internal mammary, prevascular and subpectoral lymph nodes. No axillary pulmonary involvement identified. 3. No distant extra osseous metastases.   07/30/2015 - 11/29/2015 Chemotherapy   Docetaxel, Herceptin and Perjeta every 3 weeks X6  cycles, pt tolerated moderately well, restaging scan showed excellent response    09/18/2015 Imaging   CT Head w/wo Contrast 1. No acute intracranial abnormality or significant interval change. 2. Stable minimal periventricular white matter hypoattenuation on the right. This may reflect a remote ischemic injury. 3. No evidence for metastatic disease to the brain. 4. No focal soft tissue lesion to explain the patient's tenderness.   10/30/2015 Imaging   CT Abdomen Pelvis w/ Contrast 1. Few mildly prominent fluid-filled loops of small bowel scattered throughout the abdomen, with intraluminal fluid density within the distal colon. Given the provided history, findings are suggestive of acute enteritis/diarrheal illness. 2. No other acute intra-abdominal or pelvic process identified. 3. Widespread osseous metastatic disease, better evaluated on most recent PET-CT from 07/19/2015. No pathologic fracture or other complication. 4. 11 mm hypodensity within the left kidney. This lesion measures intermediate density, and is indeterminate. While this lesion is similar in size relative to recent studies, this is increased in size relative to prior study from 2012. Further evaluation with dedicated renal mass protocol CT and/or MRI is recommended for complete characterization.   12/2015 - 05/14/2016 Chemotherapy   Maintenance Herceptin and pejeta every 3 weeks stopped on 05/14/16 due to disease progression   12/25/2015 Imaging   Restaging PET Scan 1. Markedly improved skeletal activity, with only several faint foci of residual  accentuated metabolic activity, but resolution of the vast majority of the previously extensive osseous metastatic disease. 2. Resolution of the prior right internal mammary and right prevascular lymph nodes. 3. Residual subcutaneous edema and edema along fascia planes in the left upper thigh. This remains somewhat more than I would expect for placement of an IM nail 6 weeks ago. If there  is leg swelling further down, consider Doppler venous ultrasound to rule out left lower extremity DVT. I do not perceive an obvious difference in density between the pelvic and common femoral veins on the noncontrast CT data. 4. Chronic right maxillary and right sphenoid sinusitis.   01/16/2016 - 10/20/2017 Anti-estrogen oral therapy   Letrozole 2.5 mg daily. She was switched to exemestane due to disease progression on 02/08/17. Stopped on 10/20/2017 when treatment changed to chemo    03/01/2016 Miscellaneous   Patient presented to ED following a fall; she reports she landed on her back and is now experiencing back pain. The patient was evaluated and discharge home same day with pain control.   05/01/2016 Imaging   CT CAP IMPRESSION: 1. Stable exam.  No new or progressive disease identified. 2. No evidence for residual or recurrent adenopathy within the chest. 3. Stable bone metastasis.   05/01/2016 Imaging   BONE SCAN IMPRESSION: 1. Small focal uptake involving the anterior rib ends of the left second and right fourth ribs, new since the prior bone scan. The location of this uptake is more suggestive of a traumatic or inflammatory etiology as opposed to metastatic disease. No other evidence of metastatic disease. 2. There are other areas of stable uptake which are most likely degenerative/reactive, including right shoulder and cervical spine uptake and uptake along the right femur adjacent to the ORIF hardware.   05/20/2016 Imaging   NM PET Skull to Thigh IMPRESSION: 1. There multiple metastatic foci in the bony pelvis, including some new abnormal foci compare to the prior PET-CT, compatible with mildly progressive bony metastatic disease. Also, a left T11 vertebral hypermetabolic metastatic lesion is observed, new compared to the prior exam. 2. No active extraosseous malignancy is currently identified. 3. Right anterior fourth rib healing fracture, appears be   06/04/2016 - 01/08/2017  Chemotherapy   Second line chemotherapy Kadcyla every 3 weeks started on 06/04/16 and stopped on 01/08/17 due to mixed response.     10/05/2016 Imaging   CT CAP and Bone Scan:  Bones: left intramedullary rod and left femoral head neck screw in place. In the proximal diaphysis of the left femur there is cortical thickening and irregularity. No lytic or blastic bone lesions. No fracture or vertebral endplate destruction.   No definite evidence of recurrent disease in the chest, abdomen, or pelvis.   10/28/2016 Imaging   CT A/P IMPRESSION: No acute process demonstrated in the abdomen or pelvis. No evidence of bowel obstruction or inflammation.   11/04/2016 Imaging   DG foot complete left: IMPRESSION: No fracture or dislocation.  No soft tissue abnormality   11/23/2016 Imaging   CT RIGHT FEMUR IMPRESSION: Negative CT scan of the right thigh.  No visible metastatic disease.  CT LEFT FEMUR IMPRESSION: 1. Postsurgical changes related to prior cephalomedullary rod fixation of the left femur with linear lucency along the anterolateral proximal femoral cortex at level of the lesser trochanter, suspicious for nondisplaced fracture. 2. Slightly permeative appearance of the proximal left femoral cortex at the site of known prior osseous metastases is largely unchanged. 3. Unchanged patchy lucency and sclerosis along the anterior  acetabulum, which appears to correspond with an area of faint uptake on the prior PET-CT, suspicious for osseous metastasis. These results will be called to the ordering clinician or representative by the Radiologist Assistant, and communication documented in the PACS or zVision Dashboard.       01/27/2017 Imaging   IMPRESSION: 1. Mixed response to osseous metastasis. Some hypermetabolic lesions are new and increasingly hypermetabolic, while another index lesion is decreasingly hypermetabolic. 2. No evidence of hypermetabolic soft tissue metastasis.      02/08/2017 - 10/19/2017 Antibody Plan   -Herceptin every 3 week since 02/08/17 -Oral Lanpantinib 1560m and decreased to 5029mdue to diarrhea and depression. Due to disease progression she swithced to Verzenio 15056mn 05/04/17. She did not toelrate well so she was switched to Ibrance 100m50m 05/31/17. Due to her neutropenia, Ibrance dose reduced to 75 mg daily, 3 weeks on, one-week off, stopped on 10/20/2017 due to disease progression.    04/30/2017 PET scan   IMPRESSION: 1. Primarily increase in hypermetabolism of osseous metastasis. An isolated right acetabular lesion is decreased in hypermetabolism since the prior exam. 2. No evidence of hypermetabolic soft tissue metastasis. 3. Similar non FDG avid right-sided thyroid nodule, favoring a benign etiology. Recommend attention on follow-up.   08/30/2017 PET scan   08/30/2017 PET Scan  IMPRESSION: 1. Worsening osseous metastatic disease. 2. Focal hypermetabolism in the central spinal canal the L1 level, similar to the prior exam. Metastatic implant cannot be excluded. 3. Slight enlargement of a right thyroid nodule which now appears more cystic in character. Consider further evaluation with thyroid ultrasound. If patient is clinically hyperthyroid, consider nuclear medicine thyroid uptake and scan   08/30/2017 Progression   08/30/2017 PET Scan  IMPRESSION: 1. Worsening osseous metastatic disease. 2. Focal hypermetabolism in the central spinal canal the L1 level, similar to the prior exam. Metastatic implant cannot be excluded. 3. Slight enlargement of a right thyroid nodule which now appears more cystic in character. Consider further evaluation with thyroid ultrasound. If patient is clinically hyperthyroid, consider nuclear medicine thyroid uptake and scan   09/29/2017 - 10/11/2017 Radiation Therapy   Pt received 27 Gy in 9 fractions directed at the areas of significant uptake noted on recent bone scan the right pelvis and right proximal  femur, with Dr, KinaSondra Come10/09/2017 - 04/07/2018 Chemotherapy   -Herceptin every 3 weeks starting 02/08/17, perjeta added on 10/20/2017  -weekly Taxol started on 10/21/2017. Will reduce Taxol to 2 weeks on, 1 week off starting on 02/04/17.  Stoped due to mild disease progression   10/27/2017 Imaging   10/27/2017 CT AP IMPRESSION: 1. No acute abnormality. No evidence of bowel obstruction or acute bowel inflammation. Normal appendix. 2. Stable patchy sclerotic osseous metastases throughout the visualized skeleton. No new or progressive metastatic disease in the abdomen or pelvis.   01/11/2018 Imaging   Whole Body Bone Scan 01/11/18  IMPRESSION: 1. Multifocal osseous metastases as described. 2. Right scapular metastasis. 3. Right superior thoracic spinous process. 4. Right sacral ala and right L5 metastases. 5. Right acetabular metastasis. 6. Right proximal femur metastasis. 7. Diffuse metastases throughout the left femur. 8. Anterior right fourth rib metastasis.   01/11/2018 Imaging   CT CAP W Contrast 01/11/18  IMPRESSION: 1. Similar-appearing osseous metastatic disease. 2. No evidence for progressed metastatic disease in the chest, abdomen or pelvis.   03/30/2018 Imaging   Bone Scan 03/30/18  IMPRESSION: Stable multifocal bony metastatic disease.   03/30/2018 Imaging    CT  CAP W Contrast 03/30/18 IMPRESSION: 1. New mild contiguous wall thickening with associated mucosal hyperenhancement throughout the left colon and rectum, suggesting nonspecific infectious or inflammatory proctocolitis. Differential includes C diff colitis. 2. Stable faint patchy sclerosis in the iliac bones and lumbar vertebral bodies. No new or progressive osseous metastatic disease by CT. Please note that the osseous lesions are relatively occult by CT and would be better evaluated by PET-CT, as clinically warranted. 3. No evidence of extra osseous metastatic disease.   These results will be called  to the ordering clinician or representative by the Radiologist Assistant, and communication documented in the PACS or zVision Dashboard.   04/15/2018 - 06/16/2018 Chemotherapy   Enhertu q3weeks starting 04/15/18. Stopped Enhertu after cycle 3 on 05/27/18 due to poor toleration due to poor toleration with anorexia, weight loss, N&V, diarrhea   04/21/2018 PET scan   PET 04/21/18  IMPRESSION: 1. Overall marked interval improvement in bony hypermetabolic disease. All of the previously identified hypermetabolic osseous metastases have decreased in hypermetabolism in the interval. There is a new tiny focus of hypermetabolism in the right aspect of the L3 vertebral body that appears to correspond to a new 8 mm lucency, raising concern for new metastatic disease. Close attention on follow-up recommended. 2. Focal hypermetabolism in the central spinal canal at the level of L1 previously has resolved. 3. No hypermetabolic soft tissue disease on today's study. 4. Right thyroid nodule seen previously has decreased in the interval.     06/16/2018 Imaging   CT LUMBAR SPINE WO CONTRAST 06/16/18 IMPRESSION: 1. Transitional lumbosacral anatomy. Correlation with radiographs is recommended prior to any operative intervention. 2. Suspicion of a right L3 vertebral body metastasis on the April PET-CT is not evident by CT. Stable degenerative versus metastatic lesion at the right L5 superior endplate. Small but increased right iliac bone metastasis since March. Lumbar MRI would best characterize lumbar metastatic disease (without contrast if necessary). 3. Suspect L2-L3 left subarticular segment disc herniation. Query left L2 radiculitis. 4. Stable appearance of rightward L4-L5 disc degeneration which could affect the right foramen and right lateral recess.   06/17/2018 - 11/16/2018 Chemotherapy   Restart Taxol 2 weeks on/1 week off with Herceptin and Perjeta q3weeks on 06/17/18. Perjeta was stopped after C5 due to  diarrhea. Stopped Taxol after 11/16/18 due to disease progression to L3.    07/06/2018 PET scan   PET  IMPRESSION: 1. Near total resolution of hypermetabolic activity associated with skeletal metastasis. Very mild activity associated with the LEFT iliac bone which is similar to background blood pool activity. 2. No new or progressive disease.   11/21/2018 PET scan   IMPRESSION: 1. New focal hypermetabolic lesion eccentric to the right in the L3 vertebral body, maximum SUV 6.1 compatible with a new focus of active malignancy. 2. Very low-grade activity similar to blood pool in previous existing mildly sclerotic metastatic lesions including the left iliac bone lesion. Some of the mildly sclerotic lesions are not hypermetabolic compatible with effectively treated bony metastatic disease. 3. No hypermetabolic soft tissue lesions are identified.     11/30/2018 - 08/25/2019 Chemotherapy   Continue Herceptin q3weeks. Stopped 08/25/19 due to significant disease progression and switched to  IV Matuzumab   12/12/2018 - 12/17/2018 Chemotherapy   Tucatinib (TUKYSA) 6 tabs BID as a next line therapy starting 12/12/18. Stopped 12/17/18 duet o N&V, poor appetite and HA    12/21/2018 - 03/17/2019 Chemotherapy   IV Eribulin on day 1, 8 every 21 days starting 12/21/18.  Stopped 03/17/19 due to mixed response to treatment.    03/21/2019 PET scan   IMPRESSION: 1. Mixed response to therapy of osseous metastasis. Although a right-sided L4 lesion demonstrates decreased hypermetabolism today, new left iliac and right ischial hypermetabolic lesions are seen. 2. No evidence of hypermetabolic soft tissue metastasis.     03/27/2019 - 07/21/2019 Chemotherapy   Neratinib (Nerlynx) titrate up from 136m to 2470mstarting 03/27/19. She could not tolerate 16042mnd stopped on 02/05/19 due to N&V and diarrhea. Will restart at 120m55m 04/10/18-04/27/19 and 4/19-4/22. Reduced to 80mg58me daily on 05/10/19. Held/stopped since 07/21/19  due to dry cough, Nausea and body aches.   06/23/2019 Imaging   CT Head  IMPRESSION: 1. No acute intracranial abnormality or metastatic disease identified. 2. Chronic white matter disease in the right hemisphere is stable from prior exams.     09/05/2019 PET scan   IMPRESSION: 1. Significant progression of widespread hypermetabolic osseous metastases throughout the axial skeleton as detailed. 2. No hypermetabolic extraosseous sites of metastatic disease.   09/15/2019 - 12/22/2019 Chemotherapy   -IV Margetuximab q3weeks and IV Gemcitabine 2 weeks on/1 week off starting 09/15/19. Carboplatin added to Gemcitabine starting with C3 on 11/03/19. D/c after C5 due to disease progression in bone.    12/13/2019 Imaging   PET  IMPRESSION: 1. Interval progression of widespread, FDG avid osseous metastasis involving the axial skeleton. 2. No FDG avid extra osseous sites of metastatic disease.     01/19/2020 -  Chemotherapy   -Xeloda BID 2 weeks on/1 week off and Herceptin q3weeks starting 01/19/20. C1 at 1000mg 26m C2 increased to 1500mg i33me AM and 1000mg in76m PM starting 02/12/20.   04/09/2020 Imaging   CT CAP  IMPRESSION: 1. New 3 mm RIGHT upper lobe nodule, nonspecific, attention on follow-up. 2. What may represent an incomplete periprosthetic fracture about the cephalo medullary rod in the LEFT proximal femur. This linear band of incomplete bony fusion that is more pronounced than on previous CT imaging that was acquired in 2017 and may represent a stress type fracture about hardware. Correlate with any pain in this area. Sclerotic margins show chronic features but the linear band passing through the bone around the intramedullary rod is more extensive, involving approximately 270 degrees of the circumference of the femur, medial cortex remains intact. 3. Other findings of bony metastatic disease are similar to prior imaging. Sclerosis and some periosteal reaction about a  nondisplaced pathologic fracture of the LEFT lateral seventh rib. 4. Small hypervascular focus in the RIGHT hepatic lobe, indistinct but new compared to previous imaging. Perhaps this represents a transient phenomenon though is not imaged on the later phase to allow for confirmation. Liver MRI may be helpful for further evaluation, to exclude underlying metastatic lesion as clinically warranted. 5. Hepatic steatosis. 6. Aortic atherosclerosis.   Aortic Atherosclerosis (ICD10-I70.0).     07/18/2020 Imaging   PET  IMPRESSION: No signs of visceral or nodal metastatic disease.   Increased number of hypermetabolic lesions with increased intensity of many of these areas and increased "size" of affected areas based on the regions of FDG uptake. While there are areas that do show diminished FDG uptake such as in the RIGHT scapula on balance the number and size of these areas has increased.   08/02/2020 - 10/11/2020 Chemotherapy   Patient is on Treatment Plan : BREAST Adjuvant CMF IV q21d     10/31/2020 PET scan   IMPRESSION: Interval worsening of disease with new  areas of increased metabolic activity associated with LEFT hilar and infrahilar lymph nodes and on balance worsening of hypermetabolic areas within the spine, pelvis and appendicular skeleton as discussed.   Of particular note is the marked interval worsening of disease in the RIGHT proximal femur, no current visible lesion other than subtle sclerosis is noted in this area, attention to this and other substantial load bearing areas on subsequent imaging.   No additional sites of new disease or visceral involvement.   11/01/2020 -  Chemotherapy   Patient is on Treatment Plan : BREAST METASTATIC Trastuzumab D1 + Vinorelbine D1,8 (25) q21d       ALLERGIES:  is allergic to promethazine hcl, chlorhexidine, and diphenhydramine hcl.  MEDICATIONS:  Current Outpatient Medications  Medication Sig Dispense Refill   HYDROmorphone  (DILAUDID) 2 MG tablet Take 1-2 tablets (2-4 mg total) by mouth every 4 (four) hours as needed for severe pain. 30 tablet 0   lidocaine-prilocaine (EMLA) cream Apply 1 application topically as needed. Apply to Porta-Cath 1-2 hours prior to access as directed. 90 g 2   morphine (MS CONTIN) 15 MG 12 hr tablet Take 1 tablet (15 mg total) by mouth every 12 (twelve) hours. 30 tablet 0   potassium chloride (KLOR-CON) 10 MEQ tablet Take 1 tablet (10 mEq total) by mouth 3 (three) times daily. 90 tablet 2   prochlorperazine (COMPAZINE) 10 MG tablet TAKE 1 TABLET(10 MG) BY MOUTH EVERY 6 HOURS AS NEEDED FOR NAUSEA OR VOMITING 60 tablet 1   dexamethasone (DECADRON) 4 MG tablet Take 1 tablet (4 mg total) by mouth daily. For 3-5 days after chemo (Patient not taking: No sig reported) 30 tablet 0   LORazepam (ATIVAN) 0.5 MG tablet Take 1 tablet (0.5 mg total) by mouth every 8 (eight) hours as needed for anxiety (nausea). (Patient not taking: Reported on 11/20/2020) 20 tablet 0   zolpidem (AMBIEN) 10 MG tablet Take 1 tablet (10 mg total) by mouth at bedtime as needed for sleep. (Patient not taking: No sig reported) 30 tablet 0   No current facility-administered medications for this visit.   Facility-Administered Medications Ordered in Other Visits  Medication Dose Route Frequency Provider Last Rate Last Admin   potassium chloride SA (K-DUR) CR tablet 40 mEq  40 mEq Oral Once Truitt Merle, MD       sodium chloride 0.9 % 1,000 mL with potassium chloride 10 mEq infusion   Intravenous Continuous Livesay, Tamala Julian, MD   Stopped at 09/10/15 1653   sodium chloride 0.9 % injection 10 mL  10 mL Intravenous PRN Livesay, Lennis P, MD        VITAL SIGNS: BP 128/73 (BP Location: Left Arm, Patient Position: Sitting)   Pulse 82   Temp 97.8 F (36.6 C) (Oral)   Resp 18   Ht _0  (1.676 m)   Wt 129 lb 9.6 oz (58.8 kg)   SpO2 100%   BMI 20.92 kg/m  Filed Weights   11/20/20 1022  Weight: 129 lb 9.6 oz (58.8 kg)     Estimated body mass index is 20.92 kg/m as calculated from the following:   Height as of this encounter: _1  (1.676 m).   Weight as of this encounter: 129 lb 9.6 oz (58.8 kg).  LABS: CBC:    Component Value Date/Time   WBC 3.8 (L) 11/08/2020 0836   WBC 19.0 (H) 10/25/2019 1502   HGB 8.0 (L) 11/08/2020 0836   HGB 11.9 01/08/2017 0909   HCT 23.6 (  L) 11/08/2020 0836   HCT 34.8 01/08/2017 0909   PLT 138 (L) 11/08/2020 0836   PLT 155 01/08/2017 0909   PLT 217 07/24/2014 1604   MCV 87.7 11/08/2020 0836   MCV 85.2 01/08/2017 0909   NEUTROABS 3.0 11/08/2020 0836   NEUTROABS 2.2 01/08/2017 0909   LYMPHSABS 0.5 (L) 11/08/2020 0836   LYMPHSABS 2.0 01/08/2017 0909   MONOABS 0.2 11/08/2020 0836   MONOABS 0.3 01/08/2017 0909   EOSABS 0.1 11/08/2020 0836   EOSABS 0.1 01/08/2017 0909   EOSABS 0.1 07/24/2014 1604   BASOSABS 0.0 11/08/2020 0836   BASOSABS 0.0 01/08/2017 0909   Comprehensive Metabolic Panel:    Component Value Date/Time   NA 141 11/08/2020 0836   NA 141 01/08/2017 0910   K 3.1 (L) 11/08/2020 0836   K 3.3 (L) 01/08/2017 0910   CL 106 11/08/2020 0836   CL 106 05/31/2012 1055   CO2 24 11/08/2020 0836   CO2 26 01/08/2017 0910   BUN 6 11/08/2020 0836   BUN 10.1 01/08/2017 0910   CREATININE 0.81 11/08/2020 0836   CREATININE 0.9 01/08/2017 0910   GLUCOSE 100 (H) 11/08/2020 0836   GLUCOSE 78 01/08/2017 0910   GLUCOSE 88 05/31/2012 1055   CALCIUM 9.0 11/08/2020 0836   CALCIUM 9.2 01/08/2017 0910   AST 19 11/08/2020 0836   AST 21 01/08/2017 0910   ALT 13 11/08/2020 0836   ALT 17 01/08/2017 0910   ALKPHOS 84 11/08/2020 0836   ALKPHOS 64 01/08/2017 0910   BILITOT 1.2 11/08/2020 0836   BILITOT 0.95 01/08/2017 0910   PROT 6.2 (L) 11/08/2020 0836   PROT 7.1 01/08/2017 0910   ALBUMIN 3.4 (L) 11/08/2020 0836   ALBUMIN 3.4 (L) 01/08/2017 0910    RADIOGRAPHIC STUDIES: NM PET Image Restage (PS) Skull Base to Thigh (F-18 FDG)  Result Date: 10/31/2020 CLINICAL DATA:   Subsequent treatment strategy for breast cancer follow-up in a 55 year old female. EXAM: NUCLEAR MEDICINE PET SKULL BASE TO THIGH TECHNIQUE: 6.48 mCi F-18 FDG was injected intravenously. Full-ring PET imaging was performed from the skull base to thigh after the radiotracer. CT data was obtained and used for attenuation correction and anatomic localization. Fasting blood glucose: 89 mg/dl COMPARISON:  July 18, 2020. FINDINGS: Mediastinal blood pool activity: SUV max 2.48 Liver activity: SUV max NA NECK: No hypermetabolic lymph nodes in the neck. Incidental CT findings: none CHEST: New LEFT hilar and infrahilar lymph nodes with marked increased metabolic activity (image 86/5) very small lymph node measuring approximately 6 mm with a maximum SUV of 10.7. LEFT infrahilar uptake (image 67/4) appears to correspond to nodal tissue that measures approximately 6 mm short axis (maximum SUV of 19.6) Incidental CT findings: LEFT-sided Port-A-Cath in situ is unchanged at the caval to atrial junction. Heart size is normal without substantial pericardial effusion. Normal caliber of the thoracic aorta. Normal caliber of central pulmonary vessels. Limited assessment of cardiovascular structures given lack of intravenous contrast. Signs of RIGHT mastectomy as before with breast reconstruction. No sign of pleural effusion. No consolidation. Airways are patent. ABDOMEN/PELVIS: No abnormal hypermetabolic activity within the liver, pancreas, adrenal glands, or spleen. No hypermetabolic lymph nodes in the abdomen or pelvis. Incidental CT findings: No acute findings relative to the liver. Post cholecystectomy. Pancreas, spleen, adrenal glands and kidneys are unremarkable. No hydronephrosis. No nephrolithiasis. The urinary bladder is decompressed. No acute gastrointestinal process. Normal caliber of the abdominal aorta. Smooth contour of the IVC. No pelvic or abdominal adenopathy. SKELETON: Widespread bony metastatic  disease with generalized  increase in the extent of uptake particularly in lumbar and thoracic spine but also diffusely throughout the LEFT pelvis and also with more pronounced involvement of the RIGHT pelvis, ribs and scapula when compared to previous imaging. Variable SUV values are illustrated below which may indicate mixed response biased towards worsening of disease. Lesion in LEFT lateral C1 with a maximum SUV of 5.2 previously with a maximum SUV of previously 5.8. (Image 40/4) T1-T2 with increased metabolic activity maximum SUV of 11.9 as compared to 19.2 (Image 44/4) maximum SUV of 5.7 LEFT scapula as compared to 7.4. T9 with increased metabolic activity, maximum SUV of 8.3 (image 77/4) previously 10.0. T11 with marked increased metabolic activity maximum SUV of 12.5 previously with a maximum SUV of approximately 9.5, more diffuse activity on today's study. Similar increase in metabolic activity in Z61 and diffusely throughout the lumbar spine. L3 uptake with a maximum SUV of 12.5 as compared to 9.2 with more diffuse activity throughout this vertebral body. Diffuse activity throughout the LEFT sacrum and pelvis more confluent on today's study. Maximum SUV in the posterior LEFT iliac and LEFT sacrum today of 12.9 as compared to 13.6. Subjectively more confluent. More confluent metabolic activity in RIGHT proximal femur (image 178/4) 7.6. Activity encompasses the femoral neck where there was previously a small focus of activity with a maximum SUV of 4.1. Marked increased confluent activity about the bilateral pubic bones. Visually on CT imaging most findings above are not substantially changed. Incidental CT findings: none IMPRESSION: Interval worsening of disease with new areas of increased metabolic activity associated with LEFT hilar and infrahilar lymph nodes and on balance worsening of hypermetabolic areas within the spine, pelvis and appendicular skeleton as discussed. Of particular note is the marked interval worsening of  disease in the RIGHT proximal femur, no current visible lesion other than subtle sclerosis is noted in this area, attention to this and other substantial load bearing areas on subsequent imaging. No additional sites of new disease or visceral involvement. Electronically Signed   By: Zetta Bills M.D.   On: 10/31/2020 12:02    PERFORMANCE STATUS (ECOG) : 2 - Symptomatic, <50% confined to bed  Review of Systems  Constitutional:  Positive for appetite change.  Musculoskeletal:  Positive for arthralgias.  Neurological:  Positive for weakness.  Unless otherwise noted, a complete review of systems is negative.  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 Burr Medico with symptom management and goals of care.  Patient presents to the clinic today with her husband of more than 17 years [name]) and her long-term friend/interpreter.  No acute distress noted.  Patient ambulated to visit today with use of a cane.  I introduced myself and palliative's role in collaboration with her oncology team.  Patient has been verbalized understanding and appreciation.  Patient states she feels more comfortable about our visit as she thought I was going to recommend hospice and discontinuation of treatment.  I assured patient our focus during our visits is for symptom management and additional layer of support based on her wishes.  She is much appreciative.  Patient shares she lives in the home with her daughter, 3 granddaughters, 4 dogs, and 1 cat.  She also has another daughter  and granddaughter who resides in [place].  She is ambulatory in the home. She reports recent concerns with gait instability/balance and has been using a  cane for safety. Denies andy recent falls. Is able to perform most ADLs independently with stand-by assist. She shares her appetite is minimal and some days she may only consume one meal. Does endorse some weight loss. Pain is much better and she feels is controlled on current regimen. Is having some issues sleeping at night.   We discussed at length patients current pain levels and effectiveness. [name] is only taking her MS Contin at night.  States it makes her feel drowsy/"foggy".  She is also taking 2-4 mg of Dilaudid nightly.  She does experience pain throughout the day but states she pushes through as she does not wish to be sleepy.  We discussed the use of MS Contin twice daily to assist with better pain control.  Patient verbalized understanding and is agreeable to at least try to continue with the twice daily regimen to allow better relief throughout the day.  We also discussed if pain does become severe or interrupts her daily activities to consider taking at least 2 mg of Dilaudid.  Again patient verbalized understanding and appreciation.  Recommendations to consider small frequent meals throughout the day in addition to high-protein snacking was discussed.  We discussed foods high in protein in addition to possible protein powder or shakes.  Patient states she does not really care for Ensure.  We discussed Prosource and other high-protein supplements that are not milk-based.  She has recently started [employer] which she has tolerated thus far.  Patient currently has prescription for Ambien however has not taken due to uncertainty if this was acceptable in addition to her Dilaudid or MS Contin.  She generally will only sleep for a couple hours and then wake back up.  She plans to take Ambien to see if this would provide her a restful night.  Patient endorses occasional constipation.  We discussed the use of MiraLAX however she states she is unable to tolerate the consistency despite  mixing and juice or water.  Recommendations to begin taking Colace and senna to assist with bowel regimen.  I empathetically approached discussions regarding goals of care and patient's thoughts/feelings about her current health.  She states she tries not to think about her cancer and what the future will look like although she knows that it is reality.  She emotionally shares that she is hopeful to continue to do well for as long as she can with understanding of her progression and that all current treatments are palliative focused.  Patient does not have a completed advanced directive.  She is aware in the absence of such document her husband is her Education officer, community.  Education provided on advanced directive clinic.  Confirms DNR/DNI.  Patient will contact his if she is interested in attending.  Emotional support provided.  All questions answered and support provided.  PLAN: [name] will try to begin taking her MS Contin twice daily to achieve better pain management throughout the day.  She is currently only taking it at night. Continue Dilaudid 2-4 mg as needed for breakthrough pain. Recommendations for small frequent meals in addition to high protein foods/snacks including protein shakes.  Continue with [employer] instant breakfast. Colace and senna daily for bowel regimen. Ongoing goals of care discussions.  Patient confirms DNR/DNI.  Education provided on advanced directive clinic and MOST form.  Would like to have ongoing  discussions at future visits. I will plan to see patient back in 2-3 weeks in collaboration with her other oncology appointments.   Patient expressed understanding and was in agreement with this plan. She also understands that She can call the clinic at any time with any questions, concerns, or complaints.     Time Total: 45 min   Visit consisted of counseling and education dealing with the complex and emotionally intense issues of symptom management and palliative  care in the setting of serious and potentially life-threatening illness.Greater than Total muscle we worked from the pain that has been in communication with 50%  of this time was spent counseling and coordinating care related to the above assessment and plan.  Signed by: Alda Lea, AGPCNP-BC Palliative Medicine Team

## 2020-11-20 NOTE — Patient Instructions (Signed)
Thank you for allowing me to care for you today in collaboration with Dr. Burr Medico.   As we discussed your MS Contin is available to take twice a day (morning and afternoon).   Continue to take your hydromorphone as prescribed.   Increase nutritional intake. As discussed protein (powders mixed in smoothies, protein shakes, peanut butter, small frequent meals vs 1 large meal).   Colace or Senna for bowel regimen.   If you have any questions or needs please do not hesitate to contact or office or send a message via My Chart.   Jarrett Soho, [place]

## 2020-11-21 ENCOUNTER — Encounter: Payer: Self-pay | Admitting: Hematology

## 2020-11-21 ENCOUNTER — Encounter: Payer: Self-pay | Admitting: General Practice

## 2020-11-21 NOTE — Progress Notes (Signed)
[place] Psychosocial Distress Screening Clinical Social Work  Clinical Social Work was referred by distress screening protocol.  The patient scored a 6 on the Psychosocial Distress Thermometer which indicates moderate distress. Clinical Social Worker  did not contact patient as she was not referred to Education officer, museum or any other referral options  to assess for distress and other psychosocial needs. Please reconsult if patient would benefit from social work support.    [facility] DISTRESS SCREENING 11/20/2020  Screening Type Initial Screening  Distress experienced in past week (1-10) 6  Family Problem type   Emotional problem type Isolation/feeling alone  Physical Problem type Pain;Getting around;Sleep/insomnia;Loss of appetitie;Talking;Changes in urination;Tingling hands/feet;Skin dry/itchy;Swollen arms/legs  Physician notified of physical symptoms Yes  Referral to clinical psychology No  Referral to clinical social work No  Referral to dietition No  Referral to financial advocate No  Referral to support programs No  Referral to palliative care No    Clinical Social Worker follow up needed: No.  If yes, follow up plan:  Beverely Pace, [place], LCSW Clinical Social Worker Phone:  [phone]

## 2020-11-22 ENCOUNTER — Encounter: Payer: Self-pay | Admitting: Hematology

## 2020-11-22 ENCOUNTER — Inpatient Hospital Stay: Payer: Medicaid Other

## 2020-11-22 ENCOUNTER — Inpatient Hospital Stay (HOSPITAL_BASED_OUTPATIENT_CLINIC_OR_DEPARTMENT_OTHER): Payer: Medicaid Other | Admitting: Hematology

## 2020-11-22 ENCOUNTER — Other Ambulatory Visit: Payer: Self-pay

## 2020-11-22 VITALS — BP 117/67 | HR 78 | Temp 98.2°F | Resp 18 | Ht 66.0 in | Wt 131.1 lb

## 2020-11-22 DIAGNOSIS — C7951 Secondary malignant neoplasm of bone: Secondary | ICD-10-CM

## 2020-11-22 DIAGNOSIS — C50911 Malignant neoplasm of unspecified site of right female breast: Secondary | ICD-10-CM

## 2020-11-22 DIAGNOSIS — Z5112 Encounter for antineoplastic immunotherapy: Secondary | ICD-10-CM | POA: Diagnosis present

## 2020-11-22 DIAGNOSIS — Z5111 Encounter for antineoplastic chemotherapy: Secondary | ICD-10-CM | POA: Diagnosis present

## 2020-11-22 DIAGNOSIS — G620 Drug-induced polyneuropathy: Secondary | ICD-10-CM | POA: Diagnosis not present

## 2020-11-22 DIAGNOSIS — Z170 Estrogen receptor positive status [ER+]: Secondary | ICD-10-CM | POA: Diagnosis not present

## 2020-11-22 DIAGNOSIS — Z95828 Presence of other vascular implants and grafts: Secondary | ICD-10-CM

## 2020-11-22 DIAGNOSIS — G893 Neoplasm related pain (acute) (chronic): Secondary | ICD-10-CM | POA: Diagnosis not present

## 2020-11-22 LAB — CMP (CANCER CENTER ONLY)
ALT: 13 U/L (ref 0–44)
AST: 19 U/L (ref 15–41)
Albumin: 3.5 g/dL (ref 3.5–5.0)
Alkaline Phosphatase: 77 U/L (ref 38–126)
Anion gap: 12 (ref 5–15)
BUN: 7 mg/dL (ref 6–20)
CO2: 23 mmol/L (ref 22–32)
Calcium: 8.8 mg/dL — ABNORMAL LOW (ref 8.9–10.3)
Chloride: 106 mmol/L (ref 98–111)
Creatinine: 0.86 mg/dL (ref 0.44–1.00)
GFR, Estimated: >60 mL/min (ref >60)
Glucose, Bld: 135 mg/dL — ABNORMAL HIGH (ref 70–99)
Potassium: 3.3 mmol/L — ABNORMAL LOW (ref 3.5–5.1)
Sodium: 141 mmol/L (ref 135–145)
Total Bilirubin: 1.1 mg/dL (ref 0.3–1.2)
Total Protein: 6.2 g/dL — ABNORMAL LOW (ref 6.5–8.1)

## 2020-11-22 LAB — CBC WITH DIFFERENTIAL (CANCER CENTER ONLY)
Abs Immature Granulocytes: 0.12 10*3/uL — ABNORMAL HIGH (ref 0.00–0.07)
Basophils Absolute: 0 10*3/uL (ref 0.0–0.1)
Basophils Relative: 1 %
Eosinophils Absolute: 0.1 10*3/uL (ref 0.0–0.5)
Eosinophils Relative: 2 %
HCT: 26.1 % — ABNORMAL LOW (ref 36.0–46.0)
Hemoglobin: 8.3 g/dL — ABNORMAL LOW (ref 12.0–15.0)
Immature Granulocytes: 5 %
Lymphocytes Relative: 28 %
Lymphs Abs: 0.6 10*3/uL — ABNORMAL LOW (ref 0.7–4.0)
MCH: 28.8 pg (ref 26.0–34.0)
MCHC: 31.8 g/dL (ref 30.0–36.0)
MCV: 90.6 fL (ref 80.0–100.0)
Monocytes Absolute: 0.3 10*3/uL (ref 0.1–1.0)
Monocytes Relative: 14 %
Neutro Abs: 1.1 10*3/uL — ABNORMAL LOW (ref 1.7–7.7)
Neutrophils Relative %: 50 %
Platelet Count: 226 10*3/uL (ref 150–400)
RBC: 2.88 MIL/uL — ABNORMAL LOW (ref 3.87–5.11)
RDW: 16.6 % — ABNORMAL HIGH (ref 11.5–15.5)
WBC Count: 2.2 10*3/uL — ABNORMAL LOW (ref 4.0–10.5)
nRBC: 0.9 % — ABNORMAL HIGH (ref 0.0–0.2)

## 2020-11-22 LAB — SAMPLE TO BLOOD BANK

## 2020-11-22 MED ORDER — TRASTUZUMAB-DKST CHEMO 150 MG IV SOLR
6.0000 mg/kg | Freq: Once | INTRAVENOUS | Status: AC
  Administered 2020-11-22: 357 mg via INTRAVENOUS
  Filled 2020-11-22: qty 17

## 2020-11-22 MED ORDER — PROCHLORPERAZINE MALEATE 10 MG PO TABS
10.0000 mg | ORAL_TABLET | Freq: Once | ORAL | Status: AC
  Administered 2020-11-22: 10 mg via ORAL
  Filled 2020-11-22: qty 1

## 2020-11-22 MED ORDER — SODIUM CHLORIDE 0.9% FLUSH
10.0000 mL | INTRAVENOUS | Status: DC | PRN
  Administered 2020-11-22: 10 mL

## 2020-11-22 MED ORDER — ACETAMINOPHEN 325 MG PO TABS
650.0000 mg | ORAL_TABLET | Freq: Once | ORAL | Status: AC
  Administered 2020-11-22: 650 mg via ORAL
  Filled 2020-11-22: qty 2

## 2020-11-22 MED ORDER — SODIUM CHLORIDE 0.9 % IJ SOLN: 10.0000 mL | INTRAMUSCULAR | Status: DC | PRN

## 2020-11-22 MED ORDER — SODIUM CHLORIDE 0.9% FLUSH
10.0000 mL | Freq: Once | INTRAVENOUS | Status: AC
  Administered 2020-11-22: 10 mL via INTRAVENOUS

## 2020-11-22 MED ORDER — VINORELBINE TARTRATE CHEMO INJECTION 10 MG/ML
18.0000 mg/m2 | Freq: Once | INTRAVENOUS | Status: AC
  Administered 2020-11-22: 30 mg via INTRAVENOUS
  Filled 2020-11-22: qty 3

## 2020-11-22 MED ORDER — HEPARIN SOD (PORK) LOCK FLUSH 100 UNIT/ML IV SOLN
500.0000 [IU] | Freq: Once | INTRAVENOUS | Status: AC | PRN
  Administered 2020-11-22: 500 [IU]

## 2020-11-22 MED ORDER — SODIUM CHLORIDE 0.9 % IV SOLN: Freq: Once | INTRAVENOUS | Status: AC

## 2020-11-22 MED ORDER — LORATADINE 10 MG PO TABS
10.0000 mg | ORAL_TABLET | Freq: Every day | ORAL | Status: DC
  Administered 2020-11-22: 10 mg via ORAL
  Filled 2020-11-22: qty 1

## 2020-11-22 NOTE — Progress Notes (Signed)
[facility]   [phone] [phone]   Clinic Follow up Note   Patient Care Team: [name], [name], [name] (Inactive) as PCP - General (Physician Assistant) [name], MD as Consulting Physician (Orthopedic Surgery) Pickenpack-Cousar, Carlena Sax, NP as Nurse Practitioner (Nurse Practitioner)  Date of Service:  11/22/2020  CHIEF COMPLAINT: f/u of metastatic breast cancer  CURRENT THERAPY:  Trastuzumab D1, and Vinorelbine D1 and 8 every 21 days, starting 11/01/20 -PENDING palliative radiation  ASSESSMENT & PLAN:  [name] is a 55 y.o. female with   1. 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 She had right mastectomy, 4 cycles of AC-T and 5 years of Tamoxifen/AI. -In 06/2015 she had breast cancer recurrence metastatic to the bone.  -she has had multiple endocrine therapy and chemotherapy, overall does not tolerate treatment well and frequently required dose reduction -She understands her cancer is not curable, and the treatment goal is to prolong her life and balance her quality of life -We switched her treatment to CMF and Keytruda on 08/02/20, and continue herceptin. Due to poor tolerance, I dropped the methotrexate and fluorouracil and delayed her third cycle to 09/20/20, so she received cytoxan, Beryle Flock, and herceptin. She tolerated much better with just some fatigue and mild headaches. -restaging PET 10/31/20 unfortunately showed significantly worsening disease. -she was switched to trastuzumab and vinorelbine on 11/01/20. She is tolerating well with mild nausea. Labs reviewed, her WBC is 2.2 today, will add growth factor to be done on Monday when she is here for CT sim. -she plans to receive palliative radiation after Thanksgiving, so we will move her 12/2 treatment to 12/16 and cancel the 12/9 treatment. -she is almost due for repeat echo.  I will schedule this to be done next month.   2. Symptom Management: nausea and vomiting with weight loss -with her increased bone pains, she has had more nausea and vomiting. Because of this, she has lost weight.  -she did not tolerate Boost or Ensure. I recommended other things, such as milkshakes, smoothies, or Carnation Instant Breakfast. -she reports the compazine is helpful for her nausea.   3. Body pain, secondary to bone mets   -She is being treated with Zometa q31month. -She will continue oral calcium and Vit D and Prilosec for Acid Reflux.  -She has received palliative radiation therapy to the left femur (2017) and the right sacroiliac area (2019) under Dr. [name] -her hip and leg pain has increased recently. Restaging PET 10/31/20 showed worsening of the disease in the bones. -she met with Dr. KSondra Comein rad onc and NP [facility] in symptom management on 11/20/20. She plans to start palliative radiation after Thanksgiving.   4. Goal of care discussion, DNR/DNI -We previously reviewed goal of care and I recommend DNR/DNI, she agreed again 07/19/20. -I again encouraged her to start setting up her living will. Her husband [name] automatically her HCPOA. She is considering changing her HCPOA to her daughter [name] whom she lives with.  -Though her care is palliative, our goal is to prolong her life. She would like to continue treatment.   5. Congenital deafness   6. Mild anemia, secondary to chemotherapy and malignancy  -her hgb decreased to 7.8 on 09/13/20.  -improved off methotrexate and fluorouraci.  -her hgb has gone back down from the trastuzumab/vinorelbine. Hgb 8.3 today (11/22/20), will hold on blood transfusion for now.  7. 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 Mild, only in her feet. -worsened with CMF, remains mild off treatment.     PLAN:   -proceed with C2 trastuzumab and vinorelbine today with dose reduction due to cytopenias  Granix injection on 11/14 and  11/21 -blood transfusion next week if Hg<8.0 -anticipate she will start radiation after Thanksgiving  -Postpone her future appointments on 12/2 and 12/9 to 12/16 and 12/30  -F/u on 12/16 and 12/30    No problem-specific Assessment & Plan notes found for this encounter.   SUMMARY OF ONCOLOGIC HISTORY: Oncology History Overview Note   Cancer Staging No matching staging information was found for the patient.     [name], right ([place])  11/1997 Cancer Diagnosis   Patient had 1.4 cm poorly differentiated right breast carcinoma diagnosed in Nov 1999 at age 72, 1/7 axillary nodes involved, ER/PR and HER 2 positive. She had mastectomy with the 7 axillary node evaluation, 4 cycles of adriamycin/ cytoxan followed by taxotere, then five years of tamoxifen thru June 2005 (no herceptin in 1999). She had bilateral oophorectomy in May 2005. She was briefly on aromatase inhibitor after tamoxifen, but discontinued this herself due to poor tolerance.   04/04/2015 Genetic Testing   Negative genetic testing on the breast/ovarian cancer pnael.  The Breast/Ovarian gene panel offered by [facility] includes sequencing and rearrangement analysis for the following 20 genes:  ATM, BARD1, BRCA1, BRCA2, BRIP1, CDH1, CHEK2, EPCAM, FANCC, MLH1, MSH2, MSH6, NBN, PALB2, PMS2, PTEN, RAD51C, RAD51D, TP53, and XRCC2.   06/26/2015 Pathology Results   Initial Path Report Bone, curettage, Left femur met, intramedullary subtroch tissue - METASTATIC ADENOCARCINOMA.  Estrogen Receptor: 95%, POSITIVE, STRONG STAINING INTENSITY Progesterone Receptor: 2%, POSITIVE, STRONG  HER2 (+), IHC 3+   06/26/2015 Surgery   Biopsy of metastatic tissue and stabilization with Affixus trochanteric nail, proximal and distal interlock of left femur by Dr. [name]    06/28/2015 Imaging   CT Chest w/ Contrast 1. Extensive right internal mammary chain lymphadenopathy consistent with metastatic breast cancer. 2. Lytic  metastases involving the posterior elements at T1 with probable nondisplaced pathologic fracture of the spinous process. 3. No other evidence of thoracic metastatic disease. 4. Trace bilateral pleural effusions with associated bibasilar atelectasis. 5. Indeterminate right thyroid nodule, not previously imaged. This could be evaluated with thyroid ultrasound as clinically warranted.   06/29/2015 -  Chemotherapy   Zometa started monthly on 06/29/15 and switched to q51month from 01/08/17   07/01/2015 Imaging   Bone Scan 1. Increased activity noted throughout the left femur. Although metastatic disease cannot be excluded, these changes are most likely secondary to prior surgery. 2. No other focal abnormalities identified to suggest metastatic disease.   07/12/2015 - 07/26/2015 Radiation Therapy   Left femur, 30 Gy in 10 fractions by Dr. [name]   07/14/2015 Initial Diagnosis   [name], right [place]   07/19/2015 Imaging   Initial PET Scan 1. Severe multifocal osseous metastatic disease, much greater than anticipated based on prior imaging. 2. Multifocal nodal metastases involving the right internal mammary, prevascular and subpectoral lymph nodes. No axillary pulmonary involvement identified. 3. No distant extra osseous metastases.   07/30/2015 - 11/29/2015 Chemotherapy   Docetaxel, Herceptin and Perjeta every 3 weeks X6 cycles, pt tolerated moderately well, restaging scan showed excellent response    09/18/2015 Imaging   CT Head w/wo Contrast 1. No acute intracranial abnormality or significant interval change. 2. Stable minimal periventricular white  matter hypoattenuation on the right. This may reflect a remote ischemic injury. 3. No evidence for metastatic disease to the brain. 4. No focal soft tissue lesion to explain the patient's tenderness.   10/30/2015 Imaging   CT Abdomen Pelvis w/ Contrast 1. Few mildly prominent fluid-filled loops of small bowel scattered  throughout the abdomen, with intraluminal fluid density within the distal colon. Given the provided history, findings are suggestive of acute enteritis/diarrheal illness. 2. No other acute intra-abdominal or pelvic process identified. 3. Widespread osseous metastatic disease, better evaluated on most recent PET-CT from 07/19/2015. No pathologic fracture or other complication. 4. 11 mm hypodensity within the left kidney. This lesion measures intermediate density, and is indeterminate. While this lesion is similar in size relative to recent studies, this is increased in size relative to prior study from 2012. Further evaluation with dedicated renal mass protocol CT and/or MRI is recommended for complete characterization.   12/2015 - 05/14/2016 Chemotherapy   Maintenance Herceptin and pejeta every 3 weeks stopped on 05/14/16 due to disease progression   12/25/2015 Imaging   Restaging PET Scan 1. Markedly improved skeletal activity, with only several faint foci of residual accentuated metabolic activity, but resolution of the vast majority of the previously extensive osseous metastatic disease. 2. Resolution of the prior right internal mammary and right prevascular lymph nodes. 3. Residual subcutaneous edema and edema along fascia planes in the left upper thigh. This remains somewhat more than I would expect for placement of an IM nail 6 weeks ago. If there is leg swelling further down, consider Doppler venous ultrasound to rule out left lower extremity DVT. I do not perceive an obvious difference in density between the pelvic and common femoral veins on the noncontrast CT data. 4. Chronic right maxillary and right sphenoid sinusitis.   01/16/2016 - 10/20/2017 Anti-estrogen oral therapy   Letrozole 2.5 mg daily. She was switched to exemestane due to disease progression on 02/08/17. Stopped on 10/20/2017 when treatment changed to chemo    03/01/2016 Miscellaneous   Patient presented to ED following a fall; she  reports she landed on her back and is now experiencing back pain. The patient was evaluated and discharge home same day with pain control.   05/01/2016 Imaging   CT CAP IMPRESSION: 1. Stable exam.  No new or progressive disease identified. 2. No evidence for residual or recurrent adenopathy within the chest. 3. Stable bone metastasis.   05/01/2016 Imaging   BONE SCAN IMPRESSION: 1. Small focal uptake involving the anterior rib ends of the left second and right fourth ribs, new since the prior bone scan. The location of this uptake is more suggestive of a traumatic or inflammatory etiology as opposed to metastatic disease. No other evidence of metastatic disease. 2. There are other areas of stable uptake which are most likely degenerative/reactive, including right shoulder and cervical spine uptake and uptake along the right femur adjacent to the ORIF hardware.   05/20/2016 Imaging   NM PET Skull to Thigh IMPRESSION: 1. There multiple metastatic foci in the bony pelvis, including some new abnormal foci compare to the prior PET-CT, compatible with mildly progressive bony metastatic disease. Also, a left T11 vertebral hypermetabolic metastatic lesion is observed, new compared to the prior exam. 2. No active extraosseous malignancy is currently identified. 3. Right anterior fourth rib healing fracture, appears be   06/04/2016 - 01/08/2017 Chemotherapy   Second line chemotherapy Kadcyla every 3 weeks started on 06/04/16 and stopped on 01/08/17 due to mixed response.  10/05/2016 Imaging   CT CAP and Bone Scan:  Bones: left intramedullary rod and left femoral head neck screw in place. In the proximal diaphysis of the left femur there is cortical thickening and irregularity. No lytic or blastic bone lesions. No fracture or vertebral endplate destruction.   No definite evidence of recurrent disease in the chest, abdomen, or pelvis.   10/28/2016 Imaging   CT A/P IMPRESSION: No acute  process demonstrated in the abdomen or pelvis. No evidence of bowel obstruction or inflammation.   11/04/2016 Imaging   DG foot complete left: IMPRESSION: No fracture or dislocation.  No soft tissue abnormality   11/23/2016 Imaging   CT RIGHT FEMUR IMPRESSION: Negative CT scan of the right thigh.  No visible metastatic disease.  CT LEFT FEMUR IMPRESSION: 1. Postsurgical changes related to prior cephalomedullary rod fixation of the left femur with linear lucency along the anterolateral proximal femoral cortex at level of the lesser trochanter, suspicious for nondisplaced fracture. 2. Slightly permeative appearance of the proximal left femoral cortex at the site of known prior osseous metastases is largely unchanged. 3. Unchanged patchy lucency and sclerosis along the anterior acetabulum, which appears to correspond with an area of faint uptake on the prior PET-CT, suspicious for osseous metastasis. These results will be called to the ordering clinician or representative by the Radiologist Assistant, and communication documented in the PACS or zVision Dashboard.       01/27/2017 Imaging   IMPRESSION: 1. Mixed response to osseous metastasis. Some hypermetabolic lesions are new and increasingly hypermetabolic, while another index lesion is decreasingly hypermetabolic. 2. No evidence of hypermetabolic soft tissue metastasis.     02/08/2017 - 10/19/2017 Antibody Plan   -Herceptin every 3 week since 02/08/17 -Oral Lanpantinib 1563m and decreased to 5076mdue to diarrhea and depression. Due to disease progression she swithced to Verzenio 15053mn 05/04/17. She did not toelrate well so she was switched to Ibrance 100m60m 05/31/17. Due to her neutropenia, Ibrance dose reduced to 75 mg daily, 3 weeks on, one-week off, stopped on 10/20/2017 due to disease progression.    04/30/2017 PET scan   IMPRESSION: 1. Primarily increase in hypermetabolism of osseous metastasis. An isolated right  acetabular lesion is decreased in hypermetabolism since the prior exam. 2. No evidence of hypermetabolic soft tissue metastasis. 3. Similar non FDG avid right-sided thyroid nodule, favoring a benign etiology. Recommend attention on follow-up.   08/30/2017 PET scan   08/30/2017 PET Scan  IMPRESSION: 1. Worsening osseous metastatic disease. 2. Focal hypermetabolism in the central spinal canal the L1 level, similar to the prior exam. Metastatic implant cannot be excluded. 3. Slight enlargement of a right thyroid nodule which now appears more cystic in character. Consider further evaluation with thyroid ultrasound. If patient is clinically hyperthyroid, consider nuclear medicine thyroid uptake and scan   08/30/2017 Progression   08/30/2017 PET Scan  IMPRESSION: 1. Worsening osseous metastatic disease. 2. Focal hypermetabolism in the central spinal canal the L1 level, similar to the prior exam. Metastatic implant cannot be excluded. 3. Slight enlargement of a right thyroid nodule which now appears more cystic in character. Consider further evaluation with thyroid ultrasound. If patient is clinically hyperthyroid, consider nuclear medicine thyroid uptake and scan   09/29/2017 - 10/11/2017 Radiation Therapy   Pt received 27 Gy in 9 fractions directed at the areas of significant uptake noted on recent bone scan the right pelvis and right proximal femur, with Dr, KinaSondra Come10/09/2017 - 04/07/2018 Chemotherapy   -  Herceptin every 3 weeks starting 02/08/17, perjeta added on 10/20/2017  -weekly Taxol started on 10/21/2017. Will reduce Taxol to 2 weeks on, 1 week off starting on 02/04/17.  Stoped due to mild disease progression   10/27/2017 Imaging   10/27/2017 CT AP IMPRESSION: 1. No acute abnormality. No evidence of bowel obstruction or acute bowel inflammation. Normal appendix. 2. Stable patchy sclerotic osseous metastases throughout the visualized skeleton. No new or progressive metastatic  disease in the abdomen or pelvis.   01/11/2018 Imaging   Whole Body Bone Scan 01/11/18  IMPRESSION: 1. Multifocal osseous metastases as described. 2. Right scapular metastasis. 3. Right superior thoracic spinous process. 4. Right sacral ala and right L5 metastases. 5. Right acetabular metastasis. 6. Right proximal femur metastasis. 7. Diffuse metastases throughout the left femur. 8. Anterior right fourth rib metastasis.   01/11/2018 Imaging   CT CAP W Contrast 01/11/18  IMPRESSION: 1. Similar-appearing osseous metastatic disease. 2. No evidence for progressed metastatic disease in the chest, abdomen or pelvis.   03/30/2018 Imaging   Bone Scan 03/30/18  IMPRESSION: Stable multifocal bony metastatic disease.   03/30/2018 Imaging    CT CAP W Contrast 03/30/18 IMPRESSION: 1. New mild contiguous wall thickening with associated mucosal hyperenhancement throughout the left colon and rectum, suggesting nonspecific infectious or inflammatory proctocolitis. Differential includes C diff colitis. 2. Stable faint patchy sclerosis in the iliac bones and lumbar vertebral bodies. No new or progressive osseous metastatic disease by CT. Please note that the osseous lesions are relatively occult by CT and would be better evaluated by PET-CT, as clinically warranted. 3. No evidence of extra osseous metastatic disease.   These results will be called to the ordering clinician or representative by the Radiologist Assistant, and communication documented in the PACS or zVision Dashboard.   04/15/2018 - 06/16/2018 Chemotherapy   Enhertu q3weeks starting 04/15/18. Stopped Enhertu after cycle 3 on 05/27/18 due to poor toleration due to poor toleration with anorexia, weight loss, N&V, diarrhea   04/21/2018 PET scan   PET 04/21/18  IMPRESSION: 1. Overall marked interval improvement in bony hypermetabolic disease. All of the previously identified hypermetabolic osseous metastases have decreased in  hypermetabolism in the interval. There is a new tiny focus of hypermetabolism in the right aspect of the L3 vertebral body that appears to correspond to a new 8 mm lucency, raising concern for new metastatic disease. Close attention on follow-up recommended. 2. Focal hypermetabolism in the central spinal canal at the level of L1 previously has resolved. 3. No hypermetabolic soft tissue disease on today's study. 4. Right thyroid nodule seen previously has decreased in the interval.     06/16/2018 Imaging   CT LUMBAR SPINE WO CONTRAST 06/16/18 IMPRESSION: 1. Transitional lumbosacral anatomy. Correlation with radiographs is recommended prior to any operative intervention. 2. Suspicion of a right L3 vertebral body metastasis on the April PET-CT is not evident by CT. Stable degenerative versus metastatic lesion at the right L5 superior endplate. Small but increased right iliac bone metastasis since March. Lumbar MRI would best characterize lumbar metastatic disease (without contrast if necessary). 3. Suspect L2-L3 left subarticular segment disc herniation. Query left L2 radiculitis. 4. Stable appearance of rightward L4-L5 disc degeneration which could affect the right foramen and right lateral recess.   06/17/2018 - 11/16/2018 Chemotherapy   Restart Taxol 2 weeks on/1 week off with Herceptin and Perjeta q3weeks on 06/17/18. Perjeta was stopped after C5 due to diarrhea. Stopped Taxol after 11/16/18 due to disease progression to L3.  07/06/2018 PET scan   PET  IMPRESSION: 1. Near total resolution of hypermetabolic activity associated with skeletal metastasis. Very mild activity associated with the LEFT iliac bone which is similar to background blood pool activity. 2. No new or progressive disease.   11/21/2018 PET scan   IMPRESSION: 1. New focal hypermetabolic lesion eccentric to the right in the L3 vertebral body, maximum SUV 6.1 compatible with a new focus of active malignancy. 2. Very  low-grade activity similar to blood pool in previous existing mildly sclerotic metastatic lesions including the left iliac bone lesion. Some of the mildly sclerotic lesions are not hypermetabolic compatible with effectively treated bony metastatic disease. 3. No hypermetabolic soft tissue lesions are identified.     11/30/2018 - 08/25/2019 Chemotherapy   Continue Herceptin q3weeks. Stopped 08/25/19 due to significant disease progression and switched to  IV Matuzumab   12/12/2018 - 12/17/2018 Chemotherapy   Tucatinib (TUKYSA) 6 tabs BID as a next line therapy starting 12/12/18. Stopped 12/17/18 duet o N&V, poor appetite and HA    12/21/2018 - 03/17/2019 Chemotherapy   IV Eribulin on day 1, 8 every 21 days starting 12/21/18. Stopped 03/17/19 due to mixed response to treatment.    03/21/2019 PET scan   IMPRESSION: 1. Mixed response to therapy of osseous metastasis. Although a right-sided L4 lesion demonstrates decreased hypermetabolism today, new left iliac and right ischial hypermetabolic lesions are seen. 2. No evidence of hypermetabolic soft tissue metastasis.     03/27/2019 - 07/21/2019 Chemotherapy   Neratinib (Nerlynx) titrate up from 166m to 2474mstarting 03/27/19. She could not tolerate 16057mnd stopped on 02/05/19 due to N&V and diarrhea. Will restart at 120m60m 04/10/18-04/27/19 and 4/19-4/22. Reduced to 80mg71me daily on 05/10/19. Held/stopped since 07/21/19 due to dry cough, Nausea and body aches.   06/23/2019 Imaging   CT Head  IMPRESSION: 1. No acute intracranial abnormality or metastatic disease identified. 2. Chronic white matter disease in the right hemisphere is stable from prior exams.     09/05/2019 PET scan   IMPRESSION: 1. Significant progression of widespread hypermetabolic osseous metastases throughout the axial skeleton as detailed. 2. No hypermetabolic extraosseous sites of metastatic disease.   09/15/2019 - 12/22/2019 Chemotherapy   -IV Margetuximab q3weeks and IV  Gemcitabine 2 weeks on/1 week off starting 09/15/19. Carboplatin added to Gemcitabine starting with C3 on 11/03/19. D/c after C5 due to disease progression in bone.    12/13/2019 Imaging   PET  IMPRESSION: 1. Interval progression of widespread, FDG avid osseous metastasis involving the axial skeleton. 2. No FDG avid extra osseous sites of metastatic disease.     01/19/2020 -  Chemotherapy   -Xeloda BID 2 weeks on/1 week off and Herceptin q3weeks starting 01/19/20. C1 at 1000mg 23m C2 increased to 1500mg i58me AM and 1000mg in3m PM starting 02/12/20.   04/09/2020 Imaging   CT CAP  IMPRESSION: 1. New 3 mm RIGHT upper lobe nodule, nonspecific, attention on follow-up. 2. What may represent an incomplete periprosthetic fracture about the cephalo medullary rod in the LEFT proximal femur. This linear band of incomplete bony fusion that is more pronounced than on previous CT imaging that was acquired in 2017 and may represent a stress type fracture about hardware. Correlate with any pain in this area. Sclerotic margins show chronic features but the linear band passing through the bone around the intramedullary rod is more extensive, involving approximately 270 degrees of the circumference of the femur, medial cortex remains intact. 3. Other findings  of bony metastatic disease are similar to prior imaging. Sclerosis and some periosteal reaction about a nondisplaced pathologic fracture of the LEFT lateral seventh rib. 4. Small hypervascular focus in the RIGHT hepatic lobe, indistinct but new compared to previous imaging. Perhaps this represents a transient phenomenon though is not imaged on the later phase to allow for confirmation. Liver MRI may be helpful for further evaluation, to exclude underlying metastatic lesion as clinically warranted. 5. Hepatic steatosis. 6. Aortic atherosclerosis.   Aortic Atherosclerosis (ICD10-I70.0).     07/18/2020 Imaging   PET  IMPRESSION: No signs of  visceral or nodal metastatic disease.   Increased number of hypermetabolic lesions with increased intensity of many of these areas and increased "size" of affected areas based on the regions of FDG uptake. While there are areas that do show diminished FDG uptake such as in the RIGHT scapula on balance the number and size of these areas has increased.   08/02/2020 - 10/11/2020 Chemotherapy   Patient is on Treatment Plan : BREAST Adjuvant CMF IV q21d     10/31/2020 PET scan   IMPRESSION: Interval worsening of disease with new areas of increased metabolic activity associated with LEFT hilar and infrahilar lymph nodes and on balance worsening of hypermetabolic areas within the spine, pelvis and appendicular skeleton as discussed.   Of particular note is the marked interval worsening of disease in the RIGHT proximal femur, no current visible lesion other than subtle sclerosis is noted in this area, attention to this and other substantial load bearing areas on subsequent imaging.   No additional sites of new disease or visceral involvement.   11/01/2020 -  Chemotherapy   Patient is on Treatment Plan : BREAST METASTATIC Trastuzumab D1 + Vinorelbine D1,8 (25) q21d        INTERVAL HISTORY:  [name] is here for a follow up of metastatic breast cancer. She was last seen by me on 11/08/20. She presents to the clinic accompanied by and interpreter. She reports she is tolerating treatment well with only mild nausea. She notes she is eating well; she enjoys [place] and [place] foods. She reports she had a fever of 102 at around 2-3 am one day last week.   All other systems were reviewed with the patient and are negative.  MEDICAL HISTORY:  Past Medical History:  Diagnosis Date   Abnormal Pap smear    Anxiety    Breast cancer ([place])    Cancer ([place]) 1999   breast-s/p mastectomy, chemo, rad   CIN I (cervical intraepithelial neoplasia I) 2003   by colpo   Congenital deafness    Deaf     Depression    Port-A-Cath in place 2017   for chemotherapy   Radiation 07/12/15-07/26/15   left femur 30 Gy   S/P bilateral oophorectomy     SURGICAL HISTORY: Past Surgical History:  Procedure Laterality Date   ABDOMINAL HYSTERECTOMY     INTRAMEDULLARY (IM) NAIL INTERTROCHANTERIC Left 06/26/2015   Procedure: LEFT BIOMET LONG AFFIXS NAIL;  Surgeon: [name], MD;  Location: [facility];  Service: Orthopedics;  Laterality: Left;   IR GENERIC HISTORICAL  08/06/2015   IR US GUIDE VASC ACCESS LEFT 08/06/2015 Aletta Edouard, MD [facility]   IR GENERIC HISTORICAL  08/06/2015   IR FLUORO GUIDE CV LINE LEFT 08/06/2015 Aletta Edouard, MD [facility]   IR GENERIC HISTORICAL  03/05/2016   IR CV LINE INJECTION 03/05/2016 Markus Daft, MD [facility]   LEFT HEART CATH AND  CORONARY ANGIOGRAPHY N/A 12/13/2017   Procedure: LEFT HEART CATH AND CORONARY ANGIOGRAPHY;  Surgeon: Leonie Man, MD;  Location: [facility];  Service: Cardiovascular;  Laterality: N/A;   MASTECTOMY     right breast   MASTECTOMY     OVARIAN CYST REMOVAL     RADIOLOGY WITH ANESTHESIA Left 06/25/2015   Procedure: MRI OF LEFT HIP WITH OR WITHOUT CONTRAST;  Surgeon: Medication Radiologist, MD;  Location: [facility];  Service: Radiology;  Laterality: Left;  DR. MCINTYRE/MRI   TUBAL LIGATION      I have reviewed the social history and family history with the patient and they are unchanged from previous note.  ALLERGIES:  is allergic to promethazine hcl, chlorhexidine, and diphenhydramine hcl.  MEDICATIONS:  Current Outpatient Medications  Medication Sig Dispense Refill   dexamethasone (DECADRON) 4 MG tablet Take 1 tablet (4 mg total) by mouth daily. For 3-5 days after chemo (Patient not taking: No sig reported) 30 tablet 0   HYDROmorphone (DILAUDID) 2 MG tablet Take 1-2 tablets (2-4 mg total) by mouth every 4 (four) hours as needed for severe pain. 30 tablet 0   lidocaine-prilocaine (EMLA) cream Apply 1 application topically as  needed. Apply to Porta-Cath 1-2 hours prior to access as directed. 90 g 2   LORazepam (ATIVAN) 0.5 MG tablet Take 1 tablet (0.5 mg total) by mouth every 8 (eight) hours as needed for anxiety (nausea). (Patient not taking: Reported on 11/20/2020) 20 tablet 0   morphine (MS CONTIN) 15 MG 12 hr tablet Take 1 tablet (15 mg total) by mouth every 12 (twelve) hours. 30 tablet 0   potassium chloride (KLOR-CON) 10 MEQ tablet Take 1 tablet (10 mEq total) by mouth 3 (three) times daily. 90 tablet 2   prochlorperazine (COMPAZINE) 10 MG tablet TAKE 1 TABLET(10 MG) BY MOUTH EVERY 6 HOURS AS NEEDED FOR NAUSEA OR VOMITING 60 tablet 1   zolpidem (AMBIEN) 10 MG tablet Take 1 tablet (10 mg total) by mouth at bedtime as needed for sleep. (Patient not taking: No sig reported) 30 tablet 0   No current facility-administered medications for this visit.   Facility-Administered Medications Ordered in Other Visits  Medication Dose Route Frequency Provider Last Rate Last Admin   heparin lock flush 100 unit/mL  500 Units Intracatheter Once PRN Truitt Merle, MD       loratadine (CLARITIN) tablet 10 mg  10 mg Oral Daily Truitt Merle, MD   10 mg at 11/22/20 1054   potassium chloride SA (K-DUR) CR tablet 40 mEq  40 mEq Oral Once Truitt Merle, MD       sodium chloride 0.9 % 1,000 mL with potassium chloride 10 mEq infusion   Intravenous Continuous Livesay, Tamala Julian, MD   Stopped at 09/10/15 1653   sodium chloride 0.9 % injection 10 mL  10 mL Intravenous PRN Livesay, Lennis P, MD       sodium chloride flush (NS) 0.9 % injection 10 mL  10 mL Intracatheter PRN Truitt Merle, MD       trastuzumab-dkst (OGIVRI) 357 mg in sodium chloride 0.9 % 250 mL chemo infusion  6 mg/kg (Treatment Plan Recorded) Intravenous Once Truitt Merle, MD       vinorelbine (NAVELBINE) 30 mg in sodium chloride 0.9 % 50 mL chemo infusion  18 mg/m2 (Treatment Plan Recorded) Intravenous Once Truitt Merle, MD        PHYSICAL EXAMINATION: ECOG PERFORMANCE STATUS: 2 - Symptomatic,  <50% confined to bed  Vitals:  11/22/20 0948  BP: 117/67  Pulse: 78  Resp: 18  Temp: 98.2 F (36.8 C)  SpO2: 98%   Wt Readings from Last 3 Encounters:  11/22/20 131 lb 1.6 oz (59.5 kg)  11/20/20 129 lb 2 oz (58.6 kg)  11/20/20 129 lb 9.6 oz (58.8 kg)     GENERAL:alert, no distress and comfortable SKIN: skin color normal, no rashes or significant lesions EYES: normal, Conjunctiva are pink and non-injected, sclera clear  NEURO: alert & oriented x 3 with fluent speech  LABORATORY DATA:  I have reviewed the data as listed CBC Latest Ref Rng & Units 11/22/2020 11/08/2020 11/01/2020  WBC 4.0 - 10.5 K/uL 2.2(L) 3.8(L) 3.2(L)  Hemoglobin 12.0 - 15.0 g/dL 8.3(L) 8.0(L) 9.9(L)  Hematocrit 36.0 - 46.0 % 26.1(L) 23.6(L) 31.1(L)  Platelets 150 - 400 K/uL 226 138(L) 189     CMP Latest Ref Rng & Units 11/22/2020 11/08/2020 11/01/2020  Glucose 70 - 99 mg/dL 135(H) 100(H) 88  BUN 6 - 20 mg/dL _0 Creatinine 0.44 - 1.00 mg/dL 0.86 0.81 0.93  Sodium 135 - 145 mmol/L 141 141 140  Potassium 3.5 - 5.1 mmol/L 3.3(L) 3.1(L) 3.6  Chloride 98 - 111 mmol/L 106 106 103  CO2 22 - 32 mmol/L _1 Calcium 8.9 - 10.3 mg/dL 8.8(L) 9.0 9.5  Total Protein 6.5 - 8.1 g/dL 6.2(L) 6.2(L) 7.4  Total Bilirubin 0.3 - 1.2 mg/dL 1.1 1.2 1.2  Alkaline Phos 38 - 126 U/L 77 84 84  AST 15 - 41 U/L _2 ALT 0 - 44 U/L _3 RADIOGRAPHIC STUDIES: I have personally reviewed the radiological images as listed and agreed with the findings in the report. No results found.    Orders Placed This Encounter  Procedures   ECHOCARDIOGRAM COMPLETE    Standing Status:   Future    Standing Expiration Date:   11/22/2021    Order Specific Question:   Where should this test be performed    Answer:   [facility]    Order Specific Question:   Perflutren DEFINITY (image enhancing agent) should be administered unless hypersensitivity or allergy exist    Answer:   Administer Perflutren    Order Specific  Question:   Reason for exam-Echo    Answer:   Chemo  Z09   Sample to Blood Bank    Standing Status:   Future    Standing Expiration Date:   11/22/2021   All questions were answered. The patient knows to call the clinic with any problems, questions or concerns. No barriers to learning was detected. The total time spent in the appointment was 30 minutes.     Truitt Merle, MD 11/22/2020   I, Wilburn Mylar, am acting as scribe for Truitt Merle, MD.   I have reviewed the above documentation for accuracy and completeness, and I agree with the above.

## 2020-11-22 NOTE — Patient Instructions (Signed)
[name]  Discharge Instructions: Thank you for choosing [facility] to provide your oncology and hematology care.   If you have a lab appointment with the [facility], please go directly to the [facility] and check in at the registration area.   Wear comfortable clothing and clothing appropriate for easy access to any Portacath or PICC line.   We strive to give you quality time with your provider. You may need to reschedule your appointment if you arrive late (15 or more minutes).  Arriving late affects you and other patients whose appointments are after yours.  Also, if you miss three or more appointments without notifying the office, you may be dismissed from the clinic at the provider's discretion.      For prescription refill requests, have your pharmacy contact our office and allow 72 hours for refills to be completed.    Today you received the following chemotherapy and/or immunotherapy agents: Trastuzumab and Vinorelbine      To help prevent nausea and vomiting after your treatment, we encourage you to take your nausea medication as directed.  BELOW ARE SYMPTOMS THAT SHOULD BE REPORTED IMMEDIATELY: *FEVER GREATER THAN 100.4 F (38 C) OR HIGHER *CHILLS OR SWEATING *NAUSEA AND VOMITING THAT IS NOT CONTROLLED WITH YOUR NAUSEA MEDICATION *UNUSUAL SHORTNESS OF BREATH *UNUSUAL BRUISING OR BLEEDING *URINARY PROBLEMS (pain or burning when urinating, or frequent urination) *BOWEL PROBLEMS (unusual diarrhea, constipation, pain near the anus) TENDERNESS IN MOUTH AND THROAT WITH OR WITHOUT PRESENCE OF ULCERS (sore throat, sores in mouth, or a toothache) UNUSUAL RASH, SWELLING OR PAIN  UNUSUAL VAGINAL DISCHARGE OR ITCHING   Items with * indicate a potential emergency and should be followed up as soon as possible or go to the Emergency Department if any problems should occur.  Please show the CHEMOTHERAPY ALERT CARD or IMMUNOTHERAPY ALERT  CARD at check-in to the Emergency Department and triage nurse.  Should you have questions after your visit or need to cancel or reschedule your appointment, please contact [facility]  Dept: [phone]  and follow the prompts.  Office hours are 8:00 a.m. to 4:30 p.m. Monday - Friday. Please note that voicemails left after 4:00 p.m. may not be returned until the following business day.  We are closed weekends and major holidays. You have access to a nurse at all times for urgent questions. Please call the main number to the clinic Dept: [phone] and follow the prompts.   For any non-urgent questions, you may also contact your provider using MyChart. We now offer e-Visits for anyone 67 and older to request care online for non-urgent symptoms. For details visit [email or web address].   Also download the MyChart app! Go to the app store, search "MyChart", open the app, select [facility], and log in with your MyChart username and password.  Due to Covid, a mask is required upon entering the hospital/clinic. If you do not have a mask, one will be given to you upon arrival. For doctor visits, patients may have 1 support person aged 45 or older with them. For treatment visits, patients cannot have anyone with them due to current Covid guidelines and our immunocompromised population.

## 2020-11-22 NOTE — Progress Notes (Signed)
Ok for echo every 6 months per Dr Burr Medico

## 2020-11-22 NOTE — Progress Notes (Signed)
OK To Treat today with ANC 1.1 per Dr. Burr Medico.  Pt to come in on Monday 11/25/2020 for GCSF

## 2020-11-25 ENCOUNTER — Ambulatory Visit
Admission: RE | Admit: 2020-11-25 | Discharge: 2020-11-25 | Disposition: A | Discharge status: Home / Self Care | Payer: Medicaid Other | Source: Ambulatory Visit | Attending: Radiation Oncology | Admitting: Radiation Oncology

## 2020-11-25 ENCOUNTER — Inpatient Hospital Stay: Payer: Medicaid Other

## 2020-11-25 ENCOUNTER — Other Ambulatory Visit: Payer: Self-pay

## 2020-11-25 ENCOUNTER — Other Ambulatory Visit: Payer: Self-pay | Admitting: Hematology

## 2020-11-25 DIAGNOSIS — C50911 Malignant neoplasm of unspecified site of right female breast: Secondary | ICD-10-CM | POA: Diagnosis not present

## 2020-11-25 DIAGNOSIS — C50919 Malignant neoplasm of unspecified site of unspecified female breast: Secondary | ICD-10-CM | POA: Insufficient documentation

## 2020-11-25 DIAGNOSIS — C7951 Secondary malignant neoplasm of bone: Secondary | ICD-10-CM | POA: Diagnosis not present

## 2020-11-25 DIAGNOSIS — Z510 Encounter for antineoplastic radiation therapy: Secondary | ICD-10-CM | POA: Insufficient documentation

## 2020-11-28 DIAGNOSIS — C50911 Malignant neoplasm of unspecified site of right female breast: Secondary | ICD-10-CM | POA: Diagnosis not present

## 2020-11-28 DIAGNOSIS — Z510 Encounter for antineoplastic radiation therapy: Secondary | ICD-10-CM | POA: Diagnosis not present

## 2020-11-28 DIAGNOSIS — C7951 Secondary malignant neoplasm of bone: Secondary | ICD-10-CM | POA: Diagnosis not present

## 2020-11-29 ENCOUNTER — Encounter: Payer: Medicaid Other | Admitting: Nurse Practitioner

## 2020-11-29 ENCOUNTER — Other Ambulatory Visit: Payer: Self-pay

## 2020-11-29 ENCOUNTER — Inpatient Hospital Stay: Payer: Medicaid Other

## 2020-11-29 ENCOUNTER — Ambulatory Visit: Payer: Medicaid Other

## 2020-11-29 VITALS — BP 128/64 | HR 74 | Temp 97.7°F | Resp 18

## 2020-11-29 DIAGNOSIS — C50911 Malignant neoplasm of unspecified site of right female breast: Secondary | ICD-10-CM

## 2020-11-29 DIAGNOSIS — Z5112 Encounter for antineoplastic immunotherapy: Secondary | ICD-10-CM | POA: Diagnosis not present

## 2020-11-29 DIAGNOSIS — C7951 Secondary malignant neoplasm of bone: Secondary | ICD-10-CM

## 2020-11-29 DIAGNOSIS — Z95828 Presence of other vascular implants and grafts: Secondary | ICD-10-CM

## 2020-11-29 LAB — CBC WITH DIFFERENTIAL (CANCER CENTER ONLY)
Abs Immature Granulocytes: 0.06 10*3/uL (ref 0.00–0.07)
Basophils Absolute: 0 10*3/uL (ref 0.0–0.1)
Basophils Relative: 1 %
Eosinophils Absolute: 0.1 10*3/uL (ref 0.0–0.5)
Eosinophils Relative: 2 %
HCT: 24.9 % — ABNORMAL LOW (ref 36.0–46.0)
Hemoglobin: 8 g/dL — ABNORMAL LOW (ref 12.0–15.0)
Immature Granulocytes: 2 %
Lymphocytes Relative: 17 %
Lymphs Abs: 0.7 10*3/uL (ref 0.7–4.0)
MCH: 28.7 pg (ref 26.0–34.0)
MCHC: 32.1 g/dL (ref 30.0–36.0)
MCV: 89.2 fL (ref 80.0–100.0)
Monocytes Absolute: 0.2 10*3/uL (ref 0.1–1.0)
Monocytes Relative: 5 %
Neutro Abs: 3 10*3/uL (ref 1.7–7.7)
Neutrophils Relative %: 73 %
Platelet Count: 204 10*3/uL (ref 150–400)
RBC: 2.79 MIL/uL — ABNORMAL LOW (ref 3.87–5.11)
RDW: 16.2 % — ABNORMAL HIGH (ref 11.5–15.5)
WBC Count: 4 10*3/uL (ref 4.0–10.5)
nRBC: 0 % (ref 0.0–0.2)

## 2020-11-29 LAB — CMP (CANCER CENTER ONLY)
ALT: 16 U/L (ref 0–44)
AST: 20 U/L (ref 15–41)
Albumin: 3.5 g/dL (ref 3.5–5.0)
Alkaline Phosphatase: 82 U/L (ref 38–126)
Anion gap: 12 (ref 5–15)
BUN: 8 mg/dL (ref 6–20)
CO2: 21 mmol/L — ABNORMAL LOW (ref 22–32)
Calcium: 9.2 mg/dL (ref 8.9–10.3)
Chloride: 105 mmol/L (ref 98–111)
Creatinine: 0.96 mg/dL (ref 0.44–1.00)
GFR, Estimated: >60 mL/min (ref >60)
Glucose, Bld: 136 mg/dL — ABNORMAL HIGH (ref 70–99)
Potassium: 3.1 mmol/L — ABNORMAL LOW (ref 3.5–5.1)
Sodium: 138 mmol/L (ref 135–145)
Total Bilirubin: 1.2 mg/dL (ref 0.3–1.2)
Total Protein: 6.5 g/dL (ref 6.5–8.1)

## 2020-11-29 MED ORDER — VINORELBINE TARTRATE CHEMO INJECTION 50 MG/5ML
18.0000 mg/m2 | Freq: Once | INTRAVENOUS | Status: AC
  Administered 2020-11-29: 30 mg via INTRAVENOUS
  Filled 2020-11-29: qty 3

## 2020-11-29 MED ORDER — SODIUM CHLORIDE 0.9% FLUSH: 10.0000 mL | Freq: Once | INTRAVENOUS | Status: DC

## 2020-11-29 MED ORDER — SODIUM CHLORIDE 0.9 % IV SOLN: Freq: Once | INTRAVENOUS | Status: AC

## 2020-11-29 MED ORDER — SODIUM CHLORIDE 0.9% FLUSH
10.0000 mL | INTRAVENOUS | Status: DC | PRN
  Administered 2020-11-29: 10 mL

## 2020-11-29 MED ORDER — SODIUM CHLORIDE 0.9 % IJ SOLN: 10.0000 mL | INTRAMUSCULAR | Status: DC | PRN

## 2020-11-29 MED ORDER — PROCHLORPERAZINE MALEATE 10 MG PO TABS
10.0000 mg | ORAL_TABLET | Freq: Once | ORAL | Status: AC
  Administered 2020-11-29: 10 mg via ORAL
  Filled 2020-11-29: qty 1

## 2020-11-29 MED ORDER — LORATADINE 10 MG PO TABS
10.0000 mg | ORAL_TABLET | Freq: Every day | ORAL | Status: DC
  Administered 2020-11-29: 10 mg via ORAL
  Filled 2020-11-29: qty 1

## 2020-11-29 MED ORDER — HEPARIN SOD (PORK) LOCK FLUSH 100 UNIT/ML IV SOLN
500.0000 [IU] | Freq: Once | INTRAVENOUS | Status: AC | PRN
  Administered 2020-11-29: 500 [IU]

## 2020-11-29 NOTE — Patient Instructions (Signed)
[facility] MEDICAL ONCOLOGY  Discharge Instructions: °Thank you for choosing [facility] to provide your oncology and hematology care.  ° °If you have a lab appointment with the Cancer Center, please go directly to the Cancer Center and check in at the registration area. °  °Wear comfortable clothing and clothing appropriate for easy access to any Portacath or PICC line.  ° °We strive to give you quality time with your provider. You may need to reschedule your appointment if you arrive late (15 or more minutes).  Arriving late affects you and other patients whose appointments are after yours.  Also, if you miss three or more appointments without notifying the office, you may be dismissed from the clinic at the provider’s discretion.    °  °For prescription refill requests, have your pharmacy contact our office and allow 72 hours for refills to be completed.   ° °Today you received the following chemotherapy and/or immunotherapy agents: vinorelbine °  °To help prevent nausea and vomiting after your treatment, we encourage you to take your nausea medication as directed. ° °BELOW ARE SYMPTOMS THAT SHOULD BE REPORTED IMMEDIATELY: °*FEVER GREATER THAN 100.4 F (38 °C) OR HIGHER °*CHILLS OR SWEATING °*NAUSEA AND VOMITING THAT IS NOT CONTROLLED WITH YOUR NAUSEA MEDICATION °*UNUSUAL SHORTNESS OF BREATH °*UNUSUAL BRUISING OR BLEEDING °*URINARY PROBLEMS (pain or burning when urinating, or frequent urination) °*BOWEL PROBLEMS (unusual diarrhea, constipation, pain near the anus) °TENDERNESS IN MOUTH AND THROAT WITH OR WITHOUT PRESENCE OF ULCERS (sore throat, sores in mouth, or a toothache) °UNUSUAL RASH, SWELLING OR PAIN  °UNUSUAL VAGINAL DISCHARGE OR ITCHING  ° °Items with * indicate a potential emergency and should be followed up as soon as possible or go to the Emergency Department if any problems should occur. ° °Please show the CHEMOTHERAPY ALERT CARD or IMMUNOTHERAPY ALERT CARD at check-in to  the Emergency Department and triage nurse. ° °Should you have questions after your visit or need to cancel or reschedule your appointment, please contact [facility]  Dept: [phone]  and follow the prompts.  Office hours are 8:00 a.m. to 4:30 p.m. Monday - Friday. Please note that voicemails left after 4:00 p.m. may not be returned until the following business day.  We are closed weekends and major holidays. You have access to a nurse at all times for urgent questions. Please call the main number to the clinic Dept: [phone] and follow the prompts. ° ° °For any non-urgent questions, you may also contact your provider using MyChart. We now offer e-Visits for anyone 18 and older to request care online for non-urgent symptoms. For details visit [email or web address]. °  °Also download the MyChart app! Go to the app store, search "MyChart", open the app, select [facility], and log in with your MyChart username and password. ° °Due to Covid, a mask is required upon entering the hospital/clinic. If you do not have a mask, one will be given to you upon arrival. For doctor visits, patients may have 1 support person aged 18 or older with them. For treatment visits, patients cannot have anyone with them due to current Covid guidelines and our immunocompromised population.  ° °

## 2020-11-30 LAB — CANCER ANTIGEN 27.29: CA 27.29: 160.9 U/mL — ABNORMAL HIGH (ref 0.0–38.6)

## 2020-12-01 ENCOUNTER — Other Ambulatory Visit: Payer: Self-pay | Admitting: Hematology

## 2020-12-01 DIAGNOSIS — G4700 Insomnia, unspecified: Secondary | ICD-10-CM

## 2020-12-01 MED ORDER — ZOLPIDEM TARTRATE 10 MG PO TABS: 10.0000 mg | ORAL_TABLET | Freq: Every evening | ORAL | 0 refills | Status: DC | PRN

## 2020-12-01 MED ORDER — MORPHINE SULFATE ER 15 MG PO TBCR: 15.0000 mg | EXTENDED_RELEASE_TABLET | Freq: Two times a day (BID) | ORAL | 0 refills | Status: DC

## 2020-12-02 ENCOUNTER — Ambulatory Visit: Payer: Medicaid Other | Admitting: Radiation Oncology

## 2020-12-02 ENCOUNTER — Inpatient Hospital Stay: Payer: Medicaid Other

## 2020-12-02 ENCOUNTER — Other Ambulatory Visit: Payer: Self-pay | Admitting: Hematology

## 2020-12-03 ENCOUNTER — Encounter: Payer: Self-pay | Admitting: Hematology

## 2020-12-03 ENCOUNTER — Ambulatory Visit: Payer: Medicaid Other

## 2020-12-03 MED ORDER — HYDROMORPHONE HCL 2 MG PO TABS
2.0000 mg | ORAL_TABLET | ORAL | 0 refills | Status: DC | PRN
Start: 2020-12-03 — End: 2020-12-27

## 2020-12-04 ENCOUNTER — Ambulatory Visit: Payer: Medicaid Other

## 2020-12-09 ENCOUNTER — Ambulatory Visit
Admission: RE | Admit: 2020-12-09 | Discharge: 2020-12-09 | Disposition: A | Discharge status: Home / Self Care | Payer: Medicaid Other | Source: Ambulatory Visit | Attending: Radiation Oncology | Admitting: Radiation Oncology

## 2020-12-09 ENCOUNTER — Ambulatory Visit: Payer: Medicaid Other

## 2020-12-09 ENCOUNTER — Other Ambulatory Visit: Payer: Self-pay

## 2020-12-09 DIAGNOSIS — C50911 Malignant neoplasm of unspecified site of right female breast: Secondary | ICD-10-CM | POA: Diagnosis not present

## 2020-12-09 DIAGNOSIS — Z510 Encounter for antineoplastic radiation therapy: Secondary | ICD-10-CM | POA: Diagnosis not present

## 2020-12-09 DIAGNOSIS — C7951 Secondary malignant neoplasm of bone: Secondary | ICD-10-CM | POA: Diagnosis not present

## 2020-12-10 ENCOUNTER — Ambulatory Visit
Admission: RE | Admit: 2020-12-10 | Discharge: 2020-12-10 | Disposition: A | Discharge status: Home / Self Care | Payer: Medicaid Other | Source: Ambulatory Visit | Attending: Radiation Oncology | Admitting: Radiation Oncology

## 2020-12-10 DIAGNOSIS — C50911 Malignant neoplasm of unspecified site of right female breast: Secondary | ICD-10-CM | POA: Diagnosis not present

## 2020-12-10 DIAGNOSIS — C7951 Secondary malignant neoplasm of bone: Secondary | ICD-10-CM | POA: Diagnosis not present

## 2020-12-10 DIAGNOSIS — Z510 Encounter for antineoplastic radiation therapy: Secondary | ICD-10-CM | POA: Diagnosis not present

## 2020-12-11 ENCOUNTER — Ambulatory Visit: Payer: Medicaid Other

## 2020-12-12 ENCOUNTER — Ambulatory Visit
Admission: RE | Admit: 2020-12-12 | Discharge: 2020-12-12 | Disposition: A | Discharge status: Home / Self Care | Payer: Medicaid Other | Source: Ambulatory Visit | Attending: Radiation Oncology | Admitting: Radiation Oncology

## 2020-12-12 ENCOUNTER — Inpatient Hospital Stay: Payer: Medicaid Other | Attending: Hematology | Admitting: Physician Assistant

## 2020-12-12 ENCOUNTER — Other Ambulatory Visit: Payer: Self-pay

## 2020-12-12 ENCOUNTER — Inpatient Hospital Stay: Payer: Medicaid Other

## 2020-12-12 ENCOUNTER — Ambulatory Visit (HOSPITAL_COMMUNITY)
Admission: RE | Admit: 2020-12-12 | Discharge: 2020-12-12 | Disposition: A | Discharge status: Home / Self Care | Payer: Medicaid Other | Source: Ambulatory Visit | Attending: Radiation Oncology | Admitting: Radiation Oncology

## 2020-12-12 VITALS — BP 106/55 | HR 81 | Temp 98.2°F | Resp 17 | Ht 66.0 in | Wt 126.9 lb

## 2020-12-12 DIAGNOSIS — H905 Unspecified sensorineural hearing loss: Secondary | ICD-10-CM | POA: Diagnosis not present

## 2020-12-12 DIAGNOSIS — M25551 Pain in right hip: Secondary | ICD-10-CM

## 2020-12-12 DIAGNOSIS — C7951 Secondary malignant neoplasm of bone: Secondary | ICD-10-CM | POA: Insufficient documentation

## 2020-12-12 DIAGNOSIS — R112 Nausea with vomiting, unspecified: Secondary | ICD-10-CM

## 2020-12-12 DIAGNOSIS — Z510 Encounter for antineoplastic radiation therapy: Secondary | ICD-10-CM | POA: Diagnosis present

## 2020-12-12 DIAGNOSIS — C50911 Malignant neoplasm of unspecified site of right female breast: Secondary | ICD-10-CM | POA: Insufficient documentation

## 2020-12-12 DIAGNOSIS — Z170 Estrogen receptor positive status [ER+]: Secondary | ICD-10-CM | POA: Diagnosis not present

## 2020-12-12 DIAGNOSIS — M79651 Pain in right thigh: Secondary | ICD-10-CM | POA: Diagnosis not present

## 2020-12-12 DIAGNOSIS — Z5112 Encounter for antineoplastic immunotherapy: Secondary | ICD-10-CM | POA: Diagnosis present

## 2020-12-12 DIAGNOSIS — G620 Drug-induced polyneuropathy: Secondary | ICD-10-CM | POA: Insufficient documentation

## 2020-12-12 DIAGNOSIS — Z5111 Encounter for antineoplastic chemotherapy: Secondary | ICD-10-CM | POA: Insufficient documentation

## 2020-12-12 DIAGNOSIS — C50919 Malignant neoplasm of unspecified site of unspecified female breast: Secondary | ICD-10-CM

## 2020-12-12 LAB — CBC WITH DIFFERENTIAL (CANCER CENTER ONLY)
Abs Immature Granulocytes: 0.02 10*3/uL (ref 0.00–0.07)
Basophils Absolute: 0 10*3/uL (ref 0.0–0.1)
Basophils Relative: 1 %
Eosinophils Absolute: 0 10*3/uL (ref 0.0–0.5)
Eosinophils Relative: 2 %
HCT: 24.7 % — ABNORMAL LOW (ref 36.0–46.0)
Hemoglobin: 8.3 g/dL — ABNORMAL LOW (ref 12.0–15.0)
Immature Granulocytes: 1 %
Lymphocytes Relative: 15 %
Lymphs Abs: 0.4 10*3/uL — ABNORMAL LOW (ref 0.7–4.0)
MCH: 29.5 pg (ref 26.0–34.0)
MCHC: 33.6 g/dL (ref 30.0–36.0)
MCV: 87.9 fL (ref 80.0–100.0)
Monocytes Absolute: 0.3 10*3/uL (ref 0.1–1.0)
Monocytes Relative: 14 %
Neutro Abs: 1.7 10*3/uL (ref 1.7–7.7)
Neutrophils Relative %: 67 %
Platelet Count: 186 10*3/uL (ref 150–400)
RBC: 2.81 MIL/uL — ABNORMAL LOW (ref 3.87–5.11)
RDW: 17.5 % — ABNORMAL HIGH (ref 11.5–15.5)
WBC Count: 2.5 10*3/uL — ABNORMAL LOW (ref 4.0–10.5)
nRBC: 0 % (ref 0.0–0.2)

## 2020-12-12 LAB — CMP (CANCER CENTER ONLY)
ALT: 14 U/L (ref 0–44)
AST: 22 U/L (ref 15–41)
Albumin: 3.4 g/dL — ABNORMAL LOW (ref 3.5–5.0)
Alkaline Phosphatase: 87 U/L (ref 38–126)
Anion gap: 10 (ref 5–15)
BUN: 7 mg/dL (ref 6–20)
CO2: 25 mmol/L (ref 22–32)
Calcium: 9.4 mg/dL (ref 8.9–10.3)
Chloride: 103 mmol/L (ref 98–111)
Creatinine: 0.81 mg/dL (ref 0.44–1.00)
GFR, Estimated: >60 mL/min (ref >60)
Glucose, Bld: 109 mg/dL — ABNORMAL HIGH (ref 70–99)
Potassium: 3.6 mmol/L (ref 3.5–5.1)
Sodium: 138 mmol/L (ref 135–145)
Total Bilirubin: 1.8 mg/dL — ABNORMAL HIGH (ref 0.3–1.2)
Total Protein: 6.6 g/dL (ref 6.5–8.1)

## 2020-12-12 LAB — RETIC PANEL
Immature Retic Fract: 16.8 % — ABNORMAL HIGH (ref 2.3–15.9)
RBC.: 2.8 MIL/uL — ABNORMAL LOW (ref 3.87–5.11)
Retic Count, Absolute: 120.1 10*3/uL (ref 19.0–186.0)
Retic Ct Pct: 4.3 % — ABNORMAL HIGH (ref 0.4–3.1)
Reticulocyte Hemoglobin: 31 pg (ref >27.9)

## 2020-12-12 LAB — SAMPLE TO BLOOD BANK

## 2020-12-12 LAB — MAGNESIUM: Magnesium: 1.9 mg/dL (ref 1.7–2.4)

## 2020-12-12 MED ORDER — MORPHINE SULFATE (PF) 4 MG/ML IV SOLN: 4.0000 mg | Freq: Once | INTRAVENOUS | Status: DC

## 2020-12-12 MED ORDER — MORPHINE SULFATE (PF) 4 MG/ML IV SOLN
4.0000 mg | Freq: Once | INTRAVENOUS | Status: AC
  Administered 2020-12-12: 4 mg via INTRAVENOUS
  Filled 2020-12-12: qty 1

## 2020-12-12 MED ORDER — PROCHLORPERAZINE EDISYLATE 10 MG/2ML IJ SOLN
10.0000 mg | Freq: Once | INTRAMUSCULAR | Status: AC
  Administered 2020-12-12: 10 mg via INTRAVENOUS
  Filled 2020-12-12: qty 2

## 2020-12-12 MED ORDER — PROCHLORPERAZINE EDISYLATE 10 MG/2ML IJ SOLN: 10.0000 mg | Freq: Once | INTRAMUSCULAR | Status: DC

## 2020-12-12 MED ORDER — SODIUM CHLORIDE 0.9 % IV SOLN: Freq: Once | INTRAVENOUS | Status: AC

## 2020-12-12 NOTE — Patient Instructions (Signed)

## 2020-12-12 NOTE — Progress Notes (Signed)
Symptom Management Consult note [place]    Patient Care Team: [name], [name], [name] (Inactive) as PCP - General (Physician Assistant) [name], MD as Consulting Physician (Orthopedic Surgery) Pickenpack-Cousar, Carlena Sax, NP as Nurse Practitioner (Nurse Practitioner)    Name of the patient: [name]  [id number]  Nov 04, 1965   Date of visit: 12/12/2020    Chief complaint/ Reason for visit- nausea, vomiting, right hip pain  Oncology History Overview Note   Cancer Staging No matching staging information was found for the patient.     [name], right ([place])  11/1997 Cancer Diagnosis   Patient had 1.4 cm poorly differentiated right breast carcinoma diagnosed in Nov 1999 at age 30, 1/7 axillary nodes involved, ER/PR and HER 2 positive. She had mastectomy with the 7 axillary node evaluation, 4 cycles of adriamycin/ cytoxan followed by taxotere, then five years of tamoxifen thru June 2005 (no herceptin in 1999). She had bilateral oophorectomy in May 2005. She was briefly on aromatase inhibitor after tamoxifen, but discontinued this herself due to poor tolerance.   04/04/2015 Genetic Testing   Negative genetic testing on the breast/ovarian cancer pnael.  The Breast/Ovarian gene panel offered by [facility] includes sequencing and rearrangement analysis for the following 20 genes:  ATM, BARD1, BRCA1, BRCA2, BRIP1, CDH1, CHEK2, EPCAM, FANCC, MLH1, MSH2, MSH6, NBN, PALB2, PMS2, PTEN, RAD51C, RAD51D, TP53, and XRCC2.   06/26/2015 Pathology Results   Initial Path Report Bone, curettage, Left femur met, intramedullary subtroch tissue - METASTATIC ADENOCARCINOMA.  Estrogen Receptor: 95%, POSITIVE, STRONG STAINING INTENSITY Progesterone Receptor: 2%, POSITIVE, STRONG  HER2 (+), IHC 3+   06/26/2015 Surgery   Biopsy of metastatic tissue and stabilization with Affixus trochanteric nail, proximal and distal interlock of left femur by  Dr. [name]    06/28/2015 Imaging   CT Chest w/ Contrast 1. Extensive right internal mammary chain lymphadenopathy consistent with metastatic breast cancer. 2. Lytic metastases involving the posterior elements at T1 with probable nondisplaced pathologic fracture of the spinous process. 3. No other evidence of thoracic metastatic disease. 4. Trace bilateral pleural effusions with associated bibasilar atelectasis. 5. Indeterminate right thyroid nodule, not previously imaged. This could be evaluated with thyroid ultrasound as clinically warranted.   06/29/2015 -  Chemotherapy   Zometa started monthly on 06/29/15 and switched to q77month from 01/08/17   07/01/2015 Imaging   Bone Scan 1. Increased activity noted throughout the left femur. Although metastatic disease cannot be excluded, these changes are most likely secondary to prior surgery. 2. No other focal abnormalities identified to suggest metastatic disease.   07/12/2015 - 07/26/2015 Radiation Therapy   Left femur, 30 Gy in 10 fractions by Dr. [name]   07/14/2015 Initial Diagnosis   [name], right [place]   07/19/2015 Imaging   Initial PET Scan 1. Severe multifocal osseous metastatic disease, much greater than anticipated based on prior imaging. 2. Multifocal nodal metastases involving the right internal mammary, prevascular and subpectoral lymph nodes. No axillary pulmonary involvement identified. 3. No distant extra osseous metastases.   07/30/2015 - 11/29/2015 Chemotherapy   Docetaxel, Herceptin and Perjeta every 3 weeks X6 cycles, pt tolerated moderately well, restaging scan showed excellent response    09/18/2015 Imaging   CT Head w/wo Contrast 1. No acute intracranial abnormality or significant interval change. 2. Stable minimal periventricular white matter hypoattenuation on the right. This may reflect a remote ischemic injury. 3. No evidence for metastatic disease  to the brain. 4. No focal soft tissue  lesion to explain the patient's tenderness.   10/30/2015 Imaging   CT Abdomen Pelvis w/ Contrast 1. Few mildly prominent fluid-filled loops of small bowel scattered throughout the abdomen, with intraluminal fluid density within the distal colon. Given the provided history, findings are suggestive of acute enteritis/diarrheal illness. 2. No other acute intra-abdominal or pelvic process identified. 3. Widespread osseous metastatic disease, better evaluated on most recent PET-CT from 07/19/2015. No pathologic fracture or other complication. 4. 11 mm hypodensity within the left kidney. This lesion measures intermediate density, and is indeterminate. While this lesion is similar in size relative to recent studies, this is increased in size relative to prior study from 2012. Further evaluation with dedicated renal mass protocol CT and/or MRI is recommended for complete characterization.   12/2015 - 05/14/2016 Chemotherapy   Maintenance Herceptin and pejeta every 3 weeks stopped on 05/14/16 due to disease progression   12/25/2015 Imaging   Restaging PET Scan 1. Markedly improved skeletal activity, with only several faint foci of residual accentuated metabolic activity, but resolution of the vast majority of the previously extensive osseous metastatic disease. 2. Resolution of the prior right internal mammary and right prevascular lymph nodes. 3. Residual subcutaneous edema and edema along fascia planes in the left upper thigh. This remains somewhat more than I would expect for placement of an IM nail 6 weeks ago. If there is leg swelling further down, consider Doppler venous ultrasound to rule out left lower extremity DVT. I do not perceive an obvious difference in density between the pelvic and common femoral veins on the noncontrast CT data. 4. Chronic right maxillary and right sphenoid sinusitis.   01/16/2016 - 10/20/2017 Anti-estrogen oral therapy   Letrozole 2.5 mg daily. She was switched to exemestane  due to disease progression on 02/08/17. Stopped on 10/20/2017 when treatment changed to chemo    03/01/2016 Miscellaneous   Patient presented to ED following a fall; she reports she landed on her back and is now experiencing back pain. The patient was evaluated and discharge home same day with pain control.   05/01/2016 Imaging   CT CAP IMPRESSION: 1. Stable exam.  No new or progressive disease identified. 2. No evidence for residual or recurrent adenopathy within the chest. 3. Stable bone metastasis.   05/01/2016 Imaging   BONE SCAN IMPRESSION: 1. Small focal uptake involving the anterior rib ends of the left second and right fourth ribs, new since the prior bone scan. The location of this uptake is more suggestive of a traumatic or inflammatory etiology as opposed to metastatic disease. No other evidence of metastatic disease. 2. There are other areas of stable uptake which are most likely degenerative/reactive, including right shoulder and cervical spine uptake and uptake along the right femur adjacent to the ORIF hardware.   05/20/2016 Imaging   NM PET Skull to Thigh IMPRESSION: 1. There multiple metastatic foci in the bony pelvis, including some new abnormal foci compare to the prior PET-CT, compatible with mildly progressive bony metastatic disease. Also, a left T11 vertebral hypermetabolic metastatic lesion is observed, new compared to the prior exam. 2. No active extraosseous malignancy is currently identified. 3. Right anterior fourth rib healing fracture, appears be   06/04/2016 - 01/08/2017 Chemotherapy   Second line chemotherapy Kadcyla every 3 weeks started on 06/04/16 and stopped on 01/08/17 due to mixed response.     10/05/2016 Imaging   CT CAP and Bone Scan:  Bones: left intramedullary rod and  left femoral head neck screw in place. In the proximal diaphysis of the left femur there is cortical thickening and irregularity. No lytic or blastic bone lesions. No fracture  or vertebral endplate destruction.   No definite evidence of recurrent disease in the chest, abdomen, or pelvis.   10/28/2016 Imaging   CT A/P IMPRESSION: No acute process demonstrated in the abdomen or pelvis. No evidence of bowel obstruction or inflammation.   11/04/2016 Imaging   DG foot complete left: IMPRESSION: No fracture or dislocation.  No soft tissue abnormality   11/23/2016 Imaging   CT RIGHT FEMUR IMPRESSION: Negative CT scan of the right thigh.  No visible metastatic disease.  CT LEFT FEMUR IMPRESSION: 1. Postsurgical changes related to prior cephalomedullary rod fixation of the left femur with linear lucency along the anterolateral proximal femoral cortex at level of the lesser trochanter, suspicious for nondisplaced fracture. 2. Slightly permeative appearance of the proximal left femoral cortex at the site of known prior osseous metastases is largely unchanged. 3. Unchanged patchy lucency and sclerosis along the anterior acetabulum, which appears to correspond with an area of faint uptake on the prior PET-CT, suspicious for osseous metastasis. These results will be called to the ordering clinician or representative by the Radiologist Assistant, and communication documented in the PACS or zVision Dashboard.       01/27/2017 Imaging   IMPRESSION: 1. Mixed response to osseous metastasis. Some hypermetabolic lesions are new and increasingly hypermetabolic, while another index lesion is decreasingly hypermetabolic. 2. No evidence of hypermetabolic soft tissue metastasis.     02/08/2017 - 10/19/2017 Antibody Plan   -Herceptin every 3 week since 02/08/17 -Oral Lanpantinib 1554m and decreased to 5012mdue to diarrhea and depression. Due to disease progression she swithced to Verzenio 15051mn 05/04/17. She did not toelrate well so she was switched to Ibrance 100m18m 05/31/17. Due to her neutropenia, Ibrance dose reduced to 75 mg daily, 3 weeks on, one-week off, stopped  on 10/20/2017 due to disease progression.    04/30/2017 PET scan   IMPRESSION: 1. Primarily increase in hypermetabolism of osseous metastasis. An isolated right acetabular lesion is decreased in hypermetabolism since the prior exam. 2. No evidence of hypermetabolic soft tissue metastasis. 3. Similar non FDG avid right-sided thyroid nodule, favoring a benign etiology. Recommend attention on follow-up.   08/30/2017 PET scan   08/30/2017 PET Scan  IMPRESSION: 1. Worsening osseous metastatic disease. 2. Focal hypermetabolism in the central spinal canal the L1 level, similar to the prior exam. Metastatic implant cannot be excluded. 3. Slight enlargement of a right thyroid nodule which now appears more cystic in character. Consider further evaluation with thyroid ultrasound. If patient is clinically hyperthyroid, consider nuclear medicine thyroid uptake and scan   08/30/2017 Progression   08/30/2017 PET Scan  IMPRESSION: 1. Worsening osseous metastatic disease. 2. Focal hypermetabolism in the central spinal canal the L1 level, similar to the prior exam. Metastatic implant cannot be excluded. 3. Slight enlargement of a right thyroid nodule which now appears more cystic in character. Consider further evaluation with thyroid ultrasound. If patient is clinically hyperthyroid, consider nuclear medicine thyroid uptake and scan   09/29/2017 - 10/11/2017 Radiation Therapy   Pt received 27 Gy in 9 fractions directed at the areas of significant uptake noted on recent bone scan the right pelvis and right proximal femur, with Dr, KinaSondra Come10/09/2017 - 04/07/2018 Chemotherapy   -Herceptin every 3 weeks starting 02/08/17, perjeta added on 10/20/2017  -weekly Taxol started on  10/21/2017. Will reduce Taxol to 2 weeks on, 1 week off starting on 02/04/17.  Stoped due to mild disease progression   10/27/2017 Imaging   10/27/2017 CT AP IMPRESSION: 1. No acute abnormality. No evidence of bowel obstruction or  acute bowel inflammation. Normal appendix. 2. Stable patchy sclerotic osseous metastases throughout the visualized skeleton. No new or progressive metastatic disease in the abdomen or pelvis.   01/11/2018 Imaging   Whole Body Bone Scan 01/11/18  IMPRESSION: 1. Multifocal osseous metastases as described. 2. Right scapular metastasis. 3. Right superior thoracic spinous process. 4. Right sacral ala and right L5 metastases. 5. Right acetabular metastasis. 6. Right proximal femur metastasis. 7. Diffuse metastases throughout the left femur. 8. Anterior right fourth rib metastasis.   01/11/2018 Imaging   CT CAP W Contrast 01/11/18  IMPRESSION: 1. Similar-appearing osseous metastatic disease. 2. No evidence for progressed metastatic disease in the chest, abdomen or pelvis.   03/30/2018 Imaging   Bone Scan 03/30/18  IMPRESSION: Stable multifocal bony metastatic disease.   03/30/2018 Imaging    CT CAP W Contrast 03/30/18 IMPRESSION: 1. New mild contiguous wall thickening with associated mucosal hyperenhancement throughout the left colon and rectum, suggesting nonspecific infectious or inflammatory proctocolitis. Differential includes C diff colitis. 2. Stable faint patchy sclerosis in the iliac bones and lumbar vertebral bodies. No new or progressive osseous metastatic disease by CT. Please note that the osseous lesions are relatively occult by CT and would be better evaluated by PET-CT, as clinically warranted. 3. No evidence of extra osseous metastatic disease.   These results will be called to the ordering clinician or representative by the Radiologist Assistant, and communication documented in the PACS or zVision Dashboard.   04/15/2018 - 06/16/2018 Chemotherapy   Enhertu q3weeks starting 04/15/18. Stopped Enhertu after cycle 3 on 05/27/18 due to poor toleration due to poor toleration with anorexia, weight loss, N&V, diarrhea   04/21/2018 PET scan   PET 04/21/18  IMPRESSION: 1.  Overall marked interval improvement in bony hypermetabolic disease. All of the previously identified hypermetabolic osseous metastases have decreased in hypermetabolism in the interval. There is a new tiny focus of hypermetabolism in the right aspect of the L3 vertebral body that appears to correspond to a new 8 mm lucency, raising concern for new metastatic disease. Close attention on follow-up recommended. 2. Focal hypermetabolism in the central spinal canal at the level of L1 previously has resolved. 3. No hypermetabolic soft tissue disease on today's study. 4. Right thyroid nodule seen previously has decreased in the interval.     06/16/2018 Imaging   CT LUMBAR SPINE WO CONTRAST 06/16/18 IMPRESSION: 1. Transitional lumbosacral anatomy. Correlation with radiographs is recommended prior to any operative intervention. 2. Suspicion of a right L3 vertebral body metastasis on the April PET-CT is not evident by CT. Stable degenerative versus metastatic lesion at the right L5 superior endplate. Small but increased right iliac bone metastasis since March. Lumbar MRI would best characterize lumbar metastatic disease (without contrast if necessary). 3. Suspect L2-L3 left subarticular segment disc herniation. Query left L2 radiculitis. 4. Stable appearance of rightward L4-L5 disc degeneration which could affect the right foramen and right lateral recess.   06/17/2018 - 11/16/2018 Chemotherapy   Restart Taxol 2 weeks on/1 week off with Herceptin and Perjeta q3weeks on 06/17/18. Perjeta was stopped after C5 due to diarrhea. Stopped Taxol after 11/16/18 due to disease progression to L3.    07/06/2018 PET scan   PET  IMPRESSION: 1. Near total resolution of hypermetabolic  activity associated with skeletal metastasis. Very mild activity associated with the LEFT iliac bone which is similar to background blood pool activity. 2. No new or progressive disease.   11/21/2018 PET scan   IMPRESSION: 1. New  focal hypermetabolic lesion eccentric to the right in the L3 vertebral body, maximum SUV 6.1 compatible with a new focus of active malignancy. 2. Very low-grade activity similar to blood pool in previous existing mildly sclerotic metastatic lesions including the left iliac bone lesion. Some of the mildly sclerotic lesions are not hypermetabolic compatible with effectively treated bony metastatic disease. 3. No hypermetabolic soft tissue lesions are identified.     11/30/2018 - 08/25/2019 Chemotherapy   Continue Herceptin q3weeks. Stopped 08/25/19 due to significant disease progression and switched to  IV Matuzumab   12/12/2018 - 12/17/2018 Chemotherapy   Tucatinib (TUKYSA) 6 tabs BID as a next line therapy starting 12/12/18. Stopped 12/17/18 duet o N&V, poor appetite and HA    12/21/2018 - 03/17/2019 Chemotherapy   IV Eribulin on day 1, 8 every 21 days starting 12/21/18. Stopped 03/17/19 due to mixed response to treatment.    03/21/2019 PET scan   IMPRESSION: 1. Mixed response to therapy of osseous metastasis. Although a right-sided L4 lesion demonstrates decreased hypermetabolism today, new left iliac and right ischial hypermetabolic lesions are seen. 2. No evidence of hypermetabolic soft tissue metastasis.     03/27/2019 - 07/21/2019 Chemotherapy   Neratinib (Nerlynx) titrate up from 134m to 2418mstarting 03/27/19. She could not tolerate 16046mnd stopped on 02/05/19 due to N&V and diarrhea. Will restart at 120m56m 04/10/18-04/27/19 and 4/19-4/22. Reduced to 80mg47me daily on 05/10/19. Held/stopped since 07/21/19 due to dry cough, Nausea and body aches.   06/23/2019 Imaging   CT Head  IMPRESSION: 1. No acute intracranial abnormality or metastatic disease identified. 2. Chronic white matter disease in the right hemisphere is stable from prior exams.     09/05/2019 PET scan   IMPRESSION: 1. Significant progression of widespread hypermetabolic osseous metastases throughout the axial skeleton  as detailed. 2. No hypermetabolic extraosseous sites of metastatic disease.   09/15/2019 - 12/22/2019 Chemotherapy   -IV Margetuximab q3weeks and IV Gemcitabine 2 weeks on/1 week off starting 09/15/19. Carboplatin added to Gemcitabine starting with C3 on 11/03/19. D/c after C5 due to disease progression in bone.    12/13/2019 Imaging   PET  IMPRESSION: 1. Interval progression of widespread, FDG avid osseous metastasis involving the axial skeleton. 2. No FDG avid extra osseous sites of metastatic disease.     01/19/2020 -  Chemotherapy   -Xeloda BID 2 weeks on/1 week off and Herceptin q3weeks starting 01/19/20. C1 at 1000mg 26m C2 increased to 1500mg i61me AM and 1000mg in56m PM starting 02/12/20.   04/09/2020 Imaging   CT CAP  IMPRESSION: 1. New 3 mm RIGHT upper lobe nodule, nonspecific, attention on follow-up. 2. What may represent an incomplete periprosthetic fracture about the cephalo medullary rod in the LEFT proximal femur. This linear band of incomplete bony fusion that is more pronounced than on previous CT imaging that was acquired in 2017 and may represent a stress type fracture about hardware. Correlate with any pain in this area. Sclerotic margins show chronic features but the linear band passing through the bone around the intramedullary rod is more extensive, involving approximately 270 degrees of the circumference of the femur, medial cortex remains intact. 3. Other findings of bony metastatic disease are similar to prior imaging. Sclerosis and some periosteal reaction  about a nondisplaced pathologic fracture of the LEFT lateral seventh rib. 4. Small hypervascular focus in the RIGHT hepatic lobe, indistinct but new compared to previous imaging. Perhaps this represents a transient phenomenon though is not imaged on the later phase to allow for confirmation. Liver MRI may be helpful for further evaluation, to exclude underlying metastatic lesion as clinically warranted. 5.  Hepatic steatosis. 6. Aortic atherosclerosis.   Aortic Atherosclerosis (ICD10-I70.0).     07/18/2020 Imaging   PET  IMPRESSION: No signs of visceral or nodal metastatic disease.   Increased number of hypermetabolic lesions with increased intensity of many of these areas and increased "size" of affected areas based on the regions of FDG uptake. While there are areas that do show diminished FDG uptake such as in the RIGHT scapula on balance the number and size of these areas has increased.   08/02/2020 - 10/11/2020 Chemotherapy   Patient is on Treatment Plan : BREAST Adjuvant CMF IV q21d     10/31/2020 PET scan   IMPRESSION: Interval worsening of disease with new areas of increased metabolic activity associated with LEFT hilar and infrahilar lymph nodes and on balance worsening of hypermetabolic areas within the spine, pelvis and appendicular skeleton as discussed.   Of particular note is the marked interval worsening of disease in the RIGHT proximal femur, no current visible lesion other than subtle sclerosis is noted in this area, attention to this and other substantial load bearing areas on subsequent imaging.   No additional sites of new disease or visceral involvement.   11/01/2020 -  Chemotherapy   Patient is on Treatment Plan : BREAST METASTATIC Trastuzumab D1 + Vinorelbine D1,8 (25) q21d       Current Therapy: vinorelbine last received 11/29/20 and trastuzumab-dkst last received 11/22/20, radiation.  Interval history- Azzure C. Bahner is a 55 yo female with history of breast cancer as listed above presenting to [facility] today with chief complaint of nausea and vomiting as well as pain in her right hip. Patient states pain has been present x 2 days. She reports pain is excruciating. Pain is located in her right hip and does not radiate. Pain is constant. She has been taking dilaudid and MS contin for pain with minimal relief. She reports near falls because of the pain. She thinks the  nausea and vomiting she is experiencing is related to the pain as well. She admits to 2 episodes of NBNB emesis today. She has not take a dose of pain medication PTA to clinic today. She states she has had similar pain in the past however it was in her left hip at that time. The pain was caused by her cancer spreading she tells me. She denies any sick contacts. Denies fever, chills, chest pain, shortness of breath, abdominal pain, urinary symptoms, diarrhea, rash.    An ASL interpreter was present during the history-taking and subsequent discussion (and for part of the physical exam) with this patient.     ROS  All other systems are reviewed and are negative for acute change except as noted in the HPI.    Allergies  Allergen Reactions   Promethazine Hcl Shortness Of Breath, Itching and Other (See Comments)    Skin redness, also  NOTE: Pt tolerates PO Compazine   Chlorhexidine Itching   Diphenhydramine Hcl Itching and Rash     Past Medical History:  Diagnosis Date   Abnormal Pap smear    Anxiety    Breast cancer ([place])    Cancer ([place])  1999   breast-s/p mastectomy, chemo, rad   CIN I (cervical intraepithelial neoplasia I) 2003   by colpo   Congenital deafness    Deaf    Depression    Port-A-Cath in place 2017   for chemotherapy   Radiation 07/12/15-07/26/15   left femur 30 Gy   S/P bilateral oophorectomy      Past Surgical History:  Procedure Laterality Date   ABDOMINAL HYSTERECTOMY     INTRAMEDULLARY (IM) NAIL INTERTROCHANTERIC Left 06/26/2015   Procedure: LEFT BIOMET LONG AFFIXS NAIL;  Surgeon: [name], MD;  Location: [facility];  Service: Orthopedics;  Laterality: Left;   IR GENERIC HISTORICAL  08/06/2015   IR US GUIDE VASC ACCESS LEFT 08/06/2015 Aletta Edouard, MD [facility]   IR GENERIC HISTORICAL  08/06/2015   IR FLUORO GUIDE CV LINE LEFT 08/06/2015 Aletta Edouard, MD [facility]   IR GENERIC HISTORICAL  03/05/2016   IR CV LINE INJECTION 03/05/2016 Markus Daft, MD  [facility]   LEFT HEART CATH AND CORONARY ANGIOGRAPHY N/A 12/13/2017   Procedure: LEFT HEART CATH AND CORONARY ANGIOGRAPHY;  Surgeon: Leonie Man, MD;  Location: [facility];  Service: Cardiovascular;  Laterality: N/A;   MASTECTOMY     right breast   MASTECTOMY     OVARIAN CYST REMOVAL     RADIOLOGY WITH ANESTHESIA Left 06/25/2015   Procedure: MRI OF LEFT HIP WITH OR WITHOUT CONTRAST;  Surgeon: Medication Radiologist, MD;  Location: [facility];  Service: Radiology;  Laterality: Left;  DR. MCINTYRE/MRI   TUBAL LIGATION      Social History   Socioeconomic History   Marital status: Married    Spouse name: Not on file   Number of children: 2   Years of education: Not on file   Highest education level: Not on file  Occupational History   Not on file  Tobacco Use   Smoking status: Never   Smokeless tobacco: Never  Substance and Sexual Activity   Alcohol use: No   Drug use: No   Sexual activity: Yes    Birth control/protection: Surgical  Other Topics Concern   Not on file  Social History Narrative   Not on file   Social Determinants of Health   Financial Resource Strain: Not on file  Food Insecurity: Not on file  Transportation Needs: Not on file  Physical Activity: Not on file  Stress: Not on file  Social Connections: Not on file  Intimate Partner Violence: Not on file    Family History  Problem Relation Age of Onset   Hypertension Mother    Seizures Mother    Alzheimer's disease Maternal Uncle    Pancreatitis Maternal Grandmother    Heart attack Maternal Grandfather      Current Outpatient Medications:    dexamethasone (DECADRON) 4 MG tablet, Take 1 tablet (4 mg total) by mouth daily. For 3-5 days after chemo (Patient not taking: No sig reported), Disp: 30 tablet, Rfl: 0   HYDROmorphone (DILAUDID) 2 MG tablet, Take 1-2 tablets (2-4 mg total) by mouth every 4 (four) hours as needed for severe pain., Disp: 30 tablet, Rfl: 0   lidocaine-prilocaine (EMLA)  cream, Apply 1 application topically as needed. Apply to Porta-Cath 1-2 hours prior to access as directed., Disp: 90 g, Rfl: 2   LORazepam (ATIVAN) 0.5 MG tablet, Take 1 tablet (0.5 mg total) by mouth every 8 (eight) hours as needed for anxiety (nausea). (Patient not taking: Reported on 11/20/2020), Disp: 20 tablet, Rfl:  0   morphine (MS CONTIN) 15 MG 12 hr tablet, Take 1 tablet (15 mg total) by mouth every 12 (twelve) hours., Disp: 30 tablet, Rfl: 0   potassium chloride (KLOR-CON) 10 MEQ tablet, Take 1 tablet (10 mEq total) by mouth 3 (three) times daily., Disp: 90 tablet, Rfl: 2   prochlorperazine (COMPAZINE) 10 MG tablet, TAKE 1 TABLET(10 MG) BY MOUTH EVERY 6 HOURS AS NEEDED FOR NAUSEA OR VOMITING, Disp: 60 tablet, Rfl: 1   zolpidem (AMBIEN) 10 MG tablet, Take 1 tablet (10 mg total) by mouth at bedtime as needed for sleep., Disp: 30 tablet, Rfl: 0 No current facility-administered medications for this visit.  Facility-Administered Medications Ordered in Other Visits:    potassium chloride SA (K-DUR) CR tablet 40 mEq, 40 mEq, Oral, Once, Truitt Merle, MD   sodium chloride 0.9 % 1,000 mL with potassium chloride 10 mEq infusion, , Intravenous, Continuous, Livesay, Lennis P, MD, Stopped at 09/10/15 1653   sodium chloride 0.9 % injection 10 mL, 10 mL, Intravenous, PRN, Livesay, Lennis P, MD  PHYSICAL EXAM: ECOG FS:1 - Symptomatic but completely ambulatory    Vitals:   12/12/20 1230  BP: (!) 106/55  Pulse: 81  Resp: 17  Temp: 98.2 F (36.8 C)  TempSrc: Temporal  SpO2: 99%  Weight: 126 lb 14.4 oz (57.6 kg)  Height: 5' 6"  (1.676 m)   Physical Exam Vitals and nursing note reviewed.  Constitutional:      General: She is not in acute distress.    Appearance: She is not ill-appearing.  HENT:     Head: Normocephalic and atraumatic.     Right Ear: Tympanic membrane and external ear normal.     Left Ear: Tympanic membrane and external ear normal.     Nose: Nose normal.     Mouth/Throat:      Mouth: Mucous membranes are moist.     Pharynx: Oropharynx is clear.  Eyes:     General: No scleral icterus.       Right eye: No discharge.        Left eye: No discharge.     Extraocular Movements: Extraocular movements intact.     Conjunctiva/sclera: Conjunctivae normal.     Pupils: Pupils are equal, round, and reactive to light.  Neck:     Vascular: No JVD.  Cardiovascular:     Rate and Rhythm: Normal rate and regular rhythm.     Pulses: Normal pulses.          Radial pulses are 2+ on the right side and 2+ on the left side.       Dorsalis pedis pulses are 2+ on the right side and 2+ on the left side.     Heart sounds: Normal heart sounds.  Pulmonary:     Comments: Lungs clear to auscultation in all fields. Symmetric chest rise. No wheezing, rales, or rhonchi. Chest:     Comments: Port in left upper chest without signs of infection.  Abdominal:     General: Bowel sounds are normal. There is no distension.     Palpations: Abdomen is soft. There is no mass.     Tenderness: There is no abdominal tenderness. There is no guarding or rebound.     Hernia: No hernia is present.     Comments: Abdomen is soft, non-distended, and non-tender in all quadrants. No rigidity, no guarding. No peritoneal signs.  Musculoskeletal:        General: No swelling or deformity. Normal range of motion.  Cervical back: Normal range of motion.     Right lower leg: No edema.     Left lower leg: No edema.     Comments: Tender to palpation of right HIP. Full passive ROM of right hip with pain. No overlying skin changes. Patient ambulatory with cain. Compartments are soft in all extremities.  Skin:    General: Skin is warm and dry.     Capillary Refill: Capillary refill takes less than 2 seconds.     Findings: No rash.     Comments: Equal tactile temperature in all extremities  Neurological:     Mental Status: She is oriented to person, place, and time.     GCS: GCS eye subscore is 4. GCS verbal subscore  is 5. GCS motor subscore is 6.     Comments: no facial droop.  Psychiatric:        Behavior: Behavior normal.       LABORATORY DATA: I have reviewed the data as listed CBC Latest Ref Rng & Units 12/12/2020 11/29/2020 11/22/2020  WBC 4.0 - 10.5 K/uL 2.5(L) 4.0 2.2(L)  Hemoglobin 12.0 - 15.0 g/dL 8.3(L) 8.0(L) 8.3(L)  Hematocrit 36.0 - 46.0 % 24.7(L) 24.9(L) 26.1(L)  Platelets 150 - 400 K/uL 186 204 226     CMP Latest Ref Rng & Units 12/12/2020 11/29/2020 11/22/2020  Glucose 70 - 99 mg/dL 109(H) 136(H) 135(H)  BUN 6 - 20 mg/dL 7 8 7   Creatinine 0.44 - 1.00 mg/dL 0.81 0.96 0.86  Sodium 135 - 145 mmol/L 138 138 141  Potassium 3.5 - 5.1 mmol/L 3.6 3.1(L) 3.3(L)  Chloride 98 - 111 mmol/L 103 105 106  CO2 22 - 32 mmol/L 25 21(L) 23  Calcium 8.9 - 10.3 mg/dL 9.4 9.2 8.8(L)  Total Protein 6.5 - 8.1 g/dL 6.6 6.5 6.2(L)  Total Bilirubin 0.3 - 1.2 mg/dL 1.8(H) 1.2 1.1  Alkaline Phos 38 - 126 U/L 87 82 77  AST 15 - 41 U/L 22 20 19   ALT 0 - 44 U/L 14 16 13        RADIOGRAPHIC STUDIES: I have personally reviewed the radiological images as listed and agreed with the findings in the report. No images are attached to the encounter. DG FEMUR, MIN 2 VIEWS RIGHT  Result Date: 12/12/2020 CLINICAL DATA:  Pain.  Carcinoma of the breast with bone metastases. EXAM: RIGHT FEMUR 2 VIEWS COMPARISON:  PET-CT 10/31/2020 FINDINGS: No acute fracture or dislocation identified. Sclerosis involving the right inferior pubic rami corresponds to sclerotic bone metastases noted on previous PET-CT. Within the intertrochanteric portions of the proximal right femur there is a 3.4 cm sclerotic metastasis which corresponds to an area of increased FDG uptake on recent PET-CT. IMPRESSION: 1. Sclerotic metastasis is identified within the proximal right femur and right inferior pubic rami. No signs of pathologic fracture. Electronically Signed   By: Kerby Moors M.D.   On: 12/12/2020 11:43     ASSESSMENT & PLAN: Patient  is a 55 y.o. 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 Burr Medico and radiation oncologist Dr. Sondra Come.   #) Right hip pain- patient afebrile, non toxic appearing. She has tenderness to palpation of the hip without deformity, skin changes or external exam findings. Xray was ordered by rad onc prior to patient's arrival to clinic. Xray shows sclerotic metastasis in proximal right femur and right inferor pubic rami, no signs of fracture. Findings are similar to PET CT 10/31/20. Pain improved after  IV morphine.   #)Nausea and vomiting- symptoms likely related to uncontrolled pain. Abdominal exam is benign. Patient given zofran and IVF here. Labs checked today. CBC with leukopenia 2.5, hemoglobin consistent with baseline, ANC 1.7, normal platelets. CMP without severe electrolyte derangement, no renal insufficiency, no transaminitis. Magnesium normal. With serial negative abdominal exams do not feel emergent imaging is needed at the time, patient agrees with plan for symptom control. Patient tolerating PO intake after interventions here.  #)Metastatic right breast cancer- continue treatment medical and radiation oncologists. Per Dr. Luretha Rued patient to resume radiation Monday, with hope that will help with hip pain as well.   Visit Diagnosis: 1. Right hip pain   2. Nausea and vomiting, unspecified vomiting type   3. [name], unspecified laterality ([place])      No orders of the defined types were placed in this encounter.   All questions were answered. The patient knows to call the clinic with any problems, questions or concerns. No barriers to learning was detected.  I have spent a total of 30 minutes minutes of face-to-face and non-face-to-face time, preparing to see the patient, obtaining and/or reviewing separately obtained history, performing a medically appropriate examination, counseling and educating the patient,  ordering tests,  documenting clinical information in the electronic health record, and care coordination.     Thank you for allowing me to participate in the care of this patient.    Barrie Folk, [name] Department of Hematology/Oncology [facility] at [facility] Phone: [phone]  [phone]    12/12/2020 3:44 PM

## 2020-12-13 ENCOUNTER — Other Ambulatory Visit: Payer: Medicaid Other

## 2020-12-13 ENCOUNTER — Ambulatory Visit: Payer: Medicaid Other

## 2020-12-13 ENCOUNTER — Ambulatory Visit: Payer: Medicaid Other | Admitting: Hematology

## 2020-12-13 LAB — CANCER ANTIGEN 27.29: CA 27.29: 155.5 U/mL — ABNORMAL HIGH (ref 0.0–38.6)

## 2020-12-16 ENCOUNTER — Ambulatory Visit: Payer: Medicaid Other

## 2020-12-17 ENCOUNTER — Ambulatory Visit
Admission: RE | Admit: 2020-12-17 | Discharge: 2020-12-17 | Disposition: A | Discharge status: Home / Self Care | Payer: Medicaid Other | Source: Ambulatory Visit | Attending: Radiation Oncology | Admitting: Radiation Oncology

## 2020-12-17 ENCOUNTER — Other Ambulatory Visit: Payer: Self-pay

## 2020-12-17 ENCOUNTER — Ambulatory Visit: Payer: Medicaid Other

## 2020-12-17 DIAGNOSIS — C50911 Malignant neoplasm of unspecified site of right female breast: Secondary | ICD-10-CM | POA: Diagnosis not present

## 2020-12-17 DIAGNOSIS — C7951 Secondary malignant neoplasm of bone: Secondary | ICD-10-CM | POA: Diagnosis not present

## 2020-12-17 DIAGNOSIS — Z5112 Encounter for antineoplastic immunotherapy: Secondary | ICD-10-CM | POA: Diagnosis not present

## 2020-12-18 ENCOUNTER — Ambulatory Visit
Admission: RE | Admit: 2020-12-18 | Discharge: 2020-12-18 | Disposition: A | Discharge status: Home / Self Care | Payer: Medicaid Other | Source: Ambulatory Visit | Attending: Radiation Oncology | Admitting: Radiation Oncology

## 2020-12-18 DIAGNOSIS — C50911 Malignant neoplasm of unspecified site of right female breast: Secondary | ICD-10-CM | POA: Diagnosis not present

## 2020-12-18 DIAGNOSIS — Z5112 Encounter for antineoplastic immunotherapy: Secondary | ICD-10-CM | POA: Diagnosis not present

## 2020-12-18 DIAGNOSIS — C7951 Secondary malignant neoplasm of bone: Secondary | ICD-10-CM | POA: Diagnosis not present

## 2020-12-19 ENCOUNTER — Other Ambulatory Visit: Payer: Self-pay

## 2020-12-19 ENCOUNTER — Ambulatory Visit
Admission: RE | Admit: 2020-12-19 | Discharge: 2020-12-19 | Disposition: A | Discharge status: Home / Self Care | Payer: Medicaid Other | Source: Ambulatory Visit | Attending: Radiation Oncology | Admitting: Radiation Oncology

## 2020-12-19 ENCOUNTER — Ambulatory Visit: Payer: Medicaid Other

## 2020-12-19 DIAGNOSIS — Z5112 Encounter for antineoplastic immunotherapy: Secondary | ICD-10-CM | POA: Diagnosis not present

## 2020-12-19 DIAGNOSIS — C50911 Malignant neoplasm of unspecified site of right female breast: Secondary | ICD-10-CM | POA: Diagnosis not present

## 2020-12-19 DIAGNOSIS — C7951 Secondary malignant neoplasm of bone: Secondary | ICD-10-CM | POA: Diagnosis not present

## 2020-12-20 ENCOUNTER — Other Ambulatory Visit: Payer: Medicaid Other

## 2020-12-20 ENCOUNTER — Ambulatory Visit: Payer: Medicaid Other

## 2020-12-20 ENCOUNTER — Ambulatory Visit
Admission: RE | Admit: 2020-12-20 | Discharge: 2020-12-20 | Disposition: A | Discharge status: Home / Self Care | Payer: Medicaid Other | Source: Ambulatory Visit | Attending: Radiation Oncology | Admitting: Radiation Oncology

## 2020-12-20 DIAGNOSIS — Z5112 Encounter for antineoplastic immunotherapy: Secondary | ICD-10-CM | POA: Diagnosis not present

## 2020-12-20 DIAGNOSIS — C7951 Secondary malignant neoplasm of bone: Secondary | ICD-10-CM | POA: Diagnosis not present

## 2020-12-20 DIAGNOSIS — C50911 Malignant neoplasm of unspecified site of right female breast: Secondary | ICD-10-CM | POA: Diagnosis not present

## 2020-12-23 ENCOUNTER — Ambulatory Visit: Payer: Medicaid Other

## 2020-12-23 ENCOUNTER — Other Ambulatory Visit: Payer: Self-pay

## 2020-12-23 ENCOUNTER — Ambulatory Visit
Admission: RE | Admit: 2020-12-23 | Discharge: 2020-12-23 | Disposition: A | Discharge status: Home / Self Care | Payer: Medicaid Other | Source: Ambulatory Visit | Attending: Radiation Oncology | Admitting: Radiation Oncology

## 2020-12-23 DIAGNOSIS — C50911 Malignant neoplasm of unspecified site of right female breast: Secondary | ICD-10-CM | POA: Diagnosis not present

## 2020-12-23 DIAGNOSIS — C7951 Secondary malignant neoplasm of bone: Secondary | ICD-10-CM | POA: Diagnosis not present

## 2020-12-23 DIAGNOSIS — Z5112 Encounter for antineoplastic immunotherapy: Secondary | ICD-10-CM | POA: Diagnosis not present

## 2020-12-24 ENCOUNTER — Ambulatory Visit
Admission: RE | Admit: 2020-12-24 | Discharge: 2020-12-24 | Disposition: A | Discharge status: Home / Self Care | Payer: Medicaid Other | Source: Ambulatory Visit | Attending: Radiation Oncology | Admitting: Radiation Oncology

## 2020-12-24 DIAGNOSIS — C7951 Secondary malignant neoplasm of bone: Secondary | ICD-10-CM | POA: Diagnosis not present

## 2020-12-24 DIAGNOSIS — Z5112 Encounter for antineoplastic immunotherapy: Secondary | ICD-10-CM | POA: Diagnosis not present

## 2020-12-24 DIAGNOSIS — C50911 Malignant neoplasm of unspecified site of right female breast: Secondary | ICD-10-CM | POA: Diagnosis not present

## 2020-12-25 ENCOUNTER — Encounter: Payer: Self-pay | Admitting: Radiation Oncology

## 2020-12-25 ENCOUNTER — Ambulatory Visit
Admission: RE | Admit: 2020-12-25 | Discharge: 2020-12-25 | Disposition: A | Discharge status: Home / Self Care | Payer: Medicaid Other | Source: Ambulatory Visit | Attending: Radiation Oncology | Admitting: Radiation Oncology

## 2020-12-25 ENCOUNTER — Other Ambulatory Visit: Payer: Self-pay

## 2020-12-25 DIAGNOSIS — Z5112 Encounter for antineoplastic immunotherapy: Secondary | ICD-10-CM | POA: Diagnosis not present

## 2020-12-25 DIAGNOSIS — C7951 Secondary malignant neoplasm of bone: Secondary | ICD-10-CM | POA: Diagnosis not present

## 2020-12-25 DIAGNOSIS — C50911 Malignant neoplasm of unspecified site of right female breast: Secondary | ICD-10-CM | POA: Diagnosis not present

## 2020-12-27 ENCOUNTER — Inpatient Hospital Stay: Payer: Medicaid Other

## 2020-12-27 ENCOUNTER — Other Ambulatory Visit: Payer: Self-pay

## 2020-12-27 ENCOUNTER — Inpatient Hospital Stay (HOSPITAL_BASED_OUTPATIENT_CLINIC_OR_DEPARTMENT_OTHER): Payer: Medicaid Other | Admitting: Nurse Practitioner

## 2020-12-27 ENCOUNTER — Encounter: Payer: Self-pay | Admitting: Hematology

## 2020-12-27 ENCOUNTER — Inpatient Hospital Stay (HOSPITAL_BASED_OUTPATIENT_CLINIC_OR_DEPARTMENT_OTHER): Payer: Medicaid Other | Admitting: Hematology

## 2020-12-27 VITALS — BP 127/67 | HR 70 | Temp 97.9°F | Resp 16 | Wt 127.5 lb

## 2020-12-27 DIAGNOSIS — C7951 Secondary malignant neoplasm of bone: Secondary | ICD-10-CM

## 2020-12-27 DIAGNOSIS — Z5112 Encounter for antineoplastic immunotherapy: Secondary | ICD-10-CM | POA: Diagnosis not present

## 2020-12-27 DIAGNOSIS — C50911 Malignant neoplasm of unspecified site of right female breast: Secondary | ICD-10-CM

## 2020-12-27 DIAGNOSIS — G893 Neoplasm related pain (acute) (chronic): Secondary | ICD-10-CM | POA: Diagnosis not present

## 2020-12-27 DIAGNOSIS — R531 Weakness: Secondary | ICD-10-CM

## 2020-12-27 DIAGNOSIS — Z515 Encounter for palliative care: Secondary | ICD-10-CM | POA: Diagnosis not present

## 2020-12-27 DIAGNOSIS — Z7189 Other specified counseling: Secondary | ICD-10-CM

## 2020-12-27 DIAGNOSIS — R530 Neoplastic (malignant) related fatigue: Secondary | ICD-10-CM

## 2020-12-27 DIAGNOSIS — Z95828 Presence of other vascular implants and grafts: Secondary | ICD-10-CM

## 2020-12-27 DIAGNOSIS — R110 Nausea: Secondary | ICD-10-CM

## 2020-12-27 LAB — CBC WITH DIFFERENTIAL (CANCER CENTER ONLY)
Abs Immature Granulocytes: 0.02 10*3/uL (ref 0.00–0.07)
Basophils Absolute: 0 10*3/uL (ref 0.0–0.1)
Basophils Relative: 1 %
Eosinophils Absolute: 0.1 10*3/uL (ref 0.0–0.5)
Eosinophils Relative: 4 %
HCT: 26.9 % — ABNORMAL LOW (ref 36.0–46.0)
Hemoglobin: 8.9 g/dL — ABNORMAL LOW (ref 12.0–15.0)
Immature Granulocytes: 1 %
Lymphocytes Relative: 12 %
Lymphs Abs: 0.4 10*3/uL — ABNORMAL LOW (ref 0.7–4.0)
MCH: 29.5 pg (ref 26.0–34.0)
MCHC: 33.1 g/dL (ref 30.0–36.0)
MCV: 89.1 fL (ref 80.0–100.0)
Monocytes Absolute: 0.3 10*3/uL (ref 0.1–1.0)
Monocytes Relative: 10 %
Neutro Abs: 2.5 10*3/uL (ref 1.7–7.7)
Neutrophils Relative %: 72 %
Platelet Count: 155 10*3/uL (ref 150–400)
RBC: 3.02 MIL/uL — ABNORMAL LOW (ref 3.87–5.11)
RDW: 17.1 % — ABNORMAL HIGH (ref 11.5–15.5)
WBC Count: 3.4 10*3/uL — ABNORMAL LOW (ref 4.0–10.5)
nRBC: 0 % (ref 0.0–0.2)

## 2020-12-27 LAB — CMP (CANCER CENTER ONLY)
ALT: 12 U/L (ref 0–44)
AST: 21 U/L (ref 15–41)
Albumin: 3.4 g/dL — ABNORMAL LOW (ref 3.5–5.0)
Alkaline Phosphatase: 70 U/L (ref 38–126)
Anion gap: 12 (ref 5–15)
BUN: 6 mg/dL (ref 6–20)
CO2: 24 mmol/L (ref 22–32)
Calcium: 8.8 mg/dL — ABNORMAL LOW (ref 8.9–10.3)
Chloride: 106 mmol/L (ref 98–111)
Creatinine: 0.8 mg/dL (ref 0.44–1.00)
GFR, Estimated: >60 mL/min (ref >60)
Glucose, Bld: 96 mg/dL (ref 70–99)
Potassium: 2.8 mmol/L — ABNORMAL LOW (ref 3.5–5.1)
Sodium: 142 mmol/L (ref 135–145)
Total Bilirubin: 1.4 mg/dL — ABNORMAL HIGH (ref 0.3–1.2)
Total Protein: 6.4 g/dL — ABNORMAL LOW (ref 6.5–8.1)

## 2020-12-27 MED ORDER — POTASSIUM CHLORIDE CRYS ER 20 MEQ PO TBCR
20.0000 meq | EXTENDED_RELEASE_TABLET | Freq: Once | ORAL | Status: AC
  Administered 2020-12-27: 20 meq via ORAL

## 2020-12-27 MED ORDER — SODIUM CHLORIDE 0.9% FLUSH
10.0000 mL | INTRAVENOUS | Status: DC | PRN
  Administered 2020-12-27: 10 mL

## 2020-12-27 MED ORDER — VINORELBINE TARTRATE CHEMO INJECTION 50 MG/5ML
18.0000 mg/m2 | Freq: Once | INTRAVENOUS | Status: AC
  Administered 2020-12-27: 30 mg via INTRAVENOUS
  Filled 2020-12-27: qty 3

## 2020-12-27 MED ORDER — ACETAMINOPHEN 325 MG PO TABS
650.0000 mg | ORAL_TABLET | Freq: Once | ORAL | Status: AC
  Administered 2020-12-27: 650 mg via ORAL
  Filled 2020-12-27: qty 2

## 2020-12-27 MED ORDER — SODIUM CHLORIDE 0.9 % IV SOLN: Freq: Once | INTRAVENOUS | Status: AC

## 2020-12-27 MED ORDER — POTASSIUM CHLORIDE 10 MEQ/100ML IV SOLN
10.0000 meq | INTRAVENOUS | Status: AC
  Administered 2020-12-27: 10 meq via INTRAVENOUS

## 2020-12-27 MED ORDER — HYDROMORPHONE HCL 2 MG PO TABS: 2.0000 mg | ORAL_TABLET | Freq: Four times a day (QID) | ORAL | 0 refills | Status: DC | PRN

## 2020-12-27 MED ORDER — LORATADINE 10 MG PO TABS
10.0000 mg | ORAL_TABLET | Freq: Every day | ORAL | Status: DC
  Administered 2020-12-27: 10 mg via ORAL
  Filled 2020-12-27: qty 1

## 2020-12-27 MED ORDER — TRASTUZUMAB-DKST CHEMO 150 MG IV SOLR
6.0000 mg/kg | Freq: Once | INTRAVENOUS | Status: AC
  Administered 2020-12-27: 357 mg via INTRAVENOUS
  Filled 2020-12-27: qty 17

## 2020-12-27 MED ORDER — HEPARIN SOD (PORK) LOCK FLUSH 100 UNIT/ML IV SOLN
500.0000 [IU] | Freq: Once | INTRAVENOUS | Status: AC | PRN
  Administered 2020-12-27: 500 [IU]

## 2020-12-27 MED ORDER — POTASSIUM CHLORIDE 10 MEQ/100ML IV SOLN
INTRAVENOUS | Status: AC
  Filled 2020-12-27: qty 200

## 2020-12-27 MED ORDER — PROCHLORPERAZINE MALEATE 10 MG PO TABS
10.0000 mg | ORAL_TABLET | Freq: Once | ORAL | Status: AC
  Administered 2020-12-27: 10 mg via ORAL
  Filled 2020-12-27: qty 1

## 2020-12-27 MED ORDER — MEGESTROL ACETATE 625 MG/5ML PO SUSP: 625.0000 mg | Freq: Every day | ORAL | 0 refills | Status: DC

## 2020-12-27 MED ORDER — SODIUM CHLORIDE 0.9% FLUSH
10.0000 mL | Freq: Once | INTRAVENOUS | Status: AC
  Administered 2020-12-27: 10 mL

## 2020-12-27 MED ORDER — MORPHINE SULFATE ER 15 MG PO TBCR: 15.0000 mg | EXTENDED_RELEASE_TABLET | Freq: Two times a day (BID) | ORAL | 0 refills | Status: DC

## 2020-12-27 NOTE — Patient Instructions (Addendum)
[facility]  Discharge Instructions: Thank you for choosing [facility] to provide your oncology and hematology care.   If you have a lab appointment with the [facility], please go directly to the [facility] and check in at the registration area.   Wear comfortable clothing and clothing appropriate for easy access to any Portacath or PICC line.   We strive to give you quality time with your provider. You may need to reschedule your appointment if you arrive late (15 or more minutes).  Arriving late affects you and other patients whose appointments are after yours.  Also, if you miss three or more appointments without notifying the office, you may be dismissed from the clinic at the providers discretion.      For prescription refill requests, have your pharmacy contact our office and allow 72 hours for refills to be completed.    Today you received the following chemotherapy and/or immunotherapy agent: Trastuzumab and Vinorelbine (Navelbine).   To help prevent nausea and vomiting after your treatment, we encourage you to take your nausea medication as directed.  BELOW ARE SYMPTOMS THAT SHOULD BE REPORTED IMMEDIATELY: *FEVER GREATER THAN 100.4 F (38 C) OR HIGHER *CHILLS OR SWEATING *NAUSEA AND VOMITING THAT IS NOT CONTROLLED WITH YOUR NAUSEA MEDICATION *UNUSUAL SHORTNESS OF BREATH *UNUSUAL BRUISING OR BLEEDING *URINARY PROBLEMS (pain or burning when urinating, or frequent urination) *BOWEL PROBLEMS (unusual diarrhea, constipation, pain near the anus) TENDERNESS IN MOUTH AND THROAT WITH OR WITHOUT PRESENCE OF ULCERS (sore throat, sores in mouth, or a toothache) UNUSUAL RASH, SWELLING OR PAIN  UNUSUAL VAGINAL DISCHARGE OR ITCHING   Items with * indicate a potential emergency and should be followed up as soon as possible or go to the Emergency Department if any problems should occur.  Please show the CHEMOTHERAPY ALERT CARD or IMMUNOTHERAPY  ALERT CARD at check-in to the Emergency Department and triage nurse.  Should you have questions after your visit or need to cancel or reschedule your appointment, please contact [facility]  Dept: [phone]  and follow the prompts.  Office hours are 8:00 a.m. to 4:30 p.m. Monday - Friday. Please note that voicemails left after 4:00 p.m. may not be returned until the following business day.  We are closed weekends and major holidays. You have access to a nurse at all times for urgent questions. Please call the main number to the clinic Dept: [phone] and follow the prompts.   For any non-urgent questions, you may also contact your provider using MyChart. We now offer e-Visits for anyone 31 and older to request care online for non-urgent symptoms. For details visit [email or web address].   Also download the MyChart app! Go to the app store, search "MyChart", open the app, select [facility], and log in with your MyChart username and password.  Due to Covid, a mask is required upon entering the hospital/clinic. If you do not have a mask, one will be given to you upon arrival. For doctor visits, patients may have 1 support person aged 9 or older with them. For treatment visits, patients cannot have anyone with them due to current Covid guidelines and our immunocompromised population.   Hypokalemia Hypokalemia means that the amount of potassium in the blood is lower than normal. Potassium is a chemical (electrolyte) that helps regulate the amount of fluid in the body. It also stimulates muscle tightening (contraction) and helps nerves work properly. Normally, most of the body's potassium is inside  cells, and only a very small amount is in the blood. Because the amount in the blood is so small, minor changes to potassium levels in the blood can be life-threatening. What are the causes? This condition may be caused by: Antibiotic medicine. Diarrhea or vomiting.  Taking too much of a medicine that helps you have a bowel movement (laxative) can cause diarrhea and lead to hypokalemia. Chronic kidney disease (CKD). Medicines that help the body get rid of excess fluid (diuretics). Eating disorders, such as bulimia. Low magnesium levels in the body. Sweating a lot. What are the signs or symptoms? Symptoms of this condition include: Weakness. Constipation. Fatigue. Muscle cramps. Mental confusion. Skipped heartbeats or irregular heartbeat (palpitations). Tingling or numbness. How is this diagnosed? This condition is diagnosed with a blood test. How is this treated? This condition may be treated by: Taking potassium supplements by mouth. Adjusting the medicines that you take. Eating more foods that contain a lot of potassium. If your potassium level is very low, you may need to get potassium through an IV and be monitored in the hospital. Follow these instructions at home:  Take over-the-counter and prescription medicines only as told by your health care provider. This includes vitamins and supplements. Eat a healthy diet. A healthy diet includes fresh fruits and vegetables, whole grains, healthy fats, and lean proteins. If instructed, eat more foods that contain a lot of potassium. This includes: Nuts, such as peanuts and pistachios. Seeds, such as sunflower seeds and pumpkin seeds. Peas, lentils, and lima beans. Whole grain and bran cereals and breads. Fresh fruits and vegetables, such as apricots, avocado, bananas, cantaloupe, kiwi, oranges, tomatoes, asparagus, and potatoes. Orange juice. Tomato juice. Red meats. Yogurt. Keep all follow-up visits as told by your health care provider. This is important. Contact a health care provider if you: Have weakness that gets worse. Feel your heart pounding or racing. Vomit. Have diarrhea. Have diabetes (diabetes mellitus) and you have trouble keeping your blood sugar (glucose) in your target  range. Get help right away if you: Have chest pain. Have shortness of breath. Have vomiting or diarrhea that lasts for more than 2 days. Faint. Summary Hypokalemia means that the amount of potassium in the blood is lower than normal. This condition is diagnosed with a blood test. Hypokalemia may be treated by taking potassium supplements, adjusting the medicines that you take, or eating more foods that are high in potassium. If your potassium level is very low, you may need to get potassium through an IV and be monitored in the hospital. This information is not intended to replace advice given to you by your health care provider. Make sure you discuss any questions you have with your health care provider. Document Revised: 08/10/2017 Document Reviewed: 08/11/2017 Elsevier Patient Education  2022 [place].  Potassium Chloride Injection What is this medication? POTASSIUM CHLORIDE (poe TASS i um KLOOR ide) prevents and treats low levels of potassium in your body. Potassium plays an important role in maintaining the health of your kidneys, heart, muscles, and nervous system. This medicine may be used for other purposes; ask your health care provider or pharmacist if you have questions. COMMON BRAND NAME(S): PROAMP What should I tell my care team before I take this medication? They need to know if you have any of these conditions: Addison disease Dehydration Diabetes (high blood sugar) Heart disease High levels of potassium in the blood Irregular heartbeat or rhythm Kidney disease Large areas of burned skin An unusual or  allergic reaction to potassium, other medications, foods, dyes, or preservatives Pregnant or trying to get pregnant Breast-feeding How should I use this medication? This medication is injected into a vein. It is given in a hospital or clinic setting. Talk to your care team about the use of this medication in children. Special care may be needed. Overdosage: If you  think you have taken too much of this medicine contact a poison control center or emergency room at once. NOTE: This medicine is only for you. Do not share this medicine with others. What if I miss a dose? This does not apply. This medication is not for regular use. What may interact with this medication? Do not take this medication with any of the following: Certain diuretics such as spironolactone, triamterene Eplerenone Sodium polystyrene sulfonate This medication may also interact with the following: Certain medications for blood pressure or heart disease like lisinopril, losartan, quinapril, valsartan Medications that lower your chance of fighting infection such as cyclosporine, tacrolimus NSAIDs, medications for pain and inflammation, like ibuprofen or naproxen Other potassium supplements Salt substitutes This list may not describe all possible interactions. Give your health care provider a list of all the medicines, herbs, non-prescription drugs, or dietary supplements you use. Also tell them if you smoke, drink alcohol, or use illegal drugs. Some items may interact with your medicine. What should I watch for while using this medication? Visit your care team for regular checks on your progress. Tell your care team if your symptoms do not start to get better or if they get worse. You may need blood work while you are taking this medication. Avoid salt substitutes unless you are told otherwise by your care team. What side effects may I notice from receiving this medication? Side effects that you should report to your care team as soon as possible: Allergic reactions--skin rash, itching, hives, swelling of the face, lips, tongue, or throat High potassium level--muscle weakness, fast or irregular heartbeat Side effects that usually do not require medical attention (report to your care team if they continue or are bothersome): Diarrhea Nausea Stomach pain Vomiting This list may not  describe all possible side effects. Call your doctor for medical advice about side effects. You may report side effects to FDA at 1-800-FDA-1088. Where should I keep my medication? This medication is given in a hospital or clinic. It will not be stored at home. NOTE: This sheet is a summary. It may not cover all possible information. If you have questions about this medicine, talk to your doctor, pharmacist, or health care provider.  2022 Elsevier/Gold Standard (2020-04-16 00:00:00)

## 2020-12-27 NOTE — Progress Notes (Signed)
[place]  [phone] [phone]   Name: [name] Date: 12/27/2020 MRN: [id number]  DOB: [birth year]  Patient Care Team: [name], [name], [name] (Inactive) as PCP - General (Physician Assistant) [name], MD as Consulting Physician (Orthopedic Surgery) Pickenpack-Cousar, Carlena Sax, NP as Nurse Practitioner (Nurse Practitioner)    INTERVAL HISTORY: [name] is a 55 y.o. female with multiple medical problems including metastatic right breast (initial diagnosis 11/1997) s/p right mastectomy, chemotherapy, currently receiving vinorelbine, GERD, vertigo, congenital deafness, chemo-induced neuropathy, osteopenia, depression, anxiety, and cancer-related pain.  Palliative ask to see for symptom management and goals of care.    SOCIAL HISTORY:     reports that she has never smoked. She has never used smokeless tobacco. She reports that she does not drink alcohol and does not use drugs.  ADVANCE DIRECTIVES:  Patient does not have a completed advanced directive. She understands her husband, [name] is considered HCPOA in the absence of such documents. Is aware of advanced directives clinic if she would like for [place] to make an appointment.  CODE STATUS: DNR  PAST MEDICAL HISTORY: Past Medical History:  Diagnosis Date   Abnormal Pap smear    Anxiety    Breast cancer ([place])    Cancer ([place]) 1999   breast-s/p mastectomy, chemo, rad   CIN I (cervical intraepithelial neoplasia I) 2003   by colpo   Congenital deafness    Deaf    Depression    Port-A-Cath in place 2017   for chemotherapy   Radiation 07/12/15-07/26/15   left femur 30 Gy   S/P bilateral oophorectomy      HEMATOLOGY/ONCOLOGY HISTORY:  Oncology History Overview Note   Cancer Staging No matching staging information was found for the patient.     [name], right ([place])  11/1997 Cancer Diagnosis   Patient  had 1.4 cm poorly differentiated right breast carcinoma diagnosed in Nov 1999 at age 77, 1/7 axillary nodes involved, ER/PR and HER 2 positive. She had mastectomy with the 7 axillary node evaluation, 4 cycles of adriamycin/ cytoxan followed by taxotere, then five years of tamoxifen thru June 2005 (no herceptin in 1999). She had bilateral oophorectomy in May 2005. She was briefly on aromatase inhibitor after tamoxifen, but discontinued this herself due to poor tolerance.   04/04/2015 Genetic Testing   Negative genetic testing on the breast/ovarian cancer pnael.  The Breast/Ovarian gene panel offered by [facility] includes sequencing and rearrangement analysis for the following 20 genes:  ATM, BARD1, BRCA1, BRCA2, BRIP1, CDH1, CHEK2, EPCAM, FANCC, MLH1, MSH2, MSH6, NBN, PALB2, PMS2, PTEN, RAD51C, RAD51D, TP53, and XRCC2.   06/26/2015 Pathology Results   Initial Path Report Bone, curettage, Left femur met, intramedullary subtroch tissue - METASTATIC ADENOCARCINOMA.  Estrogen Receptor: 95%, POSITIVE, STRONG STAINING INTENSITY Progesterone Receptor: 2%, POSITIVE, STRONG  HER2 (+), IHC 3+   06/26/2015 Surgery   Biopsy of metastatic tissue and stabilization with Affixus trochanteric nail, proximal and distal interlock of left femur by Dr. [name]    06/28/2015 Imaging   CT Chest w/ Contrast 1. Extensive right internal mammary chain lymphadenopathy consistent with metastatic breast cancer. 2. Lytic metastases involving the posterior elements at T1 with probable nondisplaced pathologic fracture of the spinous process. 3. No other evidence of thoracic metastatic disease. 4. Trace bilateral pleural effusions with associated bibasilar atelectasis. 5. Indeterminate right thyroid nodule, not previously imaged. This could be evaluated with thyroid ultrasound as clinically warranted.  06/29/2015 -  Chemotherapy   Zometa started monthly on 06/29/15 and switched to q54month from 01/08/17   07/01/2015 Imaging   Bone  Scan 1. Increased activity noted throughout the left femur. Although metastatic disease cannot be excluded, these changes are most likely secondary to prior surgery. 2. No other focal abnormalities identified to suggest metastatic disease.   07/12/2015 - 07/26/2015 Radiation Therapy   Left femur, 30 Gy in 10 fractions by Dr. [name]   07/14/2015 Initial Diagnosis   [name], right [place]   07/19/2015 Imaging   Initial PET Scan 1. Severe multifocal osseous metastatic disease, much greater than anticipated based on prior imaging. 2. Multifocal nodal metastases involving the right internal mammary, prevascular and subpectoral lymph nodes. No axillary pulmonary involvement identified. 3. No distant extra osseous metastases.   07/30/2015 - 11/29/2015 Chemotherapy   Docetaxel, Herceptin and Perjeta every 3 weeks X6 cycles, pt tolerated moderately well, restaging scan showed excellent response    09/18/2015 Imaging   CT Head w/wo Contrast 1. No acute intracranial abnormality or significant interval change. 2. Stable minimal periventricular white matter hypoattenuation on the right. This may reflect a remote ischemic injury. 3. No evidence for metastatic disease to the brain. 4. No focal soft tissue lesion to explain the patient's tenderness.   10/30/2015 Imaging   CT Abdomen Pelvis w/ Contrast 1. Few mildly prominent fluid-filled loops of small bowel scattered throughout the abdomen, with intraluminal fluid density within the distal colon. Given the provided history, findings are suggestive of acute enteritis/diarrheal illness. 2. No other acute intra-abdominal or pelvic process identified. 3. Widespread osseous metastatic disease, better evaluated on most recent PET-CT from 07/19/2015. No pathologic fracture or other complication. 4. 11 mm hypodensity within the left kidney. This lesion measures intermediate density, and is indeterminate. While this lesion is similar in  size relative to recent studies, this is increased in size relative to prior study from 2012. Further evaluation with dedicated renal mass protocol CT and/or MRI is recommended for complete characterization.   12/2015 - 05/14/2016 Chemotherapy   Maintenance Herceptin and pejeta every 3 weeks stopped on 05/14/16 due to disease progression   12/25/2015 Imaging   Restaging PET Scan 1. Markedly improved skeletal activity, with only several faint foci of residual accentuated metabolic activity, but resolution of the vast majority of the previously extensive osseous metastatic disease. 2. Resolution of the prior right internal mammary and right prevascular lymph nodes. 3. Residual subcutaneous edema and edema along fascia planes in the left upper thigh. This remains somewhat more than I would expect for placement of an IM nail 6 weeks ago. If there is leg swelling further down, consider Doppler venous ultrasound to rule out left lower extremity DVT. I do not perceive an obvious difference in density between the pelvic and common femoral veins on the noncontrast CT data. 4. Chronic right maxillary and right sphenoid sinusitis.   01/16/2016 - 10/20/2017 Anti-estrogen oral therapy   Letrozole 2.5 mg daily. She was switched to exemestane due to disease progression on 02/08/17. Stopped on 10/20/2017 when treatment changed to chemo    03/01/2016 Miscellaneous   Patient presented to ED following a fall; she reports she landed on her back and is now experiencing back pain. The patient was evaluated and discharge home same day with pain control.   05/01/2016 Imaging   CT CAP IMPRESSION: 1. Stable exam.  No new or progressive disease identified. 2. No evidence for residual or recurrent adenopathy within  the chest. 3. Stable bone metastasis.   05/01/2016 Imaging   BONE SCAN IMPRESSION: 1. Small focal uptake involving the anterior rib ends of the left second and right fourth ribs, new since the prior bone scan.  The location of this uptake is more suggestive of a traumatic or inflammatory etiology as opposed to metastatic disease. No other evidence of metastatic disease. 2. There are other areas of stable uptake which are most likely degenerative/reactive, including right shoulder and cervical spine uptake and uptake along the right femur adjacent to the ORIF hardware.   05/20/2016 Imaging   NM PET Skull to Thigh IMPRESSION: 1. There multiple metastatic foci in the bony pelvis, including some new abnormal foci compare to the prior PET-CT, compatible with mildly progressive bony metastatic disease. Also, a left T11 vertebral hypermetabolic metastatic lesion is observed, new compared to the prior exam. 2. No active extraosseous malignancy is currently identified. 3. Right anterior fourth rib healing fracture, appears be   06/04/2016 - 01/08/2017 Chemotherapy   Second line chemotherapy Kadcyla every 3 weeks started on 06/04/16 and stopped on 01/08/17 due to mixed response.     10/05/2016 Imaging   CT CAP and Bone Scan:  Bones: left intramedullary rod and left femoral head neck screw in place. In the proximal diaphysis of the left femur there is cortical thickening and irregularity. No lytic or blastic bone lesions. No fracture or vertebral endplate destruction.   No definite evidence of recurrent disease in the chest, abdomen, or pelvis.   10/28/2016 Imaging   CT A/P IMPRESSION: No acute process demonstrated in the abdomen or pelvis. No evidence of bowel obstruction or inflammation.   11/04/2016 Imaging   DG foot complete left: IMPRESSION: No fracture or dislocation.  No soft tissue abnormality   11/23/2016 Imaging   CT RIGHT FEMUR IMPRESSION: Negative CT scan of the right thigh.  No visible metastatic disease.  CT LEFT FEMUR IMPRESSION: 1. Postsurgical changes related to prior cephalomedullary rod fixation of the left femur with linear lucency along the anterolateral proximal femoral  cortex at level of the lesser trochanter, suspicious for nondisplaced fracture. 2. Slightly permeative appearance of the proximal left femoral cortex at the site of known prior osseous metastases is largely unchanged. 3. Unchanged patchy lucency and sclerosis along the anterior acetabulum, which appears to correspond with an area of faint uptake on the prior PET-CT, suspicious for osseous metastasis. These results will be called to the ordering clinician or representative by the Radiologist Assistant, and communication documented in the PACS or zVision Dashboard.       01/27/2017 Imaging   IMPRESSION: 1. Mixed response to osseous metastasis. Some hypermetabolic lesions are new and increasingly hypermetabolic, while another index lesion is decreasingly hypermetabolic. 2. No evidence of hypermetabolic soft tissue metastasis.     02/08/2017 - 10/19/2017 Antibody Plan   -Herceptin every 3 week since 02/08/17 -Oral Lanpantinib $RemoveBeforeDEI'1500mg'uZYxohbdPzzmMdTC$  and decreased to $RemoveBefo'500mg'IfaYgtBvcuA$  due to diarrhea and depression. Due to disease progression she swithced to Verzenio $RemoveBef'150mg'mgrvJsGrBV$  on 05/04/17. She did not toelrate well so she was switched to Ibrance $RemoveBe'100mg'qBJpUAxXT$  on 05/31/17. Due to her neutropenia, Ibrance dose reduced to 75 mg daily, 3 weeks on, one-week off, stopped on 10/20/2017 due to disease progression.    04/30/2017 PET scan   IMPRESSION: 1. Primarily increase in hypermetabolism of osseous metastasis. An isolated right acetabular lesion is decreased in hypermetabolism since the prior exam. 2. No evidence of hypermetabolic soft tissue metastasis. 3. Similar non FDG avid right-sided thyroid nodule, favoring  a benign etiology. Recommend attention on follow-up.   08/30/2017 PET scan   08/30/2017 PET Scan  IMPRESSION: 1. Worsening osseous metastatic disease. 2. Focal hypermetabolism in the central spinal canal the L1 level, similar to the prior exam. Metastatic implant cannot be excluded. 3. Slight enlargement of a right thyroid  nodule which now appears more cystic in character. Consider further evaluation with thyroid ultrasound. If patient is clinically hyperthyroid, consider nuclear medicine thyroid uptake and scan   08/30/2017 Progression   08/30/2017 PET Scan  IMPRESSION: 1. Worsening osseous metastatic disease. 2. Focal hypermetabolism in the central spinal canal the L1 level, similar to the prior exam. Metastatic implant cannot be excluded. 3. Slight enlargement of a right thyroid nodule which now appears more cystic in character. Consider further evaluation with thyroid ultrasound. If patient is clinically hyperthyroid, consider nuclear medicine thyroid uptake and scan   09/29/2017 - 10/11/2017 Radiation Therapy   Pt received 27 Gy in 9 fractions directed at the areas of significant uptake noted on recent bone scan the right pelvis and right proximal femur, with Dr, Sondra Come    10/20/2017 - 04/07/2018 Chemotherapy   -Herceptin every 3 weeks starting 02/08/17, perjeta added on 10/20/2017  -weekly Taxol started on 10/21/2017. Will reduce Taxol to 2 weeks on, 1 week off starting on 02/04/17.  Stoped due to mild disease progression   10/27/2017 Imaging   10/27/2017 CT AP IMPRESSION: 1. No acute abnormality. No evidence of bowel obstruction or acute bowel inflammation. Normal appendix. 2. Stable patchy sclerotic osseous metastases throughout the visualized skeleton. No new or progressive metastatic disease in the abdomen or pelvis.   01/11/2018 Imaging   Whole Body Bone Scan 01/11/18  IMPRESSION: 1. Multifocal osseous metastases as described. 2. Right scapular metastasis. 3. Right superior thoracic spinous process. 4. Right sacral ala and right L5 metastases. 5. Right acetabular metastasis. 6. Right proximal femur metastasis. 7. Diffuse metastases throughout the left femur. 8. Anterior right fourth rib metastasis.   01/11/2018 Imaging   CT CAP W Contrast 01/11/18  IMPRESSION: 1. Similar-appearing  osseous metastatic disease. 2. No evidence for progressed metastatic disease in the chest, abdomen or pelvis.   03/30/2018 Imaging   Bone Scan 03/30/18  IMPRESSION: Stable multifocal bony metastatic disease.   03/30/2018 Imaging    CT CAP W Contrast 03/30/18 IMPRESSION: 1. New mild contiguous wall thickening with associated mucosal hyperenhancement throughout the left colon and rectum, suggesting nonspecific infectious or inflammatory proctocolitis. Differential includes C diff colitis. 2. Stable faint patchy sclerosis in the iliac bones and lumbar vertebral bodies. No new or progressive osseous metastatic disease by CT. Please note that the osseous lesions are relatively occult by CT and would be better evaluated by PET-CT, as clinically warranted. 3. No evidence of extra osseous metastatic disease.   These results will be called to the ordering clinician or representative by the Radiologist Assistant, and communication documented in the PACS or zVision Dashboard.   04/15/2018 - 06/16/2018 Chemotherapy   Enhertu q3weeks starting 04/15/18. Stopped Enhertu after cycle 3 on 05/27/18 due to poor toleration due to poor toleration with anorexia, weight loss, N&V, diarrhea   04/21/2018 PET scan   PET 04/21/18  IMPRESSION: 1. Overall marked interval improvement in bony hypermetabolic disease. All of the previously identified hypermetabolic osseous metastases have decreased in hypermetabolism in the interval. There is a new tiny focus of hypermetabolism in the right aspect of the L3 vertebral body that appears to correspond to a new 8 mm lucency, raising concern for  new metastatic disease. Close attention on follow-up recommended. 2. Focal hypermetabolism in the central spinal canal at the level of L1 previously has resolved. 3. No hypermetabolic soft tissue disease on today's study. 4. Right thyroid nodule seen previously has decreased in the interval.     06/16/2018 Imaging   CT LUMBAR SPINE  WO CONTRAST 06/16/18 IMPRESSION: 1. Transitional lumbosacral anatomy. Correlation with radiographs is recommended prior to any operative intervention. 2. Suspicion of a right L3 vertebral body metastasis on the April PET-CT is not evident by CT. Stable degenerative versus metastatic lesion at the right L5 superior endplate. Small but increased right iliac bone metastasis since March. Lumbar MRI would best characterize lumbar metastatic disease (without contrast if necessary). 3. Suspect L2-L3 left subarticular segment disc herniation. Query left L2 radiculitis. 4. Stable appearance of rightward L4-L5 disc degeneration which could affect the right foramen and right lateral recess.   06/17/2018 - 11/16/2018 Chemotherapy   Restart Taxol 2 weeks on/1 week off with Herceptin and Perjeta q3weeks on 06/17/18. Perjeta was stopped after C5 due to diarrhea. Stopped Taxol after 11/16/18 due to disease progression to L3.    07/06/2018 PET scan   PET  IMPRESSION: 1. Near total resolution of hypermetabolic activity associated with skeletal metastasis. Very mild activity associated with the LEFT iliac bone which is similar to background blood pool activity. 2. No new or progressive disease.   11/21/2018 PET scan   IMPRESSION: 1. New focal hypermetabolic lesion eccentric to the right in the L3 vertebral body, maximum SUV 6.1 compatible with a new focus of active malignancy. 2. Very low-grade activity similar to blood pool in previous existing mildly sclerotic metastatic lesions including the left iliac bone lesion. Some of the mildly sclerotic lesions are not hypermetabolic compatible with effectively treated bony metastatic disease. 3. No hypermetabolic soft tissue lesions are identified.     11/30/2018 - 08/25/2019 Chemotherapy   Continue Herceptin q3weeks. Stopped 08/25/19 due to significant disease progression and switched to  IV Matuzumab   12/12/2018 - 12/17/2018 Chemotherapy   Tucatinib  (TUKYSA) 6 tabs BID as a next line therapy starting 12/12/18. Stopped 12/17/18 duet o N&V, poor appetite and HA    12/21/2018 - 03/17/2019 Chemotherapy   IV Eribulin on day 1, 8 every 21 days starting 12/21/18. Stopped 03/17/19 due to mixed response to treatment.    03/21/2019 PET scan   IMPRESSION: 1. Mixed response to therapy of osseous metastasis. Although a right-sided L4 lesion demonstrates decreased hypermetabolism today, new left iliac and right ischial hypermetabolic lesions are seen. 2. No evidence of hypermetabolic soft tissue metastasis.     03/27/2019 - 07/21/2019 Chemotherapy   Neratinib (Nerlynx) titrate up from 173m to 2427mstarting 03/27/19. She could not tolerate 16058mnd stopped on 02/05/19 due to N&V and diarrhea. Will restart at 120m74m 04/10/18-04/27/19 and 4/19-4/22. Reduced to 80mg51me daily on 05/10/19. Held/stopped since 07/21/19 due to dry cough, Nausea and body aches.   06/23/2019 Imaging   CT Head  IMPRESSION: 1. No acute intracranial abnormality or metastatic disease identified. 2. Chronic white matter disease in the right hemisphere is stable from prior exams.     09/05/2019 PET scan   IMPRESSION: 1. Significant progression of widespread hypermetabolic osseous metastases throughout the axial skeleton as detailed. 2. No hypermetabolic extraosseous sites of metastatic disease.   09/15/2019 - 12/22/2019 Chemotherapy   -IV Margetuximab q3weeks and IV Gemcitabine 2 weeks on/1 week off starting 09/15/19. Carboplatin added to Gemcitabine starting with C3 on 11/03/19.  D/c after C5 due to disease progression in bone.    12/13/2019 Imaging   PET  IMPRESSION: 1. Interval progression of widespread, FDG avid osseous metastasis involving the axial skeleton. 2. No FDG avid extra osseous sites of metastatic disease.     01/19/2020 -  Chemotherapy   -Xeloda BID 2 weeks on/1 week off and Herceptin q3weeks starting 01/19/20. C1 at $Rem'1000mg'aXYn$  BID. C2 increased to $RemoveBefo'1500mg'YQurUqQTGir$  in the AM and $Remo'1000mg'iBodi$   in the PM starting 02/12/20.   04/09/2020 Imaging   CT CAP  IMPRESSION: 1. New 3 mm RIGHT upper lobe nodule, nonspecific, attention on follow-up. 2. What may represent an incomplete periprosthetic fracture about the cephalo medullary rod in the LEFT proximal femur. This linear band of incomplete bony fusion that is more pronounced than on previous CT imaging that was acquired in 2017 and may represent a stress type fracture about hardware. Correlate with any pain in this area. Sclerotic margins show chronic features but the linear band passing through the bone around the intramedullary rod is more extensive, involving approximately 270 degrees of the circumference of the femur, medial cortex remains intact. 3. Other findings of bony metastatic disease are similar to prior imaging. Sclerosis and some periosteal reaction about a nondisplaced pathologic fracture of the LEFT lateral seventh rib. 4. Small hypervascular focus in the RIGHT hepatic lobe, indistinct but new compared to previous imaging. Perhaps this represents a transient phenomenon though is not imaged on the later phase to allow for confirmation. Liver MRI may be helpful for further evaluation, to exclude underlying metastatic lesion as clinically warranted. 5. Hepatic steatosis. 6. Aortic atherosclerosis.   Aortic Atherosclerosis (ICD10-I70.0).     07/18/2020 Imaging   PET  IMPRESSION: No signs of visceral or nodal metastatic disease.   Increased number of hypermetabolic lesions with increased intensity of many of these areas and increased "size" of affected areas based on the regions of FDG uptake. While there are areas that do show diminished FDG uptake such as in the RIGHT scapula on balance the number and size of these areas has increased.   08/02/2020 - 10/11/2020 Chemotherapy   Patient is on Treatment Plan : BREAST Adjuvant CMF IV q21d     10/31/2020 PET scan   IMPRESSION: Interval worsening of disease with new  areas of increased metabolic activity associated with LEFT hilar and infrahilar lymph nodes and on balance worsening of hypermetabolic areas within the spine, pelvis and appendicular skeleton as discussed.   Of particular note is the marked interval worsening of disease in the RIGHT proximal femur, no current visible lesion other than subtle sclerosis is noted in this area, attention to this and other substantial load bearing areas on subsequent imaging.   No additional sites of new disease or visceral involvement.   11/01/2020 -  Chemotherapy   Patient is on Treatment Plan : BREAST METASTATIC Trastuzumab D1 + Vinorelbine D1,8 (25) q21d       ALLERGIES:  is allergic to promethazine hcl, chlorhexidine, and diphenhydramine hcl.  MEDICATIONS:  Current Outpatient Medications  Medication Sig Dispense Refill   dexamethasone (DECADRON) 4 MG tablet Take 1 tablet (4 mg total) by mouth daily. For 3-5 days after chemo (Patient not taking: No sig reported) 30 tablet 0   HYDROmorphone (DILAUDID) 2 MG tablet Take 1 tablet (2 mg total) by mouth every 6 (six) hours as needed for severe pain. 45 tablet 0   lidocaine-prilocaine (EMLA) cream Apply 1 application topically as needed. Apply to Porta-Cath 1-2 hours prior  to access as directed. 90 g 2   LORazepam (ATIVAN) 0.5 MG tablet Take 1 tablet (0.5 mg total) by mouth every 8 (eight) hours as needed for anxiety (nausea). (Patient not taking: Reported on 11/20/2020) 20 tablet 0   megestrol (MEGACE ES) 625 MG/5ML suspension Take 5 mLs (625 mg total) by mouth daily. 150 mL 0   morphine (MS CONTIN) 15 MG 12 hr tablet Take 1 tablet (15 mg total) by mouth every 12 (twelve) hours. 30 tablet 0   potassium chloride (KLOR-CON) 10 MEQ tablet Take 1 tablet (10 mEq total) by mouth 3 (three) times daily. 90 tablet 2   prochlorperazine (COMPAZINE) 10 MG tablet TAKE 1 TABLET(10 MG) BY MOUTH EVERY 6 HOURS AS NEEDED FOR NAUSEA OR VOMITING 60 tablet 1   zolpidem (AMBIEN) 10 MG  tablet Take 1 tablet (10 mg total) by mouth at bedtime as needed for sleep. 30 tablet 0   No current facility-administered medications for this visit.   Facility-Administered Medications Ordered in Other Visits  Medication Dose Route Frequency Provider Last Rate Last Admin   heparin lock flush 100 unit/mL  500 Units Intracatheter Once PRN Truitt Merle, MD       loratadine (CLARITIN) tablet 10 mg  10 mg Oral Daily Truitt Merle, MD   10 mg at 12/27/20 0919   potassium chloride 10 mEq in 100 mL IVPB  10 mEq Intravenous Q1 Hr x 2 Truitt Merle, MD 100 mL/hr at 12/27/20 0938 10 mEq at 12/27/20 0938   potassium chloride 10 MEQ/100ML IVPB            potassium chloride SA (K-DUR) CR tablet 40 mEq  40 mEq Oral Once Truitt Merle, MD       sodium chloride 0.9 % 1,000 mL with potassium chloride 10 mEq infusion   Intravenous Continuous Livesay, Tamala Julian, MD   Stopped at 09/10/15 1653   sodium chloride 0.9 % injection 10 mL  10 mL Intravenous PRN Livesay, Lennis P, MD       sodium chloride flush (NS) 0.9 % injection 10 mL  10 mL Intracatheter PRN Truitt Merle, MD        VITAL SIGNS: There were no vitals taken for this visit. There were no vitals filed for this visit.  Estimated body mass index is 20.58 kg/m as calculated from the following:   Height as of 12/12/20: _0  (1.676 m).   Weight as of an earlier encounter on 12/27/20: 127 lb 8 oz (57.8 kg).  LABS: CBC:    Component Value Date/Time   WBC 3.4 (L) 12/27/2020 0751   WBC 19.0 (H) 10/25/2019 1502   HGB 8.9 (L) 12/27/2020 0751   HGB 11.9 01/08/2017 0909   HCT 26.9 (L) 12/27/2020 0751   HCT 34.8 01/08/2017 0909   PLT 155 12/27/2020 0751   PLT 155 01/08/2017 0909   PLT 217 07/24/2014 1604   MCV 89.1 12/27/2020 0751   MCV 85.2 01/08/2017 0909   NEUTROABS 2.5 12/27/2020 0751   NEUTROABS 2.2 01/08/2017 0909   LYMPHSABS 0.4 (L) 12/27/2020 0751   LYMPHSABS 2.0 01/08/2017 0909   MONOABS 0.3 12/27/2020 0751   MONOABS 0.3 01/08/2017 0909   EOSABS 0.1  12/27/2020 0751   EOSABS 0.1 01/08/2017 0909   EOSABS 0.1 07/24/2014 1604   BASOSABS 0.0 12/27/2020 0751   BASOSABS 0.0 01/08/2017 0909   Comprehensive Metabolic Panel:    Component Value Date/Time   NA 142 12/27/2020 0751   NA 141 01/08/2017 0910  K 2.8 (L) 12/27/2020 0751   K 3.3 (L) 01/08/2017 0910   CL 106 12/27/2020 0751   CL 106 05/31/2012 1055   CO2 24 12/27/2020 0751   CO2 26 01/08/2017 0910   BUN 6 12/27/2020 0751   BUN 10.1 01/08/2017 0910   CREATININE 0.80 12/27/2020 0751   CREATININE 0.9 01/08/2017 0910   GLUCOSE 96 12/27/2020 0751   GLUCOSE 78 01/08/2017 0910   GLUCOSE 88 05/31/2012 1055   CALCIUM 8.8 (L) 12/27/2020 0751   CALCIUM 9.2 01/08/2017 0910   AST 21 12/27/2020 0751   AST 21 01/08/2017 0910   ALT 12 12/27/2020 0751   ALT 17 01/08/2017 0910   ALKPHOS 70 12/27/2020 0751   ALKPHOS 64 01/08/2017 0910   BILITOT 1.4 (H) 12/27/2020 0751   BILITOT 0.95 01/08/2017 0910   PROT 6.4 (L) 12/27/2020 0751   PROT 7.1 01/08/2017 0910   ALBUMIN 3.4 (L) 12/27/2020 0751   ALBUMIN 3.4 (L) 01/08/2017 0910     PERFORMANCE STATUS (ECOG) : 2 - Symptomatic, <50% confined to bed   Physical Exam General: NAD Cardiovascular: regular rate and rhythm Pulmonary: clear ant fields Extremities: no edema, no joint deformities Skin: no rashes Neurological: AAOX3, Interpretor present   IMPRESSION:  Mrs. Westerhold was seen in infusion during cycle 3 trastuzumab and vinorelbine. No acute distress noted. She is resting in bed watching TV with closed caption. Her husband and the interpretor are at bedside. She shares she is tolerating her treatments without concern.   Pain Reports her pain is well controlled. She rates her pain 4 out 10 currently. Endorses 2-3 breakthrough pills needed varying on how much pain that she is in. She also saw Dr. Burr Medico today who refilled her MS Contin and Dilaudid.   Fatigue [name] shares she often naps during the day. Is able to do some light  chores around the home but her husband and daughter mainly takes care of everything. No concerns with sleeping at night. Feels rested when she wakes up.   Constipation Is having daily bowel movements. Is taking colace and Senna as needed.   Poor appetite/anorexia  She reports her appetite remains poor. She is trying to increase protein with shakes. States she loves drinking sodas. Discussed ways to increase water intake also. Complains of occasional nausea which is being managed with compazine    I discussed the importance of continued conversation with family and their medical providers regarding overall plan of care and treatment options, ensuring decisions are within the context of the patients values and GOCs.  PLAN:  Continue Dilaudid 2-4 mg as needed for breakthrough pain and MS Contin. Feels pain is well managed at this time.  Recommendations for small frequent meals in addition to high protein foods/snacks including protein shakes.  Continue with Carnation instant breakfast. Discussed ways to increase water and protein intake. She enjoys drinking sodas.  Colace and senna daily for bowel regimen. Reports currently having daily bowel movements with current regimen.  Ongoing goals of care discussions.  Patient confirms DNR/DNI.  Education provided on advanced directive clinic and MOST form.  Would like to have ongoing discussions at future visits. I will plan to see patient back in 2-4weeks in collaboration with her other oncology appointments.   Patient expressed understanding and was in agreement with this plan. She also understands that She can call the clinic at any time with any questions, concerns, or complaints.   Time Total: 25 min.   Visit consisted of counseling and education  dealing with the complex and emotionally intense issues of symptom management and palliative care in the setting of serious and potentially life-threatening illness.Greater than 50%  of this time was spent  counseling and coordinating care related to the above assessment and plan.  Signed by: Alda Lea, AGPCNP-BC Palliative Medicine Team

## 2020-12-27 NOTE — Progress Notes (Signed)
[place]   [phone] [phone]) [phone]   Clinic Follow up Note   Patient Care Team: [name], [name], [name] (Inactive) as PCP - General (Physician Assistant) [name], MD as Consulting Physician (Orthopedic Surgery) Pickenpack-Cousar, Carlena Sax, NP as Nurse Practitioner (Nurse Practitioner)  Date of Service:  12/27/2020  CHIEF COMPLAINT: f/u of metastatic breast cancer  CURRENT THERAPY:  Trastuzumab D1, and Vinorelbine D1 and 8 every 21 days, starting 11/01/20  ASSESSMENT & PLAN:  [name] is a 55 y.o. female with   1. 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 She had right mastectomy, 4 cycles of AC-T and 5 years of Tamoxifen/AI. -In 06/2015 she had breast cancer recurrence metastatic to the bone.  -she has had multiple endocrine therapy and chemotherapy, overall does not tolerate treatment well and frequently required dose reduction -She understands her cancer is not curable, and the treatment goal is to prolong her life and balance her quality of life -We switched her treatment to CMF and Keytruda on 08/02/20, and continue herceptin. Due to poor tolerance, I dropped the methotrexate and fluorouracil and delayed her third cycle to 09/20/20, so she received cytoxan, Beryle Flock, and herceptin. She tolerated much better with just some fatigue and mild headaches. -restaging PET 10/31/20 unfortunately showed significantly worsening disease. -she was switched to trastuzumab and vinorelbine on 11/01/20. She is tolerating well with nausea and fatigue, dose reduced after cycle 1  -she recently completed palliative RT   -Labs reviewed, potassium is low today. She brought her medications with her, so she will take one orally, and we will also give IV. -repeat echo is scheduled for 12/28   2. Symptom Management: nausea and vomiting with weight loss -with  her increased bone pains, she has had more nausea and vomiting. Because of this, she has lost weight.  -she did not tolerate Boost or Ensure. I reviewed other options, such as milkshakes, smoothies, or Carnation Instant Breakfast. -she reports the compazine is helpful for her nausea. -I will call in megace for her to try over the holidays to stimulate her appetite.   3. Body pain, secondary to bone mets   -She is being treated with Zometa q59month. -She will continue oral calcium and Vit D and Prilosec for Acid Reflux.  -She has received palliative radiation therapy to the left femur (2017) and the right sacroiliac area (2019) under Dr. [name] -her hip and leg pain has increased recently. Restaging PET 10/31/20 showed worsening of the disease in the bones. She received another course of palliative radiation to the right femur and left hip 11/28-12/14/22.   4. Goal of care discussion, DNR/DNI -We previously reviewed goal of care and I recommend DNR/DNI, she agreed again 07/19/20. -I again encouraged her to start setting up her living will. Her husband [name] automatically her HCPOA. She is considering changing her HCPOA to her daughter [name] whom she lives with.  -Though her care is palliative, our goal is to prolong her life. She would like to continue treatment.   5. Congenital deafness   6. Mild anemia, secondary to chemotherapy and malignancy  -her hgb decreased to 7.8 on 09/13/20.  -improved off methotrexate and fluorouraci.  -her hgb has gone back down from the trastuzumab/vinorelbine. Hgb stable at 8.9 today after her break for radiation.   7. 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 Mild, only in her feet. -worsened with  CMF, remains mild off treatment.     PLAN:   -proceed with C3 trastuzumab and vinorelbine today, chemo break for next two weeks due to holiday   -IV potassium 69mq today as well -lab, flush, f/u, and C4 on 12/30 as scheduled. -I refilled MS contin and dilaudid,  called in Megace for her anorexia   No problem-specific Assessment & Plan notes found for this encounter.   SUMMARY OF ONCOLOGIC HISTORY: Oncology History Overview Note   Cancer Staging No matching staging information was found for the patient.     [name], [place]  11/1997 Cancer Diagnosis   Patient had 1.4 cm poorly differentiated right breast carcinoma diagnosed in Nov 1999 at age 6, 1/7 axillary nodes involved, ER/PR and HER 2 positive. She had mastectomy with the 7 axillary node evaluation, 4 cycles of adriamycin/ cytoxan followed by taxotere, then five years of tamoxifen thru June 2005 (no herceptin in 1999). She had bilateral oophorectomy in May 2005. She was briefly on aromatase inhibitor after tamoxifen, but discontinued this herself due to poor tolerance.   04/04/2015 Genetic Testing   Negative genetic testing on the breast/ovarian cancer pnael.  The Breast/Ovarian gene panel offered by [facility] includes sequencing and rearrangement analysis for the following 20 genes:  ATM, BARD1, BRCA1, BRCA2, BRIP1, CDH1, CHEK2, EPCAM, FANCC, MLH1, MSH2, MSH6, NBN, PALB2, PMS2, PTEN, RAD51C, RAD51D, TP53, and XRCC2.   06/26/2015 Pathology Results   Initial Path Report Bone, curettage, Left femur met, intramedullary subtroch tissue - METASTATIC ADENOCARCINOMA.  Estrogen Receptor: 95%, POSITIVE, STRONG STAINING INTENSITY Progesterone Receptor: 2%, POSITIVE, STRONG  HER2 (+), IHC 3+   06/26/2015 Surgery   Biopsy of metastatic tissue and stabilization with Affixus trochanteric nail, proximal and distal interlock of left femur by Dr. YLorin Mercy   06/28/2015 Imaging   CT Chest w/ Contrast 1. Extensive right internal mammary chain lymphadenopathy consistent with metastatic breast cancer. 2. Lytic metastases involving the posterior elements at T1 with probable nondisplaced pathologic fracture of the spinous process. 3. No other evidence of thoracic metastatic  disease. 4. Trace bilateral pleural effusions with associated bibasilar atelectasis. 5. Indeterminate right thyroid nodule, not previously imaged. This could be evaluated with thyroid ultrasound as clinically warranted.   06/29/2015 -  Chemotherapy   Zometa started monthly on 06/29/15 and switched to q351monthfrom 01/08/17   07/01/2015 Imaging   Bone Scan 1. Increased activity noted throughout the left femur. Although metastatic disease cannot be excluded, these changes are most likely secondary to prior surgery. 2. No other focal abnormalities identified to suggest metastatic disease.   07/12/2015 - 07/26/2015 Radiation Therapy   Left femur, 30 Gy in 10 fractions by Dr. KiSondra Come  07/14/2015 Initial Diagnosis   [name], right [place]  07/19/2015 Imaging   Initial PET Scan 1. Severe multifocal osseous metastatic disease, much greater than anticipated based on prior imaging. 2. Multifocal nodal metastases involving the right internal mammary, prevascular and subpectoral lymph nodes. No axillary pulmonary involvement identified. 3. No distant extra osseous metastases.   07/30/2015 - 11/29/2015 Chemotherapy   Docetaxel, Herceptin and Perjeta every 3 weeks X6 cycles, pt tolerated moderately well, restaging scan showed excellent response    09/18/2015 Imaging   CT Head w/wo Contrast 1. No acute intracranial abnormality or significant interval change. 2. Stable minimal periventricular white matter hypoattenuation on the right. This may reflect a remote ischemic injury. 3. No evidence for metastatic disease to the brain.  4. No focal soft tissue lesion to explain the patient's tenderness.   10/30/2015 Imaging   CT Abdomen Pelvis w/ Contrast 1. Few mildly prominent fluid-filled loops of small bowel scattered throughout the abdomen, with intraluminal fluid density within the distal colon. Given the provided history, findings are suggestive of acute enteritis/diarrheal  illness. 2. No other acute intra-abdominal or pelvic process identified. 3. Widespread osseous metastatic disease, better evaluated on most recent PET-CT from 07/19/2015. No pathologic fracture or other complication. 4. 11 mm hypodensity within the left kidney. This lesion measures intermediate density, and is indeterminate. While this lesion is similar in size relative to recent studies, this is increased in size relative to prior study from 2012. Further evaluation with dedicated renal mass protocol CT and/or MRI is recommended for complete characterization.   12/2015 - 05/14/2016 Chemotherapy   Maintenance Herceptin and pejeta every 3 weeks stopped on 05/14/16 due to disease progression   12/25/2015 Imaging   Restaging PET Scan 1. Markedly improved skeletal activity, with only several faint foci of residual accentuated metabolic activity, but resolution of the vast majority of the previously extensive osseous metastatic disease. 2. Resolution of the prior right internal mammary and right prevascular lymph nodes. 3. Residual subcutaneous edema and edema along fascia planes in the left upper thigh. This remains somewhat more than I would expect for placement of an IM nail 6 weeks ago. If there is leg swelling further down, consider Doppler venous ultrasound to rule out left lower extremity DVT. I do not perceive an obvious difference in density between the pelvic and common femoral veins on the noncontrast CT data. 4. Chronic right maxillary and right sphenoid sinusitis.   01/16/2016 - 10/20/2017 Anti-estrogen oral therapy   Letrozole 2.5 mg daily. She was switched to exemestane due to disease progression on 02/08/17. Stopped on 10/20/2017 when treatment changed to chemo    03/01/2016 Miscellaneous   Patient presented to ED following a fall; she reports she landed on her back and is now experiencing back pain. The patient was evaluated and discharge home same day with pain control.   05/01/2016 Imaging    CT CAP IMPRESSION: 1. Stable exam.  No new or progressive disease identified. 2. No evidence for residual or recurrent adenopathy within the chest. 3. Stable bone metastasis.   05/01/2016 Imaging   BONE SCAN IMPRESSION: 1. Small focal uptake involving the anterior rib ends of the left second and right fourth ribs, new since the prior bone scan. The location of this uptake is more suggestive of a traumatic or inflammatory etiology as opposed to metastatic disease. No other evidence of metastatic disease. 2. There are other areas of stable uptake which are most likely degenerative/reactive, including right shoulder and cervical spine uptake and uptake along the right femur adjacent to the ORIF hardware.   05/20/2016 Imaging   NM PET Skull to Thigh IMPRESSION: 1. There multiple metastatic foci in the bony pelvis, including some new abnormal foci compare to the prior PET-CT, compatible with mildly progressive bony metastatic disease. Also, a left T11 vertebral hypermetabolic metastatic lesion is observed, new compared to the prior exam. 2. No active extraosseous malignancy is currently identified. 3. Right anterior fourth rib healing fracture, appears be   06/04/2016 - 01/08/2017 Chemotherapy   Second line chemotherapy Kadcyla every 3 weeks started on 06/04/16 and stopped on 01/08/17 due to mixed response.     10/05/2016 Imaging   CT CAP and Bone Scan:  Bones: left intramedullary rod and left femoral head  neck screw in place. In the proximal diaphysis of the left femur there is cortical thickening and irregularity. No lytic or blastic bone lesions. No fracture or vertebral endplate destruction.   No definite evidence of recurrent disease in the chest, abdomen, or pelvis.   10/28/2016 Imaging   CT A/P IMPRESSION: No acute process demonstrated in the abdomen or pelvis. No evidence of bowel obstruction or inflammation.   11/04/2016 Imaging   DG foot complete  left: IMPRESSION: No fracture or dislocation.  No soft tissue abnormality   11/23/2016 Imaging   CT RIGHT FEMUR IMPRESSION: Negative CT scan of the right thigh.  No visible metastatic disease.  CT LEFT FEMUR IMPRESSION: 1. Postsurgical changes related to prior cephalomedullary rod fixation of the left femur with linear lucency along the anterolateral proximal femoral cortex at level of the lesser trochanter, suspicious for nondisplaced fracture. 2. Slightly permeative appearance of the proximal left femoral cortex at the site of known prior osseous metastases is largely unchanged. 3. Unchanged patchy lucency and sclerosis along the anterior acetabulum, which appears to correspond with an area of faint uptake on the prior PET-CT, suspicious for osseous metastasis. These results will be called to the ordering clinician or representative by the Radiologist Assistant, and communication documented in the PACS or zVision Dashboard.       01/27/2017 Imaging   IMPRESSION: 1. Mixed response to osseous metastasis. Some hypermetabolic lesions are new and increasingly hypermetabolic, while another index lesion is decreasingly hypermetabolic. 2. No evidence of hypermetabolic soft tissue metastasis.     02/08/2017 - 10/19/2017 Antibody Plan   -Herceptin every 3 week since 02/08/17 -Oral Lanpantinib 1552m and decreased to 507mdue to diarrhea and depression. Due to disease progression she swithced to Verzenio 15067mn 05/04/17. She did not toelrate well so she was switched to Ibrance 100m47m 05/31/17. Due to her neutropenia, Ibrance dose reduced to 75 mg daily, 3 weeks on, one-week off, stopped on 10/20/2017 due to disease progression.    04/30/2017 PET scan   IMPRESSION: 1. Primarily increase in hypermetabolism of osseous metastasis. An isolated right acetabular lesion is decreased in hypermetabolism since the prior exam. 2. No evidence of hypermetabolic soft tissue metastasis. 3. Similar non  FDG avid right-sided thyroid nodule, favoring a benign etiology. Recommend attention on follow-up.   08/30/2017 PET scan   08/30/2017 PET Scan  IMPRESSION: 1. Worsening osseous metastatic disease. 2. Focal hypermetabolism in the central spinal canal the L1 level, similar to the prior exam. Metastatic implant cannot be excluded. 3. Slight enlargement of a right thyroid nodule which now appears more cystic in character. Consider further evaluation with thyroid ultrasound. If patient is clinically hyperthyroid, consider nuclear medicine thyroid uptake and scan   08/30/2017 Progression   08/30/2017 PET Scan  IMPRESSION: 1. Worsening osseous metastatic disease. 2. Focal hypermetabolism in the central spinal canal the L1 level, similar to the prior exam. Metastatic implant cannot be excluded. 3. Slight enlargement of a right thyroid nodule which now appears more cystic in character. Consider further evaluation with thyroid ultrasound. If patient is clinically hyperthyroid, consider nuclear medicine thyroid uptake and scan   09/29/2017 - 10/11/2017 Radiation Therapy   Pt received 27 Gy in 9 fractions directed at the areas of significant uptake noted on recent bone scan the right pelvis and right proximal femur, with Dr, KinaSondra Come10/09/2017 - 04/07/2018 Chemotherapy   -Herceptin every 3 weeks starting 02/08/17, perjeta added on 10/20/2017  -weekly Taxol started on 10/21/2017. Will reduce  Taxol to 2 weeks on, 1 week off starting on 02/04/17.  Stoped due to mild disease progression   10/27/2017 Imaging   10/27/2017 CT AP IMPRESSION: 1. No acute abnormality. No evidence of bowel obstruction or acute bowel inflammation. Normal appendix. 2. Stable patchy sclerotic osseous metastases throughout the visualized skeleton. No new or progressive metastatic disease in the abdomen or pelvis.   01/11/2018 Imaging   Whole Body Bone Scan 01/11/18  IMPRESSION: 1. Multifocal osseous metastases as  described. 2. Right scapular metastasis. 3. Right superior thoracic spinous process. 4. Right sacral ala and right L5 metastases. 5. Right acetabular metastasis. 6. Right proximal femur metastasis. 7. Diffuse metastases throughout the left femur. 8. Anterior right fourth rib metastasis.   01/11/2018 Imaging   CT CAP W Contrast 01/11/18  IMPRESSION: 1. Similar-appearing osseous metastatic disease. 2. No evidence for progressed metastatic disease in the chest, abdomen or pelvis.   03/30/2018 Imaging   Bone Scan 03/30/18  IMPRESSION: Stable multifocal bony metastatic disease.   03/30/2018 Imaging    CT CAP W Contrast 03/30/18 IMPRESSION: 1. New mild contiguous wall thickening with associated mucosal hyperenhancement throughout the left colon and rectum, suggesting nonspecific infectious or inflammatory proctocolitis. Differential includes C diff colitis. 2. Stable faint patchy sclerosis in the iliac bones and lumbar vertebral bodies. No new or progressive osseous metastatic disease by CT. Please note that the osseous lesions are relatively occult by CT and would be better evaluated by PET-CT, as clinically warranted. 3. No evidence of extra osseous metastatic disease.   These results will be called to the ordering clinician or representative by the Radiologist Assistant, and communication documented in the PACS or zVision Dashboard.   04/15/2018 - 06/16/2018 Chemotherapy   Enhertu q3weeks starting 04/15/18. Stopped Enhertu after cycle 3 on 05/27/18 due to poor toleration due to poor toleration with anorexia, weight loss, N&V, diarrhea   04/21/2018 PET scan   PET 04/21/18  IMPRESSION: 1. Overall marked interval improvement in bony hypermetabolic disease. All of the previously identified hypermetabolic osseous metastases have decreased in hypermetabolism in the interval. There is a new tiny focus of hypermetabolism in the right aspect of the L3 vertebral body that appears to correspond  to a new 8 mm lucency, raising concern for new metastatic disease. Close attention on follow-up recommended. 2. Focal hypermetabolism in the central spinal canal at the level of L1 previously has resolved. 3. No hypermetabolic soft tissue disease on today's study. 4. Right thyroid nodule seen previously has decreased in the interval.     06/16/2018 Imaging   CT LUMBAR SPINE WO CONTRAST 06/16/18 IMPRESSION: 1. Transitional lumbosacral anatomy. Correlation with radiographs is recommended prior to any operative intervention. 2. Suspicion of a right L3 vertebral body metastasis on the April PET-CT is not evident by CT. Stable degenerative versus metastatic lesion at the right L5 superior endplate. Small but increased right iliac bone metastasis since March. Lumbar MRI would best characterize lumbar metastatic disease (without contrast if necessary). 3. Suspect L2-L3 left subarticular segment disc herniation. Query left L2 radiculitis. 4. Stable appearance of rightward L4-L5 disc degeneration which could affect the right foramen and right lateral recess.   06/17/2018 - 11/16/2018 Chemotherapy   Restart Taxol 2 weeks on/1 week off with Herceptin and Perjeta q3weeks on 06/17/18. Perjeta was stopped after C5 due to diarrhea. Stopped Taxol after 11/16/18 due to disease progression to L3.    07/06/2018 PET scan   PET  IMPRESSION: 1. Near total resolution of hypermetabolic activity associated with  skeletal metastasis. Very mild activity associated with the LEFT iliac bone which is similar to background blood pool activity. 2. No new or progressive disease.   11/21/2018 PET scan   IMPRESSION: 1. New focal hypermetabolic lesion eccentric to the right in the L3 vertebral body, maximum SUV 6.1 compatible with a new focus of active malignancy. 2. Very low-grade activity similar to blood pool in previous existing mildly sclerotic metastatic lesions including the left iliac bone lesion. Some of the  mildly sclerotic lesions are not hypermetabolic compatible with effectively treated bony metastatic disease. 3. No hypermetabolic soft tissue lesions are identified.     11/30/2018 - 08/25/2019 Chemotherapy   Continue Herceptin q3weeks. Stopped 08/25/19 due to significant disease progression and switched to  IV Matuzumab   12/12/2018 - 12/17/2018 Chemotherapy   Tucatinib (TUKYSA) 6 tabs BID as a next line therapy starting 12/12/18. Stopped 12/17/18 duet o N&V, poor appetite and HA    12/21/2018 - 03/17/2019 Chemotherapy   IV Eribulin on day 1, 8 every 21 days starting 12/21/18. Stopped 03/17/19 due to mixed response to treatment.    03/21/2019 PET scan   IMPRESSION: 1. Mixed response to therapy of osseous metastasis. Although a right-sided L4 lesion demonstrates decreased hypermetabolism today, new left iliac and right ischial hypermetabolic lesions are seen. 2. No evidence of hypermetabolic soft tissue metastasis.     03/27/2019 - 07/21/2019 Chemotherapy   Neratinib (Nerlynx) titrate up from 175m to 2437mstarting 03/27/19. She could not tolerate 16033mnd stopped on 02/05/19 due to N&V and diarrhea. Will restart at 120m44m 04/10/18-04/27/19 and 4/19-4/22. Reduced to 80mg32me daily on 05/10/19. Held/stopped since 07/21/19 due to dry cough, Nausea and body aches.   06/23/2019 Imaging   CT Head  IMPRESSION: 1. No acute intracranial abnormality or metastatic disease identified. 2. Chronic white matter disease in the right hemisphere is stable from prior exams.     09/05/2019 PET scan   IMPRESSION: 1. Significant progression of widespread hypermetabolic osseous metastases throughout the axial skeleton as detailed. 2. No hypermetabolic extraosseous sites of metastatic disease.   09/15/2019 - 12/22/2019 Chemotherapy   -IV Margetuximab q3weeks and IV Gemcitabine 2 weeks on/1 week off starting 09/15/19. Carboplatin added to Gemcitabine starting with C3 on 11/03/19. D/c after C5 due to disease progression  in bone.    12/13/2019 Imaging   PET  IMPRESSION: 1. Interval progression of widespread, FDG avid osseous metastasis involving the axial skeleton. 2. No FDG avid extra osseous sites of metastatic disease.     01/19/2020 -  Chemotherapy   -Xeloda BID 2 weeks on/1 week off and Herceptin q3weeks starting 01/19/20. C1 at 1000mg 58m C2 increased to 1500mg i4me AM and 1000mg in40m PM starting 02/12/20.   04/09/2020 Imaging   CT CAP  IMPRESSION: 1. New 3 mm RIGHT upper lobe nodule, nonspecific, attention on follow-up. 2. What may represent an incomplete periprosthetic fracture about the cephalo medullary rod in the LEFT proximal femur. This linear band of incomplete bony fusion that is more pronounced than on previous CT imaging that was acquired in 2017 and may represent a stress type fracture about hardware. Correlate with any pain in this area. Sclerotic margins show chronic features but the linear band passing through the bone around the intramedullary rod is more extensive, involving approximately 270 degrees of the circumference of the femur, medial cortex remains intact. 3. Other findings of bony metastatic disease are similar to prior imaging. Sclerosis and some periosteal reaction about a nondisplaced  pathologic fracture of the LEFT lateral seventh rib. 4. Small hypervascular focus in the RIGHT hepatic lobe, indistinct but new compared to previous imaging. Perhaps this represents a transient phenomenon though is not imaged on the later phase to allow for confirmation. Liver MRI may be helpful for further evaluation, to exclude underlying metastatic lesion as clinically warranted. 5. Hepatic steatosis. 6. Aortic atherosclerosis.   Aortic Atherosclerosis (ICD10-I70.0).     07/18/2020 Imaging   PET  IMPRESSION: No signs of visceral or nodal metastatic disease.   Increased number of hypermetabolic lesions with increased intensity of many of these areas and increased "size" of  affected areas based on the regions of FDG uptake. While there are areas that do show diminished FDG uptake such as in the RIGHT scapula on balance the number and size of these areas has increased.   08/02/2020 - 10/11/2020 Chemotherapy   Patient is on Treatment Plan : BREAST Adjuvant CMF IV q21d     10/31/2020 PET scan   IMPRESSION: Interval worsening of disease with new areas of increased metabolic activity associated with LEFT hilar and infrahilar lymph nodes and on balance worsening of hypermetabolic areas within the spine, pelvis and appendicular skeleton as discussed.   Of particular note is the marked interval worsening of disease in the RIGHT proximal femur, no current visible lesion other than subtle sclerosis is noted in this area, attention to this and other substantial load bearing areas on subsequent imaging.   No additional sites of new disease or visceral involvement.   11/01/2020 -  Chemotherapy   Patient is on Treatment Plan : BREAST METASTATIC Trastuzumab D1 + Vinorelbine D1,8 (25) q21d        INTERVAL HISTORY:  [name] is here for a follow up of metastatic breast cancer. She was last seen by me on 11/22/20 and by PA Anda Kraft in the interim. She presents to the clinic accompanied by her husband [name] [place]. She reports the pain in her hip is not quite feeling better yet, but she notes she is taking 1-2 pain medications a day (2 only if the pain is really bad). She notes she tolerated last cycle well overall. She notes one instance of pink blood after wiping one time. She denies burning. [name] [place] notes he is concerned that she is not eating as well after infusion. She also reports she is sleeping a lot after chemo.   All other systems were reviewed with the patient and are negative.  MEDICAL HISTORY:  Past Medical History:  Diagnosis Date   Abnormal Pap smear    Anxiety    Breast cancer ([place])    Cancer ([place]) 1999   breast-s/p mastectomy, chemo, rad   CIN I  (cervical intraepithelial neoplasia I) 2003   by colpo   Congenital deafness    Deaf    Depression    Port-A-Cath in place 2017   for chemotherapy   Radiation 07/12/15-07/26/15   left femur 30 Gy   S/P bilateral oophorectomy     SURGICAL HISTORY: Past Surgical History:  Procedure Laterality Date   ABDOMINAL HYSTERECTOMY     INTRAMEDULLARY (IM) NAIL INTERTROCHANTERIC Left 06/26/2015   Procedure: LEFT BIOMET LONG AFFIXS NAIL;  Surgeon: [name], MD;  Location: [facility];  Service: Orthopedics;  Laterality: Left;   IR GENERIC HISTORICAL  08/06/2015   IR US GUIDE VASC ACCESS LEFT 08/06/2015 Aletta Edouard, MD [facility]   IR GENERIC HISTORICAL  08/06/2015   IR FLUORO GUIDE CV LINE LEFT 08/06/2015  Aletta Edouard, MD [facility]   IR GENERIC HISTORICAL  03/05/2016   IR CV LINE INJECTION 03/05/2016 Markus Daft, MD [facility]   LEFT HEART CATH AND CORONARY ANGIOGRAPHY N/A 12/13/2017   Procedure: LEFT HEART CATH AND CORONARY ANGIOGRAPHY;  Surgeon: Leonie Man, MD;  Location: [facility];  Service: Cardiovascular;  Laterality: N/A;   MASTECTOMY     right breast   MASTECTOMY     OVARIAN CYST REMOVAL     RADIOLOGY WITH ANESTHESIA Left 06/25/2015   Procedure: MRI OF LEFT HIP WITH OR WITHOUT CONTRAST;  Surgeon: Medication Radiologist, MD;  Location: [facility];  Service: Radiology;  Laterality: Left;  DR. MCINTYRE/MRI   TUBAL LIGATION      I have reviewed the social history and family history with the patient and they are unchanged from previous note.  ALLERGIES:  is allergic to promethazine hcl, chlorhexidine, and diphenhydramine hcl.  MEDICATIONS:  Current Outpatient Medications  Medication Sig Dispense Refill   dexamethasone (DECADRON) 4 MG tablet Take 1 tablet (4 mg total) by mouth daily. For 3-5 days after chemo (Patient not taking: No sig reported) 30 tablet 0   HYDROmorphone (DILAUDID) 2 MG tablet Take 1 tablet (2 mg total) by mouth every 6 (six) hours as needed for severe pain.  45 tablet 0   lidocaine-prilocaine (EMLA) cream Apply 1 application topically as needed. Apply to Porta-Cath 1-2 hours prior to access as directed. 90 g 2   LORazepam (ATIVAN) 0.5 MG tablet Take 1 tablet (0.5 mg total) by mouth every 8 (eight) hours as needed for anxiety (nausea). (Patient not taking: Reported on 11/20/2020) 20 tablet 0   megestrol (MEGACE ES) 625 MG/5ML suspension Take 5 mLs (625 mg total) by mouth daily. 150 mL 0   morphine (MS CONTIN) 15 MG 12 hr tablet Take 1 tablet (15 mg total) by mouth every 12 (twelve) hours. 30 tablet 0   potassium chloride (KLOR-CON) 10 MEQ tablet Take 1 tablet (10 mEq total) by mouth 3 (three) times daily. 90 tablet 2   prochlorperazine (COMPAZINE) 10 MG tablet TAKE 1 TABLET(10 MG) BY MOUTH EVERY 6 HOURS AS NEEDED FOR NAUSEA OR VOMITING 60 tablet 1   zolpidem (AMBIEN) 10 MG tablet Take 1 tablet (10 mg total) by mouth at bedtime as needed for sleep. 30 tablet 0   No current facility-administered medications for this visit.   Facility-Administered Medications Ordered in Other Visits  Medication Dose Route Frequency Provider Last Rate Last Admin   heparin lock flush 100 unit/mL  500 Units Intracatheter Once PRN Truitt Merle, MD       loratadine (CLARITIN) tablet 10 mg  10 mg Oral Daily Truitt Merle, MD   10 mg at 12/27/20 0919   potassium chloride 10 MEQ/100ML IVPB            potassium chloride SA (K-DUR) CR tablet 40 mEq  40 mEq Oral Once Truitt Merle, MD       sodium chloride 0.9 % 1,000 mL with potassium chloride 10 mEq infusion   Intravenous Continuous Livesay, Tamala Julian, MD   Stopped at 09/10/15 1653   sodium chloride 0.9 % injection 10 mL  10 mL Intravenous PRN Livesay, Lennis P, MD       sodium chloride flush (NS) 0.9 % injection 10 mL  10 mL Intracatheter PRN Truitt Merle, MD        PHYSICAL EXAMINATION: ECOG PERFORMANCE STATUS: 2 - Symptomatic, <50% confined to bed  There were no  vitals filed for this visit. Wt Readings from Last 3 Encounters:  12/27/20  127 lb 8 oz (57.8 kg)  12/12/20 126 lb 14.4 oz (57.6 kg)  11/22/20 131 lb 1.6 oz (59.5 kg)     GENERAL:alert, no distress and comfortable SKIN: skin color normal, no rashes or significant lesions EYES: normal, Conjunctiva are pink and non-injected, sclera clear  NEURO: alert & oriented x 3 with fluent speech  LABORATORY DATA:  I have reviewed the data as listed CBC Latest Ref Rng & Units 12/27/2020 12/12/2020 11/29/2020  WBC 4.0 - 10.5 K/uL 3.4(L) 2.5(L) 4.0  Hemoglobin 12.0 - 15.0 g/dL 8.9(L) 8.3(L) 8.0(L)  Hematocrit 36.0 - 46.0 % 26.9(L) 24.7(L) 24.9(L)  Platelets 150 - 400 K/uL 155 186 204     CMP Latest Ref Rng & Units 12/27/2020 12/12/2020 11/29/2020  Glucose 70 - 99 mg/dL 96 109(H) 136(H)  BUN 6 - 20 mg/dL 6 7 8   Creatinine 0.44 - 1.00 mg/dL 0.80 0.81 0.96  Sodium 135 - 145 mmol/L 142 138 138  Potassium 3.5 - 5.1 mmol/L 2.8(L) 3.6 3.1(L)  Chloride 98 - 111 mmol/L 106 103 105  CO2 22 - 32 mmol/L 24 25 21(L)  Calcium 8.9 - 10.3 mg/dL 8.8(L) 9.4 9.2  Total Protein 6.5 - 8.1 g/dL 6.4(L) 6.6 6.5  Total Bilirubin 0.3 - 1.2 mg/dL 1.4(H) 1.8(H) 1.2  Alkaline Phos 38 - 126 U/L 70 87 82  AST 15 - 41 U/L 21 22 20   ALT 0 - 44 U/L 12 14 16       RADIOGRAPHIC STUDIES: I have personally reviewed the radiological images as listed and agreed with the findings in the report. No results found.    No orders of the defined types were placed in this encounter.  All questions were answered. The patient knows to call the clinic with any problems, questions or concerns. No barriers to learning was detected. The total time spent in the appointment was 30 minutes.     Truitt Merle, MD 12/27/2020   I, Wilburn Mylar, am acting as scribe for Truitt Merle, MD.   I have reviewed the above documentation for accuracy and completeness, and I agree with the above.

## 2020-12-31 ENCOUNTER — Other Ambulatory Visit: Payer: Self-pay

## 2020-12-31 MED ORDER — MEGESTROL ACETATE 400 MG/10ML PO SUSP: 400.0000 mg | Freq: Every day | ORAL | 0 refills | Status: DC

## 2021-01-08 ENCOUNTER — Inpatient Hospital Stay (HOSPITAL_COMMUNITY): Admission: RE | Admit: 2021-01-08 | Payer: Medicaid Other | Source: Ambulatory Visit

## 2021-01-10 ENCOUNTER — Inpatient Hospital Stay: Payer: Medicaid Other

## 2021-01-10 ENCOUNTER — Inpatient Hospital Stay: Payer: Medicaid Other | Admitting: Hematology

## 2021-01-10 ENCOUNTER — Inpatient Hospital Stay: Payer: Medicaid Other | Admitting: Nurse Practitioner

## 2021-01-10 ENCOUNTER — Telehealth: Payer: Self-pay | Admitting: Medical Oncology

## 2021-01-10 NOTE — Telephone Encounter (Signed)
Kelli told me pt called her interpretor ,[name], to cancel her appt today because she is not feeling well.

## 2021-01-17 ENCOUNTER — Inpatient Hospital Stay: Payer: Medicaid Other

## 2021-01-17 ENCOUNTER — Encounter: Payer: Self-pay | Admitting: Hematology

## 2021-01-17 ENCOUNTER — Inpatient Hospital Stay (HOSPITAL_BASED_OUTPATIENT_CLINIC_OR_DEPARTMENT_OTHER): Payer: Medicaid Other | Admitting: Hematology

## 2021-01-17 ENCOUNTER — Other Ambulatory Visit: Payer: Medicaid Other

## 2021-01-17 ENCOUNTER — Inpatient Hospital Stay (HOSPITAL_BASED_OUTPATIENT_CLINIC_OR_DEPARTMENT_OTHER): Payer: Medicaid Other | Admitting: Nurse Practitioner

## 2021-01-17 ENCOUNTER — Inpatient Hospital Stay: Payer: Medicaid Other | Attending: Hematology

## 2021-01-17 ENCOUNTER — Ambulatory Visit: Payer: Medicaid Other | Admitting: Hematology

## 2021-01-17 ENCOUNTER — Other Ambulatory Visit: Payer: Self-pay

## 2021-01-17 ENCOUNTER — Ambulatory Visit: Payer: Medicaid Other

## 2021-01-17 ENCOUNTER — Encounter: Payer: Medicaid Other | Admitting: Nurse Practitioner

## 2021-01-17 ENCOUNTER — Encounter: Payer: Self-pay | Admitting: Radiation Oncology

## 2021-01-17 VITALS — BP 110/61 | HR 68 | Temp 98.0°F | Resp 18

## 2021-01-17 DIAGNOSIS — Z5189 Encounter for other specified aftercare: Secondary | ICD-10-CM | POA: Diagnosis not present

## 2021-01-17 DIAGNOSIS — Z515 Encounter for palliative care: Secondary | ICD-10-CM

## 2021-01-17 DIAGNOSIS — C50911 Malignant neoplasm of unspecified site of right female breast: Secondary | ICD-10-CM

## 2021-01-17 DIAGNOSIS — Z170 Estrogen receptor positive status [ER+]: Secondary | ICD-10-CM | POA: Insufficient documentation

## 2021-01-17 DIAGNOSIS — R530 Neoplastic (malignant) related fatigue: Secondary | ICD-10-CM | POA: Diagnosis not present

## 2021-01-17 DIAGNOSIS — R531 Weakness: Secondary | ICD-10-CM | POA: Diagnosis not present

## 2021-01-17 DIAGNOSIS — R634 Abnormal weight loss: Secondary | ICD-10-CM | POA: Diagnosis not present

## 2021-01-17 DIAGNOSIS — K5900 Constipation, unspecified: Secondary | ICD-10-CM | POA: Insufficient documentation

## 2021-01-17 DIAGNOSIS — C7951 Secondary malignant neoplasm of bone: Secondary | ICD-10-CM | POA: Insufficient documentation

## 2021-01-17 DIAGNOSIS — H905 Unspecified sensorineural hearing loss: Secondary | ICD-10-CM | POA: Insufficient documentation

## 2021-01-17 DIAGNOSIS — Z5111 Encounter for antineoplastic chemotherapy: Secondary | ICD-10-CM | POA: Insufficient documentation

## 2021-01-17 DIAGNOSIS — Z5112 Encounter for antineoplastic immunotherapy: Secondary | ICD-10-CM | POA: Insufficient documentation

## 2021-01-17 DIAGNOSIS — R5383 Other fatigue: Secondary | ICD-10-CM | POA: Diagnosis not present

## 2021-01-17 DIAGNOSIS — G893 Neoplasm related pain (acute) (chronic): Secondary | ICD-10-CM | POA: Diagnosis not present

## 2021-01-17 DIAGNOSIS — G620 Drug-induced polyneuropathy: Secondary | ICD-10-CM | POA: Insufficient documentation

## 2021-01-17 DIAGNOSIS — M8580 Other specified disorders of bone density and structure, unspecified site: Secondary | ICD-10-CM | POA: Insufficient documentation

## 2021-01-17 DIAGNOSIS — Z95828 Presence of other vascular implants and grafts: Secondary | ICD-10-CM

## 2021-01-17 LAB — CMP (CANCER CENTER ONLY)
ALT: 10 U/L (ref 0–44)
AST: 20 U/L (ref 15–41)
Albumin: 4 g/dL (ref 3.5–5.0)
Alkaline Phosphatase: 76 U/L (ref 38–126)
Anion gap: 8 (ref 5–15)
BUN: 8 mg/dL (ref 6–20)
CO2: 28 mmol/L (ref 22–32)
Calcium: 9.6 mg/dL (ref 8.9–10.3)
Chloride: 104 mmol/L (ref 98–111)
Creatinine: 0.93 mg/dL (ref 0.44–1.00)
GFR, Estimated: >60 mL/min (ref >60)
Glucose, Bld: 99 mg/dL (ref 70–99)
Potassium: 3.7 mmol/L (ref 3.5–5.1)
Sodium: 140 mmol/L (ref 135–145)
Total Bilirubin: 1.9 mg/dL — ABNORMAL HIGH (ref 0.3–1.2)
Total Protein: 7 g/dL (ref 6.5–8.1)

## 2021-01-17 LAB — CBC WITH DIFFERENTIAL (CANCER CENTER ONLY)
Abs Immature Granulocytes: 0.05 10*3/uL (ref 0.00–0.07)
Basophils Absolute: 0 10*3/uL (ref 0.0–0.1)
Basophils Relative: 1 %
Eosinophils Absolute: 0.1 10*3/uL (ref 0.0–0.5)
Eosinophils Relative: 2 %
HCT: 27.7 % — ABNORMAL LOW (ref 36.0–46.0)
Hemoglobin: 8.9 g/dL — ABNORMAL LOW (ref 12.0–15.0)
Immature Granulocytes: 2 %
Lymphocytes Relative: 16 %
Lymphs Abs: 0.5 10*3/uL — ABNORMAL LOW (ref 0.7–4.0)
MCH: 29.2 pg (ref 26.0–34.0)
MCHC: 32.1 g/dL (ref 30.0–36.0)
MCV: 90.8 fL (ref 80.0–100.0)
Monocytes Absolute: 0.3 10*3/uL (ref 0.1–1.0)
Monocytes Relative: 10 %
Neutro Abs: 2.2 10*3/uL (ref 1.7–7.7)
Neutrophils Relative %: 69 %
Platelet Count: 160 10*3/uL (ref 150–400)
RBC: 3.05 MIL/uL — ABNORMAL LOW (ref 3.87–5.11)
RDW: 16.9 % — ABNORMAL HIGH (ref 11.5–15.5)
WBC Count: 3.2 10*3/uL — ABNORMAL LOW (ref 4.0–10.5)
nRBC: 0 % (ref 0.0–0.2)

## 2021-01-17 MED ORDER — HEPARIN SOD (PORK) LOCK FLUSH 100 UNIT/ML IV SOLN
500.0000 [IU] | Freq: Once | INTRAVENOUS | Status: AC | PRN
  Administered 2021-01-17: 500 [IU]

## 2021-01-17 MED ORDER — LORATADINE 10 MG PO TABS
10.0000 mg | ORAL_TABLET | Freq: Every day | ORAL | Status: DC
  Administered 2021-01-17: 10 mg via ORAL
  Filled 2021-01-17: qty 1

## 2021-01-17 MED ORDER — SODIUM CHLORIDE 0.9 % IV SOLN: Freq: Once | INTRAVENOUS | Status: AC

## 2021-01-17 MED ORDER — SODIUM CHLORIDE 0.9% FLUSH
10.0000 mL | INTRAVENOUS | Status: DC | PRN
  Administered 2021-01-17: 10 mL

## 2021-01-17 MED ORDER — PROCHLORPERAZINE MALEATE 10 MG PO TABS
10.0000 mg | ORAL_TABLET | Freq: Once | ORAL | Status: AC
  Administered 2021-01-17: 10 mg via ORAL
  Filled 2021-01-17: qty 1

## 2021-01-17 MED ORDER — SODIUM CHLORIDE 0.9% FLUSH
10.0000 mL | Freq: Once | INTRAVENOUS | Status: AC
  Administered 2021-01-17: 10 mL

## 2021-01-17 MED ORDER — VINORELBINE TARTRATE CHEMO INJECTION 50 MG/5ML
18.0000 mg/m2 | Freq: Once | INTRAVENOUS | Status: AC
  Administered 2021-01-17: 30 mg via INTRAVENOUS
  Filled 2021-01-17: qty 3

## 2021-01-17 MED ORDER — ALTEPLASE 2 MG IJ SOLR
2.0000 mg | Freq: Once | INTRAMUSCULAR | Status: AC | PRN
  Administered 2021-01-17: 2 mg
  Filled 2021-01-17: qty 2

## 2021-01-17 MED ORDER — TRASTUZUMAB-DKST CHEMO 150 MG IV SOLR
6.0000 mg/kg | Freq: Once | INTRAVENOUS | Status: AC
  Administered 2021-01-17: 357 mg via INTRAVENOUS
  Filled 2021-01-17: qty 17

## 2021-01-17 MED ORDER — ACETAMINOPHEN 325 MG PO TABS
650.0000 mg | ORAL_TABLET | Freq: Once | ORAL | Status: AC
  Administered 2021-01-17: 650 mg via ORAL
  Filled 2021-01-17: qty 2

## 2021-01-17 NOTE — Progress Notes (Signed)
[facility]  [phone] [phone]   Name: [name] Date: 01/17/2021 MRN: [id number]  DOB: 26-Sep-1965  Patient Care Team: [name], Debra Lesches, [name] (Inactive) as PCP - General (Physician Assistant) Debra Killings, MD as Consulting Physician (Orthopedic Surgery) [name], Debra Sax, NP as Nurse Practitioner (Nurse Practitioner)    INTERVAL HISTORY: [name] is a 56 y.o. female with  metastatic right breast (initial diagnosis 11/1997) s/[name] right mastectomy, chemotherapy, currently receiving vinorelbine, GERD, vertigo, congenital deafness, chemo-induced neuropathy, osteopenia, depression, anxiety, and cancer-related pain.  Palliative ask to see for symptom management and goals of care.   SOCIAL HISTORY:     reports that she has never smoked. She has never used smokeless tobacco. She reports that she does not drink alcohol and does not use drugs.  ADVANCE DIRECTIVES:  Patient does not have a completed advanced directive. She understands her husband, [name]  is considered HCPOA in the absence of such documents. Is aware of advanced directives clinic if she would like for [place] to make an appointment.  CODE STATUS: DNR  PAST MEDICAL HISTORY: Past Medical History:  Diagnosis Date   Abnormal Pap smear    Anxiety    Breast cancer ([place])    Cancer ([place]) 1999   breast-s/[name] mastectomy, chemo, rad   CIN I (cervical intraepithelial neoplasia I) 2003   by colpo   Congenital deafness    Deaf    Depression    History of radiation therapy    Right femur, Left hip- 12/09/20-12/25/20- Dr. Gery [name]   History of radiation therapy    right pelvis/right femur 09/28/2017-10/11/2017  Dr [name]   Port-A-Cath in place 2017   for chemotherapy   Radiation 07/12/15-07/26/15   left femur 30 Gy   S/[name] bilateral oophorectomy     ALLERGIES:  is allergic to promethazine hcl, chlorhexidine, and diphenhydramine  hcl.  MEDICATIONS:  Current Outpatient Medications  Medication Sig Dispense Refill   dexamethasone (DECADRON) 4 MG tablet Take 1 tablet (4 mg total) by mouth daily. For 3-5 days after chemo (Patient not taking: No sig reported) 30 tablet 0   HYDROmorphone (DILAUDID) 2 MG tablet Take 1 tablet (2 mg total) by mouth every 6 (six) hours as needed for severe pain. 45 tablet 0   lidocaine-prilocaine (EMLA) cream Apply 1 application topically as needed. Apply to Porta-Cath 1-2 hours prior to access as directed. 90 g 2   LORazepam (ATIVAN) 0.5 MG tablet Take 1 tablet (0.5 mg total) by mouth every 8 (eight) hours as needed for anxiety (nausea). (Patient not taking: Reported on 11/20/2020) 20 tablet 0   megestrol (MEGACE) 400 MG/10ML suspension Take 10 mLs (400 mg total) by mouth daily. 240 mL 0   morphine (MS CONTIN) 15 MG 12 hr tablet Take 1 tablet (15 mg total) by mouth every 12 (twelve) hours. 30 tablet 0   potassium chloride (KLOR-CON) 10 MEQ tablet Take 1 tablet (10 mEq total) by mouth 3 (three) times daily. 90 tablet 2   prochlorperazine (COMPAZINE) 10 MG tablet TAKE 1 TABLET(10 MG) BY MOUTH EVERY 6 HOURS AS NEEDED FOR NAUSEA OR VOMITING 60 tablet 1   zolpidem (AMBIEN) 10 MG tablet Take 1 tablet (10 mg total) by mouth at bedtime as needed for sleep. 30 tablet 0   No current facility-administered medications for this visit.   Facility-Administered Medications Ordered in Other Visits  Medication Dose Route Frequency Provider Last Rate Last Admin   loratadine (  CLARITIN) tablet 10 mg  10 mg Oral Daily Debra Merle, MD   10 mg at 01/17/21 1128   potassium chloride SA (K-DUR) CR tablet 40 mEq  40 mEq Oral Once Debra Merle, MD       sodium chloride 0.9 % 1,000 mL with potassium chloride 10 mEq infusion   Intravenous Continuous [name], Debra Julian, MD   Stopped at 09/10/15 1653   sodium chloride 0.9 % injection 10 mL  10 mL Intravenous PRN [name], Debra P, MD       sodium chloride flush (NS) 0.9 % injection 10  mL  10 mL Intracatheter PRN Debra Merle, MD   10 mL at 01/17/21 1320    VITAL SIGNS: There were no vitals taken for this visit. There were no vitals filed for this visit.  Estimated body mass index is 20.58 kg/m as calculated from the following:   Height as of 12/12/20: 5\' 6"  (1.676 m).   Weight as of 12/27/20: 127 lb 8 oz (57.8 kg).   PERFORMANCE STATUS (ECOG) : 2 - Symptomatic, <50% confined to bed   Physical Exam General: NAD, weak appearing  Cardiovascular: regular rate and rhythm Pulmonary: clear ant fields Skin: no rashes Neurological: Weakness but otherwise nonfocal  IMPRESSION:  I saw Mrs. [name] during her infusion today. She is resting in bed. Interpretor at the bedside. She appears weaker than previous visits. No family present today. No acute distress noted.   We discussed her rescheduled appointment due to "not feeling well". She reports she was weak and was having some pain. Seems to be sleeping more. Reports appetite remains poor, some days better than others.    Pain We discussed her current pain regimen at length.  Patient reports pain does not seem well controlled on current regimen.  I personally observed her home medications and we discussed at length each pain medication extensively including how to take, when to take, and purpose.  After discussions it appears patient was not taking medications as prescribed.  She was only taking her MS Contin ER once a day versus prescribed twice a day.  She is appropriately taking hydromorphone as needed.  Reports some relief when taking.  Reviewed again instructions on taking MS Contin twice daily with plans to follow-up next week and reevaluate pain.  Education provided on possibility of making further adjustments if no improvement.  Patient verbalized understanding and appreciation.     Fatigue [name] continues to endorse ongoing fatigue.  Feels this has worsened over the past several weeks.  She often sleeps during the day  and at night but with some interruptions due to pain.  She is unable to do household chores at this time due to her weakness and fatigue.  Expresses her husband and daughters generally provides most of the care that she needs.     Constipation Is having daily bowel movements. Is taking colace and Senna as needed.    Poor appetite/anorexia  She reports her appetite remains poor.  Reports due to lack of appetite is having a hard time taking a additional protein supplements.  She does not like Ensure but is able to tolerate Carnation instant breakfast.  Education provided on making efforts to eat small frequent meals throughout the day versus large meals in addition to snacking when she has hunger.     We discussed Her current illness and what it means in the larger context of Her on-going co-morbidities. Natural disease trajectory and expectations were discussed.  Lessly expresses frustration  regarding her diagnosis.  She speaks to her upcoming scans and awareness this may determine prognosis and future plans.  She is emotional when asked about her feelings regarding her overall health.  Upcoming appointment will be important in guiding further goals of care and wishes.  Encouraged her to bring her husband for additional support and understanding.  I discussed the importance of continued conversation with family and their medical providers regarding overall plan of care and treatment options, ensuring decisions are within the context of the patients values and GOCs.  PLAN: Continue Dilaudid 2 days 4 mg as needed for breakthrough pain MS Contin ER twice daily Ongoing education regarding appetite and increase in protein intake. Continue Colace and senna for bowel regimen. Ongoing goals of care discussions.  Patient has confirmed DNR/DNI.  Upcoming scans will determine further treatment options and provide some direction in regards to goals of care and complex decision making.  Patient encouraged to have  has been calm with her to her next appointment for support and understanding. I will plan to see her in 1-2 weeks in collaboration with other oncology appointments.   Patient expressed understanding and was in agreement with this plan. She also understands that She can call the clinic at any time with any questions, concerns, or complaints.   Time Total: 35 min.   Visit consisted of counseling and education dealing with the complex and emotionally intense issues of symptom management and palliative care in the setting of serious and potentially life-threatening illness.Greater than 50%  of this time was spent counseling and coordinating care related to the above assessment and plan.  Signed by: Alda Lea, AGPCNP-BC [facility]

## 2021-01-17 NOTE — Progress Notes (Signed)
[place]   [phone]) [phone] [phone]   Clinic Follow up Note   Patient Care Team: [name], [name], [name] (Inactive) as PCP - General (Physician Assistant) [name], MD as Consulting Physician (Orthopedic Surgery) Pickenpack-Cousar, Carlena Sax, NP as Nurse Practitioner (Nurse Practitioner)  Date of Service:  01/17/2021  CHIEF COMPLAINT: f/u of metastatic breast cancer  CURRENT THERAPY:  Trastuzumab D1, and Vinorelbine D1 and 8 every 21 days, starting 11/01/20  ASSESSMENT & PLAN:  [name] is a 56 y.o. female with   1. 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 She had right mastectomy, 4 cycles of AC-T and 5 years of Tamoxifen/AI. -In 06/2015 she had breast cancer recurrence metastatic to the bone.  -she has had multiple endocrine therapy and chemotherapy, overall does not tolerate treatment well and frequently required dose reduction -We switched her treatment to CMF and Keytruda on 08/02/20, and continued herceptin. Due to poor tolerance, I dropped the methotrexate and fluorouracil and delayed her third cycle to 09/20/20, so she received cytoxan, Beryle Flock, and herceptin. She tolerated much better with just some fatigue and mild headaches. -restaging PET 10/31/20 unfortunately showed significantly worsening disease. -she was switched to trastuzumab and vinorelbine on 11/01/20. She is tolerating well with nausea and fatigue, dose reduced after cycle 1  -she continues to have low quality of life-- sleeping a lot, depression, bone pain, low appetite. I expressed concern that she has missed several treatments, between the holidays and her fatigue/not feeling well. It is about time to obtain a new scan, and I expressed concern that her cancer is probably getting worsening. I ordered PET scan  -I discussed palliative care and hospice home care service,  pt states she does not want any stranger to visit her home  -Labs reviewed, overall stable. We will continue. She declines growth factor injection.   2. Symptom Management: nausea and vomiting with weight loss, depression with fatigue -with her increased bone pains, she has had more nausea and vomiting. Because of this, she has lost weight.  -she did not tolerate Boost or Ensure. We previously reviewed other options. I also prescribed Megace for her on 12/31/20 -she reports the compazine is helpful for her nausea. -she reports she feels depressed and is spending most of the day in bed. She declines social work/counselor at this time and declines medication.   3. Body pain, secondary to bone mets   -She is being treated with Zometa q34month. -She will continue oral calcium and Vit D and Prilosec for Acid Reflux.  -She has received palliative radiation therapy to the left femur (2017) and the right sacroiliac area (2019) under Dr. [name] -her hip and leg pain has increased recently. Restaging PET 10/31/20 showed worsening of the disease in the bones. She received another course of palliative radiation to the right femur and left hip 11/28-12/14/22. -she currently has MS contin and dilaudid for pain, which she does not feel is helping. She will see palliative care nurse NLexine Batontoday to discuss further.   4. Goal of care discussion, DNR/DNI -We previously reviewed goal of care and I recommend DNR/DNI, she agreed again 07/19/20. -I again encouraged her to start setting up her living will. Her husband Debra Carolinais automatically her HCPOA. She is considering changing her HCPOA to her daughter [name] whom she lives with.  -Though her care is palliative, our goal is to prolong  her life. She would like to continue treatment.   5. Congenital deafness   6. Mild anemia, secondary to chemotherapy and malignancy  -her hgb decreased to 7.8 on 09/13/20.  -improved off methotrexate and fluorouraci.  -her hgb has  gone back down from the trastuzumab/vinorelbine. Hgb stable at 8.9 today (01/17/21)   7. 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 Mild, only in her feet. -worsened with CMF, remains mild off treatment.     PLAN:   -proceed with C3 trastuzumab and vinorelbine today  -returns for day 8 next week -f/u and C4 in 3 weeks, with lab and PET several days before   No problem-specific Assessment & Plan notes found for this encounter.   SUMMARY OF ONCOLOGIC HISTORY: Oncology History Overview Note   Cancer Staging No matching staging information was found for the patient.     [name], right ([place])  11/1997 Cancer Diagnosis   Patient had 1.4 cm poorly differentiated right breast carcinoma diagnosed in Nov 1999 at age 69, 1/7 axillary nodes involved, ER/PR and HER 2 positive. She had mastectomy with the 7 axillary node evaluation, 4 cycles of adriamycin/ cytoxan followed by taxotere, then five years of tamoxifen thru June 2005 (no herceptin in 1999). She had bilateral oophorectomy in May 2005. She was briefly on aromatase inhibitor after tamoxifen, but discontinued this herself due to poor tolerance.   04/04/2015 Genetic Testing   Negative genetic testing on the breast/ovarian cancer pnael.  The Breast/Ovarian gene panel offered by [facility] includes sequencing and rearrangement analysis for the following 20 genes:  ATM, BARD1, BRCA1, BRCA2, BRIP1, CDH1, CHEK2, EPCAM, FANCC, MLH1, MSH2, MSH6, NBN, PALB2, PMS2, PTEN, RAD51C, RAD51D, TP53, and XRCC2.   06/26/2015 Pathology Results   Initial Path Report Bone, curettage, Left femur met, intramedullary subtroch tissue - METASTATIC ADENOCARCINOMA.  Estrogen Receptor: 95%, POSITIVE, STRONG STAINING INTENSITY Progesterone Receptor: 2%, POSITIVE, STRONG  HER2 (+), IHC 3+   06/26/2015 Surgery   Biopsy of metastatic tissue and stabilization with Affixus trochanteric nail, proximal and distal interlock of left femur by Dr. [name]     06/28/2015 Imaging   CT Chest w/ Contrast 1. Extensive right internal mammary chain lymphadenopathy consistent with metastatic breast cancer. 2. Lytic metastases involving the posterior elements at T1 with probable nondisplaced pathologic fracture of the spinous process. 3. No other evidence of thoracic metastatic disease. 4. Trace bilateral pleural effusions with associated bibasilar atelectasis. 5. Indeterminate right thyroid nodule, not previously imaged. This could be evaluated with thyroid ultrasound as clinically warranted.   06/29/2015 -  Chemotherapy   Zometa started monthly on 06/29/15 and switched to q93month from 01/08/17   07/01/2015 Imaging   Bone Scan 1. Increased activity noted throughout the left femur. Although metastatic disease cannot be excluded, these changes are most likely secondary to prior surgery. 2. No other focal abnormalities identified to suggest metastatic disease.   07/12/2015 - 07/26/2015 Radiation Therapy   Left femur, 30 Gy in 10 fractions by Dr. [name]   07/14/2015 Initial Diagnosis   [name], right ([place]   07/19/2015 Imaging   Initial PET Scan 1. Severe multifocal osseous metastatic disease, much greater than anticipated based on prior imaging. 2. Multifocal nodal metastases involving the right internal mammary, prevascular and subpectoral lymph nodes. No axillary pulmonary involvement identified. 3. No distant extra osseous metastases.   07/30/2015 - 11/29/2015 Chemotherapy   Docetaxel, Herceptin and Perjeta every 3 weeks X6 cycles, pt tolerated moderately well, restaging  scan showed excellent response    09/18/2015 Imaging   CT Head w/wo Contrast 1. No acute intracranial abnormality or significant interval change. 2. Stable minimal periventricular white matter hypoattenuation on the right. This may reflect a remote ischemic injury. 3. No evidence for metastatic disease to the brain. 4. No focal soft tissue lesion to  explain the patient's tenderness.   10/30/2015 Imaging   CT Abdomen Pelvis w/ Contrast 1. Few mildly prominent fluid-filled loops of small bowel scattered throughout the abdomen, with intraluminal fluid density within the distal colon. Given the provided history, findings are suggestive of acute enteritis/diarrheal illness. 2. No other acute intra-abdominal or pelvic process identified. 3. Widespread osseous metastatic disease, better evaluated on most recent PET-CT from 07/19/2015. No pathologic fracture or other complication. 4. 11 mm hypodensity within the left kidney. This lesion measures intermediate density, and is indeterminate. While this lesion is similar in size relative to recent studies, this is increased in size relative to prior study from 2012. Further evaluation with dedicated renal mass protocol CT and/or MRI is recommended for complete characterization.   12/2015 - 05/14/2016 Chemotherapy   Maintenance Herceptin and pejeta every 3 weeks stopped on 05/14/16 due to disease progression   12/25/2015 Imaging   Restaging PET Scan 1. Markedly improved skeletal activity, with only several faint foci of residual accentuated metabolic activity, but resolution of the vast majority of the previously extensive osseous metastatic disease. 2. Resolution of the prior right internal mammary and right prevascular lymph nodes. 3. Residual subcutaneous edema and edema along fascia planes in the left upper thigh. This remains somewhat more than I would expect for placement of an IM nail 6 weeks ago. If there is leg swelling further down, consider Doppler venous ultrasound to rule out left lower extremity DVT. I do not perceive an obvious difference in density between the pelvic and common femoral veins on the noncontrast CT data. 4. Chronic right maxillary and right sphenoid sinusitis.   01/16/2016 - 10/20/2017 Anti-estrogen oral therapy   Letrozole 2.5 mg daily. She was switched to exemestane due to  disease progression on 02/08/17. Stopped on 10/20/2017 when treatment changed to chemo    03/01/2016 Miscellaneous   Patient presented to ED following a fall; she reports she landed on her back and is now experiencing back pain. The patient was evaluated and discharge home same day with pain control.   05/01/2016 Imaging   CT CAP IMPRESSION: 1. Stable exam.  No new or progressive disease identified. 2. No evidence for residual or recurrent adenopathy within the chest. 3. Stable bone metastasis.   05/01/2016 Imaging   BONE SCAN IMPRESSION: 1. Small focal uptake involving the anterior rib ends of the left second and right fourth ribs, new since the prior bone scan. The location of this uptake is more suggestive of a traumatic or inflammatory etiology as opposed to metastatic disease. No other evidence of metastatic disease. 2. There are other areas of stable uptake which are most likely degenerative/reactive, including right shoulder and cervical spine uptake and uptake along the right femur adjacent to the ORIF hardware.   05/20/2016 Imaging   NM PET Skull to Thigh IMPRESSION: 1. There multiple metastatic foci in the bony pelvis, including some new abnormal foci compare to the prior PET-CT, compatible with mildly progressive bony metastatic disease. Also, a left T11 vertebral hypermetabolic metastatic lesion is observed, new compared to the prior exam. 2. No active extraosseous malignancy is currently identified. 3. Right anterior fourth rib healing fracture,  appears be   06/04/2016 - 01/08/2017 Chemotherapy   Second line chemotherapy Kadcyla every 3 weeks started on 06/04/16 and stopped on 01/08/17 due to mixed response.     10/05/2016 Imaging   CT CAP and Bone Scan:  Bones: left intramedullary rod and left femoral head neck screw in place. In the proximal diaphysis of the left femur there is cortical thickening and irregularity. No lytic or blastic bone lesions. No fracture or  vertebral endplate destruction.   No definite evidence of recurrent disease in the chest, abdomen, or pelvis.   10/28/2016 Imaging   CT A/P IMPRESSION: No acute process demonstrated in the abdomen or pelvis. No evidence of bowel obstruction or inflammation.   11/04/2016 Imaging   DG foot complete left: IMPRESSION: No fracture or dislocation.  No soft tissue abnormality   11/23/2016 Imaging   CT RIGHT FEMUR IMPRESSION: Negative CT scan of the right thigh.  No visible metastatic disease.  CT LEFT FEMUR IMPRESSION: 1. Postsurgical changes related to prior cephalomedullary rod fixation of the left femur with linear lucency along the anterolateral proximal femoral cortex at level of the lesser trochanter, suspicious for nondisplaced fracture. 2. Slightly permeative appearance of the proximal left femoral cortex at the site of known prior osseous metastases is largely unchanged. 3. Unchanged patchy lucency and sclerosis along the anterior acetabulum, which appears to correspond with an area of faint uptake on the prior PET-CT, suspicious for osseous metastasis. These results will be called to the ordering clinician or representative by the Radiologist Assistant, and communication documented in the PACS or zVision Dashboard.       01/27/2017 Imaging   IMPRESSION: 1. Mixed response to osseous metastasis. Some hypermetabolic lesions are new and increasingly hypermetabolic, while another index lesion is decreasingly hypermetabolic. 2. No evidence of hypermetabolic soft tissue metastasis.     02/08/2017 - 10/19/2017 Antibody Plan   -Herceptin every 3 week since 02/08/17 -Oral Lanpantinib 1562m and decreased to 5085mdue to diarrhea and depression. Due to disease progression she swithced to Verzenio 15045mn 05/04/17. She did not toelrate well so she was switched to Ibrance 100m11m 05/31/17. Due to her neutropenia, Ibrance dose reduced to 75 mg daily, 3 weeks on, one-week off, stopped on  10/20/2017 due to disease progression.    04/30/2017 PET scan   IMPRESSION: 1. Primarily increase in hypermetabolism of osseous metastasis. An isolated right acetabular lesion is decreased in hypermetabolism since the prior exam. 2. No evidence of hypermetabolic soft tissue metastasis. 3. Similar non FDG avid right-sided thyroid nodule, favoring a benign etiology. Recommend attention on follow-up.   08/30/2017 PET scan   08/30/2017 PET Scan  IMPRESSION: 1. Worsening osseous metastatic disease. 2. Focal hypermetabolism in the central spinal canal the L1 level, similar to the prior exam. Metastatic implant cannot be excluded. 3. Slight enlargement of a right thyroid nodule which now appears more cystic in character. Consider further evaluation with thyroid ultrasound. If patient is clinically hyperthyroid, consider nuclear medicine thyroid uptake and scan   08/30/2017 Progression   08/30/2017 PET Scan  IMPRESSION: 1. Worsening osseous metastatic disease. 2. Focal hypermetabolism in the central spinal canal the L1 level, similar to the prior exam. Metastatic implant cannot be excluded. 3. Slight enlargement of a right thyroid nodule which now appears more cystic in character. Consider further evaluation with thyroid ultrasound. If patient is clinically hyperthyroid, consider nuclear medicine thyroid uptake and scan   09/29/2017 - 10/11/2017 Radiation Therapy   Pt received 27 Gy in 9  fractions directed at the areas of significant uptake noted on recent bone scan the right pelvis and right proximal femur, with Dr, Sondra Come    10/20/2017 - 04/07/2018 Chemotherapy   -Herceptin every 3 weeks starting 02/08/17, perjeta added on 10/20/2017  -weekly Taxol started on 10/21/2017. Will reduce Taxol to 2 weeks on, 1 week off starting on 02/04/17.  Stoped due to mild disease progression   10/27/2017 Imaging   10/27/2017 CT AP IMPRESSION: 1. No acute abnormality. No evidence of bowel obstruction or  acute bowel inflammation. Normal appendix. 2. Stable patchy sclerotic osseous metastases throughout the visualized skeleton. No new or progressive metastatic disease in the abdomen or pelvis.   01/11/2018 Imaging   Whole Body Bone Scan 01/11/18  IMPRESSION: 1. Multifocal osseous metastases as described. 2. Right scapular metastasis. 3. Right superior thoracic spinous process. 4. Right sacral ala and right L5 metastases. 5. Right acetabular metastasis. 6. Right proximal femur metastasis. 7. Diffuse metastases throughout the left femur. 8. Anterior right fourth rib metastasis.   01/11/2018 Imaging   CT CAP W Contrast 01/11/18  IMPRESSION: 1. Similar-appearing osseous metastatic disease. 2. No evidence for progressed metastatic disease in the chest, abdomen or pelvis.   03/30/2018 Imaging   Bone Scan 03/30/18  IMPRESSION: Stable multifocal bony metastatic disease.   03/30/2018 Imaging    CT CAP W Contrast 03/30/18 IMPRESSION: 1. New mild contiguous wall thickening with associated mucosal hyperenhancement throughout the left colon and rectum, suggesting nonspecific infectious or inflammatory proctocolitis. Differential includes C diff colitis. 2. Stable faint patchy sclerosis in the iliac bones and lumbar vertebral bodies. No new or progressive osseous metastatic disease by CT. Please note that the osseous lesions are relatively occult by CT and would be better evaluated by PET-CT, as clinically warranted. 3. No evidence of extra osseous metastatic disease.   These results will be called to the ordering clinician or representative by the Radiologist Assistant, and communication documented in the PACS or zVision Dashboard.   04/15/2018 - 06/16/2018 Chemotherapy   Enhertu q3weeks starting 04/15/18. Stopped Enhertu after cycle 3 on 05/27/18 due to poor toleration due to poor toleration with anorexia, weight loss, N&V, diarrhea   04/21/2018 PET scan   PET 04/21/18  IMPRESSION: 1.  Overall marked interval improvement in bony hypermetabolic disease. All of the previously identified hypermetabolic osseous metastases have decreased in hypermetabolism in the interval. There is a new tiny focus of hypermetabolism in the right aspect of the L3 vertebral body that appears to correspond to a new 8 mm lucency, raising concern for new metastatic disease. Close attention on follow-up recommended. 2. Focal hypermetabolism in the central spinal canal at the level of L1 previously has resolved. 3. No hypermetabolic soft tissue disease on today's study. 4. Right thyroid nodule seen previously has decreased in the interval.     06/16/2018 Imaging   CT LUMBAR SPINE WO CONTRAST 06/16/18 IMPRESSION: 1. Transitional lumbosacral anatomy. Correlation with radiographs is recommended prior to any operative intervention. 2. Suspicion of a right L3 vertebral body metastasis on the April PET-CT is not evident by CT. Stable degenerative versus metastatic lesion at the right L5 superior endplate. Small but increased right iliac bone metastasis since March. Lumbar MRI would best characterize lumbar metastatic disease (without contrast if necessary). 3. Suspect L2-L3 left subarticular segment disc herniation. Query left L2 radiculitis. 4. Stable appearance of rightward L4-L5 disc degeneration which could affect the right foramen and right lateral recess.   06/17/2018 - 11/16/2018 Chemotherapy   Restart Taxol  2 weeks on/1 week off with Herceptin and Perjeta q3weeks on 06/17/18. Perjeta was stopped after C5 due to diarrhea. Stopped Taxol after 11/16/18 due to disease progression to L3.    07/06/2018 PET scan   PET  IMPRESSION: 1. Near total resolution of hypermetabolic activity associated with skeletal metastasis. Very mild activity associated with the LEFT iliac bone which is similar to background blood pool activity. 2. No new or progressive disease.   11/21/2018 PET scan   IMPRESSION: 1. New  focal hypermetabolic lesion eccentric to the right in the L3 vertebral body, maximum SUV 6.1 compatible with a new focus of active malignancy. 2. Very low-grade activity similar to blood pool in previous existing mildly sclerotic metastatic lesions including the left iliac bone lesion. Some of the mildly sclerotic lesions are not hypermetabolic compatible with effectively treated bony metastatic disease. 3. No hypermetabolic soft tissue lesions are identified.     11/30/2018 - 08/25/2019 Chemotherapy   Continue Herceptin q3weeks. Stopped 08/25/19 due to significant disease progression and switched to  IV Matuzumab   12/12/2018 - 12/17/2018 Chemotherapy   Tucatinib (TUKYSA) 6 tabs BID as a next line therapy starting 12/12/18. Stopped 12/17/18 duet o N&V, poor appetite and HA    12/21/2018 - 03/17/2019 Chemotherapy   IV Eribulin on day 1, 8 every 21 days starting 12/21/18. Stopped 03/17/19 due to mixed response to treatment.    03/21/2019 PET scan   IMPRESSION: 1. Mixed response to therapy of osseous metastasis. Although a right-sided L4 lesion demonstrates decreased hypermetabolism today, new left iliac and right ischial hypermetabolic lesions are seen. 2. No evidence of hypermetabolic soft tissue metastasis.     03/27/2019 - 07/21/2019 Chemotherapy   Neratinib (Nerlynx) titrate up from 130m to 2432mstarting 03/27/19. She could not tolerate 16064mnd stopped on 02/05/19 due to N&V and diarrhea. Will restart at 120m6m 04/10/18-04/27/19 and 4/19-4/22. Reduced to 80mg22me daily on 05/10/19. Held/stopped since 07/21/19 due to dry cough, Nausea and body aches.   06/23/2019 Imaging   CT Head  IMPRESSION: 1. No acute intracranial abnormality or metastatic disease identified. 2. Chronic white matter disease in the right hemisphere is stable from prior exams.     09/05/2019 PET scan   IMPRESSION: 1. Significant progression of widespread hypermetabolic osseous metastases throughout the axial skeleton  as detailed. 2. No hypermetabolic extraosseous sites of metastatic disease.   09/15/2019 - 12/22/2019 Chemotherapy   -IV Margetuximab q3weeks and IV Gemcitabine 2 weeks on/1 week off starting 09/15/19. Carboplatin added to Gemcitabine starting with C3 on 11/03/19. D/c after C5 due to disease progression in bone.    12/13/2019 Imaging   PET  IMPRESSION: 1. Interval progression of widespread, FDG avid osseous metastasis involving the axial skeleton. 2. No FDG avid extra osseous sites of metastatic disease.     01/19/2020 -  Chemotherapy   -Xeloda BID 2 weeks on/1 week off and Herceptin q3weeks starting 01/19/20. C1 at 1000mg 80m C2 increased to 1500mg i75me AM and 1000mg in19m PM starting 02/12/20.   04/09/2020 Imaging   CT CAP  IMPRESSION: 1. New 3 mm RIGHT upper lobe nodule, nonspecific, attention on follow-up. 2. What may represent an incomplete periprosthetic fracture about the cephalo medullary rod in the LEFT proximal femur. This linear band of incomplete bony fusion that is more pronounced than on previous CT imaging that was acquired in 2017 and may represent a stress type fracture about hardware. Correlate with any pain in this area. Sclerotic margins show chronic  features but the linear band passing through the bone around the intramedullary rod is more extensive, involving approximately 270 degrees of the circumference of the femur, medial cortex remains intact. 3. Other findings of bony metastatic disease are similar to prior imaging. Sclerosis and some periosteal reaction about a nondisplaced pathologic fracture of the LEFT lateral seventh rib. 4. Small hypervascular focus in the RIGHT hepatic lobe, indistinct but new compared to previous imaging. Perhaps this represents a transient phenomenon though is not imaged on the later phase to allow for confirmation. Liver MRI may be helpful for further evaluation, to exclude underlying metastatic lesion as clinically warranted. 5.  Hepatic steatosis. 6. Aortic atherosclerosis.   Aortic Atherosclerosis (ICD10-I70.0).     07/18/2020 Imaging   PET  IMPRESSION: No signs of visceral or nodal metastatic disease.   Increased number of hypermetabolic lesions with increased intensity of many of these areas and increased "size" of affected areas based on the regions of FDG uptake. While there are areas that do show diminished FDG uptake such as in the RIGHT scapula on balance the number and size of these areas has increased.   08/02/2020 - 10/11/2020 Chemotherapy   Patient is on Treatment Plan : BREAST Adjuvant CMF IV q21d     10/31/2020 PET scan   IMPRESSION: Interval worsening of disease with new areas of increased metabolic activity associated with LEFT hilar and infrahilar lymph nodes and on balance worsening of hypermetabolic areas within the spine, pelvis and appendicular skeleton as discussed.   Of particular note is the marked interval worsening of disease in the RIGHT proximal femur, no current visible lesion other than subtle sclerosis is noted in this area, attention to this and other substantial load bearing areas on subsequent imaging.   No additional sites of new disease or visceral involvement.   11/01/2020 -  Chemotherapy   Patient is on Treatment Plan : BREAST METASTATIC Trastuzumab D1 + Vinorelbine D1,8 (25) q21d        INTERVAL HISTORY:  [name] is here for a follow up of metastatic breast cancer. She was last seen by me on 12/27/20. She was seen in the infusion area. She reports she is not feeling well. She states she is in bed most of the time, barely eating, in a lot of pain in her hips/pelvis, and unable to care for her grandkids. She reports she has been depressed lately, which made it difficulty to celebrate the holidays.   All other systems were reviewed with the patient and are negative.  MEDICAL HISTORY:  Past Medical History:  Diagnosis Date   Abnormal Pap smear    Anxiety     Breast cancer ([place])    Cancer ([place]) 1999   breast-s/p mastectomy, chemo, rad   CIN I (cervical intraepithelial neoplasia I) 2003   by colpo   Congenital deafness    Deaf    Depression    History of radiation therapy    Right femur, Left hip- 12/09/20-12/25/20- Dr. Gery Pray   History of radiation therapy    right pelvis/right femur 09/28/2017-10/11/2017  Dr Gery Pray   Port-A-Cath in place 2017   for chemotherapy   Radiation 07/12/15-07/26/15   left femur 30 Gy   S/P bilateral oophorectomy     SURGICAL HISTORY: Past Surgical History:  Procedure Laterality Date   ABDOMINAL HYSTERECTOMY     INTRAMEDULLARY (IM) NAIL INTERTROCHANTERIC Left 06/26/2015   Procedure: LEFT BIOMET LONG AFFIXS NAIL;  Surgeon: [name], MD;  Location:  [facility];  Service: Orthopedics;  Laterality: Left;   IR GENERIC HISTORICAL  08/06/2015   IR US GUIDE VASC ACCESS LEFT 08/06/2015 Aletta Edouard, MD [facility]   IR GENERIC HISTORICAL  08/06/2015   IR FLUORO GUIDE CV LINE LEFT 08/06/2015 Aletta Edouard, MD [facility]   IR GENERIC HISTORICAL  03/05/2016   IR CV LINE INJECTION 03/05/2016 Markus Daft, MD [facility]   LEFT HEART CATH AND CORONARY ANGIOGRAPHY N/A 12/13/2017   Procedure: LEFT HEART CATH AND CORONARY ANGIOGRAPHY;  Surgeon: Leonie Man, MD;  Location: [facility];  Service: Cardiovascular;  Laterality: N/A;   MASTECTOMY     right breast   MASTECTOMY     OVARIAN CYST REMOVAL     RADIOLOGY WITH ANESTHESIA Left 06/25/2015   Procedure: MRI OF LEFT HIP WITH OR WITHOUT CONTRAST;  Surgeon: Medication Radiologist, MD;  Location: [facility];  Service: Radiology;  Laterality: Left;  DR. MCINTYRE/MRI   TUBAL LIGATION      I have reviewed the social history and family history with the patient and they are unchanged from previous note.  ALLERGIES:  is allergic to promethazine hcl, chlorhexidine, and diphenhydramine hcl.  MEDICATIONS:  Current Outpatient Medications  Medication Sig Dispense  Refill   dexamethasone (DECADRON) 4 MG tablet Take 1 tablet (4 mg total) by mouth daily. For 3-5 days after chemo (Patient not taking: No sig reported) 30 tablet 0   HYDROmorphone (DILAUDID) 2 MG tablet Take 1 tablet (2 mg total) by mouth every 6 (six) hours as needed for severe pain. 45 tablet 0   lidocaine-prilocaine (EMLA) cream Apply 1 application topically as needed. Apply to Porta-Cath 1-2 hours prior to access as directed. 90 g 2   LORazepam (ATIVAN) 0.5 MG tablet Take 1 tablet (0.5 mg total) by mouth every 8 (eight) hours as needed for anxiety (nausea). (Patient not taking: Reported on 11/20/2020) 20 tablet 0   megestrol (MEGACE) 400 MG/10ML suspension Take 10 mLs (400 mg total) by mouth daily. 240 mL 0   morphine (MS CONTIN) 15 MG 12 hr tablet Take 1 tablet (15 mg total) by mouth every 12 (twelve) hours. 30 tablet 0   potassium chloride (KLOR-CON) 10 MEQ tablet Take 1 tablet (10 mEq total) by mouth 3 (three) times daily. 90 tablet 2   prochlorperazine (COMPAZINE) 10 MG tablet TAKE 1 TABLET(10 MG) BY MOUTH EVERY 6 HOURS AS NEEDED FOR NAUSEA OR VOMITING 60 tablet 1   zolpidem (AMBIEN) 10 MG tablet Take 1 tablet (10 mg total) by mouth at bedtime as needed for sleep. 30 tablet 0   No current facility-administered medications for this visit.   Facility-Administered Medications Ordered in Other Visits  Medication Dose Route Frequency Provider Last Rate Last Admin   loratadine (CLARITIN) tablet 10 mg  10 mg Oral Daily Truitt Merle, MD   10 mg at 01/17/21 1128   potassium chloride SA (K-DUR) CR tablet 40 mEq  40 mEq Oral Once Truitt Merle, MD       sodium chloride 0.9 % 1,000 mL with potassium chloride 10 mEq infusion   Intravenous Continuous Livesay, Lennis P, MD   Stopped at 09/10/15 1653   sodium chloride 0.9 % injection 10 mL  10 mL Intravenous PRN Livesay, Lennis P, MD       sodium chloride flush (NS) 0.9 % injection 10 mL  10 mL Intracatheter PRN Truitt Merle, MD   10 mL at 01/17/21 1320     PHYSICAL EXAMINATION: ECOG PERFORMANCE  STATUS: 2 - Symptomatic, <50% confined to bed  There were no vitals filed for this visit. Wt Readings from Last 3 Encounters:  12/27/20 127 lb 8 oz (57.8 kg)  12/12/20 126 lb 14.4 oz (57.6 kg)  11/22/20 131 lb 1.6 oz (59.5 kg)     GENERAL:alert, no distress and comfortable SKIN: skin color normal, no rashes or significant lesions EYES: normal, Conjunctiva are pink and non-injected, sclera clear  NEURO: alert & oriented x 3 with fluent speech  LABORATORY DATA:  I have reviewed the data as listed CBC Latest Ref Rng & Units 01/17/2021 12/27/2020 12/12/2020  WBC 4.0 - 10.5 K/uL 3.2(L) 3.4(L) 2.5(L)  Hemoglobin 12.0 - 15.0 g/dL 8.9(L) 8.9(L) 8.3(L)  Hematocrit 36.0 - 46.0 % 27.7(L) 26.9(L) 24.7(L)  Platelets 150 - 400 K/uL 160 155 186     CMP Latest Ref Rng & Units 01/17/2021 12/27/2020 12/12/2020  Glucose 70 - 99 mg/dL 99 96 109(H)  BUN 6 - 20 mg/dL 8 6 7   Creatinine 0.44 - 1.00 mg/dL 0.93 0.80 0.81  Sodium 135 - 145 mmol/L 140 142 138  Potassium 3.5 - 5.1 mmol/L 3.7 2.8(L) 3.6  Chloride 98 - 111 mmol/L 104 106 103  CO2 22 - 32 mmol/L 28 24 25   Calcium 8.9 - 10.3 mg/dL 9.6 8.8(L) 9.4  Total Protein 6.5 - 8.1 g/dL 7.0 6.4(L) 6.6  Total Bilirubin 0.3 - 1.2 mg/dL 1.9(H) 1.4(H) 1.8(H)  Alkaline Phos 38 - 126 U/L 76 70 87  AST 15 - 41 U/L 20 21 22   ALT 0 - 44 U/L 10 12 14       RADIOGRAPHIC STUDIES: I have personally reviewed the radiological images as listed and agreed with the findings in the report. No results found.    Orders Placed This Encounter  Procedures   NM PET Image Restag (PS) Skull Base To Thigh    Standing Status:   Future    Standing Expiration Date:   01/17/2022    Order Specific Question:   If indicated for the ordered procedure, I authorize the administration of a radiopharmaceutical per Radiology protocol    Answer:   Yes    Order Specific Question:   Is the patient pregnant?    Answer:   No    Order Specific  Question:   Preferred imaging location?    Answer:   [facility]   All questions were answered. The patient knows to call the clinic with any problems, questions or concerns. No barriers to learning was detected. The total time spent in the appointment was 30 minutes.     Truitt Merle, MD 01/17/2021   I, Wilburn Mylar, am acting as scribe for Truitt Merle, MD.   I have reviewed the above documentation for accuracy and completeness, and I agree with the above.

## 2021-01-17 NOTE — Patient Instructions (Signed)
[facility]  Discharge Instructions: Thank you for choosing [facility] to provide your oncology and hematology care.   If you have a lab appointment with the [facility], please go directly to the [facility] and check in at the registration area.   Wear comfortable clothing and clothing appropriate for easy access to any Portacath or PICC line.   We strive to give you quality time with your provider. You may need to reschedule your appointment if you arrive late (15 or more minutes).  Arriving late affects you and other patients whose appointments are after yours.  Also, if you miss three or more appointments without notifying the office, you may be dismissed from the clinic at the providers discretion.      For prescription refill requests, have your pharmacy contact our office and allow 72 hours for refills to be completed.    Today you received the following chemotherapy and/or immunotherapy agents: trastuzumab and vinorelbine      To help prevent nausea and vomiting after your treatment, we encourage you to take your nausea medication as directed.  BELOW ARE SYMPTOMS THAT SHOULD BE REPORTED IMMEDIATELY: *FEVER GREATER THAN 100.4 F (38 C) OR HIGHER *CHILLS OR SWEATING *NAUSEA AND VOMITING THAT IS NOT CONTROLLED WITH YOUR NAUSEA MEDICATION *UNUSUAL SHORTNESS OF BREATH *UNUSUAL BRUISING OR BLEEDING *URINARY PROBLEMS (pain or burning when urinating, or frequent urination) *BOWEL PROBLEMS (unusual diarrhea, constipation, pain near the anus) TENDERNESS IN MOUTH AND THROAT WITH OR WITHOUT PRESENCE OF ULCERS (sore throat, sores in mouth, or a toothache) UNUSUAL RASH, SWELLING OR PAIN  UNUSUAL VAGINAL DISCHARGE OR ITCHING   Items with * indicate a potential emergency and should be followed up as soon as possible or go to the Emergency Department if any problems should occur.  Please show the CHEMOTHERAPY ALERT CARD or IMMUNOTHERAPY ALERT  CARD at check-in to the Emergency Department and triage nurse.  Should you have questions after your visit or need to cancel or reschedule your appointment, please contact [facility]  Dept: [phone]  and follow the prompts.  Office hours are 8:00 a.m. to 4:30 p.m. Monday - Friday. Please note that voicemails left after 4:00 p.m. may not be returned until the following business day.  We are closed weekends and major holidays. You have access to a nurse at all times for urgent questions. Please call the main number to the clinic Dept: [phone] and follow the prompts.   For any non-urgent questions, you may also contact your provider using MyChart. We now offer e-Visits for anyone 6 and older to request care online for non-urgent symptoms. For details visit [email or web address].   Also download the MyChart app! Go to the app store, search "MyChart", open the app, select [facility], and log in with your MyChart username and password.  Due to Covid, a mask is required upon entering the hospital/clinic. If you do not have a mask, one will be given to you upon arrival. For doctor visits, patients may have 1 support person aged 56 or older with them. For treatment visits, patients cannot have anyone with them due to current Covid guidelines and our immunocompromised population.

## 2021-01-18 LAB — CANCER ANTIGEN 27.29: CA 27.29: 133.6 U/mL — ABNORMAL HIGH (ref 0.0–38.6)

## 2021-01-20 ENCOUNTER — Telehealth: Payer: Self-pay | Admitting: Nurse Practitioner

## 2021-01-20 ENCOUNTER — Telehealth: Payer: Self-pay | Admitting: Hematology

## 2021-01-20 NOTE — Telephone Encounter (Signed)
Left message with cancelled upcoming injection appointments per provider's request. Patient will get Onpro patch after infusion.

## 2021-01-20 NOTE — Telephone Encounter (Signed)
Scheduled per 1/6 los, message was left with pt via interpreter

## 2021-01-22 NOTE — Progress Notes (Incomplete)
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   [name]; recent disease progression    Intent: Palliative  Radiation Treatment Dates: 12/09/2020 through 12/25/2020 Site Technique Total Dose (Gy) Dose per Fx (Gy) Completed Fx Beam Energies  Femur Right: Ext_Rt Complex 25/25 2.5 10/10 10X  Hip, Left: Pelvis_Lt IMRT 30/30 3 10/10 6X   Narrative: The patient tolerated radiation therapy relatively well. She reports pain to the left side near her ribs, a knot on her right hip, fatigue, ongoing nausea, vomiting, and diarrhea, incomplete voiding, and issues with gait (using cane).  Her pain in the right hip area seems to have improved over the past several days.  She does not report much pain within the left pelvis region.  Hopefully her issues with nausea and diarrhea will improve if this has been related to her radiation therapy.  Plan: The patient will follow-up with radiation oncology in one month .  ________________________________________________ -----------------------------------  Blair Promise, PhD, MD  This document serves as a record of services personally performed by Gery Pray, MD. It was created on his behalf by Roney Mans, a trained medical scribe. The creation of this record is based on the scribe's personal observations and the provider's statements to them. This document has been checked and approved by the attending  provider.

## 2021-01-22 NOTE — Progress Notes (Incomplete)
Radiation Oncology         [phone] ________________________________  Name: [name] MRN: [id number]  Date: 01/23/2021  DOB: January 15, 1965  Follow-Up Visit Note  CC: [name], [name], [name] (Inactive)  Truitt Merle, MD  No diagnosis found.  Diagnosis:  The encounter diagnosis was [name], right ([place]).   [name]; recent disease progression    Interval Since Last Radiation:  29 days   3) Intent: Palliative Radiation Treatment Dates: 12/09/2020 through 12/25/2020 Site Technique Total Dose (Gy) Dose per Fx (Gy) Completed Fx Beam Energies  Femur Right: Ext_Rt Complex 25/25 2.5 10/10 10X  Hip, Left: Pelvis_Lt IMRT 30/30 3 10/10 6X   2) Indication for treatment:  Palliative      Radiation treatment dates:   09/28/17 - 10/11/17 Site/dose:   Pelvis, Right, Sacro-[place]iac/ 27 Gy in 9 fractions of 3 Gy Beams/energy:   3D, Photon/ 6X, 15X  1) Indication for treatment:  Palliative     Radiation treatment dates:   07/12/2015-07/26/2015 Site/dose:   Left femur, 30 Gy in 10 fractions.   Beams/energy:   Parallel Opposed, 10X, 6X  Narrative:  The patient returns today for routine follow-up. The patient tolerated radiation therapy relatively well. She reported pain to the left side near her ribs, a knot on her right hip, fatigue, ongoing nausea, vomiting, and diarrhea, incomplete voiding, and issues with gait (using cane). Her pain in the right hip area seemed to have improved over the last several days of RT, and she did not report much pain within the left pelvis region on her final day of treatment.                          Since her most recent re-consult on 11/20/20, the patient met with Dr. Burr Medico on 11/22/20 prior to starting cycle 2 of her systemic treatment. Given her cytopenias, the patient underwent dose reduced treatment for cycle 2 (trastuzumab and vinorelbine). The patient was also set up to receive Granix  injections on 11/14 and 11/21. 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   The patient later presented to [name], on 12/12/20, with the chief complaint of nausea and vomiting as well as pain in her right hip. She reported that pain as being present x 2 days, constant, and excruciating in severity. The patient reported taking dilaudid and MS contin for pain with minimal relief, and near falls because of the pain. She also attributed some nausea and vomiting to the pain as well, and reported 2 episodes of NBNB emesis that day. Femoral XR performed that day showed sclerotic metastasis in the proximal right femur and right inferor pubic rami as similar to PET on 10/31/20.Marland Kitchen Overall, physical exam was negative and her presenting symptoms were noted as likely due to uncontrolled pain, and she was given IV morphine, zofran and IVF. Morphine seemed to manage her pain quite well.  During her most recent follow up with Dr. Burr Medico on 01/17/21, the patient reported not feeling well; spending most of her time in bed lately, decreased p.o intake, depression, and severe pain in her hip/pelvis. Patient noted that her pain has made her unable to care for her grandchildren. Dr. Burr Medico offered referring the patient to a counselor and medication for her depression though the patient declined both. The patient did state that compazine has been helpful for her  nausea, though reported that MS contin and dilaudid have been unhelpful for pain management (pt later met with palliative care nurse to reevaluate pain medication). The patient is still being treated with zometa every 3 months as well, and will continue oral calcium and Vit D and Prilosec for Acid Reflux.   Allergies:  is allergic to promethazine hcl, chlorhexidine, and diphenhydramine hcl.  Meds: Current Outpatient Medications  Medication Sig Dispense Refill   dexamethasone (DECADRON) 4 MG tablet Take 1 tablet (4 mg  total) by mouth daily. For 3-5 days after chemo (Patient not taking: No sig reported) 30 tablet 0   HYDROmorphone (DILAUDID) 2 MG tablet Take 1 tablet (2 mg total) by mouth every 6 (six) hours as needed for severe pain. 45 tablet 0   lidocaine-prilocaine (EMLA) cream Apply 1 application topically as needed. Apply to Porta-Cath 1-2 hours prior to access as directed. 90 g 2   LORazepam (ATIVAN) 0.5 MG tablet Take 1 tablet (0.5 mg total) by mouth every 8 (eight) hours as needed for anxiety (nausea). (Patient not taking: Reported on 11/20/2020) 20 tablet 0   megestrol (MEGACE) 400 MG/10ML suspension Take 10 mLs (400 mg total) by mouth daily. 240 mL 0   morphine (MS CONTIN) 15 MG 12 hr tablet Take 1 tablet (15 mg total) by mouth every 12 (twelve) hours. 30 tablet 0   potassium chloride (KLOR-CON) 10 MEQ tablet Take 1 tablet (10 mEq total) by mouth 3 (three) times daily. 90 tablet 2   prochlorperazine (COMPAZINE) 10 MG tablet TAKE 1 TABLET(10 MG) BY MOUTH EVERY 6 HOURS AS NEEDED FOR NAUSEA OR VOMITING 60 tablet 1   zolpidem (AMBIEN) 10 MG tablet Take 1 tablet (10 mg total) by mouth at bedtime as needed for sleep. 30 tablet 0   No current facility-administered medications for this encounter.   Facility-Administered Medications Ordered in Other Encounters  Medication Dose Route Frequency Provider Last Rate Last Admin   potassium chloride SA (K-DUR) CR tablet 40 mEq  40 mEq Oral Once Truitt Merle, MD       sodium chloride 0.9 % 1,000 mL with potassium chloride 10 mEq infusion   Intravenous Continuous Livesay, Lennis P, MD   Stopped at 09/10/15 1653   sodium chloride 0.9 % injection 10 mL  10 mL Intravenous PRN Gordy Levan, MD        Physical Findings: The patient is in no acute distress. Patient is alert and oriented.  vitals were not taken for this visit. .  No significant changes. Lungs are clear to auscultation bilaterally. Heart has regular rate and rhythm. No palpable cervical, supraclavicular, or  axillary adenopathy. Abdomen soft, non-tender, normal bowel sounds. ***   Lab Findings: Lab Results  Component Value Date   WBC 3.2 (L) 01/17/2021   HGB 8.9 (L) 01/17/2021   HCT 27.7 (L) 01/17/2021   MCV 90.8 01/17/2021   PLT 160 01/17/2021    Radiographic Findings: No results found.  Impression:  The encounter diagnosis was [name], right ([place]).   [name]; recent disease progression     The patient is recovering from the effects of radiation.  ***  Plan:  ***   *** minutes of total time was spent for this patient encounter, including preparation, face-to-face counseling with the patient and coordination of care, physical exam, and documentation of the encounter. ____________________________________  Blair Promise, PhD, MD  This document serves as a record of services personally  performed by Gery Pray, MD. It was created on his behalf by Roney Mans, a trained medical scribe. The creation of this record is based on the scribe's personal observations and the provider's statements to them. This document has been checked and approved by the attending provider.

## 2021-01-23 ENCOUNTER — Ambulatory Visit
Admission: RE | Admit: 2021-01-23 | Discharge: 2021-01-23 | Disposition: A | Discharge status: Home / Self Care | Payer: Medicaid Other | Source: Ambulatory Visit | Attending: Radiation Oncology | Admitting: Radiation Oncology

## 2021-01-23 ENCOUNTER — Telehealth: Payer: Self-pay | Admitting: *Deleted

## 2021-01-23 DIAGNOSIS — C50919 Malignant neoplasm of unspecified site of unspecified female breast: Secondary | ICD-10-CM | POA: Insufficient documentation

## 2021-01-23 DIAGNOSIS — Z510 Encounter for antineoplastic radiation therapy: Secondary | ICD-10-CM | POA: Insufficient documentation

## 2021-01-23 DIAGNOSIS — C7951 Secondary malignant neoplasm of bone: Secondary | ICD-10-CM | POA: Insufficient documentation

## 2021-01-23 HISTORY — DX: Personal history of irradiation: Z92.3

## 2021-01-23 NOTE — Telephone Encounter (Signed)
CALLED PATIENT TO ASK ABOUT RESCHEDULING FU FOR 01-23-21, LVM FOR A RETURN CALL

## 2021-01-24 ENCOUNTER — Other Ambulatory Visit: Payer: Self-pay | Admitting: Hematology

## 2021-01-24 ENCOUNTER — Inpatient Hospital Stay: Payer: Medicaid Other

## 2021-01-24 ENCOUNTER — Encounter: Payer: Self-pay | Admitting: Nurse Practitioner

## 2021-01-24 ENCOUNTER — Inpatient Hospital Stay (HOSPITAL_BASED_OUTPATIENT_CLINIC_OR_DEPARTMENT_OTHER): Payer: Medicaid Other | Admitting: Nurse Practitioner

## 2021-01-24 ENCOUNTER — Inpatient Hospital Stay (HOSPITAL_BASED_OUTPATIENT_CLINIC_OR_DEPARTMENT_OTHER): Payer: Medicaid Other | Admitting: Hematology

## 2021-01-24 ENCOUNTER — Other Ambulatory Visit: Payer: Self-pay

## 2021-01-24 ENCOUNTER — Ambulatory Visit (HOSPITAL_BASED_OUTPATIENT_CLINIC_OR_DEPARTMENT_OTHER): Payer: Medicaid Other | Admitting: Physician Assistant

## 2021-01-24 VITALS — BP 113/59 | HR 71 | Temp 97.9°F | Resp 18 | Wt 123.8 lb

## 2021-01-24 DIAGNOSIS — D6481 Anemia due to antineoplastic chemotherapy: Secondary | ICD-10-CM

## 2021-01-24 DIAGNOSIS — G893 Neoplasm related pain (acute) (chronic): Secondary | ICD-10-CM

## 2021-01-24 DIAGNOSIS — T451X5A Adverse effect of antineoplastic and immunosuppressive drugs, initial encounter: Secondary | ICD-10-CM

## 2021-01-24 DIAGNOSIS — C7951 Secondary malignant neoplasm of bone: Secondary | ICD-10-CM | POA: Diagnosis not present

## 2021-01-24 DIAGNOSIS — C50911 Malignant neoplasm of unspecified site of right female breast: Secondary | ICD-10-CM | POA: Diagnosis not present

## 2021-01-24 DIAGNOSIS — Z515 Encounter for palliative care: Secondary | ICD-10-CM

## 2021-01-24 DIAGNOSIS — R530 Neoplastic (malignant) related fatigue: Secondary | ICD-10-CM

## 2021-01-24 DIAGNOSIS — R110 Nausea: Secondary | ICD-10-CM

## 2021-01-24 DIAGNOSIS — Z95828 Presence of other vascular implants and grafts: Secondary | ICD-10-CM

## 2021-01-24 DIAGNOSIS — R531 Weakness: Secondary | ICD-10-CM | POA: Diagnosis not present

## 2021-01-24 DIAGNOSIS — Z5112 Encounter for antineoplastic immunotherapy: Secondary | ICD-10-CM | POA: Diagnosis not present

## 2021-01-24 LAB — CBC WITH DIFFERENTIAL (CANCER CENTER ONLY)
Abs Immature Granulocytes: 0.03 10*3/uL (ref 0.00–0.07)
Basophils Absolute: 0 10*3/uL (ref 0.0–0.1)
Basophils Relative: 1 %
Eosinophils Absolute: 0.1 10*3/uL (ref 0.0–0.5)
Eosinophils Relative: 2 %
HCT: 23.8 % — ABNORMAL LOW (ref 36.0–46.0)
Hemoglobin: 7.9 g/dL — ABNORMAL LOW (ref 12.0–15.0)
Immature Granulocytes: 1 %
Lymphocytes Relative: 27 %
Lymphs Abs: 0.6 10*3/uL — ABNORMAL LOW (ref 0.7–4.0)
MCH: 29.2 pg (ref 26.0–34.0)
MCHC: 33.2 g/dL (ref 30.0–36.0)
MCV: 87.8 fL (ref 80.0–100.0)
Monocytes Absolute: 0.2 10*3/uL (ref 0.1–1.0)
Monocytes Relative: 7 %
Neutro Abs: 1.3 10*3/uL — ABNORMAL LOW (ref 1.7–7.7)
Neutrophils Relative %: 62 %
Platelet Count: 165 10*3/uL (ref 150–400)
RBC: 2.71 MIL/uL — ABNORMAL LOW (ref 3.87–5.11)
RDW: 16 % — ABNORMAL HIGH (ref 11.5–15.5)
WBC Count: 2.1 10*3/uL — ABNORMAL LOW (ref 4.0–10.5)
nRBC: 1 % — ABNORMAL HIGH (ref 0.0–0.2)

## 2021-01-24 LAB — CMP (CANCER CENTER ONLY)
ALT: 14 U/L (ref 0–44)
AST: 21 U/L (ref 15–41)
Albumin: 3.9 g/dL (ref 3.5–5.0)
Alkaline Phosphatase: 69 U/L (ref 38–126)
Anion gap: 10 (ref 5–15)
BUN: 6 mg/dL (ref 6–20)
CO2: 27 mmol/L (ref 22–32)
Calcium: 9.4 mg/dL (ref 8.9–10.3)
Chloride: 103 mmol/L (ref 98–111)
Creatinine: 0.93 mg/dL (ref 0.44–1.00)
GFR, Estimated: >60 mL/min (ref >60)
Glucose, Bld: 95 mg/dL (ref 70–99)
Potassium: 3.2 mmol/L — ABNORMAL LOW (ref 3.5–5.1)
Sodium: 140 mmol/L (ref 135–145)
Total Bilirubin: 1.3 mg/dL — ABNORMAL HIGH (ref 0.3–1.2)
Total Protein: 6.7 g/dL (ref 6.5–8.1)

## 2021-01-24 LAB — PREPARE RBC (CROSSMATCH)

## 2021-01-24 LAB — ABO/RH: ABO/RH(D): O POS

## 2021-01-24 MED ORDER — PROCHLORPERAZINE MALEATE 10 MG PO TABS
10.0000 mg | ORAL_TABLET | Freq: Once | ORAL | Status: AC
  Administered 2021-01-24: 10 mg via ORAL
  Filled 2021-01-24: qty 1

## 2021-01-24 MED ORDER — HEPARIN SOD (PORK) LOCK FLUSH 100 UNIT/ML IV SOLN
500.0000 [IU] | Freq: Once | INTRAVENOUS | Status: AC | PRN
  Administered 2021-01-24: 500 [IU]

## 2021-01-24 MED ORDER — VINORELBINE TARTRATE CHEMO INJECTION 50 MG/5ML
18.0000 mg/m2 | Freq: Once | INTRAVENOUS | Status: AC
  Administered 2021-01-24: 30 mg via INTRAVENOUS
  Filled 2021-01-24: qty 3

## 2021-01-24 MED ORDER — SODIUM CHLORIDE 0.9 % IV SOLN: Freq: Once | INTRAVENOUS | Status: AC

## 2021-01-24 MED ORDER — LORATADINE 10 MG PO TABS
10.0000 mg | ORAL_TABLET | Freq: Once | ORAL | Status: AC
  Administered 2021-01-24: 10 mg via ORAL
  Filled 2021-01-24: qty 1

## 2021-01-24 MED ORDER — SODIUM CHLORIDE 0.9% FLUSH
10.0000 mL | Freq: Once | INTRAVENOUS | Status: AC
  Administered 2021-01-24: 10 mL

## 2021-01-24 MED ORDER — SODIUM CHLORIDE 0.9% FLUSH
10.0000 mL | INTRAVENOUS | Status: DC | PRN
  Administered 2021-01-24: 10 mL

## 2021-01-24 MED ORDER — ACETAMINOPHEN 325 MG PO TABS
650.0000 mg | ORAL_TABLET | Freq: Four times a day (QID) | ORAL | Status: DC | PRN
  Administered 2021-01-24: 650 mg via ORAL

## 2021-01-24 MED ORDER — HYDROMORPHONE HCL 2 MG PO TABS: 2.0000 mg | ORAL_TABLET | Freq: Four times a day (QID) | ORAL | 0 refills | Status: DC | PRN

## 2021-01-24 MED ORDER — SODIUM CHLORIDE 0.9% IV SOLUTION
250.0000 mL | Freq: Once | INTRAVENOUS | Status: AC
  Administered 2021-01-24: 250 mL via INTRAVENOUS

## 2021-01-24 MED ORDER — ZOLEDRONIC ACID 4 MG/100ML IV SOLN
4.0000 mg | Freq: Once | INTRAVENOUS | Status: AC
  Administered 2021-01-24: 4 mg via INTRAVENOUS
  Filled 2021-01-24: qty 100

## 2021-01-24 MED ORDER — PEGFILGRASTIM 6 MG/0.6ML ~~LOC~~ PSKT
6.0000 mg | PREFILLED_SYRINGE | Freq: Once | SUBCUTANEOUS | Status: AC
  Administered 2021-01-24: 6 mg via SUBCUTANEOUS
  Filled 2021-01-24: qty 0.6

## 2021-01-24 MED ORDER — LIDOCAINE 5 % EX PTCH: 1.0000 | MEDICATED_PATCH | CUTANEOUS | 0 refills | Status: DC

## 2021-01-24 NOTE — Progress Notes (Signed)
DATE:  01/24/21                                        X TRANSFUSION REACTION     MD: Burr Medico   AGENT/BLOOD PRODUCT RECEIVING TODAY:              1 unit PRBC   AGENT/BLOOD PRODUCT RECEIVING IMMEDIATELY PRIOR TO REACTION:          PRBC   VS: BP:     114/62   P:       71       SPO2:       99% on room air               BP:     126/64   P:       75       SPO2:       100% on room air     REACTION(S):           nausea and chest pain   PREMEDS:     compazine 10 mg PO   INTERVENTION: Tylenol 650 mg PO   Review of Systems  Review of Systems  Constitutional:  Negative for chills, diaphoresis, fever and unexpected weight change.  HENT:  Negative for facial swelling.   Eyes:  Negative for visual disturbance.  Respiratory:  Negative for cough, choking, chest tightness, shortness of breath, wheezing and stridor.   Cardiovascular:  Positive for chest pain. Negative for palpitations.  Gastrointestinal:  Positive for nausea. Negative for abdominal pain and vomiting.  Skin:  Negative for color change.  Neurological:  Negative for dizziness, seizures, weakness and numbness.    Physical Exam  Physical Exam Vitals and nursing note reviewed.  Constitutional:      Appearance: She is well-developed. She is not ill-appearing or toxic-appearing.     Comments: Frail, chronically ill appearing  HENT:     Head: Normocephalic and atraumatic.     Nose: Nose normal.  Eyes:     General: No scleral icterus.       Right eye: No discharge.        Left eye: No discharge.     Conjunctiva/sclera: Conjunctivae normal.  Neck:     Vascular: No JVD.  Cardiovascular:     Rate and Rhythm: Normal rate and regular rhythm.     Pulses: Normal pulses.     Heart sounds: Normal heart sounds.  Pulmonary:     Effort: Pulmonary effort is normal.     Breath sounds: Normal breath sounds.  Chest:     Chest wall: No tenderness.     Comments: Port without signs of infection Abdominal:     General: There is no  distension.     Palpations: Abdomen is soft. There is no mass.     Tenderness: There is no abdominal tenderness. There is no guarding or rebound.     Hernia: No hernia is present.  Musculoskeletal:        General: Normal range of motion.     Cervical back: Normal range of motion.     Right lower leg: No edema.     Left lower leg: No edema.  Skin:    General: Skin is warm and dry.     Capillary Refill: Capillary refill takes less than 2 seconds.     Findings: No rash.  Neurological:     Mental Status: She is oriented to person, place, and time.     GCS: GCS eye subscore is 4. GCS verbal subscore is 5. GCS motor subscore is 6.     Comments: Fluent speech, no facial droop.  Psychiatric:        Behavior: Behavior normal.    OUTCOME:                Patient reported nausea and chest pain to RN. At the time of my exam nausea had resolved. Patient received compazine as a premed and had not yet eaten today. She was eating saltine and graham crackers without difficulty. Patient reports to me chest pain feels like her typical pain and denies any SOB. Patient given tylenol and chest pain resolved. She tolerated remainder of blood transfusion without difficulty. MD made aware and was agreeable with plan of care.

## 2021-01-24 NOTE — Progress Notes (Signed)
Per Dr. Burr Medico - okay to proceed with treatment today with low hemoglobin and low ANC

## 2021-01-24 NOTE — Patient Instructions (Signed)
[facility]   ?Discharge Instructions: ?Thank you for choosing [facility] to provide your oncology and hematology care.  ? ?If you have a lab appointment with the Cancer Center, please go directly to the Cancer Center and check in at the registration area. ?  ?Wear comfortable clothing and clothing appropriate for easy access to any Portacath or PICC line.  ? ?We strive to give you quality time with your provider. You may need to reschedule your appointment if you arrive late (15 or more minutes).  Arriving late affects you and other patients whose appointments are after yours.  Also, if you miss three or more appointments without notifying the office, you may be dismissed from the clinic at the provider?s discretion.    ?  ?For prescription refill requests, have your pharmacy contact our office and allow 72 hours for refills to be completed.   ? ?Today you received the following chemotherapy and/or immunotherapy agents: Vinorelbine (Navelbine)    ?  ?To help prevent nausea and vomiting after your treatment, we encourage you to take your nausea medication as directed. ? ?BELOW ARE SYMPTOMS THAT SHOULD BE REPORTED IMMEDIATELY: ?*FEVER GREATER THAN 100.4 F (38 ?C) OR HIGHER ?*CHILLS OR SWEATING ?*NAUSEA AND VOMITING THAT IS NOT CONTROLLED WITH YOUR NAUSEA MEDICATION ?*UNUSUAL SHORTNESS OF BREATH ?*UNUSUAL BRUISING OR BLEEDING ?*URINARY PROBLEMS (pain or burning when urinating, or frequent urination) ?*BOWEL PROBLEMS (unusual diarrhea, constipation, pain near the anus) ?TENDERNESS IN MOUTH AND THROAT WITH OR WITHOUT PRESENCE OF ULCERS (sore throat, sores in mouth, or a toothache) ?UNUSUAL RASH, SWELLING OR PAIN  ?UNUSUAL VAGINAL DISCHARGE OR ITCHING  ? ?Items with * indicate a potential emergency and should be followed up as soon as possible or go to the Emergency Department if any problems should occur. ? ?Please show the CHEMOTHERAPY ALERT CARD or IMMUNOTHERAPY ALERT CARD  at check-in to the Emergency Department and triage nurse. ? ?Should you have questions after your visit or need to cancel or reschedule your appointment, please contact [facility]  Dept: [phone]  and follow the prompts.  Office hours are 8:00 a.m. to 4:30 p.m. Monday - Friday. Please note that voicemails left after 4:00 p.m. may not be returned until the following business day.  We are closed weekends and major holidays. You have access to a nurse at all times for urgent questions. Please call the main number to the clinic Dept: [phone] and follow the prompts. ? ? ?For any non-urgent questions, you may also contact your provider using MyChart. We now offer e-Visits for anyone 18 and older to request care online for non-urgent symptoms. For details visit [email or web address]. ?  ?Also download the MyChart app! Go to the app store, search "MyChart", open the app, select [facility], and log in with your MyChart username and password. ? ?Due to Covid, a mask is required upon entering the hospital/clinic. If you do not have a mask, one will be given to you upon arrival. For doctor visits, patients may have 1 support person aged 18 or older with them. For treatment visits, patients cannot have anyone with them due to current Covid guidelines and our immunocompromised population.  ? ?

## 2021-01-24 NOTE — Progress Notes (Addendum)
[place]   [phone] [phone]   Clinic Follow up Note   Patient Care Team: [name], Debra Lesches, [name] (Inactive) as PCP - General (Physician Assistant) Debra Killings, MD as Consulting Physician (Orthopedic Surgery) Pickenpack-Cousar, Debra Sax, NP as Nurse Practitioner (Nurse Practitioner)  Date of Service:  01/24/2021  CHIEF COMPLAINT: f/u of metastatic breast cancer  CURRENT THERAPY:  Trastuzumab D1, and Vinorelbine D1 and 8 every 21 days, starting 11/01/20  ASSESSMENT & PLAN:  [name] is a 56 y.o. female with   1. 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 She had right mastectomy, 4 cycles of AC-T and 5 years of Tamoxifen/AI. -In 06/2015 she had breast cancer recurrence metastatic to the bone.  -she has had multiple endocrine therapy and chemotherapy, overall does not tolerate treatment well and frequently required dose reduction -We switched her treatment to CMF and Keytruda on 08/02/20, and continued herceptin. Due to poor tolerance, I dropped the methotrexate and fluorouracil and delayed her third cycle to 09/20/20, so she received cytoxan, Beryle Flock, and herceptin. She tolerated much better with just some fatigue and mild headaches. -restaging PET 10/31/20 unfortunately showed significantly worsening disease. -she was switched to trastuzumab and vinorelbine on 11/01/20. She is tolerating moderately well with nausea and fatigue, dose reduced after cycle 1  -she continues to have low quality of life with pain and fatigue, her PS has dropped lately  -- I ordered PET scan last week, to be done before next visit in 2 weeks  -Labs reviewed, overall stable. We will continue chemo for now. She has agreed with GCSF, will give Onpro today    2. Symptom Management: nausea and vomiting with weight loss, depression with fatigue -with her increased  bone pains, she has had more nausea and vomiting. Because of this, she has lost weight.  -she did not tolerate Boost or Ensure. We previously reviewed other options. I also prescribed Megace for her on 12/31/20 -she reports the compazine is helpful for her nausea. -she reports she feels depressed and is spending most of the day in bed. She declines social work/counselor at this time and declines medication.   3. Body pain, secondary to bone mets   -She is being treated with Zometa q36month. -She will continue oral calcium and Vit D and Prilosec for Acid Reflux.  -She has received palliative radiation therapy to the left femur (2017) and the right sacroiliac area (2019) under Dr. KSondra [name] -her hip and leg pain has increased recently. Restaging PET 10/31/20 showed worsening of the disease in the bones. She received another course of palliative radiation to the right femur and left hip 11/28-12/14/22. -she currently has MS contin and dilaudid for pain, which she does not feel is helping.  -I will refill her dilaudid today    4. Goal of care discussion, DNR/DNI -We previously reviewed goal of care and I recommend DNR/DNI, she agreed again 07/19/20. -I again encouraged her to start setting up her living will. Her husband [name] automatically her HCPOA. She is considering changing her HCPOA to her daughter [name] whom she lives with.  -Though her care is palliative, our goal is to prolong her life. She would like to continue treatment. -she declines home palliative care as she does not want a stranger to [name] into her home.   5. Congenital deafness   6. Mild anemia, secondary to chemotherapy and  malignancy  -her hgb decreased to 7.8 on 09/13/20.  -improved off methotrexate and fluorouraci.  -her hgb has gone back down from the trastuzumab/vinorelbine. Hgb 7.9 today, will give one unit blood    7. 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 Mild, only in her feet. -worsened with CMF, remains mild off  treatment.     PLAN:   -proceed with C3D8 vinorelbine today with Onpro -1 unit pRBC today also -I refilled Dilaudid today  -f/u and C4 in 2 weeks, with lab and PET several days before   No problem-specific Assessment & Plan notes found for this encounter.   SUMMARY OF ONCOLOGIC HISTORY: Oncology History Overview Note   Cancer Staging No matching staging information was found for the patient.     [name], right ([place])  11/1997 Cancer Diagnosis   Patient had 1.4 cm poorly differentiated right breast carcinoma diagnosed in Nov 1999 at age 83, 1/7 axillary nodes involved, ER/PR and HER 2 positive. She had mastectomy with the 7 axillary node evaluation, 4 cycles of adriamycin/ cytoxan followed by taxotere, then five years of tamoxifen thru June 2005 (no herceptin in 1999). She had bilateral oophorectomy in May 2005. She was briefly on aromatase inhibitor after tamoxifen, but discontinued this herself due to poor tolerance.   04/04/2015 Genetic Testing   Negative genetic testing on the breast/ovarian cancer pnael.  The Breast/Ovarian gene panel offered by [facility] includes sequencing and rearrangement analysis for the following 20 genes:  ATM, BARD1, BRCA1, BRCA2, BRIP1, CDH1, CHEK2, EPCAM, FANCC, MLH1, MSH2, MSH6, NBN, PALB2, PMS2, PTEN, RAD51C, RAD51D, TP53, and XRCC2.   06/26/2015 Pathology Results   Initial Path Report Bone, curettage, Left femur met, intramedullary subtroch tissue - METASTATIC ADENOCARCINOMA.  Estrogen Receptor: 95%, POSITIVE, STRONG STAINING INTENSITY Progesterone Receptor: 2%, POSITIVE, STRONG  HER2 (+), IHC 3+   06/26/2015 Surgery   Biopsy of metastatic tissue and stabilization with Affixus trochanteric nail, proximal and distal interlock of left femur by Dr. [name]    06/28/2015 Imaging   CT Chest w/ Contrast 1. Extensive right internal mammary chain lymphadenopathy consistent with metastatic breast cancer. 2. Lytic metastases  involving the posterior elements at T1 with probable nondisplaced pathologic fracture of the spinous process. 3. No other evidence of thoracic metastatic disease. 4. Trace bilateral pleural effusions with associated bibasilar atelectasis. 5. Indeterminate right thyroid nodule, not previously imaged. This could be evaluated with thyroid ultrasound as clinically warranted.   06/29/2015 -  Chemotherapy   Zometa started monthly on 06/29/15 and switched to q84month from 01/08/17   07/01/2015 Imaging   Bone Scan 1. Increased activity noted throughout the left femur. Although metastatic disease cannot be excluded, these changes are most likely secondary to prior surgery. 2. No other focal abnormalities identified to suggest metastatic disease.   07/12/2015 - 07/26/2015 Radiation Therapy   Left femur, 30 Gy in 10 fractions by Dr. KSondra [name]   07/14/2015 Initial Diagnosis   [name], right ([place]   07/19/2015 Imaging   Initial PET Scan 1. Severe multifocal osseous metastatic disease, much greater than anticipated based on prior imaging. 2. Multifocal nodal metastases involving the right internal mammary, prevascular and subpectoral lymph nodes. No axillary pulmonary involvement identified. 3. No distant extra osseous metastases.   07/30/2015 - 11/29/2015 Chemotherapy   Docetaxel, Herceptin and Perjeta every 3 weeks X6 cycles, pt tolerated moderately well, restaging scan showed excellent response    09/18/2015 Imaging   CT Head w/wo Contrast 1.  No acute intracranial abnormality or significant interval change. 2. Stable minimal periventricular white matter hypoattenuation on the right. This may reflect a remote ischemic injury. 3. No evidence for metastatic disease to the brain. 4. No focal soft tissue lesion to explain the patient's tenderness.   10/30/2015 Imaging   CT Abdomen Pelvis w/ Contrast 1. Few mildly prominent fluid-filled loops of small bowel scattered throughout  the abdomen, with intraluminal fluid density within the distal colon. Given the provided history, findings are suggestive of acute enteritis/diarrheal illness. 2. No other acute intra-abdominal or pelvic process identified. 3. Widespread osseous metastatic disease, better evaluated on most recent PET-CT from 07/19/2015. No pathologic fracture or other complication. 4. 11 mm hypodensity within the left kidney. This lesion measures intermediate density, and is indeterminate. While this lesion is similar in size relative to recent studies, this is increased in size relative to prior study from 2012. Further evaluation with dedicated renal mass protocol CT and/or MRI is recommended for complete characterization.   12/2015 - 05/14/2016 Chemotherapy   Maintenance Herceptin and pejeta every 3 weeks stopped on 05/14/16 due to disease progression   12/25/2015 Imaging   Restaging PET Scan 1. Markedly improved skeletal activity, with only several faint foci of residual accentuated metabolic activity, but resolution of the vast majority of the previously extensive osseous metastatic disease. 2. Resolution of the prior right internal mammary and right prevascular lymph nodes. 3. Residual subcutaneous edema and edema along fascia planes in the left upper thigh. This remains somewhat more than I would expect for placement of an IM nail 6 weeks ago. If there is leg swelling further down, consider Doppler venous ultrasound to rule out left lower extremity DVT. I do not perceive an obvious difference in density between the pelvic and common femoral veins on the noncontrast CT data. 4. Chronic right maxillary and right sphenoid sinusitis.   01/16/2016 - 10/20/2017 Anti-estrogen oral therapy   Letrozole 2.5 mg daily. She was switched to exemestane due to disease progression on 02/08/17. Stopped on 10/20/2017 when treatment changed to chemo    03/01/2016 Miscellaneous   Patient presented to ED following a fall; she reports she  landed on her back and is now experiencing back pain. The patient was evaluated and discharge home same day with pain control.   05/01/2016 Imaging   CT CAP IMPRESSION: 1. Stable exam.  No new or progressive disease identified. 2. No evidence for residual or recurrent adenopathy within the chest. 3. Stable bone metastasis.   05/01/2016 Imaging   BONE SCAN IMPRESSION: 1. Small focal uptake involving the anterior rib ends of the left second and right fourth ribs, new since the prior bone scan. The location of this uptake is more suggestive of a traumatic or inflammatory etiology as opposed to metastatic disease. No other evidence of metastatic disease. 2. There are other areas of stable uptake which are most likely degenerative/reactive, including right shoulder and cervical spine uptake and uptake along the right femur adjacent to the ORIF hardware.   05/20/2016 Imaging   NM PET Skull to Thigh IMPRESSION: 1. There multiple metastatic foci in the bony pelvis, including some new abnormal foci compare to the prior PET-CT, compatible with mildly progressive bony metastatic disease. Also, a left T11 vertebral hypermetabolic metastatic lesion is observed, new compared to the prior exam. 2. No active extraosseous malignancy is currently identified. 3. Right anterior fourth rib healing fracture, appears be   06/04/2016 - 01/08/2017 Chemotherapy   Second line chemotherapy Kadcyla every 3  weeks started on 06/04/16 and stopped on 01/08/17 due to mixed response.     10/05/2016 Imaging   CT CAP and Bone Scan:  Bones: left intramedullary rod and left femoral head neck screw in place. In the proximal diaphysis of the left femur there is cortical thickening and irregularity. No lytic or blastic bone lesions. No fracture or vertebral endplate destruction.   No definite evidence of recurrent disease in the chest, abdomen, or pelvis.   10/28/2016 Imaging   CT A/P IMPRESSION: No acute process  demonstrated in the abdomen or pelvis. No evidence of bowel obstruction or inflammation.   11/04/2016 Imaging   DG foot complete left: IMPRESSION: No fracture or dislocation.  No soft tissue abnormality   11/23/2016 Imaging   CT RIGHT FEMUR IMPRESSION: Negative CT scan of the right thigh.  No visible metastatic disease.  CT LEFT FEMUR IMPRESSION: 1. Postsurgical changes related to prior cephalomedullary rod fixation of the left femur with linear lucency along the anterolateral proximal femoral cortex at level of the lesser trochanter, suspicious for nondisplaced fracture. 2. Slightly permeative appearance of the proximal left femoral cortex at the site of known prior osseous metastases is largely unchanged. 3. Unchanged patchy lucency and sclerosis along the anterior acetabulum, which appears to correspond with an area of faint uptake on the prior PET-CT, suspicious for osseous metastasis. These results will be called to the ordering clinician or representative by the Radiologist Assistant, and communication documented in the PACS or zVision Dashboard.       01/27/2017 Imaging   IMPRESSION: 1. Mixed response to osseous metastasis. Some hypermetabolic lesions are new and increasingly hypermetabolic, while another index lesion is decreasingly hypermetabolic. 2. No evidence of hypermetabolic soft tissue metastasis.     02/08/2017 - 10/19/2017 Antibody Plan   -Herceptin every 3 week since 02/08/17 -Oral Lanpantinib 1582m and decreased to 504mdue to diarrhea and depression. Due to disease progression she swithced to Verzenio 15050mn 05/04/17. She did not toelrate well so she was switched to Ibrance 100m74m 05/31/17. Due to her neutropenia, Ibrance dose reduced to 75 mg daily, 3 weeks on, one-week off, stopped on 10/20/2017 due to disease progression.    04/30/2017 PET scan   IMPRESSION: 1. Primarily increase in hypermetabolism of osseous metastasis. An isolated right acetabular  lesion is decreased in hypermetabolism since the prior exam. 2. No evidence of hypermetabolic soft tissue metastasis. 3. Similar non FDG avid right-sided thyroid nodule, favoring a benign etiology. Recommend attention on follow-up.   08/30/2017 PET scan   08/30/2017 PET Scan  IMPRESSION: 1. Worsening osseous metastatic disease. 2. Focal hypermetabolism in the central spinal canal the L1 level, similar to the prior exam. Metastatic implant cannot be excluded. 3. Slight enlargement of a right thyroid nodule which now appears more cystic in character. Consider further evaluation with thyroid ultrasound. If patient is clinically hyperthyroid, consider nuclear medicine thyroid uptake and scan   08/30/2017 Progression   08/30/2017 PET Scan  IMPRESSION: 1. Worsening osseous metastatic disease. 2. Focal hypermetabolism in the central spinal canal the L1 level, similar to the prior exam. Metastatic implant cannot be excluded. 3. Slight enlargement of a right thyroid nodule which now appears more cystic in character. Consider further evaluation with thyroid ultrasound. If patient is clinically hyperthyroid, consider nuclear medicine thyroid uptake and scan   09/29/2017 - 10/11/2017 Radiation Therapy   Pt received 27 Gy in 9 fractions directed at the areas of significant uptake noted on recent bone scan the right pelvis  and right proximal femur, with Dr, Sondra [name]    10/20/2017 - 04/07/2018 Chemotherapy   -Herceptin every 3 weeks starting 02/08/17, perjeta added on 10/20/2017  -weekly Taxol started on 10/21/2017. Will reduce Taxol to 2 weeks on, 1 week off starting on 02/04/17.  Stoped due to mild disease progression   10/27/2017 Imaging   10/27/2017 CT AP IMPRESSION: 1. No acute abnormality. No evidence of bowel obstruction or acute bowel inflammation. Normal appendix. 2. Stable patchy sclerotic osseous metastases throughout the visualized skeleton. No new or progressive metastatic disease in  the abdomen or pelvis.   01/11/2018 Imaging   Whole Body Bone Scan 01/11/18  IMPRESSION: 1. Multifocal osseous metastases as described. 2. Right scapular metastasis. 3. Right superior thoracic spinous process. 4. Right sacral ala and right L5 metastases. 5. Right acetabular metastasis. 6. Right proximal femur metastasis. 7. Diffuse metastases throughout the left femur. 8. Anterior right fourth rib metastasis.   01/11/2018 Imaging   CT CAP W Contrast 01/11/18  IMPRESSION: 1. Similar-appearing osseous metastatic disease. 2. No evidence for progressed metastatic disease in the chest, abdomen or pelvis.   03/30/2018 Imaging   Bone Scan 03/30/18  IMPRESSION: Stable multifocal bony metastatic disease.   03/30/2018 Imaging    CT CAP W Contrast 03/30/18 IMPRESSION: 1. New mild contiguous wall thickening with associated mucosal hyperenhancement throughout the left colon and rectum, suggesting nonspecific infectious or inflammatory proctocolitis. Differential includes C diff colitis. 2. Stable faint patchy sclerosis in the iliac bones and lumbar vertebral bodies. No new or progressive osseous metastatic disease by CT. Please note that the osseous lesions are relatively occult by CT and would be better evaluated by PET-CT, as clinically warranted. 3. No evidence of extra osseous metastatic disease.   These results will be called to the ordering clinician or representative by the Radiologist Assistant, and communication documented in the PACS or zVision Dashboard.   04/15/2018 - 06/16/2018 Chemotherapy   Enhertu q3weeks starting 04/15/18. Stopped Enhertu after cycle 3 on 05/27/18 due to poor toleration due to poor toleration with anorexia, weight loss, N&V, diarrhea   04/21/2018 PET scan   PET 04/21/18  IMPRESSION: 1. Overall marked interval improvement in bony hypermetabolic disease. All of the previously identified hypermetabolic osseous metastases have decreased in hypermetabolism in the  interval. There is a new tiny focus of hypermetabolism in the right aspect of the L3 vertebral body that appears to correspond to a new 8 mm lucency, raising concern for new metastatic disease. Close attention on follow-up recommended. 2. Focal hypermetabolism in the central spinal canal at the level of L1 previously has resolved. 3. No hypermetabolic soft tissue disease on today's study. 4. Right thyroid nodule seen previously has decreased in the interval.     06/16/2018 Imaging   CT LUMBAR SPINE WO CONTRAST 06/16/18 IMPRESSION: 1. Transitional lumbosacral anatomy. Correlation with radiographs is recommended prior to any operative intervention. 2. Suspicion of a right L3 vertebral body metastasis on the April PET-CT is not evident by CT. Stable degenerative versus metastatic lesion at the right L5 superior endplate. Small but increased right iliac bone metastasis since March. Lumbar MRI would best characterize lumbar metastatic disease (without contrast if necessary). 3. Suspect L2-L3 left subarticular segment disc herniation. Query left L2 radiculitis. 4. Stable appearance of rightward L4-L5 disc degeneration which could affect the right foramen and right lateral recess.   06/17/2018 - 11/16/2018 Chemotherapy   Restart Taxol 2 weeks on/1 week off with Herceptin and Perjeta q3weeks on 06/17/18. Perjeta was stopped after  C5 due to diarrhea. Stopped Taxol after 11/16/18 due to disease progression to L3.    07/06/2018 PET scan   PET  IMPRESSION: 1. Near total resolution of hypermetabolic activity associated with skeletal metastasis. Very mild activity associated with the LEFT iliac bone which is similar to background blood pool activity. 2. No new or progressive disease.   11/21/2018 PET scan   IMPRESSION: 1. New focal hypermetabolic lesion eccentric to the right in the L3 vertebral body, maximum SUV 6.1 compatible with a new focus of active malignancy. 2. Very low-grade activity  similar to blood pool in previous existing mildly sclerotic metastatic lesions including the left iliac bone lesion. Some of the mildly sclerotic lesions are not hypermetabolic compatible with effectively treated bony metastatic disease. 3. No hypermetabolic soft tissue lesions are identified.     11/30/2018 - 08/25/2019 Chemotherapy   Continue Herceptin q3weeks. Stopped 08/25/19 due to significant disease progression and switched to  IV Matuzumab   12/12/2018 - 12/17/2018 Chemotherapy   Tucatinib (TUKYSA) 6 tabs BID as a next line therapy starting 12/12/18. Stopped 12/17/18 duet o N&V, poor appetite and HA    12/21/2018 - 03/17/2019 Chemotherapy   IV Eribulin on day 1, 8 every 21 days starting 12/21/18. Stopped 03/17/19 due to mixed response to treatment.    03/21/2019 PET scan   IMPRESSION: 1. Mixed response to therapy of osseous metastasis. Although a right-sided L4 lesion demonstrates decreased hypermetabolism today, new left iliac and right ischial hypermetabolic lesions are seen. 2. No evidence of hypermetabolic soft tissue metastasis.     03/27/2019 - 07/21/2019 Chemotherapy   Neratinib (Nerlynx) titrate up from 180m to 2438mstarting 03/27/19. She could not tolerate 16080mnd stopped on 02/05/19 due to N&V and diarrhea. Will restart at 120m90m 04/10/18-04/27/19 and 4/19-4/22. Reduced to 80mg49me daily on 05/10/19. Held/stopped since 07/21/19 due to dry cough, Nausea and body aches.   06/23/2019 Imaging   CT Head  IMPRESSION: 1. No acute intracranial abnormality or metastatic disease identified. 2. Chronic white matter disease in the right hemisphere is stable from prior exams.     09/05/2019 PET scan   IMPRESSION: 1. Significant progression of widespread hypermetabolic osseous metastases throughout the axial skeleton as detailed. 2. No hypermetabolic extraosseous sites of metastatic disease.   09/15/2019 - 12/22/2019 Chemotherapy   -IV Margetuximab q3weeks and IV Gemcitabine 2 weeks  on/1 week off starting 09/15/19. Carboplatin added to Gemcitabine starting with C3 on 11/03/19. D/c after C5 due to disease progression in bone.    12/13/2019 Imaging   PET  IMPRESSION: 1. Interval progression of widespread, FDG avid osseous metastasis involving the axial skeleton. 2. No FDG avid extra osseous sites of metastatic disease.     01/19/2020 -  Chemotherapy   -Xeloda BID 2 weeks on/1 week off and Herceptin q3weeks starting 01/19/20. C1 at 1000mg 18m C2 increased to 1500mg i47me AM and 1000mg in70m PM starting 02/12/20.   04/09/2020 Imaging   CT CAP  IMPRESSION: 1. New 3 mm RIGHT upper lobe nodule, nonspecific, attention on follow-up. 2. What may represent an incomplete periprosthetic fracture about the cephalo medullary rod in the LEFT proximal femur. This linear band of incomplete bony fusion that is more pronounced than on previous CT imaging that was acquired in 2017 and may represent a stress type fracture about hardware. Correlate with any pain in this area. Sclerotic margins show chronic features but the linear band passing through the bone around the intramedullary rod is more extensive,  involving approximately 270 degrees of the circumference of the femur, medial cortex remains intact. 3. Other findings of bony metastatic disease are similar to prior imaging. Sclerosis and some periosteal reaction about a nondisplaced pathologic fracture of the LEFT lateral seventh rib. 4. Small hypervascular focus in the RIGHT hepatic lobe, indistinct but new compared to previous imaging. Perhaps this represents a transient phenomenon though is not imaged on the later phase to allow for confirmation. Liver MRI may be helpful for further evaluation, to exclude underlying metastatic lesion as clinically warranted. 5. Hepatic steatosis. 6. Aortic atherosclerosis.   Aortic Atherosclerosis (ICD10-I70.0).     07/18/2020 Imaging   PET  IMPRESSION: No signs of visceral or nodal  metastatic disease.   Increased number of hypermetabolic lesions with increased intensity of many of these areas and increased "size" of affected areas based on the regions of FDG uptake. While there are areas that do show diminished FDG uptake such as in the RIGHT scapula on balance the number and size of these areas has increased.   08/02/2020 - 10/11/2020 Chemotherapy   Patient is on Treatment Plan : BREAST Adjuvant CMF IV q21d     10/31/2020 PET scan   IMPRESSION: Interval worsening of disease with new areas of increased metabolic activity associated with LEFT hilar and infrahilar lymph nodes and on balance worsening of hypermetabolic areas within the spine, pelvis and appendicular skeleton as discussed.   Of particular note is the marked interval worsening of disease in the RIGHT proximal femur, no current visible lesion other than subtle sclerosis is noted in this area, attention to this and other substantial load bearing areas on subsequent imaging.   No additional sites of new disease or visceral involvement.   11/01/2020 -  Chemotherapy   Patient is on Treatment Plan : BREAST METASTATIC Trastuzumab D1 + Vinorelbine D1,8 (25) q21d        INTERVAL HISTORY:  [name] is here for a follow up of metastatic breast cancer. She was last seen by me on 01/17/21. She was seen in the infusion area per her nurse's request. She remains to be quite fatigued, and sleeps a lot during the day.  Takes Dilaudid twice a day for pain, pain is overall controlled. Appetite is low, she is able to do her ADLs but not much other activities.    All other systems were reviewed with the patient and are negative.  MEDICAL HISTORY:  Past Medical History:  Diagnosis Date   Abnormal Pap smear    Anxiety    Breast cancer ([place])    Cancer ([place]) 1999   breast-s/p mastectomy, chemo, rad   CIN I (cervical intraepithelial neoplasia I) 2003   by colpo   Congenital deafness    Deaf    Depression     History of radiation therapy    Right femur, Left hip- 12/09/20-12/25/20- Dr. Gery Pray   History of radiation therapy    right pelvis/right femur 09/28/2017-10/11/2017  Dr Gery Pray   Port-A-Cath in place 2017   for chemotherapy   Radiation 07/12/15-07/26/15   left femur 30 Gy   S/P bilateral oophorectomy     SURGICAL HISTORY: Past Surgical History:  Procedure Laterality Date   ABDOMINAL HYSTERECTOMY     INTRAMEDULLARY (IM) NAIL INTERTROCHANTERIC Left 06/26/2015   Procedure: LEFT BIOMET LONG AFFIXS NAIL;  Surgeon: Debra Killings, MD;  Location: [facility];  Service: Orthopedics;  Laterality: Left;   IR GENERIC HISTORICAL  08/06/2015   IR US GUIDE  VASC ACCESS LEFT 08/06/2015 Aletta Edouard, MD [facility]   IR GENERIC HISTORICAL  08/06/2015   IR FLUORO GUIDE CV LINE LEFT 08/06/2015 Aletta Edouard, MD [facility]   IR GENERIC HISTORICAL  03/05/2016   IR CV LINE INJECTION 03/05/2016 Markus Daft, MD [facility]   LEFT HEART CATH AND CORONARY ANGIOGRAPHY N/A 12/13/2017   Procedure: LEFT HEART CATH AND CORONARY ANGIOGRAPHY;  Surgeon: Leonie Man, MD;  Location: [facility];  Service: Cardiovascular;  Laterality: N/A;   MASTECTOMY     right breast   MASTECTOMY     OVARIAN CYST REMOVAL     RADIOLOGY WITH ANESTHESIA Left 06/25/2015   Procedure: MRI OF LEFT HIP WITH OR WITHOUT CONTRAST;  Surgeon: Medication Radiologist, MD;  Location: [facility];  Service: Radiology;  Laterality: Left;  DR. MCINTYRE/MRI   TUBAL LIGATION      I have reviewed the social history and family history with the patient and they are unchanged from previous note.  ALLERGIES:  is allergic to promethazine hcl, chlorhexidine, and diphenhydramine hcl.  MEDICATIONS:  Current Outpatient Medications  Medication Sig Dispense Refill   dexamethasone (DECADRON) 4 MG tablet Take 1 tablet (4 mg total) by mouth daily. For 3-5 days after chemo (Patient not taking: No sig reported) 30 tablet 0   HYDROmorphone (DILAUDID) 2 MG  tablet Take 1 tablet (2 mg total) by mouth every 6 (six) hours as needed for severe pain. 45 tablet 0   lidocaine (LIDODERM) 5 % Place 1 patch onto the skin daily. Remove & Discard patch within 12 hours or as directed by MD 30 patch 0   lidocaine-prilocaine (EMLA) cream Apply 1 application topically as needed. Apply to Porta-Cath 1-2 hours prior to access as directed. 90 g 2   LORazepam (ATIVAN) 0.5 MG tablet Take 1 tablet (0.5 mg total) by mouth every 8 (eight) hours as needed for anxiety (nausea). (Patient not taking: Reported on 11/20/2020) 20 tablet 0   megestrol (MEGACE) 400 MG/10ML suspension Take 10 mLs (400 mg total) by mouth daily. 240 mL 0   morphine (MS CONTIN) 15 MG 12 hr tablet Take 1 tablet (15 mg total) by mouth every 12 (twelve) hours. 30 tablet 0   potassium chloride (KLOR-CON) 10 MEQ tablet Take 1 tablet (10 mEq total) by mouth 3 (three) times daily. 90 tablet 2   prochlorperazine (COMPAZINE) 10 MG tablet TAKE 1 TABLET(10 MG) BY MOUTH EVERY 6 HOURS AS NEEDED FOR NAUSEA OR VOMITING 60 tablet 1   zolpidem (AMBIEN) 10 MG tablet Take 1 tablet (10 mg total) by mouth at bedtime as needed for sleep. 30 tablet 0   No current facility-administered medications for this visit.   Facility-Administered Medications Ordered in Other Visits  Medication Dose Route Frequency Provider Last Rate Last Admin   heparin lock flush 100 unit/mL  500 Units Intracatheter Once PRN Truitt Merle, MD       pegfilgrastim (NEULASTA ONPRO KIT) injection 6 mg  6 mg Subcutaneous Once Truitt Merle, MD       potassium chloride SA (K-DUR) CR tablet 40 mEq  40 mEq Oral Once Truitt Merle, MD       sodium chloride 0.9 % 1,000 mL with potassium chloride 10 mEq infusion   Intravenous Continuous Livesay, Lennis P, MD   Stopped at 09/10/15 1653   sodium chloride 0.9 % injection 10 mL  10 mL Intravenous PRN Livesay, Lennis P, MD       sodium chloride flush (NS) 0.9 %  injection 10 mL  10 mL Intracatheter PRN Truitt Merle, MD        vinorelbine (NAVELBINE) 30 mg in sodium chloride 0.9 % 50 mL chemo infusion  18 mg/m2 (Treatment Plan Recorded) Intravenous Once Truitt Merle, MD 318 mL/hr at 01/24/21 1043 30 mg at 01/24/21 1043    PHYSICAL EXAMINATION: ECOG PERFORMANCE STATUS: 2 - Symptomatic, <50% confined to bed  There were no vitals filed for this visit. Wt Readings from Last 3 Encounters:  01/24/21 123 lb 12 oz (56.1 kg)  12/27/20 127 lb 8 oz (57.8 kg)  12/12/20 126 lb 14.4 oz (57.6 kg)     GENERAL:alert, no distress and comfortable SKIN: skin color normal, no rashes or significant lesions EYES: normal, Conjunctiva are pink and non-injected, sclera clear  NEURO: alert & oriented x 3 with fluent speech  LABORATORY DATA:  I have reviewed the data as listed CBC Latest Ref Rng & Units 01/24/2021 01/17/2021 12/27/2020  WBC 4.0 - 10.5 K/uL 2.1(L) 3.2(L) 3.4(L)  Hemoglobin 12.0 - 15.0 g/dL 7.9(L) 8.9(L) 8.9(L)  Hematocrit 36.0 - 46.0 % 23.8(L) 27.7(L) 26.9(L)  Platelets 150 - 400 K/uL 165 160 155     CMP Latest Ref Rng & Units 01/24/2021 01/17/2021 12/27/2020  Glucose 70 - 99 mg/dL 95 99 96  BUN 6 - 20 mg/dL 6 8 6   Creatinine 0.44 - 1.00 mg/dL 0.93 0.93 0.80  Sodium 135 - 145 mmol/L 140 140 142  Potassium 3.5 - 5.1 mmol/L 3.2(L) 3.7 2.8(L)  Chloride 98 - 111 mmol/L 103 104 106  CO2 22 - 32 mmol/L 27 28 24   Calcium 8.9 - 10.3 mg/dL 9.4 9.6 8.8(L)  Total Protein 6.5 - 8.1 g/dL 6.7 7.0 6.4(L)  Total Bilirubin 0.3 - 1.2 mg/dL 1.3(H) 1.9(H) 1.4(H)  Alkaline Phos 38 - 126 U/L 69 76 70  AST 15 - 41 U/L 21 20 21   ALT 0 - 44 U/L 14 10 12       RADIOGRAPHIC STUDIES: I have personally reviewed the radiological images as listed and agreed with the findings in the report. No results found.    No orders of the defined types were placed in this encounter.  All questions were answered. The patient knows to call the clinic with any problems, questions or concerns. No barriers to learning was detected. The total time spent  in the appointment was 30 minutes.     Truitt Merle, MD 01/24/2021   I, Wilburn Mylar, am acting as scribe for Truitt Merle, MD.   I have reviewed the above documentation for accuracy and completeness, and I agree with the above.

## 2021-01-24 NOTE — Progress Notes (Signed)
[facility]  Telephone:([phone] Fax:(336) [phone]   Name: [name] Date: 01/24/2021 MRN: [id number]  DOB: 1965-02-11  Patient Care Team: [name], Debra Lesches, [name] (Inactive) as PCP - General (Physician Assistant) Debra Killings, MD as Consulting Physician (Orthopedic Surgery) [name], Debra Sax, NP as Nurse Practitioner (Nurse Practitioner)    INTERVAL HISTORY: [name] is a 56 y.o. female with metastatic right breast (initial diagnosis 11/1997) s/p right mastectomy, chemotherapy, currently receiving vinorelbine, GERD, vertigo, congenital deafness, chemo-induced neuropathy, osteopenia, depression, anxiety, and cancer-related pain.  Palliative ask to see for symptom management and goals of care.   SOCIAL HISTORY:     reports that she has never smoked. She has never used smokeless tobacco. She reports that she does not drink alcohol and does not use drugs.  ADVANCE DIRECTIVES:  Patient does not have a completed advanced directive. She understands her husband, [name] is considered HCPOA in the absence of such documents. Is aware of advanced directives clinic if she would like for [place] to make an appointment.    CODE STATUS: DNR  PAST MEDICAL HISTORY: Past Medical History:  Diagnosis Date   Abnormal Pap smear    Anxiety    Breast cancer ([place])    Cancer ([place]) 1999   breast-s/p mastectomy, chemo, rad   CIN I (cervical intraepithelial neoplasia I) 2003   by colpo   Congenital deafness    Deaf    Depression    History of radiation therapy    Right femur, Left hip- 12/09/20-12/25/20- Dr. Gery [name]   History of radiation therapy    right pelvis/right femur 09/28/2017-10/11/2017  Dr [name]   Port-A-Cath in place 2017   for chemotherapy   Radiation 07/12/15-07/26/15   left femur 30 Gy   S/P bilateral oophorectomy     ALLERGIES:  is allergic to promethazine hcl, chlorhexidine, and diphenhydramine  hcl.  MEDICATIONS:  Current Outpatient Medications  Medication Sig Dispense Refill   dexamethasone (DECADRON) 4 MG tablet Take 1 tablet (4 mg total) by mouth daily. For 3-5 days after chemo (Patient not taking: No sig reported) 30 tablet 0   HYDROmorphone (DILAUDID) 2 MG tablet Take 1 tablet (2 mg total) by mouth every 6 (six) hours as needed for severe pain. 45 tablet 0   lidocaine-prilocaine (EMLA) cream Apply 1 application topically as needed. Apply to Porta-Cath 1-2 hours prior to access as directed. 90 g 2   LORazepam (ATIVAN) 0.5 MG tablet Take 1 tablet (0.5 mg total) by mouth every 8 (eight) hours as needed for anxiety (nausea). (Patient not taking: Reported on 11/20/2020) 20 tablet 0   megestrol (MEGACE) 400 MG/10ML suspension Take 10 mLs (400 mg total) by mouth daily. 240 mL 0   morphine (MS CONTIN) 15 MG 12 hr tablet Take 1 tablet (15 mg total) by mouth every 12 (twelve) hours. 30 tablet 0   potassium chloride (KLOR-CON) 10 MEQ tablet Take 1 tablet (10 mEq total) by mouth 3 (three) times daily. 90 tablet 2   prochlorperazine (COMPAZINE) 10 MG tablet TAKE 1 TABLET(10 MG) BY MOUTH EVERY 6 HOURS AS NEEDED FOR NAUSEA OR VOMITING 60 tablet 1   zolpidem (AMBIEN) 10 MG tablet Take 1 tablet (10 mg total) by mouth at bedtime as needed for sleep. 30 tablet 0   No current facility-administered medications for this visit.   Facility-Administered Medications Ordered in Other Visits  Medication Dose Route Frequency Provider Last Rate Last Admin  heparin lock flush 100 unit/mL  500 Units Intracatheter Once PRN Debra Merle, MD       pegfilgrastim (NEULASTA ONPRO KIT) injection 6 mg  6 mg Subcutaneous Once Debra Merle, MD       potassium chloride SA (K-DUR) CR tablet 40 mEq  40 mEq Oral Once Debra Merle, MD       sodium chloride 0.9 % 1,000 mL with potassium chloride 10 mEq infusion   Intravenous Continuous [name], Debra Julian, MD   Stopped at 09/10/15 1653   sodium chloride 0.9 % injection 10 mL  10 mL  Intravenous PRN [name], Debra P, MD       sodium chloride flush (NS) 0.9 % injection 10 mL  10 mL Intracatheter PRN Debra Merle, MD       vinorelbine (NAVELBINE) 30 mg in sodium chloride 0.9 % 50 mL chemo infusion  18 mg/m2 (Treatment Plan Recorded) Intravenous Once Debra Merle, MD       Zoledronic Acid (ZOMETA) IVPB 4 mg  4 mg Intravenous Once Debra Merle, MD 400 mL/hr at 01/24/21 0959 4 mg at 01/24/21 0959    VITAL SIGNS: There were no vitals taken for this visit. There were no vitals filed for this visit.  Estimated body mass index is 19.97 kg/m as calculated from the following:   Height as of 12/12/20: 5' 6"  (1.676 m).   Weight as of an earlier encounter on 01/24/21: 123 lb 12 oz (56.1 kg).   PERFORMANCE STATUS (ECOG) : 2 - Symptomatic, <50% confined to bed   Physical Exam General: NAD,weak appearing  Cardiovascular: regular rate and rhythm Pulmonary: clear ant fields Abdomen: soft, tender, + bowel sounds Extremities: no edema, no joint deformities Neurological: Weakness, AAOx3, fluent with sign-language interpretor   IMPRESSION:  I saw Mrs. [name] during her infusion today. She appears comfortable, resting in bed. No family present, however Interpretor is at the bedside.  We discussed messages sent earlier this week with concerns of ongoing nausea and vomiting. She reports noticing some blood in emesis. Per today's discussion she has not seen any more blood and it was a minimal amount. Recommended contacting office in the future and seeking emergency assistance if experiencing large amounts of blood via cough or vomiting.   She is inquiring about seeing Dr. Sondra [name] due to a missed appointment on yesterday. Debra Soho, RN has reached out and patient aware that she will need to rescheduled this appointment due to he is not available on Fridays. She really wants appointments on the same day due to transportation concerns however most of her appointments are on Fridays when he is not  working.   Pain Ongoing discussions regarding pain regimen. She is taking MS Contin ER at night and Dilaudid po for breakthrough. She tried taking MS Contin ER as prescribed twice daily however states after 2 days she stopped due to drowsiness during the day. She endorses sleeping on and off despite use of medication but feels it is worst when she does. She is also not taking the hydromorphone during the day with fear of drowsiness. She rates her pain as a constant 8-9/10. Education provided on use of medication for pain to allow her some relief. Recommended if she is planning to take a nap and is having pain this may be appropriate timing to take. She verbalized understanding.   Fatigue Continues to endorse fatigue. States she sleeps on and off during the day. Is able to do some activity around the home and play with her  granddaughter but with frequent rest breaks. Kynesha fell last week after her granddaughter caught her off guard and ran and jumped up on her. Denies any injuries.   Poor appetite/weight loss Appetite remains poor. Is attempting to eat as much as possible. Reports she will continue to push protein and eat as much as she can tolerate. Knows the importance of her nutrition.   Nausea  Haydn shares over the past week she has experienced increase nausea and vomiting. Reports the compazine is effective. We discussed regimen of how she is taking compazine. She is taking the compazine twice daily. Education provided on availability to take every 6 hours as needed. Encouraged her take around the clock for the next 24 hours to gain better control and can then go back to as needed basis. She verbalized understanding.   I discussed the importance of continued conversation with family and their medical providers regarding overall plan of care and treatment options, ensuring decisions are within the context of the patients values and GOCs.  PLAN: MS Contin 30 mg QHS. She is not able to tolerate 15  mg BID due to drowsiness. Continues to have pain. Recommended increased dosage at night to minimize drowsiness in the day and need for BID.  Lidoderm patch to left hip area Hydromorphone for breakthrough (Dr. Burr Medico is refilling today) Compazine Q6H as needed for nausea. Will plan to take around the clock over the next 24 hours.  Ongoing goals of care discussions.  I will plan to see her back in 2-3 weeks.    Patient expressed understanding and was in agreement with this plan. She also understands that She can call the clinic at any time with any questions, concerns, or complaints.   Time Total: 40 min  Visit consisted of counseling and education dealing with the complex and emotionally intense issues of symptom management and palliative care in the setting of serious and potentially life-threatening illness.Greater than 50%  of this time was spent counseling and coordinating care related to the above assessment and plan.  Signed by: Alda Lea, AGPCNP-BC [facility]

## 2021-01-24 NOTE — Progress Notes (Signed)
Following final rate increase for blood transfusion, patient noted nausea and chest discomfort via translator 10 minutes into the infusion.  Transfusion stopped and disconnected and 1 L NS connected and hung to gravity. Anda Kraft, [name] called and assessed patient at bedside. Vitals remained stable at this time and pt noted that discomfort was easing.   Received verbal orders for 650 mg tylenol. Waited 15 minutes following administration of tylenol before restarting blood.  Blood restarted at 200 ml/hr (despite approval to run at 300 ml/hr from Dr. Burr Medico). Vitals retaken 15 minutes after restarting and remained stable. Patient no longer has any complaints and notes no further pain.

## 2021-01-24 NOTE — Progress Notes (Signed)
Nikki in Blood Bank confirmed orders T&S and Prepare orders.

## 2021-01-25 ENCOUNTER — Encounter: Payer: Self-pay | Admitting: Hematology

## 2021-01-27 ENCOUNTER — Ambulatory Visit: Payer: Medicaid Other

## 2021-01-27 ENCOUNTER — Telehealth: Payer: Self-pay

## 2021-01-27 ENCOUNTER — Other Ambulatory Visit: Payer: Self-pay

## 2021-01-27 LAB — TYPE AND SCREEN
ABO/RH(D): O POS
Antibody Screen: NEGATIVE
Unit division: 0

## 2021-01-27 LAB — BPAM RBC
Blood Product Expiration Date: 202302132359
ISSUE DATE / TIME: 202301131225
Unit Type and Rh: 5100

## 2021-01-27 NOTE — Telephone Encounter (Signed)
-----   Message from Evalee Jefferson, RN sent at 01/27/2021  9:53 AM EST ----- Regarding: Please get PET Scan scheduled by 02/05/2021. Please get PET Scan scheduled by 02/05/2021.  Call Central Scheduling [phone].  Call pt's husband after you have her scheduled to confirm that they have the contrast for her to drink for the PET Scan.  Thanks!  ----- Message ----- From: Truitt Merle, MD Sent: 01/25/2021  12:20 PM EST To: Evalee Jefferson, RN  Please get her PET scan scheduled before her next visit on 1/27, thx   Krista Blue

## 2021-01-27 NOTE — Telephone Encounter (Signed)
Pt is scheduled for PET w/ contrast on Jan. 24th at 3:00pm. Left voicemail with home phone, [phone] and with spouse Debby Clyne to give the office a call back regarding this new appt.

## 2021-01-27 NOTE — Telephone Encounter (Signed)
Pt called back with interpreter on the line. Pt was told she has a PET w/ contrast scheduled jan. 24th at 3:00pm and will need to arrive at [facility] by 2:30PM. Pt was also told she has to be NPO after 9am jan. 24th. Pt knows now to come to the [facility] for contrast pickup as soon as possible. Interpretor for pt states she agrees and understands.

## 2021-01-30 ENCOUNTER — Other Ambulatory Visit: Payer: Self-pay | Admitting: Hematology

## 2021-01-30 DIAGNOSIS — C7951 Secondary malignant neoplasm of bone: Secondary | ICD-10-CM

## 2021-01-30 DIAGNOSIS — C50911 Malignant neoplasm of unspecified site of right female breast: Secondary | ICD-10-CM

## 2021-02-01 ENCOUNTER — Encounter: Payer: Self-pay | Admitting: Hematology

## 2021-02-01 MED ORDER — HYDROMORPHONE HCL 2 MG PO TABS: 2.0000 mg | ORAL_TABLET | Freq: Four times a day (QID) | ORAL | 0 refills | Status: DC | PRN

## 2021-02-03 ENCOUNTER — Other Ambulatory Visit: Payer: Self-pay | Admitting: Hematology

## 2021-02-03 DIAGNOSIS — C7951 Secondary malignant neoplasm of bone: Secondary | ICD-10-CM

## 2021-02-03 DIAGNOSIS — C50911 Malignant neoplasm of unspecified site of right female breast: Secondary | ICD-10-CM

## 2021-02-04 ENCOUNTER — Other Ambulatory Visit: Payer: Self-pay

## 2021-02-04 ENCOUNTER — Encounter (HOSPITAL_COMMUNITY)
Admission: RE | Admit: 2021-02-04 | Discharge: 2021-02-04 | Disposition: A | Discharge status: Home / Self Care | Payer: Medicaid Other | Source: Ambulatory Visit | Attending: Hematology | Admitting: Hematology

## 2021-02-04 ENCOUNTER — Other Ambulatory Visit: Payer: Self-pay | Admitting: Nurse Practitioner

## 2021-02-04 ENCOUNTER — Encounter: Payer: Self-pay | Admitting: Hematology

## 2021-02-04 DIAGNOSIS — C7951 Secondary malignant neoplasm of bone: Secondary | ICD-10-CM | POA: Insufficient documentation

## 2021-02-04 DIAGNOSIS — C50911 Malignant neoplasm of unspecified site of right female breast: Secondary | ICD-10-CM | POA: Diagnosis present

## 2021-02-04 DIAGNOSIS — C50919 Malignant neoplasm of unspecified site of unspecified female breast: Secondary | ICD-10-CM | POA: Diagnosis not present

## 2021-02-04 DIAGNOSIS — R590 Localized enlarged lymph nodes: Secondary | ICD-10-CM | POA: Diagnosis not present

## 2021-02-04 LAB — GLUCOSE, CAPILLARY: Glucose-Capillary: 92 mg/dL (ref 70–99)

## 2021-02-04 MED ORDER — FLUDEOXYGLUCOSE F - 18 (FDG) INJECTION
6.0000 | Freq: Once | INTRAVENOUS | Status: AC | PRN
  Administered 2021-02-04: 16:00:00 6.59 via INTRAVENOUS

## 2021-02-04 MED ORDER — LIDOCAINE 5 % EX PTCH: 1.0000 | MEDICATED_PATCH | CUTANEOUS | 0 refills | Status: DC

## 2021-02-04 MED ORDER — MORPHINE SULFATE ER 15 MG PO TBCR: 15.0000 mg | EXTENDED_RELEASE_TABLET | Freq: Two times a day (BID) | ORAL | 0 refills | Status: DC

## 2021-02-05 ENCOUNTER — Ambulatory Visit (HOSPITAL_COMMUNITY)
Admission: RE | Admit: 2021-02-05 | Discharge: 2021-02-05 | Disposition: A | Discharge status: Home / Self Care | Payer: Medicaid Other | Source: Ambulatory Visit | Attending: Hematology | Admitting: Hematology

## 2021-02-05 DIAGNOSIS — Z5181 Encounter for therapeutic drug level monitoring: Secondary | ICD-10-CM | POA: Insufficient documentation

## 2021-02-05 DIAGNOSIS — Z0189 Encounter for other specified special examinations: Secondary | ICD-10-CM

## 2021-02-05 DIAGNOSIS — C7951 Secondary malignant neoplasm of bone: Secondary | ICD-10-CM

## 2021-02-05 DIAGNOSIS — C50911 Malignant neoplasm of unspecified site of right female breast: Secondary | ICD-10-CM

## 2021-02-05 DIAGNOSIS — Z79899 Other long term (current) drug therapy: Secondary | ICD-10-CM | POA: Diagnosis not present

## 2021-02-05 LAB — ECHOCARDIOGRAM COMPLETE
Area-P 1/2: 3.07 cm2
S' Lateral: 3.4 cm

## 2021-02-05 NOTE — Progress Notes (Signed)
°  Echocardiogram 2D Echocardiogram has been performed.  [name] 02/05/2021, 10:53 AM

## 2021-02-07 ENCOUNTER — Ambulatory Visit: Payer: Medicaid Other

## 2021-02-07 ENCOUNTER — Other Ambulatory Visit: Payer: Self-pay

## 2021-02-07 ENCOUNTER — Inpatient Hospital Stay: Payer: Medicaid Other

## 2021-02-07 ENCOUNTER — Inpatient Hospital Stay (HOSPITAL_BASED_OUTPATIENT_CLINIC_OR_DEPARTMENT_OTHER): Payer: Medicaid Other | Admitting: Hematology

## 2021-02-07 ENCOUNTER — Other Ambulatory Visit: Payer: Medicaid Other

## 2021-02-07 ENCOUNTER — Encounter: Payer: Self-pay | Admitting: Hematology

## 2021-02-07 ENCOUNTER — Ambulatory Visit: Payer: Medicaid Other | Admitting: Hematology

## 2021-02-07 VITALS — BP 103/66 | HR 76 | Temp 98.2°F | Resp 18 | Wt 127.1 lb

## 2021-02-07 DIAGNOSIS — C50911 Malignant neoplasm of unspecified site of right female breast: Secondary | ICD-10-CM | POA: Diagnosis not present

## 2021-02-07 DIAGNOSIS — C7951 Secondary malignant neoplasm of bone: Secondary | ICD-10-CM

## 2021-02-07 DIAGNOSIS — Z95828 Presence of other vascular implants and grafts: Secondary | ICD-10-CM

## 2021-02-07 DIAGNOSIS — Z5112 Encounter for antineoplastic immunotherapy: Secondary | ICD-10-CM | POA: Diagnosis not present

## 2021-02-07 LAB — CBC WITH DIFFERENTIAL (CANCER CENTER ONLY)
Abs Immature Granulocytes: 0.98 10*3/uL — ABNORMAL HIGH (ref 0.00–0.07)
Basophils Absolute: 0 10*3/uL (ref 0.0–0.1)
Basophils Relative: 0 %
Eosinophils Absolute: 0 10*3/uL (ref 0.0–0.5)
Eosinophils Relative: 0 %
HCT: 24.7 % — ABNORMAL LOW (ref 36.0–46.0)
Hemoglobin: 8 g/dL — ABNORMAL LOW (ref 12.0–15.0)
Immature Granulocytes: 9 %
Lymphocytes Relative: 7 %
Lymphs Abs: 0.8 10*3/uL (ref 0.7–4.0)
MCH: 29.4 pg (ref 26.0–34.0)
MCHC: 32.4 g/dL (ref 30.0–36.0)
MCV: 90.8 fL (ref 80.0–100.0)
Monocytes Absolute: 0.6 10*3/uL (ref 0.1–1.0)
Monocytes Relative: 6 %
Neutro Abs: 8.7 10*3/uL — ABNORMAL HIGH (ref 1.7–7.7)
Neutrophils Relative %: 78 %
Platelet Count: 93 10*3/uL — ABNORMAL LOW (ref 150–400)
RBC: 2.72 MIL/uL — ABNORMAL LOW (ref 3.87–5.11)
RDW: 16.9 % — ABNORMAL HIGH (ref 11.5–15.5)
WBC Count: 11.1 10*3/uL — ABNORMAL HIGH (ref 4.0–10.5)
nRBC: 0.7 % — ABNORMAL HIGH (ref 0.0–0.2)

## 2021-02-07 LAB — CMP (CANCER CENTER ONLY)
ALT: 8 U/L (ref 0–44)
AST: 19 U/L (ref 15–41)
Albumin: 3.8 g/dL (ref 3.5–5.0)
Alkaline Phosphatase: 94 U/L (ref 38–126)
Anion gap: 8 (ref 5–15)
BUN: 9 mg/dL (ref 6–20)
CO2: 27 mmol/L (ref 22–32)
Calcium: 8.9 mg/dL (ref 8.9–10.3)
Chloride: 105 mmol/L (ref 98–111)
Creatinine: 0.79 mg/dL (ref 0.44–1.00)
GFR, Estimated: >60 mL/min (ref >60)
Glucose, Bld: 105 mg/dL — ABNORMAL HIGH (ref 70–99)
Potassium: 3.5 mmol/L (ref 3.5–5.1)
Sodium: 140 mmol/L (ref 135–145)
Total Bilirubin: 1.8 mg/dL — ABNORMAL HIGH (ref 0.3–1.2)
Total Protein: 6.1 g/dL — ABNORMAL LOW (ref 6.5–8.1)

## 2021-02-07 MED ORDER — SODIUM CHLORIDE 0.9% FLUSH
10.0000 mL | INTRAVENOUS | Status: DC | PRN
  Administered 2021-02-07: 10 mL via INTRAVENOUS

## 2021-02-07 MED ORDER — ACETAMINOPHEN 325 MG PO TABS
650.0000 mg | ORAL_TABLET | Freq: Once | ORAL | Status: AC
  Administered 2021-02-07: 650 mg via ORAL
  Filled 2021-02-07: qty 2

## 2021-02-07 MED ORDER — MEGESTROL ACETATE 400 MG/10ML PO SUSP: 400.0000 mg | Freq: Every day | ORAL | 0 refills | Status: DC

## 2021-02-07 MED ORDER — PROCHLORPERAZINE MALEATE 10 MG PO TABS
10.0000 mg | ORAL_TABLET | Freq: Once | ORAL | Status: AC
  Administered 2021-02-07: 10 mg via ORAL
  Filled 2021-02-07: qty 1

## 2021-02-07 MED ORDER — SODIUM CHLORIDE 0.9 % IV SOLN: Freq: Once | INTRAVENOUS | Status: AC

## 2021-02-07 MED ORDER — TRASTUZUMAB-DKST CHEMO 150 MG IV SOLR
4.0000 mg/kg | Freq: Once | INTRAVENOUS | Status: AC
  Administered 2021-02-07: 231 mg via INTRAVENOUS
  Filled 2021-02-07: qty 11

## 2021-02-07 MED ORDER — LORATADINE 10 MG PO TABS
10.0000 mg | ORAL_TABLET | Freq: Every day | ORAL | Status: DC
  Administered 2021-02-07: 10 mg via ORAL
  Filled 2021-02-07: qty 1

## 2021-02-07 MED ORDER — HEPARIN SOD (PORK) LOCK FLUSH 100 UNIT/ML IV SOLN
500.0000 [IU] | Freq: Once | INTRAVENOUS | Status: AC | PRN
  Administered 2021-02-07: 500 [IU]

## 2021-02-07 MED ORDER — SODIUM CHLORIDE 0.9% FLUSH
10.0000 mL | INTRAVENOUS | Status: DC | PRN
  Administered 2021-02-07: 10 mL

## 2021-02-07 MED ORDER — VINORELBINE TARTRATE CHEMO INJECTION 50 MG/5ML
18.0000 mg/m2 | Freq: Once | INTRAVENOUS | Status: AC
  Administered 2021-02-07: 30 mg via INTRAVENOUS
  Filled 2021-02-07: qty 3

## 2021-02-07 NOTE — Progress Notes (Signed)
Okay to treat with plt 97 and Ttl Bili 1.8 per Dr. Burr Medico

## 2021-02-07 NOTE — Patient Instructions (Signed)
[facility]  Discharge Instructions: Thank you for choosing [facility] to provide your oncology and hematology care.   If you have a lab appointment with the [facility], please go directly to the [facility] and check in at the registration area.   Wear comfortable clothing and clothing appropriate for easy access to any Portacath or PICC line.   We strive to give you quality time with your provider. You may need to reschedule your appointment if you arrive late (15 or more minutes).  Arriving late affects you and other patients whose appointments are after yours.  Also, if you miss three or more appointments without notifying the office, you may be dismissed from the clinic at the providers discretion.      For prescription refill requests, have your pharmacy contact our office and allow 72 hours for refills to be completed.    Today you received the following chemotherapy and/or immunotherapy agents : Trastuzumab, Navelbine      To help prevent nausea and vomiting after your treatment, we encourage you to take your nausea medication as directed.  BELOW ARE SYMPTOMS THAT SHOULD BE REPORTED IMMEDIATELY: *FEVER GREATER THAN 100.4 F (38 C) OR HIGHER *CHILLS OR SWEATING *NAUSEA AND VOMITING THAT IS NOT CONTROLLED WITH YOUR NAUSEA MEDICATION *UNUSUAL SHORTNESS OF BREATH *UNUSUAL BRUISING OR BLEEDING *URINARY PROBLEMS (pain or burning when urinating, or frequent urination) *BOWEL PROBLEMS (unusual diarrhea, constipation, pain near the anus) TENDERNESS IN MOUTH AND THROAT WITH OR WITHOUT PRESENCE OF ULCERS (sore throat, sores in mouth, or a toothache) UNUSUAL RASH, SWELLING OR PAIN  UNUSUAL VAGINAL DISCHARGE OR ITCHING   Items with * indicate a potential emergency and should be followed up as soon as possible or go to the Emergency Department if any problems should occur.  Please show the CHEMOTHERAPY ALERT CARD or IMMUNOTHERAPY ALERT CARD  at check-in to the Emergency Department and triage nurse.  Should you have questions after your visit or need to cancel or reschedule your appointment, please contact [facility]  Dept: [phone]  and follow the prompts.  Office hours are 8:00 a.m. to 4:30 p.m. Monday - Friday. Please note that voicemails left after 4:00 p.m. may not be returned until the following business day.  We are closed weekends and major holidays. You have access to a nurse at all times for urgent questions. Please call the main number to the clinic Dept: [phone] and follow the prompts.   For any non-urgent questions, you may also contact your provider using MyChart. We now offer e-Visits for anyone 56 and older to request care online for non-urgent symptoms. For details visit [email or web address].   Also download the MyChart app! Go to the app store, search "MyChart", open the app, select [facility], and log in with your MyChart username and password.  Due to Covid, a mask is required upon entering the hospital/clinic. If you do not have a mask, one will be given to you upon arrival. For doctor visits, patients may have 1 support person aged 56 or older with them. For treatment visits, patients cannot have anyone with them due to current Covid guidelines and our immunocompromised population.

## 2021-02-07 NOTE — Progress Notes (Signed)
[place]   [phone] [phone]   Clinic Follow up Note   Patient Care Team: [name], [name], [name] (Inactive) as PCP - General (Physician Assistant) [name], MD as Consulting Physician (Orthopedic Surgery) Pickenpack-Cousar, Carlena Sax, NP as Nurse Practitioner (Nurse Practitioner)  Date of Service:  02/07/2021  CHIEF COMPLAINT: f/u of metastatic breast cancer  CURRENT THERAPY:  Trastuzumab D1, and Vinorelbine D1 and 8 every 21 days, starting 11/01/20, changed to q14d 02/07/21 -Zometa q72month  ASSESSMENT & PLAN:  Debra LASKYis a 56y.o. female with   1. 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 She had right mastectomy, 4 cycles of AC-T and 5 years of Tamoxifen/AI. -In 06/2015 she had breast cancer recurrence metastatic to the bone.  -she has had multiple endocrine therapy and chemotherapy, overall does not tolerate treatment well and frequently required dose reduction -We switched her treatment to CMF and Keytruda on 08/02/20, and continued herceptin. Due to poor tolerance, I dropped the methotrexate and fluorouracil and delayed her third cycle to 09/20/20, so she received cytoxan, kBeryle Flock and herceptin. She tolerated much better with just some fatigue and mild headaches. -restaging PET 10/31/20 unfortunately showed significantly worsening disease. -she was switched to trastuzumab and vinorelbine on 11/01/20. She is tolerating moderately well with nausea and fatigue, dose reduced after cycle 1  -PET scan 02/04/21 showed overall stable disease with some new, small mediastinal and hilar lymph nodes. I reviewed the results with them today. Her tumor marker is stable, plan to continue chemo  -I discussed changing her treatment to once every two weeks due to her tolerance. She is interested in continuing treatment and agrees with this plan.     2. Symptom Management: nausea and vomiting with weight loss, depression with fatigue -with her increased bone pains, she has had more nausea and vomiting. Because of this, she has lost weight.  -she did not tolerate Boost or Ensure. We previously reviewed other options. She has gained some weight back in the last few weeks. -I also prescribed Megace for her on 12/31/20. She states today she never picked this up. I sent in another prescription today. -she reports the compazine is helpful for her nausea. -she reports she is trying to get up and do more recently.   3. Body pain, secondary to bone mets   -She is being treated with Zometa q347month -She will continue oral calcium and Vit D and Prilosec for Acid Reflux.  -She has received palliative radiation therapy to the left femur (2017), right sacroiliac area (2019), and right femur and left hip (11/28-12/14/22) under Dr. KiSondra Come-she currently has MS contin and dilaudid for pain, which she feels is more helpful than the oxycodone.   4. Goal of care discussion, DNR/DNI -We previously reviewed goal of care and I recommend DNR/DNI, she agreed again 07/19/20. -I again encouraged her to start setting up her living will. Her husband [name] automatically her HCPOA. She is considering changing her HCPOA to her daughter Debra Shirewhom she lives with.  -Though her care is palliative, our goal is to prolong her life. She would like to continue treatment. -she declines home palliative care as she does not want a stranger to come into her home.   5. Congenital deafness   6. Mild anemia, secondary to chemotherapy and malignancy  -her hgb decreased to 7.8 on 09/13/20.  -improved off  methotrexate and fluorouraci.  -monitoring on trastuzumab/vinorelbine. 1u pRBC given 01/24/21   7. 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 Mild, only in her feet. -worsened with CMF, remains mild off treatment.     PLAN:   -proceed with C5D1 trastuzumab/vinorelbine today -I  called in Megace again today   -we will check on refills sent on 1/21 and 1/24 -lab, f/u, and C6 in 2 weeks   No problem-specific Assessment & Plan notes found for this encounter.   SUMMARY OF ONCOLOGIC HISTORY: Oncology History Overview Note   Cancer Staging No matching staging information was found for the patient.     [name], right ([place])  11/1997 Cancer Diagnosis   Patient had 1.4 cm poorly differentiated right breast carcinoma diagnosed in Nov 1999 at age 25, 1/7 axillary nodes involved, ER/PR and HER 2 positive. She had mastectomy with the 7 axillary node evaluation, 4 cycles of adriamycin/ cytoxan followed by taxotere, then five years of tamoxifen thru June 2005 (no herceptin in 1999). She had bilateral oophorectomy in May 2005. She was briefly on aromatase inhibitor after tamoxifen, but discontinued this herself due to poor tolerance.   04/04/2015 Genetic Testing   Negative genetic testing on the breast/ovarian cancer pnael.  The Breast/Ovarian gene panel offered by [facility] includes sequencing and rearrangement analysis for the following 20 genes:  ATM, BARD1, BRCA1, BRCA2, BRIP1, CDH1, CHEK2, EPCAM, FANCC, MLH1, MSH2, MSH6, NBN, PALB2, PMS2, PTEN, RAD51C, RAD51D, TP53, and XRCC2.   06/26/2015 Pathology Results   Initial Path Report Bone, curettage, Left femur met, intramedullary subtroch tissue - METASTATIC ADENOCARCINOMA.  Estrogen Receptor: 95%, POSITIVE, STRONG STAINING INTENSITY Progesterone Receptor: 2%, POSITIVE, STRONG  HER2 (+), IHC 3+   06/26/2015 Surgery   Biopsy of metastatic tissue and stabilization with Affixus trochanteric nail, proximal and distal interlock of left femur by Dr. [name]    06/28/2015 Imaging   CT Chest w/ Contrast 1. Extensive right internal mammary chain lymphadenopathy consistent with metastatic breast cancer. 2. Lytic metastases involving the posterior elements at T1 with probable nondisplaced pathologic fracture  of the spinous process. 3. No other evidence of thoracic metastatic disease. 4. Trace bilateral pleural effusions with associated bibasilar atelectasis. 5. Indeterminate right thyroid nodule, not previously imaged. This could be evaluated with thyroid ultrasound as clinically warranted.   06/29/2015 -  Chemotherapy   Zometa started monthly on 06/29/15 and switched to q49month from 01/08/17   07/01/2015 Imaging   Bone Scan 1. Increased activity noted throughout the left femur. Although metastatic disease cannot be excluded, these changes are most likely secondary to prior surgery. 2. No other focal abnormalities identified to suggest metastatic disease.   07/12/2015 - 07/26/2015 Radiation Therapy   Left femur, 30 Gy in 10 fractions by Dr. [name]   07/14/2015 Initial Diagnosis   [name], right ([place]   07/19/2015 Imaging   Initial PET Scan 1. Severe multifocal osseous metastatic disease, much greater than anticipated based on prior imaging. 2. Multifocal nodal metastases involving the right internal mammary, prevascular and subpectoral lymph nodes. No axillary pulmonary involvement identified. 3. No distant extra osseous metastases.   07/30/2015 - 11/29/2015 Chemotherapy   Docetaxel, Herceptin and Perjeta every 3 weeks X6 cycles, pt tolerated moderately well, restaging scan showed excellent response    09/18/2015 Imaging   CT Head w/wo Contrast 1. No acute intracranial abnormality or significant interval change. 2. Stable minimal periventricular white matter hypoattenuation on the right. This may reflect a remote  ischemic injury. 3. No evidence for metastatic disease to the brain. 4. No focal soft tissue lesion to explain the patient's tenderness.   10/30/2015 Imaging   CT Abdomen Pelvis w/ Contrast 1. Few mildly prominent fluid-filled loops of small bowel scattered throughout the abdomen, with intraluminal fluid density within the distal colon. Given the  provided history, findings are suggestive of acute enteritis/diarrheal illness. 2. No other acute intra-abdominal or pelvic process identified. 3. Widespread osseous metastatic disease, better evaluated on most recent PET-CT from 07/19/2015. No pathologic fracture or other complication. 4. 11 mm hypodensity within the left kidney. This lesion measures intermediate density, and is indeterminate. While this lesion is similar in size relative to recent studies, this is increased in size relative to prior study from 2012. Further evaluation with dedicated renal mass protocol CT and/or MRI is recommended for complete characterization.   12/2015 - 05/14/2016 Chemotherapy   Maintenance Herceptin and pejeta every 3 weeks stopped on 05/14/16 due to disease progression   12/25/2015 Imaging   Restaging PET Scan 1. Markedly improved skeletal activity, with only several faint foci of residual accentuated metabolic activity, but resolution of the vast majority of the previously extensive osseous metastatic disease. 2. Resolution of the prior right internal mammary and right prevascular lymph nodes. 3. Residual subcutaneous edema and edema along fascia planes in the left upper thigh. This remains somewhat more than I would expect for placement of an IM nail 6 weeks ago. If there is leg swelling further down, consider Doppler venous ultrasound to rule out left lower extremity DVT. I do not perceive an obvious difference in density between the pelvic and common femoral veins on the noncontrast CT data. 4. Chronic right maxillary and right sphenoid sinusitis.   01/16/2016 - 10/20/2017 Anti-estrogen oral therapy   Letrozole 2.5 mg daily. She was switched to exemestane due to disease progression on 02/08/17. Stopped on 10/20/2017 when treatment changed to chemo    03/01/2016 Miscellaneous   Patient presented to ED following a fall; she reports she landed on her back and is now experiencing back pain. The patient was evaluated  and discharge home same day with pain control.   05/01/2016 Imaging   CT CAP IMPRESSION: 1. Stable exam.  No new or progressive disease identified. 2. No evidence for residual or recurrent adenopathy within the chest. 3. Stable bone metastasis.   05/01/2016 Imaging   BONE SCAN IMPRESSION: 1. Small focal uptake involving the anterior rib ends of the left second and right fourth ribs, new since the prior bone scan. The location of this uptake is more suggestive of a traumatic or inflammatory etiology as opposed to metastatic disease. No other evidence of metastatic disease. 2. There are other areas of stable uptake which are most likely degenerative/reactive, including right shoulder and cervical spine uptake and uptake along the right femur adjacent to the ORIF hardware.   05/20/2016 Imaging   NM PET Skull to Thigh IMPRESSION: 1. There multiple metastatic foci in the bony pelvis, including some new abnormal foci compare to the prior PET-CT, compatible with mildly progressive bony metastatic disease. Also, a left T11 vertebral hypermetabolic metastatic lesion is observed, new compared to the prior exam. 2. No active extraosseous malignancy is currently identified. 3. Right anterior fourth rib healing fracture, appears be   06/04/2016 - 01/08/2017 Chemotherapy   Second line chemotherapy Kadcyla every 3 weeks started on 06/04/16 and stopped on 01/08/17 due to mixed response.     10/05/2016 Imaging   CT CAP and  Bone Scan:  Bones: left intramedullary rod and left femoral head neck screw in place. In the proximal diaphysis of the left femur there is cortical thickening and irregularity. No lytic or blastic bone lesions. No fracture or vertebral endplate destruction.   No definite evidence of recurrent disease in the chest, abdomen, or pelvis.   10/28/2016 Imaging   CT A/P IMPRESSION: No acute process demonstrated in the abdomen or pelvis. No evidence of bowel obstruction or  inflammation.   11/04/2016 Imaging   DG foot complete left: IMPRESSION: No fracture or dislocation.  No soft tissue abnormality   11/23/2016 Imaging   CT RIGHT FEMUR IMPRESSION: Negative CT scan of the right thigh.  No visible metastatic disease.  CT LEFT FEMUR IMPRESSION: 1. Postsurgical changes related to prior cephalomedullary rod fixation of the left femur with linear lucency along the anterolateral proximal femoral cortex at level of the lesser trochanter, suspicious for nondisplaced fracture. 2. Slightly permeative appearance of the proximal left femoral cortex at the site of known prior osseous metastases is largely unchanged. 3. Unchanged patchy lucency and sclerosis along the anterior acetabulum, which appears to correspond with an area of faint uptake on the prior PET-CT, suspicious for osseous metastasis. These results will be called to the ordering clinician or representative by the Radiologist Assistant, and communication documented in the PACS or zVision Dashboard.       01/27/2017 Imaging   IMPRESSION: 1. Mixed response to osseous metastasis. Some hypermetabolic lesions are new and increasingly hypermetabolic, while another index lesion is decreasingly hypermetabolic. 2. No evidence of hypermetabolic soft tissue metastasis.     02/08/2017 - 10/19/2017 Antibody Plan   -Herceptin every 3 week since 02/08/17 -Oral Lanpantinib 1549m and decreased to 5065mdue to diarrhea and depression. Due to disease progression she swithced to Verzenio 15016mn 05/04/17. She did not toelrate well so she was switched to Ibrance 100m59m 05/31/17. Due to her neutropenia, Ibrance dose reduced to 75 mg daily, 3 weeks on, one-week off, stopped on 10/20/2017 due to disease progression.    04/30/2017 PET scan   IMPRESSION: 1. Primarily increase in hypermetabolism of osseous metastasis. An isolated right acetabular lesion is decreased in hypermetabolism since the prior exam. 2. No evidence  of hypermetabolic soft tissue metastasis. 3. Similar non FDG avid right-sided thyroid nodule, favoring a benign etiology. Recommend attention on follow-up.   08/30/2017 PET scan   08/30/2017 PET Scan  IMPRESSION: 1. Worsening osseous metastatic disease. 2. Focal hypermetabolism in the central spinal canal the L1 level, similar to the prior exam. Metastatic implant cannot be excluded. 3. Slight enlargement of a right thyroid nodule which now appears more cystic in character. Consider further evaluation with thyroid ultrasound. If patient is clinically hyperthyroid, consider nuclear medicine thyroid uptake and scan   08/30/2017 Progression   08/30/2017 PET Scan  IMPRESSION: 1. Worsening osseous metastatic disease. 2. Focal hypermetabolism in the central spinal canal the L1 level, similar to the prior exam. Metastatic implant cannot be excluded. 3. Slight enlargement of a right thyroid nodule which now appears more cystic in character. Consider further evaluation with thyroid ultrasound. If patient is clinically hyperthyroid, consider nuclear medicine thyroid uptake and scan   09/29/2017 - 10/11/2017 Radiation Therapy   Pt received 27 Gy in 9 fractions directed at the areas of significant uptake noted on recent bone scan the right pelvis and right proximal femur, with Dr, KinaSondra Come10/09/2017 - 04/07/2018 Chemotherapy   -Herceptin every 3 weeks starting 02/08/17, perjeta  added on 10/20/2017  -weekly Taxol started on 10/21/2017. Will reduce Taxol to 2 weeks on, 1 week off starting on 02/04/17.  Stoped due to mild disease progression   10/27/2017 Imaging   10/27/2017 CT AP IMPRESSION: 1. No acute abnormality. No evidence of bowel obstruction or acute bowel inflammation. Normal appendix. 2. Stable patchy sclerotic osseous metastases throughout the visualized skeleton. No new or progressive metastatic disease in the abdomen or pelvis.   01/11/2018 Imaging   Whole Body Bone Scan 01/11/18   IMPRESSION: 1. Multifocal osseous metastases as described. 2. Right scapular metastasis. 3. Right superior thoracic spinous process. 4. Right sacral ala and right L5 metastases. 5. Right acetabular metastasis. 6. Right proximal femur metastasis. 7. Diffuse metastases throughout the left femur. 8. Anterior right fourth rib metastasis.   01/11/2018 Imaging   CT CAP W Contrast 01/11/18  IMPRESSION: 1. Similar-appearing osseous metastatic disease. 2. No evidence for progressed metastatic disease in the chest, abdomen or pelvis.   03/30/2018 Imaging   Bone Scan 03/30/18  IMPRESSION: Stable multifocal bony metastatic disease.   03/30/2018 Imaging    CT CAP W Contrast 03/30/18 IMPRESSION: 1. New mild contiguous wall thickening with associated mucosal hyperenhancement throughout the left colon and rectum, suggesting nonspecific infectious or inflammatory proctocolitis. Differential includes C diff colitis. 2. Stable faint patchy sclerosis in the iliac bones and lumbar vertebral bodies. No new or progressive osseous metastatic disease by CT. Please note that the osseous lesions are relatively occult by CT and would be better evaluated by PET-CT, as clinically warranted. 3. No evidence of extra osseous metastatic disease.   These results will be called to the ordering clinician or representative by the Radiologist Assistant, and communication documented in the PACS or zVision Dashboard.   04/15/2018 - 06/16/2018 Chemotherapy   Enhertu q3weeks starting 04/15/18. Stopped Enhertu after cycle 3 on 05/27/18 due to poor toleration due to poor toleration with anorexia, weight loss, N&V, diarrhea   04/21/2018 PET scan   PET 04/21/18  IMPRESSION: 1. Overall marked interval improvement in bony hypermetabolic disease. All of the previously identified hypermetabolic osseous metastases have decreased in hypermetabolism in the interval. There is a new tiny focus of hypermetabolism in the right aspect of  the L3 vertebral body that appears to correspond to a new 8 mm lucency, raising concern for new metastatic disease. Close attention on follow-up recommended. 2. Focal hypermetabolism in the central spinal canal at the level of L1 previously has resolved. 3. No hypermetabolic soft tissue disease on today's study. 4. Right thyroid nodule seen previously has decreased in the interval.     06/16/2018 Imaging   CT LUMBAR SPINE WO CONTRAST 06/16/18 IMPRESSION: 1. Transitional lumbosacral anatomy. Correlation with radiographs is recommended prior to any operative intervention. 2. Suspicion of a right L3 vertebral body metastasis on the April PET-CT is not evident by CT. Stable degenerative versus metastatic lesion at the right L5 superior endplate. Small but increased right iliac bone metastasis since March. Lumbar MRI would best characterize lumbar metastatic disease (without contrast if necessary). 3. Suspect L2-L3 left subarticular segment disc herniation. Query left L2 radiculitis. 4. Stable appearance of rightward L4-L5 disc degeneration which could affect the right foramen and right lateral recess.   06/17/2018 - 11/16/2018 Chemotherapy   Restart Taxol 2 weeks on/1 week off with Herceptin and Perjeta q3weeks on 06/17/18. Perjeta was stopped after C5 due to diarrhea. Stopped Taxol after 11/16/18 due to disease progression to L3.    07/06/2018 PET scan   PET  IMPRESSION: 1. Near total resolution of hypermetabolic activity associated with skeletal metastasis. Very mild activity associated with the LEFT iliac bone which is similar to background blood pool activity. 2. No new or progressive disease.   11/21/2018 PET scan   IMPRESSION: 1. New focal hypermetabolic lesion eccentric to the right in the L3 vertebral body, maximum SUV 6.1 compatible with a new focus of active malignancy. 2. Very low-grade activity similar to blood pool in previous existing mildly sclerotic metastatic lesions  including the left iliac bone lesion. Some of the mildly sclerotic lesions are not hypermetabolic compatible with effectively treated bony metastatic disease. 3. No hypermetabolic soft tissue lesions are identified.     11/30/2018 - 08/25/2019 Chemotherapy   Continue Herceptin q3weeks. Stopped 08/25/19 due to significant disease progression and switched to  IV Matuzumab   12/12/2018 - 12/17/2018 Chemotherapy   Tucatinib (TUKYSA) 6 tabs BID as a next line therapy starting 12/12/18. Stopped 12/17/18 duet o N&V, poor appetite and HA    12/21/2018 - 03/17/2019 Chemotherapy   IV Eribulin on day 1, 8 every 21 days starting 12/21/18. Stopped 03/17/19 due to mixed response to treatment.    03/21/2019 PET scan   IMPRESSION: 1. Mixed response to therapy of osseous metastasis. Although a right-sided L4 lesion demonstrates decreased hypermetabolism today, new left iliac and right ischial hypermetabolic lesions are seen. 2. No evidence of hypermetabolic soft tissue metastasis.     03/27/2019 - 07/21/2019 Chemotherapy   Neratinib (Nerlynx) titrate up from 155m to 2423mstarting 03/27/19. She could not tolerate 1607mnd stopped on 02/05/19 due to N&V and diarrhea. Will restart at 120m66m 04/10/18-04/27/19 and 4/19-4/22. Reduced to 80mg13me daily on 05/10/19. Held/stopped since 07/21/19 due to dry cough, Nausea and body aches.   06/23/2019 Imaging   CT Head  IMPRESSION: 1. No acute intracranial abnormality or metastatic disease identified. 2. Chronic white matter disease in the right hemisphere is stable from prior exams.     09/05/2019 PET scan   IMPRESSION: 1. Significant progression of widespread hypermetabolic osseous metastases throughout the axial skeleton as detailed. 2. No hypermetabolic extraosseous sites of metastatic disease.   09/15/2019 - 12/22/2019 Chemotherapy   -IV Margetuximab q3weeks and IV Gemcitabine 2 weeks on/1 week off starting 09/15/19. Carboplatin added to Gemcitabine starting with C3 on  11/03/19. D/c after C5 due to disease progression in bone.    12/13/2019 Imaging   PET  IMPRESSION: 1. Interval progression of widespread, FDG avid osseous metastasis involving the axial skeleton. 2. No FDG avid extra osseous sites of metastatic disease.     01/19/2020 -  Chemotherapy   -Xeloda BID 2 weeks on/1 week off and Herceptin q3weeks starting 01/19/20. C1 at 1000mg 34m C2 increased to 1500mg i41me AM and 1000mg in11m PM starting 02/12/20.   04/09/2020 Imaging   CT CAP  IMPRESSION: 1. New 3 mm RIGHT upper lobe nodule, nonspecific, attention on follow-up. 2. What may represent an incomplete periprosthetic fracture about the cephalo medullary rod in the LEFT proximal femur. This linear band of incomplete bony fusion that is more pronounced than on previous CT imaging that was acquired in 2017 and may represent a stress type fracture about hardware. Correlate with any pain in this area. Sclerotic margins show chronic features but the linear band passing through the bone around the intramedullary rod is more extensive, involving approximately 270 degrees of the circumference of the femur, medial cortex remains intact. 3. Other findings of bony metastatic disease are similar to  prior imaging. Sclerosis and some periosteal reaction about a nondisplaced pathologic fracture of the LEFT lateral seventh rib. 4. Small hypervascular focus in the RIGHT hepatic lobe, indistinct but new compared to previous imaging. Perhaps this represents a transient phenomenon though is not imaged on the later phase to allow for confirmation. Liver MRI may be helpful for further evaluation, to exclude underlying metastatic lesion as clinically warranted. 5. Hepatic steatosis. 6. Aortic atherosclerosis.   Aortic Atherosclerosis (ICD10-I70.0).     07/18/2020 Imaging   PET  IMPRESSION: No signs of visceral or nodal metastatic disease.   Increased number of hypermetabolic lesions with increased  intensity of many of these areas and increased "size" of affected areas based on the regions of FDG uptake. While there are areas that do show diminished FDG uptake such as in the RIGHT scapula on balance the number and size of these areas has increased.   08/02/2020 - 10/11/2020 Chemotherapy   Patient is on Treatment Plan : BREAST Adjuvant CMF IV q21d     10/31/2020 PET scan   IMPRESSION: Interval worsening of disease with new areas of increased metabolic activity associated with LEFT hilar and infrahilar lymph nodes and on balance worsening of hypermetabolic areas within the spine, pelvis and appendicular skeleton as discussed.   Of particular note is the marked interval worsening of disease in the RIGHT proximal femur, no current visible lesion other than subtle sclerosis is noted in this area, attention to this and other substantial load bearing areas on subsequent imaging.   No additional sites of new disease or visceral involvement.   11/01/2020 -  Chemotherapy   Patient is on Treatment Plan : BREAST METASTATIC Trastuzumab D1 + Vinorelbine D1,8 (25) q21d     02/04/2021 PET scan   IMPRESSION: 1. Several new small intensely hypermetabolic mediastinal and hilar lymph nodes. Previous seen LEFT hilar lymph node is increased in metabolic activity. 2. No evidence of pulmonary metastasis. 3. No evidence of liver metastasis or adenopathy in the abdomen pelvis. 4. Evaluation of the hypermetabolic skeletal metastasis is made somewhat difficult by suspected recent GCSF therapy and robust marrow response. Clear decrease in activity in the LEFT iliac bone and sacrum and proximal RIGHT femur. Increased in metabolic activity of the thoracic spine, sternum and proximal humeri is favored related to GCSF type marrow response. Overall no evidence of skeletal metastasis progression.      INTERVAL HISTORY:  LAILIE SMEAD is here for a follow up of metastatic breast cancer. She was last seen by me  on 01/25/21. She presents to the clinic accompanied by her husband and interpreter Juliann Pulse. She reports she is doing some more around the house, but she still gets sick with certain foods. She notes she tries to take the nausea medicine, but she will still vomit. She reports she vomited about 2 days after the blood transfusion, so she is unsure if the vomiting is related to chemo or not.  She also reports some stiffness/soreness to some fingers and itching to her legs   All other systems were reviewed with the patient and are negative.  MEDICAL HISTORY:  Past Medical History:  Diagnosis Date   Abnormal Pap smear    Anxiety    Breast cancer ([place])    Cancer ([place]) 1999   breast-s/p mastectomy, chemo, rad   CIN I (cervical intraepithelial neoplasia I) 2003   by colpo   Congenital deafness    Deaf    Depression    History of radiation therapy  Right femur, Left hip- 12/09/20-12/25/20- Dr. Gery Pray   History of radiation therapy    right pelvis/right femur 09/28/2017-10/11/2017  Dr Gery Pray   Port-A-Cath in place 2017   for chemotherapy   Radiation 07/12/15-07/26/15   left femur 30 Gy   S/P bilateral oophorectomy     SURGICAL HISTORY: Past Surgical History:  Procedure Laterality Date   ABDOMINAL HYSTERECTOMY     INTRAMEDULLARY (IM) NAIL INTERTROCHANTERIC Left 06/26/2015   Procedure: LEFT BIOMET LONG AFFIXS NAIL;  Surgeon: [name], MD;  Location: [facility];  Service: Orthopedics;  Laterality: Left;   IR GENERIC HISTORICAL  08/06/2015   IR US GUIDE VASC ACCESS LEFT 08/06/2015 Aletta Edouard, MD [facility]   IR GENERIC HISTORICAL  08/06/2015   IR FLUORO GUIDE CV LINE LEFT 08/06/2015 Aletta Edouard, MD [facility]   IR GENERIC HISTORICAL  03/05/2016   IR CV LINE INJECTION 03/05/2016 Markus Daft, MD [facility]   LEFT HEART CATH AND CORONARY ANGIOGRAPHY N/A 12/13/2017   Procedure: LEFT HEART CATH AND CORONARY ANGIOGRAPHY;  Surgeon: Leonie Man, MD;  Location: [facility];   Service: Cardiovascular;  Laterality: N/A;   MASTECTOMY     right breast   MASTECTOMY     OVARIAN CYST REMOVAL     RADIOLOGY WITH ANESTHESIA Left 06/25/2015   Procedure: MRI OF LEFT HIP WITH OR WITHOUT CONTRAST;  Surgeon: Medication Radiologist, MD;  Location: [facility];  Service: Radiology;  Laterality: Left;  DR. MCINTYRE/MRI   TUBAL LIGATION      I have reviewed the social history and family history with the patient and they are unchanged from previous note.  ALLERGIES:  is allergic to promethazine hcl, chlorhexidine, and diphenhydramine hcl.  MEDICATIONS:  Current Outpatient Medications  Medication Sig Dispense Refill   dexamethasone (DECADRON) 4 MG tablet Take 1 tablet (4 mg total) by mouth daily. For 3-5 days after chemo (Patient not taking: No sig reported) 30 tablet 0   HYDROmorphone (DILAUDID) 2 MG tablet Take 1 tablet (2 mg total) by mouth every 6 (six) hours as needed for severe pain. 45 tablet 0   lidocaine (LIDODERM) 5 % Place 1 patch onto the skin daily. Remove & Discard patch within 12 hours or as directed by MD 30 patch 0   lidocaine-prilocaine (EMLA) cream Apply 1 application topically as needed. Apply to Porta-Cath 1-2 hours prior to access as directed. 90 g 2   LORazepam (ATIVAN) 0.5 MG tablet Take 1 tablet (0.5 mg total) by mouth every 8 (eight) hours as needed for anxiety (nausea). (Patient not taking: Reported on 11/20/2020) 20 tablet 0   megestrol (MEGACE) 400 MG/10ML suspension Take 10 mLs (400 mg total) by mouth daily. 240 mL 0   morphine (MS CONTIN) 15 MG 12 hr tablet Take 1 tablet (15 mg total) by mouth every 12 (twelve) hours. 60 tablet 0   potassium chloride (KLOR-CON) 10 MEQ tablet Take 1 tablet (10 mEq total) by mouth 3 (three) times daily. 90 tablet 2   prochlorperazine (COMPAZINE) 10 MG tablet TAKE 1 TABLET(10 MG) BY MOUTH EVERY 6 HOURS AS NEEDED FOR NAUSEA OR VOMITING 60 tablet 1   zolpidem (AMBIEN) 10 MG tablet Take 1 tablet (10 mg total) by mouth at bedtime as  needed for sleep. 30 tablet 0   No current facility-administered medications for this visit.   Facility-Administered Medications Ordered in Other Visits  Medication Dose Route Frequency Provider Last Rate Last Admin   loratadine (CLARITIN) tablet 10  mg  10 mg Oral Daily Truitt Merle, MD   10 mg at 02/07/21 0936   potassium chloride SA (K-DUR) CR tablet 40 mEq  40 mEq Oral Once Truitt Merle, MD       sodium chloride 0.9 % 1,000 mL with potassium chloride 10 mEq infusion   Intravenous Continuous Livesay, Tamala Julian, MD   Stopped at 09/10/15 1653   sodium chloride 0.9 % injection 10 mL  10 mL Intravenous PRN Livesay, Lennis P, MD       sodium chloride flush (NS) 0.9 % injection 10 mL  10 mL Intracatheter PRN Truitt Merle, MD   10 mL at 02/07/21 1113    PHYSICAL EXAMINATION: ECOG PERFORMANCE STATUS: 2 - Symptomatic, <50% confined to bed  Vitals:   02/07/21 0853  BP: 103/66  Pulse: 76  Resp: 18  Temp: 98.2 F (36.8 C)  SpO2: 100%   Wt Readings from Last 3 Encounters:  02/07/21 127 lb 2 oz (57.7 kg)  01/24/21 123 lb 12 oz (56.1 kg)  12/27/20 127 lb 8 oz (57.8 kg)     GENERAL:alert, no distress and comfortable SKIN: skin color normal, no rashes or significant lesions EYES: normal, Conjunctiva are pink and non-injected, sclera clear  NEURO: alert & oriented x 3 with fluent speech  LABORATORY DATA:  I have reviewed the data as listed CBC Latest Ref Rng & Units 02/07/2021 01/24/2021 01/17/2021  WBC 4.0 - 10.5 K/uL 11.1(H) 2.1(L) 3.2(L)  Hemoglobin 12.0 - 15.0 g/dL 8.0(L) 7.9(L) 8.9(L)  Hematocrit 36.0 - 46.0 % 24.7(L) 23.8(L) 27.7(L)  Platelets 150 - 400 K/uL 93(L) 165 160     CMP Latest Ref Rng & Units 02/07/2021 01/24/2021 01/17/2021  Glucose 70 - 99 mg/dL 105(H) 95 99  BUN 6 - 20 mg/dL 9 6 8   Creatinine 0.44 - 1.00 mg/dL 0.79 0.93 0.93  Sodium 135 - 145 mmol/L 140 140 140  Potassium 3.5 - 5.1 mmol/L 3.5 3.2(L) 3.7  Chloride 98 - 111 mmol/L 105 103 104  CO2 22 - 32 mmol/L 27 27 28   Calcium  8.9 - 10.3 mg/dL 8.9 9.4 9.6  Total Protein 6.5 - 8.1 g/dL 6.1(L) 6.7 7.0  Total Bilirubin 0.3 - 1.2 mg/dL 1.8(H) 1.3(H) 1.9(H)  Alkaline Phos 38 - 126 U/L 94 69 76  AST 15 - 41 U/L 19 21 20   ALT 0 - 44 U/L 8 14 10       RADIOGRAPHIC STUDIES: I have personally reviewed the radiological images as listed and agreed with the findings in the report. No results found.    No orders of the defined types were placed in this encounter.  All questions were answered. The patient knows to call the clinic with any problems, questions or concerns. No barriers to learning was detected. The total time spent in the appointment was 40 minutes.     Truitt Merle, MD 02/07/2021   I, Wilburn Mylar, am acting as scribe for Truitt Merle, MD.   I have reviewed the above documentation for accuracy and completeness, and I agree with the above.

## 2021-02-08 LAB — CANCER ANTIGEN 27.29: CA 27.29: 128.7 U/mL — ABNORMAL HIGH (ref 0.0–38.6)

## 2021-02-13 ENCOUNTER — Telehealth: Payer: Self-pay | Admitting: Hematology

## 2021-02-13 NOTE — Telephone Encounter (Signed)
Left message with follow-up appointments per 1/27 los. °

## 2021-02-14 ENCOUNTER — Other Ambulatory Visit: Payer: Medicaid Other

## 2021-02-14 ENCOUNTER — Ambulatory Visit: Payer: Medicaid Other

## 2021-02-14 ENCOUNTER — Inpatient Hospital Stay: Payer: Medicaid Other | Admitting: Nurse Practitioner

## 2021-02-14 ENCOUNTER — Inpatient Hospital Stay: Payer: Medicaid Other

## 2021-02-17 ENCOUNTER — Ambulatory Visit: Payer: Medicaid Other

## 2021-02-21 ENCOUNTER — Inpatient Hospital Stay: Payer: Medicaid Other | Admitting: Hematology

## 2021-02-21 ENCOUNTER — Inpatient Hospital Stay: Payer: Medicaid Other | Attending: Hematology

## 2021-02-21 ENCOUNTER — Inpatient Hospital Stay: Payer: Medicaid Other

## 2021-02-21 DIAGNOSIS — T451X5A Adverse effect of antineoplastic and immunosuppressive drugs, initial encounter: Secondary | ICD-10-CM | POA: Insufficient documentation

## 2021-02-21 DIAGNOSIS — R5383 Other fatigue: Secondary | ICD-10-CM | POA: Insufficient documentation

## 2021-02-21 DIAGNOSIS — Z5112 Encounter for antineoplastic immunotherapy: Secondary | ICD-10-CM | POA: Insufficient documentation

## 2021-02-21 DIAGNOSIS — F32A Depression, unspecified: Secondary | ICD-10-CM | POA: Insufficient documentation

## 2021-02-21 DIAGNOSIS — G893 Neoplasm related pain (acute) (chronic): Secondary | ICD-10-CM | POA: Insufficient documentation

## 2021-02-21 DIAGNOSIS — C50911 Malignant neoplasm of unspecified site of right female breast: Secondary | ICD-10-CM

## 2021-02-21 DIAGNOSIS — Z5111 Encounter for antineoplastic chemotherapy: Secondary | ICD-10-CM | POA: Insufficient documentation

## 2021-02-21 DIAGNOSIS — Z170 Estrogen receptor positive status [ER+]: Secondary | ICD-10-CM | POA: Insufficient documentation

## 2021-02-21 DIAGNOSIS — Z79899 Other long term (current) drug therapy: Secondary | ICD-10-CM | POA: Insufficient documentation

## 2021-02-21 DIAGNOSIS — I700 Atherosclerosis of aorta: Secondary | ICD-10-CM | POA: Insufficient documentation

## 2021-02-21 DIAGNOSIS — G620 Drug-induced polyneuropathy: Secondary | ICD-10-CM | POA: Insufficient documentation

## 2021-02-21 DIAGNOSIS — C7951 Secondary malignant neoplasm of bone: Secondary | ICD-10-CM | POA: Insufficient documentation

## 2021-02-21 DIAGNOSIS — D6481 Anemia due to antineoplastic chemotherapy: Secondary | ICD-10-CM | POA: Insufficient documentation

## 2021-02-27 ENCOUNTER — Other Ambulatory Visit: Payer: Self-pay | Admitting: Hematology

## 2021-02-27 DIAGNOSIS — G4700 Insomnia, unspecified: Secondary | ICD-10-CM

## 2021-02-28 ENCOUNTER — Encounter: Payer: Self-pay | Admitting: Hematology

## 2021-02-28 MED ORDER — ZOLPIDEM TARTRATE 10 MG PO TABS: 10.0000 mg | ORAL_TABLET | Freq: Every evening | ORAL | 0 refills | Status: DC | PRN

## 2021-03-05 ENCOUNTER — Other Ambulatory Visit: Payer: Self-pay | Admitting: Hematology

## 2021-03-05 DIAGNOSIS — G4700 Insomnia, unspecified: Secondary | ICD-10-CM

## 2021-03-05 MED ORDER — ZOLPIDEM TARTRATE 10 MG PO TABS: 10.0000 mg | ORAL_TABLET | Freq: Every evening | ORAL | 0 refills | Status: DC | PRN

## 2021-03-07 ENCOUNTER — Inpatient Hospital Stay: Payer: Medicaid Other

## 2021-03-07 ENCOUNTER — Inpatient Hospital Stay (HOSPITAL_BASED_OUTPATIENT_CLINIC_OR_DEPARTMENT_OTHER): Payer: Medicaid Other | Admitting: Hematology

## 2021-03-07 ENCOUNTER — Encounter: Payer: Self-pay | Admitting: Hematology

## 2021-03-07 ENCOUNTER — Other Ambulatory Visit: Payer: Self-pay

## 2021-03-07 ENCOUNTER — Encounter: Payer: Self-pay | Admitting: Nurse Practitioner

## 2021-03-07 ENCOUNTER — Inpatient Hospital Stay (HOSPITAL_BASED_OUTPATIENT_CLINIC_OR_DEPARTMENT_OTHER): Payer: Medicaid Other | Admitting: Nurse Practitioner

## 2021-03-07 ENCOUNTER — Telehealth: Payer: Self-pay

## 2021-03-07 VITALS — BP 118/61 | HR 79 | Temp 98.3°F | Resp 18 | Wt 120.0 lb

## 2021-03-07 DIAGNOSIS — C50911 Malignant neoplasm of unspecified site of right female breast: Secondary | ICD-10-CM

## 2021-03-07 DIAGNOSIS — G893 Neoplasm related pain (acute) (chronic): Secondary | ICD-10-CM

## 2021-03-07 DIAGNOSIS — C50919 Malignant neoplasm of unspecified site of unspecified female breast: Secondary | ICD-10-CM

## 2021-03-07 DIAGNOSIS — R110 Nausea: Secondary | ICD-10-CM | POA: Diagnosis not present

## 2021-03-07 DIAGNOSIS — R634 Abnormal weight loss: Secondary | ICD-10-CM

## 2021-03-07 DIAGNOSIS — C7951 Secondary malignant neoplasm of bone: Secondary | ICD-10-CM | POA: Diagnosis not present

## 2021-03-07 DIAGNOSIS — G620 Drug-induced polyneuropathy: Secondary | ICD-10-CM | POA: Diagnosis not present

## 2021-03-07 DIAGNOSIS — R5383 Other fatigue: Secondary | ICD-10-CM | POA: Diagnosis not present

## 2021-03-07 DIAGNOSIS — G4700 Insomnia, unspecified: Secondary | ICD-10-CM | POA: Diagnosis not present

## 2021-03-07 DIAGNOSIS — Z170 Estrogen receptor positive status [ER+]: Secondary | ICD-10-CM | POA: Diagnosis not present

## 2021-03-07 DIAGNOSIS — Z95828 Presence of other vascular implants and grafts: Secondary | ICD-10-CM

## 2021-03-07 DIAGNOSIS — Z5111 Encounter for antineoplastic chemotherapy: Secondary | ICD-10-CM | POA: Diagnosis present

## 2021-03-07 DIAGNOSIS — F32A Depression, unspecified: Secondary | ICD-10-CM | POA: Diagnosis not present

## 2021-03-07 DIAGNOSIS — T451X5A Adverse effect of antineoplastic and immunosuppressive drugs, initial encounter: Secondary | ICD-10-CM | POA: Diagnosis not present

## 2021-03-07 DIAGNOSIS — Z79899 Other long term (current) drug therapy: Secondary | ICD-10-CM | POA: Diagnosis not present

## 2021-03-07 DIAGNOSIS — I700 Atherosclerosis of aorta: Secondary | ICD-10-CM | POA: Diagnosis not present

## 2021-03-07 DIAGNOSIS — R630 Anorexia: Secondary | ICD-10-CM | POA: Diagnosis not present

## 2021-03-07 DIAGNOSIS — Z515 Encounter for palliative care: Secondary | ICD-10-CM

## 2021-03-07 DIAGNOSIS — R531 Weakness: Secondary | ICD-10-CM

## 2021-03-07 DIAGNOSIS — D6481 Anemia due to antineoplastic chemotherapy: Secondary | ICD-10-CM | POA: Diagnosis not present

## 2021-03-07 DIAGNOSIS — Z5112 Encounter for antineoplastic immunotherapy: Secondary | ICD-10-CM | POA: Diagnosis not present

## 2021-03-07 LAB — CMP (CANCER CENTER ONLY)
ALT: 9 U/L (ref 0–44)
AST: 20 U/L (ref 15–41)
Albumin: 3.9 g/dL (ref 3.5–5.0)
Alkaline Phosphatase: 66 U/L (ref 38–126)
Anion gap: 7 (ref 5–15)
BUN: 8 mg/dL (ref 6–20)
CO2: 27 mmol/L (ref 22–32)
Calcium: 9.3 mg/dL (ref 8.9–10.3)
Chloride: 104 mmol/L (ref 98–111)
Creatinine: 0.87 mg/dL (ref 0.44–1.00)
GFR, Estimated: >60 mL/min (ref >60)
Glucose, Bld: 95 mg/dL (ref 70–99)
Potassium: 3.6 mmol/L (ref 3.5–5.1)
Sodium: 138 mmol/L (ref 135–145)
Total Bilirubin: 1.8 mg/dL — ABNORMAL HIGH (ref 0.3–1.2)
Total Protein: 6.6 g/dL (ref 6.5–8.1)

## 2021-03-07 LAB — CBC WITH DIFFERENTIAL (CANCER CENTER ONLY)
Abs Immature Granulocytes: 0.07 10*3/uL (ref 0.00–0.07)
Basophils Absolute: 0 10*3/uL (ref 0.0–0.1)
Basophils Relative: 1 %
Eosinophils Absolute: 0 10*3/uL (ref 0.0–0.5)
Eosinophils Relative: 1 %
HCT: 22 % — ABNORMAL LOW (ref 36.0–46.0)
Hemoglobin: 7.1 g/dL — ABNORMAL LOW (ref 12.0–15.0)
Immature Granulocytes: 2 %
Lymphocytes Relative: 15 %
Lymphs Abs: 0.5 10*3/uL — ABNORMAL LOW (ref 0.7–4.0)
MCH: 30.3 pg (ref 26.0–34.0)
MCHC: 32.3 g/dL (ref 30.0–36.0)
MCV: 94 fL (ref 80.0–100.0)
Monocytes Absolute: 0.3 10*3/uL (ref 0.1–1.0)
Monocytes Relative: 10 %
Neutro Abs: 2.5 10*3/uL (ref 1.7–7.7)
Neutrophils Relative %: 71 %
Platelet Count: 142 10*3/uL — ABNORMAL LOW (ref 150–400)
RBC: 2.34 MIL/uL — ABNORMAL LOW (ref 3.87–5.11)
RDW: 18.6 % — ABNORMAL HIGH (ref 11.5–15.5)
WBC Count: 3.6 10*3/uL — ABNORMAL LOW (ref 4.0–10.5)
nRBC: 0.6 % — ABNORMAL HIGH (ref 0.0–0.2)

## 2021-03-07 LAB — VITAMIN D 25 HYDROXY (VIT D DEFICIENCY, FRACTURES): Vit D, 25-Hydroxy: 15.28 ng/mL — ABNORMAL LOW (ref 30–100)

## 2021-03-07 LAB — PREPARE RBC (CROSSMATCH)

## 2021-03-07 MED ORDER — ZOLPIDEM TARTRATE 10 MG PO TABS: 10.0000 mg | ORAL_TABLET | Freq: Every evening | ORAL | 0 refills | Status: DC | PRN

## 2021-03-07 MED ORDER — LORATADINE 10 MG PO TABS
10.0000 mg | ORAL_TABLET | Freq: Every day | ORAL | Status: DC
  Administered 2021-03-07: 10 mg via ORAL
  Filled 2021-03-07: qty 1

## 2021-03-07 MED ORDER — ACETAMINOPHEN 325 MG PO TABS
650.0000 mg | ORAL_TABLET | Freq: Once | ORAL | Status: AC
  Administered 2021-03-07: 650 mg via ORAL
  Filled 2021-03-07: qty 2

## 2021-03-07 MED ORDER — TRASTUZUMAB-DKST CHEMO 150 MG IV SOLR
6.0000 mg/kg | Freq: Once | INTRAVENOUS | Status: AC
  Administered 2021-03-07: 357 mg via INTRAVENOUS
  Filled 2021-03-07: qty 17

## 2021-03-07 MED ORDER — HEPARIN SOD (PORK) LOCK FLUSH 100 UNIT/ML IV SOLN
500.0000 [IU] | Freq: Once | INTRAVENOUS | Status: AC | PRN
  Administered 2021-03-07: 500 [IU]

## 2021-03-07 MED ORDER — SODIUM CHLORIDE 0.9% FLUSH
10.0000 mL | INTRAVENOUS | Status: DC | PRN
  Administered 2021-03-07: 10 mL

## 2021-03-07 MED ORDER — MEGESTROL ACETATE 40 MG PO TABS: 40.0000 mg | ORAL_TABLET | Freq: Every day | ORAL | 1 refills | Status: DC

## 2021-03-07 MED ORDER — SODIUM CHLORIDE 0.9 % IJ SOLN: 10.0000 mL | INTRAMUSCULAR | Status: DC | PRN

## 2021-03-07 MED ORDER — VINORELBINE TARTRATE CHEMO INJECTION 50 MG/5ML
15.0000 mg/m2 | Freq: Once | INTRAVENOUS | Status: AC
  Administered 2021-03-07: 25 mg via INTRAVENOUS
  Filled 2021-03-07: qty 2.5

## 2021-03-07 MED ORDER — PROCHLORPERAZINE MALEATE 10 MG PO TABS
10.0000 mg | ORAL_TABLET | Freq: Once | ORAL | Status: AC
  Administered 2021-03-07: 10 mg via ORAL
  Filled 2021-03-07: qty 1

## 2021-03-07 MED ORDER — LIDOCAINE-PRILOCAINE 2.5-2.5 % EX CREA: 1.0000 "application " | TOPICAL_CREAM | CUTANEOUS | 2 refills | Status: DC | PRN

## 2021-03-07 MED ORDER — SODIUM CHLORIDE 0.9% FLUSH
10.0000 mL | Freq: Once | INTRAVENOUS | Status: AC
  Administered 2021-03-07: 10 mL via INTRAVENOUS

## 2021-03-07 MED ORDER — SODIUM CHLORIDE 0.9 % IV SOLN: Freq: Once | INTRAVENOUS | Status: AC

## 2021-03-07 MED ORDER — SODIUM CHLORIDE 0.9% FLUSH: 10.0000 mL | Freq: Once | INTRAVENOUS | Status: DC | PRN

## 2021-03-07 NOTE — Progress Notes (Addendum)
Media   [phone] [phone]   Clinic Follow up Note   Patient Care Team: [name], [name], [name] as PCP - General (Physician Assistant) [name], MD as Consulting Physician (Orthopedic Surgery) Pickenpack-Cousar, Carlena Sax, NP as Nurse Practitioner (Nurse Practitioner)  Date of Service:  03/07/2021  CHIEF COMPLAINT: f/u of metastatic breast cancer  CURRENT THERAPY:  Trastuzumab D1, and Vinorelbine D1 and 8 every 21 days, starting 11/01/20, changed to q14d 02/07/21  ASSESSMENT & PLAN:  [name] is a 56 y.o. female with   1. 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 She had right mastectomy, 4 cycles of AC-T and 5 years of Tamoxifen/AI. -In 06/2015 she had breast cancer recurrence metastatic to the bone.  -she has had multiple endocrine therapy and chemotherapy, overall does not tolerate treatment well and frequently required dose reduction -We switched her treatment to CMF and Keytruda on 08/02/20, and continued herceptin. Due to poor tolerance, I dropped the methotrexate and fluorouracil and delayed her third cycle to 09/20/20, so she received cytoxan, Beryle Flock, and herceptin. She tolerated much better with just some fatigue and mild headaches. -restaging PET 10/31/20 unfortunately showed significantly worsening disease. -she was switched to trastuzumab and vinorelbine on 11/01/20. She is tolerating moderately well with nausea and fatigue, dose reduced after cycle 1  -PET scan 02/04/21 showed overall stable disease with some new, small mediastinal and hilar lymph nodes. Her tumor marker is stable, plan to continue chemo  -we changed her treatment to once every 2 weeks starting 02/07/21 due to her fatigue and overall low PS -she missed her appointment two weeks ago, will proceed chemo today    2. Symptom Management: nausea and vomiting  with weight loss, depression with fatigue -with her increased bone pains, she has had more nausea and vomiting. Because of this, she has lost weight.  -she did not tolerate Boost or Ensure. We previously reviewed other options. She has gained some weight back in the last few weeks. -I also prescribed Megace several times for her. She picked this up but states she doesn't tolerate liquid medicine well. -she reports the compazine is helpful for her nausea. -she reports she is trying to get up and do more recently.   3. Body pain, secondary to bone mets   -She is being treated with Zometa q88months. -She will continue oral calcium and Vit D and Prilosec for Acid Reflux.  -She has received palliative radiation therapy to the left femur (2017), right sacroiliac area (2019), and right femur and left hip (11/28-12/14/22) under Dr. Sondra Come. -she currently has MS contin and dilaudid for pain, which she feels is more helpful than the oxycodone.   4. Goal of care discussion, DNR/DNI -We previously reviewed goal of care and I recommend DNR/DNI, she agreed again 07/19/20. -I again encouraged her to start setting up her living will. Her husband [name] Thornwood is automatically her HCPOA. She is considering changing her HCPOA to her daughter [name], whom she lives with.  -Though her care is palliative, our goal is to prolong her life. She would like to continue treatment. -she declines home palliative care as she does not want a stranger to come into her home.   5. Congenital deafness   6. Mild anemia, secondary to chemotherapy and malignancy  -her hgb decreased to 7.8 on 09/13/20.  -improved off methotrexate and fluorouraci.  -monitoring on trastuzumab/vinorelbine.  1u pRBC given 01/24/21 -hgb 7.1 today (03/07/21), we will plan for blood transfusion next week.   7. 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 Mild, only in her feet. -worsened with CMF, remains mild off treatment.     PLAN:   -proceed with C6D1  trastuzumab/vinorelbine today and continue every 2 weeks  -I called in emla cream and ambien  -2u blood transfusion to be done Monday, 2/27, she does not want today or tomorrow  -lab, f/u, and C7 in 2 weeks   No problem-specific Assessment & Plan notes found for this encounter.   SUMMARY OF ONCOLOGIC HISTORY: Oncology History Overview Note   Cancer Staging No matching staging information was found for the patient.     [name], right ([place])  11/1997 Cancer Diagnosis   Patient had 1.4 cm poorly differentiated right breast carcinoma diagnosed in Nov 1999 at age 29, 1/7 axillary nodes involved, ER/PR and HER 2 positive. She had mastectomy with the 7 axillary node evaluation, 4 cycles of adriamycin/ cytoxan followed by taxotere, then five years of tamoxifen thru June 2005 (no herceptin in 1999). She had bilateral oophorectomy in May 2005. She was briefly on aromatase inhibitor after tamoxifen, but discontinued this herself due to poor tolerance.   04/04/2015 Genetic Testing   Negative genetic testing on the breast/ovarian cancer pnael.  The Breast/Ovarian gene panel offered by [facility] includes sequencing and rearrangement analysis for the following 20 genes:  ATM, BARD1, BRCA1, BRCA2, BRIP1, CDH1, CHEK2, EPCAM, FANCC, MLH1, MSH2, MSH6, NBN, PALB2, PMS2, PTEN, RAD51C, RAD51D, TP53, and XRCC2.   06/26/2015 Pathology Results   Initial Path Report Bone, curettage, Left femur met, intramedullary subtroch tissue - METASTATIC ADENOCARCINOMA.  Estrogen Receptor: 95%, POSITIVE, STRONG STAINING INTENSITY Progesterone Receptor: 2%, POSITIVE, STRONG  HER2 (+), IHC 3+   06/26/2015 Surgery   Biopsy of metastatic tissue and stabilization with Affixus trochanteric nail, proximal and distal interlock of left femur by Dr. [name]    06/28/2015 Imaging   CT Chest w/ Contrast 1. Extensive right internal mammary chain lymphadenopathy consistent with metastatic breast cancer. 2.  Lytic metastases involving the posterior elements at T1 with probable nondisplaced pathologic fracture of the spinous process. 3. No other evidence of thoracic metastatic disease. 4. Trace bilateral pleural effusions with associated bibasilar atelectasis. 5. Indeterminate right thyroid nodule, not previously imaged. This could be evaluated with thyroid ultrasound as clinically warranted.   06/29/2015 -  Chemotherapy   Zometa started monthly on 06/29/15 and switched to q54months from 01/08/17   07/01/2015 Imaging   Bone Scan 1. Increased activity noted throughout the left femur. Although metastatic disease cannot be excluded, these changes are most likely secondary to prior surgery. 2. No other focal abnormalities identified to suggest metastatic disease.   07/12/2015 - 07/26/2015 Radiation Therapy   Left femur, 30 Gy in 10 fractions by Dr. Sondra Come    07/14/2015 Initial Diagnosis   [name], right ([place])   07/19/2015 Imaging   Initial PET Scan 1. Severe multifocal osseous metastatic disease, much greater than anticipated based on prior imaging. 2. Multifocal nodal metastases involving the right internal mammary, prevascular and subpectoral lymph nodes. No axillary pulmonary involvement identified. 3. No distant extra osseous metastases.   07/30/2015 - 11/29/2015 Chemotherapy   Docetaxel, Herceptin and Perjeta every 3 weeks X6 cycles, pt tolerated moderately well, restaging scan showed excellent response    09/18/2015 Imaging   CT Head w/wo Contrast 1. No acute intracranial abnormality or significant  interval change. 2. Stable minimal periventricular white matter hypoattenuation on the right. This may reflect a remote ischemic injury. 3. No evidence for metastatic disease to the brain. 4. No focal soft tissue lesion to explain the patient's tenderness.   10/30/2015 Imaging   CT Abdomen Pelvis w/ Contrast 1. Few mildly prominent fluid-filled loops of small bowel  scattered throughout the abdomen, with intraluminal fluid density within the distal colon. Given the provided history, findings are suggestive of acute enteritis/diarrheal illness. 2. No other acute intra-abdominal or pelvic process identified. 3. Widespread osseous metastatic disease, better evaluated on most recent PET-CT from 07/19/2015. No pathologic fracture or other complication. 4. 11 mm hypodensity within the left kidney. This lesion measures intermediate density, and is indeterminate. While this lesion is similar in size relative to recent studies, this is increased in size relative to prior study from 2012. Further evaluation with dedicated renal mass protocol CT and/or MRI is recommended for complete characterization.   12/2015 - 05/14/2016 Chemotherapy   Maintenance Herceptin and pejeta every 3 weeks stopped on 05/14/16 due to disease progression   12/25/2015 Imaging   Restaging PET Scan 1. Markedly improved skeletal activity, with only several faint foci of residual accentuated metabolic activity, but resolution of the vast majority of the previously extensive osseous metastatic disease. 2. Resolution of the prior right internal mammary and right prevascular lymph nodes. 3. Residual subcutaneous edema and edema along fascia planes in the left upper thigh. This remains somewhat more than I would expect for placement of an IM nail 6 weeks ago. If there is leg swelling further down, consider Doppler venous ultrasound to rule out left lower extremity DVT. I do not perceive an obvious difference in density between the pelvic and common femoral veins on the noncontrast CT data. 4. Chronic right maxillary and right sphenoid sinusitis.   01/16/2016 - 10/20/2017 Anti-estrogen oral therapy   Letrozole 2.5 mg daily. She was switched to exemestane due to disease progression on 02/08/17. Stopped on 10/20/2017 when treatment changed to chemo    03/01/2016 Miscellaneous   Patient presented to ED following a  fall; she reports she landed on her back and is now experiencing back pain. The patient was evaluated and discharge home same day with pain control.   05/01/2016 Imaging   CT CAP IMPRESSION: 1. Stable exam.  No new or progressive disease identified. 2. No evidence for residual or recurrent adenopathy within the chest. 3. Stable bone metastasis.   05/01/2016 Imaging   BONE SCAN IMPRESSION: 1. Small focal uptake involving the anterior rib ends of the left second and right fourth ribs, new since the prior bone scan. The location of this uptake is more suggestive of a traumatic or inflammatory etiology as opposed to metastatic disease. No other evidence of metastatic disease. 2. There are other areas of stable uptake which are most likely degenerative/reactive, including right shoulder and cervical spine uptake and uptake along the right femur adjacent to the ORIF hardware.   05/20/2016 Imaging   NM PET Skull to Thigh IMPRESSION: 1. There multiple metastatic foci in the bony pelvis, including some new abnormal foci compare to the prior PET-CT, compatible with mildly progressive bony metastatic disease. Also, a left T11 vertebral hypermetabolic metastatic lesion is observed, new compared to the prior exam. 2. No active extraosseous malignancy is currently identified. 3. Right anterior fourth rib healing fracture, appears be   06/04/2016 - 01/08/2017 Chemotherapy   Second line chemotherapy Kadcyla every 3 weeks started on 06/04/16 and stopped  on 01/08/17 due to mixed response.     10/05/2016 Imaging   CT CAP and Bone Scan:  Bones: left intramedullary rod and left femoral head neck screw in place. In the proximal diaphysis of the left femur there is cortical thickening and irregularity. No lytic or blastic bone lesions. No fracture or vertebral endplate destruction.   No definite evidence of recurrent disease in the chest, abdomen, or pelvis.   10/28/2016 Imaging   CT A/P  IMPRESSION: No acute process demonstrated in the abdomen or pelvis. No evidence of bowel obstruction or inflammation.   11/04/2016 Imaging   DG foot complete left: IMPRESSION: No fracture or dislocation.  No soft tissue abnormality   11/23/2016 Imaging   CT RIGHT FEMUR IMPRESSION: Negative CT scan of the right thigh.  No visible metastatic disease.  CT LEFT FEMUR IMPRESSION: 1. Postsurgical changes related to prior cephalomedullary rod fixation of the left femur with linear lucency along the anterolateral proximal femoral cortex at level of the lesser trochanter, suspicious for nondisplaced fracture. 2. Slightly permeative appearance of the proximal left femoral cortex at the site of known prior osseous metastases is largely unchanged. 3. Unchanged patchy lucency and sclerosis along the anterior acetabulum, which appears to correspond with an area of faint uptake on the prior PET-CT, suspicious for osseous metastasis. These results will be called to the ordering clinician or representative by the Radiologist Assistant, and communication documented in the PACS or zVision Dashboard.       01/27/2017 Imaging   IMPRESSION: 1. Mixed response to osseous metastasis. Some hypermetabolic lesions are new and increasingly hypermetabolic, while another index lesion is decreasingly hypermetabolic. 2. No evidence of hypermetabolic soft tissue metastasis.     02/08/2017 - 10/19/2017 Antibody Plan   -Herceptin every 3 week since 02/08/17 -Oral Lanpantinib $RemoveBeforeDEI'1500mg'eRbTitxjspDrcKiJ$  and decreased to $RemoveBefo'500mg'ToKsjxpSEkb$  due to diarrhea and depression. Due to disease progression she swithced to Verzenio $RemoveBef'150mg'OMjWJjbAkD$  on 05/04/17. She did not toelrate well so she was switched to Ibrance $RemoveBe'100mg'SEnmjoYsT$  on 05/31/17. Due to her neutropenia, Ibrance dose reduced to 75 mg daily, 3 weeks on, one-week off, stopped on 10/20/2017 due to disease progression.    04/30/2017 PET scan   IMPRESSION: 1. Primarily increase in hypermetabolism of osseous metastasis.  An isolated right acetabular lesion is decreased in hypermetabolism since the prior exam. 2. No evidence of hypermetabolic soft tissue metastasis. 3. Similar non FDG avid right-sided thyroid nodule, favoring a benign etiology. Recommend attention on follow-up.   08/30/2017 PET scan   08/30/2017 PET Scan  IMPRESSION: 1. Worsening osseous metastatic disease. 2. Focal hypermetabolism in the central spinal canal the L1 level, similar to the prior exam. Metastatic implant cannot be excluded. 3. Slight enlargement of a right thyroid nodule which now appears more cystic in character. Consider further evaluation with thyroid ultrasound. If patient is clinically hyperthyroid, consider nuclear medicine thyroid uptake and scan   08/30/2017 Progression   08/30/2017 PET Scan  IMPRESSION: 1. Worsening osseous metastatic disease. 2. Focal hypermetabolism in the central spinal canal the L1 level, similar to the prior exam. Metastatic implant cannot be excluded. 3. Slight enlargement of a right thyroid nodule which now appears more cystic in character. Consider further evaluation with thyroid ultrasound. If patient is clinically hyperthyroid, consider nuclear medicine thyroid uptake and scan   09/29/2017 - 10/11/2017 Radiation Therapy   Pt received 27 Gy in 9 fractions directed at the areas of significant uptake noted on recent bone scan the right pelvis and right proximal femur, with Dr,  Kinard    10/20/2017 - 04/07/2018 Chemotherapy   -Herceptin every 3 weeks starting 02/08/17, perjeta added on 10/20/2017  -weekly Taxol started on 10/21/2017. Will reduce Taxol to 2 weeks on, 1 week off starting on 02/04/17.  Stoped due to mild disease progression   10/27/2017 Imaging   10/27/2017 CT AP IMPRESSION: 1. No acute abnormality. No evidence of bowel obstruction or acute bowel inflammation. Normal appendix. 2. Stable patchy sclerotic osseous metastases throughout the visualized skeleton. No new or  progressive metastatic disease in the abdomen or pelvis.   01/11/2018 Imaging   Whole Body Bone Scan 01/11/18  IMPRESSION: 1. Multifocal osseous metastases as described. 2. Right scapular metastasis. 3. Right superior thoracic spinous process. 4. Right sacral ala and right L5 metastases. 5. Right acetabular metastasis. 6. Right proximal femur metastasis. 7. Diffuse metastases throughout the left femur. 8. Anterior right fourth rib metastasis.   01/11/2018 Imaging   CT CAP W Contrast 01/11/18  IMPRESSION: 1. Similar-appearing osseous metastatic disease. 2. No evidence for progressed metastatic disease in the chest, abdomen or pelvis.   03/30/2018 Imaging   Bone Scan 03/30/18  IMPRESSION: Stable multifocal bony metastatic disease.   03/30/2018 Imaging    CT CAP W Contrast 03/30/18 IMPRESSION: 1. New mild contiguous wall thickening with associated mucosal hyperenhancement throughout the left colon and rectum, suggesting nonspecific infectious or inflammatory proctocolitis. Differential includes C diff colitis. 2. Stable faint patchy sclerosis in the iliac bones and lumbar vertebral bodies. No new or progressive osseous metastatic disease by CT. Please note that the osseous lesions are relatively occult by CT and would be better evaluated by PET-CT, as clinically warranted. 3. No evidence of extra osseous metastatic disease.   These results will be called to the ordering clinician or representative by the Radiologist Assistant, and communication documented in the PACS or zVision Dashboard.   04/15/2018 - 06/16/2018 Chemotherapy   Enhertu q3weeks starting 04/15/18. Stopped Enhertu after cycle 3 on 05/27/18 due to poor toleration due to poor toleration with anorexia, weight loss, N&V, diarrhea   04/21/2018 PET scan   PET 04/21/18  IMPRESSION: 1. Overall marked interval improvement in bony hypermetabolic disease. All of the previously identified hypermetabolic osseous metastases have  decreased in hypermetabolism in the interval. There is a new tiny focus of hypermetabolism in the right aspect of the L3 vertebral body that appears to correspond to a new 8 mm lucency, raising concern for new metastatic disease. Close attention on follow-up recommended. 2. Focal hypermetabolism in the central spinal canal at the level of L1 previously has resolved. 3. No hypermetabolic soft tissue disease on today's study. 4. Right thyroid nodule seen previously has decreased in the interval.     06/16/2018 Imaging   CT LUMBAR SPINE WO CONTRAST 06/16/18 IMPRESSION: 1. Transitional lumbosacral anatomy. Correlation with radiographs is recommended prior to any operative intervention. 2. Suspicion of a right L3 vertebral body metastasis on the April PET-CT is not evident by CT. Stable degenerative versus metastatic lesion at the right L5 superior endplate. Small but increased right iliac bone metastasis since March. Lumbar MRI would best characterize lumbar metastatic disease (without contrast if necessary). 3. Suspect L2-L3 left subarticular segment disc herniation. Query left L2 radiculitis. 4. Stable appearance of rightward L4-L5 disc degeneration which could affect the right foramen and right lateral recess.   06/17/2018 - 11/16/2018 Chemotherapy   Restart Taxol 2 weeks on/1 week off with Herceptin and Perjeta q3weeks on 06/17/18. Perjeta was stopped after C5 due to diarrhea. Stopped Taxol  after 11/16/18 due to disease progression to L3.    07/06/2018 PET scan   PET  IMPRESSION: 1. Near total resolution of hypermetabolic activity associated with skeletal metastasis. Very mild activity associated with the LEFT iliac bone which is similar to background blood pool activity. 2. No new or progressive disease.   11/21/2018 PET scan   IMPRESSION: 1. New focal hypermetabolic lesion eccentric to the right in the L3 vertebral body, maximum SUV 6.1 compatible with a new focus of active  malignancy. 2. Very low-grade activity similar to blood pool in previous existing mildly sclerotic metastatic lesions including the left iliac bone lesion. Some of the mildly sclerotic lesions are not hypermetabolic compatible with effectively treated bony metastatic disease. 3. No hypermetabolic soft tissue lesions are identified.     11/30/2018 - 08/25/2019 Chemotherapy   Continue Herceptin q3weeks. Stopped 08/25/19 due to significant disease progression and switched to  IV Matuzumab   12/12/2018 - 12/17/2018 Chemotherapy   Tucatinib (TUKYSA) 6 tabs BID as a next line therapy starting 12/12/18. Stopped 12/17/18 duet o N&V, poor appetite and HA    12/21/2018 - 03/17/2019 Chemotherapy   IV Eribulin on day 1, 8 every 21 days starting 12/21/18. Stopped 03/17/19 due to mixed response to treatment.    03/21/2019 PET scan   IMPRESSION: 1. Mixed response to therapy of osseous metastasis. Although a right-sided L4 lesion demonstrates decreased hypermetabolism today, new left iliac and right ischial hypermetabolic lesions are seen. 2. No evidence of hypermetabolic soft tissue metastasis.     03/27/2019 - 07/21/2019 Chemotherapy   Neratinib (Nerlynx) titrate up from $Remov'120mg'ifhhKP$  to $R'240mg'zp$  starting 03/27/19. She could not tolerate $RemoveBefo'160mg'zzCyAciwUWP$  and stopped on 02/05/19 due to N&V and diarrhea. Will restart at $RemoveBe'120mg'ycSxdcAdf$  on 04/10/18-04/27/19 and 4/19-4/22. Reduced to $RemoveBe'80mg'TOaOWvhHr$  once daily on 05/10/19. Held/stopped since 07/21/19 due to dry cough, Nausea and body aches.   06/23/2019 Imaging   CT Head  IMPRESSION: 1. No acute intracranial abnormality or metastatic disease identified. 2. Chronic white matter disease in the right hemisphere is stable from prior exams.     09/05/2019 PET scan   IMPRESSION: 1. Significant progression of widespread hypermetabolic osseous metastases throughout the axial skeleton as detailed. 2. No hypermetabolic extraosseous sites of metastatic disease.   09/15/2019 - 12/22/2019 Chemotherapy   -IV  Margetuximab q3weeks and IV Gemcitabine 2 weeks on/1 week off starting 09/15/19. Carboplatin added to Gemcitabine starting with C3 on 11/03/19. D/c after C5 due to disease progression in bone.    12/13/2019 Imaging   PET  IMPRESSION: 1. Interval progression of widespread, FDG avid osseous metastasis involving the axial skeleton. 2. No FDG avid extra osseous sites of metastatic disease.     01/19/2020 -  Chemotherapy   -Xeloda BID 2 weeks on/1 week off and Herceptin q3weeks starting 01/19/20. C1 at $Rem'1000mg'AYRI$  BID. C2 increased to $RemoveBefo'1500mg'QTPvEDqXDvb$  in the AM and $Remo'1000mg'huBWs$  in the PM starting 02/12/20.   04/09/2020 Imaging   CT CAP  IMPRESSION: 1. New 3 mm RIGHT upper lobe nodule, nonspecific, attention on follow-up. 2. What may represent an incomplete periprosthetic fracture about the cephalo medullary rod in the LEFT proximal femur. This linear band of incomplete bony fusion that is more pronounced than on previous CT imaging that was acquired in 2017 and may represent a stress type fracture about hardware. Correlate with any pain in this area. Sclerotic margins show chronic features but the linear band passing through the bone around the intramedullary rod is more extensive, involving approximately 270 degrees of the  circumference of the femur, medial cortex remains intact. 3. Other findings of bony metastatic disease are similar to prior imaging. Sclerosis and some periosteal reaction about a nondisplaced pathologic fracture of the LEFT lateral seventh rib. 4. Small hypervascular focus in the RIGHT hepatic lobe, indistinct but new compared to previous imaging. Perhaps this represents a transient phenomenon though is not imaged on the later phase to allow for confirmation. Liver MRI may be helpful for further evaluation, to exclude underlying metastatic lesion as clinically warranted. 5. Hepatic steatosis. 6. Aortic atherosclerosis.   Aortic Atherosclerosis (ICD10-I70.0).     07/18/2020 Imaging    PET  IMPRESSION: No signs of visceral or nodal metastatic disease.   Increased number of hypermetabolic lesions with increased intensity of many of these areas and increased "size" of affected areas based on the regions of FDG uptake. While there are areas that do show diminished FDG uptake such as in the RIGHT scapula on balance the number and size of these areas has increased.   08/02/2020 - 10/11/2020 Chemotherapy   Patient is on Treatment Plan : BREAST Adjuvant CMF IV q21d     10/31/2020 PET scan   IMPRESSION: Interval worsening of disease with new areas of increased metabolic activity associated with LEFT hilar and infrahilar lymph nodes and on balance worsening of hypermetabolic areas within the spine, pelvis and appendicular skeleton as discussed.   Of particular note is the marked interval worsening of disease in the RIGHT proximal femur, no current visible lesion other than subtle sclerosis is noted in this area, attention to this and other substantial load bearing areas on subsequent imaging.   No additional sites of new disease or visceral involvement.   11/01/2020 -  Chemotherapy   Patient is on Treatment Plan : BREAST METASTATIC Trastuzumab D1 + Vinorelbine D1,8 (25) q21d     02/04/2021 PET scan   IMPRESSION: 1. Several new small intensely hypermetabolic mediastinal and hilar lymph nodes. Previous seen LEFT hilar lymph node is increased in metabolic activity. 2. No evidence of pulmonary metastasis. 3. No evidence of liver metastasis or adenopathy in the abdomen pelvis. 4. Evaluation of the hypermetabolic skeletal metastasis is made somewhat difficult by suspected recent GCSF therapy and robust marrow response. Clear decrease in activity in the LEFT iliac bone and sacrum and proximal RIGHT femur. Increased in metabolic activity of the thoracic spine, sternum and proximal humeri is favored related to GCSF type marrow response. Overall no evidence of skeletal metastasis  progression.      INTERVAL HISTORY:  [name] is here for a follow up of metastatic breast cancer. She was last seen by me on 02/07/21. She presents to the clinic accompanied by interpreter Juliann Pulse. I asked why she didn't come for her appointment two weeks ago. She reports she was not feeling well-- she had a few days of fever, was vomiting. She reports she is not eating much and has low energy.   All other systems were reviewed with the patient and are negative.  MEDICAL HISTORY:  Past Medical History:  Diagnosis Date   Abnormal Pap smear    Anxiety    Breast cancer ([place])    Cancer ([place]) 1999   breast-s/p mastectomy, chemo, rad   CIN I (cervical intraepithelial neoplasia I) 2003   by colpo   Congenital deafness    Deaf    Depression    History of radiation therapy    Right femur, Left hip- 12/09/20-12/25/20- Dr. Gery Pray   History of radiation  therapy    right pelvis/right femur 09/28/2017-10/11/2017  Dr Gery Pray   Port-A-Cath in place 2017   for chemotherapy   Radiation 07/12/15-07/26/15   left femur 30 Gy   S/P bilateral oophorectomy     SURGICAL HISTORY: Past Surgical History:  Procedure Laterality Date   ABDOMINAL HYSTERECTOMY     INTRAMEDULLARY (IM) NAIL INTERTROCHANTERIC Left 06/26/2015   Procedure: LEFT BIOMET LONG AFFIXS NAIL;  Surgeon: [name], MD;  Location: [facility];  Service: Orthopedics;  Laterality: Left;   IR GENERIC HISTORICAL  08/06/2015   IR US GUIDE VASC ACCESS LEFT 08/06/2015 Aletta Edouard, MD [facility]   IR GENERIC HISTORICAL  08/06/2015   IR FLUORO GUIDE CV LINE LEFT 08/06/2015 Aletta Edouard, MD [facility]   IR GENERIC HISTORICAL  03/05/2016   IR CV LINE INJECTION 03/05/2016 Markus Daft, MD [facility]   LEFT HEART CATH AND CORONARY ANGIOGRAPHY N/A 12/13/2017   Procedure: LEFT HEART CATH AND CORONARY ANGIOGRAPHY;  Surgeon: Leonie Man, MD;  Location: [facility];  Service: Cardiovascular;  Laterality: N/A;   MASTECTOMY      right breast   MASTECTOMY     OVARIAN CYST REMOVAL     RADIOLOGY WITH ANESTHESIA Left 06/25/2015   Procedure: MRI OF LEFT HIP WITH OR WITHOUT CONTRAST;  Surgeon: Medication Radiologist, MD;  Location: [facility];  Service: Radiology;  Laterality: Left;  DR. MCINTYRE/MRI   TUBAL LIGATION      I have reviewed the social history and family history with the patient and they are unchanged from previous note.  ALLERGIES:  is allergic to promethazine hcl, chlorhexidine, and diphenhydramine hcl.  MEDICATIONS:  Current Outpatient Medications  Medication Sig Dispense Refill   HYDROmorphone (DILAUDID) 2 MG tablet Take 1 tablet (2 mg total) by mouth every 6 (six) hours as needed for severe pain. 45 tablet 0   lidocaine (LIDODERM) 5 % Place 1 patch onto the skin daily. Remove & Discard patch within 12 hours or as directed by MD 30 patch 0   lidocaine-prilocaine (EMLA) cream Apply 1 application topically as needed. Apply to Porta-Cath 1-2 hours prior to access as directed. 90 g 2   LORazepam (ATIVAN) 0.5 MG tablet Take 1 tablet (0.5 mg total) by mouth every 8 (eight) hours as needed for anxiety (nausea). 20 tablet 0   megestrol (MEGACE) 40 MG tablet Take 1 tablet (40 mg total) by mouth daily. 30 tablet 1   morphine (MS CONTIN) 15 MG 12 hr tablet Take 1 tablet (15 mg total) by mouth every 12 (twelve) hours. 60 tablet 0   potassium chloride (KLOR-CON) 10 MEQ tablet Take 1 tablet (10 mEq total) by mouth 3 (three) times daily. 90 tablet 2   prochlorperazine (COMPAZINE) 10 MG tablet TAKE 1 TABLET(10 MG) BY MOUTH EVERY 6 HOURS AS NEEDED FOR NAUSEA OR VOMITING 60 tablet 1   zolpidem (AMBIEN) 10 MG tablet Take 1 tablet (10 mg total) by mouth at bedtime as needed for sleep. 30 tablet 0   dexamethasone (DECADRON) 4 MG tablet Take 1 tablet (4 mg total) by mouth daily. For 3-5 days after chemo (Patient not taking: Reported on 11/20/2020) 30 tablet 0   No current facility-administered medications for this visit.    Facility-Administered Medications Ordered in Other Visits  Medication Dose Route Frequency Provider Last Rate Last Admin   potassium chloride SA (K-DUR) CR tablet 40 mEq  40 mEq Oral Once Truitt Merle, MD       sodium  chloride 0.9 % 1,000 mL with potassium chloride 10 mEq infusion   Intravenous Continuous Livesay, Tamala Julian, MD   Stopped at 09/10/15 1653   sodium chloride 0.9 % injection 10 mL  10 mL Intravenous PRN Livesay, Lennis P, MD        PHYSICAL EXAMINATION: ECOG PERFORMANCE STATUS: 2 - Symptomatic, <50% confined to bed  Vitals:   03/07/21 1027  BP: 118/61  Pulse: 79  Resp: 18  Temp: 98.3 F (36.8 C)  SpO2: 100%   Wt Readings from Last 3 Encounters:  03/07/21 120 lb (54.4 kg)  02/07/21 127 lb 2 oz (57.7 kg)  01/24/21 123 lb 12 oz (56.1 kg)     GENERAL:alert, no distress and comfortable SKIN: skin color normal, no rashes or significant lesions EYES: normal, Conjunctiva are pink and non-injected, sclera clear  NEURO: alert & oriented x 3 with fluent speech  LABORATORY DATA:  I have reviewed the data as listed CBC Latest Ref Rng & Units 03/07/2021 02/07/2021 01/24/2021  WBC 4.0 - 10.5 K/uL 3.6(L) 11.1(H) 2.1(L)  Hemoglobin 12.0 - 15.0 g/dL 7.1(L) 8.0(L) 7.9(L)  Hematocrit 36.0 - 46.0 % 22.0(L) 24.7(L) 23.8(L)  Platelets 150 - 400 K/uL 142(L) 93(L) 165     CMP Latest Ref Rng & Units 03/07/2021 02/07/2021 01/24/2021  Glucose 70 - 99 mg/dL 95 105(H) 95  BUN 6 - 20 mg/dL _0 Creatinine 0.44 - 1.00 mg/dL 0.87 0.79 0.93  Sodium 135 - 145 mmol/L 138 140 140  Potassium 3.5 - 5.1 mmol/L 3.6 3.5 3.2(L)  Chloride 98 - 111 mmol/L 104 105 103  CO2 22 - 32 mmol/L _1 Calcium 8.9 - 10.3 mg/dL 9.3 8.9 9.4  Total Protein 6.5 - 8.1 g/dL 6.6 6.1(L) 6.7  Total Bilirubin 0.3 - 1.2 mg/dL 1.8(H) 1.8(H) 1.3(H)  Alkaline Phos 38 - 126 U/L 66 94 69  AST 15 - 41 U/L _2 ALT 0 - 44 U/L _3 RADIOGRAPHIC STUDIES: I have personally reviewed the radiological images as  listed and agreed with the findings in the report. No results found.    Orders Placed This Encounter  Procedures   Prepare RBC (crossmatch)    Standing Status:   Standing    Number of Occurrences:   1    Order Specific Question:   # of Units    Answer:   1 unit    Order Specific Question:   Transfusion Indications    Answer:   Symptomatic Anemia    Order Specific Question:   Number of Units to Keep Ahead    Answer:   NO units ahead    Order Specific Question:   If emergent release call blood bank    Answer:   [facility] [phone]   All questions were answered. The patient knows to call the clinic with any problems, questions or concerns. No barriers to learning was detected. The total time spent in the appointment was 30 minutes.     Truitt Merle, MD 03/07/2021   I, Wilburn Mylar, am acting as scribe for Truitt Merle, MD.   I have reviewed the above documentation for accuracy and completeness, and I agree with the above.

## 2021-03-07 NOTE — Progress Notes (Signed)
[facility]  [phone]) [phone] Fax:(336) [phone]   Name: BINA VEENSTRA Date: 03/07/2021 MRN: [id number]  DOB: 1966/01/10  Patient Care Team: [name], [name], [name] as PCP - General (Physician Assistant) [name], MD as Consulting Physician (Orthopedic Surgery) Pickenpack-Cousar, Carlena Sax, NP as Nurse Practitioner (Nurse Practitioner)    INTERVAL HISTORY: [name] is a 56 y.o. female with metastatic right breast (initial diagnosis 11/1997) s/p right mastectomy, chemotherapy, currently receiving vinorelbine, GERD, vertigo, congenital deafness, chemo-induced neuropathy, osteopenia, depression, anxiety, and cancer-related pain.  Palliative ask to see for symptom management and goals of care.   SOCIAL HISTORY:     reports that she has never smoked. She has never used smokeless tobacco. She reports that she does not drink alcohol and does not use drugs.  ADVANCE DIRECTIVES:  Patient does not have a completed advanced directive. She understands her husband, [name] Colton is considered HCPOA in the absence of such documents. Is aware of advanced directives clinic if she would like for [place] to make an appointment.    CODE STATUS: DNR  PAST MEDICAL HISTORY: Past Medical History:  Diagnosis Date   Abnormal Pap smear    Anxiety    Breast cancer ([place])    Cancer ([place]) 1999   breast-s/p mastectomy, chemo, rad   CIN I (cervical intraepithelial neoplasia I) 2003   by colpo   Congenital deafness    Deaf    Depression    History of radiation therapy    Right femur, Left hip- 12/09/20-12/25/20- Dr. Gery Pray   History of radiation therapy    right pelvis/right femur 09/28/2017-10/11/2017  Dr Gery Pray   Port-A-Cath in place 2017   for chemotherapy   Radiation 07/12/15-07/26/15   left femur 30 Gy   S/P bilateral oophorectomy     ALLERGIES:  is allergic to promethazine hcl, chlorhexidine, and diphenhydramine  hcl.  MEDICATIONS:  Current Outpatient Medications  Medication Sig Dispense Refill   megestrol (MEGACE) 40 MG tablet Take 1 tablet (40 mg total) by mouth daily. 30 tablet 1   dexamethasone (DECADRON) 4 MG tablet Take 1 tablet (4 mg total) by mouth daily. For 3-5 days after chemo (Patient not taking: Reported on 11/20/2020) 30 tablet 0   HYDROmorphone (DILAUDID) 2 MG tablet Take 1 tablet (2 mg total) by mouth every 6 (six) hours as needed for severe pain. 45 tablet 0   lidocaine (LIDODERM) 5 % Place 1 patch onto the skin daily. Remove & Discard patch within 12 hours or as directed by MD 30 patch 0   lidocaine-prilocaine (EMLA) cream Apply 1 application topically as needed. Apply to Porta-Cath 1-2 hours prior to access as directed. 90 g 2   LORazepam (ATIVAN) 0.5 MG tablet Take 1 tablet (0.5 mg total) by mouth every 8 (eight) hours as needed for anxiety (nausea). 20 tablet 0   morphine (MS CONTIN) 15 MG 12 hr tablet Take 1 tablet (15 mg total) by mouth every 12 (twelve) hours. 60 tablet 0   potassium chloride (KLOR-CON) 10 MEQ tablet Take 1 tablet (10 mEq total) by mouth 3 (three) times daily. 90 tablet 2   prochlorperazine (COMPAZINE) 10 MG tablet TAKE 1 TABLET(10 MG) BY MOUTH EVERY 6 HOURS AS NEEDED FOR NAUSEA OR VOMITING 60 tablet 1   zolpidem (AMBIEN) 10 MG tablet Take 1 tablet (10 mg total) by mouth at bedtime as needed for sleep. 30 tablet 0   No current facility-administered medications for this  visit.   Facility-Administered Medications Ordered in Other Visits  Medication Dose Route Frequency Provider Last Rate Last Admin   loratadine (CLARITIN) tablet 10 mg  10 mg Oral Daily Truitt Merle, MD   10 mg at 03/07/21 1119   potassium chloride SA (K-DUR) CR tablet 40 mEq  40 mEq Oral Once Truitt Merle, MD       sodium chloride 0.9 % 1,000 mL with potassium chloride 10 mEq infusion   Intravenous Continuous Livesay, Lennis P, MD   Stopped at 09/10/15 1653   sodium chloride 0.9 % injection 10 mL  10 mL  Intravenous PRN Livesay, Lennis P, MD       sodium chloride flush (NS) 0.9 % injection 10 mL  10 mL Intracatheter PRN Truitt Merle, MD   10 mL at 03/07/21 1258    VITAL SIGNS: There were no vitals taken for this visit. There were no vitals filed for this visit.  Estimated body mass index is 19.37 kg/m as calculated from the following:   Height as of 12/12/20: 5\' 6"  (1.676 m).   Weight as of an earlier encounter on 03/07/21: 120 lb (54.4 kg).   PERFORMANCE STATUS (ECOG) : 1 - Symptomatic but completely ambulatory   Physical Exam General: NAD, thin  Cardiovascular: regular rate and rhythm Pulmonary: clear ant fields Abdomen: soft, tender, + bowel sounds Extremities: no edema, no joint deformities Neurological:  AAOx3, fluent with sign-language interpretor   IMPRESSION:  I saw Mrs. Tandon during her infusion today. She appears comfortable, resting in bed. No family present, however Interpretor is at the bedside. Was seen by Dr. Burr Medico today also. No acute distress. Hemoglobin is low. Per Dr. Burr Medico will receive treatment today and return on Monday for a blood transfusion. Her daughter and granddaughter came up from [place] and spent a week with her. They are planning to leave this weekend. She shares her gratefulness in seeing her.   Pain Ongoing discussions regarding pain regimen. She is taking MS Contin ER at night and Dilaudid po for breakthrough. She endorses sleeping on and off despite use of medication but feels it is worst when she does. She is also not taking the hydromorphone during the day with fear of drowsiness. Education provided on use of medication for pain to allow her some relief. Recommended if she is planning to take a nap and is having pain this may be appropriate timing to take. I previously prescribed lidocaine patches given she does not like taking much pain medication during the day. She has not started using them expressing she was uncertain about rationale. Education  provided on use. She verbalized understanding expressing plans to begin using immediately.   Fatigue Continues to endorse fatigue. States she sleeps on and off during the day. Is able to do some activity around the home and play with her granddaughter but with frequent rest breaks.   Poor appetite/weight loss Appetite remains poor. Is attempting to eat as much as possible. Reports she will continue to push protein and eat as much as she can tolerate. Knows the importance of her nutrition. She does not tolerate boost or ensure. She was prescribed megace solution however she reports she did not do well. Would like to transition to a pill form. This will be sent to her pharmacy. Her weight continues to drop. Current weight today is 120lbs down from 127 lbs on 1/27, She was gaining prior to 1/27 at that time she was up from 123lbs on 1/13.   Nausea  [place] shares intermittent nausea and vomiting. Feels as though the compazine is effective. We discussed regimen of how she is taking compazine. She is taking the compazine twice daily. Education provided on availability to take every 6 hours as needed.   5. Insomnia Julena states she has not slept well in over a week. She did not have her Lorrin Mais available although this was called in by Dr. Burr Medico her pharmacy did not dispense. Dr. Burr Medico has sent in again today. I was able to contact pharmacy to verify receipt and that prescription would be filled. Per Pharmacist Lorrin Mais was not previously filled as insurance limits quantity to 15 pills/month and requires prior authorization. Requested information to be faxed for PA and will plan to complete today with hopes she can pick-up today.   I discussed the importance of continued conversation with family and their medical providers regarding overall plan of care and treatment options, ensuring decisions are within the context of the patients values and GOCs.  PLAN: MS Contin 30 mg QHS. She is not able to tolerate 15 mg  BID due to drowsiness. Continues to have pain. Recommended increased dosage at night to minimize drowsiness in the day and need for BID.  Lidoderm patch to left hip area. Education provided. Patient had not previously used but plans to start now that she is aware of reasoning.  Hydromorphone for breakthrough Compazine Q6H as needed for nausea.  Ambien sent in per Dr. Burr Medico. Insurance authorization is in process. If unable to obtain before end-of-the day, will contact patient and advised to pick-up plan limit of 15 tablets until we can obtain approval and prevent her from going without.  Ongoing goals of care discussions.  I will plan to see her back in 2-3 weeks.    Patient expressed understanding and was in agreement with this plan. She also understands that She can call the clinic at any time with any questions, concerns, or complaints.   Time Total: 35 min  Visit consisted of counseling and education dealing with the complex and emotionally intense issues of symptom management and palliative care in the setting of serious and potentially life-threatening illness.Greater than 50%  of this time was spent counseling and coordinating care related to the above assessment and plan.  Signed by: Alda Lea, AGPCNP-BC [facility]

## 2021-03-07 NOTE — Progress Notes (Signed)
Spoke with Lexine Baton in Blood Bank to confirm T&S and Prepare orders in Epic.  Orders were confirmed.  Blood will be transfused on Monday 03/10/2021.

## 2021-03-07 NOTE — Telephone Encounter (Signed)
I called [name]'s pharmacy to inquire about her medication prices and refills as asked by [name]. The pharmacy stated that they needed a prior authorization for her Ambien. Message sent to [place], [place]. Notified that medication was authorized and called pharmacy to verify this went through on their side. It did and [name] will be able to pick them up.

## 2021-03-07 NOTE — Progress Notes (Signed)
Per Dr. Burr Medico, "OK To Treat w/Hbg below 7.0 today"  Pt will get blood transfusion on Monday 03/10/2021

## 2021-03-07 NOTE — Patient Instructions (Signed)
[facility]  Discharge Instructions: Thank you for choosing [facility] to provide your oncology and hematology care.   If you have a lab appointment with the [facility], please go directly to the [facility] and check in at the registration area.   Wear comfortable clothing and clothing appropriate for easy access to any Portacath or PICC line.   We strive to give you quality time with your provider. You may need to reschedule your appointment if you arrive late (15 or more minutes).  Arriving late affects you and other patients whose appointments are after yours.  Also, if you miss three or more appointments without notifying the office, you may be dismissed from the clinic at the providers discretion.      For prescription refill requests, have your pharmacy contact our office and allow 72 hours for refills to be completed.    Today you received the following chemotherapy and/or immunotherapy agents : Trastuzumab, Navelbine      To help prevent nausea and vomiting after your treatment, we encourage you to take your nausea medication as directed.  BELOW ARE SYMPTOMS THAT SHOULD BE REPORTED IMMEDIATELY: *FEVER GREATER THAN 100.4 F (38 C) OR HIGHER *CHILLS OR SWEATING *NAUSEA AND VOMITING THAT IS NOT CONTROLLED WITH YOUR NAUSEA MEDICATION *UNUSUAL SHORTNESS OF BREATH *UNUSUAL BRUISING OR BLEEDING *URINARY PROBLEMS (pain or burning when urinating, or frequent urination) *BOWEL PROBLEMS (unusual diarrhea, constipation, pain near the anus) TENDERNESS IN MOUTH AND THROAT WITH OR WITHOUT PRESENCE OF ULCERS (sore throat, sores in mouth, or a toothache) UNUSUAL RASH, SWELLING OR PAIN  UNUSUAL VAGINAL DISCHARGE OR ITCHING   Items with * indicate a potential emergency and should be followed up as soon as possible or go to the Emergency Department if any problems should occur.  Please show the CHEMOTHERAPY ALERT CARD or IMMUNOTHERAPY ALERT CARD  at check-in to the Emergency Department and triage nurse.  Should you have questions after your visit or need to cancel or reschedule your appointment, please contact [facility]  Dept: [phone]  and follow the prompts.  Office hours are 8:00 a.m. to 4:30 p.m. Monday - Friday. Please note that voicemails left after 4:00 p.m. may not be returned until the following business day.  We are closed weekends and major holidays. You have access to a nurse at all times for urgent questions. Please call the main number to the clinic Dept: [phone] and follow the prompts.   For any non-urgent questions, you may also contact your provider using MyChart. We now offer e-Visits for anyone 34 and older to request care online for non-urgent symptoms. For details visit [email or web address].   Also download the MyChart app! Go to the app store, search "MyChart", open the app, select [facility], and log in with your MyChart username and password.  Due to Covid, a mask is required upon entering the hospital/clinic. If you do not have a mask, one will be given to you upon arrival. For doctor visits, patients may have 1 support person aged 18 or older with them. For treatment visits, patients cannot have anyone with them due to current Covid guidelines and our immunocompromised population.

## 2021-03-08 LAB — CANCER ANTIGEN 27.29: CA 27.29: 113.9 U/mL — ABNORMAL HIGH (ref 0.0–38.6)

## 2021-03-10 ENCOUNTER — Other Ambulatory Visit: Payer: Self-pay | Admitting: Hematology

## 2021-03-10 ENCOUNTER — Other Ambulatory Visit: Payer: Self-pay

## 2021-03-10 ENCOUNTER — Inpatient Hospital Stay: Payer: Medicaid Other

## 2021-03-10 DIAGNOSIS — C7951 Secondary malignant neoplasm of bone: Secondary | ICD-10-CM

## 2021-03-10 DIAGNOSIS — C50911 Malignant neoplasm of unspecified site of right female breast: Secondary | ICD-10-CM

## 2021-03-10 DIAGNOSIS — Z5112 Encounter for antineoplastic immunotherapy: Secondary | ICD-10-CM | POA: Diagnosis not present

## 2021-03-10 MED ORDER — SODIUM CHLORIDE 0.9% IV SOLUTION
250.0000 mL | Freq: Once | INTRAVENOUS | Status: AC
  Administered 2021-03-10: 250 mL via INTRAVENOUS

## 2021-03-10 NOTE — Patient Instructions (Signed)
Blood Transfusion, Adult, Care After This sheet gives you information about how to care for yourself after your procedure. Your doctor may also give you more specific instructions. If you have problems or questions, contact your doctor. What can I expect after the procedure? After the procedure, it is common to have: Bruising and soreness at the IV site. A headache. Follow these instructions at home: Insertion site care   Follow instructions from your doctor about how to take care of your insertion site. This is where an IV tube was put into your vein. Make sure you: Wash your hands with soap and water before and after you change your bandage (dressing). If you cannot use soap and water, use hand sanitizer. Change your bandage as told by your doctor. Check your insertion site every day for signs of infection. Check for: Redness, swelling, or pain. Bleeding from the site. Warmth. Pus or a bad smell. General instructions Take over-the-counter and prescription medicines only as told by your doctor. Rest as told by your doctor. Go back to your normal activities as told by your doctor. Keep all follow-up visits as told by your doctor. This is important. Contact a doctor if: You have itching or red, swollen areas of skin (hives). You feel worried or nervous (anxious). You feel weak after doing your normal activities. You have redness, swelling, warmth, or pain around the insertion site. You have blood coming from the insertion site, and the blood does not stop with pressure. You have pus or a bad smell coming from the insertion site. Get help right away if: You have signs of a serious reaction. This may be coming from an allergy or the body's defense system (immune system). Signs include: Trouble breathing or shortness of breath. Swelling of the face or feeling warm (flushed). Fever or chills. Head, chest, or back pain. Dark pee (urine) or blood in the pee. Widespread rash. Fast  heartbeat. Feeling dizzy or light-headed. You may receive your blood transfusion in an outpatient setting. If so, you will be told whom to contact to report any reactions. These symptoms may be an emergency. Do not wait to see if the symptoms will go away. Get medical help right away. Call your local emergency services (911 in the [place] Do not drive yourself to the hospital. Summary Bruising and soreness at the IV site are common. Check your insertion site every day for signs of infection. Rest as told by your doctor. Go back to your normal activities as told by your doctor. Get help right away if you have signs of a serious reaction. This information is not intended to replace advice given to you by your health care provider. Make sure you discuss any questions you have with your health care provider. Document Revised: 04/25/2020 Document Reviewed: 06/23/2018 Elsevier Patient Education  2022 [employer]  

## 2021-03-11 LAB — TYPE AND SCREEN
ABO/RH(D): O POS
Antibody Screen: NEGATIVE
Unit division: 0
Unit division: 0

## 2021-03-11 LAB — BPAM RBC
Blood Product Expiration Date: 202303232359
Blood Product Expiration Date: 202303282359
ISSUE DATE / TIME: 202302270852
ISSUE DATE / TIME: 202302270852
Unit Type and Rh: 5100
Unit Type and Rh: 5100

## 2021-03-21 ENCOUNTER — Other Ambulatory Visit: Payer: Self-pay

## 2021-03-21 ENCOUNTER — Inpatient Hospital Stay: Payer: Medicaid Other | Attending: Hematology

## 2021-03-21 ENCOUNTER — Inpatient Hospital Stay: Payer: Medicaid Other

## 2021-03-21 ENCOUNTER — Inpatient Hospital Stay (HOSPITAL_BASED_OUTPATIENT_CLINIC_OR_DEPARTMENT_OTHER): Payer: Medicaid Other | Admitting: Hematology

## 2021-03-21 VITALS — BP 131/70 | HR 74 | Temp 98.2°F | Resp 16 | Wt 118.0 lb

## 2021-03-21 DIAGNOSIS — C7951 Secondary malignant neoplasm of bone: Secondary | ICD-10-CM | POA: Diagnosis not present

## 2021-03-21 DIAGNOSIS — Z5111 Encounter for antineoplastic chemotherapy: Secondary | ICD-10-CM | POA: Insufficient documentation

## 2021-03-21 DIAGNOSIS — D6481 Anemia due to antineoplastic chemotherapy: Secondary | ICD-10-CM | POA: Insufficient documentation

## 2021-03-21 DIAGNOSIS — Z95828 Presence of other vascular implants and grafts: Secondary | ICD-10-CM

## 2021-03-21 DIAGNOSIS — H905 Unspecified sensorineural hearing loss: Secondary | ICD-10-CM | POA: Diagnosis not present

## 2021-03-21 DIAGNOSIS — G620 Drug-induced polyneuropathy: Secondary | ICD-10-CM | POA: Diagnosis not present

## 2021-03-21 DIAGNOSIS — C50911 Malignant neoplasm of unspecified site of right female breast: Secondary | ICD-10-CM

## 2021-03-21 DIAGNOSIS — G893 Neoplasm related pain (acute) (chronic): Secondary | ICD-10-CM

## 2021-03-21 DIAGNOSIS — Z170 Estrogen receptor positive status [ER+]: Secondary | ICD-10-CM | POA: Insufficient documentation

## 2021-03-21 DIAGNOSIS — Z5112 Encounter for antineoplastic immunotherapy: Secondary | ICD-10-CM | POA: Insufficient documentation

## 2021-03-21 LAB — CMP (CANCER CENTER ONLY)
ALT: 9 U/L (ref 0–44)
AST: 21 U/L (ref 15–41)
Albumin: 3.8 g/dL (ref 3.5–5.0)
Alkaline Phosphatase: 71 U/L (ref 38–126)
Anion gap: 8 (ref 5–15)
BUN: 9 mg/dL (ref 6–20)
CO2: 28 mmol/L (ref 22–32)
Calcium: 9.7 mg/dL (ref 8.9–10.3)
Chloride: 105 mmol/L (ref 98–111)
Creatinine: 0.84 mg/dL (ref 0.44–1.00)
GFR, Estimated: >60 mL/min (ref >60)
Glucose, Bld: 137 mg/dL — ABNORMAL HIGH (ref 70–99)
Potassium: 3.1 mmol/L — ABNORMAL LOW (ref 3.5–5.1)
Sodium: 141 mmol/L (ref 135–145)
Total Bilirubin: 1.8 mg/dL — ABNORMAL HIGH (ref 0.3–1.2)
Total Protein: 6.3 g/dL — ABNORMAL LOW (ref 6.5–8.1)

## 2021-03-21 LAB — CBC WITH DIFFERENTIAL (CANCER CENTER ONLY)
Abs Immature Granulocytes: 0.04 10*3/uL (ref 0.00–0.07)
Basophils Absolute: 0 10*3/uL (ref 0.0–0.1)
Basophils Relative: 1 %
Eosinophils Absolute: 0.1 10*3/uL (ref 0.0–0.5)
Eosinophils Relative: 2 %
HCT: 27.3 % — ABNORMAL LOW (ref 36.0–46.0)
Hemoglobin: 9 g/dL — ABNORMAL LOW (ref 12.0–15.0)
Immature Granulocytes: 2 %
Lymphocytes Relative: 19 %
Lymphs Abs: 0.4 10*3/uL — ABNORMAL LOW (ref 0.7–4.0)
MCH: 29 pg (ref 26.0–34.0)
MCHC: 33 g/dL (ref 30.0–36.0)
MCV: 88.1 fL (ref 80.0–100.0)
Monocytes Absolute: 0.3 10*3/uL (ref 0.1–1.0)
Monocytes Relative: 12 %
Neutro Abs: 1.5 10*3/uL — ABNORMAL LOW (ref 1.7–7.7)
Neutrophils Relative %: 64 %
Platelet Count: 131 10*3/uL — ABNORMAL LOW (ref 150–400)
RBC: 3.1 MIL/uL — ABNORMAL LOW (ref 3.87–5.11)
RDW: 16.4 % — ABNORMAL HIGH (ref 11.5–15.5)
WBC Count: 2.4 10*3/uL — ABNORMAL LOW (ref 4.0–10.5)
nRBC: 0 % (ref 0.0–0.2)

## 2021-03-21 MED ORDER — POTASSIUM CHLORIDE CRYS ER 20 MEQ PO TBCR
20.0000 meq | EXTENDED_RELEASE_TABLET | Freq: Once | ORAL | Status: AC
  Administered 2021-03-21: 10 meq via ORAL
  Filled 2021-03-21: qty 1

## 2021-03-21 MED ORDER — HEPARIN SOD (PORK) LOCK FLUSH 100 UNIT/ML IV SOLN
500.0000 [IU] | Freq: Once | INTRAVENOUS | Status: AC | PRN
  Administered 2021-03-21: 500 [IU]

## 2021-03-21 MED ORDER — PROCHLORPERAZINE MALEATE 10 MG PO TABS
10.0000 mg | ORAL_TABLET | Freq: Once | ORAL | Status: AC
  Administered 2021-03-21: 10 mg via ORAL
  Filled 2021-03-21: qty 1

## 2021-03-21 MED ORDER — SODIUM CHLORIDE 0.9 % IV SOLN: Freq: Once | INTRAVENOUS | Status: AC

## 2021-03-21 MED ORDER — TRASTUZUMAB-DKST CHEMO 150 MG IV SOLR
4.0000 mg/kg | Freq: Once | INTRAVENOUS | Status: AC
  Administered 2021-03-21: 231 mg via INTRAVENOUS
  Filled 2021-03-21: qty 11

## 2021-03-21 MED ORDER — VINORELBINE TARTRATE CHEMO INJECTION 50 MG/5ML
15.0000 mg/m2 | Freq: Once | INTRAVENOUS | Status: AC
  Administered 2021-03-21: 25 mg via INTRAVENOUS
  Filled 2021-03-21: qty 2.5

## 2021-03-21 MED ORDER — LORATADINE 10 MG PO TABS
10.0000 mg | ORAL_TABLET | Freq: Every day | ORAL | Status: DC
  Administered 2021-03-21: 10 mg via ORAL
  Filled 2021-03-21: qty 1

## 2021-03-21 MED ORDER — SODIUM CHLORIDE 0.9% FLUSH
10.0000 mL | INTRAVENOUS | Status: DC | PRN
  Administered 2021-03-21: 10 mL

## 2021-03-21 MED ORDER — ACETAMINOPHEN 325 MG PO TABS
650.0000 mg | ORAL_TABLET | Freq: Once | ORAL | Status: AC
  Administered 2021-03-21: 650 mg via ORAL
  Filled 2021-03-21: qty 2

## 2021-03-21 MED ORDER — SODIUM CHLORIDE 0.9% FLUSH
10.0000 mL | Freq: Once | INTRAVENOUS | Status: AC
  Administered 2021-03-21: 10 mL

## 2021-03-21 NOTE — Progress Notes (Signed)
Ok to give pre meds and treat with total bili-rubin of 1.8 mg/dL per Dr. Burr Medico ? ?Orders to give KCl 27mq PO for potassium of 3.1 mmol/L today ?

## 2021-03-21 NOTE — Progress Notes (Addendum)
[place]   [phone] Fax:([phone]   Clinic Follow up Note   Patient Care Team: [name], [name], [name] as PCP - General (Physician Assistant) [name], MD as Consulting Physician (Orthopedic Surgery) Pickenpack-Cousar, Carlena Sax, NP as Nurse Practitioner (Nurse Practitioner)  Date of Service:  03/21/2021  CHIEF COMPLAINT: f/u of metastatic breast cancer  CURRENT THERAPY:  Trastuzumab D1, and Vinorelbine D1 and 8 every 21 days, starting 11/01/20, changed to q14d 02/07/21  ASSESSMENT & PLAN:  [name] is a 56 y.o. female with   1. 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 She had right mastectomy, 4 cycles of AC-T and 5 years of Tamoxifen/AI. -In 06/2015 she had breast cancer recurrence metastatic to the bone.  -she has had multiple endocrine therapy and chemotherapy, overall does not tolerate treatment well and frequently required dose reduction -We switched her treatment to CMF and Keytruda on 08/02/20, and continued herceptin. Due to poor tolerance, I dropped the methotrexate and fluorouracil and delayed her third cycle to 09/20/20, so she received cytoxan, Beryle Flock, and herceptin. She tolerated much better with just some fatigue and mild headaches. -restaging PET 10/31/20 unfortunately showed significantly worsening disease. -she was switched to trastuzumab and vinorelbine on 11/01/20. She is tolerating moderately well with nausea and fatigue, dose reduced after cycle 1  -PET scan 02/04/21 showed overall stable disease with some new, small mediastinal and hilar lymph nodes. Her tumor marker is stable, plan to continue chemo  -we changed her treatment to once every 2 weeks starting 02/07/21 due to her fatigue and overall low PS -she continues to tolerate treatment overall well. She states she is able to do things around the house, isn't eating  too well but drinking plenty of liquids. Labs reviewed, potassium is 3.1. We will give her oral potassium. Otherwise adequate to proceed with treatment.   2. Symptom Management: nausea and vomiting with weight loss, depression with fatigue -with her increased bone pains, she has had more nausea and vomiting. Because of this, she has lost weight.  -she did not tolerate Boost or Ensure. We previously reviewed other options.  -she reports the compazine is helpful for her nausea. -she reports she is trying to get up and do more recently.   3. Body pain, secondary to bone mets   -She is being treated with Zometa q75month. -She will continue oral calcium and Vit D and Prilosec for Acid Reflux.  -She has received palliative radiation therapy to the left femur (2017), right sacroiliac area (2019), and right femur and left hip (11/28-12/14/22) under Dr. [name] -she currently has MS contin and dilaudid for pain, which she feels is more helpful than the oxycodone.   4. Goal of care discussion, DNR/DNI -We previously reviewed goal of care and I recommend DNR/DNI, she agreed again 07/19/20. -I again encouraged her to start setting up her living will. Her husband [name] automatically her HCPOA.  -Though her care is palliative, our goal is to prolong her life. She would like to continue treatment. -she declines home palliative care as she does not want a stranger to come into her home.   5. Congenital deafness   6. Mild anemia, secondary to chemotherapy and malignancy  -her hgb decreased to 7.8 on 09/13/20.  -improved off methotrexate and fluorouraci.  -monitoring on trastuzumab/vinorelbine. 1u pRBC given 01/24/21 -hgb 9 today (03/21/21), no need for blood  transfusion.   7. 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 Mild, only in her feet. -worsened with CMF, remains mild off treatment.     PLAN:   -proceed with C7 trastuzumab/vinorelbine today and continue every 2 weeks  -lab, f/u with NP Nikki in 2 weeks  and with me in 4 weeks, plan to order restaging scan on next visit    No problem-specific Assessment & Plan notes found for this encounter.   SUMMARY OF ONCOLOGIC HISTORY: Oncology History Overview Note   Cancer Staging No matching staging information was found for the patient.     [name], right ([place])  11/1997 Cancer Diagnosis   Patient had 1.4 cm poorly differentiated right breast carcinoma diagnosed in Nov 1999 at age 57, 1/7 axillary nodes involved, ER/PR and HER 2 positive. She had mastectomy with the 7 axillary node evaluation, 4 cycles of adriamycin/ cytoxan followed by taxotere, then five years of tamoxifen thru June 2005 (no herceptin in 1999). She had bilateral oophorectomy in May 2005. She was briefly on aromatase inhibitor after tamoxifen, but discontinued this herself due to poor tolerance.   04/04/2015 Genetic Testing   Negative genetic testing on the breast/ovarian cancer pnael.  The Breast/Ovarian gene panel offered by [facility] includes sequencing and rearrangement analysis for the following 20 genes:  ATM, BARD1, BRCA1, BRCA2, BRIP1, CDH1, CHEK2, EPCAM, FANCC, MLH1, MSH2, MSH6, NBN, PALB2, PMS2, PTEN, RAD51C, RAD51D, TP53, and XRCC2.   06/26/2015 Pathology Results   Initial Path Report Bone, curettage, Left femur met, intramedullary subtroch tissue - METASTATIC ADENOCARCINOMA.  Estrogen Receptor: 95%, POSITIVE, STRONG STAINING INTENSITY Progesterone Receptor: 2%, POSITIVE, STRONG  HER2 (+), IHC 3+   06/26/2015 Surgery   Biopsy of metastatic tissue and stabilization with Affixus trochanteric nail, proximal and distal interlock of left femur by Dr. [name]    06/28/2015 Imaging   CT Chest w/ Contrast 1. Extensive right internal mammary chain lymphadenopathy consistent with metastatic breast cancer. 2. Lytic metastases involving the posterior elements at T1 with probable nondisplaced pathologic fracture of the spinous process. 3. No other  evidence of thoracic metastatic disease. 4. Trace bilateral pleural effusions with associated bibasilar atelectasis. 5. Indeterminate right thyroid nodule, not previously imaged. This could be evaluated with thyroid ultrasound as clinically warranted.   06/29/2015 -  Chemotherapy   Zometa started monthly on 06/29/15 and switched to q102month from 01/08/17   07/01/2015 Imaging   Bone Scan 1. Increased activity noted throughout the left femur. Although metastatic disease cannot be excluded, these changes are most likely secondary to prior surgery. 2. No other focal abnormalities identified to suggest metastatic disease.   07/12/2015 - 07/26/2015 Radiation Therapy   Left femur, 30 Gy in 10 fractions by Dr. [name]   07/14/2015 Initial Diagnosis   [name], right [place]   07/19/2015 Imaging   Initial PET Scan 1. Severe multifocal osseous metastatic disease, much greater than anticipated based on prior imaging. 2. Multifocal nodal metastases involving the right internal mammary, prevascular and subpectoral lymph nodes. No axillary pulmonary involvement identified. 3. No distant extra osseous metastases.   07/30/2015 - 11/29/2015 Chemotherapy   Docetaxel, Herceptin and Perjeta every 3 weeks X6 cycles, pt tolerated moderately well, restaging scan showed excellent response    09/18/2015 Imaging   CT Head w/wo Contrast 1. No acute intracranial abnormality or significant interval change. 2. Stable minimal periventricular white matter hypoattenuation on the right. This may reflect a remote ischemic injury. 3. No evidence for  metastatic disease to the brain. 4. No focal soft tissue lesion to explain the patient's tenderness.   10/30/2015 Imaging   CT Abdomen Pelvis w/ Contrast 1. Few mildly prominent fluid-filled loops of small bowel scattered throughout the abdomen, with intraluminal fluid density within the distal colon. Given the provided history, findings are suggestive  of acute enteritis/diarrheal illness. 2. No other acute intra-abdominal or pelvic process identified. 3. Widespread osseous metastatic disease, better evaluated on most recent PET-CT from 07/19/2015. No pathologic fracture or other complication. 4. 11 mm hypodensity within the left kidney. This lesion measures intermediate density, and is indeterminate. While this lesion is similar in size relative to recent studies, this is increased in size relative to prior study from 2012. Further evaluation with dedicated renal mass protocol CT and/or MRI is recommended for complete characterization.   12/2015 - 05/14/2016 Chemotherapy   Maintenance Herceptin and pejeta every 3 weeks stopped on 05/14/16 due to disease progression   12/25/2015 Imaging   Restaging PET Scan 1. Markedly improved skeletal activity, with only several faint foci of residual accentuated metabolic activity, but resolution of the vast majority of the previously extensive osseous metastatic disease. 2. Resolution of the prior right internal mammary and right prevascular lymph nodes. 3. Residual subcutaneous edema and edema along fascia planes in the left upper thigh. This remains somewhat more than I would expect for placement of an IM nail 6 weeks ago. If there is leg swelling further down, consider Doppler venous ultrasound to rule out left lower extremity DVT. I do not perceive an obvious difference in density between the pelvic and common femoral veins on the noncontrast CT data. 4. Chronic right maxillary and right sphenoid sinusitis.   01/16/2016 - 10/20/2017 Anti-estrogen oral therapy   Letrozole 2.5 mg daily. She was switched to exemestane due to disease progression on 02/08/17. Stopped on 10/20/2017 when treatment changed to chemo    03/01/2016 Miscellaneous   Patient presented to ED following a fall; she reports she landed on her back and is now experiencing back pain. The patient was evaluated and discharge home same day with pain  control.   05/01/2016 Imaging   CT CAP IMPRESSION: 1. Stable exam.  No new or progressive disease identified. 2. No evidence for residual or recurrent adenopathy within the chest. 3. Stable bone metastasis.   05/01/2016 Imaging   BONE SCAN IMPRESSION: 1. Small focal uptake involving the anterior rib ends of the left second and right fourth ribs, new since the prior bone scan. The location of this uptake is more suggestive of a traumatic or inflammatory etiology as opposed to metastatic disease. No other evidence of metastatic disease. 2. There are other areas of stable uptake which are most likely degenerative/reactive, including right shoulder and cervical spine uptake and uptake along the right femur adjacent to the ORIF hardware.   05/20/2016 Imaging   NM PET Skull to Thigh IMPRESSION: 1. There multiple metastatic foci in the bony pelvis, including some new abnormal foci compare to the prior PET-CT, compatible with mildly progressive bony metastatic disease. Also, a left T11 vertebral hypermetabolic metastatic lesion is observed, new compared to the prior exam. 2. No active extraosseous malignancy is currently identified. 3. Right anterior fourth rib healing fracture, appears be   06/04/2016 - 01/08/2017 Chemotherapy   Second line chemotherapy Kadcyla every 3 weeks started on 06/04/16 and stopped on 01/08/17 due to mixed response.     10/05/2016 Imaging   CT CAP and Bone Scan:  Bones: left intramedullary  rod and left femoral head neck screw in place. In the proximal diaphysis of the left femur there is cortical thickening and irregularity. No lytic or blastic bone lesions. No fracture or vertebral endplate destruction.   No definite evidence of recurrent disease in the chest, abdomen, or pelvis.   10/28/2016 Imaging   CT A/P IMPRESSION: No acute process demonstrated in the abdomen or pelvis. No evidence of bowel obstruction or inflammation.   11/04/2016 Imaging   DG  foot complete left: IMPRESSION: No fracture or dislocation.  No soft tissue abnormality   11/23/2016 Imaging   CT RIGHT FEMUR IMPRESSION: Negative CT scan of the right thigh.  No visible metastatic disease.  CT LEFT FEMUR IMPRESSION: 1. Postsurgical changes related to prior cephalomedullary rod fixation of the left femur with linear lucency along the anterolateral proximal femoral cortex at level of the lesser trochanter, suspicious for nondisplaced fracture. 2. Slightly permeative appearance of the proximal left femoral cortex at the site of known prior osseous metastases is largely unchanged. 3. Unchanged patchy lucency and sclerosis along the anterior acetabulum, which appears to correspond with an area of faint uptake on the prior PET-CT, suspicious for osseous metastasis. These results will be called to the ordering clinician or representative by the Radiologist Assistant, and communication documented in the PACS or zVision Dashboard.       01/27/2017 Imaging   IMPRESSION: 1. Mixed response to osseous metastasis. Some hypermetabolic lesions are new and increasingly hypermetabolic, while another index lesion is decreasingly hypermetabolic. 2. No evidence of hypermetabolic soft tissue metastasis.     02/08/2017 - 10/19/2017 Antibody Plan   -Herceptin every 3 week since 02/08/17 -Oral Lanpantinib 1597m and decreased to 5091mdue to diarrhea and depression. Due to disease progression she swithced to Verzenio 15038mn 05/04/17. She did not toelrate well so she was switched to Ibrance 100m77m 05/31/17. Due to her neutropenia, Ibrance dose reduced to 75 mg daily, 3 weeks on, one-week off, stopped on 10/20/2017 due to disease progression.    04/30/2017 PET scan   IMPRESSION: 1. Primarily increase in hypermetabolism of osseous metastasis. An isolated right acetabular lesion is decreased in hypermetabolism since the prior exam. 2. No evidence of hypermetabolic soft tissue  metastasis. 3. Similar non FDG avid right-sided thyroid nodule, favoring a benign etiology. Recommend attention on follow-up.   08/30/2017 PET scan   08/30/2017 PET Scan  IMPRESSION: 1. Worsening osseous metastatic disease. 2. Focal hypermetabolism in the central spinal canal the L1 level, similar to the prior exam. Metastatic implant cannot be excluded. 3. Slight enlargement of a right thyroid nodule which now appears more cystic in character. Consider further evaluation with thyroid ultrasound. If patient is clinically hyperthyroid, consider nuclear medicine thyroid uptake and scan   08/30/2017 Progression   08/30/2017 PET Scan  IMPRESSION: 1. Worsening osseous metastatic disease. 2. Focal hypermetabolism in the central spinal canal the L1 level, similar to the prior exam. Metastatic implant cannot be excluded. 3. Slight enlargement of a right thyroid nodule which now appears more cystic in character. Consider further evaluation with thyroid ultrasound. If patient is clinically hyperthyroid, consider nuclear medicine thyroid uptake and scan   09/29/2017 - 10/11/2017 Radiation Therapy   Pt received 27 Gy in 9 fractions directed at the areas of significant uptake noted on recent bone scan the right pelvis and right proximal femur, with Dr, KinaSondra Come10/09/2017 - 04/07/2018 Chemotherapy   -Herceptin every 3 weeks starting 02/08/17, perjeta added on 10/20/2017  -weekly Taxol  started on 10/21/2017. Will reduce Taxol to 2 weeks on, 1 week off starting on 02/04/17.  Stoped due to mild disease progression   10/27/2017 Imaging   10/27/2017 CT AP IMPRESSION: 1. No acute abnormality. No evidence of bowel obstruction or acute bowel inflammation. Normal appendix. 2. Stable patchy sclerotic osseous metastases throughout the visualized skeleton. No new or progressive metastatic disease in the abdomen or pelvis.   01/11/2018 Imaging   Whole Body Bone Scan 01/11/18  IMPRESSION: 1. Multifocal  osseous metastases as described. 2. Right scapular metastasis. 3. Right superior thoracic spinous process. 4. Right sacral ala and right L5 metastases. 5. Right acetabular metastasis. 6. Right proximal femur metastasis. 7. Diffuse metastases throughout the left femur. 8. Anterior right fourth rib metastasis.   01/11/2018 Imaging   CT CAP W Contrast 01/11/18  IMPRESSION: 1. Similar-appearing osseous metastatic disease. 2. No evidence for progressed metastatic disease in the chest, abdomen or pelvis.   03/30/2018 Imaging   Bone Scan 03/30/18  IMPRESSION: Stable multifocal bony metastatic disease.   03/30/2018 Imaging    CT CAP W Contrast 03/30/18 IMPRESSION: 1. New mild contiguous wall thickening with associated mucosal hyperenhancement throughout the left colon and rectum, suggesting nonspecific infectious or inflammatory proctocolitis. Differential includes C diff colitis. 2. Stable faint patchy sclerosis in the iliac bones and lumbar vertebral bodies. No new or progressive osseous metastatic disease by CT. Please note that the osseous lesions are relatively occult by CT and would be better evaluated by PET-CT, as clinically warranted. 3. No evidence of extra osseous metastatic disease.   These results will be called to the ordering clinician or representative by the Radiologist Assistant, and communication documented in the PACS or zVision Dashboard.   04/15/2018 - 06/16/2018 Chemotherapy   Enhertu q3weeks starting 04/15/18. Stopped Enhertu after cycle 3 on 05/27/18 due to poor toleration due to poor toleration with anorexia, weight loss, N&V, diarrhea   04/21/2018 PET scan   PET 04/21/18  IMPRESSION: 1. Overall marked interval improvement in bony hypermetabolic disease. All of the previously identified hypermetabolic osseous metastases have decreased in hypermetabolism in the interval. There is a new tiny focus of hypermetabolism in the right aspect of the L3 vertebral body that  appears to correspond to a new 8 mm lucency, raising concern for new metastatic disease. Close attention on follow-up recommended. 2. Focal hypermetabolism in the central spinal canal at the level of L1 previously has resolved. 3. No hypermetabolic soft tissue disease on today's study. 4. Right thyroid nodule seen previously has decreased in the interval.     06/16/2018 Imaging   CT LUMBAR SPINE WO CONTRAST 06/16/18 IMPRESSION: 1. Transitional lumbosacral anatomy. Correlation with radiographs is recommended prior to any operative intervention. 2. Suspicion of a right L3 vertebral body metastasis on the April PET-CT is not evident by CT. Stable degenerative versus metastatic lesion at the right L5 superior endplate. Small but increased right iliac bone metastasis since March. Lumbar MRI would best characterize lumbar metastatic disease (without contrast if necessary). 3. Suspect L2-L3 left subarticular segment disc herniation. Query left L2 radiculitis. 4. Stable appearance of rightward L4-L5 disc degeneration which could affect the right foramen and right lateral recess.   06/17/2018 - 11/16/2018 Chemotherapy   Restart Taxol 2 weeks on/1 week off with Herceptin and Perjeta q3weeks on 06/17/18. Perjeta was stopped after C5 due to diarrhea. Stopped Taxol after 11/16/18 due to disease progression to L3.    07/06/2018 PET scan   PET  IMPRESSION: 1. Near total resolution  of hypermetabolic activity associated with skeletal metastasis. Very mild activity associated with the LEFT iliac bone which is similar to background blood pool activity. 2. No new or progressive disease.   11/21/2018 PET scan   IMPRESSION: 1. New focal hypermetabolic lesion eccentric to the right in the L3 vertebral body, maximum SUV 6.1 compatible with a new focus of active malignancy. 2. Very low-grade activity similar to blood pool in previous existing mildly sclerotic metastatic lesions including the left iliac bone  lesion. Some of the mildly sclerotic lesions are not hypermetabolic compatible with effectively treated bony metastatic disease. 3. No hypermetabolic soft tissue lesions are identified.     11/30/2018 - 08/25/2019 Chemotherapy   Continue Herceptin q3weeks. Stopped 08/25/19 due to significant disease progression and switched to  IV Matuzumab   12/12/2018 - 12/17/2018 Chemotherapy   Tucatinib (TUKYSA) 6 tabs BID as a next line therapy starting 12/12/18. Stopped 12/17/18 duet o N&V, poor appetite and HA    12/21/2018 - 03/17/2019 Chemotherapy   IV Eribulin on day 1, 8 every 21 days starting 12/21/18. Stopped 03/17/19 due to mixed response to treatment.    03/21/2019 PET scan   IMPRESSION: 1. Mixed response to therapy of osseous metastasis. Although a right-sided L4 lesion demonstrates decreased hypermetabolism today, new left iliac and right ischial hypermetabolic lesions are seen. 2. No evidence of hypermetabolic soft tissue metastasis.     03/27/2019 - 07/21/2019 Chemotherapy   Neratinib (Nerlynx) titrate up from 153m to 2451mstarting 03/27/19. She could not tolerate 16029mnd stopped on 02/05/19 due to N&V and diarrhea. Will restart at 120m6m 04/10/18-04/27/19 and 4/19-4/22. Reduced to 80mg72me daily on 05/10/19. Held/stopped since 07/21/19 due to dry cough, Nausea and body aches.   06/23/2019 Imaging   CT Head  IMPRESSION: 1. No acute intracranial abnormality or metastatic disease identified. 2. Chronic white matter disease in the right hemisphere is stable from prior exams.     09/05/2019 PET scan   IMPRESSION: 1. Significant progression of widespread hypermetabolic osseous metastases throughout the axial skeleton as detailed. 2. No hypermetabolic extraosseous sites of metastatic disease.   09/15/2019 - 12/22/2019 Chemotherapy   -IV Margetuximab q3weeks and IV Gemcitabine 2 weeks on/1 week off starting 09/15/19. Carboplatin added to Gemcitabine starting with C3 on 11/03/19. D/c after C5 due to  disease progression in bone.    12/13/2019 Imaging   PET  IMPRESSION: 1. Interval progression of widespread, FDG avid osseous metastasis involving the axial skeleton. 2. No FDG avid extra osseous sites of metastatic disease.     01/19/2020 -  Chemotherapy   -Xeloda BID 2 weeks on/1 week off and Herceptin q3weeks starting 01/19/20. C1 at 1000mg 61m C2 increased to 1500mg i50me AM and 1000mg in63m PM starting 02/12/20.   04/09/2020 Imaging   CT CAP  IMPRESSION: 1. New 3 mm RIGHT upper lobe nodule, nonspecific, attention on follow-up. 2. What may represent an incomplete periprosthetic fracture about the cephalo medullary rod in the LEFT proximal femur. This linear band of incomplete bony fusion that is more pronounced than on previous CT imaging that was acquired in 2017 and may represent a stress type fracture about hardware. Correlate with any pain in this area. Sclerotic margins show chronic features but the linear band passing through the bone around the intramedullary rod is more extensive, involving approximately 270 degrees of the circumference of the femur, medial cortex remains intact. 3. Other findings of bony metastatic disease are similar to prior imaging. Sclerosis and some  periosteal reaction about a nondisplaced pathologic fracture of the LEFT lateral seventh rib. 4. Small hypervascular focus in the RIGHT hepatic lobe, indistinct but new compared to previous imaging. Perhaps this represents a transient phenomenon though is not imaged on the later phase to allow for confirmation. Liver MRI may be helpful for further evaluation, to exclude underlying metastatic lesion as clinically warranted. 5. Hepatic steatosis. 6. Aortic atherosclerosis.   Aortic Atherosclerosis (ICD10-I70.0).     07/18/2020 Imaging   PET  IMPRESSION: No signs of visceral or nodal metastatic disease.   Increased number of hypermetabolic lesions with increased intensity of many of these areas and  increased "size" of affected areas based on the regions of FDG uptake. While there are areas that do show diminished FDG uptake such as in the RIGHT scapula on balance the number and size of these areas has increased.   08/02/2020 - 10/11/2020 Chemotherapy   Patient is on Treatment Plan : BREAST Adjuvant CMF IV q21d     10/31/2020 PET scan   IMPRESSION: Interval worsening of disease with new areas of increased metabolic activity associated with LEFT hilar and infrahilar lymph nodes and on balance worsening of hypermetabolic areas within the spine, pelvis and appendicular skeleton as discussed.   Of particular note is the marked interval worsening of disease in the RIGHT proximal femur, no current visible lesion other than subtle sclerosis is noted in this area, attention to this and other substantial load bearing areas on subsequent imaging.   No additional sites of new disease or visceral involvement.   11/01/2020 -  Chemotherapy   Patient is on Treatment Plan : BREAST METASTATIC Trastuzumab D1 + Vinorelbine D1,8 (25) q21d     02/04/2021 PET scan   IMPRESSION: 1. Several new small intensely hypermetabolic mediastinal and hilar lymph nodes. Previous seen LEFT hilar lymph node is increased in metabolic activity. 2. No evidence of pulmonary metastasis. 3. No evidence of liver metastasis or adenopathy in the abdomen pelvis. 4. Evaluation of the hypermetabolic skeletal metastasis is made somewhat difficult by suspected recent GCSF therapy and robust marrow response. Clear decrease in activity in the LEFT iliac bone and sacrum and proximal RIGHT femur. Increased in metabolic activity of the thoracic spine, sternum and proximal humeri is favored related to GCSF type marrow response. Overall no evidence of skeletal metastasis progression.      INTERVAL HISTORY:  [name] is here for a follow up of metastatic breast cancer. She was last seen by me on 03/07/21. She was seen in the  infusion area. She reports a new pain to her right shoulder and jaw that started on Wednesday (3/8). She reports she is still able to do some things around the house. She is eating some and drinking plenty. She notes her daughter and grandchildren have moved out, so at home is just her and Reyne Dumas.   All other systems were reviewed with the patient and are negative.  MEDICAL HISTORY:  Past Medical History:  Diagnosis Date   Abnormal Pap smear    Anxiety    Breast cancer ([place])    Cancer ([place]) 1999   breast-s/p mastectomy, chemo, rad   CIN I (cervical intraepithelial neoplasia I) 2003   by colpo   Congenital deafness    Deaf    Depression    History of radiation therapy    Right femur, Left hip- 12/09/20-12/25/20- Dr. Gery Pray   History of radiation therapy    right pelvis/right femur 09/28/2017-10/11/2017  Dr Jeneen Rinks  [place]   Port-A-Cath in place 2017   for chemotherapy   Radiation 07/12/15-07/26/15   left femur 30 Gy   S/P bilateral oophorectomy     SURGICAL HISTORY: Past Surgical History:  Procedure Laterality Date   ABDOMINAL HYSTERECTOMY     INTRAMEDULLARY (IM) NAIL INTERTROCHANTERIC Left 06/26/2015   Procedure: LEFT BIOMET LONG AFFIXS NAIL;  Surgeon: [name], MD;  Location: [facility];  Service: Orthopedics;  Laterality: Left;   IR GENERIC HISTORICAL  08/06/2015   IR US GUIDE VASC ACCESS LEFT 08/06/2015 Aletta Edouard, MD [facility]   IR GENERIC HISTORICAL  08/06/2015   IR FLUORO GUIDE CV LINE LEFT 08/06/2015 Aletta Edouard, MD [facility]   IR GENERIC HISTORICAL  03/05/2016   IR CV LINE INJECTION 03/05/2016 Markus Daft, MD [facility]   LEFT HEART CATH AND CORONARY ANGIOGRAPHY N/A 12/13/2017   Procedure: LEFT HEART CATH AND CORONARY ANGIOGRAPHY;  Surgeon: Leonie Man, MD;  Location: [facility];  Service: Cardiovascular;  Laterality: N/A;   MASTECTOMY     right breast   MASTECTOMY     OVARIAN CYST REMOVAL     RADIOLOGY WITH ANESTHESIA Left 06/25/2015    Procedure: MRI OF LEFT HIP WITH OR WITHOUT CONTRAST;  Surgeon: Medication Radiologist, MD;  Location: [facility];  Service: Radiology;  Laterality: Left;  DR. MCINTYRE/MRI   TUBAL LIGATION      I have reviewed the social history and family history with the patient and they are unchanged from previous note.  ALLERGIES:  is allergic to promethazine hcl, chlorhexidine, and diphenhydramine hcl.  MEDICATIONS:  Current Outpatient Medications  Medication Sig Dispense Refill   dexamethasone (DECADRON) 4 MG tablet Take 1 tablet (4 mg total) by mouth daily. For 3-5 days after chemo (Patient not taking: Reported on 11/20/2020) 30 tablet 0   HYDROmorphone (DILAUDID) 2 MG tablet Take 1 tablet (2 mg total) by mouth every 6 (six) hours as needed for severe pain. 45 tablet 0   lidocaine (LIDODERM) 5 % Place 1 patch onto the skin daily. Remove & Discard patch within 12 hours or as directed by MD 30 patch 0   lidocaine-prilocaine (EMLA) cream Apply 1 application topically as needed. Apply to Porta-Cath 1-2 hours prior to access as directed. 90 g 2   LORazepam (ATIVAN) 0.5 MG tablet Take 1 tablet (0.5 mg total) by mouth every 8 (eight) hours as needed for anxiety (nausea). 20 tablet 0   megestrol (MEGACE) 40 MG tablet Take 1 tablet (40 mg total) by mouth daily. 30 tablet 1   morphine (MS CONTIN) 15 MG 12 hr tablet Take 1 tablet (15 mg total) by mouth every 12 (twelve) hours. 60 tablet 0   potassium chloride (KLOR-CON) 10 MEQ tablet Take 1 tablet (10 mEq total) by mouth 3 (three) times daily. 90 tablet 2   prochlorperazine (COMPAZINE) 10 MG tablet TAKE 1 TABLET(10 MG) BY MOUTH EVERY 6 HOURS AS NEEDED FOR NAUSEA OR VOMITING 60 tablet 1   zolpidem (AMBIEN) 10 MG tablet Take 1 tablet (10 mg total) by mouth at bedtime as needed for sleep. 30 tablet 0   No current facility-administered medications for this visit.   Facility-Administered Medications Ordered in Other Visits  Medication Dose Route Frequency Provider Last  Rate Last Admin   potassium chloride SA (K-DUR) CR tablet 40 mEq  40 mEq Oral Once Truitt Merle, MD       sodium chloride 0.9 % 1,000 mL with potassium chloride 10 mEq infusion  Intravenous Continuous Gordy Levan, MD   Stopped at 09/10/15 1653   sodium chloride 0.9 % injection 10 mL  10 mL Intravenous PRN Livesay, Lennis P, MD        PHYSICAL EXAMINATION: ECOG PERFORMANCE STATUS: 2 - Symptomatic, <50% confined to bed  There were no vitals filed for this visit. Wt Readings from Last 3 Encounters:  03/21/21 118 lb (53.5 kg)  03/07/21 120 lb (54.4 kg)  02/07/21 127 lb 2 oz (57.7 kg)     GENERAL:alert, no distress and comfortable SKIN: skin color normal, no rashes or significant lesions EYES: normal, Conjunctiva are pink and non-injected, sclera clear  NEURO: alert & oriented x 3 with fluent speech  LABORATORY DATA:  I have reviewed the data as listed CBC Latest Ref Rng & Units 03/21/2021 03/07/2021 02/07/2021  WBC 4.0 - 10.5 K/uL 2.4(L) 3.6(L) 11.1(H)  Hemoglobin 12.0 - 15.0 g/dL 9.0(L) 7.1(L) 8.0(L)  Hematocrit 36.0 - 46.0 % 27.3(L) 22.0(L) 24.7(L)  Platelets 150 - 400 K/uL 131(L) 142(L) 93(L)     CMP Latest Ref Rng & Units 03/21/2021 03/07/2021 02/07/2021  Glucose 70 - 99 mg/dL 137(H) 95 105(H)  BUN 6 - 20 mg/dL 9 8 9   Creatinine 0.44 - 1.00 mg/dL 0.84 0.87 0.79  Sodium 135 - 145 mmol/L 141 138 140  Potassium 3.5 - 5.1 mmol/L 3.1(L) 3.6 3.5  Chloride 98 - 111 mmol/L 105 104 105  CO2 22 - 32 mmol/L 28 27 27   Calcium 8.9 - 10.3 mg/dL 9.7 9.3 8.9  Total Protein 6.5 - 8.1 g/dL 6.3(L) 6.6 6.1(L)  Total Bilirubin 0.3 - 1.2 mg/dL 1.8(H) 1.8(H) 1.8(H)  Alkaline Phos 38 - 126 U/L 71 66 94  AST 15 - 41 U/L 21 20 19   ALT 0 - 44 U/L 9 9 8       RADIOGRAPHIC STUDIES: I have personally reviewed the radiological images as listed and agreed with the findings in the report. No results found.    No orders of the defined types were placed in this encounter.  All questions were  answered. The patient knows to call the clinic with any problems, questions or concerns. No barriers to learning was detected. The total time spent in the appointment was 30 minutes.     Truitt Merle, MD 03/21/2021   I, Wilburn Mylar, am acting as scribe for Truitt Merle, MD.   I have reviewed the above documentation for accuracy and completeness, and I agree with the above.

## 2021-03-21 NOTE — Patient Instructions (Signed)
[facility]  Discharge Instructions: ?Thank you for choosing [facility] to provide your oncology and hematology care.  ? ?If you have a lab appointment with the [facility], please go directly to the [facility] and check in at the registration area. ?  ?Wear comfortable clothing and clothing appropriate for easy access to any Portacath or PICC line.  ? ?We strive to give you quality time with your provider. You may need to reschedule your appointment if you arrive late (15 or more minutes).  Arriving late affects you and other patients whose appointments are after yours.  Also, if you miss three or more appointments without notifying the office, you may be dismissed from the clinic at the provider?s discretion.    ?  ?For prescription refill requests, have your pharmacy contact our office and allow 72 hours for refills to be completed.   ? ?Today you received the following chemotherapy and/or immunotherapy agents : Trastuzumab, Navelbine    ?  ?To help prevent nausea and vomiting after your treatment, we encourage you to take your nausea medication as directed. ? ?BELOW ARE SYMPTOMS THAT SHOULD BE REPORTED IMMEDIATELY: ?*FEVER GREATER THAN 100.4 F (38 ?C) OR HIGHER ?*CHILLS OR SWEATING ?*NAUSEA AND VOMITING THAT IS NOT CONTROLLED WITH YOUR NAUSEA MEDICATION ?*UNUSUAL SHORTNESS OF BREATH ?*UNUSUAL BRUISING OR BLEEDING ?*URINARY PROBLEMS (pain or burning when urinating, or frequent urination) ?*BOWEL PROBLEMS (unusual diarrhea, constipation, pain near the anus) ?TENDERNESS IN MOUTH AND THROAT WITH OR WITHOUT PRESENCE OF ULCERS (sore throat, sores in mouth, or a toothache) ?UNUSUAL RASH, SWELLING OR PAIN  ?UNUSUAL VAGINAL DISCHARGE OR ITCHING  ? ?Items with * indicate a potential emergency and should be followed up as soon as possible or go to the Emergency Department if any problems should occur. ? ?Please show the CHEMOTHERAPY ALERT CARD or IMMUNOTHERAPY ALERT CARD  at check-in to the Emergency Department and triage nurse. ? ?Should you have questions after your visit or need to cancel or reschedule your appointment, please contact [facility]  Dept: [phone]  and follow the prompts.  Office hours are 8:00 a.m. to 4:30 p.m. Monday - Friday. Please note that voicemails left after 4:00 p.m. may not be returned until the following business day.  We are closed weekends and major holidays. You have access to a nurse at all times for urgent questions. Please call the main number to the clinic Dept: [phone] and follow the prompts. ? ? ?For any non-urgent questions, you may also contact your provider using MyChart. We now offer e-Visits for anyone 78 and older to request care online for non-urgent symptoms. For details visit [email or web address]. ?  ?Also download the MyChart app! Go to the app store, search "MyChart", open the app, select [facility], and log in with your MyChart username and password. ? ?Due to Covid, a mask is required upon entering the hospital/clinic. If you do not have a mask, one will be given to you upon arrival. For doctor visits, patients may have 1 support person aged 20 or older with them. For treatment visits, patients cannot have anyone with them due to current Covid guidelines and our immunocompromised population.  ? ?

## 2021-03-22 ENCOUNTER — Encounter: Payer: Self-pay | Admitting: Hematology

## 2021-03-22 LAB — CANCER ANTIGEN 27.29: CA 27.29: 119.2 U/mL — ABNORMAL HIGH (ref 0.0–38.6)

## 2021-03-24 ENCOUNTER — Telehealth: Payer: Self-pay | Admitting: Hematology

## 2021-03-24 ENCOUNTER — Other Ambulatory Visit: Payer: Self-pay | Admitting: Hematology

## 2021-03-24 NOTE — Telephone Encounter (Signed)
Left message with follow-up appointments per 3/10 los. ?

## 2021-04-04 ENCOUNTER — Inpatient Hospital Stay: Payer: Medicaid Other | Admitting: Nurse Practitioner

## 2021-04-04 ENCOUNTER — Inpatient Hospital Stay: Payer: Medicaid Other

## 2021-04-07 ENCOUNTER — Other Ambulatory Visit: Payer: Self-pay | Admitting: Nurse Practitioner

## 2021-04-07 MED ORDER — MORPHINE SULFATE ER 15 MG PO TBCR: 15.0000 mg | EXTENDED_RELEASE_TABLET | Freq: Two times a day (BID) | ORAL | 0 refills | Status: DC

## 2021-04-18 ENCOUNTER — Inpatient Hospital Stay: Payer: Medicaid Other | Attending: Hematology

## 2021-04-18 ENCOUNTER — Inpatient Hospital Stay: Payer: Medicaid Other

## 2021-04-18 ENCOUNTER — Inpatient Hospital Stay (HOSPITAL_BASED_OUTPATIENT_CLINIC_OR_DEPARTMENT_OTHER): Payer: Medicaid Other | Admitting: Nurse Practitioner

## 2021-04-18 ENCOUNTER — Other Ambulatory Visit: Payer: Self-pay

## 2021-04-18 ENCOUNTER — Inpatient Hospital Stay (HOSPITAL_BASED_OUTPATIENT_CLINIC_OR_DEPARTMENT_OTHER): Payer: Medicaid Other | Admitting: Hematology

## 2021-04-18 ENCOUNTER — Encounter: Payer: Self-pay | Admitting: Hematology

## 2021-04-18 ENCOUNTER — Encounter: Payer: Self-pay | Admitting: Nurse Practitioner

## 2021-04-18 VITALS — BP 100/62 | HR 99 | Temp 98.2°F | Resp 17 | Wt 114.2 lb

## 2021-04-18 DIAGNOSIS — R5383 Other fatigue: Secondary | ICD-10-CM | POA: Diagnosis not present

## 2021-04-18 DIAGNOSIS — C50911 Malignant neoplasm of unspecified site of right female breast: Secondary | ICD-10-CM

## 2021-04-18 DIAGNOSIS — G893 Neoplasm related pain (acute) (chronic): Secondary | ICD-10-CM

## 2021-04-18 DIAGNOSIS — C7951 Secondary malignant neoplasm of bone: Secondary | ICD-10-CM

## 2021-04-18 DIAGNOSIS — D6959 Other secondary thrombocytopenia: Secondary | ICD-10-CM | POA: Insufficient documentation

## 2021-04-18 DIAGNOSIS — Z5112 Encounter for antineoplastic immunotherapy: Secondary | ICD-10-CM | POA: Diagnosis present

## 2021-04-18 DIAGNOSIS — Z170 Estrogen receptor positive status [ER+]: Secondary | ICD-10-CM | POA: Insufficient documentation

## 2021-04-18 DIAGNOSIS — D6481 Anemia due to antineoplastic chemotherapy: Secondary | ICD-10-CM

## 2021-04-18 DIAGNOSIS — Z95828 Presence of other vascular implants and grafts: Secondary | ICD-10-CM

## 2021-04-18 DIAGNOSIS — R110 Nausea: Secondary | ICD-10-CM

## 2021-04-18 DIAGNOSIS — R531 Weakness: Secondary | ICD-10-CM | POA: Diagnosis not present

## 2021-04-18 DIAGNOSIS — R634 Abnormal weight loss: Secondary | ICD-10-CM | POA: Diagnosis not present

## 2021-04-18 DIAGNOSIS — Z7189 Other specified counseling: Secondary | ICD-10-CM

## 2021-04-18 DIAGNOSIS — G620 Drug-induced polyneuropathy: Secondary | ICD-10-CM | POA: Diagnosis not present

## 2021-04-18 DIAGNOSIS — D630 Anemia in neoplastic disease: Secondary | ICD-10-CM | POA: Diagnosis not present

## 2021-04-18 DIAGNOSIS — Z515 Encounter for palliative care: Secondary | ICD-10-CM

## 2021-04-18 LAB — CBC WITH DIFFERENTIAL (CANCER CENTER ONLY)
Abs Immature Granulocytes: 0.07 10*3/uL (ref 0.00–0.07)
Basophils Absolute: 0 10*3/uL (ref 0.0–0.1)
Basophils Relative: 0 %
Eosinophils Absolute: 0.1 10*3/uL (ref 0.0–0.5)
Eosinophils Relative: 2 %
HCT: 20.2 % — ABNORMAL LOW (ref 36.0–46.0)
Hemoglobin: 6.7 g/dL — CL (ref 12.0–15.0)
Immature Granulocytes: 2 %
Lymphocytes Relative: 16 %
Lymphs Abs: 0.5 10*3/uL — ABNORMAL LOW (ref 0.7–4.0)
MCH: 29.8 pg (ref 26.0–34.0)
MCHC: 33.2 g/dL (ref 30.0–36.0)
MCV: 89.8 fL (ref 80.0–100.0)
Monocytes Absolute: 0.2 10*3/uL (ref 0.1–1.0)
Monocytes Relative: 5 %
Neutro Abs: 2.5 10*3/uL (ref 1.7–7.7)
Neutrophils Relative %: 75 %
Platelet Count: 68 10*3/uL — ABNORMAL LOW (ref 150–400)
RBC: 2.25 MIL/uL — ABNORMAL LOW (ref 3.87–5.11)
RDW: 18.4 % — ABNORMAL HIGH (ref 11.5–15.5)
WBC Count: 3.3 10*3/uL — ABNORMAL LOW (ref 4.0–10.5)
nRBC: 1.2 % — ABNORMAL HIGH (ref 0.0–0.2)

## 2021-04-18 LAB — CMP (CANCER CENTER ONLY)
ALT: 9 U/L (ref 0–44)
AST: 27 U/L (ref 15–41)
Albumin: 3.6 g/dL (ref 3.5–5.0)
Alkaline Phosphatase: 94 U/L (ref 38–126)
Anion gap: 11 (ref 5–15)
BUN: 10 mg/dL (ref 6–20)
CO2: 24 mmol/L (ref 22–32)
Calcium: 9.4 mg/dL (ref 8.9–10.3)
Chloride: 103 mmol/L (ref 98–111)
Creatinine: 1.03 mg/dL — ABNORMAL HIGH (ref 0.44–1.00)
GFR, Estimated: >60 mL/min (ref >60)
Glucose, Bld: 168 mg/dL — ABNORMAL HIGH (ref 70–99)
Potassium: 3 mmol/L — ABNORMAL LOW (ref 3.5–5.1)
Sodium: 138 mmol/L (ref 135–145)
Total Bilirubin: 2.5 mg/dL — ABNORMAL HIGH (ref 0.3–1.2)
Total Protein: 6.4 g/dL — ABNORMAL LOW (ref 6.5–8.1)

## 2021-04-18 LAB — PREPARE RBC (CROSSMATCH)

## 2021-04-18 MED ORDER — LORATADINE 10 MG PO TABS
10.0000 mg | ORAL_TABLET | Freq: Every day | ORAL | Status: DC
  Administered 2021-04-18: 10 mg via ORAL
  Filled 2021-04-18: qty 1

## 2021-04-18 MED ORDER — SODIUM CHLORIDE 0.9% FLUSH
10.0000 mL | INTRAVENOUS | Status: DC | PRN
  Administered 2021-04-18: 10 mL

## 2021-04-18 MED ORDER — LORAZEPAM 0.5 MG PO TABS: 0.5000 mg | ORAL_TABLET | Freq: Three times a day (TID) | ORAL | 0 refills | Status: DC | PRN

## 2021-04-18 MED ORDER — SODIUM CHLORIDE 0.9% FLUSH
10.0000 mL | INTRAVENOUS | Status: DC | PRN
  Administered 2021-04-18: 10 mL via INTRAVENOUS

## 2021-04-18 MED ORDER — HEPARIN SOD (PORK) LOCK FLUSH 100 UNIT/ML IV SOLN
500.0000 [IU] | Freq: Once | INTRAVENOUS | Status: AC | PRN
  Administered 2021-04-18: 500 [IU]

## 2021-04-18 MED ORDER — ACETAMINOPHEN 325 MG PO TABS
650.0000 mg | ORAL_TABLET | Freq: Once | ORAL | Status: AC
  Administered 2021-04-18: 650 mg via ORAL
  Filled 2021-04-18: qty 2

## 2021-04-18 MED ORDER — TRASTUZUMAB-DKST CHEMO 150 MG IV SOLR
4.0000 mg/kg | Freq: Once | INTRAVENOUS | Status: AC
  Administered 2021-04-18: 231 mg via INTRAVENOUS
  Filled 2021-04-18: qty 11

## 2021-04-18 MED ORDER — SODIUM CHLORIDE 0.9 % IV SOLN: Freq: Once | INTRAVENOUS | Status: AC

## 2021-04-18 NOTE — Progress Notes (Signed)
Order placed through [facility] to [facility] for [facility].  ?

## 2021-04-18 NOTE — Progress Notes (Signed)
Nikki in [facility] confirmed T&S and Prepare orders.  Pt will get 2units of PRBCs on Monday, 04/21/2021. ?

## 2021-04-18 NOTE — Progress Notes (Signed)
? ?  ?Palliative Medicine ?[facility]  ?[phone] [phone] ? ? ?Name: [name] ?Date: 04/18/2021 ?MRN: [id number]  ?DOB: 06/26/1965 ? ?Patient Care Team: ?[name], Margaretha Seeds as PCP - General (Physician Assistant) ?[name], MD as Consulting Physician (Orthopedic Surgery) ?Pickenpack-Cousar, Carlena Sax, NP as Nurse Practitioner (Nurse Practitioner)  ? ? ?INTERVAL HISTORY: ?[name] is a 56 y.o. female with metastatic right breast (initial diagnosis 11/1997) s/p right mastectomy, chemotherapy, currently receiving vinorelbine, GERD, vertigo, congenital deafness, chemo-induced neuropathy, osteopenia, depression, anxiety, and cancer-related pain.  Palliative ask to see for symptom management and goals of care.  ? ?SOCIAL HISTORY:    ? reports that she has never smoked. She has never used smokeless tobacco. She reports that she does not drink alcohol and does not use drugs. ? ?ADVANCE DIRECTIVES:  ?Patient does not have a completed advanced directive. She understands her husband, [name] is considered HCPOA in the absence of such documents. Is aware of advanced directives clinic if she would like for [place] to make an appointment. ?  ? ?CODE STATUS: DNR ? ?PAST MEDICAL HISTORY: ?Past Medical History:  ?Diagnosis Date  ? Abnormal Pap smear   ? Anxiety   ? Breast cancer ([place])   ? Cancer [facility]) 1999  ? breast-s/p mastectomy, chemo, rad  ? CIN I (cervical intraepithelial neoplasia I) 2003  ? by colpo  ? Congenital deafness   ? Deaf   ? Depression   ? History of radiation therapy   ? Right femur, Left hip- 12/09/20-12/25/20- Dr. Gery Pray  ? History of radiation therapy   ? right pelvis/right femur 09/28/2017-10/11/2017  Dr Gery Pray  ? Port-A-Cath in place 2017  ? for chemotherapy  ? Radiation 07/12/15-07/26/15  ? left femur 30 Gy  ? S/P bilateral oophorectomy   ? ? ?ALLERGIES:  is allergic to promethazine hcl, chlorhexidine, and diphenhydramine  hcl. ? ?MEDICATIONS:  ?Current Outpatient Medications  ?Medication Sig Dispense Refill  ? dexamethasone (DECADRON) 4 MG tablet Take 1 tablet (4 mg total) by mouth daily. For 3-5 days after chemo (Patient not taking: Reported on 11/20/2020) 30 tablet 0  ? HYDROmorphone (DILAUDID) 2 MG tablet Take 1 tablet (2 mg total) by mouth every 6 (six) hours as needed for severe pain. 45 tablet 0  ? lidocaine (LIDODERM) 5 % Place 1 patch onto the skin daily. Remove & Discard patch within 12 hours or as directed by MD 30 patch 0  ? lidocaine-prilocaine (EMLA) cream Apply 1 application topically as needed. Apply to Porta-Cath 1-2 hours prior to access as directed. 90 g 2  ? LORazepam (ATIVAN) 0.5 MG tablet Take 1 tablet (0.5 mg total) by mouth every 8 (eight) hours as needed for anxiety (nausea). 30 tablet 0  ? megestrol (MEGACE) 40 MG tablet Take 1 tablet (40 mg total) by mouth daily. 30 tablet 1  ? morphine (MS CONTIN) 15 MG 12 hr tablet Take 1 tablet (15 mg total) by mouth every 12 (twelve) hours. 60 tablet 0  ? potassium chloride (KLOR-CON) 10 MEQ tablet Take 1 tablet (10 mEq total) by mouth 3 (three) times daily. 90 tablet 2  ? prochlorperazine (COMPAZINE) 10 MG tablet TAKE 1 TABLET(10 MG) BY MOUTH EVERY 6 HOURS AS NEEDED FOR NAUSEA OR VOMITING 60 tablet 1  ? zolpidem (AMBIEN) 10 MG tablet Take 1 tablet (10 mg total) by mouth at bedtime as needed for sleep. 30 tablet 0  ? ?No current facility-administered medications for this  visit.  ? ?Facility-Administered Medications Ordered in Other Visits  ?Medication Dose Route Frequency Provider Last Rate Last Admin  ? loratadine (CLARITIN) tablet 10 mg  10 mg Oral Daily Truitt Merle, MD   10 mg at 04/18/21 1052  ? potassium chloride SA (K-DUR) CR tablet 40 mEq  40 mEq Oral Once Truitt Merle, MD      ? sodium chloride 0.9 % 1,000 mL with potassium chloride 10 mEq infusion   Intravenous Continuous Gordy Levan, MD   Stopped at 09/10/15 1653  ? sodium chloride 0.9 % injection 10 mL  10 mL  Intravenous PRN Livesay, Lennis P, MD      ? sodium chloride flush (NS) 0.9 % injection 10 mL  10 mL Intracatheter PRN Truitt Merle, MD   10 mL at 04/18/21 1234  ? ? ?VITAL SIGNS: ?There were no vitals taken for this visit. ?There were no vitals filed for this visit.  ?Estimated body mass index is 18.44 kg/m? as calculated from the following: ?  Height as of 12/12/20: '5\' 6"'$  (1.676 m). ?  Weight as of an earlier encounter on 04/18/21: 114 lb 4 oz (51.8 kg). ? ? ?PERFORMANCE STATUS (ECOG) : 2 - Symptomatic, <50% confined to bed ? ? ?Physical Exam ?General: NAD, cachectic, weak appearing   ?Cardiovascular: regular rate and rhythm ?Pulmonary: clear ant fields ?Extremities: no edema, no joint deformities ?Neurological:  AAOx3, fluent with sign-language interpretor  ? ?IMPRESSION: ? ?I saw [name] during her infusion today. She appears comfortable, resting in bed. No family present, however Interpretor is at the bedside. Was seen by Dr. Burr Medico today also. Weak appearing which she endorses. Continues to have poor appetite and weight loss.  ? ?She is not showing any improvement and continues to decline. Dr. Burr Medico provided updates and spoke with her daughter. Plans to have modified treatment today and have discussions in 3 weeks regarding prognosis and future plans.  ? ? ?Pain ?Ongoing discussions regarding pain regimen. She is taking MS Contin ER at night and Dilaudid po for breakthrough. She endorses sleeping on and off despite use of medication but feels it is worst when she does. She has been taking the hydromorphone during the day due to increased pain. Recommend taking her MS Contin in the morning and at night however she is reluctant at this time. We will continue to closely monitor.  ? ?Fatigue ?Continues to endorse fatigue. States she sleeps on and off during the day. Is able to do some activity around the home and play with her granddaughter but with frequent rest breaks.  ? ?Poor appetite/weight loss ?Appetite  remains poor despite megace use. Is attempting to eat as much as possible. Has no appetite and endorses ongoing nausea when she does force herself to eat. Her weight continues to decrease at each visit. She does not tolerate boost or ensure. Current weight today is  114.4lb down from 120lbs 2/24, 127 lbs on 1/27.  ? ? ?Nausea  ?[name] shares intermittent nausea and vomiting. Feels as though the compazine is effective. We discussed regimen of how she is taking compazine. She is taking the compazine twice daily. Education provided on availability to take every 6 hours as needed. Does not feel zofran is effective. Education provided on use of ativan in addition to compazine. We will send in to her pharmacy.  ? ?5. Goals of Care ? ?I created space and opportunity for [name] to express her feelings regarding ongoing decline and poor quality of life.  She is emotional stating she is "scared" and does not want to die. Emotional support provided. We discussed what quality of life means and focusing on her comfort. Shared that if treatment is not "helping" and health continues to decline it is most important to focus on doing things for her to allow comfort and to spend time with her family for as long as she can vs. Continuing to do things to her that is not going to make a meaningful difference. She verbalized understanding. She is planning to reach out to her daughter in [place] and also continue discussions with her family. Acknowledged plans to have family discussions in 3 weeks at her follow-up visit.  ? ?She expressed understanding of potential hospice recommendations although she is adamant she would not want anyone coming into her home.  ? ? ?I discussed the importance of continued conversation with family and their medical providers regarding overall plan of care and treatment options, ensuring decisions are within the context of the patients values and GOCs. ? ?PLAN: ?MS Contin 30 mg QHS. She is not able to tolerate  15 mg BID due to drowsiness. Continues to have pain. Recommended increased dosage at night to minimize drowsiness in the day and need for BID. Endorses increase in pain over past several weeks. Advised to take MS Contin tw

## 2021-04-18 NOTE — Progress Notes (Signed)
No Zometa today per Dr. Burr Medico. ? ?Kennith Center, Pharm.D., CPP ?04/18/2021'@11'$ :10 AM ? ? ?

## 2021-04-18 NOTE — Progress Notes (Signed)
?[facility]   ?[phone] [phone]   ?Clinic Follow up Note  ? ?Patient Care Team: ?[name], Margaretha Seeds as PCP - General (Physician Assistant) ?[name], MD as Consulting Physician (Orthopedic Surgery) ?Pickenpack-Cousar, Carlena Sax, NP as Nurse Practitioner (Nurse Practitioner) ? ?Date of Service:  04/18/2021 ? ?CHIEF COMPLAINT: f/u of metastatic breast cancer ? ?CURRENT THERAPY:  ?Trastuzumab D1, and Vinorelbine D1 and 8 every 21 days, starting 11/01/20, changed to q14d 02/07/21 ? ?ASSESSMENT & PLAN:  ?[name] is a 56 y.o. female with  ? ?1. 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 She had right mastectomy, 4 cycles of AC-T and 5 years of Tamoxifen/AI. ?-In 06/2015 she developed bone metastases. ?-she has had multiple endocrine therapy and chemotherapy, overall does not tolerate treatment well and frequently required dose reduction ?-We switched her treatment to CMF and Keytruda on 08/02/20, and continued herceptin. Due to poor tolerance, I dropped the methotrexate and fluorouracil and delayed her third cycle to 09/20/20, so she received cytoxan, Beryle Flock, and herceptin. She tolerated much better with just some fatigue and mild headaches. ?-restaging PET 10/31/20 unfortunately showed significantly worsening disease. ?-she was switched to trastuzumab and vinorelbine on 11/01/20. She is tolerating moderately well with nausea and fatigue, dose reduced after cycle 1  ?-PET scan 02/04/21 showed overall stable disease with some new, small mediastinal and hilar lymph nodes. Her tumor marker is stable, plan to continue chemo  ?-we changed her treatment to once every 2 weeks starting 02/07/21 due to her fatigue and overall low PS ?-She states she is not able to do much around the house, is vomiting and not eating well. Hs PS has dropped to 3 now. Labs reviewed, hgb 6.7, plt  68k, cr 1.03, tbili 2.5. I recommend stopping chemo. She became upset and would like to continue treatment as long as possible.  I had a long conversation with her and her daughter on the phone, regarding goals of care, and the little or no benefit of chemotherapy at this point.  Due to her significant thrombocytopenia and elevated bilirubin, I will hold chemo today, and proceed with Herceptin.   ?-I discussed the benefit and the logistics of home hospice care, she declined.  She has 4 dogs at home and does not want any stranger to visit her at home. ?-Plan to follow-up in 3 weeks to review goals of care again.  We will schedule on Friday so her husband Reyne Dumas and daughter [name] can both be present for discussion. ?  ?2. Symptom Management: nausea and vomiting with weight loss, depression with fatigue ?-with her increased bone pains, she has had more nausea and vomiting. Because of this, she has lost weight.  ?-she did not tolerate Boost or Ensure. We previously reviewed other options.  ?-she reports the compazine is helpful for her nausea, however she is still vomiting. ?  ?3. Body pain, secondary to bone mets   ?-She is being treated with Zometa q62months. ?-She will continue oral calcium and Vit D and Prilosec for Acid Reflux.  ?-She has received palliative radiation therapy to the left femur (2017), right sacroiliac area (2019), and right femur and left hip (11/28-12/14/22) under Dr. Sondra Come. ?-she currently has MS contin and dilaudid for pain, which she feels is more helpful than the oxycodone. ?  ?4. Goal of care discussion, DNR/DNI ?-We previously reviewed goal of care and  I recommend DNR/DNI, she agreed again 07/19/20. ?-I again encouraged her to start setting up her living will. Her husband [name] Hachita is automatically her HCPOA.  ?-Though her care is palliative, our goal is to prolong her life. She again states she would like to continue treatment. ?-she declines home palliative care as she does not want a  stranger to come into her home. ?  ?5. Congenital deafness ?  ?6. Mild anemia, secondary to chemotherapy and malignancy  ?-her hgb decreased to 7.8 on 09/13/20.  ?-improved off methotrexate and fluorouraci.  ?-monitoring on trastuzumab/vinorelbine; she has required blood transfusions intermittently. ?-hgb 6.7 today (04/18/21), we will schedule blood transfusion on Monday. ?  ?7. Neuropathy G1 ?-Secondary to chemo. Mild, only in her feet. ?-worsened with CMF, remains mild off treatment. ?  ?  ?PLAN:   ?-proceed with trastuzumab today and hold vinorelbine today due to abnormal labs ?-Blood transfusion next Monday, she is only willing to get 1 unit blood. ?-I recommend home hospice, she declined. ?-lab, flush, and f/u in 3 weeks, plan to meet her husband and a daughter on next visit, to discuss goals of care and hospice again. ? ? ?No problem-specific Assessment & Plan notes found for this encounter. ? ? ?SUMMARY OF ONCOLOGIC HISTORY: ?Oncology History Overview Note  ? Cancer Staging ?No matching staging information was found for the patient. ? ? ?  ?[name], right ([place])  ?11/1997 Cancer Diagnosis  ? Patient had 1.4 cm poorly differentiated right breast carcinoma diagnosed in Nov 1999 at age 85, 1/7 axillary nodes involved, ER/PR and HER 2 positive. She had mastectomy with the 7 axillary node evaluation, 4 cycles of adriamycin/ cytoxan followed by taxotere, then five years of tamoxifen thru June 2005 (no herceptin in 1999). She had bilateral oophorectomy in May 2005. She was briefly on aromatase inhibitor after tamoxifen, but discontinued this herself due to poor tolerance. ?  ?04/04/2015 Genetic Testing  ? Negative genetic testing on the breast/ovarian cancer pnael.  The Breast/Ovarian gene panel offered by [facility] includes sequencing and rearrangement analysis for the following 20 genes:  ATM, BARD1, BRCA1, BRCA2, BRIP1, CDH1, CHEK2, EPCAM, FANCC, MLH1, MSH2, MSH6, NBN, PALB2, PMS2, PTEN,  RAD51C, RAD51D, TP53, and XRCC2. ?  ?06/26/2015 Pathology Results  ? Initial Path Report ?Bone, curettage, Left femur met, intramedullary subtroch tissue ?- METASTATIC ADENOCARCINOMA. ? ?Estrogen Receptor: 95%, POSITIVE, STRONG STAINING INTENSITY ?Progesterone Receptor: 2%, POSITIVE, STRONG  ?HER2 (+), IHC 3+ ?  ?06/26/2015 Surgery  ? Biopsy of metastatic tissue and stabilization with Affixus ?trochanteric nail, proximal and distal interlock of left femur by Dr. [name]  ?  ?06/28/2015 Imaging  ? CT Chest w/ Contrast ?1. Extensive right internal mammary chain lymphadenopathy consistent with metastatic breast cancer. ?2. Lytic metastases involving the posterior elements at T1 with probable nondisplaced pathologic fracture of the spinous process. ?3. No other evidence of thoracic metastatic disease. ?4. Trace bilateral pleural effusions with associated bibasilar ?atelectasis. ?5. Indeterminate right thyroid nodule, not previously imaged. This could be evaluated with thyroid ultrasound as clinically warranted. ?  ?06/29/2015 -  Chemotherapy  ? Zometa started monthly on 06/29/15 and switched to q56months from 01/08/17 ?  ?07/01/2015 Imaging  ? Bone Scan ?1. Increased activity noted throughout the left femur. Although metastatic disease cannot be excluded, these changes are most likely secondary to prior surgery. ?2. No other focal abnormalities identified to suggest metastatic disease. ?  ?07/12/2015 - 07/26/2015 Radiation Therapy  ? Left femur, 30 Gy in  10 fractions by Dr. Sondra Come  ?  ?07/14/2015 Initial Diagnosis  ? [name], right [facility]) ?  ?07/19/2015 Imaging  ? Initial PET Scan ?1. Severe multifocal osseous metastatic disease, much greater than anticipated based on prior imaging. ?2. Multifocal nodal metastases involving the right internal mammary, prevascular and subpectoral lymph nodes. No axillary pulmonary involvement identified. ?3. No distant extra osseous metastases. ?  ?07/30/2015 - 11/29/2015  Chemotherapy  ? Docetaxel, Herceptin and Perjeta every 3 weeks X6 cycles, pt tolerated moderately well, restaging scan showed excellent response  ?  ?09/18/2015 Imaging  ? CT Head w/wo Contrast ?1. No acute intrac

## 2021-04-18 NOTE — Progress Notes (Signed)
Ok to proceed with herceptin today with lab values per Dr. Burr Medico.  ?

## 2021-04-18 NOTE — Progress Notes (Signed)
Spoke w/ MD Burr Medico and she would like to continue previous dose of trastuzumab ?

## 2021-04-18 NOTE — Patient Instructions (Signed)
[facility]COL[facility]  Discharge Instructions: ?Thank you for choosing [facility] to provide your oncology and hematology care.  ? ?If you have a lab appointment with the Cancer Center, please go directly to the Cancer Center and check in at the registration area. ?  ?Wear comfortable clothing and clothing appropriate for easy access to any Portacath or PICC line.  ? ?We strive to give you quality time with your provider. You may need to reschedule your appointment if you arrive late (15 or more minutes).  Arriving late affects you and other patients whose appointments are after yours.  Also, if you miss three or more appointments without notifying the office, you may be dismissed from the clinic at the provider?s discretion.    ?  ?For prescription refill requests, have your pharmacy contact our office and allow 72 hours for refills to be completed.   ? ?Today you received the following chemotherapy and/or immunotherapy agents: Trastuzumab    ?  ?To help prevent nausea and vomiting after your treatment, we encourage you to take your nausea medication as directed. ? ?BELOW ARE SYMPTOMS THAT SHOULD BE REPORTED IMMEDIATELY: ?*FEVER GREATER THAN 100.4 F (38 ?C) OR HIGHER ?*CHILLS OR SWEATING ?*NAUSEA AND VOMITING THAT IS NOT CONTROLLED WITH YOUR NAUSEA MEDICATION ?*UNUSUAL SHORTNESS OF BREATH ?*UNUSUAL BRUISING OR BLEEDING ?*URINARY PROBLEMS (pain or burning when urinating, or frequent urination) ?*BOWEL PROBLEMS (unusual diarrhea, constipation, pain near the anus) ?TENDERNESS IN MOUTH AND THROAT WITH OR WITHOUT PRESENCE OF ULCERS (sore throat, sores in mouth, or a toothache) ?UNUSUAL RASH, SWELLING OR PAIN  ?UNUSUAL VAGINAL DISCHARGE OR ITCHING  ? ?Items with * indicate a potential emergency and should be followed up as soon as possible or go to the Emergency Department if any problems should occur. ? ?Please show the CHEMOTHERAPY ALERT CARD or IMMUNOTHERAPY ALERT CARD at check-in  to the Emergency Department and triage nurse. ? ?Should you have questions after your visit or need to cancel or reschedule your appointment, please contact [facility]  Dept: [phone]  and follow the prompts.  Office hours are 8:00 a.m. to 4:30 p.m. Monday - Friday. Please note that voicemails left after 4:00 p.m. may not be returned until the following business day.  We are closed weekends and major holidays. You have access to a nurse at all times for urgent questions. Please call the main number to the clinic Dept: [phone] and follow the prompts. ? ? ?For any non-urgent questions, you may also contact your provider using MyChart. We now offer e-Visits for anyone 18 and older to request care online for non-urgent symptoms. For details visit [email or web address]. ?  ?Also download the MyChart app! Go to the app store, search "MyChart", open the app, select [facility], and log in with your MyChart username and password. ? ?Due to Covid, a mask is required upon entering the hospital/clinic. If you do not have a mask, one will be given to you upon arrival. For doctor visits, patients may have 1 support person aged 18 or older with them. For treatment visits, patients cannot have anyone with them due to current Covid guidelines and our immunocompromised population.  ? ?

## 2021-04-19 DIAGNOSIS — C50919 Malignant neoplasm of unspecified site of unspecified female breast: Secondary | ICD-10-CM | POA: Diagnosis not present

## 2021-04-19 LAB — CANCER ANTIGEN 27.29: CA 27.29: 124.8 U/mL — ABNORMAL HIGH (ref 0.0–38.6)

## 2021-04-21 ENCOUNTER — Inpatient Hospital Stay: Payer: Medicaid Other

## 2021-04-21 ENCOUNTER — Other Ambulatory Visit: Payer: Self-pay

## 2021-04-21 VITALS — BP 99/58 | HR 80 | Temp 97.5°F | Resp 18

## 2021-04-21 DIAGNOSIS — C50911 Malignant neoplasm of unspecified site of right female breast: Secondary | ICD-10-CM

## 2021-04-21 DIAGNOSIS — Z5112 Encounter for antineoplastic immunotherapy: Secondary | ICD-10-CM | POA: Diagnosis not present

## 2021-04-21 DIAGNOSIS — Z95828 Presence of other vascular implants and grafts: Secondary | ICD-10-CM

## 2021-04-21 DIAGNOSIS — D6481 Anemia due to antineoplastic chemotherapy: Secondary | ICD-10-CM

## 2021-04-21 MED ORDER — SODIUM CHLORIDE 0.9% IV SOLUTION
250.0000 mL | Freq: Once | INTRAVENOUS | Status: AC
  Administered 2021-04-21: 250 mL via INTRAVENOUS

## 2021-04-21 MED ORDER — SODIUM CHLORIDE 0.9% FLUSH: 10.0000 mL | INTRAVENOUS | Status: DC | PRN

## 2021-04-21 MED ORDER — LORATADINE 10 MG PO TABS
10.0000 mg | ORAL_TABLET | Freq: Every day | ORAL | Status: DC
  Administered 2021-04-21: 10 mg via ORAL
  Filled 2021-04-21: qty 1

## 2021-04-21 MED ORDER — ACETAMINOPHEN 325 MG PO TABS
650.0000 mg | ORAL_TABLET | Freq: Once | ORAL | Status: AC
  Administered 2021-04-21: 650 mg via ORAL
  Filled 2021-04-21: qty 2

## 2021-04-21 MED ORDER — HEPARIN SOD (PORK) LOCK FLUSH 100 UNIT/ML IV SOLN
500.0000 [IU] | Freq: Once | INTRAVENOUS | Status: AC | PRN
  Administered 2021-04-21: 500 [IU] via INTRAVENOUS

## 2021-04-21 NOTE — Progress Notes (Signed)
Put in orders for 1 unit of blood to be infused on Thursday 04/24/2021.  Pt will need a T&S drawn Thursday prior to blood transfusion d/t new T&S was not drawn when pt was in infusion on Monday 04/21/2021. ?

## 2021-04-21 NOTE — Progress Notes (Signed)
Per Burr Medico MD, pt needs two units of PRBC due to marked anemia. Pt declined second unit PRBC today and declined to be scheduled to receive second unit later in the week. RN notified MD.  ? ?Toward end of appointment, pt stated she would like to be scheduled for a second unit sometime this week. RN notified MD of pt's new decision.  ?

## 2021-04-21 NOTE — Patient Instructions (Signed)

## 2021-04-22 LAB — BPAM RBC
Blood Product Expiration Date: 202305042359
Blood Product Expiration Date: 202305042359
ISSUE DATE / TIME: 202304100956
Unit Type and Rh: 5100
Unit Type and Rh: 5100

## 2021-04-22 LAB — TYPE AND SCREEN
ABO/RH(D): O POS
Antibody Screen: NEGATIVE
Unit division: 0
Unit division: 0

## 2021-04-23 ENCOUNTER — Telehealth: Payer: Self-pay | Admitting: Hematology

## 2021-04-23 NOTE — Telephone Encounter (Signed)
Left message with follow-up appointment per 4/7 los. ?

## 2021-04-24 ENCOUNTER — Inpatient Hospital Stay: Payer: Medicaid Other

## 2021-04-24 ENCOUNTER — Other Ambulatory Visit: Payer: Self-pay

## 2021-04-24 DIAGNOSIS — Z5112 Encounter for antineoplastic immunotherapy: Secondary | ICD-10-CM | POA: Diagnosis not present

## 2021-04-24 DIAGNOSIS — D6481 Anemia due to antineoplastic chemotherapy: Secondary | ICD-10-CM

## 2021-04-24 DIAGNOSIS — C50911 Malignant neoplasm of unspecified site of right female breast: Secondary | ICD-10-CM

## 2021-04-24 DIAGNOSIS — Z95828 Presence of other vascular implants and grafts: Secondary | ICD-10-CM

## 2021-04-24 LAB — CBC WITH DIFFERENTIAL (CANCER CENTER ONLY)
Abs Immature Granulocytes: 0.05 10*3/uL (ref 0.00–0.07)
Basophils Absolute: 0 10*3/uL (ref 0.0–0.1)
Basophils Relative: 1 %
Eosinophils Absolute: 0.1 10*3/uL (ref 0.0–0.5)
Eosinophils Relative: 4 %
HCT: 23.4 % — ABNORMAL LOW (ref 36.0–46.0)
Hemoglobin: 7.7 g/dL — ABNORMAL LOW (ref 12.0–15.0)
Immature Granulocytes: 2 %
Lymphocytes Relative: 19 %
Lymphs Abs: 0.5 10*3/uL — ABNORMAL LOW (ref 0.7–4.0)
MCH: 28.7 pg (ref 26.0–34.0)
MCHC: 32.9 g/dL (ref 30.0–36.0)
MCV: 87.3 fL (ref 80.0–100.0)
Monocytes Absolute: 0.2 10*3/uL (ref 0.1–1.0)
Monocytes Relative: 6 %
Neutro Abs: 1.9 10*3/uL (ref 1.7–7.7)
Neutrophils Relative %: 68 %
Platelet Count: 53 10*3/uL — ABNORMAL LOW (ref 150–400)
RBC: 2.68 MIL/uL — ABNORMAL LOW (ref 3.87–5.11)
RDW: 18.6 % — ABNORMAL HIGH (ref 11.5–15.5)
WBC Count: 2.8 10*3/uL — ABNORMAL LOW (ref 4.0–10.5)
nRBC: 0.7 % — ABNORMAL HIGH (ref 0.0–0.2)

## 2021-04-24 LAB — CMP (CANCER CENTER ONLY)
ALT: 9 U/L (ref 0–44)
AST: 24 U/L (ref 15–41)
Albumin: 3.6 g/dL (ref 3.5–5.0)
Alkaline Phosphatase: 95 U/L (ref 38–126)
Anion gap: 12 (ref 5–15)
BUN: 10 mg/dL (ref 6–20)
CO2: 24 mmol/L (ref 22–32)
Calcium: 9.8 mg/dL (ref 8.9–10.3)
Chloride: 102 mmol/L (ref 98–111)
Creatinine: 1.03 mg/dL — ABNORMAL HIGH (ref 0.44–1.00)
GFR, Estimated: >60 mL/min (ref >60)
Glucose, Bld: 134 mg/dL — ABNORMAL HIGH (ref 70–99)
Potassium: 3 mmol/L — ABNORMAL LOW (ref 3.5–5.1)
Sodium: 138 mmol/L (ref 135–145)
Total Bilirubin: 2.8 mg/dL — ABNORMAL HIGH (ref 0.3–1.2)
Total Protein: 6.4 g/dL — ABNORMAL LOW (ref 6.5–8.1)

## 2021-04-24 LAB — PREPARE RBC (CROSSMATCH)

## 2021-04-24 MED ORDER — SODIUM CHLORIDE 0.9% FLUSH
10.0000 mL | Freq: Once | INTRAVENOUS | Status: AC
  Administered 2021-04-24: 10 mL via INTRAVENOUS

## 2021-04-24 MED ORDER — ACETAMINOPHEN 325 MG PO TABS
650.0000 mg | ORAL_TABLET | Freq: Once | ORAL | Status: AC
  Administered 2021-04-24: 650 mg via ORAL
  Filled 2021-04-24: qty 2

## 2021-04-24 MED ORDER — LORATADINE 10 MG PO TABS
10.0000 mg | ORAL_TABLET | Freq: Once | ORAL | Status: AC
  Administered 2021-04-24: 10 mg via ORAL
  Filled 2021-04-24: qty 1

## 2021-04-24 MED ORDER — SODIUM CHLORIDE 0.9% IV SOLUTION
250.0000 mL | Freq: Once | INTRAVENOUS | Status: AC
  Administered 2021-04-24: 250 mL via INTRAVENOUS

## 2021-04-24 MED ORDER — HEPARIN SOD (PORK) LOCK FLUSH 100 UNIT/ML IV SOLN
500.0000 [IU] | Freq: Once | INTRAVENOUS | Status: AC
  Administered 2021-04-24: 500 [IU] via INTRAVENOUS

## 2021-04-24 NOTE — Patient Instructions (Signed)
Blood Transfusion, Adult, Care After This sheet gives you information about how to care for yourself after your procedure. Your doctor may also give you more specific instructions. If you have problems or questions, contact your doctor. What can I expect after the procedure? After the procedure, it is common to have: Bruising and soreness at the IV site. A headache. Follow these instructions at home: Insertion site care   Follow instructions from your doctor about how to take care of your insertion site. This is where an IV tube was put into your vein. Make sure you: Wash your hands with soap and water before and after you change your bandage (dressing). If you cannot use soap and water, use hand sanitizer. Change your bandage as told by your doctor. Check your insertion site every day for signs of infection. Check for: Redness, swelling, or pain. Bleeding from the site. Warmth. Pus or a bad smell. General instructions Take over-the-counter and prescription medicines only as told by your doctor. Rest as told by your doctor. Go back to your normal activities as told by your doctor. Keep all follow-up visits as told by your doctor. This is important. Contact a doctor if: You have itching or red, swollen areas of skin (hives). You feel worried or nervous (anxious). You feel weak after doing your normal activities. You have redness, swelling, warmth, or pain around the insertion site. You have blood coming from the insertion site, and the blood does not stop with pressure. You have pus or a bad smell coming from the insertion site. Get help right away if: You have signs of a serious reaction. This may be coming from an allergy or the body's defense system (immune system). Signs include: Trouble breathing or shortness of breath. Swelling of the face or feeling warm (flushed). Fever or chills. Head, chest, or back pain. Dark pee (urine) or blood in the pee. Widespread rash. Fast  heartbeat. Feeling dizzy or light-headed. You may receive your blood transfusion in an outpatient setting. If so, you will be told whom to contact to report any reactions. These symptoms may be an emergency. Do not wait to see if the symptoms will go away. Get medical help right away. Call your local emergency services (911 in the [place] Do not drive yourself to the hospital. Summary Bruising and soreness at the IV site are common. Check your insertion site every day for signs of infection. Rest as told by your doctor. Go back to your normal activities as told by your doctor. Get help right away if you have signs of a serious reaction. This information is not intended to replace advice given to you by your health care provider. Make sure you discuss any questions you have with your health care provider. Document Revised: 04/25/2020 Document Reviewed: 06/23/2018 Elsevier Patient Education  2022 [employer]  

## 2021-04-25 LAB — TYPE AND SCREEN
ABO/RH(D): O POS
Antibody Screen: NEGATIVE
Unit division: 0

## 2021-04-25 LAB — BPAM RBC
Blood Product Expiration Date: 202305152359
ISSUE DATE / TIME: 202304131008
Unit Type and Rh: 5100

## 2021-04-26 ENCOUNTER — Other Ambulatory Visit: Payer: Self-pay | Admitting: Hematology

## 2021-04-26 DIAGNOSIS — C50911 Malignant neoplasm of unspecified site of right female breast: Secondary | ICD-10-CM

## 2021-04-28 ENCOUNTER — Encounter: Payer: Self-pay | Admitting: Hematology

## 2021-04-28 MED ORDER — HYDROMORPHONE HCL 2 MG PO TABS: 2.0000 mg | ORAL_TABLET | Freq: Four times a day (QID) | ORAL | 0 refills | Status: DC | PRN

## 2021-05-02 ENCOUNTER — Inpatient Hospital Stay: Payer: Medicaid Other

## 2021-05-08 ENCOUNTER — Other Ambulatory Visit: Payer: Self-pay

## 2021-05-08 DIAGNOSIS — D6481 Anemia due to antineoplastic chemotherapy: Secondary | ICD-10-CM

## 2021-05-09 ENCOUNTER — Encounter: Payer: Self-pay | Admitting: Nurse Practitioner

## 2021-05-09 ENCOUNTER — Inpatient Hospital Stay: Payer: Medicaid Other

## 2021-05-09 ENCOUNTER — Other Ambulatory Visit: Payer: Self-pay

## 2021-05-09 ENCOUNTER — Inpatient Hospital Stay (HOSPITAL_BASED_OUTPATIENT_CLINIC_OR_DEPARTMENT_OTHER): Payer: Medicaid Other | Admitting: Nurse Practitioner

## 2021-05-09 ENCOUNTER — Inpatient Hospital Stay (HOSPITAL_BASED_OUTPATIENT_CLINIC_OR_DEPARTMENT_OTHER): Payer: Medicaid Other | Admitting: Hematology

## 2021-05-09 ENCOUNTER — Telehealth: Payer: Self-pay

## 2021-05-09 ENCOUNTER — Encounter: Payer: Self-pay | Admitting: Hematology

## 2021-05-09 VITALS — BP 106/78 | HR 130 | Temp 97.8°F | Resp 17 | Wt 107.5 lb

## 2021-05-09 VITALS — BP 114/92 | HR 108 | Temp 97.7°F | Resp 18

## 2021-05-09 DIAGNOSIS — R630 Anorexia: Secondary | ICD-10-CM

## 2021-05-09 DIAGNOSIS — L299 Pruritus, unspecified: Secondary | ICD-10-CM | POA: Diagnosis not present

## 2021-05-09 DIAGNOSIS — G893 Neoplasm related pain (acute) (chronic): Secondary | ICD-10-CM | POA: Diagnosis not present

## 2021-05-09 DIAGNOSIS — Z7189 Other specified counseling: Secondary | ICD-10-CM

## 2021-05-09 DIAGNOSIS — R634 Abnormal weight loss: Secondary | ICD-10-CM | POA: Diagnosis not present

## 2021-05-09 DIAGNOSIS — C50911 Malignant neoplasm of unspecified site of right female breast: Secondary | ICD-10-CM

## 2021-05-09 DIAGNOSIS — C7951 Secondary malignant neoplasm of bone: Secondary | ICD-10-CM

## 2021-05-09 DIAGNOSIS — R531 Weakness: Secondary | ICD-10-CM

## 2021-05-09 DIAGNOSIS — Z5112 Encounter for antineoplastic immunotherapy: Secondary | ICD-10-CM | POA: Diagnosis not present

## 2021-05-09 DIAGNOSIS — R110 Nausea: Secondary | ICD-10-CM

## 2021-05-09 DIAGNOSIS — D6481 Anemia due to antineoplastic chemotherapy: Secondary | ICD-10-CM

## 2021-05-09 DIAGNOSIS — F32A Depression, unspecified: Secondary | ICD-10-CM

## 2021-05-09 DIAGNOSIS — Z95828 Presence of other vascular implants and grafts: Secondary | ICD-10-CM

## 2021-05-09 LAB — CMP (CANCER CENTER ONLY)
ALT: 12 U/L (ref 0–44)
AST: 34 U/L (ref 15–41)
Albumin: 3.9 g/dL (ref 3.5–5.0)
Alkaline Phosphatase: 101 U/L (ref 38–126)
Anion gap: 11 (ref 5–15)
BUN: 13 mg/dL (ref 6–20)
CO2: 25 mmol/L (ref 22–32)
Calcium: 10.6 mg/dL — ABNORMAL HIGH (ref 8.9–10.3)
Chloride: 100 mmol/L (ref 98–111)
Creatinine: 1.15 mg/dL — ABNORMAL HIGH (ref 0.44–1.00)
GFR, Estimated: 56 mL/min — ABNORMAL LOW (ref >60)
Glucose, Bld: 139 mg/dL — ABNORMAL HIGH (ref 70–99)
Potassium: 4 mmol/L (ref 3.5–5.1)
Sodium: 136 mmol/L (ref 135–145)
Total Bilirubin: 2.9 mg/dL — ABNORMAL HIGH (ref 0.3–1.2)
Total Protein: 6.8 g/dL (ref 6.5–8.1)

## 2021-05-09 LAB — CBC WITH DIFFERENTIAL (CANCER CENTER ONLY)
Abs Immature Granulocytes: 0.06 10*3/uL (ref 0.00–0.07)
Basophils Absolute: 0 10*3/uL (ref 0.0–0.1)
Basophils Relative: 1 %
Eosinophils Absolute: 0.1 10*3/uL (ref 0.0–0.5)
Eosinophils Relative: 2 %
HCT: 25.1 % — ABNORMAL LOW (ref 36.0–46.0)
Hemoglobin: 8.2 g/dL — ABNORMAL LOW (ref 12.0–15.0)
Immature Granulocytes: 2 %
Lymphocytes Relative: 18 %
Lymphs Abs: 0.7 10*3/uL (ref 0.7–4.0)
MCH: 28.5 pg (ref 26.0–34.0)
MCHC: 32.7 g/dL (ref 30.0–36.0)
MCV: 87.2 fL (ref 80.0–100.0)
Monocytes Absolute: 0.3 10*3/uL (ref 0.1–1.0)
Monocytes Relative: 8 %
Neutro Abs: 2.8 10*3/uL (ref 1.7–7.7)
Neutrophils Relative %: 69 %
Platelet Count: 68 10*3/uL — ABNORMAL LOW (ref 150–400)
RBC: 2.88 MIL/uL — ABNORMAL LOW (ref 3.87–5.11)
RDW: 17.9 % — ABNORMAL HIGH (ref 11.5–15.5)
WBC Count: 4 10*3/uL (ref 4.0–10.5)
nRBC: 0.7 % — ABNORMAL HIGH (ref 0.0–0.2)

## 2021-05-09 LAB — SAMPLE TO BLOOD BANK

## 2021-05-09 LAB — PREPARE RBC (CROSSMATCH)

## 2021-05-09 MED ORDER — LORATADINE 10 MG PO TABS
10.0000 mg | ORAL_TABLET | Freq: Every day | ORAL | Status: DC
  Administered 2021-05-09: 10 mg via ORAL
  Filled 2021-05-09: qty 1

## 2021-05-09 MED ORDER — HYDROXYZINE HCL 10 MG PO TABS: 10.0000 mg | ORAL_TABLET | Freq: Three times a day (TID) | ORAL | 1 refills | Status: DC | PRN

## 2021-05-09 MED ORDER — HEPARIN SOD (PORK) LOCK FLUSH 100 UNIT/ML IV SOLN
500.0000 [IU] | Freq: Once | INTRAVENOUS | Status: AC
  Administered 2021-05-09: 500 [IU] via INTRAVENOUS

## 2021-05-09 MED ORDER — LORAZEPAM 2 MG/ML IJ SOLN
0.2500 mg | Freq: Once | INTRAMUSCULAR | Status: DC
  Filled 2021-05-09: qty 1

## 2021-05-09 MED ORDER — SODIUM CHLORIDE 0.9% IV SOLUTION
250.0000 mL | Freq: Once | INTRAVENOUS | Status: AC
  Administered 2021-05-09: 250 mL via INTRAVENOUS

## 2021-05-09 MED ORDER — LORAZEPAM 2 MG/ML IJ SOLN
0.5000 mg | Freq: Once | INTRAMUSCULAR | Status: AC
  Administered 2021-05-09: 0.5 mg via INTRAVENOUS
  Filled 2021-05-09: qty 1

## 2021-05-09 MED ORDER — ACETAMINOPHEN 325 MG PO TABS
650.0000 mg | ORAL_TABLET | Freq: Once | ORAL | Status: AC
  Administered 2021-05-09: 650 mg via ORAL
  Filled 2021-05-09: qty 2

## 2021-05-09 MED ORDER — SODIUM CHLORIDE 0.9 % IV SOLN
10.0000 mg | Freq: Once | INTRAVENOUS | Status: AC
  Administered 2021-05-09: 10 mg via INTRAVENOUS
  Filled 2021-05-09: qty 10

## 2021-05-09 MED ORDER — SODIUM CHLORIDE 0.9% FLUSH
10.0000 mL | Freq: Once | INTRAVENOUS | Status: AC
  Administered 2021-05-09: 10 mL via INTRAVENOUS

## 2021-05-09 MED ORDER — HYDROCORTISONE 1 % EX OINT: 1.0000 "application " | TOPICAL_OINTMENT | Freq: Two times a day (BID) | CUTANEOUS | 0 refills | Status: DC

## 2021-05-09 MED ORDER — LORAZEPAM 2 MG/ML IJ SOLN
0.2500 mg | Freq: Once | INTRAMUSCULAR | Status: AC
  Administered 2021-05-09: 0.25 mg via INTRAVENOUS

## 2021-05-09 MED ORDER — FAMOTIDINE IN NACL 20-0.9 MG/50ML-% IV SOLN
20.0000 mg | Freq: Once | INTRAVENOUS | Status: AC
  Administered 2021-05-09: 20 mg via INTRAVENOUS
  Filled 2021-05-09: qty 50

## 2021-05-09 MED ORDER — BETAMETHASONE DIPROPIONATE 0.05 % EX CREA: TOPICAL_CREAM | Freq: Two times a day (BID) | CUTANEOUS | 0 refills | Status: DC

## 2021-05-09 MED ORDER — FENTANYL 25 MCG/HR TD PT72
1.0000 | MEDICATED_PATCH | TRANSDERMAL | 0 refills | Status: DC
Start: 2021-05-09 — End: 2021-05-20

## 2021-05-09 MED ORDER — OXYCODONE HCL 5 MG PO TABS: 5.0000 mg | ORAL_TABLET | ORAL | 0 refills | Status: DC | PRN

## 2021-05-09 MED ORDER — HYDROXYZINE HCL 25 MG PO TABS: 25.0000 mg | ORAL_TABLET | Freq: Four times a day (QID) | ORAL | 1 refills | Status: DC | PRN

## 2021-05-09 MED ORDER — ONDANSETRON HCL 4 MG/2ML IJ SOLN
4.0000 mg | Freq: Once | INTRAMUSCULAR | Status: AC
  Administered 2021-05-09: 4 mg via INTRAVENOUS
  Filled 2021-05-09: qty 2

## 2021-05-09 MED ORDER — HYDROXYZINE HCL 25 MG PO TABS
25.0000 mg | ORAL_TABLET | Freq: Once | ORAL | Status: AC
  Administered 2021-05-09: 25 mg via ORAL
  Filled 2021-05-09: qty 1

## 2021-05-09 NOTE — Progress Notes (Addendum)
? ?  ?Palliative Medicine ?[facility]  ?Telephone:(336) [phone] [phone] ? ? ?Name: [name] ?Date: 05/09/2021 ?MRN: [id number]  ?DOB: October 13, 1965 ? ?Patient Care Team: ?[name] as PCP - General (Physician Assistant) ?Debra Killings, MD as Consulting Physician (Orthopedic Surgery) ?Pickenpack-Cousar, Debra Sax, NP as Nurse Practitioner (Nurse Practitioner)  ? ? ?INTERVAL HISTORY: ?[name] is a 56 y.o. female with metastatic right breast (initial diagnosis 11/1997) s/p right mastectomy, chemotherapy, currently receiving vinorelbine, GERD, vertigo, congenital deafness, chemo-induced neuropathy, osteopenia, depression, anxiety, and cancer-related pain.  Palliative ask to see for symptom management and goals of care.  ? ?SOCIAL HISTORY:    ? reports that she has never smoked. She has never used smokeless tobacco. She reports that she does not drink alcohol and does not use drugs. ? ?ADVANCE DIRECTIVES:  ?Patient does not have a completed advanced directive. She understands her husband, [name] is considered HCPOA in the absence of such documents. Is aware of advanced directives clinic if she would like for [place] to make an appointment. ?  ? ?CODE STATUS: DNR ? ?PAST MEDICAL HISTORY: ?Past Medical History:  ?Diagnosis Date  ? Abnormal Pap smear   ? Anxiety   ? Breast cancer ([place])   ? Cancer [facility]) 1999  ? breast-s/p mastectomy, chemo, rad  ? CIN I (cervical intraepithelial neoplasia I) 2003  ? by colpo  ? Congenital deafness   ? Deaf   ? Depression   ? History of radiation therapy   ? Right femur, Left hip- 12/09/20-12/25/20- Dr. Gery Pray  ? History of radiation therapy   ? right pelvis/right femur 09/28/2017-10/11/2017  Dr Gery Pray  ? Port-A-Cath in place 2017  ? for chemotherapy  ? Radiation 07/12/15-07/26/15  ? left femur 30 Gy  ? S/P bilateral oophorectomy   ? ? ?ALLERGIES:  is allergic to promethazine hcl, chlorhexidine, and diphenhydramine  hcl. ? ?MEDICATIONS:  ?Current Outpatient Medications  ?Medication Sig Dispense Refill  ? fentaNYL (DURAGESIC) 25 MCG/HR Place 1 patch onto the skin every 3 (three) days. 10 patch 0  ? hydrocortisone 1 % ointment Apply 1 application. topically 2 (two) times daily. 30 g 0  ? hydrOXYzine (ATARAX) 10 MG tablet Take 1 tablet (10 mg total) by mouth 3 (three) times daily as needed. 30 tablet 1  ? oxyCODONE (OXY IR/ROXICODONE) 5 MG immediate release tablet Take 1-2 tablets (5-10 mg total) by mouth every 4 (four) hours as needed for severe pain or breakthrough pain. 30 tablet 0  ? dexamethasone (DECADRON) 4 MG tablet Take 1 tablet (4 mg total) by mouth daily. For 3-5 days after chemo (Patient not taking: Reported on 11/20/2020) 30 tablet 0  ? lidocaine (LIDODERM) 5 % Place 1 patch onto the skin daily. Remove & Discard patch within 12 hours or as directed by MD 30 patch 0  ? lidocaine-prilocaine (EMLA) cream Apply 1 application topically as needed. Apply to Porta-Cath 1-2 hours prior to access as directed. 90 g 2  ? LORazepam (ATIVAN) 0.5 MG tablet Take 1 tablet (0.5 mg total) by mouth every 8 (eight) hours as needed for anxiety (nausea). 30 tablet 0  ? megestrol (MEGACE) 40 MG tablet Take 1 tablet (40 mg total) by mouth daily. 30 tablet 1  ? potassium chloride (KLOR-CON) 10 MEQ tablet Take 1 tablet (10 mEq total) by mouth 3 (three) times daily. 90 tablet 2  ? prochlorperazine (COMPAZINE) 10 MG tablet TAKE 1 TABLET(10 MG) BY MOUTH EVERY 6 HOURS  AS NEEDED FOR NAUSEA OR VOMITING 60 tablet 1  ? zolpidem (AMBIEN) 10 MG tablet Take 1 tablet (10 mg total) by mouth at bedtime as needed for sleep. 30 tablet 0  ? ?No current facility-administered medications for this visit.  ? ?Facility-Administered Medications Ordered in Other Visits  ?Medication Dose Route Frequency Provider Last Rate Last Admin  ? potassium chloride SA (K-DUR) CR tablet 40 mEq  40 mEq Oral Once Debra Merle, MD      ? sodium chloride 0.9 % 1,000 mL with potassium  chloride 10 mEq infusion   Intravenous Continuous Debra Levan, MD   Stopped at 09/10/15 1653  ? sodium chloride 0.9 % injection 10 mL  10 mL Intravenous PRN Debra Levan, MD      ? ? ?VITAL SIGNS: ?BP 106/78 (BP Location: Left Arm, Patient Position: Sitting)   Pulse (!) 130 Comment: Nurse aware of patient pulse  Temp 97.8 ?F (36.6 ?C) (Oral)   Resp 17   Wt 107 lb 8 oz (48.8 kg)   SpO2 99%   BMI 17.35 kg/m?  ?Filed Weights  ? 05/09/21 0957  ?Weight: 107 lb 8 oz (48.8 kg)  ?  ?Estimated body mass index is 17.35 kg/m? as calculated from the following: ?  Height as of 12/12/20: '5\' 6"'$  (1.676 m). ?  Weight as of this encounter: 107 lb 8 oz (48.8 kg). ? ? ?PERFORMANCE STATUS (ECOG) : 2 - Symptomatic, <50% confined to bed ? ? ?Physical Exam ?General: NAD, cachectic, appears uncomfortable   ?Cardiovascular: regular rate and rhythm ?Pulmonary: clear ant fields ?Extremities: no edema, no joint deformities ?Skin: itching, no hives or welps noted, some redness to back and noted stratches ?Neurological:  AAOx3, fluent with sign-language interpretor  ? ?IMPRESSION: ? ?Mrs. [name] presents to clinic today for follow-up. Her husband, daughter-[name], and daughter from [place] is on facetime. Interpretor present. She appears uncomfortable complaining of ongoing itching for the past 1-2 weeks. Continues to lose weight. Reports ongoing nausea and vomiting. She is scheduled for a blood transfusion today and also seen by Dr. Burr Medico.  ? ?Pain ?Ongoing discussions regarding pain regimen. She was taking MS Contin ER at night and Dilaudid po for breakthrough. Unfortunately over the past 1-2 weeks she has developed severe pruritus which has not been relieved with home remedies. She has discontinued use of MS Contin and dilaudid more than 5 days ago however continues to have severe itching.  ? ?Pain has worsened in the setting of discontinued use of medications. We discussed at length changing current regimen. Education  provided on use of fentanyl patch with Oxycodone for breakthrough pain. Patient and family verbalized understanding.  ? ?Poor appetite/weight loss ?Appetite remains poor. States she is unable to eat much and when she does she gets nauseated. Her weight continues to decline. She is down to 107.8 lbs from   114.4lb 4/7, 118 lb 3/10, 120lbs 2/24, 127 lbs on 1/27.  ? ? ?Nausea  ?Ongoing nausea and vomiting. Reports compazine is effective when she takes it. Encouraged her to take around the clock as scheduled if nausea remains ongoing. She also has ativan which she has not used much. Education provided on use for nausea and also to assist with anxiety.  ? ?5. Goals of Care ? ?I created space and opportunity for Idil to express her feelings regarding ongoing decline and poor quality of life. She is emotional stating she is "scared" and does not want to die. Emotional support provided. She is  tearful expressing her ongoing weight loss and declining health.  ? ?I had an open and direct conversation with Fayetta and her family regarding her cancer progression, decreased quality of life, and recommendations to focus solely on her comfort. Family verbalized understanding. They were tearful in discussions. Emotional support provided.  ? ?Recommendations for outpatient hospice provided. Patient initially declined as she is adamant that she does not want anyone entering her home due to her 4 dogs.  We discussed the option of securing dogs in a location during hospice visits however she states this is not able to be done.  Shatyra continues to decline hospice options however her family openly expressed their acceptance and asking patient to consider.  Her husband expresses he wants the best care for her and support in the home to allow her to have a comfortable and peaceful quality of life for what time she has left.  After extensive discussions patient agreeable to hospice and being seen at her daughter [place].  [name] is  planning to take a leave of absence from work as she does have available PTO time.  Family plans to work closely together to provide the care she needs.  Daughter has provided her address so that hospice can arrange to come out. ?

## 2021-05-09 NOTE — Patient Instructions (Signed)
Blood Transfusion, Adult, Care After This sheet gives you information about how to care for yourself after your procedure. Your doctor may also give you more specific instructions. If you have problems or questions, contact your doctor. What can I expect after the procedure? After the procedure, it is common to have: Bruising and soreness at the IV site. A headache. Follow these instructions at home: Insertion site care     Follow instructions from your doctor about how to take care of your insertion site. This is where an IV tube was put into your vein. Make sure you: Wash your hands with soap and water before and after you change your bandage (dressing). If you cannot use soap and water, use hand sanitizer. Change your bandage as told by your doctor. Check your insertion site every day for signs of infection. Check for: Redness, swelling, or pain. Bleeding from the site. Warmth. Pus or a bad smell. General instructions Take over-the-counter and prescription medicines only as told by your doctor. Rest as told by your doctor. Go back to your normal activities as told by your doctor. Keep all follow-up visits as told by your doctor. This is important. Contact a doctor if: You have itching or red, swollen areas of skin (hives). You feel worried or nervous (anxious). You feel weak after doing your normal activities. You have redness, swelling, warmth, or pain around the insertion site. You have blood coming from the insertion site, and the blood does not stop with pressure. You have pus or a bad smell coming from the insertion site. Get help right away if: You have signs of a serious reaction. This may be coming from an allergy or the body's defense system (immune system). Signs include: Trouble breathing or shortness of breath. Swelling of the face or feeling warm (flushed). Fever or chills. Head, chest, or back pain. Dark pee (urine) or blood in the pee. Widespread rash. Fast  heartbeat. Feeling dizzy or light-headed. You may receive your blood transfusion in an outpatient setting. If so, you will be told whom to contact to report any reactions. These symptoms may be an emergency. Do not wait to see if the symptoms will go away. Get medical help right away. Call your local emergency services (911 in the [place] Do not drive yourself to the hospital. Summary Bruising and soreness at the IV site are common. Check your insertion site every day for signs of infection. Rest as told by your doctor. Go back to your normal activities as told by your doctor. Get help right away if you have signs of a serious reaction. This information is not intended to replace advice given to you by your health care provider. Make sure you discuss any questions you have with your health care provider. Document Revised: 04/25/2020 Document Reviewed: 06/23/2018 Elsevier Patient Education  2023 [employer]  

## 2021-05-09 NOTE — Telephone Encounter (Signed)
Orders confirmed by [place] in blood bank. ?

## 2021-05-09 NOTE — Progress Notes (Signed)
?[place]   ?Telephone:(336) [phone] [phone]   ?Clinic Follow up Note  ? ?Patient Care Team: ?[name], Margaretha Seeds as PCP - General (Physician Assistant) ?[name], MD as Consulting Physician (Orthopedic Surgery) ?Pickenpack-Cousar, Carlena Sax, NP as Nurse Practitioner (Nurse Practitioner) ? ?Date of Service:  05/09/2021 ? ?CHIEF COMPLAINT: f/u of metastatic breast cancer ? ?CURRENT THERAPY:  ?Supportive Care ? ?ASSESSMENT & PLAN:  ?[name] is a 56 y.o. female with  ? ?1. 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 She had right mastectomy, 4 cycles of AC-T and 5 years of Tamoxifen/AI. ?-In 06/2015 she developed bone metastases. ?-she has had multiple endocrine therapy and chemotherapy, overall does not tolerate treatment well and frequently required dose reduction ?-We switched her treatment to CMF and Keytruda on 08/02/20, and continued herceptin. Due to poor tolerance, I dropped the methotrexate and fluorouracil and delayed her third cycle to 09/20/20, so she received cytoxan, Beryle Flock, and herceptin. She tolerated much better with just some fatigue and mild headaches. ?-restaging PET 10/31/20 unfortunately showed significantly worsening disease. She was switched to trastuzumab and vinorelbine on 11/01/20. She tolerated moderately well with nausea and fatigue, dose reduced after cycle 1  ?-PET scan 02/04/21 showed overall stable disease with some new, small mediastinal and hilar lymph nodes. Her tumor marker is stable, plan to continue chemo  ?-we changed her treatment to once every 2 weeks starting 02/07/21 due to her fatigue and overall low PS ?-she continues to worsen despite our best efforts to control her cancer.  Due to her worsening cytopenias, liver function, and poor functional status, I will stop her chemotherapy.  I recommend home hospice.  Our palliative care nurse  [name] discussed hospice again with them today. They are agreeable now, but [name] would like hospice to come to her daughter's house instead. We will make these arrangements for her and continue to manage her symptoms. ?-Her life expectancy is likely a few months ?  ?2. Symptom Management: nausea and vomiting with weight loss, depression with fatigue, body itching ?-with her increased bone pains, she has had more nausea and vomiting. Because of this, she has lost weight.  ?-she did not tolerate Boost or Ensure. We previously reviewed other options.  ?-she reports itching, which I discussed is likely related to her rising bilirubin. She does not tolerate benadryl (more itching) and creams have been ineffective. ?  ?3. Body pain, secondary to bone mets   ?-She is being treated with Zometa q63month. ?-She will continue oral calcium and Vit D and Prilosec for Acid Reflux.  ?-She has received palliative radiation therapy to the left femur (2017), right sacroiliac area (2019), and right femur and left hip (11/28-12/14/22) under Dr. [name] ?-she currently has MS contin and dilaudid for pain, which she feels is more helpful than the oxycodone. She recently developed itching, which she feels is related to these, despite having taken these prior to the itching. ?  ?4. Goal of care discussion, DNR/DNI ?-We previously reviewed goal of care and I recommend DNR/DNI, she agreed again 07/19/20. ?-I again encouraged her to start setting up her living will. Her husband Debra Carolinais automatically her HCPOA.  ?-Though her care is palliative, our goal is to prolong her life. She again states she would like to continue treatment. ?-she declines home palliative care as she does not want a stranger to come into her  home. ?  ?5. Congenital deafness ?  ?6.  Anemia and thrombocytopenia ?-Secondary to metastatic disease to bones, previous chemotherapy ?-Hemoglobin 8.2 today, will proceed with blood transfusion today. ?  ?7. Neuropathy  G1 ?-Secondary to chemo. Mild, only in her feet. ?-worsened with CMF, remains mild off treatment. ?  ?  ?PLAN:   ?-proceed with blood transfusion today ?-pain medication and symptom management per NP [name] ?-home hospice referral ?-I will see her as needed. ? ? ?No problem-specific Assessment & Plan notes found for this encounter. ? ? ?SUMMARY OF ONCOLOGIC HISTORY: ?Oncology History Overview Note  ? Cancer Staging ?No matching staging information was found for the patient. ? ? ?  ?[name], right ([place])  ?11/1997 Cancer Diagnosis  ? Patient had 1.4 cm poorly differentiated right breast carcinoma diagnosed in Nov 1999 at age 68, 1/7 axillary nodes involved, ER/PR and HER 2 positive. She had mastectomy with the 7 axillary node evaluation, 4 cycles of adriamycin/ cytoxan followed by taxotere, then five years of tamoxifen thru June 2005 (no herceptin in 1999). She had bilateral oophorectomy in May 2005. She was briefly on aromatase inhibitor after tamoxifen, but discontinued this herself due to poor tolerance. ? ?  ?04/04/2015 Genetic Testing  ? Negative genetic testing on the breast/ovarian cancer pnael.  The Breast/Ovarian gene panel offered by [facility] includes sequencing and rearrangement analysis for the following 20 genes:  ATM, BARD1, BRCA1, BRCA2, BRIP1, CDH1, CHEK2, EPCAM, FANCC, MLH1, MSH2, MSH6, NBN, PALB2, PMS2, PTEN, RAD51C, RAD51D, TP53, and XRCC2. ?  ?06/26/2015 Pathology Results  ? Initial Path Report ?Bone, curettage, Left femur met, intramedullary subtroch tissue ?- METASTATIC ADENOCARCINOMA. ? ?Estrogen Receptor: 95%, POSITIVE, STRONG STAINING INTENSITY ?Progesterone Receptor: 2%, POSITIVE, STRONG  ?HER2 (+), IHC 3+ ? ?  ?06/26/2015 Surgery  ? Biopsy of metastatic tissue and stabilization with Affixus ?trochanteric nail, proximal and distal interlock of left femur by Dr. [name]  ? ?  ?06/28/2015 Imaging  ? CT Chest w/ Contrast ?1. Extensive right internal mammary chain  lymphadenopathy consistent with metastatic breast cancer. ?2. Lytic metastases involving the posterior elements at T1 with probable nondisplaced pathologic fracture of the spinous process. ?3. No other evidence of thoracic metastatic disease. ?4. Trace bilateral pleural effusions with associated bibasilar ?atelectasis. ?5. Indeterminate right thyroid nodule, not previously imaged. This could be evaluated with thyroid ultrasound as clinically warranted. ? ?  ?06/29/2015 -  Chemotherapy  ? Zometa started monthly on 06/29/15 and switched to q24month from 01/08/17 ?  ?07/01/2015 Imaging  ? Bone Scan ?1. Increased activity noted throughout the left femur. Although metastatic disease cannot be excluded, these changes are most likely secondary to prior surgery. ?2. No other focal abnormalities identified to suggest metastatic disease. ? ?  ?07/12/2015 - 07/26/2015 Radiation Therapy  ? Left femur, 30 Gy in 10 fractions by Dr. [name] ? ?  ?07/14/2015 Initial Diagnosis  ? [name] ? ?  ?07/19/2015 Imaging  ? Initial PET Scan ?1. Severe multifocal osseous metastatic disease, much greater than anticipated based on prior imaging. ?2. Multifocal nodal metastases involving the right internal mammary, prevascular and subpectoral lymph nodes. No axillary pulmonary involvement identified. ?3. No distant extra osseous metastases. ? ?  ?07/30/2015 - 11/29/2015 Chemotherapy  ? Docetaxel, Herceptin and Perjeta every 3 weeks X6 cycles, pt tolerated moderately well, restaging scan showed excellent response  ? ?  ?09/18/2015 Imaging  ? CT Head w/wo Contrast ?1. No acute intracranial  abnormality or significant interval change. ?2. Stable minimal periventricular white matter hypoattenuation on the right. This may reflect a remote ischemic injury. ?3. No evidence for metastatic disease to the brain. ?4. No focal soft tissue lesion to explain the patient's tenderness. ? ?  ?10/30/2015 Imaging  ? CT Abdomen Pelvis  w/ Contrast ?1. Few mildly prominent fluid-filled loops of small bowel scattered throughout the abdomen, with intraluminal fluid density within the distal colon. Given the provided history, findings are suggestive of acut

## 2021-05-09 NOTE — Addendum Note (Signed)
Addended by: Jimmy Footman on: 05/09/2021 03:21 PM ? ? Modules accepted: Orders ? ?

## 2021-05-10 ENCOUNTER — Other Ambulatory Visit: Payer: Self-pay | Admitting: Nurse Practitioner

## 2021-05-10 ENCOUNTER — Encounter: Payer: Self-pay | Admitting: Nurse Practitioner

## 2021-05-10 LAB — TYPE AND SCREEN
ABO/RH(D): O POS
Antibody Screen: NEGATIVE
Unit division: 0

## 2021-05-10 LAB — BPAM RBC
Blood Product Expiration Date: 202305262359
ISSUE DATE / TIME: 202304281220
Unit Type and Rh: 5100

## 2021-05-10 LAB — CANCER ANTIGEN 27.29: CA 27.29: 161.1 U/mL — ABNORMAL HIGH (ref 0.0–38.6)

## 2021-05-10 MED ORDER — BETAMETHASONE VALERATE 0.1 % EX OINT: 1.0000 "application " | TOPICAL_OINTMENT | Freq: Two times a day (BID) | CUTANEOUS | 1 refills | Status: DC

## 2021-05-13 DIAGNOSIS — T451X5D Adverse effect of antineoplastic and immunosuppressive drugs, subsequent encounter: Secondary | ICD-10-CM | POA: Diagnosis not present

## 2021-05-13 DIAGNOSIS — C50911 Malignant neoplasm of unspecified site of right female breast: Secondary | ICD-10-CM | POA: Diagnosis not present

## 2021-05-13 DIAGNOSIS — R42 Dizziness and giddiness: Secondary | ICD-10-CM | POA: Diagnosis not present

## 2021-05-13 DIAGNOSIS — F32A Depression, unspecified: Secondary | ICD-10-CM | POA: Diagnosis not present

## 2021-05-13 DIAGNOSIS — Z9011 Acquired absence of right breast and nipple: Secondary | ICD-10-CM | POA: Diagnosis not present

## 2021-05-13 DIAGNOSIS — M8589 Other specified disorders of bone density and structure, multiple sites: Secondary | ICD-10-CM | POA: Diagnosis not present

## 2021-05-13 DIAGNOSIS — K219 Gastro-esophageal reflux disease without esophagitis: Secondary | ICD-10-CM | POA: Diagnosis not present

## 2021-05-13 DIAGNOSIS — C7951 Secondary malignant neoplasm of bone: Secondary | ICD-10-CM | POA: Diagnosis not present

## 2021-05-13 DIAGNOSIS — E46 Unspecified protein-calorie malnutrition: Secondary | ICD-10-CM | POA: Diagnosis not present

## 2021-05-13 DIAGNOSIS — H913 Deaf nonspeaking, not elsewhere classified: Secondary | ICD-10-CM | POA: Diagnosis not present

## 2021-05-13 DIAGNOSIS — G620 Drug-induced polyneuropathy: Secondary | ICD-10-CM | POA: Diagnosis not present

## 2021-05-14 DIAGNOSIS — C50911 Malignant neoplasm of unspecified site of right female breast: Secondary | ICD-10-CM | POA: Diagnosis not present

## 2021-05-14 DIAGNOSIS — E46 Unspecified protein-calorie malnutrition: Secondary | ICD-10-CM | POA: Diagnosis not present

## 2021-05-14 DIAGNOSIS — T451X5D Adverse effect of antineoplastic and immunosuppressive drugs, subsequent encounter: Secondary | ICD-10-CM | POA: Diagnosis not present

## 2021-05-14 DIAGNOSIS — H913 Deaf nonspeaking, not elsewhere classified: Secondary | ICD-10-CM | POA: Diagnosis not present

## 2021-05-14 DIAGNOSIS — M8589 Other specified disorders of bone density and structure, multiple sites: Secondary | ICD-10-CM | POA: Diagnosis not present

## 2021-05-14 DIAGNOSIS — C7951 Secondary malignant neoplasm of bone: Secondary | ICD-10-CM | POA: Diagnosis not present

## 2021-05-14 DIAGNOSIS — G620 Drug-induced polyneuropathy: Secondary | ICD-10-CM | POA: Diagnosis not present

## 2021-05-14 DIAGNOSIS — F32A Depression, unspecified: Secondary | ICD-10-CM | POA: Diagnosis not present

## 2021-05-14 DIAGNOSIS — R42 Dizziness and giddiness: Secondary | ICD-10-CM | POA: Diagnosis not present

## 2021-05-14 DIAGNOSIS — K219 Gastro-esophageal reflux disease without esophagitis: Secondary | ICD-10-CM | POA: Diagnosis not present

## 2021-05-14 DIAGNOSIS — Z9011 Acquired absence of right breast and nipple: Secondary | ICD-10-CM | POA: Diagnosis not present

## 2021-05-15 DIAGNOSIS — M8589 Other specified disorders of bone density and structure, multiple sites: Secondary | ICD-10-CM | POA: Diagnosis not present

## 2021-05-15 DIAGNOSIS — F32A Depression, unspecified: Secondary | ICD-10-CM | POA: Diagnosis not present

## 2021-05-15 DIAGNOSIS — G620 Drug-induced polyneuropathy: Secondary | ICD-10-CM | POA: Diagnosis not present

## 2021-05-15 DIAGNOSIS — H913 Deaf nonspeaking, not elsewhere classified: Secondary | ICD-10-CM | POA: Diagnosis not present

## 2021-05-15 DIAGNOSIS — C50911 Malignant neoplasm of unspecified site of right female breast: Secondary | ICD-10-CM | POA: Diagnosis not present

## 2021-05-15 DIAGNOSIS — Z9011 Acquired absence of right breast and nipple: Secondary | ICD-10-CM | POA: Diagnosis not present

## 2021-05-15 DIAGNOSIS — K219 Gastro-esophageal reflux disease without esophagitis: Secondary | ICD-10-CM | POA: Diagnosis not present

## 2021-05-15 DIAGNOSIS — R42 Dizziness and giddiness: Secondary | ICD-10-CM | POA: Diagnosis not present

## 2021-05-15 DIAGNOSIS — T451X5D Adverse effect of antineoplastic and immunosuppressive drugs, subsequent encounter: Secondary | ICD-10-CM | POA: Diagnosis not present

## 2021-05-15 DIAGNOSIS — E46 Unspecified protein-calorie malnutrition: Secondary | ICD-10-CM | POA: Diagnosis not present

## 2021-05-15 DIAGNOSIS — C7951 Secondary malignant neoplasm of bone: Secondary | ICD-10-CM | POA: Diagnosis not present

## 2021-05-16 DIAGNOSIS — C50911 Malignant neoplasm of unspecified site of right female breast: Secondary | ICD-10-CM | POA: Diagnosis not present

## 2021-05-16 DIAGNOSIS — Z9011 Acquired absence of right breast and nipple: Secondary | ICD-10-CM | POA: Diagnosis not present

## 2021-05-16 DIAGNOSIS — T451X5D Adverse effect of antineoplastic and immunosuppressive drugs, subsequent encounter: Secondary | ICD-10-CM | POA: Diagnosis not present

## 2021-05-16 DIAGNOSIS — F32A Depression, unspecified: Secondary | ICD-10-CM | POA: Diagnosis not present

## 2021-05-16 DIAGNOSIS — M8589 Other specified disorders of bone density and structure, multiple sites: Secondary | ICD-10-CM | POA: Diagnosis not present

## 2021-05-16 DIAGNOSIS — G620 Drug-induced polyneuropathy: Secondary | ICD-10-CM | POA: Diagnosis not present

## 2021-05-16 DIAGNOSIS — K219 Gastro-esophageal reflux disease without esophagitis: Secondary | ICD-10-CM | POA: Diagnosis not present

## 2021-05-16 DIAGNOSIS — H913 Deaf nonspeaking, not elsewhere classified: Secondary | ICD-10-CM | POA: Diagnosis not present

## 2021-05-16 DIAGNOSIS — C7951 Secondary malignant neoplasm of bone: Secondary | ICD-10-CM | POA: Diagnosis not present

## 2021-05-16 DIAGNOSIS — E46 Unspecified protein-calorie malnutrition: Secondary | ICD-10-CM | POA: Diagnosis not present

## 2021-05-16 DIAGNOSIS — R42 Dizziness and giddiness: Secondary | ICD-10-CM | POA: Diagnosis not present

## 2021-05-17 ENCOUNTER — Inpatient Hospital Stay (HOSPITAL_COMMUNITY)
Admission: EM | Admit: 2021-05-17 | Discharge: 2021-05-20 | DRG: 947 | Disposition: A | Discharge status: Hospice | Attending: Family Medicine | Admitting: Family Medicine

## 2021-05-17 ENCOUNTER — Encounter (HOSPITAL_COMMUNITY): Payer: Self-pay | Admitting: Internal Medicine

## 2021-05-17 ENCOUNTER — Emergency Department (HOSPITAL_COMMUNITY)

## 2021-05-17 ENCOUNTER — Other Ambulatory Visit: Payer: Self-pay

## 2021-05-17 DIAGNOSIS — C7951 Secondary malignant neoplasm of bone: Secondary | ICD-10-CM | POA: Diagnosis not present

## 2021-05-17 DIAGNOSIS — Z515 Encounter for palliative care: Secondary | ICD-10-CM | POA: Diagnosis not present

## 2021-05-17 DIAGNOSIS — Z170 Estrogen receptor positive status [ER+]: Secondary | ICD-10-CM

## 2021-05-17 DIAGNOSIS — E43 Unspecified severe protein-calorie malnutrition: Secondary | ICD-10-CM | POA: Diagnosis present

## 2021-05-17 DIAGNOSIS — K5900 Constipation, unspecified: Secondary | ICD-10-CM | POA: Diagnosis present

## 2021-05-17 DIAGNOSIS — M8589 Other specified disorders of bone density and structure, multiple sites: Secondary | ICD-10-CM | POA: Diagnosis not present

## 2021-05-17 DIAGNOSIS — Z7189 Other specified counseling: Secondary | ICD-10-CM

## 2021-05-17 DIAGNOSIS — Z9011 Acquired absence of right breast and nipple: Secondary | ICD-10-CM

## 2021-05-17 DIAGNOSIS — G620 Drug-induced polyneuropathy: Secondary | ICD-10-CM | POA: Diagnosis not present

## 2021-05-17 DIAGNOSIS — C7981 Secondary malignant neoplasm of breast: Secondary | ICD-10-CM | POA: Diagnosis not present

## 2021-05-17 DIAGNOSIS — F419 Anxiety disorder, unspecified: Secondary | ICD-10-CM | POA: Diagnosis present

## 2021-05-17 DIAGNOSIS — E46 Unspecified protein-calorie malnutrition: Secondary | ICD-10-CM | POA: Diagnosis not present

## 2021-05-17 DIAGNOSIS — R9431 Abnormal electrocardiogram [ECG] [EKG]: Secondary | ICD-10-CM | POA: Diagnosis not present

## 2021-05-17 DIAGNOSIS — C50911 Malignant neoplasm of unspecified site of right female breast: Secondary | ICD-10-CM | POA: Diagnosis not present

## 2021-05-17 DIAGNOSIS — R52 Pain, unspecified: Secondary | ICD-10-CM | POA: Diagnosis present

## 2021-05-17 DIAGNOSIS — L299 Pruritus, unspecified: Secondary | ICD-10-CM | POA: Diagnosis present

## 2021-05-17 DIAGNOSIS — R0789 Other chest pain: Secondary | ICD-10-CM | POA: Diagnosis not present

## 2021-05-17 DIAGNOSIS — I959 Hypotension, unspecified: Secondary | ICD-10-CM | POA: Diagnosis not present

## 2021-05-17 DIAGNOSIS — E860 Dehydration: Secondary | ICD-10-CM | POA: Diagnosis present

## 2021-05-17 DIAGNOSIS — Z888 Allergy status to other drugs, medicaments and biological substances status: Secondary | ICD-10-CM

## 2021-05-17 DIAGNOSIS — R42 Dizziness and giddiness: Secondary | ICD-10-CM | POA: Diagnosis not present

## 2021-05-17 DIAGNOSIS — G4700 Insomnia, unspecified: Secondary | ICD-10-CM | POA: Diagnosis present

## 2021-05-17 DIAGNOSIS — F32A Depression, unspecified: Secondary | ICD-10-CM | POA: Diagnosis not present

## 2021-05-17 DIAGNOSIS — K219 Gastro-esophageal reflux disease without esophagitis: Secondary | ICD-10-CM | POA: Diagnosis not present

## 2021-05-17 DIAGNOSIS — Z681 Body mass index (BMI) 19 or less, adult: Secondary | ICD-10-CM

## 2021-05-17 DIAGNOSIS — Z79899 Other long term (current) drug therapy: Secondary | ICD-10-CM

## 2021-05-17 DIAGNOSIS — G893 Neoplasm related pain (acute) (chronic): Secondary | ICD-10-CM | POA: Diagnosis not present

## 2021-05-17 DIAGNOSIS — I2510 Atherosclerotic heart disease of native coronary artery without angina pectoris: Secondary | ICD-10-CM | POA: Diagnosis present

## 2021-05-17 DIAGNOSIS — H905 Unspecified sensorineural hearing loss: Secondary | ICD-10-CM | POA: Diagnosis present

## 2021-05-17 DIAGNOSIS — Z9221 Personal history of antineoplastic chemotherapy: Secondary | ICD-10-CM

## 2021-05-17 DIAGNOSIS — H913 Deaf nonspeaking, not elsewhere classified: Secondary | ICD-10-CM | POA: Diagnosis not present

## 2021-05-17 DIAGNOSIS — R079 Chest pain, unspecified: Secondary | ICD-10-CM | POA: Diagnosis not present

## 2021-05-17 DIAGNOSIS — T451X5D Adverse effect of antineoplastic and immunosuppressive drugs, subsequent encounter: Secondary | ICD-10-CM | POA: Diagnosis not present

## 2021-05-17 DIAGNOSIS — Z66 Do not resuscitate: Secondary | ICD-10-CM | POA: Diagnosis present

## 2021-05-17 DIAGNOSIS — Z923 Personal history of irradiation: Secondary | ICD-10-CM

## 2021-05-17 DIAGNOSIS — D6481 Anemia due to antineoplastic chemotherapy: Secondary | ICD-10-CM | POA: Diagnosis present

## 2021-05-17 DIAGNOSIS — D61810 Antineoplastic chemotherapy induced pancytopenia: Secondary | ICD-10-CM | POA: Diagnosis present

## 2021-05-17 DIAGNOSIS — T451X5A Adverse effect of antineoplastic and immunosuppressive drugs, initial encounter: Secondary | ICD-10-CM | POA: Diagnosis present

## 2021-05-17 LAB — BASIC METABOLIC PANEL
Anion gap: 9 (ref 5–15)
BUN: 7 mg/dL (ref 6–20)
CO2: 24 mmol/L (ref 22–32)
Calcium: 10 mg/dL (ref 8.9–10.3)
Chloride: 104 mmol/L (ref 98–111)
Creatinine, Ser: 0.93 mg/dL (ref 0.44–1.00)
GFR, Estimated: >60 mL/min (ref >60)
Glucose, Bld: 104 mg/dL — ABNORMAL HIGH (ref 70–99)
Potassium: 3.4 mmol/L — ABNORMAL LOW (ref 3.5–5.1)
Sodium: 137 mmol/L (ref 135–145)

## 2021-05-17 LAB — CBC
HCT: 22.6 % — ABNORMAL LOW (ref 36.0–46.0)
Hemoglobin: 7.9 g/dL — ABNORMAL LOW (ref 12.0–15.0)
MCH: 30.5 pg (ref 26.0–34.0)
MCHC: 35 g/dL (ref 30.0–36.0)
MCV: 87.3 fL (ref 80.0–100.0)
Platelets: 57 10*3/uL — ABNORMAL LOW (ref 150–400)
RBC: 2.59 MIL/uL — ABNORMAL LOW (ref 3.87–5.11)
RDW: 16.9 % — ABNORMAL HIGH (ref 11.5–15.5)
WBC: 2.5 10*3/uL — ABNORMAL LOW (ref 4.0–10.5)
nRBC: 0 % (ref 0.0–0.2)

## 2021-05-17 LAB — HEPATIC FUNCTION PANEL
ALT: 12 U/L (ref 0–44)
AST: 28 U/L (ref 15–41)
Albumin: 2.8 g/dL — ABNORMAL LOW (ref 3.5–5.0)
Alkaline Phosphatase: 85 U/L (ref 38–126)
Bilirubin, Direct: 0.4 mg/dL — ABNORMAL HIGH (ref 0.0–0.2)
Indirect Bilirubin: 1.9 mg/dL — ABNORMAL HIGH (ref 0.3–0.9)
Total Bilirubin: 2.3 mg/dL — ABNORMAL HIGH (ref 0.3–1.2)
Total Protein: 5.5 g/dL — ABNORMAL LOW (ref 6.5–8.1)

## 2021-05-17 LAB — TROPONIN I (HIGH SENSITIVITY): Troponin I (High Sensitivity): 5 ng/L (ref <18)

## 2021-05-17 MED ORDER — MEGESTROL ACETATE 40 MG PO TABS
40.0000 mg | ORAL_TABLET | Freq: Every day | ORAL | Status: DC
  Administered 2021-05-18 – 2021-05-20 (×3): 40 mg via ORAL
  Filled 2021-05-17 (×3): qty 1

## 2021-05-17 MED ORDER — PENTAFLUOROPROP-TETRAFLUOROETH EX AERO
INHALATION_SPRAY | CUTANEOUS | Status: DC | PRN
  Filled 2021-05-17: qty 116

## 2021-05-17 MED ORDER — ZOLPIDEM TARTRATE 5 MG PO TABS
10.0000 mg | ORAL_TABLET | Freq: Every evening | ORAL | Status: DC | PRN
  Administered 2021-05-17 – 2021-05-19 (×3): 10 mg via ORAL
  Filled 2021-05-17 (×3): qty 2

## 2021-05-17 MED ORDER — OXYCODONE HCL 5 MG PO TABS: 5.0000 mg | ORAL_TABLET | ORAL | Status: DC | PRN

## 2021-05-17 MED ORDER — MORPHINE SULFATE (PF) 4 MG/ML IV SOLN
4.0000 mg | Freq: Four times a day (QID) | INTRAVENOUS | Status: DC | PRN
  Administered 2021-05-17 – 2021-05-18 (×2): 4 mg via INTRAVENOUS
  Filled 2021-05-17 (×2): qty 1

## 2021-05-17 MED ORDER — SODIUM CHLORIDE 0.9% FLUSH
10.0000 mL | Freq: Two times a day (BID) | INTRAVENOUS | Status: DC
  Administered 2021-05-18 – 2021-05-19 (×2): 10 mL

## 2021-05-17 MED ORDER — TRIAMCINOLONE ACETONIDE 0.1 % EX CREA
TOPICAL_CREAM | Freq: Two times a day (BID) | CUTANEOUS | Status: DC
Start: 2021-05-17 — End: 2021-05-20
  Filled 2021-05-17: qty 15

## 2021-05-17 MED ORDER — MORPHINE SULFATE (PF) 4 MG/ML IV SOLN: 4.0000 mg | Freq: Once | INTRAVENOUS | Status: DC

## 2021-05-17 MED ORDER — LIDOCAINE 5 % EX PTCH: 1.0000 | MEDICATED_PATCH | CUTANEOUS | Status: DC

## 2021-05-17 MED ORDER — HYDROMORPHONE HCL 1 MG/ML IJ SOLN: 0.5000 mg | INTRAMUSCULAR | Status: DC | PRN

## 2021-05-17 MED ORDER — PROCHLORPERAZINE MALEATE 5 MG PO TABS
5.0000 mg | ORAL_TABLET | Freq: Four times a day (QID) | ORAL | Status: DC | PRN
  Administered 2021-05-20: 5 mg via ORAL
  Filled 2021-05-17 (×3): qty 1

## 2021-05-17 MED ORDER — FENTANYL CITRATE PF 50 MCG/ML IJ SOSY
50.0000 ug | PREFILLED_SYRINGE | Freq: Once | INTRAMUSCULAR | Status: DC
  Filled 2021-05-17: qty 1

## 2021-05-17 MED ORDER — HYDROMORPHONE HCL 2 MG PO TABS: 4.0000 mg | ORAL_TABLET | ORAL | Status: DC | PRN

## 2021-05-17 MED ORDER — HYDROMORPHONE HCL 1 MG/ML IJ SOLN: 0.5000 mg | Freq: Three times a day (TID) | INTRAMUSCULAR | Status: DC

## 2021-05-17 MED ORDER — SODIUM CHLORIDE 0.9 % IV SOLN: INTRAVENOUS | Status: AC

## 2021-05-17 MED ORDER — POTASSIUM CHLORIDE CRYS ER 10 MEQ PO TBCR
10.0000 meq | EXTENDED_RELEASE_TABLET | Freq: Three times a day (TID) | ORAL | Status: DC
  Administered 2021-05-17 – 2021-05-20 (×8): 10 meq via ORAL
  Filled 2021-05-17 (×12): qty 1

## 2021-05-17 MED ORDER — HYDROCORTISONE 1 % EX OINT
1.0000 "application " | TOPICAL_OINTMENT | Freq: Two times a day (BID) | CUTANEOUS | Status: DC
  Administered 2021-05-18 – 2021-05-19 (×4): 1 via TOPICAL
  Filled 2021-05-17: qty 28

## 2021-05-17 MED ORDER — LIDOCAINE 5 % EX PTCH
3.0000 | MEDICATED_PATCH | CUTANEOUS | Status: DC
  Filled 2021-05-17: qty 3

## 2021-05-17 MED ORDER — LORAZEPAM 0.5 MG PO TABS: 0.5000 mg | ORAL_TABLET | ORAL | Status: DC | PRN

## 2021-05-17 MED ORDER — LORAZEPAM 1 MG PO TABS: 0.5000 mg | ORAL_TABLET | Freq: Three times a day (TID) | ORAL | Status: DC | PRN

## 2021-05-17 MED ORDER — HYDROXYZINE HCL 25 MG PO TABS
25.0000 mg | ORAL_TABLET | Freq: Four times a day (QID) | ORAL | Status: DC | PRN
  Administered 2021-05-18 (×4): 25 mg via ORAL
  Filled 2021-05-17 (×4): qty 1

## 2021-05-17 MED ORDER — DICLOFENAC SODIUM 1 % EX GEL
4.0000 g | Freq: Two times a day (BID) | CUTANEOUS | Status: DC
  Administered 2021-05-17 – 2021-05-20 (×6): 4 g via TOPICAL
  Filled 2021-05-17: qty 100

## 2021-05-17 MED ORDER — ENSURE ENLIVE PO LIQD
237.0000 mL | Freq: Two times a day (BID) | ORAL | Status: DC
  Filled 2021-05-17: qty 237

## 2021-05-17 NOTE — Progress Notes (Signed)
Manufacturing engineer [facility]) Hospital Liaison Note ? ?This is a current hospice patient with [facility]. Please call with any questions or concerns. We will follow through the discharge.  ? ?Roselee Nova, LCSW ?[name] Liaison ?[phone] ?

## 2021-05-17 NOTE — Progress Notes (Signed)
Pt arrived to the unit. VS WDL. Pt received her late lunch. States that she is very cold. Warm blankets are given. Pain is better per pt. Dinner is ordered. Call bell within reach.  ?

## 2021-05-17 NOTE — Consult Note (Signed)
?Palliative Medicine Inpatient Consult Note ? ?Consulting Provider: Lequita Halt, MD ? ?Reason for consult:   ?Palliative Care Consult Services Symptom Management Consult  ?Reason for Consult? pain control  ? ?HPI:  ?Per intake H&P -->  ?[name] is a 56 y.o. 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 ? ?Patient is active with [facility]. A liaison will see her tomorrow.  ? ?In the meanwhile the inpatient Palliative care team has been requested to support pain management. ? ?Clinical Assessment/Goals of Care: ? ?*Please note that this is a verbal dictation therefore any spelling or grammatical errors are due to the "[facility]" system interpretation. ? ?I have reviewed medical records including EPIC notes, labs and imaging, received report from bedside RN, assessed the patient.  ?  ?I met with [name] and her spouse, [name] to further discuss diagnosis prognosis, GOC, EOL wishes, disposition and options. ?  ?I introduced Palliative Medicine as specialized medical care for people living with serious illness. It focuses on providing relief from the symptoms and stress of a serious illness. The goal is to improve quality of life for both the patient and the family. ? ?Medical History Review and Understanding: ? ?A formal review of patient's past medical history was had in the setting of her metastatic breast cancer. ? ?Social History: ? ?Has been married for 17 years to spouse, [name]. [name] lives in the home with her daughter, 3 granddaughters, 4 dogs, and 1 cat.  She also has another daughter and granddaughter who reside in [place]. ? ?Functional and Nutritional State: ? ?She is  ambulatory in the home utilizing a rollator for mobility.  She has had 2 falls within the last week - one on last Saturday whereby she hit her back and one on Monday whereby she hit her left shoulder and chest.  Patient's daughter helps with all B ADLs at this point in time. ? ?[name] has had an ongoing poor appetite. ? ?Palliative Symptoms: ? ?Chest pain- this is a pressure which is constant. ? ?Insomnia-was on Ambien at one point which did help. ? ?Denies nausea, dyspnea. ? ?Advance Directives: ? ?A detailed discussion was had today regarding advanced directives.  There are none in [place] though her husband [name] is her surrogate Media planner. ? ?Code Status: ? ?[name] is an established DNAR/DNI. ? ?Discussion: ? ?Discussed the patient's pain has been worse since Monday after she fell and hit her shoulder and chest.  We reviewed that the pain may be related to musculoskeletal injury. We reviewed a more comprehensive plan to control pain. Discussed the plan to continue hospice care once out of the hospital.  ? ?Discussed the importance of continued conversation with family and their  medical providers regarding overall plan of care and treatment options, ensuring decisions are within the context of the patients values and GOCs. ? ?Decision Maker: ?[name] (spouse): [phone] ? ?SUMMARY OF RECOMMENDATIONS   ?DNAR/DNI ? ?Added dilaudid 0.36m IV Q2H PRN & Q8H ? ?Lidocaine (+) 3 patches to chest, shoulder, back - Will alternate with Voltaren Gel when patches are not in place ? ?Liberalized Ativan to 0.572mPO Q4H PRN ? ?Plan for [facility] to take over care tomorrow being that this is a hospice related admission for pain management ? ?  Code Status/Advance Care Planning: ?DNAR/DNI ? ?Palliative Prophylaxis:  ?Aspiration, Bowel Regimen, Delirium Protocol, Frequent Pain Assessment, Oral Care, Palliative Wound Care, and Turn Reposition ? ?Additional Recommendations (Limitations, Scope, Preferences): ?Comfort  focus ? ?Psycho-social/Spiritual:  ?Desire for further Chaplaincy support: No, not presently ?Additional Recommendations: Education on disease progression ?  ?Prognosis: Limited to weeks ? ?Discharge Planning: Discharge back home with [facility] hospice once medically optimized. ? ?Vitals:  ? 05/17/21 1500 05/17/21 1530  ?BP:  104/60  ?Pulse: 79 79  ?Resp: 18 12  ?Temp:    ?SpO2: 98% 100%  ? ?No intake or output data in the 24 hours ending 05/17/21 1623 ? ?Gen: 4 Caucasian female in moderate distress ?HEENT: moist mucous membranes ?CV: Regular rate and rhythm ?PULM: On room air breathing is even and nonlabored ?ABD: soft/nontender ?EXT: No edema ?Neuro: Alert and oriented x3 -speaks sign language ? ?PPS: 20-30% ? ? ?This conversation/these recommendations were discussed with patient primary care team, Dr. Roosevelt Locks ? ?Billing based on MDM: High ? ?Problems Addressed: One acute or chronic illness or injury that poses a threat to life or bodily function ? ?Amount and/or Complexity of Data: Category 3:Discussion of management or test interpretation with external physician/other qualified health care professional/appropriate source (not separately reported) ? ?Risks: Decision not to resuscitate or to de-escalate care because of poor prognosis ?______________________________________________________ ?Tacey Ruiz ?[facility] Team ?Team Cell Phone: [phone] ?Please utilize secure chat with additional questions, if there is no response within 30 minutes please call the above phone number ? ?Palliative Medicine Team providers are available by phone from 7am to 7pm daily and can be reached through the team cell phone.  ?Should this patient require assistance outside of these hours, please call the patient's attending physician. ? ? ? ?

## 2021-05-17 NOTE — H&P (Signed)
?History and Physical  ? ? ?[name] OFB:[id number] DOB: Aug 08, 1965 DOA: 05/17/2021 ? ?PCP: [name], [name] (Confirm with patient/family/NH records and if not entered, this has to be entered at [facility] point of entry) ?Patient coming from: Home ? ?I have personally briefly reviewed patient's old medical records in [place] ? ?Chief Complaint: Feeling miserable, everywhere hurts, chest pain ? ?HPI: [name] is a 56 y.o. 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 ? ?Patient is chronically deaf, as husband at bedside interpreting sign language.  Patient has stage IV metastatic breast cancer, recently came off chemotherapy due to worsening of pancytopenia.  She was referred to see hospice on 4/28, but formal hospice consult was yet to be done.  Patient has tried several pain medications including morphine and Dilaudid and fentanyl patch.  It appears that she has severe itchiness with morphine and fentanyl patch, she also has significant nauseous and vomiting with pain medications.  Last night, patient woke up with worsening of whole body aching as well as new onset of chest pain, pressure like, with SOB.  She feels nauseous all the time.  Denies any palpitations or sweating. ? ? ?ED Course: Blood pressure borderline low, work-up showed chronic pancytopenia, normal BMP. ? ?ED tried several pain medications, but patient claimed that not helping with either Chest pain or whole body aching ? ?Review of Systems: As per HPI otherwise 14 point review of systems negative.  ? ? ?Past Medical History:  ?Diagnosis Date  ? Abnormal Pap smear   ? Anxiety   ? Breast cancer ([place])   ? Cancer  [facility]) 1999  ? breast-s/p mastectomy, chemo, rad  ? CIN I (cervical intraepithelial neoplasia I) 2003  ? by colpo  ? Congenital deafness   ? Deaf   ? Depression   ? History of radiation therapy   ? Right femur, Left hip- 12/09/20-12/25/20- Dr. Gery Pray  ? History of radiation therapy   ? right pelvis/right femur 09/28/2017-10/11/2017  Dr Gery Pray  ? Port-A-Cath in place 2017  ? for chemotherapy  ? Radiation 07/12/15-07/26/15  ? left femur 30 Gy  ? S/P bilateral oophorectomy   ? ? ?Past Surgical History:  ?Procedure Laterality Date  ? ABDOMINAL HYSTERECTOMY    ? INTRAMEDULLARY (IM) NAIL INTERTROCHANTERIC Left 06/26/2015  ? Procedure: LEFT BIOMET LONG AFFIXS NAIL;  Surgeon: [name], MD;  Location: [facility];  Service: Orthopedics;  Laterality: Left;  ? IR GENERIC HISTORICAL  08/06/2015  ? IR US GUIDE VASC ACCESS LEFT 08/06/2015 Aletta Edouard, MD [facility]  ? IR GENERIC HISTORICAL  08/06/2015  ? IR FLUORO GUIDE CV LINE LEFT 08/06/2015 Aletta Edouard, MD [facility]  ? IR GENERIC HISTORICAL  03/05/2016  ? IR CV LINE INJECTION 03/05/2016 Markus Daft, MD [facility]  ? LEFT HEART CATH AND CORONARY ANGIOGRAPHY N/A 12/13/2017  ? Procedure: LEFT HEART CATH AND CORONARY ANGIOGRAPHY;  Surgeon: Leonie Man, MD;  Location: [facility] CV [facility];  Service: Cardiovascular;  Laterality: N/A;  ? MASTECTOMY    ? right breast  ? MASTECTOMY    ? OVARIAN CYST REMOVAL    ? RADIOLOGY WITH ANESTHESIA Left 06/25/2015  ? Procedure: MRI OF LEFT HIP WITH OR WITHOUT CONTRAST;  Surgeon: Medication Radiologist,  MD;  Location: [facility];  Service: Radiology;  Laterality: Left;  DR. MCINTYRE/MRI  ? TUBAL LIGATION    ? ? ? reports that she has never smoked. She has never used smokeless tobacco. She reports that she does not drink alcohol and does not use drugs. ? ?Allergies  ?Allergen Reactions  ? Promethazine Hcl Shortness Of Breath, Itching and Other (See Comments)  ?  Skin redness, also ? ?NOTE: Pt tolerates PO Compazine  ? Chlorhexidine Itching   ? Diphenhydramine Hcl Itching and Rash  ? ? ?Family History  ?Problem Relation Age of Onset  ? Hypertension Mother   ? Seizures Mother   ? Alzheimer's disease Maternal Uncle   ? Pancreatitis Maternal Grandmother   ? Heart attack Maternal Grandfather   ? ? ? ?Prior to Admission medications   ?Medication Sig Start Date End Date Taking? Authorizing Provider  ?betamethasone valerate ointment (VALISONE) 0.1 % Apply 1 application. topically 2 (two) times daily. 05/10/21   Pickenpack-Cousar, Carlena Sax, NP  ?dexamethasone (DECADRON) 4 MG tablet Take 1 tablet (4 mg total) by mouth daily. For 3-5 days after chemo ?Patient not taking: Reported on 11/20/2020 08/23/20   Truitt Merle, MD  ?fentaNYL (DURAGESIC) 25 MCG/HR Place 1 patch onto the skin every 3 (three) days. 05/09/21   Pickenpack-Cousar, Carlena Sax, NP  ?hydrocortisone 1 % ointment Apply 1 application. topically 2 (two) times daily. 05/09/21   Pickenpack-Cousar, Carlena Sax, NP  ?hydrOXYzine (ATARAX) 25 MG tablet Take 1 tablet (25 mg total) by mouth every 6 (six) hours as needed for itching. 05/09/21   Pickenpack-Cousar, Carlena Sax, NP  ?lidocaine (LIDODERM) 5 % Place 1 patch onto the skin daily. Remove & Discard patch within 12 hours or as directed by MD 02/04/21   Pickenpack-Cousar, Carlena Sax, NP  ?lidocaine-prilocaine (EMLA) cream Apply 1 application topically as needed. Apply to Porta-Cath 1-2 hours prior to access as directed. 03/07/21   Truitt Merle, MD  ?LORazepam (ATIVAN) 0.5 MG tablet Take 1 tablet (0.5 mg total) by mouth every 8 (eight) hours as needed for anxiety (nausea). 04/18/21   Pickenpack-Cousar, Carlena Sax, NP  ?megestrol (MEGACE) 40 MG tablet Take 1 tablet (40 mg total) by mouth daily. 03/07/21   Pickenpack-Cousar, Carlena Sax, NP  ?oxyCODONE (OXY IR/ROXICODONE) 5 MG immediate release tablet Take 1-2 tablets (5-10 mg total) by mouth every 4 (four) hours as needed for severe pain or breakthrough pain. 05/09/21   Pickenpack-Cousar, Carlena Sax, NP  ?potassium chloride (KLOR-CON) 10 MEQ  tablet Take 1 tablet (10 mEq total) by mouth 3 (three) times daily. 05/17/20   Truitt Merle, MD  ?prochlorperazine (COMPAZINE) 10 MG tablet TAKE 1 TABLET(10 MG) BY MOUTH EVERY 6 HOURS AS NEEDED FOR NAUSEA OR VOMITING 09/23/20   Truitt Merle, MD  ?zolpidem (AMBIEN) 10 MG tablet Take 1 tablet (10 mg total) by mouth at bedtime as needed for sleep. 03/07/21   Truitt Merle, MD  ? ? ?Physical Exam: ?Vitals:  ? 05/17/21 1245 05/17/21 1430 05/17/21 1445 05/17/21 1500  ?BP:  (!) 99/56 (!) 129/102   ?Pulse: 70 72 73 79  ?Resp: '11 16 12 18  '$ ?Temp:      ?TempSrc:      ?SpO2: 100% 100% 100% 98%  ? ? ?Constitutional: NAD, calm, comfortable ?Vitals:  ? 05/17/21 1245 05/17/21 1430 05/17/21 1445 05/17/21 1500  ?BP:  (!) 99/56 (!) 129/102   ?Pulse: 70 72 73 79  ?Resp: '11 16 12 18  '$ ?Temp:      ?TempSrc:      ?  SpO2: 100% 100% 100% 98%  ? ?Eyes: PERRL, lids and conjunctivae normal ?ENMT: Mucous membranes are dry. Posterior pharynx clear of any exudate or lesions.Normal dentition.  ?Neck: normal, supple, no masses, no thyromegaly ?Respiratory: clear to auscultation bilaterally, no wheezing, no crackles. Normal respiratory effort. No accessory muscle use.  ?Cardiovascular: Regular rate and rhythm, no murmurs / rubs / gallops. No extremity edema. 2+ pedal pulses. No carotid bruits.  ?Abdomen: no tenderness, no masses palpated. No hepatosplenomegaly. Bowel sounds positive.  ?Musculoskeletal: no clubbing / cyanosis. No joint deformity upper and lower extremities. Good ROM, no contractures. Normal muscle tone.  ?Skin: no rashes, lesions, ulcers. No induration ?Neurologic: CN 2-12 grossly intact. Sensation intact, DTR normal. Strength 5/5 in all 4.  ?Psychiatric: Normal judgment and insight. Alert and oriented x 3. Normal mood.  ? ? ?Labs on Admission: I have personally reviewed following labs and imaging studies ? ?CBC: ?Recent Labs  ?Lab 05/17/21 ?1230  ?WBC 2.5*  ?HGB 7.9*  ?HCT 22.6*  ?MCV 87.3  ?PLT 57*  ? ?Basic Metabolic Panel: ?Recent Labs  ?Lab  05/17/21 ?1152  ?NA 137  ?K 3.4*  ?CL 104  ?CO2 24  ?GLUCOSE 104*  ?BUN 7  ?CREATININE 0.93  ?CALCIUM 10.0  ? ?GFR: ?Estimated Creatinine Clearance: 52.7 mL/min (by C-G formula based on SCr of 0.93 mg/dL

## 2021-05-17 NOTE — ED Triage Notes (Signed)
Pt to ED via EMS from home with c/o chest tightness since yesterday. Pt is on home hospice for breast cancer. Hospice called EMS for patient to be brought to ED for CP. Patient alert on arrival to ED, deaf (ASL), right arm restricted, port left chest. ? ?EMS v/s: ?108/60 ?100% room air ?80 HR ?16 RR ?NSR ? ?Pt took '30mg'$  morphine, 0.5 ativan at home prior to transport. ?

## 2021-05-17 NOTE — ED Notes (Signed)
Pt is extremely grumpy w this RN requesting another Therapist, sports. Charge nurse made aware.  ?

## 2021-05-17 NOTE — ED Provider Notes (Signed)
?[facility] ?Provider Note ? ? ?CSN: [id number] ?Arrival date & time: 05/17/21  1048 ? ?  ? ?History ? ?Chief Complaint  ?Patient presents with  ? Chest Pain  ? ? ?[name] is a 56 y.o. female. ? ?Pt recently had pain medications changed.  Pt reports she had severe itching with morphine and dilaudid.  Pt has been started on a fentanyl patch and oxycodone.  Pt complains of continued itching.  Pt reports this pain is worse than previous pain.    ? ?The history is provided by the patient. No language interpreter was used.  ?Chest Pain ?Pain location:  R chest ?Pain quality: aching and throbbing   ?Pain radiates to:  Does not radiate ?Pain severity:  Moderate ?Onset quality:  Gradual ?Duration:  3 days ?Timing:  Constant ?Progression:  Worsening ?Chronicity:  New ?Context: breathing   ?Worsened by:  Nothing ? ?  ? ?Home Medications ?Prior to Admission medications   ?Medication Sig Start Date End Date Taking? Authorizing Provider  ?betamethasone valerate ointment (VALISONE) 0.1 % Apply 1 application. topically 2 (two) times daily. 05/10/21   Pickenpack-Cousar, Carlena Sax, NP  ?dexamethasone (DECADRON) 4 MG tablet Take 1 tablet (4 mg total) by mouth daily. For 3-5 days after chemo ?Patient not taking: Reported on 11/20/2020 08/23/20   Truitt Merle, MD  ?fentaNYL (DURAGESIC) 25 MCG/HR Place 1 patch onto the skin every 3 (three) days. 05/09/21   Pickenpack-Cousar, Carlena Sax, NP  ?hydrocortisone 1 % ointment Apply 1 application. topically 2 (two) times daily. 05/09/21   Pickenpack-Cousar, Carlena Sax, NP  ?hydrOXYzine (ATARAX) 25 MG tablet Take 1 tablet (25 mg total) by mouth every 6 (six) hours as needed for itching. 05/09/21   Pickenpack-Cousar, Carlena Sax, NP  ?lidocaine (LIDODERM) 5 % Place 1 patch onto the skin daily. Remove & Discard patch within 12 hours or as directed by MD 02/04/21   Pickenpack-Cousar, Carlena Sax, NP  ?lidocaine-prilocaine (EMLA) cream Apply 1 application topically as  needed. Apply to Porta-Cath 1-2 hours prior to access as directed. 03/07/21   Truitt Merle, MD  ?LORazepam (ATIVAN) 0.5 MG tablet Take 1 tablet (0.5 mg total) by mouth every 8 (eight) hours as needed for anxiety (nausea). 04/18/21   Pickenpack-Cousar, Carlena Sax, NP  ?megestrol (MEGACE) 40 MG tablet Take 1 tablet (40 mg total) by mouth daily. 03/07/21   Pickenpack-Cousar, Carlena Sax, NP  ?oxyCODONE (OXY IR/ROXICODONE) 5 MG immediate release tablet Take 1-2 tablets (5-10 mg total) by mouth every 4 (four) hours as needed for severe pain or breakthrough pain. 05/09/21   Pickenpack-Cousar, Carlena Sax, NP  ?potassium chloride (KLOR-CON) 10 MEQ tablet Take 1 tablet (10 mEq total) by mouth 3 (three) times daily. 05/17/20   Truitt Merle, MD  ?prochlorperazine (COMPAZINE) 10 MG tablet TAKE 1 TABLET(10 MG) BY MOUTH EVERY 6 HOURS AS NEEDED FOR NAUSEA OR VOMITING 09/23/20   Truitt Merle, MD  ?zolpidem (AMBIEN) 10 MG tablet Take 1 tablet (10 mg total) by mouth at bedtime as needed for sleep. 03/07/21   Truitt Merle, MD  ?   ? ?Allergies    ?Promethazine hcl, Chlorhexidine, and Diphenhydramine hcl   ? ?Review of Systems   ?Review of Systems  ?Cardiovascular:  Positive for chest pain.  ?All other systems reviewed and are negative. ? ?Physical Exam ?Updated Vital Signs ?BP 103/64   Pulse 70   Temp 99.1 ?F (37.3 ?C) (Oral)   Resp 11   SpO2 100%  ?Physical Exam ?  Vitals and nursing note reviewed.  ?Constitutional:   ?   Appearance: She is well-developed.  ?HENT:  ?   Head: Normocephalic.  ?Cardiovascular:  ?   Heart sounds: Normal heart sounds.  ?Pulmonary:  ?   Effort: Pulmonary effort is normal.  ?Chest:  ?   Chest wall: Tenderness present.  ?Abdominal:  ?   General: There is no distension.  ?   Palpations: Abdomen is soft.  ?Musculoskeletal:     ?   General: Normal range of motion.  ?   Cervical back: Normal range of motion.  ?Neurological:  ?   Mental Status: She is alert and oriented to person, place, and time.  ? ? ?ED Results / Procedures /  Treatments   ?Labs ?(all labs ordered are listed, but only abnormal results are displayed) ?Labs Reviewed  ?BASIC METABOLIC PANEL - Abnormal; Notable for the following components:  ?    Result Value  ? Potassium 3.4 (*)   ? Glucose, Bld 104 (*)   ? All other components within normal limits  ?CBC - Abnormal; Notable for the following components:  ? WBC 2.5 (*)   ? RBC 2.59 (*)   ? Hemoglobin 7.9 (*)   ? HCT 22.6 (*)   ? RDW 16.9 (*)   ? Platelets 57 (*)   ? All other components within normal limits  ?HEPATIC FUNCTION PANEL  ?TROPONIN I (HIGH SENSITIVITY)  ?TROPONIN I (HIGH SENSITIVITY)  ? ? ?EKG ?None ? ?Radiology ?DG Chest Portable 1 View ? ?Result Date: 05/17/2021 ?CLINICAL DATA:  Chest pain beginning yesterday.  Breast carcinoma. EXAM: PORTABLE CHEST 1 VIEW COMPARISON:  11/12/2019 FINDINGS: The heart size and mediastinal contours are within normal limits. Power port remains in appropriate position. Both lungs are clear. Surgical clips again seen in the right breast and axilla. IMPRESSION: No active disease. Electronically Signed   By: Marlaine Hind M.D.   On: 05/17/2021 12:24   ? ?Procedures ?Procedures  ? ? ?Medications Ordered in ED ?Medications  ?fentaNYL (SUBLIMAZE) injection 50 mcg (has no administration in time range)  ? ? ?ED Course/ Medical Decision Making/ A&P ?  ?                        ?Medical Decision Making ?Pt complains of pain in her right chest.  ? ?Amount and/or Complexity of Data Reviewed ?External Data Reviewed: notes. ?   Details: oncology notes reviewed.  Pt is transitioning to hospice care. ?Labs: ordered. Decision-making details documented in ED Course. ?   Details: Hemoglobin is 7.9. Potasium is 3.4. troponin is negativ ?Radiology: ordered and independent interpretation performed. Decision-making details documented in ED Course. ?   Details: chest xray  no acute abnormality ?ECG/medicine tests: ordered and independent interpretation performed. Decision-making details documented in ED  Course. ?   Details: Normal sinus  normal ekg ?Discussion of management or test interpretation with external provider(s): I spoke with Dr. Roosevelt Locks who will admit for pain control  ? ?Risk ?Prescription drug management. ?Decision regarding hospitalization. ? ? ? ? ? ? ? ? ? ? ?Final Clinical Impression(s) / ED Diagnoses ?Final diagnoses:  ?Malignant neoplasm of right female breast, unspecified estrogen receptor status, unspecified site of breast ([place])  ?Cancer-related breakthrough pain  ? ? ?Rx / DC Orders ?ED Discharge Orders   ? ? None  ? ?  ? ? ?  ?Fransico Meadow, [place] ?05/17/21 1514 ? ?  ?Wyvonnia Dusky, MD ?05/17/21 1600 ? ?

## 2021-05-18 DIAGNOSIS — E46 Unspecified protein-calorie malnutrition: Secondary | ICD-10-CM | POA: Diagnosis not present

## 2021-05-18 DIAGNOSIS — Z7189 Other specified counseling: Secondary | ICD-10-CM

## 2021-05-18 DIAGNOSIS — Z9221 Personal history of antineoplastic chemotherapy: Secondary | ICD-10-CM | POA: Diagnosis not present

## 2021-05-18 DIAGNOSIS — T451X5D Adverse effect of antineoplastic and immunosuppressive drugs, subsequent encounter: Secondary | ICD-10-CM | POA: Diagnosis not present

## 2021-05-18 DIAGNOSIS — T451X5A Adverse effect of antineoplastic and immunosuppressive drugs, initial encounter: Secondary | ICD-10-CM | POA: Diagnosis not present

## 2021-05-18 DIAGNOSIS — E860 Dehydration: Secondary | ICD-10-CM | POA: Diagnosis present

## 2021-05-18 DIAGNOSIS — F419 Anxiety disorder, unspecified: Secondary | ICD-10-CM | POA: Diagnosis present

## 2021-05-18 DIAGNOSIS — H905 Unspecified sensorineural hearing loss: Secondary | ICD-10-CM | POA: Diagnosis present

## 2021-05-18 DIAGNOSIS — Z681 Body mass index (BMI) 19 or less, adult: Secondary | ICD-10-CM | POA: Diagnosis not present

## 2021-05-18 DIAGNOSIS — Z515 Encounter for palliative care: Secondary | ICD-10-CM | POA: Diagnosis not present

## 2021-05-18 DIAGNOSIS — G893 Neoplasm related pain (acute) (chronic): Secondary | ICD-10-CM | POA: Diagnosis not present

## 2021-05-18 DIAGNOSIS — Z66 Do not resuscitate: Secondary | ICD-10-CM

## 2021-05-18 DIAGNOSIS — L299 Pruritus, unspecified: Secondary | ICD-10-CM | POA: Diagnosis present

## 2021-05-18 DIAGNOSIS — R52 Pain, unspecified: Secondary | ICD-10-CM | POA: Diagnosis not present

## 2021-05-18 DIAGNOSIS — K219 Gastro-esophageal reflux disease without esophagitis: Secondary | ICD-10-CM | POA: Diagnosis not present

## 2021-05-18 DIAGNOSIS — I959 Hypotension, unspecified: Secondary | ICD-10-CM | POA: Diagnosis present

## 2021-05-18 DIAGNOSIS — F32A Depression, unspecified: Secondary | ICD-10-CM | POA: Diagnosis not present

## 2021-05-18 DIAGNOSIS — Z9011 Acquired absence of right breast and nipple: Secondary | ICD-10-CM | POA: Diagnosis not present

## 2021-05-18 DIAGNOSIS — C7951 Secondary malignant neoplasm of bone: Secondary | ICD-10-CM | POA: Diagnosis not present

## 2021-05-18 DIAGNOSIS — D61810 Antineoplastic chemotherapy induced pancytopenia: Secondary | ICD-10-CM | POA: Diagnosis present

## 2021-05-18 DIAGNOSIS — E43 Unspecified severe protein-calorie malnutrition: Secondary | ICD-10-CM | POA: Diagnosis present

## 2021-05-18 DIAGNOSIS — C50911 Malignant neoplasm of unspecified site of right female breast: Secondary | ICD-10-CM | POA: Diagnosis not present

## 2021-05-18 DIAGNOSIS — I2510 Atherosclerotic heart disease of native coronary artery without angina pectoris: Secondary | ICD-10-CM | POA: Diagnosis present

## 2021-05-18 DIAGNOSIS — Z923 Personal history of irradiation: Secondary | ICD-10-CM | POA: Diagnosis not present

## 2021-05-18 DIAGNOSIS — D6481 Anemia due to antineoplastic chemotherapy: Secondary | ICD-10-CM | POA: Diagnosis not present

## 2021-05-18 DIAGNOSIS — R42 Dizziness and giddiness: Secondary | ICD-10-CM | POA: Diagnosis not present

## 2021-05-18 DIAGNOSIS — M8589 Other specified disorders of bone density and structure, multiple sites: Secondary | ICD-10-CM | POA: Diagnosis not present

## 2021-05-18 DIAGNOSIS — G620 Drug-induced polyneuropathy: Secondary | ICD-10-CM | POA: Diagnosis not present

## 2021-05-18 DIAGNOSIS — H913 Deaf nonspeaking, not elsewhere classified: Secondary | ICD-10-CM | POA: Diagnosis not present

## 2021-05-18 DIAGNOSIS — K5903 Drug induced constipation: Secondary | ICD-10-CM | POA: Diagnosis not present

## 2021-05-18 LAB — HIV ANTIBODY (ROUTINE TESTING W REFLEX): HIV Screen 4th Generation wRfx: NONREACTIVE

## 2021-05-18 MED ORDER — MORPHINE SULFATE (PF) 4 MG/ML IV SOLN
4.0000 mg | Freq: Four times a day (QID) | INTRAVENOUS | Status: DC | PRN
  Administered 2021-05-18 – 2021-05-20 (×3): 4 mg via INTRAVENOUS
  Filled 2021-05-18 (×4): qty 1

## 2021-05-18 MED ORDER — BISACODYL 10 MG RE SUPP: 10.0000 mg | Freq: Every day | RECTAL | Status: DC | PRN

## 2021-05-18 MED ORDER — MORPHINE SULFATE ER 15 MG PO TBCR
15.0000 mg | EXTENDED_RELEASE_TABLET | Freq: Two times a day (BID) | ORAL | Status: DC
  Administered 2021-05-18: 15 mg via ORAL
  Filled 2021-05-18: qty 1

## 2021-05-18 MED ORDER — SENNOSIDES-DOCUSATE SODIUM 8.6-50 MG PO TABS
2.0000 | ORAL_TABLET | Freq: Every day | ORAL | Status: DC
  Administered 2021-05-18: 2 via ORAL
  Filled 2021-05-18: qty 2

## 2021-05-18 NOTE — Progress Notes (Signed)
? ?Palliative Medicine Inpatient Follow Up Note ? ? ?HPI: ?[name] is a 56 y.o. 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 ?  ?Patient is active with [facility]. A liaison will see her today. ?  ?In the meanwhile the inpatient Palliative care team has been requested to support pain management. ? ?Today's Discussion 05/18/2021 ? ?*Please note that this is a verbal dictation therefore any spelling or grammatical errors are due to the "[employer]" system interpretation. ? ?Chart reviewed inclusive of vital signs, progress notes, laboratory results, and diagnostic images.  ? ?It appears the orders I did place yesterday for Dilaudid have gotten discontinued and replaced by morphine.  Given that this is the case I went ahead and restarted patient's MS Contin which she was receiving twice daily prior to hospitalization.  I also agree with continuing as needed morphine IV pushes. ? ?I was able to speak to patient's bedside RN this morning, [name].  Per report patient has been still experiencing some chest pain and moving slowly when she is out of bed. ? ?Upon bedside assessment this morning [name] was sleeping comfortably exemplify no nonverbal signs of distress.  There was no family present at bedside. ? ?I have coordinated with the [facility] liaison as hospice will continue [facility] symptom management today and thereafter. ? ?Objective Assessment: ?Vital Signs ?Vitals:  ? 05/18/21 0519 05/18/21 0810  ?BP: (!) 95/56 (!) 89/50  ?Pulse: 75 73  ?Resp: 16 18  ?Temp: 97.6 ?F (36.4 ?C) 98 ?F (36.7 ?C)  ?SpO2: 100% 99%  ? ? ?Intake/Output Summary (Last 24 hours) at 05/18/2021 1003 ?Last data  filed at 05/18/2021 0200 ?Gross per 24 hour  ?Intake 391.48 ml  ?Output --  ?Net 391.48 ml  ? ?Last Weight  Most recent update: 05/17/2021  6:14 PM  ? ? Weight  ?51 kg (112 lb 7 oz)  ?      ? ?  ? ?Gen: 89 Caucasian female in moderate distress ?HEENT: moist mucous membranes ?CV: Regular rate and rhythm ?PULM: On room air breathing is even and nonlabored ?ABD: soft/nontender ?EXT: No edema ?Neuro: Alert and oriented x3 -speaks sign language ? ?SUMMARY OF RECOMMENDATIONS   ?DNAR/DNI ?  ?Continue morphine 4 mg IV push every 6 hours as needed, added back MS Contin 15 mg PO twice daily, continue Dilaudid 4 mg  PO every 4 hours as needed as she was taking it at home  ? ?lidocaine (+) 3 patches to chest, shoulder, back - Will alternate with Voltaren Gel when patches are not in place ?  ?Liberalized Ativan to 0.'5mg'$  PO Q4H PRN ? ?I have sent a secure chat to hospice liaison alerting her of the above changes ?  ?Plan for [facility] to take over care tomorrow being that this is a hospice related admission for pain management ? ?Billing based on MDM: High --> in the setting of controlled substance management for symptomatic relief. ?_____________________________________________________________________________________ ?Tacey Ruiz ?[facility] Team ?Team Cell Phone: [phone] ?Please utilize secure chat with additional questions, if there is no response within 30 minutes please call the above phone number ? ?Palliative Medicine Team providers are available by phone from 7am to 7pm daily and can be reached through the team cell phone.  ?Should this  patient require assistance outside of these hours, please call the patient's attending physician. ? ? ? ? ?

## 2021-05-18 NOTE — Progress Notes (Signed)
? ?[facility] ?PROGRESS NOTE ? ? ?[name] UVO:[id number] DOB: 1965/11/19 DOA: 05/17/2021 ? ?PCP: [name], [name] ? ?Brief History/Interval Summary: 56 y.o. 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  ?  ?Patient is chronically deaf, as husband at bedside interpreting sign language.  Patient has stage IV metastatic breast cancer, recently came off chemotherapy due to worsening of pancytopenia.  She was referred to see hospice on 4/28, but formal hospice consult is yet to be done.  Patient has tried several pain medications including morphine and Dilaudid and fentanyl patch.  It appears that she has severe itchiness with morphine and fentanyl patch, she also has significant nauseous and vomiting with pain medications.  She was hospitalized for further management for cancer associated pain ? ? ?Consultants: Palliative care ? ?Procedures: None ? ? ? ?Subjective/Interval History: ?Patient is deaf.  Communication was via handwritten messages.  Complains of pain and itching this morning. ? ? ? ?Assessment/Plan: ? ?Intractable cancer related pain ?Patient has not done well with her outpatient regimen.  She was hospitalized for further management.  Appreciate palliative care assistance with this.  Currently noted to be on long-acting feeding, lidocaine patch along with as needed hydromorphone tablets and as needed morphine injections. ?Complains of itching this morning.  She is noted to be on hydroxyzine for same. ? ?Chest pain ?Likely due to her cancer.  She does have a history of nonobstructive CAD on cath in 2019.  No indication for cardiac work-up. ? ?Metastatic breast  cancer ?Cancer specific treatments were discontinued recently.  Plan is for hospice.  Life expectancy is a few months. ? ?Dehydration ?Started on IV fluids.  Cut back on the rate today. ? ?Pancytopenia ?Secondary to chemotherapy as well as from cancer. ? ?DVT Prophylaxis: SCDs ?Code Status: DNR ?Family Communication: Discussed with patient.  No family at bedside ?Disposition Plan: Hopefully return home once hospice services have been established and once her pain is adequately controlled ? ?Status is: Observation ?The patient will require care spanning > 2 midnights and should be moved to inpatient because: Pain remains inadequately controlled, requiring IV pain medications ? ? ? ?Medications: Scheduled: ? diclofenac Sodium  4 g Topical BID  ? feeding supplement  237 mL Oral BID BM  ? fentaNYL (SUBLIMAZE) injection  50 mcg Intravenous Once  ? hydrocortisone  1 application. Topical BID  ? lidocaine  3 patch Transdermal Q24H  ? megestrol  40 mg Oral Daily  ? morphine  15 mg Oral Q12H  ? potassium chloride  10 mEq Oral TID  ? sodium chloride flush  10-40 mL Intracatheter Q12H  ? triamcinolone cream   Topical BID  ? ?Continuous: ? sodium chloride 125 mL/hr at 05/17/21 2251  ? ?VQQ:[id number], hydrOXYzine, LORazepam, morphine injection, pentafluoroprop-tetrafluoroeth, prochlorperazine, zolpidem ? ?Antibiotics: ?Anti-infectives (From admission, onward)  ? ? None  ? ?  ? ? ?Objective: ? ?Vital Signs ? ?Vitals:  ? 05/17/21 1752 05/17/21 1812 05/18/21 0519 05/18/21 0810  ?BP:  102/73 (!) 95/56 (!) 89/50  ?Pulse:  80 75 73  ?Resp:  '18 16 18  '$ ?Temp: 99 ?F (37.2 ?C) 97.7 ?F (36.5 ?C) 97.6 ?F (36.4 ?C) 98 ?F (36.7 ?C)  ?TempSrc: Oral Oral Oral Oral  ?SpO2:  100% 100% 99%  ?Weight:  51 kg    ?Height:  '5\' 7"'$  (1.702 m)    ? ? ?Intake/Output Summary (Last 24 hours) at 05/18/2021 1025 ?Last data filed at 05/18/2021 0200 ?Gross per 24 hour  ?Intake 391.48 ml  ?Output --  ?Net 391.48 ml  ? ?Filed Weights  ? 05/17/21 1812  ?Weight: 51 kg   ? ? ?General appearance: Awake alert.  In no distress ?Resp: Clear to auscultation bilaterally.  Normal effort ?Cardio: S1-S2 is normal regular.  No S3-S4.  No rubs murmurs or bruit ?GI: Abdomen is soft.  Nontender nondistended.  Bowel sounds are present normal.  No masses organomegaly ? ? ?Lab Results: ? ?Data Reviewed: I have personally reviewed following labs and reports of the imaging studies ? ?CBC: ?Recent Labs  ?Lab 05/17/21 ?1230  ?WBC 2.5*  ?HGB 7.9*  ?HCT 22.6*  ?MCV 87.3  ?PLT 57*  ? ? ?Basic Metabolic Panel: ?Recent Labs  ?Lab 05/17/21 ?1152  ?NA 137  ?K 3.4*  ?CL 104  ?CO2 24  ?GLUCOSE 104*  ?BUN 7  ?CREATININE 0.93  ?CALCIUM 10.0  ? ? ?GFR: ?Estimated Creatinine Clearance: 55 mL/min (by C-G formula based on SCr of 0.93 mg/dL). ? ?Liver Function Tests: ?Recent Labs  ?Lab 05/17/21 ?1230  ?AST 28  ?ALT 12  ?ALKPHOS 85  ?BILITOT 2.3*  ?PROT 5.5*  ?ALBUMIN 2.8*  ? ? ? ?Radiology Studies: ?DG Chest Portable 1 View ? ?Result Date: 05/17/2021 ?CLINICAL DATA:  Chest pain beginning yesterday.  Breast carcinoma. EXAM: PORTABLE CHEST 1 VIEW COMPARISON:  11/12/2019 FINDINGS: The heart size and mediastinal contours are within normal limits. Power port remains in appropriate position. Both lungs are clear. Surgical clips again seen in the right breast and axilla. IMPRESSION: No active disease. Electronically Signed   By: Marlaine Hind M.D.   On: 05/17/2021 12:24   ? ? ? ? LOS: 0 days  ? ?[name] ? ?[facility] ?Pager on [email or web address] ? ?05/18/2021, 10:25 AM ? ? ?

## 2021-05-18 NOTE — Progress Notes (Signed)
[facility] 1O10 [facility]) hospitalized hospice patient visit ? ?Ms. [name] is current [facility] patient with a terminal diagnosis of Malignant Breast Cancer. She has been having worsening issues with uncontrolled pain and on the afternoon of 5.6.23 decided to present to the emergency room for same. [facility] was notified of plan to go to ER. She was admitted to the hospital on 5.6.23 for uncontrolled pain. Per Dr. Cherie Ouch with [facility] this is a related hospital admission.  ? ?Patient is deaf and requires interpreter or family for conversation however can also communicate by writing. She has been experiencing ongoing issues with pain control and palliative has been consulted to assist with this. She however is having some worsening issues from side effects of what is being given.  ? ?Patient is inpatient appropriate due to need for IV pain medication, establishing home regimen ? ?Vital Signs-  98.3/60/18    95/47    spO2 98%  room air ?Intake/Output- 391/0 ?Abnormal labs- K+ 3.4, Chloride 104, Albumin 2.8, Total Protein 5.5, WBC 2.5, RBC 2.59, Hgb 7.9, Hct 22.6, Platelets 57   ?Diagnostics-   ?PORTABLE CHEST 1 VIEW ? COMPARISON:  11/12/2019 ? FINDINGS: ?The heart size and mediastinal contours are within normal limits. ?Power port remains in appropriate position. Both lungs are clear. ?Surgical clips again seen in the right breast and axilla. ? IMPRESSION: ?No active disease. ? Electronically Signed ?  By: Marlaine Hind M.D. ?  On: 05/17/2021 12:24 ? ?IV/PRN Meds-  MS Contin '15mg'$  q12H, NS IV 50cc/H, MSO4 '4mg'$ /ml IV x2, Atarax '25mg'$  x2 ?Problem List ?Intractable cancer related pain ?Patient has not done well with her outpatient regimen.  She was hospitalized for further anagement.  Appreciate palliative care assistance with this.  Currently noted to be on long-acting feeding, lidocaine patch along with as needed hydromorphone tablets and as needed morphine injections. ?Complains of itching this morning.   She is noted to be on hydroxyzine for same. ?Chest pain ?Likely due to her cancer.  She does have a history of nonobstructive CAD on cath in 2019.  No indication for cardiac work-up. ?Metastatic breast cancer ?Cancer specific treatments were discontinued recently.  Plan is for hospice.  Life expectancy is a few months. ?Dehydration ?Started on IV fluids.  Cut back on the rate today. ? Pancytopenia ?Secondary to chemotherapy as well as from cancer. ? ?Discharge Planning- Plan to discharge once pain regimen established ?Family Contact- Left voicemail for husband ?IDT- Updated ?Goals of care - Pain control  ?Should patient need ambulance transfer at discharge please use [facility]) as they contract this service for our active hospice patients.  ?Hospital transfer and medication list placed on patient's hard chart.  ?Jhonnie Garner, Therapist, sports, BSN, WTA-C ?[facility] Liaison ?[phone] ?

## 2021-05-19 DIAGNOSIS — C7951 Secondary malignant neoplasm of bone: Secondary | ICD-10-CM

## 2021-05-19 DIAGNOSIS — T451X5A Adverse effect of antineoplastic and immunosuppressive drugs, initial encounter: Secondary | ICD-10-CM

## 2021-05-19 DIAGNOSIS — C50911 Malignant neoplasm of unspecified site of right female breast: Secondary | ICD-10-CM

## 2021-05-19 DIAGNOSIS — D6481 Anemia due to antineoplastic chemotherapy: Secondary | ICD-10-CM

## 2021-05-19 DIAGNOSIS — R52 Pain, unspecified: Secondary | ICD-10-CM | POA: Diagnosis not present

## 2021-05-19 DIAGNOSIS — K5903 Drug induced constipation: Secondary | ICD-10-CM

## 2021-05-19 DIAGNOSIS — G893 Neoplasm related pain (acute) (chronic): Secondary | ICD-10-CM | POA: Diagnosis not present

## 2021-05-19 LAB — CBC
HCT: 20 % — ABNORMAL LOW (ref 36.0–46.0)
Hemoglobin: 6.8 g/dL — CL (ref 12.0–15.0)
MCH: 30.6 pg (ref 26.0–34.0)
MCHC: 34 g/dL (ref 30.0–36.0)
MCV: 90.1 fL (ref 80.0–100.0)
Platelets: 51 10*3/uL — ABNORMAL LOW (ref 150–400)
RBC: 2.22 MIL/uL — ABNORMAL LOW (ref 3.87–5.11)
RDW: 17 % — ABNORMAL HIGH (ref 11.5–15.5)
WBC: 2 10*3/uL — ABNORMAL LOW (ref 4.0–10.5)
nRBC: 1 % — ABNORMAL HIGH (ref 0.0–0.2)

## 2021-05-19 LAB — BASIC METABOLIC PANEL
Anion gap: 6 (ref 5–15)
BUN: 13 mg/dL (ref 6–20)
CO2: 24 mmol/L (ref 22–32)
Calcium: 10.2 mg/dL (ref 8.9–10.3)
Chloride: 110 mmol/L (ref 98–111)
Creatinine, Ser: 1 mg/dL (ref 0.44–1.00)
GFR, Estimated: >60 mL/min (ref >60)
Glucose, Bld: 103 mg/dL — ABNORMAL HIGH (ref 70–99)
Potassium: 4.3 mmol/L (ref 3.5–5.1)
Sodium: 140 mmol/L (ref 135–145)

## 2021-05-19 LAB — PREPARE RBC (CROSSMATCH)

## 2021-05-19 MED ORDER — LORATADINE 10 MG PO TABS
10.0000 mg | ORAL_TABLET | Freq: Every day | ORAL | Status: DC
  Administered 2021-05-19 – 2021-05-20 (×2): 10 mg via ORAL
  Filled 2021-05-19 (×2): qty 1

## 2021-05-19 MED ORDER — SENNOSIDES-DOCUSATE SODIUM 8.6-50 MG PO TABS
2.0000 | ORAL_TABLET | Freq: Two times a day (BID) | ORAL | Status: DC
  Filled 2021-05-19 (×2): qty 2

## 2021-05-19 MED ORDER — OXYCODONE HCL 5 MG PO TABS
5.0000 mg | ORAL_TABLET | Freq: Once | ORAL | Status: AC
  Administered 2021-05-19: 5 mg via ORAL
  Filled 2021-05-19: qty 1

## 2021-05-19 MED ORDER — SODIUM CHLORIDE 0.9% IV SOLUTION: Freq: Once | INTRAVENOUS | Status: AC

## 2021-05-19 MED ORDER — DEXAMETHASONE 4 MG PO TABS
4.0000 mg | ORAL_TABLET | Freq: Every day | ORAL | Status: DC
  Administered 2021-05-19 – 2021-05-20 (×2): 4 mg via ORAL
  Filled 2021-05-19 (×2): qty 1

## 2021-05-19 MED ORDER — KETOROLAC TROMETHAMINE 30 MG/ML IJ SOLN
30.0000 mg | Freq: Once | INTRAMUSCULAR | Status: AC
  Administered 2021-05-19: 30 mg via INTRAVENOUS
  Filled 2021-05-19: qty 1

## 2021-05-19 MED ORDER — SORBITOL 70 % SOLN
30.0000 mL | Freq: Once | Status: AC
  Administered 2021-05-19: 30 mL via ORAL
  Filled 2021-05-19: qty 30

## 2021-05-19 MED ORDER — OXYCODONE HCL 5 MG PO TABS: 5.0000 mg | ORAL_TABLET | ORAL | Status: DC | PRN

## 2021-05-19 NOTE — Progress Notes (Signed)
Patient [name]      DOB: 07-13-1965      JZP:[id number] ? ? ? ?  ?Palliative Medicine Team ? ? ? ?Subjective: Bedside symptom check completed. Patient's husband, outpatient hospice RN, bedside RN, and ASL translator Juliann Pulse bedside at time of visit.  ? ? ?Physical exam: Patient sitting up in bed eating breakfast at time of visit. Breathing even and non-labored, no excessive secretions noted. Patient without physical or non-verbal signs of pain or discomfort at this time. Patient conversational with assistance of interpretor. Patient is receiving one unit of blood as this RN enters room.  ? ? ?Assessment and plan: Complex care planning under way for this patient, as she is planning to discharge and move to [place], [place] with her daughter. [facility] RN is assisting the patient and family with finding a different hospice service to resume care after moving. This RN reached out to NP to assist with pain/symptom management as many analgesic options are causing "itching." NP to follow up with patient, this RN provided translator service phone number for next meeting. Patient without any pain or other concerns this morning. Bedside RN, Judson Roch with out other concerns or needs at this time. Will continue to follow for any changes or advances.  ? ? ?Thank you for allowing the Palliative Medicine Team to assist in the care of this patient. ?  ?  ?Damian Leavell, MSN, RN ?Palliative Medicine Team ?Team Phone: [phone]  ?This phone is monitored 7a-7p, please reach out to attending physician outside of these hours for urgent needs.   ?

## 2021-05-19 NOTE — Progress Notes (Signed)
?Progress Note ? ?Patient: [name] KCL:[id number] DOB: 10-12-1965  ?DOA: 05/17/2021  DOS: 05/19/2021  ?  ?Brief hospital course: ? 56 y.o. 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  ?  ?Patient is chronically deaf, as husband at bedside interpreting sign language.  Patient has stage IV metastatic breast cancer, recently came off chemotherapy due to worsening of pancytopenia.  She was referred to see hospice on 4/28, but formal hospice consult is yet to be done.  Patient has tried several pain medications including morphine and Dilaudid and fentanyl patch.  It appears that she has severe itchiness with morphine and fentanyl patch, she also has significant nauseous and vomiting with pain medications.  She was hospitalized for further management for cancer associated pain.  ? ?Pruritus has been a limiting factor in pain treatment. Oxycodone is being started and will be monitored with continuation of IV prns. Plan will be discharge to eldest daughter's home outside [place], [place]. Hospice referral is pending.  ? ?Assessment and Plan: ?Intractable cancer related pain: Side effects, mainly pruritus, limits opioid use.  ?- Restart oxycodone and plan to titrate up dose if tolerating. Pt has had worse itching with morphine. Also itching with fentanyl, though this should theoretically be the least offensive med.  ?- Hopefully, tolerates oxycodone and can convert to oxycontin + prn oxycodone.  ?- Continue prn morphine IV  ?- Manage pruritus with hydroxyzine prn, add loratadine daily. ?  ?Chest pain: Likely due to her cancer.  She does have a history of nonobstructive CAD on cath in 2019.  ?- No indication for  cardiac work-up at this time. ? ?Metastatic breast cancer: Cancer specific treatments were discontinued recently.  Plan is for hospice care at home.  Life expectancy is a few months. ?  ?Dehydration: Started on IV fluids, cut back as she's tolerating po. ?  ?Pancytopenia: Secondary to chemotherapy as well as from cancer. ?- With concomitant hypotension, suspected symptomatic anemia, 1u PRBC administered 5/8. ? ?Subjective: Daughter acts as Investment banker, corporate by facetime this afternoon. Will return with formal in-person interpretor later in the day.  ? ?Objective: ?Vitals:  ? 05/19/21 0817 05/19/21 0957 05/19/21 1015 05/19/21 1030  ?BP: (!) 87/46 (!) 95/55 (!) 95/55 (!) 106/50  ?Pulse: 65 76 76 74  ?Resp: '18  18 18  '$ ?Temp: (!) 97.4 ?F (36.3 ?C) 97.9 ?F (36.6 ?C) 97.9 ?F (36.6 ?C) 97.9 ?F (36.6 ?C)  ?TempSrc: Oral  Oral Oral  ?SpO2: 97% 99%  99%  ?Weight:      ?Height:      ? ?Gen: Frail, thin 56 y.o. female in no distress ?Pulm: Nonlabored breathing room air. Clear. ?CV: Regular rate and rhythm. No murmur, rub, or gallop. No JVD, no dependent edema. ?GI: Abdomen soft, non-tender, non-distended, with normoactive bowel sounds.  ?Ext: Warm, no deformities ?Skin: No rashes, lesions or ulcers on visualized skin. Left scapula without visible or palpable deformity.  ?Neuro: Alert and oriented. No acute focal neurological deficits. ?Psych: Judgement and insight appear fair. Mood euthymic & affect congruent. Behavior is appropriate.   ? ?Data Personally reviewed: ?CBC: ?Recent Labs  ?Lab 05/17/21 ?1230 05/19/21 ?0409  ?WBC 2.5* 2.0*  ?HGB 7.9* 6.8*  ?HCT 22.6* 20.0*  ?MCV 87.3 90.1  ?PLT 57* 51*  ? ?Basic  Metabolic Panel: ?Recent Labs  ?Lab 05/17/21 ?1152 05/19/21 ?0409  ?NA 137 140  ?K 3.4* 4.3  ?CL 104 110  ?CO2 24 24  ?GLUCOSE 104* 103*  ?BUN 7 13  ?CREATININE 0.93 1.00  ?CALCIUM 10.0 10.2  ? ?Liver Function Tests: ?Recent Labs  ?Lab 05/17/21 ?1230  ?AST 28  ?ALT 12  ?ALKPHOS 85  ?BILITOT 2.3*  ?PROT 5.5*  ?ALBUMIN 2.8*   ? ?Family Communication: Multiple at bedside and by facetime. ? ?Disposition: ?Status is: Inpatient ?Remains inpatient appropriate because: Intractable pain and needs set up of hospice referral. ?Planned Discharge Destination: Home ? ? ? ? ? ?Patrecia Pour, MD ?05/19/2021 12:44 PM ?Page by [email or web address]  ?

## 2021-05-19 NOTE — Progress Notes (Signed)
[name] [id number] [facility]) hospitalized hospice patient visit ?  ?Ms. [name] is current [name] patient with a terminal diagnosis of Malignant Breast Cancer. She has been having worsening issues with uncontrolled pain and on the afternoon of 5.6.23 decided to present to the emergency room for same. [facility] was notified of plan to go to ER. She was admitted to the hospital on 5.6.23 for uncontrolled pain. Per Dr. Cherie Ouch with [facility] this is a related hospital admission.  ?  ?Patient is deaf and requires interpreter or family for conversation however can also communicate by writing. Fortunately the interpretor was at bedside at the time of my visit. Patient was sitting up in the bed eating her breakfast. She stated that once she is discharged from the hospital she would like to move and have her hospice care transferred to a hospice agency in [place], [place]. She has been experiencing ongoing issues with pain control  in the past as well as side effects of itching with oral medications. MD and Palliative is aware. At this time, we are working on getting the proper care for discharge and the transfer smoothly once the family chooses a hospice agency in [place]. Pain control and symptom management is goal.  ?  ?Patient is inpatient appropriate due to need for IV pain medication, and one unit of blood given. ?  ?Vital Signs-  97.9 , 74 HR,  106/50, spO2 99%  RA ?Intake/Output- 391/0 ?Abnormal labs- K+ 3.4, Chloride 104, Albumin 2.8, Total Protein 5.5, WBC 2.5, RBC 2.59, Hgb 7.9, Hct 22.6, Platelets 57   ?Diagnostics-   ?PORTABLE CHEST 1 VIEW ? COMPARISON:  11/12/2019 ? FINDINGS: ?The heart size and mediastinal contours are within normal limits. ?Power port remains in appropriate position. Both lungs are clear. ?Surgical clips again seen in the right breast and axilla. ? IMPRESSION: ?No active disease. ? Electronically Signed ?  By: Marlaine Hind M.D. ?  On: 05/17/2021 12:24 ?  ?IV/PRN Meds-  MS Contin  '15mg'$  q12H, NS IV 50cc/H, MSO4 '4mg'$ /ml IV x2, Atarax '25mg'$  x2 ?Problem List ?Intractable cancer related pain ?Patient has not done well with her outpatient regimen.  She was hospitalized for further anagement.  Appreciate palliative care assistance with this.  Currently noted to be on long-acting feeding, lidocaine patch along with as needed hydromorphone tablets and as needed morphine injections. ?Complains of itching this morning.  She is noted to be on hydroxyzine for same. ?Chest pain ?Likely due to her cancer.  She does have a history of nonobstructive CAD on cath in 2019.  No indication for cardiac work-up. ?Metastatic breast cancer ?Cancer specific treatments were discontinued recently.  Plan is for hospice.  Life expectancy is a few months. ?Dehydration ?Started on IV fluids.  Cut back on the rate today. ? Pancytopenia ?Secondary to chemotherapy as well as from cancer. ?  ?Discharge Planning- Plan to discharge to [facility] with daughter  ?Family Contact- updated at bedside ?IDT- Updated ?Goals of care - Pain control  ? ?Should patient need ambulance transfer at discharge please use [facility]) as they contract this service for our active hospice patients.  ? ?Clementeen Hoof, BSN, RN ?[facility] Liaison ?[phone] ? ? ? ? ? ?

## 2021-05-19 NOTE — Progress Notes (Signed)
?                                                                                                                                                     ?                                                   ?Palliative Medicine Progress Note  ? ?Patient Name: [name]       Date: 05/19/2021 ?DOB: Oct 07, 1965  Age: 56 y.o. MRN#: [id number] ?Attending Physician: Patrecia Pour, MD ?Primary Care Physician: [name], [name] ?Admit Date: 05/17/2021 ? ?Reason for Consultation/Follow-up: Symptom management ? ?HPI/Patient Profile: ?[name] is a 56 y.o. 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 ? ?Subjective: ?I met at bedside with patient, her husband, and Dr. Bonner Puna.  In-person ASL interpreter was present throughout our discussion. ? ?Symptom management was discussed in detail.  I let [name] know that MS Contin had been restarted yesterday, as this is a medication she takes at home.  She tells me that she did not like the MS Contin as it caused itching.  However, she does not have itching with IV morphine.  ?Discussed that she received a dose of oxycodone IR 5 mg at 1247 today.  She denies itching or other side effects from this medicine.  She reports it did help her pain to some degree, bringing it from a 10 down to a 6.  I let her know that this medication would be continued for her to take as needed, and that dosage would be 5 to 10 mg meaning she could ask for 2 tablets instead of 1.  I encouraged [name] to take the oxycodone IR as often as needed to maintain her comfort.  I provided education that there is also a long-acting form of oxycodone, called OxyContin.  I explained that the starting  dose of OxyContin would be based on how much Oxy codon IR she needed in a 24-hour period. ? ?[name] reports that she has not had a BM in a week.  Recommended a suppository and/or enema; however, [name] declines at this time.  She is willing to try a dose of sorbitol. ? ?[name] expresses a great deal of frustration over the care received in the ER and the fact that an in person interpreter was not utilized.  Her actual reason for coming to the ER was sudden onset of chest pain and tightness, but she was  not necessarily worked up for this.  Dr. Bonner Puna reviews the results of [name]'s PET scan from January 2023.  Discussed presence of skeletal metastasis.  Based on her overall presentation, it is felt that [name]'s chest pain is likely musculoskeletal in nature.  Provided education on the use of corticosteroids and NSAIDs to manage musculoskeletal pain. ? ?[name] confirms that upon discharge from the hospital, she plans to relocate to [place] to live with her daughter.  Apparently this daughter does not work and would be home to help with [name] care.  [name] states that she is "not giving up yet", but does indicate that she understands she has terminal illness.  Her goal is to feel as good as possible for as long as possible. ? ? ?Objective: ? ?Physical Exam ?Vitals reviewed.  ?Constitutional:   ?   Appearance: She is cachectic. She is ill-appearing.  ?Pulmonary:  ?   Effort: Pulmonary effort is normal.  ?Chest:  ?   Comments: Port-a-cath present ?Neurological:  ?   Mental Status: She is alert and oriented to person, place, and time.  ?         ? ?Vital Signs: BP (!) 105/50   Pulse 76   Temp 98 ?F (36.7 ?C) (Oral)   Resp 18   Ht 5' 7" (1.702 m)   Wt 51 kg   SpO2 98%   BMI 17.61 kg/m?  ?SpO2: SpO2: 98 % ?O2 Device: O2 Device: Room Air ?O2 Flow Rate:   ? ? ?LBM: Last BM Date : 05/17/21 ? ?   ?Palliative Assessment/Data: PPS 50% ? ? ? ? ?Palliative Medicine Assessment & Plan  ? ?Assessment: ?Principal Problem: ?   Intractable pain ?Active Problems: ?  [name], right ([place]) ?  Cancer related pain ?  Anemia due to antineoplastic chemotherapy ?  Cancer associated pain ? constipation ? ?Recommendations/Plan: ?D/C Dilaudid ?D/C MS Contin ?Oxycodone IR 5-10 mg every 4 hours prn pain ?Toradol 30 mg IV now x 1 dose ?Dexamethasone 4 mg daily ?Increase senna to 2 tablets BID ?Sorbitol 70% 30 ml x 1 dose ? ? ?Code Status: DNR/DNI as previously documented ? ? ?Prognosis: ? < 6 months ? ?Discharge Planning: ?Home with Hospice ? ? ?Thank you for allowing the Palliative Medicine Team to assist in the care of this patient. ?  ?Greater than 50%  of this time was spent counseling and coordinating care related to the above assessment and plan. ? ?Total time: 68 minutes ?  ? ?Lavena Bullion, NP ?Palliative Medicine ?  ?Please contact Palliative Medicine Team phone at [phone] for questions and concerns.  ?For individual provider, see AMION.  ?  ? ? ? ? ? ? ? ?

## 2021-05-20 ENCOUNTER — Other Ambulatory Visit (HOSPITAL_COMMUNITY): Payer: Self-pay

## 2021-05-20 LAB — CBC
HCT: 27 % — ABNORMAL LOW (ref 36.0–46.0)
Hemoglobin: 9.7 g/dL — ABNORMAL LOW (ref 12.0–15.0)
MCH: 30.6 pg (ref 26.0–34.0)
MCHC: 35.9 g/dL (ref 30.0–36.0)
MCV: 85.2 fL (ref 80.0–100.0)
Platelets: 68 10*3/uL — ABNORMAL LOW (ref 150–400)
RBC: 3.17 MIL/uL — ABNORMAL LOW (ref 3.87–5.11)
RDW: 16.2 % — ABNORMAL HIGH (ref 11.5–15.5)
WBC: 4.3 10*3/uL (ref 4.0–10.5)
nRBC: 0.5 % — ABNORMAL HIGH (ref 0.0–0.2)

## 2021-05-20 LAB — BPAM RBC
Blood Product Expiration Date: 202305132359
ISSUE DATE / TIME: 202305081012
Unit Type and Rh: 5100

## 2021-05-20 LAB — TYPE AND SCREEN
ABO/RH(D): O POS
Antibody Screen: NEGATIVE
Unit division: 0

## 2021-05-20 MED ORDER — HEPARIN SOD (PORK) LOCK FLUSH 100 UNIT/ML IV SOLN
500.0000 [IU] | INTRAVENOUS | Status: AC | PRN
  Administered 2021-05-20: 500 [IU]
  Filled 2021-05-20: qty 5

## 2021-05-20 MED ORDER — DEXAMETHASONE 4 MG PO TABS
4.0000 mg | ORAL_TABLET | Freq: Every day | ORAL | 0 refills | Status: AC
  Filled 2021-05-20: qty 5, 5d supply, fill #0

## 2021-05-20 MED ORDER — SENNOSIDES-DOCUSATE SODIUM 8.6-50 MG PO TABS
2.0000 | ORAL_TABLET | Freq: Two times a day (BID) | ORAL | 0 refills | Status: DC
  Filled 2021-05-20: qty 120, 30d supply, fill #0

## 2021-05-20 MED ORDER — MELOXICAM 15 MG PO TABS
15.0000 mg | ORAL_TABLET | Freq: Every day | ORAL | 0 refills | Status: DC
  Filled 2021-05-20: qty 30, 30d supply, fill #0

## 2021-05-20 MED ORDER — OXYCODONE HCL 5 MG PO TABS
5.0000 mg | ORAL_TABLET | Freq: Four times a day (QID) | ORAL | 0 refills | Status: DC | PRN
Start: 2021-05-20 — End: 2021-08-21
  Filled 2021-05-20: qty 15, 2d supply, fill #0

## 2021-05-20 NOTE — Progress Notes (Signed)
Manufacturing engineer (ACC) ? ?[name] is our current hospice patient. ? ?Tentative plans are to d/c today to [facility] to be closer to her daughter. Per daughter, her husband will likely transport her to [place] at discharge today. ? ?The family plans to choose [facility] to support them in [place]. ? ?[place] social worker, spoke directly with [facility] on 05/19/21 and alerted them that she would likely be arriving on 05/20/21 to their service area. All requested medical records were faxed to [facility] on 05/19/21. Please fax d/c summary with request for follow up with hospice and this should suffice for what [facility] needs.  ? ?Contact for [facility]: ?[facility] ?[address] ?Fax [phone] ? ?Philomena Course is contact @ [place]; [phone] ?[email or web address] ? ?Venia Carbon DNP, RN ?[facility] Liaison  ? ? ? ?

## 2021-05-20 NOTE — Discharge Summary (Addendum)
?Physician Discharge Summary ?  ?Patient: [name] MRN: [id number] DOB: 07-Aug-1965  ?Admit date:     05/17/2021  ?Discharge date: 05/20/21  ?Discharge Physician: Patrecia Pour  ? ?PCP: [name], [name]  ? ?Recommendations at discharge:  ?Follow up with [facility] in [place], [place] (referral made, accepted, and confirmed). Philomena Course is contact @ [place]; [phone]. [email or web address] ?Patient plans to be transported to her daughter's house to stay. Pain medications as prescribed are provided at the bedside prior to discharge.  ? ?Discharge Diagnoses: ?Principal Problem: ?  Intractable pain ?Active Problems: ?  [name], right ([place]) ?  Cancer related pain ?  Anemia due to antineoplastic chemotherapy ?  Cancer associated pain ? ?Hospital Course: ?56 y.o. 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  presented with worsening of whole body aching and chest pain. Work up did not reveal a life-threatening cause of pain, though she was admitted for IV pain control. After work up, suspected cause of chest pain which has largely resolved and is reproducible with palpation is musculoskeletal. Last PET scan in January suggested hypermetabolic uptake throughout the ribs and vertebrae concerning for either GCSF effect, but cannot rule out bony metastatic disease. Anxiety likely contributed and pain is now improved. The patient's pain is stable on oral medications as detailed below. Due to increasing care requirements at home, she is planning to be taken to her oldest daughter's house  outside [place], [place] directly from discharge from the hospital today. Referral for home hospice agency has been made.  ? ?Assessment and Plan: ?Intractable cancer related pain: Side effects, mainly pruritus, limits opioid use though she is tolerating oxycodone and her requirement for opioids seems to have decreased with multimodal pain management.  ?- Continue decadron and mobic for MSK pain.  ?- Rx oxycodone 5-76m po prn mod-severe pain after PDMP review for this hospice patient. This seems to have balanced tolerable side effects with adequate analgesia.  ?- Manage pruritus with hydroxyzine prn.  ? ?Copied below is taken from the latest office visit note from 4/28 with her oncologist (Dr. 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 She had right mastectomy, 4 cycles of AC-T and 5 years of Tamoxifen/AI. ?-In 06/2015 she developed bone metastases. ?-she has had multiple endocrine therapy and chemotherapy, overall does not tolerate treatment well and frequently required dose reduction ?-We switched her treatment to CMF and Keytruda on 08/02/20, and continued herceptin. Due to poor tolerance, I dropped the methotrexate and fluorouracil and delayed her third cycle to 09/20/20, so she received cytoxan, kBeryle Flock and herceptin. She tolerated much better with just some fatigue and mild headaches. ?-restaging PET 10/31/20 unfortunately showed significantly worsening disease. She was switched to trastuzumab and vinorelbine on 11/01/20. She tolerated moderately well with nausea and fatigue, dose reduced after cycle 1  ?-PET scan 02/04/21 showed overall stable disease with some new, small mediastinal and hilar lymph nodes. Her tumor marker is stable, plan to continue chemo  ?-we changed her treatment to once every 2 weeks starting 02/07/21 due to her fatigue and overall  low PS ?-she continues to worsen despite our best efforts  to control her cancer.  Due to her worsening cytopenias, liver function, and poor functional status, I will stop her chemotherapy.  I recommend home hospice.  Our palliative care nurse Lexine Baton discussed hospice again with them today. They are agreeable now, but Jaylnn would like hospice to come to her daughter's house instead. We will make these arrangements for her and continue to manage her symptoms. ?-Her life expectancy is likely a few months ?  ?Chest pain: Likely due to bony metastatic disease as she's significantly tender to palpation. She does have a history of nonobstructive CAD on cath in 2019. Troponin negative in ED. No ischemic ECG changes. No acute changes on CXR.  ?- No indication for cardiac work-up at this time. ? ?Metastatic breast cancer: Cancer specific treatments were discontinued recently.  Plan is for hospice care at home.  Life expectancy is a few months. ?- Follow up with hospice.  ?  ?Dehydration: Improved and now clinically euvolemic without requiring IV fluids.  ?- Megace had been previously prescribed for appetite stimulation, though the patient has not taken it. We will defer appetite stimulant to hospice. ?  ?Pancytopenia: Secondary to chemotherapy as well as from cancer. ?- With concomitant hypotension, suspected symptomatic anemia, 1u PRBC administered 5/8 with improvement in hgb to 9.7g/dl and improvement in symptoms. ? ?Pain control - [employer] Controlled Substance Reporting System database was reviewed. and patient was instructed, not to drive, operate heavy machinery, perform activities at heights, swimming or participation in water activities or provide baby-sitting services while on Pain, Sleep and Anxiety Medications; until their outpatient Physician has advised to do so again. Also recommended to not to take more than prescribed Pain, Sleep and Anxiety Medications.  ? ?Consultants: Palliative care. [facility] home hospice agency was involved in care as  well ?Procedures performed: None  ?Disposition: Relative's home ?Diet recommendation:  ?Regular diet ?DISCHARGE MEDICATION: ?Allergies as of 05/20/2021   ? ?   Reactions  ? Chlorhexidine Itching, Rash  ? Diphenhydramine Hcl Itching, Rash  ? Fentanyl Itching  ? Morphine Itching, Other (See Comments)  ? "Blue tablets" = Severe itching, but can tolerate via IV  ? Other Itching, Other (See Comments)  ? CERTAIN DRESSINGS CAUSE SEVERE ITCHING  ? Phenergan [promethazine] Shortness Of Breath, Itching, Other (See Comments)  ? Skin redness, also- can tolerate Compazine  ? Promethazine Hcl Shortness Of Breath, Itching, Other (See Comments)  ? Skin redness, also- Pt tolerates PO Compazine  ? ?  ? ?  ?Medication List  ?  ? ?STOP taking these medications   ? ?Aleve 220 MG tablet ?Generic drug: naproxen sodium ?  ?fentaNYL 25 MCG/HR ?Commonly known as: [place] ?  ?megestrol 40 MG tablet ?Commonly known as: MEGACE ?  ?zolpidem 10 MG tablet ?Commonly known as: AMBIEN ?  ? ?  ? ?TAKE these medications   ? ?betamethasone valerate ointment 0.1 % ?Commonly known as: VALISONE ?Apply 1 application. topically 2 (two) times daily. ?What changed:  ?when to take this ?additional instructions ?  ?dexamethasone 4 MG tablet ?Commonly known as: DECADRON ?Take 1 tablet (4 mg total) by mouth daily for 5 days. ?What changed: additional instructions ?  ?hydrocortisone 1 % ointment ?Apply 1 application. topically 2 (two) times daily. ?What changed:  ?when to take this ?additional instructions ?  ?hydrOXYzine 25 MG tablet ?Commonly known as: ATARAX ?Take 1 tablet (25 mg total) by mouth every 6 (six) hours as  needed for itching. ?  ?lidocaine 5 % ?Commonly known as: LIDODERM ?Place 1 patch onto the skin daily. Remove & Discard patch within 12 hours or as directed by MD ?  ?lidocaine-prilocaine cream ?Commonly known as: EMLA ?Apply 1 application topically as needed. Apply to Porta-Cath 1-2 hours prior to access as directed. ?What changed:  ?reasons to  take this ?additional instructions ?  ?LORazepam 0.5 MG tablet ?Commonly known as: ATIVAN ?Take 1 tablet (0.5 mg total) by mouth every 8 (eight) hours as needed for anxiety (nausea). ?What changed: reasons to take th

## 2021-05-20 NOTE — Progress Notes (Signed)
Palliative Medicine Progress Note   Patient Name: [name]       Date: 05/20/2021 DOB: [birth year]  Age: 56 y.o. MRN#: [id number] Attending Physician: Patrecia Pour, MD Primary Care Physician: [name], [place] Admit Date: 05/17/2021  Reason for Follow-up: Symptom management, disposition  HPI/Patient Profile: [name] is a 56 y.o. 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    Subjective: I met at bedside with patient, her family, and Dr. Bonner Puna.  In-person ALS interpreter was present throughout our discussion.  After the dose of sorbitol, patient had a BM yesterday 5/8.    She reports her pain is well controlled on current regimen.  Per MAR review, she received 1 dose (4 mg) of IV morphine in the last 24 hours.  Discussed recommendations at discharge.  Patient plans to travel to her daughters house in [place] to stay.  Arrangements have been made for follow-up with [facility] agency in [place], [place].  Referral has been made, accepted, and confirmed.  Discussed that pain medications will be prescribed and delivered to bedside (via [address]) prior to discharge.   Objective:  Physical Exam Constitutional:      General: She is not in acute distress.    Appearance: She is cachectic. She is ill-appearing.  Pulmonary:     Effort: Pulmonary effort is normal.  Neurological:     Mental Status: She is alert and oriented to person, place, and  time.            Vital Signs: BP (!) 95/56 (BP Location: Left Arm)   Pulse 72   Temp 97.9 F (36.6 C) (Oral)   Resp 17   Ht 5' 7"  (1.702 m)   Wt 51 kg   SpO2 98%   BMI 17.61 kg/m  SpO2: SpO2: 98 % O2 Device: O2 Device: Room Air O2 Flow Rate:     LBM: Last BM Date : 05/19/21     Palliative Medicine Assessment & Plan   Assessment: Principal Problem:   Intractable pain Active Problems:   [name], right ([place])  Cancer related pain   Anemia due to antineoplastic chemotherapy   Cancer associated pain    Recommendations/Plan: DNR as previously documented Pending discharge today Arrangements made for home hospice at her daughter's home in [place]  Symptom management (Rx to be provided at discharge per primary service): Decadron 4 mg daily x5 days Lorazepam 0.5 mg every 8 hours as needed for anxiety Meloxicam 15 mg daily Oxycodone 5 to 10 mg every 6 hours as needed for pain Senna-docusate 2 tablets daily Hydroxyzine 25 mg every 6 hours as needed for itching   Prognosis:  < 6 months    Thank you for allowing the Palliative Medicine Team to assist in the care of this patient.   MDM - moderate   Lavena Bullion, NP   Please contact Palliative Medicine Team phone at [phone] for questions and concerns.  For individual providers, please see AMION.

## 2021-05-20 NOTE — Progress Notes (Signed)
Discharge instructions provided to patient with interpreter present. Patient verbalized understanding. Port deaccessed, medications received, and appointments reviewed. Patient belongings gathered. Patient discharged  ?

## 2021-05-21 ENCOUNTER — Encounter: Payer: Self-pay | Admitting: Hematology

## 2021-05-21 ENCOUNTER — Telehealth: Payer: Self-pay

## 2021-05-21 ENCOUNTER — Other Ambulatory Visit: Payer: Self-pay | Admitting: Nurse Practitioner

## 2021-05-21 NOTE — Progress Notes (Signed)
Received a call from Olivia Mackie, [place] Regulatory affairs officer) updating that patient has relocated to [place] to spend her last moments with her family there. Orders for outpatient hospice is needed per request from [facility] who will be providing care. Order to be faxed to [phone] attention Jaclynn Guarneri, RN Executive Director. Her office number is [phone].  ?

## 2021-05-21 NOTE — Telephone Encounter (Signed)
Transition Care Management Follow-up Telephone Call ?Date of discharge and from where: 05/20/2021 from [facility] ?How have you been since you were released from the hospital? Patient stated that she is having trouble sleeping and having heart burn. Patient stated that she can eat but sometimes vomits after.  ?Any questions or concerns? Yes ?Patient was concerned about using a migraine medication stick with the new medications. Patient stated that she is currently in [place] and that Hospice is coming out today.  ?Confirmed patient has the following information:  ?Philomena Course is contact @ [place]; [phone]. [email or web address] ? ?Items Reviewed: ?Did the pt receive and understand the discharge instructions provided? Yes  ?Medications obtained and verified? Yes  ?Other? No  ?Any new allergies since your discharge? No  ?Dietary orders reviewed? No ?Do you have support at home? Yes  ? ?Functional Questionnaire: (I = Independent and D = Dependent) ?ADLs: I on most ?Bathing/Dressing- I ?Meal Prep- I ?Eating- I ?Maintaining continence- I ?Transferring/Ambulation- I with walker, but uses a wheel chair when balance concerns and weakness is present. ?Managing Meds- I ? ?Follow up appointments reviewed: ?PCP Hospital f/u appt confirmed? No  Patient has moved to [place].  ?[facility] f/u appt confirmed? Yes  Followed by Hospice starting 05/21/2021 ?Are transportation arrangements needed? No  ?If their condition worsens, is the pt aware to call PCP or go to the Emergency Dept.? Yes ?Was the patient provided with contact information for the PCP's office or ED? Yes ?Was to pt encouraged to call back with questions or concerns? Yes ? ?

## 2021-05-27 ENCOUNTER — Encounter: Payer: Self-pay | Admitting: Hematology

## 2021-06-23 ENCOUNTER — Encounter: Payer: Self-pay | Admitting: Hematology

## 2021-07-11 ENCOUNTER — Other Ambulatory Visit: Payer: Self-pay

## 2021-07-16 ENCOUNTER — Other Ambulatory Visit: Payer: Self-pay

## 2021-08-17 ENCOUNTER — Observation Stay (HOSPITAL_COMMUNITY): Payer: Medicaid Other

## 2021-08-17 ENCOUNTER — Encounter (HOSPITAL_COMMUNITY): Payer: Self-pay | Admitting: Emergency Medicine

## 2021-08-17 ENCOUNTER — Emergency Department (HOSPITAL_COMMUNITY): Payer: Medicaid Other

## 2021-08-17 ENCOUNTER — Inpatient Hospital Stay (HOSPITAL_COMMUNITY)
Admission: EM | Admit: 2021-08-17 | Discharge: 2021-08-21 | DRG: 947 | Disposition: A | Discharge status: Hospice | Payer: Medicaid Other | Attending: Internal Medicine | Admitting: Internal Medicine

## 2021-08-17 DIAGNOSIS — C7951 Secondary malignant neoplasm of bone: Secondary | ICD-10-CM | POA: Diagnosis present

## 2021-08-17 DIAGNOSIS — G893 Neoplasm related pain (acute) (chronic): Secondary | ICD-10-CM | POA: Diagnosis not present

## 2021-08-17 DIAGNOSIS — C50919 Malignant neoplasm of unspecified site of unspecified female breast: Secondary | ICD-10-CM | POA: Diagnosis present

## 2021-08-17 DIAGNOSIS — C50911 Malignant neoplasm of unspecified site of right female breast: Secondary | ICD-10-CM | POA: Diagnosis not present

## 2021-08-17 DIAGNOSIS — Z888 Allergy status to other drugs, medicaments and biological substances status: Secondary | ICD-10-CM

## 2021-08-17 DIAGNOSIS — Z515 Encounter for palliative care: Secondary | ICD-10-CM | POA: Diagnosis not present

## 2021-08-17 DIAGNOSIS — Z170 Estrogen receptor positive status [ER+]: Secondary | ICD-10-CM

## 2021-08-17 DIAGNOSIS — D61810 Antineoplastic chemotherapy induced pancytopenia: Secondary | ICD-10-CM | POA: Diagnosis present

## 2021-08-17 DIAGNOSIS — E876 Hypokalemia: Secondary | ICD-10-CM | POA: Diagnosis not present

## 2021-08-17 DIAGNOSIS — Z9221 Personal history of antineoplastic chemotherapy: Secondary | ICD-10-CM

## 2021-08-17 DIAGNOSIS — F419 Anxiety disorder, unspecified: Secondary | ICD-10-CM | POA: Diagnosis present

## 2021-08-17 DIAGNOSIS — Z820 Family history of epilepsy and other diseases of the nervous system: Secondary | ICD-10-CM

## 2021-08-17 DIAGNOSIS — Z90710 Acquired absence of both cervix and uterus: Secondary | ICD-10-CM

## 2021-08-17 DIAGNOSIS — Z923 Personal history of irradiation: Secondary | ICD-10-CM

## 2021-08-17 DIAGNOSIS — I959 Hypotension, unspecified: Secondary | ICD-10-CM | POA: Diagnosis present

## 2021-08-17 DIAGNOSIS — Z7401 Bed confinement status: Secondary | ICD-10-CM

## 2021-08-17 DIAGNOSIS — R17 Unspecified jaundice: Secondary | ICD-10-CM | POA: Diagnosis not present

## 2021-08-17 DIAGNOSIS — Z853 Personal history of malignant neoplasm of breast: Secondary | ICD-10-CM

## 2021-08-17 DIAGNOSIS — Z9011 Acquired absence of right breast and nipple: Secondary | ICD-10-CM

## 2021-08-17 DIAGNOSIS — D539 Nutritional anemia, unspecified: Secondary | ICD-10-CM | POA: Diagnosis present

## 2021-08-17 DIAGNOSIS — R9431 Abnormal electrocardiogram [ECG] [EKG]: Secondary | ICD-10-CM | POA: Diagnosis not present

## 2021-08-17 DIAGNOSIS — R110 Nausea: Secondary | ICD-10-CM | POA: Diagnosis not present

## 2021-08-17 DIAGNOSIS — R569 Unspecified convulsions: Secondary | ICD-10-CM | POA: Diagnosis not present

## 2021-08-17 DIAGNOSIS — Z66 Do not resuscitate: Secondary | ICD-10-CM | POA: Diagnosis present

## 2021-08-17 DIAGNOSIS — E871 Hypo-osmolality and hyponatremia: Secondary | ICD-10-CM | POA: Diagnosis present

## 2021-08-17 DIAGNOSIS — R531 Weakness: Secondary | ICD-10-CM | POA: Diagnosis not present

## 2021-08-17 DIAGNOSIS — R079 Chest pain, unspecified: Secondary | ICD-10-CM | POA: Diagnosis not present

## 2021-08-17 DIAGNOSIS — D61818 Other pancytopenia: Secondary | ICD-10-CM | POA: Diagnosis present

## 2021-08-17 DIAGNOSIS — I2510 Atherosclerotic heart disease of native coronary artery without angina pectoris: Secondary | ICD-10-CM | POA: Diagnosis present

## 2021-08-17 DIAGNOSIS — Z791 Long term (current) use of non-steroidal anti-inflammatories (NSAID): Secondary | ICD-10-CM

## 2021-08-17 DIAGNOSIS — T451X5A Adverse effect of antineoplastic and immunosuppressive drugs, initial encounter: Secondary | ICD-10-CM | POA: Diagnosis present

## 2021-08-17 DIAGNOSIS — D649 Anemia, unspecified: Secondary | ICD-10-CM | POA: Diagnosis present

## 2021-08-17 DIAGNOSIS — R109 Unspecified abdominal pain: Secondary | ICD-10-CM | POA: Diagnosis not present

## 2021-08-17 DIAGNOSIS — Z79899 Other long term (current) drug therapy: Secondary | ICD-10-CM

## 2021-08-17 DIAGNOSIS — H905 Unspecified sensorineural hearing loss: Secondary | ICD-10-CM | POA: Diagnosis present

## 2021-08-17 DIAGNOSIS — Z8249 Family history of ischemic heart disease and other diseases of the circulatory system: Secondary | ICD-10-CM

## 2021-08-17 DIAGNOSIS — Z885 Allergy status to narcotic agent status: Secondary | ICD-10-CM

## 2021-08-17 LAB — PREPARE RBC (CROSSMATCH)

## 2021-08-17 LAB — PHOSPHORUS: Phosphorus: 4.8 mg/dL — ABNORMAL HIGH (ref 2.5–4.6)

## 2021-08-17 LAB — URINALYSIS, ROUTINE W REFLEX MICROSCOPIC
Bilirubin Urine: NEGATIVE
Glucose, UA: NEGATIVE mg/dL
Hgb urine dipstick: NEGATIVE
Ketones, ur: NEGATIVE mg/dL
Leukocytes,Ua: NEGATIVE
Nitrite: NEGATIVE
Protein, ur: NEGATIVE mg/dL
Specific Gravity, Urine: 1.013 (ref 1.005–1.030)
pH: 5 (ref 5.0–8.0)

## 2021-08-17 LAB — FOLATE: Folate: 6.8 ng/mL (ref >5.9)

## 2021-08-17 LAB — AMMONIA: Ammonia: 27 umol/L (ref 9–35)

## 2021-08-17 LAB — COMPREHENSIVE METABOLIC PANEL
ALT: 27 U/L (ref 0–44)
AST: 28 U/L (ref 15–41)
Albumin: 2.8 g/dL — ABNORMAL LOW (ref 3.5–5.0)
Alkaline Phosphatase: 90 U/L (ref 38–126)
Anion gap: 11 (ref 5–15)
BUN: 39 mg/dL — ABNORMAL HIGH (ref 6–20)
CO2: 20 mmol/L — ABNORMAL LOW (ref 22–32)
Calcium: 8.2 mg/dL — ABNORMAL LOW (ref 8.9–10.3)
Chloride: 100 mmol/L (ref 98–111)
Creatinine, Ser: 1.08 mg/dL — ABNORMAL HIGH (ref 0.44–1.00)
GFR, Estimated: >60 mL/min (ref >60)
Glucose, Bld: 85 mg/dL (ref 70–99)
Potassium: 3.3 mmol/L — ABNORMAL LOW (ref 3.5–5.1)
Sodium: 131 mmol/L — ABNORMAL LOW (ref 135–145)
Total Bilirubin: 1.1 mg/dL (ref 0.3–1.2)
Total Protein: 5.4 g/dL — ABNORMAL LOW (ref 6.5–8.1)

## 2021-08-17 LAB — FERRITIN: Ferritin: 1462 ng/mL — ABNORMAL HIGH (ref 11–307)

## 2021-08-17 LAB — IRON AND TIBC
Iron: 253 ug/dL — ABNORMAL HIGH (ref 28–170)
Saturation Ratios: 89 % — ABNORMAL HIGH (ref 10.4–31.8)
TIBC: 284 ug/dL (ref 250–450)
UIBC: 31 ug/dL

## 2021-08-17 LAB — PHENYTOIN LEVEL, TOTAL: Phenytoin Lvl: <2.5 ug/mL — ABNORMAL LOW (ref 10.0–20.0)

## 2021-08-17 LAB — BLOOD GAS, VENOUS
Acid-base deficit: 1.1 mmol/L (ref 0.0–2.0)
Bicarbonate: 22.4 mmol/L (ref 20.0–28.0)
Patient temperature: 37
pCO2, Ven: 33 mmHg — ABNORMAL LOW (ref 44–60)
pH, Ven: 7.44 — ABNORMAL HIGH (ref 7.25–7.43)
pO2, Ven: <32 mmHg — CL (ref 32–45)

## 2021-08-17 LAB — VITAMIN B12: Vitamin B-12: 709 pg/mL (ref 180–914)

## 2021-08-17 LAB — PROTIME-INR
INR: 1.1 (ref 0.8–1.2)
Prothrombin Time: 13.7 seconds (ref 11.4–15.2)

## 2021-08-17 LAB — TROPONIN I (HIGH SENSITIVITY)
Troponin I (High Sensitivity): 6 ng/L (ref <18)
Troponin I (High Sensitivity): 7 ng/L (ref <18)

## 2021-08-17 LAB — LIPASE, BLOOD: Lipase: 27 U/L (ref 11–51)

## 2021-08-17 LAB — CK: Total CK: 34 U/L — ABNORMAL LOW (ref 38–234)

## 2021-08-17 LAB — MAGNESIUM: Magnesium: 2.3 mg/dL (ref 1.7–2.4)

## 2021-08-17 LAB — LACTIC ACID, PLASMA: Lactic Acid, Venous: 0.6 mmol/L (ref 0.5–1.9)

## 2021-08-17 MED ORDER — HYDROMORPHONE HCL 1 MG/ML IJ SOLN
1.0000 mg | Freq: Once | INTRAMUSCULAR | Status: AC
  Administered 2021-08-17: 1 mg via INTRAVENOUS
  Filled 2021-08-17: qty 1

## 2021-08-17 MED ORDER — SODIUM CHLORIDE 0.9% FLUSH: 10.0000 mL | INTRAVENOUS | Status: DC | PRN

## 2021-08-17 MED ORDER — LACTATED RINGERS IV BOLUS
500.0000 mL | Freq: Once | INTRAVENOUS | Status: AC
  Administered 2021-08-17: 500 mL via INTRAVENOUS

## 2021-08-17 MED ORDER — SODIUM CHLORIDE 0.9% FLUSH
10.0000 mL | Freq: Two times a day (BID) | INTRAVENOUS | Status: DC
  Administered 2021-08-18: 10 mL
  Administered 2021-08-18 (×2): 20 mL
  Administered 2021-08-19 – 2021-08-20 (×4): 10 mL

## 2021-08-17 MED ORDER — ONDANSETRON HCL 4 MG/2ML IJ SOLN
4.0000 mg | Freq: Once | INTRAMUSCULAR | Status: AC
  Administered 2021-08-17: 4 mg via INTRAVENOUS
  Filled 2021-08-17: qty 2

## 2021-08-17 MED ORDER — POTASSIUM CHLORIDE 10 MEQ/100ML IV SOLN
10.0000 meq | INTRAVENOUS | Status: AC
  Administered 2021-08-17 – 2021-08-18 (×2): 10 meq via INTRAVENOUS
  Filled 2021-08-17 (×2): qty 100

## 2021-08-17 MED ORDER — SODIUM CHLORIDE 0.9% IV SOLUTION: Freq: Once | INTRAVENOUS | Status: DC

## 2021-08-17 MED ORDER — SODIUM CHLORIDE 0.9 % IV SOLN
10.0000 mL/h | Freq: Once | INTRAVENOUS | Status: AC
  Administered 2021-08-18: 10 mL/h via INTRAVENOUS

## 2021-08-17 NOTE — Assessment & Plan Note (Signed)
Obtain anemia panel we will transfuse 2 units discussed at length with family and patient who at this point states that she does not want to have blood transfusions Hemoglobin was unable to be read because it was reported less than 3 Patient denies any GI bleeding no melena no blood per rectum Obtain anemia panel Follow CBC

## 2021-08-17 NOTE — ED Triage Notes (Signed)
Pt arrives via EMS from home with reports of generalized body pain and nausea. Pt has breast cancer that has mets to bone.

## 2021-08-17 NOTE — Assessment & Plan Note (Addendum)
Family states she have had seizures last was in August Is on phenytoin and decadrone for this Order a phenytoin level Check CT head no imaging here to see if there was any brain mets Order seizure precautions May need MRI of the brain w/wo contrast once able to cooperate in AM

## 2021-08-17 NOTE — Subjective & Objective (Signed)
Patient with known history of breast cancer She lived in [place] for some time but recently moved back to [place] to live with her husband.  She has been having severe pain related to her breast cancer with mets to the bone.  She is already on hospice care. Patient taking oxycodone every 6 hours but still continues to have significant pain

## 2021-08-17 NOTE — Assessment & Plan Note (Signed)
Chronic-stable.

## 2021-08-17 NOTE — ED Notes (Signed)
Lab reports repeat CBC still will not run, continues to get error message

## 2021-08-17 NOTE — ED Notes (Signed)
Lab states CBC will not run, EDP made aware

## 2021-08-17 NOTE — Assessment & Plan Note (Addendum)
Restart methadone and oxycodone Avoid IV narcotics in the setting of hypotension  Palliative care consult email oncology that pt has been admitted

## 2021-08-17 NOTE — ED Provider Notes (Addendum)
[facility] DEPT Provider Note   CSN: [id number] Arrival date & time: 08/17/21  1238     History  Chief Complaint  Patient presents with   Pain    [name] is a 56 y.o. female.  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  Utilized Geologist, engineering throughout visit.  Patient is deaf.  She back in May was admitted for pain control.  Discharged on home hospice.  Patient went to live with her daughter in [place] and had transferred her hospice care to an agency in [place].  A few days ago patient moved back to [place], currently living with her husband.  Does not have any hospice resources currently set up at this time.  She has been using oxycodone for her pain.  Patient states that she has pain throughout the entirety of her body.  Not well controlled.  No fevers or chills.  No chest pain or difficulty in breathing.  Extensive chart review completed, last admission in May 2023.  Hospice patient with history of metastatic breast cancer, chemotherapy-induced pancytopenia.  Reviewed palliative care note.  Patient DNR/DNI, comfort measures.  Patient and husband confirm that she is a hospice patient, confirmed DNR/DNI status, confirmed goal of her care at this time is comfort, not prolonging life.  HPI     Home Medications Prior to Admission medications   Medication Sig Start Date End Date Taking? Authorizing Provider  dexamethasone (DECADRON) 4 MG tablet Take 4 mg by mouth 2 (two) times daily.   Yes [provider]  methadone (DOLOPHINE) 5 MG tablet Take 5 mg by mouth daily.   Yes [provider]  mirtazapine (REMERON) 7.5 MG tablet Take 7.5 mg by mouth at bedtime.   Yes [provider]  omeprazole (PRILOSEC) 20 MG capsule Take 20 mg by mouth daily.   Yes [provider]  ondansetron (ZOFRAN) 4 MG tablet Take 4 mg by mouth every 8 (eight) hours as needed for nausea or  vomiting.   Yes [provider]  phenytoin (DILANTIN) 100 MG ER capsule Take 100 mg by mouth 2 (two) times daily.   Yes [provider]  temazepam (RESTORIL) 30 MG capsule Take 30 mg by mouth at bedtime as needed for sleep.   Yes [provider]  betamethasone valerate ointment (VALISONE) 0.1 % Apply 1 application. topically 2 (two) times daily. Patient taking differently: Apply 1 application. topically See admin instructions. Apply to affected areas 2 times a day 05/10/21   Pickenpack-Cousar, Carlena Sax, NP  hydrocortisone 1 % ointment Apply 1 application. topically 2 (two) times daily. Patient taking differently: Apply 1 application. topically See admin instructions. Apply to affected areas 2 times a day 05/09/21   Pickenpack-Cousar, Carlena Sax, NP  hydrOXYzine (ATARAX) 25 MG tablet Take 1 tablet (25 mg total) by mouth every 6 (six) hours as needed for itching. Patient not taking: Reported on 05/18/2021 05/09/21   Pickenpack-Cousar, Carlena Sax, NP  lidocaine (LIDODERM) 5 % Place 1 patch onto the skin daily. Remove & Discard patch within 12 hours or as directed by MD Patient not taking: Reported on 05/18/2021 02/04/21   Pickenpack-Cousar, Carlena Sax, NP  lidocaine-prilocaine (EMLA) cream Apply 1 application topically as needed. Apply to Porta-Cath 1-2 hours prior to access as directed. Patient taking differently: Apply 1 application  topically as needed (to Porta-Cath, 1-2 hours prior to access, as directed). 03/07/21   Truitt Merle, MD  LORazepam (ATIVAN) 0.5 MG tablet Take 1 tablet (0.5 mg total) by mouth every 8 (eight) hours as needed for anxiety (nausea). Patient taking differently: Take 0.5 mg by mouth every 8 (eight) hours as needed for anxiety (or nausea). 04/18/21   Pickenpack-Cousar, Carlena Sax, NP  meloxicam (MOBIC) 15 MG tablet Take 1 tablet (15 mg total) by mouth daily. 05/20/21   Patrecia Pour, MD  oxyCODONE (OXY IR/ROXICODONE) 5 MG immediate release tablet Take 1-2 tablets (5-10 mg  total) by mouth every 6 (six) hours as needed for severe pain or moderate pain. 05/20/21   Patrecia Pour, MD  potassium chloride (KLOR-CON) 10 MEQ tablet Take 1 tablet (10 mEq total) by mouth 3 (three) times daily. 05/17/20   Truitt Merle, MD  prochlorperazine (COMPAZINE) 10 MG tablet TAKE 1 TABLET(10 MG) BY MOUTH EVERY 6 HOURS AS NEEDED FOR NAUSEA OR VOMITING Patient taking differently: Take 10 mg by mouth every 6 (six) hours as needed for vomiting or nausea. 09/23/20   Truitt Merle, MD  senna-docusate (SENOKOT-S) 8.6-50 MG tablet Take 2 tablets by mouth 2 (two) times daily. 05/20/21   Patrecia Pour, MD      Allergies    Chlorhexidine, Diphenhydramine hcl, Fentanyl, Morphine, Other, Phenergan [promethazine], and Promethazine hcl    Review of Systems   Review of Systems  Constitutional:  Negative for chills and fever.  HENT:  Negative for ear pain and sore throat.   Eyes:  Negative for pain and visual disturbance.  Respiratory:  Negative for cough and shortness of breath.   Cardiovascular:  Positive for chest pain. Negative for palpitations.  Gastrointestinal:  Negative for abdominal pain and vomiting.  Genitourinary:  Negative for dysuria and hematuria.  Musculoskeletal:  Positive for arthralgias, back pain and myalgias.  Skin:  Negative for color change and rash.  Neurological:  Negative for seizures and syncope.  All other systems reviewed and are negative.   Physical Exam Updated Vital Signs BP (!) 94/49   Pulse 82   Temp 98 F (36.7 C)   Resp 13   Ht '5\' 7"'$  (1.702 m)   Wt 50 kg   SpO2 100%   BMI 17.26 kg/m  Physical Exam Vitals and nursing note reviewed.  Constitutional:      Appearance: She is well-developed.     Comments: Pale, cachectic, chronically ill-appearing  HENT:     Head: Normocephalic and atraumatic.  Eyes:     Comments: Pale conjunctiva  Cardiovascular:     Rate and Rhythm: Normal rate and regular rhythm.     Pulses: Normal pulses.  Pulmonary:     Effort:  Pulmonary effort is normal. No respiratory distress.     Breath sounds: Normal breath sounds.  Abdominal:     Palpations: Abdomen is soft.     Tenderness: There is no abdominal tenderness.  Musculoskeletal:        General: No swelling.     Cervical back: Neck supple.  Skin:    General: Skin is warm and dry.     Capillary Refill: Capillary refill takes less than 2 seconds.     Coloration: Skin is pale.  Neurological:     Mental Status: She is alert.  Psychiatric:        Mood and Affect: Mood normal.     ED Results / Procedures / Treatments   Labs (all labs ordered are listed, but only abnormal results are displayed) Labs Reviewed  COMPREHENSIVE METABOLIC PANEL - Abnormal; Notable for the  following components:      Result Value   Sodium 131 (*)    Potassium 3.3 (*)    CO2 20 (*)    BUN 39 (*)    Creatinine, Ser 1.08 (*)    Calcium 8.2 (*)    Total Protein 5.4 (*)    Albumin 2.8 (*)    All other components within normal limits  PROTIME-INR  LIPASE, BLOOD  MAGNESIUM  URINALYSIS, ROUTINE W REFLEX MICROSCOPIC  CBC WITH DIFFERENTIAL/PLATELET  CBC WITH DIFFERENTIAL/PLATELET  CBC WITH DIFFERENTIAL/PLATELET  CBC WITH DIFFERENTIAL/PLATELET  PHENYTOIN LEVEL, TOTAL  I-STAT CHEM 8, ED  TYPE AND SCREEN  PREPARE RBC (CROSSMATCH)  TROPONIN I (HIGH SENSITIVITY)  TROPONIN I (HIGH SENSITIVITY)    EKG EKG Interpretation  Date/Time:  Sunday August 17 2021 12:56:26 EDT Ventricular Rate:  91 PR Interval:  142 QRS Duration: 98 QT Interval:  386 QTC Calculation: 475 R Axis:   12 Text Interpretation: Sinus rhythm RSR' in V1 or V2, probably normal variant Nonspecific T abnormalities, lateral leads Confirmed by Madalyn Rob [phone]) on 08/17/2021 2:19:30 PM  Radiology DG Chest Portable 1 View  Result Date: 08/17/2021 CLINICAL DATA:  chest pain EXAM: PORTABLE CHEST 1 VIEW COMPARISON:  March 17, 2021 FINDINGS: The cardiomediastinal silhouette is unchanged in contour.LEFT chest port  with tip terminating over the superior cavoatrial junction. No pleural effusion. No pneumothorax. No acute pleuroparenchymal abnormality. Visualized abdomen is unremarkable. RIGHT-sided surgical clips. Known osseous metastatic disease is not well visualized radiographically. IMPRESSION: No acute cardiopulmonary abnormality. Electronically Signed   By: Valentino Saxon M.D.   On: 08/17/2021 15:05    Procedures .Critical Care  Performed by: Lucrezia Starch, MD Authorized by: Lucrezia Starch, MD   Critical care provider statement:    Critical care time (minutes):  55   Critical care was necessary to treat or prevent imminent or life-threatening deterioration of the following conditions:  Shock and circulatory failure   Critical care was time spent personally by me on the following activities:  Development of treatment plan with patient or surrogate, discussions with consultants, evaluation of patient's response to treatment, examination of patient, ordering and review of laboratory studies, ordering and review of radiographic studies, ordering and performing treatments and interventions, pulse oximetry, re-evaluation of patient's condition and review of old charts     Medications Ordered in ED Medications  sodium chloride flush (NS) 0.9 % injection 10-40 mL ( Intracatheter Not Given 08/17/21 1534)  sodium chloride flush (NS) 0.9 % injection 10-40 mL (has no administration in time range)  0.9 %  sodium chloride infusion (has no administration in time range)  ondansetron (ZOFRAN) injection 4 mg (4 mg Intravenous Given 08/17/21 1436)  HYDROmorphone (DILAUDID) injection 1 mg (1 mg Intravenous Given 08/17/21 1436)  lactated ringers bolus 500 mL (0 mLs Intravenous Stopped 08/17/21 1553)  HYDROmorphone (DILAUDID) injection 1 mg (1 mg Intravenous Given 08/17/21 1716)    ED Course/ Medical Decision Making/ A&P                           Medical Decision Making Amount and/or Complexity of Data  Reviewed Labs: ordered. Radiology: ordered.  Risk Prescription drug management. Decision regarding hospitalization.   56 year old lady with metastatic breast cancer, chemotherapy-induced pancytopenia presenting to ER for pain.  Notably recently moved back from [place] to [place], not currently enrolled in any hospice services here in [place].  Patient appeared very pale but was alert  and not in any obvious distress though she was uncomfortable from her pain.  Lengthy discussion with patient and her husband at bedside regarding goals of care.  Confirmed that at this time the focus of her care is her comfort, confirmed DNR status.  Check basic labs.  Lab report issues attempting to run the CBC, most likely indicating that her hemoglobin is critically low below what the machine will detect which would be potentially less than 3.  Based on patient's physical exam, this certainly is possible.  I discussed the critical low hemoglobin level with patient and husband.  Offered blood transfusion, they declined, want focus to be comfort measures only.  Given her overall presentation I feel this is very reasonable.  I discussed with Dr. Hilma Favors with the palliative medicine team, their team will evaluate patient tomorrow.  Additionally discussed with Misty with [facility] who confirmed that patient was not actively enrolled in their services and patient would have to be reenrolled.  Patient is required multiple doses of IV analgesia for pain control, feel she would benefit from admission for pain control as well as consultation with palliative medicine, hospice, case management to determine disposition plan for patient.  Have discussed with [facility] - she will admit.   Has congenital deafness, utilized Geologist, engineering throughout visit both using Marketing executive and in person translator when she was available in the ER.   ADDENDUM -Dr. Roel Cluck evaluated patient, she spoke with patient, husband and  daughter.  [facility] informed me that patient does in fact want full scope of treatment, full code including blood transfusion.  She request that CCM be consulted given this change in goals of care and patient's soft blood pressures and critically low hemoglobin.  I discussed with Dr. Patsey Berthold who will discuss further with Dr. Roel Cluck regarding the appropriate level of care for patient this evening.  I reassessed patient and discussed goals of care with patient, husband and daughter.  They now state they do want blood transfusion, full scope of treatment.  They remain open to further discussions with palliative care.  Daughter did confirm that she was enrolled in hospice care in [place].  After futher discussion with D[facility] and Patsey Berthold, pt will be admitted to step down unit for now. CCM available if any decompensation occurs.  Final Clinical Impression(s) / ED Diagnoses Final diagnoses:  End of life care  Palliative care encounter  Anemia, unspecified type    Rx / DC Orders ED Discharge Orders     None         Lucrezia Starch, MD 08/17/21 1945    Lucrezia Starch, MD 08/17/21 2100    Lucrezia Starch, MD 08/17/21 2102

## 2021-08-17 NOTE — ED Notes (Signed)
Lav bullet taken to lab in attempt to run CBC

## 2021-08-17 NOTE — ED Notes (Signed)
Pt given a coke, apple sauce and saltine crackers.

## 2021-08-17 NOTE — Assessment & Plan Note (Addendum)
Palliative care consult Patient has been seen in April by Dr. Burr Medico will let them know if she has been admitted. Pain management as needed Pt at this time says she wants to be FULL CODE  family states she was never supposed to be DNR int he first place

## 2021-08-17 NOTE — ED Notes (Signed)
Pt placed on 3L of O2

## 2021-08-17 NOTE — ED Notes (Signed)
Lab reports CBC will not run and they keep getting a error message. EDP made aware and repeat sent

## 2021-08-17 NOTE — Assessment & Plan Note (Addendum)
Suspect likely chronic as patient does appear to be functionally deteriorating. Restart Decadron Administer blood transfusion this should help with blood pressure. Suspect albumin is even lower may need an albumin infusion that would support her blood pressure as well Overall very poor prognosis Would benefit from further discussion palliative care consult PCCM is aware will see if pt decompensates

## 2021-08-17 NOTE — Assessment & Plan Note (Signed)
We will replace and repeat in a.m.

## 2021-08-17 NOTE — H&P (Signed)
[name] OVH:[id number] DOB: 1965/11/08 DOA: 08/17/2021     PCP: Kirt Boys, [name]   Outpatient Specialists:      Oncology   Dr. Mosetta Putt    Patient arrived to ER on 08/17/21 at 1238 Referred by Attending Milagros Loll, MD   Patient coming from:    home Lives  With family    Chief Complaint:   Chief Complaint  Patient presents with   Pain    HPI: [name] is a 56 y.o. female with medical history significant of breast cancer metastatic to the bone    Presented with generalized pain Patient with known history of breast cancer She lived in [place] for some time but recently moved back to [place] to live with her husband.  She has been having severe pain related to her breast cancer with mets to the bone.  She is already on hospice care. Patient taking oxycodone every 6 hours but still continues to have significant pain Initially ER provider had extensive discussion using sign language interpreter with family and patient initially family confirmed the patient wishes to be DO NOT RESUSCITATE and wanted to concentrate on comfort care did not want to have any blood transfusions.  When I came to her room and attempted to reconfirm this patient at this point stated that she wished to be full code does want to have blood products does want to be intubated in case she needs it.  Wants full resuscitation measures. I have spent a total 45 minutes in depth discussing this with family husband as well as daughter using sign language interpreter help Family has confirmed the patient at this point wish to be full code wish to have blood transfusion Daughter states she was not aware of DO NOT RESUSCITATE on chart  Family and patient states there have not been any fevers or chills she has had progressive decline patient initially was discharged to [place] but has traveled back to [place] to be with her husband.  Husband states that he is unable to take  care of her he works at during the day he is unable to take care of her pain. Husband also states he cannot make medical decisions for patient he wishes her daughters to do so.  patient does not have medical power of attorney       Regarding pertinent Chronic problems:    Breast cancer was followed by [place] [facility] in [place] Per family patient was now a DO NOT RESUSCITATE although she was getting what appear to be comfort concentrated care. At this point family and patient wishes to be full code Patient was taken Decadron for musculoskeletal pain as well as oxycodone and methadone.   Accordign to Dr. 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 She had right mastectomy, 4 cycles of AC-T and 5 years of Tamoxifen/AI. -In 06/2015 she developed bone metastases. -she has had multiple endocrine therapy and chemotherapy, overall does not tolerate treatment well and frequently required dose reduction -We switched her treatment to CMF and Keytruda on 08/02/20, and continued herceptin. Due to poor tolerance, I dropped the methotrexate and fluorouracil and delayed her third cycle to 09/20/20, so she received cytoxan, Rande Lawman, and herceptin. She tolerated much better with just some fatigue and mild headaches. -restaging PET 10/31/20 unfortunately showed significantly worsening disease. She was switched to trastuzumab and vinorelbine on 11/01/20.  She tolerated moderately well with nausea and fatigue, dose reduced after cycle 1  -PET scan 02/04/21 showed overall stable disease with some new, small mediastinal and hilar lymph nodes. Her tumor marker is stable, plan to continue chemo  -we changed her treatment to once every 2 weeks starting 02/07/21 due to her fatigue and overall low PS -she continues to worsen despite our best efforts to control her cancer.  Due to her worsening cytopenias, liver  function, and poor functional status, I will stop her chemotherapy.  I recommend home hospice.  Our palliative care nurse Lexine Baton discussed hospice again with them today. They are agreeable now, but [name] would like hospice to come to her daughter's house instead. We will make these arrangements for her and continue to manage her symptoms. -Her life expectancy is likely a few months   Chest pain: Likely due to bony metastatic disease as she's significantly tender to palpation. She does have a history of nonobstructive CAD on cath in 2019. Troponin negative in ED. No ischemic ECG changes. No acute changes on CXR.  - No indication for cardiac work-up at this time.  Metastatic breast cancer: Cancer specific treatments were discontinued recently.  Plan is for hospice care at home.  Life expectancy is a few months. - Follow up with hospice   Chronic anemia - baseline hg Hemoglobin & Hematocrit  Recent Labs    05/17/21 1230 05/19/21 0409 05/20/21 0919  HGB 7.9* 6.8* 9.7*     While in ER:   CBC obtained but was unable to run as its likely less than 3.    CXR -  NON acute   Following Medications were ordered in ER: Medications  sodium chloride flush (NS) 0.9 % injection 10-40 mL ( Intracatheter Not Given 08/17/21 1534)  sodium chloride flush (NS) 0.9 % injection 10-40 mL (has no administration in time range)  ondansetron (ZOFRAN) injection 4 mg (4 mg Intravenous Given 08/17/21 1436)  HYDROmorphone (DILAUDID) injection 1 mg (1 mg Intravenous Given 08/17/21 1436)  lactated ringers bolus 500 mL (0 mLs Intravenous Stopped 08/17/21 1553)  HYDROmorphone (DILAUDID) injection 1 mg (1 mg Intravenous Given 08/17/21 1716)    _______________________________________________________ ER Provider Called: Palliative care Dr. Hilma Favors They Recommend admit to medicine   Will see in AM      ED Triage Vitals  Enc Vitals Group     BP 08/17/21 1257 (!) 97/54     Pulse Rate 08/17/21 1257 92     Resp 08/17/21 1257 (!)  26     Temp 08/17/21 1259 98.1 F (36.7 C)     Temp Source 08/17/21 1259 Oral     SpO2 08/17/21 1257 100 %     Weight 08/17/21 1258 110 lb 3.7 oz (50 kg)     Height 08/17/21 1258 $RemoveBefor'5\' 7"'osYOrnVDogfB$  (1.702 m)     Head Circumference --      Peak Flow --      Pain Score 08/17/21 1257 10     Pain Loc --      Pain Edu? --      Excl. in [place]? --   TMAX(24)@     _________________________________________ Significant initial  Findings: Abnormal Labs Reviewed  COMPREHENSIVE METABOLIC PANEL - Abnormal; Notable for the following components:      Result Value   Sodium 131 (*)    Potassium 3.3 (*)    CO2 20 (*)    BUN 39 (*)    Creatinine, Ser 1.08 (*)  Calcium 8.2 (*)    Total Protein 5.4 (*)    Albumin 2.8 (*)    All other components within normal limits      ECG: Ordered Personally reviewed and interpreted by me showing: HR : 91 Rhythm: Sinus rhythm RSR' in V1 or V2, probably normal variant Nonspecific T abnormalities, lateral leads QTC 475    The recent clinical data is shown below. Vitals:   08/17/21 1700 08/17/21 1715 08/17/21 1730 08/17/21 1745  BP: (!) 89/43 (!) 86/44 (!) 86/45 (!) 85/41  Pulse: 95  92 95  Resp: 16 15 (!) 22 18  Temp:    98 F (36.7 C)  TempSrc:      SpO2: 99%  91% 98%  Weight:      Height:          WBC     Component Value Date/Time   WBC 4.3 05/20/2021 0919   LYMPHSABS 0.7 05/09/2021 0933   LYMPHSABS 2.0 01/08/2017 0909   MONOABS 0.3 05/09/2021 0933   MONOABS 0.3 01/08/2017 0909   EOSABS 0.1 05/09/2021 0933   EOSABS 0.1 01/08/2017 0909   EOSABS 0.1 07/24/2014 1604   BASOSABS 0.0 05/09/2021 0933   BASOSABS 0.0 01/08/2017 2542      _______________________________________________ Hospitalist was called for admission for   End of life care  Cancer related pain and symptomatic anemia    The following Work up has been ordered so far:  Orders Placed This Encounter  Procedures   DG Chest Portable 1 View   CBC with Differential   Comprehensive  metabolic panel   Protime-INR   Lipase, blood   Magnesium   CBC with Differential/Platelet   Urinalysis, Routine w reflex microscopic   CBC with Differential/Platelet   Refer to Sidebar Report - [place]   Patient education (specify): - [facility] Daily CHG Bathing   Apply neutral pressure cap   May draw labs from central line   For first catheter occlusion notify IV team ([place], [place]) or provider at all other sites.   May access port-a-cath if present   Consult to Palliative Care   Consult to Transition of Care Team (SW and CM)   Consult to hospitalist   I-stat chem 8, ED (not at [facility] or [facility])   ED EKG   EKG 12-Lead   Type and screen [place]   Routine line care     OTHER Significant initial  Findings:  labs showing:    Recent Labs  Lab 08/17/21 1410 08/17/21 1421  NA 131*  --   K 3.3*  --   CO2 20*  --   GLUCOSE 85  --   BUN 39*  --   CREATININE 1.08*  --   CALCIUM 8.2*  --   MG  --  2.3    Cr  stable,    Lab Results  Component Value Date   CREATININE 1.08 (H) 08/17/2021   CREATININE 1.00 05/19/2021   CREATININE 0.93 05/17/2021    Recent Labs  Lab 08/17/21 1410  AST 28  ALT 27  ALKPHOS 90  BILITOT 1.1  PROT 5.4*  ALBUMIN 2.8*   Lab Results  Component Value Date   CALCIUM 8.2 (L) 08/17/2021   PHOS 3.4 07/02/2015          Plt: Lab Results  Component Value Date   PLT 68 (L) 05/20/2021    HG/HCT     repeat hemoglobin today was and less than 3 lab could  not record    Component Value Date/Time   HGB 9.7 (L) 05/20/2021 0919   HGB 8.2 (L) 05/09/2021 0933   HGB 11.9 01/08/2017 0909   HCT 27.0 (L) 05/20/2021 0919   HCT 34.8 01/08/2017 0909   MCV 85.2 05/20/2021 0919   MCV 85.2 01/08/2017 0909      Recent Labs  Lab 08/17/21 1410  LIPASE 27         Cultures: No results found for: "SDES", "SPECREQUEST", "CULT", "REPTSTATUS"   Radiological Exams on Admission: DG Chest Portable 1 View  Result Date:  08/17/2021 CLINICAL DATA:  chest pain EXAM: PORTABLE CHEST 1 VIEW COMPARISON:  March 17, 2021 FINDINGS: The cardiomediastinal silhouette is unchanged in contour.LEFT chest port with tip terminating over the superior cavoatrial junction. No pleural effusion. No pneumothorax. No acute pleuroparenchymal abnormality. Visualized abdomen is unremarkable. RIGHT-sided surgical clips. Known osseous metastatic disease is not well visualized radiographically. IMPRESSION: No acute cardiopulmonary abnormality. Electronically Signed   By: Meda Klinefelter M.D.   On: 08/17/2021 15:05   _______________________________________________________________________________________________________ Latest   Blood pressure (!) 85/41, pulse 95, temperature 98 F (36.7 C), resp. rate 18, height 5\' 7"  (1.702 m), weight 50 kg, SpO2 98 %.   Vitals  labs and radiology finding personally reviewed  Review of Systems:    Pertinent positives include:  , fatigue, chest pain,    Constitutional:  No weight loss, night sweats, Fevers, chills weight loss  HEENT:  No headaches, Difficulty swallowing,Tooth/dental problems,Sore throat,  No sneezing, itching, ear ache, nasal congestion, post nasal drip,  Cardio-vascular:  NoOrthopnea, PND, anasarca, dizziness, palpitations.no Bilateral lower extremity swelling  GI:  No heartburn, indigestion, abdominal pain, nausea, vomiting, diarrhea, change in bowel habits, loss of appetite, melena, blood in stool, hematemesis Resp:  no shortness of breath at rest. No dyspnea on exertion, No excess mucus, no productive cough, No non-productive cough, No coughing up of blood.No change in color of mucus.No wheezing. Skin:  no rash or lesions. No jaundice GU:  no dysuria, change in color of urine, no urgency or frequency. No straining to urinate.  No flank pain.  Musculoskeletal:  No joint pain or no joint swelling. No decreased range of motion. No back pain.  Psych:  No change in mood or affect. No  depression or anxiety. No memory loss.  Neuro: no localizing neurological complaints, no tingling, no weakness, no double vision, no gait abnormality, no slurred speech, no confusion  All systems reviewed and apart from HOPI all are negative _______________________________________________________________________________________________ Past Medical History:   Past Medical History:  Diagnosis Date   Abnormal Pap smear    Anxiety    Breast cancer (HCC)    Cancer (HCC) 1999   breast-s/p mastectomy, chemo, rad   CIN I (cervical intraepithelial neoplasia I) 2003   by colpo   Congenital deafness    Deaf    Depression    History of radiation therapy    Right femur, Left hip- 12/09/20-12/25/20- Dr. 12/27/20   History of radiation therapy    right pelvis/right femur 09/28/2017-10/11/2017  Dr 10/13/2017   Port-A-Cath in place 2017   for chemotherapy   Radiation 07/12/15-07/26/15   left femur 30 Gy   S/P bilateral oophorectomy       Past Surgical History:  Procedure Laterality Date   ABDOMINAL HYSTERECTOMY     INTRAMEDULLARY (IM) NAIL INTERTROCHANTERIC Left 06/26/2015   Procedure: LEFT BIOMET LONG AFFIXS NAIL;  Surgeon: 06/28/2015, MD;  Location: [facility] OR;  Service: Orthopedics;  Laterality: Left;   IR GENERIC HISTORICAL  08/06/2015   IR US GUIDE VASC ACCESS LEFT 08/06/2015 Aletta Edouard, MD [facility]   IR GENERIC HISTORICAL  08/06/2015   IR FLUORO GUIDE CV LINE LEFT 08/06/2015 Aletta Edouard, MD [facility]   IR GENERIC HISTORICAL  03/05/2016   IR CV LINE INJECTION 03/05/2016 Markus Daft, MD [facility]   LEFT HEART CATH AND CORONARY ANGIOGRAPHY N/A 12/13/2017   Procedure: LEFT HEART CATH AND CORONARY ANGIOGRAPHY;  Surgeon: Leonie Man, MD;  Location: [facility];  Service: Cardiovascular;  Laterality: N/A;   MASTECTOMY     right breast   MASTECTOMY     OVARIAN CYST REMOVAL     RADIOLOGY WITH ANESTHESIA Left 06/25/2015   Procedure: MRI OF LEFT HIP WITH OR WITHOUT  CONTRAST;  Surgeon: Medication Radiologist, MD;  Location: [facility];  Service: Radiology;  Laterality: Left;  DR. MCINTYRE/MRI   TUBAL LIGATION      Social History:  Ambulatory   bed bound     reports that she has never smoked. She has never used smokeless tobacco. She reports that she does not drink alcohol and does not use drugs.   Family History:   Family History  Problem Relation Age of Onset   Hypertension Mother    Seizures Mother    Alzheimer's disease Maternal Uncle    Pancreatitis Maternal Grandmother    Heart attack Maternal Grandfather    ______________________________________________________________________________________________ Allergies: Allergies  Allergen Reactions   Chlorhexidine Itching and Rash   Diphenhydramine Hcl Itching and Rash   Fentanyl Itching   Morphine Itching and Other (See Comments)    "Blue tablets" = Severe itching, but can tolerate via IV   Other Itching and Other (See Comments)    CERTAIN DRESSINGS CAUSE SEVERE ITCHING   Phenergan [Promethazine] Shortness Of Breath, Itching and Other (See Comments)    Skin redness, also- can tolerate Compazine   Promethazine Hcl Shortness Of Breath, Itching and Other (See Comments)    Skin redness, also- Pt tolerates PO Compazine     Prior to Admission medications   Medication Sig Start Date End Date Taking? Authorizing Provider  betamethasone valerate ointment (VALISONE) 0.1 % Apply 1 application. topically 2 (two) times daily. Patient taking differently: Apply 1 application. topically See admin instructions. Apply to affected areas 2 times a day 05/10/21   Pickenpack-Cousar, Carlena Sax, NP  hydrocortisone 1 % ointment Apply 1 application. topically 2 (two) times daily. Patient taking differently: Apply 1 application. topically See admin instructions. Apply to affected areas 2 times a day 05/09/21   Pickenpack-Cousar, Carlena Sax, NP  hydrOXYzine (ATARAX) 25 MG tablet Take 1 tablet (25 mg total) by mouth every  6 (six) hours as needed for itching. Patient not taking: Reported on 05/18/2021 05/09/21   Pickenpack-Cousar, Carlena Sax, NP  lidocaine (LIDODERM) 5 % Place 1 patch onto the skin daily. Remove & Discard patch within 12 hours or as directed by MD Patient not taking: Reported on 05/18/2021 02/04/21   Pickenpack-Cousar, Carlena Sax, NP  lidocaine-prilocaine (EMLA) cream Apply 1 application topically as needed. Apply to Porta-Cath 1-2 hours prior to access as directed. Patient taking differently: Apply 1 application  topically as needed (to Porta-Cath, 1-2 hours prior to access, as directed). 03/07/21   Truitt Merle, MD  LORazepam (ATIVAN) 0.5 MG tablet Take 1 tablet (0.5 mg total) by mouth every 8 (eight) hours as needed for anxiety (nausea). Patient taking differently: Take 0.5 mg  by mouth every 8 (eight) hours as needed for anxiety (or nausea). 04/18/21   Pickenpack-Cousar, Carlena Sax, NP  meloxicam (MOBIC) 15 MG tablet Take 1 tablet (15 mg total) by mouth daily. 05/20/21   Patrecia Pour, MD  oxyCODONE (OXY IR/ROXICODONE) 5 MG immediate release tablet Take 1-2 tablets (5-10 mg total) by mouth every 6 (six) hours as needed for severe pain or moderate pain. 05/20/21   Patrecia Pour, MD  potassium chloride (KLOR-CON) 10 MEQ tablet Take 1 tablet (10 mEq total) by mouth 3 (three) times daily. 05/17/20   Truitt Merle, MD  prochlorperazine (COMPAZINE) 10 MG tablet TAKE 1 TABLET(10 MG) BY MOUTH EVERY 6 HOURS AS NEEDED FOR NAUSEA OR VOMITING Patient taking differently: Take 10 mg by mouth every 6 (six) hours as needed for vomiting or nausea. 09/23/20   Truitt Merle, MD  senna-docusate (SENOKOT-S) 8.6-50 MG tablet Take 2 tablets by mouth 2 (two) times daily. 05/20/21   Patrecia Pour, MD    ___________________________________________________________________________________________________ Physical Exam:    08/17/2021    5:45 PM 08/17/2021    5:30 PM 08/17/2021    5:15 PM  Vitals with BMI  Systolic 85 86 86  Diastolic 41 45 44  Pulse 95 92       1. General:  in No  Acute distress   Chronically ill  cachectic  -appearing 2. Psychological: Alert and  Oriented 3. Head/ENT:   Dry Mucous Membranes                          Head Non traumatic, neck supple                        Poor Dentition 4. SKIN:  decreased Skin turgor,  Skin clean Dry and intact no rash 5. Heart: Regular rate and rhythm no  Murmur, no Rub or gallop 6. Lungs: , no wheezes or crackles   7. Abdomen: Soft,  non-tender, Non distended bowel sounds present 8. Lower extremities: no clubbing, cyanosis, trace edema 9. Neurologically Grossly intact, moving all 4 extremities equally   10. MSK: Normal range of motion    Chart has been reviewed  ______________________________________________________________________________________________  Assessment/Plan 56 y.o. female with medical history significant of breast cancer metastatic to the bone  Admitted for pain related to cancer in the setting of breast cancer   Present on Admission:  Cancer related pain  Congenital deafness  [name], right (HCC)  Symptomatic anemia  Hypokalemia  Hypotension     Cancer related pain Restart methadone and oxycodone Avoid IV narcotics in the setting of hypotension  Palliative care consult email oncology that pt has been admitted  Congenital deafness Chronic stable  [name], right [facility]) Palliative care consult Patient has been seen in April by Dr. Burr Medico will let them know if she has been admitted. Pain management as needed Pt at this time says she wants to be FULL CODE  family states she was never supposed to be DNR int he first place  Seizure [facility]) Family states she have had seizures last was in August Is on phenytoin and decadrone for this Order a phenytoin level Check CT head no imaging here to see if there was any brain mets Order seizure precautions May need MRI of the brain w/wo contrast once able  to cooperate in AM    Symptomatic anemia Obtain anemia panel we  will transfuse 2 units discussed at length with family and patient who at this point states that she does not want to have blood transfusions Hemoglobin was unable to be read because it was reported less than 3 Patient denies any GI bleeding no melena no blood per rectum Obtain anemia panel Follow CBC  Hypokalemia We will replace and repeat in a.m.  Hypotension Suspect likely chronic as patient does appear to be functionally deteriorating. Restart Decadron Administer blood transfusion this should help with blood pressure. Suspect albumin is even lower may need an albumin infusion that would support her blood pressure as well Overall very poor prognosis Would benefit from further discussion palliative care consult PCCM is aware will see if pt decompensates   Other plan as per orders.  DVT prophylaxis:  SCD      Code Status: Full code as per patient and family I had personally discussed CODE STATUS with patient and family  I had spent discussing goals of care and CODE STATUS   Family Communication:   Family  at  Bedside discussed with husband  plan of care was discussed on the phone with   Daughter,    Disposition Plan:     likely will need placement for   hospice                          Following barriers for discharge:                                                      Pain controlled with PO medications                                                        Will need consultants to evaluate patient prior to discharge                       Palliative care    consulted                                    Consults called: PCCM aware  Admission status:  ED Disposition     ED Disposition  Admit   Condition  --   Comment  [facility] [[id number]]  Level of Care: ICU [6]  May place patient in observation at [facility] or [facility] if equivalent level of care is  available:: No  Covid Evaluation: Asymptomatic - no recent exposure (last 10 days) testing not required  Diagnosis: End of life care [343494]  Admitting Physician: Therisa Doyne [[id number]]  Attending Physician: Therisa Doyne [[id number]]           Obs     Level of care    stepdown/ICU  tele indefinitely please discontinue once patient no longer qualifies COVID-19 Labs    Aaniya Sterba 08/17/2021, 9:40 PM    [facility]     after 2 AM please page floor coverage PA If 7AM-7PM, please contact the day team taking care of the patient using [email or web address]  Patient was evaluated in the context of the global COVID-19 pandemic, which necessitated consideration that the patient might be at risk for infection with the SARS-CoV-2 virus that causes COVID-19. Institutional protocols and algorithms that pertain to the evaluation of patients at risk for COVID-19 are in a state of rapid change based on information released by regulatory bodies including the CDC and federal and state organizations. These policies and algorithms were followed during the patient's care.

## 2021-08-17 NOTE — ED Notes (Signed)
Repeat CBC sent to lab

## 2021-08-18 ENCOUNTER — Encounter (HOSPITAL_COMMUNITY): Payer: Self-pay | Admitting: Internal Medicine

## 2021-08-18 ENCOUNTER — Other Ambulatory Visit: Payer: Self-pay

## 2021-08-18 DIAGNOSIS — C50919 Malignant neoplasm of unspecified site of unspecified female breast: Secondary | ICD-10-CM | POA: Diagnosis not present

## 2021-08-18 DIAGNOSIS — C7951 Secondary malignant neoplasm of bone: Secondary | ICD-10-CM | POA: Diagnosis not present

## 2021-08-18 DIAGNOSIS — D61818 Other pancytopenia: Secondary | ICD-10-CM | POA: Diagnosis not present

## 2021-08-18 DIAGNOSIS — Z90710 Acquired absence of both cervix and uterus: Secondary | ICD-10-CM | POA: Diagnosis not present

## 2021-08-18 DIAGNOSIS — Z888 Allergy status to other drugs, medicaments and biological substances status: Secondary | ICD-10-CM | POA: Diagnosis not present

## 2021-08-18 DIAGNOSIS — T451X5D Adverse effect of antineoplastic and immunosuppressive drugs, subsequent encounter: Secondary | ICD-10-CM | POA: Diagnosis not present

## 2021-08-18 DIAGNOSIS — Z170 Estrogen receptor positive status [ER+]: Secondary | ICD-10-CM | POA: Diagnosis not present

## 2021-08-18 DIAGNOSIS — I959 Hypotension, unspecified: Secondary | ICD-10-CM | POA: Diagnosis not present

## 2021-08-18 DIAGNOSIS — I2510 Atherosclerotic heart disease of native coronary artery without angina pectoris: Secondary | ICD-10-CM | POA: Diagnosis present

## 2021-08-18 DIAGNOSIS — E871 Hypo-osmolality and hyponatremia: Secondary | ICD-10-CM | POA: Diagnosis not present

## 2021-08-18 DIAGNOSIS — C50911 Malignant neoplasm of unspecified site of right female breast: Secondary | ICD-10-CM | POA: Diagnosis not present

## 2021-08-18 DIAGNOSIS — Z923 Personal history of irradiation: Secondary | ICD-10-CM | POA: Diagnosis not present

## 2021-08-18 DIAGNOSIS — Z7189 Other specified counseling: Secondary | ICD-10-CM | POA: Diagnosis not present

## 2021-08-18 DIAGNOSIS — H905 Unspecified sensorineural hearing loss: Secondary | ICD-10-CM | POA: Diagnosis not present

## 2021-08-18 DIAGNOSIS — Z66 Do not resuscitate: Secondary | ICD-10-CM | POA: Diagnosis not present

## 2021-08-18 DIAGNOSIS — Z791 Long term (current) use of non-steroidal anti-inflammatories (NSAID): Secondary | ICD-10-CM | POA: Diagnosis not present

## 2021-08-18 DIAGNOSIS — G893 Neoplasm related pain (acute) (chronic): Secondary | ICD-10-CM | POA: Diagnosis not present

## 2021-08-18 DIAGNOSIS — R42 Dizziness and giddiness: Secondary | ICD-10-CM | POA: Diagnosis not present

## 2021-08-18 DIAGNOSIS — R569 Unspecified convulsions: Secondary | ICD-10-CM | POA: Diagnosis not present

## 2021-08-18 DIAGNOSIS — D61810 Antineoplastic chemotherapy induced pancytopenia: Secondary | ICD-10-CM | POA: Diagnosis not present

## 2021-08-18 DIAGNOSIS — Z515 Encounter for palliative care: Secondary | ICD-10-CM | POA: Diagnosis not present

## 2021-08-18 DIAGNOSIS — Z820 Family history of epilepsy and other diseases of the nervous system: Secondary | ICD-10-CM | POA: Diagnosis not present

## 2021-08-18 DIAGNOSIS — Z8249 Family history of ischemic heart disease and other diseases of the circulatory system: Secondary | ICD-10-CM | POA: Diagnosis not present

## 2021-08-18 DIAGNOSIS — G620 Drug-induced polyneuropathy: Secondary | ICD-10-CM | POA: Diagnosis not present

## 2021-08-18 DIAGNOSIS — D649 Anemia, unspecified: Secondary | ICD-10-CM | POA: Diagnosis not present

## 2021-08-18 DIAGNOSIS — Z7401 Bed confinement status: Secondary | ICD-10-CM | POA: Diagnosis not present

## 2021-08-18 DIAGNOSIS — Z79899 Other long term (current) drug therapy: Secondary | ICD-10-CM | POA: Diagnosis not present

## 2021-08-18 DIAGNOSIS — R079 Chest pain, unspecified: Secondary | ICD-10-CM | POA: Diagnosis not present

## 2021-08-18 DIAGNOSIS — Z885 Allergy status to narcotic agent status: Secondary | ICD-10-CM | POA: Diagnosis not present

## 2021-08-18 DIAGNOSIS — D539 Nutritional anemia, unspecified: Secondary | ICD-10-CM | POA: Diagnosis not present

## 2021-08-18 DIAGNOSIS — Z853 Personal history of malignant neoplasm of breast: Secondary | ICD-10-CM | POA: Diagnosis not present

## 2021-08-18 DIAGNOSIS — K219 Gastro-esophageal reflux disease without esophagitis: Secondary | ICD-10-CM | POA: Diagnosis not present

## 2021-08-18 DIAGNOSIS — D630 Anemia in neoplastic disease: Secondary | ICD-10-CM | POA: Diagnosis not present

## 2021-08-18 DIAGNOSIS — R9431 Abnormal electrocardiogram [ECG] [EKG]: Secondary | ICD-10-CM | POA: Diagnosis not present

## 2021-08-18 DIAGNOSIS — E876 Hypokalemia: Secondary | ICD-10-CM | POA: Diagnosis not present

## 2021-08-18 LAB — MRSA NEXT GEN BY PCR, NASAL: MRSA by PCR Next Gen: NOT DETECTED

## 2021-08-18 LAB — COMPREHENSIVE METABOLIC PANEL WITH GFR
ALT: 23 U/L (ref 0–44)
AST: 26 U/L (ref 15–41)
Albumin: 2.5 g/dL — ABNORMAL LOW (ref 3.5–5.0)
Alkaline Phosphatase: 85 U/L (ref 38–126)
Anion gap: 8 (ref 5–15)
BUN: 27 mg/dL — ABNORMAL HIGH (ref 6–20)
CO2: 19 mmol/L — ABNORMAL LOW (ref 22–32)
Calcium: 7.5 mg/dL — ABNORMAL LOW (ref 8.9–10.3)
Chloride: 106 mmol/L (ref 98–111)
Creatinine, Ser: 0.81 mg/dL (ref 0.44–1.00)
GFR, Estimated: >60 mL/min
Glucose, Bld: 96 mg/dL (ref 70–99)
Potassium: 3.1 mmol/L — ABNORMAL LOW (ref 3.5–5.1)
Sodium: 133 mmol/L — ABNORMAL LOW (ref 135–145)
Total Bilirubin: 1.2 mg/dL (ref 0.3–1.2)
Total Protein: 4.8 g/dL — ABNORMAL LOW (ref 6.5–8.1)

## 2021-08-18 LAB — PROTIME-INR
INR: 1.1 (ref 0.8–1.2)
Prothrombin Time: 13.8 s (ref 11.4–15.2)

## 2021-08-18 LAB — TSH: TSH: 7.32 u[IU]/mL — ABNORMAL HIGH (ref 0.350–4.500)

## 2021-08-18 LAB — PREALBUMIN: Prealbumin: 11 mg/dL — ABNORMAL LOW (ref 18–38)

## 2021-08-18 LAB — MAGNESIUM: Magnesium: 1.9 mg/dL (ref 1.7–2.4)

## 2021-08-18 LAB — PHOSPHORUS: Phosphorus: 3.5 mg/dL (ref 2.5–4.6)

## 2021-08-18 MED ORDER — PANTOPRAZOLE SODIUM 40 MG PO TBEC
40.0000 mg | DELAYED_RELEASE_TABLET | Freq: Every day | ORAL | Status: DC
  Administered 2021-08-19 – 2021-08-21 (×3): 40 mg via ORAL
  Filled 2021-08-18 (×4): qty 1

## 2021-08-18 MED ORDER — LORAZEPAM 0.5 MG PO TABS
0.5000 mg | ORAL_TABLET | Freq: Three times a day (TID) | ORAL | Status: DC | PRN
Start: 2021-08-18 — End: 2021-08-21
  Administered 2021-08-20: 0.5 mg via ORAL
  Filled 2021-08-18: qty 1

## 2021-08-18 MED ORDER — SODIUM CHLORIDE 0.9 % IV SOLN: INTRAVENOUS | Status: DC

## 2021-08-18 MED ORDER — ACETAMINOPHEN 325 MG PO TABS: 650.0000 mg | ORAL_TABLET | Freq: Four times a day (QID) | ORAL | Status: DC | PRN

## 2021-08-18 MED ORDER — METHADONE HCL 5 MG PO TABS
5.0000 mg | ORAL_TABLET | Freq: Two times a day (BID) | ORAL | Status: DC
  Administered 2021-08-18 – 2021-08-21 (×6): 5 mg via ORAL
  Filled 2021-08-18 (×6): qty 1

## 2021-08-18 MED ORDER — HYDROMORPHONE HCL 1 MG/ML IJ SOLN
0.5000 mg | INTRAMUSCULAR | Status: DC | PRN
  Administered 2021-08-18: 0.5 mg via INTRAVENOUS
  Filled 2021-08-18: qty 1

## 2021-08-18 MED ORDER — DIAZEPAM 5 MG/ML IJ SOLN
2.5000 mg | INTRAMUSCULAR | Status: DC | PRN
Start: 2021-08-18 — End: 2021-08-21
  Filled 2021-08-18: qty 2

## 2021-08-18 MED ORDER — OXYCODONE HCL 5 MG PO TABS
5.0000 mg | ORAL_TABLET | Freq: Four times a day (QID) | ORAL | Status: DC | PRN
  Administered 2021-08-18: 5 mg via ORAL
  Filled 2021-08-18: qty 1

## 2021-08-18 MED ORDER — MIRTAZAPINE 15 MG PO TABS
7.5000 mg | ORAL_TABLET | Freq: Every day | ORAL | Status: DC
  Administered 2021-08-18 – 2021-08-20 (×3): 7.5 mg via ORAL
  Filled 2021-08-18 (×3): qty 1

## 2021-08-18 MED ORDER — PHENYTOIN SODIUM EXTENDED 100 MG PO CAPS
100.0000 mg | ORAL_CAPSULE | Freq: Two times a day (BID) | ORAL | Status: DC
  Administered 2021-08-18 – 2021-08-21 (×6): 100 mg via ORAL
  Filled 2021-08-18 (×7): qty 1

## 2021-08-18 MED ORDER — METHADONE HCL 10 MG PO TABS
5.0000 mg | ORAL_TABLET | Freq: Every day | ORAL | Status: DC
  Filled 2021-08-18: qty 1

## 2021-08-18 MED ORDER — DEXAMETHASONE 4 MG PO TABS
4.0000 mg | ORAL_TABLET | Freq: Two times a day (BID) | ORAL | Status: DC
  Administered 2021-08-18 – 2021-08-21 (×6): 4 mg via ORAL
  Filled 2021-08-18: qty 1
  Filled 2021-08-18: qty 2
  Filled 2021-08-18 (×5): qty 1

## 2021-08-18 MED ORDER — ORAL CARE MOUTH RINSE: 15.0000 mL | OROMUCOSAL | Status: DC | PRN

## 2021-08-18 MED ORDER — SODIUM CHLORIDE 0.9 % IV SOLN: INTRAVENOUS | Status: AC

## 2021-08-18 MED ORDER — ACETAMINOPHEN 650 MG RE SUPP: 650.0000 mg | Freq: Four times a day (QID) | RECTAL | Status: DC | PRN

## 2021-08-18 NOTE — Progress Notes (Signed)
Nutrition Brief Note  Patient screened for MST with score of 3 and consult for assessment of nutrition requirements and status.   Chart reviewed. Patient utilizes Designer, television/film set. RD able to communicate with RN via secure chat. Patient now transitioning to comfort care and pending bed availability at residential hospice.   No further nutrition interventions planned at this time. Please re-consult as needed.      Jarome Matin, MS, RD, LDN, [place] Registered Dietitian II Inpatient Clinical Nutrition RD pager # and on-call/weekend pager # available in [facility]

## 2021-08-18 NOTE — TOC Initial Note (Signed)
Transition of Care [facility]) - Initial/Assessment Note    Patient Details  Name: [name] MRN: [id number] Date of Birth: 1965/06/15  Transition [facility]) CM/SW Contact:    Roseanne Kaufman, RN Phone Number: 08/18/2021, 2:47 PM  Clinical Narrative:  [facility] consulted for [place] hospice referral. Jeri Lager. has been following, this RNCM notified Shenita with [facility].                  TOC will continue to monitor.  Expected Discharge Plan: [place]     Patient Goals and CMS Choice Patient states their goals for this hospitalization and ongoing recovery are:: home      Expected Discharge Plan and Services Expected Discharge Plan: [place]   Discharge Planning Services: CM Consult                                          Prior Living Arrangements/Services   Lives with:: Spouse Patient language and need for interpreter reviewed:: Yes Do you feel safe going back to the place where you live?: Yes      Need for Family Participation in Patient Care: No (Comment) Care giver support system in place?: Yes (comment) Current home services: DME Criminal Activity/Legal Involvement Pertinent to Current Situation/Hospitalization: No - Comment as needed  Activities of Daily Living Home Assistive Devices/Equipment: Wheelchair, Environmental consultant (specify type) ADL Screening (condition at time of admission) Patient's cognitive ability adequate to safely complete daily activities?: Yes Is the patient deaf or have difficulty hearing?: Yes Does the patient have difficulty seeing, even when wearing glasses/contacts?: No Does the patient have difficulty concentrating, remembering, or making decisions?: No Patient able to express need for assistance with ADLs?: Yes Does the patient have difficulty dressing or bathing?: Yes Independently performs ADLs?: No Communication: Needs assistance (Interpreter) Is this a change from baseline?: Pre-admission  baseline Dressing (OT): Needs assistance Is this a change from baseline?: Pre-admission baseline Grooming: Independent Feeding: Independent Bathing: Needs assistance Is this a change from baseline?: Pre-admission baseline Toileting: Independent with device (comment) In/Out Bed: Independent with device (comment) Walks in Home: Independent with device (comment) Does the patient have difficulty walking or climbing stairs?: Yes Weakness of Legs: Both Weakness of Arms/Hands: Both  Permission Sought/Granted Permission sought to share information with : Case Manager Permission granted to share information with : Yes, Verbal Permission Granted  Share Information with NAME: Case manager           Emotional Assessment Appearance:: Appears stated age Attitude/Demeanor/Rapport: Gracious Affect (typically observed): Accepting Orientation: : Oriented to Self, Oriented to Place   Psych Involvement: No (comment)  Admission diagnosis:  End of life care [Z51.5] Palliative care encounter [Z51.5] Anemia, unspecified type [D64.9] Breast cancer ([place]) [C50.919] Patient Active Problem List   Diagnosis Date Noted   Breast cancer ([place]) 08/18/2021   End of life care 08/17/2021   Seizure ([place]) 08/17/2021   Symptomatic anemia 08/17/2021   Hypotension 08/17/2021   Cancer associated pain 05/18/2021   Intractable pain 05/17/2021   DNR (do not resuscitate) 07/19/2020   Chest pain 12/18/2017   Atypical angina ([place]) 12/13/2017   Abnormal stress echocardiogram 12/08/2017   Bilateral thigh pain 11/19/2016   Goals of care, counseling/discussion 03/05/2016   MRSA (methicillin resistant Staphylococcus aureus) infection 01/18/2016   Hypokalemia 01/18/2016   Anemia due to antineoplastic chemotherapy 12/11/2015   Lymphedema of  left leg 12/11/2015   Osteopenia of multiple sites 11/26/2015   Chemotherapy-induced peripheral neuropathy ([place]) 11/03/2015   Port catheter in place 09/26/2015   Chemotherapy  induced nausea and vomiting 08/13/2015   Cancer related pain 08/13/2015   [name], right ([place]) 07/14/2015   S/P TRAM (transverse rectus abdominis muscle) flap breast reconstruction 07/14/2015   Thyroid nodule 07/09/2015   Depression    Generalized anxiety disorder    Esophageal reflux    Genetic testing 04/04/2015   Congenital deafness 02/26/2015   Premature surgical menopause 02/26/2015   Benign paroxysmal positional vertigo 07/24/2014   Migraine 07/24/2014   DIZZINESS, CHRONIC 01/25/2007   Insomnia 01/25/2007   PCP:  [name], [name] Pharmacy:   [facility] [place], [address] [place] [address] Phone: [phone] Fax: [phone]  [facility] [address] [place] [address] Phone: [phone] Fax: [phone]     Social Determinants of Health (SDOH) Interventions    Readmission Risk Interventions     No data to display

## 2021-08-18 NOTE — Progress Notes (Addendum)
[facility])  Ms. Kantor was referred to [facility] by [facility] for IPU beacon place at discharge. Patient has been approved by hospice MD.   Unfortunately we do not have any beds available today. [place] team will be back in touch once a bed becomes available.  This liaison did speak with spouse and patient at bedside. Both need an sign language interpretor which is at bedside. The spouse is contact and can understand by interpretation on lips but will also need interpretor.   Please reach out with any hospice related questions.  Clementeen Hoof, [place], [facility] [phone]

## 2021-08-18 NOTE — Progress Notes (Signed)
Scherrie Gerlach, DNP, with palliative services inquired whether the patient was discharged or informed the hospice center in [place] that the patient is no longer going to receive services but will switch to services here locally.   The patient's daughter, Tillie Rung [phone]), was contacted with a verbal ok from the patient that this nurse may speak with her, and the hospice POC was given [place] Go [phone]).   Per the HR department with [facility] in [place], the patient's services were canceled as of 08/16/21.   Berneta Sages. and the patient were informed of this.

## 2021-08-18 NOTE — Progress Notes (Signed)
PT Cancellation Note  Patient Details Name: [name] MRN: [id number] DOB: 1965-09-15   Cancelled Treatment:    Reason Eval/Treat Not Completed: Pain limiting ability to participate [place] Office [phone] Weekend pager-[phone]    Claretha Cooper 08/18/2021, 9:30 AM

## 2021-08-18 NOTE — Progress Notes (Signed)
Patient is not interactive at this time. Refuses drink/medication PO/any PRN medication for pain/anxiety. Will continue to monitor patient and utilized interpreter to educate patient on calling out to the nursing staff for any medications to keep her comfortable at this time.

## 2021-08-18 NOTE — Consult Note (Signed)
Palliative Care  Consultation Note  Patient Narrative: 56 year old woman with widely metastatic breast cancer including liver, bones bronchus, spine and pelvis with bone marrow involvement. Chemotherapy was stopped in 04/2021 and recommendation was made by Dr. Burr Medico for hospice care.  At that time she saw our palliative care outpatient provider in the clinic in the cancer center and was referred to hospice.  Shortly thereafter the patient decided to move to [place] to be with her daughter and granddaughter-his care was transition to a hospice in [place] who was supposed to provide in-home care-apparently this never occurred and she got there without the support that she needed including pain control and symptom management.  Details of this transition are still unclear, she has returned to [facility] after her husband went to [place] to get her of concern for her wellbeing.   She was admitted to the hospital for uncontrolled pain, severe anemia and dehydration.  Upon arrival to the hospital there was an understanding that she should be comfort care however this changed during her ED stay and per H&P patient and family stating wanted full scope interventions.  I met with both patient and husband at bedside today with a sign language interpreter present.  I explained to them the terminal trajectory of her illness and that her body was not able to make enough blood cells and other cells like platelets that help her blood clot.  She received several transfusions and she arrived in the ER but these are only temporizing measures and will not reverse her underlying process which is terminal.  Patient and husband had acute grief response to receiving this news-it is unclear to me if they had this information prior to their previous enrollment in hospice.  Husband tells me he was not involved in these discussions and that the patient did not communicate them to him.  The patient herself did not seem to have a clear  understanding that her condition was in fact in the terminal end stages she asked me how long that she had and given her current state estimated anywhere from days to just a couple of weeks.  They understand that this is now in a different place and that the best medical treatment that we can provide would be comfort measures and to make sure that she does not suffer for the time that she has left.  Summary of Recommendations: DNR, I clarified this CODE STATUS with specific details and asked them to repeat back to me what they understood via the signing interpreter. Resumed methadone at 5 mg twice daily for pain with oxycodone for breakthrough pain and IV hydromorphone as needed I discussed the concept of hospice care even though she has previously been admitted on hospice services there was some confusion about what this meant-in her current condition she would be most appropriate for a hospice IPU for her symptom management. I believe that she will have an acute decline with the absence of transfusions-her bone marrow appears to be completely failed and there is no value to continue transfusions in the setting.  If she were to stabilize out in the hospice IPU she could be discharged home she is now with her husband. I provided grief support and answered all of their questions I have asked the transitions of care team to begin the hospice referral process. Palliative orders have been placed for symptom management.   Lane Hacker, DO Palliative Medicine

## 2021-08-18 NOTE — Progress Notes (Signed)
OT Cancellation Note  Patient Details Name: JAQUAYLA HEGE MRN: [id number] DOB: 08/14/1965   Cancelled Treatment:    Reason Eval/Treat Not Completed: Pain limiting ability to participate OT to continue to follow and check back as schedule will allow.  Jackelyn Poling OTR/L, [place] [facility] Department Office# [phone] Pager# [phone]  08/18/2021, 9:49 AM

## 2021-08-18 NOTE — Progress Notes (Signed)
[facility] tri[facility] Hospitalist  PROGRESS NOTE  [name] HWE:[id number] DOB: 10-22-1965 DOA: 08/17/2021 PCP: [name], [name]   Brief HPI:    56 year old female with medical history of breast cancer with metastasis to bone who had moved out of [place] to [place] recently moved back to [place] to live with her husband.  She came to ED due to worsening pain, she has breast cancer with mets to bone.  Patient was already enrolled in hospice.  Patient told attending provider that she does not want hospice and wants to be full code.  Palliative care was consulted for clarification of goals of care.   Subjective   Patient seen and examined, communicated with the help of sign language interpreter as patient and her husband are both speech and hearing impaired.   Assessment/Plan:    Metastatic breast cancer with mets to bone -Patient has history of metastatic right breast cancer to bone, ER positive, H ER 2 positive -She was followed by oncology Dr. Annamaria Boots -Patient received multiple endocrine therapy and chemotherapy however continue to get worse -In January of this year, oncology recommended to switch her to home hospice, as her life expectancy likely few months as per Dr. Annamaria Boots -Patient was enrolled in hospice however both patient and husband are not very clear about role of hospice -Perative care has been consulted for further clarification of goals of care  Pancytopenia -Likely from underlying malignancy as above -Hemoglobin is 6.6 today -Will await palliative care discussion before blood transfusion   Goals of care -Palliative care consulted -Patient has poor prognosis   Medications     sodium chloride   Intravenous Once   dexamethasone  4 mg Oral BID   methadone  5 mg Oral Q12H   mirtazapine  7.5 mg Oral QHS   pantoprazole  40 mg Oral Daily   phenytoin  100 mg Oral BID   sodium chloride flush  10-40 mL Intracatheter Q12H     Data Reviewed:    CBG:  No results for input(s): "GLUCAP" in the last 168 hours.  SpO2: 100 % O2 Flow Rate (L/min): 3 L/min    Vitals:   08/18/21 1000 08/18/21 1100 08/18/21 1200 08/18/21 1201  BP: (!) 143/53 (!) 155/58    Pulse: 88 98  91  Resp: 17 20  (!) 21  Temp:   99.3 F (37.4 C)   TempSrc:   Oral   SpO2: 100% 100%  100%  Weight:      Height:          Data Reviewed:  Basic Metabolic Panel: Recent Labs  Lab 08/17/21 1410 08/17/21 1421 08/17/21 2150 08/18/21 0845  NA 131*  --   --  133*  K 3.3*  --   --  3.1*  CL 100  --   --  106  CO2 20*  --   --  19*  GLUCOSE 85  --   --  96  BUN 39*  --   --  27*  CREATININE 1.08*  --   --  0.81  CALCIUM 8.2*  --   --  7.5*  MG  --  2.3  --  1.9  PHOS  --   --  4.8* 3.5    CBC: Recent Labs  Lab 08/18/21 0845  WBC 1.9*  HGB 6.6*  HCT 19.1*  MCV 99.0  PLT 11*    LFT Recent Labs  Lab 08/17/21 1410 08/18/21 0845  AST 28 26  ALT 27 23  ALKPHOS 90 85  BILITOT 1.1 1.2  PROT 5.4* 4.8*  ALBUMIN 2.8* 2.5*     Antibiotics: Anti-infectives (From admission, onward)    None        DVT prophylaxis: SCDs  Code Status: DNR  Family Communication: Discussed with patient's husband at bedside   CONSULTS    Objective    Physical Examination:   General: Appears in no acute distress Cardiovascular: S1-S2, regular, no murmur auscultated Respiratory: Clear to auscultation bilaterally Abdomen: Abdomen is soft, nontender, no organomegaly Extremities: No edema in the lower extremities Neurologic: Alert, oriented x 3, intact insight and judgment, moving all extremities   Status is: Inpatient: Metastatic breast cancer with mets to bone, generalized pain           [place]   [facility] If 7PM-7AM, please contact night-coverage at [email or web address], Office  [phone]   08/18/2021, 1:16 PM  LOS: 0 days

## 2021-08-19 DIAGNOSIS — H905 Unspecified sensorineural hearing loss: Secondary | ICD-10-CM | POA: Diagnosis not present

## 2021-08-19 DIAGNOSIS — C50911 Malignant neoplasm of unspecified site of right female breast: Secondary | ICD-10-CM | POA: Diagnosis not present

## 2021-08-19 DIAGNOSIS — D649 Anemia, unspecified: Secondary | ICD-10-CM | POA: Diagnosis not present

## 2021-08-19 DIAGNOSIS — Z7189 Other specified counseling: Secondary | ICD-10-CM | POA: Diagnosis not present

## 2021-08-19 DIAGNOSIS — I959 Hypotension, unspecified: Secondary | ICD-10-CM | POA: Diagnosis not present

## 2021-08-19 DIAGNOSIS — Z515 Encounter for palliative care: Secondary | ICD-10-CM | POA: Diagnosis not present

## 2021-08-19 DIAGNOSIS — G893 Neoplasm related pain (acute) (chronic): Secondary | ICD-10-CM | POA: Diagnosis not present

## 2021-08-19 LAB — BPAM RBC
Blood Product Expiration Date: 202309052359
Blood Product Expiration Date: 202309062359
ISSUE DATE / TIME: 202308062254
ISSUE DATE / TIME: 202308070320
Unit Type and Rh: 5100
Unit Type and Rh: 5100

## 2021-08-19 LAB — TYPE AND SCREEN
ABO/RH(D): O POS
Antibody Screen: NEGATIVE
Unit division: 0
Unit division: 0

## 2021-08-19 NOTE — TOC Progression Note (Signed)
Transition of Care [facility]) - Progression Note    Patient Details  Name: [name] MRN: [id number] Date of Birth: 09-Dec-1965  Transition of Care [facility]) CM/SW Contact  Ross Ludwig, [place] Phone Number: 08/19/2021, 10:18 AM  Clinical Narrative:     CSW received consult that patient was waiting for a bed at [facility].  CSW was informed that there is not a bed available for today.  CSW to continue to follow patient's progress throughout discharge planning.   Expected Discharge Plan: [place]    Expected Discharge Plan and Services Expected Discharge Plan: [place]   Discharge Planning Services: CM Consult                                           Social Determinants of Health (SDOH) Interventions    Readmission Risk Interventions     No data to display

## 2021-08-19 NOTE — Progress Notes (Signed)
Daily Progress Note   Patient Name: [name]       Date: 08/19/2021 DOB: [birth year]  Age: 56 y.o. MRN#: [id number] Attending Physician: Oswald Hillock, MD Primary Care Physician: [name], [place] Admit Date: 08/17/2021  Reason for Consultation/Follow-up: Hospice Evaluation and Terminal Care  Subjective: Awake alert, in no distress. Family at bedside.   Length of Stay: 1  Current Medications: Scheduled Meds:  . sodium chloride   Intravenous Once  . dexamethasone  4 mg Oral BID  . methadone  5 mg Oral Q12H  . mirtazapine  7.5 mg Oral QHS  . pantoprazole  40 mg Oral Daily  . phenytoin  100 mg Oral BID  . sodium chloride flush  10-40 mL Intracatheter Q12H    Continuous Infusions:   PRN Meds: acetaminophen **OR** acetaminophen, diazepam, HYDROmorphone (DILAUDID) injection, LORazepam, mouth rinse, sodium chloride flush  Physical Exam         Awake alert  Sitting up in  bed No distress Appears weak  Vital Signs: BP 109/60 (BP Location: Left Arm)   Pulse 69   Temp 97.7 F (36.5 C) (Oral)   Resp 20   Ht '5\' 7"'$  (1.702 m)   Wt 50 kg   SpO2 100%   BMI 17.26 kg/m  SpO2: SpO2: 100 % O2 Device: O2 Device: Nasal Cannula O2 Flow Rate: O2 Flow Rate (L/min): 3 L/min  Intake/output summary:  Intake/Output Summary (Last 24 hours) at 08/19/2021 1422 Last data filed at 08/19/2021 0700 Gross per 24 hour  Intake --  Output 700 ml  Net -700 ml   LBM: Last BM Date :  (PTA) Baseline Weight: Weight: 50 kg Most recent weight: Weight: 50 kg       Palliative Assessment/Data:      Patient Active Problem List   Diagnosis Date Noted  . Breast cancer ([place]) 08/18/2021  . End of life care 08/17/2021  . Seizure ([place]) 08/17/2021  . Symptomatic anemia 08/17/2021  .  Hypotension 08/17/2021  . Cancer associated pain 05/18/2021  . Intractable pain 05/17/2021  . DNR (do not resuscitate) 07/19/2020  . Chest pain 12/18/2017  . Atypical angina ([place]) 12/13/2017  . Abnormal stress echocardiogram 12/08/2017  . Bilateral thigh pain 11/19/2016  . Goals of care, counseling/discussion 03/05/2016  . MRSA (methicillin resistant Staphylococcus aureus)  infection 01/18/2016  . Hypokalemia 01/18/2016  . Anemia due to antineoplastic chemotherapy 12/11/2015  . Lymphedema of left leg 12/11/2015  . Osteopenia of multiple sites 11/26/2015  . Chemotherapy-induced peripheral neuropathy ([place]) 11/03/2015  . Port catheter in place 09/26/2015  . Chemotherapy induced nausea and vomiting 08/13/2015  . Cancer related pain 08/13/2015  . [name], right ([place]) 07/14/2015  . S/P TRAM (transverse rectus abdominis muscle) flap breast reconstruction 07/14/2015  . Thyroid nodule 07/09/2015  . Depression   . Generalized anxiety disorder   . Esophageal reflux   . Genetic testing 04/04/2015  . Congenital deafness 02/26/2015  . Premature surgical menopause 02/26/2015  . Benign paroxysmal positional vertigo 07/24/2014  . Migraine 07/24/2014  . DIZZINESS, CHRONIC 01/25/2007  . Insomnia 01/25/2007    Palliative Care Assessment & Plan   Patient Profile:    Assessment: Metastatic breast cancer   Recommendations/Plan: Continue current mode of care Awaiting residential hospice    Code Status:    Code Status Orders  (From admission, onward)           Start     Ordered   08/18/21 1058  Do not attempt resuscitation (DNR)  Continuous       Question Answer Comment  In the event of cardiac or respiratory ARREST Do not call a "code blue"   In the event of cardiac or respiratory ARREST Do not perform Intubation, CPR, defibrillation or ACLS   In the event of cardiac or respiratory ARREST Use medication by any route, position, wound care, and other  measures to relive pain and suffering. May use oxygen, suction and manual treatment of airway obstruction as needed for comfort.      08/18/21 1105           Code Status History     Date Active Date Inactive Code Status Order ID Comments User Context   08/18/2021 0056 08/18/2021 1105 Full Code [id number]  Toy Baker, MD Inpatient   05/17/2021 1535 05/20/2021 1927 DNR [id number]  Lequita Halt, MD ED   06/25/2015 1420 07/02/2015 1928 Full Code [id number]  Veatrice Bourbon, MD Inpatient       Prognosis:  < 2 weeks  Discharge Planning: Hospice facility  Care plan was discussed with patient and family at bedside along with nursing colleague.   Thank you for allowing the Palliative Medicine Team to assist in the care of this patient. MOD MDM.      Greater than 50%  of this time was spent counseling and coordinating care related to the above assessment and plan.  Loistine Chance, MD  Please contact Palliative Medicine Team phone at [phone] for questions and concerns.

## 2021-08-19 NOTE — Progress Notes (Signed)
Chaplains (Katy [place] and Rob Bunting) met with [name] to assess for spiritual needs as she is Comfort Care at this time.  [name] and her husband communicated (through a sign language interpreter on the iPad) that they did not have any needs at this time.  If needs arise, please page for continued support.  [address] [place] Pager, [phone]

## 2021-08-19 NOTE — Progress Notes (Signed)
[facility]  PROGRESS NOTE  [name] WFU:[id number] DOB: [birth year] DOA: 08/17/2021 PCP: [name], [name]   Brief HPI:    56 year old female with medical history of breast cancer with metastasis to bone who had moved out of [place] to [place] recently moved back to [place] to live with her husband.  She came to ED due to worsening pain, she has breast cancer with mets to bone.  Patient was already enrolled in hospice.  Patient told attending provider that she does not want hospice and wants to be full code.  Palliative care was consulted for clarification of goals of care.   Subjective   Patient seen and examined, no new complaints.  Awaiting to go to residential hospice   Assessment/Plan:    Metastatic breast cancer with mets to bone -Patient has history of metastatic right breast cancer to bone, ER positive, H ER 2 positive -She was followed by oncology Dr. Annamaria Boots -Patient received multiple endocrine therapy and chemotherapy however continue to get worse -In January of this year, oncology recommended to switch her to home hospice, as her life expectancy likely few months as per Dr. Annamaria Boots -Patient was enrolled in hospice however both patient and husband are not very clear about role of hospice -Perative care has been consulted for further clarification of goals of care  Pancytopenia -Likely from underlying malignancy as above -Hemoglobin is 6.6 today -Will await palliative care discussion before blood transfusion   Goals of care -Palliative care consulted -Patient has poor prognosis -Plan to go to residential hospice. -Awaiting bed at inpatient hospice   Medications     sodium chloride   Intravenous Once   dexamethasone  4 mg Oral BID   methadone  5 mg Oral Q12H   mirtazapine  7.5 mg Oral QHS   pantoprazole  40 mg Oral Daily   phenytoin  100 mg Oral BID   sodium chloride flush  10-40 mL Intracatheter Q12H     Data Reviewed:    CBG:  No results for input(s): "GLUCAP" in the last 168 hours.  SpO2: 100 % O2 Flow Rate (L/min): 3 L/min    Vitals:   08/18/21 1400 08/18/21 1500 08/18/21 1600 08/19/21 0547  BP:    109/60  Pulse: 82 96 85 69  Resp: 19 20 (!) 21 20  Temp:    97.7 F (36.5 C)  TempSrc:    Oral  SpO2: 100% 100% 100% 100%  Weight:      Height:          Data Reviewed:  Basic Metabolic Panel: Recent Labs  Lab 08/17/21 1410 08/17/21 1421 08/17/21 2150 08/18/21 0845  NA 131*  --   --  133*  K 3.3*  --   --  3.1*  CL 100  --   --  106  CO2 20*  --   --  19*  GLUCOSE 85  --   --  96  BUN 39*  --   --  27*  CREATININE 1.08*  --   --  0.81  CALCIUM 8.2*  --   --  7.5*  MG  --  2.3  --  1.9  PHOS  --   --  4.8* 3.5    CBC: Recent Labs  Lab 08/18/21 0845  WBC 1.9*  HGB 6.6*  HCT 19.1*  MCV 99.0  PLT 11*    LFT Recent Labs  Lab 08/17/21 1410 08/18/21 0845  AST 28 26  ALT 27 23  ALKPHOS 90 85  BILITOT 1.1 1.2  PROT 5.4* 4.8*  ALBUMIN 2.8* 2.5*     Antibiotics: Anti-infectives (From admission, onward)    None        DVT prophylaxis: SCDs  Code Status: DNR  Family Communication: Discussed with patient's husband at bedside   CONSULTS    Objective    Physical Examination:   General-appears in no acute distress Heart-S1-S2, regular, no murmur auscultated Lungs-clear to auscultation bilaterally, no wheezing or crackles auscultated Abdomen-soft, nontender, no organomegaly Extremities-no edema in the lower extremities Neuro-alert, oriented x3, no focal deficit noted   Status is: Inpatient: Metastatic breast cancer with mets to bone, generalized pain           [place]   [facility] If 7PM-7AM, please contact night-coverage at [email or web address], Office  [phone]   08/19/2021, 6:36 PM  LOS: 1 day

## 2021-08-19 NOTE — Progress Notes (Signed)
[facility] - Manufacturing engineer [facility]) Hospice liaison Note:   [facility], unfortunately we still do not have a bed available today. [facility] team will follow up tomorrow or sooner if bed becomes available.   TOC updated.   Please reach out with any hospice related questions.    Zigmund Gottron RN  [facility] liaison  [phone]

## 2021-08-20 DIAGNOSIS — C50919 Malignant neoplasm of unspecified site of unspecified female breast: Secondary | ICD-10-CM

## 2021-08-20 LAB — CBC
HCT: 19.1 % — ABNORMAL LOW (ref 36.0–46.0)
Hemoglobin: 6.6 g/dL — CL (ref 12.0–15.0)
MCH: 34.2 pg — ABNORMAL HIGH (ref 26.0–34.0)
MCHC: 34.6 g/dL (ref 30.0–36.0)
MCV: 99 fL (ref 80.0–100.0)
Platelets: 11 10*3/uL — CL (ref 150–400)
RBC: 1.93 MIL/uL — ABNORMAL LOW (ref 3.87–5.11)
RDW: 17.1 % — ABNORMAL HIGH (ref 11.5–15.5)
WBC: 1.9 10*3/uL — ABNORMAL LOW (ref 4.0–10.5)
nRBC: 3.7 % — ABNORMAL HIGH (ref 0.0–0.2)

## 2021-08-20 MED ORDER — POLYVINYL ALCOHOL 1.4 % OP SOLN
1.0000 [drp] | OPHTHALMIC | Status: DC | PRN
  Administered 2021-08-20: 1 [drp] via OPHTHALMIC
  Filled 2021-08-20: qty 15

## 2021-08-20 NOTE — Consult Note (Signed)
   [facility] CM Inpatient Consult   08/20/2021  [name] 17-Sep-1965 [id number]  Managed Medicaid:  High risk review for post hospital  *Remote review coverage for [facility]  Brief review reveals patient is for residential hospice and needs will be met for post hospital transition at this level of care.  For questions,  [name], RN BSN [facility] Liaison  [phone] business mobile phone Toll free office [phone]  Fax number: [phone] [name]

## 2021-08-20 NOTE — Discharge Summary (Signed)
Physician Discharge Summary  [name] [id number]:[id number] DOB: 1965/01/26 DOA: 08/17/2021  PCP: [name], [name]  Admit date: 08/17/2021 Discharge date: 08/20/2021  Admitted From: Home Disposition:  residential hospice  Discharge Condition:Stable CODE STATUS:DNR Diet recommendation:Regular  Brief/Interim Summary:  Patient is a 56 year old female with medical history of breast cancer with metastasis to bone who had moved out of [place] to [place] recently moved back to [place] to live with her husband.  She came to ED due to worsening pain, she has breast cancer with mets to bone.  Patient was already enrolled in hospice.  Extensive goals of care again discussed involving palliative care here.  Plan is to discharge her to residential hospice whenever possible.   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 Annamaria Boots -Patient received multiple endocrine therapy and chemotherapy however continue to get worse -In January of this year, oncology recommended to switch her to home hospice, as her life expectancy likely few months as per Dr. Annamaria Boots -Palliative care  consulted for further clarification of goals of care. Decision  made to discharge her to residential hospice   Pancytopenia -Likely from underlying malignancy as above  Goals of care -Palliative care consulted -Patient has poor prognosis -Plan is  to go to residential hospice.      Discharge Diagnoses:  Principal Problem:   Breast cancer [facility]) Active Problems:   Congenital deafness   [name], right (HCC)   Cancer related pain   Hypokalemia   Seizure (HCC)   Symptomatic anemia   Hypotension    Discharge Instructions  Discharge Instructions     Diet general   Complete by: As  directed    Increase activity slowly   Complete by: As directed       Allergies as of 08/20/2021       Reactions   Chlorhexidine Itching, Rash   Diphenhydramine Hcl Itching, Rash   Fentanyl Itching   Morphine Itching, Other (See Comments)   "Blue tablets" = Severe itching, but can tolerate via IV   Other Itching, Other (See Comments)   CERTAIN DRESSINGS CAUSE SEVERE ITCHING   Phenergan [promethazine] Shortness Of Breath, Itching, Other (See Comments)   Skin redness, also- can tolerate Compazine   Promethazine Hcl Shortness Of Breath, Itching, Other (See Comments)   Skin redness, also- Pt tolerates PO Compazine        Medication List     STOP taking these medications    betamethasone valerate ointment 0.1 % Commonly known as: VALISONE   dexamethasone 4 MG tablet Commonly known as: DECADRON   hydrOXYzine 25 MG tablet Commonly known as: ATARAX   lidocaine 5 % Commonly known as: LIDODERM   lidocaine-prilocaine cream Commonly known as: EMLA   LORazepam 0.5 MG tablet Commonly known as: ATIVAN   methadone 5 MG tablet Commonly known as: DOLOPHINE   mirtazapine 7.5 MG tablet Commonly known as: REMERON   omeprazole 20 MG capsule Commonly known as: PRILOSEC   ondansetron 4 MG tablet Commonly known as: ZOFRAN   oxyCODONE 5 MG immediate release tablet Commonly known as: Oxy IR/ROXICODONE   phenytoin 100 MG ER capsule Commonly known as: DILANTIN   Senexon-S 8.6-50 MG tablet Generic drug: senna-docusate   temazepam 30 MG capsule Commonly known as: RESTORIL  Allergies  Allergen Reactions   Chlorhexidine Itching and Rash   Diphenhydramine Hcl Itching and Rash   Fentanyl Itching   Morphine Itching and Other (See Comments)    "Blue tablets" = Severe itching, but can tolerate via IV   Other Itching and Other (See Comments)    CERTAIN DRESSINGS CAUSE SEVERE ITCHING   Phenergan [Promethazine] Shortness Of Breath, Itching and Other (See Comments)     Skin redness, also- can tolerate Compazine   Promethazine Hcl Shortness Of Breath, Itching and Other (See Comments)    Skin redness, also- Pt tolerates PO Compazine    Consultations: Palliative care   Procedures/Studies: DG Abd 1 View  Result Date: 08/17/2021 CLINICAL DATA:  Nausea and generalized pain. Breast cancer with bone metastasis. EXAM: ABDOMEN - 1 VIEW COMPARISON:  PET CT 02/04/2021 FINDINGS: No bowel dilatation or evidence of obstruction. Small to moderate volume of stool in the colon. Cholecystectomy clips in the right upper quadrant. Additional surgical clips in the right abdomen localized to the abdominal wall. No evidence of radiopaque calculi, multiple overlying monitoring devices partially obscures assessment. Known osseous metastatic disease which is not well evaluated on the current exam. IMPRESSION: Normal bowel gas pattern. Electronically Signed   By: Keith Rake M.D.   On: 08/17/2021 21:49   CT HEAD WO CONTRAST (5MM)  Result Date: 08/17/2021 CLINICAL DATA:  New onset seizure. EXAM: CT HEAD WITHOUT CONTRAST TECHNIQUE: Contiguous axial images were obtained from the base of the skull through the vertex without intravenous contrast. RADIATION DOSE REDUCTION: This exam was performed according to the departmental dose-optimization program which includes automated exposure control, adjustment of the mA and/or kV according to patient size and/or use of iterative reconstruction technique. COMPARISON:  June 23, 2019 FINDINGS: Brain: No evidence of acute infarction, hemorrhage, hydrocephalus, extra-axial collection or mass lesion/mass effect. Vascular: No hyperdense vessel or unexpected calcification. Skull: Normal. Negative for fracture or focal lesion. Sinuses/Orbits: Minimal opacification of the left sphenoid sinus. Other: None. IMPRESSION: 1. No acute intracranial abnormality. 2. Minimal opacification of the left sphenoid sinus. Electronically Signed   By: Fidela Salisbury M.D.    On: 08/17/2021 21:48   DG Chest Portable 1 View  Result Date: 08/17/2021 CLINICAL DATA:  chest pain EXAM: PORTABLE CHEST 1 VIEW COMPARISON:  March 17, 2021 FINDINGS: The cardiomediastinal silhouette is unchanged in contour.LEFT chest port with tip terminating over the superior cavoatrial junction. No pleural effusion. No pneumothorax. No acute pleuroparenchymal abnormality. Visualized abdomen is unremarkable. RIGHT-sided surgical clips. Known osseous metastatic disease is not well visualized radiographically. IMPRESSION: No acute cardiopulmonary abnormality. Electronically Signed   By: Valentino Saxon M.D.   On: 08/17/2021 15:05      Subjective: Patient seen and examined at the bedside this morning.  She was appearing comfortable, sitting on the bed, without any distress.  We discussed about discharge planning to residential hospice which she agrees on  Discharge Exam: Vitals:   08/18/21 1600 08/19/21 0547  BP:  109/60  Pulse: 85 69  Resp: (!) 21 20  Temp:  97.7 F (36.5 C)  SpO2: 100% 100%   Vitals:   08/18/21 1400 08/18/21 1500 08/18/21 1600 08/19/21 0547  BP:    109/60  Pulse: 82 96 85 69  Resp: 19 20 (!) 21 20  Temp:    97.7 F (36.5 C)  TempSrc:    Oral  SpO2: 100% 100% 100% 100%  Weight:      Height:        General:  Pt is alert, awake, not in acute distress,cachetic Cardiovascular: RRR, S1/S2 +, no rubs, no gallops,port a cath on right chest Respiratory: CTA bilaterally, no wheezing, no rhonchi Abdominal: Soft, NT, ND, bowel sounds + Extremities: no edema, no cyanosis    The results of significant diagnostics from this hospitalization (including imaging, microbiology, ancillary and laboratory) are listed below for reference.     Microbiology: Recent Results (from the past 240 hour(s))  MRSA Next Gen by PCR, Nasal     Status: None   Collection Time: 08/18/21  5:17 AM   Specimen: Nasal Mucosa; Nasal Swab  Result Value Ref Range Status   MRSA by PCR Next Gen NOT  DETECTED NOT DETECTED Final    Comment: (NOTE) The GeneXpert MRSA Assay (FDA approved for NASAL specimens only), is one component of a comprehensive MRSA colonization surveillance program. It is not intended to diagnose MRSA infection nor to guide or monitor treatment for MRSA infections. Test performance is not FDA approved in patients less than 61 years old. Performed at [facility], [place] [address] [place], [place] [address]      Labs: BNP (last 3 results) No results for input(s): "BNP" in the last 8760 hours. Basic Metabolic Panel: Recent Labs  Lab 08/17/21 1410 08/17/21 1421 08/17/21 2150 08/18/21 0845  NA 131*  --   --  133*  K 3.3*  --   --  3.1*  CL 100  --   --  106  CO2 20*  --   --  19*  GLUCOSE 85  --   --  96  BUN 39*  --   --  27*  CREATININE 1.08*  --   --  0.81  CALCIUM 8.2*  --   --  7.5*  MG  --  2.3  --  1.9  PHOS  --   --  4.8* 3.5   Liver Function Tests: Recent Labs  Lab 08/17/21 1410 08/18/21 0845  AST 28 26  ALT 27 23  ALKPHOS 90 85  BILITOT 1.1 1.2  PROT 5.4* 4.8*  ALBUMIN 2.8* 2.5*   Recent Labs  Lab 08/17/21 1410  LIPASE 27   Recent Labs  Lab 08/17/21 2150  AMMONIA 27   CBC: Recent Labs  Lab 08/18/21 0845  WBC 1.9*  HGB 6.6*  HCT 19.1*  MCV 99.0  PLT 11*   Cardiac Enzymes: Recent Labs  Lab 08/17/21 2150  CKTOTAL 34*   BNP: Invalid input(s): "POCBNP" CBG: No results for input(s): "GLUCAP" in the last 168 hours. D-Dimer No results for input(s): "DDIMER" in the last 72 hours. Hgb A1c No results for input(s): "HGBA1C" in the last 72 hours. Lipid Profile No results for input(s): "CHOL", "HDL", "LDLCALC", "TRIG", "CHOLHDL", "LDLDIRECT" in the last 72 hours. Thyroid function studies Recent Labs    08/18/21 0845  TSH 7.320*   Anemia work up Recent Labs    08/17/21 2150  VITAMINB12 709  FOLATE 6.8  FERRITIN 1,462*  TIBC 284  IRON 253*   Urinalysis    Component Value Date/Time    COLORURINE YELLOW 08/17/2021 1834   APPEARANCEUR CLEAR 08/17/2021 1834   LABSPEC 1.013 08/17/2021 1834   PHURINE 5.0 08/17/2021 1834   GLUCOSEU NEGATIVE 08/17/2021 1834   HGBUR NEGATIVE 08/17/2021 1834   BILIRUBINUR NEGATIVE 08/17/2021 1834   BILIRUBINUR NEG 09/29/2013 1129   KETONESUR NEGATIVE 08/17/2021 1834   PROTEINUR NEGATIVE 08/17/2021 1834   UROBILINOGEN 0.2 02/28/2015 1518   NITRITE NEGATIVE 08/17/2021 1834  LEUKOCYTESUR NEGATIVE 08/17/2021 1834   Sepsis Labs Recent Labs  Lab 08/18/21 0845  WBC 1.9*   Microbiology Recent Results (from the past 240 hour(s))  MRSA Next Gen by PCR, Nasal     Status: None   Collection Time: 08/18/21  5:17 AM   Specimen: Nasal Mucosa; Nasal Swab  Result Value Ref Range Status   MRSA by PCR Next Gen NOT DETECTED NOT DETECTED Final    Comment: (NOTE) The GeneXpert MRSA Assay (FDA approved for NASAL specimens only), is one component of a comprehensive MRSA colonization surveillance program. It is not intended to diagnose MRSA infection nor to guide or monitor treatment for MRSA infections. Test performance is not FDA approved in patients less than 42 years old. Performed at [facility], [place] [address] [place], [place] [address]     Please note: You were cared for by a hospitalist during your hospital stay. Once you are discharged, your primary care physician will handle any further medical issues. Please note that NO REFILLS for any discharge medications will be authorized once you are discharged, as it is imperative that you return to your primary care physician (or establish a relationship with a primary care physician if you do not have one) for your post hospital discharge needs so that they can reassess your need for medications and monitor your lab values.    Time coordinating discharge: 40 minutes  SIGNED:   Shelly Coss, MD  [facility] 08/20/2021, 8:36 AM Pager [phone]  If 7PM-7AM, please  contact night-coverage [email or web address] Password [email or web address]

## 2021-08-20 NOTE — TOC Transition Note (Addendum)
Transition of Care [facility]) - CM/SW Discharge Note  Patient Details  Name: [name] MRN: [id number] Date of Birth: Feb 10, 1965  Transition of Care [facility]) CM/SW Contact:  Sherie Don, LCSW Phone Number: 08/20/2021, 1:14 PM  Clinical Narrative: CSW notified patient has a bed at [facility]. Consents have been completed by family. The number for report is [phone]. Discharge packet completed. Medical necessity form done; PTAR scheduled. TOC signing off.  Addendum: Patient no longer wants to go to [facility], but does not meet criteria for a hospital death. Patient and daughter are no longer in agreement about the discharge plan. CSW called PTAR to cancel transportation. TOC to follow for changes in discharge plan.  Final next level of care: [place] Barriers to Discharge: No Barriers Identified  Patient Goals and CMS Choice Patient states their goals for this hospitalization and ongoing recovery are:: Discharge to [facility] Medicare.gov Compare Post Acute Care list provided to:: Patient Represenative (must comment) Choice offered to / list presented to : Adult Children  Discharge Placement        Patient chooses bed at: Other - please specify in the comment section below: ([facility]) Patient to be transferred to facility by: PTAR Patient and family notified of of transfer: 08/20/21  Discharge Plan and Services Discharge Planning Services: CM Consult       DME Arranged: N/A DME Agency: NA  Readmission Risk Interventions     No data to display

## 2021-08-20 NOTE — Progress Notes (Signed)
Manufacturing engineer [facility])  Patient was approved for residential hospice with plans to d/c today.  [name] has now decided she does not want to go to [facility].  ACC will f/u with hospital team tomorrow to see if any decisions have been made.  Venia Carbon DNP, RN [facility] Liaison

## 2021-08-20 NOTE — Progress Notes (Signed)
[facility] - Manufacturing engineer [facility]) Hospice liaison note:  Bed has become available at [facility] place, and family is agreeable to transfer today. TOC has been made aware.  RN please call report to [phone] prior to patient leaving the unit.  Please send signed and completed DNR with patient at discharge.  Thank you,   Zigmund Gottron RN  [facility] liaison  [phone]

## 2021-08-21 NOTE — Progress Notes (Signed)
Patient seen and examined at the bedside this morning.  Husband at the bedside.  Patient and family agreeable for transfer to residential hospice today.  No change in the medical management, discharge summary and orders are already in place.  TOC, hospice and hospice agency following.

## 2021-08-21 NOTE — TOC Progression Note (Signed)
Transition of Care [facility]) - Progression Note   Patient Details  Name: [name] MRN: [id number] Date of Birth: 09/09/1965  Transition of Care [facility]) CM/SW [place], LCSW Phone Number: 08/21/2021, 2:01 PM  Clinical Narrative: [name] has been scheduled for transportation to [facility] today. TOC signing off.  Expected Discharge Plan: [place] Barriers to Discharge: No Barriers Identified  Expected Discharge Plan and Services Expected Discharge Plan: [place] Discharge Planning Services: CM Consult Expected Discharge Date: 08/20/21               DME Arranged: N/A DME Agency: NA  Readmission Risk Interventions     No data to display

## 2021-08-21 NOTE — Progress Notes (Signed)
Pt discharged to [facility]. Left unit on stretcher pushed by ambulance staff. Left in stable condition. Report called and given to Susie, nurse at [facility]. All of nurse's questions answered to her satisfaction.

## 2021-09-12 DEATH — deceased

## 2022-01-16 ENCOUNTER — Other Ambulatory Visit (HOSPITAL_COMMUNITY): Payer: Self-pay

## 2022-12-02 NOTE — Telephone Encounter (Signed)
Telephone call  

## 2022-12-03 NOTE — Telephone Encounter (Signed)
Telephone call
